# Patient Record
Sex: Male | Born: 1935 | Race: White | Hispanic: No | Smoking: Former smoker
Health system: Southern US, Community
[De-identification: ages and names within clinical notes are randomized; demographics above are authoritative.]

## PROBLEM LIST (undated history)

## (undated) DIAGNOSIS — Z972 Presence of dental prosthetic device (complete) (partial): Secondary | ICD-10-CM

## (undated) DIAGNOSIS — R0600 Dyspnea, unspecified: Secondary | ICD-10-CM

## (undated) DIAGNOSIS — I4891 Unspecified atrial fibrillation: Secondary | ICD-10-CM

## (undated) DIAGNOSIS — E119 Type 2 diabetes mellitus without complications: Secondary | ICD-10-CM

## (undated) DIAGNOSIS — J449 Chronic obstructive pulmonary disease, unspecified: Secondary | ICD-10-CM

## (undated) DIAGNOSIS — F419 Anxiety disorder, unspecified: Secondary | ICD-10-CM

## (undated) DIAGNOSIS — N189 Chronic kidney disease, unspecified: Secondary | ICD-10-CM

## (undated) DIAGNOSIS — N200 Calculus of kidney: Secondary | ICD-10-CM

## (undated) DIAGNOSIS — I471 Supraventricular tachycardia, unspecified: Secondary | ICD-10-CM

## (undated) DIAGNOSIS — Z923 Personal history of irradiation: Secondary | ICD-10-CM

## (undated) DIAGNOSIS — Z87442 Personal history of urinary calculi: Secondary | ICD-10-CM

## (undated) DIAGNOSIS — R350 Frequency of micturition: Secondary | ICD-10-CM

## (undated) DIAGNOSIS — E785 Hyperlipidemia, unspecified: Secondary | ICD-10-CM

## (undated) DIAGNOSIS — I10 Essential (primary) hypertension: Secondary | ICD-10-CM

## (undated) DIAGNOSIS — D500 Iron deficiency anemia secondary to blood loss (chronic): Principal | ICD-10-CM

## (undated) DIAGNOSIS — R7989 Other specified abnormal findings of blood chemistry: Secondary | ICD-10-CM

## (undated) DIAGNOSIS — G4733 Obstructive sleep apnea (adult) (pediatric): Secondary | ICD-10-CM

## (undated) DIAGNOSIS — R519 Headache, unspecified: Secondary | ICD-10-CM

## (undated) DIAGNOSIS — I2510 Atherosclerotic heart disease of native coronary artery without angina pectoris: Secondary | ICD-10-CM

## (undated) DIAGNOSIS — Z8709 Personal history of other diseases of the respiratory system: Secondary | ICD-10-CM

## (undated) DIAGNOSIS — D631 Anemia in chronic kidney disease: Secondary | ICD-10-CM

## (undated) DIAGNOSIS — E1142 Type 2 diabetes mellitus with diabetic polyneuropathy: Secondary | ICD-10-CM

## (undated) DIAGNOSIS — K219 Gastro-esophageal reflux disease without esophagitis: Secondary | ICD-10-CM

## (undated) DIAGNOSIS — G2581 Restless legs syndrome: Secondary | ICD-10-CM

## (undated) DIAGNOSIS — C8589 Other specified types of non-Hodgkin lymphoma, extranodal and solid organ sites: Secondary | ICD-10-CM

## (undated) DIAGNOSIS — M1990 Unspecified osteoarthritis, unspecified site: Secondary | ICD-10-CM

## (undated) DIAGNOSIS — R51 Headache: Secondary | ICD-10-CM

## (undated) DIAGNOSIS — I499 Cardiac arrhythmia, unspecified: Secondary | ICD-10-CM

## (undated) HISTORY — DX: Anemia in chronic kidney disease: D63.1

## (undated) HISTORY — DX: Dyspnea, unspecified: R06.00

## (undated) HISTORY — PX: ROTATOR CUFF REPAIR: SHX139

## (undated) HISTORY — DX: Unspecified osteoarthritis, unspecified site: M19.90

## (undated) HISTORY — PX: OTHER SURGICAL HISTORY: SHX169

## (undated) HISTORY — DX: Restless legs syndrome: G25.81

## (undated) HISTORY — DX: Calculus of kidney: N20.0

## (undated) HISTORY — PX: APPENDECTOMY: SHX54

## (undated) HISTORY — DX: Supraventricular tachycardia, unspecified: I47.10

## (undated) HISTORY — DX: Essential (primary) hypertension: I10

## (undated) HISTORY — DX: Type 2 diabetes mellitus without complications: E11.9

## (undated) HISTORY — DX: Other specified abnormal findings of blood chemistry: R79.89

## (undated) HISTORY — PX: CHOLECYSTECTOMY: SHX55

## (undated) HISTORY — PX: BACK SURGERY: SHX140

## (undated) HISTORY — DX: Gastro-esophageal reflux disease without esophagitis: K21.9

## (undated) HISTORY — DX: Supraventricular tachycardia: I47.1

## (undated) HISTORY — PX: MULTIPLE TOOTH EXTRACTIONS: SHX2053

## (undated) HISTORY — DX: Chronic kidney disease, unspecified: N18.9

## (undated) HISTORY — PX: CATARACT EXTRACTION, BILATERAL: SHX1313

## (undated) HISTORY — DX: Other specified types of non-hodgkin lymphoma, extranodal and solid organ sites: C85.89

## (undated) HISTORY — DX: Unspecified atrial fibrillation: I48.91

## (undated) HISTORY — DX: Hyperlipidemia, unspecified: E78.5

## (undated) HISTORY — DX: Obstructive sleep apnea (adult) (pediatric): G47.33

## (undated) HISTORY — DX: Atherosclerotic heart disease of native coronary artery without angina pectoris: I25.10

## (undated) HISTORY — PX: COLONOSCOPY: SHX174

## (undated) HISTORY — DX: Iron deficiency anemia secondary to blood loss (chronic): D50.0

## (undated) HISTORY — DX: Chronic obstructive pulmonary disease, unspecified: J44.9

## (undated) HISTORY — PX: EYE SURGERY: SHX253

---

## 1997-12-12 ENCOUNTER — Encounter: Admission: RE | Admit: 1997-12-12 | Discharge: 1998-03-12 | Payer: Self-pay | Admitting: Internal Medicine

## 1999-07-05 ENCOUNTER — Encounter: Payer: Self-pay | Admitting: Emergency Medicine

## 1999-07-06 ENCOUNTER — Encounter (INDEPENDENT_AMBULATORY_CARE_PROVIDER_SITE_OTHER): Payer: Self-pay | Admitting: Specialist

## 1999-07-06 ENCOUNTER — Encounter: Payer: Self-pay | Admitting: Emergency Medicine

## 1999-07-06 ENCOUNTER — Inpatient Hospital Stay (HOSPITAL_COMMUNITY): Admission: EM | Admit: 1999-07-06 | Discharge: 1999-07-08 | Payer: Self-pay | Admitting: Infectious Diseases

## 2000-12-14 ENCOUNTER — Encounter: Payer: Self-pay | Admitting: Emergency Medicine

## 2000-12-14 ENCOUNTER — Emergency Department (HOSPITAL_COMMUNITY): Admission: EM | Admit: 2000-12-14 | Discharge: 2000-12-14 | Payer: Self-pay | Admitting: Emergency Medicine

## 2001-05-27 ENCOUNTER — Encounter: Payer: Self-pay | Admitting: Sports Medicine

## 2001-05-27 ENCOUNTER — Ambulatory Visit (HOSPITAL_COMMUNITY): Admission: RE | Admit: 2001-05-27 | Discharge: 2001-05-27 | Payer: Self-pay | Admitting: Sports Medicine

## 2001-07-15 ENCOUNTER — Ambulatory Visit (HOSPITAL_BASED_OUTPATIENT_CLINIC_OR_DEPARTMENT_OTHER): Admission: RE | Admit: 2001-07-15 | Discharge: 2001-07-16 | Payer: Self-pay | Admitting: Orthopedic Surgery

## 2002-01-07 HISTORY — PX: CORONARY ANGIOPLASTY: SHX604

## 2002-03-24 ENCOUNTER — Inpatient Hospital Stay (HOSPITAL_COMMUNITY): Admission: EM | Admit: 2002-03-24 | Discharge: 2002-03-27 | Payer: Self-pay | Admitting: Emergency Medicine

## 2002-03-24 ENCOUNTER — Encounter: Payer: Self-pay | Admitting: Emergency Medicine

## 2002-04-19 ENCOUNTER — Encounter (HOSPITAL_COMMUNITY): Admission: RE | Admit: 2002-04-19 | Discharge: 2002-07-18 | Payer: Self-pay | Admitting: Internal Medicine

## 2002-07-20 ENCOUNTER — Encounter (HOSPITAL_COMMUNITY): Admission: RE | Admit: 2002-07-20 | Discharge: 2002-10-18 | Payer: Self-pay | Admitting: Cardiology

## 2002-11-08 ENCOUNTER — Encounter (HOSPITAL_COMMUNITY): Admission: RE | Admit: 2002-11-08 | Discharge: 2002-12-08 | Payer: Self-pay | Admitting: Cardiology

## 2003-02-24 ENCOUNTER — Ambulatory Visit (HOSPITAL_BASED_OUTPATIENT_CLINIC_OR_DEPARTMENT_OTHER): Admission: RE | Admit: 2003-02-24 | Discharge: 2003-02-24 | Payer: Self-pay | Admitting: Orthopedic Surgery

## 2003-02-24 ENCOUNTER — Ambulatory Visit (HOSPITAL_COMMUNITY): Admission: RE | Admit: 2003-02-24 | Discharge: 2003-02-24 | Payer: Self-pay | Admitting: Orthopedic Surgery

## 2003-03-06 ENCOUNTER — Emergency Department (HOSPITAL_COMMUNITY): Admission: AD | Admit: 2003-03-06 | Discharge: 2003-03-06 | Payer: Self-pay | Admitting: Family Medicine

## 2003-04-27 ENCOUNTER — Ambulatory Visit (HOSPITAL_BASED_OUTPATIENT_CLINIC_OR_DEPARTMENT_OTHER): Admission: RE | Admit: 2003-04-27 | Discharge: 2003-04-27 | Payer: Self-pay | Admitting: Orthopedic Surgery

## 2003-04-27 ENCOUNTER — Ambulatory Visit (HOSPITAL_COMMUNITY): Admission: RE | Admit: 2003-04-27 | Discharge: 2003-04-27 | Payer: Self-pay | Admitting: Orthopedic Surgery

## 2003-11-14 ENCOUNTER — Ambulatory Visit: Payer: Self-pay | Admitting: Internal Medicine

## 2003-12-19 ENCOUNTER — Ambulatory Visit: Payer: Self-pay | Admitting: Cardiology

## 2003-12-27 ENCOUNTER — Ambulatory Visit: Payer: Self-pay | Admitting: Internal Medicine

## 2004-01-05 ENCOUNTER — Ambulatory Visit: Payer: Self-pay

## 2004-01-05 ENCOUNTER — Ambulatory Visit: Payer: Self-pay | Admitting: Cardiology

## 2004-01-11 ENCOUNTER — Ambulatory Visit (HOSPITAL_BASED_OUTPATIENT_CLINIC_OR_DEPARTMENT_OTHER): Admission: RE | Admit: 2004-01-11 | Discharge: 2004-01-11 | Payer: Self-pay | Admitting: Cardiology

## 2004-01-11 ENCOUNTER — Ambulatory Visit: Payer: Self-pay | Admitting: Pulmonary Disease

## 2004-08-06 ENCOUNTER — Ambulatory Visit: Payer: Self-pay | Admitting: Internal Medicine

## 2004-09-20 ENCOUNTER — Ambulatory Visit: Payer: Self-pay | Admitting: Internal Medicine

## 2004-10-04 ENCOUNTER — Ambulatory Visit: Payer: Self-pay | Admitting: Internal Medicine

## 2004-10-05 ENCOUNTER — Ambulatory Visit: Payer: Self-pay

## 2004-10-12 ENCOUNTER — Ambulatory Visit: Payer: Self-pay | Admitting: Internal Medicine

## 2004-10-22 ENCOUNTER — Ambulatory Visit: Payer: Self-pay | Admitting: Internal Medicine

## 2004-10-30 ENCOUNTER — Emergency Department (HOSPITAL_COMMUNITY): Admission: EM | Admit: 2004-10-30 | Discharge: 2004-10-31 | Payer: Self-pay | Admitting: Emergency Medicine

## 2004-11-01 ENCOUNTER — Encounter: Admission: RE | Admit: 2004-11-01 | Discharge: 2004-11-01 | Payer: Self-pay | Admitting: Urology

## 2004-11-02 ENCOUNTER — Ambulatory Visit (HOSPITAL_BASED_OUTPATIENT_CLINIC_OR_DEPARTMENT_OTHER): Admission: RE | Admit: 2004-11-02 | Discharge: 2004-11-02 | Payer: Self-pay | Admitting: Urology

## 2004-11-08 ENCOUNTER — Ambulatory Visit (HOSPITAL_COMMUNITY): Admission: RE | Admit: 2004-11-08 | Discharge: 2004-11-08 | Payer: Self-pay | Admitting: Urology

## 2004-11-19 ENCOUNTER — Ambulatory Visit: Payer: Self-pay | Admitting: Internal Medicine

## 2004-11-28 ENCOUNTER — Ambulatory Visit: Payer: Self-pay | Admitting: Internal Medicine

## 2004-12-24 ENCOUNTER — Ambulatory Visit: Payer: Self-pay

## 2004-12-24 ENCOUNTER — Ambulatory Visit: Payer: Self-pay | Admitting: Cardiology

## 2005-01-15 ENCOUNTER — Ambulatory Visit: Payer: Self-pay | Admitting: Internal Medicine

## 2005-04-09 ENCOUNTER — Ambulatory Visit: Payer: Self-pay | Admitting: Internal Medicine

## 2005-05-27 ENCOUNTER — Ambulatory Visit: Payer: Self-pay | Admitting: Internal Medicine

## 2005-07-05 ENCOUNTER — Ambulatory Visit: Payer: Self-pay | Admitting: Cardiology

## 2005-07-22 ENCOUNTER — Ambulatory Visit: Payer: Self-pay | Admitting: Internal Medicine

## 2005-07-29 ENCOUNTER — Ambulatory Visit: Payer: Self-pay | Admitting: Internal Medicine

## 2005-08-28 ENCOUNTER — Ambulatory Visit: Payer: Self-pay | Admitting: Internal Medicine

## 2005-08-29 ENCOUNTER — Encounter: Admission: RE | Admit: 2005-08-29 | Discharge: 2005-08-29 | Payer: Self-pay | Admitting: Internal Medicine

## 2005-08-30 ENCOUNTER — Ambulatory Visit: Payer: Self-pay | Admitting: Internal Medicine

## 2005-09-03 ENCOUNTER — Ambulatory Visit: Payer: Self-pay | Admitting: Cardiology

## 2005-09-04 ENCOUNTER — Ambulatory Visit: Payer: Self-pay | Admitting: Cardiology

## 2005-09-12 ENCOUNTER — Ambulatory Visit: Payer: Self-pay | Admitting: Internal Medicine

## 2005-09-30 ENCOUNTER — Ambulatory Visit: Payer: Self-pay | Admitting: Internal Medicine

## 2005-11-25 ENCOUNTER — Ambulatory Visit: Payer: Self-pay | Admitting: Internal Medicine

## 2005-12-24 ENCOUNTER — Ambulatory Visit: Payer: Self-pay

## 2005-12-25 ENCOUNTER — Ambulatory Visit: Payer: Self-pay | Admitting: Cardiology

## 2005-12-27 ENCOUNTER — Ambulatory Visit: Payer: Self-pay | Admitting: Internal Medicine

## 2006-01-09 ENCOUNTER — Ambulatory Visit: Payer: Self-pay

## 2006-01-24 ENCOUNTER — Ambulatory Visit: Payer: Self-pay | Admitting: Cardiology

## 2006-02-05 ENCOUNTER — Ambulatory Visit: Payer: Self-pay | Admitting: Internal Medicine

## 2006-02-26 ENCOUNTER — Ambulatory Visit: Payer: Self-pay | Admitting: Internal Medicine

## 2006-03-06 ENCOUNTER — Ambulatory Visit: Payer: Self-pay | Admitting: Critical Care Medicine

## 2006-03-17 ENCOUNTER — Ambulatory Visit: Payer: Self-pay | Admitting: Critical Care Medicine

## 2006-03-25 ENCOUNTER — Ambulatory Visit: Payer: Self-pay | Admitting: Critical Care Medicine

## 2006-04-08 ENCOUNTER — Ambulatory Visit: Payer: Self-pay | Admitting: Critical Care Medicine

## 2006-05-01 ENCOUNTER — Ambulatory Visit: Payer: Self-pay | Admitting: Internal Medicine

## 2006-05-01 LAB — CONVERTED CEMR LAB: Hgb A1c MFr Bld: 7.9 % — ABNORMAL HIGH (ref 4.6–6.0)

## 2006-05-13 ENCOUNTER — Ambulatory Visit: Payer: Self-pay | Admitting: Cardiology

## 2006-05-13 ENCOUNTER — Inpatient Hospital Stay (HOSPITAL_COMMUNITY): Admission: EM | Admit: 2006-05-13 | Discharge: 2006-05-15 | Payer: Self-pay | Admitting: Family Medicine

## 2006-05-20 ENCOUNTER — Ambulatory Visit: Payer: Self-pay | Admitting: Cardiology

## 2006-05-29 ENCOUNTER — Ambulatory Visit: Payer: Self-pay | Admitting: Critical Care Medicine

## 2006-06-09 ENCOUNTER — Ambulatory Visit: Payer: Self-pay | Admitting: Cardiology

## 2006-06-09 LAB — CONVERTED CEMR LAB
BUN: 10 mg/dL (ref 6–23)
CO2: 24 meq/L (ref 19–32)
Calcium: 9.5 mg/dL (ref 8.4–10.5)
Chloride: 106 meq/L (ref 96–112)
Creatinine, Ser: 1 mg/dL (ref 0.4–1.5)
GFR calc Af Amer: 95 mL/min
GFR calc non Af Amer: 78 mL/min
Glucose, Bld: 267 mg/dL — ABNORMAL HIGH (ref 70–99)
Potassium: 4.6 meq/L (ref 3.5–5.1)
Sodium: 136 meq/L (ref 135–145)

## 2006-06-16 ENCOUNTER — Ambulatory Visit: Payer: Self-pay | Admitting: Internal Medicine

## 2006-06-20 ENCOUNTER — Ambulatory Visit: Payer: Self-pay | Admitting: Internal Medicine

## 2006-06-20 LAB — CONVERTED CEMR LAB
BUN: 11 mg/dL (ref 6–23)
CO2: 25 meq/L (ref 19–32)
Calcium: 9.5 mg/dL (ref 8.4–10.5)
Chloride: 107 meq/L (ref 96–112)
Creatinine, Ser: 1.1 mg/dL (ref 0.4–1.5)
GFR calc Af Amer: 85 mL/min
GFR calc non Af Amer: 70 mL/min
Glucose, Bld: 84 mg/dL (ref 70–99)
Potassium: 4.1 meq/L (ref 3.5–5.1)
Sodium: 137 meq/L (ref 135–145)

## 2006-07-01 DIAGNOSIS — E782 Mixed hyperlipidemia: Secondary | ICD-10-CM | POA: Insufficient documentation

## 2006-07-01 DIAGNOSIS — I10 Essential (primary) hypertension: Secondary | ICD-10-CM | POA: Insufficient documentation

## 2006-07-01 DIAGNOSIS — I2510 Atherosclerotic heart disease of native coronary artery without angina pectoris: Secondary | ICD-10-CM | POA: Insufficient documentation

## 2006-07-01 DIAGNOSIS — J449 Chronic obstructive pulmonary disease, unspecified: Secondary | ICD-10-CM

## 2006-07-01 DIAGNOSIS — M1990 Unspecified osteoarthritis, unspecified site: Secondary | ICD-10-CM | POA: Insufficient documentation

## 2006-07-08 ENCOUNTER — Ambulatory Visit: Payer: Self-pay | Admitting: Critical Care Medicine

## 2006-07-10 ENCOUNTER — Ambulatory Visit: Payer: Self-pay | Admitting: Internal Medicine

## 2006-07-16 ENCOUNTER — Ambulatory Visit: Payer: Self-pay | Admitting: Cardiology

## 2006-08-18 ENCOUNTER — Encounter: Payer: Self-pay | Admitting: Internal Medicine

## 2006-09-15 ENCOUNTER — Ambulatory Visit: Payer: Self-pay | Admitting: Internal Medicine

## 2006-10-01 ENCOUNTER — Ambulatory Visit: Payer: Self-pay | Admitting: Critical Care Medicine

## 2006-11-01 ENCOUNTER — Encounter: Admission: RE | Admit: 2006-11-01 | Discharge: 2006-11-01 | Payer: Self-pay | Admitting: Sports Medicine

## 2006-12-29 ENCOUNTER — Ambulatory Visit: Payer: Self-pay | Admitting: Internal Medicine

## 2006-12-29 LAB — CONVERTED CEMR LAB
ALT: 31 units/L (ref 0–53)
AST: 52 units/L — ABNORMAL HIGH (ref 0–37)
Albumin: 3.6 g/dL (ref 3.5–5.2)
Alkaline Phosphatase: 66 units/L (ref 39–117)
BUN: 11 mg/dL (ref 6–23)
Basophils Absolute: 0.2 10*3/uL — ABNORMAL HIGH (ref 0.0–0.1)
Basophils Relative: 2.5 % — ABNORMAL HIGH (ref 0.0–1.0)
Bilirubin Urine: NEGATIVE
Bilirubin, Direct: 0.2 mg/dL (ref 0.0–0.3)
CO2: 26 meq/L (ref 19–32)
Calcium: 9.4 mg/dL (ref 8.4–10.5)
Chloride: 104 meq/L (ref 96–112)
Cholesterol: 214 mg/dL (ref 0–200)
Creatinine, Ser: 1 mg/dL (ref 0.4–1.5)
Creatinine,U: 189.3 mg/dL
Direct LDL: 109.5 mg/dL
Eosinophils Absolute: 0.2 10*3/uL (ref 0.0–0.6)
Eosinophils Relative: 3.8 % (ref 0.0–5.0)
GFR calc Af Amer: 95 mL/min
GFR calc non Af Amer: 78 mL/min
Glucose, Bld: 188 mg/dL — ABNORMAL HIGH (ref 70–99)
Glucose, Urine, Semiquant: NEGATIVE
HCT: 40.6 % (ref 39.0–52.0)
HDL: 31.7 mg/dL — ABNORMAL LOW (ref >39.0)
Hemoglobin: 14.1 g/dL (ref 13.0–17.0)
Hgb A1c MFr Bld: 8.4 % — ABNORMAL HIGH (ref 4.6–6.0)
Ketones, urine, test strip: NEGATIVE
Lymphocytes Relative: 38.2 % (ref 12.0–46.0)
MCHC: 34.8 g/dL (ref 30.0–36.0)
MCV: 93.9 fL (ref 78.0–100.0)
Microalb Creat Ratio: 54.9 mg/g — ABNORMAL HIGH (ref 0.0–30.0)
Microalb, Ur: 10.4 mg/dL — ABNORMAL HIGH (ref 0.0–1.9)
Monocytes Absolute: 0.6 10*3/uL (ref 0.2–0.7)
Monocytes Relative: 9.9 % (ref 3.0–11.0)
Neutro Abs: 2.9 10*3/uL (ref 1.4–7.7)
Neutrophils Relative %: 45.6 % (ref 43.0–77.0)
Nitrite: NEGATIVE
Platelets: 210 10*3/uL (ref 150–400)
Potassium: 4.2 meq/L (ref 3.5–5.1)
RBC: 4.32 M/uL (ref 4.22–5.81)
RDW: 13.4 % (ref 11.5–14.6)
Sodium: 139 meq/L (ref 135–145)
Specific Gravity, Urine: >=1.03
TSH: 3.28 microintl units/mL (ref 0.35–5.50)
Total Bilirubin: 0.6 mg/dL (ref 0.3–1.2)
Total CHOL/HDL Ratio: 6.8
Total Protein: 6.5 g/dL (ref 6.0–8.3)
Triglycerides: 459 mg/dL (ref 0–149)
Urobilinogen, UA: 0.2
VLDL: 92 mg/dL — ABNORMAL HIGH (ref 0–40)
WBC Urine, dipstick: NEGATIVE
WBC: 6.3 10*3/uL (ref 4.5–10.5)
pH: 5.5

## 2007-01-05 ENCOUNTER — Ambulatory Visit: Payer: Self-pay

## 2007-01-05 ENCOUNTER — Ambulatory Visit: Payer: Self-pay | Admitting: Internal Medicine

## 2007-01-05 LAB — CONVERTED CEMR LAB
Cholesterol, target level: 200 mg/dL
HDL goal, serum: 40 mg/dL
LDL Goal: 100 mg/dL

## 2007-01-12 ENCOUNTER — Ambulatory Visit: Payer: Self-pay | Admitting: Critical Care Medicine

## 2007-01-12 DIAGNOSIS — K219 Gastro-esophageal reflux disease without esophagitis: Secondary | ICD-10-CM | POA: Insufficient documentation

## 2007-01-14 ENCOUNTER — Ambulatory Visit: Payer: Self-pay | Admitting: Cardiology

## 2007-01-20 ENCOUNTER — Encounter (HOSPITAL_COMMUNITY): Admission: RE | Admit: 2007-01-20 | Discharge: 2007-04-02 | Payer: Self-pay | Admitting: Critical Care Medicine

## 2007-01-28 ENCOUNTER — Telehealth: Payer: Self-pay | Admitting: Internal Medicine

## 2007-03-02 ENCOUNTER — Ambulatory Visit: Payer: Self-pay | Admitting: Internal Medicine

## 2007-03-02 LAB — CONVERTED CEMR LAB
ALT: 17 units/L (ref 0–53)
AST: 26 units/L (ref 0–37)
Albumin: 3.6 g/dL (ref 3.5–5.2)
Alkaline Phosphatase: 48 units/L (ref 39–117)
Bilirubin, Direct: 0.2 mg/dL (ref 0.0–0.3)
Cholesterol: 186 mg/dL (ref 0–200)
Direct LDL: 97.1 mg/dL
HDL: 32.3 mg/dL — ABNORMAL LOW (ref >39.0)
Total Bilirubin: 0.8 mg/dL (ref 0.3–1.2)
Total CHOL/HDL Ratio: 5.8
Total Protein: 6.7 g/dL (ref 6.0–8.3)
Triglycerides: 313 mg/dL (ref 0–149)
VLDL: 63 mg/dL — ABNORMAL HIGH (ref 0–40)

## 2007-03-12 ENCOUNTER — Telehealth: Payer: Self-pay | Admitting: Internal Medicine

## 2007-04-08 ENCOUNTER — Encounter (HOSPITAL_COMMUNITY): Admission: RE | Admit: 2007-04-08 | Discharge: 2007-07-07 | Payer: Self-pay | Admitting: Critical Care Medicine

## 2007-05-08 ENCOUNTER — Encounter: Payer: Self-pay | Admitting: Internal Medicine

## 2007-05-08 ENCOUNTER — Telehealth: Payer: Self-pay | Admitting: Internal Medicine

## 2007-05-11 ENCOUNTER — Telehealth: Payer: Self-pay | Admitting: Internal Medicine

## 2007-06-16 ENCOUNTER — Ambulatory Visit: Payer: Self-pay | Admitting: Internal Medicine

## 2007-06-16 LAB — CONVERTED CEMR LAB: Hgb A1c MFr Bld: 6.7 % — ABNORMAL HIGH (ref 4.6–6.0)

## 2007-06-24 ENCOUNTER — Ambulatory Visit: Payer: Self-pay | Admitting: Critical Care Medicine

## 2007-07-06 ENCOUNTER — Ambulatory Visit: Payer: Self-pay | Admitting: Cardiology

## 2007-07-08 ENCOUNTER — Encounter (HOSPITAL_COMMUNITY): Admission: RE | Admit: 2007-07-08 | Discharge: 2007-10-06 | Payer: Self-pay | Admitting: Critical Care Medicine

## 2007-07-15 ENCOUNTER — Encounter: Payer: Self-pay | Admitting: Internal Medicine

## 2007-07-15 ENCOUNTER — Ambulatory Visit: Payer: Self-pay

## 2007-07-15 ENCOUNTER — Encounter: Payer: Self-pay | Admitting: Cardiology

## 2007-07-16 ENCOUNTER — Ambulatory Visit: Payer: Self-pay

## 2007-07-17 ENCOUNTER — Telehealth: Payer: Self-pay | Admitting: Internal Medicine

## 2007-07-21 ENCOUNTER — Telehealth (INDEPENDENT_AMBULATORY_CARE_PROVIDER_SITE_OTHER): Payer: Self-pay | Admitting: *Deleted

## 2007-07-31 ENCOUNTER — Ambulatory Visit: Payer: Self-pay | Admitting: Cardiovascular Disease

## 2007-07-31 LAB — CONVERTED CEMR LAB
BUN: 18 mg/dL (ref 6–23)
Basophils Absolute: 0 10*3/uL (ref 0.0–0.1)
Basophils Relative: 0.4 % (ref 0.0–3.0)
CO2: 30 meq/L (ref 19–32)
CRP, High Sensitivity: 1 (ref 0.00–5.00)
Calcium: 10 mg/dL (ref 8.4–10.5)
Chloride: 98 meq/L (ref 96–112)
Creatinine, Ser: 1.2 mg/dL (ref 0.4–1.5)
Eosinophils Absolute: 0.2 10*3/uL (ref 0.0–0.7)
Eosinophils Relative: 2.7 % (ref 0.0–5.0)
GFR calc Af Amer: 77 mL/min
GFR calc non Af Amer: 63 mL/min
Glucose, Bld: 109 mg/dL — ABNORMAL HIGH (ref 70–99)
HCT: 40.5 % (ref 39.0–52.0)
Hemoglobin: 14.1 g/dL (ref 13.0–17.0)
Lymphocytes Relative: 37.7 % (ref 12.0–46.0)
MCHC: 34.8 g/dL (ref 30.0–36.0)
MCV: 92.8 fL (ref 78.0–100.0)
Monocytes Absolute: 0.6 10*3/uL (ref 0.1–1.0)
Monocytes Relative: 7.1 % (ref 3.0–12.0)
Neutro Abs: 4.1 10*3/uL (ref 1.4–7.7)
Neutrophils Relative %: 52.1 % (ref 43.0–77.0)
Platelets: 223 10*3/uL (ref 150–400)
Potassium: 4.1 meq/L (ref 3.5–5.1)
RBC: 4.36 M/uL (ref 4.22–5.81)
RDW: 12.9 % (ref 11.5–14.6)
Sodium: 136 meq/L (ref 135–145)
TSH: 2.74 microintl units/mL (ref 0.35–5.50)
Total CK: 109 units/L (ref 7–195)
WBC: 7.8 10*3/uL (ref 4.5–10.5)

## 2007-08-07 ENCOUNTER — Encounter: Payer: Self-pay | Admitting: Critical Care Medicine

## 2007-08-13 ENCOUNTER — Ambulatory Visit: Payer: Self-pay | Admitting: Cardiovascular Disease

## 2007-08-17 ENCOUNTER — Ambulatory Visit: Payer: Self-pay | Admitting: Internal Medicine

## 2007-08-28 ENCOUNTER — Ambulatory Visit: Payer: Self-pay | Admitting: Cardiovascular Disease

## 2007-08-28 LAB — CONVERTED CEMR LAB
BUN: 28 mg/dL — ABNORMAL HIGH (ref 6–23)
CO2: 29 meq/L (ref 19–32)
Calcium: 9.4 mg/dL (ref 8.4–10.5)
Chloride: 96 meq/L (ref 96–112)
Creatinine, Ser: 1.4 mg/dL (ref 0.4–1.5)
GFR calc Af Amer: 64 mL/min
GFR calc non Af Amer: 53 mL/min
Glucose, Bld: 195 mg/dL — ABNORMAL HIGH (ref 70–99)
Potassium: 4.3 meq/L (ref 3.5–5.1)
Sodium: 134 meq/L — ABNORMAL LOW (ref 135–145)

## 2007-09-01 ENCOUNTER — Encounter: Payer: Self-pay | Admitting: Critical Care Medicine

## 2007-09-02 ENCOUNTER — Ambulatory Visit: Payer: Self-pay | Admitting: Critical Care Medicine

## 2007-09-22 ENCOUNTER — Ambulatory Visit: Payer: Self-pay | Admitting: Cardiovascular Disease

## 2007-10-01 ENCOUNTER — Telehealth: Payer: Self-pay | Admitting: Internal Medicine

## 2007-10-08 ENCOUNTER — Encounter (HOSPITAL_COMMUNITY): Admission: RE | Admit: 2007-10-08 | Discharge: 2008-01-05 | Payer: Self-pay | Admitting: Critical Care Medicine

## 2007-10-23 ENCOUNTER — Ambulatory Visit: Payer: Self-pay | Admitting: Critical Care Medicine

## 2007-11-09 ENCOUNTER — Encounter: Payer: Self-pay | Admitting: Internal Medicine

## 2007-12-07 ENCOUNTER — Telehealth (INDEPENDENT_AMBULATORY_CARE_PROVIDER_SITE_OTHER): Payer: Self-pay | Admitting: *Deleted

## 2007-12-14 ENCOUNTER — Telehealth: Payer: Self-pay | Admitting: Internal Medicine

## 2007-12-22 ENCOUNTER — Telehealth: Payer: Self-pay | Admitting: Internal Medicine

## 2007-12-24 ENCOUNTER — Ambulatory Visit: Payer: Self-pay | Admitting: Internal Medicine

## 2008-01-08 ENCOUNTER — Encounter (HOSPITAL_COMMUNITY): Admission: RE | Admit: 2008-01-08 | Discharge: 2008-04-07 | Payer: Self-pay | Admitting: Critical Care Medicine

## 2008-01-15 ENCOUNTER — Telehealth: Payer: Self-pay | Admitting: Internal Medicine

## 2008-01-15 ENCOUNTER — Encounter: Payer: Self-pay | Admitting: Internal Medicine

## 2008-01-29 ENCOUNTER — Encounter: Payer: Self-pay | Admitting: Critical Care Medicine

## 2008-02-15 ENCOUNTER — Ambulatory Visit: Payer: Self-pay | Admitting: Critical Care Medicine

## 2008-03-28 ENCOUNTER — Encounter: Payer: Self-pay | Admitting: Cardiovascular Disease

## 2008-03-28 DIAGNOSIS — I119 Hypertensive heart disease without heart failure: Secondary | ICD-10-CM | POA: Insufficient documentation

## 2008-03-28 DIAGNOSIS — I2510 Atherosclerotic heart disease of native coronary artery without angina pectoris: Secondary | ICD-10-CM | POA: Insufficient documentation

## 2008-03-29 ENCOUNTER — Ambulatory Visit: Payer: Self-pay | Admitting: Cardiovascular Disease

## 2008-04-08 ENCOUNTER — Encounter (HOSPITAL_COMMUNITY): Admission: RE | Admit: 2008-04-08 | Discharge: 2008-07-07 | Payer: Self-pay | Admitting: Critical Care Medicine

## 2008-04-13 ENCOUNTER — Encounter (INDEPENDENT_AMBULATORY_CARE_PROVIDER_SITE_OTHER): Payer: Self-pay | Admitting: *Deleted

## 2008-05-16 ENCOUNTER — Ambulatory Visit: Payer: Self-pay | Admitting: Internal Medicine

## 2008-07-21 ENCOUNTER — Ambulatory Visit: Payer: Self-pay | Admitting: Family Medicine

## 2008-07-21 DIAGNOSIS — J36 Peritonsillar abscess: Secondary | ICD-10-CM | POA: Insufficient documentation

## 2008-07-21 DIAGNOSIS — J029 Acute pharyngitis, unspecified: Secondary | ICD-10-CM | POA: Insufficient documentation

## 2008-07-22 ENCOUNTER — Telehealth: Payer: Self-pay | Admitting: Family Medicine

## 2008-08-15 ENCOUNTER — Ambulatory Visit: Payer: Self-pay | Admitting: Internal Medicine

## 2008-08-19 LAB — CONVERTED CEMR LAB
BUN: 17 mg/dL (ref 6–23)
Basophils Absolute: 0 10*3/uL (ref 0.0–0.1)
Basophils Relative: 0.1 % (ref 0.0–3.0)
CO2: 28 meq/L (ref 19–32)
Calcium: 9.5 mg/dL (ref 8.4–10.5)
Chloride: 108 meq/L (ref 96–112)
Creatinine, Ser: 1 mg/dL (ref 0.4–1.5)
Eosinophils Absolute: 0.2 10*3/uL (ref 0.0–0.7)
Eosinophils Relative: 3.9 % (ref 0.0–5.0)
GFR calc non Af Amer: 77.73 mL/min (ref >60)
Glucose, Bld: 223 mg/dL — ABNORMAL HIGH (ref 70–99)
HCT: 37.7 % — ABNORMAL LOW (ref 39.0–52.0)
Hemoglobin: 13.1 g/dL (ref 13.0–17.0)
Hgb A1c MFr Bld: 11.6 % — ABNORMAL HIGH (ref 4.6–6.5)
Lymphocytes Relative: 36.4 % (ref 12.0–46.0)
Lymphs Abs: 2 10*3/uL (ref 0.7–4.0)
MCHC: 34.7 g/dL (ref 30.0–36.0)
MCV: 95.2 fL (ref 78.0–100.0)
Monocytes Absolute: 0.6 10*3/uL (ref 0.1–1.0)
Monocytes Relative: 9.9 % (ref 3.0–12.0)
Neutro Abs: 2.8 10*3/uL (ref 1.4–7.7)
Neutrophils Relative %: 49.7 % (ref 43.0–77.0)
Platelets: 185 10*3/uL (ref 150.0–400.0)
Potassium: 4.2 meq/L (ref 3.5–5.1)
RBC: 3.96 M/uL — ABNORMAL LOW (ref 4.22–5.81)
RDW: 13 % (ref 11.5–14.6)
Sodium: 141 meq/L (ref 135–145)
WBC: 5.6 10*3/uL (ref 4.5–10.5)

## 2008-09-15 ENCOUNTER — Ambulatory Visit: Payer: Self-pay | Admitting: Cardiovascular Disease

## 2008-10-19 ENCOUNTER — Ambulatory Visit: Payer: Self-pay | Admitting: Internal Medicine

## 2008-10-19 DIAGNOSIS — J0100 Acute maxillary sinusitis, unspecified: Secondary | ICD-10-CM | POA: Insufficient documentation

## 2008-11-07 ENCOUNTER — Ambulatory Visit: Payer: Self-pay | Admitting: Internal Medicine

## 2008-11-09 LAB — CONVERTED CEMR LAB
Cholesterol: 247 mg/dL — ABNORMAL HIGH (ref 0–200)
Direct LDL: 138.7 mg/dL
HDL: 28 mg/dL — ABNORMAL LOW (ref >39.00)
Total CHOL/HDL Ratio: 9
Triglycerides: 561 mg/dL — ABNORMAL HIGH (ref 0.0–149.0)
VLDL: 112.2 mg/dL — ABNORMAL HIGH (ref 0.0–40.0)

## 2008-12-07 ENCOUNTER — Ambulatory Visit: Payer: Self-pay | Admitting: Internal Medicine

## 2008-12-14 ENCOUNTER — Encounter (INDEPENDENT_AMBULATORY_CARE_PROVIDER_SITE_OTHER): Payer: Self-pay

## 2008-12-16 ENCOUNTER — Encounter (INDEPENDENT_AMBULATORY_CARE_PROVIDER_SITE_OTHER): Payer: Self-pay

## 2008-12-16 ENCOUNTER — Ambulatory Visit: Payer: Self-pay | Admitting: Gastroenterology

## 2008-12-28 ENCOUNTER — Ambulatory Visit: Payer: Self-pay | Admitting: Gastroenterology

## 2008-12-28 ENCOUNTER — Telehealth: Payer: Self-pay | Admitting: Internal Medicine

## 2009-01-03 ENCOUNTER — Encounter: Payer: Self-pay | Admitting: Gastroenterology

## 2009-01-04 ENCOUNTER — Encounter: Payer: Self-pay | Admitting: Cardiology

## 2009-01-04 DIAGNOSIS — I6529 Occlusion and stenosis of unspecified carotid artery: Secondary | ICD-10-CM | POA: Insufficient documentation

## 2009-01-05 ENCOUNTER — Ambulatory Visit: Payer: Self-pay

## 2009-01-05 ENCOUNTER — Encounter: Payer: Self-pay | Admitting: Internal Medicine

## 2009-01-23 ENCOUNTER — Telehealth: Payer: Self-pay | Admitting: Internal Medicine

## 2009-01-26 ENCOUNTER — Telehealth: Payer: Self-pay | Admitting: *Deleted

## 2009-02-16 ENCOUNTER — Telehealth: Payer: Self-pay | Admitting: Internal Medicine

## 2009-02-21 ENCOUNTER — Ambulatory Visit: Payer: Self-pay | Admitting: Internal Medicine

## 2009-02-21 LAB — CONVERTED CEMR LAB: Hgb A1c MFr Bld: 11.7 % — ABNORMAL HIGH (ref 4.6–6.5)

## 2009-02-27 ENCOUNTER — Ambulatory Visit: Payer: Self-pay | Admitting: Internal Medicine

## 2009-02-28 ENCOUNTER — Telehealth: Payer: Self-pay | Admitting: Internal Medicine

## 2009-03-24 ENCOUNTER — Telehealth: Payer: Self-pay | Admitting: Internal Medicine

## 2009-03-30 ENCOUNTER — Ambulatory Visit: Payer: Self-pay | Admitting: Internal Medicine

## 2009-04-18 ENCOUNTER — Ambulatory Visit: Payer: Self-pay | Admitting: Cardiovascular Disease

## 2009-05-08 ENCOUNTER — Ambulatory Visit: Payer: Self-pay | Admitting: Internal Medicine

## 2009-05-08 LAB — CONVERTED CEMR LAB: Hgb A1c MFr Bld: 8.6 % — ABNORMAL HIGH (ref 4.6–6.5)

## 2009-05-16 ENCOUNTER — Encounter: Payer: Self-pay | Admitting: Internal Medicine

## 2009-05-24 ENCOUNTER — Ambulatory Visit: Payer: Self-pay | Admitting: Internal Medicine

## 2009-05-29 ENCOUNTER — Telehealth: Payer: Self-pay | Admitting: Internal Medicine

## 2009-06-20 ENCOUNTER — Telehealth: Payer: Self-pay | Admitting: Internal Medicine

## 2009-07-08 ENCOUNTER — Encounter: Admission: RE | Admit: 2009-07-08 | Discharge: 2009-07-08 | Payer: Self-pay | Admitting: Sports Medicine

## 2009-07-14 ENCOUNTER — Telehealth: Payer: Self-pay | Admitting: *Deleted

## 2009-07-17 ENCOUNTER — Encounter: Admission: RE | Admit: 2009-07-17 | Discharge: 2009-07-17 | Payer: Self-pay | Admitting: Sports Medicine

## 2009-07-19 ENCOUNTER — Telehealth: Payer: Self-pay | Admitting: Internal Medicine

## 2009-07-31 ENCOUNTER — Encounter: Admission: RE | Admit: 2009-07-31 | Discharge: 2009-07-31 | Payer: Self-pay | Admitting: Sports Medicine

## 2009-08-01 ENCOUNTER — Telehealth: Payer: Self-pay | Admitting: *Deleted

## 2009-08-02 ENCOUNTER — Ambulatory Visit: Payer: Self-pay | Admitting: Family Medicine

## 2009-08-02 DIAGNOSIS — E669 Obesity, unspecified: Secondary | ICD-10-CM | POA: Insufficient documentation

## 2009-08-07 ENCOUNTER — Ambulatory Visit: Payer: Self-pay | Admitting: Internal Medicine

## 2009-08-07 LAB — CONVERTED CEMR LAB: Hgb A1c MFr Bld: 9.8 % — ABNORMAL HIGH (ref 4.6–6.5)

## 2009-08-14 ENCOUNTER — Ambulatory Visit: Payer: Self-pay | Admitting: Family Medicine

## 2009-08-14 DIAGNOSIS — G4700 Insomnia, unspecified: Secondary | ICD-10-CM | POA: Insufficient documentation

## 2009-08-14 DIAGNOSIS — R5383 Other fatigue: Secondary | ICD-10-CM

## 2009-08-14 DIAGNOSIS — R358 Other polyuria: Secondary | ICD-10-CM | POA: Insufficient documentation

## 2009-08-14 DIAGNOSIS — C44309 Unspecified malignant neoplasm of skin of other parts of face: Secondary | ICD-10-CM | POA: Insufficient documentation

## 2009-08-14 DIAGNOSIS — R252 Cramp and spasm: Secondary | ICD-10-CM | POA: Insufficient documentation

## 2009-08-14 DIAGNOSIS — C44300 Unspecified malignant neoplasm of skin of unspecified part of face: Secondary | ICD-10-CM | POA: Insufficient documentation

## 2009-08-14 DIAGNOSIS — R3589 Other polyuria: Secondary | ICD-10-CM | POA: Insufficient documentation

## 2009-08-14 DIAGNOSIS — R5381 Other malaise: Secondary | ICD-10-CM | POA: Insufficient documentation

## 2009-08-14 LAB — CONVERTED CEMR LAB
Creatinine,U: 162.6 mg/dL
Magnesium: 2.1 mg/dL (ref 1.5–2.5)
Microalb Creat Ratio: 4.2 mg/g (ref 0.0–30.0)
Microalb, Ur: 6.9 mg/dL — ABNORMAL HIGH (ref 0.0–1.9)

## 2009-08-15 LAB — CONVERTED CEMR LAB
ALT: 19 units/L (ref 0–53)
AST: 32 units/L (ref 0–37)
Albumin: 3.8 g/dL (ref 3.5–5.2)
Alkaline Phosphatase: 47 units/L (ref 39–117)
BUN: 18 mg/dL (ref 6–23)
Basophils Absolute: 0 10*3/uL (ref 0.0–0.1)
Basophils Relative: 0.6 % (ref 0.0–3.0)
Bilirubin, Direct: 0.1 mg/dL (ref 0.0–0.3)
CO2: 29 meq/L (ref 19–32)
Calcium: 9.7 mg/dL (ref 8.4–10.5)
Chloride: 103 meq/L (ref 96–112)
Cholesterol: 252 mg/dL — ABNORMAL HIGH (ref 0–200)
Creatinine, Ser: 1.3 mg/dL (ref 0.4–1.5)
Direct LDL: 167.1 mg/dL
Eosinophils Absolute: 0.1 10*3/uL (ref 0.0–0.7)
Eosinophils Relative: 1.7 % (ref 0.0–5.0)
GFR calc non Af Amer: 57.27 mL/min (ref >60)
Glucose, Bld: 132 mg/dL — ABNORMAL HIGH (ref 70–99)
HCT: 42.1 % (ref 39.0–52.0)
HDL: 38.2 mg/dL — ABNORMAL LOW (ref >39.00)
Hemoglobin: 14.1 g/dL (ref 13.0–17.0)
Lymphocytes Relative: 30.6 % (ref 12.0–46.0)
Lymphs Abs: 2.6 10*3/uL (ref 0.7–4.0)
MCHC: 33.6 g/dL (ref 30.0–36.0)
MCV: 95.9 fL (ref 78.0–100.0)
Monocytes Absolute: 0.7 10*3/uL (ref 0.1–1.0)
Monocytes Relative: 8.8 % (ref 3.0–12.0)
Neutro Abs: 4.9 10*3/uL (ref 1.4–7.7)
Neutrophils Relative %: 58.3 % (ref 43.0–77.0)
PSA: 0.87 ng/mL (ref 0.10–4.00)
Phosphorus: 2.7 mg/dL (ref 2.3–4.6)
Platelets: 233 10*3/uL (ref 150.0–400.0)
Potassium: 4.4 meq/L (ref 3.5–5.1)
RBC: 4.39 M/uL (ref 4.22–5.81)
RDW: 13.9 % (ref 11.5–14.6)
Sodium: 141 meq/L (ref 135–145)
TSH: 2.6 microintl units/mL (ref 0.35–5.50)
Total Bilirubin: 0.4 mg/dL (ref 0.3–1.2)
Total CHOL/HDL Ratio: 7
Total Protein: 6.7 g/dL (ref 6.0–8.3)
Triglycerides: 328 mg/dL — ABNORMAL HIGH (ref 0.0–149.0)
VLDL: 65.6 mg/dL — ABNORMAL HIGH (ref 0.0–40.0)
WBC: 8.4 10*3/uL (ref 4.5–10.5)

## 2009-09-12 ENCOUNTER — Encounter: Admission: RE | Admit: 2009-09-12 | Discharge: 2009-09-12 | Payer: Self-pay | Admitting: Sports Medicine

## 2009-09-19 ENCOUNTER — Ambulatory Visit: Payer: Self-pay | Admitting: Family Medicine

## 2009-09-19 DIAGNOSIS — M549 Dorsalgia, unspecified: Secondary | ICD-10-CM | POA: Insufficient documentation

## 2009-09-29 ENCOUNTER — Encounter: Payer: Self-pay | Admitting: Family Medicine

## 2009-10-10 ENCOUNTER — Telehealth: Payer: Self-pay | Admitting: Internal Medicine

## 2009-10-11 ENCOUNTER — Ambulatory Visit: Payer: Self-pay | Admitting: Family Medicine

## 2009-10-11 DIAGNOSIS — J209 Acute bronchitis, unspecified: Secondary | ICD-10-CM | POA: Insufficient documentation

## 2009-10-18 ENCOUNTER — Telehealth: Payer: Self-pay | Admitting: Internal Medicine

## 2009-11-16 ENCOUNTER — Ambulatory Visit: Payer: Self-pay | Admitting: Internal Medicine

## 2009-11-16 DIAGNOSIS — J019 Acute sinusitis, unspecified: Secondary | ICD-10-CM | POA: Insufficient documentation

## 2009-12-25 ENCOUNTER — Telehealth: Payer: Self-pay | Admitting: Internal Medicine

## 2010-01-17 ENCOUNTER — Other Ambulatory Visit: Payer: Self-pay | Admitting: Internal Medicine

## 2010-01-17 ENCOUNTER — Ambulatory Visit
Admission: RE | Admit: 2010-01-17 | Discharge: 2010-01-17 | Payer: Self-pay | Source: Home / Self Care | Attending: Internal Medicine | Admitting: Internal Medicine

## 2010-01-17 LAB — BASIC METABOLIC PANEL
BUN: 38 mg/dL — ABNORMAL HIGH (ref 6–23)
CO2: 23 mEq/L (ref 19–32)
Calcium: 10.3 mg/dL (ref 8.4–10.5)
Chloride: 99 mEq/L (ref 96–112)
Creatinine, Ser: 2.2 mg/dL — ABNORMAL HIGH (ref 0.4–1.5)
GFR: 31.17 mL/min — ABNORMAL LOW (ref >60.00)
Glucose, Bld: 309 mg/dL — ABNORMAL HIGH (ref 70–99)
Potassium: 5.1 mEq/L (ref 3.5–5.1)
Sodium: 134 mEq/L — ABNORMAL LOW (ref 135–145)

## 2010-01-17 LAB — HEMOGLOBIN A1C: Hgb A1c MFr Bld: 9.5 % — ABNORMAL HIGH (ref 4.6–6.5)

## 2010-01-18 ENCOUNTER — Encounter: Payer: Self-pay | Admitting: Internal Medicine

## 2010-01-28 ENCOUNTER — Encounter: Payer: Self-pay | Admitting: Sports Medicine

## 2010-02-04 LAB — CONVERTED CEMR LAB
ALT: 23 units/L (ref 0–53)
AST: 47 units/L — ABNORMAL HIGH (ref 0–37)
Albumin: 3.7 g/dL (ref 3.5–5.2)
Alkaline Phosphatase: 55 units/L (ref 39–117)
BUN: 13 mg/dL (ref 6–23)
Basophils Absolute: 0.1 10*3/uL (ref 0.0–0.1)
Basophils Relative: 1.1 % (ref 0.0–3.0)
Bilirubin, Direct: 0.1 mg/dL (ref 0.0–0.3)
CO2: 27 meq/L (ref 19–32)
Calcium: 9.7 mg/dL (ref 8.4–10.5)
Chloride: 106 meq/L (ref 96–112)
Cholesterol: 316 mg/dL — ABNORMAL HIGH (ref 0–200)
Creatinine, Ser: 1.1 mg/dL (ref 0.4–1.5)
Direct LDL: 194.6 mg/dL
Eosinophils Absolute: 0.2 10*3/uL (ref 0.0–0.7)
Eosinophils Relative: 3.4 % (ref 0.0–5.0)
GFR calc non Af Amer: 69.68 mL/min (ref >60)
Glucose, Bld: 199 mg/dL — ABNORMAL HIGH (ref 70–99)
HCT: 40.2 % (ref 39.0–52.0)
HDL: 27.4 mg/dL — ABNORMAL LOW (ref >39.00)
Hemoglobin: 14.1 g/dL (ref 13.0–17.0)
Lymphocytes Relative: 31.7 % (ref 12.0–46.0)
Lymphs Abs: 1.7 10*3/uL (ref 0.7–4.0)
MCHC: 35.2 g/dL (ref 30.0–36.0)
MCV: 93.8 fL (ref 78.0–100.0)
Monocytes Absolute: 0.4 10*3/uL (ref 0.1–1.0)
Monocytes Relative: 8.1 % (ref 3.0–12.0)
Neutro Abs: 3.1 10*3/uL (ref 1.4–7.7)
Neutrophils Relative %: 55.7 % (ref 43.0–77.0)
PSA: 1.05 ng/mL (ref 0.10–4.00)
Platelets: 198 10*3/uL (ref 150.0–400.0)
Potassium: 4.6 meq/L (ref 3.5–5.1)
RBC: 4.28 M/uL (ref 4.22–5.81)
RDW: 12.5 % (ref 11.5–14.6)
Sodium: 138 meq/L (ref 135–145)
TSH: 1.05 microintl units/mL (ref 0.35–5.50)
Total Bilirubin: 0.8 mg/dL (ref 0.3–1.2)
Total CHOL/HDL Ratio: 12
Total Protein: 7.4 g/dL (ref 6.0–8.3)
Triglycerides: 536 mg/dL — ABNORMAL HIGH (ref 0.0–149.0)
VLDL: 107.2 mg/dL — ABNORMAL HIGH (ref 0.0–40.0)
WBC: 5.5 10*3/uL (ref 4.5–10.5)

## 2010-02-06 NOTE — Progress Notes (Signed)
Summary: REQ FOR RX  Phone Note Call from Patient   Caller: Patient [phone] Reason for Call: Talk to Nurse, Talk to Doctor Summary of Call: Pt called to say that the card that he is using (for accu-ck aviva kit)  to obtain test strips indicates that the pt will need a RX for the test strips in order to obtain same at a discounted price "per pharmacy"..... Pt req that RX for test strips [facility].  Initial call taken by: Duanne Moron,  February 28, 2009 9:46 AM    New/Updated Medications: ACCU-CHEK AVIVA  STRP (GLUCOSE BLOOD) 1 once daily Prescriptions: ACCU-CHEK AVIVA  STRP (GLUCOSE BLOOD) 1 once daily  #100 x 6   Entered by:   Allyne Gee, LPN   Authorized by:   Ricard Dillon MD   Signed by:   Allyne Gee, LPN on 624THL   Method used:   [facility]        [facility] [phone][facility] (retail)       [address]       [place], [place]  [address]       Ph: [phone]       Fax: [phone]   RxID:   [id number]

## 2010-02-06 NOTE — Assessment & Plan Note (Signed)
Summary: 2 month rov/njr/pt rsc/cjr [facility] TO [facility]/NJR   Vital Signs:  Patient profile:   77 year old male Height:      70 inches (177.80 cm) Weight:      264.31 pounds (120.14 kg) O2 Sat:      96 % on Room air Temp:     98.6 degrees F (37.00 degrees C) oral Pulse rate:   88 / minute BP sitting:   118 / 78  (left arm) Cuff size:   large  Vitals Entered By: Gardenia Phlegm RMA (August 14, 2009 8:06 AM)  O2 Flow:  Room air CC: 1 week follow up/ CF Is Patient Diabetic? Yes   History of Present Illness: Patient in today for evaluation of multiple medical problems to establish care. He brings in his sugar log and his numbers are better than they had been but still the lowest number is 151 this am. Highest was in the 250s. He continues to c/o nocturia, no trouble during the day. Denies any dysuria, hematuria, abdominal pain. He has ongoing low back pain although it is better since he had 2 steroid epidurals recently. His pain still occurs but he has to be overexerting or sitting in an uncomfortable position for an extended period of time. No CP/palp/SOB/g/c/GI c/o. Has had a colonoscopy in past 5 years and denies any constipation or bloody stool. He does note some recent trouble with some increased cramping in his calves at night. He reports drinking adequate water during the day. C/O persistent trouble sleeping waking every 2 hours to urinate. He typically goes to bed at 11pm and is up at 3:30am to read the paper then has trouble sleeping again. He reports naping frequently during the day. He snores but not excessively, no witnessed apnea. He had a sleep study before but never slept so it was not helpful.  His wife's biggest concern is his recent irritability. He has been quick tempered and more tired recently. Difficulty concentrating at times. Patient had a BCC removed from left side of nose 2 months ago and has healed well, will see dermatology again in  months  Current Problems  (verified): 1)  Fatigue  (ICD-780.79) 2)  Insomnia Unspecified  (ICD-780.52) 3)  Basal Cell Carcinoma, Nose  (ICD-173.3) 4)  Leg Cramps, Idiopathic  (ICD-729.82) 5)  Polyuria  (ICD-788.42) 6)  Obesity  (ICD-278.00) 7)  Edema  (ICD-782.3) 8)  Carotid Artery Disease  (ICD-433.10) 9)  Acute Maxillary Sinusitis  (ICD-461.0) 10)  Peritonsillar Abscess  (ICD-475) 11)  Acute Pharyngitis  (ICD-462) 12)  Cad, Native Vessel  (ICD-414.01) 13)  Hypertension, Heart Controlled w/o Assoc CHF  (ICD-402.10) 14)  G E R D  (ICD-530.81) 15)  Diabetes Mellitus, Type II  (ICD-250.00) 16)  Preventive Health Care  (ICD-V70.0) 17)  Morbid Obesity  (ICD-278.01) 50)  Family History of Cad Male 1st Degree Relative <60  (ICD-V16.49) 19)  Hyperlipidemia  (ICD-272.4) 20)  COPD  (ICD-496) 21)  Family History of Cad Male 1st Degree Relative <50  (ICD-V17.3) 22)  Osteoarthritis  (ICD-715.90) 23)  Hypertension  (ICD-401.9) 24)  Coronary Artery Disease  (ICD-414.00)  Current Medications (verified): 1)  Dexilant 30 Mg Cpdr (Dexlansoprazole) .... One By Mouth Daily 2)  Gabapentin 300 Mg  Caps (Gabapentin) .... Two Times A Day 3)  Bayer Low Strength 81 Mg  Tbec (Aspirin) .... Once Daily 4)  Diphenhydramine Hcl 25 Mg  Caps (Diphenhydramine Hcl) .... As Needed 5)  Spironolactone 25 Mg  Tabs (Spironolactone) .Marland KitchenMarland KitchenMarland Kitchen  One By Mouth Daily 6)  Multivitamins   Caps (Multiple Vitamin) .Marland Kitchen.. 1 Once Daily 7)  Calcium 600/vitamin D 600-400 Mg-Unit  Tabs (Calcium Carbonate-Vitamin D) .Marland Kitchen.. 1 Once Daily 8)  Furosemide 40 Mg Tabs (Furosemide) .Marland Kitchen.. 1 By Mouth Daily As Needed 9)  Glimepiride 4 Mg Tabs (Glimepiride) .Marland Kitchen.. 1 Tab Once Daily 10)  Bystolic 5 Mg Tabs (Nebivolol Hcl) .Marland Kitchen.. 1 Once Daily 11)  Crestor 20 Mg Tabs (Rosuvastatin Calcium) .... One Every Monday and Friday 12)  Kombiglyze Xr 2.05-998 Mg Xr24h-Tab (Saxagliptin-Metformin) .Marland Kitchen.. 1 Twice A Day  ( Replaced The 1000/5) 13)  Accu-Chek Aviva  Strp (Glucose Blood) .Marland Kitchen.. 1 Three  Times A Day 14)  Lantus Solostar 100 Unit/ml Soln (Insulin Glargine) .... 25 U  in [place] Daily, May Increase By 2 Units Every 3 Days If Bs Remains Above 200 15)  Nitrostat 0.4 Mg Subl (Nitroglycerin) .Marland Kitchen.. 1 Tablet Under Tongue At Onset of Chest Pain; You May Repeat Every 5 Minutes For Up To 3 Doses. 16)  Accu-Chek Multiclix Lancets  Misc (Lancets) .Marland Kitchen.. 1 Once Daily 17)  Zolpidem Tartrate 5 Mg Tabs (Zolpidem Tartrate) .Marland Kitchen.. 1 Tab By Mouth At Bedtime As Needed Insomnia  Allergies (verified): 1)  ! Pcn 2)  ! Codeine  Past History:  Past medical, surgical, family and social histories (including risk factors) reviewed, and no changes noted (except as noted below).  Past Medical History: Reviewed history from 04/18/2009 and no changes required. Coronary artery disease, overlapping stents RCA. Last cath May 2008 with moderate disease LAD Hypertension Osteoarthritis-shoulder Restless leg Elevated lipids DM COPD Hyperlipidemia Nephrolithiasis SVT Dyspnea Diabetes mellitus, type II G E R D  Current Meds:  METFORMIN HCL 500 MG  TABS (METFORMIN HCL) two times a day PRAVACHOL 40 MG  TABS (PRAVASTATIN SODIUM) 1/2 tab by mouth once daily DUETACT 30-4 MG  TABS (PIOGLITAZONE HCL-GLIMEPIRIDE) once daily PROTONIX 40 MG  TBEC (PANTOPRAZOLE SODIUM) 1 once daily [BMN] LYRICA 75 MG  CAPS (PREGABALIN) two times a day BAYER LOW STRENGTH 81 MG  TBEC (ASPIRIN) once daily DIPHENHYDRAMINE HCL 25 MG  CAPS (DIPHENHYDRAMINE HCL) as needed SPIRONOLACTONE 25 MG  TABS (SPIRONOLACTONE) one by mouth daily Coreg 12.5mg  by mouth two times a day  ALLI 60 MG  CAPS (ORLISTAT) one before meals DARVOCET-N 100 100-650 MG  TABS (PROPOXYPHENE N-APAP) one by mouth q 6 hour prn  Allergies: PCN, CODEINE.    Past Surgical History: Reviewed history from 07/01/2006 and no changes required. Cholecystectomy  Family History: Reviewed history from 09/15/2006 and no changes required. Family History of CAD Male 1st  degree relative <50 Family History of CAD Male 1st degree relative <60 Father: deceased@94 , heart disease, migraines, PUD Mother: deceased@88 , Alzheimer's Dementia, heart disease Siblings: Sister: deceased@75 , heart disease, morbid obesity, arthritis Sister: 5,  arthritis, insomnia Brother: 24, smoker, arthritis, heart disease MGM: deceased in 15s, arthritis MGF: 76s, cerebral hemorrhage PGM: deceased young PGF: died in 74s, Alcohol abuse, migraines Children: Son: 83, sleep apnea, obese Daughter: 18, migraines, overweight Daughter: 51: thyroid disease,   Social History: Reviewed history from 04/18/2009 and no changes required. Retired Married Former Smoker,  quit 2 ppd 30 years ago, started after college No alcohol No illicit drugs  Review of Systems  The patient denies anorexia, fever, weight loss, weight gain, vision loss, decreased hearing, hoarseness, chest pain, syncope, dyspnea on exertion, peripheral edema, prolonged cough, headaches, hemoptysis, abdominal pain, melena, hematochezia, severe indigestion/heartburn, hematuria, incontinence, genital sores, muscle weakness, suspicious skin lesions, transient  blindness, difficulty walking, depression, unusual weight change, abnormal bleeding, and enlarged lymph nodes.    Physical Exam  General:  Well-developed,well-nourished,in no acute distress; alert,appropriate and cooperative throughout examination. Obese Head:  Normocephalic and atraumatic without obvious abnormalities. No apparent alopecia or balding. Eyes:  No corneal or conjunctival inflammation noted. EOMI.  Ears:  External ear exam shows no significant lesions or deformities.  Otoscopic examination reveals clear canals, tympanic membranes are intact bilaterally without bulging, retraction, inflammation or discharge. Hearing is grossly normal bilaterally. Nose:  External nasal examination shows no deformity or inflammation. Nasal mucosa are pink and moist without  lesions or exudates. Mouth:  Oral mucosa and oropharynx without lesions or exudates.  Teeth in good repair. Neck:  No deformities, masses, or tenderness noted. Lungs:  Normal respiratory effort, chest expands symmetrically. Lungs are clear to auscultation, no crackles or wheezes. Heart:  Normal rate and regular rhythm. S1 and S2 normal without gallop, murmur, click, rub or other extra sounds. Abdomen:  Bowel sounds positive,abdomen soft and non-tender without masses, organomegaly or hernias noted. Msk:  No deformity or scoliosis noted of thoracic or lumbar spine.   Pulses:  R and L carotid and posterior tibial pulses are full and equal bilaterally Extremities:  No clubbing, cyanosis, edema, or deformity noted with normal full range of motion of all joints.   Neurologic:  No cranial nerve deficits noted. Station and gait are normal. Plantar reflexes are down-going bilaterally. DTRs are symmetrical throughout. Sensory, motor and coordinative functions appear intact. Cervical Nodes:  No lymphadenopathy noted Psych:  Cognition and judgment appear intact. Alert and cooperative with normal attention span and concentration. No apparent delusions, illusions, hallucinations   Impression & Recommendations:  Problem # 1:  FATIGUE (ICD-780.79) Multifactorial, needs to consolidate sleep. Avoid getting up so much as night and napping during the day. He is asked to use Ambien only as needed due to feeling woozey on it but not really sleeping better. Consider a repeat sleep study which he declines today. Consider Citalopram for depression/irritability  Problem # 2:  OBESITY (ICD-278.00) Decrease by mouth intake, has nutrition appt next month, stay active  Problem # 3:  LEG CRAMPS, IDIOPATHIC (ICD-729.82)  Orders: TLB-Magnesium (Mg) (83735-MG) Venipuncture [id number]) Specimen Handling (99000) consider a Ca/Mag/Zn supplement  Problem # 4:  HYPERTENSION, HEART CONTROLLED W/O ASSOC CHF (ICD-402.10)  The  following medications were removed from the medication list:    Bystolic 5 Mg Tabs (Nebivolol hcl) .Marland Kitchen... 1 once daily His updated medication list for this problem includes:    Spironolactone 25 Mg Tabs (Spironolactone) ..... One by mouth daily    Furosemide 40 Mg Tabs (Furosemide) .Marland Kitchen... 1 by mouth daily as needed    Metoprolol Tartrate 50 Mg Tabs (Metoprolol tartrate) .Marland Kitchen... 1 tab by mouth once daily  Orders: TLB-Lipid Panel (80061-LIPID) TLB-Renal Function Panel (80069-RENAL) TLB-CBC Platelet - w/Differential (85025-CBCD) TLB-Hepatic/Liver Function Pnl (80076-HEPATIC) TLB-TSH (Thyroid Stimulating Hormone) (84443-TSH) Venipuncture [id number]) Specimen Handling (99000) Well controlled on meds  Problem # 5:  G E R D (ICD-530.81)  The following medications were removed from the medication list:    Dexilant 30 Mg Cpdr (Dexlansoprazole) ..... One by mouth daily His updated medication list for this problem includes:    Prilosec 20 Mg Cpdr (Omeprazole) .Marland Kitchen... 1 tab by mouth once daily Switch to Omeprazole for financial concerns  Orders: Prescription Created Electronically [phone])  Problem # 6:  DIABETES MELLITUS, TYPE II (ICD-250.00)  The following medications were removed from the medication list:  Kombiglyze Xr 2.05-998 Mg Xr24h-tab (Saxagliptin-metformin) .Marland Kitchen... 1 twice a day  ( replaced the 1000/5) His updated medication list for this problem includes:    Bayer Low Strength 81 Mg Tbec (Aspirin) ..... Once daily    Glimepiride 4 Mg Tabs (Glimepiride) .Marland Kitchen... 1 tab once daily    Lantus Solostar 100 Unit/ml Soln (Insulin glargine) .Marland Kitchen... 25 u  in abdomal  area daily, may increase by 2 units every 3 days if bs remains above 200    Kombiglyze Xr 05-998 Mg Xr24h-tab (Saxagliptin-metformin) .Marland Kitchen... 1 tab by mouth once daily  Orders: Venipuncture [id number]) Specimen Handling (99000) TLB-Microalbumin/Creat Ratio, Urine (82043-MALB) Prescription Created Electronically [phone])  Given samples of  Kombiglyze 05/998 by mouth once daily which he was already on #14 tabs given  Complete Medication List: 1)  Gabapentin 300 Mg Caps (Gabapentin) .... Two times a day 2)  Bayer Low Strength 81 Mg Tbec (Aspirin) .... Once daily 3)  Diphenhydramine Hcl 25 Mg Caps (Diphenhydramine hcl) .... As needed 4)  Spironolactone 25 Mg Tabs (Spironolactone) .... One by mouth daily 5)  Multivitamins Caps (Multiple vitamin) .Marland Kitchen.. 1 once daily 6)  Calcium 600/vitamin D 600-400 Mg-unit Tabs (Calcium carbonate-vitamin d) .Marland Kitchen.. 1 once daily 7)  Furosemide 40 Mg Tabs (Furosemide) .Marland Kitchen.. 1 by mouth daily as needed 8)  Glimepiride 4 Mg Tabs (Glimepiride) .Marland Kitchen.. 1 tab once daily 9)  Crestor 20 Mg Tabs (Rosuvastatin calcium) .... One every monday and friday 10)  Accu-chek Aviva Strp (Glucose blood) .Marland Kitchen.. 1 three times a day 11)  Lantus Solostar 100 Unit/ml Soln (Insulin glargine) .... 25 u  in abdomal  area daily, may increase by 2 units every 3 days if bs remains above 200 12)  Nitrostat 0.4 Mg Subl (Nitroglycerin) .Marland Kitchen.. 1 tablet under tongue at onset of chest pain; you may repeat every 5 minutes for up to 3 doses. 13)  Accu-chek Multiclix Lancets Misc (Lancets) .Marland Kitchen.. 1 once daily 14)  Zolpidem Tartrate 5 Mg Tabs (Zolpidem tartrate) .Marland Kitchen.. 1 tab by mouth at bedtime as needed insomnia 15)  Mobic 7.5 Mg Tabs (Meloxicam) .Marland Kitchen.. 1 tab by mouth once daily as needed pain with food 16)  Metoprolol Tartrate 50 Mg Tabs (Metoprolol tartrate) .Marland Kitchen.. 1 tab by mouth once daily 17)  Kombiglyze Xr 05-998 Mg Xr24h-tab (Saxagliptin-metformin) .Marland Kitchen.. 1 tab by mouth once daily 18)  Prilosec 20 Mg Cpdr (Omeprazole) .Marland Kitchen.. 1 tab by mouth once daily  Other Orders: TLB-PSA (Prostate Specific Antigen) (84153-PSA)  Patient Instructions: 1)  Please schedule a follow-up appointment in 1 month.  2)  Check your blood sugars regularly. If your readings are usually above:  or below 70 you should contact our office.  3)  Report any concerning symptoms or blood  sugars prior to next visit Prescriptions: PRILOSEC 20 MG CPDR (OMEPRAZOLE) 1 tab by mouth once daily  #30 x 5   Entered and Authorized by:   Penni Homans MD   Signed by:   Penni Homans MD on 08/14/2009   Method used:   Samples Given   RxID:   [id number] KOMBIGLYZE XR 05-998 MG XR24H-TAB (SAXAGLIPTIN-METFORMIN) 1 tab by mouth once daily  #14 x 0   Entered and Authorized by:   Penni Homans MD   Signed by:   Penni Homans MD on 08/14/2009   Method used:   Samples Given   RxID:   [id number] METOPROLOL TARTRATE 50 MG TABS (METOPROLOL TARTRATE) 1 tab by mouth once daily  #30 x 5   Entered and Authorized  by:   Penni Homans MD   Signed by:   Penni Homans MD on 08/14/2009   Method used:   [facility] to        [facility] [phone][facility]ail[facility]       [address]       [place], [place]  [address]       Ph: [phone]       Fax: [phone]   RxID:   [id number] MOBIC 7.5 MG TABS (MELOXICAM) 1 tab by mouth once daily as needed pain with food  #30 x 3   Entered and Authorized by:   Penni Homans MD   Signed by:   Penni Homans MD on 08/14/2009   Method used:   [facility]        [phone][facility] (retail[facility]       [address]       [place], [place]  [address]       Ph: [phone]       Fax: [phone]   RxID:   [id number]

## 2010-02-06 NOTE — Progress Notes (Signed)
Summary: samples  Phone Note Call from Patient   Summary of Call: Needs samples of kombiglyze.  [id number] Initial call taken by: Deanna Artis CMA,  July 19, 2009 8:29 AM  Follow-up for Phone Call        2 boxes given- all we had Follow-up by: Allyne Gee, LPN,  July 13, 624THL [id number] AM

## 2010-02-06 NOTE — Progress Notes (Signed)
  Phone Note Call from Patient   Reason for Call: Acute Illness Summary of Call: Pt would like MD to review medication? Pt states he feels run down still. Pt states MD went through medication before and eliminated some medications? Please advise? Initial call taken by: Gardenia Phlegm RMA,  October 18, 2009 10:09 AM  Follow-up for Phone Call        ov given to discuss Follow-up by: Allyne Gee, LPN,  October 12, 624THL 5:22 PM

## 2010-02-06 NOTE — Assessment & Plan Note (Signed)
Summary: cough and congestioni/bmw   Vital Signs:  Patient profile:   77 year old male Height:      70 inches (177.80 cm) Weight:      268 pounds (121.82 kg) O2 Sat:      97 % on Room air Temp:     97.8 degrees F (36.56 degrees C) oral Pulse rate:   121 / minute BP sitting:   150 / 78  (left arm) Cuff size:   large  Vitals Entered By: Gardenia Phlegm RMA (October 11, 2009 8:37 AM)  O2 Flow:  Room air CC: coughw/phlegm (greenish-gray) and congestion X8 days/ CF Is Patient Diabetic? Yes   History of Present Illness: Patient in today with day 8 of URI. Started with sore throat, runny eyes, cough productive of grey phlegm, PND, nasal congestion, malaise, myalgias, DOE worse. No f/c/CP/palp/diarrhea/n/v. Some anorexia/itchy eyes. Cough keeping him up at night. Does not want any med with codeine, oxycodone or hydrocodone in them they all make him feel funny headed. The sugars were improving nicely until he started to feel poorly. His sugars have been up over 200 again in the am since he got sick again.   Current Medications (verified): 1)  Gabapentin 300 Mg  Caps (Gabapentin) .... Two Times A Day 2)  Bayer Low Strength 81 Mg  Tbec (Aspirin) .... Once Daily 3)  Diphenhydramine Hcl 25 Mg  Caps (Diphenhydramine Hcl) .... As Needed 4)  Spironolactone 25 Mg  Tabs (Spironolactone) .... One By Mouth Daily 5)  Multivitamins   Caps (Multiple Vitamin) .Marland Kitchen.. 1 Once Daily 6)  Calcium 600/vitamin D 600-400 Mg-Unit  Tabs (Calcium Carbonate-Vitamin D) .Marland Kitchen.. 1 Once Daily 7)  Furosemide 40 Mg Tabs (Furosemide) .Marland Kitchen.. 1 By Mouth Daily As Needed 8)  Glimepiride 4 Mg Tabs (Glimepiride) .Marland Kitchen.. 1 Tab Once Daily 9)  Accu-Chek Aviva  Strp (Glucose Blood) .Marland Kitchen.. 1 Three Times A Day 10)  Lantus Solostar 100 Unit/ml Soln (Insulin Glargine) .... 25 U  in [place] 2 X Daily, May Increase By 2 Units Every 3 Days If Bs Remains Above 200 11)  Nitrostat 0.4 Mg Subl (Nitroglycerin) .Marland Kitchen.. 1 Tablet Under Tongue At Onset of  Chest Pain; You May Repeat Every 5 Minutes For Up To 3 Doses. 12)  Accu-Chek Multiclix Lancets  Misc (Lancets) .Marland Kitchen.. 1 Once Daily 13)  Zolpidem Tartrate 5 Mg Tabs (Zolpidem Tartrate) .Marland Kitchen.. 1 Tab By Mouth At Bedtime As Needed Insomnia 14)  Mobic 7.5 Mg Tabs (Meloxicam) .Marland Kitchen.. 1 Tab By Mouth Once Daily As Needed Pain With Food 15)  Metoprolol Tartrate 50 Mg Tabs (Metoprolol Tartrate) .Marland Kitchen.. 1 Tab By Mouth Once Daily 16)  Prilosec 20 Mg Cpdr (Omeprazole) .Marland Kitchen.. 1 Tab By Mouth Once Daily 17)  Metformin Hcl 500 Mg Xr24h-Tab (Metformin Hcl) .... 2 Tabs By Mouth Qhs 18)  Pravachol 20 Mg Tabs (Pravastatin Sodium) .Marland Kitchen.. 1 Tab By Mouth Qhs 19)  Lisinopril 5 Mg Tabs (Lisinopril) .Marland Kitchen.. 1 Tab By Mouth Daily 20)  Percocet 5-325 Mg Tabs (Oxycodone-Acetaminophen) .Marland Kitchen.. 1 Tab By Mouth Daily As Needed Pain 21)  Robitussin For Diabetics .... Q4 Hours  Allergies (verified): 1)  ! Pcn 2)  ! Codeine  Past History:  Past medical history reviewed for relevance to current acute and chronic problems. Social history (including risk factors) reviewed for relevance to current acute and chronic problems.  Past Medical History: Reviewed history from 04/18/2009 and no changes required. Coronary artery disease, overlapping stents RCA. Last cath May 2008 with  moderate disease LAD Hypertension Osteoarthritis-shoulder Restless leg Elevated lipids DM COPD Hyperlipidemia Nephrolithiasis SVT Dyspnea Diabetes mellitus, type II G E R D  Current Meds:  METFORMIN HCL 500 MG  TABS (METFORMIN HCL) two times a day PRAVACHOL 40 MG  TABS (PRAVASTATIN SODIUM) 1/2 tab by mouth once daily DUETACT 30-4 MG  TABS (PIOGLITAZONE HCL-GLIMEPIRIDE) once daily PROTONIX 40 MG  TBEC (PANTOPRAZOLE SODIUM) 1 once daily [BMN] LYRICA 75 MG  CAPS (PREGABALIN) two times a day BAYER LOW STRENGTH 81 MG  TBEC (ASPIRIN) once daily DIPHENHYDRAMINE HCL 25 MG  CAPS (DIPHENHYDRAMINE HCL) as needed SPIRONOLACTONE 25 MG  TABS (SPIRONOLACTONE) one by mouth  daily Coreg 12.5mg  by mouth two times a day  ALLI 60 MG  CAPS (ORLISTAT) one before meals DARVOCET-N 100 100-650 MG  TABS (PROPOXYPHENE N-APAP) one by mouth q 6 hour prn  Allergies: PCN, CODEINE.    Social History: Reviewed history from 08/14/2009 and no changes required. Retired Married Former Smoker,  quit 2 ppd 30 years ago, started after college No alcohol No illicit drugs  Review of Systems      See HPI  Physical Exam  General:  Well-developed,well-nourished,in no acute distress; alert,appropriate and cooperative throughout examination Head:  Normocephalic and atraumatic without obvious abnormalities. No apparent alopecia or balding. Ears:  Slight clear fluid behind b/l TMs Nose:  mucosal erythema and mucosal edema.   Mouth:  Oral mucosa and oropharynx without lesions or exudates.  Neck:  No deformities, masses, or tenderness noted. Lungs:  Normal respiratory effort, chest expands symmetrically. Lungs are clear to auscultation, no crackles or wheezes. Heart:  Normal rate and regular rhythm. S1 and S2 normal without gallop, murmur, click, rub or other extra sounds. Abdomen:  Bowel sounds positive,abdomen soft and non-tender without masses, organomegaly or hernias noted. Obese Extremities:  No clubbing, cyanosis, edema, or deformity noted with normal full range of motion of all joints.   Cervical Nodes:  No lymphadenopathy noted Psych:  Cognition and judgment appear intact. Alert and cooperative with normal attention span and concentration. No apparent delusions, illusions, hallucinations   Impression & Recommendations:  Problem # 1:  ACUTE BRONCHITIS (ICD-466.0)  His updated medication list for this problem includes:    Cipro 500 Mg Tabs (Ciprofloxacin hcl) .Marland Kitchen... 1 tab by mouth two times a day x 10 days    Mucinex 600 Mg Xr12h-tab (Guaifenesin) .Marland Kitchen... 1 tab by mouth two times a day x 10 days    Tessalon Perles 100 Mg Caps (Benzonatate) .Marland Kitchen... 1 cap by mouth three times a  day as needed cough Push fluids, Tylenol as needed for any discomfort and call if symptoms worsen  Problem # 2:  HYPERTENSION, HEART CONTROLLED W/O ASSOC CHF (ICD-402.10)  His updated medication list for this problem includes:    Spironolactone 25 Mg Tabs (Spironolactone) ..... One by mouth daily    Furosemide 40 Mg Tabs (Furosemide) .Marland Kitchen... 1 by mouth daily as needed    Metoprolol Tartrate 50 Mg Tabs (Metoprolol tartrate) .Marland Kitchen... 1 tab by mouth once daily    Lisinopril 5 Mg Tabs (Lisinopril) .Marland Kitchen... 1 tab by mouth daily Well controlled on current meds  Problem # 3:  DIABETES MELLITUS, TYPE II (ICD-250.00)  His updated medication list for this problem includes:    Bayer Low Strength 81 Mg Tbec (Aspirin) ..... Once daily    Glimepiride 4 Mg Tabs (Glimepiride) .Marland Kitchen... 1 tab once daily    Lantus Solostar 100 Unit/ml Soln (Insulin glargine) .Marland KitchenMarland KitchenMarland KitchenMarland Kitchen 30 u  in abdomal  area 2 x daily, may increase by 2 units every 3 days if bs remains above 200    Metformin Hcl 500 Mg Xr24h-tab (Metformin hcl) .Marland Kitchen... 1 tab by mouth qam and 2 qpm, increased until sugars improve    Lisinopril 5 Mg Tabs (Lisinopril) .Marland Kitchen... 1 tab by mouth daily  Complete Medication List: 1)  Gabapentin 300 Mg Caps (Gabapentin) .... Two times a day 2)  Bayer Low Strength 81 Mg Tbec (Aspirin) .... Once daily 3)  Diphenhydramine Hcl 25 Mg Caps (Diphenhydramine hcl) .... As needed 4)  Spironolactone 25 Mg Tabs (Spironolactone) .... One by mouth daily 5)  Multivitamins Caps (Multiple vitamin) .Marland Kitchen.. 1 once daily 6)  Calcium 600/vitamin D 600-400 Mg-unit Tabs (Calcium carbonate-vitamin d) .Marland Kitchen.. 1 once daily 7)  Furosemide 40 Mg Tabs (Furosemide) .Marland Kitchen.. 1 by mouth daily as needed 8)  Glimepiride 4 Mg Tabs (Glimepiride) .Marland Kitchen.. 1 tab once daily 9)  Accu-chek Aviva Strp (Glucose blood) .Marland Kitchen.. 1 three times a day 10)  Lantus Solostar 100 Unit/ml Soln (Insulin glargine) .... 30 u  in abdomal  area 2 x daily, may increase by 2 units every 3 days if bs remains  above 200 11)  Nitrostat 0.4 Mg Subl (Nitroglycerin) .Marland Kitchen.. 1 tablet under tongue at onset of chest pain; you may repeat every 5 minutes for up to 3 doses. 12)  Accu-chek Multiclix Lancets Misc (Lancets) .Marland Kitchen.. 1 once daily 13)  Zolpidem Tartrate 5 Mg Tabs (Zolpidem tartrate) .Marland Kitchen.. 1 tab by mouth at bedtime as needed insomnia 14)  Mobic 7.5 Mg Tabs (Meloxicam) .Marland Kitchen.. 1 tab by mouth once daily as needed pain with food 15)  Metoprolol Tartrate 50 Mg Tabs (Metoprolol tartrate) .Marland Kitchen.. 1 tab by mouth once daily 16)  Prilosec 20 Mg Cpdr (Omeprazole) .Marland Kitchen.. 1 tab by mouth once daily 17)  Metformin Hcl 500 Mg Xr24h-tab (Metformin hcl) .Marland Kitchen.. 1 tab by mouth qam and 2 qpm 18)  Pravachol 20 Mg Tabs (Pravastatin sodium) .Marland Kitchen.. 1 tab by mouth qhs 19)  Lisinopril 5 Mg Tabs (Lisinopril) .Marland Kitchen.. 1 tab by mouth daily 20)  Percocet 5-325 Mg Tabs (Oxycodone-acetaminophen) .Marland Kitchen.. 1 tab by mouth daily as needed pain 21)  Robitussin For Diabetics  .... Q4 hours 22)  Cipro 500 Mg Tabs (Ciprofloxacin hcl) .Marland Kitchen.. 1 tab by mouth two times a day x 10 days 23)  Mucinex 600 Mg Xr12h-tab (Guaifenesin) .Marland Kitchen.. 1 tab by mouth two times a day x 10 days 24)  Tessalon Perles 100 Mg Caps (Benzonatate) .Marland Kitchen.. 1 cap by mouth three times a day as needed cough  Patient Instructions: 1)  Take your antibiotic as prescribed until ALL of it is gone, but stop if you develop a rash or swelling and contact our office as soon as possible.  2)  Acute Bronchitis symptoms for less then 10 days are not  helped by antibiotics. Take over the counter cough medications. Call if no improvement in 5-7 days, sooner if increasing cough, fever, or new symptoms ( shortness of breath, chest pain) .  3)  Please schedule a follow-up appointment as needed if symptoms worsen or do not improve Prescriptions: TESSALON PERLES 100 MG CAPS (BENZONATATE) 1 cap by mouth three times a day as needed cough  #30 x 0   Entered and Authorized by:   Penni Homans MD   Signed by:   Penni Homans MD on  10/11/2009   Method used:   Electronically to        [facility]  [id number]* (retail)       [address]       [place], [place]  [address]       Ph: [phone]       Fax: [phone]   RxID:   [id number] METFORMIN HCL 500 MG XR24H-TAB (METFORMIN HCL) 2 tabs by mouth 1 tab by mouth qam and 2 qpm  #90 x 1   Entered and Authorized by:   Penni Homans MD   Signed by:   Penni Homans MD on 10/11/2009   Method used:   [facility]        [facility] [phone][facility] (retail)       [address]       [place], [place]  [address]       Ph: [phone]       Fax: [phone]   RxID:   [id number] CIPRO 500 MG TABS (CIPROFLOXACIN HCL) 1 tab by mouth two times a day x 10 days  #20 x 0   Entered and Authorized by:   Penni Homans MD   Signed by:   Penni Homans MD on 10/11/2009   Method used:   [facility]        [facility] [phone]* (retail)       [address]       [place], [place]  [address]       Ph: [phone]       Fax: [phone]   RxID:   [id number]

## 2010-02-06 NOTE — Assessment & Plan Note (Signed)
Summary: elevated blood sugar/bmw   Vital Signs:  Patient profile:   77 year old male Height:      70 inches (177.80 cm) Weight:      262.31 pounds (119.23 kg) O2 Sat:      95 % on Room air Temp:     98.6 degrees F (37.00 degrees C) oral Pulse rate:   73 / minute BP sitting:   132 / 74  (left arm) Cuff size:   large  Vitals Entered By: Gardenia Phlegm RMA (August 02, 2009 10:44 AM)  O2 Flow:  Room air CC: Elevated Blood Sugar/ pt states that since he started the Lantus the BG has been rising/ CF Is Patient Diabetic? Yes   History of Present Illness: Patient in today to have his blood sugars evaluated. He brings in a log which shows blood sugars between 440 and 550 over the past several weeks. He was placed on Lantus recently and intially he blood sugars improved greatly to the mid to hi 100 level but then worsened again. He acknowledges oor diet compliance eating large portions and too many carbs. Is struggling with polyuria and polydipsia. Denies CP/palp/SOB/f/c/fatigue/recent illlness. Is having trouble sleeping due to getting up every 2 hours to urinate  Current Medications (verified): 1)  Dexilant 30 Mg Cpdr (Dexlansoprazole) .... One By Mouth Daily 2)  Gabapentin 300 Mg  Caps (Gabapentin) .... Two Times A Day 3)  Bayer Low Strength 81 Mg  Tbec (Aspirin) .... Once Daily 4)  Diphenhydramine Hcl 25 Mg  Caps (Diphenhydramine Hcl) .... As Needed 5)  Spironolactone 25 Mg  Tabs (Spironolactone) .... One By Mouth Daily 6)  Multivitamins   Caps (Multiple Vitamin) .Marland Kitchen.. 1 Once Daily 7)  Calcium 600/vitamin D 600-400 Mg-Unit  Tabs (Calcium Carbonate-Vitamin D) .Marland Kitchen.. 1 Once Daily 8)  Furosemide 40 Mg Tabs (Furosemide) .Marland Kitchen.. 1 By Mouth Daily As Needed 9)  Glimepiride 4 Mg Tabs (Glimepiride) .Marland Kitchen.. 1 Tab Once Daily 10)  Bystolic 5 Mg Tabs (Nebivolol Hcl) .Marland Kitchen.. 1 Once Daily 11)  Crestor 20 Mg Tabs (Rosuvastatin Calcium) .... One Every Monday and Friday 12)  Kombiglyze Xr 2.05-998 Mg Xr24h-Tab  (Saxagliptin-Metformin) .Marland Kitchen.. 1 Twice A Day  ( Replaced The 1000/5) 13)  Accu-Chek Aviva  Strp (Glucose Blood) .Marland Kitchen.. 1 Three Times A Day 14)  Lantus Solostar 100 Unit/ml Soln (Insulin Glargine) .... 20 U  in Abdomal  Area Daily 15)  Nitrostat 0.4 Mg Subl (Nitroglycerin) .Marland Kitchen.. 1 Tablet Under Tongue At Onset of Chest Pain; You May Repeat Every 5 Minutes For Up To 3 Doses. 16)  Accu-Chek Multiclix Lancets  Misc (Lancets) .Marland Kitchen.. 1 Once Daily  Allergies (verified): 1)  ! Pcn 2)  ! Codeine  Past History:  Past medical history reviewed for relevance to current acute and chronic problems. Social history (including risk factors) reviewed for relevance to current acute and chronic problems.  Past Medical History: Reviewed history from 04/18/2009 and no changes required. Coronary artery disease, overlapping stents RCA. Last cath May 2008 with moderate disease LAD Hypertension Osteoarthritis-shoulder Restless leg Elevated lipids DM COPD Hyperlipidemia Nephrolithiasis SVT Dyspnea Diabetes mellitus, type II G E R D  Current Meds:  METFORMIN HCL 500 MG  TABS (METFORMIN HCL) two times a day PRAVACHOL 40 MG  TABS (PRAVASTATIN SODIUM) 1/2 tab by mouth once daily DUETACT 30-4 MG  TABS (PIOGLITAZONE HCL-GLIMEPIRIDE) once daily PROTONIX 40 MG  TBEC (PANTOPRAZOLE SODIUM) 1 once daily [BMN] LYRICA 75 MG  CAPS (PREGABALIN) two times a day BAYER  LOW STRENGTH 81 MG  TBEC (ASPIRIN) once daily DIPHENHYDRAMINE HCL 25 MG  CAPS (DIPHENHYDRAMINE HCL) as needed SPIRONOLACTONE 25 MG  TABS (SPIRONOLACTONE) one by mouth daily Coreg 12.5mg  by mouth two times a day  ALLI 60 MG  CAPS (ORLISTAT) one before meals DARVOCET-N 100 100-650 MG  TABS (PROPOXYPHENE N-APAP) one by mouth q 6 hour prn  Allergies: PCN, CODEINE.    Social History: Reviewed history from 04/18/2009 and no changes required. Retired Married Former Smoker No alcohol No illicit drugs  Review of Systems      See HPI  Physical  Exam  General:  Well-developed,well-nourished,in no acute distress; alert,appropriate and cooperative throughout examination. Obese Head:  Normocephalic and atraumatic without obvious abnormalities. No apparent alopecia or balding. Neck:  No deformities, masses, or tenderness noted. Lungs:  Normal respiratory effort, chest expands symmetrically. Lungs are clear to auscultation, no crackles or wheezes. Heart:  Normal rate and regular rhythm. S1 and S2 normal without gallop, click, rub or other extra sounds.grade 1 /6 systolic murmur.   Abdomen:  Bowel sounds positive,abdomen soft and non-tender without masses, organomegaly or hernias noted. Extremities:  No clubbing, cyanosis, edema, or deformity noted  Neurologic:  alert & oriented X3 and gait normal.   Psych:  Cognition and judgment appear intact. Alert and cooperative with normal attention span and concentration. No apparent delusions, illusions, hallucinations   Impression & Recommendations:  Problem # 1:  DIABETES MELLITUS, TYPE II (ICD-250.00)  His updated medication list for this problem includes:    Bayer Low Strength 81 Mg Tbec (Aspirin) ..... Once daily    Glimepiride 4 Mg Tabs (Glimepiride) .Marland Kitchen... 1 tab once daily    Kombiglyze Xr 2.05-998 Mg Xr24h-tab (Saxagliptin-metformin) .Marland Kitchen... 1 twice a day  ( replaced the 1000/5)    Lantus Solostar 100 Unit/ml Soln (Insulin glargine) .Marland Kitchen... 25 u  in abdomal  area daily, may increase by 2 units every 3 days if bs remains above 200  Orders: Nutrition Referral (Nutrition) Poorly controlled at present, increase Lantus as directed, avoid simple carbs eat small frequent meals with lean proteins and brown carbs, one per meal  Problem # 2:  HYPERTENSION, HEART CONTROLLED W/O ASSOC CHF (ICD-402.10)  His updated medication list for this problem includes:    Spironolactone 25 Mg Tabs (Spironolactone) ..... One by mouth daily    Furosemide 40 Mg Tabs (Furosemide) .Marland Kitchen... 1 by mouth daily as needed     Bystolic 5 Mg Tabs (Nebivolol hcl) .Marland Kitchen... 1 once daily  Orders: Nutrition Referral (Nutrition)  Complete Medication List: 1)  Dexilant 30 Mg Cpdr (Dexlansoprazole) .... One by mouth daily 2)  Gabapentin 300 Mg Caps (Gabapentin) .... Two times a day 3)  Bayer Low Strength 81 Mg Tbec (Aspirin) .... Once daily 4)  Diphenhydramine Hcl 25 Mg Caps (Diphenhydramine hcl) .... As needed 5)  Spironolactone 25 Mg Tabs (Spironolactone) .... One by mouth daily 6)  Multivitamins Caps (Multiple vitamin) .Marland Kitchen.. 1 once daily 7)  Calcium 600/vitamin D 600-400 Mg-unit Tabs (Calcium carbonate-vitamin d) .Marland Kitchen.. 1 once daily 8)  Furosemide 40 Mg Tabs (Furosemide) .Marland Kitchen.. 1 by mouth daily as needed 9)  Glimepiride 4 Mg Tabs (Glimepiride) .Marland Kitchen.. 1 tab once daily 10)  Bystolic 5 Mg Tabs (Nebivolol hcl) .Marland Kitchen.. 1 once daily 11)  Crestor 20 Mg Tabs (Rosuvastatin calcium) .... One every monday and friday 12)  Kombiglyze Xr 2.05-998 Mg Xr24h-tab (Saxagliptin-metformin) .Marland Kitchen.. 1 twice a day  ( replaced the 1000/5) 13)  Accu-chek Aviva Strp (Glucose blood) .Marland KitchenMarland KitchenMarland Kitchen  1 three times a day 14)  Lantus Solostar 100 Unit/ml Soln (Insulin glargine) .... 25 u  in abdomal  area daily, may increase by 2 units every 3 days if bs remains above 200 15)  Nitrostat 0.4 Mg Subl (Nitroglycerin) .Marland Kitchen.. 1 tablet under tongue at onset of chest pain; you may repeat every 5 minutes for up to 3 doses. 16)  Accu-chek Multiclix Lancets Misc (Lancets) .Marland Kitchen.. 1 once daily 17)  Zolpidem Tartrate 5 Mg Tabs (Zolpidem tartrate) .Marland Kitchen.. 1 tab by mouth at bedtime as needed insomnia  Patient Instructions: 1)  Please schedule an appointment with your primary doctor in :  .  2)  You need to lose weight. Consider a lower calorie diet and regular exercise.  3)  Check your blood sugars regularly. If your readings are usually above:  or below 70 you should contact our office.  4)  Avoid simple carbs, small frequent meals with increased vegetable,  brown carbs, lean proteins and see  Nutritionist for further dietary advice. 5)  Monitor blood sugars in am prior to eating and an hour after meals. 6)  Increase Lantus to 25 units tonight and then increase by 2 units every 3 days until blood sugars are below 200 or any concerns 7)  Drop off a copy of blood sugar readings next week or bring to visit for further instructions Prescriptions: ZOLPIDEM TARTRATE 5 MG TABS (ZOLPIDEM TARTRATE) 1 tab by mouth at bedtime as needed insomnia  #30 x 1   Entered and Authorized by:   Penni Homans MD   Signed by:   Penni Homans MD on 08/02/2009   Method used:   Print then Give to Patient   RxID:   [id number] LANTUS SOLOSTAR 100 UNIT/ML SOLN (INSULIN GLARGINE) 25 u  in abdomal  area daily, may increase by 2 units every 3 days if BS remains above 200  #5 x 1   Entered and Authorized by:   Penni Homans MD   Signed by:   Penni Homans MD on 08/02/2009   Method used:   [facility]        [id number]       [address]       [place], [place]  [address]       Ph: [phone]       Fax: [phone]   RxID:   [id number]

## 2010-02-06 NOTE — Progress Notes (Signed)
Summary: High Blood Sugar  Phone Note Call from Patient Call back at Home Phone [phone]   Caller: Patient Call For: Gary Dillon MD Reason for Call: Acute Illness Summary of Call: pt. is concerned about blood sugar levels states they have gone up to 580 and he's not sure what to do about it. Wants to come in when wife has appt. and see if Dr. Arnoldo Morale can talk to him a little during her appt. Initial call taken by: Betsy Pries,  August 01, 2009 8:54 AM  Follow-up for Phone Call        ov with dr blyth tomorrow at same time of wife  Follow-up by: Allyne Gee, LPN,  July 26, [id number] 075-GRM PM

## 2010-02-06 NOTE — Progress Notes (Signed)
Summary: REQ FOR RX  Phone Note Call from Patient   Caller: Patient Summary of Call: Pt called to adv that he needs a Rx for the round click device / multi-click lancet device that goes with the Accu-ck Aviva glucometer..... [facility] [place] [employer] [place].... Pt adv that he can be reached at [phone].  Initial call taken by: Duanne Moron,  May 29, 2009 10:08 AM    New/Updated Medications: ACCU-CHEK MULTICLIX LANCETS  MISC (LANCETS) 1 once daily Prescriptions: ACCU-CHEK MULTICLIX LANCETS  MISC (LANCETS) 1 once daily  #100 x 3   Entered by:   Allyne Gee, LPN   Authorized by:   Ricard Dillon MD   Signed by:   Allyne Gee, LPN on 075-GRM   Method used:   [facility] to        [facility] [phone]* (retail)       [address]       [place], [place]  [address]       Ph: [phone]       Fax: [phone]   RxID:   [id number]

## 2010-02-06 NOTE — Medication Information (Signed)
Summary: Rx for Diabetic Shoes  Rx for Diabetic Shoes   Imported By: Laural Benes 05/22/2009 15:37:52  _____________________________________________________________________  External Attachment:    Type:   Image     Comment:   External Document

## 2010-02-06 NOTE — Assessment & Plan Note (Signed)
Summary: 3 month roa/bmw   Vital Signs:  Patient profile:   77 year old male Height:      70 inches Weight:      271 pounds BMI:     39.03 Temp:     98.2 degrees F oral Pulse rate:   76 / minute Resp:     14 per minute BP sitting:   120 / 70  (left arm) Cuff size:   large  Vitals Entered By: Allyne Gee, LPN (May 18, 624THL 624THL PM) CC: roa labs- questioning how to take kombiglyze-has been taking twice a day, Hypertension Management, Type 2 diabetes mellitus follow-up   Primary Care Provider:  Ricard Dillon MD  CC:  roa labs- questioning how to take kombiglyze-has been taking twice a day, Hypertension Management, and Type 2 diabetes mellitus follow-up.  History of Present Illness: the pt's wife has been ill and he has not "eaten correctly" monitering A1c and the elevation is persistant weight is going up  Type 2 Diabetes Mellitus Follow-Up      This is a 77 year old man who presents for Type 2 diabetes mellitus follow-up.  The patient denies polyuria, polydipsia, blurred vision, self managed hypoglycemia, hypoglycemia requiring help, weight loss, weight gain, and numbness of extremities.  Since the last visit the patient reports poor dietary compliance.  The patient has been measuring capillary blood glucose before breakfast.  Since the last visit, the patient reports having had eye care by an ophthalmologist.    Hypertension History:      He denies headache, chest pain, palpitations, dyspnea with exertion, orthopnea, PND, peripheral edema, visual symptoms, neurologic problems, syncope, and side effects from treatment.        Positive major cardiovascular risk factors include male age 75 years old or older, diabetes, hyperlipidemia, and hypertension.  Negative major cardiovascular risk factors include non-tobacco-user status.        Positive history for target organ damage include ASHD (either angina/prior MI/prior CABG) and peripheral vascular disease.  Further assessment for  target organ damage reveals no history of stroke/TIA.     Preventive Screening-Counseling & Management  Alcohol-Tobacco     Smoking Status: quit  Problems Prior to Update: 1)  Edema  (ICD-782.3) 2)  Carotid Artery Disease  (ICD-433.10) 3)  Acute Maxillary Sinusitis  (ICD-461.0) 4)  Peritonsillar Abscess  (ICD-475) 5)  Acute Pharyngitis  (ICD-462) 6)  Cad, Native Vessel  (ICD-414.01) 7)  Hypertension, Heart Controlled w/o Assoc CHF  (ICD-402.10) 8)  G E R D  (ICD-530.81) 9)  Diabetes Mellitus, Type II  (ICD-250.00) 10)  Preventive Health Care  (ICD-V70.0) 11)  Morbid Obesity  (ICD-278.01) 12)  Family History of Cad Male 1st Degree Relative <60  (ICD-V16.49) 13)  Hyperlipidemia  (ICD-272.4) 14)  COPD  (ICD-496) 15)  Family History of Cad Male 1st Degree Relative <50  (ICD-V17.3) 16)  Osteoarthritis  (ICD-715.90) 17)  Hypertension  (ICD-401.9) 18)  Coronary Artery Disease  (ICD-414.00)  Current Problems (verified): 1)  Edema  (ICD-782.3) 2)  Carotid Artery Disease  (ICD-433.10) 3)  Acute Maxillary Sinusitis  (ICD-461.0) 4)  Peritonsillar Abscess  (ICD-475) 5)  Acute Pharyngitis  (ICD-462) 6)  Cad, Native Vessel  (ICD-414.01) 7)  Hypertension, Heart Controlled w/o Assoc CHF  (ICD-402.10) 8)  G E R D  (ICD-530.81) 9)  Diabetes Mellitus, Type II  (ICD-250.00) 10)  Preventive Health Care  (ICD-V70.0) 11)  Morbid Obesity  (ICD-278.01) 12)  Family History of Cad Male 1st  Degree Relative <60  (ICD-V16.49) 13)  Hyperlipidemia  (ICD-272.4) 14)  COPD  (ICD-496) 15)  Family History of Cad Male 1st Degree Relative <50  (ICD-V17.3) 16)  Osteoarthritis  (ICD-715.90) 17)  Hypertension  (ICD-401.9) 18)  Coronary Artery Disease  (ICD-414.00)  Medications Prior to Update: 1)  Dexilant 30 Mg Cpdr (Dexlansoprazole) .... One By Mouth Daily 2)  Gabapentin 300 Mg  Caps (Gabapentin) .... Two Times A Day 3)  Bayer Low Strength 81 Mg  Tbec (Aspirin) .... Once Daily 4)  Diphenhydramine Hcl  25 Mg  Caps (Diphenhydramine Hcl) .... As Needed 5)  Spironolactone 25 Mg  Tabs (Spironolactone) .... One By Mouth Daily 6)  Multivitamins   Caps (Multiple Vitamin) .Marland Kitchen.. 1 Once Daily 7)  Calcium 600/vitamin D 600-400 Mg-Unit  Tabs (Calcium Carbonate-Vitamin D) .Marland Kitchen.. 1 Once Daily 8)  Furosemide 40 Mg Tabs (Furosemide) .Marland Kitchen.. 1 By Mouth Daily As Needed 9)  Glimepiride 4 Mg Tabs (Glimepiride) .Marland Kitchen.. 1 Tab Once Daily 10)  Bystolic 5 Mg Tabs (Nebivolol Hcl) .Marland Kitchen.. 1 Once Daily 11)  Crestor 20 Mg Tabs (Rosuvastatin Calcium) .... One Every Monday and Friday 12)  Kombiglyze Xr 2.05-998 Mg Xr24h-Tab (Saxagliptin-Metformin) .... One By Mouth Daily ( Replaced The 1000/5) 13)  Accu-Chek Aviva  Strp (Glucose Blood) .Marland Kitchen.. 1 Three Times A Day 14)  Lantus Solostar 100 Unit/ml Soln (Insulin Glargine) .... 20 U  in Abdomal  Area Daily 15)  Nitrostat 0.4 Mg Subl (Nitroglycerin) .Marland Kitchen.. 1 Tablet Under Tongue At Onset of Chest Pain; You May Repeat Every 5 Minutes For Up To 3 Doses.  Current Medications (verified): 1)  Dexilant 30 Mg Cpdr (Dexlansoprazole) .... One By Mouth Daily 2)  Gabapentin 300 Mg  Caps (Gabapentin) .... Two Times A Day 3)  Bayer Low Strength 81 Mg  Tbec (Aspirin) .... Once Daily 4)  Diphenhydramine Hcl 25 Mg  Caps (Diphenhydramine Hcl) .... As Needed 5)  Spironolactone 25 Mg  Tabs (Spironolactone) .... One By Mouth Daily 6)  Multivitamins   Caps (Multiple Vitamin) .Marland Kitchen.. 1 Once Daily 7)  Calcium 600/vitamin D 600-400 Mg-Unit  Tabs (Calcium Carbonate-Vitamin D) .Marland Kitchen.. 1 Once Daily 8)  Furosemide 40 Mg Tabs (Furosemide) .Marland Kitchen.. 1 By Mouth Daily As Needed 9)  Glimepiride 4 Mg Tabs (Glimepiride) .Marland Kitchen.. 1 Tab Once Daily 10)  Bystolic 5 Mg Tabs (Nebivolol Hcl) .Marland Kitchen.. 1 Once Daily 11)  Crestor 20 Mg Tabs (Rosuvastatin Calcium) .... One Every Monday and Friday 12)  Kombiglyze Xr 2.05-998 Mg Xr24h-Tab (Saxagliptin-Metformin) .Marland Kitchen.. 1 Twice A Day  ( Replaced The 1000/5) 13)  Accu-Chek Aviva  Strp (Glucose Blood) .Marland Kitchen.. 1  Three Times A Day 14)  Lantus Solostar 100 Unit/ml Soln (Insulin Glargine) .... 20 U  in Abdomal  Area Daily 15)  Nitrostat 0.4 Mg Subl (Nitroglycerin) .Marland Kitchen.. 1 Tablet Under Tongue At Onset of Chest Pain; You May Repeat Every 5 Minutes For Up To 3 Doses.  Allergies (verified): 1)  ! Pcn 2)  ! Codeine  Past History:  Family History: Last updated: 09/15/2006 Family History of CAD Male 1st degree relative <50 Family History of CAD Male 1st degree relative <60  Social History: Last updated: 04/18/2009 Retired Married Former Smoker No alcohol No illicit drugs  Risk Factors: Smoking Status: quit (05/24/2009)  Past medical, surgical, family and social histories (including risk factors) reviewed, and no changes noted (except as noted below).  Past Medical History: Reviewed history from 04/18/2009 and no changes required. Coronary artery disease, overlapping stents RCA. Last cath May  2008 with moderate disease LAD Hypertension Osteoarthritis-shoulder Restless leg Elevated lipids DM COPD Hyperlipidemia Nephrolithiasis SVT Dyspnea Diabetes mellitus, type II G E R D  Current Meds:  METFORMIN HCL 500 MG  TABS (METFORMIN HCL) two times a day PRAVACHOL 40 MG  TABS (PRAVASTATIN SODIUM) 1/2 tab by mouth once daily DUETACT 30-4 MG  TABS (PIOGLITAZONE HCL-GLIMEPIRIDE) once daily PROTONIX 40 MG  TBEC (PANTOPRAZOLE SODIUM) 1 once daily [BMN] LYRICA 75 MG  CAPS (PREGABALIN) two times a day BAYER LOW STRENGTH 81 MG  TBEC (ASPIRIN) once daily DIPHENHYDRAMINE HCL 25 MG  CAPS (DIPHENHYDRAMINE HCL) as needed SPIRONOLACTONE 25 MG  TABS (SPIRONOLACTONE) one by mouth daily Coreg 12.5mg  by mouth two times a day  ALLI 60 MG  CAPS (ORLISTAT) one before meals DARVOCET-N 100 100-650 MG  TABS (PROPOXYPHENE N-APAP) one by mouth q 6 hour prn  Allergies: PCN, CODEINE.    Past Surgical History: Reviewed history from 07/01/2006 and no changes required. Cholecystectomy  Family History: Reviewed  history from 09/15/2006 and no changes required. Family History of CAD Male 1st degree relative <50 Family History of CAD Male 1st degree relative <60  Social History: Reviewed history from 04/18/2009 and no changes required. Retired Married Former Smoker No alcohol No illicit drugs  Review of Systems  The patient denies anorexia, fever, weight loss, weight gain, vision loss, decreased hearing, hoarseness, chest pain, syncope, dyspnea on exertion, peripheral edema, prolonged cough, headaches, hemoptysis, abdominal pain, melena, hematochezia, severe indigestion/heartburn, hematuria, incontinence, genital sores, muscle weakness, suspicious skin lesions, transient blindness, difficulty walking, depression, unusual weight change, abnormal bleeding, enlarged lymph nodes, angioedema, and breast masses.    Physical Exam  General:  patient is alert nontoxic in appearance Head:  normocephalic.   Eyes:  pupils equal and pupils round.   Ears:  R ear normal and L ear normal.   Nose:  no external deformity and no nasal discharge.   Mouth:  pharynx pink and moist.   Neck:  supple with tender anterior cervical adenopathy right greater than left. Lungs:  normal respiratory effort and no wheezes.   Heart:  normal rate and regular rhythm.   Abdomen:  soft and non-tender.   Msk:  normal ROM and no joint warmth.   Pulses:  R and L carotid,radial,femoral,dorsalis pedis and posterior tibial pulses are full and equal bilaterally Extremities:  No clubbing, cyanosis, edema, or deformity noted with normal full range of motion of all joints.     Impression & Recommendations:  Problem # 1:  DIABETES MELLITUS, TYPE II (ICD-250.00) moived the a1c 3 points with the additijon of the kombiglaze discussion of carbohydrate limiting diet His updated medication list for this problem includes:    Bayer Low Strength 81 Mg Tbec (Aspirin) ..... Once daily    Glimepiride 4 Mg Tabs (Glimepiride) .Marland Kitchen... 1 tab once  daily    Kombiglyze Xr 2.05-998 Mg Xr24h-tab (Saxagliptin-metformin) .Marland Kitchen... 1 twice a day  ( replaced the 1000/5)    Lantus Solostar 100 Unit/ml Soln (Insulin glargine) .Marland Kitchen... 20 u  in abdomal  area daily  Labs Reviewed: Creat: 1.0 (08/15/2008)    Reviewed HgBA1c results: 8.6 (05/08/2009)  11.7 (02/21/2009)  Problem # 2:  MORBID OBESITY (ICD-278.01) if the weight loss would occur then we could reach goal  Problem # 3:  COPD (ICD-496) stable  Problem # 4:  G E R D (ICD-530.81)  His updated medication list for this problem includes:    Dexilant 30 Mg Cpdr (Dexlansoprazole) ..... One by mouth  daily  Labs Reviewed: Hgb: 13.1 (08/15/2008)   Hct: 37.7 (08/15/2008)  Complete Medication List: 1)  Dexilant 30 Mg Cpdr (Dexlansoprazole) .... One by mouth daily 2)  Gabapentin 300 Mg Caps (Gabapentin) .... Two times a day 3)  Bayer Low Strength 81 Mg Tbec (Aspirin) .... Once daily 4)  Diphenhydramine Hcl 25 Mg Caps (Diphenhydramine hcl) .... As needed 5)  Spironolactone 25 Mg Tabs (Spironolactone) .... One by mouth daily 6)  Multivitamins Caps (Multiple vitamin) .Marland Kitchen.. 1 once daily 7)  Calcium 600/vitamin D 600-400 Mg-unit Tabs (Calcium carbonate-vitamin d) .Marland Kitchen.. 1 once daily 8)  Furosemide 40 Mg Tabs (Furosemide) .Marland Kitchen.. 1 by mouth daily as needed 9)  Glimepiride 4 Mg Tabs (Glimepiride) .Marland Kitchen.. 1 tab once daily 10)  Bystolic 5 Mg Tabs (Nebivolol hcl) .Marland Kitchen.. 1 once daily 11)  Crestor 20 Mg Tabs (Rosuvastatin calcium) .... One every monday and friday 12)  Kombiglyze Xr 2.05-998 Mg Xr24h-tab (Saxagliptin-metformin) .Marland Kitchen.. 1 twice a day  ( replaced the 1000/5) 13)  Accu-chek Aviva Strp (Glucose blood) .Marland Kitchen.. 1 three times a day 14)  Lantus Solostar 100 Unit/ml Soln (Insulin glargine) .... 20 u  in abdomal  area daily 15)  Nitrostat 0.4 Mg Subl (Nitroglycerin) .Marland Kitchen.. 1 tablet under tongue at onset of chest pain; you may repeat every 5 minutes for up to 3 doses.  Hypertension Assessment/Plan:      The patient's  hypertensive risk group is category C: Target organ damage and/or diabetes.  Today's blood pressure is 120/70.  His blood pressure goal is < 130/80.  Patient Instructions: 1)  "dr oz"   HCG diet low in carbohydrates 2)  carbs are generally the breads, rice, pototaoes and pasta 3)  ( white stuff) 4)  Please schedule a follow-up appointment in 2 months. 5)  HbgA1C prior to visit, ICD-9:250.00

## 2010-02-06 NOTE — Progress Notes (Signed)
Summary: REQ FOR RX  Phone Note Call from Patient   Caller: Spouse  [phone] Summary of Call: Pts wife called to request that a med be sent into pharmacy to help her husbands sxs:  Pt c/o  cough, congestion, greenish/yellow mucus, ST.... Pt has tried OTC meds (Tussin for diabetics) w/ no relief.... Pt has labwork appt in the morning and a f/u appt on 10/17 .... Offered OV but pts wife adv they would like to wait and see if Dr Charlett Blake will send in a Rx to help pts sxs since pt already has appt scheduled.... [place].  Initial call taken by: Duanne Moron,  October 10, 2009 8:50 AM  Follow-up for Phone Call        Probably needs to come in and be seen by PMD if possible or [place] if unable to be seen by PMD Follow-up by: Penni Homans MD,  October 10, 2009 4:51 PM  Additional Follow-up for Phone Call Additional follow up Details #1::        ov givenm with dr Charlett Blake for in the am Additional Follow-up by: Allyne Gee, LPN,  October  4, 624THL 5:35 PM

## 2010-02-06 NOTE — Progress Notes (Signed)
Summary: head cold  Phone Note Call from Patient Call back at Home Phone [phone]   Caller: vm Call For: Gary Yates Reason for Call: Acute Illness, Referral Summary of Call: Tremendous head cold for a week.  Taking Mucinex DM.  Blowing white mucus head & coughing up yellowish mucus.  What to do? Initial call taken by: Shelbie Hutching, RN,  January 23, 2009 9:35 AM  Follow-up for Phone Call        salt water spray- this last about a week to 2 weeks per dr Vicente Serene dr Arnoldo Morale Follow-up by: Allyne Gee, LPN,  January 17, 624THL 9:55 AM

## 2010-02-06 NOTE — Assessment & Plan Note (Signed)
Summary: f10m    Visit Type:  Follow-up Primary Provider:  Ricard Dillon MD  CC:  no compliants.  History of Present Illness: 77 yo WM with history of HTN, hyperlipidemia, morbid obesity, DM, CAD and obstructive lung disease here for routine cardiac follow-up. He tells me that he has been doing well from a CV standpoint. He has only been taking Lasix occasionally and notes some mild lower extremity swelling. He has had no chest pain or change in baseline exertional dyspnea. He also describes left sided chest pain when he moves to his side. No exertional chest pain. He has been taking his Crestor two times per week.   Lipids reviewed: 11/10:   Total chol:  247 HDL:  28   LDL:139  TG:  >500  Current Medications (verified): 1)  Dexilant 30 Mg Cpdr (Dexlansoprazole) .... One By Mouth Daily 2)  Gabapentin 300 Mg  Caps (Gabapentin) .... Two Times A Day 3)  Bayer Low Strength 81 Mg  Tbec (Aspirin) .... Once Daily 4)  Diphenhydramine Hcl 25 Mg  Caps (Diphenhydramine Hcl) .... As Needed 5)  Spironolactone 25 Mg  Tabs (Spironolactone) .... One By Mouth Daily 6)  Multivitamins   Caps (Multiple Vitamin) .Marland Kitchen.. 1 Once Daily 7)  Calcium 600/vitamin D 600-400 Mg-Unit  Tabs (Calcium Carbonate-Vitamin D) .Marland Kitchen.. 1 Once Daily 8)  Furosemide 40 Mg Tabs (Furosemide) .Marland Kitchen.. 1 By Mouth Daily As Needed 9)  Glimepiride 4 Mg Tabs (Glimepiride) .Marland Kitchen.. 1 Tab Once Daily 10)  Bystolic 5 Mg Tabs (Nebivolol Hcl) .Marland Kitchen.. 1 Once Daily 11)  Crestor 20 Mg Tabs (Rosuvastatin Calcium) .... One Every Monday and Friday 12)  Kombiglyze Xr 2.05-998 Mg Xr24h-Tab (Saxagliptin-Metformin) .... One By Mouth Daily ( Replaced The 1000/5) 13)  Accu-Chek Aviva  Strp (Glucose Blood) .Marland Kitchen.. 1 Three Times A Day 14)  Lantus Solostar 100 Unit/ml Soln (Insulin Glargine) .... 20 U  in Abdomal  Area Daily  Allergies: 1)  ! Pcn 2)  ! Codeine  Past History:  Past Medical History: Coronary artery disease, overlapping stents RCA. Last cath May 2008 with  moderate disease LAD Hypertension Osteoarthritis-shoulder Restless leg Elevated lipids DM COPD Hyperlipidemia Nephrolithiasis SVT Dyspnea Diabetes mellitus, type II G E R D  Current Meds:  METFORMIN HCL 500 MG  TABS (METFORMIN HCL) two times a day PRAVACHOL 40 MG  TABS (PRAVASTATIN SODIUM) 1/2 tab by mouth once daily DUETACT 30-4 MG  TABS (PIOGLITAZONE HCL-GLIMEPIRIDE) once daily PROTONIX 40 MG  TBEC (PANTOPRAZOLE SODIUM) 1 once daily [BMN] LYRICA 75 MG  CAPS (PREGABALIN) two times a day BAYER LOW STRENGTH 81 MG  TBEC (ASPIRIN) once daily DIPHENHYDRAMINE HCL 25 MG  CAPS (DIPHENHYDRAMINE HCL) as needed SPIRONOLACTONE 25 MG  TABS (SPIRONOLACTONE) one by mouth daily Coreg 12.5mg  by mouth two times a day  ALLI 60 MG  CAPS (ORLISTAT) one before meals DARVOCET-N 100 100-650 MG  TABS (PROPOXYPHENE N-APAP) one by mouth q 6 hour prn  Allergies: PCN, CODEINE.    Social History: Reviewed history from 09/15/2006 and no changes required. Retired Married Former Smoker No alcohol No illicit drugs  Review of Systems       The patient complains of leg swelling.  The patient denies fatigue, malaise, fever, weight gain/loss, vision loss, decreased hearing, hoarseness, chest pain, palpitations, shortness of breath, prolonged cough, wheezing, sleep apnea, coughing up blood, abdominal pain, blood in stool, nausea, vomiting, diarrhea, heartburn, incontinence, blood in urine, muscle weakness, joint pain, rash, skin lesions, headache, fainting, dizziness, depression, anxiety,  enlarged lymph nodes, easy bruising or bleeding, and environmental allergies.    Vital Signs:  Patient profile:   77 year old male Height:      70 inches Weight:      267 pounds BMI:     38.45 Pulse rate:   73 / minute Resp:     14 per minute BP sitting:   120 / 70  (left arm)  Vitals Entered By: Burnett Kanaris (April 18, 2009 1:11 PM)  Physical Exam  General:  General: Well developed, well nourished, NAD HEENT:  OP clear, mucus membranes moist Psychiatric: Mood and affect normal Neck: No JVD, no carotid bruits, no thyromegaly, no lymphadenopathy. Lungs:Clear bilaterally, no wheezes, rhonci, crackles CV: RRR with soft systolic  murmur. No gallops rubs Abdomen: soft, NT, ND, BS present Extremities: Trace bilateral lower ext edema, pulses 2+ bilateral DP/PT.    Cardiac Cath  Procedure date:  05/14/2006  Findings:      RESULTS:  LEFT MAIN CORONARY:  The left main coronary was free of  significant disease.   LEFT ANTERIOR DESCENDING ARTERY:  The left anterior descending artery  gave rise to two large diagonal branches and a septal perforator.  The  LAD actually trifurcated into the two diagonal branches and the LAD  proper.  None of these reached the apex, and the apex was supplied by  the right coronary artery.  There was 50% narrowing in the proximal LAD  before the first two diagonal branches.  There was 50-70% stenosis in  the proximal to mid-LAD after the two diagonal branches.   THE CIRCUMFLEX ARTERY:  The circumflex artery gave rise to two small  marginal branches and atrial branch and a posterolateral branch.  These  vessels were free of significant disease.   THE RIGHT CORONARY ARTERY:  The right coronary artery was a moderate-  sized vessel that gave rise to a right ventricle branch, posterior  descending branch and two posterolateral branches.  This was a large  vessel that was free of major obstruction with irregularities.  There  was less than 10% narrowing at the tandem overlapping stents in the mid-  right coronary.  EKG  Procedure date:  04/18/2009  Findings:      NSR, rate 73 bpm. RBBB. LAFB.   Impression & Recommendations:  Problem # 1:  CAD, NATIVE VESSEL (ICD-414.01) Stable. No chest pain. Continue ASA, beta blocker, statin. I would recommend that he begin taking his statin on a daily basis.  Will have him review this with Dr. Arnoldo Morale.   His updated medication  list for this problem includes:    Bayer Low Strength 81 Mg Tbec (Aspirin) ..... Once daily    Bystolic 5 Mg Tabs (Nebivolol hcl) .Marland Kitchen... 1 once daily  Problem # 2:  HYPERTENSION, HEART CONTROLLED W/O ASSOC CHF (ICD-402.10) Well controlled. No change in therapy.   His updated medication list for this problem includes:    Bayer Low Strength 81 Mg Tbec (Aspirin) ..... Once daily    Spironolactone 25 Mg Tabs (Spironolactone) ..... One by mouth daily    Furosemide 40 Mg Tabs (Furosemide) .Marland Kitchen... 1 by mouth daily as needed    Bystolic 5 Mg Tabs (Nebivolol hcl) .Marland Kitchen... 1 once daily  Orders: EKG w/ Interpretation (93000)  Problem # 3:  CAROTID ARTERY DISEASE (ICD-433.10) Mild disease by dopplers 12/10. Repeat in 2 years.   His updated medication list for this problem includes:    Bayer Low Strength 81 Mg Tbec (Aspirin) .Marland KitchenMarland KitchenMarland KitchenMarland Kitchen  Once daily  Problem # 4:  HYPERLIPIDEMIA (ICD-272.4) Followed by Dr. Arnoldo Morale. Would recommend daily statin if he can tolerate.   His updated medication list for this problem includes:    Crestor 20 Mg Tabs (Rosuvastatin calcium) ..... One every monday and friday  Problem # 5:  EDEMA (ICD-782.3) Likely dependent. I have advised him to cut back on his salt intake and elevate his legs when possible. He will also begin taking 20 mg of Lasix daily and see if this helps his mild edema.   Patient Instructions: 1)  Your physician recommends that you schedule a follow-up appointment in: 6 months 2)  Your physician recommends that you continue on your current medications as directed. Please refer to the Current Medication list given to you today.

## 2010-02-06 NOTE — Letter (Signed)
Summary: [facility]   Imported By: Laural Benes 10/11/2009 15:51:33  _____________________________________________________________________  External Attachment:    Type:   Image     Comment:   External Document

## 2010-02-06 NOTE — Assessment & Plan Note (Signed)
Summary: discuss changeing meds due to fatigue/bmw wife rsc/njr/pt res...   Vital Signs:  Patient profile:   77 year old male Height:      70 inches Weight:      268 pounds BMI:     38.59 Temp:     98.4 degrees F oral Pulse rate:   80 / minute Resp:     14 per minute BP sitting:   140 / 76  (left arm) Cuff size:   large  Vitals Entered By: Allyne Gee, LPN (November 10, 624THL 12:22 PM) CC: c/o cough, cold and congestion-- wants to discuss meds and possibly discontinue so me-, Hypertension Management Is Patient Diabetic? Yes Did you bring your meter with you today? No   Primary Care Provider:  Ricard Dillon MD  CC:  c/o cough, cold and congestion-- wants to discuss meds and possibly discontinue so me-, and Hypertension Management.  History of Present Illness: URI  and sinusitis treated in October and still feeling sick cough, sneezing with productioive cough and sligh wheezing no current fever  concerned about medications and  wants a [place] review started on lisinopril and developed cough... hx of prior reaction  Hypertension History:      Positive major cardiovascular risk factors include male age 90 years old or older, diabetes, hyperlipidemia, and hypertension.  Negative major cardiovascular risk factors include non-tobacco-user status.        Positive history for target organ damage include ASHD (either angina/prior MI/prior CABG) and peripheral vascular disease.  Further assessment for target organ damage reveals no history of stroke/TIA.    Preventive Screening-Counseling & Management  Alcohol-Tobacco     Smoking Status: quit  Problems Prior to Update: 1)  Acute Bronchitis  (ICD-466.0) 2)  Back Pain  (ICD-724.5) 3)  Fatigue  (ICD-780.79) 4)  Insomnia Unspecified  (ICD-780.52) 5)  Basal Cell Carcinoma, Nose  (ICD-173.3) 6)  Leg Cramps, Idiopathic  (ICD-729.82) 7)  Polyuria  (ICD-788.42) 8)  Obesity  (ICD-278.00) 9)  Edema  (ICD-782.3) 10)  Carotid  Artery Disease  (ICD-433.10) 11)  Acute Maxillary Sinusitis  (ICD-461.0) 12)  Peritonsillar Abscess  (ICD-475) 13)  Acute Pharyngitis  (ICD-462) 14)  Cad, Native Vessel  (ICD-414.01) 15)  Hypertension, Heart Controlled w/o Assoc CHF  (ICD-402.10) 16)  G E R D  (ICD-530.81) 17)  Diabetes Mellitus, Type II  (ICD-250.00) 18)  Preventive Health Care  (ICD-V70.0) 19)  Morbid Obesity  (ICD-278.01) 31)  Family History of Cad Male 1st Degree Relative <60  (ICD-V16.49) 21)  Hyperlipidemia  (ICD-272.4) 22)  COPD  (ICD-496) 23)  Family History of Cad Male 1st Degree Relative <50  (ICD-V17.3) 24)  Osteoarthritis  (ICD-715.90) 25)  Hypertension  (ICD-401.9) 26)  Coronary Artery Disease  (ICD-414.00)  Current Problems (verified): 1)  Acute Bronchitis  (ICD-466.0) 2)  Back Pain  (ICD-724.5) 3)  Fatigue  (ICD-780.79) 4)  Insomnia Unspecified  (ICD-780.52) 5)  Basal Cell Carcinoma, Nose  (ICD-173.3) 6)  Leg Cramps, Idiopathic  (ICD-729.82) 7)  Polyuria  (ICD-788.42) 8)  Obesity  (ICD-278.00) 9)  Edema  (ICD-782.3) 10)  Carotid Artery Disease  (ICD-433.10) 11)  Acute Maxillary Sinusitis  (ICD-461.0) 12)  Peritonsillar Abscess  (ICD-475) 13)  Acute Pharyngitis  (ICD-462) 14)  Cad, Native Vessel  (ICD-414.01) 15)  Hypertension, Heart Controlled w/o Assoc CHF  (ICD-402.10) 16)  G E R D  (ICD-530.81) 17)  Diabetes Mellitus, Type II  (ICD-250.00) 18)  Preventive Health Care  (ICD-V70.0) 19)  Morbid Obesity  (  ICD-278.01) 46)  Family History of Cad Male 1st Degree Relative <60  (ICD-V16.49) 21)  Hyperlipidemia  (ICD-272.4) 22)  COPD  (ICD-496) 23)  Family History of Cad Male 1st Degree Relative <50  (ICD-V17.3) 24)  Osteoarthritis  (ICD-715.90) 25)  Hypertension  (ICD-401.9) 26)  Coronary Artery Disease  (ICD-414.00)  Medications Prior to Update: 1)  Gabapentin 300 Mg  Caps (Gabapentin) .... Two Times A Day 2)  Bayer Low Strength 81 Mg  Tbec (Aspirin) .... Once Daily 3)  Diphenhydramine  Hcl 25 Mg  Caps (Diphenhydramine Hcl) .... As Needed 4)  Spironolactone 25 Mg  Tabs (Spironolactone) .... One By Mouth Daily 5)  Multivitamins   Caps (Multiple Vitamin) .Marland Kitchen.. 1 Once Daily 6)  Calcium 600/vitamin D 600-400 Mg-Unit  Tabs (Calcium Carbonate-Vitamin D) .Marland Kitchen.. 1 Once Daily 7)  Furosemide 40 Mg Tabs (Furosemide) .Marland Kitchen.. 1 By Mouth Daily As Needed 8)  Glimepiride 4 Mg Tabs (Glimepiride) .Marland Kitchen.. 1 Tab Once Daily 9)  Accu-Chek Aviva  Strp (Glucose Blood) .Marland Kitchen.. 1 Three Times A Day 10)  Lantus Solostar 100 Unit/ml Soln (Insulin Glargine) .... 30 U  in [place] 2 X Daily, May Increase By 2 Units Every 3 Days If Bs Remains Above 200 11)  Nitrostat 0.4 Mg Subl (Nitroglycerin) .Marland Kitchen.. 1 Tablet Under Tongue At Onset of Chest Pain; You May Repeat Every 5 Minutes For Up To 3 Doses. 12)  Accu-Chek Multiclix Lancets  Misc (Lancets) .Marland Kitchen.. 1 Once Daily 13)  Zolpidem Tartrate 5 Mg Tabs (Zolpidem Tartrate) .Marland Kitchen.. 1 Tab By Mouth At Bedtime As Needed Insomnia 14)  Mobic 7.5 Mg Tabs (Meloxicam) .Marland Kitchen.. 1 Tab By Mouth Once Daily As Needed Pain With Food 15)  Metoprolol Tartrate 50 Mg Tabs (Metoprolol Tartrate) .Marland Kitchen.. 1 Tab By Mouth Once Daily 16)  Prilosec 20 Mg Cpdr (Omeprazole) .Marland Kitchen.. 1 Tab By Mouth Once Daily 17)  Metformin Hcl 500 Mg Xr24h-Tab (Metformin Hcl) .Marland Kitchen.. 1 Tab By Mouth Qam and 2 Qpm 18)  Pravachol 20 Mg Tabs (Pravastatin Sodium) .Marland Kitchen.. 1 Tab By Mouth Qhs 19)  Lisinopril 5 Mg Tabs (Lisinopril) .Marland Kitchen.. 1 Tab By Mouth Daily 20)  Percocet 5-325 Mg Tabs (Oxycodone-Acetaminophen) .Marland Kitchen.. 1 Tab By Mouth Daily As Needed Pain 21)  Robitussin For Diabetics .... Q4 Hours 22)  Cipro 500 Mg Tabs (Ciprofloxacin Hcl) .Marland Kitchen.. 1 Tab By Mouth Two Times A Day X 10 Days 23)  Mucinex 600 Mg Xr12h-Tab (Guaifenesin) .Marland Kitchen.. 1 Tab By Mouth Two Times A Day X 10 Days 24)  Tessalon Perles 100 Mg Caps (Benzonatate) .Marland Kitchen.. 1 Cap By Mouth Three Times A Day As Needed Cough  Current Medications (verified): 1)  Gabapentin 300 Mg  Caps (Gabapentin) ....  Two Times A Day 2)  Bayer Low Strength 81 Mg  Tbec (Aspirin) .... Once Daily 3)  Diphenhydramine Hcl 25 Mg  Caps (Diphenhydramine Hcl) .... As Needed 4)  Spironolactone 25 Mg  Tabs (Spironolactone) .... One By Mouth Daily 5)  Multivitamins   Caps (Multiple Vitamin) .Marland Kitchen.. 1 Once Daily 6)  Calcium 600/vitamin D 600-400 Mg-Unit  Tabs (Calcium Carbonate-Vitamin D) .Marland Kitchen.. 1 Once Daily 7)  Furosemide 40 Mg Tabs (Furosemide) .Marland Kitchen.. 1 By Mouth Daily As Needed 8)  Glimepiride 4 Mg Tabs (Glimepiride) .Marland Kitchen.. 1 Tab Once Daily 9)  Accu-Chek Aviva  Strp (Glucose Blood) .Marland Kitchen.. 1 Three Times A Day 10)  Lantus Solostar 100 Unit/ml Soln (Insulin Glargine) .... 32 U  in [place] 2 X Daily, May Increase By 2 Units Every  3 Days If Bs Remains Above 200 11)  Nitrostat 0.4 Mg Subl (Nitroglycerin) .Marland Kitchen.. 1 Tablet Under Tongue At Onset of Chest Pain; You May Repeat Every 5 Minutes For Up To 3 Doses. 12)  Accu-Chek Multiclix Lancets  Misc (Lancets) .Marland Kitchen.. 1 Once Daily 13)  Zolpidem Tartrate 5 Mg Tabs (Zolpidem Tartrate) .Marland Kitchen.. 1 Tab By Mouth At Bedtime As Needed Insomnia 14)  Mobic 7.5 Mg Tabs (Meloxicam) .Marland Kitchen.. 1 Tab By Mouth Once Daily As Needed Pain With Food 15)  Metoprolol Tartrate 50 Mg Tabs (Metoprolol Tartrate) .Marland Kitchen.. 1 Tab By Mouth Once Daily 16)  Prilosec 20 Mg Cpdr (Omeprazole) .Marland Kitchen.. 1 Tab By Mouth Once Daily 17)  Kombiglyze Xr 2.05-998 Mg Xr24h-Tab (Saxagliptin-Metformin) .... One By Mouth Dailt 18)  Pravachol 20 Mg Tabs (Pravastatin Sodium) .Marland Kitchen.. 1 Tab By Mouth Qhs 19)  Mucinex 600 Mg Xr12h-Tab (Guaifenesin) .Marland Kitchen.. 1 Tab By Mouth Two Times A Day X 10 Days 20)  Clarithromycin 500 Mg Xr24h-Tab (Clarithromycin) .... One By Mouth Two Times A Day  For 10 Days 21)  Methylprednisolone 4 Mg Tabs (Methylprednisolone) .... Three For 3 Days, 2 For 3days The Then 1 For 3 Days Then Stop  Allergies (verified): 1)  ! Pcn 2)  ! Codeine 3)  Ace Inhibitors  Past History:  Family History: Last updated: 08/14/2009 Family History of CAD  Male 1st degree relative <50 Family History of CAD Male 1st degree relative <60 Father: deceased@94 , heart disease, migraines, PUD Mother: deceased@88 , Alzheimer's Dementia, heart disease Siblings: Sister: deceased@75 , heart disease, morbid obesity, arthritis Sister: 31,  arthritis, insomnia Brother: 27, smoker, arthritis, heart disease MGM: deceased in 11s, arthritis MGF: 77s, cerebral hemorrhage PGM: deceased young PGF: died in 77s, Alcohol abuse, migraines Children: Son: 3, sleep apnea, obese Daughter: 47, migraines, overweight Daughter: 69: thyroid disease,   Social History: Last updated: 08/14/2009 Retired Married Former Smoker,  quit 2 ppd 30 years ago, started after college No alcohol No illicit drugs  Risk Factors: Smoking Status: quit (11/16/2009)  Past medical, surgical, family and social histories (including risk factors) reviewed, and no changes noted (except as noted below).  Past Medical History: Reviewed history from 04/18/2009 and no changes required. Coronary artery disease, overlapping stents RCA. Last cath May 2008 with moderate disease LAD Hypertension Osteoarthritis-shoulder Restless leg Elevated lipids DM COPD Hyperlipidemia Nephrolithiasis SVT Dyspnea Diabetes mellitus, type II G E R D  Current Meds:  METFORMIN HCL 500 MG  TABS (METFORMIN HCL) two times a day PRAVACHOL 40 MG  TABS (PRAVASTATIN SODIUM) 1/2 tab by mouth once daily DUETACT 30-4 MG  TABS (PIOGLITAZONE HCL-GLIMEPIRIDE) once daily PROTONIX 40 MG  TBEC (PANTOPRAZOLE SODIUM) 1 once daily [BMN] LYRICA 75 MG  CAPS (PREGABALIN) two times a day BAYER LOW STRENGTH 81 MG  TBEC (ASPIRIN) once daily DIPHENHYDRAMINE HCL 25 MG  CAPS (DIPHENHYDRAMINE HCL) as needed SPIRONOLACTONE 25 MG  TABS (SPIRONOLACTONE) one by mouth daily Coreg 12.5mg  by mouth two times a day  ALLI 60 MG  CAPS (ORLISTAT) one before meals DARVOCET-N 100 100-650 MG  TABS (PROPOXYPHENE N-APAP) one by mouth q 6 hour  prn  Allergies: PCN, CODEINE.    Past Surgical History: Reviewed history from 07/01/2006 and no changes required. Cholecystectomy  Family History: Reviewed history from 08/14/2009 and no changes required. Family History of CAD Male 1st degree relative <50 Family History of CAD Male 1st degree relative <60 Father: deceased@94 , heart disease, migraines, PUD Mother: deceased@88 , Alzheimer's Dementia, heart disease Siblings: Sister: deceased@75 , heart disease, morbid obesity, arthritis  Sister: 36,  arthritis, insomnia Brother: 72, smoker, arthritis, heart disease MGM: deceased in 8s, arthritis MGF: 52s, cerebral hemorrhage PGM: deceased young PGF: died in 35s, Alcohol abuse, migraines Children: Son: 61, sleep apnea, obese Daughter: 45, migraines, overweight Daughter: 74: thyroid disease,   Social History: Reviewed history from 08/14/2009 and no changes required. Retired Married Former Smoker,  quit 2 ppd 30 years ago, started after college No alcohol No illicit drugs  Review of Systems  The patient denies anorexia, fever, weight loss, weight gain, vision loss, decreased hearing, hoarseness, chest pain, syncope, dyspnea on exertion, peripheral edema, prolonged cough, headaches, hemoptysis, abdominal pain, melena, hematochezia, severe indigestion/heartburn, hematuria, incontinence, genital sores, muscle weakness, suspicious skin lesions, transient blindness, difficulty walking, depression, unusual weight change, abnormal bleeding, enlarged lymph nodes, angioedema, and breast masses.    Physical Exam  General:  well-nourished and overweight-appearing.   Head:  normocephalic and no abnormalities palpated.   Eyes:  pupils equal and pupils round.   Ears:  R ear normal and L ear normal.   Nose:  mucosal erythema and mucosal edema.   Mouth:  posterior lymphoid hypertrophy and postnasal drip.   Lungs:  Normal respiratory effort, chest expands symmetrically. Lungs are clear to  auscultation, no crackles or wheezes. Heart:  Normal rate and regular rhythm. S1 and S2 normal without gallop, murmur, click, rub or other extra sounds. Abdomen:  Bowel sounds positive,abdomen soft and non-tender without masses, organomegaly or hernias noted. Obese   Impression & Recommendations:  Problem # 1:  DIABETES MELLITUS, TYPE II (ICD-250.00) prior reaction to lisenpril+cough The following medications were removed from the medication list:    Lisinopril 5 Mg Tabs (Lisinopril) .Marland Kitchen... 1 tab by mouth daily His updated medication list for this problem includes:    Bayer Low Strength 81 Mg Tbec (Aspirin) ..... Once daily    Glimepiride 4 Mg Tabs (Glimepiride) .Marland Kitchen... 1 tab once daily    Lantus Solostar 100 Unit/ml Soln (Insulin glargine) .Marland Kitchen... 32 u  in abdomal  area 2 x daily, may increase by 2 units every 3 days if bs remains above 200    Kombiglyze Xr 2.05-998 Mg Xr24h-tab (Saxagliptin-metformin) ..... One by mouth dailt  Labs Reviewed: Creat: 1.3 (08/14/2009)    Reviewed HgBA1c results: 9.8 (08/07/2009)  8.6 (05/08/2009)  Problem # 2:  SINUSITIS, ACUTE (ICD-461.9)  keep on the robitussin add antibiotics and medrol slght wheezing noted and allergic component The following medications were removed from the medication list:    Cipro 500 Mg Tabs (Ciprofloxacin hcl) .Marland Kitchen... 1 tab by mouth two times a day x 10 days    Tessalon Perles 100 Mg Caps (Benzonatate) .Marland Kitchen... 1 cap by mouth three times a day as needed cough His updated medication list for this problem includes:    Mucinex 600 Mg Xr12h-tab (Guaifenesin) .Marland Kitchen... 1 tab by mouth two times a day x 10 days    Clarithromycin 500 Mg Xr24h-tab (Clarithromycin) ..... One by mouth two times a day  for 10 days  Instructed on treatment. Call if symptoms persist or worsen.   Complete Medication List: 1)  Gabapentin 300 Mg Caps (Gabapentin) .... Two times a day 2)  Bayer Low Strength 81 Mg Tbec (Aspirin) .... Once daily 3)  Diphenhydramine Hcl  25 Mg Caps (Diphenhydramine hcl) .... As needed 4)  Spironolactone 25 Mg Tabs (Spironolactone) .... One by mouth daily 5)  Multivitamins Caps (Multiple vitamin) .Marland Kitchen.. 1 once daily 6)  Calcium 600/vitamin D 600-400 Mg-unit Tabs (Calcium carbonate-vitamin d) .Marland KitchenMarland KitchenMarland Kitchen  1 once daily 7)  Furosemide 40 Mg Tabs (Furosemide) .Marland Kitchen.. 1 by mouth daily as needed 8)  Glimepiride 4 Mg Tabs (Glimepiride) .Marland Kitchen.. 1 tab once daily 9)  Accu-chek Aviva Strp (Glucose blood) .Marland Kitchen.. 1 three times a day 10)  Lantus Solostar 100 Unit/ml Soln (Insulin glargine) .... 32 u  in abdomal  area 2 x daily, may increase by 2 units every 3 days if bs remains above 200 11)  Nitrostat 0.4 Mg Subl (Nitroglycerin) .Marland Kitchen.. 1 tablet under tongue at onset of chest pain; you may repeat every 5 minutes for up to 3 doses. 12)  Accu-chek Multiclix Lancets Misc (Lancets) .Marland Kitchen.. 1 once daily 13)  Zolpidem Tartrate 5 Mg Tabs (Zolpidem tartrate) .Marland Kitchen.. 1 tab by mouth at bedtime as needed insomnia 14)  Mobic 7.5 Mg Tabs (Meloxicam) .Marland Kitchen.. 1 tab by mouth once daily as needed pain with food 15)  Metoprolol Tartrate 50 Mg Tabs (Metoprolol tartrate) .Marland Kitchen.. 1 tab by mouth once daily 16)  Prilosec 20 Mg Cpdr (Omeprazole) .Marland Kitchen.. 1 tab by mouth once daily 17)  Kombiglyze Xr 2.05-998 Mg Xr24h-tab (Saxagliptin-metformin) .... One by mouth dailt 18)  Pravachol 20 Mg Tabs (Pravastatin sodium) .Marland Kitchen.. 1 tab by mouth qhs 19)  Mucinex 600 Mg Xr12h-tab (Guaifenesin) .Marland Kitchen.. 1 tab by mouth two times a day x 10 days 20)  Clarithromycin 500 Mg Xr24h-tab (Clarithromycin) .... One by mouth two times a day  for 10 days 21)  Methylprednisolone 4 Mg Tabs (Methylprednisolone) .... Three for 3 days, 2 for 3days the then 1 for 3 days then stop  Hypertension Assessment/Plan:      The patient's hypertensive risk group is category C: Target organ damage and/or diabetes.  Today's blood pressure is 140/76.  His blood pressure goal is < 130/80.   Patient Instructions: 1)  Please schedule a follow-up  appointment in 2 months. Prescriptions: KOMBIGLYZE XR 2.05-998 MG XR24H-TAB (SAXAGLIPTIN-METFORMIN) one by mouth dailt  #30 x 11   Entered and Authorized by:   Ricard Dillon MD   Signed by:   Ricard Dillon MD on 11/16/2009   Method used:   Electronically to        [facility] [phone]* (retail)       [address]       [place], [place]  [address]       Ph: [phone]       Fax: [phone]   RxID:   [id number] METHYLPREDNISOLONE 4 MG TABS (METHYLPREDNISOLONE) three for 3 days, 2 for 3days the then 1 for 3 days then stop  #18 x 0   Entered and Authorized by:   Ricard Dillon MD   Signed by:   Ricard Dillon MD on 11/16/2009   Method used:   Electronically to        [facility] [phone]* (retail)       [address]       [place], [place]  [address]       Ph: [phone]       Fax: [phone]   RxID:   [id number] CLARITHROMYCIN 500 MG XR24H-TAB (CLARITHROMYCIN) one by mouth two times a day  for 10 days  #20 x 0   Entered and Authorized by:   Ricard Dillon MD   Signed by:   Ricard Dillon MD on 11/16/2009   Method used:  Electronically to        [facility] [id number]* (retail)       [address]       [place],   [address]       Ph: [phone]       Fax: [phone]   RxID:   [id number]    Orders Added: 1)  Est. Patient Level IV [id number]

## 2010-02-06 NOTE — Progress Notes (Signed)
Summary: very sick  Phone Note Call from Patient Call back at Home Phone [phone] Call back at [phone]   Reason for Call: Talk to Nurse Summary of Call: Sick since Wed vomiting constantly & diarrhea constantly.   Weak.  T99.9.  No congestion or pain.  Unable to keep down any meds or has not taken since Thurs.  Tues BS after breakfast 225. BS now 246 fasting.  Very little clear liquids taken or held down.  Not sure what it is, but he's very sick.  [facility].  Allergic to pcn & codeine.   Initial call taken by: Shelbie Hutching, RN,  March 24, 2009 8:25 AM  Follow-up for Phone Call        lomotil one by mouth post each loose stool ginger ale may also call in zofran 4 mg number 5 one by mouth q 6 Follow-up by: Ricard Dillon MD,  March 24, 2009 8:37 AM  Additional Follow-up for Phone Call Additional follow up Details #1::        Phone Call Completed Additional Follow-up by: Shelbie Hutching, RN,  March 24, 2009 8:57 AM    New/Updated Medications: LOMOTIL 2.5-0.025 MG TABS (DIPHENOXYLATE-ATROPINE) One post each diarrhea stool ZOFRAN 4 MG TABS (ONDANSETRON HCL) One every 6 hours Prescriptions: ZOFRAN 4 MG TABS (ONDANSETRON HCL) One every 6 hours  #5 x 0   Entered by:   Shelbie Hutching, RN   Authorized by:   Ricard Dillon MD   Signed by:   Shelbie Hutching, RN on 03/24/2009   Method used:   Telephoned to ...       [id number] ([facility]ail)       [address]       [place], [place]  [address]       Ph: [phone]       Fax: [phone]   RxID:   [id number] [place] 2.5-0.025 MG TABS (DIPHENOXYLATE-ATROPINE) One post each diarrhea stool  #10 x 0   Entered by:   Shelbie Hutching, RN   Authorized by:   Ricard Dillon MD   Signed by:   Shelbie Hutching, RN on 03/24/2009   Method used:   Telephoned to ...       [id number] (retail)       [address]       [place], [place]  [address]       Ph: [phone]       Fax: [phone]   RxID:   [id number]

## 2010-02-06 NOTE — Assessment & Plan Note (Signed)
Summary: 1 mnth rov//slm   Vital Signs:  Patient profile:   77 year old male Height:      70 inches (177.80 cm) Weight:      264 pounds (120.00 kg) O2 Sat:      97 % on Room air Temp:     98.8 degrees F (37.11 degrees C) oral Pulse rate:   95 / minute BP sitting:   128 / 78  (left arm) Cuff size:   large  Vitals Entered By: Gardenia Phlegm RMA (September 19, 2009 8:05 AM)  O2 Flow:  Room air CC: 1 month follow up/ pt states he quit taking his Crestor/ CF Is Patient Diabetic? Yes   History of Present Illness: Patient in today for followup on multiple medical problems. His wife underwent cervical surgery in mid August. As a result his neighbors have been kind enough to bring food and he has been eating very badly. Previously he was taking Lantus 25 units b.i.d. and noting blood sugars in the 150-200 range. More recently he has only been using Lantus once daily at 27 and he is noting blood sugars up in the 300 range again. Persistent fatigue and intermittent lightheadedness are noted as a result. He is complaining of persistent polyuria worse at night denies dysuria.he is presently not walking as he previously did due to his wife's surgery on his back pain. A week ago he had his third epidural injection in his neck that was inadequate response. His previous 2 shots worked quickly to alleviate his pain. This time he is mid back pain which radiates slightly to the right is persistent. Last Meloxicam 7.5 mg and Tylenol have provided only mild and temporary leave.he describes the pain as dull and. No radicular symptoms. No chest pain, palpitations, shortness of breath, fevers, chills, GI complaints are noted.  He has not been taking medications as prescribed. His wife is with him today and the report confusion over the medications.HEENT stopped Crestor when he ran out several months ago. Review of his chart reveals he is taking Pravachol and the past and does not recall having difficulty with that.  She has had samples of desonide in the past for reflux which have been helpful but he's run out of this as well and cannot afford the co-pay. He is taking Prilosec in the past with good response. We did prescribe Bactrim last month as an alternative but he never picked it up as a result he is having reflux on a regular basis.  Current Medications (verified): 1)  Gabapentin 300 Mg  Caps (Gabapentin) .... Two Times A Day 2)  Bayer Low Strength 81 Mg  Tbec (Aspirin) .... Once Daily 3)  Diphenhydramine Hcl 25 Mg  Caps (Diphenhydramine Hcl) .... As Needed 4)  Spironolactone 25 Mg  Tabs (Spironolactone) .... One By Mouth Daily 5)  Multivitamins   Caps (Multiple Vitamin) .Marland Kitchen.. 1 Once Daily 6)  Calcium 600/vitamin D 600-400 Mg-Unit  Tabs (Calcium Carbonate-Vitamin D) .Marland Kitchen.. 1 Once Daily 7)  Furosemide 40 Mg Tabs (Furosemide) .Marland Kitchen.. 1 By Mouth Daily As Needed 8)  Glimepiride 4 Mg Tabs (Glimepiride) .Marland Kitchen.. 1 Tab Once Daily 9)  Crestor 20 Mg Tabs (Rosuvastatin Calcium) .... One Every Monday and Friday 10)  Accu-Chek Aviva  Strp (Glucose Blood) .Marland Kitchen.. 1 Three Times A Day 11)  Lantus Solostar 100 Unit/ml Soln (Insulin Glargine) .... 25 U  in [place] Daily, May Increase By 2 Units Every 3 Days If Bs Remains Above 200 12)  Nitrostat 0.4 Mg Subl (Nitroglycerin) .Marland Kitchen.. 1 Tablet Under Tongue At Onset of Chest Pain; You May Repeat Every 5 Minutes For Up To 3 Doses. 13)  Accu-Chek Multiclix Lancets  Misc (Lancets) .Marland Kitchen.. 1 Once Daily 14)  Zolpidem Tartrate 5 Mg Tabs (Zolpidem Tartrate) .Marland Kitchen.. 1 Tab By Mouth At Bedtime As Needed Insomnia 15)  Mobic 7.5 Mg Tabs (Meloxicam) .Marland Kitchen.. 1 Tab By Mouth Once Daily As Needed Pain With Food 16)  Metoprolol Tartrate 50 Mg Tabs (Metoprolol Tartrate) .Marland Kitchen.. 1 Tab By Mouth Once Daily 17)  Kombiglyze Xr 05-998 Mg Xr24h-Tab (Saxagliptin-Metformin) .Marland Kitchen.. 1 Tab By Mouth Once Daily 18)  Prilosec 20 Mg Cpdr (Omeprazole) .Marland Kitchen.. 1 Tab By Mouth Once Daily  Allergies (verified): 1)  ! Pcn 2)  !  Codeine  Past History:  Past medical history reviewed for relevance to current acute and chronic problems. Social history (including risk factors) reviewed for relevance to current acute and chronic problems.  Past Medical History: Reviewed history from 04/18/2009 and no changes required. Coronary artery disease, overlapping stents RCA. Last cath May 2008 with moderate disease LAD Hypertension Osteoarthritis-shoulder Restless leg Elevated lipids DM COPD Hyperlipidemia Nephrolithiasis SVT Dyspnea Diabetes mellitus, type II G E R D  Current Meds:  METFORMIN HCL 500 MG  TABS (METFORMIN HCL) two times a day PRAVACHOL 40 MG  TABS (PRAVASTATIN SODIUM) 1/2 tab by mouth once daily DUETACT 30-4 MG  TABS (PIOGLITAZONE HCL-GLIMEPIRIDE) once daily PROTONIX 40 MG  TBEC (PANTOPRAZOLE SODIUM) 1 once daily [BMN] LYRICA 75 MG  CAPS (PREGABALIN) two times a day BAYER LOW STRENGTH 81 MG  TBEC (ASPIRIN) once daily DIPHENHYDRAMINE HCL 25 MG  CAPS (DIPHENHYDRAMINE HCL) as needed SPIRONOLACTONE 25 MG  TABS (SPIRONOLACTONE) one by mouth daily Coreg 12.5mg  by mouth two times a day  ALLI 60 MG  CAPS (ORLISTAT) one before meals DARVOCET-N 100 100-650 MG  TABS (PROPOXYPHENE N-APAP) one by mouth q 6 hour prn  Allergies: PCN, CODEINE.    Social History: Reviewed history from 08/14/2009 and no changes required. Retired Married Former Smoker,  quit 2 ppd 30 years ago, started after college No alcohol No illicit drugs  Review of Systems      See HPI       Flu Vaccine Consent Questions     Do you have a history of severe allergic reactions to this vaccine? no    Any prior history of allergic reactions to egg and/or gelatin? no    Do you have a sensitivity to the preservative Thimersol? no    Do you have a past history of Guillan-Barre Syndrome? no    Do you currently have an acute febrile illness? no    Have you ever had a severe reaction to latex? no    Vaccine information given and explained  to patient? yes    Are you currently pregnant? no    Lot Number:[id number]   Exp Date:07/07/2010   Site Given  Left Deltoid IM Gardenia Phlegm RMA  September 19, 2009 8:16 AM   Physical Exam  General:  Well-developed,well-nourished,in no acute distress; alert,appropriate and cooperative throughout examination Head:  Normocephalic and atraumatic without obvious abnormalities. No apparent alopecia or balding. Mouth:  Oral mucosa and oropharynx without lesions or exudates.  Teeth in good repair. Neck:  No deformities, masses, or tenderness noted. Lungs:  Normal respiratory effort, chest expands symmetrically. Lungs are clear to auscultation, no crackles or wheezes. Heart:  Normal rate and regular rhythm. S1 and S2 normal without gallop, click, rub  or other extra sounds.grade 1 /6 systolic murmur.   Abdomen:  Bowel sounds positive,abdomen soft and non-tender without masses, organomegaly or hernias noted. Extremities:  No clubbing, cyanosis, edema, or deformity noted .   Cervical Nodes:  No lymphadenopathy noted Psych:  Cognition and judgment appear intact. Alert and cooperative with normal attention span and concentration. No apparent delusions, illusions, hallucinations   Impression & Recommendations:  Problem # 1:  HYPERTENSION, HEART CONTROLLED W/O ASSOC CHF (ICD-402.10)  His updated medication list for this problem includes:    Spironolactone 25 Mg Tabs (Spironolactone) ..... One by mouth daily    Furosemide 40 Mg Tabs (Furosemide) .Marland Kitchen... 1 by mouth daily as needed    Metoprolol Tartrate 50 Mg Tabs (Metoprolol tartrate) .Marland Kitchen... 1 tab by mouth once daily    Lisinopril 5 Mg Tabs (Lisinopril) .Marland Kitchen... 1 tab by mouth daily Well controlled but was without ACE inhibition, restarted Lisinopril  Problem # 2:  DIABETES MELLITUS, TYPE II (ICD-250.00)  The following medications were removed from the medication list:    Kombiglyze Xr 05-998 Mg Xr24h-tab (Saxagliptin-metformin) .Marland Kitchen... 1 tab by mouth  once daily His updated medication list for this problem includes:    Bayer Low Strength 81 Mg Tbec (Aspirin) ..... Once daily    Glimepiride 4 Mg Tabs (Glimepiride) .Marland Kitchen... 1 tab once daily    Lantus Solostar 100 Unit/ml Soln (Insulin glargine) .Marland Kitchen... 25 u  in abdomal  area 2 x daily, may increase by 2 units every 3 days if bs remains above 200    Metformin Hcl 500 Mg Xr24h-tab (Metformin hcl) .Marland Kitchen... 2 tabs by mouth qhs    Lisinopril 5 Mg Tabs (Lisinopril) .Marland Kitchen... 1 tab by mouth daily Patient will check sugars at least 2 x daily and up to 4 x daily as indicated and call with any concerns, will try to simplify regimen today   Problem # 3:  POLYURIA (ICD-788.42) Worse at bedtime, likely related to BPH but withnormal PSA, due to multiple medication changes today will defer to next visit for possible initiation of new medication, restrict fluid intake in evening after dinner in meantime.  Problem # 4:  G E R D (ICD-530.81)  His updated medication list for this problem includes:    Prilosec 20 Mg Cpdr (Omeprazole) .Marland Kitchen... 1 tab by mouth once daily Patient never picked up the Prilosec after last visit, rx is resent to pharmacy and patient will go initiate therapy.  Problem # 5:  HYPERLIPIDEMIA (B2193296.4)  The following medications were removed from the medication list:    Crestor 20 Mg Tabs (Rosuvastatin calcium) ..... One every monday and friday His updated medication list for this problem includes:    Pravachol 20 Mg Tabs (Pravastatin sodium) .Marland Kitchen... 1 tab by mouth qhs Patient has not been taking Crestor since he ran out of samples several months ago. Has taken Pravachol in past and did not have any notable trouble so will restart this and check LFT in 1 month  Problem # 6:  INSOMNIA UNSPECIFIED (ICD-780.52)  His updated medication list for this problem includes:    Zolpidem Tartrate 5 Mg Tabs (Zolpidem tartrate) .Marland Kitchen... 1 tab by mouth at bedtime as needed insomnia %mg of Zolpidem ineffective at  keeping him asleep, may try taking 2 at bedtime, notify us if any SE or ineffective and willconsider change in medication  Problem # 7:  BACK PAIN (ICD-724.5)  His updated medication list for this problem includes:    Bayer Low Strength 81 Mg Tbec (  Aspirin) ..... Once daily    Mobic 7.5 Mg Tabs (Meloxicam) .Marland Kitchen... 1 tab by mouth once daily as needed pain with food    Percocet 5-325 Mg Tabs (Oxycodone-acetaminophen) .Marland Kitchen... 1 tab by mouth daily as needed pain Just had his 3rd epidural injection in his back last week and is frustrated that he has not gotten as much relief as he did with previous shots. May try 2 Meloxicams once daily and 2 Tylenol two times a day if inadequate pain relief, given just a few Percocet to use as needed for severe pain and instructed not to drive on this medication. He expresses understanding  Orders: Prescription Created Electronically [phone])  Complete Medication List: 1)  Gabapentin 300 Mg Caps (Gabapentin) .... Two times a day 2)  Bayer Low Strength 81 Mg Tbec (Aspirin) .... Once daily 3)  Diphenhydramine Hcl 25 Mg Caps (Diphenhydramine hcl) .... As needed 4)  Spironolactone 25 Mg Tabs (Spironolactone) .... One by mouth daily 5)  Multivitamins Caps (Multiple vitamin) .Marland Kitchen.. 1 once daily 6)  Calcium 600/vitamin D 600-400 Mg-unit Tabs (Calcium carbonate-vitamin d) .Marland Kitchen.. 1 once daily 7)  Furosemide 40 Mg Tabs (Furosemide) .Marland Kitchen.. 1 by mouth daily as needed 8)  Glimepiride 4 Mg Tabs (Glimepiride) .Marland Kitchen.. 1 tab once daily 9)  Accu-chek Aviva Strp (Glucose blood) .Marland Kitchen.. 1 three times a day 10)  Lantus Solostar 100 Unit/ml Soln (Insulin glargine) .... 25 u  in abdomal  area 2 x daily, may increase by 2 units every 3 days if bs remains above 200 11)  Nitrostat 0.4 Mg Subl (Nitroglycerin) .Marland Kitchen.. 1 tablet under tongue at onset of chest pain; you may repeat every 5 minutes for up to 3 doses. 12)  Accu-chek Multiclix Lancets Misc (Lancets) .Marland Kitchen.. 1 once daily 13)  Zolpidem Tartrate 5 Mg  Tabs (Zolpidem tartrate) .Marland Kitchen.. 1 tab by mouth at bedtime as needed insomnia 14)  Mobic 7.5 Mg Tabs (Meloxicam) .Marland Kitchen.. 1 tab by mouth once daily as needed pain with food 15)  Metoprolol Tartrate 50 Mg Tabs (Metoprolol tartrate) .Marland Kitchen.. 1 tab by mouth once daily 16)  Prilosec 20 Mg Cpdr (Omeprazole) .Marland Kitchen.. 1 tab by mouth once daily 17)  Metformin Hcl 500 Mg Xr24h-tab (Metformin hcl) .... 2 tabs by mouth qhs 18)  Pravachol 20 Mg Tabs (Pravastatin sodium) .Marland Kitchen.. 1 tab by mouth qhs 19)  Lisinopril 5 Mg Tabs (Lisinopril) .Marland Kitchen.. 1 tab by mouth daily 20)  Percocet 5-325 Mg Tabs (Oxycodone-acetaminophen) .Marland Kitchen.. 1 tab by mouth daily as needed pain  Other Orders: Flu Vaccine 36yrs + MEDICARE PATIENTS [id number]) Administration Flu vaccine - MCR [id number])  Patient Instructions: 1)  Please schedule a follow-up appointment in 1 month.  2)  Limit your Sodium(salt) .  3)  Check your blood sugars regularly. If your readings are usually above:  or below 70 you should contact our office. Call if concerns  regarding hi or low blood sugars occur. 4)  Try taking Meloxicam 7.5mg  2 tabs by mouth daily as needed pain with food. 5)  May try to increase Zolpidem 5 mg tab to 2 tabs by mouth at bedtime as needed for insomnia 6)  Hepatic Panel prior to visit ICD-9: 401.1 Prescriptions: PERCOCET 5-325 MG TABS (OXYCODONE-ACETAMINOPHEN) 1 tab by mouth daily as needed pain  #20 x 0   Entered and Authorized by:   Penni Homans MD   Signed by:   Penni Homans MD on 09/19/2009   Method used:   Print then Give to Patient   RxID:  [id number] LISINOPRIL 5 MG TABS (LISINOPRIL) 1 tab by mouth daily  #30 x 5   Entered and Authorized by:   Penni Homans MD   Signed by:   Penni Homans MD on 09/19/2009   Method used:   Electronically to        [facility] [phone]* (retail)       [address]       [place], [place]  [address]       Ph: [phone]       Fax: [phone]   RxID:   [id number] PRAVACHOL 20 MG  TABS (PRAVASTATIN SODIUM) 1 tab by mouth qhs  #30 x 3   Entered and Authorized by:   Penni Homans MD   Signed by:   Penni Homans MD on 09/19/2009   Method used:   Electronically to        [facility] [phone]* (retail)       [address]       [place], [place]  [address]       Ph: [phone]       Fax: [phone]   RxID:   [id number] METFORMIN HCL 500 MG XR24H-TAB (METFORMIN HCL) 2 tabs by mouth qhs  #60 x 3   Entered and Authorized by:   Penni Homans MD   Signed by:   Penni Homans MD on 09/19/2009   Method used:   Electronically to        [facility] [phone]* (retail)       [address]       [place], [place]  [address]       Ph: [phone]       Fax: [phone]   RxID:   [id number] LANTUS SOLOSTAR 100 UNIT/ML SOLN (INSULIN GLARGINE) 25 u  in abdomal  area 2 x daily, may increase by 2 units every 3 days if BS remains above 200  #5 pens x 5   Entered and Authorized by:   Penni Homans MD   Signed by:   Penni Homans MD on 09/19/2009   Method used:   Electronically to        [facility] [phone]* (retail)       [address]       [place], [place]  [address]       Ph: [phone]       Fax: [phone]   RxID:   [id number] [place] 20 MG CPDR (OMEPRAZOLE) 1 tab by mouth once daily  #30 x 5   Entered and Authorized by:   Penni Homans MD   Signed by:   Penni Homans MD on 09/19/2009   Method used:   Electronically to        [facility] [phone]* (retail)       [address]       [place], [place]  [address]       Ph: [phone]       Fax: [phone]   [place]:   [phone]

## 2010-02-06 NOTE — Progress Notes (Signed)
Summary: [place] re: Status of Diabetic Testing Supplies  Phone Note From Other Clinic Call back at [phone] ext [place]: [place] Summary of Call: [facility] called re: a fax that they sent for pts Diabetic Testing Supplies. Would like to know status of request.  Initial call taken by: Braulio Bosch,  January 26, 2009 10:23 AM  Follow-up for Phone Call        talked with pt and they dod not want to deal with them-will stay with local company.they were informed Follow-up by: Allyne Gee, LPN,  January 20, 624THL 10:27 AM     Appended Document: Orders Update     Clinical Lists Changes        Diabetes Management Exam:    Eye Exam:       Eye Exam done elsewhere          Date: 02/13/2009          Results: normal-cataracts-ou          Done by: dr Gershon Crane  Diabetes Management Assessment/Plan:      The following lipid goals have been established for the patient: Total cholesterol goal of 200; LDL cholesterol goal of 100; HDL cholesterol goal of 40; Triglyceride goal of 150.  His blood pressure goal is < 130/80.

## 2010-02-06 NOTE — Assessment & Plan Note (Signed)
Summary: 1 MONTH ROV/NJR   Vital Signs:  Patient profile:   77 year old male Height:      70 inches Weight:      261 pounds BMI:     37.58 Temp:     98.3 degrees F oral Pulse rate:   76 / minute Resp:     14 per minute BP sitting:   130 / 66  (left arm) Cuff size:   large  Vitals Entered By: Allyne Gee, LPN (March 24, 624THL 624THL AM)  Nutrition Counseling: Patient's BMI is greater than 25 and therefore counseled on weight management options. CC: roa-changed ti dexilant and kombiglyce 05-998--blood sugars are ranging from 188 to200-fasting this am was 168, Type 2 diabetes mellitus follow-up   Primary Care Provider:  Ricard Dillon MD  CC:  roa-changed ti dexilant and kombiglyce 05-998--blood sugars are ranging from 188 to200-fasting this am was 168 and Type 2 diabetes mellitus follow-up.  History of Present Illness:  Type 2 Diabetes Mellitus Follow-Up      This is a 77 year old man who presents for Type 2 diabetes mellitus follow-up.  The patient reports weight gain, but denies polyuria, polydipsia, blurred vision, self managed hypoglycemia, hypoglycemia requiring help, weight loss, and numbness of extremities.  The patient denies the following symptoms: neuropathic pain, chest pain, vomiting, orthostatic symptoms, poor wound healing, intermittent claudication, vision loss, and foot ulcer.  Since the last visit the patient reports poor dietary compliance.  The patient has been measuring capillary blood glucose before breakfast.  Since the last visit, the patient reports having had eye care by an ophthalmologist.    Preventive Screening-Counseling & Management  Alcohol-Tobacco     Smoking Status: quit  Current Problems (verified): 1)  Carotid Artery Disease  (ICD-433.10) 2)  Acute Maxillary Sinusitis  (ICD-461.0) 3)  Peritonsillar Abscess  (ICD-475) 4)  Acute Pharyngitis  (ICD-462) 5)  Cad, Native Vessel  (ICD-414.01) 6)  Hypertension, Heart Controlled w/o Assoc CHF   (ICD-402.10) 7)  G E R D  (ICD-530.81) 8)  Diabetes Mellitus, Type II  (ICD-250.00) 9)  Preventive Health Care  (ICD-V70.0) 10)  Morbid Obesity  (ICD-278.01) 72)  Family History of Cad Male 1st Degree Relative <60  (ICD-V16.49) 12)  Hyperlipidemia  (ICD-272.4) 13)  COPD  (ICD-496) 14)  Family History of Cad Male 1st Degree Relative <50  (ICD-V17.3) 15)  Osteoarthritis  (ICD-715.90) 16)  Hypertension  (ICD-401.9) 17)  Coronary Artery Disease  (ICD-414.00)  Current Medications (verified): 1)  Dexilant 30 Mg Cpdr (Dexlansoprazole) .Marland Kitchen.. 1 Two Times A Day 2)  Gabapentin 300 Mg  Caps (Gabapentin) .... Two Times A Day 3)  Bayer Low Strength 81 Mg  Tbec (Aspirin) .... Once Daily 4)  Diphenhydramine Hcl 25 Mg  Caps (Diphenhydramine Hcl) .... As Needed 5)  Spironolactone 25 Mg  Tabs (Spironolactone) .... One By Mouth Daily 6)  Tramadol Hcl 50 Mg Tabs (Tramadol Hcl) .Marland Kitchen.. 1 With Tylenol 325 Every 6-8 Hours As Needed Pain 7)  Multivitamins   Caps (Multiple Vitamin) .Marland Kitchen.. 1 Once Daily 8)  Calcium 600/vitamin D 600-400 Mg-Unit  Tabs (Calcium Carbonate-Vitamin D) .Marland Kitchen.. 1 Once Daily 9)  Furosemide 40 Mg Tabs (Furosemide) .Marland Kitchen.. 1 By Mouth Daily As Needed 10)  Glimepiride 4 Mg Tabs (Glimepiride) .Marland Kitchen.. 1 Tab Once Daily 11)  Bystolic 5 Mg Tabs (Nebivolol Hcl) .Marland Kitchen.. 1 Once Daily 12)  Crestor 20 Mg Tabs (Rosuvastatin Calcium) .... One Every Monday and Friday 13)  Kombiglyze Xr 05-998  Mg Xr24h-Tab (Saxagliptin-Metformin) .... One By Mouth Daily 14)  Accu-Chek Aviva  Strp (Glucose Blood) .Marland Kitchen.. 1 Three Times A Day  Allergies (verified): 1)  ! Pcn 2)  ! Codeine  Past History:  Family History: Last updated: 09/15/2006 Family History of CAD Male 1st degree relative <50 Family History of CAD Male 1st degree relative <60  Social History: Last updated: 09/15/2006 Retired Married Former Smoker  Risk Factors: Smoking Status: quit (03/30/2009)  Past medical, surgical, family and social histories  (including risk factors) reviewed, and no changes noted (except as noted below).  Past Medical History: Reviewed history from 09/02/2007 and no changes required. Coronary artery disease Hypertension Osteoarthritis-shoulder Restless leg Elevated lipids DM COPD Hyperlipidemia Nephrolithiasis SVT Dyspnea Diabetes mellitus, type II G E R D  Current Meds:  METFORMIN HCL 500 MG  TABS (METFORMIN HCL) two times a day PRAVACHOL 40 MG  TABS (PRAVASTATIN SODIUM) 1/2 tab by mouth once daily DUETACT 30-4 MG  TABS (PIOGLITAZONE HCL-GLIMEPIRIDE) once daily PROTONIX 40 MG  TBEC (PANTOPRAZOLE SODIUM) 1 once daily [BMN] LYRICA 75 MG  CAPS (PREGABALIN) two times a day BAYER LOW STRENGTH 81 MG  TBEC (ASPIRIN) once daily DIPHENHYDRAMINE HCL 25 MG  CAPS (DIPHENHYDRAMINE HCL) as needed SPIRONOLACTONE 25 MG  TABS (SPIRONOLACTONE) one by mouth daily Coreg 12.5mg  by mouth two times a day  ALLI 60 MG  CAPS (ORLISTAT) one before meals DARVOCET-N 100 100-650 MG  TABS (PROPOXYPHENE N-APAP) one by mouth q 6 hour prn  Allergies: PCN, CODEINE.    Past Surgical History: Reviewed history from 07/01/2006 and no changes required. Cholecystectomy  Family History: Reviewed history from 09/15/2006 and no changes required. Family History of CAD Male 1st degree relative <50 Family History of CAD Male 1st degree relative <60  Social History: Reviewed history from 09/15/2006 and no changes required. Retired Married Former Smoker  Physical Exam  General:  patient is alert nontoxic in appearance Head:  normocephalic.   Eyes:  pupils equal and pupils round.   Ears:  R ear normal and L ear normal.   Nose:  no external deformity and no nasal discharge.   Mouth:  pharynx pink and moist.   Neck:  supple with tender anterior cervical adenopathy right greater than left. Lungs:  normal respiratory effort and no wheezes.   Heart:  normal rate and regular rhythm.   Abdomen:  soft and non-tender.      Impression & Recommendations:  Problem # 1:  MORBID OBESITY (ICD-278.01)  Ht: 70 (03/30/2009)   Wt: 261 (03/30/2009)   BMI: 37.58 (03/30/2009)  Problem # 2:  DIABETES MELLITUS, TYPE II (ICD-250.00) taking the kombiglyze twice a day adjust meds teach lantius inkjections I have spent greater that 45 min face to face evaluating this patient and over 1/2 of this time was in councilling  His updated medication list for this problem includes:    Bayer Low Strength 81 Mg Tbec (Aspirin) ..... Once daily    Glimepiride 4 Mg Tabs (Glimepiride) .Marland Kitchen... 1 tab once daily    Kombiglyze Xr 2.05-998 Mg Xr24h-tab (Saxagliptin-metformin) ..... One by mouth daily ( replaced the 1000/5)    Lantus Solostar 100 Unit/ml Soln (Insulin glargine) .Marland Kitchen... 20 u  in abdomal  area daily  Labs Reviewed: Creat: 1.0 (08/15/2008)    Reviewed HgBA1c results: 11.7 (02/21/2009)  11.6 (08/15/2008)  Problem # 3:  G E R D (ICD-530.81) stable His updated medication list for this problem includes:    Dexilant 30 Mg Cpdr (Dexlansoprazole) .Marland KitchenMarland KitchenMarland KitchenMarland Kitchen  One by mouth daily  Labs Reviewed: Hgb: 13.1 (08/15/2008)   Hct: 37.7 (08/15/2008)  Complete Medication List: 1)  Dexilant 30 Mg Cpdr (Dexlansoprazole) .... One by mouth daily 2)  Gabapentin 300 Mg Caps (Gabapentin) .... Two times a day 3)  Bayer Low Strength 81 Mg Tbec (Aspirin) .... Once daily 4)  Diphenhydramine Hcl 25 Mg Caps (Diphenhydramine hcl) .... As needed 5)  Spironolactone 25 Mg Tabs (Spironolactone) .... One by mouth daily 6)  Tramadol Hcl 50 Mg Tabs (Tramadol hcl) .Marland KitchenMarland Kitchen. 1 with tylenol 325 every 6-8 hours as needed pain 7)  Multivitamins Caps (Multiple vitamin) .Marland Kitchen.. 1 once daily 8)  Calcium 600/vitamin D 600-400 Mg-unit Tabs (Calcium carbonate-vitamin d) .Marland Kitchen.. 1 once daily 9)  Furosemide 40 Mg Tabs (Furosemide) .Marland Kitchen.. 1 by mouth daily as needed 10)  Glimepiride 4 Mg Tabs (Glimepiride) .Marland Kitchen.. 1 tab once daily 11)  Bystolic 5 Mg Tabs (Nebivolol hcl) .Marland Kitchen.. 1 once  daily 12)  Crestor 20 Mg Tabs (Rosuvastatin calcium) .... One every monday and friday 13)  Kombiglyze Xr 2.05-998 Mg Xr24h-tab (Saxagliptin-metformin) .... One by mouth daily ( replaced the 1000/5) 14)  Accu-chek Aviva Strp (Glucose blood) .Marland Kitchen.. 1 three times a day 15)  Lantus Solostar 100 Unit/ml Soln (Insulin glargine) .... 20 u  in abdomal  area daily  Patient Instructions: 1)  20 u of lantus daily  2)  kombiglyze 1000/2.5 one daily 3)  and the dexilant one daily 4)  Please schedule a follow-up appointment in 6 weeks. 5)  HbgA1C prior to visit, ICD-9:

## 2010-02-06 NOTE — Progress Notes (Signed)
Summary: samples  Phone Note Call from Patient   Caller: Patient Call For: Ricard Dillon MD Summary of Call: kombiglyze XR 2.05/998 .......Marland Kitchenneeds more samples. [id number] Initial call taken by: Deanna Artis CMA,  June 20, 2009 8:55 AM  Follow-up for Phone Call        pt is aware samples up front will pick up 06-21-2009 Follow-up by: Glo Herring,  June 20, 2009 4:32 PM

## 2010-02-06 NOTE — Progress Notes (Signed)
Summary: Pt called and is needing more samples of kombiglyze XR 2.05/998  Phone Note Call from Patient Call back at Home Phone [phone]   Caller: Patient Summary of Call: Pt called and is needing more samples of kombiglyze XR 2.05/998 to last until his ov on 08/14/09 with Dr Arnoldo Morale. Pt is completely out of samples. Pt estimates he will need 11 boxes to get to the ov date.    Initial call taken by: Braulio Bosch,  July 14, 2009 8:18 AM  Follow-up for Phone Call        only 3 boxes given Follow-up by: Westley Hummer CMA Deborra Medina),  July 14, 2009 8:54 AM

## 2010-02-06 NOTE — Progress Notes (Signed)
  Phone Note Call from Patient   Caller: Patient Call For: Ricard Dillon MD Reason for Call: Acute Illness Summary of Call: was given Janumet for diabetes and is out; BS's running 300 and over. Should he get Rx or other samples? Has appt 2/21.  Initial call taken by: Chipper Oman, RN,  February 16, 2009 10:48 AM  Follow-up for Phone Call        change to janumet 50-1000 two times a day per dr Arnoldo Morale and give sam    New/Updated Medications: JANUMET 50-1000 MG TABS (SITAGLIPTIN-METFORMIN HCL) 1 two times a day  Appended Document:  pt informed

## 2010-02-06 NOTE — Assessment & Plan Note (Signed)
Summary: 2 MNTH ROV//SLM/PT RSC/CJR   Vital Signs:  Patient profile:   77 year old male Height:      70 inches Weight:      262 pounds BMI:     37.73 Temp:     98.2 degrees F oral Pulse rate:   80 / minute Resp:     14 per minute BP sitting:   146 / 84  (left arm)  Vitals Entered By: Allyne Gee, LPN (February 21, 624THL 10:56 AM) CC: roa labs   Primary Care Provider:  Ricard Dillon MD  CC:  roa labs.  History of Present Illness: DM follow up with elevation of glucose the pt thinks that his glucose have been coming down weight has increased 2 pounds wieght loss id the key to long term control he has not been exercize  Diabetes Management History:      The patient is a 77 years old male who comes in for evaluation of DM Type 2.  He is (or has been) enrolled in the "Diabetic Education Program".  He states understanding of dietary principles.        Hypoglycemic symptoms are not occurring.  Hyperglycemic symptoms include polyuria, polydipsia, and blurred vision.  Frequency of hyperglycemic symptoms are reported to be very frequently.        There are no symptoms to suggest diabetic complications.  Other questions/concerns include: need diet instructons.  The following treatment plan problems are reported: diet problems.  No changes have been made to his treatment plan since last visit.    Preventive Screening-Counseling & Management  Alcohol-Tobacco     Smoking Status: quit  Caffeine-Diet-Exercise     Type of exercise: walking     Exercise (avg: min/session): 15 min.  Problems Prior to Update: 1)  Carotid Artery Disease  (ICD-433.10) 2)  Acute Maxillary Sinusitis  (ICD-461.0) 3)  Peritonsillar Abscess  (ICD-475) 4)  Acute Pharyngitis  (ICD-462) 5)  Cad, Native Vessel  (ICD-414.01) 6)  Hypertension, Heart Controlled w/o Assoc CHF  (ICD-402.10) 7)  G E R D  (ICD-530.81) 8)  Diabetes Mellitus, Type II  (ICD-250.00) 9)  Preventive Health Care  (ICD-V70.0) 10)   Morbid Obesity  (ICD-278.01) 19)  Family History of Cad Male 1st Degree Relative <60  (ICD-V16.49) 12)  Hyperlipidemia  (ICD-272.4) 13)  COPD  (ICD-496) 14)  Family History of Cad Male 1st Degree Relative <50  (ICD-V17.3) 15)  Osteoarthritis  (ICD-715.90) 16)  Hypertension  (ICD-401.9) 17)  Coronary Artery Disease  (ICD-414.00)  Current Problems (verified): 1)  Carotid Artery Disease  (ICD-433.10) 2)  Acute Maxillary Sinusitis  (ICD-461.0) 3)  Peritonsillar Abscess  (ICD-475) 4)  Acute Pharyngitis  (ICD-462) 5)  Cad, Native Vessel  (ICD-414.01) 6)  Hypertension, Heart Controlled w/o Assoc CHF  (ICD-402.10) 7)  G E R D  (ICD-530.81) 8)  Diabetes Mellitus, Type II  (ICD-250.00) 9)  Preventive Health Care  (ICD-V70.0) 10)  Morbid Obesity  (ICD-278.01) 11)  Family History of Cad Male 1st Degree Relative <60  (ICD-V16.49) 12)  Hyperlipidemia  (ICD-272.4) 13)  COPD  (ICD-496) 14)  Family History of Cad Male 1st Degree Relative <50  (ICD-V17.3) 15)  Osteoarthritis  (ICD-715.90) 16)  Hypertension  (ICD-401.9) 17)  Coronary Artery Disease  (ICD-414.00)  Medications Prior to Update: 1)  Prevacid 30 Mg Cpdr (Lansoprazole) .... Two Times A Day 2)  Gabapentin 300 Mg  Caps (Gabapentin) .... Two Times A Day 3)  Bayer Low Strength 81 Mg  Tbec (Aspirin) .... Once Daily 4)  Diphenhydramine Hcl 25 Mg  Caps (Diphenhydramine Hcl) .... As Needed 5)  Spironolactone 25 Mg  Tabs (Spironolactone) .... One By Mouth Daily 6)  Tramadol Hcl 50 Mg Tabs (Tramadol Hcl) .Marland Kitchen.. 1 With Tylenol 325 Every 6-8 Hours As Needed Pain 7)  Multivitamins   Caps (Multiple Vitamin) .Marland Kitchen.. 1 Once Daily 8)  Calcium 600/vitamin D 600-400 Mg-Unit  Tabs (Calcium Carbonate-Vitamin D) .Marland Kitchen.. 1 Once Daily 9)  Furosemide 40 Mg Tabs (Furosemide) .Marland Kitchen.. 1 By Mouth Daily As Needed 10)  Glimepiride 4 Mg Tabs (Glimepiride) .Marland Kitchen.. 1 Tab Once Daily 11)  Bystolic 5 Mg Tabs (Nebivolol Hcl) .Marland Kitchen.. 1 Once Daily 12)  Crestor 20 Mg Tabs (Rosuvastatin  Calcium) .... One Every Monday and Friday 13)  Janumet 50-1000 Mg Tabs (Sitagliptin-Metformin Hcl) .Marland Kitchen.. 1 Two Times A Day 14)  Fenofibrate Micronized 134 Mg Caps (Fenofibrate Micronized) .Marland Kitchen.. 1 Once Daily  Current Medications (verified): 1)  Prevacid 30 Mg Cpdr (Lansoprazole) .... Two Times A Day 2)  Gabapentin 300 Mg  Caps (Gabapentin) .... Two Times A Day 3)  Bayer Low Strength 81 Mg  Tbec (Aspirin) .... Once Daily 4)  Diphenhydramine Hcl 25 Mg  Caps (Diphenhydramine Hcl) .... As Needed 5)  Spironolactone 25 Mg  Tabs (Spironolactone) .... One By Mouth Daily 6)  Tramadol Hcl 50 Mg Tabs (Tramadol Hcl) .Marland Kitchen.. 1 With Tylenol 325 Every 6-8 Hours As Needed Pain 7)  Multivitamins   Caps (Multiple Vitamin) .Marland Kitchen.. 1 Once Daily 8)  Calcium 600/vitamin D 600-400 Mg-Unit  Tabs (Calcium Carbonate-Vitamin D) .Marland Kitchen.. 1 Once Daily 9)  Furosemide 40 Mg Tabs (Furosemide) .Marland Kitchen.. 1 By Mouth Daily As Needed 10)  Glimepiride 4 Mg Tabs (Glimepiride) .Marland Kitchen.. 1 Tab Once Daily 11)  Bystolic 5 Mg Tabs (Nebivolol Hcl) .Marland Kitchen.. 1 Once Daily 12)  Crestor 20 Mg Tabs (Rosuvastatin Calcium) .... One Every Monday and Friday 13)  Janumet 50-1000 Mg Tabs (Sitagliptin-Metformin Hcl) .Marland Kitchen.. 1 Two Times A Day 14)  Fenofibrate Micronized 134 Mg Caps (Fenofibrate Micronized) .Marland Kitchen.. 1 Once Daily  Allergies (verified): 1)  ! Pcn 2)  ! Codeine  Past History:  Family History: Last updated: 09/15/2006 Family History of CAD Male 1st degree relative <50 Family History of CAD Male 1st degree relative <60  Social History: Last updated: 09/15/2006 Retired Married Former Smoker  Risk Factors: Smoking Status: quit (02/27/2009)  Past medical, surgical, family and social histories (including risk factors) reviewed, and no changes noted (except as noted below).  Past Medical History: Reviewed history from 09/02/2007 and no changes required. Coronary artery disease Hypertension Osteoarthritis-shoulder Restless leg Elevated  lipids DM COPD Hyperlipidemia Nephrolithiasis SVT Dyspnea Diabetes mellitus, type II G E R D  Current Meds:  METFORMIN HCL 500 MG  TABS (METFORMIN HCL) two times a day PRAVACHOL 40 MG  TABS (PRAVASTATIN SODIUM) 1/2 tab by mouth once daily DUETACT 30-4 MG  TABS (PIOGLITAZONE HCL-GLIMEPIRIDE) once daily PROTONIX 40 MG  TBEC (PANTOPRAZOLE SODIUM) 1 once daily [BMN] LYRICA 75 MG  CAPS (PREGABALIN) two times a day BAYER LOW STRENGTH 81 MG  TBEC (ASPIRIN) once daily DIPHENHYDRAMINE HCL 25 MG  CAPS (DIPHENHYDRAMINE HCL) as needed SPIRONOLACTONE 25 MG  TABS (SPIRONOLACTONE) one by mouth daily Coreg 12.5mg  by mouth two times a day  ALLI 60 MG  CAPS (ORLISTAT) one before meals DARVOCET-N 100 100-650 MG  TABS (PROPOXYPHENE N-APAP) one by mouth q 6 hour prn  Allergies: PCN, CODEINE.    Past Surgical History: Reviewed history from  07/01/2006 and no changes required. Cholecystectomy  Family History: Reviewed history from 09/15/2006 and no changes required. Family History of CAD Male 1st degree relative <50 Family History of CAD Male 1st degree relative <60  Social History: Reviewed history from 09/15/2006 and no changes required. Retired Married Former Smoker  Review of Systems  The patient denies anorexia, fever, weight loss, weight gain, vision loss, decreased hearing, hoarseness, chest pain, syncope, dyspnea on exertion, peripheral edema, prolonged cough, headaches, hemoptysis, abdominal pain, melena, hematochezia, severe indigestion/heartburn, hematuria, incontinence, genital sores, muscle weakness, suspicious skin lesions, transient blindness, difficulty walking, depression, unusual weight change, abnormal bleeding, enlarged lymph nodes, angioedema, and breast masses.    Physical Exam  General:  patient is alert nontoxic in appearance Head:  normocephalic.   Eyes:  pupils equal and pupils round.   Ears:  R ear normal and L ear normal.   Nose:  no external deformity and no  nasal discharge.   Neck:  supple with tender anterior cervical adenopathy right greater than left. Lungs:  normal respiratory effort and no wheezes.   Heart:  normal rate and regular rhythm.   Abdomen:  soft and non-tender.     Impression & Recommendations:  Problem # 1:  DIABETES MELLITUS, TYPE II (ICD-250.00)  His updated medication list for this problem includes:    Bayer Low Strength 81 Mg Tbec (Aspirin) ..... Once daily    Glimepiride 4 Mg Tabs (Glimepiride) .Marland Kitchen... 1 tab once daily    Kombiglyze Xr 05-998 Mg Xr24h-tab (Saxagliptin-metformin) ..... One by mouth daily  Labs Reviewed: Creat: 1.0 (08/15/2008)    Reviewed HgBA1c results: 11.7 (02/21/2009)  11.6 (08/15/2008)  Complete Medication List: 1)  Prevacid 30 Mg Cpdr (Lansoprazole) .... Two times a day 2)  Gabapentin 300 Mg Caps (Gabapentin) .... Two times a day 3)  Bayer Low Strength 81 Mg Tbec (Aspirin) .... Once daily 4)  Diphenhydramine Hcl 25 Mg Caps (Diphenhydramine hcl) .... As needed 5)  Spironolactone 25 Mg Tabs (Spironolactone) .... One by mouth daily 6)  Tramadol Hcl 50 Mg Tabs (Tramadol hcl) .Marland KitchenMarland Kitchen. 1 with tylenol 325 every 6-8 hours as needed pain 7)  Multivitamins Caps (Multiple vitamin) .Marland Kitchen.. 1 once daily 8)  Calcium 600/vitamin D 600-400 Mg-unit Tabs (Calcium carbonate-vitamin d) .Marland Kitchen.. 1 once daily 9)  Furosemide 40 Mg Tabs (Furosemide) .Marland Kitchen.. 1 by mouth daily as needed 10)  Glimepiride 4 Mg Tabs (Glimepiride) .Marland Kitchen.. 1 tab once daily 11)  Bystolic 5 Mg Tabs (Nebivolol hcl) .Marland Kitchen.. 1 once daily 12)  Crestor 20 Mg Tabs (Rosuvastatin calcium) .... One every monday and friday 13)  Kombiglyze Xr 05-998 Mg Xr24h-tab (Saxagliptin-metformin) .... One by mouth daily  Diabetes Management Assessment/Plan:      The following lipid goals have been established for the patient: Total cholesterol goal of 200; LDL cholesterol goal of 100; HDL cholesterol goal of 40; Triglyceride goal of 150.  His blood pressure goal is < 130/80.     Patient Instructions: 1)  Please schedule a follow-up appointment in 1 month. 2)  dexilant  one a dayis the same as prevacid 30 two times a day

## 2010-02-08 NOTE — Progress Notes (Signed)
  Phone Note Call from Patient   Caller: Patient Call For: Ricard Dillon MD Reason for Call: Acute Illness Summary of Call: [facility] [phone] Sinus congestion, cough, and sore throat.  Needs RX. Initial call taken by: [facility] CMA AAMA,  December 25, 2009 8:05 AM  Follow-up for Phone Call        may have z pack per dr Arnoldo Morale Follow-up by: Allyne Gee, LPN,  December 19, 624THL 8:55 AM    New/Updated Medications: ZITHROMAX Z-PAK 250 MG TABS (AZITHROMYCIN) as directed Prescriptions: ZITHROMAX Z-PAK 250 MG TABS (AZITHROMYCIN) as directed  #6 x 0   Entered by:   Deanna Artis CMA AAMA   Authorized by:   Ricard Dillon MD   Signed by:   Deanna Artis CMA AAMA on 12/25/2009   Method used:   Electronically to        [id number] (retail)       [address]       [place], [place]  [address]       Ph: [phone]       Fax: [phone]   [place]:   [phone]  Notified pt.

## 2010-02-08 NOTE — Assessment & Plan Note (Signed)
Summary: 2 month rov/njr   Vital Signs:  Patient profile:   77 year old male Height:      70 inches Weight:      268 pounds BMI:     38.59 Temp:     98.2 degrees F oral Pulse rate:   88 / minute Resp:     16 per minute BP sitting:   134 / 66  (left arm) Cuff size:   large  Vitals Entered By: Allyne Gee, LPN (January 11, [id number] 11:36 AM) CC: roa- discuss meds, Hypertension Management, Lipid Management Is Patient Diabetic? Yes Did you bring your meter with you today? No  Does patient need assistance? Functional Status Cook/clean   Primary Care Provider:  Ricard Dillon MD  CC:  roa- discuss meds, Hypertension Management, and Lipid Management.  History of Present Illness: Had the URI as his wife did with cold symptoms and cough The zpack did not help Congestion is improving. The pt saw Dr Consuello Masse ( neurosurgery) Back pain and neck pain. The pain 1s intermitnat.. he is now on diclofenac two times a day The pt is on gabepentum and wanted him to increased to TID  Hypertension History:      He denies headache, chest pain, palpitations, dyspnea with exertion, orthopnea, PND, peripheral edema, visual symptoms, neurologic problems, syncope, and side effects from treatment.        Positive major cardiovascular risk factors include male age 58 years old or older, diabetes, hyperlipidemia, and hypertension.  Negative major cardiovascular risk factors include non-tobacco-user status.        Positive history for target organ damage include ASHD (either angina/prior MI/prior CABG) and peripheral vascular disease.  Further assessment for target organ damage reveals no history of stroke/TIA.    Lipid Management History:      Positive NCEP/ATP III risk factors include male age 38 years old or older, diabetes, HDL cholesterol less than 40, hypertension, ASHD (either angina/prior MI/prior CABG), and peripheral vascular disease.  Negative NCEP/ATP III risk factors include non-tobacco-user  status, no prior stroke/TIA, and no history of aortic aneurysm.      Preventive Screening-Counseling & Management  Alcohol-Tobacco     Smoking Status: quit  Problems Prior to Update: 1)  Sinusitis, Acute  (ICD-461.9) 2)  Acute Bronchitis  (ICD-466.0) 3)  Back Pain  (ICD-724.5) 4)  Fatigue  (ICD-780.79) 5)  Insomnia Unspecified  (ICD-780.52) 6)  Basal Cell Carcinoma, Nose  (ICD-173.3) 7)  Leg Cramps, Idiopathic  (ICD-729.82) 8)  Polyuria  (ICD-788.42) 9)  Obesity  (ICD-278.00) 10)  Edema  (ICD-782.3) 11)  Carotid Artery Disease  (ICD-433.10) 12)  Acute Maxillary Sinusitis  (ICD-461.0) 13)  Peritonsillar Abscess  (ICD-475) 14)  Acute Pharyngitis  (ICD-462) 15)  Cad, Native Vessel  (ICD-414.01) 16)  Hypertension, Heart Controlled w/o Assoc CHF  (ICD-402.10) 17)  G E R D  (ICD-530.81) 18)  Diabetes Mellitus, Type II  (ICD-250.00) 19)  Preventive Health Care  (ICD-V70.0) 20)  Morbid Obesity  (ICD-278.01) 21)  Family History of Cad Male 1st Degree Relative <60  (ICD-V16.49) 22)  Hyperlipidemia  (ICD-272.4) 23)  COPD  (ICD-496) 24)  Family History of Cad Male 1st Degree Relative <50  (ICD-V17.3) 25)  Osteoarthritis  (ICD-715.90) 26)  Hypertension  (ICD-401.9) 27)  Coronary Artery Disease  (ICD-414.00)  Current Problems (verified): 1)  Sinusitis, Acute  (ICD-461.9) 2)  Acute Bronchitis  (ICD-466.0) 3)  Back Pain  (ICD-724.5) 4)  Fatigue  (ICD-780.79) 5)  Insomnia Unspecified  (  ICD-780.52) 6)  Basal Cell Carcinoma, Nose  (ICD-173.3) 7)  Leg Cramps, Idiopathic  (ICD-729.82) 8)  Polyuria  (ICD-788.42) 9)  Obesity  (ICD-278.00) 10)  Edema  (ICD-782.3) 11)  Carotid Artery Disease  (ICD-433.10) 12)  Acute Maxillary Sinusitis  (ICD-461.0) 13)  Peritonsillar Abscess  (ICD-475) 14)  Acute Pharyngitis  (ICD-462) 15)  Cad, Native Vessel  (ICD-414.01) 16)  Hypertension, Heart Controlled w/o Assoc CHF  (ICD-402.10) 17)  G E R D  (ICD-530.81) 18)  Diabetes Mellitus, Type II   (ICD-250.00) 19)  Preventive Health Care  (ICD-V70.0) 20)  Morbid Obesity  (ICD-278.01) 21)  Family History of Cad Male 1st Degree Relative <60  (ICD-V16.49) 22)  Hyperlipidemia  (ICD-272.4) 23)  COPD  (ICD-496) 24)  Family History of Cad Male 1st Degree Relative <50  (ICD-V17.3) 25)  Osteoarthritis  (ICD-715.90) 26)  Hypertension  (ICD-401.9) 27)  Coronary Artery Disease  (ICD-414.00)  Medications Prior to Update: 1)  Gabapentin 300 Mg  Caps (Gabapentin) .... Two Times A Day 2)  Bayer Low Strength 81 Mg  Tbec (Aspirin) .... Once Daily 3)  Diphenhydramine Hcl 25 Mg  Caps (Diphenhydramine Hcl) .... As Needed 4)  Spironolactone 25 Mg  Tabs (Spironolactone) .... One By Mouth Daily 5)  Multivitamins   Caps (Multiple Vitamin) .Marland Kitchen.. 1 Once Daily 6)  Calcium 600/vitamin D 600-400 Mg-Unit  Tabs (Calcium Carbonate-Vitamin D) .Marland Kitchen.. 1 Once Daily 7)  Furosemide 40 Mg Tabs (Furosemide) .Marland Kitchen.. 1 By Mouth Daily As Needed 8)  Glimepiride 4 Mg Tabs (Glimepiride) .Marland Kitchen.. 1 Tab Once Daily 9)  Accu-Chek Aviva  Strp (Glucose Blood) .Marland Kitchen.. 1 Three Times A Day 10)  Lantus Solostar 100 Unit/ml Soln (Insulin Glargine) .... 32 U  in [place] 2 X Daily, May Increase By 2 Units Every 3 Days If Bs Remains Above 200 11)  Nitrostat 0.4 Mg Subl (Nitroglycerin) .Marland Kitchen.. 1 Tablet Under Tongue At Onset of Chest Pain; You May Repeat Every 5 Minutes For Up To 3 Doses. 12)  Accu-Chek Multiclix Lancets  Misc (Lancets) .Marland Kitchen.. 1 Once Daily 13)  Zolpidem Tartrate 5 Mg Tabs (Zolpidem Tartrate) .Marland Kitchen.. 1 Tab By Mouth At Bedtime As Needed Insomnia 14)  Mobic 7.5 Mg Tabs (Meloxicam) .Marland Kitchen.. 1 Tab By Mouth Once Daily As Needed Pain With Food 15)  Metoprolol Tartrate 50 Mg Tabs (Metoprolol Tartrate) .Marland Kitchen.. 1 Tab By Mouth Once Daily 16)  Prilosec 20 Mg Cpdr (Omeprazole) .Marland Kitchen.. 1 Tab By Mouth Once Daily 17)  Kombiglyze Xr 2.05-998 Mg Xr24h-Tab (Saxagliptin-Metformin) .... One By Mouth Dailt 18)  Pravachol 20 Mg Tabs (Pravastatin Sodium) .Marland Kitchen.. 1 Tab By  Mouth Qhs 19)  Mucinex 600 Mg Xr12h-Tab (Guaifenesin) .Marland Kitchen.. 1 Tab By Mouth Two Times A Day X 10 Days 20)  Methylprednisolone 4 Mg Tabs (Methylprednisolone) .... Three For 3 Days, 2 For 3days The Then 1 For 3 Days Then Stop 21)  Zithromax Z-Pak 250 Mg Tabs (Azithromycin) .... As Directed  Current Medications (verified): 1)  Gabapentin 300 Mg  Caps (Gabapentin) .... One By Mouth Three Times A Day 2)  Bayer Low Strength 81 Mg  Tbec (Aspirin) .... Once Daily 3)  Spironolactone 25 Mg  Tabs (Spironolactone) .... One By Mouth Daily 4)  Multivitamins   Caps (Multiple Vitamin) .Marland Kitchen.. 1 Once Daily 5)  Calcium 600/vitamin D 600-400 Mg-Unit  Tabs (Calcium Carbonate-Vitamin D) .Marland Kitchen.. 1 Once Daily 6)  Furosemide 40 Mg Tabs (Furosemide) .Marland Kitchen.. 1 By Mouth Daily As Needed 7)  Glimepiride 4 Mg Tabs (Glimepiride) .Marland KitchenMarland KitchenMarland Kitchen  1 Tab Once Daily 8)  Accu-Chek Aviva  Strp (Glucose Blood) .Marland Kitchen.. 1 Three Times A Day 9)  Lantus Solostar 100 Unit/ml Soln (Insulin Glargine) .... 32 U  in [place] 2 X Daily, May Increase By 2 Units Every 3 Days If Bs Remains Above 200 10)  Nitrostat 0.4 Mg Subl (Nitroglycerin) .Marland Kitchen.. 1 Tablet Under Tongue At Onset of Chest Pain; You May Repeat Every 5 Minutes For Up To 3 Doses. 11)  Accu-Chek Multiclix Lancets  Misc (Lancets) .Marland Kitchen.. 1 Once Daily 12)  Zolpidem Tartrate 5 Mg Tabs (Zolpidem Tartrate) .Marland Kitchen.. 1 Tab By Mouth At Bedtime As Needed Insomnia 13)  Metoprolol Tartrate 50 Mg Tabs (Metoprolol Tartrate) .Marland Kitchen.. 1 Tab By Mouth Once Daily 14)  Prilosec 20 Mg Cpdr (Omeprazole) .Marland Kitchen.. 1 Tab By Mouth Once Daily 15)  Kombiglyze Xr 2.05-998 Mg Xr24h-Tab (Saxagliptin-Metformin) .... One By Mouth Dailt 16)  Pravachol 20 Mg Tabs (Pravastatin Sodium) .Marland Kitchen.. 1 Tab By Mouth Qhs 17)  Lisinopril 5 Mg Tabs (Lisinopril) .Marland Kitchen.. 1 Once Daily 18)  Diclofenac Sodium 75 Mg Tbec (Diclofenac Sodium) .Marland Kitchen.. 1 Two Times A Day 19)  Metoprolol Tartrate 50 Mg Tabs (Metoprolol Tartrate) .Marland Kitchen.. 1 Once Daily  Allergies (verified): 1)  ! Pcn 2)   ! Codeine 3)  Ace Inhibitors  Past History:  Family History: Last updated: 08/14/2009 Family History of CAD Male 1st degree relative <50 Family History of CAD Male 1st degree relative <60 Father: deceased@94 , heart disease, migraines, PUD Mother: deceased@88 , Alzheimer's Dementia, heart disease Siblings: Sister: deceased@75 , heart disease, morbid obesity, arthritis Sister: 64,  arthritis, insomnia Brother: 31, smoker, arthritis, heart disease MGM: deceased in 55s, arthritis MGF: 66s, cerebral hemorrhage PGM: deceased young PGF: died in 66s, Alcohol abuse, migraines Children: Son: 31, sleep apnea, obese Daughter: 58, migraines, overweight Daughter: 67: thyroid disease,   Social History: Last updated: 08/14/2009 Retired Married Former Smoker,  quit 2 ppd 30 years ago, started after college No alcohol No illicit drugs  Risk Factors: Smoking Status: quit (01/17/2010)  Past medical, surgical, family and social histories (including risk factors) reviewed, and no changes noted (except as noted below).  Past Medical History: Reviewed history from 04/18/2009 and no changes required. Coronary artery disease, overlapping stents RCA. Last cath May 2008 with moderate disease LAD Hypertension Osteoarthritis-shoulder Restless leg Elevated lipids DM COPD Hyperlipidemia Nephrolithiasis SVT Dyspnea Diabetes mellitus, type II G E R D  Current Meds:  METFORMIN HCL 500 MG  TABS (METFORMIN HCL) two times a day PRAVACHOL 40 MG  TABS (PRAVASTATIN SODIUM) 1/2 tab by mouth once daily DUETACT 30-4 MG  TABS (PIOGLITAZONE HCL-GLIMEPIRIDE) once daily PROTONIX 40 MG  TBEC (PANTOPRAZOLE SODIUM) 1 once daily [BMN] LYRICA 75 MG  CAPS (PREGABALIN) two times a day BAYER LOW STRENGTH 81 MG  TBEC (ASPIRIN) once daily DIPHENHYDRAMINE HCL 25 MG  CAPS (DIPHENHYDRAMINE HCL) as needed SPIRONOLACTONE 25 MG  TABS (SPIRONOLACTONE) one by mouth daily Coreg 12.5mg  by mouth two times a day  ALLI 60  MG  CAPS (ORLISTAT) one before meals DARVOCET-N 100 100-650 MG  TABS (PROPOXYPHENE N-APAP) one by mouth q 6 hour prn  Allergies: PCN, CODEINE.    Past Surgical History: Reviewed history from 07/01/2006 and no changes required. Cholecystectomy  Family History: Reviewed history from 08/14/2009 and no changes required. Family History of CAD Male 1st degree relative <50 Family History of CAD Male 1st degree relative <60 Father: deceased@94 , heart disease, migraines, PUD Mother: deceased@88 , Alzheimer's Dementia, heart disease Siblings: Sister: deceased@75 , heart disease,  morbid obesity, arthritis Sister: 19,  arthritis, insomnia Brother: 30, smoker, arthritis, heart disease MGM: deceased in 21s, arthritis MGF: 78s, cerebral hemorrhage PGM: deceased young PGF: died in 65s, Alcohol abuse, migraines Children: Son: 52, sleep apnea, obese Daughter: 37, migraines, overweight Daughter: 44: thyroid disease,   Social History: Reviewed history from 08/14/2009 and no changes required. Retired Married Former Smoker,  quit 2 ppd 30 years ago, started after college No alcohol No illicit drugs  Review of Systems  The patient denies anorexia, fever, weight loss, weight gain, vision loss, decreased hearing, hoarseness, chest pain, syncope, dyspnea on exertion, peripheral edema, prolonged cough, headaches, hemoptysis, abdominal pain, melena, hematochezia, severe indigestion/heartburn, hematuria, incontinence, genital sores, muscle weakness, suspicious skin lesions, transient blindness, difficulty walking, depression, unusual weight change, abnormal bleeding, enlarged lymph nodes, angioedema, and breast masses.    Physical Exam  General:  well-nourished and overweight-appearing.   Head:  normocephalic and no abnormalities palpated.   Eyes:  pupils equal and pupils round.   Ears:  R ear normal and L ear normal.   Nose:  mucosal erythema and mucosal edema.   Mouth:  posterior lymphoid  hypertrophy and postnasal drip.   Neck:  No deformities, masses, or tenderness noted. Lungs:  Normal respiratory effort, chest expands symmetrically. Lungs are clear to auscultation, no crackles or wheezes. Heart:  Normal rate and regular rhythm. S1 and S2 normal without gallop, murmur, click, rub or other extra sounds. Abdomen:  Bowel sounds positive,abdomen soft and non-tender without masses, organomegaly or hernias noted. Obese   Impression & Recommendations:  Problem # 1:  DIABETES MELLITUS, TYPE II (ICD-250.00) Assessment Deteriorated pt has quit the lantus check a1c and if increased would need to resume the lantus His updated medication list for this problem includes:    Bayer Low Strength 81 Mg Tbec (Aspirin) ..... Once daily    Glimepiride 4 Mg Tabs (Glimepiride) .Marland Kitchen... 1 tab once daily    Lantus Solostar 100 Unit/ml Soln (Insulin glargine) .Marland Kitchen... 32 u  in abdomal  area 2 x daily, may increase by 2 units every 3 days if bs remains above 200    Kombiglyze Xr 2.05-998 Mg Xr24h-tab (Saxagliptin-metformin) ..... One by mouth dailt    Lisinopril 5 Mg Tabs (Lisinopril) .Marland Kitchen... 1 once daily  Orders: Venipuncture [id number]) Specimen Handling (99000) TLB-BMP (Basic Metabolic Panel-BMET) (99991111) TLB-A1C / Hgb A1C (Glycohemoglobin) (83036-A1C)  Labs Reviewed: Creat: 1.3 (08/14/2009)    Reviewed HgBA1c results: 9.8 (08/07/2009)  8.6 (05/08/2009)  Problem # 2:  FATIGUE (ICD-780.79) Assessment: Deteriorated  Problem # 3:  HYPERTENSION, HEART CONTROLLED W/O ASSOC CHF (ICD-402.10) Assessment: Unchanged  His updated medication list for this problem includes:    Spironolactone 25 Mg Tabs (Spironolactone) ..... One by mouth daily    Furosemide 40 Mg Tabs (Furosemide) .Marland Kitchen... 1 by mouth daily as needed    Metoprolol Tartrate 50 Mg Tabs (Metoprolol tartrate) .Marland Kitchen... 1 tab by mouth once daily    Lisinopril 5 Mg Tabs (Lisinopril) .Marland Kitchen... 1 once daily    Metoprolol Tartrate 50 Mg Tabs  (Metoprolol tartrate) .Marland Kitchen... 1 once daily  BP today: 134/66 Prior BP: 140/76 (11/16/2009)  Prior 10 Yr Risk Heart Disease: N/A (01/05/2007)  Labs Reviewed: K+: 4.4 (08/14/2009) Creat: : 1.3 (08/14/2009)   Chol: 252 (08/14/2009)   HDL: 38.20 (08/14/2009)   LDL: DEL (03/02/2007)   TG: 328.0 (08/14/2009)  Problem # 4:  ACUTE BRONCHITIS (ICD-466.0) Assessment: Deteriorated  The following medications were removed from the medication list:    Mucinex  600 Mg Xr12h-tab (Guaifenesin) .Marland Kitchen... 1 tab by mouth two times a day x 10 days    Zithromax Z-pak 250 Mg Tabs (Azithromycin) .Marland Kitchen... As directed  Instructed on treatment. Call if symptoms persist or worsen.   Take antibiotics and other medications as directed. Encouraged to push clear liquids, get enough rest, and take acetaminophen as needed. To be seen in 5-7 days if no improvement, sooner if worse.  Complete Medication List: 1)  Gabapentin 300 Mg Caps (Gabapentin) .... One by mouth three times a day 2)  Bayer Low Strength 81 Mg Tbec (Aspirin) .... Once daily 3)  Spironolactone 25 Mg Tabs (Spironolactone) .... One by mouth daily 4)  Multivitamins Caps (Multiple vitamin) .Marland Kitchen.. 1 once daily 5)  Calcium 600/vitamin D 600-400 Mg-unit Tabs (Calcium carbonate-vitamin d) .Marland Kitchen.. 1 once daily 6)  Furosemide 40 Mg Tabs (Furosemide) .Marland Kitchen.. 1 by mouth daily as needed 7)  Glimepiride 4 Mg Tabs (Glimepiride) .Marland Kitchen.. 1 tab once daily 8)  Accu-chek Aviva Strp (Glucose blood) .Marland Kitchen.. 1 three times a day 9)  Lantus Solostar 100 Unit/ml Soln (Insulin glargine) .... 32 u  in abdomal  area 2 x daily, may increase by 2 units every 3 days if bs remains above 200 10)  Nitrostat 0.4 Mg Subl (Nitroglycerin) .Marland Kitchen.. 1 tablet under tongue at onset of chest pain; you may repeat every 5 minutes for up to 3 doses. 11)  Accu-chek Multiclix Lancets Misc (Lancets) .Marland Kitchen.. 1 once daily 12)  Zolpidem Tartrate 5 Mg Tabs (Zolpidem tartrate) .Marland Kitchen.. 1 tab by mouth at bedtime as needed insomnia 13)   Metoprolol Tartrate 50 Mg Tabs (Metoprolol tartrate) .Marland Kitchen.. 1 tab by mouth once daily 14)  Prilosec 20 Mg Cpdr (Omeprazole) .Marland Kitchen.. 1 tab by mouth once daily 15)  Kombiglyze Xr 2.05-998 Mg Xr24h-tab (Saxagliptin-metformin) .... One by mouth dailt 16)  Pravachol 20 Mg Tabs (Pravastatin sodium) .Marland Kitchen.. 1 tab by mouth qhs 17)  Lisinopril 5 Mg Tabs (Lisinopril) .Marland Kitchen.. 1 once daily 18)  Diclofenac Sodium 75 Mg Tbec (Diclofenac sodium) .Marland Kitchen.. 1 two times a day 19)  Metoprolol Tartrate 50 Mg Tabs (Metoprolol tartrate) .Marland Kitchen.. 1 once daily  Hypertension Assessment/Plan:      The patient's hypertensive risk group is category C: Target organ damage and/or diabetes.  Today's blood pressure is 134/66.  His blood pressure goal is < 130/80.  Lipid Assessment/Plan:      Based on NCEP/ATP III, the patient's risk factor category is "history of coronary disease, peripheral vascular disease, cerebrovascular disease, or aortic aneurysm along with either diabetes, current smoker, or LDL > 130 plus HDL < 40 plus triglycerides > 200".  The patient's lipid goals are as follows: Total cholesterol goal is 200; LDL cholesterol goal is 100; HDL cholesterol goal is 40; Triglyceride goal is 150.    Patient Instructions: 1)  Please schedule a follow-up appointment in 4 months. Prescriptions: GABAPENTIN 300 MG  CAPS (GABAPENTIN) one by mouth three times a day  #270 x 3   Entered and Authorized by:   Ricard Dillon MD   Signed by:   Ricard Dillon MD on 01/17/2010   Method used:   Faxed to ...       [facility]mail-order)             , [place]         Ph:        Fax: [phone]   RxID:   [id number]    Orders Added: 1)  Venipuncture [id number] 2)  Specimen Handling [99000] 3)  TLB-BMP (Basic Metabolic Panel-BMET) [id number] 4)  TLB-A1C / Hgb A1C (Glycohemoglobin) [83036-A1C] 5)  Est. Patient Level IV [id number]

## 2010-02-14 NOTE — Letter (Signed)
Summary: [facility]  [facility]   Imported By: Laural Benes 02/06/2010 10:02:56  _____________________________________________________________________  External Attachment:    Type:   Image     Comment:   External Document

## 2010-03-25 ENCOUNTER — Other Ambulatory Visit: Payer: Self-pay | Admitting: Internal Medicine

## 2010-04-05 ENCOUNTER — Ambulatory Visit (INDEPENDENT_AMBULATORY_CARE_PROVIDER_SITE_OTHER): Payer: MEDICARE | Admitting: Family Medicine

## 2010-04-05 ENCOUNTER — Encounter: Payer: Self-pay | Admitting: Family Medicine

## 2010-04-05 VITALS — BP 112/60 | Temp 98.7°F | Ht 69.5 in | Wt 275.0 lb

## 2010-04-05 DIAGNOSIS — F32A Depression, unspecified: Secondary | ICD-10-CM

## 2010-04-05 DIAGNOSIS — R5381 Other malaise: Secondary | ICD-10-CM

## 2010-04-05 DIAGNOSIS — R5383 Other fatigue: Secondary | ICD-10-CM

## 2010-04-05 DIAGNOSIS — M791 Myalgia, unspecified site: Secondary | ICD-10-CM

## 2010-04-05 DIAGNOSIS — N289 Disorder of kidney and ureter, unspecified: Secondary | ICD-10-CM

## 2010-04-05 DIAGNOSIS — G4700 Insomnia, unspecified: Secondary | ICD-10-CM

## 2010-04-05 DIAGNOSIS — IMO0001 Reserved for inherently not codable concepts without codable children: Secondary | ICD-10-CM

## 2010-04-05 DIAGNOSIS — F329 Major depressive disorder, single episode, unspecified: Secondary | ICD-10-CM

## 2010-04-05 NOTE — Patient Instructions (Signed)
Leave off benadryl for daytime use Consider Allegra one daily. Reduce daytime napping.

## 2010-04-05 NOTE — Progress Notes (Signed)
Subjective:    Patient ID: Gary Yates, male    DOB: 1933/03/29, 77 y.o.   MRN: [id number]  HPI Patient is seen with multiple complaints today including increased fatigue, increased depressive symptoms, increased insomnia, and feeling somewhat off balance with walking over the past several weeks. He has multiple chronic problems including type 2 diabetes, obesity, hyperlipidemia, hypertension, CAD, chronic insomnia. He takes multiple medications which are reviewed. Also takes some over-the-counter medications including Benadryl both day and night.  Increased fatigue progressive over quite some time possibly several months. TSH normal in August. Very poor sleep quality. Frequent daytime napping. Rarely sleeps more than 2-3 hours continuous. Remote history of sleep study many years ago. No observed apnea.  Recent lab work reviewed. Increased BUN and creatinine with last profile compared to previous. No anemia. A1c 9.5%. Diabetes poorly controlled on oral medications.  History of CAD. Followed closely by cardiology. Has some chronic dyspnea with activity it possibly worse recently. No chest pains. No orthopnea. No increased pedal edema  Denies specific stressors. Increased irritability recently. Occasional crying spells. Decreased motivation. No suicidal ideation. No definite history of prior major depression.   Review of Systems  Constitutional: Positive for fatigue. Negative for fever, chills, diaphoresis and appetite change.  HENT: Negative for trouble swallowing and neck pain.   Respiratory: Positive for shortness of breath. Negative for cough, wheezing and stridor.   Cardiovascular: Negative for chest pain, palpitations and leg swelling.  Gastrointestinal: Negative for abdominal pain, blood in stool and abdominal distention.  Genitourinary: Negative for dysuria and hematuria.  Musculoskeletal: Positive for myalgias.  Skin: Negative for rash.  Neurological: Negative for syncope and  headaches.  Hematological: Negative for adenopathy.  Psychiatric/Behavioral: Positive for sleep disturbance, dysphoric mood and decreased concentration. Negative for suicidal ideas, hallucinations and confusion. The patient is nervous/anxious.        Objective:   Physical Exam  Constitutional: He is oriented to person, place, and time. He appears well-developed and well-nourished. No distress.  HENT:  Head: Normocephalic and atraumatic.  Right Ear: External ear normal.  Left Ear: External ear normal.  Mouth/Throat: No oropharyngeal exudate.  Eyes: Pupils are equal, round, and reactive to light.  Neck: Neck supple. No thyromegaly present.  Cardiovascular: Normal rate, regular rhythm and normal heart sounds.   No murmur heard. Pulmonary/Chest: Effort normal and breath sounds normal. He has no wheezes. He has no rales.  Abdominal: Soft. Bowel sounds are normal. He exhibits no distension and no mass. There is no tenderness. There is no rebound and no guarding.  Musculoskeletal: He exhibits no edema.  Lymphadenopathy:    He has no cervical adenopathy.  Neurological: He is alert and oriented to person, place, and time. No cranial nerve deficit. Coordination normal.  Skin: No rash noted.  Psychiatric: Judgment and thought content normal.       Somewhat depressed mood          Assessment & Plan:  #1 increased fatigue. Likely multifactorial. Poor sleep quality. Daytime use of diphenhydramine. Increase risk of obstructive sleep apnea. Has some definite depressive symptoms. Polypharmacy but no recent medication changes. #2 type 2 diabetes poorly controlled #3 increased depressive symptoms #4 chronic kidney disease with recent creatinine 2.2 which is increased from previous #5 chronic insomnia #6 hypertension adequately controlled  Check labs with basic metabolic panel, early AM testosterone level, and vitamin D level. Suggested leaving off daytime diphenhydramine and avoidance of  daytime naps. Will probably benefit from antidepressant and will discuss with primary.

## 2010-04-06 ENCOUNTER — Other Ambulatory Visit (INDEPENDENT_AMBULATORY_CARE_PROVIDER_SITE_OTHER): Payer: MEDICARE | Admitting: Internal Medicine

## 2010-04-06 DIAGNOSIS — R5383 Other fatigue: Secondary | ICD-10-CM

## 2010-04-06 DIAGNOSIS — E349 Endocrine disorder, unspecified: Secondary | ICD-10-CM

## 2010-04-06 DIAGNOSIS — R5381 Other malaise: Secondary | ICD-10-CM

## 2010-04-06 DIAGNOSIS — E119 Type 2 diabetes mellitus without complications: Secondary | ICD-10-CM

## 2010-04-06 DIAGNOSIS — N289 Disorder of kidney and ureter, unspecified: Secondary | ICD-10-CM

## 2010-04-06 DIAGNOSIS — M791 Myalgia, unspecified site: Secondary | ICD-10-CM

## 2010-04-06 LAB — BASIC METABOLIC PANEL
BUN: 21 mg/dL (ref 6–23)
CO2: 25 mEq/L (ref 19–32)
Calcium: 10.1 mg/dL (ref 8.4–10.5)
Chloride: 105 mEq/L (ref 96–112)
Creatinine, Ser: 1.9 mg/dL — ABNORMAL HIGH (ref 0.4–1.5)
GFR: 37.35 mL/min — ABNORMAL LOW (ref >60.00)
Glucose, Bld: 245 mg/dL — ABNORMAL HIGH (ref 70–99)
Potassium: 4.6 mEq/L (ref 3.5–5.1)
Sodium: 137 mEq/L (ref 135–145)

## 2010-04-06 NOTE — Progress Notes (Signed)
Quick Note:  Spoke with pt - informed of labs and the need for f/u with dr. Arnoldo Morale . Pt will call on Monday to do rov to discuss labs with dr. Arnoldo Morale ______

## 2010-04-07 LAB — VITAMIN D 25 HYDROXY (VIT D DEFICIENCY, FRACTURES): Vit D, 25-Hydroxy: 28 ng/mL — ABNORMAL LOW (ref 30–89)

## 2010-04-09 ENCOUNTER — Telehealth: Payer: Self-pay

## 2010-04-09 LAB — GLUCOSE, CAPILLARY
Glucose-Capillary: 231 mg/dL — ABNORMAL HIGH (ref 70–99)
Glucose-Capillary: 267 mg/dL — ABNORMAL HIGH (ref 70–99)

## 2010-04-09 NOTE — Telephone Encounter (Signed)
Per dr Arnoldo Morale- n eeds ov to come in and discuss-- pt was informed this is a chronic condition and has probably had for a while- low testosterone and low vitamin d-dr Arnoldo Morale out of the office for 2 weeks and pt doesn't want to wait- infomred he can make appointment with dr Elease Hashimoto and discuss if he likes

## 2010-04-09 NOTE — Telephone Encounter (Signed)
Pt saw Dr. Elease Hashimoto last week on Thursday and was advised to make a f/u appt with Dr. Arnoldo Morale due to some abnormal labs.

## 2010-04-10 NOTE — Progress Notes (Signed)
Quick Note:  Pt informed, has OV tomorrow to discuss ______

## 2010-04-11 ENCOUNTER — Ambulatory Visit (INDEPENDENT_AMBULATORY_CARE_PROVIDER_SITE_OTHER): Payer: MEDICARE | Admitting: Family Medicine

## 2010-04-11 ENCOUNTER — Encounter: Payer: Self-pay | Admitting: Family Medicine

## 2010-04-11 VITALS — BP 120/72 | Temp 98.5°F | Ht 69.5 in | Wt 274.0 lb

## 2010-04-11 DIAGNOSIS — F32A Depression, unspecified: Secondary | ICD-10-CM

## 2010-04-11 DIAGNOSIS — F329 Major depressive disorder, single episode, unspecified: Secondary | ICD-10-CM

## 2010-04-11 DIAGNOSIS — E291 Testicular hypofunction: Secondary | ICD-10-CM

## 2010-04-11 DIAGNOSIS — E119 Type 2 diabetes mellitus without complications: Secondary | ICD-10-CM

## 2010-04-11 MED ORDER — TESTOSTERONE CYPIONATE 200 MG/ML IM SOLN: 200.0000 mg | INTRAMUSCULAR | Status: DC

## 2010-04-11 MED ORDER — TESTOSTERONE CYPIONATE 200 MG/ML IM SOLN
200.0000 mg | INTRAMUSCULAR | Status: DC
  Administered 2010-04-11: 200 mg via INTRAMUSCULAR

## 2010-04-11 NOTE — Progress Notes (Signed)
  Subjective:    Patient ID: Gary Yates, male    DOB: 31-Oct-1933, 77 y.o.   MRN: [id number]  HPI Patient is scheduled to followup with me in primary physician's absence. Refer to recent visit. Increased depressive symptoms and fatigue. Low testosterone of 215. Minimally low vitamin D level. Patient has decreased energy, decreased motivation, depressed mood and low libido. He had some recent anxiety symptoms and took his wife's Xanax 0.5 mg was helps somewhat.  No suicidal ideation.  Patient had normal PSA back in August and normal TSH then as well  Type 2 diabetes poorly controlled. Takes combination with metformin and onglyza.  Recent creatinine 1.9. Takes Lantus insulin 40 units twice daily.   Review of Systems  Constitutional: Positive for fatigue. Negative for fever, activity change and appetite change.  Respiratory: Negative for cough and shortness of breath.   Cardiovascular: Negative for chest pain, palpitations and leg swelling.  Gastrointestinal: Negative for abdominal pain.  Skin: Negative for rash.  Neurological: Positive for weakness. Negative for seizures, syncope and headaches.  Hematological: Negative for adenopathy.  Psychiatric/Behavioral: Positive for dysphoric mood. Negative for confusion and agitation. The patient is nervous/anxious.        Objective:   Physical Exam  Constitutional: He is oriented to person, place, and time. He appears well-developed and well-nourished. No distress.  Eyes: Pupils are equal, round, and reactive to light.  Neck: Normal range of motion. Neck supple.  Cardiovascular: Normal rate and regular rhythm.  Exam reveals no gallop.   Pulmonary/Chest: Effort normal and breath sounds normal. He has no wheezes. He has no rales.  Musculoskeletal: He exhibits no edema.  Lymphadenopathy:    He has no cervical adenopathy.  Neurological: He is alert and oriented to person, place, and time. No cranial nerve deficit.  Skin: No rash noted.           Assessment & Plan:  #1 low testosterone. We explained this may have impact on physical symptoms and mood. Discussed replacement. Recent PSA normal. Patient prefers to start with injection. Testosterone cypionate 200 mg per mL one mL IM and patient encouraged to schedule followup with primary within one month #2 depressive symptoms possibly exacerbated by #1. Consider antidepressant if not improving with replacement of testosterone #3 type 2 diabetes poorly controlled. Patient given titration regimen of Lantus 2 units every 3 days until fasting blood sugars around 130

## 2010-05-04 ENCOUNTER — Telehealth: Payer: Self-pay | Admitting: Internal Medicine

## 2010-05-04 NOTE — Telephone Encounter (Signed)
We do not c omplete forms for non state diabetic supply forms..unless pt calls and tells [place] they have talked with them and wants to use them.. These com panies call people on a list to solicit.. They have called non diabetic pts

## 2010-05-04 NOTE — Telephone Encounter (Signed)
[employer] called to check status of req that was faxed over for pts diabetes testing supplies. Pls complete and fax back form asap to fax # [phone]

## 2010-05-07 ENCOUNTER — Ambulatory Visit (INDEPENDENT_AMBULATORY_CARE_PROVIDER_SITE_OTHER): Payer: MEDICARE | Admitting: Family Medicine

## 2010-05-07 ENCOUNTER — Encounter: Payer: Self-pay | Admitting: Family Medicine

## 2010-05-07 DIAGNOSIS — E349 Endocrine disorder, unspecified: Secondary | ICD-10-CM

## 2010-05-07 DIAGNOSIS — E119 Type 2 diabetes mellitus without complications: Secondary | ICD-10-CM

## 2010-05-07 DIAGNOSIS — E291 Testicular hypofunction: Secondary | ICD-10-CM

## 2010-05-07 MED ORDER — TESTOSTERONE CYPIONATE 200 MG/ML IM SOLN
200.0000 mg | INTRAMUSCULAR | Status: DC
  Administered 2010-05-07: 200 mg via INTRAMUSCULAR

## 2010-05-07 NOTE — Progress Notes (Signed)
  Subjective:    Patient ID: Gary Yates, male    DOB: 26-Sep-1933, 77 y.o.   MRN: [id number]  HPI Patient seen for followup. Had had problems with increased fatigue and depression.  Low motivation and low libido with decrease in stamina and energy. Low testosterone at 215. On testosterone replacement and has seen great improvement in mood and energy. More initiative to complete projects. Overall sense of well being is greatly improved.  Type 2 diabetes with history of poor control. Recent fasting blood sugars vary considerably. Ranging between 110 and 215. Medications reviewed. No hypoglycemic symptoms. Requesting samples of Kombiglyze.  No past medical history on file. No past surgical history on file.  reports that he quit smoking about 27 years ago. His smoking use included Cigarettes. He has a 30 pack-year smoking history. He does not have any smokeless tobacco history on file. His alcohol and drug histories not on file. family history is not on file. Allergies  Allergen Reactions  . Ace Inhibitors     REACTION: cough  . Codeine     REACTION: Itch  . Penicillins       Review of Systems  Constitutional: Negative for fever, chills, activity change, appetite change and unexpected weight change.  Respiratory: Negative for cough and shortness of breath.   Cardiovascular: Negative for chest pain, palpitations and leg swelling.  Neurological: Negative for dizziness, syncope and headaches.       Objective:   Physical Exam  Constitutional: He is oriented to person, place, and time. He appears well-developed and well-nourished.  Neck: No thyromegaly present.  Cardiovascular: Normal rate, regular rhythm and normal heart sounds.   Pulmonary/Chest: Effort normal and breath sounds normal. No respiratory distress. He has no wheezes. He has no rales.  Musculoskeletal: He exhibits no edema.  Lymphadenopathy:    He has no cervical adenopathy.  Neurological: He is alert and oriented to  person, place, and time.          Assessment & Plan:  #1 Hypogonadism. Symptomatically improved with replacement. Recess total testosterone level in one month #2 type 2 diabetes with history of poor control. Reassess A1c at follow up in one month

## 2010-05-21 ENCOUNTER — Ambulatory Visit: Payer: Self-pay | Admitting: Internal Medicine

## 2010-05-22 NOTE — H&P (Signed)
Gary Yates, Gary Yates                 ACCOUNT NO.:  [id number]   MEDICAL RECORD NO.:  [id number]          PATIENT TYPE:  INP   LOCATION:  4729                         FACILITY:  [facility]   PHYSICIAN:  Denice Bors. Stanford Breed, MD, FACCDATE OF BIRTH:  1933/11/07   DATE OF ADMISSION:  05/13/2006  DATE OF DISCHARGE:                              HISTORY & PHYSICAL   CHIEF COMPLAINT:  Chest pain.   HISTORY OF PRESENT ILLNESS:  This is a very pleasant 77 year old male  patient followed by Dr. Domenic Polite with a history of coronary disease  status post drug-eluting stent placement to the RCA in 2004 with recent  low-risk Myoview study who presents in referral from Urgent Care tonight  with chest discomfort.  It started around 5 o'clock this evening.  He  had just come in from working outside.  He had been planting flowers.  He has had significant dyspnea on exertion over the last several months  and has been seeing pulmonology.  He noted shortness of breath while he  was working.  After he came into the house he had some diarrhea and  thereafter developed some chest discomfort.  His chest discomfort is two  fold.  He does have some substernal chest heaviness.  This has been  fairly constant.  He had some left-sided sharp chest pain that comes and  goes.  He denies any radiation.  He he does think his shortness of  breath is a little bit worse today.  He notes associated nausea and  lightheadedness but no diaphoresis or syncope.  He eventually went to  the Urgent Care and was referred here for further evaluation and  treatment.  Initial cardiac markers and EKG is negative.   PAST MEDICAL HISTORY:  1. Coronary artery disease.      a.     Status post Taxus drug-eluting stent placement to the       proximal mid RCA March 2004.      b.     Residual coronary disease by catheterization March 2004       first diagonal superior branch 50% ostial, second diagonal ostial       40%, OM 3 25% proximal.      c.      Myoview study December 2007 revealing inferior scar with       mild peri-infarct ischemia with an EF of 63 - low risk study -       medical therapy.  2. Diabetes mellitus.      a.     Diabetic neuropathy.  3. History of SVT.  4. Hypertension.  5. Hyperlipidemia.  6. Cerebrovascular disease with 0 to 39% bilateral ICA stenosis by      carotid Doppler's December 2007.  7. COPD with asthmatic bronchitis.      a.     Recent evaluation by pulmonology - followed by Dr. Joya Gaskins.  8. History of pneumonia December 2007.  9. Nephrolithiasis.  10.History of multiple surgeries including inguinal herniorrhaphy,      appendectomy, cholecystectomy, two back surgeries, two knees      surgeries,  two shoulder surgeries and carpal tunnel surgery.   MEDICATIONS:  Aspirin 81 mg daily, Benicar 20 mg a day, Cartia XT 240 mg  a day, Duet-act 30/4 mg daily, Lyrica 75 mg b.i.d., metformin 5 mg  b.i.d., multivitamin daily, pravastatin 20 mg daily, Protonix 40 mg  daily.   ALLERGIES:  CODEINE and PENICILLIN.   SOCIAL HISTORY:  He lives in [place].  He is a retired Customer service manager.  He is  an ex-smoker.  He denies alcohol abuse.  He is married with three  children.   FAMILY HISTORY:  Significant for Alzheimer's disease in his mother who  is deceased.  Father died in his 62s with congestive heart failure.  Sister has coronary disease and is status post CABG.   REVIEW OF SYSTEMS:  Please see HPI.  He did develop a headache today.  This is frontal.  There is no associated changes in his vision.  He  usually has headaches and this is the usual area.  There is no  difference in his headaches today.  Denies fevers, chills.  He does have  cough.  This is nonproductive.  There is no hemoptysis.  He denies  orthopnea or paroxysmal nocturnal dyspnea.  He denies bright red blood  per rectum or melena.  Denies any dysuria or hematuria.  The rest of the  review of systems are negative.   PHYSICAL EXAMINATION:  GENERAL:  A  well-nourished, well-developed.  VITAL SIGNS:  Blood pressure 145/80, pulse 86, respirations 20,  temperature 97.6.  HEAD:  Normocephalic and atraumatic.  Eyes: PERRLA.  Sclerae clear.  NECK:  Without JVD.  LYMPH:  Without lymphadenopathy.  ENDOCRINE:  Without thyromegaly.  Carotids with faint bilateral bruits.  CARDIAC:  Normal S1 and S2.  Regular rate and rhythm without murmurs,  rubs or gallops.  LUNGS:  Clear to auscultation bilaterally with wheezing, rhonchi or  rales.  ABDOMEN:  Soft, nontender, with normoactive bowel sounds.  No  organomegaly.  EXTREMITIES:  Without clubbing, cyanosis or edema.  MUSCULOSKELETAL:  Without joint deformity.  NEUROLOGIC:  He is alert and oriented x3.  Cranial nerves II-XII grossly  intact.  VASCULAR:.  Dorsalis pedis and posterior tibialis pulses are 2+  bilaterally.   Chest x-ray is pending.  Electrocardiogram reveals sinus rhythm with a  heart rate of 97, left axis deviation, right bundle branch block.  No  acute changes.  PVCs noted.   LABORATORY DATA:  White count 8600, hemoglobin 14.3, hematocrit 42.1,  platelet count 231,000.  Sodium 139, potassium 3.3, BUN 7, creatinine  0.97, glucose 115. CK-MB 2.8, troponin I 0.02, calcium 9.8, INR 1.0.   IMPRESSION:  1. Chest pain.  2. Coronary artery disease status post Taxus drug-eluting stent      placement to RCA 2004.      a.     Recent low-risk Myoview scan December 2007.      b.     Preserved LV function with an EF of 630% by recent nuclear       study.  3. COPD/asthmatic bronchitis.      a.     Recent history of significant dyspnea on exertion followed       by pulmonology (Dr. Joya Gaskins).  4. Diabetes mellitus type 2.      a.     Diabetic neuropathy.  5. Hyperlipidemia.  6. Hypertension.  7. Status post multiple surgeries.  8. Cerebrovascular accident with 0 to 39 percent bilateral ICA      stenosis by  carotid Doppler's, December 2007. 9. Hypokalemia.   PLAN:  The patient presents with  symptoms of chest pain with typical and  atypical features.  He had a recent low-risk Myoview scan.  His dyspnea  on exertion has been quite significant but has been followed by  pulmonology and it is felt he has COPD with asthmatic bronchitis.  Will  plan to admit the patient to telemetry bed.  Will check serial cardiac  markers.  Will continue him on his home medications including aspirin  and statin.  Will hold off on adding  beta-blocker at this time given his history of asthma.  Continue with  his Diltiazem.  Will add nitroglycerin drip as well as heparin.  Will  consider cardiac catheterization to further evaluate his symptoms.  Will  have to reassess the patient in the morning after the above testing is  completed and make a final decision regarding further evaluation.      Richardson Dopp, PA-C      Denice Bors. Stanford Breed, MD, [facility]  Electronically Signed    SW/MEDQ  D:  05/13/2006  T:  05/14/2006  Job:  [id number]

## 2010-05-22 NOTE — Assessment & Plan Note (Signed)
[facility] OFFICE NOTE   TAJUAN, BREITBACH                          MRN:          [id number]  DATE:09/22/2007                            DOB:          1933/03/30    PRIMARY CARE PHYSICIAN:  Ricard Dillon, MD   REASON FOR VISIT:  Cardiac followup.   HISTORY OF PRESENT ILLNESS:  Mr. Michonski is a pleasant 77 year old Caucasian  male with a past medical history significant for hypertension,  hyperlipidemia, morbid obesity, diabetes mellitus, coronary artery  disease, and obstructive lung disease who returned to our office today  for routine cardiac followup.  The patient was most recently seen in my  office on August 13, 2007, and at that time had complained of worsening  dyspnea on exertion.  He had been seen by Dr. Rozann Lesches on July 06, 2007, with the similar complaints and had a myocardial perfusion  stress study performed with adenosine that showed normal contractility  of the left ventricle and no evidence of myocardial ischemia.  An  echocardiogram at that time also showed normal left ventricular systolic  function with an ejection fraction of 60%.  His Doppler parameters were  consistent with abnormal left ventricular relaxation.  His aortic valve  thickness was mildly increased and the aortic valve was mildly  calcified.  Otherwise, there was just mild mitral valvular regurgitation  and mild dilation of the left atrium and right atrium.   During his last visit on August 13, 2007, in my office I felt that his  dyspnea was most likely multifactorial in etiology.  The patient is  morbidly obese and severely deconditioned, has at least moderate COPD  and also has significant diastolic dysfunction.  At that time, I  recommended that he begin taking Lasix 20 mg every other day.  I also  recommended that he follow up with Dr. Joya Gaskins of Pulmonology in regards  to his COPD and also become enrolled again in  cardiopulmonary  rehabilitation.  The patient comes in today and states that he feels  much better.  He still has a baseline mild shortness of breath, but has  much improved.  He saw Dr. Joya Gaskins and was started on Spiriva.  He has  been going regularly to cardiopulmonary rehabilitation and has been very  happy with that program.  He has also been taking his Lasix every other  day and feels like this has helped him as well.  The patient points out  today that he has lost several pounds since his last visit in this  office.  He is accompanied by his wife today.  He states that he has  been trying to do better in regards to exercise and also in his diet.  He has no complaints today of chest pain, palpitations, dizziness, near  syncope, syncope, orthopnea, PND, or lower extremity edema.  As noted  above, he does still have some shortness of breath with exertion.  The  patient was able to do yard work this week with minimal limitation.   ALLERGIES:  PENICILLIN  and CODEINE.   CURRENT MEDICATIONS:  1. Aspirin 81 mg once daily.  2. Multivitamin once daily.  3. Bystolic 5 mg at night.  4. Spironolactone 25 mg once daily.  5. Lasix 20 mg every other day.  6. Calcium once daily.  7. Protonix 40 mg once daily.  8. Avandia 4 mg once daily.  9. Glimepiride 4 mg once daily.  10.Gabapentin 300 mg 2 tablets twice daily.  11.Spiriva inhaler as directed.   REVIEW OF SYSTEMS:  As stated in the history of present illness and is  otherwise negative.   PHYSICAL EXAMINATION:  GENERAL:  He is a pleasant elderly morbidly obese  Caucasian male, in no acute distress.  VITAL SIGNS:  Weight 291 pounds today, blood pressure 125/78, pulse 75  and regular, respirations 16 and nonlabored.  NECK:  Unable to visualize any JVD.  No carotid bruits noted.  SKIN:  Warm and dry.  OROPHARYNX:  Mucous membranes are moist.  LUNGS:  Clear to auscultation bilaterally without wheezes, rhonchi, or  crackles noted.   CARDIOVASCULAR:  Regular rate and rhythm without murmurs, gallops, or  rubs noted.  ABDOMEN:  Obese.  Bowel sounds present.  EXTREMITIES:  No evidence of edema.   DIAGNOSTIC STUDIES:  There are no diagnostic studies for review today.   ASSESSMENT AND PLAN:  This is a pleasant 77 year old morbidly obese  Caucasian male with past medical history significant for hypertension,  hyperlipidemia, diabetes mellitus, known coronary artery disease status  post stent placement in 2004 with subsequent patency of stent  demonstrated by catheterization in May 2008; who also has a history of  diastolic dysfunction, moderate obstructive pulmonary disease, and  severe physical deconditioning and presents today for routine cardiac  followup.  The patient seems to be doing much better since he was  started on an inhaler by Dr. Joya Gaskins.  I think that he has made major  strides recently with his enrollment back into cardiopulmonary  rehabilitation and his efforts to lose some weight.  He seems to be  doing well with the Lasix dosed on every other day basis.  His blood  pressure is well controlled today.  I am also satisfied with his current  heart rate.  He is to follow up with Dr. Joya Gaskins in approximately 4 weeks  for his chronic obstructive pulmonary disease.  From a cardiovascular  standpoint, I will make no changes in his medications.  The patient will  return to see me in this office in approximately 6 months.     Lauree Chandler, MD  Electronically Signed    CM/MedQ  DD: 09/22/2007  DT: 09/23/2007  Job #: [id number]   cc:   Ricard Dillon, MD  Burnett Harry Joya Gaskins, MD, FCCP

## 2010-05-22 NOTE — Discharge Summary (Signed)
Gary Yates, Gary Yates                 ACCOUNT NO.:  [id number]   MEDICAL RECORD NO.:  [id number]          PATIENT TYPE:  INP   LOCATION:  [id number]                         FACILITY:  [facility]   PHYSICIAN:  Satira Sark, MD DATE OF BIRTH:  [birth year]   DATE OF ADMISSION:  05/13/2006  DATE OF DISCHARGE:  05/15/2006                         DISCHARGE SUMMARY - REFERRING   DISCHARGE DIAGNOSES:  1. Dyspnea on exertion and chest discomfort in the setting of      diastolic dysfunction and known asthmatic bronchits/COPD.  2. Coronary artery disease.  3. Hypertension.  4. Hyperlipidemia.  5. Chronic obstructive pulmonary disease.   PROCEDURES PERFORMED:  Cardiac catheterization May 14, 2006 Dr. Eustace Quail.   SUMMARY OF HISTORY:  Mr. Ivey is a 77 year old male who was referred from  urgent care secondary to chest discomfort that had begun around 5  o'clock on the evening of admission after he had come in from working  outside gardening.  He has been seeing Pulmonary for significant dyspnea  on exertion over the preceding month.  After working in the garden he  noticed increased shortness of breath and diarrhea followed by chest  discomfort which he described as a heaviness, constant as well as some  left-sided sharp chest pain that was intermittent.  Initial EKG and  markers were unremarkable.   PAST MEDICAL HISTORY:  1. Notable for coronary artery disease with drug-eluting stent to the      proximal mid RCA in March 2004.  Residual disease by      catheterization.  Negative Myoview in 2007 which showed inferior      scar, mild peri-infarct ischemia felt to be a low risk study.  EF      63%.  2. Diabetes with neuropathy.  3. SVT.  4. Hypertension.  5. Hyperlipidemia.  6. Cerebrovascular disease with bilateral 0-39% ICA by Dopplers in      December2007.  7. COPD with asthmatic bronchitis.  8. Pneumonia December 2007.  9. Nephrolithiasis.   ALLERGIES:  1. CODEINE.  2. PENICILLIN.   LABORATORY:  Chest x-ray on May 14, 2006 showed bibasilar atelectasis.  Admission weight was 286 pounds.  H and H was 14.3 and 42.1, normal  indices, platelets 231, WBCs 8.6.  Prior to discharge H and H was 12.7  and 37.4, normal indices, platelet 230, WBCs 8.6.  ABG on May 14, 2006  showed a pH of 7.35, pCO2 39.3, pO2 of 80, bicarb of 21.5, 70%  saturation.  Admission PTT was 29, PT 12.9.  Sodium 139, potassium 3.3,  BUN 7, creatinine 0.97, glucose 115.  LFTs were within normal limits  except for his AST was elevated at 46.  Prior to discharge sodium was  137, potassium 3.5, BUN 9, creatinine 1.16, glucose 153.  Hemoglobin A1c  on the 24th was 7.9.  CK MBs and troponins were within normal limits.  BNP was less than 30.  Total cholesterol was 165, triglycerides 188, LDL  37, LDL 90.  EKG showed normal sinus rhythm, PACs, PVCs, left axis  deviation, right bundle branch block, left anterior fascicular  block.   HOSPITAL COURSE:  Mr. Vollbrecht was admitted to [facility].  He was  placed on his home medications.  Cardiac catheterization was performed  on May 7,2008.  This revealed a 50% proximal LAD, 50-70% proximal LAD  less than 10% and the RCA stent.  EF was 55%.  Catheterization was  performed by Dr. Olevia Perches.  He did have elevated pulmonary pressures  compatible with diastolic heart failure and also likely his chronic  pulmonary disease.  Diuresis was recommended.  Post catheterization  sheath removal and bedrest.  The patient's site was intact.  By May 15, 2006 he was ambulating without difficulty.  He had diuresed well.  His  potassium had been supplemented.  Dr. Domenic Polite felt that the patient  could be discharged home with follow-up.   DISPOSITION:  The patient is discharged home.  Activities and wound care  are dictated per supplemental discharge sheet.  He is asked to maintain  low-sodium, heart-healthy ADA diet.  He received new prescriptions for  Lasix 40 mg daily, nitroglycerin  0.4 as needed and potassium 20 mEq  daily.  He was asked to continue aspirin 81 mg daily, Benicar 20 mg  daily, Cardia XT 240 mg daily, Duetact 30/4 daily, Lyrica 75 b.i.d.  He  was asked to resume metformin 500 mg b.i.d. on the morning of May 16, 2006, multivitamin daily, pravastatin 20 mg daily, Protonix 40 mg daily.  He was asked to bring all medications to all appointments.  At the time  of follow-up with Dr. Domenic Polite on June 09, 2006 at 3:15 he was asked to  come to the office early to have some blood work done in regards to a B-  MET to follow up on potassium, BUN and creatinine.  He was initially  supposed to have an appointment with Dr. Joya Gaskins yesterday, but as he was  in the hospital he did not make this appointment.  Thus this has been  rescheduled for May 29, 2006 at 3:50 p.m.  He will follow up with Dr.  Arnoldo Morale as needed.      Sharyl Nimrod, PA-C      Satira Sark, MD  Electronically Signed    EW/MEDQ  D:  05/15/2006  T:  05/15/2006  Job:  [id number]   cc:   Burnett Harry. Joya Gaskins, MD, [facility]  Satira Sark, MD  Dr. Arnoldo Morale

## 2010-05-22 NOTE — Assessment & Plan Note (Signed)
[place]                                 ON-CALL NOTE   NAME:LEELateef, Talley                        MRN:          [id number]  DATE:05/17/2006                            DOB:          11/28/1933    TELEPHONE NUMBER:  [phone]   He called regarding his medications. Gary Yates states that he was  hospitalized this week via some chest pain. He underwent a cardiac  catheterization which revealed no obstructive disease. He did have fluid  in his lungs as he describes it which sounds like heart failure and he  was given Lasix.  He lost 3 liters of fluid while he was hospitalized  and went home and continued on Lasix. He has taken Lasix at home on  Thursday, Friday, and also today. He reports that with the Lasix he has  been taking, he has developed itchiness all over from the top of his  head across his body. He denies any shortness of breath, throat  swelling, or tongue swelling related to this medication. Lasix and  potassium are the only new medications added to his regimen. He has  taken Benadryl this evening, but called regarding continuation or  discontinuation of the drug. I explained to Mr. Verhoeven that this sounds  like he is having an allergic reaction to the medication and to  discontinue its use. I have instructed him to take Benadryl which he can  take up to 4 times a day as needed. I have also instructed him to not  drive or operate any machinery while he is on Benadryl as this does  cause drowsiness. He will call the office on Monday morning for an  appointment to have his medication changed. He has been instructed not  to take any further Lasix at this point. If his symptoms do not improve  with discontinuation of the drug, then he is also supposed to call us  back to contact us and let us know this. I've also instructed him on a  low salt diet since he ahs had heart failure.     Dyanne Carrel, MD  Electronically Signed    CGF/MedQ   DD: 05/17/2006  DT: 05/18/2006  Job #: [id number]

## 2010-05-22 NOTE — Assessment & Plan Note (Signed)
[facility]                            CARDIOLOGY OFFICE NOTE   NICANDRO, OUTCALT                        MRN:          [id number]  DATE:07/16/2006                            DOB:          April 24, 1933    PRIMARY CARE PHYSICIAN:  Ricard Dillon, M.D.   REASON FOR VISIT:  Cardiac followup.   HISTORY OF PRESENT ILLNESS:  Mr. Fare states he has actually been doing  quite well since his last visit.  There has been some question as to  whether he had allergic reactions to either Lasix or Aldactone with  description of intermittent itching, although he seems now, in  retrospect, to feel like this may be related to seasonal allergies that  happened to be ongoing at the time of his medication use.  In any event,  with medication adjustments, he is feeling well and, at this point, is  not taking any diuretic.  He does have intermittent lower extremity  edema.  He has not had any problems with exertional chest pain or  limiting dyspnea at this point.   ALLERGIES:  PENICILLIN, CODEINE.   PRESENT MEDICATIONS:  1. Metformin 500 mg p.o. b.i.d.  2. Aspirin 81 mg p.o. daily.  3. Multivitamin daily.  4. Pravastatin 20 mg p.o. daily.  5. Lyrica 75 mg p.o. b.i.d.  6. Duetact 30/4 mg p.o. daily.  7. Protonix 40 mg p.o. daily.  8. Bystolic 5 mg p.o. q.h.s.  9. Foradil b.i.d.  10.QVAR 80 micrograms p.o. b.i.d.   REVIEW OF SYSTEMS:  As described in History of Present Illness.   PHYSICAL EXAMINATION:  Blood pressure 139/83, heart rate 65, weight 279  pounds.  Patient is comfortable, in no acute distress.  NECK:  Examination of the neck reveals no elevated jugular venous  pressure, no bruits, no thyromegaly noted.  LUNGS:  Clear, no labored breathing or rales noted.  CARDIAC EXAM:  Reveals a regular rate and rhythm, no loud murmur or  gallop.  EXTREMITIES:  Show trace edema below the knees bilaterally.   IMPRESSION/RECOMMENDATIONS:  1. Coronary artery disease  and diastolic dysfunction, as outlined      previously.  Patient is relatively stable symptomatically and we      will continue his present medications.  I did mention that he could      re-attempt p.r.n. use of Lasix with potassium supplementation, if      he develops increasing lower extremity edema or dyspnea and keep an      eye on whether this tends to exacerbate any of his prior pruritus.      If not, we may be able to continue use of Lasix on a long-term      basis.  Otherwise, I will plan to see him back over the next six      months for routine followup.  2. Otherwise, continue to see Dr. Arnoldo Morale.     Satira Sark, MD  Electronically Signed    SGM/MedQ  DD: 07/16/2006  DT: 07/16/2006  Job #: [id number]   cc:   Ricard Dillon,  MD

## 2010-05-22 NOTE — Assessment & Plan Note (Signed)
[place] OFFICE NOTE   MOHAB, MEDNICK                          MRN:          [id number]  DATE:08/13/2007                            DOB:          [birth year]    PRIMARY CARE PHYSICIAN:  Ricard Dillon, MD   REASON FOR VISIT:  Cardiac followup.   HISTORY OF PRESENT ILLNESS:  Mr. Webb is a pleasant 77 year old Caucasian  male with a past medical history significant for hypertension,  hyperlipidemia, morbid obesity, diabetes mellitus, coronary artery  disease, and obstructive lung disease who returns to our office today  for further evaluation of dyspnea on exertion.  The patient was most  recently seen in our office on July 06, 2007, by Dr. Rozann Lesches.  At that time, he had complained of worsening dyspnea on exertion as well  as some occasional chest discomfort with exertion.  Following that  visit, an adenosine myocardial perfusion study was performed on July 15, 2007, which showed normal contractility and thickening in all areas of  the myocardium.  The overall left ventricular function was normal.  There was no significant ST-segment change with adenosine infusion and  there were no changes suggestive of ischemia on his perfusion images.  An echocardiogram was also performed which showed overall normal left  ventricular systolic function with an ejection fraction of 60%.  There  were no regional wall motion abnormalities noted.  Doppler parameters  were consistent with abnormal left ventricular relaxation.  His aortic  valve thickness has mildly increased and the aortic valve was mildly  calcified.  There was mild mitral annular calcification with mild mitral  valvular regurgitation.  There was mild dilation of the left atrium and  right atrium.   The patient returns today for review of these studies.  He states that  he had been doing well earlier this year completing pulmonary rehab and  increasing  his exercise tolerance as well as losing 15 pounds.  Around  that time, his Bystolic was changed to Coreg and he seemed to  decompensate then.  He was restarted back on his Bystolic and the Coreg  was discontinued.  He, during that time, regained the weight he had  lost.  He seemed to become much more deconditioned during that time and  has since reported much worsened dyspnea with exertion.  Of note, he has  got pulmonary function testing within the last year which showed  moderate obstructive disease.  He was treated for sometime with an  inhaler which seemed to improve his symptoms, however, he has not been  using the inhaler lately.  He denies any chest pain with exercise.  He  underwent heart catheterization in May 2008, which showed a patent stent  in the right coronary artery with nonobstructive coronary  atherosclerosis in the other distributions and a normal ejection  fraction at that time.  He has no other complaints at the current time.   ALLERGIES:  PENICILLIN and CODEINE.   CURRENT MEDICATIONS:  1. Aspirin 81 mg once daily.  2. Multivitamin once daily.  3. Bystolic 5 mg at night.  4. Spironolactone 25 mg once daily.  5. Calcium once daily.  6. Protonix 40 mg once daily.  7. Avandia 8 mg with 1/2 tablet taken once daily.  8. Benadryl p.r.n.  9. Glimepiride 4 mg once daily.  10.Gabapentin 300 mg 2 tablets twice daily.   REVIEW OF SYSTEMS:  As per the history of present illness.  He denies  chest pain, palpitations, dizziness, near syncope, syncope, or lower  extremity edema.   PHYSICAL EXAMINATION:  GENERAL:  He is a pleasant, obese Caucasian male  in no acute distress.  VITAL SIGNS:  Blood pressure 116/74, pulse 67 and regular.  Respirations  12 and unlabored.  Weight 294 pounds.  NECK:  No obvious JVD.  No carotid bruits.  SKIN:  Warm and dry.  LUNGS:  Clear to auscultation bilaterally without wheezing, rhonchi, or  crackles noted.  CARDIOVASCULAR:  Regular rate  and rhythm without murmurs, gallops, or  rubs noted.  ABDOMEN:  Obese.  Bowel sounds present.  EXTREMITIES:  No evidence of edema.   DIAGNOSTIC STUDIES:  1. Echocardiogram as outlined above.  2. Myocardial perfusion stress study as outlined above.  3. A 12-lead electrocardiogram which shows normal sinus rhythm with a      ventricular rate of 67 beats per minute, right bundle branch block      and left anterior fascicular block.   ASSESSMENT AND PLAN:  This is a pleasant 77 year old morbidly obese  Caucasian male with a past medical history significant for hypertension,  hyperlipidemia, diabetes mellitus, known coronary artery disease status  post stent placement in 2004 with subsequent patency of stent  demonstrated by catheter in May 2008, who also has a history of  diastolic dysfunction, moderate obstructive pulmonary disease, and  severe physical deconditioning.  His chief complaint today is that of  dyspnea with exertion which I feel is multifactorial.  He is clearly  limited by obesity and sever deconditioning, however, he does have  significant diastolic dysfunction.  At this time, his blood pressure is  well controlled.  He continues to take diuretics with no significant  volume overload.  I do believe that a subsequent assessment of his lungs  should be made given the pulmonary function test that showed moderate  obstructive disease.  This patient may benefit from an inhaler in the  future.  I am going to make a recommendation that he follow up with his  pulmonary specialist Dr. Joya Gaskins.  I will also add Lasix 20 mg to be  taken every other day.  The patient should continue in cardiopulmonary  rehabilitation.  We will  check the basic metabolic profile in 2 weeks, since we are starting him  on a small dose of Lasix.  We will see him on return to our office in 6  weeks and see how he is doing at that time.     Lauree Chandler, MD  Electronically Signed    CM/MedQ   DD: 08/13/2007  DT: 08/14/2007  Job #: [id number]   cc:   Fay Records, MD, [facility]  Ricard Dillon, MD

## 2010-05-22 NOTE — Assessment & Plan Note (Signed)
[place]                             PULMONARY OFFICE NOTE   NAME:Gary Yates, Gary Yates                        MRN:          [id number]  DATE:05/30/2006                            DOB:          Dec 22, 1933    Mr. Domin returns today in followup.  Still complaining of some degree of  dyspnea, but mainly no energy.  He did have a sleep study done  previously, which did not show significant sleep apnea, but did show  nocturnal desaturation.   The patient maintains:  1. Cartia 240 mg daily.  2. Benicar 20 mg b.i.d.  3. Metformin 500 mg b.i.d.  4. Pravastatin daily.  5. Aspirin 81 mg daily.  6. Metanx b.i.d.  7. Spiriva daily.  8. Qvar 2 sprays b.i.d. 80 mcg strength.  9. Protonix 40 mg daily.  10.Aldactone 25 mg daily.   EXAM:  Temperature 97.8, blood pressure 130/66, pulse 99, saturation 93%  on room air.  CHEST:  Showed distant breath sounds with prolonged expiratory phase.  No wheeze or rhonchi noted.  CARDIAC:  Regular rate and rhythm without S3.  Normal S1, S2.  ABDOMEN:  Soft, nontender.  EXTREMITIES:  No edema or clubbing.  SKIN:  Clear.   LABORATORY DATA:  Spirometry shows FEV1 of 79% of predicted, FEVC of 94%  of predicted.  Total lung capacity 104% of predicted, diffusion capacity  74% of predicted.   IMPRESSION:  Persistent moderate obstructive lung disease, asthmatic  bronchitic components.  Nocturnal desaturation without significant sleep  apnea.   PLAN:  Maintain inhaled medicines as currently dosed, but obtain for  this patient a sleep oxymetry to see if nocturnal oxygen therapy would  be warranted.  Return in followup in 6 week.     Burnett Harry Joya Gaskins, MD, [facility]  Electronically Signed    PEW/MedQ  DD: 05/30/2006  DT: 05/30/2006  Job #: [id number]

## 2010-05-22 NOTE — Assessment & Plan Note (Signed)
Gary Yates                             PULMONARY OFFICE NOTE   NAME:LEEHavard, Kamel                        MRN:          [id number]  DATE:10/01/2006                            DOB:          08-31-1933    Mr. Barrone returns in follow-up, is a pleasant 77 year old white male,  history of chronic obstructive lung disease, reflux disease, morbid  obesity, diastolic congestive heart failure.  The patient states he  still fatigues very easily.  There has been no real difference since  being on the Foradil/Qvar for the last 5 months.  He is on now Aldactone  25 mg daily and off systemic diuretic of Lasix.  He is on the Bystolic  now 5 mg h.s.  This patient has no real cough at this time.  He states  the Carollee Herter has been of no benefit.  He did have overnight sleep  oximetry report done showing no evidence of nocturnal desaturation.  He  had previously had a sleep study, which showed no evidence of  obstructive sleep apnea.   On exam, temperature 97, blood pressure 150/88, pulse 72, saturation 95%  in room air.  CHEST:  Distant breath sounds without evidence of wheeze or rhonchi.  CARDIAC:  A regular rate and rhythm without S3, normal S1-S2.  ABDOMEN:  Soft, nontender.  EXTREMITIES:  No edema or clubbing.  SKIN:  Clear.   IMPRESSION:  Diastolic congestive heart failure, stable at this time.   Plan for this patient is to maintain off Foradil and Qvar at this time.  He was given instructions as to a plan of simple weight loss.  He will  maintain spironolactone daily.  No oxygen therapy is indicated.  We will  see this patient back in 5 months' time.     Burnett Harry Joya Gaskins, MD, [facility]  Electronically Signed    PEW/MedQ  DD: 10/01/2006  DT: 10/02/2006  Job #: [id number]   cc:   Ricard Dillon, MD

## 2010-05-22 NOTE — Assessment & Plan Note (Signed)
[place] OFFICE NOTE   KESTER, GALKA                          MRN:          [id number]  DATE:07/06/2007                            DOB:          June 11, 1933    PRIMARY CARE PHYSICIAN:  Ricard Dillon, MD   REASON FOR VISIT:  Cardiac followup.   HISTORY OF PRESENT ILLNESS:  I saw Gary Yates back in January.  He is  referred back to the office with a several-week history of progressive  dyspnea on exertion.  He has had a sensation of irregular heartbeats and  documentation of premature ventricular complexes based on strips  provided through the cardiopulmonary rehab.  This is confirmed by a  follow up electrocardiogram today.  He has been more fatigued and  actually admits to left-sided chest pain that occurred intermittently  with activity over the last few weeks as well.  I reviewed his cardiac  history including a cardiac catheterization from May of last year  demonstrating a patent right coronary artery stent, otherwise,  nonobstructive coronary atherosclerosis and ejection fraction of 55%.  He has known diastolic dysfunction.  Has otherwise done well on medical  therapy.  I do note that he was on Bystolic which was changed  temporarily to carvedilol for a few weeks.  He seems to indicate  worsening in his symptoms related to this.  He has only recently started  back on the Bystolic.   ALLERGIES:  PENICILLIN and CODEINE.   OTHER MEDICATIONS:  1. Metformin 500 mg p.o. b.i.d.  2. Aspirin 81 mg p.o. daily.  3. Multivitamin 1 p.o. daily.  4. Pravastatin 40 mg p.o. daily.  5. Lyrica 75 mg p.o. b.i.d.  6. Protonix 40 p.o. daily.  7. Bystolic 5 mg p.o. nightly.  8. Spironolactone 25 mg p.o. daily.  9. Duetact 30/4 mg p.o. daily.  10.Calcium with Vitamin D.   REVIEW OF SYSTEMS:  As per history of present illness.  He has had no  dizziness or syncope.  Otherwise, negative.   PHYSICAL EXAMINATION:  VITAL  SIGNS:  Blood pressure is 123/70, heart  rate is 66, and weight is 293 pounds which is stable.  GENERAL:  This is a morbidly obese man in no acute distress.  HEENT:  Conjunctiva is normal.  Oropharynx is clear.  NECK:  Supple.  No elevated jugular venous pressure.  No audible bruits.  No thyromegaly is noted.  LUNGS:  Generally clear.  Diminished breath sounds, but nonlabored  breathing.  No wheezing.  CARDIAC:  Regular rate and rhythm.  Occasional ectopic beat.  No S3  gallop or pericardial rub.  ABDOMEN:  Obese, nontender.  EXTREMITIES:  Exhibit no frank pitting edema.  Distal pulses are 2+.  SKIN:  Warm and dry.  MUSCULOSKELETAL:  No kyphosis noted.  NEUROPSYCHIATRY:  The patient is alert and oriented x3.  Affect is  appropriate.   IMPRESSION AND RECOMMENDATIONS:  Progressive dyspnea on exertion with  intermittent chest pain in the setting of diastolic heart failure with  preserved systolic function and known coronary artery  disease status  post previous stent placement of the right coronary artery, found to be  patent at catheterization in May of last year.  I recommended that he  continue on the Bystolic and we will arrange a followup 2-D  echocardiogram as well as an adenosine Myoview with subsequent followup  over the next few weeks.  Further plan is to follow.     Satira Sark, MD  Electronically Signed    SGM/MedQ  DD: 07/06/2007  DT: 07/07/2007  Job #: [id number]   cc:   Ricard Dillon, MD

## 2010-05-22 NOTE — Assessment & Plan Note (Signed)
[place]                             PULMONARY OFFICE NOTE   NAME:LEEDharmender, Mcphee                        MRN:          [id number]  DATE:07/08/2006                            DOB:          Mar 07, 1933    Mr. Drobnick is a 77 year old white male seen today in return followup of  underlying chronic obstructive lung disease, asthmatic bronchitis,  obesity, reflux disease, diastolic dysfunction with heart failure,  hyperlipidemia, hypertension.  His cough is better.  His overall level  of fatigue is better.  He did stop Qvar, Spiriva, and Foradil for the  past 5 days on his own.  He does not see much difference either on or  off these medications.   His other medication profile has changed.  He is now on metformin 500 mg  twice daily.  1. Aspirin 81 mg daily.  2. Pravastatin 20 mg daily.  3. Lyrica 75 mg twice daily.  4. Duetact 30/4 daily.  5. Protonix 40 mg daily.  6. He is off spironolactone but now on Bystolic 5 mg daily.  7. He is supposed to be on Qvar 2 sprays b.i.d. 80 mcg strength,      Spiriva daily, and Foradil 1 spray b.i.d., but he is not on these      at this time.   PHYSICAL EXAMINATION:  VITAL SIGNS: Temperature 97, blood pressure  136/78, pulse 74, saturation 96% on room air.  CHEST: Clear today without evidence of wheeze, rale, or rhonchi.  CARDIAC:  Exam showed a regular rate and rhythm without S3, normal S1  and S2.  ABDOMEN: Soft, nontender.  EXTREMITIES:  No edema, clubbing, or venous disease.  SKIN:  Clear.   IMPRESSION:  Chronic obstructive lung disease and asthmatic bronchitis  with stable airway function.   PLAN:  For patient to resume Qvar 2 sprays daily, discontinue Spiriva,  and maintain Foradil 1 spray b.i.d. and will see the patient back in  return followup in 2 months.     Burnett Harry Joya Gaskins, MD, [facility]  Electronically Signed    PEW/MedQ  DD: 07/08/2006  DT: 07/09/2006  Job #: [id number]   cc:   Ricard Dillon, MD  Satira Sark, MD

## 2010-05-22 NOTE — Assessment & Plan Note (Signed)
[place] OFFICE NOTE   NAME:Gary Yates, Gary Yates                          MRN:          [id number]  DATE:01/14/2007                            DOB:          05/10/1933    PRIMARY CARE PHYSICIAN:  Gary Yates.   REASON FOR VISIT:  Cardiac followup.   HISTORY OF PRESENT ILLNESS:  Gary Yates comes in for a 6 month visit.  He  recently saw Gary Yates and plans to begin cardiopulmonary  rehabilitation soon.  He reports stable dyspnea on exertion, largely  NYHA class II with no frank exertional chest pain.  He has occasional  fleeting sharp pain in the left side of his chest that he thinks may  be musculoskeletal.  He had followup carotid Dopplers demonstrating only  mild bilateral plaque without any significant change from 2005.  His  electrocardiogram is also stable showing left anterior fascicular block  and right bundle branch block pattern with occasional premature  ventricular complexes.  His medicines are discussed below.  He is now  back up to 40 mg a day of his pravastatin following lab work by Dr.  Arnoldo Yates.  He continues on spironolactone and seems to be tolerating this  well.  Today we talked about diet, weight loss, and exercise, as I think  this is and the principle issue here.  He seems more motivated to at  this time.   ALLERGIES:  PENICILLIN and CODEINE.   MEDICATIONS:  1. Metformin 500 mg p.o. b.i.d.  2. Aspirin 81 mg p.o. daily.  3. Multivitamin daily.  4. Pravastatin 40 mg p.o. daily.  5. Lyrica 75 mg p.o. b.i.d.  6. Protonix 40 mg p.o. daily.  7. Bystolic 5 mg p.o. nightly.  8. Spirolactone 25 mg p.o. daily.  9. Duetact 40/4 mg p.o. daily.   REVIEW OF SYSTEMS:  As described in the history of present illness.   EXAMINATION:  Blood pressure 138/88 left arm with a large cuff, heart  rate is 73, weight 294 pounds.  A morbidly obese male acute distress.  Examination of neck reveals no elevated  was pressure no loud bruits.  Lungs are clear with diminished breath sounds.  Cardiac exam reveals a regular rate and rhythm without murmur or gallop.  ABDOMEN:  Morbidly obese.  EXTREMITIES:  Exhibit some chronic venous stasis, trace edema.   IMPRESSION/RECOMMENDATIONS:  1. Chronic diastolic dysfunction, stable.  He will continue his      present dose of spironolactone and Bystolic.  I have encouraged him      with plans to continue on to cardiopulmonary rehabilitation.  We      talked about diet, exercise and weight loss today.  He is not      describing any angina and does have known coronary artery disease.      Last cardiac catheterization in May 2008 demonstrated essentially      nonobstructive disease with patent stent site within the mid right      coronary artery, and otherwise nonobstructive left system disease.  Ejection fraction was 55% at that time.  I will plan to see him      back in 6 months.  2. Hyperlipidemia, on pravastatin.  This has been followed by Dr.      Arnoldo Yates recently.     Gary Yates  Electronically Signed    SGM/MedQ  DD: 01/14/2007  DT: 01/14/2007  Job #: [id number]   cc:   Gary Yates  Gary Yates, FCCP

## 2010-05-22 NOTE — Cardiovascular Report (Signed)
NAMERANIEL, DIGGES                 ACCOUNT NO.:  [id number]   MEDICAL RECORD NO.:  [id number]          PATIENT TYPE:  INP   LOCATION:  4729                         FACILITY:  [facility]   PHYSICIAN:  Bruce R. Olevia Perches, MD, FACCDATE OF BIRTH:  1933/07/09   DATE OF PROCEDURE:  05/14/2006  DATE OF DISCHARGE:                            CARDIAC CATHETERIZATION   HISTORY:  Mr. Pitta is 77 year old.  In 2004, he had tandem overlapping  stents placed in the mid-right coronary.  One of these was a drug-  eluting stent and one of these was a heparin controlled stent.  Over the  last several months, he has been bothered by symptoms of shortness of  breath with exertion.  These have been progressive, and he was admitted  with a diagnosis of possible ischemic equivalent.  He was set up for  evaluation and angiography today.  He also has diabetes.   PROCEDURE:  The procedure was performed via right femoral artery and  arterial sheath and 6-French preformed coronary catheters.  A front wall  arterial puncture was performed, Omnipaque contrast was used.  Right  heart catheterization performed percutaneously via right via the right  femoral vein using a mini sheath and Swan-Ganz dilution catheter.  The  patient tolerated procedure well.  The right femoral artery was closed  with Angio-Seal at the end of the procedure.  He left the laboratory in  satisfactory condition.   RESULTS:  LEFT MAIN CORONARY:  The left main coronary was free of  significant disease.   LEFT ANTERIOR DESCENDING ARTERY:  The left anterior descending artery  gave rise to two large diagonal branches and a septal perforator.  The  LAD actually trifurcated into the two diagonal branches and the LAD  proper.  None of these reached the apex, and the apex was supplied by  the right coronary artery.  There was 50% narrowing in the proximal LAD  before the first two diagonal branches.  There was 50-70% stenosis in  the proximal to mid-LAD  after the two diagonal branches.   THE CIRCUMFLEX ARTERY:  The circumflex artery gave rise to two small  marginal branches and atrial branch and a posterolateral branch.  These  vessels were free of significant disease.   THE RIGHT CORONARY ARTERY:  The right coronary artery was a moderate-  sized vessel that gave rise to a right ventricle branch, posterior  descending branch and two posterolateral branches.  This was a large  vessel that was free of major obstruction with irregularities.  There  was less than 10% narrowing at the tandem overlapping stents in the mid-  right coronary.   THE LEFT VENTRICULOGRAM:  The left ventriculogram presented in the RAO  projection showed good wall motion with no areas of hypokinesis.  The  estimated fraction was 55%.   HEMODYNAMIC DATA:  The right atrial pressure was 18 mean.  The pulmonary  artery pressure was 50/28 with a mean of 38.  Pulmonary wedge pressure  was 28 mean.  Left ventricular pressure was 162/28.  Aortic pressure was  162/93 with a mean  of 123.  Cardiac output/cardiac index was 7.2/3.0  liters/minutes/meter squared by Fick.   CONCLUSION:  1. Nonobstructive coronary disease status post prior stenting of the      right coronary with 50% narrowing of the proximal LAD, 50-70%      narrowing in the proximal to mid LAD, no significant obstruction of      circumflex artery, less than 10% narrowing at the stent site in the      mid-right coronary artery and normal LV systolic function with an      estimated fraction of 55%.  2. Markedly elevated filling pressures with pulmonary wedge pressure      of 28, a pulmonary artery pressure of 50/28 with a mean of 38 and a      right atrial pressure of 18 mean.   RECOMMENDATIONS:  The patient has nonobstructive coronary disease.  He  has markedly elevated filling pressures consistent with diastolic  dysfunction and diastolic heart failure.  Will plan treatment with IV  diuretics.  I discussed  this situation Dr. Domenic Polite and he will decide  about further evaluation in the morning.      Bruce Alfonso Patten Olevia Perches, MD, [facility]  Electronically Signed     BRB/MEDQ  D:  05/14/2006  T:  05/15/2006  Job:  [id number]   cc:   Ricard Dillon, MD  Satira Sark, MD  Cardiopulmonary

## 2010-05-22 NOTE — Assessment & Plan Note (Signed)
[facility] OFFICE NOTE   NAME:LEELani, Beavers                        MRN:          [id number]  DATE:05/20/2006                            DOB:          10-Nov-1933    PRIMARY CARE PHYSICIAN:  Benay Pillow, MD   REASON FOR VISIT:  Post hospitalization followup.   HISTORY OF PRESENT ILLNESS:  Mr. Clinkenbeard was recently admitted to the  hospital with dyspnea on exertion.  He ruled out for myocardial  infarction, and underwent a cardiac catheterization, which revealed a  50% proximal left anterior descending stenosis, 50% to 70% mid left  anterior descending stenosis, and less than 10% stenosis within the  right coronary artery stent with overall ejection fraction of 55%.  He  did have an elevated left ventricular and diastolic pressure, and a low-  dose diuretic regimen was added.  He was diuresed in the hospital, and  had improved symptoms.   Mr. Null was placed on Lasix with a potassium supplement (both new), and  tolerated this initially, although subsequently developed itching  without frank rash, and stopped both drugs.  The only other new exposure  was using his wife's soap, although this was a generally well tolerated  soap (Dove).  These symptoms have completely resolved.  He also mentions  that he has had some left leg pain beginning from his hip, and running  down the lateral aspect to his ankle.  No frank calf tenderness, and no  asymmetrical swelling.  He does have a history of previous back surgery.   His electrocardiogram today shows sinus rhythm with a right bundle  branch block pattern, which is old.   ALLERGIES:  1. PENICILLIN.  2. CODEINE.  3. POSSIBLY ALSO LASIX.   MEDICATIONS:  1. Cardia XT 240 mg p.o. daily.  2. Benicar 10 mg p.o. daily.  3. Metformin 500 mg p.o. b.i.d.  4. Aspirin 81 mg p.o. daily.  5. Multivitamin p.o. daily.  6. Pravastatin 20 mg p.o. daily.  7. Lyrica 75 mg p.o.  b.i.d.  8. Duetact 30/4 mg p.o. daily.  9. Spiriva.  10.Protonix 40 mg p.o. b.i.d.   REVIEW OF SYSTEMS:  As described in history of present illness.   EXAMINATION:  Blood pressure is 148/80.  Heart rate is 95.  Weight is  208 pounds.  Patient is comfortable, in no acute distress.  Examination of the neck reveals no elevated jugular venous pressure  without bruits.  No thyromegaly is noted.  LUNGS:  Clear without labored breathing.  CARDIAC EXAM:  Reveals a regular rate and rhythm.  No S3 or gallop.  EXTREMITIES:  Show no significant pitting edema.  No asymmetry.  No calf  tenderness.  Examination of the skin shows no rash.  NEUROPSYCHIATRIC:  Patient alert and oriented x3.   IMPRESSION AND RECOMMENDATIONS:  1. Dyspnea on exertion, likely related to an element of diastolic      dysfunction in the setting of known underlying ischemic heart      disease.  Recent cardiac catheterization demonstrated patent site  with otherwise moderate residual atherosclerosis.  He ruled out for      myocardial infarction at that time.  He was placed on Lasix with      potassium supplement, although may have had a reaction to this,      although it is not entirely clear.  We have elected to discontinue      both Lasix and potassium, and in its place, start aldactone 25 mg      p.o. daily.  He will follow up for blood work as already arranged,      and I will plan to see him back over the next month.  2. I've asked huim to follow-up with Dr. Arnoldo Morale regarding his left      leg pain.  3. Further plans to follow.     Satira Sark, MD  Electronically Signed    SGM/MedQ  DD: 05/20/2006  DT: 05/20/2006  Job #: [id number]   cc:   Ricard Dillon, MD

## 2010-05-25 NOTE — Op Note (Signed)
NAMEDERREL, LITTEKEN                 ACCOUNT NO.:  [id number]   MEDICAL RECORD NO.:  [id number]          PATIENT TYPE:  AMB   LOCATION:  [facility]                         FACILITY:  [facility]:  [place] M. Kimbrough, M.D.DATE OF BIRTH:  [birth year]   DATE OF PROCEDURE:  11/02/2004  DATE OF DISCHARGE:                                 OPERATIVE REPORT   PREOPERATIVE DIAGNOSIS:  Left mid ureteral.   POSTOPERATIVE DIAGNOSIS:  Left mid ureteral.   OPERATION:  Cystoscopy, left retrograde pyelogram, stone manipulation,  insertion of left ureteral stent.   ANESTHESIA:  General.   SURGEON:  Corky Downs, M.D.   BRIEF HISTORY:  This 77 year old patient diabetic with some mild heart  disease comes in for stone manipulation, possible ureteroscopy and removal  of a stone that started actually 3 or 4 days ago. It was a 7-8 mm proximal  left ureteral stone at that time but he has had a lot of pain and nausea and  on CT scan it looked like it was right at the pelvic brim yesterday. The  stone was kind of hidden by the ischium and comes in now for attempted stone  manipulation, possible ureteroscopy and holmium laser treatment. He had had  three previous stones in all but they have all passed spontaneously, the  last was about 15 years ago.   The patient was placed on the operating table in the dorsal lithotomy  position and after satisfactory preoperative evaluation and LMA placement,  he was given some Cipro I.V. A preoperative KUB did not show the stone  particularly well because of his large size and the fact that the stones  seem to be over the ischium. The anterior urethra was normal, did have some  mild trilobar BPH but the scope passed in the bladder. The bladder was  carefully inspected and found to be free of any mucosal lesions, 2+  trabeculated. The left orifice was normal in appearance. A six open-ended  ureteral catheter was inserted.   The left retrograde pyelogram  demonstrated the stone was more proximal than  it seemed before and on the KUB yesterday it was above the transverse  process of L4 between L3 and L4. There was moderate hydronephrosis proximal  to this. Since the stone was so high and above the pelvic brim, I decided to  go ahead and just try to manipulate the stone up a little higher which I did  with the guidewire and then passed a 6-French 26 cm length double-J ureteral  stent under fluoroscopy up to level of the kidney. The guidewire was  removed, the stent was in good position. The stone was about L3 transverse  process on the left. This was now in the  proximal ureter with the stent in  good position. The bladder was drained. He was given 15 mg of Toradol IV and  a B&O suppository and was  sent to the recovery room in good condition to be later discharged as an  outpatient. Will go ahead and plan lithotripsy on the stone next week now  that it  has been manipulated into the upper ureter where I can see it on the  KUB.      Corky Downs, M.D.  Electronically Signed     HMK/MEDQ  D:  11/02/2004  T:  11/02/2004  Job:  [id number]

## 2010-05-25 NOTE — Procedures (Signed)
NAMEKIRKLAND, GOTTESMAN                 ACCOUNT NO.:  [id number]   MEDICAL RECORD NO.:  [id number]          PATIENT TYPE:  OUT   LOCATION:  SLEEP CENTER                 FACILITY:  [facility]   PHYSICIAN:  Danton Sewer, M.D. [facility] DATE OF BIRTH:  07/19/1933   DATE OF STUDY:  01/11/2004                              NOCTURNAL POLYSOMNOGRAM   REFERRING PHYSICIAN :  Ernestine Mcmurray, MD   INDICATION FOR STUDY:  Hypersomnia with Sleep apnea.   SCORE:  The Epworth score is 13.   SLEEP ARCHITECTURE:  The patient had a total sleep time of 266 minutes with  decreased REM and never achieved slow wave sleep.  The sleep onset latency  was mildly prolonged as was REM onset.   IMPRESSION:  1.  Small numbers of obstructive events, which did not meet the respiratory      disturbance index criteria for the obstructive sleep apnea syndrome.      The patient had transient O2 desaturations with these events, as low as      88%; however, were not clinically significant.  Certainly the degree of      sleep apnea may be under-estimated by the lack of slow wave sleep and      REM; however, not significantly so.  2.  Moderate snoring noted throughout the study.  3.  Occasional premature ventricular contractions.  4.  Large numbers of leg jerks, with what appears to be very little sleep      disruption; however, the patient did complain of leg cramps during the      study and the following morning.  Clinical correlation is suggested,      since this may be the primary etiology for sleep disturbance.      KC/MEDQ  D:  01/17/2004 12:13:07  T:  01/17/2004 13:28:44  Job:  [id number]

## 2010-06-05 ENCOUNTER — Ambulatory Visit (INDEPENDENT_AMBULATORY_CARE_PROVIDER_SITE_OTHER): Payer: Medicare Other | Admitting: Internal Medicine

## 2010-06-05 ENCOUNTER — Telehealth: Payer: Self-pay | Admitting: *Deleted

## 2010-06-05 DIAGNOSIS — D519 Vitamin B12 deficiency anemia, unspecified: Secondary | ICD-10-CM

## 2010-06-05 DIAGNOSIS — E291 Testicular hypofunction: Secondary | ICD-10-CM

## 2010-06-05 DIAGNOSIS — D518 Other vitamin B12 deficiency anemias: Secondary | ICD-10-CM

## 2010-06-05 MED ORDER — TESTOSTERONE CYPIONATE 200 MG/ML IM SOLN
200.0000 mg | INTRAMUSCULAR | Status: DC
  Administered 2010-06-05: 200 mg via INTRAMUSCULAR

## 2010-06-05 NOTE — Telephone Encounter (Signed)
Pt request changing pcp to dr Elease Hashimoto- this is ok with Dr Arnoldo Morale if ok with dr Elease Hashimoto.Pt here today today for testosterone injection and thought this had been taken care of.

## 2010-06-05 NOTE — Telephone Encounter (Signed)
Pt requesting changing PCP from Dr Arnoldo Morale to Dr Elease Hashimoto.

## 2010-06-05 NOTE — Telephone Encounter (Signed)
I am okay with a change request.  He has seen Gary Yates more than myself recently

## 2010-06-08 ENCOUNTER — Telehealth: Payer: Self-pay | Admitting: *Deleted

## 2010-06-08 NOTE — Telephone Encounter (Signed)
Dr Elease Hashimoto pt

## 2010-06-08 NOTE — Telephone Encounter (Signed)
Pt can no longer afford the Kombiglyze XR, and would like to go back to Metformin.

## 2010-06-10 NOTE — Telephone Encounter (Signed)
Noted.  I suggest the following: D/C Kombiglyze and start back metformin 500 mg bid for now.  His A1Cs indicate poor diabetes control and suspect he will need insulin.  Also, his kidney function has deteriorated and he should probably not remain on metformin for long .  I recommend he follow up within one to two months to repeat A1C and BMP and discuss possible insulin at that time.

## 2010-06-11 ENCOUNTER — Telehealth: Payer: Self-pay | Admitting: *Deleted

## 2010-06-11 MED ORDER — METFORMIN HCL 500 MG PO TABS: 500.0000 mg | ORAL_TABLET | Freq: Two times a day (BID) | ORAL | Status: DC

## 2010-06-11 NOTE — Telephone Encounter (Signed)
Pt informed on home VM to call back if he has any questions about Dr Erick Blinks instrructions, will send Metformin to his pharmacy

## 2010-06-11 NOTE — Telephone Encounter (Signed)
Rx sent to local pharmacy.  

## 2010-06-12 ENCOUNTER — Other Ambulatory Visit: Payer: Self-pay | Admitting: Internal Medicine

## 2010-06-14 ENCOUNTER — Encounter: Payer: Self-pay | Admitting: Cardiovascular Disease

## 2010-06-28 ENCOUNTER — Other Ambulatory Visit: Payer: Self-pay | Admitting: Family Medicine

## 2010-07-04 ENCOUNTER — Telehealth: Payer: Self-pay | Admitting: Family Medicine

## 2010-07-04 NOTE — Telephone Encounter (Signed)
Pt informed, will route to scheduler Arbie Cookey

## 2010-07-04 NOTE — Telephone Encounter (Signed)
Yes.  Would check total testosterone level and A1C.  These need to be early morning labs.

## 2010-07-04 NOTE — Telephone Encounter (Signed)
Please advise 

## 2010-07-04 NOTE — Telephone Encounter (Signed)
Pt was prescribed Metformin and also Testosterone inj. Pt is wondering if he needs to get lab work done prior to coming in for ov? Pls advise.

## 2010-07-18 ENCOUNTER — Other Ambulatory Visit (INDEPENDENT_AMBULATORY_CARE_PROVIDER_SITE_OTHER): Payer: Medicare Other

## 2010-07-18 DIAGNOSIS — E291 Testicular hypofunction: Secondary | ICD-10-CM

## 2010-07-18 DIAGNOSIS — E119 Type 2 diabetes mellitus without complications: Secondary | ICD-10-CM

## 2010-07-18 DIAGNOSIS — E349 Endocrine disorder, unspecified: Secondary | ICD-10-CM

## 2010-07-18 LAB — TESTOSTERONE: Testosterone: 204.94 ng/dL — ABNORMAL LOW (ref 350.00–890.00)

## 2010-07-19 ENCOUNTER — Encounter: Payer: Self-pay | Admitting: Cardiovascular Disease

## 2010-07-19 NOTE — Progress Notes (Signed)
Quick Note:  Pt informed ______ 

## 2010-07-19 NOTE — Progress Notes (Signed)
Quick Note:  Pt informed, he has had 3 testosterone inj done. He is already taking 40 units twice daily. Is he to increase by 20 units to 60?  Pt has OV scheduled for next week. ______

## 2010-07-23 ENCOUNTER — Other Ambulatory Visit: Payer: Self-pay | Admitting: Family Medicine

## 2010-07-24 ENCOUNTER — Ambulatory Visit (INDEPENDENT_AMBULATORY_CARE_PROVIDER_SITE_OTHER): Payer: Medicare Other | Admitting: Cardiovascular Disease

## 2010-07-24 ENCOUNTER — Encounter: Payer: Self-pay | Admitting: Cardiovascular Disease

## 2010-07-24 VITALS — BP 113/67 | HR 74 | Resp 18 | Ht 70.0 in | Wt 274.0 lb

## 2010-07-24 DIAGNOSIS — E785 Hyperlipidemia, unspecified: Secondary | ICD-10-CM

## 2010-07-24 DIAGNOSIS — I6529 Occlusion and stenosis of unspecified carotid artery: Secondary | ICD-10-CM

## 2010-07-24 MED ORDER — ROSUVASTATIN CALCIUM 10 MG PO TABS: ORAL_TABLET | ORAL | Status: DC

## 2010-07-24 NOTE — Assessment & Plan Note (Signed)
Stable. Continue beta blocker, ASA. Will add Crestor 10 mg po on Monday, Wednesday, Friday. He will need follow up lipids and LFTs in 3 months.

## 2010-07-24 NOTE — Progress Notes (Signed)
History of Present Illness:77 yo WM with history of HTN, hyperlipidemia, morbid obesity, DM, CAD and obstructive lung disease here for routine cardiac follow-up. He tells me that he has been doing well from a CV standpoint.  He has overlapping stents in the RCA. He has moderate disease in the LAD by cath 2008. He had been on Crestor several times per week but has stopped taking that. He is not sure why he stopped it.   No chest pain. No SOB. No problems. He has been active.   His cholesterol is followed by Dr. Elease Hashimoto. He does not know why he stopped his Crestor.    Past Medical History  Diagnosis Date  . Coronary artery disease     Overlapping stents RCA. Last cath May 2008 with moderate disease LAD  . Hypertension   . Osteoarthritis     shoulder  . Restless leg   . Elevated lipids   . COPD (chronic obstructive pulmonary disease)   . Hyperlipidemia   . Nephrolithiasis   . SVT (supraventricular tachycardia)   . Dyspnea   . GERD (gastroesophageal reflux disease)   . DM (diabetes mellitus)     Type 2    Past Surgical History  Procedure Date  . Cholecystectomy     Current Outpatient Prescriptions  Medication Sig Dispense Refill  . ACCU-CHEK AVIVA test strip 1 THREE TIMES A DAY  100 each  3  . aspirin 81 MG tablet Take 81 mg by mouth daily.        . Calcium Carbonate-Vitamin D (CALCIUM + D PO) Take by mouth daily.        . Cholecalciferol (VITAMIN D-3 PO) Take by mouth daily.        . diclofenac (VOLTAREN) 75 MG EC tablet Take 75 mg by mouth 2 (two) times daily.        . fenofibrate micronized (LOFIBRA) 134 MG capsule Take 134 mg by mouth daily before breakfast.        . furosemide (LASIX) 40 MG tablet Take 40 mg by mouth daily.        Marland Kitchen gabapentin (NEURONTIN) 300 MG capsule Take 300 mg by mouth. 3 po bid      . glimepiride (AMARYL) 4 MG tablet Take 4 mg by mouth daily before breakfast.        . Lancets (ACCU-CHEK MULTICLIX) lancets TEST ONCE DAILY AS DIRECTED  100 each  2  .  LANTUS SOLOSTAR 100 UNIT/ML injection INJECT 25 UNITS INTO ABDOMINAL AREA TWICE A DAY MAY INCREASE BY 2 UNITS EVERY 3 DAYS IF ABOVE 200  15 Syringe  3  . lisinopril (PRINIVIL,ZESTRIL) 5 MG tablet Take 5 mg by mouth daily.        . metFORMIN (GLUCOPHAGE) 500 MG tablet Take 1 tablet (500 mg total) by mouth 2 (two) times daily with a meal.  60 tablet  1  . metoprolol (TOPROL-XL) 50 MG 24 hr tablet Take 50 mg by mouth daily.        . multivitamin (THERAGRAN) per tablet Take 1 tablet by mouth daily.        . nitroGLYCERIN (NITROSTAT) 0.4 MG SL tablet Place 0.4 mg under the tongue every 5 (five) minutes as needed.        Marland Kitchen omeprazole (PRILOSEC) 20 MG capsule Take 20 mg by mouth daily.        Marland Kitchen spironolactone (ALDACTONE) 25 MG tablet Take 25 mg by mouth daily.  Current Facility-Administered Medications  Medication Dose Route Frequency Provider Last Rate Last Dose  . testosterone cypionate (DEPOTESTOTERONE CYPIONATE) injection 200 mg  200 mg Intramuscular Q28 days Bruce W [name]   200 mg at 04/11/10 1406  . testosterone cypionate (DEPOTESTOTERONE CYPIONATE) injection 200 mg  200 mg Intramuscular Q28 days Bruce W [name]   200 mg at 05/07/10 1145  . testosterone cypionate (DEPOTESTOTERONE CYPIONATE) injection 200 mg  200 mg Intramuscular Q28 days Georgetta Haber   200 mg at 06/05/10 1524    Allergies  Allergen Reactions  . Ace Inhibitors     REACTION: cough  . Codeine     REACTION: Itch  . Penicillins     History   Social History  . Marital Status: Married    Spouse Name: N/A    Number of Children: 3  . Years of Education: N/A   Occupational History  . Retired    Social History Main Topics  . Smoking status: Former Smoker -- 1.5 packs/day for 20 years    Types: Cigarettes    Quit date: 04/05/1983  . Smokeless tobacco: Not on file  . Alcohol Use: No  . Drug Use: No  . Sexually Active: Not on file   Other Topics Concern  . Not on file   Social History Narrative    . No narrative on file    Family History  Problem Relation Age of Onset  . Coronary artery disease      Male 1st degree relative <50  . Coronary artery disease      male 1st degree relative <60  . Heart disease Father   . Migraines Father   . Ulcers Father   . Alzheimer's disease Mother   . Heart disease Mother   . Heart disease Sister   . Obesity Sister     Morbid  . Arthritis Sister   . Heart disease Brother   . Arthritis Brother   . Sleep apnea Son   . Obesity Son   . Migraines Daughter   . Thyroid disease Daughter     Review of Systems:  As stated in the HPI and otherwise negative.   BP 113/67  Pulse 74  Resp 18  Ht 5\' 10"  (1.778 m)  Wt 274 lb (124.286 kg)  BMI 39.32 kg/m2  Physical Examination: General: Well developed, well nourished, NAD HEENT: OP clear, mucus membranes moist SKIN: warm, dry. No rashes. Neuro: No focal deficits Musculoskeletal: Muscle strength 5/5 all ext Psychiatric: Mood and affect normal Neck: No JVD, no carotid bruits, no thyromegaly, no lymphadenopathy. Lungs:Clear bilaterally, no wheezes, rhonci, crackles Cardiovascular: Regular rate and rhythm. No murmurs, gallops or rubs. Abdomen:Soft. Bowel sounds present. Non-tender.  Extremities: No lower extremity edema. Pulses are 2 + in the bilateral DP/PT.  EKG: NSR, rate 74 bpm. RBBB. LAFB.

## 2010-07-24 NOTE — Patient Instructions (Signed)
Your physician recommends that you schedule a follow-up appointment in: 6 months  Your physician has recommended you make the following change in your medication: STOP LISINOPRIL.

## 2010-07-25 ENCOUNTER — Encounter: Payer: Self-pay | Admitting: Family Medicine

## 2010-07-25 ENCOUNTER — Ambulatory Visit (INDEPENDENT_AMBULATORY_CARE_PROVIDER_SITE_OTHER): Payer: Medicare Other | Admitting: Family Medicine

## 2010-07-25 DIAGNOSIS — I10 Essential (primary) hypertension: Secondary | ICD-10-CM

## 2010-07-25 DIAGNOSIS — E119 Type 2 diabetes mellitus without complications: Secondary | ICD-10-CM

## 2010-07-25 DIAGNOSIS — E349 Endocrine disorder, unspecified: Secondary | ICD-10-CM

## 2010-07-25 DIAGNOSIS — E291 Testicular hypofunction: Secondary | ICD-10-CM

## 2010-07-25 MED ORDER — TESTOSTERONE CYPIONATE 200 MG/ML IM SOLN
200.0000 mg | INTRAMUSCULAR | Status: DC
  Administered 2010-07-25: 200 mg via INTRAMUSCULAR

## 2010-07-25 MED ORDER — TESTOSTERONE CYPIONATE 200 MG/ML IM SOLN: 200.0000 mg | INTRAMUSCULAR | Status: DC

## 2010-07-25 NOTE — Progress Notes (Signed)
  Subjective:    Patient ID: Gary Yates, male    DOB: 1933-06-02, 77 y.o.   MRN: [id number]  HPI Followup medical problems. Type 2 diabetes, hyperlipidemia, morbid obesity, hypertension, history of CAD, COPD, GERD, osteoarthritis. Recent fatigue issues. Low testosterone. With testosterone replacement 200 mg IM monthly still has low level just over 200. Overall subjectively feels better but still very low stamina. No chest pains.  Diabetes with poor control. Recent A1c 9.5%. Remains on metformin and Lantus insulin 40 units twice daily. Only one subjective hypoglycemic symptoms recently and blood sugar was actually 111. Mostly blood sugars ranging 140-220. No symptoms of hyperglycemia. Trying to exercise more  Past Medical History  Diagnosis Date  . Coronary artery disease     Overlapping stents RCA. Last cath May 2008 with moderate disease LAD  . Hypertension   . Osteoarthritis     shoulder  . Restless leg   . Elevated lipids   . COPD (chronic obstructive pulmonary disease)   . Hyperlipidemia   . Nephrolithiasis   . SVT (supraventricular tachycardia)   . Dyspnea   . GERD (gastroesophageal reflux disease)   . DM (diabetes mellitus)     Type 2   Past Surgical History  Procedure Date  . Cholecystectomy     reports that he quit smoking about 27 years ago. His smoking use included Cigarettes. He has a 30 pack-year smoking history. He does not have any smokeless tobacco history on file. He reports that he does not drink alcohol or use illicit drugs. family history includes Alzheimer's disease in his mother; Arthritis in his brother and sister; Coronary artery disease in some unspecified family members; Heart disease in his brother, father, mother, and sister; Migraines in his daughter and father; Obesity in his sister and son; Sleep apnea in his son; Thyroid disease in his daughter; and Ulcers in his father. Allergies  Allergen Reactions  . Ace Inhibitors     REACTION: cough  . Codeine       REACTION: Itch  . Penicillins       Review of Systems  Constitutional: Negative for fatigue.  Eyes: Negative for visual disturbance.  Respiratory: Negative for cough, chest tightness and shortness of breath.   Cardiovascular: Negative for chest pain, palpitations and leg swelling.  Neurological: Negative for dizziness, syncope, weakness, light-headedness and headaches.       Objective:   Physical Exam  Constitutional: He is oriented to person, place, and time. He appears well-developed and well-nourished.  HENT:  Head: Normocephalic.  Right Ear: External ear normal.  Left Ear: External ear normal.  Mouth/Throat: Oropharynx is clear and moist.  Cardiovascular: Normal rate, regular rhythm and normal heart sounds.   Pulmonary/Chest: Effort normal and breath sounds normal. No respiratory distress. He has no wheezes. He has no rales.  Musculoskeletal: He exhibits no edema.       Feet reveal no skin lesions. Good distal foot pulses. Good capillary refill. No calluses. Impaired sensation with monofilament testing great toe bilaterally   Neurological: He is alert and oriented to person, place, and time.  Psychiatric: He has a normal mood and affect.          Assessment & Plan:  #1 type 2 diabetes poor control.  Titrate Lantus up 2 units every 3 days until fasting blood sugars around 130 or less and work on weight loss reassess A1c 3 months #2 low testosterone. Increase 200 mg every other week. Patient will check on coverage for topical

## 2010-07-25 NOTE — Patient Instructions (Signed)
Titrate insulin up by 2 units every 3 days until fasting glucose consistently around 130 or less.

## 2010-08-03 ENCOUNTER — Other Ambulatory Visit: Payer: Self-pay | Admitting: Internal Medicine

## 2010-08-03 DIAGNOSIS — I6529 Occlusion and stenosis of unspecified carotid artery: Secondary | ICD-10-CM

## 2010-08-06 ENCOUNTER — Ambulatory Visit: Payer: Self-pay | Admitting: Family Medicine

## 2010-08-07 ENCOUNTER — Encounter (INDEPENDENT_AMBULATORY_CARE_PROVIDER_SITE_OTHER): Payer: Medicare Other | Admitting: *Deleted

## 2010-08-07 DIAGNOSIS — I6529 Occlusion and stenosis of unspecified carotid artery: Secondary | ICD-10-CM

## 2010-08-08 ENCOUNTER — Ambulatory Visit: Payer: Medicare Other | Admitting: Family Medicine

## 2010-08-08 DIAGNOSIS — E291 Testicular hypofunction: Secondary | ICD-10-CM

## 2010-08-08 DIAGNOSIS — Z8639 Personal history of other endocrine, nutritional and metabolic disease: Secondary | ICD-10-CM

## 2010-08-08 MED ORDER — TESTOSTERONE CYPIONATE 200 MG/ML IM SOLN
200.0000 mg | INTRAMUSCULAR | Status: DC
  Administered 2010-08-08: 200 mg via INTRAMUSCULAR

## 2010-08-10 ENCOUNTER — Encounter: Payer: Self-pay | Admitting: Cardiovascular Disease

## 2010-08-21 ENCOUNTER — Other Ambulatory Visit: Payer: Self-pay | Admitting: Family Medicine

## 2010-08-21 ENCOUNTER — Ambulatory Visit: Payer: Medicare Other | Admitting: Family Medicine

## 2010-08-21 DIAGNOSIS — E291 Testicular hypofunction: Secondary | ICD-10-CM

## 2010-08-21 DIAGNOSIS — Z8639 Personal history of other endocrine, nutritional and metabolic disease: Secondary | ICD-10-CM

## 2010-08-21 MED ORDER — TESTOSTERONE CYPIONATE 200 MG/ML IM SOLN
200.0000 mg | INTRAMUSCULAR | Status: DC
  Administered 2010-08-21: 200 mg via INTRAMUSCULAR

## 2010-08-22 ENCOUNTER — Ambulatory Visit: Payer: Medicare Other | Admitting: Family Medicine

## 2010-08-22 ENCOUNTER — Other Ambulatory Visit: Payer: Self-pay | Admitting: *Deleted

## 2010-08-22 MED ORDER — GLUCOSE BLOOD VI STRP: ORAL_STRIP | Status: DC

## 2010-09-03 ENCOUNTER — Ambulatory Visit (INDEPENDENT_AMBULATORY_CARE_PROVIDER_SITE_OTHER): Payer: Medicare Other | Admitting: Family Medicine

## 2010-09-03 DIAGNOSIS — E349 Endocrine disorder, unspecified: Secondary | ICD-10-CM

## 2010-09-03 DIAGNOSIS — E291 Testicular hypofunction: Secondary | ICD-10-CM

## 2010-09-03 MED ORDER — TESTOSTERONE CYPIONATE 200 MG/ML IM SOLN
200.0000 mg | Freq: Once | INTRAMUSCULAR | Status: AC
  Administered 2010-09-03: 200 mg via INTRAMUSCULAR

## 2010-09-04 ENCOUNTER — Ambulatory Visit: Payer: Medicare Other | Admitting: Family Medicine

## 2010-09-17 ENCOUNTER — Ambulatory Visit: Payer: Medicare Other | Admitting: Family Medicine

## 2010-09-19 ENCOUNTER — Ambulatory Visit: Payer: Medicare Other | Admitting: Family Medicine

## 2010-09-19 ENCOUNTER — Telehealth: Payer: Self-pay | Admitting: *Deleted

## 2010-09-19 DIAGNOSIS — E291 Testicular hypofunction: Secondary | ICD-10-CM

## 2010-09-19 DIAGNOSIS — Z8639 Personal history of other endocrine, nutritional and metabolic disease: Secondary | ICD-10-CM

## 2010-09-19 MED ORDER — TESTOSTERONE CYPIONATE 200 MG/ML IM SOLN
200.0000 mg | INTRAMUSCULAR | Status: DC
  Administered 2010-09-19: 200 mg via INTRAMUSCULAR

## 2010-09-19 NOTE — Telephone Encounter (Signed)
Pt here for bi weekly testosterone inj.  He has F/U appt scheduled for 10/18.  He is anxious to find out if his testosterone is improving, requesting labs prior to OV, he would also need A1-C?

## 2010-09-19 NOTE — Telephone Encounter (Signed)
Yes.  Go ahead with A1C and total testosterone.

## 2010-09-19 NOTE — Telephone Encounter (Signed)
Pt informed of future order

## 2010-10-02 ENCOUNTER — Telehealth: Payer: Self-pay | Admitting: Critical Care Medicine

## 2010-10-02 NOTE — Telephone Encounter (Signed)
Pt can also be reached at [phone].Gary Yates

## 2010-10-02 NOTE — Telephone Encounter (Signed)
I spoke with pt and he states he is not sure what medication he needed to be on for his copd. Last time pt was seen was 02/2008. Pt is scheduled to come in and see PW 10/30 at 9:15 for follow up

## 2010-10-03 ENCOUNTER — Ambulatory Visit (INDEPENDENT_AMBULATORY_CARE_PROVIDER_SITE_OTHER): Payer: Medicare Other | Admitting: Family Medicine

## 2010-10-03 DIAGNOSIS — Z23 Encounter for immunization: Secondary | ICD-10-CM

## 2010-10-03 DIAGNOSIS — E119 Type 2 diabetes mellitus without complications: Secondary | ICD-10-CM

## 2010-10-03 DIAGNOSIS — Z8639 Personal history of other endocrine, nutritional and metabolic disease: Secondary | ICD-10-CM

## 2010-10-03 MED ORDER — TESTOSTERONE CYPIONATE 200 MG/ML IM SOLN
200.0000 mg | INTRAMUSCULAR | Status: DC
  Administered 2010-10-03: 200 mg via INTRAMUSCULAR

## 2010-10-04 LAB — TESTOSTERONE, FREE, TOTAL, SHBG: Sex Hormone Binding: 20 nmol/L (ref 13–71)

## 2010-10-04 NOTE — Progress Notes (Signed)
Quick Note:  Please review ______

## 2010-10-04 NOTE — Progress Notes (Signed)
Pt aware of results 

## 2010-10-08 ENCOUNTER — Ambulatory Visit (INDEPENDENT_AMBULATORY_CARE_PROVIDER_SITE_OTHER): Payer: Medicare Other | Admitting: Family Medicine

## 2010-10-08 ENCOUNTER — Encounter: Payer: Self-pay | Admitting: Family Medicine

## 2010-10-08 VITALS — BP 110/64 | Temp 98.7°F | Wt 270.0 lb

## 2010-10-08 DIAGNOSIS — J209 Acute bronchitis, unspecified: Secondary | ICD-10-CM

## 2010-10-08 MED ORDER — AZITHROMYCIN 250 MG PO TABS: ORAL_TABLET | ORAL | Status: AC

## 2010-10-08 NOTE — Progress Notes (Signed)
  Subjective:    Patient ID: Gary Yates, male    DOB: 08-04-1933, 77 y.o.   MRN: [id number]  HPI Patient seen with acute respiratory illness. Onset a few days ago. Sore throat, headache, body aches and cough productive of yellow sputum. He has multiple other problems including history of CAD type 2 diabetes, obesity, COPD, and GERD. He takes Benadryl which is chronically for his congestive symptoms. Has recently had some slow urinary stream actually has appointment in a few days to discuss this.  He had significant aches legs and feet and stopped Crestor couple weeks ago with some improvement.   Review of Systems  Constitutional: Positive for fatigue. Negative for fever, chills, appetite change and unexpected weight change.  HENT: Positive for sore throat. Negative for hearing loss.   Respiratory: Positive for cough. Negative for shortness of breath and wheezing.   Cardiovascular: Negative for chest pain.  Genitourinary: Negative for dysuria.       Objective:   Physical Exam  Constitutional: He appears well-developed and well-nourished.  HENT:  Right Ear: External ear normal.  Left Ear: External ear normal.  Mouth/Throat: Oropharynx is clear and moist.  Neck: Neck supple.  Cardiovascular: Normal rate and regular rhythm.   Pulmonary/Chest: Effort normal and breath sounds normal. No respiratory distress. He has no wheezes. He has no rales.  Musculoskeletal: He exhibits no edema.  Lymphadenopathy:    He has no cervical adenopathy.          Assessment & Plan:  Acute bronchitis. Given comorbidities and productive cough start Zithromax. Avoid Benadryl to avoid exacerbation of possible BPH symptoms.

## 2010-10-11 ENCOUNTER — Ambulatory Visit (INDEPENDENT_AMBULATORY_CARE_PROVIDER_SITE_OTHER): Payer: Medicare Other | Admitting: Family Medicine

## 2010-10-11 ENCOUNTER — Encounter: Payer: Self-pay | Admitting: Family Medicine

## 2010-10-11 DIAGNOSIS — E291 Testicular hypofunction: Secondary | ICD-10-CM

## 2010-10-11 DIAGNOSIS — E785 Hyperlipidemia, unspecified: Secondary | ICD-10-CM

## 2010-10-11 DIAGNOSIS — E349 Endocrine disorder, unspecified: Secondary | ICD-10-CM

## 2010-10-11 DIAGNOSIS — I10 Essential (primary) hypertension: Secondary | ICD-10-CM

## 2010-10-11 DIAGNOSIS — E119 Type 2 diabetes mellitus without complications: Secondary | ICD-10-CM

## 2010-10-11 NOTE — Progress Notes (Signed)
  Subjective:    Patient ID: Gary Yates, male    DOB: 1933/01/13, 77 y.o.   MRN: [id number]  HPI Patient seen for medical followup. Type diabetes. On multiple medications. Recent A1c 7.8% which is improving. Feels much better since starting testosterone replacement. More active. Still no formal exercise. No hypoglycemia. Not monitoring blood sugars regularly. Currently takes Lantus 40 units daily.  Hypertension stable. Medications reviewed. No orthostasis. No recent chest pains.  History of chronic insomnia. Sleeping somewhat better since titrating Neurontin and currently taking 600 mg at night. Less diabetic neuropathy pain.  Recent acute bronchitis symptoms improved. Finishing Zithromax. No fever or chills.  Hyperlipidemia history. Previous myalgias with statins. Has started back very low dose Crestor 5 mg every other day and so far tolerating well.   Review of Systems  Constitutional: Negative for fever, appetite change, fatigue and unexpected weight change.  Eyes: Negative for visual disturbance.  Respiratory: Positive for cough. Negative for shortness of breath and wheezing.   Cardiovascular: Negative for chest pain, palpitations and leg swelling.  Gastrointestinal: Negative for abdominal pain.  Genitourinary: Negative for dysuria.  Neurological: Negative for dizziness and headaches.  Psychiatric/Behavioral: Negative for dysphoric mood.       Objective:   Physical Exam  Constitutional: He is oriented to person, place, and time. He appears well-developed and well-nourished.  HENT:  Right Ear: External ear normal.  Left Ear: External ear normal.  Mouth/Throat: Oropharynx is clear and moist.  Neck: Neck supple. No thyromegaly present.  Cardiovascular: Normal rate and regular rhythm.   Pulmonary/Chest: Effort normal and breath sounds normal. No respiratory distress. He has no wheezes. He has no rales.  Musculoskeletal: He exhibits no edema.       Feet reveal no skin lesions.  Good distal foot pulses. Good capillary refill. No calluses. Normal sensation with monofilament testing   Lymphadenopathy:    He has no cervical adenopathy.  Neurological: He is alert and oriented to person, place, and time.          Assessment & Plan:  #1 type 2 diabetes. Improving. Recheck A1c in 3 months with future order placed #2 history of hyperlipidemia. Continue low-dose Crestor. Check lipid followup #3 acute bronchitis improving #4 hypertension stable #5 chronic insomnia. Sleep hygiene discussed. #6 hypogonadism. Mood improved with replacement. Overall improved energy levels.

## 2010-10-16 ENCOUNTER — Telehealth: Payer: Self-pay | Admitting: *Deleted

## 2010-10-16 MED ORDER — AMITRIPTYLINE HCL 10 MG PO TABS: 10.0000 mg | ORAL_TABLET | Freq: Every day | ORAL | Status: DC

## 2010-10-16 NOTE — Telephone Encounter (Signed)
Notified pt. 

## 2010-10-16 NOTE — Telephone Encounter (Signed)
Pt is having severe pain with his neuropathy.  Is asking to increase Neurontin or to be prescribed something else that might help.  Cannot get shoes on, and is miserable.

## 2010-10-16 NOTE — Telephone Encounter (Signed)
Lets add low dose elavil 10 mg po qhs

## 2010-10-18 ENCOUNTER — Ambulatory Visit: Payer: Medicare Other | Admitting: Family Medicine

## 2010-10-18 DIAGNOSIS — E291 Testicular hypofunction: Secondary | ICD-10-CM

## 2010-10-18 DIAGNOSIS — Z8639 Personal history of other endocrine, nutritional and metabolic disease: Secondary | ICD-10-CM

## 2010-10-18 MED ORDER — TESTOSTERONE CYPIONATE 200 MG/ML IM SOLN: 200.0000 mg | INTRAMUSCULAR | Status: DC

## 2010-10-18 MED ORDER — TESTOSTERONE CYPIONATE 200 MG/ML IM SOLN
200.0000 mg | INTRAMUSCULAR | Status: DC
  Administered 2010-10-18: 200 mg via INTRAMUSCULAR

## 2010-10-25 ENCOUNTER — Ambulatory Visit: Payer: Medicare Other | Admitting: Family Medicine

## 2010-10-29 ENCOUNTER — Other Ambulatory Visit: Payer: Self-pay | Admitting: *Deleted

## 2010-10-29 MED ORDER — GLIMEPIRIDE 4 MG PO TABS: 4.0000 mg | ORAL_TABLET | Freq: Every day | ORAL | Status: DC

## 2010-10-29 MED ORDER — METOPROLOL SUCCINATE ER 50 MG PO TB24: 50.0000 mg | ORAL_TABLET | Freq: Every day | ORAL | Status: DC

## 2010-10-29 MED ORDER — LISINOPRIL 5 MG PO TABS: 5.0000 mg | ORAL_TABLET | Freq: Every day | ORAL | Status: DC

## 2010-11-01 ENCOUNTER — Ambulatory Visit: Payer: Medicare Other | Admitting: Family Medicine

## 2010-11-01 DIAGNOSIS — Z8639 Personal history of other endocrine, nutritional and metabolic disease: Secondary | ICD-10-CM

## 2010-11-01 DIAGNOSIS — E291 Testicular hypofunction: Secondary | ICD-10-CM

## 2010-11-01 MED ORDER — TESTOSTERONE CYPIONATE 200 MG/ML IM SOLN
200.0000 mg | INTRAMUSCULAR | Status: DC
  Administered 2010-11-01 – 2010-11-14 (×2): 200 mg via INTRAMUSCULAR

## 2010-11-06 ENCOUNTER — Ambulatory Visit: Payer: Medicare Other | Admitting: Critical Care Medicine

## 2010-11-11 ENCOUNTER — Other Ambulatory Visit: Payer: Self-pay | Admitting: Family Medicine

## 2010-11-12 ENCOUNTER — Telehealth: Payer: Self-pay | Admitting: Family Medicine

## 2010-11-12 MED ORDER — INSULIN GLARGINE 100 UNIT/ML ~~LOC~~ SOLN: 40.0000 [IU] | Freq: Two times a day (BID) | SUBCUTANEOUS | Status: DC

## 2010-11-12 NOTE — Telephone Encounter (Signed)
Pharmacy called and said that pt is taking 40 units bid on LANTUS SOLOSTAR 100 UNIT/ML injection. Script needs to be re-written and resent asap for insurance purposes.

## 2010-11-12 NOTE — Telephone Encounter (Signed)
This was taken care of earlier today, e-scribe

## 2010-11-12 NOTE — Telephone Encounter (Signed)
I spoke with pharmacy and ordered 2 boxes (5 pens in each box) a 1 month supply, with 11 refills

## 2010-11-14 ENCOUNTER — Ambulatory Visit: Payer: Medicare Other | Admitting: Family Medicine

## 2010-11-14 DIAGNOSIS — E291 Testicular hypofunction: Secondary | ICD-10-CM

## 2010-11-14 DIAGNOSIS — Z8639 Personal history of other endocrine, nutritional and metabolic disease: Secondary | ICD-10-CM

## 2010-11-14 MED ORDER — TESTOSTERONE CYPIONATE 200 MG/ML IM SOLN: 200.0000 mg | INTRAMUSCULAR | Status: DC

## 2010-11-15 ENCOUNTER — Ambulatory Visit: Payer: Medicare Other | Admitting: Family Medicine

## 2010-11-26 ENCOUNTER — Telehealth: Payer: Self-pay | Admitting: Family Medicine

## 2010-11-26 MED ORDER — OMEPRAZOLE 20 MG PO CPDR: 20.0000 mg | DELAYED_RELEASE_CAPSULE | Freq: Every day | ORAL | Status: DC

## 2010-11-26 MED ORDER — FEXOFENADINE HCL 180 MG PO TABS: 180.0000 mg | ORAL_TABLET | Freq: Every day | ORAL | Status: DC

## 2010-11-26 MED ORDER — SPIRONOLACTONE 25 MG PO TABS: 25.0000 mg | ORAL_TABLET | Freq: Every day | ORAL | Status: DC

## 2010-11-26 MED ORDER — GLIMEPIRIDE 4 MG PO TABS: 4.0000 mg | ORAL_TABLET | Freq: Every day | ORAL | Status: DC

## 2010-11-26 MED ORDER — GABAPENTIN 300 MG PO CAPS: 300.0000 mg | ORAL_CAPSULE | Freq: Three times a day (TID) | ORAL | Status: DC

## 2010-11-26 NOTE — Telephone Encounter (Signed)
Refill Gabapentin tid, Omeprazole 1qd, Spironolactone and Glimepride and Fenofibrate to [facility] mail order pharmacy asap. Thanks.

## 2010-11-27 ENCOUNTER — Other Ambulatory Visit: Payer: Self-pay | Admitting: *Deleted

## 2010-11-27 ENCOUNTER — Ambulatory Visit: Payer: Medicare Other | Admitting: Family Medicine

## 2010-11-27 MED ORDER — GABAPENTIN 300 MG PO CAPS: ORAL_CAPSULE | ORAL | Status: DC

## 2010-11-27 NOTE — Telephone Encounter (Signed)
Correction needed for sig for Gabapentin.  I spoke with pt and corrected and refilled

## 2010-11-28 ENCOUNTER — Ambulatory Visit (INDEPENDENT_AMBULATORY_CARE_PROVIDER_SITE_OTHER): Payer: Medicare Other | Admitting: Family Medicine

## 2010-11-28 ENCOUNTER — Ambulatory Visit: Payer: Medicare Other | Admitting: Critical Care Medicine

## 2010-11-28 DIAGNOSIS — E349 Endocrine disorder, unspecified: Secondary | ICD-10-CM

## 2010-11-28 DIAGNOSIS — E291 Testicular hypofunction: Secondary | ICD-10-CM

## 2010-11-28 MED ORDER — TESTOSTERONE CYPIONATE 200 MG/ML IM SOLN: 200.0000 mg | INTRAMUSCULAR | Status: DC

## 2010-11-28 MED ORDER — TESTOSTERONE CYPIONATE 200 MG/ML IM SOLN
200.0000 mg | INTRAMUSCULAR | Status: DC
  Administered 2010-11-28: 200 mg via INTRAMUSCULAR

## 2010-11-28 MED ORDER — FENOFIBRATE MICRONIZED 134 MG PO CAPS: 134.0000 mg | ORAL_CAPSULE | Freq: Every day | ORAL | Status: DC

## 2010-11-28 NOTE — Progress Notes (Signed)
Quick Note:  Spoke with wife - labs improving - continue current meds , diet , exercise and keep appt 01/23/11 ______

## 2010-12-11 ENCOUNTER — Telehealth: Payer: Self-pay

## 2010-12-11 NOTE — Telephone Encounter (Signed)
Would try OTC MUcinex and if fever or worsening need to be seen.

## 2010-12-11 NOTE — Telephone Encounter (Signed)
Pt aware.  If fever or worsening pt will call to make an appt.

## 2010-12-11 NOTE — Telephone Encounter (Signed)
Left a message for pt to return call 

## 2010-12-11 NOTE — Telephone Encounter (Signed)
Pt called and stated he has been coughing up mucus.  Pt states he does not feel bad he just wants an rx called in to [facility] [address]  Pls advise.

## 2010-12-12 ENCOUNTER — Ambulatory Visit: Payer: Medicare Other | Admitting: Family Medicine

## 2010-12-17 ENCOUNTER — Ambulatory Visit (INDEPENDENT_AMBULATORY_CARE_PROVIDER_SITE_OTHER): Payer: Medicare Other | Admitting: Family Medicine

## 2010-12-17 ENCOUNTER — Encounter: Payer: Self-pay | Admitting: Family Medicine

## 2010-12-17 VITALS — BP 130/78 | Temp 98.7°F | Wt 272.0 lb

## 2010-12-17 DIAGNOSIS — G609 Hereditary and idiopathic neuropathy, unspecified: Secondary | ICD-10-CM

## 2010-12-17 DIAGNOSIS — E1142 Type 2 diabetes mellitus with diabetic polyneuropathy: Secondary | ICD-10-CM | POA: Insufficient documentation

## 2010-12-17 DIAGNOSIS — R05 Cough: Secondary | ICD-10-CM

## 2010-12-17 DIAGNOSIS — R059 Cough, unspecified: Secondary | ICD-10-CM

## 2010-12-17 DIAGNOSIS — E1149 Type 2 diabetes mellitus with other diabetic neurological complication: Secondary | ICD-10-CM

## 2010-12-17 MED ORDER — BENZONATATE 200 MG PO CAPS: 200.0000 mg | ORAL_CAPSULE | Freq: Three times a day (TID) | ORAL | Status: DC | PRN

## 2010-12-17 NOTE — Progress Notes (Signed)
  Subjective:    Patient ID: Gary Yates, male    DOB: [birth year], 77 y.o.   MRN: [id number]  HPI  Patient seen with persistent cough and congestion over about 8 days. Cough especially bothersome at night. Mostly nonproductive. Using Mucinex without much relief.  Denies any fever. He is an ex-smoker. No hemoptysis. No pleuritic pain.  Continues to have significant leg and foot pain. Remains on gabapentin with recent addition of amitriptyline 10 mg.   Has not helped much. He has recently had some pains radiating left lower lumbar to the foot. No weakness. No incontinence symptoms.   Review of Systems  Constitutional: Negative for fever, chills, appetite change and unexpected weight change.  HENT: Positive for congestion.   Respiratory: Positive for cough.   Cardiovascular: Negative for chest pain, palpitations and leg swelling.  Gastrointestinal: Negative for abdominal pain.  Genitourinary: Negative for dysuria.  Neurological: Negative for dizziness and weakness.       Objective:   Physical Exam  Constitutional: He appears well-developed and well-nourished. No distress.  Neck: Neck supple.  Cardiovascular: Normal rate and regular rhythm.   Pulmonary/Chest: Effort normal and breath sounds normal. No respiratory distress. He has no wheezes. He has no rales.  Musculoskeletal: He exhibits no edema.  Lymphadenopathy:    He has no cervical adenopathy.          Assessment & Plan:  #1 cough. Suspect acute viral bronchitis. Tessalon Perles 200 mg every 8 hours as needed for cough. Patient is intolerant to hydrocodone. Followup promptly for fever #2 chronic neuropathic pain. Titrate amitriptyline 20 mg at night. He has followup appointment in January and reassess then

## 2010-12-17 NOTE — Patient Instructions (Signed)
Follow up immediately for any fever or worsening symptoms. Increase amitriptyline to 20 mg at night.

## 2010-12-24 ENCOUNTER — Telehealth: Payer: Self-pay

## 2010-12-24 MED ORDER — BENZONATATE 200 MG PO CAPS: 200.0000 mg | ORAL_CAPSULE | Freq: Three times a day (TID) | ORAL | Status: DC | PRN

## 2010-12-24 NOTE — Telephone Encounter (Signed)
Pt was seen last week and given Benzonatate pearls.  Pt states he has gotten better and his mucus is not as dark as before but he is still having coughing spells.  Pt would like to know if another rx can be sent to the pharmacy for cough.  Pt is almost out of the Benzonatate pearls.  Pls advise.

## 2010-12-24 NOTE — Telephone Encounter (Signed)
Rx sent to pharmacy. Pt is aware.  

## 2010-12-24 NOTE — Telephone Encounter (Signed)
Refill benzonatate 200 mg one every 8 hours as needed for cough #30 with no refill

## 2011-01-08 ENCOUNTER — Other Ambulatory Visit: Payer: Self-pay | Admitting: Family Medicine

## 2011-01-16 ENCOUNTER — Other Ambulatory Visit (INDEPENDENT_AMBULATORY_CARE_PROVIDER_SITE_OTHER): Payer: Medicare Other

## 2011-01-16 ENCOUNTER — Other Ambulatory Visit: Payer: Self-pay | Admitting: Family Medicine

## 2011-01-16 DIAGNOSIS — E119 Type 2 diabetes mellitus without complications: Secondary | ICD-10-CM

## 2011-01-16 DIAGNOSIS — E785 Hyperlipidemia, unspecified: Secondary | ICD-10-CM

## 2011-01-16 DIAGNOSIS — I10 Essential (primary) hypertension: Secondary | ICD-10-CM

## 2011-01-16 LAB — HEMOGLOBIN A1C: Hgb A1c MFr Bld: 9.1 % — ABNORMAL HIGH (ref 4.6–6.5)

## 2011-01-16 LAB — LIPID PANEL
Cholesterol: 283 mg/dL — ABNORMAL HIGH (ref 0–200)
Total CHOL/HDL Ratio: 8

## 2011-01-16 LAB — HEPATIC FUNCTION PANEL
ALT: 19 U/L (ref 0–53)
AST: 40 U/L — ABNORMAL HIGH (ref 0–37)
Alkaline Phosphatase: 44 U/L (ref 39–117)
Total Bilirubin: 0.6 mg/dL (ref 0.3–1.2)

## 2011-01-16 LAB — BASIC METABOLIC PANEL
Chloride: 104 mEq/L (ref 96–112)
GFR: 22.87 mL/min — ABNORMAL LOW (ref >60.00)
Glucose, Bld: 145 mg/dL — ABNORMAL HIGH (ref 70–99)
Potassium: 4.9 mEq/L (ref 3.5–5.1)
Sodium: 138 mEq/L (ref 135–145)

## 2011-01-23 ENCOUNTER — Ambulatory Visit (INDEPENDENT_AMBULATORY_CARE_PROVIDER_SITE_OTHER): Payer: Medicare Other | Admitting: Family Medicine

## 2011-01-23 ENCOUNTER — Encounter: Payer: Self-pay | Admitting: Family Medicine

## 2011-01-23 DIAGNOSIS — G609 Hereditary and idiopathic neuropathy, unspecified: Secondary | ICD-10-CM

## 2011-01-23 DIAGNOSIS — N189 Chronic kidney disease, unspecified: Secondary | ICD-10-CM

## 2011-01-23 DIAGNOSIS — E1149 Type 2 diabetes mellitus with other diabetic neurological complication: Secondary | ICD-10-CM

## 2011-01-23 DIAGNOSIS — E785 Hyperlipidemia, unspecified: Secondary | ICD-10-CM

## 2011-01-23 DIAGNOSIS — I10 Essential (primary) hypertension: Secondary | ICD-10-CM

## 2011-01-23 DIAGNOSIS — K219 Gastro-esophageal reflux disease without esophagitis: Secondary | ICD-10-CM

## 2011-01-23 DIAGNOSIS — E1142 Type 2 diabetes mellitus with diabetic polyneuropathy: Secondary | ICD-10-CM

## 2011-01-23 DIAGNOSIS — E119 Type 2 diabetes mellitus without complications: Secondary | ICD-10-CM

## 2011-01-23 MED ORDER — ATORVASTATIN CALCIUM 10 MG PO TABS: 10.0000 mg | ORAL_TABLET | Freq: Every day | ORAL | Status: DC

## 2011-01-23 MED ORDER — TESTOSTERONE CYPIONATE 200 MG/ML IM SOLN: 200.0000 mg | INTRAMUSCULAR | Status: DC

## 2011-01-23 NOTE — Progress Notes (Signed)
Subjective:    Patient ID: Gary Yates, male    DOB: [birth year], 78 y.o.   MRN: [id number]  HPI  Multiple chronic medical problems- obesity, type 2 diabetes, dyslipidemia, hypertension, CAD, peripheral vascular disease, GERD, osteoarthritis,  and diabetic peripheral neuropathy. Also probably has some chronic kidney disease. Recent labs significant for creatinine of 2.9 with elevated BUN. Previous creatinine 1.9 back in March. Patient takes spirinolactone and occasional Lasix though no recent edema problems. Denies any recent nonsteroidal use.  Dyslipidemia. Had been on Crestor but stopped because of muscle fatigue. Muscle fatigue did improve off Crestor. He has high triglycerides and elevated cholesterol. Does not recall prior use of other statins.  Type 2 diabetes. Last A1c 7.9% until recent 9.1%. Takes Lantus 40 units twice daily. Also remains on metformin and glimepiride. No recent hypoglycemia. Requesting nutrition referral. Poor compliance with diet and exercise.  History of low testosterone. Recently off injections because of cost issues. Overall more fatigued.  Past Medical History  Diagnosis Date  . Coronary artery disease     Overlapping stents RCA. Last cath May 2008 with moderate disease LAD  . Hypertension   . Osteoarthritis     shoulder  . Restless leg   . Elevated lipids   . COPD (chronic obstructive pulmonary disease)   . Hyperlipidemia   . Nephrolithiasis   . SVT (supraventricular tachycardia)   . Dyspnea   . GERD (gastroesophageal reflux disease)   . DM (diabetes mellitus)     Type 2   Past Surgical History  Procedure Date  . Cholecystectomy     reports that he quit smoking about 27 years ago. His smoking use included Cigarettes. He has a 30 pack-year smoking history. He does not have any smokeless tobacco history on file. He reports that he does not drink alcohol or use illicit drugs. family history includes Alzheimer's disease in his mother; Arthritis in his  brother and sister; Coronary artery disease in some unspecified family members; Heart disease in his brother, father, mother, and sister; Migraines in his daughter and father; Obesity in his sister and son; Sleep apnea in his son; Thyroid disease in his daughter; and Ulcers in his father. Allergies  Allergen Reactions  . Ace Inhibitors     REACTION: cough  . Codeine     REACTION: Itch  . Penicillins      Review of Systems  Constitutional: Negative for fatigue and unexpected weight change.  HENT: Positive for congestion.   Eyes: Negative for visual disturbance.  Respiratory: Negative for cough, chest tightness and shortness of breath.   Cardiovascular: Negative for chest pain, palpitations and leg swelling.  Musculoskeletal: Negative for myalgias.  Neurological: Negative for dizziness, syncope, weakness, light-headedness and headaches.       Objective:   Physical Exam  Constitutional: He is oriented to person, place, and time. He appears well-developed and well-nourished.  HENT:  Right Ear: External ear normal.  Left Ear: External ear normal.  Mouth/Throat: Oropharynx is clear and moist.  Neck: Neck supple.  Cardiovascular: Normal rate and regular rhythm.   Pulmonary/Chest: Effort normal and breath sounds normal. No respiratory distress. He has no wheezes. He has no rales.  Musculoskeletal: He exhibits no edema.  Lymphadenopathy:    He has no cervical adenopathy.  Neurological: He is alert and oriented to person, place, and time.          Assessment & Plan:  #1 type 2 diabetes. Poor compliance. Poor control. Nutrition referral. Discussed titration of insulin  or Mealtime insulin but already has cost issues. Work on weight loss first #2 dyslipidemia. Lipids extremely elevated. Lipitor and initiate only 10 mg daily since he is sensitive to medications. Recheck lipids in 4-6 weeks and titrate as tolerated #3 chronic kidney disease with recent elevation of BUN/creatinine. Hold  spiral lactone and furosemide for now. Recheck basic metabolic panel in 4-6 weeks. Avoid nonsteroidals #4 hypogonadism. Start back on testosterone replacement

## 2011-01-23 NOTE — Patient Instructions (Signed)
Hold spirinolactone for now and avoid Furosemide unless severe edema.

## 2011-01-28 ENCOUNTER — Ambulatory Visit (INDEPENDENT_AMBULATORY_CARE_PROVIDER_SITE_OTHER): Payer: Medicare Other | Admitting: Family Medicine

## 2011-01-28 DIAGNOSIS — Z862 Personal history of diseases of the blood and blood-forming organs and certain disorders involving the immune mechanism: Secondary | ICD-10-CM

## 2011-01-28 DIAGNOSIS — Z8639 Personal history of other endocrine, nutritional and metabolic disease: Secondary | ICD-10-CM

## 2011-01-28 MED ORDER — TESTOSTERONE CYPIONATE 200 MG/ML IM SOLN
200.0000 mg | INTRAMUSCULAR | Status: DC
  Administered 2011-01-28: 200 mg via INTRAMUSCULAR

## 2011-02-13 ENCOUNTER — Telehealth: Payer: Self-pay | Admitting: *Deleted

## 2011-02-13 NOTE — Telephone Encounter (Signed)
Pt calling concerned about COPD on his problem list.  In 2009 he reports he had a stubborn bronchitis and needed an inhaler for a week or so. Pt not able to get affordable life insurance due to this diagnosis and would like it removed from his medical record.  I asked if he discussed this at Jan OV, he said no, we had only limited time together and discussed labs.  Pt does have return OV 3/20.

## 2011-02-13 NOTE — Telephone Encounter (Signed)
This was apparently placed on problem list prior to my seeing him. I'm not sure how this was determined.  One way we can sort out is to get spirometry to see if he meets criteria for COPD.  Will discuss at next visit.

## 2011-02-14 ENCOUNTER — Telehealth: Payer: Self-pay | Admitting: Family Medicine

## 2011-02-14 ENCOUNTER — Other Ambulatory Visit: Payer: Self-pay | Admitting: *Deleted

## 2011-02-14 MED ORDER — AMITRIPTYLINE HCL 10 MG PO TABS: 10.0000 mg | ORAL_TABLET | Freq: Every day | ORAL | Status: DC

## 2011-02-14 NOTE — Telephone Encounter (Signed)
Our records show one tab at [facility] Please advise

## 2011-02-14 NOTE — Telephone Encounter (Signed)
Our records do show one daily but if he has been taking two daily OK to change in meds and refill for this amount.  I would advise against any further increase in this medication at his age.

## 2011-02-14 NOTE — Telephone Encounter (Signed)
Pt informed and voiced his understanding

## 2011-02-14 NOTE — Telephone Encounter (Signed)
Patient states his amotripoline was called in incorrectly. It was called in for 1qd and it needs to be 2qd. Please assist and inform patient. Patients script will run out today.

## 2011-02-15 ENCOUNTER — Telehealth: Payer: Self-pay | Admitting: Family Medicine

## 2011-02-15 MED ORDER — AMITRIPTYLINE HCL 10 MG PO TABS: ORAL_TABLET | ORAL | Status: DC

## 2011-02-15 NOTE — Telephone Encounter (Signed)
Addended by: Jill Side on: 02/15/2011 08:53 AM   Modules accepted: Orders

## 2011-02-15 NOTE — Telephone Encounter (Signed)
Pt called this morning and said that he is at the pharmacy and apparently the Amitriptylene was called in incorrectly again. Pt said that this needs to be 2 pill per night for #60, to last for 30 days. Pt is at the pharmacy and is waiting for this to be corrected asap. [facility] [address] [facility] [address].

## 2011-02-15 NOTE — Telephone Encounter (Signed)
Rx called in #60 with 11 refills

## 2011-02-15 NOTE — Telephone Encounter (Signed)
Rx called in as pt is at the pharmacy, #60 with 11 refills

## 2011-02-25 ENCOUNTER — Ambulatory Visit: Payer: Medicare Other | Admitting: Family Medicine

## 2011-02-27 ENCOUNTER — Ambulatory Visit: Payer: Medicare Other | Admitting: Family Medicine

## 2011-02-28 ENCOUNTER — Ambulatory Visit: Payer: Medicare Other | Admitting: Family Medicine

## 2011-03-01 ENCOUNTER — Ambulatory Visit: Payer: Medicare Other | Admitting: Family Medicine

## 2011-03-15 ENCOUNTER — Encounter: Payer: Self-pay | Admitting: Dietician

## 2011-03-15 ENCOUNTER — Encounter: Payer: Medicare Other | Attending: Family Medicine | Admitting: Dietician

## 2011-03-15 DIAGNOSIS — E119 Type 2 diabetes mellitus without complications: Secondary | ICD-10-CM

## 2011-03-15 DIAGNOSIS — Z713 Dietary counseling and surveillance: Secondary | ICD-10-CM | POA: Insufficient documentation

## 2011-03-15 NOTE — Patient Instructions (Addendum)
   Talk to your MD regarding the use of Metformin in the face of increasing renal failure.  Ask "do I need to stay on the metformin, given that my kidney/renal clearance is at 22?  Take the Glemipride 4 mg 15-30 minutes before breakfast.  With the high cost of the Lantus Solo Star, consider going to using the vial and syringe, especially when not traveling.  Check at your pharmacy the cost difference.  Make sure the pharmacy is charging the strips and lancets under medical devices not pharmacy charges.  Ask if there is a food/drug interaction with the grapefruit and any of your medications.  If shaky, sweaty, and in the yard, check your blood glucose level.  If you 100-110-120:  Have a snack of PB cracker and water or crystal light. If the test is at 80-90 take the glucose tablets or 4 oz of the regular soda or regular juice.  Try to keep the carb servings to 4 servings at meals and 1-2 servings at meal times.  Keep up the use of the splenda and crystal light.  Try to avoid the fried food.  Try to read labels and to keep the sodium to 2300 mg/ day.

## 2011-03-15 NOTE — Progress Notes (Signed)
Medical Nutrition Therapy:  Appt start time: 0930 end time:  1130.  Assessment:  Primary concerns today: Feels that his diabetes is not under control.  He is continuing to gain weight as the glucose levels continue to stay higher.  Has had a change in hid diuretic medication and feels this Brayant Dorr reflect some on his inability to loose weight.  Gives a history of having type 2 diabetes approximately 16 years.  His diabetes education includes a session with a nutritionist regarding his diet and care for his diabetes a number of years ago and the teaching his physicians have done over the years.  Currently on Lantus 40 units BID, which he does not consistently take on a daily basis.  Will take one dose in the morning, Loyal Holzheimer vary the times, generally around 8:30 AM and Kaitlynn Tramontana take a second dose after lunch or if his glucose is lower he Mauricio Dahlen skip the second dose that day.  I attempted to start some insulin teaching regarding the action and use of the basal insulin Lantus.  I feel he needs more education regarding his insulin/medication use.  Verbalizes a willingness to work toward some weight loss and glucose control.  A1C on 01/16/11 was 9.1.  His Creatinine was 2.9 mg, BUN 37 with a GFR of 22.87 ml/min.   Blood Glucose:  Generally checking one time per day, fasting.  Ranges 134-164.  Sometimes will check at other times during the day if he feel that it is going low.  He notes that he will get the shaky, sweaty, nervous symptoms when his blood glucose reaches 120-110.  With these symptoms at this level, he will treat the blood glucose "low" using glucose tablets.  We discussed how the glucose can reflect these symptoms and the use of protein and starch to treat the glucose at these higher levels. Rather than the glucose tablets.     MEDICATIONS: Diabetes Medications include:Metformin 500 mg 2 per day; Glimepride 4 mg 1 tab per day; Lantus 40 units twice a day.  At this point, I have a question regarding the use of the  Metformin with the GFR at the 22.87 ml/min.  I have ask Mr. Trabert to discuss this with Dr. Elease Hashimoto.  He Giannina Bartolome need to take his Lantus on a regular basis at 12 hour intervals and to have an increase in hid Glimepride dose to cover meal glucose excursions or consider the use of a rapid acting insulin for at least the largest or two largest meals of the day with a one dose per day Lantus.  DIETARY INTAKE:  24-hr recall:  B (9:30-10:00 AM): Bowl of cereal 1.5 cups  (cherrioes, raisin bran, wheaties) milk Whole milk or 2% 1/2 cup splenda  OR grapefruit Whole decaf coffee with splenda.  Snk (mid  AM) :none  L (12:30-1:00 PM): Sandwich Pimento cheese or chicken salad or PB and jelly (grape, regular) white wheat or whole wheat. Soua Caltagirone have an orange or a few potato chips.  Snk (mid PM): Carliss Quast have an orange D (5:30-6:30 PM): Out to dinner 5-6 times per week.  Hamburger steak 6 oz, mash potatoes 1/2 cupand couple of tablespoons gravy and pinto beans 1/2 cup with onions at a local grill and mixed canned fruit. Home:  Hot dogs 3, with 3 buns , mustard, chili, onions. Potato chips, a handful and diet coke.  OR meat or poultry, green beans or butter beans or carrots.   Snk (evening PM): orange or york peppermint patti (rare)  fruit or Oreo Cookies Beverages: decaf tea, coffee, splenda, water with crystal light  Meal intake for amount and calories and carbohydrate varies greatly.  Breakfast can be 500 calories to a meal of 120 calories with a similar change in carbohydrate intake.  Lunch is 640 calories to skipping this meal, having an orange and a few chips that would be approximately 280 calories or 60 gm of carbohydrate.  He is consuming the majority of his calories/food in the evening hours and then going to bed. His evening meal can range from 630 calories to 1100 calories to points in between. His evening snack can range from 120 calories to 280-200 calories.    Recent physical activity: Very limited.  With the  colder weather, has not done any walking and the work in the yard is limited to an occasional picking up sticks/twigs.  Estimated energy needs:HT: 70 in  WT: 275 lb  Adj. WT: 207 (94 kg)   1800-1900 calories 200-205 g carbohydrates 135-140 g protein 50-52 g fat  Progress Towards Goal(s):  In progress.   Nutritional Diagnosis:  Bath-2.1 Inpaired nutrition utilization As related to glucose.  As evidenced by Diagnosis of type 2 diabetes with A1C of 9.1 % and fasting blood glucose levels of 134-164 or greater..    Intervention:  Nutrition Review of the impact of carbohydrate foods on blood glucose levels and the need to moderate carbohydrate serving sizes and to space these through out the day.  On this day, I stressed the need to read food labels and to begin to look at moderation of portion sizes.  I have ask that he check his blood glucose levels more often, especially in the evening following his largest meal.  I ask that he work to continue to use lean protein sources, to increase his use of non-starchy vegetables, limit his carbohydrate servings to 3-4 per meal and to limit added fats at each meal to 1-2 servings.    Handouts given during visit include:  Living Well With Diabetes  Counting Carbohydrates Guide  Blood Glucose Control Work Sheet  Monitoring/Evaluation:  Dietary intake, exercise, blood glucose levels, and body weight in 6-8 weeks.

## 2011-03-19 ENCOUNTER — Encounter: Payer: Self-pay | Admitting: Dietician

## 2011-03-20 ENCOUNTER — Other Ambulatory Visit (INDEPENDENT_AMBULATORY_CARE_PROVIDER_SITE_OTHER): Payer: 59

## 2011-03-20 DIAGNOSIS — E785 Hyperlipidemia, unspecified: Secondary | ICD-10-CM

## 2011-03-20 DIAGNOSIS — E119 Type 2 diabetes mellitus without complications: Secondary | ICD-10-CM

## 2011-03-20 DIAGNOSIS — I10 Essential (primary) hypertension: Secondary | ICD-10-CM

## 2011-03-20 LAB — LIPID PANEL
Total CHOL/HDL Ratio: 6
Triglycerides: 539 mg/dL — ABNORMAL HIGH (ref 0.0–149.0)

## 2011-03-20 LAB — BASIC METABOLIC PANEL
BUN: 17 mg/dL (ref 6–23)
CO2: 27 mEq/L (ref 19–32)
Chloride: 102 mEq/L (ref 96–112)
Creatinine, Ser: 1.6 mg/dL — ABNORMAL HIGH (ref 0.4–1.5)

## 2011-03-20 LAB — HEPATIC FUNCTION PANEL
AST: 37 U/L (ref 0–37)
Total Bilirubin: 0.5 mg/dL (ref 0.3–1.2)

## 2011-03-20 LAB — LDL CHOLESTEROL, DIRECT: Direct LDL: 83.4 mg/dL

## 2011-03-27 ENCOUNTER — Ambulatory Visit (INDEPENDENT_AMBULATORY_CARE_PROVIDER_SITE_OTHER): Payer: 59 | Admitting: Family Medicine

## 2011-03-27 ENCOUNTER — Encounter: Payer: Self-pay | Admitting: Family Medicine

## 2011-03-27 VITALS — BP 122/68 | Temp 98.5°F | Wt 272.0 lb

## 2011-03-27 DIAGNOSIS — I10 Essential (primary) hypertension: Secondary | ICD-10-CM

## 2011-03-27 DIAGNOSIS — E119 Type 2 diabetes mellitus without complications: Secondary | ICD-10-CM

## 2011-03-27 DIAGNOSIS — Z862 Personal history of diseases of the blood and blood-forming organs and certain disorders involving the immune mechanism: Secondary | ICD-10-CM

## 2011-03-27 DIAGNOSIS — J449 Chronic obstructive pulmonary disease, unspecified: Secondary | ICD-10-CM

## 2011-03-27 DIAGNOSIS — Z8639 Personal history of other endocrine, nutritional and metabolic disease: Secondary | ICD-10-CM

## 2011-03-27 DIAGNOSIS — E785 Hyperlipidemia, unspecified: Secondary | ICD-10-CM | POA: Insufficient documentation

## 2011-03-27 DIAGNOSIS — R0609 Other forms of dyspnea: Secondary | ICD-10-CM

## 2011-03-27 DIAGNOSIS — R0989 Other specified symptoms and signs involving the circulatory and respiratory systems: Secondary | ICD-10-CM

## 2011-03-27 DIAGNOSIS — E291 Testicular hypofunction: Secondary | ICD-10-CM

## 2011-03-27 DIAGNOSIS — E349 Endocrine disorder, unspecified: Secondary | ICD-10-CM

## 2011-03-27 DIAGNOSIS — R0600 Dyspnea, unspecified: Secondary | ICD-10-CM

## 2011-03-27 LAB — GLUCOSE, POCT (MANUAL RESULT ENTRY): POC Glucose: 183

## 2011-03-27 MED ORDER — TESTOSTERONE CYPIONATE 200 MG/ML IM SOLN
200.0000 mg | INTRAMUSCULAR | Status: DC
  Administered 2011-03-27: 200 mg via INTRAMUSCULAR

## 2011-03-27 NOTE — Patient Instructions (Addendum)
Titrate night insulin up 2 units every 3 days until fasting sugars are consistently around 130 or less. STOP metformin. Continue weight loss

## 2011-03-27 NOTE — Progress Notes (Signed)
Subjective:    Patient ID: Gary Yates, male    DOB: 1933-12-01, 78 y.o.   MRN: [id number]  HPI  Patient seen for medical followup. He has several issues discussed. First he has diagnosis of COPD.  This was back from 2009. Chronic cough and referred to pulmonary. He's not sure how diagnosis was established. He has applied for additional insurance and had issues because of the diagnosis of COPD. He is an ex-smoker. Quit about 30 years ago. Less than 20-pack-year history.  Type 2 diabetes. Poor control. A1c 9.1% last check. Last creatinine 1.6 and remains on metformin. Fasting blood sugars usually around 150-160.  History of low testosterone. He felt much better with twice monthly injections. More energy. Requests going back up  Hyperlipidemia. Recently initiated Lipitor 10 mg daily. Tolerating well. Lipids reviewed with patient and improved with regard to total cholesterol and LDL  Past Medical History  Diagnosis Date  . Coronary artery disease     Overlapping stents RCA. Last cath May 2008 with moderate disease LAD  . Hypertension   . Osteoarthritis     shoulder  . Restless leg   . Elevated lipids   . COPD (chronic obstructive pulmonary disease)   . Hyperlipidemia   . Nephrolithiasis   . SVT (supraventricular tachycardia)   . Dyspnea   . GERD (gastroesophageal reflux disease)   . DM (diabetes mellitus)     Type 2   Past Surgical History  Procedure Date  . Cholecystectomy     reports that he quit smoking about 27 years ago. His smoking use included Cigarettes. He has a 30 pack-year smoking history. He has never used smokeless tobacco. He reports that he does not drink alcohol or use illicit drugs. family history includes Alzheimer's disease in his mother; Arthritis in his brother and sister; Coronary artery disease in some unspecified family members; Heart disease in his brother, father, mother, and sister; Migraines in his daughter and father; Obesity in his sister and son;  Sleep apnea in his son; Thyroid disease in his daughter; and Ulcers in his father. Allergies  Allergen Reactions  . Ace Inhibitors     REACTION: cough  . Codeine     REACTION: Itch  . Penicillins       Review of Systems  Constitutional: Positive for fatigue.  Eyes: Negative for visual disturbance.  Respiratory: Negative for cough, chest tightness and shortness of breath.   Cardiovascular: Positive for leg swelling. Negative for chest pain and palpitations.  Neurological: Negative for dizziness, syncope, weakness, light-headedness and headaches.       Objective:   Physical Exam  Constitutional: He is oriented to person, place, and time. He appears well-developed and well-nourished.  Neck: Neck supple. No thyromegaly present.  Cardiovascular: Normal rate and regular rhythm.   Pulmonary/Chest: Effort normal and breath sounds normal. No respiratory distress. He has no wheezes. He has no rales.  Musculoskeletal:       No pitting edema  Neurological: He is alert and oriented to person, place, and time. No cranial nerve deficit.  Psychiatric: He has a normal mood and affect. His behavior is normal.          Assessment & Plan:  #1 dyspnea occasional with activity. Question of COPD. Repeat spirometry to confirm #2 type 2 diabetes poorly controlled. Recent creatinine 1.6. Discontinue metformin. Start Lantus 10 units daily and titrate slowly and reviewed with patient. #3 low testosterone. We'll go back to twice monthly injections #4 hyperlipidemia. Improved on Lipitor  Spirometry results ?valid.  Pt coughed with expiration on multiple attempts.  Will try to locate results from 2009.

## 2011-03-29 ENCOUNTER — Ambulatory Visit: Payer: Medicare Other | Admitting: Family Medicine

## 2011-04-10 ENCOUNTER — Ambulatory Visit (INDEPENDENT_AMBULATORY_CARE_PROVIDER_SITE_OTHER): Payer: Medicare Other | Admitting: Family Medicine

## 2011-04-10 DIAGNOSIS — E349 Endocrine disorder, unspecified: Secondary | ICD-10-CM

## 2011-04-10 DIAGNOSIS — E291 Testicular hypofunction: Secondary | ICD-10-CM

## 2011-04-10 MED ORDER — TESTOSTERONE CYPIONATE 200 MG/ML IM SOLN
200.0000 mg | INTRAMUSCULAR | Status: DC
  Administered 2011-04-10: 200 mg via INTRAMUSCULAR

## 2011-04-24 ENCOUNTER — Ambulatory Visit: Payer: 59 | Admitting: Family Medicine

## 2011-04-26 ENCOUNTER — Ambulatory Visit: Payer: 59 | Admitting: Family Medicine

## 2011-04-30 ENCOUNTER — Ambulatory Visit (INDEPENDENT_AMBULATORY_CARE_PROVIDER_SITE_OTHER): Payer: 59 | Admitting: *Deleted

## 2011-04-30 DIAGNOSIS — E291 Testicular hypofunction: Secondary | ICD-10-CM

## 2011-04-30 DIAGNOSIS — E349 Endocrine disorder, unspecified: Secondary | ICD-10-CM

## 2011-04-30 MED ORDER — TESTOSTERONE CYPIONATE 200 MG/ML IM SOLN: 200.0000 mg | Freq: Once | INTRAMUSCULAR | Status: DC

## 2011-05-09 ENCOUNTER — Encounter: Payer: Self-pay | Admitting: Family Medicine

## 2011-05-15 ENCOUNTER — Other Ambulatory Visit: Payer: Self-pay | Admitting: Family Medicine

## 2011-05-21 ENCOUNTER — Other Ambulatory Visit (INDEPENDENT_AMBULATORY_CARE_PROVIDER_SITE_OTHER): Payer: Medicare Other

## 2011-05-21 DIAGNOSIS — E349 Endocrine disorder, unspecified: Secondary | ICD-10-CM

## 2011-05-21 DIAGNOSIS — IMO0001 Reserved for inherently not codable concepts without codable children: Secondary | ICD-10-CM

## 2011-05-21 DIAGNOSIS — E291 Testicular hypofunction: Secondary | ICD-10-CM

## 2011-05-21 LAB — HEMOGLOBIN A1C: Hgb A1c MFr Bld: 8.5 % — ABNORMAL HIGH (ref 4.6–6.5)

## 2011-05-21 MED ORDER — TESTOSTERONE CYPIONATE 200 MG/ML IM SOLN
200.0000 mg | Freq: Once | INTRAMUSCULAR | Status: AC
  Administered 2011-05-21: 200 mg via INTRAMUSCULAR

## 2011-05-22 LAB — TESTOSTERONE: Testosterone: 151.91 ng/dL — ABNORMAL LOW (ref 350.00–890.00)

## 2011-05-23 NOTE — Progress Notes (Signed)
Quick Note:  Pt informed. He has been getting injections every 2 weeks since 4/3. Prior to that is was monthly. ______

## 2011-05-24 ENCOUNTER — Other Ambulatory Visit: Payer: Self-pay | Admitting: *Deleted

## 2011-05-24 DIAGNOSIS — E349 Endocrine disorder, unspecified: Secondary | ICD-10-CM

## 2011-05-24 NOTE — Progress Notes (Signed)
Quick Note:  Will mail labs to pt home with instructions highlighted ______

## 2011-05-28 ENCOUNTER — Ambulatory Visit: Payer: 59 | Admitting: Family Medicine

## 2011-06-04 ENCOUNTER — Ambulatory Visit (INDEPENDENT_AMBULATORY_CARE_PROVIDER_SITE_OTHER): Payer: Medicare Other | Admitting: Family Medicine

## 2011-06-04 ENCOUNTER — Encounter: Payer: Self-pay | Admitting: Family Medicine

## 2011-06-04 VITALS — BP 124/80 | HR 88 | Temp 98.2°F | Resp 20 | Wt 277.0 lb

## 2011-06-04 DIAGNOSIS — E119 Type 2 diabetes mellitus without complications: Secondary | ICD-10-CM

## 2011-06-04 DIAGNOSIS — J449 Chronic obstructive pulmonary disease, unspecified: Secondary | ICD-10-CM

## 2011-06-04 DIAGNOSIS — N189 Chronic kidney disease, unspecified: Secondary | ICD-10-CM

## 2011-06-04 DIAGNOSIS — R0609 Other forms of dyspnea: Secondary | ICD-10-CM

## 2011-06-04 DIAGNOSIS — E349 Endocrine disorder, unspecified: Secondary | ICD-10-CM

## 2011-06-04 DIAGNOSIS — E785 Hyperlipidemia, unspecified: Secondary | ICD-10-CM

## 2011-06-04 DIAGNOSIS — L989 Disorder of the skin and subcutaneous tissue, unspecified: Secondary | ICD-10-CM

## 2011-06-04 DIAGNOSIS — R0600 Dyspnea, unspecified: Secondary | ICD-10-CM

## 2011-06-04 DIAGNOSIS — R0989 Other specified symptoms and signs involving the circulatory and respiratory systems: Secondary | ICD-10-CM

## 2011-06-04 DIAGNOSIS — E669 Obesity, unspecified: Secondary | ICD-10-CM

## 2011-06-04 DIAGNOSIS — E291 Testicular hypofunction: Secondary | ICD-10-CM

## 2011-06-04 LAB — BASIC METABOLIC PANEL
Chloride: 101 mEq/L (ref 96–112)
Creatinine, Ser: 1.7 mg/dL — ABNORMAL HIGH (ref 0.4–1.5)
Potassium: 4.3 mEq/L (ref 3.5–5.1)

## 2011-06-04 MED ORDER — TESTOSTERONE CYPIONATE 200 MG/ML IM SOLN
200.0000 mg | Freq: Once | INTRAMUSCULAR | Status: AC
  Administered 2011-06-04: 200 mg via INTRAMUSCULAR

## 2011-06-04 MED ORDER — TIOTROPIUM BROMIDE MONOHYDRATE 18 MCG IN CAPS: 18.0000 ug | ORAL_CAPSULE | Freq: Every day | RESPIRATORY_TRACT | Status: DC

## 2011-06-04 NOTE — Progress Notes (Signed)
Subjective:    Patient ID: Gary Yates, male    DOB: 16-Aug-1933, 78 y.o.   MRN: [id number]  HPI  Patient has multiple problems including history obesity, type 2 diabetes, CAD, peripheral vascular disease, hypertension, dyslipidemia, chronic kidney disease, GERD, hypoaldosteronism, and chronic osteoarthritis. Seen today for medical followup. He had some recent problems of decreased stamina and increased dyspnea with activity but no chest pain. Known history of CAD. He relates most recent stress testing about 2 years ago. He has question of COPD and by recent spirometry confirmed obstructive airway disease. Previously had been on Spiriva and this seemed to help but for some reason he stopped taking this.  Nonsmoker at this point in time.  He has type 2 diabetes. He has history of peripheral neuropathy with frequent dysesthesias feet and legs especially at night. He currently takes gabapentin 300 mg 2 in the morning 2 at night as well as amitriptyline with breakthrough symptoms. Blood sugars have been better controlled on metformin but we had to discontinue because of creatinine over 1.5. Currently treated with Amaryl and Lantus insulin. No hypoglycemia.  Poor sleep quality. Frequent nocturia. No obstructive symptoms during day. Occasional slow stream at night. No caffeine use. No alcohol use.  Right cheek ulcerative lesion for at least 6-8 weeks nonhealing.  Past hx of basal cell cancer.f  Hypogonadism on q 2 week testosterone injections 200mg . Still complains of fatigue and recent blood level of testosterone still low.  Past Medical History  Diagnosis Date  . Coronary artery disease     Overlapping stents RCA. Last cath May 2008 with moderate disease LAD  . Hypertension   . Osteoarthritis     shoulder  . Restless leg   . Elevated lipids   . COPD (chronic obstructive pulmonary disease)   . Hyperlipidemia   . Nephrolithiasis   . SVT (supraventricular tachycardia)   . Dyspnea   . GERD  (gastroesophageal reflux disease)   . DM (diabetes mellitus)     Type 2   Past Surgical History  Procedure Date  . Cholecystectomy     reports that he quit smoking about 28 years ago. His smoking use included Cigarettes. He has a 30 pack-year smoking history. He has never used smokeless tobacco. He reports that he does not drink alcohol or use illicit drugs. family history includes Alzheimer's disease in his mother; Arthritis in his brother and sister; Coronary artery disease in some unspecified family members; Heart disease in his brother, father, mother, and sister; Migraines in his daughter and father; Obesity in his sister and son; Sleep apnea in his son; Thyroid disease in his daughter; and Ulcers in his father. Allergies  Allergen Reactions  . Ace Inhibitors     REACTION: cough  . Codeine     REACTION: Itch  . Penicillins       Review of Systems  Constitutional: Positive for fatigue. Negative for activity change, appetite change and unexpected weight change.  HENT: Negative for trouble swallowing.   Eyes: Negative for visual disturbance.  Respiratory: Positive for shortness of breath. Negative for cough.   Cardiovascular: Negative for chest pain, palpitations and leg swelling.  Gastrointestinal: Negative for nausea, vomiting and abdominal pain.  Genitourinary: Negative for dysuria.  Skin: Positive for wound.  Neurological: Negative for dizziness, syncope and numbness.  Hematological: Negative for adenopathy. Does not bruise/bleed easily.  Psychiatric/Behavioral: Negative for dysphoric mood.       Objective:   Physical Exam  Constitutional: He is oriented to person,  place, and time. He appears well-developed and well-nourished.  HENT:  Right Ear: External ear normal.  Left Ear: External ear normal.  Mouth/Throat: Oropharynx is clear and moist.  Neck: Neck supple. No thyromegaly present.  Cardiovascular: Normal rate and regular rhythm.   Pulmonary/Chest: Effort normal  and breath sounds normal. No respiratory distress. He has no wheezes. He has no rales.  Musculoskeletal: He exhibits no edema.  Lymphadenopathy:    He has no cervical adenopathy.  Neurological: He is alert and oriented to person, place, and time. He has normal reflexes. No cranial nerve deficit.  Skin:       R check small approx 6-7 mm superficial ulcerative lesion.  No abnormal pigment change.  Psychiatric: He has a normal mood and affect. His behavior is normal.          Assessment & Plan:  #1 dyspnea. Probably related to chronic lung disease. Difficult to sort out with his hx of CAD. Reviewed results of recent spirometry with patient. Get back on Spiriva one inhalation daily with samples given and script. #2 history of CAD. Recent increased dyspnea with activity and decreased stamina-but no chest pain. Question related to #1. Get back in to see cardiologist soon. #3 history of diabetic peripheral neuropathy. Increase gabapentin 300 mg 2 in the morning 2 at night and add a third dose midday of 300 mg. #4 type 2 diabetes. History of marginal control. Recheck basic metabolic panel. If creatinine 1.5 or less consider starting back metformin. Otherwise titrate insulin. #5 nonhealing ulcerative lesion right cheek over the past couple months. Needs to be biopsied. We'll get back in to see his dermatologist.  He has hx of basal cell ca and DDx is squamous cell vs basal cell cancer. #6 chronic insomnia. Possible BPH symptoms. Discussed alternatives such as Flomax at this point and he wishes to observe #7 hypogonadism.  Pt received testosterone injection today.

## 2011-06-04 NOTE — Patient Instructions (Signed)
Increase gabapentin by adding third dose at mid day. Spiriva is one puff once daily.

## 2011-06-10 ENCOUNTER — Telehealth: Payer: Self-pay

## 2011-06-10 NOTE — Telephone Encounter (Signed)
Pt calling because he needs a new rx for his gabapentin sent in to [facility].  His dose was recently increased so the higher quantity needs to be sent to the pharmacy.  He said he is currently taking a total of 5 pills a day, before was only taking 3/day

## 2011-06-11 MED ORDER — GABAPENTIN 300 MG PO CAPS: ORAL_CAPSULE | ORAL | Status: DC

## 2011-06-18 ENCOUNTER — Encounter (HOSPITAL_COMMUNITY): Payer: Self-pay | Admitting: Cardiology

## 2011-06-18 ENCOUNTER — Inpatient Hospital Stay (HOSPITAL_COMMUNITY)
Admission: EM | Admit: 2011-06-18 | Discharge: 2011-06-21 | DRG: 287 | Disposition: A | Discharge status: Home / Self Care | Payer: Medicare Other | Attending: Cardiology | Admitting: Cardiology

## 2011-06-18 ENCOUNTER — Encounter (HOSPITAL_COMMUNITY)
Admission: EM | Disposition: A | Discharge status: Home / Self Care | Payer: Self-pay | Source: Home / Self Care | Attending: Cardiology

## 2011-06-18 ENCOUNTER — Ambulatory Visit: Payer: Medicare Other | Admitting: Family Medicine

## 2011-06-18 DIAGNOSIS — E1142 Type 2 diabetes mellitus with diabetic polyneuropathy: Secondary | ICD-10-CM | POA: Diagnosis present

## 2011-06-18 DIAGNOSIS — Z794 Long term (current) use of insulin: Secondary | ICD-10-CM

## 2011-06-18 DIAGNOSIS — N189 Chronic kidney disease, unspecified: Secondary | ICD-10-CM

## 2011-06-18 DIAGNOSIS — I129 Hypertensive chronic kidney disease with stage 1 through stage 4 chronic kidney disease, or unspecified chronic kidney disease: Secondary | ICD-10-CM | POA: Diagnosis present

## 2011-06-18 DIAGNOSIS — Z8249 Family history of ischemic heart disease and other diseases of the circulatory system: Secondary | ICD-10-CM

## 2011-06-18 DIAGNOSIS — M19019 Primary osteoarthritis, unspecified shoulder: Secondary | ICD-10-CM | POA: Diagnosis present

## 2011-06-18 DIAGNOSIS — R072 Precordial pain: Principal | ICD-10-CM | POA: Diagnosis present

## 2011-06-18 DIAGNOSIS — Z79899 Other long term (current) drug therapy: Secondary | ICD-10-CM

## 2011-06-18 DIAGNOSIS — I10 Essential (primary) hypertension: Secondary | ICD-10-CM

## 2011-06-18 DIAGNOSIS — E669 Obesity, unspecified: Secondary | ICD-10-CM | POA: Diagnosis present

## 2011-06-18 DIAGNOSIS — J4489 Other specified chronic obstructive pulmonary disease: Secondary | ICD-10-CM | POA: Diagnosis present

## 2011-06-18 DIAGNOSIS — I2510 Atherosclerotic heart disease of native coronary artery without angina pectoris: Secondary | ICD-10-CM

## 2011-06-18 DIAGNOSIS — R29898 Other symptoms and signs involving the musculoskeletal system: Secondary | ICD-10-CM

## 2011-06-18 DIAGNOSIS — E782 Mixed hyperlipidemia: Secondary | ICD-10-CM | POA: Diagnosis present

## 2011-06-18 DIAGNOSIS — K219 Gastro-esophageal reflux disease without esophagitis: Secondary | ICD-10-CM | POA: Diagnosis present

## 2011-06-18 DIAGNOSIS — E785 Hyperlipidemia, unspecified: Secondary | ICD-10-CM | POA: Diagnosis present

## 2011-06-18 DIAGNOSIS — J449 Chronic obstructive pulmonary disease, unspecified: Secondary | ICD-10-CM | POA: Diagnosis present

## 2011-06-18 DIAGNOSIS — G2581 Restless legs syndrome: Secondary | ICD-10-CM | POA: Diagnosis present

## 2011-06-18 DIAGNOSIS — I4510 Unspecified right bundle-branch block: Secondary | ICD-10-CM | POA: Diagnosis present

## 2011-06-18 DIAGNOSIS — G629 Polyneuropathy, unspecified: Secondary | ICD-10-CM

## 2011-06-18 DIAGNOSIS — I471 Supraventricular tachycardia: Secondary | ICD-10-CM

## 2011-06-18 DIAGNOSIS — E119 Type 2 diabetes mellitus without complications: Secondary | ICD-10-CM

## 2011-06-18 DIAGNOSIS — R0789 Other chest pain: Secondary | ICD-10-CM

## 2011-06-18 DIAGNOSIS — I498 Other specified cardiac arrhythmias: Secondary | ICD-10-CM | POA: Diagnosis present

## 2011-06-18 DIAGNOSIS — E1149 Type 2 diabetes mellitus with other diabetic neurological complication: Secondary | ICD-10-CM | POA: Diagnosis present

## 2011-06-18 DIAGNOSIS — N182 Chronic kidney disease, stage 2 (mild): Secondary | ICD-10-CM | POA: Diagnosis present

## 2011-06-18 DIAGNOSIS — I480 Paroxysmal atrial fibrillation: Secondary | ICD-10-CM

## 2011-06-18 DIAGNOSIS — Z9861 Coronary angioplasty status: Secondary | ICD-10-CM

## 2011-06-18 DIAGNOSIS — Z7982 Long term (current) use of aspirin: Secondary | ICD-10-CM

## 2011-06-18 DIAGNOSIS — I4891 Unspecified atrial fibrillation: Secondary | ICD-10-CM | POA: Diagnosis not present

## 2011-06-18 HISTORY — PX: LEFT HEART CATHETERIZATION WITH CORONARY ANGIOGRAM: SHX5451

## 2011-06-18 HISTORY — DX: Type 2 diabetes mellitus with diabetic polyneuropathy: E11.42

## 2011-06-18 LAB — CBC
Platelets: 186 10*3/uL (ref 150–400)
RBC: 5.01 MIL/uL (ref 4.22–5.81)
WBC: 7.5 10*3/uL (ref 4.0–10.5)

## 2011-06-18 LAB — D-DIMER, QUANTITATIVE: D-Dimer, Quant: 0.47 ug/mL-FEU (ref 0.00–0.48)

## 2011-06-18 LAB — POCT I-STAT, CHEM 8
BUN: 20 mg/dL (ref 6–23)
Creatinine, Ser: 1.6 mg/dL — ABNORMAL HIGH (ref 0.50–1.35)
Sodium: 139 mEq/L (ref 135–145)
TCO2: 22 mmol/L (ref 0–100)

## 2011-06-18 LAB — COMPREHENSIVE METABOLIC PANEL
ALT: 13 U/L (ref 0–53)
AST: 34 U/L (ref 0–37)
CO2: 21 mEq/L (ref 19–32)
Chloride: 102 mEq/L (ref 96–112)
GFR calc non Af Amer: 41 mL/min — ABNORMAL LOW (ref >90)
Sodium: 136 mEq/L (ref 135–145)
Total Bilirubin: 0.3 mg/dL (ref 0.3–1.2)

## 2011-06-18 LAB — CARDIAC PANEL(CRET KIN+CKTOT+MB+TROPI)
Relative Index: 2.2 (ref 0.0–2.5)
Total CK: 209 U/L (ref 7–232)

## 2011-06-18 LAB — POCT I-STAT TROPONIN I: Troponin i, poc: 0 ng/mL (ref 0.00–0.08)

## 2011-06-18 LAB — GLUCOSE, CAPILLARY

## 2011-06-18 SURGERY — LEFT HEART CATHETERIZATION WITH CORONARY ANGIOGRAM
Anesthesia: LOCAL

## 2011-06-18 MED ORDER — ACETAMINOPHEN 325 MG PO TABS
650.0000 mg | ORAL_TABLET | ORAL | Status: DC | PRN
  Administered 2011-06-19: 650 mg via ORAL
  Filled 2011-06-18: qty 2

## 2011-06-18 MED ORDER — GLIMEPIRIDE 4 MG PO TABS
4.0000 mg | ORAL_TABLET | Freq: Every day | ORAL | Status: DC
Start: 2011-06-19 — End: 2011-06-21
  Administered 2011-06-19 – 2011-06-21 (×3): 4 mg via ORAL
  Filled 2011-06-18 (×4): qty 1

## 2011-06-18 MED ORDER — LISINOPRIL 5 MG PO TABS
5.0000 mg | ORAL_TABLET | Freq: Every day | ORAL | Status: DC
  Administered 2011-06-19 – 2011-06-21 (×3): 5 mg via ORAL
  Filled 2011-06-18 (×3): qty 1

## 2011-06-18 MED ORDER — ONDANSETRON HCL 4 MG/2ML IJ SOLN: 4.0000 mg | Freq: Four times a day (QID) | INTRAMUSCULAR | Status: DC | PRN

## 2011-06-18 MED ORDER — LIDOCAINE HCL (PF) 1 % IJ SOLN
INTRAMUSCULAR | Status: AC
  Filled 2011-06-18: qty 30

## 2011-06-18 MED ORDER — HEPARIN SODIUM (PORCINE) 1000 UNIT/ML IJ SOLN
5000.0000 [IU] | Freq: Once | INTRAMUSCULAR | Status: AC
  Administered 2011-06-18: 5000 [IU] via INTRAVENOUS

## 2011-06-18 MED ORDER — TIOTROPIUM BROMIDE MONOHYDRATE 18 MCG IN CAPS
18.0000 ug | ORAL_CAPSULE | Freq: Every day | RESPIRATORY_TRACT | Status: DC
  Administered 2011-06-19 – 2011-06-21 (×3): 18 ug via RESPIRATORY_TRACT
  Filled 2011-06-18: qty 5

## 2011-06-18 MED ORDER — ASPIRIN 81 MG PO CHEW: 324.0000 mg | CHEWABLE_TABLET | Freq: Once | ORAL | Status: DC

## 2011-06-18 MED ORDER — HEPARIN SODIUM (PORCINE) 1000 UNIT/ML IJ SOLN
INTRAMUSCULAR | Status: AC
  Filled 2011-06-18: qty 1

## 2011-06-18 MED ORDER — HEPARIN (PORCINE) IN NACL 2-0.9 UNIT/ML-% IJ SOLN
INTRAMUSCULAR | Status: AC
  Filled 2011-06-18: qty 1000

## 2011-06-18 MED ORDER — ASPIRIN EC 81 MG PO TBEC
81.0000 mg | DELAYED_RELEASE_TABLET | Freq: Every day | ORAL | Status: DC
  Administered 2011-06-19 – 2011-06-20 (×2): 81 mg via ORAL
  Filled 2011-06-18 (×2): qty 1

## 2011-06-18 MED ORDER — SODIUM CHLORIDE 0.9 % IV SOLN
INTRAVENOUS | Status: DC
  Administered 2011-06-18: 20 mL via INTRAVENOUS

## 2011-06-18 MED ORDER — VERAPAMIL HCL 2.5 MG/ML IV SOLN
INTRAVENOUS | Status: AC
  Filled 2011-06-18: qty 2

## 2011-06-18 MED ORDER — METOPROLOL TARTRATE 1 MG/ML IV SOLN
2.5000 mg | Freq: Once | INTRAVENOUS | Status: AC
  Administered 2011-06-18: 2.5 mg via INTRAVENOUS

## 2011-06-18 MED ORDER — NITROGLYCERIN 0.2 MG/ML ON CALL CATH LAB
INTRAVENOUS | Status: AC
  Filled 2011-06-18: qty 1

## 2011-06-18 MED ORDER — INSULIN GLARGINE 100 UNIT/ML ~~LOC~~ SOLN
44.0000 [IU] | Freq: Two times a day (BID) | SUBCUTANEOUS | Status: DC
  Administered 2011-06-18 – 2011-06-19 (×3): 44 [IU] via SUBCUTANEOUS
  Administered 2011-06-20: 22:00:00 via SUBCUTANEOUS
  Administered 2011-06-20 – 2011-06-21 (×2): 44 [IU] via SUBCUTANEOUS

## 2011-06-18 MED ORDER — PANTOPRAZOLE SODIUM 40 MG PO TBEC
40.0000 mg | DELAYED_RELEASE_TABLET | Freq: Every day | ORAL | Status: DC
  Administered 2011-06-18 – 2011-06-21 (×4): 40 mg via ORAL
  Filled 2011-06-18 (×4): qty 1

## 2011-06-18 MED ORDER — AMITRIPTYLINE HCL 10 MG PO TABS
20.0000 mg | ORAL_TABLET | Freq: Every day | ORAL | Status: DC
  Administered 2011-06-18 – 2011-06-20 (×3): 20 mg via ORAL
  Filled 2011-06-18 (×4): qty 2

## 2011-06-18 MED ORDER — FENOFIBRATE 54 MG PO TABS
54.0000 mg | ORAL_TABLET | Freq: Every day | ORAL | Status: DC
  Administered 2011-06-19 – 2011-06-21 (×3): 54 mg via ORAL
  Filled 2011-06-18 (×3): qty 1

## 2011-06-18 MED ORDER — GABAPENTIN 300 MG PO CAPS
600.0000 mg | ORAL_CAPSULE | Freq: Two times a day (BID) | ORAL | Status: DC
  Administered 2011-06-18 – 2011-06-21 (×6): 600 mg via ORAL
  Filled 2011-06-18 (×7): qty 2

## 2011-06-18 MED ORDER — HEPARIN (PORCINE) IN NACL 100-0.45 UNIT/ML-% IJ SOLN
1300.0000 [IU]/h | INTRAMUSCULAR | Status: DC
  Administered 2011-06-19: 1300 [IU]/h via INTRAVENOUS
  Filled 2011-06-18 (×2): qty 250

## 2011-06-18 MED ORDER — NITROGLYCERIN 0.4 MG SL SUBL: 0.4000 mg | SUBLINGUAL_TABLET | SUBLINGUAL | Status: DC | PRN

## 2011-06-18 MED ORDER — ACETAMINOPHEN 325 MG PO TABS: 650.0000 mg | ORAL_TABLET | ORAL | Status: DC | PRN

## 2011-06-18 MED ORDER — GABAPENTIN 300 MG PO CAPS
300.0000 mg | ORAL_CAPSULE | ORAL | Status: DC
  Administered 2011-06-19 – 2011-06-21 (×3): 300 mg via ORAL
  Filled 2011-06-18 (×3): qty 1

## 2011-06-18 MED ORDER — METOPROLOL SUCCINATE ER 50 MG PO TB24
50.0000 mg | ORAL_TABLET | Freq: Every day | ORAL | Status: DC
  Administered 2011-06-19 – 2011-06-21 (×3): 50 mg via ORAL
  Filled 2011-06-18 (×3): qty 1

## 2011-06-18 MED ORDER — ASPIRIN 81 MG PO TABS: 81.0000 mg | ORAL_TABLET | Freq: Every day | ORAL | Status: DC

## 2011-06-18 MED ORDER — SODIUM CHLORIDE 0.9 % IV SOLN
1.0000 mL/kg/h | INTRAVENOUS | Status: AC
  Administered 2011-06-19: 1 mL/kg/h via INTRAVENOUS

## 2011-06-18 MED ORDER — METOPROLOL TARTRATE 1 MG/ML IV SOLN
INTRAVENOUS | Status: AC
  Administered 2011-06-18: 2.5 mg via INTRAVENOUS
  Filled 2011-06-18: qty 5

## 2011-06-18 MED ORDER — ATORVASTATIN CALCIUM 10 MG PO TABS
10.0000 mg | ORAL_TABLET | Freq: Every day | ORAL | Status: DC
Start: 2011-06-19 — End: 2011-06-21
  Administered 2011-06-19 – 2011-06-21 (×3): 10 mg via ORAL
  Filled 2011-06-18 (×3): qty 1

## 2011-06-18 MED ORDER — HEPARIN SODIUM (PORCINE) 5000 UNIT/ML IJ SOLN: 60.0000 [IU]/kg | INTRAMUSCULAR | Status: DC

## 2011-06-18 MED ORDER — FENTANYL CITRATE 0.05 MG/ML IJ SOLN
INTRAMUSCULAR | Status: AC
  Filled 2011-06-18: qty 2

## 2011-06-18 MED ORDER — ADENOSINE 6 MG/2ML IV SOLN
INTRAVENOUS | Status: AC
  Administered 2011-06-18: 20:00:00
  Filled 2011-06-18: qty 4

## 2011-06-18 MED ORDER — MIDAZOLAM HCL 2 MG/2ML IJ SOLN
INTRAMUSCULAR | Status: AC
  Filled 2011-06-18: qty 2

## 2011-06-18 NOTE — ED Notes (Signed)
Per EMS- pt c/o SOB along with palpatations and chest tightness. Pt HR is 190s, a. Fib with RVR. EMS started IV in Right AC, gave 2 nitro, 4 ASA and 12 of adenosine . HR decreased after adenosine was administered.CBG 270.

## 2011-06-18 NOTE — CV Procedure (Signed)
   Cardiac Catheterization Procedure Note  Name: Gary Yates MRN: [id number] DOB: 02-Nov-1933  Procedure: Left Heart Cath, Selective Coronary Angiography, LV angiography  Indication: 78 year old white male with history of coronary disease status post stenting of the right coronary in 2004. He presents with symptoms of refractory chest pain. His ECG shows a wide a right bundle branch block. He was brought emergently to the cardiac catheterization laboratory.   Procedural details: We initially gained access via the right radial artery using standard Seldinger technique and a 6 French sheath was placed. However in an attempt to pass the catheter into the descending aorta the patient was found to have arterial lusoria. Using 2 different catheters we were unable to get the wire to track into the ascending aorta. Rather than spending a great deal of time trying to gain access we proceeded with femoral access. The right groin was prepped, draped, and anesthetized with 1% lidocaine. Using modified Seldinger technique, a 6 French sheath was introduced into the right femoral artery. Standard Judkins catheters were used for coronary angiography and left ventriculography. Catheter exchanges were performed over a guidewire. There were no immediate procedural complications. The patient was transferred to the post catheterization recovery area for further monitoring.  Procedural Findings: Hemodynamics:  AO 124/71 with a mean of 95 mmHg LV 126/16 mmHg   Coronary angiography: Coronary dominance: right  Left mainstem: The left main coronary was normal.  Left anterior descending (LAD): The left anterior descending artery starts as one main branch. It then trifurcates into a large septal branch, a large diagonal branch, and then the continuation of the LAD to the apex. There is then a second diagonal branch. At the trifurcation there appears to be 50% narrowing. In the mid LAD following the second diagonal there is  a 70% stenosis. The diagonal branches have nonobstructive disease less than 30%.  Left circumflex (LCx): The left circumflex gives rise to a single marginal branch. There is diffuse 30% disease in the proximal to mid vessel.  Right coronary artery (RCA): The right coronary is a dominant vessel. It is stented in the proximal to mid vessel. The stent is widely patent with diffuse 20% in-stent stenosis.  Left ventriculography: Left ventricular systolic function is normal, LVEF is estimated at 55-65%, there is no significant mitral regurgitation   Final Conclusions:   1. Single vessel obstructive coronary disease with a modest stenosis in the mid LAD. The stents in the right coronary widely patent. I do not see any significant lesions that would explain his ongoing resting chest pain. 2. Normal left ventricular function.  Recommendations: We will continue aggressive medical therapy at this point and cycle cardiac enzymes and ECG. We'll also check a d-dimer. Consider alternative causes for his acute chest pain.  Collier Salina [facility] 06/18/2011, 9:44 PM

## 2011-06-18 NOTE — H&P (Signed)
Admit date: 06/18/2011 Referring Physician  Dr. Audie Pinto Primary Cardiologist  Dr. Julianne Handler Chief complaint/reason for admission:  Chest pain  HPI: This is a 77yo WM with a history of CAD with overlapping stents in the RCA in 2004 and repeat cath in 2008 with patent RCA stents and moderate LAD disease.  He was in his USOH until around 6pm when he was at his grandson's baseball game and developed chest pressure and severe diaphoresis.  He went to the car to get some air but continued to have pain.  He called EMS and was given SL NTG x2 en route with only mild improvement.  He was noted to be in SVT with RBBB and was given Adenosine 6mg  x2 with no improvement.  He currently complains of SOB, diaphoresis and 6/10 chest pain.    PMH:    Past Medical History  Diagnosis Date  . Coronary artery disease     Overlapping stents RCA 2004. Last cath May 2008 with moderate disease LAD and patent RCA stents   . Hypertension   . Osteoarthritis     shoulder  . Restless leg   . Elevated lipids   . COPD (chronic obstructive pulmonary disease)   . Hyperlipidemia   . Nephrolithiasis   . SVT (supraventricular tachycardia)   . Dyspnea   . GERD (gastroesophageal reflux disease)   . DM (diabetes mellitus)     Type 2, peripheral neuropathy.    PSH:    Past Surgical History  Procedure Date  . Cholecystectomy     ALLERGIES:   Ace inhibitors; Codeine; and Penicillins  Prior to Admit Meds:   (Not in a hospital admission) Family HX:    Family History  Problem Relation Age of Onset  . Coronary artery disease      Male 1st degree relative <50  . Coronary artery disease      male 1st degree relative <60  . Heart disease Father   . Migraines Father   . Ulcers Father   . Alzheimer's disease Mother   . Heart disease Mother   . Heart disease Sister   . Obesity Sister     Morbid  . Arthritis Sister   . Heart disease Brother   . Arthritis Brother   . Sleep apnea Son   . Obesity Son   . Migraines  Daughter   . Thyroid disease Daughter    Social HX:    History   Social History  . Marital Status: Married    Spouse Name: N/A    Number of Children: 3  . Years of Education: N/A   Occupational History  . Retired    Social History Main Topics  . Smoking status: Former Smoker -- 1.5 packs/day for 20 years    Types: Cigarettes    Quit date: 04/05/1983  . Smokeless tobacco: Never Used  . Alcohol Use: No  . Drug Use: No  . Sexually Active: Not on file   Other Topics Concern  . Not on file   Social History Narrative  . No narrative on file     ROS:  All 11 ROS were addressed and are negative except what is stated in the HPI  PHYSICAL EXAM Filed Vitals:   06/18/11 2036  BP: 98/68  Pulse:   Temp:   Resp:    General: Well developed, well nourished, in no acute distress Head: Eyes PERRLA, No xanthomas.   Normal cephalic and atramatic  Lungs:   Clear bilaterally to auscultation and  percussion. Heart:   HRRR, tachycardic S1 S2 Pulses are 2+ & equal.            No carotid bruit. No JVD.  No abdominal bruits. No femoral bruits. Abdomen: Bowel sounds are positive, abdomen soft and non-tender without masses or                  Hernia's noted. Msk:  Back normal, normal gait. Normal strength and tone for age. Extremities:   No clubbing, cyanosis or edema.  DP +1 Neuro: Alert and oriented X 3. Psych:  Good affect, responds appropriately   Labs:   Lab Results  Component Value Date   WBC 7.5 06/18/2011   HGB 16.0 06/18/2011   HGB 15.1 06/18/2011   HCT 47.0 06/18/2011   HCT 44.6 06/18/2011   MCV 89.0 06/18/2011   PLT 186 06/18/2011    Lab 06/18/11 2025  NA 139  K 4.2  CL 105  CO2 --  BUN 20  CREATININE 1.60*  CALCIUM --  PROT --  BILITOT --  ALKPHOS --  ALT --  AST --  GLUCOSE 267*   Lab Results  Component Value Date   CKTOTAL 109 07/31/2007   No results found for this basename: PTT   Lab Results  Component Value Date   INR 1.02 06/18/2011     Lab Results   Component Value Date   CHOL 205* 03/20/2011   CHOL 283* 01/16/2011   CHOL 252* 08/14/2009   Lab Results  Component Value Date   HDL 37.20* 03/20/2011   HDL 36.70* 01/16/2011   HDL 38.20* 08/14/2009   No results found for this basename: LDLCALC   Lab Results  Component Value Date   TRIG 539.0 Triglyceride is over 400; calculations on Lipids are invalid.* 03/20/2011   TRIG 574.0 Triglyceride is over 400; calculations on Lipids are invalid.* 01/16/2011   TRIG 328.0* 08/14/2009   Lab Results  Component Value Date   CHOLHDL 6 03/20/2011   CHOLHDL 8 01/16/2011   CHOLHDL 7 08/14/2009   Lab Results  Component Value Date   LDLDIRECT 83.4 03/20/2011   LDLDIRECT 141.1 01/16/2011   LDLDIRECT 167.1 08/14/2009      Radiology:  Pending  EKG:  Sinus tachycardia with RBBB  ASSESSMENT:  1.  Acute coronary syndrome with ongoing chest pain and new RBBB. 2.  CAD with history of RCA stent x 2 and moderate disease of LAD by cath in 2008 3.  New RBBB 4.  Hyperlipidemia with triglycerides of 500 in March 2013 5.  Obesity 6.  DM 7.  HTN 8.  COPD 9.  Mild renal insufficiency   PLAN:   1.  Due to ongoing chest pain despite NTG and new RBBB will take emergently to the cath lab for cardiac cath. 2.  Continue current meds 3.  Cycle cardiac enzymes 4.  Further orders per Dr. Martinique pending results of cath  Sueanne Margarita, MD  06/18/2011  8:51 PM

## 2011-06-18 NOTE — Progress Notes (Signed)
ANTICOAGULATION CONSULT NOTE - Initial Consult  Pharmacy Consult for: R/o ACS Indication: chest pain/ACS  Allergies  Allergen Reactions  . Ace Inhibitors     REACTION: cough  . Codeine     REACTION: Itch  . Penicillins     Patient Measurements: Height: 5\' 10"  (177.8 cm) Weight: 277 lb (125.646 kg) IBW/kg (Calculated) : 73  Heparin Dosing Weight: 101 kg  Vital Signs: Temp: 98.3 F (36.8 C) (06/11 2002) Temp src: Oral (06/11 2002) BP: 98/68 mmHg (06/11 2036) Pulse Rate: 117  (06/11 2058)  Labs:  Basename 06/18/11 2025  HGB 16.015.1  HCT 47.044.6  PLT 186  APTT 31  LABPROT 13.6  INR 1.02  HEPARINUNFRC --  CREATININE 1.60*1.56*  CKTOTAL --  CKMB --  TROPONINI --    Estimated Creatinine Clearance: 52.2 ml/min (by C-G formula based on Cr of 1.6).   Medical History: Past Medical History  Diagnosis Date  . Coronary artery disease     Overlapping stents RCA. Last cath May 2008 with moderate disease LAD  . Hypertension   . Osteoarthritis     shoulder  . Restless leg   . Elevated lipids   . COPD (chronic obstructive pulmonary disease)   . Hyperlipidemia   . Nephrolithiasis   . SVT (supraventricular tachycardia)   . Dyspnea   . GERD (gastroesophageal reflux disease)   . DM (diabetes mellitus)     Type 2, peripheral neuropathy.    Medications:  Scheduled:    . adenosine      . amitriptyline  20 mg Oral QHS  . aspirin EC  81 mg Oral Daily  . atorvastatin  10 mg Oral q1800  . fenofibrate  54 mg Oral Daily  . fentaNYL      . gabapentin  300 mg Oral Q24H  . gabapentin  600 mg Oral BID  . glimepiride  4 mg Oral QAC breakfast  . heparin      . heparin  5,000 Units Intravenous Once  . insulin glargine  44 Units Subcutaneous BID  . lidocaine      . lisinopril  5 mg Oral Daily  . metoprolol  2.5 mg Intravenous Once  . metoprolol succinate  50 mg Oral Daily  . midazolam      . nitroGLYCERIN      . pantoprazole  40 mg Oral Q1200  . tiotropium  18 mcg  Inhalation Daily  . verapamil      . DISCONTD: aspirin  324 mg Oral Once  . DISCONTD: aspirin  81 mg Oral Daily  . DISCONTD: heparin  60 Units/kg Intravenous STAT    Assessment: 78 yo male admitted with chest pain brought emergently to cardiac cath lab. Pharmacy consulted to manage IV heparin and start 6 hours post-sheath removal (sheath removed at ~21:30).   Goal of Therapy:  Heparin level 0.3-0.7 units/ml Monitor platelets by anticoagulation protocol: Yes   Plan:  1. Start IV heparin at 1300 units/hr at 03:30 AM on 06/19/11.  2. Heparin level 8 hours after start.  3. Daily CBC, heparin level  Otila Back 06/18/2011,10:47 PM

## 2011-06-18 NOTE — ED Notes (Signed)
Dr.Turner (cards) in to assess

## 2011-06-19 ENCOUNTER — Inpatient Hospital Stay (HOSPITAL_COMMUNITY): Payer: Medicare Other

## 2011-06-19 ENCOUNTER — Encounter (HOSPITAL_COMMUNITY): Payer: Self-pay | Admitting: Nurse Practitioner

## 2011-06-19 ENCOUNTER — Ambulatory Visit: Payer: Medicare Other | Admitting: Family Medicine

## 2011-06-19 DIAGNOSIS — I369 Nonrheumatic tricuspid valve disorder, unspecified: Secondary | ICD-10-CM

## 2011-06-19 DIAGNOSIS — I471 Supraventricular tachycardia: Secondary | ICD-10-CM

## 2011-06-19 DIAGNOSIS — I10 Essential (primary) hypertension: Secondary | ICD-10-CM | POA: Diagnosis present

## 2011-06-19 DIAGNOSIS — E785 Hyperlipidemia, unspecified: Secondary | ICD-10-CM | POA: Insufficient documentation

## 2011-06-19 DIAGNOSIS — R0789 Other chest pain: Secondary | ICD-10-CM

## 2011-06-19 DIAGNOSIS — G629 Polyneuropathy, unspecified: Secondary | ICD-10-CM | POA: Insufficient documentation

## 2011-06-19 DIAGNOSIS — I2510 Atherosclerotic heart disease of native coronary artery without angina pectoris: Secondary | ICD-10-CM | POA: Diagnosis present

## 2011-06-19 DIAGNOSIS — R079 Chest pain, unspecified: Secondary | ICD-10-CM

## 2011-06-19 DIAGNOSIS — J449 Chronic obstructive pulmonary disease, unspecified: Secondary | ICD-10-CM | POA: Diagnosis present

## 2011-06-19 DIAGNOSIS — K219 Gastro-esophageal reflux disease without esophagitis: Secondary | ICD-10-CM | POA: Insufficient documentation

## 2011-06-19 LAB — HEMOGLOBIN A1C: Mean Plasma Glucose: 217 mg/dL — ABNORMAL HIGH (ref <117)

## 2011-06-19 LAB — POCT ACTIVATED CLOTTING TIME: Activated Clotting Time: 155 seconds

## 2011-06-19 LAB — CARDIAC PANEL(CRET KIN+CKTOT+MB+TROPI)
CK, MB: 4.3 ng/mL — ABNORMAL HIGH (ref 0.3–4.0)
Relative Index: 2.3 (ref 0.0–2.5)
Relative Index: 2.4 (ref 0.0–2.5)
Total CK: 183 U/L (ref 7–232)
Total CK: 176 U/L (ref 7–232)
Troponin I: <0.3 ng/mL (ref <0.30)

## 2011-06-19 LAB — COMPREHENSIVE METABOLIC PANEL
ALT: 12 U/L (ref 0–53)
AST: 25 U/L (ref 0–37)
Alkaline Phosphatase: 56 U/L (ref 39–117)
CO2: 22 mEq/L (ref 19–32)
Calcium: 9 mg/dL (ref 8.4–10.5)
Chloride: 102 mEq/L (ref 96–112)
GFR calc Af Amer: 47 mL/min — ABNORMAL LOW (ref >90)
GFR calc non Af Amer: 41 mL/min — ABNORMAL LOW (ref >90)
Glucose, Bld: 206 mg/dL — ABNORMAL HIGH (ref 70–99)
Potassium: 4.3 mEq/L (ref 3.5–5.1)
Sodium: 136 mEq/L (ref 135–145)
Total Bilirubin: 0.3 mg/dL (ref 0.3–1.2)

## 2011-06-19 LAB — TSH: TSH: 3.132 u[IU]/mL (ref 0.350–4.500)

## 2011-06-19 LAB — DIFFERENTIAL
Eosinophils Absolute: 0.2 10*3/uL (ref 0.0–0.7)
Eosinophils Relative: 3 % (ref 0–5)
Lymphocytes Relative: 32 % (ref 12–46)
Lymphs Abs: 2.4 10*3/uL (ref 0.7–4.0)
Monocytes Relative: 12 % (ref 3–12)

## 2011-06-19 LAB — GLUCOSE, CAPILLARY: Glucose-Capillary: 122 mg/dL — ABNORMAL HIGH (ref 70–99)

## 2011-06-19 MED ORDER — MORPHINE SULFATE 2 MG/ML IJ SOLN
2.0000 mg | INTRAMUSCULAR | Status: DC | PRN
  Administered 2011-06-19 – 2011-06-20 (×4): 2 mg via INTRAVENOUS
  Filled 2011-06-19 (×3): qty 1

## 2011-06-19 MED ORDER — INSULIN ASPART 100 UNIT/ML ~~LOC~~ SOLN
0.0000 [IU] | Freq: Three times a day (TID) | SUBCUTANEOUS | Status: DC
  Administered 2011-06-19 – 2011-06-20 (×2): 2 [IU] via SUBCUTANEOUS
  Administered 2011-06-20: 5 [IU] via SUBCUTANEOUS
  Administered 2011-06-21: 2 [IU] via SUBCUTANEOUS

## 2011-06-19 MED ORDER — MORPHINE SULFATE 2 MG/ML IJ SOLN
INTRAMUSCULAR | Status: AC
  Administered 2011-06-19: 2 mg via INTRAVENOUS
  Filled 2011-06-19: qty 1

## 2011-06-19 MED ORDER — MORPHINE SULFATE 2 MG/ML IJ SOLN
2.0000 mg | Freq: Once | INTRAMUSCULAR | Status: AC
  Administered 2011-06-19: 2 mg via INTRAVENOUS
  Filled 2011-06-19: qty 1

## 2011-06-19 MED ORDER — TRAMADOL HCL 50 MG PO TABS
50.0000 mg | ORAL_TABLET | Freq: Four times a day (QID) | ORAL | Status: DC | PRN
Start: 2011-06-19 — End: 2011-06-21
  Administered 2011-06-19 – 2011-06-20 (×3): 50 mg via ORAL
  Filled 2011-06-19 (×3): qty 1

## 2011-06-19 MED ORDER — DIPHENHYDRAMINE HCL 25 MG PO CAPS
25.0000 mg | ORAL_CAPSULE | Freq: Four times a day (QID) | ORAL | Status: DC | PRN
  Administered 2011-06-19: 25 mg via ORAL
  Filled 2011-06-19 (×2): qty 1

## 2011-06-19 NOTE — Progress Notes (Signed)
*  PRELIMINARY RESULTS* Echocardiogram 2D Echocardiogram has been performed.  Gary Yates 06/19/2011, 11:27 AM

## 2011-06-19 NOTE — Progress Notes (Signed)
Report from Night RN. Chart reviewed together. Handoff complete.

## 2011-06-19 NOTE — Care Management Note (Signed)
    Page 1 of 1   06/19/2011     8:59:13 AM   CARE MANAGEMENT NOTE 06/19/2011  Patient:  Gary Yates, Gary Yates   Account Number:  [id number]  Date Initiated:  06/19/2011  Documentation initiated by:  Elissa Hefty  Subjective/Objective Assessment:   adm w ch pain, svt     Action/Plan:   lives w wife, pcp dr Darnell Level [place]chette   Anticipated DC Date:     Anticipated DC Plan:        [place]  CM consult      Choice offered to / List presented to:             Status of service:   Medicare Important Message given?   (If response is "NO", the following Medicare IM given date fields will be blank) Date Medicare IM given:   Date Additional Medicare IM given:    Discharge Disposition:    Per UR Regulation:  Reviewed for med. necessity/level of care/duration of stay  If discussed at [place] of Stay Meetings, dates discussed:    Comments:  6/12 9:00a debbie Denny Mccree rn,bsn [id number]

## 2011-06-19 NOTE — Progress Notes (Addendum)
Patient Name: Gary Yates Date of Encounter: 06/19/2011   Principal Problem:  *Midsternal chest pain Active Problems:  Coronary artery disease  DIABETES MELLITUS, TYPE II  HYPERLIPIDEMIA  Diabetic peripheral neuropathy  Chronic kidney disease  Hypertension  COPD (chronic obstructive pulmonary disease)  SVT (supraventricular tachycardia)  G E R D   SUBJECTIVE  No further c/p, sob, SVT.  Pt and wife report that sometimes he has slurring of his speech @ home and his speech was slurred @ onset of Ss yesterday.  He felt weak but did not have unilateral wkns.  CURRENT MEDS    . adenosine      . amitriptyline  20 mg Oral QHS  . aspirin EC  81 mg Oral Daily  . atorvastatin  10 mg Oral q1800  . fenofibrate  54 mg Oral Daily  . fentaNYL      . gabapentin  300 mg Oral Q24H  . gabapentin  600 mg Oral BID  . glimepiride  4 mg Oral QAC breakfast  . heparin      . heparin  5,000 Units Intravenous Once  . insulin glargine  44 Units Subcutaneous BID  . lidocaine      . lisinopril  5 mg Oral Daily  . metoprolol  2.5 mg Intravenous Once  . metoprolol succinate  50 mg Oral Daily  . midazolam      .  morphine injection  2 mg Intravenous Once  . morphine      . nitroGLYCERIN      . pantoprazole  40 mg Oral Q1200  . tiotropium  18 mcg Inhalation Daily  . verapamil      . DISCONTD: aspirin  324 mg Oral Once  . DISCONTD: aspirin  81 mg Oral Daily  . DISCONTD: heparin  60 Units/kg Intravenous STAT   OBJECTIVE  Filed Vitals:   06/19/11 0200 06/19/11 0300 06/19/11 0337 06/19/11 0500  BP: 111/48 104/52  123/68  Pulse: 88 81  85  Temp:   97.4 F (36.3 C)   TempSrc:   Oral   Resp: 12 11  14   Height:      Weight:      SpO2: 97% 98%  94%    Intake/Output Summary (Last 24 hours) at 06/19/11 0838 Last data filed at 06/19/11 0600  Gross per 24 hour  Intake 794.35 ml  Output    500 ml  Net 294.35 ml   Filed Weights   06/18/11 2033 06/18/11 2230  Weight: 277 lb (125.646 kg) 275  lb 5.7 oz (124.9 kg)    PHYSICAL EXAM  General: Pleasant, NAD. Neuro: Alert and oriented X 3. Moves all extremities spontaneously. Psych: Normal affect. HEENT:  Normal  Neck: Supple without bruits.  Difficult to assess JVP 2/2 obesity. Lungs:  Resp regular and unlabored, CTA. Heart: RRR no s3, s4, or murmurs. Abdomen: Soft, non-tender, non-distended, BS + x 4.  Extremities: No clubbing, cyanosis or edema. DP/PT/Radials 2+ and equal bilaterally.  R radial and R femoral cath sites are w/o bleeding/bruit/hematoma.  Accessory Clinical Findings  CBC  Basename 06/18/11 2025  WBC 7.5  NEUTROABS 4.0  HGB 16.015.1  HCT 47.044.6  MCV 89.0  PLT 99991111   Basic Metabolic Panel  Basename [id number] 2300 06/18/11 2025  NA 136 139136  K 4.3 4.24.4  CL 102 105102  CO2 22 21  GLUCOSE 206* 267*264*  BUN 17 2018  CREATININE 1.59* 1.60*1.56*  CALCIUM 9.0 9.1  MG -- --  PHOS -- --  Liver Function Tests  Basename 06/18/11 2300 06/18/11 2025  AST 25 34  ALT 12 13  ALKPHOS 56 51  BILITOT 0.3 0.3  PROT 6.3 6.6  ALBUMIN 3.2* 3.3*   Cardiac Enzymes  Basename 06/19/11 0434 06/18/11 2231  CKTOTAL 183 209  CKMB 4.3* 4.6*  CKMBINDEX -- --  TROPONINI <0.30 <0.30   D-Dimer  Basename 06/18/11 2130  DDIMER 0.47   TELE  RSR  ECG  RSR, 77, RBBB, LAFB  ASSESSMENT AND PLAN  1.  Midsternal Chest Pain/CAD:  S/p cath last night revealing moderate nonobs dzs in LAD and patent RCA stents.  NL EF.  D Dimer and CE are normal.  Suspect Ss may have been secondary to SVT.  Cont asa, statin, bb.  Ambulate today.  Tx to tele.  2.  SVT:  Rates recorded up to 190 on EMS ECG.  Suspect this was driving Ss. Will have EP review ECG - ? Rapid atrial flutter.  Did not respond to adenosine x 2 in field.  Not clear when rhythm broke but ECG in ER shows rate of 132.  Check echo and tsh.  3.  Slurred Speech:  Pt reports occas episodes of slurring of his speech and his wife noted slurring yesterday  during c/p.  Will check head CT.  Neuro exam is nonfocal this AM.  4.  Periph Neuropathy:  Pt says he freq has leg pain @ night and last night was quite bad for him.  He is currently stable.  Resume home dose of neurontin w/ prn mso4 and ultram.  5.  DM:  Cont home dose of lantus and amaryl.  Add ssi.  Check a1c.  6.  HTN:  Stable.  7.  HL:  Cont statin.  Check lipids.  lft's ok.  8.  Stage II CKD:  F/u creatinine in AM post-contrast.  Signed, Gary Hodgkins NP   I have personally seen and examined this patient with Gary Bayley, NP. I agree with the assessment and plan as outlined above. It appears that his chest pain and SOB were driven by SVT. Cardiac cath without flow limiting lesions last night.  He also describes episodes of palpitations at home which lead to dyspnea and slurred speech. Will ask EP to see today. Will order head CT given his c/o slurred speech. Echo today. Transfer to telemetry.   Gary Yates 10:24 AM 06/19/2011

## 2011-06-19 NOTE — Consult Note (Signed)
ELECTROPHYSIOLOGY CONSULT NOTE  Patient ID: Gary Yates, MRN: [id number], DOB/AGE: [birth year] 78 y.o. Admit date: 06/18/2011 Date of Consult: 06/19/2011  Primary Physician: [name], MD Primary Cardiologist: cm  Chief Complaint: SVT   HPI Gary Yates is a 78 y.o. male  : Is seen because of tachycardia identified yesterday evening.  He had been sitting watching his son playing baseball. He began to develop palpitations. This was followed within minutes of chest discomfort and diaphoresis. He was accompanied by shortness of breath. Because of the progressive symptoms 911 was called. On arrival he was found to have a heart rate of 190 or so. He received adenosine but we do not have the report as to what IT did but we do note that did not terminate the rhythm. The patient did not have flushing with ejection.  On arrival to the emergency room electrocardiogram that 190, 180, and 130 were all obtained. Each of these shows progressive degrees irregularity in the setting of the right bundle branch block which is his baseline.  Because of the aforementioned symptoms he underwent catheterization last night. He has known coronary artery disease with stents placed in 2004. Last night catheterization demonstrated modest LAD stenosis normal left ventricular function and patent RCA where the stents had been placed.  The patient denies prior episodes of the above apart from the chest discomfort. He denies specifically any tachypalpitations. He has no diagnosis of atrial fibrillation.  He does however describe "arthritis". His referred for specifically to weakness in his right hand. This has been noted over the last couple of months. There've been some degree of weakness in his left hand. These have both been unassociated with pain.  He was also noted to have slurred speech. Asking me to clarify this was not all that enlightening what sounded like he was trying to sleep but was coming out not  clearly and he felt like his palm was heavy.  He also has diabetes with significant lower extremity neuropathy.  Thromboembolic risk factors are notable for age-52, hypertension-1 diabetes-1 vascular disease-1 and possible prior TIA-2 for a CHADS-VASc score of 5-7      Past Medical History  Diagnosis Date  . Coronary artery disease     a. h/o Overlapping stents RCA;  b. 06/2011 Cath: patent stents, nonobs dzs, NL EF.  Marland Kitchen Hypertension   . Osteoarthritis     shoulder  . Restless leg   . Hyperlipidemia   . COPD (chronic obstructive pulmonary disease)   . Nephrolithiasis   . SVT (supraventricular tachycardia)   . Dyspnea   . GERD (gastroesophageal reflux disease)   . DM (diabetes mellitus)     Type 2, peripheral neuropathy.  . Diabetic peripheral neuropathy       Surgical History:  Past Surgical History  Procedure Date  . Cholecystectomy      Home Meds: Prior to Admission medications   Medication Sig Start Date End Date Taking? Authorizing Provider  amitriptyline (ELAVIL) 10 MG tablet 2 tabs at bedtime 02/15/11  Yes [name], MD  aspirin 81 MG tablet Take 81 mg by mouth daily.     Yes Historical Provider, MD  atorvastatin (LIPITOR) 10 MG tablet TAKE 1 TABLET EVERY DAY 05/15/11  Yes [name], MD  Calcium Carbonate-Vitamin D (CALCIUM + D PO) Take by mouth daily.     Yes Historical Provider, MD  Cholecalciferol (VITAMIN D-3 PO) Take by mouth daily.     Yes Historical Provider, MD  fenofibrate micronized (  LOFIBRA) 134 MG capsule Take 1 capsule (134 mg total) by mouth daily before breakfast. 11/28/10  Yes [name], MD  gabapentin (NEURONTIN) 300 MG capsule 2 tabs in the AM, 2 tab in the PM and 1 tab midday 06/11/11  Yes [name], MD  glimepiride (AMARYL) 4 MG tablet Take 1 tablet (4 mg total) by mouth daily before breakfast. 11/26/10  Yes [name], MD  glucose blood (ACCU-CHEK AVIVA PLUS) test strip Test BS 3 times daily 08/22/10  Yes [name], MD  insulin glargine (LANTUS) 100 UNIT/ML injection Inject 44 Units into the skin 2 (two) times daily. 11/12/10  Yes [name], MD  Lancets (ACCU-CHEK MULTICLIX) lancets TEST ONCE DAILY AS DIRECTED 06/12/10  Yes Ricard Dillon, MD  lisinopril (PRINIVIL,ZESTRIL) 5 MG tablet Take 1 tablet (5 mg total) by mouth daily. 10/29/10  Yes [name], MD  metoprolol succinate (TOPROL-XL) 50 MG 24 hr tablet Take 50 mg by mouth daily.  01/14/11  Yes Historical Provider, MD  multivitamin [facility]) per tablet Take 1 tablet by mouth daily.     Yes Historical Provider, MD  nitroGLYCERIN (NITROSTAT) 0.4 MG SL tablet Place 0.4 mg under the tongue every 5 (five) minutes as needed.     Yes Historical Provider, MD  NON FORMULARY lantus solostar pen needle    Yes Historical Provider, MD  omeprazole (PRILOSEC) 20 MG capsule Take 1 capsule (20 mg total) by mouth daily. 11/26/10  Yes [name], MD  testosterone cypionate (DEPOTESTOTERONE CYPIONATE) 200 MG/ML injection Inject 200 mg into the muscle every 14 (fourteen) days. 01/23/11 07/03/12 Yes [name], MD  tiotropium (SPIRIVA HANDIHALER) 18 MCG inhalation capsule Place 1 capsule (18 mcg total) into inhaler and inhale daily. 06/04/11 06/03/12 Yes [name], MD    Inpatient Medications:    . adenosine      . amitriptyline  20 mg Oral QHS  . aspirin EC  81 mg Oral Daily  . atorvastatin  10 mg Oral q1800  . fenofibrate  54 mg Oral Daily  . fentaNYL      . gabapentin  300 mg Oral Q24H  . gabapentin  600 mg Oral BID  . glimepiride  4 mg Oral QAC breakfast  . heparin      . heparin  5,000 Units Intravenous Once  . insulin aspart  0-15 Units Subcutaneous TID WC  . insulin glargine  44 Units Subcutaneous BID  . lidocaine      . lisinopril  5 mg Oral Daily  . metoprolol  2.5 mg Intravenous Once  . metoprolol succinate  50 mg Oral Daily  . midazolam      .  morphine injection  2 mg Intravenous Once  . nitroGLYCERIN      .  pantoprazole  40 mg Oral Q1200  . tiotropium  18 mcg Inhalation Daily  . verapamil      . DISCONTD: aspirin  324 mg Oral Once  . DISCONTD: aspirin  81 mg Oral Daily  . DISCONTD: heparin  60 Units/kg Intravenous STAT    Allergies:  Allergies  Allergen Reactions  . Ace Inhibitors     REACTION: cough  . Codeine     REACTION: Itch  . Penicillins     History   Social History  . Marital Status: Married    Spouse Name: N/A    Number of Children: 3  . Years of Education: N/A   Occupational History  .  Retired    Social History Main Topics  . Smoking status: Former Smoker -- 1.5 packs/day for 20 years    Types: Cigarettes    Quit date: 04/05/1983  . Smokeless tobacco: Never Used  . Alcohol Use: No  . Drug Use: No  . Sexually Active: Not on file   Other Topics Concern  . Not on file   Social History Narrative  . No narrative on file     Family History  Problem Relation Age of Onset  . Coronary artery disease      Male 1st degree relative <50  . Coronary artery disease      male 1st degree relative <60  . Heart disease Father   . Migraines Father   . Ulcers Father   . Alzheimer's disease Mother   . Heart disease Mother   . Heart disease Sister   . Obesity Sister     Morbid  . Arthritis Sister   . Heart disease Brother   . Arthritis Brother   . Sleep apnea Son   . Obesity Son   . Migraines Daughter   . Thyroid disease Daughter      ROS:  Please see the history of present illness.     All other systems reviewed and negative.    Physical Exam: Blood pressure 149/94, pulse 95, temperature 97.2 F (36.2 C), temperature source Oral, resp. rate 18, height 5\' 10"  (1.778 m), weight 275 lb 5.7 oz (124.9 kg), SpO2 96.00%. General: Well developed, obese age appearing 14 male in no acute distress. Head: Normocephalic, atraumatic, sclera non-icteric, no xanthomas, nares are without discharge. Lymph Nodes:  none Neck: Negative for carotid bruits. JVD not  elevated. Lungs: Clear bilaterally to auscultation without wheezes, rales, or rhonchi. Breathing is unlabored. Heart: RRR with S1 S2. No murmurs, rubs, or gallops appreciated. Abdomen: Soft, non-tender, non-distended with normoactive bowel sounds. No hepatomegaly. No rebound/guarding. No obvious abdominal masses. Msk:  Strength and tone appear normal for age. Extremities: No clubbing or cyanosis. No edema.  Distal pedal pulses are 2+ and equal bilaterally. Skin: Warm and Dry Neuro: Alert and oriented X 3. CN III-XII intact  There is weakness in his right hand with opposition finger extension there also seems to be some right forearm weakness Psych:  Responds to questions appropriately with a normal affect.      Labs: Cardiac Enzymes  Basename 06/19/11 1027 06/19/11 0434 06/18/11 2231  CKTOTAL 176 183 209  CKMB 4.3* 4.3* 4.6*  TROPONINI <0.30 <0.30 <0.30   CBC Lab Results  Component Value Date   WBC 7.5 06/18/2011   HGB 16.0 06/18/2011   HGB 15.1 06/18/2011   HCT 47.0 06/18/2011   HCT 44.6 06/18/2011   MCV 89.0 06/18/2011   PLT 186 06/18/2011   PROTIME:  Basename 06/18/11 2300 06/18/11 2025  LABPROT 14.0 13.6  INR 1.06 1.02   Chemistry  Lab 06/18/11 2300  NA 136  K 4.3  CL 102  CO2 22  BUN 17  CREATININE 1.59*  CALCIUM 9.0  PROT 6.3  BILITOT 0.3  ALKPHOS 56  ALT 12  AST 25  GLUCOSE 206*   Lipids Lab Results  Component Value Date   CHOL 205* 03/20/2011   HDL 37.20* 03/20/2011   TRIG 539.0 Triglyceride is over 400; calculations on Lipids are invalid.* 03/20/2011   BNP No results found for this basename: probnp   Miscellaneous Lab Results  Component Value Date   DDIMER 0.47 06/18/2011    Radiology/Studies:  Ct Head Wo Contrast  06/19/2011  *RADIOLOGY REPORT*  Clinical Data: Chest pain and short of breath.  Slurred speech and dizziness.  CT HEAD WITHOUT CONTRAST  Technique:  Contiguous axial images were obtained from the base of the skull through the vertex  without contrast.  Comparison: None.  Findings: Generalized atrophy.  No acute infarct.  Negative for hemorrhage or mass lesion.  Calvarium is intact.  Visualized sinuses are clear.  IMPRESSION: No acute abnormality.  Original Report Authenticated By: Truett Perna, M.D.    EKG: Sinus rhythm with right bundle branch block left axis deviation   Assessment and Plan:  Patient presented with tachycardia. He is somewhat irregular 194 more irregular at  180 and still more irregular 130. It is most consistent with atrial fibrillation. In this regard, he is right upper extremity weakness which is quite noticeable is concerning for possible neurological thromboembolic event as is the slurred speech which accompanied the event yesterday. While his CT scan was negative, we'll undertake an MRI. In the event that this is abnormal he would clearly need anticoagulation which she would need in any case. However, if the MRI is not her normal neurological consultation will be clearly indicated to explain his right upper extremity weakness and it may be a cord lesion for which we would not want to initiate anticoagulation until we have more clarity.  Based on the above we will #1 order an MRI #2 anticipate neurological consultation the morning for weakness and lower extremity neuropathy #3 anticipate anticoagulation once we have the understanding of #2 #4 given the severity of symptoms and a rapid rate, we will need to consider antiarrhythmic/rate control and therapy for his tachycardia Thank you for the consultation Deboraha Sprang M.D.    Virl Axe

## 2011-06-19 NOTE — Progress Notes (Signed)
CARDIAC REHAB PHASE I   PRE:  Rate/Rhythm: 88 SR    BP: sitting 150/71    SaO2: 96 RA  MODE:  Ambulation: 350 ft   POST:  Rate/Rhythm: 104 ST    BP: sitting 136/72     SaO2: 94 RA  Pt sts that morphine helped his legs. Did not hurt walking. Tolerated fairly well. HR stable. Sts his ex tol has decreased lately. Has a hard time walking to mailbox and back, which is 190 ft. Gave pt ex gl to start. Also gave diet sheets. Will f/u. [id number]  Darrick Meigs CES, ACSM

## 2011-06-20 ENCOUNTER — Inpatient Hospital Stay (HOSPITAL_COMMUNITY): Payer: Medicare Other

## 2011-06-20 DIAGNOSIS — E1149 Type 2 diabetes mellitus with other diabetic neurological complication: Secondary | ICD-10-CM

## 2011-06-20 DIAGNOSIS — N189 Chronic kidney disease, unspecified: Secondary | ICD-10-CM

## 2011-06-20 DIAGNOSIS — N058 Unspecified nephritic syndrome with other morphologic changes: Secondary | ICD-10-CM

## 2011-06-20 DIAGNOSIS — R269 Unspecified abnormalities of gait and mobility: Secondary | ICD-10-CM

## 2011-06-20 LAB — CBC
Hemoglobin: 14 g/dL (ref 13.0–17.0)
MCHC: 33.2 g/dL (ref 30.0–36.0)
RDW: 13.2 % (ref 11.5–15.5)
WBC: 8.6 10*3/uL (ref 4.0–10.5)

## 2011-06-20 LAB — BASIC METABOLIC PANEL
Chloride: 99 mEq/L (ref 96–112)
GFR calc Af Amer: 47 mL/min — ABNORMAL LOW (ref >90)
GFR calc non Af Amer: 41 mL/min — ABNORMAL LOW (ref >90)
Potassium: 4 mEq/L (ref 3.5–5.1)
Sodium: 135 mEq/L (ref 135–145)

## 2011-06-20 LAB — GLUCOSE, CAPILLARY
Glucose-Capillary: 241 mg/dL — ABNORMAL HIGH (ref 70–99)
Glucose-Capillary: 111 mg/dL — ABNORMAL HIGH (ref 70–99)
Glucose-Capillary: 188 mg/dL — ABNORMAL HIGH (ref 70–99)

## 2011-06-20 LAB — LIPID PANEL
Cholesterol: 171 mg/dL (ref 0–200)
HDL: 25 mg/dL — ABNORMAL LOW (ref >39)
Triglycerides: 327 mg/dL — ABNORMAL HIGH (ref <150)

## 2011-06-20 MED ORDER — ASPIRIN EC 81 MG PO TBEC
81.0000 mg | DELAYED_RELEASE_TABLET | Freq: Every day | ORAL | Status: DC
  Administered 2011-06-21: 81 mg via ORAL
  Filled 2011-06-20 (×2): qty 1

## 2011-06-20 MED ORDER — CLOPIDOGREL BISULFATE 75 MG PO TABS
75.0000 mg | ORAL_TABLET | Freq: Every day | ORAL | Status: DC
  Filled 2011-06-20: qty 1

## 2011-06-20 MED ORDER — CHRONIC LUNG DISEASE PATIENT EDUCATION BOOK
Freq: Once | Status: AC
  Administered 2011-06-20: 11:00:00
  Filled 2011-06-20: qty 1

## 2011-06-20 NOTE — Progress Notes (Signed)
CARDIAC REHAB PHASE I   PRE:  Rate/Rhythm: 77 SR  BP:  Supine:   Sitting: 112/60  Standing:    SaO2: 94 RAS  MODE:  Ambulation: 890 ft   POST:  Rate/Rhythem: 91  BP:  Supine:   Sitting: 120/72  Standing:    SaO2: 92 RA 1500-1525 Tolerated ambulation well without  c/o of of cp or SOB. VS stable. , gait steady. Discussed risk factor for CAD and Outpt. CRP. Pt agrees to referral to Outpt. CRP in [place].  Deon Pilling

## 2011-06-20 NOTE — Progress Notes (Signed)
SUBJECTIVE: No chest pain or SOB. Still feels that his arms are weak and still feels dizzy.   Telemetry: sinus with PVCs  BP 139/81  Pulse 85  Temp 97 F (36.1 C) (Oral)  Resp 19  Ht 5\' 10"  (1.778 m)  Wt 275 lb 5.7 oz (124.9 kg)  BMI 39.51 kg/m2  SpO2 93%  Intake/Output Summary (Last 24 hours) at 06/20/11 0839 Last data filed at 06/19/11 1820  Gross per 24 hour  Intake    326 ml  Output      0 ml  Net    326 ml    PHYSICAL EXAM General: Well developed, well nourished, in no acute distress. Alert and oriented x 3.  Psych:  Good affect, responds appropriately Neck: No JVD. No masses noted.  Lungs: Clear bilaterally with no wheezes or rhonci noted.  Heart: RRR with no murmurs noted. Abdomen: Bowel sounds are present. Soft, non-tender.  Extremities: No lower extremity edema. Bilateral DP/PT pulses are 2+  LABS: Basic Metabolic Panel:  Basename 06/20/11 0552 06/18/11 2300  NA 135 136  K 4.0 4.3  CL 99 102  CO2 25 22  GLUCOSE 127* 206*  BUN 15 17  CREATININE 1.58* 1.59*  CALCIUM 9.5 9.0  MG -- --  PHOS -- --   CBC:  Basename 06/20/11 0552 06/18/11 2025  WBC 8.6 7.5  NEUTROABS -- 4.0  HGB 14.0 16.015.1  HCT 42.2 47.044.6  MCV 89.0 89.0  PLT 190 186   Cardiac Enzymes:  Basename 06/19/11 1027 06/19/11 0434 06/18/11 2231  CKTOTAL 176 183 209  CKMB 4.3* 4.3* 4.6*  CKMBINDEX -- -- --  TROPONINI <0.30 <0.30 <0.30   Fasting Lipid Panel:  Basename 06/20/11 0552  CHOL 171  HDL 25*  LDLCALC 81  TRIG 327*  CHOLHDL 6.8  LDLDIRECT --    Current Meds:    . amitriptyline  20 mg Oral QHS  . aspirin EC  81 mg Oral Daily  . atorvastatin  10 mg Oral q1800  . fenofibrate  54 mg Oral Daily  . gabapentin  300 mg Oral Q24H  . gabapentin  600 mg Oral BID  . glimepiride  4 mg Oral QAC breakfast  . insulin aspart  0-15 Units Subcutaneous TID WC  . insulin glargine  44 Units Subcutaneous BID  . lisinopril  5 mg Oral Daily  . metoprolol succinate  50 mg  Oral Daily  . pantoprazole  40 mg Oral Q1200  . tiotropium  18 mcg Inhalation Daily     ASSESSMENT AND PLAN:  1. CAD: Pt  Admitted with tachycardia and midsternal CP. Cardiac cath 06/18/11 with moderate CAD but no flow limiting lesions. Normal LVEF.  D Dimer and CE are normal. Likely that his chest pain was secondary to moderate CAD with tachycardia causing demand ischemia. Continue medical management.    2. Tachycardia: Pt seen by Dr. Caryl Comes last night. Suspicion for atrial fibrillation. PVCs on telemetry without recurrence of atrial arrythmia. He will likely need titration of beta blocker or anti-arrythmic therapy. He will also need long term anti-coagulation with coumadin but will hold off on anti-coagulation until neuro workup complete.    3. Slurred Speech: Head CT yesterday without evidence of CVA. Head MRI this am as ordered by Dr. Caryl Comes. Pt reports occasional  episodes of slurring of his speech and his wife noted slurring during his episode of chest pain. He also has some upper extremity weakness. Carotid artery dopplers 08/07/10 with mild (0-39%)  bilateral ICA stenosis. I do not see a need to repeat this. Will ask Neurology for evaluation of arm weakness, dizziness, slurred speech as an inpatient.   4. Peripheral Neuropathy: Continue Neurontin with prn narcotics for breakthrough pain   5. DM: Cont home dose of lantus and amaryl. Continue SSI.  6. HTN: Stable.   7. Hyperlipidemia:  Cont statin.  8. Stage II CKD: renal function stable post cath.      Roshaunda Starkey  6/13/20138:39 AM

## 2011-06-20 NOTE — Consult Note (Signed)
[facility] CONSULT [facility]     Reason for Consult: bilateral upper extremity weakness, slurred speech and dizziness    HPI:    Kedwin Whitecotton is an 78 y.o. male   has a past medical history of Coronary artery disease; Hypertension; Osteoarthritis; Restless leg; Hyperlipidemia; COPD (chronic obstructive pulmonary disease); Nephrolithiasis; SVT (supraventricular tachycardia); Dyspnea; GERD (gastroesophageal reflux disease); DM (diabetes mellitus); and Diabetic peripheral neuropathy who was initially brought to the hospital for chest pressure and sever diaphoresis. While in the hospital he underwent cardiac catheterization which showed no significant lesions and normal left ventricular function. Cardiology is recommending continuation of medical therapy.  While in the hospital his wife was concerned about his intermittent slurred speech over last month, right upper extremity weakness and imbalance. Patient has known diabetic neuropathy and is already on neurontin and dose has recently been increased.   Imbalance:  Has been occuring for at least one year.  He notes when he is standing in church he often will sway and at times needs to lean up against a chair. Due to his diabetes he cannot feel his feet very well and often times will stumble at night when lights are off. He often notes when he stands up, he can take a few steps and then becomes light headed. This will fade after about 2 seconds and he will be back to baseline.  Right arm weakness: Patient himself has not noted any weakness.  On exam I did not note any weakness.   Slurred speech:  His wife has noted over past few months he will slur one or two words intermittently but no continuous speech difficulty.  He noted slurred speech yesterday while in the ambulance and actively in SVT but now cleared.   Past Medical History  Diagnosis Date  . Coronary artery disease     a. h/o Overlapping stents RCA;  b. 06/2011  Cath: patent stents, nonobs dzs, NL EF.  Marland Kitchen Hypertension   . Osteoarthritis     shoulder  . Restless leg   . Hyperlipidemia   . COPD (chronic obstructive pulmonary disease)   . Nephrolithiasis   . SVT (supraventricular tachycardia)   . Dyspnea   . GERD (gastroesophageal reflux disease)   . DM (diabetes mellitus)     Type 2, peripheral neuropathy.  . Diabetic peripheral neuropathy     Past Surgical History  Procedure Date  . Cholecystectomy     Family History  Problem Relation Age of Onset  . Coronary artery disease      Male 1st degree relative <50  . Coronary artery disease      male 1st degree relative <60  . Heart disease Father   . Migraines Father   . Ulcers Father   . Alzheimer's disease Mother   . Heart disease Mother   . Heart disease Sister   . Obesity Sister     Morbid  . Arthritis Sister   . Heart disease Brother   . Arthritis Brother   . Sleep apnea Son   . Obesity Son   . Migraines Daughter   . Thyroid disease Daughter     Social History:  reports that he quit smoking about 28 years ago. His smoking use included Cigarettes. He has a 30 pack-year smoking history. He has never used smokeless tobacco. He reports that he does not drink alcohol or use  illicit drugs.  Allergies  Allergen Reactions  . Ace Inhibitors     REACTION: cough  . Codeine Nausea Only    REACTION: rash  . Penicillins Rash    Medications:    Prior to Admission:  Prescriptions prior to admission  Medication Sig Dispense Refill  . amitriptyline (ELAVIL) 10 MG tablet Take 10 mg by mouth at bedtime.      Marland Kitchen aspirin 81 MG tablet Take 81 mg by mouth daily.        Marland Kitchen atorvastatin (LIPITOR) 10 MG tablet Take 10 mg by mouth daily.      . Calcium Carbonate-Vitamin D (CALCIUM + D PO) Take by mouth daily.        . Cholecalciferol (VITAMIN D-3 PO) Take by mouth daily.        . diphenhydrAMINE (BENADRYL) 25 MG tablet Take 25 mg by mouth daily.      . fenofibrate micronized (LOFIBRA) 134  MG capsule Take 1 capsule (134 mg total) by mouth daily before breakfast.  90 capsule  3  . gabapentin (NEURONTIN) 300 MG capsule 2 tabs in the AM, 2 tab in the PM and 1 tab midday  450 capsule  3  . glimepiride (AMARYL) 4 MG tablet Take 1 tablet (4 mg total) by mouth daily before breakfast.  90 tablet  4  . glucose blood (ACCU-CHEK AVIVA PLUS) test strip Test BS 3 times daily  100 each  6  . insulin glargine (LANTUS) 100 UNIT/ML injection Inject 44 Units into the skin 2 (two) times daily.      . Lancets (ACCU-CHEK MULTICLIX) lancets TEST ONCE DAILY AS DIRECTED  100 each  2  . lisinopril (PRINIVIL,ZESTRIL) 5 MG tablet Take 1 tablet (5 mg total) by mouth daily.  90 tablet  4  . metoprolol succinate (TOPROL-XL) 50 MG 24 hr tablet Take 50 mg by mouth daily.       . multivitamin (THERAGRAN) per tablet Take 1 tablet by mouth daily.        . nitroGLYCERIN (NITROSTAT) 0.4 MG SL tablet Place 0.4 mg under the tongue every 5 (five) minutes as needed. Chest pain      . omeprazole (PRILOSEC) 20 MG capsule Take 1 capsule (20 mg total) by mouth daily.  90 capsule  3  . testosterone cypionate (DEPOTESTOTERONE CYPIONATE) 200 MG/ML injection Inject 200 mg into the muscle every 14 (fourteen) days.      Marland Kitchen tiotropium (SPIRIVA HANDIHALER) 18 MCG inhalation capsule Place 1 capsule (18 mcg total) into inhaler and inhale daily.  30 capsule  12   Scheduled:   . amitriptyline  20 mg Oral QHS  . aspirin EC  81 mg Oral Daily  . atorvastatin  10 mg Oral q1800  . fenofibrate  54 mg Oral Daily  . gabapentin  300 mg Oral Q24H  . gabapentin  600 mg Oral BID  . glimepiride  4 mg Oral QAC breakfast  . insulin aspart  0-15 Units Subcutaneous TID WC  . insulin glargine  44 Units Subcutaneous BID  . lisinopril  5 mg Oral Daily  . metoprolol succinate  50 mg Oral Daily  . pantoprazole  40 mg Oral Q1200  . tiotropium  18 mcg Inhalation Daily  . to air is human book   Does not apply Once    Review of Systems - General ROS:  negative for - chills, fatigue, fever or hot flashes Hematological and Lymphatic ROS: negative for - bruising, fatigue, jaundice or pallor  Endocrine ROS: negative for - hair pattern changes, hot flashes, mood swings or skin changes Respiratory ROS: negative for - cough, hemoptysis, orthopnea or wheezing Cardiovascular ROS: positive for - dyspnea on exertion, orthopnea, palpitations or shortness of breath Gastrointestinal ROS: negative for - abdominal pain, appetite loss, blood in stools, diarrhea or hematemesis Musculoskeletal ROS: negative for - joint pain, joint stiffness, joint swelling or muscle pain Neurological ROS: positive for - weakness, dizziness and slurred speech Dermatological ROS: negative for dry skin, pruritus and rash   Blood pressure 131/71, pulse 80, temperature 97 F (36.1 C), temperature source Oral, resp. rate 19, height 5\' 10"  (1.778 m), weight 124.9 kg (275 lb 5.7 oz), SpO2 95.00%.   Neurologic Examination:   Mental Status: Alert, oriented, thought content appropriate.  Speech fluent without evidence of aphasia. Able to follow 3 step commands without difficulty. Cranial Nerves: II-Visual fields grossly intact. III/IV/VI-Extraocular movements intact.  Pupils reactive bilaterally. V/VII-Smile symmetric VIII-grossly intact IX/X-normal gag XI-bilateral shoulder shrug XII-midline tongue extension Motor: 5/5 bilaterally with normal tone and bulk Sensory: Decreased sensation from ankle to toes, he has decreased proprioception of large toe bilaterally. Unable to sense vibration at toes and ankle.  Otherwise intact. Deep Tendon Reflexes: 1+ and symmetric throughout Plantars downgoing bilaterally Cerebellar: Normal finger-to-nose, normal rapid alternating movements and normal heel-to-shin test.   Gait: Normal gait and station--no wide base gait noted.   Lab Results  Component Value Date/Time   CHOL 171 06/20/2011  5:52 AM    Results for orders placed during the  hospital encounter of 06/18/11 (from the past 48 hour(s))  POCT I-STAT TROPONIN I     Status: Normal   Collection Time   06/18/11  8:23 PM      Component Value Range Comment   Troponin i, poc 0.00  0.00 - 0.08 ng/mL    Comment 3            POCT I-STAT, CHEM 8     Status: Abnormal   Collection Time   06/18/11  8:25 PM      Component Value Range Comment   Sodium 139  135 - 145 mEq/L    Potassium 4.2  3.5 - 5.1 mEq/L    Chloride 105  96 - 112 mEq/L    BUN 20  6 - 23 mg/dL    Creatinine, Ser 1.60 (*) 0.50 - 1.35 mg/dL    Glucose, Bld 267 (*) 70 - 99 mg/dL    Calcium, Ion 1.16  1.12 - 1.32 mmol/L    TCO2 22  0 - 100 mmol/L    Hemoglobin 16.0  13.0 - 17.0 g/dL    HCT 47.0  39.0 - 52.0 %   APTT     Status: Normal   Collection Time   06/18/11  8:25 PM      Component Value Range Comment   aPTT 31  24 - 37 seconds   CBC     Status: Normal   Collection Time   06/18/11  8:25 PM      Component Value Range Comment   WBC 7.5  4.0 - 10.5 K/uL    RBC 5.01  4.22 - 5.81 MIL/uL    Hemoglobin 15.1  13.0 - 17.0 g/dL    HCT 44.6  39.0 - 52.0 %    MCV 89.0  78.0 - 100.0 fL    MCH 30.1  26.0 - 34.0 pg    MCHC 33.9  30.0 - 36.0 g/dL    RDW 13.2  11.5 - 15.5 %    Platelets 186  150 - 400 K/uL   COMPREHENSIVE METABOLIC PANEL     Status: Abnormal   Collection Time   06/18/11  8:25 PM      Component Value Range Comment   Sodium 136  135 - 145 mEq/L    Potassium 4.4  3.5 - 5.1 mEq/L HEMOLYSIS AT THIS LEVEL MAY AFFECT RESULT   Chloride 102  96 - 112 mEq/L    CO2 21  19 - 32 mEq/L    Glucose, Bld 264 (*) 70 - 99 mg/dL    BUN 18  6 - 23 mg/dL    Creatinine, Ser 1.56 (*) 0.50 - 1.35 mg/dL    Calcium 9.1  8.4 - 10.5 mg/dL    Total Protein 6.6  6.0 - 8.3 g/dL    Albumin 3.3 (*) 3.5 - 5.2 g/dL    AST 34  0 - 37 U/L HEMOLYSIS AT THIS LEVEL MAY AFFECT RESULT   ALT 13  0 - 53 U/L    Alkaline Phosphatase 51  39 - 117 U/L    Total Bilirubin 0.3  0.3 - 1.2 mg/dL    GFR calc non Af Amer 41 (*) >90 mL/min     GFR calc Af Amer 48 (*) >90 mL/min   PROTIME-INR     Status: Normal   Collection Time   06/18/11  8:25 PM      Component Value Range Comment   Prothrombin Time 13.6  11.6 - 15.2 seconds    INR 1.02  0.00 - 1.49   DIFFERENTIAL     Status: Normal   Collection Time   06/18/11  8:25 PM      Component Value Range Comment   Neutrophils Relative 53  43 - 77 %    Neutro Abs 4.0  1.7 - 7.7 K/uL    Lymphocytes Relative 32  12 - 46 %    Lymphs Abs 2.4  0.7 - 4.0 K/uL    Monocytes Relative 12  3 - 12 %    Monocytes Absolute 0.9  0.1 - 1.0 K/uL    Eosinophils Relative 3  0 - 5 %    Eosinophils Absolute 0.2  0.0 - 0.7 K/uL    Basophils Relative 1  0 - 1 %    Basophils Absolute 0.1  0.0 - 0.1 K/uL   D-DIMER, QUANTITATIVE     Status: Normal   Collection Time   06/18/11  9:30 PM      Component Value Range Comment   D-Dimer, Quant 0.47  0.00 - 0.48 ug/mL-FEU   POCT ACTIVATED CLOTTING TIME     Status: Normal   Collection Time   06/18/11  9:35 PM      Component Value Range Comment   Activated Clotting Time 155     GLUCOSE, CAPILLARY     Status: Abnormal   Collection Time   06/18/11 10:28 PM      Component Value Range Comment   Glucose-Capillary 200 (*) 70 - 99 mg/dL    Comment 1 Notify RN     CARDIAC PANEL(CRET KIN+CKTOT+MB+TROPI)     Status: Abnormal   Collection Time   06/18/11 10:31 PM      Component Value Range Comment   Total CK 209  7 - 232 U/L    CK, MB 4.6 (*) 0.3 - 4.0 ng/mL    Troponin I <0.30  <0.30 ng/mL    Relative Index 2.2  0.0 -  2.5   MRSA PCR SCREENING     Status: Normal   Collection Time   06/18/11 10:39 PM      Component Value Range Comment   MRSA by PCR NEGATIVE  NEGATIVE   PROTIME-INR     Status: Normal   Collection Time   06/18/11 11:00 PM      Component Value Range Comment   Prothrombin Time 14.0  11.6 - 15.2 seconds    INR 1.06  0.00 - 1.49   TSH     Status: Normal   Collection Time   06/18/11 11:00 PM      Component Value Range Comment   TSH 3.132  0.350 -  4.500 uIU/mL   COMPREHENSIVE METABOLIC PANEL     Status: Abnormal   Collection Time   06/18/11 11:00 PM      Component Value Range Comment   Sodium 136  135 - 145 mEq/L    Potassium 4.3  3.5 - 5.1 mEq/L    Chloride 102  96 - 112 mEq/L    CO2 22  19 - 32 mEq/L    Glucose, Bld 206 (*) 70 - 99 mg/dL    BUN 17  6 - 23 mg/dL    Creatinine, Ser 1.59 (*) 0.50 - 1.35 mg/dL    Calcium 9.0  8.4 - 10.5 mg/dL    Total Protein 6.3  6.0 - 8.3 g/dL    Albumin 3.2 (*) 3.5 - 5.2 g/dL    AST 25  0 - 37 U/L    ALT 12  0 - 53 U/L    Alkaline Phosphatase 56  39 - 117 U/L    Total Bilirubin 0.3  0.3 - 1.2 mg/dL    GFR calc non Af Amer 41 (*) >90 mL/min    GFR calc Af Amer 47 (*) >90 mL/min   CARDIAC PANEL(CRET KIN+CKTOT+MB+TROPI)     Status: Abnormal   Collection Time   06/19/11  4:34 AM      Component Value Range Comment   Total CK 183  7 - 232 U/L    CK, MB 4.3 (*) 0.3 - 4.0 ng/mL    Troponin I <0.30  <0.30 ng/mL    Relative Index 2.3  0.0 - 2.5   CARDIAC PANEL(CRET KIN+CKTOT+MB+TROPI)     Status: Abnormal   Collection Time   06/19/11 10:27 AM      Component Value Range Comment   Total CK 176  7 - 232 U/L    CK, MB 4.3 (*) 0.3 - 4.0 ng/mL    Troponin I <0.30  <0.30 ng/mL    Relative Index 2.4  0.0 - 2.5   HEMOGLOBIN A1C     Status: Abnormal   Collection Time   06/19/11 10:27 AM      Component Value Range Comment   Hemoglobin A1C 9.2 (*) <5.7 %    Mean Plasma Glucose 217 (*) <117 mg/dL   GLUCOSE, CAPILLARY     Status: Abnormal   Collection Time   06/19/11 11:43 AM      Component Value Range Comment   Glucose-Capillary 122 (*) 70 - 99 mg/dL   GLUCOSE, CAPILLARY     Status: Abnormal   Collection Time   06/19/11  4:20 PM      Component Value Range Comment   Glucose-Capillary 118 (*) 70 - 99 mg/dL    Comment 1 Documented in Chart      Comment 2 Notify RN     GLUCOSE, CAPILLARY  Status: Abnormal   Collection Time   06/19/11 10:02 PM      Component Value Range Comment   Glucose-Capillary  127 (*) 70 - 99 mg/dL   CBC     Status: Normal   Collection Time   06/20/11  5:52 AM      Component Value Range Comment   WBC 8.6  4.0 - 10.5 K/uL    RBC 4.74  4.22 - 5.81 MIL/uL    Hemoglobin 14.0  13.0 - 17.0 g/dL    HCT 42.2  39.0 - 52.0 %    MCV 89.0  78.0 - 100.0 fL    MCH 29.5  26.0 - 34.0 pg    MCHC 33.2  30.0 - 36.0 g/dL    RDW 13.2  11.5 - 15.5 %    Platelets 190  150 - 400 K/uL   BASIC METABOLIC PANEL     Status: Abnormal   Collection Time   06/20/11  5:52 AM      Component Value Range Comment   Sodium 135  135 - 145 mEq/L    Potassium 4.0  3.5 - 5.1 mEq/L    Chloride 99  96 - 112 mEq/L    CO2 25  19 - 32 mEq/L    Glucose, Bld 127 (*) 70 - 99 mg/dL    BUN 15  6 - 23 mg/dL    Creatinine, Ser 1.58 (*) 0.50 - 1.35 mg/dL    Calcium 9.5  8.4 - 10.5 mg/dL    GFR calc non Af Amer 41 (*) >90 mL/min    GFR calc Af Amer 47 (*) >90 mL/min   LIPID PANEL     Status: Abnormal   Collection Time   06/20/11  5:52 AM      Component Value Range Comment   Cholesterol 171  0 - 200 mg/dL    Triglycerides 327 (*) <150 mg/dL    HDL 25 (*) >39 mg/dL    Total CHOL/HDL Ratio 6.8      VLDL 65 (*) 0 - 40 mg/dL    LDL Cholesterol 81  0 - 99 mg/dL   GLUCOSE, CAPILLARY     Status: Abnormal   Collection Time   06/20/11  6:10 AM      Component Value Range Comment   Glucose-Capillary 144 (*) 70 - 99 mg/dL    Comment 1 Notify RN     GLUCOSE, CAPILLARY     Status: Abnormal   Collection Time   06/20/11 11:20 AM      Component Value Range Comment   Glucose-Capillary 241 (*) 70 - 99 mg/dL     Ct Head Wo Contrast  06/19/2011  *RADIOLOGY REPORT*  Clinical Data: Chest pain and short of breath.  Slurred speech and dizziness.  CT HEAD WITHOUT CONTRAST  Technique:  Contiguous axial images were obtained from the base of the skull through the vertex without contrast.  Comparison: None.  Findings: Generalized atrophy.  No acute infarct.  Negative for hemorrhage or mass lesion.  Calvarium is intact.   Visualized sinuses are clear.  IMPRESSION: No acute abnormality.  Original Report Authenticated By: Truett Perna, M.D.   2012 Carotid doppler showed bilateral 39% stenosis  MRI Head: IMPRESSION:  Questionable tiny acute infarct posterior left frontal lobe (series  3 image 25) versus artifact.  Mild small vessel disease type changes.  Global atrophy without hydrocephalus.   Assessment/Plan:   78 YO male with complaints of off balance and slurred speech that  have been occuring for 4 weeks off and on.  Patient has known DM with diabetic neuropathy (HbA1C 9.2).  On exam patient exhibited full 5/5 strength throughout, bilateral LE decreased vibratory sensation and proprioception. Speech showed no dysarthria or aphasia. No clinical indications of stroke. Findings on MRI study are likely artifactual.  Patient's gait abnormality is most likely secondary to diabetic peripheral neuropathy.    Recommend: 1) continue Aspirin 81 mg daily 2) Continue BG control and diabetic counseling 3) Neurontin was just increased by PCP--would not change dose at this time. 4) Follow up outpatient with PCP   I have spent >60 minutes with patient with examination, gathering information/chart review and family discussing diabetes, neuropathy, causes of neuropathy and need for BG control.     Etta Quill PA-C [facility] [phone]  06/20/2011, 12:03 PM

## 2011-06-21 ENCOUNTER — Encounter (HOSPITAL_COMMUNITY): Payer: Self-pay | Admitting: Physician Assistant

## 2011-06-21 DIAGNOSIS — R29898 Other symptoms and signs involving the musculoskeletal system: Secondary | ICD-10-CM

## 2011-06-21 DIAGNOSIS — I480 Paroxysmal atrial fibrillation: Secondary | ICD-10-CM

## 2011-06-21 LAB — GLUCOSE, CAPILLARY: Glucose-Capillary: 98 mg/dL (ref 70–99)

## 2011-06-21 MED ORDER — METOPROLOL SUCCINATE ER 100 MG PO TB24: 100.0000 mg | ORAL_TABLET | Freq: Every day | ORAL | Status: DC

## 2011-06-21 MED ORDER — RIVAROXABAN 10 MG PO TABS
20.0000 mg | ORAL_TABLET | Freq: Every day | ORAL | Status: DC
  Filled 2011-06-21: qty 2

## 2011-06-21 MED ORDER — RIVAROXABAN 20 MG PO TABS: 20.0000 mg | ORAL_TABLET | Freq: Every day | ORAL | Status: DC

## 2011-06-21 MED ORDER — TRAMADOL HCL 50 MG PO TABS: 50.0000 mg | ORAL_TABLET | Freq: Four times a day (QID) | ORAL | Status: AC | PRN

## 2011-06-21 NOTE — Progress Notes (Signed)
Pt d/c'd to home via w/c with wife.  Escorted to door with staff after d/c paperwork given and explained.  Stated understanding.

## 2011-06-21 NOTE — Discharge Instructions (Signed)
PLEASE REMEMBER TO BRING ALL OF YOUR MEDICATIONS TO EACH OF YOUR FOLLOW-UP OFFICE VISITS.  PLEASE TAKE ALL NEW MEDICATIONS/MEDICATION CHANGES AS PRESCRIBED.   PLEASE FOLLOW-UP AS SCHEDULED.   Activity: Increase activity slowly as tolerated. You may shower, but no soaking baths for 4 days. You may drive (if applicable). No lifting over 5 lbs for 4 days. No sexual activity for 4 days.   You May Return to Work: in 4 days (if applicable)  Wound Care: You may wash cath site gently with soap and water. Keep cath site clean and dry. If you notice pain, swelling, bleeding or pus at your cath site, please call [phone].  Arterial Hypertension Arterial hypertension (high blood pressure) is a condition of elevated pressure in your blood vessels. Hypertension over a long period of time is a risk factor for strokes, heart attacks, and heart failure. It is also the leading cause of kidney (renal) failure.     CAUSES   In Adults -- Over 90% of all hypertension has no known cause. This is called essential or primary hypertension. In the other 10% of people with hypertension, the increase in blood pressure is caused by another disorder. This is called secondary hypertension. Important causes of secondary hypertension are:   Heavy alcohol use.   Obstructive sleep apnea.   Hyperaldosterosim (Conn's syndrome).   Steroid use.   Chronic kidney failure.   Hyperparathyroidism.   Medications.   Renal artery stenosis.   Pheochromocytoma.   Cushing's disease.   Coarctation of the aorta.   Scleroderma renal crisis.   Licorice (in excessive amounts).   Drugs (cocaine, methamphetamine).  Your caregiver can explain any items above that apply to you.  In Children -- Secondary hypertension is more common and should always be considered.   Pregnancy -- Few women of childbearing age have high blood pressure. However, up to 10% of them develop hypertension of pregnancy. Generally, this will not harm  the woman. It may be a sign of 3 complications of pregnancy: preeclampsia, HELLP syndrome, and eclampsia. Follow up and control with medication is necessary.  SYMPTOMS   This condition normally does not produce any noticeable symptoms. It is usually found during a routine exam.   Malignant hypertension is a late problem of high blood pressure. It may have the following symptoms:   Headaches.   Blurred vision.   End-organ damage (this means your kidneys, heart, lungs, and other organs are being damaged).   Stressful situations can increase the blood pressure. If a person with normal blood pressure has their blood pressure go up while being seen by their caregiver, this is often termed "white coat hypertension." Its importance is not known. It may be related with eventually developing hypertension or complications of hypertension.   Hypertension is often confused with mental tension, stress, and anxiety.  DIAGNOSIS  The diagnosis is made by 3 separate blood pressure measurements. They are taken at least 1 week apart from each other. If there is organ damage from hypertension, the diagnosis may be made without repeat measurements. Hypertension is usually identified by having blood pressure readings:  Above 140/90 mmHg measured in both arms, at 3 separate times, over a couple weeks.   Over 130/80 mmHg should be considered a risk factor and may require treatment in patients with diabetes.  Blood pressure readings over 120/80 mmHg are called "pre-hypertension" even in non-diabetic patients. To get a true blood pressure measurement, use the following guidelines. Be aware of the factors that can alter  blood pressure readings.  Take measurements at least 1 hour after caffeine.   Take measurements 30 minutes after smoking and without any stress. This is another reason to quit smoking - it raises your blood pressure.   Use a proper cuff size. Ask your caregiver if you are not sure about your cuff  size.   Most home blood pressure cuffs are automatic. They will measure systolic and diastolic pressures. The systolic pressure is the pressure reading at the start of sounds. Diastolic pressure is the pressure at which the sounds disappear. If you are elderly, measure pressures in multiple postures. Try sitting, lying or standing.   Sit at rest for a minimum of 5 minutes before taking measurements.   You should not be on any medications like decongestants. These are found in many cold medications.   Record your blood pressure readings and review them with your caregiver.  If you have hypertension:  Your caregiver may do tests to be sure you do not have secondary hypertension (see "causes" above).   Your caregiver may also look for signs of metabolic syndrome. This is also called Syndrome X or Insulin Resistance Syndrome. You may have this syndrome if you have type 2 diabetes, abdominal obesity, and abnormal blood lipids in addition to hypertension.   Your caregiver will take your medical and family history and perform a physical exam.   Diagnostic tests may include blood tests (for glucose, cholesterol, potassium, and kidney function), a urinalysis, or an EKG. Other tests may also be necessary depending on your condition.  PREVENTION  There are important lifestyle issues that you can adopt to reduce your chance of developing hypertension:  Maintain a normal weight.   Limit the amount of salt (sodium) in your diet.   Exercise often.   Limit alcohol intake.   Get enough potassium in your diet. Discuss specific advice with your caregiver.   Follow a DASH diet (dietary approaches to stop hypertension). This diet is rich in fruits, vegetables, and low-fat dairy products, and avoids certain fats.  PROGNOSIS  Essential hypertension cannot be cured. Lifestyle changes and medical treatment can lower blood pressure and reduce complications. The prognosis of secondary hypertension depends on  the underlying cause. Many people whose hypertension is controlled with medicine or lifestyle changes can live a normal, healthy life.  RISKS AND COMPLICATIONS  While high blood pressure alone is not an illness, it often requires treatment due to its short- and long-term effects on many organs. Hypertension increases your risk for:  CVAs or strokes (cerebrovascular accident).   Heart failure due to chronically high blood pressure (hypertensive cardiomyopathy).   Heart attack (myocardial infarction).   Damage to the retina (hypertensive retinopathy).   Kidney failure (hypertensive nephropathy).  Your caregiver can explain list items above that apply to you. Treatment of hypertension can significantly reduce the risk of complications. TREATMENT   For overweight patients, weight loss and regular exercise are recommended. Physical fitness lowers blood pressure.   Mild hypertension is usually treated with diet and exercise. A diet rich in fruits and vegetables, fat-free dairy products, and foods low in fat and salt (sodium) can help lower blood pressure. Decreasing salt intake decreases blood pressure in a 1/3 of people.   Stop smoking if you are a smoker.  The steps above are highly effective in reducing blood pressure. While these actions are easy to suggest, they are difficult to achieve. Most patients with moderate or severe hypertension end up requiring medications to bring their  blood pressure down to a normal level. There are several classes of medications for treatment. Blood pressure pills (antihypertensives) will lower blood pressure by their different actions. Lowering the blood pressure by 10 mmHg may decrease the risk of complications by as much as 25%. The goal of treatment is effective blood pressure control. This will reduce your risk for complications. Your caregiver will help you determine the best treatment for you according to your lifestyle. What is excellent treatment for one  person, may not be for you. HOME CARE INSTRUCTIONS   Do not smoke.   Follow the lifestyle changes outlined in the "Prevention" section.   If you are on medications, follow the directions carefully. Blood pressure medications must be taken as prescribed. Skipping doses reduces their benefit. It also puts you at risk for problems.   Follow up with your caregiver, as directed.   If you are asked to monitor your blood pressure at home, follow the guidelines in the "Diagnosis" section above.  SEEK MEDICAL CARE IF:   You think you are having medication side effects.   You have recurrent headaches or lightheadedness.   You have swelling in your ankles.   You have trouble with your vision.  SEEK IMMEDIATE MEDICAL CARE IF:   You have sudden onset of chest pain or pressure, difficulty breathing, or other symptoms of a heart attack.   You have a severe headache.   You have symptoms of a stroke (such as sudden weakness, difficulty speaking, difficulty walking).  MAKE SURE YOU:   Understand these instructions.   Will watch your condition.   Will get help right away if you are not doing well or get worse.  Document Released: 12/24/2004 Document Revised: 12/13/2010 Document Reviewed: 07/24/2006 [facility] Patient Information 2012 [place].

## 2011-06-21 NOTE — Discharge Summary (Signed)
Discharge Summary   Patient ID: Gary Yates,  MRN: [id number], DOB/AGE: 09-29-1933 78 y.o.  Admit date: 06/18/2011 Discharge date: 06/21/2011  Discharge Diagnoses Principal Problem:   *Midsternal chest pain  - Ongoing chest pain despite NTG with new RBBB on EKG  - Emergent cath -->nonobstrtuctive LAD disease, RCA stents widely patent; LVEF 55-65%  - Medical management recommended   - D-dimer WNL; cardiac biomarker trend:   06/18/2011 20:23 06/18/2011 22:31 06/19/2011 04:34 06/19/2011 10:27  CK, MB  4.6 (H) 4.3 (H) 4.3 (H)  CK Total  209 183 176  Troponin I  <0.30 <0.30 <0.30  Troponin i, poc 0.00        - 2D echo: LVEF 45-50%, mild LVH, distal septal apical and inferobasal hypokinesis  - Suspected in setting of PAF prior to admission    Active Problems:   Paroxysmal atrial fibrillation  - Per EP consult  - Torpol-XL up-titrated to 100mg  daily for rate-control  - Xarelto 20mg  daily (CrCl 70) for anticoagulation   DIABETES MELLITUS, TYPE II  - Hgb 9.1 this admission  - On Lantus BID dosing and glimepiride outpatient, per PCP management   HYPERLIPIDEMIA  - History of hyperTGemia  - Lipid panel    06/20/2011 05:52  Cholesterol 171  Triglycerides 327 (H)  HDL 25 (L)  LDL (calc) 81  VLDL 65 (H)  Total CHOL/HDL Ratio 6.8     - Continued on statin, fibrate   G E R D  - stable on PPI   Diabetic peripheral neuropathy  - Likely contributing to the patient's complaint of incoordination/imbalance   - Neurontin increased by PCP recently, continued   Chronic kidney disease  - stage III, likely as a sequelae of type 2 DM   Coronary artery disease  - Cath as above  - Continued on ASA/ACEi/BB/statin/NTG SL PRN    Hypertension  - Well-controlled this admission   COPD (chronic obstructive pulmonary disease)  - Stable on Spiriva   Upper extremity weakness  - CT and MRI as below   - Findings on MRI likely artifact per neuro assessment  - Neuro consult: continue ASA  81mg  daily, continue DM management, continue neurontin, follow-up PCP  - Resolved near discharge  Allergies Allergies  Allergen Reactions  . Ace Inhibitors     REACTION: cough  . Codeine Nausea Only    REACTION: rash  . Penicillins Rash    Diagnostic Studies/Procedures  Cardiac catheterization- 06/18/11  Procedure: Left Heart Cath, Selective Coronary Angiography, LV angiography  Indication: 78 year old white male with history of coronary disease status post stenting of the right coronary in 2004. He presents with symptoms of refractory chest pain. His ECG shows a wide a right bundle branch block. He was brought emergently to the cardiac catheterization laboratory.  Procedural details: We initially gained access via the right radial artery using standard Seldinger technique and a 6 French sheath was placed. However in an attempt to pass the catheter into the descending aorta the patient was found to have arterial lusoria. Using 2 different catheters we were unable to get the wire to track into the ascending aorta. Rather than spending a great deal of time trying to gain access we proceeded with femoral access. The right groin was prepped, draped, and anesthetized with 1% lidocaine. Using modified Seldinger technique, a 6 French sheath was introduced into the right femoral artery. Standard Judkins catheters were used for coronary angiography and left ventriculography. Catheter exchanges were performed over a guidewire. There were no  immediate procedural complications. The patient was transferred to the post catheterization recovery area for further monitoring.  Procedural Findings:  Hemodynamics:  AO 124/71 with a mean of 95 mmHg  LV 126/16 mmHg  Coronary angiography:  Coronary dominance: right  Left mainstem: The left main coronary was normal.  Left anterior descending (LAD): The left anterior descending artery starts as one main branch. It then trifurcates into a large septal branch, a  large diagonal branch, and then the continuation of the LAD to the apex. There is then a second diagonal branch. At the trifurcation there appears to be 50% narrowing. In the mid LAD following the second diagonal there is a 70% stenosis. The diagonal branches have nonobstructive disease less than 30%.  Left circumflex (LCx): The left circumflex gives rise to a single marginal branch. There is diffuse 30% disease in the proximal to mid vessel.  Right coronary artery (RCA): The right coronary is a dominant vessel. It is stented in the proximal to mid vessel. The stent is widely patent with diffuse 20% in-stent stenosis.  Left ventriculography: Left ventricular systolic function is normal, LVEF is estimated at 55-65%, there is no significant mitral regurgitation  Final Conclusions:  1. Single vessel obstructive coronary disease with a modest stenosis in the mid LAD. The stents in the right coronary widely patent. I do not see any significant lesions that would explain his ongoing resting chest pain.  2. Normal left ventricular function.   2D echocardiogram with contrast- 06/19/11  Study Conclusions  - Left ventricle: Distal septal apical and inferobasal hypokinesis The cavity size was normal. Wall thickness was increased in a pattern of mild LVH. Systolic function was mildly reduced. The estimated ejection fraction was in the range of 45% to 50%. - Aortic valve: Moderately calcified noncoronary cusp - Atrial septum: No defect or patent foramen ovale was Identified.  CT HEAD WITHOUT CONTRAST- 06/19/11  CT HEAD WITHOUT CONTRAST  Technique: Contiguous axial images were obtained from the base of  the skull through the vertex without contrast.  Comparison: None.  Findings: Generalized atrophy. No acute infarct. Negative for  hemorrhage or mass lesion. Calvarium is intact. Visualized  sinuses are clear.  IMPRESSION:  No acute abnormality.  MRI HEAD WITHOUT CONTRAST- 06/20/11 Technique:  Multiplanar, multiecho pulse sequences of the brain and  surrounding structures were obtained according to standard protocol  without intravenous contrast.  Comparison: 06/19/2011 CT. No comparison MR.  Findings: Questionable tiny acute infarct posterior left frontal  lobe (series 3 image 25) versus artifact.  No intracranial hemorrhage.  Mild small vessel disease type changes.  Global atrophy without hydrocephalus.  No intracranial mass lesion detected on this unenhanced exam.  Transverse ligament hypertrophy. No cord compression.  Major intracranial vascular structures are patent. Ectatic  vertebral arteries and basilar artery.  Small air-fluid level in the right maxillary sinus. Mild mucosal  thickening ethmoid sinus air cells. Minimal right frontal sinus  mucosal thickening.  IMPRESSION:  Questionable tiny acute infarct posterior left frontal lobe (series  3 image 25) versus artifact.  Mild small vessel disease type changes.  Global atrophy without hydrocephalus.  History of Present Illness  Mr. Baine is a 78yo male with PMHx significant for the above problem list who was admitted to [facility] on 06/18/11 with c/o of chest pain.   The patient had reportedly went to his grandson's baseball game and had been in his [place], when he suddenly experienced chest pressure and severe diaphoresis. He returned to his car  to get some air, but the continued to experience discomfort. EMS was called, he was given NTG SL x 2 en route. On the monitor, he was noted to be tachycardic (190s) believed to represent SVT, however this non-responsive to Adenosine 6mg . RBBB was also noted.   Upon ED arrival, HR remained elevated. An additional 12mg  of Adenosine was given with HR decrease to 130s. He continued to have chest pain. POC troponin-I was WNL. On EKG review, he was noted to be in sinus tachycardia with new RBBB. The decision was made for emergent cardiac catheterization.   Hospital Course    He was informed, consent and prepped for the procedure which is outlined above. This was notable for nonobstructive LAD disease, patent RCA stenting and preserved EF. He tolerated the procedure well without immediate complications. The recommendation was made to continue aggressive medical therapy.   The following day, the patient and wife reported occasional slurred speech, imbalance and UE weakness. A noncontrast head CT was ordered revealing no acute abnormality.  A 2D echocardiogram was ordered revealing LVEF 45-50%, mild LVH and distal septal apical and inferobasal hypokinesis. TSH returned WNL. D-dimer WNL.   He ambulated well with cardiac rehab. An EP consult was requested. A formal diagnosis of atrial fibrillation was made with recommendations to order MRI given concern of neurologic deficits and need for anticoagulation. This revealed a questionable small acute posterior L frontal lobe infarct. A neuro consult was made. The recommendation was made to continue ASA, neurontin and DM management. MRI finding was suspected to be artifact. UE weakness had resolved and imbalace was attributed to peripheral neuropathy.   Today, the patient was assessed by Dr. Caryl Comes and found to be stable for discharge pending neurology review. Dr. Nicole Kindred with neurology evaluated the patient and also found him to be suitable for discharge today. A recommendation was made for PT follow-up outpatient. UE weakness had resolved. Toprol-XL will be up-titrated to 100mg  daily for rate-control. The patient will be started on Xarelto for anticoagulation (CrCl 70). He will be discharged today. Follow-up with [name], NP in 2-3 weeks, Dr. Angelena Form in 6 weeks as below. 30-day event monitor will be ordered as well. Ambulatory referrals to cardiac rehab and physical therapy have been made. This information has been clearly outlined in the discharge AVS.   Discharge Vitals:  Blood pressure 114/66, pulse 79, temperature 98.2 F  (36.8 C), temperature source Oral, resp. rate 18, height 5\' 10"  (1.778 m), weight 124.603 kg (274 lb 11.2 oz), SpO2 94.00%.   Labs: Recent Labs  Basename 06/20/11 0552 06/18/11 2025   WBC 8.6 7.5   HGB 14.0 16.015.1   HCT 42.2 47.044.6   MCV 89.0 89.0   PLT 190 186   Recent Labs  Basename 06/18/11 2130   DDIMER 0.47    Lab 06/20/11 0552 06/18/11 2300 06/18/11 2025  NA 135 136 139136  K 4.0 4.3 4.24.4  CL 99 102 105102  CO2 25 22 21   BUN 15 17 2018  CREATININE 1.58* 1.59* 1.60*1.56*  CALCIUM 9.5 9.0 9.1  PROT -- 6.3 --  BILITOT -- 0.3 --  ALKPHOS -- 56 --  ALT -- 12 --  AST -- 25 --  AMYLASE -- -- --  LIPASE -- -- --  GLUCOSE 127* 206* [id number]*   Recent Labs  Basename 06/19/11 1027   HGBA1C 9.2*   Recent Labs  Basename 06/19/11 1027 06/19/11 0434 06/18/11 2231   CKTOTAL 176 183 209   CKMB 4.3* 4.3*  4.6*   CKMBINDEX -- -- --   TROPONINI <0.30 <0.30 <0.30   Recent Labs  Basename 06/20/11 0552   CHOL 171   HDL 25*   LDLCALC 81   TRIG 327*   CHOLHDL 6.8   LDLDIRECT --    Basename 06/18/11 2300  TSH 3.132  T4TOTAL --  T3FREE --  THYROIDAB --   Disposition:  Discharge Orders    Future Appointments: Provider: Department: Dept Phone: Center:   07/10/2011 9:15 AM Burnell Blanks, MD [place] [phone] [id number]   09/03/2011 8:45 AM [name], MD [facility] [phone] [facility]     Future Orders Please Complete By Expires   Amb Referral to Cardiac Rehabilitation        Discharge Medications:  Medication List  As of 06/21/2011  2:51 PM   ASK your doctor about these medications         accu-chek multiclix lancets   TEST ONCE DAILY AS DIRECTED      amitriptyline 10 MG tablet   Commonly known as: ELAVIL      aspirin 81 MG tablet      atorvastatin 10 MG tablet   Commonly known as: LIPITOR      CALCIUM + D PO      diphenhydrAMINE 25 MG tablet   Commonly known as: BENADRYL      fenofibrate micronized 134  MG capsule   Commonly known as: LOFIBRA   Take 1 capsule (134 mg total) by mouth daily before breakfast.      gabapentin 300 MG capsule   Commonly known as: NEURONTIN   2 tabs in the AM, 2 tab in the PM and 1 tab midday      glimepiride 4 MG tablet   Commonly known as: AMARYL   Take 1 tablet (4 mg total) by mouth daily before breakfast.      glucose blood test strip   Test BS 3 times daily      insulin glargine 100 UNIT/ML injection   Commonly known as: LANTUS      lisinopril 5 MG tablet   Commonly known as: PRINIVIL,ZESTRIL   Take 1 tablet (5 mg total) by mouth daily.      metoprolol succinate 50 MG 24 hr tablet   Commonly known as: TOPROL-XL      multivitamin per tablet      nitroGLYCERIN 0.4 MG SL tablet   Commonly known as: NITROSTAT      omeprazole 20 MG capsule   Commonly known as: PRILOSEC   Take 1 capsule (20 mg total) by mouth daily.      testosterone cypionate 200 MG/ML injection   Commonly known as: DEPOTESTOTERONE CYPIONATE      tiotropium 18 MCG inhalation capsule   Commonly known as: SPIRIVA   Place 1 capsule (18 mcg total) into inhaler and inhale daily.      VITAMIN D-3 PO           Outstanding Labs/Studies: 30-day event monitor post-discharge  Duration of Discharge Encounter: Greater than 30 minutes including physician time.  Signed, R. Valeria Batman, PA-C 06/21/2011, 2:51 PM

## 2011-06-21 NOTE — Progress Notes (Signed)
Patient Name: Gary Yates      SUBJECTIVE: reviewed neurology note and have spoken with Dr Nicole Kindred who will review UE exam.  After this would begin anticoagulation   Past Medical History  Diagnosis Date  . Coronary artery disease     a. h/o Overlapping stents RCA;  b. 06/2011 Cath: patent stents, nonobs dzs, NL EF.  Marland Kitchen Hypertension   . Osteoarthritis     shoulder  . Restless leg   . Hyperlipidemia   . COPD (chronic obstructive pulmonary disease)   . Nephrolithiasis   . SVT (supraventricular tachycardia)   . Dyspnea   . GERD (gastroesophageal reflux disease)   . DM (diabetes mellitus)     Type 2, peripheral neuropathy.  . Diabetic peripheral neuropathy     PHYSICAL EXAM Filed Vitals:   06/20/11 1328 06/20/11 2058 06/21/11 0430 06/21/11 0752  BP: 130/61 112/65 99/56   Pulse: 78 78 71   Temp: 97.6 F (36.4 C) 97.1 F (36.2 C) 97.2 F (36.2 C)   TempSrc:  Oral Oral   Resp: 18 16 16    Height:      Weight:   274 lb 11.2 oz (124.603 kg)   SpO2: 95% 95% 95% 96%    Well developed and nourished in no acute distress HENT normal Neck supple with JVP-flat Clear Regular rate and rhythm, no murmurs or gallops Abd-soft with active BS No Clubbing cyanosis edema Skin-warm and dry A & Oriented  Grossly normal sensory and motor function Weakness evident on exam 2 days ago is not today to either me or patient   TELEMETRY: Reviewed telemetry pt in nsr:    Intake/Output Summary (Last 24 hours) at 06/21/11 1054 Last data filed at 06/20/11 1700  Gross per 24 hour  Intake    480 ml  Output    500 ml  Net    -20 ml    LABS: Basic Metabolic Panel:  Lab [id number] 06/18/11 2300 06/18/11 2025  NA 135 136 139136  K 4.0 4.3 4.24.4  CL 99 102 105102  CO2 25 22 21   GLUCOSE 127* 206* 267*264*  BUN 15 17 2018  CREATININE 1.58* 1.59* 1.60*1.56*  CALCIUM 9.5 9.0 --  MG -- -- --  PHOS -- -- --   Cardiac Enzymes:  Basename 06/19/11 1027 06/19/11 0434 06/18/11 2231    CKTOTAL 176 183 209  CKMB 4.3* 4.3* 4.6*  CKMBINDEX -- -- --  TROPONINI <0.30 <0.30 <0.30   CBC:  Lab 06/20/11 0552 06/18/11 2025  WBC 8.6 7.5  NEUTROABS -- 4.0  HGB 14.0 16.015.1  HCT 42.2 47.044.6  MCV 89.0 89.0  PLT 190 186   PROTIME:  Basename 06/18/11 2300 06/18/11 2025  LABPROT 14.0 13.6  INR 1.06 1.02   Liver Function Tests:  Basename 06/18/11 2300 06/18/11 2025  AST 25 34  ALT 12 13  ALKPHOS 56 51  BILITOT 0.3 0.3  PROT 6.3 6.6  ALBUMIN 3.2* 3.3*   No results found for this basename: LIPASE:2,AMYLASE:2 in the last 72 hours BNP: No components found with this basename: POCBNP:3 D-Dimer:  Basename 06/18/11 2130  DDIMER 0.47   Hemoglobin A1C:  Basename 06/19/11 1027  HGBA1C 9.2*   Fasting Lipid Panel:  Basename 06/20/11 0552  CHOL 171  HDL 25*  LDLCALC 81  TRIG 327*  CHOLHDL 6.8  LDLDIRECT --   Thyroid Function Tests:  Basename 06/18/11 2300  TSH 3.132  T4TOTAL --  T3FREE --  THYROIDAB --  ASSESSMENT AND PLAN:  Patient Active Hospital Problem List:    Atrial fibrillation--begin Rivaroxaban Cr Cl about 70 cc Increase metoprolol to 100 mg daily  Transient right upper extremity weakness=-per neuro  I am impressed taht it seems to have resolved completely  CAD continue  GERD  DM     Anticipate discharge later today with f/u SW 2-3 wks CM 6 wks 30 day event recorder  To assess burden of afib    Signed, Virl Axe MD  06/21/2011

## 2011-06-21 NOTE — Progress Notes (Signed)
Subjective: No complaints. No recurrence of left upper extremity weakness including no hand weakness.  Objective: Current vital signs: BP 114/66  Pulse 79  Temp 98.2 F (36.8 C) (Oral)  Resp 18  Ht 5\' 10"  (1.778 m)  Wt 124.603 kg (274 lb 11.2 oz)  BMI 39.42 kg/m2  SpO2 94%  Neurologic Exam: Alert and in no acute distress. Oriented x3. Speech is normal. Strength of his upper extremities was normal proximally and distally including intrinsic hand muscles bilaterally. I was unable to pull a credit card from his right hand pincer grip. Strength of his first dorsal interosseous, abductor digiti quinti and opponens pollicis muscles was normal bilaterally. There was no intrinsic hand muscle atrophy on either side. Strength of his lower extremities was also normal except for 5-/5 strength of his tibialis anterior muscle on the left, and 4/5 on the right.  Medications:  Scheduled:   . amitriptyline  20 mg Oral QHS  . aspirin EC  81 mg Oral Daily  . atorvastatin  10 mg Oral q1800  . fenofibrate  54 mg Oral Daily  . gabapentin  300 mg Oral Q24H  . gabapentin  600 mg Oral BID  . glimepiride  4 mg Oral QAC breakfast  . insulin aspart  0-15 Units Subcutaneous TID WC  . insulin glargine  44 Units Subcutaneous BID  . lisinopril  5 mg Oral Daily  . metoprolol succinate  100 mg Oral Daily  . pantoprazole  40 mg Oral Q1200  . tiotropium  18 mcg Inhalation Daily  . DISCONTD: metoprolol succinate  50 mg Oral Daily   [id number], diphenhydrAMINE, morphine injection, nitroGLYCERIN, ondansetron (ZOFRAN) IV, traMADol  Assessment/Plan: 1. Diabetic peripheral neuropathy with mild distal lower extremity weakness, right greater than left, in addition to known sensory neuropathy involving lower extremities and affecting his gait. 2. No signs of peripheral neuropathy affecting upper extremities including no weakness proximally nor distally involving right upper extremity. Patient and his wife did  indicate that there are times when when his arthritis flares up and he has difficulty gripping or holding onto objects. 3. No clinical signs of acute stroke nor MRI indications of acute stroke.  Recommendations: 1. Physical therapy evaluation of lower extremity weakness as well as gait evaluation and recommendations regarding ambulatory safety. May be done on an outpatient basis. 2. Optimal control of diabetes mellitus by primary care physician. 3. No further neurological intervention is indicated at this point.  C.R. Nicole Kindred, MD [facility] [phone]  06/21/2011  4:19 PM

## 2011-06-25 ENCOUNTER — Encounter (INDEPENDENT_AMBULATORY_CARE_PROVIDER_SITE_OTHER): Payer: Medicare Other

## 2011-06-25 ENCOUNTER — Telehealth: Payer: Self-pay | Admitting: *Deleted

## 2011-06-25 ENCOUNTER — Ambulatory Visit: Payer: Medicare Other | Admitting: Family Medicine

## 2011-06-25 DIAGNOSIS — I4891 Unspecified atrial fibrillation: Secondary | ICD-10-CM

## 2011-06-25 NOTE — Telephone Encounter (Signed)
Pat, I did not see him day of discharge last week. i was not aware that he had a cough. Can we stop his Lisinopril? If cough does not improve or he develops fevers or chills or SOB, should contact primary care for workup of cough. I do not see a cardiac reason why he would be coughing. Thanks, chris

## 2011-06-25 NOTE — ED Provider Notes (Signed)
History     CSN: [id number]  Arrival date & time 06/18/11  [id number]   First MD Initiated Contact with Patient 06/18/11 1959      Chief Complaint  Patient presents with  . Code STEMI     HPI EMS- pt c/o SOB along with palpatations and chest tightness. Pt HR is 190s, a. Fib with RVR. EMS started IV in Right AC, gave 2 nitro, 4 ASA and 12 of adenosine . HR decreased after adenosine was administered  Past Medical History  Diagnosis Date  . Coronary artery disease     a. h/o Overlapping stents RCA;  b. 06/2011 Cath: patent stents, nonobs dzs, NL EF.  Marland Kitchen Hypertension   . Osteoarthritis     shoulder  . Restless leg   . Hyperlipidemia   . COPD (chronic obstructive pulmonary disease)   . Nephrolithiasis   . SVT (supraventricular tachycardia)   . Dyspnea   . GERD (gastroesophageal reflux disease)   . DM (diabetes mellitus)     Type 2, peripheral neuropathy.  . Diabetic peripheral neuropathy     Past Surgical History  Procedure Date  . Cholecystectomy     Family History  Problem Relation Age of Onset  . Coronary artery disease      Male 1st degree relative <50  . Coronary artery disease      male 1st degree relative <60  . Heart disease Father   . Migraines Father   . Ulcers Father   . Alzheimer's disease Mother   . Heart disease Mother   . Heart disease Sister   . Obesity Sister     Morbid  . Arthritis Sister   . Heart disease Brother   . Arthritis Brother   . Sleep apnea Son   . Obesity Son   . Migraines Daughter   . Thyroid disease Daughter     History  Substance Use Topics  . Smoking status: Former Smoker -- 1.5 packs/day for 20 years    Types: Cigarettes    Quit date: 04/05/1983  . Smokeless tobacco: Never Used  . Alcohol Use: No      Review of Systems  Unable to perform ROS   Allergies  Ace inhibitors; Codeine; and Penicillins  Home Medications   Current Outpatient Rx  Name Route Sig Dispense Refill  . AMITRIPTYLINE HCL 10 MG PO TABS Oral  Take 10 mg by mouth at bedtime.    . ASPIRIN 81 MG PO TABS Oral Take 81 mg by mouth daily.      . ATORVASTATIN CALCIUM 10 MG PO TABS Oral Take 10 mg by mouth daily.    Marland Kitchen CALCIUM + D PO Oral Take by mouth daily.      Marland Kitchen VITAMIN D-3 PO Oral Take by mouth daily.      Marland Kitchen DIPHENHYDRAMINE HCL 25 MG PO TABS Oral Take 25 mg by mouth daily.    . FENOFIBRATE MICRONIZED 134 MG PO CAPS Oral Take 1 capsule (134 mg total) by mouth daily before breakfast. 90 capsule 3  . GABAPENTIN 300 MG PO CAPS  2 tabs in the AM, 2 tab in the PM and 1 tab midday 450 capsule 3  . GLIMEPIRIDE 4 MG PO TABS Oral Take 1 tablet (4 mg total) by mouth daily before breakfast. 90 tablet 4  . GLUCOSE BLOOD VI STRP  Test BS 3 times daily 100 each 6  . INSULIN GLARGINE 100 UNIT/ML [place] SOLN Subcutaneous Inject 44 Units into the skin 2 (two)  times daily.    Marland Kitchen ACCU-CHEK MULTICLIX LANCETS MISC  TEST ONCE DAILY AS DIRECTED 100 each 2  . LISINOPRIL 5 MG PO TABS Oral Take 1 tablet (5 mg total) by mouth daily. 90 tablet 4  . MULTIVITAMINS PO TABS Oral Take 1 tablet by mouth daily.      Marland Kitchen NITROGLYCERIN 0.4 MG SL SUBL Sublingual Place 0.4 mg under the tongue every 5 (five) minutes as needed. Chest pain    . OMEPRAZOLE 20 MG PO CPDR Oral Take 1 capsule (20 mg total) by mouth daily. 90 capsule 3  . TESTOSTERONE CYPIONATE 200 MG/ML IM OIL Intramuscular Inject 200 mg into the muscle every 14 (fourteen) days.    Marland Kitchen TIOTROPIUM BROMIDE MONOHYDRATE 18 MCG IN CAPS Inhalation Place 1 capsule (18 mcg total) into inhaler and inhale daily. 30 capsule 12  . METOPROLOL SUCCINATE ER 100 MG PO TB24 Oral Take 1 tablet (100 mg total) by mouth daily. Take with or immediately following a meal. 30 tablet 3  . RIVAROXABAN 20 MG PO TABS Oral Take 20 mg by mouth daily with supper. 30 tablet 3  . TRAMADOL HCL 50 MG PO TABS Oral Take 1 tablet (50 mg total) by mouth every 6 (six) hours as needed. 28 tablet 0    BP 114/66  Pulse 79  Temp 98.2 F (36.8 C) (Oral)  Resp 18   Ht 5\' 10"  (1.778 m)  Wt 274 lb 11.2 oz (124.603 kg)  BMI 39.42 kg/m2  SpO2 94%  Physical Exam  Nursing note and vitals reviewed. Constitutional: He is oriented to person, place, and time. He appears well-developed and well-nourished. No distress.  HENT:  Head: Normocephalic and atraumatic.  Eyes: Pupils are equal, round, and reactive to light.  Neck: Normal range of motion.  Cardiovascular: Regular rhythm and intact distal pulses.  Tachycardia present.        Gross concern for an ST elevation MI therefore code STEMI was called  Pulmonary/Chest: No respiratory distress.  Abdominal: Normal appearance. He exhibits no distension.  Musculoskeletal: Normal range of motion.  Neurological: He is alert and oriented to person, place, and time. No cranial nerve deficit.  Skin: Skin is warm and dry. No rash noted.  Psychiatric: He has a normal mood and affect. His behavior is normal.    ED Course  Procedures (including critical care time) CRITICAL CARE Performed by: Leonard Schwartz L   Total critical care time: 30 min   Critical care time was exclusive of separately billable procedures and treating other patients.  Critical care was necessary to treat or prevent imminent or life-threatening deterioration.  Critical care was time spent personally by me on the following activities: development of treatment plan with patient and/or surrogate as well as nursing, discussions with consultants, evaluation of patient's response to treatment, examination of patient, obtaining history from patient or surrogate, ordering and performing treatments and interventions, ordering and review of laboratory studies, ordering and review of radiographic studies, pulse oximetry and re-evaluation of patient's condition.  Labs Reviewed  POCT I-STAT, CHEM 8 - Abnormal; Notable for the following:    Creatinine, Ser 1.60 (*)     Glucose, Bld 267 (*)     All other components within normal limits  COMPREHENSIVE  METABOLIC PANEL - Abnormal; Notable for the following:    Glucose, Bld 264 (*)     Creatinine, Ser 1.56 (*)     Albumin 3.3 (*)     GFR calc non Af Amer 41 (*)  GFR calc Af Amer 48 (*)     All other components within normal limits  CARDIAC PANEL(CRET KIN+CKTOT+MB+TROPI) - Abnormal; Notable for the following:    CK, MB 4.6 (*)     All other components within normal limits  CARDIAC PANEL(CRET KIN+CKTOT+MB+TROPI) - Abnormal; Notable for the following:    CK, MB 4.3 (*)     All other components within normal limits  CARDIAC PANEL(CRET KIN+CKTOT+MB+TROPI) - Abnormal; Notable for the following:    CK, MB 4.3 (*)     All other components within normal limits  COMPREHENSIVE METABOLIC PANEL - Abnormal; Notable for the following:    Glucose, Bld 206 (*)     Creatinine, Ser 1.59 (*)     Albumin 3.2 (*)     GFR calc non Af Amer 41 (*)     GFR calc Af Amer 47 (*)     All other components within normal limits  GLUCOSE, CAPILLARY - Abnormal; Notable for the following:    Glucose-Capillary 200 (*)     All other components within normal limits  HEMOGLOBIN A1C - Abnormal; Notable for the following:    Hemoglobin A1C 9.2 (*)     Mean Plasma Glucose 217 (*)     All other components within normal limits  GLUCOSE, CAPILLARY - Abnormal; Notable for the following:    Glucose-Capillary 122 (*)     All other components within normal limits  GLUCOSE, CAPILLARY - Abnormal; Notable for the following:    Glucose-Capillary 118 (*)     All other components within normal limits  BASIC METABOLIC PANEL - Abnormal; Notable for the following:    Glucose, Bld 127 (*)     Creatinine, Ser 1.58 (*)     GFR calc non Af Amer 41 (*)     GFR calc Af Amer 47 (*)     All other components within normal limits  LIPID PANEL - Abnormal; Notable for the following:    Triglycerides 327 (*)     HDL 25 (*)     VLDL 65 (*)     All other components within normal limits  GLUCOSE, CAPILLARY - Abnormal; Notable for the  following:    Glucose-Capillary 127 (*)     All other components within normal limits  GLUCOSE, CAPILLARY - Abnormal; Notable for the following:    Glucose-Capillary 144 (*)     All other components within normal limits  GLUCOSE, CAPILLARY - Abnormal; Notable for the following:    Glucose-Capillary 241 (*)     All other components within normal limits  GLUCOSE, CAPILLARY - Abnormal; Notable for the following:    Glucose-Capillary 111 (*)     All other components within normal limits  GLUCOSE, CAPILLARY - Abnormal; Notable for the following:    Glucose-Capillary 188 (*)     All other components within normal limits  GLUCOSE, CAPILLARY - Abnormal; Notable for the following:    Glucose-Capillary 137 (*)     All other components within normal limits  APTT  CBC  PROTIME-INR  POCT I-STAT TROPONIN I  D-DIMER, QUANTITATIVE  PROTIME-INR  TSH  MRSA PCR SCREENING  DIFFERENTIAL  CBC  POCT ACTIVATED CLOTTING TIME  GLUCOSE, CAPILLARY  LAB REPORT - SCANNED   No results found.   1. Coronary artery disease   2. Peripheral neuropathy   3. SVT (supraventricular tachycardia)   4. Chronic kidney disease   5. Diabetic peripheral neuropathy   6. DM (diabetes mellitus)   7. Dyslipidemia  8. Hypertension   9. CAD, NATIVE VESSEL       MDM        Dot Lanes, MD 06/25/11 [phone]

## 2011-06-25 NOTE — Telephone Encounter (Signed)
Left message on home number to call back. Cell number does not have voicemail set up

## 2011-06-25 NOTE — Telephone Encounter (Signed)
Spoke with Gary Yates and gave him instructions from Dr. Angelena Form. He is feeling well except for cough and is asking if he can go to the beach for a family wedding this weekend. No shortness of breath, dizziness or chest pain.  I told him this should be OK

## 2011-06-25 NOTE — Telephone Encounter (Signed)
Pt here to have monitor put on. He reported to Rod Holler that he still has a terrible cough. Pt states Dr. Angelena Form was aware of cough and had told him to contact office if it persisted.  Will review with Dr. Angelena Form and call pt with his recommendations

## 2011-07-01 ENCOUNTER — Telehealth: Payer: Self-pay | Admitting: Cardiovascular Disease

## 2011-07-01 ENCOUNTER — Ambulatory Visit: Payer: Medicare Other | Admitting: Cardiovascular Disease

## 2011-07-01 NOTE — Telephone Encounter (Signed)
Spoke with pt and told him he should keep appt on July 10, 2011 for post hospital follow up.

## 2011-07-01 NOTE — Telephone Encounter (Signed)
I spoke with pharmacist at [facility] and number to call for prior auth is [phone]. I called this number and was given approval.  I called [facility] and gave them this information

## 2011-07-01 NOTE — Telephone Encounter (Signed)
New msg [facility] on [address] wants to know about auth for xeralto Please call

## 2011-07-01 NOTE — Telephone Encounter (Signed)
New msg Pt wants to know should he keep appt on [id number]. Please call

## 2011-07-09 ENCOUNTER — Other Ambulatory Visit: Payer: Self-pay | Admitting: Internal Medicine

## 2011-07-09 ENCOUNTER — Ambulatory Visit: Payer: Medicare Other | Admitting: Cardiovascular Disease

## 2011-07-09 ENCOUNTER — Ambulatory Visit (INDEPENDENT_AMBULATORY_CARE_PROVIDER_SITE_OTHER): Payer: Medicare Other | Admitting: Family Medicine

## 2011-07-09 ENCOUNTER — Encounter: Payer: Self-pay | Admitting: Family Medicine

## 2011-07-09 VITALS — BP 130/62 | Temp 97.8°F | Wt 277.0 lb

## 2011-07-09 DIAGNOSIS — E291 Testicular hypofunction: Secondary | ICD-10-CM

## 2011-07-09 DIAGNOSIS — E119 Type 2 diabetes mellitus without complications: Secondary | ICD-10-CM

## 2011-07-09 DIAGNOSIS — I10 Essential (primary) hypertension: Secondary | ICD-10-CM

## 2011-07-09 DIAGNOSIS — I2510 Atherosclerotic heart disease of native coronary artery without angina pectoris: Secondary | ICD-10-CM

## 2011-07-09 DIAGNOSIS — E349 Endocrine disorder, unspecified: Secondary | ICD-10-CM

## 2011-07-09 MED ORDER — TESTOSTERONE CYPIONATE 200 MG/ML IM SOLN: 200.0000 mg | INTRAMUSCULAR | Status: DC

## 2011-07-09 MED ORDER — ACCU-CHEK MULTICLIX LANCETS MISC: Status: DC

## 2011-07-09 NOTE — Progress Notes (Signed)
Subjective:    Patient ID: Gary Yates, male    DOB: 07/29/1933, 78 y.o.   MRN: [id number]  HPI  Patient seen for hospital followup. He has multiple chronic problems including type 2 diabetes, coronary artery disease, peripheral vascular disease, hypertension, dyslipidemia, COPD, and obesity. He was at his grandson's baseball game and developed some dyspnea and chest pain. Taken by EMS apparently had heart rate 190s with right bundle branch block. He was given adenosine and eventually heart rate reduced. Toprol increased to 100 mg. Patient was felt to be possibly in transient atrial fibrillation started on Xarelto but never started secondary to cost. Ruled out for MI. An emergent catheterization revealed widely patent right coronary artery and ejection fraction 55-65%. He had no dyspnea or chest pain since then. He is currently wearing a 30 day event monitor and followup with cardiologist this week.  Also enrolled in cardiac rehabilitation.  Patient had some question of right upper extremity weakness on admission. CT head no acute abnormality. MRI question of acute infarct posterior left frontal lobe versus artifact. Neurology consult felt this was artifact. No weakness since then. A1c 9.2%. Working on weight loss and dietary improvement since then. Fasting blood sugars have improved greatly since discharge. Currently takes Lantus 44 units twice daily. No hypoglycemia.  Past Medical History  Diagnosis Date  . Coronary artery disease     a. h/o Overlapping stents RCA;  b. 06/2011 Cath: patent stents, nonobs dzs, NL EF.  Marland Kitchen Hypertension   . Osteoarthritis     shoulder  . Restless leg   . Hyperlipidemia   . COPD (chronic obstructive pulmonary disease)   . Nephrolithiasis   . SVT (supraventricular tachycardia)   . Dyspnea   . GERD (gastroesophageal reflux disease)   . DM (diabetes mellitus)     Type 2, peripheral neuropathy.  . Diabetic peripheral neuropathy    Past Surgical History  Procedure  Date  . Cholecystectomy     reports that he quit smoking about 28 years ago. His smoking use included Cigarettes. He has a 30 pack-year smoking history. He has never used smokeless tobacco. He reports that he does not drink alcohol or use illicit drugs. family history includes Alzheimer's disease in his mother; Arthritis in his brother and sister; Coronary artery disease in some unspecified family members; Heart disease in his brother, father, mother, and sister; Migraines in his daughter and father; Obesity in his sister and son; Sleep apnea in his son; Thyroid disease in his daughter; and Ulcers in his father. Allergies  Allergen Reactions  . Ace Inhibitors     REACTION: cough  . Codeine Nausea Only    REACTION: rash  . Penicillins Rash      Review of Systems  Constitutional: Negative for appetite change.  Respiratory: Negative for wheezing.   Cardiovascular: Negative for chest pain and palpitations.  Neurological: Negative for dizziness, syncope and weakness.  Psychiatric/Behavioral: Negative for dysphoric mood.       Objective:   Physical Exam  Constitutional: He appears well-developed and well-nourished.  Neck: Neck supple. No thyromegaly present.  Cardiovascular: Normal rate and regular rhythm.  Exam reveals no gallop.   Pulmonary/Chest: Effort normal and breath sounds normal. No respiratory distress. He has no wheezes. He has no rales.  Musculoskeletal: He exhibits no edema.  Lymphadenopathy:    He has no cervical adenopathy.          Assessment & Plan:  #1 type 2 diabetes. Poor control but improving by home  readings over the past couple of weeks. History of poor compliance. Discussed short-acting mealtime insulin but at this point he wishes to work on weight loss and reassess A1c in August.  Future order created. #2 recent chest pain. Ruled out for MI. Catheterization no obstructive CAD. #3 history of recent tachyarrhythmia. Question transient nature fibrillation.  Wearing an event monitor and followup with cardiology later this week.  No recurrent symptoms. #4 Hypogonadism.  Received injection today and new script given for refills.

## 2011-07-09 NOTE — Patient Instructions (Addendum)
Continue weight loss efforts.

## 2011-07-10 ENCOUNTER — Ambulatory Visit (INDEPENDENT_AMBULATORY_CARE_PROVIDER_SITE_OTHER): Payer: Medicare Other | Admitting: Cardiovascular Disease

## 2011-07-10 ENCOUNTER — Encounter: Payer: Self-pay | Admitting: Cardiovascular Disease

## 2011-07-10 VITALS — BP 131/79 | HR 86 | Ht 70.0 in | Wt 273.0 lb

## 2011-07-10 DIAGNOSIS — I480 Paroxysmal atrial fibrillation: Secondary | ICD-10-CM

## 2011-07-10 DIAGNOSIS — I2510 Atherosclerotic heart disease of native coronary artery without angina pectoris: Secondary | ICD-10-CM

## 2011-07-10 DIAGNOSIS — I4891 Unspecified atrial fibrillation: Secondary | ICD-10-CM

## 2011-07-10 MED ORDER — WARFARIN SODIUM 5 MG PO TABS: 5.0000 mg | ORAL_TABLET | Freq: Every day | ORAL | Status: DC

## 2011-07-10 NOTE — Assessment & Plan Note (Signed)
Stable with recent cath last month. No chest pain. Will continue medical therapy.

## 2011-07-10 NOTE — Progress Notes (Signed)
History of Present Illness: 78 yo WM with history of HTN, hyperlipidemia, morbid obesity, DM, CAD and obstructive lung disease here for hospital follow up.   He was admitted to [name] 06/18/11 with chest pain. Cardiac cath on 06/18/11 with moderate LAD disease with patent RCA stents. No flow limiting lesions. Normal LVEF. D Dimer and CE were normal. He was also noted to have tachycardia which was felt to be atrial fibirllation. Dr. Caryl Comes saw him in the hospital and started Xarelto for anticoagulation and increased metoprolol for rate control. Slurred speech and seen by Neuro. Head CT without evidence of CVA. Head MRI with questionable tiny acute infarct posterior left frontal lobe, mild small vessel disease type changes, global atrophy without hydrocephalus. Carotid artery dopplers 08/07/10 with mild (0-39%) bilateral ICA stenosis.  He is doing well. He denies any chest pains or SOB. He has not noticed any palpitations. He does note fatigue. He did not start Xarelto because of cost. First 6 days of cardiac monitor shows sinus rhythm. Upper extremity weakness resolved. No headaches or visual changes.    Primary Care Physician: Carolann Littler  Last Lipid Profile:  Lipid Panel     Component Value Date/Time   CHOL 171 06/20/2011 0552   TRIG 327* 06/20/2011 0552   HDL 25* 06/20/2011 0552   CHOLHDL 6.8 06/20/2011 0552   VLDL 65* 06/20/2011 0552   LDLCALC 81 06/20/2011 0552     Past Medical History  Diagnosis Date  . Coronary artery disease     a. h/o Overlapping stents RCA;  b. 06/2011 Cath: patent stents, nonobs dzs, NL EF.  Marland Kitchen Hypertension   . Osteoarthritis     shoulder  . Restless leg   . Hyperlipidemia   . COPD (chronic obstructive pulmonary disease)   . Nephrolithiasis   . SVT (supraventricular tachycardia)   . Dyspnea   . GERD (gastroesophageal reflux disease)   . DM (diabetes mellitus)     Type 2, peripheral neuropathy.  . Diabetic peripheral neuropathy   . Atrial  fibrillation     Past Surgical History  Procedure Date  . Cholecystectomy     Current Outpatient Prescriptions  Medication Sig Dispense Refill  . amitriptyline (ELAVIL) 10 MG tablet Take 10 mg by mouth at bedtime.      Marland Kitchen aspirin 81 MG tablet Take 81 mg by mouth daily.        Marland Kitchen atorvastatin (LIPITOR) 10 MG tablet Take 10 mg by mouth daily.      . Calcium Carbonate-Vitamin D (CALCIUM + D PO) Take by mouth daily.        . diphenhydrAMINE (BENADRYL) 25 MG tablet Take 25 mg by mouth daily.      . fenofibrate micronized (LOFIBRA) 134 MG capsule Take 1 capsule (134 mg total) by mouth daily before breakfast.  90 capsule  3  . gabapentin (NEURONTIN) 300 MG capsule 2 tabs in the AM, 2 tab in the PM and 1 tab midday  450 capsule  3  . glimepiride (AMARYL) 4 MG tablet Take 1 tablet (4 mg total) by mouth daily before breakfast.  90 tablet  4  . glucose blood (ACCU-CHEK AVIVA PLUS) test strip Test BS 3 times daily  100 each  6  . insulin glargine (LANTUS) 100 UNIT/ML injection Inject 44 Units into the skin 2 (two) times daily.      . Lancets (ACCU-CHEK MULTICLIX) lancets Use twice daily as instructed  204 each  3  . lisinopril (PRINIVIL,ZESTRIL)  5 MG tablet       . metoprolol succinate (TOPROL-XL) 100 MG 24 hr tablet Take 1 tablet (100 mg total) by mouth daily. Take with or immediately following a meal.  30 tablet  3  . multivitamin (THERAGRAN) per tablet Take 1 tablet by mouth daily.        . nitroGLYCERIN (NITROSTAT) 0.4 MG SL tablet Place 0.4 mg under the tongue every 5 (five) minutes as needed. Chest pain      . omeprazole (PRILOSEC) 20 MG capsule Take 1 capsule (20 mg total) by mouth daily.  90 capsule  3  . rivaroxaban 20 MG TABS Take 20 mg by mouth daily with supper.  30 tablet  3  . testosterone cypionate (DEPOTESTOTERONE CYPIONATE) 200 MG/ML injection Inject 1 mL (200 mg total) into the muscle every 14 (fourteen) days.  10 mL  0  . tiotropium (SPIRIVA HANDIHALER) 18 MCG inhalation capsule  Place 1 capsule (18 mcg total) into inhaler and inhale daily.  30 capsule  12  . traMADol (ULTRAM) 50 MG tablet       . DISCONTD: metFORMIN (GLUCOPHAGE) 500 MG tablet Take 1 tablet (500 mg total) by mouth 2 (two) times daily with a meal.  60 tablet  1  . DISCONTD: metoprolol (LOPRESSOR) 50 MG tablet       . DISCONTD: spironolactone (ALDACTONE) 25 MG tablet       . DISCONTD: testosterone cypionate (DEPO-TESTOSTERONE) 200 MG/ML injection Inject 1 mL (200 mg total) into the muscle every 28 (twenty-eight) days.  1 mL  5    Allergies  Allergen Reactions  . Ace Inhibitors     REACTION: cough  . Codeine Nausea Only    REACTION: rash  . Penicillins Rash    History   Social History  . Marital Status: Married    Spouse Name: N/A    Number of Children: 3  . Years of Education: N/A   Occupational History  . Retired    Social History Main Topics  . Smoking status: Former Smoker -- 1.5 packs/day for 20 years    Types: Cigarettes    Quit date: 04/05/1983  . Smokeless tobacco: Never Used  . Alcohol Use: No  . Drug Use: No  . Sexually Active: Not on file   Other Topics Concern  . Not on file   Social History Narrative  . No narrative on file    Family History  Problem Relation Age of Onset  . Coronary artery disease      Male 1st degree relative <50  . Coronary artery disease      male 1st degree relative <60  . Heart disease Father   . Migraines Father   . Ulcers Father   . Alzheimer's disease Mother   . Heart disease Mother   . Heart disease Sister   . Obesity Sister     Morbid  . Arthritis Sister   . Heart disease Brother   . Arthritis Brother   . Sleep apnea Son   . Obesity Son   . Migraines Daughter   . Thyroid disease Daughter     Review of Systems:  As stated in the HPI and otherwise negative.   BP 131/79  Pulse 86  Ht 5\' 10"  (1.778 m)  Wt 273 lb (123.832 kg)  BMI 39.17 kg/m2  Physical Examination: General: Well developed, well nourished,  NAD HEENT: OP clear, mucus membranes moist SKIN: warm, dry. No rashes. Neuro: No focal deficits Musculoskeletal: Muscle  strength 5/5 all ext Psychiatric: Mood and affect normal Neck: No JVD, no carotid bruits, no thyromegaly, no lymphadenopathy. Lungs:Clear bilaterally, no wheezes, rhonci, crackles Cardiovascular: Regular rate and rhythm. No murmurs, gallops or rubs. Abdomen:Soft. Bowel sounds present. Non-tender.  Extremities: No lower extremity edema. Pulses are 1 + in the bilateral DP/PT.

## 2011-07-10 NOTE — Patient Instructions (Signed)
Your physician recommends that you schedule a follow-up appointment in: 3 months   You have been referred to the coumadin clinic in our office.  Please schedule first appt for July 8 or 9  Your physician has recommended you make the following change in your medication: Do not take Xarelto.  Start Coumadin 5 mg by  Mouth daily

## 2011-07-10 NOTE — Assessment & Plan Note (Addendum)
He is maintaining NSR. Monitor shows sinus for last 6 days. He did not want to start Xarelto due to cost. Will start coumadin and have him establish in the coumadin clinic next week. Will continue Toprol at current dose. Will call with results of event monitor when completed.

## 2011-07-15 ENCOUNTER — Ambulatory Visit (INDEPENDENT_AMBULATORY_CARE_PROVIDER_SITE_OTHER): Payer: Medicare Other | Admitting: *Deleted

## 2011-07-15 DIAGNOSIS — I4891 Unspecified atrial fibrillation: Secondary | ICD-10-CM

## 2011-07-15 DIAGNOSIS — Z7901 Long term (current) use of anticoagulants: Secondary | ICD-10-CM | POA: Insufficient documentation

## 2011-07-15 DIAGNOSIS — I480 Paroxysmal atrial fibrillation: Secondary | ICD-10-CM

## 2011-07-19 ENCOUNTER — Ambulatory Visit (INDEPENDENT_AMBULATORY_CARE_PROVIDER_SITE_OTHER): Payer: Medicare Other | Admitting: Pharmacist

## 2011-07-19 DIAGNOSIS — I480 Paroxysmal atrial fibrillation: Secondary | ICD-10-CM

## 2011-07-19 DIAGNOSIS — I4891 Unspecified atrial fibrillation: Secondary | ICD-10-CM

## 2011-07-19 DIAGNOSIS — Z7901 Long term (current) use of anticoagulants: Secondary | ICD-10-CM

## 2011-07-23 ENCOUNTER — Ambulatory Visit: Payer: Medicare Other | Admitting: Family Medicine

## 2011-07-23 ENCOUNTER — Telehealth: Payer: Self-pay | Admitting: *Deleted

## 2011-07-23 NOTE — Telephone Encounter (Signed)
Pt rn call to pat, pls call [phone]

## 2011-07-23 NOTE — Telephone Encounter (Signed)
I called pt to review monitor results. Left message to call back

## 2011-07-23 NOTE — Telephone Encounter (Signed)
Spoke with pt and reviewed monitor results with him

## 2011-07-24 ENCOUNTER — Ambulatory Visit (INDEPENDENT_AMBULATORY_CARE_PROVIDER_SITE_OTHER): Payer: Medicare Other | Admitting: Family Medicine

## 2011-07-24 DIAGNOSIS — Z8639 Personal history of other endocrine, nutritional and metabolic disease: Secondary | ICD-10-CM

## 2011-07-24 DIAGNOSIS — Z862 Personal history of diseases of the blood and blood-forming organs and certain disorders involving the immune mechanism: Secondary | ICD-10-CM

## 2011-07-24 MED ORDER — TESTOSTERONE CYPIONATE 200 MG/ML IM SOLN
200.0000 mg | INTRAMUSCULAR | Status: DC
  Administered 2011-07-24: 200 mg via INTRAMUSCULAR

## 2011-07-25 ENCOUNTER — Ambulatory Visit (INDEPENDENT_AMBULATORY_CARE_PROVIDER_SITE_OTHER): Payer: Medicare Other | Admitting: *Deleted

## 2011-07-25 DIAGNOSIS — I4891 Unspecified atrial fibrillation: Secondary | ICD-10-CM

## 2011-07-25 DIAGNOSIS — I480 Paroxysmal atrial fibrillation: Secondary | ICD-10-CM

## 2011-07-25 DIAGNOSIS — Z7901 Long term (current) use of anticoagulants: Secondary | ICD-10-CM

## 2011-07-25 LAB — POCT INR: INR: 2.8

## 2011-07-29 ENCOUNTER — Other Ambulatory Visit: Payer: Self-pay | Admitting: General Practice

## 2011-07-29 ENCOUNTER — Other Ambulatory Visit: Payer: Self-pay | Admitting: Cardiovascular Disease

## 2011-07-29 DIAGNOSIS — I480 Paroxysmal atrial fibrillation: Secondary | ICD-10-CM

## 2011-07-29 MED ORDER — WARFARIN SODIUM 5 MG PO TABS: 5.0000 mg | ORAL_TABLET | ORAL | Status: DC

## 2011-07-31 ENCOUNTER — Ambulatory Visit: Payer: Medicare Other | Admitting: Cardiovascular Disease

## 2011-08-01 ENCOUNTER — Ambulatory Visit (INDEPENDENT_AMBULATORY_CARE_PROVIDER_SITE_OTHER): Payer: Medicare Other | Admitting: *Deleted

## 2011-08-01 DIAGNOSIS — Z7901 Long term (current) use of anticoagulants: Secondary | ICD-10-CM

## 2011-08-01 DIAGNOSIS — I4891 Unspecified atrial fibrillation: Secondary | ICD-10-CM

## 2011-08-01 DIAGNOSIS — I480 Paroxysmal atrial fibrillation: Secondary | ICD-10-CM

## 2011-08-01 LAB — POCT INR: INR: 4.9

## 2011-08-07 ENCOUNTER — Ambulatory Visit: Payer: Medicare Other | Admitting: Family Medicine

## 2011-08-07 DIAGNOSIS — Z8639 Personal history of other endocrine, nutritional and metabolic disease: Secondary | ICD-10-CM

## 2011-08-07 DIAGNOSIS — Z862 Personal history of diseases of the blood and blood-forming organs and certain disorders involving the immune mechanism: Secondary | ICD-10-CM

## 2011-08-07 MED ORDER — TESTOSTERONE CYPIONATE 200 MG/ML IM SOLN
200.0000 mg | INTRAMUSCULAR | Status: DC
  Administered 2011-08-07: 200 mg via INTRAMUSCULAR

## 2011-08-08 ENCOUNTER — Ambulatory Visit (INDEPENDENT_AMBULATORY_CARE_PROVIDER_SITE_OTHER): Payer: Medicare Other | Admitting: Family Medicine

## 2011-08-08 ENCOUNTER — Telehealth: Payer: Self-pay | Admitting: Family Medicine

## 2011-08-08 ENCOUNTER — Encounter: Payer: Self-pay | Admitting: Family Medicine

## 2011-08-08 VITALS — BP 150/78 | Temp 98.8°F | Wt 272.0 lb

## 2011-08-08 DIAGNOSIS — G609 Hereditary and idiopathic neuropathy, unspecified: Secondary | ICD-10-CM

## 2011-08-08 DIAGNOSIS — E1142 Type 2 diabetes mellitus with diabetic polyneuropathy: Secondary | ICD-10-CM

## 2011-08-08 DIAGNOSIS — E1149 Type 2 diabetes mellitus with other diabetic neurological complication: Secondary | ICD-10-CM

## 2011-08-08 MED ORDER — HYDROCODONE-ACETAMINOPHEN 5-325 MG PO TABS: ORAL_TABLET | ORAL | Status: DC

## 2011-08-08 NOTE — Progress Notes (Signed)
  Subjective:    Patient ID: Gary Yates, male    DOB: [birth year], 78 y.o.   MRN: [id number]  HPI  Patient seen with progressive right leg pain. Symptoms are worse at rest actually better with activity. Long history of peripheral neuropathy related to diabetes. Pain is interfering with sleep. He already takes gabapentin 600 mg a morning 300 mg at lunch and 600 mg at night. He describes a throbbing pain. Involves most leg. Never has symptoms above the knee. No low back pain. No urine or stool incontinence. Diabetes control is improving by home readings  Patient has atrial fibrillation on Coumadin with recent INR 4.9. No bleeding complications. Recent chest pains.   Review of Systems  Constitutional: Negative for fever and chills.  Respiratory: Negative for shortness of breath.   Cardiovascular: Negative for chest pain.       Objective:   Physical Exam  Constitutional: He appears well-developed and well-nourished.  Cardiovascular: Normal rate.   Pulmonary/Chest: Effort normal and breath sounds normal. No respiratory distress. He has no wheezes. He has no rales.  Musculoskeletal:       Feet- excellent capillary refill. 2 plus dorsalis pedis and posterior tibial pulse. Right foot is warm to touch. No localized point tenderness involving the foot or leg. Straight leg raise negative.  Neurological:       Full-strength lower extremities.  DT reflexes slightly diminished left knee and left ankle compared to right          Assessment & Plan:  Right leg pain. Suspect neuropathic related diabetes. Titrate gabapentin 600 mg 3 times a day. Limited Vicodin 5/325 mg one to 2 every 6 hours when necessary for severe pain. Patient previously been on amitriptyline but taken off because of arrhythmia risk.

## 2011-08-08 NOTE — Patient Instructions (Signed)
Titrate Gabapentin to 300 mg 2 three times daily Supplement with hydrocodone at night as needed.

## 2011-08-08 NOTE — Telephone Encounter (Signed)
Caller: Eban/Patient; PCP: Carolann Littler; CB#: [id number]; Call regarding Leg Pain;  both legs, but not both at the same time, onset 07/31/11, worse at night, not sleeping at night,  blood sugar 100, no injury noted, has had gradual intermittent tingling to toes All emergent sx for Numbness/Tingling Protocol R/o.  08/08/11 0945 appt scheduled.

## 2011-08-09 ENCOUNTER — Ambulatory Visit (INDEPENDENT_AMBULATORY_CARE_PROVIDER_SITE_OTHER): Payer: Medicare Other | Admitting: Pharmacist

## 2011-08-09 DIAGNOSIS — I4891 Unspecified atrial fibrillation: Secondary | ICD-10-CM

## 2011-08-09 DIAGNOSIS — I480 Paroxysmal atrial fibrillation: Secondary | ICD-10-CM

## 2011-08-09 DIAGNOSIS — Z7901 Long term (current) use of anticoagulants: Secondary | ICD-10-CM

## 2011-08-09 LAB — POCT INR: INR: 2.7

## 2011-08-14 ENCOUNTER — Telehealth: Payer: Self-pay | Admitting: Family Medicine

## 2011-08-14 ENCOUNTER — Encounter: Payer: Self-pay | Admitting: Family Medicine

## 2011-08-14 ENCOUNTER — Ambulatory Visit (INDEPENDENT_AMBULATORY_CARE_PROVIDER_SITE_OTHER): Payer: Medicare Other | Admitting: Family Medicine

## 2011-08-14 VITALS — BP 120/60 | Temp 98.7°F | Wt 275.0 lb

## 2011-08-14 DIAGNOSIS — E119 Type 2 diabetes mellitus without complications: Secondary | ICD-10-CM

## 2011-08-14 DIAGNOSIS — E1142 Type 2 diabetes mellitus with diabetic polyneuropathy: Secondary | ICD-10-CM

## 2011-08-14 DIAGNOSIS — G609 Hereditary and idiopathic neuropathy, unspecified: Secondary | ICD-10-CM

## 2011-08-14 DIAGNOSIS — E1149 Type 2 diabetes mellitus with other diabetic neurological complication: Secondary | ICD-10-CM

## 2011-08-14 MED ORDER — OXYCODONE-ACETAMINOPHEN 7.5-325 MG PO TABS: 1.0000 | ORAL_TABLET | ORAL | Status: AC | PRN

## 2011-08-14 MED ORDER — AMITRIPTYLINE HCL 25 MG PO TABS: 25.0000 mg | ORAL_TABLET | Freq: Every day | ORAL | Status: DC

## 2011-08-14 NOTE — Telephone Encounter (Signed)
Caller: Stylianos/Patient; PCP: Carolann Littler; CB#: [id number]; Call regarding Leg Pains, possible Pinched Nerve; Reports constant leg pain, alternating legs.  Onset: 1000 08/13/11.  Afebrile.  Random blood sugar 2 hrs after breakfast 212 at 0600. Unable to sleep d/t leg pain.  No relief from  Hydrocodone 1-2 q 4 hrs; nothing helped during night of 08/13/11.   Feels leg pain  worse since started Coumadin in July 2013. Pain less apparent when moving around. Seen 08/08/11; MD suspects neuropathy. Reports staggering when walks for several months.  Advised to see MD within 24 hours for new onset of moderate pain that has not improved with home care per Leg Non-Injury Guideline.  Appt scheduled for 1600 08/14/11 with Dr Elease Hashimoto.

## 2011-08-14 NOTE — Progress Notes (Signed)
  Subjective:    Patient ID: Barbara Calcano, male    DOB: 1933/09/25, 78 y.o.   MRN: [id number]  HPI  Patient is seen for followup regarding severe leg pain. Long-standing history of diabetes. Known neuropathy. Recently titrated gabapentin 600 mg 3 times a day. Pain not improved. Currently amitriptyline 20 mg at night. He refuses to try Cymbalta as wife had side effects. Previously tried Lyrica but had problems with cost and possible intolerance. Current pain is 10 out of 10 severity at night. No pain with ambulation. Symptoms are worse at night with rest. Has sharp to burning pain. No ulcerations. Chronic and mild edema which is unchanged.  He has recently supplement with hydrocodone which is not helping his pain. Takes Coumadin so cannot take nonsteroidals. Has previously tried tramadol without Aleve.  Diabetes stable.  Good fasting CBGs-usually around 80-120 but not checking postprandials very often. A1C have show poor control.  Very little exercise.  Past Medical History  Diagnosis Date  . Coronary artery disease     a. h/o Overlapping stents RCA;  b. 06/2011 Cath: patent stents, nonobs dzs, NL EF.  Marland Kitchen Hypertension   . Osteoarthritis     shoulder  . Restless leg   . Hyperlipidemia   . COPD (chronic obstructive pulmonary disease)   . Nephrolithiasis   . SVT (supraventricular tachycardia)   . Dyspnea   . GERD (gastroesophageal reflux disease)   . DM (diabetes mellitus)     Type 2, peripheral neuropathy.  . Diabetic peripheral neuropathy   . Atrial fibrillation    Past Surgical History  Procedure Date  . Cholecystectomy     reports that he quit smoking about 28 years ago. His smoking use included Cigarettes. He has a 30 pack-year smoking history. He has never used smokeless tobacco. He reports that he does not drink alcohol or use illicit drugs. family history includes Alzheimer's disease in his mother; Arthritis in his brother and sister; Coronary artery disease in some unspecified  family members; Heart disease in his brother, father, mother, and sister; Migraines in his daughter and father; Obesity in his sister and son; Sleep apnea in his son; Thyroid disease in his daughter; and Ulcers in his father. Allergies  Allergen Reactions  . Ace Inhibitors     REACTION: cough  . Codeine Nausea Only    REACTION: rash  . Penicillins Rash      Review of Systems  Constitutional: Negative for fever and chills.  Respiratory: Negative for shortness of breath.   Cardiovascular: Negative for chest pain.  Musculoskeletal: Negative for gait problem.       Objective:   Physical Exam  Constitutional: He appears well-developed and well-nourished.  Cardiovascular: Normal rate and regular rhythm.   Pulmonary/Chest: Effort normal and breath sounds normal. No respiratory distress. He has no wheezes. He has no rales.  Musculoskeletal:        Patient has some chronic nonpitting edema lower legs. Good capillary refill. Good distal pulses.          Assessment & Plan:  Bilateral leg pain secondary to peripheral neuropathy. Discussed alternatives such as Cymbalta but he is reluctant to try. Titrate amitriptyline 25 mg at night. Continue gabapentin. Discontinue hydrocodone. Percocet 7.5 mg every 6 hours when necessary pain. He is not a candidate for NSAIDS with age, coumadin,CKD, etc. We also need to tighten diabetes control. He is follow up in a few weeks and recheck A1c then. Consider use of mealtime insulin if A1c not improving

## 2011-08-19 ENCOUNTER — Ambulatory Visit (INDEPENDENT_AMBULATORY_CARE_PROVIDER_SITE_OTHER): Payer: Medicare Other | Admitting: Pharmacist

## 2011-08-19 DIAGNOSIS — I4891 Unspecified atrial fibrillation: Secondary | ICD-10-CM

## 2011-08-19 DIAGNOSIS — I480 Paroxysmal atrial fibrillation: Secondary | ICD-10-CM

## 2011-08-19 DIAGNOSIS — Z7901 Long term (current) use of anticoagulants: Secondary | ICD-10-CM

## 2011-08-20 ENCOUNTER — Other Ambulatory Visit (INDEPENDENT_AMBULATORY_CARE_PROVIDER_SITE_OTHER): Payer: Medicare Other

## 2011-08-20 DIAGNOSIS — E119 Type 2 diabetes mellitus without complications: Secondary | ICD-10-CM

## 2011-08-20 DIAGNOSIS — E349 Endocrine disorder, unspecified: Secondary | ICD-10-CM

## 2011-08-20 DIAGNOSIS — E291 Testicular hypofunction: Secondary | ICD-10-CM

## 2011-08-20 LAB — HEMOGLOBIN A1C: Hgb A1c MFr Bld: 7.5 % — ABNORMAL HIGH (ref 4.6–6.5)

## 2011-08-20 MED ORDER — TESTOSTERONE CYPIONATE 200 MG/ML IM SOLN
200.0000 mg | INTRAMUSCULAR | Status: DC
  Administered 2011-08-20: 200 mg via INTRAMUSCULAR

## 2011-08-20 NOTE — Addendum Note (Signed)
Addended by: Jill Side on: 08/20/2011 09:27 AM   Modules accepted: Orders

## 2011-08-21 NOTE — Progress Notes (Signed)
Quick Note:  Pt informed ______ 

## 2011-08-27 ENCOUNTER — Other Ambulatory Visit: Payer: Medicare Other

## 2011-08-28 ENCOUNTER — Telehealth: Payer: Self-pay | Admitting: Family Medicine

## 2011-08-28 MED ORDER — OXYCODONE-ACETAMINOPHEN 7.5-325 MG PO TABS: 1.0000 | ORAL_TABLET | Freq: Four times a day (QID) | ORAL | Status: DC | PRN

## 2011-08-28 NOTE — Telephone Encounter (Signed)
Patient called stating that he need a refill of his oxycodone. Please assist.

## 2011-08-28 NOTE — Telephone Encounter (Signed)
He should try to avoid regular use. May refill #30.  Make sure he has f/u scheduled.

## 2011-08-29 NOTE — Telephone Encounter (Signed)
Rx ready for pick up. 

## 2011-09-03 ENCOUNTER — Encounter: Payer: Self-pay | Admitting: Family Medicine

## 2011-09-03 ENCOUNTER — Ambulatory Visit (INDEPENDENT_AMBULATORY_CARE_PROVIDER_SITE_OTHER): Payer: Medicare Other | Admitting: Family Medicine

## 2011-09-03 VITALS — BP 150/82 | Temp 98.8°F | Wt 271.0 lb

## 2011-09-03 DIAGNOSIS — E119 Type 2 diabetes mellitus without complications: Secondary | ICD-10-CM

## 2011-09-03 DIAGNOSIS — G629 Polyneuropathy, unspecified: Secondary | ICD-10-CM

## 2011-09-03 DIAGNOSIS — E349 Endocrine disorder, unspecified: Secondary | ICD-10-CM

## 2011-09-03 DIAGNOSIS — I10 Essential (primary) hypertension: Secondary | ICD-10-CM

## 2011-09-03 DIAGNOSIS — G609 Hereditary and idiopathic neuropathy, unspecified: Secondary | ICD-10-CM

## 2011-09-03 DIAGNOSIS — E291 Testicular hypofunction: Secondary | ICD-10-CM

## 2011-09-03 DIAGNOSIS — G2581 Restless legs syndrome: Secondary | ICD-10-CM

## 2011-09-03 MED ORDER — PRAMIPEXOLE DIHYDROCHLORIDE 0.125 MG PO TABS: ORAL_TABLET | ORAL | Status: DC

## 2011-09-03 MED ORDER — TESTOSTERONE CYPIONATE 200 MG/ML IM SOLN
200.0000 mg | Freq: Once | INTRAMUSCULAR | Status: AC
  Administered 2011-09-03: 200 mg via INTRAMUSCULAR

## 2011-09-03 NOTE — Patient Instructions (Addendum)
Restless Legs Syndrome Restless legs syndrome is a movement disorder. It may also be called a sensori-motor disorder.  CAUSES  No one knows what specifically causes restless legs syndrome, but it tends to run in families. It is also more common in people with low iron, in pregnancy, in people who need dialysis, and those with nerve damage (neuropathy).Some medications may make restless legs syndrome worse.Those medications include drugs to treat high blood pressure, some heart conditions, nausea, colds, allergies, and depression. SYMPTOMS Symptoms include uncomfortable sensations in the legs. These leg sensations are worse during periods of inactivity or rest. They are also worse while sitting or lying down. Individuals that have the disorder describe sensations in the legs that feel like:  Pulling.   Drawing.   Crawling.   Worming.   Boring.   Tingling.   Pins and needles.   Prickling.   Pain.  The sensations are usually accompanied by an overwhelming urge to move the legs. Sudden muscle jerks may also occur. Movement provides temporary relief from the discomfort. In rare cases, the arms may also be affected. Symptoms may interfere with going to sleep (sleep onset insomnia). Restless legs syndrome may also be related to periodic limb movement disorder (PLMD). PLMD is another more common motor disorder. It also causes interrupted sleep. The symptoms from PLMD usually occur most often when you are awake. TREATMENT  Treatment for restless legs syndrome is symptomatic. This means that the symptoms are treated.   Massage and cold compresses may provide temporary relief.   Walk, stretch, or take a cold or hot bath.   Get regular exercise and a good night's sleep.   Avoid caffeine, alcohol, nicotine, and medications that can make it worse.   Do activities that provide mental stimulation like discussions, needlework, and video games. These may be helpful if you are not able to walk or  stretch.  Some medications are effective in relieving the symptoms. However, many of these medications have side effects. Ask your caregiver about medications that may help your symptoms. Correcting iron deficiency may improve symptoms for some patients. Document Released: 12/14/2001 Document Revised: 12/13/2010 Document Reviewed: 03/22/2010 [facility] Patient Information 2012 [place].  Try to gradually reduce Percocet use

## 2011-09-03 NOTE — Progress Notes (Signed)
  Subjective:    Patient ID: Gary Yates, male    DOB: Sep 29, 1933, 78 y.o.   MRN: [id number]  HPI  Patient seen for follow up diabetic neuropathy. Symptoms occur mostly at night and at rest. No claudication symptoms. We recently titrated amitriptyline to 25 mg at night. He already takes gabapentin 600 mg 3 times a day.  Has recently taken some Percocet and his pain has been quite severe at times. He is now describing a sensation of uncontrollable movement of legs frequently at night and at rest. Sometimes legs jerked uncontrollably. His pain symptoms are worse at night  Diabetes is improving. Recent fasting blood sugars ranging mostly around 100-120. No hypoglycemia. Recent A1c 7.5%. History of low testosterone. Testosterone levels are now up to normal range over 400. He has lost some additional weight and overall feels better.  Past Medical History  Diagnosis Date  . Coronary artery disease     a. h/o Overlapping stents RCA;  b. 06/2011 Cath: patent stents, nonobs dzs, NL EF.  Marland Kitchen Hypertension   . Osteoarthritis     shoulder  . Restless leg   . Hyperlipidemia   . COPD (chronic obstructive pulmonary disease)   . Nephrolithiasis   . SVT (supraventricular tachycardia)   . Dyspnea   . GERD (gastroesophageal reflux disease)   . DM (diabetes mellitus)     Type 2, peripheral neuropathy.  . Diabetic peripheral neuropathy   . Atrial fibrillation    Past Surgical History  Procedure Date  . Cholecystectomy     reports that he quit smoking about 28 years ago. His smoking use included Cigarettes. He has a 30 pack-year smoking history. He has never used smokeless tobacco. He reports that he does not drink alcohol or use illicit drugs. family history includes Alzheimer's disease in his mother; Arthritis in his brother and sister; Coronary artery disease in some unspecified family members; Heart disease in his brother, father, mother, and sister; Migraines in his daughter and father; Obesity in his  sister and son; Sleep apnea in his son; Thyroid disease in his daughter; and Ulcers in his father. Allergies  Allergen Reactions  . Ace Inhibitors     REACTION: cough  . Codeine Nausea Only    REACTION: rash  . Penicillins Rash      Review of Systems  Constitutional: Negative for fever, chills, appetite change and unexpected weight change.  Respiratory: Negative for cough and shortness of breath.   Cardiovascular: Negative for chest pain.  Musculoskeletal: Negative for arthralgias.       Objective:   Physical Exam  Constitutional: He is oriented to person, place, and time. He appears well-developed and well-nourished.  Cardiovascular: Normal rate and regular rhythm.   Pulmonary/Chest: Effort normal and breath sounds normal. No respiratory distress. He has no wheezes. He has no rales.  Musculoskeletal: He exhibits no edema.  Neurological: He is alert and oriented to person, place, and time.           Assessment & Plan:  Bilateral leg pain. Suspect mostly diabetic polyneuropathy. Question restless leg component by recent symptoms above. Try low-dose Mirapex and titrate.  Type 2 diabetes. Improving control    hypogonadism. Recent levels improved.  Overall improved mood, initiative, and energy levels.

## 2011-09-04 ENCOUNTER — Ambulatory Visit (INDEPENDENT_AMBULATORY_CARE_PROVIDER_SITE_OTHER): Payer: Medicare Other | Admitting: *Deleted

## 2011-09-04 DIAGNOSIS — Z7901 Long term (current) use of anticoagulants: Secondary | ICD-10-CM

## 2011-09-04 DIAGNOSIS — I4891 Unspecified atrial fibrillation: Secondary | ICD-10-CM

## 2011-09-04 DIAGNOSIS — I480 Paroxysmal atrial fibrillation: Secondary | ICD-10-CM

## 2011-09-04 LAB — POCT INR: INR: 3.3

## 2011-09-10 ENCOUNTER — Other Ambulatory Visit: Payer: Self-pay | Admitting: *Deleted

## 2011-09-10 MED ORDER — GABAPENTIN 300 MG PO CAPS: ORAL_CAPSULE | ORAL | Status: DC

## 2011-09-17 ENCOUNTER — Ambulatory Visit (INDEPENDENT_AMBULATORY_CARE_PROVIDER_SITE_OTHER): Payer: Medicare Other | Admitting: Family Medicine

## 2011-09-17 ENCOUNTER — Telehealth: Payer: Self-pay | Admitting: Family Medicine

## 2011-09-17 DIAGNOSIS — Z8639 Personal history of other endocrine, nutritional and metabolic disease: Secondary | ICD-10-CM

## 2011-09-17 DIAGNOSIS — Z862 Personal history of diseases of the blood and blood-forming organs and certain disorders involving the immune mechanism: Secondary | ICD-10-CM

## 2011-09-17 MED ORDER — TESTOSTERONE CYPIONATE 200 MG/ML IM SOLN
200.0000 mg | Freq: Once | INTRAMUSCULAR | Status: AC
  Administered 2011-09-17: 200 mg via INTRAMUSCULAR

## 2011-09-17 NOTE — Telephone Encounter (Signed)
Caller: Gary Yates/Patient; Phone: [phone]; Reason for Call: Patient would like to know if Dr Elease Hashimoto received a form from a diabetic shoe company regarding his diabetic shoes.  Patient needs Dr Elease Hashimoto to complete the form so that he can receive his pair of shoes for the year.

## 2011-09-18 ENCOUNTER — Ambulatory Visit (INDEPENDENT_AMBULATORY_CARE_PROVIDER_SITE_OTHER): Payer: Medicare Other | Admitting: *Deleted

## 2011-09-18 ENCOUNTER — Other Ambulatory Visit: Payer: Self-pay | Admitting: *Deleted

## 2011-09-18 DIAGNOSIS — Z7901 Long term (current) use of anticoagulants: Secondary | ICD-10-CM

## 2011-09-18 DIAGNOSIS — I4891 Unspecified atrial fibrillation: Secondary | ICD-10-CM

## 2011-09-18 DIAGNOSIS — I480 Paroxysmal atrial fibrillation: Secondary | ICD-10-CM

## 2011-09-18 LAB — POCT INR: INR: 3.2

## 2011-09-18 MED ORDER — LISINOPRIL 5 MG PO TABS: 5.0000 mg | ORAL_TABLET | Freq: Every day | ORAL | Status: DC

## 2011-09-18 MED ORDER — OMEPRAZOLE 20 MG PO CPDR: 20.0000 mg | DELAYED_RELEASE_CAPSULE | Freq: Every day | ORAL | Status: DC

## 2011-09-18 MED ORDER — GABAPENTIN 300 MG PO CAPS: ORAL_CAPSULE | ORAL | Status: DC

## 2011-09-18 MED ORDER — FENOFIBRATE MICRONIZED 134 MG PO CAPS: 134.0000 mg | ORAL_CAPSULE | Freq: Every day | ORAL | Status: DC

## 2011-10-01 ENCOUNTER — Other Ambulatory Visit: Payer: Self-pay | Admitting: *Deleted

## 2011-10-01 ENCOUNTER — Ambulatory Visit: Payer: Medicare Other | Admitting: Family Medicine

## 2011-10-01 ENCOUNTER — Other Ambulatory Visit: Payer: Self-pay | Admitting: Family Medicine

## 2011-10-01 MED ORDER — METOPROLOL SUCCINATE ER 100 MG PO TB24: 100.0000 mg | ORAL_TABLET | Freq: Every day | ORAL | Status: DC

## 2011-10-01 NOTE — Telephone Encounter (Signed)
Patient called stating that he need a refill of his metoprolol 100mg  sent [facility]. Please assist.

## 2011-10-08 ENCOUNTER — Other Ambulatory Visit: Payer: Self-pay | Admitting: Family Medicine

## 2011-10-10 ENCOUNTER — Ambulatory Visit (INDEPENDENT_AMBULATORY_CARE_PROVIDER_SITE_OTHER): Payer: Medicare Other | Admitting: Family Medicine

## 2011-10-10 DIAGNOSIS — Z23 Encounter for immunization: Secondary | ICD-10-CM

## 2011-10-10 DIAGNOSIS — Z862 Personal history of diseases of the blood and blood-forming organs and certain disorders involving the immune mechanism: Secondary | ICD-10-CM

## 2011-10-10 DIAGNOSIS — Z8639 Personal history of other endocrine, nutritional and metabolic disease: Secondary | ICD-10-CM

## 2011-10-10 MED ORDER — TESTOSTERONE CYPIONATE 200 MG/ML IM SOLN
200.0000 mg | INTRAMUSCULAR | Status: DC
  Administered 2011-10-10: 200 mg via INTRAMUSCULAR

## 2011-10-17 ENCOUNTER — Ambulatory Visit (INDEPENDENT_AMBULATORY_CARE_PROVIDER_SITE_OTHER): Payer: Medicare Other | Admitting: Cardiovascular Disease

## 2011-10-17 ENCOUNTER — Encounter: Payer: Self-pay | Admitting: Cardiovascular Disease

## 2011-10-17 ENCOUNTER — Ambulatory Visit (INDEPENDENT_AMBULATORY_CARE_PROVIDER_SITE_OTHER): Payer: Medicare Other | Admitting: Pharmacist

## 2011-10-17 VITALS — BP 140/84 | HR 83 | Ht 70.0 in | Wt 272.0 lb

## 2011-10-17 DIAGNOSIS — I4891 Unspecified atrial fibrillation: Secondary | ICD-10-CM

## 2011-10-17 DIAGNOSIS — I2510 Atherosclerotic heart disease of native coronary artery without angina pectoris: Secondary | ICD-10-CM

## 2011-10-17 DIAGNOSIS — I480 Paroxysmal atrial fibrillation: Secondary | ICD-10-CM

## 2011-10-17 DIAGNOSIS — Z7901 Long term (current) use of anticoagulants: Secondary | ICD-10-CM

## 2011-10-17 NOTE — Progress Notes (Signed)
History of Present Illness: 78 yo WM with history of HTN, hyperlipidemia, morbid obesity, DM, CAD, atrial fibrillation and obstructive lung disease here for hospital follow up. He was admitted to [facility] 06/18/11 with chest pain. Cardiac cath on 06/18/11 with moderate LAD disease with patent RCA stents. No flow limiting lesions. Normal LVEF. D Dimer and CE were normal. He was also noted to have tachycardia which was felt to be atrial fibrillation. Dr. Caryl Comes saw him in the hospital and started Xarelto for anticoagulation and increased metoprolol for rate control. Slurred speech and seen by Neuro. Head CT without evidence of CVA. Head MRI with questionable tiny acute infarct posterior left frontal lobe, mild small vessel disease type changes, global atrophy without hydrocephalus. Carotid artery dopplers 08/07/10 with mild (0-39%) bilateral ICA stenosis. He was last seen in our office in July 2013. He decided not to start Xarelto due to cost. He was started on coumadin and has been followed in our coumadin clinic over the last 3 months.   He is doing well. He denies any chest pains or SOB. He has not noticed any palpitations. He has had no bleeding with coumadin.   Primary Care Physician: Carolann Littler  Last Lipid Profile:Lipid Panel     Component Value Date/Time   CHOL 171 06/20/2011 0552   TRIG 327* 06/20/2011 0552   HDL 25* 06/20/2011 0552   CHOLHDL 6.8 06/20/2011 0552   VLDL 65* 06/20/2011 0552   LDLCALC 81 06/20/2011 0552     Past Medical History  Diagnosis Date  . Coronary artery disease     a. h/o Overlapping stents RCA;  b. 06/2011 Cath: patent stents, nonobs dzs, NL EF.  Marland Kitchen Hypertension   . Osteoarthritis     shoulder  . Restless leg   . Hyperlipidemia   . COPD (chronic obstructive pulmonary disease)   . Nephrolithiasis   . SVT (supraventricular tachycardia)   . Dyspnea   . GERD (gastroesophageal reflux disease)   . DM (diabetes mellitus)     Type 2, peripheral  neuropathy.  . Diabetic peripheral neuropathy   . Atrial fibrillation   . Low testosterone     Past Surgical History  Procedure Date  . Cholecystectomy     Current Outpatient Prescriptions  Medication Sig Dispense Refill  . ACCU-CHEK AVIVA PLUS test strip TEST 3 TIMES A DAY  100 strip  11  . amitriptyline (ELAVIL) 25 MG tablet Take 1 tablet (25 mg total) by mouth at bedtime.  30 tablet  6  . aspirin 81 MG tablet Take 81 mg by mouth daily.        Marland Kitchen atorvastatin (LIPITOR) 10 MG tablet Take 10 mg by mouth daily.      . Calcium Carbonate-Vitamin D (CALCIUM + D PO) Take by mouth daily.        . diphenhydrAMINE (BENADRYL) 25 MG tablet Take 25 mg by mouth daily.      . fenofibrate micronized (LOFIBRA) 134 MG capsule Take 1 capsule (134 mg total) by mouth daily before breakfast.  90 capsule  3  . gabapentin (NEURONTIN) 300 MG capsule 2 tabs by mouth 3 times daily  540 capsule  3  . glimepiride (AMARYL) 4 MG tablet Take 1 tablet (4 mg total) by mouth daily before breakfast.  90 tablet  4  . glucose blood (ACCU-CHEK AVIVA PLUS) test strip Test BS 3 times daily  100 each  6  . HYDROcodone-acetaminophen (NORCO/VICODIN) 5-325 MG per tablet       .  insulin glargine (LANTUS) 100 UNIT/ML injection Inject 44 Units into the skin 2 (two) times daily.      . Lancets (ACCU-CHEK MULTICLIX) lancets Use twice daily as instructed  204 each  3  . lisinopril (PRINIVIL,ZESTRIL) 5 MG tablet Take 1 tablet (5 mg total) by mouth daily.  90 tablet  3  . metoprolol succinate (TOPROL-XL) 100 MG 24 hr tablet Take 1 tablet (100 mg total) by mouth daily. Take with or immediately following a meal.  90 tablet  3  . multivitamin (THERAGRAN) per tablet Take 1 tablet by mouth daily.        . nitroGLYCERIN (NITROSTAT) 0.4 MG SL tablet Place 0.4 mg under the tongue every 5 (five) minutes as needed. Chest pain      . omeprazole (PRILOSEC) 20 MG capsule Take 1 capsule (20 mg total) by mouth daily.  90 capsule  3  .  oxyCODONE-acetaminophen (PERCOCET) 7.5-325 MG per tablet Take 1 tablet by mouth every 6 (six) hours as needed for pain.  30 tablet  0  . pramipexole (MIRAPEX) 0.125 MG tablet 1 tablet nightly for one week then may titrate up to 2 tablets nightly  60 tablet  3  . testosterone cypionate (DEPOTESTOTERONE CYPIONATE) 200 MG/ML injection Inject 1 mL (200 mg total) into the muscle every 14 (fourteen) days.  10 mL  0  . tiotropium (SPIRIVA HANDIHALER) 18 MCG inhalation capsule Place 1 capsule (18 mcg total) into inhaler and inhale daily.  30 capsule  12  . traMADol (ULTRAM) 50 MG tablet Take 50 mg by mouth every 8 (eight) hours as needed.       . warfarin (COUMADIN) 5 MG tablet Take 1 tablet (5 mg total) by mouth as directed. Patient takes up to 1 1/2 daily.  50 tablet  3  . DISCONTD: metFORMIN (GLUCOPHAGE) 500 MG tablet Take 1 tablet (500 mg total) by mouth 2 (two) times daily with a meal.  60 tablet  1  . DISCONTD: metoprolol (LOPRESSOR) 50 MG tablet       . DISCONTD: spironolactone (ALDACTONE) 25 MG tablet       . DISCONTD: testosterone cypionate (DEPO-TESTOSTERONE) 200 MG/ML injection Inject 1 mL (200 mg total) into the muscle every 28 (twenty-eight) days.  1 mL  5    Allergies  Allergen Reactions  . Ace Inhibitors     REACTION: cough  . Codeine Nausea Only    REACTION: rash  . Penicillins Rash    History   Social History  . Marital Status: Married    Spouse Name: N/A    Number of Children: 3  . Years of Education: N/A   Occupational History  . Retired    Social History Main Topics  . Smoking status: Former Smoker -- 1.5 packs/day for 20 years    Types: Cigarettes    Quit date: 04/05/1983  . Smokeless tobacco: Never Used  . Alcohol Use: No  . Drug Use: No  . Sexually Active: Not on file   Other Topics Concern  . Not on file   Social History Narrative  . No narrative on file    Family History  Problem Relation Age of Onset  . Coronary artery disease      Male 1st degree  relative <50  . Coronary artery disease      male 1st degree relative <60  . Heart disease Father   . Migraines Father   . Ulcers Father   . Alzheimer's disease Mother   .  Heart disease Mother   . Heart disease Sister   . Obesity Sister     Morbid  . Arthritis Sister   . Heart disease Brother   . Arthritis Brother   . Sleep apnea Son   . Obesity Son   . Migraines Daughter   . Thyroid disease Daughter     Review of Systems:  As stated in the HPI and otherwise negative.   BP 140/84  Pulse 83  Ht 5\' 10"  (1.778 m)  Wt 272 lb (123.378 kg)  BMI 39.03 kg/m2  Physical Examination: General: Well developed, well nourished, NAD HEENT: OP clear, mucus membranes moist SKIN: warm, dry. No rashes. Neuro: No focal deficits Musculoskeletal: Muscle strength 5/5 all ext Psychiatric: Mood and affect normal Neck: No JVD, no carotid bruits, no thyromegaly, no lymphadenopathy. Lungs:Clear bilaterally, no wheezes, rhonci, crackles Cardiovascular: Regular rate and rhythm. No murmurs, gallops or rubs. Abdomen:Soft. Bowel sounds present. Non-tender.  Extremities: No lower extremity edema. Pulses are 2 + in the bilateral DP/PT.  EKG: NSR, RBBB, rate 83 bpm. PVCs.  Assessment and Plan:   1. CORONARY ARTERY DISEASE: Stable with recent cath June 2013. No chest pain. Will continue medical therapy.   2. Paroxysmal atrial fibrillation: He is maintaining NSR. 21 day monitor in July showed sinus rhythm with no atrial fibrillation.  He did not want to start Xarelto due to cost. He has been maintained on coumadin therapy. Will continue Toprol at current dose.

## 2011-10-17 NOTE — Patient Instructions (Addendum)
Your physician wants you to follow-up in:  6 months. You will receive a reminder letter in the mail two months in advance. If you don't receive a letter, please call our office to schedule the follow-up appointment.   

## 2011-10-31 ENCOUNTER — Ambulatory Visit: Payer: Medicare Other | Admitting: Family Medicine

## 2011-11-07 ENCOUNTER — Ambulatory Visit (INDEPENDENT_AMBULATORY_CARE_PROVIDER_SITE_OTHER): Payer: Medicare Other | Admitting: Family Medicine

## 2011-11-07 DIAGNOSIS — E291 Testicular hypofunction: Secondary | ICD-10-CM

## 2011-11-07 DIAGNOSIS — E349 Endocrine disorder, unspecified: Secondary | ICD-10-CM

## 2011-11-07 MED ORDER — TESTOSTERONE CYPIONATE 200 MG/ML IM SOLN
200.0000 mg | Freq: Once | INTRAMUSCULAR | Status: AC
  Administered 2011-11-07: 200 mg via INTRAMUSCULAR

## 2011-11-14 ENCOUNTER — Ambulatory Visit (INDEPENDENT_AMBULATORY_CARE_PROVIDER_SITE_OTHER): Payer: Medicare Other | Admitting: *Deleted

## 2011-11-14 DIAGNOSIS — I480 Paroxysmal atrial fibrillation: Secondary | ICD-10-CM

## 2011-11-14 DIAGNOSIS — Z7901 Long term (current) use of anticoagulants: Secondary | ICD-10-CM

## 2011-11-14 DIAGNOSIS — I4891 Unspecified atrial fibrillation: Secondary | ICD-10-CM

## 2011-11-14 LAB — POCT INR: INR: 1.7

## 2011-12-04 ENCOUNTER — Ambulatory Visit (INDEPENDENT_AMBULATORY_CARE_PROVIDER_SITE_OTHER): Payer: Medicare Other | Admitting: Family

## 2011-12-04 ENCOUNTER — Encounter: Payer: Self-pay | Admitting: Family Medicine

## 2011-12-04 ENCOUNTER — Ambulatory Visit (INDEPENDENT_AMBULATORY_CARE_PROVIDER_SITE_OTHER): Payer: Medicare Other | Admitting: Family Medicine

## 2011-12-04 VITALS — BP 120/70 | Temp 98.0°F

## 2011-12-04 DIAGNOSIS — G2581 Restless legs syndrome: Secondary | ICD-10-CM | POA: Insufficient documentation

## 2011-12-04 DIAGNOSIS — Z7901 Long term (current) use of anticoagulants: Secondary | ICD-10-CM

## 2011-12-04 DIAGNOSIS — I10 Essential (primary) hypertension: Secondary | ICD-10-CM

## 2011-12-04 DIAGNOSIS — Z862 Personal history of diseases of the blood and blood-forming organs and certain disorders involving the immune mechanism: Secondary | ICD-10-CM

## 2011-12-04 DIAGNOSIS — E119 Type 2 diabetes mellitus without complications: Secondary | ICD-10-CM

## 2011-12-04 DIAGNOSIS — I480 Paroxysmal atrial fibrillation: Secondary | ICD-10-CM

## 2011-12-04 DIAGNOSIS — Z8639 Personal history of other endocrine, nutritional and metabolic disease: Secondary | ICD-10-CM

## 2011-12-04 DIAGNOSIS — E785 Hyperlipidemia, unspecified: Secondary | ICD-10-CM

## 2011-12-04 LAB — POCT INR: INR: 3.3

## 2011-12-04 MED ORDER — TESTOSTERONE CYPIONATE 200 MG/ML IM SOLN
200.0000 mg | INTRAMUSCULAR | Status: DC
  Administered 2011-12-04: 200 mg via INTRAMUSCULAR

## 2011-12-04 NOTE — Patient Instructions (Addendum)
Taper off gabapentin by reducing one dose/day every one week.

## 2011-12-04 NOTE — Progress Notes (Signed)
Quick Note:  Pt informed ______ 

## 2011-12-04 NOTE — Progress Notes (Signed)
Subjective:    Patient ID: Gary Yates, male    DOB: [birth year], 78 y.o.   MRN: [id number]  HPI  Medical followup. Patient has multiple chronic problems including history of type 2 diabetes, diabetic peripheral neuropathy, CAD, peripheral vascular disease, hypertension, atrial fibrillation, dyslipidemia, COPD, and obesity. He also has low testosterone. Overall, he feels well at this time. Last visit we started Mirapex for probable restless leg syndrome and this has helped tremendously with his leg pain at night. He is sleeping much better. He is already starting to leave off occasional doses of gabapentin and has not noted any breakthrough pain.  Struggling to get Lantus filled because of cost. Frequently skips doses. Fasting blood sugar today 175. Last A1c 7.5%. Low testosterone -on replacement. Overall this has helped tremendously with his sense of well-being and energy levels.  Dyslipidemia with lipids checked in June. High Triglycerides and low HDL. Cholesterol fairly well controlled.  Past Medical History  Diagnosis Date  . Coronary artery disease     a. h/o Overlapping stents RCA;  b. 06/2011 Cath: patent stents, nonobs dzs, NL EF.  Marland Kitchen Hypertension   . Osteoarthritis     shoulder  . Restless leg   . Hyperlipidemia   . COPD (chronic obstructive pulmonary disease)   . Nephrolithiasis   . SVT (supraventricular tachycardia)   . Dyspnea   . GERD (gastroesophageal reflux disease)   . DM (diabetes mellitus)     Type 2, peripheral neuropathy.  . Diabetic peripheral neuropathy   . Atrial fibrillation   . Low testosterone    Past Surgical History  Procedure Date  . Cholecystectomy     reports that he quit smoking about 28 years ago. His smoking use included Cigarettes. He has a 30 pack-year smoking history. He has never used smokeless tobacco. He reports that he does not drink alcohol or use illicit drugs. family history includes Alzheimer's disease in his mother; Arthritis in his  brother and sister; Coronary artery disease in some unspecified family members; Heart disease in his brother, father, mother, and sister; Migraines in his daughter and father; Obesity in his sister and son; Sleep apnea in his son; Thyroid disease in his daughter; and Ulcers in his father. Allergies  Allergen Reactions  . Ace Inhibitors     REACTION: cough  . Codeine Nausea Only    REACTION: rash  . Penicillins Rash      Review of Systems  Constitutional: Negative for fatigue.  Eyes: Negative for visual disturbance.  Respiratory: Negative for cough, chest tightness and shortness of breath.   Cardiovascular: Negative for chest pain, palpitations and leg swelling.  Neurological: Negative for dizziness, syncope, weakness, light-headedness and headaches.       Objective:   Physical Exam  Constitutional: He is oriented to person, place, and time. He appears well-developed and well-nourished.  Neck: Neck supple. No thyromegaly present.  Cardiovascular: Normal rate and regular rhythm.   Pulmonary/Chest: Effort normal and breath sounds normal. No respiratory distress. He has no wheezes. He has no rales.  Musculoskeletal: He exhibits no edema.       Feet reveal no skin lesions. Good distal foot pulses. Good capillary refill. No calluses. Normal sensation with monofilament testing   Neurological: He is alert and oriented to person, place, and time.          Assessment & Plan:  #1 type 2 diabetes. History of marginal control. Samples of Lantus given. Encouraged to get back on this regularly. Recheck A1c. Continue  work on weight loss #2 restless leg syndrome. Improved on Mirapex. Continue low-dose Mirapex.  #3 history of chronic leg pain. Possibly combination #2 and diabetic peripheral neuropathy. He requests trial of tapering off gabapentin and we have given him instructions #4 dyslipidemia. Recent lipids checked in June and we'll check him on yearly basis #5 low testosterone. Recent  levels normal on replacement.

## 2011-12-04 NOTE — Patient Instructions (Signed)
Today only, take only 1 tablet.  Eat extra serving of greens. Then continue dose of 1 pill everyday except 1.5 pill on Mondays, Wednesdays and Fridays. Recheck in 3 weeks.     Latest dosing instructions   Total Sun Mon Tue Wed Thu Fri Sat   42.5 5 mg 7.5 mg 5 mg 7.5 mg 5 mg 7.5 mg 5 mg    (5 mg1) (5 mg1.5) (5 mg1) (5 mg1.5) (5 mg1) (5 mg1.5) (5 mg1)

## 2011-12-26 ENCOUNTER — Ambulatory Visit (INDEPENDENT_AMBULATORY_CARE_PROVIDER_SITE_OTHER): Payer: Medicare Other | Admitting: Family

## 2011-12-26 DIAGNOSIS — I480 Paroxysmal atrial fibrillation: Secondary | ICD-10-CM

## 2011-12-26 DIAGNOSIS — E349 Endocrine disorder, unspecified: Secondary | ICD-10-CM

## 2011-12-26 DIAGNOSIS — Z7901 Long term (current) use of anticoagulants: Secondary | ICD-10-CM

## 2011-12-26 LAB — POCT INR: INR: 3.2

## 2011-12-26 MED ORDER — TESTOSTERONE CYPIONATE 200 MG/ML IM SOLN
200.0000 mg | INTRAMUSCULAR | Status: DC
  Administered 2011-12-26: 200 mg via INTRAMUSCULAR

## 2011-12-26 NOTE — Patient Instructions (Addendum)
Eat extra serving of greens. Then continue dose of 1 pill everyday except 1.5 pill on Mondays and Fridays. Recheck in 4 weeks.     Latest dosing instructions   Total Sun Mon Tue Wed Thu Fri Sat   40 5 mg 7.5 mg 5 mg 5 mg 5 mg 7.5 mg 5 mg    (5 mg1) (5 mg1.5) (5 mg1) (5 mg1) (5 mg1) (5 mg1.5) (5 mg1)

## 2011-12-26 NOTE — Addendum Note (Signed)
Addended by: Jill Side on: 12/26/2011 08:51 AM   Modules accepted: Orders

## 2012-01-16 ENCOUNTER — Other Ambulatory Visit: Payer: Self-pay | Admitting: Cardiovascular Disease

## 2012-01-23 ENCOUNTER — Other Ambulatory Visit: Payer: Self-pay | Admitting: *Deleted

## 2012-01-23 ENCOUNTER — Ambulatory Visit (INDEPENDENT_AMBULATORY_CARE_PROVIDER_SITE_OTHER): Payer: PRIVATE HEALTH INSURANCE | Admitting: Family

## 2012-01-23 DIAGNOSIS — I480 Paroxysmal atrial fibrillation: Secondary | ICD-10-CM

## 2012-01-23 DIAGNOSIS — I4891 Unspecified atrial fibrillation: Secondary | ICD-10-CM

## 2012-01-23 DIAGNOSIS — E349 Endocrine disorder, unspecified: Secondary | ICD-10-CM

## 2012-01-23 DIAGNOSIS — E291 Testicular hypofunction: Secondary | ICD-10-CM

## 2012-01-23 DIAGNOSIS — Z7901 Long term (current) use of anticoagulants: Secondary | ICD-10-CM

## 2012-01-23 MED ORDER — TESTOSTERONE CYPIONATE 200 MG/ML IM SOLN: 200.0000 mg | INTRAMUSCULAR | Status: DC

## 2012-01-23 MED ORDER — TESTOSTERONE CYPIONATE 200 MG/ML IM SOLN
200.0000 mg | INTRAMUSCULAR | Status: DC
  Administered 2012-01-23: 200 mg via INTRAMUSCULAR

## 2012-01-23 NOTE — Telephone Encounter (Signed)
Testosterone refill request, last filled in July, 10 ml. Testosterone last checked in August, it was 413.

## 2012-01-23 NOTE — Addendum Note (Signed)
Addended by: Jill Side on: 01/23/2012 09:12 AM   Modules accepted: Orders

## 2012-01-23 NOTE — Telephone Encounter (Signed)
Refill OK

## 2012-01-23 NOTE — Patient Instructions (Addendum)
Eat extra serving of greens. Then continue dose of 1 pill everyday except 1.5 pill on Mondays and Fridays. Recheck in 4 weeks.     Latest dosing instructions   Total Sun Mon Tue Wed Thu Fri Sat   40 5 mg 7.5 mg 5 mg 5 mg 5 mg 7.5 mg 5 mg    (5 mg1) (5 mg1.5) (5 mg1) (5 mg1) (5 mg1) (5 mg1.5) (5 mg1)

## 2012-02-03 ENCOUNTER — Other Ambulatory Visit: Payer: Self-pay | Admitting: Family Medicine

## 2012-02-07 ENCOUNTER — Other Ambulatory Visit: Payer: Self-pay | Admitting: Family Medicine

## 2012-02-20 ENCOUNTER — Encounter: Payer: PRIVATE HEALTH INSURANCE | Admitting: Family

## 2012-02-25 ENCOUNTER — Ambulatory Visit (INDEPENDENT_AMBULATORY_CARE_PROVIDER_SITE_OTHER): Payer: PRIVATE HEALTH INSURANCE | Admitting: Family

## 2012-02-25 DIAGNOSIS — I4891 Unspecified atrial fibrillation: Secondary | ICD-10-CM

## 2012-02-25 DIAGNOSIS — E291 Testicular hypofunction: Secondary | ICD-10-CM

## 2012-02-25 DIAGNOSIS — I480 Paroxysmal atrial fibrillation: Secondary | ICD-10-CM

## 2012-02-25 DIAGNOSIS — E349 Endocrine disorder, unspecified: Secondary | ICD-10-CM

## 2012-02-25 DIAGNOSIS — Z7901 Long term (current) use of anticoagulants: Secondary | ICD-10-CM

## 2012-02-25 LAB — POCT INR: INR: 2

## 2012-02-25 MED ORDER — TESTOSTERONE CYPIONATE 200 MG/ML IM SOLN
200.0000 mg | INTRAMUSCULAR | Status: DC
  Administered 2012-02-25: 200 mg via INTRAMUSCULAR

## 2012-02-25 NOTE — Addendum Note (Signed)
Addended by: Jill Side on: 02/25/2012 08:49 AM   Modules accepted: Orders

## 2012-02-25 NOTE — Patient Instructions (Addendum)
Continue dose of 1 pill everyday except 1.5 pill on Mondays and Fridays. Recheck in 4 weeks.   Anticoagulation Dose Instructions as of 02/25/2012     Dorene Grebe Tue Wed Thu Fri Sat   New Dose 5 mg 7.5 mg 5 mg 5 mg 5 mg 7.5 mg 5 mg    Description       Continue dose of 1 pill everyday except 1.5 pill on Mondays and Fridays. Recheck in 4 weeks.

## 2012-03-20 ENCOUNTER — Other Ambulatory Visit: Payer: Self-pay | Admitting: Family Medicine

## 2012-03-22 NOTE — Telephone Encounter (Signed)
Refill for 6 months. 

## 2012-03-26 ENCOUNTER — Telehealth: Payer: Self-pay | Admitting: Family Medicine

## 2012-03-26 NOTE — Telephone Encounter (Signed)
Please advise 

## 2012-03-26 NOTE — Telephone Encounter (Signed)
Is patient currently taking any Percocet to augment pain control? If not we could refill for #30 tablets for prn use at night.

## 2012-03-26 NOTE — Telephone Encounter (Signed)
Pt reports he has Percocet and took 2 last night for the first time and did not get a wink of sleep.  I encouraged him to take 1 at night, give it a try for several nights and let us know if no relief.  He voiced his understanding.  FYI

## 2012-03-26 NOTE — Telephone Encounter (Signed)
Patient Information:  Caller Name: Inez Catalina  Phone: [phone]  Patient: Gary Yates, Gary Yates  Gender: Male  DOB: 10-05-1933  Age: 79 Years  PCP: Carolann Littler [facility])  Office Follow Up:  Does the office need to follow up with this patient?: Yes  Instructions For The Office: Please follow up with pt and advise on treatment for leg pain.  Pt has appt for OV on 3/27  RN Note:  appt with PCP on 3/27 3 month follow up.  Pt would like PCP to review note and follow up with pt on leg pain at night and any treatment he recommends.  Pt is concerned that he has been 2 nights without sleep  Symptoms  Reason For Call & Symptoms: Spouse calling about Legs hurting and keeping him up at night.  Onset 3/17, has dx of Restless leg.  Pt states that he was placed on Amitriptyline 25mg  about 5-6 months ago for same issue and it was resolved until the last 2 nights.  Pain starts at knee and goes down.  Pain starts at night and is resolved in daytime.  BS-150-200.  Reviewed Health History In EMR: Yes  Reviewed Medications In EMR: Yes  Reviewed Allergies In EMR: Yes  Reviewed Surgeries / Procedures: Yes  Date of Onset of Symptoms: 03/23/2012  Guideline(s) Used:  Leg Pain  Disposition Per Guideline:   See Within 2 Weeks in Office  Reason For Disposition Reached:   Leg pain or muscle cramp is a chronic symptom (recurrent or ongoing AND lasting > 4 weeks)  Advice Given:  Call Back If:  Signs of infection occur (e.g., spreading redness, warmth, fever)  You become worse.  Patient Will Follow Care Advice:  YES

## 2012-03-30 ENCOUNTER — Telehealth: Payer: Self-pay | Admitting: Family Medicine

## 2012-03-30 MED ORDER — OXYCODONE-ACETAMINOPHEN 7.5-325 MG PO TABS: 1.0000 | ORAL_TABLET | Freq: Four times a day (QID) | ORAL | Status: DC | PRN

## 2012-03-30 NOTE — Telephone Encounter (Signed)
Oxycodone last filled 08-14-11, #40 with 0 refills

## 2012-03-30 NOTE — Telephone Encounter (Signed)
Pt needs new rx oxycodone. Pt has 2 pills left

## 2012-03-30 NOTE — Telephone Encounter (Signed)
Refill once OK. 

## 2012-03-30 NOTE — Telephone Encounter (Signed)
Pt informed Rx is ready to pick-up

## 2012-04-02 ENCOUNTER — Ambulatory Visit (INDEPENDENT_AMBULATORY_CARE_PROVIDER_SITE_OTHER): Payer: PRIVATE HEALTH INSURANCE | Admitting: Family Medicine

## 2012-04-02 VITALS — BP 132/72 | Temp 98.5°F | Wt 272.0 lb

## 2012-04-02 DIAGNOSIS — Z8639 Personal history of other endocrine, nutritional and metabolic disease: Secondary | ICD-10-CM

## 2012-04-02 DIAGNOSIS — Z7901 Long term (current) use of anticoagulants: Secondary | ICD-10-CM

## 2012-04-02 DIAGNOSIS — G2581 Restless legs syndrome: Secondary | ICD-10-CM

## 2012-04-02 DIAGNOSIS — I480 Paroxysmal atrial fibrillation: Secondary | ICD-10-CM

## 2012-04-02 DIAGNOSIS — I4891 Unspecified atrial fibrillation: Secondary | ICD-10-CM

## 2012-04-02 DIAGNOSIS — Z862 Personal history of diseases of the blood and blood-forming organs and certain disorders involving the immune mechanism: Secondary | ICD-10-CM

## 2012-04-02 DIAGNOSIS — E119 Type 2 diabetes mellitus without complications: Secondary | ICD-10-CM

## 2012-04-02 LAB — POCT INR: INR: 2.3

## 2012-04-02 LAB — HEMOGLOBIN A1C: Hgb A1c MFr Bld: 10.5 % — ABNORMAL HIGH (ref 4.6–6.5)

## 2012-04-02 MED ORDER — TESTOSTERONE CYPIONATE 200 MG/ML IM SOLN: 200.0000 mg | INTRAMUSCULAR | Status: DC

## 2012-04-02 NOTE — Progress Notes (Signed)
Subjective:    Patient ID: Gary Yates., male    DOB: 05/15/1933, 79 y.o.   MRN: [id number]  HPI Patient seen for medical follow up  Type 2 diabetes which has been suboptimally controlled. He is now back on Lantus regularly. No hypoglycemia. Fasting blood sugars generally ranging around 150. Needs followup A1c. No reported hyperglycemic symptoms  Long history of peripheral neuropathy pains. Generally well-controlled until earlier this week. Takes chronic amitriptyline and gabapentin We refilled his oxycodone but this did not seem to help much. He has taken this rarely in the past Also has restless leg syndrome and last fall he started Mirapex which helped greatly. He's only been taking 0.125 mg one tablet at night and last night and that dosage of 2 tablets had improvement in leg symptoms.  Atrial fibrillation history. Remains on chronic Coumadin. No bleeding complications.  Hypertension which is been stable. Remains on lisinopril and Toprol.  Past Medical History  Diagnosis Date  . Coronary artery disease     a. h/o Overlapping stents RCA;  b. 06/2011 Cath: patent stents, nonobs dzs, NL EF.  Marland Kitchen Hypertension   . Osteoarthritis     shoulder  . Restless leg   . Hyperlipidemia   . COPD (chronic obstructive pulmonary disease)   . Nephrolithiasis   . SVT (supraventricular tachycardia)   . Dyspnea   . GERD (gastroesophageal reflux disease)   . DM (diabetes mellitus)     Type 2, peripheral neuropathy.  . Diabetic peripheral neuropathy   . Atrial fibrillation   . Low testosterone    Past Surgical History  Procedure Laterality Date  . Cholecystectomy      reports that he quit smoking about 29 years ago. His smoking use included Cigarettes. He has a 30 pack-year smoking history. He has never used smokeless tobacco. He reports that he does not drink alcohol or use illicit drugs. family history includes Alzheimer's disease in his mother; Arthritis in his brother and sister; Coronary  artery disease in some unspecified family members; Heart disease in his brother, father, mother, and sister; Migraines in his daughter and father; Obesity in his sister and son; Sleep apnea in his son; Thyroid disease in his daughter; and Ulcers in his father. Allergies  Allergen Reactions  . Ace Inhibitors     REACTION: cough  . Codeine Nausea Only    REACTION: rash  . Penicillins Rash      Review of Systems  Constitutional: Negative for fatigue and unexpected weight change.  Eyes: Negative for visual disturbance.  Respiratory: Negative for cough, chest tightness and shortness of breath.   Cardiovascular: Negative for chest pain, palpitations and leg swelling.  Neurological: Negative for dizziness, syncope, weakness, light-headedness and headaches.       Objective:   Physical Exam  Constitutional: He appears well-developed and well-nourished.  Cardiovascular: Normal rate and regular rhythm.   Pulmonary/Chest: Effort normal and breath sounds normal. No respiratory distress. He has no wheezes. He has no rales.  Musculoskeletal: He exhibits no edema.  Skin:  Feet reveal no skin lesions. Good distal foot pulses. Good capillary refill. No calluses. Normal sensation with monofilament testing Skin is generally dry but no skin lesions noted. 2 plus dorsalis pedis pulses bilaterally           Assessment & Plan:  #1 type 2 diabetes. Recheck A1c. Further titrate insulin as indicated #2 chronic atrial fibrillation. Currently sinus rhythm by exam today. Recheck INR. Discussed possibility of conversion to oral medication that  would not require monitoring at this point he is uncertain because of cost issues  #3 chronic peripheral neuropathy. Treated with combination of amitriptyline and gabapentin and generally well-controlled #4 restless leg syndrome. Titrate Mirapex to 0.25 mg nightly

## 2012-04-02 NOTE — Patient Instructions (Addendum)
Anticoagulation Dose Instructions as of 04/02/2012     Gary Yates Tue Wed Thu Fri Sat   New Dose 5 mg 7.5 mg 5 mg 5 mg 5 mg 7.5 mg 5 mg    Description       Continue dose of 1 pill everyday except 1.5 pill on Mondays and Fridays. Recheck in 4 weeks.

## 2012-04-29 ENCOUNTER — Ambulatory Visit: Payer: PRIVATE HEALTH INSURANCE | Admitting: Cardiovascular Disease

## 2012-05-05 ENCOUNTER — Encounter: Payer: PRIVATE HEALTH INSURANCE | Admitting: Family

## 2012-05-07 ENCOUNTER — Ambulatory Visit (INDEPENDENT_AMBULATORY_CARE_PROVIDER_SITE_OTHER): Payer: PRIVATE HEALTH INSURANCE | Admitting: Family Medicine

## 2012-05-07 ENCOUNTER — Ambulatory Visit (INDEPENDENT_AMBULATORY_CARE_PROVIDER_SITE_OTHER): Payer: PRIVATE HEALTH INSURANCE | Admitting: Family

## 2012-05-07 DIAGNOSIS — E291 Testicular hypofunction: Secondary | ICD-10-CM

## 2012-05-07 DIAGNOSIS — E349 Endocrine disorder, unspecified: Secondary | ICD-10-CM

## 2012-05-07 DIAGNOSIS — Z7901 Long term (current) use of anticoagulants: Secondary | ICD-10-CM

## 2012-05-07 DIAGNOSIS — I480 Paroxysmal atrial fibrillation: Secondary | ICD-10-CM

## 2012-05-07 DIAGNOSIS — I4891 Unspecified atrial fibrillation: Secondary | ICD-10-CM

## 2012-05-07 LAB — POCT INR: INR: 2.9

## 2012-05-07 MED ORDER — TESTOSTERONE CYPIONATE 200 MG/ML IM SOLN
200.0000 mg | INTRAMUSCULAR | Status: DC
  Administered 2012-05-07: 200 mg via INTRAMUSCULAR

## 2012-05-07 NOTE — Patient Instructions (Addendum)
Continue dose of 1 pill everyday except 1.5 pill on Mondays and Fridays. Recheck in 4 weeks.   Anticoagulation Dose Instructions as of 05/07/2012     Dorene Grebe Tue Wed Thu Fri Sat   New Dose 5 mg 7.5 mg 5 mg 5 mg 5 mg 7.5 mg 5 mg    Description       Continue dose of 1 pill everyday except 1.5 pill on Mondays and Fridays. Recheck in 4 weeks.

## 2012-05-14 ENCOUNTER — Other Ambulatory Visit: Payer: Self-pay | Admitting: Family Medicine

## 2012-05-20 ENCOUNTER — Telehealth: Payer: Self-pay | Admitting: Family Medicine

## 2012-05-20 ENCOUNTER — Other Ambulatory Visit: Payer: Self-pay | Admitting: Family Medicine

## 2012-05-20 NOTE — Telephone Encounter (Signed)
Patient called stating that he need a refill of his oxycodone 7.5-325 mg 1po q 6hrs prn for pain. Please assist.

## 2012-05-20 NOTE — Telephone Encounter (Signed)
Oxycodone last filled 03-30-12, #40 with 0 refills

## 2012-05-26 NOTE — Telephone Encounter (Signed)
Pt following up on oxyCODONE-acetaminophen (PERCOCET) 7.5-325 MG per tablet refill request done on 05/20/12. When ready, pls call pt home or pt's wife cell:  [phone]

## 2012-05-27 MED ORDER — OXYCODONE-ACETAMINOPHEN 7.5-325 MG PO TABS: 1.0000 | ORAL_TABLET | Freq: Four times a day (QID) | ORAL | Status: DC | PRN

## 2012-05-27 NOTE — Telephone Encounter (Signed)
Refill once 

## 2012-05-27 NOTE — Telephone Encounter (Signed)
Pt informed ready to pick up

## 2012-06-02 ENCOUNTER — Other Ambulatory Visit: Payer: Self-pay | Admitting: Physical Medicine and Rehabilitation

## 2012-06-02 DIAGNOSIS — R52 Pain, unspecified: Secondary | ICD-10-CM

## 2012-06-03 ENCOUNTER — Ambulatory Visit (INDEPENDENT_AMBULATORY_CARE_PROVIDER_SITE_OTHER): Payer: PRIVATE HEALTH INSURANCE | Admitting: Family

## 2012-06-03 DIAGNOSIS — I4891 Unspecified atrial fibrillation: Secondary | ICD-10-CM

## 2012-06-03 DIAGNOSIS — Z7901 Long term (current) use of anticoagulants: Secondary | ICD-10-CM

## 2012-06-03 DIAGNOSIS — E349 Endocrine disorder, unspecified: Secondary | ICD-10-CM

## 2012-06-03 DIAGNOSIS — I480 Paroxysmal atrial fibrillation: Secondary | ICD-10-CM

## 2012-06-03 DIAGNOSIS — E291 Testicular hypofunction: Secondary | ICD-10-CM

## 2012-06-03 LAB — POCT INR: INR: 2.1

## 2012-06-03 MED ORDER — TESTOSTERONE CYPIONATE 200 MG/ML IM SOLN
200.0000 mg | INTRAMUSCULAR | Status: DC
  Administered 2012-06-03: 200 mg via INTRAMUSCULAR

## 2012-06-03 NOTE — Addendum Note (Signed)
Addended by: Jill Side on: 06/03/2012 02:38 PM   Modules accepted: Orders

## 2012-06-03 NOTE — Patient Instructions (Addendum)
Continue dose of 1 pill everyday except 1.5 pill on Mondays and Fridays. Recheck in 6 weeks Anticoagulation Dose Instructions as of 06/03/2012     Dorene Grebe Tue Wed Thu Fri Sat   New Dose 5 mg 7.5 mg 5 mg 5 mg 5 mg 7.5 mg 5 mg    Description       Continue dose of 1 pill everyday except 1.5 pill on Mondays and Fridays. Recheck in 6 weeks.

## 2012-06-04 ENCOUNTER — Encounter: Payer: PRIVATE HEALTH INSURANCE | Admitting: Family

## 2012-06-06 ENCOUNTER — Ambulatory Visit
Admission: RE | Admit: 2012-06-06 | Discharge: 2012-06-06 | Disposition: A | Discharge status: Home / Self Care | Payer: PRIVATE HEALTH INSURANCE | Source: Ambulatory Visit | Attending: Physical Medicine and Rehabilitation | Admitting: Physical Medicine and Rehabilitation

## 2012-06-06 DIAGNOSIS — R52 Pain, unspecified: Secondary | ICD-10-CM

## 2012-06-07 ENCOUNTER — Other Ambulatory Visit: Payer: PRIVATE HEALTH INSURANCE

## 2012-06-08 ENCOUNTER — Other Ambulatory Visit: Payer: Self-pay | Admitting: Physical Medicine and Rehabilitation

## 2012-06-08 DIAGNOSIS — M5124 Other intervertebral disc displacement, thoracic region: Secondary | ICD-10-CM

## 2012-06-14 ENCOUNTER — Other Ambulatory Visit: Payer: Self-pay | Admitting: Family Medicine

## 2012-06-15 ENCOUNTER — Ambulatory Visit (INDEPENDENT_AMBULATORY_CARE_PROVIDER_SITE_OTHER): Payer: PRIVATE HEALTH INSURANCE | Admitting: Family

## 2012-06-15 ENCOUNTER — Other Ambulatory Visit: Payer: Self-pay | Admitting: *Deleted

## 2012-06-15 ENCOUNTER — Ambulatory Visit
Admission: RE | Admit: 2012-06-15 | Discharge: 2012-06-15 | Disposition: A | Discharge status: Home / Self Care | Payer: PRIVATE HEALTH INSURANCE | Source: Ambulatory Visit | Attending: Physical Medicine and Rehabilitation | Admitting: Physical Medicine and Rehabilitation

## 2012-06-15 DIAGNOSIS — Z7901 Long term (current) use of anticoagulants: Secondary | ICD-10-CM

## 2012-06-15 DIAGNOSIS — I480 Paroxysmal atrial fibrillation: Secondary | ICD-10-CM

## 2012-06-15 DIAGNOSIS — I4891 Unspecified atrial fibrillation: Secondary | ICD-10-CM

## 2012-06-15 DIAGNOSIS — M5124 Other intervertebral disc displacement, thoracic region: Secondary | ICD-10-CM

## 2012-06-15 MED ORDER — IOHEXOL 300 MG/ML  SOLN
1.0000 mL | Freq: Once | INTRAMUSCULAR | Status: AC | PRN
  Administered 2012-06-15: 1 mL via EPIDURAL

## 2012-06-15 MED ORDER — TRIAMCINOLONE ACETONIDE 40 MG/ML IJ SUSP (RADIOLOGY)
60.0000 mg | Freq: Once | INTRAMUSCULAR | Status: AC
  Administered 2012-06-15: 60 mg via EPIDURAL

## 2012-06-15 MED ORDER — PRAMIPEXOLE DIHYDROCHLORIDE 0.125 MG PO TABS: ORAL_TABLET | ORAL | Status: DC

## 2012-06-15 NOTE — Patient Instructions (Signed)
2 tabs today and tomorrow. Continue dose of 1 pill everyday except 1.5 pill on Mondays and Fridays. Recheck in 2 weeks.   Anticoagulation Dose Instructions as of 06/15/2012     Dorene Grebe Tue Wed Thu Fri Sat   New Dose 5 mg 7.5 mg 5 mg 5 mg 5 mg 7.5 mg 5 mg    Description       2 tabs today and tomorrow. Continue dose of 1 pill everyday except 1.5 pill on Mondays and Fridays. Recheck in 2 weeks.

## 2012-06-17 ENCOUNTER — Encounter: Payer: Self-pay | Admitting: Cardiovascular Disease

## 2012-06-17 ENCOUNTER — Ambulatory Visit (INDEPENDENT_AMBULATORY_CARE_PROVIDER_SITE_OTHER): Payer: PRIVATE HEALTH INSURANCE | Admitting: Cardiovascular Disease

## 2012-06-17 VITALS — BP 129/71 | HR 90 | Ht 70.0 in | Wt 273.4 lb

## 2012-06-17 DIAGNOSIS — I4891 Unspecified atrial fibrillation: Secondary | ICD-10-CM

## 2012-06-17 DIAGNOSIS — I2510 Atherosclerotic heart disease of native coronary artery without angina pectoris: Secondary | ICD-10-CM

## 2012-06-17 MED ORDER — SILDENAFIL CITRATE 50 MG PO TABS: 50.0000 mg | ORAL_TABLET | Freq: Every day | ORAL | Status: DC | PRN

## 2012-06-17 NOTE — Progress Notes (Signed)
History of Present Illness: 79 yo WM with history of HTN, hyperlipidemia, morbid obesity, DM, CAD, atrial fibrillation and obstructive lung disease here for hospital follow up. He was admitted to [facility] 06/18/11 with chest pain. Cardiac cath on 06/18/11 with moderate LAD disease with patent RCA stents. No flow limiting lesions. Normal LVEF. D Dimer and CE were normal. He was also noted to have tachycardia which was felt to be atrial fibrillation. Dr. Caryl Comes saw him in the hospital and started Xarelto for anticoagulation and increased metoprolol for rate control. Slurred speech and seen by Neuro. Head CT without evidence of CVA. Head MRI with questionable tiny acute infarct posterior left frontal lobe, mild small vessel disease type changes, global atrophy without hydrocephalus. Carotid artery dopplers 08/07/10 with mild (0-39%) bilateral ICA stenosis. He decided not to start Xarelto due to cost. He was started on coumadin and has been followed in our coumadin clinic over the last 3 months.   He is doing well. He denies any chest pains or SOB. He has not noticed any palpitations. He has had no bleeding with coumadin.   Primary Care Physician: Carolann Littler  Last Lipid Profile:Lipid Panel     Component Value Date/Time   CHOL 171 06/20/2011 0552   TRIG 327* 06/20/2011 0552   HDL 25* 06/20/2011 0552   CHOLHDL 6.8 06/20/2011 0552   VLDL 65* 06/20/2011 0552   LDLCALC 81 06/20/2011 0552     Past Medical History  Diagnosis Date  . Coronary artery disease     a. h/o Overlapping stents RCA;  b. 06/2011 Cath: patent stents, nonobs dzs, NL EF.  Marland Kitchen Hypertension   . Osteoarthritis     shoulder  . Restless leg   . Hyperlipidemia   . COPD (chronic obstructive pulmonary disease)   . Nephrolithiasis   . SVT (supraventricular tachycardia)   . Dyspnea   . GERD (gastroesophageal reflux disease)   . DM (diabetes mellitus)     Type 2, peripheral neuropathy.  . Diabetic peripheral neuropathy   .  Atrial fibrillation   . Low testosterone     Past Surgical History  Procedure Laterality Date  . Cholecystectomy      Current Outpatient Prescriptions  Medication Sig Dispense Refill  . ACCU-CHEK AVIVA PLUS test strip TEST 3 TIMES A DAY  100 strip  11  . amitriptyline (ELAVIL) 25 MG tablet TAKE 1 TABLET BY MOUTH AT BEDTIME  30 tablet  6  . aspirin 81 MG tablet Take 81 mg by mouth daily.        Marland Kitchen atorvastatin (LIPITOR) 10 MG tablet TAKE 1 TABLET EVERY DAY  90 tablet  3  . Calcium Carbonate-Vitamin D (CALCIUM + D PO) Take by mouth daily.        . diphenhydrAMINE (BENADRYL) 25 MG tablet Take 25 mg by mouth every 6 (six) hours as needed.       . fenofibrate micronized (LOFIBRA) 134 MG capsule Take 1 capsule by mouth  daily before breakfast  90 capsule  3  . gabapentin (NEURONTIN) 300 MG capsule Take 2 capsules by mouth 3  times a day  540 capsule  3  . glimepiride (AMARYL) 4 MG tablet Take 1 tablet by mouth  daily before breakfast  90 tablet  3  . glucose blood (ACCU-CHEK AVIVA PLUS) test strip Test BS 3 times daily  100 each  6  . HYDROmorphone (DILAUDID) 2 MG tablet prn      . insulin glargine (LANTUS)  100 UNIT/ML injection Inject 44 Units into the skin 2 (two) times daily.      . Lancets (ACCU-CHEK MULTICLIX) lancets Use twice daily as instructed  204 each  3  . LANTUS SOLOSTAR 100 UNIT/ML injection INJECT 40 UNITS INTO THE SKIN 2 (TWO) TIMES DAILY.  30 Syringe  10  . lisinopril (PRINIVIL,ZESTRIL) 5 MG tablet Take 1 tablet by mouth  daily  90 tablet  3  . metoprolol succinate (TOPROL-XL) 100 MG 24 hr tablet Take 1 tablet by mouth  every day immediately  following a meal  90 tablet  3  . nitroGLYCERIN (NITROSTAT) 0.4 MG SL tablet Place 0.4 mg under the tongue every 5 (five) minutes as needed. Chest pain      . omeprazole (PRILOSEC) 20 MG capsule Take 1 capsule by mouth  daily  90 capsule  3  . pramipexole (MIRAPEX) 0.125 MG tablet 1 TABLET NIGHTLY FOR ONE WEEK THEN MAY TITRATE UP TO 2  TABLETS NIGHTLY  60 tablet  3  . testosterone cypionate (DEPO-TESTOSTERONE) 200 MG/ML injection Inject 1 mL (200 mg total) into the muscle every 28 (twenty-eight) days.  10 mL  0  . tiotropium (SPIRIVA) 18 MCG inhalation capsule Place 18 mcg into inhaler and inhale daily.      Marland Kitchen warfarin (COUMADIN) 5 MG tablet TAKE 1 TABLET (5 MG TOTAL) BY MOUTH AS DIRECTED. PATIENT TAKES UP TO 1&1/2 DAILY.  50 tablet  3  . [DISCONTINUED] metFORMIN (GLUCOPHAGE) 500 MG tablet Take 1 tablet (500 mg total) by mouth 2 (two) times daily with a meal.  60 tablet  1  . [DISCONTINUED] metoprolol (LOPRESSOR) 50 MG tablet       . [DISCONTINUED] spironolactone (ALDACTONE) 25 MG tablet       . [DISCONTINUED] testosterone cypionate (DEPO-TESTOSTERONE) 200 MG/ML injection Inject 1 mL (200 mg total) into the muscle every 28 (twenty-eight) days.  1 mL  5   No current facility-administered medications for this visit.   Facility-Administered Medications Ordered in Other Visits  Medication Dose Route Frequency Provider Last Rate Last Dose  . testosterone cypionate (DEPOTESTOTERONE CYPIONATE) injection 200 mg  200 mg Intramuscular Q28 days [name], MD   200 mg at 12/26/11 0850  . testosterone cypionate (DEPOTESTOTERONE CYPIONATE) injection 200 mg  200 mg Intramuscular Q28 days [name], MD   200 mg at 01/23/12 0908  . testosterone cypionate (DEPOTESTOTERONE CYPIONATE) injection 200 mg  200 mg Intramuscular Q28 days [name], MD   200 mg at 02/25/12 0848  . testosterone cypionate (DEPOTESTOTERONE CYPIONATE) injection 200 mg  200 mg Intramuscular Q28 days [name], MD   200 mg at 06/03/12 1437    Allergies  Allergen Reactions  . Ace Inhibitors Other (See Comments)    cough  . Codeine Nausea Only and Rash       . Penicillins Rash    History   Social History  . Marital Status: Married    Spouse Name: N/A    Number of Children: 3  . Years of Education: N/A   Occupational History  .  Retired    Social History Main Topics  . Smoking status: Former Smoker -- 1.50 packs/day for 20 years    Types: Cigarettes    Quit date: 04/05/1983  . Smokeless tobacco: Never Used  . Alcohol Use: No  . Drug Use: No  . Sexually Active: Not on file   Other Topics Concern  . Not on file   Social  History Narrative  . No narrative on file    Family History  Problem Relation Age of Onset  . Coronary artery disease      Male 1st degree relative <50  . Coronary artery disease      male 1st degree relative <60  . Heart disease Father   . Migraines Father   . Ulcers Father   . Alzheimer's disease Mother   . Heart disease Mother   . Heart disease Sister   . Obesity Sister     Morbid  . Arthritis Sister   . Heart disease Brother   . Arthritis Brother   . Sleep apnea Son   . Obesity Son   . Migraines Daughter   . Thyroid disease Daughter     Review of Systems:  As stated in the HPI and otherwise negative.   BP 129/71  Pulse 90  Ht 5\' 10"  (1.778 m)  Wt 273 lb 6.4 oz (124.013 kg)  BMI 39.23 kg/m2  Physical Examination: General: Well developed, well nourished, NAD HEENT: OP clear, mucus membranes moist SKIN: warm, dry. No rashes. Neuro: No focal deficits Musculoskeletal: Muscle strength 5/5 all ext Psychiatric: Mood and affect normal Neck: No JVD, no carotid bruits, no thyromegaly, no lymphadenopathy. Lungs:Clear bilaterally, no wheezes, rhonci, crackles Cardiovascular: Regular rate and rhythm. No murmurs, gallops or rubs. Abdomen:Soft. Bowel sounds present. Non-tender.  Extremities: No lower extremity edema. Pulses are 2 + in the bilateral DP/PT.  Assessment and Plan:   1. CORONARY ARTERY DISEASE: Stable. Last cath June 2013. No chest pain. Will continue medical therapy.   2. Paroxysmal atrial fibrillation: He is maintaining NSR. 21 day monitor in July 2013 showed sinus rhythm with no atrial fibrillation. He did not want to start Xarelto due to cost. He has been  maintained on coumadin therapy. Will continue Toprol at current dose. He will consider switching to Xarelto after checking cost with his insurance company. If he calls back and wants to be on Xarelto, will need to check CBC and BMET. Will dose based on Creatinine clearance.

## 2012-06-17 NOTE — Patient Instructions (Addendum)
Your physician wants you to follow-up in:  6 months. You will receive a reminder letter in the mail two months in advance. If you don't receive a letter, please call our office to schedule the follow-up appointment.  Your physician has recommended you make the following change in your medication:  Use Viagra as needed as directed.  Check with your pharmacy on the cost of Xarelto. Call us if you want to change to this medication

## 2012-07-03 ENCOUNTER — Ambulatory Visit (INDEPENDENT_AMBULATORY_CARE_PROVIDER_SITE_OTHER): Payer: PRIVATE HEALTH INSURANCE | Admitting: Family Medicine

## 2012-07-03 ENCOUNTER — Encounter: Payer: Self-pay | Admitting: Family Medicine

## 2012-07-03 ENCOUNTER — Ambulatory Visit (INDEPENDENT_AMBULATORY_CARE_PROVIDER_SITE_OTHER): Payer: PRIVATE HEALTH INSURANCE | Admitting: Family

## 2012-07-03 VITALS — BP 130/80 | HR 89 | Temp 97.8°F | Resp 20 | Wt 278.0 lb

## 2012-07-03 DIAGNOSIS — N183 Chronic kidney disease, stage 3 unspecified: Secondary | ICD-10-CM

## 2012-07-03 DIAGNOSIS — E349 Endocrine disorder, unspecified: Secondary | ICD-10-CM

## 2012-07-03 DIAGNOSIS — E291 Testicular hypofunction: Secondary | ICD-10-CM

## 2012-07-03 DIAGNOSIS — E1140 Type 2 diabetes mellitus with diabetic neuropathy, unspecified: Secondary | ICD-10-CM | POA: Insufficient documentation

## 2012-07-03 DIAGNOSIS — E785 Hyperlipidemia, unspecified: Secondary | ICD-10-CM

## 2012-07-03 DIAGNOSIS — N184 Chronic kidney disease, stage 4 (severe): Secondary | ICD-10-CM

## 2012-07-03 DIAGNOSIS — E1149 Type 2 diabetes mellitus with other diabetic neurological complication: Secondary | ICD-10-CM

## 2012-07-03 DIAGNOSIS — E1165 Type 2 diabetes mellitus with hyperglycemia: Secondary | ICD-10-CM

## 2012-07-03 DIAGNOSIS — I480 Paroxysmal atrial fibrillation: Secondary | ICD-10-CM

## 2012-07-03 DIAGNOSIS — I4891 Unspecified atrial fibrillation: Secondary | ICD-10-CM

## 2012-07-03 DIAGNOSIS — I10 Essential (primary) hypertension: Secondary | ICD-10-CM

## 2012-07-03 DIAGNOSIS — Z7901 Long term (current) use of anticoagulants: Secondary | ICD-10-CM

## 2012-07-03 DIAGNOSIS — I2510 Atherosclerotic heart disease of native coronary artery without angina pectoris: Secondary | ICD-10-CM

## 2012-07-03 LAB — BASIC METABOLIC PANEL
CO2: 30 mEq/L (ref 19–32)
Chloride: 92 mEq/L — ABNORMAL LOW (ref 96–112)
Potassium: 4.5 mEq/L (ref 3.5–5.1)
Sodium: 131 mEq/L — ABNORMAL LOW (ref 135–145)

## 2012-07-03 LAB — LIPID PANEL
HDL: 38.7 mg/dL — ABNORMAL LOW (ref >39.00)
Total CHOL/HDL Ratio: 6
Triglycerides: 383 mg/dL — ABNORMAL HIGH (ref 0.0–149.0)

## 2012-07-03 LAB — HM DIABETES FOOT EXAM

## 2012-07-03 LAB — HEPATIC FUNCTION PANEL
Albumin: 4 g/dL (ref 3.5–5.2)
Total Bilirubin: 0.5 mg/dL (ref 0.3–1.2)

## 2012-07-03 LAB — HEMOGLOBIN A1C: Hgb A1c MFr Bld: 9.7 % — ABNORMAL HIGH (ref 4.6–6.5)

## 2012-07-03 MED ORDER — TESTOSTERONE CYPIONATE 100 MG/ML IM SOLN
200.0000 mg | INTRAMUSCULAR | Status: DC
  Administered 2012-07-03: 200 mg via INTRAMUSCULAR

## 2012-07-03 NOTE — Patient Instructions (Signed)
Hold Coumadin today only.  Continue dose of 1 pill everyday except 1.5 pill on Mondays and Fridays. Recheck in 2 weeks. Anticoagulation Dose Instructions as of 07/03/2012     Dorene Grebe Tue Wed Thu Fri Sat   New Dose 5 mg 7.5 mg 5 mg 5 mg 5 mg 7.5 mg 5 mg    Description       Hold Coumadin today only.  Continue dose of 1 pill everyday except 1.5 pill on Mondays and Fridays. Recheck in 2 weeks.

## 2012-07-03 NOTE — Patient Instructions (Addendum)
Stopped diclofenac. This increases risk of gastrointestinal bleed on Coumadin and also increased risk of kidney damage Check with your insurance company to see if there are alternatives for Lantus that are less expensive Consider regular use of cane or other support to improve balance

## 2012-07-03 NOTE — Addendum Note (Signed)
Addended by: Marian Sorrow on: 07/03/2012 10:36 AM   Modules accepted: Orders

## 2012-07-03 NOTE — Progress Notes (Signed)
Subjective:    Patient ID: Gary Yates., male    DOB: 1933-11-20, 79 y.o.   MRN: [id number]  HPI Medical followup. Patient history of obesity, poor compliance, type 2 diabetes with complications of neuropathy, CAD history, peripheral vascular disease history, hypertension, history of A. fib, dyslipidemia, stage III chronic kidney disease.  Very poor poor compliance. Recently ran out of Lantus 2 weeks ago because of cost issues. Fasting blood sugars run 240 since then. He is currently in a doughnut hole. He has not checked with his insurance to see other alternatives that may be less expensive. He does remain on Amaryl 4 mg daily  Low testosterone and is not sure at times this is making him feel better. He has been taking regularly.  Recently place per orthopedist on diclofenac and we explained risk with his chronic disease and also risk of bleeding on Coumadin and is highly advised him to stop this.  Feels off balance with walking and especially when standing with eyes closed. He has neuropathy from diabetes which we suspect is contributing  Past Medical History  Diagnosis Date  . Coronary artery disease     a. h/o Overlapping stents RCA;  b. 06/2011 Cath: patent stents, nonobs dzs, NL EF.  Marland Kitchen Hypertension   . Osteoarthritis     shoulder  . Restless leg   . Hyperlipidemia   . COPD (chronic obstructive pulmonary disease)   . Nephrolithiasis   . SVT (supraventricular tachycardia)   . Dyspnea   . GERD (gastroesophageal reflux disease)   . DM (diabetes mellitus)     Type 2, peripheral neuropathy.  . Diabetic peripheral neuropathy   . Atrial fibrillation   . Low testosterone    Past Surgical History  Procedure Laterality Date  . Cholecystectomy      reports that he quit smoking about 29 years ago. His smoking use included Cigarettes. He has a 30 pack-year smoking history. He has never used smokeless tobacco. He reports that he does not drink alcohol or use illicit drugs. family  history includes Alzheimer's disease in his mother; Arthritis in his brother and sister; Coronary artery disease in some unspecified family members; Heart disease in his brother, father, mother, and sister; Migraines in his daughter and father; Obesity in his sister and son; Sleep apnea in his son; Thyroid disease in his daughter; and Ulcers in his father. Allergies  Allergen Reactions  . Ace Inhibitors Other (See Comments)    cough  . Codeine Nausea Only and Rash       . Penicillins Rash      Review of Systems  Constitutional: Positive for fatigue. Negative for unexpected weight change.  Eyes: Negative for visual disturbance.  Respiratory: Negative for cough, chest tightness and shortness of breath.   Cardiovascular: Negative for chest pain, palpitations and leg swelling.  Musculoskeletal: Positive for back pain.  Neurological: Positive for dizziness. Negative for syncope, weakness, light-headedness and headaches.       Objective:   Physical Exam  Constitutional: He is oriented to person, place, and time. He appears well-developed and well-nourished.  Neck: Neck supple. No thyromegaly present.  Cardiovascular: Normal rate and regular rhythm.   Pulmonary/Chest: Effort normal and breath sounds normal. No respiratory distress. He has no wheezes. He has no rales.  Musculoskeletal: He exhibits no edema.  Neurological: He is alert and oriented to person, place, and time.  Abnormal Romberg test  Skin:  Patient has impaired sensory function to monofilament testing. No lesions. Good dorsalis  pedis pulses          Assessment & Plan:  #1 type 2 diabetes. History of poor compliance and poor control. Currently not taking any insulin. He is strongly advised to check with insurance to see less expensive alternative. We'll go ahead and check A1c today that we strongly suspect this will be elevated. We explained to him that not taking any insulin is not a reasonable alternative #2  dyslipidemia. History of CAD. Check lipid and hepatic panel #3 hypertension fairly well controlled #4 gait disturbance. Positive Romberg test. Suspect largely related to his peripheral neuropathy. He is advised to use alternative support such as a cane

## 2012-07-08 ENCOUNTER — Other Ambulatory Visit: Payer: Self-pay

## 2012-07-08 MED ORDER — ATORVASTATIN CALCIUM 20 MG PO TABS: 20.0000 mg | ORAL_TABLET | Freq: Every day | ORAL | Status: DC

## 2012-07-13 ENCOUNTER — Other Ambulatory Visit: Payer: Self-pay | Admitting: *Deleted

## 2012-07-13 MED ORDER — INSULIN GLARGINE 100 UNIT/ML SOLOSTAR PEN: PEN_INJECTOR | SUBCUTANEOUS | Status: DC

## 2012-07-13 NOTE — Telephone Encounter (Signed)
Rx request to mail order pharmacy/[facility]  

## 2012-07-14 ENCOUNTER — Telehealth: Payer: Self-pay | Admitting: General Practice

## 2012-07-14 NOTE — Telephone Encounter (Signed)
Pt has cancelled his protime for 07-16-12. Pt stated he will callback to reschedule

## 2012-07-16 ENCOUNTER — Ambulatory Visit: Payer: PRIVATE HEALTH INSURANCE

## 2012-07-20 ENCOUNTER — Telehealth: Payer: Self-pay | Admitting: Family Medicine

## 2012-07-20 NOTE — Telephone Encounter (Signed)
Please advise 

## 2012-07-20 NOTE — Telephone Encounter (Signed)
Pt states he was told to get off of the Diclofenac 75mg  d/t coumadin use and renal insufficiency. He spoke with his Ortho doc, and they recommended the Flector patch. They want to know if that would be ok, (prefers a generic of that patch)? If so, please call a rx for this in to: [facility].

## 2012-07-21 NOTE — Telephone Encounter (Signed)
Informed patient per Dr. Elease Hashimoto. Pt stated he would call his Ortho doctor

## 2012-07-21 NOTE — Telephone Encounter (Signed)
Even Flector patch (topical medication) has warnings and cautions with severe renal disease.  Ortho can prescribe but he has to aware of increased risks with renal impairment.

## 2012-07-23 ENCOUNTER — Other Ambulatory Visit: Payer: Self-pay | Admitting: Physical Medicine and Rehabilitation

## 2012-07-23 DIAGNOSIS — M549 Dorsalgia, unspecified: Secondary | ICD-10-CM

## 2012-07-23 NOTE — Telephone Encounter (Signed)
Pt has rsc appt to 07-27-12 830am

## 2012-07-27 ENCOUNTER — Ambulatory Visit (INDEPENDENT_AMBULATORY_CARE_PROVIDER_SITE_OTHER): Payer: Medicare Other | Admitting: General Practice

## 2012-07-27 DIAGNOSIS — I4891 Unspecified atrial fibrillation: Secondary | ICD-10-CM

## 2012-07-27 DIAGNOSIS — I480 Paroxysmal atrial fibrillation: Secondary | ICD-10-CM

## 2012-07-27 DIAGNOSIS — Z7901 Long term (current) use of anticoagulants: Secondary | ICD-10-CM

## 2012-07-28 ENCOUNTER — Ambulatory Visit
Admission: RE | Admit: 2012-07-28 | Discharge: 2012-07-28 | Disposition: A | Discharge status: Home / Self Care | Payer: Medicare Other | Source: Ambulatory Visit | Attending: Physical Medicine and Rehabilitation | Admitting: Physical Medicine and Rehabilitation

## 2012-07-28 DIAGNOSIS — M549 Dorsalgia, unspecified: Secondary | ICD-10-CM

## 2012-07-28 MED ORDER — IOHEXOL 300 MG/ML  SOLN
1.0000 mL | Freq: Once | INTRAMUSCULAR | Status: AC | PRN
  Administered 2012-07-28: 1 mL via EPIDURAL

## 2012-07-28 MED ORDER — TRIAMCINOLONE ACETONIDE 40 MG/ML IJ SUSP (RADIOLOGY)
60.0000 mg | Freq: Once | INTRAMUSCULAR | Status: AC
  Administered 2012-07-28: 60 mg via EPIDURAL

## 2012-07-28 NOTE — Progress Notes (Signed)
Pt instructed to resume coumadin today.

## 2012-07-28 NOTE — Discharge Instructions (Signed)
Post Procedure Spinal Discharge Instruction Sheet  1. You may resume a regular diet and any medications that you routinely take (including pain medications).  2. No driving day of procedure.  3. Light activity throughout the rest of the day.  Do not do any strenuous work, exercise, bending or lifting.  The day following the procedure, you can resume normal physical activity but you should refrain from exercising or physical therapy for at least three days thereafter.   Common Side Effects:   Headaches- take your usual medications as directed by your physician.  Increase your fluid intake.  Caffeinated beverages may be helpful.  Lie flat in bed until your headache resolves.   Restlessness or inability to sleep- you may have trouble sleeping for the next few days.  Ask your referring physician if you need any medication for sleep.   Facial flushing or redness- should subside within a few days.   Increased pain- a temporary increase in pain a day or two following your procedure is not unusual.  Take your pain medication as prescribed by your referring physician.   Leg cramps  Please contact our office at [phone] for the following symptoms:  Fever greater than 100 degrees.  Headaches unresolved with medication after 2-3 days.  Increased swelling, pain, or redness at injection site.  Thank you for visiting our office.   May resume coumadin today. 

## 2012-07-29 ENCOUNTER — Telehealth: Payer: Self-pay | Admitting: Family Medicine

## 2012-07-29 NOTE — Telephone Encounter (Signed)
He has been given Dialudid in past and this would be safer than Diclofenac.  Obviously, recommend NO pain meds unless having severe pain.

## 2012-07-29 NOTE — Telephone Encounter (Signed)
[place]ary Phone/([phone].   Had 2nd epidural for back pain 07/28/12 at [place] per referral from Dr Velia Meyer at [facility]. Called regarding a "medication controversy" regarding pain medication to use prn.   Currently has no back pain.   Previously advised by Dr Elease Hashimoto not to use Diclofenac due to renal effects and use of Coumadin.  The MD who administered the epidural and Dr Velia Meyer were unable to determine the best prn pain medication to use so advised Gary Yates to call Dr. Elease Hashimoto.  Please call back on cell [phone].

## 2012-07-30 NOTE — Telephone Encounter (Signed)
Just a update.Marland KitchenMarland KitchenPt stated that Dr. Velia Meyer was saying that if his pain becomes more progressive to try some new medication. Pt stated that if he is in pain he take tylenol to help ease the pain. Pt comes in September for a office visit.

## 2012-08-03 ENCOUNTER — Other Ambulatory Visit: Payer: Self-pay | Admitting: General Practice

## 2012-08-03 ENCOUNTER — Ambulatory Visit (INDEPENDENT_AMBULATORY_CARE_PROVIDER_SITE_OTHER): Payer: Medicare Other | Admitting: General Practice

## 2012-08-03 DIAGNOSIS — I480 Paroxysmal atrial fibrillation: Secondary | ICD-10-CM

## 2012-08-03 DIAGNOSIS — Z7901 Long term (current) use of anticoagulants: Secondary | ICD-10-CM

## 2012-08-03 DIAGNOSIS — I4891 Unspecified atrial fibrillation: Secondary | ICD-10-CM

## 2012-08-03 MED ORDER — WARFARIN SODIUM 5 MG PO TABS: ORAL_TABLET | ORAL | Status: DC

## 2012-08-11 ENCOUNTER — Telehealth: Payer: Self-pay | Admitting: General Practice

## 2012-08-11 NOTE — Telephone Encounter (Signed)
Pt cancelled protime appt and will callback to resch

## 2012-08-13 ENCOUNTER — Ambulatory Visit: Payer: Medicare Other

## 2012-08-31 ENCOUNTER — Ambulatory Visit (INDEPENDENT_AMBULATORY_CARE_PROVIDER_SITE_OTHER): Payer: Medicare Other | Admitting: General Practice

## 2012-08-31 ENCOUNTER — Ambulatory Visit (INDEPENDENT_AMBULATORY_CARE_PROVIDER_SITE_OTHER): Payer: Medicare Other | Admitting: Family Medicine

## 2012-08-31 DIAGNOSIS — Z7901 Long term (current) use of anticoagulants: Secondary | ICD-10-CM

## 2012-08-31 DIAGNOSIS — R7989 Other specified abnormal findings of blood chemistry: Secondary | ICD-10-CM

## 2012-08-31 DIAGNOSIS — I480 Paroxysmal atrial fibrillation: Secondary | ICD-10-CM

## 2012-08-31 DIAGNOSIS — I4891 Unspecified atrial fibrillation: Secondary | ICD-10-CM

## 2012-08-31 DIAGNOSIS — E291 Testicular hypofunction: Secondary | ICD-10-CM

## 2012-08-31 LAB — POCT INR: INR: 1.3

## 2012-08-31 MED ORDER — TESTOSTERONE CYPIONATE 200 MG/ML IM SOLN
200.0000 mg | Freq: Once | INTRAMUSCULAR | Status: AC
  Administered 2012-08-31: 200 mg via INTRAMUSCULAR

## 2012-09-10 ENCOUNTER — Ambulatory Visit: Payer: Medicare Other

## 2012-09-14 ENCOUNTER — Ambulatory Visit: Payer: Medicare Other

## 2012-09-17 ENCOUNTER — Other Ambulatory Visit: Payer: Self-pay

## 2012-09-17 MED ORDER — PRAMIPEXOLE DIHYDROCHLORIDE 0.125 MG PO TABS: ORAL_TABLET | ORAL | Status: DC

## 2012-09-17 MED ORDER — AMITRIPTYLINE HCL 25 MG PO TABS: ORAL_TABLET | ORAL | Status: DC

## 2012-09-17 MED ORDER — ATORVASTATIN CALCIUM 20 MG PO TABS: 20.0000 mg | ORAL_TABLET | Freq: Every day | ORAL | Status: DC

## 2012-09-17 MED ORDER — WARFARIN SODIUM 5 MG PO TABS: ORAL_TABLET | ORAL | Status: DC

## 2012-09-21 ENCOUNTER — Ambulatory Visit (INDEPENDENT_AMBULATORY_CARE_PROVIDER_SITE_OTHER): Payer: Medicare Other | Admitting: Family Medicine

## 2012-09-21 ENCOUNTER — Encounter: Payer: Self-pay | Admitting: Family Medicine

## 2012-09-21 ENCOUNTER — Ambulatory Visit (INDEPENDENT_AMBULATORY_CARE_PROVIDER_SITE_OTHER): Payer: Medicare Other | Admitting: General Practice

## 2012-09-21 VITALS — BP 132/82 | HR 93 | Temp 98.0°F | Wt 278.0 lb

## 2012-09-21 DIAGNOSIS — E1140 Type 2 diabetes mellitus with diabetic neuropathy, unspecified: Secondary | ICD-10-CM

## 2012-09-21 DIAGNOSIS — E1149 Type 2 diabetes mellitus with other diabetic neurological complication: Secondary | ICD-10-CM

## 2012-09-21 DIAGNOSIS — Z7901 Long term (current) use of anticoagulants: Secondary | ICD-10-CM

## 2012-09-21 DIAGNOSIS — I480 Paroxysmal atrial fibrillation: Secondary | ICD-10-CM

## 2012-09-21 DIAGNOSIS — I4891 Unspecified atrial fibrillation: Secondary | ICD-10-CM

## 2012-09-21 DIAGNOSIS — N183 Chronic kidney disease, stage 3 unspecified: Secondary | ICD-10-CM

## 2012-09-21 DIAGNOSIS — E669 Obesity, unspecified: Secondary | ICD-10-CM | POA: Insufficient documentation

## 2012-09-21 DIAGNOSIS — Z9181 History of falling: Secondary | ICD-10-CM

## 2012-09-21 DIAGNOSIS — E785 Hyperlipidemia, unspecified: Secondary | ICD-10-CM

## 2012-09-21 DIAGNOSIS — G4700 Insomnia, unspecified: Secondary | ICD-10-CM

## 2012-09-21 NOTE — Progress Notes (Signed)
Subjective:    Patient ID: Gary Yates., male    DOB: Nov 28, 1933, 79 y.o.   MRN: [id number]  HPI Patient seen for medical followup  He's had some recent balance issues. He's had a couple recent falls. He has a long-standing history of type 2 diabetes and has some significant peripheral neuropathy. He is currently seeing physical therapist for some back pain issues and is requesting physical therapy for balance issues as well. He's never had any syncope. No vertigo.  We reviewed all his chronic medications. He has for several years been on amitriptyline and have discussed previously trying to get him off this. He is not currently describing any orthostatic type symptoms. He apparently had right mild foot drop and physical therapist is working on strengthening in that area.  Type 2 diabetes which has been poorly controlled. Not checking blood sugars consistently. Occasional poor compliance of medications-insulin has been difficult secondary to cost.  He has some chronic insomnia issues. However, he is napping significantly during the day.  Past Medical History  Diagnosis Date  . Coronary artery disease     a. h/o Overlapping stents RCA;  b. 06/2011 Cath: patent stents, nonobs dzs, NL EF.  Marland Kitchen Hypertension   . Osteoarthritis     shoulder  . Restless leg   . Hyperlipidemia   . COPD (chronic obstructive pulmonary disease)   . Nephrolithiasis   . SVT (supraventricular tachycardia)   . Dyspnea   . GERD (gastroesophageal reflux disease)   . DM (diabetes mellitus)     Type 2, peripheral neuropathy.  . Diabetic peripheral neuropathy   . Atrial fibrillation   . Low testosterone    Past Surgical History  Procedure Laterality Date  . Cholecystectomy      reports that he quit smoking about 29 years ago. His smoking use included Cigarettes. He has a 30 pack-year smoking history. He has never used smokeless tobacco. He reports that he does not drink alcohol or use illicit drugs. family  history includes Alzheimer's disease in his mother; Arthritis in his brother and sister; Coronary artery disease in some other family members; Heart disease in his brother, father, mother, and sister; Migraines in his daughter and father; Obesity in his sister and son; Sleep apnea in his son; Thyroid disease in his daughter; Ulcers in his father. Allergies  Allergen Reactions  . Ace Inhibitors Other (See Comments)    cough  . Codeine Nausea Only and Rash       . Penicillins Rash      Review of Systems  Constitutional: Positive for fatigue. Negative for appetite change and unexpected weight change.  Eyes: Negative for visual disturbance.  Respiratory: Negative for cough, chest tightness and shortness of breath.   Cardiovascular: Negative for chest pain and palpitations.  Gastrointestinal: Negative for abdominal pain.  Endocrine: Negative for polydipsia and polyuria.  Genitourinary: Negative for dysuria.  Neurological: Negative for dizziness, syncope, weakness, light-headedness and headaches.  Psychiatric/Behavioral: Positive for sleep disturbance. Negative for confusion.       Objective:   Physical Exam  Constitutional: He is oriented to person, place, and time. He appears well-developed and well-nourished.  Cardiovascular: Normal rate and regular rhythm.   Pulmonary/Chest: Effort normal and breath sounds normal. No respiratory distress. He has no wheezes. He has no rales.  Neurological: He is alert and oriented to person, place, and time.  Patient has impaired sensory function to touch lower extremities. He has mild to moderate weakness with dorsi flexion on the  right compared to the left.  Psychiatric: He has a normal mood and affect. His behavior is normal.          Assessment & Plan:  #1 increased risk of falls. He has balance issues which are combination of neuropathy from his diabetes (with positive Romberg test on exam today) and also has mild right foot drop which is  contributing. Continue physical therapy for balance training. May need AFO brace #2 insomnia. Sleep hygiene discussed. Avoid daytime naps. Handout given #3 type 2 diabetes. History of poor control. Ordered future labs with repeat hemoglobin A1c #4 hyperlipidemia. History of poor control. Recent increase Lipitor to 20 mg. Repeat lipids with future labs in one month

## 2012-09-21 NOTE — Patient Instructions (Addendum)
Decrease Amitriptyline to one half tablet nightly for 1-2 weeks and then try to discontinue.  Insomnia Insomnia is frequent trouble falling and/or staying asleep. Insomnia can be a long term problem or a short term problem. Both are common. Insomnia can be a short term problem when the wakefulness is related to a certain stress or worry. Long term insomnia is often related to ongoing stress during waking hours and/or poor sleeping habits. Overtime, sleep deprivation itself can make the problem worse. Every little thing feels more severe because you are overtired and your ability to cope is decreased. CAUSES   Stress, anxiety, and depression.  Poor sleeping habits.  Distractions such as TV in the bedroom.  Naps close to bedtime.  Engaging in emotionally charged conversations before bed.  Technical reading before sleep.  Alcohol and other sedatives. They may make the problem worse. They can hurt normal sleep patterns and normal dream activity.  Stimulants such as caffeine for several hours prior to bedtime.  Pain syndromes and shortness of breath can cause insomnia.  Exercise late at night.  Changing time zones may cause sleeping problems (jet lag). It is sometimes helpful to have someone observe your sleeping patterns. They should look for periods of not breathing during the night (sleep apnea). They should also look to see how long those periods last. If you live alone or observers are uncertain, you can also be observed at a sleep clinic where your sleep patterns will be professionally monitored. Sleep apnea requires a checkup and treatment. Give your caregivers your medical history. Give your caregivers observations your family has made about your sleep.  SYMPTOMS   Not feeling rested in the morning.  Anxiety and restlessness at bedtime.  Difficulty falling and staying asleep. TREATMENT   Your caregiver may prescribe treatment for an underlying medical disorders. Your caregiver  can give advice or help if you are using alcohol or other drugs for self-medication. Treatment of underlying problems will usually eliminate insomnia problems.  Medications can be prescribed for short time use. They are generally not recommended for lengthy use.  Over-the-counter sleep medicines are not recommended for lengthy use. They can be habit forming.  You can promote easier sleeping by making lifestyle changes such as:  Using relaxation techniques that help with breathing and reduce muscle tension.  Exercising earlier in the day.  Changing your diet and the time of your last meal. No night time snacks.  Establish a regular time to go to bed.  Counseling can help with stressful problems and worry.  Soothing music and white noise may be helpful if there are background noises you cannot remove.  Stop tedious detailed work at least one hour before bedtime. HOME CARE INSTRUCTIONS   Keep a diary. Inform your caregiver about your progress. This includes any medication side effects. See your caregiver regularly. Take note of:  Times when you are asleep.  Times when you are awake during the night.  The quality of your sleep.  How you feel the next day. This information will help your caregiver care for you.  Get out of bed if you are still awake after 15 minutes. Read or do some quiet activity. Keep the lights down. Wait until you feel sleepy and go back to bed.  Keep regular sleeping and waking hours. Avoid naps.  Exercise regularly.  Avoid distractions at bedtime. Distractions include watching television or engaging in any intense or detailed activity like attempting to balance the household checkbook.  Develop a bedtime  ritual. Keep a familiar routine of bathing, brushing your teeth, climbing into bed at the same time each night, listening to soothing music. Routines increase the success of falling to sleep faster.  Use relaxation techniques. This can be using breathing  and muscle tension release routines. It can also include visualizing peaceful scenes. You can also help control troubling or intruding thoughts by keeping your mind occupied with boring or repetitive thoughts like the old concept of counting sheep. You can make it more creative like imagining planting one beautiful flower after another in your backyard garden.  During your day, work to eliminate stress. When this is not possible use some of the previous suggestions to help reduce the anxiety that accompanies stressful situations. MAKE SURE YOU:   Understand these instructions.  Will watch your condition.  Will get help right away if you are not doing well or get worse. Document Released: 12/22/1999 Document Revised: 03/18/2011 Document Reviewed: 01/21/2007 [facility] Patient Information 2014 [place].

## 2012-09-22 ENCOUNTER — Other Ambulatory Visit: Payer: Self-pay

## 2012-09-22 MED ORDER — INSULIN GLARGINE 100 UNIT/ML ~~LOC~~ SOLN: 44.0000 [IU] | Freq: Two times a day (BID) | SUBCUTANEOUS | Status: DC

## 2012-10-01 ENCOUNTER — Encounter: Payer: PRIVATE HEALTH INSURANCE | Admitting: Family

## 2012-10-01 ENCOUNTER — Ambulatory Visit: Payer: PRIVATE HEALTH INSURANCE | Admitting: Family Medicine

## 2012-10-01 ENCOUNTER — Other Ambulatory Visit: Payer: Self-pay | Admitting: Family Medicine

## 2012-10-19 ENCOUNTER — Other Ambulatory Visit: Payer: Medicare Other

## 2012-10-19 ENCOUNTER — Ambulatory Visit (INDEPENDENT_AMBULATORY_CARE_PROVIDER_SITE_OTHER): Payer: Medicare Other | Admitting: General Practice

## 2012-10-19 DIAGNOSIS — Z7901 Long term (current) use of anticoagulants: Secondary | ICD-10-CM

## 2012-10-19 DIAGNOSIS — E1140 Type 2 diabetes mellitus with diabetic neuropathy, unspecified: Secondary | ICD-10-CM

## 2012-10-19 DIAGNOSIS — I480 Paroxysmal atrial fibrillation: Secondary | ICD-10-CM

## 2012-10-19 DIAGNOSIS — E1149 Type 2 diabetes mellitus with other diabetic neurological complication: Secondary | ICD-10-CM

## 2012-10-19 DIAGNOSIS — N183 Chronic kidney disease, stage 3 unspecified: Secondary | ICD-10-CM

## 2012-10-19 DIAGNOSIS — I4891 Unspecified atrial fibrillation: Secondary | ICD-10-CM

## 2012-10-19 DIAGNOSIS — E785 Hyperlipidemia, unspecified: Secondary | ICD-10-CM

## 2012-10-19 LAB — BASIC METABOLIC PANEL
Calcium: 9 mg/dL (ref 8.4–10.5)
GFR: 58.33 mL/min — ABNORMAL LOW (ref >60.00)
Potassium: 3.7 mEq/L (ref 3.5–5.1)
Sodium: 137 mEq/L (ref 135–145)

## 2012-10-19 LAB — LIPID PANEL
Cholesterol: 258 mg/dL — ABNORMAL HIGH (ref 0–200)
HDL: 38.5 mg/dL — ABNORMAL LOW (ref >39.00)
Triglycerides: 353 mg/dL — ABNORMAL HIGH (ref 0.0–149.0)
VLDL: 70.6 mg/dL — ABNORMAL HIGH (ref 0.0–40.0)

## 2012-10-19 LAB — POCT INR: INR: 3.7

## 2012-10-19 LAB — HEPATIC FUNCTION PANEL
ALT: 19 U/L (ref 0–53)
Albumin: 3.6 g/dL (ref 3.5–5.2)
Total Protein: 6.9 g/dL (ref 6.0–8.3)

## 2012-10-19 LAB — LDL CHOLESTEROL, DIRECT: Direct LDL: 161.2 mg/dL

## 2012-10-20 ENCOUNTER — Telehealth: Payer: Self-pay | Admitting: Family Medicine

## 2012-10-20 ENCOUNTER — Emergency Department (HOSPITAL_COMMUNITY)
Admission: EM | Admit: 2012-10-20 | Discharge: 2012-10-20 | Disposition: A | Discharge status: Home / Self Care | Payer: Medicare Other | Source: Home / Self Care | Attending: Family Medicine | Admitting: Family Medicine

## 2012-10-20 ENCOUNTER — Encounter (HOSPITAL_COMMUNITY): Payer: Self-pay | Admitting: Emergency Medicine

## 2012-10-20 DIAGNOSIS — R0982 Postnasal drip: Secondary | ICD-10-CM

## 2012-10-20 MED ORDER — IPRATROPIUM BROMIDE 0.03 % NA SOLN: 2.0000 | Freq: Two times a day (BID) | NASAL | Status: DC

## 2012-10-20 NOTE — ED Notes (Signed)
Provider in before nurse.  Provider triaged and assessed pt.

## 2012-10-20 NOTE — Telephone Encounter (Signed)
Pt states he has hoarseness and sore throat with cough since yesterday.  He wants to know what he can take for relief. Pt states he has not traveled outside of the country.  He was advised PCP is out of office this afternoon and request will not be addressed until tomorrow when physician returns.  Pt declined to hold for triage.

## 2012-10-20 NOTE — ED Provider Notes (Signed)
Gary Yates. is a 79 y.o. male who presents to Urgent Care today for sore throat cough congestion and runny nose present over the last day or 2. No fevers or chills trouble breathing nausea vomiting or diarrhea. No medications tried. Feels well otherwise. No chest pains or palpitations.   Past Medical History  Diagnosis Date  . Coronary artery disease     a. h/o Overlapping stents RCA;  b. 06/2011 Cath: patent stents, nonobs dzs, NL EF.  Marland Kitchen Hypertension   . Osteoarthritis     shoulder  . Restless leg   . Hyperlipidemia   . COPD (chronic obstructive pulmonary disease)   . Nephrolithiasis   . SVT (supraventricular tachycardia)   . Dyspnea   . GERD (gastroesophageal reflux disease)   . DM (diabetes mellitus)     Type 2, peripheral neuropathy.  . Diabetic peripheral neuropathy   . Atrial fibrillation   . Low testosterone    History  Substance Use Topics  . Smoking status: Former Smoker -- 1.50 packs/day for 20 years    Types: Cigarettes    Quit date: 04/05/1983  . Smokeless tobacco: Never Used  . Alcohol Use: No   ROS as above Medications reviewed. No current facility-administered medications for this encounter.   Current Outpatient Prescriptions  Medication Sig Dispense Refill  . amitriptyline (ELAVIL) 25 MG tablet TAKE 1 TABLET BY MOUTH AT BEDTIME  90 tablet  3  . aspirin 81 MG tablet Take 81 mg by mouth daily.        Marland Kitchen atorvastatin (LIPITOR) 20 MG tablet Take 1 tablet (20 mg total) by mouth daily.  90 tablet  3  . Calcium Carbonate-Vitamin D (CALCIUM + D PO) Take by mouth daily.        . fenofibrate micronized (LOFIBRA) 134 MG capsule Take 1 capsule by mouth  daily before breakfast  90 capsule  3  . gabapentin (NEURONTIN) 300 MG capsule       . glimepiride (AMARYL) 4 MG tablet Take 1 tablet by mouth  daily before breakfast  90 tablet  3  . Insulin Glargine (LANTUS SOLOSTAR) 100 UNIT/ML SOPN INJECT 40 UNITS INTO THE SKIN 2 (TWO) TIMES DAILY.  30 pen  1  . metoprolol  succinate (TOPROL-XL) 100 MG 24 hr tablet Take 1 tablet by mouth  every day immediately  following a meal  90 tablet  3  . omeprazole (PRILOSEC) 20 MG capsule Take 1 capsule by mouth  daily  90 capsule  3  . pramipexole (MIRAPEX) 0.125 MG tablet 1 TABLET NIGHTLY FOR ONE WEEK THEN MAY TITRATE UP TO 2 TABLETS NIGHTLY  60 tablet  3  . warfarin (COUMADIN) 5 MG tablet Take as directed by coumadin clinic  45 tablet  3  . ACCU-CHEK AVIVA PLUS test strip TEST 3 TIMES A DAY  100 strip  11  . glucose blood (ACCU-CHEK AVIVA PLUS) test strip Test BS 3 times daily  100 each  6  . HYDROmorphone (DILAUDID) 2 MG tablet prn      . insulin glargine (LANTUS) 100 UNIT/ML injection Inject 0.44 mLs (44 Units total) into the skin 2 (two) times daily.  15 pen  3  . ipratropium (ATROVENT) 0.03 % nasal spray Place 2 sprays into the nose every 12 (twelve) hours.  30 mL  1  . Lancets (ACCU-CHEK MULTICLIX) lancets Use twice daily as instructed  204 each  3  . lisinopril (PRINIVIL,ZESTRIL) 5 MG tablet Take 1 tablet by mouth  daily  90 tablet  3  . nitroGLYCERIN (NITROSTAT) 0.4 MG SL tablet Place 0.4 mg under the tongue every 5 (five) minutes as needed. Chest pain      . pramipexole (MIRAPEX) 0.125 MG tablet 1 TABLET NIGHTLY FOR ONE WEEK THEN MAY TITRATE UP TO 2 TABLETS NIGHTLY  90 tablet  3  . sildenafil (VIAGRA) 50 MG tablet Take 1 tablet (50 mg total) by mouth daily as needed for erectile dysfunction.  6 tablet  3  . testosterone cypionate (DEPO-TESTOSTERONE) 200 MG/ML injection Inject 1 mL (200 mg total) into the muscle every 28 (twenty-eight) days.  10 mL  0  . tiotropium (SPIRIVA) 18 MCG inhalation capsule Place 18 mcg into inhaler and inhale daily.      . [DISCONTINUED] metFORMIN (GLUCOPHAGE) 500 MG tablet Take 1 tablet (500 mg total) by mouth 2 (two) times daily with a meal.  60 tablet  1  . [DISCONTINUED] metoprolol (LOPRESSOR) 50 MG tablet       . [DISCONTINUED] spironolactone (ALDACTONE) 25 MG tablet         Facility-Administered Medications Ordered in Other Encounters  Medication Dose Route Frequency Provider Last Rate Last Dose  . testosterone cypionate (DEPOTESTOTERONE CYPIONATE) injection 200 mg  200 mg Intramuscular Q28 days [name], MD   200 mg at 12/26/11 0850  . testosterone cypionate (DEPOTESTOTERONE CYPIONATE) injection 200 mg  200 mg Intramuscular Q28 days [name], MD   200 mg at 01/23/12 0908  . testosterone cypionate (DEPOTESTOTERONE CYPIONATE) injection 200 mg  200 mg Intramuscular Q28 days [name], MD   200 mg at 02/25/12 0848  . testosterone cypionate (DEPOTESTOTERONE CYPIONATE) injection 200 mg  200 mg Intramuscular Q28 days [name], MD   200 mg at 06/03/12 1437    Exam:  BP 158/80  Pulse 93  Temp(Src) 98.4 F (36.9 C) (Oral)  Resp 21  SpO2 95% Gen: Well NAD HEENT: EOMI,  MMM, clear nasal discharge. Posterior pharynx with cobblestoning. Tympanic membranes are normal appearing bilaterally Lungs: CTABL Nl WOB Heart: RRR no MRG Abd: NABS, NT, ND Exts: Non edematous BL  LE, warm and well perfused.   No results found for this or any previous visit (from the past 24 hour(s)). No results found.  Assessment and Plan: 79 y.o. male with posterior nasal drip. Plan to treat with Atrovent nasal spray. Additionally we'll treat cough with 0.25 mg total body as he already has these pills at home and is tolerance of them.  We'll followup with his primary care provider as needed.  Discussed warning signs or symptoms. Please see discharge instructions. Patient expresses understanding.      Gregor Hams, MD 10/20/12 2105

## 2012-10-20 NOTE — Telephone Encounter (Signed)
Patient Information:  Caller Name: [name]  Phone: [phone]  Patient: Gary Yates, Gary Yates  Gender: Male  DOB: Mar 28, 1933  Age: 79 Years  PCP: Carolann Littler (Family Practice)  Office Follow Up:  Does the office need to follow up with this patient?: Yes  Instructions For The Office: Asking if he needs pneumonia vaccine- thinks last pneumonia vaccine was in 2008   Symptoms  Reason For Call & Symptoms: L[place] states he started feeling bad - body aches , insomnia, stuffy nose, cough-onset 10/19/12- slept in recliner on 10/19/12. Onset of sore throat on 10/20/12. Slight body aches onset 10/20/12. Afebrile. Short of brreath at times -especially when he lays down. Has had flu vaccine in September. Asking if he needs Pneumonia vaccine?- last had vaccine in 2008.Per influenza -seasonal protocol has go to ED now or to office with PCP approval. Time is currently 16:13. Advised to be seen in [place]. States he will go to [facility] now.  Please call Peyson regarding need for Pneumomia vaccine at [phone].  Reviewed Health History In EMR: Yes  Reviewed Medications In EMR: Yes  Reviewed Allergies In EMR: Yes  Reviewed Surgeries / Procedures: Yes  Date of Onset of Symptoms: 10/19/2012  Treatments Tried: Chloroseptic throat spray.  Treatments Tried Worked: No  Guideline(s) Used:  Influenza - Seasonal  Disposition Per Guideline:   Go to ED Now (or to Office with PCP Approval)  Reason For Disposition Reached:   Difficulty breathing that is not severe and not relieved by cleaning out the nose  Advice Given:  N/A  Patient Will Follow Care Advice:  YES

## 2012-10-21 NOTE — Telephone Encounter (Signed)
Patient went to ED

## 2012-10-21 NOTE — Telephone Encounter (Signed)
Pt went to ED on 10/20/12 and this morning stated that he is feeling better and that he does have appointment to see you on next week.

## 2012-10-26 ENCOUNTER — Encounter: Payer: Self-pay | Admitting: Family Medicine

## 2012-10-26 ENCOUNTER — Ambulatory Visit (INDEPENDENT_AMBULATORY_CARE_PROVIDER_SITE_OTHER): Payer: Medicare Other | Admitting: Family Medicine

## 2012-10-26 VITALS — BP 132/74 | HR 92 | Temp 97.8°F | Wt 282.0 lb

## 2012-10-26 DIAGNOSIS — E785 Hyperlipidemia, unspecified: Secondary | ICD-10-CM

## 2012-10-26 DIAGNOSIS — R7989 Other specified abnormal findings of blood chemistry: Secondary | ICD-10-CM

## 2012-10-26 DIAGNOSIS — E1140 Type 2 diabetes mellitus with diabetic neuropathy, unspecified: Secondary | ICD-10-CM

## 2012-10-26 DIAGNOSIS — E1149 Type 2 diabetes mellitus with other diabetic neurological complication: Secondary | ICD-10-CM

## 2012-10-26 DIAGNOSIS — N183 Chronic kidney disease, stage 3 unspecified: Secondary | ICD-10-CM

## 2012-10-26 DIAGNOSIS — E291 Testicular hypofunction: Secondary | ICD-10-CM

## 2012-10-26 LAB — HM DIABETES EYE EXAM

## 2012-10-26 MED ORDER — TESTOSTERONE CYPIONATE 200 MG/ML IM SOLN
200.0000 mg | Freq: Once | INTRAMUSCULAR | Status: AC
  Administered 2012-10-26: 200 mg via INTRAMUSCULAR

## 2012-10-26 MED ORDER — ATORVASTATIN CALCIUM 40 MG PO TABS: 40.0000 mg | ORAL_TABLET | Freq: Every day | ORAL | Status: DC

## 2012-10-26 NOTE — Progress Notes (Signed)
Subjective:    Patient ID: Gary Yates., male    DOB: 1933-07-15, 79 y.o.   MRN: [id number]  HPI  Followup multiple medical problems. Patient has obesity, diabetes with neuropathy, chronic kidney disease, history of CAD, hypertension, history of atrial fibrillation. History of poor compliance. He does not check sugars regularly and these vary considerably when he does check. Last A1c poorly controlled 10.4%. He's had previous diabetic education but poor compliance at times.  Currently takes regimen of Lantus 50 units twice daily and also Amaryl 4 mg once daily. No recent hypoglycemia. No symptoms of polyuria or polydipsia. He does have some dry mouth takes chronic amitriptyline at night. He continues to fill balance at times. Had recent physical therapy. He has diabetic neuropathy which is likely contributing  Hyperlipidemia treated with Lipitor 20 mg daily. He states he is compliant with therapy. Recent lipids are reviewed and very poorly controlled. LDL 161.  Past Medical History  Diagnosis Date  . Coronary artery disease     a. h/o Overlapping stents RCA;  b. 06/2011 Cath: patent stents, nonobs dzs, NL EF.  Marland Kitchen Hypertension   . Osteoarthritis     shoulder  . Restless leg   . Hyperlipidemia   . COPD (chronic obstructive pulmonary disease)   . Nephrolithiasis   . SVT (supraventricular tachycardia)   . Dyspnea   . GERD (gastroesophageal reflux disease)   . DM (diabetes mellitus)     Type 2, peripheral neuropathy.  . Diabetic peripheral neuropathy   . Atrial fibrillation   . Low testosterone    Past Surgical History  Procedure Laterality Date  . Cholecystectomy      reports that he quit smoking about 29 years ago. His smoking use included Cigarettes. He has a 30 pack-year smoking history. He has never used smokeless tobacco. He reports that he does not drink alcohol or use illicit drugs. family history includes Alzheimer's disease in his mother; Arthritis in his brother and  sister; Coronary artery disease in some other family members; Heart disease in his brother, father, mother, and sister; Migraines in his daughter and father; Obesity in his sister and son; Sleep apnea in his son; Thyroid disease in his daughter; Ulcers in his father. Allergies  Allergen Reactions  . Ace Inhibitors Other (See Comments)    cough  . Codeine Nausea Only and Rash       . Penicillins Rash     Review of Systems  Constitutional: Negative for fatigue and unexpected weight change.  Eyes: Negative for visual disturbance.  Respiratory: Negative for cough, chest tightness and shortness of breath.   Cardiovascular: Negative for chest pain, palpitations and leg swelling.  Gastrointestinal: Negative for nausea and vomiting.  Endocrine: Negative for polydipsia and polyuria.  Neurological: Positive for dizziness. Negative for syncope, weakness, light-headedness and headaches.       Objective:   Physical Exam  Constitutional: He is oriented to person, place, and time. He appears well-developed and well-nourished.  HENT:  Mouth/Throat: Oropharynx is clear and moist.  Neck: Neck supple. No thyromegaly present.  Cardiovascular: Normal rate and regular rhythm.   Pulmonary/Chest: Effort normal and breath sounds normal. No respiratory distress. He has no wheezes. He has no rales.  Musculoskeletal: He exhibits no edema.  Neurological: He is alert and oriented to person, place, and time. No cranial nerve deficit.          Assessment & Plan:  #1 type 2 diabetes poorly controlled. We discussed options. Add NovoLog flex  pen 5 units twice daily with meals. Continue Lantus. Reassess A1c in 3 months. Discussed diabetes educator at this point he wishes to wait with his wife's health issues #2 hyperlipidemia poorly controlled. Increase Lipitor to 40 mg daily. Recheck lipids at followup #3 hypogonadism. Testosterone injection given

## 2012-10-26 NOTE — Patient Instructions (Signed)
Increase Lipitor to 40 mg once daily Start Novolog 5 units twice daily with meals.

## 2012-10-26 NOTE — Addendum Note (Signed)
Addended by: Marcina Millard on: 10/26/2012 09:21 AM   Modules accepted: Orders

## 2012-10-29 ENCOUNTER — Telehealth: Payer: Self-pay | Admitting: Family Medicine

## 2012-10-29 NOTE — Telephone Encounter (Signed)
Noted  

## 2012-10-29 NOTE — Telephone Encounter (Signed)
Patient Information:  Caller Name: Dreyson  Phone: [phone]  Patient: Gary Yates  Gender: Male  DOB: 1933-08-31  Age: 79 Years  PCP: Carolann Littler [facility])  Office Follow Up:  Does the office need to follow up with this patient?: No  Instructions For The Office: N/A  RN Note:  Pt. agrees to try Mucinex for his cough. Will try to find sugar free cough drops. Will call back for wheezing for any fever. Care Advice per the Guidelines.  Symptoms  Reason For Call & Symptoms: Went into see Dr. Elease Hashimoto 10/26/12 and told him about his cough. Went to UC on 10/22/12. Had postnasal drainage. Gave him a spray. It has helped. Pt. feels like he is not getting any better. Afebrile. Does report he feels fine in the daytime, but sitting up in recliner at HS due to cough. Phlegm is yellow/green. States does not want to go to see the Dr. Was just there.  Reviewed Health History In EMR: Yes  Reviewed Medications In EMR: Yes  Reviewed Allergies In EMR: Yes  Reviewed Surgeries / Procedures: Yes  Date of Onset of Symptoms: 10/22/2012  Treatments Tried: Nasal Spray  Treatments Tried Worked: Yes  Guideline(s) Used:  Cough  Disposition Per Guideline:   Home Care  Reason For Disposition Reached:   Cough with cold symptoms (e.g., runny nose, postnasal drip, throat clearing)  Advice Given:  Reassurance  Coughing is the way that our lungs remove irritants and mucus. It helps protect our lungs from getting pneumonia.  You can get a dry hacking cough after a chest cold. Sometimes this type of cough can last 1-3 weeks, and be worse at night.  Here is some care advice that should help.  Cough Medicines:  OTC Cough Syrups: The most common cough suppressant in OTC cough medications is dextromethorphan. Often the letters "DM" appear in the name.  OTC Cough Drops: Cough drops can help a lot, especially for mild coughs. They reduce coughing by soothing your irritated throat and removing  that tickle sensation in the back of the throat. Cough drops also have the advantage of portability - you can carry them with you.  OTC Cough Syrup - Dextromethorphan:  Cough syrups containing the cough suppressant dextromethorphan (DM) may help decrease your cough. Cough syrups work best for coughs that keep you awake at night. They can also sometimes help in the late stages of a respiratory infection when the cough is dry and hacking. They can be used along with cough drops.  Read the package instructions for dosage, contraindications, and other important information.  Coughing Spasms:  Drink warm fluids. Inhale warm mist (Reason: both relax the airway and loosen up the phlegm).  Prevent Dehydration:  Drink adequate liquids.  This will help soothe an irritated or dry throat and loosen up the phlegm.  Avoid Tobacco Smoke:  Smoking or being exposed to smoke makes coughs much worse.  Expected Course:   The expected course depends on what is causing the cough.  Viral bronchitis (chest cold) causes a cough that lasts 1 to 3 weeks. Sometimes you may cough up lots of phlegm (sputum, mucus). The mucus can normally be white, gray, yellow, or green.  Call Back If:  Difficulty breathing  Cough lasts more than 3 weeks  Fever lasts > 3 days  You become worse.  Patient Will Follow Care Advice:  YES

## 2012-10-31 ENCOUNTER — Encounter (HOSPITAL_COMMUNITY): Payer: Self-pay | Admitting: Emergency Medicine

## 2012-10-31 ENCOUNTER — Emergency Department (HOSPITAL_COMMUNITY)
Admission: EM | Admit: 2012-10-31 | Discharge: 2012-10-31 | Disposition: A | Discharge status: Home / Self Care | Payer: Medicare Other | Source: Home / Self Care | Attending: Emergency Medicine | Admitting: Emergency Medicine

## 2012-10-31 DIAGNOSIS — J209 Acute bronchitis, unspecified: Secondary | ICD-10-CM

## 2012-10-31 DIAGNOSIS — J019 Acute sinusitis, unspecified: Secondary | ICD-10-CM

## 2012-10-31 MED ORDER — HYDROCOD POLST-CHLORPHEN POLST 10-8 MG/5ML PO LQCR: 5.0000 mL | Freq: Two times a day (BID) | ORAL | Status: DC | PRN

## 2012-10-31 MED ORDER — CEFDINIR 300 MG PO CAPS: 300.0000 mg | ORAL_CAPSULE | Freq: Two times a day (BID) | ORAL | Status: DC

## 2012-10-31 MED ORDER — ALBUTEROL SULFATE HFA 108 (90 BASE) MCG/ACT IN AERS: 2.0000 | INHALATION_SPRAY | Freq: Four times a day (QID) | RESPIRATORY_TRACT | Status: DC

## 2012-10-31 NOTE — ED Notes (Addendum)
Call from pharmacy, cough medication prescribed  is not covered under his insurance. Per pharmacy, phenergan DM, is much less expensive (11.99)  Rx change authorized by Dr Jake Michaelis, 1 tsp q 4-6 H , dispense 4 oz

## 2012-10-31 NOTE — ED Notes (Signed)
Pt c/o continued cough, congestion; reports yellow green secretions, rib soreness; chest clear to ascultation

## 2012-10-31 NOTE — ED Provider Notes (Signed)
Chief Complaint:   Chief Complaint  Patient presents with  . Cough    History of Present Illness:   Gary Yates. is a 79 year old male who has had a history since October 6 of cough productive yellow-green sputum, nasal congestion with yellow-green drainage, headache, sinus pressure, and sore throat. He was here on October 14 and was prescribed Atrovent nasal spray. This helped his nasal congestion but didn't really help the cough. Right now his biggest problem is an ongoing cough. He denies any wheezing, chest tightness, shortness of breath, or chest pain. He's had no fever, chills, sweats, aches, weight loss. He denies any GI symptoms.  Review of Systems:  Other than noted above, the patient denies any of the following symptoms: Systemic:  No fevers, chills, sweats, weight loss or gain, or fatigue. ENT:  No nasal congestion, sneezing, itching, postnasal drip, sinus pressure, headache, sore throat, or hoarseness. Lungs:  No wheezing, shortness of breath, chest tightness or congestion. Heart:  No chest pain, tightness, pressure, PND, orthopnea, or ankle edema. GI:  No indigestion, heartburn, waterbrash, burping, abdominal pain, nausea, or vomiting.  [place]:  Past medical history, family history, social history, meds, and allergies were reviewed.  Specifically, there is no history of asthma, allergies, reflux esophagitis or cigarette smoking. He has a history of high blood pressure, diabetes, hypercholesterolemia, and coronary artery disease. He has 2 stents. He takes Elavil, aspirin, Lipitor, fenofibrate, Neurontin, Amaryl, Dilaudid, NovoLog, Lantus insulin, Atrovent nasal spray, lisinopril, metoprolol, nitroglycerin, Prilosec, Mirapex, Viagra, Depakote testosterone, Spiriva, and Coumadin.  Physical Exam:   Vital signs:  BP 154/92  Pulse 98  Temp(Src) 98.8 F (37.1 C) (Oral)  Resp 18  SpO2 96% General:  Alert and oriented.  In no distress.  Skin warm and dry. ENT: TMs and ear canals  normal.  Nasal mucosa normal, without drainage.  Pharynx clear without exudate or drainage.  No intraoral lesions. Neck:  No adenopathy, tenderness or mass.  No JVD. Lungs:  No respiratory distress.  Breath sounds clear and equal bilaterally.  No wheezes, rales or rhonchi. Heart:  Regular rhythm, no gallops or murmers.  No pedal edema. Abdomon:  Soft and nontender.  No organomegaly or mass.  Assessment:  The primary encounter diagnosis was Acute bronchitis. A diagnosis of Acute sinusitis was also pertinent to this visit.  Plan:   1.  Meds:  The following meds were prescribed:   Discharge Medication List as of 10/31/2012  1:56 PM    START taking these medications   Details  albuterol (PROVENTIL HFA;VENTOLIN HFA) 108 (90 BASE) MCG/ACT inhaler Inhale 2 puffs into the lungs 4 (four) times daily., Starting 10/31/2012, Until Discontinued, Normal    cefdinir (OMNICEF) 300 MG capsule Take 1 capsule (300 mg total) by mouth 2 (two) times daily., Starting 10/31/2012, Until Discontinued, Normal    chlorpheniramine-HYDROcodone (TUSSIONEX) 10-8 MG/5ML LQCR Take 5 mLs by mouth every 12 (twelve) hours as needed., Starting 10/31/2012, Until Discontinued, Normal        2.  Patient Education/Counseling:  The patient was given appropriate handouts, self care instructions, and instructed in symptomatic relief.   3.  Follow up:  The patient was told to follow up if no better in 3 to 4 days, if becoming worse in any way, and given some red flag symptoms such as fever shortness of breath which would prompt immediate return.  Follow up with Dr. Elease Hashimoto in one week.       Harden Mo, MD 10/31/12 2228

## 2012-11-09 ENCOUNTER — Ambulatory Visit (INDEPENDENT_AMBULATORY_CARE_PROVIDER_SITE_OTHER): Payer: Medicare Other | Admitting: General Practice

## 2012-11-09 DIAGNOSIS — I4891 Unspecified atrial fibrillation: Secondary | ICD-10-CM

## 2012-11-09 DIAGNOSIS — Z7901 Long term (current) use of anticoagulants: Secondary | ICD-10-CM

## 2012-11-09 DIAGNOSIS — I480 Paroxysmal atrial fibrillation: Secondary | ICD-10-CM

## 2012-11-09 LAB — POCT INR: INR: 2.9

## 2012-11-11 ENCOUNTER — Ambulatory Visit (HOSPITAL_COMMUNITY): Payer: Medicare Other | Attending: Cardiology

## 2012-11-11 DIAGNOSIS — E119 Type 2 diabetes mellitus without complications: Secondary | ICD-10-CM | POA: Insufficient documentation

## 2012-11-11 DIAGNOSIS — I6529 Occlusion and stenosis of unspecified carotid artery: Secondary | ICD-10-CM | POA: Insufficient documentation

## 2012-11-11 DIAGNOSIS — J4489 Other specified chronic obstructive pulmonary disease: Secondary | ICD-10-CM | POA: Insufficient documentation

## 2012-11-11 DIAGNOSIS — E785 Hyperlipidemia, unspecified: Secondary | ICD-10-CM | POA: Insufficient documentation

## 2012-11-11 DIAGNOSIS — I10 Essential (primary) hypertension: Secondary | ICD-10-CM | POA: Insufficient documentation

## 2012-11-11 DIAGNOSIS — I658 Occlusion and stenosis of other precerebral arteries: Secondary | ICD-10-CM | POA: Insufficient documentation

## 2012-11-11 DIAGNOSIS — J449 Chronic obstructive pulmonary disease, unspecified: Secondary | ICD-10-CM | POA: Insufficient documentation

## 2012-11-11 DIAGNOSIS — Z87891 Personal history of nicotine dependence: Secondary | ICD-10-CM | POA: Insufficient documentation

## 2012-11-13 ENCOUNTER — Other Ambulatory Visit (HOSPITAL_COMMUNITY): Payer: Self-pay | Admitting: Family Medicine

## 2012-11-13 NOTE — Telephone Encounter (Signed)
Patient got this medication on 10/31/12. Is it okay to refill

## 2012-11-15 NOTE — Telephone Encounter (Signed)
Needs to be seen if still having infectious symptoms.

## 2012-12-07 ENCOUNTER — Ambulatory Visit: Payer: Medicare Other

## 2012-12-07 ENCOUNTER — Telehealth: Payer: Self-pay | Admitting: Family Medicine

## 2012-12-07 NOTE — Telephone Encounter (Signed)
That's fine to use

## 2012-12-07 NOTE — Telephone Encounter (Signed)
done

## 2012-12-07 NOTE — Telephone Encounter (Signed)
Pt requesting earlier appt than the first available next mon. Pt states the neuropathy in legs is worse. The med he rx for restless leg is only working to  Certain point. Pt is having to take more (up to 3/nite) just to get to sleep.  (parmipexole) There is a same day on wed. pls advise

## 2012-12-09 ENCOUNTER — Ambulatory Visit (INDEPENDENT_AMBULATORY_CARE_PROVIDER_SITE_OTHER): Payer: Medicare Other | Admitting: Family Medicine

## 2012-12-09 ENCOUNTER — Encounter: Payer: Self-pay | Admitting: Family Medicine

## 2012-12-09 ENCOUNTER — Ambulatory Visit (INDEPENDENT_AMBULATORY_CARE_PROVIDER_SITE_OTHER): Payer: Medicare Other | Admitting: Family

## 2012-12-09 VITALS — BP 140/74 | HR 98 | Temp 98.1°F | Wt 280.0 lb

## 2012-12-09 DIAGNOSIS — I480 Paroxysmal atrial fibrillation: Secondary | ICD-10-CM

## 2012-12-09 DIAGNOSIS — Z7901 Long term (current) use of anticoagulants: Secondary | ICD-10-CM

## 2012-12-09 DIAGNOSIS — E291 Testicular hypofunction: Secondary | ICD-10-CM

## 2012-12-09 DIAGNOSIS — E349 Endocrine disorder, unspecified: Secondary | ICD-10-CM

## 2012-12-09 DIAGNOSIS — I4891 Unspecified atrial fibrillation: Secondary | ICD-10-CM

## 2012-12-09 DIAGNOSIS — G2581 Restless legs syndrome: Secondary | ICD-10-CM

## 2012-12-09 LAB — POCT INR: INR: 2.2

## 2012-12-09 MED ORDER — TESTOSTERONE CYPIONATE 200 MG/ML IM SOLN
200.0000 mg | Freq: Once | INTRAMUSCULAR | Status: AC
  Administered 2012-12-09: 200 mg via INTRAMUSCULAR

## 2012-12-09 MED ORDER — PRAMIPEXOLE DIHYDROCHLORIDE 0.125 MG PO TABS: ORAL_TABLET | ORAL | Status: DC

## 2012-12-09 NOTE — Progress Notes (Signed)
   Subjective:    Patient ID: Gary Yates., male    DOB: 1933-03-22, 79 y.o.   MRN: [id number]  HPI Here to discuss restless leg syndrome. He has history of peripheral neuropathy but also restless leg syndrome. He takes gabapentin 300 mg 2 in the morning 2 at lunch and 3 at night. We recently added Mirapex currently taking 0.25 mg once at night. This is helping but she's had some restless leg symptoms even with this. Last night on his own he increased to 3 tablets and rested much better. He naturally had some increased sedation but no other side effects.  He is not describing any burning or significant pain is much as restlessness of his legs. He does not use caffeine. No alcohol use.  Type 2 diabetes with history of poor control and poor compliance. He is using short acting insulin and has not seen much improvement in blood sugars thus far. We have scheduled followup in January to reassess  Past Medical History  Diagnosis Date  . Coronary artery disease     a. h/o Overlapping stents RCA;  b. 06/2011 Cath: patent stents, nonobs dzs, NL EF.  Marland Kitchen Hypertension   . Osteoarthritis     shoulder  . Restless leg   . Hyperlipidemia   . COPD (chronic obstructive pulmonary disease)   . Nephrolithiasis   . SVT (supraventricular tachycardia)   . Dyspnea   . GERD (gastroesophageal reflux disease)   . DM (diabetes mellitus)     Type 2, peripheral neuropathy.  . Diabetic peripheral neuropathy   . Atrial fibrillation   . Low testosterone    Past Surgical History  Procedure Laterality Date  . Cholecystectomy      reports that he quit smoking about 29 years ago. His smoking use included Cigarettes. He has a 30 pack-year smoking history. He has never used smokeless tobacco. He reports that he does not drink alcohol or use illicit drugs. family history includes Alzheimer's disease in his mother; Arthritis in his brother and sister; Coronary artery disease in some other family members; Heart disease in  his brother, father, mother, and sister; Migraines in his daughter and father; Obesity in his sister and son; Sleep apnea in his son; Thyroid disease in his daughter; Ulcers in his father. Allergies  Allergen Reactions  . Ace Inhibitors Other (See Comments)    cough  . Codeine Nausea Only and Rash       . Penicillins Rash      Review of Systems  Constitutional: Positive for fatigue.  Eyes: Negative for visual disturbance.  Respiratory: Negative for cough, chest tightness and shortness of breath.   Cardiovascular: Negative for chest pain, palpitations and leg swelling.  Neurological: Negative for dizziness, syncope, weakness, light-headedness and headaches.       Objective:   Physical Exam  Constitutional: He appears well-developed and well-nourished.  Cardiovascular: Normal rate and regular rhythm.   Pulmonary/Chest: Effort normal and breath sounds normal. No respiratory distress. He has no wheezes. He has no rales.  Musculoskeletal: He exhibits no edema.  Neurological: He is alert.          Assessment & Plan:  Restless leg syndrome. Improved but still has significant symptoms with Mirapex 0.25 mg daily. We recommend titrating this to 3 tablets at night. Continue gabapentin. He has scheduled followup in January to reassess.  Continue to avoid caffeine and alcohol

## 2012-12-09 NOTE — Patient Instructions (Signed)
Restless Legs Syndrome Restless legs syndrome is a movement disorder. It may also be called a sensori-motor disorder.  CAUSES  No one knows what specifically causes restless legs syndrome, but it tends to run in families. It is also more common in people with low iron, in pregnancy, in people who need dialysis, and those with nerve damage (neuropathy).Some medications may make restless legs syndrome worse.Those medications include drugs to treat high blood pressure, some heart conditions, nausea, colds, allergies, and depression. SYMPTOMS Symptoms include uncomfortable sensations in the legs. These leg sensations are worse during periods of inactivity or rest. They are also worse while sitting or lying down. Individuals that have the disorder describe sensations in the legs that feel like:  Pulling.  Drawing.  Crawling.  Worming.  Boring.  Tingling.  Pins and needles.  Prickling.  Pain. The sensations are usually accompanied by an overwhelming urge to move the legs. Sudden muscle jerks may also occur. Movement provides temporary relief from the discomfort. In rare cases, the arms may also be affected. Symptoms may interfere with going to sleep (sleep onset insomnia). Restless legs syndrome may also be related to periodic limb movement disorder (PLMD). PLMD is another more common motor disorder. It also causes interrupted sleep. The symptoms from PLMD usually occur most often when you are awake. TREATMENT  Treatment for restless legs syndrome is symptomatic. This means that the symptoms are treated.   Massage and cold compresses may provide temporary relief.  Walk, stretch, or take a cold or hot bath.  Get regular exercise and a good night's sleep.  Avoid caffeine, alcohol, nicotine, and medications that can make it worse.  Do activities that provide mental stimulation like discussions, needlework, and video games. These may be helpful if you are not able to walk or  stretch. Some medications are effective in relieving the symptoms. However, many of these medications have side effects. Ask your caregiver about medications that may help your symptoms. Correcting iron deficiency may improve symptoms for some patients. Document Released: 12/14/2001 Document Revised: 03/18/2011 Document Reviewed: 03/22/2010 Exit[employer] Patient Information 2014 [employer].  

## 2012-12-09 NOTE — Progress Notes (Signed)
Pre visit review using our clinic review tool, if applicable. No additional management support is needed unless otherwise documented below in the visit note. 

## 2012-12-09 NOTE — Patient Instructions (Signed)
Continue to take 1 tablet daily except 1 1/2 tablets on Wednesday. Re-check in 4 weeks.  Anticoagulation Dose Instructions as of 12/09/2012     Dorene Grebe Tue Wed Thu Fri Sat   New Dose 5 mg 5 mg 5 mg 7.5 mg 5 mg 5 mg 5 mg    Description       Continue to take 1 tablet daily except 1 1/2 tablets on Wednesday. Re-check in 4 weeks.

## 2012-12-14 ENCOUNTER — Ambulatory Visit: Payer: Medicare Other | Admitting: Family Medicine

## 2012-12-15 ENCOUNTER — Other Ambulatory Visit: Payer: Self-pay | Admitting: General Practice

## 2012-12-15 ENCOUNTER — Other Ambulatory Visit: Payer: Self-pay | Admitting: Family Medicine

## 2012-12-23 ENCOUNTER — Ambulatory Visit (INDEPENDENT_AMBULATORY_CARE_PROVIDER_SITE_OTHER): Payer: Medicare Other | Admitting: Cardiovascular Disease

## 2012-12-23 ENCOUNTER — Encounter: Payer: Self-pay | Admitting: Cardiovascular Disease

## 2012-12-23 VITALS — BP 146/68 | HR 82 | Ht 70.75 in | Wt 281.0 lb

## 2012-12-23 DIAGNOSIS — I2510 Atherosclerotic heart disease of native coronary artery without angina pectoris: Secondary | ICD-10-CM

## 2012-12-23 DIAGNOSIS — I4891 Unspecified atrial fibrillation: Secondary | ICD-10-CM

## 2012-12-23 DIAGNOSIS — G4730 Sleep apnea, unspecified: Secondary | ICD-10-CM

## 2012-12-23 DIAGNOSIS — Z7901 Long term (current) use of anticoagulants: Secondary | ICD-10-CM

## 2012-12-23 NOTE — Patient Instructions (Signed)
Your physician recommends that you schedule a follow-up appointment in:  6 weeks.  Your physician has requested that you have a lexiscan myoview. For further information please visit [email or web address]. Please follow instruction sheet, as given.   You have been referred to Dr. Gwenette Greet in [place] pulmonary

## 2012-12-23 NOTE — Progress Notes (Signed)
History of Present Illness: 79 yo WM with history of HTN, hyperlipidemia, morbid obesity, DM, CAD, atrial fibrillation and obstructive lung disease here for hospital follow up. He was admitted to [facility] 06/18/11 with chest pain. Cardiac cath on 06/18/11 with moderate LAD disease with patent RCA stents. No flow limiting lesions. Normal LVEF. D Dimer and CE were normal. He was also noted to have tachycardia which was felt to be atrial fibrillation. Dr. Caryl Comes saw him in the hospital and started Xarelto for anticoagulation and increased metoprolol for rate control. Slurred speech and seen by Neuro. Head CT without evidence of CVA. Head MRI with questionable tiny acute infarct posterior left frontal lobe, mild small vessel disease type changes, global atrophy without hydrocephalus. Carotid artery dopplers 11/11/12 with mild (0-39%) bilateral ICA stenosis. He decided not to start Xarelto due to cost. He was started on coumadin and has been followed in the coumadin clinic.   He is here today for follow up. Earlier this week, he was blowing leaves and had pressure in his chest. This was severe. He sat down and rested and used one NTG. The next day he was very tired. He has been very fatigued. He has not noticed any palpitations. He has had no bleeding with coumadin.   Primary Care Physician: Carolann Littler  Last Lipid Profile:Lipid Panel     Component Value Date/Time   CHOL 258* 10/19/2012 0818   TRIG 353.0* 10/19/2012 0818   HDL 38.50* 10/19/2012 0818   CHOLHDL 7 10/19/2012 0818   VLDL 70.6* 10/19/2012 0818   LDLCALC 81 06/20/2011 0552     Past Medical History  Diagnosis Date  . Coronary artery disease     a. h/o Overlapping stents RCA;  b. 06/2011 Cath: patent stents, nonobs dzs, NL EF.  Marland Kitchen Hypertension   . Osteoarthritis     shoulder  . Restless leg   . Hyperlipidemia   . COPD (chronic obstructive pulmonary disease)   . Nephrolithiasis   . SVT (supraventricular tachycardia)     . Dyspnea   . GERD (gastroesophageal reflux disease)   . DM (diabetes mellitus)     Type 2, peripheral neuropathy.  . Diabetic peripheral neuropathy   . Atrial fibrillation   . Low testosterone     Past Surgical History  Procedure Laterality Date  . Cholecystectomy      Current Outpatient Prescriptions  Medication Sig Dispense Refill  . ACCU-CHEK AVIVA PLUS test strip TEST 3 TIMES A DAY  100 strip  11  . albuterol (PROVENTIL HFA;VENTOLIN HFA) 108 (90 BASE) MCG/ACT inhaler Inhale 2 puffs into the lungs 4 (four) times daily.  1 Inhaler  0  . aspirin 81 MG tablet Take 81 mg by mouth daily.        Marland Kitchen atorvastatin (LIPITOR) 40 MG tablet Take 1 tablet (40 mg total) by mouth daily.  90 tablet  3  . Calcium Carbonate-Vitamin D (CALCIUM + D PO) Take by mouth daily.        . fenofibrate micronized (LOFIBRA) 134 MG capsule Take 1 capsule by mouth  daily before breakfast  90 capsule  3  . FLUZONE HIGH-DOSE injection       . gabapentin (NEURONTIN) 300 MG capsule       . glimepiride (AMARYL) 4 MG tablet Take 1 tablet by mouth  daily before breakfast  90 tablet  3  . glucose blood (ACCU-CHEK AVIVA PLUS) test strip Test BS 3 times daily  100 each  6  . insulin aspart (NOVOLOG FLEXPEN) 100 UNIT/ML SOPN FlexPen Inject 5 Units into the skin 2 (two) times daily with a meal.      . Insulin Glargine (LANTUS SOLOSTAR) 100 UNIT/ML SOPN INJECT 40 UNITS INTO THE SKIN 2 (TWO) TIMES DAILY.  30 pen  1  . insulin glargine (LANTUS) 100 UNIT/ML injection Inject 0.44 mLs (44 Units total) into the skin 2 (two) times daily.  15 pen  3  . ipratropium (ATROVENT) 0.03 % nasal spray Place 2 sprays into the nose every 12 (twelve) hours.  30 mL  1  . Lancets (ACCU-CHEK MULTICLIX) lancets Use twice daily as instructed  204 each  3  . metoprolol succinate (TOPROL-XL) 100 MG 24 hr tablet Take 1 tablet by mouth  every day immediately  following a meal  90 tablet  3  . nitroGLYCERIN (NITROSTAT) 0.4 MG SL tablet Place 0.4 mg  under the tongue every 5 (five) minutes as needed. Chest pain      . omeprazole (PRILOSEC) 20 MG capsule Take 1 capsule by mouth  daily  90 capsule  3  . pramipexole (MIRAPEX) 0.125 MG tablet Take 3 tablets at night.  90 tablet  6  . sildenafil (VIAGRA) 50 MG tablet Take 1 tablet (50 mg total) by mouth daily as needed for erectile dysfunction.  6 tablet  3  . testosterone cypionate (DEPO-TESTOSTERONE) 200 MG/ML injection Inject 1 mL (200 mg total) into the muscle every 28 (twenty-eight) days.  10 mL  0  . tiotropium (SPIRIVA) 18 MCG inhalation capsule Place 18 mcg into inhaler and inhale daily.      Marland Kitchen warfarin (COUMADIN) 5 MG tablet Take as directed by anticoagulation clinic  35 tablet  2  . [DISCONTINUED] metFORMIN (GLUCOPHAGE) 500 MG tablet Take 1 tablet (500 mg total) by mouth 2 (two) times daily with a meal.  60 tablet  1  . [DISCONTINUED] metoprolol (LOPRESSOR) 50 MG tablet       . [DISCONTINUED] spironolactone (ALDACTONE) 25 MG tablet        No current facility-administered medications for this visit.   Facility-Administered Medications Ordered in Other Visits  Medication Dose Route Frequency Provider Last Rate Last Dose  . testosterone cypionate (DEPOTESTOTERONE CYPIONATE) injection 200 mg  200 mg Intramuscular Q28 days [name], MD   200 mg at 12/26/11 0850  . testosterone cypionate (DEPOTESTOTERONE CYPIONATE) injection 200 mg  200 mg Intramuscular Q28 days [name], MD   200 mg at 01/23/12 0908  . testosterone cypionate (DEPOTESTOTERONE CYPIONATE) injection 200 mg  200 mg Intramuscular Q28 days [name], MD   200 mg at 02/25/12 0848  . testosterone cypionate (DEPOTESTOTERONE CYPIONATE) injection 200 mg  200 mg Intramuscular Q28 days [name], MD   200 mg at 06/03/12 1437    Allergies  Allergen Reactions  . Ace Inhibitors Other (See Comments)    cough  . Codeine Nausea Only and Rash       . Penicillins Rash    History   Social History  .  Marital Status: Married    Spouse Name: N/A    Number of Children: 3  . Years of Education: N/A   Occupational History  . Retired    Social History Main Topics  . Smoking status: Former Smoker -- 1.50 packs/day for 20 years    Types: Cigarettes    Quit date: 04/05/1983  . Smokeless tobacco: Never Used  . Alcohol Use: No  .  Drug Use: No  . Sexual Activity: Not Currently   Other Topics Concern  . Not on file   Social History Narrative  . No narrative on file    Family History  Problem Relation Age of Onset  . Coronary artery disease      Male 1st degree relative <50  . Coronary artery disease      male 1st degree relative <60  . Heart disease Father   . Migraines Father   . Ulcers Father   . Alzheimer's disease Mother   . Heart disease Mother   . Heart disease Sister   . Obesity Sister     Morbid  . Arthritis Sister   . Heart disease Brother   . Arthritis Brother   . Sleep apnea Son   . Obesity Son   . Migraines Daughter   . Thyroid disease Daughter     Review of Systems:  As stated in the HPI and otherwise negative.   BP 146/68  Pulse 82  Ht 5' 10.75" (1.797 m)  Wt 281 lb (127.461 kg)  BMI 39.47 kg/m2  Physical Examination: General: Well developed, well nourished, NAD HEENT: OP clear, mucus membranes moist SKIN: warm, dry. No rashes. Neuro: No focal deficits Musculoskeletal: Muscle strength 5/5 all ext Psychiatric: Mood and affect normal Neck: No JVD, no carotid bruits, no thyromegaly, no lymphadenopathy. Lungs:Clear bilaterally, no wheezes, rhonci, crackles Cardiovascular: Regular rate and rhythm. No murmurs, gallops or rubs. Abdomen:Soft. Bowel sounds present. Non-tender.  Extremities: No lower extremity edema. Pulses are 2 + in the bilateral DP/PT.  Assessment and Plan:   1. CORONARY ARTERY DISEASE: Recent chest pains and fatigue. He is known to have moderate LAD stenosis and patent stents RCA by cath June 2013. Will continue medical therapy.  Will arrange Lexiscan stress myoview to exclude ischemia.   2. Paroxysmal atrial fibrillation: He is maintaining NSR. He did not want to start Xarelto due to cost. He has been maintained on coumadin therapy. Will continue Toprol at current dose.   3. Sleep apnea: He snores loudly at night, has excessive daytime fatigue with somnolence but trouble sleeping at night. Will refer to see Dr. Gwenette Greet to discuss potential sleep study. He had one 20 years ago but did not like the protocol and remembers it as a bad experience.

## 2013-01-07 HISTORY — PX: CARDIAC CATHETERIZATION: SHX172

## 2013-01-11 ENCOUNTER — Ambulatory Visit: Payer: Medicare Other

## 2013-01-11 ENCOUNTER — Telehealth: Payer: Self-pay | Admitting: Family Medicine

## 2013-01-11 NOTE — Telephone Encounter (Signed)
Patient Information:  Caller Name: Gary Yates  Phone: [phone]  Patient: Gary Yates, Gary Yates  Gender: Male  DOB: 06-16-1933  Age: 80 Years  PCP: Carolann Littler [facility])  Office Follow Up:  Does the office need to follow up with this patient?: Yes  Instructions For The Office: Pt uses [facility]   Symptoms  Reason For Call & Symptoms: Pt has been taking Tramipexole 0.125mg  (3 tablets) at night at Gabapentin for his restless leg syndrome and neuropathy from his visit 2 weeks ago.Marland Kitchen He tries to go to sleep but his legs are very painful. They don't hurt in the daytime - if he naps during the day they don't hurt they just feel heavy. Only when he goes to bed and tries to relax. He has had this  x the past several months. He is very frustrated . He takes the pills at 9:30. Goes to bed at 11:30 and then he wakes up in 1 hour and is up most of the night walking and rubbing his legs. He is only sleeping 4:30 - 7:30. He says the pain is unbearable.  By  the daytime they are fine. Pt wants to ask if MD if there is anything else he can prescribe.  Reviewed Health History In EMR: Yes  Reviewed Medications In EMR: Yes  Reviewed Allergies In EMR: Yes  Reviewed Surgeries / Procedures: Yes  Date of Onset of Symptoms: 12/09/2012  Guideline(s) Used:  Leg Pain  Disposition Per Guideline:   See Within 3 Days in Office  Reason For Disposition Reached:   Moderate pain (e.g., interferes with normal activities, limping) and present > 3 days  Advice Given:  N/A  Patient Refused Recommendation:  Patient Requests Prescription  Is asking if MD will call in something different

## 2013-01-11 NOTE — Telephone Encounter (Signed)
Make sure he is taking his gabapentin regularly, as prescribed. Add back low dose Elavil 10 mg po qhs.  Give [place] some feedback in one week.

## 2013-01-12 ENCOUNTER — Telehealth: Payer: Self-pay | Admitting: Family Medicine

## 2013-01-12 ENCOUNTER — Encounter: Payer: Self-pay | Admitting: Cardiology

## 2013-01-12 MED ORDER — AMITRIPTYLINE HCL 10 MG PO TABS: 10.0000 mg | ORAL_TABLET | Freq: Every day | ORAL | Status: DC

## 2013-01-12 NOTE — Telephone Encounter (Signed)
Patient Information:  Caller Name: [name]  Phone: [phone]  Patient: Gary Yates, Gary Yates  Gender: Male  DOB: 13-Jul-1933  Age: 80 Years  PCP: Carolann Littler [facility])  Office Follow Up:  Does the office need to follow up with this patient?: Yes  Instructions For The Office: He has appointment on 01/14/12 @ 09:30 for  Nuclear Treadmill scan.  Please follow up with patient regarding scheduling office appointment.  He states he is not able to wait to see PCP until next scheduled appointment on 01/26/13.  He received call from office at the conclusion of the call and was instructed that Elavil will be added and to continue Gabapentin.   Symptoms  Reason For Call & Symptoms: Patient reports he has symptoms at night - difficulty sleeping due to pain in lower legs and feet.  Not relieved with Rx medications. Reports he sometimes walks the floor until 03:30 or 04:00.  He states he has considered going to Diabetes specialist. He also reports FSBG in 150-180 range consistently.   Pain rated at 10 on 0-10 scale.  He  reports some gait changes estimated onset November 2014.  He relates he takes cat naps during the day and the pain causes him not to be able to walk as much as usual.  Emergent symptoms ruled out.  See Within 3 Days in Office per Leg Pain guideline due to Moderate pain and present > 3 days.  Reviewed Health History In EMR: Yes  Reviewed Medications In EMR: Yes  Reviewed Allergies In EMR: Yes  Reviewed Surgeries / Procedures: Yes  Date of Onset of Symptoms: Unknown  Treatments Tried: Rx meds for pain, restless legs  Treatments Tried Worked: No  Guideline(s) Used:  Leg Pain  Disposition Per Guideline:   See Today or Tomorrow in Office  Reason For Disposition Reached:   Patient wants to be seen  Advice Given:  Call Back If:  Signs of infection occur (e.g., spreading redness, warmth, fever)  You become worse.  Patient Will Follow Care Advice:  YES

## 2013-01-12 NOTE — Telephone Encounter (Signed)
Can you please call the patient to schedule him for a sooner appt with dr. Elease Hashimoto. Thank you

## 2013-01-12 NOTE — Telephone Encounter (Signed)
RX sent to pharmacy pt aware. Pt stated that he is taking the gabapentin regularly.

## 2013-01-12 NOTE — Telephone Encounter (Signed)
Pt is going to try med first and come in next week.

## 2013-01-13 ENCOUNTER — Ambulatory Visit: Payer: Medicare Other | Admitting: Family

## 2013-01-13 ENCOUNTER — Ambulatory Visit (HOSPITAL_COMMUNITY): Payer: Medicare Other | Attending: Cardiovascular Disease | Admitting: Radiology

## 2013-01-13 ENCOUNTER — Encounter: Payer: Self-pay | Admitting: Cardiovascular Disease

## 2013-01-13 VITALS — BP 161/67 | HR 79 | Ht 71.0 in | Wt 278.0 lb

## 2013-01-13 DIAGNOSIS — I779 Disorder of arteries and arterioles, unspecified: Secondary | ICD-10-CM | POA: Insufficient documentation

## 2013-01-13 DIAGNOSIS — R079 Chest pain, unspecified: Secondary | ICD-10-CM

## 2013-01-13 DIAGNOSIS — I10 Essential (primary) hypertension: Secondary | ICD-10-CM | POA: Insufficient documentation

## 2013-01-13 DIAGNOSIS — R0989 Other specified symptoms and signs involving the circulatory and respiratory systems: Secondary | ICD-10-CM | POA: Insufficient documentation

## 2013-01-13 DIAGNOSIS — I2510 Atherosclerotic heart disease of native coronary artery without angina pectoris: Secondary | ICD-10-CM | POA: Insufficient documentation

## 2013-01-13 DIAGNOSIS — R5381 Other malaise: Secondary | ICD-10-CM | POA: Insufficient documentation

## 2013-01-13 DIAGNOSIS — Z9861 Coronary angioplasty status: Secondary | ICD-10-CM | POA: Insufficient documentation

## 2013-01-13 DIAGNOSIS — R42 Dizziness and giddiness: Secondary | ICD-10-CM | POA: Insufficient documentation

## 2013-01-13 DIAGNOSIS — R5383 Other fatigue: Secondary | ICD-10-CM

## 2013-01-13 DIAGNOSIS — R0609 Other forms of dyspnea: Secondary | ICD-10-CM | POA: Insufficient documentation

## 2013-01-13 DIAGNOSIS — E119 Type 2 diabetes mellitus without complications: Secondary | ICD-10-CM | POA: Insufficient documentation

## 2013-01-13 MED ORDER — TECHNETIUM TC 99M SESTAMIBI GENERIC - CARDIOLITE
33.0000 | Freq: Once | INTRAVENOUS | Status: AC | PRN
  Administered 2013-01-13: 33 via INTRAVENOUS

## 2013-01-13 MED ORDER — REGADENOSON 0.4 MG/5ML IV SOLN
0.4000 mg | Freq: Once | INTRAVENOUS | Status: AC
  Administered 2013-01-13: 0.4 mg via INTRAVENOUS

## 2013-01-13 MED ORDER — TECHNETIUM TC 99M SESTAMIBI GENERIC - CARDIOLITE
10.8000 | Freq: Once | INTRAVENOUS | Status: AC | PRN
  Administered 2013-01-13: 11 via INTRAVENOUS

## 2013-01-13 NOTE — Progress Notes (Signed)
[facility] [address] [place], [place] [address] [phone]    Cardiology Nuclear Med Study  Gary Yates. is a 80 y.o. male     MRN : [id number]     DOB: 07-07-1933  Procedure Date: 01/13/2013  Nuclear Med Background Indication for Stress Test:  Evaluation for Ischemia History:  CAD, Cath 2013 n/o dz., Stent RCA, PAF, Echo 2013 45-50%, COPD Cardiac Risk Factors: Carotid Disease, Family History - CAD, History of Smoking, Hypertension, IDDM Type 2 and Lipids  Symptoms:  Chest Pain with Exertion (last date of chest discomfort was one month ago), Dizziness, DOE and Fatigue   Nuclear Pre-Procedure Caffeine/Decaff Intake:  None NPO After: 9:00pm   Lungs:  clear O2 Sat: 94% on room air. IV 0.9% NS with Angio Cath:  20g  IV Site: R Antecubital  IV Started by:  Perrin Maltese, EMT-P  Chest Size (in):  50 Cup Size: n/a  Height: 5\' 11"  (1.803 m)  Weight:  278 lb (126.1 kg)  BMI:  Body mass index is 38.79 kg/(m^2). Tech Comments:  N/A    Nuclear Med Study 1 or 2 day study: 1 day  Stress Test Type:  Lexiscan  Reading MD: N/A  Order Authorizing Provider:  Lauree Chandler, MD  Resting Radionuclide: Technetium 12m Sestamibi  Resting Radionuclide Dose: 11.0 mCi   Stress Radionuclide:  Technetium 64m Sestamibi  Stress Radionuclide Dose: 33.0 mCi           Stress Protocol Rest HR: 79 Stress HR: 90  Rest BP: 161/67 Stress BP: 166/82  Exercise Time (min): n/a METS: n/a           Dose of Adenosine (mg):  n/a Dose of Lexiscan: 0.4 mg  Dose of Atropine (mg): n/a Dose of Dobutamine: n/a mcg/kg/min (at max HR)  Stress Test Technologist: Glade Lloyd, BS-ES  Nuclear Technologist:  Vedia Pereyra, CNMT     Rest Procedure:  Myocardial perfusion imaging was performed at rest 45 minutes following the intravenous administration of Technetium 25m Sestamibi. Rest ECG: NSR RBBB LAFB  Stress Procedure:  The patient received IV Lexiscan 0.4 mg over  15-seconds.  Technetium 40m Sestamibi injected at 30-seconds.  Quantitative spect images were obtained after a 45 minute delay.  During the infusion of Lexiscan, the patient complained of a "strange" feeling and a headache.  These symptoms resolved in recovery.  Stress ECG: No significant change from baseline ECG  QPS Raw Data Images:  Normal; no motion artifact; normal heart/lung ratio. Stress Images:  There is decreased uptake in the inferior wall. Rest Images:  There is decreased uptake in the inferior wall. Subtraction (SDS):  mixed infarct and ischemia Transient Ischemic Dilatation (Normal <1.22):  0.98 Lung/Heart Ratio (Normal <0.45):  0.40  Quantitative Gated Spect Images QGS EDV:  126 ml QGS ESV:  54 ml  Impression Exercise Capacity:  Lexiscan with no exercise. BP Response:  Normal blood pressure response. Clinical Symptoms:  Headache ECG Impression:  No significant ST segment change suggestive of ischemia. Comparison with Prior Nuclear Study: No images to compare  Overall Impression:  Low risk stress nuclear study Small inferior wall infarct from apex to base with mild peri infarct ischemia.  LV Ejection Fraction: 57%.  LV Wall Motion:  NL LV Function; NL Wall Motion   Gary Yates

## 2013-01-20 ENCOUNTER — Ambulatory Visit: Payer: Medicare Other | Admitting: Family Medicine

## 2013-01-20 ENCOUNTER — Ambulatory Visit: Payer: Medicare Other | Admitting: Family

## 2013-01-21 ENCOUNTER — Ambulatory Visit (INDEPENDENT_AMBULATORY_CARE_PROVIDER_SITE_OTHER): Payer: Medicare Other | Admitting: Family Medicine

## 2013-01-21 ENCOUNTER — Other Ambulatory Visit: Payer: Self-pay

## 2013-01-21 ENCOUNTER — Ambulatory Visit (INDEPENDENT_AMBULATORY_CARE_PROVIDER_SITE_OTHER): Payer: Medicare Other | Admitting: General Practice

## 2013-01-21 ENCOUNTER — Ambulatory Visit (INDEPENDENT_AMBULATORY_CARE_PROVIDER_SITE_OTHER): Payer: Medicare Other | Admitting: Cardiovascular Disease

## 2013-01-21 ENCOUNTER — Encounter: Payer: Self-pay | Admitting: *Deleted

## 2013-01-21 ENCOUNTER — Encounter: Payer: Self-pay | Admitting: Cardiovascular Disease

## 2013-01-21 ENCOUNTER — Encounter: Payer: Self-pay | Admitting: Family Medicine

## 2013-01-21 VITALS — BP 140/80 | HR 91 | Temp 97.6°F | Wt 286.0 lb

## 2013-01-21 VITALS — BP 142/98 | HR 88 | Ht 71.0 in | Wt 281.0 lb

## 2013-01-21 DIAGNOSIS — E1149 Type 2 diabetes mellitus with other diabetic neurological complication: Secondary | ICD-10-CM

## 2013-01-21 DIAGNOSIS — Z7901 Long term (current) use of anticoagulants: Secondary | ICD-10-CM

## 2013-01-21 DIAGNOSIS — E291 Testicular hypofunction: Secondary | ICD-10-CM

## 2013-01-21 DIAGNOSIS — R079 Chest pain, unspecified: Secondary | ICD-10-CM

## 2013-01-21 DIAGNOSIS — E1142 Type 2 diabetes mellitus with diabetic polyneuropathy: Secondary | ICD-10-CM

## 2013-01-21 DIAGNOSIS — R0600 Dyspnea, unspecified: Secondary | ICD-10-CM

## 2013-01-21 DIAGNOSIS — E349 Endocrine disorder, unspecified: Secondary | ICD-10-CM

## 2013-01-21 DIAGNOSIS — E1140 Type 2 diabetes mellitus with diabetic neuropathy, unspecified: Secondary | ICD-10-CM

## 2013-01-21 DIAGNOSIS — I2510 Atherosclerotic heart disease of native coronary artery without angina pectoris: Secondary | ICD-10-CM

## 2013-01-21 DIAGNOSIS — R0989 Other specified symptoms and signs involving the circulatory and respiratory systems: Secondary | ICD-10-CM

## 2013-01-21 DIAGNOSIS — I480 Paroxysmal atrial fibrillation: Secondary | ICD-10-CM

## 2013-01-21 DIAGNOSIS — I4891 Unspecified atrial fibrillation: Secondary | ICD-10-CM

## 2013-01-21 DIAGNOSIS — G2581 Restless legs syndrome: Secondary | ICD-10-CM

## 2013-01-21 DIAGNOSIS — G609 Hereditary and idiopathic neuropathy, unspecified: Secondary | ICD-10-CM

## 2013-01-21 DIAGNOSIS — G629 Polyneuropathy, unspecified: Secondary | ICD-10-CM

## 2013-01-21 DIAGNOSIS — R9439 Abnormal result of other cardiovascular function study: Secondary | ICD-10-CM

## 2013-01-21 DIAGNOSIS — E1165 Type 2 diabetes mellitus with hyperglycemia: Secondary | ICD-10-CM

## 2013-01-21 DIAGNOSIS — R0609 Other forms of dyspnea: Secondary | ICD-10-CM

## 2013-01-21 LAB — POCT INR: INR: 2

## 2013-01-21 LAB — HEMOGLOBIN A1C: Hgb A1c MFr Bld: 11.5 % — ABNORMAL HIGH (ref 4.6–6.5)

## 2013-01-21 MED ORDER — TESTOSTERONE CYPIONATE 200 MG/ML IM SOLN
200.0000 mg | Freq: Once | INTRAMUSCULAR | Status: AC
  Administered 2013-01-21: 200 mg via INTRAMUSCULAR

## 2013-01-21 MED ORDER — TESTOSTERONE CYPIONATE 200 MG/ML IM SOLN
200.0000 mg | INTRAMUSCULAR | Status: DC
Start: 2013-01-21 — End: 2013-05-21

## 2013-01-21 NOTE — Progress Notes (Signed)
Pre-visit discussion using our clinic review tool. No additional management support is needed unless otherwise documented below in the visit note.  

## 2013-01-21 NOTE — Progress Notes (Signed)
History of Present Illness: 80 yo WM with history of HTN, hyperlipidemia, morbid obesity, DM, CAD, atrial fibrillation and obstructive lung disease here for follow up. He was admitted to [facility] 06/18/11 with chest pain. Cardiac cath on 06/18/11 with moderate LAD disease with patent RCA stents. No flow limiting lesions. Normal LVEF. D Dimer and CE were normal. He was also noted to have tachycardia which was felt to be atrial fibrillation. Dr. Caryl Comes saw him in the hospital and started Xarelto for anticoagulation and increased metoprolol for rate control. Slurred speech and seen by Neuro. Head CT without evidence of CVA. Head MRI with questionable tiny acute infarct posterior left frontal lobe, mild small vessel disease type changes, global atrophy without hydrocephalus. Carotid artery dopplers 11/11/12 with mild (0-39%) bilateral ICA stenosis. He decided not to start Xarelto due to cost. He was started on coumadin and has been followed in the coumadin clinic. He was seen 12/23/12 and had c/o chest pressure when working in the yard. Stress myoview 1/715 with small inferior wall defect from base to apex with mild peri-infarct ischemia. LVEF=53%. Low risk study.   He is here today for follow up.  Still having chest pains, dyspnea and fatigue. He has been inactive for last two weeks due to symptoms.   Primary Care Physician: Carolann Littler  Last Lipid Profile:Lipid Panel     Component Value Date/Time   CHOL 258* 10/19/2012 0818   TRIG 353.0* 10/19/2012 0818   HDL 38.50* 10/19/2012 0818   CHOLHDL 7 10/19/2012 0818   VLDL 70.6* 10/19/2012 0818   LDLCALC 81 06/20/2011 0552     Past Medical History  Diagnosis Date  . Coronary artery disease     a. h/o Overlapping stents RCA;  b. 06/2011 Cath: patent stents, nonobs dzs, NL EF.  Marland Kitchen Hypertension   . Osteoarthritis     shoulder  . Restless leg   . Hyperlipidemia   . COPD (chronic obstructive pulmonary disease)   . Nephrolithiasis   .  SVT (supraventricular tachycardia)   . Dyspnea   . GERD (gastroesophageal reflux disease)   . DM (diabetes mellitus)     Type 2, peripheral neuropathy.  . Diabetic peripheral neuropathy   . Atrial fibrillation   . Low testosterone     Past Surgical History  Procedure Laterality Date  . Cholecystectomy      Current Outpatient Prescriptions  Medication Sig Dispense Refill  . ACCU-CHEK AVIVA PLUS test strip TEST 3 TIMES A DAY  100 strip  11  . amitriptyline (ELAVIL) 10 MG tablet Take 1 tablet (10 mg total) by mouth at bedtime.  30 tablet  2  . aspirin 81 MG tablet Take 81 mg by mouth daily.        Marland Kitchen atorvastatin (LIPITOR) 40 MG tablet Take 1 tablet (40 mg total) by mouth daily.  90 tablet  3  . Calcium Carbonate-Vitamin D (CALCIUM + D PO) Take by mouth daily.        . fenofibrate micronized (LOFIBRA) 134 MG capsule Take 1 capsule by mouth  daily before breakfast  90 capsule  3  . gabapentin (NEURONTIN) 300 MG capsule       . glimepiride (AMARYL) 4 MG tablet Take 1 tablet by mouth  daily before breakfast  90 tablet  3  . glucose blood (ACCU-CHEK AVIVA PLUS) test strip Test BS 3 times daily  100 each  6  . insulin aspart (NOVOLOG FLEXPEN) 100 UNIT/ML SOPN FlexPen Inject  5 Units into the skin 2 (two) times daily with a meal.      . Insulin Glargine (LANTUS) 100 UNIT/ML Solostar Pen INJECT 56 UNITS INTO THE SKIN 2 (TWO) TIMES DAILY.      Marland Kitchen ipratropium (ATROVENT) 0.03 % nasal spray Place 2 sprays into the nose every 12 (twelve) hours.  30 mL  1  . Lancets (ACCU-CHEK MULTICLIX) lancets Use twice daily as instructed  204 each  3  . metoprolol succinate (TOPROL-XL) 100 MG 24 hr tablet Take 1 tablet by mouth  every day immediately  following a meal  90 tablet  3  . nitroGLYCERIN (NITROSTAT) 0.4 MG SL tablet Place 0.4 mg under the tongue every 5 (five) minutes as needed. Chest pain      . omeprazole (PRILOSEC) 20 MG capsule Take 1 capsule by mouth  daily  90 capsule  3  . pramipexole (MIRAPEX)  0.125 MG tablet Take 3 tablets at night.  90 tablet  6  . testosterone cypionate (DEPO-TESTOSTERONE) 200 MG/ML injection Inject 1 mL (200 mg total) into the muscle every 28 (twenty-eight) days.  10 mL  0  . tiotropium (SPIRIVA) 18 MCG inhalation capsule Place 18 mcg into inhaler and inhale daily.      Marland Kitchen warfarin (COUMADIN) 5 MG tablet Take as directed by anticoagulation clinic  35 tablet  2  . [DISCONTINUED] metFORMIN (GLUCOPHAGE) 500 MG tablet Take 1 tablet (500 mg total) by mouth 2 (two) times daily with a meal.  60 tablet  1  . [DISCONTINUED] metoprolol (LOPRESSOR) 50 MG tablet       . [DISCONTINUED] spironolactone (ALDACTONE) 25 MG tablet        No current facility-administered medications for this visit.   Facility-Administered Medications Ordered in Other Visits  Medication Dose Route Frequency Provider Last Rate Last Dose  . testosterone cypionate (DEPOTESTOTERONE CYPIONATE) injection 200 mg  200 mg Intramuscular Q28 days [name], MD   200 mg at 12/26/11 0850  . testosterone cypionate (DEPOTESTOTERONE CYPIONATE) injection 200 mg  200 mg Intramuscular Q28 days [name], MD   200 mg at 01/23/12 0908  . testosterone cypionate (DEPOTESTOTERONE CYPIONATE) injection 200 mg  200 mg Intramuscular Q28 days [name], MD   200 mg at 02/25/12 0848  . testosterone cypionate (DEPOTESTOTERONE CYPIONATE) injection 200 mg  200 mg Intramuscular Q28 days [name], MD   200 mg at 06/03/12 1437    Allergies  Allergen Reactions  . Ace Inhibitors Other (See Comments)    cough  . Codeine Nausea Only and Rash       . Penicillins Rash    History   Social History  . Marital Status: Married    Spouse Name: N/A    Number of Children: 3  . Years of Education: N/A   Occupational History  . Retired    Social History Main Topics  . Smoking status: Former Smoker -- 1.50 packs/day for 20 years    Types: Cigarettes    Quit date: 04/05/1983  . Smokeless tobacco:  Never Used  . Alcohol Use: No  . Drug Use: No  . Sexual Activity: Not Currently   Other Topics Concern  . Not on file   Social History Narrative  . No narrative on file    Family History  Problem Relation Age of Onset  . Coronary artery disease      Male 1st degree relative <50  . Coronary artery disease  male 1st degree relative <60  . Heart disease Father   . Migraines Father   . Ulcers Father   . Alzheimer's disease Mother   . Heart disease Mother   . Heart disease Sister   . Obesity Sister     Morbid  . Arthritis Sister   . Heart disease Brother   . Arthritis Brother   . Sleep apnea Son   . Obesity Son   . Migraines Daughter   . Thyroid disease Daughter     Review of Systems:  As stated in the HPI and otherwise negative.   BP 142/98  Pulse 88  Ht 5\' 11"  (1.803 m)  Wt 281 lb (127.461 kg)  BMI 39.21 kg/m2  Physical Examination: General: Well developed, well nourished, NAD HEENT: OP clear, mucus membranes moist SKIN: warm, dry. No rashes. Neuro: No focal deficits Musculoskeletal: Muscle strength 5/5 all ext Psychiatric: Mood and affect normal Neck: No JVD, no carotid bruits, no thyromegaly, no lymphadenopathy. Lungs:Clear bilaterally, no wheezes, rhonci, crackles Cardiovascular: Regular rate and rhythm. No murmurs, gallops or rubs. Abdomen:Soft. Bowel sounds present. Non-tender.  Extremities: No lower extremity edema. Pulses are 2 + in the bilateral DP/PT.  Stress myoview 01/13/13: Stress Procedure: The patient received IV Lexiscan 0.4 mg over 15-seconds. Technetium 75m Sestamibi injected at 30-seconds. Quantitative spect images were obtained after a 45 minute delay. During the infusion of Lexiscan, the patient complained of a "strange" feeling and a headache. These symptoms resolved in recovery.  Stress ECG: No significant change from baseline ECG  QPS  Raw Data Images: Normal; no motion artifact; normal heart/lung ratio.  Stress Images: There is  decreased uptake in the inferior wall.  Rest Images: There is decreased uptake in the inferior wall.  Subtraction (SDS): mixed infarct and ischemia  Transient Ischemic Dilatation (Normal <1.22): 0.98  Lung/Heart Ratio (Normal <0.45): 0.40  Quantitative Gated Spect Images  QGS EDV: 126 ml  QGS ESV: 54 ml  Impression  Exercise Capacity: Lexiscan with no exercise.  BP Response: Normal blood pressure response.  Clinical Symptoms: Headache  ECG Impression: No significant ST segment change suggestive of ischemia.  Comparison with Prior Nuclear Study: No images to compare  Overall Impression: Low risk stress nuclear study Small inferior wall infarct from apex to base with mild peri infarct ischemia.  LV Ejection Fraction: 57%. LV Wall Motion: NL LV Function; NL Wall Motion   Assessment and Plan:   1. CORONARY ARTERY DISEASE: Recent chest pains and fatigue. He is known to have moderate LAD stenosis and patent stents RCA by cath June 2013. Stress myoview with inferior infarct involving entire inferior wall with peri-infarct ischemia. Will arrange cardiac cath 01/27/13 at [facility]. Risks and benefits reviewed with pt. He agrees to proceed. Pre-cath labs next week. Hold coumadin today then labs Tuesday.   2. Paroxysmal atrial fibrillation: He is maintaining NSR. He did not want to start Xarelto due to cost. He has been maintained on coumadin therapy. Will continue Toprol at current dose. Hold coumadin for cath.   3. Sleep apnea: He snores loudly at night, has excessive daytime fatigue with somnolence but trouble sleeping at night. Referral to Pulmonary pending.

## 2013-01-21 NOTE — Progress Notes (Signed)
Subjective:    Patient ID: Gary Partin., male    DOB: [birth year], 80 y.o.   MRN: [id number]  HPI Patient is seen for medical followup. He has multiple chronic problems including history of obesity, type 2 diabetes, peripheral neuropathy, CAD, peripheral artery disease, hypertension, history of A. fib, hyperlipidemia, COPD, chronic kidney disease. History of poor compliance.  His diabetes has been very poorly controlled- last A1c 10.4%. Takes Lantus 56 units twice a day and NovoLog 16 units 3 times a day with meals though he states he frequently skips this. Blood sugars vary considerably fasting from 149-250 range. He has occasional thirst and polyuria.  He is struggling with losing weight.  He's had some chronic dyspnea issues. Echocardiogram June 2013 ejection fraction of 40-50%. Recent nuclear stress test no ischemia. Sleep study pending. Has been referred to pulmonary.  He's had history of both peripheral neuropathy from diabetes and restless legs which has caused considerable sleep difficulties. He takes high-dose gabapentin and is also on Mirapex and we recently added back low-dose Elavil which is working somewhat. He still has difficulty sleeping even with this at times.  Past Medical History  Diagnosis Date  . Coronary artery disease     a. h/o Overlapping stents RCA;  b. 06/2011 Cath: patent stents, nonobs dzs, NL EF.  Marland Kitchen Hypertension   . Osteoarthritis     shoulder  . Restless leg   . Hyperlipidemia   . COPD (chronic obstructive pulmonary disease)   . Nephrolithiasis   . SVT (supraventricular tachycardia)   . Dyspnea   . GERD (gastroesophageal reflux disease)   . DM (diabetes mellitus)     Type 2, peripheral neuropathy.  . Diabetic peripheral neuropathy   . Atrial fibrillation   . Low testosterone    Past Surgical History  Procedure Laterality Date  . Cholecystectomy      reports that he quit smoking about 29 years ago. His smoking use included Cigarettes. He has a 30  pack-year smoking history. He has never used smokeless tobacco. He reports that he does not drink alcohol or use illicit drugs. family history includes Alzheimer's disease in his mother; Arthritis in his brother and sister; Coronary artery disease in some other family members; Heart disease in his brother, father, mother, and sister; Migraines in his daughter and father; Obesity in his sister and son; Sleep apnea in his son; Thyroid disease in his daughter; Ulcers in his father. Allergies  Allergen Reactions  . Ace Inhibitors Other (See Comments)    cough  . Codeine Nausea Only and Rash       . Penicillins Rash      Review of Systems  Constitutional: Positive for fatigue.  Respiratory: Positive for shortness of breath. Negative for wheezing.   Cardiovascular: Negative for chest pain and palpitations.  Neurological: Negative for dizziness and syncope.       Objective:   Physical Exam  Constitutional: He appears well-developed and well-nourished.  Neck: Neck supple. No thyromegaly present.  Cardiovascular: Normal rate.   Pulmonary/Chest: Effort normal and breath sounds normal. No respiratory distress. He has no wheezes. He has no rales.  Musculoskeletal: He exhibits no edema.  Skin:  Feet reveal no skin lesions. Good distal foot pulses. Good capillary refill. No calluses.            Assessment & Plan:  #1 type 2 diabetes. Poorly controlled. Hx of poor compliance.  Set up endocrine referral. We suspect some of this is poor compliance both  with diet and lack of exercise as well as not consistent with NovoLog use. Not a great candidate for SGLT2 med with GFR 58-although still could use [place]. #2 restless leg syndrome. Fairly well controlled with Mirapex #3 diabetic peripheral neuropathy. Recently added back low-dose Elavil to his gabapentin and symptoms are improving.  We prefer to avoid tricyclics (esp at his age) but poorly controlled on gabapentin.   #4 hypogonadism. Refill  testosterone

## 2013-01-21 NOTE — Patient Instructions (Signed)
Your physician recommends that you schedule a follow-up appointment in:  5 weeks.   Your physician recommends that you return for lab work the morning of January 26, 2013    Your physician has requested that you have a cardiac catheterization. Cardiac catheterization is used to diagnose and/or treat various heart conditions. Doctors may recommend this procedure for a number of different reasons. The most common reason is to evaluate chest pain. Chest pain can be a symptom of coronary artery disease (CAD), and cardiac catheterization can show whether plaque is narrowing or blocking your heart's arteries. This procedure is also used to evaluate the valves, as well as measure the blood flow and oxygen levels in different parts of your heart. For further information please visit [email or web address]. Please follow instruction sheet, as given. Scheduled for January 27, 2013

## 2013-01-21 NOTE — Progress Notes (Signed)
Pre visit review using our clinic review tool, if applicable. No additional management support is needed unless otherwise documented below in the visit note. 

## 2013-01-21 NOTE — Patient Instructions (Signed)
Consider app such as My[employer]itnessPal to help track energy expenditure and calorie intake. I will call you with endocrinology referral.

## 2013-01-22 ENCOUNTER — Encounter (HOSPITAL_COMMUNITY): Payer: Self-pay | Admitting: Pharmacy Technician

## 2013-01-26 ENCOUNTER — Other Ambulatory Visit (INDEPENDENT_AMBULATORY_CARE_PROVIDER_SITE_OTHER): Payer: Medicare Other

## 2013-01-26 ENCOUNTER — Ambulatory Visit: Payer: Medicare Other | Admitting: Family Medicine

## 2013-01-26 DIAGNOSIS — I4891 Unspecified atrial fibrillation: Secondary | ICD-10-CM

## 2013-01-26 DIAGNOSIS — E1149 Type 2 diabetes mellitus with other diabetic neurological complication: Secondary | ICD-10-CM

## 2013-01-26 DIAGNOSIS — R079 Chest pain, unspecified: Secondary | ICD-10-CM

## 2013-01-26 DIAGNOSIS — E1165 Type 2 diabetes mellitus with hyperglycemia: Secondary | ICD-10-CM

## 2013-01-26 DIAGNOSIS — N183 Chronic kidney disease, stage 3 unspecified: Secondary | ICD-10-CM

## 2013-01-26 DIAGNOSIS — I2510 Atherosclerotic heart disease of native coronary artery without angina pectoris: Secondary | ICD-10-CM

## 2013-01-26 DIAGNOSIS — E1142 Type 2 diabetes mellitus with diabetic polyneuropathy: Secondary | ICD-10-CM

## 2013-01-26 DIAGNOSIS — E1140 Type 2 diabetes mellitus with diabetic neuropathy, unspecified: Secondary | ICD-10-CM

## 2013-01-26 DIAGNOSIS — E785 Hyperlipidemia, unspecified: Secondary | ICD-10-CM

## 2013-01-26 LAB — BASIC METABOLIC PANEL
BUN: 14 mg/dL (ref 6–23)
BUN: 14 mg/dL (ref 6–23)
CO2: 25 mEq/L (ref 19–32)
CO2: 25 mEq/L (ref 19–32)
Calcium: 9.3 mg/dL (ref 8.4–10.5)
Calcium: 9.3 mg/dL (ref 8.4–10.5)
Chloride: 103 mEq/L (ref 96–112)
Chloride: 102 mEq/L (ref 96–112)
Creatinine, Ser: 1.3 mg/dL (ref 0.4–1.5)
Creatinine, Ser: 1.4 mg/dL (ref 0.4–1.5)
GFR: 55.27 mL/min — AB (ref >60.00)
GFR: 53.86 mL/min — ABNORMAL LOW (ref >60.00)
GLUCOSE: 226 mg/dL — AB (ref 70–99)
GLUCOSE: 228 mg/dL — AB (ref 70–99)
POTASSIUM: 3.8 meq/L (ref 3.5–5.1)
POTASSIUM: 3.8 meq/L (ref 3.5–5.1)
Sodium: 136 mEq/L (ref 135–145)
Sodium: 137 mEq/L (ref 135–145)

## 2013-01-26 LAB — HEMOGLOBIN A1C: HEMOGLOBIN A1C: 11.3 % — AB (ref 4.6–6.5)

## 2013-01-26 LAB — CBC WITH DIFFERENTIAL/PLATELET
Basophils Absolute: 0 10*3/uL (ref 0.0–0.1)
Basophils Relative: 0.8 % (ref 0.0–3.0)
EOS ABS: 0.2 10*3/uL (ref 0.0–0.7)
EOS PCT: 3.5 % (ref 0.0–5.0)
HCT: 42.6 % (ref 39.0–52.0)
Hemoglobin: 14.3 g/dL (ref 13.0–17.0)
Lymphocytes Relative: 33.2 % (ref 12.0–46.0)
Lymphs Abs: 2 10*3/uL (ref 0.7–4.0)
MCHC: 33.5 g/dL (ref 30.0–36.0)
MCV: 92.8 fl (ref 78.0–100.0)
MONO ABS: 0.5 10*3/uL (ref 0.1–1.0)
Monocytes Relative: 8.4 % (ref 3.0–12.0)
NEUTROS PCT: 54.1 % (ref 43.0–77.0)
Neutro Abs: 3.3 10*3/uL (ref 1.4–7.7)
Platelets: 195 10*3/uL (ref 150.0–400.0)
RBC: 4.59 Mil/uL (ref 4.22–5.81)
RDW: 14.2 % (ref 11.5–14.6)
WBC: 6.1 10*3/uL (ref 4.5–10.5)

## 2013-01-26 LAB — LIPID PANEL
Cholesterol: 181 mg/dL (ref 0–200)
HDL: 32.9 mg/dL — ABNORMAL LOW (ref >39.00)
Total CHOL/HDL Ratio: 6
Triglycerides: 511 mg/dL — ABNORMAL HIGH (ref 0.0–149.0)
VLDL: 102.2 mg/dL — AB (ref 0.0–40.0)

## 2013-01-26 LAB — PROTIME-INR
INR: 1 ratio (ref 0.8–1.0)
PROTHROMBIN TIME: 11 s (ref 10.2–12.4)

## 2013-01-26 LAB — LDL CHOLESTEROL, DIRECT: Direct LDL: 87.9 mg/dL

## 2013-01-27 ENCOUNTER — Other Ambulatory Visit: Payer: Self-pay | Admitting: Family Medicine

## 2013-01-27 ENCOUNTER — Ambulatory Visit (HOSPITAL_COMMUNITY)
Admission: RE | Admit: 2013-01-27 | Discharge: 2013-01-27 | Disposition: A | Discharge status: Home / Self Care | Payer: Medicare Other | Source: Ambulatory Visit | Attending: Cardiovascular Disease | Admitting: Cardiovascular Disease

## 2013-01-27 ENCOUNTER — Encounter (HOSPITAL_COMMUNITY)
Admission: RE | Disposition: A | Discharge status: Home / Self Care | Payer: Self-pay | Source: Ambulatory Visit | Attending: Cardiovascular Disease

## 2013-01-27 DIAGNOSIS — E1149 Type 2 diabetes mellitus with other diabetic neurological complication: Secondary | ICD-10-CM | POA: Insufficient documentation

## 2013-01-27 DIAGNOSIS — E1142 Type 2 diabetes mellitus with diabetic polyneuropathy: Secondary | ICD-10-CM | POA: Insufficient documentation

## 2013-01-27 DIAGNOSIS — I498 Other specified cardiac arrhythmias: Secondary | ICD-10-CM | POA: Insufficient documentation

## 2013-01-27 DIAGNOSIS — Z87891 Personal history of nicotine dependence: Secondary | ICD-10-CM | POA: Insufficient documentation

## 2013-01-27 DIAGNOSIS — Z9861 Coronary angioplasty status: Secondary | ICD-10-CM | POA: Insufficient documentation

## 2013-01-27 DIAGNOSIS — E785 Hyperlipidemia, unspecified: Secondary | ICD-10-CM | POA: Insufficient documentation

## 2013-01-27 DIAGNOSIS — R0989 Other specified symptoms and signs involving the circulatory and respiratory systems: Secondary | ICD-10-CM | POA: Insufficient documentation

## 2013-01-27 DIAGNOSIS — R079 Chest pain, unspecified: Secondary | ICD-10-CM | POA: Insufficient documentation

## 2013-01-27 DIAGNOSIS — R5383 Other fatigue: Secondary | ICD-10-CM

## 2013-01-27 DIAGNOSIS — I2510 Atherosclerotic heart disease of native coronary artery without angina pectoris: Secondary | ICD-10-CM

## 2013-01-27 DIAGNOSIS — R0609 Other forms of dyspnea: Secondary | ICD-10-CM | POA: Insufficient documentation

## 2013-01-27 DIAGNOSIS — E1165 Type 2 diabetes mellitus with hyperglycemia: Principal | ICD-10-CM

## 2013-01-27 DIAGNOSIS — Z7982 Long term (current) use of aspirin: Secondary | ICD-10-CM | POA: Insufficient documentation

## 2013-01-27 DIAGNOSIS — E1140 Type 2 diabetes mellitus with diabetic neuropathy, unspecified: Secondary | ICD-10-CM

## 2013-01-27 DIAGNOSIS — Z8249 Family history of ischemic heart disease and other diseases of the circulatory system: Secondary | ICD-10-CM | POA: Insufficient documentation

## 2013-01-27 DIAGNOSIS — R5381 Other malaise: Secondary | ICD-10-CM | POA: Insufficient documentation

## 2013-01-27 DIAGNOSIS — M19019 Primary osteoarthritis, unspecified shoulder: Secondary | ICD-10-CM | POA: Insufficient documentation

## 2013-01-27 DIAGNOSIS — Z7901 Long term (current) use of anticoagulants: Secondary | ICD-10-CM | POA: Insufficient documentation

## 2013-01-27 DIAGNOSIS — R9439 Abnormal result of other cardiovascular function study: Secondary | ICD-10-CM

## 2013-01-27 DIAGNOSIS — K219 Gastro-esophageal reflux disease without esophagitis: Secondary | ICD-10-CM | POA: Insufficient documentation

## 2013-01-27 DIAGNOSIS — I6529 Occlusion and stenosis of unspecified carotid artery: Secondary | ICD-10-CM | POA: Insufficient documentation

## 2013-01-27 DIAGNOSIS — G4730 Sleep apnea, unspecified: Secondary | ICD-10-CM | POA: Insufficient documentation

## 2013-01-27 DIAGNOSIS — I4891 Unspecified atrial fibrillation: Secondary | ICD-10-CM | POA: Insufficient documentation

## 2013-01-27 DIAGNOSIS — Z794 Long term (current) use of insulin: Secondary | ICD-10-CM | POA: Insufficient documentation

## 2013-01-27 DIAGNOSIS — I10 Essential (primary) hypertension: Secondary | ICD-10-CM | POA: Insufficient documentation

## 2013-01-27 DIAGNOSIS — J4489 Other specified chronic obstructive pulmonary disease: Secondary | ICD-10-CM | POA: Insufficient documentation

## 2013-01-27 DIAGNOSIS — J449 Chronic obstructive pulmonary disease, unspecified: Secondary | ICD-10-CM | POA: Insufficient documentation

## 2013-01-27 HISTORY — PX: LEFT HEART CATHETERIZATION WITH CORONARY ANGIOGRAM: SHX5451

## 2013-01-27 LAB — GLUCOSE, CAPILLARY: Glucose-Capillary: 176 mg/dL — ABNORMAL HIGH (ref 70–99)

## 2013-01-27 SURGERY — LEFT HEART CATHETERIZATION WITH CORONARY ANGIOGRAM
Anesthesia: LOCAL

## 2013-01-27 MED ORDER — LIDOCAINE HCL (PF) 1 % IJ SOLN
INTRAMUSCULAR | Status: AC
  Filled 2013-01-27: qty 30

## 2013-01-27 MED ORDER — DIAZEPAM 5 MG PO TABS
5.0000 mg | ORAL_TABLET | ORAL | Status: AC
  Administered 2013-01-27: 5 mg via ORAL

## 2013-01-27 MED ORDER — SODIUM CHLORIDE 0.9 % IV SOLN: INTRAVENOUS | Status: AC

## 2013-01-27 MED ORDER — ONDANSETRON HCL 4 MG/2ML IJ SOLN: 4.0000 mg | Freq: Four times a day (QID) | INTRAMUSCULAR | Status: DC | PRN

## 2013-01-27 MED ORDER — ASPIRIN 81 MG PO CHEW
CHEWABLE_TABLET | ORAL | Status: AC
  Administered 2013-01-27: 81 mg via ORAL
  Filled 2013-01-27: qty 1

## 2013-01-27 MED ORDER — NITROGLYCERIN 0.2 MG/ML ON CALL CATH LAB
INTRAVENOUS | Status: AC
  Filled 2013-01-27: qty 1

## 2013-01-27 MED ORDER — DIAZEPAM 5 MG PO TABS
ORAL_TABLET | ORAL | Status: AC
  Administered 2013-01-27: 5 mg via ORAL
  Filled 2013-01-27: qty 1

## 2013-01-27 MED ORDER — SODIUM CHLORIDE 0.9 % IV SOLN: 250.0000 mL | INTRAVENOUS | Status: DC | PRN

## 2013-01-27 MED ORDER — ACETAMINOPHEN 325 MG PO TABS: 650.0000 mg | ORAL_TABLET | ORAL | Status: DC | PRN

## 2013-01-27 MED ORDER — SODIUM CHLORIDE 0.9 % IJ SOLN: 3.0000 mL | INTRAMUSCULAR | Status: DC | PRN

## 2013-01-27 MED ORDER — SODIUM CHLORIDE 0.9 % IV SOLN
INTRAVENOUS | Status: DC
  Administered 2013-01-27: 09:00:00 via INTRAVENOUS

## 2013-01-27 MED ORDER — MIDAZOLAM HCL 2 MG/2ML IJ SOLN
INTRAMUSCULAR | Status: AC
  Filled 2013-01-27: qty 2

## 2013-01-27 MED ORDER — SODIUM CHLORIDE 0.9 % IJ SOLN: 3.0000 mL | Freq: Two times a day (BID) | INTRAMUSCULAR | Status: DC

## 2013-01-27 MED ORDER — FENTANYL CITRATE 0.05 MG/ML IJ SOLN
INTRAMUSCULAR | Status: AC
  Filled 2013-01-27: qty 2

## 2013-01-27 MED ORDER — HEPARIN (PORCINE) IN NACL 2-0.9 UNIT/ML-% IJ SOLN
INTRAMUSCULAR | Status: AC
  Filled 2013-01-27: qty 1000

## 2013-01-27 MED ORDER — ASPIRIN 81 MG PO CHEW
81.0000 mg | CHEWABLE_TABLET | ORAL | Status: AC
  Administered 2013-01-27: 81 mg via ORAL

## 2013-01-27 NOTE — Progress Notes (Signed)
Received pt from cath procedure alert and able to tolerate food and fluids.  Pt denies any discomfort at this time.

## 2013-01-27 NOTE — CV Procedure (Signed)
Cardiac Catheterization Operative Report  Gary Yates [id number] 1/21/201510:21 AM Gary Post, MD  Procedure Performed:  1. Left Heart Catheterization 2. Selective Coronary Angiography 3. Mynx femoral closure device right femoral artery  Operator: Gary Chandler, MD  Indication:  80 yo WM with history of HTN, hyperlipidemia, morbid obesity, DM, CAD, atrial fibrillation and obstructive lung disease here for cardiac cath. He was admitted to [facility] 06/18/11 with chest pain. Cardiac cath on 06/18/11 with moderate LAD disease with patent RCA stents. No flow limiting lesions. Normal LVEF. D Dimer and CE were normal. He was also noted to have tachycardia which was felt to be atrial fibrillation. Dr. Caryl Yates saw him in the hospital and started Xarelto for anticoagulation and increased metoprolol for rate control. Slurred speech and seen by Neuro. Head CT without evidence of CVA. Head MRI with questionable tiny acute infarct posterior left frontal lobe, mild small vessel disease type changes, global atrophy without hydrocephalus. Carotid artery dopplers 11/11/12 with mild (0-39%) bilateral ICA stenosis. He decided not to start Xarelto due to cost. He was started on coumadin and has been followed in the coumadin clinic. He was seen 12/23/12 and had c/o chest pressure when working in the yard. Stress myoview 1/715 with small inferior wall defect from base to apex with mild peri-infarct ischemia. LVEF=53%. Low risk study.  Still having chest pains, dyspnea and fatigue so cardiac cath arranged today to exclude progression of CAD.                                    Procedure Details: The risks, benefits, complications, treatment options, and expected outcomes were discussed with the patient. The patient and/or family concurred with the proposed plan, giving informed consent. The patient was brought to the cath lab after IV hydration was begun and oral premedication was given. The  patient was further sedated with Versed and Fentanyl. The right groin was prepped and draped in the usual manner. Using the modified Seldinger access technique, a 5 French sheath was placed in the right femoral artery. Standard diagnostic catheters were used to perform selective coronary angiography. A pigtail catheter was used to measure LV pressures.   There were no immediate complications. The patient was taken to the recovery area in stable condition.   Hemodynamic Findings: Central aortic pressure: 144/82 Left ventricular pressure: 146/10/13  Angiographic Findings:  Left main: No obstructive disease.   Left Anterior Descending Artery:  Large caliber vessel in the proximal segment with trifurcation in the mid segment into a large septal perforating branch, moderate caliber bifurcating diagonal branch and small to moderate caliber continuation of the LAD. There appears to be a 40-50% stenosis at the trifurcation in the mid LAD. The diagonal branch bifurcates. The superior branch of the diagonal after the bifurcation is small in caliber with 70% stenosis.   Circumflex Artery: Large caliber vessel with large caliber obtuse marginal branch. The proximal and mid vessel has diffuse 30% stenosis, unchanged. The obtuse marginal branch has diffuse 30% stenosis in the proximal segment of the vessel.   Right Coronary Artery: Large caliber dominant vessel with patent proximal and mid stented segment with 20% stent restenosis. Mild plaque in the large caliber PDA.   Left Ventricular Angiogram: Deferred.   Impression: 1. Double vessel CAD with patent stents RCA, moderate disease in the LAD which is unchanged from last cath in 2013.  2. Dyspnea  and fatigue likely multifactorial 3. No focal targets for PCI  Recommendations: Continue medical management of CAD. Continue to attempt weight loss.        Complications:  None. The patient tolerated the procedure well.

## 2013-01-27 NOTE — Interval H&P Note (Signed)
History and Physical Interval Note:  01/27/2013 9:38 AM  Gary Slim.  has presented today for cardiac cath with the diagnosis of Chest pain/CAD.  The various methods of treatment have been discussed with the patient and family. After consideration of risks, benefits and other options for treatment, the patient has consented to  Procedure(s): LEFT HEART CATHETERIZATION WITH CORONARY ANGIOGRAM (N/A) as a surgical intervention .  The patient's history has been reviewed, patient examined, no change in status, stable for surgery.  I have reviewed the patient's chart and labs.  Questions were answered to the patient's satisfaction.    Cath Lab Visit (complete for each Cath Lab visit)  Clinical Evaluation Leading to the Procedure:   ACS: no  Non-ACS:    Anginal Classification: CCS III  Anti-ischemic medical therapy: Minimal Therapy (1 class of medications)  Non-Invasive Test Results: Low-risk stress test findings: cardiac mortality <1%/year  Prior CABG: No previous CABG         Neely Kammerer

## 2013-01-27 NOTE — Discharge Instructions (Signed)
Angiography, Care After Refer to this sheet in the next few weeks. These instructions provide you with information on caring for yourself after your procedure. Your health care provider may also give you more specific instructions. Your treatment has been planned according to current medical practices, but problems sometimes occur. Call your health care provider if you have any problems or questions after your procedure.  WHAT TO EXPECT AFTER THE PROCEDURE After your procedure, it is typical to have the following sensations:  Minor discomfort or tenderness and a small bump at the catheter insertion site. The bump should usually decrease in size and tenderness within 1 to 2 weeks.  Any bruising will usually fade within 2 to 4 weeks. HOME CARE INSTRUCTIONS   You may need to keep taking blood thinners if they were prescribed for you. Only take over-the-counter or prescription medicines for pain, fever, or discomfort as directed by your health care provider.  Do not apply powder or lotion to the site.  Do not sit in a bathtub, swimming pool, or whirlpool for 5 to 7 days.  You may shower 24 hours after the procedure. Remove the bandage (dressing) and gently wash the site with plain soap and water. Gently pat the site dry.  Inspect the site at least twice daily.  Limit your activity for the first 48 hours. Do not bend, squat, or lift anything over 20 lb (9 kg) or as directed by your health care provider.  Do not drive home if you are discharged the day of the procedure. Have someone else drive you. Follow instructions about when you can drive or return to work. SEEK MEDICAL CARE IF:  You get lightheaded when standing up.  You have drainage (other than a small amount of blood on the dressing).  You have chills.  You have a fever.  You have redness, warmth, swelling, or pain at the insertion site. SEEK IMMEDIATE MEDICAL CARE IF:   You develop chest pain or shortness of breath, feel faint,  or pass out.  You have bleeding, swelling larger than a walnut, or drainage from the catheter insertion site.  You develop pain, discoloration, coldness, or severe bruising in the leg or arm that held the catheter.  You develop bleeding from any other place, such as the bowels. You may see bright red blood in your urine or stools, or your stools may appear black and tarry.  You have heavy bleeding from the site. If this happens, hold pressure on the site. MAKE SURE YOU:  Understand these instructions.  Will watch your condition.  Will get help right away if you are not doing well or get worse. Call 911 is bleeding is not under control Document Released: 07/12/2004 Document Revised: 08/26/2012 Document Reviewed: 05/18/2012 [facility] Patient Information 2014 [place].

## 2013-01-27 NOTE — H&P (View-Only) (Signed)
History of Present Illness: 80 yo WM with history of HTN, hyperlipidemia, morbid obesity, DM, CAD, atrial fibrillation and obstructive lung disease here for follow up. He was admitted to [facility] 06/18/11 with chest pain. Cardiac cath on 06/18/11 with moderate LAD disease with patent RCA stents. No flow limiting lesions. Normal LVEF. D Dimer and CE were normal. He was also noted to have tachycardia which was felt to be atrial fibrillation. Dr. Caryl Comes saw him in the hospital and started Xarelto for anticoagulation and increased metoprolol for rate control. Slurred speech and seen by Neuro. Head CT without evidence of CVA. Head MRI with questionable tiny acute infarct posterior left frontal lobe, mild small vessel disease type changes, global atrophy without hydrocephalus. Carotid artery dopplers 11/11/12 with mild (0-39%) bilateral ICA stenosis. He decided not to start Xarelto due to cost. He was started on coumadin and has been followed in the coumadin clinic. He was seen 12/23/12 and had c/o chest pressure when working in the yard. Stress myoview 1/715 with small inferior wall defect from base to apex with mild peri-infarct ischemia. LVEF=53%. Low risk study.   He is here today for follow up.  Still having chest pains, dyspnea and fatigue. He has been inactive for last two weeks due to symptoms.   Primary Care Physician: Carolann Littler  Last Lipid Profile:Lipid Panel     Component Value Date/Time   CHOL 258* 10/19/2012 0818   TRIG 353.0* 10/19/2012 0818   HDL 38.50* 10/19/2012 0818   CHOLHDL 7 10/19/2012 0818   VLDL 70.6* 10/19/2012 0818   LDLCALC 81 06/20/2011 0552     Past Medical History  Diagnosis Date  . Coronary artery disease     a. h/o Overlapping stents RCA;  b. 06/2011 Cath: patent stents, nonobs dzs, NL EF.  Marland Kitchen Hypertension   . Osteoarthritis     shoulder  . Restless leg   . Hyperlipidemia   . COPD (chronic obstructive pulmonary disease)   . Nephrolithiasis   .  SVT (supraventricular tachycardia)   . Dyspnea   . GERD (gastroesophageal reflux disease)   . DM (diabetes mellitus)     Type 2, peripheral neuropathy.  . Diabetic peripheral neuropathy   . Atrial fibrillation   . Low testosterone     Past Surgical History  Procedure Laterality Date  . Cholecystectomy      Current Outpatient Prescriptions  Medication Sig Dispense Refill  . ACCU-CHEK AVIVA PLUS test strip TEST 3 TIMES A DAY  100 strip  11  . amitriptyline (ELAVIL) 10 MG tablet Take 1 tablet (10 mg total) by mouth at bedtime.  30 tablet  2  . aspirin 81 MG tablet Take 81 mg by mouth daily.        Marland Kitchen atorvastatin (LIPITOR) 40 MG tablet Take 1 tablet (40 mg total) by mouth daily.  90 tablet  3  . Calcium Carbonate-Vitamin D (CALCIUM + D PO) Take by mouth daily.        . fenofibrate micronized (LOFIBRA) 134 MG capsule Take 1 capsule by mouth  daily before breakfast  90 capsule  3  . gabapentin (NEURONTIN) 300 MG capsule       . glimepiride (AMARYL) 4 MG tablet Take 1 tablet by mouth  daily before breakfast  90 tablet  3  . glucose blood (ACCU-CHEK AVIVA PLUS) test strip Test BS 3 times daily  100 each  6  . insulin aspart (NOVOLOG FLEXPEN) 100 UNIT/ML SOPN FlexPen Inject  5 Units into the skin 2 (two) times daily with a meal.      . Insulin Glargine (LANTUS) 100 UNIT/ML Solostar Pen INJECT 56 UNITS INTO THE SKIN 2 (TWO) TIMES DAILY.      Marland Kitchen ipratropium (ATROVENT) 0.03 % nasal spray Place 2 sprays into the nose every 12 (twelve) hours.  30 mL  1  . Lancets (ACCU-CHEK MULTICLIX) lancets Use twice daily as instructed  204 each  3  . metoprolol succinate (TOPROL-XL) 100 MG 24 hr tablet Take 1 tablet by mouth  every day immediately  following a meal  90 tablet  3  . nitroGLYCERIN (NITROSTAT) 0.4 MG SL tablet Place 0.4 mg under the tongue every 5 (five) minutes as needed. Chest pain      . omeprazole (PRILOSEC) 20 MG capsule Take 1 capsule by mouth  daily  90 capsule  3  . pramipexole (MIRAPEX)  0.125 MG tablet Take 3 tablets at night.  90 tablet  6  . testosterone cypionate (DEPO-TESTOSTERONE) 200 MG/ML injection Inject 1 mL (200 mg total) into the muscle every 28 (twenty-eight) days.  10 mL  0  . tiotropium (SPIRIVA) 18 MCG inhalation capsule Place 18 mcg into inhaler and inhale daily.      Marland Kitchen warfarin (COUMADIN) 5 MG tablet Take as directed by anticoagulation clinic  35 tablet  2  . [DISCONTINUED] metFORMIN (GLUCOPHAGE) 500 MG tablet Take 1 tablet (500 mg total) by mouth 2 (two) times daily with a meal.  60 tablet  1  . [DISCONTINUED] metoprolol (LOPRESSOR) 50 MG tablet       . [DISCONTINUED] spironolactone (ALDACTONE) 25 MG tablet        No current facility-administered medications for this visit.   Facility-Administered Medications Ordered in Other Visits  Medication Dose Route Frequency Provider Last Rate Last Dose  . testosterone cypionate (DEPOTESTOTERONE CYPIONATE) injection 200 mg  200 mg Intramuscular Q28 days [name], MD   200 mg at 12/26/11 0850  . testosterone cypionate (DEPOTESTOTERONE CYPIONATE) injection 200 mg  200 mg Intramuscular Q28 days [name], MD   200 mg at 01/23/12 0908  . testosterone cypionate (DEPOTESTOTERONE CYPIONATE) injection 200 mg  200 mg Intramuscular Q28 days [name], MD   200 mg at 02/25/12 0848  . testosterone cypionate (DEPOTESTOTERONE CYPIONATE) injection 200 mg  200 mg Intramuscular Q28 days [name], MD   200 mg at 06/03/12 1437    Allergies  Allergen Reactions  . Ace Inhibitors Other (See Comments)    cough  . Codeine Nausea Only and Rash       . Penicillins Rash    History   Social History  . Marital Status: Married    Spouse Name: N/A    Number of Children: 3  . Years of Education: N/A   Occupational History  . Retired    Social History Main Topics  . Smoking status: Former Smoker -- 1.50 packs/day for 20 years    Types: Cigarettes    Quit date: 04/05/1983  . Smokeless tobacco:  Never Used  . Alcohol Use: No  . Drug Use: No  . Sexual Activity: Not Currently   Other Topics Concern  . Not on file   Social History Narrative  . No narrative on file    Family History  Problem Relation Age of Onset  . Coronary artery disease      Male 1st degree relative <50  . Coronary artery disease  male 1st degree relative <60  . Heart disease Father   . Migraines Father   . Ulcers Father   . Alzheimer's disease Mother   . Heart disease Mother   . Heart disease Sister   . Obesity Sister     Morbid  . Arthritis Sister   . Heart disease Brother   . Arthritis Brother   . Sleep apnea Son   . Obesity Son   . Migraines Daughter   . Thyroid disease Daughter     Review of Systems:  As stated in the HPI and otherwise negative.   BP 142/98  Pulse 88  Ht 5\' 11"  (1.803 m)  Wt 281 lb (127.461 kg)  BMI 39.21 kg/m2  Physical Examination: General: Well developed, well nourished, NAD HEENT: OP clear, mucus membranes moist SKIN: warm, dry. No rashes. Neuro: No focal deficits Musculoskeletal: Muscle strength 5/5 all ext Psychiatric: Mood and affect normal Neck: No JVD, no carotid bruits, no thyromegaly, no lymphadenopathy. Lungs:Clear bilaterally, no wheezes, rhonci, crackles Cardiovascular: Regular rate and rhythm. No murmurs, gallops or rubs. Abdomen:Soft. Bowel sounds present. Non-tender.  Extremities: No lower extremity edema. Pulses are 2 + in the bilateral DP/PT.  Stress myoview 01/13/13: Stress Procedure: The patient received IV Lexiscan 0.4 mg over 15-seconds. Technetium 4m Sestamibi injected at 30-seconds. Quantitative spect images were obtained after a 45 minute delay. During the infusion of Lexiscan, the patient complained of a "strange" feeling and a headache. These symptoms resolved in recovery.  Stress ECG: No significant change from baseline ECG  QPS  Raw Data Images: Normal; no motion artifact; normal heart/lung ratio.  Stress Images: There is  decreased uptake in the inferior wall.  Rest Images: There is decreased uptake in the inferior wall.  Subtraction (SDS): mixed infarct and ischemia  Transient Ischemic Dilatation (Normal <1.22): 0.98  Lung/Heart Ratio (Normal <0.45): 0.40  Quantitative Gated Spect Images  QGS EDV: 126 ml  QGS ESV: 54 ml  Impression  Exercise Capacity: Lexiscan with no exercise.  BP Response: Normal blood pressure response.  Clinical Symptoms: Headache  ECG Impression: No significant ST segment change suggestive of ischemia.  Comparison with Prior Nuclear Study: No images to compare  Overall Impression: Low risk stress nuclear study Small inferior wall infarct from apex to base with mild peri infarct ischemia.  LV Ejection Fraction: 57%. LV Wall Motion: NL LV Function; NL Wall Motion   Assessment and Plan:   1. CORONARY ARTERY DISEASE: Recent chest pains and fatigue. He is known to have moderate LAD stenosis and patent stents RCA by cath June 2013. Stress myoview with inferior infarct involving entire inferior wall with peri-infarct ischemia. Will arrange cardiac cath 01/27/13 at [facility]. Risks and benefits reviewed with pt. He agrees to proceed. Pre-cath labs next week. Hold coumadin today then labs Tuesday.   2. Paroxysmal atrial fibrillation: He is maintaining NSR. He did not want to start Xarelto due to cost. He has been maintained on coumadin therapy. Will continue Toprol at current dose. Hold coumadin for cath.   3. Sleep apnea: He snores loudly at night, has excessive daytime fatigue with somnolence but trouble sleeping at night. Referral to Pulmonary pending.

## 2013-01-27 NOTE — Progress Notes (Signed)
Discharge instruction given per MD order.  Pt and CG able to verbalize understanding.  Pt to car via wheelchair. 

## 2013-02-01 ENCOUNTER — Institutional Professional Consult (permissible substitution): Payer: Medicare Other | Admitting: Pulmonary Disease

## 2013-02-02 ENCOUNTER — Ambulatory Visit (INDEPENDENT_AMBULATORY_CARE_PROVIDER_SITE_OTHER): Payer: Medicare Other | Admitting: Internal Medicine

## 2013-02-02 ENCOUNTER — Encounter: Payer: Self-pay | Admitting: Internal Medicine

## 2013-02-02 VITALS — BP 132/74 | HR 91 | Temp 98.4°F | Resp 12 | Ht 69.0 in | Wt 283.0 lb

## 2013-02-02 DIAGNOSIS — E1142 Type 2 diabetes mellitus with diabetic polyneuropathy: Secondary | ICD-10-CM

## 2013-02-02 DIAGNOSIS — E1149 Type 2 diabetes mellitus with other diabetic neurological complication: Secondary | ICD-10-CM

## 2013-02-02 DIAGNOSIS — E1165 Type 2 diabetes mellitus with hyperglycemia: Principal | ICD-10-CM

## 2013-02-02 DIAGNOSIS — E1140 Type 2 diabetes mellitus with diabetic neuropathy, unspecified: Secondary | ICD-10-CM

## 2013-02-02 MED ORDER — GABAPENTIN 300 MG PO CAPS: ORAL_CAPSULE | ORAL | Status: DC

## 2013-02-02 MED ORDER — INSULIN PEN NEEDLE 31G X 5 MM MISC: Status: DC

## 2013-02-02 MED ORDER — INSULIN GLARGINE 100 UNIT/ML SOLOSTAR PEN: 50.0000 [IU] | PEN_INJECTOR | Freq: Two times a day (BID) | SUBCUTANEOUS | Status: DC

## 2013-02-02 MED ORDER — INSULIN ASPART 100 UNIT/ML FLEXPEN: PEN_INJECTOR | SUBCUTANEOUS | Status: DC

## 2013-02-02 NOTE — Patient Instructions (Addendum)
Please decrease Lantus to 50 units 2x a day.  Increase NovoLog as follows: - breakfast: Cereals + milk: 22 units Eggs + ham: 16 units - lunch: 20 units - dinner: 20 units  Please do not skip doses of insulin. Inject insulin 15 min before a meal.  Please stop Amaryl.  Start a B-complex vitamin if you are not taking it now. Increase Neurontin to 1200 mg at night. Use MagniLife diabetic cream and use on feet.  PATIENT INSTRUCTIONS FOR TYPE 2 DIABETES:  **Please join MyChart!** - see attached instructions about how to join   DIET AND EXERCISE Diet and exercise is an important part of diabetic treatment.  We recommended aerobic exercise in the form of brisk walking (working between 40-60% of maximal aerobic capacity, similar to brisk walking) for 150 minutes per week (such as 30 minutes five days per week) along with 3 times per week performing 'resistance' training (using various gauge rubber tubes with handles) 5-10 exercises involving the major muscle groups (upper body, lower body and core) performing 10-15 repetitions (or near fatigue) each exercise. Start at half the above goal but build slowly to reach the above goals. If limited by weight, joint pain, or disability, we recommend daily walking in a swimming pool with water up to waist to reduce pressure from joints while allow for adequate exercise.    BLOOD GLUCOSES Monitoring your blood glucoses is important for continued management of your diabetes. Please check your blood glucoses 2-4 times a day: fasting, before meals and at bedtime (you can rotate these measurements - e.g. one day check before the 3 meals, the next day check before 2 of the meals and before bedtime, etc.   HYPOGLYCEMIA (low blood sugar) Hypoglycemia is usually a reaction to not eating, exercising, or taking too much insulin/ other diabetes drugs.  Symptoms include tremors, sweating, hunger, confusion, headache, etc. Treat IMMEDIATELY with 15 grams of Carbs:    4 glucose tablets    cup regular juice/soda   2 tablespoons raisins   4 teaspoons sugar   1 tablespoon honey Recheck blood glucose in 15 mins and repeat above if still symptomatic/blood glucose <100. Please contact our office at [phone] if you have questions about how to next handle your insulin.  RECOMMENDATIONS TO REDUCE YOUR RISK OF DIABETIC COMPLICATIONS: * Take your prescribed MEDICATION(S). * Follow a DIABETIC diet: Complex carbs, fiber rich foods, heart healthy fish twice weekly, (monounsaturated and polyunsaturated) fats * AVOID saturated/trans fats, high fat foods, >2,300 mg salt per day. * EXERCISE at least 5 times a week for 30 minutes or preferably daily.  * DO NOT SMOKE OR DRINK more than 1 drink a day. * Check your FEET every day. Do not wear tightfitting shoes. Contact us if you develop an ulcer * See your EYE doctor once a year or more if needed * Get a FLU shot once a year * Get a PNEUMONIA vaccine once before and once after age 61 years  GOALS:  * Your Hemoglobin A1c of <7%  * fasting sugars need to be <130 * after meals sugars need to be <180 (2h after you start eating) * Your Systolic BP should be [id number] or lower  * Your Diastolic BP should be 80 or lower  * Your HDL (Good Cholesterol) should be 40 or higher  * Your LDL (Bad Cholesterol) should be 100 or lower  * Your Triglycerides should be 150 or lower  * Your Urine microalbumin (kidney function) should be <30 *  Your Body Mass Index should be 25 or lower   We will be glad to help you achieve these goals. Our telephone number is: [phone].

## 2013-02-02 NOTE — Progress Notes (Signed)
Patient ID: Gary Mckinzie., male   DOB: 15-Feb-1933, 80 y.o.   MRN: [id number]  HPI: Gary Sirbaugh. is a 80 y.o.-year-old male, referred by his PCP, Dr. Elease Hashimoto, for management of DM2, insulin-dependent, uncontrolled, with complications (CAD, PN, CKD stage 3).  Patient has been diagnosed with diabetes in 1996; he started insulin in 2011.  Last hemoglobin A1c was: Lab Results  Component Value Date   HGBA1C 11.3* 01/26/2013   HGBA1C 11.5* 01/21/2013   HGBA1C 10.4* 10/19/2012   Pt is on a regimen of: - Lantus 56 units 2x a day - pens - NovoLog 14-16 units before every meal - pens - takes 2/3 a day - Glimepiride 4 mg daily in am  In May of every year goes into doughnut hole.  Pt checks his sugars 2x a day and they are: - am: 230-280 (this am: 163) - 2h after b'fast: n/c - before lunch: n/c - 1h-2h after lunch: 230-280 - before dinner: 200s - 2h after dinner: n/c - bedtime: 200s - nighttime: n/c Meter: AccuChek. No lows. Lowest sugar was 70, 6 mo ago; he has hypoglycemia awareness at 75.  Highest sugar was 400.  Pt's meals are: - Breakfast: cereal - frosted, miniwheats, cheerios + 2% milk + splenda; bacon and eggs; grits - Lunch: sandwich - Dinner: meat + veggies + starch, ice tea + jello  - Snacks: 2x vanilla wafers  - had CKD stage 3, last BUN/creatinine:  Lab Results  Component Value Date   BUN 14 01/26/2013   CREATININE 1.3 01/26/2013   - last set of lipids: Lab Results  Component Value Date   CHOL 181 01/26/2013   HDL 32.90* 01/26/2013   LDLCALC 81 06/20/2011   LDLDIRECT 87.9 01/26/2013   TRIG 511.0 Triglyceride is over 400; calculations on Lipids are invalid.* 01/26/2013   CHOLHDL 6 01/26/2013  He is on a statin. - last eye exam was 2013 - Dr. Gershon Crane. + cataract. ? DR.  - no numbness and tingling in his feet >> takes Neurontin 600-600-900. He started Pramlintide >> pain much improved. He takes B12 po vitamin.   Pt has FH of DM in brother.  ROS: Constitutional: +  weight gain, + fatigue, no subjective hyperthermia/hypothermia, + nocturia; + poor sleep Eyes:+  blurry vision, no xerophthalmia ENT: no sore throat, no nodules palpated in throat, no dysphagia/odynophagia, + hoarseness, + tinnitus, + decreased hearing Cardiovascular: had CP/SOB/palpitations/leg swelling Respiratory: no cough/+ SOB Gastrointestinal: no N/V/D/C Musculoskeletal: no muscle/joint aches Skin: no rashes, + easy bruising Neurological: no tremors/numbness/tingling/dizziness, + HA Psychiatric: no depression/anxiety  Past Medical History  Diagnosis Date  . Coronary artery disease     a. h/o Overlapping stents RCA;  b. 06/2011 Cath: patent stents, nonobs dzs, NL EF.  Marland Kitchen Hypertension   . Osteoarthritis     shoulder  . Restless leg   . Hyperlipidemia   . COPD (chronic obstructive pulmonary disease)   . Nephrolithiasis   . SVT (supraventricular tachycardia)   . Dyspnea   . GERD (gastroesophageal reflux disease)   . DM (diabetes mellitus)     Type 2, peripheral neuropathy.  . Diabetic peripheral neuropathy   . Atrial fibrillation   . Low testosterone    Past Surgical History  Procedure Laterality Date  . Cholecystectomy    . Cardiac catheterization  01/2013   History   Social History  . Marital Status: Married    Spouse Name: N/A    Number of Children: 3  . Years of  Education: N/A   Occupational History  . Retired    Social History Main Topics  . Smoking status: Former Smoker -- 1.50 packs/day for 20 years    Types: Cigarettes    Quit date: 04/05/1983  . Smokeless tobacco: Never Used  . Alcohol Use: No  . Drug Use: No  . Sexual Activity: Not Currently   Other Topics Concern  . Not on file   Social History Narrative  . No narrative on file   Current Outpatient Prescriptions on File Prior to Visit  Medication Sig Dispense Refill  . ACCU-CHEK AVIVA PLUS test strip TEST 3 TIMES A DAY  100 strip  11  . amitriptyline (ELAVIL) 10 MG tablet Take 1 tablet (10 mg  total) by mouth at bedtime.  30 tablet  2  . aspirin 81 MG tablet Take 81 mg by mouth daily.        Marland Kitchen atorvastatin (LIPITOR) 40 MG tablet Take 1 tablet (40 mg total) by mouth daily.  90 tablet  3  . Calcium Carbonate-Vitamin D (CALCIUM + D PO) Take 1 tablet by mouth daily.       . fenofibrate micronized (LOFIBRA) 134 MG capsule Take 1 capsule by mouth  daily before breakfast  90 capsule  3  . glucose blood (ACCU-CHEK AVIVA PLUS) test strip Test BS 3 times daily  100 each  6  . ipratropium (ATROVENT) 0.03 % nasal spray Place 2 sprays into the nose every 12 (twelve) hours.  30 mL  1  . Lancets (ACCU-CHEK MULTICLIX) lancets Use twice daily as instructed  204 each  3  . metoprolol succinate (TOPROL-XL) 100 MG 24 hr tablet Take 1 tablet by mouth  every day immediately  following a meal  90 tablet  3  . nitroGLYCERIN (NITROSTAT) 0.4 MG SL tablet Place 0.4 mg under the tongue every 5 (five) minutes as needed. Chest pain      . omeprazole (PRILOSEC) 20 MG capsule Take 1 capsule by mouth  daily  90 capsule  3  . pramipexole (MIRAPEX) 0.125 MG tablet Take 0.375 mg by mouth at bedtime. Take 3 tablets at night.      . testosterone cypionate (DEPO-TESTOSTERONE) 200 MG/ML injection Inject 1 mL (200 mg total) into the muscle every 28 (twenty-eight) days.  10 mL  0  . tiotropium (SPIRIVA) 18 MCG inhalation capsule Place 18 mcg into inhaler and inhale daily.      Marland Kitchen warfarin (COUMADIN) 5 MG tablet Take 5 mg by mouth one time only at 6 PM. Take as directed by anticoagulation clinic      . [DISCONTINUED] metFORMIN (GLUCOPHAGE) 500 MG tablet Take 1 tablet (500 mg total) by mouth 2 (two) times daily with a meal.  60 tablet  1  . [DISCONTINUED] metoprolol (LOPRESSOR) 50 MG tablet       . [DISCONTINUED] spironolactone (ALDACTONE) 25 MG tablet        Current Facility-Administered Medications on File Prior to Visit  Medication Dose Route Frequency Provider Last Rate Last Dose  . testosterone cypionate (DEPOTESTOTERONE  CYPIONATE) injection 200 mg  200 mg Intramuscular Q28 days [name], MD   200 mg at 12/26/11 0850  . testosterone cypionate (DEPOTESTOTERONE CYPIONATE) injection 200 mg  200 mg Intramuscular Q28 days [name], MD   200 mg at 01/23/12 0908  . testosterone cypionate (DEPOTESTOTERONE CYPIONATE) injection 200 mg  200 mg Intramuscular Q28 days [name], MD   200 mg at 02/25/12 0848  .  testosterone cypionate (DEPOTESTOTERONE CYPIONATE) injection 200 mg  200 mg Intramuscular Q28 days [name], MD   200 mg at 06/03/12 1437   Allergies  Allergen Reactions  . Ace Inhibitors Other (See Comments)    cough  . Codeine Nausea Only and Rash       . Penicillins Rash   Family History  Problem Relation Age of Onset  . Coronary artery disease      Male 1st degree relative <50  . Coronary artery disease      male 1st degree relative <60  . Heart disease Father   . Migraines Father   . Ulcers Father   . Alzheimer's disease Mother   . Heart disease Mother   . Heart disease Sister   . Obesity Sister     Morbid  . Arthritis Sister   . Heart disease Brother   . Arthritis Brother   . Sleep apnea Son   . Obesity Son   . Migraines Daughter   . Thyroid disease Daughter     PE: BP 132/74  Pulse 91  Temp(Src) 98.4 F (36.9 C) (Oral)  Resp 12  Ht 5\' 9"  (1.753 m)  Wt 283 lb (128.368 kg)  BMI 41.77 kg/m2  SpO2 98% Wt Readings from Last 3 Encounters:  02/02/13 283 lb (128.368 kg)  01/27/13 281 lb (127.461 kg)  01/27/13 281 lb (127.461 kg)   Constitutional: obese, in NAD Eyes: PERRLA, EOMI, no exophthalmos ENT: moist mucous membranes, no thyromegaly, no cervical lymphadenopathy Cardiovascular: RRR, No MRG Respiratory: CTA B Gastrointestinal: abdomen soft, NT, ND, BS+ Musculoskeletal: no deformities, strength intact in all 4 Skin: moist, warm, no rashes Neurological: no tremor with outstretched hands, DTR normal in all 4  ASSESSMENT: 1. DM2,  insulin-dependent, uncontrolled, with complications -  CAD - s/p 2 stents in 2004, s/p heart catheterization 01/2013 - PN - CKD stage 3.  PLAN:  1. Patient with long-standing, uncontrolled, diabetes, on oral antidiabetic regimen, which became insufficient - We discussed about options for treatment, and I suggested to:  Patient Instructions  Please decrease Lantus to 50 units 2x a day.  Increase NovoLog as follows: - breakfast: Cereals + milk: 22 units Eggs + ham: 16 units - lunch: 20 units - dinner: 20 units  Please do not skip doses of insulin. Inject insulin 15 min before a meal.  Please stop Amaryl.  Start a B-complex vitamin if you are not taking it now. Increase Neurontin to 1200 mg at night. Use MagniLife diabetic cream and use on feet.  - Strongly advised him to start checking sugars at different times of the day - check 3 times a day, rotating checks - discussed diet >> advised to reduce carbs, esp. In am - given sugar log and advised how to fill it and to bring it at next appt  - given foot care handout and explained the principles  - given instructions for hypoglycemia management "15-15 rule"  - advised for yearly eye exams - refilled NovoLog for now and pen needles. We may need to change his insulins when approaches the doughnut hole. - Return to clinic in 1 mo with sugar log

## 2013-02-03 ENCOUNTER — Telehealth: Payer: Self-pay | Admitting: Family Medicine

## 2013-02-03 ENCOUNTER — Telehealth: Payer: Self-pay | Admitting: *Deleted

## 2013-02-03 ENCOUNTER — Inpatient Hospital Stay (HOSPITAL_COMMUNITY)
Admission: EM | Admit: 2013-02-03 | Discharge: 2013-02-05 | DRG: 313 | Disposition: A | Discharge status: Home / Self Care | Payer: Medicare Other | Attending: Internal Medicine | Admitting: Internal Medicine

## 2013-02-03 ENCOUNTER — Emergency Department (HOSPITAL_COMMUNITY): Payer: Medicare Other

## 2013-02-03 ENCOUNTER — Encounter (HOSPITAL_COMMUNITY): Payer: Self-pay | Admitting: Emergency Medicine

## 2013-02-03 DIAGNOSIS — E1140 Type 2 diabetes mellitus with diabetic neuropathy, unspecified: Secondary | ICD-10-CM

## 2013-02-03 DIAGNOSIS — R0789 Other chest pain: Secondary | ICD-10-CM

## 2013-02-03 DIAGNOSIS — M1990 Unspecified osteoarthritis, unspecified site: Secondary | ICD-10-CM

## 2013-02-03 DIAGNOSIS — Z7901 Long term (current) use of anticoagulants: Secondary | ICD-10-CM

## 2013-02-03 DIAGNOSIS — Z8249 Family history of ischemic heart disease and other diseases of the circulatory system: Secondary | ICD-10-CM

## 2013-02-03 DIAGNOSIS — Z6841 Body Mass Index (BMI) 40.0 and over, adult: Secondary | ICD-10-CM

## 2013-02-03 DIAGNOSIS — R29898 Other symptoms and signs involving the musculoskeletal system: Secondary | ICD-10-CM

## 2013-02-03 DIAGNOSIS — C44300 Unspecified malignant neoplasm of skin of unspecified part of face: Secondary | ICD-10-CM

## 2013-02-03 DIAGNOSIS — J36 Peritonsillar abscess: Secondary | ICD-10-CM

## 2013-02-03 DIAGNOSIS — Z7982 Long term (current) use of aspirin: Secondary | ICD-10-CM

## 2013-02-03 DIAGNOSIS — E1149 Type 2 diabetes mellitus with other diabetic neurological complication: Secondary | ICD-10-CM | POA: Diagnosis present

## 2013-02-03 DIAGNOSIS — E1165 Type 2 diabetes mellitus with hyperglycemia: Secondary | ICD-10-CM

## 2013-02-03 DIAGNOSIS — N183 Chronic kidney disease, stage 3 unspecified: Secondary | ICD-10-CM

## 2013-02-03 DIAGNOSIS — E785 Hyperlipidemia, unspecified: Secondary | ICD-10-CM

## 2013-02-03 DIAGNOSIS — I6529 Occlusion and stenosis of unspecified carotid artery: Secondary | ICD-10-CM

## 2013-02-03 DIAGNOSIS — Z87891 Personal history of nicotine dependence: Secondary | ICD-10-CM

## 2013-02-03 DIAGNOSIS — E669 Obesity, unspecified: Secondary | ICD-10-CM

## 2013-02-03 DIAGNOSIS — I2510 Atherosclerotic heart disease of native coronary artery without angina pectoris: Secondary | ICD-10-CM

## 2013-02-03 DIAGNOSIS — R609 Edema, unspecified: Secondary | ICD-10-CM

## 2013-02-03 DIAGNOSIS — Z888 Allergy status to other drugs, medicaments and biological substances status: Secondary | ICD-10-CM

## 2013-02-03 DIAGNOSIS — J449 Chronic obstructive pulmonary disease, unspecified: Secondary | ICD-10-CM

## 2013-02-03 DIAGNOSIS — I129 Hypertensive chronic kidney disease with stage 1 through stage 4 chronic kidney disease, or unspecified chronic kidney disease: Secondary | ICD-10-CM | POA: Diagnosis present

## 2013-02-03 DIAGNOSIS — Z880 Allergy status to penicillin: Secondary | ICD-10-CM

## 2013-02-03 DIAGNOSIS — J4489 Other specified chronic obstructive pulmonary disease: Secondary | ICD-10-CM

## 2013-02-03 DIAGNOSIS — G629 Polyneuropathy, unspecified: Secondary | ICD-10-CM

## 2013-02-03 DIAGNOSIS — E349 Endocrine disorder, unspecified: Secondary | ICD-10-CM

## 2013-02-03 DIAGNOSIS — G2581 Restless legs syndrome: Secondary | ICD-10-CM

## 2013-02-03 DIAGNOSIS — R079 Chest pain, unspecified: Principal | ICD-10-CM

## 2013-02-03 DIAGNOSIS — R252 Cramp and spasm: Secondary | ICD-10-CM

## 2013-02-03 DIAGNOSIS — Z9861 Coronary angioplasty status: Secondary | ICD-10-CM

## 2013-02-03 DIAGNOSIS — Z820 Family history of epilepsy and other diseases of the nervous system: Secondary | ICD-10-CM

## 2013-02-03 DIAGNOSIS — I452 Bifascicular block: Secondary | ICD-10-CM | POA: Diagnosis present

## 2013-02-03 DIAGNOSIS — I10 Essential (primary) hypertension: Secondary | ICD-10-CM

## 2013-02-03 DIAGNOSIS — I4891 Unspecified atrial fibrillation: Secondary | ICD-10-CM | POA: Diagnosis present

## 2013-02-03 DIAGNOSIS — I480 Paroxysmal atrial fibrillation: Secondary | ICD-10-CM

## 2013-02-03 DIAGNOSIS — G4700 Insomnia, unspecified: Secondary | ICD-10-CM

## 2013-02-03 DIAGNOSIS — Z885 Allergy status to narcotic agent status: Secondary | ICD-10-CM

## 2013-02-03 DIAGNOSIS — E1142 Type 2 diabetes mellitus with diabetic polyneuropathy: Secondary | ICD-10-CM

## 2013-02-03 DIAGNOSIS — R072 Precordial pain: Secondary | ICD-10-CM

## 2013-02-03 DIAGNOSIS — K219 Gastro-esophageal reflux disease without esophagitis: Secondary | ICD-10-CM

## 2013-02-03 DIAGNOSIS — I119 Hypertensive heart disease without heart failure: Secondary | ICD-10-CM

## 2013-02-03 LAB — CBC WITH DIFFERENTIAL/PLATELET
Basophils Absolute: 0 10*3/uL (ref 0.0–0.1)
Basophils Relative: 0 % (ref 0–1)
Eosinophils Absolute: 0.2 10*3/uL (ref 0.0–0.7)
Eosinophils Relative: 2 % (ref 0–5)
HEMATOCRIT: 44.5 % (ref 39.0–52.0)
HEMOGLOBIN: 15.3 g/dL (ref 13.0–17.0)
LYMPHS PCT: 19 % (ref 12–46)
Lymphs Abs: 1.8 10*3/uL (ref 0.7–4.0)
MCH: 31.8 pg (ref 26.0–34.0)
MCHC: 34.4 g/dL (ref 30.0–36.0)
MCV: 92.5 fL (ref 78.0–100.0)
MONO ABS: 0.8 10*3/uL (ref 0.1–1.0)
Monocytes Relative: 8 % (ref 3–12)
NEUTROS PCT: 71 % (ref 43–77)
Neutro Abs: 7.1 10*3/uL (ref 1.7–7.7)
Platelets: 241 10*3/uL (ref 150–400)
RBC: 4.81 MIL/uL (ref 4.22–5.81)
RDW: 14.1 % (ref 11.5–15.5)
WBC: 10 10*3/uL (ref 4.0–10.5)

## 2013-02-03 LAB — COMPREHENSIVE METABOLIC PANEL
ALBUMIN: 3.3 g/dL — AB (ref 3.5–5.2)
ALK PHOS: 57 U/L (ref 39–117)
ALT: 21 U/L (ref 0–53)
AST: 54 U/L — ABNORMAL HIGH (ref 0–37)
BILIRUBIN TOTAL: 0.4 mg/dL (ref 0.3–1.2)
BUN: 19 mg/dL (ref 6–23)
CHLORIDE: 93 meq/L — AB (ref 96–112)
CO2: 22 meq/L (ref 19–32)
CREATININE: 1.47 mg/dL — AB (ref 0.50–1.35)
Calcium: 10 mg/dL (ref 8.4–10.5)
GFR calc Af Amer: 51 mL/min — ABNORMAL LOW (ref >90)
GFR, EST NON AFRICAN AMERICAN: 44 mL/min — AB (ref >90)
Glucose, Bld: 269 mg/dL — ABNORMAL HIGH (ref 70–99)
POTASSIUM: 3.6 meq/L — AB (ref 3.7–5.3)
Sodium: 132 mEq/L — ABNORMAL LOW (ref 137–147)
Total Protein: 7 g/dL (ref 6.0–8.3)

## 2013-02-03 LAB — D-DIMER, QUANTITATIVE: D-Dimer, Quant: 0.32 ug/mL-FEU (ref 0.00–0.48)

## 2013-02-03 LAB — TROPONIN I: Troponin I: <0.3 ng/mL (ref <0.30)

## 2013-02-03 LAB — PROTIME-INR
INR: 1.32 (ref 0.00–1.49)
Prothrombin Time: 16.1 seconds — ABNORMAL HIGH (ref 11.6–15.2)

## 2013-02-03 LAB — GLUCOSE, CAPILLARY: GLUCOSE-CAPILLARY: 137 mg/dL — AB (ref 70–99)

## 2013-02-03 LAB — PRO B NATRIURETIC PEPTIDE: Pro B Natriuretic peptide (BNP): 351.7 pg/mL (ref 0–450)

## 2013-02-03 MED ORDER — TESTOSTERONE CYPIONATE 200 MG/ML IM SOLN: 200.0000 mg | INTRAMUSCULAR | Status: DC

## 2013-02-03 MED ORDER — NITROGLYCERIN 0.4 MG SL SUBL
0.4000 mg | SUBLINGUAL_TABLET | SUBLINGUAL | Status: DC | PRN
  Filled 2013-02-03: qty 25

## 2013-02-03 MED ORDER — PRAMIPEXOLE DIHYDROCHLORIDE 0.25 MG PO TABS
0.3750 mg | ORAL_TABLET | Freq: Every day | ORAL | Status: DC
  Administered 2013-02-03 – 2013-02-04 (×2): 0.375 mg via ORAL
  Filled 2013-02-03 (×3): qty 1

## 2013-02-03 MED ORDER — METOPROLOL SUCCINATE ER 100 MG PO TB24
100.0000 mg | ORAL_TABLET | Freq: Every day | ORAL | Status: DC
  Administered 2013-02-04 – 2013-02-05 (×2): 100 mg via ORAL
  Filled 2013-02-03 (×2): qty 1

## 2013-02-03 MED ORDER — SODIUM CHLORIDE 0.9 % IJ SOLN: 3.0000 mL | INTRAMUSCULAR | Status: DC | PRN

## 2013-02-03 MED ORDER — SODIUM CHLORIDE 0.9 % IV SOLN: 250.0000 mL | INTRAVENOUS | Status: DC | PRN

## 2013-02-03 MED ORDER — ATORVASTATIN CALCIUM 40 MG PO TABS
40.0000 mg | ORAL_TABLET | Freq: Every day | ORAL | Status: DC
  Administered 2013-02-04: 40 mg via ORAL
  Filled 2013-02-03 (×2): qty 1

## 2013-02-03 MED ORDER — WARFARIN SODIUM 5 MG PO TABS: 5.0000 mg | ORAL_TABLET | Freq: Once | ORAL | Status: DC

## 2013-02-03 MED ORDER — GLIMEPIRIDE 4 MG PO TABS
4.0000 mg | ORAL_TABLET | Freq: Every day | ORAL | Status: DC
  Administered 2013-02-04 – 2013-02-05 (×2): 4 mg via ORAL
  Filled 2013-02-03 (×2): qty 1

## 2013-02-03 MED ORDER — PANTOPRAZOLE SODIUM 40 MG PO TBEC
40.0000 mg | DELAYED_RELEASE_TABLET | Freq: Every day | ORAL | Status: DC
  Administered 2013-02-03 – 2013-02-05 (×3): 40 mg via ORAL
  Filled 2013-02-03 (×3): qty 1

## 2013-02-03 MED ORDER — SODIUM CHLORIDE 0.9 % IJ SOLN
3.0000 mL | Freq: Two times a day (BID) | INTRAMUSCULAR | Status: DC
  Administered 2013-02-03 – 2013-02-05 (×2): 3 mL via INTRAVENOUS

## 2013-02-03 MED ORDER — SODIUM CHLORIDE 0.9 % IJ SOLN: 3.0000 mL | Freq: Two times a day (BID) | INTRAMUSCULAR | Status: DC

## 2013-02-03 MED ORDER — ASPIRIN 81 MG PO CHEW
324.0000 mg | CHEWABLE_TABLET | Freq: Once | ORAL | Status: AC
  Administered 2013-02-03: 324 mg via ORAL
  Filled 2013-02-03: qty 4

## 2013-02-03 MED ORDER — ALUM & MAG HYDROXIDE-SIMETH 200-200-20 MG/5ML PO SUSP: 30.0000 mL | Freq: Four times a day (QID) | ORAL | Status: DC | PRN

## 2013-02-03 MED ORDER — GABAPENTIN 600 MG PO TABS
1200.0000 mg | ORAL_TABLET | Freq: Once | ORAL | Status: AC
  Administered 2013-02-03: 1200 mg via ORAL
  Filled 2013-02-03: qty 2

## 2013-02-03 MED ORDER — AMITRIPTYLINE HCL 10 MG PO TABS
10.0000 mg | ORAL_TABLET | Freq: Every day | ORAL | Status: DC
  Administered 2013-02-03 – 2013-02-04 (×2): 10 mg via ORAL
  Filled 2013-02-03 (×4): qty 1

## 2013-02-03 MED ORDER — ASPIRIN EC 81 MG PO TBEC
81.0000 mg | DELAYED_RELEASE_TABLET | Freq: Every day | ORAL | Status: DC
  Filled 2013-02-03 (×2): qty 1

## 2013-02-03 MED ORDER — INSULIN GLARGINE 100 UNIT/ML ~~LOC~~ SOLN
50.0000 [IU] | Freq: Two times a day (BID) | SUBCUTANEOUS | Status: DC
  Administered 2013-02-03 – 2013-02-05 (×4): 50 [IU] via SUBCUTANEOUS
  Filled 2013-02-03 (×7): qty 0.5

## 2013-02-03 MED ORDER — WARFARIN - PHARMACIST DOSING INPATIENT: Freq: Every day | Status: DC

## 2013-02-03 MED ORDER — WARFARIN SODIUM 10 MG PO TABS
10.0000 mg | ORAL_TABLET | ORAL | Status: AC
  Administered 2013-02-03: 10 mg via ORAL
  Filled 2013-02-03: qty 1

## 2013-02-03 NOTE — Telephone Encounter (Signed)
Pt's wife called stating that she is concerned about pt. He is pale and has been sick all morning. Pt's bg was 161 this am, ate breakfast late due to someone coming over. 2 hours after bg was 285, next time was 264. She states pt has been in the bathroom, sick; can't hardly lift his arm and pain all across his chest. Advised wife to contact PCP, pt needs to go to the hospital. She quickly said she would and hung up.

## 2013-02-03 NOTE — Telephone Encounter (Signed)
Patient Information:  Caller Name: Inez Catalina  Phone: [phone]  Patient: Gary Yates, Gary Yates  Gender: Male  DOB: 12/01/1933  Age: 80 Years  PCP: Carolann Littler [facility])  Office Follow Up:  Does the office need to follow up with this patient?: No  Instructions For The Office: N/A  RN Note:  Body feels like shaking all over. Mild shortness of breath. Agreed to call 911 now.  Symptoms  Reason For Call & Symptoms: Weakness, mild diarrhea (2 stools) and chest pain; chest pain worsens with deep breaths.  FBS 161, bloods sugar 285 2 hrs pc breakfast.  Blood sugar 264 before lunch. Cardiac cath 01/27/13 with stent placement.  Skin is clammy. nauseated but no vomiting.  Reviewed Health History In EMR: Yes  Reviewed Medications In EMR: Yes  Reviewed Allergies In EMR: Yes  Reviewed Surgeries / Procedures: Yes  Date of Onset of Symptoms: 02/03/2013  Guideline(s) Used:  Chest Pain  Disposition Per Guideline:   Call EMS 911 Now  Reason For Disposition Reached:   Chest pain lasting longer than 5 minutes and ANY of the following:  Over 46 years old Over 86 years old and at least one cardiac risk factor (i.e., high blood pressure, diabetes, high cholesterol, obesity, smoker or strong family history of heart disease) Pain is crushing, pressure-like, or heavy  Took nitroglycerin and chest pain was not relieved History of heart disease (i.e., angina, heart attack, bypass surgery, angioplasty, CHF)  Advice Given:  N/A  Patient Will Follow Care Advice:  YES

## 2013-02-03 NOTE — H&P (Signed)
PCP:   [name], MD   Chief Complaint:  cp  HPI: 80 yo male with h/o dm, htn, cad, copd s/p cath last week showing nonobs cad with patent stents and moderate disease in LAD unchanged from prev cath comes in with sscp that has lasted all day long since after eating his breakfast around 11 am.  The pain associated with nausea no vomiting.  Pt just received some asA alone in ED and relieved his pain about 30 min ago.  No le edema or swelling.  Has gerd but usually doesn't feel like this.  No fevers.  No cough.  Feels back to normal now.  Review of Systems:  Positive and negative as per HPI otherwise all other systems are negative  Past Medical History: Past Medical History  Diagnosis Date  . Coronary artery disease     a. h/o Overlapping stents RCA;  b. 06/2011 Cath: patent stents, nonobs dzs, NL EF.  Marland Kitchen Hypertension   . Osteoarthritis     shoulder  . Restless leg   . Hyperlipidemia   . COPD (chronic obstructive pulmonary disease)   . Nephrolithiasis   . SVT (supraventricular tachycardia)   . Dyspnea   . GERD (gastroesophageal reflux disease)   . DM (diabetes mellitus)     Type 2, peripheral neuropathy.  . Diabetic peripheral neuropathy   . Atrial fibrillation   . Low testosterone    Past Surgical History  Procedure Laterality Date  . Cholecystectomy    . Cardiac catheterization  01/2013    Medications: Prior to Admission medications   Medication Sig Start Date End Date Taking? Authorizing Provider  amitriptyline (ELAVIL) 10 MG tablet Take 1 tablet (10 mg total) by mouth at bedtime. 01/12/13  Yes [name], MD  aspirin 81 MG tablet Take 81 mg by mouth daily.     Yes Historical Provider, MD  atorvastatin (LIPITOR) 40 MG tablet Take 1 tablet (40 mg total) by mouth daily. 10/26/12  Yes [name], MD  Calcium Carbonate-Vitamin D (CALCIUM + D PO) Take 1 tablet by mouth daily.    Yes Historical Provider, MD  fenofibrate micronized (LOFIBRA) 134 MG capsule  Take 1 capsule by mouth  daily before breakfast 05/14/12  Yes [name], MD  gabapentin (NEURONTIN) 300 MG capsule 600 mg at breakfast and lunch and 1200 mg at night 02/02/13  Yes Philemon Kingdom, MD  glimepiride (AMARYL) 4 MG tablet Take 4 mg by mouth daily. 01/08/13  Yes Historical Provider, MD  insulin aspart (NOVOLOG FLEXPEN) 100 UNIT/ML FlexPen Inject under skin up to 62 units daily 02/02/13  Yes Philemon Kingdom, MD  insulin glargine (LANTUS) 100 UNIT/ML injection Inject 50 Units into the skin 2 (two) times daily.   Yes Historical Provider, MD  metoprolol succinate (TOPROL-XL) 100 MG 24 hr tablet Take 1 tablet by mouth  every day immediately  following a meal 05/14/12  Yes [name], MD  omeprazole (PRILOSEC) 20 MG capsule Take 1 capsule by mouth  daily 05/14/12  Yes [name], MD  pramipexole (MIRAPEX) 0.125 MG tablet Take 0.375 mg by mouth at bedtime. Take 3 tablets at night. 12/09/12  Yes [name], MD  testosterone cypionate (DEPO-TESTOSTERONE) 200 MG/ML injection Inject 1 mL (200 mg total) into the muscle every 28 (twenty-eight) days. 01/21/13  Yes [name], MD  warfarin (COUMADIN) 5 MG tablet Take 5 mg by mouth one time only at 6 PM. Pt takes 7.5 mg on wednesdays  and 5 mg on all other days 12/15/12  Yes [name], MD  nitroGLYCERIN (NITROSTAT) 0.4 MG SL tablet Place 0.4 mg under the tongue every 5 (five) minutes as needed. Chest pain    Historical Provider, MD    Allergies:   Allergies  Allergen Reactions  . Ace Inhibitors Other (See Comments)    cough  . Codeine Nausea Only and Rash       . Penicillins Rash    Social History:  reports that he quit smoking about 29 years ago. His smoking use included Cigarettes. He has a 30 pack-year smoking history. He has never used smokeless tobacco. He reports that he does not drink alcohol or use illicit drugs.  Family History: Family History  Problem Relation Age of Onset  . Coronary artery disease       Male 1st degree relative <50  . Coronary artery disease      male 1st degree relative <60  . Heart disease Father   . Migraines Father   . Ulcers Father   . Alzheimer's disease Mother   . Heart disease Mother   . Heart disease Sister   . Obesity Sister     Morbid  . Arthritis Sister   . Heart disease Brother   . Arthritis Brother   . Sleep apnea Son   . Obesity Son   . Migraines Daughter   . Thyroid disease Daughter     Physical Exam: Filed Vitals:   02/03/13 1715 02/03/13 1830  BP: 115/60 110/68  Pulse: 104 94  Temp: 98.9 F (37.2 C)   TempSrc: Oral   Resp: 16 18  SpO2: 97% 92%   General appearance: alert, cooperative and no distress Head: Normocephalic, without obvious abnormality, atraumatic Eyes: negative Nose: Nares normal. Septum midline. Mucosa normal. No drainage or sinus tenderness. Neck: no JVD and supple, symmetrical, trachea midline Lungs: clear to auscultation bilaterally Heart: regular rate and rhythm, S1, S2 normal, no murmur, click, rub or gallop Abdomen: soft, non-tender; bowel sounds normal; no masses,  no organomegaly Extremities: extremities normal, atraumatic, no cyanosis or edema Pulses: 2+ and symmetric Skin: Skin color, texture, turgor normal. No rashes or lesions Neurologic: Grossly normal  Labs on Admission:   Recent Labs  02/03/13 1724  NA 132*  K 3.6*  CL 93*  CO2 22  GLUCOSE 269*  BUN 19  CREATININE 1.47*  CALCIUM 10.0    Recent Labs  02/03/13 1724  AST 54*  ALT 21  ALKPHOS 57  BILITOT 0.4  PROT 7.0  ALBUMIN 3.3*    Recent Labs  02/03/13 1724  WBC 10.0  NEUTROABS 7.1  HGB 15.3  HCT 44.5  MCV 92.5  PLT 241    Recent Labs  02/03/13 1724  TROPONINI <0.30    Radiological Exams on Admission: Dg Chest 2 View  02/03/2013   CLINICAL DATA:  Chest pain and shortness of breath.  EXAM: CHEST  2 VIEW  COMPARISON:  05/14/2006  FINDINGS: Lungs are adequately inflated with mild symmetric hazy density over  the bases not seen on the lateral film likely due to overlying soft tissues and lordotic technique. There is stable cardiomegaly. Remainder the exam is unchanged.  IMPRESSION: No active cardiopulmonary disease.  Cardiomegaly.   Electronically Signed   By: Marin Olp M.D.   On: 02/03/2013 18:24    Assessment/Plan  80 yo male with atypical cp with recent LHC showing patent stents and moderate disease in LAD  Principal Problem:  Midsternal chest pain-  Cardiology service recommends to obs for serial enzymes, do not think this is cardiac in nature.  ekg no acute changes thus far.    Active Problems:   Morbid obesity   HYPERTENSION   Paroxysmal atrial fibrillation   Poorly controlled type 2 diabetes mellitus with neuropathy   Chronic kidney disease, stage III (moderate)    Kellan Raffield A 02/03/2013, 8:15 PM

## 2013-02-03 NOTE — ED Provider Notes (Signed)
CSN: [id number]     Arrival date & time 02/03/13  1708 History   First MD Initiated Contact with Patient 02/03/13 1712     No chief complaint on file.  (Consider location/radiation/quality/duration/timing/severity/associated sxs/prior Treatment) HPI Comments: Patient presents to the for evaluation of chest pain and shortness of breath. Patient reports that he has been experiencing shortness of breath for some time. He underwent cardiac catheterization last week which showed nonobstructive disease and it was felt that the shortness of breath was not secondary to his heart. Patient reports that his blood sugar has been poorly controlled gabapentin endocrinologist yesterday who added NovoLog insulin to his Lantus regimen. He took a small dose last night, then took a dose this morning. He reports that the chest pain started shortly after taking the NovoLog insulin and is concerned that this caused pain. He reports a continuous pain across his chest which worsens when he takes a deep breath. He cannot quantify or qualify it.   Past Medical History  Diagnosis Date  . Coronary artery disease     a. h/o Overlapping stents RCA;  b. 06/2011 Cath: patent stents, nonobs dzs, NL EF.  Marland Kitchen Hypertension   . Osteoarthritis     shoulder  . Restless leg   . Hyperlipidemia   . COPD (chronic obstructive pulmonary disease)   . Nephrolithiasis   . SVT (supraventricular tachycardia)   . Dyspnea   . GERD (gastroesophageal reflux disease)   . DM (diabetes mellitus)     Type 2, peripheral neuropathy.  . Diabetic peripheral neuropathy   . Atrial fibrillation   . Low testosterone    Past Surgical History  Procedure Laterality Date  . Cholecystectomy    . Cardiac catheterization  01/2013   Family History  Problem Relation Age of Onset  . Coronary artery disease      Male 1st degree relative <50  . Coronary artery disease      male 1st degree relative <60  . Heart disease Father   . Migraines Father   .  Ulcers Father   . Alzheimer's disease Mother   . Heart disease Mother   . Heart disease Sister   . Obesity Sister     Morbid  . Arthritis Sister   . Heart disease Brother   . Arthritis Brother   . Sleep apnea Son   . Obesity Son   . Migraines Daughter   . Thyroid disease Daughter    History  Substance Use Topics  . Smoking status: Former Smoker -- 1.50 packs/day for 20 years    Types: Cigarettes    Quit date: 04/05/1983  . Smokeless tobacco: Never Used  . Alcohol Use: No    Review of Systems  Respiratory: Positive for shortness of breath.   Cardiovascular: Positive for chest pain.  All other systems reviewed and are negative.    Allergies  Ace inhibitors; Codeine; and Penicillins  Home Medications   Current Outpatient Rx  Name  Route  Sig  Dispense  Refill  . ACCU-CHEK AVIVA PLUS test strip      TEST 3 TIMES A DAY   100 strip   11   . amitriptyline (ELAVIL) 10 MG tablet   Oral   Take 1 tablet (10 mg total) by mouth at bedtime.   30 tablet   2   . aspirin 81 MG tablet   Oral   Take 81 mg by mouth daily.           Marland Kitchen  atorvastatin (LIPITOR) 40 MG tablet   Oral   Take 1 tablet (40 mg total) by mouth daily.   90 tablet   3   . Calcium Carbonate-Vitamin D (CALCIUM + D PO)   Oral   Take 1 tablet by mouth daily.          . fenofibrate micronized (LOFIBRA) 134 MG capsule      Take 1 capsule by mouth  daily before breakfast   90 capsule   3   . gabapentin (NEURONTIN) 300 MG capsule      600 mg at breakfast and lunch and 1200 mg at night   300 capsule   11   . glucose blood (ACCU-CHEK AVIVA PLUS) test strip      Test BS 3 times daily   100 each   6   . insulin aspart (NOVOLOG FLEXPEN) 100 UNIT/ML FlexPen      Inject under skin up to 62 units daily   15 mL   11   . Insulin Glargine (LANTUS) 100 UNIT/ML Solostar Pen   Subcutaneous   Inject 50 Units into the skin 2 (two) times daily. INJECT 56 UNITS INTO THE SKIN 2 (TWO) TIMES DAILY.   15  mL   11   . Insulin Pen Needle (FIFTY50 PEN NEEDLES) 31G X 5 MM MISC      Use 5x a day   300 each   prn   . ipratropium (ATROVENT) 0.03 % nasal spray   Nasal   Place 2 sprays into the nose every 12 (twelve) hours.   30 mL   1   . Lancets (ACCU-CHEK MULTICLIX) lancets      Use twice daily as instructed   204 each   3   . metoprolol succinate (TOPROL-XL) 100 MG 24 hr tablet      Take 1 tablet by mouth  every day immediately  following a meal   90 tablet   3   . nitroGLYCERIN (NITROSTAT) 0.4 MG SL tablet   Sublingual   Place 0.4 mg under the tongue every 5 (five) minutes as needed. Chest pain         . omeprazole (PRILOSEC) 20 MG capsule      Take 1 capsule by mouth  daily   90 capsule   3   . pramipexole (MIRAPEX) 0.125 MG tablet   Oral   Take 0.375 mg by mouth at bedtime. Take 3 tablets at night.         . testosterone cypionate (DEPO-TESTOSTERONE) 200 MG/ML injection   Intramuscular   Inject 1 mL (200 mg total) into the muscle every 28 (twenty-eight) days.   10 mL   0   . tiotropium (SPIRIVA) 18 MCG inhalation capsule   Inhalation   Place 18 mcg into inhaler and inhale daily.         Marland Kitchen warfarin (COUMADIN) 5 MG tablet   Oral   Take 5 mg by mouth one time only at 6 PM. Take as directed by anticoagulation clinic          BP 115/60  Pulse 104  Temp(Src) 98.9 F (37.2 C) (Oral)  Resp 16  SpO2 97% Physical Exam  Constitutional: He is oriented to person, place, and time. He appears well-developed and well-nourished. No distress.  HENT:  Head: Normocephalic and atraumatic.  Right Ear: Hearing normal.  Left Ear: Hearing normal.  Nose: Nose normal.  Mouth/Throat: Oropharynx is clear and moist and mucous membranes are normal.  Eyes: Conjunctivae and EOM are normal. Pupils are equal, round, and reactive to light.  Neck: Normal range of motion. Neck supple.  Cardiovascular: Regular rhythm, S1 normal and S2 normal.  Exam reveals no gallop and no  friction rub.   No murmur heard. Pulmonary/Chest: Effort normal and breath sounds normal. No respiratory distress. He exhibits no tenderness.  Abdominal: Soft. Normal appearance and bowel sounds are normal. There is no hepatosplenomegaly. There is no tenderness. There is no rebound, no guarding, no tenderness at McBurney's point and negative Murphy's sign. No hernia.  Musculoskeletal: Normal range of motion. He exhibits edema (trace pitting).  Neurological: He is alert and oriented to person, place, and time. He has normal strength. No cranial nerve deficit or sensory deficit. Coordination normal. GCS eye subscore is 4. GCS verbal subscore is 5. GCS motor subscore is 6.  Skin: Skin is warm, dry and intact. No rash noted. No cyanosis.  Psychiatric: He has a normal mood and affect. His speech is normal and behavior is normal. Thought content normal.    ED Course  Procedures (including critical care time) Labs Review Labs Reviewed  CBC WITH DIFFERENTIAL  COMPREHENSIVE METABOLIC PANEL  PRO B NATRIURETIC PEPTIDE  TROPONIN I  D-DIMER, QUANTITATIVE   Imaging Review No results found.  EKG Interpretation    Date/Time:  Wednesday February 03 2013 17:12:52 EST Ventricular Rate:  104 PR Interval:  183 QRS Duration: 160 QT Interval:  391 QTC Calculation: 514 R Axis:   -92 Text Interpretation:  Age not entered, assumed to be  80 years old for purpose of ECG interpretation Sinus tachycardia RBBB and LAFB No significant change since last tracing Confirmed by POLLINA  MD, [place] ([id number]) on 02/03/2013 5:23:37 PM            MDM  Diagnosis: 1. Chest pain 2. Uncontrolled Diabetes  Presents to the ER for evaluation of chest pain. Patient has a history of coronary artery disease and uncontrolled diabetes. His records however, because he just had a cardiac catheterization that was felt to be non-obstructive. Patient's blood sugars mildly elevated, specifically because he did not take NovoLog  he was recently placed on. He didn't take his medicine because he thinks it is causing his chest pain and him to feel poorly.  Assessment was unchanged. First troponin was negative.  Case was discussed with Dr. Marlou Porch, for cardiology. Dr. Ramiro Harvest that since the patient just had a cardiac catheterization, he would not require any further cardiac interventions. He recommended admitting the patient to medicine, cycling enzymes. Troponins are negative, no further cardiac imaging would be required.    Orpah Greek, MD 02/03/13 [phone]

## 2013-02-03 NOTE — Progress Notes (Signed)
ANTICOAGULATION CONSULT NOTE - Initial Consult  Pharmacy Consult for Coumadin Indication: atrial fibrillation  Allergies  Allergen Reactions  . Ace Inhibitors Other (See Comments)    cough  . Codeine Nausea Only and Rash       . Penicillins Rash    Vital Signs: Temp: 98.9 F (37.2 C) (01/28 1715) Temp src: Oral (01/28 1715) BP: 124/68 mmHg (01/28 2100) Pulse Rate: 87 (01/28 2100)  Labs:  Recent Labs  02/03/13 1724  HGB 15.3  HCT 44.5  PLT 241  LABPROT 16.1*  INR 1.32  CREATININE 1.47*  TROPONINI <0.30    The CrCl is unknown because both a height and weight (above a minimum accepted value) are required for this calculation.   Medical History: Past Medical History  Diagnosis Date  . Coronary artery disease     a. h/o Overlapping stents RCA;  b. 06/2011 Cath: patent stents, nonobs dzs, NL EF.  Marland Kitchen Hypertension   . Osteoarthritis     shoulder  . Restless leg   . Hyperlipidemia   . COPD (chronic obstructive pulmonary disease)   . Nephrolithiasis   . SVT (supraventricular tachycardia)   . Dyspnea   . GERD (gastroesophageal reflux disease)   . DM (diabetes mellitus)     Type 2, peripheral neuropathy.  . Diabetic peripheral neuropathy   . Atrial fibrillation   . Low testosterone     Assessment: 80 year old male on Coumadin PTA for Afib Admit INR = 1.32 Dose PTA = 5 mg daily, except 7.5 mg on Wednesdays ?Taking Coumadin PTA  Goal of Therapy:  INR 2-3 Monitor platelets by anticoagulation protocol: Yes   Plan:  1) Coumadin 10 mg po x 1 2) Daily INR  Thank you. Anette Guarneri, PharmD [phone]  02/03/2013,9:38 PM

## 2013-02-03 NOTE — ED Notes (Signed)
PT arrived via EMS with complaint of intermittent chest pain and "not feeling well" after taking new insulin. EMS reports that PT had diarrhea earlier this week and family at home has stomach bug. PT had runs of SVT and VTAC en route per EMS, but NS currently. 20G IV L AC. PT denies pain and SOB. Hx of stents, CBG en route 282. PT allergies to PCX, codeine, ACEi.

## 2013-02-04 ENCOUNTER — Inpatient Hospital Stay (HOSPITAL_COMMUNITY): Payer: Medicare Other

## 2013-02-04 ENCOUNTER — Encounter (HOSPITAL_COMMUNITY): Payer: Self-pay | Admitting: General Practice

## 2013-02-04 DIAGNOSIS — E785 Hyperlipidemia, unspecified: Secondary | ICD-10-CM

## 2013-02-04 DIAGNOSIS — E669 Obesity, unspecified: Secondary | ICD-10-CM

## 2013-02-04 DIAGNOSIS — J449 Chronic obstructive pulmonary disease, unspecified: Secondary | ICD-10-CM

## 2013-02-04 LAB — PROTIME-INR
INR: 1.32 (ref 0.00–1.49)
PROTHROMBIN TIME: 16.1 s — AB (ref 11.6–15.2)

## 2013-02-04 LAB — HEPARIN LEVEL (UNFRACTIONATED)

## 2013-02-04 LAB — GLUCOSE, CAPILLARY
GLUCOSE-CAPILLARY: 231 mg/dL — AB (ref 70–99)
GLUCOSE-CAPILLARY: 174 mg/dL — AB (ref 70–99)
Glucose-Capillary: 183 mg/dL — ABNORMAL HIGH (ref 70–99)
Glucose-Capillary: 222 mg/dL — ABNORMAL HIGH (ref 70–99)

## 2013-02-04 LAB — TROPONIN I
TROPONIN I: 0.48 ng/mL — AB (ref <0.30)
TROPONIN I: 0.46 ng/mL — AB (ref <0.30)
Troponin I: 0.44 ng/mL (ref <0.30)
Troponin I: 0.64 ng/mL (ref <0.30)
Troponin I: 0.34 ng/mL (ref <0.30)

## 2013-02-04 MED ORDER — GABAPENTIN 600 MG PO TABS
1200.0000 mg | ORAL_TABLET | Freq: Every evening | ORAL | Status: DC
  Filled 2013-02-04: qty 2

## 2013-02-04 MED ORDER — IOHEXOL 350 MG/ML SOLN
100.0000 mL | Freq: Once | INTRAVENOUS | Status: AC | PRN
  Administered 2013-02-04: 100 mL via INTRAVENOUS

## 2013-02-04 MED ORDER — GABAPENTIN 600 MG PO TABS
600.0000 mg | ORAL_TABLET | Freq: Every day | ORAL | Status: DC
  Filled 2013-02-04: qty 1

## 2013-02-04 MED ORDER — GABAPENTIN 600 MG PO TABS
600.0000 mg | ORAL_TABLET | Freq: Every day | ORAL | Status: DC
  Administered 2013-02-04: 600 mg via ORAL
  Filled 2013-02-04 (×2): qty 1

## 2013-02-04 MED ORDER — CLOPIDOGREL BISULFATE 75 MG PO TABS
75.0000 mg | ORAL_TABLET | Freq: Every day | ORAL | Status: DC
  Administered 2013-02-04 – 2013-02-05 (×2): 75 mg via ORAL
  Filled 2013-02-04 (×2): qty 1

## 2013-02-04 MED ORDER — INSULIN ASPART 100 UNIT/ML ~~LOC~~ SOLN
6.0000 [IU] | Freq: Three times a day (TID) | SUBCUTANEOUS | Status: DC
  Administered 2013-02-04 – 2013-02-05 (×2): 6 [IU] via SUBCUTANEOUS

## 2013-02-04 MED ORDER — GABAPENTIN 600 MG PO TABS
1200.0000 mg | ORAL_TABLET | Freq: Every day | ORAL | Status: DC
  Administered 2013-02-04: 1200 mg via ORAL
  Filled 2013-02-04 (×2): qty 2

## 2013-02-04 MED ORDER — HEPARIN BOLUS VIA INFUSION
2000.0000 [IU] | Freq: Once | INTRAVENOUS | Status: AC
  Administered 2013-02-04: 2000 [IU] via INTRAVENOUS
  Filled 2013-02-04: qty 2000

## 2013-02-04 MED ORDER — INSULIN ASPART 100 UNIT/ML ~~LOC~~ SOLN
0.0000 [IU] | Freq: Three times a day (TID) | SUBCUTANEOUS | Status: DC
  Administered 2013-02-04: 7 [IU] via SUBCUTANEOUS
  Administered 2013-02-05: 4 [IU] via SUBCUTANEOUS

## 2013-02-04 MED ORDER — WARFARIN SODIUM 7.5 MG PO TABS
7.5000 mg | ORAL_TABLET | Freq: Once | ORAL | Status: AC
  Administered 2013-02-04: 7.5 mg via ORAL
  Filled 2013-02-04: qty 1

## 2013-02-04 MED ORDER — ACETAMINOPHEN 325 MG PO TABS
650.0000 mg | ORAL_TABLET | ORAL | Status: DC | PRN
Start: 2013-02-04 — End: 2013-02-05
  Administered 2013-02-04: 650 mg via ORAL
  Filled 2013-02-04: qty 2

## 2013-02-04 MED ORDER — ISOSORBIDE MONONITRATE ER 30 MG PO TB24
30.0000 mg | ORAL_TABLET | Freq: Every day | ORAL | Status: DC
  Administered 2013-02-04 – 2013-02-05 (×2): 30 mg via ORAL
  Filled 2013-02-04 (×3): qty 1

## 2013-02-04 MED ORDER — GABAPENTIN 600 MG PO TABS
600.0000 mg | ORAL_TABLET | Freq: Every day | ORAL | Status: DC
Start: 2013-02-05 — End: 2013-02-05
  Administered 2013-02-05: 600 mg via ORAL

## 2013-02-04 MED ORDER — GABAPENTIN 300 MG PO CAPS
600.0000 mg | ORAL_CAPSULE | Freq: Every day | ORAL | Status: DC
  Filled 2013-02-04: qty 2

## 2013-02-04 MED ORDER — SALINE SPRAY 0.65 % NA SOLN
1.0000 | NASAL | Status: DC | PRN
  Administered 2013-02-04: 1 via NASAL
  Filled 2013-02-04 (×2): qty 44

## 2013-02-04 MED ORDER — CLOPIDOGREL BISULFATE 75 MG PO TABS
75.0000 mg | ORAL_TABLET | Freq: Every day | ORAL | Status: DC
Start: 2013-02-05 — End: 2013-02-04

## 2013-02-04 MED ORDER — HEPARIN (PORCINE) IN NACL 100-0.45 UNIT/ML-% IJ SOLN
1450.0000 [IU]/h | INTRAMUSCULAR | Status: DC
  Administered 2013-02-04: 1450 [IU]/h via INTRAVENOUS
  Filled 2013-02-04: qty 250

## 2013-02-04 NOTE — Telephone Encounter (Signed)
Agree with plan 

## 2013-02-04 NOTE — Progress Notes (Signed)
Long discussion with pt regarding his chest pain, slight elevation of troponin. May represent small vessel disease. Will start Imdur 30 mg po Qdaily.   [name],CHRISTOPHER 4:14 PM 02/04/2013

## 2013-02-04 NOTE — Consult Note (Signed)
CONSULT NOTE  Date: 02/04/2013               Patient Name:  Gary Yates. MRN: [id number]  DOB: [birth year] Age / Sex: 80 y.o., male        PCP: [name] Primary Cardiologist: Angelena Form            Referring Physician: Isaac Bliss, MD              Reason for Consult:  continued CP, hx of CAD, DM           History of Present Illness: Patient is a 80 y.o. male with a PMHx of coronary artery disease status post stenting in the past, poorly controlled diabetes mellitus, obesity, COPD, hyperlipidemia, hypertension, paroxysmal atrial fibrillation, who was admitted to [facility] on 02/03/2013 for evaluation of recurrent chest pain.  Gary Yates is a 80 year old gentleman with history of coronary artery disease and very poorly controlled diabetes mellitus. His last hemoglobin A1c is around 11. Has a history of coronary stenting of the right coronary artery. He had a cardiac catheterization one week ago which revealed patent stents and revealed moderate coronary artery disease elsewhere.   He presented to the hospital yesterday after developing chest pain while eating cereal. The chest pain was described as pleuritic. He definitely describe it as a sharp, knifelike chest pain that worsened with a deep breath. He does not get any regular exercise although he looks forward to joining  the senior center soon and doing some water aerobics soon.  Overnight his troponin levels have become mildly elevated. His EKG shows normal sinus rhythm. He has a right bundle branch block and left anterior fascicular block. There are no acute ST or T wave changes. ` Medications: Outpatient medications: Prescriptions prior to admission  Medication Sig Dispense Refill  . amitriptyline (ELAVIL) 10 MG tablet Take 1 tablet (10 mg total) by mouth at bedtime.  30 tablet  2  . aspirin 81 MG tablet Take 81 mg by mouth daily.        Marland Kitchen atorvastatin (LIPITOR) 40 MG tablet Take 1 tablet (40 mg total) by mouth daily.   90 tablet  3  . Calcium Carbonate-Vitamin D (CALCIUM + D PO) Take 1 tablet by mouth daily.       . fenofibrate micronized (LOFIBRA) 134 MG capsule Take 1 capsule by mouth  daily before breakfast  90 capsule  3  . gabapentin (NEURONTIN) 300 MG capsule 600 mg at breakfast and lunch and 1200 mg at night  300 capsule  11  . glimepiride (AMARYL) 4 MG tablet Take 4 mg by mouth daily.      . insulin aspart (NOVOLOG FLEXPEN) 100 UNIT/ML FlexPen Inject under skin up to 62 units daily  15 mL  11  . insulin glargine (LANTUS) 100 UNIT/ML injection Inject 50 Units into the skin 2 (two) times daily.      . metoprolol succinate (TOPROL-XL) 100 MG 24 hr tablet Take 1 tablet by mouth  every day immediately  following a meal  90 tablet  3  . omeprazole (PRILOSEC) 20 MG capsule Take 1 capsule by mouth  daily  90 capsule  3  . pramipexole (MIRAPEX) 0.125 MG tablet Take 0.375 mg by mouth at bedtime. Take 3 tablets at night.      . testosterone cypionate (DEPO-TESTOSTERONE) 200 MG/ML injection Inject 1 mL (200 mg total) into the muscle every 28 (twenty-eight) days.  10 mL  0  .  warfarin (COUMADIN) 5 MG tablet Take 5 mg by mouth one time only at 6 PM. Pt takes 7.5 mg on wednesdays and 5 mg on all other days      . nitroGLYCERIN (NITROSTAT) 0.4 MG SL tablet Place 0.4 mg under the tongue every 5 (five) minutes as needed. Chest pain        Current medications: Current Facility-Administered Medications  Medication Dose Route Frequency Provider Last Rate Last Dose  . 0.9 %  sodium chloride infusion  250 mL Intravenous PRN Phillips Grout, MD      . alum & mag hydroxide-simeth (MAALOX/MYLANTA) 200-200-20 MG/5ML suspension 30 mL  30 mL Oral Q6H PRN Phillips Grout, MD      . amitriptyline (ELAVIL) tablet 10 mg  10 mg Oral QHS Phillips Grout, MD   10 mg at 02/03/13 2324  . aspirin EC tablet 81 mg  81 mg Oral Daily Phillips Grout, MD      . atorvastatin (LIPITOR) tablet 40 mg  40 mg Oral q1800 Phillips Grout, MD      .  glimepiride (AMARYL) tablet 4 mg  4 mg Oral Daily Phillips Grout, MD      . heparin ADULT infusion 100 units/mL (25000 units/250 mL)  1,450 Units/hr Intravenous Continuous Erline Hau, MD 14.5 mL/hr at 02/04/13 0725 1,450 Units/hr at 02/04/13 0725  . insulin glargine (LANTUS) injection 50 Units  50 Units Subcutaneous BID Phillips Grout, MD   50 Units at 02/03/13 2325  . metoprolol succinate (TOPROL-XL) 24 hr tablet 100 mg  100 mg Oral Daily Rachal A Shanon Brow, MD      . nitroGLYCERIN (NITROSTAT) SL tablet 0.4 mg  0.4 mg Sublingual Q5 min PRN Orpah Greek, MD      . pantoprazole (PROTONIX) EC tablet 40 mg  40 mg Oral Daily Phillips Grout, MD   40 mg at 02/03/13 2324  . pramipexole (MIRAPEX) tablet 0.375 mg  0.375 mg Oral QHS Phillips Grout, MD   0.375 mg at 02/03/13 2324  . sodium chloride (OCEAN) 0.65 % nasal spray 1 spray  1 spray Each Nare PRN Rhetta Mura Schorr, NP   1 spray at 02/04/13 0200  . sodium chloride 0.9 % injection 3 mL  3 mL Intravenous Q12H Phillips Grout, MD      . sodium chloride 0.9 % injection 3 mL  3 mL Intravenous Q12H Phillips Grout, MD   3 mL at 02/03/13 2328  . sodium chloride 0.9 % injection 3 mL  3 mL Intravenous PRN Phillips Grout, MD      . Warfarin - Pharmacist Dosing Inpatient   Does not apply [id number] Phillips Grout, MD       Facility-Administered Medications Ordered in Other Encounters  Medication Dose Route Frequency Provider Last Rate Last Dose  . testosterone cypionate (DEPOTESTOTERONE CYPIONATE) injection 200 mg  200 mg Intramuscular Q28 days [name], MD   200 mg at 12/26/11 0850  . testosterone cypionate (DEPOTESTOTERONE CYPIONATE) injection 200 mg  200 mg Intramuscular Q28 days [name], MD   200 mg at 01/23/12 0908  . testosterone cypionate (DEPOTESTOTERONE CYPIONATE) injection 200 mg  200 mg Intramuscular Q28 days [name], MD   200 mg at 02/25/12 0848  . testosterone cypionate (DEPOTESTOTERONE CYPIONATE)  injection 200 mg  200 mg Intramuscular Q28 days [name], MD   200 mg at 06/03/12 1437  Allergies  Allergen Reactions  . Ace Inhibitors Other (See Comments)    cough  . Codeine Nausea Only and Rash       . Penicillins Rash     Past Medical History  Diagnosis Date  . Coronary artery disease     a. h/o Overlapping stents RCA;  b. 06/2011 Cath: patent stents, nonobs dzs, NL EF.  Marland Kitchen Hypertension   . Osteoarthritis     shoulder  . Restless leg   . Hyperlipidemia   . COPD (chronic obstructive pulmonary disease)   . Nephrolithiasis   . SVT (supraventricular tachycardia)   . Dyspnea   . GERD (gastroesophageal reflux disease)   . DM (diabetes mellitus)     Type 2, peripheral neuropathy.  . Diabetic peripheral neuropathy   . Atrial fibrillation   . Low testosterone     Past Surgical History  Procedure Laterality Date  . Cholecystectomy    . Cardiac catheterization  01/2013    Family History  Problem Relation Age of Onset  . Coronary artery disease      Male 1st degree relative <50  . Coronary artery disease      male 1st degree relative <60  . Heart disease Father   . Migraines Father   . Ulcers Father   . Alzheimer's disease Mother   . Heart disease Mother   . Heart disease Sister   . Obesity Sister     Morbid  . Arthritis Sister   . Heart disease Brother   . Arthritis Brother   . Sleep apnea Son   . Obesity Son   . Migraines Daughter   . Thyroid disease Daughter     Social History:  reports that he quit smoking about 29 years ago. His smoking use included Cigarettes. He has a 30 pack-year smoking history. He has never used smokeless tobacco. He reports that he does not drink alcohol or use illicit drugs.   Review of Systems: Constitutional:  denies fever, chills, diaphoresis, appetite change and fatigue.  HEENT: denies photophobia, eye pain, redness, hearing loss, ear pain, congestion, sore throat, rhinorrhea, sneezing, neck pain, neck stiffness  and tinnitus.  Respiratory: admits to SOB, DOE, cough, chest tightness, and wheezing.  Cardiovascular: admits to chest pain, palpitations and leg swelling.  Gastrointestinal: denies nausea, vomiting, abdominal pain, diarrhea, constipation, blood in stool.  Genitourinary: denies dysuria, urgency, frequency, hematuria, flank pain and difficulty urinating.  Musculoskeletal: denies  myalgias, back pain, joint swelling, arthralgias and gait problem.   Skin: denies pallor, rash and wound.  Neurological: denies dizziness, seizures, syncope, weakness, light-headedness, numbness and headaches.   Hematological: denies adenopathy, easy bruising, personal or family bleeding history.  Psychiatric/ Behavioral: denies suicidal ideation, mood changes, confusion, nervousness, sleep disturbance and agitation.    Physical Exam: BP 122/62  Pulse 80  Temp(Src) 97.4 F (36.3 C) (Oral)  Resp 18  Ht 5\' 9"  (1.753 m)  Wt 269 lb 12.8 oz (122.38 kg)  BMI 39.82 kg/m2  SpO2 96%  General: Vital signs reviewed and noted. Well-developed, well-nourished, in no acute distress; alert, he is moderately obese  Head: Normocephalic, atraumatic, sclera anicteric,   Neck: Supple. Negative for carotid bruits. No JVD   Lungs:  Clear bilaterally, no  wheezes, rales, or rhonchi. Breathing is normal   Heart: RRR with S1 S2. No murmurs, rubs, or gallops   Abdomen:  Soft, non-tender, non-distended with normoactive bowel sounds. No hepatomegaly. No rebound/guarding. No obvious abdominal masses   MSK: Strength and the  appear normal for age.   Extremities: No clubbing or cyanosis. Mild  edema.  Distal pedal pulses are 2+ and equal bilaterally.  Neurologic: Alert and oriented X 3. Moves all extremities spontaneously.  Psych: Responds to questions appropriately with a normal affect.     Lab results: Basic Metabolic Panel:  Recent Labs Lab 02/03/13 1724  NA 132*  K 3.6*  CL 93*  CO2 22  GLUCOSE 269*  BUN 19  CREATININE  1.47*  CALCIUM 10.0    Liver Function Tests:  Recent Labs Lab 02/03/13 1724  AST 54*  ALT 21  ALKPHOS 57  BILITOT 0.4  PROT 7.0  ALBUMIN 3.3*   No results found for this basename: LIPASE, AMYLASE,  in the last 168 hours No results found for this basename: AMMONIA,  in the last 168 hours  CBC:  Recent Labs Lab 02/03/13 1724  WBC 10.0  NEUTROABS 7.1  HGB 15.3  HCT 44.5  MCV 92.5  PLT 241    Cardiac Enzymes:  Recent Labs Lab 02/03/13 1724 02/03/13 2321 02/04/13 0431  TROPONINI <0.30 0.44* 0.64*    BNP: No components found with this basename: POCBNP,   CBG:  Recent Labs Lab 02/03/13 2058 02/04/13 0755  GLUCAP 137* 183*    Coagulation Studies:  Recent Labs  02/03/13 1724 02/04/13 0431  LABPROT 16.1* 16.1*  INR 1.32 1.32     Other results: EKG : 02/03/13 :  NSR , RBBB with LAHB,  No acute changed Repeat ECG ordered for today   Imaging: Dg Chest 2 View  02/03/2013   CLINICAL DATA:  Chest pain and shortness of breath.  EXAM: CHEST  2 VIEW  COMPARISON:  05/14/2006  FINDINGS: Lungs are adequately inflated with mild symmetric hazy density over the bases not seen on the lateral film likely due to overlying soft tissues and lordotic technique. There is stable cardiomegaly. Remainder the exam is unchanged.  IMPRESSION: No active cardiopulmonary disease.  Cardiomegaly.   Electronically Signed   By: Marin Olp M.D.   On: 02/03/2013 18:24      Last Echo    Last Cath  Jan. 21, 2015: Left main: No obstructive disease.  Left Anterior Descending Artery: Large caliber vessel in the proximal segment with trifurcation in the mid segment into a large septal perforating branch, moderate caliber bifurcating diagonal branch and small to moderate caliber continuation of the LAD. There appears to be a 40-50% stenosis at the trifurcation in the mid LAD. The diagonal branch bifurcates. The superior branch of the diagonal after the bifurcation is small in caliber  with 70% stenosis.  Circumflex Artery: Large caliber vessel with large caliber obtuse marginal branch. The proximal and mid vessel has diffuse 30% stenosis, unchanged. The obtuse marginal branch has diffuse 30% stenosis in the proximal segment of the vessel.  Right Coronary Artery: Large caliber dominant vessel with patent proximal and mid stented segment with 20% stent restenosis. Mild plaque in the large caliber PDA.  Left Ventricular Angiogram: Deferred.  Impression:  1. Double vessel CAD with patent stents RCA, moderate disease in the LAD which is unchanged from last cath in 2013.  2. Dyspnea and fatigue likely multifactorial  3. No focal targets for PCI     Assessment & Plan: 1. Chest discomfort: The patient presents with recurrent chest pain. He thinks that the pains are somewhat similar to his previous episodes of angina but by history, these pains are clearly pleuritic. He had a cardiac catheterization last week which  revealed patent stents in the right coronary artery. He has moderate diffuse coronary artery disease but no focal lesions that would explain chest pain at rest.    He has no acute EKG changes. I suspect that he has a pulmonary and was. He has several risk factors for DVT/PE. He is obese and very inactive. While he has been started on Coumadin he has not been therapeutic at any time since last week. He has chronic leg edema.  I discussed the case with Dr. Jerilee Hoh. We'll get a CT angiogram of the lungs .  We can consider repeat catheterization but I think that that would not be useful at this time. Continue current therapy with heparin.  He has very poorly controlled diabetes mellitus and it is quite likely that he has small vessel disease. He may be having angina/ischemia from  microvascular coronary artery disease. We discussed the fact that he will need better glucose control, better lipid control, weight loss to improve his microvascular disease.  2. Diabetes mellitus:  His diabetes is very poorly controlled. His last hemoglobin A1c is 11.3.   3. Atrial fibrillation: The patient is currently in normal sinus rhythm. He's currently on heparin. We'll restart his Coumadin as soon as we noted he's not going to have to have any further interventional procedures.  4. Obesity: I have encouraged him to work on a good diet, exercise, and weight loss program.     Thayer Headings, Brooke Bonito., MD, [facility] 02/04/2013, 8:15 AM

## 2013-02-04 NOTE — Telephone Encounter (Signed)
Patient admitted to the hospital.  Please review and close encounter when done.

## 2013-02-04 NOTE — Progress Notes (Signed)
[place] for Heparin Indication: atrial fibrillation/elevated cardiac markers  Allergies  Allergen Reactions  . Ace Inhibitors Other (See Comments)    cough  . Codeine Nausea Only and Rash       . Penicillins Rash    Vital Signs: Temp: 97.4 F (36.3 C) (01/28 2151) Temp src: Oral (01/28 2151) BP: 118/72 mmHg (01/28 2151) Pulse Rate: 87 (01/28 2151)  Labs:  Recent Labs  02/03/13 1724 02/03/13 2321 02/04/13 0431  HGB 15.3  --   --   HCT 44.5  --   --   PLT 241  --   --   LABPROT 16.1*  --  16.1*  INR 1.32  --  1.32  CREATININE 1.47*  --   --   TROPONINI <0.30 0.44* 0.64*    Estimated Creatinine Clearance: 53.5 ml/min (by C-G formula based on Cr of 1.47).  Assessment: 80 yo male with h/o Afib, subtherapeutic INR, now with elevated troponins, for heparin  Goal of Therapy:  Heparin level 0.3-0.7 Monitor platelets by anticoagulation protocol: Yes   Plan:  Heparin 2000 units IV bolus, then heparin 1450 units/hr Check heparin level in 6 hours.  Phillis Knack, PharmD, BCPS 02/04/2013,6:38 AM

## 2013-02-04 NOTE — Progress Notes (Signed)
CRITICAL VALUE ALERT  Critical value received:  0.44 troponin  Date of notification:  02/04/13  Time of notification:  0034  Critical value read back:yes  Nurse who received alert:  Lyanne Co  MD notified (1st page):  Raliegh Ip Schoor  Time of first page:  0036  MD notified (2nd page):  Time of second page:  Responding MD:  Jennet Maduro  Time MD responded:  [phone]

## 2013-02-04 NOTE — Progress Notes (Addendum)
Notified of troponin elevation of 0.44. Paged NP on call. Patient is resting with no complaints of pain or pressure. Asked to notify when third troponin results. No new orders at this time. Will continue to monitor patient.

## 2013-02-04 NOTE — Progress Notes (Signed)
[facility] PROGRESS NOTE  Gary Yates. [name] DOB: [birth year] DOA: 02/03/2013 PCP: [name], MD  Assessment/Plan: Chest pain with Elevated Troponin -CT angio chest negative for PE. -Plan (as discussed with Dr. Acie Fredrickson and Dr. Glennie Hawk): DC heparin, DC ASA, start plavix, continue coumadin for a fib, continue to trend troponins. In the abscence of continued chest pain and rising troponins, will elect to treat conservatively; otherwise, may need to reconsider repeat cardiac cath (last performed 1/21). -This plan has been discussed with the patient and his family.  CKD Stage III -Cr at baseline of 1.4-1.5. -Follow with contrast received today for CT Angio.  DM II -Start SSI. -Continue lantus.  HTN -Well controlled.  Morbid Obesity -Discussed weight loss.  Atrial Fibrillation -Currently in NSR. -On coumadin. -Subtherapeutic; pharmacy is dosing.   Code Status: Full Code Family Communication: Discussed with wife and daughter at bedside.  Disposition Plan: Home when stable, hopefully 24-48 hours.   Consultants:  Cardiology, Dr. Acie Fredrickson   Antibiotics:  None   Subjective: No complaints. Discouraged about possibility of repeat cath.  Objective: Filed Vitals:   02/03/13 2045 02/03/13 2100 02/03/13 2151 02/04/13 0639  BP: 124/64 124/68 118/72 122/62  Pulse: 89 87 87 80  Temp:   97.4 F (36.3 C) 97.4 F (36.3 C)  TempSrc:   Oral Oral  Resp:   18 18  Height:   5\' 9"  (1.753 m)   Weight:   122.38 kg (269 lb 12.8 oz)   SpO2: 94% 96% 95% 96%    Intake/Output Summary (Last 24 hours) at 02/04/13 1355 Last data filed at 02/03/13 2328  Gross per 24 hour  Intake      3 ml  Output      0 ml  Net      3 ml   Filed Weights   02/03/13 2151  Weight: 122.38 kg (269 lb 12.8 oz)    Exam:   General:  AA Ox3, NAD  Cardiovascular: RRR  Respiratory: CTA B  Abdomen: obese, ND/+BS  Extremities: 1+edema bilaterally.   Neurologic:  Intact,  non-focal.  Data Reviewed: Basic Metabolic Panel:  Recent Labs Lab 02/03/13 1724  NA 132*  K 3.6*  CL 93*  CO2 22  GLUCOSE 269*  BUN 19  CREATININE 1.47*  CALCIUM 10.0   Liver Function Tests:  Recent Labs Lab 02/03/13 1724  AST 54*  ALT 21  ALKPHOS 57  BILITOT 0.4  PROT 7.0  ALBUMIN 3.3*   No results found for this basename: LIPASE, AMYLASE,  in the last 168 hours No results found for this basename: AMMONIA,  in the last 168 hours CBC:  Recent Labs Lab 02/03/13 1724  WBC 10.0  NEUTROABS 7.1  HGB 15.3  HCT 44.5  MCV 92.5  PLT 241   Cardiac Enzymes:  Recent Labs Lab 02/03/13 1724 02/03/13 2321 02/04/13 0431 02/04/13 0910  TROPONINI <0.30 0.44* 0.64* 0.48*   BNP (last 3 results)  Recent Labs  02/03/13 1724  PROBNP 351.7   CBG:  Recent Labs Lab 02/03/13 2058 02/04/13 0755 02/04/13 1142  GLUCAP 137* 183* 222*    No results found for this or any previous visit (from the past 240 hour(s)).   Studies: Dg Chest 2 View  02/03/2013   CLINICAL DATA:  Chest pain and shortness of breath.  EXAM: CHEST  2 VIEW  COMPARISON:  05/14/2006  FINDINGS: Lungs are adequately inflated with mild symmetric hazy density over the bases not seen on the lateral film likely  due to overlying soft tissues and lordotic technique. There is stable cardiomegaly. Remainder the exam is unchanged.  IMPRESSION: No active cardiopulmonary disease.  Cardiomegaly.   Electronically Signed   By: Marin Olp M.D.   On: 02/03/2013 18:24   Ct Angio Chest Pe W/cm &/or Wo Cm  02/04/2013   CLINICAL DATA:  Chest pain.  EXAM: CT ANGIOGRAPHY CHEST WITH CONTRAST  TECHNIQUE: Multidetector CT imaging of the chest was performed using the standard protocol during bolus administration of intravenous contrast. Multiplanar CT image reconstructions including MIPs were obtained to evaluate the vascular anatomy.  CONTRAST:  158mL OMNIPAQUE IOHEXOL 350 MG/ML SOLN  COMPARISON:  DG CHEST 2 VIEW dated  02/03/2013  FINDINGS: Thoracic aorta unremarkable. Pulmonary arteries are unremarkable. Coronary artery disease. Cardiomegaly.  Shotty nonspecific mediastinal lymph nodes. The largest measures 13 mm in the AP window, image number 36/series. The thoracic esophagus is unremarkable. Visualized adrenals normal. Visualized upper abdominal viscera unremarkable.  Large airways patent. Mild bibasilar atelectasis and pleural parenchymal scarring. No pleural effusion or pneumothorax.  Chest wall is intact.  Degenerative changes thoracic spine .  Review of the MIP images confirms the above findings.  IMPRESSION: No evidence of pulmonary embolus. Mild basilar atelectasis and pleural parenchymal scarring.   Electronically Signed   By: [place]   On: 02/04/2013 10:18    Scheduled Meds: . amitriptyline  10 mg Oral QHS  . atorvastatin  40 mg Oral q1800  . [START ON 02/05/2013] clopidogrel  75 mg Oral Q breakfast  . gabapentin  1,200 mg Oral QPM  . [START ON 02/05/2013] gabapentin  600 mg Oral Daily  . glimepiride  4 mg Oral Daily  . insulin aspart  0-20 Units Subcutaneous TID WC  . insulin aspart  6 Units Subcutaneous TID WC  . insulin glargine  50 Units Subcutaneous BID  . metoprolol succinate  100 mg Oral Daily  . pantoprazole  40 mg Oral Daily  . pramipexole  0.375 mg Oral QHS  . sodium chloride  3 mL Intravenous Q12H  . sodium chloride  3 mL Intravenous Q12H  . Warfarin - Pharmacist Dosing Inpatient   Does not apply q1800   Continuous Infusions:   Principal Problem:   Chest pain Active Problems:   Morbid obesity   HYPERTENSION   Midsternal chest pain   Paroxysmal atrial fibrillation   Poorly controlled type 2 diabetes mellitus with neuropathy   Chronic kidney disease, stage III (moderate)    Time spent: 45 minutes. Greater than 50% of this time was spent in direct contact with the patient coordinating care.    Lelon Frohlich  [facility] Pager [phone]  If 7PM-7AM,  please contact night-coverage at [email or web address], password [facility] 02/04/2013, 1:55 PM  LOS: 1 day

## 2013-02-04 NOTE — Progress Notes (Signed)
Inpatient Diabetes Program Recommendations  AACE/ADA: New Consensus Statement on Inpatient Glycemic Control (2013)  Target Ranges:  Prepandial:   less than 140 mg/dL      Peak postprandial:   less than 180 mg/dL (1-2 hours)      Critically ill patients:  140 - 180 mg/dL    Results for CHIN, CERMENO (MRN [id number]) as of 02/04/2013 12:18  Ref. Range 02/04/2013 07:55 02/04/2013 11:42  Glucose-Capillary Latest Range: 70-99 mg/dL 183 (H) 222 (H)    Results for PRINCETON, CARNAHAN (MRN [id number]) as of 02/04/2013 12:18  Ref. Range 01/26/2013 08:05  Hemoglobin A1C Latest Range: 4.6-6.5 % 11.3 (H)    Diabetes history: Type 2 DM- Sees Dr. Philemon Kingdom with [facility].  Saw Dr. Cruzita Lederer an an outpatient on 02/02/13 and DM medication adjustments were made.  Outpatient Diabetes medications: Lantus 50 units bid + Novolog ~20 units tid with meals (Amaryl was d/c'd from home meds by Dr. Cruzita Lederer on 01/27)  Current orders for Inpatient glycemic control: Lantus 50 units bid + Amaryl 4 mg daily    **MD- Please consider the following recommendations:  1. Stop Amaryl 4 mg daily 2. Start Novolog Moderate SSI tid ac + HS 3. Consider adding at least 1/2 patient's home meal coverage dose if patient is eating well- Novolog 10 units tid with meals    Will follow. Wyn Quaker RN, MSN, CDE Diabetes Coordinator Inpatient Diabetes Program Team Pager: [phone] (8a-10p)

## 2013-02-04 NOTE — Progress Notes (Signed)
ANTICOAGULATION CONSULT NOTE - Follow Up Consult  Pharmacy Consult for Heparin and Coumadin Indication: atrial fibrillation  Allergies  Allergen Reactions  . Ace Inhibitors Other (See Comments)    cough  . Codeine Nausea Only and Rash       . Penicillins Rash    Patient Measurements: Height: 5\' 9"  (175.3 cm) Weight: 269 lb 12.8 oz (122.38 kg) IBW/kg (Calculated) : 70.7 Heparin Dosing Weight:   Vital Signs: Temp: 98.1 F (36.7 C) (01/29 1400) Temp src: Oral (01/29 1400) BP: 130/57 mmHg (01/29 1400) Pulse Rate: 87 (01/29 1400)  Labs:  Recent Labs  02/03/13 1724 02/03/13 2321 02/04/13 0431 02/04/13 0910 02/04/13 1256  HGB 15.3  --   --   --   --   HCT 44.5  --   --   --   --   PLT 241  --   --   --   --   LABPROT 16.1*  --  16.1*  --   --   INR 1.32  --  1.32  --   --   HEPARINUNFRC  --   --   --   --  <0.10*  CREATININE 1.47*  --   --   --   --   TROPONINI <0.30 0.44* 0.64* 0.48*  --     Estimated Creatinine Clearance: 53.5 ml/min (by C-G formula based on Cr of 1.47).   Assessment: 80yom on heparin and Coumadin for Afib. CTA was negative for PE - confirmed with Dr. Jerilee Hoh to stop heparin drip and continue Coumadin. INR (1.32) is subhtherapeutic. Patient received 10mg  last PM - will reduce dose to 7.5mg  as patient's PTA regimen is 5mg  daily except 7.5mg  on Wednesdays. - H/H and Plts wnl - No significant bleeding reported  Goal of Therapy:  INR 2-3 Monitor platelets by anticoagulation protocol: Yes   Plan:  1. Discontinue heparin levels and protocol 2. Coumadin 7.5mg  PO x 1 today 3. Daily INR 4. Follow-up cath plans  Earleen Newport [id number] 02/04/2013,4:20 PM

## 2013-02-05 DIAGNOSIS — I2510 Atherosclerotic heart disease of native coronary artery without angina pectoris: Secondary | ICD-10-CM

## 2013-02-05 LAB — BASIC METABOLIC PANEL
BUN: 20 mg/dL (ref 6–23)
CALCIUM: 9.5 mg/dL (ref 8.4–10.5)
CO2: 28 mEq/L (ref 19–32)
CREATININE: 1.58 mg/dL — AB (ref 0.50–1.35)
Chloride: 97 mEq/L (ref 96–112)
GFR calc Af Amer: 47 mL/min — ABNORMAL LOW (ref >90)
GFR calc non Af Amer: 40 mL/min — ABNORMAL LOW (ref >90)
GLUCOSE: 202 mg/dL — AB (ref 70–99)
Potassium: 4.3 mEq/L (ref 3.7–5.3)
Sodium: 139 mEq/L (ref 137–147)

## 2013-02-05 LAB — GLUCOSE, CAPILLARY: Glucose-Capillary: 156 mg/dL — ABNORMAL HIGH (ref 70–99)

## 2013-02-05 LAB — CBC
HCT: 40.6 % (ref 39.0–52.0)
Hemoglobin: 13.6 g/dL (ref 13.0–17.0)
MCH: 31.5 pg (ref 26.0–34.0)
MCHC: 33.5 g/dL (ref 30.0–36.0)
MCV: 94 fL (ref 78.0–100.0)
PLATELETS: 217 10*3/uL (ref 150–400)
RBC: 4.32 MIL/uL (ref 4.22–5.81)
RDW: 14.4 % (ref 11.5–15.5)
WBC: 7 10*3/uL (ref 4.0–10.5)

## 2013-02-05 LAB — PROTIME-INR
INR: 1.6 — ABNORMAL HIGH (ref 0.00–1.49)
Prothrombin Time: 18.6 seconds — ABNORMAL HIGH (ref 11.6–15.2)

## 2013-02-05 LAB — TROPONIN I: TROPONIN I: 0.38 ng/mL — AB (ref <0.30)

## 2013-02-05 MED ORDER — ISOSORBIDE MONONITRATE ER 30 MG PO TB24: 30.0000 mg | ORAL_TABLET | Freq: Every day | ORAL | Status: DC

## 2013-02-05 MED ORDER — CLOPIDOGREL BISULFATE 75 MG PO TABS: 75.0000 mg | ORAL_TABLET | Freq: Every day | ORAL | Status: DC

## 2013-02-05 MED ORDER — WARFARIN SODIUM 5 MG PO TABS
5.0000 mg | ORAL_TABLET | Freq: Once | ORAL | Status: DC
  Filled 2013-02-05: qty 1

## 2013-02-05 NOTE — Progress Notes (Signed)
ANTICOAGULATION CONSULT NOTE - Follow Up Consult  Pharmacy Consult for Heparin and Coumadin Indication: atrial fibrillation  Allergies  Allergen Reactions  . Ace Inhibitors Other (See Comments)    cough  . Codeine Nausea Only and Rash       . Penicillins Rash    Patient Measurements: Height: 5\' 9"  (175.3 cm) Weight: 269 lb 12.8 oz (122.38 kg) IBW/kg (Calculated) : 70.7 Heparin Dosing Weight:   Vital Signs: Temp: 97.8 F (36.6 C) (01/30 0513) Temp src: Oral (01/30 0513) BP: 107/56 mmHg (01/30 0513) Pulse Rate: 83 (01/30 0513)  Labs:  Recent Labs  02/03/13 1724  02/04/13 0431 02/04/13 0910 02/04/13 1256 02/04/13 1505 02/04/13 1944 02/05/13 0150 02/05/13 0418  HGB 15.3  --   --   --   --   --   --   --  13.6  HCT 44.5  --   --   --   --   --   --   --  40.6  PLT 241  --   --   --   --   --   --   --  217  LABPROT 16.1*  --  16.1*  --   --   --   --   --  18.6*  INR 1.32  --  1.32  --   --   --   --   --  1.60*  HEPARINUNFRC  --   --   --   --  <0.10*  --   --   --   --   CREATININE 1.47*  --   --   --   --   --   --   --  1.58*  TROPONINI <0.30  < > 0.64* 0.48*  --  0.34* 0.46* 0.38*  --   < > = values in this interval not displayed.  Estimated Creatinine Clearance: 49.8 ml/min (by C-G formula based on Cr of 1.58).   Assessment: 80 yo on Coumadin for Afib. CTA was negative for PE, heparin has been discontinued. INR is is subtherapeutic, 1.6 but is increasing appropriately. Pt was on coumadin pta with a dose of 5 mg daily, 7.5 mg on Wednesdays. No bleeding is noted, CBC is stable.  Goal of Therapy:  INR 2-3 Monitor platelets by anticoagulation protocol: Yes   Plan:   Coumadin 5 mg po x1  Daily INR  Hughes Better, PharmD, BCPS Clinical Pharmacist 02/05/2013. 8:37 AM

## 2013-02-05 NOTE — Discharge Summary (Signed)
Physician Discharge Summary  Gary Yates. [name] DOB: 1933/11/05 DOA: 02/03/2013  PCP: [name], MD  Admit date: 02/03/2013 Discharge date: 02/05/2013  Time spent: 45 minutes  Recommendations for Outpatient Follow-up:  -Will be discharged home today. -Advised to follow up with PCP in 2 weeks and with Dr. Angelena Form in 2 weeks as well. -Has been advised to secure a follow up appointment for Monday for the [facility] to check his INR.  Discharge Diagnoses:  Principal Problem:   Chest pain Active Problems:   Morbid obesity   HYPERTENSION   Midsternal chest pain   Paroxysmal atrial fibrillation   Poorly controlled type 2 diabetes mellitus with neuropathy   Chronic kidney disease, stage III (moderate)   Discharge Condition: Stable and improved  Filed Weights   02/03/13 2151  Weight: 122.38 kg (269 lb 12.8 oz)    History of present illness:  80 yo male with h/o dm, htn, cad, copd s/p cath last week showing nonobs cad with patent stents and moderate disease in LAD unchanged from prev cath comes in with sscp that has lasted all day long since after eating his breakfast around 11 am. The pain associated with nausea no vomiting. Pt just received some asA alone in ED and relieved his pain about 30 min ago. No le edema or swelling. Has gerd but usually doesn't feel like this. No fevers. No cough. Feels back to normal now. Hospitalist admission was requested.   Hospital Course:   Chest pain with Elevated Troponin  -CT angio chest negative for PE.  -Plan (as discussed with Dr. Acie Fredrickson and Dr. Alba Cory ASA, start plavix, continue coumadin for a fib. -Has not had further CP and plan to DC home today with follow up with his cardiologist. -This plan has been discussed with the patient and his family.   CKD Stage III  -Cr at baseline of 1.4-1.5.  -Follow with contrast received today for CT Angio.   DM II  -Well controlled. -Continue home regimen. -Follows with  endocrine, Dr. Cruzita Lederer.  HTN  -Well controlled.   Morbid Obesity  -Discussed weight loss.   Atrial Fibrillation  -Currently in NSR.  -On coumadin.  -Subtherapeutic; will Dc on home dose of coumadin and will need to follow in short order with the coumadin clinic for adequate dosing adjustments.    Procedures:  None   Consultations:  Cardiology  Discharge Instructions  Discharge Orders   Future Appointments Provider Department Dept Phone   02/19/2013 2:30 PM Kathee Delton, MD [facility] [phone]   02/26/2013 8:45 AM Burnell Blanks, MD [place] [facility] [phone]   03/04/2013 10:00 AM [facility] [place]   03/05/2013 10:15 AM Philemon Kingdom, MD [facility] [phone]   05/21/2013 8:30 AM [name], MD [facility] [place]   Future Orders Complete By Expires   Diet - low sodium heart healthy  As directed    Discontinue IV  As directed    Increase activity slowly  As directed        Medication List    STOP taking these medications       aspirin 81 MG tablet      TAKE these medications       amitriptyline 10 MG tablet  Commonly known as:  ELAVIL  Take 1 tablet (10 mg total) by mouth at bedtime.     atorvastatin 40 MG tablet  Commonly known as:  LIPITOR  Take 1 tablet (40 mg total) by mouth daily.     CALCIUM + D PO  Take 1 tablet by mouth daily.     clopidogrel 75 MG tablet  Commonly known as:  PLAVIX  Take 1 tablet (75 mg total) by mouth daily with breakfast.     fenofibrate micronized 134 MG capsule  Commonly known as:  LOFIBRA  Take 1 capsule by mouth  daily before breakfast     gabapentin 300 MG capsule  Commonly known as:  NEURONTIN  600 mg at breakfast and lunch and 1200 mg at night     glimepiride 4 MG tablet  Commonly known as:  AMARYL  Take 4 mg by mouth daily.     insulin aspart 100 UNIT/ML FlexPen   Commonly known as:  NOVOLOG FLEXPEN  Inject under skin up to 62 units daily     insulin glargine 100 UNIT/ML injection  Commonly known as:  LANTUS  Inject 50 Units into the skin 2 (two) times daily.     isosorbide mononitrate 30 MG 24 hr tablet  Commonly known as:  IMDUR  Take 1 tablet (30 mg total) by mouth daily.     metoprolol succinate 100 MG 24 hr tablet  Commonly known as:  TOPROL-XL  Take 1 tablet by mouth  every day immediately  following a meal     nitroGLYCERIN 0.4 MG SL tablet  Commonly known as:  NITROSTAT  Place 0.4 mg under the tongue every 5 (five) minutes as needed. Chest pain     omeprazole 20 MG capsule  Commonly known as:  PRILOSEC  Take 1 capsule by mouth  daily     pramipexole 0.125 MG tablet  Commonly known as:  MIRAPEX  Take 0.375 mg by mouth at bedtime. Take 3 tablets at night.     testosterone cypionate 200 MG/ML injection  Commonly known as:  DEPO-TESTOSTERONE  Inject 1 mL (200 mg total) into the muscle every 28 (twenty-eight) days.     warfarin 5 MG tablet  Commonly known as:  COUMADIN  Take 5 mg by mouth one time only at 6 PM. Pt takes 7.5 mg on wednesdays and 5 mg on all other days       Allergies  Allergen Reactions  . Ace Inhibitors Other (See Comments)    cough  . Codeine Nausea Only and Rash       . Penicillins Rash       Follow-up Information   Follow up with [name], MD. Schedule an appointment as soon as possible for a visit in 2 weeks.   Specialty:  Family Medicine   Contact information:   [place] [address] [phone]       Follow up with Lauree Chandler, MD. Schedule an appointment as soon as possible for a visit in 2 weeks.   Specialty:  Cardiology   Contact information:   [address] [place] [address] [phone]        The results of significant diagnostics from this hospitalization (including imaging, microbiology, ancillary and laboratory)  are listed below for reference.    Significant Diagnostic Studies: Dg Chest 2 View  02/03/2013   CLINICAL DATA:  Chest pain and shortness of breath.  EXAM: CHEST  2 VIEW  COMPARISON:  05/14/2006  FINDINGS: Lungs are adequately inflated with mild symmetric hazy density over the bases not seen on the lateral film likely due to overlying soft tissues and lordotic technique.  There is stable cardiomegaly. Remainder the exam is unchanged.  IMPRESSION: No active cardiopulmonary disease.  Cardiomegaly.   Electronically Signed   By: Marin Olp M.D.   On: 02/03/2013 18:24   Ct Angio Chest Pe W/cm &/or Wo Cm  02/04/2013   CLINICAL DATA:  Chest pain.  EXAM: CT ANGIOGRAPHY CHEST WITH CONTRAST  TECHNIQUE: Multidetector CT imaging of the chest was performed using the standard protocol during bolus administration of intravenous contrast. Multiplanar CT image reconstructions including MIPs were obtained to evaluate the vascular anatomy.  CONTRAST:  171mL OMNIPAQUE IOHEXOL 350 MG/ML SOLN  COMPARISON:  DG CHEST 2 VIEW dated 02/03/2013  FINDINGS: Thoracic aorta unremarkable. Pulmonary arteries are unremarkable. Coronary artery disease. Cardiomegaly.  Shotty nonspecific mediastinal lymph nodes. The largest measures 13 mm in the AP window, image number 36/series. The thoracic esophagus is unremarkable. Visualized adrenals normal. Visualized upper abdominal viscera unremarkable.  Large airways patent. Mild bibasilar atelectasis and pleural parenchymal scarring. No pleural effusion or pneumothorax.  Chest wall is intact.  Degenerative changes thoracic spine .  Review of the MIP images confirms the above findings.  IMPRESSION: No evidence of pulmonary embolus. Mild basilar atelectasis and pleural parenchymal scarring.   Electronically Signed   By: Marcello Moores  Register   On: 02/04/2013 10:18    Microbiology: No results found for this or any previous visit (from the past 240 hour(s)).   Labs: Basic Metabolic Panel:  Recent  Labs Lab 02/03/13 1724 02/05/13 0418  NA 132* 139  K 3.6* 4.3  CL 93* 97  CO2 22 28  GLUCOSE 269* 202*  BUN 19 20  CREATININE 1.47* 1.58*  CALCIUM 10.0 9.5   Liver Function Tests:  Recent Labs Lab 02/03/13 1724  AST 54*  ALT 21  ALKPHOS 57  BILITOT 0.4  PROT 7.0  ALBUMIN 3.3*   No results found for this basename: LIPASE, AMYLASE,  in the last 168 hours No results found for this basename: AMMONIA,  in the last 168 hours CBC:  Recent Labs Lab 02/03/13 1724 02/05/13 0418  WBC 10.0 7.0  NEUTROABS 7.1  --   HGB 15.3 13.6  HCT 44.5 40.6  MCV 92.5 94.0  PLT 241 217   Cardiac Enzymes:  Recent Labs Lab 02/04/13 0431 02/04/13 0910 02/04/13 1505 02/04/13 1944 02/05/13 0150  TROPONINI 0.64* 0.48* 0.34* 0.46* 0.38*   BNP: BNP (last 3 results)  Recent Labs  02/03/13 1724  PROBNP 351.7   CBG:  Recent Labs Lab 02/04/13 0755 02/04/13 1142 02/04/13 1642 02/04/13 2026 02/05/13 0736  GLUCAP 183* 222* 231* 174* 156*       Signed:  HERNANDEZ ACOSTA,ESTELA  [facility] Pager: [phone] 02/05/2013, 11:07 AM

## 2013-02-05 NOTE — Progress Notes (Signed)
Inpatient Diabetes Program Recommendations  AACE/ADA: New Consensus Statement on Inpatient Glycemic Control (2013)  Target Ranges:  Prepandial:   less than 140 mg/dL      Peak postprandial:   less than 180 mg/dL (1-2 hours)      Critically ill patients:  140 - 180 mg/dL     Results for Gary Yates, Gary Yates (MRN [id number]) as of 02/05/2013 08:27  Ref. Range 02/04/2013 07:55 02/04/2013 11:42 02/04/2013 16:42 02/04/2013 20:26  Glucose-Capillary Latest Range: 70-99 mg/dL 183 (H) 222 (H) 231 (H) 174 (H)    Results for Gary Yates, Gary Yates (MRN [id number]) as of 02/05/2013 08:27  Ref. Range 02/05/2013 07:36  Glucose-Capillary Latest Range: 70-99 mg/dL 156 (H)    Diabetes history: Type 2 DM- Sees Dr. Philemon Kingdom with [facility]. Saw Dr. Cruzita Lederer an an outpatient on 02/02/13 and DM medication adjustments were made.   Outpatient Diabetes medications: Lantus 50 units bid + Novolog ~20 units tid with meals (Amaryl was d/c'd from home meds by Dr. Cruzita Lederer on 01/27)   Current orders for Inpatient glycemic control: Lantus 50 units bid + Amaryl 4 mg daily + Novolog Resistant SSI + Novolog 6 units tid with meals    **Note that Novolog Resistant SSI + Novolog 6 units tid with meals started last evening at suppertime.  AM CBG slightly improved today (156 mg/dl).   Will follow. Wyn Quaker RN, MSN, CDE Diabetes Coordinator Inpatient Diabetes Program Team Pager: [phone] (8a-10p)

## 2013-02-05 NOTE — Discharge Instructions (Addendum)
Diets for Diabetes, Food Labeling Look at food labels to help you decide how much of a product you can eat. You will want to check the amount of total carbohydrate in a serving to see how the food fits into your meal plan. In the list of ingredients, the ingredient present in the largest amount by weight must be listed first, followed by the other ingredients in descending order. STANDARD OF IDENTITY Most products have a list of ingredients. However, foods that the Food and Drug Administration (FDA) has given a standard of identity do not need a list of ingredients. A standard of identity means that a food must contain certain ingredients if it is called a particular name. Examples are mayonnaise, peanut butter, ketchup, jelly, and cheese. LABELING TERMS There are many terms found on food labels. Some of these terms have specific definitions. Some terms are regulated by the FDA, and the FDA has clearly specified how they can be used. Others are not regulated or well-defined and can be misleading and confusing. SPECIFICALLY DEFINED TERMS Nutritive Sweetener.  A sweetener that contains calories,such as table sugar or honey. Nonnutritive Sweetener.  A sweetener with few or no calories,such as saccharin, aspartame, sucralose, and cyclamate. LABELING TERMS REGULATED BY THE FDA Free.  The product contains only a tiny or small amount of fat, cholesterol, sodium, sugar, or calories. For example, a "fat-free" product will contain less than 0.5 g of fat per serving. Low.  A food described as "low" in fat, saturated fat, cholesterol, sodium, or calories could be eaten fairly often without exceeding dietary guidelines. For example, "low in fat" means no more than 3 g of fat per serving. Lean.  "Lean" and "extra lean" are U.S. Department of Agriculture Scientist, research (physical sciences)) terms for use on meat and poultry products. "Lean" means the product contains less than 10 g of fat, 4 g of saturated fat, and 95 mg of cholesterol  per serving. "Lean" is not as low in fat as a product labeled "low." Extra Lean.  "Extra lean" means the product contains less than 5 g of fat, 2 g of saturated fat, and 95 mg of cholesterol per serving. While "extra lean" has less fat than "lean," it is still higher in fat than a product labeled "low." Reduced, Less, Fewer.  A diet product that contains 25% less of a nutrient or calories than the regular version. For example, hot dogs might be labeled "25% less fat than our regular hot dogs." Light/Lite.  A diet product that contains  fewer calories or  the fat of the original. For example, "light in sodium" means a product with  the usual sodium. More.  One serving contains at least 10% more of the daily value of a vitamin, mineral, or fiber than usual. Good Source Of.  One serving contains 10% to 19% of the daily value for a particular vitamin, mineral, or fiber. Excellent Source Of.  One serving contains 20% or more of the daily value for a particular nutrient. Other terms used might be "high in" or "rich in." Enriched or Fortified.  The product contains added vitamins, minerals, or protein. Nutrition labeling must be used on enriched or fortified foods. Imitation.  The product has been altered so that it is lower in protein, vitamins, or minerals than the usual food,such as imitation peanut butter. Total Fat.  The number listed is the total of all fat found in a serving of the product. Under total fat, food labels must list saturated fat and  trans fat, which are associated with raising bad cholesterol and an increased risk of heart blood vessel disease. Saturated Fat.  Mainly fats from animal-based sources. Some examples are red meat, cheese, cream, whole milk, and coconut oil. Trans Fat.  Found in some fried snack foods, packaged foods, and fried restaurant foods. It is recommended you eat as close to 0 g of trans fat as possible, since it raises bad cholesterol and lowers  good cholesterol. Polyunsaturated and Monounsaturated Fats.  More healthful fats. These fats are from plant sources. Total Carbohydrate.  The number of carbohydrate grams in a serving of the product. Under total carbohydrate are listed the other carbohydrate sources, such as dietary fiber and sugars. Dietary Fiber.  A carbohydrate from plant sources. Sugars.  Sugars listed on the label contain all naturally occurring sugars as well as added sugars. LABELING TERMS NOT REGULATED BY THE FDA Sugarless.  Table sugar (sucrose) has not been added. However, the manufacturer may use another form of sugar in place of sucrose to sweeten the product. For example, sugar alcohols are used to sweeten foods. Sugar alcohols are a form of sugar but are not table sugar. If a product contains sugar alcohols in place of sucrose, it can still be labeled "sugarless." Low Salt, Salt-Free, Unsalted, No Salt, No Salt Added, Without Added Salt.  Food that is usually processed with salt has been made without salt. However, the food may contain sodium-containing additives, such as preservatives, leavening agents, or flavorings. Natural.  This term has no legal meaning. Organic.  Foods that are certified as organic have been inspected and approved by the USDA to ensure they are produced without pesticides, fertilizers containing synthetic ingredients, bioengineering, or ionizing radiation. Document Released: 12/27/2002 Document Revised: 03/18/2011 Document Reviewed: 07/14/2008 [facility] Patient Information 2014 [place], [place].  Diabetes Meal Planning Guide The diabetes meal planning guide is a tool to help you plan your meals and snacks. It is important for people with diabetes to manage their blood glucose (sugar) levels. Choosing the right foods and the right amounts throughout your day will help control your blood glucose. Eating right can even help you improve your blood pressure and reach or maintain a healthy  weight. CARBOHYDRATE COUNTING MADE EASY When you eat carbohydrates, they turn to sugar. This raises your blood glucose level. Counting carbohydrates can help you control this level so you feel better. When you plan your meals by counting carbohydrates, you can have more flexibility in what you eat and balance your medicine with your food intake. Carbohydrate counting simply means adding up the total amount of carbohydrate grams in your meals and snacks. Try to eat about the same amount at each meal. Foods with carbohydrates are listed below. Each portion below is 1 carbohydrate serving or 15 grams of carbohydrates. Ask your dietician how many grams of carbohydrates you should eat at each meal or snack. Grains and Starches  1 slice bread.   English muffin or hotdog/hamburger bun.   cup cold cereal (unsweetened).   cup cooked pasta or rice.   cup starchy vegetables (corn, potatoes, peas, beans, winter squash).  1 tortilla (6 inches).   bagel.  1 waffle or pancake (size of a CD).   cup cooked cereal.  4 to 6 small crackers. *Whole grain is recommended. Fruit  1 cup fresh unsweetened berries, melon, papaya, pineapple.  1 small fresh fruit.   banana or mango.   cup fruit juice (4 oz unsweetened).   cup canned fruit in natural  juice or water.  2 tbs dried fruit.  12 to 15 grapes or cherries. Milk and Yogurt  1 cup fat-free or 1% milk.  1 cup soy milk.  6 oz light yogurt with sugar-free sweetener.  6 oz low-fat soy yogurt.  6 oz plain yogurt. Vegetables  1 cup raw or  cup cooked is counted as 0 carbohydrates or a "free" food.  If you eat 3 or more servings at 1 meal, count them as 1 carbohydrate serving. Other Carbohydrates   oz chips or pretzels.   cup ice cream or frozen yogurt.   cup sherbet or sorbet.  2 inch square cake, no frosting.  1 tbs honey, sugar, jam, jelly, or syrup.  2 small cookies.  3 squares of graham crackers.  3 cups  popcorn.  6 crackers.  1 cup broth-based soup.  Count 1 cup casserole or other mixed foods as 2 carbohydrate servings.  Foods with less than 20 calories in a serving may be counted as 0 carbohydrates or a "free" food. You may want to purchase a book or computer software that lists the carbohydrate gram counts of different foods. In addition, the nutrition facts panel on the labels of the foods you eat are a good source of this information. The label will tell you how big the serving size is and the total number of carbohydrate grams you will be eating per serving. Divide this number by 15 to obtain the number of carbohydrate servings in a portion. Remember, 1 carbohydrate serving equals 15 grams of carbohydrate. SERVING SIZES Measuring foods and serving sizes helps you make sure you are getting the right amount of food. The list below tells how big or small some common serving sizes are.  1 oz.........4 stacked dice.  3 oz........Marland KitchenDeck of cards.  1 tsp.......Marland KitchenTip of little finger.  1 tbs......Marland KitchenMarland KitchenThumb.  2 tbs.......Marland KitchenGolf ball.   cup......Marland KitchenHalf of a fist.  1 cup.......Marland KitchenA fist. SAMPLE DIABETES MEAL PLAN Below is a sample meal plan that includes foods from the grain and starches, dairy, vegetable, fruit, and meat groups. A dietician can individualize a meal plan to fit your calorie needs and tell you the number of servings needed from each food group. However, controlling the total amount of carbohydrates in your meal or snack is more important than making sure you include all of the food groups at every meal. You may interchange carbohydrate containing foods (dairy, starches, and fruits). The meal plan below is an example of a 2000 calorie diet using carbohydrate counting. This meal plan has 17 carbohydrate servings. Breakfast  1 cup oatmeal (2 carb servings).   cup light yogurt (1 carb serving).  1 cup blueberries (1 carb serving).   cup almonds. Snack  1 large apple (2 carb  servings).  1 low-fat string cheese stick. Lunch  Chicken breast salad.  1 cup spinach.   cup chopped tomatoes.  2 oz chicken breast, sliced.  2 tbs low-fat [place] dressing.  12 whole-wheat crackers (2 carb servings).  12 to 15 grapes (1 carb serving).  1 cup low-fat milk (1 carb serving). Snack  1 cup carrots.   cup hummus (1 carb serving). Dinner  3 oz broiled salmon.  1 cup brown rice (3 carb servings). Snack  1  cups steamed broccoli (1 carb serving) drizzled with 1 tsp olive oil and lemon juice.  1 cup light pudding (2 carb servings). DIABETES MEAL PLANNING WORKSHEET Your dietician can use this worksheet to help you decide how many servings  of foods and what types of foods are right for you.  BREAKFAST Food Group and Servings / Carb Servings Grain/Starches __________________________________ Dairy __________________________________________ Vegetable ______________________________________ Fruit ___________________________________________ Meat __________________________________________ Fat ____________________________________________ LUNCH Food Group and Servings / Carb Servings Grain/Starches ___________________________________ Dairy ___________________________________________ Fruit ____________________________________________ Meat ___________________________________________ Fat _____________________________________________ [employer] Food Group and Servings / Carb Servings Grain/Starches ___________________________________ Dairy ___________________________________________ Fruit ____________________________________________ Meat ___________________________________________ Fat _____________________________________________ SNACKS Food Group and Servings / Carb Servings Grain/Starches ___________________________________ Dairy ___________________________________________ Vegetable _______________________________________ Fruit  ____________________________________________ Meat ___________________________________________ Fat _____________________________________________ DAILY TOTALS Starches _________________________ Vegetable ________________________ Fruit ____________________________ Dairy ____________________________ Meat ____________________________ Fat ______________________________ Document Released: 09/20/2004 Document Revised: 03/18/2011 Document Reviewed: 08/01/2008 [employer] Patient Information 2014 [place].

## 2013-02-05 NOTE — Progress Notes (Signed)
SUBJECTIVE: No chest pain this am. Feels back to baseline. No SOB  BP 107/56  Pulse 83  Temp(Src) 97.8 F (36.6 C) (Oral)  Resp 16  Ht 5\' 9"  (1.753 m)  Wt 269 lb 12.8 oz (122.38 kg)  BMI 39.82 kg/m2  SpO2 95% No intake or output data in the 24 hours ending 02/05/13 0804  PHYSICAL EXAM General: Well developed, well nourished, in no acute distress. Alert and oriented x 3.  Psych:  Good affect, responds appropriately Neck: No JVD. No masses noted.  Lungs: Clear bilaterally with no wheezes or rhonci noted.  Heart: RRR with no murmurs noted. Abdomen: Bowel sounds are present. Soft, non-tender.  Extremities: 1+ bilateral lower extremity edema.   LABS: Basic Metabolic Panel:  Recent Labs  02/03/13 1724 02/05/13 0418  NA 132* 139  K 3.6* 4.3  CL 93* 97  CO2 22 28  GLUCOSE 269* 202*  BUN 19 20  CREATININE 1.47* 1.58*  CALCIUM 10.0 9.5   CBC:  Recent Labs  02/03/13 1724 02/05/13 0418  WBC 10.0 7.0  NEUTROABS 7.1  --   HGB 15.3 13.6  HCT 44.5 40.6  MCV 92.5 94.0  PLT 241 217   Cardiac Enzymes:  Recent Labs  02/04/13 1505 02/04/13 1944 02/05/13 0150  TROPONINI 0.34* 0.46* 0.38*    Current Meds: . amitriptyline  10 mg Oral QHS  . atorvastatin  40 mg Oral q1800  . clopidogrel  75 mg Oral Q breakfast  . gabapentin  1,200 mg Oral QHS  . gabapentin  600 mg Oral Daily  . gabapentin  600 mg Oral Q1200  . glimepiride  4 mg Oral Daily  . insulin aspart  0-20 Units Subcutaneous TID WC  . insulin aspart  6 Units Subcutaneous TID WC  . insulin glargine  50 Units Subcutaneous BID  . isosorbide mononitrate  30 mg Oral Daily  . metoprolol succinate  100 mg Oral Daily  . pantoprazole  40 mg Oral Daily  . pramipexole  0.375 mg Oral QHS  . sodium chloride  3 mL Intravenous Q12H  . sodium chloride  3 mL Intravenous Q12H  . Warfarin - Pharmacist Dosing Inpatient   Does not apply q1800   Cardiac cath 01/27/13:  Left main: No obstructive disease.  Left Anterior  Descending Artery: Large caliber vessel in the proximal segment with trifurcation in the mid segment into a large septal perforating branch, moderate caliber bifurcating diagonal branch and small to moderate caliber continuation of the LAD. There appears to be a 40-50% stenosis at the trifurcation in the mid LAD. The diagonal branch bifurcates. The superior branch of the diagonal after the bifurcation is small in caliber with 70% stenosis.  Circumflex Artery: Large caliber vessel with large caliber obtuse marginal branch. The proximal and mid vessel has diffuse 30% stenosis, unchanged. The obtuse marginal branch has diffuse 30% stenosis in the proximal segment of the vessel.  Right Coronary Artery: Large caliber dominant vessel with patent proximal and mid stented segment with 20% stent restenosis. Mild plaque in the large caliber PDA.  Left Ventricular Angiogram: Deferred.   ASSESSMENT AND PLAN:  1. CAD: Pt admitted with chest pain. Slight elevation of troponin. Peak of 0.46, trended down. Cath last week with  non-obstructive disease. CTA chest with no evidence of PE. Not clear what has caused his chest pain and elevated troponin. Chest pain is better. Imdur added yesterday. Would continue current medical regimen. ASA has been stopped. He has been  started on Plavix. He is back on coumadin for chronic atrial fibrillation. OK for discharge from cardiac standpoint. He has an appt to see me on 02/26/13.   2. Atrial fibrillation: Sinus this am. Continue metoprolol for rate control. Coumadin has been restarted. He does not need a therapeutic INR prior to discharge home.   3. Diabetes mellitus: Needs better control at home.   Fayrene Towner  1/30/20158:04 AM

## 2013-02-08 ENCOUNTER — Other Ambulatory Visit: Payer: Self-pay | Admitting: Family Medicine

## 2013-02-11 ENCOUNTER — Ambulatory Visit: Payer: Medicare Other | Admitting: Cardiovascular Disease

## 2013-02-19 ENCOUNTER — Institutional Professional Consult (permissible substitution): Payer: Medicare Other | Admitting: Pulmonary Disease

## 2013-02-24 ENCOUNTER — Institutional Professional Consult (permissible substitution): Payer: Medicare Other | Admitting: Pulmonary Disease

## 2013-02-26 ENCOUNTER — Encounter (INDEPENDENT_AMBULATORY_CARE_PROVIDER_SITE_OTHER): Payer: Self-pay

## 2013-02-26 ENCOUNTER — Ambulatory Visit (INDEPENDENT_AMBULATORY_CARE_PROVIDER_SITE_OTHER): Payer: Medicare Other | Admitting: Cardiovascular Disease

## 2013-02-26 ENCOUNTER — Encounter: Payer: Self-pay | Admitting: Cardiovascular Disease

## 2013-02-26 VITALS — BP 150/75 | HR 85 | Resp 12 | Ht 70.0 in | Wt 280.0 lb

## 2013-02-26 DIAGNOSIS — I480 Paroxysmal atrial fibrillation: Secondary | ICD-10-CM

## 2013-02-26 DIAGNOSIS — G4730 Sleep apnea, unspecified: Secondary | ICD-10-CM

## 2013-02-26 DIAGNOSIS — I2510 Atherosclerotic heart disease of native coronary artery without angina pectoris: Secondary | ICD-10-CM

## 2013-02-26 DIAGNOSIS — I4891 Unspecified atrial fibrillation: Secondary | ICD-10-CM

## 2013-02-26 NOTE — Patient Instructions (Signed)
Your physician wants you to follow-up in:  6 months. You will receive a reminder letter in the mail two months in advance. If you don't receive a letter, please call our office to schedule the follow-up appointment.   

## 2013-02-26 NOTE — Progress Notes (Signed)
History of Present Illness: 80 yo WM with history of HTN, hyperlipidemia, morbid obesity, DM, CAD, atrial fibrillation and obstructive lung disease here for follow up. He was admitted to [facility] 06/18/11 with chest pain. Cardiac cath on 06/18/11 with moderate LAD disease with patent RCA stents. No flow limiting lesions. Normal LVEF. D Dimer and CE were normal. He was also noted to have tachycardia which was felt to be atrial fibrillation. Dr. Caryl Comes saw him in the hospital and started Xarelto for anticoagulation and increased metoprolol for rate control. Slurred speech and seen by Neuro. Head CT without evidence of CVA. Head MRI with questionable tiny acute infarct posterior left frontal lobe, mild small vessel disease type changes, global atrophy without hydrocephalus. Carotid artery dopplers 11/11/12 with mild (0-39%) bilateral ICA stenosis. He decided not to start Xarelto due to cost. He was started on coumadin and has been followed in the coumadin clinic. He was seen 12/23/12 and had c/o chest pressure when working in the yard. Stress myoview 1/715 with small inferior wall defect from base to apex with mild peri-infarct ischemia. LVEF=53%. Low risk study. Cath 01/27/13 with stable disease. Admitted to [facility] 02/04/12 with chest pain. Mild elevation troponin. Started on Imdur and Plavix.   He is here today for follow up.   He is feeling better. Still with daytime fatigue and somnolence. He cancelled his appointment to discuss his sleep apnea and sleep study. No chest pain or SOB. He has been walking one mile 3-4 days per week without problems.   Primary Care Physician: Carolann Littler  Last Lipid Profile:Lipid Panel     Component Value Date/Time   CHOL 181 01/26/2013 0805   TRIG 511.0 Triglyceride is over 400; calculations on Lipids are invalid.* 01/26/2013 0805   HDL 32.90* 01/26/2013 0805   CHOLHDL 6 01/26/2013 0805   VLDL 102.2* 01/26/2013 0805   LDLCALC 81 06/20/2011 0552     Past  Medical History  Diagnosis Date  . Coronary artery disease     a. h/o Overlapping stents RCA;  b. 06/2011 Cath: patent stents, nonobs dzs, NL EF.  Marland Kitchen Hypertension   . Osteoarthritis     shoulder  . Restless leg   . Hyperlipidemia   . COPD (chronic obstructive pulmonary disease)   . Nephrolithiasis   . Dyspnea   . GERD (gastroesophageal reflux disease)   . Low testosterone   . SVT (supraventricular tachycardia)   . Atrial fibrillation   . DM (diabetes mellitus)     Type 2, peripheral neuropathy.  . Diabetic peripheral neuropathy     Past Surgical History  Procedure Laterality Date  . Cholecystectomy    . Cardiac catheterization  01/2013  . Coronary angioplasty  2004  . Back surgery  [place]  . Rotator cuff repair      Current Outpatient Prescriptions  Medication Sig Dispense Refill  . ACCU-CHEK AVIVA PLUS test strip TEST 3 TIMES A DAY  100 each  3  . amitriptyline (ELAVIL) 10 MG tablet Take 1 tablet (10 mg total) by mouth at bedtime.  30 tablet  2  . atorvastatin (LIPITOR) 40 MG tablet Take 1 tablet (40 mg total) by mouth daily.  90 tablet  3  . Calcium Carbonate-Vitamin D (CALCIUM + D PO) Take 1 tablet by mouth daily.       . clopidogrel (PLAVIX) 75 MG tablet Take 1 tablet (75 mg total) by mouth daily with breakfast.  30 tablet  2  .  fenofibrate micronized (LOFIBRA) 134 MG capsule Take 1 capsule by mouth  daily before breakfast  90 capsule  3  . gabapentin (NEURONTIN) 300 MG capsule 600 mg at breakfast and lunch and 1200 mg at night  300 capsule  11  . insulin aspart (NOVOLOG FLEXPEN) 100 UNIT/ML FlexPen Inject under skin up to 62 units daily  15 mL  11  . insulin glargine (LANTUS) 100 UNIT/ML injection Inject 50 Units into the skin 2 (two) times daily.      . isosorbide mononitrate (IMDUR) 30 MG 24 hr tablet Take 1 tablet (30 mg total) by mouth daily.  30 tablet  2  . metoprolol succinate (TOPROL-XL) 100 MG 24 hr tablet Take 1 tablet by mouth  every day immediately   following a meal  90 tablet  3  . nitroGLYCERIN (NITROSTAT) 0.4 MG SL tablet Place 0.4 mg under the tongue every 5 (five) minutes as needed. Chest pain      . omeprazole (PRILOSEC) 20 MG capsule Take 1 capsule by mouth  daily  90 capsule  3  . pramipexole (MIRAPEX) 0.125 MG tablet Take 0.375 mg by mouth at bedtime. Take 3 tablets at night.      . testosterone cypionate (DEPO-TESTOSTERONE) 200 MG/ML injection Inject 1 mL (200 mg total) into the muscle every 28 (twenty-eight) days.  10 mL  0  . warfarin (COUMADIN) 5 MG tablet Take 5 mg by mouth one time only at 6 PM. Pt takes 7.5 mg on wednesdays and 5 mg on all other days      . [DISCONTINUED] metFORMIN (GLUCOPHAGE) 500 MG tablet Take 1 tablet (500 mg total) by mouth 2 (two) times daily with a meal.  60 tablet  1  . [DISCONTINUED] metoprolol (LOPRESSOR) 50 MG tablet       . [DISCONTINUED] spironolactone (ALDACTONE) 25 MG tablet        No current facility-administered medications for this visit.   Facility-Administered Medications Ordered in Other Visits  Medication Dose Route Frequency Provider Last Rate Last Dose  . testosterone cypionate (DEPOTESTOTERONE CYPIONATE) injection 200 mg  200 mg Intramuscular Q28 days [name], MD   200 mg at 12/26/11 0850  . testosterone cypionate (DEPOTESTOTERONE CYPIONATE) injection 200 mg  200 mg Intramuscular Q28 days [name], MD   200 mg at 01/23/12 0908  . testosterone cypionate (DEPOTESTOTERONE CYPIONATE) injection 200 mg  200 mg Intramuscular Q28 days [name], MD   200 mg at 02/25/12 0848  . testosterone cypionate (DEPOTESTOTERONE CYPIONATE) injection 200 mg  200 mg Intramuscular Q28 days [name], MD   200 mg at 06/03/12 1437    Allergies  Allergen Reactions  . Ace Inhibitors Other (See Comments)    cough  . Codeine Nausea Only and Rash       . Penicillins Rash    History   Social History  . Marital Status: Married    Spouse Name: N/A    Number of  Children: 3  . Years of Education: N/A   Occupational History  . Retired    Social History Main Topics  . Smoking status: Former Smoker -- 1.50 packs/day for 20 years    Types: Cigarettes    Quit date: 04/05/1983  . Smokeless tobacco: Never Used  . Alcohol Use: No  . Drug Use: No  . Sexual Activity: Not Currently   Other Topics Concern  . Not on file   Social History Narrative  . No narrative on  file    Family History  Problem Relation Age of Onset  . Coronary artery disease      Male 1st degree relative <50  . Coronary artery disease      male 1st degree relative <60  . Heart disease Father   . Migraines Father   . Ulcers Father   . Alzheimer's disease Mother   . Heart disease Mother   . Heart disease Sister   . Obesity Sister     Morbid  . Arthritis Sister   . Heart disease Brother   . Arthritis Brother   . Sleep apnea Son   . Obesity Son   . Migraines Daughter   . Thyroid disease Daughter     Review of Systems:  As stated in the HPI and otherwise negative.   BP 150/75  Ht 5\' 10"  (1.778 m)  Wt 280 lb (127.007 kg)  BMI 40.18 kg/m2  Physical Examination: General: Well developed, well nourished, NAD HEENT: OP clear, mucus membranes moist SKIN: warm, dry. No rashes. Neuro: No focal deficits Musculoskeletal: Muscle strength 5/5 all ext Psychiatric: Mood and affect normal Neck: No JVD, no carotid bruits, no thyromegaly, no lymphadenopathy. Lungs:Clear bilaterally, no wheezes, rhonci, crackles Cardiovascular: Regular rate and rhythm. No murmurs, gallops or rubs. Abdomen:Soft. Bowel sounds present. Non-tender.  Extremities: No lower extremity edema. Pulses are 2 + in the bilateral DP/PT.  Stress myoview 01/13/13: Stress Procedure: The patient received IV Lexiscan 0.4 mg over 15-seconds. Technetium 94m Sestamibi injected at 30-seconds. Quantitative spect images were obtained after a 45 minute delay. During the infusion of Lexiscan, the patient complained of  a "strange" feeling and a headache. These symptoms resolved in recovery.  Stress ECG: No significant change from baseline ECG  QPS  Raw Data Images: Normal; no motion artifact; normal heart/lung ratio.  Stress Images: There is decreased uptake in the inferior wall.  Rest Images: There is decreased uptake in the inferior wall.  Subtraction (SDS): mixed infarct and ischemia  Transient Ischemic Dilatation (Normal <1.22): 0.98  Lung/Heart Ratio (Normal <0.45): 0.40  Quantitative Gated Spect Images  QGS EDV: 126 ml  QGS ESV: 54 ml  Impression  Exercise Capacity: Lexiscan with no exercise.  BP Response: Normal blood pressure response.  Clinical Symptoms: Headache  ECG Impression: No significant ST segment change suggestive of ischemia.  Comparison with Prior Nuclear Study: No images to compare  Overall Impression: Low risk stress nuclear study Small inferior wall infarct from apex to base with mild peri infarct ischemia.  LV Ejection Fraction: 57%. LV Wall Motion: NL LV Function; NL Wall Motion  Cardiac cath 01/27/13: Left main: No obstructive disease.  Left Anterior Descending Artery: Large caliber vessel in the proximal segment with trifurcation in the mid segment into a large septal perforating branch, moderate caliber bifurcating diagonal branch and small to moderate caliber continuation of the LAD. There appears to be a 40-50% stenosis at the trifurcation in the mid LAD. The diagonal branch bifurcates. The superior branch of the diagonal after the bifurcation is small in caliber with 70% stenosis.  Circumflex Artery: Large caliber vessel with large caliber obtuse marginal branch. The proximal and mid vessel has diffuse 30% stenosis, unchanged. The obtuse marginal branch has diffuse 30% stenosis in the proximal segment of the vessel.  Right Coronary Artery: Large caliber dominant vessel with patent proximal and mid stented segment with 20% stent restenosis. Mild plaque in the large caliber PDA.    EKG: NSR, rate 85 bpm. RBBB  Assessment  and Plan:   1. CORONARY ARTERY DISEASE: Stable. He is known to have moderate LAD stenosis and patent stents RCA by cath January 2015. Stress myoview with inferior infarct involving entire inferior wall with peri-infarct ischemia. Cath January 2015 with stable disease. Imdur has been added and he has been doing better. No changes today. Continue Plavix, beta blocker, statin, Imdur. No ASA since he is also on coumadin.   2. Paroxysmal atrial fibrillation: He is maintaining NSR. He did not want to start Xarelto due to cost. He has been maintained on coumadin therapy. Will continue Toprol at current dose.   3. Sleep apnea: He snores loudly at night, has excessive daytime fatigue with somnolence but trouble sleeping at night. Referral to Pulmonary pending.

## 2013-03-04 ENCOUNTER — Ambulatory Visit: Payer: Medicare Other

## 2013-03-05 ENCOUNTER — Ambulatory Visit: Payer: Medicare Other | Admitting: Internal Medicine

## 2013-03-15 ENCOUNTER — Ambulatory Visit (INDEPENDENT_AMBULATORY_CARE_PROVIDER_SITE_OTHER): Payer: Medicare Other | Admitting: General Practice

## 2013-03-15 ENCOUNTER — Encounter: Payer: Self-pay | Admitting: Family Medicine

## 2013-03-15 ENCOUNTER — Ambulatory Visit (INDEPENDENT_AMBULATORY_CARE_PROVIDER_SITE_OTHER): Payer: Medicare Other | Admitting: Family Medicine

## 2013-03-15 ENCOUNTER — Ambulatory Visit: Payer: Medicare Other

## 2013-03-15 VITALS — BP 136/72 | HR 90 | Temp 97.5°F | Wt 284.0 lb

## 2013-03-15 DIAGNOSIS — Z5181 Encounter for therapeutic drug level monitoring: Secondary | ICD-10-CM | POA: Insufficient documentation

## 2013-03-15 DIAGNOSIS — Z7901 Long term (current) use of anticoagulants: Secondary | ICD-10-CM

## 2013-03-15 DIAGNOSIS — G2581 Restless legs syndrome: Secondary | ICD-10-CM

## 2013-03-15 DIAGNOSIS — I4891 Unspecified atrial fibrillation: Secondary | ICD-10-CM

## 2013-03-15 DIAGNOSIS — I480 Paroxysmal atrial fibrillation: Secondary | ICD-10-CM

## 2013-03-15 LAB — POCT INR: INR: 1.9

## 2013-03-15 MED ORDER — CLONAZEPAM 0.5 MG PO TABS: 0.5000 mg | ORAL_TABLET | Freq: Every day | ORAL | Status: DC

## 2013-03-15 NOTE — Progress Notes (Signed)
   Subjective:    Patient ID: Gary Yates., male    DOB: December 05, 1933, 80 y.o.   MRN: [id number]  Leg Pain    Long-standing history of restless leg syndrome. Already takes Mirapex 0.375 mg nightly and gabapentin. He initially saw a great improvement with Mirapex but has recently had tremendous difficulties with restless leg syndrome at night. He does not have any daytime symptoms. No significant caffeine use. No other alleviating factors. He also has history of peripheral neuropathy from his diabetes already takes amitriptyline and gabapentin as above.  His only real relief of symptoms is ambulating Past Medical History  Diagnosis Date  . Coronary artery disease     a. h/o Overlapping stents RCA;  b. 06/2011 Cath: patent stents, nonobs dzs, NL EF.  Marland Kitchen Hypertension   . Osteoarthritis     shoulder  . Restless leg   . Hyperlipidemia   . COPD (chronic obstructive pulmonary disease)   . Nephrolithiasis   . Dyspnea   . GERD (gastroesophageal reflux disease)   . Low testosterone   . SVT (supraventricular tachycardia)   . Atrial fibrillation   . DM (diabetes mellitus)     Type 2, peripheral neuropathy.  . Diabetic peripheral neuropathy    Past Surgical History  Procedure Laterality Date  . Cholecystectomy    . Cardiac catheterization  01/2013  . Coronary angioplasty  2004  . Back surgery  [place]  . Rotator cuff repair      reports that he quit smoking about 29 years ago. His smoking use included Cigarettes. He has a 30 pack-year smoking history. He has never used smokeless tobacco. He reports that he does not drink alcohol or use illicit drugs. family history includes Alzheimer's disease in his mother; Arthritis in his brother and sister; Coronary artery disease in some other family members; Heart disease in his brother, father, mother, and sister; Migraines in his daughter and father; Obesity in his sister and son; Sleep apnea in his son; Thyroid disease in his daughter; Ulcers in  his father. Allergies  Allergen Reactions  . Ace Inhibitors Other (See Comments)    cough  . Codeine Nausea Only and Rash       . Penicillins Rash      Review of Systems  Constitutional: Positive for fatigue.  Respiratory: Negative for cough and shortness of breath.   Neurological: Negative for dizziness and weakness.       Objective:   Physical Exam  Constitutional: He is oriented to person, place, and time. He appears well-developed and well-nourished.  Cardiovascular: Normal rate.   Pulmonary/Chest: Effort normal and breath sounds normal. No respiratory distress. He has no wheezes. He has no rales.  Musculoskeletal: He exhibits no edema.  Neurological: He is alert and oriented to person, place, and time. No cranial nerve deficit.          Assessment & Plan:  Restless leg syndrome. Continue Mirapex. Avoid caffeine. Add low-dose clonazepam 0.5 mg each bedtime.

## 2013-03-15 NOTE — Progress Notes (Signed)
Pre visit review using our clinic review tool, if applicable. No additional management support is needed unless otherwise documented below in the visit note. 

## 2013-03-15 NOTE — Patient Instructions (Signed)
Restless Legs Syndrome Restless legs syndrome is a movement disorder. It may also be called a sensori-motor disorder.  CAUSES  No one knows what specifically causes restless legs syndrome, but it tends to run in families. It is also more common in people with low iron, in pregnancy, in people who need dialysis, and those with nerve damage (neuropathy).Some medications may make restless legs syndrome worse.Those medications include drugs to treat high blood pressure, some heart conditions, nausea, colds, allergies, and depression. SYMPTOMS Symptoms include uncomfortable sensations in the legs. These leg sensations are worse during periods of inactivity or rest. They are also worse while sitting or lying down. Individuals that have the disorder describe sensations in the legs that feel like:  Pulling.  Drawing.  Crawling.  Worming.  Boring.  Tingling.  Pins and needles.  Prickling.  Pain. The sensations are usually accompanied by an overwhelming urge to move the legs. Sudden muscle jerks may also occur. Movement provides temporary relief from the discomfort. In rare cases, the arms may also be affected. Symptoms may interfere with going to sleep (sleep onset insomnia). Restless legs syndrome may also be related to periodic limb movement disorder (PLMD). PLMD is another more common motor disorder. It also causes interrupted sleep. The symptoms from PLMD usually occur most often when you are awake. TREATMENT  Treatment for restless legs syndrome is symptomatic. This means that the symptoms are treated.   Massage and cold compresses may provide temporary relief.  Walk, stretch, or take a cold or hot bath.  Get regular exercise and a good night's sleep.  Avoid caffeine, alcohol, nicotine, and medications that can make it worse.  Do activities that provide mental stimulation like discussions, needlework, and video games. These may be helpful if you are not able to walk or  stretch. Some medications are effective in relieving the symptoms. However, many of these medications have side effects. Ask your caregiver about medications that may help your symptoms. Correcting iron deficiency may improve symptoms for some patients. Document Released: 12/14/2001 Document Revised: 03/18/2011 Document Reviewed: 03/22/2010 Exit[employer] Patient Information 2014 [employer].  

## 2013-03-25 ENCOUNTER — Ambulatory Visit: Payer: Medicare Other | Admitting: Internal Medicine

## 2013-03-26 ENCOUNTER — Telehealth: Payer: Self-pay | Admitting: Family Medicine

## 2013-03-26 ENCOUNTER — Telehealth: Payer: Self-pay

## 2013-03-26 NOTE — Telephone Encounter (Signed)
Change Omeprazole to Protonix 40 mg once daily

## 2013-03-26 NOTE — Telephone Encounter (Signed)
Changed RX to Protonix

## 2013-03-26 NOTE — Telephone Encounter (Signed)
Patient Information:  Caller Name: Trammell  Phone: [phone]  Patient: Unnamed, Dazey  Gender: Male  DOB: 01/22/1953  Age: 80 Years  PCP: Carolann Littler [facility])  Office Follow Up:  Does the office need to follow up with this patient?: Yes  Instructions For The Office: Medication change/adjustment?  krs/can  RN Note:  Seen in office 03/15/13 for restless leg symptoms.  Given new Rx for clonazepam, and this is when his sleepiness started.  States it is very effective for his restless leg symptoms, but it is making him unable to function well.  States he is staggering, not able to control his movements well, and states he "will fall asleep at the drop of a hat."  States this is a marked change for him.  Wife is very concerned about fall risk.  Patient states Dr. Elease Hashimoto told him that if the clonazepam helped the restless leg symptoms, they might have to reduce his amytriptyline.  Per fatigue protocol, emergent symptoms denied; disposition See in office today" due to "taking a medication that could cause weakness."  No appts available in Epic; info to office for provider review/callback.  May reach patient at [phone].  krs/can  Symptoms  Reason For Call & Symptoms: fatigue, sleepiness  Reviewed Health History In EMR: Yes  Reviewed Medications In EMR: Yes  Reviewed Allergies In EMR: Yes  Reviewed Surgeries / Procedures: Yes  Date of Onset of Symptoms: 03/15/2013  Guideline(s) Used:  Weakness (Generalized) and Fatigue  Disposition Per Guideline:   See Today in Office  Reason For Disposition Reached:   Taking a medicine that could cause weakness (e.g., blood pressure medications, diuretics)  Advice Given:  N/A  Patient Will Follow Care Advice:  YES

## 2013-03-26 NOTE — Telephone Encounter (Signed)
[facility] are asking if can change Omeprazole to either Pantoprazole, Lansoprazole, Aciphex, or Dexilant. The Omeproazole may reduce the antiplatelet activity of Clopidogrel recently prescribed by Dr. Isaac Bliss.

## 2013-03-26 NOTE — Telephone Encounter (Signed)
Pt informed

## 2013-03-26 NOTE — Telephone Encounter (Signed)
Make sure he is not taking this medication during the day. Try reducing nighttime dosage to one half tablet

## 2013-03-31 ENCOUNTER — Other Ambulatory Visit: Payer: Self-pay | Admitting: General Practice

## 2013-03-31 ENCOUNTER — Other Ambulatory Visit: Payer: Self-pay | Admitting: Family Medicine

## 2013-03-31 ENCOUNTER — Institutional Professional Consult (permissible substitution): Payer: Medicare Other | Admitting: Pulmonary Disease

## 2013-03-31 ENCOUNTER — Telehealth: Payer: Self-pay | Admitting: Family Medicine

## 2013-03-31 MED ORDER — WARFARIN SODIUM 5 MG PO TABS: ORAL_TABLET | ORAL | Status: DC

## 2013-03-31 NOTE — Telephone Encounter (Signed)
Pt received a call form his pharmacy that his new rx for Pantoprazole 40mg  is ready. He already takes OTC Omeprazole. He questions if he needs to stop the Omeprazole or take both meds for GERD.

## 2013-03-31 NOTE — Telephone Encounter (Signed)
Would not take both.  Stop the omeprazole.

## 2013-04-01 ENCOUNTER — Other Ambulatory Visit: Payer: Self-pay | Admitting: Family Medicine

## 2013-04-01 NOTE — Telephone Encounter (Signed)
Pt informed

## 2013-04-23 ENCOUNTER — Encounter: Payer: Self-pay | Admitting: *Deleted

## 2013-04-26 ENCOUNTER — Ambulatory Visit: Payer: Medicare Other

## 2013-05-10 ENCOUNTER — Telehealth: Payer: Self-pay | Admitting: Family Medicine

## 2013-05-10 NOTE — Telephone Encounter (Signed)
Caller: Sammie/Patient; Phone: [phone]; Reason for Call: Patient states he is in the "donut hole" with insurance and [facility] has suggested to him to inquire about changing insulin to Relion; They have told him it is a substitute for his Lantus; It would be significantly cheaper for him.  Advised information will be sent for pcp review and advice.

## 2013-05-10 NOTE — Telephone Encounter (Signed)
Pt has appt on 5/15

## 2013-05-10 NOTE — Telephone Encounter (Signed)
Relion is a brand name.  The only equivalent for Lantus is Levemir.  [employer] does make an NPH insulin but this would not be the same as Lantus.  Bring him back in and we can review option of switching.

## 2013-05-21 ENCOUNTER — Ambulatory Visit (INDEPENDENT_AMBULATORY_CARE_PROVIDER_SITE_OTHER): Payer: Medicare Other | Admitting: Family Medicine

## 2013-05-21 ENCOUNTER — Encounter: Payer: Self-pay | Admitting: Family Medicine

## 2013-05-21 VITALS — BP 136/76 | HR 90 | Wt 271.0 lb

## 2013-05-21 DIAGNOSIS — I10 Essential (primary) hypertension: Secondary | ICD-10-CM

## 2013-05-21 DIAGNOSIS — E669 Obesity, unspecified: Secondary | ICD-10-CM

## 2013-05-21 DIAGNOSIS — G2581 Restless legs syndrome: Secondary | ICD-10-CM

## 2013-05-21 DIAGNOSIS — E1142 Type 2 diabetes mellitus with diabetic polyneuropathy: Secondary | ICD-10-CM

## 2013-05-21 DIAGNOSIS — E1140 Type 2 diabetes mellitus with diabetic neuropathy, unspecified: Secondary | ICD-10-CM

## 2013-05-21 DIAGNOSIS — E1165 Type 2 diabetes mellitus with hyperglycemia: Secondary | ICD-10-CM

## 2013-05-21 DIAGNOSIS — E1149 Type 2 diabetes mellitus with other diabetic neurological complication: Secondary | ICD-10-CM

## 2013-05-21 MED ORDER — PRAMIPEXOLE DIHYDROCHLORIDE 0.5 MG PO TABS: ORAL_TABLET | ORAL | Status: DC

## 2013-05-21 MED ORDER — PRAMIPEXOLE DIHYDROCHLORIDE 0.5 MG PO TABS: 0.5000 mg | ORAL_TABLET | Freq: Three times a day (TID) | ORAL | Status: DC

## 2013-05-21 NOTE — Progress Notes (Signed)
Pre visit review using our clinic review tool, if applicable. No additional management support is needed unless otherwise documented below in the visit note. 

## 2013-05-21 NOTE — Patient Instructions (Signed)
Schedule Medicare Wellness Visit May use Toujeo in place of Lantus- 50 units twice daily Schedule follow up with Dr Cruzita Lederer.

## 2013-05-21 NOTE — Progress Notes (Signed)
Subjective:    Patient ID: Gary Tester., male    DOB: [birth year], 80 y.o.   MRN: [id number]  HPI Multiple medical problems. History of CAD, type 2 diabetes with comp case of neuropathy, hypertension, peripheral vascular disease, atrial fibrillation, hyperlipidemia, chronic kidney disease, GERD, restless leg syndrome  Type 2 diabetes and has had one visit with endocrinologist. Needs followup with endocrionolgy. Having cost issues with Lantus. His insurance company has suggested Relion insulin and we explained this was not quite equivalent to Lantus. His blood sugars remain poorly controlled. He takes Lantus 50 units twice daily he is taking 20 units of NovoLog with meals. No recent hypoglycemia  Hypertension is been well controlled. No headaches. No dizziness. No recent chest pains.  Restless leg syndrome. We added Klonopin 0.5 mg each bedtime and he has seen some improvement with that. He also remains on gabapentin and Mirapex 0.5 mg nightly  Past Medical History  Diagnosis Date  . Coronary artery disease     a. h/o Overlapping stents RCA;  b. 06/2011 Cath: patent stents, nonobs dzs, NL EF.  Marland Kitchen Hypertension   . Osteoarthritis     shoulder  . Restless leg   . Hyperlipidemia   . COPD (chronic obstructive pulmonary disease)   . Nephrolithiasis   . Dyspnea   . GERD (gastroesophageal reflux disease)   . Low testosterone   . SVT (supraventricular tachycardia)   . Atrial fibrillation   . DM (diabetes mellitus)     Type 2, peripheral neuropathy.  . Diabetic peripheral neuropathy    Past Surgical History  Procedure Laterality Date  . Cholecystectomy    . Cardiac catheterization  01/2013  . Coronary angioplasty  2004  . Back surgery  [place]  . Rotator cuff repair      reports that he quit smoking about 30 years ago. His smoking use included Cigarettes. He has a 30 pack-year smoking history. He has never used smokeless tobacco. He reports that he does not drink alcohol or use  illicit drugs. family history includes Alzheimer's disease in his mother; Arthritis in his brother and sister; Coronary artery disease in some other family members; Heart disease in his brother, father, mother, and sister; Migraines in his daughter and father; Obesity in his sister and son; Sleep apnea in his son; Thyroid disease in his daughter; Ulcers in his father. Allergies  Allergen Reactions  . Ace Inhibitors Other (See Comments)    cough  . Codeine Nausea Only and Rash       . Penicillins Rash      Review of Systems  Constitutional: Positive for fatigue. Negative for appetite change and unexpected weight change.  Eyes: Negative for visual disturbance.  Respiratory: Negative for cough, chest tightness and shortness of breath.   Cardiovascular: Negative for chest pain and palpitations.  Genitourinary: Negative for dysuria.  Neurological: Negative for dizziness, syncope, weakness, light-headedness and headaches.       Objective:   Physical Exam  Constitutional: He appears well-developed and well-nourished.  Neck: Neck supple. No thyromegaly present.  Cardiovascular: Normal rate.   Pulmonary/Chest: Effort normal and breath sounds normal. No respiratory distress. He has no wheezes. He has no rales.  Musculoskeletal: He exhibits no edema.  Psychiatric: He has a normal mood and affect. His behavior is normal.          Assessment & Plan:  #1 type 2 diabetes. He is strongly encouraged to schedule followup with endocrinology. He is basically running out  of Lantus and we provided samples of Toujeo to use in place until he can get Lantus or make possible changes to Relion.  We'll defer to endocrinology #2 hypertension which is well-controlled #3 restless leg syndrome. Mirapex 0.5 mg each bedtime. Continue low-dose Klonopin as needed. Avoidance of caffeine late in the day

## 2013-05-24 ENCOUNTER — Telehealth: Payer: Self-pay | Admitting: Family Medicine

## 2013-05-24 NOTE — Telephone Encounter (Signed)
Caller: Irish/Patient; Phone: [phone]; Reason for Call: [employer] has a full page spread in [place] about a new breakthrough machine for Neuropathy.  Pt has called to get more information but would like Dr Erick Blinks advice if he feels this is worth a shot or a gimmick.  Pt was told this is a machine that he would put his feet in and it stimulates the blood flow and they are finding pt's are getting relief from Neuropathy with these treatments.  The costs are $4500 and he would pay 20% copay with insurance for 6 month rental of the machine at home.  He would be able to try the machine first in the office at a consultation visit.  Please follow up with pt with Dr Erick Blinks thoughts on this.

## 2013-05-24 NOTE — Telephone Encounter (Signed)
[employer] wanted to inform you that patient is taking warfarin (COUMADIN) 5 MG tablet  and clopidogrel (PLAVIX) 75 MG tablet

## 2013-05-26 NOTE — Telephone Encounter (Signed)
Pt informed

## 2013-05-26 NOTE — Telephone Encounter (Signed)
Left message for patient to return call.

## 2013-05-26 NOTE — Telephone Encounter (Signed)
i will have to research further.  I am not aware of any good evidence that these treatments work but will have to research

## 2013-05-28 ENCOUNTER — Other Ambulatory Visit: Payer: Self-pay | Admitting: Family Medicine

## 2013-06-07 ENCOUNTER — Other Ambulatory Visit: Payer: Self-pay | Admitting: Family Medicine

## 2013-06-16 ENCOUNTER — Encounter: Payer: Self-pay | Admitting: Pulmonary Disease

## 2013-06-16 ENCOUNTER — Encounter: Payer: Self-pay | Admitting: Gastroenterology

## 2013-06-25 ENCOUNTER — Encounter: Payer: Medicare Other | Admitting: Family Medicine

## 2013-07-13 ENCOUNTER — Telehealth: Payer: Self-pay | Admitting: Family Medicine

## 2013-07-13 MED ORDER — INSULIN ASPART 100 UNIT/ML FLEXPEN: PEN_INJECTOR | SUBCUTANEOUS | Status: DC

## 2013-07-13 NOTE — Telephone Encounter (Signed)
Pt is re Gary Yates to speak directly with you regarding his insulin.

## 2013-07-13 NOTE — Telephone Encounter (Signed)
Spoke with patient. Patient needed Samples of Toujeo and Novolog.

## 2013-08-04 ENCOUNTER — Encounter: Payer: Self-pay | Admitting: Family Medicine

## 2013-08-04 ENCOUNTER — Ambulatory Visit (INDEPENDENT_AMBULATORY_CARE_PROVIDER_SITE_OTHER): Payer: Medicare Other | Admitting: Family Medicine

## 2013-08-04 ENCOUNTER — Ambulatory Visit (INDEPENDENT_AMBULATORY_CARE_PROVIDER_SITE_OTHER): Payer: Medicare Other | Admitting: Family

## 2013-08-04 VITALS — BP 136/70 | HR 78 | Temp 98.0°F | Ht 69.0 in | Wt 266.0 lb

## 2013-08-04 DIAGNOSIS — E1165 Type 2 diabetes mellitus with hyperglycemia: Secondary | ICD-10-CM

## 2013-08-04 DIAGNOSIS — I4891 Unspecified atrial fibrillation: Secondary | ICD-10-CM

## 2013-08-04 DIAGNOSIS — E1142 Type 2 diabetes mellitus with diabetic polyneuropathy: Secondary | ICD-10-CM

## 2013-08-04 DIAGNOSIS — I10 Essential (primary) hypertension: Secondary | ICD-10-CM

## 2013-08-04 DIAGNOSIS — I480 Paroxysmal atrial fibrillation: Secondary | ICD-10-CM

## 2013-08-04 DIAGNOSIS — Z5181 Encounter for therapeutic drug level monitoring: Secondary | ICD-10-CM

## 2013-08-04 DIAGNOSIS — E1149 Type 2 diabetes mellitus with other diabetic neurological complication: Secondary | ICD-10-CM

## 2013-08-04 DIAGNOSIS — Z23 Encounter for immunization: Secondary | ICD-10-CM

## 2013-08-04 DIAGNOSIS — E785 Hyperlipidemia, unspecified: Secondary | ICD-10-CM

## 2013-08-04 DIAGNOSIS — N183 Chronic kidney disease, stage 3 unspecified: Secondary | ICD-10-CM

## 2013-08-04 DIAGNOSIS — E1140 Type 2 diabetes mellitus with diabetic neuropathy, unspecified: Secondary | ICD-10-CM

## 2013-08-04 DIAGNOSIS — Z0000 Encounter for general adult medical examination without abnormal findings: Secondary | ICD-10-CM

## 2013-08-04 LAB — BASIC METABOLIC PANEL
BUN: 23 mg/dL (ref 6–23)
CHLORIDE: 100 meq/L (ref 96–112)
CO2: 27 meq/L (ref 19–32)
Calcium: 9.6 mg/dL (ref 8.4–10.5)
Creatinine, Ser: 1.3 mg/dL (ref 0.4–1.5)
GFR: 54.72 mL/min — ABNORMAL LOW (ref >60.00)
Glucose, Bld: 234 mg/dL — ABNORMAL HIGH (ref 70–99)
Potassium: 4.1 mEq/L (ref 3.5–5.1)
SODIUM: 134 meq/L — AB (ref 135–145)

## 2013-08-04 LAB — HEMOGLOBIN A1C: Hgb A1c MFr Bld: 11.9 % — ABNORMAL HIGH (ref 4.6–6.5)

## 2013-08-04 LAB — POCT INR: INR: 1.5

## 2013-08-04 NOTE — Progress Notes (Signed)
Subjective:    Patient ID: Gary Higgs., male    DOB: 1933-06-24, 80 y.o.   MRN: [id number]  HPI Patient seen for Medicare wellness exam and medical followup. He has multiple chronic problems including obesity, type 2 diabetes, CAD, hypertension, atrial fibrillation, dyslipidemia, COPD, chronic kidney disease stage III, chronic peripheral neuropathy, chronic insomnia.  Continues to have insomnia issues with his restless leg syndrome as well as chronic neuropathy pain which has been poorly controlled. Poorly controlled diabetes. Poor history of compliance. He takes high-dose gabapentin which helps somewhat. Neuropathy severity correlates with poor control diabetes. No recent chest pains. He has also taken Elavil in past for his neuropathy in the past. He does not recall if he has been on Cymbalta previously  No history of shingles vaccine. No contraindications. No history of Prevnar 13. He had tubular adenoma on colonoscopy December 2010. Tetanus up-to-date  Past Medical History  Diagnosis Date  . Coronary artery disease     a. h/o Overlapping stents RCA;  b. 06/2011 Cath: patent stents, nonobs dzs, NL EF.  Marland Kitchen Hypertension   . Osteoarthritis     shoulder  . Restless leg   . Hyperlipidemia   . COPD (chronic obstructive pulmonary disease)   . Nephrolithiasis   . Dyspnea   . GERD (gastroesophageal reflux disease)   . Low testosterone   . SVT (supraventricular tachycardia)   . Atrial fibrillation   . DM (diabetes mellitus)     Type 2, peripheral neuropathy.  . Diabetic peripheral neuropathy    Past Surgical History  Procedure Laterality Date  . Cholecystectomy    . Cardiac catheterization  01/2013  . Coronary angioplasty  2004  . Back surgery  [place]  . Rotator cuff repair      reports that he quit smoking about 30 years ago. His smoking use included Cigarettes. He has a 30 pack-year smoking history. He has never used smokeless tobacco. He reports that he does not drink  alcohol or use illicit drugs. family history includes Alzheimer's disease in his mother; Arthritis in his brother and sister; Coronary artery disease in some other family members; Heart disease in his brother, father, mother, and sister; Migraines in his daughter and father; Obesity in his sister and son; Sleep apnea in his son; Thyroid disease in his daughter; Ulcers in his father. Allergies  Allergen Reactions  . Ace Inhibitors Other (See Comments)    cough  . Codeine Nausea Only and Rash       . Penicillins Rash   1.  Risk factors based on Past Medical , Social, and Family history reviewed and as indicated above with no changes 2.  Limitations in physical activities None.  No recent falls. 3.  Depression/mood No active depression or anxiety issues 4.  Hearing No defiits 5.  ADLs independent in all. 6.  Cognitive function (orientation to time and place, language, writing, speech,memory) no short or long term memory issues.  Language and judgement intact. 7.  Home Safety no issues 8.  Height, weight, and visual acuity.all stable. 9.  Counseling discussed need for weight loss and increased compliance with diet and more frequent monitoring of blood sugars. 10. Recommendation of preventive services. Prevnar 13 and Shingles vaccines. 11. Labs based on risk factors A1C 12. Care Plan immunizations discussed and reminder for yearly flu vaccine.   13. Other Providers Dr Angelena Form (Cardiology) 14. Written schedule of screening/prevention services given to patient.    Review of Systems  Constitutional:  Positive for fatigue. Negative for fever, activity change and appetite change.  HENT: Negative for congestion, ear pain and trouble swallowing.   Eyes: Negative for pain and visual disturbance.  Respiratory: Negative for cough, shortness of breath and wheezing.   Cardiovascular: Negative for chest pain and palpitations.  Gastrointestinal: Negative for nausea, vomiting, abdominal pain, diarrhea,  constipation, blood in stool, abdominal distention and rectal pain.  Endocrine: Negative for polydipsia and polyuria.  Genitourinary: Negative for dysuria, hematuria and testicular pain.  Musculoskeletal: Positive for arthralgias. Negative for joint swelling.  Skin: Negative for rash.  Neurological: Negative for dizziness, syncope and headaches.  Hematological: Negative for adenopathy.  Psychiatric/Behavioral: Negative for confusion and dysphoric mood.       Objective:   Physical Exam  Constitutional: He is oriented to person, place, and time. He appears well-developed and well-nourished. No distress.  HENT:  Head: Normocephalic and atraumatic.  Right Ear: External ear normal.  Left Ear: External ear normal.  Mouth/Throat: Oropharynx is clear and moist.  Eyes: Conjunctivae and EOM are normal. Pupils are equal, round, and reactive to light.  Neck: Normal range of motion. Neck supple. No thyromegaly present.  Cardiovascular: Normal rate, regular rhythm and normal heart sounds.   No murmur heard. Pulmonary/Chest: No respiratory distress. He has no wheezes. He has no rales.  Abdominal: Soft. Bowel sounds are normal. He exhibits no distension and no mass. There is no tenderness. There is no rebound and no guarding.  Musculoskeletal: He exhibits no edema.  Lymphadenopathy:    He has no cervical adenopathy.  Neurological: He is alert and oriented to person, place, and time. He displays normal reflexes. No cranial nerve deficit.  Skin: No rash noted.  Psychiatric: He has a normal mood and affect.          Assessment & Plan:  #1 complete physical. Patient struggled with obesity for many years. Had long discussion regarding importance of weight loss. Prevnar 13 vaccine given along with shingles vaccine after discussion of risks and benefits. Continue yearly flu vaccine. Tetanus booster in 2 years. We'll need repeat colonoscopy later this year #2 type 2 diabetes. Poorly controlled with  long hx of poor complaince. Previously has seen endocrinology but has refused to go back. Not checking blood sugars regularly which makes modification of his current insulin regimen very difficult. Recheck A1c #3 hypertension stable #4 history of dyslipidemia. Recent lipids from January reviewed

## 2013-08-04 NOTE — Progress Notes (Signed)
Pre visit review using our clinic review tool, if applicable. No additional management support is needed unless otherwise documented below in the visit note. 

## 2013-08-04 NOTE — Patient Instructions (Signed)
Take 2 tablets today and 1.5 tablets tomorrow then continue to take 1 tablet daily except 1 1/2 tablets on Wednesday. Re-check in 6 weeks.   Anticoagulation Dose Instructions as of 08/04/2013     Gary Yates Tue Wed Thu Fri Sat   New Dose 5 mg 5 mg 5 mg 7.5 mg 5 mg 5 mg 5 mg    Description       Take 2 tablets today and 1.5 tablets tomorrow then continue to take 1 tablet daily except 1 1/2 tablets on Wednesday. Re-check in 6 weeks.

## 2013-08-04 NOTE — Patient Instructions (Signed)
Continue with yearly flu vaccine Try to lose some weight. Check blood sugars more frequently and bring back log of blood sugar readings next visit for review Vary times of day that you check blood sugar and include fastings and also some pre-and post meals blood sugars, as discussed You'll need repeat tetanus vaccine in 2 years

## 2013-08-05 LAB — HM DIABETES FOOT EXAM

## 2013-08-07 ENCOUNTER — Other Ambulatory Visit: Payer: Self-pay | Admitting: Family Medicine

## 2013-08-13 ENCOUNTER — Telehealth: Payer: Self-pay | Admitting: Family Medicine

## 2013-08-13 NOTE — Telephone Encounter (Signed)
Pt would like samples of novolog flex pen

## 2013-08-13 NOTE — Telephone Encounter (Signed)
Pt aware that 2 samples are ready for pick up

## 2013-08-21 ENCOUNTER — Other Ambulatory Visit: Payer: Self-pay | Admitting: Family Medicine

## 2013-08-31 ENCOUNTER — Ambulatory Visit: Payer: Medicare Other | Admitting: Cardiovascular Disease

## 2013-09-15 ENCOUNTER — Ambulatory Visit: Payer: Medicare Other | Admitting: Family

## 2013-09-20 ENCOUNTER — Ambulatory Visit: Payer: Medicare Other | Admitting: Family

## 2013-09-27 ENCOUNTER — Telehealth: Payer: Self-pay | Admitting: Family Medicine

## 2013-09-27 MED ORDER — PRAMIPEXOLE DIHYDROCHLORIDE 1.5 MG PO TABS: 1.5000 mg | ORAL_TABLET | Freq: Every day | ORAL | Status: DC

## 2013-09-27 NOTE — Telephone Encounter (Signed)
Pt states his restless leg syndrome is really been acting up lately.  Pt has had to take more pramipexole (MIRAPEX) 0.5 MG tablet.  Pt has been having trouble sleeping too and he has had to take more  (1.5 tabs/nite) Pt now needs new rx and would like to know if dr will increase this rx to 1.5 /nite. Or is there something that will work better. [facility] [address]

## 2013-09-27 NOTE — Telephone Encounter (Signed)
OK to refill and increase to 1.5 tablets qhs.

## 2013-09-27 NOTE — Telephone Encounter (Signed)
Rx sent to pharmacy   

## 2013-10-05 ENCOUNTER — Ambulatory Visit: Payer: Medicare Other | Admitting: Family

## 2013-10-06 ENCOUNTER — Encounter: Payer: Self-pay | Admitting: Gastroenterology

## 2013-10-06 ENCOUNTER — Telehealth: Payer: Self-pay | Admitting: Family Medicine

## 2013-10-06 NOTE — Telephone Encounter (Signed)
Pt is aware that Samples are ready for pick up

## 2013-10-06 NOTE — Telephone Encounter (Signed)
Pt would like samples of novolog and toujeo

## 2013-10-14 ENCOUNTER — Telehealth: Payer: Self-pay | Admitting: Family

## 2013-10-14 NOTE — Telephone Encounter (Signed)
Please call and schedule appt for PT/INR check. Overdue

## 2013-10-15 NOTE — Telephone Encounter (Signed)
S/w pt said he is scheduled for 11/04/13 and he is not coming before then .Marland Kitchen

## 2013-11-04 ENCOUNTER — Ambulatory Visit (INDEPENDENT_AMBULATORY_CARE_PROVIDER_SITE_OTHER): Payer: Medicare Other | Admitting: Family Medicine

## 2013-11-04 ENCOUNTER — Ambulatory Visit (INDEPENDENT_AMBULATORY_CARE_PROVIDER_SITE_OTHER): Payer: Medicare Other | Admitting: Family

## 2013-11-04 ENCOUNTER — Encounter: Payer: Self-pay | Admitting: Family Medicine

## 2013-11-04 ENCOUNTER — Ambulatory Visit: Payer: Medicare Other

## 2013-11-04 VITALS — BP 130/78 | HR 84 | Temp 98.0°F | Wt 258.0 lb

## 2013-11-04 DIAGNOSIS — G629 Polyneuropathy, unspecified: Secondary | ICD-10-CM

## 2013-11-04 DIAGNOSIS — E1165 Type 2 diabetes mellitus with hyperglycemia: Principal | ICD-10-CM

## 2013-11-04 DIAGNOSIS — I10 Essential (primary) hypertension: Secondary | ICD-10-CM

## 2013-11-04 DIAGNOSIS — Z23 Encounter for immunization: Secondary | ICD-10-CM

## 2013-11-04 DIAGNOSIS — I480 Paroxysmal atrial fibrillation: Secondary | ICD-10-CM

## 2013-11-04 DIAGNOSIS — G2581 Restless legs syndrome: Secondary | ICD-10-CM

## 2013-11-04 DIAGNOSIS — E1140 Type 2 diabetes mellitus with diabetic neuropathy, unspecified: Secondary | ICD-10-CM

## 2013-11-04 DIAGNOSIS — E785 Hyperlipidemia, unspecified: Secondary | ICD-10-CM

## 2013-11-04 DIAGNOSIS — G4700 Insomnia, unspecified: Secondary | ICD-10-CM

## 2013-11-04 DIAGNOSIS — Z5181 Encounter for therapeutic drug level monitoring: Secondary | ICD-10-CM

## 2013-11-04 LAB — BASIC METABOLIC PANEL
BUN: 19 mg/dL (ref 6–23)
CHLORIDE: 97 meq/L (ref 96–112)
CO2: 24 meq/L (ref 19–32)
Calcium: 9.7 mg/dL (ref 8.4–10.5)
Creatinine, Ser: 1.4 mg/dL (ref 0.4–1.5)
GFR: 51.56 mL/min — ABNORMAL LOW (ref >60.00)
Glucose, Bld: 362 mg/dL — ABNORMAL HIGH (ref 70–99)
POTASSIUM: 4.1 meq/L (ref 3.5–5.1)
Sodium: 133 mEq/L — ABNORMAL LOW (ref 135–145)

## 2013-11-04 LAB — POCT INR: INR: 2.5

## 2013-11-04 LAB — LIPID PANEL
CHOL/HDL RATIO: 7
Cholesterol: 211 mg/dL — ABNORMAL HIGH (ref 0–200)
HDL: 28.8 mg/dL — ABNORMAL LOW (ref >39.00)
NONHDL: 182.2
TRIGLYCERIDES: 445 mg/dL — AB (ref 0.0–149.0)
VLDL: 89 mg/dL — AB (ref 0.0–40.0)

## 2013-11-04 LAB — HEMOGLOBIN A1C: HEMOGLOBIN A1C: 13.5 % — AB (ref 4.6–6.5)

## 2013-11-04 LAB — HEPATIC FUNCTION PANEL
ALBUMIN: 3.5 g/dL (ref 3.5–5.2)
ALT: 14 U/L (ref 0–53)
AST: 28 U/L (ref 0–37)
Alkaline Phosphatase: 91 U/L (ref 39–117)
Bilirubin, Direct: 0.1 mg/dL (ref 0.0–0.3)
Total Bilirubin: 0.7 mg/dL (ref 0.2–1.2)
Total Protein: 7.5 g/dL (ref 6.0–8.3)

## 2013-11-04 LAB — LDL CHOLESTEROL, DIRECT: LDL DIRECT: 99.9 mg/dL

## 2013-11-04 MED ORDER — CLONAZEPAM 1 MG PO TABS: 1.0000 mg | ORAL_TABLET | Freq: Every day | ORAL | Status: DC

## 2013-11-04 MED ORDER — CANAGLIFLOZIN 100 MG PO TABS: 100.0000 mg | ORAL_TABLET | Freq: Every day | ORAL | Status: DC

## 2013-11-04 NOTE — Progress Notes (Signed)
Pre visit review using our clinic review tool, if applicable. No additional management support is needed unless otherwise documented below in the visit note. 

## 2013-11-04 NOTE — Patient Instructions (Signed)
Insomnia Insomnia is frequent trouble falling and/or staying asleep. Insomnia can be a long term problem or a short term problem. Both are common. Insomnia can be a short term problem when the wakefulness is related to a certain stress or worry. Long term insomnia is often related to ongoing stress during waking hours and/or poor sleeping habits. Overtime, sleep deprivation itself can make the problem worse. Every little thing feels more severe because you are overtired and your ability to cope is decreased. CAUSES   Stress, anxiety, and depression.  Poor sleeping habits.  Distractions such as TV in the bedroom.  Naps close to bedtime.  Engaging in emotionally charged conversations before bed.  Technical reading before sleep.  Alcohol and other sedatives. They may make the problem worse. They can hurt normal sleep patterns and normal dream activity.  Stimulants such as caffeine for several hours prior to bedtime.  Pain syndromes and shortness of breath can cause insomnia.  Exercise late at night.  Changing time zones may cause sleeping problems (jet lag). It is sometimes helpful to have someone observe your sleeping patterns. They should look for periods of not breathing during the night (sleep apnea). They should also look to see how long those periods last. If you live alone or observers are uncertain, you can also be observed at a sleep clinic where your sleep patterns will be professionally monitored. Sleep apnea requires a checkup and treatment. Give your caregivers your medical history. Give your caregivers observations your family has made about your sleep.  SYMPTOMS   Not feeling rested in the morning.  Anxiety and restlessness at bedtime.  Difficulty falling and staying asleep. TREATMENT   Your caregiver may prescribe treatment for an underlying medical disorders. Your caregiver can give advice or help if you are using alcohol or other drugs for self-medication. Treatment  of underlying problems will usually eliminate insomnia problems.  Medications can be prescribed for short time use. They are generally not recommended for lengthy use.  Over-the-counter sleep medicines are not recommended for lengthy use. They can be habit forming.  You can promote easier sleeping by making lifestyle changes such as:  Using relaxation techniques that help with breathing and reduce muscle tension.  Exercising earlier in the day.  Changing your diet and the time of your last meal. No night time snacks.  Establish a regular time to go to bed.  Counseling can help with stressful problems and worry.  Soothing music and white noise may be helpful if there are background noises you cannot remove.  Stop tedious detailed work at least one hour before bedtime. HOME CARE INSTRUCTIONS   Keep a diary. Inform your caregiver about your progress. This includes any medication side effects. See your caregiver regularly. Take note of:  Times when you are asleep.  Times when you are awake during the night.  The quality of your sleep.  How you feel the next day. This information will help your caregiver care for you.  Get out of bed if you are still awake after 15 minutes. Read or do some quiet activity. Keep the lights down. Wait until you feel sleepy and go back to bed.  Keep regular sleeping and waking hours. Avoid naps.  Exercise regularly.  Avoid distractions at bedtime. Distractions include watching television or engaging in any intense or detailed activity like attempting to balance the household checkbook.  Develop a bedtime ritual. Keep a familiar routine of bathing, brushing your teeth, climbing into bed at the same   time each night, listening to soothing music. Routines increase the success of falling to sleep faster.  Use relaxation techniques. This can be using breathing and muscle tension release routines. It can also include visualizing peaceful scenes. You can  also help control troubling or intruding thoughts by keeping your mind occupied with boring or repetitive thoughts like the old concept of counting sheep. You can make it more creative like imagining planting one beautiful flower after another in your backyard garden.  During your day, work to eliminate stress. When this is not possible use some of the previous suggestions to help reduce the anxiety that accompanies stressful situations. MAKE SURE YOU:   Understand these instructions.  Will watch your condition.  Will get help right away if you are not doing well or get worse. Document Released: 12/22/1999 Document Revised: 03/18/2011 Document Reviewed: 01/21/2007 Exit[employer] Patient Information 2015 [employer]. This information is not intended to replace advice given to you by your health care provider. Make sure you discuss any questions you have with your health care provider.  

## 2013-11-04 NOTE — Progress Notes (Signed)
Subjective:    Patient ID: Gary Yates., male    DOB: 03-Sep-1933, 80 y.o.   MRN: [id number]  HPI Patient seen for medical follow-up. He has multiple chronic problems including obesity, poorly controlled type 2 diabetes with peripheral neuropathy, restless leg syndrome, CAD, hypertension, atrial fibrillation, dyslipidemia, COPD, GERD, chronic kidney disease.  He's lost some weight since last visit due to his efforts. Type 2 diabetes with poor control. He does not bring any sugars for review at this time but apparently has consistent readings over 150. He takes high-dose insulin. No reported hypoglycemia. He has impaired renal function so not a candidate for metformin.  He has history of restless leg syndrome. This has improved with increasing Mirapex. He still has severe insomnia and some nights only sleeps 2 or 3 hours. Takes low-dose Klonopin 0.5 mg. He has refused Cymbalta for neuropathy treatment. He still has significant neuropathy pains which sometimes impairs sleep.  Frequency feels off balance and high risk for falls. Largely due to his peripheral neuropathy issues.  Past Medical History  Diagnosis Date  . Coronary artery disease     a. h/o Overlapping stents RCA;  b. 06/2011 Cath: patent stents, nonobs dzs, NL EF.  Marland Kitchen Hypertension   . Osteoarthritis     shoulder  . Restless leg   . Hyperlipidemia   . COPD (chronic obstructive pulmonary disease)   . Nephrolithiasis   . Dyspnea   . GERD (gastroesophageal reflux disease)   . Low testosterone   . SVT (supraventricular tachycardia)   . Atrial fibrillation   . DM (diabetes mellitus)     Type 2, peripheral neuropathy.  . Diabetic peripheral neuropathy    Past Surgical History  Procedure Laterality Date  . Cholecystectomy    . Cardiac catheterization  01/2013  . Coronary angioplasty  2004  . Back surgery  [place]  . Rotator cuff repair      reports that he quit smoking about 30 years ago. His smoking use included  Cigarettes. He has a 30 pack-year smoking history. He has never used smokeless tobacco. He reports that he does not drink alcohol or use illicit drugs. family history includes Alzheimer's disease in his mother; Arthritis in his brother and sister; Coronary artery disease in some other family members; Heart disease in his brother, father, mother, and sister; Migraines in his daughter and father; Obesity in his sister and son; Sleep apnea in his son; Thyroid disease in his daughter; Ulcers in his father. Allergies  Allergen Reactions  . Ace Inhibitors Other (See Comments)    cough  . Codeine Nausea Only and Rash       . Penicillins Rash      Review of Systems  Constitutional: Negative for fatigue.  Eyes: Negative for visual disturbance.  Respiratory: Negative for cough, chest tightness and shortness of breath.   Cardiovascular: Negative for chest pain, palpitations and leg swelling.  Genitourinary:       Patient has occasional slow stream and difficulty starting his urination. Some nocturia.  Neurological: Negative for dizziness, syncope, weakness, light-headedness and headaches.  Psychiatric/Behavioral: Positive for sleep disturbance.       Objective:   Physical Exam  Constitutional: He appears well-developed and well-nourished.  Neck: Neck supple.  Cardiovascular: Normal rate and regular rhythm.   Pulmonary/Chest: Effort normal and breath sounds normal. No respiratory distress. He has no wheezes. He has no rales.  Musculoskeletal: He exhibits no edema.  Neurological: He is alert.  Skin:  Patient has impairment monofilament testing throughout. No callus formation. No active ulcers.  Psychiatric: He has a normal mood and affect. His behavior is normal.          Assessment & Plan:  #1 poorly controlled type 2 diabetes with peripheral neuropathy and chronic kidney disease. We have again discussed possible referral to endocrinologist but he is somewhat reluctant. We've  recommended addition of Invokana 100 mg once daily. He has adequate renal function from previous labs to start this. Recheck A1c today. Routine follow-up 3 months #2 chronic insomnia. Sleep hygiene discussed. Avoid TV other bright lights at night. Increase Klonopin to 1 mg daily at bedtime. Handout given #3 history of peripheral neuropathy with high risk for falls. He is encouraged to exercise more regularly. He is also encouraged to use a cane or other supportive advice at night when he gets up #4 history of CAD and dyslipidemia. Check lipid and hepatic panel #5 chronic kidney disease. Recheck basic metabolic panel #6 probable BPH. We forgot to check his prostate exam. His symptoms are suggestive of BPH-related obstructive uropathy. Consider trial of Flomax

## 2013-11-04 NOTE — Patient Instructions (Addendum)
   Continue to take 1 tablet daily except 1 1/2 tablets on Wednesday. Re-check in 6 weeks.  Anticoagulation Dose Instructions as of 11/04/2013     Gary Yates Tue Wed Thu Fri Sat   New Dose 5 mg 5 mg 5 mg 7.5 mg 5 mg 5 mg 5 mg    Description       Continue to take 1 tablet daily except 1 1/2 tablets on Wednesday. Re-check in 6 weeks.

## 2013-11-05 ENCOUNTER — Telehealth: Payer: Self-pay | Admitting: Family Medicine

## 2013-11-05 NOTE — Telephone Encounter (Signed)
Pt would like samples of novolog

## 2013-11-05 NOTE — Telephone Encounter (Signed)
emmi mailed  °

## 2013-11-05 NOTE — Telephone Encounter (Signed)
Pt aware that samples are ready for pick up  

## 2013-11-10 ENCOUNTER — Other Ambulatory Visit: Payer: Self-pay | Admitting: Family Medicine

## 2013-11-12 ENCOUNTER — Ambulatory Visit: Payer: Medicare Other | Admitting: Cardiovascular Disease

## 2013-11-29 ENCOUNTER — Other Ambulatory Visit: Payer: Self-pay | Admitting: Family Medicine

## 2013-12-16 ENCOUNTER — Ambulatory Visit: Payer: Medicare Other | Admitting: Family

## 2013-12-16 ENCOUNTER — Encounter (HOSPITAL_COMMUNITY): Payer: Self-pay | Admitting: Cardiology

## 2013-12-30 ENCOUNTER — Telehealth: Payer: Self-pay | Admitting: Family Medicine

## 2013-12-30 NOTE — Telephone Encounter (Signed)
Pt needs samples of toujeo and novolog 2 each per pt.

## 2013-12-30 NOTE — Telephone Encounter (Signed)
Pt aware that samples are ready for pick up  

## 2014-01-05 ENCOUNTER — Other Ambulatory Visit: Payer: Self-pay | Admitting: Family Medicine

## 2014-01-17 ENCOUNTER — Ambulatory Visit: Payer: Medicare Other | Admitting: Cardiovascular Disease

## 2014-01-19 ENCOUNTER — Ambulatory Visit (INDEPENDENT_AMBULATORY_CARE_PROVIDER_SITE_OTHER): Payer: Medicare Other | Admitting: Cardiovascular Disease

## 2014-01-19 ENCOUNTER — Telehealth: Payer: Self-pay

## 2014-01-19 ENCOUNTER — Other Ambulatory Visit: Payer: Self-pay

## 2014-01-19 ENCOUNTER — Encounter: Payer: Self-pay | Admitting: Cardiovascular Disease

## 2014-01-19 VITALS — BP 122/62 | HR 79 | Ht 70.0 in | Wt 254.8 lb

## 2014-01-19 DIAGNOSIS — R5382 Chronic fatigue, unspecified: Secondary | ICD-10-CM

## 2014-01-19 DIAGNOSIS — I480 Paroxysmal atrial fibrillation: Secondary | ICD-10-CM

## 2014-01-19 DIAGNOSIS — R0600 Dyspnea, unspecified: Secondary | ICD-10-CM

## 2014-01-19 DIAGNOSIS — I2510 Atherosclerotic heart disease of native coronary artery without angina pectoris: Secondary | ICD-10-CM

## 2014-01-19 LAB — CBC WITH DIFFERENTIAL/PLATELET
BASOS ABS: 0 10*3/uL (ref 0.0–0.1)
Basophils Relative: 0.5 % (ref 0.0–3.0)
EOS ABS: 0.2 10*3/uL (ref 0.0–0.7)
Eosinophils Relative: 2.6 % (ref 0.0–5.0)
HEMATOCRIT: 42.2 % (ref 39.0–52.0)
Hemoglobin: 14 g/dL (ref 13.0–17.0)
Lymphocytes Relative: 28.9 % (ref 12.0–46.0)
Lymphs Abs: 2.1 10*3/uL (ref 0.7–4.0)
MCHC: 33.1 g/dL (ref 30.0–36.0)
MCV: 90.3 fl (ref 78.0–100.0)
MONO ABS: 0.5 10*3/uL (ref 0.1–1.0)
Monocytes Relative: 6.6 % (ref 3.0–12.0)
NEUTROS PCT: 61.4 % (ref 43.0–77.0)
Neutro Abs: 4.5 10*3/uL (ref 1.4–7.7)
Platelets: 220 10*3/uL (ref 150.0–400.0)
RBC: 4.67 Mil/uL (ref 4.22–5.81)
RDW: 13.3 % (ref 11.5–15.5)
WBC: 7.4 10*3/uL (ref 4.0–10.5)

## 2014-01-19 LAB — TSH: TSH: 3.97 u[IU]/mL (ref 0.35–4.50)

## 2014-01-19 LAB — BASIC METABOLIC PANEL
BUN: 24 mg/dL — ABNORMAL HIGH (ref 6–23)
CHLORIDE: 97 meq/L (ref 96–112)
CO2: 28 mEq/L (ref 19–32)
Calcium: 9.8 mg/dL (ref 8.4–10.5)
Creatinine, Ser: 1.28 mg/dL (ref 0.40–1.50)
GFR: 57.62 mL/min — ABNORMAL LOW (ref >60.00)
GLUCOSE: 286 mg/dL — AB (ref 70–99)
Potassium: 4.4 mEq/L (ref 3.5–5.1)
Sodium: 132 mEq/L — ABNORMAL LOW (ref 135–145)

## 2014-01-19 MED ORDER — GLUCOSE BLOOD VI STRP: ORAL_STRIP | Status: DC

## 2014-01-19 MED ORDER — PRAMIPEXOLE DIHYDROCHLORIDE 1.5 MG PO TABS: 1.5000 mg | ORAL_TABLET | Freq: Every day | ORAL | Status: DC

## 2014-01-19 MED ORDER — WARFARIN SODIUM 5 MG PO TABS: ORAL_TABLET | ORAL | Status: DC

## 2014-01-19 NOTE — Progress Notes (Signed)
=    History of Present Illness: 81 yo WM with history of HTN, hyperlipidemia, morbid obesity, DM, CAD, atrial fibrillation and obstructive lung disease here for follow up. He was admitted to [facility] 06/18/11 with chest pain. Cardiac cath on 06/18/11 with moderate LAD disease with patent RCA stents. No flow limiting lesions. Normal LVEF. D Dimer and CE were normal. He was also noted to have tachycardia which was felt to be atrial fibrillation. Dr. Caryl Comes saw him in the hospital and started Xarelto for anticoagulation and increased metoprolol for rate control. Slurred speech and seen by Neuro. Head CT without evidence of CVA. Head MRI with questionable tiny acute infarct posterior left frontal lobe, mild small vessel disease type changes, global atrophy without hydrocephalus. Carotid artery dopplers 11/11/12 with mild (0-39%) bilateral ICA stenosis. He decided not to start Xarelto due to cost. He was started on coumadin and has been followed in the coumadin clinic. He was seen 12/23/12 and had c/o chest pressure when working in the yard. Stress myoview 01/13/13 with small inferior wall defect from base to apex with mild peri-infarct ischemia. LVEF=53%. Low risk study. Cath 01/27/13 with stable disease. Admitted to [facility] 02/04/12 with chest pain. Mild elevation troponin. Started on Imdur and Plavix.   He is here today for follow up. Still with daytime fatigue and somnolence. Did not keep appt with pulmonary for sleep study. He also has dyspnea with minimal exertion, dizziness and gait instability. No exertional chest pain. No LE edema. His blood sugars have been 350. Not using insulin as recommended. Not following correct diet. No syncope. Isolated episode of sharp chest pain for five seconds last week.   Primary Care Physician: Carolann Littler  Last Lipid Profile:Lipid Panel     Component Value Date/Time   CHOL 211* 11/04/2013 0905   TRIG 445.0* 11/04/2013 0905   HDL 28.80* 11/04/2013 0905   CHOLHDL 7 11/04/2013 0905   VLDL 89.0* 11/04/2013 0905   LDLCALC 81 06/20/2011 0552     Past Medical History  Diagnosis Date  . Coronary artery disease     a. h/o Overlapping stents RCA;  b. 06/2011 Cath: patent stents, nonobs dzs, NL EF.  Marland Kitchen Hypertension   . Osteoarthritis     shoulder  . Restless leg   . Hyperlipidemia   . COPD (chronic obstructive pulmonary disease)   . Nephrolithiasis   . Dyspnea   . GERD (gastroesophageal reflux disease)   . Low testosterone   . SVT (supraventricular tachycardia)   . Atrial fibrillation   . DM (diabetes mellitus)     Type 2, peripheral neuropathy.  . Diabetic peripheral neuropathy     Past Surgical History  Procedure Laterality Date  . Cholecystectomy    . Cardiac catheterization  01/2013  . Coronary angioplasty  2004  . Back surgery  [place]  . Rotator cuff repair    . Left heart catheterization with coronary angiogram N/A 06/18/2011    Procedure: LEFT HEART CATHETERIZATION WITH CORONARY ANGIOGRAM;  Surgeon: Peter M Martinique, MD;  Location: [facility] CATH LAB;  Service: Cardiovascular;  Laterality: N/A;  . Left heart catheterization with coronary angiogram N/A 01/27/2013    Procedure: LEFT HEART CATHETERIZATION WITH CORONARY ANGIOGRAM;  Surgeon: Burnell Blanks, MD;  Location: [facility] CATH LAB;  Service: Cardiovascular;  Laterality: N/A;    Current Outpatient Prescriptions  Medication Sig Dispense Refill  . ACCU-CHEK AVIVA PLUS test strip TEST 3 TIMES A DAY 100 each 3  . ACCU-CHEK AVIVA  PLUS test strip TEST 3 TIMES A DAY 100 each 3  . atorvastatin (LIPITOR) 40 MG tablet TAKE 1 TABLET BY MOUTH DAILY 90 tablet 3  . Calcium Carbonate-Vitamin D (CALCIUM + D PO) Take 1 tablet by mouth daily.     . canagliflozin (INVOKANA) 100 MG TABS tablet Take 1 tablet (100 mg total) by mouth daily. 30 tablet 11  . clonazePAM (KLONOPIN) 1 MG tablet Take 1 tablet (1 mg total) by mouth at bedtime. 30 tablet 5  . clopidogrel (PLAVIX) 75 MG tablet TAKE 1 TABLET  DAILY WITH BREAKFAST 30 tablet 5  . clotrimazole (LOTRIMIN) 1 % cream Apply 1 application topically 2 (two) times daily.     . fenofibrate micronized (LOFIBRA) 134 MG capsule Take 1 capsule by mouth   daily before breakfast 90 capsule 3  . gabapentin (NEURONTIN) 300 MG capsule Take 2 capsules by mouth 3  times a day 540 capsule 3  . insulin aspart (NOVOLOG) 100 UNIT/ML FlexPen 18 to 20 units with each meal    . Insulin Glargine (TOUJEO SOLOSTAR) 300 UNIT/ML SOPN Inject 50 Units into the skin 2 (two) times daily.    . metoprolol succinate (TOPROL-XL) 100 MG 24 hr tablet Take 1 tablet by mouth  every day immediately  following a meal 90 tablet 3  . MYRBETRIQ 50 MG TB24 tablet Take 50 mg by mouth daily. By mouth daily    . nitroGLYCERIN (NITROSTAT) 0.4 MG SL tablet Place 0.4 mg under the tongue every 5 (five) minutes as needed. Chest pain    . pantoprazole (PROTONIX) 40 MG tablet Take 40 mg by mouth daily.    . pramipexole (MIRAPEX) 1.5 MG tablet Take 1 tablet (1.5 mg total) by mouth at bedtime. 30 tablet 5  . tamsulosin (FLOMAX) 0.4 MG CAPS capsule Take 0.4 mg by mouth. 0.4 mg daily by mouth    . warfarin (COUMADIN) 5 MG tablet TAKE AS DIRECTED BY ANTICOAGULATION CLINIC 35 tablet 3  . [DISCONTINUED] metFORMIN (GLUCOPHAGE) 500 MG tablet Take 1 tablet (500 mg total) by mouth 2 (two) times daily with a meal. 60 tablet 1  . [DISCONTINUED] metoprolol (LOPRESSOR) 50 MG tablet     . [DISCONTINUED] spironolactone (ALDACTONE) 25 MG tablet      No current facility-administered medications for this visit.   Facility-Administered Medications Ordered in Other Visits  Medication Dose Route Frequency Provider Last Rate Last Dose  . testosterone cypionate (DEPOTESTOTERONE CYPIONATE) injection 200 mg  200 mg Intramuscular Q28 days [name], MD   200 mg at 12/26/11 0850  . testosterone cypionate (DEPOTESTOTERONE CYPIONATE) injection 200 mg  200 mg Intramuscular Q28 days [name], MD   200 mg at  01/23/12 0908  . testosterone cypionate (DEPOTESTOTERONE CYPIONATE) injection 200 mg  200 mg Intramuscular Q28 days [name], MD   200 mg at 02/25/12 0848  . testosterone cypionate (DEPOTESTOTERONE CYPIONATE) injection 200 mg  200 mg Intramuscular Q28 days [name], MD   200 mg at 06/03/12 1437    Allergies  Allergen Reactions  . Ace Inhibitors Other (See Comments)    cough  . Codeine Nausea Only and Rash       . Penicillins Rash    History   Social History  . Marital Status: Married    Spouse Name: N/A    Number of Children: 3  . Years of Education: N/A   Occupational History  . Retired    Social History Main Topics  .  Smoking status: Former Smoker -- 1.50 packs/day for 20 years    Types: Cigarettes    Quit date: 04/05/1983  . Smokeless tobacco: Never Used  . Alcohol Use: No  . Drug Use: No  . Sexual Activity: Not Currently   Other Topics Concern  . Not on file   Social History Narrative    Family History  Problem Relation Age of Onset  . Coronary artery disease      Male 1st degree relative <50  . Coronary artery disease      male 1st degree relative <60  . Heart disease Father   . Migraines Father   . Ulcers Father   . Alzheimer's disease Mother   . Heart disease Mother   . Heart disease Sister   . Obesity Sister     Morbid  . Arthritis Sister   . Heart disease Brother   . Arthritis Brother   . Sleep apnea Son   . Obesity Son   . Migraines Daughter   . Thyroid disease Daughter     Review of Systems:  As stated in the HPI and otherwise negative.   BP 122/62 mmHg  Pulse 79  Ht 5\' 10"  (1.778 m)  Wt 254 lb 12.8 oz (115.577 kg)  BMI 36.56 kg/m2  SpO2 94%  Physical Examination: General: Well developed, well nourished, NAD HEENT: OP clear, mucus membranes moist SKIN: warm, dry. No rashes. Neuro: No focal deficits Musculoskeletal: Muscle strength 5/5 all ext Psychiatric: Mood and affect normal Neck: No JVD, no carotid  bruits, no thyromegaly, no lymphadenopathy. Lungs:Clear bilaterally, no wheezes, rhonci, crackles Cardiovascular: Regular rate and rhythm. No murmurs, gallops or rubs. Abdomen:Soft. Bowel sounds present. Non-tender.  Extremities: No lower extremity edema. Pulses are 2 + in the bilateral DP/PT.  Stress myoview 01/13/13: Stress Procedure: The patient received IV Lexiscan 0.4 mg over 15-seconds. Technetium 54m Sestamibi injected at 30-seconds. Quantitative spect images were obtained after a 45 minute delay. During the infusion of Lexiscan, the patient complained of a "strange" feeling and a headache. These symptoms resolved in recovery.  Stress ECG: No significant change from baseline ECG  QPS  Raw Data Images: Normal; no motion artifact; normal heart/lung ratio.  Stress Images: There is decreased uptake in the inferior wall.  Rest Images: There is decreased uptake in the inferior wall.  Subtraction (SDS): mixed infarct and ischemia  Transient Ischemic Dilatation (Normal <1.22): 0.98  Lung/Heart Ratio (Normal <0.45): 0.40  Quantitative Gated Spect Images  QGS EDV: 126 ml  QGS ESV: 54 ml  Impression  Exercise Capacity: Lexiscan with no exercise.  BP Response: Normal blood pressure response.  Clinical Symptoms: Headache  ECG Impression: No significant ST segment change suggestive of ischemia.  Comparison with Prior Nuclear Study: No images to compare  Overall Impression: Low risk stress nuclear study Small inferior wall infarct from apex to base with mild peri infarct ischemia.  LV Ejection Fraction: 57%. LV Wall Motion: NL LV Function; NL Wall Motion  Cardiac cath 01/27/13: Left main: No obstructive disease.  Left Anterior Descending Artery: Large caliber vessel in the proximal segment with trifurcation in the mid segment into a large septal perforating branch, moderate caliber bifurcating diagonal branch and small to moderate caliber continuation of the LAD. There appears to be a 40-50%  stenosis at the trifurcation in the mid LAD. The diagonal branch bifurcates. The superior branch of the diagonal after the bifurcation is small in caliber with 70% stenosis.  Circumflex Artery: Large caliber vessel with  large caliber obtuse marginal branch. The proximal and mid vessel has diffuse 30% stenosis, unchanged. The obtuse marginal branch has diffuse 30% stenosis in the proximal segment of the vessel.  Right Coronary Artery: Large caliber dominant vessel with patent proximal and mid stented segment with 20% stent restenosis. Mild plaque in the large caliber PDA.   EKG: NSR, rate 79 bpm. RBBB  Assessment and Plan:   1. CORONARY ARTERY DISEASE: Stable. I do not think his dyspnea is related to his mild to moderate CAD. He is known to have moderate LAD stenosis and patent stents RCA by cath January 2015. Stress myoview with inferior infarct involving entire inferior wall with peri-infarct ischemia. Cath January 2015 with stable disease. Continue Plavix, beta blocker, statin.  No ASA since he is also on coumadin.   2. Paroxysmal atrial fibrillation: He is maintaining NSR. He did not want to start Xarelto due to cost. He has been maintained on coumadin therapy. Will continue Toprol at current dose.   3. Sleep apnea: He snores loudly at night, has excessive daytime fatigue with somnolence but trouble sleeping at night. Referral to Pulmonary but pt did not keep this appt.   4. Fatigue: I do not think this is cardiac related. Will check BMET, CBC and TSH. I think it is likely related to his poorly controlled diabetes. His blood sugars at home have been 350. HgbA1C recently 13.5.   5. Dyspnea: Will arrange echo to reassess LVEF. No loud murmurs on exam. Could be related to his weight and deconditioning.   6. Diabetes mellitus: He is asked to see primary care to discuss.

## 2014-01-19 NOTE — Telephone Encounter (Signed)
Done for 1 month. Pt needs coumadin appointment

## 2014-01-19 NOTE — Telephone Encounter (Signed)
Refill  Warfarin #90 day supply  [facility] [address] [facility]

## 2014-01-19 NOTE — Patient Instructions (Addendum)
Your physician recommends that you schedule a follow-up appointment in:  6-8 weeks.   Your physician has requested that you have an echocardiogram. Echocardiography is a painless test that uses sound waves to create images of your heart. It provides your doctor with information about the size and shape of your heart and how well your heart's chambers and valves are working. This procedure takes approximately one hour. There are no restrictions for this procedure.   Lab work to be done today--CBC,BMP,TSH

## 2014-01-20 ENCOUNTER — Telehealth: Payer: Self-pay

## 2014-01-20 ENCOUNTER — Other Ambulatory Visit: Payer: Self-pay

## 2014-01-20 ENCOUNTER — Other Ambulatory Visit: Payer: Self-pay | Admitting: Family Medicine

## 2014-01-20 DIAGNOSIS — E1140 Type 2 diabetes mellitus with diabetic neuropathy, unspecified: Secondary | ICD-10-CM

## 2014-01-20 DIAGNOSIS — E1165 Type 2 diabetes mellitus with hyperglycemia: Principal | ICD-10-CM

## 2014-01-20 MED ORDER — NITROGLYCERIN 0.4 MG SL SUBL: 0.4000 mg | SUBLINGUAL_TABLET | SUBLINGUAL | Status: DC | PRN

## 2014-01-20 NOTE — Telephone Encounter (Signed)
Appts has been scheduled.

## 2014-01-20 NOTE — Telephone Encounter (Signed)
Can you please set up a 30 min visit for patient. He also needs a lab appt a couple of days before he comes in for a 30 min appt. Thank you. Can you please tell him to keep a log of his sugars. Thank you

## 2014-01-26 ENCOUNTER — Telehealth: Payer: Self-pay | Admitting: Family

## 2014-01-26 ENCOUNTER — Ambulatory Visit (HOSPITAL_COMMUNITY): Payer: Medicare Other | Attending: Family Medicine | Admitting: Radiology

## 2014-01-26 ENCOUNTER — Ambulatory Visit (INDEPENDENT_AMBULATORY_CARE_PROVIDER_SITE_OTHER): Payer: Medicare Other | Admitting: Family

## 2014-01-26 ENCOUNTER — Other Ambulatory Visit (INDEPENDENT_AMBULATORY_CARE_PROVIDER_SITE_OTHER): Payer: Medicare Other

## 2014-01-26 DIAGNOSIS — I2510 Atherosclerotic heart disease of native coronary artery without angina pectoris: Secondary | ICD-10-CM | POA: Diagnosis present

## 2014-01-26 DIAGNOSIS — Z5181 Encounter for therapeutic drug level monitoring: Secondary | ICD-10-CM

## 2014-01-26 DIAGNOSIS — E1140 Type 2 diabetes mellitus with diabetic neuropathy, unspecified: Secondary | ICD-10-CM

## 2014-01-26 DIAGNOSIS — R0600 Dyspnea, unspecified: Secondary | ICD-10-CM

## 2014-01-26 DIAGNOSIS — I480 Paroxysmal atrial fibrillation: Secondary | ICD-10-CM

## 2014-01-26 DIAGNOSIS — E1165 Type 2 diabetes mellitus with hyperglycemia: Secondary | ICD-10-CM

## 2014-01-26 LAB — HEMOGLOBIN A1C: Hgb A1c MFr Bld: 16.3 % — ABNORMAL HIGH (ref 4.6–6.5)

## 2014-01-26 LAB — POCT INR: INR: 1.4

## 2014-01-26 NOTE — Telephone Encounter (Signed)
Needs PT/INR appt asap.

## 2014-01-26 NOTE — Telephone Encounter (Signed)
done

## 2014-01-26 NOTE — Patient Instructions (Signed)
Anticoagulation Dose Instructions as of 01/26/2014      Gary Yates Tue Wed Thu Fri Sat   New Dose 5 mg 5 mg 5 mg 7.5 mg 5 mg 5 mg 5 mg    Description        Take an extra 1/2 tablet today and tomorrow. Then continue 5 mg daily except 7.5 mg on Wednesdays. Recheck in 3 weeks

## 2014-01-26 NOTE — Progress Notes (Signed)
Echocardiogram performed.  

## 2014-01-27 ENCOUNTER — Other Ambulatory Visit: Payer: Medicare Other

## 2014-02-03 ENCOUNTER — Ambulatory Visit: Payer: Medicare Other | Admitting: Family Medicine

## 2014-02-03 ENCOUNTER — Ambulatory Visit: Payer: Medicare Other | Admitting: Family

## 2014-02-04 ENCOUNTER — Ambulatory Visit: Payer: Medicare Other | Admitting: Family Medicine

## 2014-02-06 ENCOUNTER — Other Ambulatory Visit: Payer: Self-pay | Admitting: Family Medicine

## 2014-02-07 ENCOUNTER — Telehealth: Payer: Self-pay | Admitting: Family Medicine

## 2014-02-07 ENCOUNTER — Ambulatory Visit (INDEPENDENT_AMBULATORY_CARE_PROVIDER_SITE_OTHER): Payer: Medicare Other | Admitting: General Practice

## 2014-02-07 ENCOUNTER — Encounter: Payer: Self-pay | Admitting: Family Medicine

## 2014-02-07 ENCOUNTER — Ambulatory Visit (INDEPENDENT_AMBULATORY_CARE_PROVIDER_SITE_OTHER): Payer: Medicare Other | Admitting: Family Medicine

## 2014-02-07 VITALS — BP 130/72 | HR 83 | Temp 98.0°F | Wt 257.0 lb

## 2014-02-07 DIAGNOSIS — I480 Paroxysmal atrial fibrillation: Secondary | ICD-10-CM

## 2014-02-07 DIAGNOSIS — Z7901 Long term (current) use of anticoagulants: Secondary | ICD-10-CM

## 2014-02-07 DIAGNOSIS — Z5181 Encounter for therapeutic drug level monitoring: Secondary | ICD-10-CM

## 2014-02-07 DIAGNOSIS — E1165 Type 2 diabetes mellitus with hyperglycemia: Principal | ICD-10-CM

## 2014-02-07 DIAGNOSIS — G2581 Restless legs syndrome: Secondary | ICD-10-CM

## 2014-02-07 DIAGNOSIS — E1140 Type 2 diabetes mellitus with diabetic neuropathy, unspecified: Secondary | ICD-10-CM

## 2014-02-07 LAB — POCT INR: INR: 1.9

## 2014-02-07 MED ORDER — INSULIN ASPART 100 UNIT/ML FLEXPEN: PEN_INJECTOR | SUBCUTANEOUS | Status: DC

## 2014-02-07 MED ORDER — PRAMIPEXOLE DIHYDROCHLORIDE 1.5 MG PO TABS: ORAL_TABLET | ORAL | Status: DC

## 2014-02-07 NOTE — Telephone Encounter (Signed)
PA for Novolog was denied.  Patient must try and fail Humalog Kwikpen.

## 2014-02-07 NOTE — Patient Instructions (Signed)
Increase the Toujeo (long acting insulin) to 60 units twice daily and take dose about 12 hours apart and DO NOT SKIP DOSES Continue with the Novolog 20 units with meals and take about 10- 15 minutes prior to meals Check some 2 hour postprandial (after meals) sugars and record and share with [place] in 2 weeks

## 2014-02-07 NOTE — Progress Notes (Signed)
Subjective:    Patient ID: Gary Ravelo., male    DOB: Oct 17, 1933, 81 y.o.   MRN: [id number]  HPI Patient has multiple medical problems and is here today to discuss follow-up regarding poorly controlled diabetes. Recent A1c 16%. Patient has history of poor compliance both with diet as well as medications. His current diabetes regimen is supposed to include Toujeo 50 units twice daily, NovoLog 20 units 3 times daily with meals, and Invokana 100 mg daily.  He states that he frequently skips his second dose of long-acting insulin and frequently does not take his NovoLog either. He's had some recent mild weight loss and has polyuria and polydipsia. No recent fever. No recent prednisone use. Very poor dietary compliance.  We have struggled for some time getting him to take his medications regularly. He did bring in some blood sugars today and most of his fastings have been over 300. He is not checking consistent postprandial blood sugars.  Restless leg syndrome. He takes Mirapex 1.5 mg daily at bedtime and recently has been taking 1-1/2 tablets and requiring this for control. He previously took gabapentin but did not feel this helped much.  Past Medical History  Diagnosis Date  . Coronary artery disease     a. h/o Overlapping stents RCA;  b. 06/2011 Cath: patent stents, nonobs dzs, NL EF.  Marland Kitchen Hypertension   . Osteoarthritis     shoulder  . Restless leg   . Hyperlipidemia   . COPD (chronic obstructive pulmonary disease)   . Nephrolithiasis   . Dyspnea   . GERD (gastroesophageal reflux disease)   . Low testosterone   . SVT (supraventricular tachycardia)   . Atrial fibrillation   . DM (diabetes mellitus)     Type 2, peripheral neuropathy.  . Diabetic peripheral neuropathy    Past Surgical History  Procedure Laterality Date  . Cholecystectomy    . Cardiac catheterization  01/2013  . Coronary angioplasty  2004  . Back surgery  [place]  . Rotator cuff repair    . Left heart  catheterization with coronary angiogram N/A 06/18/2011    Procedure: LEFT HEART CATHETERIZATION WITH CORONARY ANGIOGRAM;  Surgeon: Peter M Martinique, MD;  Location: [facility] CATH LAB;  Service: Cardiovascular;  Laterality: N/A;  . Left heart catheterization with coronary angiogram N/A 01/27/2013    Procedure: LEFT HEART CATHETERIZATION WITH CORONARY ANGIOGRAM;  Surgeon: Burnell Blanks, MD;  Location: [facility] CATH LAB;  Service: Cardiovascular;  Laterality: N/A;    reports that he quit smoking about 30 years ago. His smoking use included Cigarettes. He has a 30 pack-year smoking history. He has never used smokeless tobacco. He reports that he does not drink alcohol or use illicit drugs. family history includes Alzheimer's disease in his mother; Arthritis in his brother and sister; Coronary artery disease in some other family members; Heart disease in his brother, father, mother, and sister; Migraines in his daughter and father; Obesity in his sister and son; Sleep apnea in his son; Thyroid disease in his daughter; Ulcers in his father. Allergies  Allergen Reactions  . Ace Inhibitors Other (See Comments)    cough  . Codeine Nausea Only and Rash       . Penicillins Rash      Review of Systems  Constitutional: Positive for fatigue.  Eyes: Negative for visual disturbance.  Respiratory: Negative for cough, chest tightness and shortness of breath.   Cardiovascular: Negative for chest pain, palpitations and leg swelling.  Gastrointestinal: Negative  for nausea, vomiting and abdominal pain.  Endocrine: Negative for polydipsia and polyuria.  Neurological: Negative for dizziness, syncope, weakness, light-headedness and headaches.       Objective:   Physical Exam  Constitutional: He is oriented to person, place, and time. He appears well-developed and well-nourished.  Neck: No JVD present.  Cardiovascular: Normal rate and regular rhythm.   Pulmonary/Chest: Effort normal and breath sounds normal. No  respiratory distress. He has no wheezes. He has no rales.  Musculoskeletal: He exhibits no edema.  Neurological: He is alert and oriented to person, place, and time.  Psychiatric: He has a normal mood and affect.          Assessment & Plan:  Type 2 diabetes. History of very poor control. Very poor compliance. We have strongly suggested that he not skip any of his insulin doses. Increase Toujeo to 60 units twice daily. Continue NovoLog 20 units 3 times a day with meals. Record several blood sugars both fasting and postprandial over the next couple weeks and get back some readings to [place]. Will titrate NovoLog based on that.  Restless leg syndrome. Requiring increased doses of Mirapex for control.  Refills given.

## 2014-02-07 NOTE — Progress Notes (Signed)
Pre visit review using our clinic review tool, if applicable. No additional management support is needed unless otherwise documented below in the visit note. 

## 2014-02-07 NOTE — Telephone Encounter (Signed)
OK to use Humalog- he would take same units as the Novolog-20 units with each meal.

## 2014-02-08 ENCOUNTER — Telehealth: Payer: Self-pay | Admitting: Family Medicine

## 2014-02-08 MED ORDER — INSULIN GLARGINE 300 UNIT/ML ~~LOC~~ SOPN: 50.0000 [IU] | PEN_INJECTOR | Freq: Two times a day (BID) | SUBCUTANEOUS | Status: DC

## 2014-02-08 NOTE — Telephone Encounter (Signed)
Insulin changed. Pt is aware.

## 2014-02-08 NOTE — Telephone Encounter (Signed)
Rx sent to pharmacy   

## 2014-02-08 NOTE — Progress Notes (Signed)
Pre visit review using our clinic review tool, if applicable. No additional management support is needed unless otherwise documented below in the visit note. 

## 2014-02-08 NOTE — Telephone Encounter (Signed)
Pt following up on request. pls would like a cb

## 2014-02-08 NOTE — Telephone Encounter (Signed)
Pt forgot to ask for refills on hisToujeo yesterday. He is on his last pen and [facility] [address]

## 2014-02-14 ENCOUNTER — Telehealth: Payer: Self-pay | Admitting: Family Medicine

## 2014-02-14 MED ORDER — INSULIN LISPRO 100 UNIT/ML (KWIKPEN): PEN_INJECTOR | SUBCUTANEOUS | Status: DC

## 2014-02-14 NOTE — Telephone Encounter (Signed)
[facility] [address] [facility] since Novolog was denied.

## 2014-02-14 NOTE — Telephone Encounter (Signed)
RX sent to pharmacy  

## 2014-02-14 NOTE — Telephone Encounter (Signed)
Pt request refill of the following: insulin lispro (HUMALOG) 100 UNIT/ML KiwkPen     [facility] [address]

## 2014-02-14 NOTE — Telephone Encounter (Signed)
Changed to humalog at last visit.

## 2014-02-17 ENCOUNTER — Ambulatory Visit: Payer: Medicare Other

## 2014-03-08 ENCOUNTER — Encounter (HOSPITAL_COMMUNITY): Payer: Self-pay | Admitting: Emergency Medicine

## 2014-03-08 ENCOUNTER — Emergency Department (HOSPITAL_COMMUNITY): Payer: Medicare Other

## 2014-03-08 ENCOUNTER — Emergency Department (HOSPITAL_COMMUNITY)
Admission: EM | Admit: 2014-03-08 | Discharge: 2014-03-09 | Disposition: A | Discharge status: Home / Self Care | Payer: Medicare Other | Attending: Emergency Medicine | Admitting: Emergency Medicine

## 2014-03-08 DIAGNOSIS — I10 Essential (primary) hypertension: Secondary | ICD-10-CM | POA: Diagnosis not present

## 2014-03-08 DIAGNOSIS — Z794 Long term (current) use of insulin: Secondary | ICD-10-CM | POA: Insufficient documentation

## 2014-03-08 DIAGNOSIS — E1342 Other specified diabetes mellitus with diabetic polyneuropathy: Secondary | ICD-10-CM | POA: Insufficient documentation

## 2014-03-08 DIAGNOSIS — Z87442 Personal history of urinary calculi: Secondary | ICD-10-CM | POA: Diagnosis not present

## 2014-03-08 DIAGNOSIS — G2581 Restless legs syndrome: Secondary | ICD-10-CM | POA: Insufficient documentation

## 2014-03-08 DIAGNOSIS — R2689 Other abnormalities of gait and mobility: Secondary | ICD-10-CM | POA: Insufficient documentation

## 2014-03-08 DIAGNOSIS — Z7901 Long term (current) use of anticoagulants: Secondary | ICD-10-CM | POA: Insufficient documentation

## 2014-03-08 DIAGNOSIS — I2510 Atherosclerotic heart disease of native coronary artery without angina pectoris: Secondary | ICD-10-CM | POA: Diagnosis not present

## 2014-03-08 DIAGNOSIS — Z9861 Coronary angioplasty status: Secondary | ICD-10-CM | POA: Diagnosis not present

## 2014-03-08 DIAGNOSIS — Z79899 Other long term (current) drug therapy: Secondary | ICD-10-CM | POA: Insufficient documentation

## 2014-03-08 DIAGNOSIS — G629 Polyneuropathy, unspecified: Secondary | ICD-10-CM | POA: Diagnosis not present

## 2014-03-08 DIAGNOSIS — Z87891 Personal history of nicotine dependence: Secondary | ICD-10-CM | POA: Insufficient documentation

## 2014-03-08 DIAGNOSIS — R079 Chest pain, unspecified: Secondary | ICD-10-CM | POA: Diagnosis present

## 2014-03-08 DIAGNOSIS — R2681 Unsteadiness on feet: Secondary | ICD-10-CM

## 2014-03-08 DIAGNOSIS — Z9889 Other specified postprocedural states: Secondary | ICD-10-CM | POA: Insufficient documentation

## 2014-03-08 DIAGNOSIS — K219 Gastro-esophageal reflux disease without esophagitis: Secondary | ICD-10-CM | POA: Insufficient documentation

## 2014-03-08 DIAGNOSIS — M79672 Pain in left foot: Secondary | ICD-10-CM | POA: Diagnosis not present

## 2014-03-08 DIAGNOSIS — J441 Chronic obstructive pulmonary disease with (acute) exacerbation: Secondary | ICD-10-CM | POA: Diagnosis not present

## 2014-03-08 DIAGNOSIS — Z880 Allergy status to penicillin: Secondary | ICD-10-CM | POA: Diagnosis not present

## 2014-03-08 DIAGNOSIS — E785 Hyperlipidemia, unspecified: Secondary | ICD-10-CM | POA: Insufficient documentation

## 2014-03-08 LAB — CBC
HCT: 39.7 % (ref 39.0–52.0)
HEMOGLOBIN: 13.1 g/dL (ref 13.0–17.0)
MCH: 30.2 pg (ref 26.0–34.0)
MCHC: 33 g/dL (ref 30.0–36.0)
MCV: 91.5 fL (ref 78.0–100.0)
Platelets: 232 10*3/uL (ref 150–400)
RBC: 4.34 MIL/uL (ref 4.22–5.81)
RDW: 13.4 % (ref 11.5–15.5)
WBC: 6 10*3/uL (ref 4.0–10.5)

## 2014-03-08 LAB — BASIC METABOLIC PANEL
Anion gap: 9 (ref 5–15)
BUN: 18 mg/dL (ref 6–23)
CALCIUM: 9.5 mg/dL (ref 8.4–10.5)
CHLORIDE: 100 mmol/L (ref 96–112)
CO2: 29 mmol/L (ref 19–32)
CREATININE: 1.41 mg/dL — AB (ref 0.50–1.35)
GFR calc Af Amer: 53 mL/min — ABNORMAL LOW (ref >90)
GFR calc non Af Amer: 46 mL/min — ABNORMAL LOW (ref >90)
GLUCOSE: 179 mg/dL — AB (ref 70–99)
Potassium: 4.2 mmol/L (ref 3.5–5.1)
Sodium: 138 mmol/L (ref 135–145)

## 2014-03-08 LAB — BRAIN NATRIURETIC PEPTIDE: B NATRIURETIC PEPTIDE 5: 18.8 pg/mL (ref 0.0–100.0)

## 2014-03-08 LAB — PROTIME-INR
INR: 1.71 — ABNORMAL HIGH (ref 0.00–1.49)
Prothrombin Time: 20.3 seconds — ABNORMAL HIGH (ref 11.6–15.2)

## 2014-03-08 LAB — I-STAT TROPONIN, ED: Troponin i, poc: 0 ng/mL (ref 0.00–0.08)

## 2014-03-08 NOTE — ED Notes (Signed)
Patient with sharp chest pains for the last few days, increasing tonight.  Patient states that it is in his left chest, radiates to his left shoulder and down left side.  Patient has shortness of breath, lightheaded.  Patient states that he has shortness of breath usually, but it is more pronounced at this time.  Patient does have some nausea, no vomiting.

## 2014-03-09 ENCOUNTER — Encounter: Payer: Self-pay | Admitting: Cardiovascular Disease

## 2014-03-09 ENCOUNTER — Ambulatory Visit (INDEPENDENT_AMBULATORY_CARE_PROVIDER_SITE_OTHER): Payer: Medicare Other | Admitting: Cardiovascular Disease

## 2014-03-09 ENCOUNTER — Ambulatory Visit: Payer: Medicare Other | Admitting: Family Medicine

## 2014-03-09 VITALS — BP 128/72 | HR 76 | Ht 70.0 in | Wt 273.0 lb

## 2014-03-09 DIAGNOSIS — I2510 Atherosclerotic heart disease of native coronary artery without angina pectoris: Secondary | ICD-10-CM

## 2014-03-09 DIAGNOSIS — I480 Paroxysmal atrial fibrillation: Secondary | ICD-10-CM

## 2014-03-09 DIAGNOSIS — G4730 Sleep apnea, unspecified: Secondary | ICD-10-CM

## 2014-03-09 NOTE — Patient Instructions (Signed)
Your physician wants you to follow-up in:  6 months. You will receive a reminder letter in the mail two months in advance. If you don't receive a letter, please call our office to schedule the follow-up appointment.   

## 2014-03-09 NOTE — ED Provider Notes (Signed)
CSN: [id number]     Arrival date & time 03/08/14  1905 History   First MD Initiated Contact with Patient 03/08/14 1939     Chief Complaint  Patient presents with  . Chest Pain  . 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  He states he had sudden pain in his left foot.  He asked his wife are ibuprofen and now he is without any foot pain.  He does admit to some occasional sharp chest pain that last for a few seconds and then resolves.  He denies chest pain at this time.  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  He denies productive cough.  The sharp pains somewhat radiate towards his left shoulder as well.  He states this usually happens when he is sitting in his recliner at home.  It does not seem to come on when he walks.  He denies orthopnea.  Denies unilateral leg swelling.  No history of congestive heart failure.  He denies active pain at this time.  He also reports that he feels like his been more off balance lately.  His primary care physicians believe this is secondary to his neuropathy.  He walks with a cane.  He states he is frustrated because of his balance issues which limit his ability to do things that he enjoys doing.  He states this is been occurring for several months.  He feels as though it's worsened in the past several weeks ago.   Past Medical History  Diagnosis Date  . Coronary artery disease     a. h/o Overlapping stents RCA;  b. 06/2011 Cath: patent stents, nonobs dzs, NL EF.  Marland Kitchen Hypertension   . Osteoarthritis     shoulder  . Restless leg   . Hyperlipidemia   . COPD (chronic obstructive pulmonary disease)   . Nephrolithiasis   . Dyspnea   . GERD (gastroesophageal reflux disease)   . Low testosterone   . SVT (supraventricular tachycardia)   . Atrial fibrillation   . DM (diabetes mellitus)     Type 2, peripheral  neuropathy.  . Diabetic peripheral neuropathy    Past Surgical History  Procedure Laterality Date  . Cholecystectomy    . Cardiac catheterization  01/2013  . Coronary angioplasty  2004  . Back surgery  [place]  . Rotator cuff repair    . Left heart catheterization with coronary angiogram N/A 06/18/2011    Procedure: LEFT HEART CATHETERIZATION WITH CORONARY ANGIOGRAM;  Surgeon: Peter M Martinique, MD;  Location: [facility] CATH LAB;  Service: Cardiovascular;  Laterality: N/A;  . Left heart catheterization with coronary angiogram N/A 01/27/2013    Procedure: LEFT HEART CATHETERIZATION WITH CORONARY ANGIOGRAM;  Surgeon: Burnell Blanks, MD;  Location: [facility] CATH LAB;  Service: Cardiovascular;  Laterality: N/A;   Family History  Problem Relation Age of Onset  . Coronary artery disease      Male 1st degree relative <50  . Coronary artery disease      male 1st degree relative <60  . Heart disease Father   . Migraines Father   . Ulcers Father   . Alzheimer's disease Mother   . Heart disease Mother   . Heart disease Sister   . Obesity Sister     Morbid  . Arthritis Sister   . Heart  disease Brother   . Arthritis Brother   . Sleep apnea Son   . Obesity Son   . Migraines Daughter   . Thyroid disease Daughter    History  Substance Use Topics  . Smoking status: Former Smoker -- 1.50 packs/day for 20 years    Types: Cigarettes    Quit date: 04/05/1983  . Smokeless tobacco: Never Used  . Alcohol Use: No    Review of Systems  All other systems reviewed and are negative.     Allergies  Ace inhibitors; Codeine; and Penicillins  Home Medications   Prior to Admission medications   Medication Sig Start Date End Date Taking? Authorizing Provider  atorvastatin (LIPITOR) 40 MG tablet TAKE 1 TABLET BY MOUTH DAILY 11/29/13  Yes [name], MD  Calcium Carbonate-Vitamin D (CALCIUM + D PO) Take 1 tablet by mouth daily.    Yes Historical Provider, MD  canagliflozin (INVOKANA) 100 MG  TABS tablet Take 1 tablet (100 mg total) by mouth daily. 11/04/13  Yes [name], MD  clonazePAM (KLONOPIN) 1 MG tablet Take 1 tablet (1 mg total) by mouth at bedtime. 11/04/13  Yes [name], MD  clopidogrel (PLAVIX) 75 MG tablet TAKE 1 TABLET DAILY WITH BREAKFAST 02/07/14  Yes [name], MD  clotrimazole (LOTRIMIN) 1 % cream Apply 1 application topically daily as needed. For rash 11/25/13  Yes Historical Provider, MD  fenofibrate micronized (LOFIBRA) 134 MG capsule Take 1 capsule by mouth   daily before breakfast 05/28/13  Yes [name], MD  gabapentin (NEURONTIN) 300 MG capsule Take 2 capsules by mouth 3  times a day 05/28/13  Yes [name], MD  Insulin Glargine (TOUJEO SOLOSTAR) 300 UNIT/ML SOPN Inject 50 Units into the skin 2 (two) times daily. Patient taking differently: Inject 60 Units into the skin 2 (two) times daily.  02/08/14  Yes [name], MD  insulin lispro (HUMALOG) 100 UNIT/ML KiwkPen Inject 18-20 units with each meal. 02/14/14  Yes [name], MD  metoprolol succinate (TOPROL-XL) 100 MG 24 hr tablet Take 1 tablet by mouth  every day immediately  following a meal 05/28/13  Yes [name], MD  MYRBETRIQ 50 MG TB24 tablet Take 50 mg by mouth daily. By mouth daily 01/18/14  Yes Historical Provider, MD  nitroGLYCERIN (NITROSTAT) 0.4 MG SL tablet Place 1 tablet (0.4 mg total) under the tongue every 5 (five) minutes as needed. Chest pain 01/20/14  Yes Burnell Blanks, MD  oxymetazoline (AFRIN) 0.05 % nasal spray Place 1 spray into both nostrils 2 (two) times daily.   Yes Historical Provider, MD  pantoprazole (PROTONIX) 40 MG tablet Take 40 mg by mouth daily.   Yes Historical Provider, MD  pramipexole (MIRAPEX) 1.5 MG tablet May take 1 to 1 and half tablets by mouth at bedtime. Patient taking differently: Take 1.5 mg by mouth daily.  02/07/14  Yes [name], MD  tamsulosin (FLOMAX) 0.4 MG CAPS capsule Take 0.4 mg by mouth daily  as needed. For fluid 01/04/14  Yes Historical Provider, MD  warfarin (COUMADIN) 5 MG tablet Take 2.5-5 mg by mouth daily. Take 5mg  every day except on Wednesday take 7.5mg    Yes Historical Provider, MD  ACCU-CHEK AVIVA PLUS test strip TEST 3 TIMES A DAY    [name], MD  glucose blood (ACCU-CHEK AVIVA PLUS) test strip TEST 3 TIMES A DAY 01/19/14   [name], MD  warfarin (COUMADIN) 5 MG  tablet TAKE AS DIRECTED BY ANTICOAGULATION CLINIC Patient not taking: Reported on 03/08/2014 01/19/14   Timoteo Gaul, FNP   BP 137/70 mmHg  Pulse 75  Temp(Src) 98.1 F (36.7 C) (Oral)  Resp 16  Ht 5\' 10"  (1.778 m)  Wt 254 lb (115.214 kg)  BMI 36.45 kg/m2  SpO2 97% Physical Exam  Constitutional: He is oriented to person, place, and time. He appears well-developed and well-nourished.  HENT:  Head: Normocephalic and atraumatic.  Eyes: EOM are normal.  Neck: Normal range of motion.  Cardiovascular: Normal rate, regular rhythm, normal heart sounds and intact distal pulses.   Pulmonary/Chest: Effort normal and breath sounds normal. No respiratory distress.  Abdominal: Soft. He exhibits no distension. There is no tenderness.  Musculoskeletal: Normal range of motion.  Normal PT and DP pulses bilaterally.  Full range of motion of left ankle.  No tenderness or swelling to his left foot or left ankle.  Full range of motion of left knee.  No skin changes to his left lower extremity are noted.  Neurological: He is alert and oriented to person, place, and time.  Skin: Skin is warm and dry.  Psychiatric: He has a normal mood and affect. Judgment normal.  Nursing note and vitals reviewed.   ED Course  Procedures (including critical care time) Labs Review Labs Reviewed  BASIC METABOLIC PANEL - Abnormal; Notable for the following:    Glucose, Bld 179 (*)    Creatinine, Ser 1.41 (*)    GFR calc non Af Amer 46 (*)    GFR calc Af Amer 53 (*)    All other components within normal limits   PROTIME-INR - Abnormal; Notable for the following:    Prothrombin Time 20.3 (*)    INR 1.71 (*)    All other components within normal limits  CBC  BRAIN NATRIURETIC PEPTIDE  I-STAT TROPOININ, ED    Imaging Review Dg Chest 2 View  03/08/2014   CLINICAL DATA:  LEFT-sided chest pain radiating into the LEFT side of the neck intermittently for 1 month. Shortness of breath for 3 weeks.  EXAM: CHEST  2 VIEW  COMPARISON:  02/04/2013 CT.  FINDINGS: Cardiopericardial silhouette is enlarged. There is pulmonary vascular congestion. Emphysema is present with flattening of the hemidiaphragms. Basilar atelectasis. Mild thoracic spondylosis. No focal consolidation. No pleural effusion is identified.  IMPRESSION: Cardiomegaly with pulmonary vascular congestion. Underlying emphysema.   Electronically Signed   By: Dereck Ligas M.D.   On: 03/08/2014 20:57     EKG Interpretation   Date/Time:  Tuesday March 08 2014 19:08:27 EST Ventricular Rate:  78 PR Interval:  184 QRS Duration: 158 QT Interval:  440 QTC Calculation: 501 R Axis:   -90 Text Interpretation:  Normal sinus rhythm Right bundle branch block Left  anterior fascicular block  Bifascicular block  Abnormal ECG No  significant change was found Confirmed by Lambros Cerro  MD, Lennette Bihari ([id number]) on  03/08/2014 8:02:16 PM      MDM   Final diagnoses:  Left foot pain  Chest pain, unspecified chest pain type  Neuropathy  Gait instability    Patient presents with a multitude of symptoms.  Regarding his chest pain his EKG and troponin are normal.  I do not believe this is ACS.  Regarding his left foot pain which is now completely resolved I suspect he may have had a cramp in his left foot.  He has normal pulses in his left foot and there is no signs of infection  at this time.  No signs of ischemia to the left foot.  I've asked that he follow-up with his primary care physician regarding his neuropathy.  Recommended physical therapy and occupational therapy as  well as possible vestibular therapy.    Hoy Morn, MD 03/09/14 [id number]

## 2014-03-09 NOTE — Progress Notes (Signed)
=    History of Present Illness: 81 yo WM with history of HTN, hyperlipidemia, morbid obesity, DM, CAD, atrial fibrillation and obstructive lung disease here for follow up. He was admitted to [facility] 06/18/11 with chest pain. Cardiac cath on 06/18/11 with moderate LAD disease with patent RCA stents. No flow limiting lesions. Normal LVEF. D Dimer and CE were normal. He was also noted to have tachycardia which was felt to be atrial fibrillation. Dr. Caryl Comes saw him in the hospital and started Xarelto for anticoagulation and increased metoprolol for rate control. Slurred speech and seen by Neuro. Head CT without evidence of CVA. Head MRI with questionable tiny acute infarct posterior left frontal lobe, mild small vessel disease type changes, global atrophy without hydrocephalus. Carotid artery dopplers 11/11/12 with mild (0-39%) bilateral ICA stenosis. He decided not to start Xarelto due to cost. He was started on coumadin and has been followed in the coumadin clinic. He was seen 12/23/12 and had c/o chest pressure when working in the yard. Stress myoview 01/13/13 with small inferior wall defect from base to apex with mild peri-infarct ischemia. LVEF=53%. Low risk study. Cath 01/27/13 with stable disease. Admitted to [facility] 02/03/13 with chest pain. Mild elevation troponin. Started on Imdur and Plavix.   He is here today for follow up. Still with daytime fatigue and somnolence but this is improving since he has had his blood sugar under better control. He has rare chest pains that last for several seconds. He also has dyspnea with minimal exertion, dizziness and gait instability. No exertional chest pain. No LE edema. His blood sugars have been 100-150. Not using insulin as recommended. Not following correct diet. No syncope. Isolated episode of sharp chest pain for five seconds last week.   Primary Care Physician: Carolann Littler  Last Lipid Profile:Lipid Panel     Component Value Date/Time   CHOL 211*  11/04/2013 0905   TRIG 445.0* 11/04/2013 0905   HDL 28.80* 11/04/2013 0905   CHOLHDL 7 11/04/2013 0905   VLDL 89.0* 11/04/2013 0905   LDLCALC 81 06/20/2011 0552     Past Medical History  Diagnosis Date  . Coronary artery disease     a. h/o Overlapping stents RCA;  b. 06/2011 Cath: patent stents, nonobs dzs, NL EF.  Marland Kitchen Hypertension   . Osteoarthritis     shoulder  . Restless leg   . Hyperlipidemia   . COPD (chronic obstructive pulmonary disease)   . Nephrolithiasis   . Dyspnea   . GERD (gastroesophageal reflux disease)   . Low testosterone   . SVT (supraventricular tachycardia)   . Atrial fibrillation   . DM (diabetes mellitus)     Type 2, peripheral neuropathy.  . Diabetic peripheral neuropathy     Past Surgical History  Procedure Laterality Date  . Cholecystectomy    . Cardiac catheterization  01/2013  . Coronary angioplasty  2004  . Back surgery  [place]  . Rotator cuff repair    . Left heart catheterization with coronary angiogram N/A 06/18/2011    Procedure: LEFT HEART CATHETERIZATION WITH CORONARY ANGIOGRAM;  Surgeon: Peter M Martinique, MD;  Location: [facility] CATH LAB;  Service: Cardiovascular;  Laterality: N/A;  . Left heart catheterization with coronary angiogram N/A 01/27/2013    Procedure: LEFT HEART CATHETERIZATION WITH CORONARY ANGIOGRAM;  Surgeon: Burnell Blanks, MD;  Location: [facility] CATH LAB;  Service: Cardiovascular;  Laterality: N/A;    Current Outpatient Prescriptions  Medication Sig Dispense Refill  . atorvastatin (  LIPITOR) 40 MG tablet TAKE 1 TABLET BY MOUTH DAILY 90 tablet 3  . Calcium Carbonate-Vitamin D (CALCIUM + D PO) Take 1 tablet by mouth daily.     . clonazePAM (KLONOPIN) 1 MG tablet Take 1 tablet (1 mg total) by mouth at bedtime. 30 tablet 5  . clopidogrel (PLAVIX) 75 MG tablet TAKE 1 TABLET DAILY WITH BREAKFAST 30 tablet 5  . clotrimazole (LOTRIMIN) 1 % cream Apply 1 application topically daily as needed. For rash    . fenofibrate micronized  (LOFIBRA) 134 MG capsule Take 1 capsule by mouth   daily before breakfast 90 capsule 3  . gabapentin (NEURONTIN) 300 MG capsule Take 2 capsules by mouth 3  times a day 540 capsule 3  . Insulin Glargine (TOUJEO SOLOSTAR) 300 UNIT/ML SOPN Inject 50 Units into the skin 2 (two) times daily. (Patient taking differently: Inject 60 Units into the skin 2 (two) times daily. ) 1.5 mL 5  . insulin lispro (HUMALOG) 100 UNIT/ML KiwkPen Inject 18-20 units with each meal. 15 mL 5  . metoprolol succinate (TOPROL-XL) 100 MG 24 hr tablet Take 1 tablet by mouth  every day immediately  following a meal 90 tablet 3  . MYRBETRIQ 50 MG TB24 tablet Take 50 mg by mouth daily. By mouth daily    . nitroGLYCERIN (NITROSTAT) 0.4 MG SL tablet Place 1 tablet (0.4 mg total) under the tongue every 5 (five) minutes as needed. Chest pain 25 tablet 6  . oxymetazoline (AFRIN) 0.05 % nasal spray Place 1 spray into both nostrils 2 (two) times daily.    . pantoprazole (PROTONIX) 40 MG tablet Take 40 mg by mouth daily.    . pramipexole (MIRAPEX) 1.5 MG tablet May take 1 to 1 and half tablets by mouth at bedtime. (Patient taking differently: Take 1.5 mg by mouth daily. ) 60 tablet 5  . tamsulosin (FLOMAX) 0.4 MG CAPS capsule Take 0.4 mg by mouth daily as needed. For fluid    . warfarin (COUMADIN) 5 MG tablet TAKE AS DIRECTED BY ANTICOAGULATION CLINIC 35 tablet 0  . ACCU-CHEK AVIVA PLUS test strip TEST 3 TIMES A DAY 100 each 3  . canagliflozin (INVOKANA) 100 MG TABS tablet Take 1 tablet (100 mg total) by mouth daily. 30 tablet 11  . glucose blood (ACCU-CHEK AVIVA PLUS) test strip TEST 3 TIMES A DAY 300 each 3  . [DISCONTINUED] metFORMIN (GLUCOPHAGE) 500 MG tablet Take 1 tablet (500 mg total) by mouth 2 (two) times daily with a meal. 60 tablet 1  . [DISCONTINUED] metoprolol (LOPRESSOR) 50 MG tablet     . [DISCONTINUED] spironolactone (ALDACTONE) 25 MG tablet      No current facility-administered medications for this visit.    Facility-Administered Medications Ordered in Other Visits  Medication Dose Route Frequency Provider Last Rate Last Dose  . testosterone cypionate (DEPOTESTOTERONE CYPIONATE) injection 200 mg  200 mg Intramuscular Q28 days [name], MD   200 mg at 12/26/11 0850  . testosterone cypionate (DEPOTESTOTERONE CYPIONATE) injection 200 mg  200 mg Intramuscular Q28 days [name], MD   200 mg at 01/23/12 0908  . testosterone cypionate (DEPOTESTOTERONE CYPIONATE) injection 200 mg  200 mg Intramuscular Q28 days [name], MD   200 mg at 02/25/12 0848  . testosterone cypionate (DEPOTESTOTERONE CYPIONATE) injection 200 mg  200 mg Intramuscular Q28 days [name], MD   200 mg at 06/03/12 1437    Allergies  Allergen Reactions  . Ace Inhibitors Other (See  Comments)    cough  . Codeine Nausea Only and Rash       . Penicillins Rash    History   Social History  . Marital Status: Married    Spouse Name: N/A  . Number of Children: 3  . Years of Education: N/A   Occupational History  . Retired    Social History Main Topics  . Smoking status: Former Smoker -- 1.50 packs/day for 20 years    Types: Cigarettes    Quit date: 04/05/1983  . Smokeless tobacco: Never Used  . Alcohol Use: No  . Drug Use: No  . Sexual Activity: Not Currently   Other Topics Concern  . Not on file   Social History Narrative    Family History  Problem Relation Age of Onset  . Coronary artery disease      Male 1st degree relative <50  . Coronary artery disease      male 1st degree relative <60  . Heart disease Father   . Migraines Father   . Ulcers Father   . Alzheimer's disease Mother   . Heart disease Mother   . Heart disease Sister   . Obesity Sister     Morbid  . Arthritis Sister   . Heart disease Brother   . Arthritis Brother   . Sleep apnea Son   . Obesity Son   . Migraines Daughter   . Thyroid disease Daughter     Review of Systems:  As stated in the HPI and  otherwise negative.   BP 128/72 mmHg  Pulse 76  Ht 5\' 10"  (1.778 m)  Wt 273 lb (123.832 kg)  BMI 39.17 kg/m2  Physical Examination: General: Well developed, well nourished, NAD HEENT: OP clear, mucus membranes moist SKIN: warm, dry. No rashes. Neuro: No focal deficits Musculoskeletal: Muscle strength 5/5 all ext Psychiatric: Mood and affect normal Neck: No JVD, no carotid bruits, no thyromegaly, no lymphadenopathy. Lungs:Clear bilaterally, no wheezes, rhonci, crackles Cardiovascular: Regular rate and rhythm. No murmurs, gallops or rubs. Abdomen:Soft. Bowel sounds present. Non-tender.  Extremities: No lower extremity edema. Pulses are 2 + in the bilateral DP/PT.  Stress myoview 01/13/13: Stress Procedure: The patient received IV Lexiscan 0.4 mg over 15-seconds. Technetium 11m Sestamibi injected at 30-seconds. Quantitative spect images were obtained after a 45 minute delay. During the infusion of Lexiscan, the patient complained of a "strange" feeling and a headache. These symptoms resolved in recovery.  Stress ECG: No significant change from baseline ECG  QPS  Raw Data Images: Normal; no motion artifact; normal heart/lung ratio.  Stress Images: There is decreased uptake in the inferior wall.  Rest Images: There is decreased uptake in the inferior wall.  Subtraction (SDS): mixed infarct and ischemia  Transient Ischemic Dilatation (Normal <1.22): 0.98  Lung/Heart Ratio (Normal <0.45): 0.40  Quantitative Gated Spect Images  QGS EDV: 126 ml  QGS ESV: 54 ml  Impression  Exercise Capacity: Lexiscan with no exercise.  BP Response: Normal blood pressure response.  Clinical Symptoms: Headache  ECG Impression: No significant ST segment change suggestive of ischemia.  Comparison with Prior Nuclear Study: No images to compare  Overall Impression: Low risk stress nuclear study Small inferior wall infarct from apex to base with mild peri infarct ischemia.  LV Ejection Fraction: 57%. LV Wall  Motion: NL LV Function; NL Wall Motion  Cardiac cath 01/27/13: Left main: No obstructive disease.  Left Anterior Descending Artery: Large caliber vessel in the proximal segment with trifurcation in the  mid segment into a large septal perforating branch, moderate caliber bifurcating diagonal branch and small to moderate caliber continuation of the LAD. There appears to be a 40-50% stenosis at the trifurcation in the mid LAD. The diagonal branch bifurcates. The superior branch of the diagonal after the bifurcation is small in caliber with 70% stenosis.  Circumflex Artery: Large caliber vessel with large caliber obtuse marginal branch. The proximal and mid vessel has diffuse 30% stenosis, unchanged. The obtuse marginal branch has diffuse 30% stenosis in the proximal segment of the vessel.  Right Coronary Artery: Large caliber dominant vessel with patent proximal and mid stented segment with 20% stent restenosis. Mild plaque in the large caliber PDA.   Echo 01/26/14: Left ventricle: The cavity size was normal. Systolic function was normal. The estimated ejection fraction was in the range of 55% to 60%. Wall motion was normal; there were no regional wall motion abnormalities. Doppler parameters are consistent with abnormal left ventricular relaxation (grade 1 diastolic dysfunction). - Left atrium: The atrium was mildly dilated. - Atrial septum: No defect or patent foramen ovale was identified.  EKG: NSR, RBBB. LAFB.   Assessment and Plan:   1. CORONARY ARTERY DISEASE: Stable. I do not think his dyspnea is related to his mild to moderate CAD. He is known to have moderate LAD stenosis and patent stents RCA by cath January 2015. Echo 01/26/14 with normal LV function. Continue Plavix, beta blocker, statin.  No ASA since he is also on coumadin.   2. Paroxysmal atrial fibrillation: He is maintaining NSR. He did not want to start Xarelto due to cost. He has been maintained on coumadin therapy.  Will continue Toprol at current dose.   3. Sleep apnea: He snores loudly at night, has excessive daytime fatigue with somnolence but trouble sleeping at night. Referral to Pulmonary but pt did not keep this appt.   4. Dyspnea: Non-cardiac. LV function normal by echo January 2016. NO valve disease. Likely related to his weight and deconditioning.   5.  Diabetes mellitus: Followed in primary care

## 2014-03-10 ENCOUNTER — Ambulatory Visit: Payer: Medicare Other | Admitting: Family Medicine

## 2014-03-10 ENCOUNTER — Ambulatory Visit: Payer: Medicare Other

## 2014-03-14 ENCOUNTER — Ambulatory Visit: Payer: Medicare Other | Admitting: Family Medicine

## 2014-03-17 ENCOUNTER — Ambulatory Visit (INDEPENDENT_AMBULATORY_CARE_PROVIDER_SITE_OTHER): Payer: Medicare Other | Admitting: Family Medicine

## 2014-03-17 ENCOUNTER — Encounter: Payer: Self-pay | Admitting: Family Medicine

## 2014-03-17 ENCOUNTER — Ambulatory Visit (INDEPENDENT_AMBULATORY_CARE_PROVIDER_SITE_OTHER): Payer: Medicare Other | Admitting: Family

## 2014-03-17 VITALS — BP 126/66 | HR 80 | Temp 97.3°F | Wt 277.0 lb

## 2014-03-17 DIAGNOSIS — I480 Paroxysmal atrial fibrillation: Secondary | ICD-10-CM

## 2014-03-17 DIAGNOSIS — E1140 Type 2 diabetes mellitus with diabetic neuropathy, unspecified: Secondary | ICD-10-CM

## 2014-03-17 DIAGNOSIS — E1165 Type 2 diabetes mellitus with hyperglycemia: Principal | ICD-10-CM

## 2014-03-17 DIAGNOSIS — Z5181 Encounter for therapeutic drug level monitoring: Secondary | ICD-10-CM | POA: Diagnosis not present

## 2014-03-17 DIAGNOSIS — R296 Repeated falls: Secondary | ICD-10-CM

## 2014-03-17 DIAGNOSIS — Z9181 History of falling: Secondary | ICD-10-CM

## 2014-03-17 DIAGNOSIS — G2581 Restless legs syndrome: Secondary | ICD-10-CM

## 2014-03-17 LAB — POCT INR: INR: 1.8

## 2014-03-17 NOTE — Progress Notes (Signed)
Pre visit review using our clinic review tool, if applicable. No additional management support is needed unless otherwise documented below in the visit note. 

## 2014-03-17 NOTE — Patient Instructions (Signed)
Take 7.5 mg on Wednesdays and Fridays. 5mg  all other days. Recheck in 4 weeks.   Anticoagulation Dose Instructions as of 03/17/2014      Gary Yates Tue Wed Thu Fri Sat   New Dose 5 mg 5 mg 5 mg 7.5 mg 5 mg 7.5 mg 5 mg    Description        Take 7.5 mg on Wednesdays and Fridays. 5mg  all other days. Recheck in 4 weeks.

## 2014-03-17 NOTE — Progress Notes (Signed)
Subjective:    Patient ID: Gary Marchi., male    DOB: 1933/06/11, 81 y.o.   MRN: [id number]  HPI Patient seen for medical follow-up. Poorly controlled type 2 diabetes. History of poor compliance though he has stepped up compliance with medications during the past couple of months. He is now taking his insulin consistently. Recent fasting blood sugars ranging 112-167. He did not bring in log today for review. He's had a couple of low sugars around 80 and generally feels poorly when he gets this low.  He complains of dizziness. He has scaled back his gabapentin gradually. He has severe neuropathy which is impairing his balance significantly. He normally ambulates with cane. Denies any recent actual falls. Not describing any orthostatic symptoms. He has severe restless leg syndrome requiring Klonopin and Mirapex.  Past Medical History  Diagnosis Date  . Coronary artery disease     a. h/o Overlapping stents RCA;  b. 06/2011 Cath: patent stents, nonobs dzs, NL EF.  Marland Kitchen Hypertension   . Osteoarthritis     shoulder  . Restless leg   . Hyperlipidemia   . COPD (chronic obstructive pulmonary disease)   . Nephrolithiasis   . Dyspnea   . GERD (gastroesophageal reflux disease)   . Low testosterone   . SVT (supraventricular tachycardia)   . Atrial fibrillation   . DM (diabetes mellitus)     Type 2, peripheral neuropathy.  . Diabetic peripheral neuropathy    Past Surgical History  Procedure Laterality Date  . Cholecystectomy    . Cardiac catheterization  01/2013  . Coronary angioplasty  2004  . Back surgery  [place]  . Rotator cuff repair    . Left heart catheterization with coronary angiogram N/A 06/18/2011    Procedure: LEFT HEART CATHETERIZATION WITH CORONARY ANGIOGRAM;  Surgeon: Peter M Martinique, MD;  Location: [facility] CATH LAB;  Service: Cardiovascular;  Laterality: N/A;  . Left heart catheterization with coronary angiogram N/A 01/27/2013    Procedure: LEFT HEART CATHETERIZATION WITH  CORONARY ANGIOGRAM;  Surgeon: Burnell Blanks, MD;  Location: [facility] CATH LAB;  Service: Cardiovascular;  Laterality: N/A;    reports that he quit smoking about 30 years ago. His smoking use included Cigarettes. He has a 30 pack-year smoking history. He has never used smokeless tobacco. He reports that he does not drink alcohol or use illicit drugs. family history includes Alzheimer's disease in his mother; Arthritis in his brother and sister; Coronary artery disease in some other family members; Heart disease in his brother, father, mother, and sister; Migraines in his daughter and father; Obesity in his sister and son; Sleep apnea in his son; Thyroid disease in his daughter; Ulcers in his father. Allergies  Allergen Reactions  . Ace Inhibitors Other (See Comments)    cough  . Codeine Nausea Only and Rash       . Penicillins Rash      Review of Systems  Constitutional: Negative for fatigue.  Eyes: Negative for visual disturbance.  Respiratory: Negative for cough, chest tightness and shortness of breath.   Cardiovascular: Negative for chest pain, palpitations and leg swelling.  Neurological: Positive for dizziness. Negative for syncope, weakness, light-headedness and headaches.       Objective:   Physical Exam  Constitutional: He is oriented to person, place, and time. He appears well-developed and well-nourished.  Cardiovascular: Normal rate and regular rhythm.   Pulmonary/Chest: Effort normal and breath sounds normal. No respiratory distress. He has no wheezes. He has no  rales.  Musculoskeletal: He exhibits no edema.  Neurological: He is alert and oriented to person, place, and time. No cranial nerve deficit.  Psychiatric: He has a normal mood and affect. His behavior is normal.          Assessment & Plan:  #1 type 2 diabetes. History of very poor control and very poor compliance. He has improved compliance with medications recently and home blood sugars are improving.  Recheck A1c in one month  # 2 high risk for falls. Largely related to his peripheral neuropathy.  He is encouraged to ambulate with cane at all times #3 Restless leg syndrome. Improved with Mirapex and Klonopin. He is aware that these types of medications can increase risk for falls but he's had very severe restless leg symptoms that have not been controlled without medication.

## 2014-04-04 ENCOUNTER — Other Ambulatory Visit: Payer: Self-pay | Admitting: Family Medicine

## 2014-04-14 ENCOUNTER — Other Ambulatory Visit (INDEPENDENT_AMBULATORY_CARE_PROVIDER_SITE_OTHER): Payer: Medicare Other

## 2014-04-14 ENCOUNTER — Ambulatory Visit (INDEPENDENT_AMBULATORY_CARE_PROVIDER_SITE_OTHER): Payer: Medicare Other | Admitting: General Practice

## 2014-04-14 DIAGNOSIS — Z5181 Encounter for therapeutic drug level monitoring: Secondary | ICD-10-CM | POA: Diagnosis not present

## 2014-04-14 DIAGNOSIS — E1165 Type 2 diabetes mellitus with hyperglycemia: Principal | ICD-10-CM

## 2014-04-14 DIAGNOSIS — E1140 Type 2 diabetes mellitus with diabetic neuropathy, unspecified: Secondary | ICD-10-CM

## 2014-04-14 DIAGNOSIS — I480 Paroxysmal atrial fibrillation: Secondary | ICD-10-CM | POA: Diagnosis not present

## 2014-04-14 LAB — HEMOGLOBIN A1C: HEMOGLOBIN A1C: 9.8 % — AB (ref 4.6–6.5)

## 2014-04-14 LAB — POCT INR: INR: 1.7

## 2014-04-14 NOTE — Progress Notes (Signed)
Pre visit review using our clinic review tool, if applicable. No additional management support is needed unless otherwise documented below in the visit note. 

## 2014-04-19 ENCOUNTER — Other Ambulatory Visit: Payer: Self-pay | Admitting: General Practice

## 2014-04-19 ENCOUNTER — Other Ambulatory Visit: Payer: Self-pay | Admitting: Family Medicine

## 2014-04-20 LAB — HM DIABETES EYE EXAM

## 2014-04-21 ENCOUNTER — Ambulatory Visit: Payer: Medicare Other | Admitting: Family Medicine

## 2014-04-29 ENCOUNTER — Ambulatory Visit: Payer: Medicare Other | Admitting: Family Medicine

## 2014-05-05 ENCOUNTER — Encounter: Payer: Self-pay | Admitting: Family Medicine

## 2014-05-05 ENCOUNTER — Ambulatory Visit (INDEPENDENT_AMBULATORY_CARE_PROVIDER_SITE_OTHER): Payer: Medicare Other | Admitting: Family Medicine

## 2014-05-05 VITALS — BP 130/66 | HR 96 | Temp 97.3°F | Wt 279.0 lb

## 2014-05-05 DIAGNOSIS — Z9181 History of falling: Secondary | ICD-10-CM | POA: Insufficient documentation

## 2014-05-05 DIAGNOSIS — R296 Repeated falls: Secondary | ICD-10-CM

## 2014-05-05 DIAGNOSIS — E1140 Type 2 diabetes mellitus with diabetic neuropathy, unspecified: Secondary | ICD-10-CM

## 2014-05-05 DIAGNOSIS — E1165 Type 2 diabetes mellitus with hyperglycemia: Principal | ICD-10-CM

## 2014-05-05 MED ORDER — CANAGLIFLOZIN 300 MG PO TABS: 300.0000 mg | ORAL_TABLET | Freq: Every day | ORAL | Status: DC

## 2014-05-05 NOTE — Progress Notes (Signed)
Subjective:    Patient ID: Gary Lenhard., male    DOB: 16-Apr-1933, 81 y.o.   MRN: [id number]  HPI Medical follow-up  Poorly controlled diabetes. Poor compliance history. He has stepped up compliance some recently-both with diet and medications, though still occasionally skips his insulin. A1c went from 16.3 to 9.8%. He is taking Invokana along with insulin.  Still has poor compliance with diet at times. His fasting blood sugars been fairly consistently elevated. He remains on Toujeo insulin 60 units twice daily and mealtime Humalog. No recent hypoglycemia.  High risk for falls. He has some visual changes and is getting ready to have some cataract surgery. He has peripheral neuropathy which is fairly advanced which I think is a major controlling factor to his risk for falls. Ambulating with cane. He is on medications which could contribute to risk such as gabapentin and Klonopin but he has severe neuropathy pain and has been unable to further taper gabapentin. He also has severe restless leg symptoms and has been unable to taper back Klonopin or Mirapex.  Past Medical History  Diagnosis Date  . Coronary artery disease     a. h/o Overlapping stents RCA;  b. 06/2011 Cath: patent stents, nonobs dzs, NL EF.  Marland Kitchen Hypertension   . Osteoarthritis     shoulder  . Restless leg   . Hyperlipidemia   . COPD (chronic obstructive pulmonary disease)   . Nephrolithiasis   . Dyspnea   . GERD (gastroesophageal reflux disease)   . Low testosterone   . SVT (supraventricular tachycardia)   . Atrial fibrillation   . DM (diabetes mellitus)     Type 2, peripheral neuropathy.  . Diabetic peripheral neuropathy    Past Surgical History  Procedure Laterality Date  . Cholecystectomy    . Cardiac catheterization  01/2013  . Coronary angioplasty  2004  . Back surgery  [place]  . Rotator cuff repair    . Left heart catheterization with coronary angiogram N/A 06/18/2011    Procedure: LEFT HEART  CATHETERIZATION WITH CORONARY ANGIOGRAM;  Surgeon: Peter M Martinique, MD;  Location: [facility] CATH LAB;  Service: Cardiovascular;  Laterality: N/A;  . Left heart catheterization with coronary angiogram N/A 01/27/2013    Procedure: LEFT HEART CATHETERIZATION WITH CORONARY ANGIOGRAM;  Surgeon: Burnell Blanks, MD;  Location: [facility] CATH LAB;  Service: Cardiovascular;  Laterality: N/A;    reports that he quit smoking about 31 years ago. His smoking use included Cigarettes. He has a 30 pack-year smoking history. He has never used smokeless tobacco. He reports that he does not drink alcohol or use illicit drugs. family history includes Alzheimer's disease in his mother; Arthritis in his brother and sister; Coronary artery disease in some other family members; Heart disease in his brother, father, mother, and sister; Migraines in his daughter and father; Obesity in his sister and son; Sleep apnea in his son; Thyroid disease in his daughter; Ulcers in his father. Allergies  Allergen Reactions  . Ace Inhibitors Other (See Comments)    cough  . Codeine Nausea Only and Rash       . Penicillins Rash      Review of Systems  Constitutional: Negative for fatigue.  Eyes: Negative for visual disturbance.  Respiratory: Negative for cough, chest tightness and shortness of breath.   Cardiovascular: Negative for chest pain, palpitations and leg swelling.  Neurological: Negative for dizziness, syncope, weakness, light-headedness and headaches.       Objective:   Physical  Exam  Constitutional: He is oriented to person, place, and time. He appears well-developed and well-nourished.  HENT:  Right Ear: External ear normal.  Left Ear: External ear normal.  Mouth/Throat: Oropharynx is clear and moist.  Eyes: Pupils are equal, round, and reactive to light.  Neck: Neck supple. No thyromegaly present.  Cardiovascular: Normal rate and regular rhythm.   Pulmonary/Chest: Effort normal and breath sounds normal. No  respiratory distress. He has no wheezes. He has no rales.  Musculoskeletal: He exhibits edema.  Trace edema legs bilaterally  Neurological: He is alert and oriented to person, place, and time.          Assessment & Plan:  #1 type 2 diabetes. History of poor control and poor compliance but improving. Titrate Invokana 300 mg once daily. Recheck A1c in 3 months.  He continues to snack on high carbohydrate foods and is encouraged to improve compliance in that area #2 high risk for falls. Set up physical therapy for further evaluation and treatment. Likely multifactorial with his arthritis, peripheral neuropathy, visual difficulties. Agree with cane use at all times.

## 2014-05-05 NOTE — Patient Instructions (Signed)
We will call you regarding physical therapy for fall prevention

## 2014-05-05 NOTE — Progress Notes (Signed)
Pre visit review using our clinic review tool, if applicable. No additional management support is needed unless otherwise documented below in the visit note. 

## 2014-05-06 ENCOUNTER — Other Ambulatory Visit: Payer: Self-pay | Admitting: Family Medicine

## 2014-05-06 ENCOUNTER — Telehealth: Payer: Self-pay | Admitting: Family Medicine

## 2014-05-06 NOTE — Telephone Encounter (Signed)
Left message for patient to return call.

## 2014-05-06 NOTE — Telephone Encounter (Signed)
Not in that class.  Does he still have any 100 mg Invokana left?

## 2014-05-06 NOTE — Telephone Encounter (Signed)
Pt called to say that he is not taking the following med   canagliflozin (INVOKANA) 300 MG TABS tablet. Said the medicine was to expensive and is asking if there is something else he can take.   phamacy  ; [facility] [place]

## 2014-05-09 NOTE — Telephone Encounter (Signed)
Pt states that he does not have any 100mg  Invokana

## 2014-05-09 NOTE — Telephone Encounter (Signed)
We do not have any generic alternatives that he can take.

## 2014-05-10 NOTE — Telephone Encounter (Signed)
Pt informed

## 2014-05-12 ENCOUNTER — Ambulatory Visit (INDEPENDENT_AMBULATORY_CARE_PROVIDER_SITE_OTHER): Payer: Medicare Other | Admitting: General Practice

## 2014-05-12 ENCOUNTER — Encounter: Payer: Self-pay | Admitting: Family Medicine

## 2014-05-12 ENCOUNTER — Ambulatory Visit (INDEPENDENT_AMBULATORY_CARE_PROVIDER_SITE_OTHER): Payer: Medicare Other | Admitting: Family Medicine

## 2014-05-12 VITALS — BP 124/80 | Temp 98.2°F | Wt 280.0 lb

## 2014-05-12 DIAGNOSIS — Z5181 Encounter for therapeutic drug level monitoring: Secondary | ICD-10-CM | POA: Diagnosis not present

## 2014-05-12 DIAGNOSIS — E1140 Type 2 diabetes mellitus with diabetic neuropathy, unspecified: Secondary | ICD-10-CM

## 2014-05-12 DIAGNOSIS — I480 Paroxysmal atrial fibrillation: Secondary | ICD-10-CM

## 2014-05-12 DIAGNOSIS — E1165 Type 2 diabetes mellitus with hyperglycemia: Principal | ICD-10-CM

## 2014-05-12 LAB — POCT INR: INR: 1.5

## 2014-05-12 NOTE — Progress Notes (Signed)
Pre visit review using our clinic review tool, if applicable. No additional management support is needed unless otherwise documented below in the visit note. 

## 2014-05-12 NOTE — Progress Notes (Signed)
   Subjective:    Patient ID: Gary Yates., male    DOB: 1933-08-23, 81 y.o.   MRN: [id number]  HPI Patient seen to discuss diabetes. Recent A1c greatly improved from over 16-9.8. He's done this largely with his efforts. We recommended increasing Invokana 300 mg and apparently is not taking this at all. He is struggling because of cost issues whether he will get this filled. He has made some dietary changes. Very limited exercise.  Past Medical History  Diagnosis Date  . Coronary artery disease     a. h/o Overlapping stents RCA;  b. 06/2011 Cath: patent stents, nonobs dzs, NL EF.  Marland Kitchen Hypertension   . Osteoarthritis     shoulder  . Restless leg   . Hyperlipidemia   . COPD (chronic obstructive pulmonary disease)   . Nephrolithiasis   . Dyspnea   . GERD (gastroesophageal reflux disease)   . Low testosterone   . SVT (supraventricular tachycardia)   . Atrial fibrillation   . DM (diabetes mellitus)     Type 2, peripheral neuropathy.  . Diabetic peripheral neuropathy    Past Surgical History  Procedure Laterality Date  . Cholecystectomy    . Cardiac catheterization  01/2013  . Coronary angioplasty  2004  . Back surgery  [place]  . Rotator cuff repair    . Left heart catheterization with coronary angiogram N/A 06/18/2011    Procedure: LEFT HEART CATHETERIZATION WITH CORONARY ANGIOGRAM;  Surgeon: Peter M Martinique, MD;  Location: [facility] CATH LAB;  Service: Cardiovascular;  Laterality: N/A;  . Left heart catheterization with coronary angiogram N/A 01/27/2013    Procedure: LEFT HEART CATHETERIZATION WITH CORONARY ANGIOGRAM;  Surgeon: Burnell Blanks, MD;  Location: [facility] CATH LAB;  Service: Cardiovascular;  Laterality: N/A;    reports that he quit smoking about 31 years ago. His smoking use included Cigarettes. He has a 30 pack-year smoking history. He has never used smokeless tobacco. He reports that he does not drink alcohol or use illicit drugs. family history includes Alzheimer's  disease in his mother; Arthritis in his brother and sister; Coronary artery disease in some other family members; Heart disease in his brother, father, mother, and sister; Migraines in his daughter and father; Obesity in his sister and son; Sleep apnea in his son; Thyroid disease in his daughter; Ulcers in his father. Allergies  Allergen Reactions  . Ace Inhibitors Other (See Comments)    cough  . Codeine Nausea Only and Rash       . Penicillins Rash      Review of Systems  Respiratory: Negative for shortness of breath.   Cardiovascular: Negative for chest pain.  Endocrine: Negative for polydipsia and polyuria.       Objective:   Physical Exam  Constitutional: He appears well-developed and well-nourished.  Cardiovascular: Normal rate and regular rhythm.   Pulmonary/Chest: Effort normal and breath sounds normal. No respiratory distress. He has no wheezes. He has no rales.  Musculoskeletal: He exhibits no edema.          Assessment & Plan:  Type 2 diabetes poorly controlled with peripheral neuropathy. Patient will start Invokana 300 mg once daily. He has follow-up in July and recheck A1c then. He has physical therapy pending to evaluate for fall risk

## 2014-05-12 NOTE — Progress Notes (Signed)
Pre visit review using our clinic review tool, if applicable. No additional management support is needed unless otherwise documented below in the visit note. Lab Results  Component Value Date   HGBA1C 9.8* 04/14/2014   HGBA1C 16.3* 01/26/2014   HGBA1C 13.5* 11/04/2013   Lab Results  Component Value Date   MICROALBUR 6.9* 08/14/2009   LDLCALC 81 06/20/2011   CREATININE 1.41* 03/08/2014

## 2014-05-15 ENCOUNTER — Other Ambulatory Visit: Payer: Self-pay | Admitting: Family Medicine

## 2014-05-16 ENCOUNTER — Other Ambulatory Visit: Payer: Self-pay | Admitting: General Practice

## 2014-05-16 MED ORDER — WARFARIN SODIUM 5 MG PO TABS: ORAL_TABLET | ORAL | Status: DC

## 2014-05-19 ENCOUNTER — Encounter: Payer: Self-pay | Admitting: Gastroenterology

## 2014-05-20 ENCOUNTER — Other Ambulatory Visit: Payer: Self-pay | Admitting: Family Medicine

## 2014-05-20 NOTE — Telephone Encounter (Signed)
Last visit 05/12/14 Last refill 11/04/13 #30 5 refill

## 2014-05-20 NOTE — Telephone Encounter (Signed)
Refill for 6 months. 

## 2014-05-26 ENCOUNTER — Ambulatory Visit: Payer: Medicare Other

## 2014-06-20 ENCOUNTER — Telehealth: Payer: Self-pay | Admitting: Family Medicine

## 2014-06-20 NOTE — Telephone Encounter (Signed)
Pt req samples of the following meds  HUMALOG and TOUJEO

## 2014-06-21 NOTE — Telephone Encounter (Signed)
Pt following up on sample request. Almost out. pls call on cell phone as he will be out and about. [phone]

## 2014-06-21 NOTE — Telephone Encounter (Signed)
Pt aware that samples are ready for pick up  

## 2014-06-28 ENCOUNTER — Telehealth: Payer: Self-pay | Admitting: Family Medicine

## 2014-06-28 NOTE — Telephone Encounter (Signed)
Pt called to say that he can not afford the following medicine canagliflozin (INVOKANA) 300 MG TABS tablet. He is asking if there is something else he can try.   Pharmacy [facility] [place]

## 2014-06-28 NOTE — Telephone Encounter (Signed)
Going to give patient the $0 co-pay to see if that helps him.

## 2014-06-28 NOTE — Telephone Encounter (Signed)
There are no generics in that class.  Unless he has better insurance coverage for another in that class we have no alternatives.

## 2014-06-29 ENCOUNTER — Ambulatory Visit: Payer: Medicare Other | Admitting: Physical Therapy

## 2014-07-13 ENCOUNTER — Telehealth: Payer: Self-pay | Admitting: Family Medicine

## 2014-07-13 NOTE — Telephone Encounter (Addendum)
Pt needs more samples of humalog qwikpen

## 2014-07-13 NOTE — Telephone Encounter (Signed)
Pt aware that samples are ready for pick up  

## 2014-07-14 ENCOUNTER — Ambulatory Visit: Payer: Medicare Other | Admitting: Physical Therapy

## 2014-07-24 ENCOUNTER — Other Ambulatory Visit: Payer: Self-pay | Admitting: Family Medicine

## 2014-08-02 ENCOUNTER — Ambulatory Visit: Payer: Medicare Other | Admitting: Physical Therapy

## 2014-08-04 ENCOUNTER — Other Ambulatory Visit (INDEPENDENT_AMBULATORY_CARE_PROVIDER_SITE_OTHER): Payer: Medicare Other

## 2014-08-04 ENCOUNTER — Ambulatory Visit (INDEPENDENT_AMBULATORY_CARE_PROVIDER_SITE_OTHER): Payer: Medicare Other | Admitting: General Practice

## 2014-08-04 DIAGNOSIS — Z5181 Encounter for therapeutic drug level monitoring: Secondary | ICD-10-CM | POA: Diagnosis not present

## 2014-08-04 DIAGNOSIS — I480 Paroxysmal atrial fibrillation: Secondary | ICD-10-CM

## 2014-08-04 DIAGNOSIS — E1165 Type 2 diabetes mellitus with hyperglycemia: Principal | ICD-10-CM

## 2014-08-04 DIAGNOSIS — E1140 Type 2 diabetes mellitus with diabetic neuropathy, unspecified: Secondary | ICD-10-CM | POA: Diagnosis not present

## 2014-08-04 LAB — POCT INR: INR: 1.6

## 2014-08-04 LAB — HEMOGLOBIN A1C: HEMOGLOBIN A1C: 9.3 % — AB (ref 4.6–6.5)

## 2014-08-04 NOTE — Progress Notes (Signed)
Agree with plan 

## 2014-08-04 NOTE — Progress Notes (Signed)
Pre visit review using our clinic review tool, if applicable. No additional management support is needed unless otherwise documented below in the visit note. 

## 2014-08-15 ENCOUNTER — Telehealth: Payer: Self-pay | Admitting: Family Medicine

## 2014-08-15 NOTE — Telephone Encounter (Signed)
Patient called to see if any samples of Myrbetriq 50 mg and Invokana 300 mg. Pt said that he is in [place] and can not get his meds. Please call patient to let him know if samples are available.

## 2014-08-15 NOTE — Telephone Encounter (Signed)
No samples of either medication.

## 2014-08-15 NOTE — Telephone Encounter (Signed)
Okay if available

## 2014-08-15 NOTE — Telephone Encounter (Signed)
Left message on VM that there are no samples but we do have vouchers.

## 2014-08-24 ENCOUNTER — Encounter: Payer: Medicare Other | Admitting: Physical Therapy

## 2014-09-01 ENCOUNTER — Ambulatory Visit: Payer: Medicare Other

## 2014-09-02 ENCOUNTER — Other Ambulatory Visit: Payer: Self-pay | Admitting: Family Medicine

## 2014-09-02 ENCOUNTER — Ambulatory Visit: Payer: Medicare Other | Admitting: Family Medicine

## 2014-09-29 ENCOUNTER — Other Ambulatory Visit: Payer: Self-pay | Admitting: Family Medicine

## 2014-10-03 ENCOUNTER — Ambulatory Visit: Payer: Medicare Other | Attending: Family Medicine | Admitting: Physical Therapy

## 2014-10-03 ENCOUNTER — Encounter: Payer: Self-pay | Admitting: Physical Therapy

## 2014-10-03 DIAGNOSIS — M259 Joint disorder, unspecified: Secondary | ICD-10-CM | POA: Diagnosis present

## 2014-10-03 DIAGNOSIS — R2681 Unsteadiness on feet: Secondary | ICD-10-CM | POA: Diagnosis present

## 2014-10-03 DIAGNOSIS — R29818 Other symptoms and signs involving the nervous system: Secondary | ICD-10-CM | POA: Insufficient documentation

## 2014-10-03 DIAGNOSIS — M623 Immobility syndrome (paraplegic): Secondary | ICD-10-CM | POA: Insufficient documentation

## 2014-10-03 DIAGNOSIS — R269 Unspecified abnormalities of gait and mobility: Secondary | ICD-10-CM

## 2014-10-03 DIAGNOSIS — M2560 Stiffness of unspecified joint, not elsewhere classified: Secondary | ICD-10-CM

## 2014-10-03 DIAGNOSIS — R2689 Other abnormalities of gait and mobility: Secondary | ICD-10-CM

## 2014-10-03 DIAGNOSIS — R6889 Other general symptoms and signs: Secondary | ICD-10-CM | POA: Diagnosis present

## 2014-10-03 DIAGNOSIS — R29898 Other symptoms and signs involving the musculoskeletal system: Secondary | ICD-10-CM

## 2014-10-03 DIAGNOSIS — Z7409 Other reduced mobility: Secondary | ICD-10-CM

## 2014-10-03 NOTE — Therapy (Signed)
[facility] [address] [place], [place], [address] Phone: [phone]   Fax:  [phone]  Physical Therapy Evaluation  Patient Details  Name: Gary Yates. MRN: [id number] Date of Birth: 05-01-1933 Referring Provider:  [name], MD  Encounter Date: 10/03/2014      PT End of Session - 10/03/14 1025    Visit Number 1   Number of Visits 18   Date for PT Re-Evaluation 12/02/14   Authorization Type G-Code & progress report every 10th visit   PT Start Time 0915   PT Stop Time 1000   PT Time Calculation (min) 45 min   Equipment Utilized During Treatment Gait belt   Activity Tolerance Patient tolerated treatment well   Behavior During Therapy [facility] for tasks assessed/performed      Past Medical History  Diagnosis Date  . Coronary artery disease     a. h/o Overlapping stents RCA;  b. 06/2011 Cath: patent stents, nonobs dzs, NL EF.  Marland Kitchen Hypertension   . Osteoarthritis     shoulder  . Restless leg   . Hyperlipidemia   . COPD (chronic obstructive pulmonary disease)   . Nephrolithiasis   . Dyspnea   . GERD (gastroesophageal reflux disease)   . Low testosterone   . SVT (supraventricular tachycardia)   . Atrial fibrillation   . DM (diabetes mellitus)     Type 2, peripheral neuropathy.  . Diabetic peripheral neuropathy     Past Surgical History  Procedure Laterality Date  . Cholecystectomy    . Cardiac catheterization  01/2013  . Coronary angioplasty  2004  . Back surgery  [place]  . Rotator cuff repair    . Left heart catheterization with coronary angiogram N/A 06/18/2011    Procedure: LEFT HEART CATHETERIZATION WITH CORONARY ANGIOGRAM;  Surgeon: Peter M Martinique, MD;  Location: [facility] CATH LAB;  Service: Cardiovascular;  Laterality: N/A;  . Left heart catheterization with coronary angiogram N/A 01/27/2013    Procedure: LEFT HEART CATHETERIZATION WITH CORONARY ANGIOGRAM;  Surgeon: Burnell Blanks,  MD;  Location: [facility] CATH LAB;  Service: Cardiovascular;  Laterality: N/A;    There were no vitals filed for this visit.  Visit Diagnosis:  Abnormality of gait  Unsteadiness  Ankle weakness  Balance problems  Decreased functional activity tolerance  Stiffness due to immobility      Subjective Assessment - 10/03/14 0919    Subjective This 81yo male reports increased incidents of balance losses. He presents today for PT evaluation.    Patient Stated Goals He wants to move around and walk without fear of stubbling or falling, losing balance.   Currently in Pain? No/denies            [facility] PT Assessment - 10/03/14 0915    Assessment   Medical Diagnosis Gait Abnormality, falls   Precautions   Precautions Fall   Restrictions   Weight Bearing Restrictions No   Balance Screen   Has the patient fallen in the past 6 months Yes   How many times? 1  reaching to ground   Has the patient had a decrease in activity level because of a fear of falling?  No   Is the patient reluctant to leave their home because of a fear of falling?  No   Home Environment   Living Environment Private residence   Living Arrangements Spouse/significant other   Type of [place] to enter   Entrance Stairs-Number  of Steps 2   Entrance Stairs-Rails Right  post on left   Home Layout Two level;1/2 bath on main level  guest bedroom downstairs   Alternate Level Stairs-Number of Steps 10  spiral   Alternate Level Stairs-Rails Right   Home Equipment Cane - single point;Walker - 2 wheels;Walker - standard;Bedside commode;Shower seat;Grab bars - tub/shower;Hand held shower head   Prior Function   Level of Independence Independent;Independent with household mobility without device;Independent with community mobility without device;Independent with gait;Independent with transfers   Vocation Retired   Leisure yard work   Observation/Other Assessments   Activities of Balance Confidence  Scale (ABC Scale)  37.5   Functional Activities Questionnaire (FAQ)  40.3   Fear Avoidance Belief Questionnaire (FABQ)  48   ROM / Strength   AROM / PROM / Strength Strength   Strength   Overall Strength Deficits   Overall Strength Comments BUEs & bil. hips & knees 5/5   Strength Assessment Site Ankle   Right/Left Ankle Right;Left   Right Ankle Dorsiflexion 3/5   Right Ankle Plantar Flexion 2+/5   Left Ankle Dorsiflexion 3/5   Left Ankle Plantar Flexion 3-/5   Flexibility   Soft Tissue Assessment /Muscle Length yes   Hamstrings bil. tightness noted   Transfers   Transfers Sit to Stand;Stand to Sit;Stand Pivot Transfers   Sit to Stand 6: Modified independent (Device/Increase time);With upper extremity assist;With armrests;From chair/3-in-1   Stand to Sit 6: Modified independent (Device/Increase time);With upper extremity assist;With armrests;To chair/3-in-1   Stand Pivot Transfers 5: Supervision;With armrests   Ambulation/Gait   Ambulation/Gait Yes   Ambulation/Gait Assistance 4: Min assist;5: Supervision  MinA for all scanning, negotiating obstacles, etc.   Ambulation/Gait Assistance Details balance losses with all visual components of assessment   Ambulation Distance (Feet) 200 Feet   Assistive device None   Gait Pattern Step-through pattern;Decreased arm swing - right;Decreased arm swing - left;Decreased stride length;Decreased dorsiflexion - right;Decreased dorsiflexion - left;Decreased trunk rotation;Trunk flexed;Wide base of support;Poor foot clearance - left;Poor foot clearance - right   Ambulation Surface Indoor;Level   Gait velocity 2.41 ft/sec    Gait velocity - backwards slowed with decreased step length & clearance   Stairs Yes   Stairs Assistance 5: Supervision   Stair Management Technique One rail Right;Step to pattern;Forwards   Number of Stairs 4   Ramp 4: Min assist  no device   Curb 4: Min assist  no device   Standardized Balance Assessment   Standardized  Balance Assessment Berg Balance Test;Timed Up and Go Test   Berg Balance Test   Sit to Stand Able to stand  independently using hands   Standing Unsupported Able to stand safely 2 minutes   Sitting with Back Unsupported but Feet Supported on Floor or Stool Able to sit safely and securely 2 minutes   Stand to Sit Controls descent by using hands   Transfers Able to transfer safely, definite need of hands   Standing Unsupported with Eyes Closed Needs help to keep from falling   Standing Ubsupported with Feet Together Able to place feet together independently and stand for 1 minute with supervision   From Standing, Reach Forward with Outstretched Arm Can reach forward >5 cm safely (2")   From Standing Position, Pick up Object from Floor Able to pick up shoe, needs supervision   From Standing Position, Turn to Look Behind Over each Shoulder Turn sideways only but maintains balance   Turn 360 Degrees Needs close supervision or  verbal cueing   Standing Unsupported, Alternately Place Feet on Step/Stool Able to complete >2 steps/needs minimal assist   Standing Unsupported, One Foot in [employer] balance while stepping or standing   Standing on One Leg Tries to lift leg/unable to hold 3 seconds but remains standing independently   Total Score 30   Timed Up and Go Test   Normal TUG (seconds) 17.89  >13.5sec indicates fall risk   Cognitive TUG (seconds) 26.21  46.5% increase; >10% indicates fall risk   Functional Gait  Assessment   Gait assessed  Yes   Gait Level Surface Walks 20 ft, slow speed, abnormal gait pattern, evidence for imbalance or deviates 10-15 in outside of the 12 in walkway width. Requires more than 7 sec to ambulate 20 ft.   Change in Gait Speed Cannot change speeds, deviates greater than 15 in outside 12 in walkway width, or loses balance and has to reach for wall or be caught.   Gait with Horizontal Head Turns Performs head turns with moderate changes in gait velocity, slows down,  deviates 10-15 in outside 12 in walkway width but recovers, can continue to walk.   Gait with Vertical Head Turns Performs task with moderate change in gait velocity, slows down, deviates 10-15 in outside 12 in walkway width but recovers, can continue to walk.   Gait and Pivot Turn Turns slowly, requires verbal cueing, or requires several small steps to catch balance following turn and stop   Step Over Obstacle Is able to step over one shoe box (4.5 in total height) but must slow down and adjust steps to clear box safely. May require verbal cueing.   Gait with Narrow Base of Support Ambulates less than 4 steps heel to toe or cannot perform without assistance.   Gait with Eyes Closed Cannot walk 20 ft without assistance, severe gait deviations or imbalance, deviates greater than 15 in outside 12 in walkway width or will not attempt task.   Ambulating Backwards Walks 20 ft, slow speed, abnormal gait pattern, evidence for imbalance, deviates 10-15 in outside 12 in walkway width.   Steps Two feet to a stair, must use rail.   Total Score 7                             PT Short Term Goals - 10/03/14 1031    PT SHORT TERM GOAL #1   Title Patient verbalizes understanding of fall prevention strategies including assistive device recommendations. (Target Date: 11/02/2014)   Time 1   Period Months   Status New   PT SHORT TERM GOAL #2   Title Patient demonstrates understanding of initial HEP.  (Target Date: 11/02/2014)   Time 1   Period Months   Status New   PT SHORT TERM GOAL #3   Title Patient ambulates 200' scanning environment with supervision / cues on technique for balance.  (Target Date: 11/02/2014)   Time 1   Period Months   Status New           PT Long Term Goals - 10/03/14 1034    PT LONG TERM GOAL #1   Title Patient demonstrates / verbalizes understanding of ongoing HEP / fitness program.  (Target Date: 12/02/2014)   Time 2   Period Months   Status New   PT  LONG TERM GOAL #2   Title Berg Balance >/= 38/56  (Target Date: 11/02/2014)   Time 2   Period  Months   Status New   PT LONG TERM GOAL #3   Title TImed Up & Go <13.5 seconds  (Target Date: 11/02/2014)   Time 2   Period Months   Status New   PT LONG TERM GOAL #4   Title Functional Gait Assessment >12/30  (Target Date: 11/02/2014)   Time 2   Status New   PT LONG TERM GOAL #5   Title Patient ambulates 300' with LRAD safely modified independent.  (Target Date: 11/02/2014)   Time 2   Period Months   Status New               Plan - October 24, 2014 1026    Clinical Impression Statement This 81yo male presented to PT with complaint of increased unsteadiness. Test performed during PT all indicate high fall risk: Timed Up & Go 17.89sec with cognitive component increasing 46.5%, Berg Balance 30/56, Functional Gait Assessment 7/30. Patient would benefit from skilled PT to address deficits noted.   Pt will benefit from skilled therapeutic intervention in order to improve on the following deficits Abnormal gait;Decreased activity tolerance;Decreased balance;Decreased knowledge of use of DME;Decreased mobility;Decreased strength;Impaired flexibility;Postural dysfunction   Rehab Potential Good   PT Frequency 2x / week   PT Duration Other (comment)  9 weeks (60 days)   PT Treatment/Interventions ADLs/Self Care Home Management;DME Instruction;Gait training;Stair training;Functional mobility training;Therapeutic activities;Therapeutic exercise;Balance training;Neuromuscular re-education;Patient/family education   PT Next Visit Plan assess visual input with balance (pursuits & saccades) in standing; Fall prevention strategies   PT Home Exercise Plan Balance in corner with head turns   Recommended Other Services Initiate Silver Sneakers   Consulted and Agree with Plan of Care Patient          G-Codes - 10/24/2014 1039    Functional Assessment Tool Used TImed Up & Go 17.89sec; cognitive TUG 26.21sec  (46.5% increase)   Functional Limitation Mobility: Walking and moving around   Mobility: Walking and Moving Around Current Status [id number]) At least 40 percent but less than 60 percent impaired, limited or restricted   Mobility: Walking and Moving Around Goal Status [phone]) At least 20 percent but less than 40 percent impaired, limited or restricted       Problem List Patient Active Problem List   Diagnosis Date Noted  . At high risk for falls 05/05/2014  . Encounter for therapeutic drug monitoring 03/15/2013  . Chest pain 02/03/2013  . Obesity (BMI 30-39.9) 09/21/2012  . Poorly controlled type 2 diabetes mellitus with neuropathy 07/03/2012  . Chronic kidney disease, stage III (moderate) 07/03/2012  . Restless leg syndrome 12/04/2011  . Long term current use of anticoagulant therapy 07/15/2011  . Paroxysmal atrial fibrillation 06/21/2011  . Upper extremity weakness 06/21/2011  . Midsternal chest pain 06/19/2011  . Coronary artery disease   . Hypertension   . Hyperlipidemia   . COPD (chronic obstructive pulmonary disease)   . GERD (gastroesophageal reflux disease)   . Peripheral neuropathy   . Dyslipidemia 03/27/2011  . Diabetic peripheral neuropathy 12/17/2010  . Hypotestosteronism 05/07/2010  . LEG CRAMPS, IDIOPATHIC 08/14/2009  . Insomnia 08/14/2009  . BASAL CELL CARCINOMA, NOSE 08/14/2009  . OBESITY 08/02/2009  . EDEMA 04/18/2009  . CAROTID ARTERY DISEASE 01/04/2009  . PERITONSILLAR ABSCESS 07/21/2008  . HYPERTENSION, HEART CONTROLLED W/O ASSOC CHF 03/28/2008  . CAD, NATIVE VESSEL 03/28/2008  . G E R D 01/12/2007  . Morbid obesity 09/15/2006  . HYPERLIPIDEMIA 07/01/2006  . HYPERTENSION 07/01/2006  . CORONARY ARTERY DISEASE 07/01/2006  .  COPD 07/01/2006  . OSTEOARTHRITIS 07/01/2006    WALDRON,ROBIN PT, DPT 10/03/2014, 10:42 AM  [facility] [address] [place], [place], [address] Phone: [phone]    Fax:  [phone]

## 2014-10-06 ENCOUNTER — Ambulatory Visit: Payer: Medicare Other | Admitting: Family Medicine

## 2014-10-06 ENCOUNTER — Ambulatory Visit: Payer: Medicare Other

## 2014-10-10 ENCOUNTER — Encounter: Payer: Self-pay | Admitting: Physical Therapy

## 2014-10-10 ENCOUNTER — Telehealth: Payer: Self-pay | Admitting: Family Medicine

## 2014-10-10 ENCOUNTER — Ambulatory Visit: Payer: Medicare Other | Attending: Family Medicine | Admitting: Physical Therapy

## 2014-10-10 DIAGNOSIS — M623 Immobility syndrome (paraplegic): Secondary | ICD-10-CM | POA: Diagnosis present

## 2014-10-10 DIAGNOSIS — R269 Unspecified abnormalities of gait and mobility: Secondary | ICD-10-CM | POA: Diagnosis present

## 2014-10-10 DIAGNOSIS — R2689 Other abnormalities of gait and mobility: Secondary | ICD-10-CM

## 2014-10-10 DIAGNOSIS — R6889 Other general symptoms and signs: Secondary | ICD-10-CM

## 2014-10-10 DIAGNOSIS — M259 Joint disorder, unspecified: Secondary | ICD-10-CM | POA: Diagnosis present

## 2014-10-10 DIAGNOSIS — Z7409 Other reduced mobility: Secondary | ICD-10-CM

## 2014-10-10 DIAGNOSIS — R29818 Other symptoms and signs involving the nervous system: Secondary | ICD-10-CM | POA: Insufficient documentation

## 2014-10-10 DIAGNOSIS — R2681 Unsteadiness on feet: Secondary | ICD-10-CM | POA: Insufficient documentation

## 2014-10-10 DIAGNOSIS — R29898 Other symptoms and signs involving the musculoskeletal system: Secondary | ICD-10-CM

## 2014-10-10 DIAGNOSIS — M2560 Stiffness of unspecified joint, not elsewhere classified: Secondary | ICD-10-CM

## 2014-10-10 NOTE — Therapy (Signed)
[facility] [address] [place], [place], [address] Phone: [phone]   Fax:  [phone]  Physical Therapy Treatment  Patient Details  Name: Gary Yates. MRN: [id number] Date of Birth: 07/20/1933 Referring Provider:  [name], MD  Encounter Date: 10/10/2014      PT End of Session - 10/10/14 0930    Visit Number 2   Number of Visits 18   Date for PT Re-Evaluation 12/02/14   Authorization Type G-Code & progress report every 10th visit   PT Start Time 0930   PT Stop Time 1015   PT Time Calculation (min) 45 min   Equipment Utilized During Treatment Gait belt   Activity Tolerance Patient tolerated treatment well   Behavior During Therapy [facility] for tasks assessed/performed      Past Medical History  Diagnosis Date  . Coronary artery disease     a. h/o Overlapping stents RCA;  b. 06/2011 Cath: patent stents, nonobs dzs, NL EF.  Marland Kitchen Hypertension   . Osteoarthritis     shoulder  . Restless leg   . Hyperlipidemia   . COPD (chronic obstructive pulmonary disease) ([place])   . Nephrolithiasis   . Dyspnea   . GERD (gastroesophageal reflux disease)   . Low testosterone   . SVT (supraventricular tachycardia) ([place])   . Atrial fibrillation ([place])   . DM (diabetes mellitus) ([place])     Type 2, peripheral neuropathy.  . Diabetic peripheral neuropathy [facility])     Past Surgical History  Procedure Laterality Date  . Cholecystectomy    . Cardiac catheterization  01/2013  . Coronary angioplasty  2004  . Back surgery  [place]  . Rotator cuff repair    . Left heart catheterization with coronary angiogram N/A 06/18/2011    Procedure: LEFT HEART CATHETERIZATION WITH CORONARY ANGIOGRAM;  Surgeon: Peter M Martinique, MD;  Location: [facility] CATH LAB;  Service: Cardiovascular;  Laterality: N/A;  . Left heart catheterization with coronary angiogram N/A 01/27/2013    Procedure: LEFT HEART CATHETERIZATION WITH CORONARY ANGIOGRAM;   Surgeon: Burnell Blanks, MD;  Location[facility] CATH LAB;  Service: Cardiovascular;  Laterality: N/A;    There were no vitals filed for this visit.  Visit Diagnosis:  Abnormality of gait  Unsteadiness  Ankle weakness  Balance problems  Decreased functional activity tolerance  Stiffness due to immobility      Subjective Assessment - 10/10/14 0929    Subjective (p) No falls. He reports gets off balance when leans forward to pick up objects.   Currently in Pain? (p) No/denies                Vestibular Assessment - 10/10/14 0930    Occulomotor Exam   Occulomotor Alignment Normal   Spontaneous Absent   Gaze-induced Absent   Head shaking Horizontal Absent   Head Shaking Vertical Absent   Smooth Pursuits Intact   Saccades Intact   Positional Testing   Sidelying Test Sidelying Right;Sidelying Left   Sidelying Right   Sidelying Right Duration No dizziness   Sidelying Right Symptoms No nystagmus   Sidelying Left   Sidelying Left Duration no dizziness   Sidelying Left Symptoms No nystagmus      PT demo, instructed in proper sit to /from stand technique. Patient performed 5 reps from chair with armrests with tactile & verbal cues.                  PT Education - 10/10/14  0930    Education provided Yes   Education Details hamstring & heelcord stretches, diabetic shoes & nail care issues, sit to / from stand technique   Person(s) Educated Patient   Methods Explanation;Demonstration;Verbal cues   Comprehension Verbalized understanding;Returned demonstration;Verbal cues required;Need further instruction          PT Short Term Goals - 10/03/14 1031    PT SHORT TERM GOAL #1   Title Patient verbalizes understanding of fall prevention strategies including assistive device recommendations. (Target Date: 11/02/2014)   Time 1   Period Months   Status New   PT SHORT TERM GOAL #2   Title Patient demonstrates understanding of initial HEP.  (Target Date:  11/02/2014)   Time 1   Period Months   Status New   PT SHORT TERM GOAL #3   Title Patient ambulates 200' scanning environment with supervision / cues on technique for balance.  (Target Date: 11/02/2014)   Time 1   Period Months   Status New           PT Long Term Goals - 10/03/14 1034    PT LONG TERM GOAL #1   Title Patient demonstrates / verbalizes understanding of ongoing HEP / fitness program.  (Target Date: 12/02/2014)   Time 2   Period Months   Status New   PT LONG TERM GOAL #2   Title Berg Balance >/= 38/56  (Target Date: 11/02/2014)   Time 2   Period Months   Status New   PT LONG TERM GOAL #3   Title TImed Up & Go <13.5 seconds  (Target Date: 11/02/2014)   Time 2   Period Months   Status New   PT LONG TERM GOAL #4   Title Functional Gait Assessment >12/30  (Target Date: 11/02/2014)   Time 2   Status New   PT LONG TERM GOAL #5   Title Patient ambulates 300' with LRAD safely modified independent.  (Target Date: 11/02/2014)   Time 2   Period Months   Status New               Plan - 10/10/14 0930    Clinical Impression Statement Patient appears to understand hamstring & heelcord stretches. No unusual eye movement noted with testing with no nystagmus or dizziness.    Pt will benefit from skilled therapeutic intervention in order to improve on the following deficits Abnormal gait;Decreased activity tolerance;Decreased balance;Decreased knowledge of use of DME;Decreased mobility;Decreased strength;Impaired flexibility;Postural dysfunction   Rehab Potential Good   PT Frequency 2x / week   PT Duration Other (comment)  9 weeks (60 days)   PT Treatment/Interventions ADLs/Self Care Home Management;DME Instruction;Gait training;Stair training;Functional mobility training;Therapeutic activities;Therapeutic exercise;Balance training;Neuromuscular re-education;Patient/family education   PT Next Visit Plan HEP noted below, Fall prevention strategies,    PT Home  Exercise Plan Balance in corner with head turns & wall bumps   Consulted and Agree with Plan of Care Patient        Problem List Patient Active Problem List   Diagnosis Date Noted  . At high risk for falls 05/05/2014  . Encounter for therapeutic drug monitoring 03/15/2013  . Chest pain 02/03/2013  . Obesity (BMI 30-39.9) 09/21/2012  . Poorly controlled type 2 diabetes mellitus with neuropathy ([place]) 07/03/2012  . Chronic kidney disease, stage III (moderate) 07/03/2012  . Restless leg syndrome 12/04/2011  . Long term current use of anticoagulant therapy 07/15/2011  . Paroxysmal atrial fibrillation ([place]) 06/21/2011  . Upper extremity weakness 06/21/2011  .  Midsternal chest pain 06/19/2011  . Coronary artery disease   . Hypertension   . Hyperlipidemia   . COPD (chronic obstructive pulmonary disease) ([place])   . GERD (gastroesophageal reflux disease)   . Peripheral neuropathy ([place])   . Dyslipidemia 03/27/2011  . Diabetic peripheral neuropathy ([place]) 12/17/2010  . Hypotestosteronism 05/07/2010  . LEG CRAMPS, IDIOPATHIC 08/14/2009  . Insomnia 08/14/2009  . BASAL CELL CARCINOMA, NOSE 08/14/2009  . OBESITY 08/02/2009  . EDEMA 04/18/2009  . CAROTID ARTERY DISEASE 01/04/2009  . PERITONSILLAR ABSCESS 07/21/2008  . HYPERTENSION, HEART CONTROLLED W/O ASSOC CHF 03/28/2008  . CAD, NATIVE VESSEL 03/28/2008  . G E R D 01/12/2007  . Morbid obesity ([place]) 09/15/2006  . HYPERLIPIDEMIA 07/01/2006  . HYPERTENSION 07/01/2006  . CORONARY ARTERY DISEASE 07/01/2006  . COPD 07/01/2006  . OSTEOARTHRITIS 07/01/2006    Rosaria Kubin PT, DPT 10/10/2014, 11:40 AM  [facility] [address] [place], [place], [address] Phone: [phone]   Fax:  [phone]

## 2014-10-10 NOTE — Telephone Encounter (Signed)
Error

## 2014-10-10 NOTE — Patient Instructions (Signed)
Hamstring Stretch (Sitting)    Sitting, extend one leg and place hands on same thigh for support. Keeping torso straight, lean forward, sliding hands down leg, until a stretch is felt in back of thigh. Hold __20__ seconds. Repeat with other leg. Repeat 2 times on each leg  Copyright  VHI. All rights reserved.  Gastroc, Sitting (Passive)    Sit with strap or towel around ball of foot. Gently pull toward body. Hold __20_ seconds.  Repeat _2__ times per leg. Do _3-5__ sessions per day.   Combine above 2 exercises by pulling foot up towards you until you feel a stretch. Then lean trunk forward. Hold 20 seconds. Repeat 2 times on each leg.     Copyright  VHI. All rights reserved.  Sit to Stand / Stand to Sit / Transfers    Sit on edge of a solid chair with arms, feet flat on floor. Lean forward over feet and stand up with hands on knees. Shift weight forward over feet first then straighten back.  Sit down slowly. Lean forward to lower weight or pelvis first then sit back.  Repeat __10__ times per session. Do _2-4__ sessions per day.  Copyright  VHI. All rights reserved.

## 2014-10-12 ENCOUNTER — Encounter: Payer: Self-pay | Admitting: Physical Therapy

## 2014-10-12 ENCOUNTER — Ambulatory Visit: Payer: Medicare Other | Admitting: Physical Therapy

## 2014-10-12 DIAGNOSIS — M2560 Stiffness of unspecified joint, not elsewhere classified: Secondary | ICD-10-CM

## 2014-10-12 DIAGNOSIS — Z7409 Other reduced mobility: Secondary | ICD-10-CM

## 2014-10-12 DIAGNOSIS — R29898 Other symptoms and signs involving the musculoskeletal system: Secondary | ICD-10-CM

## 2014-10-12 DIAGNOSIS — R269 Unspecified abnormalities of gait and mobility: Secondary | ICD-10-CM | POA: Diagnosis not present

## 2014-10-12 DIAGNOSIS — R2689 Other abnormalities of gait and mobility: Secondary | ICD-10-CM

## 2014-10-12 DIAGNOSIS — R6889 Other general symptoms and signs: Secondary | ICD-10-CM

## 2014-10-12 DIAGNOSIS — R2681 Unsteadiness on feet: Secondary | ICD-10-CM

## 2014-10-12 NOTE — Therapy (Signed)
[facility] [address] [place], [place], [address] Phone: [phone]   Fax:  [phone]  Physical Therapy Treatment  Patient Details  Name: Gary Yates. MRN: [id number] Date of Birth: April 17, 1933 Referring Provider:  [name], MD  Encounter Date: 10/12/2014      PT End of Session - 10/12/14 1023    Visit Number 3   Number of Visits 18   Date for PT Re-Evaluation 12/02/14   Authorization Type G-Code & progress report every 10th visit   PT Start Time 1017   PT Stop Time 1100   PT Time Calculation (min) 43 min   Equipment Utilized During Treatment Gait belt   Activity Tolerance Patient tolerated treatment well   Behavior During Therapy [facility] for tasks assessed/performed      Past Medical History  Diagnosis Date  . Coronary artery disease     a. h/o Overlapping stents RCA;  b. 06/2011 Cath: patent stents, nonobs dzs, NL EF.  Marland Kitchen Hypertension   . Osteoarthritis     shoulder  . Restless leg   . Hyperlipidemia   . COPD (chronic obstructive pulmonary disease) ([place])   . Nephrolithiasis   . Dyspnea   . GERD (gastroesophageal reflux disease)   . Low testosterone   . SVT (supraventricular tachycardia) ([place])   . Atrial fibrillation ([place])   . DM (diabetes mellitus) ([place])     Type 2, peripheral neuropathy.  . Diabetic peripheral neuropathy [facility])     Past Surgical History  Procedure Laterality Date  . Cholecystectomy    . Cardiac catheterization  01/2013  . Coronary angioplasty  2004  . Back surgery  [place]  . Rotator cuff repair    . Left heart catheterization with coronary angiogram N/A 06/18/2011    Procedure: LEFT HEART CATHETERIZATION WITH CORONARY ANGIOGRAM;  Surgeon: Peter M Martinique, MD;  Location: [facility] CATH LAB;  Service: Cardiovascular;  Laterality: N/A;  . Left heart catheterization with coronary angiogram N/A 01/27/2013    Procedure: LEFT HEART CATHETERIZATION WITH CORONARY ANGIOGRAM;   Surgeon: Burnell Blanks, MD;  Location: [facility] CATH LAB;  Service: Cardiovascular;  Laterality: N/A;    There were no vitals filed for this visit.  Visit Diagnosis:  Abnormality of gait  Unsteadiness  Ankle weakness  Balance problems  Decreased functional activity tolerance  Stiffness due to immobility      Subjective Assessment - 10/12/14 1022    Subjective Pt with complaints of dizziness that is worse in the mornings and with bending down to pick up items.    Currently in Pain? No/denies           [facility] Adult PT Treatment/Exercise - 10/12/14 1148    Static Standing Balance   Static Standing - Balance Support No upper extremity supported;During functional activity   Static Standing - Level of Assistance 4: Min assist;5: Stand by assistance   Static Standing - Comment/# of Minutes in corner on floor: with feet together- eyes closed no head movements, eyes open with head nods/shakes. with feet apart- eyes closed no head movements, eyes closed head nods/shakes                                                   Vestibular Treatment/Exercise - 10/12/14 1145    Vestibular Treatment/Exercise   Habituation Exercises  [place]   Number of Reps  4   Symptom Description  positive dizziness/blurred vision with lying toward right side, none toward the left. Also with dizziness/blurred vision with sitting up from both sides. Symptoms lasting for ~1 minute before subsidding. No nystagmus noted.                                       PT Education - 10/12/14 1056    Education provided Yes   Education Details HEP:  Nestor Lewandowsky; corner balance exercises   Person(s) Educated Patient;Spouse   Methods Explanation;Demonstration;Handout;Verbal cues   Comprehension Verbalized understanding;Returned demonstration          PT Short Term Goals - 10/03/14 1031    PT SHORT TERM GOAL #1   Title Patient verbalizes understanding of fall prevention strategies  including assistive device recommendations. (Target Date: 11/02/2014)   Time 1   Period Months   Status New   PT SHORT TERM GOAL #2   Title Patient demonstrates understanding of initial HEP.  (Target Date: 11/02/2014)   Time 1   Period Months   Status New   PT SHORT TERM GOAL #3   Title Patient ambulates 200' scanning environment with supervision / cues on technique for balance.  (Target Date: 11/02/2014)   Time 1   Period Months   Status New           PT Long Term Goals - 10/03/14 1034    PT LONG TERM GOAL #1   Title Patient demonstrates / verbalizes understanding of ongoing HEP / fitness program.  (Target Date: 12/02/2014)   Time 2   Period Months   Status New   PT LONG TERM GOAL #2   Title [place] Balance >/= 38/56  (Target Date: 11/02/2014)   Time 2   Period Months   Status New   PT LONG TERM GOAL #3   Title TImed Up & Go <13.5 seconds  (Target Date: 11/02/2014)   Time 2   Period Months   Status New   PT LONG TERM GOAL #4   Title Functional Gait Assessment >12/30  (Target Date: 11/02/2014)   Time 2   Status New   PT LONG TERM GOAL #5   Title Patient ambulates 300' with LRAD safely modified independent.  (Target Date: 11/02/2014)   Time 2   Period Months   Status New           Plan - 10/12/14 1023    Clinical Impression Statement Pt with reports of dizziness with lying toward right side, not left, with brandtt daroff today. No nystagmus noted. Also added corner balance exercies for home with mild dizziness reported. Pt making steady progress toward goals   Pt will benefit from skilled therapeutic intervention in order to improve on the following deficits Abnormal gait;Decreased activity tolerance;Decreased balance;Decreased knowledge of use of DME;Decreased mobility;Decreased strength;Impaired flexibility;Postural dysfunction   Rehab Potential Good   PT Frequency 2x / week   PT Duration Other (comment)  9 weeks (60 days)   PT Treatment/Interventions ADLs/Self  Care Home Management;DME Instruction;Gait training;Stair training;Functional mobility training;Therapeutic activities;Therapeutic exercise;Balance training;Neuromuscular re-education;Patient/family education   PT Next Visit Plan fall prevention strategies, continue to work on balance and habituation for dizziness as needed   PT Home Exercise Plan on 10/5: brandtt daroff and corner balance with feet on floor both together and apart.  Consulted and Agree with Plan of Care Patient        Problem List Patient Active Problem List   Diagnosis Date Noted  . At high risk for falls 05/05/2014  . Encounter for therapeutic drug monitoring 03/15/2013  . Chest pain 02/03/2013  . Obesity (BMI 30-39.9) 09/21/2012  . Poorly controlled type 2 diabetes mellitus with neuropathy ([place]) 07/03/2012  . Chronic kidney disease, stage III (moderate) 07/03/2012  . Restless leg syndrome 12/04/2011  . Long term current use of anticoagulant therapy 07/15/2011  . Paroxysmal atrial fibrillation ([place]) 06/21/2011  . Upper extremity weakness 06/21/2011  . Midsternal chest pain 06/19/2011  . Coronary artery disease   . Hypertension   . Hyperlipidemia   . COPD (chronic obstructive pulmonary disease) ([place])   . GERD (gastroesophageal reflux disease)   . Peripheral neuropathy ([place])   . Dyslipidemia 03/27/2011  . Diabetic peripheral neuropathy ([place]) 12/17/2010  . Hypotestosteronism 05/07/2010  . LEG CRAMPS, IDIOPATHIC 08/14/2009  . Insomnia 08/14/2009  . BASAL CELL CARCINOMA, NOSE 08/14/2009  . OBESITY 08/02/2009  . EDEMA 04/18/2009  . CAROTID ARTERY DISEASE 01/04/2009  . PERITONSILLAR ABSCESS 07/21/2008  . HYPERTENSION, HEART CONTROLLED W/O ASSOC CHF 03/28/2008  . CAD, NATIVE VESSEL 03/28/2008  . G E R D 01/12/2007  . Morbid obesity ([place]) 09/15/2006  . HYPERLIPIDEMIA 07/01/2006  . HYPERTENSION 07/01/2006  . CORONARY ARTERY DISEASE 07/01/2006  . COPD 07/01/2006  . OSTEOARTHRITIS 07/01/2006    Willow Ora 10/12/2014, 11:50 AM  Willow Ora, PTA, [facility] [address], [place], [place] [address] [phone] 10/12/2014, 11:50 AM

## 2014-10-12 NOTE — Patient Instructions (Signed)
   Nestor Lewandowsky Exercise  Sit on the side of a bed. Turn your head 45 degrees to the LEFT. Hold this rotated neck position and then lie down on your RIGHT side. Stay on your side for until symptoms go away, +30 seconds. Then return to the seated position again while maintaining your neck rotated in the same position.   Once seated again, turn your head 45 degrees to the RIGHT. Hold this rotated neck position and then lie down on your LEFT side. Stay on your side until symptoms go away, + 30 seconds.  Then return to the seated position again.   In corner with chair in front of you:  with your feet together - close eyes. hold 8-10 seconds x 3 reps. - eyes open, move your head side<>side x 10 each way up<>down x 10 each way  with feet apart: - eyes closed. hold 10 seconds x 3 reps. - eyes closed, move your head left<>right x 10 reps up<>down x 10 reps

## 2014-10-13 ENCOUNTER — Ambulatory Visit: Payer: Medicare Other

## 2014-10-13 ENCOUNTER — Ambulatory Visit: Payer: Medicare Other | Admitting: Family Medicine

## 2014-10-20 ENCOUNTER — Ambulatory Visit: Payer: Medicare Other

## 2014-10-21 ENCOUNTER — Ambulatory Visit: Payer: Medicare Other | Admitting: Physical Therapy

## 2014-10-24 ENCOUNTER — Other Ambulatory Visit: Payer: Self-pay | Admitting: Family Medicine

## 2014-10-24 ENCOUNTER — Ambulatory Visit: Payer: Medicare Other | Admitting: Family Medicine

## 2014-10-24 ENCOUNTER — Ambulatory Visit: Payer: Medicare Other

## 2014-10-24 ENCOUNTER — Ambulatory Visit: Payer: Medicare Other | Admitting: Physical Therapy

## 2014-10-27 ENCOUNTER — Encounter: Payer: Medicare Other | Admitting: Physical Therapy

## 2014-10-28 ENCOUNTER — Ambulatory Visit: Payer: Medicare Other | Admitting: Cardiovascular Disease

## 2014-10-31 ENCOUNTER — Encounter: Payer: Medicare Other | Admitting: Physical Therapy

## 2014-11-02 ENCOUNTER — Encounter: Payer: Medicare Other | Admitting: Physical Therapy

## 2014-11-07 ENCOUNTER — Encounter: Payer: Medicare Other | Admitting: Physical Therapy

## 2014-11-09 ENCOUNTER — Ambulatory Visit: Payer: Medicare Other | Admitting: Physical Therapy

## 2014-11-14 ENCOUNTER — Encounter: Payer: Medicare Other | Admitting: Physical Therapy

## 2014-11-15 ENCOUNTER — Encounter (HOSPITAL_COMMUNITY): Payer: Medicare Other

## 2014-11-16 ENCOUNTER — Encounter: Payer: Medicare Other | Admitting: Physical Therapy

## 2014-11-21 ENCOUNTER — Ambulatory Visit: Payer: Medicare Other | Admitting: Physical Therapy

## 2014-11-21 ENCOUNTER — Other Ambulatory Visit: Payer: Self-pay | Admitting: Family Medicine

## 2014-11-21 NOTE — Telephone Encounter (Signed)
Okay to refill Clonazepam? Pt last seen 10/2014.

## 2014-11-21 NOTE — Telephone Encounter (Signed)
Refill for 6 months. 

## 2014-11-22 ENCOUNTER — Other Ambulatory Visit: Payer: Self-pay | Admitting: Cardiovascular Disease

## 2014-11-22 ENCOUNTER — Other Ambulatory Visit: Payer: Self-pay | Admitting: Family Medicine

## 2014-11-22 DIAGNOSIS — I6523 Occlusion and stenosis of bilateral carotid arteries: Secondary | ICD-10-CM

## 2014-11-23 ENCOUNTER — Encounter: Payer: Medicare Other | Admitting: Physical Therapy

## 2014-11-24 ENCOUNTER — Ambulatory Visit: Payer: Medicare Other | Admitting: Family Medicine

## 2014-11-24 ENCOUNTER — Ambulatory Visit: Payer: Medicare Other

## 2014-11-28 ENCOUNTER — Ambulatory Visit: Payer: Medicare Other | Attending: Family Medicine | Admitting: Physical Therapy

## 2014-11-29 ENCOUNTER — Ambulatory Visit: Payer: Medicare Other | Admitting: Physical Therapy

## 2014-11-30 ENCOUNTER — Ambulatory Visit (HOSPITAL_COMMUNITY)
Admission: RE | Admit: 2014-11-30 | Discharge: 2014-11-30 | Disposition: A | Discharge status: Home / Self Care | Payer: Medicare Other | Source: Ambulatory Visit | Attending: Cardiovascular Disease | Admitting: Cardiovascular Disease

## 2014-11-30 DIAGNOSIS — I6523 Occlusion and stenosis of bilateral carotid arteries: Secondary | ICD-10-CM | POA: Diagnosis present

## 2014-11-30 DIAGNOSIS — I10 Essential (primary) hypertension: Secondary | ICD-10-CM | POA: Insufficient documentation

## 2014-11-30 DIAGNOSIS — E1140 Type 2 diabetes mellitus with diabetic neuropathy, unspecified: Secondary | ICD-10-CM | POA: Diagnosis not present

## 2014-11-30 DIAGNOSIS — E785 Hyperlipidemia, unspecified: Secondary | ICD-10-CM | POA: Diagnosis not present

## 2014-12-01 ENCOUNTER — Other Ambulatory Visit: Payer: Self-pay | Admitting: Family Medicine

## 2014-12-05 ENCOUNTER — Ambulatory Visit (INDEPENDENT_AMBULATORY_CARE_PROVIDER_SITE_OTHER): Payer: Medicare Other | Admitting: Family Medicine

## 2014-12-05 ENCOUNTER — Ambulatory Visit (INDEPENDENT_AMBULATORY_CARE_PROVIDER_SITE_OTHER): Payer: Medicare Other | Admitting: General Practice

## 2014-12-05 VITALS — BP 120/78 | HR 80 | Temp 98.6°F | Resp 14 | Ht 70.0 in | Wt 257.0 lb

## 2014-12-05 DIAGNOSIS — Z5181 Encounter for therapeutic drug level monitoring: Secondary | ICD-10-CM

## 2014-12-05 DIAGNOSIS — E1140 Type 2 diabetes mellitus with diabetic neuropathy, unspecified: Secondary | ICD-10-CM | POA: Diagnosis not present

## 2014-12-05 DIAGNOSIS — N183 Chronic kidney disease, stage 3 unspecified: Secondary | ICD-10-CM

## 2014-12-05 DIAGNOSIS — E1165 Type 2 diabetes mellitus with hyperglycemia: Secondary | ICD-10-CM | POA: Diagnosis not present

## 2014-12-05 DIAGNOSIS — I10 Essential (primary) hypertension: Secondary | ICD-10-CM | POA: Diagnosis not present

## 2014-12-05 DIAGNOSIS — G2581 Restless legs syndrome: Secondary | ICD-10-CM | POA: Diagnosis not present

## 2014-12-05 DIAGNOSIS — I480 Paroxysmal atrial fibrillation: Secondary | ICD-10-CM | POA: Diagnosis not present

## 2014-12-05 LAB — BASIC METABOLIC PANEL
BUN: 17 mg/dL (ref 6–23)
CALCIUM: 9.8 mg/dL (ref 8.4–10.5)
CHLORIDE: 100 meq/L (ref 96–112)
CO2: 24 meq/L (ref 19–32)
Creatinine, Ser: 1.21 mg/dL (ref 0.40–1.50)
GFR: 61.35 mL/min (ref >60.00)
Glucose, Bld: 310 mg/dL — ABNORMAL HIGH (ref 70–99)
Potassium: 4.3 mEq/L (ref 3.5–5.1)
SODIUM: 136 meq/L (ref 135–145)

## 2014-12-05 LAB — LIPID PANEL
Cholesterol: 180 mg/dL (ref 0–200)
HDL: 27.5 mg/dL — AB (ref >39.00)
Total CHOL/HDL Ratio: 7

## 2014-12-05 LAB — HEPATIC FUNCTION PANEL
ALT: 13 U/L (ref 0–53)
AST: 26 U/L (ref 0–37)
Albumin: 4 g/dL (ref 3.5–5.2)
Alkaline Phosphatase: 79 U/L (ref 39–117)
BILIRUBIN DIRECT: 0.1 mg/dL (ref 0.0–0.3)
BILIRUBIN TOTAL: 0.6 mg/dL (ref 0.2–1.2)
TOTAL PROTEIN: 6.8 g/dL (ref 6.0–8.3)

## 2014-12-05 LAB — POCT INR: INR: 3.8

## 2014-12-05 LAB — LDL CHOLESTEROL, DIRECT: LDL DIRECT: 85 mg/dL

## 2014-12-05 LAB — HEMOGLOBIN A1C: HEMOGLOBIN A1C: 11.8 % — AB (ref 4.6–6.5)

## 2014-12-05 NOTE — Progress Notes (Signed)
Subjective:    Patient ID: Gary Yates., male    DOB: [birth year], 81 y.o.   MRN: [id number]  HPI Follow-up multiple issues:  Type 2 diabetes. History of poor control. He has lost substantial weight past few months which he attributes to taking care of his wife who has significant health problems. Does not check blood sugars regularly. Last A1c 9.3%. Not taking Invokana because of cost issues  Restless leg syndrome. Takes Mirapex requiring 1-1/2 mg at night for control but that is helping. Avoids caffeine.  Hyperlipidemia. Remains on Lipitor and fenofibrate. No myalgias. Due for labs.  Past Medical History  Diagnosis Date  . Coronary artery disease     a. h/o Overlapping stents RCA;  b. 06/2011 Cath: patent stents, nonobs dzs, NL EF.  Marland Kitchen Hypertension   . Osteoarthritis     shoulder  . Restless leg   . Hyperlipidemia   . COPD (chronic obstructive pulmonary disease) ([place])   . Nephrolithiasis   . Dyspnea   . GERD (gastroesophageal reflux disease)   . Low testosterone   . SVT (supraventricular tachycardia) ([place])   . Atrial fibrillation ([place])   . DM (diabetes mellitus) ([place])     Type 2, peripheral neuropathy.  . Diabetic peripheral neuropathy [facility])    Past Surgical History  Procedure Laterality Date  . Cholecystectomy    . Cardiac catheterization  01/2013  . Coronary angioplasty  2004  . Back surgery  [place]  . Rotator cuff repair    . Left heart catheterization with coronary angiogram N/A 06/18/2011    Procedure: LEFT HEART CATHETERIZATION WITH CORONARY ANGIOGRAM;  Surgeon: Peter M Martinique, MD;  Location: [facility] CATH LAB;  Service: Cardiovascular;  Laterality: N/A;  . Left heart catheterization with coronary angiogram N/A 01/27/2013    Procedure: LEFT HEART CATHETERIZATION WITH CORONARY ANGIOGRAM;  Surgeon: Burnell Blanks, MD;  Location[facility] C[facility] LAB;  Service: Cardiovascular;  Laterality: N/A;    reports that he quit smoking about 31 years ago. His smoking use included  Cigarettes. He has a 30 pack-year smoking history. He has never used smokeless tobacco. He reports that he does not drink alcohol or use illicit drugs. family history includes Alzheimer's disease in his mother; Arthritis in his brother and sister; Coronary artery disease in some other family members; Heart disease in his brother, father, mother, and sister; Migraines in his daughter and father; Obesity in his sister and son; Sleep apnea in his son; Thyroid disease in his daughter; Ulcers in his father. Allergies  Allergen Reactions  . Ace Inhibitors Other (See Comments)    cough  . Codeine Nausea Only and Rash       . Penicillins Rash      Review of Systems  Constitutional: Negative for fatigue.  Eyes: Negative for visual disturbance.  Respiratory: Negative for cough, chest tightness and shortness of breath.   Cardiovascular: Negative for chest pain, palpitations and leg swelling.  Endocrine: Negative for polydipsia and polyuria.  Neurological: Negative for dizziness, syncope, weakness, light-headedness and headaches.       Objective:   Physical Exam  Constitutional: He appears well-developed and well-nourished. No distress.  Neck: Neck supple. No JVD present. No thyromegaly present.  Cardiovascular: Normal rate and regular rhythm.   Pulmonary/Chest: Effort normal and breath sounds normal. No respiratory distress. He has no wheezes. He has no rales.  Musculoskeletal:  Trace edema legs bilaterally  Skin:  Impaired monofilament great toe bilaterally. No skin lesions  Assessment & Plan:  #1 type 2 diabetes. History of very poor control poor compliance. Recent weight loss as above which hopefully will help-unless his weight loss is related to poorly controlled diabetes.. Recheck A1c. Continue regular eye exams. Discussed good diabetic foot care #2 hypertension stable and at goal  #3 dyslipidemia. Recheck lipid and hepatic panel #4 restless leg syndrome. Controlled with  Mirapex.

## 2014-12-05 NOTE — Progress Notes (Signed)
Pre visit review using our clinic review tool, if applicable. No additional management support is needed unless otherwise documented below in the visit note. 

## 2014-12-05 NOTE — Patient Instructions (Signed)
Diabetes and Foot Care Diabetes may cause you to have problems because of poor blood supply (circulation) to your feet and legs. This may cause the skin on your feet to become thinner, break easier, and heal more slowly. Your skin may become dry, and the skin may peel and crack. You may also have nerve damage in your legs and feet causing decreased feeling in them. You may not notice minor injuries to your feet that could lead to infections or more serious problems. Taking care of your feet is one of the most important things you can do for yourself.  HOME CARE INSTRUCTIONS  Wear shoes at all times, even in the house. Do not go barefoot. Bare feet are easily injured.  Check your feet daily for blisters, cuts, and redness. If you cannot see the bottom of your feet, use a mirror or ask someone for help.  Wash your feet with warm water (do not use hot water) and mild soap. Then pat your feet and the areas between your toes until they are completely dry. Do not soak your feet as this can dry your skin.  Apply a moisturizing lotion or petroleum jelly (that does not contain alcohol and is unscented) to the skin on your feet and to dry, brittle toenails. Do not apply lotion between your toes.  Trim your toenails straight across. Do not dig under them or around the cuticle. File the edges of your nails with an emery board or nail file.  Do not cut corns or calluses or try to remove them with medicine.  Wear clean socks or stockings every day. Make sure they are not too tight. Do not wear knee-high stockings since they may decrease blood flow to your legs.  Wear shoes that fit properly and have enough cushioning. To break in new shoes, wear them for just a few hours a day. This prevents you from injuring your feet. Always look in your shoes before you put them on to be sure there are no objects inside.  Do not cross your legs. This may decrease the blood flow to your feet.  If you find a minor scrape,  cut, or break in the skin on your feet, keep it and the skin around it clean and dry. These areas may be cleansed with mild soap and water. Do not cleanse the area with peroxide, alcohol, or iodine.  When you remove an adhesive bandage, be sure not to damage the skin around it.  If you have a wound, look at it several times a day to make sure it is healing.  Do not use heating pads or hot water bottles. They may burn your skin. If you have lost feeling in your feet or legs, you may not know it is happening until it is too late.  Make sure your health care provider performs a complete foot exam at least annually or more often if you have foot problems. Report any cuts, sores, or bruises to your health care provider immediately. SEEK MEDICAL CARE IF:   You have an injury that is not healing.  You have cuts or breaks in the skin.  You have an ingrown nail.  You notice redness on your legs or feet.  You feel burning or tingling in your legs or feet.  You have pain or cramps in your legs and feet.  Your legs or feet are numb.  Your feet always feel cold. SEEK IMMEDIATE MEDICAL CARE IF:   There is increasing redness,   swelling, or pain in or around a wound.  There is a red line that goes up your leg.  Pus is coming from a wound.  You develop a fever or as directed by your health care provider.  You notice a bad smell coming from an ulcer or wound.   This information is not intended to replace advice given to you by your health care provider. Make sure you discuss any questions you have with your health care provider.   Document Released: 12/22/1999 Document Revised: 08/26/2012 Document Reviewed: 06/02/2012 Elsevier Interactive Patient Education 2016 [employer]  

## 2014-12-05 NOTE — Progress Notes (Signed)
Agree with Coumadin management 

## 2014-12-07 MED ORDER — METFORMIN HCL 500 MG PO TABS: 500.0000 mg | ORAL_TABLET | Freq: Two times a day (BID) | ORAL | Status: DC

## 2014-12-07 NOTE — Addendum Note (Signed)
Addended by: Elio Forget on: 12/07/2014 12:03 PM   Modules accepted: Orders

## 2014-12-23 ENCOUNTER — Other Ambulatory Visit: Payer: Self-pay | Admitting: Family Medicine

## 2014-12-28 ENCOUNTER — Other Ambulatory Visit: Payer: Self-pay | Admitting: Sports Medicine

## 2014-12-28 DIAGNOSIS — M545 Low back pain: Secondary | ICD-10-CM

## 2014-12-28 DIAGNOSIS — R29898 Other symptoms and signs involving the musculoskeletal system: Secondary | ICD-10-CM

## 2014-12-28 DIAGNOSIS — M5136 Other intervertebral disc degeneration, lumbar region: Secondary | ICD-10-CM

## 2014-12-31 ENCOUNTER — Other Ambulatory Visit: Payer: Self-pay | Admitting: Internal Medicine

## 2015-01-03 ENCOUNTER — Other Ambulatory Visit: Payer: Self-pay | Admitting: General Practice

## 2015-01-03 MED ORDER — WARFARIN SODIUM 5 MG PO TABS
ORAL_TABLET | ORAL | Status: DC
Start: 2015-01-03 — End: 2015-01-12

## 2015-01-05 ENCOUNTER — Ambulatory Visit: Payer: Medicare Other

## 2015-01-07 ENCOUNTER — Ambulatory Visit
Admission: RE | Admit: 2015-01-07 | Discharge: 2015-01-07 | Disposition: A | Discharge status: Home / Self Care | Payer: Medicare Other | Source: Ambulatory Visit | Attending: Sports Medicine | Admitting: Sports Medicine

## 2015-01-07 DIAGNOSIS — M5136 Other intervertebral disc degeneration, lumbar region: Secondary | ICD-10-CM

## 2015-01-07 DIAGNOSIS — R29898 Other symptoms and signs involving the musculoskeletal system: Secondary | ICD-10-CM

## 2015-01-07 DIAGNOSIS — M545 Low back pain: Secondary | ICD-10-CM

## 2015-01-11 NOTE — Progress Notes (Signed)
=   Chief Complaint  Patient presents with  . Follow-up     History of Present Illness: 82 yo WM with history of HTN, hyperlipidemia, morbid obesity, DM, CAD, atrial fibrillation, carotid artery disease and obstructive lung disease here for follow up. He was admitted to [facility] 06/18/11 with chest pain. Cardiac cath on 06/18/11 with moderate LAD disease with patent RCA stents. No flow limiting lesions. Normal LVEF. D Dimer and CE were normal. He was also noted to have tachycardia which was felt to be atrial fibrillation. Dr. Caryl Comes saw him in the hospital and started Xarelto for anticoagulation and increased metoprolol for rate control. Slurred speech and seen by Neuro. Head CT without evidence of CVA. Head MRI with questionable tiny acute infarct posterior left frontal lobe, mild small vessel disease type changes, global atrophy without hydrocephalus. Carotid artery dopplers 11/11/12 with mild (0-39%) bilateral ICA stenosis. He decided not to start Xarelto due to cost. He was started on coumadin and has been followed in the coumadin clinic. He was seen 12/23/12 and had c/o chest pressure when working in the yard. Stress myoview 01/13/13 with small inferior wall defect from base to apex with mild peri-infarct ischemia. LVEF=53%. Low risk study. Cath 01/27/13 with stable disease. Admitted to [facility] 02/03/13 with chest pain. Mild elevation troponin. Started on Imdur and Plavix.   He is here today for follow up. He has rare chest pains that last for several seconds. He also has dyspnea with minimal exertion, dizziness and gait instability. This is unchanged. No exertional chest pain. No LE edema. No syncope. Blood sugars are still erratic per pt. He wishes to change to Xarelto.   Primary Care Physician: Carolann Littler  Past Medical History  Diagnosis Date  . Coronary artery disease     a. h/o Overlapping stents RCA;  b. 06/2011 Cath: patent stents, nonobs dzs, NL EF.  Marland Kitchen Hypertension   .  Osteoarthritis     shoulder  . Restless leg   . Hyperlipidemia   . COPD (chronic obstructive pulmonary disease) ([place])   . Nephrolithiasis   . Dyspnea   . GERD (gastroesophageal reflux disease)   . Low testosterone   . SVT (supraventricular tachycardia) ([place])   . Atrial fibrillation ([place])   . DM (diabetes mellitus) ([place])     Type 2, peripheral neuropathy.  . Diabetic peripheral neuropathy [facility])     Past Surgical History  Procedure Laterality Date  . Cholecystectomy    . Cardiac catheterization  01/2013  . Coronary angioplasty  2004  . Back surgery  [place]  . Rotator cuff repair    . Left heart catheterization with coronary angiogram N/A 06/18/2011    Procedure: LEFT HEART CATHETERIZATION WITH CORONARY ANGIOGRAM;  Surgeon: Peter M Martinique, MD;  Location: [facility] CATH LAB;  Service: Cardiovascular;  Laterality: N/A;  . Left heart catheterization with coronary angiogram N/A 01/27/2013    Procedure: LEFT HEART CATHETERIZATION WITH CORONARY ANGIOGRAM;  Surgeon: Burnell Blanks, MD;  Location: [facility] CATH LAB;  Service: Cardiovascular;  Laterality: N/A;    Current Outpatient Prescriptions  Medication Sig Dispense Refill  . ACCU-CHEK AVIVA PLUS test strip TEST 3 TIMES A DAY 100 each 3  . atorvastatin (LIPITOR) 40 MG tablet TAKE 1 TABLET BY MOUTH DAILY 90 tablet 3  . Calcium Carbonate-Vitamin D (CALCIUM + D PO) Take 1 tablet by mouth daily.     . clonazePAM (KLONOPIN) 1 MG tablet TAKE 1 TABLET AT BEDTIME 30 tablet 5  .  clotrimazole (LOTRIMIN) 1 % cream Apply 1 application topically daily as needed. For rash    . fenofibrate micronized (LOFIBRA) 134 MG capsule Take 1 capsule by mouth  daily before breakfast 90 capsule 2  . gabapentin (NEURONTIN) 300 MG capsule Take 2 capsules by mouth 3  times a day 540 capsule 2  . glucose blood (ACCU-CHEK AVIVA PLUS) test strip TEST 3 TIMES A DAY 300 each 3  . Insulin Glargine (TOUJEO SOLOSTAR) 300 UNIT/ML SOPN Inject 50 Units into the skin 2 (two)  times daily. (Patient taking differently: Inject 60 Units into the skin 2 (two) times daily. ) 1.5 mL 5  . insulin lispro (HUMALOG) 100 UNIT/ML KiwkPen Inject 18-20 units with each meal. 15 mL 5  . metFORMIN (GLUCOPHAGE) 500 MG tablet Take 1 tablet (500 mg total) by mouth 2 (two) times daily with a meal. 60 tablet 5  . metoprolol succinate (TOPROL-XL) 100 MG 24 hr tablet TAKE 1 TABLET EVERY DAY IMMEDIATELY FOLLOWING A MEAL 90 tablet 1  . Multiple Vitamin (MULTIVITAMIN) tablet Take 1 tablet by mouth daily.    Marland Kitchen MYRBETRIQ 50 MG TB24 tablet Take 50 mg by mouth daily. By mouth daily    . nitroGLYCERIN (NITROSTAT) 0.4 MG SL tablet Place 1 tablet (0.4 mg total) under the tongue every 5 (five) minutes as needed. Chest pain 25 tablet 6  . oxybutynin (DITROPAN XL) 15 MG 24 hr tablet     . pantoprazole (PROTONIX) 40 MG tablet TAKE 1 TABLET EVERY DAY 90 tablet 2  . pramipexole (MIRAPEX) 1.5 MG tablet MAY TAKE 1 TO 1 AND HALF TABLETS BY MOUTH AT BEDTIME. 60 tablet 5  . tamsulosin (FLOMAX) 0.4 MG CAPS capsule Take 0.4 mg by mouth daily as needed. For fluid    . clopidogrel (PLAVIX) 75 MG tablet Take 1 tablet (75 mg total) by mouth daily. 30 tablet 6  . oxymetazoline (AFRIN) 0.05 % nasal spray Place 1 spray into both nostrils 2 (two) times daily.    . rivaroxaban (XARELTO) 20 MG TABS tablet Take 1 tablet (20 mg total) by mouth daily with supper. 30 tablet 6  . [DISCONTINUED] metoprolol (LOPRESSOR) 50 MG tablet     . [DISCONTINUED] spironolactone (ALDACTONE) 25 MG tablet      No current facility-administered medications for this visit.   Facility-Administered Medications Ordered in Other Visits  Medication Dose Route Frequency Provider Last Rate Last Dose  . testosterone cypionate (DEPOTESTOTERONE CYPIONATE) injection 200 mg  200 mg Intramuscular Q28 days [name], MD   200 mg at 12/26/11 0850  . testosterone cypionate (DEPOTESTOTERONE CYPIONATE) injection 200 mg  200 mg Intramuscular Q28 days [name], MD   200 mg at 01/23/12 0908  . testosterone cypionate (DEPOTESTOTERONE CYPIONATE) injection 200 mg  200 mg Intramuscular Q28 days [name], MD   200 mg at 02/25/12 0848  . testosterone cypionate (DEPOTESTOTERONE CYPIONATE) injection 200 mg  200 mg Intramuscular Q28 days [name], MD   200 mg at 06/03/12 1437    Allergies  Allergen Reactions  . Ace Inhibitors Other (See Comments)    cough  . Codeine Nausea Only and Rash       . Penicillins Rash    Social History   Social History  . Marital Status: Married    Spouse Name: N/A  . Number of Children: 3  . Years of Education: N/A   Occupational History  . Retired    Social History Main Topics  .  Smoking status: Former Smoker -- 1.50 packs/day for 20 years    Types: Cigarettes    Quit date: 04/05/1983  . Smokeless tobacco: Never Used  . Alcohol Use: No  . Drug Use: No  . Sexual Activity: Not Currently   Other Topics Concern  . Not on file   Social History Narrative    Family History  Problem Relation Age of Onset  . Coronary artery disease      Male 1st degree relative <50  . Coronary artery disease      male 1st degree relative <60  . Heart disease Father   . Migraines Father   . Ulcers Father   . Alzheimer's disease Mother   . Heart disease Mother   . Heart disease Sister   . Obesity Sister     Morbid  . Arthritis Sister   . Heart disease Brother   . Arthritis Brother   . Sleep apnea Son   . Obesity Son   . Migraines Daughter   . Thyroid disease Daughter     Review of Systems:  As stated in the HPI and otherwise negative.   BP 126/62 mmHg  Pulse 60  Resp 12  Ht 5\' 10"  (1.778 m)  Wt 251 lb (113.853 kg)  BMI 36.01 kg/m2  Physical Examination: General: Well developed, well nourished, NAD HEENT: OP clear, mucus membranes moist SKIN: warm, dry. No rashes. Neuro: No focal deficits Musculoskeletal: Muscle strength 5/5 all ext Psychiatric: Mood and affect  normal Neck: No JVD, no carotid bruits, no thyromegaly, no lymphadenopathy. Lungs:Clear bilaterally, no wheezes, rhonci, crackles Cardiovascular: Regular rate and rhythm. No murmurs, gallops or rubs. Abdomen:Soft. Bowel sounds present. Non-tender.  Extremities: No lower extremity edema. Pulses are 2 + in the bilateral DP/PT.  Stress myoview 01/13/13: Stress Procedure: The patient received IV Lexiscan 0.4 mg over 15-seconds. Technetium 11m Sestamibi injected at 30-seconds. Quantitative spect images were obtained after a 45 minute delay. During the infusion of Lexiscan, the patient complained of a "strange" feeling and a headache. These symptoms resolved in recovery.  Stress ECG: No significant change from baseline ECG  QPS  Raw Data Images: Normal; no motion artifact; normal heart/lung ratio.  Stress Images: There is decreased uptake in the inferior wall.  Rest Images: There is decreased uptake in the inferior wall.  Subtraction (SDS): mixed infarct and ischemia  Transient Ischemic Dilatation (Normal <1.22): 0.98  Lung/Heart Ratio (Normal <0.45): 0.40  Quantitative Gated Spect Images  QGS EDV: 126 ml  QGS ESV: 54 ml  Impression  Exercise Capacity: Lexiscan with no exercise.  BP Response: Normal blood pressure response.  Clinical Symptoms: Headache  ECG Impression: No significant ST segment change suggestive of ischemia.  Comparison with Prior Nuclear Study: No images to compare  Overall Impression: Low risk stress nuclear study Small inferior wall infarct from apex to base with mild peri infarct ischemia.  LV Ejection Fraction: 57%. LV Wall Motion: NL LV Function; NL Wall Motion  Cardiac cath 01/27/13: Left main: No obstructive disease.  Left Anterior Descending Artery: Large caliber vessel in the proximal segment with trifurcation in the mid segment into a large septal perforating branch, moderate caliber bifurcating diagonal branch and small to moderate caliber continuation of the LAD.  There appears to be a 40-50% stenosis at the trifurcation in the mid LAD. The diagonal branch bifurcates. The superior branch of the diagonal after the bifurcation is small in caliber with 70% stenosis.  Circumflex Artery: Large caliber vessel with large caliber  obtuse marginal branch. The proximal and mid vessel has diffuse 30% stenosis, unchanged. The obtuse marginal branch has diffuse 30% stenosis in the proximal segment of the vessel.  Right Coronary Artery: Large caliber dominant vessel with patent proximal and mid stented segment with 20% stent restenosis. Mild plaque in the large caliber PDA.   Echo 01/26/14: Left ventricle: The cavity size was normal. Systolic function was normal. The estimated ejection fraction was in the range of 55% to 60%. Wall motion was normal; there were no regional wall motion abnormalities. Doppler parameters are consistent with abnormal left ventricular relaxation (grade 1 diastolic dysfunction). - Left atrium: The atrium was mildly dilated. - Atrial septum: No defect or patent foramen ovale was identified.  EKG:  EKG is not ordered today. The ekg ordered today demonstrates   Recent Labs: 01/19/2014: TSH 3.97 03/08/2014: B Natriuretic Peptide 18.8; Hemoglobin 13.1; Platelets 232 12/05/2014: ALT 13; BUN 17; Creatinine, Ser 1.21; Potassium 4.3; Sodium 136   Lipid Panel    Component Value Date/Time   CHOL 180 12/05/2014 1126   TRIG * 12/05/2014 1126    470.0 Triglyceride is over 400; calculations on Lipids are invalid.   HDL 27.50* 12/05/2014 1126   CHOLHDL 7 12/05/2014 1126   VLDL 89.0* 11/04/2013 0905   LDLCALC 81 06/20/2011 0552   LDLDIRECT 85.0 12/05/2014 1126     Wt Readings from Last 3 Encounters:  01/12/15 251 lb (113.853 kg)  12/05/14 257 lb (116.574 kg)  05/12/14 280 lb (127.007 kg)     Other studies Reviewed: Additional studies/ records that were reviewed today include: . Review of the above records demonstrates:     Assessment and Plan:   1. CORONARY ARTERY DISEASE: Stable. I do not think his dyspnea is related to his mild to moderate CAD. He is known to have moderate LAD stenosis and patent stents RCA by cath January 2015. Echo 01/26/14 with normal LV function. Continue Plavix, beta blocker, statin.  No ASA since he is also on coumadin.   2. Paroxysmal atrial fibrillation: He is maintaining NSR. He did not want to start Xarelto due to cost. He has been maintained on coumadin therapy. He is now willing to change to Xarelto 20 mg daily and stop coumadin. Will continue Toprol at current dose.   3. Sleep apnea: He snores loudly at night, has excessive daytime fatigue with somnolence but trouble sleeping at night. Referral to Pulmonary but pt did not keep this appt.   4. Dyspnea: Non-cardiac. LV function normal by echo January 2016. No valve disease. Likely related to his weight and deconditioning.   5.  Diabetes mellitus: Followed in primary care   Current medicines are reviewed at length with the patient today.  The patient does not have concerns regarding medicines.  The following changes have been made:  Started Xarelto, stopped coumadin.   Labs/ tests ordered today include:   Orders Placed This Encounter  Procedures  . CBC w/Diff  . Basic Metabolic Panel (BMET)  . POCT INR    Disposition:   FU with me in 6 months  Signed, Lauree Chandler, MD 01/12/2015 9:46 AM    [facility] [address], [place], [place]  [address] Phone: [phone]; Fax: [phone]

## 2015-01-12 ENCOUNTER — Encounter: Payer: Self-pay | Admitting: Physical Therapy

## 2015-01-12 ENCOUNTER — Telehealth: Payer: Self-pay

## 2015-01-12 ENCOUNTER — Ambulatory Visit (INDEPENDENT_AMBULATORY_CARE_PROVIDER_SITE_OTHER): Payer: Medicare Other | Admitting: Cardiovascular Disease

## 2015-01-12 VITALS — BP 126/62 | HR 60 | Resp 12 | Ht 70.0 in | Wt 251.0 lb

## 2015-01-12 DIAGNOSIS — I480 Paroxysmal atrial fibrillation: Secondary | ICD-10-CM | POA: Diagnosis not present

## 2015-01-12 DIAGNOSIS — G4730 Sleep apnea, unspecified: Secondary | ICD-10-CM | POA: Diagnosis not present

## 2015-01-12 DIAGNOSIS — I6523 Occlusion and stenosis of bilateral carotid arteries: Secondary | ICD-10-CM

## 2015-01-12 DIAGNOSIS — I2510 Atherosclerotic heart disease of native coronary artery without angina pectoris: Secondary | ICD-10-CM

## 2015-01-12 DIAGNOSIS — R0600 Dyspnea, unspecified: Secondary | ICD-10-CM

## 2015-01-12 LAB — POCT INR: INR: 2.4

## 2015-01-12 MED ORDER — CLOPIDOGREL BISULFATE 75 MG PO TABS: 75.0000 mg | ORAL_TABLET | Freq: Every day | ORAL | Status: DC

## 2015-01-12 MED ORDER — RIVAROXABAN 20 MG PO TABS: 20.0000 mg | ORAL_TABLET | Freq: Every day | ORAL | Status: DC

## 2015-01-12 NOTE — Patient Instructions (Addendum)
Medication Instructions:  Your physician has recommended you make the following change in your medication:  Stop warfarin today Start Xarelto 20 mg by mouth daily with supper. Start tonight Resume Clopidogrel 75 mg by mouth daily.    Labwork: Your physician recommends that you return for lab work in one month--Scheduled for February 6,2017.  The lab opens at 7:30 AM   Testing/Procedures: none  Follow-Up: Your physician wants you to follow-up in: 6 months.  You will receive a reminder letter in the mail two months in advance. If you don't receive a letter, please call our office to schedule the follow-up appointment.   Any Other Special Instructions Will Be Listed Below (If Applicable).     If you need a refill on your cardiac medications before your next appointment, please call your pharmacy.

## 2015-01-12 NOTE — Telephone Encounter (Signed)
Prior auth for Xarelto 20mg sent to [facility]. 

## 2015-01-12 NOTE — Therapy (Signed)
[facility] [address] [place], [place], [address] Phone: [phone]   Fax:  [phone]  Patient Details  Name: Gary Yates. MRN: [id number] Date of Birth: August 25, 1933 Referring Provider:  No ref. provider found  Encounter Date: 01/12/2015   PHYSICAL THERAPY DISCHARGE SUMMARY  Visits from Start of Care: 3  Current functional level related to goals / functional outcomes: Did not return so unknown   Remaining deficits: Did not return so unknown   Education / Equipment: HEP initiated Plan: Patient agrees to discharge.  Patient goals were not met. Patient is being discharged due to not returning since the last visit.  ?????       Lakisa Lotz PT, DPT 01/12/2015, 1:14 PM  [facility] [address] [place], [place], [address] Phone: [phone]   Fax:  [phone]

## 2015-01-13 ENCOUNTER — Telehealth: Payer: Self-pay

## 2015-01-13 NOTE — Telephone Encounter (Signed)
Xarelto approved through 01/07/2016. PA # [id number].

## 2015-01-20 ENCOUNTER — Other Ambulatory Visit: Payer: Self-pay | Admitting: Neurological Surgery

## 2015-01-28 ENCOUNTER — Other Ambulatory Visit: Payer: Self-pay | Admitting: Family Medicine

## 2015-02-01 ENCOUNTER — Other Ambulatory Visit: Payer: Self-pay | Admitting: Family Medicine

## 2015-02-01 ENCOUNTER — Encounter (HOSPITAL_COMMUNITY): Payer: Self-pay

## 2015-02-01 ENCOUNTER — Encounter (HOSPITAL_COMMUNITY)
Admission: RE | Admit: 2015-02-01 | Discharge: 2015-02-01 | Disposition: A | Discharge status: Home / Self Care | Payer: Medicare Other | Source: Ambulatory Visit | Attending: Neurological Surgery | Admitting: Neurological Surgery

## 2015-02-01 DIAGNOSIS — I10 Essential (primary) hypertension: Secondary | ICD-10-CM | POA: Diagnosis not present

## 2015-02-01 DIAGNOSIS — I2510 Atherosclerotic heart disease of native coronary artery without angina pectoris: Secondary | ICD-10-CM | POA: Diagnosis not present

## 2015-02-01 DIAGNOSIS — Z01812 Encounter for preprocedural laboratory examination: Secondary | ICD-10-CM | POA: Insufficient documentation

## 2015-02-01 DIAGNOSIS — Z79899 Other long term (current) drug therapy: Secondary | ICD-10-CM | POA: Insufficient documentation

## 2015-02-01 DIAGNOSIS — G2581 Restless legs syndrome: Secondary | ICD-10-CM | POA: Insufficient documentation

## 2015-02-01 DIAGNOSIS — M47816 Spondylosis without myelopathy or radiculopathy, lumbar region: Secondary | ICD-10-CM | POA: Diagnosis not present

## 2015-02-01 DIAGNOSIS — Z01818 Encounter for other preprocedural examination: Secondary | ICD-10-CM | POA: Diagnosis present

## 2015-02-01 DIAGNOSIS — E1142 Type 2 diabetes mellitus with diabetic polyneuropathy: Secondary | ICD-10-CM | POA: Diagnosis not present

## 2015-02-01 DIAGNOSIS — F419 Anxiety disorder, unspecified: Secondary | ICD-10-CM | POA: Insufficient documentation

## 2015-02-01 DIAGNOSIS — M4806 Spinal stenosis, lumbar region: Secondary | ICD-10-CM | POA: Diagnosis not present

## 2015-02-01 DIAGNOSIS — Z794 Long term (current) use of insulin: Secondary | ICD-10-CM | POA: Insufficient documentation

## 2015-02-01 DIAGNOSIS — K219 Gastro-esophageal reflux disease without esophagitis: Secondary | ICD-10-CM | POA: Diagnosis not present

## 2015-02-01 DIAGNOSIS — Z7902 Long term (current) use of antithrombotics/antiplatelets: Secondary | ICD-10-CM | POA: Diagnosis not present

## 2015-02-01 DIAGNOSIS — Z955 Presence of coronary angioplasty implant and graft: Secondary | ICD-10-CM | POA: Diagnosis not present

## 2015-02-01 DIAGNOSIS — E785 Hyperlipidemia, unspecified: Secondary | ICD-10-CM | POA: Diagnosis not present

## 2015-02-01 HISTORY — DX: Cardiac arrhythmia, unspecified: I49.9

## 2015-02-01 HISTORY — DX: Frequency of micturition: R35.0

## 2015-02-01 HISTORY — DX: Personal history of urinary calculi: Z87.442

## 2015-02-01 HISTORY — DX: Personal history of other diseases of the respiratory system: Z87.09

## 2015-02-01 HISTORY — DX: Anxiety disorder, unspecified: F41.9

## 2015-02-01 LAB — GLUCOSE, CAPILLARY: GLUCOSE-CAPILLARY: 290 mg/dL — AB (ref 65–99)

## 2015-02-01 LAB — CBC
HEMATOCRIT: 38.2 % — AB (ref 39.0–52.0)
HEMOGLOBIN: 12.6 g/dL — AB (ref 13.0–17.0)
MCH: 29.9 pg (ref 26.0–34.0)
MCHC: 33 g/dL (ref 30.0–36.0)
MCV: 90.5 fL (ref 78.0–100.0)
Platelets: 213 10*3/uL (ref 150–400)
RBC: 4.22 MIL/uL (ref 4.22–5.81)
RDW: 13.5 % (ref 11.5–15.5)
WBC: 5.6 10*3/uL (ref 4.0–10.5)

## 2015-02-01 LAB — BASIC METABOLIC PANEL
ANION GAP: 7 (ref 5–15)
BUN: 13 mg/dL (ref 6–20)
CHLORIDE: 104 mmol/L (ref 101–111)
CO2: 24 mmol/L (ref 22–32)
Calcium: 9.7 mg/dL (ref 8.9–10.3)
Creatinine, Ser: 1.2 mg/dL (ref 0.61–1.24)
GFR calc non Af Amer: 55 mL/min — ABNORMAL LOW (ref >60)
Glucose, Bld: 284 mg/dL — ABNORMAL HIGH (ref 65–99)
Potassium: 4.2 mmol/L (ref 3.5–5.1)
Sodium: 135 mmol/L (ref 135–145)

## 2015-02-01 LAB — SURGICAL PCR SCREEN
MRSA, PCR: NEGATIVE
Staphylococcus aureus: POSITIVE — AB

## 2015-02-01 NOTE — Progress Notes (Signed)
   02/01/15 1142  OBSTRUCTIVE SLEEP APNEA  Have you ever been diagnosed with sleep apnea through a sleep study? No  Do you snore loudly (loud enough to be heard through closed doors)?  1  Do you often feel tired, fatigued, or sleepy during the daytime (such as falling asleep during driving or talking to someone)? 1  Has anyone observed you stop breathing during your sleep? 0  Do you have, or are you being treated for high blood pressure? 1  BMI more than 35 kg/m2? 1  Age > 50 (1-yes) 1  Neck circumference greater than:Male 16 inches or larger, Male 17inches or larger? 1  Male Gender (Yes=1) 1  Obstructive Sleep Apnea Score 7

## 2015-02-01 NOTE — Pre-Procedure Instructions (Addendum)
Cleone Slim.  02/01/2015      [id number] [name], [place] [facility] [address] [place] [address] [place] [address] Phone: [phone] Fax: [place], [facility] [place] [address] [place] [address] [place] [address] Phone: [phone] Fax: [phone]    Your procedure is scheduled on Tuesday, January 31st, 2017.  Report to [facility] Admitting at 9:45 A.M.  Call this number if you have problems the morning of surgery:  [phone]   Remember:  Do not eat food or drink liquids after midnight.   Take these medicines the morning of surgery with A SIP OF WATER: Gabapentin (Neurontin), Hydromorphone (Dilaudid) if needed, Metoprolol Succinate (Toprol-XL), Myrbetriq, Oxybutynin (Ditropan), Oxycodone-acetaminophen (Percocet) if needed, Afrin nasal spray if needed, Pantoprazole (Protonix).   What do I do about my diabetes medications?   Do not take oral diabetes medicines (pills) the morning of surgery.                                                             THE NIGHT BEFORE SURGERY, take 45 units of Toujeo Insulin.   THE MORNING OF SURGERY, take 25 units of Toujeo Insulin.     THE MORNING OF SURGERY, take 0 units of Humalog Insulin.    If your CBG is greater than 220 mg/dL, you may take 1/2 of your sliding scale (correction) dose of insulin.  If blood sugar is greater than 220, take 9 units of Humalog.     Per MD order, stop taking Xarelto 3 days prior to surgery.    Stop taking: Aspirin, NSAIDS, Aleve, Naproxen, Ibuprofen, Advil, Motrin, BC's, Goody's, Fish oil, all herbal medications, and all vitamins.     Do not wear jewelry.  Do not wear lotions, powders, or cologones.  You may NOT wear deodorant.   Men may shave face and neck.  Do not bring valuables to the hospital.  [facility] is not responsible for any belongings or valuables.  Contacts,  dentures or bridgework may not be worn into surgery.  Leave your suitcase in the car.  After surgery it may be brought to your room.  For patients admitted to the hospital, discharge time will be determined by your treatment team.  Patients discharged the day of surgery will not be allowed to drive home.   Special instructions:  See attached.   Please read over the following fact sheets that you were given. Pain Booklet, Coughing and Deep Breathing, MRSA Information and Surgical Site Infection Prevention    How to Manage Your Diabetes Before Surgery   Why is it important to control my blood sugar before and after surgery?   Improving blood sugar levels before and after surgery helps healing and can limit problems.  A way of improving blood sugar control is eating a healthy diet by:  - Eating less sugar and carbohydrates  - Increasing activity/exercise  - Talk with your doctor about reaching your blood sugar goals  High blood sugars (greater than 180 mg/dL) can raise your risk of infections and slow down your recovery so you will need to focus on controlling your diabetes during the weeks before surgery.  Make sure that the  doctor who takes care of your diabetes knows about your planned surgery including the date and location.  How do I manage my blood sugars before surgery?   Check your blood sugar at least 4 times a day, 2 days before surgery to make sure that they are not too high or low.   Check your blood sugar the morning of your surgery when you wake up and every 2 hours until you get to the Short-Stay unit.  If your blood sugar is less than 70 mg/dL, you will need to treat for low blood sugar by:  Treat a low blood sugar (less than 70 mg/dL) with 1/2 cup of clear juice (cranberry or apple), 4 glucose tablets, OR glucose gel.  Recheck blood sugar in 15 minutes after treatment (to make sure it is greater than 70 mg/dL).  If blood sugar is not greater than 70 mg/dL on  re-check, call [phone] for further instructions.   Report your blood sugar to the Short-Stay nurse when you get to Short-Stay.  References:  [facility], 2007 "How to Manage your Diabetes Before and After Surgery".

## 2015-02-01 NOTE — Progress Notes (Signed)
PCP - Dr. Carolann Littler Cardiologist - Dr. Angelena Form - clearance in chart  EKG - 03/2014 CXR - 03/2014 Echo - 01/2014 Cardiac Cath - 01/2013  Patient denies chest pain and shortness of breath at PAT appointment.  Patient is to stop taking Xarelto 3 days prior to surgery.   PT-INR will be obtained day of surgery.  Patient stopped Plavix on 01/30/15.  Patient states that he is not consistent at checking his blood sugar at homes and that he sometimes forgets to take his blood sugar medicine.  Patient became tearful when discussing and says things have just been difficult with wife's recent health issues.  Nurse educated to patient importance of checking blood sugar 4 times a day, taking medication as prescribed, and signs and symptoms of low blood sugar.  Patient verbalized understanding.  Patient's son was present for preadmission appointment and stated that he would help get medication scheduled.

## 2015-02-01 NOTE — Progress Notes (Signed)
Patient notified of positive PCR and verbalized understanding.  [facility] [address]

## 2015-02-02 NOTE — Progress Notes (Addendum)
Anesthesia chart review: Patient is an 82 year old male scheduled for L3-S1 decompression on 02/07/2015 by Dr. Cyndy Freeze.  History includes CAD S/P overlapping RCA stents '04, SVT, PAF, hypertension, hyperlipidemia, GERD, RLS, diabetes mellitus type 2 with peripheral neuropathy, anxiety, exertional dyspnea, nephrolithiasis. BMI is consistent with obesity. PCP is Dr. Carolann Littler.   Cardiologist is Dr. Angelena Form who cleared patient for for surgery with permission holds a Xarelto for 3 days and Plavix for 7 days prior to his procedure.  Meds include Lipitor, Klonopin, Plavix, Lofibra, Neurontin, Dilantin, glargine, Humalog, metformin, Toprol, Myrbetriq, Nitro, Ditropan XL, Percocet, Protonix, Mirapex, Xarelto, Flomax.  PAT Vitals: BP 152/65, HR 72, RR 18, T 37.2C, O2 sat 95%. CBG 290 (had eaten lunch).  03/08/14 EKG: NSR, right BBB, LAFB, bifascicular block.  01/26/14 Echo: Study Conclusions - Left ventricle: The cavity size was normal. Systolic function was normal. The estimated ejection fraction was in the range of 55% to 60%. Wall motion was normal; there were no regional wall motion abnormalities. Doppler parameters are consistent with abnormal left ventricular relaxation (grade 1 diastolic dysfunction). - Left atrium: The atrium was mildly dilated. - Atrial septum: No defect or patent foramen ovale was identified.  01/27/13 Cardiac cath: Hemodynamic Findings: Central aortic pressure: 144/82 Left ventricular pressure: 146/10/13 Angiographic Findings: Left main: No obstructive disease.  Left Anterior Descending Artery: Large caliber vessel in the proximal segment with trifurcation in the mid segment into a large septal perforating branch, moderate caliber bifurcating diagonal branch and small to moderate caliber continuation of the LAD. There appears to be a 40-50% stenosis at the trifurcation in the mid LAD. The diagonal branch bifurcates. The superior branch of the diagonal after  the bifurcation is small in caliber with 70% stenosis.  Circumflex Artery: Large caliber vessel with large caliber obtuse marginal branch. The proximal and mid vessel has diffuse 30% stenosis, unchanged. The obtuse marginal branch has diffuse 30% stenosis in the proximal segment of the vessel.  Right Coronary Artery: Large caliber dominant vessel with patent proximal and mid stented segment with 20% stent restenosis. Mild plaque in the large caliber PDA.  Left Ventricular Angiogram: Deferred.  Impression: 1. Double vessel CAD with patent stents RCA, moderate disease in the LAD which is unchanged from last cath in 2013.  2. Dyspnea and fatigue likely multifactorial 3. No focal targets for PCI Recommendations: Continue medical management of CAD. Continue to attempt weight loss.   01/13/13 Nuclear stress test: Overall Impression: Low risk stress nuclear study Small inferior wall infarct from apex to base with mild peri infarct ischemia. LV Ejection Fraction: 57%. LV Wall Motion: NL LV Function; NL Wall Motion.  06/25/11 - 07/15/11 Event Monitor: NSR, PVCs, IVCD.  11/30/14 Carotid U/S: Smooth plaque, bilaterally. 1-39% bilateral ICA stenosis. Patent vertebral arteries with antegrade flow. Elevated right subclavian velocities (50% R SCA stenosis by 11/2012 U/S).   03/08/14 CXR: IMPRESSION: Cardiomegaly with pulmonary vascular congestion. Underlying emphysema.  Preoperative labs noted. A1c is still not resulted--[facility] referred me to [facility] which is where the A1c is done. [facility] said they never received a specimen, so [facility] lab staff are attempting to send specimen today. (A1c 11/25/14 was 11.8 which correlates with an average glucose in the 290's. He was started on metformin at that time by his PCP). He is for PT/INR on the day of surgery.   Per PAT RN notes, patient has not been very consistent with checking home CBGs. She reinforced need for home glucose monitoring. I also called and  spoke with  patient today. His fasting CBG this morning was 153. He is taking his DM meds. He understands to continue home glucose monitoring and reporting fasting glucose levels > 200 to his PCP as a fasting CBG much over 200 on the day of surgery could cancel or delay his surgery. I have also updated Jessica at Dr. Hewitt Shorts office. (Update: 02/01/15 A1c 10.7, down from 11.8.)  George Hugh [facility] Short Stay Center/Anesthesiology Phone [phone] 02/02/2015 12:26 PM

## 2015-02-03 LAB — HEMOGLOBIN A1C
Hgb A1c MFr Bld: 10.7 % — ABNORMAL HIGH (ref 4.8–5.6)
MEAN PLASMA GLUCOSE: 260 mg/dL

## 2015-02-06 MED ORDER — VANCOMYCIN HCL 10 G IV SOLR
1500.0000 mg | INTRAVENOUS | Status: DC
  Filled 2015-02-06: qty 1500

## 2015-02-07 ENCOUNTER — Encounter (HOSPITAL_COMMUNITY): Payer: Self-pay | Admitting: *Deleted

## 2015-02-07 ENCOUNTER — Ambulatory Visit (HOSPITAL_COMMUNITY): Payer: Medicare Other

## 2015-02-07 ENCOUNTER — Ambulatory Visit (HOSPITAL_COMMUNITY): Payer: Medicare Other | Admitting: Vascular Surgery

## 2015-02-07 ENCOUNTER — Inpatient Hospital Stay (HOSPITAL_COMMUNITY)
Admission: AD | Admit: 2015-02-07 | Discharge: 2015-02-13 | DRG: 516 | Disposition: A | Discharge status: Skilled Nursing Facility | Payer: Medicare Other | Source: Ambulatory Visit | Attending: Neurological Surgery | Admitting: Neurological Surgery

## 2015-02-07 ENCOUNTER — Encounter (HOSPITAL_COMMUNITY)
Admission: AD | Disposition: A | Discharge status: Skilled Nursing Facility | Payer: Self-pay | Source: Ambulatory Visit | Attending: Neurological Surgery

## 2015-02-07 ENCOUNTER — Ambulatory Visit (HOSPITAL_COMMUNITY): Payer: Medicare Other | Admitting: Anesthesiology

## 2015-02-07 DIAGNOSIS — K219 Gastro-esophageal reflux disease without esophagitis: Secondary | ICD-10-CM | POA: Diagnosis present

## 2015-02-07 DIAGNOSIS — Z7902 Long term (current) use of antithrombotics/antiplatelets: Secondary | ICD-10-CM | POA: Diagnosis not present

## 2015-02-07 DIAGNOSIS — Z87891 Personal history of nicotine dependence: Secondary | ICD-10-CM | POA: Diagnosis not present

## 2015-02-07 DIAGNOSIS — J449 Chronic obstructive pulmonary disease, unspecified: Secondary | ICD-10-CM | POA: Diagnosis present

## 2015-02-07 DIAGNOSIS — Z955 Presence of coronary angioplasty implant and graft: Secondary | ICD-10-CM

## 2015-02-07 DIAGNOSIS — Z7984 Long term (current) use of oral hypoglycemic drugs: Secondary | ICD-10-CM | POA: Diagnosis not present

## 2015-02-07 DIAGNOSIS — E669 Obesity, unspecified: Secondary | ICD-10-CM | POA: Diagnosis present

## 2015-02-07 DIAGNOSIS — E11649 Type 2 diabetes mellitus with hypoglycemia without coma: Secondary | ICD-10-CM | POA: Diagnosis not present

## 2015-02-07 DIAGNOSIS — I472 Ventricular tachycardia: Secondary | ICD-10-CM | POA: Diagnosis present

## 2015-02-07 DIAGNOSIS — I10 Essential (primary) hypertension: Secondary | ICD-10-CM | POA: Diagnosis present

## 2015-02-07 DIAGNOSIS — E1151 Type 2 diabetes mellitus with diabetic peripheral angiopathy without gangrene: Secondary | ICD-10-CM | POA: Diagnosis present

## 2015-02-07 DIAGNOSIS — Z419 Encounter for procedure for purposes other than remedying health state, unspecified: Secondary | ICD-10-CM

## 2015-02-07 DIAGNOSIS — Z794 Long term (current) use of insulin: Secondary | ICD-10-CM | POA: Diagnosis not present

## 2015-02-07 DIAGNOSIS — M4806 Spinal stenosis, lumbar region: Secondary | ICD-10-CM | POA: Diagnosis present

## 2015-02-07 DIAGNOSIS — M4606 Spinal enthesopathy, lumbar region: Secondary | ICD-10-CM | POA: Diagnosis present

## 2015-02-07 DIAGNOSIS — Z6836 Body mass index (BMI) 36.0-36.9, adult: Secondary | ICD-10-CM | POA: Diagnosis not present

## 2015-02-07 DIAGNOSIS — M4726 Other spondylosis with radiculopathy, lumbar region: Secondary | ICD-10-CM | POA: Diagnosis present

## 2015-02-07 DIAGNOSIS — M4727 Other spondylosis with radiculopathy, lumbosacral region: Secondary | ICD-10-CM | POA: Diagnosis present

## 2015-02-07 DIAGNOSIS — E1142 Type 2 diabetes mellitus with diabetic polyneuropathy: Secondary | ICD-10-CM | POA: Diagnosis present

## 2015-02-07 DIAGNOSIS — I2510 Atherosclerotic heart disease of native coronary artery without angina pectoris: Secondary | ICD-10-CM | POA: Diagnosis present

## 2015-02-07 DIAGNOSIS — R338 Other retention of urine: Secondary | ICD-10-CM | POA: Diagnosis not present

## 2015-02-07 DIAGNOSIS — N9989 Other postprocedural complications and disorders of genitourinary system: Secondary | ICD-10-CM | POA: Diagnosis not present

## 2015-02-07 HISTORY — PX: LUMBAR LAMINECTOMY/DECOMPRESSION MICRODISCECTOMY: SHX5026

## 2015-02-07 LAB — GLUCOSE, CAPILLARY
GLUCOSE-CAPILLARY: 117 mg/dL — AB (ref 65–99)
Glucose-Capillary: 102 mg/dL — ABNORMAL HIGH (ref 65–99)
Glucose-Capillary: 87 mg/dL (ref 65–99)

## 2015-02-07 LAB — PROTIME-INR
INR: 1.08 (ref 0.00–1.49)
PROTHROMBIN TIME: 14.2 s (ref 11.6–15.2)

## 2015-02-07 SURGERY — LUMBAR LAMINECTOMY/DECOMPRESSION MICRODISCECTOMY 3 LEVELS
Anesthesia: General | Site: Back

## 2015-02-07 MED ORDER — INSULIN GLARGINE 100 UNIT/ML ~~LOC~~ SOLN
60.0000 [IU] | Freq: Two times a day (BID) | SUBCUTANEOUS | Status: DC
  Administered 2015-02-09 – 2015-02-12 (×7): 60 [IU] via SUBCUTANEOUS
  Filled 2015-02-07 (×11): qty 0.6

## 2015-02-07 MED ORDER — NEOSTIGMINE METHYLSULFATE 10 MG/10ML IV SOLN
INTRAVENOUS | Status: DC | PRN
  Administered 2015-02-07: 4 mg via INTRAVENOUS

## 2015-02-07 MED ORDER — OXYMETAZOLINE HCL 0.05 % NA SOLN
1.0000 | Freq: Two times a day (BID) | NASAL | Status: DC | PRN
Start: 2015-02-07 — End: 2015-02-13
  Filled 2015-02-07: qty 15

## 2015-02-07 MED ORDER — METHOCARBAMOL 1000 MG/10ML IJ SOLN
500.0000 mg | Freq: Four times a day (QID) | INTRAVENOUS | Status: DC | PRN
  Filled 2015-02-07: qty 5

## 2015-02-07 MED ORDER — DOCUSATE SODIUM 100 MG PO CAPS
100.0000 mg | ORAL_CAPSULE | Freq: Two times a day (BID) | ORAL | Status: DC
  Administered 2015-02-07 – 2015-02-13 (×12): 100 mg via ORAL
  Filled 2015-02-07 (×12): qty 1

## 2015-02-07 MED ORDER — THROMBIN 20000 UNITS EX SOLR
CUTANEOUS | Status: DC | PRN
  Administered 2015-02-07: 14:00:00 via TOPICAL

## 2015-02-07 MED ORDER — ATORVASTATIN CALCIUM 40 MG PO TABS
40.0000 mg | ORAL_TABLET | Freq: Every day | ORAL | Status: DC
  Administered 2015-02-07 – 2015-02-13 (×7): 40 mg via ORAL
  Filled 2015-02-07 (×7): qty 1

## 2015-02-07 MED ORDER — GLYCOPYRROLATE 0.2 MG/ML IJ SOLN
INTRAMUSCULAR | Status: AC
  Filled 2015-02-07: qty 3

## 2015-02-07 MED ORDER — FLEET ENEMA 7-19 GM/118ML RE ENEM: 1.0000 | ENEMA | Freq: Once | RECTAL | Status: DC | PRN

## 2015-02-07 MED ORDER — SODIUM CHLORIDE 0.9 % IV SOLN: 250.0000 mL | INTRAVENOUS | Status: DC

## 2015-02-07 MED ORDER — BUPIVACAINE HCL (PF) 0.25 % IJ SOLN
INTRAMUSCULAR | Status: DC | PRN
  Administered 2015-02-07: 15 mL

## 2015-02-07 MED ORDER — HYDROCODONE-ACETAMINOPHEN 5-325 MG PO TABS
1.0000 | ORAL_TABLET | ORAL | Status: DC | PRN
  Administered 2015-02-07: 2 via ORAL
  Administered 2015-02-09: 1 via ORAL
  Administered 2015-02-10: 2 via ORAL
  Administered 2015-02-12: 1 via ORAL
  Administered 2015-02-12: 2 via ORAL
  Administered 2015-02-13: 1 via ORAL
  Filled 2015-02-07 (×2): qty 2
  Filled 2015-02-07 (×2): qty 1
  Filled 2015-02-07 (×2): qty 2

## 2015-02-07 MED ORDER — PANTOPRAZOLE SODIUM 40 MG IV SOLR: 40.0000 mg | Freq: Every day | INTRAVENOUS | Status: DC

## 2015-02-07 MED ORDER — INSULIN ASPART 100 UNIT/ML ~~LOC~~ SOLN
18.0000 [IU] | Freq: Three times a day (TID) | SUBCUTANEOUS | Status: DC
  Administered 2015-02-09 – 2015-02-10 (×3): 18 [IU] via SUBCUTANEOUS

## 2015-02-07 MED ORDER — SODIUM CHLORIDE 0.9 % IV SOLN
INTRAVENOUS | Status: DC
  Administered 2015-02-07 – 2015-02-08 (×2): via INTRAVENOUS

## 2015-02-07 MED ORDER — SODIUM CHLORIDE 0.9% FLUSH
3.0000 mL | Freq: Two times a day (BID) | INTRAVENOUS | Status: DC
  Administered 2015-02-08 – 2015-02-13 (×10): 3 mL via INTRAVENOUS

## 2015-02-07 MED ORDER — FENTANYL CITRATE (PF) 100 MCG/2ML IJ SOLN
INTRAMUSCULAR | Status: DC | PRN
  Administered 2015-02-07 (×5): 50 ug via INTRAVENOUS

## 2015-02-07 MED ORDER — ONDANSETRON HCL 4 MG/2ML IJ SOLN
INTRAMUSCULAR | Status: AC
  Filled 2015-02-07: qty 4

## 2015-02-07 MED ORDER — FENTANYL CITRATE (PF) 250 MCG/5ML IJ SOLN
INTRAMUSCULAR | Status: AC
  Filled 2015-02-07: qty 5

## 2015-02-07 MED ORDER — INSULIN LISPRO 100 UNIT/ML (KWIKPEN)
18.0000 [IU] | PEN_INJECTOR | Freq: Three times a day (TID) | SUBCUTANEOUS | Status: DC
  Filled 2015-02-07: qty 3

## 2015-02-07 MED ORDER — MIRABEGRON ER 25 MG PO TB24
50.0000 mg | ORAL_TABLET | Freq: Every day | ORAL | Status: DC
  Administered 2015-02-07 – 2015-02-09 (×3): 50 mg via ORAL
  Filled 2015-02-07 (×3): qty 2

## 2015-02-07 MED ORDER — INSULIN GLARGINE 300 UNIT/ML ~~LOC~~ SOPN: 60.0000 [IU] | PEN_INJECTOR | Freq: Two times a day (BID) | SUBCUTANEOUS | Status: DC

## 2015-02-07 MED ORDER — THROMBIN 5000 UNITS EX SOLR
OROMUCOSAL | Status: DC | PRN
  Administered 2015-02-07 (×2)

## 2015-02-07 MED ORDER — HYDROMORPHONE HCL 1 MG/ML IJ SOLN
0.2500 mg | INTRAMUSCULAR | Status: DC | PRN
  Administered 2015-02-07 (×2): 0.5 mg via INTRAVENOUS

## 2015-02-07 MED ORDER — PANTOPRAZOLE SODIUM 40 MG PO TBEC
40.0000 mg | DELAYED_RELEASE_TABLET | Freq: Every day | ORAL | Status: DC
  Administered 2015-02-07 – 2015-02-13 (×7): 40 mg via ORAL
  Filled 2015-02-07 (×7): qty 1

## 2015-02-07 MED ORDER — SODIUM CHLORIDE 0.9% FLUSH: 3.0000 mL | INTRAVENOUS | Status: DC | PRN

## 2015-02-07 MED ORDER — ARTIFICIAL TEARS OP OINT
TOPICAL_OINTMENT | OPHTHALMIC | Status: DC | PRN
  Administered 2015-02-07: 1 via OPHTHALMIC

## 2015-02-07 MED ORDER — VANCOMYCIN HCL 1000 MG IV SOLR
INTRAVENOUS | Status: DC | PRN
  Administered 2015-02-07 (×2): 1000 mg via TOPICAL

## 2015-02-07 MED ORDER — ATORVASTATIN CALCIUM 40 MG PO TABS: 40.0000 mg | ORAL_TABLET | Freq: Every day | ORAL | Status: DC

## 2015-02-07 MED ORDER — PROPOFOL 10 MG/ML IV BOLUS
INTRAVENOUS | Status: AC
  Filled 2015-02-07: qty 20

## 2015-02-07 MED ORDER — METOPROLOL SUCCINATE ER 100 MG PO TB24
100.0000 mg | ORAL_TABLET | Freq: Every day | ORAL | Status: DC
  Administered 2015-02-09 – 2015-02-13 (×5): 100 mg via ORAL
  Filled 2015-02-07 (×9): qty 1

## 2015-02-07 MED ORDER — VANCOMYCIN HCL 1000 MG IV SOLR
INTRAVENOUS | Status: AC
Start: 2015-02-07 — End: 2015-02-08
  Filled 2015-02-07: qty 1000

## 2015-02-07 MED ORDER — ARTIFICIAL TEARS OP OINT
TOPICAL_OINTMENT | OPHTHALMIC | Status: AC
  Filled 2015-02-07: qty 3.5

## 2015-02-07 MED ORDER — ACETAMINOPHEN 650 MG RE SUPP: 650.0000 mg | RECTAL | Status: DC | PRN

## 2015-02-07 MED ORDER — METHOCARBAMOL 500 MG PO TABS
500.0000 mg | ORAL_TABLET | Freq: Four times a day (QID) | ORAL | Status: DC | PRN
  Administered 2015-02-07 – 2015-02-12 (×8): 500 mg via ORAL
  Filled 2015-02-07 (×8): qty 1

## 2015-02-07 MED ORDER — NITROGLYCERIN 0.4 MG SL SUBL: 0.4000 mg | SUBLINGUAL_TABLET | SUBLINGUAL | Status: DC | PRN

## 2015-02-07 MED ORDER — LIDOCAINE HCL (CARDIAC) 20 MG/ML IV SOLN
INTRAVENOUS | Status: AC
  Filled 2015-02-07: qty 5

## 2015-02-07 MED ORDER — LACTATED RINGERS IV SOLN: INTRAVENOUS | Status: DC | PRN

## 2015-02-07 MED ORDER — BUPIVACAINE LIPOSOME 1.3 % IJ SUSP
20.0000 mL | INTRAMUSCULAR | Status: DC
  Filled 2015-02-07: qty 20

## 2015-02-07 MED ORDER — HYDROMORPHONE HCL 1 MG/ML IJ SOLN
INTRAMUSCULAR | Status: AC
  Filled 2015-02-07: qty 1

## 2015-02-07 MED ORDER — VANCOMYCIN HCL IN DEXTROSE 1-5 GM/200ML-% IV SOLN
INTRAVENOUS | Status: AC
  Administered 2015-02-07: 1000 mg via INTRAVENOUS
  Filled 2015-02-07: qty 200

## 2015-02-07 MED ORDER — LACTATED RINGERS IV SOLN
INTRAVENOUS | Status: DC
  Administered 2015-02-07 (×3): via INTRAVENOUS

## 2015-02-07 MED ORDER — ROCURONIUM BROMIDE 100 MG/10ML IV SOLN
INTRAVENOUS | Status: DC | PRN
  Administered 2015-02-07: 50 mg via INTRAVENOUS
  Administered 2015-02-07: 10 mg via INTRAVENOUS

## 2015-02-07 MED ORDER — TAMSULOSIN HCL 0.4 MG PO CAPS
0.4000 mg | ORAL_CAPSULE | Freq: Every day | ORAL | Status: DC | PRN
Start: 2015-02-07 — End: 2015-02-13
  Administered 2015-02-07 – 2015-02-13 (×3): 0.4 mg via ORAL
  Filled 2015-02-07 (×3): qty 1

## 2015-02-07 MED ORDER — EPHEDRINE SULFATE 50 MG/ML IJ SOLN
INTRAMUSCULAR | Status: AC
  Filled 2015-02-07: qty 1

## 2015-02-07 MED ORDER — VANCOMYCIN HCL 1000 MG IV SOLR
INTRAVENOUS | Status: AC
  Filled 2015-02-07: qty 1000

## 2015-02-07 MED ORDER — METFORMIN HCL 500 MG PO TABS
500.0000 mg | ORAL_TABLET | Freq: Two times a day (BID) | ORAL | Status: DC
  Administered 2015-02-07 – 2015-02-13 (×12): 500 mg via ORAL
  Filled 2015-02-07 (×12): qty 1

## 2015-02-07 MED ORDER — ONDANSETRON HCL 4 MG/2ML IJ SOLN: 4.0000 mg | INTRAMUSCULAR | Status: DC | PRN

## 2015-02-07 MED ORDER — PRAMIPEXOLE DIHYDROCHLORIDE 1.5 MG PO TABS
1.5000 mg | ORAL_TABLET | Freq: Every day | ORAL | Status: DC
  Administered 2015-02-07 – 2015-02-12 (×6): 1.5 mg via ORAL
  Filled 2015-02-07 (×9): qty 1

## 2015-02-07 MED ORDER — GLYCOPYRROLATE 0.2 MG/ML IJ SOLN
INTRAMUSCULAR | Status: DC | PRN
  Administered 2015-02-07: 0.6 mg via INTRAVENOUS

## 2015-02-07 MED ORDER — LIDOCAINE HCL (CARDIAC) 20 MG/ML IV SOLN
INTRAVENOUS | Status: DC | PRN
  Administered 2015-02-07: 60 mg via INTRAVENOUS

## 2015-02-07 MED ORDER — ZOLPIDEM TARTRATE 5 MG PO TABS
5.0000 mg | ORAL_TABLET | Freq: Every evening | ORAL | Status: DC | PRN
  Administered 2015-02-08 – 2015-02-10 (×3): 5 mg via ORAL
  Filled 2015-02-07 (×3): qty 1

## 2015-02-07 MED ORDER — ADULT MULTIVITAMIN W/MINERALS CH
1.0000 | ORAL_TABLET | Freq: Every day | ORAL | Status: DC
  Administered 2015-02-08 – 2015-02-13 (×6): 1 via ORAL
  Filled 2015-02-07 (×7): qty 1

## 2015-02-07 MED ORDER — EPHEDRINE SULFATE 50 MG/ML IJ SOLN
INTRAMUSCULAR | Status: DC | PRN
  Administered 2015-02-07 (×3): 10 mg via INTRAVENOUS

## 2015-02-07 MED ORDER — PHENYLEPHRINE HCL 10 MG/ML IJ SOLN
10.0000 mg | INTRAMUSCULAR | Status: DC | PRN
  Administered 2015-02-07: 15 ug/min via INTRAVENOUS

## 2015-02-07 MED ORDER — CEFAZOLIN SODIUM 1-5 GM-% IV SOLN
1.0000 g | Freq: Three times a day (TID) | INTRAVENOUS | Status: AC
Start: 2015-02-07 — End: 2015-02-08
  Administered 2015-02-07 – 2015-02-08 (×2): 1 g via INTRAVENOUS
  Filled 2015-02-07 (×3): qty 50

## 2015-02-07 MED ORDER — SODIUM CHLORIDE 0.9 % IR SOLN
Status: DC | PRN
  Administered 2015-02-07: 500 mL

## 2015-02-07 MED ORDER — OXYCODONE-ACETAMINOPHEN 5-325 MG PO TABS
1.0000 | ORAL_TABLET | ORAL | Status: DC | PRN
  Administered 2015-02-07 – 2015-02-09 (×5): 2 via ORAL
  Administered 2015-02-09: 1 via ORAL
  Administered 2015-02-10 – 2015-02-13 (×11): 2 via ORAL
  Filled 2015-02-07 (×17): qty 2

## 2015-02-07 MED ORDER — MIDAZOLAM HCL 2 MG/2ML IJ SOLN
INTRAMUSCULAR | Status: AC
  Filled 2015-02-07: qty 2

## 2015-02-07 MED ORDER — PROPOFOL 10 MG/ML IV BOLUS
INTRAVENOUS | Status: DC | PRN
  Administered 2015-02-07: 120 mg via INTRAVENOUS

## 2015-02-07 MED ORDER — OXYBUTYNIN CHLORIDE ER 15 MG PO TB24
15.0000 mg | ORAL_TABLET | Freq: Every day | ORAL | Status: DC
  Administered 2015-02-08: 15 mg via ORAL
  Filled 2015-02-07 (×2): qty 1

## 2015-02-07 MED ORDER — ROCURONIUM BROMIDE 50 MG/5ML IV SOLN
INTRAVENOUS | Status: AC
Start: 2015-02-07 — End: 2015-02-07
  Filled 2015-02-07: qty 3

## 2015-02-07 MED ORDER — LIDOCAINE-EPINEPHRINE 1 %-1:100000 IJ SOLN
INTRAMUSCULAR | Status: DC | PRN
  Administered 2015-02-07: 15 mL

## 2015-02-07 MED ORDER — SENNA 8.6 MG PO TABS
1.0000 | ORAL_TABLET | Freq: Two times a day (BID) | ORAL | Status: DC
  Administered 2015-02-07 – 2015-02-13 (×12): 8.6 mg via ORAL
  Filled 2015-02-07 (×11): qty 1

## 2015-02-07 MED ORDER — NEOSTIGMINE METHYLSULFATE 10 MG/10ML IV SOLN
INTRAVENOUS | Status: AC
  Filled 2015-02-07: qty 1

## 2015-02-07 MED ORDER — BUPIVACAINE LIPOSOME 1.3 % IJ SUSP
INTRAMUSCULAR | Status: DC | PRN
  Administered 2015-02-07: 20 mL

## 2015-02-07 MED ORDER — OXYCODONE-ACETAMINOPHEN 10-325 MG PO TABS: 1.0000 | ORAL_TABLET | ORAL | Status: DC | PRN

## 2015-02-07 MED ORDER — GABAPENTIN 300 MG PO CAPS
600.0000 mg | ORAL_CAPSULE | Freq: Three times a day (TID) | ORAL | Status: DC
  Administered 2015-02-07 – 2015-02-13 (×18): 600 mg via ORAL
  Filled 2015-02-07 (×18): qty 2

## 2015-02-07 MED ORDER — ACETAMINOPHEN 325 MG PO TABS
650.0000 mg | ORAL_TABLET | ORAL | Status: DC | PRN
  Administered 2015-02-10: 650 mg via ORAL
  Filled 2015-02-07: qty 2

## 2015-02-07 MED ORDER — FENOFIBRATE 160 MG PO TABS
160.0000 mg | ORAL_TABLET | Freq: Every day | ORAL | Status: DC
Start: 2015-02-07 — End: 2015-02-13
  Administered 2015-02-07 – 2015-02-13 (×7): 160 mg via ORAL
  Filled 2015-02-07 (×7): qty 1

## 2015-02-07 MED ORDER — PHENOL 1.4 % MT LIQD: 1.0000 | OROMUCOSAL | Status: DC | PRN

## 2015-02-07 MED ORDER — BISACODYL 5 MG PO TBEC: 5.0000 mg | DELAYED_RELEASE_TABLET | Freq: Every day | ORAL | Status: DC | PRN

## 2015-02-07 MED ORDER — ESMOLOL HCL 100 MG/10ML IV SOLN
INTRAVENOUS | Status: AC
  Filled 2015-02-07: qty 10

## 2015-02-07 MED ORDER — LACTATED RINGERS IV SOLN: INTRAVENOUS | Status: DC

## 2015-02-07 MED ORDER — MENTHOL 3 MG MT LOZG: 1.0000 | LOZENGE | OROMUCOSAL | Status: DC | PRN

## 2015-02-07 MED ORDER — 0.9 % SODIUM CHLORIDE (POUR BTL) OPTIME
TOPICAL | Status: DC | PRN
  Administered 2015-02-07: 1000 mL

## 2015-02-07 MED ORDER — PHENYLEPHRINE HCL 10 MG/ML IJ SOLN
INTRAMUSCULAR | Status: AC
Start: 2015-02-07 — End: 2015-02-07
  Filled 2015-02-07: qty 1

## 2015-02-07 MED ORDER — HYDROMORPHONE HCL 1 MG/ML IJ SOLN
0.5000 mg | INTRAMUSCULAR | Status: DC | PRN
  Administered 2015-02-07 – 2015-02-11 (×10): 1 mg via INTRAVENOUS
  Filled 2015-02-07 (×10): qty 1

## 2015-02-07 MED ORDER — CLONAZEPAM 1 MG PO TABS
1.0000 mg | ORAL_TABLET | Freq: Every day | ORAL | Status: DC
  Administered 2015-02-07 – 2015-02-12 (×6): 1 mg via ORAL
  Filled 2015-02-07 (×6): qty 1

## 2015-02-07 SURGICAL SUPPLY — 63 items
BENZOIN TINCTURE PRP APPL 2/3 (GAUZE/BANDAGES/DRESSINGS) IMPLANT
BIT DRILL NEURO 2X3.1 SFT TUCH (MISCELLANEOUS) ×1 IMPLANT
BLADE CLIPPER SURG (BLADE) ×2 IMPLANT
BLADE SURG 11 STRL SS (BLADE) ×2 IMPLANT
BUR ROUND FLUTED 5 RND (BURR) ×2 IMPLANT
CANISTER SUCT 3000ML PPV (MISCELLANEOUS) ×2 IMPLANT
CHLORAPREP W/TINT 26ML (MISCELLANEOUS) ×2 IMPLANT
DECANTER SPIKE VIAL GLASS SM (MISCELLANEOUS) ×2 IMPLANT
DERMABOND ADVANCED (GAUZE/BANDAGES/DRESSINGS) ×1
DERMABOND ADVANCED .7 DNX12 (GAUZE/BANDAGES/DRESSINGS) ×1 IMPLANT
DRAPE MICROSCOPE LEICA (MISCELLANEOUS) ×2 IMPLANT
DRAPE POUCH INSTRU U-SHP 10X18 (DRAPES) ×2 IMPLANT
DRAPE SURG 17X23 STRL (DRAPES) ×2 IMPLANT
DRILL NEURO 2X3.1 SOFT TOUCH (MISCELLANEOUS) ×2
DRSG OPSITE 4X5.5 SM (GAUZE/BANDAGES/DRESSINGS) ×2 IMPLANT
DRSG OPSITE POSTOP 4X6 (GAUZE/BANDAGES/DRESSINGS) IMPLANT
DRSG OPSITE POSTOP 4X8 (GAUZE/BANDAGES/DRESSINGS) ×2 IMPLANT
ELECT REM PT RETURN 9FT ADLT (ELECTROSURGICAL) ×2
ELECTRODE REM PT RTRN 9FT ADLT (ELECTROSURGICAL) ×1 IMPLANT
EVACUATOR 1/8 PVC DRAIN (DRAIN) ×2 IMPLANT
GAUZE SPONGE 4X4 12PLY STRL (GAUZE/BANDAGES/DRESSINGS) IMPLANT
GAUZE SPONGE 4X4 16PLY XRAY LF (GAUZE/BANDAGES/DRESSINGS) ×2 IMPLANT
GLOVE BIOGEL PI IND STRL 7.0 (GLOVE) ×4 IMPLANT
GLOVE BIOGEL PI IND STRL 7.5 (GLOVE) ×2 IMPLANT
GLOVE BIOGEL PI IND STRL 8.5 (GLOVE) ×1 IMPLANT
GLOVE BIOGEL PI INDICATOR 7.0 (GLOVE) ×4
GLOVE BIOGEL PI INDICATOR 7.5 (GLOVE) ×2
GLOVE BIOGEL PI INDICATOR 8.5 (GLOVE) ×1
GLOVE ECLIPSE 8.5 STRL (GLOVE) ×2 IMPLANT
GLOVE EXAM NITRILE LRG STRL (GLOVE) IMPLANT
GLOVE EXAM NITRILE MD LF STRL (GLOVE) IMPLANT
GLOVE EXAM NITRILE XL STR (GLOVE) IMPLANT
GLOVE EXAM NITRILE XS STR PU (GLOVE) IMPLANT
GLOVE SS BIOGEL STRL SZ 7 (GLOVE) ×4 IMPLANT
GLOVE SUPERSENSE BIOGEL SZ 7 (GLOVE) ×4
GOWN SPEC L3 XXLG W/TWL (GOWN DISPOSABLE) ×2 IMPLANT
GOWN STRL REUS W/ TWL LRG LVL3 (GOWN DISPOSABLE) ×3 IMPLANT
GOWN STRL REUS W/ TWL XL LVL3 (GOWN DISPOSABLE) ×1 IMPLANT
GOWN STRL REUS W/TWL LRG LVL3 (GOWN DISPOSABLE) ×3
GOWN STRL REUS W/TWL XL LVL3 (GOWN DISPOSABLE) ×1
HEMOSTAT POWDER KIT SURGIFOAM (HEMOSTASIS) ×4 IMPLANT
KIT BASIN OR (CUSTOM PROCEDURE TRAY) ×2 IMPLANT
KIT ROOM TURNOVER OR (KITS) ×2 IMPLANT
NEEDLE HYPO 21X1.5 SAFETY (NEEDLE) ×4 IMPLANT
NEEDLE HYPO 25X1 1.5 SAFETY (NEEDLE) IMPLANT
NS IRRIG 1000ML POUR BTL (IV SOLUTION) ×2 IMPLANT
PACK LAMINECTOMY NEURO (CUSTOM PROCEDURE TRAY) ×2 IMPLANT
PACK UNIVERSAL I (CUSTOM PROCEDURE TRAY) ×2 IMPLANT
PAD ARMBOARD 7.5X6 YLW CONV (MISCELLANEOUS) ×6 IMPLANT
PATTIES SURGICAL .5X1.5 (GAUZE/BANDAGES/DRESSINGS) ×2 IMPLANT
RUBBERBAND STERILE (MISCELLANEOUS) ×4 IMPLANT
SPONGE SURGIFOAM ABS GEL 100 (HEMOSTASIS) ×4 IMPLANT
SPONGE SURGIFOAM ABS GEL SZ50 (HEMOSTASIS) IMPLANT
STRIP CLOSURE SKIN 1/2X4 (GAUZE/BANDAGES/DRESSINGS) IMPLANT
SUT VIC AB 0 CT1 18XCR BRD8 (SUTURE) ×2 IMPLANT
SUT VIC AB 0 CT1 8-18 (SUTURE) ×2
SUT VIC AB 2-0 CT1 18 (SUTURE) ×4 IMPLANT
SUT VIC AB 3-0 SH 8-18 (SUTURE) ×4 IMPLANT
SUT VIC AB 4-0 PS2 27 (SUTURE) ×2 IMPLANT
TOWEL OR 17X24 6PK STRL BLUE (TOWEL DISPOSABLE) ×2 IMPLANT
TOWEL OR 17X26 10 PK STRL BLUE (TOWEL DISPOSABLE) ×2 IMPLANT
TUBE CONNECTING 12X1/4 (SUCTIONS) IMPLANT
WATER STERILE IRR 1000ML POUR (IV SOLUTION) ×2 IMPLANT

## 2015-02-07 NOTE — Anesthesia Procedure Notes (Signed)
Procedure Name: Intubation Date/Time: 02/07/2015 12:37 PM Performed by: Layla Maw Pre-anesthesia Checklist: Patient identified, Patient being monitored, Timeout performed, Emergency Drugs available and Suction available Patient Re-evaluated:Patient Re-evaluated prior to inductionOxygen Delivery Method: Circle System Utilized Preoxygenation: Pre-oxygenation with 100% oxygen Intubation Type: IV induction Ventilation: Mask ventilation without difficulty Laryngoscope Size: Miller and 3 Grade View: Grade I Tube type: Oral Tube size: 7.5 mm Number of attempts: 1 Airway Equipment and Method: Stylet Placement Confirmation: ETT inserted through vocal cords under direct vision,  positive ETCO2 and breath sounds checked- equal and bilateral Secured at: 21 cm Tube secured with: Tape Dental Injury: Teeth and Oropharynx as per pre-operative assessment

## 2015-02-07 NOTE — Anesthesia Postprocedure Evaluation (Signed)
Anesthesia Post Note  Patient: Gary Yates.  Procedure(s) Performed: Procedure(s) (LRB): Lumbar three-Sacral one Decompression (N/A)  Patient location during evaluation: PACU Anesthesia Type: General Level of consciousness: awake and alert Pain management: pain level controlled Vital Signs Assessment: post-procedure vital signs reviewed and stable Respiratory status: spontaneous breathing, nonlabored ventilation, respiratory function stable and patient connected to nasal cannula oxygen Cardiovascular status: blood pressure returned to baseline and stable Postop Assessment: no signs of nausea or vomiting Anesthetic complications: no    Last Vitals:  Filed Vitals:   02/07/15 1615 02/07/15 1622  BP: 109/72   Pulse: 75 77  Temp:  36.6 C  Resp: 19 16    Last Pain:  Filed Vitals:   02/07/15 1622  PainSc: Asleep                 Aaron Boeh L

## 2015-02-07 NOTE — H&P (Signed)
History of Present Illness: 1.  back pain  I saw Gary Yates in clinic today.  He is a 82 year old man with 3 months of low back and leg pain.  In the past he has undergone lumbar decompression surgery (1986-87).  The pain he complains of radiates from his low back down the back of his legs.  His left leg hurts more than the right.  The pain is worst when standing up straight.  He has to walk with a walker so that he can lean forward and be more comfortable.  He has taken percocet.  Physical therapy has not been helpful for him.  He denies weakness or numbness.  He is on multiple blood thinners.       PAST MEDICAL HISTORY, SURGICAL HISTORY, FAMILY HISTORY, SOCIAL HISTORY AND REVIEW OF SYSTEMS I have reviewed the patient's past medical, surgical, family and social history as well as the comprehensive review of systems as included on the [facility] history form dated 01/20/2015, which I have signed.  FAMILY HISTORY Patient reports there is no relevant family history.    MEDICATIONS(added, continued or stopped this visit): Started Medication Directions Instruction Stopped   Accu-Chek Aviva Plus test strips      Calcium-D3  BUCCAL      clonazepam 1 mg tablet take 1 tablet by oral route  every day     clopidogrel 75 mg tablet take 1 tablet by oral route  every day     clotrimazole 1 % topical cream apply by topical route 2 times every day to the affected and surrounding areas of skin in the morning and evening     fenofibrate micronized 134 mg capsule take 1 capsule by oral route  every day with food     gabapentin 300 mg capsule take 1 capsule by oral route 3 times every day     Humalog KwikPen 100 unit/mL subcutaneous inject by subcutaneous route per prescriber's instructions. Insulin dosing requires individualization.     metformin 500 mg tablet take 1 tablet by oral route 2 times every day with morning and evening meals     metoprolol succinate ER 100 mg  tablet,extended release 24 hr take 1 tablet by oral route  every day     multivitamin tablet      Myrbetriq 50 mg tablet,extended release take 1 tablet by oral route  every day swallowing whole with water. Do not crush, chew and/or divide.     nitroglycerin 0.4 mg sublingual tablet place 1 tablet by sublingual route at 1st sign of attack; may repeat every 5 minutes up to 3 tabs; if norelief seek medical help     Ortho-Tabs 500 mg calcium-400 unit-15 mcg tablet      oxybutynin chloride ER 15 mg tablet,extended release 24 hr take 1 tablet by oral route  every day     oxymetazoline 0.05 % nasal spray spray 2 spray by intranasal route 2 times every day in each nostril in the morning and evening     pantoprazole 40 mg tablet,delayed release take 1 tablet by oral route  every day     pramipexole 1.5 mg tablet take 1 tablet by oral route 3 times every day     tamsulosin 0.4 mg capsule take 1 capsule by oral route  every day 1/2 hour following the same meal each day     Toujeo SoloStar 300 unit/mL (1.5 mL) subcutaneous insulin pen inject by subcutaneous route as per insulin  protocol     Xarelto 20 mg tablet take 1 tablet by oral route  every day with the evening meal       ALLERGIES: Ingredient Reaction Medication Name Comment  PENICILLIN     ACE INHIBITORS     CODEINE      Reviewed, updated.   REVIEW OF SYSTEMS System Neg/Pos Details  Constitutional Negative Chills, fatigue, fever, malaise, night sweats, weight gain and weight loss.  ENMT Positive Ringing in ears.  ENMT Negative Ear drainage, hearing loss, nasal drainage, otalgia, sinus pressure and sore throat.  Eyes Negative Eye discharge, eye pain and vision changes.  Respiratory Positive Dyspnea.  Respiratory Negative Chronic cough, cough, known TB exposure and wheezing.  Cardio Negative Chest pain, claudication, edema and irregular heartbeat/palpitations.  GI Negative Abdominal pain, blood in stool, change in stool pattern,  constipation, decreased appetite, diarrhea, heartburn, nausea and vomiting.  GU Negative Dribbling, dysuria, erectile dysfunction, hematuria, polyuria, slow stream, urinary frequency, urinary incontinence and urinary retention.  Endocrine Negative Cold intolerance, heat intolerance, polydipsia and polyphagia.  Neuro Positive Gait disturbance.  Neuro Negative Dizziness, extremity weakness, headache, memory impairment, numbness in extremity, seizures and tremors.  Psych Negative Anxiety, depression and insomnia.  Integumentary Negative Brittle hair, brittle nails, change in shape/size of mole(s), hair loss, hirsutism, hives, pruritus, rash and skin lesion.  MS Negative Back pain, joint pain, joint swelling, muscle weakness and neck pain.  Hema/Lymph Negative Easy bleeding, easy bruising and lymphadenopathy.  Allergic/Immuno Negative Contact allergy, environmental allergies, food allergies and seasonal allergies.  Reproductive Negative Penile discharge and sexual dysfunction.     Vitals Date Temp F BP Pulse Ht In Wt Lb BMI BSA Pain Score  01/20/2015  126/72 70 70 254 36.44  8/10     PHYSICAL EXAM General Level of Distress: no acute distress Overall Appearance: normal    Musculoskeletal Gait and Station: normal  Right Left Lower Extremity Muscle Strength: normal normal Lower Extremity Muscle Tone: normal normal  Motor Strength Lower extremity motor strength was tested in the clinically pertinent muscles.     Deep Tendon Reflexes  Right Left Patellar: normal normal Achilles: normal normal  Sensory Sensation was tested at L1 to S1.   Motor and other Tests    Right Left Clonus: normal normal     DIAGNOSTIC RESULTS I have independently reviewed a noncontrasted lumbar MR study from 01/07/15.  He has normal alignment and lordosis.  He has a laminotomy defect at L4-5 on the right.  He has multilevel spondylotic changes.  He has lateral recess and central stenosis from  L3-S1.  At L2-3 he has a minimal amount of lateral recess stenosis.    IMPRESSION Gary Yates is a 82 year old man with neurogenic claudication from lumbar stenosis.  He is neurologically intact.  He has profound stenosis from L3-S1.  I have recommended laminectomy for decompression fro L3-S1.  We had a long discussion about this risks and benefits and alternatives to surgery and he wishes to proceed. He will have to be cleared to stop his anti-platelet and anti-coagulant agents for one week prior to surgery.  Completed Orders (this encounter) Order Details Reason Side Interpretation Result Initial Treatment Date Region  Dietary management education, guidance, and counseling Encouraged to eat a well balanced diet and follow up with primary care physician.         Assessment/Plan # Detail Type Description   1. Assessment Neurogenic claudication (M48.06).       2. Assessment Body mass  index (BMI) 36.0-36.9, adult (Z68.36).   Plan Orders Today's instructions / counseling include(s) Dietary management education, guidance, and counseling.         Pain Assessment/Treatment Pain Scale: 8/10. Method: Numeric Pain Intensity Scale. Location: back. Duration: varies. Quality: discomforting. Pain Assessment/Treatment follow-up plan of care: Patient is taking over the counter pain relievers for pain..  Fall Risk Plan The patient has not fallen in the last year.  L3-S1 laminectomy for decompression

## 2015-02-07 NOTE — Progress Notes (Signed)
Pt transported per Dora,NT

## 2015-02-07 NOTE — Op Note (Signed)
02/07/2015  3:40 PM  PATIENT:  Gary Yates.  82 y.o. male  PRE-OPERATIVE DIAGNOSIS:  Spondylosis with radiculopathy, lumbar stenosis L3-4, L4-5, L5-S1, prior hemilaminotomy right L4-5.  POST-OPERATIVE DIAGNOSIS:  Same  PROCEDURE:  Reoperative laminectomy right L4-5 for decompression of L4 and L5 nerve roots, bilateral laminectomy L3-4 and L5-S1 and hemilaminectomy left L4-5, for decompression of bilateral L3, L4, L5, and S1 nerve roots  SURGEON:  Aldean Ast, MD  ASSISTANTS: Kristeen Miss, MD  ANESTHESIA:   General  DRAINS: Medium Hemovac  SPECIMEN:  None  INDICATION FOR PROCEDURE: 82 year old man with medically refractory back and leg pain. He has an MRI which shows profound stenosis L3-4, L4-5, L5-S1. He has a prior hemilaminotomy at L4-5 on the right. Patient understood the risks, benefits, and alternatives and potential outcomes and wished to proceed.  PROCEDURE DETAILS: After smooth induction of general endotracheal anesthesia the patient was turned prone on a Wilson frame. The skin was clipped of hair and wiped out with alcohol. The skin was then injected with a mixture of lidocaine and Marcaine with epinephrine. The surgical site was prepped and draped in the usual sterile fashion.  His existing scar was excised and the incision was extended superiorly and inferior in the midline. Monopolar cautery was used to dissect the subcutaneous tissues to the tips of the spinous processes from the bottom of L2 to the top of the sacrum. Subperiosteal dissection was carried out over the spinous processes, to the lateral aspects of the lamina, with care taken not to devascularize the facet joints.  Using Leksell and Kerrison rongeurs the spinous processes and lamina were removed from above the L3 pedicle to the top of the sacrum. Using Kerrison rongeurs. Hypertrophied ligament and facets were removed to decompress the bilateral L3 nerve roots, the left L4 and L5 and bilateral S1 nerve  roots. There was significant tethering of the dura to the cut bone edge at L4-5 on the right. This was released with curettes and sharp dissection. I was then able to further decompress the right L4 and L5 nerve roots.  I obtained hemostasis. I passed a medium Hemovac drain below the fascia. I placed approximately 750 mg of vancomycin powder below the fascia. I closed the wound using interrupted Vicryl sutures and approximating the usual anatomic structures. I injected Exparel after closing the fascia and placed remainder of the 1 g of vancomycin and the subcutaneous tissues. The skin was closed with a running Vicryl suture and sealed with Dermabond.  PATIENT DISPOSITION: PACU then floor   Delay start of Pharmacological VTE agent (>24hrs) due to surgical blood loss or risk of bleeding: Yes

## 2015-02-07 NOTE — Progress Notes (Signed)
Awake, alert Moving lower extremities well Stable Transfer to floor

## 2015-02-07 NOTE — Transfer of Care (Signed)
Immediate Anesthesia Transfer of Care Note  Patient: Gary Yates.  Procedure(s) Performed: Procedure(s) with comments: Lumbar three-Sacral one Decompression (N/A) - L3 to S1 Decompression  Patient Location: PACU  Anesthesia Type:General  Level of Consciousness: awake, alert , oriented and patient cooperative  Airway & Oxygen Therapy: Patient Spontanous Breathing and Patient connected to nasal cannula oxygen  Post-op Assessment: Report given to RN and Post -op Vital signs reviewed and stable  Post vital signs: Reviewed and stable  Last Vitals:  Filed Vitals:   02/07/15 1011 02/07/15 1012  BP:  119/52  Pulse: 68   Temp: 36.6 C   Resp: 18     Complications: No apparent anesthesia complications

## 2015-02-07 NOTE — Progress Notes (Signed)
Off plavix for a week, xarelto for 4 days Unchanged symptoms Normal strength Ready for OR

## 2015-02-07 NOTE — Progress Notes (Signed)
Patient arrived to 5M17. Patient is alert, oriented x3, appropriate cognition, equal grips in bilateral upper extremities, weak dorsi and plantar flexion in bilateral lower extremities, denies numbness or tingling. States pain is "5/10" in lower back at incision site, "it feels like I am laying on a softball." No medical equipment on patient's back.  Honey comb dressing on back in clean, dry, intact, no drainage. Hemovac dressing site is intact. Patient oriented to room, unit, staff, plan of care. Safety measures in place including bed alarm. Will continue to monitor closely.

## 2015-02-07 NOTE — Anesthesia Preprocedure Evaluation (Addendum)
Anesthesia Evaluation  Patient identified by MRN, date of birth, ID band Patient awake    Reviewed: Allergy & Precautions, H&P , NPO status , Patient's Chart, lab work & pertinent test results, reviewed documented beta blocker date and time   Airway Mallampati: II  TM Distance: >3 FB Neck ROM: full    Dental  (+) Dental Advisory Given, Edentulous Upper, Edentulous Lower   Pulmonary shortness of breath and with exertion, COPD, former smoker,    Pulmonary exam normal breath sounds clear to auscultation       Cardiovascular hypertension, Pt. on home beta blockers + CAD, + Cardiac Stents and + Peripheral Vascular Disease  Normal cardiovascular exam+ dysrhythmias Supra Ventricular Tachycardia  Rhythm:regular Rate:Normal     Neuro/Psych Peripheral neuropathy  Neuromuscular disease negative neurological ROS  negative psych ROS   GI/Hepatic negative GI ROS, Neg liver ROS, GERD  Medicated and Controlled,  Endo/Other  diabetes, Well Controlled, Type 2, Insulin Dependent, Oral Hypoglycemic Agents  Renal/GU Renal diseaseStage 3 chronic kidney disease  negative genitourinary   Musculoskeletal   Abdominal   Peds  Hematology negative hematology ROS (+)   Anesthesia Other Findings   Reproductive/Obstetrics negative OB ROS                            Anesthesia Physical Anesthesia Plan  ASA: III  Anesthesia Plan: General   Post-op Pain Management:    Induction: Intravenous  Airway Management Planned: Oral ETT  Additional Equipment:   Intra-op Plan:   Post-operative Plan: Extubation in OR  Informed Consent: I have reviewed the patients History and Physical, chart, labs and discussed the procedure including the risks, benefits and alternatives for the proposed anesthesia with the patient or authorized representative who has indicated his/her understanding and acceptance.   Dental Advisory  Given  Plan Discussed with: CRNA and Surgeon  Anesthesia Plan Comments:         Anesthesia Quick Evaluation

## 2015-02-08 ENCOUNTER — Encounter (HOSPITAL_COMMUNITY): Payer: Self-pay | Admitting: Neurological Surgery

## 2015-02-08 LAB — GLUCOSE, CAPILLARY
Glucose-Capillary: 78 mg/dL (ref 65–99)
Glucose-Capillary: 122 mg/dL — ABNORMAL HIGH (ref 65–99)
Glucose-Capillary: 118 mg/dL — ABNORMAL HIGH (ref 65–99)

## 2015-02-08 NOTE — Evaluation (Signed)
Occupational Therapy Evaluation Patient Details Name: Gary Yates. MRN: [id number] DOB: 1933-06-10 Today's Date: 02/08/2015    History of Present Illness Pt is a 82 y.o. male s/p Reoperative laminectomy right L4-5 for decompression of L4 and L5 nerve roots, bilateral laminectomy L3-4 and L5-S1 and hemilaminectomy left L4-5, for decompression of bilateral L3, L4, L5, and S1 nerve roots.    Clinical Impression   Pt awake and alert at start of session with increased lethargy/fatigue during end of session; difficulty staying awake during education at end of session. Pt reports he was independent with ADLs PTA. Currently pt is overall min assist for functional mobility and ADLs. Began safety, back, and ADL education with pt and son. [name] with 24/7 supervision from his wife who is able to provide limited physical assist secondary to recent back sx x2. Pt would benefit from continued skilled OT to address established goals.      Follow Up Recommendations  No OT follow up;Supervision/Assistance - 24 hour    Equipment Recommendations  Other (comment) (AE)    Recommendations for Other Services PT consult     Precautions / Restrictions Precautions Precautions: Back;Fall Precaution Booklet Issued: Yes (comment) Precaution Comments: Educated pt and son on back precautions. Pt able to recall 1/3 at end of session. Restrictions Weight Bearing Restrictions: No      Mobility Bed Mobility               General bed mobility comments: Pt sitting EOB upon arrival.  Transfers Overall transfer level: Needs assistance Equipment used: Rolling walker (2 wheeled) Transfers: Sit to/from Stand Sit to Stand: Min assist;From elevated surface         General transfer comment: Attempted sit to stand from EOB in lowered position, unsuccessful. With bed elevated pt required min assist to boost up from EOB. VCs for hand placement and technique.    Balance Overall balance  assessment: Needs assistance Sitting-balance support: Feet supported;Single extremity supported Sitting balance-Leahy Scale: Fair     Standing balance support: Bilateral upper extremity supported Standing balance-Leahy Scale: Poor Standing balance comment: RW for support                            ADL Overall ADL's : Needs assistance/impaired Eating/Feeding: Set up;Sitting   Grooming: Min guard;Standing   Upper Body Bathing: Min guard;Sitting   Lower Body Bathing: Minimal assistance;Sit to/from stand;Cueing for back precautions   Upper Body Dressing : Min guard;Sitting   Lower Body Dressing: Minimal assistance;Sit to/from stand;Cueing for back precautions Lower Body Dressing Details (indicate cue type and reason): Pt unable to cross one foot over opposte knee at this time. Pt reports this is how he was managing sock/shoes before hand but would pull on pant leg to assist with bringing foot up. Pt reports wife can assist as needed upon return home. Educated on compensatory strategies.  Toilet Transfer: Minimal assistance;Ambulation;BSC;RW (BSC over toilet) Toilet Transfer Details (indicate cue type and reason): Simulated by transfer from EOB to chair. Educated on use of 3 in 1 over toilet. Toileting- Clothing Manipulation and Hygiene: Minimal assistance;Sit to/from stand       Functional mobility during ADLs: Min guard;Rolling walker General ADL Comments: Pts son present for OT eval. Educated on cup for oral care, placing items at counter top height, maintaining back precautions during functional activities; pt verbalized understanding. Pt awake and alert at start of session with increased lethargy toward end  of session, pt falling asleep during education at end of session.      Vision     Perception     Praxis      Pertinent Vitals/Pain Pain Assessment: 0-10 Pain Score: 5  Pain Location: back, incision Pain Descriptors / Indicators: Sore Pain Intervention(s):  Limited activity within patient's tolerance;Monitored during session;Repositioned     Hand Dominance     Extremity/Trunk Assessment Upper Extremity Assessment Upper Extremity Assessment: Overall WFL for tasks assessed   Lower Extremity Assessment Lower Extremity Assessment: Defer to PT evaluation   Cervical / Trunk Assessment Cervical / Trunk Assessment: Other exceptions Cervical / Trunk Exceptions: pt is s/p lumbar sx   Communication Communication Communication: Expressive difficulties   Cognition Arousal/Alertness: Lethargic Behavior During Therapy: WFL for tasks assessed/performed Overall Cognitive Status: Impaired/Different from baseline Area of Impairment: Following commands     Memory: Decreased recall of precautions Following Commands: Follows one step commands inconsistently;Follows one step commands with increased time       General Comments: Pt alert at start of session, increased lethargy as session went on and began to fall asleep with education at end of session.   General Comments       Exercises       Shoulder Instructions      Home Living Family/patient expects to be discharged to:: Private residence Living Arrangements: Spouse/significant other Available Help at Discharge: Family;Available 24 hours/day (wife s/p back sx x2 in the past few months) Type of Home: House Home Access: Stairs to enter [employer] of Steps: 1   Home Layout: Two level;Bed/bath upstairs Alternate Level Stairs-Number of Steps: spiral staircase   Bathroom Shower/Tub: Tub/shower unit Shower/tub characteristics: Architectural technologist: Standard Bathroom Accessibility: Yes How Accessible: Accessible via walker Home Equipment: [place] - 4 wheels;Cane - single point;Bedside commode;Shower seat;Hand held shower head          Prior Functioning/Environment Level of Independence: Independent with assistive device(s)        Comments: Pt was using cane for  mobillity, rollator over the past ~10 weeks secondary to increased pain,    OT Diagnosis: Generalized weakness;Acute pain   OT Problem List: Decreased strength;Decreased activity tolerance;Impaired balance (sitting and/or standing);Decreased safety awareness;Decreased knowledge of use of DME or AE;Decreased knowledge of precautions;Pain   OT Treatment/Interventions: Self-care/ADL training;Energy conservation;DME and/or AE instruction;Therapeutic activities;Patient/family education;Balance training    OT Goals(Current goals can be found in the care plan section) Acute Rehab OT Goals Patient Stated Goal: return home OT Goal Formulation: With patient Time For Goal Achievement: 02/22/15 Potential to Achieve Goals: Good ADL Goals Pt Will Perform Grooming: with supervision;standing Pt Will Perform Lower Body Bathing: with supervision;sit to/from stand (with or without AE) Pt Will Perform Lower Body Dressing: with supervision;sit to/from stand (with or without AE) Pt Will Transfer to Toilet: with supervision;ambulating;bedside commode (over toilet) Pt Will Perform Toileting - Clothing Manipulation and hygiene: with supervision;sit to/from stand (with or without AE) Additional ADL Goal #1: Pt verbally recall 3/3 back precautions and independently maintain throughout ADL activity.  OT Frequency: Min 2X/week   Barriers to D/C: Inaccessible home environment;Decreased caregiver support  Bed and bath upstairs. Wife available to supervise but s/p back sx x2 recently.       Co-evaluation              End of Session Equipment Utilized During Treatment: Gait belt;Rolling walker Nurse Communication: Mobility status (RN present for transfer)  Activity Tolerance: Patient tolerated treatment well Patient left: in  chair;with call bell/phone within reach;with family/visitor present   Time: [id number] OT Time Calculation (min): 23 min Charges:  OT General Charges $OT Visit: 1 Procedure OT  Evaluation $OT Eval Moderate Complexity: 1 Procedure OT Treatments $Self Care/Home Management : 8-22 mins G-Codes:     Binnie Kand M.S., OTR/L Pager: [phone]  02/08/2015, 8:36 AM

## 2015-02-08 NOTE — Progress Notes (Signed)
Patient walked 100 ft with rolling walker this morning and tolerated it very well. Gary Yates, [place] 02/08/2015 5:54 AM

## 2015-02-08 NOTE — Evaluation (Signed)
Physical Therapy Evaluation Patient Details Name: Gary Yates. MRN: [id number] DOB: 1933-04-30 Today's Date: 02/08/2015   History of Present Illness  Pt is a 82 y.o. male s/p Reoperative laminectomy right L4-5 for decompression of L4 and L5 nerve roots, bilateral laminectomy L3-4 and L5-S1 and hemilaminectomy left L4-5, for decompression of bilateral L3, L4, L5, and S1 nerve roots. PMHx- CAD, DM with neuropathy, restless legs  Clinical Impression  Patient is s/p above surgery resulting in the deficits listed below (see PT Problem List). Patient very drowsy and with poor recall of education provided earlier today by OT. Patient will benefit from skilled PT to increase their independence and safety with mobility (while adhering to their precautions) to allow discharge to the venue listed below.  Patient's wife can only provide supervision due to history of 2 back surgeries. If slow progress( or cognition continues to be significantly impaired), patient may need post-acute inpatient therapies prior to home.     Follow Up Recommendations Home health PT (however if slow progress, may need post-acute inpt therapies); Supervision for mobility/OOB    Equipment Recommendations  None recommended by PT    Recommendations for Other Services       Precautions / Restrictions Precautions Precautions: Back;Fall Precaution Comments: Patient able to recall 2/3 (not twisting); wife educated and shown handout; required vc to adhere with mobility Restrictions Weight Bearing Restrictions: No      Mobility  Bed Mobility Overal bed mobility: Needs Assistance Bed Mobility: Rolling;Sidelying to Sit Rolling: Min assist Sidelying to sit: Mod assist;HOB elevated       General bed mobility comments: with rail and multiple cues for technique  Transfers Overall transfer level: Needs assistance Equipment used: Rolling walker (2 wheeled) Transfers: Sit to/from Stand Sit to Stand: Mod assist          General transfer comment: x2 (including standard height toilet); pt required repeated attempts to achieve full standing  Ambulation/Gait Ambulation/Gait assistance: Min assist Ambulation Distance (Feet): 80 Feet Assistive device: Rolling walker (2 wheeled) Gait Pattern/deviations: Step-through pattern;Decreased stride length;Trunk flexed   Gait velocity interpretation: Below normal speed for age/gender General Gait Details: educated on use of RW and repeated cues for upright posture  Stairs            Wheelchair Mobility    Modified Rankin (Stroke Patients Only)       Balance     Sitting balance-Leahy Scale: Fair       Standing balance-Leahy Scale: Poor                               Pertinent Vitals/Pain Pain Assessment: 0-10 Pain Score: 6  Pain Location: back Pain Descriptors / Indicators: Aching;Operative site guarding Pain Intervention(s): Limited activity within patient's tolerance;Monitored during session;Repositioned;Patient requesting pain meds-RN notified;RN gave pain meds during session    [place] expects to be discharged to:: Private residence Living Arrangements: Spouse/significant other Available Help at Discharge: Family;Available 24 hours/day (wife s/p back sx x2 in the past few months) Type of Home: House Home Access: Stairs to enter Entrance Stairs-Rails: None Entrance Stairs-Number of Steps: 1 Home Layout: Two level;Bed/bath upstairs;Laundry or work area in basement (come in on main level with bedroom/bathroom) Home Equipment: Environmental consultant - 4 wheels;Cane - single point;Bedside commode;Shower seat;Hand held Tourist information centre manager - 2 wheels      Prior Function Level of Independence: Independent with assistive device(s)  Comments: Pt was using cane for mobillity, rollator over the past ~10 weeks secondary to increased pain,     Hand Dominance        Extremity/Trunk Assessment   Upper Extremity  Assessment: Defer to OT evaluation           Lower Extremity Assessment: RLE deficits/detail;LLE deficits/detail;Generalized weakness      Cervical / Trunk Assessment: Other exceptions  Communication   Communication: Expressive difficulties (some non-sensical statements; very groggy)  Cognition Arousal/Alertness: Lethargic;Suspect due to medications Behavior During Therapy: [facility] for tasks assessed/performed Overall Cognitive Status: Impaired/Different from baseline Area of Impairment: Following commands;Attention;Memory;Awareness   Current Attention Level: Selective Memory: Decreased recall of precautions;Decreased short-term memory Following Commands: Follows one step commands with increased time   Awareness: Intellectual   General Comments: lethargic in supine, improved while up on his feet, again drowsy when up in chair preparing for lunch    General Comments General comments (skin integrity, edema, etc.): Wife present. Very concerned that she cannot manage pt at home in current condition (due to her back surgeries)    Exercises        Assessment/Plan    PT Assessment Patient needs continued PT services  PT Diagnosis Difficulty walking;Acute pain   PT Problem List Decreased balance;Decreased mobility;Decreased cognition;Decreased knowledge of use of DME;Decreased safety awareness;Decreased knowledge of precautions;Impaired sensation;Obesity;Pain  PT Treatment Interventions DME instruction;Gait training;Stair training;Functional mobility training;Therapeutic activities;Patient/family education;Cognitive remediation   PT Goals (Current goals can be found in the Care Plan section) Acute Rehab PT Goals Patient Stated Goal: return home PT Goal Formulation: With patient/family Time For Goal Achievement: 02/15/15 Potential to Achieve Goals: Good    Frequency Min 5X/week   Barriers to discharge Decreased caregiver support wife s/p 2 back surgeries    Co-evaluation                End of Session Equipment Utilized During Treatment: Gait belt Activity Tolerance: Patient tolerated treatment well Patient left: in chair;with call bell/phone within reach;with family/visitor present Nurse Communication: Patient requests pain meds         Time: 1129-1211 PT Time Calculation (min) (ACUTE ONLY): 42 min   Charges:   PT Evaluation $PT Eval Moderate Complexity: 1 Procedure PT Treatments $Gait Training: 8-22 mins $Therapeutic Activity: 8-22 mins   PT G Codes:        Zoeie Ritter 02-23-2015, 12:26 PM Pager [phone]

## 2015-02-08 NOTE — Care Management Note (Signed)
Case Management Note  Patient Details  Name: Gary Yates. MRN: [id number] Date of Birth: 1933-03-03  Subjective/Objective:                    Action/Plan: Patient was admitted for a Lumbar three-Sacral one Decompression.  Lives at home with spouse. Will follow for discharge needs pending PT/OT evals and physician orders.   Expected Discharge Date:                  Expected Discharge Plan:     In-House Referral:     Discharge planning Services     Post Acute Care Choice:    Choice offered to:     DME Arranged:    DME Agency:     HH Arranged:    HH Agency:     Status of Service:  In process, will continue to follow  Medicare Important Message Given:    Date Medicare IM Given:    Medicare IM give by:    Date Additional Medicare IM Given:    Additional Medicare Important Message give by:     If discussed at [place] of Stay Meetings, dates discussed:    Additional Comments:  Rolm Baptise, RN 02/08/2015, 11:41 AM [phone]

## 2015-02-08 NOTE — Progress Notes (Signed)
No acute events AVSS Awake and alert Moving extremities with full strength Incision c/d/i Drain in place Continue drain Therapy Hopefully home tomorrow

## 2015-02-09 LAB — GLUCOSE, CAPILLARY
GLUCOSE-CAPILLARY: 154 mg/dL — AB (ref 65–99)
GLUCOSE-CAPILLARY: 60 mg/dL — AB (ref 65–99)
GLUCOSE-CAPILLARY: 152 mg/dL — AB (ref 65–99)
Glucose-Capillary: 49 mg/dL — ABNORMAL LOW (ref 65–99)
Glucose-Capillary: 103 mg/dL — ABNORMAL HIGH (ref 65–99)

## 2015-02-09 MED ORDER — GLUCOSE 40 % PO GEL
ORAL | Status: AC
  Filled 2015-02-09: qty 1

## 2015-02-09 MED ORDER — GLUCOSE 40 % PO GEL
1.0000 | Freq: Once | ORAL | Status: AC
  Administered 2015-02-09: 37.5 g via ORAL

## 2015-02-09 MED ORDER — GLUCAGON HCL RDNA (DIAGNOSTIC) 1 MG IJ SOLR
INTRAMUSCULAR | Status: AC
  Administered 2015-02-09: 1 mg
  Filled 2015-02-09: qty 1

## 2015-02-09 NOTE — Progress Notes (Signed)
Urinary retention Complains of back pain, no leg pain AVSS Awake, somnolent Moves legs with full strength Sensation intact BLE Place foley, d/c ditropan and myrbetriq Anticipate he will need CIR or SNF

## 2015-02-09 NOTE — Progress Notes (Signed)
OT Cancellation Note  Patient Details Name: Adrik Panno. MRN: [id number] DOB: 1933-10-30   Cancelled Treatment:    Reason Eval/Treat Not Completed: Fatigue/lethargy limiting ability to participate;Other (comment) (Pts wife states pt finally resting). Wife requesting for OT to check back later. Wife concerned about taking pt directly home; wants to consider post acute rehab options. Will follow up for OT treatment as time allows.  Binnie Kand M.S., OTR/L Pager: [phone]  02/09/2015, 11:39 AM

## 2015-02-09 NOTE — Progress Notes (Signed)
Hypoglycemic Event  CBG: 60  Treatment: 1 tube instant glucose  Symptoms: Pale  Follow-up CBG: Time:1716/1804 CBG Result:49/103  Possible Reasons for Event: Inadequate meal intake  Comments/MD notified: Initiate Hypoglycemic protocol    Tom-Johnson, Renea Ee

## 2015-02-09 NOTE — Progress Notes (Signed)
OT Cancellation Note  Patient Details Name: Gary Yates. MRN: [id number] DOB: 01/29/1933   Cancelled Treatment:    Reason Eval/Treat Not Completed: Fatigue/lethargy limiting ability to participate (Per PT, pt continues to be very lethargic). Will follow up for OT session tomorrow.   Binnie Kand M.S., OTR/L Pager: [phone]  02/09/2015, 4:34 PM

## 2015-02-09 NOTE — Progress Notes (Signed)
Physical Therapy Treatment Patient Details Name: Gary Yates. MRN: [id number] DOB: 1933/01/29 Today's Date: 02/09/2015    History of Present Illness Pt is a 82 y.o. male s/p Reoperative laminectomy right L4-5 for decompression of L4 and L5 nerve roots, bilateral laminectomy L3-4 and L5-S1 and hemilaminectomy left L4-5, for decompression of bilateral L3, L4, L5, and S1 nerve roots. 2/2 pt very drowsy.  PMHx- CAD, DM with neuropathy, restless legs    PT Comments    Patient even more lethargic (last had pain medicine 4 hours prior to session). Required constant verbal and tactile cues to stay awake (even when sitting EOB trying to use urinal). Poor attempt at standing (stood with mod assist <5 seconds). If lethargy improves, anticipate he can make progress with PT.     Follow Up Recommendations  SNF;Supervision/Assistance - 24 hour (if lethargy improves, may be able to tolerate CIR)     Equipment Recommendations  None recommended by PT    Recommendations for Other Services       Precautions / Restrictions Precautions Precautions: Back;Fall Precaution Comments: pt drowsy and could not recall any (even with max cues); wife present and also could not recall  Restrictions Weight Bearing Restrictions: No    Mobility  Bed Mobility Overal bed mobility: Needs Assistance Bed Mobility: Rolling;Sidelying to Sit Rolling: Supervision Sidelying to sit: Mod assist;HOB elevated       General bed mobility comments: with rail and multiple cues for technique  Transfers Overall transfer level: Needs assistance Equipment used: Rolling walker (2 wheeled) Transfers: Sit to/from Stand Sit to Stand: Mod assist         General transfer comment: very slow come to stand with incr assist (leans posterior and to Rt; poor extension at knees--blocked Rt knee); returned to sit EOB with uncontrolled descent  Ambulation/Gait             General Gait Details: unable due to  lethargy   Stairs            Wheelchair Mobility    Modified Rankin (Stroke Patients Only)       Balance     Sitting balance-Leahy Scale: Poor       Standing balance-Leahy Scale: Zero                      Cognition Arousal/Alertness: Lethargic;Suspect due to medications Behavior During Therapy: Flat affect Overall Cognitive Status: Impaired/Different from baseline Area of Impairment: Following commands;Attention;Memory;Awareness;Safety/judgement   Current Attention Level: Sustained (easily drifting off to sleep (even sitting EOB) Memory: Decreased recall of precautions;Decreased short-term memory Following Commands: Follows one step commands inconsistently Safety/Judgement: Decreased awareness of safety;Decreased awareness of deficits Awareness: Intellectual   General Comments: wife reports he has been more drowsy today; sitting EOB trying to use urinal he was falling asleep; poor ability to follow commands for sit to stand    Exercises      General Comments General comments (skin integrity, edema, etc.): Wife present. Did not recall PT from yesterday or anything we discussed or did.      Pertinent Vitals/Pain Pain Assessment: 0-10 Pain Score: 6  Pain Location: back Pain Descriptors / Indicators: Operative site guarding Pain Intervention(s): Limited activity within patient's tolerance;Monitored during session;Repositioned    Home Living                      Prior Function            PT Goals (current goals  can now be found in the care plan section) Acute Rehab PT Goals Patient Stated Goal: return home Time For Goal Achievement: 02/15/15 Progress towards PT goals: Not progressing toward goals - comment (incr lethargy)    Frequency  Min 5X/week    PT Plan Discharge plan needs to be updated    Co-evaluation             End of Session Equipment Utilized During Treatment: Gait belt Activity Tolerance: Patient limited by  lethargy Patient left: in bed;with nursing/sitter in room (RN in to remove drain and pt sitting EOB for her )     Time: 1422-1450 PT Time Calculation (min) (ACUTE ONLY): 28 min  Charges:  $Therapeutic Activity: 23-37 mins                    G Codes:      Gary Yates Feb 27, 2015, 3:26 PM Pager [phone]

## 2015-02-10 LAB — GLUCOSE, CAPILLARY
GLUCOSE-CAPILLARY: 144 mg/dL — AB (ref 65–99)
Glucose-Capillary: 110 mg/dL — ABNORMAL HIGH (ref 65–99)
Glucose-Capillary: 103 mg/dL — ABNORMAL HIGH (ref 65–99)
Glucose-Capillary: 147 mg/dL — ABNORMAL HIGH (ref 65–99)
Glucose-Capillary: 91 mg/dL (ref 65–99)

## 2015-02-10 NOTE — Care Management Important Message (Signed)
Important Message  Patient Details  Name: Gary Yates. MRN: [id number] Date of Birth: 09/26/1933   Medicare Important Message Given:  Yes    Barb Merino Jarrah Seher 02/10/2015, 4:15 PM

## 2015-02-10 NOTE — Clinical Social Work Note (Signed)
Clinical Social Work Assessment  Patient Details  Name: Gary Yates. MRN: [id number] Date of Birth: 06-16-1933  Date of referral:  02/10/15               Reason for consult:  Facility Placement, Discharge Planning                Permission sought to share information with:  Facility Sport and exercise psychologist, Family Supports Permission granted to share information::  Yes, Verbal Permission Granted  Name::     [place]::  SNFs  Relationship::     Contact Information:     Housing/Transportation Living arrangements for the past 2 months:  Single Family Home Source of Information:  Patient, Adult Children Patient Interpreter Needed:  None Criminal Activity/Legal Involvement Pertinent to Current Situation/Hospitalization:  No - Comment as needed Significant Relationships:  Adult Children, Spouse Lives with:  Spouse Do you feel safe going back to the place where you live?  Yes Need for family participation in patient care:  Yes (Comment)  Care giving concerns:  Patient and family agree with recommendation for SNF placement at discharge.   Social Worker assessment / plan:  CSW met with patient and patient's family at bedside to complete assessment. Patient was admitted from home where he lives with his wife. The wife states that she is not able to assist the patient at home as she recently had a back surgery. Patient's son Gary Yates and daughter Gary Yates also present. Both children agree that SNF is the safest disposition for the patient. CSW explained SNF search/placement process and answered the family's questions. Family and patient would prefer [facility]. [facility] states that they will be able to admit the patient on 2/4. CSW will leave report for weekend CSW.  Employment status:  Retired Nurse, adult PT Recommendations:  [place] / Referral to community resources:  [place]  Patient/Family's  Response to care:  Patient and patient's family appear happy with the care the patient has received. Family is appreciative of CSW assistance.  Patient/Family's Understanding of and Emotional Response to Diagnosis, Current Treatment, and Prognosis:  Patient and patient's family appear to have a good understanding of reason for admission and post DC needs.   Emotional Assessment Appearance:  Appears stated age Attitude/Demeanor/Rapport:  Other (Patient is appropriate and welcoming of CSW.) Affect (typically observed):  Accepting, Calm, Pleasant, Appropriate Orientation:  Oriented to Self, Oriented to Place, Oriented to  Time, Oriented to Situation Alcohol / Substance use:  Never Used Psych involvement (Current and /or in the community):  No (Comment)  Discharge Needs  Concerns to be addressed:    Readmission within the last 30 days:  No Current discharge risk:  Chronically ill, Physical Impairment Barriers to Discharge:  Continued Medical Work up   [employer] MSW, [place], [place], [phone]

## 2015-02-10 NOTE — Clinical Social Work Note (Signed)
Arrangements have been made with [facility] for patient to admit once medically stable for DC. MD please complete DC summary, sign any narcotics the patient will DC on, and cosign the FL2 to assist with DC. Handoff report left for weekend CSW.   Liz Beach MSW, [place], [place], [id number]

## 2015-02-10 NOTE — Progress Notes (Signed)
No acute events Back pain improved but now complaining of leg pain Tmax 101.5 x1 VSS Moves legs with full strength Sensation intact Continue PT/OT Dispo planning

## 2015-02-10 NOTE — NC FL2 (Signed)
[place] LEVEL OF CARE SCREENING TOOL     IDENTIFICATION  Patient Name: Gary Yates. Birthdate: March 07, 1933 Sex: male Admission Date (Current Location): 02/07/2015  [facility] and [place] Number:  Herbalist and Address:  [facility], [place] [address], [place], [place] [address]      Provider Number: [id number]  Attending Physician Name and Address:  Kevan Ny Ditty, MD  Relative Name and Phone Number:  Sharyne Richters and Lattie Haw [phone]    Current Level of Care: Hospital Recommended Level of Care: [place] Prior Approval Number:    Date Approved/Denied:   PASRR Number: [id number]  Discharge Plan: SNF    Current Diagnoses: Patient Active Problem List   Diagnosis Date Noted  . Lumbosacral spondylosis with radiculopathy 02/07/2015  . At high risk for falls 05/05/2014  . Encounter for therapeutic drug monitoring 03/15/2013  . Chest pain 02/03/2013  . Obesity (BMI 30-39.9) 09/21/2012  . Poorly controlled type 2 diabetes mellitus with neuropathy ([place]) 07/03/2012  . Chronic kidney disease, stage III (moderate) 07/03/2012  . Restless leg syndrome 12/04/2011  . Long term current use of anticoagulant therapy 07/15/2011  . Paroxysmal atrial fibrillation ([place]) 06/21/2011  . Upper extremity weakness 06/21/2011  . Midsternal chest pain 06/19/2011  . Coronary artery disease   . Hypertension   . Hyperlipidemia   . COPD (chronic obstructive pulmonary disease) ([place])   . GERD (gastroesophageal reflux disease)   . Peripheral neuropathy ([place])   . Dyslipidemia 03/27/2011  . Diabetic peripheral neuropathy ([place]) 12/17/2010  . Hypotestosteronism 05/07/2010  . LEG CRAMPS, IDIOPATHIC 08/14/2009  . Insomnia 08/14/2009  . BASAL CELL CARCINOMA, NOSE 08/14/2009  . OBESITY 08/02/2009  . EDEMA 04/18/2009  . CAROTID ARTERY DISEASE 01/04/2009  . PERITONSILLAR ABSCESS 07/21/2008  . HYPERTENSION, HEART CONTROLLED W/O  ASSOC CHF 03/28/2008  . CAD, NATIVE VESSEL 03/28/2008  . G E R D 01/12/2007  . Morbid obesity ([place]) 09/15/2006  . HYPERLIPIDEMIA 07/01/2006  . Essential hypertension 07/01/2006  . CORONARY ARTERY DISEASE 07/01/2006  . COPD 07/01/2006  . OSTEOARTHRITIS 07/01/2006    Orientation RESPIRATION BLADDER Height & Weight     Self, Time, Situation, Place  Normal Continent Weight: 116 kg (255 lb 11.7 oz) Height:  5\' 10"  (177.8 cm)  BEHAVIORAL SYMPTOMS/MOOD NEUROLOGICAL BOWEL NUTRITION STATUS   (NONE)  (NONE) Continent Diet (Carb Modified)  AMBULATORY STATUS COMMUNICATION OF NEEDS Skin   Extensive Assist Verbally Surgical wounds (Incision of back)                       Personal Care Assistance Level of Assistance  Bathing, Feeding, Dressing Bathing Assistance: Limited assistance Feeding assistance: Limited assistance Dressing Assistance: Limited assistance     Functional Limitations Info  Sight, Hearing, Speech Sight Info: Adequate Hearing Info: Adequate Speech Info: Adequate    SPECIAL CARE FACTORS FREQUENCY  PT (By licensed PT), OT (By licensed OT), Speech therapy     PT Frequency: 5/week OT Frequency: 5/week     Speech Therapy Frequency: 5/week      Contractures Contractures Info: Not present    Additional Factors Info  Allergies, Code Status, Psychotropic, Insulin Sliding Scale Code Status Info: Full Allergies Info: Ace Inhibitors, Codeine, and Penicillins Psychotropic Info: Klonopin Insulin Sliding Scale Info: 3/day       Current Medications (02/10/2015):  This is the current hospital active medication list Current Facility-Administered Medications  Medication Dose Route Frequency Provider Last Rate Last  Dose  . 0.9 %  sodium chloride infusion   Intravenous Continuous Kevan Ny Ditty, MD 100 mL/hr at 02/08/15 0522    . 0.9 %  sodium chloride infusion  250 mL Intravenous Continuous Kevan Ny Ditty, MD      . acetaminophen (TYLENOL) tablet 650 mg  650  mg Oral Q4H PRN Kevan Ny Ditty, MD   650 mg at 02/10/15 [id number]   Or  . acetaminophen (TYLENOL) suppository 650 mg  650 mg Rectal Q4H PRN Kevan Ny Ditty, MD      . atorvastatin (LIPITOR) tablet 40 mg  40 mg Oral Daily Kevan Ny Ditty, MD   40 mg at 02/10/15 0920  . bisacodyl (DULCOLAX) EC tablet 5 mg  5 mg Oral Daily PRN Kevan Ny Ditty, MD      . clonazePAM [facility] [place]) tablet 1 mg  1 mg Oral QHS Kevan Ny Ditty, MD   1 mg at 02/09/15 2054  . docusate sodium (COLACE) capsule 100 mg  100 mg Oral BID Kevan Ny Ditty, MD   100 mg at 02/10/15 [id number]  . fenofibrate tablet 160 mg  160 mg Oral Daily Kevan Ny Ditty, MD   160 mg at 02/10/15 0920  . gabapentin (NEURONTIN) capsule 600 mg  600 mg Oral TID Kevan Ny Ditty, MD   600 mg at 02/10/15 0919  . HYDROcodone-acetaminophen (NORCO/VICODIN) 5-325 MG per tablet 1-2 tablet  1-2 tablet Oral Q4H PRN Tamala Fothergill, MD   1 tablet at 02/09/15 2106  . HYDROmorphone (DILAUDID) injection 0.5-1 mg  0.5-1 mg Intravenous Q2H PRN Kevan Ny Ditty, MD   1 mg at 02/10/15 [id number]  . insulin aspart (novoLOG) injection 18 Units  18 Units Subcutaneous TID WC Roma Schanz, RPH   18 Units at 02/10/15 1156  . insulin glargine (LANTUS) injection 60 Units  60 Units Subcutaneous BID Roma Schanz, RPH   60 Units at 02/10/15 [id number]  . menthol-cetylpyridinium (CEPACOL) lozenge 3 mg  1 lozenge Oral PRN Kevan Ny Ditty, MD       Or  . phenol (CHLORASEPTIC) mouth spray 1 spray  1 spray Mouth/Throat PRN Kevan Ny Ditty, MD      . metFORMIN (GLUCOPHAGE) tablet 500 mg  500 mg Oral BID WC Kevan Ny Ditty, MD   500 mg at 02/10/15 0740  . methocarbamol (ROBAXIN) tablet 500 mg  500 mg Oral Q6H PRN Kevan Ny Ditty, MD   500 mg at 02/09/15 1851   Or  . methocarbamol (ROBAXIN) 500 mg in dextrose 5 % 50 mL IVPB  500 mg Intravenous Q6H PRN Kevan Ny Ditty, MD      . metoprolol succinate (TOPROL-XL) 24 hr tablet 100 mg  100  mg Oral QPC breakfast Kevan Ny Ditty, MD   100 mg at 02/10/15 0919  . multivitamin with minerals tablet 1 tablet  1 tablet Oral Daily Kevan Ny Ditty, MD   1 tablet at 02/10/15 0920  . nitroGLYCERIN (NITROSTAT) SL tablet 0.4 mg  0.4 mg Sublingual Q5 min PRN Kevan Ny Ditty, MD      . ondansetron [facility]) injection 4 mg  4 mg Intravenous Q4H PRN Kevan Ny Ditty, MD      . oxyCODONE-acetaminophen (PERCOCET/ROXICET) 5-325 MG per tablet 1-2 tablet  1-2 tablet Oral Q4H PRN Tamala Fothergill, MD   2 tablet at 02/10/15 0745  . oxymetazoline (AFRIN) 0.05 % nasal spray 1 spray  1 spray Each Nare BID PRN Tamala Fothergill, MD      .  pantoprazole (PROTONIX) EC tablet 40 mg  40 mg Oral Daily Kevan Ny Ditty, MD   40 mg at 02/10/15 0920  . pramipexole (MIRAPEX) tablet 1.5 mg  1.5 mg Oral QHS Kevan Ny Ditty, MD   1.5 mg at 02/09/15 2117  . senna (SENOKOT) tablet 8.6 mg  1 tablet Oral BID Kevan Ny Ditty, MD   8.6 mg at 02/10/15 [id number]  . sodium chloride flush (NS) 0.9 % injection 3 mL  3 mL Intravenous Q12H Kevan Ny Ditty, MD   3 mL at 02/10/15 [id number]  . sodium chloride flush (NS) 0.9 % injection 3 mL  3 mL Intravenous PRN Kevan Ny Ditty, MD      . sodium phosphate (FLEET) 7-19 GM/118ML enema 1 enema  1 enema Rectal Once PRN Kevan Ny Ditty, MD      . tamsulosin [facility]) capsule 0.4 mg  0.4 mg Oral Daily PRN Kevan Ny Ditty, MD   0.4 mg at 02/09/15 2058  . zolpidem (AMBIEN) tablet 5 mg  5 mg Oral QHS PRN Kevan Ny Ditty, MD   5 mg at 02/09/15 0116   Facility-Administered Medications Ordered in Other Encounters  Medication Dose Route Frequency Provider Last Rate Last Dose  . testosterone cypionate (DEPOTESTOTERONE CYPIONATE) injection 200 mg  200 mg Intramuscular Q28 days [name], MD   200 mg at 12/26/11 0850  . testosterone cypionate (DEPOTESTOTERONE CYPIONATE) injection 200 mg  200 mg Intramuscular Q28 days [name], MD   200  mg at 01/23/12 0908  . testosterone cypionate (DEPOTESTOTERONE CYPIONATE) injection 200 mg  200 mg Intramuscular Q28 days [name], MD   200 mg at 02/25/12 0848  . testosterone cypionate (DEPOTESTOTERONE CYPIONATE) injection 200 mg  200 mg Intramuscular Q28 days [name], MD   200 mg at 06/03/12 1437     Discharge Medications: Please see discharge summary for a list of discharge medications.  Relevant Imaging Results:  Relevant Lab Results:   Additional Information SSN: [id number]  Rigoberto Noel [address] MSW, [place], [place], JI:[id number]

## 2015-02-10 NOTE — Progress Notes (Signed)
Physical Therapy Treatment Patient Details Name: Gary Yates. MRN: [id number] DOB: 06-14-1933 Today's Date: 02/10/2015    History of Present Illness Pt is a 82 y.o. male s/p Reoperative laminectomy right L4-5 for decompression of L4 and L5 nerve roots, bilateral laminectomy L3-4 and L5-S1 and hemilaminectomy left L4-5, for decompression of bilateral L3, L4, L5, and S1 nerve roots. 2/2 pt very drowsy.  PMHx- CAD, DM with neuropathy, restless legs    PT Comments    Patient more alert (although remains drowsy, frequent eye closing). Patient required 2 person assist to safely ambulate 55 ft. Son present and asking very appropriate questions re: d/c plan and recommendation for post-acute therapies. He is eager to get process started and hopes to talk to Education officer, museum today. Message left for [place], SW.   Follow Up Recommendations  SNF;Supervision/Assistance - 24 hour     Equipment Recommendations  None recommended by PT    Recommendations for Other Services       Precautions / Restrictions Precautions Precautions: Back;Fall Restrictions Weight Bearing Restrictions: No    Mobility  Bed Mobility Overal bed mobility: Needs Assistance Bed Mobility: Rolling;Sidelying to Sit Rolling: Min assist Sidelying to sit: Mod assist;HOB elevated       General bed mobility comments: with rail and multiple cues for technique  Transfers Overall transfer level: Needs assistance Equipment used: Rolling walker (2 wheeled) Transfers: Sit to/from Stand Sit to Stand: Mod assist;From elevated surface;+2 safety/equipment         General transfer comment: bed height to simulate at home; pt able to push from bed, then moved his hands to his thighs with decr balance and again LOB when moving hands from thighs to RW  Ambulation/Gait Ambulation/Gait assistance: Min assist;+2 safety/equipment Ambulation Distance (Feet): 55 Feet Assistive device: Rolling walker (2 wheeled) Gait Pattern/deviations:  Step-through pattern;Decreased stride length;Trunk flexed   Gait velocity interpretation: Below normal speed for age/gender General Gait Details: max cues (verbal and tactile) for upright posture with limited periods of success; chair to follow   Stairs            Wheelchair Mobility    Modified Rankin (Stroke Patients Only)       Balance     Sitting balance-Leahy Scale: Fair       Standing balance-Leahy Scale: Poor                      Cognition Arousal/Alertness: Lethargic;Suspect due to medications (better than 2/2) Behavior During Therapy: Flat affect Overall Cognitive Status: Impaired/Different from baseline Area of Impairment: Following commands;Attention;Memory;Awareness;Safety/judgement;Problem solving   Current Attention Level: Selective Memory: Decreased recall of precautions;Decreased short-term memory Following Commands: Follows one step commands with increased time Safety/Judgement: Decreased awareness of safety;Decreased awareness of deficits Awareness: Intellectual Problem Solving: Slow processing;Requires verbal cues;Requires tactile cues General Comments: difficulty figuring out how to prepare his hamburger for lunch, or remove utensils from napkin    Exercises      General Comments General comments (skin integrity, edema, etc.): Son present. Asking appropriate questions re: d/c plan and process. Left message for SW to try to see him today      Pertinent Vitals/Pain Pain Assessment: 0-10 Pain Score: 6  (9.5 when supine; 6 after ambulation) Pain Location: lower legs Pain Descriptors / Indicators: Aching Pain Intervention(s): Limited activity within patient's tolerance;Monitored during session;Premedicated before session;Repositioned    Home Living  Prior Function            PT Goals (current goals can now be found in the care plan section) Acute Rehab PT Goals Patient Stated Goal: return home Time  For Goal Achievement: 02/15/15 Progress towards PT goals: Progressing toward goals    Frequency  Min 5X/week    PT Plan Current plan remains appropriate    Co-evaluation             End of Session Equipment Utilized During Treatment: Gait belt Activity Tolerance: Patient limited by fatigue Patient left: in chair;with chair alarm set;with family/visitor present     Time: [id number] PT Time Calculation (min) (ACUTE ONLY): 29 min  Charges:  $Gait Training: 23-37 mins                    G Codes:      Gary Yates 02-11-15, 12:33 PM Pager [phone]

## 2015-02-10 NOTE — Clinical Social Work Placement (Signed)
   CLINICAL SOCIAL WORK PLACEMENT  NOTE  Date:  02/10/2015  Patient Details  Name: Gary Yates. MRN: [id number] Date of Birth: 1933-09-30  Clinical Social Work is seeking post-discharge placement for this patient at the [place] level of care (*CSW will initial, date and re-position this form in  chart as items are completed):  Yes   Patient/family provided with [place] Work Department's list of facilities offering this level of care within the geographic area requested by the patient (or if unable, by the patient's family).  Yes   Patient/family informed of their freedom to choose among providers that offer the needed level of care, that participate in Medicare, Medicaid or managed care program needed by the patient, have an available bed and are willing to accept the patient.  Yes   Patient/family informed of [facility]'s ownership interest in [facility] and [facility], as well as of the fact that they are under no obligation to receive care at these facilities.  PASRR submitted to EDS on 02/10/15     PASRR number received on 02/10/15     Existing PASRR number confirmed on       FL2 transmitted to all facilities in geographic area requested by pt/family on 02/10/15     FL2 transmitted to all facilities within larger geographic area on       Patient informed that his/her managed care company has contracts with or will negotiate with certain facilities, including the following:        Yes   Patient/family informed of bed offers received.  Patient chooses bed at [facility]     Physician recommends and patient chooses bed at      Patient to be transferred to [facility] on 02/11/15.  Patient to be transferred to facility by Personal vehicled     Patient family notified on   of transfer.  Name of family member notified:        PHYSICIAN Please prepare priority discharge summary, including  medications, Please prepare prescriptions, Please sign FL2     Additional Comment:    _______________________________________________ Liz Beach MSW, [place], [place], JI:[id number]

## 2015-02-10 NOTE — Progress Notes (Signed)
Inpatient Diabetes Program Recommendations  AACE/ADA: New Consensus Statement on Inpatient Glycemic Control (2015)  Target Ranges:  Prepandial:   less than 140 mg/dL      Peak postprandial:   less than 180 mg/dL (1-2 hours)      Critically ill patients:  140 - 180 mg/dL   Review of Glycemic Control  Inpatient Diabetes Program Recommendations:    While here, please reduce meal coverage to 10 units tidwc. Patient with hypoglycemia yesterday following lunch.  Thank you Rosita Kea, RN, MSN, CDE  Diabetes Inpatient Program Office: [phone] Pager: [phone] 8:00 am to 5:00 pm

## 2015-02-11 LAB — GLUCOSE, CAPILLARY
GLUCOSE-CAPILLARY: 74 mg/dL (ref 65–99)
Glucose-Capillary: 116 mg/dL — ABNORMAL HIGH (ref 65–99)
Glucose-Capillary: 124 mg/dL — ABNORMAL HIGH (ref 65–99)
Glucose-Capillary: 110 mg/dL — ABNORMAL HIGH (ref 65–99)

## 2015-02-11 NOTE — Progress Notes (Signed)
Subjective: Patient reports slow to mobilize  Objective: Vital signs in last 24 hours: Temp:  [97.7 F (36.5 C)-100.6 F (38.1 C)] 99.5 F (37.5 C) (02/04 0630) Pulse Rate:  [84-87] 87 (02/04 0630) Resp:  [18-20] 18 (02/04 0630) BP: (111-143)/(52-66) 143/64 mmHg (02/04 0630) SpO2:  [96 %-98 %] 98 % (02/04 0630)  Intake/Output from previous day: 02/03 0701 - 02/04 0700 In: -  Out: 2000 [Urine:2000] Intake/Output this shift: Total I/O In: -  Out: 200 [Urine:200]  Physical Exam: Walking with assistance.  Dressing CDI  Lab Results: No results for input(s): WBC, HGB, HCT, PLT in the last 72 hours. BMET No results for input(s): NA, K, CL, CO2, GLUCOSE, BUN, CREATININE, CALCIUM in the last 72 hours.  Studies/Results: No results found.  Assessment/Plan: Will work with PT this weekend and pain management.  Plan SNF Monday.    LOS: 4 days    Peggyann Shoals, MD 02/11/2015, 7:55 AM

## 2015-02-12 LAB — GLUCOSE, CAPILLARY
GLUCOSE-CAPILLARY: 63 mg/dL — AB (ref 65–99)
GLUCOSE-CAPILLARY: 85 mg/dL (ref 65–99)
GLUCOSE-CAPILLARY: 110 mg/dL — AB (ref 65–99)
Glucose-Capillary: 72 mg/dL (ref 65–99)

## 2015-02-12 MED ORDER — INSULIN ASPART 100 UNIT/ML ~~LOC~~ SOLN: 0.0000 [IU] | Freq: Three times a day (TID) | SUBCUTANEOUS | Status: DC

## 2015-02-12 MED ORDER — INSULIN GLARGINE 100 UNIT/ML ~~LOC~~ SOLN
30.0000 [IU] | Freq: Two times a day (BID) | SUBCUTANEOUS | Status: DC
  Administered 2015-02-12: 30 [IU] via SUBCUTANEOUS
  Filled 2015-02-12 (×3): qty 0.3

## 2015-02-12 NOTE — Progress Notes (Signed)
Physical Therapy Treatment Patient Details Name: Gary Yates. MRN: [id number] DOB: [birth year] Today's Date: 02/12/2015    History of Present Illness Pt is a 82 y.o. male s/p Reoperative laminectomy right L4-5 for decompression of L4 and L5 nerve roots, bilateral laminectomy L3-4 and L5-S1 and hemilaminectomy left L4-5, for decompression of bilateral L3, L4, L5, and S1 nerve roots. 2/2 pt very drowsy.  PMHx- CAD, DM with neuropathy, restless legs    PT Comments    Better progress today, more alert. Ambulating up to 80 feet with min assist for balance utilizing a RW for support, with some posterior lean initially, especially with transfers. Patient will continue to benefit from skilled physical therapy services to further improve independence with functional mobility.   Follow Up Recommendations  SNF;Supervision/Assistance - 24 hour     Equipment Recommendations  None recommended by PT    Recommendations for Other Services       Precautions / Restrictions Precautions Precautions: Back;Fall Restrictions Weight Bearing Restrictions: No    Mobility  Bed Mobility Overal bed mobility: Needs Assistance Bed Mobility: Rolling;Sidelying to Sit;Sit to Sidelying Rolling: Min assist Sidelying to sit: Min assist     Sit to sidelying: Min assist General bed mobility comments: Min assist for LE support with each transition in/out of bed. VC for technique. Slight assist for truncal support to rise to EOB. Recalls log roll technique with minimal cues.  Transfers Overall transfer level: Needs assistance Equipment used: Rolling walker (2 wheeled) Transfers: Sit to/from Stand Sit to Stand: Min assist         General transfer comment: Min assist for boost to stand. VC for upright posture. Min assist for balance once upright due to posterior loss of balance. Practiced x2 from lowest bed setting  Ambulation/Gait Ambulation/Gait assistance: Min assist Ambulation Distance (Feet): 80  Feet Assistive device: Rolling walker (2 wheeled) Gait Pattern/deviations: Step-through pattern;Decreased stride length;Shuffle;Trunk flexed Gait velocity: decreased Gait velocity interpretation: Below normal speed for age/gender General Gait Details: Intermittent cues for upright posture and to pick feet up tdue to shulffle which progressed well with distance. Slight instability of LLE but without overt buckling. Cues for safety. Slow and guarded   Stairs            Wheelchair Mobility    Modified Rankin (Stroke Patients Only)       Balance                                    Cognition Arousal/Alertness: Awake/alert Behavior During Therapy: Flat affect Overall Cognitive Status: Impaired/Different from baseline Area of Impairment: Following commands;Problem solving       Following Commands: Follows multi-step commands inconsistently     Problem Solving: Slow processing;Requires verbal cues      Exercises General Exercises - Lower Extremity Long Arc Quad: Strengthening;AAROM;Left;10 reps;Seated    General Comments General comments (skin integrity, edema, etc.): Son present and very supportive      Pertinent Vitals/Pain Pain Assessment: 0-10 Pain Score:  (Reports pain is worse at rest, No value given) Pain Location: back, LLE Pain Descriptors / Indicators: Aching Pain Intervention(s): Monitored during session;Repositioned;Limited activity within patient's tolerance    Home Living                      Prior Function            PT Goals (current goals can now be  found in the care plan section) Acute Rehab PT Goals Patient Stated Goal: return home PT Goal Formulation: With patient/family Time For Goal Achievement: 02/15/15 Potential to Achieve Goals: Good Progress towards PT goals: Progressing toward goals    Frequency  Min 5X/week    PT Plan Current plan remains appropriate    Co-evaluation             End of Session  Equipment Utilized During Treatment: Gait belt Activity Tolerance: Patient tolerated treatment well Patient left: with family/visitor present;in bed;with call bell/phone within reach;with SCD's reapplied     Time: [id number] PT Time Calculation (min) (ACUTE ONLY): 26 min  Charges:  $Gait Training: 8-22 mins $Therapeutic Activity: 8-22 mins                    G Codes:      Ellouise Newer 02/25/15, 9:51 AM Elayne Snare, [place]

## 2015-02-12 NOTE — Progress Notes (Signed)
Hypoglycemic Event  CBG: 63  Treatment: 4oz juice; 2 graham crackers  Symptoms: none Follow-up CBG: Time:6:55 CBG Result:72  Possible Reasons for Event: unknown  Comments/MD notified:72    Ranard Harte L

## 2015-02-12 NOTE — Progress Notes (Signed)
No issues overnight. Pt does have back and left hip pain. Able to ambulate.  EXAM:  BP 112/57 mmHg  Pulse 77  Temp(Src) 98.2 F (36.8 C) (Oral)  Resp 18  Ht 5\' 10"  (1.778 m)  Wt 116 kg (255 lb 11.7 oz)  BMI 36.69 kg/m2  SpO2 97%  Awake, alert, oriented  Speech fluent, appropriate  CN grossly intact  5/5 BUE/BLE  Wound intact  IMPRESSION:  82 y.o. male POD#5 s/p lumbar laminectomy, progressing slowly Hypoglycemic  PLAN: - cont to mobilize - Plan on SNF likely tomorrow - Will decrease lantus dose, stop meal coverage and change to obsese sliding scale

## 2015-02-13 ENCOUNTER — Other Ambulatory Visit: Payer: Medicare Other

## 2015-02-13 LAB — GLUCOSE, CAPILLARY
Glucose-Capillary: 66 mg/dL (ref 65–99)
Glucose-Capillary: 91 mg/dL (ref 65–99)
Glucose-Capillary: 117 mg/dL — ABNORMAL HIGH (ref 65–99)

## 2015-02-13 MED ORDER — METHOCARBAMOL 750 MG PO TABS: 750.0000 mg | ORAL_TABLET | Freq: Four times a day (QID) | ORAL | Status: DC

## 2015-02-13 MED ORDER — HYDROMORPHONE HCL 2 MG PO TABS: 2.0000 mg | ORAL_TABLET | ORAL | Status: DC | PRN

## 2015-02-13 MED ORDER — DOCUSATE SODIUM 100 MG PO CAPS: 100.0000 mg | ORAL_CAPSULE | Freq: Two times a day (BID) | ORAL | Status: DC

## 2015-02-13 NOTE — Clinical Social Work Placement (Signed)
   CLINICAL SOCIAL WORK PLACEMENT  NOTE  Date:  02/13/2015  Patient Details  Name: Gary Yates. MRN: [id number] Date of Birth: 1933-02-20  Clinical Social Work is seeking post-discharge placement for this patient at the [place] level of care (*CSW will initial, date and re-position this form in  chart as items are completed):  Yes   Patient/family provided with [place] Work Department's list of facilities offering this level of care within the geographic area requested by the patient (or if unable, by the patient's family).  Yes   Patient/family informed of their freedom to choose among providers that offer the needed level of care, that participate in Medicare, Medicaid or managed care program needed by the patient, have an available bed and are willing to accept the patient.  Yes   Patient/family informed of [facility]'s ownership interest in [facility] and [facility], as well as of the fact that they are under no obligation to receive care at these facilities.  PASRR submitted to EDS on 02/10/15     PASRR number received on 02/10/15     Existing PASRR number confirmed on       FL2 transmitted to all facilities in geographic area requested by pt/family on 02/10/15     FL2 transmitted to all facilities within larger geographic area on       Patient informed that his/her managed care company has contracts with or will negotiate with certain facilities, including the following:        Yes   Patient/family informed of bed offers received.  Patient chooses bed at [facility]     Physician recommends and patient chooses bed at      Patient to be transferred to [facility] on 02/11/15.  Patient to be transferred to facility by  Corey Harold )     Patient family notified on 02/13/15 of transfer.  Name of family member notified:   (Pt's spouse, Gary Yates )     PHYSICIAN Please prepare priority discharge  summary, including medications, Please prepare prescriptions, Please sign FL2     Additional Comment:    _______________________________________________ Rozell Searing, LCSW 02/13/2015, 11:20 AM

## 2015-02-13 NOTE — Care Management Important Message (Signed)
Important Message  Patient Details  Name: [place] Steffy. MRN: [id number] Date of Birth: 1933/03/12   Medicare Important Message Given:  Yes    Louanne Belton 02/13/2015, 11:42 AMImportant Message  Patient Details  Name: Dhylan Wenning. MRN: [id number] Date of Birth: Mar 18, 1933   Medicare Important Message Given:  Yes    Siddhi Dornbush G 02/13/2015, 11:41 AM

## 2015-02-13 NOTE — Progress Notes (Addendum)
1 hydrocodone wasted and witnessed by 2nd RN.  I took out 2 from pyxis, but only administered 1, and was unable to return before pt was discharged.    Witnessed waste by Jesse Fall, RN

## 2015-02-13 NOTE — Progress Notes (Signed)
Hypoglycemic event:  CBG: 66 Treatment: 4 Oz orange juice   Recheck CBG: 91

## 2015-02-13 NOTE — Discharge Summary (Addendum)
Date of Admission: 02/07/15  Date of Discharge: 02/13/15  Admission Diagnosis: Lumbar stenosis L3-S1, lumbar radiculopathy  Discharge Diagnosis: Same  Procedure Performed: L3-S1 laminectomy for decompression  Attending: [facility] Course:  He was admitted for the above operation.  He tolerated it well.  His post-operative course was complicated by urinary retention and problems with pain control.  He had a foley catheter placed.  He was slow to ambulate.  Therapy recommended a SNF and he is discharged at this time in stable medical and neurological condition.  Discharged Medications: Resume prior meds (see list below)  Follow up: With me in 2 weeks  No current facility-administered medications for this encounter.  Current outpatient prescriptions:  .  atorvastatin (LIPITOR) 40 MG tablet, TAKE 1 TABLET BY MOUTH DAILY, Disp: 90 tablet, Rfl: 0 .  clonazePAM (KLONOPIN) 1 MG tablet, TAKE 1 TABLET AT BEDTIME, Disp: 30 tablet, Rfl: 5 .  clopidogrel (PLAVIX) 75 MG tablet, Take 1 tablet (75 mg total) by mouth daily., Disp: 30 tablet, Rfl: 6 .  fenofibrate micronized (LOFIBRA) 134 MG capsule, Take 1 capsule by mouth  daily before breakfast, Disp: 90 capsule, Rfl: 2 .  gabapentin (NEURONTIN) 300 MG capsule, Take 2 capsules by mouth 3  times a day, Disp: 540 capsule, Rfl: 0 .  Insulin Glargine (TOUJEO SOLOSTAR) 300 UNIT/ML SOPN, Inject 50 Units into the skin 2 (two) times daily. (Patient taking differently: Inject 60 Units into the skin 2 (two) times daily. ), Disp: 1.5 mL, Rfl: 5 .  insulin lispro (HUMALOG) 100 UNIT/ML KiwkPen, Inject 18-20 units with each meal., Disp: 15 mL, Rfl: 5 .  metFORMIN (GLUCOPHAGE) 500 MG tablet, Take 1 tablet (500 mg total) by mouth 2 (two) times daily with a meal., Disp: 60 tablet, Rfl: 5 .  metoprolol succinate (TOPROL-XL) 100 MG 24 hr tablet, TAKE 1 TABLET EVERY DAY IMMEDIATELY FOLLOWING A MEAL, Disp: 90 tablet, Rfl: 1 .  Multiple Vitamin (MULTIVITAMIN) tablet,  Take 1 tablet by mouth daily., Disp: , Rfl:  .  MYRBETRIQ 50 MG TB24 tablet, Take 50 mg by mouth daily. By mouth daily, Disp: , Rfl:  .  nitroGLYCERIN (NITROSTAT) 0.4 MG SL tablet, Place 1 tablet (0.4 mg total) under the tongue every 5 (five) minutes as needed. Chest pain, Disp: 25 tablet, Rfl: 6 .  oxybutynin (DITROPAN XL) 15 MG 24 hr tablet, , Disp: , Rfl:  .  oxyCODONE-acetaminophen (PERCOCET) 10-325 MG tablet, Take 1 tablet by mouth every 6 (six) hours as needed. pain, Disp: , Rfl:  .  oxymetazoline (AFRIN) 0.05 % nasal spray, Place 1 spray into both nostrils 2 (two) times daily as needed for congestion. , Disp: , Rfl:  .  pantoprazole (PROTONIX) 40 MG tablet, TAKE 1 TABLET EVERY DAY, Disp: 90 tablet, Rfl: 2 .  pramipexole (MIRAPEX) 1.5 MG tablet, MAY TAKE 1 TO 1 AND HALF TABLETS BY MOUTH AT BEDTIME., Disp: 60 tablet, Rfl: 5 .  rivaroxaban (XARELTO) 20 MG TABS tablet, Take 1 tablet (20 mg total) by mouth daily with supper., Disp: 30 tablet, Rfl: 6 .  tamsulosin (FLOMAX) 0.4 MG CAPS capsule, Take 0.4 mg by mouth daily as needed. For fluid, Disp: , Rfl:  .  ACCU-CHEK AVIVA PLUS test strip, TEST 3 TIMES A DAY, Disp: 100 each, Rfl: 3 .  atorvastatin (LIPITOR) 40 MG tablet, TAKE 1 TABLET BY MOUTH DAILY, Disp: 90 tablet, Rfl: 1 .  clopidogrel (PLAVIX) 75 MG tablet, TAKE 1 TABLET DAILY WITH BREAKFAST (Patient not taking:  Reported on 01/30/2015), Disp: 30 tablet, Rfl: 5 .  clotrimazole (LOTRIMIN) 1 % cream, Apply 1 application topically daily as needed. For rash, Disp: , Rfl:  .  docusate sodium (COLACE) 100 MG capsule, Take 1 capsule (100 mg total) by mouth 2 (two) times daily., Disp: 10 capsule, Rfl: 0 .  glucose blood (ACCU-CHEK AVIVA PLUS) test strip, TEST 3 TIMES A DAY, Disp: 300 each, Rfl: 3 .  HYDROmorphone (DILAUDID) 2 MG tablet, Take 1 tablet (2 mg total) by mouth every 4 (four) hours as needed for severe pain., Disp: 60 tablet, Rfl: 0 .  methocarbamol (ROBAXIN) 750 MG tablet, Take 1 tablet (750  mg total) by mouth 4 (four) times daily., Disp: 120 tablet, Rfl: 2 .  [DISCONTINUED] metoprolol (LOPRESSOR) 50 MG tablet, , Disp: , Rfl:  .  [DISCONTINUED] spironolactone (ALDACTONE) 25 MG tablet, , Disp: , Rfl:   Facility-Administered Medications Ordered in Other Encounters:  .  testosterone cypionate (DEPOTESTOTERONE CYPIONATE) injection 200 mg, 200 mg, Intramuscular, Q28 days, [name], MD, 200 mg at 12/26/11 0850 .  testosterone cypionate (DEPOTESTOTERONE CYPIONATE) injection 200 mg, 200 mg, Intramuscular, Q28 days, [name], MD, 200 mg at 01/23/12 0908 .  testosterone cypionate (DEPOTESTOTERONE CYPIONATE) injection 200 mg, 200 mg, Intramuscular, Q28 days, [name], MD, 200 mg at 02/25/12 0848 .  testosterone cypionate (DEPOTESTOTERONE CYPIONATE) injection 200 mg, 200 mg, Intramuscular, Q28 days, [name], MD, 200 mg at 06/03/12 1437

## 2015-02-13 NOTE — Progress Notes (Signed)
Physical Therapy Treatment Patient Details Name: Gary Yates. MRN: [id number] DOB: October 31, 1933 Today's Date: 02/13/2015    History of Present Illness Pt is a 82 y.o. male s/p Reoperative laminectomy right L4-5 for decompression of L4 and L5 nerve roots, bilateral laminectomy L3-4 and L5-S1 and hemilaminectomy left L4-5, for decompression of bilateral L3, L4, L5, and S1 nerve roots. 2/2 pt very drowsy.  PMHx- CAD, DM with neuropathy, restless legs    PT Comments    Patient progressing well towards PT goals. Continues to be confused; reports confusing dreams with reality. Requires less assist today during transfers and improved ambulation distance however still requires Min A for balance/safety. Decreased processing time. Plans to discharge to [facility] today. Will follow if still in hospital.   Follow Up Recommendations  SNF;Supervision/Assistance - 24 hour     Equipment Recommendations  None recommended by PT    Recommendations for Other Services       Precautions / Restrictions Precautions Precautions: Back;Fall Precaution Booklet Issued: No Precaution Comments: Not able to recall any back precautions even with cues. "I guess I can't do this" as pt demonstrating twisting. Restrictions Weight Bearing Restrictions: No    Mobility  Bed Mobility               General bed mobility comments: Sitting in chair upon PT arrival.   Transfers Overall transfer level: Needs assistance Equipment used: Rolling walker (2 wheeled) Transfers: Sit to/from Stand Sit to Stand: Min guard         General transfer comment: Cues for hand placement/technique. "Don't help me"  Ambulation/Gait Ambulation/Gait assistance: Min assist Ambulation Distance (Feet): 100 Feet Assistive device: Rolling walker (2 wheeled) Gait Pattern/deviations: Step-through pattern;Decreased stride length;Shuffle;Trunk flexed Gait velocity: decreased   General Gait Details: Cues to pick up feet due to shuffling  gait and for upright posture. MIld instability LLE but no buckling. Slow, guarded gait.   Stairs            Wheelchair Mobility    Modified Rankin (Stroke Patients Only)       Balance Overall balance assessment: Needs assistance Sitting-balance support: Feet supported;No upper extremity supported Sitting balance-Leahy Scale: Fair     Standing balance support: During functional activity Standing balance-Leahy Scale: Poor Standing balance comment: Reliant on RW for support.                     Cognition Arousal/Alertness: Awake/alert Behavior During Therapy: Flat affect Overall Cognitive Status: Impaired/Different from baseline Area of Impairment: Memory;Safety/judgement     Memory: Decreased recall of precautions;Decreased short-term memory   Safety/Judgement: Decreased awareness of safety;Decreased awareness of deficits   Problem Solving: Slow processing;Requires verbal cues General Comments: Reports getting into a bender last night at [facility]- "I have trouble telling what is real from my dream."    Exercises      General Comments General comments (skin integrity, edema, etc.): Wife present during session. Wife and pt report pt is very confused today and during this admission.       Pertinent Vitals/Pain Pain Assessment: Faces Faces Pain Scale: Hurts little more Pain Location: back Pain Descriptors / Indicators: Sore Pain Intervention(s): Monitored during session;Repositioned    Home Living                      Prior Function            PT Goals (current goals can now be found in the care plan  section) Progress towards PT goals: Progressing toward goals    Frequency  Min 5X/week    PT Plan Current plan remains appropriate    Co-evaluation             End of Session Equipment Utilized During Treatment: Gait belt Activity Tolerance: Patient tolerated treatment well Patient left: in chair;with call bell/phone within  reach;with family/visitor present     Time: [id number] PT Time Calculation (min) (ACUTE ONLY): 21 min  Charges:  $Gait Training: 8-22 mins                    G Codes:      Devota Viruet A Clydia Nieves 02/13/2015, 10:35 AM Wray Kearns, [place], DPT [phone]

## 2015-02-13 NOTE — Progress Notes (Signed)
Pt discharge instructions complete with wife at bedside.  Both deny further questions, comments or concerns. Emotional support given.  Pt transferred by [facility].

## 2015-02-13 NOTE — Clinical Social Work Note (Signed)
Clinical Social Worker facilitated patient discharge including contacting patient family and facility to confirm patient discharge plans.  Clinical information faxed to facility and family agreeable with plan.  CSW arranged ambulance transport via [facility] to [facility].  RN to call report prior to discharge.  Clinical Social Worker will sign off for now as social work intervention is no longer needed. Please consult [place] again if new need arises.  Glendon Axe, MSW, LCSWA [phone] 02/13/2015 11:19 AM

## 2015-02-13 NOTE — Progress Notes (Signed)
PT'S FOLEY CATHETER REMOVED AT 0900; WILL MONITOR FOR URINE OUTPUT.

## 2015-02-13 NOTE — Progress Notes (Signed)
Foley catheter removed at 0900. Pt has not voided yet. Bladder scan showed <37ml.  ot to be discharged to [facility] soon.  Pt states he has no urge to void at this time.  Will continue to monitor closely and update as needed.

## 2015-02-13 NOTE — Progress Notes (Signed)
No acute events AVSS Awake and alert Full strength in legs Incision c/d/i Stable D/c foley, may have to be replaced Hopefully to SNF today

## 2015-02-23 ENCOUNTER — Telehealth: Payer: Self-pay | Admitting: Family Medicine

## 2015-02-23 NOTE — Telephone Encounter (Signed)
Pt will be discharged today  from [facility] would like to know if md will signed home health orders for nursing and physical therapy

## 2015-02-23 NOTE — Telephone Encounter (Signed)
Autumn see below, this looks like your patient.

## 2015-02-23 NOTE — Telephone Encounter (Signed)
See below

## 2015-02-23 NOTE — Telephone Encounter (Signed)
Aware that Dr. Elease Hashimoto will sign orders.

## 2015-02-27 ENCOUNTER — Other Ambulatory Visit: Payer: Self-pay | Admitting: Family Medicine

## 2015-03-01 ENCOUNTER — Ambulatory Visit (INDEPENDENT_AMBULATORY_CARE_PROVIDER_SITE_OTHER): Payer: Medicare Other | Admitting: Family Medicine

## 2015-03-01 VITALS — BP 110/68 | HR 123 | Temp 97.8°F | Ht 70.0 in | Wt 238.7 lb

## 2015-03-01 DIAGNOSIS — R531 Weakness: Secondary | ICD-10-CM | POA: Diagnosis not present

## 2015-03-01 DIAGNOSIS — E1165 Type 2 diabetes mellitus with hyperglycemia: Secondary | ICD-10-CM

## 2015-03-01 DIAGNOSIS — R112 Nausea with vomiting, unspecified: Secondary | ICD-10-CM

## 2015-03-01 DIAGNOSIS — K5900 Constipation, unspecified: Secondary | ICD-10-CM | POA: Diagnosis not present

## 2015-03-01 DIAGNOSIS — Z79899 Other long term (current) drug therapy: Secondary | ICD-10-CM

## 2015-03-01 DIAGNOSIS — E1140 Type 2 diabetes mellitus with diabetic neuropathy, unspecified: Secondary | ICD-10-CM

## 2015-03-01 LAB — CBC WITH DIFFERENTIAL/PLATELET
BASOS PCT: 1 % (ref 0.0–3.0)
Basophils Absolute: 0.1 10*3/uL (ref 0.0–0.1)
EOS PCT: 4.9 % (ref 0.0–5.0)
Eosinophils Absolute: 0.3 10*3/uL (ref 0.0–0.7)
HEMATOCRIT: 33.9 % — AB (ref 39.0–52.0)
Hemoglobin: 11.4 g/dL — ABNORMAL LOW (ref 13.0–17.0)
LYMPHS PCT: 26.7 % (ref 12.0–46.0)
Lymphs Abs: 1.5 10*3/uL (ref 0.7–4.0)
MCHC: 33.7 g/dL (ref 30.0–36.0)
MCV: 89.5 fl (ref 78.0–100.0)
Monocytes Absolute: 0.5 10*3/uL (ref 0.1–1.0)
Monocytes Relative: 8.3 % (ref 3.0–12.0)
Neutro Abs: 3.4 10*3/uL (ref 1.4–7.7)
Neutrophils Relative %: 59.1 % (ref 43.0–77.0)
PLATELETS: 248 10*3/uL (ref 150.0–400.0)
RBC: 3.78 Mil/uL — AB (ref 4.22–5.81)
RDW: 13.8 % (ref 11.5–15.5)
WBC: 5.8 10*3/uL (ref 4.0–10.5)

## 2015-03-01 LAB — COMPREHENSIVE METABOLIC PANEL
ALT: 9 U/L (ref 0–53)
AST: 23 U/L (ref 0–37)
Albumin: 3.7 g/dL (ref 3.5–5.2)
Alkaline Phosphatase: 54 U/L (ref 39–117)
BILIRUBIN TOTAL: 0.4 mg/dL (ref 0.2–1.2)
BUN: 14 mg/dL (ref 6–23)
CALCIUM: 9.2 mg/dL (ref 8.4–10.5)
CO2: 25 meq/L (ref 19–32)
CREATININE: 1.04 mg/dL (ref 0.40–1.50)
Chloride: 103 mEq/L (ref 96–112)
GFR: 73.02 mL/min (ref >60.00)
Glucose, Bld: 169 mg/dL — ABNORMAL HIGH (ref 70–99)
Potassium: 3.8 mEq/L (ref 3.5–5.1)
SODIUM: 137 meq/L (ref 135–145)
Total Protein: 6.3 g/dL (ref 6.0–8.3)

## 2015-03-01 NOTE — Progress Notes (Signed)
Pre visit review using our clinic review tool, if applicable. No additional management support is needed unless otherwise documented below in the visit note. 

## 2015-03-01 NOTE — Patient Instructions (Signed)
STOP the Robaxin (methocarbimol) and the Myrbetriq. Try to reduce the pain medications as much as possible.

## 2015-03-01 NOTE — Progress Notes (Signed)
Subjective:    Patient ID: Gary Uhlig., male    DOB: 04-Feb-1933, 82 y.o.   MRN: [id number]  HPI Patient seen for follow-up regarding recent back surgery followed by rehabilitation stay. He had recent L3-S1 laminectomy for decompression secondary to lumbar stenosis. He was admitted 02/07/2015 and discharge from hospital 02/13/2015 Post hospital course complicated by urinary retention and poor pain control related to his neuropathy. He was discharged to [facility] where he stayed for over one week He was at one point on 3 different medications for pain including hydrocodone, Dilaudid, and oxycodone.   He was just discharged from rehabilitation last Thursday. He is accompanied by son today. He's under increased stress because of his wife having some recent health issues herself. He's been somewhat depressed and anxious regarding that. He has home health nurse coming out weekly. Monday he developed some nausea and decreased appetite but no vomiting. Nausea improved at this time.  History of diabetic peripheral neuropathy with recent poorly controlled pain. He's had increased lethargy and some constipation from polypharmacy with pain medications. He states that he has scaled back and almost completely off pain medications past 3 days. Prior to yesterday he had not had bowel movement for about 4 days but he had a good bowel movement yesterday and feels somewhat better today  He apparently was discharged on Myrbetriq and Flomax from rehabilitation Denies any urine urgency or obstructed urinary flow at this time. No burning with urination. No fevers or chills  Type 2 diabetes. Long history of very poor control and poor compliance. He has lost about 12 pounds since his surgery. Prior to surgery he was taking 60 units of long-acting insulin twice daily and is currently on just 20 units once daily along with sliding scale insulin of NovoLog at meals. Blood sugars  been much improved with recent fasting blood sugars are around 130  Past Medical History  Diagnosis Date  . Coronary artery disease     a. h/o Overlapping stents RCA;  b. 06/2011 Cath: patent stents, nonobs dzs, NL EF.  Marland Kitchen Hypertension   . Osteoarthritis     shoulder  . Restless leg   . Hyperlipidemia   . Nephrolithiasis   . GERD (gastroesophageal reflux disease)   . Low testosterone   . SVT (supraventricular tachycardia) ([place])   . Atrial fibrillation ([place])   . DM (diabetes mellitus) ([place])     Type 2, peripheral neuropathy.  . Diabetic peripheral neuropathy ([place])   . Dysrhythmia   . COPD (chronic obstructive pulmonary disease) ([place])     pt. denies  . Dyspnea     with exertion  . Anxiety   . History of kidney stones   . Urinary frequency   . History of bronchitis    Past Surgical History  Procedure Laterality Date  . Cholecystectomy    . Cardiac catheterization  01/2013  . Coronary angioplasty  2004  . Back surgery  [place]  . Rotator cuff repair Left   . Left heart catheterization with coronary angiogram N/A 06/18/2011    Procedure: LEFT HEART CATHETERIZATION WITH CORONARY ANGIOGRAM;  Surgeon: Peter M Martinique, MD;  Location: [facility] CATH LAB;  Service: Cardiovascular;  Laterality: N/A;  . Left heart catheterization with coronary angiogram N/A 01/27/2013    Procedure: LEFT HEART CATHETERIZATION WITH CORONARY ANGIOGRAM;  Surgeon: Burnell Blanks, MD;  Location: [facility] CATH LAB;  Service: Cardiovascular;  Laterality: N/A;  . Eye surgery Bilateral  cataracts  . Colonoscopy    . Lumbar laminectomy/decompression microdiscectomy N/A 02/07/2015    Procedure: Lumbar three-Sacral one Decompression;  Surgeon: Kevan Ny Ditty, MD;  Location: [place] [facility] NEURO ORS;  Service: Neurosurgery;  Laterality: N/A;  L3 to S1 Decompression    reports that he quit smoking about 31 years ago. His smoking use included Cigarettes. He has a 30 pack-year smoking history. He has never used smokeless  tobacco. He reports that he does not drink alcohol or use illicit drugs. family history includes Alzheimer's disease in his mother; Arthritis in his brother and sister; Heart disease in his brother, father, mother, and sister; Migraines in his daughter and father; Obesity in his sister and son; Sleep apnea in his son; Thyroid disease in his daughter; Ulcers in his father. Allergies  Allergen Reactions  . Ace Inhibitors Other (See Comments)    cough  . Codeine Nausea Only and Rash       . Penicillins Rash    Childhood allergy        Review of Systems  Constitutional: Positive for fatigue. Negative for fever, chills, appetite change and unexpected weight change.  HENT: Negative for congestion, trouble swallowing and voice change.   Respiratory: Negative for cough and shortness of breath.   Cardiovascular: Negative for chest pain.  Gastrointestinal: Negative for abdominal pain.  Endocrine: Negative for polydipsia and polyuria.  Genitourinary: Negative for dysuria.  Neurological: Positive for weakness. Negative for dizziness.  Psychiatric/Behavioral: Negative for confusion.       Objective:   Physical Exam  Constitutional: He is oriented to person, place, and time. He appears well-developed and well-nourished. No distress.  HENT:  Mouth/Throat: Oropharynx is clear and moist.  Neck: Neck supple. No thyromegaly present.  Cardiovascular: Normal rate and regular rhythm.   Pulmonary/Chest: Effort normal and breath sounds normal. No respiratory distress. He has no wheezes. He has no rales.  Musculoskeletal: He exhibits no edema.  Lymphadenopathy:    He has no cervical adenopathy.  Neurological: He is alert and oriented to person, place, and time.          Assessment & Plan:  #1 recent low back surgery. Continue follow-up with neurosurgeon. He is encouraged to continue to scale back pain medications as much as possible  #2 type 2 diabetes. Recent improved control. Continue  long-acting insulin 20 units daily and sliding scale NovoLog with meals. We'll plan repeat A1c at follow-up in one month  #3 recent constipation likely related to pain medications. Increase hydration. Continue stool softener. Taper off opioids  #4 polypharmacy. We went to his list in detail. Try to reduce unnecessary medications. Reduce and stop altogether Robaxin if possible. Stop Myrbetriq.  Consider discontinue Flomax in one month if urinating well that time  #5 recent nausea without vomiting. Check labs including CBC and comprehensive metabolic panel

## 2015-03-07 ENCOUNTER — Ambulatory Visit: Payer: Medicare Other | Admitting: Family Medicine

## 2015-03-13 ENCOUNTER — Other Ambulatory Visit: Payer: Self-pay | Admitting: Family Medicine

## 2015-03-14 ENCOUNTER — Ambulatory Visit: Payer: Self-pay | Admitting: General Practice

## 2015-03-22 ENCOUNTER — Ambulatory Visit: Payer: Medicare Other | Admitting: Family Medicine

## 2015-03-24 ENCOUNTER — Telehealth: Payer: Self-pay | Admitting: Family Medicine

## 2015-03-24 NOTE — Telephone Encounter (Signed)
Is this an FYI?

## 2015-03-24 NOTE — Telephone Encounter (Signed)
Pt did not met visit frequency this week. Pt had a fall last Wednesday no injury and refused to go to er. Pt was walking to mailbox with his cane and knees buckled. Pt was advise to use rolling walking and to have someone with him.

## 2015-04-06 ENCOUNTER — Telehealth: Payer: Self-pay | Admitting: Family Medicine

## 2015-04-06 NOTE — Telephone Encounter (Signed)
Appt scheduled to see [facility] on 04/07/15

## 2015-04-06 NOTE — Telephone Encounter (Signed)
[facility] Call Center  Patient Name: Gary Yates  DOB: Sep 11, 1933    Initial Comment Caller states he had back surgery on Jan 31st. States he's off balance, and still needing a walker. Some dizziness.   Nurse Assessment  Nurse: Harlow Mares, RN, Suanne Marker Date/Time Eilene Ghazi Time): 04/06/2015 12:26:03 PM  Confirm and document reason for call. If symptomatic, describe symptoms. You must click the next button to save text entered. ---Caller states he had back surgery on Jan 31st. States he's off balance, and still needing a walker. Some dizziness. Reports that he is using a walker, but when he doesn't [place] the walker he is "staggery". His medications cause dizziness. He is wanting to know if he can change some of his meds. He reports that he can't come drive into the office due to not being able to drive. Dizziness is not getting worse, but all of his meds reports dizziness as a side effect. He would like the MD to review his meds. He currently doesn't have a follow up appt with MD.  Has the patient traveled out of the country within the last 30 days? ---No  Does the patient have any new or worsening symptoms? ---Yes  Will a triage be completed? ---Yes  Related visit to physician within the last 2 weeks? ---No  Does the PT have any chronic conditions? (i.e. diabetes, asthma, etc.) ---Yes  List chronic conditions. ---hx broken hip, diabetic (takes insulin), HTN,  Is this a behavioral health or substance abuse call? ---No     Guidelines    Guideline Title Affirmed Question Affirmed Notes  Dizziness - Lightheadedness Taking a medicine that could cause dizziness (e.g., blood pressure medications, diuretics)    Final Disposition User   See Physician within [place], RN, Suanne Marker    Comments  Caller scheduled to see MD 04/06/15 @ 2:15 at the [place] location with Shanon Ace.   Referrals  REFERRED TO PCP OFFICE   Disagree/Comply: Comply

## 2015-04-07 ENCOUNTER — Ambulatory Visit (INDEPENDENT_AMBULATORY_CARE_PROVIDER_SITE_OTHER): Payer: Medicare Other | Admitting: Internal Medicine

## 2015-04-07 ENCOUNTER — Encounter: Payer: Self-pay | Admitting: Internal Medicine

## 2015-04-07 VITALS — BP 112/64 | HR 75 | Temp 98.0°F | Resp 14 | Wt 242.0 lb

## 2015-04-07 DIAGNOSIS — G63 Polyneuropathy in diseases classified elsewhere: Secondary | ICD-10-CM

## 2015-04-07 DIAGNOSIS — Z9889 Other specified postprocedural states: Secondary | ICD-10-CM

## 2015-04-07 DIAGNOSIS — R2681 Unsteadiness on feet: Secondary | ICD-10-CM | POA: Diagnosis not present

## 2015-04-07 DIAGNOSIS — I480 Paroxysmal atrial fibrillation: Secondary | ICD-10-CM | POA: Diagnosis not present

## 2015-04-07 DIAGNOSIS — Z79899 Other long term (current) drug therapy: Secondary | ICD-10-CM | POA: Diagnosis not present

## 2015-04-07 NOTE — Patient Instructions (Signed)
Try to decrease the neurontin.   . Slowly since your pain is better .  I agree  With continued PT.   Dec neurontin  2 -300 am  1- 300 mid day and 2 -300 at night . For  1-2 weeks    Then try 1-1-2  Capsules  .   There may be other  Med adjustment .  That may help.   Continue    Rehabilitation   You may have had some deconditioning    leg weakness  Left over from  the inactivity .  and surgical .

## 2015-04-07 NOTE — Progress Notes (Signed)
Chief Complaint  Patient presents with  . Dizziness    unsteadiness following back surgery asks for med review    HPI: Gary Yates. 82 y.o.  Patient Gary Yates.  comes in today for SDA for  problem evaluation. Here with son  Send in by team health because of ongoing "dizziness" and he wants  Med review to help decide if a med side effect  PCP Dr B NA Had back surgery recenetly    Jan and doing very well  Post op  On anticoagulation  For AF actually describing  Le weak feeling  Does well with walker but  Not as strong as  Before the back issue  But had such back and leg pain that he wasn't active at that time  Fall a while back ? Not recently?  Hx Pafib  Last cv check 1 17  Nl lv func 2 16 echo  In NSR Jan  Has  seen cards and stable  Saw surgeon  yesterday  And  To continue PT  .  H H had said PCP to do PT extension so confusion about that . Med evaluation.   Nothing new   Off pain pills  arder to walk without walker  And doctor said   Neuropathy meds could cause instabilty  .   outside pain. and garage without assistance, feeling that  He may  fall .   Back pain and leg pain is gone   .    ROS: See pertinent positives and negatives per HPI. Was on every 3 hours   Pain med  And  [place]    And no pain since .  Says dm is in control no lows of extreme highs now  Past Medical History  Diagnosis Date  . Coronary artery disease     a. h/o Overlapping stents RCA;  b. 06/2011 Cath: patent stents, nonobs dzs, NL EF.  Marland Kitchen Hypertension   . Osteoarthritis     shoulder  . Restless leg   . Hyperlipidemia   . Nephrolithiasis   . GERD (gastroesophageal reflux disease)   . Low testosterone   . SVT (supraventricular tachycardia) ([place])   . Atrial fibrillation ([place])   . DM (diabetes mellitus) ([place])     Type 2, peripheral neuropathy.  . Diabetic peripheral neuropathy ([employer] [facility])   . Dysrhythmia   . COPD (chronic obstructive pulmonary disease) ([place])     pt. denies  . Dyspnea     with exertion   . Anxiety   . History of kidney stones   . Urinary frequency   . History of bronchitis     Family History  Problem Relation Age of Onset  . Coronary artery disease      Male 1st degree relative <50  . Coronary artery disease      male 1st degree relative <60  . Heart disease Father   . Migraines Father   . Ulcers Father   . Alzheimer's disease Mother   . Heart disease Mother   . Heart disease Sister   . Obesity Sister     Morbid  . Arthritis Sister   . Heart disease Brother   . Arthritis Brother   . Sleep apnea Son   . Obesity Son   . Migraines Daughter   . Thyroid disease Daughter     Social History   Social History  . Marital Status: Married    Spouse Name: N/A  . Number of Children: 3  .  Years of Education: N/A   Occupational History  . Retired    Social History Main Topics  . Smoking status: Former Smoker -- 1.50 packs/day for 20 years    Types: Cigarettes    Quit date: 04/05/1983  . Smokeless tobacco: Never Used  . Alcohol Use: No  . Drug Use: No  . Sexual Activity: Not Currently   Other Topics Concern  . None   Social History Narrative    Outpatient Prescriptions Prior to Visit  Medication Sig Dispense Refill  . ACCU-CHEK AVIVA PLUS test strip TEST 3 TIMES A DAY 100 each 3  . atorvastatin (LIPITOR) 40 MG tablet TAKE 1 TABLET BY MOUTH DAILY 90 tablet 1  . clonazePAM (KLONOPIN) 1 MG tablet TAKE 1 TABLET AT BEDTIME 30 tablet 5  . clopidogrel (PLAVIX) 75 MG tablet TAKE 1 TABLET DAILY WITH BREAKFAST 30 tablet 5  . docusate sodium (COLACE) 100 MG capsule Take 1 capsule (100 mg total) by mouth 2 (two) times daily. 10 capsule 0  . fenofibrate micronized (LOFIBRA) 134 MG capsule Take 1 capsule by mouth  daily before breakfast 90 capsule 2  . gabapentin (NEURONTIN) 300 MG capsule Take 2 capsules by mouth 3  times a day 540 capsule 0  . glucose blood (ACCU-CHEK AVIVA PLUS) test strip TEST 3 TIMES A DAY 300 each 3  . Insulin Glargine (TOUJEO SOLOSTAR)  300 UNIT/ML SOPN Inject 50 Units into the skin 2 (two) times daily. (Patient taking differently: Inject 20 Units into the skin daily. ) 1.5 mL 5  . insulin lispro (HUMALOG) 100 UNIT/ML KiwkPen Inject 18-20 units with each meal. 15 mL 5  . metFORMIN (GLUCOPHAGE) 500 MG tablet Take 1 tablet (500 mg total) by mouth 2 (two) times daily with a meal. 60 tablet 5  . metoprolol succinate (TOPROL-XL) 100 MG 24 hr tablet TAKE 1 TABLET EVERY DAY IMMEDIATELY FOLLOWING A MEAL 90 tablet 1  . Multiple Vitamin (MULTIVITAMIN) tablet Take 1 tablet by mouth daily.    . nitroGLYCERIN (NITROSTAT) 0.4 MG SL tablet Place 1 tablet (0.4 mg total) under the tongue every 5 (five) minutes as needed. Chest pain 25 tablet 6  . pantoprazole (PROTONIX) 40 MG tablet TAKE 1 TABLET EVERY DAY 90 tablet 2  . pramipexole (MIRAPEX) 1.5 MG tablet MAY TAKE 1 TO 1 AND HALF TABLETS BY MOUTH AT BEDTIME. 60 tablet 5  . rivaroxaban (XARELTO) 20 MG TABS tablet Take 1 tablet (20 mg total) by mouth daily with supper. 30 tablet 6  . tamsulosin (FLOMAX) 0.4 MG CAPS capsule Take 0.4 mg by mouth daily as needed. For fluid     Facility-Administered Medications Prior to Visit  Medication Dose Route Frequency Provider Last Rate Last Dose  . testosterone cypionate (DEPOTESTOTERONE CYPIONATE) injection 200 mg  200 mg Intramuscular Q28 days [name], MD   200 mg at 12/26/11 0850  . testosterone cypionate (DEPOTESTOTERONE CYPIONATE) injection 200 mg  200 mg Intramuscular Q28 days [name], MD   200 mg at 01/23/12 0908  . testosterone cypionate (DEPOTESTOTERONE CYPIONATE) injection 200 mg  200 mg Intramuscular Q28 days [name], MD   200 mg at 02/25/12 0848  . testosterone cypionate (DEPOTESTOTERONE CYPIONATE) injection 200 mg  200 mg Intramuscular Q28 days [name], MD   200 mg at 06/03/12 1437     EXAM:  BP 112/64 mmHg  Pulse 75  Temp(Src) 98 F (36.7 C) (Oral)  Resp 14  Wt 242 lb (109.77 kg)  SpO2 95%  Body  mass index is 34.72 kg/(m^2).  GENERAL: vitals reviewed and listed above, alert, oriented, appears well hydrated and in no acute distress walks extermly well with walker   Some prob getting from chair but independent no foot drop  HEENT: atraumatic, conjunctiva  clear, no obvious abnormalities on inspection of external nose and ears ema or exudate  NECK: no obvious masses on inspection palpation  LUNGS: clear to auscultation bilaterally, no wheezes, rales or rhonchi, CV: HRRR, no clubbing cyanosisr  1+  peripheral edema nl cap refill  MS: moves all extremities without noticeable focal  abnormality PSYCH: pleasant and cooperative, no obvious depression or anxiety nl speech  Lab Results  Component Value Date   WBC 5.8 03/01/2015   HGB 11.4* 03/01/2015   HCT 33.9* 03/01/2015   PLT 248.0 03/01/2015   GLUCOSE 169* 03/01/2015   CHOL 180 12/05/2014   TRIG * 12/05/2014    470.0 Triglyceride is over 400; calculations on Lipids are invalid.   HDL 27.50* 12/05/2014   LDLDIRECT 85.0 12/05/2014   LDLCALC 81 06/20/2011   ALT 9 03/01/2015   AST 23 03/01/2015   NA 137 03/01/2015   K 3.8 03/01/2015   CL 103 03/01/2015   CREATININE 1.04 03/01/2015   BUN 14 03/01/2015   CO2 25 03/01/2015   TSH 3.97 01/19/2014   PSA 0.87 08/14/2009   INR 1.08 02/07/2015   HGBA1C 10.7* 02/01/2015   MICROALBUR 6.9* 08/14/2009    ASSESSMENT AND PLAN:  Discussed the following assessment and plan:  Unsteadiness on feet  Polyneuropathy associated with underlying disease ([place])  Medication management neurontin clonipan  flomax   Pain meds can cause sx  Plan dec gabapentin slowly because  Pain is better  I suspect deconditioning from  immobiilty and pain pre surgery etc  Keep appt dr Seward Speck  April 12   For  Further evaluation fu  -Patient advised to return or notify health care team  if symptoms worsen ,persist or new concerns arise. Total visit 26mins > 50% spent counseling and coordinating care as  indicated in above note and in instructions to patient .     Patient Instructions  Try to decrease the neurontin.   . Slowly since your pain is better .  I agree  With continued PT.   Dec neurontin  2 -300 am  1- 300 mid day and 2 -300 at night . For  1-2 weeks    Then try 1-1-2  Capsules  .   There may be other  Med adjustment .  That may help.   Continue    Rehabilitation   You may have had some deconditioning    leg weakness  Left over from  the inactivity .  and surgical .         Standley Brooking. Panosh M.D.

## 2015-04-19 ENCOUNTER — Ambulatory Visit: Payer: Medicare Other | Admitting: Family Medicine

## 2015-05-10 ENCOUNTER — Ambulatory Visit (INDEPENDENT_AMBULATORY_CARE_PROVIDER_SITE_OTHER): Payer: Medicare Other | Admitting: Family Medicine

## 2015-05-10 ENCOUNTER — Encounter: Payer: Self-pay | Admitting: Family Medicine

## 2015-05-10 VITALS — BP 100/58 | HR 73 | Temp 98.8°F | Ht 70.0 in | Wt 246.2 lb

## 2015-05-10 DIAGNOSIS — E785 Hyperlipidemia, unspecified: Secondary | ICD-10-CM

## 2015-05-10 DIAGNOSIS — Z23 Encounter for immunization: Secondary | ICD-10-CM | POA: Diagnosis not present

## 2015-05-10 DIAGNOSIS — N183 Chronic kidney disease, stage 3 unspecified: Secondary | ICD-10-CM

## 2015-05-10 DIAGNOSIS — I10 Essential (primary) hypertension: Secondary | ICD-10-CM

## 2015-05-10 DIAGNOSIS — E1140 Type 2 diabetes mellitus with diabetic neuropathy, unspecified: Secondary | ICD-10-CM | POA: Diagnosis not present

## 2015-05-10 DIAGNOSIS — E1165 Type 2 diabetes mellitus with hyperglycemia: Secondary | ICD-10-CM

## 2015-05-10 LAB — LIPID PANEL
CHOL/HDL RATIO: 4
Cholesterol: 139 mg/dL (ref 0–200)
HDL: 32.4 mg/dL — ABNORMAL LOW (ref >39.00)
LDL Cholesterol: 76 mg/dL (ref 0–99)
NONHDL: 106.19
Triglycerides: 151 mg/dL — ABNORMAL HIGH (ref 0.0–149.0)
VLDL: 30.2 mg/dL (ref 0.0–40.0)

## 2015-05-10 LAB — HEPATIC FUNCTION PANEL
ALBUMIN: 4.1 g/dL (ref 3.5–5.2)
ALK PHOS: 49 U/L (ref 39–117)
ALT: 11 U/L (ref 0–53)
AST: 23 U/L (ref 0–37)
BILIRUBIN TOTAL: 0.5 mg/dL (ref 0.2–1.2)
Bilirubin, Direct: 0.1 mg/dL (ref 0.0–0.3)
TOTAL PROTEIN: 6.6 g/dL (ref 6.0–8.3)

## 2015-05-10 LAB — BASIC METABOLIC PANEL
BUN: 18 mg/dL (ref 6–23)
CALCIUM: 9.7 mg/dL (ref 8.4–10.5)
CO2: 25 meq/L (ref 19–32)
CREATININE: 1.2 mg/dL (ref 0.40–1.50)
Chloride: 104 mEq/L (ref 96–112)
GFR: 61.87 mL/min (ref >60.00)
GLUCOSE: 123 mg/dL — AB (ref 70–99)
Potassium: 4.2 mEq/L (ref 3.5–5.1)
Sodium: 137 mEq/L (ref 135–145)

## 2015-05-10 LAB — HEMOGLOBIN A1C: Hgb A1c MFr Bld: 7.4 % — ABNORMAL HIGH (ref 4.6–6.5)

## 2015-05-10 NOTE — Patient Instructions (Signed)
Taper back Gabapentin by one tablet every week by the following regimen as discussed:  AM            Noon                PM  2                1                     2 1                1                     2 1                1                      1

## 2015-05-10 NOTE — Progress Notes (Signed)
Subjective:    Patient ID: Gary Yates., male    DOB: 1933-06-01, 82 y.o.   MRN: [id number]  HPI Patient seen for Medicare wellness visit and medical follow-up  Needs tetanus booster. Other immunizations up-to-date. Colonoscopy up-to-date.  Had recent back surgery several months ago and still recovering. He had extensive physical therapy. Ambulating with a cane. Denies recent fall. Still complains of excessive fatigue and some gradual increased leg edema since January.  He has a long history of diabetic peripheral neuropathy and poorly controlled diabetes. His gabapentin was increased to 600 mg 3 times a day when his in hospital back in January. He had some increased edema since then. Denies any neuropathy pain currently. No orthopnea.  Type 2 diabetes. History of poor control. History of poor compliance. Currently takes Toujeo 20 units once daily and Humalog but only taking this one time per day. Not checking blood sugars frequently.  Hyperlipidemia on chronic statin and fenofibrate use. No myalgias.  Restless leg syndrome which is chronic and fairly well controlled on Mirapex  1.  Risk factors based on Past Medical , Social, and Family history reviewed and as indicated above with no changes 2.  Limitations in physical activities None.  No recent falls. 3.  Depression/mood No active depression or anxiety issues 4.  Hearing No defiits 5.  ADLs independent in all. 6.  Cognitive function (orientation to time and place, language, writing, speech,memory) no short or long term memory issues.  Language and judgement intact. 7.  Home Safety no issues 8.  Height, weight, and visual acuity.all stable. 9.  Counseling discussed  10. Recommendation of preventive services. 11. Labs based on risk factors 12. Care Plan 13. Other Providers 14. Written schedule of screening/prevention services given to patient.   Past Medical History  Diagnosis Date  . Coronary artery disease     a. h/o  Overlapping stents RCA;  b. 06/2011 Cath: patent stents, nonobs dzs, NL EF.  Marland Kitchen Hypertension   . Osteoarthritis     shoulder  . Restless leg   . Hyperlipidemia   . Nephrolithiasis   . GERD (gastroesophageal reflux disease)   . Low testosterone   . SVT (supraventricular tachycardia) ([place])   . Atrial fibrillation ([employer])   . DM (diabetes mellitus) ([place])     Type 2, peripheral neuropathy.  . Diabetic peripheral neuropathy ([place])   . Dysrhythmia   . COPD (chronic obstructive pulmonary disease) ([place])     pt. denies  . Dyspnea     with exertion  . Anxiety   . History of kidney stones   . Urinary frequency   . History of bronchitis    Past Surgical History  Procedure Laterality Date  . Cholecystectomy    . Cardiac catheterization  01/2013  . Coronary angioplasty  2004  . Back surgery  [place]  . Rotator cuff repair Left   . Left heart catheterization with coronary angiogram N/A 06/18/2011    Procedure: LEFT HEART CATHETERIZATION WITH CORONARY ANGIOGRAM;  Surgeon: Peter M Martinique, MD;  Location: [facility] CATH LAB;  Service: Cardiovascular;  Laterality: N/A;  . Left heart catheterization with coronary angiogram N/A 01/27/2013    Procedure: LEFT HEART CATHETERIZATION WITH CORONARY ANGIOGRAM;  Surgeon: Burnell Blanks, MD;  Location: [facility] CATH LAB;  Service: Cardiovascular;  Laterality: N/A;  . Eye surgery Bilateral     cataracts  . Colonoscopy    . Lumbar laminectomy/decompression microdiscectomy N/A 02/07/2015    Procedure: Lumbar three-Sacral one  Decompression;  Surgeon: Kevan Ny Ditty, MD;  Location: [place] NEURO ORS;  Service: Neurosurgery;  Laterality: N/A;  L3 to S1 Decompression    reports that he quit smoking about 32 years ago. His smoking use included Cigarettes. He has a 30 pack-year smoking history. He has never used smokeless tobacco. He reports that he does not drink alcohol or use illicit drugs. family history includes Alzheimer's disease in his mother; Arthritis in his  brother and sister; Heart disease in his brother, father, mother, and sister; Migraines in his daughter and father; Obesity in his sister and son; Sleep apnea in his son; Thyroid disease in his daughter; Ulcers in his father. Allergies  Allergen Reactions  . Ace Inhibitors Other (See Comments)    cough  . Codeine Nausea Only and Rash       . Penicillins Rash    Childhood allergy          Review of Systems  Constitutional: Positive for fatigue. Negative for fever and chills.  Eyes: Negative for visual disturbance.  Respiratory: Negative for cough, chest tightness and wheezing.   Cardiovascular: Positive for leg swelling. Negative for chest pain and palpitations.  Gastrointestinal: Negative for nausea, vomiting, abdominal pain, diarrhea and constipation.  Endocrine: Negative for polydipsia and polyuria.  Genitourinary: Negative for dysuria.       Occasional slow stream  Musculoskeletal: Negative for back pain.  Skin: Negative for rash.  Neurological: Negative for dizziness, syncope, weakness, light-headedness and headaches.  Psychiatric/Behavioral: Negative for confusion.       Objective:   Physical Exam  Constitutional: He is oriented to person, place, and time. He appears well-developed and well-nourished.  HENT:  Right Ear: External ear normal.  Left Ear: External ear normal.  Mouth/Throat: Oropharynx is clear and moist.  Neck: Neck supple. No thyromegaly present.  Cardiovascular: Normal rate and regular rhythm.   Pulmonary/Chest: Effort normal and breath sounds normal. No respiratory distress. He has no wheezes. He has no rales.  Musculoskeletal: He exhibits edema.  Trace nonpitting edema legs bilaterally  Neurological: He is alert and oriented to person, place, and time. No cranial nerve deficit.  Psychiatric: He has a normal mood and affect. His behavior is normal.          Assessment & Plan:  #1 health maintenance. Tetanus booster given. Other immunizations  up-to-date. Reminder for yearly flu vaccine  #2 bilateral leg edema. Probably multifactorial. On high-dose gabapentin which certainly could be contributing. We'll try tapering back  #3 fatigue. With fatigue and sedation issues in addition to edema we recommended trying to gradually reduce his gabapentin by reducing 1 tablet per week down to 300 mg 3 times a day from current dosage of 600 mg 3 times a day. Reassess one month  #4 type 2 diabetes. Improved by home readings. Recheck A1c. If not controlled increase Humalog 3 times a day with meals  #5 dyslipidemia. Recheck lipid and hepatic panel

## 2015-05-10 NOTE — Progress Notes (Signed)
Pre visit review using our clinic review tool, if applicable. No additional management support is needed unless otherwise documented below in the visit note. 

## 2015-05-19 ENCOUNTER — Telehealth: Payer: Self-pay | Admitting: Family Medicine

## 2015-05-19 NOTE — Telephone Encounter (Signed)
Wife called and ask if you would call her. She said she has questions about his pre visit

## 2015-05-22 NOTE — Telephone Encounter (Signed)
Pt is unsure what they were gonna ask about the visit. They will call back with other question.

## 2015-05-24 ENCOUNTER — Telehealth: Payer: Self-pay | Admitting: *Deleted

## 2015-05-24 NOTE — Telephone Encounter (Signed)
Spoke with pt. He has just gone into doughnut hole and cost of Xarelto will go to $173.00 per month. He cannot afford this. He states he has been declined for assistance in the past due to his income.  He is asking if there is a cheaper alternative. I told him as he is in the doughnut hole other new anticoagulants would probably cost the same. He has taken warfarin in the past and is willing to go back on this.  He gets INR checked in the [place] office.  He is aware he would need to resume INR monitoring if changed back to Xarelto.  He currently has enough Xarelto to last until Saturday.  I called Xarelto assistance program and if patient's income is less than $48,720 he may still qualify for assistance. He would need to send in cost of out of pocket prescriptions.  I called pt back and his income is greater than this. Pt states he called [facility] and his cost for warfarin would be about $40 for 3 months.  Will forward to Dr. Angelena Form to see if pt can stop Xarelto and change back to warfarin.

## 2015-05-24 NOTE — Telephone Encounter (Signed)
Patient called and stated that he is in the coverage gap on the xarelto and he cannot afford it now. We do not have any samples today so I mentioned the patient assistance program. He stated that he does not qualify as his income is over the limit. He is inquiring about switching to something more affordable. He has about three days of xarelto left. He can be reached at [phone]. Thanks, MI

## 2015-05-25 ENCOUNTER — Telehealth: Payer: Self-pay | Admitting: Family Medicine

## 2015-05-25 MED ORDER — WARFARIN SODIUM 5 MG PO TABS: 5.0000 mg | ORAL_TABLET | Freq: Every day | ORAL | Status: DC

## 2015-05-25 NOTE — Telephone Encounter (Signed)
I am ok with him changing back to warfarin. Gary Yates

## 2015-05-25 NOTE — Telephone Encounter (Signed)
Pt states he is switching back to warfarin from Xarelto.  Pt is to take both today through Sat. Then start warfarin only on Sunday. Pt is scheduled to have inr check wed, 5/24. Pt states he dose is 7.5 mg on Mon, Wed and Friday. Other days 5 mg. Pt states call him if you have any questions

## 2015-05-25 NOTE — Telephone Encounter (Signed)
Spoke with pt and gave him instructions from Fuller Canada, PharmD.  Pt will start warfarin today.  Will [facility] to [facility] on [address]. Pt aware refills for warfarin will come from [facility]   He will contact [place] office today to schedule appt for INR check on 5/25.

## 2015-05-25 NOTE — Telephone Encounter (Signed)
Pt used to take Coumadin managed by [facility]. He was nonadherent with his visits and did not have a single therapeutic INR in 2016 and only 2 therapeutic readings in 2015. Please advise him that if he is switched back to Coumadin, he will need to take his Coumadin daily and show up for his INR checks.  For switching from Xarelto back to Coumadin, would have him overlap for 3 days taking both. Most recent Coumadin dosing was for 5mg  tablets with pt taking 5mg  daily except 7.5mg  on Mondays, Wednesdays, and Fridays. Would have him restart this dose and f/u with [facility] 1 week after starting Coumadin.

## 2015-05-31 ENCOUNTER — Ambulatory Visit: Payer: Medicare Other

## 2015-06-01 ENCOUNTER — Ambulatory Visit (INDEPENDENT_AMBULATORY_CARE_PROVIDER_SITE_OTHER): Payer: Medicare Other | Admitting: General Practice

## 2015-06-01 DIAGNOSIS — I480 Paroxysmal atrial fibrillation: Secondary | ICD-10-CM

## 2015-06-01 LAB — POCT INR: INR: 2.3

## 2015-06-01 NOTE — Progress Notes (Signed)
Pre visit review using our clinic review tool, if applicable. No additional management support is needed unless otherwise documented below in the visit note. Prior coumadin patient is switching back to coumadin from Xarelto.  Patient began taking prior dosage of coumadin on Saturday 5/20th.  I will re-check patient on 6/2 when he comes in to see Dr. Elease Hashimoto. Reiterated the importance of taking medication every day around the same time and the importance of having regular INR checks.  A full discussion of the nature of anticoagulants has been carried out.  A benefit risk analysis has been presented to the patient, so that they understand the justification for choosing anticoagulation at this time. The need for frequent and regular monitoring, precise dosage adjustment and compliance is stressed.  Side effects of potential bleeding are discussed.  The patient should avoid any OTC items containing aspirin or ibuprofen, and should avoid great swings in general diet.  Avoid alcohol consumption.  Call if any signs of abnormal bleeding.

## 2015-06-09 ENCOUNTER — Ambulatory Visit (INDEPENDENT_AMBULATORY_CARE_PROVIDER_SITE_OTHER): Payer: Medicare Other | Admitting: Family Medicine

## 2015-06-09 ENCOUNTER — Ambulatory Visit (INDEPENDENT_AMBULATORY_CARE_PROVIDER_SITE_OTHER): Payer: Medicare Other | Admitting: General Practice

## 2015-06-09 ENCOUNTER — Encounter: Payer: Self-pay | Admitting: Family Medicine

## 2015-06-09 VITALS — BP 130/68 | HR 73 | Temp 98.0°F | Ht 70.0 in | Wt 246.1 lb

## 2015-06-09 DIAGNOSIS — Z5181 Encounter for therapeutic drug level monitoring: Secondary | ICD-10-CM

## 2015-06-09 DIAGNOSIS — E1140 Type 2 diabetes mellitus with diabetic neuropathy, unspecified: Secondary | ICD-10-CM

## 2015-06-09 DIAGNOSIS — E1165 Type 2 diabetes mellitus with hyperglycemia: Secondary | ICD-10-CM

## 2015-06-09 DIAGNOSIS — I480 Paroxysmal atrial fibrillation: Secondary | ICD-10-CM

## 2015-06-09 DIAGNOSIS — G6289 Other specified polyneuropathies: Secondary | ICD-10-CM

## 2015-06-09 DIAGNOSIS — Z9181 History of falling: Secondary | ICD-10-CM | POA: Diagnosis not present

## 2015-06-09 LAB — VITAMIN D 25 HYDROXY (VIT D DEFICIENCY, FRACTURES): VITD: 36.41 ng/mL (ref 30.00–100.00)

## 2015-06-09 LAB — POCT INR: INR: 3.4

## 2015-06-09 LAB — VITAMIN B12: Vitamin B-12: 230 pg/mL (ref 211–911)

## 2015-06-09 NOTE — Progress Notes (Signed)
Pre visit review using our clinic review tool, if applicable. No additional management support is needed unless otherwise documented below in the visit note. 

## 2015-06-09 NOTE — Patient Instructions (Signed)
We will set up physical therapy referral. Use a cane at all times.

## 2015-06-09 NOTE — Progress Notes (Signed)
I have reviewed and agree with the plan. 

## 2015-06-09 NOTE — Progress Notes (Signed)
Subjective:    Patient ID: Gary Yates., male    DOB: [birth year], 82 y.o.   MRN: [id number]  HPI Patient here for several items  Type 2 diabetes. Improving control. Most recent A1c 7.4%. Blood sugar stable by home readings. No hypoglycemia.  Recent increased sedation. We reduced his gabapentin to 300 mg 3 times a day. No increase in neuropathy symptoms. Still has increased fatigue.  Complains of feeling off balance and frequently ""staggering". She has history of chronic peripheral neuropathy. Currently on gabapentin as above. He has sensation loss in both feet. Denies any recent vertigo. No syncope. He also feels he has had some increased weakness since his back surgery several months ago he thinks that may be contributing. Ambulates with a cane. He had physical therapy following his surgery.  Past Medical History  Diagnosis Date  . Coronary artery disease     a. h/o Overlapping stents RCA;  b. 06/2011 Cath: patent stents, nonobs dzs, NL EF.  Marland Kitchen Hypertension   . Osteoarthritis     shoulder  . Restless leg   . Hyperlipidemia   . Nephrolithiasis   . GERD (gastroesophageal reflux disease)   . Low testosterone   . SVT (supraventricular tachycardia) ([place])   . Atrial fibrillation ([place])   . DM (diabetes mellitus) ([employer])     Type 2, peripheral neuropathy.  . Diabetic peripheral neuropathy ([place])   . Dysrhythmia   . COPD (chronic obstructive pulmonary disease) ([place])     pt. denies  . Dyspnea     with exertion  . Anxiety   . History of kidney stones   . Urinary frequency   . History of bronchitis    Past Surgical History  Procedure Laterality Date  . Cholecystectomy    . Cardiac catheterization  01/2013  . Coronary angioplasty  2004  . Back surgery  [place]  . Rotator cuff repair Left   . Left heart catheterization with coronary angiogram N/A 06/18/2011    Procedure: LEFT HEART CATHETERIZATION WITH CORONARY ANGIOGRAM;  Surgeon: Peter M Martinique, MD;  Location: [facility] C[facility] LAB;   Service: Cardiovascular;  Laterality: N/A;  . Left heart catheterization with coronary angiogram N/A 01/27/2013    Procedure: LEFT HEART CATHETERIZATION WITH CORONARY ANGIOGRAM;  Surgeon: Burnell Blanks, MD;  Location[facility] CATH LAB;  Service: Cardiovascular;  Laterality: N/A;  . Eye surgery Bilateral     cataracts  . Colonoscopy    . Lumbar laminectomy/decompression microdiscectomy N/A 02/07/2015    Procedure: Lumbar three-Sacral one Decompression;  Surgeon: Kevan Ny Ditty, MD;  Location: [place] NEURO ORS;  Service: Neurosurgery;  Laterality: N/A;  L3 to S1 Decompression    reports that he quit smoking about 32 years ago. His smoking use included Cigarettes. He has a 30 pack-year smoking history. He has never used smokeless tobacco. He reports that he does not drink alcohol or use illicit drugs. family history includes Alzheimer's disease in his mother; Arthritis in his brother and sister; Heart disease in his brother, father, mother, and sister; Migraines in his daughter and father; Obesity in his sister and son; Sleep apnea in his son; Thyroid disease in his daughter; Ulcers in his father. Allergies  Allergen Reactions  . Ace Inhibitors Other (See Comments)    cough  . Codeine Nausea Only and Rash       . Penicillins Rash    Childhood allergy      Review of Systems  Constitutional: Positive for fatigue.  Eyes: Negative  for visual disturbance.  Respiratory: Negative for cough, chest tightness and shortness of breath.   Cardiovascular: Positive for leg swelling. Negative for chest pain and palpitations.  Endocrine: Negative for polydipsia and polyuria.  Genitourinary: Negative for dysuria.  Musculoskeletal: Positive for back pain and gait problem.  Neurological: Negative for dizziness, syncope, weakness, light-headedness and headaches.       Objective:   Physical Exam  Constitutional: He appears well-developed and well-nourished.  Neck: Neck supple. No thyromegaly  present.  Cardiovascular: Normal rate and regular rhythm.   Pulmonary/Chest: Effort normal and breath sounds normal. No respiratory distress. He has no wheezes. He has no rales.  Musculoskeletal: He exhibits edema.  Trace pitting edema in both lower legs  Neurological:  Impaired monofilament testing in both feet. Positive Romberg test          Assessment & Plan:  #1 type 2 diabetes. Improving control. We'll plan A1c at follow-up visit  #2 chronic peripheral neuropathy. Suspect largely related to his diabetes. Will check B12 since is on chronic metformin and has not had this reassessed recently.  #3 increased risk of falls. Set up physical therapy for balance training. Suspect largely on the basis of his neuropathy and also weakness from recent back surgery. Also check vitamin D level  [name] MD [facility] at [facility]

## 2015-06-12 MED ORDER — VITAMIN B-12 1000 MCG PO TABS: 1000.0000 ug | ORAL_TABLET | Freq: Every day | ORAL | Status: DC

## 2015-06-12 NOTE — Addendum Note (Signed)
Addended by: Elio Forget on: 06/12/2015 09:34 AM   Modules accepted: Orders

## 2015-06-22 ENCOUNTER — Other Ambulatory Visit: Payer: Self-pay | Admitting: Family Medicine

## 2015-06-23 NOTE — Telephone Encounter (Signed)
Refill for 6 months. 

## 2015-06-26 ENCOUNTER — Ambulatory Visit (INDEPENDENT_AMBULATORY_CARE_PROVIDER_SITE_OTHER): Payer: Medicare Other | Admitting: General Practice

## 2015-06-26 ENCOUNTER — Telehealth: Payer: Self-pay | Admitting: Family Medicine

## 2015-06-26 DIAGNOSIS — R600 Localized edema: Secondary | ICD-10-CM

## 2015-06-26 DIAGNOSIS — G629 Polyneuropathy, unspecified: Secondary | ICD-10-CM

## 2015-06-26 DIAGNOSIS — I480 Paroxysmal atrial fibrillation: Secondary | ICD-10-CM | POA: Diagnosis not present

## 2015-06-26 DIAGNOSIS — G2581 Restless legs syndrome: Secondary | ICD-10-CM

## 2015-06-26 LAB — POCT INR
INR: 2.5
INR: 3.7

## 2015-06-26 NOTE — Telephone Encounter (Signed)
Pt would like a referral to see  Vascular Vein Specialist:   Deitra Mayo MD   Address: [address], [place], [place] [address]  Phone: [phone]  For swelling in legs, neuropathy, poss RLS.

## 2015-06-26 NOTE — Progress Notes (Signed)
Pre visit review using our clinic review tool, if applicable. No additional management support is needed unless otherwise documented below in the visit note. 

## 2015-06-26 NOTE — Telephone Encounter (Signed)
For neuropathy and RLS, neurology referral would be more appropriate- if he is wishing to get another opinion regarding treatment of his neuropathy and RLS.

## 2015-06-26 NOTE — Telephone Encounter (Signed)
Last seen on 06/09/2015. Please advise if referral is appropriate.

## 2015-06-27 NOTE — Telephone Encounter (Signed)
Pt declines an appt with Neurology. He would like to see a Vascular Vein Specialist bc we wants to know if the blood flow in his legs are causing these sx. Please advise.

## 2015-06-27 NOTE — Telephone Encounter (Signed)
Left message for patient to call back  

## 2015-06-27 NOTE — Telephone Encounter (Signed)
Pt is returning autumn call  °

## 2015-06-27 NOTE — Telephone Encounter (Signed)
We can set up to see VVS, but I'm not sure they will even see him for RLS and neuropathy issues.  Unless he is having claudication issues with walking I don't know that they will see him.

## 2015-06-28 NOTE — Telephone Encounter (Signed)
Pt is aware of annotations---order entered for referral.

## 2015-06-30 ENCOUNTER — Other Ambulatory Visit: Payer: Self-pay | Admitting: *Deleted

## 2015-06-30 DIAGNOSIS — R600 Localized edema: Secondary | ICD-10-CM

## 2015-07-12 ENCOUNTER — Other Ambulatory Visit: Payer: Self-pay | Admitting: Family Medicine

## 2015-07-13 ENCOUNTER — Encounter: Payer: Medicare Other | Admitting: Vascular Surgery

## 2015-07-13 ENCOUNTER — Encounter (HOSPITAL_COMMUNITY): Payer: Medicare Other

## 2015-07-20 ENCOUNTER — Other Ambulatory Visit: Payer: Self-pay | Admitting: General Practice

## 2015-07-20 ENCOUNTER — Other Ambulatory Visit: Payer: Self-pay | Admitting: Family Medicine

## 2015-07-20 MED ORDER — WARFARIN SODIUM 5 MG PO TABS: 5.0000 mg | ORAL_TABLET | Freq: Every day | ORAL | Status: DC

## 2015-07-24 ENCOUNTER — Ambulatory Visit (INDEPENDENT_AMBULATORY_CARE_PROVIDER_SITE_OTHER): Payer: Medicare Other | Admitting: General Practice

## 2015-07-24 DIAGNOSIS — I480 Paroxysmal atrial fibrillation: Secondary | ICD-10-CM

## 2015-07-24 LAB — POCT INR: INR: 2.9

## 2015-07-24 NOTE — Progress Notes (Signed)
Pre visit review using our clinic review tool, if applicable. No additional management support is needed unless otherwise documented below in the visit note. 

## 2015-08-09 ENCOUNTER — Telehealth: Payer: Self-pay | Admitting: Cardiovascular Disease

## 2015-08-09 NOTE — Telephone Encounter (Signed)
Informed Dr. Wandra Mannan office that [facility] does not monitor patient's coumadin, his PCP does. Provided PCP phone number and fax number.  Confirmed with representative that cardiac clearance is not necessary.  She was grateful for assistance.   Forwarded to Meriam Sprague, RN.

## 2015-08-09 NOTE — Telephone Encounter (Signed)
Hi Sunday Spillers,  Please fax the form to [phone].  Thanks!  Meriam Sprague, RN

## 2015-08-09 NOTE — Telephone Encounter (Signed)
Request for surgical clearance:  1. What type of surgery is being performed? Hydrocelectomy   2. When is this surgery scheduled?Not scheduled   3. Are there any medications that need to be held prior to surgery and how long?Can pt stop Coumadin 5 days prior to surgery?   4. Name of physician performing surgery?Dr Donia Ast    5. What is your office phone and fax number? [phone] and fax number is [phone]

## 2015-08-10 ENCOUNTER — Encounter: Payer: Self-pay | Admitting: Vascular Surgery

## 2015-08-10 ENCOUNTER — Telehealth: Payer: Self-pay

## 2015-08-10 NOTE — Telephone Encounter (Signed)
Dr. Elease Hashimoto,  Patient is going to have a hydrocelectomy.  OK to stop coumadin for 5 days?  Lovenox bridge?  Thanks, [employer]

## 2015-08-10 NOTE — Telephone Encounter (Signed)
Can you reach out to pt and schedule him a Surgical Clearance for Right Hydrocelectomy. Then forward back to me. Thank you.

## 2015-08-10 NOTE — Telephone Encounter (Signed)
Yes

## 2015-08-10 NOTE — Telephone Encounter (Addendum)
Pt has been sch for 08-22-15. Pt is on cancellation wait list.

## 2015-08-16 ENCOUNTER — Encounter: Payer: Medicare Other | Admitting: Vascular Surgery

## 2015-08-16 ENCOUNTER — Ambulatory Visit (HOSPITAL_COMMUNITY): Payer: Medicare Other

## 2015-08-17 ENCOUNTER — Other Ambulatory Visit: Payer: Self-pay | Admitting: Family Medicine

## 2015-08-18 ENCOUNTER — Telehealth: Payer: Self-pay | Admitting: *Deleted

## 2015-08-18 ENCOUNTER — Other Ambulatory Visit: Payer: Self-pay | Admitting: Family Medicine

## 2015-08-18 NOTE — Telephone Encounter (Signed)
[facility] in [place], [place] [phone]) faxed a refill request for Lidocaine ointment and this medication is not on the current medication list.  I called the pt to see if he is using this and to see the provider that prescribed this for him and he stated he does not take this medication and does not know anything about this pharmacy.  Request was faxed back to [facility] at [phone] stating the refill is not appropriate as the pt does not take this.

## 2015-08-22 ENCOUNTER — Encounter: Payer: Self-pay | Admitting: Family Medicine

## 2015-08-22 ENCOUNTER — Ambulatory Visit (INDEPENDENT_AMBULATORY_CARE_PROVIDER_SITE_OTHER): Payer: Medicare Other | Admitting: Family Medicine

## 2015-08-22 VITALS — BP 98/50 | HR 86 | Temp 98.3°F | Ht 70.0 in | Wt 241.0 lb

## 2015-08-22 DIAGNOSIS — E1165 Type 2 diabetes mellitus with hyperglycemia: Secondary | ICD-10-CM

## 2015-08-22 DIAGNOSIS — E1140 Type 2 diabetes mellitus with diabetic neuropathy, unspecified: Secondary | ICD-10-CM | POA: Diagnosis not present

## 2015-08-22 DIAGNOSIS — Z01818 Encounter for other preprocedural examination: Secondary | ICD-10-CM

## 2015-08-22 DIAGNOSIS — I480 Paroxysmal atrial fibrillation: Secondary | ICD-10-CM

## 2015-08-22 NOTE — Progress Notes (Signed)
Pre visit review using our clinic review tool, if applicable. No additional management support is needed unless otherwise documented below in the visit note. 

## 2015-08-22 NOTE — Patient Instructions (Signed)
Use Humalog with sliding scale and use at biggest meal of day (eg dinner) Would hold Toujeo insulin day of surgery and then start back the following day.

## 2015-08-22 NOTE — Progress Notes (Signed)
Subjective:     Patient ID: Gary Mooers., male   DOB: January 30, 1933, 82 y.o.   MRN: [id number]  HPI Patient seen for presurgical visit. He has planned hydrocele surgery with date be established. Chronic problems include history of CAD, peripheral disease, hypertension, dyslipidemia, atrial fibrillation, COPD, type 2 diabetes, peripheral neuropathy, chronic kidney disease stage III He had fairly extensive back surgery last year and has been recovering well from that. He has improved compliance tremendously with diet and has lost a total of 50 pounds. His blood sugars are improving. Most recent A1c 7.4%. Remains on Coumadin. He has follow up with cardiology next week.  Most recent echocardiogram normal EF. Nuclear stress test January 2015 no major ischemia noted. No recent chest pains. No dyspnea but he is very sedentary. No dizziness. He has some chronic bilateral leg edema which is unchanged. No orthopnea.  Past Medical History:  Diagnosis Date  . Anxiety   . Atrial fibrillation ([place])   . COPD (chronic obstructive pulmonary disease) ([place])    pt. denies  . Coronary artery disease    a. h/o Overlapping stents RCA;  b. 06/2011 Cath: patent stents, nonobs dzs, NL EF.  . Diabetic peripheral neuropathy ([place])   . DM (diabetes mellitus) ([place])    Type 2, peripheral neuropathy.  . Dyspnea    with exertion  . Dysrhythmia   . GERD (gastroesophageal reflux disease)   . History of bronchitis   . History of kidney stones   . Hyperlipidemia   . Hypertension   . Low testosterone   . Nephrolithiasis   . Osteoarthritis    shoulder  . Restless leg   . SVT (supraventricular tachycardia) ([place])   . Urinary frequency    Past Surgical History:  Procedure Laterality Date  . [place]  . CARDIAC CATHETERIZATION  01/2013  . CHOLECYSTECTOMY    . COLONOSCOPY    . CORONARY ANGIOPLASTY  2004  . EYE SURGERY Bilateral    cataracts  . LEFT HEART CATHETERIZATION WITH CORONARY ANGIOGRAM N/A  06/18/2011   Procedure: LEFT HEART CATHETERIZATION WITH CORONARY ANGIOGRAM;  Surgeon: Peter M Martinique, MD;  Location: [facility] CATH LAB;  Service: Cardiovascular;  Laterality: N/A;  . LEFT HEART CATHETERIZATION WITH CORONARY ANGIOGRAM N/A 01/27/2013   Procedure: LEFT HEART CATHETERIZATION WITH CORONARY ANGIOGRAM;  Surgeon: Burnell Blanks, MD;  Location: [facility] CATH LAB;  Service: Cardiovascular;  Laterality: N/A;  . LUMBAR LAMINECTOMY/DECOMPRESSION MICRODISCECTOMY N/A 02/07/2015   Procedure: Lumbar three-Sacral one Decompression;  Surgeon: Kevan Ny Ditty, MD;  Location: [place] NEURO ORS;  Service: Neurosurgery;  Laterality: N/A;  L3 to S1 Decompression  . ROTATOR CUFF REPAIR Left     reports that he quit smoking about 32 years ago. His smoking use included Cigarettes. He has a 30.00 pack-year smoking history. He has never used smokeless tobacco. He reports that he does not drink alcohol or use drugs. family history includes Alzheimer's disease in his mother; Arthritis in his brother and sister; Heart disease in his brother, father, mother, and sister; Migraines in his daughter and father; Obesity in his sister and son; Sleep apnea in his son; Thyroid disease in his daughter; Ulcers in his father. Allergies  Allergen Reactions  . Ace Inhibitors Other (See Comments)    cough  . Codeine Nausea Only and Rash       . Penicillins Rash    Childhood allergy      Review of Systems  Constitutional: Negative for fatigue.  Eyes: Negative for visual disturbance.  Respiratory: Negative for cough, chest tightness and shortness of breath.   Cardiovascular: Positive for leg swelling. Negative for chest pain and palpitations.  Neurological: Negative for dizziness, syncope, weakness, light-headedness and headaches.       Objective:   Physical Exam  Constitutional: He is oriented to person, place, and time. He appears well-developed and well-nourished.  Neck: Neck supple.  Cardiovascular: Normal rate and  regular rhythm.   Pulmonary/Chest: Effort normal and breath sounds normal. No respiratory distress. He has no wheezes. He has no rales.  Musculoskeletal: He exhibits edema.  Trace nonpitting edema legs bilaterally  Neurological: He is alert and oriented to person, place, and time. No cranial nerve deficit.       Assessment:     #1 history of CAD. Denies any recent chest symptoms. He has follow-up with cardiology next week  #2 history of atrial fibrillation. Clinically, appears to be in sinus rhythm today. We deferred on checking EKG as he already has scheduled follow-up with cardiology as above  #3 type 2 diabetes. Improving with recent weight loss efforts. We reviewed proper insulin use today. Currently takes long-acting insulin (Toujeo) 20 units once daily and using Humalog only one meal per day. He has recently been taking this with his smaller meal at breakfast and we have encouraged taking this with dinner with sliding scale    Plan:     - Follow-up in September as scheduled and check A1c then  -Encouraged follow-up with cardiology next week as part of his surgical clearance  -Continue weight loss efforts  -We have advised that he not take Toujeo morning of his surgery.  Will need close monitoring of sugars post-surgery.  [name] MD [facility] at [facility]

## 2015-08-24 ENCOUNTER — Other Ambulatory Visit: Payer: Self-pay | Admitting: Urology

## 2015-08-24 ENCOUNTER — Other Ambulatory Visit: Payer: Self-pay | Admitting: Family Medicine

## 2015-08-28 ENCOUNTER — Ambulatory Visit (INDEPENDENT_AMBULATORY_CARE_PROVIDER_SITE_OTHER): Payer: Medicare Other | Admitting: Cardiovascular Disease

## 2015-08-28 ENCOUNTER — Encounter: Payer: Self-pay | Admitting: Cardiovascular Disease

## 2015-08-28 VITALS — BP 102/60 | HR 69 | Ht 70.0 in | Wt 242.8 lb

## 2015-08-28 DIAGNOSIS — I2510 Atherosclerotic heart disease of native coronary artery without angina pectoris: Secondary | ICD-10-CM

## 2015-08-28 DIAGNOSIS — I480 Paroxysmal atrial fibrillation: Secondary | ICD-10-CM | POA: Diagnosis not present

## 2015-08-28 DIAGNOSIS — Z01810 Encounter for preprocedural cardiovascular examination: Secondary | ICD-10-CM

## 2015-08-28 NOTE — Patient Instructions (Addendum)
Gary Yates.  08/28/2015   Your procedure is scheduled on: Friday 09/01/2015  Report to [facility] Main  Entrance take [facility]  elevators to 3rd floor to  [place] at  [place]  AM.  Call this number if you have problems the morning of surgery [phone]   Remember: ONLY 1 PERSON MAY GO WITH YOU TO SHORT STAY TO GET  READY MORNING OF [place].   Do not eat food or drink liquids :After Midnight.     Take these medicines the morning of surgery with A SIP OF WATER: GABAPENTIN, METOPROLOL, PROTONIX               DO NOT TAKE ANY DIABETIC MEDICATIONS DAY OF YOUR SURGERY!                               You may not have any metal on your body including hair pins and              piercings  Do not wear jewelry, make-up, lotions, powders or perfumes, deodorant             Do not wear nail polish.  Do not shave  48 hours prior to surgery.              Men may shave face and neck.   Do not bring valuables to the hospital. [place].  Contacts, dentures or bridgework may not be worn into surgery.  Leave suitcase in the car. After surgery it may be brought to your room.     Patients discharged the day of surgery will not be allowed to drive home.  Name and phone number of your driver:spouse- Inez Catalina and son- Keoki Lanoue  Special Instructions: N/A              Please read over the following fact sheets you were given: _____________________________________________________________________             [facility] - Preparing for Surgery Before surgery, you can play an important role.  Because skin is not sterile, your skin needs to be as free of germs as possible.  You can reduce the number of germs on your skin by washing with CHG (chlorahexidine gluconate) soap before surgery.  CHG is an antiseptic cleaner which kills germs and bonds with the skin to continue killing germs even after washing. Please DO NOT use if  you have an allergy to CHG or antibacterial soaps.  If your skin becomes reddened/irritated stop using the CHG and inform your nurse when you arrive at Short Stay. Do not shave (including legs and underarms) for at least 48 hours prior to the first CHG shower.  You may shave your face/neck. Please follow these instructions carefully:  1.  Shower with CHG Soap the night before surgery and the  morning of Surgery.  2.  If you choose to wash your hair, wash your hair first as usual with your  normal  shampoo.  3.  After you shampoo, rinse your hair and body thoroughly to remove the  shampoo.  4.  Use CHG as you would any other liquid soap.  You can apply chg directly  to the skin and wash                       Gently with a scrungie or clean washcloth.  5.  Apply the CHG Soap to your body ONLY FROM THE NECK DOWN.   Do not use on face/ open                           Wound or open sores. Avoid contact with eyes, ears mouth and genitals (private parts).                       Wash face,  Genitals (private parts) with your normal soap.             6.  Wash thoroughly, paying special attention to the area where your surgery  will be performed.  7.  Thoroughly rinse your body with warm water from the neck down.  8.  DO NOT shower/wash with your normal soap after using and rinsing off  the CHG Soap.                9.  Pat yourself dry with a clean towel.            10.  Wear clean pajamas.            11.  Place clean sheets on your bed the night of your first shower and do not  sleep with pets. Day of Surgery : Do not apply any lotions/deodorants the morning of surgery.  Please wear clean clothes to the hospital/surgery center.  FAILURE TO FOLLOW THESE INSTRUCTIONS MAY RESULT IN THE CANCELLATION OF YOUR SURGERY PATIENT SIGNATURE_________________________________  NURSE  SIGNATURE__________________________________  ________________________________________________________________________

## 2015-08-28 NOTE — Patient Instructions (Signed)

## 2015-08-28 NOTE — Progress Notes (Signed)
=   Chief Complaint  Patient presents with  . Dizziness     History of Present Illness: 82 yo WM with history of HTN, hyperlipidemia, morbid obesity, DM, CAD, atrial fibrillation, carotid artery disease and COPD who is here for follow up. He was admitted to [facility] 06/18/11 with chest pain. Cardiac cath on 06/18/11 with moderate LAD disease with patent RCA stents. No flow limiting lesions. Normal LVEF. He was found to be in atrial fibrillation. He was started on Xarelto but he has since changed to coumadin due to cost. Slurred speech and seen by Neuro. Head CT without evidence of CVA. Head MRI with questionable tiny acute infarct posterior left frontal lobe, mild small vessel disease type changes, global atrophy without hydrocephalus. He is known to have very mild bilateral carotid artery disease (last dopplers November 2016). He was seen 12/23/12 and had c/o chest pressure when working in the yard. Stress myoview 01/13/13 with small inferior wall defect from base to apex with mild peri-infarct ischemia. LVEF=53%. Cath 01/27/13 with stable disease. Admitted to [facility] 02/03/13 with chest pain. Mild elevation troponin. Started on Imdur and Plavix. Carotid dopplers with mild bilateral disease.   He is here today for follow up. He has a planned urological procedure this week. He has no recent chest pains or dyspnea. He has lost 40 lbs. Chronic lower ect edema. No syncope. He has chronic balance issues.   Primary Care Physician: [name], MD   Past Medical History:  Diagnosis Date  . Anxiety   . Atrial fibrillation ([place])   . COPD (chronic obstructive pulmonary disease) ([place])    pt. denies  . Coronary artery disease    a. h/o Overlapping stents RCA;  b. 06/2011 Cath: patent stents, nonobs dzs, NL EF.  . Diabetic peripheral neuropathy ([place])   . DM (diabetes mellitus) ([place])    Type 2, peripheral neuropathy.  . Dyspnea    with exertion  . Dysrhythmia   . GERD (gastroesophageal reflux  disease)   . History of bronchitis   . History of kidney stones   . Hyperlipidemia   . Hypertension   . Low testosterone   . Nephrolithiasis   . Osteoarthritis    shoulder  . Restless leg   . SVT (supraventricular tachycardia) ([place])   . Urinary frequency     Past Surgical History:  Procedure Laterality Date  . [place]  . CARDIAC CATHETERIZATION  01/2013  . CHOLECYSTECTOMY    . COLONOSCOPY    . CORONARY ANGIOPLASTY  2004  . EYE SURGERY Bilateral    cataracts  . LEFT HEART CATHETERIZATION WITH CORONARY ANGIOGRAM N/A 06/18/2011   Procedure: LEFT HEART CATHETERIZATION WITH CORONARY ANGIOGRAM;  Surgeon: Peter M Martinique, MD;  Location: [facility] CATH LAB;  Service: Cardiovascular;  Laterality: N/A;  . LEFT HEART CATHETERIZATION WITH CORONARY ANGIOGRAM N/A 01/27/2013   Procedure: LEFT HEART CATHETERIZATION WITH CORONARY ANGIOGRAM;  Surgeon: Burnell Blanks, MD;  Location: [facility] CATH LAB;  Service: Cardiovascular;  Laterality: N/A;  . LUMBAR LAMINECTOMY/DECOMPRESSION MICRODISCECTOMY N/A 02/07/2015   Procedure: Lumbar three-Sacral one Decompression;  Surgeon: Kevan Ny Ditty, MD;  Location: [facility]URO ORS;  Service: Neurosurgery;  Laterality: N/A;  L3 to S1 Decompression  . ROTATOR CUFF REPAIR Left     Current Outpatient Prescriptions  Medication Sig Dispense Refill  . ACCU-CHEK AVIVA PLUS test strip TEST 3 TIMES A DAY 100 each 3  . atorvastatin (LIPITOR) 40 MG tablet TAKE 1 TABLET BY  MOUTH DAILY 90 tablet 1  . clonazePAM (KLONOPIN) 1 MG tablet TAKE 1 TABLET EVERY DAY AT BEDTIME AS NEEDED 30 tablet 5  . clopidogrel (PLAVIX) 75 MG tablet TAKE 1 TABLET DAILY WITH BREAKFAST 30 tablet 5  . fenofibrate micronized (LOFIBRA) 134 MG capsule Take 1 capsule by mouth  daily before breakfast 90 capsule 1  . gabapentin (NEURONTIN) 300 MG capsule Take 2 capsules by mouth 3  times a day (Patient taking differently: one capsule twice daily) 540 capsule 1  . glucose blood (ACCU-CHEK AVIVA  PLUS) test strip TEST 3 TIMES A DAY 300 each 3  . Insulin Glargine (TOUJEO SOLOSTAR) 300 UNIT/ML SOPN Inject 20 Units into the skin daily.    . insulin lispro (HUMALOG) 100 UNIT/ML injection Take 1 times daily with meals as per sliding scale.    . metFORMIN (GLUCOPHAGE) 500 MG tablet TAKE 1 TABLET (500 MG TOTAL) BY MOUTH 2 (TWO) TIMES DAILY WITH A MEAL. 60 tablet 5  . metoprolol succinate (TOPROL-XL) 100 MG 24 hr tablet TAKE 1 TABLET EVERY DAY IMMEDIATELY FOLLOWING A MEAL 90 tablet 1  . nitroGLYCERIN (NITROSTAT) 0.4 MG SL tablet Place 1 tablet (0.4 mg total) under the tongue every 5 (five) minutes as needed. Chest pain 25 tablet 6  . oxybutynin (DITROPAN XL) 15 MG 24 hr tablet     . pantoprazole (PROTONIX) 40 MG tablet TAKE 1 TABLET EVERY DAY 90 tablet 2  . pramipexole (MIRAPEX) 1.5 MG tablet MAY TAKE 1 TO 1 &1/2 TABLETS BY MOUTH AT BEDTIME 60 tablet 5  . tamsulosin (FLOMAX) 0.4 MG CAPS capsule Take 0.4 mg by mouth daily as needed. For fluid    . Trospium Chloride 60 MG CP24     . vitamin B-12 (CYANOCOBALAMIN) 1000 MCG tablet Take 1 tablet (1,000 mcg total) by mouth daily. 30 tablet 0  . warfarin (COUMADIN) 5 MG tablet Take 1 tablet (5 mg total) by mouth daily. Except on Mon, Wed, Fri take 7.5 mg 40 tablet 1   No current facility-administered medications for this visit.    Facility-Administered Medications Ordered in Other Visits  Medication Dose Route Frequency Provider Last Rate Last Dose  . testosterone cypionate (DEPOTESTOTERONE CYPIONATE) injection 200 mg  200 mg Intramuscular Q28 days [name], MD   200 mg at 12/26/11 0850  . testosterone cypionate (DEPOTESTOTERONE CYPIONATE) injection 200 mg  200 mg Intramuscular Q28 days [name], MD   200 mg at 01/23/12 0908  . testosterone cypionate (DEPOTESTOTERONE CYPIONATE) injection 200 mg  200 mg Intramuscular Q28 days [name], MD   200 mg at 02/25/12 0848  . testosterone cypionate (DEPOTESTOTERONE CYPIONATE) injection  200 mg  200 mg Intramuscular Q28 days [name], MD   200 mg at 06/03/12 1437    Allergies  Allergen Reactions  . Ace Inhibitors Other (See Comments)    cough  . Codeine Nausea Only and Rash       . Penicillins Rash    Childhood allergy Has patient had a PCN reaction causing immediate rash, facial/tongue/throat swelling, SOB or lightheadedness with hypotension: Yes Has patient had a PCN reaction causing severe rash involving mucus membranes or skin necrosis: Yes Has patient had a PCN reaction that required hospitalization No Has patient had a PCN reaction occurring within the last 10 years: No If all of the above answers are "NO", then may proceed with Cephalosporin use.     Social History   Social History  . Marital status:  Married    Spouse name: N/A  . Number of children: 3  . Years of education: N/A   Occupational History  . Retired Retired   Social History Main Topics  . Smoking status: Former Smoker    Packs/day: 1.50    Years: 20.00    Types: Cigarettes    Quit date: 04/05/1983  . Smokeless tobacco: Never Used  . Alcohol use No  . Drug use: No  . Sexual activity: Not Currently   Other Topics Concern  . Not on file   Social History Narrative  . No narrative on file    Family History  Problem Relation Age of Onset  . Alzheimer's disease Mother   . Heart disease Mother   . Heart disease Father   . Migraines Father   . Ulcers Father   . Coronary artery disease      Male 1st degree relative <50  . Coronary artery disease      male 1st degree relative <60  . Heart disease Sister   . Obesity Sister     Morbid  . Arthritis Sister   . Heart disease Brother   . Arthritis Brother   . Sleep apnea Son   . Obesity Son   . Migraines Daughter   . Thyroid disease Daughter     Review of Systems:  As stated in the HPI and otherwise negative.   BP 102/60 (BP Location: Right Arm, Patient Position: Sitting, Cuff Size: Large)   Pulse 69   Ht 5\' 10"   (1.778 m)   Wt 242 lb 12.8 oz (110.1 kg)   BMI 34.84 kg/m   Physical Examination: General: Well developed, well nourished, NAD  HEENT: OP clear, mucus membranes moist  SKIN: warm, dry. No rashes. Neuro: No focal deficits  Musculoskeletal: Muscle strength 5/5 all ext  Psychiatric: Mood and affect normal  Neck: No JVD, no carotid bruits, no thyromegaly, no lymphadenopathy.  Lungs:Clear bilaterally, no wheezes, rhonci, crackles Cardiovascular: Regular rate and rhythm. No murmurs, gallops or rubs. Abdomen:Soft. Bowel sounds present. Non-tender.  Extremities: No lower extremity edema. Pulses are 2 + in the bilateral DP/PT.  Stress myoview 01/13/13: Stress Procedure: The patient received IV Lexiscan 0.4 mg over 15-seconds. Technetium 58m Sestamibi injected at 30-seconds. Quantitative spect images were obtained after a 45 minute delay. During the infusion of Lexiscan, the patient complained of a "strange" feeling and a headache. These symptoms resolved in recovery.  Stress ECG: No significant change from baseline ECG  QPS  Raw Data Images: Normal; no motion artifact; normal heart/lung ratio.  Stress Images: There is decreased uptake in the inferior wall.  Rest Images: There is decreased uptake in the inferior wall.  Subtraction (SDS): mixed infarct and ischemia  Transient Ischemic Dilatation (Normal <1.22): 0.98  Lung/Heart Ratio (Normal <0.45): 0.40  Quantitative Gated Spect Images  QGS EDV: 126 ml  QGS ESV: 54 ml  Impression  Exercise Capacity: Lexiscan with no exercise.  BP Response: Normal blood pressure response.  Clinical Symptoms: Headache  ECG Impression: No significant ST segment change suggestive of ischemia.  Comparison with Prior Nuclear Study: No images to compare  Overall Impression: Low risk stress nuclear study Small inferior wall infarct from apex to base with mild peri infarct ischemia.  LV Ejection Fraction: 57%. LV Wall Motion: NL LV Function; NL Wall  Motion  Cardiac cath 01/27/13: Left main: No obstructive disease.  Left Anterior Descending Artery: Large caliber vessel in the proximal segment with trifurcation in the mid  segment into a large septal perforating branch, moderate caliber bifurcating diagonal branch and small to moderate caliber continuation of the LAD. There appears to be a 40-50% stenosis at the trifurcation in the mid LAD. The diagonal branch bifurcates. The superior branch of the diagonal after the bifurcation is small in caliber with 70% stenosis.  Circumflex Artery: Large caliber vessel with large caliber obtuse marginal branch. The proximal and mid vessel has diffuse 30% stenosis, unchanged. The obtuse marginal branch has diffuse 30% stenosis in the proximal segment of the vessel.  Right Coronary Artery: Large caliber dominant vessel with patent proximal and mid stented segment with 20% stent restenosis. Mild plaque in the large caliber PDA.   Echo 01/26/14: Left ventricle: The cavity size was normal. Systolic function was normal. The estimated ejection fraction was in the range of 55% to 60%. Wall motion was normal; there were no regional wall motion abnormalities. Doppler parameters are consistent with abnormal left ventricular relaxation (grade 1 diastolic dysfunction). - Left atrium: The atrium was mildly dilated. - Atrial septum: No defect or patent foramen ovale was identified.  EKG:  EKG is ordered today. The ekg ordered today demonstrates NSR, rate 69 bpm. RBBB. LAFB.   Recent Labs: 03/01/2015: Hemoglobin 11.4; Platelets 248.0 05/10/2015: ALT 11; BUN 18; Creatinine, Ser 1.20; Potassium 4.2; Sodium 137   Lipid Panel    Component Value Date/Time   CHOL 139 05/10/2015 1212   TRIG 151.0 (H) 05/10/2015 1212   HDL 32.40 (L) 05/10/2015 1212   CHOLHDL 4 05/10/2015 1212   VLDL 30.2 05/10/2015 1212   LDLCALC 76 05/10/2015 1212   LDLDIRECT 85.0 12/05/2014 1126     Wt Readings from Last 3 Encounters:   08/28/15 242 lb 12.8 oz (110.1 kg)  08/22/15 241 lb (109.3 kg)  06/09/15 246 lb 1.6 oz (111.6 kg)     Other studies Reviewed: Additional studies/ records that were reviewed today include: . Review of the above records demonstrates:    Assessment and Plan:   1. CORONARY ARTERY DISEASE: He has no recent chest pain concerning for unstable angina. He is known to have moderate CAD wth last cardiac cath in 2015 with stable disease in the LAD and patent RCA stents. Echo 01/26/14 with normal LV function. Continue Plavix, beta blocker, statin.  No ASA since he is also on coumadin.   2. Paroxysmal atrial fibrillation: He is maintaining NSR. He did not want to start Xarelto due to cost. He has been maintained on coumadin therapy. Will continue Toprol at current dose.   3. Sleep apnea: He snores loudly at night, has excessive daytime fatigue with somnolence but trouble sleeping at night. Referral to Pulmonary but pt did not keep this appt.   4.  Pre-operative cardiovascular examination: He has no recent chest pains or dyspnea to suggest angina. He is not active due to balance issues but he is doing well overall. EKG is unchanged. Based on current guidelines, he can proceed with his planned surgical procedure. He will hold coumadin and Plavix starting today for his procedure.   Current medicines are reviewed at length with the patient today.  The patient does not have concerns regarding medicines.  The following changes have been made:    Labs/ tests ordered today include:   Orders Placed This Encounter  Procedures  . EKG 12-Lead    Disposition:   FU with me in 6 months  Signed, Lauree Chandler, MD 08/28/2015 9:21 AM    [id number]  [address], [place], [place]  [address] Phone: [phone]; Fax: [phone]

## 2015-08-29 ENCOUNTER — Encounter (HOSPITAL_COMMUNITY): Payer: Self-pay

## 2015-08-29 ENCOUNTER — Encounter (HOSPITAL_COMMUNITY)
Admission: RE | Admit: 2015-08-29 | Discharge: 2015-08-29 | Disposition: A | Discharge status: Home / Self Care | Payer: Medicare Other | Source: Ambulatory Visit | Attending: Urology | Admitting: Urology

## 2015-08-29 DIAGNOSIS — G4730 Sleep apnea, unspecified: Secondary | ICD-10-CM | POA: Diagnosis not present

## 2015-08-29 DIAGNOSIS — J449 Chronic obstructive pulmonary disease, unspecified: Secondary | ICD-10-CM | POA: Diagnosis not present

## 2015-08-29 DIAGNOSIS — I4891 Unspecified atrial fibrillation: Secondary | ICD-10-CM | POA: Diagnosis not present

## 2015-08-29 DIAGNOSIS — I10 Essential (primary) hypertension: Secondary | ICD-10-CM | POA: Diagnosis not present

## 2015-08-29 DIAGNOSIS — Z87891 Personal history of nicotine dependence: Secondary | ICD-10-CM | POA: Diagnosis not present

## 2015-08-29 DIAGNOSIS — I071 Rheumatic tricuspid insufficiency: Secondary | ICD-10-CM | POA: Diagnosis not present

## 2015-08-29 DIAGNOSIS — M1990 Unspecified osteoarthritis, unspecified site: Secondary | ICD-10-CM | POA: Diagnosis not present

## 2015-08-29 DIAGNOSIS — Z6836 Body mass index (BMI) 36.0-36.9, adult: Secondary | ICD-10-CM | POA: Diagnosis not present

## 2015-08-29 DIAGNOSIS — Z794 Long term (current) use of insulin: Secondary | ICD-10-CM | POA: Diagnosis not present

## 2015-08-29 DIAGNOSIS — Z7901 Long term (current) use of anticoagulants: Secondary | ICD-10-CM | POA: Diagnosis not present

## 2015-08-29 DIAGNOSIS — I2510 Atherosclerotic heart disease of native coronary artery without angina pectoris: Secondary | ICD-10-CM | POA: Diagnosis not present

## 2015-08-29 DIAGNOSIS — E1151 Type 2 diabetes mellitus with diabetic peripheral angiopathy without gangrene: Secondary | ICD-10-CM | POA: Diagnosis not present

## 2015-08-29 DIAGNOSIS — N433 Hydrocele, unspecified: Secondary | ICD-10-CM | POA: Diagnosis present

## 2015-08-29 DIAGNOSIS — Z79899 Other long term (current) drug therapy: Secondary | ICD-10-CM | POA: Diagnosis not present

## 2015-08-29 LAB — CBC
HCT: 34.4 % — ABNORMAL LOW (ref 39.0–52.0)
Hemoglobin: 11.3 g/dL — ABNORMAL LOW (ref 13.0–17.0)
MCH: 29.6 pg (ref 26.0–34.0)
MCHC: 32.8 g/dL (ref 30.0–36.0)
MCV: 90.1 fL (ref 78.0–100.0)
PLATELETS: 234 10*3/uL (ref 150–400)
RBC: 3.82 MIL/uL — AB (ref 4.22–5.81)
RDW: 14.6 % (ref 11.5–15.5)
WBC: 5.1 10*3/uL (ref 4.0–10.5)

## 2015-08-29 LAB — PROTIME-INR
INR: 2.77
PROTHROMBIN TIME: 29.9 s — AB (ref 11.4–15.2)

## 2015-08-29 LAB — BASIC METABOLIC PANEL
ANION GAP: 7 (ref 5–15)
BUN: 20 mg/dL (ref 6–20)
CHLORIDE: 103 mmol/L (ref 101–111)
CO2: 25 mmol/L (ref 22–32)
Calcium: 9.5 mg/dL (ref 8.9–10.3)
Creatinine, Ser: 1.07 mg/dL (ref 0.61–1.24)
Glucose, Bld: 178 mg/dL — ABNORMAL HIGH (ref 65–99)
POTASSIUM: 4 mmol/L (ref 3.5–5.1)
SODIUM: 135 mmol/L (ref 135–145)

## 2015-08-29 NOTE — Progress Notes (Signed)
   08/29/15 1028  OBSTRUCTIVE SLEEP APNEA  Have you ever been diagnosed with sleep apnea through a sleep study? No  Do you snore loudly (loud enough to be heard through closed doors)?  0  Do you often feel tired, fatigued, or sleepy during the daytime (such as falling asleep during driving or talking to someone)? 1  Has anyone observed you stop breathing during your sleep? 0  Do you have, or are you being treated for high blood pressure? 1  BMI more than 35 kg/m2? 0  Age > 50 (1-yes) 1  Neck circumference greater than:Male 16 inches or larger, Male 17inches or larger? 1  Male Gender (Yes=1) 1  Obstructive Sleep Apnea Score 5  Score 5 or greater  Results sent to PCP

## 2015-08-29 NOTE — Progress Notes (Signed)
Consulted Dr. Montez Hageman, Anesthesia about patient's EKG from 01/19/2014 , 03/08/2014, 08/28/2015. Per Dr. Marcell Barlow, patient OK for surgery!

## 2015-08-30 LAB — HEMOGLOBIN A1C
HEMOGLOBIN A1C: 7.4 % — AB (ref 4.8–5.6)
Mean Plasma Glucose: 166 mg/dL

## 2015-08-31 ENCOUNTER — Encounter (HOSPITAL_COMMUNITY): Payer: Self-pay | Admitting: *Deleted

## 2015-08-31 NOTE — Anesthesia Preprocedure Evaluation (Addendum)
Anesthesia Evaluation  Patient identified by MRN, date of birth, ID band Patient awake    Reviewed: Allergy & Precautions, NPO status , Patient's Chart, lab work & pertinent test results  History of Anesthesia Complications Negative for: history of anesthetic complications  Airway Mallampati: II  TM Distance: <3 FB Neck ROM: Full  Mouth opening: Limited Mouth Opening  Dental  (+) Edentulous Upper, Edentulous Lower   Pulmonary shortness of breath, COPD, former smoker,    breath sounds clear to auscultation       Cardiovascular hypertension, + CAD, + Cardiac Stents and + Peripheral Vascular Disease  + dysrhythmias  Rhythm:Regular Rate:Normal  Gary Yates.  2D Echocardiogram without contrast  Order# [id number]  Ordering physician: Burnell Blanks, MD Study date: 01/26/14 Result Notes   Notes Recorded by Thompson Grayer, RN on 01/26/2014 at 4:44 PM Pt notified ------  Notes Recorded by Burnell Blanks, MD on 01/26/2014 at 3:59 PM Echo is overall normal. cdm   Study Result   Result status: Final result             Gary Y[facility]            [address], [place] [address]              [phone]  ------------------------------------------------------------------- Transthoracic Echocardiography  Patient:  Gary Yates, Gary Yates MR #:    [id number] Study Date: 01/26/2014 Gender:   M Age:    82 Height:   177.8 cm Weight:   115.2 kg BSA:    2.43 m^2 Pt. Status: Room:  SONOGRAPHER Victorio Palm, [facility] ATTENDING  Default, Provider [phone] Willia Craze, Christopher REFERRING  [name], [place], Outpatient  cc:  ------------------------------------------------------------------- LV EF: 55% -   60%  ------------------------------------------------------------------- Indications:   CAD (I25.10). Dyspnea (R06.00).  ------------------------------------------------------------------- History:  PMH: Acquired from the patient and from the patient&'s chart. Dyspnea. Atrial fibrillation. Supraventricular tachycardia. Coronary artery disease. Risk factors: Former tobacco use. Hypertension. Diabetes mellitus. Morbidly obese. Dyslipidemia.  ------------------------------------------------------------------- Study Conclusions  - Left ventricle: The cavity size was normal. Systolic function was normal. The estimated ejection fraction was in the range of 55% to 60%. Wall motion was normal; there were no regional wall motion abnormalities. Doppler parameters are consistent with abnormal left ventricular relaxation (grade 1 diastolic dysfunction). - Left atrium: The atrium was mildly dilated. - Atrial septum: No defect or patent foramen ovale was identified.  ------------------------------------------------------------------- Labs, prior tests, procedures, and surgery: Echocardiography (June 2013).   EF was 50%.  Catheterization.  The study demonstrated coronary artery disease. There was a stenosis which was treated with a stent. Transthoracic echocardiography. M-mode, complete 2D, spectral Doppler, and color Doppler. Birthdate: Patient birthdate: 1933-09-21. Age: Patient is 82 yr old. Sex: Gender: male. BMI: 36.4 kg/m^2. Blood pressure:   120/75 Patient status: Outpatient. Study date: Study date: 01/26/2014. Study time: 02:11 PM. Location: [place] [facility]  -------------------------------------------------------------------  ------------------------------------------------------------------- Left ventricle: The cavity size was normal. Systolic function was normal. The estimated ejection fraction was in the range of 55% to 60%. Wall  motion was normal; there were no regional wall motion abnormalities. Doppler parameters are consistent with abnormal left ventricular relaxation (grade 1 diastolic dysfunction).  ------------------------------------------------------------------- Aortic valve:  Trileaflet; normal thickness, mildly calcified leaflets. Mobility was not restricted. Doppler: Transvalvular velocity was within the normal range. There was no stenosis. There was no regurgitation.  -------------------------------------------------------------------  Aorta: Aortic root: The aortic root was normal in size.  ------------------------------------------------------------------- Mitral valve:  Mildly thickened leaflets . Mobility was not restricted. Doppler: Transvalvular velocity was within the normal range. There was no evidence for stenosis. There was trivial regurgitation.  ------------------------------------------------------------------- Left atrium: The atrium was mildly dilated.  ------------------------------------------------------------------- Atrial septum: No defect or patent foramen ovale was identified.  ------------------------------------------------------------------- Right ventricle: The cavity size was normal. Wall thickness was normal. Systolic function was normal.  ------------------------------------------------------------------- Pulmonic valve:  Doppler: Transvalvular velocity was within the normal range. There was no evidence for stenosis. There was trivial regurgitation.  ------------------------------------------------------------------- Tricuspid valve:  Structurally normal valve.  Doppler: Transvalvular velocity was within the normal range. There was trivial regurgitation.  ------------------------------------------------------------------- Pulmonary artery:  The main pulmonary artery was normal-sized. Systolic pressure was within the normal  range.  ------------------------------------------------------------------- Right atrium: The atrium was normal in size.  ------------------------------------------------------------------- Pericardium: The pericardium was normal in appearance. There was no pericardial effusion.  ------------------------------------------------------------------- Systemic veins: Inferior vena cava: The vessel was normal in size.  ------------------------------------------------------------------- Measurements  Left ventricle              Value    Reference LV ID, ED, PLAX chordal         49.9 mm   43 - 52 LV ID, ES, PLAX chordal         35.1 mm   23 - 38 LV fx shortening, PLAX chordal      30  %   >=29 LV PW thickness, ED           12.6 mm   --------- IVS/LV PW ratio, ED           0.77     <=1.3 Stroke volume, 2D            90  ml   --------- Stroke volume/bsa, 2D          37  ml/m^2 --------- LV e&', lateral              5.59 cm/s  --------- LV E/e&', lateral             12.45    --------- LV e&', medial              6.03 cm/s  --------- LV E/e&', medial             11.54    --------- LV e&', average              5.81 cm/s  --------- LV E/e&', average             11.98    ---------  Ventricular septum            Value    Reference IVS thickness, ED            9.68 mm   ---------  LVOT                   Value    Reference LVOT ID, S                23  mm   --------- LVOT area                4.15 cm^2  --------- LVOT ID                 23  mm   --------- LVOT peak velocity, S          99.3  cm/s  --------- LVOT mean velocity, S          71   cm/s  --------- LVOT VTI, S               21.6 cm   --------- Stroke volume (SV), LVOT DP       89.7 ml   --------- Stroke index (SV/bsa), LVOT DP      36.9 ml/m^2 ---------  Aorta                  Value    Reference Aortic root ID, ED            33  mm   ---------  Left atrium               Value    Reference LA ID, A-P, ES              42  mm   --------- LA ID/bsa, A-P              1.73 cm/m^2 <=2.2 LA volume, S               52  ml   --------- LA volume/bsa, S             21.4 ml/m^2 --------- LA volume, ES, 1-p A4C          55  ml   --------- LA volume/bsa, ES, 1-p A4C        22.6 ml/m^2 --------- LA volume, ES, 1-p A2C          46  ml   --------- LA volume/bsa, ES, 1-p A2C        18.9 ml/m^2 ---------  Mitral valve               Value    Reference Mitral E-wave peak velocity       69.6 cm/s  --------- Mitral A-wave peak velocity       111  cm/s  --------- Mitral deceleration time    (H)    264  ms   150 - 230 Mitral E/A ratio, peak          0.6     ---------  Right ventricle             Value    Reference RV s&', lateral, S            12.7 cm/s  ---------  Legend: (L) and (H) mark values outside specified reference range.  ------------------------------------------------------------------- Prepared and Electronically Authenticated by  Jenkins Rouge, M.D. 2016-01-20T15:19:54 Patient Information   Patient Name Gary Yates. Sex Male DOB 11-02-1933 SSN [id number] Reason For Exam  Priority: Routine  Dx: Atherosclerosis of native coronary artery of native heart without angina pectoris (I25.10 (ICD-10-CM)); Dyspnea (R06.00 (ICD-10-CM)) Surgical History    Surgical History    Procedure Laterality Date Comment Source CARDIAC CATHETERIZATION  01/2013  Provider CORONARY ANGIOPLASTY  2004  Provider  Other Surgical History    Procedure Laterality Date Comment Source BACK SURGERY  1986 & 1987  Provider CHOLECYSTECTOMY    Provider COLONOSCOPY    Provider EYE SURGERY Bilateral  cataracts Provider LEFT HEART CATHETERIZATION WITH CORONARY ANGIOGRAM N/A 06/18/2011 Procedure: LEFT HEART CATHETERIZATION WITH CORONARY ANGIOGRAM; Surgeon: Peter M Martinique, MD; Location: [facility] CATH LAB; Service: Cardiovascular; Laterality: N/A; Provider LEFT HEART CATHETERIZATION WITH CORONARY ANGIOGRAM N/A 01/27/2013 Procedure: LEFT HEART CATHETERIZATION WITH CORONARY ANGIOGRAM; Surgeon: Burnell Blanks, MD; Location: [facility] CATH LAB; Service: Cardiovascular;  Laterality: N/A; Provider LUMBAR LAMINECTOMY/DECOMPRESSION MICRODISCECTOMY N/A 02/07/2015 Procedure: Lumbar three-Sacral one Decompression; Surgeon: Kevan Ny Ditty, MD; Location: [place] NEURO ORS; Service: Neurosurgery; Laterality: N/A; L3 to S1 Decompression Provider ROTATOR CUFF REPAIR Left   Provider  Performing Technologist/Nurse   Performing Technologist/Nurse:  Implants     No active implants to display in this view. Order-Level Documents:   There are no order-level documents.  Encounter-Level Documents - 01/26/2014:   Scan on 02/04/2014 1:57 PM by Provider Default, MD  Electronic signature on 01/26/2014 1:25 PM  Electronic signature on 01/26/2014 1:24 PM    Printable Result Report   Result Report 2D Echocardiogram without contrast (Order [id number])  Echocardiography  Date: 01/26/2014 Department: [place] Released By: Alveda Reasons Authorizing: Burnell Blanks, MD Result Notes   Notes Recorded by Thompson Grayer, RN on 01/26/2014 at 4:44 PM Pt notified ------  Notes Recorded by Burnell Blanks, MD on 01/26/2014 at 3:59 PM Echo is  overall normal. cdm   Order Information   Order Date/Time Release Date/Time Start Date/Time End Date/Time 01/19/14 12:33 PM 01/26/14 01:26 PM 01/26/14 01:26 PM None Order Details   Frequency Duration Priority Order Class None None Routine Ancillary Performed Acc# [id number] Order Questions   Question Answer Comment Type of Echo Complete  Where should this test be performed [facility])  Reason for exam-Echo Dyspnea 786.09 / R06.00    Associated Diagnoses    ICD-9-CM ICD-10-CM Atherosclerosis of native coronary artery of native heart without angina pectoris   414.01 I25.10 Dyspnea   786.09 R06.00 Authorizing Provider [address]   Date/Time Authorizing Provider Changed by 01/26/2014 1:26 PM Burnell Blanks, MD Thompson Grayer, RN Order Requisition   2D ECHOCARDIOGRAM WITHOUT CONTRAST (Order [id number]) on 01/26/14 Collection Information   Collected: 01/26/2014 2:11 PM   Resulting Agency: [place]   2D ECHOCARDIOGRAM WITHOUT CONTRAST (Order [id number]) on 01/26/14      Neuro/Psych  Neuromuscular disease    GI/Hepatic Neg liver ROS, GERD  ,  Endo/Other  diabetes, Type 1  Renal/GU      Musculoskeletal  (+) Arthritis ,   Abdominal (+) + obese,   Peds  Hematology   Anesthesia Other Findings   Reproductive/Obstetrics                            Anesthesia Physical Anesthesia Plan  ASA: III  Anesthesia Plan: General   Post-op Pain Management:    Induction: Intravenous  Airway Management Planned: LMA  Additional Equipment:   Intra-op Plan:   Post-operative Plan: Extubation in OR  Informed Consent: I have reviewed the patients History and Physical, chart, labs and discussed the procedure including the risks, benefits and alternatives for the proposed anesthesia with the patient or authorized representative who has indicated his/her understanding and acceptance.    Dental advisory given  Plan Discussed with:   Anesthesia Plan Comments:         Anesthesia Quick Evaluation

## 2015-09-01 ENCOUNTER — Encounter (HOSPITAL_COMMUNITY): Payer: Self-pay | Admitting: *Deleted

## 2015-09-01 ENCOUNTER — Ambulatory Visit (HOSPITAL_COMMUNITY): Payer: Medicare Other | Admitting: Anesthesiology

## 2015-09-01 ENCOUNTER — Encounter (HOSPITAL_COMMUNITY)
Admission: RE | Disposition: A | Discharge status: Home / Self Care | Payer: Self-pay | Source: Ambulatory Visit | Attending: Urology

## 2015-09-01 ENCOUNTER — Ambulatory Visit (HOSPITAL_COMMUNITY)
Admission: RE | Admit: 2015-09-01 | Discharge: 2015-09-01 | Disposition: A | Discharge status: Home / Self Care | Payer: Medicare Other | Source: Ambulatory Visit | Attending: Urology | Admitting: Urology

## 2015-09-01 DIAGNOSIS — I10 Essential (primary) hypertension: Secondary | ICD-10-CM | POA: Insufficient documentation

## 2015-09-01 DIAGNOSIS — I071 Rheumatic tricuspid insufficiency: Secondary | ICD-10-CM | POA: Insufficient documentation

## 2015-09-01 DIAGNOSIS — I4891 Unspecified atrial fibrillation: Secondary | ICD-10-CM | POA: Insufficient documentation

## 2015-09-01 DIAGNOSIS — Z79899 Other long term (current) drug therapy: Secondary | ICD-10-CM | POA: Insufficient documentation

## 2015-09-01 DIAGNOSIS — I2510 Atherosclerotic heart disease of native coronary artery without angina pectoris: Secondary | ICD-10-CM | POA: Insufficient documentation

## 2015-09-01 DIAGNOSIS — M1990 Unspecified osteoarthritis, unspecified site: Secondary | ICD-10-CM | POA: Insufficient documentation

## 2015-09-01 DIAGNOSIS — N509 Disorder of male genital organs, unspecified: Secondary | ICD-10-CM

## 2015-09-01 DIAGNOSIS — Z87891 Personal history of nicotine dependence: Secondary | ICD-10-CM | POA: Insufficient documentation

## 2015-09-01 DIAGNOSIS — Z6836 Body mass index (BMI) 36.0-36.9, adult: Secondary | ICD-10-CM | POA: Insufficient documentation

## 2015-09-01 DIAGNOSIS — Z7901 Long term (current) use of anticoagulants: Secondary | ICD-10-CM | POA: Diagnosis not present

## 2015-09-01 DIAGNOSIS — N433 Hydrocele, unspecified: Secondary | ICD-10-CM | POA: Diagnosis not present

## 2015-09-01 DIAGNOSIS — Z794 Long term (current) use of insulin: Secondary | ICD-10-CM | POA: Insufficient documentation

## 2015-09-01 DIAGNOSIS — E1151 Type 2 diabetes mellitus with diabetic peripheral angiopathy without gangrene: Secondary | ICD-10-CM | POA: Insufficient documentation

## 2015-09-01 DIAGNOSIS — G4730 Sleep apnea, unspecified: Secondary | ICD-10-CM | POA: Insufficient documentation

## 2015-09-01 DIAGNOSIS — J449 Chronic obstructive pulmonary disease, unspecified: Secondary | ICD-10-CM | POA: Insufficient documentation

## 2015-09-01 HISTORY — PX: ORCHIECTOMY: SHX2116

## 2015-09-01 LAB — GLUCOSE, CAPILLARY
Glucose-Capillary: 162 mg/dL — ABNORMAL HIGH (ref 65–99)
Glucose-Capillary: 170 mg/dL — ABNORMAL HIGH (ref 65–99)

## 2015-09-01 LAB — PROTIME-INR
INR: 1.43
PROTHROMBIN TIME: 17.6 s — AB (ref 11.4–15.2)

## 2015-09-01 SURGERY — ORCHIECTOMY
Anesthesia: General | Site: Scrotum | Laterality: Right

## 2015-09-01 MED ORDER — LIDOCAINE HCL (CARDIAC) 20 MG/ML IV SOLN
INTRAVENOUS | Status: DC | PRN
  Administered 2015-09-01: 100 mg via INTRAVENOUS

## 2015-09-01 MED ORDER — CEFAZOLIN SODIUM-DEXTROSE 2-4 GM/100ML-% IV SOLN
2.0000 g | INTRAVENOUS | Status: AC
  Administered 2015-09-01: 2 g via INTRAVENOUS

## 2015-09-01 MED ORDER — PROPOFOL 10 MG/ML IV BOLUS
INTRAVENOUS | Status: DC | PRN
  Administered 2015-09-01: 160 mg via INTRAVENOUS

## 2015-09-01 MED ORDER — ONDANSETRON HCL 4 MG/2ML IJ SOLN
INTRAMUSCULAR | Status: AC
  Filled 2015-09-01: qty 2

## 2015-09-01 MED ORDER — HYDROCODONE-ACETAMINOPHEN 10-325 MG PO TABS: 1.0000 | ORAL_TABLET | ORAL | 0 refills | Status: DC | PRN

## 2015-09-01 MED ORDER — 0.9 % SODIUM CHLORIDE (POUR BTL) OPTIME
TOPICAL | Status: DC | PRN
  Administered 2015-09-01: 1000 mL

## 2015-09-01 MED ORDER — HYDROCODONE-ACETAMINOPHEN 10-325 MG PO TABS: 1.0000 | ORAL_TABLET | Freq: Once | ORAL | Status: DC

## 2015-09-01 MED ORDER — LIDOCAINE HCL (CARDIAC) 20 MG/ML IV SOLN
INTRAVENOUS | Status: AC
  Filled 2015-09-01: qty 5

## 2015-09-01 MED ORDER — CEFAZOLIN SODIUM-DEXTROSE 2-4 GM/100ML-% IV SOLN
INTRAVENOUS | Status: AC
  Filled 2015-09-01: qty 100

## 2015-09-01 MED ORDER — PROPOFOL 10 MG/ML IV BOLUS
INTRAVENOUS | Status: AC
  Filled 2015-09-01: qty 20

## 2015-09-01 MED ORDER — ONDANSETRON HCL 4 MG/2ML IJ SOLN
INTRAMUSCULAR | Status: DC | PRN
  Administered 2015-09-01: 4 mg via INTRAVENOUS

## 2015-09-01 MED ORDER — LACTATED RINGERS IV SOLN: INTRAVENOUS | Status: DC

## 2015-09-01 MED ORDER — FENTANYL CITRATE (PF) 100 MCG/2ML IJ SOLN
INTRAMUSCULAR | Status: DC | PRN
  Administered 2015-09-01: 50 ug via INTRAVENOUS
  Administered 2015-09-01 (×2): 25 ug via INTRAVENOUS

## 2015-09-01 MED ORDER — LACTATED RINGERS IV SOLN
INTRAVENOUS | Status: DC | PRN
  Administered 2015-09-01: 07:00:00 via INTRAVENOUS

## 2015-09-01 MED ORDER — BACITRACIN-NEOMYCIN-POLYMYXIN 400-5-5000 EX OINT
TOPICAL_OINTMENT | CUTANEOUS | Status: AC
  Filled 2015-09-01: qty 1

## 2015-09-01 MED ORDER — FENTANYL CITRATE (PF) 100 MCG/2ML IJ SOLN: 25.0000 ug | INTRAMUSCULAR | Status: DC | PRN

## 2015-09-01 MED ORDER — FENTANYL CITRATE (PF) 100 MCG/2ML IJ SOLN
INTRAMUSCULAR | Status: AC
  Filled 2015-09-01: qty 2

## 2015-09-01 MED ORDER — BUPIVACAINE-EPINEPHRINE 0.25% -1:200000 IJ SOLN
INTRAMUSCULAR | Status: DC | PRN
  Administered 2015-09-01: 10 mL

## 2015-09-01 MED ORDER — BUPIVACAINE-EPINEPHRINE (PF) 0.25% -1:200000 IJ SOLN
INTRAMUSCULAR | Status: AC
  Filled 2015-09-01: qty 30

## 2015-09-01 SURGICAL SUPPLY — 30 items
BENZOIN TINCTURE PRP APPL 2/3 (GAUZE/BANDAGES/DRESSINGS) IMPLANT
BNDG GAUZE ELAST 4 BULKY (GAUZE/BANDAGES/DRESSINGS) IMPLANT
COVER SURGICAL LIGHT HANDLE (MISCELLANEOUS) ×3 IMPLANT
DRAIN PENROSE 18X1/4 LTX STRL (WOUND CARE) ×3 IMPLANT
DRAPE LAPAROTOMY T 98X78 PEDS (DRAPES) ×3 IMPLANT
ELECT REM PT RETURN 9FT ADLT (ELECTROSURGICAL) ×3
ELECTRODE REM PT RTRN 9FT ADLT (ELECTROSURGICAL) ×2 IMPLANT
GAUZE SPONGE 4X4 12PLY STRL (GAUZE/BANDAGES/DRESSINGS) IMPLANT
GLOVE BIOGEL M 8.0 STRL (GLOVE) ×3 IMPLANT
GOWN STRL REUS W/TWL XL LVL3 (GOWN DISPOSABLE) ×3 IMPLANT
KIT BASIN OR (CUSTOM PROCEDURE TRAY) ×3 IMPLANT
LIQUID BAND (GAUZE/BANDAGES/DRESSINGS) ×3 IMPLANT
NEEDLE HYPO 22GX1.5 SAFETY (NEEDLE) ×3 IMPLANT
NS IRRIG 1000ML POUR BTL (IV SOLUTION) ×3 IMPLANT
PACK GENERAL/GYN (CUSTOM PROCEDURE TRAY) ×3 IMPLANT
STRIP CLOSURE SKIN 1/2X4 (GAUZE/BANDAGES/DRESSINGS) IMPLANT
SUPPORT SCROTAL LG STRP (MISCELLANEOUS) ×3 IMPLANT
SUT CHROMIC 3 0 SH 27 (SUTURE) ×3 IMPLANT
SUT MNCRL AB 4-0 PS2 18 (SUTURE) ×3 IMPLANT
SUT SILK 0 (SUTURE) ×1
SUT SILK 0 30XBRD TIE 6 (SUTURE) ×2 IMPLANT
SUT SILK 2 0 (SUTURE) ×2
SUT SILK 2-0 18XBRD TIE 12 (SUTURE) ×4 IMPLANT
SUT SILK 3 0 SH 30 (SUTURE) ×3 IMPLANT
SUT VIC AB 2-0 SH 27 (SUTURE) ×1
SUT VIC AB 2-0 SH 27X BRD (SUTURE) ×2 IMPLANT
SUT VIC AB 3-0 SH 27 (SUTURE)
SUT VIC AB 3-0 SH 27XBRD (SUTURE) IMPLANT
SYR CONTROL 10ML LL (SYRINGE) ×3 IMPLANT
TOWEL OR 17X26 10 PK STRL BLUE (TOWEL DISPOSABLE) ×6 IMPLANT

## 2015-09-01 NOTE — Anesthesia Procedure Notes (Signed)
Procedure Name: LMA Insertion Date/Time: 09/01/2015 7:36 AM Performed by: Noralyn Pick D Pre-anesthesia Checklist: Patient identified, Emergency Drugs available, Suction available and Patient being monitored Patient Re-evaluated:Patient Re-evaluated prior to inductionOxygen Delivery Method: Circle system utilized Preoxygenation: Pre-oxygenation with 100% oxygen Intubation Type: IV induction Ventilation: Mask ventilation without difficulty LMA Size: 4.0 Tube type: Oral Number of attempts: 1 Placement Confirmation: ETT inserted through vocal cords under direct vision,  positive ETCO2 and breath sounds checked- equal and bilateral Tube secured with: Tape Dental Injury: Teeth and Oropharynx as per pre-operative assessment

## 2015-09-01 NOTE — H&P (Signed)
Gary Yates is a 82 year-old male established patient with a right hydrocele.  He first noticed his hydrocele 2 months ago. His hydrocele is on the right side. The patient has not had a scrotal ultrasound. He does have pain on the side of his hydrocele. His hydrocele does cause restriction of normal activities.   He has had the following surgeries: Hernia Repair. The patient denies having the following scrotal surgeries: Vasectomy, Hydrocele Repair, Undescended Testis Surgery, and Varicocele Repair. He has not had a testicular infection. His hydrocele does bother him enough to consider surgical repair.   He said it is continuing to enlarge, it is getting in the way, and he said it is uncomfortable when he crosses his legs, sits or walks.     ALLERGIES: ACE Inhibitors Codeine Derivatives Penicillins    MEDICATIONS: ClonazePAM 1 MG Oral Tablet Oral  Fenofibrate Micronized 134 MG Oral Capsule Oral  Gabapentin 300 MG Oral Capsule Oral  Isosorbide Mononitrate ER 30 MG Oral Tablet Extended Release 24 Hour Oral  Metoprolol Succinate ER 100 MG Oral Tablet Extended Release 24 Hour Oral  Nitroglycerin 0.4 MG Sublingual Tablet Sublingual Sublingual  NovoLOG 100 UNIT/ML Subcutaneous Solution Subcutaneous  Oxybutynin Chloride ER 15 MG Oral Tablet Extended Release 24 Hour 0 Oral Daily  Pramipexole Dihydrochloride 1.5 MG Oral Tablet Oral  Protonix 40 MG Oral Tablet Delayed Release Oral  Tamsulosin HCl - 0.4 MG Oral Capsule 0 Oral  Toujeo SoloStar 300 UNIT/ML Subcutaneous Solution Pen-injector Subcutaneous  Trospium Chloride ER 60 MG Oral Capsule Extended Release 24 Hour 0 Oral  Warfarin Sodium 5 MG Oral Tablet Oral     GU PSH: Hernia Repair - 11/25/2013 Renal ESWL - 2008      PSH Notes: Cath Stent Placement, Appendectomy, Back Surgery, Hernia Repair, Cholecystectomy, Lithotripsy, Knee Surgery, Shoulder Surgery   NON-GU PSH: Appendectomy - 11/25/2013 Cholecystectomy - 11/25/2013    GU PMH:  Overactive bladder, Overactive bladder - 01/04/2014 Urge incontinence, Urge incontinence of urine - 01/04/2014 Urinary Retention, Unspec, Incomplete bladder emptying - 01/04/2014 Balanitis, Balanitis - 11/25/2013 ED, arterial insufficiency, Erectile dysfunction due to arterial insufficiency - 11/25/2013      PMH Notes: Nephrolithiasis: He was diagnosed with a left ureteral stone in 10/06 and at that time indicated. Past 3 stones spontaneously previously. He underwent stent placement and then lithotripsy of his stone.  Stone analysis: Calcium oxalate  24 hour urine: The only abnormality was a low 24 hour volume of 850 cc.   Bilateral renal cyst: These have been noted on CT scan previously.   Prostate nodularity: On examination he was noted by Dr. Serita Butcher to have "mild prostate nodularity" his PSAs remained normal.   Incontinence: He reported that for several months he has had difficulty with progressive urgency with associated urge incontinence. This occurs 4-5 times per day and is associated with nocturia 3-4 times as well as nocturnal enuresis.  Urodynamics 12/09/13: Although he had 500 cc in his bladder initially he had no urge to urinate and then was found to have a diminished functional capacity with instability. The pressure flow study however revealed an interrupted pattern with poorly sustained contractions and an elevated PVR.      NON-GU PMH: Encounter for general adult medical examination without abnormal findings, Encounter for preventive health examination - 12/09/2013 Personal history of other diseases of the circulatory system, History of atrial fibrillation - 11/25/2013, History of cardiac arrhythmia, - 11/25/2013, History of cardiac disorder, - 11/25/2013, History of hypertension, - 2014 Personal history  of other diseases of the nervous system and sense organs, History of sleep apnea - 2014 Personal history of other endocrine, nutritional and metabolic disease, History of  diabetes mellitus - 2014, History of hypercholesterolemia, - 2014    FAMILY HISTORY: cardiac disorder - Runs In Family Family Health Status Number - Runs In Family   SOCIAL HISTORY: Marital Status: Married Current Smoking Status: Patient does not smoke anymore.  Has never drank.  Drinks 2 caffeinated drinks per day.     Notes: Alcohol use, Mother deceased, Retired, Caffeine use, Former smoker, Father deceased, Number of children, Tobacco Use, Marital History - Currently Married   REVIEW OF SYSTEMS:    GU Review Male:   Patient reports hard to postpone urination, get up at night to urinate, leakage of urine, and erection problems. Patient denies frequent urination, burning/ pain with urination, stream starts and stops, trouble starting your stream, have to strain to urinate , and penile pain.  Gastrointestinal (Upper):   Patient denies nausea, vomiting, and indigestion/ heartburn.  Gastrointestinal (Lower):   Patient denies diarrhea and constipation.  Constitutional:   Patient reports fatigue. Patient denies fever, night sweats, and weight loss.  Skin:   Patient denies skin rash/ lesion and itching.  Eyes:   Patient denies double vision and blurred vision.  Ears/ Nose/ Throat:   Patient denies sore throat and sinus problems.  Hematologic/Lymphatic:   Patient reports swollen glands. Patient denies easy bruising.  Cardiovascular:   Patient reports leg swelling. Patient denies chest pains.  Respiratory:   Patient reports cough and shortness of breath.   Endocrine:   Patient reports excessive thirst.   Musculoskeletal:   Patient denies back pain and joint pain.  Neurological:   Patient denies headaches and dizziness.  Psychologic:   Patient denies depression and anxiety.   VITAL SIGNS:    Weight 246 lb / 111.58 kg  Height 70 in / 177.8 cm  BP 122/56 mmHg  Pulse 83 /min  BMI 35.3 kg/m   PAST DATA REVIEWED:  Source Of History:  Patient, Outside Source  Lab Test Review:   PSA,  BUN/Creatinine  Records Review:   Previous Patient Records, POC Tool   02/14/06 08/16/05  PSA  Total PSA 0.94  1.28     02/14/06  Hormones  Testosterone, Total 1.95    Notes:                     I note his PSA in 8/11 was 0.87. More recently his creatinine in 5/17 was 1.20.   PROCEDURES:          Urinalysis - 81003 Dipstick Dipstick Cont'd  Specimen: Voided Bilirubin: Neg  Color: Yellow Ketones: Neg  Appearance: Clear Blood: Neg  Specific Gravity: 1.025 Protein: Trace  pH: 5.5 Urobilinogen: 0.2  Glucose: Neg Nitrites: Neg    Leukocyte Esterase: Neg    ASSESSMENT:    This right make every attempt to the different disfigurement right Notes right mammographic He has a moderate-sized right hydrocele. We discussed the treatment options and I first told him that this would cause him no long-term harm in and of itself. We discussed aspiration but I did not recommend this form of therapy and he would like this treated so we discussed hydrocelectomy. I discussed the incision used, the risks and complications, the alternatives, the outpatient nature of the procedure as well as the anticipated postoperative course. He understands and is elected to proceed.  Notes:   He is currently on Coumadin. He stopped it most recently 7 months ago for back surgery. We discussed the fact that he will need to come off of it again for 5 days prior to his upcoming surgery. I told him he could then restart the Coumadin 24-48 hours after surgery but I would let him know in his postoperative instructions.    PLAN:    Right hydrocelectomy

## 2015-09-01 NOTE — Discharge Instructions (Signed)
Scrotal surgery postoperative instructions  Wound:  In most cases your incision will have absorbable sutures that will dissolve within the first 10-20 days. Some will fall out even earlier. Expect some redness as the sutures dissolved but this should occur only around the sutures. If there is generalized redness, especially with increasing pain or swelling, let us know. The scrotum will very likely get "black and blue" as the blood in the tissues spread. Sometimes the whole scrotum will turn colors. The black and blue is followed by a yellow and brown color. In time, all the discoloration will go away. In some cases some firm swelling in the area of the testicle may persist for up to 4-6 weeks after the surgery and is considered normal in most cases.  Diet:  You may return to your normal diet within 24 hours following your surgery. You may note some mild nausea and possibly vomiting the first 6-8 hours following surgery. This is usually due to the side effects of anesthesia, and will disappear quite soon. I would suggest clear liquids and a very light meal the first evening following your surgery.  Activity:  Your physical activity should be restricted the first 48 hours. During that time you should remain relatively inactive, moving about only when necessary. During the first 7-10 days following surgery he should avoid lifting any heavy objects (anything greater than 15 pounds), and avoid strenuous exercise. If you work, ask [place] specifically about your restrictions, both for work and home. We will write a note to your employer if needed.  You should plan to wear a tight pair of jockey shorts or an athletic supporter for the first 4-5 days, even to sleep. This will keep the scrotum immobilized to some degree and keep the swelling down.  Ice packs should be placed on and off over the scrotum for the first 48 hours. Frozen peas or corn in a ZipLock bag can be frozen, used and re-frozen. Fifteen minutes  on and 15 minutes off is a reasonable schedule. The ice is a good pain reliever and keeps the swelling down.  Hygiene:  You may shower 48 hours after your surgery. Tub bathing should be restricted until the seventh day.          Medication:  You will be sent home with some type of pain medication. In many cases you will be sent home with a narcotic pain pill (hydrococone or oxycodone). If the pain is not too bad, you may take either Tylenol (acetaminophen) or Advil (ibuprofen) which contain no narcotic agents, and might be tolerated a little better, with fewer side effects. If the pain medication you are sent home with does not control the pain, you will have to let us know. Some narcotic pain medications cannot be given or refilled by a phone call to a pharmacy.  Problems you should report to [place]:   Fever of 101.0 degrees Fahrenheit or greater.  Moderate or severe swelling under the skin incision or involving the scrotum.  Drug reaction such as hives, a rash, nausea or vomiting.   General Anesthesia, Adult, Care After Refer to this sheet in the next few weeks. These instructions provide you with information on caring for yourself after your procedure. Your health care provider may also give you more specific instructions. Your treatment has been planned according to current medical practices, but problems sometimes occur. Call your health care provider if you have any problems or questions after your procedure. WHAT TO EXPECT AFTER THE  PROCEDURE After the procedure, it is typical to experience:  Sleepiness.  Nausea and vomiting. HOME CARE INSTRUCTIONS  For the first 24 hours after general anesthesia:  Have a responsible person with you.  Do not drive a car. If you are alone, do not take public transportation.  Do not drink alcohol.  Do not take medicine that has not been prescribed by your health care provider.  Do not sign important papers or make important  decisions.  You may resume a normal diet and activities as directed by your health care provider.                       If you have questions or problems that seem related to general anesthesia, call the hospital and ask for the anesthetist or anesthesiologist on call. SEEK MEDICAL CARE IF:  You have nausea and vomiting that continue the day after anesthesia.  You develop a rash. SEEK IMMEDIATE MEDICAL CARE IF:   You have difficulty breathing.  You have chest pain.  You have any allergic problems.   This information is not intended to replace advice given to you by your health care provider. Make sure you discuss any questions you have with your health care provider.   Document Released: 04/01/2000 Document Revised: 01/14/2014 Document Reviewed: 04/24/2011 Elsevier Interactive Patient Education [employer].

## 2015-09-01 NOTE — Anesthesia Postprocedure Evaluation (Signed)
Anesthesia Post Note  Patient: Gary Yates.  Procedure(s) Performed: Procedure(s) (LRB): RIGHT ORCHIECTOMY (Right)  Patient location during evaluation: PACU Anesthesia Type: General Level of consciousness: awake and alert Pain management: pain level controlled Vital Signs Assessment: post-procedure vital signs reviewed and stable Respiratory status: spontaneous breathing, nonlabored ventilation, respiratory function stable and patient connected to nasal cannula oxygen Cardiovascular status: blood pressure returned to baseline and stable Postop Assessment: no signs of nausea or vomiting Anesthetic complications: no    Last Vitals:  Vitals:   09/01/15 0900 09/01/15 0911  BP: (!) 144/80 135/65  Pulse: 76 74  Resp: 15 14  Temp: 36.3 C 36.4 C    Last Pain:  Vitals:   09/01/15 0900  TempSrc:   PainSc: 0-No pain                 Skylarr Liz,JAMES TERRILL

## 2015-09-01 NOTE — Transfer of Care (Signed)
Immediate Anesthesia Transfer of Care Note  Patient: Gary Yates.  Procedure(s) Performed: Procedure(s): RIGHT ORCHIECTOMY (Right)  Patient Location: PACU  Anesthesia Type:General  Level of Consciousness: awake, alert  and oriented  Airway & Oxygen Therapy: Patient Spontanous Breathing and Patient connected to face mask oxygen  Post-op Assessment: Report given to RN and Post -op Vital signs reviewed and stable  Post vital signs: Reviewed and stable  Last Vitals:  Vitals:   09/01/15 0540  BP: 124/71  Pulse: 76  Resp: 18  Temp: 37.4 C    Last Pain:  Vitals:   09/01/15 0540  TempSrc: Oral      Patients Stated Pain Goal: 4 ([id number])  Complications: No apparent anesthesia complications

## 2015-09-01 NOTE — Op Note (Signed)
PATIENT:  Gary Yates.  PRE-OPERATIVE DIAGNOSIS: Suspected right hydrocele  POST-OPERATIVE DIAGNOSIS: 1. Small right hydrocele 2. Abnormal right testicle  PROCEDURE: 1. Scrotal exploration 2. Drainage of right hydrocele 3. Right orchiectomy  SURGEON:  Claybon Jabs  INDICATION: Gary Yates. is a 82 year old male who had a fairly acute right hemiscrotal swelling. It was causing some discomfort and was getting in the way. He was examined and felt to have a right hydrocele. We discussed surgical correction and he is brought to the operating room for right hydrocelectomy.  ANESTHESIA:  General  EBL:  Minimal  DRAINS: None  LOCAL MEDICATIONS USED:  10 mL of quarter percent Marcaine with epinephrine  SPECIMEN:  Right testicle and epididymis with portion of spermatic cord to pathology  Description of procedure: After informed consent the patient was taken to the operating room and placed on the table in a supine position. General anesthesia was then administered. Once fully anesthetized the genitalia were sterilely prepped and draped in standard fashion. An official timeout was then performed.  I made a median raphae midline scrotal incision and carried this down over the right any scrotal swelling. It is seemed to be more hyperemic then usual. I then incised the hydrocele sac and noted it was somewhat thickened with a dark amber/bloody fluid. This was evacuated and the testicle was examined and noted to be enlarged, firm and markedly distorted. There appeared to be some extension of this firm process extending cephalad along the cord but it ended abruptly and normal cord was identified.  Because of this grossly abnormal testicle and the pain the patient was experiencing I broke scrub and went out and spoke to the patient's wife, daughter and son. I discussed with them my intraoperative findings and the fact that there was a small hydrocele but he has a grossly abnormal testicle and this  raises the concern for possible malignancy. The other possibilities we discussed work inflammatory and infectious causes. Regardless I felt that an orchiectomy was indicated and I discussed that with them in detail. His wife, son and daughter gave full consent for me to proceed with a right simple orchiectomy.  I scrubbed back in and dissected up the cord to where it appeared entirely normal. Kelly clamps were then used to isolate the cord into 2 equal portions and the clamps were placed across the cord and clamped. I divided the cord distal to the clamps and the specimen was freed and sent to pathology.  I then doubly ligated the cord segments first with a 2-0 silk tie followed by a 2-0 Vicryl suture ligature. I then copiously irrigated the scrotum and inspected for any obvious bleeding points. It seemed fairly hyperemic but I was able to obtain adequate hemostasis. I then closed the deep scrotal tissue with a running, locking 3-0 chromic suture. Local anesthetic was then injected in the subcutaneous tissue and the skin was then closed with a running 3-0 chromic..  I applied Neosporin, a sterile 4 x 4, fluff Kerlix and then a scrotal support and the patient was awakened and taken to the recovery room in stable and satisfactory condition. He tolerated procedure well no intraoperative complications. Needle, sponge and instrument counts were correct 2 at the end of the operation.  PLAN OF CARE: Discharge to home after PACU  PATIENT DISPOSITION:  PACU - hemodynamically stable.

## 2015-09-12 ENCOUNTER — Ambulatory Visit: Payer: Medicare Other | Admitting: Family Medicine

## 2015-09-12 ENCOUNTER — Other Ambulatory Visit: Payer: Self-pay | Admitting: Hematology & Oncology

## 2015-09-12 DIAGNOSIS — C8589 Other specified types of non-Hodgkin lymphoma, extranodal and solid organ sites: Secondary | ICD-10-CM

## 2015-09-20 ENCOUNTER — Ambulatory Visit (HOSPITAL_COMMUNITY)
Admission: RE | Admit: 2015-09-20 | Discharge: 2015-09-20 | Disposition: A | Discharge status: Home / Self Care | Payer: Medicare Other | Source: Ambulatory Visit | Attending: Hematology & Oncology | Admitting: Hematology & Oncology

## 2015-09-20 DIAGNOSIS — Z0189 Encounter for other specified special examinations: Secondary | ICD-10-CM

## 2015-09-20 DIAGNOSIS — C8589 Other specified types of non-Hodgkin lymphoma, extranodal and solid organ sites: Secondary | ICD-10-CM

## 2015-09-20 NOTE — Progress Notes (Signed)
  Echocardiogram 2D Echocardiogram has been performed.  Gary Yates 09/20/2015, 11:48 AM

## 2015-09-21 ENCOUNTER — Encounter: Payer: Self-pay | Admitting: Vascular Surgery

## 2015-09-22 ENCOUNTER — Ambulatory Visit (HOSPITAL_COMMUNITY)
Admission: RE | Admit: 2015-09-22 | Discharge: 2015-09-22 | Disposition: A | Discharge status: Home / Self Care | Payer: Medicare Other | Source: Ambulatory Visit | Attending: Hematology & Oncology | Admitting: Hematology & Oncology

## 2015-09-22 DIAGNOSIS — N400 Enlarged prostate without lower urinary tract symptoms: Secondary | ICD-10-CM | POA: Insufficient documentation

## 2015-09-22 DIAGNOSIS — R911 Solitary pulmonary nodule: Secondary | ICD-10-CM | POA: Diagnosis not present

## 2015-09-22 DIAGNOSIS — N281 Cyst of kidney, acquired: Secondary | ICD-10-CM | POA: Insufficient documentation

## 2015-09-22 DIAGNOSIS — E278 Other specified disorders of adrenal gland: Secondary | ICD-10-CM | POA: Diagnosis not present

## 2015-09-22 DIAGNOSIS — C8589 Other specified types of non-Hodgkin lymphoma, extranodal and solid organ sites: Secondary | ICD-10-CM

## 2015-09-22 LAB — GLUCOSE, CAPILLARY: Glucose-Capillary: 155 mg/dL — ABNORMAL HIGH (ref 65–99)

## 2015-09-22 MED ORDER — FLUDEOXYGLUCOSE F - 18 (FDG) INJECTION: 11.9000 | Freq: Once | INTRAVENOUS | Status: DC | PRN

## 2015-09-27 ENCOUNTER — Ambulatory Visit (HOSPITAL_COMMUNITY)
Admission: RE | Admit: 2015-09-27 | Discharge: 2015-09-27 | Disposition: A | Discharge status: Home / Self Care | Payer: Medicare Other | Source: Ambulatory Visit | Attending: Vascular Surgery | Admitting: Vascular Surgery

## 2015-09-27 ENCOUNTER — Ambulatory Visit (INDEPENDENT_AMBULATORY_CARE_PROVIDER_SITE_OTHER): Payer: Medicare Other | Admitting: Vascular Surgery

## 2015-09-27 ENCOUNTER — Other Ambulatory Visit: Payer: Self-pay | Admitting: Hematology & Oncology

## 2015-09-27 ENCOUNTER — Encounter: Payer: Self-pay | Admitting: Vascular Surgery

## 2015-09-27 VITALS — BP 111/62 | HR 72 | Ht 70.0 in | Wt 243.0 lb

## 2015-09-27 DIAGNOSIS — R600 Localized edema: Secondary | ICD-10-CM

## 2015-09-27 DIAGNOSIS — M25561 Pain in right knee: Secondary | ICD-10-CM

## 2015-09-27 DIAGNOSIS — R609 Edema, unspecified: Secondary | ICD-10-CM | POA: Insufficient documentation

## 2015-09-27 NOTE — Progress Notes (Signed)
Referring Physician: Dr. Carolann Littler  Patient name: Gary Yates. MRN: [id number] DOB: 06/30/1933 Sex: male  REASON FOR CONSULT: Right LE lateral knee pain r/o PAD  HPI: Gary Yates. is a 82 y.o. male,  Who reports right lateral leg pain at rest with and unstable gait for the past 6 months.  The right leg pain starts at his lateral knee and moves up and down his leg.  If he rubs Biofreeze on his knee the pain completely goes away for 1 hour.  He states he had back surgery in January 2017 by Dr. Cyndy Freeze for  lateral recess and central stenosis from L3-S1.  At L2-3 he has a minimal amount of lateral recess stenosis.  At the his pre-op appointment before his lumbar decompression he had left leg pain more than right.   Other medical problems include peripheral neuropathy, DM, HTN, hyperlipidemia and CAD with A fib.  He is medically managed on Plavix, coumadin, Lipitor  And Metoprolol.    Past Medical History:  Diagnosis Date  . Anxiety   . Atrial fibrillation ([place])   . COPD (chronic obstructive pulmonary disease) ([place])    pt. denies  . Coronary artery disease    a. h/o Overlapping stents RCA;  b. 06/2011 Cath: patent stents, nonobs dzs, NL EF.  . Diabetic peripheral neuropathy ([place])   . DM (diabetes mellitus) ([place])    Type 2, peripheral neuropathy.  . Dyspnea    with exertion  . Dysrhythmia   . GERD (gastroesophageal reflux disease)   . History of bronchitis   . History of kidney stones   . Hyperlipidemia   . Hypertension   . Low testosterone   . Nephrolithiasis   . Osteoarthritis    shoulder  . Restless leg   . SVT (supraventricular tachycardia) ([place])   . Urinary frequency    Past Surgical History:  Procedure Laterality Date  . [place]  . CARDIAC CATHETERIZATION  01/2013  . CHOLECYSTECTOMY    . COLONOSCOPY    . CORONARY ANGIOPLASTY  2004  . EYE SURGERY Bilateral    cataracts  . LEFT HEART CATHETERIZATION WITH CORONARY ANGIOGRAM N/A 06/18/2011   Procedure: LEFT HEART CATHETERIZATION WITH CORONARY ANGIOGRAM;  Surgeon: Peter M Martinique, MD;  Location: [facility] CATH LAB;  Service: Cardiovascular;  Laterality: N/A;  . LEFT HEART CATHETERIZATION WITH CORONARY ANGIOGRAM N/A 01/27/2013   Procedure: LEFT HEART CATHETERIZATION WITH CORONARY ANGIOGRAM;  Surgeon: Burnell Blanks, MD;  Location: [facility] CATH LAB;  Service: Cardiovascular;  Laterality: N/A;  . LUMBAR LAMINECTOMY/DECOMPRESSION MICRODISCECTOMY N/A 02/07/2015   Procedure: Lumbar three-Sacral one Decompression;  Surgeon: Kevan Ny Ditty, MD;  Location: [facility] NEURO ORS;  Service: Neurosurgery;  Laterality: N/A;  L3 to S1 Decompression  . ORCHIECTOMY Right 09/01/2015   Procedure: RIGHT ORCHIECTOMY;  Surgeon: Kathie Rhodes, MD;  Location: [facility] ORS;  Service: Urology;  Laterality: Right;  . ROTATOR CUFF REPAIR Left     Family History  Problem Relation Age of Onset  . Alzheimer's disease Mother   . Heart disease Mother   . Heart disease Father   . Migraines Father   . Ulcers Father   . Coronary artery disease      Male 1st degree relative <50  . Coronary artery disease      male 1st degree relative <60  . Heart disease Sister   . Obesity Sister     Morbid  . Arthritis Sister   . Heart disease  Brother   . Arthritis Brother   . Sleep apnea Son   . Obesity Son   . Migraines Daughter   . Thyroid disease Daughter     SOCIAL HISTORY: Social History   Social History  . Marital status: Married    Spouse name: N/A  . Number of children: 3  . Years of education: N/A   Occupational History  . Retired Retired   Social History Main Topics  . Smoking status: Former Smoker    Packs/day: 1.50    Years: 20.00    Types: Cigarettes    Quit date: 04/05/1983  . Smokeless tobacco: Never Used  . Alcohol use No  . Drug use: No  . Sexual activity: Not Currently   Other Topics Concern  . Not on file   Social History Narrative  . No narrative on file    Allergies  Allergen Reactions    . Ace Inhibitors Other (See Comments)    cough  . Codeine Nausea Only and Rash       . Penicillins Rash    Childhood allergy Has patient had a PCN reaction causing immediate rash, facial/tongue/throat swelling, SOB or lightheadedness with hypotension: Yes Has patient had a PCN reaction causing severe rash involving mucus membranes or skin necrosis: Yes Has patient had a PCN reaction that required hospitalization No Has patient had a PCN reaction occurring within the last 10 years: No If all of the above answers are "NO", then may proceed with Cephalosporin use.     Current Outpatient Prescriptions  Medication Sig Dispense Refill  . ACCU-CHEK AVIVA PLUS test strip TEST 3 TIMES A DAY 100 each 3  . atorvastatin (LIPITOR) 40 MG tablet TAKE 1 TABLET BY MOUTH DAILY 90 tablet 1  . clonazePAM (KLONOPIN) 1 MG tablet TAKE 1 TABLET EVERY DAY AT BEDTIME AS NEEDED 30 tablet 5  . clopidogrel (PLAVIX) 75 MG tablet TAKE 1 TABLET DAILY WITH BREAKFAST 30 tablet 5  . fenofibrate micronized (LOFIBRA) 134 MG capsule Take 1 capsule by mouth  daily before breakfast 90 capsule 1  . gabapentin (NEURONTIN) 300 MG capsule Take 2 capsules by mouth 3  times a day (Patient taking differently: one capsule twice daily) 540 capsule 1  . glucose blood (ACCU-CHEK AVIVA PLUS) test strip TEST 3 TIMES A DAY 300 each 3  . HYDROcodone-acetaminophen (NORCO) 10-325 MG tablet Take 1-2 tablets by mouth every 4 (four) hours as needed for moderate pain. Maximum dose per 24 hours - 8 pills 30 tablet 0  . Insulin Glargine (TOUJEO SOLOSTAR) 300 UNIT/ML SOPN Inject 20 Units into the skin daily after breakfast.     . insulin lispro (HUMALOG) 100 UNIT/ML injection Take 1 times daily with meals as per sliding scale.    . metFORMIN (GLUCOPHAGE) 500 MG tablet TAKE 1 TABLET (500 MG TOTAL) BY MOUTH 2 (TWO) TIMES DAILY WITH A MEAL. 60 tablet 5  . metoprolol succinate (TOPROL-XL) 100 MG 24 hr tablet TAKE 1 TABLET EVERY DAY IMMEDIATELY FOLLOWING  A MEAL 90 tablet 1  . nitroGLYCERIN (NITROSTAT) 0.4 MG SL tablet Place 1 tablet (0.4 mg total) under the tongue every 5 (five) minutes as needed. Chest pain 25 tablet 6  . oxybutynin (DITROPAN XL) 15 MG 24 hr tablet     . pantoprazole (PROTONIX) 40 MG tablet TAKE 1 TABLET EVERY DAY 90 tablet 2  . pramipexole (MIRAPEX) 1.5 MG tablet MAY TAKE 1 TO 1 &1/2 TABLETS BY MOUTH AT BEDTIME 60 tablet 5  .  tamsulosin (FLOMAX) 0.4 MG CAPS capsule Take 0.4 mg by mouth daily as needed. For fluid    . Trospium Chloride 60 MG CP24     . vitamin B-12 (CYANOCOBALAMIN) 1000 MCG tablet Take 1 tablet (1,000 mcg total) by mouth daily. 30 tablet 0  . warfarin (COUMADIN) 5 MG tablet Take 1 tablet (5 mg total) by mouth daily. Except on Mon, Wed, Fri take 7.5 mg 40 tablet 1   No current facility-administered medications for this visit.    Facility-Administered Medications Ordered in Other Visits  Medication Dose Route Frequency Provider Last Rate Last Dose  . fludeoxyglucose F - 18 (FDG) injection 38.9 millicurie  37.3 millicurie Intravenous Once PRN Orlean Patten, MD      . testosterone cypionate (DEPOTESTOTERONE CYPIONATE) injection 200 mg  200 mg Intramuscular Q28 days [name], MD   200 mg at 12/26/11 0850  . testosterone cypionate (DEPOTESTOTERONE CYPIONATE) injection 200 mg  200 mg Intramuscular Q28 days [name], MD   200 mg at 01/23/12 0908  . testosterone cypionate (DEPOTESTOTERONE CYPIONATE) injection 200 mg  200 mg Intramuscular Q28 days [name], MD   200 mg at 02/25/12 0848  . testosterone cypionate (DEPOTESTOTERONE CYPIONATE) injection 200 mg  200 mg Intramuscular Q28 days [name], MD   200 mg at 06/03/12 1437    ROS:   General:  No weight loss, Fever, chills  HEENT: No recent headaches, no nasal bleeding, no visual changes, no sore throat  Neurologic: No dizziness, blackouts, seizures. No recent symptoms of stroke or mini- stroke. No recent episodes of slurred  speech, or temporary blindness.  Cardiac: No recent episodes of chest pain/pressure, no shortness of breath at rest.  No shortness of breath with exertion.  Denies history of atrial fibrillation or irregular heartbeat  Vascular: positive history of rest pain in right LE.  No history of claudication.  No history of non-healing ulcer, No history of DVT   Pulmonary: No home oxygen, no productive cough, no hemoptysis,  No asthma or wheezing  Musculoskeletal:  [X]  Arthritis, [X]  Low back pain,  [X]  Joint pain  Hematologic:No history of hypercoagulable state.  No history of easy bleeding.  No history of anemia  Gastrointestinal: No hematochezia or melena,  No gastroesophageal reflux, no trouble swallowing  Urinary: [X]  chronic Kidney disease, [ ]  on HD - [ ]  MWF or [ ]  TTHS, [ ]  Burning with urination, [ ]  Frequent urination, [ ]  Difficulty urinating;   Skin: No rashes  Psychological: No history of anxiety,  No history of depression   Physical Examination  Vitals:   09/27/15 1434  BP: 111/62  Pulse: 72  SpO2: 94%  Weight: 243 lb (110.2 kg)  Height: 5\' 10"  (1.778 m)    Body mass index is 34.87 kg/m.  General:  Alert and oriented, no acute distress HEENT: Normal Neck: No bruit or JVD Pulmonary: Clear to auscultation bilaterally Cardiac: Regular Rate and Rhythm without murmur Abdomen: Soft, non-tender, non-distended, no mass, no scars Skin: No rash Extremity Pulses:  2+ radial, brachial, femoral, dorsalis pedis, posterior tibial pulses bilaterally Musculoskeletal: No deformity or edema  Neurologic: Upper and lower extremity motor 5/5 and symmetric  DATA:  ABI's performed today 09/27/2015  Right 1.20 Left 1.22 Triphasic waveforms bilaterally  ASSESSMENT:   Normal arterial blood flow Right knee pain  Peripheral neuropathy    PLAN:   He has a history of peripheral neuropathy and lumbar spine decompression surgery.  His balance problems  with ambulation on uneven surfaces  is likely due to this history.  The ABI's and palpable pulses show he has normal blood flow.  His knee pain is dependent somewhat on position of supine with full knee extension.  He does have mild skin discoloration which could be caused by venous insufficiency.  There could be a small portion of venous entrapment at the right knee with full extension.  We demonstrated proper LE elevation with feet and ankles higher than the knees, knees slightly bent and elevated above the level of the heart in the supine position.  He will start with this.  We also think he should see an Orthopedic physician for the right knee.  He has seen Dr. Percell Miller in the past for his left knee.    He will f/u PRN.   Theda Sers, Smaran Gaus MAUREEN PA-C [facility]  He was seen today in conjunction with Dr. Scot Dock

## 2015-09-28 ENCOUNTER — Encounter: Payer: Self-pay | Admitting: Hematology & Oncology

## 2015-09-28 ENCOUNTER — Ambulatory Visit (HOSPITAL_BASED_OUTPATIENT_CLINIC_OR_DEPARTMENT_OTHER): Payer: Medicare Other | Admitting: Hematology & Oncology

## 2015-09-28 ENCOUNTER — Other Ambulatory Visit (HOSPITAL_BASED_OUTPATIENT_CLINIC_OR_DEPARTMENT_OTHER): Payer: Medicare Other

## 2015-09-28 ENCOUNTER — Other Ambulatory Visit: Payer: Self-pay | Admitting: Hematology & Oncology

## 2015-09-28 ENCOUNTER — Ambulatory Visit: Payer: Medicare Other

## 2015-09-28 VITALS — BP 141/68 | HR 73 | Temp 98.4°F | Resp 16 | Ht 70.0 in | Wt 254.4 lb

## 2015-09-28 DIAGNOSIS — I482 Chronic atrial fibrillation: Secondary | ICD-10-CM | POA: Diagnosis not present

## 2015-09-28 DIAGNOSIS — E119 Type 2 diabetes mellitus without complications: Secondary | ICD-10-CM

## 2015-09-28 DIAGNOSIS — C8339 Diffuse large B-cell lymphoma, extranodal and solid organ sites: Secondary | ICD-10-CM

## 2015-09-28 DIAGNOSIS — B3789 Other sites of candidiasis: Secondary | ICD-10-CM | POA: Diagnosis not present

## 2015-09-28 DIAGNOSIS — C8589 Other specified types of non-Hodgkin lymphoma, extranodal and solid organ sites: Secondary | ICD-10-CM | POA: Insufficient documentation

## 2015-09-28 DIAGNOSIS — C8580 Other specified types of non-Hodgkin lymphoma, unspecified site: Secondary | ICD-10-CM

## 2015-09-28 HISTORY — DX: Other specified types of non-hodgkin lymphoma, extranodal and solid organ sites: C85.89

## 2015-09-28 LAB — COMPREHENSIVE METABOLIC PANEL (CC13)
A/G RATIO: 1.3 (ref 1.2–2.2)
ALBUMIN: 3.9 g/dL (ref 3.5–4.7)
ALK PHOS: 83 IU/L (ref 39–117)
ALT: 8 IU/L (ref 0–44)
AST (SGOT): 19 IU/L (ref 0–40)
BILIRUBIN TOTAL: 0.2 mg/dL (ref 0.0–1.2)
BUN / CREAT RATIO: 16 (ref 10–24)
BUN: 17 mg/dL (ref 8–27)
CHLORIDE: 104 mmol/L (ref 96–106)
Calcium, Ser: 9.4 mg/dL (ref 8.6–10.2)
Carbon Dioxide, Total: 25 mmol/L (ref 18–29)
Creatinine, Ser: 1.07 mg/dL (ref 0.76–1.27)
GFR calc non Af Amer: 65 mL/min/{1.73_m2} (ref >59)
GFR, EST AFRICAN AMERICAN: 75 mL/min/{1.73_m2} (ref >59)
GLOBULIN, TOTAL: 3.1 g/dL (ref 1.5–4.5)
Glucose: 182 mg/dL — ABNORMAL HIGH (ref 65–99)
Potassium, Ser: 4.1 mmol/L (ref 3.5–5.2)
SODIUM: 135 mmol/L (ref 134–144)
Total Protein: 7 g/dL (ref 6.0–8.5)

## 2015-09-28 LAB — CBC WITH DIFFERENTIAL (CANCER CENTER ONLY)
BASO#: 0.1 10*3/uL (ref 0.0–0.2)
BASO%: 0.9 % (ref 0.0–2.0)
EOS%: 3.3 % (ref 0.0–7.0)
Eosinophils Absolute: 0.2 10*3/uL (ref 0.0–0.5)
HCT: 34.8 % — ABNORMAL LOW (ref 38.7–49.9)
HEMOGLOBIN: 11.6 g/dL — AB (ref 13.0–17.1)
LYMPH#: 1.5 10*3/uL (ref 0.9–3.3)
LYMPH%: 22.8 % (ref 14.0–48.0)
MCH: 30.3 pg (ref 28.0–33.4)
MCHC: 33.3 g/dL (ref 32.0–35.9)
MCV: 91 fL (ref 82–98)
MONO#: 0.6 10*3/uL (ref 0.1–0.9)
MONO%: 8.5 % (ref 0.0–13.0)
NEUT%: 64.5 % (ref 40.0–80.0)
NEUTROS ABS: 4.2 10*3/uL (ref 1.5–6.5)
Platelets: 205 10*3/uL (ref 145–400)
RBC: 3.83 10*6/uL — AB (ref 4.20–5.70)
RDW: 14.3 % (ref 11.1–15.7)
WBC: 6.5 10*3/uL (ref 4.0–10.0)

## 2015-09-28 NOTE — Progress Notes (Signed)
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  Initiate first dose at a rate of 50 mg/hr.  In the absence of infusion toxicity, increase infusion rate by 50 mg/hr increments every 30 minutes, to a maximum of 400 mg/hr.  For  follicular and DLBC lymphoma patients see "Rapid infusion protocol" link for more information about accelerating the infusion time of rituximab. Dose Mod: None     Cyclophosphamide (Cytoxan(R)) 750 mg/m2 in 250 mL NS IV over 30 minutes day 1 only Dose Mod: 400mg /m2     Doxorubicin (Adriamycin(R)) 50 mg/m2 IV push day 1 only Dose Mod: 35mg /m2     Vincristine (Oncovin(R)) 1.4 mg/m2; MAX 2 mg in 50 mL normal saline IV over 15 minutes (MAX DOSE 2 mg) Dose Mod: 1 mg     Prednisone 100 mg orally daily days 1,2,3,4,5; RX x 5 days start D1 Dose Mod: 60 Additional Orders: Hepatitis B&C testing recommended prior to rituximab use on all patients. Final concentration of rituximab must be between 1 and 4 mg/mL.  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

## 2015-09-28 NOTE — Progress Notes (Signed)
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 Other    New Patient  : I had lymphoma in my testicle.  HPI: Mr. Gaglio is a very nice 82 year old white male. He actually is the father of one my wife's best friends.  He has chronic atrial fibrillation. He is on Coumadin. He has diabetes. He is on insulin.  He noted that his right testicle was getting larger a month or so ago. It was a little painful. He had no bleeding. He had no weight loss. He had no rashes. He had no pruritus. There was no issues with bowels or bladder.  He saw Dr. Karsten Ro of urology. Dr. Karsten Ro went ahead and took him to surgery. 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 The pathology report ([id number]) showed a diffuse large cell non-Hodgkin's lymphoma. This was a B-cell neoplasm. It was negative for CD10.  We did go ahead and get a PET scan on him. The PET scan showed some focal activity in the right scrotum. This could be postsurgical. However, residual tumor is suspected. There was a hypermetabolic left adrenal nodule. There is no obvious adenopathy.  He had an echocardiogram done on September 13. This showed an ejection fraction of 55-60%.  He recovered from surgery quite nicely. He had his Coumadin stopped 5 days before the procedure. He was not placed on any low molecular weight heparin. He's had no problems with the Coumadin being stopped.  He has not noted any cough. He's had no abdominal pain. He's had no headache. He's had no visual issues.  He used to smoke. He stopped 40 years ago. He has no alcohol use. Has no obvious occupational exposures. He used to work for a [employer], retail.  There is no obvious family history.  He's had no history of trauma to the scrotal area.  Overall, I said that his performance status is ECOG 1-2.  He does get around with a rolling walker. This is because  of some weakness in his right leg from prior back surgery.     Past Medical History:  Diagnosis Date  . Anxiety   . Atrial fibrillation ([place])   . COPD (chronic obstructive pulmonary disease) ([place])    pt. denies  . Coronary artery disease    a. h/o Overlapping stents RCA;  b. 06/2011 Cath: patent stents, nonobs dzs, NL EF.  . Diabetic peripheral neuropathy ([place])   . Diffuse non-Hodgkin's lymphoma of testis ([place]) 09/28/2015  . DM (diabetes mellitus) ([place])    Type 2, peripheral neuropathy.  . Dyspnea    with exertion  . Dysrhythmia   . GERD (gastroesophageal reflux disease)   . History of bronchitis   . History of kidney stones   . Hyperlipidemia   . Hypertension   . Low testosterone   . Nephrolithiasis   . Osteoarthritis    shoulder  . Restless leg   . SVT (supraventricular tachycardia) ([place])   . Urinary frequency   :  Past Surgical History:  Procedure Laterality Date  . [place]  . CARDIAC CATHETERIZATION  01/2013  . CHOLECYSTECTOMY    . COLONOSCOPY    . CORONARY ANGIOPLASTY  2004  . EYE SURGERY Bilateral    cataracts  . LEFT HEART CATHETERIZATION WITH CORONARY ANGIOGRAM N/A 06/18/2011   Procedure: LEFT HEART CATHETERIZATION WITH CORONARY ANGIOGRAM;  Surgeon:  Keriann Rankin M [place], MD;  Location[facility] CATH LAB;  Service: Cardiovascular;  Laterality: N/A;  . LEFT HEART CATHETERIZATION WITH CORONARY ANGIOGRAM N/A 01/27/2013   Procedure: LEFT HEART CATHETERIZATION WITH CORONARY ANGIOGRAM;  Surgeon: Burnell Blanks, MD;  Location: [facility] CATH LAB;  Service: Cardiovascular;  Laterality: N/A;  . LUMBAR LAMINECTOMY/DECOMPRESSION MICRODISCECTOMY N/A 02/07/2015   Procedure: Lumbar three-Sacral one Decompression;  Surgeon: Kevan Ny Ditty, MD;  Location: [facility] OR[facility];  Service: Neurosurgery;  Laterality: N/A;  L3 to S1 Decompression  . ORCHIECTOMY Right 09/01/2015   Procedure: RIGHT ORCHIECTOMY;  Surgeon: Kathie Rhodes, MD;  Location: [facility] ORS;  Service: Urology;   Laterality: Right;  . ROTATOR CUFF REPAIR Left   :   Current Outpatient Prescriptions:  .  ACCU-CHEK AVIVA PLUS test strip, TEST 3 TIMES A DAY, Disp: 100 each, Rfl: 3 .  atorvastatin (LIPITOR) 40 MG tablet, TAKE 1 TABLET BY MOUTH DAILY, Disp: 90 tablet, Rfl: 1 .  clonazePAM (KLONOPIN) 1 MG tablet, TAKE 1 TABLET EVERY DAY AT BEDTIME AS NEEDED, Disp: 30 tablet, Rfl: 5 .  clopidogrel (PLAVIX) 75 MG tablet, TAKE 1 TABLET DAILY WITH BREAKFAST, Disp: 30 tablet, Rfl: 5 .  fenofibrate micronized (LOFIBRA) 134 MG capsule, Take 1 capsule by mouth  daily before breakfast, Disp: 90 capsule, Rfl: 1 .  gabapentin (NEURONTIN) 300 MG capsule, Take 2 capsules by mouth 3  times a day (Patient taking differently: one capsule twice daily), Disp: 540 capsule, Rfl: 1 .  glucose blood (ACCU-CHEK AVIVA PLUS) test strip, TEST 3 TIMES A DAY, Disp: 300 each, Rfl: 3 .  HYDROcodone-acetaminophen (NORCO) 10-325 MG tablet, Take 1-2 tablets by mouth every 4 (four) hours as needed for moderate pain. Maximum dose per 24 hours - 8 pills, Disp: 30 tablet, Rfl: 0 .  Insulin Glargine (TOUJEO SOLOSTAR) 300 UNIT/ML SOPN, Inject 20 Units into the skin daily after breakfast. , Disp: , Rfl:  .  insulin lispro (HUMALOG) 100 UNIT/ML injection, Take 1 times daily with meals as per sliding scale., Disp: , Rfl:  .  metFORMIN (GLUCOPHAGE) 500 MG tablet, TAKE 1 TABLET (500 MG TOTAL) BY MOUTH 2 (TWO) TIMES DAILY WITH A MEAL., Disp: 60 tablet, Rfl: 5 .  metoprolol succinate (TOPROL-XL) 100 MG 24 hr tablet, TAKE 1 TABLET EVERY DAY IMMEDIATELY FOLLOWING A MEAL, Disp: 90 tablet, Rfl: 1 .  nitroGLYCERIN (NITROSTAT) 0.4 MG SL tablet, Place 1 tablet (0.4 mg total) under the tongue every 5 (five) minutes as needed. Chest pain, Disp: 25 tablet, Rfl: 6 .  oxybutynin (DITROPAN XL) 15 MG 24 hr tablet, , Disp: , Rfl:  .  pantoprazole (PROTONIX) 40 MG tablet, TAKE 1 TABLET EVERY DAY, Disp: 90 tablet, Rfl: 2 .  pramipexole (MIRAPEX) 1.5 MG tablet, MAY TAKE 1  TO 1 &1/2 TABLETS BY MOUTH AT BEDTIME, Disp: 60 tablet, Rfl: 5 .  tamsulosin (FLOMAX) 0.4 MG CAPS capsule, Take 0.4 mg by mouth daily as needed. For fluid, Disp: , Rfl:  .  Trospium Chloride 60 MG CP24, , Disp: , Rfl:  .  vitamin B-12 (CYANOCOBALAMIN) 1000 MCG tablet, Take 1 tablet (1,000 mcg total) by mouth daily., Disp: 30 tablet, Rfl: 0 .  warfarin (COUMADIN) 5 MG tablet, Take 1 tablet (5 mg total) by mouth daily. Except on Mon, Wed, Fri take 7.5 mg, Disp: 40 tablet, Rfl: 1 No current facility-administered medications for this visit.   Facility-Administered Medications Ordered in Other Visits:  .  testosterone cypionate (DEPOTESTOTERONE CYPIONATE) injection 200 mg, 200 mg,  Intramuscular, Q28 days, [name], MD, 200 mg at 12/26/11 0850 .  testosterone cypionate (DEPOTESTOTERONE CYPIONATE) injection 200 mg, 200 mg, Intramuscular, Q28 days, [name], MD, 200 mg at 01/23/12 0908 .  testosterone cypionate (DEPOTESTOTERONE CYPIONATE) injection 200 mg, 200 mg, Intramuscular, Q28 days, [name], MD, 200 mg at 02/25/12 0848 .  testosterone cypionate (DEPOTESTOTERONE CYPIONATE) injection 200 mg, 200 mg, Intramuscular, Q28 days, [name], MD, 200 mg at 06/03/12 1437:  :  Allergies  Allergen Reactions  . Ace Inhibitors Other (See Comments)    cough  . Codeine Nausea Only and Rash       . Penicillins Rash    Childhood allergy Has patient had a PCN reaction causing immediate rash, facial/tongue/throat swelling, SOB or lightheadedness with hypotension: Yes Has patient had a PCN reaction causing severe rash involving mucus membranes or skin necrosis: Yes Has patient had a PCN reaction that required hospitalization No Has patient had a PCN reaction occurring within the last 10 years: No If all of the above answers are "NO", then may proceed with Cephalosporin use.   :  Family History  Problem Relation Age of Onset  . Alzheimer's disease Mother   . Heart  disease Mother   . Heart disease Father   . Migraines Father   . Ulcers Father   . Coronary artery disease      Male 1st degree relative <50  . Coronary artery disease      male 1st degree relative <60  . Heart disease Sister   . Obesity Sister     Morbid  . Arthritis Sister   . Heart disease Brother   . Arthritis Brother   . Sleep apnea Son   . Obesity Son   . Migraines Daughter   . Thyroid disease Daughter   :  Social History   Social History  . Marital status: Married    Spouse name: N/A  . Number of children: 3  . Years of education: N/A   Occupational History  . Retired Retired   Social History Main Topics  . Smoking status: Former Smoker    Packs/day: 1.50    Years: 20.00    Types: Cigarettes    Quit date: 04/05/1983  . Smokeless tobacco: Never Used  . Alcohol use No  . Drug use: No  . Sexual activity: Not Currently   Other Topics Concern  . Not on file   Social History Narrative  . No narrative on file  :  Pertinent items are noted in HPI.  Exam: @IPVITALS @  Well-developed and well-nourished white male in no obvious distress. Vital signs show territory of 98.4. Pulse 73. Blood pressure 141/68. Weight is 245 pounds. Head and neck exam shows no ocular or oral lesions. He has no palpable cervical or supraclavicular lymph nodes. Lungs are clear bilaterally. Cardiac exam is irregular irregular rate and rhythm. This is consistent with atrial fibrillation. Has no murmurs. Abdomen is soft. He has good bowel sounds. There is no fluid wave. There is no palpable liver or spleen tip. Back exam shows no tenderness over the spine, ribs or hips. Extremities shows no clubbing, cyanosis or edema. There may be some chronic mild edema in his lower legs. Skin exam shows no rashes, ecchymoses or petechia. Neurological exam shows no focal neurological deficits.     Recent Labs  09/28/15 1459  WBC 6.5  HGB 11.6*  HCT 34.8*  PLT 205   No results for input(s): NA,  K,  CL, CO2, GLUCOSE, BUN, CREATININE, CALCIUM in the last 72 hours.  Blood smear review:  None  Pathology: See above     Assessment and Plan:  Mr. Ruane is an 82 year old white male. He has testicular lymphoma. This is a diffuse large cell lymphoma. It is not surprising that this was found in a guy his age. I think testicular lymphoma was of the most, malignancy in men over 63 years old.  He certainly does not have bulky disease by any means. Systemically, it looks like he has only one area of abnormality.  He clearly will need systemic chemotherapy. Testicular lymphoma tends to be more aggressive then regular large cell lymphoma. It does have a higher risk of recurrence.  Despite his age, and comorbid medical conditions, he doesn't be in pretty good shape. I think that he could tolerate R-CHOP. We will have to use a mildly reduced dose for him I think.  His IPI score is not that high. I do not have back the LDH but I would not think that this would be elevated.  I do know that there is a risk of relapse into the CNS. We may have to think about some type of CNS prophylaxis in the future. However, being on Coumadin and make this much more difficult.  He definitely will need scrotal radiation therapy. By the PET scan, it looks like there still might be some activity in the right scrotal sac. As such, I think scrotal radiation would definitely be reasonable.  I spent about an hour and half with he and his family. They are very nice. I explained the situation. I explained why I thought chemotherapy would be helpful. Again I think he could tolerate chemotherapy but we would have to do some dose reduction.  I gave them information sheets about R- CHOP. He will have the chemotherapy class.  He will need to have a Port-A-Cath placed. I will set this up for next week.  I went over the side effects of treatment. I biggest concern would be the risk of neutropenia. He will need Neulasta.  I gave him a  prayer blanket. He was very thankful for this.  Again, we will see about getting the treatment started next week.  I will plan for a follow-up PET scan after the second cycle of treatment.  I think that probably 6 cycles is all we will need.  I will plan to get him back to see me for his second cycle of treatment. We will have him come back for some lab work halfway between the first and second treatments.

## 2015-09-29 ENCOUNTER — Telehealth: Payer: Self-pay | Admitting: Cardiovascular Disease

## 2015-09-29 ENCOUNTER — Other Ambulatory Visit: Payer: Self-pay | Admitting: Pharmacist

## 2015-09-29 LAB — URIC ACID: URIC ACID, SERUM: 3.7 mg/dL (ref 2.6–7.4)

## 2015-09-29 LAB — BETA 2 MICROGLOBULIN, SERUM: BETA 2: 2.8 mg/L — AB (ref 0.6–2.4)

## 2015-09-29 LAB — LACTATE DEHYDROGENASE: LDH: 192 U/L (ref 125–245)

## 2015-09-29 NOTE — Telephone Encounter (Signed)
ew Message:    Pt would like for somebody to call him asap.He needs a Air cabin crew put in and he needs to know about stopping his medicine. I did not send this message to Pat,because she was seeing pts. Pt was insisting on speaking to somebody right away.

## 2015-09-29 NOTE — Telephone Encounter (Signed)
Spoke with pt and he states that his Oncologist had already given instructions for him to have his Plavix and Warfarin in preparation for his Port A Cath placement.  Pt wanted to know if Clopidogrel was the same thing as Plavix.  Advised pt Clopidogrel was the generic name for Plavix.  Pt also stated that Oncology told him he needed to get his flu shot done before having his Port A Cath placed.  Pt asked if ok to have it done at [facility].  Advised pt that would be fine.  Pt very appreciative for all assistance.

## 2015-10-03 ENCOUNTER — Encounter: Payer: Self-pay | Admitting: *Deleted

## 2015-10-03 ENCOUNTER — Other Ambulatory Visit: Payer: Self-pay | Admitting: *Deleted

## 2015-10-03 ENCOUNTER — Other Ambulatory Visit: Payer: Medicare Other

## 2015-10-03 ENCOUNTER — Other Ambulatory Visit: Payer: Self-pay | Admitting: Radiology

## 2015-10-03 DIAGNOSIS — C8589 Other specified types of non-Hodgkin lymphoma, extranodal and solid organ sites: Secondary | ICD-10-CM

## 2015-10-03 MED ORDER — PROCHLORPERAZINE MALEATE 10 MG PO TABS: 10.0000 mg | ORAL_TABLET | Freq: Four times a day (QID) | ORAL | 6 refills | Status: DC | PRN

## 2015-10-03 MED ORDER — LIDOCAINE-PRILOCAINE 2.5-2.5 % EX CREA: TOPICAL_CREAM | CUTANEOUS | 3 refills | Status: DC

## 2015-10-03 MED ORDER — LORAZEPAM 0.5 MG PO TABS: 0.5000 mg | ORAL_TABLET | Freq: Four times a day (QID) | ORAL | 0 refills | Status: DC | PRN

## 2015-10-03 MED ORDER — ONDANSETRON HCL 8 MG PO TABS: 8.0000 mg | ORAL_TABLET | Freq: Two times a day (BID) | ORAL | 1 refills | Status: DC | PRN

## 2015-10-03 MED ORDER — PREDNISONE 20 MG PO TABS: 60.0000 mg | ORAL_TABLET | Freq: Every day | ORAL | 6 refills | Status: DC

## 2015-10-04 ENCOUNTER — Other Ambulatory Visit: Payer: Medicare Other

## 2015-10-04 ENCOUNTER — Ambulatory Visit: Payer: Medicare Other

## 2015-10-04 ENCOUNTER — Ambulatory Visit: Payer: Medicare Other | Admitting: Hematology & Oncology

## 2015-10-04 ENCOUNTER — Ambulatory Visit: Payer: Medicare Other | Admitting: Family Medicine

## 2015-10-05 ENCOUNTER — Ambulatory Visit (HOSPITAL_COMMUNITY)
Admission: RE | Admit: 2015-10-05 | Discharge: 2015-10-05 | Disposition: A | Discharge status: Home / Self Care | Payer: Medicare Other | Source: Ambulatory Visit | Attending: Hematology & Oncology | Admitting: Hematology & Oncology

## 2015-10-05 ENCOUNTER — Ambulatory Visit: Payer: Medicare Other

## 2015-10-05 ENCOUNTER — Encounter (HOSPITAL_COMMUNITY): Payer: Self-pay

## 2015-10-05 ENCOUNTER — Other Ambulatory Visit: Payer: Self-pay | Admitting: Hematology & Oncology

## 2015-10-05 DIAGNOSIS — I471 Supraventricular tachycardia: Secondary | ICD-10-CM | POA: Insufficient documentation

## 2015-10-05 DIAGNOSIS — G2581 Restless legs syndrome: Secondary | ICD-10-CM | POA: Insufficient documentation

## 2015-10-05 DIAGNOSIS — Z794 Long term (current) use of insulin: Secondary | ICD-10-CM | POA: Insufficient documentation

## 2015-10-05 DIAGNOSIS — I2510 Atherosclerotic heart disease of native coronary artery without angina pectoris: Secondary | ICD-10-CM | POA: Insufficient documentation

## 2015-10-05 DIAGNOSIS — Z880 Allergy status to penicillin: Secondary | ICD-10-CM | POA: Insufficient documentation

## 2015-10-05 DIAGNOSIS — I4891 Unspecified atrial fibrillation: Secondary | ICD-10-CM | POA: Insufficient documentation

## 2015-10-05 DIAGNOSIS — Z7902 Long term (current) use of antithrombotics/antiplatelets: Secondary | ICD-10-CM | POA: Insufficient documentation

## 2015-10-05 DIAGNOSIS — F419 Anxiety disorder, unspecified: Secondary | ICD-10-CM | POA: Insufficient documentation

## 2015-10-05 DIAGNOSIS — C8595 Non-Hodgkin lymphoma, unspecified, lymph nodes of inguinal region and lower limb: Secondary | ICD-10-CM | POA: Insufficient documentation

## 2015-10-05 DIAGNOSIS — E1142 Type 2 diabetes mellitus with diabetic polyneuropathy: Secondary | ICD-10-CM | POA: Insufficient documentation

## 2015-10-05 DIAGNOSIS — Z7984 Long term (current) use of oral hypoglycemic drugs: Secondary | ICD-10-CM | POA: Diagnosis not present

## 2015-10-05 DIAGNOSIS — E785 Hyperlipidemia, unspecified: Secondary | ICD-10-CM | POA: Insufficient documentation

## 2015-10-05 DIAGNOSIS — Z8249 Family history of ischemic heart disease and other diseases of the circulatory system: Secondary | ICD-10-CM | POA: Insufficient documentation

## 2015-10-05 DIAGNOSIS — Z87891 Personal history of nicotine dependence: Secondary | ICD-10-CM | POA: Diagnosis not present

## 2015-10-05 DIAGNOSIS — J449 Chronic obstructive pulmonary disease, unspecified: Secondary | ICD-10-CM | POA: Insufficient documentation

## 2015-10-05 DIAGNOSIS — I10 Essential (primary) hypertension: Secondary | ICD-10-CM | POA: Insufficient documentation

## 2015-10-05 DIAGNOSIS — B3789 Other sites of candidiasis: Secondary | ICD-10-CM

## 2015-10-05 DIAGNOSIS — M1990 Unspecified osteoarthritis, unspecified site: Secondary | ICD-10-CM | POA: Insufficient documentation

## 2015-10-05 DIAGNOSIS — Z7952 Long term (current) use of systemic steroids: Secondary | ICD-10-CM | POA: Diagnosis not present

## 2015-10-05 DIAGNOSIS — Z9221 Personal history of antineoplastic chemotherapy: Secondary | ICD-10-CM | POA: Insufficient documentation

## 2015-10-05 DIAGNOSIS — Z87442 Personal history of urinary calculi: Secondary | ICD-10-CM | POA: Diagnosis not present

## 2015-10-05 DIAGNOSIS — C8589 Other specified types of non-Hodgkin lymphoma, extranodal and solid organ sites: Secondary | ICD-10-CM

## 2015-10-05 DIAGNOSIS — Z7901 Long term (current) use of anticoagulants: Secondary | ICD-10-CM | POA: Diagnosis not present

## 2015-10-05 DIAGNOSIS — K219 Gastro-esophageal reflux disease without esophagitis: Secondary | ICD-10-CM | POA: Insufficient documentation

## 2015-10-05 HISTORY — PX: IR GENERIC HISTORICAL: IMG1180011

## 2015-10-05 LAB — CBC WITH DIFFERENTIAL/PLATELET
Basophils Absolute: 0.1 10*3/uL (ref 0.0–0.1)
Basophils Relative: 1 %
EOS ABS: 0.2 10*3/uL (ref 0.0–0.7)
EOS PCT: 4 %
HCT: 34.5 % — ABNORMAL LOW (ref 39.0–52.0)
HEMOGLOBIN: 11.3 g/dL — AB (ref 13.0–17.0)
LYMPHS ABS: 1.5 10*3/uL (ref 0.7–4.0)
Lymphocytes Relative: 27 %
MCH: 29.7 pg (ref 26.0–34.0)
MCHC: 32.8 g/dL (ref 30.0–36.0)
MCV: 90.6 fL (ref 78.0–100.0)
MONO ABS: 0.5 10*3/uL (ref 0.1–1.0)
MONOS PCT: 9 %
NEUTROS PCT: 59 %
Neutro Abs: 3.2 10*3/uL (ref 1.7–7.7)
Platelets: 235 10*3/uL (ref 150–400)
RBC: 3.81 MIL/uL — ABNORMAL LOW (ref 4.22–5.81)
RDW: 14.3 % (ref 11.5–15.5)
WBC: 5.4 10*3/uL (ref 4.0–10.5)

## 2015-10-05 LAB — BASIC METABOLIC PANEL
Anion gap: 7 (ref 5–15)
BUN: 21 mg/dL — AB (ref 6–20)
CHLORIDE: 102 mmol/L (ref 101–111)
CO2: 25 mmol/L (ref 22–32)
CREATININE: 1.33 mg/dL — AB (ref 0.61–1.24)
Calcium: 9.3 mg/dL (ref 8.9–10.3)
GFR calc Af Amer: 57 mL/min — ABNORMAL LOW (ref >60)
GFR calc non Af Amer: 49 mL/min — ABNORMAL LOW (ref >60)
GLUCOSE: 151 mg/dL — AB (ref 65–99)
Potassium: 3.9 mmol/L (ref 3.5–5.1)
Sodium: 134 mmol/L — ABNORMAL LOW (ref 135–145)

## 2015-10-05 LAB — PROTIME-INR
INR: 0.98
PROTHROMBIN TIME: 12.9 s (ref 11.4–15.2)

## 2015-10-05 LAB — GLUCOSE, CAPILLARY: Glucose-Capillary: 146 mg/dL — ABNORMAL HIGH (ref 65–99)

## 2015-10-05 LAB — APTT: aPTT: 32 seconds (ref 24–36)

## 2015-10-05 MED ORDER — VANCOMYCIN HCL 10 G IV SOLR
1500.0000 mg | INTRAVENOUS | Status: AC
  Administered 2015-10-05: 1500 mg via INTRAVENOUS
  Filled 2015-10-05: qty 1500

## 2015-10-05 MED ORDER — FENTANYL CITRATE (PF) 100 MCG/2ML IJ SOLN
INTRAMUSCULAR | Status: AC | PRN
  Administered 2015-10-05 (×2): 50 ug via INTRAVENOUS

## 2015-10-05 MED ORDER — SODIUM CHLORIDE 0.9 % IV SOLN
INTRAVENOUS | Status: DC
  Administered 2015-10-05: 08:00:00 via INTRAVENOUS

## 2015-10-05 MED ORDER — MIDAZOLAM HCL 2 MG/2ML IJ SOLN
INTRAMUSCULAR | Status: AC | PRN
  Administered 2015-10-05 (×3): 1 mg via INTRAVENOUS

## 2015-10-05 MED ORDER — FENTANYL CITRATE (PF) 100 MCG/2ML IJ SOLN
INTRAMUSCULAR | Status: AC
  Filled 2015-10-05: qty 2

## 2015-10-05 MED ORDER — LIDOCAINE HCL 1 % IJ SOLN
INTRAMUSCULAR | Status: AC
  Filled 2015-10-05: qty 20

## 2015-10-05 MED ORDER — MIDAZOLAM HCL 2 MG/2ML IJ SOLN
INTRAMUSCULAR | Status: AC
  Filled 2015-10-05: qty 4

## 2015-10-05 MED ORDER — NALOXONE HCL 0.4 MG/ML IJ SOLN
INTRAMUSCULAR | Status: AC
  Filled 2015-10-05: qty 1

## 2015-10-05 MED ORDER — HEPARIN SOD (PORK) LOCK FLUSH 100 UNIT/ML IV SOLN
INTRAVENOUS | Status: AC
  Filled 2015-10-05: qty 5

## 2015-10-05 MED ORDER — FLUMAZENIL 0.5 MG/5ML IV SOLN
INTRAVENOUS | Status: AC
  Filled 2015-10-05: qty 5

## 2015-10-05 MED ORDER — LIDOCAINE HCL 1 % IJ SOLN
INTRAMUSCULAR | Status: AC | PRN
  Administered 2015-10-05: 15 mL

## 2015-10-05 MED ORDER — HEPARIN SODIUM (PORCINE) 1000 UNIT/ML IJ SOLN
INTRAMUSCULAR | Status: AC | PRN
  Administered 2015-10-05: 5000 [IU]

## 2015-10-05 NOTE — Discharge Instructions (Signed)
Implanted Port Home Guide °An implanted port is a type of central line that is placed under the skin. Central lines are used to provide IV access when treatment or nutrition needs to be given through a person's veins. Implanted ports are used for long-term IV access. An implanted port may be placed because:  °· You need IV medicine that would be irritating to the small veins in your hands or arms.   °· You need long-term IV medicines, such as antibiotics.   °· You need IV nutrition for a long period.   °· You need frequent blood draws for lab tests.   °· You need dialysis.   °Implanted ports are usually placed in the chest area, but they can also be placed in the upper arm, the abdomen, or the leg. An implanted port has two main parts:  °· Reservoir. The reservoir is round and will appear as a small, raised area under your skin. The reservoir is the part where a needle is inserted to give medicines or draw blood.   °· Catheter. The catheter is a thin, flexible tube that extends from the reservoir. The catheter is placed into a large vein. Medicine that is inserted into the reservoir goes into the catheter and then into the vein.   °HOW WILL I CARE FOR MY INCISION SITE? °Do not get the incision site wet. Bathe or shower as directed by your health care provider.  °HOW IS MY PORT ACCESSED? °Special steps must be taken to access the port:  °· Before the port is accessed, a numbing cream can be placed on the skin. This helps numb the skin over the port site.   °· Your health care provider uses a sterile technique to access the port. °· Your health care provider must put on a mask and sterile gloves. °· The skin over your port is cleaned carefully with an antiseptic and allowed to dry. °· The port is gently pinched between sterile gloves, and a needle is inserted into the port. °· Only "non-coring" port needles should be used to access the port. Once the port is accessed, a blood return should be checked. This helps  ensure that the port is in the vein and is not clogged.   °· If your port needs to remain accessed for a constant infusion, a clear (transparent) bandage will be placed over the needle site. The bandage and needle will need to be changed every week, or as directed by your health care provider.   °· Keep the bandage covering the needle clean and dry. Do not get it wet. Follow your health care provider's instructions on how to take a shower or bath while the port is accessed.   °· If your port does not need to stay accessed, no bandage is needed over the port.   °WHAT IS FLUSHING? °Flushing helps keep the port from getting clogged. Follow your health care provider's instructions on how and when to flush the port. Ports are usually flushed with saline solution or a medicine called heparin. The need for flushing will depend on how the port is used.  °· If the port is used for intermittent medicines or blood draws, the port will need to be flushed:   °· After medicines have been given.   °· After blood has been drawn.   °· As part of routine maintenance.   °· If a constant infusion is running, the port may not need to be flushed.   °HOW LONG WILL MY PORT STAY IMPLANTED? °The port can stay in for as long as your health care   provider thinks it is needed. When it is time for the port to come out, surgery will be done to remove it. The procedure is similar to the one performed when the port was put in.  °WHEN SHOULD I SEEK IMMEDIATE MEDICAL CARE? °When you have an implanted port, you should seek immediate medical care if:  °· You notice a bad smell coming from the incision site.   °· You have swelling, redness, or drainage at the incision site.   °· You have more swelling or pain at the port site or the surrounding area.   °· You have a fever that is not controlled with medicine. °  °This information is not intended to replace advice given to you by your health care provider. Make sure you discuss any questions you have with  your health care provider. °  °Document Released: 12/24/2004 Document Revised: 10/14/2012 Document Reviewed: 08/31/2012 °Elsevier Interactive Patient Education ©2016 [employer] °Implanted Port Insertion, Care After °Refer to this sheet in the next few weeks. These instructions provide you with information on caring for yourself after your procedure. Your health care provider may also give you more specific instructions. Your treatment has been planned according to current medical practices, but problems sometimes occur. Call your health care provider if you have any problems or questions after your procedure. °WHAT TO EXPECT AFTER THE PROCEDURE °After your procedure, it is typical to have the following:  °· Discomfort at the port insertion site. Ice packs to the area will help. °· Bruising on the skin over the port. This will subside in 3-4 days. °HOME CARE INSTRUCTIONS °· After your port is placed, you will get a manufacturer's information card. The card has information about your port. Keep this card with you at all times.   °· Know what kind of port you have. There are many types of ports available.   °· Wear a medical alert bracelet in case of an emergency. This can help alert health care workers that you have a port.   °· The port can stay in for as long as your health care provider believes it is necessary.   °· A home health care nurse may give medicines and take care of the port.   °· You or a family member can get special training and directions for giving medicine and taking care of the port at home.   °SEEK MEDICAL CARE IF:  °· Your port does not flush or you are unable to get a blood return.   °· You have a fever or chills. °SEEK IMMEDIATE MEDICAL CARE IF: °· You have new fluid or pus coming from your incision.   °· You notice a bad smell coming from your incision site.   °· You have swelling, pain, or more redness at the incision or port site.   °· You have chest pain or shortness of breath. °  °This  information is not intended to replace advice given to you by your health care provider. Make sure you discuss any questions you have with your health care provider. °  °Document Released: 10/14/2012 Document Revised: 12/29/2012 Document Reviewed: 10/14/2012 °Elsevier Interactive Patient Education ©2016 [employer] °Moderate Conscious Sedation, Adult, Care After °Refer to this sheet in the next few weeks. These instructions provide you with information on caring for yourself after your procedure. Your health care provider may also give you more specific instructions. Your treatment has been planned according to current medical practices, but problems sometimes occur. Call your health care provider if you have any problems or questions after your   procedure. °WHAT TO EXPECT AFTER THE PROCEDURE  °After your procedure: °· You may feel sleepy, clumsy, and have poor balance for several hours. °· Vomiting may occur if you eat too soon after the procedure. °HOME CARE INSTRUCTIONS °· Do not participate in any activities where you could become injured for at least 24 hours. Do not: °¨ Drive. °¨ Swim. °¨ Ride a bicycle. °¨ Operate heavy machinery. °¨ Cook. °¨ Use power tools. °¨ Climb ladders. °¨ Work from a high place. °· Do not make important decisions or sign legal documents until you are improved. °· If you vomit, drink water, juice, or soup when you can drink without vomiting. Make sure you have little or no nausea before eating solid foods. °· Only take over-the-counter or prescription medicines for pain, discomfort, or fever as directed by your health care provider. °· Make sure you and your family fully understand everything about the medicines given to you, including what side effects may occur. °· You should not drink alcohol, take sleeping pills, or take medicines that cause drowsiness for at least 24 hours. °· If you smoke, do not smoke without supervision. °· If you are feeling better, you may resume normal  activities 24 hours after you were sedated. °· Keep all appointments with your health care provider. °SEEK MEDICAL CARE IF: °· Your skin is pale or bluish in color. °· You continue to feel nauseous or vomit. °· Your pain is getting worse and is not helped by medicine. °· You have bleeding or swelling. °· You are still sleepy or feeling clumsy after 24 hours. °SEEK IMMEDIATE MEDICAL CARE IF: °· You develop a rash. °· You have difficulty breathing. °· You develop any type of allergic problem. °· You have a fever. °MAKE SURE YOU: °· Understand these instructions. °· Will watch your condition. °· Will get help right away if you are not doing well or get worse. °  °This information is not intended to replace advice given to you by your health care provider. Make sure you discuss any questions you have with your health care provider. °  °Document Released: 10/14/2012 Document Revised: 01/14/2014 Document Reviewed: 10/14/2012 °Elsevier Interactive Patient Education ©2016 [employer]. ° °

## 2015-10-05 NOTE — Consult Note (Signed)
Chief Complaint: Patient was seen in consultation today for port a cath placement Referring Physician(s): Ennever,Peter R  Supervising Physician: Marybelle Killings  Patient Status: Outpatient  History of Present Illness: Gary Yates. is a 82 y.o. 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  Past Medical History:  Diagnosis Date  . Anxiety   . Atrial fibrillation ([place])   . COPD (chronic obstructive pulmonary disease) ([place])    pt. denies  . Coronary artery disease    a. h/o Overlapping stents RCA;  b. 06/2011 Cath: patent stents, nonobs dzs, NL EF.  . Diabetic peripheral neuropathy ([place])   . Diffuse non-Hodgkin's lymphoma of testis ([place]) 09/28/2015  . DM (diabetes mellitus) ([place])    Type 2, peripheral neuropathy.  . Dyspnea    with exertion  . Dysrhythmia   . GERD (gastroesophageal reflux disease)   . History of bronchitis   . History of kidney stones   . Hyperlipidemia   . Hypertension   . Low testosterone   . Nephrolithiasis   . Osteoarthritis    shoulder  . Restless leg   . SVT (supraventricular tachycardia) ([place])   . Urinary frequency     Past Surgical History:  Procedure Laterality Date  . [place]  . CARDIAC CATHETERIZATION  01/2013  . CHOLECYSTECTOMY    . COLONOSCOPY    . CORONARY ANGIOPLASTY  2004  . EYE SURGERY Bilateral    cataracts  . LEFT HEART CATHETERIZATION WITH CORONARY ANGIOGRAM N/A 06/18/2011   Procedure: LEFT HEART CATHETERIZATION WITH CORONARY ANGIOGRAM;  Surgeon: Peter M Martinique, MD;  Location: [facility] CATH LAB;  Service: Cardiovascular;  Laterality: N/A;  . LEFT HEART CATHETERIZATION WITH CORONARY ANGIOGRAM N/A 01/27/2013   Procedure: LEFT HEART CATHETERIZATION WITH CORONARY ANGIOGRAM;  Surgeon: Burnell Blanks, MD;  Location[facility] CATH LAB;  Service: Cardiovascular;  Laterality: N/A;  . LUMBAR LAMINECTOMY/DECOMPRESSION  MICRODISCECTOMY N/A 02/07/2015   Procedure: Lumbar three-Sacral one Decompression;  Surgeon: Kevan Ny Ditty, MD;  Location: [place] NEURO ORS;  Service: Neurosurgery;  Laterality: N/A;  L3 to S1 Decompression  . ORCHIECTOMY Right 09/01/2015   Procedure: RIGHT ORCHIECTOMY;  Surgeon: Kathie Rhodes, MD;  Location: [facility] ORS;  Service: Urology;  Laterality: Right;  . ROTATOR CUFF REPAIR Left     Allergies: Ace inhibitors; Codeine; and Penicillins  Medications: Prior to Admission medications   Medication Sig Start Date End Date Taking? Authorizing Provider  acetaminophen (TYLENOL) 500 MG tablet Take 500 mg by mouth every 6 (six) hours as needed for mild pain.   Yes Historical Provider, MD  atorvastatin (LIPITOR) 40 MG tablet TAKE 1 TABLET BY MOUTH DAILY 02/02/15  Yes [name], MD  clonazePAM (KLONOPIN) 1 MG tablet TAKE 1 TABLET EVERY DAY AT BEDTIME AS NEEDED 06/23/15  Yes [name], MD  gabapentin (NEURONTIN) 300 MG capsule Take 2 capsules by mouth 3  times a day Patient taking differently: one capsule twice daily 07/13/15  Yes [name], MD  HYDROcodone-acetaminophen (NORCO) 10-325 MG tablet Take 1-2 tablets by mouth every 4 (four) hours as needed for moderate pain. Maximum dose per 24 hours - 8 pills 09/01/15  Yes Kathie Rhodes, MD  Insulin Glargine (TOUJEO SOLOSTAR) 300 UNIT/ML SOPN Inject 20 Units into the skin daily after breakfast.    Yes Historical Provider, MD  insulin lispro (HUMALOG) 100 UNIT/ML injection Take 1 times daily with meals as per  sliding scale.   Yes Historical Provider, MD  oxybutynin (DITROPAN XL) 15 MG 24 hr tablet  03/27/15  Yes Historical Provider, MD  pantoprazole (PROTONIX) 40 MG tablet TAKE 1 TABLET EVERY DAY 03/13/15  Yes [name], MD  pramipexole (MIRAPEX) 1.5 MG tablet MAY TAKE 1 TO 1 &1/2 TABLETS BY MOUTH AT BEDTIME 08/18/15  Yes [name], MD  tamsulosin (FLOMAX) 0.4 MG CAPS capsule Take 0.4 mg by mouth daily as needed. For fluid  01/04/14  Yes Historical Provider, MD  ACCU-CHEK AVIVA PLUS test strip TEST 3 TIMES A DAY    [name], MD  clopidogrel (PLAVIX) 75 MG tablet TAKE 1 TABLET DAILY WITH BREAKFAST 01/28/15   [name], MD  fenofibrate micronized (LOFIBRA) 134 MG capsule Take 1 capsule by mouth  daily before breakfast 08/24/15   [name], MD  glucose blood (ACCU-CHEK AVIVA PLUS) test strip TEST 3 TIMES A DAY 01/19/14   [name], MD  lidocaine-prilocaine (EMLA) cream Apply to affected area once 10/03/15   Volanda Napoleon, MD  LORazepam (ATIVAN) 0.5 MG tablet Take 1 tablet (0.5 mg total) by mouth every 6 (six) hours as needed (Nausea or vomiting). 10/03/15   Volanda Napoleon, MD  metFORMIN (GLUCOPHAGE) 500 MG tablet TAKE 1 TABLET (500 MG TOTAL) BY MOUTH 2 (TWO) TIMES DAILY WITH A MEAL. 08/18/15   [name], MD  metoprolol succinate (TOPROL-XL) 100 MG 24 hr tablet TAKE 1 TABLET EVERY DAY IMMEDIATELY FOLLOWING A MEAL 02/27/15   [name], MD  nitroGLYCERIN (NITROSTAT) 0.4 MG SL tablet Place 1 tablet (0.4 mg total) under the tongue every 5 (five) minutes as needed. Chest pain 01/20/14   Burnell Blanks, MD  ondansetron (ZOFRAN) 8 MG tablet Take 1 tablet (8 mg total) by mouth 2 (two) times daily as needed for refractory nausea / vomiting. Start on day 3 after cyclophosphamide chemotherapy. 10/03/15   Volanda Napoleon, MD  predniSONE (DELTASONE) 20 MG tablet Take 3 tablets (60 mg total) by mouth daily. Take on days 1-5 of chemotherapy. 10/03/15   Volanda Napoleon, MD  prochlorperazine (COMPAZINE) 10 MG tablet Take 1 tablet (10 mg total) by mouth every 6 (six) hours as needed (Nausea or vomiting). 10/03/15   Volanda Napoleon, MD  Trospium Chloride 60 MG CP24  08/14/15   Historical Provider, MD  vitamin B-12 (CYANOCOBALAMIN) 1000 MCG tablet Take 1 tablet (1,000 mcg total) by mouth daily. 06/12/15   [name], MD  warfarin (COUMADIN) 5 MG tablet Take 1 tablet (5 mg total) by mouth  daily. Except on Mon, Wed, Fri take 7.5 mg 07/20/15   [name], MD     Family History  Problem Relation Age of Onset  . Alzheimer's disease Mother   . Heart disease Mother   . Heart disease Father   . Migraines Father   . Ulcers Father   . Coronary artery disease      Male 1st degree relative <50  . Coronary artery disease      male 1st degree relative <60  . Heart disease Sister   . Obesity Sister     Morbid  . Arthritis Sister   . Heart disease Brother   . Arthritis Brother   . Sleep apnea Son   . Obesity Son   . Migraines Daughter   . Thyroid disease Daughter     Social History   Social History  . Marital status:  Married    Spouse name: N/A  . Number of children: 3  . Years of education: N/A   Occupational History  . Retired Retired   Social History Main Topics  . Smoking status: Former Smoker    Packs/day: 1.50    Years: 20.00    Types: Cigarettes    Quit date: 04/05/1983  . Smokeless tobacco: Never Used  . Alcohol use No  . Drug use: No  . Sexual activity: Not Currently   Other Topics Concern  . None   Social History Narrative  . None     Review of Systems currently denies fever, headache, chest pain, abdominal/back pain, nausea, vomiting or abnormal bleeding. He does have some dyspnea with exertion and cough as well as rt knee discomfort.  Vital Signs: BP (!) 132/94 (BP Location: Left Arm)   Pulse 70   Temp 98.1 F (36.7 C) (Oral)   Resp 18   SpO2 96%   Physical Exam patient awake, alert. Chest - CTA bilat; heart with irreg irregular rhythm; abd obese ,soft,+BS,NT; LE- 1+ edema bilat  Mallampati Score:     Imaging: Nm Pet Image Initial (pi) Skull Base To Thigh  Result Date: 09/22/2015 CLINICAL DATA:  Initial treatment strategy for testicular non-Hodgkin's lymphoma. Right orchidectomy 09/01/2015. EXAM: NUCLEAR MEDICINE PET SKULL BASE TO THIGH TECHNIQUE: 11.9 mCi F-18 FDG was injected intravenously. Full-ring PET imaging was  performed from the skull base to thigh after the radiotracer. CT data was obtained and used for attenuation correction and anatomic localization. FASTING BLOOD GLUCOSE:  Value: 155 mg/dl COMPARISON:  Chest CT 02/04/2013 FINDINGS: NECK No hypermetabolic cervical lymph nodes are identified.There are no lesions of the pharyngeal mucosal space. CHEST There are no hypermetabolic mediastinal, hilar or axillary lymph nodes. No suspicious pulmonary activity. There are no suspicious pulmonary nodules. There is chronic scarring or atelectasis in the lingula. Atherosclerosis of the aorta and coronary arteries noted. ABDOMEN/PELVIS There is no hypermetabolic activity within the liver, spleen or pancreas. There is a hypermetabolic left adrenal nodule measuring 2.7 x 2.2 cm on image 113. This measures 36 HU and demonstrates an SUV max of 19.2. The left adrenal gland was incompletely visualized on previous chest CT, although this nodule is probably new. It does appear new compared with an older chest CT from 2007. The right adrenal gland appears normal. There is no hypermetabolic adenopathy within the abdomen or pelvis. There is focal hypermetabolic activity superiorly within the right scrotum (SUV max 25.9). This corresponds with ill-defined increased density along the right spermatic cord, measuring up to 2.3 cm on image 216. Low-density renal cysts are present bilaterally. There is atherosclerosis of the aorta and its side branches. The prostate gland is moderately enlarged. SKELETON There is no hypermetabolic activity to suggest osseous metastatic disease. Low-level activity in the lower thoracic spine localizes to the disc space and appears degenerative. IMPRESSION: 1. Focal hypermetabolic activity superiorly in the right scrotum. This could be postsurgical in etiology, although demonstrates higher than expected metabolic activity and is suspicious for residual tumor. 2. Hypermetabolic left adrenal nodule. This could reflect  lymphomatous involvement or a metastasis. This nodule is probably new compared with prior studies, although was incompletely imaged on most recent prior chest CT. 3. No other suspicious findings. 4. Incidental findings including atherosclerosis, renal cysts and prostatomegaly. Electronically Signed   By: Richardean Sale M.D.   On: 09/22/2015 14:25    Labs:  CBC:  Recent Labs  03/01/15 1218 08/29/15 1100 09/28/15 1459 10/05/15  0755  WBC 5.8 5.1 6.5 5.4  HGB 11.4* 11.3* 11.6* 11.3*  HCT 33.9* 34.4* 34.8* 34.5*  PLT 248.0 234 205 235    COAGS:  Recent Labs  07/24/15 08/29/15 1100 09/01/15 0545 10/05/15 0755  INR 2.9 2.77 1.43 0.98  APTT  --   --   --  32    BMP:  Recent Labs  02/01/15 1212  05/10/15 1212 08/29/15 1100 09/28/15 1459 10/05/15 0755  NA 135  < > 137 135 135 134*  K 4.2  < > 4.2 4.0 4.1 3.9  CL 104  < > 104 103 104 102  CO2 24  < > 25 25 25 25   GLUCOSE 284*  < > 123* 178* 182* 151*  BUN 13  < > 18 20 17  21*  CALCIUM 9.7  < > 9.7 9.5 9.4 9.3  CREATININE 1.20  < > 1.20 1.07 1.07 1.33*  GFRNONAA 55*  --   --  >60 65 49*  GFRAA >60  --   --  >60 75 57*  < > = values in this interval not displayed.  LIVER FUNCTION TESTS:  Recent Labs  12/05/14 1126 03/01/15 1218 05/10/15 1212 09/28/15 1459  BILITOT 0.6 0.4 0.5 0.2  AST 26 23 23 19   ALT 13 9 11 8   ALKPHOS 79 54 49 83  PROT 6.8 6.3 6.6 7.0  ALBUMIN 4.0 3.7 4.1 3.9    TUMOR MARKERS: No results for input(s): AFPTM, CEA, CA199, CHROMGRNA in the last 8760 hours.  Assessment and Plan: 82 y.o. 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      Thank you for this interesting consult.  I greatly enjoyed meeting Gary Yates. and look forward to participating in their care.  A copy of this report was sent to the requesting provider on this date.  Electronically Signed: D. Rowe Robert 10/05/2015, 8:40 AM   I spent a total of 25 minutes in face to face in clinical consultation, greater than 50% of which was counseling/coordinating care for port a cath placement

## 2015-10-05 NOTE — Procedures (Signed)
RIJV PAC SVC RA No comp/EBL 

## 2015-10-06 ENCOUNTER — Ambulatory Visit (HOSPITAL_BASED_OUTPATIENT_CLINIC_OR_DEPARTMENT_OTHER): Payer: Medicare Other

## 2015-10-06 VITALS — BP 148/76 | HR 86 | Temp 97.8°F | Resp 18

## 2015-10-06 DIAGNOSIS — Z5189 Encounter for other specified aftercare: Secondary | ICD-10-CM

## 2015-10-06 DIAGNOSIS — J202 Acute bronchitis due to streptococcus: Secondary | ICD-10-CM

## 2015-10-06 DIAGNOSIS — Z5112 Encounter for antineoplastic immunotherapy: Secondary | ICD-10-CM | POA: Diagnosis not present

## 2015-10-06 DIAGNOSIS — Z5111 Encounter for antineoplastic chemotherapy: Secondary | ICD-10-CM

## 2015-10-06 DIAGNOSIS — C8339 Diffuse large B-cell lymphoma, extranodal and solid organ sites: Secondary | ICD-10-CM | POA: Diagnosis not present

## 2015-10-06 DIAGNOSIS — C8589 Other specified types of non-Hodgkin lymphoma, extranodal and solid organ sites: Secondary | ICD-10-CM

## 2015-10-06 MED ORDER — ACETAMINOPHEN 325 MG PO TABS
650.0000 mg | ORAL_TABLET | Freq: Once | ORAL | Status: AC
  Administered 2015-10-06: 650 mg via ORAL

## 2015-10-06 MED ORDER — DIPHENHYDRAMINE HCL 25 MG PO CAPS
50.0000 mg | ORAL_CAPSULE | Freq: Once | ORAL | Status: AC
  Administered 2015-10-06: 50 mg via ORAL

## 2015-10-06 MED ORDER — PEGFILGRASTIM 6 MG/0.6ML ~~LOC~~ PSKT
PREFILLED_SYRINGE | SUBCUTANEOUS | Status: AC
  Filled 2015-10-06: qty 0.6

## 2015-10-06 MED ORDER — SODIUM CHLORIDE 0.9% FLUSH
10.0000 mL | INTRAVENOUS | Status: DC | PRN
  Administered 2015-10-06: 10 mL
  Filled 2015-10-06: qty 10

## 2015-10-06 MED ORDER — DIPHENHYDRAMINE HCL 25 MG PO CAPS
ORAL_CAPSULE | ORAL | Status: AC
  Filled 2015-10-06: qty 2

## 2015-10-06 MED ORDER — SODIUM CHLORIDE 0.9 % IV SOLN
Freq: Once | INTRAVENOUS | Status: AC
  Administered 2015-10-06: 09:00:00 via INTRAVENOUS

## 2015-10-06 MED ORDER — PEGFILGRASTIM 6 MG/0.6ML ~~LOC~~ PSKT
6.0000 mg | PREFILLED_SYRINGE | Freq: Once | SUBCUTANEOUS | Status: AC
  Administered 2015-10-06: 6 mg via SUBCUTANEOUS

## 2015-10-06 MED ORDER — ACETAMINOPHEN 325 MG PO TABS
ORAL_TABLET | ORAL | Status: AC
  Filled 2015-10-06: qty 2

## 2015-10-06 MED ORDER — VINCRISTINE SULFATE CHEMO INJECTION 1 MG/ML
1.0000 mg | Freq: Once | INTRAVENOUS | Status: AC
  Administered 2015-10-06: 1 mg via INTRAVENOUS
  Filled 2015-10-06: qty 1

## 2015-10-06 MED ORDER — HEPARIN SOD (PORK) LOCK FLUSH 100 UNIT/ML IV SOLN
500.0000 [IU] | Freq: Once | INTRAVENOUS | Status: AC | PRN
  Administered 2015-10-06: 500 [IU]
  Filled 2015-10-06: qty 5

## 2015-10-06 MED ORDER — PALONOSETRON HCL INJECTION 0.25 MG/5ML
INTRAVENOUS | Status: AC
  Filled 2015-10-06: qty 5

## 2015-10-06 MED ORDER — CYCLOPHOSPHAMIDE CHEMO INJECTION 1 GM
562.5000 mg/m2 | Freq: Once | INTRAMUSCULAR | Status: AC
  Administered 2015-10-06: 1340 mg via INTRAVENOUS
  Filled 2015-10-06: qty 67

## 2015-10-06 MED ORDER — PALONOSETRON HCL INJECTION 0.25 MG/5ML
0.2500 mg | Freq: Once | INTRAVENOUS | Status: AC
  Administered 2015-10-06: 0.25 mg via INTRAVENOUS

## 2015-10-06 MED ORDER — SODIUM CHLORIDE 0.9 % IV SOLN
375.0000 mg/m2 | Freq: Once | INTRAVENOUS | Status: AC
  Administered 2015-10-06: 900 mg via INTRAVENOUS
  Filled 2015-10-06: qty 50

## 2015-10-06 MED ORDER — DOXORUBICIN HCL CHEMO IV INJECTION 2 MG/ML
37.5000 mg/m2 | Freq: Once | INTRAVENOUS | Status: AC
  Administered 2015-10-06: 90 mg via INTRAVENOUS
  Filled 2015-10-06: qty 45

## 2015-10-06 MED ORDER — SODIUM CHLORIDE 0.9 % IV SOLN
10.0000 mg | Freq: Once | INTRAVENOUS | Status: AC
  Administered 2015-10-06: 10 mg via INTRAVENOUS
  Filled 2015-10-06: qty 1

## 2015-10-06 MED ORDER — CEFDINIR 300 MG PO CAPS: 300.0000 mg | ORAL_CAPSULE | Freq: Two times a day (BID) | ORAL | 5 refills | Status: DC

## 2015-10-06 NOTE — Patient Instructions (Addendum)
[place] Discharge Instructions for Patients Receiving Chemotherapy  Today you received the following chemotherapy agents Rituxan , Cytoxan , Doxorubicin and vincristine.  Take home medications for Nausea:  1) Ondansetron (ZOFRAN) 8 MG tablet      Take 1 tablet (8 mg total) by mouth 2 (two) times daily. Start on day 3 after chemo (Start on Monday). Then take as needed for nausea or vomiting.        2) PredniSONE (DELTASONE) 20 MG tablet     Take 3 tablets (60 mg total) by mouth daily. Take on days 1-5 of chemotherapy. (take until tuesday).        3) Prochlorperazine (COMPAZINE) 10 MG tablet      Take 1 tablet (10 mg total) by mouth every 6 (six) hours as needed (Nausea or vomiting).    4) Lorazepam (ATIVAN) 0.5 MG tablet         Take 1 tablet (0.5 mg total) by mouth every 6 (six) hours as needed (Nausea or vomiting).           To help prevent nausea and vomiting after your treatment, we encourage you to take your nausea medication   If you develop nausea and vomiting that is not controlled by your nausea medication, call the clinic. If it is after clinic hours your family physician or the after hours number for the clinic or go to the Emergency Department.   BELOW ARE SYMPTOMS THAT SHOULD BE REPORTED IMMEDIATELY:  *FEVER GREATER THAN 100.5 F  *CHILLS WITH OR WITHOUT FEVER  NAUSEA AND VOMITING THAT IS NOT CONTROLLED WITH YOUR NAUSEA MEDICATION  *UNUSUAL SHORTNESS OF BREATH  *UNUSUAL BRUISING OR BLEEDING  TENDERNESS IN MOUTH AND THROAT WITH OR WITHOUT PRESENCE OF ULCERS  *URINARY PROBLEMS  *BOWEL PROBLEMS  UNUSUAL RASH Items with * indicate a potential emergency and should be followed up as soon as possible.  One of the nurses will contact you 24 hours after your treatment. Please let the nurse know about any problems that you may have experienced. Feel free to call the clinic you have any questions or concerns. The clinic phone number is [phone]   I have been informed and understand all the instructions given to me. I know to contact the clinic, my physician, or go to the Emergency Department if any problems should occur. I do not have any questions at this time, but understand that I may call the clinic during office hours   should I have any questions or need assistance in obtaining follow up care.    __________________________________________  _____________  __________ Signature of Patient or Authorized Representative            Date                   Time    __________________________________________ Nurse's Signature   Rituximab injection What is this medicine? RITUXIMAB (ri TUX i mab) is a monoclonal antibody. This medicine changes the way the body's immune system works. It is used commonly to treat non-Hodgkin's lymphoma and other conditions. In cancer cells, this drug targets a specific protein within cancer cells and stops the cancer cells from growing. It is also used to treat rhuematoid arthritis (RA). In RA, this medicine slow the inflammatory process and help reduce joint pain and swelling. This medicine is often used with other cancer or arthritis medications. This medicine may be used for other purposes; ask your health care provider or pharmacist if you  have questions. What should I tell my health care provider before I take this medicine? They need to know if you have any of these conditions: -blood disorders -heart disease -history of hepatitis B -infection (especially a virus infection such as chickenpox, cold sores, or herpes) -irregular heartbeat -kidney disease -lung or breathing disease, like asthma -lupus -an unusual or allergic reaction to rituximab, mouse proteins, other medicines, foods, dyes, or preservatives -pregnant or trying to get pregnant -breast-feeding How should I use this medicine? This medicine is for infusion into a vein. It is administered in a hospital or clinic by a  specially trained health care professional. A special MedGuide will be given to you by the pharmacist with each prescription and refill. Be sure to read this information carefully each time. Talk to your pediatrician regarding the use of this medicine in children. This medicine is not approved for use in children. Overdosage: If you think you have taken too much of this medicine contact a poison control center or emergency room at once. NOTE: This medicine is only for you. Do not share this medicine with others. What if I miss a dose? It is important not to miss a dose. Call your doctor or health care professional if you are unable to keep an appointment. What may interact with this medicine? -cisplatin -medicines for blood pressure -some other medicines for arthritis -vaccines This list may not describe all possible interactions. Give your health care provider a list of all the medicines, herbs, non-prescription drugs, or dietary supplements you use. Also tell them if you smoke, drink alcohol, or use illegal drugs. Some items may interact with your medicine. What should I watch for while using this medicine? Report any side effects that you notice during your treatment right away, such as changes in your breathing, fever, chills, dizziness or lightheadedness. These effects are more common with the first dose. Visit your prescriber or health care professional for checks on your progress. You will need to have regular blood work. Report any other side effects. The side effects of this medicine can continue after you finish your treatment. Continue your course of treatment even though you feel ill unless your doctor tells you to stop. Call your doctor or health care professional for advice if you get a fever, chills or sore throat, or other symptoms of a cold or flu. Do not treat yourself. This drug decreases your body's ability to fight infections. Try to avoid being around people who are sick. This  medicine may increase your risk to bruise or bleed. Call your doctor or health care professional if you notice any unusual bleeding. Be careful brushing and flossing your teeth or using a toothpick because you may get an infection or bleed more easily. If you have any dental work done, tell your dentist you are receiving this medicine. Avoid taking products that contain aspirin, acetaminophen, ibuprofen, naproxen, or ketoprofen unless instructed by your doctor. These medicines may hide a fever. Do not become pregnant while taking this medicine. Women should inform their doctor if they wish to become pregnant or think they might be pregnant. There is a potential for serious side effects to an unborn child. Talk to your health care professional or pharmacist for more information. Do not breast-feed an infant while taking this medicine. What side effects may I notice from receiving this medicine? Side effects that you should report to your doctor or health care professional as soon as possible: -allergic reactions like skin rash, itching or  hives, swelling of the face, lips, or tongue -low blood counts - this medicine may decrease the number of white blood cells, red blood cells and platelets. You may be at increased risk for infections and bleeding. -signs of infection - fever or chills, cough, sore throat, pain or difficulty passing urine -signs of decreased platelets or bleeding - bruising, pinpoint red spots on the skin, black, tarry stools, blood in the urine -signs of decreased red blood cells - unusually weak or tired, fainting spells, lightheadedness -breathing problems -confused, not responsive -chest pain -fast, irregular heartbeat -feeling faint or lightheaded, falls -mouth sores -redness, blistering, peeling or loosening of the skin, including inside the mouth -stomach pain -swelling of the ankles, feet, or hands -trouble passing urine or change in the amount of urine Side effects that  usually do not require medical attention (report to your doctor or other health care professional if they continue or are bothersome): -anxiety -headache -loss of appetite -muscle aches -nausea -night sweats This list may not describe all possible side effects. Call your doctor for medical advice about side effects. You may report side effects to FDA at 1-800-FDA-1088. Where should I keep my medicine? This drug is given in a hospital or clinic and will not be stored at home.

## 2015-10-09 ENCOUNTER — Telehealth: Payer: Self-pay | Admitting: *Deleted

## 2015-10-09 NOTE — Telephone Encounter (Signed)
Patient is doing very well. He has had no symptoms r/t his chemo on Friday. He's denied nausea/vomitting and states he is at his baseline. He had picked up all his prn meds prior to chemo on Friday.  His only question was whether or not he could space out his prednisone. Instead of taking 3 pills at once, he wants to take one pill TID. Dr Marin Olp is fine with this alteration. Patient will continue his prednisone TID.   No further questions or concerns. He will call if he had further questions.

## 2015-10-12 ENCOUNTER — Telehealth: Payer: Self-pay | Admitting: *Deleted

## 2015-10-12 MED ORDER — BENZONATATE 200 MG PO CAPS: 200.0000 mg | ORAL_CAPSULE | Freq: Three times a day (TID) | ORAL | 0 refills | Status: DC | PRN

## 2015-10-12 NOTE — Telephone Encounter (Signed)
Patient c/o anxiety. He wants to know if Dr Marin Olp called in something for his anxiety. Asked if he tried the ativan for his anxiety and he appeared to not remember our conversation on Tuesday regarding this same topic. Educated patient again on the use of the lorazepam to help him relax and help his anxiety. Using teachback he acknowledged the teaching. He will try the Ativan and let us know how it works.  Of note, he states he doesn't want to take prednisone on future treatment cycles. Told patient this was something to discuss with Dr Marin Olp. Dr Marin Olp notified of patient's concerns.

## 2015-10-12 NOTE — Telephone Encounter (Signed)
Patient c/o anxiety while on prednisone. He wants to know if he an get a prescription for anxiety medication. Reviewed his medications and the use of his ativan to help control his anxiety. Explained that while it was prescribed for nausea, it also has a calming effect and he should try it. He agreed to try medication.

## 2015-10-12 NOTE — Telephone Encounter (Signed)
Patient c/o cough which hasn't gotten better with the [facility] that Dr Marin Olp prescribed. He would like to try something else.   Dr Marin Olp would like the patient to try tessalon pearls for cough. A prescription will be sent. Patient aware.

## 2015-10-13 ENCOUNTER — Encounter: Payer: Self-pay | Admitting: Hematology & Oncology

## 2015-10-16 ENCOUNTER — Ambulatory Visit (HOSPITAL_BASED_OUTPATIENT_CLINIC_OR_DEPARTMENT_OTHER): Payer: Medicare Other

## 2015-10-16 ENCOUNTER — Telehealth: Payer: Self-pay | Admitting: *Deleted

## 2015-10-16 ENCOUNTER — Encounter: Payer: Self-pay | Admitting: Family

## 2015-10-16 ENCOUNTER — Ambulatory Visit (HOSPITAL_BASED_OUTPATIENT_CLINIC_OR_DEPARTMENT_OTHER): Payer: Medicare Other | Admitting: Family

## 2015-10-16 VITALS — BP 122/50 | HR 86 | Temp 98.0°F | Resp 16 | Ht 70.0 in | Wt 236.8 lb

## 2015-10-16 DIAGNOSIS — B3781 Candidal esophagitis: Secondary | ICD-10-CM

## 2015-10-16 DIAGNOSIS — Z7901 Long term (current) use of anticoagulants: Secondary | ICD-10-CM

## 2015-10-16 DIAGNOSIS — E860 Dehydration: Secondary | ICD-10-CM

## 2015-10-16 DIAGNOSIS — C8339 Diffuse large B-cell lymphoma, extranodal and solid organ sites: Secondary | ICD-10-CM

## 2015-10-16 DIAGNOSIS — B370 Candidal stomatitis: Secondary | ICD-10-CM | POA: Diagnosis not present

## 2015-10-16 DIAGNOSIS — C8589 Other specified types of non-Hodgkin lymphoma, extranodal and solid organ sites: Secondary | ICD-10-CM

## 2015-10-16 LAB — CBC WITH DIFFERENTIAL (CANCER CENTER ONLY)
BASO#: 0 10*3/uL (ref 0.0–0.2)
BASO%: 0.5 % (ref 0.0–2.0)
EOS ABS: 0 10*3/uL (ref 0.0–0.5)
EOS%: 0.5 % (ref 0.0–7.0)
HEMATOCRIT: 35.6 % — AB (ref 38.7–49.9)
HEMOGLOBIN: 12 g/dL — AB (ref 13.0–17.1)
LYMPH#: 1.2 10*3/uL (ref 0.9–3.3)
LYMPH%: 16.5 % (ref 14.0–48.0)
MCH: 30.3 pg (ref 28.0–33.4)
MCHC: 33.7 g/dL (ref 32.0–35.9)
MCV: 90 fL (ref 82–98)
MONO#: 0.4 10*3/uL (ref 0.1–0.9)
MONO%: 5.9 % (ref 0.0–13.0)
NEUT%: 76.6 % (ref 40.0–80.0)
NEUTROS ABS: 5.6 10*3/uL (ref 1.5–6.5)
Platelets: 194 10*3/uL (ref 145–400)
RBC: 3.96 10*6/uL — AB (ref 4.20–5.70)
RDW: 13.8 % (ref 11.1–15.7)
WBC: 7.3 10*3/uL (ref 4.0–10.0)

## 2015-10-16 LAB — COMPREHENSIVE METABOLIC PANEL (CC13)
A/G RATIO: 1.3 (ref 1.2–2.2)
ALBUMIN: 3.7 g/dL (ref 3.5–4.7)
ALT: 7 IU/L (ref 0–44)
AST (SGOT): 14 IU/L (ref 0–40)
Alkaline Phosphatase, S: 75 IU/L (ref 39–117)
BILIRUBIN TOTAL: 0.2 mg/dL (ref 0.0–1.2)
BUN / CREAT RATIO: 14 (ref 10–24)
BUN: 15 mg/dL (ref 8–27)
CALCIUM: 9.4 mg/dL (ref 8.6–10.2)
CHLORIDE: 101 mmol/L (ref 96–106)
Carbon Dioxide, Total: 25 mmol/L (ref 18–29)
Creatinine, Ser: 1.06 mg/dL (ref 0.76–1.27)
GFR, EST AFRICAN AMERICAN: 76 mL/min/{1.73_m2} (ref >59)
GFR, EST NON AFRICAN AMERICAN: 66 mL/min/{1.73_m2} (ref >59)
GLOBULIN, TOTAL: 2.8 g/dL (ref 1.5–4.5)
Glucose: 151 mg/dL — ABNORMAL HIGH (ref 65–99)
POTASSIUM: 4.6 mmol/L (ref 3.5–5.2)
Sodium: 133 mmol/L — ABNORMAL LOW (ref 134–144)
TOTAL PROTEIN: 6.5 g/dL (ref 6.0–8.5)

## 2015-10-16 LAB — PROTIME-INR (CHCC SATELLITE)
INR: 3 (ref 2.0–3.5)
PROTIME: 36 s — AB (ref 10.6–13.4)

## 2015-10-16 MED ORDER — FLUCONAZOLE 100 MG PO TABS: 100.0000 mg | ORAL_TABLET | Freq: Every day | ORAL | 2 refills | Status: DC

## 2015-10-16 MED ORDER — HEPARIN SOD (PORK) LOCK FLUSH 100 UNIT/ML IV SOLN
500.0000 [IU] | Freq: Once | INTRAVENOUS | Status: AC
  Administered 2015-10-16: 500 [IU] via INTRAVENOUS
  Filled 2015-10-16: qty 5

## 2015-10-16 MED ORDER — SODIUM CHLORIDE 0.9% FLUSH
10.0000 mL | INTRAVENOUS | Status: DC | PRN
  Administered 2015-10-16: 10 mL via INTRAVENOUS
  Filled 2015-10-16: qty 10

## 2015-10-16 MED ORDER — SODIUM CHLORIDE 0.9 % IV SOLN
Freq: Once | INTRAVENOUS | Status: AC
  Administered 2015-10-16: 16:00:00 via INTRAVENOUS

## 2015-10-16 NOTE — Patient Instructions (Signed)

## 2015-10-16 NOTE — Telephone Encounter (Signed)
Received call from [name], patient daughter, stating that patient has had a cough, very sore throat and mouth and hasnt been eating or drinking much.  PAtient not feeling well in general.  Spoke with Dr. Marin Olp.  Patient to see Laverna Peace NP today.

## 2015-10-16 NOTE — Telephone Encounter (Signed)
Received call from [facility] pharmacy stating that Diflucan interacts with other medications on patient list.  Reviewed interaction with Plavix and Warfarin. Laverna Peace NP and Dr. Marin Olp notified.  Will monitor INR and note routed to primary care physician.

## 2015-10-16 NOTE — Progress Notes (Signed)
Hematology and Oncology Follow Up Visit  Gary Yates [id number] 25-Jun-1933 82 y.o. 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 is here today with c/o pain in mouth, productive cough with thick white phlegm, white patches in mouth and difficulty swallowing due to the secretions and pain. He has not been able to eat due to this. On exam, he has impressive oral and esophageal candidiasis. This developed after cycle 1 of treatment. His blood sugars have been running high, 244 this morning.  He is not sleeping well which is not a new issue for him. He takes Mirapex at night but has not gotten any relief. He has Klonopin on his home med list for sleep PRN but does not think he has tried taking it. He will check when he gets home.  No fever, chills, n/v, rash, dizziness, chest pain, palpitations, abdominal pain or changes in bowel or bladder habits. No constipation or diarrhea.  So far, he has not had to use his antiemetics (zofran or compazine).  He has had no episodes of bleeding, bruising or petechiae. No lymphadenopathy found on exam.  He has had right knee pain that radiates up to his hip. This has been going on for a while and is also a source of his not being able to sleep. He has an appointment later this week with ortho.  No numbness or tingling in his extremities.   Medications:    Medication List       Accurate as of 10/16/15  2:54 PM. Always use your most recent med list.          ACCU-CHEK AVIVA PLUS test strip Generic drug:  glucose blood TEST 3 TIMES A DAY   glucose blood test strip Commonly known as:  ACCU-CHEK AVIVA PLUS TEST 3 TIMES A DAY   acetaminophen 500 MG tablet Commonly known as:  TYLENOL Take 500 mg by mouth every 6 (six) hours as needed for mild pain.   atorvastatin 40 MG tablet Commonly known as:   LIPITOR TAKE 1 TABLET BY MOUTH DAILY   benzonatate 200 MG capsule Commonly known as:  TESSALON Take 1 capsule (200 mg total) by mouth 3 (three) times daily as needed for cough.   cefdinir 300 MG capsule Commonly known as:  OMNICEF Take 1 capsule (300 mg total) by mouth 2 (two) times daily.   clonazePAM 1 MG tablet Commonly known as:  KLONOPIN TAKE 1 TABLET EVERY DAY AT BEDTIME AS NEEDED   clopidogrel 75 MG tablet Commonly known as:  PLAVIX TAKE 1 TABLET DAILY WITH BREAKFAST   fenofibrate micronized 134 MG capsule Commonly known as:  LOFIBRA Take 1 capsule by mouth  daily before breakfast   gabapentin 300 MG capsule Commonly known as:  NEURONTIN Take 2 capsules by mouth 3  times a day   HYDROcodone-acetaminophen 10-325 MG tablet Commonly known as:  NORCO Take 1-2 tablets by mouth every 4 (four) hours as needed for moderate pain. Maximum dose per 24 hours - 8 pills   insulin lispro 100 UNIT/ML injection Commonly known as:  HUMALOG Take 1 times daily with meals as per sliding scale.   lidocaine-prilocaine cream Commonly known as:  EMLA Apply to affected area once   LORazepam 0.5 MG tablet Commonly known as:  ATIVAN Take 1 tablet (0.5 mg total) by mouth  every 6 (six) hours as needed (Nausea or vomiting).   metFORMIN 500 MG tablet Commonly known as:  GLUCOPHAGE TAKE 1 TABLET (500 MG TOTAL) BY MOUTH 2 (TWO) TIMES DAILY WITH A MEAL.   metoprolol succinate 100 MG 24 hr tablet Commonly known as:  TOPROL-XL TAKE 1 TABLET EVERY DAY IMMEDIATELY FOLLOWING A MEAL   nitroGLYCERIN 0.4 MG SL tablet Commonly known as:  NITROSTAT Place 1 tablet (0.4 mg total) under the tongue every 5 (five) minutes as needed. Chest pain   ondansetron 8 MG tablet Commonly known as:  ZOFRAN Take 1 tablet (8 mg total) by mouth 2 (two) times daily as needed for refractory nausea / vomiting. Start on day 3 after cyclophosphamide chemotherapy.   oxybutynin 15 MG 24 hr tablet Commonly known as:   DITROPAN XL   pantoprazole 40 MG tablet Commonly known as:  PROTONIX TAKE 1 TABLET EVERY DAY   pramipexole 1.5 MG tablet Commonly known as:  MIRAPEX MAY TAKE 1 TO 1 &1/2 TABLETS BY MOUTH AT BEDTIME   predniSONE 20 MG tablet Commonly known as:  DELTASONE Take 3 tablets (60 mg total) by mouth daily. Take on days 1-5 of chemotherapy.   prochlorperazine 10 MG tablet Commonly known as:  COMPAZINE Take 1 tablet (10 mg total) by mouth every 6 (six) hours as needed (Nausea or vomiting).   tamsulosin 0.4 MG Caps capsule Commonly known as:  FLOMAX Take 0.4 mg by mouth daily as needed. For fluid   TOUJEO SOLOSTAR 300 UNIT/ML Sopn Generic drug:  Insulin Glargine Inject 20 Units into the skin daily after breakfast.   Trospium Chloride 60 MG Cp24   vitamin B-12 1000 MCG tablet Commonly known as:  CYANOCOBALAMIN Take 1 tablet (1,000 mcg total) by mouth daily.   warfarin 5 MG tablet Commonly known as:  COUMADIN Take 1 tablet (5 mg total) by mouth daily. Except on Mon, Wed, Fri take 7.5 mg       Allergies:  Allergies  Allergen Reactions  . Ace Inhibitors Other (See Comments)    cough  . Codeine Nausea Only and Rash       . Penicillins Rash    Childhood allergy Has patient had a PCN reaction causing immediate rash, facial/tongue/throat swelling, SOB or lightheadedness with hypotension: Yes Has patient had a PCN reaction causing severe rash involving mucus membranes or skin necrosis: Yes Has patient had a PCN reaction that required hospitalization No Has patient had a PCN reaction occurring within the last 10 years: No If all of the above answers are "NO", then may proceed with Cephalosporin use.     Past Medical History, Surgical history, Social history, and Family History were reviewed and updated.  Review of Systems: All other 10 point review of systems is negative.   Physical Exam:  vitals were not taken for this visit.  Wt Readings from Last 3 Encounters:  09/28/15  254 lb 6.4 oz (115.4 kg)  09/27/15 243 lb (110.2 kg)  09/01/15 244 lb (110.7 kg)    Ocular: Sclerae unicteric, pupils equal, round and reactive to light Ear-nose-throat: Oropharynx clear, dentition fair Lymphatic: No cervical or supraclavicular adenopathy Lungs no rales or rhonchi, good excursion bilaterally Heart regular rate and rhythm, no murmur appreciated Abd soft, nontender, positive bowel sounds MSK no focal spinal tenderness, no joint edema Neuro: non-focal, well-oriented, appropriate affect Breasts:   Lab Results  Component Value Date   WBC 5.4 10/05/2015   HGB 11.3 (L) 10/05/2015   HCT 34.5 (L) 10/05/2015  MCV 90.6 10/05/2015   PLT 235 10/05/2015   No results found for: FERRITIN, IRON, TIBC, UIBC, IRONPCTSAT Lab Results  Component Value Date   RBC 3.81 (L) 10/05/2015   No results found for: KPAFRELGTCHN, LAMBDASER, KAPLAMBRATIO No results found for: IGGSERUM, IGA, IGMSERUM No results found for: Kathrynn Ducking, MSPIKE, SPEI   Chemistry      Component Value Date/Time   NA 134 (L) 10/05/2015 0755   NA 135 09/28/2015 1459   K 3.9 10/05/2015 0755   K 4.1 09/28/2015 1459   CL 102 10/05/2015 0755   CL 104 09/28/2015 1459   CO2 25 10/05/2015 0755   CO2 25 09/28/2015 1459   BUN 21 (H) 10/05/2015 0755   BUN 17 09/28/2015 1459   CREATININE 1.33 (H) 10/05/2015 0755   CREATININE 1.07 09/28/2015 1459      Component Value Date/Time   CALCIUM 9.3 10/05/2015 0755   CALCIUM 9.4 09/28/2015 1459   ALKPHOS 83 09/28/2015 1459   AST 19 09/28/2015 1459   ALT 8 09/28/2015 1459   BILITOT 0.2 09/28/2015 1459      Impression and Plan: [name] is an 82 yo white male diffuse non-Hodgkin's lymphoma of testis. He had a right orchiectomy on 8/25 and has now started chemotherapy. He completed cycle 1 of R-CHOP last week. He presented today symptomatic with oral and esophageal candidiasis. WBC count is 7.3, Hgb stable at 12.0.  His blood  sugars have been running high this week with this morning's being 244. His blood sugar at this time is 151.  I spoke with Dr. Marin Olp and we will try the patient on an antifungal.  He is currently on Coumadin for chronic atrial fib and his INR today is 3. We will monitor his INR weekly for now since he is starting daily Diflucan 100 mg PO daily to prevent any further candidiasis break out while on chemo.  He is also on Plavix and low dose Atorvastatin as well. Both he and his family know to notify our office if the patient experiences any excessive bruising and/or bleeding as well as monitor for signs and symptoms of rhabdomyolysis.  I will route this note to his PCP and cardiologist. If they are not in agreement with him being on Diflucan, we can certainly change his therapy.  He did receive fluids today since he has not been able to stay hydrated with his sore throat.  We will have him cut back on his Prednisone to 20 mg PO TID day 1-3 of chemo.  We will plan to see him back next week for his scheduled follow-up with Dr. Marin Olp, labs and treatment.  Both he and his family know to contact our office with any questions or concerns. We can certainly see him sooner if need be.   Eliezer Bottom, NP 10/9/20172:54 PM

## 2015-10-18 ENCOUNTER — Other Ambulatory Visit: Payer: Medicare Other

## 2015-10-19 ENCOUNTER — Telehealth: Payer: Self-pay | Admitting: *Deleted

## 2015-10-19 NOTE — Telephone Encounter (Signed)
Called patient to check on his status after his sick visit earlier this week. He states he finally feels better and is almost back to normal. He was even able to mow his lawn yesterday. He understands to call the office if he has any further questions.

## 2015-10-23 ENCOUNTER — Telehealth: Payer: Self-pay | Admitting: Family Medicine

## 2015-10-23 ENCOUNTER — Other Ambulatory Visit: Payer: Self-pay | Admitting: Family

## 2015-10-23 ENCOUNTER — Other Ambulatory Visit: Payer: Self-pay | Admitting: Family Medicine

## 2015-10-23 NOTE — Telephone Encounter (Signed)
Pt is aware that sample is in the frig for pick up.

## 2015-10-23 NOTE — Telephone Encounter (Signed)
Pt wants to know if we have samples of Insulin Glargine (TOUJEO SOLOSTAR) 300 UNIT/ML SOPN  [place] and wants to know asap

## 2015-10-24 ENCOUNTER — Telehealth: Payer: Self-pay | Admitting: *Deleted

## 2015-10-24 ENCOUNTER — Other Ambulatory Visit: Payer: Self-pay | Admitting: General Practice

## 2015-10-24 MED ORDER — WARFARIN SODIUM 5 MG PO TABS: 5.0000 mg | ORAL_TABLET | Freq: Every day | ORAL | 1 refills | Status: DC

## 2015-10-24 NOTE — Telephone Encounter (Signed)
Patient calling the office to see what he should do the morning of his next chemo. Reviewed his medication. He will hold his metoprolol since he is receiving rituxan, and he will start his prednisone. He understands these instructions, confirmed with teach back.

## 2015-10-25 ENCOUNTER — Encounter: Payer: Self-pay | Admitting: Pharmacist

## 2015-10-26 ENCOUNTER — Other Ambulatory Visit: Payer: Medicare Other

## 2015-10-26 ENCOUNTER — Ambulatory Visit (HOSPITAL_BASED_OUTPATIENT_CLINIC_OR_DEPARTMENT_OTHER): Payer: Medicare Other | Admitting: Hematology & Oncology

## 2015-10-26 ENCOUNTER — Encounter: Payer: Self-pay | Admitting: Hematology & Oncology

## 2015-10-26 ENCOUNTER — Ambulatory Visit: Payer: Medicare Other

## 2015-10-26 ENCOUNTER — Ambulatory Visit (HOSPITAL_BASED_OUTPATIENT_CLINIC_OR_DEPARTMENT_OTHER): Payer: Medicare Other

## 2015-10-26 VITALS — BP 131/63 | HR 94 | Temp 97.9°F | Resp 16 | Ht 70.0 in | Wt 237.0 lb

## 2015-10-26 VITALS — BP 136/63 | HR 87 | Temp 98.0°F | Resp 20

## 2015-10-26 DIAGNOSIS — B3789 Other sites of candidiasis: Secondary | ICD-10-CM

## 2015-10-26 DIAGNOSIS — I2510 Atherosclerotic heart disease of native coronary artery without angina pectoris: Secondary | ICD-10-CM

## 2015-10-26 DIAGNOSIS — C8589 Other specified types of non-Hodgkin lymphoma, extranodal and solid organ sites: Secondary | ICD-10-CM

## 2015-10-26 DIAGNOSIS — R058 Other specified cough: Secondary | ICD-10-CM

## 2015-10-26 DIAGNOSIS — Z5111 Encounter for antineoplastic chemotherapy: Secondary | ICD-10-CM

## 2015-10-26 DIAGNOSIS — Z5112 Encounter for antineoplastic immunotherapy: Secondary | ICD-10-CM | POA: Diagnosis not present

## 2015-10-26 DIAGNOSIS — C8339 Diffuse large B-cell lymphoma, extranodal and solid organ sites: Secondary | ICD-10-CM

## 2015-10-26 DIAGNOSIS — Z5189 Encounter for other specified aftercare: Secondary | ICD-10-CM | POA: Diagnosis not present

## 2015-10-26 DIAGNOSIS — R05 Cough: Secondary | ICD-10-CM

## 2015-10-26 LAB — CBC WITH DIFFERENTIAL (CANCER CENTER ONLY)
BASO#: 0.1 10*3/uL (ref 0.0–0.2)
BASO%: 0.5 % (ref 0.0–2.0)
EOS ABS: 0 10*3/uL (ref 0.0–0.5)
EOS%: 0.1 % (ref 0.0–7.0)
HCT: 36.3 % — ABNORMAL LOW (ref 38.7–49.9)
HGB: 12.4 g/dL — ABNORMAL LOW (ref 13.0–17.1)
LYMPH#: 1.1 10*3/uL (ref 0.9–3.3)
LYMPH%: 8.2 % — AB (ref 14.0–48.0)
MCH: 30.8 pg (ref 28.0–33.4)
MCHC: 34.2 g/dL (ref 32.0–35.9)
MCV: 90 fL (ref 82–98)
MONO#: 0.6 10*3/uL (ref 0.1–0.9)
MONO%: 4.8 % (ref 0.0–13.0)
NEUT#: 11.4 10*3/uL — ABNORMAL HIGH (ref 1.5–6.5)
NEUT%: 86.4 % — AB (ref 40.0–80.0)
PLATELETS: 289 10*3/uL (ref 145–400)
RBC: 4.02 10*6/uL — ABNORMAL LOW (ref 4.20–5.70)
RDW: 14.5 % (ref 11.1–15.7)
WBC: 13.2 10*3/uL — ABNORMAL HIGH (ref 4.0–10.0)

## 2015-10-26 LAB — CMP (CANCER CENTER ONLY)
ALT(SGPT): 19 U/L (ref 10–47)
AST: 23 U/L (ref 11–38)
Albumin: 3.8 g/dL (ref 3.3–5.5)
Alkaline Phosphatase: 61 U/L (ref 26–84)
BUN: 18 mg/dL (ref 7–22)
CHLORIDE: 97 meq/L — AB (ref 98–108)
CO2: 25 mEq/L (ref 18–33)
Calcium: 9.4 mg/dL (ref 8.0–10.3)
Creat: 1.1 mg/dl (ref 0.6–1.2)
GLUCOSE: 146 mg/dL — AB (ref 73–118)
POTASSIUM: 3.9 meq/L (ref 3.3–4.7)
Sodium: 134 mEq/L (ref 128–145)
TOTAL PROTEIN: 7 g/dL (ref 6.4–8.1)
Total Bilirubin: 0.5 mg/dl (ref 0.20–1.60)

## 2015-10-26 LAB — LACTATE DEHYDROGENASE: LDH: 204 U/L (ref 125–245)

## 2015-10-26 LAB — PROTIME-INR (CHCC SATELLITE)

## 2015-10-26 LAB — PROTHROMBIN TIME (PT)
INR: 6.9 — AB (ref 0.8–1.2)
Prothrombin Time: 67.5 s — ABNORMAL HIGH (ref 9.1–12.0)

## 2015-10-26 MED ORDER — SODIUM CHLORIDE 0.9 % IV SOLN
Freq: Once | INTRAVENOUS | Status: AC
  Administered 2015-10-26: 09:00:00 via INTRAVENOUS

## 2015-10-26 MED ORDER — DIPHENHYDRAMINE HCL 25 MG PO CAPS
ORAL_CAPSULE | ORAL | Status: AC
  Filled 2015-10-26: qty 2

## 2015-10-26 MED ORDER — ACETAMINOPHEN 325 MG PO TABS
ORAL_TABLET | ORAL | Status: AC
  Filled 2015-10-26: qty 2

## 2015-10-26 MED ORDER — HEPARIN SOD (PORK) LOCK FLUSH 100 UNIT/ML IV SOLN
500.0000 [IU] | Freq: Once | INTRAVENOUS | Status: DC | PRN
  Filled 2015-10-26: qty 5

## 2015-10-26 MED ORDER — PALONOSETRON HCL INJECTION 0.25 MG/5ML
INTRAVENOUS | Status: AC
  Filled 2015-10-26: qty 5

## 2015-10-26 MED ORDER — IPRATROPIUM BROMIDE HFA 17 MCG/ACT IN AERS: 2.0000 | INHALATION_SPRAY | RESPIRATORY_TRACT | 12 refills | Status: DC | PRN

## 2015-10-26 MED ORDER — SODIUM CHLORIDE 0.9 % IV SOLN
375.0000 mg/m2 | Freq: Once | INTRAVENOUS | Status: AC
  Administered 2015-10-26: 900 mg via INTRAVENOUS
  Filled 2015-10-26: qty 50

## 2015-10-26 MED ORDER — PALONOSETRON HCL INJECTION 0.25 MG/5ML
0.2500 mg | Freq: Once | INTRAVENOUS | Status: AC
  Administered 2015-10-26: 0.25 mg via INTRAVENOUS

## 2015-10-26 MED ORDER — PEGFILGRASTIM 6 MG/0.6ML ~~LOC~~ PSKT
6.0000 mg | PREFILLED_SYRINGE | Freq: Once | SUBCUTANEOUS | Status: AC
  Administered 2015-10-26: 6 mg via SUBCUTANEOUS

## 2015-10-26 MED ORDER — DOXORUBICIN HCL CHEMO IV INJECTION 2 MG/ML
37.5000 mg/m2 | Freq: Once | INTRAVENOUS | Status: AC
  Administered 2015-10-26: 90 mg via INTRAVENOUS
  Filled 2015-10-26: qty 45

## 2015-10-26 MED ORDER — CYCLOPHOSPHAMIDE CHEMO INJECTION 1 GM
562.5000 mg/m2 | Freq: Once | INTRAMUSCULAR | Status: AC
  Administered 2015-10-26: 1340 mg via INTRAVENOUS
  Filled 2015-10-26: qty 67

## 2015-10-26 MED ORDER — ACETAMINOPHEN 325 MG PO TABS
650.0000 mg | ORAL_TABLET | Freq: Once | ORAL | Status: AC
  Administered 2015-10-26: 650 mg via ORAL

## 2015-10-26 MED ORDER — DEXAMETHASONE SODIUM PHOSPHATE 10 MG/ML IJ SOLN
10.0000 mg | Freq: Once | INTRAMUSCULAR | Status: AC
  Administered 2015-10-26: 10 mg via INTRAVENOUS

## 2015-10-26 MED ORDER — PEGFILGRASTIM 6 MG/0.6ML ~~LOC~~ PSKT
PREFILLED_SYRINGE | SUBCUTANEOUS | Status: AC
  Filled 2015-10-26: qty 0.6

## 2015-10-26 MED ORDER — DEXAMETHASONE SODIUM PHOSPHATE 10 MG/ML IJ SOLN
INTRAMUSCULAR | Status: AC
  Filled 2015-10-26: qty 1

## 2015-10-26 MED ORDER — SODIUM CHLORIDE 0.9% FLUSH
10.0000 mL | INTRAVENOUS | Status: DC | PRN
  Administered 2015-10-26: 10 mL
  Filled 2015-10-26: qty 10

## 2015-10-26 MED ORDER — DIPHENHYDRAMINE HCL 25 MG PO CAPS
50.0000 mg | ORAL_CAPSULE | Freq: Once | ORAL | Status: AC
  Administered 2015-10-26: 50 mg via ORAL

## 2015-10-26 MED ORDER — SODIUM CHLORIDE 0.9 % IV SOLN
1.0000 mg | Freq: Once | INTRAVENOUS | Status: AC
  Administered 2015-10-26: 1 mg via INTRAVENOUS
  Filled 2015-10-26: qty 1

## 2015-10-26 NOTE — Patient Instructions (Addendum)
[facility] Discharge Instructions for Patients Receiving Chemotherapy  Today you received the following chemotherapy agents Rituxan , Cytoxan , Doxorubicin and vincristine.  Take home medications for Nausea:  1) Ondansetron (ZOFRAN) 8 MG tablet      Take 1 tablet (8 mg total) by mouth 2 (two) times daily. Start on day 3 after chemo (Start on Sunday). Then take as needed for nausea or vomiting.        2) PredniSONE (DELTASONE) 20 MG tablet     Take 3 tablets (60 mg total) by mouth daily. Take on days 1-5 of chemotherapy. (take until monday).        3) Prochlorperazine (COMPAZINE) 10 MG tablet      Take 1 tablet (10 mg total) by mouth every 6 (six) hours as needed (Nausea or vomiting).    4) Lorazepam (ATIVAN) 0.5 MG tablet         Take 1 tablet (0.5 mg total) by mouth every 6 (six) hours as needed (Nausea or vomiting).           To help prevent nausea and vomiting after your treatment, we encourage you to take your nausea medication   If you develop nausea and vomiting that is not controlled by your nausea medication, call the clinic. If it is after clinic hours your family physician or the after hours number for the clinic or go to the Emergency Department.   BELOW ARE SYMPTOMS THAT SHOULD BE REPORTED IMMEDIATELY:  *FEVER GREATER THAN 100.5 F  *CHILLS WITH OR WITHOUT FEVER  NAUSEA AND VOMITING THAT IS NOT CONTROLLED WITH YOUR NAUSEA MEDICATION  *UNUSUAL SHORTNESS OF BREATH  *UNUSUAL BRUISING OR BLEEDING  TENDERNESS IN MOUTH AND THROAT WITH OR WITHOUT PRESENCE OF ULCERS  *URINARY PROBLEMS  *BOWEL PROBLEMS  UNUSUAL RASH Items with * indicate a potential emergency and should be followed up as soon as possible.  One of the nurses will contact you 24 hours after your treatment. Please let the nurse know about any problems that you may have experienced. Feel free to call the clinic you have any questions or concerns. The clinic phone number is [phone]   I have been informed and understand all the instructions given to me. I know to contact the clinic, my physician, or go to the Emergency Department if any problems should occur. I do not have any questions at this time, but understand that I may call the clinic during office hours   should I have any questions or need assistance in obtaining follow up care.    __________________________________________  _____________  __________ Signature of Patient or Authorized Representative            Date                   Time    __________________________________________ Nurse's Signature   Rituximab injection What is this medicine? RITUXIMAB (ri TUX i mab) is a monoclonal antibody. This medicine changes the way the body's immune system works. It is used commonly to treat non-Hodgkin's lymphoma and other conditions. In cancer cells, this drug targets a specific protein within cancer cells and stops the cancer cells from growing. It is also used to treat rhuematoid arthritis (RA). In RA, this medicine slow the inflammatory process and help reduce joint pain and swelling. This medicine is often used with other cancer or arthritis medications. This medicine may be used for other purposes; ask your health care provider or pharmacist if you  have questions. What should I tell my health care provider before I take this medicine? They need to know if you have any of these conditions: -blood disorders -heart disease -history of hepatitis B -infection (especially a virus infection such as chickenpox, cold sores, or herpes) -irregular heartbeat -kidney disease -lung or breathing disease, like asthma -lupus -an unusual or allergic reaction to rituximab, mouse proteins, other medicines, foods, dyes, or preservatives -pregnant or trying to get pregnant -breast-feeding How should I use this medicine? This medicine is for infusion into a vein. It is administered in a hospital or clinic by a  specially trained health care professional. A special MedGuide will be given to you by the pharmacist with each prescription and refill. Be sure to read this information carefully each time. Talk to your pediatrician regarding the use of this medicine in children. This medicine is not approved for use in children. Overdosage: If you think you have taken too much of this medicine contact a poison control center or emergency room at once. NOTE: This medicine is only for you. Do not share this medicine with others. What if I miss a dose? It is important not to miss a dose. Call your doctor or health care professional if you are unable to keep an appointment. What may interact with this medicine? -cisplatin -medicines for blood pressure -some other medicines for arthritis -vaccines This list may not describe all possible interactions. Give your health care provider a list of all the medicines, herbs, non-prescription drugs, or dietary supplements you use. Also tell them if you smoke, drink alcohol, or use illegal drugs. Some items may interact with your medicine. What should I watch for while using this medicine? Report any side effects that you notice during your treatment right away, such as changes in your breathing, fever, chills, dizziness or lightheadedness. These effects are more common with the first dose. Visit your prescriber or health care professional for checks on your progress. You will need to have regular blood work. Report any other side effects. The side effects of this medicine can continue after you finish your treatment. Continue your course of treatment even though you feel ill unless your doctor tells you to stop. Call your doctor or health care professional for advice if you get a fever, chills or sore throat, or other symptoms of a cold or flu. Do not treat yourself. This drug decreases your body's ability to fight infections. Try to avoid being around people who are sick. This  medicine may increase your risk to bruise or bleed. Call your doctor or health care professional if you notice any unusual bleeding. Be careful brushing and flossing your teeth or using a toothpick because you may get an infection or bleed more easily. If you have any dental work done, tell your dentist you are receiving this medicine. Avoid taking products that contain aspirin, acetaminophen, ibuprofen, naproxen, or ketoprofen unless instructed by your doctor. These medicines may hide a fever. Do not become pregnant while taking this medicine. Women should inform their doctor if they wish to become pregnant or think they might be pregnant. There is a potential for serious side effects to an unborn child. Talk to your health care professional or pharmacist for more information. Do not breast-feed an infant while taking this medicine. What side effects may I notice from receiving this medicine? Side effects that you should report to your doctor or health care professional as soon as possible: -allergic reactions like skin rash, itching or  hives, swelling of the face, lips, or tongue -low blood counts - this medicine may decrease the number of white blood cells, red blood cells and platelets. You may be at increased risk for infections and bleeding. -signs of infection - fever or chills, cough, sore throat, pain or difficulty passing urine -signs of decreased platelets or bleeding - bruising, pinpoint red spots on the skin, black, tarry stools, blood in the urine -signs of decreased red blood cells - unusually weak or tired, fainting spells, lightheadedness -breathing problems -confused, not responsive -chest pain -fast, irregular heartbeat -feeling faint or lightheaded, falls -mouth sores -redness, blistering, peeling or loosening of the skin, including inside the mouth -stomach pain -swelling of the ankles, feet, or hands -trouble passing urine or change in the amount of urine Side effects that  usually do not require medical attention (report to your doctor or other health care professional if they continue or are bothersome): -anxiety -headache -loss of appetite -muscle aches -nausea -night sweats This list may not describe all possible side effects. Call your doctor for medical advice about side effects. You may report side effects to FDA at 1-800-FDA-1088. Where should I keep my medicine? This drug is given in a hospital or clinic and will not be stored at home.

## 2015-10-26 NOTE — Progress Notes (Signed)
Hematology and Oncology Follow Up Visit  Gary Yates [id number] 1933/03/20 82 y.o. 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 Durfee is back for follow-up. He tolerated first cycle fairly well. He did have some issues with mouth sores. Because of the excessive number of medications that he is on, we cannot put him on Diflucan. We gave him some mouth rinse. I did tell him to gargle with water and baking soda 6 times a day.  He is having a tough time sleeping. I really like to avoid having to give him any more medications since he is on a lot of medicines.  His blood sugars have been on the high side. I did this is from the prednisone.  I think we can probably have him take his own twice a day for 5 days after chemotherapy. I'm a sure that he can really handle 60 mg total dose. I think 40 mg total dose is acceptable.  He is on Coumadin. Tried to manage this has been difficult. Again, he is on quite a few medications., Sure if he can be switched over to one of the NOAC's.  He has had no bleeding. There's been no bruising.  He has had no change in bowel or bladder habits.  His appetite is down a little bit. He is lost a little bit of weight.  Overall, I said that his performance status is ECOG 1-2.  Medications:  Current Outpatient Prescriptions:  .  ACCU-CHEK AVIVA PLUS test strip, TEST 3 TIMES A DAY, Disp: 100 each, Rfl: 3 .  acetaminophen (TYLENOL) 500 MG tablet, Take 500 mg by mouth every 6 (six) hours as needed for mild pain., Disp: , Rfl:  .  atorvastatin (LIPITOR) 40 MG tablet, TAKE 1 TABLET BY MOUTH DAILY, Disp: 90 tablet, Rfl: 1 .  benzonatate (TESSALON) 200 MG capsule, Take 1 capsule (200 mg total) by mouth 3 (three) times daily as needed for cough., Disp: 90 capsule, Rfl: 0 .  cefdinir (OMNICEF) 300 MG capsule, Take 1 capsule (300 mg  total) by mouth 2 (two) times daily., Disp: 30 capsule, Rfl: 5 .  clonazePAM (KLONOPIN) 1 MG tablet, TAKE 1 TABLET EVERY DAY AT BEDTIME AS NEEDED, Disp: 30 tablet, Rfl: 5 .  clopidogrel (PLAVIX) 75 MG tablet, TAKE 1 TABLET DAILY WITH BREAKFAST, Disp: 30 tablet, Rfl: 5 .  fenofibrate micronized (LOFIBRA) 134 MG capsule, Take 1 capsule by mouth  daily before breakfast, Disp: 90 capsule, Rfl: 1 .  fluconazole (DIFLUCAN) 100 MG tablet, Take 1 tablet (100 mg total) by mouth daily., Disp: 30 tablet, Rfl: 2 .  gabapentin (NEURONTIN) 300 MG capsule, Take 2 capsules by mouth 3  times a day (Patient taking differently: one capsule twice daily), Disp: 540 capsule, Rfl: 1 .  glucose blood (ACCU-CHEK AVIVA PLUS) test strip, TEST 3 TIMES A DAY, Disp: 300 each, Rfl: 3 .  HYDROcodone-acetaminophen (NORCO) 10-325 MG tablet, Take 1-2 tablets by mouth every 4 (four) hours as needed for moderate pain. Maximum dose per 24 hours - 8 pills, Disp: 30 tablet, Rfl: 0 .  Insulin Glargine (TOUJEO SOLOSTAR) 300 UNIT/ML SOPN, Inject 20 Units into the skin daily after breakfast. , Disp: , Rfl:  .  insulin lispro (HUMALOG) 100 UNIT/ML injection, Take 1 times daily with meals as per sliding scale., Disp: , Rfl:  .  lidocaine-prilocaine (EMLA) cream, Apply to affected area once, Disp: 30 g, Rfl: 3 .  LORazepam (ATIVAN) 0.5 MG tablet, Take 1 tablet (0.5 mg total) by mouth every 6 (six) hours as needed (Nausea or vomiting)., Disp: 30 tablet, Rfl: 0 .  metFORMIN (GLUCOPHAGE) 500 MG tablet, TAKE 1 TABLET (500 MG TOTAL) BY MOUTH 2 (TWO) TIMES DAILY WITH A MEAL., Disp: 60 tablet, Rfl: 5 .  metoprolol succinate (TOPROL-XL) 100 MG 24 hr tablet, TAKE 1 TABLET EVERY DAY IMMEDIATELY FOLLOWING A MEAL, Disp: 90 tablet, Rfl: 1 .  nitroGLYCERIN (NITROSTAT) 0.4 MG SL tablet, Place 1 tablet (0.4 mg total) under the tongue every 5 (five) minutes as needed. Chest pain, Disp: 25 tablet, Rfl: 6 .  ondansetron (ZOFRAN) 8 MG tablet, Take 1 tablet (8 mg  total) by mouth 2 (two) times daily as needed for refractory nausea / vomiting. Start on day 3 after cyclophosphamide chemotherapy., Disp: 30 tablet, Rfl: 1 .  oxybutynin (DITROPAN XL) 15 MG 24 hr tablet, , Disp: , Rfl:  .  pantoprazole (PROTONIX) 40 MG tablet, TAKE 1 TABLET EVERY DAY, Disp: 90 tablet, Rfl: 2 .  pramipexole (MIRAPEX) 1.5 MG tablet, MAY TAKE 1 TO 1 &1/2 TABLETS BY MOUTH AT BEDTIME, Disp: 60 tablet, Rfl: 5 .  predniSONE (DELTASONE) 20 MG tablet, Take 3 tablets (60 mg total) by mouth daily. Take on days 1-5 of chemotherapy., Disp: 15 tablet, Rfl: 6 .  prochlorperazine (COMPAZINE) 10 MG tablet, Take 1 tablet (10 mg total) by mouth every 6 (six) hours as needed (Nausea or vomiting)., Disp: 30 tablet, Rfl: 6 .  tamsulosin (FLOMAX) 0.4 MG CAPS capsule, Take 0.4 mg by mouth daily as needed. For fluid, Disp: , Rfl:  .  Trospium Chloride 60 MG CP24, , Disp: , Rfl:  .  vitamin B-12 (CYANOCOBALAMIN) 1000 MCG tablet, Take 1 tablet (1,000 mcg total) by mouth daily., Disp: 30 tablet, Rfl: 0 .  warfarin (COUMADIN) 5 MG tablet, Take 1 tablet (5 mg total) by mouth daily. Except on Mon, Wed, Fri take 7.5 mg, Disp: 40 tablet, Rfl: 1 No current facility-administered medications for this visit.   Facility-Administered Medications Ordered in Other Visits:  .  cyclophosphamide (CYTOXAN) 1,340 mg in sodium chloride 0.9 % 250 mL chemo infusion, 562.5 mg/m2 (Treatment Plan Recorded), Intravenous, Once, Volanda Napoleon, MD, Last Rate: 634 mL/hr at 10/26/15 1124, 1,340 mg at 10/26/15 1124 .  heparin lock flush 100 unit/mL, 500 Units, Intracatheter, Once PRN, Volanda Napoleon, MD .  pegfilgrastim (NEULASTA ONPRO KIT) injection 6 mg, 6 mg, Subcutaneous, Once, Volanda Napoleon, MD .  riTUXimab (RITUXAN) 900 mg in sodium chloride 0.9 % 250 mL (2.6471 mg/mL) chemo infusion, 375 mg/m2 (Treatment Plan Recorded), Intravenous, Once, Volanda Napoleon, MD .  sodium chloride flush (NS) 0.9 % injection 10 mL, 10 mL,  Intracatheter, PRN, Volanda Napoleon, MD .  testosterone cypionate (DEPOTESTOTERONE CYPIONATE) injection 200 mg, 200 mg, Intramuscular, Q28 days, [name], MD, 200 mg at 12/26/11 0850 .  testosterone cypionate (DEPOTESTOTERONE CYPIONATE) injection 200 mg, 200 mg, Intramuscular, Q28 days, [name], MD, 200 mg at 01/23/12 0908 .  testosterone cypionate (DEPOTESTOTERONE CYPIONATE) injection 200 mg, 200 mg, Intramuscular, Q28 days, [name], MD, 200 mg at 02/25/12 0848 .  testosterone cypionate (DEPOTESTOTERONE CYPIONATE) injection 200 mg, 200 mg, Intramuscular, Q28 days, [name], MD, 200 mg at 06/03/12 1437  Allergies:  Allergies  Allergen Reactions  . Ace Inhibitors Other (See Comments)  cough  . Codeine Nausea Only and Rash       . Penicillins Rash    Childhood allergy Has patient had a PCN reaction causing immediate rash, facial/tongue/throat swelling, SOB or lightheadedness with hypotension: Yes Has patient had a PCN reaction causing severe rash involving mucus membranes or skin necrosis: Yes Has patient had a PCN reaction that required hospitalization No Has patient had a PCN reaction occurring within the last 10 years: No If all of the above answers are "NO", then may proceed with Cephalosporin use.     Past Medical History, Surgical history, Social history, and Family History were reviewed and updated.  Review of Systems:  As above  Physical Exam:  height is _0  (1.778 m) and weight is 237 lb (107.5 kg). His oral temperature is 97.9 F (36.6 C). His blood pressure is 131/63 and his pulse is 94. His respiration is 16.   Wt Readings from Last 3 Encounters:  10/26/15 237 lb (107.5 kg)  10/16/15 236 lb 12.8 oz (107.4 kg)  09/28/15 254 lb 6.4 oz (115.4 kg)      Elderly white male. He started to lose his hair. Head and neck exam shows no ocular or oral lesions. There are no palpable cervical or supraclavicular lymph nodes. He has no oral  thrush. There is no oral mucositis. There is no palpable adenopathy in the neck. Lungs are clear to percussion and auscultation bilaterally. Cardiac exam regular rate and rhythm consistent with atrial fibrillation. He has no murmurs, rubs or bruits. Abdomen is soft. He has decent bowel sounds. There is no fluid wave. There is no guarding or rebound tenderness. He has no palpable hepatosplenomegaly. He has a healed right inguinal orchiectomy scar. Extremities shows chronic 2+ edema in his legs. Neurological exam shows no focal neurological deficits. Skin exam shows some brawny edema in his lower legs.  Lab Results  Component Value Date   WBC 13.2 (H) 10/26/2015   HGB 12.4 (L) 10/26/2015   HCT 36.3 (L) 10/26/2015   MCV 90 10/26/2015   PLT 289 10/26/2015     Chemistry      Component Value Date/Time   NA 134 10/26/2015 0805   K 3.9 10/26/2015 0805   CL 97 (L) 10/26/2015 0805   CO2 25 10/26/2015 0805   BUN 18 10/26/2015 0805   CREATININE 1.1 10/26/2015 0805      Component Value Date/Time   CALCIUM 9.4 10/26/2015 0805   ALKPHOS 61 10/26/2015 0805   AST 23 10/26/2015 0805   ALT 19 10/26/2015 0805   BILITOT 0.50 10/26/2015 0805         Impression and Plan: Mr. Lenoir is A 82 year old white male. He has testicular lymphoma. He is on systemic chemotherapy. He has a marginal performance status. As such, we can really cannot be all that aggressive with him area and I don't think he would be a great candidate for intrathecal chemotherapy. He would be a candidate for radiation therapy to the scrotal region.  We will go ahead with his treatment. His INR is 6.9. I told him not to take Coumadin today or tomorrow. He will then start on Saturday with a dose of 5 mg a day. We'll then recheck his INR on October 24.  I have a feeling this will be a "work in progress" and that his Coumadin levels will adjust as we go along.  Also, he will not have problems with mouth sores. Maybe the water baking soda  makes will  help.  We will get a PET scan on him in about 2 weeks. I will like to see what kind of results we have had.  I will plan to get him back to see [place] in 3 weeks.  He knows that he come back sooner if there are problems.     Volanda Napoleon, MD 10/19/201711:38 AM

## 2015-10-26 NOTE — Patient Instructions (Signed)

## 2015-10-26 NOTE — Addendum Note (Signed)
Addended by: Burney Gauze R on: 10/26/2015 04:09 PM   Modules accepted: Orders

## 2015-10-27 ENCOUNTER — Ambulatory Visit (INDEPENDENT_AMBULATORY_CARE_PROVIDER_SITE_OTHER): Payer: Self-pay | Admitting: General Practice

## 2015-10-27 ENCOUNTER — Telehealth: Payer: Self-pay | Admitting: General Practice

## 2015-10-27 ENCOUNTER — Other Ambulatory Visit: Payer: Self-pay | Admitting: Family Medicine

## 2015-10-27 DIAGNOSIS — I480 Paroxysmal atrial fibrillation: Secondary | ICD-10-CM

## 2015-10-27 NOTE — Telephone Encounter (Signed)
Attempted to call patient about INR.  No answer.  LMOVM for patient to call Villa Herb, RN @ [phone]

## 2015-10-27 NOTE — Telephone Encounter (Signed)
-----   Message from Elio Forget, [place] sent at 10/27/2015  8:54 AM EDT ----- We need to see if Meriam Sprague is going to be in on 10/20.  If so, forward to her .  If not, let me know so we can address his coumadin dose. ----- Message ----- From: Johny Drilling, RN Sent: 10/26/2015   1:02 PM To: [name], MD  Please contact patient with Coumadin instructions. Educated by Dr Marin Olp to hold today's dose and await instructions from your office. Thank you!

## 2015-10-27 NOTE — Telephone Encounter (Signed)
Attempted to reach patient concerning his INR.  LMOVM to call Villa Herb, RN @ [phone].  Will attempt to call again on Monday , 10-23.

## 2015-10-28 ENCOUNTER — Other Ambulatory Visit: Payer: Self-pay | Admitting: Hematology & Oncology

## 2015-10-28 DIAGNOSIS — C8589 Other specified types of non-Hodgkin lymphoma, extranodal and solid organ sites: Secondary | ICD-10-CM

## 2015-10-30 ENCOUNTER — Other Ambulatory Visit: Payer: Self-pay | Admitting: *Deleted

## 2015-10-31 ENCOUNTER — Other Ambulatory Visit (HOSPITAL_BASED_OUTPATIENT_CLINIC_OR_DEPARTMENT_OTHER): Payer: Medicare Other

## 2015-10-31 ENCOUNTER — Other Ambulatory Visit: Payer: Medicare Other

## 2015-10-31 ENCOUNTER — Ambulatory Visit: Payer: Self-pay | Admitting: General Practice

## 2015-10-31 DIAGNOSIS — I2510 Atherosclerotic heart disease of native coronary artery without angina pectoris: Secondary | ICD-10-CM

## 2015-10-31 DIAGNOSIS — C8589 Other specified types of non-Hodgkin lymphoma, extranodal and solid organ sites: Secondary | ICD-10-CM

## 2015-10-31 DIAGNOSIS — R05 Cough: Secondary | ICD-10-CM

## 2015-10-31 DIAGNOSIS — Z7901 Long term (current) use of anticoagulants: Secondary | ICD-10-CM

## 2015-10-31 DIAGNOSIS — R058 Other specified cough: Secondary | ICD-10-CM

## 2015-10-31 DIAGNOSIS — I480 Paroxysmal atrial fibrillation: Secondary | ICD-10-CM

## 2015-10-31 LAB — PROTIME-INR (CHCC SATELLITE)
INR: 3.2 (ref 2.0–3.5)
Protime: 38.4 Seconds — ABNORMAL HIGH (ref 10.6–13.4)

## 2015-10-31 NOTE — Progress Notes (Signed)
I have reviewed and agree with the plan. 

## 2015-11-03 ENCOUNTER — Encounter (HOSPITAL_COMMUNITY)
Admission: RE | Admit: 2015-11-03 | Discharge: 2015-11-03 | Disposition: A | Discharge status: Home / Self Care | Payer: Medicare Other | Source: Ambulatory Visit | Attending: Hematology & Oncology | Admitting: Hematology & Oncology

## 2015-11-03 DIAGNOSIS — R05 Cough: Secondary | ICD-10-CM | POA: Insufficient documentation

## 2015-11-03 DIAGNOSIS — C8589 Other specified types of non-Hodgkin lymphoma, extranodal and solid organ sites: Secondary | ICD-10-CM | POA: Diagnosis present

## 2015-11-03 DIAGNOSIS — I2510 Atherosclerotic heart disease of native coronary artery without angina pectoris: Secondary | ICD-10-CM | POA: Diagnosis present

## 2015-11-03 DIAGNOSIS — R058 Other specified cough: Secondary | ICD-10-CM

## 2015-11-03 LAB — GLUCOSE, CAPILLARY: GLUCOSE-CAPILLARY: 177 mg/dL — AB (ref 65–99)

## 2015-11-03 MED ORDER — FLUDEOXYGLUCOSE F - 18 (FDG) INJECTION
11.8100 | Freq: Once | INTRAVENOUS | Status: AC | PRN
  Administered 2015-11-03: 11.81 via INTRAVENOUS

## 2015-11-09 ENCOUNTER — Ambulatory Visit (HOSPITAL_BASED_OUTPATIENT_CLINIC_OR_DEPARTMENT_OTHER): Payer: Medicare Other

## 2015-11-09 ENCOUNTER — Other Ambulatory Visit (HOSPITAL_BASED_OUTPATIENT_CLINIC_OR_DEPARTMENT_OTHER): Payer: Medicare Other

## 2015-11-09 ENCOUNTER — Telehealth: Payer: Self-pay | Admitting: *Deleted

## 2015-11-09 VITALS — BP 131/69 | HR 89 | Temp 98.1°F | Resp 18

## 2015-11-09 DIAGNOSIS — B3781 Candidal esophagitis: Secondary | ICD-10-CM

## 2015-11-09 DIAGNOSIS — B370 Candidal stomatitis: Secondary | ICD-10-CM

## 2015-11-09 DIAGNOSIS — C8589 Other specified types of non-Hodgkin lymphoma, extranodal and solid organ sites: Secondary | ICD-10-CM | POA: Diagnosis not present

## 2015-11-09 DIAGNOSIS — Z7901 Long term (current) use of anticoagulants: Secondary | ICD-10-CM

## 2015-11-09 DIAGNOSIS — E860 Dehydration: Secondary | ICD-10-CM

## 2015-11-09 LAB — CMP (CANCER CENTER ONLY)
ALK PHOS: 72 U/L (ref 26–84)
ALT: 18 U/L (ref 10–47)
AST: 21 U/L (ref 11–38)
Albumin: 3.2 g/dL — ABNORMAL LOW (ref 3.3–5.5)
BILIRUBIN TOTAL: 0.4 mg/dL (ref 0.20–1.60)
BUN: 11 mg/dL (ref 7–22)
CO2: 28 meq/L (ref 18–33)
CREATININE: 1 mg/dL (ref 0.6–1.2)
Calcium: 8.9 mg/dL (ref 8.0–10.3)
Chloride: 102 mEq/L (ref 98–108)
GLUCOSE: 182 mg/dL — AB (ref 73–118)
Potassium: 3.6 mEq/L (ref 3.3–4.7)
SODIUM: 137 meq/L (ref 128–145)
Total Protein: 6.2 g/dL — ABNORMAL LOW (ref 6.4–8.1)

## 2015-11-09 LAB — CBC WITH DIFFERENTIAL (CANCER CENTER ONLY)
BASO#: 0.1 10*3/uL (ref 0.0–0.2)
BASO%: 0.6 % (ref 0.0–2.0)
EOS%: 0.4 % (ref 0.0–7.0)
Eosinophils Absolute: 0 10*3/uL (ref 0.0–0.5)
HEMATOCRIT: 31.2 % — AB (ref 38.7–49.9)
HGB: 10.5 g/dL — ABNORMAL LOW (ref 13.0–17.1)
LYMPH#: 0.8 10*3/uL — AB (ref 0.9–3.3)
LYMPH%: 9.7 % — ABNORMAL LOW (ref 14.0–48.0)
MCH: 30.4 pg (ref 28.0–33.4)
MCHC: 33.7 g/dL (ref 32.0–35.9)
MCV: 90 fL (ref 82–98)
MONO#: 0.5 10*3/uL (ref 0.1–0.9)
MONO%: 6.4 % (ref 0.0–13.0)
NEUT#: 6.4 10*3/uL (ref 1.5–6.5)
NEUT%: 82.9 % — AB (ref 40.0–80.0)
PLATELETS: 176 10*3/uL (ref 145–400)
RBC: 3.45 10*6/uL — ABNORMAL LOW (ref 4.20–5.70)
RDW: 15.2 % (ref 11.1–15.7)
WBC: 7.8 10*3/uL (ref 4.0–10.0)

## 2015-11-09 LAB — PROTIME-INR (CHCC SATELLITE)
INR: 2.5 (ref 2.0–3.5)
Protime: 30 Seconds — ABNORMAL HIGH (ref 10.6–13.4)

## 2015-11-09 MED ORDER — HEPARIN SOD (PORK) LOCK FLUSH 100 UNIT/ML IV SOLN
500.0000 [IU] | Freq: Once | INTRAVENOUS | Status: AC
  Administered 2015-11-09: 500 [IU] via INTRAVENOUS
  Filled 2015-11-09: qty 5

## 2015-11-09 MED ORDER — SODIUM CHLORIDE 0.9% FLUSH
10.0000 mL | INTRAVENOUS | Status: DC | PRN
  Administered 2015-11-09: 10 mL via INTRAVENOUS
  Filled 2015-11-09: qty 10

## 2015-11-09 NOTE — Patient Instructions (Signed)

## 2015-11-09 NOTE — Progress Notes (Signed)
Gary Yates. presented for Portacath access and flush. Proper placement of portacath confirmed by CXR. Portacath located in the right chest wall accessed with  H 20 needle. Clean, Dry and Intact Good blood return present. Portacath flushed with 70ml NS and 500U/48ml Heparin per protocol and needle removed intact. Procedure without incident. Patient tolerated procedure well.

## 2015-11-09 NOTE — Telephone Encounter (Addendum)
Patient is aware of results  ----- Message from Volanda Napoleon, MD sent at 11/09/2015  2:09 PM EDT ----- Call - labs and INR are great!!  NO change in coumadin dose!!  Laurey Arrow

## 2015-11-10 ENCOUNTER — Other Ambulatory Visit: Payer: Self-pay | Admitting: Cardiovascular Disease

## 2015-11-16 ENCOUNTER — Ambulatory Visit (HOSPITAL_BASED_OUTPATIENT_CLINIC_OR_DEPARTMENT_OTHER): Payer: Medicare Other

## 2015-11-16 ENCOUNTER — Other Ambulatory Visit (HOSPITAL_BASED_OUTPATIENT_CLINIC_OR_DEPARTMENT_OTHER): Payer: Self-pay

## 2015-11-16 ENCOUNTER — Other Ambulatory Visit (HOSPITAL_BASED_OUTPATIENT_CLINIC_OR_DEPARTMENT_OTHER): Payer: Medicare Other

## 2015-11-16 ENCOUNTER — Ambulatory Visit (HOSPITAL_BASED_OUTPATIENT_CLINIC_OR_DEPARTMENT_OTHER): Payer: Medicare Other | Admitting: Hematology & Oncology

## 2015-11-16 ENCOUNTER — Other Ambulatory Visit: Payer: Self-pay

## 2015-11-16 ENCOUNTER — Ambulatory Visit (HOSPITAL_BASED_OUTPATIENT_CLINIC_OR_DEPARTMENT_OTHER)
Admission: RE | Admit: 2015-11-16 | Discharge: 2015-11-16 | Disposition: A | Discharge status: Home / Self Care | Payer: Medicare Other | Source: Ambulatory Visit | Attending: Hematology & Oncology | Admitting: Hematology & Oncology

## 2015-11-16 VITALS — BP 142/61 | HR 97 | Temp 97.7°F | Resp 16

## 2015-11-16 VITALS — BP 138/75 | HR 92 | Temp 97.9°F | Resp 18 | Wt 236.8 lb

## 2015-11-16 DIAGNOSIS — C8590 Non-Hodgkin lymphoma, unspecified, unspecified site: Secondary | ICD-10-CM | POA: Diagnosis not present

## 2015-11-16 DIAGNOSIS — J9811 Atelectasis: Secondary | ICD-10-CM | POA: Insufficient documentation

## 2015-11-16 DIAGNOSIS — R05 Cough: Secondary | ICD-10-CM | POA: Diagnosis not present

## 2015-11-16 DIAGNOSIS — J42 Unspecified chronic bronchitis: Secondary | ICD-10-CM

## 2015-11-16 DIAGNOSIS — C8339 Diffuse large B-cell lymphoma, extranodal and solid organ sites: Secondary | ICD-10-CM | POA: Diagnosis not present

## 2015-11-16 DIAGNOSIS — Z5112 Encounter for antineoplastic immunotherapy: Secondary | ICD-10-CM

## 2015-11-16 DIAGNOSIS — I2510 Atherosclerotic heart disease of native coronary artery without angina pectoris: Secondary | ICD-10-CM

## 2015-11-16 DIAGNOSIS — I4510 Unspecified right bundle-branch block: Secondary | ICD-10-CM | POA: Diagnosis not present

## 2015-11-16 DIAGNOSIS — C8589 Other specified types of non-Hodgkin lymphoma, extranodal and solid organ sites: Secondary | ICD-10-CM

## 2015-11-16 DIAGNOSIS — Z7901 Long term (current) use of anticoagulants: Secondary | ICD-10-CM

## 2015-11-16 DIAGNOSIS — Z95828 Presence of other vascular implants and grafts: Secondary | ICD-10-CM | POA: Insufficient documentation

## 2015-11-16 DIAGNOSIS — R918 Other nonspecific abnormal finding of lung field: Secondary | ICD-10-CM

## 2015-11-16 DIAGNOSIS — R058 Other specified cough: Secondary | ICD-10-CM

## 2015-11-16 LAB — CMP (CANCER CENTER ONLY)
ALBUMIN: 3.3 g/dL (ref 3.3–5.5)
ALK PHOS: 71 U/L (ref 26–84)
ALT: 17 U/L (ref 10–47)
AST: 22 U/L (ref 11–38)
BILIRUBIN TOTAL: 0.6 mg/dL (ref 0.20–1.60)
BUN, Bld: 13 mg/dL (ref 7–22)
CALCIUM: 8.7 mg/dL (ref 8.0–10.3)
CO2: 27 meq/L (ref 18–33)
CREATININE: 1 mg/dL (ref 0.6–1.2)
Chloride: 97 mEq/L — ABNORMAL LOW (ref 98–108)
GLUCOSE: 222 mg/dL — AB (ref 73–118)
Potassium: 3.7 mEq/L (ref 3.3–4.7)
SODIUM: 133 meq/L (ref 128–145)
Total Protein: 6.3 g/dL — ABNORMAL LOW (ref 6.4–8.1)

## 2015-11-16 LAB — CBC WITH DIFFERENTIAL (CANCER CENTER ONLY)
BASO#: 0.1 10*3/uL (ref 0.0–0.2)
BASO%: 1 % (ref 0.0–2.0)
EOS%: 0.6 % (ref 0.0–7.0)
Eosinophils Absolute: 0 10*3/uL (ref 0.0–0.5)
HEMATOCRIT: 30.3 % — AB (ref 38.7–49.9)
HEMOGLOBIN: 10.4 g/dL — AB (ref 13.0–17.1)
LYMPH#: 0.8 10*3/uL — AB (ref 0.9–3.3)
LYMPH%: 14.4 % (ref 14.0–48.0)
MCH: 31.2 pg (ref 28.0–33.4)
MCHC: 34.3 g/dL (ref 32.0–35.9)
MCV: 91 fL (ref 82–98)
MONO#: 0.8 10*3/uL (ref 0.1–0.9)
MONO%: 14.6 % — AB (ref 0.0–13.0)
NEUT%: 69.4 % (ref 40.0–80.0)
NEUTROS ABS: 3.7 10*3/uL (ref 1.5–6.5)
Platelets: 238 10*3/uL (ref 145–400)
RBC: 3.33 10*6/uL — ABNORMAL LOW (ref 4.20–5.70)
RDW: 15.7 % (ref 11.1–15.7)
WBC: 5.3 10*3/uL (ref 4.0–10.0)

## 2015-11-16 LAB — PROTIME-INR (CHCC SATELLITE)
INR: 2.3 (ref 2.0–3.5)
Protime: 27.6 Seconds — ABNORMAL HIGH (ref 10.6–13.4)

## 2015-11-16 LAB — LACTATE DEHYDROGENASE: LDH: 203 U/L (ref 125–245)

## 2015-11-16 MED ORDER — ACETAMINOPHEN 325 MG PO TABS
ORAL_TABLET | ORAL | Status: AC
  Filled 2015-11-16: qty 2

## 2015-11-16 MED ORDER — HEPARIN SOD (PORK) LOCK FLUSH 100 UNIT/ML IV SOLN
500.0000 [IU] | Freq: Once | INTRAVENOUS | Status: AC | PRN
  Administered 2015-11-16: 500 [IU]
  Filled 2015-11-16: qty 5

## 2015-11-16 MED ORDER — PALONOSETRON HCL INJECTION 0.25 MG/5ML
0.2500 mg | Freq: Once | INTRAVENOUS | Status: AC
  Administered 2015-11-16: 0.25 mg via INTRAVENOUS

## 2015-11-16 MED ORDER — VINCRISTINE SULFATE CHEMO INJECTION 1 MG/ML
1.0000 mg | Freq: Once | INTRAVENOUS | Status: AC
  Administered 2015-11-16: 1 mg via INTRAVENOUS
  Filled 2015-11-16: qty 1

## 2015-11-16 MED ORDER — SODIUM CHLORIDE 0.9% FLUSH
10.0000 mL | INTRAVENOUS | Status: DC | PRN
  Administered 2015-11-16: 10 mL
  Filled 2015-11-16: qty 10

## 2015-11-16 MED ORDER — DOXORUBICIN HCL CHEMO IV INJECTION 2 MG/ML
37.5000 mg/m2 | Freq: Once | INTRAVENOUS | Status: AC
  Administered 2015-11-16: 90 mg via INTRAVENOUS
  Filled 2015-11-16: qty 45

## 2015-11-16 MED ORDER — PEGFILGRASTIM 6 MG/0.6ML ~~LOC~~ PSKT
PREFILLED_SYRINGE | SUBCUTANEOUS | Status: AC
  Filled 2015-11-16: qty 0.6

## 2015-11-16 MED ORDER — DEXAMETHASONE SODIUM PHOSPHATE 10 MG/ML IJ SOLN
10.0000 mg | Freq: Once | INTRAMUSCULAR | Status: AC
  Administered 2015-11-16: 10 mg via INTRAVENOUS

## 2015-11-16 MED ORDER — DIPHENHYDRAMINE HCL 25 MG PO CAPS
50.0000 mg | ORAL_CAPSULE | Freq: Once | ORAL | Status: AC
  Administered 2015-11-16: 50 mg via ORAL

## 2015-11-16 MED ORDER — DIPHENHYDRAMINE HCL 25 MG PO CAPS
ORAL_CAPSULE | ORAL | Status: AC
  Filled 2015-11-16: qty 2

## 2015-11-16 MED ORDER — HYDROCODONE-HOMATROPINE 5-1.5 MG/5ML PO SYRP
5.0000 mL | ORAL_SOLUTION | Freq: Four times a day (QID) | ORAL | 0 refills | Status: DC | PRN
Start: 2015-11-16 — End: 2015-12-19

## 2015-11-16 MED ORDER — SODIUM CHLORIDE 0.9 % IV SOLN
562.5000 mg/m2 | Freq: Once | INTRAVENOUS | Status: AC
  Administered 2015-11-16: 1340 mg via INTRAVENOUS
  Filled 2015-11-16: qty 67

## 2015-11-16 MED ORDER — SODIUM CHLORIDE 0.9 % IV SOLN
Freq: Once | INTRAVENOUS | Status: AC
  Administered 2015-11-16: 09:00:00 via INTRAVENOUS

## 2015-11-16 MED ORDER — DEXAMETHASONE SODIUM PHOSPHATE 10 MG/ML IJ SOLN
INTRAMUSCULAR | Status: AC
  Filled 2015-11-16: qty 1

## 2015-11-16 MED ORDER — PALONOSETRON HCL INJECTION 0.25 MG/5ML
INTRAVENOUS | Status: AC
  Filled 2015-11-16: qty 5

## 2015-11-16 MED ORDER — PEGFILGRASTIM 6 MG/0.6ML ~~LOC~~ PSKT
6.0000 mg | PREFILLED_SYRINGE | Freq: Once | SUBCUTANEOUS | Status: AC
  Administered 2015-11-16: 6 mg via SUBCUTANEOUS

## 2015-11-16 MED ORDER — RITUXIMAB CHEMO INJECTION 500 MG/50ML
375.0000 mg/m2 | Freq: Once | INTRAVENOUS | Status: AC
Start: 2015-11-16 — End: 2015-11-16
  Administered 2015-11-16: 900 mg via INTRAVENOUS
  Filled 2015-11-16: qty 90

## 2015-11-16 MED ORDER — ACETAMINOPHEN 325 MG PO TABS
650.0000 mg | ORAL_TABLET | Freq: Once | ORAL | Status: AC
  Administered 2015-11-16: 650 mg via ORAL

## 2015-11-16 NOTE — Patient Instructions (Signed)
[facility] Discharge Instructions for Patients Receiving Chemotherapy  Today you received the following chemotherapy agents Rituxan , Cytoxan , Doxorubicin and vincristine.         To help prevent nausea and vomiting after your treatment, we encourage you to take your nausea medication   If you develop nausea and vomiting that is not controlled by your nausea medication, call the clinic. If it is after clinic hours your family physician or the after hours number for the clinic or go to the Emergency Department.   BELOW ARE SYMPTOMS THAT SHOULD BE REPORTED IMMEDIATELY:  *FEVER GREATER THAN 100.5 F  *CHILLS WITH OR WITHOUT FEVER  NAUSEA AND VOMITING THAT IS NOT CONTROLLED WITH YOUR NAUSEA MEDICATION  *UNUSUAL SHORTNESS OF BREATH  *UNUSUAL BRUISING OR BLEEDING  TENDERNESS IN MOUTH AND THROAT WITH OR WITHOUT PRESENCE OF ULCERS  *URINARY PROBLEMS  *BOWEL PROBLEMS  UNUSUAL RASH Items with * indicate a potential emergency and should be followed up as soon as possible.  One of the nurses will contact you 24 hours after your treatment. Please let the nurse know about any problems that you may have experienced. Feel free to call the clinic you have any questions or concerns. The clinic phone number is [phone]   I have been informed and understand all the instructions given to me. I know to contact the clinic, my physician, or go to the Emergency Department if any problems should occur. I do not have any questions at this time, but understand that I may call the clinic during office hours   should I have any questions or need assistance in obtaining follow up care.    __________________________________________  _____________  __________ Signature of Patient or Authorized Representative            Date                   Time    __________________________________________ Nurse's Signature   Rituximab injection What is this medicine? RITUXIMAB (ri TUX i mab)  is a monoclonal antibody. This medicine changes the way the body's immune system works. It is used commonly to treat non-Hodgkin's lymphoma and other conditions. In cancer cells, this drug targets a specific protein within cancer cells and stops the cancer cells from growing. It is also used to treat rhuematoid arthritis (RA). In RA, this medicine slow the inflammatory process and help reduce joint pain and swelling. This medicine is often used with other cancer or arthritis medications. This medicine may be used for other purposes; ask your health care provider or pharmacist if you have questions. What should I tell my health care provider before I take this medicine? They need to know if you have any of these conditions: -blood disorders -heart disease -history of hepatitis B -infection (especially a virus infection such as chickenpox, cold sores, or herpes) -irregular heartbeat -kidney disease -lung or breathing disease, like asthma -lupus -an unusual or allergic reaction to rituximab, mouse proteins, other medicines, foods, dyes, or preservatives -pregnant or trying to get pregnant -breast-feeding How should I use this medicine? This medicine is for infusion into a vein. It is administered in a hospital or clinic by a specially trained health care professional. A special MedGuide will be given to you by the pharmacist with each prescription and refill. Be sure to read this information carefully each time. Talk to your pediatrician regarding the use of this medicine in children. This medicine is not approved for use in  children. Overdosage: If you think you have taken too much of this medicine contact a poison control center or emergency room at once. NOTE: This medicine is only for you. Do not share this medicine with others. What if I miss a dose? It is important not to miss a dose. Call your doctor or health care professional if you are unable to keep an appointment. What may interact  with this medicine? -cisplatin -medicines for blood pressure -some other medicines for arthritis -vaccines This list may not describe all possible interactions. Give your health care provider a list of all the medicines, herbs, non-prescription drugs, or dietary supplements you use. Also tell them if you smoke, drink alcohol, or use illegal drugs. Some items may interact with your medicine. What should I watch for while using this medicine? Report any side effects that you notice during your treatment right away, such as changes in your breathing, fever, chills, dizziness or lightheadedness. These effects are more common with the first dose. Visit your prescriber or health care professional for checks on your progress. You will need to have regular blood work. Report any other side effects. The side effects of this medicine can continue after you finish your treatment. Continue your course of treatment even though you feel ill unless your doctor tells you to stop. Call your doctor or health care professional for advice if you get a fever, chills or sore throat, or other symptoms of a cold or flu. Do not treat yourself. This drug decreases your body's ability to fight infections. Try to avoid being around people who are sick. This medicine may increase your risk to bruise or bleed. Call your doctor or health care professional if you notice any unusual bleeding. Be careful brushing and flossing your teeth or using a toothpick because you may get an infection or bleed more easily. If you have any dental work done, tell your dentist you are receiving this medicine. Avoid taking products that contain aspirin, acetaminophen, ibuprofen, naproxen, or ketoprofen unless instructed by your doctor. These medicines may hide a fever. Do not become pregnant while taking this medicine. Women should inform their doctor if they wish to become pregnant or think they might be pregnant. There is a potential for serious side  effects to an unborn child. Talk to your health care professional or pharmacist for more information. Do not breast-feed an infant while taking this medicine. What side effects may I notice from receiving this medicine? Side effects that you should report to your doctor or health care professional as soon as possible: -allergic reactions like skin rash, itching or hives, swelling of the face, lips, or tongue -low blood counts - this medicine may decrease the number of white blood cells, red blood cells and platelets. You may be at increased risk for infections and bleeding. -signs of infection - fever or chills, cough, sore throat, pain or difficulty passing urine -signs of decreased platelets or bleeding - bruising, pinpoint red spots on the skin, black, tarry stools, blood in the urine -signs of decreased red blood cells - unusually weak or tired, fainting spells, lightheadedness -breathing problems -confused, not responsive -chest pain -fast, irregular heartbeat -feeling faint or lightheaded, falls -mouth sores -redness, blistering, peeling or loosening of the skin, including inside the mouth -stomach pain -swelling of the ankles, feet, or hands -trouble passing urine or change in the amount of urine Side effects that usually do not require medical attention (report to your doctor or other health care professional  if they continue or are bothersome): -anxiety -headache -loss of appetite -muscle aches -nausea -night sweats This list may not describe all possible side effects. Call your doctor for medical advice about side effects. You may report side effects to FDA at 1-800-FDA-1088. Where should I keep my medicine? This drug is given in a hospital or clinic and will not be stored at home.

## 2015-11-16 NOTE — Progress Notes (Signed)
Hematology and Oncology Follow Up Visit  Gary Yates [id number] 03/07/1933 82 y.o. 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 His second cycle of treatment was much better tolerated than the first cycle. He had no problems with mouth sores or mucositis. He did not have any Candida in his mouth.  We did go ahead and get a PET scan on him. The PET scan showed marked decrease in the activity in the left adrenal nodule. It now measures 1.3 x 1.5 cm. He has a SUV of 3. No other disease is noted elsewhere. He has some activity in his spine which is felt to be from his Neupogen.   His main problem has been this cough. I'm not sure was causing this. I wonder if he has little bit of reflux. I told him to take Pepcid 40 mg twice a day. His son,Gary Yates, understands this.  I did give him a chest x-ray. I also did an EKG. EKG showed right bundle branch block. I don't think this was anything new. He had normal sinus rhythm.  I did a chest x-ray which showed a possible cavitary lesion in the left lung. Of course, the radiologist said that we had a do a CT scan to see if this was the case. I find it interesting that on his PET scan analysis done 2 weeks ago, at there was no evidence or no mention of any lesion in his lung . I did give him a prescription for Hycodan to take 1 teaspoon at bedtime.  His blood sugars have been doing okay. His Coumadin level has been better monitored. His INR today was 2.3.  His appetite seems to be doing fairly well. He's had no nausea or vomiting.  Overall, I said that his performance status is ECOG 1-2.  Medications:  Current Outpatient Prescriptions:  .  ACCU-CHEK AVIVA PLUS test strip, TEST 3 TIMES A DAY, Disp: 100 each, Rfl: 3 .  ACCU-CHEK AVIVA PLUS test strip, TEST 3 TIMES A DAY, Disp: 300 each, Rfl:  5 .  acetaminophen (TYLENOL) 500 MG tablet, Take 500 mg by mouth every 6 (six) hours as needed for mild pain., Disp: , Rfl:  .  atorvastatin (LIPITOR) 40 MG tablet, TAKE 1 TABLET BY MOUTH DAILY, Disp: 90 tablet, Rfl: 1 .  cefdinir (OMNICEF) 300 MG capsule, Take 1 capsule (300 mg total) by mouth 2 (two) times daily., Disp: 30 capsule, Rfl: 5 .  clonazePAM (KLONOPIN) 1 MG tablet, TAKE 1 TABLET EVERY DAY AT BEDTIME AS NEEDED, Disp: 30 tablet, Rfl: 5 .  clopidogrel (PLAVIX) 75 MG tablet, TAKE 1 TABLET DAILY WITH BREAKFAST, Disp: 30 tablet, Rfl: 5 .  clopidogrel (PLAVIX) 75 MG tablet, TAKE 1 TABLET BY MOUTH DAILY, Disp: 30 tablet, Rfl: 8 .  fenofibrate micronized (LOFIBRA) 134 MG capsule, Take 1 capsule by mouth  daily before breakfast, Disp: 90 capsule, Rfl: 1 .  fluconazole (DIFLUCAN) 100 MG tablet, Take 1 tablet (100 mg total) by mouth daily., Disp: 30 tablet, Rfl: 2 .  gabapentin (NEURONTIN) 300 MG capsule, Take 2 capsules by mouth 3  times a day (Patient taking differently: one capsule twice daily), Disp: 540 capsule, Rfl: 1 .  HYDROcodone-acetaminophen (NORCO) 10-325 MG tablet, Take 1-2 tablets by mouth every 4 (four) hours as needed for moderate pain.  Maximum dose per 24 hours - 8 pills, Disp: 30 tablet, Rfl: 0 .  HYDROcodone-homatropine (HYCODAN) 5-1.5 MG/5ML syrup, Take 5 mLs by mouth every 6 (six) hours as needed for cough., Disp: 120 mL, Rfl: 0 .  Insulin Glargine (TOUJEO SOLOSTAR) 300 UNIT/ML SOPN, Inject 20 Units into the skin daily after breakfast. , Disp: , Rfl:  .  insulin lispro (HUMALOG) 100 UNIT/ML injection, Take 1 times daily with meals as per sliding scale., Disp: , Rfl:  .  ipratropium (ATROVENT HFA) 17 MCG/ACT inhaler, Inhale 2 puffs into the lungs every 4 (four) hours as needed for wheezing., Disp: 1 Inhaler, Rfl: 12 .  lidocaine-prilocaine (EMLA) cream, Apply to affected area once, Disp: 30 g, Rfl: 3 .  LORazepam (ATIVAN) 0.5 MG tablet, TAKE 1 TABLET EVERY 6 HOURS AS NEEDED  NASUSEA OR VOMITING, Disp: 30 tablet, Rfl: 0 .  metFORMIN (GLUCOPHAGE) 500 MG tablet, TAKE 1 TABLET (500 MG TOTAL) BY MOUTH 2 (TWO) TIMES DAILY WITH A MEAL., Disp: 60 tablet, Rfl: 5 .  metoprolol succinate (TOPROL-XL) 100 MG 24 hr tablet, TAKE 1 TABLET EVERY DAY IMMEDIATELY FOLLOWING A MEAL, Disp: 90 tablet, Rfl: 1 .  nitroGLYCERIN (NITROSTAT) 0.4 MG SL tablet, Place 1 tablet (0.4 mg total) under the tongue every 5 (five) minutes as needed. Chest pain, Disp: 25 tablet, Rfl: 6 .  ondansetron (ZOFRAN) 8 MG tablet, Take 1 tablet (8 mg total) by mouth 2 (two) times daily as needed for refractory nausea / vomiting. Start on day 3 after cyclophosphamide chemotherapy., Disp: 30 tablet, Rfl: 1 .  oxybutynin (DITROPAN XL) 15 MG 24 hr tablet, , Disp: , Rfl:  .  pantoprazole (PROTONIX) 40 MG tablet, TAKE 1 TABLET EVERY DAY, Disp: 90 tablet, Rfl: 2 .  pramipexole (MIRAPEX) 1.5 MG tablet, MAY TAKE 1 TO 1 &1/2 TABLETS BY MOUTH AT BEDTIME, Disp: 60 tablet, Rfl: 5 .  predniSONE (DELTASONE) 20 MG tablet, Take 3 tablets (60 mg total) by mouth daily. Take on days 1-5 of chemotherapy., Disp: 15 tablet, Rfl: 6 .  prochlorperazine (COMPAZINE) 10 MG tablet, Take 1 tablet (10 mg total) by mouth every 6 (six) hours as needed (Nausea or vomiting)., Disp: 30 tablet, Rfl: 6 .  tamsulosin (FLOMAX) 0.4 MG CAPS capsule, Take 0.4 mg by mouth daily as needed. For fluid, Disp: , Rfl:  .  Trospium Chloride 60 MG CP24, , Disp: , Rfl:  .  vitamin B-12 (CYANOCOBALAMIN) 1000 MCG tablet, Take 1 tablet (1,000 mcg total) by mouth daily., Disp: 30 tablet, Rfl: 0 .  warfarin (COUMADIN) 5 MG tablet, Take 1 tablet (5 mg total) by mouth daily. Except on Mon, Wed, Fri take 7.5 mg, Disp: 40 tablet, Rfl: 1 No current facility-administered medications for this visit.   Facility-Administered Medications Ordered in Other Visits:  .  sodium chloride flush (NS) 0.9 % injection 10 mL, 10 mL, Intracatheter, PRN, Volanda Napoleon, MD, 10 mL at 11/16/15  1626 .  testosterone cypionate (DEPOTESTOTERONE CYPIONATE) injection 200 mg, 200 mg, Intramuscular, Q28 days, [name], MD, 200 mg at 12/26/11 0850 .  testosterone cypionate (DEPOTESTOTERONE CYPIONATE) injection 200 mg, 200 mg, Intramuscular, Q28 days, [name], MD, 200 mg at 01/23/12 0908 .  testosterone cypionate (DEPOTESTOTERONE CYPIONATE) injection 200 mg, 200 mg, Intramuscular, Q28 days, [name], MD, 200 mg at 02/25/12 0848 .  testosterone cypionate (DEPOTESTOTERONE CYPIONATE) injection 200 mg, 200 mg, Intramuscular, Q28 days, [name], MD, 200 mg at 06/03/12 1437  Allergies:  Allergies  Allergen Reactions  . Ace Inhibitors Other (See Comments)    cough  . Codeine Nausea Only and Rash       . Penicillins Rash    Childhood allergy Has patient had a PCN reaction causing immediate rash, facial/tongue/throat swelling, SOB or lightheadedness with hypotension: Yes Has patient had a PCN reaction causing severe rash involving mucus membranes or skin necrosis: Yes Has patient had a PCN reaction that required hospitalization No Has patient had a PCN reaction occurring within the last 10 years: No If all of the above answers are "NO", then may proceed with Cephalosporin use.     Past Medical History, Surgical history, Social history, and Family History were reviewed and updated.  Review of Systems:  As above  Physical Exam:  weight is 236 lb 12.8 oz (107.4 kg). His oral temperature is 97.9 F (36.6 C). His blood pressure is 138/75 and his pulse is 92. His respiration is 18.   Wt Readings from Last 3 Encounters:  11/16/15 236 lb 12.8 oz (107.4 kg)  10/26/15 237 lb (107.5 kg)  10/16/15 236 lb 12.8 oz (107.4 kg)      Elderly white male. He started to lose his hair. Head and neck exam shows no ocular or oral lesions. There are no palpable cervical or supraclavicular lymph nodes. He has no oral thrush. There is no oral mucositis. There is no palpable  adenopathy in the neck. Lungs are clear to percussion and auscultation bilaterally. Cardiac exam regular rate and rhythm consistent with atrial fibrillation. He has no murmurs, rubs or bruits. Abdomen is soft. He has decent bowel sounds. There is no fluid wave. There is no guarding or rebound tenderness. He has no palpable hepatosplenomegaly. He has a healed right inguinal orchiectomy scar. Extremities shows chronic 2+ edema in his legs. Neurological exam shows no focal neurological deficits. Skin exam shows some brawny edema in his lower legs.  Lab Results  Component Value Date   WBC 5.3 11/16/2015   HGB 10.4 (L) 11/16/2015   HCT 30.3 (L) 11/16/2015   MCV 91 11/16/2015   PLT 238 11/16/2015     Chemistry      Component Value Date/Time   NA 133 11/16/2015 0741   K 3.7 11/16/2015 0741   CL 97 (L) 11/16/2015 0741   CO2 27 11/16/2015 0741   BUN 13 11/16/2015 0741   CREATININE 1.0 11/16/2015 0741      Component Value Date/Time   CALCIUM 8.7 11/16/2015 0741   ALKPHOS 71 11/16/2015 0741   AST 22 11/16/2015 0741   ALT 17 11/16/2015 0741   BILITOT 0.60 11/16/2015 0741         Impression and Plan: Mr. Blanchfield is A 82 year old white male. He has testicular lymphoma. He is on systemic chemotherapy. He has a marginal performance status. As such, we can really cannot be all that aggressive with him.  I don't think he would be a great candidate for intrathecal chemotherapy. He would be a candidate for radiation therapy to the scrotal region.  We will go ahead with his treatment.   I think we might be able to get away with 4 cycles of treatment followed by scrotal radiation.  We will see about doing a CT scan. I can do it without contrast.  Hopefully, the cough will be better for him. I will plan to get him back to see [place] in 3 weeks.  I spent about 40 minutes with him today.  Volanda Napoleon, MD  11/9/20174:56 PM

## 2015-11-17 ENCOUNTER — Ambulatory Visit: Payer: Medicare Other

## 2015-11-20 ENCOUNTER — Encounter (HOSPITAL_COMMUNITY): Payer: Self-pay

## 2015-11-20 ENCOUNTER — Ambulatory Visit (HOSPITAL_COMMUNITY)
Admission: RE | Admit: 2015-11-20 | Discharge: 2015-11-20 | Disposition: A | Discharge status: Home / Self Care | Payer: Medicare Other | Source: Ambulatory Visit | Attending: Hematology & Oncology | Admitting: Hematology & Oncology

## 2015-11-20 DIAGNOSIS — R918 Other nonspecific abnormal finding of lung field: Secondary | ICD-10-CM | POA: Insufficient documentation

## 2015-11-20 DIAGNOSIS — C8589 Other specified types of non-Hodgkin lymphoma, extranodal and solid organ sites: Secondary | ICD-10-CM

## 2015-11-20 DIAGNOSIS — C7972 Secondary malignant neoplasm of left adrenal gland: Secondary | ICD-10-CM | POA: Insufficient documentation

## 2015-11-20 DIAGNOSIS — K7460 Unspecified cirrhosis of liver: Secondary | ICD-10-CM | POA: Diagnosis not present

## 2015-11-20 DIAGNOSIS — I700 Atherosclerosis of aorta: Secondary | ICD-10-CM | POA: Diagnosis not present

## 2015-11-20 DIAGNOSIS — Z9049 Acquired absence of other specified parts of digestive tract: Secondary | ICD-10-CM | POA: Insufficient documentation

## 2015-11-20 DIAGNOSIS — J42 Unspecified chronic bronchitis: Secondary | ICD-10-CM

## 2015-11-20 DIAGNOSIS — I2510 Atherosclerotic heart disease of native coronary artery without angina pectoris: Secondary | ICD-10-CM | POA: Insufficient documentation

## 2015-11-23 ENCOUNTER — Ambulatory Visit (HOSPITAL_BASED_OUTPATIENT_CLINIC_OR_DEPARTMENT_OTHER): Payer: Medicare Other

## 2015-11-23 ENCOUNTER — Telehealth: Payer: Self-pay | Admitting: *Deleted

## 2015-11-23 ENCOUNTER — Other Ambulatory Visit: Payer: Self-pay | Admitting: Family

## 2015-11-23 ENCOUNTER — Other Ambulatory Visit: Payer: Self-pay | Admitting: Hematology & Oncology

## 2015-11-23 VITALS — BP 136/76 | HR 94 | Temp 97.7°F | Resp 18

## 2015-11-23 DIAGNOSIS — C8339 Diffuse large B-cell lymphoma, extranodal and solid organ sites: Secondary | ICD-10-CM | POA: Diagnosis not present

## 2015-11-23 DIAGNOSIS — Z7901 Long term (current) use of anticoagulants: Secondary | ICD-10-CM

## 2015-11-23 DIAGNOSIS — J189 Pneumonia, unspecified organism: Secondary | ICD-10-CM

## 2015-11-23 DIAGNOSIS — B370 Candidal stomatitis: Secondary | ICD-10-CM

## 2015-11-23 DIAGNOSIS — E860 Dehydration: Secondary | ICD-10-CM

## 2015-11-23 DIAGNOSIS — J181 Lobar pneumonia, unspecified organism: Principal | ICD-10-CM

## 2015-11-23 DIAGNOSIS — D500 Iron deficiency anemia secondary to blood loss (chronic): Secondary | ICD-10-CM

## 2015-11-23 DIAGNOSIS — B3781 Candidal esophagitis: Secondary | ICD-10-CM

## 2015-11-23 DIAGNOSIS — R600 Localized edema: Secondary | ICD-10-CM

## 2015-11-23 DIAGNOSIS — J42 Unspecified chronic bronchitis: Secondary | ICD-10-CM | POA: Diagnosis not present

## 2015-11-23 DIAGNOSIS — C8589 Other specified types of non-Hodgkin lymphoma, extranodal and solid organ sites: Secondary | ICD-10-CM

## 2015-11-23 LAB — COMPREHENSIVE METABOLIC PANEL
ALBUMIN: 2.9 g/dL — AB (ref 3.5–5.0)
ALK PHOS: 106 U/L (ref 40–150)
ALT: 10 U/L (ref 0–55)
AST: 18 U/L (ref 5–34)
Anion Gap: 10 mEq/L (ref 3–11)
BUN: 18.3 mg/dL (ref 7.0–26.0)
CO2: 23 mEq/L (ref 22–29)
Calcium: 9.1 mg/dL (ref 8.4–10.4)
Chloride: 105 mEq/L (ref 98–109)
Creatinine: 0.9 mg/dL (ref 0.7–1.3)
EGFR: 81 mL/min/{1.73_m2} — AB (ref >90)
GLUCOSE: 241 mg/dL — AB (ref 70–140)
POTASSIUM: 4 meq/L (ref 3.5–5.1)
SODIUM: 138 meq/L (ref 136–145)
Total Bilirubin: 0.5 mg/dL (ref 0.20–1.20)
Total Protein: 5.8 g/dL — ABNORMAL LOW (ref 6.4–8.3)

## 2015-11-23 LAB — PROTIME-INR (CHCC SATELLITE)
INR: 1.9 — ABNORMAL LOW (ref 2.0–3.5)
PROTIME: 22.8 s — AB (ref 10.6–13.4)

## 2015-11-23 LAB — CBC WITH DIFFERENTIAL (CANCER CENTER ONLY)
BASO#: 0 10*3/uL (ref 0.0–0.2)
BASO%: 0.5 % (ref 0.0–2.0)
EOS ABS: 0 10*3/uL (ref 0.0–0.5)
EOS%: 0.6 % (ref 0.0–7.0)
HCT: 29.1 % — ABNORMAL LOW (ref 38.7–49.9)
HGB: 9.7 g/dL — ABNORMAL LOW (ref 13.0–17.1)
LYMPH#: 0.7 10*3/uL — ABNORMAL LOW (ref 0.9–3.3)
LYMPH%: 10.7 % — AB (ref 14.0–48.0)
MCH: 30.6 pg (ref 28.0–33.4)
MCHC: 33.3 g/dL (ref 32.0–35.9)
MCV: 92 fL (ref 82–98)
MONO#: 0.2 10*3/uL (ref 0.1–0.9)
MONO%: 3.8 % (ref 0.0–13.0)
NEUT#: 5.3 10*3/uL (ref 1.5–6.5)
NEUT%: 84.4 % — ABNORMAL HIGH (ref 40.0–80.0)
PLATELETS: 197 10*3/uL (ref 145–400)
RBC: 3.17 10*6/uL — AB (ref 4.20–5.70)
RDW: 15.1 % (ref 11.1–15.7)
WBC: 6.3 10*3/uL (ref 4.0–10.0)

## 2015-11-23 MED ORDER — LEVOFLOXACIN IN D5W 750 MG/150ML IV SOLN
750.0000 mg | Freq: Once | INTRAVENOUS | Status: AC
  Administered 2015-11-23: 750 mg via INTRAVENOUS
  Filled 2015-11-23: qty 150

## 2015-11-23 MED ORDER — SODIUM CHLORIDE 0.9% FLUSH
10.0000 mL | INTRAVENOUS | Status: DC | PRN
  Filled 2015-11-23: qty 10

## 2015-11-23 MED ORDER — FUROSEMIDE 40 MG PO TABS: 40.0000 mg | ORAL_TABLET | Freq: Every day | ORAL | 1 refills | Status: DC

## 2015-11-23 MED ORDER — HEPARIN SOD (PORK) LOCK FLUSH 100 UNIT/ML IV SOLN
500.0000 [IU] | Freq: Once | INTRAVENOUS | Status: AC
  Administered 2015-11-23: 500 [IU] via INTRAVENOUS
  Filled 2015-11-23: qty 5

## 2015-11-23 NOTE — Telephone Encounter (Signed)
Patient called and wants to cancel his appointment today for labs because he feels poorly. When probed he states he doesn't feel good and aches all over. He is encouraged to come in for labs and nurse assessment to make sure that his symptoms are nothing serious. Patient refuses. Spoke to Dr Marin Olp and he requested that I call the patient's daughter.  Spoke with Lattie Haw and she will ensure that patient comes in for today's appointment.

## 2015-11-23 NOTE — Patient Instructions (Signed)
Levofloxacin injection What is this medicine? LEVOFLOXACIN (Liberman voe FLOX a sin) is a quinolone antibiotic. It is used to treat certain kinds of bacterial infections. It will not work for colds, flu, or other viral infections. This medicine may be used for other purposes; ask your health care provider or pharmacist if you have questions. COMMON BRAND NAME(S): Levaquin What should I tell my health care provider before I take this medicine? They need to know if you have any of these conditions: -bone problems -diabetes -history of low levels of potassium in the blood -irregular heartbeat -joint problems -kidney disease -liver disease -myasthenia gravis -seizures -tendon problems -tingling of the fingers or toes, or other nerve disorder -an unusual or allergic reaction to levofloxacin, other quinolone antibiotics, foods, dyes, or preservatives -pregnant or trying to get pregnant -breast-feeding How should I use this medicine? This medicine is for infusion into a vein. It is usually given by a health care professional in a hospital or clinic setting. If you get this medicine at home, you will be taught how to prepare and give this medicine. Use exactly as directed. Take your medicine at regular intervals. Do not take your medicine more often than directed. It is important that you put your used needles and syringes in a special sharps container. Do not put them in a trash can. If you do not have a sharps container, call your pharmacist or healthcare provider to get one. A special MedGuide will be given to you by the pharmacist with each prescription and refill. Be sure to read this information carefully each time. Talk to your pediatrician regarding the use of this medicine in children. While this drug may be prescribed for children as young as 6 months for selected conditions, precautions do apply. Overdosage: If you think you have taken too much of this medicine contact a poison control  center or emergency room at once. NOTE: This medicine is only for you. Do not share this medicine with others. What if I miss a dose? If you miss a dose, use it as soon as you can. If it is almost time for your next dose, use only that dose. Do not use double or extra doses. What may interact with this medicine? Do not take this medicine with any of the following medications: -bepridil -certain medicines for depression, anxiety, or psychotic disturbances like pimozide, thioridazine, and ziprasidone -certain medicines for irregular heart beat like dofetilide and dronedarone -cisapride -halofantrine This medicine may also interact with the following medications: -birth control pills -certain medicines for diabetes, like glipizide, glyburide, or insulin -NSAIDS, medicines for pain and inflammation, like ibuprofen or naproxen -steroid medicines like prednisone or cortisone -theophylline -warfarin This list may not describe all possible interactions. Give your health care provider a list of all the medicines, herbs, non-prescription drugs, or dietary supplements you use. Also tell them if you smoke, drink alcohol, or use illegal drugs. Some items may interact with your medicine. What should I watch for while using this medicine? Tell your doctor or healthcare professional if your symptoms do not start to get better or if they get worse. Do not treat diarrhea with over the counter products. Contact your doctor if you have diarrhea that lasts more than 2 days or if it is severe and watery. Check with your doctor or health care professional if you get an attack of severe diarrhea, nausea and vomiting, or if you sweat a lot. The loss of too much body fluid can make it dangerous  for you to take this medicine. You may get drowsy or dizzy. Do not drive, use machinery, or do anything that needs mental alertness until you know how this medicine affects you. Do not sit or stand up quickly, especially if you  are an older patient. This reduces the risk of dizzy or fainting spells. This medicine can make you more sensitive to the sun. Keep out of the sun. If you cannot avoid being in the sun, wear protective clothing and use a sunscreen. Do not use sun lamps or tanning beds/booths. Contact your doctor if you get a sunburn. If you are a diabetic monitor your blood glucose carefully. If you get an unusual reading stop taking this medicine and call your doctor right away. What side effects may I notice from receiving this medicine? Side effects that you should report to your doctor or health care professional as soon as possible: -allergic reactions like skin rash or hives, swelling of the face, lips, or tongue -anxious -breathing problems -confusion -depressed mood -diarrhea -dizziness -fast, irregular heartbeat -hallucination, loss of contact with reality -joint, muscle, or tendon pain or swelling -muscle weakness -pain, tingling, numbness in the hands or feet -seizures -signs and symptoms of high blood sugar such as dizziness; dry mouth; dry skin; fruity breath; nausea; stomach pain; increased hunger or thirst; increased urination -signs and symptoms of liver injury like dark yellow or brown urine; general ill feeling or flu-like symptoms; light-colored stools; loss of appetite; nausea; right upper belly pain; unusually weak or tired; yellowing of the eyes or skin -signs and symptoms of low blood sugar such as feeling anxious; confusion; dizziness; increased hunger; unusually weak or tired; sweating; shakiness; cold; irritable; headache; blurred vision; fast heartbeat; loss of consciousness -suicidal thoughts or other mood changes -sunburn -unusually weak or tired Side effects that usually do not require medical attention (report to your doctor or health care professional if they continue or are bothersome): -constipation -dry mouth -headache -nausea, vomiting -pain, irritation at the site of  injection -trouble sleeping This list may not describe all possible side effects. Call your doctor for medical advice about side effects. You may report side effects to FDA at 1-800-FDA-1088. Where should I keep my medicine? Keep out of the reach of children. If you are using this medicine at home, you will be instructed on how to store this medicine. Throw away any unused medicine after the expiration date on the label. NOTE: This sheet is a summary. It may not cover all possible information. If you have questions about this medicine, talk to your doctor, pharmacist, or health care provider.  2017 Elsevier/Gold Standard (2015-07-04 12:36:58)

## 2015-11-24 ENCOUNTER — Other Ambulatory Visit (HOSPITAL_BASED_OUTPATIENT_CLINIC_OR_DEPARTMENT_OTHER): Payer: Medicare Other

## 2015-11-24 ENCOUNTER — Other Ambulatory Visit: Payer: Self-pay | Admitting: Family Medicine

## 2015-11-24 ENCOUNTER — Telehealth: Payer: Self-pay | Admitting: Pharmacist

## 2015-11-24 ENCOUNTER — Ambulatory Visit (HOSPITAL_BASED_OUTPATIENT_CLINIC_OR_DEPARTMENT_OTHER): Payer: Medicare Other

## 2015-11-24 DIAGNOSIS — B370 Candidal stomatitis: Secondary | ICD-10-CM

## 2015-11-24 DIAGNOSIS — C8339 Diffuse large B-cell lymphoma, extranodal and solid organ sites: Secondary | ICD-10-CM

## 2015-11-24 DIAGNOSIS — J42 Unspecified chronic bronchitis: Secondary | ICD-10-CM

## 2015-11-24 DIAGNOSIS — C8589 Other specified types of non-Hodgkin lymphoma, extranodal and solid organ sites: Secondary | ICD-10-CM

## 2015-11-24 DIAGNOSIS — J189 Pneumonia, unspecified organism: Secondary | ICD-10-CM

## 2015-11-24 DIAGNOSIS — B3781 Candidal esophagitis: Secondary | ICD-10-CM

## 2015-11-24 DIAGNOSIS — D500 Iron deficiency anemia secondary to blood loss (chronic): Secondary | ICD-10-CM

## 2015-11-24 DIAGNOSIS — Z7901 Long term (current) use of anticoagulants: Secondary | ICD-10-CM

## 2015-11-24 DIAGNOSIS — E860 Dehydration: Secondary | ICD-10-CM

## 2015-11-24 LAB — CBC WITH DIFFERENTIAL (CANCER CENTER ONLY)
BASO#: 0 10e3/uL (ref 0.0–0.2)
BASO%: 0.3 % (ref 0.0–2.0)
EOS%: 0.8 % (ref 0.0–7.0)
Eosinophils Absolute: 0.1 10e3/uL (ref 0.0–0.5)
HCT: 29.9 % — ABNORMAL LOW (ref 38.7–49.9)
HGB: 10 g/dL — ABNORMAL LOW (ref 13.0–17.1)
LYMPH#: 0.7 10e3/uL — ABNORMAL LOW (ref 0.9–3.3)
LYMPH%: 8.7 % — ABNORMAL LOW (ref 14.0–48.0)
MCH: 30.8 pg (ref 28.0–33.4)
MCHC: 33.4 g/dL (ref 32.0–35.9)
MCV: 92 fL (ref 82–98)
MONO#: 0.4 10e3/uL (ref 0.1–0.9)
MONO%: 4.8 % (ref 0.0–13.0)
NEUT#: 6.4 10e3/uL (ref 1.5–6.5)
NEUT%: 85.4 % — ABNORMAL HIGH (ref 40.0–80.0)
Platelets: 192 10e3/uL (ref 145–400)
RBC: 3.25 10e6/uL — ABNORMAL LOW (ref 4.20–5.70)
RDW: 15 % (ref 11.1–15.7)
WBC: 7.5 10e3/uL (ref 4.0–10.0)

## 2015-11-24 LAB — CMP (CANCER CENTER ONLY)
ALT(SGPT): 16 U/L (ref 10–47)
AST: 21 U/L (ref 11–38)
Albumin: 3.2 g/dL — ABNORMAL LOW (ref 3.3–5.5)
Alkaline Phosphatase: 91 U/L — ABNORMAL HIGH (ref 26–84)
BUN, Bld: 12 mg/dL (ref 7–22)
CO2: 27 meq/L (ref 18–33)
Calcium: 9.6 mg/dL (ref 8.0–10.3)
Chloride: 99 meq/L (ref 98–108)
Creat: 1 mg/dL (ref 0.6–1.2)
Glucose, Bld: 247 mg/dL — ABNORMAL HIGH (ref 73–118)
Potassium: 3.8 meq/L (ref 3.3–4.7)
Sodium: 135 meq/L (ref 128–145)
Total Bilirubin: 0.5 mg/dL (ref 0.20–1.60)
Total Protein: 6.1 g/dL — ABNORMAL LOW (ref 6.4–8.1)

## 2015-11-24 LAB — IRON AND TIBC
%SAT: 26 % (ref 20–55)
IRON: 64 ug/dL (ref 42–163)
TIBC: 248 ug/dL (ref 202–409)
UIBC: 184 ug/dL (ref 117–376)

## 2015-11-24 LAB — FERRITIN: Ferritin: 390 ng/mL — ABNORMAL HIGH (ref 22–316)

## 2015-11-24 MED ORDER — LEVOFLOXACIN IN D5W 750 MG/150ML IV SOLN
750.0000 mg | Freq: Once | INTRAVENOUS | Status: AC
  Administered 2015-11-24: 750 mg via INTRAVENOUS
  Filled 2015-11-24: qty 150

## 2015-11-24 MED ORDER — SODIUM CHLORIDE 0.9% FLUSH
10.0000 mL | INTRAVENOUS | Status: DC | PRN
  Administered 2015-11-24: 10 mL via INTRAVENOUS
  Filled 2015-11-24: qty 10

## 2015-11-24 MED ORDER — HEPARIN SOD (PORK) LOCK FLUSH 100 UNIT/ML IV SOLN
500.0000 [IU] | Freq: Once | INTRAVENOUS | Status: AC
  Administered 2015-11-24: 500 [IU] via INTRAVENOUS
  Filled 2015-11-24: qty 5

## 2015-11-24 NOTE — Patient Instructions (Signed)
Dehydration, Adult Dehydration is when there is not enough fluid or water in your body. This happens when you lose more fluids than you take in. Dehydration can range from mild to very bad. It should be treated right away to keep it from getting very bad. Symptoms of mild dehydration may include:   Thirst.  Dry lips.  Slightly dry mouth.  Dry, warm skin.  Dizziness. Symptoms of moderate dehydration may include:   Very dry mouth.  Muscle cramps.  Dark pee (urine). Pee may be the color of tea.  Your body making less pee.  Your eyes making fewer tears.  Heartbeat that is uneven or faster than normal (palpitations).  Headache.  Light-headedness, especially when you stand up from sitting.  Fainting (syncope). Symptoms of very bad dehydration may include:   Changes in skin, such as:  Cold and clammy skin.  Blotchy (mottled) or pale skin.  Skin that does not quickly return to normal after being lightly pinched and let go (poor skin turgor).  Changes in body fluids, such as:  Feeling very thirsty.  Your eyes making fewer tears.  Not sweating when body temperature is high, such as in hot weather.  Your body making very little pee.  Changes in vital signs, such as:  Weak pulse.  Pulse that is more than 100 beats a minute when you are sitting still.  Fast breathing.  Low blood pressure.  Other changes, such as:  Sunken eyes.  Cold hands and feet.  Confusion.  Lack of energy (lethargy).  Trouble waking up from sleep.  Short-term weight loss.  Unconsciousness. Follow these instructions at home:  If told by your doctor, drink an ORS:  Make an ORS by using instructions on the package.  Start by drinking small amounts, about  cup (120 mL) every 5-10 minutes.  Slowly drink more until you have had the amount that your doctor said to have.  Drink enough clear fluid to keep your pee clear or pale yellow. If you were told to drink an ORS, finish the  ORS first, then start slowly drinking clear fluids. Drink fluids such as:  Water. Do not drink only water by itself. Doing that can make the salt (sodium) level in your body get too low (hyponatremia).  Ice chips.  Fruit juice that you have added water to (diluted).  Low-calorie sports drinks.  Avoid:  Alcohol.  Drinks that have a lot of sugar. These include high-calorie sports drinks, fruit juice that does not have water added, and soda.  Caffeine.  Foods that are greasy or have a lot of fat or sugar.  Take over-the-counter and prescription medicines only as told by your doctor.  Do not take salt tablets. Doing that can make the salt level in your body get too high (hypernatremia).  Eat foods that have minerals (electrolytes). Examples include bananas, oranges, potatoes, tomatoes, and spinach.  Keep all follow-up visits as told by your doctor. This is important. Contact a doctor if:  You have belly (abdominal) pain that:  Gets worse.  Stays in one area (localizes).  You have a rash.  You have a stiff neck.  You get angry or annoyed more easily than normal (irritability).  You are more sleepy than normal.  You have a harder time waking up than normal.  You feel:  Weak.  Dizzy.  Very thirsty.  You have peed (urinated) only a small amount of very dark pee during 6-8 hours. Get help right away if:  You   have symptoms of very bad dehydration.  You cannot drink fluids without throwing up (vomiting).  Your symptoms get worse with treatment.  You have a fever.  You have a very bad headache.  You are throwing up or having watery poop (diarrhea) and it:  Gets worse.  Does not go away.  You have blood or something green (bile) in your throw-up.  You have blood in your poop (stool). This may cause poop to look black and tarry.  You have not peed in 6-8 hours.  You pass out (faint).  Your heart rate when you are sitting still is more than 100 beats a  minute.  You have trouble breathing. This information is not intended to replace advice given to you by your health care provider. Make sure you discuss any questions you have with your health care provider. Document Released: 10/20/2008 Document Revised: 07/14/2015 Document Reviewed: 02/17/2015 Elsevier Interactive Patient Education  2017 [employer]  

## 2015-11-24 NOTE — Telephone Encounter (Signed)
Gary Yates is receiving a second dose of levofloxacin 750 mg IV today.  He takes warfarin at home.  Levofloxacin and warfarin have a moderate drug interaction that can increase the INR.   On 11/16 Mr. Coven INR was 1.9; he was just below the therapeutic range.   I spoke with Mr. And Mrs. Corprew in the infusion area.  They are aware that levofloxacin may increase his INR.  The will watch closely for any signs of bleeding and will call the clinic or on call physician if he should experience any problems. Dr. Marin Olp is in agreement with this plan.

## 2015-11-29 ENCOUNTER — Other Ambulatory Visit: Payer: Medicare Other

## 2015-11-29 ENCOUNTER — Other Ambulatory Visit (HOSPITAL_BASED_OUTPATIENT_CLINIC_OR_DEPARTMENT_OTHER): Payer: Medicare Other

## 2015-11-29 DIAGNOSIS — I4510 Unspecified right bundle-branch block: Secondary | ICD-10-CM | POA: Diagnosis not present

## 2015-11-29 DIAGNOSIS — C8589 Other specified types of non-Hodgkin lymphoma, extranodal and solid organ sites: Secondary | ICD-10-CM

## 2015-11-29 DIAGNOSIS — C8339 Diffuse large B-cell lymphoma, extranodal and solid organ sites: Secondary | ICD-10-CM | POA: Diagnosis not present

## 2015-11-29 DIAGNOSIS — J42 Unspecified chronic bronchitis: Secondary | ICD-10-CM

## 2015-11-29 DIAGNOSIS — Z7901 Long term (current) use of anticoagulants: Secondary | ICD-10-CM

## 2015-11-29 DIAGNOSIS — I2510 Atherosclerotic heart disease of native coronary artery without angina pectoris: Secondary | ICD-10-CM

## 2015-11-29 LAB — PROTIME-INR (CHCC SATELLITE)
INR: 1.5 — ABNORMAL LOW (ref 2.0–3.5)
PROTIME: 18 s — AB (ref 10.6–13.4)

## 2015-11-29 LAB — CBC WITH DIFFERENTIAL (CANCER CENTER ONLY)
BASO#: 0.1 10*3/uL (ref 0.0–0.2)
BASO%: 1.5 % (ref 0.0–2.0)
EOS%: 0.4 % (ref 0.0–7.0)
Eosinophils Absolute: 0 10*3/uL (ref 0.0–0.5)
HCT: 33.2 % — ABNORMAL LOW (ref 38.7–49.9)
HGB: 11 g/dL — ABNORMAL LOW (ref 13.0–17.1)
LYMPH#: 0.9 10*3/uL (ref 0.9–3.3)
LYMPH%: 15.7 % (ref 14.0–48.0)
MCH: 30.5 pg (ref 28.0–33.4)
MCHC: 33.1 g/dL (ref 32.0–35.9)
MCV: 92 fL (ref 82–98)
MONO#: 0.6 10*3/uL (ref 0.1–0.9)
MONO%: 10.4 % (ref 0.0–13.0)
NEUT%: 72 % (ref 40.0–80.0)
NEUTROS ABS: 3.9 10*3/uL (ref 1.5–6.5)
Platelets: 181 10*3/uL (ref 145–400)
RBC: 3.61 10*6/uL — ABNORMAL LOW (ref 4.20–5.70)
RDW: 16.2 % — ABNORMAL HIGH (ref 11.1–15.7)
WBC: 5.4 10*3/uL (ref 4.0–10.0)

## 2015-11-29 LAB — COMPREHENSIVE METABOLIC PANEL
ALBUMIN: 3.3 g/dL — AB (ref 3.5–5.0)
ALK PHOS: 84 U/L (ref 40–150)
ALT: 10 U/L (ref 0–55)
ANION GAP: 10 meq/L (ref 3–11)
AST: 21 U/L (ref 5–34)
BUN: 17.3 mg/dL (ref 7.0–26.0)
CALCIUM: 9.5 mg/dL (ref 8.4–10.4)
CO2: 25 mEq/L (ref 22–29)
Chloride: 102 mEq/L (ref 98–109)
Creatinine: 1.2 mg/dL (ref 0.7–1.3)
EGFR: 59 mL/min/{1.73_m2} — AB (ref >90)
Glucose: 191 mg/dl — ABNORMAL HIGH (ref 70–140)
POTASSIUM: 3.6 meq/L (ref 3.5–5.1)
Sodium: 137 mEq/L (ref 136–145)
Total Bilirubin: 0.41 mg/dL (ref 0.20–1.20)
Total Protein: 6.6 g/dL (ref 6.4–8.3)

## 2015-11-29 LAB — LACTATE DEHYDROGENASE: LDH: 199 U/L (ref 125–245)

## 2015-12-07 ENCOUNTER — Ambulatory Visit (HOSPITAL_BASED_OUTPATIENT_CLINIC_OR_DEPARTMENT_OTHER): Payer: Medicare Other

## 2015-12-07 ENCOUNTER — Ambulatory Visit (HOSPITAL_BASED_OUTPATIENT_CLINIC_OR_DEPARTMENT_OTHER): Payer: Medicare Other | Admitting: Hematology & Oncology

## 2015-12-07 ENCOUNTER — Other Ambulatory Visit (HOSPITAL_BASED_OUTPATIENT_CLINIC_OR_DEPARTMENT_OTHER): Payer: Medicare Other

## 2015-12-07 VITALS — BP 113/60 | HR 93 | Temp 97.9°F | Resp 18 | Wt 235.0 lb

## 2015-12-07 DIAGNOSIS — B3781 Candidal esophagitis: Secondary | ICD-10-CM

## 2015-12-07 DIAGNOSIS — Z5112 Encounter for antineoplastic immunotherapy: Secondary | ICD-10-CM

## 2015-12-07 DIAGNOSIS — C8589 Other specified types of non-Hodgkin lymphoma, extranodal and solid organ sites: Secondary | ICD-10-CM

## 2015-12-07 DIAGNOSIS — R600 Localized edema: Secondary | ICD-10-CM | POA: Diagnosis not present

## 2015-12-07 DIAGNOSIS — C8339 Diffuse large B-cell lymphoma, extranodal and solid organ sites: Secondary | ICD-10-CM

## 2015-12-07 DIAGNOSIS — E860 Dehydration: Secondary | ICD-10-CM

## 2015-12-07 DIAGNOSIS — B370 Candidal stomatitis: Secondary | ICD-10-CM

## 2015-12-07 DIAGNOSIS — Z5111 Encounter for antineoplastic chemotherapy: Secondary | ICD-10-CM | POA: Diagnosis not present

## 2015-12-07 DIAGNOSIS — Z7901 Long term (current) use of anticoagulants: Secondary | ICD-10-CM | POA: Diagnosis not present

## 2015-12-07 LAB — PROTIME-INR (CHCC SATELLITE)
INR: 1.9 — ABNORMAL LOW (ref 2.0–3.5)
PROTIME: 22.8 s — AB (ref 10.6–13.4)

## 2015-12-07 LAB — CMP (CANCER CENTER ONLY)
ALBUMIN: 3.4 g/dL (ref 3.3–5.5)
ALK PHOS: 64 U/L (ref 26–84)
ALT(SGPT): 14 U/L (ref 10–47)
AST: 32 U/L (ref 11–38)
BILIRUBIN TOTAL: 0.6 mg/dL (ref 0.20–1.60)
BUN, Bld: 15 mg/dL (ref 7–22)
CALCIUM: 9.6 mg/dL (ref 8.0–10.3)
CO2: 26 meq/L (ref 18–33)
Chloride: 103 mEq/L (ref 98–108)
Creat: 1.2 mg/dl (ref 0.6–1.2)
Glucose, Bld: 195 mg/dL — ABNORMAL HIGH (ref 73–118)
POTASSIUM: 3.8 meq/L (ref 3.3–4.7)
Sodium: 140 mEq/L (ref 128–145)
Total Protein: 6.5 g/dL (ref 6.4–8.1)

## 2015-12-07 LAB — CBC WITH DIFFERENTIAL (CANCER CENTER ONLY)
BASO#: 0.1 10*3/uL (ref 0.0–0.2)
BASO%: 1.3 % (ref 0.0–2.0)
EOS ABS: 0 10*3/uL (ref 0.0–0.5)
EOS%: 0.2 % (ref 0.0–7.0)
HCT: 30.5 % — ABNORMAL LOW (ref 38.7–49.9)
HEMOGLOBIN: 10.4 g/dL — AB (ref 13.0–17.1)
LYMPH#: 0.7 10*3/uL — ABNORMAL LOW (ref 0.9–3.3)
LYMPH%: 15.7 % (ref 14.0–48.0)
MCH: 31.7 pg (ref 28.0–33.4)
MCHC: 34.1 g/dL (ref 32.0–35.9)
MCV: 93 fL (ref 82–98)
MONO#: 0.6 10*3/uL (ref 0.1–0.9)
MONO%: 13.7 % — AB (ref 0.0–13.0)
NEUT#: 3.2 10*3/uL (ref 1.5–6.5)
NEUT%: 69.1 % (ref 40.0–80.0)
Platelets: 218 10*3/uL (ref 145–400)
RBC: 3.28 10*6/uL — ABNORMAL LOW (ref 4.20–5.70)
RDW: 16.5 % — AB (ref 11.1–15.7)
WBC: 4.7 10*3/uL (ref 4.0–10.0)

## 2015-12-07 MED ORDER — VINCRISTINE SULFATE CHEMO INJECTION 1 MG/ML
1.0000 mg | Freq: Once | INTRAVENOUS | Status: AC
  Administered 2015-12-07: 1 mg via INTRAVENOUS
  Filled 2015-12-07: qty 1

## 2015-12-07 MED ORDER — ACETAMINOPHEN 325 MG PO TABS
ORAL_TABLET | ORAL | Status: AC
  Filled 2015-12-07: qty 2

## 2015-12-07 MED ORDER — HEPARIN SOD (PORK) LOCK FLUSH 100 UNIT/ML IV SOLN
500.0000 [IU] | Freq: Once | INTRAVENOUS | Status: AC | PRN
  Administered 2015-12-07: 500 [IU]
  Filled 2015-12-07: qty 5

## 2015-12-07 MED ORDER — PALONOSETRON HCL INJECTION 0.25 MG/5ML
0.2500 mg | Freq: Once | INTRAVENOUS | Status: AC
  Administered 2015-12-07: 0.25 mg via INTRAVENOUS

## 2015-12-07 MED ORDER — PEGFILGRASTIM 6 MG/0.6ML ~~LOC~~ PSKT
6.0000 mg | PREFILLED_SYRINGE | Freq: Once | SUBCUTANEOUS | Status: AC
  Administered 2015-12-07: 6 mg via SUBCUTANEOUS

## 2015-12-07 MED ORDER — PALONOSETRON HCL INJECTION 0.25 MG/5ML
INTRAVENOUS | Status: AC
  Filled 2015-12-07: qty 5

## 2015-12-07 MED ORDER — DIPHENHYDRAMINE HCL 25 MG PO CAPS
50.0000 mg | ORAL_CAPSULE | Freq: Once | ORAL | Status: AC
  Administered 2015-12-07: 50 mg via ORAL

## 2015-12-07 MED ORDER — DIPHENHYDRAMINE HCL 25 MG PO CAPS
ORAL_CAPSULE | ORAL | Status: AC
  Filled 2015-12-07: qty 2

## 2015-12-07 MED ORDER — DOXORUBICIN HCL CHEMO IV INJECTION 2 MG/ML
37.5000 mg/m2 | Freq: Once | INTRAVENOUS | Status: AC
  Administered 2015-12-07: 90 mg via INTRAVENOUS
  Filled 2015-12-07: qty 45

## 2015-12-07 MED ORDER — SODIUM CHLORIDE 0.9 % IV SOLN
375.0000 mg/m2 | Freq: Once | INTRAVENOUS | Status: AC
  Administered 2015-12-07: 900 mg via INTRAVENOUS
  Filled 2015-12-07: qty 50

## 2015-12-07 MED ORDER — SODIUM CHLORIDE 0.9% FLUSH
10.0000 mL | INTRAVENOUS | Status: DC | PRN
  Administered 2015-12-07: 10 mL
  Filled 2015-12-07: qty 10

## 2015-12-07 MED ORDER — ACETAMINOPHEN 325 MG PO TABS
650.0000 mg | ORAL_TABLET | Freq: Once | ORAL | Status: AC
  Administered 2015-12-07: 650 mg via ORAL

## 2015-12-07 MED ORDER — SODIUM CHLORIDE 0.9 % IV SOLN
562.5000 mg/m2 | Freq: Once | INTRAVENOUS | Status: AC
  Administered 2015-12-07: 1340 mg via INTRAVENOUS
  Filled 2015-12-07: qty 67

## 2015-12-07 MED ORDER — DEXAMETHASONE SODIUM PHOSPHATE 10 MG/ML IJ SOLN
INTRAMUSCULAR | Status: AC
  Filled 2015-12-07: qty 1

## 2015-12-07 MED ORDER — DEXAMETHASONE SODIUM PHOSPHATE 10 MG/ML IJ SOLN
10.0000 mg | Freq: Once | INTRAMUSCULAR | Status: AC
  Administered 2015-12-07: 10 mg via INTRAVENOUS

## 2015-12-07 MED ORDER — SODIUM CHLORIDE 0.9 % IV SOLN
Freq: Once | INTRAVENOUS | Status: AC
  Administered 2015-12-07: 09:00:00 via INTRAVENOUS

## 2015-12-07 NOTE — Progress Notes (Signed)
Hematology and Oncology Follow Up Visit  Teofilo Lupinacci [id number] 11-19-1933 82 y.o. 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 Proffit is back for follow-up. He actually looks quite good. He is not coughing at night any longer. I'm not sure as to why this stopped.  His color looks pretty good.  He did have a nice Thanksgiving.  He's had no bleeding. He is on Coumadin. Thereafter his blood sugars have been on the high side.  He's had no headache. He's had no visual issues.  He saw a lot of swelling in his legs. I did this is more so related to cardiac issues. It might be related somehow to the chronic anemia that he has with chemotherapy. aspoon at bedtime.  His blood sugars have been doing okay. His Coumadin level has been better monitored. His INR today was 1.9.  His appetite seems to be doing fairly well. He's had no nausea or vomiting.  Overall, I said that his performance status is ECOG 1-2.  Medications:  Current Outpatient Prescriptions:  .  ACCU-CHEK AVIVA PLUS test strip, TEST 3 TIMES A DAY, Disp: 100 each, Rfl: 3 .  ACCU-CHEK AVIVA PLUS test strip, TEST 3 TIMES A DAY, Disp: 300 each, Rfl: 5 .  acetaminophen (TYLENOL) 500 MG tablet, Take 500 mg by mouth every 6 (six) hours as needed for mild pain., Disp: , Rfl:  .  atorvastatin (LIPITOR) 40 MG tablet, TAKE 1 TABLET BY MOUTH DAILY, Disp: 90 tablet, Rfl: 2 .  clonazePAM (KLONOPIN) 1 MG tablet, TAKE 1 TABLET EVERY DAY AT BEDTIME AS NEEDED, Disp: 30 tablet, Rfl: 5 .  clopidogrel (PLAVIX) 75 MG tablet, TAKE 1 TABLET DAILY WITH BREAKFAST, Disp: 30 tablet, Rfl: 5 .  clopidogrel (PLAVIX) 75 MG tablet, TAKE 1 TABLET BY MOUTH DAILY, Disp: 30 tablet, Rfl: 8 .  fenofibrate micronized (LOFIBRA) 134 MG capsule, Take 1 capsule by mouth  daily before breakfast, Disp: 90 capsule, Rfl: 1 .   fluconazole (DIFLUCAN) 100 MG tablet, Take 1 tablet (100 mg total) by mouth daily., Disp: 30 tablet, Rfl: 2 .  furosemide (LASIX) 40 MG tablet, Take 1 tablet (40 mg total) by mouth daily., Disp: 30 tablet, Rfl: 1 .  gabapentin (NEURONTIN) 300 MG capsule, Take 2 capsules by mouth 3  times a day (Patient taking differently: one capsule twice daily), Disp: 540 capsule, Rfl: 1 .  HYDROcodone-homatropine (HYCODAN) 5-1.5 MG/5ML syrup, Take 5 mLs by mouth every 6 (six) hours as needed for cough., Disp: 120 mL, Rfl: 0 .  Insulin Glargine (TOUJEO SOLOSTAR) 300 UNIT/ML SOPN, Inject 20 Units into the skin daily after breakfast. , Disp: , Rfl:  .  insulin lispro (HUMALOG) 100 UNIT/ML injection, Take 1 times daily with meals as per sliding scale., Disp: , Rfl:  .  ipratropium (ATROVENT HFA) 17 MCG/ACT inhaler, Inhale 2 puffs into the lungs every 4 (four) hours as needed for wheezing., Disp: 1 Inhaler, Rfl: 12 .  lidocaine-prilocaine (EMLA) cream, Apply to affected area once, Disp: 30 g, Rfl: 3 .  LORazepam (ATIVAN) 0.5 MG tablet, TAKE 1 TABLET EVERY 6 HOURS AS NEEDED NASUSEA OR VOMITING, Disp: 30 tablet, Rfl: 0 .  metFORMIN (GLUCOPHAGE) 500 MG tablet, TAKE 1 TABLET (500 MG TOTAL) BY MOUTH 2 (TWO) TIMES DAILY WITH A MEAL., Disp: 60 tablet, Rfl: 5 .  metoprolol succinate (TOPROL-XL) 100 MG 24 hr tablet, TAKE 1 TABLET EVERY DAY IMMEDIATELY FOLLOWING A MEAL, Disp: 90 tablet, Rfl: 1 .  nitroGLYCERIN (NITROSTAT) 0.4 MG SL tablet, Place 1 tablet (0.4 mg total) under the tongue every 5 (five) minutes as needed. Chest pain, Disp: 25 tablet, Rfl: 6 .  ondansetron (ZOFRAN) 8 MG tablet, Take 1 tablet (8 mg total) by mouth 2 (two) times daily as needed for refractory nausea / vomiting. Start on day 3 after cyclophosphamide chemotherapy., Disp: 30 tablet, Rfl: 1 .  oxybutynin (DITROPAN XL) 15 MG 24 hr tablet, , Disp: , Rfl:  .  pantoprazole (PROTONIX) 40 MG tablet, TAKE 1 TABLET EVERY DAY, Disp: 90 tablet, Rfl: 2 .  pramipexole  (MIRAPEX) 1.5 MG tablet, MAY TAKE 1 TO 1 &1/2 TABLETS BY MOUTH AT BEDTIME, Disp: 60 tablet, Rfl: 5 .  predniSONE (DELTASONE) 20 MG tablet, Take 3 tablets (60 mg total) by mouth daily. Take on days 1-5 of chemotherapy., Disp: 15 tablet, Rfl: 6 .  prochlorperazine (COMPAZINE) 10 MG tablet, Take 1 tablet (10 mg total) by mouth every 6 (six) hours as needed (Nausea or vomiting)., Disp: 30 tablet, Rfl: 6 .  tamsulosin (FLOMAX) 0.4 MG CAPS capsule, Take 0.4 mg by mouth daily as needed. For fluid, Disp: , Rfl:  .  Trospium Chloride 60 MG CP24, , Disp: , Rfl:  .  vitamin B-12 (CYANOCOBALAMIN) 1000 MCG tablet, Take 1 tablet (1,000 mcg total) by mouth daily., Disp: 30 tablet, Rfl: 0 .  warfarin (COUMADIN) 5 MG tablet, Take 1 tablet (5 mg total) by mouth daily. Except on Mon, Wed, Fri take 7.5 mg, Disp: 40 tablet, Rfl: 1 No current facility-administered medications for this visit.   Facility-Administered Medications Ordered in Other Visits:  .  testosterone cypionate (DEPOTESTOTERONE CYPIONATE) injection 200 mg, 200 mg, Intramuscular, Q28 days, [name], MD, 200 mg at 12/26/11 0850 .  testosterone cypionate (DEPOTESTOTERONE CYPIONATE) injection 200 mg, 200 mg, Intramuscular, Q28 days, [name], MD, 200 mg at 01/23/12 0908 .  testosterone cypionate (DEPOTESTOTERONE CYPIONATE) injection 200 mg, 200 mg, Intramuscular, Q28 days, [name], MD, 200 mg at 02/25/12 0848 .  testosterone cypionate (DEPOTESTOTERONE CYPIONATE) injection 200 mg, 200 mg, Intramuscular, Q28 days, [name], MD, 200 mg at 06/03/12 1437  Allergies:  Allergies  Allergen Reactions  . Ace Inhibitors Other (See Comments)    cough  . Codeine Nausea Only and Rash       . Penicillins Rash    Childhood allergy Has patient had a PCN reaction causing immediate rash, facial/tongue/throat swelling, SOB or lightheadedness with hypotension: Yes Has patient had a PCN reaction causing severe rash involving mucus  membranes or skin necrosis: Yes Has patient had a PCN reaction that required hospitalization No Has patient had a PCN reaction occurring within the last 10 years: No If all of the above answers are "NO", then may proceed with Cephalosporin use.     Past Medical History, Surgical history, Social history, and Family History were reviewed and updated.  Review of Systems:  As above  Physical Exam:  vitals were not taken for this visit.  Wt Readings from Last 3 Encounters:  12/07/15 235 lb (106.6 kg)  11/16/15 236 lb 12.8 oz (107.4 kg)  10/26/15 237 lb (107.5 kg)      Elderly white male. He started to lose his hair. Head and neck exam shows no ocular or oral lesions. There are no palpable cervical or supraclavicular lymph  nodes. He has no oral thrush. There is no oral mucositis. There is no palpable adenopathy in the neck. Lungs are clear to percussion and auscultation bilaterally. Cardiac exam regular rate and rhythm consistent with atrial fibrillation. He has no murmurs, rubs or bruits. Abdomen is soft. He has decent bowel sounds. There is no fluid wave. There is no guarding or rebound tenderness. He has no palpable hepatosplenomegaly. He has a healed right inguinal orchiectomy scar. Extremities shows chronic 2+ edema in his legs. Neurological exam shows no focal neurological deficits. Skin exam shows some brawny edema in his lower legs.  Lab Results  Component Value Date   WBC 4.7 12/07/2015   HGB 10.4 (L) 12/07/2015   HCT 30.5 (L) 12/07/2015   MCV 93 12/07/2015   PLT 218 12/07/2015     Chemistry      Component Value Date/Time   NA 140 12/07/2015 0740   NA 137 11/29/2015 1033   K 3.8 12/07/2015 0740   K 3.6 11/29/2015 1033   CL 103 12/07/2015 0740   CO2 26 12/07/2015 0740   CO2 25 11/29/2015 1033   BUN 15 12/07/2015 0740   BUN 17.3 11/29/2015 1033   CREATININE 1.2 12/07/2015 0740   CREATININE 1.2 11/29/2015 1033      Component Value Date/Time   CALCIUM 9.6 12/07/2015  0740   CALCIUM 9.5 11/29/2015 1033   ALKPHOS 64 12/07/2015 0740   ALKPHOS 84 11/29/2015 1033   AST 32 12/07/2015 0740   AST 21 11/29/2015 1033   ALT 14 12/07/2015 0740   ALT 10 11/29/2015 1033   BILITOT 0.60 12/07/2015 0740   BILITOT 0.41 11/29/2015 1033         Impression and Plan: Mr. Serpa is A 82 year old white male. He has testicular lymphoma. He is on systemic chemotherapy. He has a marginal performance status. As such, we can really cannot be all that aggressive with him.  I don't think he would be a great candidate for intrathecal chemotherapy. He would be a candidate for radiation therapy to the scrotal region.  We will go ahead with his treatment. I did this will be his last treatment. He has done very well with treatment.  We will get a PET scan after this.  I think the PET scan will tell us if we have to do any additional cycles of treatment.   He will definitely need scrotal radiation therapy.   With him being on blood thinner, I don't think that he would be a good candidate for prophylactic intrathecal therapy.   We will get the PET scan in about 3 weeks. I will then see him afterwards.  Volanda Napoleon, MD 11/30/20176:13 PM

## 2015-12-08 ENCOUNTER — Ambulatory Visit: Payer: Medicare Other

## 2015-12-09 ENCOUNTER — Other Ambulatory Visit: Payer: Self-pay | Admitting: Family Medicine

## 2015-12-12 ENCOUNTER — Telehealth: Payer: Self-pay | Admitting: Family Medicine

## 2015-12-12 NOTE — Telephone Encounter (Signed)
° ° ° °  Pt said the pharmacy told him the following me interacts with warfarim  He would like a call back concerning this     fenofibrate micronized (LOFIBRA) 134 MG capsule

## 2015-12-12 NOTE — Telephone Encounter (Signed)
He does have hx of increased triglycerides, but they have improved with his weight loss.  I would d/c fenofibrate and make sure he has follow up within 2 months.  We will repeat lipids and A1C then.

## 2015-12-12 NOTE — Telephone Encounter (Signed)
Called and spoke with pt informing him of Dr. Anastasio Auerbach recommendation's. Pt verbalized understanding and states he has an appointment Tuesday with Dr. Elease Hashimoto and will schedule a 2 month follow up at that time. Nothing further needed at this time.

## 2015-12-12 NOTE — Telephone Encounter (Signed)
Dr. Elease Hashimoto -- please confirm risk vs. benefits for patient and continuing medication regimen? Will advise patient once Dr. Elease Hashimoto provides instructions.

## 2015-12-14 ENCOUNTER — Ambulatory Visit (INDEPENDENT_AMBULATORY_CARE_PROVIDER_SITE_OTHER): Payer: Medicare Other | Admitting: General Practice

## 2015-12-14 ENCOUNTER — Other Ambulatory Visit (HOSPITAL_BASED_OUTPATIENT_CLINIC_OR_DEPARTMENT_OTHER): Payer: Medicare Other

## 2015-12-14 DIAGNOSIS — Z7901 Long term (current) use of anticoagulants: Secondary | ICD-10-CM | POA: Diagnosis not present

## 2015-12-14 DIAGNOSIS — C8589 Other specified types of non-Hodgkin lymphoma, extranodal and solid organ sites: Secondary | ICD-10-CM

## 2015-12-14 DIAGNOSIS — B3781 Candidal esophagitis: Secondary | ICD-10-CM

## 2015-12-14 DIAGNOSIS — C8339 Diffuse large B-cell lymphoma, extranodal and solid organ sites: Secondary | ICD-10-CM | POA: Diagnosis not present

## 2015-12-14 DIAGNOSIS — I480 Paroxysmal atrial fibrillation: Secondary | ICD-10-CM

## 2015-12-14 DIAGNOSIS — E860 Dehydration: Secondary | ICD-10-CM

## 2015-12-14 DIAGNOSIS — B370 Candidal stomatitis: Secondary | ICD-10-CM

## 2015-12-14 LAB — COMPREHENSIVE METABOLIC PANEL
ALBUMIN: 3.4 g/dL — AB (ref 3.5–5.0)
ALK PHOS: 102 U/L (ref 40–150)
ALT: 10 U/L (ref 0–55)
ANION GAP: 10 meq/L (ref 3–11)
AST: 22 U/L (ref 5–34)
BUN: 18.4 mg/dL (ref 7.0–26.0)
CALCIUM: 9.5 mg/dL (ref 8.4–10.4)
CHLORIDE: 104 meq/L (ref 98–109)
CO2: 24 mEq/L (ref 22–29)
CREATININE: 0.9 mg/dL (ref 0.7–1.3)
EGFR: 76 mL/min/{1.73_m2} — ABNORMAL LOW (ref >90)
Glucose: 204 mg/dl — ABNORMAL HIGH (ref 70–140)
POTASSIUM: 3.7 meq/L (ref 3.5–5.1)
Sodium: 137 mEq/L (ref 136–145)
Total Bilirubin: 0.58 mg/dL (ref 0.20–1.20)
Total Protein: 6.6 g/dL (ref 6.4–8.3)

## 2015-12-14 LAB — CBC WITH DIFFERENTIAL (CANCER CENTER ONLY)
BASO#: 0 10*3/uL (ref 0.0–0.2)
BASO%: 0.7 % (ref 0.0–2.0)
EOS ABS: 0.1 10*3/uL (ref 0.0–0.5)
EOS%: 1.1 % (ref 0.0–7.0)
HCT: 30.6 % — ABNORMAL LOW (ref 38.7–49.9)
HEMOGLOBIN: 10.3 g/dL — AB (ref 13.0–17.1)
LYMPH#: 0.6 10*3/uL — AB (ref 0.9–3.3)
LYMPH%: 12.8 % — ABNORMAL LOW (ref 14.0–48.0)
MCH: 31.2 pg (ref 28.0–33.4)
MCHC: 33.7 g/dL (ref 32.0–35.9)
MCV: 93 fL (ref 82–98)
MONO#: 0.3 10*3/uL (ref 0.1–0.9)
MONO%: 6.6 % (ref 0.0–13.0)
NEUT#: 3.6 10*3/uL (ref 1.5–6.5)
NEUT%: 78.8 % (ref 40.0–80.0)
Platelets: 150 10*3/uL (ref 145–400)
RBC: 3.3 10*6/uL — AB (ref 4.20–5.70)
RDW: 15 % (ref 11.1–15.7)
WBC: 4.5 10*3/uL (ref 4.0–10.0)

## 2015-12-14 LAB — PROTIME-INR (CHCC SATELLITE)
INR: 1.3 — AB (ref 2.0–3.5)
PROTIME: 15.6 s — AB (ref 10.6–13.4)

## 2015-12-14 NOTE — Patient Instructions (Signed)
Pre visit review using our clinic review tool, if applicable. No additional management support is needed unless otherwise documented below in the visit note. Patient had labs today at Dr. Dicie Beam office.  INR was 1.3.  Patient called me to find out what to take.  It appears that patient has not been following the prescribed dosage and has also missed several doses.  I asked patient to take 7.5 mg of coumadin today and tomorrow and then to take 5 mg daily until re-checked next Tuesday 12/12.  I explained to patient the risks of a sub-therapeutic INR.  Patient verbalized understanding.

## 2015-12-14 NOTE — Progress Notes (Signed)
I have reviewed and agree with the plan. 

## 2015-12-19 ENCOUNTER — Encounter: Payer: Self-pay | Admitting: Family Medicine

## 2015-12-19 ENCOUNTER — Telehealth: Payer: Self-pay | Admitting: General Practice

## 2015-12-19 ENCOUNTER — Ambulatory Visit (INDEPENDENT_AMBULATORY_CARE_PROVIDER_SITE_OTHER): Payer: Medicare Other | Admitting: General Practice

## 2015-12-19 ENCOUNTER — Ambulatory Visit (INDEPENDENT_AMBULATORY_CARE_PROVIDER_SITE_OTHER): Payer: Medicare Other | Admitting: Family Medicine

## 2015-12-19 VITALS — BP 110/70 | HR 105 | Temp 98.1°F | Ht 70.0 in | Wt 230.0 lb

## 2015-12-19 DIAGNOSIS — I10 Essential (primary) hypertension: Secondary | ICD-10-CM

## 2015-12-19 DIAGNOSIS — E1140 Type 2 diabetes mellitus with diabetic neuropathy, unspecified: Secondary | ICD-10-CM | POA: Diagnosis not present

## 2015-12-19 DIAGNOSIS — C8589 Other specified types of non-Hodgkin lymphoma, extranodal and solid organ sites: Secondary | ICD-10-CM

## 2015-12-19 DIAGNOSIS — E785 Hyperlipidemia, unspecified: Secondary | ICD-10-CM | POA: Diagnosis not present

## 2015-12-19 DIAGNOSIS — E1165 Type 2 diabetes mellitus with hyperglycemia: Secondary | ICD-10-CM

## 2015-12-19 DIAGNOSIS — I480 Paroxysmal atrial fibrillation: Secondary | ICD-10-CM

## 2015-12-19 DIAGNOSIS — Z7901 Long term (current) use of anticoagulants: Secondary | ICD-10-CM | POA: Diagnosis not present

## 2015-12-19 DIAGNOSIS — R600 Localized edema: Secondary | ICD-10-CM

## 2015-12-19 LAB — POCT INR: INR: 1.1

## 2015-12-19 LAB — POCT GLYCOSYLATED HEMOGLOBIN (HGB A1C): Hemoglobin A1C: 7.8

## 2015-12-19 NOTE — Progress Notes (Signed)
Pre visit review using our clinic review tool, if applicable. No additional management support is needed unless otherwise documented below in the visit note. 

## 2015-12-19 NOTE — Patient Instructions (Addendum)
Take the Oxycodone one half to one tablet at night as needed. Increased furosemide 40 mg to 1-1/2 tablets for the next 3 days and then drop back to 40 mg once daily Elevate legs frequently

## 2015-12-19 NOTE — Telephone Encounter (Signed)
LMOVM to call Villa Herb, RN @ [phone].

## 2015-12-19 NOTE — Progress Notes (Signed)
Medication Samples have been provided to the patient.  Drug name: Tyler Aas     Strength:        Qty: 2 LOT: Har[place].Crick  Exp.Date: 01/2017  Verbal order received for patient to receive sample medication. Patient teaching completed by provider.   Medication (including dosage): Humalog  Lot #: [id number] Expiration: 00/2019  Qty: 1  The patient has been instructed regarding the correct time, dose, and frequency of taking this medication, including desired effects and most common side effects.   Autumn Jerilynn Mages McNeil 11:08 AM 12/19/2015

## 2015-12-19 NOTE — Patient Instructions (Signed)
Pre visit review using our clinic review tool, if applicable. No additional management support is needed unless otherwise documented below in the visit note. 

## 2015-12-19 NOTE — Progress Notes (Signed)
Subjective:     Patient ID: Gary Mesick., male   DOB: [birth year], 82 y.o.   MRN: [id number]  HPI Patient seen for medical follow-up. Several months ago he had rapid increase in right testicle mass and went to urologist and had biopsy which revealed non-Hodgkin's lymphoma. He had extensive chemotherapy and follow-up with oncology since then and is doing well overall. His weight is down 16 pounds from we saw him last in June. Diabetes fairly well controlled. On Lantus 20 mg daily and takes Humalog 4 units once daily.  Recent fasting blood sugars mostly around low 100s. He still has poor appetite. Recent chemistries reviewed. Albumin 3.4.  He has had several brief courses of steroid pre-chemotherapy treatment.    He complains of bilateral leg edema. Takes Lasix 40 mg once daily. Recent albumin 3.4. Edema usually worse late in the day. No orthopnea. No dyspnea.  Low back pain. Had back surgery about a year ago and has recently had some increasing lower lumbar back pain. He has scheduled follow-up with neurosurgeon but has not yet seen them. Pain is frequent 7 out of 10 sometimes radiates down the right lower extremity. He has severe pain that frequently interfering with sleep at night. Has not taken any pain medications recently.  Past Medical History:  Diagnosis Date  . Anxiety   . Atrial fibrillation ([place])   . COPD (chronic obstructive pulmonary disease) ([place])    pt. denies  . Coronary artery disease    a. h/o Overlapping stents RCA;  b. 06/2011 Cath: patent stents, nonobs dzs, NL EF.  . Diabetic peripheral neuropathy ([place])   . Diffuse non-Hodgkin's lymphoma of testis ([place]) 09/28/2015  . DM (diabetes mellitus) ([place])    Type 2, peripheral neuropathy.  . Dyspnea    with exertion  . Dysrhythmia   . GERD (gastroesophageal reflux disease)   . History of bronchitis   . History of kidney stones   . Hyperlipidemia   . Hypertension   . Low testosterone   . Nephrolithiasis   . Osteoarthritis     shoulder  . Restless leg   . SVT (supraventricular tachycardia) ([place])   . Urinary frequency    Past Surgical History:  Procedure Laterality Date  . [facility]  . CARDIAC CATHETERIZATION  01/2013  . CHOLECYSTECTOMY    . COLONOSCOPY    . CORONARY ANGIOPLASTY  2004  . EYE SURGERY Bilateral    cataracts  . IR GENERIC HISTORICAL  10/05/2015   IR US GUIDE VASC ACCESS RIGHT 10/05/2015 Marybelle Killings, MD [facility]  . IR GENERIC HISTORICAL  10/05/2015   IR FLUORO GUIDE PORT INSERTION RIGHT 10/05/2015 Marybelle Killings, MD [facility]  . LEFT HEART CATHETERIZATION WITH CORONARY ANGIOGRAM N/A 06/18/2011   Procedure: LEFT HEART CATHETERIZATION WITH CORONARY ANGIOGRAM;  Surgeon: Peter M Martinique, MD;  [facility];  Service: Cardiovascular;  Laterality: N/A;  . LEFT HEART CATHETERIZATION WITH CORONARY ANGIOGRAM N/A 01/27/2013   Procedure: LEFT HEART CATHETERIZATION WITH CORONARY ANGIOGRAM;  Surgeon: Burnell Blanks, MD;  Location: [facility] CATH LAB;  Service: Cardiovascular;  Laterality: N/A;  . LUMBAR LAMINECTOMY/DECOMPRESSION MICRODISCECTOMY N/A 02/07/2015   Procedure: Lumbar three-Sacral one Decompression;  Surgeon: Kevan Ny Ditty, MD;  Location: [facility]URO ORS;  Service: Neurosurgery;  Laterality: N/A;  L3 to S1 Decompression  . ORCHIECTOMY Right 09/01/2015   Procedure: RIGHT ORCHIECTOMY;  Surgeon: Kathie Rhodes, MD;  Location: [facility] ORS;  Service: Urology;  Laterality: Right;  . ROTATOR  CUFF REPAIR Left     reports that he quit smoking about 32 years ago. His smoking use included Cigarettes. He has a 30.00 pack-year smoking history. He has never used smokeless tobacco. He reports that he does not drink alcohol or use drugs. family history includes Alzheimer's disease in his mother; Arthritis in his brother and sister; Heart disease in his brother, father, mother, and sister; Migraines in his daughter and father; Obesity in his sister and son; Sleep apnea in his son; Thyroid disease in  his daughter; Ulcers in his father.    Review of Systems  Constitutional: Positive for appetite change and fatigue. Negative for chills and fever.  Eyes: Negative for visual disturbance.  Respiratory: Negative for cough, chest tightness and shortness of breath.   Cardiovascular: Positive for leg swelling. Negative for chest pain and palpitations.  Genitourinary: Negative for dysuria.  Musculoskeletal: Positive for back pain.  Neurological: Negative for dizziness, syncope, weakness, light-headedness and headaches.       Objective:   Physical Exam  Constitutional: He is oriented to person, place, and time. He appears well-developed and well-nourished.  HENT:  Mouth/Throat: Oropharynx is clear and moist.  Neck: Neck supple.  Cardiovascular: Normal rate and regular rhythm.   Pulmonary/Chest: Effort normal and breath sounds normal. No respiratory distress. He has no wheezes. He has no rales.  Musculoskeletal: He exhibits edema.  Neurological: He is alert and oriented to person, place, and time.       Assessment:     #1 non-Hodgkin's lymphoma followed by oncology. He has completed chemotherapy  #2 bilateral leg edema. Likely multifactorial.  #3 history of chronic atrial fibrillation on Coumadin  #4 chronic low back pain  #5 type 2 diabetes with history of poor control. Most recent A1c 7.4%    Plan:     -recheck hemoglobin A1c (7.8%).   -consider adding another dose of Humalog 4 units to dinner meal. -increase Lasix to one and one half tablets daily for the next 3 days and then drop back to 40 mg daily. -Patient has some oxycodone 5 mg at home and we recommended one half to one tablet at night as needed for severe pain until he can see his neurosurgeon -Continue close follow-up with oncology -routine follow up in 3 months.  [name] MD [facility] at [facility]

## 2015-12-20 NOTE — Progress Notes (Signed)
Hi Dr. Elease Hashimoto,  I spoke with Mr. Lanzer.  He said that he has been taking his coumadin.  He said that Dr. Marin Olp adjusted it at some point.  Anyway  I re-adjusted it and will check his INR next Wednesday.  Just wanted to let you know!  Cindy B.

## 2015-12-21 ENCOUNTER — Other Ambulatory Visit (HOSPITAL_BASED_OUTPATIENT_CLINIC_OR_DEPARTMENT_OTHER): Payer: Medicare Other

## 2015-12-21 ENCOUNTER — Ambulatory Visit (HOSPITAL_BASED_OUTPATIENT_CLINIC_OR_DEPARTMENT_OTHER): Payer: Medicare Other | Admitting: Hematology & Oncology

## 2015-12-21 DIAGNOSIS — C8589 Other specified types of non-Hodgkin lymphoma, extranodal and solid organ sites: Secondary | ICD-10-CM

## 2015-12-21 DIAGNOSIS — B370 Candidal stomatitis: Secondary | ICD-10-CM

## 2015-12-21 DIAGNOSIS — Z7901 Long term (current) use of anticoagulants: Secondary | ICD-10-CM

## 2015-12-21 DIAGNOSIS — B3781 Candidal esophagitis: Secondary | ICD-10-CM

## 2015-12-21 DIAGNOSIS — E860 Dehydration: Secondary | ICD-10-CM

## 2015-12-21 LAB — CMP (CANCER CENTER ONLY)
ALBUMIN: 3.3 g/dL (ref 3.3–5.5)
ALK PHOS: 90 U/L — AB (ref 26–84)
ALT: 14 U/L (ref 10–47)
AST: 31 U/L (ref 11–38)
BUN: 11 mg/dL (ref 7–22)
CO2: 27 mEq/L (ref 18–33)
CREATININE: 0.9 mg/dL (ref 0.6–1.2)
Calcium: 9.7 mg/dL (ref 8.0–10.3)
Chloride: 100 mEq/L (ref 98–108)
Glucose, Bld: 195 mg/dL — ABNORMAL HIGH (ref 73–118)
POTASSIUM: 4 meq/L (ref 3.3–4.7)
SODIUM: 136 meq/L (ref 128–145)
TOTAL PROTEIN: 6.6 g/dL (ref 6.4–8.1)
Total Bilirubin: 0.6 mg/dl (ref 0.20–1.60)

## 2015-12-21 LAB — CBC WITH DIFFERENTIAL (CANCER CENTER ONLY)
BASO#: 0.1 10*3/uL (ref 0.0–0.2)
BASO%: 1.5 % (ref 0.0–2.0)
EOS ABS: 0 10*3/uL (ref 0.0–0.5)
EOS%: 0.3 % (ref 0.0–7.0)
HEMATOCRIT: 31.8 % — AB (ref 38.7–49.9)
HGB: 10.8 g/dL — ABNORMAL LOW (ref 13.0–17.1)
LYMPH#: 0.8 10*3/uL — AB (ref 0.9–3.3)
LYMPH%: 11.4 % — AB (ref 14.0–48.0)
MCH: 31.5 pg (ref 28.0–33.4)
MCHC: 34 g/dL (ref 32.0–35.9)
MCV: 93 fL (ref 82–98)
MONO#: 0.7 10*3/uL (ref 0.1–0.9)
MONO%: 11.1 % (ref 0.0–13.0)
NEUT#: 5.1 10*3/uL (ref 1.5–6.5)
NEUT%: 75.7 % (ref 40.0–80.0)
Platelets: 201 10*3/uL (ref 145–400)
RBC: 3.43 10*6/uL — ABNORMAL LOW (ref 4.20–5.70)
RDW: 16 % — AB (ref 11.1–15.7)
WBC: 6.7 10*3/uL (ref 4.0–10.0)

## 2015-12-21 LAB — PROTIME-INR (CHCC SATELLITE)
INR: 1.2 — ABNORMAL LOW (ref 2.0–3.5)
Protime: 14.4 Seconds — ABNORMAL HIGH (ref 10.6–13.4)

## 2015-12-21 NOTE — Progress Notes (Signed)
Hematology and Oncology Follow Up Visit  Gary Yates [id number] 1933-10-29 82 y.o. 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 He was just getting lab work done. He wanted to talk to me. He says that he just doesn't feel well. He feels tired. He does not have a lot of energy.  I told him that it probably will take a good 4 months before he finally starts feeling better. I told him that for each cycle of treatment that he takes, it takes about a month to improve. Given his age, and his other health issues, I think it might take longer.   He has chronic problems that have nothing to do with his lymphoma. He's not sleeping. He has leg swelling.  He is on Coumadin. His INR today is 1.2. I told him to take 7.5 mg a day until he has his Coumadin level checked on Monday. He and his wife understand this.   He is due for a PET scan after Christmas.   He has no fever. He's had no bleeding.    Overall, I would say that his performance status is ECOG 1-2.  Medications:  Current Outpatient Prescriptions:  .  ACCU-CHEK AVIVA PLUS test strip, TEST 3 TIMES A DAY, Disp: 300 each, Rfl: 5 .  acetaminophen (TYLENOL) 500 MG tablet, Take 500 mg by mouth every 6 (six) hours as needed for mild pain., Disp: , Rfl:  .  atorvastatin (LIPITOR) 40 MG tablet, TAKE 1 TABLET BY MOUTH DAILY, Disp: 90 tablet, Rfl: 2 .  clonazePAM (KLONOPIN) 1 MG tablet, TAKE 1 TABLET EVERY DAY AT BEDTIME AS NEEDED, Disp: 30 tablet, Rfl: 5 .  clopidogrel (PLAVIX) 75 MG tablet, TAKE 1 TABLET BY MOUTH DAILY, Disp: 30 tablet, Rfl: 8 .  furosemide (LASIX) 40 MG tablet, Take 1 tablet (40 mg total) by mouth daily., Disp: 30 tablet, Rfl: 1 .  gabapentin (NEURONTIN) 300 MG capsule, Take 2 capsules by mouth 3  times a day (Patient taking differently: one capsule  twice daily), Disp: 540 capsule, Rfl: 1 .  Insulin Glargine (TOUJEO SOLOSTAR) 300 UNIT/ML SOPN, Inject 20 Units into the skin daily after breakfast. , Disp: , Rfl:  .  insulin lispro (HUMALOG) 100 UNIT/ML injection, Take 1 times daily with meals as per sliding scale., Disp: , Rfl:  .  metFORMIN (GLUCOPHAGE) 500 MG tablet, TAKE 1 TABLET (500 MG TOTAL) BY MOUTH 2 (TWO) TIMES DAILY WITH A MEAL., Disp: 60 tablet, Rfl: 5 .  metoprolol succinate (TOPROL-XL) 100 MG 24 hr tablet, TAKE 1 TABLET EVERY DAY IMMEDIATELY FOLLOWING A MEAL, Disp: 90 tablet, Rfl: 1 .  nitroGLYCERIN (NITROSTAT) 0.4 MG SL tablet, Place 1 tablet (0.4 mg total) under the tongue every 5 (five) minutes as needed. Chest pain, Disp: 25 tablet, Rfl: 6 .  oxybutynin (DITROPAN XL) 15 MG 24 hr tablet, , Disp: , Rfl:  .  pantoprazole (PROTONIX) 40 MG tablet, TAKE 1 TABLET BY MOUTH EVERY DAY, Disp: 90 tablet, Rfl: 3 .  vitamin B-12 (CYANOCOBALAMIN) 1000 MCG tablet, Take 1 tablet (1,000 mcg total) by mouth daily., Disp: 30 tablet, Rfl: 0 .  warfarin (COUMADIN) 5 MG tablet, Take 1 tablet (5 mg total) by mouth daily. Except on Mon, Wed, Fri take 7.5 mg, Disp: 40 tablet, Rfl: 1 No  current facility-administered medications for this visit.   Facility-Administered Medications Ordered in Other Visits:  .  testosterone cypionate (DEPOTESTOTERONE CYPIONATE) injection 200 mg, 200 mg, Intramuscular, Q28 days, [name], MD, 200 mg at 12/26/11 0850 .  testosterone cypionate (DEPOTESTOTERONE CYPIONATE) injection 200 mg, 200 mg, Intramuscular, Q28 days, [name], MD, 200 mg at 01/23/12 0908 .  testosterone cypionate (DEPOTESTOTERONE CYPIONATE) injection 200 mg, 200 mg, Intramuscular, Q28 days, [name], MD, 200 mg at 02/25/12 0848 .  testosterone cypionate (DEPOTESTOTERONE CYPIONATE) injection 200 mg, 200 mg, Intramuscular, Q28 days, [name], MD, 200 mg at 06/03/12 1437  Allergies:  Allergies  Allergen Reactions  . Ace  Inhibitors Other (See Comments)    cough  . Codeine Nausea Only and Rash       . Penicillins Rash    Childhood allergy Has patient had a PCN reaction causing immediate rash, facial/tongue/throat swelling, SOB or lightheadedness with hypotension: Yes Has patient had a PCN reaction causing severe rash involving mucus membranes or skin necrosis: Yes Has patient had a PCN reaction that required hospitalization No Has patient had a PCN reaction occurring within the last 10 years: No If all of the above answers are "NO", then may proceed with Cephalosporin use.     Past Medical History, Surgical history, Social history, and Family History were reviewed and updated.  Review of Systems:  As above  Physical Exam:  vitals were not taken for this visit.  Wt Readings from Last 3 Encounters:  12/19/15 230 lb (104.3 kg)  12/07/15 235 lb (106.6 kg)  11/16/15 236 lb 12.8 oz (107.4 kg)      Elderly white male. He started to lose his hair. Head and neck exam shows no ocular or oral lesions. There are no palpable cervical or supraclavicular lymph nodes. He has no oral thrush. There is no oral mucositis. There is no palpable adenopathy in the neck. Lungs are clear to percussion and auscultation bilaterally. Cardiac exam regular rate and rhythm consistent with atrial fibrillation. He has no murmurs, rubs or bruits. Abdomen is soft. He has decent bowel sounds. There is no fluid wave. There is no guarding or rebound tenderness. He has no palpable hepatosplenomegaly. He has a healed right inguinal orchiectomy scar. Extremities shows chronic 2+ edema in his legs. Neurological exam shows no focal neurological deficits. Skin exam shows some brawny edema in his lower legs.  Lab Results  Component Value Date   WBC 6.7 12/21/2015   HGB 10.8 (L) 12/21/2015   HCT 31.8 (L) 12/21/2015   MCV 93 12/21/2015   PLT 201 12/21/2015     Chemistry      Component Value Date/Time   NA 136 12/21/2015 1051   NA 137  12/14/2015 1100   K 4.0 12/21/2015 1051   K 3.7 12/14/2015 1100   CL 100 12/21/2015 1051   CO2 27 12/21/2015 1051   CO2 24 12/14/2015 1100   BUN 11 12/21/2015 1051   BUN 18.4 12/14/2015 1100   CREATININE 0.9 12/21/2015 1051   CREATININE 0.9 12/14/2015 1100      Component Value Date/Time   CALCIUM 9.7 12/21/2015 1051   CALCIUM 9.5 12/14/2015 1100   ALKPHOS 90 (H) 12/21/2015 1051   ALKPHOS 102 12/14/2015 1100   AST 31 12/21/2015 1051   AST 22 12/14/2015 1100   ALT 14 12/21/2015 1051   ALT 10 12/14/2015 1100   BILITOT 0.60 12/21/2015 1051   BILITOT 0.58 12/14/2015 1100  Impression and Plan: Gary Yates is A 82 year old white male. He has testicular lymphoma. He is on systemic chemotherapy. He has a marginal performance status. As such, we can really cannot be all that aggressive with him.  I don't think he would be a great candidate for intrathecal chemotherapy. He would be a candidate for radiation therapy to the scrotal region.  I tried to reassure he and his wife. Again, given his chronic health problems, there is not much that we can do for him with these issues.  We will see what the PET scan shows. I would have to think that he is in remission.  I spent about 35 minutes with the and his wife. I just do not expect that he would have these kind of issues that I needed to talk to him about. I also told him that he really needs to see his family doctor about a lot of these issues. Hopefully he will do this.   They do have a very strong faith. I know that they will have a good Christmas.  Volanda Napoleon, MD 12/14/20176:09 PM

## 2015-12-25 ENCOUNTER — Telehealth: Payer: Self-pay | Admitting: Family Medicine

## 2015-12-25 NOTE — Telephone Encounter (Signed)
FYI

## 2015-12-25 NOTE — Telephone Encounter (Signed)
Pt is aware and agreeable

## 2015-12-25 NOTE — Telephone Encounter (Signed)
Unless BP readings  Consistently > 150/90 would observe BP for now.

## 2015-12-25 NOTE — Telephone Encounter (Signed)
Pt states he has had vertigo today and called his nurse hotline through his insurance company and they advised pt to call his dr and report bp readings. Today his bp reading were  Sitting     172/57     p  100                 110/53  laying down   128/83   p 64                         124/83 Standing  133/72    (no pulse reading)                  124/74  Pt would like to know if he needs to see the dr this week?

## 2015-12-27 ENCOUNTER — Ambulatory Visit: Payer: Medicare Other

## 2015-12-27 ENCOUNTER — Ambulatory Visit (HOSPITAL_COMMUNITY)
Admission: RE | Admit: 2015-12-27 | Discharge: 2015-12-27 | Disposition: A | Discharge status: Home / Self Care | Payer: Medicare Other | Source: Ambulatory Visit | Attending: Hematology & Oncology | Admitting: Hematology & Oncology

## 2015-12-27 ENCOUNTER — Telehealth: Payer: Self-pay | Admitting: General Practice

## 2015-12-27 DIAGNOSIS — C7972 Secondary malignant neoplasm of left adrenal gland: Secondary | ICD-10-CM | POA: Diagnosis not present

## 2015-12-27 DIAGNOSIS — C8589 Other specified types of non-Hodgkin lymphoma, extranodal and solid organ sites: Secondary | ICD-10-CM | POA: Diagnosis present

## 2015-12-27 DIAGNOSIS — N400 Enlarged prostate without lower urinary tract symptoms: Secondary | ICD-10-CM | POA: Diagnosis not present

## 2015-12-27 DIAGNOSIS — I517 Cardiomegaly: Secondary | ICD-10-CM | POA: Diagnosis not present

## 2015-12-27 DIAGNOSIS — N281 Cyst of kidney, acquired: Secondary | ICD-10-CM | POA: Insufficient documentation

## 2015-12-27 DIAGNOSIS — I2510 Atherosclerotic heart disease of native coronary artery without angina pectoris: Secondary | ICD-10-CM | POA: Insufficient documentation

## 2015-12-27 DIAGNOSIS — I700 Atherosclerosis of aorta: Secondary | ICD-10-CM | POA: Diagnosis not present

## 2015-12-27 LAB — GLUCOSE, CAPILLARY: Glucose-Capillary: 148 mg/dL — ABNORMAL HIGH (ref 65–99)

## 2015-12-27 MED ORDER — FLUDEOXYGLUCOSE F - 18 (FDG) INJECTION: 10.9000 | Freq: Once | INTRAVENOUS | Status: DC | PRN

## 2015-12-27 NOTE — Telephone Encounter (Signed)
Spoke with patient today.  Patient unable to keep coumadin appointment today due to having a PET scan today at 2.  Patient says that he is taking his coumadin as directed and that he can come in to be checked sometime next week but would not commit to a time.  I reiterated the need to be checked and explained the risks of a sub and supra therapeutic INR.  Patient voiced understanding but says that he has transportation issues.  Patient said that he will call and schedule appointment after Christmas.

## 2015-12-28 ENCOUNTER — Ambulatory Visit: Payer: Medicare Other

## 2015-12-29 ENCOUNTER — Ambulatory Visit: Payer: Medicare Other

## 2016-01-03 ENCOUNTER — Other Ambulatory Visit: Payer: Medicare Other

## 2016-01-03 ENCOUNTER — Other Ambulatory Visit: Payer: Self-pay | Admitting: Family Medicine

## 2016-01-04 ENCOUNTER — Other Ambulatory Visit: Payer: Medicare Other

## 2016-01-04 ENCOUNTER — Other Ambulatory Visit (INDEPENDENT_AMBULATORY_CARE_PROVIDER_SITE_OTHER): Payer: Medicare Other

## 2016-01-04 ENCOUNTER — Ambulatory Visit: Payer: Self-pay | Admitting: General Practice

## 2016-01-04 ENCOUNTER — Other Ambulatory Visit: Payer: Self-pay | Admitting: General Practice

## 2016-01-04 DIAGNOSIS — Z7901 Long term (current) use of anticoagulants: Secondary | ICD-10-CM | POA: Diagnosis not present

## 2016-01-04 DIAGNOSIS — I480 Paroxysmal atrial fibrillation: Secondary | ICD-10-CM

## 2016-01-04 LAB — POCT INR: INR: 1.3

## 2016-01-04 MED ORDER — WARFARIN SODIUM 5 MG PO TABS: ORAL_TABLET | ORAL | 1 refills | Status: DC

## 2016-01-04 NOTE — Telephone Encounter (Signed)
Okay to refill? 

## 2016-01-04 NOTE — Patient Instructions (Signed)
Pre visit review using our clinic review tool, if applicable. No additional management support is needed unless otherwise documented below in the visit note. 

## 2016-01-12 ENCOUNTER — Other Ambulatory Visit: Payer: Self-pay | Admitting: *Deleted

## 2016-01-12 MED ORDER — GABAPENTIN 300 MG PO CAPS: 600.0000 mg | ORAL_CAPSULE | Freq: Three times a day (TID) | ORAL | 2 refills | Status: DC

## 2016-01-16 ENCOUNTER — Other Ambulatory Visit: Payer: Self-pay | Admitting: Neurological Surgery

## 2016-01-16 DIAGNOSIS — M4727 Other spondylosis with radiculopathy, lumbosacral region: Secondary | ICD-10-CM

## 2016-01-18 ENCOUNTER — Other Ambulatory Visit (HOSPITAL_BASED_OUTPATIENT_CLINIC_OR_DEPARTMENT_OTHER): Payer: Medicare Other

## 2016-01-18 ENCOUNTER — Ambulatory Visit: Payer: Medicare Other

## 2016-01-18 ENCOUNTER — Telehealth: Payer: Self-pay | Admitting: Family Medicine

## 2016-01-18 ENCOUNTER — Ambulatory Visit (HOSPITAL_BASED_OUTPATIENT_CLINIC_OR_DEPARTMENT_OTHER): Payer: Medicare Other | Admitting: Hematology & Oncology

## 2016-01-18 ENCOUNTER — Encounter: Payer: Self-pay | Admitting: Radiation Oncology

## 2016-01-18 VITALS — BP 127/64 | HR 120 | Temp 98.2°F | Wt 228.5 lb

## 2016-01-18 DIAGNOSIS — C8589 Other specified types of non-Hodgkin lymphoma, extranodal and solid organ sites: Secondary | ICD-10-CM | POA: Diagnosis not present

## 2016-01-18 DIAGNOSIS — Z7901 Long term (current) use of anticoagulants: Secondary | ICD-10-CM

## 2016-01-18 DIAGNOSIS — Z95828 Presence of other vascular implants and grafts: Secondary | ICD-10-CM

## 2016-01-18 LAB — PROTIME-INR (CHCC SATELLITE)
INR: 1.2 — ABNORMAL LOW (ref 2.0–3.5)
Protime: 14.4 Seconds — ABNORMAL HIGH (ref 10.6–13.4)

## 2016-01-18 LAB — CMP (CANCER CENTER ONLY)
ALK PHOS: 111 U/L — AB (ref 26–84)
ALT: 19 U/L (ref 10–47)
AST: 31 U/L (ref 11–38)
Albumin: 3.4 g/dL (ref 3.3–5.5)
BUN, Bld: 15 mg/dL (ref 7–22)
CO2: 26 mEq/L (ref 18–33)
Calcium: 9.6 mg/dL (ref 8.0–10.3)
Chloride: 100 mEq/L (ref 98–108)
Creat: 0.9 mg/dl (ref 0.6–1.2)
GLUCOSE: 186 mg/dL — AB (ref 73–118)
POTASSIUM: 4.3 meq/L (ref 3.3–4.7)
Sodium: 136 mEq/L (ref 128–145)
Total Bilirubin: 0.7 mg/dl (ref 0.20–1.60)
Total Protein: 6.6 g/dL (ref 6.4–8.1)

## 2016-01-18 LAB — CBC WITH DIFFERENTIAL (CANCER CENTER ONLY)
BASO#: 0.1 10*3/uL (ref 0.0–0.2)
BASO%: 1.5 % (ref 0.0–2.0)
EOS%: 5.9 % (ref 0.0–7.0)
Eosinophils Absolute: 0.3 10*3/uL (ref 0.0–0.5)
HCT: 33.4 % — ABNORMAL LOW (ref 38.7–49.9)
HGB: 11.1 g/dL — ABNORMAL LOW (ref 13.0–17.1)
LYMPH#: 1.4 10*3/uL (ref 0.9–3.3)
LYMPH%: 30.6 % (ref 14.0–48.0)
MCH: 31.2 pg (ref 28.0–33.4)
MCHC: 33.2 g/dL (ref 32.0–35.9)
MCV: 94 fL (ref 82–98)
MONO#: 0.7 10*3/uL (ref 0.1–0.9)
MONO%: 14.8 % — AB (ref 0.0–13.0)
NEUT#: 2.1 10*3/uL (ref 1.5–6.5)
NEUT%: 47.2 % (ref 40.0–80.0)
PLATELETS: 170 10*3/uL (ref 145–400)
RBC: 3.56 10*6/uL — ABNORMAL LOW (ref 4.20–5.70)
RDW: 14.1 % (ref 11.1–15.7)
WBC: 4.5 10*3/uL (ref 4.0–10.0)

## 2016-01-18 LAB — LACTATE DEHYDROGENASE: LDH: 209 U/L (ref 125–245)

## 2016-01-18 MED ORDER — SODIUM CHLORIDE 0.9% FLUSH
10.0000 mL | INTRAVENOUS | Status: DC | PRN
  Administered 2016-01-18: 10 mL via INTRAVENOUS
  Filled 2016-01-18: qty 10

## 2016-01-18 MED ORDER — HEPARIN SOD (PORK) LOCK FLUSH 100 UNIT/ML IV SOLN
500.0000 [IU] | Freq: Once | INTRAVENOUS | Status: AC
  Administered 2016-01-18: 500 [IU] via INTRAVENOUS
  Filled 2016-01-18: qty 5

## 2016-01-18 NOTE — Telephone Encounter (Signed)
Dr. Marin Olp - THANK YOU VERY MUCH! You are awesome! I sincerely appreciate the assistance.

## 2016-01-18 NOTE — Telephone Encounter (Signed)
Villa Herb and Dr Elease Hashimoto are both out of the office today.   Dr. Marin Olp - would you be able to help with pt's coumadin dosing, please? Thanks!

## 2016-01-18 NOTE — Telephone Encounter (Signed)
Pt saw his cancer dr today and his coum was 1.2  Pt needs to know what to do, increase or decrease his warfarin.  Jenny Reichmann went home sick today, pt would like someone to call him with instructions.  thanks

## 2016-01-18 NOTE — Progress Notes (Signed)
Hematology and Oncology Follow Up Visit  Gary Yates [id number] May 17, 1933 83 y.o. 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 As all his, he does have a lot of health issues.  We did go ahead and do a PET scan on him. This was done right after Christmas. The PET scan did not show any active lymphoma. He basically got into remission after 2 cycles.  He says he is not eating much. He says he has a good appetite but just doesn't eat all that much. I'm not sure as to why he would have this issue. Again I don't think this be anything from his chemotherapy area did I did recommend him trying a PPI. I wrote down what to buy over-the-counter-Zegerid-to take once a day.  He says his blood sugars have been on the high side. This probably is from the holidays.  He says his back really hurts. He is going to have a MRI of the back next week or so. He may need to have another back surgery.  He seems to be going to the bathroom okay. He's had no nausea or vomiting.  He's had some leg swelling. He is on diuretics. The leg swelling seems to be a little bit better.  He says he goes to see his regular doctor next week.   Overall, I would say that his performance status is ECOG 1-2.  Medications:  Current Outpatient Prescriptions:  .  ACCU-CHEK AVIVA PLUS test strip, TEST 3 TIMES A DAY, Disp: 300 each, Rfl: 5 .  acetaminophen (TYLENOL) 500 MG tablet, Take 500 mg by mouth every 6 (six) hours as needed for mild pain., Disp: , Rfl:  .  atorvastatin (LIPITOR) 40 MG tablet, TAKE 1 TABLET BY MOUTH DAILY, Disp: 90 tablet, Rfl: 2 .  clonazePAM (KLONOPIN) 1 MG tablet, TAKE 1 TABLET EVERY DAY AT BEDTIME AS NEEDED, Disp: 30 tablet, Rfl: 5 .  clopidogrel (PLAVIX) 75 MG tablet, TAKE 1 TABLET BY MOUTH DAILY, Disp: 30 tablet, Rfl: 8 .  furosemide  (LASIX) 40 MG tablet, Take 1 tablet (40 mg total) by mouth daily., Disp: 30 tablet, Rfl: 1 .  gabapentin (NEURONTIN) 300 MG capsule, Take 2 capsules (600 mg total) by mouth 3 (three) times daily., Disp: 180 capsule, Rfl: 2 .  Insulin Glargine (TOUJEO SOLOSTAR) 300 UNIT/ML SOPN, Inject 20 Units into the skin daily after breakfast. , Disp: , Rfl:  .  insulin lispro (HUMALOG) 100 UNIT/ML injection, Take 1 times daily with meals as per sliding scale., Disp: , Rfl:  .  metFORMIN (GLUCOPHAGE) 500 MG tablet, TAKE 1 TABLET (500 MG TOTAL) BY MOUTH 2 (TWO) TIMES DAILY WITH A MEAL., Disp: 60 tablet, Rfl: 5 .  metoprolol succinate (TOPROL-XL) 100 MG 24 hr tablet, TAKE 1 TABLET EVERY DAY IMMEDIATELY FOLLOWING A MEAL, Disp: 90 tablet, Rfl: 1 .  nitroGLYCERIN (NITROSTAT) 0.4 MG SL tablet, Place 1 tablet (0.4 mg total) under the tongue every 5 (five) minutes as needed. Chest pain, Disp: 25 tablet, Rfl: 6 .  oxybutynin (DITROPAN XL) 15 MG 24 hr tablet, , Disp: , Rfl:  .  pantoprazole (PROTONIX) 40 MG tablet, TAKE 1 TABLET BY MOUTH EVERY DAY, Disp: 90 tablet, Rfl: 3 .  vitamin B-12 (CYANOCOBALAMIN) 1000 MCG tablet, Take 1 tablet (1,000 mcg total) by mouth  daily., Disp: 30 tablet, Rfl: 0 .  warfarin (COUMADIN) 5 MG tablet, Take as directed by anticoagulation clinic., Disp: 40 tablet, Rfl: 1 No current facility-administered medications for this visit.   Facility-Administered Medications Ordered in Other Visits:  .  testosterone cypionate (DEPOTESTOTERONE CYPIONATE) injection 200 mg, 200 mg, Intramuscular, Q28 days, [name], MD, 200 mg at 12/26/11 0850 .  testosterone cypionate (DEPOTESTOTERONE CYPIONATE) injection 200 mg, 200 mg, Intramuscular, Q28 days, [name], MD, 200 mg at 01/23/12 0908 .  testosterone cypionate (DEPOTESTOTERONE CYPIONATE) injection 200 mg, 200 mg, Intramuscular, Q28 days, [name], MD, 200 mg at 02/25/12 0848 .  testosterone cypionate (DEPOTESTOTERONE CYPIONATE)  injection 200 mg, 200 mg, Intramuscular, Q28 days, [name], MD, 200 mg at 06/03/12 1437  Allergies:  Allergies  Allergen Reactions  . Ace Inhibitors Other (See Comments)    cough  . Codeine Nausea Only and Rash       . Penicillins Rash    Childhood allergy Has patient had a PCN reaction causing immediate rash, facial/tongue/throat swelling, SOB or lightheadedness with hypotension: Yes Has patient had a PCN reaction causing severe rash involving mucus membranes or skin necrosis: Yes Has patient had a PCN reaction that required hospitalization No Has patient had a PCN reaction occurring within the last 10 years: No If all of the above answers are "NO", then may proceed with Cephalosporin use.     Past Medical History, Surgical history, Social history, and Family History were reviewed and updated.  Review of Systems:  As above  Physical Exam:  weight is 228 lb 8 oz (103.6 kg). His oral temperature is 98.2 F (36.8 C). His blood pressure is 127/64 and his pulse is 120 (abnormal).   Wt Readings from Last 3 Encounters:  01/18/16 228 lb 8 oz (103.6 kg)  12/19/15 230 lb (104.3 kg)  12/07/15 235 lb (106.6 kg)      Elderly white male. He started to lose his hair. Head and neck exam shows no ocular or oral lesions. There are no palpable cervical or supraclavicular lymph nodes. He has no oral thrush. There is no oral mucositis. There is no palpable adenopathy in the neck. Lungs are clear to percussion and auscultation bilaterally. Cardiac exam regular rate and rhythm consistent with atrial fibrillation. He has no murmurs, rubs or bruits. Abdomen is soft. He has decent bowel sounds. There is no fluid wave. There is no guarding or rebound tenderness. He has no palpable hepatosplenomegaly. He has a healed right inguinal orchiectomy scar. Extremities shows chronic 1+ edema in his legs. He has some stasis dermatitis changes in his legs. Neurological exam shows no focal neurological  deficits. Skin exam shows some brawny edema in his lower legs.  Lab Results  Component Value Date   WBC 4.5 01/18/2016   HGB 11.1 (L) 01/18/2016   HCT 33.4 (L) 01/18/2016   MCV 94 01/18/2016   PLT 170 01/18/2016     Chemistry      Component Value Date/Time   NA 136 01/18/2016 0743   NA 137 12/14/2015 1100   K 4.3 01/18/2016 0743   K 3.7 12/14/2015 1100   CL 100 01/18/2016 0743   CO2 26 01/18/2016 0743   CO2 24 12/14/2015 1100   BUN 15 01/18/2016 0743   BUN 18.4 12/14/2015 1100   CREATININE 0.9 01/18/2016 0743   CREATININE 0.9 12/14/2015 1100      Component Value Date/Time   CALCIUM 9.6 01/18/2016 0743   CALCIUM  9.5 12/14/2015 1100   ALKPHOS 111 (H) 01/18/2016 0743   ALKPHOS 102 12/14/2015 1100   AST 31 01/18/2016 0743   AST 22 12/14/2015 1100   ALT 19 01/18/2016 0743   ALT 10 12/14/2015 1100   BILITOT 0.70 01/18/2016 0743   BILITOT 0.58 12/14/2015 1100         Impression and Plan: Gary Yates is A 83 year old white male. He has testicular lymphoma. He is on systemic chemotherapy. He has a marginal performance status. As such, we can really cannot be all that aggressive with him.  I don't think he would be a great candidate for intrathecal chemotherapy. He would be a candidate for radiation therapy to the scrotal region.  I think that we can now move him over to radiation therapy to the scrotal region. I spoke with Dr. Sondra Come of radiation oncology. He will see Gary Yates.  I just do not think that Gary Yates is a candidate for intrathecal chemotherapy. He is on Coumadin. He has other health issues. I just think that trying to do intrathecal therapy would lead to additional problems for him.   His INR on his Coumadin was only 1.2. I told him to take 7.5 mg daily until Monday. He says he sees his doctor on Monday to have the Coumadin checked.   We can get him back to see [place] now in about 6 weeks. I think this would be reasonable. We will get his Port-A-Cath flushed we see him  back.    Volanda Napoleon, MD 1/11/20182:04 PM

## 2016-01-19 ENCOUNTER — Ambulatory Visit: Payer: Medicare Other

## 2016-01-19 ENCOUNTER — Ambulatory Visit (INDEPENDENT_AMBULATORY_CARE_PROVIDER_SITE_OTHER): Payer: Medicare Other | Admitting: General Practice

## 2016-01-19 ENCOUNTER — Telehealth: Payer: Self-pay | Admitting: General Practice

## 2016-01-19 NOTE — Progress Notes (Signed)
I have reviewed and agree with the plan. 

## 2016-01-19 NOTE — Patient Instructions (Signed)
Pre visit review using our clinic review tool, if applicable. No additional management support is needed unless otherwise documented below in the visit note. INR is low.  Dr. Marin Olp instructed patient to take 7.5 mg of coumadin Thursday through Sunday and check INR on Monday.  I spoke with patient today and he did not take 7.5 on Thursday, he only took 5 mg.  I asked him to please, please follow Dr. Antonieta Pert instructions.  Appointment made for Monday to check INR.  Patient is getting glucose and INR confused.  He is resistant to taking recommended dosage of Warfarin.

## 2016-01-19 NOTE — Telephone Encounter (Signed)
I spoke with patient this morning.  Patient states he took 5 mg of coumadin yesterday (1/11).  I communicated that Dr. Antonieta Pert note that instructed him to take 7.5 mg of warfarin Thursday through Sunday and to have INR checked on Monday.  Patient said he was "afraid" to take that much.  I reiterated the risks of clotting due to a sub-therapeutic INR.  Patient hesitated but said he would follow instructions.  Appointment made for Monday to have INR checked.  Patient is confused about checking his glucose versus checking his INR.  Although I have been increasing warfarin dosage patient's INR is continually sub-therapeutic.  I believe it is due to confusion.

## 2016-01-21 ENCOUNTER — Emergency Department (HOSPITAL_COMMUNITY): Payer: Medicare Other

## 2016-01-21 ENCOUNTER — Emergency Department (HOSPITAL_COMMUNITY)
Admission: EM | Admit: 2016-01-21 | Discharge: 2016-01-21 | Disposition: A | Discharge status: Home / Self Care | Payer: Medicare Other | Attending: Emergency Medicine | Admitting: Emergency Medicine

## 2016-01-21 ENCOUNTER — Other Ambulatory Visit: Payer: Self-pay

## 2016-01-21 ENCOUNTER — Encounter (HOSPITAL_COMMUNITY): Payer: Self-pay

## 2016-01-21 DIAGNOSIS — R791 Abnormal coagulation profile: Secondary | ICD-10-CM | POA: Diagnosis not present

## 2016-01-21 DIAGNOSIS — Z794 Long term (current) use of insulin: Secondary | ICD-10-CM | POA: Diagnosis not present

## 2016-01-21 DIAGNOSIS — E1122 Type 2 diabetes mellitus with diabetic chronic kidney disease: Secondary | ICD-10-CM | POA: Insufficient documentation

## 2016-01-21 DIAGNOSIS — Z8572 Personal history of non-Hodgkin lymphomas: Secondary | ICD-10-CM | POA: Diagnosis not present

## 2016-01-21 DIAGNOSIS — J449 Chronic obstructive pulmonary disease, unspecified: Secondary | ICD-10-CM | POA: Diagnosis not present

## 2016-01-21 DIAGNOSIS — E1140 Type 2 diabetes mellitus with diabetic neuropathy, unspecified: Secondary | ICD-10-CM | POA: Insufficient documentation

## 2016-01-21 DIAGNOSIS — I2510 Atherosclerotic heart disease of native coronary artery without angina pectoris: Secondary | ICD-10-CM | POA: Diagnosis not present

## 2016-01-21 DIAGNOSIS — Z7901 Long term (current) use of anticoagulants: Secondary | ICD-10-CM | POA: Insufficient documentation

## 2016-01-21 DIAGNOSIS — G629 Polyneuropathy, unspecified: Secondary | ICD-10-CM

## 2016-01-21 DIAGNOSIS — M25551 Pain in right hip: Secondary | ICD-10-CM | POA: Diagnosis not present

## 2016-01-21 DIAGNOSIS — R630 Anorexia: Secondary | ICD-10-CM | POA: Diagnosis not present

## 2016-01-21 DIAGNOSIS — I129 Hypertensive chronic kidney disease with stage 1 through stage 4 chronic kidney disease, or unspecified chronic kidney disease: Secondary | ICD-10-CM | POA: Diagnosis not present

## 2016-01-21 DIAGNOSIS — Z7902 Long term (current) use of antithrombotics/antiplatelets: Secondary | ICD-10-CM | POA: Insufficient documentation

## 2016-01-21 DIAGNOSIS — M79604 Pain in right leg: Secondary | ICD-10-CM | POA: Diagnosis not present

## 2016-01-21 DIAGNOSIS — N183 Chronic kidney disease, stage 3 (moderate): Secondary | ICD-10-CM | POA: Insufficient documentation

## 2016-01-21 DIAGNOSIS — Z87891 Personal history of nicotine dependence: Secondary | ICD-10-CM | POA: Insufficient documentation

## 2016-01-21 DIAGNOSIS — Z79899 Other long term (current) drug therapy: Secondary | ICD-10-CM | POA: Insufficient documentation

## 2016-01-21 DIAGNOSIS — Z955 Presence of coronary angioplasty implant and graft: Secondary | ICD-10-CM | POA: Diagnosis not present

## 2016-01-21 DIAGNOSIS — R531 Weakness: Secondary | ICD-10-CM | POA: Diagnosis not present

## 2016-01-21 LAB — CBC WITH DIFFERENTIAL/PLATELET
BASOS ABS: 0.1 10*3/uL (ref 0.0–0.1)
BASOS PCT: 1 %
EOS ABS: 0.2 10*3/uL (ref 0.0–0.7)
EOS PCT: 5 %
HCT: 33.1 % — ABNORMAL LOW (ref 39.0–52.0)
HEMOGLOBIN: 11.1 g/dL — AB (ref 13.0–17.0)
LYMPHS ABS: 1.4 10*3/uL (ref 0.7–4.0)
Lymphocytes Relative: 33 %
MCH: 30.7 pg (ref 26.0–34.0)
MCHC: 33.5 g/dL (ref 30.0–36.0)
MCV: 91.4 fL (ref 78.0–100.0)
Monocytes Absolute: 0.5 10*3/uL (ref 0.1–1.0)
Monocytes Relative: 12 %
NEUTROS PCT: 49 %
Neutro Abs: 2.1 10*3/uL (ref 1.7–7.7)
Platelets: 163 10*3/uL (ref 150–400)
RBC: 3.62 MIL/uL — AB (ref 4.22–5.81)
RDW: 13.7 % (ref 11.5–15.5)
WBC: 4.3 10*3/uL (ref 4.0–10.5)

## 2016-01-21 LAB — URINALYSIS, ROUTINE W REFLEX MICROSCOPIC
BACTERIA UA: NONE SEEN
BILIRUBIN URINE: NEGATIVE
Glucose, UA: 50 mg/dL — AB
Hgb urine dipstick: NEGATIVE
Ketones, ur: NEGATIVE mg/dL
NITRITE: NEGATIVE
PH: 7 (ref 5.0–8.0)
Protein, ur: NEGATIVE mg/dL
Specific Gravity, Urine: 1.011 (ref 1.005–1.030)

## 2016-01-21 LAB — COMPREHENSIVE METABOLIC PANEL
ALT: 12 U/L — AB (ref 17–63)
AST: 27 U/L (ref 15–41)
Albumin: 3.8 g/dL (ref 3.5–5.0)
Alkaline Phosphatase: 86 U/L (ref 38–126)
Anion gap: 8 (ref 5–15)
BILIRUBIN TOTAL: 0.6 mg/dL (ref 0.3–1.2)
BUN: 15 mg/dL (ref 6–20)
CALCIUM: 9.1 mg/dL (ref 8.9–10.3)
CO2: 24 mmol/L (ref 22–32)
CREATININE: 0.84 mg/dL (ref 0.61–1.24)
Chloride: 106 mmol/L (ref 101–111)
Glucose, Bld: 101 mg/dL — ABNORMAL HIGH (ref 65–99)
Potassium: 3.9 mmol/L (ref 3.5–5.1)
Sodium: 138 mmol/L (ref 135–145)
TOTAL PROTEIN: 6.9 g/dL (ref 6.5–8.1)

## 2016-01-21 LAB — PROTIME-INR
INR: 1.37
PROTHROMBIN TIME: 16.9 s — AB (ref 11.4–15.2)

## 2016-01-21 LAB — I-STAT TROPONIN, ED: Troponin i, poc: 0.03 ng/mL (ref 0.00–0.08)

## 2016-01-21 MED ORDER — THIAMINE HCL 100 MG/ML IJ SOLN
100.0000 mg | Freq: Once | INTRAMUSCULAR | Status: AC
  Administered 2016-01-21: 100 mg via INTRAVENOUS
  Filled 2016-01-21: qty 2

## 2016-01-21 MED ORDER — CYANOCOBALAMIN 1000 MCG/ML IJ SOLN
1000.0000 ug | Freq: Once | INTRAMUSCULAR | Status: AC
  Administered 2016-01-21: 1000 ug via INTRAMUSCULAR
  Filled 2016-01-21: qty 1

## 2016-01-21 MED ORDER — HYDROCODONE-ACETAMINOPHEN 5-325 MG PO TABS: 0.5000 | ORAL_TABLET | Freq: Four times a day (QID) | ORAL | 0 refills | Status: DC | PRN

## 2016-01-21 MED ORDER — DOCUSATE SODIUM 250 MG PO CAPS: 250.0000 mg | ORAL_CAPSULE | Freq: Every day | ORAL | 0 refills | Status: DC

## 2016-01-21 MED ORDER — ONDANSETRON 4 MG PO TBDP
4.0000 mg | ORAL_TABLET | Freq: Once | ORAL | Status: AC
  Administered 2016-01-21: 4 mg via ORAL
  Filled 2016-01-21: qty 1

## 2016-01-21 MED ORDER — ONDANSETRON HCL 4 MG PO TABS: 4.0000 mg | ORAL_TABLET | Freq: Three times a day (TID) | ORAL | 0 refills | Status: DC | PRN

## 2016-01-21 MED ORDER — SODIUM CHLORIDE 0.9 % IV BOLUS (SEPSIS)
1000.0000 mL | Freq: Once | INTRAVENOUS | Status: AC
  Administered 2016-01-21: 1000 mL via INTRAVENOUS

## 2016-01-21 MED ORDER — HYDROCODONE-ACETAMINOPHEN 5-325 MG PO TABS
2.0000 | ORAL_TABLET | Freq: Once | ORAL | Status: AC
  Administered 2016-01-21: 2 via ORAL
  Filled 2016-01-21: qty 2

## 2016-01-21 NOTE — Discharge Instructions (Signed)
SEEK IMMEDIATE MEDICAL CARE IF:  You cannot perform your normal daily activities, such as getting dressed and feeding yourself. You cannot walk up and down stairs, or you feel exhausted when you do so. You have shortness of breath or chest pain. You have difficulty moving parts of your body. You have weakness in only one area of the body or on only one side of the body. You have a fever. You have trouble speaking or swallowing. You cannot control your bladder or bowel movements. You have black or bloody vomit or stools. MAKE SURE YOU: Understand these instructions. Will watch your condition. Will get help right away if you are not doing well or get worse.

## 2016-01-21 NOTE — ED Notes (Signed)
D/c information reviewed with patient, wife, and son. All verbalized understanding of home instructions and prescriptions given.

## 2016-01-21 NOTE — ED Notes (Signed)
Patient transported to X-ray 

## 2016-01-21 NOTE — ED Notes (Signed)
Attempted orthostatics before assisting with urinal however pt needed bathroom assistance urgently Ginger-ale and graham crackers at bedside. Pt requesting a few minutes before orthostatics.

## 2016-01-21 NOTE — ED Provider Notes (Signed)
  Face-to-face evaluation   History: He presents for malaise, decreased appetite, increasing pain in his lower legs and right hip, with low back pain. Recently completed a course of chemotherapy for lymphoma.  Physical exam: Obese, elderly man. Mouth is very dry oral mucous membranes. Heart regular rate and rhythm, no murmur. Lungs clear. Patient had an soft, nontender. 3+ peripheral edema lower legs. He is able to elevate both legs off the stretcher  Medical screening examination/treatment/procedure(s) were conducted as a shared visit with non-physician practitioner(s) and myself.  I personally evaluated the patient during the encounter   Daleen Bo, MD 01/23/16 1003

## 2016-01-21 NOTE — ED Triage Notes (Signed)
Per [facility]. Pt reside at home. Full Code. Pt states he was Caner Free as last week. Pt states weakness from knees down "tingling sensation". Good CMS to lower ext. Pt able to stand and pivot to stretcher. NEG FOR STROKE. Pt denies CP/SOB. N/V/D and fever.

## 2016-01-21 NOTE — ED Provider Notes (Signed)
[facility] DEPT Provider Note   CSN: [id number] Arrival date & time: 01/21/16  1514     History   Chief Complaint Chief Complaint  Patient presents with  . Weakness    BIL  . Cancer    HPI Gary Murcia. is a very pleasant  83 y.o. male. With a pmh of diabetes , peripheral neuropathy, History of lumbar surgery, and lymphoma in remission as of Nov. 2017. Patient presents with weakness and worsening sxs of peripheral neuropathy. He has had about a 50 pound weight loss since beginning treatment for his cancer, but states that he has steadily declining since and getting his therapy. Patient complains of poor appetite with early satiety. He has extreme fatigue and his ambulatory status has progressively worsened. He developed some pain in the right knee and saw someone at [facility] He had an x-ray of the knee that was negative and was sent back to Dr. Sharmaine Base has neurosurgeon for reevaluation of his spine. He is scheduled for an MRI of the lumbar spine. This coming Friday. The patient denies any new neurologic symptoms. He does admit to feeling some depression over his current situation and feeling worried about his inability to perform his normal ADLs. He denies any urinary symptoms, fever, chills, chest pain, shortness of breath.  HPI  Past Medical History:  Diagnosis Date  . Anxiety   . Atrial fibrillation ([place])   . COPD (chronic obstructive pulmonary disease) ([place])    pt. denies  . Coronary artery disease    a. h/o Overlapping stents RCA;  b. 06/2011 Cath: patent stents, nonobs dzs, NL EF.  . Diabetic peripheral neuropathy ([place])   . Diffuse non-Hodgkin's lymphoma of testis ([place]) 09/28/2015  . DM (diabetes mellitus) ([place])    Type 2, peripheral neuropathy.  . Dyspnea    with exertion  . Dysrhythmia   . GERD (gastroesophageal reflux disease)   . History of bronchitis   . History of kidney stones   . Hyperlipidemia   . Hypertension   . Low testosterone   . Nephrolithiasis    . Osteoarthritis    shoulder  . Restless leg   . SVT (supraventricular tachycardia) ([place])   . Urinary frequency     Patient Active Problem List   Diagnosis Date Noted  . Diffuse non-Hodgkin's lymphoma of testis ([place]) 09/28/2015  . Lumbosacral spondylosis with radiculopathy 02/07/2015  . At high risk for falls 05/05/2014  . Encounter for therapeutic drug monitoring 03/15/2013  . Chest pain 02/03/2013  . Obesity (BMI 30-39.9) 09/21/2012  . Poorly controlled type 2 diabetes mellitus with neuropathy ([place]) 07/03/2012  . Chronic kidney disease, stage III (moderate) 07/03/2012  . Restless leg syndrome 12/04/2011  . Long term current use of anticoagulant therapy 07/15/2011  . Paroxysmal atrial fibrillation ([place]) 06/21/2011  . Upper extremity weakness 06/21/2011  . Midsternal chest pain 06/19/2011  . Coronary artery disease   . Hypertension   . Hyperlipidemia   . COPD (chronic obstructive pulmonary disease) ([place])   . GERD (gastroesophageal reflux disease)   . Peripheral neuropathy ([place])   . Dyslipidemia 03/27/2011  . Diabetic peripheral neuropathy ([place]) 12/17/2010  . Hypotestosteronism 05/07/2010  . LEG CRAMPS, IDIOPATHIC 08/14/2009  . Insomnia 08/14/2009  . BASAL CELL CARCINOMA, NOSE 08/14/2009  . OBESITY 08/02/2009  . EDEMA 04/18/2009  . CAROTID ARTERY DISEASE 01/04/2009  . HYPERTENSION, HEART CONTROLLED W/O ASSOC CHF 03/28/2008  . CAD, NATIVE VESSEL 03/28/2008  . G E R D 01/12/2007  . Morbid obesity (  [place]) 09/15/2006  . HYPERLIPIDEMIA 07/01/2006  . Essential hypertension 07/01/2006  . Coronary atherosclerosis 07/01/2006  . COPD 07/01/2006  . OSTEOARTHRITIS 07/01/2006    Past Surgical History:  Procedure Laterality Date  . [place]  . CARDIAC CATHETERIZATION  01/2013  . CHOLECYSTECTOMY    . COLONOSCOPY    . CORONARY ANGIOPLASTY  2004  . EYE SURGERY Bilateral    cataracts  . IR GENERIC HISTORICAL  10/05/2015   IR US GUIDE VASC ACCESS RIGHT 10/05/2015  Marybelle Killings, MD [facility]  . IR GENERIC HISTORICAL  10/05/2015   IR FLUORO GUIDE PORT INSERTION RIGHT 10/05/2015 Marybelle Killings, MD [facility]  . LEFT HEART CATHETERIZATION WITH CORONARY ANGIOGRAM N/A 06/18/2011   Procedure: LEFT HEART CATHETERIZATION WITH CORONARY ANGIOGRAM;  Surgeon: Peter M Martinique, MD;  Location: [facility] CATH LAB;  Service: Cardiovascular;  Laterality: N/A;  . LEFT HEART CATHETERIZATION WITH CORONARY ANGIOGRAM N/A 01/27/2013   Procedure: LEFT HEART CATHETERIZATION WITH CORONARY ANGIOGRAM;  Surgeon: Burnell Blanks, MD;  Location: [facility] CATH LAB;  Service: Cardiovascular;  Laterality: N/A;  . LUMBAR LAMINECTOMY/DECOMPRESSION MICRODISCECTOMY N/A 02/07/2015   Procedure: Lumbar three-Sacral one Decompression;  Surgeon: Kevan Ny Ditty, MD;  Location: [facility] NEURO ORS;  Service: Neurosurgery;  Laterality: N/A;  L3 to S1 Decompression  . ORCHIECTOMY Right 09/01/2015   Procedure: RIGHT ORCHIECTOMY;  Surgeon: Kathie Rhodes, MD;  Location: [facility] ORS;  Service: Urology;  Laterality: Right;  . ROTATOR CUFF REPAIR Left        Home Medications    Prior to Admission medications   Medication Sig Start Date End Date Taking? Authorizing Provider  ACCU-CHEK AVIVA PLUS test strip TEST 3 TIMES A DAY 10/27/15   [name], MD  acetaminophen (TYLENOL) 500 MG tablet Take 500 mg by mouth every 6 (six) hours as needed for mild pain.    Historical Provider, MD  atorvastatin (LIPITOR) 40 MG tablet TAKE 1 TABLET BY MOUTH DAILY 11/24/15   [name], MD  cefdinir (OMNICEF) 300 MG capsule Take 300 mg by mouth 2 (two) times daily. Started 1/7 x 15 days 01/14/16   Historical Provider, MD  clonazePAM (KLONOPIN) 1 MG tablet TAKE 1 TABLET EVERY DAY AT BEDTIME AS NEEDED 06/23/15   [name], MD  clopidogrel (PLAVIX) 75 MG tablet TAKE 1 TABLET BY MOUTH DAILY 11/10/15   Burnell Blanks, MD  diclofenac (VOLTAREN) 75 MG EC tablet Take 75 mg by mouth 2 (two) times daily. 01/10/16   Historical  Provider, MD  docusate sodium (COLACE) 250 MG capsule Take 1 capsule (250 mg total) by mouth daily. 01/21/16   Margarita Mail, PA-C  furosemide (LASIX) 40 MG tablet Take 1 tablet (40 mg total) by mouth daily. 11/23/15   Eliezer Bottom, NP  gabapentin (NEURONTIN) 300 MG capsule Take 2 capsules (600 mg total) by mouth 3 (three) times daily. 01/12/16   [name], MD  HYDROcodone-acetaminophen (NORCO) 5-325 MG tablet Take 0.5-2 tablets by mouth every 6 (six) hours as needed for moderate pain. 01/21/16   Margarita Mail, PA-C  Insulin Glargine (TOUJEO SOLOSTAR) 300 UNIT/ML SOPN Inject 20 Units into the skin daily after breakfast.     Historical Provider, MD  insulin lispro (HUMALOG) 100 UNIT/ML injection Take 1 times daily with meals as per sliding scale.    Historical Provider, MD  metFORMIN (GLUCOPHAGE) 500 MG tablet TAKE 1 TABLET (500 MG TOTAL) BY MOUTH 2 (TWO) TIMES DAILY WITH A MEAL. 08/18/15  [name], MD  metoprolol succinate (TOPROL-XL) 100 MG 24 hr tablet TAKE 1 TABLET EVERY DAY IMMEDIATELY FOLLOWING A MEAL 02/27/15   [name], MD  nitroGLYCERIN (NITROSTAT) 0.4 MG SL tablet Place 1 tablet (0.4 mg total) under the tongue every 5 (five) minutes as needed. Chest pain 01/20/14   Burnell Blanks, MD  ondansetron (ZOFRAN) 4 MG tablet Take 1 tablet (4 mg total) by mouth every 8 (eight) hours as needed for nausea or vomiting. 01/21/16   Margarita Mail, PA-C  oxybutynin (DITROPAN XL) 15 MG 24 hr tablet  03/27/15   Historical Provider, MD  pantoprazole (PROTONIX) 40 MG tablet TAKE 1 TABLET BY MOUTH EVERY DAY 12/11/15   [name], MD  tamsulosin (FLOMAX) 0.4 MG CAPS capsule Take 0.4 mg by mouth daily. 01/17/16   Historical Provider, MD  Trospium Chloride 60 MG CP24 Take 60 mg by mouth daily. 01/04/16   Historical Provider, MD  vitamin B-12 (CYANOCOBALAMIN) 1000 MCG tablet Take 1 tablet (1,000 mcg total) by mouth daily. 06/12/15   [name], MD  warfarin (COUMADIN) 5  MG tablet Take as directed by anticoagulation clinic. 01/04/16   [name], MD    Family History Family History  Problem Relation Age of Onset  . Alzheimer's disease Mother   . Heart disease Mother   . Heart disease Father   . Migraines Father   . Ulcers Father   . Coronary artery disease      Male 1st degree relative <50  . Coronary artery disease      male 1st degree relative <60  . Heart disease Sister   . Obesity Sister     Morbid  . Arthritis Sister   . Heart disease Brother   . Arthritis Brother   . Sleep apnea Son   . Obesity Son   . Migraines Daughter   . Thyroid disease Daughter     Social History Social History  Substance Use Topics  . Smoking status: Former Smoker    Packs/day: 1.50    Years: 20.00    Types: Cigarettes    Quit date: 04/05/1983  . Smokeless tobacco: Never Used  . Alcohol use No     Allergies   Ace inhibitors; Codeine; and Penicillins   Review of Systems Review of Systems Ten systems reviewed and are negative for acute change, except as noted in the HPI.    Physical Exam Updated Vital Signs BP 127/82 (BP Location: Left Arm)   Pulse 99   Temp 98.1 F (36.7 C) (Oral)   Resp 15   SpO2 97%   Physical Exam  Constitutional: He appears well-developed and well-nourished. No distress.  Tearful  HENT:  Head: Normocephalic and atraumatic.  Dry oral mucosa  Eyes: Conjunctivae are normal. No scleral icterus.  Neck: Normal range of motion. Neck supple.  Cardiovascular: Normal rate, regular rhythm and normal heart sounds.   Peripheral pulses intact. 2+ pitting edema in the bilateral lower extremities. Legs are warm and well perfused.  Pulmonary/Chest: Effort normal and breath sounds normal. No respiratory distress.  Abdominal: Soft. Bowel sounds are normal. He exhibits no distension. There is no tenderness.  Musculoskeletal: He exhibits no edema.  Minimal pain in the hip with internal and external rotation  Neurological: He  is alert.  1+ reflexes in patellar tendons 5 out of 5 strength of the lower and upper extremities  Skin: Skin is warm and dry. He is not diaphoretic.  Psychiatric: His behavior  is normal.  Nursing note and vitals reviewed.    ED Treatments / Results  Labs (all labs ordered are listed, but only abnormal results are displayed) Labs Reviewed  COMPREHENSIVE METABOLIC PANEL - Abnormal; Notable for the following:       Result Value   Glucose, Bld 101 (*)    ALT 12 (*)    All other components within normal limits  CBC WITH DIFFERENTIAL/PLATELET - Abnormal; Notable for the following:    RBC 3.62 (*)    Hemoglobin 11.1 (*)    HCT 33.1 (*)    All other components within normal limits  PROTIME-INR - Abnormal; Notable for the following:    Prothrombin Time 16.9 (*)    All other components within normal limits  URINALYSIS, ROUTINE W REFLEX MICROSCOPIC - Abnormal; Notable for the following:    Glucose, UA 50 (*)    Leukocytes, UA SMALL (*)    Squamous Epithelial / LPF 0-5 (*)    All other components within normal limits  VITAMIN B12  I-STAT TROPOININ, ED    EKG  EKG Interpretation None       Radiology Dg Hip Unilat W Or Wo Pelvis 2-3 Views Right  Result Date: 01/21/2016 CLINICAL DATA:  Pt c/o right hip pain x 3 wks s/p multiple falls with walker. Pt reports hx lspine surgery x 1 yr ago and difficulty walking since then. Pt denies any more recent injuries. EXAM: DG HIP (WITH OR WITHOUT PELVIS) 2-3V RIGHT COMPARISON:  None. FINDINGS: No fracture.  No bone lesion. Hip joints and are normally spaced and aligned. SI joints and symphysis pubis are normally spaced and aligned. There has been prior laminectomies at L3, L4-L5. Skeletal structures are demineralized. Soft tissues are unremarkable. IMPRESSION: 1. No fracture, dislocation or acute finding. Electronically Signed   By: Lajean Manes M.D.   On: 01/21/2016 18:20    Procedures Procedures (including critical care time)  Medications  Ordered in ED Medications  sodium chloride 0.9 % bolus 1,000 mL (0 mLs Intravenous Stopped 01/21/16 1935)  thiamine (B-1) injection 100 mg (100 mg Intravenous Given 01/21/16 1743)  cyanocobalamin ((VITAMIN B-12)) injection 1,000 mcg (1,000 mcg Intramuscular Given 01/21/16 1958)  HYDROcodone-acetaminophen (NORCO/VICODIN) 5-325 MG per tablet 2 tablet (2 tablets Oral Given 01/21/16 1957)  ondansetron (ZOFRAN-ODT) disintegrating tablet 4 mg (4 mg Oral Given 01/21/16 1957)     Initial Impression / Assessment and Plan / ED Course  I have reviewed the triage vital signs and the nursing notes.  Pertinent labs & imaging results that were available during my care of the patient were reviewed by me and considered in my medical decision making (see chart for details).  Clinical Course as of Jan 22 111  Sun Jan 21, 2016  1933 Reviewed [place] last narcotic on 11/16/2015 for tussionex 6 day supply  [AH]    Clinical Course User Index [AH] Margarita Mail, PA-C    Patient left without significant abnormality. I have discussed the case with the on-call oncologist, Dr. Irene Limbo. We have discussed some medication management changes, specifically potentially changing the patient from Neurontin. 2. Cymbalta. I believe the patient primarily is having significant pain. This is making him feel debilitated along with his lack of oral intake. Patient given a shot of B-12, levels are pending. Have given the patient thiamine and fluids. The patient will be given narcotic pain medicine. I have advised the patient follow up with his PCP this week. No evidence of significant weakness or myelopathy in the  lower extremities. Patient appears safe for discharge at this time  Final Clinical Impressions(s) / ED Diagnoses   Final diagnoses:  Pain of right lower extremity  Neuropathy (HCC)  Decreased appetite  Generalized weakness  Subtherapeutic international normalized ratio (INR)    New Prescriptions Discharge Medication List  as of 01/21/2016  7:35 PM    START taking these medications   Details  docusate sodium (COLACE) 250 MG capsule Take 1 capsule (250 mg total) by mouth daily., Starting Sun 01/21/2016, Print    HYDROcodone-acetaminophen (NORCO) 5-325 MG tablet Take 0.5-2 tablets by mouth every 6 (six) hours as needed for moderate pain., Starting Sun 01/21/2016, Print    ondansetron (ZOFRAN) 4 MG tablet Take 1 tablet (4 mg total) by mouth every 8 (eight) hours as needed for nausea or vomiting., Starting Sun 01/21/2016, Print         Margarita Mail, PA-C 01/22/16 0113    Daleen Bo, MD 01/23/16 1002

## 2016-01-22 ENCOUNTER — Ambulatory Visit (INDEPENDENT_AMBULATORY_CARE_PROVIDER_SITE_OTHER): Payer: Medicare Other | Admitting: General Practice

## 2016-01-22 ENCOUNTER — Ambulatory Visit: Payer: Medicare Other

## 2016-01-22 LAB — VITAMIN B12: VITAMIN B 12: 1038 pg/mL — AB (ref 180–914)

## 2016-01-22 NOTE — Patient Instructions (Signed)
Pre visit review using our clinic review tool, if applicable. No additional management support is needed unless otherwise documented below in the visit note. 

## 2016-01-24 NOTE — Progress Notes (Signed)
Lymphoma Location(s) / Histology: testicular lymphoma   Gary Yates. presented with swelling and pain in his right testicle in August 2017.  It was removed by Dr. Karsten Ro in 8/17.  Biopsies revealed:   09/01/15 Diagnosis Testis, tumor, right - DIFFUSE LARGE B-CELL LYMPHOMA. - SEE ONCOLOGY TABLE.  Past/Anticipated interventions by medical oncology, if any: Status post cycle #4 of R-CHOP   Weight changes, if any, over the past 6 months: has lost 60 lbs since 2016  Recurrent fevers, or drenching night sweats, if any: no  SAFETY ISSUES:  Prior radiation? no  Pacemaker/ICD? no  Possible current pregnancy? no  Is the patient on methotrexate? no  Current Complaints / other details:  Patient is here with his wife and daughter.  BP (!) 141/71 (BP Location: Left Arm, Patient Position: Sitting)   Pulse (!) 101   Temp 97.8 F (36.6 C) (Oral)   Ht 5\' 10"  (1.778 m)   Wt 225 lb 3.2 oz (102.2 kg)   SpO2 96%   BMI 32.31 kg/m    Wt Readings from Last 3 Encounters:  01/31/16 225 lb 3.2 oz (102.2 kg)  01/29/16 224 lb (101.6 kg)  01/18/16 228 lb 8 oz (103.6 kg)

## 2016-01-26 ENCOUNTER — Other Ambulatory Visit: Payer: Medicare Other

## 2016-01-29 ENCOUNTER — Ambulatory Visit (INDEPENDENT_AMBULATORY_CARE_PROVIDER_SITE_OTHER): Payer: Medicare Other | Admitting: General Practice

## 2016-01-29 ENCOUNTER — Ambulatory Visit (INDEPENDENT_AMBULATORY_CARE_PROVIDER_SITE_OTHER): Payer: Medicare Other | Admitting: Family Medicine

## 2016-01-29 ENCOUNTER — Encounter: Payer: Self-pay | Admitting: Family Medicine

## 2016-01-29 VITALS — BP 130/72 | HR 88 | Ht 70.0 in | Wt 224.0 lb

## 2016-01-29 DIAGNOSIS — Z79899 Other long term (current) drug therapy: Secondary | ICD-10-CM

## 2016-01-29 DIAGNOSIS — G2581 Restless legs syndrome: Secondary | ICD-10-CM

## 2016-01-29 DIAGNOSIS — E1165 Type 2 diabetes mellitus with hyperglycemia: Secondary | ICD-10-CM

## 2016-01-29 DIAGNOSIS — R531 Weakness: Secondary | ICD-10-CM | POA: Diagnosis not present

## 2016-01-29 DIAGNOSIS — I480 Paroxysmal atrial fibrillation: Secondary | ICD-10-CM | POA: Diagnosis not present

## 2016-01-29 DIAGNOSIS — E1140 Type 2 diabetes mellitus with diabetic neuropathy, unspecified: Secondary | ICD-10-CM | POA: Diagnosis not present

## 2016-01-29 LAB — POCT INR: INR: 1.5

## 2016-01-29 NOTE — Progress Notes (Signed)
Subjective:     Patient ID: Gary Appleyard., male   DOB: 01/19/1933, 83 y.o.   MRN: [id number]  HPI  Patient seen for follow-up regarding recent ER visit on 01/21/2016. Multiple chronic problems including history of obesity, atrial fibrillation, hypertension, CAD, COPD, GERD, type 2 diabetes, diabetic peripheral neuropathy, non-Hodgkin's lymphoma, chronic kidney disease, restless leg syndrome, dyslipidemia. He presented to ED with complaints of increased malaise, decreased appetite and increasing pain in his legs and low back. Recently completed course of chemotherapy for lymphoma. He also complained of very dry oral mucous membranes. Diabetes has been relatively stable. He had several labs done which were basically unremarkable. B12 levels were over thousand. Hemoglobin stable 11.1 with normal white count. He had only small leukocytes on urine dipstick. Diagnostic hip revealed no acute findings. Patient was given IV fluids and B12 injection and felt somewhat better after fluids. He was given limited hydrocodone pain medication. He was also prescribed diclofenac and Omnicef.  He continues have dry mouth. We reviewed his medications and he is uncertain of several medications but he has both [place] and oxybutynin listed on his meds. He is not sure but thinks he is taking both of these.  He has very long history of neuropathic pain which has been very challenging to control. He currently takes high-dose gabapentin. He is taken Cymbalta in the past apparently of no benefit. He states he has difficulty differentiating restless leg symptoms from neuropathy. Last A1c 7.8%.  Recent PET scan revealed no evidence for persistent hypermetabolic activity from his lymphoma.  Past Medical History:  Diagnosis Date  . Anxiety   . Atrial fibrillation ([place])   . COPD (chronic obstructive pulmonary disease) ([place])    pt. denies  . Coronary artery disease    a. h/o Overlapping stents RCA;  b. 06/2011 Cath: patent  stents, nonobs dzs, NL EF.  . Diabetic peripheral neuropathy ([place])   . Diffuse non-Hodgkin's lymphoma of testis ([place]) 09/28/2015  . DM (diabetes mellitus) ([place])    Type 2, peripheral neuropathy.  . Dyspnea    with exertion  . Dysrhythmia   . GERD (gastroesophageal reflux disease)   . History of bronchitis   . History of kidney stones   . Hyperlipidemia   . Hypertension   . Low testosterone   . Nephrolithiasis   . Osteoarthritis    shoulder  . Restless leg   . SVT (supraventricular tachycardia) ([place])   . Urinary frequency    Past Surgical History:  Procedure Laterality Date  . [place]  . CARDIAC CATHETERIZATION  01/2013  . CHOLECYSTECTOMY    . COLONOSCOPY    . CORONARY ANGIOPLASTY  2004  . EYE SURGERY Bilateral    cataracts  . IR GENERIC HISTORICAL  10/05/2015   IR US GUIDE VASC ACCESS RIGHT 10/05/2015 Marybelle Killings, MD [facility]  . IR GENERIC HISTORICAL  10/05/2015   IR FLUORO GUIDE PORT INSERTION RIGHT 10/05/2015 Marybelle Killings, MD [facility]  . LEFT HEART CATHETERIZATION WITH CORONARY ANGIOGRAM N/A 06/18/2011   Procedure: LEFT HEART CATHETERIZATION WITH CORONARY ANGIOGRAM;  Surgeon: Peter M Martinique, MD;  Location: [facility] CATH LAB;  Service: Cardiovascular;  Laterality: N/A;  . LEFT HEART CATHETERIZATION WITH CORONARY ANGIOGRAM N/A 01/27/2013   Procedure: LEFT HEART CATHETERIZATION WITH CORONARY ANGIOGRAM;  Surgeon: Burnell Blanks, MD;  Location: [facility] CATH LAB;  Service: Cardiovascular;  Laterality: N/A;  . LUMBAR LAMINECTOMY/DECOMPRESSION MICRODISCECTOMY N/A 02/07/2015   Procedure: Lumbar three-Sacral one Decompression;  Surgeon:  Kevan Ny Ditty, MD;  Location: [facility] NEURO ORS;  Service: Neurosurgery;  Laterality: N/A;  L3 to S1 Decompression  . ORCHIECTOMY Right 09/01/2015   Procedure: RIGHT ORCHIECTOMY;  Surgeon: Kathie Rhodes, MD;  Location: [facility] ORS;  Service: Urology;  Laterality: Right;  . ROTATOR CUFF REPAIR Left     reports that he quit smoking about 32  years ago. His smoking use included Cigarettes. He has a 30.00 pack-year smoking history. He has never used smokeless tobacco. He reports that he does not drink alcohol or use drugs. family history includes Alzheimer's disease in his mother; Arthritis in his brother and sister; Heart disease in his brother, father, mother, and sister; Migraines in his daughter and father; Obesity in his sister and son; Sleep apnea in his son; Thyroid disease in his daughter; Ulcers in his father. Allergies  Allergen Reactions  . Ace Inhibitors Other (See Comments)    cough  . Codeine Nausea Only and Rash       . Penicillins Rash    Childhood allergy Has patient had a PCN reaction causing immediate rash, facial/tongue/throat swelling, SOB or lightheadedness with hypotension: Yes Has patient had a PCN reaction causing severe rash involving mucus membranes or skin necrosis: Yes Has patient had a PCN reaction that required hospitalization No Has patient had a PCN reaction occurring within the last 10 years: No If all of the above answers are "NO", then may proceed with Cephalosporin use.     Review of Systems  Constitutional: Positive for fatigue. Negative for chills and fever.  Respiratory: Negative for cough and shortness of breath.   Cardiovascular: Negative for chest pain and palpitations.  Gastrointestinal: Negative for abdominal pain.  Genitourinary: Negative for dysuria.  Musculoskeletal: Positive for back pain.  Neurological: Positive for weakness. Negative for dizziness.  Psychiatric/Behavioral: Positive for sleep disturbance. Negative for confusion.       Objective:   Physical Exam  Constitutional: He is oriented to person, place, and time. He appears well-developed and well-nourished.  Neck: Neck supple.  Cardiovascular: Normal rate.   Pulmonary/Chest: Effort normal and breath sounds normal. No respiratory distress. He has no wheezes. He has no rales.  Musculoskeletal:  Trace nonpitting  edema legs bilaterally  Neurological: He is alert and oriented to person, place, and time.       Assessment:     #1 recent generalized weakness. Etiology unclear. Certainly could be related to polypharmacy.  He apparently also had some dehydration.  #2 polypharmacy. Patient is not sure of several of his medications. He has duplicity in terms of both Sanctura and oxybutynin on med list  #3 type 2 diabetes. Recent improving control  #4 chronic peripheral neuropathy pain  #5 restless leg syndrome  #6 history of atrial fibrillation on chronic Coumadin  #7 history of non-Hodgkin's lymphoma      Plan:     -talked at length about simplifying medications. We recommended he stop [place]. We'll try to hold oxybutynin but if he has severe urine urgency start back.  Anti-cholinergic effects likely contributing to dry mouth and ?weakness. -Discontinue diclofenac. Would have concerns with using this with his chronic kidney disease and history of diabetes as well as tendency toward fluid retention -We discussed possible reduction in gabapentin. We do not have a clear record but think he has tried Cymbalta in the past. Would be a consideration of trying Cymbalta again with concomitant reduction gabapentin gradually over time -Recheck A1c at follow-up -bring all current medications at follow up. -over  40 minutes spent with this visit of which > 50% in direct assessment and counseling of patient regarding medications, diabetes management, diet,   [name] MD [facility]

## 2016-01-29 NOTE — Progress Notes (Signed)
Pre visit review using our clinic review tool, if applicable. No additional management support is needed unless otherwise documented below in the visit note. 

## 2016-01-29 NOTE — Patient Instructions (Signed)
Stop Diclofenac Hold Oxybutynin and Trospium for now.

## 2016-01-29 NOTE — Patient Instructions (Addendum)
Pre visit review using our clinic review tool, if applicable. No additional management support is needed unless otherwise documented below in the visit note. INR is low today.  I boosted patient today and then increased dosage.  Will re-check in 10 days to 2 weeks.  Patient denies missing doses or any medication changes.

## 2016-01-31 ENCOUNTER — Encounter: Payer: Self-pay | Admitting: Radiation Oncology

## 2016-01-31 ENCOUNTER — Ambulatory Visit
Admission: RE | Admit: 2016-01-31 | Discharge: 2016-01-31 | Disposition: A | Discharge status: Home / Self Care | Payer: Medicare Other | Source: Ambulatory Visit | Attending: Radiation Oncology | Admitting: Radiation Oncology

## 2016-01-31 VITALS — BP 141/71 | HR 101 | Temp 97.8°F | Ht 70.0 in | Wt 225.2 lb

## 2016-01-31 DIAGNOSIS — C8589 Other specified types of non-Hodgkin lymphoma, extranodal and solid organ sites: Secondary | ICD-10-CM

## 2016-01-31 DIAGNOSIS — Z794 Long term (current) use of insulin: Secondary | ICD-10-CM | POA: Insufficient documentation

## 2016-01-31 DIAGNOSIS — Z7901 Long term (current) use of anticoagulants: Secondary | ICD-10-CM | POA: Diagnosis not present

## 2016-01-31 DIAGNOSIS — F419 Anxiety disorder, unspecified: Secondary | ICD-10-CM | POA: Diagnosis not present

## 2016-01-31 DIAGNOSIS — J449 Chronic obstructive pulmonary disease, unspecified: Secondary | ICD-10-CM | POA: Insufficient documentation

## 2016-01-31 DIAGNOSIS — G2581 Restless legs syndrome: Secondary | ICD-10-CM | POA: Insufficient documentation

## 2016-01-31 DIAGNOSIS — K219 Gastro-esophageal reflux disease without esophagitis: Secondary | ICD-10-CM | POA: Diagnosis not present

## 2016-01-31 DIAGNOSIS — Z87891 Personal history of nicotine dependence: Secondary | ICD-10-CM | POA: Insufficient documentation

## 2016-01-31 DIAGNOSIS — I10 Essential (primary) hypertension: Secondary | ICD-10-CM | POA: Diagnosis not present

## 2016-01-31 DIAGNOSIS — C8585 Other specified types of non-Hodgkin lymphoma, lymph nodes of inguinal region and lower limb: Secondary | ICD-10-CM | POA: Insufficient documentation

## 2016-01-31 DIAGNOSIS — I471 Supraventricular tachycardia: Secondary | ICD-10-CM | POA: Diagnosis not present

## 2016-01-31 DIAGNOSIS — E785 Hyperlipidemia, unspecified: Secondary | ICD-10-CM | POA: Insufficient documentation

## 2016-01-31 DIAGNOSIS — I2510 Atherosclerotic heart disease of native coronary artery without angina pectoris: Secondary | ICD-10-CM | POA: Insufficient documentation

## 2016-01-31 DIAGNOSIS — E1142 Type 2 diabetes mellitus with diabetic polyneuropathy: Secondary | ICD-10-CM | POA: Insufficient documentation

## 2016-01-31 DIAGNOSIS — I4891 Unspecified atrial fibrillation: Secondary | ICD-10-CM | POA: Insufficient documentation

## 2016-01-31 DIAGNOSIS — R350 Frequency of micturition: Secondary | ICD-10-CM | POA: Insufficient documentation

## 2016-01-31 DIAGNOSIS — Z87442 Personal history of urinary calculi: Secondary | ICD-10-CM | POA: Diagnosis not present

## 2016-01-31 DIAGNOSIS — Z86718 Personal history of other venous thrombosis and embolism: Secondary | ICD-10-CM | POA: Diagnosis not present

## 2016-01-31 DIAGNOSIS — M1990 Unspecified osteoarthritis, unspecified site: Secondary | ICD-10-CM | POA: Insufficient documentation

## 2016-01-31 NOTE — Progress Notes (Signed)
Please see the Nurse Progress Note in the MD Initial Consult Encounter for this patient. 

## 2016-01-31 NOTE — Progress Notes (Signed)
Radiation Oncology         [phone]) [phone] ________________________________  Initial Outpatient Consultation  Name: Gary Yates. MRN: [id number]  Date: 01/31/2016  DOB: 1933/03/20  VZ:CHYIFOYDX,AJOIN W, MD  Volanda Napoleon, MD   REFERRING PHYSICIAN: Volanda Napoleon, MD  DIAGNOSIS: The encounter diagnosis was Diffuse non-Hodgkin's lymphoma of testis [facility]).  HISTORY OF PRESENT ILLNESS::Gary Yates. is a 83 y.o. male who presented with swelling and pain in his right testicle in August 2017. He was referred to Dr. Karsten Ro of urology for a suspected hydrocele. Dr. Karsten Ro proceeded to perform a right orchiectomy on 09/01/15. 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 PET scan on 11/03/15 showed focal activity in the right scrotum as well as a 1.5 cm hypermetabolic left adrenal nodule. The patient has since been seen by Dr. Marin Olp of oncology. The patient is status post cycle #4 of R-CHOP as of 12/07/15.  A restaging PET scan was performed on 12/27/15. This showed no significant abnormal hypermetabolic activity suggestive of active lymphoma. There was a focus of high activity along a contracted portion of the splenic flexure of the colon.  There was mild left adrenal fullness in the vicinity of the previously described metastatic lesion, measuring up to 9 mm in thickness without hypermetabolic activity.  Patient notes a 60 pound weight loss since 2016. He reports a poor appetite as a result of the chemotherapy. He denies fevers or night sweats. He reports brief, occasional double vision while watching television but no other time. He will meet with his ophthalmologist later this month. He denies headaches. Patient reports difficulty sleeping.  PREVIOUS RADIATION THERAPY: No  PAST MEDICAL HISTORY:  has a past medical history of Anxiety; Atrial fibrillation ([place]); COPD (chronic obstructive pulmonary disease) ([place]); Coronary artery disease;  Diabetic peripheral neuropathy ([place]); Diffuse non-Hodgkin's lymphoma of testis ([place]) (09/28/2015); DM (diabetes mellitus) ([place]); Dyspnea; Dysrhythmia; GERD (gastroesophageal reflux disease); History of bronchitis; History of kidney stones; Hyperlipidemia; Hypertension; Low testosterone; Nephrolithiasis; Osteoarthritis; Restless leg; SVT (supraventricular tachycardia) ([place]); and Urinary frequency.    PAST SURGICAL HISTORY: Past Surgical History:  Procedure Laterality Date  . [place]  . CARDIAC CATHETERIZATION  01/2013  . CHOLECYSTECTOMY    . COLONOSCOPY    . CORONARY ANGIOPLASTY  2004  . EYE SURGERY Bilateral    cataracts  . IR GENERIC HISTORICAL  10/05/2015   IR US GUIDE VASC ACCESS RIGHT 10/05/2015 Marybelle Killings, MD [facility]INTER[facility] RAD  . IR GENERIC HISTORICAL  10/05/2015   IR FLUORO GUIDE PORT INSERTION RIGHT 10/05/2015 Marybelle Killings, MD [facility]  . LEFT HEART CATHETERIZATION WITH CORONARY ANGIOGRAM N/A 06/18/2011   Procedure: LEFT HEART CATHETERIZATION WITH CORONARY ANGIOGRAM;  Surgeon: Peter M Martinique, MD;  Location: [facility] CATH LAB;  Service: Cardiovascular;  Laterality: N/A;  . LEFT HEART CATHETERIZATION WITH CORONARY ANGIOGRAM N/A 01/27/2013   Procedure: LEFT HEART CATHETERIZATION WITH CORONARY ANGIOGRAM;  Surgeon: Burnell Blanks, MD;  Location: [facility] CATH LAB;  Service: Cardiovascular;  Laterality: N/A;  . LUMBAR LAMINECTOMY/DECOMPRESSION MICRODISCECTOMY N/A 02/07/2015   Procedure: Lumbar three-Sacral one Decompression;  Surgeon: Kevan Ny Ditty, MD;  Location: [place] NEURO ORS;  Service: Neurosurgery;  Laterality: N/A;  L3 to S1 Decompression  . ORCHIECTOMY Right 09/01/2015   Procedure: RIGHT ORCHIECTOMY;  Surgeon: Kathie Rhodes, MD;  Location: [facility] ORS;  Service: Urology;  Laterality: Right;  . ROTATOR CUFF REPAIR Left     FAMILY HISTORY: family history includes Alzheimer's disease  in his mother; Arthritis in his brother and sister; Heart disease in his brother, father, mother,  and sister; Migraines in his daughter and father; Obesity in his sister and son; Prostate cancer in his brother; Sleep apnea in his son; Thyroid disease in his daughter; Ulcers in his father.  SOCIAL HISTORY:  reports that he quit smoking about 37 years ago. His smoking use included Cigarettes. He has a 30.00 pack-year smoking history. He has never used smokeless tobacco. He reports that he does not drink alcohol or use drugs.  ALLERGIES: Ace inhibitors; Codeine; and Penicillins  MEDICATIONS:  Current Outpatient Prescriptions  Medication Sig Dispense Refill  . ACCU-CHEK AVIVA PLUS test strip TEST 3 TIMES A DAY 300 each 5  . acetaminophen (TYLENOL) 500 MG tablet Take 500 mg by mouth every 6 (six) hours as needed for mild pain.    Marland Kitchen atorvastatin (LIPITOR) 40 MG tablet TAKE 1 TABLET BY MOUTH DAILY 90 tablet 2  . cefdinir (OMNICEF) 300 MG capsule Take 300 mg by mouth 2 (two) times daily. Started 1/7 x 15 days    . clonazePAM (KLONOPIN) 1 MG tablet TAKE 1 TABLET EVERY DAY AT BEDTIME AS NEEDED 30 tablet 5  . clopidogrel (PLAVIX) 75 MG tablet TAKE 1 TABLET BY MOUTH DAILY 30 tablet 8  . docusate sodium (COLACE) 250 MG capsule Take 1 capsule (250 mg total) by mouth daily. 10 capsule 0  . furosemide (LASIX) 40 MG tablet Take 1 tablet (40 mg total) by mouth daily. 30 tablet 1  . gabapentin (NEURONTIN) 300 MG capsule Take 2 capsules (600 mg total) by mouth 3 (three) times daily. 180 capsule 2  . HYDROcodone-acetaminophen (NORCO) 5-325 MG tablet Take 0.5-2 tablets by mouth every 6 (six) hours as needed for moderate pain. 20 tablet 0  . Insulin Glargine (TOUJEO SOLOSTAR) 300 UNIT/ML SOPN Inject 20 Units into the skin daily after breakfast.     . insulin lispro (HUMALOG) 100 UNIT/ML injection Take 1 times daily with meals as per sliding scale.    . metFORMIN (GLUCOPHAGE) 500 MG tablet TAKE 1 TABLET (500 MG TOTAL) BY MOUTH 2 (TWO) TIMES DAILY WITH A MEAL. 60 tablet 5  . metoprolol succinate (TOPROL-XL) 100 MG  24 hr tablet TAKE 1 TABLET EVERY DAY IMMEDIATELY FOLLOWING A MEAL 90 tablet 1  . nitroGLYCERIN (NITROSTAT) 0.4 MG SL tablet Place 1 tablet (0.4 mg total) under the tongue every 5 (five) minutes as needed. Chest pain 25 tablet 6  . tamsulosin (FLOMAX) 0.4 MG CAPS capsule Take 0.4 mg by mouth daily.    . vitamin B-12 (CYANOCOBALAMIN) 1000 MCG tablet Take 1 tablet (1,000 mcg total) by mouth daily. 30 tablet 0  . warfarin (COUMADIN) 5 MG tablet Take as directed by anticoagulation clinic. 40 tablet 1  . ondansetron (ZOFRAN) 4 MG tablet Take 1 tablet (4 mg total) by mouth every 8 (eight) hours as needed for nausea or vomiting. (Patient not taking: Reported on 01/31/2016) 10 tablet 0  . pantoprazole (PROTONIX) 40 MG tablet TAKE 1 TABLET BY MOUTH EVERY DAY (Patient not taking: Reported on 01/31/2016) 90 tablet 3   No current facility-administered medications for this encounter.    Facility-Administered Medications Ordered in Other Encounters  Medication Dose Route Frequency Provider Last Rate Last Dose  . testosterone cypionate (DEPOTESTOTERONE CYPIONATE) injection 200 mg  200 mg Intramuscular Q28 days [name], MD   200 mg at 12/26/11 0850  . testosterone cypionate (DEPOTESTOTERONE CYPIONATE) injection 200 mg  200 mg Intramuscular  Q28 days [name], MD   200 mg at 01/23/12 0908  . testosterone cypionate (DEPOTESTOTERONE CYPIONATE) injection 200 mg  200 mg Intramuscular Q28 days [name], MD   200 mg at 02/25/12 0848  . testosterone cypionate (DEPOTESTOTERONE CYPIONATE) injection 200 mg  200 mg Intramuscular Q28 days [name], MD   200 mg at 06/03/12 1437    REVIEW OF SYSTEMS:  A 12 point review of systems is documented in the electronic medical record. This was obtained by the nursing staff. However, I reviewed this with the patient to discuss relevant findings and make appropriate changes.  Pertinent findings as noted in the history of present illness   PHYSICAL EXAM:   height is 5\' 10"  (1.778 m) and weight is 225 lb 3.2 oz (102.2 kg). His oral temperature is 97.8 F (36.6 C). His blood pressure is 141/71 (abnormal) and his pulse is 101 (abnormal). His oxygen saturation is 96%.   The patient is ambulatory with a walker. General: Alert and oriented, in no acute distress HEENT: Head is normocephalic. Extraocular movements are intact. Oropharynx is clear. Neck: Neck is supple, no palpable cervical or supraclavicular lymphadenopathy. Heart: Regular in rate and rhythm with no murmurs, rubs, or gallops. Chest: Clear to auscultation bilaterally, with no rhonchi, wheezes, or rales. Abdomen: Soft, nontender, nondistended, with no rigidity or guarding. Extremities: No cyanosis. Edema noted in bilateral ankles. Lymphatics: see Neck Exam, some edema noted in the bilateral lower extremities Skin: No concerning lesions. Musculoskeletal: symmetric strength and muscle tone throughout.  Neurologic: Cranial nerves II through XII are grossly intact. No obvious focalities. Speech is fluent. Coordination is intact. Psychiatric: Judgment and insight are intact. Affect is appropriate. Scrotum: right testicle is surgically absent, left testicle is soft without masses. Patient has a faint scar in right inguinal area from past hernia surgery.   ECOG = 1  LABORATORY DATA:  Lab Results  Component Value Date   WBC 4.3 01/21/2016   HGB 11.1 (L) 01/21/2016   HCT 33.1 (L) 01/21/2016   MCV 91.4 01/21/2016   PLT 163 01/21/2016   NEUTROABS 2.1 01/21/2016   Lab Results  Component Value Date   NA 138 01/21/2016   K 3.9 01/21/2016   CL 106 01/21/2016   CO2 24 01/21/2016   GLUCOSE 101 (H) 01/21/2016   CREATININE 0.84 01/21/2016   CALCIUM 9.1 01/21/2016      RADIOGRAPHY: Dg Hip Unilat W Or Wo Pelvis 2-3 Views Right  Result Date: 01/21/2016 CLINICAL DATA:  Pt c/o right hip pain x 3 wks s/p multiple falls with walker. Pt reports hx lspine surgery x 1 yr ago and difficulty walking  since then. Pt denies any more recent injuries. EXAM: DG HIP (WITH OR WITHOUT PELVIS) 2-3V RIGHT COMPARISON:  None. FINDINGS: No fracture.  No bone lesion. Hip joints and are normally spaced and aligned. SI joints and symphysis pubis are normally spaced and aligned. There has been prior laminectomies at L3, L4-L5. Skeletal structures are demineralized. Soft tissues are unremarkable. IMPRESSION: 1. No fracture, dislocation or acute finding. Electronically Signed   By: Lajean Manes M.D.   On: 01/21/2016 18:20      IMPRESSION: High grade non-hodgkin's lymphoma of the testis. Patient has completed his adjuvant chemotherapy. Given his diagnosis, he is at risk for relapse within the left testicle and I would recommend radiation therapy to the scrotal area. Anticipate 4 weeks of radiation therapy. I discussed the course of treatment, side effects, and potential toxicities  with the patient, his wife, and his daughter. He appears to understand and wishes to proceed with the treatment. A consent form was signed and a copy was placed in the patient's chart.  PLAN: CT scanning and treatment planning will be scheduled for 02/06/16 at 9 am. Anticipate treatment to begin approximately 1 week later.   I spent 60 minutes minutes face to face with the patient and more than 50% of that time was spent in counseling and/or coordination of care.   ------------------------------------------------  Blair Promise, PhD, MD  This document serves as a record of services personally performed by [name], MD. It was created on his behalf by Bethann Humble, a trained medical scribe. The creation of this record is based on the scribe's personal observations and the provider's statements to them. This document has been checked and approved by the attending provider.

## 2016-02-05 ENCOUNTER — Ambulatory Visit
Admission: RE | Admit: 2016-02-05 | Discharge: 2016-02-05 | Disposition: A | Discharge status: Home / Self Care | Payer: Medicare Other | Source: Ambulatory Visit | Attending: Neurological Surgery | Admitting: Neurological Surgery

## 2016-02-05 DIAGNOSIS — M4727 Other spondylosis with radiculopathy, lumbosacral region: Secondary | ICD-10-CM

## 2016-02-05 MED ORDER — GADOBENATE DIMEGLUMINE 529 MG/ML IV SOLN
20.0000 mL | Freq: Once | INTRAVENOUS | Status: AC | PRN
  Administered 2016-02-05: 20 mL via INTRAVENOUS

## 2016-02-06 ENCOUNTER — Ambulatory Visit
Admission: RE | Admit: 2016-02-06 | Discharge: 2016-02-06 | Disposition: A | Discharge status: Home / Self Care | Payer: Medicare Other | Source: Ambulatory Visit | Attending: Radiation Oncology | Admitting: Radiation Oncology

## 2016-02-06 DIAGNOSIS — C8585 Other specified types of non-Hodgkin lymphoma, lymph nodes of inguinal region and lower limb: Secondary | ICD-10-CM | POA: Diagnosis not present

## 2016-02-06 DIAGNOSIS — C8589 Other specified types of non-Hodgkin lymphoma, extranodal and solid organ sites: Secondary | ICD-10-CM

## 2016-02-08 ENCOUNTER — Ambulatory Visit: Payer: Medicare Other

## 2016-02-08 ENCOUNTER — Other Ambulatory Visit: Payer: Medicare Other

## 2016-02-08 ENCOUNTER — Ambulatory Visit: Payer: Medicare Other | Admitting: Hematology & Oncology

## 2016-02-11 NOTE — Progress Notes (Signed)
  Radiation Oncology         [phone]) [phone] ________________________________  Name: Gary Yates. MRN: [id number]  Date: 02/06/2016  DOB: May 04, 1933  SIMULATION AND TREATMENT PLANNING NOTE    ICD-9-CM ICD-10-CM   1. Diffuse non-Hodgkin's lymphoma of testis (HCC) 202.80 C85.89     DIAGNOSIS:  Diffuse large B-cell lymphoma of the right testis  NARRATIVE:  The patient was brought to the [place].  Identity was confirmed.  All relevant records and images related to the planned course of therapy were reviewed.  The patient freely provided informed written consent to proceed with treatment after reviewing the details related to the planned course of therapy. The consent form was witnessed and verified by the simulation staff.  Then, the patient was set-up in a stable reproducible  supine position for radiation therapy.  CT images were obtained.  Surface markings were placed.  The CT images were loaded into the planning software.  Then the target and avoidance structures were contoured.  Treatment planning then occurred.  The radiation prescription was entered and confirmed.  Then, I designed and supervised the construction of a total of 5 medically necessary complex treatment devices.  I have requested : 3D Simulation  I have requested a DVH of the following structures: CTV, PTV, fem head/neck, bladder and rectum.  I have ordered:dose calc.  PLAN:  The patient will receive 32.4 Gy in 18 fractions.  -----------------------------------  Blair Promise, PhD, MD

## 2016-02-12 ENCOUNTER — Ambulatory Visit (INDEPENDENT_AMBULATORY_CARE_PROVIDER_SITE_OTHER): Payer: Medicare Other | Admitting: General Practice

## 2016-02-12 DIAGNOSIS — I480 Paroxysmal atrial fibrillation: Secondary | ICD-10-CM | POA: Diagnosis not present

## 2016-02-12 LAB — POCT INR: INR: 2

## 2016-02-12 NOTE — Patient Instructions (Signed)
Pre visit review using our clinic review tool, if applicable. No additional management support is needed unless otherwise documented below in the visit note. 

## 2016-02-13 DIAGNOSIS — C8585 Other specified types of non-Hodgkin lymphoma, lymph nodes of inguinal region and lower limb: Secondary | ICD-10-CM | POA: Diagnosis not present

## 2016-02-14 ENCOUNTER — Ambulatory Visit: Payer: Medicare Other | Admitting: Radiation Oncology

## 2016-02-15 ENCOUNTER — Ambulatory Visit: Payer: Medicare Other

## 2016-02-16 ENCOUNTER — Ambulatory Visit: Payer: Medicare Other

## 2016-02-19 ENCOUNTER — Ambulatory Visit
Admission: RE | Admit: 2016-02-19 | Discharge: 2016-02-19 | Disposition: A | Discharge status: Home / Self Care | Payer: Medicare Other | Source: Ambulatory Visit | Attending: Radiation Oncology | Admitting: Radiation Oncology

## 2016-02-19 ENCOUNTER — Ambulatory Visit: Payer: Medicare Other

## 2016-02-19 DIAGNOSIS — C8585 Other specified types of non-Hodgkin lymphoma, lymph nodes of inguinal region and lower limb: Secondary | ICD-10-CM | POA: Diagnosis not present

## 2016-02-19 DIAGNOSIS — C8589 Other specified types of non-Hodgkin lymphoma, extranodal and solid organ sites: Secondary | ICD-10-CM

## 2016-02-19 NOTE — Progress Notes (Signed)
  Radiation Oncology         [phone]) [phone] ________________________________  Name: Gary Yates. MRN: [id number]  Date: 02/19/2016  DOB: 09-15-1933  Simulation Verification Note    ICD-9-CM ICD-10-CM   1. Diffuse non-Hodgkin's lymphoma of testis ([place]) 202.80 C85.89     Status: outpatient  NARRATIVE: The patient was brought to the treatment unit and placed in the planned treatment position. The clinical setup was verified. Then port films were obtained and uploaded to the radiation oncology medical record software.  The treatment beams were carefully compared against the planned radiation fields. The position location and shape of the radiation fields was reviewed. They targeted volume of tissue appears to be appropriately covered by the radiation beams. Organs at risk appear to be excluded as planned.  Based on my personal review, I approved the simulation verification. The patient's treatment will proceed as planned.  -----------------------------------  Blair Promise, PhD, MD

## 2016-02-20 ENCOUNTER — Encounter: Payer: Self-pay | Admitting: Radiation Oncology

## 2016-02-20 ENCOUNTER — Ambulatory Visit
Admission: RE | Admit: 2016-02-20 | Discharge: 2016-02-20 | Disposition: A | Discharge status: Home / Self Care | Payer: Medicare Other | Source: Ambulatory Visit | Attending: Radiation Oncology | Admitting: Radiation Oncology

## 2016-02-20 VITALS — BP 133/74 | HR 100 | Temp 98.3°F | Ht 70.0 in | Wt 234.2 lb

## 2016-02-20 DIAGNOSIS — C8585 Other specified types of non-Hodgkin lymphoma, lymph nodes of inguinal region and lower limb: Secondary | ICD-10-CM | POA: Diagnosis not present

## 2016-02-20 DIAGNOSIS — C8589 Other specified types of non-Hodgkin lymphoma, extranodal and solid organ sites: Secondary | ICD-10-CM

## 2016-02-20 NOTE — Progress Notes (Signed)
  Radiation Oncology         [phone]) [phone] ________________________________  Name: Gary Yates. MRN: [id number]  Date: 02/20/2016  DOB: 06/23/1933  Weekly Radiation Therapy Management    ICD-9-CM ICD-10-CM   1. Diffuse non-Hodgkin's lymphoma of testis (HCC) 202.80 C85.89      Current Dose: 3.6 Gy     Planned Dose:  32.4 Gy  Narrative . . . . . . . . The patient presents for routine under treatment assessment.                                   He denies pain or any bowel/bladder issues. He reports edema in both legs that he said started with chemotherapy. He reports being easily fatigued.                                  Set-up films were reviewed.                                 The chart was checked. Physical Findings. . .  height is 5\' 10"  (1.778 m) and weight is 234 lb 3.2 oz (106.2 kg). His oral temperature is 98.3 F (36.8 C). His blood pressure is 133/74 and his pulse is 100. His oxygen saturation is 99%. . Weight essentially stable.  Lungs are clear to auscultation bilaterally. Heart has regular rate and rhythm. Impression . . . . . . . The patient is tolerating radiation. Plan . . . . . . . . . . . . Continue treatment as planned.  ________________________________   Blair Promise, PhD, MD  This document serves as a record of services personally performed by [name], MD. It was created on his behalf by Darcus Austin, a trained medical scribe. The creation of this record is based on the scribe's personal observations and the provider's statements to them. This document has been checked and approved by the attending provider.

## 2016-02-20 NOTE — Progress Notes (Signed)
Pt here for patient teaching.  Pt given Radiation and You booklet. Reviewed areas of pertinence such as diarrhea, fatigue, nausea and vomiting, skin changes and urinary and bladder changes . Pt able to give teach back of to pat skin, use unscented/gentle soap, have Imodium on hand and drink plenty of water,avoid applying anything to skin within 4 hours of treatment. Pt demonstrated understanding and verbalizes understanding of information given and will contact nursing with any questions or concerns.          

## 2016-02-20 NOTE — Progress Notes (Signed)
Gary Yates has completed 2 fractions to his testis/scrotum.  He denies having pain.  He does have edema in both legs that he said started with chemotherapy.  He denies having any bowel/bladder issues.  He reports getting tired easily.  BP 133/74 (BP Location: Right Arm, Patient Position: Sitting)   Pulse 100   Temp 98.3 F (36.8 C) (Oral)   Ht 5\' 10"  (1.778 m)   Wt 234 lb 3.2 oz (106.2 kg)   SpO2 99%   BMI 33.60 kg/m    Wt Readings from Last 3 Encounters:  02/20/16 234 lb 3.2 oz (106.2 kg)  01/31/16 225 lb 3.2 oz (102.2 kg)  01/29/16 224 lb (101.6 kg)

## 2016-02-21 ENCOUNTER — Ambulatory Visit
Admission: RE | Admit: 2016-02-21 | Discharge: 2016-02-21 | Disposition: A | Discharge status: Home / Self Care | Payer: Medicare Other | Source: Ambulatory Visit | Attending: Radiation Oncology | Admitting: Radiation Oncology

## 2016-02-21 ENCOUNTER — Telehealth: Payer: Self-pay | Admitting: *Deleted

## 2016-02-21 DIAGNOSIS — C8585 Other specified types of non-Hodgkin lymphoma, lymph nodes of inguinal region and lower limb: Secondary | ICD-10-CM | POA: Diagnosis not present

## 2016-02-21 NOTE — Telephone Encounter (Signed)
Dr. Marin Olp sent a surgical clearance letter from Dr. Suezanne Jacquet Ditty regarding holding patients Plavix and coumadin.  Dr. Marin Olp defers this to patients cardiologist since he is the one prescribing this.  VM left for Dr. Desiree Lucy nurse as well as fax.

## 2016-02-22 ENCOUNTER — Ambulatory Visit
Admission: RE | Admit: 2016-02-22 | Discharge: 2016-02-22 | Disposition: A | Discharge status: Home / Self Care | Payer: Medicare Other | Source: Ambulatory Visit | Attending: Radiation Oncology | Admitting: Radiation Oncology

## 2016-02-22 DIAGNOSIS — C8585 Other specified types of non-Hodgkin lymphoma, lymph nodes of inguinal region and lower limb: Secondary | ICD-10-CM | POA: Diagnosis not present

## 2016-02-23 ENCOUNTER — Telehealth: Payer: Self-pay | Admitting: *Deleted

## 2016-02-23 ENCOUNTER — Ambulatory Visit
Admission: RE | Admit: 2016-02-23 | Discharge: 2016-02-23 | Disposition: A | Discharge status: Home / Self Care | Payer: Medicare Other | Source: Ambulatory Visit | Attending: Radiation Oncology | Admitting: Radiation Oncology

## 2016-02-23 DIAGNOSIS — C8585 Other specified types of non-Hodgkin lymphoma, lymph nodes of inguinal region and lower limb: Secondary | ICD-10-CM | POA: Diagnosis not present

## 2016-02-23 NOTE — Telephone Encounter (Signed)
Received surgical clearance request from Dr. Hewitt Shorts office. Gary Yates is scheduled to see Dr. Angelena Form on 3/16.  I placed call to Gary Yates to see when surgery is planned.  I spoke with his wife and she will call me back.

## 2016-02-23 NOTE — Telephone Encounter (Signed)
I spoke with pt. He will keep scheduled appt on 3/16 with Dr. Angelena Form and call me if he needs sooner clearance appt.

## 2016-02-26 ENCOUNTER — Telehealth: Payer: Self-pay | Admitting: Oncology

## 2016-02-26 ENCOUNTER — Ambulatory Visit
Admission: RE | Admit: 2016-02-26 | Discharge: 2016-02-26 | Disposition: A | Discharge status: Home / Self Care | Payer: Medicare Other | Source: Ambulatory Visit | Attending: Radiation Oncology | Admitting: Radiation Oncology

## 2016-02-26 ENCOUNTER — Ambulatory Visit: Payer: Medicare Other

## 2016-02-26 DIAGNOSIS — C8585 Other specified types of non-Hodgkin lymphoma, lymph nodes of inguinal region and lower limb: Secondary | ICD-10-CM | POA: Diagnosis not present

## 2016-02-26 NOTE — Telephone Encounter (Signed)
Left a message with Janett Billow, Surgery Scheduler with Dr. Cyndy Freeze regarding the medical release form for Mr. Abdalla upcoming surgery.  Advised her that the release will need to be signed by Mr. Roots primary care MD.

## 2016-02-27 ENCOUNTER — Ambulatory Visit
Admission: RE | Admit: 2016-02-27 | Discharge: 2016-02-27 | Disposition: A | Discharge status: Home / Self Care | Payer: Medicare Other | Source: Ambulatory Visit | Attending: Radiation Oncology | Admitting: Radiation Oncology

## 2016-02-27 ENCOUNTER — Other Ambulatory Visit: Payer: Self-pay | Admitting: Family Medicine

## 2016-02-27 ENCOUNTER — Encounter: Payer: Self-pay | Admitting: Radiation Oncology

## 2016-02-27 ENCOUNTER — Other Ambulatory Visit: Payer: Self-pay | Admitting: Family

## 2016-02-27 VITALS — BP 133/69 | HR 95 | Temp 98.7°F | Resp 18 | Wt 236.8 lb

## 2016-02-27 DIAGNOSIS — R600 Localized edema: Secondary | ICD-10-CM

## 2016-02-27 DIAGNOSIS — C8585 Other specified types of non-Hodgkin lymphoma, lymph nodes of inguinal region and lower limb: Secondary | ICD-10-CM | POA: Diagnosis not present

## 2016-02-27 DIAGNOSIS — C8589 Other specified types of non-Hodgkin lymphoma, extranodal and solid organ sites: Secondary | ICD-10-CM

## 2016-02-27 NOTE — Progress Notes (Signed)
  Radiation Oncology         [phone]) [phone] ________________________________  Name: Gary Yates. MRN: [id number]  Date: 02/27/2016  DOB: 24-Dec-1933  Weekly Radiation Therapy Management    ICD-9-CM ICD-10-CM   1. Diffuse non-Hodgkin's lymphoma of testis (HCC) 202.80 C85.89      Current Dose: 12.6 Gy     Planned Dose:  32.4 Gy  Narrative . . . . . . . . The patient presents for routine under treatment assessment.                                   Gary Yates completed 7th fraction to testis/scrotum today.  Denies pain or redness/swelling. He reports some hematuria. Complains of occasional constipation associated with narcotic use and the nurse instructed use of Miralax or laxative stool softeners. Complains of fatigue. He reports having a skin biopsy of his right face last week with the results pending.                                  Set-up films were reviewed.                                 The chart was checked. Physical Findings. . .  weight is 236 lb 12.8 oz (107.4 kg). His oral temperature is 98.7 F (37.1 C). His blood pressure is 133/69 and his pulse is 95. His respiration is 18 and oxygen saturation is 98%. . Weight essentially stable.  Lungs are clear to auscultation bilaterally. Heart has regular rate and rhythm. Ambulatory with a walker. Impression . . . . . . . The patient is tolerating radiation. Plan . . . . . . . . . . . . Continue treatment as planned.  ________________________________   Blair Promise, PhD, MD  This document serves as a record of services personally performed by [name], MD. It was created on his behalf by Darcus Austin, a trained medical scribe. The creation of this record is based on the scribe's personal observations and the provider's statements to them. This document has been checked and approved by the attending provider.

## 2016-02-27 NOTE — Progress Notes (Signed)
Gary Yates completed 7th fraction to testis/scrotum today.  Denies any pain.  Denies any redness/swelling.  Complains of occassionally constipation associated with narcotic use and instructed to use Miralax or laxative stool softeners.  Complains of fatigue that appears to be associated with radiation.  Denies any issues with urination frequency and stream.  Reports having skin biopsy's to face removed last week, pending results.     Vitals:   02/27/16 1154  BP: 133/69  Pulse: 95  Resp: 18  Temp: 98.7 F (37.1 C)  TempSrc: Oral  SpO2: 98%  Weight: 236 lb 12.8 oz (107.4 kg)   Wt Readings from Last 3 Encounters:  02/27/16 236 lb 12.8 oz (107.4 kg)  02/20/16 234 lb 3.2 oz (106.2 kg)  01/31/16 225 lb 3.2 oz (102.2 kg)

## 2016-02-28 ENCOUNTER — Ambulatory Visit
Admission: RE | Admit: 2016-02-28 | Discharge: 2016-02-28 | Disposition: A | Discharge status: Home / Self Care | Payer: Medicare Other | Source: Ambulatory Visit | Attending: Radiation Oncology | Admitting: Radiation Oncology

## 2016-02-28 ENCOUNTER — Ambulatory Visit: Payer: Medicare Other | Admitting: Cardiovascular Disease

## 2016-02-28 DIAGNOSIS — C8585 Other specified types of non-Hodgkin lymphoma, lymph nodes of inguinal region and lower limb: Secondary | ICD-10-CM | POA: Diagnosis not present

## 2016-02-29 ENCOUNTER — Ambulatory Visit (HOSPITAL_BASED_OUTPATIENT_CLINIC_OR_DEPARTMENT_OTHER): Payer: Medicare Other | Admitting: Hematology & Oncology

## 2016-02-29 ENCOUNTER — Ambulatory Visit
Admission: RE | Admit: 2016-02-29 | Discharge: 2016-02-29 | Disposition: A | Discharge status: Home / Self Care | Payer: Medicare Other | Source: Ambulatory Visit | Attending: Radiation Oncology | Admitting: Radiation Oncology

## 2016-02-29 ENCOUNTER — Ambulatory Visit (HOSPITAL_BASED_OUTPATIENT_CLINIC_OR_DEPARTMENT_OTHER): Payer: Medicare Other

## 2016-02-29 ENCOUNTER — Other Ambulatory Visit (HOSPITAL_BASED_OUTPATIENT_CLINIC_OR_DEPARTMENT_OTHER): Payer: Medicare Other

## 2016-02-29 VITALS — BP 128/65 | HR 86 | Temp 98.5°F | Wt 240.0 lb

## 2016-02-29 DIAGNOSIS — Z95828 Presence of other vascular implants and grafts: Secondary | ICD-10-CM

## 2016-02-29 DIAGNOSIS — Z7901 Long term (current) use of anticoagulants: Secondary | ICD-10-CM

## 2016-02-29 DIAGNOSIS — C8339 Diffuse large B-cell lymphoma, extranodal and solid organ sites: Secondary | ICD-10-CM

## 2016-02-29 DIAGNOSIS — C8585 Other specified types of non-Hodgkin lymphoma, lymph nodes of inguinal region and lower limb: Secondary | ICD-10-CM | POA: Diagnosis not present

## 2016-02-29 DIAGNOSIS — C8589 Other specified types of non-Hodgkin lymphoma, extranodal and solid organ sites: Secondary | ICD-10-CM

## 2016-02-29 LAB — CMP (CANCER CENTER ONLY)
ALT(SGPT): 14 U/L (ref 10–47)
AST: 22 U/L (ref 11–38)
Albumin: 3.3 g/dL (ref 3.3–5.5)
Alkaline Phosphatase: 85 U/L — ABNORMAL HIGH (ref 26–84)
BILIRUBIN TOTAL: 0.5 mg/dL (ref 0.20–1.60)
BUN, Bld: 12 mg/dL (ref 7–22)
CALCIUM: 9.5 mg/dL (ref 8.0–10.3)
CO2: 28 meq/L (ref 18–33)
Chloride: 100 mEq/L (ref 98–108)
Creat: 1 mg/dl (ref 0.6–1.2)
GLUCOSE: 146 mg/dL — AB (ref 73–118)
Potassium: 3.5 mEq/L (ref 3.3–4.7)
Sodium: 138 mEq/L (ref 128–145)
Total Protein: 6.1 g/dL — ABNORMAL LOW (ref 6.4–8.1)

## 2016-02-29 LAB — CBC WITH DIFFERENTIAL (CANCER CENTER ONLY)
BASO#: 0 10*3/uL (ref 0.0–0.2)
BASO%: 0.5 % (ref 0.0–2.0)
EOS%: 2.8 % (ref 0.0–7.0)
Eosinophils Absolute: 0.2 10*3/uL (ref 0.0–0.5)
HEMATOCRIT: 33.8 % — AB (ref 38.7–49.9)
HGB: 11.3 g/dL — ABNORMAL LOW (ref 13.0–17.1)
LYMPH#: 1.5 10*3/uL (ref 0.9–3.3)
LYMPH%: 25.6 % (ref 14.0–48.0)
MCH: 30.9 pg (ref 28.0–33.4)
MCHC: 33.4 g/dL (ref 32.0–35.9)
MCV: 92 fL (ref 82–98)
MONO#: 0.7 10*3/uL (ref 0.1–0.9)
MONO%: 11.7 % (ref 0.0–13.0)
NEUT#: 3.4 10*3/uL (ref 1.5–6.5)
NEUT%: 59.4 % (ref 40.0–80.0)
Platelets: 184 10*3/uL (ref 145–400)
RBC: 3.66 10*6/uL — ABNORMAL LOW (ref 4.20–5.70)
RDW: 12.9 % (ref 11.1–15.7)
WBC: 5.7 10*3/uL (ref 4.0–10.0)

## 2016-02-29 LAB — PROTIME-INR (CHCC SATELLITE)
INR: 2 (ref 2.0–3.5)
Protime: 24 Seconds — ABNORMAL HIGH (ref 10.6–13.4)

## 2016-02-29 LAB — LACTATE DEHYDROGENASE: LDH: 195 U/L (ref 125–245)

## 2016-02-29 MED ORDER — SODIUM CHLORIDE 0.9% FLUSH
10.0000 mL | INTRAVENOUS | Status: DC | PRN
  Administered 2016-02-29: 10 mL via INTRAVENOUS
  Filled 2016-02-29: qty 10

## 2016-02-29 MED ORDER — HEPARIN SOD (PORK) LOCK FLUSH 100 UNIT/ML IV SOLN
500.0000 [IU] | Freq: Once | INTRAVENOUS | Status: AC
  Administered 2016-02-29: 500 [IU] via INTRAVENOUS
  Filled 2016-02-29: qty 5

## 2016-02-29 NOTE — Progress Notes (Signed)
Hematology and Oncology Follow Up Visit  Gary Yates [id number] 21-Mar-1933 83 y.o. 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 This is probably the best that I have seen him look in a while. He is doing radiation. He is halfway through his radiation. He does get a little bit tired when he has radiation.  Unfortunately, the news is that he is going need back surgery. He has a lot of back issues. I don't see any problems from an oncologic point of view with him having back surgery. I'm sure cardiology will stay on top of this.  His appetite is marginal. He's not lost weight. In fact, he's gained 12 pounds since we saw him. He says he just does not eat much. I just have a large time believing this.  He is not coughing. I think he is having some reflux in the past. He has been taking some antacid.  He's had no fever. He's had no problems with bowels or bladder. He's had no rashes. He does have chronic leg swelling. .   Overall, I would say that his performance status is ECOG 1-2.  Medications:  Current Outpatient Prescriptions:  .  ACCU-CHEK AVIVA PLUS test strip, TEST 3 TIMES A DAY, Disp: 300 each, Rfl: 5 .  acetaminophen (TYLENOL) 500 MG tablet, Take 500 mg by mouth every 6 (six) hours as needed for mild pain., Disp: , Rfl:  .  atorvastatin (LIPITOR) 40 MG tablet, TAKE 1 TABLET BY MOUTH DAILY, Disp: 90 tablet, Rfl: 2 .  clonazePAM (KLONOPIN) 1 MG tablet, TAKE 1 TABLET EVERY DAY AT BEDTIME AS NEEDED, Disp: 30 tablet, Rfl: 5 .  clopidogrel (PLAVIX) 75 MG tablet, TAKE 1 TABLET BY MOUTH DAILY, Disp: 30 tablet, Rfl: 8 .  docusate sodium (COLACE) 250 MG capsule, Take 1 capsule (250 mg total) by mouth daily., Disp: 10 capsule, Rfl: 0 .  furosemide (LASIX) 40 MG tablet, TAKE 1 TABLET BY  MOUTH EVERY DAY, Disp: 30 tablet, Rfl: 1 .  gabapentin (NEURONTIN) 300 MG capsule, Take 2 capsules (600 mg total) by mouth 3 (three) times daily., Disp: 180 capsule, Rfl: 2 .  Insulin Glargine (TOUJEO SOLOSTAR) 300 UNIT/ML SOPN, Inject 20 Units into the skin daily after breakfast. , Disp: , Rfl:  .  insulin lispro (HUMALOG) 100 UNIT/ML injection, Take 1 times daily with meals as per sliding scale., Disp: , Rfl:  .  metFORMIN (GLUCOPHAGE) 500 MG tablet, TAKE 1 TABLET (500 MG TOTAL) BY MOUTH 2 (TWO) TIMES DAILY WITH A MEAL., Disp: 60 tablet, Rfl: 5 .  metoprolol succinate (TOPROL-XL) 100 MG 24 hr tablet, TAKE 1 TABLET EVERY DAY IMMEDIATELY FOLLOWING A MEAL, Disp: 90 tablet, Rfl: 1 .  nitroGLYCERIN (NITROSTAT) 0.4 MG SL tablet, Place 1 tablet (0.4 mg total) under the tongue every 5 (five) minutes as needed. Chest pain, Disp: 25 tablet, Rfl: 6 .  ondansetron (ZOFRAN) 4 MG tablet, Take 1 tablet (4 mg total) by mouth every 8 (eight) hours as needed for nausea or vomiting., Disp: 10 tablet, Rfl: 0 .  oxybutynin (DITROPAN XL) 15 MG 24 hr tablet, Take 15 mg by mouth every morning., Disp: , Rfl:  .  pantoprazole (PROTONIX) 40 MG tablet, TAKE 1 TABLET BY MOUTH EVERY DAY, Disp: 90 tablet,  Rfl: 3 .  tamsulosin (FLOMAX) 0.4 MG CAPS capsule, Take 0.4 mg by mouth daily., Disp: , Rfl:  .  vitamin B-12 (CYANOCOBALAMIN) 1000 MCG tablet, Take 1 tablet (1,000 mcg total) by mouth daily., Disp: 30 tablet, Rfl: 0 .  warfarin (COUMADIN) 5 MG tablet, TAKE AS DIRECTED, Disp: 40 tablet, Rfl: 1 No current facility-administered medications for this visit.   Facility-Administered Medications Ordered in Other Visits:  .  testosterone cypionate (DEPOTESTOTERONE CYPIONATE) injection 200 mg, 200 mg, Intramuscular, Q28 days, [name], MD, 200 mg at 12/26/11 0850 .  testosterone cypionate (DEPOTESTOTERONE CYPIONATE) injection 200 mg, 200 mg, Intramuscular, Q28 days, [name], MD, 200 mg at 01/23/12 0908 .   testosterone cypionate (DEPOTESTOTERONE CYPIONATE) injection 200 mg, 200 mg, Intramuscular, Q28 days, [name], MD, 200 mg at 02/25/12 0848 .  testosterone cypionate (DEPOTESTOTERONE CYPIONATE) injection 200 mg, 200 mg, Intramuscular, Q28 days, [name], MD, 200 mg at 06/03/12 1437  Allergies:  Allergies  Allergen Reactions  . Ace Inhibitors Other (See Comments)    cough  . Codeine Nausea Only and Rash       . Penicillins Rash    Childhood allergy Has patient had a PCN reaction causing immediate rash, facial/tongue/throat swelling, SOB or lightheadedness with hypotension: Yes Has patient had a PCN reaction causing severe rash involving mucus membranes or skin necrosis: Yes Has patient had a PCN reaction that required hospitalization No Has patient had a PCN reaction occurring within the last 10 years: No If all of the above answers are "NO", then may proceed with Cephalosporin use.     Past Medical History, Surgical history, Social history, and Family History were reviewed and updated.  Review of Systems:  As above  Physical Exam:  weight is 240 lb (108.9 kg). His oral temperature is 98.5 F (36.9 C). His blood pressure is 128/65 and his pulse is 86.   Wt Readings from Last 3 Encounters:  02/29/16 240 lb (108.9 kg)  02/27/16 236 lb 12.8 oz (107.4 kg)  02/20/16 234 lb 3.2 oz (106.2 kg)      Elderly white male. He started to lose his hair. Head and neck exam shows no ocular or oral lesions. There are no palpable cervical or supraclavicular lymph nodes. He has no oral thrush. There is no oral mucositis. There is no palpable adenopathy in the neck. Lungs are clear to percussion and auscultation bilaterally. Cardiac exam regular rate and rhythm consistent with atrial fibrillation. He has no murmurs, rubs or bruits. Abdomen is soft. He has decent bowel sounds. There is no fluid wave. There is no guarding or rebound tenderness. He has no palpable hepatosplenomegaly. He  has a healed right inguinal orchiectomy scar. Extremities shows chronic 1+ edema in his legs. He has some stasis dermatitis changes in his legs. Neurological exam shows no focal neurological deficits. Skin exam shows some brawny edema in his lower legs.  Lab Results  Component Value Date   WBC 5.7 02/29/2016   HGB 11.3 (L) 02/29/2016   HCT 33.8 (L) 02/29/2016   MCV 92 02/29/2016   PLT 184 02/29/2016     Chemistry      Component Value Date/Time   NA 138 02/29/2016 0931   NA 137 12/14/2015 1100   K 3.5 02/29/2016 0931   K 3.7 12/14/2015 1100   CL 100 02/29/2016 0931   CO2 28 02/29/2016 0931   CO2 24 12/14/2015 1100   BUN 12 02/29/2016 0931   BUN  18.4 12/14/2015 1100   CREATININE 1.0 02/29/2016 0931   CREATININE 0.9 12/14/2015 1100      Component Value Date/Time   CALCIUM 9.5 02/29/2016 0931   CALCIUM 9.5 12/14/2015 1100   ALKPHOS 85 (H) 02/29/2016 0931   ALKPHOS 102 12/14/2015 1100   AST 22 02/29/2016 0931   AST 22 12/14/2015 1100   ALT 14 02/29/2016 0931   ALT 10 12/14/2015 1100   BILITOT 0.50 02/29/2016 0931   BILITOT 0.58 12/14/2015 1100         Impression and Plan: Gary Yates is A 83 year old white male. He has testicular lymphoma. He is on systemic chemotherapy. He has a marginal performance status. As such, we can really cannot be all that aggressive with him.  I don't think he would be a great candidate for intrathecal chemotherapy.   I'm glad that is happy through his radiation therapy. He is doing well with this.   It sounds like he needs back surgery. I'm not sure when this will be done. From my point of view, I don't think there should be any problems with him having back surgery. I think any issues that he has live anymore cardiac or possibly coagulation issues from his Coumadin. I think the cardiologist are following this.   I will like to repeat a PET scan on him in 2 months. He will finish his radiation in 2 weeks. I want to make sure that he still is in  remission. I would think that he would be.   I am just glad that he looked so good. He really has come around nicely from chemotherapy.  I will see him back a week after his PET scan is done. We will make sure that he has his Port-A-Cath flushed    Volanda Napoleon, MD 2/22/20183:16 PM

## 2016-03-01 ENCOUNTER — Ambulatory Visit
Admission: RE | Admit: 2016-03-01 | Discharge: 2016-03-01 | Disposition: A | Discharge status: Home / Self Care | Payer: Medicare Other | Source: Ambulatory Visit | Attending: Radiation Oncology | Admitting: Radiation Oncology

## 2016-03-01 ENCOUNTER — Encounter: Payer: Self-pay | Admitting: Family Medicine

## 2016-03-01 ENCOUNTER — Ambulatory Visit (INDEPENDENT_AMBULATORY_CARE_PROVIDER_SITE_OTHER): Payer: Medicare Other | Admitting: Family Medicine

## 2016-03-01 VITALS — BP 122/60 | HR 89 | Ht 70.0 in | Wt 238.0 lb

## 2016-03-01 DIAGNOSIS — R600 Localized edema: Secondary | ICD-10-CM | POA: Diagnosis not present

## 2016-03-01 DIAGNOSIS — I10 Essential (primary) hypertension: Secondary | ICD-10-CM

## 2016-03-01 DIAGNOSIS — E1140 Type 2 diabetes mellitus with diabetic neuropathy, unspecified: Secondary | ICD-10-CM

## 2016-03-01 DIAGNOSIS — E1165 Type 2 diabetes mellitus with hyperglycemia: Secondary | ICD-10-CM

## 2016-03-01 DIAGNOSIS — C8585 Other specified types of non-Hodgkin lymphoma, lymph nodes of inguinal region and lower limb: Secondary | ICD-10-CM | POA: Diagnosis not present

## 2016-03-01 LAB — POCT GLYCOSYLATED HEMOGLOBIN (HGB A1C): HEMOGLOBIN A1C: 6.3

## 2016-03-01 NOTE — Progress Notes (Signed)
Subjective:     Patient ID: Gary Marcy., male   DOB: 12-10-1933, 83 y.o.   MRN: [id number]  HPI Patient seen for medical follow-up. He continues complain of increased fatigue.   He is currently undergoing radiation therapy for his non-Hodgkin's lymphoma. He has history of polypharmacy and we tried to simplify his medications last visit. He's had weight gain of 14 pounds since last visit and he states he is not taking his Lasix most days because of his frequent doctor visits. He denies any orthopnea. He has had some increased peripheral edema. Not clear if he is still taking diclofenac. We have advised try to discontinue that.  He had recent CBC and comprehensive metabolic panel yesterday per oncology and these were stable. He has history of poorly controlled type 2 diabetes. History of poor compliance.  Past Medical History:  Diagnosis Date  . Anxiety   . Atrial fibrillation ([place])   . COPD (chronic obstructive pulmonary disease) ([place])    pt. denies  . Coronary artery disease    a. h/o Overlapping stents RCA;  b. 06/2011 Cath: patent stents, nonobs dzs, NL EF.  . Diabetic peripheral neuropathy ([place])   . Diffuse non-Hodgkin's lymphoma of testis ([place]) 09/28/2015  . DM (diabetes mellitus) ([place])    Type 2, peripheral neuropathy.  . Dyspnea    with exertion  . Dysrhythmia   . GERD (gastroesophageal reflux disease)   . History of bronchitis   . History of kidney stones   . Hyperlipidemia   . Hypertension   . Low testosterone   . Nephrolithiasis   . Osteoarthritis    shoulder  . Restless leg   . SVT (supraventricular tachycardia) ([place])   . Urinary frequency    Past Surgical History:  Procedure Laterality Date  . [place]  . CARDIAC CATHETERIZATION  01/2013  . CHOLECYSTECTOMY    . COLONOSCOPY    . CORONARY ANGIOPLASTY  2004  . EYE SURGERY Bilateral    cataracts  . IR GENERIC HISTORICAL  10/05/2015   IR US GUIDE VASC ACCESS RIGHT 10/05/2015 Marybelle Killings, MD [facility]-INTER[facility]  RAD  . IR GENERIC HISTORICAL  10/05/2015   IR FLUORO GUIDE PORT INSERTION RIGHT 10/05/2015 Marybelle Killings, MD [facility] RAD  . LEFT HEART CATHETERIZATION WITH CORONARY ANGIOGRAM N/A 06/18/2011   Procedure: LEFT HEART CATHETERIZATION WITH CORONARY ANGIOGRAM;  Surgeon: Peter M Martinique, MD;  Location: [facility] CATH LAB;  Service: Cardiovascular;  Laterality: N/A;  . LEFT HEART CATHETERIZATION WITH CORONARY ANGIOGRAM N/A 01/27/2013   Procedure: LEFT HEART CATHETERIZATION WITH CORONARY ANGIOGRAM;  Surgeon: Burnell Blanks, MD;  Location: [facility] CATH LAB;  Service: Cardiovascular;  Laterality: N/A;  . LUMBAR LAMINECTOMY/DECOMPRESSION MICRODISCECTOMY N/A 02/07/2015   Procedure: Lumbar three-Sacral one Decompression;  Surgeon: Kevan Ny Ditty, MD;  Location: [facility] NEURO ORS;  Service: Neurosurgery;  Laterality: N/A;  L3 to S1 Decompression  . ORCHIECTOMY Right 09/01/2015   Procedure: RIGHT ORCHIECTOMY;  Surgeon: Kathie Rhodes, MD;  Location: [facility] ORS;  Service: Urology;  Laterality: Right;  . ROTATOR CUFF REPAIR Left     reports that he quit smoking about 37 years ago. His smoking use included Cigarettes. He has a 30.00 pack-year smoking history. He has never used smokeless tobacco. He reports that he does not drink alcohol or use drugs. family history includes Alzheimer's disease in his mother; Arthritis in his brother and sister; Heart disease in his brother, father, mother, and sister; Migraines in his daughter and father; Obesity in  his sister and son; Prostate cancer in his brother; Sleep apnea in his son; Thyroid disease in his daughter; Ulcers in his father. Allergies  Allergen Reactions  . Ace Inhibitors Other (See Comments)    cough  . Codeine Nausea Only and Rash       . Penicillins Rash    Childhood allergy Has patient had a PCN reaction causing immediate rash, facial/tongue/throat swelling, SOB or lightheadedness with hypotension: Yes Has patient had a PCN reaction causing severe rash involving mucus  membranes or skin necrosis: Yes Has patient had a PCN reaction that required hospitalization No Has patient had a PCN reaction occurring within the last 10 years: No If all of the above answers are "NO", then may proceed with Cephalosporin use.      Review of Systems  Constitutional: Positive for fatigue.  Eyes: Negative for visual disturbance.  Respiratory: Negative for cough, chest tightness and shortness of breath.   Cardiovascular: Positive for leg swelling. Negative for chest pain and palpitations.  Genitourinary: Negative for dysuria.  Neurological: Negative for dizziness, syncope, weakness, light-headedness and headaches.       Objective:   Physical Exam  Constitutional: He is oriented to person, place, and time. He appears well-developed and well-nourished.  Cardiovascular: Normal rate and regular rhythm.   Pulmonary/Chest: Effort normal and breath sounds normal. No respiratory distress. He has no wheezes. He has no rales.  Musculoskeletal: He exhibits edema.  Trace pitting edema lower legs bilaterally  Neurological: He is alert and oriented to person, place, and time.       Assessment:     #1 type 2 diabetes with history of poor control  #2 recent weight gain with some peripheral edema. Likely related to patient not taking his Lasix  #3 hypertension stable and at goal    Plan:     -Recheck hemoglobin A1c=6.3% -Hold diclofenac as much as possible -Get back on Lasix most days of the week  [name] MD [facility]

## 2016-03-01 NOTE — Progress Notes (Signed)
Pre visit review using our clinic review tool, if applicable. No additional management support is needed unless otherwise documented below in the visit note. 

## 2016-03-01 NOTE — Patient Instructions (Signed)
Get back on Furosemide 40 mg daily.

## 2016-03-04 ENCOUNTER — Ambulatory Visit
Admission: RE | Admit: 2016-03-04 | Discharge: 2016-03-04 | Disposition: A | Discharge status: Home / Self Care | Payer: Medicare Other | Source: Ambulatory Visit | Attending: Radiation Oncology | Admitting: Radiation Oncology

## 2016-03-04 DIAGNOSIS — C8585 Other specified types of non-Hodgkin lymphoma, lymph nodes of inguinal region and lower limb: Secondary | ICD-10-CM | POA: Diagnosis not present

## 2016-03-05 ENCOUNTER — Ambulatory Visit
Admission: RE | Admit: 2016-03-05 | Discharge: 2016-03-05 | Disposition: A | Discharge status: Home / Self Care | Payer: Medicare Other | Source: Ambulatory Visit | Attending: Radiation Oncology | Admitting: Radiation Oncology

## 2016-03-05 ENCOUNTER — Encounter: Payer: Self-pay | Admitting: Radiation Oncology

## 2016-03-05 VITALS — BP 129/65 | HR 87 | Temp 99.0°F | Resp 20 | Wt 233.8 lb

## 2016-03-05 DIAGNOSIS — C8585 Other specified types of non-Hodgkin lymphoma, lymph nodes of inguinal region and lower limb: Secondary | ICD-10-CM | POA: Diagnosis not present

## 2016-03-05 DIAGNOSIS — C8589 Other specified types of non-Hodgkin lymphoma, extranodal and solid organ sites: Secondary | ICD-10-CM

## 2016-03-05 NOTE — Progress Notes (Signed)
Weekly rad tx testis/scrotum 12/18 completed, no skin breakdown or pain, nio dysuria, last Bowel movement 2 days ago,constipated stated, lack of appetite and energy,  To remove a skin cancer 03/18/16 by Dr. Donah Driver low grade fever, no chills, ambulates with a walker 1:35 PM BP 129/65 (BP Location: Left Arm, Patient Position: Sitting, Cuff Size: Normal)   Pulse 87   Temp 99 F (37.2 C) (Oral)   Resp 20   Wt 233 lb 12.8 oz (106.1 kg)   BMI 33.55 kg/m   Wt Readings from Last 3 Encounters:  03/05/16 233 lb 12.8 oz (106.1 kg)  03/01/16 238 lb (108 kg)  02/29/16 240 lb (108.9 kg)

## 2016-03-05 NOTE — Progress Notes (Signed)
  Radiation Oncology         [phone]) [phone] ________________________________  Name: Gary Yates. MRN: [id number]  Date: 03/05/2016  DOB: 07/25/1933  Weekly Radiation Therapy Management    ICD-9-CM ICD-10-CM   1. Diffuse non-Hodgkin's lymphoma of testis (HCC) 202.80 C85.89      Current Dose: 21.6 Gy     Planned Dose:  32.4 Gy  Narrative . . . . . . . . The patient presents for routine under treatment assessment.                                   Weekly rad tx testis/scrotum 12/18 completed. The patient denies skin breakdown, pain, or dysuria. His last bowel movement was 2 days ago and he has constipation. He has a lack of appetite and energy. The patient has a low grade fever, denies chills. Ambulates with a walker. Biopsy of the right side of his face was positive for skin cancer, he will have surgery to remove this in March.                                  Set-up films were reviewed.                                 The chart was checked. Physical Findings. . .  weight is 233 lb 12.8 oz (106.1 kg). His oral temperature is 99 F (37.2 C). His blood pressure is 129/65 and his pulse is 87. His respiration is 20. . Weight essentially stable.  Lungs are clear to auscultation bilaterally. Heart has regular rate and rhythm. Ambulatory with a walker. Impression . . . . . . . The patient is tolerating radiation. Plan . . . . . . . . . . . . Continue treatment as planned. PET scan scheduled on 03/15/16. He is to use stool softeners and miralax for his constipation. ________________________________   Blair Promise, PhD, MD  This document serves as a record of services personally performed by [name], MD. It was created on his behalf by Darcus Austin, a trained medical scribe. The creation of this record is based on the scribe's personal observations and the provider's statements to them. This document has been checked and approved by the attending provider.

## 2016-03-06 ENCOUNTER — Ambulatory Visit
Admission: RE | Admit: 2016-03-06 | Discharge: 2016-03-06 | Disposition: A | Discharge status: Home / Self Care | Payer: Medicare Other | Source: Ambulatory Visit | Attending: Radiation Oncology | Admitting: Radiation Oncology

## 2016-03-06 ENCOUNTER — Other Ambulatory Visit: Payer: Self-pay | Admitting: Neurological Surgery

## 2016-03-06 DIAGNOSIS — C8585 Other specified types of non-Hodgkin lymphoma, lymph nodes of inguinal region and lower limb: Secondary | ICD-10-CM | POA: Diagnosis not present

## 2016-03-07 ENCOUNTER — Ambulatory Visit
Admission: RE | Admit: 2016-03-07 | Discharge: 2016-03-07 | Disposition: A | Discharge status: Home / Self Care | Payer: Medicare Other | Source: Ambulatory Visit | Attending: Radiation Oncology | Admitting: Radiation Oncology

## 2016-03-07 DIAGNOSIS — C8585 Other specified types of non-Hodgkin lymphoma, lymph nodes of inguinal region and lower limb: Secondary | ICD-10-CM | POA: Diagnosis not present

## 2016-03-08 ENCOUNTER — Ambulatory Visit
Admission: RE | Admit: 2016-03-08 | Discharge: 2016-03-08 | Disposition: A | Discharge status: Home / Self Care | Payer: Medicare Other | Source: Ambulatory Visit | Attending: Radiation Oncology | Admitting: Radiation Oncology

## 2016-03-08 ENCOUNTER — Ambulatory Visit: Payer: Medicare Other

## 2016-03-08 DIAGNOSIS — C8585 Other specified types of non-Hodgkin lymphoma, lymph nodes of inguinal region and lower limb: Secondary | ICD-10-CM | POA: Diagnosis not present

## 2016-03-11 ENCOUNTER — Ambulatory Visit
Admission: RE | Admit: 2016-03-11 | Discharge: 2016-03-11 | Disposition: A | Discharge status: Home / Self Care | Payer: Medicare Other | Source: Ambulatory Visit | Attending: Radiation Oncology | Admitting: Radiation Oncology

## 2016-03-11 ENCOUNTER — Ambulatory Visit: Payer: Medicare Other

## 2016-03-11 ENCOUNTER — Telehealth: Payer: Self-pay | Admitting: Family Medicine

## 2016-03-11 DIAGNOSIS — C8585 Other specified types of non-Hodgkin lymphoma, lymph nodes of inguinal region and lower limb: Secondary | ICD-10-CM | POA: Diagnosis not present

## 2016-03-11 NOTE — Telephone Encounter (Signed)
Patient Name: Gary Yates DOB: 11-23-1933 Initial Comment Caller states having trouble having bm, he is taking radiation tx, finished wih chemo, he has lost a lot of weight Nurse Assessment Nurse: Sherrell Puller, RN, Amy Date/Time (Eastern Time): 03/11/2016 9:31:44 AM Confirm and document reason for call. If symptomatic, describe symptoms. ---Caller states he hasn't had a BM in several days. Not eating much and has lost a lot of weight. Currently on radiation on treatments, finished with chemo. Drinking plenty of fluids. Has taken Milk of Magnesia twice, not helping. No fever or any other symptoms. Does the patient have any new or worsening symptoms? ---Yes Will a triage be completed? ---Yes Related visit to physician within the last 2 weeks? ---No Does the PT have any chronic conditions? (i.e. diabetes, asthma, etc.) ---Yes List chronic conditions. ---Non-hodgkin's lymphoma, Diabetes, ruptured L3 disk. Is this a behavioral health or substance abuse call? ---No Guidelines Guideline Title Affirmed Question Affirmed Notes Constipation Last bowel movement (BM) > 4 days ago Final Disposition User See Physician within 24 Hours [place], RN, Amy Comments PT [place]. REFUSED APPT. TOLD A NOTE WOULD BE MADE IN CHART WITH INFORMATION AND REQUEST FOR CALL BACK Referrals GO TO FACILITY REFUSED Disagree/Comply: Disagree Disagree/Comply Reason: Disagree with instructions

## 2016-03-11 NOTE — Telephone Encounter (Signed)
Would try OTC Miralax.

## 2016-03-11 NOTE — Telephone Encounter (Signed)
Spoke to pt and he reports constipation for about 3 days. He has taken MOM x2 with no results. He has not tried any other OTC remedies. He would like to know what you would recommend. He also reports he has lost weight after chemo/rad tx and also has associated loss of appetite.   Dr. Elease Hashimoto - Please advise. Thanks!

## 2016-03-11 NOTE — Telephone Encounter (Signed)
LMTCB

## 2016-03-11 NOTE — Telephone Encounter (Signed)
Patient called.  He has had a bowel movement and he is feeling better

## 2016-03-12 ENCOUNTER — Encounter: Payer: Self-pay | Admitting: Radiation Oncology

## 2016-03-12 ENCOUNTER — Ambulatory Visit
Admission: RE | Admit: 2016-03-12 | Discharge: 2016-03-12 | Disposition: A | Discharge status: Home / Self Care | Payer: Medicare Other | Source: Ambulatory Visit | Attending: Radiation Oncology | Admitting: Radiation Oncology

## 2016-03-12 VITALS — BP 106/72 | HR 108 | Temp 98.0°F | Ht 70.0 in | Wt 232.8 lb

## 2016-03-12 DIAGNOSIS — C8589 Other specified types of non-Hodgkin lymphoma, extranodal and solid organ sites: Secondary | ICD-10-CM

## 2016-03-12 DIAGNOSIS — C8585 Other specified types of non-Hodgkin lymphoma, lymph nodes of inguinal region and lower limb: Secondary | ICD-10-CM | POA: Diagnosis not present

## 2016-03-12 MED ORDER — BIAFINE EX EMUL
CUTANEOUS | Status: DC | PRN
  Administered 2016-03-12: 13:00:00 via TOPICAL

## 2016-03-12 NOTE — Progress Notes (Signed)
  Radiation Oncology         [phone]) [phone] ________________________________  Name: Gary Yates. MRN: [id number]  Date: 03/12/2016  DOB: 03/28/1933  Weekly Radiation Therapy Management    ICD-9-CM ICD-10-CM   1. Diffuse non-Hodgkin's lymphoma of testis (HCC) 202.80 C85.89 topical emolient (BIAFINE) emulsion     Current Dose: 30.6 Gy     Planned Dose:  32.4 Gy  Narrative . . . . . . . . The patient presents for routine under treatment assessment.                                   Gary Yates has completed 17 fractions to his testis/scrotum. He reports not feeling well since yesterday. He denies having pain.  He reports having fatigue and did not sleep last night. He denies having any bladder/bowel issues. He reports that he has some skin redness on both sides of his groin.                                  Set-up films were reviewed.                                 The chart was checked. Physical Findings. . .  height is 5\' 10"  (1.778 m) and weight is 232 lb 12.8 oz (105.6 kg). His oral temperature is 98 F (36.7 C). His blood pressure is 106/72 and his pulse is 108 (abnormal). His oxygen saturation is 99%. . Weight essentially stable.  Lungs are clear to auscultation bilaterally. Heart has regular rate and rhythm. Ambulatory with a walker. the scrotal area shows diffuse erythema without any skin breakdown or moist desquamation. Impression . . . . . . . The patient is tolerating radiation. Plan . . . . . . . . . . . . Continue treatment as planned. The patient was given a 1 month follow up appointment card. ________________________________   Blair Promise, PhD, MD  This document serves as a record of services personally performed by [name], MD. It was created on his behalf by Darcus Austin, a trained medical scribe. The creation of this record is based on the scribe's personal observations and the provider's statements to them. This document has been checked and approved by the  attending provider.

## 2016-03-12 NOTE — Progress Notes (Signed)
Gary Yates has completed 17 fractions to his testis/scrotum.  He reports not feeling good since yesterday.  He denies having pain.  He reports having fatigue and did not sleep last night.  He denies having any bladder/bowel issues.  He reports that he has some skin redness on both sides of his groin.  He has been given a one month follow up.  BP 106/72 (BP Location: Left Arm, Patient Position: Sitting)   Pulse (!) 108   Temp 98 F (36.7 C) (Oral)   Ht 5\' 10"  (1.778 m)   Wt 232 lb 12.8 oz (105.6 kg)   SpO2 99%   BMI 33.40 kg/m    Wt Readings from Last 3 Encounters:  03/12/16 232 lb 12.8 oz (105.6 kg)  03/05/16 233 lb 12.8 oz (106.1 kg)  03/01/16 238 lb (108 kg)

## 2016-03-13 ENCOUNTER — Encounter: Payer: Self-pay | Admitting: Radiation Oncology

## 2016-03-13 ENCOUNTER — Other Ambulatory Visit: Payer: Self-pay | Admitting: Family Medicine

## 2016-03-13 ENCOUNTER — Ambulatory Visit
Admission: RE | Admit: 2016-03-13 | Discharge: 2016-03-13 | Disposition: A | Discharge status: Home / Self Care | Payer: Medicare Other | Source: Ambulatory Visit | Attending: Radiation Oncology | Admitting: Radiation Oncology

## 2016-03-13 DIAGNOSIS — C8585 Other specified types of non-Hodgkin lymphoma, lymph nodes of inguinal region and lower limb: Secondary | ICD-10-CM | POA: Diagnosis not present

## 2016-03-15 ENCOUNTER — Ambulatory Visit (HOSPITAL_COMMUNITY): Payer: Medicare Other

## 2016-03-15 ENCOUNTER — Ambulatory Visit: Payer: Medicare Other

## 2016-03-15 ENCOUNTER — Encounter: Payer: Self-pay | Admitting: Cardiovascular Disease

## 2016-03-18 ENCOUNTER — Ambulatory Visit: Payer: Medicare Other | Admitting: Family Medicine

## 2016-03-18 ENCOUNTER — Ambulatory Visit: Payer: Medicare Other

## 2016-03-18 ENCOUNTER — Telehealth: Payer: Self-pay | Admitting: Oncology

## 2016-03-18 NOTE — Telephone Encounter (Signed)
Patient called and said he is going to run out of biafine cream and is wondering how to get another tube.  He finished radiation on 03/13/16.  Advised him to stop by and we will get him a refill.

## 2016-03-18 NOTE — Progress Notes (Signed)
°  Radiation Oncology         [phone] ________________________________  Name: Gary Yates. 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 Towards the end of treatment, the patient had diffuse erythema without any skin breakdown or moist desquamation in the scrotal area. The patient denied pain in the treatment area or dysuria. The patient had constipation attributed to narcotic use and he was instructed to use stool softeners and miralax. The patient was fatigued.  Plan: The patient has completed radiation treatment. The patient will return to radiation oncology clinic for routine followup in one month. I advised them to call or return sooner if they have any questions or concerns related to their recovery or treatment.  -----------------------------------  Blair Promise, PhD, MD  This document serves as a record of services personally performed by [name], MD. It was created on his behalf by Darcus Austin, a trained medical scribe. The creation of this record is based on the scribe's personal observations and the provider's statements to them. This document has been checked and approved by the attending provider.

## 2016-03-19 ENCOUNTER — Other Ambulatory Visit: Payer: Self-pay | Admitting: Oncology

## 2016-03-19 DIAGNOSIS — C8589 Other specified types of non-Hodgkin lymphoma, extranodal and solid organ sites: Secondary | ICD-10-CM

## 2016-03-19 DIAGNOSIS — C8585 Other specified types of non-Hodgkin lymphoma, lymph nodes of inguinal region and lower limb: Secondary | ICD-10-CM | POA: Diagnosis not present

## 2016-03-19 MED ORDER — BIAFINE EX EMUL
Freq: Once | CUTANEOUS | Status: AC
  Administered 2016-03-19: 14:00:00 via TOPICAL

## 2016-03-22 ENCOUNTER — Encounter: Payer: Self-pay | Admitting: Cardiovascular Disease

## 2016-03-22 ENCOUNTER — Ambulatory Visit (INDEPENDENT_AMBULATORY_CARE_PROVIDER_SITE_OTHER): Payer: Medicare Other | Admitting: Cardiovascular Disease

## 2016-03-22 VITALS — BP 128/62 | HR 90 | Ht 70.0 in | Wt 235.0 lb

## 2016-03-22 DIAGNOSIS — I480 Paroxysmal atrial fibrillation: Secondary | ICD-10-CM

## 2016-03-22 DIAGNOSIS — Z01810 Encounter for preprocedural cardiovascular examination: Secondary | ICD-10-CM | POA: Diagnosis not present

## 2016-03-22 DIAGNOSIS — I2510 Atherosclerotic heart disease of native coronary artery without angina pectoris: Secondary | ICD-10-CM | POA: Diagnosis not present

## 2016-03-22 NOTE — Patient Instructions (Signed)

## 2016-03-22 NOTE — Progress Notes (Signed)
=   Chief Complaint  Patient presents with  . Follow-up    some swelling in legs. SHOB with and without activity. no CP. always tired ( chemo and radiation treatments, pendign back surgery)      History of Present Illness: 82 yo WM with history of HTN, hyperlipidemia, morbid obesity, DM, CAD, atrial fibrillation, carotid artery disease and COPD who is here for follow up. He has CAD with remote stenting of the RCA. He was admitted to [facility] 06/18/11 with chest pain. Cardiac cath on 06/18/11 with moderate LAD disease with patent RCA stents. No flow limiting lesions. Normal LVEF. He was found to be in atrial fibrillation. He was started on Xarelto but he has since changed to coumadin due to cost. Slurred speech and seen by Neuro. Head CT without evidence of CVA. Head MRI with questionable tiny acute infarct posterior left frontal lobe, mild small vessel disease type changes, global atrophy without hydrocephalus. He is known to have very mild bilateral carotid artery disease (last dopplers November 2016). He was seen 12/23/12 and had c/o chest pressure when working in the yard. Stress myoview 01/13/13 with small inferior wall defect from base to apex with mild peri-infarct ischemia. LVEF=53%. Cardiac cath 01/27/13 with stable disease. Admitted to [facility] 02/03/13 with chest pain. Mild elevation troponin. Started on Imdur and Plavix. Carotid dopplers with mild bilateral disease. He has been diagnosed with non-Hodgkins lymphoma. He has had testicular surgery followed by radiation. He had a skin cancer removed last month.   He is here today for follow up. He has no recent chest pains or dyspnea. Chronic lower ext edema, slightly worse on days he does not take Lasix. No syncope. He has chronic balance issues. He has planned back surgery in April 2018.   Primary Care Physician: [name], MD   Past Medical History:  Diagnosis Date  . Anxiety   . Atrial fibrillation ([place])   . COPD (chronic  obstructive pulmonary disease) ([place])    pt. denies  . Coronary artery disease    a. h/o Overlapping stents RCA;  b. 06/2011 Cath: patent stents, nonobs dzs, NL EF.  . Diabetic peripheral neuropathy ([place])   . Diffuse non-Hodgkin's lymphoma of testis ([place]) 09/28/2015  . DM (diabetes mellitus) ([place])    Type 2, peripheral neuropathy.  . Dyspnea    with exertion  . Dysrhythmia   . GERD (gastroesophageal reflux disease)   . History of bronchitis   . History of kidney stones   . Hyperlipidemia   . Hypertension   . Low testosterone   . Nephrolithiasis   . Osteoarthritis    shoulder  . Restless leg   . SVT (supraventricular tachycardia) ([place])   . Urinary frequency     Past Surgical History:  Procedure Laterality Date  . [place]  . CARDIAC CATHETERIZATION  01/2013  . CHOLECYSTECTOMY    . COLONOSCOPY    . CORONARY ANGIOPLASTY  2004  . EYE SURGERY Bilateral    cataracts  . IR GENERIC HISTORICAL  10/05/2015   IR US GUIDE VASC ACCESS RIGHT 10/05/2015 Marybelle Killings, MD [facility]-INTER[facility] RAD  . IR GENERIC HISTORICAL  10/05/2015   IR FLUORO GUIDE PORT INSERTION RIGHT 10/05/2015 Marybelle Killings, MD [facility]  . LEFT HEART CATHETERIZATION WITH CORONARY ANGIOGRAM N/A 06/18/2011   Procedure: LEFT HEART CATHETERIZATION WITH CORONARY ANGIOGRAM;  Surgeon: Peter M Martinique, MD;  Location: [facility] CATH LAB;  Service: Cardiovascular;  Laterality: N/A;  . LEFT  HEART CATHETERIZATION WITH CORONARY ANGIOGRAM N/A 01/27/2013   Procedure: LEFT HEART CATHETERIZATION WITH CORONARY ANGIOGRAM;  Surgeon: Burnell Blanks, MD;  Location: [facility] CATH LAB;  Service: Cardiovascular;  Laterality: N/A;  . LUMBAR LAMINECTOMY/DECOMPRESSION MICRODISCECTOMY N/A 02/07/2015   Procedure: Lumbar three-Sacral one Decompression;  Surgeon: Kevan Ny Ditty, MD;  Location: [place] NEURO ORS;  Service: Neurosurgery;  Laterality: N/A;  L3 to S1 Decompression  . ORCHIECTOMY Right 09/01/2015   Procedure: RIGHT ORCHIECTOMY;  Surgeon: Kathie Rhodes, MD;  Location: [facility] ORS;  Service: Urology;  Laterality: Right;  . ROTATOR CUFF REPAIR Left     Current Outpatient Prescriptions  Medication Sig Dispense Refill  . ACCU-CHEK AVIVA PLUS test strip TEST 3 TIMES A DAY 300 each 5  . acetaminophen (TYLENOL) 500 MG tablet Take 500 mg by mouth every 6 (six) hours as needed for mild pain.    Marland Kitchen atorvastatin (LIPITOR) 40 MG tablet TAKE 1 TABLET BY MOUTH DAILY 90 tablet 2  . clonazePAM (KLONOPIN) 1 MG tablet TAKE 1 TABLET EVERY DAY AT BEDTIME AS NEEDED 30 tablet 5  . clopidogrel (PLAVIX) 75 MG tablet TAKE 1 TABLET BY MOUTH DAILY 30 tablet 8  . emollient (BIAFINE) cream Apply topically 2 (two) times daily.    . furosemide (LASIX) 40 MG tablet TAKE 1 TABLET BY MOUTH EVERY DAY 30 tablet 1  . gabapentin (NEURONTIN) 300 MG capsule Take 2 capsules (600 mg total) by mouth 3 (three) times daily. 180 capsule 2  . Insulin Glargine (TOUJEO SOLOSTAR) 300 UNIT/ML SOPN Inject 20 Units into the skin daily after breakfast.     . insulin lispro (HUMALOG) 100 UNIT/ML injection Take 1 times daily with meals as per sliding scale.    . metFORMIN (GLUCOPHAGE) 500 MG tablet TAKE 1 TABLET BY MOUTH TWICE A DAY WITH A MEAL 60 tablet 5  . metoprolol succinate (TOPROL-XL) 100 MG 24 hr tablet TAKE 1 TABLET EVERY DAY IMMEDIATELY FOLLOWING A MEAL 90 tablet 1  . nitroGLYCERIN (NITROSTAT) 0.4 MG SL tablet Place 1 tablet (0.4 mg total) under the tongue every 5 (five) minutes as needed. Chest pain 25 tablet 6  . oxybutynin (DITROPAN XL) 15 MG 24 hr tablet Take 15 mg by mouth every morning.    . pantoprazole (PROTONIX) 40 MG tablet TAKE 1 TABLET BY MOUTH EVERY DAY 90 tablet 3  . tamsulosin (FLOMAX) 0.4 MG CAPS capsule Take 0.4 mg by mouth daily.    Marland Kitchen warfarin (COUMADIN) 5 MG tablet TAKE AS DIRECTED 40 tablet 1   No current facility-administered medications for this visit.    Facility-Administered Medications Ordered in Other Visits  Medication Dose Route Frequency Provider Last  Rate Last Dose  . testosterone cypionate (DEPOTESTOTERONE CYPIONATE) injection 200 mg  200 mg Intramuscular Q28 days [name], MD   200 mg at 12/26/11 0850  . testosterone cypionate (DEPOTESTOTERONE CYPIONATE) injection 200 mg  200 mg Intramuscular Q28 days [name], MD   200 mg at 01/23/12 0908  . testosterone cypionate (DEPOTESTOTERONE CYPIONATE) injection 200 mg  200 mg Intramuscular Q28 days [name], MD   200 mg at 02/25/12 0848  . testosterone cypionate (DEPOTESTOTERONE CYPIONATE) injection 200 mg  200 mg Intramuscular Q28 days [name], MD   200 mg at 06/03/12 1437    Allergies  Allergen Reactions  . Ace Inhibitors Other (See Comments)    cough  . Codeine Nausea Only and Rash       . Penicillins Rash  Childhood allergy Has patient had a PCN reaction causing immediate rash, facial/tongue/throat swelling, SOB or lightheadedness with hypotension: Yes Has patient had a PCN reaction causing severe rash involving mucus membranes or skin necrosis: Yes Has patient had a PCN reaction that required hospitalization No Has patient had a PCN reaction occurring within the last 10 years: No If all of the above answers are "NO", then may proceed with Cephalosporin use.     Social History   Social History  . Marital status: Married    Spouse name: N/A  . Number of children: 3  . Years of education: N/A   Occupational History  . Retired Retired   Social History Main Topics  . Smoking status: Former Smoker    Packs/day: 1.50    Years: 20.00    Types: Cigarettes    Quit date: 04/05/1978  . Smokeless tobacco: Never Used  . Alcohol use No  . Drug use: No  . Sexual activity: Not Currently   Other Topics Concern  . Not on file   Social History Narrative  . No narrative on file    Family History  Problem Relation Age of Onset  . Alzheimer's disease Mother   . Heart disease Mother   . Heart disease Father   . Migraines Father   . Ulcers  Father   . Prostate cancer Brother   . Coronary artery disease      Male 1st degree relative <50  . Coronary artery disease      male 1st degree relative <60  . Heart disease Sister   . Obesity Sister     Morbid  . Arthritis Sister   . Heart disease Brother   . Arthritis Brother   . Sleep apnea Son   . Obesity Son   . Migraines Daughter   . Thyroid disease Daughter     Review of Systems:  As stated in the HPI and otherwise negative.   BP 128/62   Pulse 90   Ht 5\' 10"  (1.778 m)   Wt 235 lb (106.6 kg)   SpO2 96%   BMI 33.72 kg/m   Physical Examination: General: Well developed, well nourished, NAD  HEENT: OP clear, mucus membranes moist  SKIN: warm, dry. No rashes. Neuro: No focal deficits  Musculoskeletal: Muscle strength 5/5 all ext  Psychiatric: Mood and affect normal  Neck: No JVD, no carotid bruits, no thyromegaly, no lymphadenopathy.  Lungs:Clear bilaterally, no wheezes, rhonci, crackles Cardiovascular: Regular rate and rhythm. No murmurs, gallops or rubs. Abdomen:Soft. Bowel sounds present. Non-tender.  Extremities: No lower extremity edema. Pulses are 2 + in the bilateral DP/PT.  Stress myoview 01/13/13: Stress Procedure: The patient received IV Lexiscan 0.4 mg over 15-seconds. Technetium 54m Sestamibi injected at 30-seconds. Quantitative spect images were obtained after a 45 minute delay. During the infusion of Lexiscan, the patient complained of a "strange" feeling and a headache. These symptoms resolved in recovery.  Stress ECG: No significant change from baseline ECG  QPS  Raw Data Images: Normal; no motion artifact; normal heart/lung ratio.  Stress Images: There is decreased uptake in the inferior wall.  Rest Images: There is decreased uptake in the inferior wall.  Subtraction (SDS): mixed infarct and ischemia  Transient Ischemic Dilatation (Normal <1.22): 0.98  Lung/Heart Ratio (Normal <0.45): 0.40  Quantitative Gated Spect Images  QGS EDV: 126 ml  QGS  ESV: 54 ml  Impression  Exercise Capacity: Lexiscan with no exercise.  BP Response: Normal blood pressure response.  Clinical  Symptoms: Headache  ECG Impression: No significant ST segment change suggestive of ischemia.  Comparison with Prior Nuclear Study: No images to compare  Overall Impression: Low risk stress nuclear study Small inferior wall infarct from apex to base with mild peri infarct ischemia.  LV Ejection Fraction: 57%. LV Wall Motion: NL LV Function; NL Wall Motion  Cardiac cath 01/27/13: Left main: No obstructive disease.  Left Anterior Descending Artery: Large caliber vessel in the proximal segment with trifurcation in the mid segment into a large septal perforating branch, moderate caliber bifurcating diagonal branch and small to moderate caliber continuation of the LAD. There appears to be a 40-50% stenosis at the trifurcation in the mid LAD. The diagonal branch bifurcates. The superior branch of the diagonal after the bifurcation is small in caliber with 70% stenosis.  Circumflex Artery: Large caliber vessel with large caliber obtuse marginal branch. The proximal and mid vessel has diffuse 30% stenosis, unchanged. The obtuse marginal branch has diffuse 30% stenosis in the proximal segment of the vessel.  Right Coronary Artery: Large caliber dominant vessel with patent proximal and mid stented segment with 20% stent restenosis. Mild plaque in the large caliber PDA.   Echo 09/20/15: - Left ventricle: The cavity size was normal. Wall thickness was   increased in a pattern of mild LVH. Systolic function was normal.   The estimated ejection fraction was in the range of 55% to 60%.   Wall motion was normal; there were no regional wall motion   abnormalities. Doppler parameters are consistent with abnormal   left ventricular relaxation (grade 1 diastolic dysfunction). - Mitral valve: MIldly calcified leaflet tips. There was mild   regurgitation. - Atrial septum: No defect or patent  foramen ovale was identified. - Impressions: Low normal GLS -16.2.  EKG:  EKG is not ordered today. The ekg ordered today demonstrates  Recent Labs: 02/29/2016: ALT(SGPT) 14; BUN, Bld 12; Creat 1.0; HGB 11.3; Platelets 184; Potassium 3.5; Sodium 138   Lipid Panel    Component Value Date/Time   CHOL 139 05/10/2015 1212   TRIG 151.0 (H) 05/10/2015 1212   HDL 32.40 (L) 05/10/2015 1212   CHOLHDL 4 05/10/2015 1212   VLDL 30.2 05/10/2015 1212   LDLCALC 76 05/10/2015 1212   LDLDIRECT 85.0 12/05/2014 1126     Wt Readings from Last 3 Encounters:  03/22/16 235 lb (106.6 kg)  03/12/16 232 lb 12.8 oz (105.6 kg)  03/05/16 233 lb 12.8 oz (106.1 kg)     Other studies Reviewed: Additional studies/ records that were reviewed today include: . Review of the above records demonstrates:    Assessment and Plan:   1. CAD without angina: He has no recent chest pain concerning for unstable angina. He is known to have moderate CAD wth last cardiac cath in 2015 with stable disease in the LAD and patent RCA stents. Echo September 2017 6 with normal LV function. Continue Plavix, beta blocker, statin.  No ASA since he is also on coumadin.   2. Paroxysmal atrial fibrillation: He is maintaining NSR. He did not want to start Xarelto due to cost. He has been maintained on coumadin therapy. Will continue Toprol at current dose.   3. Sleep apnea: He snores loudly at night, has excessive daytime fatigue with somnolence but trouble sleeping at night. Referral to Pulmonary but pt did not keep this appt.   4.  Pre-operative cardiovascular examination: He has no recent chest pains or dyspnea to suggest angina. He is not active due to  balance issues but he is doing well overall. Recent normal LV function by echo. Based on current guidelines, he can proceed with his planned surgical procedure. He will hold coumadin and Plavix 5 days before his procedure.   Current medicines are reviewed at length with the patient  today.  The patient does not have concerns regarding medicines.  The following changes have been made:    Labs/ tests ordered today include:   No orders of the defined types were placed in this encounter.   Disposition:   FU with me in 12 months  Signed, Lauree Chandler, MD 03/22/2016 [facility] [place], [place], [place]  [address] Phone: [phone]; Fax: [phone]

## 2016-03-25 ENCOUNTER — Telehealth: Payer: Self-pay | Admitting: General Practice

## 2016-03-25 NOTE — Telephone Encounter (Signed)
Please take last dose of coumadin and Plavix on 3/29 in preparation for surgery on 4/3.  See Dr. Camillia Herter OV note on 3/16.

## 2016-04-02 ENCOUNTER — Other Ambulatory Visit: Payer: Self-pay | Admitting: Family Medicine

## 2016-04-02 ENCOUNTER — Encounter (HOSPITAL_COMMUNITY)
Admission: RE | Admit: 2016-04-02 | Discharge: 2016-04-02 | Disposition: A | Discharge status: Home / Self Care | Payer: Medicare Other | Source: Ambulatory Visit | Attending: Neurological Surgery | Admitting: Neurological Surgery

## 2016-04-02 ENCOUNTER — Telehealth: Payer: Self-pay | Admitting: Family Medicine

## 2016-04-02 ENCOUNTER — Encounter (HOSPITAL_COMMUNITY): Payer: Self-pay

## 2016-04-02 DIAGNOSIS — Z01812 Encounter for preprocedural laboratory examination: Secondary | ICD-10-CM | POA: Diagnosis not present

## 2016-04-02 HISTORY — DX: Headache: R51

## 2016-04-02 HISTORY — DX: Headache, unspecified: R51.9

## 2016-04-02 HISTORY — DX: Presence of dental prosthetic device (complete) (partial): Z97.2

## 2016-04-02 LAB — BASIC METABOLIC PANEL
Anion gap: 9 (ref 5–15)
BUN: 17 mg/dL (ref 6–20)
CALCIUM: 9.5 mg/dL (ref 8.9–10.3)
CO2: 25 mmol/L (ref 22–32)
CREATININE: 0.87 mg/dL (ref 0.61–1.24)
Chloride: 102 mmol/L (ref 101–111)
GFR calc Af Amer: >60 mL/min (ref >60)
GLUCOSE: 133 mg/dL — AB (ref 65–99)
POTASSIUM: 4.2 mmol/L (ref 3.5–5.1)
SODIUM: 136 mmol/L (ref 135–145)

## 2016-04-02 LAB — GLUCOSE, CAPILLARY: Glucose-Capillary: 153 mg/dL — ABNORMAL HIGH (ref 65–99)

## 2016-04-02 LAB — CBC
HEMATOCRIT: 35.3 % — AB (ref 39.0–52.0)
Hemoglobin: 11.7 g/dL — ABNORMAL LOW (ref 13.0–17.0)
MCH: 29.4 pg (ref 26.0–34.0)
MCHC: 33.1 g/dL (ref 30.0–36.0)
MCV: 88.7 fL (ref 78.0–100.0)
PLATELETS: 162 10*3/uL (ref 150–400)
RBC: 3.98 MIL/uL — ABNORMAL LOW (ref 4.22–5.81)
RDW: 14 % (ref 11.5–15.5)
WBC: 6 10*3/uL (ref 4.0–10.5)

## 2016-04-02 LAB — SURGICAL PCR SCREEN
MRSA, PCR: NEGATIVE
Staphylococcus aureus: NEGATIVE

## 2016-04-02 NOTE — Telephone Encounter (Signed)
He should be taking the Metoprolol.  What we instructed him to stop was Diclofenac (because of his Warfarin).

## 2016-04-02 NOTE — Pre-Procedure Instructions (Signed)
Gary Yates.  04/02/2016      [id number] [name], [place] [phone] [facility] [address] [place] [address] Phone: [phone] Fax: [phone]  [place], [place] [facility] [address] [place] [address] [place] [address] Phone: [phone] Fax: [phone]    Your procedure is scheduled on Tuesday, April 09, 2016  Report to [facility] Admitting at 5:30 A.M.  Call this number if you have problems the morning of surgery:  [phone]   Remember: Last dose of Plavix and Coumadin Wednesday, April 03, 2016 ( Per MD)  Do not eat food or drink liquids after midnight Monday, April 08, 2016  Take these medicines the morning of surgery with A SIP OF WATER : gabapentin (NEURONTIN),   pantoprazole (PROTONIX), if needed: Tylenol for pain, nitroGLYCERIN for chest pain Stop taking Aspirin, vitamins, fish oil and herbal medications. Do not take any NSAIDs ie: Ibuprofen, Advil, Naproxen, BC and Goody Powder or any medication containing Aspirin; stop Wednesday, April 03, 2016.    How to Manage Your Diabetes Before and After Surgery  Why is it important to control my blood sugar before and after surgery? . Improving blood sugar levels before and after surgery helps healing and can limit problems. . A way of improving blood sugar control is eating a healthy diet by: o  Eating less sugar and carbohydrates o  Increasing activity/exercise o  Talking with your doctor about reaching your blood sugar goals . High blood sugars (greater than 180 mg/dL) can raise your risk of infections and slow your recovery, so you will need to focus on controlling your diabetes during the weeks before surgery. . Make sure that the doctor who takes care of your diabetes knows about your planned surgery including the date and location.  How do I manage my blood sugar before surgery? . Check your blood sugar at  least 4 times a day, starting 2 days before surgery, to make sure that the level is not too high or low. o Check your blood sugar the morning of your surgery when you wake up and every 2 hours until you get to the Short Stay unit. . If your blood sugar is less than 70 mg/dL, you will need to treat for low blood sugar: o Do not take insulin. o Treat a low blood sugar (less than 70 mg/dL) with  cup of clear juice (cranberry or apple), 4 glucose tablets, OR glucose gel. o Recheck blood sugar in 15 minutes after treatment (to make sure it is greater than 70 mg/dL). If your blood sugar is not greater than 70 mg/dL on recheck, call [phone] for further instructions. . Report your blood sugar to the short stay nurse when you get to Short Stay.  . If you are admitted to the hospital after surgery: o Your blood sugar will be checked by the staff and you will probably be given insulin after surgery (instead of oral diabetes medicines) to make sure you have good blood sugar levels. o The goal for blood sugar control after surgery is 80-180 mg/dL.  WHAT DO I DO ABOUT MY DIABETES MEDICATION?   Marland Kitchen Do not take oral diabetes medicines (pills) the morning of surgery such as metFORMIN (GLUCOPHAGE)    . HE MORNING OF SURGERY, take  10 units  Insulin Glargine (TOUJEO SOLOSTAR)   Patient Signature:  Date:   Nurse Signature:  Date:   Reviewed and Endorsed by [facility] Patient Education Committee, August 2015  Do not wear jewelry, make-up or nail polish.  Do not wear lotions, powders, or perfumes, or deoderant.  Do not shave 48 hours prior to surgery.  Men may shave face and neck.  Do not bring valuables to the hospital.  [facility] is not responsible for any belongings or valuables.  Contacts, dentures or bridgework may not be worn into surgery.  Leave your suitcase in the car.  After surgery it may be brought to your room.  For patients admitted to the hospital, discharge time will be determined by  your treatment team.  Patients discharged the day of surgery will not be allowed to drive home.   Special instructions:Shower the night before surgery and the morning of surgery with CHG.  Please read over the following fact sheets that you were given. Pain Booklet, Coughing and Deep Breathing, MRSA Information and Surgical Site Infection Prevention

## 2016-04-02 NOTE — Telephone Encounter (Signed)
° °  They need to know today because he is having surgery in a week. Pt told his daughter that Dr Elease Hashimoto tolld him off the med and she is just trying to verify     Pt daughter Lattie Haw call to ask if pt should still be taking the below med. Would like a call back   metoprolol succinate (TOPROL-XL) 100 MG 24 hr tablet

## 2016-04-02 NOTE — Telephone Encounter (Signed)
Spoke with daughter.  She will send a list of the medications Gary Yates is taking and we will clarify and send a new list.

## 2016-04-02 NOTE — Progress Notes (Signed)
Pt denies SOB and chest pain but is under the care of Dr. Julianne Handler, Cardiology. Clarified with Sherrie, Surgical Coordinator, that pt should take last dose of Plavix and Coumadin tomorrow, Wednesday, 04/03/16. Pt chart forwarded to anesthesia for review. Pt daughter calling MD to clarify if pt Metoprolol has been discontinued as pt stated.

## 2016-04-03 ENCOUNTER — Ambulatory Visit: Payer: Medicare Other

## 2016-04-03 ENCOUNTER — Encounter (HOSPITAL_COMMUNITY): Payer: Self-pay | Admitting: Emergency Medicine

## 2016-04-03 ENCOUNTER — Other Ambulatory Visit: Payer: Self-pay | Admitting: Family Medicine

## 2016-04-04 ENCOUNTER — Telehealth: Payer: Self-pay | Admitting: Family Medicine

## 2016-04-04 MED ORDER — INSULIN GLARGINE 300 UNIT/ML ~~LOC~~ SOPN: 20.0000 [IU] | PEN_INJECTOR | Freq: Every day | SUBCUTANEOUS | 0 refills | Status: DC

## 2016-04-04 NOTE — Telephone Encounter (Signed)
Pt need new Rx for Toujeo  [facility] [address] [facility] [place]

## 2016-04-04 NOTE — Telephone Encounter (Signed)
Please let him know PCP out of office and see if is urgent. If so, see what he is taking and is ok to send 1 month refill. O/w would defer to PCP. Thanks.

## 2016-04-04 NOTE — Progress Notes (Signed)
Call from family member of pt., Ms. Elmore, she confirms that the pt. Takes Metoprolol & will take it  the a.m. Of surgery.

## 2016-04-04 NOTE — Telephone Encounter (Signed)
I called the pt and he stated he is out of the medication.  He is aware a refill was sent to his pharmacy.

## 2016-04-04 NOTE — Progress Notes (Signed)
Anesthesia chart review: Patient is an 83 year old male scheduled for right L3-4 discectomy on 04/10/15 by Dr. Cyndy Freeze.   History includes CAD S/P overlapping RCA stents '04, SVT, PAF, hypertension, hyperlipidemia, GERD, RLS, diabetes mellitus type 2 with peripheral neuropathy, anxiety, exertional dyspnea, nephrolithiasis, L3-S1 decompression 02/07/15, right orchiectomy 09/01/15 (pathology: diffuse large B-cell lymphoma) s/p chemoradiation, appendectomy. BMI is consistent with obesity.   - PCP is Dr. Carolann Littler.  - Cardiologist is Dr. Angelena Form, last visit 03/22/16. He wrote, "Pre-operative cardiovascular examination: He has no recent chest pains or dyspnea to suggest angina. He is not active due to balance issues but he is doing well overall. Recent normal LV function by echo. Based on current guidelines, he can proceed with his planned surgical procedure. He will hold coumadin and Plavix 5 days before his procedure."  - HEM-ONC is Dr. Marin Olp. RAD-ONC is Dr. Sondra Come. On 02/29/16, Dr. Marin Olp wrote, "He has a lot of back issues. I don't see any problems from an oncologic point of view with him having back surgery."  Meds include Lipitor, Klonopin, Plavix, Lasix, Toujeo, Humalog, metformin, Nitro, Ditropan XL, Protonix, Mirapex, Flomax, trospium chloride,  warfarin. Patient reported holding warfarin 5 days prior to surgery, last Plavix 04/02/16.  Wt 237 lb 8 oz (107.7 kg)   BMI 34.08 kg/m  Vitals were not recorded at PAT.  EKG 01/21/16: ST at 107 bpm, right BBB, LAFB, bifascicular block.  Echo 09/20/15: Study Conclusions - Left ventricle: The cavity size was normal. Wall thickness was   increased in a pattern of mild LVH. Systolic function was normal.   The estimated ejection fraction was in the range of 55% to 60%.   Wall motion was normal; there were no regional wall motion   abnormalities. Doppler parameters are consistent with abnormal   left ventricular relaxation (grade 1 diastolic  dysfunction). - Mitral valve: MIldly calcified leaflet tips. There was mild   regurgitation. - Atrial septum: No defect or patent foramen ovale was identified. - Impressions: Low normal GLS -16.2.  Cardiac cath 01/27/13: Hemodynamic Findings: Central aortic pressure: 144/82 Left ventricular pressure: 146/10/13 Angiographic Findings: Left main: No obstructive disease.  Left Anterior Descending Artery: Large caliber vessel in the proximal segment with trifurcation in the mid segment into a large septal perforating branch, moderate caliber bifurcating diagonal branch and small to moderate caliber continuation of the LAD. There appears to be a 40-50% stenosis at the trifurcation in the mid LAD. The diagonal branch bifurcates. The superior branch of the diagonal after the bifurcation is small in caliber with 70% stenosis.  Circumflex Artery: Large caliber vessel with large caliber obtuse marginal branch. The proximal and mid vessel has diffuse 30% stenosis, unchanged. The obtuse marginal branch has diffuse 30% stenosis in the proximal segment of the vessel.  Right Coronary Artery: Large caliber dominant vessel with patent proximal and mid stented segment with 20% stent restenosis. Mild plaque in the large caliber PDA.  Left Ventricular Angiogram: Deferred.  Impression: 1. Double vessel CAD with patent stents RCA, moderate disease in the LAD which is unchanged from last cath in 2013.  2. Dyspnea and fatigue likely multifactorial 3. No focal targets for PCI Recommendations: Continue medical management of CAD. Continue to attempt weight loss.   Nuclear stress test 01/13/13: Overall Impression: Low risk stress nuclear study Small inferior wall infarct from apex to base with mild peri infarct ischemia. LV Ejection Fraction: 57%. LV Wall Motion: NL LV Function; NL Wall Motion.  Event Monitor 06/25/11-07/15/11: NSR, PVCs, IVCD.  Carotid U/S 11/30/14: Smooth plaque, bilaterally. 1-39% bilateral  ICA stenosis. Patent vertebral arteries with antegrade flow. Elevated right subclavian velocities (50% R SCA stenosis by 11/2012 U/S).   Chest CT 11/20/15: IMPRESSION: 1. Peribronchovascular nodular airspace consolidation in the right upper lobe and lingula is new from 11/03/2015 and most indicative of an infectious or inflammatory process in this patient with a cough for 2-3 weeks. 2. Left adrenal metastasis is stable in the short interval from 11/03/2015. 3. Scattered tiny pulmonary nodules, unchanged. 4. Aortic atherosclerosis (ICD10-170.0). Coronary artery calcification. 5.  Cirrhosis (PET62-O46.95).  Preoperative labs noted. Cr 0.87, H/H 11.7/35.3.  A1c 6.3 on 03/01/16. He is for PT/INR on the day of surgery.  If no acute changes then I anticipate that he can proceed as planned.  George Hugh [facility]/Anesthesiology Phone [phone] 04/04/2016 2:11 PM

## 2016-04-08 ENCOUNTER — Encounter: Payer: Self-pay | Admitting: Family Medicine

## 2016-04-08 ENCOUNTER — Ambulatory Visit (INDEPENDENT_AMBULATORY_CARE_PROVIDER_SITE_OTHER): Payer: Medicare Other | Admitting: Family Medicine

## 2016-04-08 VITALS — BP 100/60 | HR 72 | Temp 97.6°F | Wt 234.4 lb

## 2016-04-08 DIAGNOSIS — I10 Essential (primary) hypertension: Secondary | ICD-10-CM

## 2016-04-08 DIAGNOSIS — M7989 Other specified soft tissue disorders: Secondary | ICD-10-CM | POA: Diagnosis not present

## 2016-04-08 DIAGNOSIS — L03115 Cellulitis of right lower limb: Secondary | ICD-10-CM

## 2016-04-08 MED ORDER — CLINDAMYCIN HCL 300 MG PO CAPS: 300.0000 mg | ORAL_CAPSULE | Freq: Three times a day (TID) | ORAL | 0 refills | Status: DC

## 2016-04-08 MED ORDER — VANCOMYCIN HCL 10 G IV SOLR
1500.0000 mg | INTRAVENOUS | Status: DC
  Filled 2016-04-08: qty 1500

## 2016-04-08 NOTE — Patient Instructions (Addendum)
It was a pleasure to see you today. Please follow up with Dr. Elease Hashimoto in 2 days as we discussed or sooner if needed in this office.   Cellulitis, Adult Cellulitis is a skin infection. The infected area is usually red and sore. This condition occurs most often in the arms and lower legs. It is very important to get treated for this condition. Follow these instructions at home:  Take over-the-counter and prescription medicines only as told by your doctor.  If you were prescribed an antibiotic medicine, take it as told by your doctor. Do not stop taking the antibiotic even if you start to feel better.  Drink enough fluid to keep your pee (urine) clear or pale yellow.  Do not touch or rub the infected area.  Raise (elevate) the infected area above the level of your heart while you are sitting or lying down.  Place warm or cold wet cloths (warm or cold compresses) on the infected area. Do this as told by your doctor.  Keep all follow-up visits as told by your doctor. This is important. These visits let your doctor make sure your infection is not getting worse. Contact a doctor if:  You have a fever.  Your symptoms do not get better after 1-2 days of treatment.  Your bone or joint under the infected area starts to hurt after the skin has healed.  Your infection comes back. This can happen in the same area or another area.  You have a swollen bump in the infected area.  You have new symptoms.  You feel ill and also have muscle aches and pains. Get help right away if:  Your symptoms get worse.  You feel very sleepy.  You throw up (vomit) or have watery poop (diarrhea) for a long time.  There are red streaks coming from the infected area.  Your red area gets larger.  Your red area turns darker. This information is not intended to replace advice given to you by your health care provider. Make sure you discuss any questions you have with your health care provider. Document  Released: 06/12/2007 Document Revised: 06/01/2015 Document Reviewed: 11/02/2014 Elsevier Interactive Patient Education  2017 [employer] NOW OFFER   [facility] FAST TRACK!!!  SAME DAY Appointments for ACUTE CARE  Such as: Sprains, Injuries, cuts, abrasions, rashes, muscle pain, joint pain, back pain Colds, flu, sore throats, headache, allergies, cough, fever  Ear pain, sinus and eye infections Abdominal pain, nausea, vomiting, diarrhea, upset stomach Animal/insect bites  3 Easy Ways to Schedule: Walk-In Scheduling Call in scheduling Mychart Sign-up: [email or web address]

## 2016-04-08 NOTE — Progress Notes (Signed)
Subjective:    Patient ID: Gary Yates., male    DOB: Apr 29, 1933, 83 y.o.   MRN: [id number]  HPI  Gary Yates is an 83 year old male who presents today with bilateral swelling in his feet. He has swelling in his right foot with change in color of his toes that started 2 days ago.  Associated pain in right foot.  Pain is rated as a 6 to 7 and aching.  Aggravated with walking for extended periods of time but walking around his home does not increase pain. Alleviated with elevation.  Associated warmth and minor erythema present. Symptom started 2 days. He is currently on warfarin and plavix but he stopped these on 04/03/16 due to upcoming surgery back surgery. He denies pain, fever, chills, or sweats. He reports swelling of feet bilaterally that has been present for an extended period of time.  He reports seeing his cardiologist last week who suggested flomax however patient did not do this as he states that he cannot go anywhere when taking this medication due to urination. He has a follow up in one year with cardiologist. PMH of T2DM, chronic peripheral neuropathy, HTN, CAD, PAF, COPD, non-Hodgkin's lymphoma of testis, CKD stage III, morbid obesity  He reports restarting metoprolol on 04/03/16 and BP has been well controlled.       Review of Systems  Constitutional: Negative for chills, fatigue and fever.  Respiratory: Negative for cough, shortness of breath and wheezing.   Cardiovascular: Positive for leg swelling. Negative for chest pain and palpitations.  Gastrointestinal: Negative for abdominal pain, constipation, diarrhea, nausea and vomiting.  Musculoskeletal: Negative for myalgias.  Skin: Positive for color change.       Change in color of right toes  Neurological: Negative for dizziness, light-headedness and headaches.   Past Medical History:  Diagnosis Date  . Anxiety   . Atrial fibrillation ([place])   . COPD (chronic obstructive pulmonary disease) ([place])    pt. denies  .  Coronary artery disease    a. h/o Overlapping stents RCA;  b. 06/2011 Cath: patent stents, nonobs dzs, NL EF.  . Diabetic peripheral neuropathy ([place])   . Diffuse non-Hodgkin's lymphoma of testis ([place]) 09/28/2015  . DM (diabetes mellitus) ([place])    Type 2, peripheral neuropathy.  . Dyspnea    with exertion  . Dysrhythmia   . GERD (gastroesophageal reflux disease)   . Headache   . History of bronchitis   . History of kidney stones   . Hyperlipidemia   . Hypertension   . Low testosterone   . Nephrolithiasis   . Osteoarthritis    shoulder  . Restless leg   . SVT (supraventricular tachycardia) ([place])   . Urinary frequency   . Wears partial dentures    upper and lower     Social History   Social History  . Marital status: Married    Spouse name: N/A  . Number of children: 3  . Years of education: N/A   Occupational History  . Retired Retired   Social History Main Topics  . Smoking status: Former Smoker    Packs/day: 1.50    Years: 20.00    Types: Cigarettes    Quit date: 04/05/1978  . Smokeless tobacco: Never Used  . Alcohol use No  . Drug use: No  . Sexual activity: Not Currently   Other Topics Concern  . Not on file   Social History Narrative  . No narrative on file  Past Surgical History:  Procedure Laterality Date  . APPENDECTOMY    . [place]  . CARDIAC CATHETERIZATION  01/2013  . CATARACT EXTRACTION, BILATERAL    . CHOLECYSTECTOMY    . COLONOSCOPY    . CORONARY ANGIOPLASTY  2004  . EYE SURGERY Bilateral    cataracts  . IR GENERIC HISTORICAL  10/05/2015   IR US GUIDE VASC ACCESS RIGHT 10/05/2015 Marybelle Killings, MD [facility]INTER[facility] RAD  . IR GENERIC HISTORICAL  10/05/2015   IR FLUORO GUIDE PORT INSERTION RIGHT 10/05/2015 Marybelle Killings, MD [facility]  . LEFT HEART CATHETERIZATION WITH CORONARY ANGIOGRAM N/A 06/18/2011   Procedure: LEFT HEART CATHETERIZATION WITH CORONARY ANGIOGRAM;  Surgeon: Peter M Martinique, MD;  Location: [facility] CATH LAB;  Service:  Cardiovascular;  Laterality: N/A;  . LEFT HEART CATHETERIZATION WITH CORONARY ANGIOGRAM N/A 01/27/2013   Procedure: LEFT HEART CATHETERIZATION WITH CORONARY ANGIOGRAM;  Surgeon: Burnell Blanks, MD;  Location: [facility] CATH LAB;  Service: Cardiovascular;  Laterality: N/A;  . LUMBAR LAMINECTOMY/DECOMPRESSION MICRODISCECTOMY N/A 02/07/2015   Procedure: Lumbar three-Sacral one Decompression;  Surgeon: Kevan Ny Ditty, MD;  Location: [place] NEURO ORS;  Service: Neurosurgery;  Laterality: N/A;  L3 to S1 Decompression  . MULTIPLE TOOTH EXTRACTIONS    . ORCHIECTOMY Right 09/01/2015   Procedure: RIGHT ORCHIECTOMY;  Surgeon: Kathie Rhodes, MD;  Location: [facility] ORS;  Service: Urology;  Laterality: Right;  . ROTATOR CUFF REPAIR Left     Family History  Problem Relation Age of Onset  . Alzheimer's disease Mother   . Heart disease Mother   . Heart disease Father   . Migraines Father   . Ulcers Father   . Prostate cancer Brother   . Coronary artery disease      Male 1st degree relative <50  . Coronary artery disease      male 1st degree relative <60  . Heart disease Sister   . Obesity Sister     Morbid  . Arthritis Sister   . Heart disease Brother   . Arthritis Brother   . Sleep apnea Son   . Obesity Son   . Migraines Daughter   . Thyroid disease Daughter     Allergies  Allergen Reactions  . Ace Inhibitors Other (See Comments)    cough  . Codeine Nausea Only and Rash       . Penicillins Rash    Childhood allergy Has patient had a PCN reaction causing immediate rash, facial/tongue/throat swelling, SOB or lightheadedness with hypotension: Yes Has patient had a PCN reaction causing severe rash involving mucus membranes or skin necrosis: Yes Has patient had a PCN reaction that required hospitalization No Has patient had a PCN reaction occurring within the last 10 years: No If all of the above answers are "NO", then may proceed with Cephalosporin use.     Current Outpatient  Prescriptions on File Prior to Visit  Medication Sig Dispense Refill  . ACCU-CHEK AVIVA PLUS test strip TEST 3 TIMES A DAY (Patient taking differently: TEST 2 TIMES A DAY) 300 each 5  . acetaminophen (TYLENOL) 500 MG tablet Take 1,000 mg by mouth every 6 (six) hours as needed (for pain/fever/headaches.).     Marland Kitchen atorvastatin (LIPITOR) 40 MG tablet TAKE 1 TABLET BY MOUTH DAILY 90 tablet 2  . clonazePAM (KLONOPIN) 1 MG tablet TAKE 1 TABLET EVERY DAY AT BEDTIME AS NEEDED 30 tablet 5  . clopidogrel (PLAVIX) 75 MG tablet TAKE 1 TABLET BY MOUTH DAILY 30 tablet 8  .  docusate sodium (COLACE) 50 MG capsule Take 50 mg by mouth 2 (two) times daily as needed (for constipation).    . furosemide (LASIX) 40 MG tablet TAKE 1 TABLET BY MOUTH EVERY DAY (Patient taking differently: Take 40 mg by mouth once a day as needed for edema) 30 tablet 1  . gabapentin (NEURONTIN) 300 MG capsule TAKE 2 CAPSULES BY MOUTH 3 TIMES A DAY 180 capsule 2  . Insulin Glargine (TOUJEO SOLOSTAR) 300 UNIT/ML SOPN Inject 20 Units into the skin daily after breakfast. 1 pen 0  . insulin lispro (HUMALOG) 100 UNIT/ML injection Inject 4 Units into the skin daily with breakfast.     . metFORMIN (GLUCOPHAGE) 500 MG tablet TAKE 1 TABLET BY MOUTH TWICE A DAY WITH A MEAL 60 tablet 5  . metoprolol succinate (TOPROL-XL) 100 MG 24 hr tablet TAKE 1 TABLET EVERY DAY IMMEDIATELY FOLLOWING A MEAL 90 tablet 1  . Multiple Vitamins-Minerals (CENTRUM SILVER 50+MEN) TABS Take 1 tablet by mouth daily.    . nitroGLYCERIN (NITROSTAT) 0.4 MG SL tablet Place 1 tablet (0.4 mg total) under the tongue every 5 (five) minutes as needed. Chest pain 25 tablet 6  . oxybutynin (DITROPAN XL) 15 MG 24 hr tablet Take 15 mg by mouth every morning.    . pantoprazole (PROTONIX) 40 MG tablet TAKE 1 TABLET BY MOUTH EVERY DAY 90 tablet 3  . pramipexole (MIRAPEX) 1.5 MG tablet Take 1.5-2.25 mg by mouth at bedtime.    . tamsulosin (FLOMAX) 0.4 MG CAPS capsule Take 0.4 mg by mouth at  bedtime.    . Trospium Chloride 60 MG CP24 Take 60 mg by mouth daily.    . vitamin B-12 (CYANOCOBALAMIN) 1000 MCG tablet Take 1,000 mcg by mouth daily.    Marland Kitchen warfarin (COUMADIN) 5 MG tablet TAKE AS DIRECTED (Patient taking differently: Take 7.5 mg by mouth at bedtime on Sun/Tues/Wed/Thurs/Sat and 10 mg on Mon/Fri) 40 tablet 1  . [DISCONTINUED] metoprolol (LOPRESSOR) 50 MG tablet     . [DISCONTINUED] spironolactone (ALDACTONE) 25 MG tablet      Current Facility-Administered Medications on File Prior to Visit  Medication Dose Route Frequency Provider Last Rate Last Dose  . testosterone cypionate (DEPOTESTOTERONE CYPIONATE) injection 200 mg  200 mg Intramuscular Q28 days [name], MD   200 mg at 12/26/11 0850  . testosterone cypionate (DEPOTESTOTERONE CYPIONATE) injection 200 mg  200 mg Intramuscular Q28 days [name], MD   200 mg at 01/23/12 0908  . testosterone cypionate (DEPOTESTOTERONE CYPIONATE) injection 200 mg  200 mg Intramuscular Q28 days [name], MD   200 mg at 02/25/12 0848  . testosterone cypionate (DEPOTESTOTERONE CYPIONATE) injection 200 mg  200 mg Intramuscular Q28 days [name], MD   200 mg at 06/03/12 1437    BP 100/60 (BP Location: Left Arm, Patient Position: Sitting, Cuff Size: Large)   Pulse 72   Temp 97.6 F (36.4 C) (Oral)   Wt 234 lb 6.4 oz (106.3 kg)   SpO2 96%   BMI 33.63 kg/m        Objective:   Physical Exam  Constitutional: He is oriented to person, place, and time.  Obese male who ambulates with a walker  Neck: Neck supple.  Cardiovascular: Normal rate and regular rhythm.   Capillary refill present and <2 seconds bilaterally  Pulmonary/Chest: Effort normal and breath sounds normal. He has no wheezes. He has no rales.  Abdominal: Soft. Bowel sounds are normal.  Musculoskeletal:  2+ edema  noted in feet bilaterally  Lymphadenopathy:    He has no cervical adenopathy.  Neurological: He is alert and oriented to person,  place, and time.  Skin: Skin is warm and dry. No rash noted.  Right foot exhibits mild erythema, warmth, and 2+ nonpitting edema. Left foot also exhibits 2+ nonpitting edema. Capillary refill good; Purple discoloration noted on right foot across toes. History of anticoagulant use  Psychiatric: He has a normal mood and affect. His behavior is normal. Judgment and thought content normal.               Assessment & Plan:  1. Cellulitis of right lower extremity Lower extremity edema, warmth, and erythema present; will treat cellulitis and advised close follow in 2 days for further evaluation and treatment.  Reviewed options for treatment including Augmentin and Keflex however patient has an allergy to PCN with hives and cannot state any further information and has no history of use of Keflex. Will provide clindamycin and advised close follow up in 2 days with PCP or sooner if needed. We reviewed warning signs that require medical attention including symptoms worsening, red streaks that extend from foot or area of erythema increases or turns darker, or he develop N/V/ or loose stools including watery diarrhea. Reviewed this plan with Dr. Elease Hashimoto. Patient has agreed to follow up with Dr. Elease Hashimoto in 2 days - clindamycin (CLEOCIN) 300 MG capsule; Take 1 capsule (300 mg total) by mouth 3 (three) times daily.  Dispense: 15 capsule; Refill: 0   2. Bilateral swelling of feet Good capillary refill; chronic lower extremity edema, feet are not cool to touch but are warm, difficulty with palpating distal pulses due to edema present  3. Essential hypertension Controlled with current regimen and addition of metoprolol by PCP  Provided written instructions for home care of cellulitis; follow up in 2 days with PCP.  Gary Metz, FNP-C

## 2016-04-09 ENCOUNTER — Other Ambulatory Visit: Payer: Self-pay | Admitting: Family Medicine

## 2016-04-09 ENCOUNTER — Encounter (HOSPITAL_COMMUNITY): Admission: RE | Payer: Self-pay | Source: Ambulatory Visit

## 2016-04-09 ENCOUNTER — Ambulatory Visit (HOSPITAL_COMMUNITY): Admission: RE | Admit: 2016-04-09 | Payer: Medicare Other | Source: Ambulatory Visit | Admitting: Neurological Surgery

## 2016-04-09 SURGERY — LUMBAR LAMINECTOMY/DECOMPRESSION MICRODISCECTOMY 1 LEVEL
Anesthesia: General | Laterality: Right

## 2016-04-10 ENCOUNTER — Ambulatory Visit (INDEPENDENT_AMBULATORY_CARE_PROVIDER_SITE_OTHER): Payer: Medicare Other | Admitting: Family Medicine

## 2016-04-10 ENCOUNTER — Encounter: Payer: Self-pay | Admitting: Family Medicine

## 2016-04-10 VITALS — BP 120/60 | HR 72 | Temp 98.0°F | Wt 236.2 lb

## 2016-04-10 DIAGNOSIS — L03115 Cellulitis of right lower limb: Secondary | ICD-10-CM | POA: Diagnosis not present

## 2016-04-10 NOTE — Progress Notes (Signed)
Subjective:     Patient ID: Gary Yates., male   DOB: [birth year], 83 y.o.   MRN: [id number]  HPI Patient here for follow-up regarding cellulitis right foot and bilateral leg edema. Note reviewed from 2 days ago. He had noticed some increased swelling over the weekend and by Sunday had some redness of the right foot. No recent injury. He had some bruising over the toes distally but again no recollection of any injury. He takes Coumadin and Plavix. He was being scheduled for elective back surgery and that was postponed because of the cellulitis. He has never had any fevers or chills. He was started on clindamycin 300 milligrams 3 times a day. History of penicillin allergy. They think his redness is slightly improved. Certainly no worse No diarrhea or other side effects from Clindamycin.   Past Medical History:  Diagnosis Date  . Anxiety   . Atrial fibrillation ([place])   . COPD (chronic obstructive pulmonary disease) ([place])    pt. denies  . Coronary artery disease    a. h/o Overlapping stents RCA;  b. 06/2011 Cath: patent stents, nonobs dzs, NL EF.  . Diabetic peripheral neuropathy ([place])   . Diffuse non-Hodgkin's lymphoma of testis ([place]) 09/28/2015  . DM (diabetes mellitus) ([place])    Type 2, peripheral neuropathy.  . Dyspnea    with exertion  . Dysrhythmia   . GERD (gastroesophageal reflux disease)   . Headache   . History of bronchitis   . History of kidney stones   . Hyperlipidemia   . Hypertension   . Low testosterone   . Nephrolithiasis   . Osteoarthritis    shoulder  . Restless leg   . SVT (supraventricular tachycardia) ([place])   . Urinary frequency   . Wears partial dentures    upper and lower   Past Surgical History:  Procedure Laterality Date  . APPENDECTOMY    . [place]  . CARDIAC CATHETERIZATION  01/2013  . CATARACT EXTRACTION, BILATERAL    . CHOLECYSTECTOMY    . COLONOSCOPY    . CORONARY ANGIOPLASTY  2004  . EYE SURGERY Bilateral    cataracts  .  IR GENERIC HISTORICAL  10/05/2015   IR US GUIDE VASC ACCESS RIGHT 10/05/2015 Marybelle Killings, MD [facility]  . IR GENERIC HISTORICAL  10/05/2015   IR FLUORO GUIDE PORT INSERTION RIGHT 10/05/2015 Marybelle Killings, MD [facility]  . LEFT HEART CATHETERIZATION WITH CORONARY ANGIOGRAM N/A 06/18/2011   Procedure: LEFT HEART CATHETERIZATION WITH CORONARY ANGIOGRAM;  Surgeon: Peter M Martinique, MD;  Location: [facility] CATH LAB;  Service: Cardiovascular;  Laterality: N/A;  . LEFT HEART CATHETERIZATION WITH CORONARY ANGIOGRAM N/A 01/27/2013   Procedure: LEFT HEART CATHETERIZATION WITH CORONARY ANGIOGRAM;  Surgeon: Burnell Blanks, MD;  Location: [facility] CATH LAB;  Service: Cardiovascular;  Laterality: N/A;  . LUMBAR LAMINECTOMY/DECOMPRESSION MICRODISCECTOMY N/A 02/07/2015   Procedure: Lumbar three-Sacral one Decompression;  Surgeon: Kevan Ny Ditty, MD;  Location: [place] NEURO ORS;  Service: Neurosurgery;  Laterality: N/A;  L3 to S1 Decompression  . MULTIPLE TOOTH EXTRACTIONS    . ORCHIECTOMY Right 09/01/2015   Procedure: RIGHT ORCHIECTOMY;  Surgeon: Kathie Rhodes, MD;  Location: [facility] ORS;  Service: Urology;  Laterality: Right;  . ROTATOR CUFF REPAIR Left     reports that he quit smoking about 38 years ago. His smoking use included Cigarettes. He has a 30.00 pack-year smoking history. He has never used smokeless tobacco. He reports that he does not drink alcohol  or use drugs. family history includes Alzheimer's disease in his mother; Arthritis in his brother and sister; Heart disease in his brother, father, mother, and sister; Migraines in his daughter and father; Obesity in his sister and son; Prostate cancer in his brother; Sleep apnea in his son; Thyroid disease in his daughter; Ulcers in his father.    Review of Systems  Constitutional: Negative for chills and fever.  Respiratory: Negative for shortness of breath.   Cardiovascular: Positive for leg swelling.       Objective:   Physical Exam  Constitutional: He  appears well-developed and well-nourished.  Cardiovascular: Normal rate and regular rhythm.   Pulmonary/Chest: Effort normal and breath sounds normal. No respiratory distress. He has no wheezes. He has no rales.  Musculoskeletal:  Only trace pitting edema of lower legs.  Skin:  Patient has some erythema and slight warmth and slight tenderness over the dorsal lateral aspect of the right foot. No extension to the ankle or leg. He has some diffuse ecchymosis involving second through fifth toes on the right foot but no break in skin noted. No necrosis. Excellent cap refill. Foot is warm to touch.       Assessment:     Cellulitis changes right foot slightly improved on antibiotics    Plan:     -Continue clindamycin 300 mg 3 times a day -Continue frequent foot elevation -Avoid any icing -Reassess in 5 days and consider repeat pro time then-especially while on antibiotics -follow up sooner for any fever or progressive redness or edema.  [name] MD [facility] at [facility]

## 2016-04-10 NOTE — Patient Instructions (Signed)

## 2016-04-10 NOTE — Progress Notes (Signed)
Pre visit review using our clinic review tool, if applicable. No additional management support is needed unless otherwise documented below in the visit note.  Samples of this drug were given to the patient, quantity 1 pen, Lot Number [id number] exp 30/11/19.  toujeo

## 2016-04-15 ENCOUNTER — Ambulatory Visit (INDEPENDENT_AMBULATORY_CARE_PROVIDER_SITE_OTHER): Payer: Medicare Other | Admitting: Family Medicine

## 2016-04-15 ENCOUNTER — Ambulatory Visit (INDEPENDENT_AMBULATORY_CARE_PROVIDER_SITE_OTHER): Payer: Medicare Other | Admitting: General Practice

## 2016-04-15 ENCOUNTER — Encounter: Payer: Self-pay | Admitting: Family Medicine

## 2016-04-15 VITALS — BP 130/68 | HR 60 | Temp 97.7°F

## 2016-04-15 DIAGNOSIS — I480 Paroxysmal atrial fibrillation: Secondary | ICD-10-CM | POA: Diagnosis not present

## 2016-04-15 DIAGNOSIS — L03115 Cellulitis of right lower limb: Secondary | ICD-10-CM

## 2016-04-15 LAB — POCT INR: INR: 1.5

## 2016-04-15 MED ORDER — CEPHALEXIN 500 MG PO CAPS: 500.0000 mg | ORAL_CAPSULE | Freq: Four times a day (QID) | ORAL | 0 refills | Status: DC

## 2016-04-15 NOTE — Progress Notes (Signed)
Subjective:     Patient ID: Gary Yates., male   DOB: July 15, 1933, 83 y.o.   MRN: [id number]  HPI Follow-up cellulitis right foot. Patient was placed on clindamycin last week. His foot is doing some better but he started having severe heartburn yesterday which he attributed to the clindamycin. He refuses to take further doses at this time. No fevers or chills. He's had some improvement in edema and erythema has declined somewhat. Somewhat less painful.  Past Medical History:  Diagnosis Date  . Anxiety   . Atrial fibrillation ([place])   . COPD (chronic obstructive pulmonary disease) ([place])    pt. denies  . Coronary artery disease    a. h/o Overlapping stents RCA;  b. 06/2011 Cath: patent stents, nonobs dzs, NL EF.  . Diabetic peripheral neuropathy ([place])   . Diffuse non-Hodgkin's lymphoma of testis ([place]) 09/28/2015  . DM (diabetes mellitus) ([place])    Type 2, peripheral neuropathy.  . Dyspnea    with exertion  . Dysrhythmia   . GERD (gastroesophageal reflux disease)   . Headache   . History of bronchitis   . History of kidney stones   . Hyperlipidemia   . Hypertension   . Low testosterone   . Nephrolithiasis   . Osteoarthritis    shoulder  . Restless leg   . SVT (supraventricular tachycardia) ([place])   . Urinary frequency   . Wears partial dentures    upper and lower   Past Surgical History:  Procedure Laterality Date  . APPENDECTOMY    . [place]  . CARDIAC CATHETERIZATION  01/2013  . CATARACT EXTRACTION, BILATERAL    . CHOLECYSTECTOMY    . COLONOSCOPY    . CORONARY ANGIOPLASTY  2004  . EYE SURGERY Bilateral    cataracts  . IR GENERIC HISTORICAL  10/05/2015   IR US GUIDE VASC ACCESS RIGHT 10/05/2015 Marybelle Killings, MD [facility]  . IR GENERIC HISTORICAL  10/05/2015   IR FLUORO GUIDE PORT INSERTION RIGHT 10/05/2015 Marybelle Killings, MD [facility]  . LEFT HEART CATHETERIZATION WITH CORONARY ANGIOGRAM N/A 06/18/2011   Procedure: LEFT HEART CATHETERIZATION WITH  CORONARY ANGIOGRAM;  Surgeon: Peter M Martinique, MD;  Location: [facility] CATH LAB;  Service: Cardiovascular;  Laterality: N/A;  . LEFT HEART CATHETERIZATION WITH CORONARY ANGIOGRAM N/A 01/27/2013   Procedure: LEFT HEART CATHETERIZATION WITH CORONARY ANGIOGRAM;  Surgeon: Burnell Blanks, MD;  Location: [facility] CATH LAB;  Service: Cardiovascular;  Laterality: N/A;  . LUMBAR LAMINECTOMY/DECOMPRESSION MICRODISCECTOMY N/A 02/07/2015   Procedure: Lumbar three-Sacral one Decompression;  Surgeon: Kevan Ny Ditty, MD;  Location: [place] NEURO ORS;  Service: Neurosurgery;  Laterality: N/A;  L3 to S1 Decompression  . MULTIPLE TOOTH EXTRACTIONS    . ORCHIECTOMY Right 09/01/2015   Procedure: RIGHT ORCHIECTOMY;  Surgeon: Kathie Rhodes, MD;  Location: [facility] ORS;  Service: Urology;  Laterality: Right;  . ROTATOR CUFF REPAIR Left     reports that he quit smoking about 38 years ago. His smoking use included Cigarettes. He has a 30.00 pack-year smoking history. He has never used smokeless tobacco. He reports that he does not drink alcohol or use drugs. family history includes Alzheimer's disease in his mother; Arthritis in his brother and sister; Heart disease in his brother, father, mother, and sister; Migraines in his daughter and father; Obesity in his sister and son; Prostate cancer in his brother; Sleep apnea in his son; Thyroid disease in his daughter; Ulcers in his father. Allergies  Allergen Reactions  .  Ace Inhibitors Other (See Comments)    cough  . Codeine Nausea Only and Rash       . Penicillins Rash    Childhood allergy Has patient had a PCN reaction causing immediate rash, facial/tongue/throat swelling, SOB or lightheadedness with hypotension: Yes Has patient had a PCN reaction causing severe rash involving mucus membranes or skin necrosis: Yes Has patient had a PCN reaction that required hospitalization No Has patient had a PCN reaction occurring within the last 10 years: No If all of the above answers are  "NO", then may proceed with Cephalosporin use.      Review of Systems  Constitutional: Negative for chills and fever.       Objective:   Physical Exam  Constitutional: He appears well-developed and well-nourished.  Cardiovascular: Normal rate.   Pulmonary/Chest: Effort normal and breath sounds normal. No respiratory distress. He has no wheezes. He has no rales.  Skin:  Right foot ecchymosis is starting to fade. He still has some mild erythema along the dorsal lateral aspect of the foot but this is improved compared to last week. Minimally warm to touch. Less tender.       Assessment:     Cellulitis right foot improving. Possible side effect from clindamycin    Plan:     -Discontinue clindamycin and start Keflex 500 mg 4 times a day for 7 days. He apparently has taken Omnicef in the past without difficulty -Continue frequent foot elevation -Reassess in one week and hopefully can approved for surgery at that point if continues to improve  [name] MD [facility]

## 2016-04-15 NOTE — Patient Instructions (Signed)
Stop the Clindamycin Start Keflex one four times daily Continue to elevate leg frequently.

## 2016-04-15 NOTE — Patient Instructions (Signed)
Pre visit review using our clinic review tool, if applicable. No additional management support is needed unless otherwise documented below in the visit note. 

## 2016-04-15 NOTE — Progress Notes (Signed)
Pre visit review using our clinic review tool, if applicable. No additional management support is needed unless otherwise documented below in the visit note. 

## 2016-04-22 ENCOUNTER — Ambulatory Visit (HOSPITAL_COMMUNITY)
Admission: RE | Admit: 2016-04-22 | Discharge: 2016-04-22 | Disposition: A | Discharge status: Home / Self Care | Payer: Medicare Other | Source: Ambulatory Visit | Attending: Hematology & Oncology | Admitting: Hematology & Oncology

## 2016-04-22 ENCOUNTER — Other Ambulatory Visit: Payer: Self-pay | Admitting: *Deleted

## 2016-04-22 DIAGNOSIS — I2510 Atherosclerotic heart disease of native coronary artery without angina pectoris: Secondary | ICD-10-CM | POA: Insufficient documentation

## 2016-04-22 DIAGNOSIS — C8589 Other specified types of non-Hodgkin lymphoma, extranodal and solid organ sites: Secondary | ICD-10-CM | POA: Insufficient documentation

## 2016-04-22 DIAGNOSIS — E279 Disorder of adrenal gland, unspecified: Secondary | ICD-10-CM | POA: Insufficient documentation

## 2016-04-22 DIAGNOSIS — N281 Cyst of kidney, acquired: Secondary | ICD-10-CM | POA: Insufficient documentation

## 2016-04-22 DIAGNOSIS — Z7901 Long term (current) use of anticoagulants: Secondary | ICD-10-CM

## 2016-04-22 DIAGNOSIS — I700 Atherosclerosis of aorta: Secondary | ICD-10-CM | POA: Insufficient documentation

## 2016-04-22 LAB — GLUCOSE, CAPILLARY: Glucose-Capillary: 135 mg/dL — ABNORMAL HIGH (ref 65–99)

## 2016-04-22 MED ORDER — DOCUSATE SODIUM 50 MG PO CAPS: 50.0000 mg | ORAL_CAPSULE | Freq: Two times a day (BID) | ORAL | 0 refills | Status: DC | PRN

## 2016-04-22 MED ORDER — FLUDEOXYGLUCOSE F - 18 (FDG) INJECTION: 11.1000 | Freq: Once | INTRAVENOUS | Status: DC | PRN

## 2016-04-23 ENCOUNTER — Other Ambulatory Visit: Payer: Self-pay | Admitting: Family

## 2016-04-23 DIAGNOSIS — R600 Localized edema: Secondary | ICD-10-CM

## 2016-04-24 ENCOUNTER — Ambulatory Visit: Payer: Medicare Other

## 2016-04-24 ENCOUNTER — Ambulatory Visit: Payer: Medicare Other | Admitting: Family Medicine

## 2016-04-25 ENCOUNTER — Telehealth: Payer: Self-pay | Admitting: Cardiovascular Disease

## 2016-04-25 ENCOUNTER — Other Ambulatory Visit: Payer: Self-pay | Admitting: Hematology & Oncology

## 2016-04-25 DIAGNOSIS — C8333 Diffuse large B-cell lymphoma, intra-abdominal lymph nodes: Secondary | ICD-10-CM

## 2016-04-26 ENCOUNTER — Telehealth: Payer: Self-pay | Admitting: General Practice

## 2016-04-26 ENCOUNTER — Other Ambulatory Visit: Payer: Self-pay | Admitting: Family

## 2016-04-26 NOTE — Telephone Encounter (Signed)
Hi Dr. Elease Hashimoto,  I wanted to let you know that Gary Yates called this morning to let me know that his PET scan showed a suspicious spot on his adrenal gland.  Dr. Marin Olp wants to do a biopsy possibily next Wednesday.  I cancelled his appointment with you next week.  He said he will reschedule.  Dr. Marin Olp has stopped his coumadin for a few days.  Villa Herb, RN

## 2016-04-26 NOTE — Telephone Encounter (Signed)
Thanks for the update

## 2016-04-29 ENCOUNTER — Other Ambulatory Visit: Payer: Self-pay | Admitting: Student

## 2016-04-29 ENCOUNTER — Other Ambulatory Visit (HOSPITAL_BASED_OUTPATIENT_CLINIC_OR_DEPARTMENT_OTHER): Payer: Medicare Other

## 2016-04-29 ENCOUNTER — Ambulatory Visit (HOSPITAL_BASED_OUTPATIENT_CLINIC_OR_DEPARTMENT_OTHER): Payer: Medicare Other

## 2016-04-29 ENCOUNTER — Ambulatory Visit (HOSPITAL_BASED_OUTPATIENT_CLINIC_OR_DEPARTMENT_OTHER): Payer: Medicare Other | Admitting: Hematology & Oncology

## 2016-04-29 VITALS — BP 141/57 | HR 72 | Temp 98.0°F | Resp 18 | Wt 239.0 lb

## 2016-04-29 DIAGNOSIS — C8589 Other specified types of non-Hodgkin lymphoma, extranodal and solid organ sites: Secondary | ICD-10-CM

## 2016-04-29 DIAGNOSIS — Z95828 Presence of other vascular implants and grafts: Secondary | ICD-10-CM

## 2016-04-29 DIAGNOSIS — C8339 Diffuse large B-cell lymphoma, extranodal and solid organ sites: Secondary | ICD-10-CM | POA: Diagnosis not present

## 2016-04-29 DIAGNOSIS — Z7901 Long term (current) use of anticoagulants: Secondary | ICD-10-CM

## 2016-04-29 DIAGNOSIS — D3500 Benign neoplasm of unspecified adrenal gland: Secondary | ICD-10-CM

## 2016-04-29 DIAGNOSIS — B3749 Other urogenital candidiasis: Secondary | ICD-10-CM

## 2016-04-29 LAB — CMP (CANCER CENTER ONLY)
ALBUMIN: 3.4 g/dL (ref 3.3–5.5)
ALT(SGPT): 13 U/L (ref 10–47)
AST: 23 U/L (ref 11–38)
Alkaline Phosphatase: 102 U/L — ABNORMAL HIGH (ref 26–84)
BUN, Bld: 19 mg/dL (ref 7–22)
CALCIUM: 9.4 mg/dL (ref 8.0–10.3)
CHLORIDE: 97 meq/L — AB (ref 98–108)
CO2: 30 meq/L (ref 18–33)
CREATININE: 1.1 mg/dL (ref 0.6–1.2)
Glucose, Bld: 119 mg/dL — ABNORMAL HIGH (ref 73–118)
Potassium: 3.7 mEq/L (ref 3.3–4.7)
Sodium: 137 mEq/L (ref 128–145)
Total Bilirubin: 0.6 mg/dl (ref 0.20–1.60)
Total Protein: 6.4 g/dL (ref 6.4–8.1)

## 2016-04-29 LAB — CBC WITH DIFFERENTIAL (CANCER CENTER ONLY)
BASO#: 0 10*3/uL (ref 0.0–0.2)
BASO%: 0.3 % (ref 0.0–2.0)
EOS ABS: 0.2 10*3/uL (ref 0.0–0.5)
EOS%: 2.4 % (ref 0.0–7.0)
HEMATOCRIT: 33.7 % — AB (ref 38.7–49.9)
HEMOGLOBIN: 11.3 g/dL — AB (ref 13.0–17.1)
LYMPH#: 1.2 10*3/uL (ref 0.9–3.3)
LYMPH%: 17.4 % (ref 14.0–48.0)
MCH: 29.7 pg (ref 28.0–33.4)
MCHC: 33.5 g/dL (ref 32.0–35.9)
MCV: 89 fL (ref 82–98)
MONO#: 0.6 10*3/uL (ref 0.1–0.9)
MONO%: 8.2 % (ref 0.0–13.0)
NEUT#: 4.8 10*3/uL (ref 1.5–6.5)
NEUT%: 71.7 % (ref 40.0–80.0)
PLATELETS: 145 10*3/uL (ref 145–400)
RBC: 3.8 10*6/uL — AB (ref 4.20–5.70)
RDW: 14.1 % (ref 11.1–15.7)
WBC: 6.7 10*3/uL (ref 4.0–10.0)

## 2016-04-29 LAB — PROTIME-INR (CHCC SATELLITE)
INR: 1.2 — AB (ref 2.0–3.5)
PROTIME: 14.4 s — AB (ref 10.6–13.4)

## 2016-04-29 LAB — LACTATE DEHYDROGENASE: LDH: 205 U/L (ref 125–245)

## 2016-04-29 MED ORDER — SODIUM CHLORIDE 0.9% FLUSH
10.0000 mL | INTRAVENOUS | Status: DC | PRN
  Administered 2016-04-29: 10 mL via INTRAVENOUS
  Filled 2016-04-29: qty 10

## 2016-04-29 MED ORDER — HEPARIN SOD (PORK) LOCK FLUSH 100 UNIT/ML IV SOLN
500.0000 [IU] | Freq: Once | INTRAVENOUS | Status: AC
  Administered 2016-04-29: 500 [IU] via INTRAVENOUS
  Filled 2016-04-29: qty 5

## 2016-04-29 MED ORDER — FLUCONAZOLE 100 MG PO TABS: 100.0000 mg | ORAL_TABLET | Freq: Every day | ORAL | 0 refills | Status: DC

## 2016-04-29 NOTE — Progress Notes (Signed)
Hematology and Oncology Follow Up Visit  Ahad Colarusso [id number] 19-Feb-1933 83 y.o. 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 Hollibaugh is back for follow-up. Unfortunately, it looks like he has relapsed disease. He had a PET scan done for routine follow-up. The PET scan showed marked activity and enlargement of a left adrenal mass. It now measures 4.5 x 3 cm. It is quite active with a SUV of 24. Of note, this is the one area outside of his right testicle that had activity. This resolved with treatment.  He finishes radiation treatments. He had a tough time with the radiation. This surprises me somewhat.  He has an incredibly bad lower back. He was supposed to have back surgery but had cellulitis develop in his feet. I'm not sure exactly why he would have developed cellulitis. This seems to be doing better from my point of view.  He's had no abdominal pain. He does have diabetes. Apparently still is not sleeping all that well. He cannot lie down flat because of his back.  His appetite is doing okay. He did have a nice [place] with his family.  Overall, I would say that his performance status is ECOG 1-2.  Medications:  Current Outpatient Prescriptions:  .  ACCU-CHEK AVIVA PLUS test strip, TEST 3 TIMES A DAY (Patient taking differently: TEST 2 TIMES A DAY), Disp: 300 each, Rfl: 5 .  acetaminophen (TYLENOL) 500 MG tablet, Take 1,000 mg by mouth every 6 (six) hours as needed (for pain/fever/headaches.). , Disp: , Rfl:  .  atorvastatin (LIPITOR) 40 MG tablet, TAKE 1 TABLET BY MOUTH DAILY, Disp: 90 tablet, Rfl: 2 .  cephALEXin (KEFLEX) 500 MG capsule, Take 1 capsule (500 mg total) by mouth 4 (four) times daily., Disp: 28 capsule, Rfl: 0 .  clonazePAM (KLONOPIN) 1 MG tablet, TAKE 1 TABLET EVERY DAY AT BEDTIME AS  NEEDED, Disp: 30 tablet, Rfl: 5 .  clopidogrel (PLAVIX) 75 MG tablet, TAKE 1 TABLET BY MOUTH DAILY, Disp: 30 tablet, Rfl: 8 .  docusate sodium (COLACE) 50 MG capsule, Take 1 capsule (50 mg total) by mouth 2 (two) times daily as needed (for constipation)., Disp: 90 capsule, Rfl: 0 .  fluconazole (DIFLUCAN) 100 MG tablet, Take 1 tablet (100 mg total) by mouth daily., Disp: 7 tablet, Rfl: 0 .  furosemide (LASIX) 40 MG tablet, TAKE 1 TABLET BY MOUTH EVERY DAY, Disp: 30 tablet, Rfl: 1 .  gabapentin (NEURONTIN) 300 MG capsule, TAKE 2 CAPSULES BY MOUTH 3 TIMES A DAY, Disp: 180 capsule, Rfl: 2 .  Insulin Glargine (TOUJEO SOLOSTAR) 300 UNIT/ML SOPN, Inject 20 Units into the skin daily after breakfast., Disp: 1 pen, Rfl: 0 .  insulin lispro (HUMALOG) 100 UNIT/ML injection, Inject 4 Units into the skin daily with breakfast. , Disp: , Rfl:  .  metFORMIN (GLUCOPHAGE) 500 MG tablet, TAKE 1 TABLET BY MOUTH TWICE A DAY WITH A MEAL, Disp: 60 tablet, Rfl: 5 .  metoprolol succinate (TOPROL-XL) 100 MG 24 hr tablet, TAKE 1 TABLET EVERY DAY IMMEDIATELY FOLLOWING A MEAL, Disp: 90 tablet, Rfl: 1 .  Multiple Vitamins-Minerals (CENTRUM SILVER 50+MEN) TABS, Take 1 tablet by mouth daily., Disp: , Rfl:  .  nitroGLYCERIN (NITROSTAT) 0.4 MG SL tablet, Place 1 tablet (0.4 mg total) under the tongue every 5 (  five) minutes as needed. Chest pain, Disp: 25 tablet, Rfl: 6 .  oxybutynin (DITROPAN XL) 15 MG 24 hr tablet, Take 15 mg by mouth every morning., Disp: , Rfl:  .  pantoprazole (PROTONIX) 40 MG tablet, TAKE 1 TABLET BY MOUTH EVERY DAY, Disp: 90 tablet, Rfl: 3 .  pramipexole (MIRAPEX) 1.5 MG tablet, Take 1.5-2.25 mg by mouth at bedtime., Disp: , Rfl:  .  pramipexole (MIRAPEX) 1.5 MG tablet, MAY TAKE 1 TO 1 AND 1/2 TABLETS BY MOUTH AT BEDTIME, Disp: 135 tablet, Rfl: 2 .  tamsulosin (FLOMAX) 0.4 MG CAPS capsule, Take 0.4 mg by mouth at bedtime., Disp: , Rfl:  .  Trospium Chloride 60 MG CP24, Take 60 mg by mouth daily., Disp: , Rfl:    .  vitamin B-12 (CYANOCOBALAMIN) 1000 MCG tablet, Take 1,000 mcg by mouth daily., Disp: , Rfl:  .  warfarin (COUMADIN) 5 MG tablet, Take as directed by anticoagulation clinic, Disp: 60 tablet, Rfl: 1 No current facility-administered medications for this visit.   Facility-Administered Medications Ordered in Other Visits:  .  testosterone cypionate (DEPOTESTOTERONE CYPIONATE) injection 200 mg, 200 mg, Intramuscular, Q28 days, [name], MD, 200 mg at 12/26/11 0850 .  testosterone cypionate (DEPOTESTOTERONE CYPIONATE) injection 200 mg, 200 mg, Intramuscular, Q28 days, [name], MD, 200 mg at 01/23/12 0908 .  testosterone cypionate (DEPOTESTOTERONE CYPIONATE) injection 200 mg, 200 mg, Intramuscular, Q28 days, [name], MD, 200 mg at 02/25/12 0848 .  testosterone cypionate (DEPOTESTOTERONE CYPIONATE) injection 200 mg, 200 mg, Intramuscular, Q28 days, [name], MD, 200 mg at 06/03/12 1437  Allergies:  Allergies  Allergen Reactions  . Ace Inhibitors Other (See Comments)    cough  . Codeine Nausea Only and Rash       . Penicillins Rash    Childhood allergy Has patient had a PCN reaction causing immediate rash, facial/tongue/throat swelling, SOB or lightheadedness with hypotension: Yes Has patient had a PCN reaction causing severe rash involving mucus membranes or skin necrosis: Yes Has patient had a PCN reaction that required hospitalization No Has patient had a PCN reaction occurring within the last 10 years: No If all of the above answers are "NO", then may proceed with Cephalosporin use.     Past Medical History, Surgical history, Social history, and Family History were reviewed and updated.  Review of Systems:  As above  Physical Exam:  weight is 239 lb (108.4 kg). His oral temperature is 98 F (36.7 C). His blood pressure is 141/57 (abnormal) and his pulse is 72. His respiration is 18 and oxygen saturation is 99%.   Wt Readings from Last 3  Encounters:  04/29/16 239 lb (108.4 kg)  04/10/16 236 lb 3.2 oz (107.1 kg)  04/08/16 234 lb 6.4 oz (106.3 kg)      Elderly white male. He started to lose his hair. Head and neck exam shows no ocular or oral lesions. There are no palpable cervical or supraclavicular lymph nodes. He has no oral thrush. There is no oral mucositis. There is no palpable adenopathy in the neck. Lungs are clear to percussion and auscultation bilaterally. Cardiac exam regular rate and rhythm consistent with atrial fibrillation. He has no murmurs, rubs or bruits. Abdomen is soft. He has decent bowel sounds. There is no fluid wave. There is no guarding or rebound tenderness. He has no palpable hepatosplenomegaly. He has a healed right inguinal orchiectomy scar. Extremities shows chronic 1+ edema in his legs. He has some stasis dermatitis  changes in his legs. Neurological exam shows no focal neurological deficits. Skin exam shows some brawny edema in his lower legs.  Lab Results  Component Value Date   WBC 6.7 04/29/2016   HGB 11.3 (L) 04/29/2016   HCT 33.7 (L) 04/29/2016   MCV 89 04/29/2016   PLT 145 04/29/2016     Chemistry      Component Value Date/Time   NA 137 04/29/2016 1110   NA 137 12/14/2015 1100   K 3.7 04/29/2016 1110   K 3.7 12/14/2015 1100   CL 97 (L) 04/29/2016 1110   CO2 30 04/29/2016 1110   CO2 24 12/14/2015 1100   BUN 19 04/29/2016 1110   BUN 18.4 12/14/2015 1100   CREATININE 1.1 04/29/2016 1110   CREATININE 0.9 12/14/2015 1100      Component Value Date/Time   CALCIUM 9.4 04/29/2016 1110   CALCIUM 9.5 12/14/2015 1100   ALKPHOS 102 (H) 04/29/2016 1110   ALKPHOS 102 12/14/2015 1100   AST 23 04/29/2016 1110   AST 22 12/14/2015 1100   ALT 13 04/29/2016 1110   ALT 10 12/14/2015 1100   BILITOT 0.60 04/29/2016 1110   BILITOT 0.58 12/14/2015 1100         Impression and Plan: Mr. Matusek is an 83 year old white male. He has testicular lymphoma. He is on systemic chemotherapy. He has a  marginal performance status. As such, we can really cannot be all that aggressive with him.    Unfortunately, it looks like we probably have recurrent disease. The left adrenal tumor I suspect is lymphoma. This was present when we first started treating him.  We will see what the biopsy shows. This will definitely show [place] if there is recurrence.  I cannot imagine that this is anything other then lymphoma.  If this is truly lymphoma, then I would consider treating him with Rituxan/bendamustine. I don't believe he is a candidate for anything aggressive like R-ICE.   We will get him back once we had the results from the biopsy.   I spent about 30 minutes with he and his family.  Again, he has never had a great performance status that we could be aggressive with treating him.  Volanda Napoleon, MD 4/23/20181:18 PM

## 2016-04-30 ENCOUNTER — Ambulatory Visit (HOSPITAL_COMMUNITY)
Admission: RE | Admit: 2016-04-30 | Discharge: 2016-04-30 | Disposition: A | Discharge status: Home / Self Care | Payer: Medicare Other | Source: Ambulatory Visit | Attending: Hematology & Oncology | Admitting: Hematology & Oncology

## 2016-04-30 ENCOUNTER — Encounter (HOSPITAL_COMMUNITY): Payer: Self-pay

## 2016-04-30 DIAGNOSIS — Z9221 Personal history of antineoplastic chemotherapy: Secondary | ICD-10-CM | POA: Diagnosis not present

## 2016-04-30 DIAGNOSIS — Z7901 Long term (current) use of anticoagulants: Secondary | ICD-10-CM | POA: Diagnosis not present

## 2016-04-30 DIAGNOSIS — M1990 Unspecified osteoarthritis, unspecified site: Secondary | ICD-10-CM | POA: Insufficient documentation

## 2016-04-30 DIAGNOSIS — I4891 Unspecified atrial fibrillation: Secondary | ICD-10-CM | POA: Diagnosis not present

## 2016-04-30 DIAGNOSIS — Z794 Long term (current) use of insulin: Secondary | ICD-10-CM | POA: Insufficient documentation

## 2016-04-30 DIAGNOSIS — E1142 Type 2 diabetes mellitus with diabetic polyneuropathy: Secondary | ICD-10-CM | POA: Insufficient documentation

## 2016-04-30 DIAGNOSIS — Z8572 Personal history of non-Hodgkin lymphomas: Secondary | ICD-10-CM | POA: Diagnosis not present

## 2016-04-30 DIAGNOSIS — I471 Supraventricular tachycardia: Secondary | ICD-10-CM | POA: Insufficient documentation

## 2016-04-30 DIAGNOSIS — G2581 Restless legs syndrome: Secondary | ICD-10-CM | POA: Diagnosis not present

## 2016-04-30 DIAGNOSIS — I2510 Atherosclerotic heart disease of native coronary artery without angina pectoris: Secondary | ICD-10-CM | POA: Diagnosis not present

## 2016-04-30 DIAGNOSIS — E785 Hyperlipidemia, unspecified: Secondary | ICD-10-CM | POA: Diagnosis not present

## 2016-04-30 DIAGNOSIS — I10 Essential (primary) hypertension: Secondary | ICD-10-CM | POA: Diagnosis not present

## 2016-04-30 DIAGNOSIS — J449 Chronic obstructive pulmonary disease, unspecified: Secondary | ICD-10-CM | POA: Diagnosis not present

## 2016-04-30 DIAGNOSIS — K219 Gastro-esophageal reflux disease without esophagitis: Secondary | ICD-10-CM | POA: Diagnosis not present

## 2016-04-30 DIAGNOSIS — F419 Anxiety disorder, unspecified: Secondary | ICD-10-CM | POA: Diagnosis not present

## 2016-04-30 DIAGNOSIS — C8333 Diffuse large B-cell lymphoma, intra-abdominal lymph nodes: Secondary | ICD-10-CM

## 2016-04-30 LAB — CBC
HCT: 31.6 % — ABNORMAL LOW (ref 39.0–52.0)
Hemoglobin: 11 g/dL — ABNORMAL LOW (ref 13.0–17.0)
MCH: 29.7 pg (ref 26.0–34.0)
MCHC: 34.8 g/dL (ref 30.0–36.0)
MCV: 85.4 fL (ref 78.0–100.0)
PLATELETS: 139 10*3/uL — AB (ref 150–400)
RBC: 3.7 MIL/uL — ABNORMAL LOW (ref 4.22–5.81)
RDW: 14.4 % (ref 11.5–15.5)
WBC: 5.6 10*3/uL (ref 4.0–10.5)

## 2016-04-30 LAB — APTT: aPTT: 33 seconds (ref 24–36)

## 2016-04-30 LAB — GLUCOSE, CAPILLARY: Glucose-Capillary: 125 mg/dL — ABNORMAL HIGH (ref 65–99)

## 2016-04-30 LAB — PROTIME-INR
INR: 1.16
Prothrombin Time: 14.9 seconds (ref 11.4–15.2)

## 2016-04-30 MED ORDER — HEPARIN SOD (PORK) LOCK FLUSH 100 UNIT/ML IV SOLN
500.0000 [IU] | INTRAVENOUS | Status: AC | PRN
  Administered 2016-04-30: 500 [IU]
  Filled 2016-04-30: qty 5

## 2016-04-30 MED ORDER — SODIUM CHLORIDE 0.9 % IV SOLN
INTRAVENOUS | Status: DC
  Administered 2016-04-30: 08:00:00 via INTRAVENOUS

## 2016-04-30 MED ORDER — FENTANYL CITRATE (PF) 100 MCG/2ML IJ SOLN
INTRAMUSCULAR | Status: AC | PRN
  Administered 2016-04-30: 50 ug via INTRAVENOUS

## 2016-04-30 MED ORDER — FLUMAZENIL 0.5 MG/5ML IV SOLN
INTRAVENOUS | Status: AC
  Filled 2016-04-30: qty 5

## 2016-04-30 MED ORDER — MIDAZOLAM HCL 2 MG/2ML IJ SOLN
INTRAMUSCULAR | Status: AC
  Filled 2016-04-30: qty 6

## 2016-04-30 MED ORDER — FENTANYL CITRATE (PF) 100 MCG/2ML IJ SOLN
INTRAMUSCULAR | Status: AC
  Filled 2016-04-30: qty 6

## 2016-04-30 MED ORDER — NALOXONE HCL 0.4 MG/ML IJ SOLN
INTRAMUSCULAR | Status: AC
  Filled 2016-04-30: qty 1

## 2016-04-30 MED ORDER — MIDAZOLAM HCL 2 MG/2ML IJ SOLN
INTRAMUSCULAR | Status: AC | PRN
  Administered 2016-04-30 (×2): 1 mg via INTRAVENOUS

## 2016-04-30 NOTE — H&P (Signed)
Referring Physician(s): Ennever,Peter R  Supervising Physician: Sandi Mariscal  Patient Status:  [facility] OP  Chief Complaint:  "I'm having a biopsy"  Subjective: Pt familiar to IR service from prior port a cath placement in 2017. He has a history of non-Hodgkin's lymphoma and is status post chemotherapy. Recent PET scan reveals significant interval enlargement/ hypermetabolic activity of the left adrenal gland lesion since prior PET from 2017. He presents today for CT-guided left adrenal nodule biopsy for further evaluation. He currently denies fever, headache, chest pain, dyspnea, cough, abdominal pain, nausea, vomiting or abnormal bleeding. He has had weight loss and back pain and is recovering from lower extremity cellulitis. Past Medical History:  Diagnosis Date  . Anxiety   . Atrial fibrillation ([place])   . COPD (chronic obstructive pulmonary disease) ([place])    pt. denies  . Coronary artery disease    a. h/o Overlapping stents RCA;  b. 06/2011 Cath: patent stents, nonobs dzs, NL EF.  . Diabetic peripheral neuropathy ([place])   . Diffuse non-Hodgkin's lymphoma of testis ([place]) 09/28/2015  . DM (diabetes mellitus) ([place])    Type 2, peripheral neuropathy.  . Dyspnea    with exertion  . Dysrhythmia   . GERD (gastroesophageal reflux disease)   . Headache   . History of bronchitis   . History of kidney stones   . Hyperlipidemia   . Hypertension   . Low testosterone   . Nephrolithiasis   . Osteoarthritis    shoulder  . Restless leg   . SVT (supraventricular tachycardia) ([place])   . Urinary frequency   . Wears partial dentures    upper and lower   Past Surgical History:  Procedure Laterality Date  . APPENDECTOMY    . [place]  . CARDIAC CATHETERIZATION  01/2013  . CATARACT EXTRACTION, BILATERAL    . CHOLECYSTECTOMY    . COLONOSCOPY    . CORONARY ANGIOPLASTY  2004  . EYE SURGERY Bilateral    cataracts  . IR GENERIC HISTORICAL  10/05/2015   IR US GUIDE VASC ACCESS  RIGHT 10/05/2015 Marybelle Killings, MD [facility]  . IR GENERIC HISTORICAL  10/05/2015   IR FLUORO GUIDE PORT INSERTION RIGHT 10/05/2015 Marybelle Killings, MD [facility]  . LEFT HEART CATHETERIZATION WITH CORONARY ANGIOGRAM N/A 06/18/2011   Procedure: LEFT HEART CATHETERIZATION WITH CORONARY ANGIOGRAM;  Surgeon: Peter M Martinique, MD;  Location: [facility] C[facility] LAB;  Service: Cardiovascular;  Laterality: N/A;  . LEFT HEART CATHETERIZATION WITH CORONARY ANGIOGRAM N/A 01/27/2013   Procedure: LEFT HEART CATHETERIZATION WITH CORONARY ANGIOGRAM;  Surgeon: Burnell Blanks, MD;  Location: [facility] CATH LAB;  Service: Cardiovascular;  Laterality: N/A;  . LUMBAR LAMINECTOMY/DECOMPRESSION MICRODISCECTOMY N/A 02/07/2015   Procedure: Lumbar three-Sacral one Decompression;  Surgeon: Kevan Ny Ditty, MD;  Location: [place] NEURO ORS;  Service: Neurosurgery;  Laterality: N/A;  L3 to S1 Decompression  . MULTIPLE TOOTH EXTRACTIONS    . ORCHIECTOMY Right 09/01/2015   Procedure: RIGHT ORCHIECTOMY;  Surgeon: Kathie Rhodes, MD;  Location: [facility];  Service: Urology;  Laterality: Right;  . ROTATOR CUFF REPAIR Left       Allergies: Ace inhibitors; Codeine; and Penicillins  Medications: Prior to Admission medications   Medication Sig Start Date End Date Taking? Authorizing Provider  acetaminophen (TYLENOL) 500 MG tablet Take 1,000 mg by mouth every 6 (six) hours as needed (for pain/fever/headaches.).    Yes Historical Provider, MD  atorvastatin (LIPITOR) 40 MG tablet TAKE 1 TABLET BY MOUTH DAILY  11/24/15  Yes [name], MD  cephALEXin (KEFLEX) 500 MG capsule Take 1 capsule (500 mg total) by mouth 4 (four) times daily. 04/15/16  Yes [name], MD  clonazePAM (KLONOPIN) 1 MG tablet TAKE 1 TABLET EVERY DAY AT BEDTIME AS NEEDED 06/23/15  Yes [name], MD  docusate sodium (COLACE) 50 MG capsule Take 1 capsule (50 mg total) by mouth 2 (two) times daily as needed (for constipation). 04/22/16  Yes [name], MD    fluconazole (DIFLUCAN) 100 MG tablet Take 1 tablet (100 mg total) by mouth daily. 04/29/16  Yes Volanda Napoleon, MD  furosemide (LASIX) 40 MG tablet TAKE 1 TABLET BY MOUTH EVERY DAY 04/23/16  Yes Eliezer Bottom, NP  gabapentin (NEURONTIN) 300 MG capsule TAKE 2 CAPSULES BY MOUTH 3 TIMES A DAY 04/03/16  Yes [name], MD  Insulin Glargine (TOUJEO SOLOSTAR) 300 UNIT/ML SOPN Inject 20 Units into the skin daily after breakfast. 04/04/16  Yes Lucretia Kern, DO  insulin lispro (HUMALOG) 100 UNIT/ML injection Inject 4 Units into the skin daily with breakfast.    Yes Historical Provider, MD  metFORMIN (GLUCOPHAGE) 500 MG tablet TAKE 1 TABLET BY MOUTH TWICE A DAY WITH A MEAL 03/13/16  Yes [name], MD  metoprolol succinate (TOPROL-XL) 100 MG 24 hr tablet TAKE 1 TABLET EVERY DAY IMMEDIATELY FOLLOWING A MEAL 04/03/16  Yes [name], MD  Multiple Vitamins-Minerals (CENTRUM SILVER 50+MEN) TABS Take 1 tablet by mouth daily.   Yes Historical Provider, MD  pantoprazole (PROTONIX) 40 MG tablet TAKE 1 TABLET BY MOUTH EVERY DAY 12/11/15  Yes [name], MD  pramipexole (MIRAPEX) 1.5 MG tablet MAY TAKE 1 TO 1 AND 1/2 TABLETS BY MOUTH AT BEDTIME 04/09/16  Yes [name], MD  tamsulosin (FLOMAX) 0.4 MG CAPS capsule Take 0.4 mg by mouth at bedtime.   Yes Historical Provider, MD  Trospium Chloride 60 MG CP24 Take 60 mg by mouth daily.   Yes Historical Provider, MD  vitamin B-12 (CYANOCOBALAMIN) 1000 MCG tablet Take 1,000 mcg by mouth daily.   Yes Historical Provider, MD  ACCU-CHEK AVIVA PLUS test strip TEST 3 TIMES A DAY Patient taking differently: TEST 2 TIMES A DAY 10/27/15   [name], MD  clopidogrel (PLAVIX) 75 MG tablet TAKE 1 TABLET BY MOUTH DAILY 11/10/15   Burnell Blanks, MD  nitroGLYCERIN (NITROSTAT) 0.4 MG SL tablet Place 1 tablet (0.4 mg total) under the tongue every 5 (five) minutes as needed. Chest pain 01/20/14   Burnell Blanks, MD  oxybutynin (DITROPAN  XL) 15 MG 24 hr tablet Take 15 mg by mouth every morning. 02/14/16   Historical Provider, MD  pramipexole (MIRAPEX) 1.5 MG tablet Take 1.5-2.25 mg by mouth at bedtime.    Historical Provider, MD  warfarin (COUMADIN) 5 MG tablet Take as directed by anticoagulation clinic 04/26/16   [name], MD     Vital Signs:Blood pressure 139/72, heart rate 75, respirations 16, O2 sat 98% room air, temperature 98.2    Physical Exam awake, alert. Chest clear to auscultation bilaterally. Clean, intact right chest wall Port-A-Cath ;Heart with regular rate and rhythm. Abdomen soft, obese, positive bowel sounds, nontender. Lower extremities with 1-2+ edema bilaterally.  Imaging: No results found.  Labs:  CBC:  Recent Labs  02/29/16 0931 04/02/16 1337 04/29/16 1110 04/30/16 0740  WBC 5.7 6.0 6.7 5.6  HGB 11.3* 11.7* 11.3* 11.0*  HCT 33.8* 35.3* 33.7* 31.6*  PLT 184 162 145 139*    COAGS:  Recent Labs  10/05/15 0755  02/29/16 0913 04/15/16 04/29/16 1110 04/30/16 0740  INR 0.98  < > 2.0 1.5 1.2* 1.16  APTT 32  --   --   --   --  33  < > = values in this interval not displayed.  BMP:  Recent Labs  10/05/15 0755 10/16/15 1444  01/21/16 1733 02/29/16 0931 04/02/16 1337 04/29/16 1110  NA 134* 133*  < > 138 138 136 137  K 3.9 4.6  < > 3.9 3.5 4.2 3.7  CL 102 101  < > 106 100 102 97*  CO2 25 25  < > 24 28 25 30   GLUCOSE 151* 151*  < > 101* 146* 133* 119*  BUN 21* 15  < > 15 12 17 19   CALCIUM 9.3 9.4  < > 9.1 9.5 9.5 9.4  CREATININE 1.33* 1.06  < > 0.84 1.0 0.87 1.1  GFRNONAA 49* 66  --  >60  --  >60  --   GFRAA 57* 76  --  >60  --  >60  --   < > = values in this interval not displayed.  LIVER FUNCTION TESTS:  Recent Labs  01/18/16 0743 01/21/16 1733 02/29/16 0931 04/29/16 1110  BILITOT 0.70 0.6 0.50 0.60  AST 31 27 22 23   ALT 19 12* 14 13  ALKPHOS 111* 86 85* 102*  PROT 6.6 6.9 6.1* 6.4  ALBUMIN 3.4 3.8 3.3 3.4    Assessment and Plan:  Pt with history of  non-Hodgkin's lymphoma ;status post chemotherapy. Recent PET scan reveals significant interval enlargement/ hypermetabolic activity of the left adrenal gland lesion since prior PET from 2017. He presents today for CT-guided left adrenal nodule biopsy for further evaluation.Risks and benefits discussed with the patient/family including, but not limited to bleeding, infection, damage to adjacent structures or low yield requiring additional tests.All of the patient's questions were answered, patient is agreeable to proceed.Consent signed and in chart.     Electronically Signed: D. Rowe Robert 04/30/2016, 8:30 AM   I spent a total of 20 minutes at the the patient's bedside AND on the patient's hospital floor or unit, greater than 50% of which was counseling/coordinating care for CT-guided left adrenal nodule biopsy

## 2016-04-30 NOTE — Procedures (Signed)
Pre procedural Dx: Indeterminate left adrenal gland nodule / mass Post procedural Dx: Same  Technically successful CT guided biopsy of indeterminate left adrenal gland nodule / mass.   EBL: None.   Complications: None immediate.   Ronny Bacon, MD Pager #: [phone]

## 2016-04-30 NOTE — Discharge Instructions (Signed)
Needle Biopsy, Care After °These instructions give you information about caring for yourself after your procedure. Your doctor may also give you more specific instructions. Call your doctor if you have any problems or questions after your procedure. °Follow these instructions at home: °· Rest as told by your doctor. °· Take medicines only as told by your doctor. °· There are many different ways to close and cover the biopsy site, including stitches (sutures), skin glue, and adhesive strips. Follow instructions from your doctor about: °¨ How to take care of your biopsy site. °¨ When and how you should change your bandage (dressing). °¨ When you should remove your dressing. °¨ Removing whatever was used to close your biopsy site. °· Check your biopsy site every day for signs of infection. Watch for: °¨ Redness, swelling, or pain. °¨ Fluid, blood, or pus. °Contact a doctor if: °· You have a fever. °· You have redness, swelling, or pain at the biopsy site, and it lasts longer than a few days. °· You have fluid, blood, or pus coming from the biopsy site. °· You feel sick to your stomach (nauseous). °· You throw up (vomit). °Get help right away if: °· You are short of breath. °· You have trouble breathing. °· Your chest hurts. °· You feel dizzy or you pass out (faint). °· You have bleeding that does not stop with pressure or a bandage. °· You cough up blood. °· Your belly (abdomen) hurts. °This information is not intended to replace advice given to you by your health care provider. Make sure you discuss any questions you have with your health care provider. °Document Released: 12/07/2007 Document Revised: 06/01/2015 Document Reviewed: 12/20/2013 °Elsevier Interactive Patient Education © 2017 [employer] ° ° °Moderate Conscious Sedation, Adult, Care After °These instructions provide you with information about caring for yourself after your procedure. Your health care provider may also give you more specific instructions.  Your treatment has been planned according to current medical practices, but problems sometimes occur. Call your health care provider if you have any problems or questions after your procedure. °What can I expect after the procedure? °After your procedure, it is common: °· To feel sleepy for several hours. °· To feel clumsy and have poor balance for several hours. °· To have poor judgment for several hours. °· To vomit if you eat too soon. °Follow these instructions at home: °For at least 24 hours after the procedure:  ° °· Do not: °¨ Participate in activities where you could fall or become injured. °¨ Drive. °¨ Use heavy machinery. °¨ Drink alcohol. °¨ Take sleeping pills or medicines that cause drowsiness. °¨ Make important decisions or sign legal documents. °¨ Take care of children on your own. °· Rest. °Eating and drinking  °· Follow the diet recommended by your health care provider. °· If you vomit: °¨ Drink water, juice, or soup when you can drink without vomiting. °¨ Make sure you have little or no nausea before eating solid foods. °General instructions  °· Have a responsible adult stay with you until you are awake and alert. °· Take over-the-counter and prescription medicines only as told by your health care provider. °· If you smoke, do not smoke without supervision. °· Keep all follow-up visits as told by your health care provider. This is important. °Contact a health care provider if: °· You keep feeling nauseous or you keep vomiting. °· You feel light-headed. °· You develop a rash. °· You have a fever. °Get help right   away if: °· You have trouble breathing. °This information is not intended to replace advice given to you by your health care provider. Make sure you discuss any questions you have with your health care provider. °Document Released: 10/14/2012 Document Revised: 05/29/2015 Document Reviewed: 04/15/2015 °Elsevier Interactive Patient Education © 2017 [employer] ° °

## 2016-05-01 ENCOUNTER — Ambulatory Visit: Payer: Medicare Other | Admitting: Family Medicine

## 2016-05-02 ENCOUNTER — Ambulatory Visit (HOSPITAL_BASED_OUTPATIENT_CLINIC_OR_DEPARTMENT_OTHER): Payer: Medicare Other | Admitting: Hematology & Oncology

## 2016-05-02 ENCOUNTER — Ambulatory Visit: Payer: Medicare Other | Admitting: Radiation Oncology

## 2016-05-02 ENCOUNTER — Telehealth: Payer: Self-pay

## 2016-05-02 ENCOUNTER — Other Ambulatory Visit: Payer: Self-pay | Admitting: Family Medicine

## 2016-05-02 VITALS — BP 123/60 | HR 77 | Temp 98.4°F | Resp 16 | Wt 239.0 lb

## 2016-05-02 DIAGNOSIS — C8339 Diffuse large B-cell lymphoma, extranodal and solid organ sites: Secondary | ICD-10-CM

## 2016-05-02 DIAGNOSIS — C8589 Other specified types of non-Hodgkin lymphoma, extranodal and solid organ sites: Secondary | ICD-10-CM

## 2016-05-02 NOTE — Telephone Encounter (Signed)
Pt called to state that he has been off of Coumadin/Plavix since last Friday in anticipation of a surgical procedure. He has now been found to have an additional new cancer mass on his adrenal gland. Dr. Marin Olp has mentioned possible surgical removal. Pt needs to know how long he can be off of his Coumadin or if he needs to restart. Pt is hesitant to restart in the event that Dr. Marin Olp wants to do surgery.   Pt spoke with Villa Herb, RN and was advised to restart Coumadin. See TE.

## 2016-05-02 NOTE — Progress Notes (Signed)
Hematology and Oncology Follow Up Visit  Gary Yates [id number] November 28, 1933 83 y.o. 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 Unfortunately, it looks like we do have recurrent disease. He did have a biopsy. This was done on April 24. The pathology report ([id number]) shows diffuse large cell lymphoma. It is CD20 positive.  He looks good. Considering all that he has going on with him, he has a fairly decent performance status.  His a promises back. This is what has bothered him mostly.  He has had no problems with bowels or bladder.  He is on Coumadin and Plavix.  He's had no nausea or vomiting. He's had no rashes. He has had no fever.   Overall, I would say that his performance status is ECOG 1-2.  Medications:  Current Outpatient Prescriptions:  .  ACCU-CHEK AVIVA PLUS test strip, TEST 3 TIMES A DAY (Patient taking differently: TEST 2 TIMES A DAY), Disp: 300 each, Rfl: 5 .  acetaminophen (TYLENOL) 500 MG tablet, Take 1,000 mg by mouth every 6 (six) hours as needed (for pain/fever/headaches.). , Disp: , Rfl:  .  atorvastatin (LIPITOR) 40 MG tablet, TAKE 1 TABLET BY MOUTH DAILY, Disp: 90 tablet, Rfl: 2 .  cephALEXin (KEFLEX) 500 MG capsule, Take 1 capsule (500 mg total) by mouth 4 (four) times daily., Disp: 28 capsule, Rfl: 0 .  clonazePAM (KLONOPIN) 1 MG tablet, TAKE 1 TABLET EVERY DAY AT BEDTIME AS NEEDED, Disp: 30 tablet, Rfl: 5 .  clopidogrel (PLAVIX) 75 MG tablet, TAKE 1 TABLET BY MOUTH DAILY, Disp: 30 tablet, Rfl: 8 .  docusate sodium (COLACE) 50 MG capsule, Take 1 capsule (50 mg total) by mouth 2 (two) times daily as needed (for constipation)., Disp: 90 capsule, Rfl: 0 .  fluconazole (DIFLUCAN) 100 MG tablet,  Take 1 tablet (100 mg total) by mouth daily., Disp: 7 tablet, Rfl: 0 .  furosemide (LASIX) 40 MG tablet, TAKE 1 TABLET BY MOUTH EVERY DAY, Disp: 30 tablet, Rfl: 1 .  gabapentin (NEURONTIN) 300 MG capsule, TAKE 2 CAPSULES BY MOUTH 3 TIMES A DAY, Disp: 180 capsule, Rfl: 2 .  Insulin Glargine (TOUJEO SOLOSTAR) 300 UNIT/ML SOPN, Inject 20 Units into the skin daily after breakfast., Disp: 1 pen, Rfl: 0 .  insulin lispro (HUMALOG) 100 UNIT/ML injection, Inject 4 Units into the skin daily with breakfast. , Disp: , Rfl:  .  metFORMIN (GLUCOPHAGE) 500 MG tablet, TAKE 1 TABLET BY MOUTH TWICE A DAY WITH A MEAL, Disp: 60 tablet, Rfl: 5 .  metoprolol succinate (TOPROL-XL) 100 MG 24 hr tablet, TAKE 1 TABLET EVERY DAY IMMEDIATELY FOLLOWING A MEAL, Disp: 90 tablet, Rfl: 1 .  Multiple Vitamins-Minerals (CENTRUM SILVER 50+MEN) TABS, Take 1 tablet by mouth daily., Disp: , Rfl:  .  nitroGLYCERIN (NITROSTAT) 0.4 MG SL tablet, Place 1 tablet (0.4 mg total) under the tongue every 5 (five) minutes as needed. Chest pain, Disp: 25 tablet, Rfl: 6 .  oxybutynin (DITROPAN XL) 15 MG 24 hr tablet, Take 15 mg by mouth every morning., Disp: , Rfl:  .  pantoprazole (PROTONIX) 40 MG tablet, TAKE 1 TABLET BY MOUTH EVERY DAY, Disp: 90 tablet, Rfl: 3 .  pramipexole (MIRAPEX) 1.5 MG tablet, MAY TAKE 1 TO  1 AND 1/2 TABLETS BY MOUTH AT BEDTIME, Disp: 135 tablet, Rfl: 2 .  tamsulosin (FLOMAX) 0.4 MG CAPS capsule, Take 0.4 mg by mouth at bedtime., Disp: , Rfl:  .  Trospium Chloride 60 MG CP24, Take 60 mg by mouth daily., Disp: , Rfl:  .  vitamin B-12 (CYANOCOBALAMIN) 1000 MCG tablet, Take 1,000 mcg by mouth daily., Disp: , Rfl:  .  warfarin (COUMADIN) 5 MG tablet, Take as directed by anticoagulation clinic, Disp: 60 tablet, Rfl: 1 No current facility-administered medications for this visit.   Facility-Administered Medications Ordered in Other Visits:  .  testosterone cypionate (DEPOTESTOTERONE CYPIONATE) injection 200 mg, 200 mg,  Intramuscular, Q28 days, [name], MD, 200 mg at 12/26/11 0850 .  testosterone cypionate (DEPOTESTOTERONE CYPIONATE) injection 200 mg, 200 mg, Intramuscular, Q28 days, [name], MD, 200 mg at 01/23/12 0908 .  testosterone cypionate (DEPOTESTOTERONE CYPIONATE) injection 200 mg, 200 mg, Intramuscular, Q28 days, [name], MD, 200 mg at 02/25/12 0848 .  testosterone cypionate (DEPOTESTOTERONE CYPIONATE) injection 200 mg, 200 mg, Intramuscular, Q28 days, [name], MD, 200 mg at 06/03/12 1437  Allergies:  Allergies  Allergen Reactions  . Ace Inhibitors Other (See Comments)    cough  . Codeine Nausea Only and Rash       . Penicillins Rash    Childhood allergy Has patient had a PCN reaction causing immediate rash, facial/tongue/throat swelling, SOB or lightheadedness with hypotension: Yes Has patient had a PCN reaction causing severe rash involving mucus membranes or skin necrosis: Yes Has patient had a PCN reaction that required hospitalization No Has patient had a PCN reaction occurring within the last 10 years: No If all of the above answers are "NO", then may proceed with Cephalosporin use.     Past Medical History, Surgical history, Social history, and Family History were reviewed and updated.  Review of Systems:  As above  Physical Exam:  weight is 239 lb (108.4 kg). His oral temperature is 98.4 F (36.9 C). His blood pressure is 123/60 and his pulse is 77. His respiration is 16 and oxygen saturation is 97%.   Wt Readings from Last 3 Encounters:  05/02/16 239 lb (108.4 kg)  04/30/16 237 lb 6 oz (107.7 kg)  04/29/16 239 lb (108.4 kg)      Elderly white male. He started to lose his hair. Head and neck exam shows no ocular or oral lesions. There are no palpable cervical or supraclavicular lymph nodes. He has no oral thrush. There is no oral mucositis. There is no palpable adenopathy in the neck. Lungs are clear to percussion and auscultation  bilaterally. Cardiac exam regular rate and rhythm consistent with atrial fibrillation. He has no murmurs, rubs or bruits. Abdomen is soft. He has decent bowel sounds. There is no fluid wave. There is no guarding or rebound tenderness. He has no palpable hepatosplenomegaly. He has a healed right inguinal orchiectomy scar. Extremities shows chronic 1+ edema in his legs. He has some stasis dermatitis changes in his legs. Neurological exam shows no focal neurological deficits. Skin exam shows some brawny edema in his lower legs.  Lab Results  Component Value Date   WBC 5.6 04/30/2016   HGB 11.0 (L) 04/30/2016   HCT 31.6 (L) 04/30/2016   MCV 85.4 04/30/2016   PLT 139 (L) 04/30/2016     Chemistry      Component Value Date/Time   NA 137 04/29/2016 1110   NA 137 12/14/2015 1100   K  3.7 04/29/2016 1110   K 3.7 12/14/2015 1100   CL 97 (L) 04/29/2016 1110   CO2 30 04/29/2016 1110   CO2 24 12/14/2015 1100   BUN 19 04/29/2016 1110   BUN 18.4 12/14/2015 1100   CREATININE 1.1 04/29/2016 1110   CREATININE 0.9 12/14/2015 1100      Component Value Date/Time   CALCIUM 9.4 04/29/2016 1110   CALCIUM 9.5 12/14/2015 1100   ALKPHOS 102 (H) 04/29/2016 1110   ALKPHOS 102 12/14/2015 1100   AST 23 04/29/2016 1110   AST 22 12/14/2015 1100   ALT 13 04/29/2016 1110   ALT 10 12/14/2015 1100   BILITOT 0.60 04/29/2016 1110   BILITOT 0.58 12/14/2015 1100         Impression and Plan: Mr. Lutze is an 83 year old white male. He has testicular lymphoma. He is on systemic chemotherapy. He has a marginal performance status. As such, we can really cannot be all that aggressive with him.    At this point, I think we have to reinstitute systemic chemotherapy. I think that he would be able to tolerate a combination of Rituxan/bendamustine/Velcade. I think this would be a a good 3 drug combination for him.  I talked to he and his family this afternoon. I explained to them the side effects. Given his age, his comorbid  conditions, I think that this is a very reasonable combination that he would be able to tolerate and I think would be effective.  I don't think that his blood counts be all that affected. I don't think that he will lose his hair. It should not really cause issues with his anticoagulation.  I would give him 2 cycles of treatment and then repeat his PET scan and see how that looks.  I probably spent close to 45 mins with he and his family. I just feel bad that he relapsed so quickly. He was supposed to have back surgery but he developed cellulitis in his feet and this had to be postponed. I told him that I think it would be at least 4 months before we will consider him for surgery.  We will probably see him back weekly for right now.  Volanda Napoleon, MD 4/26/20185:02 PM

## 2016-05-02 NOTE — Telephone Encounter (Signed)
Denied.  Message sent to the pharmacy to have the pt contact provider if he thinks further refills are needed.

## 2016-05-03 ENCOUNTER — Other Ambulatory Visit: Payer: Self-pay | Admitting: Pharmacist

## 2016-05-09 ENCOUNTER — Ambulatory Visit: Payer: Medicare Other

## 2016-05-09 ENCOUNTER — Other Ambulatory Visit: Payer: Medicare Other

## 2016-05-09 ENCOUNTER — Ambulatory Visit (HOSPITAL_BASED_OUTPATIENT_CLINIC_OR_DEPARTMENT_OTHER): Payer: Medicare Other

## 2016-05-09 ENCOUNTER — Encounter: Payer: Self-pay | Admitting: Hematology & Oncology

## 2016-05-09 ENCOUNTER — Ambulatory Visit (HOSPITAL_BASED_OUTPATIENT_CLINIC_OR_DEPARTMENT_OTHER): Payer: Medicare Other | Admitting: Hematology & Oncology

## 2016-05-09 VITALS — BP 133/59 | HR 74 | Temp 97.5°F | Resp 18 | Wt 236.4 lb

## 2016-05-09 VITALS — BP 131/67 | HR 74 | Temp 98.1°F | Resp 16

## 2016-05-09 DIAGNOSIS — C8589 Other specified types of non-Hodgkin lymphoma, extranodal and solid organ sites: Secondary | ICD-10-CM

## 2016-05-09 DIAGNOSIS — C8339 Diffuse large B-cell lymphoma, extranodal and solid organ sites: Secondary | ICD-10-CM | POA: Diagnosis not present

## 2016-05-09 DIAGNOSIS — Z5112 Encounter for antineoplastic immunotherapy: Secondary | ICD-10-CM

## 2016-05-09 DIAGNOSIS — I480 Paroxysmal atrial fibrillation: Secondary | ICD-10-CM | POA: Diagnosis not present

## 2016-05-09 DIAGNOSIS — Z5111 Encounter for antineoplastic chemotherapy: Secondary | ICD-10-CM

## 2016-05-09 LAB — CMP (CANCER CENTER ONLY)
ALBUMIN: 3.6 g/dL (ref 3.3–5.5)
ALT(SGPT): 17 U/L (ref 10–47)
AST: 23 U/L (ref 11–38)
Alkaline Phosphatase: 78 U/L (ref 26–84)
BILIRUBIN TOTAL: 0.7 mg/dL (ref 0.20–1.60)
BUN, Bld: 16 mg/dL (ref 7–22)
CO2: 27 mEq/L (ref 18–33)
CREATININE: 1 mg/dL (ref 0.6–1.2)
Calcium: 9.3 mg/dL (ref 8.0–10.3)
Chloride: 100 mEq/L (ref 98–108)
Glucose, Bld: 121 mg/dL — ABNORMAL HIGH (ref 73–118)
Potassium: 3.7 mEq/L (ref 3.3–4.7)
SODIUM: 140 meq/L (ref 128–145)
TOTAL PROTEIN: 6.6 g/dL (ref 6.4–8.1)

## 2016-05-09 LAB — CBC WITH DIFFERENTIAL (CANCER CENTER ONLY)
BASO#: 0 10*3/uL (ref 0.0–0.2)
BASO%: 0.5 % (ref 0.0–2.0)
EOS%: 4 % (ref 0.0–7.0)
Eosinophils Absolute: 0.2 10*3/uL (ref 0.0–0.5)
HCT: 31.7 % — ABNORMAL LOW (ref 38.7–49.9)
HEMOGLOBIN: 10.6 g/dL — AB (ref 13.0–17.1)
LYMPH#: 1.2 10*3/uL (ref 0.9–3.3)
LYMPH%: 21.3 % (ref 14.0–48.0)
MCH: 29.8 pg (ref 28.0–33.4)
MCHC: 33.4 g/dL (ref 32.0–35.9)
MCV: 89 fL (ref 82–98)
MONO#: 0.6 10*3/uL (ref 0.1–0.9)
MONO%: 11.2 % (ref 0.0–13.0)
NEUT#: 3.5 10*3/uL (ref 1.5–6.5)
NEUT%: 63 % (ref 40.0–80.0)
Platelets: 149 10*3/uL (ref 145–400)
RBC: 3.56 10*6/uL — ABNORMAL LOW (ref 4.20–5.70)
RDW: 14.5 % (ref 11.1–15.7)
WBC: 5.5 10*3/uL (ref 4.0–10.0)

## 2016-05-09 LAB — PROTIME-INR (CHCC SATELLITE)
INR: 2.4 (ref 2.0–3.5)
PROTIME: 28.8 s — AB (ref 10.6–13.4)

## 2016-05-09 MED ORDER — PALONOSETRON HCL INJECTION 0.25 MG/5ML
INTRAVENOUS | Status: AC
  Filled 2016-05-09: qty 5

## 2016-05-09 MED ORDER — ACETAMINOPHEN 325 MG PO TABS
ORAL_TABLET | ORAL | Status: AC
  Filled 2016-05-09: qty 2

## 2016-05-09 MED ORDER — DIPHENHYDRAMINE HCL 25 MG PO CAPS
ORAL_CAPSULE | ORAL | Status: AC
  Filled 2016-05-09: qty 2

## 2016-05-09 MED ORDER — DEXAMETHASONE SODIUM PHOSPHATE 10 MG/ML IJ SOLN
10.0000 mg | Freq: Once | INTRAMUSCULAR | Status: AC
  Administered 2016-05-09: 10 mg via INTRAVENOUS

## 2016-05-09 MED ORDER — SODIUM CHLORIDE 0.9 % IV SOLN
Freq: Once | INTRAVENOUS | Status: AC
  Administered 2016-05-09: 10:00:00 via INTRAVENOUS

## 2016-05-09 MED ORDER — PALONOSETRON HCL INJECTION 0.25 MG/5ML
0.2500 mg | Freq: Once | INTRAVENOUS | Status: AC
  Administered 2016-05-09: 0.25 mg via INTRAVENOUS

## 2016-05-09 MED ORDER — ACETAMINOPHEN 325 MG PO TABS
650.0000 mg | ORAL_TABLET | Freq: Once | ORAL | Status: AC
  Administered 2016-05-09: 650 mg via ORAL

## 2016-05-09 MED ORDER — SODIUM CHLORIDE 0.9% FLUSH
10.0000 mL | INTRAVENOUS | Status: DC | PRN
  Filled 2016-05-09: qty 10

## 2016-05-09 MED ORDER — HEPARIN SOD (PORK) LOCK FLUSH 100 UNIT/ML IV SOLN
500.0000 [IU] | Freq: Once | INTRAVENOUS | Status: DC | PRN
  Filled 2016-05-09: qty 5

## 2016-05-09 MED ORDER — BORTEZOMIB CHEMO SQ INJECTION 3.5 MG (2.5MG/ML)
1.3000 mg/m2 | INTRAMUSCULAR | Status: DC
  Administered 2016-05-09: 3 mg via SUBCUTANEOUS
  Filled 2016-05-09: qty 3

## 2016-05-09 MED ORDER — SODIUM CHLORIDE 0.9 % IV SOLN
375.0000 mg/m2 | Freq: Once | INTRAVENOUS | Status: AC
Start: 2016-05-09 — End: 2016-05-09
  Administered 2016-05-09: 900 mg via INTRAVENOUS
  Filled 2016-05-09: qty 50

## 2016-05-09 MED ORDER — DEXAMETHASONE SODIUM PHOSPHATE 10 MG/ML IJ SOLN
INTRAMUSCULAR | Status: AC
  Filled 2016-05-09: qty 1

## 2016-05-09 MED ORDER — SODIUM CHLORIDE 0.9 % IV SOLN
81.0000 mg/m2 | Freq: Once | INTRAVENOUS | Status: AC
  Administered 2016-05-09: 175 mg via INTRAVENOUS
  Filled 2016-05-09: qty 7

## 2016-05-09 MED ORDER — DIPHENHYDRAMINE HCL 25 MG PO CAPS
50.0000 mg | ORAL_CAPSULE | Freq: Once | ORAL | Status: AC
  Administered 2016-05-09: 50 mg via ORAL

## 2016-05-09 NOTE — Progress Notes (Signed)
Hematology and Oncology Follow Up Visit  Gary Yates [id number] [birth year] 83 y.o. 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 Unfortunately, it looks like we do have recurrent disease. He did have a biopsy. This was done on April 24. The pathology report ([id number]) shows diffuse large cell lymphoma. It is CD20 positive.  He looks good. Considering all that he has going on with him, he has a fairly decent performance status.  His a promises back. This is what has bothered him mostly.  He has had no problems with bowels or bladder.  He is on Coumadin and Plavix.  He's had no nausea or vomiting. He's had no rashes. He has had no fever.   Overall, I would say that his performance status is ECOG 1-2.  Medications:  Current Outpatient Prescriptions:  .  ACCU-CHEK AVIVA PLUS test strip, TEST 3 TIMES A DAY (Patient taking differently: TEST 2 TIMES A DAY), Disp: 300 each, Rfl: 5 .  acetaminophen (TYLENOL) 500 MG tablet, Take 1,000 mg by mouth every 6 (six) hours as needed (for pain/fever/headaches.). , Disp: , Rfl:  .  atorvastatin (LIPITOR) 40 MG tablet, TAKE 1 TABLET BY MOUTH DAILY, Disp: 90 tablet, Rfl: 2 .  clonazePAM (KLONOPIN) 1 MG tablet, TAKE 1 TABLET EVERY DAY AT BEDTIME AS NEEDED, Disp: 30 tablet, Rfl: 5 .  clopidogrel (PLAVIX) 75 MG tablet, TAKE 1 TABLET BY MOUTH DAILY, Disp: 30 tablet, Rfl: 8 .  docusate sodium (COLACE) 50 MG capsule, Take 1 capsule (50 mg total) by mouth 2 (two) times daily as needed (for constipation)., Disp: 90 capsule, Rfl: 0 .  fluconazole (DIFLUCAN) 100 MG tablet, Take 1 tablet (100 mg total) by mouth daily., Disp: 7 tablet, Rfl: 0 .  furosemide (LASIX) 40 MG tablet, TAKE 1 TABLET BY  MOUTH EVERY DAY, Disp: 30 tablet, Rfl: 1 .  gabapentin (NEURONTIN) 300 MG capsule, TAKE 2 CAPSULES BY MOUTH 3 TIMES A DAY, Disp: 180 capsule, Rfl: 2 .  Insulin Glargine (TOUJEO SOLOSTAR) 300 UNIT/ML SOPN, Inject 20 Units into the skin daily after breakfast., Disp: 1 pen, Rfl: 0 .  insulin lispro (HUMALOG) 100 UNIT/ML injection, Inject 4 Units into the skin daily with breakfast. , Disp: , Rfl:  .  metFORMIN (GLUCOPHAGE) 500 MG tablet, TAKE 1 TABLET BY MOUTH TWICE A DAY WITH A MEAL, Disp: 60 tablet, Rfl: 5 .  metoprolol succinate (TOPROL-XL) 100 MG 24 hr tablet, TAKE 1 TABLET EVERY DAY IMMEDIATELY FOLLOWING A MEAL, Disp: 90 tablet, Rfl: 1 .  Multiple Vitamins-Minerals (CENTRUM SILVER 50+MEN) TABS, Take 1 tablet by mouth daily., Disp: , Rfl:  .  nitroGLYCERIN (NITROSTAT) 0.4 MG SL tablet, Place 1 tablet (0.4 mg total) under the tongue every 5 (five) minutes as needed. Chest pain, Disp: 25 tablet, Rfl: 6 .  oxybutynin (DITROPAN XL) 15 MG 24 hr tablet, Take 15 mg by mouth every morning., Disp: , Rfl:  .  pantoprazole (PROTONIX) 40 MG tablet, TAKE 1 TABLET BY MOUTH EVERY DAY, Disp: 90 tablet, Rfl: 3 .  pramipexole (MIRAPEX) 1.5 MG tablet, MAY TAKE 1 TO 1 AND 1/2 TABLETS BY MOUTH AT BEDTIME, Disp: 135 tablet, Rfl: 2 .  tamsulosin (FLOMAX) 0.4 MG CAPS capsule, Take 0.4 mg  by mouth at bedtime., Disp: , Rfl:  .  Trospium Chloride 60 MG CP24, Take 60 mg by mouth daily., Disp: , Rfl:  .  vitamin B-12 (CYANOCOBALAMIN) 1000 MCG tablet, Take 1,000 mcg by mouth daily., Disp: , Rfl:  .  warfarin (COUMADIN) 5 MG tablet, Take as directed by anticoagulation clinic, Disp: 60 tablet, Rfl: 1 No current facility-administered medications for this visit.   Facility-Administered Medications Ordered in Other Visits:  .  bortezomib SQ (VELCADE) chemo injection 3 mg, 1.3 mg/m2 (Treatment Plan Recorded), Subcutaneous, Weekly, Gary Napoleon, Gary Yates, 3 mg at 05/09/16 1459 .  heparin lock flush 100 unit/mL, 500 Units, Intracatheter,  Once PRN, Gary Napoleon, Gary Yates .  sodium chloride flush (NS) 0.9 % injection 10 mL, 10 mL, Intracatheter, PRN, Gary Napoleon, Gary Yates .  testosterone cypionate (DEPOTESTOTERONE CYPIONATE) injection 200 mg, 200 mg, Intramuscular, Q28 days, Gary Post, Gary Yates, 200 mg at 12/26/11 0850 .  testosterone cypionate (DEPOTESTOTERONE CYPIONATE) injection 200 mg, 200 mg, Intramuscular, Q28 days, Gary Post, Gary Yates, 200 mg at 01/23/12 0908 .  testosterone cypionate (DEPOTESTOTERONE CYPIONATE) injection 200 mg, 200 mg, Intramuscular, Q28 days, Gary Post, Gary Yates, 200 mg at 02/25/12 0848 .  testosterone cypionate (DEPOTESTOTERONE CYPIONATE) injection 200 mg, 200 mg, Intramuscular, Q28 days, Gary Post, Gary Yates, 200 mg at 06/03/12 1437  Allergies:  Allergies  Allergen Reactions  . Ace Inhibitors Other (See Comments)    cough  . Codeine Nausea Only and Rash       . Penicillins Rash    Childhood allergy Has patient had a PCN reaction causing immediate rash, facial/tongue/throat swelling, SOB or lightheadedness with hypotension: Yes Has patient had a PCN reaction causing severe rash involving mucus membranes or skin necrosis: Yes Has patient had a PCN reaction that required hospitalization No Has patient had a PCN reaction occurring within the last 10 years: No If all of the above answers are "NO", then may proceed with Cephalosporin use.     Past Medical History, Surgical history, Social history, and Family History were reviewed and updated.  Review of Systems:  As above  Physical Exam:  weight is 236 lb 6.4 oz (107.2 kg). His oral temperature is 97.5 F (36.4 C). His blood pressure is 133/59 (abnormal) and his pulse is 74. His respiration is 18 and oxygen saturation is 96%.   Wt Readings from Last 3 Encounters:  05/09/16 236 lb 6.4 oz (107.2 kg)  05/02/16 239 lb (108.4 kg)  04/30/16 237 lb 6 oz (107.7 kg)      Elderly white male. He started to lose his hair. Head and neck exam shows  no ocular or oral lesions. There are no palpable cervical or supraclavicular lymph nodes. He has no oral thrush. There is no oral mucositis. There is no palpable adenopathy in the neck. Lungs are clear to percussion and auscultation bilaterally. Cardiac exam regular rate and rhythm consistent with atrial fibrillation. He has no murmurs, rubs or bruits. Abdomen is soft. He has decent bowel sounds. There is no fluid wave. There is no guarding or rebound tenderness. He has no palpable hepatosplenomegaly. He has a healed right inguinal orchiectomy scar. Extremities shows chronic 1+ edema in his legs. He has some stasis dermatitis changes in his legs. Neurological exam shows no focal neurological deficits. Skin exam shows some brawny edema in his lower legs.  Lab Results  Component Value Date   WBC 5.5 05/09/2016   HGB 10.6 (L) 05/09/2016   HCT 31.7 (  L) 05/09/2016   MCV 89 05/09/2016   PLT 149 05/09/2016     Chemistry      Component Value Date/Time   NA 140 05/09/2016 0819   NA 137 12/14/2015 1100   K 3.7 05/09/2016 0819   K 3.7 12/14/2015 1100   CL 100 05/09/2016 0819   CO2 27 05/09/2016 0819   CO2 24 12/14/2015 1100   BUN 16 05/09/2016 0819   BUN 18.4 12/14/2015 1100   CREATININE 1.0 05/09/2016 0819   CREATININE 0.9 12/14/2015 1100      Component Value Date/Time   CALCIUM 9.3 05/09/2016 0819   CALCIUM 9.5 12/14/2015 1100   ALKPHOS 78 05/09/2016 0819   ALKPHOS 102 12/14/2015 1100   AST 23 05/09/2016 0819   AST 22 12/14/2015 1100   ALT 17 05/09/2016 0819   ALT 10 12/14/2015 1100   BILITOT 0.70 05/09/2016 0819   BILITOT 0.58 12/14/2015 1100         Impression and Plan: Mr. Mantell is an 83 year old white male. He has testicular lymphoma. He is on systemic chemotherapy. He has a marginal performance status. As such, we can really cannot be all that aggressive with him.    At this point, I think we have to reinstitute systemic chemotherapy. I think that he would be able to tolerate a  combination of Rituxan/bendamustine/Velcade. I think this would be a a good 3 drug combination for him.  I talked to he and his family this afternoon. I explained to them the side effects. Given his age, his comorbid conditions, I think that this is a very reasonable combination that he would be able to tolerate and I think would be effective.  I don't think that his blood counts be all that affected. I don't think that he will lose his hair. It should not really cause issues with his anticoagulation.  I would give him 2 cycles of treatment and then repeat his PET scan and see how that looks.  I probably spent close to 45 mins with he and his family. I just feel bad that he relapsed so quickly. He was supposed to have back surgery but he developed cellulitis in his feet and this had to be postponed. I told him that I think it would be at least 4 months before we will consider him for surgery.  We will probably see him back weekly for right now.  Gary Napoleon, Gary Yates 5/3/20185:56 PM

## 2016-05-09 NOTE — Patient Instructions (Signed)
Bortezomib injection What is this medicine? BORTEZOMIB (bor TEZ oh mib) is a medicine that targets proteins in cancer cells and stops the cancer cells from growing. It is used to treat multiple myeloma and mantle-cell lymphoma. This medicine may be used for other purposes; ask your health care provider or pharmacist if you have questions. COMMON BRAND NAME(S): Velcade What should I tell my health care provider before I take this medicine? They need to know if you have any of these conditions: -diabetes -heart disease -irregular heartbeat -liver disease -on hemodialysis -low blood counts, like low white blood cells, platelets, or hemoglobin -peripheral neuropathy -taking medicine for blood pressure -an unusual or allergic reaction to bortezomib, mannitol, boron, other medicines, foods, dyes, or preservatives -pregnant or trying to get pregnant -breast-feeding How should I use this medicine? This medicine is for injection into a vein or for injection under the skin. It is given by a health care professional in a hospital or clinic setting. Talk to your pediatrician regarding the use of this medicine in children. Special care may be needed. Overdosage: If you think you have taken too much of this medicine contact a poison control center or emergency room at once. NOTE: This medicine is only for you. Do not share this medicine with others. What if I miss a dose? It is important not to miss your dose. Call your doctor or health care professional if you are unable to keep an appointment. What may interact with this medicine? This medicine may interact with the following medications: -ketoconazole -rifampin -ritonavir -St. John's Wort This list may not describe all possible interactions. Give your health care provider a list of all the medicines, herbs, non-prescription drugs, or dietary supplements you use. Also tell them if you smoke, drink alcohol, or use illegal drugs. Some items may  interact with your medicine. What should I watch for while using this medicine? You may get drowsy or dizzy. Do not drive, use machinery, or do anything that needs mental alertness until you know how this medicine affects you. Do not stand or sit up quickly, especially if you are an older patient. This reduces the risk of dizzy or fainting spells. In some cases, you may be given additional medicines to help with side effects. Follow all directions for their use. Call your doctor or health care professional for advice if you get a fever, chills or sore throat, or other symptoms of a cold or flu. Do not treat yourself. This drug decreases your body's ability to fight infections. Try to avoid being around people who are sick. This medicine may increase your risk to bruise or bleed. Call your doctor or health care professional if you notice any unusual bleeding. You may need blood work done while you are taking this medicine. In some patients, this medicine may cause a serious brain infection that may cause death. If you have any problems seeing, thinking, speaking, walking, or standing, tell your doctor right away. If you cannot reach your doctor, urgently seek other source of medical care. Check with your doctor or health care professional if you get an attack of severe diarrhea, nausea and vomiting, or if you sweat a lot. The loss of too much body fluid can make it dangerous for you to take this medicine. Do not become pregnant while taking this medicine or for at least 2 months after stopping it. Women should inform their doctor if they wish to become pregnant or think they might be pregnant. Men should not  father a child while taking this medicine and for at least 2 months after stopping it. There is a potential for serious side effects to an unborn child. Talk to your health care professional or pharmacist for more information. Do not breast-feed an infant while taking this medicine or for 2 months after  stopping it. This medicine may interfere with the ability to have a child. You should talk with your doctor or health care professional if you are concerned about your fertility. What side effects may I notice from receiving this medicine? Side effects that you should report to your doctor or health care professional as soon as possible: -allergic reactions like skin rash, itching or hives, swelling of the face, lips, or tongue -breathing problems -changes in hearing -changes in vision -fast, irregular heartbeat -feeling faint or lightheaded, falls -pain, tingling, numbness in the hands or feet -right upper belly pain -seizures -swelling of the ankles, feet, hands -unusual bleeding or bruising -unusually weak or tired -vomiting -yellowing of the eyes or skin Side effects that usually do not require medical attention (report to your doctor or health care professional if they continue or are bothersome): -changes in emotions or moods -constipation -diarrhea -loss of appetite -headache -irritation at site where injected -nausea This list may not describe all possible side effects. Call your doctor for medical advice about side effects. You may report side effects to FDA at 1-800-FDA-1088. Where should I keep my medicine? This drug is given in a hospital or clinic and will not be stored at home. NOTE: This sheet is a summary. It may not cover all possible information. If you have questions about this medicine, talk to your doctor, pharmacist, or health care provider.  2018 Elsevier/Gold Standard (2015-11-23 15:53:51) Bendamustine Injection What is this medicine? BENDAMUSTINE (BEN da MUS teen) is a chemotherapy drug. It is used to treat chronic lymphocytic leukemia and non-Hodgkin lymphoma. This medicine may be used for other purposes; ask your health care provider or pharmacist if you have questions. COMMON BRAND NAME(S): BENDEKA, Treanda What should I tell my health care provider  before I take this medicine? They need to know if you have any of these conditions: -infection (especially a virus infection such as chickenpox, cold sores, or herpes) -kidney disease -liver disease -an unusual or allergic reaction to bendamustine, mannitol, other medicines, foods, dyes, or preservatives -pregnant or trying to get pregnant -breast-feeding How should I use this medicine? This medicine is for infusion into a vein. It is given by a health care professional in a hospital or clinic setting. Talk to your pediatrician regarding the use of this medicine in children. Special care may be needed. Overdosage: If you think you have taken too much of this medicine contact a poison control center or emergency room at once. NOTE: This medicine is only for you. Do not share this medicine with others. What if I miss a dose? It is important not to miss your dose. Call your doctor or health care professional if you are unable to keep an appointment. What may interact with this medicine? Do not take this medicine with any of the following medications: -clozapine This medicine may also interact with the following medications: -atazanavir -cimetidine -ciprofloxacin -enoxacin -fluvoxamine -medicines for seizures like carbamazepine and phenobarbital -mexiletine -rifampin -tacrine -thiabendazole -zileuton This list may not describe all possible interactions. Give your health care provider a list of all the medicines, herbs, non-prescription drugs, or dietary supplements you use. Also tell them if you smoke, drink  alcohol, or use illegal drugs. Some items may interact with your medicine. What should I watch for while using this medicine? This drug may make you feel generally unwell. This is not uncommon, as chemotherapy can affect healthy cells as well as cancer cells. Report any side effects. Continue your course of treatment even though you feel ill unless your doctor tells you to stop. You  may need blood work done while you are taking this medicine. Call your doctor or health care professional for advice if you get a fever, chills or sore throat, or other symptoms of a cold or flu. Do not treat yourself. This drug decreases your body's ability to fight infections. Try to avoid being around people who are sick. This medicine may increase your risk to bruise or bleed. Call your doctor or health care professional if you notice any unusual bleeding. Talk to your doctor about your risk of cancer. You may be more at risk for certain types of cancers if you take this medicine. Do not become pregnant while taking this medicine or for 3 months after stopping it. Women should inform their doctor if they wish to become pregnant or think they might be pregnant. Men should not father a child while taking this medicine and for 3 months after stopping it.There is a potential for serious side effects to an unborn child. Talk to your health care professional or pharmacist for more information. Do not breast-feed an infant while taking this medicine. This medicine may interfere with the ability to have a child. You should talk with your doctor or health care professional if you are concerned about your fertility. What side effects may I notice from receiving this medicine? Side effects that you should report to your doctor or health care professional as soon as possible: -allergic reactions like skin rash, itching or hives, swelling of the face, lips, or tongue -low blood counts - this medicine may decrease the number of white blood cells, red blood cells and platelets. You may be at increased risk for infections and bleeding. -redness, blistering, peeling or loosening of the skin, including inside the mouth -signs of infection - fever or chills, cough, sore throat, pain or difficulty passing urine -signs of decreased platelets or bleeding - bruising, pinpoint red spots on the skin, black, tarry stools,  blood in the urine -signs of decreased red blood cells - unusually weak or tired, fainting spells, lightheadedness -signs and symptoms of kidney injury like trouble passing urine or change in the amount of urine -signs and symptoms of liver injury like dark yellow or brown urine; general ill feeling or flu-like symptoms; light-colored stools; loss of appetite; nausea; right upper belly pain; unusually weak or tired; yellowing of the eyes or skin Side effects that usually do not require medical attention (report to your doctor or health care professional if they continue or are bothersome): -constipation -decreased appetite -diarrhea -headache -mouth sores -nausea/vomiting -tiredness This list may not describe all possible side effects. Call your doctor for medical advice about side effects. You may report side effects to FDA at 1-800-FDA-1088. Where should I keep my medicine? This drug is given in a hospital or clinic and will not be stored at home. NOTE: This sheet is a summary. It may not cover all possible information. If you have questions about this medicine, talk to your doctor, pharmacist, or health care provider.  2018 Elsevier/Gold Standard (2014-10-27 08:45:41) Rituximab injection What is this medicine? RITUXIMAB (ri TUX i mab) is a  monoclonal antibody. It is used to treat certain types of cancer like non-Hodgkin lymphoma and chronic lymphocytic leukemia. It is also used to treat rheumatoid arthritis, granulomatosis with polyangiitis (or Wegener's granulomatosis), and microscopic polyangiitis. This medicine may be used for other purposes; ask your health care provider or pharmacist if you have questions. COMMON BRAND NAME(S): Rituxan What should I tell my health care provider before I take this medicine? They need to know if you have any of these conditions: -heart disease -infection (especially a virus infection such as hepatitis B, chickenpox, cold sores, or herpes) -immune  system problems -irregular heartbeat -kidney disease -lung or breathing disease, like asthma -recently received or scheduled to receive a vaccine -an unusual or allergic reaction to rituximab, mouse proteins, other medicines, foods, dyes, or preservatives -pregnant or trying to get pregnant -breast-feeding How should I use this medicine? This medicine is for infusion into a vein. It is administered in a hospital or clinic by a specially trained health care professional. A special MedGuide will be given to you by the pharmacist with each prescription and refill. Be sure to read this information carefully each time. Talk to your pediatrician regarding the use of this medicine in children. This medicine is not approved for use in children. Overdosage: If you think you have taken too much of this medicine contact a poison control center or emergency room at once. NOTE: This medicine is only for you. Do not share this medicine with others. What if I miss a dose? It is important not to miss a dose. Call your doctor or health care professional if you are unable to keep an appointment. What may interact with this medicine? -cisplatin -other medicines for arthritis like disease modifying antirheumatic drugs or tumor necrosis factor inhibitors -live virus vaccines This list may not describe all possible interactions. Give your health care provider a list of all the medicines, herbs, non-prescription drugs, or dietary supplements you use. Also tell them if you smoke, drink alcohol, or use illegal drugs. Some items may interact with your medicine. What should I watch for while using this medicine? Your condition will be monitored carefully while you are receiving this medicine. You may need blood work done while you are taking this medicine. This medicine can cause serious allergic reactions. To reduce your risk you may need to take medicine before treatment with this medicine. Take your medicine as  directed. In some patients, this medicine may cause a serious brain infection that may cause death. If you have any problems seeing, thinking, speaking, walking, or standing, tell your doctor right away. If you cannot reach your doctor, urgently seek other source of medical care. Call your doctor or health care professional for advice if you get a fever, chills or sore throat, or other symptoms of a cold or flu. Do not treat yourself. This drug decreases your body's ability to fight infections. Try to avoid being around people who are sick. Do not become pregnant while taking this medicine or for 12 months after stopping it. Women should inform their doctor if they wish to become pregnant or think they might be pregnant. There is a potential for serious side effects to an unborn child. Talk to your health care professional or pharmacist for more information. What side effects may I notice from receiving this medicine? Side effects that you should report to your doctor or health care professional as soon as possible: -breathing problems -chest pain -dizziness or feeling faint -fast, irregular heartbeat -low blood  counts - this medicine may decrease the number of white blood cells, red blood cells and platelets. You may be at increased risk for infections and bleeding. -mouth sores -redness, blistering, peeling or loosening of the skin, including inside the mouth (this can be added for any serious or exfoliative rash that could lead to hospitalization) -signs of infection - fever or chills, cough, sore throat, pain or difficulty passing urine -signs and symptoms of kidney injury like trouble passing urine or change in the amount of urine -signs and symptoms of liver injury like dark yellow or brown urine; general ill feeling or flu-like symptoms; light-colored stools; loss of appetite; nausea; right upper belly pain; unusually weak or tired; yellowing of the eyes or skin -stomach pain -vomiting Side  effects that usually do not require medical attention (report to your doctor or health care professional if they continue or are bothersome): -headache -joint pain -muscle cramps or muscle pain This list may not describe all possible side effects. Call your doctor for medical advice about side effects. You may report side effects to FDA at 1-800-FDA-1088. Where should I keep my medicine? This drug is given in a hospital or clinic and will not be stored at home. NOTE: This sheet is a summary. It may not cover all possible information. If you have questions about this medicine, talk to your doctor, pharmacist, or health care provider.  2018 Elsevier/Gold Standard (2015-08-02 15:28:09)

## 2016-05-09 NOTE — Patient Instructions (Signed)
Implanted Port Home Guide An implanted port is a type of central line that is placed under the skin. Central lines are used to provide IV access when treatment or nutrition needs to be given through a person's veins. Implanted ports are used for long-term IV access. An implanted port may be placed because:  You need IV medicine that would be irritating to the small veins in your hands or arms.  You need long-term IV medicines, such as antibiotics.  You need IV nutrition for a long period.  You need frequent blood draws for lab tests.  You need dialysis.  Implanted ports are usually placed in the chest area, but they can also be placed in the upper arm, the abdomen, or the leg. An implanted port has two main parts:  Reservoir. The reservoir is round and will appear as a small, raised area under your skin. The reservoir is the part where a needle is inserted to give medicines or draw blood.  Catheter. The catheter is a thin, flexible tube that extends from the reservoir. The catheter is placed into a large vein. Medicine that is inserted into the reservoir goes into the catheter and then into the vein.  How will I care for my incision site? Do not get the incision site wet. Bathe or shower as directed by your health care provider. How is my port accessed? Special steps must be taken to access the port:  Before the port is accessed, a numbing cream can be placed on the skin. This helps numb the skin over the port site.  Your health care provider uses a sterile technique to access the port. ? Your health care provider must put on a mask and sterile gloves. ? The skin over your port is cleaned carefully with an antiseptic and allowed to dry. ? The port is gently pinched between sterile gloves, and a needle is inserted into the port.  Only "non-coring" port needles should be used to access the port. Once the port is accessed, a blood return should be checked. This helps ensure that the port  is in the vein and is not clogged.  If your port needs to remain accessed for a constant infusion, a clear (transparent) bandage will be placed over the needle site. The bandage and needle will need to be changed every week, or as directed by your health care provider.  Keep the bandage covering the needle clean and dry. Do not get it wet. Follow your health care provider's instructions on how to take a shower or bath while the port is accessed.  If your port does not need to stay accessed, no bandage is needed over the port.  What is flushing? Flushing helps keep the port from getting clogged. Follow your health care provider's instructions on how and when to flush the port. Ports are usually flushed with saline solution or a medicine called heparin. The need for flushing will depend on how the port is used.  If the port is used for intermittent medicines or blood draws, the port will need to be flushed: ? After medicines have been given. ? After blood has been drawn. ? As part of routine maintenance.  If a constant infusion is running, the port may not need to be flushed.  How long will my port stay implanted? The port can stay in for as long as your health care provider thinks it is needed. When it is time for the port to come out, surgery will be   done to remove it. The procedure is similar to the one performed when the port was put in. When should I seek immediate medical care? When you have an implanted port, you should seek immediate medical care if:  You notice a bad smell coming from the incision site.  You have swelling, redness, or drainage at the incision site.  You have more swelling or pain at the port site or the surrounding area.  You have a fever that is not controlled with medicine.  This information is not intended to replace advice given to you by your health care provider. Make sure you discuss any questions you have with your health care provider. Document  Released: 12/24/2004 Document Revised: 06/01/2015 Document Reviewed: 08/31/2012 [employer] Interactive Patient Education  2017 [employer]  

## 2016-05-10 ENCOUNTER — Other Ambulatory Visit: Payer: Self-pay | Admitting: *Deleted

## 2016-05-10 ENCOUNTER — Ambulatory Visit (HOSPITAL_BASED_OUTPATIENT_CLINIC_OR_DEPARTMENT_OTHER): Payer: Medicare Other

## 2016-05-10 DIAGNOSIS — Z5111 Encounter for antineoplastic chemotherapy: Secondary | ICD-10-CM | POA: Diagnosis not present

## 2016-05-10 DIAGNOSIS — C8589 Other specified types of non-Hodgkin lymphoma, extranodal and solid organ sites: Secondary | ICD-10-CM

## 2016-05-10 DIAGNOSIS — C8339 Diffuse large B-cell lymphoma, extranodal and solid organ sites: Secondary | ICD-10-CM

## 2016-05-10 MED ORDER — ONDANSETRON HCL 4 MG PO TABS: 4.0000 mg | ORAL_TABLET | Freq: Three times a day (TID) | ORAL | 0 refills | Status: DC | PRN

## 2016-05-10 MED ORDER — SODIUM CHLORIDE 0.9% FLUSH
10.0000 mL | INTRAVENOUS | Status: DC | PRN
  Administered 2016-05-10: 10 mL
  Filled 2016-05-10: qty 10

## 2016-05-10 MED ORDER — DEXAMETHASONE SODIUM PHOSPHATE 10 MG/ML IJ SOLN
10.0000 mg | Freq: Once | INTRAMUSCULAR | Status: AC
  Administered 2016-05-10: 10 mg via INTRAVENOUS

## 2016-05-10 MED ORDER — SODIUM CHLORIDE 0.9 % IV SOLN
Freq: Once | INTRAVENOUS | Status: AC
  Administered 2016-05-10: 15:00:00 via INTRAVENOUS

## 2016-05-10 MED ORDER — HEPARIN SOD (PORK) LOCK FLUSH 100 UNIT/ML IV SOLN
500.0000 [IU] | Freq: Once | INTRAVENOUS | Status: AC | PRN
  Administered 2016-05-10: 500 [IU]
  Filled 2016-05-10: qty 5

## 2016-05-10 MED ORDER — DEXAMETHASONE 4 MG PO TABS: ORAL_TABLET | ORAL | 0 refills | Status: DC

## 2016-05-10 MED ORDER — BENDAMUSTINE HCL CHEMO INJECTION 100 MG/4ML
81.0000 mg/m2 | Freq: Once | INTRAVENOUS | Status: AC
  Administered 2016-05-10: 175 mg via INTRAVENOUS
  Filled 2016-05-10: qty 7

## 2016-05-10 MED ORDER — DEXAMETHASONE SODIUM PHOSPHATE 10 MG/ML IJ SOLN
INTRAMUSCULAR | Status: AC
  Filled 2016-05-10: qty 1

## 2016-05-10 MED FILL — ONDANSETRON HCL 4 MG TABLET: 4 | 10 days supply | Qty: 30 | Fill #0

## 2016-05-10 MED FILL — DEXAMETHASONE 4 MG TABLET: 4 | 15 days supply | Qty: 30 | Fill #0

## 2016-05-10 NOTE — Patient Instructions (Signed)
Bendamustine Injection What is this medicine? BENDAMUSTINE (BEN da MUS teen) is a chemotherapy drug. It is used to treat chronic lymphocytic leukemia and non-Hodgkin lymphoma. This medicine may be used for other purposes; ask your health care provider or pharmacist if you have questions. COMMON BRAND NAME(S): BENDEKA, Treanda What should I tell my health care provider before I take this medicine? They need to know if you have any of these conditions: -infection (especially a virus infection such as chickenpox, cold sores, or herpes) -kidney disease -liver disease -an unusual or allergic reaction to bendamustine, mannitol, other medicines, foods, dyes, or preservatives -pregnant or trying to get pregnant -breast-feeding How should I use this medicine? This medicine is for infusion into a vein. It is given by a health care professional in a hospital or clinic setting. Talk to your pediatrician regarding the use of this medicine in children. Special care may be needed. Overdosage: If you think you have taken too much of this medicine contact a poison control center or emergency room at once. NOTE: This medicine is only for you. Do not share this medicine with others. What if I miss a dose? It is important not to miss your dose. Call your doctor or health care professional if you are unable to keep an appointment. What may interact with this medicine? Do not take this medicine with any of the following medications: -clozapine This medicine may also interact with the following medications: -atazanavir -cimetidine -ciprofloxacin -enoxacin -fluvoxamine -medicines for seizures like carbamazepine and phenobarbital -mexiletine -rifampin -tacrine -thiabendazole -zileuton This list may not describe all possible interactions. Give your health care provider a list of all the medicines, herbs, non-prescription drugs, or dietary supplements you use. Also tell them if you smoke, drink alcohol, or  use illegal drugs. Some items may interact with your medicine. What should I watch for while using this medicine? This drug may make you feel generally unwell. This is not uncommon, as chemotherapy can affect healthy cells as well as cancer cells. Report any side effects. Continue your course of treatment even though you feel ill unless your doctor tells you to stop. You may need blood work done while you are taking this medicine. Call your doctor or health care professional for advice if you get a fever, chills or sore throat, or other symptoms of a cold or flu. Do not treat yourself. This drug decreases your body's ability to fight infections. Try to avoid being around people who are sick. This medicine may increase your risk to bruise or bleed. Call your doctor or health care professional if you notice any unusual bleeding. Talk to your doctor about your risk of cancer. You may be more at risk for certain types of cancers if you take this medicine. Do not become pregnant while taking this medicine or for 3 months after stopping it. Women should inform their doctor if they wish to become pregnant or think they might be pregnant. Men should not father a child while taking this medicine and for 3 months after stopping it.There is a potential for serious side effects to an unborn child. Talk to your health care professional or pharmacist for more information. Do not breast-feed an infant while taking this medicine. This medicine may interfere with the ability to have a child. You should talk with your doctor or health care professional if you are concerned about your fertility. What side effects may I notice from receiving this medicine? Side effects that you should report to your doctor   or health care professional as soon as possible: -allergic reactions like skin rash, itching or hives, swelling of the face, lips, or tongue -low blood counts - this medicine may decrease the number of white blood cells,  red blood cells and platelets. You may be at increased risk for infections and bleeding. -redness, blistering, peeling or loosening of the skin, including inside the mouth -signs of infection - fever or chills, cough, sore throat, pain or difficulty passing urine -signs of decreased platelets or bleeding - bruising, pinpoint red spots on the skin, black, tarry stools, blood in the urine -signs of decreased red blood cells - unusually weak or tired, fainting spells, lightheadedness -signs and symptoms of kidney injury like trouble passing urine or change in the amount of urine -signs and symptoms of liver injury like dark yellow or brown urine; general ill feeling or flu-like symptoms; light-colored stools; loss of appetite; nausea; right upper belly pain; unusually weak or tired; yellowing of the eyes or skin Side effects that usually do not require medical attention (report to your doctor or health care professional if they continue or are bothersome): -constipation -decreased appetite -diarrhea -headache -mouth sores -nausea/vomiting -tiredness This list may not describe all possible side effects. Call your doctor for medical advice about side effects. You may report side effects to FDA at 1-800-FDA-1088. Where should I keep my medicine? This drug is given in a hospital or clinic and will not be stored at home. NOTE: This sheet is a summary. It may not cover all possible information. If you have questions about this medicine, talk to your doctor, pharmacist, or health care provider.  2018 Elsevier/Gold Standard (2014-10-27 08:45:41)  

## 2016-05-13 ENCOUNTER — Ambulatory Visit (INDEPENDENT_AMBULATORY_CARE_PROVIDER_SITE_OTHER): Payer: Medicare Other | Admitting: General Practice

## 2016-05-13 ENCOUNTER — Telehealth: Payer: Self-pay | Admitting: *Deleted

## 2016-05-13 DIAGNOSIS — I480 Paroxysmal atrial fibrillation: Secondary | ICD-10-CM

## 2016-05-13 NOTE — Telephone Encounter (Signed)
PAtient called stating that he has had back pain that he has been treated for in the past. Patient is still experiencing pain in the back.  Wanted to ask Dr. Marin Olp if he could assist with this.  Dr. Marin Olp states it would be best if patient continues care for back pain with his original doctor that treated him for his back pain.  Patient understanding of this.

## 2016-05-13 NOTE — Patient Instructions (Signed)
Pre visit review using our clinic review tool, if applicable. No additional management support is needed unless otherwise documented below in the visit note. 

## 2016-05-17 ENCOUNTER — Ambulatory Visit: Payer: Medicare Other

## 2016-05-17 ENCOUNTER — Other Ambulatory Visit (HOSPITAL_BASED_OUTPATIENT_CLINIC_OR_DEPARTMENT_OTHER): Payer: Medicare Other

## 2016-05-17 ENCOUNTER — Ambulatory Visit (HOSPITAL_BASED_OUTPATIENT_CLINIC_OR_DEPARTMENT_OTHER): Payer: Medicare Other

## 2016-05-17 VITALS — BP 118/59 | HR 74 | Temp 97.7°F | Resp 16

## 2016-05-17 DIAGNOSIS — C8589 Other specified types of non-Hodgkin lymphoma, extranodal and solid organ sites: Secondary | ICD-10-CM

## 2016-05-17 DIAGNOSIS — Z5112 Encounter for antineoplastic immunotherapy: Secondary | ICD-10-CM

## 2016-05-17 DIAGNOSIS — C8339 Diffuse large B-cell lymphoma, extranodal and solid organ sites: Secondary | ICD-10-CM

## 2016-05-17 DIAGNOSIS — B370 Candidal stomatitis: Secondary | ICD-10-CM

## 2016-05-17 DIAGNOSIS — Z7901 Long term (current) use of anticoagulants: Secondary | ICD-10-CM

## 2016-05-17 DIAGNOSIS — B3781 Candidal esophagitis: Secondary | ICD-10-CM

## 2016-05-17 DIAGNOSIS — E860 Dehydration: Secondary | ICD-10-CM

## 2016-05-17 LAB — CBC WITH DIFFERENTIAL (CANCER CENTER ONLY)
BASO#: 0 10*3/uL (ref 0.0–0.2)
BASO%: 0.4 % (ref 0.0–2.0)
EOS%: 3.2 % (ref 0.0–7.0)
Eosinophils Absolute: 0.2 10*3/uL (ref 0.0–0.5)
HCT: 31.4 % — ABNORMAL LOW (ref 38.7–49.9)
HGB: 10.3 g/dL — ABNORMAL LOW (ref 13.0–17.1)
LYMPH#: 0.2 10*3/uL — ABNORMAL LOW (ref 0.9–3.3)
LYMPH%: 4.5 % — AB (ref 14.0–48.0)
MCH: 29.8 pg (ref 28.0–33.4)
MCHC: 32.8 g/dL (ref 32.0–35.9)
MCV: 91 fL (ref 82–98)
MONO#: 0.5 10*3/uL (ref 0.1–0.9)
MONO%: 10.5 % (ref 0.0–13.0)
NEUT#: 3.8 10*3/uL (ref 1.5–6.5)
NEUT%: 81.4 % — AB (ref 40.0–80.0)
PLATELETS: 141 10*3/uL — AB (ref 145–400)
RBC: 3.46 10*6/uL — ABNORMAL LOW (ref 4.20–5.70)
RDW: 14.8 % (ref 11.1–15.7)
WBC: 4.7 10*3/uL (ref 4.0–10.0)

## 2016-05-17 LAB — CMP (CANCER CENTER ONLY)
ALK PHOS: 96 U/L — AB (ref 26–84)
ALT: 17 U/L (ref 10–47)
AST: 22 U/L (ref 11–38)
Albumin: 3.1 g/dL — ABNORMAL LOW (ref 3.3–5.5)
BUN, Bld: 11 mg/dL (ref 7–22)
CALCIUM: 8.9 mg/dL (ref 8.0–10.3)
CHLORIDE: 105 meq/L (ref 98–108)
CO2: 29 meq/L (ref 18–33)
Creat: 0.9 mg/dl (ref 0.6–1.2)
Glucose, Bld: 153 mg/dL — ABNORMAL HIGH (ref 73–118)
POTASSIUM: 3.7 meq/L (ref 3.3–4.7)
Sodium: 140 mEq/L (ref 128–145)
Total Bilirubin: 0.6 mg/dl (ref 0.20–1.60)
Total Protein: 6.1 g/dL — ABNORMAL LOW (ref 6.4–8.1)

## 2016-05-17 LAB — PROTIME-INR (CHCC SATELLITE)
INR: 3.8 — AB (ref 2.0–3.5)
PROTIME: 45.6 s — AB (ref 10.6–13.4)

## 2016-05-17 MED ORDER — BORTEZOMIB CHEMO SQ INJECTION 3.5 MG (2.5MG/ML)
1.3000 mg/m2 | Freq: Once | INTRAMUSCULAR | Status: AC
  Administered 2016-05-17: 3 mg via SUBCUTANEOUS
  Filled 2016-05-17: qty 3

## 2016-05-17 MED ORDER — PROCHLORPERAZINE MALEATE 10 MG PO TABS
10.0000 mg | ORAL_TABLET | Freq: Four times a day (QID) | ORAL | Status: DC | PRN
  Administered 2016-05-17: 10 mg via ORAL

## 2016-05-17 MED ORDER — PROCHLORPERAZINE MALEATE 10 MG PO TABS
ORAL_TABLET | ORAL | Status: AC
  Filled 2016-05-17: qty 1

## 2016-05-17 NOTE — Patient Instructions (Signed)

## 2016-05-17 NOTE — Patient Instructions (Signed)
Implanted Port Home Guide An implanted port is a type of central line that is placed under the skin. Central lines are used to provide IV access when treatment or nutrition needs to be given through a person's veins. Implanted ports are used for long-term IV access. An implanted port may be placed because:  You need IV medicine that would be irritating to the small veins in your hands or arms.  You need long-term IV medicines, such as antibiotics.  You need IV nutrition for a long period.  You need frequent blood draws for lab tests.  You need dialysis.  Implanted ports are usually placed in the chest area, but they can also be placed in the upper arm, the abdomen, or the leg. An implanted port has two main parts:  Reservoir. The reservoir is round and will appear as a small, raised area under your skin. The reservoir is the part where a needle is inserted to give medicines or draw blood.  Catheter. The catheter is a thin, flexible tube that extends from the reservoir. The catheter is placed into a large vein. Medicine that is inserted into the reservoir goes into the catheter and then into the vein.  How will I care for my incision site? Do not get the incision site wet. Bathe or shower as directed by your health care provider. How is my port accessed? Special steps must be taken to access the port:  Before the port is accessed, a numbing cream can be placed on the skin. This helps numb the skin over the port site.  Your health care provider uses a sterile technique to access the port. ? Your health care provider must put on a mask and sterile gloves. ? The skin over your port is cleaned carefully with an antiseptic and allowed to dry. ? The port is gently pinched between sterile gloves, and a needle is inserted into the port.  Only "non-coring" port needles should be used to access the port. Once the port is accessed, a blood return should be checked. This helps ensure that the port  is in the vein and is not clogged.  If your port needs to remain accessed for a constant infusion, a clear (transparent) bandage will be placed over the needle site. The bandage and needle will need to be changed every week, or as directed by your health care provider.  Keep the bandage covering the needle clean and dry. Do not get it wet. Follow your health care provider's instructions on how to take a shower or bath while the port is accessed.  If your port does not need to stay accessed, no bandage is needed over the port.  What is flushing? Flushing helps keep the port from getting clogged. Follow your health care provider's instructions on how and when to flush the port. Ports are usually flushed with saline solution or a medicine called heparin. The need for flushing will depend on how the port is used.  If the port is used for intermittent medicines or blood draws, the port will need to be flushed: ? After medicines have been given. ? After blood has been drawn. ? As part of routine maintenance.  If a constant infusion is running, the port may not need to be flushed.  How long will my port stay implanted? The port can stay in for as long as your health care provider thinks it is needed. When it is time for the port to come out, surgery will be   done to remove it. The procedure is similar to the one performed when the port was put in. When should I seek immediate medical care? When you have an implanted port, you should seek immediate medical care if:  You notice a bad smell coming from the incision site.  You have swelling, redness, or drainage at the incision site.  You have more swelling or pain at the port site or the surrounding area.  You have a fever that is not controlled with medicine.  This information is not intended to replace advice given to you by your health care provider. Make sure you discuss any questions you have with your health care provider. Document  Released: 12/24/2004 Document Revised: 06/01/2015 Document Reviewed: 08/31/2012 [employer] Interactive Patient Education  2017 [employer]  

## 2016-05-24 ENCOUNTER — Ambulatory Visit: Payer: Medicare Other

## 2016-05-24 ENCOUNTER — Other Ambulatory Visit (HOSPITAL_BASED_OUTPATIENT_CLINIC_OR_DEPARTMENT_OTHER): Payer: Medicare Other

## 2016-05-24 ENCOUNTER — Ambulatory Visit (HOSPITAL_BASED_OUTPATIENT_CLINIC_OR_DEPARTMENT_OTHER): Payer: Medicare Other

## 2016-05-24 DIAGNOSIS — C8589 Other specified types of non-Hodgkin lymphoma, extranodal and solid organ sites: Secondary | ICD-10-CM

## 2016-05-24 DIAGNOSIS — Z5112 Encounter for antineoplastic immunotherapy: Secondary | ICD-10-CM

## 2016-05-24 DIAGNOSIS — Z7901 Long term (current) use of anticoagulants: Secondary | ICD-10-CM | POA: Diagnosis not present

## 2016-05-24 DIAGNOSIS — E860 Dehydration: Secondary | ICD-10-CM

## 2016-05-24 DIAGNOSIS — C8339 Diffuse large B-cell lymphoma, extranodal and solid organ sites: Secondary | ICD-10-CM

## 2016-05-24 DIAGNOSIS — B370 Candidal stomatitis: Secondary | ICD-10-CM

## 2016-05-24 DIAGNOSIS — B3781 Candidal esophagitis: Secondary | ICD-10-CM

## 2016-05-24 LAB — CMP (CANCER CENTER ONLY)
ALBUMIN: 3.1 g/dL — AB (ref 3.3–5.5)
ALK PHOS: 84 U/L (ref 26–84)
ALT: 19 U/L (ref 10–47)
AST: 22 U/L (ref 11–38)
BUN, Bld: 10 mg/dL (ref 7–22)
CALCIUM: 8.8 mg/dL (ref 8.0–10.3)
CO2: 28 mEq/L (ref 18–33)
Chloride: 97 mEq/L — ABNORMAL LOW (ref 98–108)
Creat: 1.1 mg/dl (ref 0.6–1.2)
Glucose, Bld: 126 mg/dL — ABNORMAL HIGH (ref 73–118)
POTASSIUM: 3.5 meq/L (ref 3.3–4.7)
Sodium: 137 mEq/L (ref 128–145)
Total Bilirubin: 0.7 mg/dl (ref 0.20–1.60)
Total Protein: 6.2 g/dL — ABNORMAL LOW (ref 6.4–8.1)

## 2016-05-24 LAB — CBC WITH DIFFERENTIAL (CANCER CENTER ONLY)
BASO#: 0 10*3/uL (ref 0.0–0.2)
BASO%: 0.9 % (ref 0.0–2.0)
EOS ABS: 0.1 10*3/uL (ref 0.0–0.5)
EOS%: 2.9 % (ref 0.0–7.0)
HEMATOCRIT: 31.3 % — AB (ref 38.7–49.9)
HGB: 10.5 g/dL — ABNORMAL LOW (ref 13.0–17.1)
LYMPH#: 0.4 10*3/uL — ABNORMAL LOW (ref 0.9–3.3)
LYMPH%: 8.9 % — ABNORMAL LOW (ref 14.0–48.0)
MCH: 30.5 pg (ref 28.0–33.4)
MCHC: 33.5 g/dL (ref 32.0–35.9)
MCV: 91 fL (ref 82–98)
MONO#: 0.7 10*3/uL (ref 0.1–0.9)
MONO%: 14.4 % — ABNORMAL HIGH (ref 0.0–13.0)
NEUT%: 72.9 % (ref 40.0–80.0)
NEUTROS ABS: 3.3 10*3/uL (ref 1.5–6.5)
Platelets: 111 10*3/uL — ABNORMAL LOW (ref 145–400)
RBC: 3.44 10*6/uL — ABNORMAL LOW (ref 4.20–5.70)
RDW: 15.4 % (ref 11.1–15.7)
WBC: 4.5 10*3/uL (ref 4.0–10.0)

## 2016-05-24 LAB — PROTIME-INR (CHCC SATELLITE)
INR: 3.6 — AB (ref 2.0–3.5)
Protime: 43.2 Seconds — ABNORMAL HIGH (ref 10.6–13.4)

## 2016-05-24 MED ORDER — PROCHLORPERAZINE MALEATE 10 MG PO TABS
ORAL_TABLET | ORAL | Status: AC
  Filled 2016-05-24: qty 1

## 2016-05-24 MED ORDER — BORTEZOMIB CHEMO SQ INJECTION 3.5 MG (2.5MG/ML)
1.3000 mg/m2 | Freq: Once | INTRAMUSCULAR | Status: AC
  Administered 2016-05-24: 3 mg via SUBCUTANEOUS
  Filled 2016-05-24: qty 3

## 2016-05-24 MED ORDER — PROCHLORPERAZINE MALEATE 10 MG PO TABS
10.0000 mg | ORAL_TABLET | Freq: Four times a day (QID) | ORAL | Status: DC | PRN
  Administered 2016-05-24: 10 mg via ORAL

## 2016-05-24 NOTE — Patient Instructions (Signed)

## 2016-05-24 NOTE — Patient Instructions (Signed)
Implanted Port Home Guide An implanted port is a type of central line that is placed under the skin. Central lines are used to provide IV access when treatment or nutrition needs to be given through a person's veins. Implanted ports are used for long-term IV access. An implanted port may be placed because:  You need IV medicine that would be irritating to the small veins in your hands or arms.  You need long-term IV medicines, such as antibiotics.  You need IV nutrition for a long period.  You need frequent blood draws for lab tests.  You need dialysis.  Implanted ports are usually placed in the chest area, but they can also be placed in the upper arm, the abdomen, or the leg. An implanted port has two main parts:  Reservoir. The reservoir is round and will appear as a small, raised area under your skin. The reservoir is the part where a needle is inserted to give medicines or draw blood.  Catheter. The catheter is a thin, flexible tube that extends from the reservoir. The catheter is placed into a large vein. Medicine that is inserted into the reservoir goes into the catheter and then into the vein.  How will I care for my incision site? Do not get the incision site wet. Bathe or shower as directed by your health care provider. How is my port accessed? Special steps must be taken to access the port:  Before the port is accessed, a numbing cream can be placed on the skin. This helps numb the skin over the port site.  Your health care provider uses a sterile technique to access the port. ? Your health care provider must put on a mask and sterile gloves. ? The skin over your port is cleaned carefully with an antiseptic and allowed to dry. ? The port is gently pinched between sterile gloves, and a needle is inserted into the port.  Only "non-coring" port needles should be used to access the port. Once the port is accessed, a blood return should be checked. This helps ensure that the port  is in the vein and is not clogged.  If your port needs to remain accessed for a constant infusion, a clear (transparent) bandage will be placed over the needle site. The bandage and needle will need to be changed every week, or as directed by your health care provider.  Keep the bandage covering the needle clean and dry. Do not get it wet. Follow your health care provider's instructions on how to take a shower or bath while the port is accessed.  If your port does not need to stay accessed, no bandage is needed over the port.  What is flushing? Flushing helps keep the port from getting clogged. Follow your health care provider's instructions on how and when to flush the port. Ports are usually flushed with saline solution or a medicine called heparin. The need for flushing will depend on how the port is used.  If the port is used for intermittent medicines or blood draws, the port will need to be flushed: ? After medicines have been given. ? After blood has been drawn. ? As part of routine maintenance.  If a constant infusion is running, the port may not need to be flushed.  How long will my port stay implanted? The port can stay in for as long as your health care provider thinks it is needed. When it is time for the port to come out, surgery will be   done to remove it. The procedure is similar to the one performed when the port was put in. When should I seek immediate medical care? When you have an implanted port, you should seek immediate medical care if:  You notice a bad smell coming from the incision site.  You have swelling, redness, or drainage at the incision site.  You have more swelling or pain at the port site or the surrounding area.  You have a fever that is not controlled with medicine.  This information is not intended to replace advice given to you by your health care provider. Make sure you discuss any questions you have with your health care provider. Document  Released: 12/24/2004 Document Revised: 06/01/2015 Document Reviewed: 08/31/2012 [employer] Interactive Patient Education  2017 [employer]  

## 2016-05-27 ENCOUNTER — Telehealth: Payer: Self-pay | Admitting: *Deleted

## 2016-05-27 NOTE — Telephone Encounter (Signed)
90 day refill request  gabapentin (NEURONTIN) 300 MG capsule take 2 tabs 3 times a day. Okay to fill 540 tablets?

## 2016-05-27 NOTE — Telephone Encounter (Signed)
Refill OK

## 2016-05-28 MED ORDER — GABAPENTIN 300 MG PO CAPS: 600.0000 mg | ORAL_CAPSULE | Freq: Three times a day (TID) | ORAL | 2 refills | Status: DC

## 2016-06-06 ENCOUNTER — Ambulatory Visit: Payer: Medicare Other

## 2016-06-06 ENCOUNTER — Other Ambulatory Visit: Payer: Self-pay | Admitting: Family Medicine

## 2016-06-06 ENCOUNTER — Ambulatory Visit (HOSPITAL_BASED_OUTPATIENT_CLINIC_OR_DEPARTMENT_OTHER): Payer: Medicare Other

## 2016-06-06 ENCOUNTER — Ambulatory Visit (HOSPITAL_BASED_OUTPATIENT_CLINIC_OR_DEPARTMENT_OTHER): Payer: Medicare Other | Admitting: Hematology & Oncology

## 2016-06-06 ENCOUNTER — Other Ambulatory Visit (HOSPITAL_BASED_OUTPATIENT_CLINIC_OR_DEPARTMENT_OTHER): Payer: Medicare Other

## 2016-06-06 VITALS — BP 131/60 | HR 82 | Temp 98.5°F | Resp 18 | Wt 243.8 lb

## 2016-06-06 VITALS — BP 131/63 | HR 79 | Temp 97.7°F | Resp 18

## 2016-06-06 DIAGNOSIS — C8339 Diffuse large B-cell lymphoma, extranodal and solid organ sites: Secondary | ICD-10-CM | POA: Diagnosis not present

## 2016-06-06 DIAGNOSIS — C8589 Other specified types of non-Hodgkin lymphoma, extranodal and solid organ sites: Secondary | ICD-10-CM

## 2016-06-06 DIAGNOSIS — Z5111 Encounter for antineoplastic chemotherapy: Secondary | ICD-10-CM

## 2016-06-06 DIAGNOSIS — Z5112 Encounter for antineoplastic immunotherapy: Secondary | ICD-10-CM

## 2016-06-06 LAB — CMP (CANCER CENTER ONLY)
ALBUMIN: 3.1 g/dL — AB (ref 3.3–5.5)
ALK PHOS: 87 U/L — AB (ref 26–84)
ALT: 11 U/L (ref 10–47)
AST: 25 U/L (ref 11–38)
BILIRUBIN TOTAL: 0.6 mg/dL (ref 0.20–1.60)
BUN, Bld: 7 mg/dL (ref 7–22)
CO2: 27 mEq/L (ref 18–33)
Calcium: 8.9 mg/dL (ref 8.0–10.3)
Chloride: 102 mEq/L (ref 98–108)
Creat: 0.9 mg/dl (ref 0.6–1.2)
Glucose, Bld: 138 mg/dL — ABNORMAL HIGH (ref 73–118)
Potassium: 3.7 mEq/L (ref 3.3–4.7)
Sodium: 137 mEq/L (ref 128–145)
TOTAL PROTEIN: 6.1 g/dL — AB (ref 6.4–8.1)

## 2016-06-06 LAB — CBC WITH DIFFERENTIAL (CANCER CENTER ONLY)
BASO#: 0 10*3/uL (ref 0.0–0.2)
BASO%: 0.8 % (ref 0.0–2.0)
EOS%: 5.4 % (ref 0.0–7.0)
Eosinophils Absolute: 0.2 10*3/uL (ref 0.0–0.5)
HEMATOCRIT: 30.9 % — AB (ref 38.7–49.9)
HEMOGLOBIN: 10.1 g/dL — AB (ref 13.0–17.1)
LYMPH#: 0.5 10*3/uL — AB (ref 0.9–3.3)
LYMPH%: 14.1 % (ref 14.0–48.0)
MCH: 29.8 pg (ref 28.0–33.4)
MCHC: 32.7 g/dL (ref 32.0–35.9)
MCV: 91 fL (ref 82–98)
MONO#: 0.5 10*3/uL (ref 0.1–0.9)
MONO%: 13.8 % — ABNORMAL HIGH (ref 0.0–13.0)
NEUT%: 65.9 % (ref 40.0–80.0)
NEUTROS ABS: 2.3 10*3/uL (ref 1.5–6.5)
Platelets: 103 10*3/uL — ABNORMAL LOW (ref 145–400)
RBC: 3.39 10*6/uL — AB (ref 4.20–5.70)
RDW: 15.1 % (ref 11.1–15.7)
WBC: 3.6 10*3/uL — AB (ref 4.0–10.0)

## 2016-06-06 LAB — PROTIME-INR (CHCC SATELLITE)
INR: 2.4 (ref 2.0–3.5)
PROTIME: 28.8 s — AB (ref 10.6–13.4)

## 2016-06-06 LAB — LACTATE DEHYDROGENASE: LDH: 218 U/L (ref 125–245)

## 2016-06-06 MED ORDER — DEXAMETHASONE SODIUM PHOSPHATE 10 MG/ML IJ SOLN
INTRAMUSCULAR | Status: AC
  Filled 2016-06-06: qty 1

## 2016-06-06 MED ORDER — PALONOSETRON HCL INJECTION 0.25 MG/5ML
0.2500 mg | Freq: Once | INTRAVENOUS | Status: AC
  Administered 2016-06-06: 0.25 mg via INTRAVENOUS

## 2016-06-06 MED ORDER — DIPHENHYDRAMINE HCL 25 MG PO CAPS
ORAL_CAPSULE | ORAL | Status: AC
  Filled 2016-06-06: qty 2

## 2016-06-06 MED ORDER — BORTEZOMIB CHEMO SQ INJECTION 3.5 MG (2.5MG/ML)
1.3000 mg/m2 | Freq: Once | INTRAMUSCULAR | Status: AC
  Administered 2016-06-06: 3 mg via SUBCUTANEOUS
  Filled 2016-06-06: qty 3

## 2016-06-06 MED ORDER — SODIUM CHLORIDE 0.9 % IV SOLN
375.0000 mg/m2 | Freq: Once | INTRAVENOUS | Status: AC
  Administered 2016-06-06: 100 mg via INTRAVENOUS
  Filled 2016-06-06: qty 50

## 2016-06-06 MED ORDER — SODIUM CHLORIDE 0.9 % IV SOLN
81.0000 mg/m2 | Freq: Once | INTRAVENOUS | Status: AC
  Administered 2016-06-06: 175 mg via INTRAVENOUS
  Filled 2016-06-06: qty 7

## 2016-06-06 MED ORDER — ACETAMINOPHEN 325 MG PO TABS
650.0000 mg | ORAL_TABLET | Freq: Once | ORAL | Status: AC
  Administered 2016-06-06: 650 mg via ORAL

## 2016-06-06 MED ORDER — SODIUM CHLORIDE 0.9 % IV SOLN
Freq: Once | INTRAVENOUS | Status: AC
  Administered 2016-06-06: 10:00:00 via INTRAVENOUS

## 2016-06-06 MED ORDER — DEXAMETHASONE SODIUM PHOSPHATE 10 MG/ML IJ SOLN
10.0000 mg | Freq: Once | INTRAMUSCULAR | Status: AC
  Administered 2016-06-06: 10 mg via INTRAVENOUS

## 2016-06-06 MED ORDER — PALONOSETRON HCL INJECTION 0.25 MG/5ML
INTRAVENOUS | Status: AC
  Filled 2016-06-06: qty 5

## 2016-06-06 MED ORDER — ACETAMINOPHEN 325 MG PO TABS
ORAL_TABLET | ORAL | Status: AC
  Filled 2016-06-06: qty 2

## 2016-06-06 MED ORDER — HEPARIN SOD (PORK) LOCK FLUSH 100 UNIT/ML IV SOLN
500.0000 [IU] | Freq: Once | INTRAVENOUS | Status: AC | PRN
  Administered 2016-06-06: 500 [IU]
  Filled 2016-06-06: qty 5

## 2016-06-06 MED ORDER — DIPHENHYDRAMINE HCL 25 MG PO CAPS
50.0000 mg | ORAL_CAPSULE | Freq: Once | ORAL | Status: AC
  Administered 2016-06-06: 50 mg via ORAL

## 2016-06-06 MED ORDER — SODIUM CHLORIDE 0.9% FLUSH
10.0000 mL | INTRAVENOUS | Status: DC | PRN
  Administered 2016-06-06: 10 mL
  Filled 2016-06-06: qty 10

## 2016-06-06 NOTE — Progress Notes (Signed)
Hematology and Oncology Follow Up Visit  Gary Yates [id number] 08/03/1933 83 y.o. 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 He did tolerate his first cycle of treatment well. He really had no problems with it.  He does have a lot of other health problems. He has a lot of edema in his legs. His weight is up about 5 pounds since we last saw him. He did not take his diuretic this morning.  He does have diabetes. He is trying to watch his blood sugars.  He's had no abdominal pain. He's had no cough or shortness of breath. He does state occasional dizziness.  He's had no fever. He's had no rashes.   Overall, I would say that his performance status is ECOG 2.  Medications:  Current Outpatient Prescriptions:  .  ACCU-CHEK AVIVA PLUS test strip, TEST 3 TIMES A DAY (Patient taking differently: TEST 2 TIMES A DAY), Disp: 300 each, Rfl: 5 .  acetaminophen (TYLENOL) 500 MG tablet, Take 1,000 mg by mouth every 6 (six) hours as needed (for pain/fever/headaches.). , Disp: , Rfl:  .  atorvastatin (LIPITOR) 40 MG tablet, TAKE 1 TABLET BY MOUTH DAILY, Disp: 90 tablet, Rfl: 2 .  clonazePAM (KLONOPIN) 1 MG tablet, TAKE 1 TABLET EVERY DAY AT BEDTIME AS NEEDED, Disp: 30 tablet, Rfl: 5 .  clopidogrel (PLAVIX) 75 MG tablet, TAKE 1 TABLET BY MOUTH DAILY, Disp: 30 tablet, Rfl: 8 .  dexamethasone (DECADRON) 4 MG tablet, Take 2 tablets with food. Start the day after Bendamustine for 2 days., Disp: 30 tablet, Rfl: 0 .  docusate sodium (COLACE) 50 MG capsule, Take 1 capsule (50 mg total) by mouth 2 (two) times daily as needed (for constipation)., Disp: 90 capsule, Rfl: 0 .  fluconazole (DIFLUCAN) 100 MG tablet, Take 1 tablet (100 mg total) by  mouth daily., Disp: 7 tablet, Rfl: 0 .  furosemide (LASIX) 40 MG tablet, TAKE 1 TABLET BY MOUTH EVERY DAY, Disp: 30 tablet, Rfl: 1 .  gabapentin (NEURONTIN) 300 MG capsule, Take 2 capsules (600 mg total) by mouth 3 (three) times daily., Disp: 540 capsule, Rfl: 2 .  Insulin Glargine (TOUJEO SOLOSTAR) 300 UNIT/ML SOPN, Inject 20 Units into the skin daily after breakfast., Disp: 1 pen, Rfl: 0 .  insulin lispro (HUMALOG) 100 UNIT/ML injection, Inject 4 Units into the skin daily with breakfast. , Disp: , Rfl:  .  metFORMIN (GLUCOPHAGE) 500 MG tablet, TAKE 1 TABLET BY MOUTH TWICE A DAY WITH A MEAL, Disp: 60 tablet, Rfl: 5 .  metoprolol succinate (TOPROL-XL) 100 MG 24 hr tablet, TAKE 1 TABLET EVERY DAY IMMEDIATELY FOLLOWING A MEAL, Disp: 90 tablet, Rfl: 1 .  Multiple Vitamins-Minerals (CENTRUM SILVER 50+MEN) TABS, Take 1 tablet by mouth daily., Disp: , Rfl:  .  nitroGLYCERIN (NITROSTAT) 0.4 MG SL tablet, Place 1 tablet (0.4 mg total) under the tongue every 5 (five) minutes as needed. Chest pain, Disp: 25 tablet, Rfl: 6 .  ondansetron (ZOFRAN) 4 MG tablet, Take 1 tablet (4 mg total) by mouth every 8 (eight) hours as needed for nausea or vomiting., Disp: 30 tablet, Rfl: 0 .  oxybutynin (DITROPAN XL) 15 MG 24 hr tablet, Take 15 mg by mouth every morning., Disp: ,  Rfl:  .  pantoprazole (PROTONIX) 40 MG tablet, TAKE 1 TABLET BY MOUTH EVERY DAY, Disp: 90 tablet, Rfl: 3 .  pramipexole (MIRAPEX) 1.5 MG tablet, MAY TAKE 1 TO 1 AND 1/2 TABLETS BY MOUTH AT BEDTIME, Disp: 135 tablet, Rfl: 2 .  tamsulosin (FLOMAX) 0.4 MG CAPS capsule, Take 0.4 mg by mouth at bedtime., Disp: , Rfl:  .  Trospium Chloride 60 MG CP24, Take 60 mg by mouth daily., Disp: , Rfl:  .  vitamin B-12 (CYANOCOBALAMIN) 1000 MCG tablet, Take 1,000 mcg by mouth daily., Disp: , Rfl:  .  warfarin (COUMADIN) 5 MG tablet, Take as directed by anticoagulation clinic, Disp: 60 tablet, Rfl: 1 No current facility-administered medications for this visit.    Facility-Administered Medications Ordered in Other Visits:  .  testosterone cypionate (DEPOTESTOTERONE CYPIONATE) injection 200 mg, 200 mg, Intramuscular, Q28 days, [name], Alinda Sierras, MD, 200 mg at 12/26/11 0850 .  testosterone cypionate (DEPOTESTOTERONE CYPIONATE) injection 200 mg, 200 mg, Intramuscular, Q28 days, [name], Alinda Sierras, MD, 200 mg at 01/23/12 0908 .  testosterone cypionate (DEPOTESTOTERONE CYPIONATE) injection 200 mg, 200 mg, Intramuscular, Q28 days, [name], Alinda Sierras, MD, 200 mg at 02/25/12 0848 .  testosterone cypionate (DEPOTESTOTERONE CYPIONATE) injection 200 mg, 200 mg, Intramuscular, Q28 days, [name], MD, 200 mg at 06/03/12 1437  Allergies:  Allergies  Allergen Reactions  . Ace Inhibitors Other (See Comments)    cough  . Codeine Nausea Only and Rash       . Penicillins Rash    Childhood allergy Has patient had a PCN reaction causing immediate rash, facial/tongue/throat swelling, SOB or lightheadedness with hypotension: Yes Has patient had a PCN reaction causing severe rash involving mucus membranes or skin necrosis: Yes Has patient had a PCN reaction that required hospitalization No Has patient had a PCN reaction occurring within the last 10 years: No If all of the above answers are "NO", then may proceed with Cephalosporin use.     Past Medical History, Surgical history, Social history, and Family History were reviewed and updated.  Review of Systems:  As above  Physical Exam:  weight is 243 lb 12 oz (110.6 kg). His oral temperature is 98.5 F (36.9 C). His blood pressure is 131/60 and his pulse is 82. His respiration is 18.   Wt Readings from Last 3 Encounters:  06/06/16 243 lb 12 oz (110.6 kg)  06/06/16 243 lb 12 oz (110.6 kg)  05/09/16 236 lb 6.4 oz (107.2 kg)      Elderly white male. He started to lose his hair. Head and neck exam shows no ocular or oral lesions. There are no palpable cervical or supraclavicular lymph nodes. He has  no oral thrush. There is no oral mucositis. There is no palpable adenopathy in the neck. Lungs are clear to percussion and auscultation bilaterally. Cardiac exam regular rate and rhythm consistent with atrial fibrillation. He has no murmurs, rubs or bruits. Abdomen is soft. He has decent bowel sounds. There is no fluid wave. There is no guarding or rebound tenderness. He has no palpable hepatosplenomegaly. He has a healed right inguinal orchiectomy scar. Extremities shows chronic 2+ edema in his legs. He has some stasis dermatitis changes in his legs. Neurological exam shows no focal neurological deficits. Skin exam shows some brawny edema in his lower legs.  Lab Results  Component Value Date   WBC 4.5 05/24/2016   HGB 10.5 (L) 05/24/2016   HCT 31.3 (L) 05/24/2016   MCV 91 05/24/2016  PLT 111 (L) 05/24/2016     Chemistry      Component Value Date/Time   NA 137 05/24/2016 1038   NA 137 12/14/2015 1100   K 3.5 05/24/2016 1038   K 3.7 12/14/2015 1100   CL 97 (L) 05/24/2016 1038   CO2 28 05/24/2016 1038   CO2 24 12/14/2015 1100   BUN 10 05/24/2016 1038   BUN 18.4 12/14/2015 1100   CREATININE 1.1 05/24/2016 1038   CREATININE 0.9 12/14/2015 1100      Component Value Date/Time   CALCIUM 8.8 05/24/2016 1038   CALCIUM 9.5 12/14/2015 1100   ALKPHOS 84 05/24/2016 1038   ALKPHOS 102 12/14/2015 1100   AST 22 05/24/2016 1038   AST 22 12/14/2015 1100   ALT 19 05/24/2016 1038   ALT 10 12/14/2015 1100   BILITOT 0.70 05/24/2016 1038   BILITOT 0.58 12/14/2015 1100         Impression and Plan: Mr. Rajewski is an 83 year old white male. He has relapsed large cell non-Hodgkin lymphoma of the testicle. He is on salvage chemotherapy with Rituxan/bendamustine/Velcade. Again, he is not a candidate for aggressive intervention. He definitely is not a candidate for stem cell transplantation.  After this cycle of treatment, we will do a PET scan and see how things look. Hopefully, we will see that he has  responded.  He will take his diuretics when he gets home.  He is doing the best that he can do. Again, he does have quite a few health issues. He is on quite a few medications.  I'll plan to see him back in one month.   Volanda Napoleon, MD 5/31/20189:09 AM

## 2016-06-06 NOTE — Addendum Note (Signed)
Addended by: Burney Gauze R on: 06/06/2016 09:35 AM   Modules accepted: Orders

## 2016-06-07 ENCOUNTER — Other Ambulatory Visit: Payer: Medicare Other

## 2016-06-07 ENCOUNTER — Inpatient Hospital Stay: Payer: Medicare Other

## 2016-06-07 ENCOUNTER — Ambulatory Visit (HOSPITAL_BASED_OUTPATIENT_CLINIC_OR_DEPARTMENT_OTHER): Payer: Medicare Other

## 2016-06-07 VITALS — BP 136/75 | HR 94 | Temp 97.7°F

## 2016-06-07 DIAGNOSIS — C8589 Other specified types of non-Hodgkin lymphoma, extranodal and solid organ sites: Secondary | ICD-10-CM

## 2016-06-07 DIAGNOSIS — C8339 Diffuse large B-cell lymphoma, extranodal and solid organ sites: Secondary | ICD-10-CM | POA: Diagnosis not present

## 2016-06-07 DIAGNOSIS — Z5111 Encounter for antineoplastic chemotherapy: Secondary | ICD-10-CM | POA: Diagnosis not present

## 2016-06-07 MED ORDER — DEXAMETHASONE SODIUM PHOSPHATE 10 MG/ML IJ SOLN
INTRAMUSCULAR | Status: AC
  Filled 2016-06-07: qty 1

## 2016-06-07 MED ORDER — HEPARIN SOD (PORK) LOCK FLUSH 100 UNIT/ML IV SOLN
500.0000 [IU] | Freq: Once | INTRAVENOUS | Status: AC | PRN
  Administered 2016-06-07: 500 [IU]
  Filled 2016-06-07: qty 5

## 2016-06-07 MED ORDER — SODIUM CHLORIDE 0.9 % IV SOLN
Freq: Once | INTRAVENOUS | Status: AC
  Administered 2016-06-07: 12:00:00 via INTRAVENOUS

## 2016-06-07 MED ORDER — DEXAMETHASONE SODIUM PHOSPHATE 10 MG/ML IJ SOLN
10.0000 mg | Freq: Once | INTRAMUSCULAR | Status: AC
  Administered 2016-06-07: 10 mg via INTRAVENOUS

## 2016-06-07 MED ORDER — SODIUM CHLORIDE 0.9 % IV SOLN
81.0000 mg/m2 | Freq: Once | INTRAVENOUS | Status: AC
  Administered 2016-06-07: 175 mg via INTRAVENOUS
  Filled 2016-06-07: qty 7

## 2016-06-07 MED ORDER — SODIUM CHLORIDE 0.9% FLUSH
10.0000 mL | INTRAVENOUS | Status: DC | PRN
Start: 2016-06-07 — End: 2016-06-07
  Administered 2016-06-07: 10 mL
  Filled 2016-06-07: qty 10

## 2016-06-07 NOTE — Patient Instructions (Signed)
Bendamustine Injection What is this medicine? BENDAMUSTINE (BEN da MUS teen) is a chemotherapy drug. It is used to treat chronic lymphocytic leukemia and non-Hodgkin lymphoma. This medicine may be used for other purposes; ask your health care provider or pharmacist if you have questions. COMMON BRAND NAME(S): BENDEKA, Treanda What should I tell my health care provider before I take this medicine? They need to know if you have any of these conditions: -infection (especially a virus infection such as chickenpox, cold sores, or herpes) -kidney disease -liver disease -an unusual or allergic reaction to bendamustine, mannitol, other medicines, foods, dyes, or preservatives -pregnant or trying to get pregnant -breast-feeding How should I use this medicine? This medicine is for infusion into a vein. It is given by a health care professional in a hospital or clinic setting. Talk to your pediatrician regarding the use of this medicine in children. Special care may be needed. Overdosage: If you think you have taken too much of this medicine contact a poison control center or emergency room at once. NOTE: This medicine is only for you. Do not share this medicine with others. What if I miss a dose? It is important not to miss your dose. Call your doctor or health care professional if you are unable to keep an appointment. What may interact with this medicine? Do not take this medicine with any of the following medications: -clozapine This medicine may also interact with the following medications: -atazanavir -cimetidine -ciprofloxacin -enoxacin -fluvoxamine -medicines for seizures like carbamazepine and phenobarbital -mexiletine -rifampin -tacrine -thiabendazole -zileuton This list may not describe all possible interactions. Give your health care provider a list of all the medicines, herbs, non-prescription drugs, or dietary supplements you use. Also tell them if you smoke, drink alcohol, or  use illegal drugs. Some items may interact with your medicine. What should I watch for while using this medicine? This drug may make you feel generally unwell. This is not uncommon, as chemotherapy can affect healthy cells as well as cancer cells. Report any side effects. Continue your course of treatment even though you feel ill unless your doctor tells you to stop. You may need blood work done while you are taking this medicine. Call your doctor or health care professional for advice if you get a fever, chills or sore throat, or other symptoms of a cold or flu. Do not treat yourself. This drug decreases your body's ability to fight infections. Try to avoid being around people who are sick. This medicine may increase your risk to bruise or bleed. Call your doctor or health care professional if you notice any unusual bleeding. Talk to your doctor about your risk of cancer. You may be more at risk for certain types of cancers if you take this medicine. Do not become pregnant while taking this medicine or for 3 months after stopping it. Women should inform their doctor if they wish to become pregnant or think they might be pregnant. Men should not father a child while taking this medicine and for 3 months after stopping it.There is a potential for serious side effects to an unborn child. Talk to your health care professional or pharmacist for more information. Do not breast-feed an infant while taking this medicine. This medicine may interfere with the ability to have a child. You should talk with your doctor or health care professional if you are concerned about your fertility. What side effects may I notice from receiving this medicine? Side effects that you should report to your doctor   or health care professional as soon as possible: -allergic reactions like skin rash, itching or hives, swelling of the face, lips, or tongue -low blood counts - this medicine may decrease the number of white blood cells,  red blood cells and platelets. You may be at increased risk for infections and bleeding. -redness, blistering, peeling or loosening of the skin, including inside the mouth -signs of infection - fever or chills, cough, sore throat, pain or difficulty passing urine -signs of decreased platelets or bleeding - bruising, pinpoint red spots on the skin, black, tarry stools, blood in the urine -signs of decreased red blood cells - unusually weak or tired, fainting spells, lightheadedness -signs and symptoms of kidney injury like trouble passing urine or change in the amount of urine -signs and symptoms of liver injury like dark yellow or brown urine; general ill feeling or flu-like symptoms; light-colored stools; loss of appetite; nausea; right upper belly pain; unusually weak or tired; yellowing of the eyes or skin Side effects that usually do not require medical attention (report to your doctor or health care professional if they continue or are bothersome): -constipation -decreased appetite -diarrhea -headache -mouth sores -nausea/vomiting -tiredness This list may not describe all possible side effects. Call your doctor for medical advice about side effects. You may report side effects to FDA at 1-800-FDA-1088. Where should I keep my medicine? This drug is given in a hospital or clinic and will not be stored at home. NOTE: This sheet is a summary. It may not cover all possible information. If you have questions about this medicine, talk to your doctor, pharmacist, or health care provider.  2018 Elsevier/Gold Standard (2014-10-27 08:45:41)  

## 2016-06-11 ENCOUNTER — Ambulatory Visit (INDEPENDENT_AMBULATORY_CARE_PROVIDER_SITE_OTHER): Payer: Medicare Other | Admitting: Family Medicine

## 2016-06-11 ENCOUNTER — Encounter: Payer: Self-pay | Admitting: Family Medicine

## 2016-06-11 VITALS — BP 122/66 | HR 86 | Temp 98.7°F | Ht 69.25 in | Wt 243.0 lb

## 2016-06-11 DIAGNOSIS — E1165 Type 2 diabetes mellitus with hyperglycemia: Secondary | ICD-10-CM

## 2016-06-11 DIAGNOSIS — I480 Paroxysmal atrial fibrillation: Secondary | ICD-10-CM

## 2016-06-11 DIAGNOSIS — N183 Chronic kidney disease, stage 3 unspecified: Secondary | ICD-10-CM

## 2016-06-11 DIAGNOSIS — R600 Localized edema: Secondary | ICD-10-CM | POA: Diagnosis not present

## 2016-06-11 DIAGNOSIS — J439 Emphysema, unspecified: Secondary | ICD-10-CM | POA: Diagnosis not present

## 2016-06-11 DIAGNOSIS — I10 Essential (primary) hypertension: Secondary | ICD-10-CM

## 2016-06-11 DIAGNOSIS — E1140 Type 2 diabetes mellitus with diabetic neuropathy, unspecified: Secondary | ICD-10-CM

## 2016-06-11 DIAGNOSIS — G4700 Insomnia, unspecified: Secondary | ICD-10-CM

## 2016-06-11 DIAGNOSIS — E669 Obesity, unspecified: Secondary | ICD-10-CM

## 2016-06-11 MED ORDER — TEMAZEPAM 30 MG PO CAPS: 30.0000 mg | ORAL_CAPSULE | Freq: Every evening | ORAL | 0 refills | Status: DC | PRN

## 2016-06-11 NOTE — Progress Notes (Signed)
   Subjective:    Patient ID: Gary Ahr., male    DOB: [birth year], 83 y.o.   MRN: [id number]  HPI Here for several issues. First he has had 3 months of increased swelling in both lower legs. No SOB. He has some mild discomfort in the legs. He takes Lasix 40 mg once a day. In reviewing his records, I see his diabetes is well controlled with an A1c of 6.3 in February. He has normal renal function, as his creatinine a few weeks ago was 0.9. He is mildly anemic with a Hgb of 10.1. His ECHO from last September showed normal systolic function and an EF of 55-60%. He is currently being treated for lymphoma. The other issue is chronic insomnia. He has dealt with this for years but it has been worse lately. He has trouble both falling and staying asleep.    Review of Systems  Constitutional: Negative.   Respiratory: Negative.   Cardiovascular: Positive for leg swelling. Negative for chest pain and palpitations.  Gastrointestinal: Negative.   Neurological: Negative.        Objective:   Physical Exam  Constitutional: He is oriented to person, place, and time.  Morbidly obese, walks with a walker  Neck: No thyromegaly present.  Cardiovascular: Normal rate, regular rhythm, normal heart sounds and intact distal pulses.   No murmur heard. Pulmonary/Chest: Effort normal and breath sounds normal. No respiratory distress. He has no wheezes. He has no rales.  Musculoskeletal:  4+ edema in both legs to just above the knees   Lymphadenopathy:    He has no cervical adenopathy.  Neurological: He is alert and oriented to person, place, and time.  Psychiatric: He has a normal mood and affect. His behavior is normal. Thought content normal.          Assessment & Plan:  His leg edema is most likely due to venous insufficiency. I advised him to wear knee high compression stockings. Elevate the legs when possible. Increase the Lasix to 40 mg bid. He should follow up with Dr. Elease Hashimoto in 2 weeks. For  sleep he will try Temazepam.  Alysia Penna, MD

## 2016-06-11 NOTE — Patient Instructions (Signed)
WE NOW OFFER   [facility] FAST TRACK!!!  SAME DAY Appointments for ACUTE CARE  Such as: Sprains, Injuries, cuts, abrasions, rashes, muscle pain, joint pain, back pain Colds, flu, sore throats, headache, allergies, cough, fever  Ear pain, sinus and eye infections Abdominal pain, nausea, vomiting, diarrhea, upset stomach Animal/insect bites  3 Easy Ways to Schedule: Walk-In Scheduling Call in scheduling Mychart Sign-up: [email or web address]         

## 2016-06-13 ENCOUNTER — Ambulatory Visit (HOSPITAL_BASED_OUTPATIENT_CLINIC_OR_DEPARTMENT_OTHER): Payer: Medicare Other

## 2016-06-13 ENCOUNTER — Other Ambulatory Visit (HOSPITAL_BASED_OUTPATIENT_CLINIC_OR_DEPARTMENT_OTHER): Payer: Medicare Other

## 2016-06-13 VITALS — BP 116/59 | HR 74 | Temp 97.8°F | Resp 17

## 2016-06-13 DIAGNOSIS — C8339 Diffuse large B-cell lymphoma, extranodal and solid organ sites: Secondary | ICD-10-CM

## 2016-06-13 DIAGNOSIS — C8589 Other specified types of non-Hodgkin lymphoma, extranodal and solid organ sites: Secondary | ICD-10-CM

## 2016-06-13 DIAGNOSIS — Z95828 Presence of other vascular implants and grafts: Secondary | ICD-10-CM

## 2016-06-13 DIAGNOSIS — Z5112 Encounter for antineoplastic immunotherapy: Secondary | ICD-10-CM | POA: Diagnosis not present

## 2016-06-13 LAB — CBC WITH DIFFERENTIAL (CANCER CENTER ONLY)
BASO#: 0 10*3/uL (ref 0.0–0.2)
BASO%: 0.6 % (ref 0.0–2.0)
EOS%: 3.2 % (ref 0.0–7.0)
Eosinophils Absolute: 0.2 10*3/uL (ref 0.0–0.5)
HCT: 31 % — ABNORMAL LOW (ref 38.7–49.9)
HGB: 10.3 g/dL — ABNORMAL LOW (ref 13.0–17.1)
LYMPH#: 1.9 10*3/uL (ref 0.9–3.3)
LYMPH%: 37.8 % (ref 14.0–48.0)
MCH: 30.4 pg (ref 28.0–33.4)
MCHC: 33.2 g/dL (ref 32.0–35.9)
MCV: 91 fL (ref 82–98)
MONO#: 0.4 10*3/uL (ref 0.1–0.9)
MONO%: 8 % (ref 0.0–13.0)
NEUT#: 2.5 10*3/uL (ref 1.5–6.5)
NEUT%: 50.4 % (ref 40.0–80.0)
PLATELETS: 97 10*3/uL — AB (ref 145–400)
RBC: 3.39 10*6/uL — AB (ref 4.20–5.70)
RDW: 15.3 % (ref 11.1–15.7)
WBC: 5 10*3/uL (ref 4.0–10.0)

## 2016-06-13 MED ORDER — PROCHLORPERAZINE MALEATE 10 MG PO TABS
ORAL_TABLET | ORAL | Status: AC
  Filled 2016-06-13: qty 1

## 2016-06-13 MED ORDER — BORTEZOMIB CHEMO SQ INJECTION 3.5 MG (2.5MG/ML)
1.3000 mg/m2 | Freq: Once | INTRAMUSCULAR | Status: AC
  Administered 2016-06-13: 3 mg via SUBCUTANEOUS
  Filled 2016-06-13: qty 3

## 2016-06-13 MED ORDER — SODIUM CHLORIDE 0.9% FLUSH
10.0000 mL | INTRAVENOUS | Status: DC | PRN
  Administered 2016-06-13: 10 mL via INTRAVENOUS
  Filled 2016-06-13: qty 10

## 2016-06-13 MED ORDER — PROCHLORPERAZINE MALEATE 10 MG PO TABS
10.0000 mg | ORAL_TABLET | Freq: Four times a day (QID) | ORAL | Status: DC | PRN
  Administered 2016-06-13: 10 mg via ORAL

## 2016-06-13 MED ORDER — HEPARIN SOD (PORK) LOCK FLUSH 100 UNIT/ML IV SOLN
500.0000 [IU] | Freq: Once | INTRAVENOUS | Status: AC
  Administered 2016-06-13: 500 [IU] via INTRAVENOUS
  Filled 2016-06-13: qty 5

## 2016-06-13 NOTE — Patient Instructions (Signed)
[facility] Discharge Instructions for Patients Receiving Chemotherapy  Today you received the following chemotherapy agents VELCADE.  To help prevent nausea and vomiting after your treatment, we encourage you to take your nausea medication as directed.   If you develop nausea and vomiting that is not controlled by your nausea medication, call the clinic.   BELOW ARE SYMPTOMS THAT SHOULD BE REPORTED IMMEDIATELY:  *FEVER GREATER THAN 100.5 F  *CHILLS WITH OR WITHOUT FEVER  NAUSEA AND VOMITING THAT IS NOT CONTROLLED WITH YOUR NAUSEA MEDICATION  *UNUSUAL SHORTNESS OF BREATH  *UNUSUAL BRUISING OR BLEEDING  TENDERNESS IN MOUTH AND THROAT WITH OR WITHOUT PRESENCE OF ULCERS  *URINARY PROBLEMS  *BOWEL PROBLEMS  UNUSUAL RASH Items with * indicate a potential emergency and should be followed up as soon as possible.  Feel free to call the clinic you have any questions or concerns. The clinic phone number is [phone].  Please show the [facility] at check-in to the Emergency Department and triage nurse.

## 2016-06-13 NOTE — Progress Notes (Signed)
OK to treat with PLT of 97 today per MD Ennever and OK to use CMP from 5/31 also

## 2016-06-20 ENCOUNTER — Other Ambulatory Visit (HOSPITAL_BASED_OUTPATIENT_CLINIC_OR_DEPARTMENT_OTHER): Payer: Medicare Other

## 2016-06-20 ENCOUNTER — Ambulatory Visit (HOSPITAL_BASED_OUTPATIENT_CLINIC_OR_DEPARTMENT_OTHER): Payer: Medicare Other

## 2016-06-20 ENCOUNTER — Ambulatory Visit: Payer: Medicare Other

## 2016-06-20 ENCOUNTER — Telehealth: Payer: Self-pay | Admitting: Cardiovascular Disease

## 2016-06-20 VITALS — BP 112/49 | HR 87

## 2016-06-20 DIAGNOSIS — C8339 Diffuse large B-cell lymphoma, extranodal and solid organ sites: Secondary | ICD-10-CM | POA: Diagnosis not present

## 2016-06-20 DIAGNOSIS — E860 Dehydration: Secondary | ICD-10-CM

## 2016-06-20 DIAGNOSIS — B370 Candidal stomatitis: Secondary | ICD-10-CM

## 2016-06-20 DIAGNOSIS — Z7901 Long term (current) use of anticoagulants: Secondary | ICD-10-CM

## 2016-06-20 DIAGNOSIS — C8589 Other specified types of non-Hodgkin lymphoma, extranodal and solid organ sites: Secondary | ICD-10-CM

## 2016-06-20 DIAGNOSIS — Z5112 Encounter for antineoplastic immunotherapy: Secondary | ICD-10-CM | POA: Diagnosis not present

## 2016-06-20 DIAGNOSIS — B3781 Candidal esophagitis: Secondary | ICD-10-CM

## 2016-06-20 LAB — PROTIME-INR (CHCC SATELLITE)
INR: 2.5 (ref 2.0–3.5)
PROTIME: 30 s — AB (ref 10.6–13.4)

## 2016-06-20 LAB — CMP (CANCER CENTER ONLY)
ALT(SGPT): 19 U/L (ref 10–47)
AST: 25 U/L (ref 11–38)
Albumin: 3 g/dL — ABNORMAL LOW (ref 3.3–5.5)
Alkaline Phosphatase: 83 U/L (ref 26–84)
BUN, Bld: 11 mg/dL (ref 7–22)
CHLORIDE: 102 meq/L (ref 98–108)
CO2: 27 meq/L (ref 18–33)
CREATININE: 1 mg/dL (ref 0.6–1.2)
Calcium: 8.5 mg/dL (ref 8.0–10.3)
Glucose, Bld: 154 mg/dL — ABNORMAL HIGH (ref 73–118)
POTASSIUM: 3.5 meq/L (ref 3.3–4.7)
SODIUM: 136 meq/L (ref 128–145)
Total Bilirubin: 0.7 mg/dl (ref 0.20–1.60)
Total Protein: 6 g/dL — ABNORMAL LOW (ref 6.4–8.1)

## 2016-06-20 LAB — CBC WITH DIFFERENTIAL (CANCER CENTER ONLY)
BASO#: 0 10*3/uL (ref 0.0–0.2)
BASO%: 0.5 % (ref 0.0–2.0)
EOS%: 4.2 % (ref 0.0–7.0)
Eosinophils Absolute: 0.2 10*3/uL (ref 0.0–0.5)
HCT: 30.4 % — ABNORMAL LOW (ref 38.7–49.9)
HGB: 10.1 g/dL — ABNORMAL LOW (ref 13.0–17.1)
LYMPH#: 1.3 10*3/uL (ref 0.9–3.3)
LYMPH%: 32.6 % (ref 14.0–48.0)
MCH: 30.3 pg (ref 28.0–33.4)
MCHC: 33.2 g/dL (ref 32.0–35.9)
MCV: 91 fL (ref 82–98)
MONO#: 0.6 10*3/uL (ref 0.1–0.9)
MONO%: 14.6 % — AB (ref 0.0–13.0)
NEUT#: 2 10*3/uL (ref 1.5–6.5)
NEUT%: 48.1 % (ref 40.0–80.0)
PLATELETS: 94 10*3/uL — AB (ref 145–400)
RBC: 3.33 10*6/uL — AB (ref 4.20–5.70)
RDW: 15.6 % (ref 11.1–15.7)
WBC: 4.1 10*3/uL (ref 4.0–10.0)

## 2016-06-20 MED ORDER — BORTEZOMIB CHEMO SQ INJECTION 3.5 MG (2.5MG/ML)
1.3000 mg/m2 | Freq: Once | INTRAMUSCULAR | Status: AC
  Administered 2016-06-20: 3 mg via SUBCUTANEOUS
  Filled 2016-06-20: qty 3

## 2016-06-20 MED ORDER — PROCHLORPERAZINE MALEATE 10 MG PO TABS
10.0000 mg | ORAL_TABLET | Freq: Four times a day (QID) | ORAL | Status: DC | PRN
  Administered 2016-06-20: 10 mg via ORAL

## 2016-06-20 MED ORDER — PROCHLORPERAZINE MALEATE 10 MG PO TABS
ORAL_TABLET | ORAL | Status: AC
  Filled 2016-06-20: qty 1

## 2016-06-20 NOTE — Progress Notes (Signed)
OK to treat with platelets of 94 per Dr Marin Olp. dph

## 2016-06-20 NOTE — Telephone Encounter (Signed)
SPOKE  WITH   PT   FEELS  OKAY  [facility]  CENTER  WANTED  PT  TO CALL the patient   WILL CONTINUE  TO  MONITOR  AND  WILL CALL Monday WITH   MORE READINGS .WILL  FORWARD TO  DR Angelena Form FOR  REVIEW .Adonis Housekeeper

## 2016-06-20 NOTE — Patient Instructions (Signed)
Implanted Port Home Guide An implanted port is a type of central line that is placed under the skin. Central lines are used to provide IV access when treatment or nutrition needs to be given through a person's veins. Implanted ports are used for long-term IV access. An implanted port may be placed because:  You need IV medicine that would be irritating to the small veins in your hands or arms.  You need long-term IV medicines, such as antibiotics.  You need IV nutrition for a long period.  You need frequent blood draws for lab tests.  You need dialysis.  Implanted ports are usually placed in the chest area, but they can also be placed in the upper arm, the abdomen, or the leg. An implanted port has two main parts:  Reservoir. The reservoir is round and will appear as a small, raised area under your skin. The reservoir is the part where a needle is inserted to give medicines or draw blood.  Catheter. The catheter is a thin, flexible tube that extends from the reservoir. The catheter is placed into a large vein. Medicine that is inserted into the reservoir goes into the catheter and then into the vein.  How will I care for my incision site? Do not get the incision site wet. Bathe or shower as directed by your health care provider. How is my port accessed? Special steps must be taken to access the port:  Before the port is accessed, a numbing cream can be placed on the skin. This helps numb the skin over the port site.  Your health care provider uses a sterile technique to access the port. ? Your health care provider must put on a mask and sterile gloves. ? The skin over your port is cleaned carefully with an antiseptic and allowed to dry. ? The port is gently pinched between sterile gloves, and a needle is inserted into the port.  Only "non-coring" port needles should be used to access the port. Once the port is accessed, a blood return should be checked. This helps ensure that the port  is in the vein and is not clogged.  If your port needs to remain accessed for a constant infusion, a clear (transparent) bandage will be placed over the needle site. The bandage and needle will need to be changed every week, or as directed by your health care provider.  Keep the bandage covering the needle clean and dry. Do not get it wet. Follow your health care provider's instructions on how to take a shower or bath while the port is accessed.  If your port does not need to stay accessed, no bandage is needed over the port.  What is flushing? Flushing helps keep the port from getting clogged. Follow your health care provider's instructions on how and when to flush the port. Ports are usually flushed with saline solution or a medicine called heparin. The need for flushing will depend on how the port is used.  If the port is used for intermittent medicines or blood draws, the port will need to be flushed: ? After medicines have been given. ? After blood has been drawn. ? As part of routine maintenance.  If a constant infusion is running, the port may not need to be flushed.  How long will my port stay implanted? The port can stay in for as long as your health care provider thinks it is needed. When it is time for the port to come out, surgery will be   done to remove it. The procedure is similar to the one performed when the port was put in. When should I seek immediate medical care? When you have an implanted port, you should seek immediate medical care if:  You notice a bad smell coming from the incision site.  You have swelling, redness, or drainage at the incision site.  You have more swelling or pain at the port site or the surrounding area.  You have a fever that is not controlled with medicine.  This information is not intended to replace advice given to you by your health care provider. Make sure you discuss any questions you have with your health care provider. Document  Released: 12/24/2004 Document Revised: 06/01/2015 Document Reviewed: 08/31/2012 [employer] Interactive Patient Education  2017 [employer]  

## 2016-06-20 NOTE — Telephone Encounter (Signed)
New message    Cancer center told pt to contact you regarding the bp   Pt c/o BP issue: STAT if pt c/o blurred vision, one-sided weakness or slurred speech  1. What are your last 5 BP readings? 101/44  pulse 85 112/49 pulse 87 134/61 117/55 pulse 91  2. Are you having any other symptoms (ex. Dizziness, headache, blurred vision, passed out)? Unstable walk but no other symptoms    3. What is your BP issue? Cancer center is concerned because its low

## 2016-06-21 ENCOUNTER — Other Ambulatory Visit: Payer: Self-pay | Admitting: Family Medicine

## 2016-06-21 MED ORDER — METFORMIN HCL 500 MG PO TABS: ORAL_TABLET | ORAL | 5 refills | Status: DC

## 2016-06-23 ENCOUNTER — Other Ambulatory Visit: Payer: Self-pay | Admitting: Family Medicine

## 2016-06-23 NOTE — Telephone Encounter (Signed)
These readings seem ok to me. Can we follow up with him to see how he is feeling and what his BP is running? Thanks, chris

## 2016-06-24 ENCOUNTER — Other Ambulatory Visit: Payer: Self-pay | Admitting: General Practice

## 2016-06-24 MED ORDER — WARFARIN SODIUM 5 MG PO TABS: ORAL_TABLET | ORAL | 1 refills | Status: DC

## 2016-06-25 NOTE — Telephone Encounter (Signed)
I spoke with pt and told him Dr. Angelena Form felt blood pressure readings listed below look OK.  Pt reports BP today was 132/52 and I told him this was OK.  He is not having any chest pain or shortness of breath.  He reports he has been sleeping poorly and has no energy.  He is being treated for cancer and feels this is contributing to his lack of energy.  He is seeing primary care later this week.

## 2016-06-27 NOTE — Telephone Encounter (Signed)
Noted  

## 2016-06-28 ENCOUNTER — Ambulatory Visit (INDEPENDENT_AMBULATORY_CARE_PROVIDER_SITE_OTHER): Payer: Medicare Other | Admitting: Family Medicine

## 2016-06-28 ENCOUNTER — Encounter: Payer: Self-pay | Admitting: Family Medicine

## 2016-06-28 VITALS — BP 98/58 | HR 88 | Temp 98.1°F | Wt 238.7 lb

## 2016-06-28 DIAGNOSIS — E1140 Type 2 diabetes mellitus with diabetic neuropathy, unspecified: Secondary | ICD-10-CM | POA: Diagnosis not present

## 2016-06-28 DIAGNOSIS — R600 Localized edema: Secondary | ICD-10-CM | POA: Diagnosis not present

## 2016-06-28 DIAGNOSIS — I10 Essential (primary) hypertension: Secondary | ICD-10-CM

## 2016-06-28 DIAGNOSIS — E1165 Type 2 diabetes mellitus with hyperglycemia: Secondary | ICD-10-CM

## 2016-06-28 LAB — POCT GLYCOSYLATED HEMOGLOBIN (HGB A1C): HEMOGLOBIN A1C: 6.5

## 2016-06-28 NOTE — Progress Notes (Signed)
Subjective:     Patient ID: Gary Burgueno., male   DOB: 01-Oct-1933, 83 y.o.   MRN: [id number]  HPI Patient seen for medical follow-up. He has multiple chronic problems including history of morbid obesity, atrial fibrillation, hypertension, CAD, COPD, GERD, type 2 diabetes, peripheral neuropathy, non-Hodgkin's lymphoma, restless leg syndrome, low testosterone, hyperlipidemia, chronic insomnia.  History of with poor appetite with his chemotherapy and has been losing some weight. His blood sugars have improved with that. Last A1c 6.3%. Not monitoring regularly.  Recent issue of some bilateral leg edema. Most recent echo ejection fraction 55-60%. He was advised when seen here June 5 get compression stockings. He went to [facility] and got some nonprescription hose. Edema worse late in the day. He was advised to increase his Lasix to 40 mg twice daily via still taking once daily. Recent albumin 3.0. No orthopnea.  General fatigue. Mild dyspnea with exertion. No chest pains.  Past Medical History:  Diagnosis Date  . Anxiety   . Atrial fibrillation ([place])   . COPD (chronic obstructive pulmonary disease) ([place])    pt. denies  . Coronary artery disease    a. h/o Overlapping stents RCA;  b. 06/2011 Cath: patent stents, nonobs dzs, NL EF.  . Diabetic peripheral neuropathy ([place])   . Diffuse non-Hodgkin's lymphoma of testis ([place]) 09/28/2015  . DM (diabetes mellitus) ([place])    Type 2, peripheral neuropathy.  . Dyspnea    with exertion  . Dysrhythmia   . GERD (gastroesophageal reflux disease)   . Headache   . History of bronchitis   . History of kidney stones   . Hyperlipidemia   . Hypertension   . Low testosterone   . Nephrolithiasis   . Osteoarthritis    shoulder  . Restless leg   . SVT (supraventricular tachycardia) ([place])   . Urinary frequency   . Wears partial dentures    upper and lower   Past Surgical History:  Procedure Laterality Date  . APPENDECTOMY    . [place]   . CARDIAC CATHETERIZATION  01/2013  . CATARACT EXTRACTION, BILATERAL    . CHOLECYSTECTOMY    . COLONOSCOPY    . CORONARY ANGIOPLASTY  2004  . EYE SURGERY Bilateral    cataracts  . IR GENERIC HISTORICAL  10/05/2015   IR US GUIDE VASC ACCESS RIGHT 10/05/2015 Marybelle Killings, MD [facility]-INTERV RAD  . IR GENERIC HISTORICAL  10/05/2015   IR FLUORO GUIDE PORT INSERTION RIGHT 10/05/2015 Marybelle Killings, MD [facility]  . LEFT HEART CATHETERIZATION WITH CORONARY ANGIOGRAM N/A 06/18/2011   Procedure: LEFT HEART CATHETERIZATION WITH CORONARY ANGIOGRAM;  Surgeon: Peter M Martinique, MD;  Location: [facility] CATH LAB;  Service: Cardiovascular;  Laterality: N/A;  . LEFT HEART CATHETERIZATION WITH CORONARY ANGIOGRAM N/A 01/27/2013   Procedure: LEFT HEART CATHETERIZATION WITH CORONARY ANGIOGRAM;  Surgeon: Burnell Blanks, MD;  Location: [facility] CATH LAB;  Service: Cardiovascular;  Laterality: N/A;  . LUMBAR LAMINECTOMY/DECOMPRESSION MICRODISCECTOMY N/A 02/07/2015   Procedure: Lumbar three-Sacral one Decompression;  Surgeon: Kevan Ny Ditty, MD;  Location: [place] NEURO ORS;  Service: Neurosurgery;  Laterality: N/A;  L3 to S1 Decompression  . MULTIPLE TOOTH EXTRACTIONS    . ORCHIECTOMY Right 09/01/2015   Procedure: RIGHT ORCHIECTOMY;  Surgeon: Kathie Rhodes, MD;  Location: [facility] ORS;  Service: Urology;  Laterality: Right;  . ROTATOR CUFF REPAIR Left     reports that he quit smoking about 38 years ago. His smoking use included Cigarettes. He has a  30.00 pack-year smoking history. He has never used smokeless tobacco. He reports that he does not drink alcohol or use drugs. family history includes Alzheimer's disease in his mother; Arthritis in his brother and sister; Heart disease in his brother, father, mother, and sister; Migraines in his daughter and father; Obesity in his sister and son; Prostate cancer in his brother; Sleep apnea in his son; Thyroid disease in his daughter; Ulcers in his father. Allergies  Allergen Reactions  . Ace  Inhibitors Other (See Comments)    cough  . Codeine Nausea Only and Rash       . Penicillins Rash    Childhood allergy Has patient had a PCN reaction causing immediate rash, facial/tongue/throat swelling, SOB or lightheadedness with hypotension: Yes Has patient had a PCN reaction causing severe rash involving mucus membranes or skin necrosis: Yes Has patient had a PCN reaction that required hospitalization No Has patient had a PCN reaction occurring within the last 10 years: No If all of the above answers are "NO", then may proceed with Cephalosporin use.      Review of Systems  Constitutional: Positive for appetite change and fatigue. Negative for fever.  Eyes: Negative for visual disturbance.  Respiratory: Negative for cough, chest tightness and shortness of breath.   Cardiovascular: Positive for leg swelling. Negative for chest pain and palpitations.  Gastrointestinal: Negative for abdominal pain.  Endocrine: Negative for polydipsia and polyuria.  Neurological: Negative for dizziness, syncope, weakness, light-headedness and headaches.       Objective:   Physical Exam  Constitutional: He is oriented to person, place, and time. He appears well-developed and well-nourished.  HENT:  Right Ear: External ear normal.  Left Ear: External ear normal.  Mouth/Throat: Oropharynx is clear and moist.  Eyes: Pupils are equal, round, and reactive to light.  Neck: Neck supple. No thyromegaly present.  Cardiovascular: Normal rate and regular rhythm.   Pulmonary/Chest: Effort normal and breath sounds normal. No respiratory distress. He has no wheezes. He has no rales.  Musculoskeletal:  He has some mild nonpitting edema legs bilaterally  Neurological: He is alert and oriented to person, place, and time.       Assessment:     #1 type 2 diabetes well controlled with A1c 6.5%  #2 hypertension stable and at goal  #3 bilateral leg edema. Suspect multifactorial related to venous stasis  and also recent low albumin of 3.0.  May also be related to gabapentin    Plan:     -We provided prescription for knee-high compression stockings 20-30 mm pressure  -Elevate legs frequently -Suggested nutritional supplement such as glucerna.  Avoid Ensure with his diabetes history -Routine follow up in 3 months. He is strong encouraged to bring all his prescriptions with him at that time for review  [name] MD [facility] at [facility]

## 2016-06-28 NOTE — Patient Instructions (Signed)
Start compression hose, as discussed Consider nutrition supplement such as Glucerna.

## 2016-06-30 ENCOUNTER — Telehealth: Payer: Self-pay | Admitting: Family Medicine

## 2016-06-30 ENCOUNTER — Encounter (HOSPITAL_COMMUNITY): Payer: Self-pay | Admitting: Emergency Medicine

## 2016-06-30 ENCOUNTER — Inpatient Hospital Stay (HOSPITAL_COMMUNITY)
Admission: EM | Admit: 2016-06-30 | Discharge: 2016-07-03 | DRG: 291 | Disposition: A | Discharge status: Home / Self Care | Payer: Medicare Other | Attending: Family Medicine | Admitting: Family Medicine

## 2016-06-30 DIAGNOSIS — C8589 Other specified types of non-Hodgkin lymphoma, extranodal and solid organ sites: Secondary | ICD-10-CM | POA: Diagnosis present

## 2016-06-30 DIAGNOSIS — Z880 Allergy status to penicillin: Secondary | ICD-10-CM

## 2016-06-30 DIAGNOSIS — G629 Polyneuropathy, unspecified: Secondary | ICD-10-CM

## 2016-06-30 DIAGNOSIS — K219 Gastro-esophageal reflux disease without esophagitis: Secondary | ICD-10-CM | POA: Diagnosis not present

## 2016-06-30 DIAGNOSIS — Z7901 Long term (current) use of anticoagulants: Secondary | ICD-10-CM

## 2016-06-30 DIAGNOSIS — I5031 Acute diastolic (congestive) heart failure: Secondary | ICD-10-CM | POA: Diagnosis not present

## 2016-06-30 DIAGNOSIS — E876 Hypokalemia: Secondary | ICD-10-CM | POA: Diagnosis not present

## 2016-06-30 DIAGNOSIS — I110 Hypertensive heart disease with heart failure: Principal | ICD-10-CM | POA: Diagnosis present

## 2016-06-30 DIAGNOSIS — M7989 Other specified soft tissue disorders: Secondary | ICD-10-CM

## 2016-06-30 DIAGNOSIS — Z79899 Other long term (current) drug therapy: Secondary | ICD-10-CM | POA: Diagnosis not present

## 2016-06-30 DIAGNOSIS — E43 Unspecified severe protein-calorie malnutrition: Secondary | ICD-10-CM | POA: Diagnosis not present

## 2016-06-30 DIAGNOSIS — C8590 Non-Hodgkin lymphoma, unspecified, unspecified site: Secondary | ICD-10-CM | POA: Diagnosis present

## 2016-06-30 DIAGNOSIS — Z6833 Body mass index (BMI) 33.0-33.9, adult: Secondary | ICD-10-CM

## 2016-06-30 DIAGNOSIS — I472 Ventricular tachycardia, unspecified: Secondary | ICD-10-CM

## 2016-06-30 DIAGNOSIS — E1142 Type 2 diabetes mellitus with diabetic polyneuropathy: Secondary | ICD-10-CM | POA: Diagnosis not present

## 2016-06-30 DIAGNOSIS — E785 Hyperlipidemia, unspecified: Secondary | ICD-10-CM | POA: Diagnosis present

## 2016-06-30 DIAGNOSIS — G2581 Restless legs syndrome: Secondary | ICD-10-CM | POA: Diagnosis present

## 2016-06-30 DIAGNOSIS — I2510 Atherosclerotic heart disease of native coronary artery without angina pectoris: Secondary | ICD-10-CM | POA: Diagnosis present

## 2016-06-30 DIAGNOSIS — I480 Paroxysmal atrial fibrillation: Secondary | ICD-10-CM | POA: Diagnosis present

## 2016-06-30 DIAGNOSIS — J449 Chronic obstructive pulmonary disease, unspecified: Secondary | ICD-10-CM | POA: Diagnosis not present

## 2016-06-30 DIAGNOSIS — Z794 Long term (current) use of insulin: Secondary | ICD-10-CM

## 2016-06-30 DIAGNOSIS — Z87891 Personal history of nicotine dependence: Secondary | ICD-10-CM | POA: Diagnosis not present

## 2016-06-30 DIAGNOSIS — R600 Localized edema: Secondary | ICD-10-CM | POA: Diagnosis present

## 2016-06-30 DIAGNOSIS — R109 Unspecified abdominal pain: Secondary | ICD-10-CM

## 2016-06-30 DIAGNOSIS — R531 Weakness: Secondary | ICD-10-CM | POA: Diagnosis present

## 2016-06-30 DIAGNOSIS — F419 Anxiety disorder, unspecified: Secondary | ICD-10-CM | POA: Diagnosis not present

## 2016-06-30 DIAGNOSIS — M79605 Pain in left leg: Secondary | ICD-10-CM

## 2016-06-30 NOTE — ED Triage Notes (Signed)
Pt presents to ED for assessment of lower limb swelling, pain, intermittent dizziness, and three mechanical falls.  Denies LOC, denies head injury, denies specific fall related pain.  Pt states he is wearing [employer] for the swelling, which make it difficult for him to walk.  Pt denies CP, denies SOB, denies AMS

## 2016-06-30 NOTE — ED Notes (Signed)
Lobby announcement made, wait time explained, and updates offered.

## 2016-06-30 NOTE — Telephone Encounter (Signed)
Received call from team health. They state the patient has had increased swelling over the last several days and then has had tingling in his legs that is new from prior. Reports he is not able to walk very well and he was having difficulty moving his leg yesterday. They called just to inform me that they have sent the patient to the emergency room. FYI to PCP.

## 2016-07-01 ENCOUNTER — Other Ambulatory Visit: Payer: Self-pay | Admitting: Hematology & Oncology

## 2016-07-01 ENCOUNTER — Observation Stay (HOSPITAL_COMMUNITY): Payer: Medicare Other

## 2016-07-01 ENCOUNTER — Observation Stay (HOSPITAL_BASED_OUTPATIENT_CLINIC_OR_DEPARTMENT_OTHER): Payer: Medicare Other

## 2016-07-01 DIAGNOSIS — R109 Unspecified abdominal pain: Secondary | ICD-10-CM

## 2016-07-01 DIAGNOSIS — M7989 Other specified soft tissue disorders: Secondary | ICD-10-CM | POA: Diagnosis not present

## 2016-07-01 DIAGNOSIS — W19XXXA Unspecified fall, initial encounter: Secondary | ICD-10-CM

## 2016-07-01 DIAGNOSIS — E785 Hyperlipidemia, unspecified: Secondary | ICD-10-CM | POA: Diagnosis not present

## 2016-07-01 DIAGNOSIS — I2510 Atherosclerotic heart disease of native coronary artery without angina pectoris: Secondary | ICD-10-CM | POA: Diagnosis not present

## 2016-07-01 DIAGNOSIS — I480 Paroxysmal atrial fibrillation: Secondary | ICD-10-CM | POA: Diagnosis not present

## 2016-07-01 DIAGNOSIS — K219 Gastro-esophageal reflux disease without esophagitis: Secondary | ICD-10-CM | POA: Diagnosis not present

## 2016-07-01 DIAGNOSIS — M79605 Pain in left leg: Secondary | ICD-10-CM

## 2016-07-01 DIAGNOSIS — C8589 Other specified types of non-Hodgkin lymphoma, extranodal and solid organ sites: Secondary | ICD-10-CM

## 2016-07-01 DIAGNOSIS — Z7901 Long term (current) use of anticoagulants: Secondary | ICD-10-CM

## 2016-07-01 DIAGNOSIS — R600 Localized edema: Secondary | ICD-10-CM | POA: Diagnosis present

## 2016-07-01 LAB — BASIC METABOLIC PANEL
Anion gap: 11 (ref 5–15)
BUN: 11 mg/dL (ref 6–20)
CALCIUM: 8.1 mg/dL — AB (ref 8.9–10.3)
CO2: 24 mmol/L (ref 22–32)
CREATININE: 1.16 mg/dL (ref 0.61–1.24)
Chloride: 102 mmol/L (ref 101–111)
GFR calc non Af Amer: 57 mL/min — ABNORMAL LOW (ref >60)
Glucose, Bld: 111 mg/dL — ABNORMAL HIGH (ref 65–99)
Potassium: 3.5 mmol/L (ref 3.5–5.1)
SODIUM: 137 mmol/L (ref 135–145)

## 2016-07-01 LAB — CBC WITH DIFFERENTIAL/PLATELET
BASOS PCT: 1 %
Basophils Absolute: 0 10*3/uL (ref 0.0–0.1)
EOS PCT: 2 %
Eosinophils Absolute: 0.1 10*3/uL (ref 0.0–0.7)
HCT: 30.6 % — ABNORMAL LOW (ref 39.0–52.0)
Hemoglobin: 9.9 g/dL — ABNORMAL LOW (ref 13.0–17.0)
Lymphocytes Relative: 46 %
Lymphs Abs: 2.2 10*3/uL (ref 0.7–4.0)
MCH: 28.7 pg (ref 26.0–34.0)
MCHC: 32.4 g/dL (ref 30.0–36.0)
MCV: 88.7 fL (ref 78.0–100.0)
MONO ABS: 0.8 10*3/uL (ref 0.1–1.0)
MONOS PCT: 16 %
Neutro Abs: 1.7 10*3/uL (ref 1.7–7.7)
Neutrophils Relative %: 35 %
PLATELETS: 127 10*3/uL — AB (ref 150–400)
RBC: 3.45 MIL/uL — ABNORMAL LOW (ref 4.22–5.81)
RDW: 15.7 % — AB (ref 11.5–15.5)
WBC: 4.9 10*3/uL (ref 4.0–10.5)

## 2016-07-01 LAB — PROTIME-INR
INR: 2.83
Prothrombin Time: 30.3 seconds — ABNORMAL HIGH (ref 11.4–15.2)

## 2016-07-01 LAB — GLUCOSE, CAPILLARY
GLUCOSE-CAPILLARY: 131 mg/dL — AB (ref 65–99)
GLUCOSE-CAPILLARY: 106 mg/dL — AB (ref 65–99)
GLUCOSE-CAPILLARY: 135 mg/dL — AB (ref 65–99)
Glucose-Capillary: 127 mg/dL — ABNORMAL HIGH (ref 65–99)

## 2016-07-01 MED ORDER — PRAMIPEXOLE DIHYDROCHLORIDE 1 MG PO TABS
1.0000 mg | ORAL_TABLET | Freq: Every day | ORAL | Status: DC
Start: 2016-07-01 — End: 2016-07-01
  Filled 2016-07-01: qty 2

## 2016-07-01 MED ORDER — CLOPIDOGREL BISULFATE 75 MG PO TABS
75.0000 mg | ORAL_TABLET | Freq: Every day | ORAL | Status: DC
  Administered 2016-07-01 – 2016-07-03 (×3): 75 mg via ORAL
  Filled 2016-07-01 (×3): qty 1

## 2016-07-01 MED ORDER — TEMAZEPAM 15 MG PO CAPS: 30.0000 mg | ORAL_CAPSULE | Freq: Every evening | ORAL | Status: DC | PRN

## 2016-07-01 MED ORDER — SODIUM CHLORIDE 0.9% FLUSH
10.0000 mL | INTRAVENOUS | Status: DC | PRN
  Administered 2016-07-02: 20 mL
  Filled 2016-07-01: qty 40

## 2016-07-01 MED ORDER — ONDANSETRON HCL 4 MG/2ML IJ SOLN: 4.0000 mg | Freq: Four times a day (QID) | INTRAMUSCULAR | Status: DC | PRN

## 2016-07-01 MED ORDER — ONDANSETRON HCL 4 MG PO TABS: 4.0000 mg | ORAL_TABLET | Freq: Four times a day (QID) | ORAL | Status: DC | PRN

## 2016-07-01 MED ORDER — GABAPENTIN 300 MG PO CAPS
600.0000 mg | ORAL_CAPSULE | Freq: Three times a day (TID) | ORAL | Status: DC
  Administered 2016-07-01 – 2016-07-03 (×7): 600 mg via ORAL
  Filled 2016-07-01 (×7): qty 2

## 2016-07-01 MED ORDER — ATORVASTATIN CALCIUM 40 MG PO TABS
40.0000 mg | ORAL_TABLET | Freq: Every day | ORAL | Status: DC
  Administered 2016-07-01 – 2016-07-03 (×3): 40 mg via ORAL
  Filled 2016-07-01 (×3): qty 1

## 2016-07-01 MED ORDER — TROSPIUM CHLORIDE ER 60 MG PO CP24
60.0000 mg | ORAL_CAPSULE | Freq: Every day | ORAL | Status: DC
  Administered 2016-07-02 – 2016-07-03 (×2): 60 mg via ORAL
  Filled 2016-07-01 (×3): qty 1

## 2016-07-01 MED ORDER — METOPROLOL SUCCINATE ER 100 MG PO TB24
100.0000 mg | ORAL_TABLET | Freq: Every day | ORAL | Status: DC
  Administered 2016-07-01 – 2016-07-03 (×3): 100 mg via ORAL
  Filled 2016-07-01 (×3): qty 1

## 2016-07-01 MED ORDER — IOPAMIDOL (ISOVUE-300) INJECTION 61%
INTRAVENOUS | Status: AC
Start: 2016-07-01 — End: 2016-07-01
  Administered 2016-07-01: 100 mL via INTRAVENOUS
  Filled 2016-07-01: qty 100

## 2016-07-01 MED ORDER — ENOXAPARIN SODIUM 120 MG/0.8ML ~~LOC~~ SOLN: 1.0000 mg/kg | Freq: Once | SUBCUTANEOUS | Status: DC

## 2016-07-01 MED ORDER — ACETAMINOPHEN 650 MG RE SUPP
650.0000 mg | Freq: Four times a day (QID) | RECTAL | Status: DC | PRN
Start: 2016-07-01 — End: 2016-07-03

## 2016-07-01 MED ORDER — ENSURE ENLIVE PO LIQD
237.0000 mL | Freq: Two times a day (BID) | ORAL | Status: DC
  Administered 2016-07-02: 237 mL via ORAL

## 2016-07-01 MED ORDER — PRAMIPEXOLE DIHYDROCHLORIDE 1 MG PO TABS
1.0000 mg | ORAL_TABLET | Freq: Every day | ORAL | Status: DC
  Administered 2016-07-02 – 2016-07-03 (×2): 1 mg via ORAL
  Filled 2016-07-01 (×6): qty 1

## 2016-07-01 MED ORDER — INSULIN GLARGINE 100 UNIT/ML ~~LOC~~ SOLN
20.0000 [IU] | Freq: Every day | SUBCUTANEOUS | Status: DC
  Administered 2016-07-01 – 2016-07-03 (×3): 20 [IU] via SUBCUTANEOUS
  Filled 2016-07-01 (×3): qty 0.2

## 2016-07-01 MED ORDER — ACETAMINOPHEN 325 MG PO TABS
650.0000 mg | ORAL_TABLET | Freq: Four times a day (QID) | ORAL | Status: DC | PRN
  Administered 2016-07-01: 650 mg via ORAL
  Filled 2016-07-01: qty 2

## 2016-07-01 MED ORDER — PANTOPRAZOLE SODIUM 40 MG PO TBEC
40.0000 mg | DELAYED_RELEASE_TABLET | Freq: Every day | ORAL | Status: DC
  Administered 2016-07-01 – 2016-07-03 (×3): 40 mg via ORAL
  Filled 2016-07-01 (×3): qty 1

## 2016-07-01 MED ORDER — WARFARIN SODIUM 7.5 MG PO TABS
7.5000 mg | ORAL_TABLET | ORAL | Status: DC
  Administered 2016-07-02: 7.5 mg via ORAL
  Filled 2016-07-01: qty 1

## 2016-07-01 MED ORDER — OXYMETAZOLINE HCL 0.05 % NA SOLN
1.0000 | Freq: Two times a day (BID) | NASAL | Status: DC | PRN
  Filled 2016-07-01: qty 15

## 2016-07-01 MED ORDER — HYDROCODONE-ACETAMINOPHEN 7.5-325 MG PO TABS: 1.0000 | ORAL_TABLET | ORAL | Status: DC | PRN

## 2016-07-01 MED ORDER — WARFARIN - PHARMACIST DOSING INPATIENT: Freq: Every day | Status: DC

## 2016-07-01 MED ORDER — SODIUM CHLORIDE 0.9% FLUSH
3.0000 mL | Freq: Two times a day (BID) | INTRAVENOUS | Status: DC
  Administered 2016-07-01 – 2016-07-03 (×3): 3 mL via INTRAVENOUS

## 2016-07-01 MED ORDER — INSULIN ASPART 100 UNIT/ML ~~LOC~~ SOLN
0.0000 [IU] | Freq: Three times a day (TID) | SUBCUTANEOUS | Status: DC
  Administered 2016-07-01 – 2016-07-03 (×5): 1 [IU] via SUBCUTANEOUS
  Administered 2016-07-03: 2 [IU] via SUBCUTANEOUS

## 2016-07-01 MED ORDER — FUROSEMIDE 10 MG/ML IJ SOLN
80.0000 mg | Freq: Once | INTRAMUSCULAR | Status: AC
  Administered 2016-07-01: 80 mg via INTRAVENOUS
  Filled 2016-07-01: qty 8

## 2016-07-01 MED ORDER — DOCUSATE SODIUM 100 MG PO CAPS: 100.0000 mg | ORAL_CAPSULE | Freq: Two times a day (BID) | ORAL | Status: DC | PRN

## 2016-07-01 MED ORDER — NITROGLYCERIN 0.4 MG SL SUBL: 0.4000 mg | SUBLINGUAL_TABLET | SUBLINGUAL | Status: DC | PRN

## 2016-07-01 MED ORDER — TAMSULOSIN HCL 0.4 MG PO CAPS
0.4000 mg | ORAL_CAPSULE | Freq: Every day | ORAL | Status: DC
  Administered 2016-07-01 – 2016-07-03 (×2): 0.4 mg via ORAL
  Filled 2016-07-01 (×2): qty 1

## 2016-07-01 MED ORDER — WARFARIN SODIUM 10 MG PO TABS
10.0000 mg | ORAL_TABLET | ORAL | Status: DC
  Administered 2016-07-01: 10 mg via ORAL
  Filled 2016-07-01: qty 1

## 2016-07-01 MED ORDER — ALBUTEROL SULFATE (2.5 MG/3ML) 0.083% IN NEBU: 2.5000 mg | INHALATION_SOLUTION | Freq: Four times a day (QID) | RESPIRATORY_TRACT | Status: DC | PRN

## 2016-07-01 MED ORDER — IOPAMIDOL (ISOVUE-300) INJECTION 61%
INTRAVENOUS | Status: AC
  Filled 2016-07-01: qty 30

## 2016-07-01 MED ORDER — FUROSEMIDE 10 MG/ML IJ SOLN
40.0000 mg | Freq: Two times a day (BID) | INTRAMUSCULAR | Status: DC
  Administered 2016-07-01 – 2016-07-02 (×2): 40 mg via INTRAVENOUS
  Filled 2016-07-01 (×2): qty 4

## 2016-07-01 MED ORDER — OXYBUTYNIN CHLORIDE ER 15 MG PO TB24
15.0000 mg | ORAL_TABLET | Freq: Every day | ORAL | Status: DC
  Administered 2016-07-01 – 2016-07-03 (×2): 15 mg via ORAL
  Filled 2016-07-01 (×3): qty 1

## 2016-07-01 NOTE — Progress Notes (Signed)
MD returned page and spoke to family via phone. Informed them that pt PET scan will be postponed and pt oncologist will be by to assess.   Gary Yates

## 2016-07-01 NOTE — Progress Notes (Signed)
ANTICOAGULATION CONSULT NOTE - Initial Consult  Pharmacy Consult for Coumadin Indication: atrial fibrillation  Allergies  Allergen Reactions  . Ace Inhibitors Other (See Comments)    cough  . Codeine Nausea Only and Rash       . Penicillins Rash    Childhood allergy Has patient had a PCN reaction causing immediate rash, facial/tongue/throat swelling, SOB or lightheadedness with hypotension: Yes Has patient had a PCN reaction causing severe rash involving mucus membranes or skin necrosis: Yes Has patient had a PCN reaction that required hospitalization No Has patient had a PCN reaction occurring within the last 10 years: No If all of the above answers are "NO", then may proceed with Cephalosporin use.     Patient Measurements:    Vital Signs: Temp: 99.5 F (37.5 C) (06/24 2105) Temp Source: Oral (06/24 2105) BP: 133/67 (06/25 0530) Pulse Rate: 84 (06/25 0530)  Labs:  Recent Labs  07/01/16 0030 07/01/16 0038  HGB 9.9*  --   HCT 30.6*  --   PLT 127*  --   LABPROT  --  30.3*  INR  --  2.83  CREATININE 1.16  --     Estimated Creatinine Clearance: 60.8 mL/min (by C-G formula based on SCr of 1.16 mg/dL).   Medical History: Past Medical History:  Diagnosis Date  . Anxiety   . Atrial fibrillation ([place])   . COPD (chronic obstructive pulmonary disease) ([place])    pt. denies  . Coronary artery disease    a. h/o Overlapping stents RCA;  b. 06/2011 Cath: patent stents, nonobs dzs, NL EF.  . Diabetic peripheral neuropathy ([place])   . Diffuse non-Hodgkin's lymphoma of testis ([place]) 09/28/2015  . DM (diabetes mellitus) ([place])    Type 2, peripheral neuropathy.  . Dyspnea    with exertion  . Dysrhythmia   . GERD (gastroesophageal reflux disease)   . Headache   . History of bronchitis   . History of kidney stones   . Hyperlipidemia   . Hypertension   . Low testosterone   . Nephrolithiasis   . Osteoarthritis    shoulder  . Restless leg   . SVT (supraventricular  tachycardia) ([place])   . Urinary frequency   . Wears partial dentures    upper and lower    Medications:  Current Facility-Administered Medications on File Prior to Encounter  Medication Dose Route Frequency Provider Last Rate Last Dose  . testosterone cypionate (DEPOTESTOTERONE CYPIONATE) injection 200 mg  200 mg Intramuscular Q28 days [name], MD   200 mg at 12/26/11 0850  . testosterone cypionate (DEPOTESTOTERONE CYPIONATE) injection 200 mg  200 mg Intramuscular Q28 days [name], MD   200 mg at 01/23/12 0908  . testosterone cypionate (DEPOTESTOTERONE CYPIONATE) injection 200 mg  200 mg Intramuscular Q28 days [name], MD   200 mg at 02/25/12 0848  . testosterone cypionate (DEPOTESTOTERONE CYPIONATE) injection 200 mg  200 mg Intramuscular Q28 days [name], MD   200 mg at 06/03/12 1437   Current Outpatient Prescriptions on File Prior to Encounter  Medication Sig Dispense Refill  . ACCU-CHEK AVIVA PLUS test strip TEST 3 TIMES A DAY (Patient taking differently: TEST 2 TIMES A DAY) 300 each 5  . acetaminophen (TYLENOL) 500 MG tablet Take 1,000 mg by mouth every 6 (six) hours as needed (for pain/fever/headaches.).     Marland Kitchen atorvastatin (LIPITOR) 40 MG tablet TAKE 1 TABLET BY MOUTH DAILY 90 tablet 2  . clopidogrel (PLAVIX) 75 MG  tablet TAKE 1 TABLET BY MOUTH DAILY 30 tablet 8  . docusate sodium (COLACE) 50 MG capsule Take 1 capsule (50 mg total) by mouth 2 (two) times daily as needed (for constipation). 90 capsule 0  . furosemide (LASIX) 40 MG tablet TAKE 1 TABLET BY MOUTH EVERY DAY 30 tablet 1  . gabapentin (NEURONTIN) 300 MG capsule Take 2 capsules (600 mg total) by mouth 3 (three) times daily. 540 capsule 2  . insulin lispro (HUMALOG) 100 UNIT/ML injection Inject 4 Units into the skin daily with breakfast.     . metFORMIN (GLUCOPHAGE) 500 MG tablet TAKE 1 TABLET BY MOUTH TWICE A DAY WITH A MEAL 60 tablet 5  . metoprolol succinate (TOPROL-XL) 100 MG 24 hr  tablet TAKE 1 TABLET EVERY DAY IMMEDIATELY FOLLOWING A MEAL 90 tablet 1  . Multiple Vitamins-Minerals (CENTRUM SILVER 50+MEN) TABS Take 1 tablet by mouth daily.    . nitroGLYCERIN (NITROSTAT) 0.4 MG SL tablet Place 1 tablet (0.4 mg total) under the tongue every 5 (five) minutes as needed. Chest pain 25 tablet 6  . ondansetron (ZOFRAN) 4 MG tablet Take 1 tablet (4 mg total) by mouth every 8 (eight) hours as needed for nausea or vomiting. 30 tablet 0  . oxybutynin (DITROPAN XL) 15 MG 24 hr tablet Take 15 mg by mouth at bedtime.     . pantoprazole (PROTONIX) 40 MG tablet TAKE 1 TABLET BY MOUTH EVERY DAY 90 tablet 3  . pramipexole (MIRAPEX) 1.5 MG tablet MAY TAKE 1 TO 1 AND 1/2 TABLETS BY MOUTH AT BEDTIME 135 tablet 2  . tamsulosin (FLOMAX) 0.4 MG CAPS capsule Take 0.4 mg by mouth at bedtime.    . temazepam (RESTORIL) 30 MG capsule Take 1 capsule (30 mg total) by mouth at bedtime as needed for sleep. 30 capsule 0  . TOUJEO SOLOSTAR 300 UNIT/ML SOPN INJECT 20 UNITS INTO THE SKIN DAILY AFTER BREAKFAST. 15 mL 0  . Trospium Chloride 60 MG CP24 Take 60 mg by mouth daily.    . vitamin B-12 (CYANOCOBALAMIN) 1000 MCG tablet Take 1,000 mcg by mouth daily.    Marland Kitchen warfarin (COUMADIN) 5 MG tablet Take as directed by anticoagulation clinic (Patient taking differently: Take 7.5-10 mg by mouth every evening. Monday and Friday take 10 mg. All other days take 7.5 mg) 60 tablet 1  . [DISCONTINUED] metoprolol (LOPRESSOR) 50 MG tablet     . [DISCONTINUED] spironolactone (ALDACTONE) 25 MG tablet       Assessment: 83 y.o. male admitted with LE swelling, h/o Afib, to continue Coumadin  Goal of Therapy:  INR 2-3 Heparin level 0.3-0.7 units/ml Monitor platelets by anticoagulation protocol: Yes   Plan:  Continue home Coumadin regimen Daily INR  Gary Yates, Bronson Curb 07/01/2016,5:52 AM

## 2016-07-01 NOTE — Progress Notes (Signed)
Called CT to inform pt completed contrast drink  CT stated they got behind and will send for transport shortly  Pt expressed concern for PET scan schedule at 0930 at [facility]. Pt states he informed MD and MD informed him that he'll be discharged early in the morning.   Gary Yates

## 2016-07-01 NOTE — Progress Notes (Signed)
**  Preliminary report by tech**  Bilateral lower extremity venous duplex completed. There is no evidence of deep or superficial vein thrombosis involving the right and left lower extremities. All visualized vessels appear patent and compressible. There is no evidence of Baker's cysts bilaterally.  07/01/16 9:16 AM Carlos Levering RVT

## 2016-07-01 NOTE — Progress Notes (Signed)
Pt states he has no allergies to contrast or shellfish  Pt agreeable to drink contrast

## 2016-07-01 NOTE — Progress Notes (Addendum)
[phone] [name], pt son would like update on discharge status once MD informs pt. Paged MD, awaiting call back  Cherree Conerly Leory Plowman

## 2016-07-01 NOTE — ED Notes (Signed)
Writer attempted to walk the patient with a two person stand by assist, patient was unsteady upon standing and was unable to walk.

## 2016-07-01 NOTE — Progress Notes (Signed)
[facility]   Patient admitted after midnight see H&P for full details.   Jarmon Javid is a 83 year old male with medical history of diffuse LYMPHOMA with metastasis to adrenal gland on chemotherapy, diastolic dysfunction, diabetes type 2, COPD and PAF on chronic anticoagulation report that he has progressive worsening lower extremity edema and recent falls. Patient was admitted due to severe lower extremity edema and was started on diuresis.  Patient seen and examined doing much better, but legs still swollen.  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 We'll place TED hose. CT of the abdomen pending Dr. Marin Olp notified - patient scheduled for PET scan tomorrow in the morning, oncology team will rearrange for rescheduling. Continue SSI and monitor CBGs Continue warfarin  Rest as per history of present illness.  Chipper Oman, MD

## 2016-07-01 NOTE — Progress Notes (Signed)
Ordered TED hose, awaiting delivery

## 2016-07-01 NOTE — ED Provider Notes (Signed)
[place] [facility] DEPT Provider Note   CSN: [id number] Arrival date & time: 06/30/16  2046  By signing my name below, I, Marcello Moores, attest that this documentation has been prepared under the direction and in the presence of Mizuki Hoel, Barbette Hair, MD. Electronically Signed: Marcello Moores, ED Scribe. 07/01/16. 3:11 AM.  History   Chief Complaint Chief Complaint  Patient presents with  . Leg Swelling  . Fall   The history is provided by the patient. No language interpreter was used.   HPI Comments: Gary Yates. is a 83 y.o. male, with a pt of CA, presents to the Emergency Department complaining of moderate bilateral leg and feet swelling with associated tingling. The pt reports that this makes it difficult to ambulate (baseline is with a walker) and causes his 3 mechanical falls that occurred yesterday. The pt denies head injury. Patient reports that he was fitted for TED hose this week. He feels that they're making the problem worse and making the swelling go into his knees. He denies any chest pain or shortness of breath. Denies any history of heart failure. He is currently on Lasix twice daily. Was just increased to twice daily on Friday. Pt also notes a decrease in appetite since the beginning of his CA treatment.  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 The pt has a h/x of cellulitis, DM, neuropathy and has had placement of a cardiac stent in 2004.   Past Medical History:  Diagnosis Date  . Anxiety   . Atrial fibrillation ([place])   . COPD (chronic obstructive pulmonary disease) ([place])    pt. denies  . Coronary artery disease    a. h/o Overlapping stents RCA;  b. 06/2011 Cath: patent stents, nonobs dzs, NL EF.  . Diabetic peripheral neuropathy ([place])   . 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 DM (diabetes mellitus) ([place])    Type 2, peripheral neuropathy.  . Dyspnea    with  exertion  . Dysrhythmia   . GERD (gastroesophageal reflux disease)   . Headache   . History of bronchitis   . History of kidney stones   . Hyperlipidemia   . Hypertension   . Low testosterone   . Nephrolithiasis   . Osteoarthritis    shoulder  . Restless leg   . SVT (supraventricular tachycardia) ([place])   . Urinary frequency   . Wears partial dentures    upper and lower    Patient Active Problem List   Diagnosis Date Noted  . Bilateral leg edema 06/11/2016  . 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 Lumbosacral spondylosis with radiculopathy 02/07/2015  . At high risk for falls 05/05/2014  . Encounter for therapeutic drug monitoring 03/15/2013  . Chest pain 02/03/2013  . Obesity (BMI 30-39.9) 09/21/2012  . Poorly controlled type 2 diabetes mellitus with neuropathy ([place]) 07/03/2012  . Chronic kidney disease, stage III (moderate) 07/03/2012  . Restless leg syndrome 12/04/2011  . Long term current use of anticoagulant therapy 07/15/2011  . Paroxysmal atrial fibrillation ([place]) 06/21/2011  . Upper extremity weakness 06/21/2011  . Midsternal chest pain 06/19/2011  . Coronary artery disease   . Hypertension   . Hyperlipidemia   . COPD (chronic obstructive pulmonary disease) ([place])   . GERD (gastroesophageal reflux disease)   . Peripheral neuropathy ([place])   . Dyslipidemia 03/27/2011  . Diabetic peripheral neuropathy ([place]) 12/17/2010  . Hypotestosteronism 05/07/2010  . LEG CRAMPS, IDIOPATHIC  08/14/2009  . Insomnia 08/14/2009  . BASAL CELL CARCINOMA, NOSE 08/14/2009  . OBESITY 08/02/2009  . HYPERTENSION, HEART CONTROLLED W/O ASSOC CHF 03/28/2008  . CAD, NATIVE VESSEL 03/28/2008  . G E R D 01/12/2007  . Morbid obesity ([place]) 09/15/2006  . HYPERLIPIDEMIA 07/01/2006  . Essential hypertension 07/01/2006  . Coronary atherosclerosis 07/01/2006  . COPD 07/01/2006  . OSTEOARTHRITIS 07/01/2006    Past Surgical History:  Procedure Laterality Date  .  APPENDECTOMY    . [place]  . CARDIAC CATHETERIZATION  01/2013  . CATARACT EXTRACTION, BILATERAL    . CHOLECYSTECTOMY    . COLONOSCOPY    . CORONARY ANGIOPLASTY  2004  . EYE SURGERY Bilateral    cataracts  . IR GENERIC HISTORICAL  10/05/2015   IR US GUIDE VASC ACCESS RIGHT 10/05/2015 Marybelle Killings, MD [facility]INTER[facility] RAD  . IR GENERIC HISTORICAL  10/05/2015   IR FLUORO GUIDE PORT INSERTION RIGHT 10/05/2015 Marybelle Killings, MD [facility]  . LEFT HEART CATHETERIZATION WITH CORONARY ANGIOGRAM N/A 06/18/2011   Procedure: LEFT HEART CATHETERIZATION WITH CORONARY ANGIOGRAM;  Surgeon: Peter M Martinique, MD;  Location: [facility] CATH LAB;  Service: Cardiovascular;  Laterality: N/A;  . LEFT HEART CATHETERIZATION WITH CORONARY ANGIOGRAM N/A 01/27/2013   Procedure: LEFT HEART CATHETERIZATION WITH CORONARY ANGIOGRAM;  Surgeon: Burnell Blanks, MD;  Location: [facility] CATH LAB;  Service: Cardiovascular;  Laterality: N/A;  . LUMBAR LAMINECTOMY/DECOMPRESSION MICRODISCECTOMY N/A 02/07/2015   Procedure: Lumbar three-Sacral one Decompression;  Surgeon: Kevan Ny Ditty, MD;  Location: [place] NEURO ORS;  Service: Neurosurgery;  Laterality: N/A;  L3 to S1 Decompression  . MULTIPLE TOOTH EXTRACTIONS    . ORCHIECTOMY Right 09/01/2015   Procedure: RIGHT ORCHIECTOMY;  Surgeon: Kathie Rhodes, MD;  Location: [facility] ORS;  Service: Urology;  Laterality: Right;  . ROTATOR CUFF REPAIR Left        Home Medications    Prior to Admission medications   Medication Sig Start Date End Date Taking? Authorizing Provider  ACCU-CHEK AVIVA PLUS test strip TEST 3 TIMES A DAY Patient taking differently: TEST 2 TIMES A DAY 10/27/15   [name], Alinda Sierras, MD  acetaminophen (TYLENOL) 500 MG tablet Take 1,000 mg by mouth every 6 (six) hours as needed (for pain/fever/headaches.).     [provider]  atorvastatin (LIPITOR) 40 MG tablet TAKE 1 TABLET BY MOUTH DAILY 11/24/15   [name], Alinda Sierras, MD  clonazePAM (KLONOPIN) 1 MG tablet  TAKE 1 TABLET EVERY DAY AT BEDTIME AS NEEDED 06/23/15   [name], Alinda Sierras, MD  clopidogrel (PLAVIX) 75 MG tablet TAKE 1 TABLET BY MOUTH DAILY 11/10/15   Burnell Blanks, MD  dexamethasone (DECADRON) 4 MG tablet Take 2 tablets with food. Start the day after Bendamustine for 2 days. 05/10/16   Volanda Napoleon, MD  docusate sodium (COLACE) 50 MG capsule Take 1 capsule (50 mg total) by mouth 2 (two) times daily as needed (for constipation). 04/22/16   [name], Alinda Sierras, MD  fluconazole (DIFLUCAN) 100 MG tablet Take 1 tablet (100 mg total) by mouth daily. 04/29/16   Volanda Napoleon, MD  furosemide (LASIX) 40 MG tablet TAKE 1 TABLET BY MOUTH EVERY DAY 04/23/16   [place], Holli Humbles, NP  gabapentin (NEURONTIN) 300 MG capsule Take 2 capsules (600 mg total) by mouth 3 (three) times daily. 05/28/16   [name], Alinda Sierras, MD  insulin lispro (HUMALOG) 100 UNIT/ML injection Inject 4 Units into the skin daily with breakfast.     [provider]  metFORMIN (GLUCOPHAGE) 500 MG tablet TAKE 1 TABLET BY MOUTH TWICE A DAY WITH A MEAL 06/21/16   [name], Alinda Sierras, MD  metoprolol succinate (TOPROL-XL) 100 MG 24 hr tablet TAKE 1 TABLET EVERY DAY IMMEDIATELY FOLLOWING A MEAL 04/03/16   [name], Alinda Sierras, MD  Multiple Vitamins-Minerals (CENTRUM SILVER 50+MEN) TABS Take 1 tablet by mouth daily.    [provider]  nitroGLYCERIN (NITROSTAT) 0.4 MG SL tablet Place 1 tablet (0.4 mg total) under the tongue every 5 (five) minutes as needed. Chest pain 01/20/14   Burnell Blanks, MD  ondansetron (ZOFRAN) 4 MG tablet Take 1 tablet (4 mg total) by mouth every 8 (eight) hours as needed for nausea or vomiting. 05/10/16   Volanda Napoleon, MD  oxybutynin (DITROPAN XL) 15 MG 24 hr tablet Take 15 mg by mouth every morning. 02/14/16   [provider]  pantoprazole (PROTONIX) 40 MG tablet TAKE 1 TABLET BY MOUTH EVERY DAY 12/11/15   [name], Alinda Sierras, MD  pramipexole (MIRAPEX) 1.5 MG tablet MAY TAKE 1 TO 1 AND  1/2 TABLETS BY MOUTH AT BEDTIME 04/09/16   [name], Alinda Sierras, MD  tamsulosin (FLOMAX) 0.4 MG CAPS capsule Take 0.4 mg by mouth at bedtime.    [provider]  temazepam (RESTORIL) 30 MG capsule Take 1 capsule (30 mg total) by mouth at bedtime as needed for sleep. 06/11/16   Laurey Morale, MD  TOUJEO SOLOSTAR 300 UNIT/ML SOPN INJECT 20 UNITS INTO THE SKIN DAILY AFTER BREAKFAST. 06/06/16   [name], Alinda Sierras, MD  Trospium Chloride 60 MG CP24 Take 60 mg by mouth daily.    [provider]  vitamin B-12 (CYANOCOBALAMIN) 1000 MCG tablet Take 1,000 mcg by mouth daily.    [provider]  warfarin (COUMADIN) 5 MG tablet Take as directed by anticoagulation clinic 06/24/16   [name], MD    Family History Family History  Problem Relation Age of Onset  . Alzheimer's disease Mother   . Heart disease Mother   . Heart disease Father   . Migraines Father   . Ulcers Father   . Prostate cancer Brother   . Coronary artery disease Unknown        Male 1st degree relative <50  . Coronary artery disease Unknown        male 1st degree relative <60  . Heart disease Sister   . Obesity Sister        Morbid  . Arthritis Sister   . Heart disease Brother   . Arthritis Brother   . Sleep apnea Son   . Obesity Son   . Migraines Daughter   . Thyroid disease Daughter     Social History Social History  Substance Use Topics  . Smoking status: Former Smoker    Packs/day: 1.50    Years: 20.00    Types: Cigarettes    Quit date: 04/05/1978  . Smokeless tobacco: Never Used  . Alcohol use No     Allergies   Ace inhibitors; Codeine; and Penicillins   Review of Systems Review of Systems  Constitutional: Positive for appetite change (decreased appetite).  Respiratory: Negative for shortness of breath.   Cardiovascular: Positive for leg swelling. Negative for chest pain.  Neurological: Positive for numbness (tingling). Negative for syncope and light-headedness.  All other  systems reviewed and are negative.    Physical Exam Updated Vital Signs BP 122/60   Pulse 94   Temp 99.5 F (37.5 C) (  Oral)   Resp 18   SpO2 95%   Physical Exam  Constitutional: He is oriented to person, place, and time. No distress.  Elderly, no acute distress  HENT:  Head: Normocephalic and atraumatic.  Cardiovascular: Normal rate, regular rhythm and normal heart sounds.   No murmur heard. Pulmonary/Chest: Effort normal and breath sounds normal. No respiratory distress. He has no wheezes.  Abdominal: Soft. There is no tenderness.  Musculoskeletal: He exhibits edema.  2+ bilateral lower extremity edema, symmetric, no overlying skin changes  Neurological: He is alert and oriented to person, place, and time.  Skin: Skin is warm and dry.  Psychiatric: He has a normal mood and affect.  Nursing note and vitals reviewed.    ED Treatments / Results   DIAGNOSTIC STUDIES: Oxygen Saturation is 98% on RA, normal by my interpretation.   COORDINATION OF CARE: 12:14 AM-Discussed next steps with pt. Pt verbalized understanding and is agreeable with the plan.   Labs (all labs ordered are listed, but only abnormal results are displayed) Labs Reviewed  CBC WITH DIFFERENTIAL/PLATELET - Abnormal; Notable for the following:       Result Value   RBC 3.45 (*)    Hemoglobin 9.9 (*)    HCT 30.6 (*)    RDW 15.7 (*)    Platelets 127 (*)    All other components within normal limits  BASIC METABOLIC PANEL - Abnormal; Notable for the following:    Glucose, Bld 111 (*)    Calcium 8.1 (*)    GFR calc non Af Amer 57 (*)    All other components within normal limits  PROTIME-INR - Abnormal; Notable for the following:    Prothrombin Time 30.3 (*)    All other components within normal limits    EKG  EKG Interpretation None       Radiology No results found.  Procedures Procedures (including critical care time)  Medications Ordered in ED Medications  furosemide (LASIX)  injection 80 mg (not administered)     Initial Impression / Assessment and Plan / ED Course  I have reviewed the triage vital signs and the nursing notes.  Pertinent labs & imaging results that were available during my care of the patient were reviewed by me and considered in my medical decision making (see chart for details).     Patient presents with lower extremity swelling bilaterally and increasing weakness. He is actively being treated for cancer. He is nontoxic. Denies chest pain or shortness of breath. No history of heart failure. Physical exam is notable for pitting edema bilaterally. Recently increased Lasix. He is otherwise nontoxic-appearing. Lab work is largely reassuring. He was given a total of 80 mg of IV Lasix. PT/INR is therapeutic. Would doubt DVT given clinical exam; however, it is reasonable to rule this out. Unfortunately, patient is unable to bear weight safely when tried to ambulate. Feel he is at increased fall risk and will need to be admitted for physical therapy, occupational therapy, and diuresis.   Final Clinical Impressions(s) / ED Diagnoses   Final diagnoses:  Pain and swelling of left lower extremity  Weakness    New Prescriptions New Prescriptions   No medications on file   I personally performed the services described in this documentation, which was scribed in my presence. The recorded information has been reviewed and is accurate.     Merryl Hacker, MD 07/01/16 [phone]

## 2016-07-01 NOTE — ED Notes (Signed)
R chest port accessed using 20g port needle.  Pt tolerated well.

## 2016-07-01 NOTE — ED Notes (Signed)
Pt and family provided food and beverages.

## 2016-07-01 NOTE — H&P (Addendum)
History and Physical    Gary Yates. SEG:[id number] DOB: 1933/01/09 DOA: 06/30/2016  Referring MD/NP/PA: Dr. Dina Rich PCP: [name], MD  Patient coming from: Home  Chief Complaint: Falls with leg swelling  HPI: Gary Yates. is a 83 y.o. 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 Patient notes that the severity of swelling intermittently waxed and waned in severity until the last week where it's been at its worst. Patient was evaluated by his PCP who had given him a prescription for TED hose and Lasix 40 mg to be taken up to twice daily. Patient reports doing as advised with some mild improvement temporarily. However, started developing numbness tingling sensation in his feet and had fallen at least 3 times yesterday. He did not have any trauma to his head and does not complain of any pain from the falls. Patient denies having any fever, chills, rash, wounds, chest pain, nausea, vomiting, weight gain,  shortness of breath, or history of CHF. Patient states that his weight has actually been declining and that he's had no appetite since starting on chemotherapy. He is followed by Dr. Marin Olp and had a PET scan scheduled for this upcoming Tuesday. His last chemotherapy treatment was approximately 1 week ago from last Thursday.  ED Course: On admission to the emergency department patient was seen to be afebrile with vital signs relatively within normal limits. Labs revealed WBC 4.9, hemoglobin 9.9, platelets 127, INR 2.83. Due to the worsening swelling of his lower extremity patient was given 80 mg of Lasix IV while in the emergency department. Patient was being  set up to be discharged but was unable to ambulate without 2 people max assistance. [facility] called to admit.   Review of Systems: As per HPI otherwise 10 point review of systems negative.   Past Medical History:  Diagnosis Date  . Anxiety   . Atrial fibrillation ([place])   . COPD (chronic obstructive pulmonary disease) ([place])    pt. denies  . Coronary artery disease    a. h/o Overlapping stents RCA;  b. 06/2011 Cath: patent stents, nonobs dzs, NL EF.  . Diabetic peripheral neuropathy ([place])   . Diffuse non-Hodgkin's lymphoma of testis ([place]) 09/28/2015  . DM (diabetes mellitus) ([place])    Type 2, peripheral neuropathy.  . Dyspnea    with exertion  . Dysrhythmia   . GERD (gastroesophageal reflux disease)   . Headache   . History of bronchitis   . History of kidney stones   . Hyperlipidemia   . Hypertension   . Low testosterone   . Nephrolithiasis   . Osteoarthritis    shoulder  . Restless leg   . SVT (supraventricular tachycardia) ([place])   . Urinary frequency   . Wears partial dentures    upper and lower    Past Surgical History:  Procedure Laterality Date  . APPENDECTOMY    . [place]  . CARDIAC CATHETERIZATION  01/2013  . CATARACT EXTRACTION, BILATERAL    . CHOLECYSTECTOMY    . COLONOSCOPY    . CORONARY ANGIOPLASTY  2004  . EYE SURGERY Bilateral    cataracts  . IR GENERIC HISTORICAL  10/05/2015   IR US  GUIDE VASC ACCESS RIGHT 10/05/2015 Marybelle Killings, MD [facility]-INTER[facility]  . IR GENERIC HISTORICAL  10/05/2015   IR FLUORO GUIDE PORT INSERTION RIGHT 10/05/2015 Marybelle Killings, MD [facility]  . LEFT HEART CATHETERIZATION WITH CORONARY ANGIOGRAM N/A 06/18/2011   Procedure: LEFT HEART CATHETERIZATION WITH CORONARY ANGIOGRAM;  Surgeon: Peter M Martinique, MD;  Location: [facility] CATH LAB;  Service: Cardiovascular;  Laterality: N/A;  . LEFT HEART CATHETERIZATION WITH CORONARY ANGIOGRAM N/A 01/27/2013   Procedure: LEFT HEART CATHETERIZATION WITH CORONARY ANGIOGRAM;  Surgeon: Burnell Blanks, MD;  Location: [facility] CATH LAB;  Service: Cardiovascular;  Laterality: N/A;  . LUMBAR LAMINECTOMY/DECOMPRESSION MICRODISCECTOMY N/A 02/07/2015   Procedure: Lumbar three-Sacral one Decompression;  Surgeon: Kevan Ny Ditty, MD;  Location: [place] NEURO ORS;  Service: Neurosurgery;  Laterality: N/A;  L3 to S1 Decompression  . MULTIPLE TOOTH EXTRACTIONS    . ORCHIECTOMY Right 09/01/2015   Procedure: RIGHT ORCHIECTOMY;  Surgeon: Kathie Rhodes, MD;  Location: [facility] ORS;  Service: Urology;  Laterality: Right;  . ROTATOR CUFF REPAIR Left      reports that he quit smoking about 38 years ago. His smoking use included Cigarettes. He has a 30.00 pack-year smoking history. He has never used smokeless tobacco. He reports that he does not drink alcohol or use drugs.  Allergies  Allergen Reactions  . Ace Inhibitors Other (See Comments)    cough  . Codeine Nausea Only and Rash       . Penicillins Rash    Childhood allergy Has patient had a PCN reaction causing immediate rash, facial/tongue/throat swelling, SOB or lightheadedness with hypotension: Yes Has patient had a PCN reaction causing severe rash involving mucus membranes or skin necrosis: Yes Has patient had a PCN reaction that required hospitalization No Has patient had a PCN reaction occurring within the last 10 years: No If all of the above answers are "NO", then may proceed with Cephalosporin use.     Family History  Problem Relation Age of Onset  . Alzheimer's disease Mother   . Heart disease Mother   . Heart disease Father   . Migraines Father   . Ulcers Father   . Prostate cancer Brother   . Coronary artery disease Unknown        Male 1st degree relative <50  . Coronary artery disease Unknown        male 1st degree relative <60  . Heart disease Sister   . Obesity Sister        Morbid  . Arthritis Sister   . Heart disease Brother   . Arthritis Brother   . Sleep apnea Son   . Obesity Son   . Migraines Daughter   . Thyroid  disease Daughter     Prior to Admission medications   Medication Sig Start Date End Date Taking? Authorizing Provider  ACCU-CHEK AVIVA PLUS test strip TEST 3 TIMES A DAY Patient taking differently: TEST 2 TIMES A DAY 10/27/15  Yes [name], Alinda Sierras, MD  acetaminophen (TYLENOL) 500 MG tablet Take 1,000 mg by mouth every 6 (six) hours as needed (for pain/fever/headaches.).    Yes [provider]  atorvastatin (LIPITOR) 40 MG tablet TAKE 1 TABLET BY MOUTH DAILY 11/24/15  Yes [name], Alinda Sierras, MD  clopidogrel (PLAVIX) 75 MG tablet TAKE 1 TABLET BY MOUTH DAILY 11/10/15  Yes Burnell Blanks, MD  docusate sodium (COLACE) 50 MG capsule Take 1 capsule (50 mg total) by mouth 2 (two) times daily as needed (for constipation). 04/22/16  Yes [name], Alinda Sierras, MD  furosemide (LASIX) 40 MG tablet TAKE 1 TABLET BY MOUTH EVERY DAY 04/23/16  Yes [place], Holli Humbles, NP  gabapentin (NEURONTIN) 300 MG capsule Take 2 capsules (600 mg total) by mouth 3 (three) times daily. 05/28/16  Yes [name], Alinda Sierras, MD  HYDROcodone-acetaminophen (NORCO) 7.5-325 MG tablet Take 1 tablet by mouth every 4 (four) hours as needed for moderate pain.   Yes [provider]  insulin lispro (HUMALOG) 100 UNIT/ML injection Inject 4 Units into the skin daily with breakfast.    Yes [provider]  metFORMIN (GLUCOPHAGE) 500 MG tablet TAKE 1 TABLET BY MOUTH TWICE A DAY WITH A MEAL 06/21/16  Yes [name], Alinda Sierras, MD  metoprolol succinate (TOPROL-XL) 100 MG 24 hr tablet TAKE 1 TABLET EVERY DAY IMMEDIATELY FOLLOWING A MEAL 04/03/16  Yes [name], Alinda Sierras, MD  Multiple Vitamins-Minerals (CENTRUM SILVER 50+MEN) TABS Take 1 tablet by mouth daily.   Yes [provider]  nitroGLYCERIN (NITROSTAT) 0.4 MG SL tablet Place 1 tablet (0.4 mg total) under the tongue every 5 (five) minutes as needed. Chest pain 01/20/14  Yes Burnell Blanks, MD  ondansetron (ZOFRAN) 4 MG tablet Take 1 tablet (4 mg total) by  mouth every 8 (eight) hours as needed for nausea or vomiting. 05/10/16  Yes Volanda Napoleon, MD  oxybutynin (DITROPAN XL) 15 MG 24 hr tablet Take 15 mg by mouth at bedtime.  02/14/16  Yes [provider]  oxymetazoline (AFRIN) 0.05 % nasal spray Place 1-2 sprays into both nostrils 2 (two) times daily as needed for congestion.   Yes [provider]  pantoprazole (PROTONIX) 40 MG tablet TAKE 1 TABLET BY MOUTH EVERY DAY 12/11/15  Yes [name], Alinda Sierras, MD  pramipexole (MIRAPEX) 1.5 MG tablet MAY TAKE 1 TO 1 AND 1/2 TABLETS BY MOUTH AT BEDTIME 04/09/16  Yes [name], Alinda Sierras, MD  tamsulosin (FLOMAX) 0.4 MG CAPS capsule Take 0.4 mg by mouth at bedtime.   Yes [provider]  temazepam (RESTORIL) 30 MG capsule Take 1 capsule (30 mg total) by mouth at bedtime as needed for sleep. 06/11/16  Yes Laurey Morale, MD  TOUJEO SOLOSTAR 300 UNIT/ML SOPN INJECT 20 UNITS INTO THE SKIN DAILY AFTER BREAKFAST. 06/06/16  Yes [name], Alinda Sierras, MD  Trospium Chloride 60 MG CP24 Take 60 mg by mouth daily.   Yes [provider]  vitamin B-12 (CYANOCOBALAMIN) 1000 MCG tablet Take 1,000 mcg by mouth daily.   Yes [provider]  warfarin (COUMADIN) 5 MG tablet Take as directed by anticoagulation clinic Patient taking differently: Take 7.5-10 mg by mouth every evening. Monday and Friday take 10 mg. All other days take 7.5 mg 06/24/16  Yes [name], Alinda Sierras, MD    Physical Exam:   Constitutional: Obese elderly male in NAD, calm, comfortable Vitals:   06/30/16 2105 07/01/16 0120 07/01/16 0145 07/01/16 0215  BP: 101/61 114/63 110/62 122/60  Pulse: 91 93 93 94  Resp: 18     Temp: 99.5 F (37.5 C)     TempSrc: Oral     SpO2: 98% 96% 98% 95%   Eyes: PERRL, lids and conjunctivae normal ENMT: Mucous membranes are moist. Posterior pharynx clear of any exudate or lesions. Neck: normal, supple, no masses, no thyromegaly Respiratory: clear to auscultation bilaterally, no wheezing, no  crackles. Normal respiratory effort. No accessory muscle use.  Cardiovascular: Regular rate and rhythm, no murmurs / rubs / gallops. 2+ bilateral lower extremity edema that  appears symmetric. 2+ pedal pulses. No carotid bruits.  Abdomen: Moderate tenderness to palpation of the right abdominal quadrants. Bowel sounds positive.  Musculoskeletal: no clubbing / cyanosis. No joint deformity upper and lower extremities. Good ROM, no contractures. Normal muscle tone.  Skin: no rashes, lesions, ulcers. No induration Neurologic: CN 2-12 grossly intact. Sensation intact, DTR normal. Strength 5/5 in all 4.  Psychiatric: Normal judgment and insight. Alert and oriented x 3. Normal mood.     Labs on Admission: I have personally reviewed following labs and imaging studies  CBC:  Recent Labs Lab 07/01/16 0030  WBC 4.9  NEUTROABS 1.7  HGB 9.9*  HCT 30.6*  MCV 88.7  PLT 211*   Basic Metabolic Panel:  Recent Labs Lab 07/01/16 0030  NA 137  K 3.5  CL 102  CO2 24  GLUCOSE 111*  BUN 11  CREATININE 1.16  CALCIUM 8.1*   GFR: Estimated Creatinine Clearance: 60.8 mL/min (by C-G formula based on SCr of 1.16 mg/dL). Liver Function Tests: No results for input(s): AST, ALT, ALKPHOS, BILITOT, PROT, ALBUMIN in the last 168 hours. No results for input(s): LIPASE, AMYLASE in the last 168 hours. No results for input(s): AMMONIA in the last 168 hours. Coagulation Profile:  Recent Labs Lab 07/01/16 0038  INR 2.83   Cardiac Enzymes: No results for input(s): CKTOTAL, CKMB, CKMBINDEX, TROPONINI in the last 168 hours. BNP (last 3 results) No results for input(s): PROBNP in the last 8760 hours. HbA1C:  Recent Labs  06/28/16 0932  HGBA1C 6.5   CBG: No results for input(s): GLUCAP in the last 168 hours. Lipid Profile: No results for input(s): CHOL, HDL, LDLCALC, TRIG, CHOLHDL, LDLDIRECT in the last 72 hours. Thyroid Function Tests: No results for input(s): TSH, T4TOTAL, FREET4, T3FREE,  THYROIDAB in the last 72 hours. Anemia Panel: No results for input(s): VITAMINB12, FOLATE, FERRITIN, TIBC, IRON, RETICCTPCT in the last 72 hours. Urine analysis:    Component Value Date/Time   COLORURINE YELLOW 01/21/2016 [place] 01/21/2016 1613   LABSPEC 1.011 01/21/2016 1613   PHURINE 7.0 01/21/2016 1613   GLUCOSEU 50 (A) 01/21/2016 1613   HGBUR NEGATIVE 01/21/2016 1613   HGBUR trace-intact 12/29/2006 0827   BILIRUBINUR NEGATIVE 01/21/2016 1613   KETONESUR NEGATIVE 01/21/2016 1613   PROTEINUR NEGATIVE 01/21/2016 1613   UROBILINOGEN 0.2 12/29/2006 0827   NITRITE NEGATIVE 01/21/2016 1613   LEUKOCYTESUR SMALL (A) 01/21/2016 1613   Sepsis Labs: No results found for this or any previous visit (from the past 240 hour(s)).   Radiological Exams on Admission: No results found.    Assessment/Plan Bilateral lower extremity swelling with falls: Patient presents with one month of progressively worsening lower extremity swelling. INR appears to be therapeutic. Symptoms persistent despite TED hose and oral Lasix. Patient given 80 mg of Lasix in the ED.  - Admit to a Telemetry bed - Strict ins and outs - Doppler duplex ultrasound of lower extremities - Physical therapy to eval and treat  Abdominal pain: Acute. Patient noted to have significant tenderness to palpation on abdominal exam. Question progression of underlying malignancy/obstruction of blood flow as possible cause of ower extremity swelling. - Check CT scan of the abd/ pelvis with contrast  Diffuse non hodgkin's lymphoma with reported metastases to the adrenal glands. - will need to notify Dr. Marin Olp that the patient is admitted into the hospital.   Diabetes mellitus type II with complications of peripheral neuropathy: Last hemoglobin A1c 6.5 on 06/28/2016. - Hypoglycemic protocol - Hold  metformin - Continue home glargine regimen - CBGs every before mealswith sensitive SSI - Continue gabapentin for  neuropathy  Paroxysmal atrial fibrillation on chronic anticoagulation: INR therapeutic at 2.84 - Coumadin per pharmacy  Essential hypertension - Continue metoprolol in a.m  CAD  s/p stent  - Continue Plavix   Hyperlipidemia - Continue atorvastatin  GERD - Continue Protonix   BPH - Continue Flomax  DVT prophylaxis: Warfarin per pharmacy  Code Status: Full Family Communication: Discussed plan of care with patient and family present at bedside Disposition Plan:  likely discharge home once medically stable  Consults called: None Admission status: Observation  Norval Morton MD [facility] Pager [phone]  If 7PM-7AM, please contact night-coverage [email or web address] Password [facility]  07/01/2016, 3:58 AM

## 2016-07-01 NOTE — Progress Notes (Signed)
CT called and asked for verification of IV access. Informed that pt has a port, CT stated that was acceptable   Gary Yates

## 2016-07-02 ENCOUNTER — Ambulatory Visit (HOSPITAL_COMMUNITY): Payer: Medicare Other

## 2016-07-02 DIAGNOSIS — K219 Gastro-esophageal reflux disease without esophagitis: Secondary | ICD-10-CM | POA: Diagnosis not present

## 2016-07-02 DIAGNOSIS — J449 Chronic obstructive pulmonary disease, unspecified: Secondary | ICD-10-CM | POA: Diagnosis not present

## 2016-07-02 DIAGNOSIS — E785 Hyperlipidemia, unspecified: Secondary | ICD-10-CM | POA: Diagnosis not present

## 2016-07-02 DIAGNOSIS — G2581 Restless legs syndrome: Secondary | ICD-10-CM | POA: Diagnosis not present

## 2016-07-02 DIAGNOSIS — E43 Unspecified severe protein-calorie malnutrition: Secondary | ICD-10-CM | POA: Insufficient documentation

## 2016-07-02 DIAGNOSIS — I480 Paroxysmal atrial fibrillation: Secondary | ICD-10-CM | POA: Diagnosis not present

## 2016-07-02 DIAGNOSIS — Z6833 Body mass index (BMI) 33.0-33.9, adult: Secondary | ICD-10-CM | POA: Diagnosis not present

## 2016-07-02 DIAGNOSIS — C8339 Diffuse large B-cell lymphoma, extranodal and solid organ sites: Secondary | ICD-10-CM

## 2016-07-02 DIAGNOSIS — C8590 Non-Hodgkin lymphoma, unspecified, unspecified site: Secondary | ICD-10-CM | POA: Diagnosis not present

## 2016-07-02 DIAGNOSIS — F419 Anxiety disorder, unspecified: Secondary | ICD-10-CM | POA: Diagnosis not present

## 2016-07-02 DIAGNOSIS — I110 Hypertensive heart disease with heart failure: Secondary | ICD-10-CM | POA: Diagnosis not present

## 2016-07-02 DIAGNOSIS — I472 Ventricular tachycardia, unspecified: Secondary | ICD-10-CM

## 2016-07-02 DIAGNOSIS — E876 Hypokalemia: Secondary | ICD-10-CM | POA: Diagnosis not present

## 2016-07-02 DIAGNOSIS — Z7901 Long term (current) use of anticoagulants: Secondary | ICD-10-CM | POA: Diagnosis not present

## 2016-07-02 DIAGNOSIS — Z79899 Other long term (current) drug therapy: Secondary | ICD-10-CM | POA: Diagnosis not present

## 2016-07-02 DIAGNOSIS — E1142 Type 2 diabetes mellitus with diabetic polyneuropathy: Secondary | ICD-10-CM | POA: Diagnosis not present

## 2016-07-02 DIAGNOSIS — I4891 Unspecified atrial fibrillation: Secondary | ICD-10-CM | POA: Diagnosis not present

## 2016-07-02 DIAGNOSIS — I5031 Acute diastolic (congestive) heart failure: Secondary | ICD-10-CM | POA: Diagnosis not present

## 2016-07-02 DIAGNOSIS — I2510 Atherosclerotic heart disease of native coronary artery without angina pectoris: Secondary | ICD-10-CM | POA: Diagnosis not present

## 2016-07-02 DIAGNOSIS — R600 Localized edema: Secondary | ICD-10-CM | POA: Diagnosis not present

## 2016-07-02 DIAGNOSIS — R531 Weakness: Secondary | ICD-10-CM | POA: Diagnosis not present

## 2016-07-02 DIAGNOSIS — Z87891 Personal history of nicotine dependence: Secondary | ICD-10-CM | POA: Diagnosis not present

## 2016-07-02 DIAGNOSIS — Z794 Long term (current) use of insulin: Secondary | ICD-10-CM | POA: Diagnosis not present

## 2016-07-02 DIAGNOSIS — Z880 Allergy status to penicillin: Secondary | ICD-10-CM | POA: Diagnosis not present

## 2016-07-02 LAB — CBC
HCT: 31 % — ABNORMAL LOW (ref 39.0–52.0)
Hemoglobin: 10.1 g/dL — ABNORMAL LOW (ref 13.0–17.0)
MCH: 28.8 pg (ref 26.0–34.0)
MCHC: 32.6 g/dL (ref 30.0–36.0)
MCV: 88.3 fL (ref 78.0–100.0)
PLATELETS: 128 10*3/uL — AB (ref 150–400)
RBC: 3.51 MIL/uL — ABNORMAL LOW (ref 4.22–5.81)
RDW: 15.8 % — AB (ref 11.5–15.5)
WBC: 5 10*3/uL (ref 4.0–10.5)

## 2016-07-02 LAB — BASIC METABOLIC PANEL
Anion gap: 13 (ref 5–15)
BUN: 8 mg/dL (ref 6–20)
CALCIUM: 7.7 mg/dL — AB (ref 8.9–10.3)
CO2: 23 mmol/L (ref 22–32)
CREATININE: 1.02 mg/dL (ref 0.61–1.24)
Chloride: 104 mmol/L (ref 101–111)
GFR calc non Af Amer: >60 mL/min (ref >60)
Glucose, Bld: 124 mg/dL — ABNORMAL HIGH (ref 65–99)
Potassium: 2.8 mmol/L — ABNORMAL LOW (ref 3.5–5.1)
Sodium: 140 mmol/L (ref 135–145)

## 2016-07-02 LAB — GLUCOSE, CAPILLARY
GLUCOSE-CAPILLARY: 117 mg/dL — AB (ref 65–99)
Glucose-Capillary: 121 mg/dL — ABNORMAL HIGH (ref 65–99)
Glucose-Capillary: 141 mg/dL — ABNORMAL HIGH (ref 65–99)
Glucose-Capillary: 127 mg/dL — ABNORMAL HIGH (ref 65–99)

## 2016-07-02 LAB — PROTIME-INR
INR: 2.29
PROTHROMBIN TIME: 25.6 s — AB (ref 11.4–15.2)

## 2016-07-02 LAB — MAGNESIUM: Magnesium: 1.1 mg/dL — ABNORMAL LOW (ref 1.7–2.4)

## 2016-07-02 MED ORDER — POTASSIUM CHLORIDE CRYS ER 20 MEQ PO TBCR
40.0000 meq | EXTENDED_RELEASE_TABLET | Freq: Every day | ORAL | Status: DC
Start: 2016-07-02 — End: 2016-07-03
  Administered 2016-07-02 – 2016-07-03 (×2): 40 meq via ORAL
  Filled 2016-07-02 (×2): qty 2

## 2016-07-02 MED ORDER — FUROSEMIDE 10 MG/ML IJ SOLN: 30.0000 mg | Freq: Every day | INTRAMUSCULAR | Status: DC

## 2016-07-02 MED ORDER — POTASSIUM CHLORIDE CRYS ER 20 MEQ PO TBCR
60.0000 meq | EXTENDED_RELEASE_TABLET | Freq: Once | ORAL | Status: AC
  Administered 2016-07-02: 60 meq via ORAL
  Filled 2016-07-02: qty 3

## 2016-07-02 MED ORDER — FUROSEMIDE 10 MG/ML IJ SOLN
40.0000 mg | Freq: Every day | INTRAMUSCULAR | Status: DC
  Administered 2016-07-03: 40 mg via INTRAVENOUS
  Filled 2016-07-02: qty 4

## 2016-07-02 MED ORDER — SODIUM CHLORIDE 0.9 % IV SOLN
1.0000 g | Freq: Once | INTRAVENOUS | Status: AC
  Administered 2016-07-02: 1 g via INTRAVENOUS
  Filled 2016-07-02: qty 10

## 2016-07-02 MED ORDER — GLUCERNA SHAKE PO LIQD
237.0000 mL | Freq: Three times a day (TID) | ORAL | Status: DC
  Administered 2016-07-02 – 2016-07-03 (×3): 237 mL via ORAL

## 2016-07-02 MED ORDER — MAGNESIUM SULFATE 2 GM/50ML IV SOLN
2.0000 g | Freq: Once | INTRAVENOUS | Status: AC
  Administered 2016-07-02: 2 g via INTRAVENOUS
  Filled 2016-07-02: qty 50

## 2016-07-02 NOTE — Progress Notes (Signed)
Paged MD regarding pt syncopal episode, awaiting call back MD Dungal at [place]

## 2016-07-02 NOTE — Progress Notes (Addendum)
Pt states he is non symptomatic, pt EKG handed to MD Wynetta Emery, MD at bedside now, MD aware of low potassium levels  Gary Yates Leory Plowman

## 2016-07-02 NOTE — Progress Notes (Signed)
30 plus beat of v tach per CCMD, second page to MD EKG at bedside Vital signs stable   Gary Yates

## 2016-07-02 NOTE — Progress Notes (Signed)
ANTICOAGULATION CONSULT NOTE - Initial Consult  Pharmacy Consult for Coumadin Indication: atrial fibrillation  Allergies  Allergen Reactions  . Ace Inhibitors Other (See Comments)    cough  . Codeine Nausea Only and Rash       . Penicillins Rash    Childhood allergy Has patient had a PCN reaction causing immediate rash, facial/tongue/throat swelling, SOB or lightheadedness with hypotension: Yes Has patient had a PCN reaction causing severe rash involving mucus membranes or skin necrosis: Yes Has patient had a PCN reaction that required hospitalization No Has patient had a PCN reaction occurring within the last 10 years: No If all of the above answers are "NO", then may proceed with Cephalosporin use.     Patient Measurements: Height: 5\' 9"  (175.3 cm) Weight: 225 lb (102.1 kg) (bed) IBW/kg (Calculated) : 70.7  Vital Signs: Temp: 97.8 F (36.6 C) (06/26 0827) Temp Source: Oral (06/26 0827) BP: 119/65 (06/26 0923) Pulse Rate: 99 (06/26 0923)  Labs:  Recent Labs  07/01/16 0030 07/01/16 0038 07/02/16 0508  HGB 9.9*  --  10.1*  HCT 30.6*  --  31.0*  PLT 127*  --  128*  LABPROT  --  30.3* 25.6*  INR  --  2.83 2.29  CREATININE 1.16  --  1.02    Estimated Creatinine Clearance: 66.9 mL/min (by C-G formula based on SCr of 1.02 mg/dL).    Assessment: 83 y.o. male admitted with LE swelling. He is on chronic coumadin for afib, INR 2.29 on home dose. CBC stable, No bleeding noted per chart.  LE doppler neg for DVT   PTA Coumadin dose: 7.5 mg daily except 10 mg MonFri Goal of Therapy:  INR 2-3 Heparin level 0.3-0.7 units/ml Monitor platelets by anticoagulation protocol: Yes   Plan:  Continue home Coumadin regimen 7.5 mg today Daily INR  Maryanna Shape, PharmD, BCPS  Clinical Pharmacist  Pager: [phone]   07/02/2016,10:26 AM

## 2016-07-02 NOTE — Progress Notes (Signed)
PROGRESS NOTE    Gary Yates.  EXB:[id number]  DOB: Feb 20, 1933  DOA: 06/30/2016 PCP: [name], MD   Brief Admission Hx: Gary Yates. is a 83 y.o. 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  MDM/Assessment & Plan:   Bilateral lower extremity swelling with falls: Patient presents with one month of progressively worsening lower extremity swelling. INR appears to be therapeutic. Symptoms persistent despite TED hose and oral Lasix. Patient given 80 mg of Lasix in the ED.  - Admit to a Telemetry bed - Strict ins and outs - Doppler duplex ultrasound of lower extremities - PT eval pending.   - Lasix diuresis, replace potassium   Acute diastolic CHF - IV lasix ordered, monitor I/Os, weights, electrolytes, Had echo 9/17, EF 55% grade1 DD.   Abdominal pain: Acute. Patient noted to have significant tenderness to palpation on abdominal exam. CT confirms progression of underlying malignancy - Dr. Marin Olp has ordered a PET Scan.  If we do see worsening disease, he might consider radiation therapy to the left adrenal mass. There is no indication for surgery, particular since he is a very poor surgical candidate.  Diffuse non hodgkin's lymphoma with reported metastases to the adrenal glands. - Dr. Marin Olp has seen the patient. See his consult notes.     Diabetes mellitus type 2 with complications of peripheral neuropathy: Last hemoglobin A1c 6.5 on 06/28/2016. - Hypoglycemic protocol - Hold metformin - Continue home glargine regimen - CBGs every before mealswith sensitive SSI - Continue gabapentin for neuropathy  CBG (last 3)   Recent Labs  07/01/16 1732 07/01/16 2149 07/02/16 0732  GLUCAP 106* 135* 121*   Paroxysmal atrial fibrillation on chronic anticoagulation: INR therapeutic at 2.84 - Coumadin per pharmacy  V tach - Pt had 30 beat run of Vtach this morning, asymptomatic, likely as he had not had his metoprolol and he has some electrolyte abnormalities with low K and low Calcium, will replete and continue to monitor.   Essential hypertension - Resume metoprolol  CAD  s/p stent  - Continue Plavix   Hyperlipidemia - Continue atorvastatin  GERD - Continue Protonix   BPH - Continue Flomax  DVT prophylaxis: Warfarin per pharmacy  Code Status: Full Family Communication: Discussed plan of care with patient and family present at bedside Disposition Plan:  likely discharge home once medically stable  Consults called: None  Subjective: Pt had episode of v tach 30 beats this morning, asymptomatic  Objective: Vitals:   07/01/16 2154 07/02/16 0623 07/02/16 0827 07/02/16 0923  BP: 111/66 91/66 (!) 100/50 119/65  Pulse: 84 92 97 99  Resp: 16 17 18 19   Temp: 98.8 F (37.1 C) 97.7 F (36.5 C) 97.8 F (36.6 C)   TempSrc: Oral Oral Oral   SpO2: 95% 94% 96% 97%  Weight:  102.1 kg (225 lb)    Height:        Intake/Output Summary (Last 24 hours) at 07/02/16 0952 Last data filed at 07/02/16 0900  Gross per 24 hour  Intake              360 ml  Output              900 ml  Net             -540 ml   Filed Weights   07/01/16 0654 07/02/16 0623  Weight: 104.3 kg (230 lb) 102.1 kg (225 lb)     REVIEW OF SYSTEMS  As per history otherwise all reviewed and reported negative  Exam:  General exam: awake, alert, NAD. Cooperative.   Respiratory system:  No increased work of breathing. Cardiovascular system: S1 & S2 heard, tachycardic.  Gastrointestinal system: Abdomen is nondistended, soft and nontender. Normal bowel sounds heard. Central nervous system: Alert and oriented. No focal neurological  deficits. Extremities: 2+ edema bilateral LEs.  Data Reviewed: Basic Metabolic Panel:  Recent Labs Lab 07/01/16 0030 07/02/16 0508  NA 137 140  K 3.5 2.8*  CL 102 104  CO2 24 23  GLUCOSE 111* 124*  BUN 11 8  CREATININE 1.16 1.02  CALCIUM 8.1* 7.7*   Liver Function Tests: No results for input(s): AST, ALT, ALKPHOS, BILITOT, PROT, ALBUMIN in the last 168 hours. No results for input(s): LIPASE, AMYLASE in the last 168 hours. No results for input(s): AMMONIA in the last 168 hours. CBC:  Recent Labs Lab 07/01/16 0030 07/02/16 0508  WBC 4.9 5.0  NEUTROABS 1.7  --   HGB 9.9* 10.1*  HCT 30.6* 31.0*  MCV 88.7 88.3  PLT 127* 128*   Cardiac Enzymes: No results for input(s): CKTOTAL, CKMB, CKMBINDEX, TROPONINI in the last 168 hours. CBG (last 3)   Recent Labs  07/01/16 1732 07/01/16 2149 07/02/16 0732  GLUCAP 106* 135* 121*   No results found for this or any previous visit (from the past 240 hour(s)).   Studies: Ct Abdomen Pelvis W Contrast  Result Date: 07/01/2016 CLINICAL DATA:  Progressive bilateral lower extremity swelling. Non-Hodgkin's lymphoma with adrenal involvement. Previous appendectomy and cholecystectomy. EXAM: CT ABDOMEN AND PELVIS WITH CONTRAST TECHNIQUE: Multidetector CT imaging of the abdomen and pelvis was performed using the standard protocol following bolus administration of intravenous contrast. CONTRAST:  125mL ISOVUE-300 IOPAMIDOL (ISOVUE-300) INJECTION 61% COMPARISON:  PET-CT dated 04/22/2016. FINDINGS: Lower chest: Small amount of linear atelectasis and scarring at the lung bases. Hepatobiliary: Cholecystectomy clips.  Unremarkable liver. Pancreas: Unremarkable. No pancreatic ductal dilatation or surrounding inflammatory changes. Spleen: Normal in size without focal abnormality. Adrenals/Urinary Tract: The previously demonstrated 4.5 x 3.0 cm left adrenal mass currently measures 5.4 x 4.4 cm on image number 26 of series 3. Stable bilateral renal cysts.  The current images demonstrate a 4 mm nodular density in the posterior aspect of the largest cyst on the right. This is seen on image 41 of series 3. Distended urinary bladder extending into the upper pelvis. Interval minimal dilatation of both renal collecting systems and ureters. No urinary tract calculi. Stomach/Bowel: Normal appearing stomach, small bowel and colon. Surgically absent appendix. Vascular/Lymphatic: Atheromatous arterial calcifications without aneurysm. These include the coronary arteries. No enlarged lymph nodes. Reproductive: Moderate to markedly enlarged prostate gland protruding into the base of the urinary bladder. Other: Small left inguinal hernia containing fat. Small umbilical and paraumbilical hernias containing fat. Musculoskeletal: Lumbar and lower thoracic spine degenerative changes. Mild levoconvex thoracolumbar scoliosis. Lumbar spine laminectomy defects. IMPRESSION: 1. Increased size of the left adrenal lymphomatous mass, currently measuring 5.4 x 4.4 cm. 2. Interval 4 mm nodular opacity in the posterior aspect of the largest right renal cyst. This most likely represents a small area of tumefactive debris. A small mass is less likely. This can be re-evaluated on follow-up imaging. 3. Small left inguinal, umbilical  and paraumbilical hernias containing fat. 4. Calcified coronary artery atherosclerosis. 5. Moderately to markedly enlarged prostate gland with interval distention of the urinary bladder and associated minimal bilateral hydronephrosis. Electronically Signed   By: Claudie Revering M.D.   On: 07/01/2016 20:14   Scheduled Meds: . atorvastatin  40 mg Oral Daily  . clopidogrel  75 mg Oral Daily  . feeding supplement (ENSURE ENLIVE)  237 mL Oral BID BM  . [START ON 07/03/2016] furosemide  30 mg Intravenous Daily  . gabapentin  600 mg Oral TID  . insulin aspart  0-9 Units Subcutaneous TID WC  . insulin glargine  20 Units Subcutaneous Daily  . metoprolol succinate  100 mg Oral  Daily  . oxybutynin  15 mg Oral QHS  . pantoprazole  40 mg Oral Daily  . potassium chloride  60 mEq Oral Once  . pramipexole  1 mg Oral QHS  . sodium chloride flush  3 mL Intravenous Q12H  . tamsulosin  0.4 mg Oral QHS  . Trospium Chloride  60 mg Oral Daily  . warfarin  7.5 mg Oral Once per day on Sun Tue Wed Thu Sat   And  . warfarin  10 mg Oral Once per day on Mon Fri  . Warfarin - Pharmacist Dosing Inpatient   Does not apply q1800   Continuous Infusions: . calcium gluconate      Principal Problem:   Bilateral lower extremity edema Active Problems:   CAD, NATIVE VESSEL   Hyperlipidemia   GERD (gastroesophageal reflux disease)   Peripheral neuropathy   Paroxysmal atrial fibrillation (HCC)   Long term current use of anticoagulant therapy   Diffuse non-Hodgkin's lymphoma of testis [facility])  Time spent:   Irwin Brakeman, MD, FAAFP [facility] Pager [phone]  If 7PM-7AM, please contact night-coverage [email or web address] Password [email or web address] 07/02/2016, 9:52 AM    LOS: 0 days

## 2016-07-02 NOTE — Consult Note (Signed)
Referral MD  Reason for Referral: Congestive heart failure; recurrent diffuse large cell non-Hodgkin's lymphoma   Chief Complaint  Patient presents with  . Leg Swelling  . Fall  : I had progressive leg swelling.  HPI: Gary Yates is well-known to me. He is an 83 year old white male. 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 He was diagnosed last year. He underwent a right orchiectomy. He had systemic chemotherapy with R-CHOP. He completed 4 cycles in November 2017. He then had some radiation therapy to the inguinal area.  He unfortunately relapsed in April. This was biopsy proven. He now has had chemotherapy with Rituxan/bendamustine/Velcade. He's had 2 cycles. He finished his second cycle on 06/20/2016.  He was admitted over the weekend with worsening of his heart failure. He has atrial fibrillation. He has diabetes. He has high blood pressure. He has a history of congestive heart failure. He's been diuresed. He's lost 10 pounds.  He did have a CT of the abdomen when he came in. This, unfortunately, showed that his left adrenal mass increased in size to 5.4 x 4.4 cm. There is no other sites of disease.  He is due for a PET scan today.  His labs showed potassium to be 2.8. His white cell count is 5. Hemoglobin 10.1. Platelet count 128,000.  He's had no abdominal pain. He's had no nausea or vomiting.  His appetite is doing okay.  His blood sugars have been holding pretty steady.  Overall, his performance status is ECOG 2    Past Medical History:  Diagnosis Date  . Anxiety   . Atrial fibrillation ([place])   . COPD (chronic obstructive pulmonary disease) ([place])    pt. denies  . Coronary artery disease    a. h/o Overlapping stents RCA;  b. 06/2011 Cath: patent stents, nonobs dzs, NL EF.  . Diabetic peripheral neuropathy ([place])   . Diffuse non-Hodgkin's lymphoma of testis ([place]) 09/28/2015  . DM (diabetes mellitus) ([place])    Type 2, peripheral  neuropathy.  . Dyspnea    with exertion  . Dysrhythmia   . GERD (gastroesophageal reflux disease)   . Headache   . History of bronchitis   . History of kidney stones   . Hyperlipidemia   . Hypertension   . Low testosterone   . Nephrolithiasis   . Osteoarthritis    shoulder  . Restless leg   . SVT (supraventricular tachycardia) ([place])   . Urinary frequency   . Wears partial dentures    upper and lower  :  Past Surgical History:  Procedure Laterality Date  . APPENDECTOMY    . [place]  . CARDIAC CATHETERIZATION  01/2013  . CATARACT EXTRACTION, BILATERAL    . CHOLECYSTECTOMY    . COLONOSCOPY    . CORONARY ANGIOPLASTY  2004  . EYE SURGERY Bilateral    cataracts  . IR GENERIC HISTORICAL  10/05/2015   IR US GUIDE VASC ACCESS RIGHT 10/05/2015 Marybelle Killings, MD [facility]INTER[facility] RAD  . IR GENERIC HISTORICAL  10/05/2015   IR FLUORO GUIDE PORT INSERTION RIGHT 10/05/2015 Marybelle Killings, MD [facility]INTER[facility]  . LEFT HEART CATHETERIZATION WITH CORONARY ANGIOGRAM N/A 06/18/2011   Procedure: LEFT HEART CATHETERIZATION WITH CORONARY ANGIOGRAM;  Surgeon: Peter M Martinique, MD;  Location: [facility] CATH LAB;  Service: Cardiovascular;  Laterality: N/A;  . LEFT HEART CATHETERIZATION WITH CORONARY ANGIOGRAM N/A 01/27/2013   Procedure: LEFT HEART CATHETERIZATION WITH CORONARY ANGIOGRAM;  Surgeon: Annita Brod  Angelena Form, MD;  Location: [place] CATH LAB;  Service: Cardiovascular;  Laterality: N/A;  . LUMBAR LAMINECTOMY/DECOMPRESSION MICRODISCECTOMY N/A 02/07/2015   Procedure: Lumbar three-Sacral one Decompression;  Surgeon: Kevan Ny Ditty, MD;  Location: [facility] NEURO ORS;  Service: Neurosurgery;  Laterality: N/A;  L3 to S1 Decompression  . MULTIPLE TOOTH EXTRACTIONS    . ORCHIECTOMY Right 09/01/2015   Procedure: RIGHT ORCHIECTOMY;  Surgeon: Kathie Rhodes, MD;  Location: [facility] ORS;  Service: Urology;  Laterality: Right;  . ROTATOR CUFF REPAIR Left   :   Current Facility-Administered Medications:  .  acetaminophen  (TYLENOL) tablet 650 mg, 650 mg, Oral, Q6H PRN, 650 mg at 07/01/16 1743 **OR** acetaminophen (TYLENOL) suppository 650 mg, 650 mg, Rectal, Q6H PRN, Smith, Rondell A, MD .  albuterol (PROVENTIL) (2.5 MG/3ML) 0.083% nebulizer solution 2.5 mg, 2.5 mg, Nebulization, Q6H PRN, Smith, Rondell A, MD .  atorvastatin (LIPITOR) tablet 40 mg, 40 mg, Oral, Daily, Smith, Rondell A, MD, 40 mg at 07/01/16 1104 .  clopidogrel (PLAVIX) tablet 75 mg, 75 mg, Oral, Daily, Smith, Rondell A, MD, 75 mg at 07/01/16 1104 .  docusate sodium (COLACE) capsule 100 mg, 100 mg, Oral, BID PRN, Smith, Rondell A, MD .  feeding supplement (ENSURE ENLIVE) (ENSURE ENLIVE) liquid 237 mL, 237 mL, Oral, BID BM, Patrecia Pour, Edwin, MD .  furosemide (LASIX) injection 40 mg, 40 mg, Intravenous, BID, Patrecia Pour, Christean Grief, MD, 40 mg at 07/01/16 2206 .  gabapentin (NEURONTIN) capsule 600 mg, 600 mg, Oral, TID, Tamala Julian, Rondell A, MD, 600 mg at 07/01/16 2135 .  HYDROcodone-acetaminophen (NORCO) 7.5-325 MG per tablet 1 tablet, 1 tablet, Oral, Q4H PRN, Smith, Rondell A, MD .  insulin aspart (novoLOG) injection 0-9 Units, 0-9 Units, Subcutaneous, TID WC, Smith, Rondell A, MD, 1 Units at 07/01/16 1213 .  insulin glargine (LANTUS) injection 20 Units, 20 Units, Subcutaneous, Daily, Fuller Plan A, MD, 20 Units at 07/01/16 1104 .  metoprolol succinate (TOPROL-XL) 24 hr tablet 100 mg, 100 mg, Oral, Daily, Smith, Rondell A, MD, 100 mg at 07/01/16 1104 .  nitroGLYCERIN (NITROSTAT) SL tablet 0.4 mg, 0.4 mg, Sublingual, Q5 min PRN, Smith, Rondell A, MD .  ondansetron (ZOFRAN) tablet 4 mg, 4 mg, Oral, Q6H PRN **OR** ondansetron (ZOFRAN) injection 4 mg, 4 mg, Intravenous, Q6H PRN, Smith, Rondell A, MD .  oxybutynin (DITROPAN XL) 24 hr tablet 15 mg, 15 mg, Oral, QHS, Smith, Rondell A, MD, 15 mg at 07/01/16 2134 .  oxymetazoline (AFRIN) 0.05 % nasal spray 1-2 spray, 1-2 spray, Each Nare, BID PRN, Smith, Rondell A, MD .  pantoprazole (PROTONIX) EC tablet 40 mg, 40  mg, Oral, Daily, Smith, Rondell A, MD, 40 mg at 07/01/16 1104 .  pramipexole (MIRAPEX) tablet 1 mg, 1 mg, Oral, QHS, Patrecia Pour, Edwin, MD, 1 mg at 07/02/16 0101 .  sodium chloride flush (NS) 0.9 % injection 10-40 mL, 10-40 mL, Intracatheter, PRN, Patrecia Pour, Edwin, MD, 20 mL at 07/02/16 0526 .  sodium chloride flush (NS) 0.9 % injection 3 mL, 3 mL, Intravenous, Q12H, Smith, Rondell A, MD, 3 mL at 07/01/16 1000 .  tamsulosin (FLOMAX) capsule 0.4 mg, 0.4 mg, Oral, QHS, Smith, Rondell A, MD, 0.4 mg at 07/01/16 2134 .  temazepam (RESTORIL) capsule 30 mg, 30 mg, Oral, QHS PRN, Tamala Julian, Rondell A, MD .  Trospium Chloride CP24 60 mg, 60 mg, Oral, Daily, Smith, Rondell A, MD .  warfarin (COUMADIN) tablet 7.5 mg, 7.5 mg, Oral, Once per day on Sun Tue Wed Thu Sat **AND**  warfarin (COUMADIN) tablet 10 mg, 10 mg, Oral, Once per day on Mon Fri, Smith, Rondell A, MD, 10 mg at 07/01/16 1734 .  Warfarin - Pharmacist Dosing Inpatient, , Does not apply, q1800, Norval Morton, MD  Facility-Administered Medications Ordered in Other Encounters:  .  testosterone cypionate (DEPOTESTOTERONE CYPIONATE) injection 200 mg, 200 mg, Intramuscular, Q28 days, [name], Alinda Sierras, MD, 200 mg at 12/26/11 0850 .  testosterone cypionate (DEPOTESTOTERONE CYPIONATE) injection 200 mg, 200 mg, Intramuscular, Q28 days, [name], Alinda Sierras, MD, 200 mg at 01/23/12 0908 .  testosterone cypionate (DEPOTESTOTERONE CYPIONATE) injection 200 mg, 200 mg, Intramuscular, Q28 days, [name], Alinda Sierras, MD, 200 mg at 02/25/12 0848 .  testosterone cypionate (DEPOTESTOTERONE CYPIONATE) injection 200 mg, 200 mg, Intramuscular, Q28 days, [name], Alinda Sierras, MD, 200 mg at 06/03/12 1437:  . atorvastatin  40 mg Oral Daily  . clopidogrel  75 mg Oral Daily  . feeding supplement (ENSURE ENLIVE)  237 mL Oral BID BM  . furosemide  40 mg Intravenous BID  . gabapentin  600 mg Oral TID  . insulin aspart  0-9 Units Subcutaneous TID WC  . insulin glargine  20  Units Subcutaneous Daily  . metoprolol succinate  100 mg Oral Daily  . oxybutynin  15 mg Oral QHS  . pantoprazole  40 mg Oral Daily  . pramipexole  1 mg Oral QHS  . sodium chloride flush  3 mL Intravenous Q12H  . tamsulosin  0.4 mg Oral QHS  . Trospium Chloride  60 mg Oral Daily  . warfarin  7.5 mg Oral Once per day on Sun Tue Wed Thu Sat   And  . warfarin  10 mg Oral Once per day on Mon Fri  . Warfarin - Pharmacist Dosing Inpatient   Does not apply q1800  :  Allergies  Allergen Reactions  . Ace Inhibitors Other (See Comments)    cough  . Codeine Nausea Only and Rash       . Penicillins Rash    Childhood allergy Has patient had a PCN reaction causing immediate rash, facial/tongue/throat swelling, SOB or lightheadedness with hypotension: Yes Has patient had a PCN reaction causing severe rash involving mucus membranes or skin necrosis: Yes Has patient had a PCN reaction that required hospitalization No Has patient had a PCN reaction occurring within the last 10 years: No If all of the above answers are "NO", then may proceed with Cephalosporin use.   :  Family History  Problem Relation Age of Onset  . Alzheimer's disease Mother   . Heart disease Mother   . Heart disease Father   . Migraines Father   . Ulcers Father   . Prostate cancer Brother   . Coronary artery disease Unknown        Male 1st degree relative <50  . Coronary artery disease Unknown        male 1st degree relative <60  . Heart disease Sister   . Obesity Sister        Morbid  . Arthritis Sister   . Heart disease Brother   . Arthritis Brother   . Sleep apnea Son   . Obesity Son   . Migraines Daughter   . Thyroid disease Daughter   :  Social History   Social History  . Marital status: Married    Spouse name: N/A  . Number of children: 3  . Years of education: N/A   Occupational History  . Retired Retired   Science writer History Main  Topics  . Smoking status: Former Smoker    Packs/day: 1.50     Years: 20.00    Types: Cigarettes    Quit date: 04/05/1978  . Smokeless tobacco: Never Used  . Alcohol use No  . Drug use: No  . Sexual activity: Not Currently   Other Topics Concern  . Not on file   Social History Narrative  . No narrative on file  :  Pertinent items are noted in HPI.  Exam: Patient Vitals for the past 24 hrs:  BP Temp Temp src Pulse Resp SpO2 Height Weight  07/02/16 0623 91/66 97.7 F (36.5 C) Oral 92 17 94 % - 225 lb (102.1 kg)  07/01/16 2154 111/66 98.8 F (37.1 C) Oral 84 16 95 % - -  07/01/16 1146 139/69 98.2 F (36.8 C) Oral 89 18 96 % - -  07/01/16 1100 127/66 - - 91 18 - - -  07/01/16 0654 - - - - - - 5\' 9"  (1.753 m) 230 lb (104.3 kg)  07/01/16 0648 123/60 98.7 F (37.1 C) - 91 - 95 % - -    As above    Recent Labs  07/01/16 0030 07/02/16 0508  WBC 4.9 5.0  HGB 9.9* 10.1*  HCT 30.6* 31.0*  PLT 127* 128*    Recent Labs  07/01/16 0030 07/02/16 0508  NA 137 140  K 3.5 2.8*  CL 102 104  CO2 24 23  GLUCOSE 111* 124*  BUN 11 8  CREATININE 1.16 1.02  CALCIUM 8.1* 7.7*    Blood smear review:  None  Pathology: None     Assessment and Plan:  Gary Yates is an 83 year old white male with recurrent diffuse large cell non-Hodgkin's lymphoma.  He is admitted for congestive heart failure. I don't think this is related to his chemotherapy. He'll he got 4 cycles of Adriamycin. This will not cause any kind of heart issues. Even if it did, it typically takes several years before one would see cardiac issues.  It is quite troublesome that the CT scan does show this left adrenal mass to be larger. As such, I have to believe that the chemotherapy that he's had has not been effective at all.  The PET scan will really help [place] out.  If we do see worsening disease, then I might consider radiation therapy to the left adrenal mass. There is no indication for surgery, particular since he is a very poor surgical candidate.  Hopefully, he will be able  to go home in a day or 2. His potassium is very low today.  There is not much that we need to do otherwise. I appreciate being able to see him in the hospital. I have known their family for 20 years.  Lattie Haw, MD  Darlyn Chamber 16:21

## 2016-07-02 NOTE — Progress Notes (Signed)
Initial Nutrition Assessment  DOCUMENTATION CODES:   Severe malnutrition in context of chronic illness  INTERVENTION:   -Glucerna Shake po TID, each supplement provides 220 kcal and 10 grams of protein -If po intake inadequate on follow-up, recommend liberalizing diet to Regular   NUTRITION DIAGNOSIS:   Malnutrition (Severe) related to chronic illness, cancer and cancer related treatments (CHF, COPD) as evidenced by percent weight loss, energy intake < or equal to 75% for > or equal to 1 month, mild depletion of body fat, mild depletion of muscle mass, moderate to severe fluid accumulation.  GOAL:   Patient will meet greater than or equal to 90% of their needs  MONITOR:   PO intake, Supplement acceptance, Labs, Weight trends  REASON FOR ASSESSMENT:   Malnutrition Screening Tool    ASSESSMENT:   83 yo male admitted with CHF.  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 He underwent a right orchiectomy and  systemic chemotherapy with R-CHOP (Nov 2017) with some radiation therapy to the inguinal area.Relapsed in April ad finished 2nd cycle of chemo on 06/20/2016. Pt now with mets to adrenal gland. Pt with additional hx of COPD, CAD, DM, GERD, HTN, HL   Recorded po intake 67% of meals on average. Ensure Enlive ordered BID. Pt had been drinking Ensure BID at home but began giving pt diarrhea after drinking. Pt requesting Glucerna shake instead.  Pt reports appetite has been very poor. Pt has no taste for anything. When he does have an appetite for something, as soon as he begins to eat, he loses his taste for it. Pt also reports "intolerances" to some foods (i.e. Bread is difficult to swallow, just gets larger and larger in his mouth as he chews). Pt has mostly been eating bites/sips for months.   Pt complains of diarrhea but no N/V.   Pt with significant wt loss over the past year. Pt weighed 287 pounds in October 2017 (21.6%  wt loss in <1 year). Pt with moderate edema and may be masking some of the weight loss as well.  Nutrition-Focused physical exam completed. Findings are mild fat depletion, mild muscle depletion, and moderate edema.   Labs: potassium 2.8 (L), magnesium 1.1 (L) Meds: lasix, ss novolog, lantus, KCl, magnesium sulfate  Diet Order:  Diet Carb Modified Fluid consistency: Thin; Room service appropriate? Yes  Skin:  Reviewed, no issues  Last BM:  07/01/16  Height:   Ht Readings from Last 1 Encounters:  07/01/16 5\' 9"  (1.753 m)    Weight:   Wt Readings from Last 1 Encounters:  07/02/16 225 lb (102.1 kg)    Ideal Body Weight:     BMI:  Body mass index is 33.23 kg/m.  Estimated Nutritional Needs:   Kcal:  2040-2400 kcals  Protein:  102-120 g  Fluid:  >/= 2 L  EDUCATION NEEDS:   No education needs identified at this time  [place], [place], LDN [phone] Pager  [phone] Weekend/On-Call Pager

## 2016-07-03 DIAGNOSIS — R531 Weakness: Secondary | ICD-10-CM | POA: Diagnosis not present

## 2016-07-03 DIAGNOSIS — I110 Hypertensive heart disease with heart failure: Secondary | ICD-10-CM | POA: Diagnosis not present

## 2016-07-03 DIAGNOSIS — R109 Unspecified abdominal pain: Secondary | ICD-10-CM | POA: Diagnosis not present

## 2016-07-03 DIAGNOSIS — I10 Essential (primary) hypertension: Secondary | ICD-10-CM

## 2016-07-03 DIAGNOSIS — C8589 Other specified types of non-Hodgkin lymphoma, extranodal and solid organ sites: Secondary | ICD-10-CM | POA: Diagnosis not present

## 2016-07-03 DIAGNOSIS — I480 Paroxysmal atrial fibrillation: Secondary | ICD-10-CM | POA: Diagnosis not present

## 2016-07-03 DIAGNOSIS — R600 Localized edema: Secondary | ICD-10-CM | POA: Diagnosis not present

## 2016-07-03 LAB — BASIC METABOLIC PANEL
Anion gap: 8 (ref 5–15)
BUN: 11 mg/dL (ref 6–20)
CALCIUM: 8.6 mg/dL — AB (ref 8.9–10.3)
CO2: 28 mmol/L (ref 22–32)
Chloride: 104 mmol/L (ref 101–111)
Creatinine, Ser: 1.01 mg/dL (ref 0.61–1.24)
GLUCOSE: 153 mg/dL — AB (ref 65–99)
POTASSIUM: 3.7 mmol/L (ref 3.5–5.1)
Sodium: 140 mmol/L (ref 135–145)

## 2016-07-03 LAB — PROTIME-INR
INR: 2.49
Prothrombin Time: 27.4 seconds — ABNORMAL HIGH (ref 11.4–15.2)

## 2016-07-03 LAB — CBC
HEMATOCRIT: 32.6 % — AB (ref 39.0–52.0)
HEMOGLOBIN: 10.5 g/dL — AB (ref 13.0–17.0)
MCH: 28.8 pg (ref 26.0–34.0)
MCHC: 32.2 g/dL (ref 30.0–36.0)
MCV: 89.6 fL (ref 78.0–100.0)
Platelets: 135 10*3/uL — ABNORMAL LOW (ref 150–400)
RBC: 3.64 MIL/uL — AB (ref 4.22–5.81)
RDW: 16.1 % — ABNORMAL HIGH (ref 11.5–15.5)
WBC: 5.1 10*3/uL (ref 4.0–10.5)

## 2016-07-03 LAB — MAGNESIUM: MAGNESIUM: 1.8 mg/dL (ref 1.7–2.4)

## 2016-07-03 LAB — GLUCOSE, CAPILLARY
Glucose-Capillary: 132 mg/dL — ABNORMAL HIGH (ref 65–99)
Glucose-Capillary: 172 mg/dL — ABNORMAL HIGH (ref 65–99)

## 2016-07-03 LAB — ALBUMIN: Albumin: 3 g/dL — ABNORMAL LOW (ref 3.5–5.0)

## 2016-07-03 MED ORDER — POTASSIUM CHLORIDE CRYS ER 20 MEQ PO TBCR: 20.0000 meq | EXTENDED_RELEASE_TABLET | Freq: Every day | ORAL | 0 refills | Status: DC

## 2016-07-03 MED ORDER — HEPARIN SOD (PORK) LOCK FLUSH 100 UNIT/ML IV SOLN
500.0000 [IU] | INTRAVENOUS | Status: AC | PRN
  Administered 2016-07-03: 500 [IU]

## 2016-07-03 MED ORDER — FUROSEMIDE 40 MG PO TABS: 40.0000 mg | ORAL_TABLET | Freq: Two times a day (BID) | ORAL | 0 refills | Status: DC

## 2016-07-03 NOTE — Care Management Note (Signed)
Case Management Note  Patient Details  Name: Gary Yates. MRN: [id number] Date of Birth: 01-19-1933  Subjective/Objective:    Admitted with Bilateral LE Edema               Action/Plan: Patient lives at home with spouse; PCP: [name], MD; has private insurance with [facility] with prescription drug coverage; [place] choice offered, pt chose [facility]; Nelson with [facility] called for arrangements.  Expected Discharge Date:  07/03/16               Expected Discharge Plan:  [place]  Discharge planning Services  CM Consult   Choice offered to:  Patient, Spouse, Adult Children  HH Arranged:  PT, OT [place] Agency:  [facility] (now [facility])  Status of Service:  In process, will continue to follow  Sherrilyn Rist [phone] 07/03/2016, 3:39 PM

## 2016-07-03 NOTE — Progress Notes (Signed)
ANTICOAGULATION CONSULT NOTE - Initial Consult  Pharmacy Consult for Coumadin Indication: atrial fibrillation  Allergies  Allergen Reactions  . Ace Inhibitors Other (See Comments)    cough  . Codeine Nausea Only and Rash       . Penicillins Rash    Childhood allergy Has patient had a PCN reaction causing immediate rash, facial/tongue/throat swelling, SOB or lightheadedness with hypotension: Yes Has patient had a PCN reaction causing severe rash involving mucus membranes or skin necrosis: Yes Has patient had a PCN reaction that required hospitalization No Has patient had a PCN reaction occurring within the last 10 years: No If all of the above answers are "NO", then may proceed with Cephalosporin use.     Patient Measurements: Height: 5\' 9"  (175.3 cm) Weight: 215 lb (97.5 kg) (bed) IBW/kg (Calculated) : 70.7  Vital Signs: Temp: 98.4 F (36.9 C) (06/27 0611) Temp Source: Oral (06/27 0611) BP: 98/51 (06/27 0611) Pulse Rate: 94 (06/27 0611)  Labs:  Recent Labs  07/01/16 0030 07/01/16 0038 07/02/16 0508 07/03/16 0413  HGB 9.9*  --  10.1* 10.5*  HCT 30.6*  --  31.0* 32.6*  PLT 127*  --  128* 135*  LABPROT  --  30.3* 25.6* 27.4*  INR  --  2.83 2.29 2.49  CREATININE 1.16  --  1.02 1.01    Estimated Creatinine Clearance: 66 mL/min (by C-G formula based on SCr of 1.01 mg/dL).    Assessment: 83 y.o. male admitted with LE swelling. He is on chronic coumadin for afib, INR 2.49 on home dose. CBC stable, No bleeding noted per chart.  LE doppler neg for DVT   PTA Coumadin dose: 7.5 mg daily except 10 mg MonFri Goal of Therapy:  INR 2-3 Heparin level 0.3-0.7 units/ml Monitor platelets by anticoagulation protocol: Yes   Plan:  Continue home Coumadin regimen 7.5 mg today Daily INR  Maryanna Shape, PharmD, BCPS  Clinical Pharmacist  Pager: [phone]   07/03/2016,10:38 AM

## 2016-07-03 NOTE — Evaluation (Signed)
Physical Therapy Evaluation Patient Details Name: Gary Yates. MRN: [id number] DOB: [birth year] Today's Date: 07/03/2016   History of Present Illness  Gary Yates. is a 83 y.o. 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  Clinical Impression  Pt admitted with above. Pt functioning at min guard for mobility and minA for ADLs.Spouse with severe dementia and significant confusion during session. Spoke at length with daughter Gary Yates on phone regarding the decreased safety with both her parents home alone. REcommend 24/7 assist from someone other than the spouse. Daughter agreed and reports one of the 3 of the children will provide 24/7 assist upon d/c.     Follow Up Recommendations Home health PT;Supervision/Assistance - 24 hour    Equipment Recommendations  None recommended by PT    Recommendations for Other Services       Precautions / Restrictions Precautions Precautions: Fall Precaution Comments: spouse with severe dementia Restrictions Weight Bearing Restrictions: No      Mobility  Bed Mobility               General bed mobility comments: pt up in chair upon PT arrival  Transfers Overall transfer level: Needs assistance Equipment used: Rolling walker (2 wheeled) Transfers: Sit to/from Stand Sit to Stand: Min guard         General transfer comment: v/c's for hand placement  Ambulation/Gait Ambulation/Gait assistance: Min guard Ambulation Distance (Feet): 120 Feet Assistive device: Rolling walker (2 wheeled) Gait Pattern/deviations: Step-through pattern;Decreased stride length (decreased step height) Gait velocity: slow Gait  velocity interpretation: Below normal speed for age/gender General Gait Details: pt with decreased step height but no overt episode of LOB, 1 standing rest break  Stairs Stairs: Yes Stairs assistance: Min guard Stair Management: One rail Right Number of Stairs: 2 General stair comments: v/'cs for sideways technique  Wheelchair Mobility    Modified Rankin (Stroke Patients Only)       Balance Overall balance assessment: Needs assistance Sitting-balance support: Feet supported;No upper extremity supported Sitting balance-Leahy Scale: Fair     Standing balance support: Bilateral upper extremity supported Standing balance-Leahy Scale: Fair Standing balance comment: requires RW for safe standing                             Pertinent Vitals/Pain Pain Assessment: No/denies pain    Home Living Family/patient expects to be discharged to:: Private residence Living Arrangements: Spouse/significant other Available Help at Discharge: Family;Available 24 hours/day Type of Home: House Home Access: Stairs to enter Entrance Stairs-Rails: Right Entrance Stairs-Number of Steps: 2 Home Layout: Able to live on main level with bedroom/bathroom;Two level Home Equipment: Walker - 4 wheels;Shower seat - built in;Grab bars - toilet;Grab bars - tub/shower;Hand held shower head;Bedside commode      Prior Function Level of Independence: Needs assistance   Gait / Transfers Assistance Needed: used rollator  ADL's / Homemaking Assistance Needed: dtrs and wife assisted with bathing/dressing, didn't cook, at out        [employer]        Extremity/Trunk Assessment   Upper Extremity Assessment Upper Extremity Assessment: Generalized weakness    Lower Extremity Assessment Lower Extremity Assessment: Generalized weakness  Cervical / Trunk Assessment Cervical / Trunk Assessment: Normal  Communication   Communication: HOH  Cognition Arousal/Alertness:  Awake/alert Behavior During Therapy: WFL for tasks assessed/performed Overall Cognitive Status: Within Functional Limits for tasks assessed                                 General Comments: spouses cognition intact however spouse with severe dementia and very confused      General Comments General comments (skin integrity, edema, etc.): bilat LE edema    Exercises     Assessment/Plan    PT Assessment Patient needs continued PT services  PT Problem List Decreased strength;Decreased activity tolerance;Decreased range of motion;Decreased balance;Decreased mobility;Decreased coordination;Decreased cognition;Decreased knowledge of use of DME       PT Treatment Interventions DME instruction;Gait training;Stair training;Functional mobility training;Therapeutic activities;Therapeutic exercise;Balance training    PT Goals (Current goals can be found in the Care Plan section)  Acute Rehab PT Goals Patient Stated Goal: home today PT Goal Formulation: With patient Time For Goal Achievement: 07/10/16 Potential to Achieve Goals: Good    Frequency Min 3X/week   Barriers to discharge Decreased caregiver support wife with severe dementia, spoke with dtr Gary Yates who reports one of the 3 children will stay with them 24/7    Co-evaluation               AM-PAC PT "6 Clicks" Daily Activity  Outcome Measure Difficulty turning over in bed (including adjusting bedclothes, sheets and blankets)?: A Little Difficulty moving from lying on back to sitting on the side of the bed? : A Little Difficulty sitting down on and standing up from a chair with arms (e.g., wheelchair, bedside commode, etc,.)?: A Little Help needed moving to and from a bed to chair (including a wheelchair)?: A Little Help needed walking in hospital room?: A Little Help needed climbing 3-5 steps with a railing? : A Little 6 Click Score: 18    End of Session Equipment Utilized During Treatment: Gait  belt Activity Tolerance: Patient tolerated treatment well Patient left: in chair Nurse Communication: Mobility status PT Visit Diagnosis: Muscle weakness (generalized) (M62.81);Difficulty in walking, not elsewhere classified (R26.2)    Time: 1314-3888 PT Time Calculation (min) (ACUTE ONLY): 43 min   Charges:   PT Evaluation $PT Eval Moderate Complexity: 1 Procedure PT Treatments $Gait Training: 8-22 mins $Therapeutic Activity: 8-22 mins   PT G Codes:        Kittie Plater, PT, DPT Pager #: [phone] Office #: [phone]   [place] 07/03/2016, 3:46 PM

## 2016-07-03 NOTE — Discharge Summary (Signed)
Physician Discharge Summary  Gary Yates.  DTO:[id number]  DOB: 02-19-1933  DOA: 06/30/2016 PCP: Gary Post, MD  Admit date: 06/30/2016 Discharge date: 07/03/2016  Admitted From: Home  Disposition:  Home   Recommendations for Outpatient Follow-up:  1. Follow up with PCP in 1-2 weeks 2. Please obtain BMP/CBC in one week 3. Please follow up on the following pending results:  Home Health: PT/OT   Discharge Condition: Stable  CODE STATUS: FULL  Diet recommendation: Heart Healthy   Brief/Interim Summary: Gary Yates is a 83 year old male with medical history of diffuse lymphoma with metastasis to adrenal gland on chemotherapy, diastolic dysfunction, diabetes type 2, COPD and PAF on chronic anticoagulation report that he has progressive worsening lower extremity edema and recent falls. Patient was admitted due to severe lower extremity edema and was started on diuretics. Patient had good diuresis, loosing 15 lb during admission. Patient edema significantly improve, and tolerated well TED hose. Patient was evaluated by physical therapy and recommended home health physical therapy. Patient will continue oral Lasix therapy and will follow up with PCP.   Subjective: Patient seen and examined today at discharge. Leg edema significantly improve, skin slightly tender to touch. Patient denies chest pain, shortness of breath, palpitation, dizziness and cough. No acute events overnight. Patient is having good urinary output, remains afebrile and tolerating diet.  Discharge Diagnoses/Hospital Course:  Bilateral lower extremity edema - multifactorial from diastolic dysfunction and lymphedema Treated with IV lasix and TED hose with great improvement  Continue Lasix 40 mg BID  Low salt intake, water intake up to 1.5-1.8 L/day  Daily weights  Follow up with PCP    Abdominal pain - 2/2 progression of underlying malignancy  No indication for surgery, poor surgical candidate  Follow up with  oncology as outpatient   Diffuse non-Hodgkin lymphoma with metastases to adrenal gland Follow up with oncology as outpatient   Type 2 diabetes mellitus CBG's were stable during hospital stay.  No changes in medication were made Follow up with PCP   PAF HR well controlled, had an episode of Vtach in view of electrolyte imbalance (hypokalemia)  Continue metoprolol  Follow up with Cardiology  Check electrolytes in 1 week  Hypertension BP stable during hospital stay  No changes in medication were made   All other chronic medical condition were stable during the hospitalization.  Patient was seen by physical therapy, recommended home health physical therapy On the day of the discharge the patient's vitals were stable, and no other acute medical condition were reported by patient. Patient was felt safe to be discharge to home.   Discharge Instructions  You were cared for by a hospitalist during your hospital stay. If you have any questions about your discharge medications or the care you received while you were in the hospital after you are discharged, you can call the unit and asked to speak with the hospitalist on call if the hospitalist that took care of you is not available. Once you are discharged, your primary care physician will handle any further medical issues. Please note that NO REFILLS for any discharge medications will be authorized once you are discharged, as it is imperative that you return to your primary care physician (or establish a relationship with a primary care physician if you do not have one) for your aftercare needs so that they can reassess your need for medications and monitor your lab values.   Allergies as of 07/03/2016      Reactions  Ace Inhibitors Other (See Comments)   cough   Codeine Nausea Only, Rash      Penicillins Rash   Childhood allergy Has patient had a PCN reaction causing immediate rash, facial/tongue/throat swelling, SOB or lightheadedness  with hypotension: Yes Has patient had a PCN reaction causing severe rash involving mucus membranes or skin necrosis: Yes Has patient had a PCN reaction that required hospitalization No Has patient had a PCN reaction occurring within the last 10 years: No If all of the above answers are "NO", then may proceed with Cephalosporin use.      Medication List    TAKE these medications   ACCU-CHEK AVIVA PLUS test strip Generic drug:  glucose blood TEST 3 TIMES A DAY What changed:  See the new instructions.   acetaminophen 500 MG tablet Commonly known as:  TYLENOL Take 1,000 mg by mouth every 6 (six) hours as needed (for pain/fever/headaches.).   atorvastatin 40 MG tablet Commonly known as:  LIPITOR TAKE 1 TABLET BY MOUTH DAILY   CENTRUM SILVER 50+MEN Tabs Take 1 tablet by mouth daily.   clopidogrel 75 MG tablet Commonly known as:  PLAVIX TAKE 1 TABLET BY MOUTH DAILY   docusate sodium 50 MG capsule Commonly known as:  COLACE Take 1 capsule (50 mg total) by mouth 2 (two) times daily as needed (for constipation).   furosemide 40 MG tablet Commonly known as:  LASIX Take 1 tablet (40 mg total) by mouth 2 (two) times daily. What changed:  See the new instructions.   gabapentin 300 MG capsule Commonly known as:  NEURONTIN Take 2 capsules (600 mg total) by mouth 3 (three) times daily.   HYDROcodone-acetaminophen 7.5-325 MG tablet Commonly known as:  NORCO Take 1 tablet by mouth every 4 (four) hours as needed for moderate pain.   insulin lispro 100 UNIT/ML injection Commonly known as:  HUMALOG Inject 4 Units into the skin daily with breakfast.   metFORMIN 500 MG tablet Commonly known as:  GLUCOPHAGE TAKE 1 TABLET BY MOUTH TWICE A DAY WITH A MEAL   metoprolol succinate 100 MG 24 hr tablet Commonly known as:  TOPROL-XL TAKE 1 TABLET EVERY DAY IMMEDIATELY FOLLOWING A MEAL   nitroGLYCERIN 0.4 MG SL tablet Commonly known as:  NITROSTAT Place 1 tablet (0.4 mg total) under the  tongue every 5 (five) minutes as needed. Chest pain   ondansetron 4 MG tablet Commonly known as:  ZOFRAN Take 1 tablet (4 mg total) by mouth every 8 (eight) hours as needed for nausea or vomiting.   oxybutynin 15 MG 24 hr tablet Commonly known as:  DITROPAN XL Take 15 mg by mouth at bedtime.   oxymetazoline 0.05 % nasal spray Commonly known as:  AFRIN Place 1-2 sprays into both nostrils 2 (two) times daily as needed for congestion.   pantoprazole 40 MG tablet Commonly known as:  PROTONIX TAKE 1 TABLET BY MOUTH EVERY DAY   potassium chloride SA 20 MEQ tablet Commonly known as:  K-DUR,KLOR-CON Take 1 tablet (20 mEq total) by mouth daily. Start taking on:  07/04/2016   pramipexole 1.5 MG tablet Commonly known as:  MIRAPEX MAY TAKE 1 TO 1 AND 1/2 TABLETS BY MOUTH AT BEDTIME   tamsulosin 0.4 MG Caps capsule Commonly known as:  FLOMAX Take 0.4 mg by mouth at bedtime.   temazepam 30 MG capsule Commonly known as:  RESTORIL Take 1 capsule (30 mg total) by mouth at bedtime as needed for sleep.   TOUJEO SOLOSTAR 300 UNIT/ML Sopn  Generic drug:  Insulin Glargine INJECT 20 UNITS INTO THE SKIN DAILY AFTER BREAKFAST.   Trospium Chloride 60 MG Cp24 Take 60 mg by mouth daily.   vitamin B-12 1000 MCG tablet Commonly known as:  CYANOCOBALAMIN Take 1,000 mcg by mouth daily.   warfarin 5 MG tablet Commonly known as:  COUMADIN Take as directed by anticoagulation clinic What changed:  how much to take  how to take this  when to take this  additional instructions       Allergies  Allergen Reactions  . Ace Inhibitors Other (See Comments)    cough  . Codeine Nausea Only and Rash       . Penicillins Rash    Childhood allergy Has patient had a PCN reaction causing immediate rash, facial/tongue/throat swelling, SOB or lightheadedness with hypotension: Yes Has patient had a PCN reaction causing severe rash involving mucus membranes or skin necrosis: Yes Has patient had a PCN  reaction that required hospitalization No Has patient had a PCN reaction occurring within the last 10 years: No If all of the above answers are "NO", then may proceed with Cephalosporin use.     Consultations:  Oncology. Dr Marin Olp    Procedures/Studies: Ct Abdomen Pelvis W Contrast  Result Date: 07/01/2016 CLINICAL DATA:  Progressive bilateral lower extremity swelling. Non-Hodgkin's lymphoma with adrenal involvement. Previous appendectomy and cholecystectomy. EXAM: CT ABDOMEN AND PELVIS WITH CONTRAST TECHNIQUE: Multidetector CT imaging of the abdomen and pelvis was performed using the standard protocol following bolus administration of intravenous contrast. CONTRAST:  178mL ISOVUE-300 IOPAMIDOL (ISOVUE-300) INJECTION 61% COMPARISON:  PET-CT dated 04/22/2016. FINDINGS: Lower chest: Small amount of linear atelectasis and scarring at the lung bases. Hepatobiliary: Cholecystectomy clips.  Unremarkable liver. Pancreas: Unremarkable. No pancreatic ductal dilatation or surrounding inflammatory changes. Spleen: Normal in size without focal abnormality. Adrenals/Urinary Tract: The previously demonstrated 4.5 x 3.0 cm left adrenal mass currently measures 5.4 x 4.4 cm on image number 26 of series 3. Stable bilateral renal cysts. The current images demonstrate a 4 mm nodular density in the posterior aspect of the largest cyst on the right. This is seen on image 41 of series 3. Distended urinary bladder extending into the upper pelvis. Interval minimal dilatation of both renal collecting systems and ureters. No urinary tract calculi. Stomach/Bowel: Normal appearing stomach, small bowel and colon. Surgically absent appendix. Vascular/Lymphatic: Atheromatous arterial calcifications without aneurysm. These include the coronary arteries. No enlarged lymph nodes. Reproductive: Moderate to markedly enlarged prostate gland protruding into the base of the urinary bladder. Other: Small left inguinal hernia containing fat.  Small umbilical and paraumbilical hernias containing fat. Musculoskeletal: Lumbar and lower thoracic spine degenerative changes. Mild levoconvex thoracolumbar scoliosis. Lumbar spine laminectomy defects. IMPRESSION: 1. Increased size of the left adrenal lymphomatous mass, currently measuring 5.4 x 4.4 cm. 2. Interval 4 mm nodular opacity in the posterior aspect of the largest right renal cyst. This most likely represents a small area of tumefactive debris. A small mass is less likely. This can be re-evaluated on follow-up imaging. 3. Small left inguinal, umbilical and paraumbilical hernias containing fat. 4. Calcified coronary artery atherosclerosis. 5. Moderately to markedly enlarged prostate gland with interval distention of the urinary bladder and associated minimal bilateral hydronephrosis. Electronically Signed   By: Claudie Revering M.D.   On: 07/01/2016 20:14    Discharge Exam: Vitals:   07/03/16 0611 07/03/16 1200  BP: (!) 98/51 (!) 93/55  Pulse: 94 (!) 107  Resp: 18 18  Temp: 98.4 F (36.9 C)  97.5 F (36.4 C)   Vitals:   07/02/16 1340 07/02/16 1955 07/03/16 0611 07/03/16 1200  BP: 110/61 (!) 101/54 (!) 98/51 (!) 93/55  Pulse: 94 88 94 (!) 107  Resp: 18 19 18 18   Temp: 98 F (36.7 C) 97.8 F (36.6 C) 98.4 F (36.9 C) 97.5 F (36.4 C)  TempSrc: Oral Oral Oral Oral  SpO2: 95% 96% 92% 96%  Weight:   97.5 kg (215 lb)   Height:        General: Pt is alert, awake, not in acute distress Cardiovascular: RRR, S1/S2 +, no rubs, no gallops Respiratory: CTA bilaterally, no wheezing, no rhonchi Abdominal: Soft, NT, ND, bowel sounds + Extremities: LE trace edema in Left, 1 + in right, no cyanosis   The results of significant diagnostics from this hospitalization (including imaging, microbiology, ancillary and laboratory) are listed below for reference.     Microbiology: No results found for this or any previous visit (from the past 240 hour(s)).   Labs: BNP (last 3 results) No  results for input(s): BNP in the last 8760 hours. Basic Metabolic Panel:  Recent Labs Lab 07/01/16 0030 07/02/16 0508 07/03/16 0413  NA 137 140 140  K 3.5 2.8* 3.7  CL 102 104 104  CO2 24 23 28   GLUCOSE 111* 124* 153*  BUN 11 8 11   CREATININE 1.16 1.02 1.01  CALCIUM 8.1* 7.7* 8.6*  MG  --  1.1* 1.8   Liver Function Tests:  Recent Labs Lab 07/03/16 0402  ALBUMIN 3.0*   No results for input(s): LIPASE, AMYLASE in the last 168 hours. No results for input(s): AMMONIA in the last 168 hours. CBC:  Recent Labs Lab 07/01/16 0030 07/02/16 0508 07/03/16 0413  WBC 4.9 5.0 5.1  NEUTROABS 1.7  --   --   HGB 9.9* 10.1* 10.5*  HCT 30.6* 31.0* 32.6*  MCV 88.7 88.3 89.6  PLT 127* 128* 135*   Cardiac Enzymes: No results for input(s): CKTOTAL, CKMB, CKMBINDEX, TROPONINI in the last 168 hours. BNP: Invalid input(s): POCBNP CBG:  Recent Labs Lab 07/02/16 1137 07/02/16 1653 07/02/16 2114 07/03/16 0740 07/03/16 1126  GLUCAP 141* 117* 127* 132* 172*    Anemia work up No results for input(s): VITAMINB12, FOLATE, FERRITIN, TIBC, IRON, RETICCTPCT in the last 72 hours. Urinalysis    Component Value Date/Time   COLORURINE YELLOW 01/21/2016 [place] 01/21/2016 1613   LABSPEC 1.011 01/21/2016 1613   PHURINE 7.0 01/21/2016 1613   GLUCOSEU 50 (A) 01/21/2016 1613   HGBUR NEGATIVE 01/21/2016 1613   HGBUR trace-intact 12/29/2006 0827   BILIRUBINUR NEGATIVE 01/21/2016 1613   KETONESUR NEGATIVE 01/21/2016 1613   PROTEINUR NEGATIVE 01/21/2016 1613   UROBILINOGEN 0.2 12/29/2006 0827   NITRITE NEGATIVE 01/21/2016 1613   LEUKOCYTESUR SMALL (A) 01/21/2016 1613   Sepsis Labs Invalid input(s): PROCALCITONIN,  WBC,  LACTICIDVEN Microbiology No results found for this or any previous visit (from the past 240 hour(s)).   Time coordinating discharge: 35 minutes  SIGNED:  Chipper Oman, MD  [facility] 07/03/2016, 1:44 PM  Pager please text page via   [email or web address] Password [email or web address]

## 2016-07-04 ENCOUNTER — Ambulatory Visit: Payer: Medicare Other

## 2016-07-04 ENCOUNTER — Other Ambulatory Visit: Payer: Medicare Other

## 2016-07-04 ENCOUNTER — Ambulatory Visit: Payer: Medicare Other | Admitting: Hematology & Oncology

## 2016-07-05 ENCOUNTER — Telehealth: Payer: Self-pay | Admitting: *Deleted

## 2016-07-05 ENCOUNTER — Ambulatory Visit: Payer: Medicare Other

## 2016-07-05 ENCOUNTER — Telehealth: Payer: Self-pay | Admitting: Family Medicine

## 2016-07-05 NOTE — Telephone Encounter (Signed)
Verbal orders given  

## 2016-07-05 NOTE — Telephone Encounter (Signed)
Melissa with [facility] is calling to see if it is okay to see pt on Saturday due to him being at the hospital with his wife and she just got out of the hospital today.

## 2016-07-05 NOTE — Telephone Encounter (Signed)
D/C 07/03/16  DX: BLE edema  Transition Care Management Follow-up Telephone Call How have you been since you were released from the hospital? "I've been doing good. I feel much better and my legs are much better"    Do you understand why you were in the hospital? "Yes, my legs were swelling really bad"    Do you understand the discharge instrcutions? "Yes, they want me to rest, keep my feel elevated, and wear compression stockings. I'm also taking potassium now"  Items Reviewed:  Medications reviewed: Yes, patient started on potassium; educated patient on purpose, actions, and side effects; instructed on medication schedule; patient voiced understanding   Allergies reviewed: Yes, no changes  Dietary changes reviewed: Yes, reviewed heart health diet choices Referrals reviewed: n/a  Functional Questionnaire:   Activities of Daily Living (ADLs):   He states they are independent in the following: feeding, continence, toileting and dressing States they require assistance with the following: ambulation, bathing and hygiene and grooming   Any transportation issues/concerns?: No, patient has spouse and family that is able to assist with transportation needs.    Any patient concerns? Patient is concerned regarding quantity of different medications that he is taking; wants to discuss with MD about cutting some out    Confirmed importance and date/time of follow-up visits scheduled: educated on importance of follow up appointments, understanding voiced. Pt to see PCP 07/09/16 at 79  Confirmed with patient if condition begins to worsen call PCP or go to the ER.  Patient was given the Call-a-Nurse line [phone]: Educated on s/s to report to ems vs pcp triage; understanding voiced.

## 2016-07-05 NOTE — Telephone Encounter (Signed)
OK 

## 2016-07-09 ENCOUNTER — Encounter: Payer: Self-pay | Admitting: Family Medicine

## 2016-07-09 ENCOUNTER — Ambulatory Visit (INDEPENDENT_AMBULATORY_CARE_PROVIDER_SITE_OTHER): Payer: Medicare Other | Admitting: Family Medicine

## 2016-07-09 VITALS — BP 98/52 | HR 102 | Temp 98.4°F | Wt 224.1 lb

## 2016-07-09 DIAGNOSIS — E1140 Type 2 diabetes mellitus with diabetic neuropathy, unspecified: Secondary | ICD-10-CM

## 2016-07-09 DIAGNOSIS — E119 Type 2 diabetes mellitus without complications: Secondary | ICD-10-CM | POA: Diagnosis not present

## 2016-07-09 DIAGNOSIS — Z9181 History of falling: Secondary | ICD-10-CM

## 2016-07-09 DIAGNOSIS — E1165 Type 2 diabetes mellitus with hyperglycemia: Secondary | ICD-10-CM

## 2016-07-09 DIAGNOSIS — R600 Localized edema: Secondary | ICD-10-CM

## 2016-07-09 DIAGNOSIS — I10 Essential (primary) hypertension: Secondary | ICD-10-CM

## 2016-07-09 LAB — CBC WITH DIFFERENTIAL/PLATELET
BASOS ABS: 0 10*3/uL (ref 0.0–0.1)
BASOS PCT: 0.6 % (ref 0.0–3.0)
EOS ABS: 0.1 10*3/uL (ref 0.0–0.7)
Eosinophils Relative: 0.9 % (ref 0.0–5.0)
HCT: 33.4 % — ABNORMAL LOW (ref 39.0–52.0)
HEMOGLOBIN: 11.6 g/dL — AB (ref 13.0–17.0)
Lymphocytes Relative: 32.7 % (ref 12.0–46.0)
Lymphs Abs: 2.3 10*3/uL (ref 0.7–4.0)
MCHC: 34.6 g/dL (ref 30.0–36.0)
MCV: 86.9 fl (ref 78.0–100.0)
MONO ABS: 0.9 10*3/uL (ref 0.1–1.0)
Monocytes Relative: 12.1 % — ABNORMAL HIGH (ref 3.0–12.0)
NEUTROS ABS: 3.8 10*3/uL (ref 1.4–7.7)
Neutrophils Relative %: 53.7 % (ref 43.0–77.0)
PLATELETS: 171 10*3/uL (ref 150.0–400.0)
RBC: 3.84 Mil/uL — ABNORMAL LOW (ref 4.22–5.81)
RDW: 16.2 % — AB (ref 11.5–15.5)
WBC: 7.2 10*3/uL (ref 4.0–10.5)

## 2016-07-09 LAB — BASIC METABOLIC PANEL
BUN: 21 mg/dL (ref 6–23)
CALCIUM: 8.7 mg/dL (ref 8.4–10.5)
CHLORIDE: 99 meq/L (ref 96–112)
CO2: 29 mEq/L (ref 19–32)
CREATININE: 1.15 mg/dL (ref 0.40–1.50)
GFR: 64.8 mL/min (ref >60.00)
Glucose, Bld: 143 mg/dL — ABNORMAL HIGH (ref 70–99)
Potassium: 3.5 mEq/L (ref 3.5–5.1)
Sodium: 138 mEq/L (ref 135–145)

## 2016-07-09 NOTE — Patient Instructions (Signed)
Try to weight each morning Be in touch if weight increases more than 3 pounds in one day or 5 pounds in one week. Continue with compression hose.

## 2016-07-09 NOTE — Progress Notes (Signed)
Subjective:     Patient ID: Gary Yates., male   DOB: Dec 14, 1933, 83 y.o.   MRN: [id number]  HPI Patient seen for hospital follow-up.  He has multiple chronic problems including history of obesity, atrial fibrillation, hypertension, CAD, COPD, GERD, type 2 diabetes, peripheral neuropathy, non-Hodgkin's lymphoma, chronic kidney disease, restless leg syndrome, chronic insomnia, dyslipidemia. He was seen here late June with some bilateral leg edema. He had been taking less Lasix and prescribed for concerns that he was having to get up and urinate more often. We had advised prescription hose but he ended up going to the hospital couple days later.  He diuresed with IV Lasix and was discharged on his prehospital regimen of Lasix 40 mg twice daily. He had slightly low potassium during hospitalization. Patient has gotten TED hose in the meantime and is wearing these consistently. Denies any orthopnea or dyspnea at rest.  Blood sugars apparently were stable during hospital stay. Most recent A1c 6.5%. No recent hypoglycemia. Patient has been losing some weight probably related to his lymphoma. He states he would like to come off all diabetes medications eventually if he gets below his target weight of less than 200.  Denies any recent chest pains.  Past Medical History:  Diagnosis Date  . Anxiety   . Atrial fibrillation ([place])   . COPD (chronic obstructive pulmonary disease) ([place])    pt. denies  . Coronary artery disease    a. h/o Overlapping stents RCA;  b. 06/2011 Cath: patent stents, nonobs dzs, NL EF.  . Diabetic peripheral neuropathy ([place])   . Diffuse non-Hodgkin's lymphoma of testis ([place]) 09/28/2015  . DM (diabetes mellitus) ([place])    Type 2, peripheral neuropathy.  . Dyspnea    with exertion  . Dysrhythmia   . GERD (gastroesophageal reflux disease)   . Headache   . History of bronchitis   . History of kidney stones   . Hyperlipidemia   . Hypertension   . Low testosterone   .  Nephrolithiasis   . Osteoarthritis    shoulder  . Restless leg   . SVT (supraventricular tachycardia) ([place])   . Urinary frequency   . Wears partial dentures    upper and lower   Past Surgical History:  Procedure Laterality Date  . APPENDECTOMY    . [place]  . CARDIAC CATHETERIZATION  01/2013  . CATARACT EXTRACTION, BILATERAL    . CHOLECYSTECTOMY    . COLONOSCOPY    . CORONARY ANGIOPLASTY  2004  . EYE SURGERY Bilateral    cataracts  . IR GENERIC HISTORICAL  10/05/2015   IR US GUIDE VASC ACCESS RIGHT 10/05/2015 Marybelle Killings, MD [facility]INTER[facility]  . IR GENERIC HISTORICAL  10/05/2015   IR FLUORO GUIDE PORT INSERTION RIGHT 10/05/2015 Marybelle Killings, MD [facility]  . LEFT HEART CATHETERIZATION WITH CORONARY ANGIOGRAM N/A 06/18/2011   Procedure: LEFT HEART CATHETERIZATION WITH CORONARY ANGIOGRAM;  Surgeon: Peter M Martinique, MD;  Location: [facility] CATH LAB;  Service: Cardiovascular;  Laterality: N/A;  . LEFT HEART CATHETERIZATION WITH CORONARY ANGIOGRAM N/A 01/27/2013   Procedure: LEFT HEART CATHETERIZATION WITH CORONARY ANGIOGRAM;  Surgeon: Burnell Blanks, MD;  Location: [facility] CATH LAB;  Service: Cardiovascular;  Laterality: N/A;  . LUMBAR LAMINECTOMY/DECOMPRESSION MICRODISCECTOMY N/A 02/07/2015   Procedure: Lumbar three-Sacral one Decompression;  Surgeon: Kevan Ny Ditty, MD;  Location: [facility] NEURO ORS;  Service: Neurosurgery;  Laterality: N/A;  L3 to S1 Decompression  . MULTIPLE TOOTH EXTRACTIONS    .  ORCHIECTOMY Right 09/01/2015   Procedure: RIGHT ORCHIECTOMY;  Surgeon: Kathie Rhodes, MD;  Location: [facility] ORS;  Service: Urology;  Laterality: Right;  . ROTATOR CUFF REPAIR Left     reports that he quit smoking about 38 years ago. His smoking use included Cigarettes. He has a 30.00 pack-year smoking history. He has never used smokeless tobacco. He reports that he does not drink alcohol or use drugs. family history includes Alzheimer's disease in his mother; Arthritis in his brother and  sister; Heart disease in his brother, father, mother, and sister; Migraines in his daughter and father; Obesity in his sister and son; Prostate cancer in his brother; Sleep apnea in his son; Thyroid disease in his daughter; Ulcers in his father. Allergies  Allergen Reactions  . Ace Inhibitors Other (See Comments)    cough  . Codeine Nausea Only and Rash       . Penicillins Rash    Childhood allergy Has patient had a PCN reaction causing immediate rash, facial/tongue/throat swelling, SOB or lightheadedness with hypotension: Yes Has patient had a PCN reaction causing severe rash involving mucus membranes or skin necrosis: Yes Has patient had a PCN reaction that required hospitalization No Has patient had a PCN reaction occurring within the last 10 years: No If all of the above answers are "NO", then may proceed with Cephalosporin use.      Review of Systems  Constitutional: Negative for fatigue and unexpected weight change.  HENT: Negative for trouble swallowing.   Eyes: Negative for visual disturbance.  Respiratory: Negative for cough, chest tightness and shortness of breath.   Cardiovascular: Negative for chest pain, palpitations and leg swelling.  Gastrointestinal: Negative for abdominal pain, nausea and vomiting.  Endocrine: Negative for polydipsia and polyuria.  Genitourinary: Negative for dysuria.  Neurological: Negative for dizziness, syncope, weakness, light-headedness and headaches.       Objective:   Physical Exam  Constitutional: He is oriented to person, place, and time. He appears well-developed and well-nourished.  Cardiovascular: Normal rate.   Pulmonary/Chest: Effort normal and breath sounds normal. No respiratory distress. He has no wheezes. He has no rales.  Musculoskeletal:  Has compression hose on today and considerably less peripheral edema. His hose were not removed  Neurological: He is alert and oriented to person, place, and time.  Psychiatric: He has a  normal mood and affect. His behavior is normal.       Assessment:     #1 bilateral leg edema with recent exacerbation prompting hospitalization. Poor compliance with Lasix in the past  #2 type 2 diabetes with improving control  #3 hypertension stable and at goal    Plan:     -Continue with support hose daily -Recommend daily weights and be in touch if weight goes up more than 3 pounds in 1 day or 5 pounds in one week -Try to keep sodium intake less than 2000 mg daily -Continue current diabetes regimen -Recheck in 2 months. Repeat A1c then. Try tapering back medications then if diabetes control improving further at that time  [name] MD [facility]

## 2016-07-11 ENCOUNTER — Ambulatory Visit: Payer: Medicare Other

## 2016-07-11 ENCOUNTER — Other Ambulatory Visit: Payer: Medicare Other

## 2016-07-15 ENCOUNTER — Telehealth: Payer: Self-pay | Admitting: Family Medicine

## 2016-07-15 NOTE — Telephone Encounter (Signed)
Gary Yates with [facility] called to notify that in home Physical Therapy will start 07/17/16.

## 2016-07-17 ENCOUNTER — Telehealth: Payer: Self-pay | Admitting: Family Medicine

## 2016-07-17 NOTE — Telephone Encounter (Signed)
Patient is aware 

## 2016-07-17 NOTE — Telephone Encounter (Signed)
Pt saw a foot massager type machine on tv that has a remote that intensifies the sensations in the feet. Supposed to increase blood flow to the muscles and nerves. Pt would like to know if Dr Elease Hashimoto thinks this would be a good idea for pt to purchase. Pt states his legs continue to swell, but they are better.  Pt keeps his feet elevated several hours a day.  Pt states this machine is on sale for $189 and is this a good investment.

## 2016-07-17 NOTE — Telephone Encounter (Signed)
I have no way to assess whether this would be beneficial or not-not aware of any studies

## 2016-07-18 ENCOUNTER — Ambulatory Visit: Payer: Medicare Other

## 2016-07-18 ENCOUNTER — Other Ambulatory Visit: Payer: Medicare Other

## 2016-07-19 ENCOUNTER — Encounter (HOSPITAL_COMMUNITY)
Admission: RE | Admit: 2016-07-19 | Discharge: 2016-07-19 | Disposition: A | Discharge status: Home / Self Care | Payer: Medicare Other | Source: Ambulatory Visit | Attending: Hematology & Oncology | Admitting: Hematology & Oncology

## 2016-07-19 DIAGNOSIS — C8589 Other specified types of non-Hodgkin lymphoma, extranodal and solid organ sites: Secondary | ICD-10-CM

## 2016-07-19 LAB — GLUCOSE, CAPILLARY: GLUCOSE-CAPILLARY: 93 mg/dL (ref 65–99)

## 2016-07-19 MED ORDER — FLUDEOXYGLUCOSE F - 18 (FDG) INJECTION
10.5400 | Freq: Once | INTRAVENOUS | Status: AC | PRN
  Administered 2016-07-19: 10.54 via INTRAVENOUS

## 2016-07-22 ENCOUNTER — Encounter: Payer: Self-pay | Admitting: Radiation Oncology

## 2016-07-24 ENCOUNTER — Ambulatory Visit: Payer: Medicare Other | Admitting: Family Medicine

## 2016-07-26 ENCOUNTER — Ambulatory Visit (HOSPITAL_BASED_OUTPATIENT_CLINIC_OR_DEPARTMENT_OTHER): Payer: Medicare Other | Admitting: Hematology & Oncology

## 2016-07-26 ENCOUNTER — Ambulatory Visit (HOSPITAL_BASED_OUTPATIENT_CLINIC_OR_DEPARTMENT_OTHER): Payer: Medicare Other

## 2016-07-26 ENCOUNTER — Encounter: Payer: Self-pay | Admitting: Radiation Oncology

## 2016-07-26 ENCOUNTER — Other Ambulatory Visit (HOSPITAL_BASED_OUTPATIENT_CLINIC_OR_DEPARTMENT_OTHER): Payer: Medicare Other

## 2016-07-26 VITALS — BP 121/62 | HR 77 | Temp 98.1°F | Resp 17 | Wt 229.0 lb

## 2016-07-26 DIAGNOSIS — C8339 Diffuse large B-cell lymphoma, extranodal and solid organ sites: Secondary | ICD-10-CM

## 2016-07-26 DIAGNOSIS — I4891 Unspecified atrial fibrillation: Secondary | ICD-10-CM | POA: Diagnosis not present

## 2016-07-26 DIAGNOSIS — C8589 Other specified types of non-Hodgkin lymphoma, extranodal and solid organ sites: Secondary | ICD-10-CM

## 2016-07-26 DIAGNOSIS — B370 Candidal stomatitis: Secondary | ICD-10-CM

## 2016-07-26 DIAGNOSIS — E860 Dehydration: Secondary | ICD-10-CM

## 2016-07-26 DIAGNOSIS — B3781 Candidal esophagitis: Secondary | ICD-10-CM

## 2016-07-26 DIAGNOSIS — Z95828 Presence of other vascular implants and grafts: Secondary | ICD-10-CM

## 2016-07-26 DIAGNOSIS — Z7901 Long term (current) use of anticoagulants: Secondary | ICD-10-CM

## 2016-07-26 DIAGNOSIS — D508 Other iron deficiency anemias: Secondary | ICD-10-CM

## 2016-07-26 LAB — CBC WITH DIFFERENTIAL (CANCER CENTER ONLY)
BASO#: 0 10*3/uL (ref 0.0–0.2)
BASO%: 0.4 % (ref 0.0–2.0)
EOS%: 3 % (ref 0.0–7.0)
Eosinophils Absolute: 0.2 10*3/uL (ref 0.0–0.5)
HCT: 30.4 % — ABNORMAL LOW (ref 38.7–49.9)
HEMOGLOBIN: 10.1 g/dL — AB (ref 13.0–17.1)
LYMPH#: 1.5 10*3/uL (ref 0.9–3.3)
LYMPH%: 29.4 % (ref 14.0–48.0)
MCH: 30.3 pg (ref 28.0–33.4)
MCHC: 33.2 g/dL (ref 32.0–35.9)
MCV: 91 fL (ref 82–98)
MONO#: 0.6 10*3/uL (ref 0.1–0.9)
MONO%: 11.3 % (ref 0.0–13.0)
NEUT%: 55.9 % (ref 40.0–80.0)
NEUTROS ABS: 2.8 10*3/uL (ref 1.5–6.5)
PLATELETS: 140 10*3/uL — AB (ref 145–400)
RBC: 3.33 10*6/uL — AB (ref 4.20–5.70)
RDW: 15.9 % — ABNORMAL HIGH (ref 11.1–15.7)
WBC: 5 10*3/uL (ref 4.0–10.0)

## 2016-07-26 LAB — CMP (CANCER CENTER ONLY)
ALBUMIN: 3.2 g/dL — AB (ref 3.3–5.5)
ALT(SGPT): 10 U/L (ref 10–47)
AST: 26 U/L (ref 11–38)
Alkaline Phosphatase: 90 U/L — ABNORMAL HIGH (ref 26–84)
BILIRUBIN TOTAL: 0.6 mg/dL (ref 0.20–1.60)
BUN: 13 mg/dL (ref 7–22)
CHLORIDE: 105 meq/L (ref 98–108)
CO2: 28 mEq/L (ref 18–33)
CREATININE: 1 mg/dL (ref 0.6–1.2)
Calcium: 9.3 mg/dL (ref 8.0–10.3)
Glucose, Bld: 108 mg/dL (ref 73–118)
Potassium: 3.6 mEq/L (ref 3.3–4.7)
SODIUM: 138 meq/L (ref 128–145)
TOTAL PROTEIN: 6.4 g/dL (ref 6.4–8.1)

## 2016-07-26 LAB — PROTIME-INR (CHCC SATELLITE)
INR: 4.3 — ABNORMAL HIGH (ref 2.0–3.5)
Protime: 51.6 Seconds — ABNORMAL HIGH (ref 10.6–13.4)

## 2016-07-26 LAB — LACTATE DEHYDROGENASE: LDH: 276 U/L — AB (ref 125–245)

## 2016-07-26 MED ORDER — SODIUM CHLORIDE 0.9% FLUSH
10.0000 mL | INTRAVENOUS | Status: DC | PRN
  Administered 2016-07-26: 10 mL via INTRAVENOUS
  Filled 2016-07-26: qty 10

## 2016-07-26 MED ORDER — HEPARIN SOD (PORK) LOCK FLUSH 100 UNIT/ML IV SOLN
500.0000 [IU] | Freq: Once | INTRAVENOUS | Status: AC
  Administered 2016-07-26: 500 [IU] via INTRAVENOUS
  Filled 2016-07-26: qty 5

## 2016-07-26 NOTE — Progress Notes (Signed)
Hematology and Oncology Follow Up Visit  Gary Yates [id number] 1933-03-18 83 y.o. 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 After 2 cycles of treatment, he really did not have a response. We did do a PET scan on him. The PET scan showed a large left adrenal lesion. This measured 6 x 4 cm. It was quite hypermetabolic.  There is no disease noted elsewhere.  He looks quite good. He has his complaints which are not related to his lymphoma.  I've already spoken to Dr. Sondra Come of radiation oncology. He will see Gary Yates next week for radiation therapy.  Gary Yates has severe back issues. He really needs to have his back operated on. Hopefully this will be done after his radiation therapy.  He's had no bleeding. He is on Coumadin.  There is no issues with his blood sugars.  He has chronic leg swelling. He wears compression stockings.  He's had no fever. He's had no cough.  He's not been hospitalized because of heart issues.   Overall, I would say that his performance status is ECOG 2.  Medications:  Current Outpatient Prescriptions:  .  ACCU-CHEK AVIVA PLUS test strip, TEST 3 TIMES A DAY (Patient taking differently: TEST 2 TIMES A DAY), Disp: 300 each, Rfl: 5 .  acetaminophen (TYLENOL) 500 MG tablet, Take 1,000 mg by mouth every 6 (six) hours as needed (for pain/fever/headaches.). , Disp: , Rfl:  .  atorvastatin (LIPITOR) 40 MG tablet, TAKE 1 TABLET BY MOUTH DAILY, Disp: 90 tablet, Rfl: 2 .  clopidogrel (PLAVIX) 75 MG tablet, TAKE 1 TABLET BY MOUTH DAILY, Disp: 30 tablet, Rfl: 8 .  docusate sodium (COLACE) 50 MG capsule, Take 1 capsule (50 mg total) by mouth 2 (two) times daily as needed (for constipation)., Disp: 90  capsule, Rfl: 0 .  furosemide (LASIX) 40 MG tablet, Take 1 tablet (40 mg total) by mouth 2 (two) times daily., Disp: 60 tablet, Rfl: 0 .  gabapentin (NEURONTIN) 300 MG capsule, Take 2 capsules (600 mg total) by mouth 3 (three) times daily., Disp: 540 capsule, Rfl: 2 .  HYDROcodone-acetaminophen (NORCO) 7.5-325 MG tablet, Take 1 tablet by mouth every 4 (four) hours as needed for moderate pain., Disp: , Rfl:  .  insulin lispro (HUMALOG) 100 UNIT/ML injection, Inject 4 Units into the skin daily with breakfast. , Disp: , Rfl:  .  metFORMIN (GLUCOPHAGE) 500 MG tablet, TAKE 1 TABLET BY MOUTH TWICE A DAY WITH A MEAL, Disp: 60 tablet, Rfl: 5 .  metoprolol succinate (TOPROL-XL) 100 MG 24 hr tablet, TAKE 1 TABLET EVERY DAY IMMEDIATELY FOLLOWING A MEAL, Disp: 90 tablet, Rfl: 1 .  Multiple Vitamins-Minerals (CENTRUM SILVER 50+MEN) TABS, Take 1 tablet by mouth daily., Disp: , Rfl:  .  nitroGLYCERIN (NITROSTAT) 0.4 MG SL tablet, Place 1 tablet (0.4 mg total) under the tongue every 5 (five) minutes as needed. Chest pain, Disp: 25 tablet, Rfl: 6 .  ondansetron (ZOFRAN) 4 MG tablet, Take 1 tablet (4 mg total) by mouth every 8 (eight) hours as needed for nausea or vomiting., Disp: 30 tablet, Rfl: 0 .  oxybutynin (DITROPAN XL) 15 MG 24 hr tablet, Take 15 mg by mouth at bedtime. , Disp: , Rfl:  .  oxymetazoline (AFRIN) 0.05 % nasal spray, Place 1-2 sprays into both nostrils 2 (two) times daily as needed for congestion., Disp: , Rfl:  .  pantoprazole (PROTONIX) 40 MG tablet, TAKE 1 TABLET BY MOUTH EVERY DAY, Disp: 90 tablet, Rfl: 3 .  potassium chloride SA (K-DUR,KLOR-CON) 20 MEQ tablet, Take 1 tablet (20 mEq total) by mouth daily., Disp: 30 tablet, Rfl: 0 .  pramipexole (MIRAPEX) 1.5 MG tablet, MAY TAKE 1 TO 1 AND 1/2 TABLETS BY MOUTH AT BEDTIME, Disp: 135 tablet, Rfl: 2 .  tamsulosin (FLOMAX) 0.4 MG CAPS capsule, Take 0.4 mg by mouth at bedtime., Disp: , Rfl:  .  temazepam (RESTORIL) 30 MG capsule, Take 1 capsule (30  mg total) by mouth at bedtime as needed for sleep., Disp: 30 capsule, Rfl: 0 .  TOUJEO SOLOSTAR 300 UNIT/ML SOPN, INJECT 20 UNITS INTO THE SKIN DAILY AFTER BREAKFAST., Disp: 15 mL, Rfl: 0 .  Trospium Chloride 60 MG CP24, Take 60 mg by mouth daily., Disp: , Rfl:  .  vitamin B-12 (CYANOCOBALAMIN) 1000 MCG tablet, Take 1,000 mcg by mouth daily., Disp: , Rfl:  .  warfarin (COUMADIN) 5 MG tablet, Take as directed by anticoagulation clinic (Patient taking differently: Take 7.5-10 mg by mouth every evening. Monday and Friday take 10 mg. All other days take 7.5 mg), Disp: 60 tablet, Rfl: 1 No current facility-administered medications for this visit.   Facility-Administered Medications Ordered in Other Visits:  .  sodium chloride flush (NS) 0.9 % injection 10 mL, 10 mL, Intravenous, PRN, Volanda Napoleon, MD, 10 mL at 07/26/16 0930 .  testosterone cypionate (DEPOTESTOTERONE CYPIONATE) injection 200 mg, 200 mg, Intramuscular, Q28 days, [name], Alinda Sierras, MD, 200 mg at 12/26/11 0850 .  testosterone cypionate (DEPOTESTOTERONE CYPIONATE) injection 200 mg, 200 mg, Intramuscular, Q28 days, [name], Alinda Sierras, MD, 200 mg at 01/23/12 0908 .  testosterone cypionate (DEPOTESTOTERONE CYPIONATE) injection 200 mg, 200 mg, Intramuscular, Q28 days, [name], Alinda Sierras, MD, 200 mg at 02/25/12 0848 .  testosterone cypionate (DEPOTESTOTERONE CYPIONATE) injection 200 mg, 200 mg, Intramuscular, Q28 days, [name], MD, 200 mg at 06/03/12 1437  Allergies:  Allergies  Allergen Reactions  . Ace Inhibitors Other (See Comments)    cough  . Codeine Nausea Only and Rash       . Penicillins Rash    Childhood allergy Has patient had a PCN reaction causing immediate rash, facial/tongue/throat swelling, SOB or lightheadedness with hypotension: Yes Has patient had a PCN reaction causing severe rash involving mucus membranes or skin necrosis: Yes Has patient had a PCN reaction that required hospitalization No Has patient  had a PCN reaction occurring within the last 10 years: No If all of the above answers are "NO", then may proceed with Cephalosporin use.     Past Medical History, Surgical history, Social history, and Family History were reviewed and updated.  Review of Systems:  As above  Physical Exam:  weight is 229 lb (103.9 kg). His oral temperature is 98.1 F (36.7 C). His blood pressure is 121/62 and his pulse is 77. His respiration is 17 and oxygen saturation is 100%.   Wt Readings from Last 3 Encounters:  07/26/16 229 lb (103.9 kg)  07/09/16 224 lb 1.6 oz (101.7 kg)  07/03/16 215 lb (97.5 kg)      Elderly white male. He started to lose his hair. Head and neck exam shows no ocular or oral lesions. There are no palpable cervical or supraclavicular lymph nodes. He has no  oral thrush. There is no oral mucositis. There is no palpable adenopathy in the neck. Lungs are clear to percussion and auscultation bilaterally. Cardiac exam regular rate and rhythm consistent with atrial fibrillation. He has no murmurs, rubs or bruits. Abdomen is soft. He has decent bowel sounds. There is no fluid wave. There is no guarding or rebound tenderness. He has no palpable hepatosplenomegaly. He has a healed right inguinal orchiectomy scar. Extremities shows chronic 2+ edema in his legs. He has some stasis dermatitis changes in his legs. Neurological exam shows no focal neurological deficits. Skin exam shows some brawny edema in his lower legs.  Lab Results  Component Value Date   WBC 5.0 07/26/2016   HGB 10.1 (L) 07/26/2016   HCT 30.4 (L) 07/26/2016   MCV 91 07/26/2016   PLT 140 (L) 07/26/2016     Chemistry      Component Value Date/Time   NA 138 07/26/2016 0814   NA 137 12/14/2015 1100   K 3.6 07/26/2016 0814   K 3.7 12/14/2015 1100   CL 105 07/26/2016 0814   CO2 28 07/26/2016 0814   CO2 24 12/14/2015 1100   BUN 13 07/26/2016 0814   BUN 18.4 12/14/2015 1100   CREATININE 1.0 07/26/2016 0814   CREATININE  0.9 12/14/2015 1100      Component Value Date/Time   CALCIUM 9.3 07/26/2016 0814   CALCIUM 9.5 12/14/2015 1100   ALKPHOS 90 (H) 07/26/2016 0814   ALKPHOS 102 12/14/2015 1100   AST 26 07/26/2016 0814   AST 22 12/14/2015 1100   ALT 10 07/26/2016 0814   ALT 10 12/14/2015 1100   BILITOT 0.60 07/26/2016 0814   BILITOT 0.58 12/14/2015 1100         Impression and Plan: Gary Yates is an 83 year old white male. He has relapsed large cell non-Hodgkin lymphoma of the testicle. He is on salvage chemotherapy with Rituxan/bendamustine/Velcade. Again, he is not a candidate for aggressive intervention. He definitely is not a candidate for stem cell transplantation.  I am very disappointed that he really did not respond to treatment.  We will get him over to radiation oncology. Hopefully, they will be able to help out. I think that given he only has one area of active disease, we should be able to get him into remission.  He clearly these have is back taking care of. Hopefully, after he finishes radiation, he will be able to have back surgery.  A lot of his issues are not oncologic in nature. He has had these for quite a while. Again they are related to his back.   I will see him back in 4 weeks. By then, he should be close to finishing his radiation. I probably would do a PET scan about 6 weeks after finishing his radiation.    Volanda Napoleon, MD 7/20/201811:10 AM

## 2016-07-26 NOTE — Progress Notes (Signed)
Lymphoma Location(s) / Histology: Enlarging left adrenal mass  Gary Yates. presented with symptoms of: left adrenal mass seen on PET scan from 07/19/16  Past/Anticipated interventions by medical oncology, if any: systemic chemotherapy with R-CHOP. He completed 4 cycles in November 2017.  Weight changes, if any, over the past 6 months: has lost 60 lbs over the last year.  Recurrent fevers, or drenching night sweats, if any: no  SAFETY ISSUES:  Prior radiation? 02/19/16 - 03/13/16 - Testis/Scrotum: 32.4 Gy in 18 fractions  Pacemaker/ICD? no  Possible current pregnancy? no  Is the patient on methotrexate? no  Current Complaints / other details:  Patient is here with his wife and son.  He reports having pain in his lower back today at a 3/10.  BP (!) 123/59 (BP Location: Right Arm, Patient Position: Sitting)   Pulse 82   Temp 98.6 F (37 C) (Oral)   Ht 5\' 9"  (1.753 m)   Wt 227 lb 12.8 oz (103.3 kg)   SpO2 99%   BMI 33.64 kg/m    Wt Readings from Last 3 Encounters:  07/31/16 227 lb 12.8 oz (103.3 kg)  07/26/16 229 lb (103.9 kg)  07/09/16 224 lb 1.6 oz (101.7 kg)

## 2016-07-30 NOTE — Progress Notes (Signed)
Physical Therapy Note  These are the G-code associated with eval completed on 07/07/16.    07-Jul-2016 1305  PT G-Codes **NOT FOR INPATIENT CLASS**  Functional Assessment Tool Used Clinical judgement  Functional Limitation Mobility: Walking and moving around  Mobility: Walking and Moving Around Current Status (T9471) CJ  Mobility: Walking and Moving Around Goal Status (G5271) CI    Kittie Plater, PT, DPT Pager #: [phone] Office #: [phone]

## 2016-07-31 ENCOUNTER — Encounter: Payer: Self-pay | Admitting: Radiation Oncology

## 2016-07-31 ENCOUNTER — Ambulatory Visit
Admission: RE | Admit: 2016-07-31 | Discharge: 2016-07-31 | Disposition: A | Discharge status: Home / Self Care | Payer: Medicare Other | Source: Ambulatory Visit | Attending: Radiation Oncology | Admitting: Radiation Oncology

## 2016-07-31 VITALS — BP 123/59 | HR 82 | Temp 98.6°F | Ht 69.0 in | Wt 227.8 lb

## 2016-07-31 DIAGNOSIS — C8589 Other specified types of non-Hodgkin lymphoma, extranodal and solid organ sites: Secondary | ICD-10-CM

## 2016-07-31 DIAGNOSIS — Z510 Encounter for antineoplastic radiation therapy: Secondary | ICD-10-CM | POA: Insufficient documentation

## 2016-07-31 DIAGNOSIS — Z7902 Long term (current) use of antithrombotics/antiplatelets: Secondary | ICD-10-CM | POA: Insufficient documentation

## 2016-07-31 DIAGNOSIS — Z79899 Other long term (current) drug therapy: Secondary | ICD-10-CM | POA: Insufficient documentation

## 2016-07-31 DIAGNOSIS — Z885 Allergy status to narcotic agent status: Secondary | ICD-10-CM | POA: Diagnosis not present

## 2016-07-31 DIAGNOSIS — Z7901 Long term (current) use of anticoagulants: Secondary | ICD-10-CM | POA: Diagnosis not present

## 2016-07-31 DIAGNOSIS — Z888 Allergy status to other drugs, medicaments and biological substances status: Secondary | ICD-10-CM | POA: Diagnosis not present

## 2016-07-31 DIAGNOSIS — Z880 Allergy status to penicillin: Secondary | ICD-10-CM | POA: Diagnosis not present

## 2016-07-31 DIAGNOSIS — M549 Dorsalgia, unspecified: Secondary | ICD-10-CM | POA: Diagnosis not present

## 2016-07-31 DIAGNOSIS — Z923 Personal history of irradiation: Secondary | ICD-10-CM | POA: Insufficient documentation

## 2016-07-31 DIAGNOSIS — Z794 Long term (current) use of insulin: Secondary | ICD-10-CM | POA: Diagnosis not present

## 2016-07-31 HISTORY — DX: Personal history of irradiation: Z92.3

## 2016-07-31 NOTE — Addendum Note (Signed)
Encounter addended by: Jacqulyn Liner, RN on: 07/31/2016  1:52 PM<BR>    Actions taken: Charge Capture section accepted

## 2016-07-31 NOTE — Progress Notes (Signed)
Radiation Oncology         [phone] ________________________________  Name: Gary Yates. MRN: [id number]  Date: 07/31/2016  DOB: 1933-10-23  Re-Consultation Visit Note  CC: Gary Post, MD  Gary Napoleon, MD    ICD-10-CM   1. 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 He is accompanied by his wife and son today.                  The patient underwent PET scan on 07/19/16 which showed an enlarging left adrenal mass, maximum SUV 34.1, compatible with biopsy-proven lymphoma. No other definite regions of lymphoma are identified. There is extensive physiologic activity in bowel, which can reduce sensitivity in detecting bowel lesions. No CT correlate. Also noted are bilateral renal cysts.  The patient has previously undergone 4 cycles of chemotherapy with Dr. Marin Yates with little to no clinical response. He has kindly been referred back to RadOnc for consideration of addition RT.  On review of systems, the patient reports back pain related to a ruptured disk, which is ongoing, and 3/10 in severity. He denies any other pain or discomfort. He has lost over 60 lbs in the last year. He denies recurrent fever or drenching night sweats. Overall the patient is without complaint.  ALLERGIES:  is allergic to ace inhibitors; codeine; and penicillins.  Meds: Current Outpatient Prescriptions  Medication Sig Dispense Refill  . ACCU-CHEK AVIVA PLUS test strip TEST 3 TIMES A DAY (Patient taking differently: TEST 2 TIMES A DAY) 300 each 5  . acetaminophen (TYLENOL) 500 MG tablet Take 1,000 mg by mouth every 6 (six) hours as needed (for pain/fever/headaches.).     Marland Kitchen atorvastatin (LIPITOR) 40 MG tablet TAKE 1 TABLET BY MOUTH DAILY 90 tablet 2  . clopidogrel  (PLAVIX) 75 MG tablet TAKE 1 TABLET BY MOUTH DAILY 30 tablet 8  . docusate sodium (COLACE) 50 MG capsule Take 1 capsule (50 mg total) by mouth 2 (two) times daily as needed (for constipation). 90 capsule 0  . furosemide (LASIX) 40 MG tablet Take 1 tablet (40 mg total) by mouth 2 (two) times daily. 60 tablet 0  . gabapentin (NEURONTIN) 300 MG capsule Take 2 capsules (600 mg total) by mouth 3 (three) times daily. 540 capsule 2  . insulin lispro (HUMALOG) 100 UNIT/ML injection Inject 4 Units into the skin daily with breakfast.     . metFORMIN (GLUCOPHAGE) 500 MG tablet TAKE 1 TABLET BY MOUTH TWICE A DAY WITH A MEAL 60 tablet 5  . metoprolol succinate (TOPROL-XL) 100 MG 24 hr tablet TAKE 1 TABLET EVERY DAY IMMEDIATELY FOLLOWING A MEAL 90 tablet 1  . Multiple Vitamins-Minerals (CENTRUM SILVER 50+MEN) TABS Take 1 tablet by mouth daily.    . nitroGLYCERIN (NITROSTAT) 0.4 MG SL tablet Place 1 tablet (0.4 mg total) under the tongue every 5 (five) minutes as needed. Chest pain 25 tablet 6  . oxybutynin (DITROPAN XL) 15 MG 24 hr tablet Take 15 mg by mouth at bedtime.     Marland Kitchen oxymetazoline (AFRIN) 0.05 % nasal spray Place 1-2 sprays into both nostrils 2 (two) times daily as needed for congestion.    . potassium chloride SA (K-DUR,KLOR-CON) 20 MEQ tablet Take 1 tablet (20 mEq total) by mouth  daily. 30 tablet 0  . pramipexole (MIRAPEX) 1.5 MG tablet MAY TAKE 1 TO 1 AND 1/2 TABLETS BY MOUTH AT BEDTIME 135 tablet 2  . tamsulosin (FLOMAX) 0.4 MG CAPS capsule Take 0.4 mg by mouth at bedtime.    . temazepam (RESTORIL) 30 MG capsule Take 1 capsule (30 mg total) by mouth at bedtime as needed for sleep. 30 capsule 0  . TOUJEO SOLOSTAR 300 UNIT/ML SOPN INJECT 20 UNITS INTO THE SKIN DAILY AFTER BREAKFAST. 15 mL 0  . Trospium Chloride 60 MG CP24 Take 60 mg by mouth daily.    . vitamin B-12 (CYANOCOBALAMIN) 1000 MCG tablet Take 1,000 mcg by mouth daily.    Marland Kitchen warfarin (COUMADIN) 5 MG tablet Take as directed by anticoagulation  clinic (Patient taking differently: Take 7.5-10 mg by mouth every evening. Monday and Friday take 10 mg. All other days take 7.5 mg) 60 tablet 1  . HYDROcodone-acetaminophen (NORCO) 7.5-325 MG tablet Take 1 tablet by mouth every 4 (four) hours as needed for moderate pain.    Marland Kitchen ondansetron (ZOFRAN) 4 MG tablet Take 1 tablet (4 mg total) by mouth every 8 (eight) hours as needed for nausea or vomiting. (Patient not taking: Reported on 07/31/2016) 30 tablet 0  . pantoprazole (PROTONIX) 40 MG tablet TAKE 1 TABLET BY MOUTH EVERY DAY (Patient not taking: Reported on 07/31/2016) 90 tablet 3   No current facility-administered medications for this encounter.    Facility-Administered Medications Ordered in Other Encounters  Medication Dose Route Frequency Provider Last Rate Last Dose  . testosterone cypionate (DEPOTESTOTERONE CYPIONATE) injection 200 mg  200 mg Intramuscular Q28 days Gary Post, MD   200 mg at 12/26/11 0850  . testosterone cypionate (DEPOTESTOTERONE CYPIONATE) injection 200 mg  200 mg Intramuscular Q28 days Gary Post, MD   200 mg at 01/23/12 0908  . testosterone cypionate (DEPOTESTOTERONE CYPIONATE) injection 200 mg  200 mg Intramuscular Q28 days Gary Post, MD   200 mg at 02/25/12 0848  . testosterone cypionate (DEPOTESTOTERONE CYPIONATE) injection 200 mg  200 mg Intramuscular Q28 days Gary Post, MD   200 mg at 06/03/12 1437   REVIEW OF SYSTEMS: A 10+ POINT REVIEW OF SYSTEMS WAS OBTAINED including neurology, dermatology, psychiatry, cardiac, respiratory, lymph, extremities, GI, GU, musculoskeletal, constitutional, reproductive, HEENT. All pertinent positives are noted in the HPI. All others are negative.  Physical Findings: The patient is in no acute distress. Patient is alert and oriented.  height is 5\' 9"  (1.753 m) and weight is 227 lb 12.8 oz (103.3 kg). His oral temperature is 98.6 F (37 C). His blood pressure is 123/59 (abnormal) and his pulse is 82.  His oxygen saturation is 99%.  No significant changes. The patient presents in a wheelchair today. General: Alert and oriented, in no acute distress. HEENT: Head is normocephalic. Neck: Neck is supple, no palpable cervical or supraclavicular lymphadenopathy. Heart: Regular in rate and rhythm with no murmurs. Chest: Clear to auscultation bilaterally. Abdomen: Soft, non tender, non distended.  Lab Findings: Lab Results  Component Value Date   WBC 5.0 07/26/2016   HGB 10.1 (L) 07/26/2016   HCT 30.4 (L) 07/26/2016   MCV 91 07/26/2016   PLT 140 (L) 07/26/2016    Radiographic Findings: Ct Abdomen Pelvis W Contrast  Result Date: 07/01/2016 CLINICAL DATA:  Progressive bilateral lower extremity swelling. Non-Hodgkin's lymphoma with adrenal involvement. Previous appendectomy and cholecystectomy. EXAM: CT ABDOMEN AND PELVIS WITH CONTRAST TECHNIQUE: Multidetector CT imaging of the abdomen and pelvis  was performed using the standard protocol following bolus administration of intravenous contrast. CONTRAST:  17mL ISOVUE-300 IOPAMIDOL (ISOVUE-300) INJECTION 61% COMPARISON:  PET-CT dated 04/22/2016. FINDINGS: Lower chest: Small amount of linear atelectasis and scarring at the lung bases. Hepatobiliary: Cholecystectomy clips.  Unremarkable liver. Pancreas: Unremarkable. No pancreatic ductal dilatation or surrounding inflammatory changes. Spleen: Normal in size without focal abnormality. Adrenals/Urinary Tract: The previously demonstrated 4.5 x 3.0 cm left adrenal mass currently measures 5.4 x 4.4 cm on image number 26 of series 3. Stable bilateral renal cysts. The current images demonstrate a 4 mm nodular density in the posterior aspect of the largest cyst on the right. This is seen on image 41 of series 3. Distended urinary bladder extending into the upper pelvis. Interval minimal dilatation of both renal collecting systems and ureters. No urinary tract calculi. Stomach/Bowel: Normal appearing stomach, small  bowel and colon. Surgically absent appendix. Vascular/Lymphatic: Atheromatous arterial calcifications without aneurysm. These include the coronary arteries. No enlarged lymph nodes. Reproductive: Moderate to markedly enlarged prostate gland protruding into the base of the urinary bladder. Other: Small left inguinal hernia containing fat. Small umbilical and paraumbilical hernias containing fat. Musculoskeletal: Lumbar and lower thoracic spine degenerative changes. Mild levoconvex thoracolumbar scoliosis. Lumbar spine laminectomy defects. IMPRESSION: 1. Increased size of the left adrenal lymphomatous mass, currently measuring 5.4 x 4.4 cm. 2. Interval 4 mm nodular opacity in the posterior aspect of the largest right renal cyst. This most likely represents a small area of tumefactive debris. A small mass is less likely. This can be re-evaluated on follow-up imaging. 3. Small left inguinal, umbilical and paraumbilical hernias containing fat. 4. Calcified coronary artery atherosclerosis. 5. Moderately to markedly enlarged prostate gland with interval distention of the urinary bladder and associated minimal bilateral hydronephrosis. Electronically Signed   By: Claudie Revering M.D.   On: 07/01/2016 20:14   Nm Pet Image Restag (ps) Skull Base To Thigh  Result Date: 07/19/2016 CLINICAL DATA:  Subsequent treatment strategy for diffuse non-Hodgkin's (large B-cell) lymphoma. EXAM: NUCLEAR MEDICINE PET SKULL BASE TO THIGH TECHNIQUE: 10.5 mCi F-18 FDG was injected intravenously. Full-ring PET imaging was performed from the skull base to thigh after the radiotracer. CT data was obtained and used for attenuation correction and anatomic localization. FASTING BLOOD GLUCOSE:  Value: 93 mg/dl COMPARISON:  Multiple exams, including 04/22/2016 FINDINGS: NECK No hypermetabolic lymph nodes in the neck. Carotid atherosclerotic calcification bilaterally. CHEST No hypermetabolic mediastinal or hilar nodes. No suspicious pulmonary nodules on  the CT data. Coronary, aortic arch, and branch vessel atherosclerotic vascular disease. Cardiomegaly noted. Right Port-A-Cath tip: Cavoatrial junction. Chronic volume loss posteriorly in the lingula. ABDOMEN/PELVIS The left adrenal mass measures 6.8 by 5.2 cm (formerly 4.5 by 3.0 cm) and has a maximum standard uptake value of 34.1 (formerly 24.0). Photopenic renal cysts. Considerable scattered physiologic activity in bowel. No splenomegaly or splenic lesion identified. Cholecystectomy.  Prostatomegaly. SKELETON No focal hypermetabolic activity to suggest skeletal metastasis. 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 IMPRESSION: 1. Enlarging left adrenal mass, maximum SUV 34.1, compatible with biopsy-proven lymphoma. No other definite regions of lymphoma are identified. 2. Extensive physiologic activity in bowel, which can reduce sensitivity in detecting bowel lesions. No CT correlate. 3. Aortic Atherosclerosis (ICD10-I70.0). Coronary atherosclerosis and cardiomegaly. 4. Bilateral renal cysts. 5. Prostatomegaly. 6. 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 Electronically Signed   By: Van Clines M.D.   On: 07/19/2016 10:31    Impression:  This is a pleasant  patient with relapsed large cell lymphoma of the testis,  with little to no response to systemic therapy ( Rituxan/Bendamustine/Velcade - s/p cycle #2)  This patient is a good candidate for 10 fractions of RT given over 2 weeks.   Today, I talked to the patient and family about the findings and work-up thus far.  We discussed the natural history of relapsed lymphoma and general treatment, highlighting the role of radiotherapy in the management.  We discussed the available radiation techniques, and focused on the details of logistics and delivery.  We reviewed the anticipated acute and late sequelae associated with radiation in this setting.  The patient was encouraged to ask questions that I  answered to the best of my ability. The patient would like to proceed with radiation and will be scheduled for CT simulation. A consent form was discussed and signed today.  Plan:  The patient is scheduled for CT simulation and treatment planning tomorrow, 7/26 at 8 am. He will begin radiation therapy Wednesday, 8/1.  -----------------------------------  Blair Promise, PhD, MD  This document serves as a record of services personally performed by [name], MD. It was created on his behalf by Maryla Morrow, a trained medical scribe. The creation of this record is based on the scribe's personal observations and the provider's statements to them. This document has been checked and approved by the attending provider.

## 2016-07-31 NOTE — Progress Notes (Signed)
Please see the Nurse Progress Note in the MD Initial Consult Encounter for this patient. 

## 2016-08-01 ENCOUNTER — Ambulatory Visit
Admission: RE | Admit: 2016-08-01 | Discharge: 2016-08-01 | Disposition: A | Discharge status: Home / Self Care | Payer: Medicare Other | Source: Ambulatory Visit | Attending: Radiation Oncology | Admitting: Radiation Oncology

## 2016-08-01 ENCOUNTER — Ambulatory Visit: Payer: Medicare Other | Admitting: Hematology & Oncology

## 2016-08-01 ENCOUNTER — Ambulatory Visit: Payer: Medicare Other

## 2016-08-01 ENCOUNTER — Other Ambulatory Visit: Payer: Medicare Other

## 2016-08-01 DIAGNOSIS — Z510 Encounter for antineoplastic radiation therapy: Secondary | ICD-10-CM | POA: Diagnosis not present

## 2016-08-01 DIAGNOSIS — C8589 Other specified types of non-Hodgkin lymphoma, extranodal and solid organ sites: Secondary | ICD-10-CM

## 2016-08-01 NOTE — Progress Notes (Signed)
  Radiation Oncology         [phone]) [phone] ________________________________  Name: Gary Yates. MRN: [id number]  Date: 08/01/2016  DOB: Jul 03, 1933  SIMULATION AND TREATMENT PLANNING NOTE    ICD-10-CM   1. 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  Identity was confirmed.  All relevant records and images related to the planned course of therapy were reviewed.  The patient freely provided informed written consent to proceed with treatment after reviewing the details related to the planned course of therapy. The consent form was witnessed and verified by the simulation staff.  Then, the patient was set-up in a stable reproducible  supine position for radiation therapy.  CT images were obtained.  Surface markings were placed.  The CT images were loaded into the planning software.  Then the target and avoidance structures were contoured.  Treatment planning then occurred.  The radiation prescription was entered and confirmed.  Then, I designed and supervised the construction of a total of 5 medically necessary complex treatment devices.  I have requested : 3D Simulation  I have requested a DVH of the following structures: GTV, PTV, kidneys, liver , bowel.  I have ordered:dose calc.  PLAN:  The patient will receive 30 Gy in 10 fractions.  Will consider IMRT if left kidney dose becomes an issue with planning.  -----------------------------------  Blair Promise, PhD, MD  This document serves as a record of services personally performed by [name], MD. It was created on his behalf by Arlyce Harman, a trained medical scribe. The creation of this record is based on the scribe's personal observations and the provider's statements to them. This document has been checked and approved by the attending provider.

## 2016-08-02 ENCOUNTER — Inpatient Hospital Stay: Payer: Medicare Other

## 2016-08-03 ENCOUNTER — Other Ambulatory Visit: Payer: Self-pay | Admitting: Family Medicine

## 2016-08-05 DIAGNOSIS — Z510 Encounter for antineoplastic radiation therapy: Secondary | ICD-10-CM | POA: Diagnosis not present

## 2016-08-07 ENCOUNTER — Ambulatory Visit
Admission: RE | Admit: 2016-08-07 | Discharge: 2016-08-07 | Disposition: A | Discharge status: Home / Self Care | Payer: Medicare Other | Source: Ambulatory Visit | Attending: Radiation Oncology | Admitting: Radiation Oncology

## 2016-08-07 DIAGNOSIS — Z510 Encounter for antineoplastic radiation therapy: Secondary | ICD-10-CM | POA: Diagnosis not present

## 2016-08-07 DIAGNOSIS — C8589 Other specified types of non-Hodgkin lymphoma, extranodal and solid organ sites: Secondary | ICD-10-CM

## 2016-08-07 NOTE — Progress Notes (Signed)
  Radiation Oncology         [phone]) [phone] ________________________________  Name: Gary Yates. MRN: [id number]  Date: 08/07/2016  DOB: 09/07/1933  Simulation Verification Note    ICD-10-CM   1. Diffuse non-Hodgkin's lymphoma of testis ([facility]) C85.89     Status: outpatient  NARRATIVE: The patient was brought to the treatment unit and placed in the planned treatment position. The clinical setup was verified. Then port films were obtained and uploaded to the radiation oncology medical record software.  The treatment beams were carefully compared against the planned radiation fields. The position location and shape of the radiation fields was reviewed. They targeted volume of tissue appears to be appropriately covered by the radiation beams. Organs at risk appear to be excluded as planned.  Based on my personal review, I approved the simulation verification. The patient's treatment will proceed as planned.  -----------------------------------  Blair Promise, PhD, MD

## 2016-08-08 ENCOUNTER — Other Ambulatory Visit: Payer: Self-pay | Admitting: Family Medicine

## 2016-08-08 ENCOUNTER — Other Ambulatory Visit: Payer: Medicare Other

## 2016-08-08 ENCOUNTER — Ambulatory Visit
Admission: RE | Admit: 2016-08-08 | Discharge: 2016-08-08 | Disposition: A | Discharge status: Home / Self Care | Payer: Medicare Other | Source: Ambulatory Visit | Attending: Radiation Oncology | Admitting: Radiation Oncology

## 2016-08-08 ENCOUNTER — Ambulatory Visit: Payer: Medicare Other

## 2016-08-08 DIAGNOSIS — Z510 Encounter for antineoplastic radiation therapy: Secondary | ICD-10-CM | POA: Diagnosis not present

## 2016-08-09 ENCOUNTER — Ambulatory Visit
Admission: RE | Admit: 2016-08-09 | Discharge: 2016-08-09 | Disposition: A | Discharge status: Home / Self Care | Payer: Medicare Other | Source: Ambulatory Visit | Attending: Radiation Oncology | Admitting: Radiation Oncology

## 2016-08-09 DIAGNOSIS — Z510 Encounter for antineoplastic radiation therapy: Secondary | ICD-10-CM | POA: Diagnosis not present

## 2016-08-12 ENCOUNTER — Ambulatory Visit
Admission: RE | Admit: 2016-08-12 | Discharge: 2016-08-12 | Disposition: A | Discharge status: Home / Self Care | Payer: Medicare Other | Source: Ambulatory Visit | Attending: Radiation Oncology | Admitting: Radiation Oncology

## 2016-08-12 DIAGNOSIS — Z510 Encounter for antineoplastic radiation therapy: Secondary | ICD-10-CM | POA: Diagnosis not present

## 2016-08-13 ENCOUNTER — Ambulatory Visit
Admission: RE | Admit: 2016-08-13 | Discharge: 2016-08-13 | Disposition: A | Discharge status: Home / Self Care | Payer: Medicare Other | Source: Ambulatory Visit | Attending: Radiation Oncology | Admitting: Radiation Oncology

## 2016-08-13 DIAGNOSIS — C8589 Other specified types of non-Hodgkin lymphoma, extranodal and solid organ sites: Secondary | ICD-10-CM

## 2016-08-13 DIAGNOSIS — Z510 Encounter for antineoplastic radiation therapy: Secondary | ICD-10-CM | POA: Diagnosis not present

## 2016-08-13 NOTE — Progress Notes (Signed)
Pt here for patient teaching.  Pt given Radiation and You booklet.  Reviewed areas of pertinence such as diarrhea, fatigue and nausea and vomiting . Pt able to give teach back of to pat skin, use unscented/gentle soap and drink plenty of water,avoid applying anything to skin within 4 hours of treatment. Pt demonstrated understanding and verbalizes understanding of information given and will contact nursing with any questions or concerns.

## 2016-08-14 ENCOUNTER — Ambulatory Visit
Admission: RE | Admit: 2016-08-14 | Discharge: 2016-08-14 | Disposition: A | Discharge status: Home / Self Care | Payer: Medicare Other | Source: Ambulatory Visit | Attending: Radiation Oncology | Admitting: Radiation Oncology

## 2016-08-14 DIAGNOSIS — Z510 Encounter for antineoplastic radiation therapy: Secondary | ICD-10-CM | POA: Diagnosis not present

## 2016-08-15 ENCOUNTER — Ambulatory Visit
Admission: RE | Admit: 2016-08-15 | Discharge: 2016-08-15 | Disposition: A | Discharge status: Home / Self Care | Payer: Medicare Other | Source: Ambulatory Visit | Attending: Radiation Oncology | Admitting: Radiation Oncology

## 2016-08-15 DIAGNOSIS — Z510 Encounter for antineoplastic radiation therapy: Secondary | ICD-10-CM | POA: Diagnosis not present

## 2016-08-16 ENCOUNTER — Ambulatory Visit
Admission: RE | Admit: 2016-08-16 | Discharge: 2016-08-16 | Disposition: A | Discharge status: Home / Self Care | Payer: Medicare Other | Source: Ambulatory Visit | Attending: Radiation Oncology | Admitting: Radiation Oncology

## 2016-08-16 DIAGNOSIS — Z510 Encounter for antineoplastic radiation therapy: Secondary | ICD-10-CM | POA: Diagnosis not present

## 2016-08-19 ENCOUNTER — Ambulatory Visit
Admission: RE | Admit: 2016-08-19 | Discharge: 2016-08-19 | Disposition: A | Discharge status: Home / Self Care | Payer: Medicare Other | Source: Ambulatory Visit | Attending: Radiation Oncology | Admitting: Radiation Oncology

## 2016-08-19 DIAGNOSIS — Z510 Encounter for antineoplastic radiation therapy: Secondary | ICD-10-CM | POA: Diagnosis not present

## 2016-08-20 ENCOUNTER — Ambulatory Visit
Admission: RE | Admit: 2016-08-20 | Discharge: 2016-08-20 | Disposition: A | Discharge status: Home / Self Care | Payer: Medicare Other | Source: Ambulatory Visit | Attending: Radiation Oncology | Admitting: Radiation Oncology

## 2016-08-20 ENCOUNTER — Encounter: Payer: Self-pay | Admitting: Radiation Oncology

## 2016-08-20 DIAGNOSIS — Z510 Encounter for antineoplastic radiation therapy: Secondary | ICD-10-CM | POA: Diagnosis not present

## 2016-08-21 NOTE — Progress Notes (Signed)
  Radiation Oncology         [phone]) [phone] ________________________________  Name: Gary Yates. MRN: [id number]  Date: 08/20/2016  DOB: [birth year]  End of Treatment Note  Diagnosis:   83 year-old gentleman with relapsed large cell lymphoma of the testis.      Indication for treatment:  Isolated relapse in the left adrenal gland       Radiation treatment dates:   08/07/2016 to 08/20/2016  Site/dose:   The left adrenal gland mass was treated to 30 Gy in 10 fractions at 3 Gy per fraction.  Beams/energy:   3-D // 10X, 15X  Narrative: The patient tolerated radiation treatment relatively well.   Denies pain. Denies any bowel or bladder disturbances. Denies nausea or vomiting. Denies skin issues. He did report mild fatigue.  Plan: The patient has completed radiation treatment. The patient will return to radiation oncology clinic for routine followup in one month. I advised them to call or return sooner if they have any questions or concerns related to their recovery or treatment.  -----------------------------------  Blair Promise, PhD, MD  This document serves as a record of services personally performed by [name], MD. It was created on his behalf by Arlyce Harman, a trained medical scribe. The creation of this record is based on the scribe's personal observations and the provider's statements to them. This document has been checked and approved by the attending provider.

## 2016-08-23 ENCOUNTER — Ambulatory Visit (HOSPITAL_BASED_OUTPATIENT_CLINIC_OR_DEPARTMENT_OTHER): Payer: Medicare Other | Admitting: Hematology & Oncology

## 2016-08-23 ENCOUNTER — Other Ambulatory Visit (HOSPITAL_BASED_OUTPATIENT_CLINIC_OR_DEPARTMENT_OTHER): Payer: Medicare Other

## 2016-08-23 ENCOUNTER — Ambulatory Visit (HOSPITAL_BASED_OUTPATIENT_CLINIC_OR_DEPARTMENT_OTHER): Payer: Medicare Other

## 2016-08-23 VITALS — BP 110/58 | HR 80 | Temp 98.4°F | Resp 17 | Wt 228.4 lb

## 2016-08-23 DIAGNOSIS — C8589 Other specified types of non-Hodgkin lymphoma, extranodal and solid organ sites: Secondary | ICD-10-CM

## 2016-08-23 DIAGNOSIS — I2510 Atherosclerotic heart disease of native coronary artery without angina pectoris: Secondary | ICD-10-CM

## 2016-08-23 DIAGNOSIS — C8339 Diffuse large B-cell lymphoma, extranodal and solid organ sites: Secondary | ICD-10-CM

## 2016-08-23 DIAGNOSIS — Z95828 Presence of other vascular implants and grafts: Secondary | ICD-10-CM

## 2016-08-23 DIAGNOSIS — D508 Other iron deficiency anemias: Secondary | ICD-10-CM

## 2016-08-23 LAB — CMP (CANCER CENTER ONLY)
ALBUMIN: 3.5 g/dL (ref 3.3–5.5)
ALT(SGPT): 11 U/L (ref 10–47)
AST: 25 U/L (ref 11–38)
Alkaline Phosphatase: 65 U/L (ref 26–84)
BUN, Bld: 12 mg/dL (ref 7–22)
CALCIUM: 9.4 mg/dL (ref 8.0–10.3)
CHLORIDE: 104 meq/L (ref 98–108)
CO2: 27 meq/L (ref 18–33)
Creat: 0.9 mg/dl (ref 0.6–1.2)
Glucose, Bld: 118 mg/dL (ref 73–118)
Potassium: 4 mEq/L (ref 3.3–4.7)
Sodium: 141 mEq/L (ref 128–145)
Total Bilirubin: 0.7 mg/dl (ref 0.20–1.60)
Total Protein: 6.5 g/dL (ref 6.4–8.1)

## 2016-08-23 LAB — PROTIME-INR (CHCC SATELLITE)
INR: 2.7 (ref 2.0–3.5)
Protime: 32.4 Seconds — ABNORMAL HIGH (ref 10.6–13.4)

## 2016-08-23 LAB — CBC WITH DIFFERENTIAL (CANCER CENTER ONLY)
BASO#: 0.1 10*3/uL (ref 0.0–0.2)
BASO%: 4.6 % — ABNORMAL HIGH (ref 0.0–2.0)
EOS ABS: 0.1 10*3/uL (ref 0.0–0.5)
EOS%: 5 % (ref 0.0–7.0)
HEMATOCRIT: 31 % — AB (ref 38.7–49.9)
HGB: 10.4 g/dL — ABNORMAL LOW (ref 13.0–17.1)
LYMPH#: 0.2 10*3/uL — AB (ref 0.9–3.3)
LYMPH%: 6.4 % — ABNORMAL LOW (ref 14.0–48.0)
MCH: 30.9 pg (ref 28.0–33.4)
MCHC: 33.5 g/dL (ref 32.0–35.9)
MCV: 92 fL (ref 82–98)
MONO#: 0.5 10*3/uL (ref 0.1–0.9)
MONO%: 16.1 % — AB (ref 0.0–13.0)
NEUT%: 67.9 % (ref 40.0–80.0)
NEUTROS ABS: 1.9 10*3/uL (ref 1.5–6.5)
Platelets: 111 10*3/uL — ABNORMAL LOW (ref 145–400)
RBC: 3.37 10*6/uL — ABNORMAL LOW (ref 4.20–5.70)
RDW: 15.7 % (ref 11.1–15.7)
WBC: 2.8 10*3/uL — ABNORMAL LOW (ref 4.0–10.0)

## 2016-08-23 MED ORDER — HEPARIN SOD (PORK) LOCK FLUSH 100 UNIT/ML IV SOLN
500.0000 [IU] | Freq: Once | INTRAVENOUS | Status: AC
  Administered 2016-08-23: 500 [IU] via INTRAVENOUS
  Filled 2016-08-23: qty 5

## 2016-08-23 MED ORDER — SODIUM CHLORIDE 0.9% FLUSH
10.0000 mL | INTRAVENOUS | Status: DC | PRN
  Administered 2016-08-23: 10 mL via INTRAVENOUS
  Filled 2016-08-23: qty 10

## 2016-08-23 NOTE — Progress Notes (Signed)
Hematology and Oncology Follow Up Visit  Gary Yates [id number] 1933-12-21 83 y.o. 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 He received 30 Gy to the left adrenal gland. He completed this on August 14. He did very well with this.  He's had no abdominal pain. There is no nausea or vomiting. He's had no diarrhea. He's had no dysuria. He's had no rashes.  He still has his multitude of other health issues. He is not had any bleeding. He is on Coumadin. He is on Plavix.   He does have the severe back issues. He is going to need surgery. Currently, he will wear a brace to try to help his back.   He has leg swelling. This is chronic.  His appetite has been quite good.  He's had no headache.   Overall, I would say that his performance status is ECOG 2.  Medications:  Current Outpatient Prescriptions:  .  ACCU-CHEK AVIVA PLUS test strip, TEST 3 TIMES A DAY (Patient taking differently: TEST 2 TIMES A DAY), Disp: 300 each, Rfl: 5 .  acetaminophen (TYLENOL) 500 MG tablet, Take 1,000 mg by mouth every 6 (six) hours as needed (for pain/fever/headaches.). , Disp: , Rfl:  .  atorvastatin (LIPITOR) 40 MG tablet, TAKE 1 TABLET BY MOUTH DAILY, Disp: 90 tablet, Rfl: 2 .  clopidogrel (PLAVIX) 75 MG tablet, TAKE 1 TABLET BY MOUTH DAILY, Disp: 30 tablet, Rfl: 8 .  docusate sodium (COLACE) 50 MG capsule, Take 1 capsule (50 mg total) by mouth 2 (two) times daily as needed (for constipation)., Disp: 90 capsule, Rfl: 0 .  furosemide (LASIX) 40 MG tablet, Take 1 tablet (40 mg total) by mouth 2 (two) times daily., Disp: 60 tablet, Rfl: 0 .  gabapentin  (NEURONTIN) 300 MG capsule, Take 2 capsules (600 mg total) by mouth 3 (three) times daily., Disp: 540 capsule, Rfl: 2 .  HYDROcodone-acetaminophen (NORCO) 7.5-325 MG tablet, Take 1 tablet by mouth every 4 (four) hours as needed for moderate pain., Disp: , Rfl:  .  insulin lispro (HUMALOG) 100 UNIT/ML injection, Inject 4 Units into the skin daily with breakfast. , Disp: , Rfl:  .  KLOR-CON M20 20 MEQ tablet, TAKE 1 TABLET BY MOUTH EVERY DAY, Disp: 90 tablet, Rfl: 0 .  metFORMIN (GLUCOPHAGE) 500 MG tablet, TAKE 1 TABLET BY MOUTH TWICE A DAY WITH A MEAL, Disp: 60 tablet, Rfl: 5 .  metFORMIN (GLUCOPHAGE) 500 MG tablet, TAKE 1 TABLET BY MOUTH TWICE A DAY WITH A MEAL, Disp: 60 tablet, Rfl: 5 .  metoprolol succinate (TOPROL-XL) 100 MG 24 hr tablet, TAKE 1 TABLET EVERY DAY IMMEDIATELY FOLLOWING A MEAL, Disp: 90 tablet, Rfl: 1 .  Multiple Vitamins-Minerals (CENTRUM SILVER 50+MEN) TABS, Take 1 tablet by mouth daily., Disp: , Rfl:  .  nitroGLYCERIN (NITROSTAT) 0.4 MG SL tablet, Place 1 tablet (0.4 mg total) under the tongue every 5 (five) minutes as needed. Chest pain, Disp: 25 tablet, Rfl: 6 .  ondansetron (ZOFRAN) 4 MG tablet, Take 1 tablet (4 mg total) by mouth every 8 (eight) hours as needed for nausea or vomiting. (Patient not taking: Reported on 07/31/2016), Disp: 30  tablet, Rfl: 0 .  oxybutynin (DITROPAN XL) 15 MG 24 hr tablet, Take 15 mg by mouth at bedtime. , Disp: , Rfl:  .  oxymetazoline (AFRIN) 0.05 % nasal spray, Place 1-2 sprays into both nostrils 2 (two) times daily as needed for congestion., Disp: , Rfl:  .  pantoprazole (PROTONIX) 40 MG tablet, TAKE 1 TABLET BY MOUTH EVERY DAY (Patient not taking: Reported on 07/31/2016), Disp: 90 tablet, Rfl: 3 .  pramipexole (MIRAPEX) 1.5 MG tablet, MAY TAKE 1 TO 1 AND 1/2 TABLETS BY MOUTH AT BEDTIME, Disp: 135 tablet, Rfl: 2 .  tamsulosin (FLOMAX) 0.4 MG CAPS capsule, Take 0.4 mg by mouth at bedtime., Disp: , Rfl:  .  temazepam (RESTORIL) 30 MG capsule, Take 1  capsule (30 mg total) by mouth at bedtime as needed for sleep., Disp: 30 capsule, Rfl: 0 .  TOUJEO SOLOSTAR 300 UNIT/ML SOPN, INJECT 20 UNITS INTO THE SKIN DAILY AFTER BREAKFAST., Disp: 15 mL, Rfl: 0 .  Trospium Chloride 60 MG CP24, Take 60 mg by mouth daily., Disp: , Rfl:  .  vitamin B-12 (CYANOCOBALAMIN) 1000 MCG tablet, Take 1,000 mcg by mouth daily., Disp: , Rfl:  .  warfarin (COUMADIN) 5 MG tablet, Take as directed by anticoagulation clinic (Patient taking differently: Take 7.5-10 mg by mouth every evening. Monday and Friday take 10 mg. All other days take 7.5 mg), Disp: 60 tablet, Rfl: 1 No current facility-administered medications for this visit.   Facility-Administered Medications Ordered in Other Visits:  .  testosterone cypionate (DEPOTESTOTERONE CYPIONATE) injection 200 mg, 200 mg, Intramuscular, Q28 days, [name], Gary Sierras, MD, 200 mg at 12/26/11 0850 .  testosterone cypionate (DEPOTESTOTERONE CYPIONATE) injection 200 mg, 200 mg, Intramuscular, Q28 days, [name], Gary Sierras, MD, 200 mg at 01/23/12 0908 .  testosterone cypionate (DEPOTESTOTERONE CYPIONATE) injection 200 mg, 200 mg, Intramuscular, Q28 days, [name], Gary Sierras, MD, 200 mg at 02/25/12 0848 .  testosterone cypionate (DEPOTESTOTERONE CYPIONATE) injection 200 mg, 200 mg, Intramuscular, Q28 days, Gary Post, MD, 200 mg at 06/03/12 1437  Allergies:  Allergies  Allergen Reactions  . Ace Inhibitors Other (See Comments)    cough  . Codeine Nausea Only and Rash       . Penicillins Rash    Childhood allergy Has patient had a PCN reaction causing immediate rash, facial/tongue/throat swelling, SOB or lightheadedness with hypotension: Yes Has patient had a PCN reaction causing severe rash involving mucus membranes or skin necrosis: Yes Has patient had a PCN reaction that required hospitalization No Has patient had a PCN reaction occurring within the last 10 years: No If all of the above answers are "NO", then may  proceed with Cephalosporin use.     Past Medical History, Surgical history, Social history, and Family History were reviewed and updated.  Review of Systems:  As above  Physical Exam:  weight is 228 lb 6.4 oz (103.6 kg). His oral temperature is 98.4 F (36.9 C). His blood pressure is 110/58 (abnormal) and his pulse is 80. His respiration is 17 and oxygen saturation is 98%.   Wt Readings from Last 3 Encounters:  08/23/16 228 lb 6.4 oz (103.6 kg)  07/31/16 227 lb 12.8 oz (103.3 kg)  07/26/16 229 lb (103.9 kg)      Elderly white male. He started to lose his hair. Head and neck exam shows no ocular or oral lesions. There are no palpable cervical or supraclavicular lymph nodes. He has no oral thrush. There is no oral mucositis. There is  no palpable adenopathy in the neck. Lungs are clear to percussion and auscultation bilaterally. Cardiac exam regular rate and rhythm consistent with atrial fibrillation. He has no murmurs, rubs or bruits. Abdomen is soft. He has decent bowel sounds. There is no fluid wave. There is no guarding or rebound tenderness. He has no palpable hepatosplenomegaly. He has a healed right inguinal orchiectomy scar. Extremities shows chronic 2+ edema in his legs. He has some stasis dermatitis changes in his legs. Neurological exam shows no focal neurological deficits. Skin exam shows some brawny edema in his lower legs.  Lab Results  Component Value Date   WBC 2.8 (L) 08/23/2016   HGB 10.4 (L) 08/23/2016   HCT 31.0 (L) 08/23/2016   MCV 92 08/23/2016   PLT 111 (L) 08/23/2016     Chemistry      Component Value Date/Time   NA 141 08/23/2016 1406   NA 137 12/14/2015 1100   K 4.0 08/23/2016 1406   K 3.7 12/14/2015 1100   CL 104 08/23/2016 1406   CO2 27 08/23/2016 1406   CO2 24 12/14/2015 1100   BUN 12 08/23/2016 1406   BUN 18.4 12/14/2015 1100   CREATININE 0.9 08/23/2016 1406   CREATININE 0.9 12/14/2015 1100      Component Value Date/Time   CALCIUM 9.4  08/23/2016 1406   CALCIUM 9.5 12/14/2015 1100   ALKPHOS 65 08/23/2016 1406   ALKPHOS 102 12/14/2015 1100   AST 25 08/23/2016 1406   AST 22 12/14/2015 1100   ALT 11 08/23/2016 1406   ALT 10 12/14/2015 1100   BILITOT 0.70 08/23/2016 1406   BILITOT 0.58 12/14/2015 1100         Impression and Plan: Mr. Panas is an 83 year old white male. He has relapsed large cell non-Hodgkin lymphoma of the testicle. He is on salvage chemotherapy with Rituxan/bendamustine/Velcade. Again, he is not a candidate for aggressive intervention. He definitely is not a candidate for stem cell transplantation.  I am surprised that he is finished radiation already. He had 10 treatments. Hopefully, we will see a very good response.  We do not have to do another PET scan on for about one month. I will get the PET scan set up in late September. I want to get this before he and his family go on vacation.  If we find that he still has residual disease, I will have to think as to what we might be able to do.  At least, his quality of life is doing quite well right now.   I will plan to see him back after he gets back from his vacation, in early October.    Volanda Napoleon, MD 8/17/20183:49 PM

## 2016-08-26 ENCOUNTER — Other Ambulatory Visit: Payer: Self-pay | Admitting: Family Medicine

## 2016-08-26 LAB — IRON AND TIBC
%SAT: 22 % (ref 20–55)
Iron: 61 ug/dL (ref 42–163)
TIBC: 273 ug/dL (ref 202–409)
UIBC: 212 ug/dL (ref 117–376)

## 2016-08-26 LAB — FERRITIN: Ferritin: 77 ng/ml (ref 22–316)

## 2016-08-26 LAB — LACTATE DEHYDROGENASE: LDH: 230 U/L (ref 125–245)

## 2016-09-09 ENCOUNTER — Other Ambulatory Visit: Payer: Self-pay | Admitting: Family Medicine

## 2016-09-10 ENCOUNTER — Encounter: Payer: Self-pay | Admitting: Family Medicine

## 2016-09-10 ENCOUNTER — Ambulatory Visit (INDEPENDENT_AMBULATORY_CARE_PROVIDER_SITE_OTHER): Payer: Medicare Other | Admitting: Family Medicine

## 2016-09-10 VITALS — BP 98/52 | HR 81 | Wt 227.0 lb

## 2016-09-10 DIAGNOSIS — G2581 Restless legs syndrome: Secondary | ICD-10-CM

## 2016-09-10 DIAGNOSIS — I10 Essential (primary) hypertension: Secondary | ICD-10-CM

## 2016-09-10 DIAGNOSIS — Z23 Encounter for immunization: Secondary | ICD-10-CM

## 2016-09-10 DIAGNOSIS — E1165 Type 2 diabetes mellitus with hyperglycemia: Secondary | ICD-10-CM | POA: Diagnosis not present

## 2016-09-10 DIAGNOSIS — G63 Polyneuropathy in diseases classified elsewhere: Secondary | ICD-10-CM

## 2016-09-10 DIAGNOSIS — E1140 Type 2 diabetes mellitus with diabetic neuropathy, unspecified: Secondary | ICD-10-CM | POA: Diagnosis not present

## 2016-09-10 LAB — POCT GLYCOSYLATED HEMOGLOBIN (HGB A1C): HEMOGLOBIN A1C: 5.9

## 2016-09-10 MED ORDER — PRAMIPEXOLE DIHYDROCHLORIDE 1.5 MG PO TABS: ORAL_TABLET | ORAL | 3 refills | Status: DC

## 2016-09-10 NOTE — Progress Notes (Signed)
Subjective:     Patient ID: Gary Manfred., male   DOB: 1933/09/25, 83 y.o.   MRN: [id number]  HPI   Patient seen for routine medical follow-up. He has history of CAD, restless leg syndrome, hypertension, hyperlipidemia, GERD, diabetic peripheral neuropathy, lymphoma.  Just finished up radiation therapy and has pending PET scan follow-up. Generally feels well overall. He had some fatigue issues following radiation therapy but seems to be improving. Has lost about 20 pounds this year and A1c's have improved. Last A1c 6.5%.  He increased his Mirapex to 3 mg daily at bedtime and that seems to working well- he had breakthrough and taking one and one half tablets  Past Medical History:  Diagnosis Date  . Anxiety   . Atrial fibrillation ([place])   . COPD (chronic obstructive pulmonary disease) ([place])    pt. denies  . Coronary artery disease    a. h/o Overlapping stents RCA;  b. 06/2011 Cath: patent stents, nonobs dzs, NL EF.  . Diabetic peripheral neuropathy ([place])   . Diffuse non-Hodgkin's lymphoma of testis ([place]) 09/28/2015  . DM (diabetes mellitus) ([place])    Type 2, peripheral neuropathy.  . Dyspnea    with exertion  . Dysrhythmia   . GERD (gastroesophageal reflux disease)   . Headache   . History of bronchitis   . History of kidney stones   . History of radiation therapy 02/19/16 - 03/13/16   Testis/Scrotum: 32.4 Gy in 18 fractions  . Hyperlipidemia   . Hypertension   . Low testosterone   . Nephrolithiasis   . Osteoarthritis    shoulder  . Restless leg   . SVT (supraventricular tachycardia) ([place])   . Urinary frequency   . Wears partial dentures    upper and lower   Past Surgical History:  Procedure Laterality Date  . APPENDECTOMY    . [place]  . CARDIAC CATHETERIZATION  01/2013  . CATARACT EXTRACTION, BILATERAL    . CHOLECYSTECTOMY    . COLONOSCOPY    . CORONARY ANGIOPLASTY  2004  . EYE SURGERY Bilateral    cataracts  . IR GENERIC HISTORICAL  10/05/2015   IR US GUIDE VASC ACCESS RIGHT 10/05/2015 Marybelle Killings, MD [facility]-INTERV RAD  . IR GENERIC HISTORICAL  10/05/2015   IR FLUORO GUIDE PORT INSERTION RIGHT 10/05/2015 Marybelle Killings, MD [facility]  . LEFT HEART CATHETERIZATION WITH CORONARY ANGIOGRAM N/A 06/18/2011   Procedure: LEFT HEART CATHETERIZATION WITH CORONARY ANGIOGRAM;  Surgeon: Peter M Martinique, MD;  Location: [facility] CATH LAB;  Service: Cardiovascular;  Laterality: N/A;  . LEFT HEART CATHETERIZATION WITH CORONARY ANGIOGRAM N/A 01/27/2013   Procedure: LEFT HEART CATHETERIZATION WITH CORONARY ANGIOGRAM;  Surgeon: Burnell Blanks, MD;  Location[facility] CATH LAB;  Service: Cardiovascular;  Laterality: N/A;  . LUMBAR LAMINECTOMY/DECOMPRESSION MICRODISCECTOMY N/A 02/07/2015   Procedure: Lumbar three-Sacral one Decompression;  Surgeon: Kevan Ny Ditty, MD;  Location: [facility] NEURO ORS;  Service: Neurosurgery;  Laterality: N/A;  L3 to S1 Decompression  . MULTIPLE TOOTH EXTRACTIONS    . ORCHIECTOMY Right 09/01/2015   Procedure: RIGHT ORCHIECTOMY;  Surgeon: Kathie Rhodes, MD;  Location: [facility] ORS;  Service: Urology;  Laterality: Right;  . ROTATOR CUFF REPAIR Left     reports that he quit smoking about 38 years ago. His smoking use included Cigarettes. He has a 30.00 pack-year smoking history. He has never used smokeless tobacco. He reports that he does not drink alcohol or use drugs. family history includes Alzheimer's disease in  his mother; Arthritis in his brother and sister; Coronary artery disease in his unknown relative and unknown relative; Heart disease in his brother, father, mother, and sister; Migraines in his daughter and father; Obesity in his sister and son; Prostate cancer in his brother; Sleep apnea in his son; Thyroid disease in his daughter; Ulcers in his father. Allergies  Allergen Reactions  . Ace Inhibitors Other (See Comments)    cough  . Codeine Nausea Only and Rash       . Penicillins Rash    Childhood allergy Has patient had a PCN reaction  causing immediate rash, facial/tongue/throat swelling, SOB or lightheadedness with hypotension: Yes Has patient had a PCN reaction causing severe rash involving mucus membranes or skin necrosis: Yes Has patient had a PCN reaction that required hospitalization No Has patient had a PCN reaction occurring within the last 10 years: No If all of the above answers are "NO", then may proceed with Cephalosporin use.      Review of Systems  Constitutional: Positive for fatigue. Negative for chills and fever.  Eyes: Negative for visual disturbance.  Respiratory: Negative for cough, chest tightness and shortness of breath.   Cardiovascular: Positive for leg swelling. Negative for chest pain and palpitations.  Genitourinary: Negative for dysuria.  Neurological: Negative for dizziness, syncope, weakness, light-headedness and headaches.       Objective:   Physical Exam  Constitutional: He is oriented to person, place, and time. He appears well-developed and well-nourished.  HENT:  Right Ear: External ear normal.  Left Ear: External ear normal.  Mouth/Throat: Oropharynx is clear and moist.  Eyes: Pupils are equal, round, and reactive to light.  Neck: Neck supple. No thyromegaly present.  Cardiovascular: Normal rate and regular rhythm.   Pulmonary/Chest: Effort normal and breath sounds normal. No respiratory distress. He has no wheezes. He has no rales.  Musculoskeletal:  Only trace edema legs bilaterally  Neurological: He is alert and oriented to person, place, and time.       Assessment:     #1 restless leg syndrome  #2 hypertension stable and at goal  #3 type 2 diabetes with improving control weight loss.    #4 diabetic peripheral neuropathy    Plan:     -Recheck hemoglobin A1c=5.9% -Flu vaccine given -Refill Mirapex 1.5 mg 2 tablets each night -discontinue Humalog to reduce risk of hypoglycemia. -routine follow up in 3 months and sooner prn.  [name] MD [place]  Primary Care at [facility]

## 2016-09-10 NOTE — Patient Instructions (Signed)
Let's drop off the Humalog insulin. Your A1c today was 5.9%.

## 2016-09-11 NOTE — Telephone Encounter (Signed)
Last refill 06/11/16 and last office visit 09/10/16.  Okay to fill?

## 2016-09-11 NOTE — Telephone Encounter (Signed)
I would not recommend taking this in addition to his Mirapex and Gabapentin.

## 2016-09-16 ENCOUNTER — Other Ambulatory Visit: Payer: Self-pay | Admitting: Family Medicine

## 2016-09-24 ENCOUNTER — Other Ambulatory Visit: Payer: Self-pay | Admitting: Family Medicine

## 2016-09-25 ENCOUNTER — Other Ambulatory Visit: Payer: Self-pay | Admitting: Cardiovascular Disease

## 2016-09-26 ENCOUNTER — Encounter: Payer: Self-pay | Admitting: Family Medicine

## 2016-09-26 ENCOUNTER — Ambulatory Visit (HOSPITAL_COMMUNITY): Payer: Medicare Other

## 2016-09-26 ENCOUNTER — Ambulatory Visit: Payer: Medicare Other | Admitting: Radiation Oncology

## 2016-09-29 ENCOUNTER — Other Ambulatory Visit: Payer: Self-pay | Admitting: Family Medicine

## 2016-09-30 ENCOUNTER — Telehealth: Payer: Self-pay | Admitting: Family Medicine

## 2016-09-30 MED ORDER — TEMAZEPAM 30 MG PO CAPS: 30.0000 mg | ORAL_CAPSULE | Freq: Every evening | ORAL | 0 refills | Status: DC | PRN

## 2016-09-30 NOTE — Telephone Encounter (Signed)
Rx called in and patient is aware that he will need to schedule an office visit for more refills.

## 2016-09-30 NOTE — Telephone Encounter (Signed)
I am concerned about risks of interaction with his many medications.  May send in a few Temazepam 15 mg one po qhs for severe insomnia.  #15 with no refills.  May need to set up follow up to discuss.

## 2016-09-30 NOTE — Telephone Encounter (Signed)
Pt would like to have something to make him sleep at night and state that Dr. Sarajane Jews gave him temazepam 30 MG and it worked well for him.  State that he really need something to help him to sleep.  Pharm:  [facility] [place] [facility] [place].   Pt would like to have a call to let him know if this will be done.

## 2016-10-04 ENCOUNTER — Other Ambulatory Visit: Payer: Self-pay | Admitting: General Practice

## 2016-10-04 ENCOUNTER — Other Ambulatory Visit: Payer: Self-pay | Admitting: Family Medicine

## 2016-10-04 MED ORDER — WARFARIN SODIUM 5 MG PO TABS: ORAL_TABLET | ORAL | 1 refills | Status: DC

## 2016-10-07 ENCOUNTER — Ambulatory Visit (HOSPITAL_COMMUNITY)
Admission: RE | Admit: 2016-10-07 | Discharge: 2016-10-07 | Disposition: A | Discharge status: Home / Self Care | Payer: Medicare Other | Source: Ambulatory Visit | Attending: Hematology & Oncology | Admitting: Hematology & Oncology

## 2016-10-07 DIAGNOSIS — S2241XA Multiple fractures of ribs, right side, initial encounter for closed fracture: Secondary | ICD-10-CM | POA: Insufficient documentation

## 2016-10-07 DIAGNOSIS — I2510 Atherosclerotic heart disease of native coronary artery without angina pectoris: Secondary | ICD-10-CM | POA: Insufficient documentation

## 2016-10-07 DIAGNOSIS — C8589 Other specified types of non-Hodgkin lymphoma, extranodal and solid organ sites: Secondary | ICD-10-CM | POA: Diagnosis not present

## 2016-10-07 DIAGNOSIS — E279 Disorder of adrenal gland, unspecified: Secondary | ICD-10-CM | POA: Insufficient documentation

## 2016-10-07 DIAGNOSIS — N281 Cyst of kidney, acquired: Secondary | ICD-10-CM | POA: Insufficient documentation

## 2016-10-07 DIAGNOSIS — I700 Atherosclerosis of aorta: Secondary | ICD-10-CM | POA: Insufficient documentation

## 2016-10-07 DIAGNOSIS — I517 Cardiomegaly: Secondary | ICD-10-CM | POA: Diagnosis not present

## 2016-10-07 DIAGNOSIS — N400 Enlarged prostate without lower urinary tract symptoms: Secondary | ICD-10-CM | POA: Diagnosis not present

## 2016-10-07 DIAGNOSIS — X58XXXA Exposure to other specified factors, initial encounter: Secondary | ICD-10-CM | POA: Diagnosis not present

## 2016-10-07 LAB — GLUCOSE, CAPILLARY: GLUCOSE-CAPILLARY: 121 mg/dL — AB (ref 65–99)

## 2016-10-07 MED ORDER — FLUDEOXYGLUCOSE F - 18 (FDG) INJECTION
11.2700 | Freq: Once | INTRAVENOUS | Status: AC | PRN
  Administered 2016-10-07: 11.27 via INTRAVENOUS

## 2016-10-10 ENCOUNTER — Ambulatory Visit (HOSPITAL_BASED_OUTPATIENT_CLINIC_OR_DEPARTMENT_OTHER): Payer: Medicare Other

## 2016-10-10 ENCOUNTER — Other Ambulatory Visit (HOSPITAL_BASED_OUTPATIENT_CLINIC_OR_DEPARTMENT_OTHER): Payer: Medicare Other

## 2016-10-10 ENCOUNTER — Ambulatory Visit (HOSPITAL_BASED_OUTPATIENT_CLINIC_OR_DEPARTMENT_OTHER): Payer: Medicare Other | Admitting: Hematology & Oncology

## 2016-10-10 VITALS — BP 129/56 | HR 81 | Temp 98.5°F | Resp 18 | Wt 230.0 lb

## 2016-10-10 DIAGNOSIS — E559 Vitamin D deficiency, unspecified: Secondary | ICD-10-CM

## 2016-10-10 DIAGNOSIS — I2510 Atherosclerotic heart disease of native coronary artery without angina pectoris: Secondary | ICD-10-CM | POA: Diagnosis not present

## 2016-10-10 DIAGNOSIS — C8589 Other specified types of non-Hodgkin lymphoma, extranodal and solid organ sites: Secondary | ICD-10-CM

## 2016-10-10 DIAGNOSIS — C8339 Diffuse large B-cell lymphoma, extranodal and solid organ sites: Secondary | ICD-10-CM

## 2016-10-10 DIAGNOSIS — Z95828 Presence of other vascular implants and grafts: Secondary | ICD-10-CM

## 2016-10-10 LAB — CBC WITH DIFFERENTIAL (CANCER CENTER ONLY)
BASO#: 0 10*3/uL (ref 0.0–0.2)
BASO%: 0.5 % (ref 0.0–2.0)
EOS ABS: 0.1 10*3/uL (ref 0.0–0.5)
EOS%: 2.5 % (ref 0.0–7.0)
HEMATOCRIT: 29.6 % — AB (ref 38.7–49.9)
HEMOGLOBIN: 10 g/dL — AB (ref 13.0–17.1)
LYMPH#: 0.8 10*3/uL — ABNORMAL LOW (ref 0.9–3.3)
LYMPH%: 19.3 % (ref 14.0–48.0)
MCH: 31.3 pg (ref 28.0–33.4)
MCHC: 33.8 g/dL (ref 32.0–35.9)
MCV: 93 fL (ref 82–98)
MONO#: 0.5 10*3/uL (ref 0.1–0.9)
MONO%: 11.4 % (ref 0.0–13.0)
NEUT%: 66.3 % (ref 40.0–80.0)
NEUTROS ABS: 2.7 10*3/uL (ref 1.5–6.5)
Platelets: 104 10*3/uL — ABNORMAL LOW (ref 145–400)
RBC: 3.2 10*6/uL — AB (ref 4.20–5.70)
RDW: 15.5 % (ref 11.1–15.7)
WBC: 4 10*3/uL (ref 4.0–10.0)

## 2016-10-10 LAB — CMP (CANCER CENTER ONLY)
ALBUMIN: 3.1 g/dL — AB (ref 3.3–5.5)
ALT(SGPT): 20 U/L (ref 10–47)
AST: 23 U/L (ref 11–38)
Alkaline Phosphatase: 84 U/L (ref 26–84)
BUN, Bld: 15 mg/dL (ref 7–22)
CALCIUM: 9.2 mg/dL (ref 8.0–10.3)
CHLORIDE: 107 meq/L (ref 98–108)
CO2: 26 mEq/L (ref 18–33)
Creat: 0.9 mg/dl (ref 0.6–1.2)
Glucose, Bld: 116 mg/dL (ref 73–118)
POTASSIUM: 4.1 meq/L (ref 3.3–4.7)
Sodium: 141 mEq/L (ref 128–145)
TOTAL PROTEIN: 6.2 g/dL — AB (ref 6.4–8.1)
Total Bilirubin: 0.7 mg/dl (ref 0.20–1.60)

## 2016-10-10 LAB — PROTIME-INR (CHCC SATELLITE)
INR: 3.6 — AB (ref 2.0–3.5)
PROTIME: 43.2 s — AB (ref 10.6–13.4)

## 2016-10-10 MED ORDER — HEPARIN SOD (PORK) LOCK FLUSH 100 UNIT/ML IV SOLN
500.0000 [IU] | Freq: Once | INTRAVENOUS | Status: AC
  Administered 2016-10-10: 500 [IU] via INTRAVENOUS
  Filled 2016-10-10: qty 5

## 2016-10-10 MED ORDER — SODIUM CHLORIDE 0.9 % IJ SOLN
10.0000 mL | Freq: Once | INTRAMUSCULAR | Status: AC
Start: 2016-10-10 — End: 2016-10-10
  Administered 2016-10-10: 10 mL
  Filled 2016-10-10: qty 10

## 2016-10-10 NOTE — Progress Notes (Signed)
Hematology and Oncology Follow Up Visit  Gary Yates [id number] 1933-12-01 83 y.o. 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 He looks quite good. He has not lost any weight. In fact, he is gained a couple pounds.  We did do a PET scan on him. Thankfully, the PET scan showed a very nice response to the left adrenal lymphoma deposits. The SVU went from 34 down to 3. The tumor itself measures 2.9 x 3.7 cm. A previously had measured 6.8 x 5.2 cm.  I told him that if he needed back surgery, that he can have this. I don't see any reason why he couldn't from a hematologic point of view.  He still cannot lie down flat. Again this is from his back.  He is on Coumadin. His INR today was 3.6. This is managed by his primary care doctor.  His appetite still is not that great. However, he is not losing weight.  He's had no fever. Has been no cough. He's had no shortness of breath.  He has chronic leg edema.  Overall, his performance status is ECOG 2.   Medications:  Current Outpatient Prescriptions:  .  ACCU-CHEK AVIVA PLUS test strip, TEST 3 TIMES A DAY (Patient taking differently: TEST 2 TIMES A DAY), Disp: 300 each, Rfl: 5 .  acetaminophen (TYLENOL) 500 MG tablet, Take 1,000 mg by mouth every 6 (six) hours as needed (for pain/fever/headaches.). , Disp: , Rfl:  .  atorvastatin (LIPITOR) 40 MG tablet, TAKE 1 TABLET BY MOUTH DAILY, Disp: 90 tablet, Rfl: 2 .  clopidogrel (PLAVIX) 75 MG tablet, TAKE 1 TABLET BY MOUTH DAILY, Disp: 30 tablet, Rfl: 5 .  docusate sodium (COLACE) 50 MG capsule, Take 1 capsule (50 mg total) by mouth 2 (two) times daily as needed (for constipation).,  Disp: 90 capsule, Rfl: 0 .  furosemide (LASIX) 40 MG tablet, Take 1 tablet (40 mg total) by mouth 2 (two) times daily., Disp: 60 tablet, Rfl: 0 .  gabapentin (NEURONTIN) 300 MG capsule, Take 2 capsules (600 mg total) by mouth 3 (three) times daily., Disp: 540 capsule, Rfl: 2 .  HYDROcodone-acetaminophen (NORCO) 7.5-325 MG tablet, Take 1 tablet by mouth every 4 (four) hours as needed for moderate pain., Disp: , Rfl:  .  KLOR-CON M20 20 MEQ tablet, TAKE 1 TABLET BY MOUTH EVERY DAY, Disp: 90 tablet, Rfl: 0 .  metFORMIN (GLUCOPHAGE) 500 MG tablet, TAKE 1 TABLET BY MOUTH TWICE A DAY WITH A MEAL, Disp: 180 tablet, Rfl: 2 .  metoprolol succinate (TOPROL-XL) 100 MG 24 hr tablet, TAKE 1 TABLET BY MOUTH EVERY DAY IMMEDIATELY FOLLOWING A MEAL, Disp: 90 tablet, Rfl: 1 .  Multiple Vitamins-Minerals (CENTRUM SILVER 50+MEN) TABS, Take 1 tablet by mouth daily., Disp: , Rfl:  .  nitroGLYCERIN (NITROSTAT) 0.4 MG SL tablet, Place 1 tablet (0.4 mg total) under the tongue every 5 (five) minutes as needed. Chest pain, Disp: 25 tablet, Rfl: 6 .  ondansetron (ZOFRAN) 4 MG tablet, Take 1 tablet (4 mg total) by mouth every 8 (eight) hours as needed for nausea or vomiting., Disp: 30 tablet, Rfl: 0 .  oxybutynin (DITROPAN XL) 15 MG 24 hr tablet,  Take 15 mg by mouth at bedtime. , Disp: , Rfl:  .  oxymetazoline (AFRIN) 0.05 % nasal spray, Place 1-2 sprays into both nostrils 2 (two) times daily as needed for congestion., Disp: , Rfl:  .  pantoprazole (PROTONIX) 40 MG tablet, TAKE 1 TABLET BY MOUTH EVERY DAY, Disp: 90 tablet, Rfl: 3 .  pramipexole (MIRAPEX) 1.5 MG tablet, Take two tablets every night., Disp: 180 tablet, Rfl: 3 .  tamsulosin (FLOMAX) 0.4 MG CAPS capsule, Take 0.4 mg by mouth at bedtime., Disp: , Rfl:  .  temazepam (RESTORIL) 30 MG capsule, Take 1 capsule (30 mg total) by mouth at bedtime as needed for sleep., Disp: 15 capsule, Rfl: 0 .  TOUJEO SOLOSTAR 300 UNIT/ML SOPN, INJECT 20 UNITS INTO THE SKIN DAILY AFTER  BREAKFAST., Disp: 15 mL, Rfl: 1 .  Trospium Chloride 60 MG CP24, Take 60 mg by mouth daily., Disp: , Rfl:  .  vitamin B-12 (CYANOCOBALAMIN) 1000 MCG tablet, Take 1,000 mcg by mouth daily., Disp: , Rfl:  .  warfarin (COUMADIN) 5 MG tablet, Take as directed by anticoagulation clinic, Disp: 60 tablet, Rfl: 1 No current facility-administered medications for this visit.   Facility-Administered Medications Ordered in Other Visits:  .  testosterone cypionate (DEPOTESTOTERONE CYPIONATE) injection 200 mg, 200 mg, Intramuscular, Q28 days, [name], Alinda Sierras, MD, 200 mg at 12/26/11 0850 .  testosterone cypionate (DEPOTESTOTERONE CYPIONATE) injection 200 mg, 200 mg, Intramuscular, Q28 days, [name], Alinda Sierras, MD, 200 mg at 01/23/12 0908 .  testosterone cypionate (DEPOTESTOTERONE CYPIONATE) injection 200 mg, 200 mg, Intramuscular, Q28 days, [name], Alinda Sierras, MD, 200 mg at 02/25/12 0848 .  testosterone cypionate (DEPOTESTOTERONE CYPIONATE) injection 200 mg, 200 mg, Intramuscular, Q28 days, [name], MD, 200 mg at 06/03/12 1437  Allergies:  Allergies  Allergen Reactions  . Ace Inhibitors Other (See Comments)    cough  . Codeine Nausea Only and Rash       . Penicillins Rash    Childhood allergy Has patient had a PCN reaction causing immediate rash, facial/tongue/throat swelling, SOB or lightheadedness with hypotension: Yes Has patient had a PCN reaction causing severe rash involving mucus membranes or skin necrosis: Yes Has patient had a PCN reaction that required hospitalization No Has patient had a PCN reaction occurring within the last 10 years: No If all of the above answers are "NO", then may proceed with Cephalosporin use.     Past Medical History, Surgical history, Social history, and Family History were reviewed and updated.  Review of Systems: As stated in the interim history  Physical Exam:  weight is 230 lb (104.3 kg). His oral temperature is 98.5 F (36.9 C). His blood  pressure is 129/56 (abnormal) and his pulse is 81. His respiration is 18 and oxygen saturation is 95%.   Wt Readings from Last 3 Encounters:  10/10/16 230 lb (104.3 kg)  09/10/16 227 lb (103 kg)  08/23/16 228 lb 6.4 oz (103.6 kg)      Elderly white male who is moderately obese. Head and neck exam shows no ocular or oral lesions. There are no palpable cervical or supraclavicular lymph nodes. Lungs are clear bilaterally. Cardiac exam regular rate and rhythm. Has a 1/6 systolic ejection murmur. Abdomen is soft. Abdomen is somewhat obese. There is no fluid wave. There is no guarding or rebound tenderness. He has no palpable abdominal mass. There is no palpable liver or spleen tip. I cannot palpate any inguinal adenopathy bilaterally. Back exam shows no tenderness over the  spine, ribs or hips. He has a well-healed lumbar laminectomy scar. Extremity shows 2-3+ edema in his lower legs that is chronic. Skin exam shows no rashes, ecchymoses or petechia.  Lab Results  Component Value Date   WBC 4.0 10/10/2016   HGB 10.0 (L) 10/10/2016   HCT 29.6 (L) 10/10/2016   MCV 93 10/10/2016   PLT 104 (L) 10/10/2016     Chemistry      Component Value Date/Time   NA 141 10/10/2016 1423   NA 137 12/14/2015 1100   K 4.1 10/10/2016 1423   K 3.7 12/14/2015 1100   CL 107 10/10/2016 1423   CO2 26 10/10/2016 1423   CO2 24 12/14/2015 1100   BUN 15 10/10/2016 1423   BUN 18.4 12/14/2015 1100   CREATININE 0.9 10/10/2016 1423   CREATININE 0.9 12/14/2015 1100      Component Value Date/Time   CALCIUM 9.2 10/10/2016 1423   CALCIUM 9.5 12/14/2015 1100   ALKPHOS 84 10/10/2016 1423   ALKPHOS 102 12/14/2015 1100   AST 23 10/10/2016 1423   AST 22 12/14/2015 1100   ALT 20 10/10/2016 1423   ALT 10 12/14/2015 1100   BILITOT 0.70 10/10/2016 1423   BILITOT 0.58 12/14/2015 1100         Impression and Plan: Gary Yates is an 83 year old white male. He has relapsed large cell non-Hodgkin lymphoma of the testicle. He  was on salvage chemotherapy with Rituxan/bendamustine/Velcade. Again, he is not a candidate for aggressive intervention. He definitely is not a candidate for stem cell transplantation.  I'm very happy that he has had a very nice response.  He clearly is at a higher risk for relapse. As such, I think we have to follow up with a PET scan in about 3 months. I think this would be very helpful.  He has family all agree with this.  I spent about 35 minutes with them. I reviewed the PET scan and his labs. I went over my recommendations. I answered all their questions to their satisfaction. They have a very good understanding of the current situation.  We will plan to get him back in early January 2019.Marland Kitchen    Volanda Napoleon, MD 10/4/20185:19 PM

## 2016-10-10 NOTE — Patient Instructions (Signed)
Implanted Port Home Guide An implanted port is a type of central line that is placed under the skin. Central lines are used to provide IV access when treatment or nutrition needs to be given through a person's veins. Implanted ports are used for long-term IV access. An implanted port may be placed because:  You need IV medicine that would be irritating to the small veins in your hands or arms.  You need long-term IV medicines, such as antibiotics.  You need IV nutrition for a long period.  You need frequent blood draws for lab tests.  You need dialysis.  Implanted ports are usually placed in the chest area, but they can also be placed in the upper arm, the abdomen, or the leg. An implanted port has two main parts:  Reservoir. The reservoir is round and will appear as a small, raised area under your skin. The reservoir is the part where a needle is inserted to give medicines or draw blood.  Catheter. The catheter is a thin, flexible tube that extends from the reservoir. The catheter is placed into a large vein. Medicine that is inserted into the reservoir goes into the catheter and then into the vein.  How will I care for my incision site? Do not get the incision site wet. Bathe or shower as directed by your health care provider. How is my port accessed? Special steps must be taken to access the port:  Before the port is accessed, a numbing cream can be placed on the skin. This helps numb the skin over the port site.  Your health care provider uses a sterile technique to access the port. ? Your health care provider must put on a mask and sterile gloves. ? The skin over your port is cleaned carefully with an antiseptic and allowed to dry. ? The port is gently pinched between sterile gloves, and a needle is inserted into the port.  Only "non-coring" port needles should be used to access the port. Once the port is accessed, a blood return should be checked. This helps ensure that the port  is in the vein and is not clogged.  If your port needs to remain accessed for a constant infusion, a clear (transparent) bandage will be placed over the needle site. The bandage and needle will need to be changed every week, or as directed by your health care provider.  Keep the bandage covering the needle clean and dry. Do not get it wet. Follow your health care provider's instructions on how to take a shower or bath while the port is accessed.  If your port does not need to stay accessed, no bandage is needed over the port.  What is flushing? Flushing helps keep the port from getting clogged. Follow your health care provider's instructions on how and when to flush the port. Ports are usually flushed with saline solution or a medicine called heparin. The need for flushing will depend on how the port is used.  If the port is used for intermittent medicines or blood draws, the port will need to be flushed: ? After medicines have been given. ? After blood has been drawn. ? As part of routine maintenance.  If a constant infusion is running, the port may not need to be flushed.  How long will my port stay implanted? The port can stay in for as long as your health care provider thinks it is needed. When it is time for the port to come out, surgery will be   done to remove it. The procedure is similar to the one performed when the port was put in. When should I seek immediate medical care? When you have an implanted port, you should seek immediate medical care if:  You notice a bad smell coming from the incision site.  You have swelling, redness, or drainage at the incision site.  You have more swelling or pain at the port site or the surrounding area.  You have a fever that is not controlled with medicine.  This information is not intended to replace advice given to you by your health care provider. Make sure you discuss any questions you have with your health care provider. Document  Released: 12/24/2004 Document Revised: 06/01/2015 Document Reviewed: 08/31/2012 [employer] Interactive Patient Education  2017 [employer]  

## 2016-10-11 LAB — LACTATE DEHYDROGENASE: LDH: 201 U/L (ref 125–245)

## 2016-10-16 ENCOUNTER — Encounter: Payer: Self-pay | Admitting: Oncology

## 2016-10-17 ENCOUNTER — Encounter: Payer: Self-pay | Admitting: Radiation Oncology

## 2016-10-17 ENCOUNTER — Ambulatory Visit
Admission: RE | Admit: 2016-10-17 | Discharge: 2016-10-17 | Disposition: A | Discharge status: Home / Self Care | Payer: Medicare Other | Source: Ambulatory Visit | Attending: Radiation Oncology | Admitting: Radiation Oncology

## 2016-10-17 VITALS — BP 100/65 | HR 75 | Temp 98.0°F | Ht 69.0 in | Wt 224.2 lb

## 2016-10-17 DIAGNOSIS — E279 Disorder of adrenal gland, unspecified: Secondary | ICD-10-CM | POA: Insufficient documentation

## 2016-10-17 DIAGNOSIS — C8589 Other specified types of non-Hodgkin lymphoma, extranodal and solid organ sites: Secondary | ICD-10-CM | POA: Diagnosis not present

## 2016-10-17 NOTE — Progress Notes (Signed)
Radiation Oncology         [phone]) [phone] ________________________________  Name: Gary Yates. MRN: [id number]  Date: 10/17/2016  DOB: 25-May-1933  Follow-Up Visit Note  CC: [name], MD  Volanda Napoleon, MD    ICD-10-CM   1. Diffuse non-Hodgkin's lymphoma of testis (HCC) C85.89     Diagnosis:   Relapsed large cell lymphoma of the testis  Interval Since Last Radiation:  2 months  Narrative:  The patient returns today for routine follow-up.  Of note since the patient last visit, he had a PET scan completed on 10/07/2016 with results revealing marked reduction in the size and activity of the left adrenal mass. No new or additional hypermetabolic lesions are identified. Pt presents to the office today accompanied by his wife. He reports that they are doing well overall. He reports continued back pain but has followed up with his doctor and has discussed arthroscopic surgery. He notes that he continues to have issues with lying flat at night secondary to his back pain and this has caused some issues with adequate amounts of sleep. He also reports some ongoing appetite issues. Today, we discussed the results of the PET scan and patient was pleased with the progress.  On review of systems, he reports decreased appetite and difficulty sleeping due to the back pain. Pt denies flank pain.  ALLERGIES:  is allergic to ace inhibitors; codeine; and penicillins.  Meds: Current Outpatient Prescriptions  Medication Sig Dispense Refill  . ACCU-CHEK AVIVA PLUS test strip TEST 3 TIMES A DAY (Patient taking differently: TEST 2 TIMES A DAY) 300 each 5  . acetaminophen (TYLENOL) 500 MG tablet Take 1,000 mg by mouth every 6 (six) hours as needed (for pain/fever/headaches.).     Marland Kitchen atorvastatin (LIPITOR) 40 MG tablet TAKE 1 TABLET BY MOUTH DAILY 90 tablet 2  . clopidogrel (PLAVIX) 75 MG tablet TAKE 1 TABLET BY MOUTH DAILY 30 tablet 5  . docusate sodium (COLACE) 50 MG capsule Take 1 capsule (50  mg total) by mouth 2 (two) times daily as needed (for constipation). 90 capsule 0  . furosemide (LASIX) 40 MG tablet Take 1 tablet (40 mg total) by mouth 2 (two) times daily. 60 tablet 0  . gabapentin (NEURONTIN) 300 MG capsule Take 2 capsules (600 mg total) by mouth 3 (three) times daily. 540 capsule 2  . HYDROcodone-acetaminophen (NORCO) 7.5-325 MG tablet Take 1 tablet by mouth every 4 (four) hours as needed for moderate pain.    Marland Kitchen KLOR-CON M20 20 MEQ tablet TAKE 1 TABLET BY MOUTH EVERY DAY 90 tablet 0  . metFORMIN (GLUCOPHAGE) 500 MG tablet TAKE 1 TABLET BY MOUTH TWICE A DAY WITH A MEAL 180 tablet 2  . metoprolol succinate (TOPROL-XL) 100 MG 24 hr tablet TAKE 1 TABLET BY MOUTH EVERY DAY IMMEDIATELY FOLLOWING A MEAL 90 tablet 1  . Multiple Vitamins-Minerals (CENTRUM SILVER 50+MEN) TABS Take 1 tablet by mouth daily.    . nitroGLYCERIN (NITROSTAT) 0.4 MG SL tablet Place 1 tablet (0.4 mg total) under the tongue every 5 (five) minutes as needed. Chest pain 25 tablet 6  . ondansetron (ZOFRAN) 4 MG tablet Take 1 tablet (4 mg total) by mouth every 8 (eight) hours as needed for nausea or vomiting. 30 tablet 0  . oxybutynin (DITROPAN XL) 15 MG 24 hr tablet Take 15 mg by mouth at bedtime.     Marland Kitchen oxymetazoline (AFRIN) 0.05 % nasal spray Place 1-2 sprays into both nostrils 2 (two) times  daily as needed for congestion.    . pantoprazole (PROTONIX) 40 MG tablet TAKE 1 TABLET BY MOUTH EVERY DAY 90 tablet 3  . pramipexole (MIRAPEX) 1.5 MG tablet Take two tablets every night. 180 tablet 3  . tamsulosin (FLOMAX) 0.4 MG CAPS capsule Take 0.4 mg by mouth at bedtime.    . temazepam (RESTORIL) 30 MG capsule Take 1 capsule (30 mg total) by mouth at bedtime as needed for sleep. 15 capsule 0  . TOUJEO SOLOSTAR 300 UNIT/ML SOPN INJECT 20 UNITS INTO THE SKIN DAILY AFTER BREAKFAST. 15 mL 1  . Trospium Chloride 60 MG CP24 Take 60 mg by mouth daily.    . vitamin B-12 (CYANOCOBALAMIN) 1000 MCG tablet Take 1,000 mcg by mouth  daily.    Marland Kitchen warfarin (COUMADIN) 5 MG tablet Take as directed by anticoagulation clinic 60 tablet 1   No current facility-administered medications for this encounter.    Facility-Administered Medications Ordered in Other Encounters  Medication Dose Route Frequency Provider Last Rate Last Dose  . testosterone cypionate (DEPOTESTOTERONE CYPIONATE) injection 200 mg  200 mg Intramuscular Q28 days [name], MD   200 mg at 12/26/11 0850  . testosterone cypionate (DEPOTESTOTERONE CYPIONATE) injection 200 mg  200 mg Intramuscular Q28 days [name], MD   200 mg at 01/23/12 0908  . testosterone cypionate (DEPOTESTOTERONE CYPIONATE) injection 200 mg  200 mg Intramuscular Q28 days [name], MD   200 mg at 02/25/12 0848  . testosterone cypionate (DEPOTESTOTERONE CYPIONATE) injection 200 mg  200 mg Intramuscular Q28 days [name], MD   200 mg at 06/03/12 1437    Physical Findings: The patient is in no acute distress. Patient is alert and oriented.  height is 5\' 9"  (1.753 m) and weight is 224 lb 3.2 oz (101.7 kg). His oral temperature is 98 F (36.7 C). His blood pressure is 100/65 and his pulse is 75. His oxygen saturation is 98%. .    Lungs are clear to auscultation bilaterally. Heart has regular rate and rhythm. No palpable cervical, supraclavicular, or axillary adenopathy. Abdomen soft, non-tender, normal bowel sounds.   Lab Findings: Lab Results  Component Value Date   WBC 4.0 10/10/2016   HGB 10.0 (L) 10/10/2016   HCT 29.6 (L) 10/10/2016   MCV 93 10/10/2016   PLT 104 (L) 10/10/2016    Radiographic Findings: Nm Pet Image Restag (ps) Skull Base To Thigh  Result Date: 10/07/2016 CLINICAL DATA:  Subsequent treatment strategy for non-Hodgkin's lymphoma. EXAM: NUCLEAR MEDICINE PET SKULL BASE TO THIGH TECHNIQUE: 11.3 mCi F-18 FDG was injected intravenously. Full-ring PET imaging was performed from the skull base to thigh after the radiotracer. CT data was  obtained and used for attenuation correction and anatomic localization. FASTING BLOOD GLUCOSE:  Value: 121 mg/dl COMPARISON:  Multiple exams, including 07/19/2016 FINDINGS: NECK No hypermetabolic lymph nodes in the neck. Bilateral carotid atherosclerotic vascular disease. CHEST No hypermetabolic mediastinal or hilar nodes. No suspicious pulmonary nodules on the CT scan. Scarring posteriorly in the lingula. 2-3 mm right apical pulmonary nodule, not hypermetabolic. Right Port-A-Cath tip:  Right atrium. Coronary, aortic arch, and branch vessel atherosclerotic vascular disease. Mild cardiomegaly. ABDOMEN/PELVIS The indistinctly marginated left adrenal mass measures 2.9 by 3.7 cm on image 111/4 (formerly 6.8 by 5.2 cm) and has a maximum standard uptake value of 3.1 (formerly 34.1). Cholecystectomy. Bilateral photopenic renal cysts. Aortoiliac atherosclerotic vascular disease. Prostatomegaly without significant abnormal hypermetabolic prostate activity. Small umbilical hernia contains adipose tissue. SKELETON No focal  hypermetabolic activity to suggest skeletal metastasis. Nonunited displaced fractures the right tenth and eleventh ribs. IMPRESSION: 1. Marked reduction in size and activity of the left adrenal mass, current maximum SUV is 3.1 (formerly 34.1). No new or additional hypermetabolic lesions are identified. 2. Other imaging findings of potential clinical significance: Aortic Atherosclerosis (ICD10-I70.0). Coronary atherosclerosis with mild cardiomegaly. Bilateral photopenic renal cysts. Prostatomegaly. Nonunited displaced fractures of the right tenth and eleventh ribs. Electronically Signed   By: Van Clines M.D.   On: 10/07/2016 12:19    Impression:  The patient is recovering from the effects of radiation.  Pt is doing well clinically. PET scan show significant shrinkage of the adrenal mass. The SUV has decreased significantly. There are no new problems.   Plan:  Patient is to follow up with  radiation oncology as needed. He will continue to follow up with medication oncology closely.   ____________________________________ Marlowe Kays   -----------------------------------  Blair Promise, PhD, MD   This document serves as a record of services personally performed by [name], MD. It was created on her behalf by Marlowe Kays, a trained medical scribe. The creation of this record is based on the scribe's personal observations and the provider's statements to them. This document has been checked and approved by the attending provider.

## 2016-10-17 NOTE — Progress Notes (Signed)
Gary Yates is here for follow up after treatment to a left adrenal gland mass.  He denies having pain or urinary/bowel issues.  He reports having occasional nausea and takes Zofran PRN.  He reports having some fatigue and said his appetite comes and goes.  BP 100/65 (BP Location: Right Arm, Patient Position: Sitting)   Pulse 75   Temp 98 F (36.7 C) (Oral)   Ht 5\' 9"  (1.753 m)   Wt 224 lb 3.2 oz (101.7 kg)   SpO2 98%   BMI 33.11 kg/m    Wt Readings from Last 3 Encounters:  10/17/16 224 lb 3.2 oz (101.7 kg)  10/10/16 230 lb (104.3 kg)  09/10/16 227 lb (103 kg)

## 2016-10-30 LAB — HM DIABETES EYE EXAM

## 2016-11-01 ENCOUNTER — Other Ambulatory Visit: Payer: Self-pay | Admitting: Family Medicine

## 2016-11-03 ENCOUNTER — Other Ambulatory Visit: Payer: Self-pay | Admitting: Family Medicine

## 2016-11-05 ENCOUNTER — Encounter: Payer: Self-pay | Admitting: Family Medicine

## 2016-11-30 ENCOUNTER — Other Ambulatory Visit: Payer: Self-pay | Admitting: Family Medicine

## 2016-12-03 ENCOUNTER — Other Ambulatory Visit: Payer: Self-pay | Admitting: General Practice

## 2016-12-03 MED ORDER — WARFARIN SODIUM 5 MG PO TABS: ORAL_TABLET | ORAL | 1 refills | Status: DC

## 2016-12-03 NOTE — Telephone Encounter (Signed)
Thanks Hoyle Sauer,  Patient's INR is checked regularly by Dr. Marin Olp.

## 2016-12-07 ENCOUNTER — Other Ambulatory Visit: Payer: Self-pay | Admitting: Family Medicine

## 2016-12-10 ENCOUNTER — Ambulatory Visit: Payer: Medicare Other | Admitting: Family Medicine

## 2016-12-10 ENCOUNTER — Encounter: Payer: Self-pay | Admitting: Family Medicine

## 2016-12-10 ENCOUNTER — Ambulatory Visit (INDEPENDENT_AMBULATORY_CARE_PROVIDER_SITE_OTHER): Payer: Medicare Other | Admitting: General Practice

## 2016-12-10 VITALS — BP 110/64 | HR 74 | Temp 97.9°F | Wt 224.5 lb

## 2016-12-10 DIAGNOSIS — G4700 Insomnia, unspecified: Secondary | ICD-10-CM | POA: Diagnosis not present

## 2016-12-10 DIAGNOSIS — E1165 Type 2 diabetes mellitus with hyperglycemia: Secondary | ICD-10-CM | POA: Diagnosis not present

## 2016-12-10 DIAGNOSIS — E1140 Type 2 diabetes mellitus with diabetic neuropathy, unspecified: Secondary | ICD-10-CM | POA: Diagnosis not present

## 2016-12-10 DIAGNOSIS — I480 Paroxysmal atrial fibrillation: Secondary | ICD-10-CM

## 2016-12-10 DIAGNOSIS — Z7901 Long term (current) use of anticoagulants: Secondary | ICD-10-CM

## 2016-12-10 LAB — POCT INR
INR: 4
INR: 4

## 2016-12-10 LAB — POCT GLYCOSYLATED HEMOGLOBIN (HGB A1C): HEMOGLOBIN A1C: 6.1

## 2016-12-10 NOTE — Progress Notes (Signed)
Subjective:     Patient ID: Gary Yates., male   DOB: October 01, 1933, 83 y.o.   MRN: [id number]  HPI Patient seen for medical follow-up. He has history of non-Hodgkin lymphoma and has pending follow-up PET scan. He's lost a couple pounds since last visit. At one point, he was up to 287 pounds down to current weight of 224 pounds.  Type 2 diabetes which is improved with his weight loss. We discontinued Humalog last visit. He remains on long-acting insulin 20 units daily and metformin. Fasting blood sugars usually low 100s. No significant hypoglycemia.  Chronic insomnia. He has restless leg syndrome. He has difficulty falling asleep and partly relates to back pain but also states he cannot "turn his mind off ".  Wife states he takes multiple small naps per day.  No regular ETOH.  Minimal caffeine.    Remains on Coumadin. No recent bleeding complications.  Past Medical History:  Diagnosis Date  . Anxiety   . Atrial fibrillation ([place])   . COPD (chronic obstructive pulmonary disease) ([place])    pt. denies  . Coronary artery disease    a. h/o Overlapping stents RCA;  b. 06/2011 Cath: patent stents, nonobs dzs, NL EF.  . Diabetic peripheral neuropathy ([place])   . Diffuse non-Hodgkin's lymphoma of testis ([place]) 09/28/2015  . DM (diabetes mellitus) ([place])    Type 2, peripheral neuropathy.  . Dyspnea    with exertion  . Dysrhythmia   . GERD (gastroesophageal reflux disease)   . Headache   . History of bronchitis   . History of kidney stones   . History of radiation therapy 02/19/16 - 03/13/16   Testis/Scrotum: 32.4 Gy in 18 fractions  . History of radiation therapy 08/07/16-08/20/16   left adrenal gland mass treated to 30 Gy in 10 fractions  . Hyperlipidemia   . Hypertension   . Low testosterone   . Nephrolithiasis   . Osteoarthritis    shoulder  . Restless leg   . SVT (supraventricular tachycardia) ([place])   . Urinary frequency   . Wears partial dentures    upper and lower   Past Surgical  History:  Procedure Laterality Date  . APPENDECTOMY    . [place]  . CARDIAC CATHETERIZATION  01/2013  . CATARACT EXTRACTION, BILATERAL    . CHOLECYSTECTOMY    . COLONOSCOPY    . CORONARY ANGIOPLASTY  2004  . EYE SURGERY Bilateral    cataracts  . IR GENERIC HISTORICAL  10/05/2015   IR US GUIDE VASC ACCESS RIGHT 10/05/2015 Marybelle Killings, MD [facility]INTER[facility]  . IR GENERIC HISTORICAL  10/05/2015   IR FLUORO GUIDE PORT INSERTION RIGHT 10/05/2015 Marybelle Killings, MD [facility]  . LEFT HEART CATHETERIZATION WITH CORONARY ANGIOGRAM N/A 06/18/2011   Procedure: LEFT HEART CATHETERIZATION WITH CORONARY ANGIOGRAM;  Surgeon: Peter M Martinique, MD;  Location: [facility] CATH LAB;  Service: Cardiovascular;  Laterality: N/A;  . LEFT HEART CATHETERIZATION WITH CORONARY ANGIOGRAM N/A 01/27/2013   Procedure: LEFT HEART CATHETERIZATION WITH CORONARY ANGIOGRAM;  Surgeon: Burnell Blanks, MD;  Location: [facility] CATH LAB;  Service: Cardiovascular;  Laterality: N/A;  . LUMBAR LAMINECTOMY/DECOMPRESSION MICRODISCECTOMY N/A 02/07/2015   Procedure: Lumbar three-Sacral one Decompression;  Surgeon: Kevan Ny Ditty, MD;  Location: [facility]O [facility];  Service: Neurosurgery;  Laterality: N/A;  L3 to S1 Decompression  . MULTIPLE TOOTH EXTRACTIONS    . ORCHIECTOMY Right 09/01/2015   Procedure: RIGHT ORCHIECTOMY;  Surgeon: Kathie Rhodes, MD;  Location: [facility] ORS;  Service: Urology;  Laterality: Right;  . ROTATOR CUFF REPAIR Left     reports that he quit smoking about 38 years ago. His smoking use included cigarettes. He has a 30.00 pack-year smoking history. he has never used smokeless tobacco. He reports that he does not drink alcohol or use drugs. family history includes Alzheimer's disease in his mother; Arthritis in his brother and sister; Coronary artery disease in his unknown relative and unknown relative; Heart disease in his brother, father, mother, and sister; Migraines in his daughter and father; Obesity in his sister and  son; Prostate cancer in his brother; Sleep apnea in his son; Thyroid disease in his daughter; Ulcers in his father. Allergies  Allergen Reactions  . Ace Inhibitors Other (See Comments)    cough  . Codeine Nausea Only and Rash       . Penicillins Rash    Childhood allergy Has patient had a PCN reaction causing immediate rash, facial/tongue/throat swelling, SOB or lightheadedness with hypotension: Yes Has patient had a PCN reaction causing severe rash involving mucus membranes or skin necrosis: Yes Has patient had a PCN reaction that required hospitalization No Has patient had a PCN reaction occurring within the last 10 years: No If all of the above answers are "NO", then may proceed with Cephalosporin use.      Review of Systems  Constitutional: Negative for appetite change, chills, fever and unexpected weight change.  Eyes: Negative for visual disturbance.  Respiratory: Negative for cough, chest tightness and shortness of breath.   Cardiovascular: Negative for chest pain, palpitations and leg swelling.  Neurological: Negative for dizziness, syncope, weakness, light-headedness and headaches.  Psychiatric/Behavioral: Positive for sleep disturbance. Negative for dysphoric mood.       Objective:   Physical Exam  Constitutional: He is oriented to person, place, and time. He appears well-developed and well-nourished.  HENT:  Right Ear: External ear normal.  Left Ear: External ear normal.  Mouth/Throat: Oropharynx is clear and moist.  Eyes: Pupils are equal, round, and reactive to light.  Neck: Neck supple. No thyromegaly present.  Cardiovascular: Normal rate.  Pulmonary/Chest: Effort normal and breath sounds normal. No respiratory distress. He has no wheezes. He has no rales.  Musculoskeletal:  Support hose on both legs.  Neurological: He is alert and oriented to person, place, and time.  Psychiatric: He has a normal mood and affect. His behavior is normal.       Assessment:      #1 type 2 diabetes well controlled with A1c 6.1%  #2 history of chronic insomnia  #3 history of atrial fibrillation on chronic Coumadin    Plan:     -Continue current insulin and diabetes regimen and reassess in 3 months.  May need to slowly titrate back his Toujeo for any further weight loss.   -Patient was given updated instructions on his Coumadin. His INR today was 4.0 -Sleep hygiene discussed at some length. Avoid daytime naps. Consider trial of over-the-counter melatonin  [name] MD [facility]

## 2016-12-10 NOTE — Patient Instructions (Addendum)

## 2016-12-10 NOTE — Patient Instructions (Addendum)
Pre visit review using our clinic review tool, if applicable. No additional management support is needed unless otherwise documented below in the visit note.  Hold coumadin today and then change dosage and start taking 1 1/2 tablets daily.  Re-check in 2 weeks.

## 2016-12-23 ENCOUNTER — Ambulatory Visit (INDEPENDENT_AMBULATORY_CARE_PROVIDER_SITE_OTHER): Payer: Medicare Other | Admitting: General Practice

## 2016-12-23 DIAGNOSIS — I480 Paroxysmal atrial fibrillation: Secondary | ICD-10-CM | POA: Diagnosis not present

## 2016-12-23 DIAGNOSIS — Z7901 Long term (current) use of anticoagulants: Secondary | ICD-10-CM

## 2016-12-23 LAB — POCT INR: INR: 3.6

## 2016-12-23 NOTE — Patient Instructions (Addendum)
Pre visit review using our clinic review tool, if applicable. No additional management support is needed unless otherwise documented below in the visit note.  Hold coumadin today (12/17) and then change dosage and start taking 1 1/2 tablets daily except 1 tablet on Wednesdays. Re-check in 3 weeks.

## 2017-01-08 ENCOUNTER — Ambulatory Visit (HOSPITAL_COMMUNITY)
Admission: RE | Admit: 2017-01-08 | Discharge: 2017-01-08 | Disposition: A | Discharge status: Home / Self Care | Payer: Medicare Other | Source: Ambulatory Visit | Attending: Hematology & Oncology | Admitting: Hematology & Oncology

## 2017-01-08 DIAGNOSIS — C8589 Other specified types of non-Hodgkin lymphoma, extranodal and solid organ sites: Secondary | ICD-10-CM | POA: Insufficient documentation

## 2017-01-08 DIAGNOSIS — R590 Localized enlarged lymph nodes: Secondary | ICD-10-CM | POA: Diagnosis not present

## 2017-01-08 DIAGNOSIS — E559 Vitamin D deficiency, unspecified: Secondary | ICD-10-CM | POA: Insufficient documentation

## 2017-01-08 LAB — GLUCOSE, CAPILLARY: Glucose-Capillary: 107 mg/dL — ABNORMAL HIGH (ref 65–99)

## 2017-01-08 MED ORDER — FLUDEOXYGLUCOSE F - 18 (FDG) INJECTION
11.0100 | Freq: Once | INTRAVENOUS | Status: AC | PRN
  Administered 2017-01-08: 11.01 via INTRAVENOUS

## 2017-01-10 ENCOUNTER — Ambulatory Visit (HOSPITAL_BASED_OUTPATIENT_CLINIC_OR_DEPARTMENT_OTHER): Payer: Medicare Other

## 2017-01-10 ENCOUNTER — Ambulatory Visit (HOSPITAL_BASED_OUTPATIENT_CLINIC_OR_DEPARTMENT_OTHER): Payer: Medicare Other | Admitting: Hematology & Oncology

## 2017-01-10 ENCOUNTER — Encounter: Payer: Self-pay | Admitting: Hematology & Oncology

## 2017-01-10 ENCOUNTER — Other Ambulatory Visit: Payer: Self-pay

## 2017-01-10 ENCOUNTER — Other Ambulatory Visit (HOSPITAL_BASED_OUTPATIENT_CLINIC_OR_DEPARTMENT_OTHER): Payer: Medicare Other

## 2017-01-10 VITALS — BP 123/58 | HR 73 | Temp 97.8°F | Resp 18 | Wt 232.5 lb

## 2017-01-10 DIAGNOSIS — C8589 Other specified types of non-Hodgkin lymphoma, extranodal and solid organ sites: Secondary | ICD-10-CM

## 2017-01-10 DIAGNOSIS — C8339 Diffuse large B-cell lymphoma, extranodal and solid organ sites: Secondary | ICD-10-CM

## 2017-01-10 DIAGNOSIS — Z452 Encounter for adjustment and management of vascular access device: Secondary | ICD-10-CM | POA: Diagnosis not present

## 2017-01-10 DIAGNOSIS — E559 Vitamin D deficiency, unspecified: Secondary | ICD-10-CM

## 2017-01-10 DIAGNOSIS — Z7901 Long term (current) use of anticoagulants: Secondary | ICD-10-CM

## 2017-01-10 DIAGNOSIS — M4727 Other spondylosis with radiculopathy, lumbosacral region: Secondary | ICD-10-CM

## 2017-01-10 LAB — CMP (CANCER CENTER ONLY)
ALT: 18 U/L (ref 10–47)
AST: 23 U/L (ref 11–38)
Albumin: 3.4 g/dL (ref 3.3–5.5)
Alkaline Phosphatase: 112 U/L — ABNORMAL HIGH (ref 26–84)
BUN: 19 mg/dL (ref 7–22)
CHLORIDE: 104 meq/L (ref 98–108)
CO2: 27 mEq/L (ref 18–33)
Calcium: 9.4 mg/dL (ref 8.0–10.3)
Creat: 1.1 mg/dl (ref 0.6–1.2)
GLUCOSE: 132 mg/dL — AB (ref 73–118)
POTASSIUM: 4.4 meq/L (ref 3.3–4.7)
Sodium: 141 mEq/L (ref 128–145)
Total Bilirubin: 0.5 mg/dl (ref 0.20–1.60)
Total Protein: 6.5 g/dL (ref 6.4–8.1)

## 2017-01-10 LAB — CBC WITH DIFFERENTIAL (CANCER CENTER ONLY)
BASO#: 0 10*3/uL (ref 0.0–0.2)
BASO%: 0.5 % (ref 0.0–2.0)
EOS ABS: 0.1 10*3/uL (ref 0.0–0.5)
EOS%: 3 % (ref 0.0–7.0)
HCT: 31.5 % — ABNORMAL LOW (ref 38.7–49.9)
HGB: 10.5 g/dL — ABNORMAL LOW (ref 13.0–17.1)
LYMPH#: 0.9 10*3/uL (ref 0.9–3.3)
LYMPH%: 20.3 % (ref 14.0–48.0)
MCH: 31.5 pg (ref 28.0–33.4)
MCHC: 33.3 g/dL (ref 32.0–35.9)
MCV: 95 fL (ref 82–98)
MONO#: 0.5 10*3/uL (ref 0.1–0.9)
MONO%: 11.9 % (ref 0.0–13.0)
NEUT#: 2.8 10*3/uL (ref 1.5–6.5)
NEUT%: 64.3 % (ref 40.0–80.0)
PLATELETS: 123 10*3/uL — AB (ref 145–400)
RBC: 3.33 10*6/uL — ABNORMAL LOW (ref 4.20–5.70)
RDW: 14.2 % (ref 11.1–15.7)
WBC: 4.4 10*3/uL (ref 4.0–10.0)

## 2017-01-10 LAB — LACTATE DEHYDROGENASE: LDH: 189 U/L (ref 125–245)

## 2017-01-10 MED ORDER — SODIUM CHLORIDE 0.9% FLUSH
10.0000 mL | INTRAVENOUS | Status: DC | PRN
  Administered 2017-01-10: 10 mL via INTRAVENOUS
  Filled 2017-01-10: qty 10

## 2017-01-10 MED ORDER — HEPARIN SOD (PORK) LOCK FLUSH 100 UNIT/ML IV SOLN
500.0000 [IU] | Freq: Once | INTRAVENOUS | Status: AC
  Administered 2017-01-10: 500 [IU] via INTRAVENOUS
  Filled 2017-01-10: qty 5

## 2017-01-10 NOTE — Progress Notes (Signed)
Hematology and Oncology Follow Up Visit  Gary Yates [id number] 10/20/1933 84 y.o. 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 He looks quite good. He has not lost any weight. In fact, he is gained a couple pounds.  Unfortunately, his lymphoma has yet again come back.  We did do a PET scan on him.  This was done 2 days ago.  The PET scan showed new left-sided hypermetabolic retroperitoneal lymphadenopathy.  Where he had the left adrenal lesion, this is no longer hypermetabolic from past radiation.  He certainly does not have a lot of of disease bulk.  His back is clearly his biggest problem.  He really needs surgery for this but there is no way surgeon will touch him given that he has lymphoma.  He has had no fever.  He has had no nausea or vomiting.  He has had no increased cough.  He does have chronic leg swelling.  He has had no fever.  He has had no change in bowel or bladder habits.  There is been no bleeding.  Thankfully, he comes in with his son.  His wife has an element of dementia.  He did enjoy the holidays.  I am glad that he is able to enjoy the holidays.  Overall, his performance status is ECOG 1.   Medications:  Current Outpatient Medications:  .  ACCU-CHEK AVIVA PLUS test strip, TEST 3 TIMES A DAY (Patient taking differently: TEST 2 TIMES A DAY), Disp: 300 each, Rfl: 5 .  acetaminophen (TYLENOL) 500 MG tablet, Take 1,000 mg by mouth every 6 (six) hours as needed (for pain/fever/headaches.). , Disp: , Rfl:  .  atorvastatin (LIPITOR) 40 MG tablet, TAKE 1 TABLET BY MOUTH DAILY, Disp: 90 tablet, Rfl: 2 .   clopidogrel (PLAVIX) 75 MG tablet, TAKE 1 TABLET BY MOUTH DAILY, Disp: 30 tablet, Rfl: 5 .  docusate sodium (COLACE) 50 MG capsule, Take 1 capsule (50 mg total) by mouth 2 (two) times daily as needed (for constipation)., Disp: 90 capsule, Rfl: 0 .  furosemide (LASIX) 40 MG tablet, Take 1 tablet (40 mg total) by mouth 2 (two) times daily., Disp: 60 tablet, Rfl: 0 .  gabapentin (NEURONTIN) 300 MG capsule, Take 2 capsules (600 mg total) by mouth 3 (three) times daily., Disp: 540 capsule, Rfl: 2 .  KLOR-CON M20 20 MEQ tablet, TAKE 1 TABLET EVERY DAY, Disp: 90 tablet, Rfl: 0 .  metFORMIN (GLUCOPHAGE) 500 MG tablet, TAKE 1 TABLET BY MOUTH TWICE A DAY WITH A MEAL, Disp: 180 tablet, Rfl: 2 .  metoprolol succinate (TOPROL-XL) 100 MG 24 hr tablet, TAKE 1 TABLET BY MOUTH EVERY DAY IMMEDIATELY FOLLOWING A MEAL, Disp: 90 tablet, Rfl: 1 .  Multiple Vitamins-Minerals (CENTRUM SILVER 50+MEN) TABS, Take 1 tablet by mouth daily., Disp: , Rfl:  .  nitroGLYCERIN (NITROSTAT) 0.4 MG SL tablet, Place 1 tablet (0.4 mg total) under the tongue every 5 (five) minutes as needed. Chest pain, Disp: 25 tablet, Rfl: 6 .  ondansetron (ZOFRAN) 4 MG tablet, Take 1 tablet (4 mg total) by mouth every 8 (eight) hours as needed for nausea or  vomiting., Disp: 30 tablet, Rfl: 0 .  oxybutynin (DITROPAN XL) 15 MG 24 hr tablet, Take 15 mg by mouth at bedtime. , Disp: , Rfl:  .  oxymetazoline (AFRIN) 0.05 % nasal spray, Place 1-2 sprays into both nostrils 2 (two) times daily as needed for congestion., Disp: , Rfl:  .  pantoprazole (PROTONIX) 40 MG tablet, TAKE 1 TABLET BY MOUTH EVERY DAY, Disp: 90 tablet, Rfl: 3 .  pramipexole (MIRAPEX) 1.5 MG tablet, Take two tablets every night., Disp: 180 tablet, Rfl: 3 .  tamsulosin (FLOMAX) 0.4 MG CAPS capsule, Take 0.4 mg by mouth at bedtime., Disp: , Rfl:  .  temazepam (RESTORIL) 30 MG capsule, TAKE ONE CAPSULE AT BEDTIME AS NEEDED SLEEP, Disp: 15 capsule, Rfl: 0 .  TOUJEO SOLOSTAR 300 UNIT/ML SOPN,  INJECT 20 UNITS INTO THE SKIN DAILY AFTER BREAKFAST., Disp: 15 mL, Rfl: 1 .  Trospium Chloride 60 MG CP24, Take 60 mg by mouth daily., Disp: , Rfl:  .  vitamin B-12 (CYANOCOBALAMIN) 1000 MCG tablet, Take 1,000 mcg by mouth daily., Disp: , Rfl:  .  warfarin (COUMADIN) 5 MG tablet, Take as directed by anticoagulation clinic, Disp: 60 tablet, Rfl: 1 No current facility-administered medications for this visit.   Facility-Administered Medications Ordered in Other Visits:  .  testosterone cypionate (DEPOTESTOTERONE CYPIONATE) injection 200 mg, 200 mg, Intramuscular, Q28 days, [name], Alinda Sierras, MD, 200 mg at 12/26/11 0850 .  testosterone cypionate (DEPOTESTOTERONE CYPIONATE) injection 200 mg, 200 mg, Intramuscular, Q28 days, [name], Alinda Sierras, MD, 200 mg at 01/23/12 0908 .  testosterone cypionate (DEPOTESTOTERONE CYPIONATE) injection 200 mg, 200 mg, Intramuscular, Q28 days, [name], Alinda Sierras, MD, 200 mg at 02/25/12 0848 .  testosterone cypionate (DEPOTESTOTERONE CYPIONATE) injection 200 mg, 200 mg, Intramuscular, Q28 days, [name], MD, 200 mg at 06/03/12 1437  Allergies:  Allergies  Allergen Reactions  . Ace Inhibitors Other (See Comments)    cough  . Codeine Nausea Only and Rash       . Penicillins Rash    Childhood allergy Has patient had a PCN reaction causing immediate rash, facial/tongue/throat swelling, SOB or lightheadedness with hypotension: Yes Has patient had a PCN reaction causing severe rash involving mucus membranes or skin necrosis: Yes Has patient had a PCN reaction that required hospitalization No Has patient had a PCN reaction occurring within the last 10 years: No If all of the above answers are "NO", then may proceed with Cephalosporin use.     Past Medical History, Surgical history, Social history, and Family History were reviewed and updated.  Review of Systems: As stated in the interim history  Physical Exam:  weight is 232 lb 8 oz (105.5 kg). His  oral temperature is 97.8 F (36.6 C). His blood pressure is 123/58 (abnormal) and his pulse is 73. His respiration is 18 and oxygen saturation is 98%.   Wt Readings from Last 3 Encounters:  01/10/17 232 lb 8 oz (105.5 kg)  12/10/16 224 lb 8 oz (101.8 kg)  10/17/16 224 lb 3.2 oz (101.7 kg)      Physical Exam  Constitutional: He is oriented to person, place, and time.  HENT:  Head: Normocephalic and atraumatic.  Mouth/Throat: Oropharynx is clear and moist.  Eyes: EOM are normal. Pupils are equal, round, and reactive to light.  Neck: Normal range of motion.  Cardiovascular: Normal rate, regular rhythm and normal heart sounds.  Pulmonary/Chest: Effort normal and breath sounds normal.  Abdominal: Soft. Bowel sounds are normal.  Musculoskeletal: Normal  range of motion. He exhibits no edema, tenderness or deformity.  Lymphadenopathy:    He has no cervical adenopathy.  Neurological: He is alert and oriented to person, place, and time.  Skin: Skin is warm and dry. No rash noted. No erythema.  Psychiatric: He has a normal mood and affect. His behavior is normal. Judgment and thought content normal.  Vitals reviewed.    Lab Results  Component Value Date   WBC 4.4 01/10/2017   HGB 10.5 (L) 01/10/2017   HCT 31.5 (L) 01/10/2017   MCV 95 01/10/2017   PLT 123 (L) 01/10/2017     Chemistry      Component Value Date/Time   NA 141 01/10/2017 1136   NA 137 12/14/2015 1100   K 4.4 01/10/2017 1136   K 3.7 12/14/2015 1100   CL 104 01/10/2017 1136   CO2 27 01/10/2017 1136   CO2 24 12/14/2015 1100   BUN 19 01/10/2017 1136   BUN 18.4 12/14/2015 1100   CREATININE 1.1 01/10/2017 1136   CREATININE 0.9 12/14/2015 1100      Component Value Date/Time   CALCIUM 9.4 01/10/2017 1136   CALCIUM 9.5 12/14/2015 1100   ALKPHOS 112 (H) 01/10/2017 1136   ALKPHOS 102 12/14/2015 1100   AST 23 01/10/2017 1136   AST 22 12/14/2015 1100   ALT 18 01/10/2017 1136   ALT 10 12/14/2015 1100   BILITOT 0.50  01/10/2017 1136   BILITOT 0.58 12/14/2015 1100         Impression and Plan: Gary Yates is an 84 year old white male. He has relapsed large cell non-Hodgkin lymphoma of the testicle. He was on salvage chemotherapy with Rituxan/bendamustine/Velcade. Again, he is not a candidate for aggressive intervention. He definitely is not a candidate for stem cell transplantation.  I spent about 40 minutes with he, his wife and son.  Again I have known them for many years.  I do believe that we might actually be able to treat him a little more aggressively.  He does have a fairly good quality of life.  I think that giving him dose reduced RICE would be reasonable.  I think these would be active agents for his lymphoma.  Again he does not have a lot of bulky disease.  I went over the treatment protocol.  Again, we will clearly give him dosage reduction.  I explained how chemotherapy worked.  I would definitely repeat a PET scan after his second cycle of treatment.  He does have some cardiac issues.  I do not think this will be a problem.  He does have diabetes.  This might be an issue however.  He is on Coumadin.  We will have to watch him weekly and check his blood counts out.  I think this is really necessary but so that we try to get him through this protocol as safely as possible.  I think the chance of responding should be about 80%.  I would think that if he responds, probably 4 cycles of treatment would be reasonable to give.    I answered all their questions.  I, again, gave him information sheets about each drugs that we will use.  We will try to get started next week.  Again we will have him come back weekly for lab work.   Volanda Napoleon, MD 1/4/20196:07 PM

## 2017-01-10 NOTE — Patient Instructions (Signed)
Implanted Port Home Guide An implanted port is a type of central line that is placed under the skin. Central lines are used to provide IV access when treatment or nutrition needs to be given through a person's veins. Implanted ports are used for long-term IV access. An implanted port may be placed because:  You need IV medicine that would be irritating to the small veins in your hands or arms.  You need long-term IV medicines, such as antibiotics.  You need IV nutrition for a long period.  You need frequent blood draws for lab tests.  You need dialysis.  Implanted ports are usually placed in the chest area, but they can also be placed in the upper arm, the abdomen, or the leg. An implanted port has two main parts:  Reservoir. The reservoir is round and will appear as a small, raised area under your skin. The reservoir is the part where a needle is inserted to give medicines or draw blood.  Catheter. The catheter is a thin, flexible tube that extends from the reservoir. The catheter is placed into a large vein. Medicine that is inserted into the reservoir goes into the catheter and then into the vein.  How will I care for my incision site? Do not get the incision site wet. Bathe or shower as directed by your health care provider. How is my port accessed? Special steps must be taken to access the port:  Before the port is accessed, a numbing cream can be placed on the skin. This helps numb the skin over the port site.  Your health care provider uses a sterile technique to access the port. ? Your health care provider must put on a mask and sterile gloves. ? The skin over your port is cleaned carefully with an antiseptic and allowed to dry. ? The port is gently pinched between sterile gloves, and a needle is inserted into the port.  Only "non-coring" port needles should be used to access the port. Once the port is accessed, a blood return should be checked. This helps ensure that the port  is in the vein and is not clogged.  If your port needs to remain accessed for a constant infusion, a clear (transparent) bandage will be placed over the needle site. The bandage and needle will need to be changed every week, or as directed by your health care provider.  Keep the bandage covering the needle clean and dry. Do not get it wet. Follow your health care provider's instructions on how to take a shower or bath while the port is accessed.  If your port does not need to stay accessed, no bandage is needed over the port.  What is flushing? Flushing helps keep the port from getting clogged. Follow your health care provider's instructions on how and when to flush the port. Ports are usually flushed with saline solution or a medicine called heparin. The need for flushing will depend on how the port is used.  If the port is used for intermittent medicines or blood draws, the port will need to be flushed: ? After medicines have been given. ? After blood has been drawn. ? As part of routine maintenance.  If a constant infusion is running, the port may not need to be flushed.  How long will my port stay implanted? The port can stay in for as long as your health care provider thinks it is needed. When it is time for the port to come out, surgery will be   done to remove it. The procedure is similar to the one performed when the port was put in. When should I seek immediate medical care? When you have an implanted port, you should seek immediate medical care if:  You notice a bad smell coming from the incision site.  You have swelling, redness, or drainage at the incision site.  You have more swelling or pain at the port site or the surrounding area.  You have a fever that is not controlled with medicine.  This information is not intended to replace advice given to you by your health care provider. Make sure you discuss any questions you have with your health care provider. Document  Released: 12/24/2004 Document Revised: 06/01/2015 Document Reviewed: 08/31/2012 [employer] Interactive Patient Education  2017 [employer]  

## 2017-01-11 LAB — VITAMIN D 25 HYDROXY (VIT D DEFICIENCY, FRACTURES): VIT D 25 HYDROXY: 26.2 ng/mL — AB (ref 30.0–100.0)

## 2017-01-14 ENCOUNTER — Other Ambulatory Visit: Payer: Self-pay | Admitting: *Deleted

## 2017-01-14 DIAGNOSIS — C8589 Other specified types of non-Hodgkin lymphoma, extranodal and solid organ sites: Secondary | ICD-10-CM

## 2017-01-15 ENCOUNTER — Inpatient Hospital Stay: Payer: Medicare Other | Attending: Hematology & Oncology

## 2017-01-15 ENCOUNTER — Inpatient Hospital Stay: Payer: Medicare Other

## 2017-01-15 ENCOUNTER — Other Ambulatory Visit: Payer: Self-pay | Admitting: Family

## 2017-01-15 DIAGNOSIS — H538 Other visual disturbances: Secondary | ICD-10-CM | POA: Diagnosis not present

## 2017-01-15 DIAGNOSIS — Z7901 Long term (current) use of anticoagulants: Secondary | ICD-10-CM | POA: Diagnosis not present

## 2017-01-15 DIAGNOSIS — M549 Dorsalgia, unspecified: Secondary | ICD-10-CM | POA: Insufficient documentation

## 2017-01-15 DIAGNOSIS — Z5111 Encounter for antineoplastic chemotherapy: Secondary | ICD-10-CM | POA: Diagnosis not present

## 2017-01-15 DIAGNOSIS — R202 Paresthesia of skin: Secondary | ICD-10-CM | POA: Diagnosis not present

## 2017-01-15 DIAGNOSIS — R531 Weakness: Secondary | ICD-10-CM | POA: Diagnosis not present

## 2017-01-15 DIAGNOSIS — Z7984 Long term (current) use of oral hypoglycemic drugs: Secondary | ICD-10-CM | POA: Insufficient documentation

## 2017-01-15 DIAGNOSIS — Z880 Allergy status to penicillin: Secondary | ICD-10-CM | POA: Insufficient documentation

## 2017-01-15 DIAGNOSIS — Z923 Personal history of irradiation: Secondary | ICD-10-CM | POA: Diagnosis not present

## 2017-01-15 DIAGNOSIS — C8589 Other specified types of non-Hodgkin lymphoma, extranodal and solid organ sites: Secondary | ICD-10-CM

## 2017-01-15 DIAGNOSIS — R600 Localized edema: Secondary | ICD-10-CM | POA: Diagnosis not present

## 2017-01-15 DIAGNOSIS — Z792 Long term (current) use of antibiotics: Secondary | ICD-10-CM | POA: Insufficient documentation

## 2017-01-15 DIAGNOSIS — R51 Headache: Secondary | ICD-10-CM | POA: Diagnosis not present

## 2017-01-15 DIAGNOSIS — Z7689 Persons encountering health services in other specified circumstances: Secondary | ICD-10-CM | POA: Insufficient documentation

## 2017-01-15 DIAGNOSIS — Z79899 Other long term (current) drug therapy: Secondary | ICD-10-CM | POA: Insufficient documentation

## 2017-01-15 DIAGNOSIS — G44209 Tension-type headache, unspecified, not intractable: Secondary | ICD-10-CM

## 2017-01-15 DIAGNOSIS — R200 Anesthesia of skin: Secondary | ICD-10-CM | POA: Diagnosis not present

## 2017-01-15 DIAGNOSIS — J029 Acute pharyngitis, unspecified: Secondary | ICD-10-CM | POA: Insufficient documentation

## 2017-01-15 DIAGNOSIS — D696 Thrombocytopenia, unspecified: Secondary | ICD-10-CM | POA: Insufficient documentation

## 2017-01-15 DIAGNOSIS — R5383 Other fatigue: Secondary | ICD-10-CM | POA: Insufficient documentation

## 2017-01-15 DIAGNOSIS — Z7902 Long term (current) use of antithrombotics/antiplatelets: Secondary | ICD-10-CM | POA: Insufficient documentation

## 2017-01-15 LAB — CMP (CANCER CENTER ONLY)
ALBUMIN: 3.2 g/dL — AB (ref 3.5–5.0)
ALT: 17 U/L (ref 0–55)
AST: 23 U/L (ref 5–34)
Alkaline Phosphatase: 108 U/L (ref 40–150)
Anion gap: 13 (ref 5–15)
BUN: 12 mg/dL (ref 7–26)
CHLORIDE: 104 mmol/L (ref 98–109)
CO2: 27 mmol/L (ref 22–29)
CREATININE: 1 mg/dL (ref 0.70–1.30)
Calcium: 8.9 mg/dL (ref 8.4–10.4)
GFR, Estimated: >60 mL/min (ref >60)
GLUCOSE: 122 mg/dL (ref 70–140)
Potassium: 4.3 mmol/L (ref 3.5–5.1)
SODIUM: 144 mmol/L (ref 136–145)
Total Bilirubin: 0.6 mg/dL (ref 0.2–1.2)
Total Protein: 6.4 g/dL (ref 6.4–8.3)

## 2017-01-15 LAB — CBC WITH DIFFERENTIAL (CANCER CENTER ONLY)
Abs Granulocyte: 2.3 10*3/uL (ref 1.5–6.5)
Basophils Absolute: 0 10*3/uL (ref 0.0–0.1)
Basophils Relative: 1 %
EOS PCT: 4 %
Eosinophils Absolute: 0.1 10*3/uL (ref 0.0–0.5)
HCT: 30.7 % — ABNORMAL LOW (ref 38.7–49.9)
Hemoglobin: 10.4 g/dL — ABNORMAL LOW (ref 13.0–17.1)
LYMPHS ABS: 0.8 10*3/uL — AB (ref 0.9–3.3)
LYMPHS PCT: 20 %
MCH: 32 pg (ref 28.0–33.4)
MCHC: 33.9 g/dL (ref 32.0–35.9)
MCV: 94.5 fL (ref 82.0–98.0)
MONOS PCT: 13 %
Monocytes Absolute: 0.5 10*3/uL (ref 0.1–0.9)
NEUTROS PCT: 62 %
Neutro Abs: 2.3 10*3/uL (ref 1.5–6.5)
PLATELETS: 128 10*3/uL — AB (ref 140–400)
RBC: 3.25 MIL/uL — ABNORMAL LOW (ref 4.20–5.70)
RDW: 14.1 % (ref 11.1–15.7)
WBC Count: 3.7 10*3/uL — ABNORMAL LOW (ref 4.0–10.3)

## 2017-01-15 MED ORDER — DIPHENHYDRAMINE HCL 25 MG PO CAPS
ORAL_CAPSULE | ORAL | Status: AC
  Filled 2017-01-15: qty 2

## 2017-01-15 MED ORDER — PALONOSETRON HCL INJECTION 0.25 MG/5ML
INTRAVENOUS | Status: AC
  Filled 2017-01-15: qty 5

## 2017-01-15 MED ORDER — HEPARIN SOD (PORK) LOCK FLUSH 100 UNIT/ML IV SOLN
500.0000 [IU] | Freq: Once | INTRAVENOUS | Status: AC | PRN
  Administered 2017-01-15: 500 [IU]
  Filled 2017-01-15: qty 5

## 2017-01-15 MED ORDER — DIPHENHYDRAMINE HCL 25 MG PO CAPS
50.0000 mg | ORAL_CAPSULE | Freq: Once | ORAL | Status: AC
  Administered 2017-01-15: 50 mg via ORAL

## 2017-01-15 MED ORDER — MESNA 100 MG/ML IV SOLN
400.0000 mg/m2 | Freq: Once | INTRAVENOUS | Status: AC
  Administered 2017-01-15: 900 mg via INTRAVENOUS
  Filled 2017-01-15: qty 9

## 2017-01-15 MED ORDER — DEXAMETHASONE SODIUM PHOSPHATE 10 MG/ML IJ SOLN
INTRAMUSCULAR | Status: AC
  Filled 2017-01-15: qty 1

## 2017-01-15 MED ORDER — SODIUM CHLORIDE 0.9 % IV SOLN
Freq: Once | INTRAVENOUS | Status: AC
  Administered 2017-01-15: 15:00:00 via INTRAVENOUS
  Filled 2017-01-15: qty 51

## 2017-01-15 MED ORDER — PALONOSETRON HCL INJECTION 0.25 MG/5ML
0.2500 mg | Freq: Once | INTRAVENOUS | Status: AC
  Administered 2017-01-15: 0.25 mg via INTRAVENOUS

## 2017-01-15 MED ORDER — PROCHLORPERAZINE MALEATE 10 MG PO TABS: 10.0000 mg | ORAL_TABLET | Freq: Four times a day (QID) | ORAL | 1 refills | Status: DC | PRN

## 2017-01-15 MED ORDER — SODIUM CHLORIDE 0.9 % IV SOLN
80.0000 mg/m2 | Freq: Once | INTRAVENOUS | Status: AC
  Administered 2017-01-15: 180 mg via INTRAVENOUS
  Filled 2017-01-15: qty 9

## 2017-01-15 MED ORDER — ACETAMINOPHEN 325 MG PO TABS
650.0000 mg | ORAL_TABLET | Freq: Once | ORAL | Status: AC
  Administered 2017-01-15: 650 mg via ORAL

## 2017-01-15 MED ORDER — ONDANSETRON HCL 8 MG PO TABS: 8.0000 mg | ORAL_TABLET | Freq: Two times a day (BID) | ORAL | 1 refills | Status: DC | PRN

## 2017-01-15 MED ORDER — DEXAMETHASONE 4 MG PO TABS: ORAL_TABLET | ORAL | 1 refills | Status: DC

## 2017-01-15 MED ORDER — ACETAMINOPHEN 325 MG PO TABS: 650.0000 mg | ORAL_TABLET | Freq: Once | ORAL | Status: DC

## 2017-01-15 MED ORDER — DEXAMETHASONE SODIUM PHOSPHATE 10 MG/ML IJ SOLN
10.0000 mg | Freq: Once | INTRAMUSCULAR | Status: AC
  Administered 2017-01-15: 10 mg via INTRAVENOUS

## 2017-01-15 MED ORDER — ACETAMINOPHEN 325 MG PO TABS
ORAL_TABLET | ORAL | Status: AC
  Filled 2017-01-15: qty 2

## 2017-01-15 MED ORDER — SODIUM CHLORIDE 0.9 % IV SOLN
375.0000 mg/m2 | Freq: Once | INTRAVENOUS | Status: AC
  Administered 2017-01-15: 900 mg via INTRAVENOUS
  Filled 2017-01-15: qty 50

## 2017-01-15 MED ORDER — SODIUM CHLORIDE 0.9% FLUSH
10.0000 mL | INTRAVENOUS | Status: DC | PRN
  Administered 2017-01-15: 10 mL
  Filled 2017-01-15: qty 10

## 2017-01-15 MED ORDER — SODIUM CHLORIDE 0.9 % IV SOLN
Freq: Once | INTRAVENOUS | Status: AC
  Administered 2017-01-15: 08:00:00 via INTRAVENOUS

## 2017-01-15 NOTE — Patient Instructions (Signed)
Implanted Port Insertion, Care After °This sheet gives you information about how to care for yourself after your procedure. Your health care provider may also give you more specific instructions. If you have problems or questions, contact your health care provider. °What can I expect after the procedure? °After your procedure, it is common to have: °· Discomfort at the port insertion site. °· Bruising on the skin over the port. This should improve over 3-4 days. ° °Follow these instructions at home: °Port care °· After your port is placed, you will get a manufacturer's information card. The card has information about your port. Keep this card with you at all times. °· Take care of the port as told by your health care provider. Ask your health care provider if you or a family member can get training for taking care of the port at home. A home health care nurse may also take care of the port. °· Make sure to remember what type of port you have. °Incision care °· Follow instructions from your health care provider about how to take care of your port insertion site. Make sure you: °? Wash your hands with soap and water before you change your bandage (dressing). If soap and water are not available, use hand sanitizer. °? Change your dressing as told by your health care provider. °? Leave stitches (sutures), skin glue, or adhesive strips in place. These skin closures may need to stay in place for 2 weeks or longer. If adhesive strip edges start to loosen and curl up, you may trim the loose edges. Do not remove adhesive strips completely unless your health care provider tells you to do that. °· Check your port insertion site every day for signs of infection. Check for: °? More redness, swelling, or pain. °? More fluid or blood. °? Warmth. °? Pus or a bad smell. °General instructions °· Do not take baths, swim, or use a hot tub until your health care provider approves. °· Do not lift anything that is heavier than 10 lb (4.5  kg) for a week, or as told by your health care provider. °· Ask your health care provider when it is okay to: °? Return to work or school. °? Resume usual physical activities or sports. °· Do not drive for 24 hours if you were given a medicine to help you relax (sedative). °· Take over-the-counter and prescription medicines only as told by your health care provider. °· Wear a medical alert bracelet in case of an emergency. This will tell any health care providers that you have a port. °· Keep all follow-up visits as told by your health care provider. This is important. °Contact a health care provider if: °· You cannot flush your port with saline as directed, or you cannot draw blood from the port. °· You have a fever or chills. °· You have more redness, swelling, or pain around your port insertion site. °· You have more fluid or blood coming from your port insertion site. °· Your port insertion site feels warm to the touch. °· You have pus or a bad smell coming from the port insertion site. °Get help right away if: °· You have chest pain or shortness of breath. °· You have bleeding from your port that you cannot control. °Summary °· Take care of the port as told by your health care provider. °· Change your dressing as told by your health care provider. °· Keep all follow-up visits as told by your health care provider. °  This information is not intended to replace advice given to you by your health care provider. Make sure you discuss any questions you have with your health care provider. °Document Released: 10/14/2012 Document Revised: 11/15/2015 Document Reviewed: 11/15/2015 °Elsevier Interactive Patient Education © 2017 [employer] ° °

## 2017-01-15 NOTE — Progress Notes (Signed)
Ok to proceed with chemo without labwork today.

## 2017-01-15 NOTE — Patient Instructions (Addendum)
[facility] Discharge Instructions for Patients Receiving Chemotherapy  Today you received the following chemotherapy agents Rituxan, Etoposide, Mesna, Ifosphamide  To help prevent nausea and vomiting after your treatment, we encourage you to take your nausea medication   Beginning Saturday, 01/18/17 take Dexamethasone (Decadron) twice daily in the morning with food.  Beginning Monday morning, take Zofran 8 mg by mouth twice daily (once in the morning, once in the evening)  Tonight if you are nauseated take Compazine 10 mg by mouth as needed every 6 hours.     If you develop nausea and vomiting that is not controlled by your nausea medication, call the clinic.   BELOW ARE SYMPTOMS THAT SHOULD BE REPORTED IMMEDIATELY:  *FEVER GREATER THAN 100.5 F  *CHILLS WITH OR WITHOUT FEVER  NAUSEA AND VOMITING THAT IS NOT CONTROLLED WITH YOUR NAUSEA MEDICATION  *UNUSUAL SHORTNESS OF BREATH  *UNUSUAL BRUISING OR BLEEDING  TENDERNESS IN MOUTH AND THROAT WITH OR WITHOUT PRESENCE OF ULCERS  *URINARY PROBLEMS  *BOWEL PROBLEMS  UNUSUAL RASH Items with * indicate a potential emergency and should be followed up as soon as possible.  Feel free to call the clinic should you have any questions or concerns. The clinic phone number is [phone].  Please show the [facility] at check-in to the Emergency Department and triage nurse.  6

## 2017-01-16 ENCOUNTER — Inpatient Hospital Stay: Payer: Medicare Other

## 2017-01-16 VITALS — BP 125/64 | HR 84 | Temp 97.7°F | Resp 20

## 2017-01-16 DIAGNOSIS — C8589 Other specified types of non-Hodgkin lymphoma, extranodal and solid organ sites: Secondary | ICD-10-CM

## 2017-01-16 MED ORDER — SODIUM CHLORIDE 0.9 % IV SOLN
Freq: Once | INTRAVENOUS | Status: AC
  Administered 2017-01-16: 09:00:00 via INTRAVENOUS

## 2017-01-16 MED ORDER — SODIUM CHLORIDE 0.9 % IV SOLN
Freq: Once | INTRAVENOUS | Status: AC
  Administered 2017-01-16: 13:00:00 via INTRAVENOUS
  Filled 2017-01-16: qty 51

## 2017-01-16 MED ORDER — HEPARIN SOD (PORK) LOCK FLUSH 100 UNIT/ML IV SOLN
500.0000 [IU] | Freq: Once | INTRAVENOUS | Status: AC
  Administered 2017-01-16: 500 [IU] via INTRAVENOUS
  Filled 2017-01-16: qty 5

## 2017-01-16 MED ORDER — SODIUM CHLORIDE 0.9 % IV SOLN
414.4000 mg | Freq: Once | INTRAVENOUS | Status: AC
  Administered 2017-01-16: 410 mg via INTRAVENOUS
  Filled 2017-01-16: qty 41

## 2017-01-16 MED ORDER — SODIUM CHLORIDE 0.9% FLUSH
10.0000 mL | INTRAVENOUS | Status: DC | PRN
  Administered 2017-01-16: 10 mL
  Filled 2017-01-16: qty 10

## 2017-01-16 MED ORDER — PEGFILGRASTIM 6 MG/0.6ML ~~LOC~~ PSKT
PREFILLED_SYRINGE | SUBCUTANEOUS | Status: AC
  Filled 2017-01-16: qty 0.6

## 2017-01-16 MED ORDER — PEGFILGRASTIM 6 MG/0.6ML ~~LOC~~ PSKT
6.0000 mg | PREFILLED_SYRINGE | Freq: Once | SUBCUTANEOUS | Status: AC
  Administered 2017-01-16: 6 mg via SUBCUTANEOUS

## 2017-01-16 MED ORDER — DEXAMETHASONE SODIUM PHOSPHATE 10 MG/ML IJ SOLN
10.0000 mg | Freq: Once | INTRAMUSCULAR | Status: AC
  Administered 2017-01-16: 10 mg via INTRAVENOUS

## 2017-01-16 MED ORDER — SODIUM CHLORIDE 0.9 % IV SOLN
400.0000 mg/m2 | Freq: Once | INTRAVENOUS | Status: AC
  Administered 2017-01-16: 900 mg via INTRAVENOUS
  Filled 2017-01-16: qty 9

## 2017-01-16 MED ORDER — SODIUM CHLORIDE 0.9% FLUSH
10.0000 mL | INTRAVENOUS | Status: DC | PRN
  Filled 2017-01-16: qty 10

## 2017-01-16 MED ORDER — SODIUM CHLORIDE 0.9 % IV SOLN
80.0000 mg/m2 | Freq: Once | INTRAVENOUS | Status: AC
  Administered 2017-01-16: 180 mg via INTRAVENOUS
  Filled 2017-01-16: qty 9

## 2017-01-16 MED ORDER — DEXAMETHASONE SODIUM PHOSPHATE 10 MG/ML IJ SOLN
INTRAMUSCULAR | Status: AC
  Filled 2017-01-16: qty 1

## 2017-01-16 NOTE — Patient Instructions (Signed)
[facility] Discharge Instructions for Patients Receiving Chemotherapy  Today you received the following chemotherapy agents:  Etoposide, Mesna, Carboplatin and Ifosfamide  To help prevent nausea and vomiting after your treatment, we encourage you to take your nausea medication as ordered per MD.    If you develop nausea and vomiting that is not controlled by your nausea medication, call the clinic.   BELOW ARE SYMPTOMS THAT SHOULD BE REPORTED IMMEDIATELY:  *FEVER GREATER THAN 100.5 F  *CHILLS WITH OR WITHOUT FEVER  NAUSEA AND VOMITING THAT IS NOT CONTROLLED WITH YOUR NAUSEA MEDICATION  *UNUSUAL SHORTNESS OF BREATH  *UNUSUAL BRUISING OR BLEEDING  TENDERNESS IN MOUTH AND THROAT WITH OR WITHOUT PRESENCE OF ULCERS  *URINARY PROBLEMS  *BOWEL PROBLEMS  UNUSUAL RASH Items with * indicate a potential emergency and should be followed up as soon as possible.  Feel free to call the clinic should you have any questions or concerns. The clinic phone number is [phone].  Please show the [facility] at check-in to the Emergency Department and triage nurse.

## 2017-01-17 ENCOUNTER — Inpatient Hospital Stay: Payer: Medicare Other

## 2017-01-23 ENCOUNTER — Other Ambulatory Visit: Payer: Self-pay | Admitting: *Deleted

## 2017-01-23 ENCOUNTER — Telehealth: Payer: Self-pay | Admitting: *Deleted

## 2017-01-23 MED ORDER — POLYETHYLENE GLYCOL 3350 17 G PO PACK: 17.0000 g | PACK | Freq: Two times a day (BID) | ORAL | 3 refills | Status: DC

## 2017-01-23 MED ORDER — MAGIC MOUTHWASH W/LIDOCAINE: 5.0000 mL | Freq: Four times a day (QID) | ORAL | 3 refills | Status: DC | PRN

## 2017-01-23 MED ORDER — POLYETHYLENE GLYCOL 3350 17 GM/SCOOP PO POWD: 17.0000 g | Freq: Two times a day (BID) | ORAL | 0 refills | Status: DC | PRN

## 2017-01-23 NOTE — Telephone Encounter (Signed)
Patient is c/o multiple sores in his mouth, mostly on left side. He also has not had a bowel movement in several days.   Reviewed symptoms with Dr Marin Olp. We will send in a prescription for Magic Mouthwash. Patient instructed to take Miralax OTC for constipation.   He is already scheduled tomorrow to see Dr Marin Olp. We will follow up with symptoms tomorrow.   Patient is aware of new prescriptions and pharmacy is confirmed.

## 2017-01-24 ENCOUNTER — Other Ambulatory Visit: Payer: Self-pay

## 2017-01-24 ENCOUNTER — Inpatient Hospital Stay (HOSPITAL_BASED_OUTPATIENT_CLINIC_OR_DEPARTMENT_OTHER): Payer: Medicare Other | Admitting: Hematology & Oncology

## 2017-01-24 ENCOUNTER — Inpatient Hospital Stay: Payer: Medicare Other

## 2017-01-24 ENCOUNTER — Telehealth: Payer: Self-pay | Admitting: *Deleted

## 2017-01-24 VITALS — BP 131/68 | HR 87 | Temp 98.0°F | Resp 19 | Wt 213.1 lb

## 2017-01-24 DIAGNOSIS — Z800 Family history of malignant neoplasm of digestive organs: Secondary | ICD-10-CM

## 2017-01-24 DIAGNOSIS — Z7984 Long term (current) use of oral hypoglycemic drugs: Secondary | ICD-10-CM

## 2017-01-24 DIAGNOSIS — C8589 Other specified types of non-Hodgkin lymphoma, extranodal and solid organ sites: Secondary | ICD-10-CM

## 2017-01-24 DIAGNOSIS — Z7901 Long term (current) use of anticoagulants: Secondary | ICD-10-CM | POA: Diagnosis not present

## 2017-01-24 DIAGNOSIS — Z79899 Other long term (current) drug therapy: Secondary | ICD-10-CM

## 2017-01-24 DIAGNOSIS — R51 Headache: Secondary | ICD-10-CM | POA: Diagnosis not present

## 2017-01-24 DIAGNOSIS — Z7902 Long term (current) use of antithrombotics/antiplatelets: Secondary | ICD-10-CM

## 2017-01-24 DIAGNOSIS — D696 Thrombocytopenia, unspecified: Secondary | ICD-10-CM | POA: Diagnosis not present

## 2017-01-24 DIAGNOSIS — Z923 Personal history of irradiation: Secondary | ICD-10-CM

## 2017-01-24 DIAGNOSIS — H538 Other visual disturbances: Secondary | ICD-10-CM

## 2017-01-24 DIAGNOSIS — M549 Dorsalgia, unspecified: Secondary | ICD-10-CM

## 2017-01-24 DIAGNOSIS — Z792 Long term (current) use of antibiotics: Secondary | ICD-10-CM | POA: Diagnosis not present

## 2017-01-24 DIAGNOSIS — R600 Localized edema: Secondary | ICD-10-CM | POA: Diagnosis not present

## 2017-01-24 DIAGNOSIS — Z95828 Presence of other vascular implants and grafts: Secondary | ICD-10-CM

## 2017-01-24 LAB — CBC WITH DIFFERENTIAL (CANCER CENTER ONLY)
Basophils Absolute: 0 10*3/uL (ref 0.0–0.1)
Basophils Relative: 1 %
EOS ABS: 0 10*3/uL (ref 0.0–0.5)
EOS PCT: 1 %
HCT: 27.4 % — ABNORMAL LOW (ref 38.7–49.9)
Hemoglobin: 9.1 g/dL — ABNORMAL LOW (ref 13.0–17.1)
LYMPHS ABS: 0.3 10*3/uL — AB (ref 0.9–3.3)
LYMPHS PCT: 38 %
MCH: 31.4 pg (ref 28.0–33.4)
MCHC: 33.2 g/dL (ref 32.0–35.9)
MCV: 94.5 fL (ref 82.0–98.0)
MONO ABS: 0.2 10*3/uL (ref 0.1–0.9)
Monocytes Relative: 24 %
Neutro Abs: 0.3 10*3/uL — CL (ref 1.5–6.5)
Neutrophils Relative %: 36 %
PLATELETS: 43 10*3/uL — AB (ref 140–400)
RBC: 2.9 MIL/uL — ABNORMAL LOW (ref 4.20–5.70)
RDW: 13.1 % (ref 11.1–15.7)
WBC Count: 0.8 10*3/uL — CL (ref 4.0–10.3)

## 2017-01-24 LAB — CMP (CANCER CENTER ONLY)
ALBUMIN: 3.3 g/dL — AB (ref 3.5–5.0)
ALK PHOS: 94 U/L — AB (ref 26–84)
ALT: 22 U/L (ref 0–55)
AST: 16 U/L (ref 5–34)
Anion gap: 12 (ref 5–15)
BILIRUBIN TOTAL: 0.8 mg/dL (ref 0.2–1.2)
BUN: 15 mg/dL (ref 7–22)
CALCIUM: 8.7 mg/dL (ref 8.0–10.3)
CO2: 26 mmol/L (ref 18–33)
CREATININE: 0.6 mg/dL — AB (ref 0.70–1.30)
Chloride: 101 mmol/L (ref 98–108)
Glucose, Bld: 130 mg/dL — ABNORMAL HIGH (ref 70–118)
Potassium: 3.9 mmol/L (ref 3.3–4.7)
SODIUM: 139 mmol/L (ref 128–145)
Total Protein: 6.2 g/dL — ABNORMAL LOW (ref 6.4–8.1)

## 2017-01-24 LAB — SAMPLE TO BLOOD BANK

## 2017-01-24 LAB — LACTATE DEHYDROGENASE: LDH: 141 U/L (ref 125–245)

## 2017-01-24 MED ORDER — HEPARIN SOD (PORK) LOCK FLUSH 100 UNIT/ML IV SOLN
500.0000 [IU] | Freq: Once | INTRAVENOUS | Status: AC
  Administered 2017-01-24: 500 [IU] via INTRAVENOUS
  Filled 2017-01-24: qty 5

## 2017-01-24 MED ORDER — SODIUM CHLORIDE 0.9% FLUSH
10.0000 mL | INTRAVENOUS | Status: DC | PRN
  Administered 2017-01-24: 10 mL via INTRAVENOUS
  Filled 2017-01-24: qty 10

## 2017-01-24 MED ORDER — CIPROFLOXACIN HCL 500 MG PO TABS: 500.0000 mg | ORAL_TABLET | Freq: Two times a day (BID) | ORAL | 1 refills | Status: DC

## 2017-01-24 NOTE — Telephone Encounter (Signed)
Critical Value WBC 0.8 ANC 0.3 Dr Marin Olp notified. No orders at this time

## 2017-01-24 NOTE — Addendum Note (Signed)
Addended by: [place] on: 01/24/2017 11:26 AM   Modules accepted: Orders, SmartSet

## 2017-01-27 ENCOUNTER — Other Ambulatory Visit: Payer: Medicare Other

## 2017-01-27 ENCOUNTER — Other Ambulatory Visit: Payer: Self-pay | Admitting: General Practice

## 2017-01-27 ENCOUNTER — Other Ambulatory Visit: Payer: Self-pay | Admitting: Family Medicine

## 2017-01-27 ENCOUNTER — Ambulatory Visit: Payer: Medicare Other | Admitting: Hematology & Oncology

## 2017-01-27 MED ORDER — WARFARIN SODIUM 5 MG PO TABS: ORAL_TABLET | ORAL | 1 refills | Status: DC

## 2017-01-27 NOTE — Progress Notes (Signed)
Hematology and Oncology Follow Up Visit  Gary Yates [id number] April 17, 1933 84 y.o. 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  He has for cycle of chemotherapy about a week ago.  I actually reduce his number of days from 3 down to 2.  I am glad I did this.  His white cell count was down to 0.8.  He was having some mouth sores.  We gave him some mouthwash.  He has had no diarrhea.  He has had no vomiting.  He has had a decent appetite.  He has had chronic leg swelling which is no different.  He has had no headache.  He has not lost his hair yet.  Overall, his performance status is ECOG 1.   Medications:  Current Outpatient Medications:  .  ACCU-CHEK AVIVA PLUS test strip, TEST 3 TIMES A DAY (Patient taking differently: TEST 2 TIMES A DAY), Disp: 300 each, Rfl: 5 .  acetaminophen (TYLENOL) 500 MG tablet, Take 1,000 mg by mouth every 6 (six) hours as needed (for pain/fever/headaches.). , Disp: , Rfl:  .  atorvastatin (LIPITOR) 40 MG tablet, TAKE 1 TABLET BY MOUTH DAILY, Disp: 90 tablet, Rfl: 2 .  ciprofloxacin (CIPRO) 500 MG tablet, Take 1 tablet (500 mg total) by mouth 2 (two) times daily., Disp: 14 tablet, Rfl: 1 .  clopidogrel (PLAVIX) 75 MG tablet, TAKE 1 TABLET BY MOUTH DAILY, Disp: 30 tablet, Rfl: 5 .  dexamethasone (DECADRON) 4 MG tablet, Take 2 tablets one time a day for 2 days starting the day after chemotherapy. Take with food., Disp: 30 tablet, Rfl: 1 .  docusate sodium (COLACE) 50 MG capsule, Take 1 capsule (50 mg total) by mouth 2 (two) times daily as needed (for constipation)., Disp: 90 capsule, Rfl: 0 .  furosemide  (LASIX) 40 MG tablet, Take 1 tablet (40 mg total) by mouth 2 (two) times daily., Disp: 60 tablet, Rfl: 0 .  gabapentin (NEURONTIN) 300 MG capsule, Take 2 capsules (600 mg total) by mouth 3 (three) times daily., Disp: 540 capsule, Rfl: 2 .  KLOR-CON M20 20 MEQ tablet, TAKE 1 TABLET EVERY DAY, Disp: 90 tablet, Rfl: 0 .  lidocaine (XYLOCAINE) 2 % solution, , Disp: , Rfl:  .  magic mouthwash w/lidocaine SOLN, Take 5 mLs by mouth 4 (four) times daily as needed for mouth pain., Disp: 600 mL, Rfl: 3 .  metFORMIN (GLUCOPHAGE) 500 MG tablet, TAKE 1 TABLET BY MOUTH TWICE A DAY WITH A MEAL, Disp: 180 tablet, Rfl: 2 .  metoprolol succinate (TOPROL-XL) 100 MG 24 hr tablet, TAKE 1 TABLET BY MOUTH EVERY DAY IMMEDIATELY FOLLOWING A MEAL, Disp: 90 tablet, Rfl: 1 .  Multiple Vitamins-Minerals (CENTRUM SILVER 50+MEN) TABS, Take 1 tablet by mouth daily., Disp: , Rfl:  .  Niacin (VITAMIN B-3 PO), Take by mouth daily., Disp: , Rfl:  .  nitroGLYCERIN (NITROSTAT) 0.4 MG SL tablet, Place 1 tablet (0.4 mg total) under the tongue every 5 (five) minutes as needed. Chest pain, Disp: 25 tablet, Rfl: 6 .  nystatin (MYCOSTATIN) 100000 UNIT/ML suspension, , Disp: , Rfl:  .  ondansetron (ZOFRAN) 4  MG tablet, Take 1 tablet (4 mg total) by mouth every 8 (eight) hours as needed for nausea or vomiting., Disp: 30 tablet, Rfl: 0 .  ondansetron (ZOFRAN) 8 MG tablet, Take 1 tablet (8 mg total) by mouth 2 (two) times daily as needed. Start on day 6 of chemotherapy., Disp: 30 tablet, Rfl: 1 .  oxybutynin (DITROPAN XL) 15 MG 24 hr tablet, Take 15 mg by mouth at bedtime. , Disp: , Rfl:  .  oxymetazoline (AFRIN) 0.05 % nasal spray, Place 1-2 sprays into both nostrils 2 (two) times daily as needed for congestion., Disp: , Rfl:  .  pantoprazole (PROTONIX) 40 MG tablet, TAKE 1 TABLET BY MOUTH EVERY DAY, Disp: 90 tablet, Rfl: 3 .  polyethylene glycol powder (MIRALAX) powder, Take 17 g by mouth 2 (two) times daily as needed., Disp: 255 g, Rfl: 0 .   pramipexole (MIRAPEX) 1.5 MG tablet, Take two tablets every night., Disp: 180 tablet, Rfl: 3 .  prochlorperazine (COMPAZINE) 10 MG tablet, Take 1 tablet (10 mg total) by mouth every 6 (six) hours as needed (Nausea or vomiting)., Disp: 30 tablet, Rfl: 1 .  tamsulosin (FLOMAX) 0.4 MG CAPS capsule, Take 0.4 mg by mouth at bedtime., Disp: , Rfl:  .  temazepam (RESTORIL) 30 MG capsule, TAKE ONE CAPSULE AT BEDTIME AS NEEDED SLEEP, Disp: 15 capsule, Rfl: 0 .  TOUJEO SOLOSTAR 300 UNIT/ML SOPN, INJECT 20 UNITS INTO THE SKIN DAILY AFTER BREAKFAST., Disp: 15 mL, Rfl: 1 .  Trospium Chloride 60 MG CP24, Take 60 mg by mouth daily., Disp: , Rfl:  .  vitamin B-12 (CYANOCOBALAMIN) 1000 MCG tablet, Take 1,000 mcg by mouth daily., Disp: , Rfl:  .  warfarin (COUMADIN) 5 MG tablet, Take as directed by anticoagulation clinic, Disp: 50 tablet, Rfl: 1 No current facility-administered medications for this visit.   Facility-Administered Medications Ordered in Other Visits:  .  acetaminophen (TYLENOL) tablet 650 mg, 650 mg, Oral, Once, [place], Sarah M, NP .  testosterone cypionate (DEPOTESTOTERONE CYPIONATE) injection 200 mg, 200 mg, Intramuscular, Q28 days, [name], MD, 200 mg at 12/26/11 0850 .  testosterone cypionate (DEPOTESTOTERONE CYPIONATE) injection 200 mg, 200 mg, Intramuscular, Q28 days, [name], Alinda Sierras, MD, 200 mg at 01/23/12 0908 .  testosterone cypionate (DEPOTESTOTERONE CYPIONATE) injection 200 mg, 200 mg, Intramuscular, Q28 days, [name], Alinda Sierras, MD, 200 mg at 02/25/12 0848 .  testosterone cypionate (DEPOTESTOTERONE CYPIONATE) injection 200 mg, 200 mg, Intramuscular, Q28 days, [name], MD, 200 mg at 06/03/12 1437  Allergies:  Allergies  Allergen Reactions  . Ace Inhibitors Other (See Comments)    cough  . Codeine Nausea Only and Rash       . Penicillins Rash    Childhood allergy Has patient had a PCN reaction causing immediate rash, facial/tongue/throat swelling, SOB or  lightheadedness with hypotension: Yes Has patient had a PCN reaction causing severe rash involving mucus membranes or skin necrosis: Yes Has patient had a PCN reaction that required hospitalization No Has patient had a PCN reaction occurring within the last 10 years: No If all of the above answers are "NO", then may proceed with Cephalosporin use.     Past Medical History, Surgical history, Social history, and Family History were reviewed and updated.  Review of Systems: Review of Systems  HENT: Positive for sinus pain and sore throat.   Eyes: Positive for blurred vision.  Respiratory: Positive for wheezing.   Cardiovascular: Positive for leg swelling.  Gastrointestinal: Negative.   Genitourinary: Negative.  Musculoskeletal: Positive for back pain.  Skin: Negative.   Neurological: Positive for weakness.  Endo/Heme/Allergies: Negative.   Psychiatric/Behavioral: Negative.     Physical Exam:  vitals were not taken for this visit.   Wt Readings from Last 3 Encounters:  01/24/17 213 lb 1.9 oz (96.7 kg)  01/10/17 232 lb 8 oz (105.5 kg)  12/10/16 224 lb 8 oz (101.8 kg)      Physical Exam  Constitutional: He is oriented to person, place, and time.  HENT:  Head: Normocephalic and atraumatic.  Mouth/Throat: Oropharynx is clear and moist.  Eyes: EOM are normal. Pupils are equal, round, and reactive to light.  Neck: Normal range of motion.  Cardiovascular: Normal rate, regular rhythm and normal heart sounds.  Pulmonary/Chest: Effort normal and breath sounds normal.  Abdominal: Soft. Bowel sounds are normal.  Musculoskeletal: Normal range of motion. He exhibits no edema, tenderness or deformity.  Lymphadenopathy:    He has no cervical adenopathy.  Neurological: He is alert and oriented to person, place, and time.  Skin: Skin is warm and dry. No rash noted. No erythema.  Psychiatric: He has a normal mood and affect. His behavior is normal. Judgment and thought content normal.    Vitals reviewed.    Lab Results  Component Value Date   WBC 0.8 (LL) 01/24/2017   HGB 10.5 (L) 01/10/2017   HCT 27.4 (L) 01/24/2017   MCV 94.5 01/24/2017   PLT 43 (L) 01/24/2017     Chemistry      Component Value Date/Time   NA 139 01/24/2017 1005   NA 141 01/10/2017 1136   NA 137 12/14/2015 1100   K 3.9 01/24/2017 1005   K 4.4 01/10/2017 1136   K 3.7 12/14/2015 1100   CL 101 01/24/2017 1005   CL 104 01/10/2017 1136   CO2 26 01/24/2017 1005   CO2 27 01/10/2017 1136   CO2 24 12/14/2015 1100   BUN 15 01/24/2017 1005   BUN 19 01/10/2017 1136   BUN 18.4 12/14/2015 1100   CREATININE 1.1 01/10/2017 1136   CREATININE 0.9 12/14/2015 1100      Component Value Date/Time   CALCIUM 8.7 01/24/2017 1005   CALCIUM 9.4 01/10/2017 1136   CALCIUM 9.5 12/14/2015 1100   ALKPHOS 94 (H) 01/24/2017 1005   ALKPHOS 112 (H) 01/10/2017 1136   ALKPHOS 102 12/14/2015 1100   AST 16 01/24/2017 1005   AST 22 12/14/2015 1100   ALT 22 01/24/2017 1005   ALT 18 01/10/2017 1136   ALT 10 12/14/2015 1100   BILITOT 0.8 01/24/2017 1005   BILITOT 0.58 12/14/2015 1100         Impression and Plan: Mr. Kyllonen is an 84 year old white male. He has relapsed large cell non-Hodgkin lymphoma of the testicle. He was on salvage chemotherapy with Rituxan/bendamustine/Velcade. Again, he is not a candidate for aggressive intervention. He definitely is not a candidate for stem cell transplantation.  He is on both Plavix and Coumadin.  Given his thrombocytopenia, we I told him to stop his Coumadin.  His son was with him.  He understands.  I did go ahead and put him on some antibiotic.  I put him on some Cipro as a prophylactic measure.  We will have to watch him closely.  We will need to have him come back in another week so we can follow-up with his blood work.  It is not surprising if we will have to transfuse him.  I spent about 30 minutes with he  and his wife and son.  I have known them for a long time.      Volanda Napoleon, MD 1/21/20196:18 PM

## 2017-01-30 ENCOUNTER — Inpatient Hospital Stay: Payer: Medicare Other

## 2017-01-30 ENCOUNTER — Inpatient Hospital Stay (HOSPITAL_BASED_OUTPATIENT_CLINIC_OR_DEPARTMENT_OTHER): Payer: Medicare Other | Admitting: Family

## 2017-01-30 ENCOUNTER — Other Ambulatory Visit: Payer: Self-pay | Admitting: Family Medicine

## 2017-01-30 ENCOUNTER — Other Ambulatory Visit: Payer: Self-pay

## 2017-01-30 VITALS — BP 108/56 | HR 72 | Temp 98.2°F | Resp 20

## 2017-01-30 VITALS — BP 108/56 | HR 72 | Temp 98.2°F | Resp 20 | Wt 234.0 lb

## 2017-01-30 DIAGNOSIS — Z880 Allergy status to penicillin: Secondary | ICD-10-CM

## 2017-01-30 DIAGNOSIS — C8589 Other specified types of non-Hodgkin lymphoma, extranodal and solid organ sites: Secondary | ICD-10-CM

## 2017-01-30 DIAGNOSIS — Z7984 Long term (current) use of oral hypoglycemic drugs: Secondary | ICD-10-CM

## 2017-01-30 DIAGNOSIS — R200 Anesthesia of skin: Secondary | ICD-10-CM | POA: Diagnosis not present

## 2017-01-30 DIAGNOSIS — R5383 Other fatigue: Secondary | ICD-10-CM

## 2017-01-30 DIAGNOSIS — Z923 Personal history of irradiation: Secondary | ICD-10-CM | POA: Diagnosis not present

## 2017-01-30 DIAGNOSIS — Z7901 Long term (current) use of anticoagulants: Secondary | ICD-10-CM

## 2017-01-30 DIAGNOSIS — Z7902 Long term (current) use of antithrombotics/antiplatelets: Secondary | ICD-10-CM

## 2017-01-30 DIAGNOSIS — R202 Paresthesia of skin: Secondary | ICD-10-CM | POA: Diagnosis not present

## 2017-01-30 DIAGNOSIS — Z79899 Other long term (current) drug therapy: Secondary | ICD-10-CM

## 2017-01-30 DIAGNOSIS — Z95828 Presence of other vascular implants and grafts: Secondary | ICD-10-CM

## 2017-01-30 LAB — CMP (CANCER CENTER ONLY)
ALT: 12 U/L (ref 0–55)
ANION GAP: 7 (ref 5–15)
AST: 22 U/L (ref 5–34)
Albumin: 3.2 g/dL — ABNORMAL LOW (ref 3.5–5.0)
Alkaline Phosphatase: 125 U/L — ABNORMAL HIGH (ref 26–84)
BUN: 20 mg/dL (ref 7–22)
CO2: 27 mmol/L (ref 18–33)
Calcium: 9.5 mg/dL (ref 8.0–10.3)
Chloride: 109 mmol/L — ABNORMAL HIGH (ref 98–108)
Creatinine: 1.2 mg/dL (ref 0.70–1.30)
GLUCOSE: 180 mg/dL — AB (ref 70–118)
Potassium: 3.9 mmol/L (ref 3.3–4.7)
Sodium: 143 mmol/L (ref 128–145)
Total Bilirubin: 0.4 mg/dL (ref 0.2–1.2)
Total Protein: 6.4 g/dL (ref 6.4–8.1)

## 2017-01-30 LAB — CBC WITH DIFFERENTIAL (CANCER CENTER ONLY)
Basophils Absolute: 0 10*3/uL (ref 0.0–0.1)
Basophils Relative: 0 %
EOS ABS: 0 10*3/uL (ref 0.0–0.5)
EOS PCT: 0 %
HCT: 27.1 % — ABNORMAL LOW (ref 38.7–49.9)
Hemoglobin: 8.9 g/dL — ABNORMAL LOW (ref 13.0–17.1)
LYMPHS ABS: 0.5 10*3/uL — AB (ref 0.9–3.3)
Lymphocytes Relative: 15 %
MCH: 31.9 pg (ref 28.0–33.4)
MCHC: 32.8 g/dL (ref 32.0–35.9)
MCV: 97.1 fL (ref 82.0–98.0)
Monocytes Absolute: 0.3 10*3/uL (ref 0.1–0.9)
Monocytes Relative: 10 %
Neutro Abs: 2.5 10*3/uL (ref 1.5–6.5)
Neutrophils Relative %: 75 %
PLATELETS: 49 10*3/uL — AB (ref 140–400)
RBC: 2.79 MIL/uL — AB (ref 4.20–5.70)
RDW: 13.7 % (ref 11.1–15.7)
WBC: 3.3 10*3/uL — AB (ref 4.0–10.3)

## 2017-01-30 LAB — TECHNOLOGIST SMEAR REVIEW

## 2017-01-30 LAB — PROTIME-INR
INR: 0.95
PROTHROMBIN TIME: 12.6 s (ref 11.4–15.2)

## 2017-01-30 LAB — SAMPLE TO BLOOD BANK

## 2017-01-30 LAB — SAVE SMEAR

## 2017-01-30 MED ORDER — SODIUM CHLORIDE 0.9% FLUSH
10.0000 mL | INTRAVENOUS | Status: DC | PRN
  Administered 2017-01-30: 10 mL via INTRAVENOUS
  Filled 2017-01-30: qty 10

## 2017-01-30 MED ORDER — HEPARIN SOD (PORK) LOCK FLUSH 100 UNIT/ML IV SOLN
500.0000 [IU] | Freq: Once | INTRAVENOUS | Status: AC
  Administered 2017-01-30: 500 [IU] via INTRAVENOUS
  Filled 2017-01-30: qty 5

## 2017-01-30 NOTE — Progress Notes (Signed)
Hematology and Oncology Follow Up Visit  Gary Yates [id number] 24-Jun-1933 84 y.o. 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 His counts today are better. WBC count is 3.3, Hgb 8.9 and platelet count 49. He completed his Cipro today.  He is symptomatic with fatigue and napping through the day if needed. Both he and his family have had a stressful weekend. His son-in-law passed away unexpectedly.  The sores in his mouth have resolved with the magic mouth wash.  No fever, chills, n/v, cough, rash, dizziness, chest pain, palpitations, abdominal pain or changes in bowel or bladder habits.  His constipation has resolved with Miralax.  The swelling in his lower extremities comes and goes. He takes his lasix as prescribed which helps reduce. The numbness and tingling in his feet is unchanged. No c/o pain at this time.  He has maintained a good appetite and is staying well hydrated. His weight is stable.   ECOG Performance Status: 1 - Symptomatic but completely ambulatory  Medications:  Allergies as of 01/30/2017      Reactions   Ace Inhibitors Other (See Comments)   cough   Codeine Nausea Only, Rash      Penicillins Rash   Childhood allergy Has patient had a PCN reaction causing immediate rash, facial/tongue/throat swelling, SOB or lightheadedness with hypotension: Yes Has patient had a PCN reaction causing severe rash involving mucus membranes or skin necrosis: Yes Has patient had a PCN reaction that required hospitalization No Has patient had a PCN reaction occurring within the last 10 years: No If all of the above answers  are "NO", then may proceed with Cephalosporin use.      Medication List        Accurate as of 01/30/17 12:02 PM. Always use your most recent med list.          ACCU-CHEK AVIVA PLUS test strip Generic drug:  glucose blood TEST 3 TIMES A DAY   acetaminophen 500 MG tablet Commonly known as:  TYLENOL Take 1,000 mg by mouth every 6 (six) hours as needed (for pain/fever/headaches.).   atorvastatin 40 MG tablet Commonly known as:  LIPITOR TAKE 1 TABLET BY MOUTH DAILY   CENTRUM SILVER 50+MEN Tabs Take 1 tablet by mouth daily.   ciprofloxacin 500 MG tablet Commonly known as:  CIPRO Take 1 tablet (500 mg total) by mouth 2 (two) times daily.   clopidogrel 75 MG tablet Commonly known as:  PLAVIX TAKE 1 TABLET BY MOUTH DAILY   dexamethasone 4 MG tablet Commonly known as:  DECADRON Take 2 tablets one time a day for 2 days starting the day after chemotherapy. Take with food.   docusate sodium 50 MG capsule Commonly known as:  COLACE Take 1 capsule (50 mg total) by mouth 2 (two) times daily as needed (for constipation).   furosemide 40 MG tablet Commonly known as:  LASIX Take 1 tablet (40 mg total) by mouth 2 (two) times daily.   gabapentin 300 MG capsule Commonly known as:  NEURONTIN Take 2 capsules (600 mg total) by mouth 3 (three) times daily.   KLOR-CON M20 20 MEQ tablet Generic drug:  potassium chloride SA TAKE 1 TABLET EVERY DAY   lidocaine 2 % solution Commonly known as:  XYLOCAINE   magic mouthwash w/lidocaine Soln Take 5 mLs by mouth 4 (four) times daily as needed for mouth pain.   metFORMIN 500 MG tablet Commonly known as:  GLUCOPHAGE TAKE 1 TABLET BY MOUTH TWICE A DAY WITH A MEAL   metoprolol succinate 100 MG 24 hr tablet Commonly known as:  TOPROL-XL TAKE 1 TABLET BY MOUTH EVERY DAY IMMEDIATELY FOLLOWING A MEAL   nitroGLYCERIN 0.4 MG SL tablet Commonly known as:  NITROSTAT Place 1 tablet (0.4 mg total) under the tongue every 5 (five) minutes as needed.  Chest pain   nystatin 100000 UNIT/ML suspension Commonly known as:  MYCOSTATIN   ondansetron 4 MG tablet Commonly known as:  ZOFRAN Take 1 tablet (4 mg total) by mouth every 8 (eight) hours as needed for nausea or vomiting.   ondansetron 8 MG tablet Commonly known as:  ZOFRAN Take 1 tablet (8 mg total) by mouth 2 (two) times daily as needed. Start on day 6 of chemotherapy.   oxybutynin 15 MG 24 hr tablet Commonly known as:  DITROPAN XL Take 15 mg by mouth at bedtime.   oxymetazoline 0.05 % nasal spray Commonly known as:  AFRIN Place 1-2 sprays into both nostrils 2 (two) times daily as needed for congestion.   pantoprazole 40 MG tablet Commonly known as:  PROTONIX TAKE 1 TABLET BY MOUTH EVERY DAY   polyethylene glycol powder powder Commonly known as:  MIRALAX Take 17 g by mouth 2 (two) times daily as needed.   pramipexole 1.5 MG tablet Commonly known as:  MIRAPEX Take two tablets every night.   prochlorperazine 10 MG tablet Commonly known as:  COMPAZINE Take 1 tablet (10 mg total) by mouth every 6 (six) hours as needed (Nausea or vomiting).   tamsulosin 0.4 MG Caps capsule Commonly known as:  FLOMAX Take 0.4 mg by mouth at bedtime.   temazepam 30 MG capsule Commonly known as:  RESTORIL TAKE ONE CAPSULE AT BEDTIME AS NEEDED SLEEP   TOUJEO SOLOSTAR 300 UNIT/ML Sopn Generic drug:  Insulin Glargine INJECT 20 UNITS INTO THE SKIN DAILY AFTER BREAKFAST.   Trospium Chloride 60 MG Cp24 Take 60 mg by mouth daily.   vitamin B-12 1000 MCG tablet Commonly known as:  CYANOCOBALAMIN Take 1,000 mcg by mouth daily.   VITAMIN B-3 PO Take by mouth daily.   warfarin 5 MG tablet Commonly known as:  COUMADIN Take as directed by the anticoagulation clinic. If you are unsure how to take this medication, talk to your nurse or doctor. Original instructions:  Take as directed by anticoagulation clinic       Allergies:  Allergies  Allergen Reactions  . Ace Inhibitors Other  (See Comments)    cough  . Codeine Nausea Only and Rash       . Penicillins Rash    Childhood allergy Has patient had a PCN reaction causing immediate rash, facial/tongue/throat swelling, SOB or lightheadedness with hypotension: Yes Has patient had a PCN reaction causing severe rash involving mucus membranes or skin necrosis: Yes Has patient had a PCN reaction that required hospitalization No Has patient had a PCN reaction occurring within the last 10 years: No If all of the above answers are "NO", then may proceed with Cephalosporin use.     Past Medical History, Surgical history, Social history, and Family History were reviewed and updated.  Review of Systems: All other 10 point  review of systems is negative.   Physical Exam:  weight is 234 lb (106.1 kg). His oral temperature is 98.2 F (36.8 C). His blood pressure is 108/56 (abnormal) and his pulse is 72. His respiration is 20 and oxygen saturation is 98%.   Wt Readings from Last 3 Encounters:  01/30/17 234 lb (106.1 kg)  01/24/17 213 lb 1.9 oz (96.7 kg)  01/10/17 232 lb 8 oz (105.5 kg)    Ocular: Sclerae unicteric, pupils equal, round and reactive to light Ear-nose-throat: Oropharynx clear, dentition fair Lymphatic: No cervical, supraclavicular or axillary adenopathy Lungs no rales or rhonchi, good excursion bilaterally Heart regular rate and rhythm, no murmur appreciated Abd soft, nontender, positive bowel sounds, no liver or spleen tip palpated on exam, no fluid wave  MSK no focal spinal tenderness, no joint edema Neuro: non-focal, well-oriented, appropriate affect Breasts: Deferred   Lab Results  Component Value Date   WBC 3.3 (L) 01/30/2017   HGB 10.5 (L) 01/10/2017   HCT 27.1 (L) 01/30/2017   MCV 97.1 01/30/2017   PLT 49 (L) 01/30/2017   Lab Results  Component Value Date   FERRITIN 77 08/23/2016   IRON 61 08/23/2016   TIBC 273 08/23/2016   UIBC 212 08/23/2016   IRONPCTSAT 22 08/23/2016   Lab Results    Component Value Date   RBC 2.79 (L) 01/30/2017   No results found for: KPAFRELGTCHN, LAMBDASER, KAPLAMBRATIO No results found for: IGGSERUM, IGA, IGMSERUM No results found for: Odetta Pink, SPEI   Chemistry      Component Value Date/Time   NA 139 01/24/2017 1005   NA 141 01/10/2017 1136   NA 137 12/14/2015 1100   K 3.9 01/24/2017 1005   K 4.4 01/10/2017 1136   K 3.7 12/14/2015 1100   CL 101 01/24/2017 1005   CL 104 01/10/2017 1136   CO2 26 01/24/2017 1005   CO2 27 01/10/2017 1136   CO2 24 12/14/2015 1100   BUN 15 01/24/2017 1005   BUN 19 01/10/2017 1136   BUN 18.4 12/14/2015 1100   CREATININE 1.1 01/10/2017 1136   CREATININE 0.9 12/14/2015 1100      Component Value Date/Time   CALCIUM 8.7 01/24/2017 1005   CALCIUM 9.4 01/10/2017 1136   CALCIUM 9.5 12/14/2015 1100   ALKPHOS 94 (H) 01/24/2017 1005   ALKPHOS 112 (H) 01/10/2017 1136   ALKPHOS 102 12/14/2015 1100   AST 16 01/24/2017 1005   AST 22 12/14/2015 1100   ALT 22 01/24/2017 1005   ALT 18 01/10/2017 1136   ALT 10 12/14/2015 1100   BILITOT 0.8 01/24/2017 1005   BILITOT 0.58 12/14/2015 1100      Impression and Plan: Mr. Hyun is a very pleasant 84 yo caucasian gentleman with relapsed large cell non-Hodgkin's lymphoma of the testicle. He completed his first cycle of dose reduced R-ICE and his WBC count dropped to 0.8. He is back today feeling fatigued but his lab work is looking better. WBC count is now 3.3, Hgb stable at 8.9 and platelet count 49.  We will plan to see him back in next week for cycle 2 of treatment and plan to repeat a PET scan after that.  He will contact our office with any questions or concerns. We can certainly see him sooner if need be.   Laverna Peace, NP 1/24/201912:02 PM

## 2017-02-04 ENCOUNTER — Other Ambulatory Visit: Payer: Self-pay | Admitting: *Deleted

## 2017-02-04 DIAGNOSIS — C8589 Other specified types of non-Hodgkin lymphoma, extranodal and solid organ sites: Secondary | ICD-10-CM

## 2017-02-05 ENCOUNTER — Inpatient Hospital Stay: Payer: Medicare Other

## 2017-02-05 ENCOUNTER — Inpatient Hospital Stay (HOSPITAL_BASED_OUTPATIENT_CLINIC_OR_DEPARTMENT_OTHER): Payer: Medicare Other | Admitting: Hematology & Oncology

## 2017-02-05 ENCOUNTER — Other Ambulatory Visit: Payer: Self-pay

## 2017-02-05 ENCOUNTER — Encounter: Payer: Self-pay | Admitting: Hematology & Oncology

## 2017-02-05 VITALS — BP 115/59 | HR 73 | Temp 97.3°F | Resp 18

## 2017-02-05 DIAGNOSIS — Z7984 Long term (current) use of oral hypoglycemic drugs: Secondary | ICD-10-CM

## 2017-02-05 DIAGNOSIS — Z880 Allergy status to penicillin: Secondary | ICD-10-CM

## 2017-02-05 DIAGNOSIS — R600 Localized edema: Secondary | ICD-10-CM

## 2017-02-05 DIAGNOSIS — D696 Thrombocytopenia, unspecified: Secondary | ICD-10-CM | POA: Diagnosis not present

## 2017-02-05 DIAGNOSIS — Z7901 Long term (current) use of anticoagulants: Secondary | ICD-10-CM

## 2017-02-05 DIAGNOSIS — R531 Weakness: Secondary | ICD-10-CM

## 2017-02-05 DIAGNOSIS — C8589 Other specified types of non-Hodgkin lymphoma, extranodal and solid organ sites: Secondary | ICD-10-CM

## 2017-02-05 DIAGNOSIS — Z79899 Other long term (current) drug therapy: Secondary | ICD-10-CM | POA: Diagnosis not present

## 2017-02-05 DIAGNOSIS — M549 Dorsalgia, unspecified: Secondary | ICD-10-CM

## 2017-02-05 DIAGNOSIS — Z923 Personal history of irradiation: Secondary | ICD-10-CM | POA: Diagnosis not present

## 2017-02-05 DIAGNOSIS — J029 Acute pharyngitis, unspecified: Secondary | ICD-10-CM

## 2017-02-05 LAB — CMP (CANCER CENTER ONLY)
ALBUMIN: 3.1 g/dL — AB (ref 3.5–5.0)
ALK PHOS: 86 U/L — AB (ref 26–84)
ALT: 11 U/L (ref 0–55)
AST: 18 U/L (ref 5–34)
Anion gap: 9 (ref 5–15)
BUN: 15 mg/dL (ref 7–22)
CHLORIDE: 106 mmol/L (ref 98–108)
CO2: 27 mmol/L (ref 18–33)
CREATININE: 0.9 mg/dL (ref 0.70–1.30)
Calcium: 9.6 mg/dL (ref 8.0–10.3)
GLUCOSE: 129 mg/dL — AB (ref 70–118)
Potassium: 3.9 mmol/L (ref 3.3–4.7)
SODIUM: 142 mmol/L (ref 128–145)
TOTAL PROTEIN: 6.3 g/dL — AB (ref 6.4–8.1)
Total Bilirubin: 0.5 mg/dL (ref 0.2–1.2)

## 2017-02-05 LAB — CBC WITH DIFFERENTIAL (CANCER CENTER ONLY)
BASOS ABS: 0.1 10*3/uL (ref 0.0–0.1)
BASOS PCT: 1 %
EOS PCT: 0 %
Eosinophils Absolute: 0 10*3/uL (ref 0.0–0.5)
HCT: 27 % — ABNORMAL LOW (ref 38.7–49.9)
HEMOGLOBIN: 8.9 g/dL — AB (ref 13.0–17.1)
Lymphocytes Relative: 14 %
Lymphs Abs: 0.5 10*3/uL — ABNORMAL LOW (ref 0.9–3.3)
MCH: 31.4 pg (ref 28.0–33.4)
MCHC: 33 g/dL (ref 32.0–35.9)
MCV: 95.4 fL (ref 82.0–98.0)
MONO ABS: 0.6 10*3/uL (ref 0.1–0.9)
MONOS PCT: 16 %
NEUTROS ABS: 2.5 10*3/uL (ref 1.5–6.5)
Neutrophils Relative %: 69 %
Platelet Count: 132 10*3/uL — ABNORMAL LOW (ref 140–400)
RBC: 2.83 MIL/uL — ABNORMAL LOW (ref 4.20–5.70)
RDW: 14.2 % (ref 11.1–15.7)
WBC Count: 3.6 10*3/uL — ABNORMAL LOW (ref 4.0–10.3)

## 2017-02-05 LAB — PROTIME-INR
INR: 0.95
PROTHROMBIN TIME: 12.6 s (ref 11.4–15.2)

## 2017-02-05 MED ORDER — DIPHENHYDRAMINE HCL 25 MG PO CAPS
50.0000 mg | ORAL_CAPSULE | Freq: Once | ORAL | Status: AC
  Administered 2017-02-05: 50 mg via ORAL

## 2017-02-05 MED ORDER — SODIUM CHLORIDE 0.9% FLUSH
10.0000 mL | INTRAVENOUS | Status: DC | PRN
  Administered 2017-02-05: 10 mL
  Filled 2017-02-05: qty 10

## 2017-02-05 MED ORDER — SODIUM CHLORIDE 0.9 % IV SOLN
Freq: Once | INTRAVENOUS | Status: AC
  Administered 2017-02-05: 16:00:00 via INTRAVENOUS
  Filled 2017-02-05: qty 51

## 2017-02-05 MED ORDER — SODIUM CHLORIDE 0.9 % IV SOLN
80.0000 mg/m2 | Freq: Once | INTRAVENOUS | Status: AC
  Administered 2017-02-05: 180 mg via INTRAVENOUS
  Filled 2017-02-05: qty 9

## 2017-02-05 MED ORDER — ACETAMINOPHEN 325 MG PO TABS
650.0000 mg | ORAL_TABLET | Freq: Once | ORAL | Status: AC
  Administered 2017-02-05: 650 mg via ORAL

## 2017-02-05 MED ORDER — HEPARIN SOD (PORK) LOCK FLUSH 100 UNIT/ML IV SOLN
500.0000 [IU] | Freq: Once | INTRAVENOUS | Status: AC | PRN
  Administered 2017-02-05: 500 [IU]
  Filled 2017-02-05: qty 5

## 2017-02-05 MED ORDER — PALONOSETRON HCL INJECTION 0.25 MG/5ML
0.2500 mg | Freq: Once | INTRAVENOUS | Status: AC
  Administered 2017-02-05: 0.25 mg via INTRAVENOUS

## 2017-02-05 MED ORDER — SODIUM CHLORIDE 0.9 % IV SOLN
Freq: Once | INTRAVENOUS | Status: AC
  Administered 2017-02-05: 10:00:00 via INTRAVENOUS

## 2017-02-05 MED ORDER — SODIUM CHLORIDE 0.9 % IV SOLN
375.0000 mg/m2 | Freq: Once | INTRAVENOUS | Status: AC
  Administered 2017-02-05: 900 mg via INTRAVENOUS
  Filled 2017-02-05: qty 90

## 2017-02-05 MED ORDER — SODIUM CHLORIDE 0.9 % IV SOLN: Freq: Once | INTRAVENOUS | Status: AC

## 2017-02-05 MED ORDER — SODIUM CHLORIDE 0.9 % IV SOLN
400.0000 mg/m2 | Freq: Once | INTRAVENOUS | Status: AC
  Administered 2017-02-05: 900 mg via INTRAVENOUS
  Filled 2017-02-05: qty 9

## 2017-02-05 MED ORDER — DEXAMETHASONE SODIUM PHOSPHATE 10 MG/ML IJ SOLN
10.0000 mg | Freq: Once | INTRAMUSCULAR | Status: AC
  Administered 2017-02-05: 10 mg via INTRAVENOUS

## 2017-02-05 NOTE — Patient Instructions (Signed)
Implanted Port Home Guide An implanted port is a type of central line that is placed under the skin. Central lines are used to provide IV access when treatment or nutrition needs to be given through a person's veins. Implanted ports are used for long-term IV access. An implanted port may be placed because:  You need IV medicine that would be irritating to the small veins in your hands or arms.  You need long-term IV medicines, such as antibiotics.  You need IV nutrition for a long period.  You need frequent blood draws for lab tests.  You need dialysis.  Implanted ports are usually placed in the chest area, but they can also be placed in the upper arm, the abdomen, or the leg. An implanted port has two main parts:  Reservoir. The reservoir is round and will appear as a small, raised area under your skin. The reservoir is the part where a needle is inserted to give medicines or draw blood.  Catheter. The catheter is a thin, flexible tube that extends from the reservoir. The catheter is placed into a large vein. Medicine that is inserted into the reservoir goes into the catheter and then into the vein.  How will I care for my incision site? Do not get the incision site wet. Bathe or shower as directed by your health care provider. How is my port accessed? Special steps must be taken to access the port:  Before the port is accessed, a numbing cream can be placed on the skin. This helps numb the skin over the port site.  Your health care provider uses a sterile technique to access the port. ? Your health care provider must put on a mask and sterile gloves. ? The skin over your port is cleaned carefully with an antiseptic and allowed to dry. ? The port is gently pinched between sterile gloves, and a needle is inserted into the port.  Only "non-coring" port needles should be used to access the port. Once the port is accessed, a blood return should be checked. This helps ensure that the port  is in the vein and is not clogged.  If your port needs to remain accessed for a constant infusion, a clear (transparent) bandage will be placed over the needle site. The bandage and needle will need to be changed every week, or as directed by your health care provider.  Keep the bandage covering the needle clean and dry. Do not get it wet. Follow your health care provider's instructions on how to take a shower or bath while the port is accessed.  If your port does not need to stay accessed, no bandage is needed over the port.  What is flushing? Flushing helps keep the port from getting clogged. Follow your health care provider's instructions on how and when to flush the port. Ports are usually flushed with saline solution or a medicine called heparin. The need for flushing will depend on how the port is used.  If the port is used for intermittent medicines or blood draws, the port will need to be flushed: ? After medicines have been given. ? After blood has been drawn. ? As part of routine maintenance.  If a constant infusion is running, the port may not need to be flushed.  How long will my port stay implanted? The port can stay in for as long as your health care provider thinks it is needed. When it is time for the port to come out, surgery will be   done to remove it. The procedure is similar to the one performed when the port was put in. When should I seek immediate medical care? When you have an implanted port, you should seek immediate medical care if:  You notice a bad smell coming from the incision site.  You have swelling, redness, or drainage at the incision site.  You have more swelling or pain at the port site or the surrounding area.  You have a fever that is not controlled with medicine.  This information is not intended to replace advice given to you by your health care provider. Make sure you discuss any questions you have with your health care provider. Document  Released: 12/24/2004 Document Revised: 06/01/2015 Document Reviewed: 08/31/2012 [employer] Interactive Patient Education  2017 [employer]  

## 2017-02-05 NOTE — Progress Notes (Signed)
Hematology and Oncology Follow Up Visit  Gary Yates [id number] 1933-09-22 84 y.o. 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  He actually tolerated the first cycle of RICE quite well.  He did have some mucositis.  This resolved with some mouth rinse.  We stopped his Plavix and Coumadin.  He was thrombocytopenic.  I do not want him to bleed.  He was at high risk for falling.  I told him to restart the Coumadin.  He will continue to hold the Plavix right now.  He has had no problems with nausea or vomiting.  He has had no diarrhea.  He has had some leg swelling which is chronic.  He has had chronic back problems.  There is been no obvious bleeding.  He has had no blurred vision.  Overall, his performance status is ECOG 2.  Medications:  Current Outpatient Medications:  .  ACCU-CHEK AVIVA PLUS test strip, TEST 3 TIMES A DAY (Patient taking differently: TEST 2 TIMES A DAY), Disp: 300 each, Rfl: 5 .  acetaminophen (TYLENOL) 500 MG tablet, Take 1,000 mg by mouth every 6 (six) hours as needed (for pain/fever/headaches.). , Disp: , Rfl:  .  atorvastatin (LIPITOR) 40 MG tablet, TAKE 1 TABLET BY MOUTH DAILY, Disp: 90 tablet, Rfl: 2 .  ciprofloxacin (CIPRO) 500 MG tablet, Take 1 tablet (500 mg total) by mouth 2 (two) times daily., Disp: 14 tablet, Rfl: 1 .  clopidogrel (PLAVIX) 75 MG tablet, TAKE 1 TABLET BY MOUTH DAILY (Patient not taking: Reported on 01/30/2017), Disp: 30 tablet, Rfl: 5 .  dexamethasone (DECADRON) 4 MG tablet, Take 2 tablets one time a day for 2 days starting the day after chemotherapy. Take with  food., Disp: 30 tablet, Rfl: 1 .  docusate sodium (COLACE) 50 MG capsule, Take 1 capsule (50 mg total) by mouth 2 (two) times daily as needed (for constipation)., Disp: 90 capsule, Rfl: 0 .  furosemide (LASIX) 40 MG tablet, Take 1 tablet (40 mg total) by mouth 2 (two) times daily., Disp: 60 tablet, Rfl: 0 .  gabapentin (NEURONTIN) 300 MG capsule, Take 2 capsules (600 mg total) by mouth 3 (three) times daily., Disp: 540 capsule, Rfl: 2 .  KLOR-CON M20 20 MEQ tablet, TAKE 1 TABLET BY MOUTH EVERY DAY, Disp: 90 tablet, Rfl: 0 .  lidocaine (XYLOCAINE) 2 % solution, , Disp: , Rfl:  .  magic mouthwash w/lidocaine SOLN, Take 5 mLs by mouth 4 (four) times daily as needed for mouth pain. (Patient not taking: Reported on 01/30/2017), Disp: 600 mL, Rfl: 3 .  metFORMIN (GLUCOPHAGE) 500 MG tablet, TAKE 1 TABLET BY MOUTH TWICE A DAY WITH A MEAL, Disp: 180 tablet, Rfl: 2 .  metoprolol succinate (TOPROL-XL) 100 MG 24 hr tablet, TAKE 1 TABLET BY MOUTH EVERY DAY IMMEDIATELY FOLLOWING A MEAL, Disp: 90 tablet, Rfl: 1 .  Multiple Vitamins-Minerals (CENTRUM SILVER 50+MEN) TABS, Take 1 tablet by mouth daily., Disp: , Rfl:  .  Niacin (VITAMIN B-3 PO), Take by mouth daily., Disp: , Rfl:  .  nitroGLYCERIN (NITROSTAT) 0.4 MG SL tablet, Place 1 tablet (  0.4 mg total) under the tongue every 5 (five) minutes as needed. Chest pain (Patient not taking: Reported on 01/30/2017), Disp: 25 tablet, Rfl: 6 .  nystatin (MYCOSTATIN) 100000 UNIT/ML suspension, , Disp: , Rfl:  .  ondansetron (ZOFRAN) 4 MG tablet, Take 1 tablet (4 mg total) by mouth every 8 (eight) hours as needed for nausea or vomiting. (Patient not taking: Reported on 01/30/2017), Disp: 30 tablet, Rfl: 0 .  ondansetron (ZOFRAN) 8 MG tablet, Take 1 tablet (8 mg total) by mouth 2 (two) times daily as needed. Start on day 6 of chemotherapy., Disp: 30 tablet, Rfl: 1 .  oxybutynin (DITROPAN XL) 15 MG 24 hr tablet, Take 15 mg by mouth at bedtime. , Disp: , Rfl:  .  oxymetazoline  (AFRIN) 0.05 % nasal spray, Place 1-2 sprays into both nostrils 2 (two) times daily as needed for congestion., Disp: , Rfl:  .  pantoprazole (PROTONIX) 40 MG tablet, TAKE 1 TABLET BY MOUTH EVERY DAY, Disp: 90 tablet, Rfl: 3 .  polyethylene glycol powder (MIRALAX) powder, Take 17 g by mouth 2 (two) times daily as needed., Disp: 255 g, Rfl: 0 .  pramipexole (MIRAPEX) 1.5 MG tablet, Take two tablets every night., Disp: 180 tablet, Rfl: 3 .  prochlorperazine (COMPAZINE) 10 MG tablet, Take 1 tablet (10 mg total) by mouth every 6 (six) hours as needed (Nausea or vomiting). (Patient not taking: Reported on 01/30/2017), Disp: 30 tablet, Rfl: 1 .  tamsulosin (FLOMAX) 0.4 MG CAPS capsule, Take 0.4 mg by mouth at bedtime., Disp: , Rfl:  .  temazepam (RESTORIL) 30 MG capsule, TAKE ONE CAPSULE AT BEDTIME AS NEEDED SLEEP, Disp: 15 capsule, Rfl: 0 .  TOUJEO SOLOSTAR 300 UNIT/ML SOPN, INJECT 20 UNITS INTO THE SKIN DAILY AFTER BREAKFAST., Disp: 15 mL, Rfl: 1 .  Trospium Chloride 60 MG CP24, Take 60 mg by mouth daily., Disp: , Rfl:  .  vitamin B-12 (CYANOCOBALAMIN) 1000 MCG tablet, Take 1,000 mcg by mouth daily., Disp: , Rfl:  .  warfarin (COUMADIN) 5 MG tablet, Take as directed by anticoagulation clinic (Patient not taking: Reported on 01/30/2017), Disp: 50 tablet, Rfl: 1 No current facility-administered medications for this visit.   Facility-Administered Medications Ordered in Other Visits:  .  acetaminophen (TYLENOL) tablet 650 mg, 650 mg, Oral, Once, [place], Sarah M, NP .  testosterone cypionate (DEPOTESTOTERONE CYPIONATE) injection 200 mg, 200 mg, Intramuscular, Q28 days, [name], MD, 200 mg at 12/26/11 0850 .  testosterone cypionate (DEPOTESTOTERONE CYPIONATE) injection 200 mg, 200 mg, Intramuscular, Q28 days, [name], Alinda Sierras, MD, 200 mg at 01/23/12 0908 .  testosterone cypionate (DEPOTESTOTERONE CYPIONATE) injection 200 mg, 200 mg, Intramuscular, Q28 days, [name], Alinda Sierras, MD, 200 mg at  02/25/12 0848 .  testosterone cypionate (DEPOTESTOTERONE CYPIONATE) injection 200 mg, 200 mg, Intramuscular, Q28 days, [name], MD, 200 mg at 06/03/12 1437  Allergies:  Allergies  Allergen Reactions  . Ace Inhibitors Other (See Comments)    cough  . Codeine Nausea Only and Rash       . Penicillins Rash    Childhood allergy Has patient had a PCN reaction causing immediate rash, facial/tongue/throat swelling, SOB or lightheadedness with hypotension: Yes Has patient had a PCN reaction causing severe rash involving mucus membranes or skin necrosis: Yes Has patient had a PCN reaction that required hospitalization No Has patient had a PCN reaction occurring within the last 10 years: No If all of the above answers are "NO", then may proceed with Cephalosporin use.  Past Medical History, Surgical history, Social history, and Family History were reviewed and updated.  Review of Systems: Review of Systems  HENT: Positive for sinus pain and sore throat.   Eyes: Positive for blurred vision.  Respiratory: Positive for wheezing.   Cardiovascular: Positive for leg swelling.  Gastrointestinal: Negative.   Genitourinary: Negative.   Musculoskeletal: Positive for back pain.  Skin: Negative.   Neurological: Positive for weakness.  Endo/Heme/Allergies: Negative.   Psychiatric/Behavioral: Negative.     Physical Exam:  vitals were not taken for this visit.   Wt Readings from Last 3 Encounters:  02/05/17 237 lb (107.5 kg)  01/30/17 234 lb (106.1 kg)  01/24/17 213 lb 1.9 oz (96.7 kg)      Physical Exam  Constitutional: He is oriented to person, place, and time.  HENT:  Head: Normocephalic and atraumatic.  Mouth/Throat: Oropharynx is clear and moist.  Eyes: EOM are normal. Pupils are equal, round, and reactive to light.  Neck: Normal range of motion.  Cardiovascular: Normal rate, regular rhythm and normal heart sounds.  Pulmonary/Chest: Effort normal and breath sounds  normal.  Abdominal: Soft. Bowel sounds are normal.  Musculoskeletal: Normal range of motion. He exhibits no edema, tenderness or deformity.  Lymphadenopathy:    He has no cervical adenopathy.  Neurological: He is alert and oriented to person, place, and time.  Skin: Skin is warm and dry. No rash noted. No erythema.  Psychiatric: He has a normal mood and affect. His behavior is normal. Judgment and thought content normal.  Vitals reviewed.    Lab Results  Component Value Date   WBC 3.6 (L) 02/05/2017   HGB 10.5 (L) 01/10/2017   HCT 27.0 (L) 02/05/2017   MCV 95.4 02/05/2017   PLT 132 (L) 02/05/2017     Chemistry      Component Value Date/Time   NA 143 01/30/2017 1120   NA 141 01/10/2017 1136   NA 137 12/14/2015 1100   K 3.9 01/30/2017 1120   K 4.4 01/10/2017 1136   K 3.7 12/14/2015 1100   CL 109 (H) 01/30/2017 1120   CL 104 01/10/2017 1136   CO2 27 01/30/2017 1120   CO2 27 01/10/2017 1136   CO2 24 12/14/2015 1100   BUN 20 01/30/2017 1120   BUN 19 01/10/2017 1136   BUN 18.4 12/14/2015 1100   CREATININE 1.1 01/10/2017 1136   CREATININE 0.9 12/14/2015 1100      Component Value Date/Time   CALCIUM 9.5 01/30/2017 1120   CALCIUM 9.4 01/10/2017 1136   CALCIUM 9.5 12/14/2015 1100   ALKPHOS 125 (H) 01/30/2017 1120   ALKPHOS 112 (H) 01/10/2017 1136   ALKPHOS 102 12/14/2015 1100   AST 22 01/30/2017 1120   AST 22 12/14/2015 1100   ALT 12 01/30/2017 1120   ALT 18 01/10/2017 1136   ALT 10 12/14/2015 1100   BILITOT 0.4 01/30/2017 1120   BILITOT 0.58 12/14/2015 1100      Impression and Plan: Mr. Kalb is an 84 year old white male. He has relapsed large cell non-Hodgkin lymphoma of the testicle. He was on salvage chemotherapy with Rituxan/bendamustine/Velcade. Again, he is not a candidate for aggressive intervention. He definitely is not a candidate for stem cell transplantation.  We will proceed with his second cycle of RICE.  We will see what his PET scan shows after this  cycle.  Hopefully, we will see that his disease has responded and that he is no longer PET positive.  Again, we will have him restart  the Coumadin.  I think this would be reasonable to do.  I still want him to hold his Plavix.  Again, we will give just 2 days and not 3 days of the RICE.  We will set his PET scan up for 2 weeks.  I will plan to see him back in about 10 days.  I want to make sure we see him back at close to his nadir so we can see how his blood counts look.  So far, we have not had to transfuse him.  I spent about 30 minutes with he and his wife.  Over 50% of this time was face-to-face.  I answered their questions.  I offered them my recommendations and plan for the future.  They both agree.  Volanda Napoleon, MD 1/30/20198:39 AM

## 2017-02-05 NOTE — Patient Instructions (Signed)
Etoposide, VP-16 injection What is this medicine? ETOPOSIDE, VP-16 (e toe POE side) is a chemotherapy drug. It is used to treat testicular cancer, lung cancer, and other cancers. This medicine may be used for other purposes; ask your health care provider or pharmacist if you have questions. COMMON BRAND NAME(S): Etopophos, Toposar, VePesid What should I tell my health care provider before I take this medicine? They need to know if you have any of these conditions: -infection -kidney disease -liver disease -low blood counts, like low white cell, platelet, or red cell counts -an unusual or allergic reaction to etoposide, other medicines, foods, dyes, or preservatives -pregnant or trying to get pregnant -breast-feeding How should I use this medicine? This medicine is for infusion into a vein. It is administered in a hospital or clinic by a specially trained health care professional. Talk to your pediatrician regarding the use of this medicine in children. Special care may be needed. Overdosage: If you think you have taken too much of this medicine contact a poison control center or emergency room at once. NOTE: This medicine is only for you. Do not share this medicine with others. What if I miss a dose? It is important not to miss your dose. Call your doctor or health care professional if you are unable to keep an appointment. What may interact with this medicine? -aspirin -certain medications for seizures like carbamazepine, phenobarbital, phenytoin, valproic acid -cyclosporine -levamisole -warfarin This list may not describe all possible interactions. Give your health care provider a list of all the medicines, herbs, non-prescription drugs, or dietary supplements you use. Also tell them if you smoke, drink alcohol, or use illegal drugs. Some items may interact with your medicine. What should I watch for while using this medicine? Visit your doctor for checks on your progress. This drug  may make you feel generally unwell. This is not uncommon, as chemotherapy can affect healthy cells as well as cancer cells. Report any side effects. Continue your course of treatment even though you feel ill unless your doctor tells you to stop. In some cases, you may be given additional medicines to help with side effects. Follow all directions for their use. Call your doctor or health care professional for advice if you get a fever, chills or sore throat, or other symptoms of a cold or flu. Do not treat yourself. This drug decreases your body's ability to fight infections. Try to avoid being around people who are sick. This medicine may increase your risk to bruise or bleed. Call your doctor or health care professional if you notice any unusual bleeding. Talk to your doctor about your risk of cancer. You may be more at risk for certain types of cancers if you take this medicine. Do not become pregnant while taking this medicine or for at least 6 months after stopping it. Women should inform their doctor if they wish to become pregnant or think they might be pregnant. Women of child-bearing potential will need to have a negative pregnancy test before starting this medicine. There is a potential for serious side effects to an unborn child. Talk to your health care professional or pharmacist for more information. Do not breast-feed an infant while taking this medicine. Men must use a latex condom during sexual contact with a woman while taking this medicine and for at least 4 months after stopping it. A latex condom is needed even if you have had a vasectomy. Contact your doctor right away if your partner becomes pregnant. Do   not donate sperm while taking this medicine and for at least 4 months after you stop taking this medicine. Men should inform their doctors if they wish to father a child. This medicine may lower sperm counts. What side effects may I notice from receiving this medicine? Side effects that  you should report to your doctor or health care professional as soon as possible: -allergic reactions like skin rash, itching or hives, swelling of the face, lips, or tongue -low blood counts - this medicine may decrease the number of white blood cells, red blood cells and platelets. You may be at increased risk for infections and bleeding. -signs of infection - fever or chills, cough, sore throat, pain or difficulty passing urine -signs of decreased platelets or bleeding - bruising, pinpoint red spots on the skin, black, tarry stools, blood in the urine -signs of decreased red blood cells - unusually weak or tired, fainting spells, lightheadedness -breathing problems -changes in vision -mouth or throat sores or ulcers -pain, redness, swelling or irritation at the injection site -pain, tingling, numbness in the hands or feet -redness, blistering, peeling or loosening of the skin, including inside the mouth -seizures -vomiting Side effects that usually do not require medical attention (report to your doctor or health care professional if they continue or are bothersome): -diarrhea -hair loss -loss of appetite -nausea -stomach pain This list may not describe all possible side effects. Call your doctor for medical advice about side effects. You may report side effects to FDA at 1-800-FDA-1088. Where should I keep my medicine? This drug is given in a hospital or clinic and will not be stored at home. NOTE: This sheet is a summary. It may not cover all possible information. If you have questions about this medicine, talk to your doctor, pharmacist, or health care provider.  2018 Else[employer]/Gold Standard (2014-12-16 11:53:23) Ifosfamide injection What is this medicine? IFOSFAMIDE (eye FOS fa mide) is a chemotherapy drug. It slows the growth of cancer cells. This medicine is used to treat testicular cancer. This medicine may be used for other purposes; ask your health care provider or pharmacist  if you have questions. COMMON BRAND NAME(S): Ifex, Ifex/Mesna What should I tell my health care provider before I take this medicine? They need to know if you have any of these conditions: -bladder problems -blood disorders -dehydration -infection (especially a virus infection such as chickenpox, cold sores, or herpes) -kidney disease -liver disease -recent or ongoing radiation therapy -an unusual or allergic reaction to ifosfamide, other chemotherapy, other medicines, foods, dyes, or preservatives -pregnant or trying to get pregnant -breast-feeding How should I use this medicine? This drug is given as an infusion into a vein. It is administered in a hospital or clinic by a specially trained health care professional. Talk to your pediatrician regarding the use of this medicine in children. Special care may be needed. Overdosage: If you think you have taken too much of this medicine contact a poison control center or emergency room at once. NOTE: This medicine is only for you. Do not share this medicine with others. What if I miss a dose? It is important not to miss your dose. Call your doctor or health care professional if you are unable to keep an appointment. What may interact with this medicine? Do not take this medicine with any of the following medications: -nalidixic acid This medicine may also interact with the following medications: -medicines to increase blood counts like filgrastim, pegfilgrastim, sargramostim -St. John's Wort -vaccines Talk to  your doctor or health care professional before taking any of these medicines: -acetaminophen -aspirin -ibuprofen -ketoprofen -naproxen This list may not describe all possible interactions. Give your health care provider a list of all the medicines, herbs, non-prescription drugs, or dietary supplements you use. Also tell them if you smoke, drink alcohol, or use illegal drugs. Some items may interact with your medicine. What should I  watch for while using this medicine? Visit your doctor for checks on your progress. This drug may make you feel generally unwell. This is not uncommon, as chemotherapy can affect healthy cells as well as cancer cells. Report any side effects. Continue your course of treatment even though you feel ill unless your doctor tells you to stop. Drink water or other fluids as directed. Urinate often, even at night. In some cases, you may be given additional medicines to help with side effects. Follow all directions for their use. Call your doctor or health care professional for advice if you get a fever, chills or sore throat, or other symptoms of a cold or flu. Do not treat yourself. This drug decreases your body's ability to fight infections. Try to avoid being around people who are sick. This medicine may increase your risk to bruise or bleed. Call your doctor or health care professional if you notice any unusual bleeding. Be careful brushing and flossing your teeth or using a toothpick because you may get an infection or bleed more easily. If you have any dental work done, tell your dentist you are receiving this medicine. Avoid taking products that contain aspirin, acetaminophen, ibuprofen, naproxen, or ketoprofen unless instructed by your doctor. These medicines may hide a fever. Do not become pregnant while taking this medicine. Women should inform their doctor if they wish to become pregnant or think they might be pregnant. There is a potential for serious side effects to an unborn child. Talk to your health care professional or pharmacist for more information. Do not breast-feed an infant while taking this medicine. What side effects may I notice from receiving this medicine? Side effects that you should report to your doctor or health care professional as soon as possible: -allergic reactions like skin rash, itching or hives, swelling of the face, lips, or tongue -low blood counts - this medicine may  decrease the number of white blood cells, red blood cells and platelets. You may be at increased risk for infections and bleeding. -signs of infection - fever or chills, cough, sore throat, pain or difficulty passing urine -signs of decreased platelets or bleeding - bruising, pinpoint red spots on the skin, black, tarry stools, blood in the urine -signs of decreased red blood cells - unusually weak or tired, fainting spells, lightheadedness -agitation -breathing problems -confusion -dark urine -hallucinations -mouth sores -pain, swelling, redness at site where injected -seizures -trouble passing urine or change in the amount of urine -yellowing of the eyes or skin Side effects that usually do not require medical attention (report to your doctor or health care professional if they continue or are bothersome): -diarrhea -hair loss -loss of appetite -nausea, vomiting This list may not describe all possible side effects. Call your doctor for medical advice about side effects. You may report side effects to FDA at 1-800-FDA-1088. Where should I keep my medicine? This drug is given in a hospital or clinic and will not be stored at home. NOTE: This sheet is a summary. It may not cover all possible information. If you have questions about this medicine, talk to  your doctor, pharmacist, or health care provider.  2018 Else[employer]/Gold Standard (2007-05-12 16:23:05) Mesna injection What is this medicine? MESNA (MES na) is used to prevent bleeding from the bladder during treatment with ifosfamide. This medicine does not reduce the chance of getting other side effects of cancer chemotherapy. This medicine may be used for other purposes; ask your health care provider or pharmacist if you have questions. COMMON BRAND NAME(S): Mesnex What should I tell my health care provider before I take this medicine? They need to know if you have any of these conditions: -autoimmune disease like lupus, nephritis, or  rheumatoid arthritis -an unusual or allergic reaction to mesna, benzyl alcohol, sulfur medicines, other medicines, foods, dyes, or preservatives -pregnant or trying to get pregnant -breast-feeding How should I use this medicine? This medicine is for infusion into a vein. It is given by a health care professional in a hospital or clinic setting. Talk to your pediatrician regarding the use of this medicine in children. Special care may be needed. Overdosage: If you think you have taken too much of this medicine contact a poison control center or emergency room at once. NOTE: This medicine is only for you. Do not share this medicine with others. What if I miss a dose? This does not apply. What may interact with this medicine? Interactions are not expected. This list may not describe all possible interactions. Give your health care provider a list of all the medicines, herbs, non-prescription drugs, or dietary supplements you use. Also tell them if you smoke, drink alcohol, or use illegal drugs. Some items may interact with your medicine. What should I watch for while using this medicine? Your doctor will follow your condition closely while you are taking this medicine. Tell your doctor right away if you see that your urine has turned a pink or red color. It is important to drink at least a quart (4 cups) of fluids each day that you take this medicine. What side effects may I notice from receiving this medicine? Side effects that you should report to your doctor or health care professional as soon as possible: -allergic reactions like skin rash, itching or hives, swelling of the face, lips, or tongue -breathing problems -blood in your urine or pink to red colored urine -fever, chills, or sore throat -flushing or redness to skin -mouth sores -pain or redness at site where injected -swelling of ankles or feet -vomiting Side effects that usually do not require medical attention (report to your  doctor or health care professional if they continue or are bothersome): -aches and pains -bad taste in mouth -diarrhea -dizziness -hair loss -headache -nausea This list may not describe all possible side effects. Call your doctor for medical advice about side effects. You may report side effects to FDA at 1-800-FDA-1088. Where should I keep my medicine? This drug is given in a hospital or clinic and will not be stored at home. NOTE: This sheet is a summary. It may not cover all possible information. If you have questions about this medicine, talk to your doctor, pharmacist, or health care provider.  2018 Elsevier/Gold Standard (2007-07-13 16:43:56) Rituximab injection What is this medicine? RITUXIMAB (ri TUX i mab) is a monoclonal antibody. It is used to treat certain types of cancer like non-Hodgkin lymphoma and chronic lymphocytic leukemia. It is also used to treat rheumatoid arthritis, granulomatosis with polyangiitis (or Wegener's granulomatosis), and microscopic polyangiitis. This medicine may be used for other purposes; ask your health care provider or pharmacist if  you have questions. COMMON BRAND NAME(S): Rituxan What should I tell my health care provider before I take this medicine? They need to know if you have any of these conditions: -heart disease -infection (especially a virus infection such as hepatitis B, chickenpox, cold sores, or herpes) -immune system problems -irregular heartbeat -kidney disease -lung or breathing disease, like asthma -recently received or scheduled to receive a vaccine -an unusual or allergic reaction to rituximab, mouse proteins, other medicines, foods, dyes, or preservatives -pregnant or trying to get pregnant -breast-feeding How should I use this medicine? This medicine is for infusion into a vein. It is administered in a hospital or clinic by a specially trained health care professional. A special MedGuide will be given to you by the  pharmacist with each prescription and refill. Be sure to read this information carefully each time. Talk to your pediatrician regarding the use of this medicine in children. This medicine is not approved for use in children. Overdosage: If you think you have taken too much of this medicine contact a poison control center or emergency room at once. NOTE: This medicine is only for you. Do not share this medicine with others. What if I miss a dose? It is important not to miss a dose. Call your doctor or health care professional if you are unable to keep an appointment. What may interact with this medicine? -cisplatin -other medicines for arthritis like disease modifying antirheumatic drugs or tumor necrosis factor inhibitors -live virus vaccines This list may not describe all possible interactions. Give your health care provider a list of all the medicines, herbs, non-prescription drugs, or dietary supplements you use. Also tell them if you smoke, drink alcohol, or use illegal drugs. Some items may interact with your medicine. What should I watch for while using this medicine? Your condition will be monitored carefully while you are receiving this medicine. You may need blood work done while you are taking this medicine. This medicine can cause serious allergic reactions. To reduce your risk you may need to take medicine before treatment with this medicine. Take your medicine as directed. In some patients, this medicine may cause a serious brain infection that may cause death. If you have any problems seeing, thinking, speaking, walking, or standing, tell your doctor right away. If you cannot reach your doctor, urgently seek other source of medical care. Call your doctor or health care professional for advice if you get a fever, chills or sore throat, or other symptoms of a cold or flu. Do not treat yourself. This drug decreases your body's ability to fight infections. Try to avoid being around people  who are sick. Do not become pregnant while taking this medicine or for 12 months after stopping it. Women should inform their doctor if they wish to become pregnant or think they might be pregnant. There is a potential for serious side effects to an unborn child. Talk to your health care professional or pharmacist for more information. What side effects may I notice from receiving this medicine? Side effects that you should report to your doctor or health care professional as soon as possible: -breathing problems -chest pain -dizziness or feeling faint -fast, irregular heartbeat -low blood counts - this medicine may decrease the number of white blood cells, red blood cells and platelets. You may be at increased risk for infections and bleeding. -mouth sores -redness, blistering, peeling or loosening of the skin, including inside the mouth (this can be added for any serious or exfoliative rash  that could lead to hospitalization) -signs of infection - fever or chills, cough, sore throat, pain or difficulty passing urine -signs and symptoms of kidney injury like trouble passing urine or change in the amount of urine -signs and symptoms of liver injury like dark yellow or brown urine; general ill feeling or flu-like symptoms; light-colored stools; loss of appetite; nausea; right upper belly pain; unusually weak or tired; yellowing of the eyes or skin -stomach pain -vomiting Side effects that usually do not require medical attention (report to your doctor or health care professional if they continue or are bothersome): -headache -joint pain -muscle cramps or muscle pain This list may not describe all possible side effects. Call your doctor for medical advice about side effects. You may report side effects to FDA at 1-800-FDA-1088. Where should I keep my medicine? This drug is given in a hospital or clinic and will not be stored at home. NOTE: This sheet is a summary. It may not cover all possible  information. If you have questions about this medicine, talk to your doctor, pharmacist, or health care provider.  2018 Elsevier/Gold Standard (2015-08-02 15:28:09)

## 2017-02-06 ENCOUNTER — Inpatient Hospital Stay: Payer: Medicare Other

## 2017-02-06 ENCOUNTER — Other Ambulatory Visit: Payer: Self-pay

## 2017-02-06 VITALS — BP 142/75 | HR 87 | Temp 97.8°F | Resp 20

## 2017-02-06 DIAGNOSIS — C8589 Other specified types of non-Hodgkin lymphoma, extranodal and solid organ sites: Secondary | ICD-10-CM

## 2017-02-06 MED ORDER — SODIUM CHLORIDE 0.9% FLUSH
10.0000 mL | INTRAVENOUS | Status: DC | PRN
  Administered 2017-02-06: 10 mL
  Filled 2017-02-06: qty 10

## 2017-02-06 MED ORDER — DEXAMETHASONE SODIUM PHOSPHATE 10 MG/ML IJ SOLN
10.0000 mg | Freq: Once | INTRAMUSCULAR | Status: AC
  Administered 2017-02-06: 10 mg via INTRAVENOUS

## 2017-02-06 MED ORDER — SODIUM CHLORIDE 0.9 % IV SOLN
Freq: Once | INTRAVENOUS | Status: AC
  Administered 2017-02-06: 12:00:00 via INTRAVENOUS
  Filled 2017-02-06: qty 51

## 2017-02-06 MED ORDER — HEPARIN SOD (PORK) LOCK FLUSH 100 UNIT/ML IV SOLN
500.0000 [IU] | Freq: Once | INTRAVENOUS | Status: AC | PRN
  Administered 2017-02-06: 500 [IU]
  Filled 2017-02-06: qty 5

## 2017-02-06 MED ORDER — PEGFILGRASTIM 6 MG/0.6ML ~~LOC~~ PSKT
6.0000 mg | PREFILLED_SYRINGE | Freq: Once | SUBCUTANEOUS | Status: AC
  Administered 2017-02-06: 6 mg via SUBCUTANEOUS

## 2017-02-06 MED ORDER — ETOPOSIDE CHEMO INJECTION 1 GM/50ML
80.0000 mg/m2 | Freq: Once | INTRAVENOUS | Status: AC
  Administered 2017-02-06: 180 mg via INTRAVENOUS
  Filled 2017-02-06: qty 9

## 2017-02-06 MED ORDER — SODIUM CHLORIDE 0.9 % IV SOLN
Freq: Once | INTRAVENOUS | Status: AC
  Administered 2017-02-06: 09:00:00 via INTRAVENOUS

## 2017-02-06 MED ORDER — SODIUM CHLORIDE 0.9 % IV SOLN
400.0000 mg/m2 | Freq: Once | INTRAVENOUS | Status: AC
  Administered 2017-02-06: 900 mg via INTRAVENOUS
  Filled 2017-02-06: qty 9

## 2017-02-06 MED ORDER — SODIUM CHLORIDE 0.9 % IV SOLN
414.4000 mg | Freq: Once | INTRAVENOUS | Status: AC
  Administered 2017-02-06: 410 mg via INTRAVENOUS
  Filled 2017-02-06: qty 41

## 2017-02-06 NOTE — Progress Notes (Signed)
At end of Etoposide, pt complaining of "feeling hot" in his face.  Patient's face flushed with no other complaints.  VS stable.  Pt instructed to notify RN if any additional symptoms arise.   At start of Mesna, pt states that flushing feels "better" and face is now pink in color.  No other complaints at this time.

## 2017-02-06 NOTE — Patient Instructions (Signed)
[facility] Discharge Instructions for Patients Receiving Chemotherapy  Today you received the following chemotherapy agents: Etoposide, Mesna, Carboplatin and Ifosfamide  To help prevent nausea and vomiting after your treatment, we encourage you to take your nausea medication as ordered per MD.    If you develop nausea and vomiting that is not controlled by your nausea medication, call the clinic.   BELOW ARE SYMPTOMS THAT SHOULD BE REPORTED IMMEDIATELY:  *FEVER GREATER THAN 100.5 F  *CHILLS WITH OR WITHOUT FEVER  NAUSEA AND VOMITING THAT IS NOT CONTROLLED WITH YOUR NAUSEA MEDICATION  *UNUSUAL SHORTNESS OF BREATH  *UNUSUAL BRUISING OR BLEEDING  TENDERNESS IN MOUTH AND THROAT WITH OR WITHOUT PRESENCE OF ULCERS  *URINARY PROBLEMS  *BOWEL PROBLEMS  UNUSUAL RASH Items with * indicate a potential emergency and should be followed up as soon as possible.  Feel free to call the clinic should you have any questions or concerns. The clinic phone number is [phone].  Please show the [facility] at check-in to the Emergency Department and triage nurse.

## 2017-02-07 ENCOUNTER — Ambulatory Visit: Payer: Medicare Other

## 2017-02-10 ENCOUNTER — Ambulatory Visit: Payer: Medicare Other

## 2017-02-13 ENCOUNTER — Other Ambulatory Visit: Payer: Self-pay | Admitting: *Deleted

## 2017-02-13 MED ORDER — GELCLAIR MT GEL: 1.0000 | Freq: Three times a day (TID) | OROMUCOSAL | 2 refills | Status: DC | PRN

## 2017-02-17 ENCOUNTER — Inpatient Hospital Stay: Payer: Medicare Other | Attending: Hematology & Oncology | Admitting: Family

## 2017-02-17 ENCOUNTER — Encounter: Payer: Self-pay | Admitting: Family

## 2017-02-17 ENCOUNTER — Inpatient Hospital Stay: Payer: Medicare Other

## 2017-02-17 ENCOUNTER — Other Ambulatory Visit: Payer: Self-pay

## 2017-02-17 VITALS — BP 120/59 | HR 78 | Temp 98.0°F | Wt 223.1 lb

## 2017-02-17 DIAGNOSIS — D649 Anemia, unspecified: Secondary | ICD-10-CM | POA: Diagnosis not present

## 2017-02-17 DIAGNOSIS — Z9225 Personal history of immunosupression therapy: Secondary | ICD-10-CM | POA: Diagnosis not present

## 2017-02-17 DIAGNOSIS — Z5112 Encounter for antineoplastic immunotherapy: Secondary | ICD-10-CM | POA: Insufficient documentation

## 2017-02-17 DIAGNOSIS — C8589 Other specified types of non-Hodgkin lymphoma, extranodal and solid organ sites: Secondary | ICD-10-CM

## 2017-02-17 DIAGNOSIS — J3489 Other specified disorders of nose and nasal sinuses: Secondary | ICD-10-CM | POA: Insufficient documentation

## 2017-02-17 DIAGNOSIS — Z7984 Long term (current) use of oral hypoglycemic drugs: Secondary | ICD-10-CM | POA: Insufficient documentation

## 2017-02-17 DIAGNOSIS — G629 Polyneuropathy, unspecified: Secondary | ICD-10-CM | POA: Diagnosis not present

## 2017-02-17 DIAGNOSIS — R0609 Other forms of dyspnea: Secondary | ICD-10-CM

## 2017-02-17 DIAGNOSIS — Z79899 Other long term (current) drug therapy: Secondary | ICD-10-CM | POA: Diagnosis not present

## 2017-02-17 DIAGNOSIS — Z880 Allergy status to penicillin: Secondary | ICD-10-CM | POA: Insufficient documentation

## 2017-02-17 DIAGNOSIS — R600 Localized edema: Secondary | ICD-10-CM

## 2017-02-17 DIAGNOSIS — R5383 Other fatigue: Secondary | ICD-10-CM | POA: Diagnosis not present

## 2017-02-17 DIAGNOSIS — R682 Dry mouth, unspecified: Secondary | ICD-10-CM

## 2017-02-17 DIAGNOSIS — Z923 Personal history of irradiation: Secondary | ICD-10-CM | POA: Diagnosis not present

## 2017-02-17 DIAGNOSIS — R634 Abnormal weight loss: Secondary | ICD-10-CM | POA: Diagnosis not present

## 2017-02-17 DIAGNOSIS — Z7901 Long term (current) use of anticoagulants: Secondary | ICD-10-CM | POA: Diagnosis not present

## 2017-02-17 DIAGNOSIS — H538 Other visual disturbances: Secondary | ICD-10-CM | POA: Diagnosis not present

## 2017-02-17 DIAGNOSIS — R531 Weakness: Secondary | ICD-10-CM

## 2017-02-17 DIAGNOSIS — Z9221 Personal history of antineoplastic chemotherapy: Secondary | ICD-10-CM | POA: Diagnosis not present

## 2017-02-17 DIAGNOSIS — Z5111 Encounter for antineoplastic chemotherapy: Secondary | ICD-10-CM | POA: Insufficient documentation

## 2017-02-17 DIAGNOSIS — K5900 Constipation, unspecified: Secondary | ICD-10-CM | POA: Diagnosis not present

## 2017-02-17 DIAGNOSIS — M549 Dorsalgia, unspecified: Secondary | ICD-10-CM | POA: Insufficient documentation

## 2017-02-17 DIAGNOSIS — Z7689 Persons encountering health services in other specified circumstances: Secondary | ICD-10-CM | POA: Diagnosis not present

## 2017-02-17 LAB — CMP (CANCER CENTER ONLY)
ALT: 7 U/L (ref 0–55)
AST: 20 U/L (ref 5–34)
Albumin: 3.2 g/dL — ABNORMAL LOW (ref 3.5–5.0)
Alkaline Phosphatase: 110 U/L — ABNORMAL HIGH (ref 26–84)
Anion gap: 8 (ref 5–15)
BILIRUBIN TOTAL: 0.5 mg/dL (ref 0.2–1.2)
BUN: 11 mg/dL (ref 7–22)
CALCIUM: 8.9 mg/dL (ref 8.0–10.3)
CO2: 25 mmol/L (ref 18–33)
Chloride: 106 mmol/L (ref 98–108)
Creatinine: 0.9 mg/dL (ref 0.70–1.30)
Glucose, Bld: 130 mg/dL — ABNORMAL HIGH (ref 73–118)
Potassium: 4.1 mmol/L (ref 3.3–4.7)
Sodium: 139 mmol/L (ref 128–145)
TOTAL PROTEIN: 6.1 g/dL — AB (ref 6.4–8.1)

## 2017-02-17 LAB — PREPARE RBC (CROSSMATCH)

## 2017-02-17 LAB — CBC WITH DIFFERENTIAL (CANCER CENTER ONLY)
BASOS ABS: 0 10*3/uL (ref 0.0–0.1)
BASOS PCT: 0 %
Eosinophils Absolute: 0 10*3/uL (ref 0.0–0.5)
Eosinophils Relative: 0 %
HEMATOCRIT: 24.9 % — AB (ref 38.7–49.9)
HEMOGLOBIN: 8.3 g/dL — AB (ref 13.0–17.1)
Lymphocytes Relative: 10 %
Lymphs Abs: 0.5 10*3/uL — ABNORMAL LOW (ref 0.9–3.3)
MCH: 31.8 pg (ref 28.0–33.4)
MCHC: 33.3 g/dL (ref 32.0–35.9)
MCV: 95.4 fL (ref 82.0–98.0)
Monocytes Absolute: 0.5 10*3/uL (ref 0.1–0.9)
Monocytes Relative: 11 %
NEUTROS PCT: 79 %
Neutro Abs: 4 10*3/uL (ref 1.5–6.5)
Platelet Count: 64 10*3/uL — ABNORMAL LOW (ref 145–400)
RBC: 2.61 MIL/uL — ABNORMAL LOW (ref 4.20–5.70)
RDW: 14.5 % (ref 11.1–15.7)
WBC Count: 5.1 10*3/uL (ref 4.0–10.0)

## 2017-02-17 LAB — PROTIME-INR
INR: 1.49
Prothrombin Time: 17.9 seconds — ABNORMAL HIGH (ref 11.4–15.2)

## 2017-02-17 LAB — TECHNOLOGIST SMEAR REVIEW

## 2017-02-17 LAB — SAVE SMEAR

## 2017-02-17 LAB — LACTATE DEHYDROGENASE: LDH: 183 U/L (ref 125–245)

## 2017-02-17 MED ORDER — SODIUM CHLORIDE 0.9% FLUSH
10.0000 mL | INTRAVENOUS | Status: DC | PRN
  Administered 2017-02-17: 10 mL via INTRAVENOUS
  Filled 2017-02-17: qty 10

## 2017-02-17 MED ORDER — HEPARIN SOD (PORK) LOCK FLUSH 100 UNIT/ML IV SOLN
500.0000 [IU] | Freq: Once | INTRAVENOUS | Status: AC
  Administered 2017-02-17: 500 [IU] via INTRAVENOUS
  Filled 2017-02-17: qty 5

## 2017-02-17 MED ORDER — LACTULOSE 10 GM/15ML PO SOLN: 10.0000 g | Freq: Four times a day (QID) | ORAL | 0 refills | Status: DC | PRN

## 2017-02-17 NOTE — Patient Instructions (Signed)
Implanted Port Insertion, Care After °This sheet gives you information about how to care for yourself after your procedure. Your health care provider may also give you more specific instructions. If you have problems or questions, contact your health care provider. °What can I expect after the procedure? °After your procedure, it is common to have: °· Discomfort at the port insertion site. °· Bruising on the skin over the port. This should improve over 3-4 days. ° °Follow these instructions at home: °Port care °· After your port is placed, you will get a manufacturer's information card. The card has information about your port. Keep this card with you at all times. °· Take care of the port as told by your health care provider. Ask your health care provider if you or a family member can get training for taking care of the port at home. A home health care nurse may also take care of the port. °· Make sure to remember what type of port you have. °Incision care °· Follow instructions from your health care provider about how to take care of your port insertion site. Make sure you: °? Wash your hands with soap and water before you change your bandage (dressing). If soap and water are not available, use hand sanitizer. °? Change your dressing as told by your health care provider. °? Leave stitches (sutures), skin glue, or adhesive strips in place. These skin closures may need to stay in place for 2 weeks or longer. If adhesive strip edges start to loosen and curl up, you may trim the loose edges. Do not remove adhesive strips completely unless your health care provider tells you to do that. °· Check your port insertion site every day for signs of infection. Check for: °? More redness, swelling, or pain. °? More fluid or blood. °? Warmth. °? Pus or a bad smell. °General instructions °· Do not take baths, swim, or use a hot tub until your health care provider approves. °· Do not lift anything that is heavier than 10 lb (4.5  kg) for a week, or as told by your health care provider. °· Ask your health care provider when it is okay to: °? Return to work or school. °? Resume usual physical activities or sports. °· Do not drive for 24 hours if you were given a medicine to help you relax (sedative). °· Take over-the-counter and prescription medicines only as told by your health care provider. °· Wear a medical alert bracelet in case of an emergency. This will tell any health care providers that you have a port. °· Keep all follow-up visits as told by your health care provider. This is important. °Contact a health care provider if: °· You cannot flush your port with saline as directed, or you cannot draw blood from the port. °· You have a fever or chills. °· You have more redness, swelling, or pain around your port insertion site. °· You have more fluid or blood coming from your port insertion site. °· Your port insertion site feels warm to the touch. °· You have pus or a bad smell coming from the port insertion site. °Get help right away if: °· You have chest pain or shortness of breath. °· You have bleeding from your port that you cannot control. °Summary °· Take care of the port as told by your health care provider. °· Change your dressing as told by your health care provider. °· Keep all follow-up visits as told by your health care provider. °  This information is not intended to replace advice given to you by your health care provider. Make sure you discuss any questions you have with your health care provider. °Document Released: 10/14/2012 Document Revised: 11/15/2015 Document Reviewed: 11/15/2015 °Elsevier Interactive Patient Education © 2017 [employer] ° °

## 2017-02-17 NOTE — Progress Notes (Signed)
Hematology and Oncology Follow Up Visit  Gary Yates [id number] 15-Nov-1933 84 y.o. 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 He is feeling fatigued and weak. His Hgb is down to 8.3 and platelet count 64.  He denies having any episodes of bleeding, no bruising or petechiae. No lymphadenopathy found on exam.  He has SOB with over exertion. He has dry mouth, no sores. This has effected his appetite and he is not eating as well. His weight is down 14 lbs since his last visit. He is trying to hydrate well at home. He is not in any visible distress at this time.  He will be having his PET scan on Friday 2/15.  No fever, chills, n/v, cough, rash, dizziness, chest pain, palpitations, abdominal pain or changes in bladder habits.  He has had constipation not relieved with over the counter stool softeners. We will have him try Lactulose.  He has a ruptured disc in his back and is using a rolling walker when ambulating for support. He denies having had any falls or syncopal episodes.  The swelling in his lower extremities is unchanged. This improves when he takes lasix. Neuropathy in his feet is the same. Neurontin helps.    ECOG Performance Status: 2 - Symptomatic, <50% confined to bed  Medications:  Allergies as of 02/17/2017      Reactions   Ace Inhibitors Other (See Comments)   cough   Codeine Nausea Only, Rash      Penicillins Rash   Childhood allergy Has patient had a PCN reaction causing immediate rash, facial/tongue/throat swelling, SOB or lightheadedness with hypotension: Yes Has patient had a PCN  reaction causing severe rash involving mucus membranes or skin necrosis: Yes Has patient had a PCN reaction that required hospitalization No Has patient had a PCN reaction occurring within the last 10 years: No If all of the above answers are "NO", then may proceed with Cephalosporin use.      Medication List        Accurate as of 02/17/17  2:20 PM. Always use your most recent med list.          ACCU-CHEK AVIVA PLUS test strip Generic drug:  glucose blood TEST 3 TIMES A DAY   acetaminophen 500 MG tablet Commonly known as:  TYLENOL Take 1,000 mg by mouth every 6 (six) hours as needed (for pain/fever/headaches.).   atorvastatin 40 MG tablet Commonly known as:  LIPITOR TAKE 1 TABLET BY MOUTH DAILY   CENTRUM SILVER 50+MEN Tabs Take 1 tablet by mouth daily.   ciprofloxacin 500 MG tablet Commonly known as:  CIPRO Take 1 tablet (500 mg total) by mouth 2 (two) times daily.   clopidogrel 75 MG tablet Commonly known as:  PLAVIX TAKE 1 TABLET BY MOUTH DAILY   dexamethasone 4 MG tablet Commonly known as:  DECADRON Take 2 tablets one time a day for 2 days starting the day after chemotherapy. Take with food.   docusate sodium 50 MG capsule Commonly known as:  COLACE Take 1 capsule (50 mg total) by mouth 2 (two) times daily as needed (for  constipation).   furosemide 40 MG tablet Commonly known as:  LASIX Take 1 tablet (40 mg total) by mouth 2 (two) times daily.   gabapentin 300 MG capsule Commonly known as:  NEURONTIN Take 2 capsules (600 mg total) by mouth 3 (three) times daily.   KLOR-CON M20 20 MEQ tablet Generic drug:  potassium chloride SA TAKE 1 TABLET BY MOUTH EVERY DAY   lidocaine 2 % solution Commonly known as:  XYLOCAINE   magic mouthwash w/lidocaine Soln Take 5 mLs by mouth 4 (four) times daily as needed for mouth pain.   metFORMIN 500 MG tablet Commonly known as:  GLUCOPHAGE TAKE 1 TABLET BY MOUTH TWICE A DAY WITH A MEAL   metoprolol succinate 100 MG 24  hr tablet Commonly known as:  TOPROL-XL TAKE 1 TABLET BY MOUTH EVERY DAY IMMEDIATELY FOLLOWING A MEAL   mucosal barrier oral Gel Take 1 packet by mouth 3 (three) times daily as needed.   nitroGLYCERIN 0.4 MG SL tablet Commonly known as:  NITROSTAT Place 1 tablet (0.4 mg total) under the tongue every 5 (five) minutes as needed. Chest pain   nystatin 100000 UNIT/ML suspension Commonly known as:  MYCOSTATIN   ondansetron 4 MG tablet Commonly known as:  ZOFRAN Take 1 tablet (4 mg total) by mouth every 8 (eight) hours as needed for nausea or vomiting.   ondansetron 8 MG tablet Commonly known as:  ZOFRAN Take 1 tablet (8 mg total) by mouth 2 (two) times daily as needed. Start on day 6 of chemotherapy.   oxybutynin 15 MG 24 hr tablet Commonly known as:  DITROPAN XL Take 15 mg by mouth at bedtime.   oxymetazoline 0.05 % nasal spray Commonly known as:  AFRIN Place 1-2 sprays into both nostrils 2 (two) times daily as needed for congestion.   pantoprazole 40 MG tablet Commonly known as:  PROTONIX TAKE 1 TABLET BY MOUTH EVERY DAY   polyethylene glycol powder powder Commonly known as:  MIRALAX Take 17 g by mouth 2 (two) times daily as needed.   pramipexole 1.5 MG tablet Commonly known as:  MIRAPEX Take two tablets every night.   prochlorperazine 10 MG tablet Commonly known as:  COMPAZINE Take 1 tablet (10 mg total) by mouth every 6 (six) hours as needed (Nausea or vomiting).   tamsulosin 0.4 MG Caps capsule Commonly known as:  FLOMAX Take 0.4 mg by mouth at bedtime.   temazepam 30 MG capsule Commonly known as:  RESTORIL TAKE ONE CAPSULE AT BEDTIME AS NEEDED SLEEP   TOUJEO SOLOSTAR 300 UNIT/ML Sopn Generic drug:  Insulin Glargine INJECT 20 UNITS INTO THE SKIN DAILY AFTER BREAKFAST.   Trospium Chloride 60 MG Cp24 Take 60 mg by mouth daily.   vitamin B-12 1000 MCG tablet Commonly known as:  CYANOCOBALAMIN Take 1,000 mcg by mouth daily.   VITAMIN B-3 PO Take by mouth  daily.   warfarin 5 MG tablet Commonly known as:  COUMADIN Take as directed by the anticoagulation clinic. If you are unsure how to take this medication, talk to your nurse or doctor. Original instructions:  Take as directed by anticoagulation clinic       Allergies:  Allergies  Allergen Reactions  . Ace Inhibitors Other (See Comments)    cough  . Codeine Nausea Only and Rash       . Penicillins Rash    Childhood allergy Has patient had a PCN reaction causing immediate rash, facial/tongue/throat swelling, SOB or lightheadedness with hypotension: Yes Has patient had  a PCN reaction causing severe rash involving mucus membranes or skin necrosis: Yes Has patient had a PCN reaction that required hospitalization No Has patient had a PCN reaction occurring within the last 10 years: No If all of the above answers are "NO", then may proceed with Cephalosporin use.     Past Medical History, Surgical history, Social history, and Family History were reviewed and updated.  Review of Systems: All other 10 point review of systems is negative.   Physical Exam:  weight is 223 lb 1.6 oz (101.2 kg). His oral temperature is 98 F (36.7 C). His blood pressure is 120/59 (abnormal) and his pulse is 78.   Wt Readings from Last 3 Encounters:  02/17/17 223 lb 1.6 oz (101.2 kg)  02/05/17 237 lb (107.5 kg)  01/30/17 234 lb (106.1 kg)    Ocular: Sclerae unicteric, pupils equal, round and reactive to light Ear-nose-throat: Oropharynx clear, dentition fair Lymphatic: No cervical, supraclavicular or axillary adenopathy Lungs no rales or rhonchi, good excursion bilaterally Heart regular rate and rhythm, no murmur appreciated Abd soft, nontender, positive bowel sounds, no liver or spleen tip palpated on exam, no fluid wave  MSK no focal spinal tenderness, no joint edema Neuro: non-focal, well-oriented, appropriate affect Breasts: Deferred   Lab Results  Component Value Date   WBC 5.1 02/17/2017     HGB 10.5 (L) 01/10/2017   HCT 24.9 (L) 02/17/2017   MCV 95.4 02/17/2017   PLT 64 (L) 02/17/2017   Lab Results  Component Value Date   FERRITIN 77 08/23/2016   IRON 61 08/23/2016   TIBC 273 08/23/2016   UIBC 212 08/23/2016   IRONPCTSAT 22 08/23/2016   Lab Results  Component Value Date   RBC 2.61 (L) 02/17/2017   No results found for: KPAFRELGTCHN, LAMBDASER, KAPLAMBRATIO No results found for: IGGSERUM, IGA, IGMSERUM No results found for: Odetta Pink, SPEI   Chemistry      Component Value Date/Time   NA 142 02/05/2017 0825   NA 141 01/10/2017 1136   NA 137 12/14/2015 1100   K 3.9 02/05/2017 0825   K 4.4 01/10/2017 1136   K 3.7 12/14/2015 1100   CL 106 02/05/2017 0825   CL 104 01/10/2017 1136   CO2 27 02/05/2017 0825   CO2 27 01/10/2017 1136   CO2 24 12/14/2015 1100   BUN 15 02/05/2017 0825   BUN 19 01/10/2017 1136   BUN 18.4 12/14/2015 1100   CREATININE 0.90 02/05/2017 0825   CREATININE 1.1 01/10/2017 1136   CREATININE 0.9 12/14/2015 1100      Component Value Date/Time   CALCIUM 9.6 02/05/2017 0825   CALCIUM 9.4 01/10/2017 1136   CALCIUM 9.5 12/14/2015 1100   ALKPHOS 86 (H) 02/05/2017 0825   ALKPHOS 112 (H) 01/10/2017 1136   ALKPHOS 102 12/14/2015 1100   AST 18 02/05/2017 0825   AST 22 12/14/2015 1100   ALT 11 02/05/2017 0825   ALT 18 01/10/2017 1136   ALT 10 12/14/2015 1100   BILITOT 0.5 02/05/2017 0825   BILITOT 0.58 12/14/2015 1100      Impression and Plan: Mr. Werth is a very pleasant 84 yo caucasian gentleman with relapsed large cell non-Hodgkins lymphoma of the testicle. He has completed 2 cycles of R-ICE. He is symptomatic today with fatigue, weakness, SOB and weight loss.  With a Hgb of 8.3 we will give him 2 units of blood tomorrow.  He will have his PET scan later this week  on Friday 2/15.  He will try lactulose for constipation. He will also try Biotene for dry mouth.  He has his current  schedule and we will plan to see him back on 02/16/17.  Both he and his sweet family know to contact our office with any questions or concerns. We can certainly see him sooner if need be.   Laverna Peace, NP 2/11/20192:20 PM

## 2017-02-18 ENCOUNTER — Inpatient Hospital Stay: Payer: Medicare Other

## 2017-02-18 ENCOUNTER — Other Ambulatory Visit: Payer: Self-pay

## 2017-02-18 DIAGNOSIS — D649 Anemia, unspecified: Secondary | ICD-10-CM

## 2017-02-18 DIAGNOSIS — C8589 Other specified types of non-Hodgkin lymphoma, extranodal and solid organ sites: Secondary | ICD-10-CM

## 2017-02-18 LAB — ABO/RH: ABO/RH(D): B POS

## 2017-02-18 MED ORDER — ACETAMINOPHEN 325 MG PO TABS
650.0000 mg | ORAL_TABLET | Freq: Once | ORAL | Status: AC
  Administered 2017-02-18: 650 mg via ORAL

## 2017-02-18 MED ORDER — FUROSEMIDE 10 MG/ML IJ SOLN: 20.0000 mg | Freq: Once | INTRAMUSCULAR | Status: DC

## 2017-02-18 MED ORDER — SODIUM CHLORIDE 0.9 % IV SOLN
250.0000 mL | Freq: Once | INTRAVENOUS | Status: AC
  Administered 2017-02-18: 250 mL via INTRAVENOUS

## 2017-02-18 MED ORDER — HEPARIN SOD (PORK) LOCK FLUSH 100 UNIT/ML IV SOLN
500.0000 [IU] | Freq: Every day | INTRAVENOUS | Status: AC | PRN
  Administered 2017-02-18: 500 [IU]
  Filled 2017-02-18: qty 5

## 2017-02-18 MED ORDER — DIPHENHYDRAMINE HCL 25 MG PO CAPS
ORAL_CAPSULE | ORAL | Status: AC
  Filled 2017-02-18: qty 1

## 2017-02-18 MED ORDER — SODIUM CHLORIDE 0.9% FLUSH
10.0000 mL | INTRAVENOUS | Status: AC | PRN
  Administered 2017-02-18: 10 mL
  Filled 2017-02-18: qty 10

## 2017-02-18 MED ORDER — DIPHENHYDRAMINE HCL 25 MG PO CAPS
25.0000 mg | ORAL_CAPSULE | Freq: Once | ORAL | Status: AC
  Administered 2017-02-18: 25 mg via ORAL

## 2017-02-18 MED ORDER — ACETAMINOPHEN 325 MG PO TABS
ORAL_TABLET | ORAL | Status: AC
  Filled 2017-02-18: qty 2

## 2017-02-19 ENCOUNTER — Encounter: Payer: Self-pay | Admitting: Hematology & Oncology

## 2017-02-19 LAB — TYPE AND SCREEN
ABO/RH(D): B POS
ANTIBODY SCREEN: NEGATIVE
UNIT DIVISION: 0
UNIT DIVISION: 0

## 2017-02-19 LAB — BPAM RBC
BLOOD PRODUCT EXPIRATION DATE: 201902122359
BLOOD PRODUCT EXPIRATION DATE: 201902242359
ISSUE DATE / TIME: 201902120834
ISSUE DATE / TIME: 201902120834
Unit Type and Rh: 1700
Unit Type and Rh: 7300

## 2017-02-21 ENCOUNTER — Encounter (HOSPITAL_COMMUNITY): Payer: Self-pay

## 2017-02-21 ENCOUNTER — Encounter (HOSPITAL_COMMUNITY)
Admission: RE | Admit: 2017-02-21 | Discharge: 2017-02-21 | Disposition: A | Discharge status: Home / Self Care | Payer: Medicare Other | Source: Ambulatory Visit | Attending: Hematology & Oncology | Admitting: Hematology & Oncology

## 2017-02-21 DIAGNOSIS — C8589 Other specified types of non-Hodgkin lymphoma, extranodal and solid organ sites: Secondary | ICD-10-CM

## 2017-02-21 LAB — GLUCOSE, CAPILLARY: Glucose-Capillary: 94 mg/dL (ref 65–99)

## 2017-02-21 MED ORDER — FLUDEOXYGLUCOSE F - 18 (FDG) INJECTION
11.1600 | Freq: Once | INTRAVENOUS | Status: AC
  Administered 2017-02-21: 11.16 via INTRAVENOUS

## 2017-02-26 ENCOUNTER — Inpatient Hospital Stay: Payer: Medicare Other

## 2017-02-26 ENCOUNTER — Encounter: Payer: Self-pay | Admitting: Hematology & Oncology

## 2017-02-26 ENCOUNTER — Other Ambulatory Visit: Payer: Self-pay

## 2017-02-26 ENCOUNTER — Inpatient Hospital Stay (HOSPITAL_BASED_OUTPATIENT_CLINIC_OR_DEPARTMENT_OTHER): Payer: Medicare Other | Admitting: Hematology & Oncology

## 2017-02-26 VITALS — BP 105/53 | HR 65 | Temp 98.3°F | Resp 20

## 2017-02-26 VITALS — BP 107/53 | HR 80 | Temp 97.8°F | Resp 18 | Wt 234.8 lb

## 2017-02-26 DIAGNOSIS — R531 Weakness: Secondary | ICD-10-CM | POA: Diagnosis not present

## 2017-02-26 DIAGNOSIS — Z7984 Long term (current) use of oral hypoglycemic drugs: Secondary | ICD-10-CM

## 2017-02-26 DIAGNOSIS — Z880 Allergy status to penicillin: Secondary | ICD-10-CM

## 2017-02-26 DIAGNOSIS — Z923 Personal history of irradiation: Secondary | ICD-10-CM

## 2017-02-26 DIAGNOSIS — Z79899 Other long term (current) drug therapy: Secondary | ICD-10-CM

## 2017-02-26 DIAGNOSIS — C8589 Other specified types of non-Hodgkin lymphoma, extranodal and solid organ sites: Secondary | ICD-10-CM | POA: Diagnosis not present

## 2017-02-26 DIAGNOSIS — J3489 Other specified disorders of nose and nasal sinuses: Secondary | ICD-10-CM

## 2017-02-26 DIAGNOSIS — R600 Localized edema: Secondary | ICD-10-CM

## 2017-02-26 DIAGNOSIS — R5383 Other fatigue: Secondary | ICD-10-CM | POA: Diagnosis not present

## 2017-02-26 DIAGNOSIS — H538 Other visual disturbances: Secondary | ICD-10-CM | POA: Diagnosis not present

## 2017-02-26 DIAGNOSIS — D649 Anemia, unspecified: Secondary | ICD-10-CM

## 2017-02-26 LAB — CBC WITH DIFFERENTIAL (CANCER CENTER ONLY)
Basophils Absolute: 0 10*3/uL (ref 0.0–0.1)
Basophils Relative: 1 %
Eosinophils Absolute: 0 10*3/uL (ref 0.0–0.5)
Eosinophils Relative: 0 %
HCT: 30.2 % — ABNORMAL LOW (ref 38.7–49.9)
HEMOGLOBIN: 10.2 g/dL — AB (ref 13.0–17.1)
LYMPHS ABS: 0.5 10*3/uL — AB (ref 0.9–3.3)
LYMPHS PCT: 14 %
MCH: 31.9 pg (ref 28.0–33.4)
MCHC: 33.8 g/dL (ref 32.0–35.9)
MCV: 94.4 fL (ref 82.0–98.0)
MONOS PCT: 19 %
Monocytes Absolute: 0.7 10*3/uL (ref 0.1–0.9)
NEUTROS ABS: 2.3 10*3/uL (ref 1.5–6.5)
NEUTROS PCT: 66 %
Platelet Count: 82 10*3/uL — ABNORMAL LOW (ref 145–400)
RBC: 3.2 MIL/uL — ABNORMAL LOW (ref 4.20–5.70)
RDW: 15.3 % (ref 11.1–15.7)
WBC Count: 3.5 10*3/uL — ABNORMAL LOW (ref 4.0–10.0)

## 2017-02-26 LAB — CMP (CANCER CENTER ONLY)
ALT: 12 U/L (ref 10–47)
ANION GAP: 10 (ref 5–15)
AST: 21 U/L (ref 11–38)
Albumin: 3.3 g/dL — ABNORMAL LOW (ref 3.5–5.0)
Alkaline Phosphatase: 114 U/L — ABNORMAL HIGH (ref 26–84)
BILIRUBIN TOTAL: 0.5 mg/dL (ref 0.2–1.6)
BUN: 17 mg/dL (ref 7–22)
CHLORIDE: 106 mmol/L (ref 98–108)
CO2: 27 mmol/L (ref 18–33)
Calcium: 9.4 mg/dL (ref 8.0–10.3)
Creatinine: 1.3 mg/dL — ABNORMAL HIGH (ref 0.60–1.20)
Glucose, Bld: 137 mg/dL — ABNORMAL HIGH (ref 73–118)
Potassium: 4.1 mmol/L (ref 3.3–4.7)
Sodium: 143 mmol/L (ref 128–145)
Total Protein: 6.6 g/dL (ref 6.4–8.1)

## 2017-02-26 LAB — PROTIME-INR
INR: 1.92
PROTHROMBIN TIME: 21.8 s — AB (ref 11.4–15.2)

## 2017-02-26 LAB — LACTATE DEHYDROGENASE: LDH: 181 U/L (ref 125–245)

## 2017-02-26 LAB — SAMPLE TO BLOOD BANK

## 2017-02-26 LAB — SAVE SMEAR

## 2017-02-26 MED ORDER — SODIUM CHLORIDE 0.9 % IV SOLN: Freq: Once | INTRAVENOUS | Status: DC

## 2017-02-26 MED ORDER — SODIUM CHLORIDE 0.9% FLUSH
10.0000 mL | INTRAVENOUS | Status: DC | PRN
  Administered 2017-02-26: 10 mL
  Filled 2017-02-26: qty 10

## 2017-02-26 MED ORDER — ACETAMINOPHEN 325 MG PO TABS
650.0000 mg | ORAL_TABLET | Freq: Once | ORAL | Status: AC
  Administered 2017-02-26: 650 mg via ORAL

## 2017-02-26 MED ORDER — DEXAMETHASONE SODIUM PHOSPHATE 10 MG/ML IJ SOLN
INTRAMUSCULAR | Status: AC
  Filled 2017-02-26: qty 1

## 2017-02-26 MED ORDER — DEXAMETHASONE SODIUM PHOSPHATE 10 MG/ML IJ SOLN
10.0000 mg | Freq: Once | INTRAMUSCULAR | Status: AC
  Administered 2017-02-26: 10 mg via INTRAVENOUS

## 2017-02-26 MED ORDER — SODIUM CHLORIDE 0.9 % IV SOLN
Freq: Once | INTRAVENOUS | Status: AC
  Administered 2017-02-26: 09:00:00 via INTRAVENOUS

## 2017-02-26 MED ORDER — SODIUM CHLORIDE 0.9 % IV SOLN
72.0000 mg/m2 | Freq: Once | INTRAVENOUS | Status: AC
  Administered 2017-02-26: 160 mg via INTRAVENOUS
  Filled 2017-02-26: qty 8

## 2017-02-26 MED ORDER — DIPHENHYDRAMINE HCL 25 MG PO CAPS
ORAL_CAPSULE | ORAL | Status: AC
  Filled 2017-02-26: qty 2

## 2017-02-26 MED ORDER — SODIUM CHLORIDE 0.9 % IJ SOLN
10.0000 mL | Freq: Once | INTRAMUSCULAR | Status: DC
  Filled 2017-02-26: qty 10

## 2017-02-26 MED ORDER — ACETAMINOPHEN 325 MG PO TABS
ORAL_TABLET | ORAL | Status: AC
  Filled 2017-02-26: qty 2

## 2017-02-26 MED ORDER — DIPHENHYDRAMINE HCL 25 MG PO CAPS
50.0000 mg | ORAL_CAPSULE | Freq: Once | ORAL | Status: AC
  Administered 2017-02-26: 50 mg via ORAL

## 2017-02-26 MED ORDER — MESNA 100 MG/ML IV SOLN
Freq: Once | INTRAVENOUS | Status: AC
  Administered 2017-02-26: 14:00:00 via INTRAVENOUS
  Filled 2017-02-26: qty 46

## 2017-02-26 MED ORDER — HEPARIN SOD (PORK) LOCK FLUSH 100 UNIT/ML IV SOLN
500.0000 [IU] | Freq: Once | INTRAVENOUS | Status: AC | PRN
  Administered 2017-02-26: 500 [IU]
  Filled 2017-02-26: qty 5

## 2017-02-26 MED ORDER — SODIUM CHLORIDE 0.9 % IV SOLN
400.0000 mg/m2 | Freq: Once | INTRAVENOUS | Status: AC
  Administered 2017-02-26: 900 mg via INTRAVENOUS
  Filled 2017-02-26: qty 9

## 2017-02-26 MED ORDER — PALONOSETRON HCL INJECTION 0.25 MG/5ML
0.2500 mg | Freq: Once | INTRAVENOUS | Status: AC
  Administered 2017-02-26: 0.25 mg via INTRAVENOUS

## 2017-02-26 MED ORDER — SODIUM CHLORIDE 0.9 % IV SOLN
375.0000 mg/m2 | Freq: Once | INTRAVENOUS | Status: AC
  Administered 2017-02-26: 900 mg via INTRAVENOUS
  Filled 2017-02-26: qty 50

## 2017-02-26 MED ORDER — PALONOSETRON HCL INJECTION 0.25 MG/5ML
INTRAVENOUS | Status: AC
  Filled 2017-02-26: qty 5

## 2017-02-26 NOTE — Progress Notes (Signed)
Hematology and Oncology Follow Up Visit  Gary Yates [id number] July 31, 1933 84 y.o. 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  He looks okay.  He had a little bit more difficulty with the second cycle of chemotherapy.  We are giving him R-ICE, but have omitted the third day of treatment because of his age and performance status.  We did do a follow-up PET scan on him.  This was done last week.  Thankfully, it showed that there was no residual lymphoma.  As such, he is in a remission right now.  This is truly amazing.  He has done so well.  He has had a little bit of a tough time tolerating treatment.  Is back still bother him.  He is not sleeping.  All of these are chronic issues.  He has had no bleeding.  He is on Coumadin.  We have to have him off Coumadin for a little bit when his platelet count drops.  He has had no cough.  He has had no diarrhea.  He has had no rashes.  Overall, his performance status is ECOG 2.  Medications:  Current Outpatient Medications:  .  ACCU-CHEK AVIVA PLUS test strip, TEST 3 TIMES A DAY (Patient taking differently: TEST 2 TIMES A DAY), Disp: 300 each, Rfl: 5 .  acetaminophen (TYLENOL) 500 MG tablet, Take 1,000 mg by mouth every 6 (six) hours as needed (for pain/fever/headaches.). , Disp: , Rfl:  .  atorvastatin (LIPITOR) 40 MG tablet, TAKE 1 TABLET BY MOUTH DAILY, Disp: 90 tablet, Rfl: 2 .  ciprofloxacin (CIPRO) 500 MG tablet, Take 1 tablet (500 mg total) by mouth 2 (two) times daily., Disp: 14 tablet, Rfl: 1 .  clopidogrel (PLAVIX) 75 MG tablet, TAKE 1 TABLET BY MOUTH DAILY, Disp: 30 tablet, Rfl:  5 .  dexamethasone (DECADRON) 4 MG tablet, Take 2 tablets one time a day for 2 days starting the day after chemotherapy. Take with food., Disp: 30 tablet, Rfl: 1 .  docusate sodium (COLACE) 50 MG capsule, Take 1 capsule (50 mg total) by mouth 2 (two) times daily as needed (for constipation)., Disp: 90 capsule, Rfl: 0 .  furosemide (LASIX) 40 MG tablet, Take 1 tablet (40 mg total) by mouth 2 (two) times daily., Disp: 60 tablet, Rfl: 0 .  gabapentin (NEURONTIN) 300 MG capsule, Take 2 capsules (600 mg total) by mouth 3 (three) times daily., Disp: 540 capsule, Rfl: 2 .  KLOR-CON M20 20 MEQ tablet, TAKE 1 TABLET BY MOUTH EVERY DAY, Disp: 90 tablet, Rfl: 0 .  lactulose (CHRONULAC) 10 GM/15ML solution, Take 15 mLs (10 g total) by mouth every 6 (six) hours as needed for mild constipation or moderate constipation., Disp: 240 mL, Rfl: 0 .  lidocaine (XYLOCAINE) 2 % solution, , Disp: , Rfl:  .  magic mouthwash w/lidocaine SOLN, Take 5 mLs by mouth 4 (four) times daily as needed for mouth pain., Disp: 600 mL, Rfl: 3 .  metFORMIN (GLUCOPHAGE) 500 MG tablet, TAKE 1 TABLET BY MOUTH TWICE A DAY WITH A MEAL, Disp: 180 tablet, Rfl: 2 .  metoprolol succinate (TOPROL-XL) 100  MG 24 hr tablet, TAKE 1 TABLET BY MOUTH EVERY DAY IMMEDIATELY FOLLOWING A MEAL, Disp: 90 tablet, Rfl: 1 .  mucosal barrier oral (GELCLAIR) GEL, Take 1 packet by mouth 3 (three) times daily as needed., Disp: 60 packet, Rfl: 2 .  Multiple Vitamins-Minerals (CENTRUM SILVER 50+MEN) TABS, Take 1 tablet by mouth daily., Disp: , Rfl:  .  Niacin (VITAMIN B-3 PO), Take by mouth daily., Disp: , Rfl:  .  nitroGLYCERIN (NITROSTAT) 0.4 MG SL tablet, Place 1 tablet (0.4 mg total) under the tongue every 5 (five) minutes as needed. Chest pain, Disp: 25 tablet, Rfl: 6 .  nystatin (MYCOSTATIN) 100000 UNIT/ML suspension, , Disp: , Rfl:  .  ondansetron (ZOFRAN) 4 MG tablet, Take 1 tablet (4 mg total) by mouth every 8 (eight) hours as needed for nausea or vomiting.,  Disp: 30 tablet, Rfl: 0 .  ondansetron (ZOFRAN) 8 MG tablet, Take 1 tablet (8 mg total) by mouth 2 (two) times daily as needed. Start on day 6 of chemotherapy., Disp: 30 tablet, Rfl: 1 .  oxybutynin (DITROPAN XL) 15 MG 24 hr tablet, Take 15 mg by mouth at bedtime. , Disp: , Rfl:  .  oxymetazoline (AFRIN) 0.05 % nasal spray, Place 1-2 sprays into both nostrils 2 (two) times daily as needed for congestion., Disp: , Rfl:  .  pantoprazole (PROTONIX) 40 MG tablet, TAKE 1 TABLET BY MOUTH EVERY DAY, Disp: 90 tablet, Rfl: 3 .  polyethylene glycol powder (MIRALAX) powder, Take 17 g by mouth 2 (two) times daily as needed., Disp: 255 g, Rfl: 0 .  pramipexole (MIRAPEX) 1.5 MG tablet, Take two tablets every night., Disp: 180 tablet, Rfl: 3 .  prochlorperazine (COMPAZINE) 10 MG tablet, Take 1 tablet (10 mg total) by mouth every 6 (six) hours as needed (Nausea or vomiting)., Disp: 30 tablet, Rfl: 1 .  tamsulosin (FLOMAX) 0.4 MG CAPS capsule, Take 0.4 mg by mouth at bedtime., Disp: , Rfl:  .  temazepam (RESTORIL) 30 MG capsule, TAKE ONE CAPSULE AT BEDTIME AS NEEDED SLEEP, Disp: 15 capsule, Rfl: 0 .  TOUJEO SOLOSTAR 300 UNIT/ML SOPN, INJECT 20 UNITS INTO THE SKIN DAILY AFTER BREAKFAST., Disp: 15 mL, Rfl: 1 .  Trospium Chloride 60 MG CP24, Take 60 mg by mouth daily., Disp: , Rfl:  .  vitamin B-12 (CYANOCOBALAMIN) 1000 MCG tablet, Take 1,000 mcg by mouth daily., Disp: , Rfl:  .  warfarin (COUMADIN) 5 MG tablet, Take as directed by anticoagulation clinic, Disp: 50 tablet, Rfl: 1 No current facility-administered medications for this visit.   Facility-Administered Medications Ordered in Other Visits:  .  acetaminophen (TYLENOL) tablet 650 mg, 650 mg, Oral, Once, [place], Sarah M, NP .  testosterone cypionate (DEPOTESTOTERONE CYPIONATE) injection 200 mg, 200 mg, Intramuscular, Q28 days, [name], MD, 200 mg at 12/26/11 0850 .  testosterone cypionate (DEPOTESTOTERONE CYPIONATE) injection 200 mg, 200 mg,  Intramuscular, Q28 days, [name], Alinda Sierras, MD, 200 mg at 01/23/12 0908 .  testosterone cypionate (DEPOTESTOTERONE CYPIONATE) injection 200 mg, 200 mg, Intramuscular, Q28 days, [name], Alinda Sierras, MD, 200 mg at 02/25/12 0848 .  testosterone cypionate (DEPOTESTOTERONE CYPIONATE) injection 200 mg, 200 mg, Intramuscular, Q28 days, [name], MD, 200 mg at 06/03/12 1437  Allergies:  Allergies  Allergen Reactions  . Ace Inhibitors Other (See Comments)    cough  . Codeine Nausea Only and Rash       . Penicillins Rash    Childhood allergy Has patient had a PCN reaction causing immediate  rash, facial/tongue/throat swelling, SOB or lightheadedness with hypotension: Yes Has patient had a PCN reaction causing severe rash involving mucus membranes or skin necrosis: Yes Has patient had a PCN reaction that required hospitalization No Has patient had a PCN reaction occurring within the last 10 years: No If all of the above answers are "NO", then may proceed with Cephalosporin use.     Past Medical History, Surgical history, Social history, and Family History were reviewed and updated.  Review of Systems: Review of Systems  HENT: Positive for sinus pain and sore throat.   Eyes: Positive for blurred vision.  Respiratory: Positive for wheezing.   Cardiovascular: Positive for leg swelling.  Gastrointestinal: Negative.   Genitourinary: Negative.   Musculoskeletal: Positive for back pain.  Skin: Negative.   Neurological: Positive for weakness.  Endo/Heme/Allergies: Negative.   Psychiatric/Behavioral: Negative.     Physical Exam:  weight is 234 lb 12 oz (106.5 kg). His oral temperature is 97.8 F (36.6 C). His blood pressure is 107/53 (abnormal) and his pulse is 80. His respiration is 18 and oxygen saturation is 98%.   Wt Readings from Last 3 Encounters:  02/26/17 234 lb 12 oz (106.5 kg)  02/17/17 223 lb 1.6 oz (101.2 kg)  02/05/17 237 lb (107.5 kg)      Physical Exam    Constitutional: He is oriented to person, place, and time.  HENT:  Head: Normocephalic and atraumatic.  Mouth/Throat: Oropharynx is clear and moist.  Eyes: EOM are normal. Pupils are equal, round, and reactive to light.  Neck: Normal range of motion.  Cardiovascular: Normal rate, regular rhythm and normal heart sounds.  Pulmonary/Chest: Effort normal and breath sounds normal.  Abdominal: Soft. Bowel sounds are normal.  Musculoskeletal: Normal range of motion. He exhibits no edema, tenderness or deformity.  Lymphadenopathy:    He has no cervical adenopathy.  Neurological: He is alert and oriented to person, place, and time.  Skin: Skin is warm and dry. No rash noted. No erythema.  Psychiatric: He has a normal mood and affect. His behavior is normal. Judgment and thought content normal.  Vitals reviewed.    Lab Results  Component Value Date   WBC 3.5 (L) 02/26/2017   HGB 10.5 (L) 01/10/2017   HCT 30.2 (L) 02/26/2017   MCV 94.4 02/26/2017   PLT 82 (L) 02/26/2017     Chemistry      Component Value Date/Time   NA 139 02/17/2017 1405   NA 141 01/10/2017 1136   NA 137 12/14/2015 1100   K 4.1 02/17/2017 1405   K 4.4 01/10/2017 1136   K 3.7 12/14/2015 1100   CL 106 02/17/2017 1405   CL 104 01/10/2017 1136   CO2 25 02/17/2017 1405   CO2 27 01/10/2017 1136   CO2 24 12/14/2015 1100   BUN 11 02/17/2017 1405   BUN 19 01/10/2017 1136   BUN 18.4 12/14/2015 1100   CREATININE 0.90 02/17/2017 1405   CREATININE 1.1 01/10/2017 1136   CREATININE 0.9 12/14/2015 1100      Component Value Date/Time   CALCIUM 8.9 02/17/2017 1405   CALCIUM 9.4 01/10/2017 1136   CALCIUM 9.5 12/14/2015 1100   ALKPHOS 110 (H) 02/17/2017 1405   ALKPHOS 112 (H) 01/10/2017 1136   ALKPHOS 102 12/14/2015 1100   AST 20 02/17/2017 1405   AST 22 12/14/2015 1100   ALT 7 02/17/2017 1405   ALT 18 01/10/2017 1136   ALT 10 12/14/2015 1100   BILITOT 0.5 02/17/2017 1405  BILITOT 0.58 12/14/2015 1100       Impression and Plan: Mr. Klosinski is an 84 year old white male. He has relapsed large cell non-Hodgkin lymphoma of the testicle. He was on salvage chemotherapy with Rituxan/bendamustine/Velcade. Again, he is not a candidate for aggressive intervention. He definitely is not a candidate for stem cell transplantation.  I really am not surprised that he responded to the R-ICE protocol.  Patients have done well with this.  We will try to pursue 2 additional cycles of treatment.  I think 4 cycles would definitely be all that he could handle.  We always had to have him come back about a week or so after treatment monitor his blood counts.  He does get Neulasta.  I spent about 30 minutes with he and his family.  Over 50% of time was spent face-to-face with them.  I reviewed his PET scan results.  I went over his lab work.  I went over my recommendations for additional therapy and follow-up after completion of therapy.  Answered their questions.  We will plan to get him back in 1 month.  I think one month would be reasonable so that his blood counts are a little bit better.  Volanda Napoleon, MD 2/20/20198:24 AM

## 2017-02-26 NOTE — Progress Notes (Signed)
Ok to treat with platelets 82 per Dr. Marin Olp.

## 2017-02-26 NOTE — Patient Instructions (Signed)
Implanted Port Home Guide An implanted port is a type of central line that is placed under the skin. Central lines are used to provide IV access when treatment or nutrition needs to be given through a person's veins. Implanted ports are used for long-term IV access. An implanted port may be placed because:  You need IV medicine that would be irritating to the small veins in your hands or arms.  You need long-term IV medicines, such as antibiotics.  You need IV nutrition for a long period.  You need frequent blood draws for lab tests.  You need dialysis.  Implanted ports are usually placed in the chest area, but they can also be placed in the upper arm, the abdomen, or the leg. An implanted port has two main parts:  Reservoir. The reservoir is round and will appear as a small, raised area under your skin. The reservoir is the part where a needle is inserted to give medicines or draw blood.  Catheter. The catheter is a thin, flexible tube that extends from the reservoir. The catheter is placed into a large vein. Medicine that is inserted into the reservoir goes into the catheter and then into the vein.  How will I care for my incision site? Do not get the incision site wet. Bathe or shower as directed by your health care provider. How is my port accessed? Special steps must be taken to access the port:  Before the port is accessed, a numbing cream can be placed on the skin. This helps numb the skin over the port site.  Your health care provider uses a sterile technique to access the port. ? Your health care provider must put on a mask and sterile gloves. ? The skin over your port is cleaned carefully with an antiseptic and allowed to dry. ? The port is gently pinched between sterile gloves, and a needle is inserted into the port.  Only "non-coring" port needles should be used to access the port. Once the port is accessed, a blood return should be checked. This helps ensure that the port  is in the vein and is not clogged.  If your port needs to remain accessed for a constant infusion, a clear (transparent) bandage will be placed over the needle site. The bandage and needle will need to be changed every week, or as directed by your health care provider.  Keep the bandage covering the needle clean and dry. Do not get it wet. Follow your health care provider's instructions on how to take a shower or bath while the port is accessed.  If your port does not need to stay accessed, no bandage is needed over the port.  What is flushing? Flushing helps keep the port from getting clogged. Follow your health care provider's instructions on how and when to flush the port. Ports are usually flushed with saline solution or a medicine called heparin. The need for flushing will depend on how the port is used.  If the port is used for intermittent medicines or blood draws, the port will need to be flushed: ? After medicines have been given. ? After blood has been drawn. ? As part of routine maintenance.  If a constant infusion is running, the port may not need to be flushed.  How long will my port stay implanted? The port can stay in for as long as your health care provider thinks it is needed. When it is time for the port to come out, surgery will be   done to remove it. The procedure is similar to the one performed when the port was put in. When should I seek immediate medical care? When you have an implanted port, you should seek immediate medical care if:  You notice a bad smell coming from the incision site.  You have swelling, redness, or drainage at the incision site.  You have more swelling or pain at the port site or the surrounding area.  You have a fever that is not controlled with medicine.  This information is not intended to replace advice given to you by your health care provider. Make sure you discuss any questions you have with your health care provider. Document  Released: 12/24/2004 Document Revised: 06/01/2015 Document Reviewed: 08/31/2012 [employer] Interactive Patient Education  2017 [employer]  

## 2017-02-26 NOTE — Patient Instructions (Signed)
[facility] Discharge Instructions for Patients Receiving Chemotherapy  Today you received the following chemotherapy agents Rituxan/Ifex/[place]/VP 16 (Etoposide) To help prevent nausea and vomiting after your treatment, we encourage you to take your nausea medication as prescribed.   If you develop nausea and vomiting that is not controlled by your nausea medication, call the clinic.   BELOW ARE SYMPTOMS THAT SHOULD BE REPORTED IMMEDIATELY:  *FEVER GREATER THAN 100.5 F  *CHILLS WITH OR WITHOUT FEVER  NAUSEA AND VOMITING THAT IS NOT CONTROLLED WITH YOUR NAUSEA MEDICATION  *UNUSUAL SHORTNESS OF BREATH  *UNUSUAL BRUISING OR BLEEDING  TENDERNESS IN MOUTH AND THROAT WITH OR WITHOUT PRESENCE OF ULCERS  *URINARY PROBLEMS  *BOWEL PROBLEMS  UNUSUAL RASH Items with * indicate a potential emergency and should be followed up as soon as possible.  Feel free to call the clinic should you have any questions or concerns. The clinic phone number is [phone].  Please show the [facility] at check-in to the Emergency Department and triage nurse.

## 2017-02-27 ENCOUNTER — Other Ambulatory Visit: Payer: Self-pay | Admitting: Pharmacist

## 2017-02-27 ENCOUNTER — Inpatient Hospital Stay: Payer: Medicare Other

## 2017-02-27 VITALS — BP 122/60 | HR 74 | Temp 97.5°F | Resp 16

## 2017-02-27 DIAGNOSIS — C44300 Unspecified malignant neoplasm of skin of unspecified part of face: Secondary | ICD-10-CM

## 2017-02-27 DIAGNOSIS — C8589 Other specified types of non-Hodgkin lymphoma, extranodal and solid organ sites: Secondary | ICD-10-CM | POA: Diagnosis not present

## 2017-02-27 MED ORDER — SODIUM CHLORIDE 0.9 % IV SOLN
72.0000 mg/m2 | Freq: Once | INTRAVENOUS | Status: AC
  Administered 2017-02-27: 160 mg via INTRAVENOUS
  Filled 2017-02-27: qty 8

## 2017-02-27 MED ORDER — SODIUM CHLORIDE 0.9 % IV SOLN
Freq: Once | INTRAVENOUS | Status: AC
  Administered 2017-02-27: 13:00:00 via INTRAVENOUS
  Filled 2017-02-27: qty 46

## 2017-02-27 MED ORDER — SODIUM CHLORIDE 0.9 % IV SOLN
Freq: Once | INTRAVENOUS | Status: AC
  Administered 2017-02-27: 09:00:00 via INTRAVENOUS

## 2017-02-27 MED ORDER — SODIUM CHLORIDE 0.9 % IV SOLN
356.0000 mg | Freq: Once | INTRAVENOUS | Status: AC
  Administered 2017-02-27: 360 mg via INTRAVENOUS
  Filled 2017-02-27: qty 36

## 2017-02-27 MED ORDER — PEGFILGRASTIM 6 MG/0.6ML ~~LOC~~ PSKT
PREFILLED_SYRINGE | SUBCUTANEOUS | Status: AC
  Filled 2017-02-27: qty 0.6

## 2017-02-27 MED ORDER — PEGFILGRASTIM 6 MG/0.6ML ~~LOC~~ PSKT
6.0000 mg | PREFILLED_SYRINGE | Freq: Once | SUBCUTANEOUS | Status: DC
  Filled 2017-02-27: qty 0.6

## 2017-02-27 MED ORDER — MESNA 100 MG/ML IV SOLN
400.0000 mg/m2 | Freq: Once | INTRAVENOUS | Status: AC
  Administered 2017-02-27: 900 mg via INTRAVENOUS
  Filled 2017-02-27: qty 9

## 2017-02-27 MED ORDER — PEGFILGRASTIM 6 MG/0.6ML ~~LOC~~ PSKT
6.0000 mg | PREFILLED_SYRINGE | Freq: Once | SUBCUTANEOUS | Status: AC
  Administered 2017-02-27: 6 mg via SUBCUTANEOUS

## 2017-02-27 MED ORDER — DEXAMETHASONE SODIUM PHOSPHATE 10 MG/ML IJ SOLN
10.0000 mg | Freq: Once | INTRAMUSCULAR | Status: AC
  Administered 2017-02-27: 10 mg via INTRAVENOUS

## 2017-02-27 MED ORDER — SODIUM CHLORIDE 0.9% FLUSH
10.0000 mL | INTRAVENOUS | Status: DC | PRN
  Administered 2017-02-27: 10 mL
  Filled 2017-02-27: qty 10

## 2017-02-27 MED ORDER — HEPARIN SOD (PORK) LOCK FLUSH 100 UNIT/ML IV SOLN
500.0000 [IU] | Freq: Once | INTRAVENOUS | Status: AC | PRN
  Administered 2017-02-27: 500 [IU]
  Filled 2017-02-27: qty 5

## 2017-02-27 MED ORDER — DEXAMETHASONE SODIUM PHOSPHATE 10 MG/ML IJ SOLN
INTRAMUSCULAR | Status: AC
  Filled 2017-02-27: qty 1

## 2017-02-27 NOTE — Addendum Note (Signed)
Addended by: Randolm Idol on: 02/27/2017 09:28 AM   Modules accepted: Orders

## 2017-02-27 NOTE — Addendum Note (Signed)
Addended by: Margaret Pyle on: 02/27/2017 03:35 PM   Modules accepted: Orders

## 2017-02-27 NOTE — Patient Instructions (Signed)
Carboplatin injection What is this medicine? CARBOPLATIN (KAR boe pla tin) is a chemotherapy drug. It targets fast dividing cells, like cancer cells, and causes these cells to die. This medicine is used to treat ovarian cancer and many other cancers. This medicine may be used for other purposes; ask your health care provider or pharmacist if you have questions. COMMON BRAND NAME(S): Paraplatin What should I tell my health care provider before I take this medicine? They need to know if you have any of these conditions: -blood disorders -hearing problems -kidney disease -recent or ongoing radiation therapy -an unusual or allergic reaction to carboplatin, cisplatin, other chemotherapy, other medicines, foods, dyes, or preservatives -pregnant or trying to get pregnant -breast-feeding How should I use this medicine? This drug is usually given as an infusion into a vein. It is administered in a hospital or clinic by a specially trained health care professional. Talk to your pediatrician regarding the use of this medicine in children. Special care may be needed. Overdosage: If you think you have taken too much of this medicine contact a poison control center or emergency room at once. NOTE: This medicine is only for you. Do not share this medicine with others. What if I miss a dose? It is important not to miss a dose. Call your doctor or health care professional if you are unable to keep an appointment. What may interact with this medicine? -medicines for seizures -medicines to increase blood counts like filgrastim, pegfilgrastim, sargramostim -some antibiotics like amikacin, gentamicin, neomycin, streptomycin, tobramycin -vaccines Talk to your doctor or health care professional before taking any of these medicines: -acetaminophen -aspirin -ibuprofen -ketoprofen -naproxen This list may not describe all possible interactions. Give your health care provider a list of all the medicines, herbs,  non-prescription drugs, or dietary supplements you use. Also tell them if you smoke, drink alcohol, or use illegal drugs. Some items may interact with your medicine. What should I watch for while using this medicine? Your condition will be monitored carefully while you are receiving this medicine. You will need important blood work done while you are taking this medicine. This drug may make you feel generally unwell. This is not uncommon, as chemotherapy can affect healthy cells as well as cancer cells. Report any side effects. Continue your course of treatment even though you feel ill unless your doctor tells you to stop. In some cases, you may be given additional medicines to help with side effects. Follow all directions for their use. Call your doctor or health care professional for advice if you get a fever, chills or sore throat, or other symptoms of a cold or flu. Do not treat yourself. This drug decreases your body's ability to fight infections. Try to avoid being around people who are sick. This medicine may increase your risk to bruise or bleed. Call your doctor or health care professional if you notice any unusual bleeding. Be careful brushing and flossing your teeth or using a toothpick because you may get an infection or bleed more easily. If you have any dental work done, tell your dentist you are receiving this medicine. Avoid taking products that contain aspirin, acetaminophen, ibuprofen, naproxen, or ketoprofen unless instructed by your doctor. These medicines may hide a fever. Do not become pregnant while taking this medicine. Women should inform their doctor if they wish to become pregnant or think they might be pregnant. There is a potential for serious side effects to an unborn child. Talk to your health care professional or  pharmacist for more information. Do not breast-feed an infant while taking this medicine. What side effects may I notice from receiving this medicine? Side effects  that you should report to your doctor or health care professional as soon as possible: -allergic reactions like skin rash, itching or hives, swelling of the face, lips, or tongue -signs of infection - fever or chills, cough, sore throat, pain or difficulty passing urine -signs of decreased platelets or bleeding - bruising, pinpoint red spots on the skin, black, tarry stools, nosebleeds -signs of decreased red blood cells - unusually weak or tired, fainting spells, lightheadedness -breathing problems -changes in hearing -changes in vision -chest pain -high blood pressure -low blood counts - This drug may decrease the number of white blood cells, red blood cells and platelets. You may be at increased risk for infections and bleeding. -nausea and vomiting -pain, swelling, redness or irritation at the injection site -pain, tingling, numbness in the hands or feet -problems with balance, talking, walking -trouble passing urine or change in the amount of urine Side effects that usually do not require medical attention (report to your doctor or health care professional if they continue or are bothersome): -hair loss -loss of appetite -metallic taste in the mouth or changes in taste This list may not describe all possible side effects. Call your doctor for medical advice about side effects. You may report side effects to FDA at 1-800-FDA-1088. Where should I keep my medicine? This drug is given in a hospital or clinic and will not be stored at home. NOTE: This sheet is a summary. It may not cover all possible information. If you have questions about this medicine, talk to your doctor, pharmacist, or health care provider.  2018 [employer] Standard (2007-03-31 14:38:05) Ifosfamide injection What is this medicine? IFOSFAMIDE (eye FOS fa mide) is a chemotherapy drug. It slows the growth of cancer cells. This medicine is used to treat testicular cancer. This medicine may be used for other purposes;  ask your health care provider or pharmacist if you have questions. COMMON BRAND NAME(S): Ifex, Ifex/Mesna What should I tell my health care provider before I take this medicine? They need to know if you have any of these conditions: -bladder problems -blood disorders -dehydration -infection (especially a virus infection such as chickenpox, cold sores, or herpes) -kidney disease -liver disease -recent or ongoing radiation therapy -an unusual or allergic reaction to ifosfamide, other chemotherapy, other medicines, foods, dyes, or preservatives -pregnant or trying to get pregnant -breast-feeding How should I use this medicine? This drug is given as an infusion into a vein. It is administered in a hospital or clinic by a specially trained health care professional. Talk to your pediatrician regarding the use of this medicine in children. Special care may be needed. Overdosage: If you think you have taken too much of this medicine contact a poison control center or emergency room at once. NOTE: This medicine is only for you. Do not share this medicine with others. What if I miss a dose? It is important not to miss your dose. Call your doctor or health care professional if you are unable to keep an appointment. What may interact with this medicine? Do not take this medicine with any of the following medications: -nalidixic acid This medicine may also interact with the following medications: -medicines to increase blood counts like filgrastim, pegfilgrastim, sargramostim -St. John's Wort -vaccines Talk to your doctor or health care professional before taking any of these medicines: -acetaminophen -aspirin -ibuprofen -ketoprofen -  naproxen This list may not describe all possible interactions. Give your health care provider a list of all the medicines, herbs, non-prescription drugs, or dietary supplements you use. Also tell them if you smoke, drink alcohol, or use illegal drugs. Some items may  interact with your medicine. What should I watch for while using this medicine? Visit your doctor for checks on your progress. This drug may make you feel generally unwell. This is not uncommon, as chemotherapy can affect healthy cells as well as cancer cells. Report any side effects. Continue your course of treatment even though you feel ill unless your doctor tells you to stop. Drink water or other fluids as directed. Urinate often, even at night. In some cases, you may be given additional medicines to help with side effects. Follow all directions for their use. Call your doctor or health care professional for advice if you get a fever, chills or sore throat, or other symptoms of a cold or flu. Do not treat yourself. This drug decreases your body's ability to fight infections. Try to avoid being around people who are sick. This medicine may increase your risk to bruise or bleed. Call your doctor or health care professional if you notice any unusual bleeding. Be careful brushing and flossing your teeth or using a toothpick because you may get an infection or bleed more easily. If you have any dental work done, tell your dentist you are receiving this medicine. Avoid taking products that contain aspirin, acetaminophen, ibuprofen, naproxen, or ketoprofen unless instructed by your doctor. These medicines may hide a fever. Do not become pregnant while taking this medicine. Women should inform their doctor if they wish to become pregnant or think they might be pregnant. There is a potential for serious side effects to an unborn child. Talk to your health care professional or pharmacist for more information. Do not breast-feed an infant while taking this medicine. What side effects may I notice from receiving this medicine? Side effects that you should report to your doctor or health care professional as soon as possible: -allergic reactions like skin rash, itching or hives, swelling of the face, lips, or  tongue -low blood counts - this medicine may decrease the number of white blood cells, red blood cells and platelets. You may be at increased risk for infections and bleeding. -signs of infection - fever or chills, cough, sore throat, pain or difficulty passing urine -signs of decreased platelets or bleeding - bruising, pinpoint red spots on the skin, black, tarry stools, blood in the urine -signs of decreased red blood cells - unusually weak or tired, fainting spells, lightheadedness -agitation -breathing problems -confusion -dark urine -hallucinations -mouth sores -pain, swelling, redness at site where injected -seizures -trouble passing urine or change in the amount of urine -yellowing of the eyes or skin Side effects that usually do not require medical attention (report to your doctor or health care professional if they continue or are bothersome): -diarrhea -hair loss -loss of appetite -nausea, vomiting This list may not describe all possible side effects. Call your doctor for medical advice about side effects. You may report side effects to FDA at 1-800-FDA-1088. Where should I keep my medicine? This drug is given in a hospital or clinic and will not be stored at home. NOTE: This sheet is a summary. It may not cover all possible information. If you have questions about this medicine, talk to your doctor, pharmacist, or health care provider.  2018 [employer] Standard (2007-05-12 16:23:05) Mesna injection What  is this medicine? MESNA (MES na) is used to prevent bleeding from the bladder during treatment with ifosfamide. This medicine does not reduce the chance of getting other side effects of cancer chemotherapy. This medicine may be used for other purposes; ask your health care provider or pharmacist if you have questions. COMMON BRAND NAME(S): Mesnex What should I tell my health care provider before I take this medicine? They need to know if you have any of these  conditions: -autoimmune disease like lupus, nephritis, or rheumatoid arthritis -an unusual or allergic reaction to mesna, benzyl alcohol, sulfur medicines, other medicines, foods, dyes, or preservatives -pregnant or trying to get pregnant -breast-feeding How should I use this medicine? This medicine is for infusion into a vein. It is given by a health care professional in a hospital or clinic setting. Talk to your pediatrician regarding the use of this medicine in children. Special care may be needed. Overdosage: If you think you have taken too much of this medicine contact a poison control center or emergency room at once. NOTE: This medicine is only for you. Do not share this medicine with others. What if I miss a dose? This does not apply. What may interact with this medicine? Interactions are not expected. This list may not describe all possible interactions. Give your health care provider a list of all the medicines, herbs, non-prescription drugs, or dietary supplements you use. Also tell them if you smoke, drink alcohol, or use illegal drugs. Some items may interact with your medicine. What should I watch for while using this medicine? Your doctor will follow your condition closely while you are taking this medicine. Tell your doctor right away if you see that your urine has turned a pink or red color. It is important to drink at least a quart (4 cups) of fluids each day that you take this medicine. What side effects may I notice from receiving this medicine? Side effects that you should report to your doctor or health care professional as soon as possible: -allergic reactions like skin rash, itching or hives, swelling of the face, lips, or tongue -breathing problems -blood in your urine or pink to red colored urine -fever, chills, or sore throat -flushing or redness to skin -mouth sores -pain or redness at site where injected -swelling of ankles or feet -vomiting Side effects that  usually do not require medical attention (report to your doctor or health care professional if they continue or are bothersome): -aches and pains -bad taste in mouth -diarrhea -dizziness -hair loss -headache -nausea This list may not describe all possible side effects. Call your doctor for medical advice about side effects. You may report side effects to FDA at 1-800-FDA-1088. Where should I keep my medicine? This drug is given in a hospital or clinic and will not be stored at home. NOTE: This sheet is a summary. It may not cover all possible information. If you have questions about this medicine, talk to your doctor, pharmacist, or health care provider.  2018 [employer] Standard (2007-07-13 16:43:56) Etoposide, VP-16 injection What is this medicine? ETOPOSIDE, VP-16 (e toe POE side) is a chemotherapy drug. It is used to treat testicular cancer, lung cancer, and other cancers. This medicine may be used for other purposes; ask your health care provider or pharmacist if you have questions. COMMON BRAND NAME(S): Etopophos, Toposar, VePesid What should I tell my health care provider before I take this medicine? They need to know if you have any of these conditions: -infection -kidney  disease -liver disease -low blood counts, like low white cell, platelet, or red cell counts -an unusual or allergic reaction to etoposide, other medicines, foods, dyes, or preservatives -pregnant or trying to get pregnant -breast-feeding How should I use this medicine? This medicine is for infusion into a vein. It is administered in a hospital or clinic by a specially trained health care professional. Talk to your pediatrician regarding the use of this medicine in children. Special care may be needed. Overdosage: If you think you have taken too much of this medicine contact a poison control center or emergency room at once. NOTE: This medicine is only for you. Do not share this medicine with others. What  if I miss a dose? It is important not to miss your dose. Call your doctor or health care professional if you are unable to keep an appointment. What may interact with this medicine? -aspirin -certain medications for seizures like carbamazepine, phenobarbital, phenytoin, valproic acid -cyclosporine -levamisole -warfarin This list may not describe all possible interactions. Give your health care provider a list of all the medicines, herbs, non-prescription drugs, or dietary supplements you use. Also tell them if you smoke, drink alcohol, or use illegal drugs. Some items may interact with your medicine. What should I watch for while using this medicine? Visit your doctor for checks on your progress. This drug may make you feel generally unwell. This is not uncommon, as chemotherapy can affect healthy cells as well as cancer cells. Report any side effects. Continue your course of treatment even though you feel ill unless your doctor tells you to stop. In some cases, you may be given additional medicines to help with side effects. Follow all directions for their use. Call your doctor or health care professional for advice if you get a fever, chills or sore throat, or other symptoms of a cold or flu. Do not treat yourself. This drug decreases your body's ability to fight infections. Try to avoid being around people who are sick. This medicine may increase your risk to bruise or bleed. Call your doctor or health care professional if you notice any unusual bleeding. Talk to your doctor about your risk of cancer. You may be more at risk for certain types of cancers if you take this medicine. Do not become pregnant while taking this medicine or for at least 6 months after stopping it. Women should inform their doctor if they wish to become pregnant or think they might be pregnant. Women of child-bearing potential will need to have a negative pregnancy test before starting this medicine. There is a potential for  serious side effects to an unborn child. Talk to your health care professional or pharmacist for more information. Do not breast-feed an infant while taking this medicine. Men must use a latex condom during sexual contact with a woman while taking this medicine and for at least 4 months after stopping it. A latex condom is needed even if you have had a vasectomy. Contact your doctor right away if your partner becomes pregnant. Do not donate sperm while taking this medicine and for at least 4 months after you stop taking this medicine. Men should inform their doctors if they wish to father a child. This medicine may lower sperm counts. What side effects may I notice from receiving this medicine? Side effects that you should report to your doctor or health care professional as soon as possible: -allergic reactions like skin rash, itching or hives, swelling of the face, lips, or tongue -  low blood counts - this medicine may decrease the number of white blood cells, red blood cells and platelets. You may be at increased risk for infections and bleeding. -signs of infection - fever or chills, cough, sore throat, pain or difficulty passing urine -signs of decreased platelets or bleeding - bruising, pinpoint red spots on the skin, black, tarry stools, blood in the urine -signs of decreased red blood cells - unusually weak or tired, fainting spells, lightheadedness -breathing problems -changes in vision -mouth or throat sores or ulcers -pain, redness, swelling or irritation at the injection site -pain, tingling, numbness in the hands or feet -redness, blistering, peeling or loosening of the skin, including inside the mouth -seizures -vomiting Side effects that usually do not require medical attention (report to your doctor or health care professional if they continue or are bothersome): -diarrhea -hair loss -loss of appetite -nausea -stomach pain This list may not describe all possible side effects.  Call your doctor for medical advice about side effects. You may report side effects to FDA at 1-800-FDA-1088. Where should I keep my medicine? This drug is given in a hospital or clinic and will not be stored at home. NOTE: This sheet is a summary. It may not cover all possible information. If you have questions about this medicine, talk to your doctor, pharmacist, or health care provider.  2018 [employer] Standard (2014-12-16 11:53:23)

## 2017-02-28 ENCOUNTER — Ambulatory Visit: Payer: Medicare Other

## 2017-03-03 ENCOUNTER — Ambulatory Visit: Payer: Medicare Other

## 2017-03-06 ENCOUNTER — Inpatient Hospital Stay: Payer: Medicare Other

## 2017-03-06 ENCOUNTER — Encounter: Payer: Self-pay | Admitting: Hematology & Oncology

## 2017-03-06 ENCOUNTER — Other Ambulatory Visit: Payer: Self-pay

## 2017-03-06 ENCOUNTER — Inpatient Hospital Stay (HOSPITAL_BASED_OUTPATIENT_CLINIC_OR_DEPARTMENT_OTHER): Payer: Medicare Other | Admitting: Hematology & Oncology

## 2017-03-06 VITALS — BP 104/57 | HR 83 | Temp 98.5°F | Resp 19 | Wt 240.0 lb

## 2017-03-06 DIAGNOSIS — R5383 Other fatigue: Secondary | ICD-10-CM | POA: Diagnosis not present

## 2017-03-06 DIAGNOSIS — C8589 Other specified types of non-Hodgkin lymphoma, extranodal and solid organ sites: Secondary | ICD-10-CM | POA: Diagnosis not present

## 2017-03-06 DIAGNOSIS — R5381 Other malaise: Secondary | ICD-10-CM

## 2017-03-06 DIAGNOSIS — H538 Other visual disturbances: Secondary | ICD-10-CM | POA: Diagnosis not present

## 2017-03-06 DIAGNOSIS — R600 Localized edema: Secondary | ICD-10-CM

## 2017-03-06 DIAGNOSIS — Z880 Allergy status to penicillin: Secondary | ICD-10-CM

## 2017-03-06 DIAGNOSIS — J3489 Other specified disorders of nose and nasal sinuses: Secondary | ICD-10-CM

## 2017-03-06 DIAGNOSIS — Z7984 Long term (current) use of oral hypoglycemic drugs: Secondary | ICD-10-CM

## 2017-03-06 DIAGNOSIS — Z79899 Other long term (current) drug therapy: Secondary | ICD-10-CM

## 2017-03-06 DIAGNOSIS — Z7901 Long term (current) use of anticoagulants: Secondary | ICD-10-CM | POA: Diagnosis not present

## 2017-03-06 DIAGNOSIS — G629 Polyneuropathy, unspecified: Secondary | ICD-10-CM | POA: Diagnosis not present

## 2017-03-06 DIAGNOSIS — M549 Dorsalgia, unspecified: Secondary | ICD-10-CM

## 2017-03-06 DIAGNOSIS — Z923 Personal history of irradiation: Secondary | ICD-10-CM

## 2017-03-06 LAB — CBC WITH DIFFERENTIAL (CANCER CENTER ONLY)
Basophils Absolute: 0 10*3/uL (ref 0.0–0.1)
Basophils Relative: 1 %
Eosinophils Absolute: 0 10*3/uL (ref 0.0–0.5)
Eosinophils Relative: 1 %
HCT: 26.7 % — ABNORMAL LOW (ref 38.7–49.9)
HEMOGLOBIN: 8.9 g/dL — AB (ref 13.0–17.1)
LYMPHS ABS: 0.3 10*3/uL — AB (ref 0.9–3.3)
Lymphocytes Relative: 10 %
MCH: 31.7 pg (ref 28.0–33.4)
MCHC: 33.3 g/dL (ref 32.0–35.9)
MCV: 95 fL (ref 82.0–98.0)
MONOS PCT: 22 %
Monocytes Absolute: 0.8 10*3/uL (ref 0.1–0.9)
NEUTROS PCT: 66 %
Neutro Abs: 2.3 10*3/uL (ref 1.5–6.5)
Platelet Count: 62 10*3/uL — ABNORMAL LOW (ref 145–400)
RBC: 2.81 MIL/uL — AB (ref 4.20–5.70)
RDW: 15.4 % (ref 11.1–15.7)
WBC: 3.4 10*3/uL — AB (ref 4.0–10.0)

## 2017-03-06 LAB — CMP (CANCER CENTER ONLY)
ALT: 15 U/L (ref 10–47)
ANION GAP: 7 (ref 5–15)
AST: 19 U/L (ref 11–38)
Albumin: 3.2 g/dL — ABNORMAL LOW (ref 3.5–5.0)
Alkaline Phosphatase: 113 U/L — ABNORMAL HIGH (ref 26–84)
BUN: 17 mg/dL (ref 7–22)
CO2: 26 mmol/L (ref 18–33)
Calcium: 9 mg/dL (ref 8.0–10.3)
Chloride: 105 mmol/L (ref 98–108)
Creatinine: 1.1 mg/dL (ref 0.60–1.20)
GLUCOSE: 171 mg/dL — AB (ref 73–118)
POTASSIUM: 3.9 mmol/L (ref 3.3–4.7)
SODIUM: 138 mmol/L (ref 128–145)
Total Bilirubin: 0.6 mg/dL (ref 0.2–1.6)
Total Protein: 6.1 g/dL — ABNORMAL LOW (ref 6.4–8.1)

## 2017-03-06 LAB — SAMPLE TO BLOOD BANK

## 2017-03-06 LAB — PROTIME-INR
INR: 1.37
Prothrombin Time: 16.8 seconds — ABNORMAL HIGH (ref 11.4–15.2)

## 2017-03-06 MED ORDER — HEPARIN SOD (PORK) LOCK FLUSH 100 UNIT/ML IV SOLN
500.0000 [IU] | Freq: Once | INTRAVENOUS | Status: AC
  Administered 2017-03-06: 500 [IU] via INTRAVENOUS
  Filled 2017-03-06: qty 5

## 2017-03-06 MED ORDER — SODIUM CHLORIDE 0.9% FLUSH
10.0000 mL | INTRAVENOUS | Status: DC | PRN
  Administered 2017-03-06: 10 mL via INTRAVENOUS
  Filled 2017-03-06: qty 10

## 2017-03-06 MED ORDER — CIPROFLOXACIN HCL 500 MG PO TABS: 500.0000 mg | ORAL_TABLET | Freq: Two times a day (BID) | ORAL | 1 refills | Status: DC

## 2017-03-06 NOTE — Progress Notes (Signed)
Hematology and Oncology Follow Up Visit  Gary Yates [id number] 17-May-1933 84 y.o. 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  He had his chemotherapy last week.  Want to make sure that his blood counts and labs do not look too bad.  Thankfully, they do not look all that bad.  He feels okay.  He just feels tired.  I told him that this is likely from the chemotherapy.  Previous one more cycle of chemotherapy left.  His last PET scan showed resolution of his lymphoma.  He does have a lot of other health issues that we have to watch out for.  I think he does need to be on ciprofloxacin.  I reordered this for him.  His appetite is doing okay.  He has had no nausea or vomiting.  He has had no diarrhea.  Overall, his performance status is ECOG 2.  Medications:  Current Outpatient Medications:  .  ACCU-CHEK AVIVA PLUS test strip, TEST 3 TIMES A DAY (Patient taking differently: TEST 2 TIMES A DAY), Disp: 300 each, Rfl: 5 .  acetaminophen (TYLENOL) 500 MG tablet, Take 1,000 mg by mouth every 6 (six) hours as needed (for pain/fever/headaches.). , Disp: , Rfl:  .  atorvastatin (LIPITOR) 40 MG tablet, TAKE 1 TABLET BY MOUTH DAILY, Disp: 90 tablet, Rfl: 2 .  ciprofloxacin (CIPRO) 500 MG tablet, Take 1 tablet (500 mg total) by mouth 2 (two) times daily., Disp: 14 tablet, Rfl: 1 .  clopidogrel (PLAVIX) 75 MG tablet, TAKE 1 TABLET BY MOUTH DAILY, Disp: 30 tablet, Rfl: 5 .  dexamethasone (DECADRON) 4 MG tablet, Take 2 tablets one time a day for 2 days starting the day after chemotherapy. Take with food., Disp: 30 tablet, Rfl: 1 .  docusate sodium  (COLACE) 50 MG capsule, Take 1 capsule (50 mg total) by mouth 2 (two) times daily as needed (for constipation)., Disp: 90 capsule, Rfl: 0 .  furosemide (LASIX) 40 MG tablet, Take 1 tablet (40 mg total) by mouth 2 (two) times daily., Disp: 60 tablet, Rfl: 0 .  gabapentin (NEURONTIN) 300 MG capsule, Take 2 capsules (600 mg total) by mouth 3 (three) times daily., Disp: 540 capsule, Rfl: 2 .  KLOR-CON M20 20 MEQ tablet, TAKE 1 TABLET BY MOUTH EVERY DAY, Disp: 90 tablet, Rfl: 0 .  lactulose (CHRONULAC) 10 GM/15ML solution, Take 15 mLs (10 g total) by mouth every 6 (six) hours as needed for mild constipation or moderate constipation., Disp: 240 mL, Rfl: 0 .  lidocaine (XYLOCAINE) 2 % solution, , Disp: , Rfl:  .  magic mouthwash w/lidocaine SOLN, Take 5 mLs by mouth 4 (four) times daily as needed for mouth pain., Disp: 600 mL, Rfl: 3 .  metFORMIN (GLUCOPHAGE) 500 MG tablet, TAKE 1 TABLET BY MOUTH TWICE A DAY WITH A MEAL, Disp: 180 tablet, Rfl: 2 .  metoprolol succinate (TOPROL-XL) 100 MG 24 hr tablet, TAKE 1 TABLET BY MOUTH EVERY DAY IMMEDIATELY FOLLOWING A MEAL, Disp: 90 tablet, Rfl: 1 .  mucosal barrier oral (GELCLAIR) GEL, Take 1 packet by mouth 3 (three) times daily as needed., Disp: 60  packet, Rfl: 2 .  Multiple Vitamins-Minerals (CENTRUM SILVER 50+MEN) TABS, Take 1 tablet by mouth daily., Disp: , Rfl:  .  Niacin (VITAMIN B-3 PO), Take by mouth daily., Disp: , Rfl:  .  nitroGLYCERIN (NITROSTAT) 0.4 MG SL tablet, Place 1 tablet (0.4 mg total) under the tongue every 5 (five) minutes as needed. Chest pain, Disp: 25 tablet, Rfl: 6 .  nystatin (MYCOSTATIN) 100000 UNIT/ML suspension, , Disp: , Rfl:  .  ondansetron (ZOFRAN) 4 MG tablet, Take 1 tablet (4 mg total) by mouth every 8 (eight) hours as needed for nausea or vomiting., Disp: 30 tablet, Rfl: 0 .  ondansetron (ZOFRAN) 8 MG tablet, Take 1 tablet (8 mg total) by mouth 2 (two) times daily as needed. Start on day 6 of chemotherapy., Disp: 30 tablet, Rfl:  1 .  oxybutynin (DITROPAN XL) 15 MG 24 hr tablet, Take 15 mg by mouth at bedtime. , Disp: , Rfl:  .  oxymetazoline (AFRIN) 0.05 % nasal spray, Place 1-2 sprays into both nostrils 2 (two) times daily as needed for congestion., Disp: , Rfl:  .  pantoprazole (PROTONIX) 40 MG tablet, TAKE 1 TABLET BY MOUTH EVERY DAY, Disp: 90 tablet, Rfl: 3 .  polyethylene glycol powder (MIRALAX) powder, Take 17 g by mouth 2 (two) times daily as needed., Disp: 255 g, Rfl: 0 .  pramipexole (MIRAPEX) 1.5 MG tablet, Take two tablets every night., Disp: 180 tablet, Rfl: 3 .  prochlorperazine (COMPAZINE) 10 MG tablet, Take 1 tablet (10 mg total) by mouth every 6 (six) hours as needed (Nausea or vomiting)., Disp: 30 tablet, Rfl: 1 .  tamsulosin (FLOMAX) 0.4 MG CAPS capsule, Take 0.4 mg by mouth at bedtime., Disp: , Rfl:  .  temazepam (RESTORIL) 30 MG capsule, TAKE ONE CAPSULE AT BEDTIME AS NEEDED SLEEP, Disp: 15 capsule, Rfl: 0 .  TOUJEO SOLOSTAR 300 UNIT/ML SOPN, INJECT 20 UNITS INTO THE SKIN DAILY AFTER BREAKFAST., Disp: 15 mL, Rfl: 1 .  Trospium Chloride 60 MG CP24, Take 60 mg by mouth daily., Disp: , Rfl:  .  vitamin B-12 (CYANOCOBALAMIN) 1000 MCG tablet, Take 1,000 mcg by mouth daily., Disp: , Rfl:  .  warfarin (COUMADIN) 5 MG tablet, Take as directed by anticoagulation clinic, Disp: 50 tablet, Rfl: 1 No current facility-administered medications for this visit.   Facility-Administered Medications Ordered in Other Visits:  .  acetaminophen (TYLENOL) tablet 650 mg, 650 mg, Oral, Once, [place], Sarah M, NP .  pegfilgrastim (NEULASTA ONPRO KIT) injection 6 mg, 6 mg, Subcutaneous, Once, Erbie Arment R, MD .  sodium chloride flush (NS) 0.9 % injection 10 mL, 10 mL, Intracatheter, PRN, Volanda Napoleon, MD, 10 mL at 02/27/17 1503 .  testosterone cypionate (DEPOTESTOTERONE CYPIONATE) injection 200 mg, 200 mg, Intramuscular, Q28 days, [name], Alinda Sierras, MD, 200 mg at 12/26/11 0850 .  testosterone cypionate  (DEPOTESTOTERONE CYPIONATE) injection 200 mg, 200 mg, Intramuscular, Q28 days, [name], Alinda Sierras, MD, 200 mg at 01/23/12 0908 .  testosterone cypionate (DEPOTESTOTERONE CYPIONATE) injection 200 mg, 200 mg, Intramuscular, Q28 days, [name], Alinda Sierras, MD, 200 mg at 02/25/12 0848 .  testosterone cypionate (DEPOTESTOTERONE CYPIONATE) injection 200 mg, 200 mg, Intramuscular, Q28 days, [name], MD, 200 mg at 06/03/12 1437  Allergies:  Allergies  Allergen Reactions  . Ace Inhibitors Other (See Comments)    cough  . Codeine Nausea Only and Rash       . Penicillins Rash    Childhood allergy Has patient had a PCN reaction causing  immediate rash, facial/tongue/throat swelling, SOB or lightheadedness with hypotension: Yes Has patient had a PCN reaction causing severe rash involving mucus membranes or skin necrosis: Yes Has patient had a PCN reaction that required hospitalization No Has patient had a PCN reaction occurring within the last 10 years: No If all of the above answers are "NO", then may proceed with Cephalosporin use.     Past Medical History, Surgical history, Social history, and Family History were reviewed and updated.  Review of Systems: Review of Systems  HENT: Positive for sinus pain and sore throat.   Eyes: Positive for blurred vision.  Respiratory: Positive for wheezing.   Cardiovascular: Positive for leg swelling.  Gastrointestinal: Negative.   Genitourinary: Negative.   Musculoskeletal: Positive for back pain.  Skin: Negative.   Neurological: Positive for weakness.  Endo/Heme/Allergies: Negative.   Psychiatric/Behavioral: Negative.     Physical Exam:  weight is 240 lb (108.9 kg). His oral temperature is 98.5 F (36.9 C). His blood pressure is 104/57 (abnormal) and his pulse is 83. His respiration is 19 and oxygen saturation is 99%.   Wt Readings from Last 3 Encounters:  03/06/17 240 lb (108.9 kg)  02/26/17 234 lb 12 oz (106.5 kg)  02/17/17 223 lb 1.6  oz (101.2 kg)      Physical Exam  Constitutional: He is oriented to person, place, and time.  HENT:  Head: Normocephalic and atraumatic.  Mouth/Throat: Oropharynx is clear and moist.  Eyes: EOM are normal. Pupils are equal, round, and reactive to light.  Neck: Normal range of motion.  Cardiovascular: Normal rate, regular rhythm and normal heart sounds.  Pulmonary/Chest: Effort normal and breath sounds normal.  Abdominal: Soft. Bowel sounds are normal.  Musculoskeletal: Normal range of motion. He exhibits no edema, tenderness or deformity.  Lymphadenopathy:    He has no cervical adenopathy.  Neurological: He is alert and oriented to person, place, and time.  Skin: Skin is warm and dry. No rash noted. No erythema.  Psychiatric: He has a normal mood and affect. His behavior is normal. Judgment and thought content normal.  Vitals reviewed.    Lab Results  Component Value Date   WBC 3.4 (L) 03/06/2017   HGB 10.5 (L) 01/10/2017   HCT 26.7 (L) 03/06/2017   MCV 95.0 03/06/2017   PLT 62 (L) 03/06/2017     Chemistry      Component Value Date/Time   NA 138 03/06/2017 1120   NA 141 01/10/2017 1136   NA 137 12/14/2015 1100   K 3.9 03/06/2017 1120   K 4.4 01/10/2017 1136   K 3.7 12/14/2015 1100   CL 105 03/06/2017 1120   CL 104 01/10/2017 1136   CO2 26 03/06/2017 1120   CO2 27 01/10/2017 1136   CO2 24 12/14/2015 1100   BUN 17 03/06/2017 1120   BUN 19 01/10/2017 1136   BUN 18.4 12/14/2015 1100   CREATININE 1.10 03/06/2017 1120   CREATININE 1.1 01/10/2017 1136   CREATININE 0.9 12/14/2015 1100      Component Value Date/Time   CALCIUM 9.0 03/06/2017 1120   CALCIUM 9.4 01/10/2017 1136   CALCIUM 9.5 12/14/2015 1100   ALKPHOS 113 (H) 03/06/2017 1120   ALKPHOS 112 (H) 01/10/2017 1136   ALKPHOS 102 12/14/2015 1100   AST 19 03/06/2017 1120   AST 22 12/14/2015 1100   ALT 15 03/06/2017 1120   ALT 18 01/10/2017 1136   ALT 10 12/14/2015 1100   BILITOT 0.6 03/06/2017 1120    BILITOT  0.58 12/14/2015 1100      Impression and Plan: Gary Yates is an 84 year old white male. He has relapsed large cell non-Hodgkin lymphoma of the testicle. He was on salvage chemotherapy with Rituxan/bendamustine/Velcade. Again, he is not a candidate for aggressive intervention. He definitely is not a candidate for stem cell transplantation.  I told him to stop the Plavix.  I think his platelet count will continue to drop.  Again, he will be on ciprofloxacin to help with infections.  He is on Coumadin.  I think that Coumadin will be okay for him.  We will plan to get him back in 3 weeks for his next and final dose of RICE.  I spent about 30 minutes with he and his wife and daughter.  Over 50% of the time was face-to-face talking about his lab results and our plans for future treatment and follow-up.   Volanda Napoleon, MD 2/28/201912:05 PM

## 2017-03-06 NOTE — Patient Instructions (Signed)
Implanted Port Home Guide An implanted port is a type of central line that is placed under the skin. Central lines are used to provide IV access when treatment or nutrition needs to be given through a person's veins. Implanted ports are used for long-term IV access. An implanted port may be placed because:  You need IV medicine that would be irritating to the small veins in your hands or arms.  You need long-term IV medicines, such as antibiotics.  You need IV nutrition for a long period.  You need frequent blood draws for lab tests.  You need dialysis.  Implanted ports are usually placed in the chest area, but they can also be placed in the upper arm, the abdomen, or the leg. An implanted port has two main parts:  Reservoir. The reservoir is round and will appear as a small, raised area under your skin. The reservoir is the part where a needle is inserted to give medicines or draw blood.  Catheter. The catheter is a thin, flexible tube that extends from the reservoir. The catheter is placed into a large vein. Medicine that is inserted into the reservoir goes into the catheter and then into the vein.  How will I care for my incision site? Do not get the incision site wet. Bathe or shower as directed by your health care provider. How is my port accessed? Special steps must be taken to access the port:  Before the port is accessed, a numbing cream can be placed on the skin. This helps numb the skin over the port site.  Your health care provider uses a sterile technique to access the port. ? Your health care provider must put on a mask and sterile gloves. ? The skin over your port is cleaned carefully with an antiseptic and allowed to dry. ? The port is gently pinched between sterile gloves, and a needle is inserted into the port.  Only "non-coring" port needles should be used to access the port. Once the port is accessed, a blood return should be checked. This helps ensure that the port  is in the vein and is not clogged.  If your port needs to remain accessed for a constant infusion, a clear (transparent) bandage will be placed over the needle site. The bandage and needle will need to be changed every week, or as directed by your health care provider.  Keep the bandage covering the needle clean and dry. Do not get it wet. Follow your health care provider's instructions on how to take a shower or bath while the port is accessed.  If your port does not need to stay accessed, no bandage is needed over the port.  What is flushing? Flushing helps keep the port from getting clogged. Follow your health care provider's instructions on how and when to flush the port. Ports are usually flushed with saline solution or a medicine called heparin. The need for flushing will depend on how the port is used.  If the port is used for intermittent medicines or blood draws, the port will need to be flushed: ? After medicines have been given. ? After blood has been drawn. ? As part of routine maintenance.  If a constant infusion is running, the port may not need to be flushed.  How long will my port stay implanted? The port can stay in for as long as your health care provider thinks it is needed. When it is time for the port to come out, surgery will be   done to remove it. The procedure is similar to the one performed when the port was put in. When should I seek immediate medical care? When you have an implanted port, you should seek immediate medical care if:  You notice a bad smell coming from the incision site.  You have swelling, redness, or drainage at the incision site.  You have more swelling or pain at the port site or the surrounding area.  You have a fever that is not controlled with medicine.  This information is not intended to replace advice given to you by your health care provider. Make sure you discuss any questions you have with your health care provider. Document  Released: 12/24/2004 Document Revised: 06/01/2015 Document Reviewed: 08/31/2012 [employer] Interactive Patient Education  2017 [employer]  

## 2017-03-10 ENCOUNTER — Encounter: Payer: Self-pay | Admitting: Family Medicine

## 2017-03-10 ENCOUNTER — Ambulatory Visit: Payer: Medicare Other | Admitting: Family Medicine

## 2017-03-10 ENCOUNTER — Ambulatory Visit (INDEPENDENT_AMBULATORY_CARE_PROVIDER_SITE_OTHER): Payer: Medicare Other | Admitting: General Practice

## 2017-03-10 VITALS — BP 98/54 | HR 88 | Temp 98.2°F | Wt 238.8 lb

## 2017-03-10 DIAGNOSIS — E782 Mixed hyperlipidemia: Secondary | ICD-10-CM

## 2017-03-10 DIAGNOSIS — E1142 Type 2 diabetes mellitus with diabetic polyneuropathy: Secondary | ICD-10-CM | POA: Diagnosis not present

## 2017-03-10 DIAGNOSIS — R5383 Other fatigue: Secondary | ICD-10-CM

## 2017-03-10 DIAGNOSIS — I10 Essential (primary) hypertension: Secondary | ICD-10-CM | POA: Diagnosis not present

## 2017-03-10 DIAGNOSIS — E1140 Type 2 diabetes mellitus with diabetic neuropathy, unspecified: Secondary | ICD-10-CM

## 2017-03-10 DIAGNOSIS — Z7901 Long term (current) use of anticoagulants: Secondary | ICD-10-CM

## 2017-03-10 DIAGNOSIS — G4700 Insomnia, unspecified: Secondary | ICD-10-CM | POA: Diagnosis not present

## 2017-03-10 DIAGNOSIS — E1165 Type 2 diabetes mellitus with hyperglycemia: Secondary | ICD-10-CM | POA: Diagnosis not present

## 2017-03-10 DIAGNOSIS — I480 Paroxysmal atrial fibrillation: Secondary | ICD-10-CM

## 2017-03-10 DIAGNOSIS — R400 Somnolence: Secondary | ICD-10-CM

## 2017-03-10 LAB — POCT INR: INR: 1.7

## 2017-03-10 LAB — POCT GLYCOSYLATED HEMOGLOBIN (HGB A1C): Hemoglobin A1C: 6.4

## 2017-03-10 NOTE — Patient Instructions (Addendum)
Pre visit review using our clinic review tool, if applicable. No additional management support is needed unless otherwise documented below in the visit note.  Take 2 tablets today and then take 1 1/2 tablets daily except 1 tablet on Wednesdays. Re-check in 4 weeks.

## 2017-03-10 NOTE — Progress Notes (Signed)
Subjective:     Patient ID: Gary Yates., male   DOB: 1933/08/15, 84 y.o.   MRN: [id number]  HPI Patient seen today for medical follow-up. He has several chronic problems including history of obesity, type 2 diabetes, non-Hodgkin lymphoma, chronic kidney disease, restless leg syndrome, low testosterone, hyperlipidemia, chronic lower extremity edema, atrial fibrillation, hypertension, CAD. Here today to follow-up several issues  Type 2 diabetes. He's had substantial weight loss since diagnosed with lymphoma couple years ago and A1c's have been much improved. A1c today 6.4%. He is on low-dose long-acting insulin (Toujeo) 20 units once daily  He has long-standing history of peripheral neuropathy. He's had both painful neuropathy as well as substantial numbness in the bottom of both feet which is making balance more challenging. He ambulates with a walker almost consistently at this point. Denies any recent fall  Complains of increasing fatigue. Poor sleep quality. Frequent daytime napping. Daytime somnolence. Patient was referred for sleep study years ago but he left before the test was done. He states he cannot sleep on his back. He also had concerns about using the equipment back several years ago. He is willing to reconsider possible sleep study at this time.  He's had some anemia from his chemotherapy treatments and had recent hemoglobin 8.2 which went up to 10.2 following blood transfusion. Most recent hemoglobin 8.9  Past Medical History:  Diagnosis Date  . Anxiety   . Atrial fibrillation ([place])   . COPD (chronic obstructive pulmonary disease) ([place])    pt. denies  . Coronary artery disease    a. h/o Overlapping stents RCA;  b. 06/2011 Cath: patent stents, nonobs dzs, NL EF.  . Diabetic peripheral neuropathy ([place])   . Diffuse non-Hodgkin's lymphoma of testis ([place]) 09/28/2015  . DM (diabetes mellitus) ([place])    Type 2, peripheral neuropathy.  . Dyspnea    with exertion  . Dysrhythmia    . GERD (gastroesophageal reflux disease)   . Headache   . History of bronchitis   . History of kidney stones   . History of radiation therapy 02/19/16 - 03/13/16   Testis/Scrotum: 32.4 Gy in 18 fractions  . History of radiation therapy 08/07/16-08/20/16   left adrenal gland mass treated to 30 Gy in 10 fractions  . Hyperlipidemia   . Hypertension   . Low testosterone   . Nephrolithiasis   . Osteoarthritis    shoulder  . Restless leg   . SVT (supraventricular tachycardia) ([place])   . Urinary frequency   . Wears partial dentures    upper and lower   Past Surgical History:  Procedure Laterality Date  . APPENDECTOMY    . [place]  . CARDIAC CATHETERIZATION  01/2013  . CATARACT EXTRACTION, BILATERAL    . CHOLECYSTECTOMY    . COLONOSCOPY    . CORONARY ANGIOPLASTY  2004  . EYE SURGERY Bilateral    cataracts  . IR GENERIC HISTORICAL  10/05/2015   IR US GUIDE VASC ACCESS RIGHT 10/05/2015 Marybelle Killings, MD [facility]INTER[facility] RAD  . IR GENERIC HISTORICAL  10/05/2015   IR FLUORO GUIDE PORT INSERTION RIGHT 10/05/2015 Marybelle Killings, MD [facility]  . LEFT HEART CATHETERIZATION WITH CORONARY ANGIOGRAM N/A 06/18/2011   Procedure: LEFT HEART CATHETERIZATION WITH CORONARY ANGIOGRAM;  Surgeon: Peter M Martinique, MD;  [facility];  Service: Cardiovascular;  Laterality: N/A;  . LEFT HEART CATHETERIZATION WITH CORONARY ANGIOGRAM N/A 01/27/2013   Procedure: LEFT HEART CATHETERIZATION WITH CORONARY ANGIOGRAM;  Surgeon:  Burnell Blanks, MD;  Location: [facility] CATH LAB;  Service: Cardiovascular;  Laterality: N/A;  . LUMBAR LAMINECTOMY/DECOMPRESSION MICRODISCECTOMY N/A 02/07/2015   Procedure: Lumbar three-Sacral one Decompression;  Surgeon: Kevan Ny Ditty, MD;  Location: [facility]URO ORS;  Service: Neurosurgery;  Laterality: N/A;  L3 to S1 Decompression  . MULTIPLE TOOTH EXTRACTIONS    . ORCHIECTOMY Right 09/01/2015   Procedure: RIGHT ORCHIECTOMY;  Surgeon: Kathie Rhodes, MD;  Location: [facility] ORS;   Service: Urology;  Laterality: Right;  . ROTATOR CUFF REPAIR Left     reports that he quit smoking about 38 years ago. His smoking use included cigarettes. He has a 30.00 pack-year smoking history. he has never used smokeless tobacco. He reports that he does not drink alcohol or use drugs. family history includes Alzheimer's disease in his mother; Arthritis in his brother and sister; Coronary artery disease in his unknown relative and unknown relative; Heart disease in his brother, father, mother, and sister; Migraines in his daughter and father; Obesity in his sister and son; Prostate cancer in his brother; Sleep apnea in his son; Thyroid disease in his daughter; Ulcers in his father. Allergies  Allergen Reactions  . Ace Inhibitors Other (See Comments)    cough  . Codeine Nausea Only and Rash       . Penicillins Rash    Childhood allergy Has patient had a PCN reaction causing immediate rash, facial/tongue/throat swelling, SOB or lightheadedness with hypotension: Yes Has patient had a PCN reaction causing severe rash involving mucus membranes or skin necrosis: Yes Has patient had a PCN reaction that required hospitalization No Has patient had a PCN reaction occurring within the last 10 years: No If all of the above answers are "NO", then may proceed with Cephalosporin use.      Review of Systems  Constitutional: Positive for fatigue. Negative for chills and fever.  Eyes: Negative for visual disturbance.  Respiratory: Negative for cough, chest tightness and shortness of breath.   Cardiovascular: Negative for chest pain, palpitations and leg swelling.  Gastrointestinal: Negative for abdominal pain, nausea and vomiting.  Endocrine: Negative for polydipsia and polyuria.  Genitourinary: Negative for dysuria.  Neurological: Positive for weakness and numbness. Negative for dizziness, syncope, light-headedness and headaches.  Psychiatric/Behavioral: Positive for sleep disturbance. Negative  for agitation.       Objective:   Physical Exam  Constitutional: He is oriented to person, place, and time. He appears well-developed and well-nourished.  Neck: Neck supple.  Cardiovascular: Normal rate and regular rhythm.  Pulmonary/Chest: Effort normal and breath sounds normal. No respiratory distress. He has no wheezes. He has no rales.  Musculoskeletal:  Has support hose on bilaterally  Neurological: He is alert and oriented to person, place, and time.       Assessment:     #1 type 2 diabetes stable and well controlled currently with A1c 6.4%  #2 fatigue. Likely multifactorial and related to his chemotherapy, chronic anemia, question component of obstructive sleep apnea  #3 frequent daytime somnolence. Epworth sleep scale score of 27.Marland Kitchen  Also has chronic sleep disturbance  #4 history of chronic atrial fibrillation on Coumadin  #5 hyperlipidemia    Plan:     -Add labs of lipid panel and TSH to his next lab draw -Set up referral for study to rule out obstructive sleep apnea. Very high daytime sleepiness scale score is above -Continue current dosage of insulin. This is doing well with no recent hypoglycemia -Recommend walker use at all times as he has  very high risk of falls with his neuropathy  [name] MD [facility]

## 2017-03-10 NOTE — Patient Instructions (Signed)
We will set up referral to assess for sleep apnea. We need to check TSH (thyroid) and lipids at next blood draw.

## 2017-03-12 ENCOUNTER — Telehealth: Payer: Self-pay | Admitting: Cardiovascular Disease

## 2017-03-12 NOTE — Telephone Encounter (Signed)
New Message   Pt would like for a nurse to give him a call, would not disclose what its regarding.

## 2017-03-12 NOTE — Telephone Encounter (Signed)
Agree. Holding his Plavix is ok. cdm

## 2017-03-12 NOTE — Telephone Encounter (Signed)
Spoke with patient about his Plavix prescription. He is currently being treated for Non Hodgkins and his oncologist ordered him to hold his plavix as his platelets are 62. I told him since he is being treated by his oncologist at this time, he should follow his instructions unless Dr Angelena Form contacts him otherwise. He verbalized understanding and had no additional questions.

## 2017-03-20 ENCOUNTER — Other Ambulatory Visit: Payer: Medicare Other

## 2017-03-20 ENCOUNTER — Ambulatory Visit: Payer: Medicare Other

## 2017-03-21 ENCOUNTER — Ambulatory Visit: Payer: Medicare Other

## 2017-03-24 ENCOUNTER — Ambulatory Visit: Payer: Self-pay

## 2017-03-24 DIAGNOSIS — G2581 Restless legs syndrome: Secondary | ICD-10-CM

## 2017-03-24 NOTE — Telephone Encounter (Signed)
He is already on higher than usual dose of Mirapex for RLS AND also on Gabapentin.  I think rule out OSA will be very important in his evaluation.  Referral has been made to pulmonary.  If he is willing, I would consider referral to neurology for their input regarding his restless leg symptoms.

## 2017-03-24 NOTE — Telephone Encounter (Signed)
Patient phoned in with complaint of increased restless legs at night. He stated he is taking Mirapex 1.5 MG as prescribed at night but the restless legs seem to be getting worse, lately.He possibly could be experiencing Augmentation with this medication the way he explained his restless leg syndrome worsening. He stated he is napping more than his normal during the day. I did ask if he had heard from an office regarding sleep apnea testing. He said NO.   Please advise.

## 2017-03-25 NOTE — Telephone Encounter (Signed)
Patient is aware and agrees to a referral to neurology.  Referral placed.  Samples of this drug were given to the patient, quantity 2 pen, Lot Number [id number] Exp 30/11/20 Toujeo

## 2017-03-26 ENCOUNTER — Inpatient Hospital Stay: Payer: Medicare Other | Attending: Hematology & Oncology

## 2017-03-26 ENCOUNTER — Inpatient Hospital Stay: Payer: Medicare Other

## 2017-03-26 ENCOUNTER — Other Ambulatory Visit: Payer: Self-pay | Admitting: *Deleted

## 2017-03-26 ENCOUNTER — Other Ambulatory Visit: Payer: Self-pay

## 2017-03-26 ENCOUNTER — Inpatient Hospital Stay (HOSPITAL_BASED_OUTPATIENT_CLINIC_OR_DEPARTMENT_OTHER): Payer: Medicare Other | Admitting: Hematology & Oncology

## 2017-03-26 VITALS — BP 113/62 | HR 78 | Temp 97.9°F | Resp 20 | Wt 239.8 lb

## 2017-03-26 VITALS — BP 117/55 | HR 88 | Temp 97.6°F | Resp 20

## 2017-03-26 DIAGNOSIS — G8929 Other chronic pain: Secondary | ICD-10-CM | POA: Insufficient documentation

## 2017-03-26 DIAGNOSIS — C8589 Other specified types of non-Hodgkin lymphoma, extranodal and solid organ sites: Secondary | ICD-10-CM | POA: Insufficient documentation

## 2017-03-26 DIAGNOSIS — Z79899 Other long term (current) drug therapy: Secondary | ICD-10-CM

## 2017-03-26 DIAGNOSIS — Z5112 Encounter for antineoplastic immunotherapy: Secondary | ICD-10-CM | POA: Diagnosis not present

## 2017-03-26 DIAGNOSIS — J029 Acute pharyngitis, unspecified: Secondary | ICD-10-CM | POA: Diagnosis not present

## 2017-03-26 DIAGNOSIS — H538 Other visual disturbances: Secondary | ICD-10-CM

## 2017-03-26 DIAGNOSIS — R600 Localized edema: Secondary | ICD-10-CM | POA: Diagnosis not present

## 2017-03-26 DIAGNOSIS — Z7902 Long term (current) use of antithrombotics/antiplatelets: Secondary | ICD-10-CM | POA: Diagnosis not present

## 2017-03-26 DIAGNOSIS — M549 Dorsalgia, unspecified: Secondary | ICD-10-CM | POA: Diagnosis not present

## 2017-03-26 DIAGNOSIS — Z923 Personal history of irradiation: Secondary | ICD-10-CM | POA: Diagnosis not present

## 2017-03-26 DIAGNOSIS — Z7901 Long term (current) use of anticoagulants: Secondary | ICD-10-CM | POA: Diagnosis not present

## 2017-03-26 DIAGNOSIS — R531 Weakness: Secondary | ICD-10-CM

## 2017-03-26 DIAGNOSIS — I4891 Unspecified atrial fibrillation: Secondary | ICD-10-CM | POA: Diagnosis not present

## 2017-03-26 DIAGNOSIS — Z5111 Encounter for antineoplastic chemotherapy: Secondary | ICD-10-CM | POA: Diagnosis not present

## 2017-03-26 LAB — CBC WITH DIFFERENTIAL (CANCER CENTER ONLY)
Basophils Absolute: 0 10*3/uL (ref 0.0–0.1)
Basophils Relative: 1 %
EOS ABS: 0 10*3/uL (ref 0.0–0.5)
EOS PCT: 1 %
HCT: 26.5 % — ABNORMAL LOW (ref 38.7–49.9)
Hemoglobin: 8.8 g/dL — ABNORMAL LOW (ref 13.0–17.1)
LYMPHS ABS: 0.7 10*3/uL — AB (ref 0.9–3.3)
Lymphocytes Relative: 15 %
MCH: 32.5 pg (ref 28.0–33.4)
MCHC: 33.2 g/dL (ref 32.0–35.9)
MCV: 97.8 fL (ref 82.0–98.0)
MONOS PCT: 16 %
Monocytes Absolute: 0.7 10*3/uL (ref 0.1–0.9)
Neutro Abs: 3.1 10*3/uL (ref 1.5–6.5)
Neutrophils Relative %: 67 %
PLATELETS: 91 10*3/uL — AB (ref 145–400)
RBC: 2.71 MIL/uL — ABNORMAL LOW (ref 4.20–5.70)
RDW: 18.7 % — ABNORMAL HIGH (ref 11.1–15.7)
WBC: 4.5 10*3/uL (ref 4.0–10.0)

## 2017-03-26 LAB — CMP (CANCER CENTER ONLY)
ALT: 16 U/L (ref 10–47)
AST: 21 U/L (ref 11–38)
Albumin: 3.3 g/dL — ABNORMAL LOW (ref 3.5–5.0)
Alkaline Phosphatase: 98 U/L — ABNORMAL HIGH (ref 26–84)
Anion gap: 7 (ref 5–15)
BUN: 20 mg/dL (ref 7–22)
CHLORIDE: 106 mmol/L (ref 98–108)
CO2: 28 mmol/L (ref 18–33)
Calcium: 9.4 mg/dL (ref 8.0–10.3)
Creatinine: 1.4 mg/dL — ABNORMAL HIGH (ref 0.60–1.20)
GLUCOSE: 107 mg/dL (ref 73–118)
POTASSIUM: 4.2 mmol/L (ref 3.3–4.7)
SODIUM: 141 mmol/L (ref 128–145)
Total Bilirubin: 0.6 mg/dL (ref 0.2–1.6)
Total Protein: 6.4 g/dL (ref 6.4–8.1)

## 2017-03-26 LAB — SAMPLE TO BLOOD BANK

## 2017-03-26 LAB — PROTIME-INR
INR: 2.23
Prothrombin Time: 24.5 seconds — ABNORMAL HIGH (ref 11.4–15.2)

## 2017-03-26 LAB — LACTATE DEHYDROGENASE: LDH: 188 U/L (ref 125–245)

## 2017-03-26 MED ORDER — DIPHENHYDRAMINE HCL 25 MG PO CAPS
ORAL_CAPSULE | ORAL | Status: AC
  Filled 2017-03-26: qty 2

## 2017-03-26 MED ORDER — DEXAMETHASONE SODIUM PHOSPHATE 10 MG/ML IJ SOLN
INTRAMUSCULAR | Status: AC
  Filled 2017-03-26: qty 1

## 2017-03-26 MED ORDER — DEXAMETHASONE SODIUM PHOSPHATE 10 MG/ML IJ SOLN
10.0000 mg | Freq: Once | INTRAMUSCULAR | Status: AC
  Administered 2017-03-26: 10 mg via INTRAVENOUS

## 2017-03-26 MED ORDER — SODIUM CHLORIDE 0.9 % IV SOLN
400.0000 mg/m2 | Freq: Once | INTRAVENOUS | Status: AC
  Administered 2017-03-26: 900 mg via INTRAVENOUS
  Filled 2017-03-26: qty 9

## 2017-03-26 MED ORDER — ACETAMINOPHEN 325 MG PO TABS
ORAL_TABLET | ORAL | Status: AC
  Filled 2017-03-26: qty 2

## 2017-03-26 MED ORDER — SODIUM CHLORIDE 0.9 % IV SOLN
375.0000 mg/m2 | Freq: Once | INTRAVENOUS | Status: AC
  Administered 2017-03-26: 900 mg via INTRAVENOUS
  Filled 2017-03-26: qty 50

## 2017-03-26 MED ORDER — DIPHENHYDRAMINE HCL 25 MG PO CAPS
50.0000 mg | ORAL_CAPSULE | Freq: Once | ORAL | Status: AC
  Administered 2017-03-26: 50 mg via ORAL

## 2017-03-26 MED ORDER — HEPARIN SOD (PORK) LOCK FLUSH 100 UNIT/ML IV SOLN
500.0000 [IU] | Freq: Once | INTRAVENOUS | Status: AC | PRN
  Administered 2017-03-26: 500 [IU]
  Filled 2017-03-26: qty 5

## 2017-03-26 MED ORDER — PALONOSETRON HCL INJECTION 0.25 MG/5ML
0.2500 mg | Freq: Once | INTRAVENOUS | Status: AC
  Administered 2017-03-26: 0.25 mg via INTRAVENOUS

## 2017-03-26 MED ORDER — SODIUM CHLORIDE 0.9 % IV SOLN
Freq: Once | INTRAVENOUS | Status: AC
  Administered 2017-03-26: 09:00:00 via INTRAVENOUS

## 2017-03-26 MED ORDER — SODIUM CHLORIDE 0.9% FLUSH
10.0000 mL | INTRAVENOUS | Status: DC | PRN
  Administered 2017-03-26: 10 mL
  Filled 2017-03-26: qty 10

## 2017-03-26 MED ORDER — PALONOSETRON HCL INJECTION 0.25 MG/5ML
INTRAVENOUS | Status: AC
  Filled 2017-03-26: qty 5

## 2017-03-26 MED ORDER — ACETAMINOPHEN 325 MG PO TABS
650.0000 mg | ORAL_TABLET | Freq: Once | ORAL | Status: AC
  Administered 2017-03-26: 650 mg via ORAL

## 2017-03-26 MED ORDER — SODIUM CHLORIDE 0.9 % IV SOLN
Freq: Once | INTRAVENOUS | Status: AC
  Administered 2017-03-26: 15:00:00 via INTRAVENOUS
  Filled 2017-03-26: qty 46

## 2017-03-26 MED ORDER — SODIUM CHLORIDE 0.9 % IV SOLN
72.0000 mg/m2 | Freq: Once | INTRAVENOUS | Status: AC
  Administered 2017-03-26: 160 mg via INTRAVENOUS
  Filled 2017-03-26: qty 8

## 2017-03-26 MED ORDER — MAGIC MOUTHWASH W/LIDOCAINE: 5.0000 mL | Freq: Four times a day (QID) | ORAL | 3 refills | Status: DC | PRN

## 2017-03-26 NOTE — Progress Notes (Signed)
OK to treat with platelet count of 91 per Dr. Marin Olp.

## 2017-03-26 NOTE — Progress Notes (Signed)
Hematology and Oncology Follow Up Visit  Gary Yates [id number] 03-19-1933 84 y.o. 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  He actually looks quite good.  He tolerated his third cycle of treatment nicely.  We are only giving him 2 out of 3 days of treatment.  This really has worked out well for him.  He is had no bleeding.  He has had no diarrhea.  There has been no rashes.  He has multiple health issues.  He has cardiac issues.  He has atrial fibrillation.  He has chronic leg swelling.  He has chronic back pain.  There is been no bleeding.  He has had no mouth sores.  We did give him some mouthwash to help with mouth sore prevention.  Overall, his performance status is ECOG 2.  Medications:  Current Outpatient Medications:  .  ACCU-CHEK AVIVA PLUS test strip, TEST 3 TIMES A DAY (Patient taking differently: TEST 2 TIMES A DAY), Disp: 300 each, Rfl: 5 .  acetaminophen (TYLENOL) 500 MG tablet, Take 1,000 mg by mouth every 6 (six) hours as needed (for pain/fever/headaches.). , Disp: , Rfl:  .  atorvastatin (LIPITOR) 40 MG tablet, TAKE 1 TABLET BY MOUTH DAILY, Disp: 90 tablet, Rfl: 2 .  ciprofloxacin (CIPRO) 500 MG tablet, Take 1 tablet (500 mg total) by mouth 2 (two) times daily., Disp: 14 tablet, Rfl: 1 .  clopidogrel (PLAVIX) 75 MG tablet, TAKE 1 TABLET BY MOUTH DAILY (Patient not taking: Reported on 03/10/2017), Disp: 30 tablet, Rfl: 5 .  dexamethasone (DECADRON) 4 MG tablet, Take 2 tablets one time a day for 2 days starting the day after chemotherapy. Take with food., Disp: 30 tablet, Rfl: 1 .  docusate sodium (COLACE) 50 MG  capsule, Take 1 capsule (50 mg total) by mouth 2 (two) times daily as needed (for constipation)., Disp: 90 capsule, Rfl: 0 .  furosemide (LASIX) 40 MG tablet, Take 1 tablet (40 mg total) by mouth 2 (two) times daily., Disp: 60 tablet, Rfl: 0 .  gabapentin (NEURONTIN) 300 MG capsule, Take 2 capsules (600 mg total) by mouth 3 (three) times daily., Disp: 540 capsule, Rfl: 2 .  KLOR-CON M20 20 MEQ tablet, TAKE 1 TABLET BY MOUTH EVERY DAY, Disp: 90 tablet, Rfl: 0 .  lactulose (CHRONULAC) 10 GM/15ML solution, Take 15 mLs (10 g total) by mouth every 6 (six) hours as needed for mild constipation or moderate constipation., Disp: 240 mL, Rfl: 0 .  lidocaine (XYLOCAINE) 2 % solution, , Disp: , Rfl:  .  magic mouthwash w/lidocaine SOLN, Take 5 mLs by mouth 4 (four) times daily as needed for mouth pain., Disp: 600 mL, Rfl: 3 .  metFORMIN (GLUCOPHAGE) 500 MG tablet, TAKE 1 TABLET BY MOUTH TWICE A DAY WITH A MEAL, Disp: 180 tablet, Rfl: 2 .  metoprolol succinate (TOPROL-XL) 100 MG 24 hr tablet, TAKE 1 TABLET BY MOUTH EVERY DAY IMMEDIATELY FOLLOWING A MEAL, Disp: 90 tablet, Rfl: 1 .  mucosal barrier oral (GELCLAIR) GEL, Take 1 packet by mouth 3 (three) times daily as needed., Disp: 60 packet, Rfl: 2 .  Multiple Vitamins-Minerals (CENTRUM  SILVER 50+MEN) TABS, Take 1 tablet by mouth daily., Disp: , Rfl:  .  Niacin (VITAMIN B-3 PO), Take by mouth daily., Disp: , Rfl:  .  nitroGLYCERIN (NITROSTAT) 0.4 MG SL tablet, Place 1 tablet (0.4 mg total) under the tongue every 5 (five) minutes as needed. Chest pain, Disp: 25 tablet, Rfl: 6 .  nystatin (MYCOSTATIN) 100000 UNIT/ML suspension, , Disp: , Rfl:  .  ondansetron (ZOFRAN) 4 MG tablet, Take 1 tablet (4 mg total) by mouth every 8 (eight) hours as needed for nausea or vomiting., Disp: 30 tablet, Rfl: 0 .  ondansetron (ZOFRAN) 8 MG tablet, Take 1 tablet (8 mg total) by mouth 2 (two) times daily as needed. Start on day 6 of chemotherapy., Disp: 30 tablet, Rfl: 1 .  oxybutynin  (DITROPAN XL) 15 MG 24 hr tablet, Take 15 mg by mouth at bedtime. , Disp: , Rfl:  .  oxymetazoline (AFRIN) 0.05 % nasal spray, Place 1-2 sprays into both nostrils 2 (two) times daily as needed for congestion., Disp: , Rfl:  .  pantoprazole (PROTONIX) 40 MG tablet, TAKE 1 TABLET BY MOUTH EVERY DAY, Disp: 90 tablet, Rfl: 3 .  polyethylene glycol powder (MIRALAX) powder, Take 17 g by mouth 2 (two) times daily as needed., Disp: 255 g, Rfl: 0 .  pramipexole (MIRAPEX) 1.5 MG tablet, Take two tablets every night., Disp: 180 tablet, Rfl: 3 .  prochlorperazine (COMPAZINE) 10 MG tablet, Take 1 tablet (10 mg total) by mouth every 6 (six) hours as needed (Nausea or vomiting)., Disp: 30 tablet, Rfl: 1 .  tamsulosin (FLOMAX) 0.4 MG CAPS capsule, Take 0.4 mg by mouth at bedtime., Disp: , Rfl:  .  temazepam (RESTORIL) 30 MG capsule, TAKE ONE CAPSULE AT BEDTIME AS NEEDED SLEEP, Disp: 15 capsule, Rfl: 0 .  TOUJEO SOLOSTAR 300 UNIT/ML SOPN, INJECT 20 UNITS INTO THE SKIN DAILY AFTER BREAKFAST., Disp: 15 mL, Rfl: 1 .  Trospium Chloride 60 MG CP24, Take 60 mg by mouth daily., Disp: , Rfl:  .  vitamin B-12 (CYANOCOBALAMIN) 1000 MCG tablet, Take 1,000 mcg by mouth daily., Disp: , Rfl:  .  warfarin (COUMADIN) 5 MG tablet, Take as directed by anticoagulation clinic, Disp: 50 tablet, Rfl: 1 No current facility-administered medications for this visit.   Facility-Administered Medications Ordered in Other Visits:  .  acetaminophen (TYLENOL) tablet 650 mg, 650 mg, Oral, Once, [place], Sarah M, NP .  pegfilgrastim (NEULASTA ONPRO KIT) injection 6 mg, 6 mg, Subcutaneous, Once, Ennever, Peter R, MD .  sodium chloride flush (NS) 0.9 % injection 10 mL, 10 mL, Intracatheter, PRN, Volanda Napoleon, MD, 10 mL at 02/27/17 1503 .  testosterone cypionate (DEPOTESTOTERONE CYPIONATE) injection 200 mg, 200 mg, Intramuscular, Q28 days, [name], Alinda Sierras, MD, 200 mg at 12/26/11 0850 .  testosterone cypionate (DEPOTESTOTERONE CYPIONATE)  injection 200 mg, 200 mg, Intramuscular, Q28 days, [name], Alinda Sierras, MD, 200 mg at 01/23/12 0908 .  testosterone cypionate (DEPOTESTOTERONE CYPIONATE) injection 200 mg, 200 mg, Intramuscular, Q28 days, [name], Alinda Sierras, MD, 200 mg at 02/25/12 0848 .  testosterone cypionate (DEPOTESTOTERONE CYPIONATE) injection 200 mg, 200 mg, Intramuscular, Q28 days, [name], MD, 200 mg at 06/03/12 1437  Allergies:  Allergies  Allergen Reactions  . Ace Inhibitors Other (See Comments)    cough  . Codeine Nausea Only and Rash       . Penicillins Rash    Childhood allergy Has patient had a PCN reaction causing immediate rash, facial/tongue/throat swelling, SOB or lightheadedness with  hypotension: Yes Has patient had a PCN reaction causing severe rash involving mucus membranes or skin necrosis: Yes Has patient had a PCN reaction that required hospitalization No Has patient had a PCN reaction occurring within the last 10 years: No If all of the above answers are "NO", then may proceed with Cephalosporin use.     Past Medical History, Surgical history, Social history, and Family History were reviewed and updated.  Review of Systems: Review of Systems  HENT: Positive for sinus pain and sore throat.   Eyes: Positive for blurred vision.  Respiratory: Positive for wheezing.   Cardiovascular: Positive for leg swelling.  Gastrointestinal: Negative.   Genitourinary: Negative.   Musculoskeletal: Positive for back pain.  Skin: Negative.   Neurological: Positive for weakness.  Endo/Heme/Allergies: Negative.   Psychiatric/Behavioral: Negative.     Physical Exam:  vitals were not taken for this visit.   Wt Readings from Last 3 Encounters:  03/10/17 238 lb 12.8 oz (108.3 kg)  03/06/17 240 lb (108.9 kg)  02/26/17 234 lb 12 oz (106.5 kg)      Physical Exam  Constitutional: He is oriented to person, place, and time.  HENT:  Head: Normocephalic and atraumatic.  Mouth/Throat: Oropharynx is  clear and moist.  Eyes: EOM are normal. Pupils are equal, round, and reactive to light.  Neck: Normal range of motion.  Cardiovascular: Normal rate, regular rhythm and normal heart sounds.  Pulmonary/Chest: Effort normal and breath sounds normal.  Abdominal: Soft. Bowel sounds are normal.  Musculoskeletal: Normal range of motion. He exhibits no edema, tenderness or deformity.  Lymphadenopathy:    He has no cervical adenopathy.  Neurological: He is alert and oriented to person, place, and time.  Skin: Skin is warm and dry. No rash noted. No erythema.  Psychiatric: He has a normal mood and affect. His behavior is normal. Judgment and thought content normal.  Vitals reviewed.    Lab Results  Component Value Date   WBC 4.5 03/26/2017   HGB 10.5 (L) 01/10/2017   HCT 26.5 (L) 03/26/2017   MCV 97.8 03/26/2017   PLT 91 (L) 03/26/2017     Chemistry      Component Value Date/Time   NA 138 03/06/2017 1120   NA 141 01/10/2017 1136   NA 137 12/14/2015 1100   K 3.9 03/06/2017 1120   K 4.4 01/10/2017 1136   K 3.7 12/14/2015 1100   CL 105 03/06/2017 1120   CL 104 01/10/2017 1136   CO2 26 03/06/2017 1120   CO2 27 01/10/2017 1136   CO2 24 12/14/2015 1100   BUN 17 03/06/2017 1120   BUN 19 01/10/2017 1136   BUN 18.4 12/14/2015 1100   CREATININE 1.10 03/06/2017 1120   CREATININE 1.1 01/10/2017 1136   CREATININE 0.9 12/14/2015 1100      Component Value Date/Time   CALCIUM 9.0 03/06/2017 1120   CALCIUM 9.4 01/10/2017 1136   CALCIUM 9.5 12/14/2015 1100   ALKPHOS 113 (H) 03/06/2017 1120   ALKPHOS 112 (H) 01/10/2017 1136   ALKPHOS 102 12/14/2015 1100   AST 19 03/06/2017 1120   AST 22 12/14/2015 1100   ALT 15 03/06/2017 1120   ALT 18 01/10/2017 1136   ALT 10 12/14/2015 1100   BILITOT 0.6 03/06/2017 1120   BILITOT 0.58 12/14/2015 1100      Impression and Plan: Gary Yates is an 84 year old white male. He has relapsed large cell non-Hodgkin lymphoma of the testicle. He was on salvage  chemotherapy with Rituxan/bendamustine/Velcade. Again,  he is not a candidate for aggressive intervention. He definitely is not a candidate for stem cell transplantation.  We will go ahead with his fourth and final cycle of treatment.  Again, he is in remission after 2 cycles of chemotherapy.  We will give him only 2 days of treatment.  He still will get the Neupogen injection.  I will set him up with a PET scan in about 1 month.  We will plan to see him back in 6 weeks.  If he needs to have back surgery, I would not see a problem with him having this as long as his blood counts have normalized.  Volanda Napoleon, MD 3/20/20198:43 AM

## 2017-03-27 ENCOUNTER — Other Ambulatory Visit: Payer: Self-pay

## 2017-03-27 ENCOUNTER — Inpatient Hospital Stay: Payer: Medicare Other

## 2017-03-27 VITALS — BP 116/46 | HR 86 | Temp 97.7°F | Resp 18

## 2017-03-27 DIAGNOSIS — Z5111 Encounter for antineoplastic chemotherapy: Secondary | ICD-10-CM | POA: Diagnosis not present

## 2017-03-27 DIAGNOSIS — C8589 Other specified types of non-Hodgkin lymphoma, extranodal and solid organ sites: Secondary | ICD-10-CM

## 2017-03-27 MED ORDER — ACETAMINOPHEN 325 MG PO TABS
ORAL_TABLET | ORAL | Status: AC
  Filled 2017-03-27: qty 2

## 2017-03-27 MED ORDER — DEXAMETHASONE SODIUM PHOSPHATE 10 MG/ML IJ SOLN
10.0000 mg | Freq: Once | INTRAMUSCULAR | Status: AC
Start: 2017-03-27 — End: 2017-03-27
  Administered 2017-03-27: 10 mg via INTRAVENOUS

## 2017-03-27 MED ORDER — DEXAMETHASONE SODIUM PHOSPHATE 10 MG/ML IJ SOLN
INTRAMUSCULAR | Status: AC
Start: 2017-03-27 — End: ?
  Filled 2017-03-27: qty 1

## 2017-03-27 MED ORDER — HEPARIN SOD (PORK) LOCK FLUSH 100 UNIT/ML IV SOLN
500.0000 [IU] | Freq: Once | INTRAVENOUS | Status: AC | PRN
  Administered 2017-03-27: 500 [IU]
  Filled 2017-03-27: qty 5

## 2017-03-27 MED ORDER — SODIUM CHLORIDE 0.9 % IV SOLN
72.0000 mg/m2 | Freq: Once | INTRAVENOUS | Status: AC
  Administered 2017-03-27: 160 mg via INTRAVENOUS
  Filled 2017-03-27: qty 8

## 2017-03-27 MED ORDER — SODIUM CHLORIDE 0.9% FLUSH
10.0000 mL | INTRAVENOUS | Status: DC | PRN
  Administered 2017-03-27: 10 mL
  Filled 2017-03-27: qty 10

## 2017-03-27 MED ORDER — SODIUM CHLORIDE 0.9 % IV SOLN
Freq: Once | INTRAVENOUS | Status: AC
  Administered 2017-03-27: 12:00:00 via INTRAVENOUS
  Filled 2017-03-27: qty 46

## 2017-03-27 MED ORDER — SODIUM CHLORIDE 0.9 % IV SOLN
400.0000 mg/m2 | Freq: Once | INTRAVENOUS | Status: AC
  Administered 2017-03-27: 900 mg via INTRAVENOUS
  Filled 2017-03-27: qty 9

## 2017-03-27 MED ORDER — PEGFILGRASTIM 6 MG/0.6ML ~~LOC~~ PSKT
6.0000 mg | PREFILLED_SYRINGE | Freq: Once | SUBCUTANEOUS | Status: AC
  Administered 2017-03-27: 6 mg via SUBCUTANEOUS

## 2017-03-27 MED ORDER — ACETAMINOPHEN 325 MG PO TABS
650.0000 mg | ORAL_TABLET | Freq: Once | ORAL | Status: AC
  Administered 2017-03-27: 650 mg via ORAL

## 2017-03-27 MED ORDER — PEGFILGRASTIM 6 MG/0.6ML ~~LOC~~ PSKT
PREFILLED_SYRINGE | SUBCUTANEOUS | Status: AC
  Filled 2017-03-27: qty 0.6

## 2017-03-27 MED ORDER — SODIUM CHLORIDE 0.9 % IV SOLN
356.0000 mg | Freq: Once | INTRAVENOUS | Status: AC
  Administered 2017-03-27: 360 mg via INTRAVENOUS
  Filled 2017-03-27: qty 36

## 2017-03-27 MED ORDER — SODIUM CHLORIDE 0.9 % IV SOLN
Freq: Once | INTRAVENOUS | Status: AC
  Administered 2017-03-27: 08:00:00 via INTRAVENOUS

## 2017-03-27 NOTE — Progress Notes (Signed)
Patient complains of right shoulder pain rating the pain a 7 on the 0 to 10 pain scale. Laverna Peace, NP notified. Order given and carried out for Tylenol 650 mg PO.

## 2017-03-28 ENCOUNTER — Other Ambulatory Visit: Payer: Self-pay | Admitting: *Deleted

## 2017-03-28 ENCOUNTER — Other Ambulatory Visit: Payer: Self-pay | Admitting: Family Medicine

## 2017-03-28 MED ORDER — MAGIC MOUTHWASH W/LIDOCAINE: 5.0000 mL | Freq: Four times a day (QID) | ORAL | 3 refills | Status: DC | PRN

## 2017-03-31 ENCOUNTER — Other Ambulatory Visit: Payer: Self-pay | Admitting: *Deleted

## 2017-03-31 MED ORDER — MAGIC MOUTHWASH W/LIDOCAINE: 5.0000 mL | Freq: Four times a day (QID) | ORAL | 3 refills | Status: DC | PRN

## 2017-04-01 ENCOUNTER — Other Ambulatory Visit: Payer: Self-pay | Admitting: *Deleted

## 2017-04-01 MED ORDER — MAGIC MOUTHWASH W/LIDOCAINE: 5.0000 mL | Freq: Four times a day (QID) | ORAL | 3 refills | Status: DC | PRN

## 2017-04-14 ENCOUNTER — Ambulatory Visit: Payer: Medicare Other

## 2017-04-16 ENCOUNTER — Telehealth: Payer: Self-pay | Admitting: Family Medicine

## 2017-04-16 NOTE — Telephone Encounter (Signed)
Spoke with patient and he was inquiring about the fax sent for a back brace.   I haven't heard anything yet but will check again in a couple of days

## 2017-04-16 NOTE — Telephone Encounter (Signed)
Copied from [place] [phone]. Topic: General - Other >> Apr 16, 2017  2:32 PM Darl Householder, RMA wrote: Reason for CRM: patient is requesting a call back from Dr. Erick Blinks CMA concerning pre authorization, please return pt call

## 2017-04-21 ENCOUNTER — Ambulatory Visit (INDEPENDENT_AMBULATORY_CARE_PROVIDER_SITE_OTHER): Payer: Medicare Other | Admitting: General Practice

## 2017-04-21 DIAGNOSIS — Z7901 Long term (current) use of anticoagulants: Secondary | ICD-10-CM | POA: Diagnosis not present

## 2017-04-21 DIAGNOSIS — I480 Paroxysmal atrial fibrillation: Secondary | ICD-10-CM

## 2017-04-21 LAB — POCT INR: INR: 3.1

## 2017-04-21 NOTE — Patient Instructions (Addendum)
Pre visit review using our clinic review tool, if applicable. No additional management support is needed unless otherwise documented below in the visit note.  Take 1 tablet today (4/15) and then continue to take 1 1/2 tablets daily except 1 tablet on Wednesdays. Re-check in 4 weeks.

## 2017-04-23 NOTE — Telephone Encounter (Signed)
Spoke with patient and he has not heard anything as of today about his back brace.  He will call the company in a couple of days and give [place] a call back.

## 2017-04-25 ENCOUNTER — Other Ambulatory Visit: Payer: Self-pay | Admitting: Family Medicine

## 2017-04-28 ENCOUNTER — Encounter (HOSPITAL_COMMUNITY)
Admission: RE | Admit: 2017-04-28 | Discharge: 2017-04-28 | Disposition: A | Discharge status: Home / Self Care | Payer: Medicare Other | Source: Ambulatory Visit | Attending: Hematology & Oncology | Admitting: Hematology & Oncology

## 2017-04-28 DIAGNOSIS — C8589 Other specified types of non-Hodgkin lymphoma, extranodal and solid organ sites: Secondary | ICD-10-CM | POA: Diagnosis not present

## 2017-04-28 LAB — GLUCOSE, CAPILLARY: Glucose-Capillary: 116 mg/dL — ABNORMAL HIGH (ref 65–99)

## 2017-04-28 MED ORDER — FLUDEOXYGLUCOSE F - 18 (FDG) INJECTION
11.9000 | Freq: Once | INTRAVENOUS | Status: AC | PRN
  Administered 2017-04-28: 11.9 via INTRAVENOUS

## 2017-05-07 ENCOUNTER — Inpatient Hospital Stay: Payer: Medicare Other

## 2017-05-07 ENCOUNTER — Inpatient Hospital Stay: Payer: Medicare Other | Attending: Hematology & Oncology | Admitting: Hematology & Oncology

## 2017-05-07 ENCOUNTER — Encounter: Payer: Self-pay | Admitting: Pulmonary Disease

## 2017-05-07 ENCOUNTER — Ambulatory Visit (INDEPENDENT_AMBULATORY_CARE_PROVIDER_SITE_OTHER): Payer: Medicare Other | Admitting: Pulmonary Disease

## 2017-05-07 ENCOUNTER — Other Ambulatory Visit: Payer: Self-pay | Admitting: *Deleted

## 2017-05-07 ENCOUNTER — Encounter: Payer: Self-pay | Admitting: Hematology & Oncology

## 2017-05-07 ENCOUNTER — Other Ambulatory Visit: Payer: Self-pay

## 2017-05-07 VITALS — BP 100/60 | HR 84 | Ht 70.0 in | Wt 229.3 lb

## 2017-05-07 VITALS — BP 111/48 | HR 73 | Temp 98.0°F | Wt 229.0 lb

## 2017-05-07 DIAGNOSIS — C8589 Other specified types of non-Hodgkin lymphoma, extranodal and solid organ sites: Secondary | ICD-10-CM

## 2017-05-07 DIAGNOSIS — J029 Acute pharyngitis, unspecified: Secondary | ICD-10-CM

## 2017-05-07 DIAGNOSIS — R531 Weakness: Secondary | ICD-10-CM | POA: Diagnosis not present

## 2017-05-07 DIAGNOSIS — Z79899 Other long term (current) drug therapy: Secondary | ICD-10-CM

## 2017-05-07 DIAGNOSIS — R600 Localized edema: Secondary | ICD-10-CM

## 2017-05-07 DIAGNOSIS — Z452 Encounter for adjustment and management of vascular access device: Secondary | ICD-10-CM | POA: Insufficient documentation

## 2017-05-07 DIAGNOSIS — Z9225 Personal history of immunosupression therapy: Secondary | ICD-10-CM | POA: Diagnosis not present

## 2017-05-07 DIAGNOSIS — R0683 Snoring: Secondary | ICD-10-CM | POA: Diagnosis not present

## 2017-05-07 DIAGNOSIS — Z9221 Personal history of antineoplastic chemotherapy: Secondary | ICD-10-CM | POA: Diagnosis not present

## 2017-05-07 DIAGNOSIS — N184 Chronic kidney disease, stage 4 (severe): Secondary | ICD-10-CM

## 2017-05-07 DIAGNOSIS — Z923 Personal history of irradiation: Secondary | ICD-10-CM | POA: Diagnosis not present

## 2017-05-07 DIAGNOSIS — M549 Dorsalgia, unspecified: Secondary | ICD-10-CM

## 2017-05-07 DIAGNOSIS — D631 Anemia in chronic kidney disease: Secondary | ICD-10-CM

## 2017-05-07 DIAGNOSIS — H538 Other visual disturbances: Secondary | ICD-10-CM

## 2017-05-07 DIAGNOSIS — N189 Chronic kidney disease, unspecified: Secondary | ICD-10-CM | POA: Insufficient documentation

## 2017-05-07 DIAGNOSIS — D649 Anemia, unspecified: Secondary | ICD-10-CM

## 2017-05-07 DIAGNOSIS — Z7901 Long term (current) use of anticoagulants: Secondary | ICD-10-CM

## 2017-05-07 HISTORY — DX: Chronic kidney disease, unspecified: N18.9

## 2017-05-07 HISTORY — DX: Anemia in chronic kidney disease: D63.1

## 2017-05-07 LAB — CBC WITH DIFFERENTIAL (CANCER CENTER ONLY)
Basophils Absolute: 0 10*3/uL (ref 0.0–0.1)
Basophils Relative: 1 %
Eosinophils Absolute: 0.1 10*3/uL (ref 0.0–0.5)
Eosinophils Relative: 2 %
HCT: 23.7 % — ABNORMAL LOW (ref 38.7–49.9)
Hemoglobin: 7.6 g/dL — ABNORMAL LOW (ref 13.0–17.1)
Lymphocytes Relative: 19 %
Lymphs Abs: 0.8 10*3/uL — ABNORMAL LOW (ref 0.9–3.3)
MCH: 33.8 pg — ABNORMAL HIGH (ref 28.0–33.4)
MCHC: 32.1 g/dL (ref 32.0–35.9)
MCV: 105.3 fL — ABNORMAL HIGH (ref 82.0–98.0)
Monocytes Absolute: 0.6 10*3/uL (ref 0.1–0.9)
Monocytes Relative: 14 %
Neutro Abs: 2.7 10*3/uL (ref 1.5–6.5)
Neutrophils Relative %: 64 %
Platelet Count: 116 10*3/uL — ABNORMAL LOW (ref 145–400)
RBC: 2.25 MIL/uL — ABNORMAL LOW (ref 4.20–5.70)
RDW: 17.1 % — ABNORMAL HIGH (ref 11.1–15.7)
WBC Count: 4.2 10*3/uL (ref 4.0–10.0)

## 2017-05-07 LAB — CMP (CANCER CENTER ONLY)
ALT: 16 U/L (ref 10–47)
AST: 24 U/L (ref 11–38)
Albumin: 3.2 g/dL — ABNORMAL LOW (ref 3.5–5.0)
Alkaline Phosphatase: 83 U/L (ref 26–84)
Anion gap: 4 — ABNORMAL LOW (ref 5–15)
BUN: 16 mg/dL (ref 7–22)
CHLORIDE: 111 mmol/L — AB (ref 98–108)
CO2: 25 mmol/L (ref 18–33)
Calcium: 8.8 mg/dL (ref 8.0–10.3)
Creatinine: 1.4 mg/dL — ABNORMAL HIGH (ref 0.60–1.20)
GLUCOSE: 109 mg/dL (ref 73–118)
Potassium: 4.5 mmol/L (ref 3.3–4.7)
SODIUM: 140 mmol/L (ref 128–145)
Total Bilirubin: 0.6 mg/dL (ref 0.2–1.6)
Total Protein: 6.2 g/dL — ABNORMAL LOW (ref 6.4–8.1)

## 2017-05-07 LAB — IRON AND TIBC
Iron: 45 ug/dL (ref 42–163)
Saturation Ratios: 20 % — ABNORMAL LOW (ref 42–163)
TIBC: 225 ug/dL (ref 202–409)
UIBC: 180 ug/dL

## 2017-05-07 LAB — PROTIME-INR
INR: 3.06
Prothrombin Time: 31.4 s — ABNORMAL HIGH (ref 11.4–15.2)

## 2017-05-07 LAB — LACTATE DEHYDROGENASE: LDH: 185 U/L (ref 125–245)

## 2017-05-07 LAB — SAMPLE TO BLOOD BANK

## 2017-05-07 LAB — PREPARE RBC (CROSSMATCH)

## 2017-05-07 LAB — FERRITIN: Ferritin: 249 ng/mL (ref 22–316)

## 2017-05-07 MED ORDER — SODIUM CHLORIDE 0.9% FLUSH
10.0000 mL | INTRAVENOUS | Status: DC | PRN
  Administered 2017-05-07: 10 mL via INTRAVENOUS
  Filled 2017-05-07: qty 10

## 2017-05-07 MED ORDER — HEPARIN SOD (PORK) LOCK FLUSH 100 UNIT/ML IV SOLN
500.0000 [IU] | Freq: Once | INTRAVENOUS | Status: AC
  Administered 2017-05-07: 500 [IU] via INTRAVENOUS
  Filled 2017-05-07: qty 5

## 2017-05-07 NOTE — Patient Instructions (Signed)
Will arrange for home sleep study Will call to arrange for follow up after sleep study reviewed  

## 2017-05-07 NOTE — Progress Notes (Signed)
Hematology and Oncology Follow Up Visit  Gary Yates [id number] [birth year] 84 y.o. 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  He is doing fairly well.  He does have quite a few health issues.  Thankfully, these all seem to be doing pretty well.  We did do a PET scan on him.  The PET scan was done on April 22.  Thankfully, the PET scan did not show any evidence of recurrent lymphoma.  I am surprised that he is so anemic today.  I am not sure why he is anemic.  I did do a rectal exam on him.  I did not find any blood in the stool.  I think we will have to transfuse him given his age and his other health issues.  I talked to him about this.  He was with his wife and son.  They all agree.  I am going to check an erythropoietin level on him.  I am going to check a iron level on him.  It is possible that we still might be looking at the chemotherapy.    Overall, his performance status is ECOG 2.  Medications:  Current Outpatient Medications:  .  ACCU-CHEK AVIVA PLUS test strip, TEST 3 TIMES A DAY (Patient taking differently: TEST 2 TIMES A DAY), Disp: 300 each, Rfl: 5 .  acetaminophen (TYLENOL) 500 MG tablet, Take 1,000 mg by mouth every 6 (six) hours as needed (for pain/fever/headaches.). , Disp: , Rfl:  .  atorvastatin (LIPITOR) 40 MG tablet, TAKE 1 TABLET BY MOUTH DAILY, Disp: 90 tablet, Rfl: 2 .  clopidogrel (PLAVIX) 75 MG tablet, TAKE 1 TABLET BY MOUTH DAILY, Disp: 30 tablet, Rfl: 5 .  dexamethasone (DECADRON) 4 MG tablet, Take 2 tablets one time a day for 2 days starting the day after chemotherapy. Take with  food., Disp: 30 tablet, Rfl: 1 .  docusate sodium (COLACE) 50 MG capsule, Take 1 capsule (50 mg total) by mouth 2 (two) times daily as needed (for constipation)., Disp: 90 capsule, Rfl: 0 .  furosemide (LASIX) 40 MG tablet, Take 1 tablet (40 mg total) by mouth 2 (two) times daily., Disp: 60 tablet, Rfl: 0 .  gabapentin (NEURONTIN) 300 MG capsule, TAKE 2 CAPSULES BY MOUTH 3 TIMES A DAY, Disp: 540 capsule, Rfl: 2 .  KLOR-CON M20 20 MEQ tablet, TAKE 1 TABLET BY MOUTH EVERY DAY, Disp: 90 tablet, Rfl: 0 .  lactulose (CHRONULAC) 10 GM/15ML solution, Take 15 mLs (10 g total) by mouth every 6 (six) hours as needed for mild constipation or moderate constipation., Disp: 240 mL, Rfl: 0 .  magic mouthwash w/lidocaine SOLN, Take 5 mLs by mouth 4 (four) times daily as needed for mouth pain. 1:1 ratio. DIPHENHYDRAMINE HCL (ANTIHISTAMINES - ETHANOLAMINES),ALUM & MAG HYDROXIDE-SIMETH,NYSTATIN (ANTI-INFECTIVES - THROAT),LIDOCAINE HCL (ANESTHETICS TOPICAL ORAL), Disp: 600 mL, Rfl: 3 .  metFORMIN (GLUCOPHAGE) 500 MG tablet, TAKE 1 TABLET BY MOUTH TWICE A DAY WITH A MEAL, Disp: 180 tablet, Rfl: 2 .  metoprolol succinate (TOPROL-XL) 100 MG 24 hr tablet, TAKE 1 TABLET BY MOUTH EVERY DAY IMMEDIATELY FOLLOWING A MEAL,  Disp: 90 tablet, Rfl: 1 .  Multiple Vitamins-Minerals (CENTRUM SILVER 50+MEN) TABS, Take 1 tablet by mouth daily., Disp: , Rfl:  .  Niacin (VITAMIN B-3 PO), Take by mouth daily., Disp: , Rfl:  .  nitroGLYCERIN (NITROSTAT) 0.4 MG SL tablet, Place 1 tablet (0.4 mg total) under the tongue every 5 (five) minutes as needed. Chest pain, Disp: 25 tablet, Rfl: 6 .  ondansetron (ZOFRAN) 8 MG tablet, Take 1 tablet (8 mg total) by mouth 2 (two) times daily as needed. Start on day 6 of chemotherapy., Disp: 30 tablet, Rfl: 1 .  oxybutynin (DITROPAN XL) 15 MG 24 hr tablet, Take 15 mg by mouth at bedtime. , Disp: , Rfl:  .  oxymetazoline (AFRIN) 0.05 % nasal spray, Place 1-2 sprays into both nostrils 2 (two) times daily as  needed for congestion., Disp: , Rfl:  .  pantoprazole (PROTONIX) 40 MG tablet, TAKE 1 TABLET BY MOUTH EVERY DAY, Disp: 90 tablet, Rfl: 3 .  polyethylene glycol powder (MIRALAX) powder, Take 17 g by mouth 2 (two) times daily as needed., Disp: 255 g, Rfl: 0 .  pramipexole (MIRAPEX) 1.5 MG tablet, Take two tablets every night., Disp: 180 tablet, Rfl: 3 .  prochlorperazine (COMPAZINE) 10 MG tablet, Take 1 tablet (10 mg total) by mouth every 6 (six) hours as needed (Nausea or vomiting)., Disp: 30 tablet, Rfl: 1 .  tamsulosin (FLOMAX) 0.4 MG CAPS capsule, Take 0.4 mg by mouth at bedtime., Disp: , Rfl:  .  temazepam (RESTORIL) 30 MG capsule, TAKE ONE CAPSULE AT BEDTIME AS NEEDED SLEEP, Disp: 15 capsule, Rfl: 0 .  TOUJEO SOLOSTAR 300 UNIT/ML SOPN, INJECT 20 UNITS INTO THE SKIN DAILY AFTER BREAKFAST., Disp: 15 mL, Rfl: 1 .  Trospium Chloride 60 MG CP24, Take 60 mg by mouth daily., Disp: , Rfl:  .  vitamin B-12 (CYANOCOBALAMIN) 1000 MCG tablet, Take 1,000 mcg by mouth daily., Disp: , Rfl:  .  warfarin (COUMADIN) 5 MG tablet, Take as directed by anticoagulation clinic, Disp: 50 tablet, Rfl: 1 No current facility-administered medications for this visit.   Facility-Administered Medications Ordered in Other Visits:  .  acetaminophen (TYLENOL) tablet 650 mg, 650 mg, Oral, Once, [place], Sarah M, NP .  pegfilgrastim (NEULASTA ONPRO KIT) injection 6 mg, 6 mg, Subcutaneous, Once, Ennever, Peter R, MD .  sodium chloride flush (NS) 0.9 % injection 10 mL, 10 mL, Intracatheter, PRN, Volanda Napoleon, MD, 10 mL at 02/27/17 1503 .  testosterone cypionate (DEPOTESTOTERONE CYPIONATE) injection 200 mg, 200 mg, Intramuscular, Q28 days, [name], Alinda Sierras, MD, 200 mg at 12/26/11 0850 .  testosterone cypionate (DEPOTESTOTERONE CYPIONATE) injection 200 mg, 200 mg, Intramuscular, Q28 days, [name], Alinda Sierras, MD, 200 mg at 01/23/12 0908 .  testosterone cypionate (DEPOTESTOTERONE CYPIONATE) injection 200 mg, 200 mg,  Intramuscular, Q28 days, [name], Alinda Sierras, MD, 200 mg at 02/25/12 0848 .  testosterone cypionate (DEPOTESTOTERONE CYPIONATE) injection 200 mg, 200 mg, Intramuscular, Q28 days, [name], MD, 200 mg at 06/03/12 1437  Allergies:  Allergies  Allergen Reactions  . Ace Inhibitors Other (See Comments)    cough  . Codeine Nausea Only and Rash       . Penicillins Rash    Childhood allergy Has patient had a PCN reaction causing immediate rash, facial/tongue/throat swelling, SOB or lightheadedness with hypotension: Yes Has patient had a PCN reaction causing severe rash involving mucus membranes or skin necrosis: Yes Has patient had a PCN reaction that required hospitalization No Has patient had a  PCN reaction occurring within the last 10 years: No If all of the above answers are "NO", then may proceed with Cephalosporin use.     Past Medical History, Surgical history, Social history, and Family History were reviewed and updated.  Review of Systems: Review of Systems  HENT: Positive for sinus pain and sore throat.   Eyes: Positive for blurred vision.  Respiratory: Positive for wheezing.   Cardiovascular: Positive for leg swelling.  Gastrointestinal: Negative.   Genitourinary: Negative.   Musculoskeletal: Positive for back pain.  Skin: Negative.   Neurological: Positive for weakness.  Endo/Heme/Allergies: Negative.   Psychiatric/Behavioral: Negative.     Physical Exam:  weight is 229 lb (103.9 kg). His oral temperature is 98 F (36.7 C). His blood pressure is 111/48 (abnormal) and his pulse is 73. His oxygen saturation is 99%.   Wt Readings from Last 3 Encounters:  05/07/17 229 lb (103.9 kg)  03/26/17 239 lb 12 oz (108.7 kg)  03/10/17 238 lb 12.8 oz (108.3 kg)      Physical Exam  Constitutional: He is oriented to person, place, and time.  HENT:  Head: Normocephalic and atraumatic.  Mouth/Throat: Oropharynx is clear and moist.  Eyes: Pupils are equal, round, and  reactive to light. EOM are normal.  Neck: Normal range of motion.  Cardiovascular: Normal rate, regular rhythm and normal heart sounds.  Pulmonary/Chest: Effort normal and breath sounds normal.  Abdominal: Soft. Bowel sounds are normal.  Musculoskeletal: Normal range of motion. He exhibits no edema, tenderness or deformity.  Lymphadenopathy:    He has no cervical adenopathy.  Neurological: He is alert and oriented to person, place, and time.  Skin: Skin is warm and dry. No rash noted. No erythema.  Psychiatric: He has a normal mood and affect. His behavior is normal. Judgment and thought content normal.  Vitals reviewed.    Lab Results  Component Value Date   WBC 4.2 05/07/2017   HGB 7.6 (L) 05/07/2017   HCT 23.7 (L) 05/07/2017   MCV 105.3 (H) 05/07/2017   PLT 116 (L) 05/07/2017     Chemistry      Component Value Date/Time   NA 141 03/26/2017 0815   NA 141 01/10/2017 1136   NA 137 12/14/2015 1100   K 4.2 03/26/2017 0815   K 4.4 01/10/2017 1136   K 3.7 12/14/2015 1100   CL 106 03/26/2017 0815   CL 104 01/10/2017 1136   CO2 28 03/26/2017 0815   CO2 27 01/10/2017 1136   CO2 24 12/14/2015 1100   BUN 20 03/26/2017 0815   BUN 19 01/10/2017 1136   BUN 18.4 12/14/2015 1100   CREATININE 1.40 (H) 03/26/2017 0815   CREATININE 1.1 01/10/2017 1136   CREATININE 0.9 12/14/2015 1100      Component Value Date/Time   CALCIUM 9.4 03/26/2017 0815   CALCIUM 9.4 01/10/2017 1136   CALCIUM 9.5 12/14/2015 1100   ALKPHOS 98 (H) 03/26/2017 0815   ALKPHOS 112 (H) 01/10/2017 1136   ALKPHOS 102 12/14/2015 1100   AST 21 03/26/2017 0815   AST 22 12/14/2015 1100   ALT 16 03/26/2017 0815   ALT 18 01/10/2017 1136   ALT 10 12/14/2015 1100   BILITOT 0.6 03/26/2017 0815   BILITOT 0.58 12/14/2015 1100      Impression and Plan: Mr. Loper is an 84 year old white male. He has relapsed large cell non-Hodgkin lymphoma of the testicle. He was on salvage chemotherapy with Rituxan/bendamustine/Velcade.  Again, he is not a candidate for aggressive  intervention. He definitely is not a candidate for stem cell transplantation.  We will go ahead with his fourth and final cycle of treatment.    I am just happy that he is in remission.  I know that his tumor can certainly come back.  We will have to be careful with this.  We will have to work on his anemia.  We will see what his erythropoietin level is.  I would like to get him back to see [place] in another month.  We spent about 40 minutes with him today.  The anemia is certainly a problem.  Over half the time was spent face-to-face.  Volanda Napoleon, MD 5/1/20199:03 AM

## 2017-05-07 NOTE — Patient Instructions (Signed)
Implanted Port Insertion, Care After °This sheet gives you information about how to care for yourself after your procedure. Your health care provider may also give you more specific instructions. If you have problems or questions, contact your health care provider. °What can I expect after the procedure? °After your procedure, it is common to have: °· Discomfort at the port insertion site. °· Bruising on the skin over the port. This should improve over 3-4 days. ° °Follow these instructions at home: °Port care °· After your port is placed, you will get a manufacturer's information card. The card has information about your port. Keep this card with you at all times. °· Take care of the port as told by your health care provider. Ask your health care provider if you or a family member can get training for taking care of the port at home. A home health care nurse may also take care of the port. °· Make sure to remember what type of port you have. °Incision care °· Follow instructions from your health care provider about how to take care of your port insertion site. Make sure you: °? Wash your hands with soap and water before you change your bandage (dressing). If soap and water are not available, use hand sanitizer. °? Change your dressing as told by your health care provider. °? Leave stitches (sutures), skin glue, or adhesive strips in place. These skin closures may need to stay in place for 2 weeks or longer. If adhesive strip edges start to loosen and curl up, you may trim the loose edges. Do not remove adhesive strips completely unless your health care provider tells you to do that. °· Check your port insertion site every day for signs of infection. Check for: °? More redness, swelling, or pain. °? More fluid or blood. °? Warmth. °? Pus or a bad smell. °General instructions °· Do not take baths, swim, or use a hot tub until your health care provider approves. °· Do not lift anything that is heavier than 10 lb (4.5  kg) for a week, or as told by your health care provider. °· Ask your health care provider when it is okay to: °? Return to work or school. °? Resume usual physical activities or sports. °· Do not drive for 24 hours if you were given a medicine to help you relax (sedative). °· Take over-the-counter and prescription medicines only as told by your health care provider. °· Wear a medical alert bracelet in case of an emergency. This will tell any health care providers that you have a port. °· Keep all follow-up visits as told by your health care provider. This is important. °Contact a health care provider if: °· You cannot flush your port with saline as directed, or you cannot draw blood from the port. °· You have a fever or chills. °· You have more redness, swelling, or pain around your port insertion site. °· You have more fluid or blood coming from your port insertion site. °· Your port insertion site feels warm to the touch. °· You have pus or a bad smell coming from the port insertion site. °Get help right away if: °· You have chest pain or shortness of breath. °· You have bleeding from your port that you cannot control. °Summary °· Take care of the port as told by your health care provider. °· Change your dressing as told by your health care provider. °· Keep all follow-up visits as told by your health care provider. °  This information is not intended to replace advice given to you by your health care provider. Make sure you discuss any questions you have with your health care provider. °Document Released: 10/14/2012 Document Revised: 11/15/2015 Document Reviewed: 11/15/2015 °Elsevier Interactive Patient Education © 2017 [employer] ° °

## 2017-05-07 NOTE — Progress Notes (Signed)
   Subjective:    Patient ID: Gary Blaustein., male    DOB: [birth year], 84 y.o.   MRN: [id number]  HPI    Review of Systems  Constitutional: Positive for appetite change, fatigue and unexpected weight change. Negative for fever.  HENT: Positive for sneezing. Negative for congestion, dental problem, ear pain, nosebleeds, postnasal drip, rhinorrhea, sinus pressure, sore throat and trouble swallowing.   Eyes: Negative for redness and itching.  Respiratory: Positive for cough. Negative for chest tightness, shortness of breath and wheezing.   Cardiovascular: Positive for leg swelling. Negative for palpitations.  Gastrointestinal: Positive for constipation. Negative for nausea and vomiting.  Genitourinary: Negative for dysuria.  Musculoskeletal: Positive for joint swelling.  Skin: Negative for rash.  Allergic/Immunologic: Positive for environmental allergies. Negative for food allergies and immunocompromised state.  Neurological: Positive for headaches.  Hematological: Does not bruise/bleed easily.  Psychiatric/Behavioral: Negative for dysphoric mood. The patient is nervous/anxious.        Objective:   Physical Exam        Assessment & Plan:

## 2017-05-07 NOTE — Progress Notes (Signed)
[facility]  Chief Complaint  Patient presents with  . sleep consult    Pt referred by Dr. Elease Hashimoto MD. Cannot sleep laying down flat on back, has hard time staying asleep    Vital signs: BP 100/60 (BP Location: Left Arm, Cuff Size: Normal)   Pulse 84   Ht 5\' 10"  (1.778 m)   Wt 229 lb 4.8 oz (104 kg)   SpO2 97%   BMI 32.90 kg/m   History of Present Illness: Gary Diemer. is a 84 y.o. male for evaluation of sleep problems.  He has trouble sleeping.  He snores, and his wife says he wakes up with a snort.  He also has neck and back pain.  He is a light sleeper.  He dreams a lot.  He had sleep study in 2006 which didn't show sleep apnea.  He goes to sleep at 11 pm.  He falls asleep in minutes.  He wakes up several times to use the bathroom.  He gets out of bed at 6 am.  He feels tired in the morning.  He denies morning headache.  He does not use anything to help him stay awake.  He uses restoril to help him sleep.  He falls asleep easily when sitting quiet.  He denies sleep walking, sleep talking, bruxism, or nightmares.  There is no history of restless legs.  He denies sleep hallucinations, sleep paralysis, or cataplexy.  The Epworth score is 21 out of 24.    Physical Exam:  General - pleasant Eyes - pupils reactive ENT - no sinus tenderness, no oral exudate, no LAN, poor dentition, MP 2, low laying soft palate Cardiac - regular, no murmur Chest - no wheeze, rales Abd - soft, non tender Ext - no edema Skin - no rashes Neuro - normal strength Psych - normal mood  Discussion: He has snoring sleep disruption, apnea, and daytime sleepiness.  He has hx of hypertension, CAD, DM, and arrhythmia.  I am concerned he could have sleep apnea.  We discussed how sleep apnea can affect various health problems, including risks for hypertension, cardiovascular disease, and diabetes.  We also discussed how sleep disruption can increase risks for  accidents, such as while driving.  Weight loss as a means of improving sleep apnea was also reviewed.  Additional treatment options discussed were CPAP therapy, oral appliance, and surgical intervention.  Assessment/Plan:  Snoring. - will arrange for home sleep study   Patient Instructions  Will arrange for home sleep study Will call to arrange for follow up after sleep study reviewed    Gary Mires, MD [facility] [place] 05/07/2017, 2:19 PM  Flow Sheet  Sleep tests:  Cardiac tests: Echo 09/20/15 >> EF 55 to 60%, grade 1 DD, mild MR  Review of Systems: Constitutional: Positive for appetite change, fatigue and unexpected weight change. Negative for fever.  HENT: Positive for sneezing. Negative for congestion, dental problem, ear pain, nosebleeds, postnasal drip, rhinorrhea, sinus pressure, sore throat and trouble swallowing.   Eyes: Negative for redness and itching.  Respiratory: Positive for cough. Negative for chest tightness, shortness of breath and wheezing.   Cardiovascular: Positive for leg swelling. Negative for palpitations.  Gastrointestinal: Positive for constipation. Negative for nausea and vomiting.  Genitourinary: Negative for dysuria.  Musculoskeletal: Positive for joint swelling.  Skin: Negative for rash.  Allergic/Immunologic: Positive for environmental allergies. Negative for food allergies and immunocompromised state.  Neurological: Positive for headaches.  Hematological: Does not bruise/bleed  easily.  Psychiatric/Behavioral: Negative for dysphoric mood. The patient is nervous/anxious.    Past Medical History: He  has a past medical history of Anemia in chronic renal disease (05/07/2017), Anxiety, Atrial fibrillation ([place]), COPD (chronic obstructive pulmonary disease) ([place]), Coronary artery disease, Diabetic peripheral neuropathy ([place]), Diffuse non-Hodgkin's lymphoma of testis ([place]) (09/28/2015), DM (diabetes mellitus) ([place]), Dyspnea, Dysrhythmia, GERD  (gastroesophageal reflux disease), Headache, History of bronchitis, History of kidney stones, History of radiation therapy (02/19/16 - 03/13/16), History of radiation therapy (08/07/16-08/20/16), Hyperlipidemia, Hypertension, Low testosterone, Nephrolithiasis, Osteoarthritis, Restless leg, SVT (supraventricular tachycardia) ([place]), Urinary frequency, and Wears partial dentures.  Past Surgical History: He  has a past surgical history that includes Cholecystectomy; Cardiac catheterization (01/2013); Coronary angioplasty (2004); Back surgery ([place]); Rotator cuff repair (Left); left heart catheterization with coronary angiogram (N/A, 06/18/2011); left heart catheterization with coronary angiogram (N/A, 01/27/2013); Eye surgery (Bilateral); Colonoscopy; Lumbar laminectomy/decompression microdiscectomy (N/A, 02/07/2015); Orchiectomy (Right, 09/01/2015); ir generic historical (10/05/2015); ir generic historical (10/05/2015); Appendectomy; Cataract extraction, bilateral; and Multiple tooth extractions.  Family History: His family history includes Alzheimer's disease in his mother; Arthritis in his brother and sister; Coronary artery disease in his unknown relative and unknown relative; Heart disease in his brother, father, mother, and sister; Migraines in his daughter and father; Obesity in his sister and son; Prostate cancer in his brother; Sleep apnea in his son; Thyroid disease in his daughter; Ulcers in his father.  Social History: He  reports that he quit smoking about 39 years ago. His smoking use included cigarettes. He has a 30.00 pack-year smoking history. He has never used smokeless tobacco. He reports that he does not drink alcohol or use drugs.  Medications: Allergies as of 05/07/2017      Reactions   Ace Inhibitors Other (See Comments)   cough   Codeine Nausea Only, Rash      Penicillins Rash   Childhood allergy Has patient had a PCN reaction causing immediate rash, facial/tongue/throat swelling, SOB  or lightheadedness with hypotension: Yes Has patient had a PCN reaction causing severe rash involving mucus membranes or skin necrosis: Yes Has patient had a PCN reaction that required hospitalization No Has patient had a PCN reaction occurring within the last 10 years: No If all of the above answers are "NO", then may proceed with Cephalosporin use.      Medication List        Accurate as of 05/07/17  2:19 PM. Always use your most recent med list.          ACCU-CHEK AVIVA PLUS test strip Generic drug:  glucose blood TEST 3 TIMES A DAY   acetaminophen 500 MG tablet Commonly known as:  TYLENOL Take 1,000 mg by mouth every 6 (six) hours as needed (for pain/fever/headaches.).   atorvastatin 40 MG tablet Commonly known as:  LIPITOR TAKE 1 TABLET BY MOUTH DAILY   CENTRUM SILVER 50+MEN Tabs Take 1 tablet by mouth daily.   clopidogrel 75 MG tablet Commonly known as:  PLAVIX TAKE 1 TABLET BY MOUTH DAILY   dexamethasone 4 MG tablet Commonly known as:  DECADRON Take 2 tablets one time a day for 2 days starting the day after chemotherapy. Take with food.   docusate sodium 50 MG capsule Commonly known as:  COLACE Take 1 capsule (50 mg total) by mouth 2 (two) times daily as needed (for constipation).   furosemide 40 MG tablet Commonly known as:  LASIX Take 1 tablet (40 mg total) by mouth 2 (two) times daily.  gabapentin 300 MG capsule Commonly known as:  NEURONTIN TAKE 2 CAPSULES BY MOUTH 3 TIMES A DAY   KLOR-CON M20 20 MEQ tablet Generic drug:  potassium chloride SA TAKE 1 TABLET BY MOUTH EVERY DAY   lactulose 10 GM/15ML solution Commonly known as:  CHRONULAC Take 15 mLs (10 g total) by mouth every 6 (six) hours as needed for mild constipation or moderate constipation.   magic mouthwash w/lidocaine Soln Take 5 mLs by mouth 4 (four) times daily as needed for mouth pain. 1:1 ratio. DIPHENHYDRAMINE HCL (ANTIHISTAMINES - ETHANOLAMINES),ALUM & MAG HYDROXIDE-SIMETH,NYSTATIN  (ANTI-INFECTIVES - THROAT),LIDOCAINE HCL (ANESTHETICS TOPICAL ORAL)   metFORMIN 500 MG tablet Commonly known as:  GLUCOPHAGE TAKE 1 TABLET BY MOUTH TWICE A DAY WITH A MEAL   metoprolol succinate 100 MG 24 hr tablet Commonly known as:  TOPROL-XL TAKE 1 TABLET BY MOUTH EVERY DAY IMMEDIATELY FOLLOWING A MEAL   nitroGLYCERIN 0.4 MG SL tablet Commonly known as:  NITROSTAT Place 1 tablet (0.4 mg total) under the tongue every 5 (five) minutes as needed. Chest pain   ondansetron 8 MG tablet Commonly known as:  ZOFRAN Take 1 tablet (8 mg total) by mouth 2 (two) times daily as needed. Start on day 6 of chemotherapy.   oxybutynin 15 MG 24 hr tablet Commonly known as:  DITROPAN XL Take 15 mg by mouth at bedtime.   oxymetazoline 0.05 % nasal spray Commonly known as:  AFRIN Place 1-2 sprays into both nostrils 2 (two) times daily as needed for congestion.   pantoprazole 40 MG tablet Commonly known as:  PROTONIX TAKE 1 TABLET BY MOUTH EVERY DAY   polyethylene glycol powder powder Commonly known as:  MIRALAX Take 17 g by mouth 2 (two) times daily as needed.   pramipexole 1.5 MG tablet Commonly known as:  MIRAPEX Take two tablets every night.   prochlorperazine 10 MG tablet Commonly known as:  COMPAZINE Take 1 tablet (10 mg total) by mouth every 6 (six) hours as needed (Nausea or vomiting).   tamsulosin 0.4 MG Caps capsule Commonly known as:  FLOMAX Take 0.4 mg by mouth at bedtime.   temazepam 30 MG capsule Commonly known as:  RESTORIL TAKE ONE CAPSULE AT BEDTIME AS NEEDED SLEEP   TOUJEO SOLOSTAR 300 UNIT/ML Sopn Generic drug:  Insulin Glargine INJECT 20 UNITS INTO THE SKIN DAILY AFTER BREAKFAST.   Trospium Chloride 60 MG Cp24 Take 60 mg by mouth daily.   vitamin B-12 1000 MCG tablet Commonly known as:  CYANOCOBALAMIN Take 1,000 mcg by mouth daily.   VITAMIN B-3 PO Take by mouth daily.   warfarin 5 MG tablet Commonly known as:  COUMADIN Take as directed by the  anticoagulation clinic. If you are unsure how to take this medication, talk to your nurse or doctor. Original instructions:  Take as directed by anticoagulation clinic

## 2017-05-07 NOTE — Addendum Note (Signed)
Addended by: Rico Ala on: 05/07/2017 09:39 AM   Modules accepted: Orders, SmartSet

## 2017-05-08 ENCOUNTER — Inpatient Hospital Stay: Payer: Medicare Other

## 2017-05-08 DIAGNOSIS — C8589 Other specified types of non-Hodgkin lymphoma, extranodal and solid organ sites: Secondary | ICD-10-CM | POA: Diagnosis not present

## 2017-05-08 LAB — ERYTHROPOIETIN: Erythropoietin: 38.2 m[IU]/mL — ABNORMAL HIGH (ref 2.6–18.5)

## 2017-05-08 MED ORDER — ACETAMINOPHEN 325 MG PO TABS
650.0000 mg | ORAL_TABLET | Freq: Once | ORAL | Status: AC
  Administered 2017-05-08: 650 mg via ORAL

## 2017-05-08 MED ORDER — ACETAMINOPHEN 325 MG PO TABS
ORAL_TABLET | ORAL | Status: AC
  Filled 2017-05-08: qty 2

## 2017-05-08 MED ORDER — DIPHENHYDRAMINE HCL 25 MG PO CAPS
ORAL_CAPSULE | ORAL | Status: AC
  Filled 2017-05-08: qty 1

## 2017-05-08 NOTE — Patient Instructions (Signed)

## 2017-05-09 LAB — BPAM RBC
BLOOD PRODUCT EXPIRATION DATE: 201905232359
Blood Product Expiration Date: 201905232359
ISSUE DATE / TIME: 201905020859
ISSUE DATE / TIME: 201905020859
UNIT TYPE AND RH: 7300
Unit Type and Rh: 7300

## 2017-05-09 LAB — TYPE AND SCREEN
ABO/RH(D): B POS
ANTIBODY SCREEN: NEGATIVE
UNIT DIVISION: 0
Unit division: 0

## 2017-05-17 ENCOUNTER — Other Ambulatory Visit: Payer: Self-pay | Admitting: Family Medicine

## 2017-05-19 ENCOUNTER — Ambulatory Visit (INDEPENDENT_AMBULATORY_CARE_PROVIDER_SITE_OTHER): Payer: Medicare Other | Admitting: General Practice

## 2017-05-19 DIAGNOSIS — Z7901 Long term (current) use of anticoagulants: Secondary | ICD-10-CM

## 2017-05-19 DIAGNOSIS — I480 Paroxysmal atrial fibrillation: Secondary | ICD-10-CM

## 2017-05-19 LAB — POCT INR: INR: 3.8

## 2017-05-19 NOTE — Patient Instructions (Addendum)
Pre visit review using our clinic review tool, if applicable. No additional management support is needed unless otherwise documented below in the visit note.  Skip coumadin today and then continue to take 1 1/2 tablets daily except 1 tablet on Wednesdays. Re-check in 3 to 4 weeks.

## 2017-05-21 ENCOUNTER — Telehealth: Payer: Self-pay | Admitting: Family Medicine

## 2017-05-21 DIAGNOSIS — N3281 Overactive bladder: Secondary | ICD-10-CM

## 2017-05-21 DIAGNOSIS — Z9181 History of falling: Secondary | ICD-10-CM

## 2017-05-21 NOTE — Telephone Encounter (Signed)
Copied from [place]. Topic: Quick Communication - See Telephone Encounter >> May 21, 2017 12:50 PM Synthia Innocent wrote: CRM for notification. See Telephone encounter for: 05/21/17. Patient is requesting that something else different be called in for over active bladder, current meds not working. Please advise. [facility] [address]

## 2017-05-21 NOTE — Telephone Encounter (Signed)
Spoke with patient and he agrees to urology and PT.  Referrals placed.

## 2017-05-21 NOTE — Telephone Encounter (Signed)
If Ditropan not working, I recommend he see urologist for further evaluation.

## 2017-05-22 ENCOUNTER — Telehealth: Payer: Self-pay | Admitting: Family Medicine

## 2017-05-22 DIAGNOSIS — Z9181 History of falling: Secondary | ICD-10-CM

## 2017-05-22 NOTE — Telephone Encounter (Signed)
Copied from [place] [phone]. Topic: Quick Communication - See Telephone Encounter >> May 22, 2017  9:18 AM Hewitt Shorts wrote: Pt is requesting to talk with someone about gertting physical therapy for the home due to him not being about to drive   Best number [phone]

## 2017-05-23 ENCOUNTER — Encounter: Payer: Self-pay | Admitting: Cardiovascular Disease

## 2017-05-23 NOTE — Telephone Encounter (Signed)
Referral placed.

## 2017-05-27 ENCOUNTER — Other Ambulatory Visit: Payer: Self-pay | Admitting: Family Medicine

## 2017-05-28 ENCOUNTER — Other Ambulatory Visit: Payer: Self-pay | Admitting: General Practice

## 2017-05-28 MED ORDER — WARFARIN SODIUM 5 MG PO TABS: ORAL_TABLET | ORAL | 3 refills | Status: DC

## 2017-05-30 ENCOUNTER — Other Ambulatory Visit: Payer: Self-pay | Admitting: Family Medicine

## 2017-06-04 ENCOUNTER — Inpatient Hospital Stay: Payer: Medicare Other | Admitting: Hematology & Oncology

## 2017-06-04 ENCOUNTER — Inpatient Hospital Stay: Payer: Medicare Other

## 2017-06-04 ENCOUNTER — Other Ambulatory Visit: Payer: Medicare Other

## 2017-06-04 ENCOUNTER — Other Ambulatory Visit: Payer: Self-pay | Admitting: *Deleted

## 2017-06-04 MED ORDER — POLYETHYLENE GLYCOL 3350 17 GM/SCOOP PO POWD: ORAL | 3 refills | Status: DC

## 2017-06-05 ENCOUNTER — Inpatient Hospital Stay (HOSPITAL_COMMUNITY)
Admission: EM | Admit: 2017-06-05 | Discharge: 2017-06-12 | DRG: 872 | Disposition: A | Discharge status: Home Health | Payer: Medicare Other | Attending: Internal Medicine | Admitting: Internal Medicine

## 2017-06-05 ENCOUNTER — Other Ambulatory Visit: Payer: Self-pay

## 2017-06-05 ENCOUNTER — Encounter (HOSPITAL_COMMUNITY): Payer: Self-pay | Admitting: Emergency Medicine

## 2017-06-05 ENCOUNTER — Emergency Department (HOSPITAL_COMMUNITY): Payer: Medicare Other

## 2017-06-05 DIAGNOSIS — N401 Enlarged prostate with lower urinary tract symptoms: Secondary | ICD-10-CM | POA: Diagnosis present

## 2017-06-05 DIAGNOSIS — Z794 Long term (current) use of insulin: Secondary | ICD-10-CM

## 2017-06-05 DIAGNOSIS — R6520 Severe sepsis without septic shock: Secondary | ICD-10-CM | POA: Diagnosis present

## 2017-06-05 DIAGNOSIS — Z79899 Other long term (current) drug therapy: Secondary | ICD-10-CM

## 2017-06-05 DIAGNOSIS — N182 Chronic kidney disease, stage 2 (mild): Secondary | ICD-10-CM | POA: Diagnosis present

## 2017-06-05 DIAGNOSIS — E876 Hypokalemia: Secondary | ICD-10-CM | POA: Diagnosis present

## 2017-06-05 DIAGNOSIS — A411 Sepsis due to other specified staphylococcus: Secondary | ICD-10-CM | POA: Diagnosis not present

## 2017-06-05 DIAGNOSIS — I2510 Atherosclerotic heart disease of native coronary artery without angina pectoris: Secondary | ICD-10-CM | POA: Diagnosis present

## 2017-06-05 DIAGNOSIS — I129 Hypertensive chronic kidney disease with stage 1 through stage 4 chronic kidney disease, or unspecified chronic kidney disease: Secondary | ICD-10-CM | POA: Diagnosis present

## 2017-06-05 DIAGNOSIS — A419 Sepsis, unspecified organism: Secondary | ICD-10-CM

## 2017-06-05 DIAGNOSIS — I472 Ventricular tachycardia: Secondary | ICD-10-CM | POA: Diagnosis present

## 2017-06-05 DIAGNOSIS — C8590 Non-Hodgkin lymphoma, unspecified, unspecified site: Secondary | ICD-10-CM | POA: Diagnosis present

## 2017-06-05 DIAGNOSIS — K219 Gastro-esophageal reflux disease without esophagitis: Secondary | ICD-10-CM | POA: Diagnosis present

## 2017-06-05 DIAGNOSIS — N390 Urinary tract infection, site not specified: Secondary | ICD-10-CM | POA: Diagnosis present

## 2017-06-05 DIAGNOSIS — Z9221 Personal history of antineoplastic chemotherapy: Secondary | ICD-10-CM

## 2017-06-05 DIAGNOSIS — E1122 Type 2 diabetes mellitus with diabetic chronic kidney disease: Secondary | ICD-10-CM | POA: Diagnosis present

## 2017-06-05 DIAGNOSIS — N3281 Overactive bladder: Secondary | ICD-10-CM | POA: Diagnosis present

## 2017-06-05 DIAGNOSIS — E782 Mixed hyperlipidemia: Secondary | ICD-10-CM | POA: Diagnosis present

## 2017-06-05 DIAGNOSIS — E1142 Type 2 diabetes mellitus with diabetic polyneuropathy: Secondary | ICD-10-CM | POA: Diagnosis present

## 2017-06-05 DIAGNOSIS — R339 Retention of urine, unspecified: Secondary | ICD-10-CM

## 2017-06-05 DIAGNOSIS — D701 Agranulocytosis secondary to cancer chemotherapy: Secondary | ICD-10-CM

## 2017-06-05 DIAGNOSIS — I10 Essential (primary) hypertension: Secondary | ICD-10-CM | POA: Diagnosis present

## 2017-06-05 DIAGNOSIS — E1165 Type 2 diabetes mellitus with hyperglycemia: Secondary | ICD-10-CM

## 2017-06-05 DIAGNOSIS — D61818 Other pancytopenia: Secondary | ICD-10-CM | POA: Diagnosis present

## 2017-06-05 DIAGNOSIS — R791 Abnormal coagulation profile: Secondary | ICD-10-CM | POA: Diagnosis present

## 2017-06-05 DIAGNOSIS — I890 Lymphedema, not elsewhere classified: Secondary | ICD-10-CM | POA: Diagnosis present

## 2017-06-05 DIAGNOSIS — Z923 Personal history of irradiation: Secondary | ICD-10-CM

## 2017-06-05 DIAGNOSIS — I082 Rheumatic disorders of both aortic and tricuspid valves: Secondary | ICD-10-CM | POA: Diagnosis present

## 2017-06-05 DIAGNOSIS — C8589 Other specified types of non-Hodgkin lymphoma, extranodal and solid organ sites: Secondary | ICD-10-CM

## 2017-06-05 DIAGNOSIS — E1140 Type 2 diabetes mellitus with diabetic neuropathy, unspecified: Secondary | ICD-10-CM | POA: Diagnosis present

## 2017-06-05 DIAGNOSIS — Z9861 Coronary angioplasty status: Secondary | ICD-10-CM

## 2017-06-05 DIAGNOSIS — J449 Chronic obstructive pulmonary disease, unspecified: Secondary | ICD-10-CM | POA: Diagnosis present

## 2017-06-05 DIAGNOSIS — Z87891 Personal history of nicotine dependence: Secondary | ICD-10-CM

## 2017-06-05 DIAGNOSIS — R310 Gross hematuria: Secondary | ICD-10-CM | POA: Diagnosis present

## 2017-06-05 DIAGNOSIS — Z7902 Long term (current) use of antithrombotics/antiplatelets: Secondary | ICD-10-CM

## 2017-06-05 DIAGNOSIS — K5900 Constipation, unspecified: Secondary | ICD-10-CM

## 2017-06-05 DIAGNOSIS — N179 Acute kidney failure, unspecified: Secondary | ICD-10-CM | POA: Diagnosis present

## 2017-06-05 DIAGNOSIS — Z7901 Long term (current) use of anticoagulants: Secondary | ICD-10-CM

## 2017-06-05 DIAGNOSIS — T451X5A Adverse effect of antineoplastic and immunosuppressive drugs, initial encounter: Secondary | ICD-10-CM

## 2017-06-05 DIAGNOSIS — I480 Paroxysmal atrial fibrillation: Secondary | ICD-10-CM | POA: Diagnosis present

## 2017-06-05 MED ORDER — SODIUM CHLORIDE 0.9 % IV BOLUS (SEPSIS)
1000.0000 mL | Freq: Once | INTRAVENOUS | Status: AC
  Administered 2017-06-06: 1000 mL via INTRAVENOUS

## 2017-06-05 MED ORDER — SODIUM CHLORIDE 0.9 % IV BOLUS (SEPSIS)
500.0000 mL | Freq: Once | INTRAVENOUS | Status: AC
  Administered 2017-06-06: 500 mL via INTRAVENOUS

## 2017-06-05 NOTE — ED Provider Notes (Signed)
[facility] DEPT Provider Note: Georgena Spurling, MD, FACEP  CSN: [id number] MRN: [id number] ARRIVAL: 06/05/17 at 2216 ROOM: [place]  Generalized Weakness   HISTORY OF PRESENT ILLNESS  06/05/17 10:52 PM Gary Caldera. is a 84 y.o. male a history of non-Hodgkin's lymphoma in remission per most recent PET scan April 22.  He is here with a general decline over the past 2 weeks this decline is included weight loss, lack of energy, decreased appetite and increased sleep.  He had been constipated and was recently given a laxative which caused him to have diarrhea today.  He was noticed this evening to have a temperature of 101.3 at home and was advised to go to the ED; his rectal temperature here was 100.4.  His blood pressure was noted to be 90/40.  His oxygen saturation drops into the 80s when he is asleep but comes up to 97% when awake and alert.  He was confused earlier but is oriented now.  He denies pain but EMS did note he had some left lower quadrant tenderness on their exam.  He is reportedly on Cipro, or was recently on Cipro, for an unspecified infection.   Past Medical History:  Diagnosis Date  . Anemia in chronic renal disease 05/07/2017  . Anxiety   . Atrial fibrillation ([place])   . COPD (chronic obstructive pulmonary disease) ([place])    pt. denies  . Coronary artery disease    a. h/o Overlapping stents RCA;  b. 06/2011 Cath: patent stents, nonobs dzs, NL EF.  . Diabetic peripheral neuropathy ([place])   . 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 DM (diabetes mellitus) ([place])    Type 2, peripheral neuropathy.  . Dyspnea    with exertion  . Dysrhythmia   . GERD (gastroesophageal reflux disease)   . Headache   . History of bronchitis   . History of kidney stones   . History of radiation therapy 02/19/16 - 03/13/16   Testis/Scrotum: 32.4 Gy in 18 fractions  . History of radiation therapy 08/07/16-08/20/16   left adrenal gland mass treated to 30 Gy  in 10 fractions  . Hyperlipidemia   . Hypertension   . Low testosterone   . Nephrolithiasis   . Osteoarthritis    shoulder  . Restless leg   . SVT (supraventricular tachycardia) ([place])   . Urinary frequency   . Wears partial dentures    upper and lower    Past Surgical History:  Procedure Laterality Date  . APPENDECTOMY    . [place]  . CARDIAC CATHETERIZATION  01/2013  . CATARACT EXTRACTION, BILATERAL    . CHOLECYSTECTOMY    . COLONOSCOPY    . CORONARY ANGIOPLASTY  2004  . EYE SURGERY Bilateral    cataracts  . IR GENERIC HISTORICAL  10/05/2015   IR US GUIDE VASC ACCESS RIGHT 10/05/2015 Marybelle Killings, MD [facility]  . IR GENERIC HISTORICAL  10/05/2015   IR FLUORO GUIDE PORT INSERTION RIGHT 10/05/2015 Marybelle Killings, MD [facility]  . LEFT HEART CATHETERIZATION WITH CORONARY ANGIOGRAM N/A 06/18/2011   Procedure: LEFT HEART CATHETERIZATION WITH CORONARY ANGIOGRAM;  Surgeon: Peter M Martinique, MD;  Location: [facility] CATH LAB;  Service: Cardiovascular;  Laterality: N/A;  . LEFT HEART CATHETERIZATION WITH CORONARY ANGIOGRAM N/A 01/27/2013   Procedure: LEFT HEART CATHETERIZATION WITH CORONARY ANGIOGRAM;  Surgeon: Burnell Blanks, MD;  Location: [facility] CATH LAB;  Service: Cardiovascular;  Laterality: N/A;  .  LUMBAR LAMINECTOMY/DECOMPRESSION MICRODISCECTOMY N/A 02/07/2015   Procedure: Lumbar three-Sacral one Decompression;  Surgeon: Kevan Ny Ditty, MD;  Location: [facility]URO ORS;  Service: Neurosurgery;  Laterality: N/A;  L3 to S1 Decompression  . MULTIPLE TOOTH EXTRACTIONS    . ORCHIECTOMY Right 09/01/2015   Procedure: RIGHT ORCHIECTOMY;  Surgeon: Kathie Rhodes, MD;  Location: [facility] ORS;  Service: Urology;  Laterality: Right;  . ROTATOR CUFF REPAIR Left     Family History  Problem Relation Age of Onset  . Alzheimer's disease Mother   . Heart disease Mother   . Heart disease Father   . Migraines Father   . Ulcers Father   . Prostate cancer Brother   . Coronary artery disease  Unknown        Male 1st degree relative <50  . Coronary artery disease Unknown        male 1st degree relative <60  . Heart disease Sister   . Obesity Sister        Morbid  . Arthritis Sister   . Heart disease Brother   . Arthritis Brother   . Sleep apnea Son   . Obesity Son   . Migraines Daughter   . Thyroid disease Daughter     Social History   Tobacco Use  . Smoking status: Former Smoker    Packs/day: 1.50    Years: 20.00    Pack years: 30.00    Types: Cigarettes    Last attempt to quit: 04/05/1978    Years since quitting: 39.1  . Smokeless tobacco: Never Used  Substance Use Topics  . Alcohol use: No  . Drug use: No    Prior to Admission medications   Medication Sig Start Date End Date Taking? Authorizing Provider  acetaminophen (TYLENOL) 500 MG tablet Take 1,000 mg by mouth every 6 (six) hours as needed (for pain/fever/headaches.).    Yes [provider]  atorvastatin (LIPITOR) 40 MG tablet TAKE 1 TABLET BY MOUTH EVERY DAY 05/30/17  Yes [name], Alinda Sierras, MD  ciprofloxacin (CIPRO) 500 MG tablet Take 500 mg by mouth 2 (two) times daily.   Yes [provider]  dexamethasone (DECADRON) 4 MG tablet Take 2 tablets one time a day for 2 days starting the day after chemotherapy. Take with food. 01/15/17  Yes Ennever, Rudell Cobb, MD  gabapentin (NEURONTIN) 300 MG capsule TAKE 2 CAPSULES BY MOUTH 3 TIMES A DAY 04/28/17  Yes [name], Alinda Sierras, MD  KLOR-CON M20 20 MEQ tablet TAKE 1 TABLET BY MOUTH EVERY DAY 05/19/17  Yes [name], Alinda Sierras, MD  lactulose (CHRONULAC) 10 GM/15ML solution Take 15 mLs (10 g total) by mouth every 6 (six) hours as needed for mild constipation or moderate constipation. 02/17/17  Yes [place], Holli Humbles, NP  magic mouthwash w/lidocaine SOLN Take 5 mLs by mouth 4 (four) times daily as needed for mouth pain. 1:1 ratio. DIPHENHYDRAMINE HCL (ANTIHISTAMINES - ETHANOLAMINES),ALUM & MAG HYDROXIDE-SIMETH,NYSTATIN (ANTI-INFECTIVES - THROAT),LIDOCAINE HCL  (ANESTHETICS TOPICAL ORAL) 04/01/17  Yes [place], Holli Humbles, NP  Melatonin 1 MG TABS Take 1 tablet by mouth at bedtime as needed (sleep).   Yes [provider]  metFORMIN (GLUCOPHAGE) 500 MG tablet TAKE 1 TABLET BY MOUTH TWICE A DAY WITH A MEAL 09/16/16  Yes [name], Alinda Sierras, MD  metoprolol succinate (TOPROL-XL) 100 MG 24 hr tablet TAKE 1 TABLET BY MOUTH EVERY DAY IMMEDIATELY FOLLOWING A MEAL 03/28/17  Yes [name], Alinda Sierras, MD  nitroGLYCERIN (NITROSTAT) 0.4 MG SL tablet Place 1 tablet (0.4  mg total) under the tongue every 5 (five) minutes as needed. Chest pain 01/20/14  Yes Burnell Blanks, MD  omega-3 acid ethyl esters (LOVAZA) 1 g capsule Take 1 g by mouth daily.   Yes [provider]  oxybutynin (DITROPAN XL) 15 MG 24 hr tablet Take 15 mg by mouth at bedtime.  02/14/16  Yes [provider]  pantoprazole (PROTONIX) 40 MG tablet TAKE 1 TABLET BY MOUTH EVERY DAY 12/09/16  Yes [name], Alinda Sierras, MD  polyethylene glycol powder (MIRALAX) powder Take 17g daily, may increase to 2 times daily if constipated. 06/04/17  Yes Volanda Napoleon, MD  pramipexole (MIRAPEX) 1.5 MG tablet Take two tablets every night. 09/10/16  Yes [name], Alinda Sierras, MD  tamsulosin (FLOMAX) 0.4 MG CAPS capsule Take 0.4 mg by mouth at bedtime.   Yes [provider]  TOUJEO SOLOSTAR 300 UNIT/ML SOPN INJECT 20 UNITS INTO THE SKIN DAILY AFTER BREAKFAST. 09/24/16  Yes [name], Alinda Sierras, MD  Trospium Chloride 60 MG CP24 Take 60 mg by mouth daily.   Yes [provider]  vitamin B-12 (CYANOCOBALAMIN) 1000 MCG tablet Take 1,000 mcg by mouth daily.   Yes [provider]  warfarin (COUMADIN) 5 MG tablet Take as directed by anticoagulation clinic Patient taking differently: Take by mouth as directed. 7.5 MG on Sunday, Monday, Tuesday, Thursday, Friday and Saturday 5 MG on Wednesday 05/28/17  Yes [name], Alinda Sierras, MD  ACCU-CHEK AVIVA PLUS test strip TEST 3 TIMES A DAY Patient taking  differently: TEST 2 TIMES A DAY 10/27/15   [name], Alinda Sierras, MD  clopidogrel (PLAVIX) 75 MG tablet TAKE 1 TABLET BY MOUTH DAILY Patient not taking: Reported on 06/05/2017 09/25/16   Burnell Blanks, MD  docusate sodium (COLACE) 50 MG capsule Take 1 capsule (50 mg total) by mouth 2 (two) times daily as needed (for constipation). Patient not taking: Reported on 06/05/2017 04/22/16   [name], MD  furosemide (LASIX) 40 MG tablet Take 1 tablet (40 mg total) by mouth 2 (two) times daily. Patient not taking: Reported on 06/05/2017 07/03/16   Patrecia Pour, Christean Grief, MD  ondansetron (ZOFRAN) 8 MG tablet Take 1 tablet (8 mg total) by mouth 2 (two) times daily as needed. Start on day 6 of chemotherapy. Patient not taking: Reported on 06/05/2017 01/15/17   Volanda Napoleon, MD  prochlorperazine (COMPAZINE) 10 MG tablet Take 1 tablet (10 mg total) by mouth every 6 (six) hours as needed (Nausea or vomiting). Patient not taking: Reported on 06/05/2017 01/15/17   Volanda Napoleon, MD  temazepam (RESTORIL) 30 MG capsule TAKE ONE CAPSULE AT BEDTIME AS NEEDED SLEEP Patient not taking: Reported on 06/05/2017 11/04/16   [name], MD  metoprolol (LOPRESSOR) 50 MG tablet  06/08/10 03/27/11  [provider]  spironolactone (ALDACTONE) 25 MG tablet  01/14/11 03/27/11  [provider]    Allergies Ace inhibitors; Codeine; and Penicillins   REVIEW OF SYSTEMS  Negative except as noted here or in the History of Present Illness.   PHYSICAL EXAMINATION  Initial Vital Signs Blood pressure (!) 93/49, pulse 83, temperature 98.4 F (36.9 C), temperature source Oral, resp. rate 15, SpO2 97 %.  Examination General: Well-developed, well-nourished male in no acute distress; appearance consistent with age of record HENT: normocephalic; atraumatic Eyes: pupils equal, round and reactive to light; extraocular muscles intact; bilateral pseudophakia Neck: supple Heart: regular rate and  rhythm Lungs: clear to auscultation bilaterally Abdomen: soft; nondistended; mild left lower quadrant  tenderness; no masses or hepatosplenomegaly; bowel sounds present Extremities: No deformity; full range of motion; edema of lower legs Neurologic: Awake, alert and oriented x 3; motor function intact in all extremities and symmetric; no facial droop Skin: Warm and dry Psychiatric: Normal mood and affect   RESULTS  Summary of this visit's results, reviewed by myself:   EKG Interpretation  Date/Time:  Friday Jun 06 2017 00:10:00 EDT Ventricular Rate:  81 PR Interval:    QRS Duration: 152 QT Interval:  431 QTC Calculation: 501 R Axis:   -84 Text Interpretation:  Sinus tachycardia Multiform ventricular premature complexes RBBB and LAFB Rate is slower Confirmed by Gor Vestal [phone]) on 06/06/2017 12:16:26 AM      Laboratory Studies: Results for orders placed or performed during the hospital encounter of 06/05/17 (from the past 24 hour(s))  Comprehensive metabolic panel     Status: Abnormal   Collection Time: 06/06/17 12:06 AM  Result Value Ref Range   Sodium 139 135 - 145 mmol/L   Potassium 4.2 3.5 - 5.1 mmol/L   Chloride 111 101 - 111 mmol/L   CO2 21 (L) 22 - 32 mmol/L   Glucose, Bld 138 (H) 65 - 99 mg/dL   BUN 28 (H) 6 - 20 mg/dL   Creatinine, Ser 2.22 (H) 0.61 - 1.24 mg/dL   Calcium 7.8 (L) 8.9 - 10.3 mg/dL   Total Protein 5.8 (L) 6.5 - 8.1 g/dL   Albumin 2.6 (L) 3.5 - 5.0 g/dL   AST 18 15 - 41 U/L   ALT 9 (L) 17 - 63 U/L   Alkaline Phosphatase 60 38 - 126 U/L   Total Bilirubin 0.4 0.3 - 1.2 mg/dL   GFR calc non Af Amer 26 (L) >60 mL/min   GFR calc Af Amer 30 (L) >60 mL/min   Anion gap 7 5 - 15  CBC WITH DIFFERENTIAL     Status: Abnormal   Collection Time: 06/06/17 12:06 AM  Result Value Ref Range   WBC 4.0 4.0 - 10.5 K/uL   RBC 2.72 (L) 4.22 - 5.81 MIL/uL   Hemoglobin 8.5 (L) 13.0 - 17.0 g/dL   HCT 26.1 (L) 39.0 - 52.0 %   MCV 96.0 78.0 - 100.0 fL   MCH 31.3  26.0 - 34.0 pg   MCHC 32.6 30.0 - 36.0 g/dL   RDW 15.8 (H) 11.5 - 15.5 %   Platelets 73 (L) 150 - 400 K/uL   Neutrophils Relative % 69 %   Neutro Abs 2.8 1.7 - 7.7 K/uL   Lymphocytes Relative 19 %   Lymphs Abs 0.8 0.7 - 4.0 K/uL   Monocytes Relative 11 %   Monocytes Absolute 0.4 0.1 - 1.0 K/uL   Eosinophils Relative 1 %   Eosinophils Absolute 0.0 0.0 - 0.7 K/uL   Basophils Relative 0 %   Basophils Absolute 0.0 0.0 - 0.1 K/uL  I-Stat CG4 Lactic Acid, ED  (not at  [facility])     Status: None   Collection Time: 06/06/17 12:18 AM  Result Value Ref Range   Lactic Acid, Venous 1.47 0.5 - 1.9 mmol/L  Urinalysis, Routine w reflex microscopic     Status: Abnormal   Collection Time: 06/06/17  1:50 AM  Result Value Ref Range   Color, Urine YELLOW YELLOW   APPearance TURBID (A) CLEAR   Specific Gravity, Urine 1.016 1.005 - 1.030   pH 5.0 5.0 - 8.0   Glucose, UA NEGATIVE NEGATIVE mg/dL   Hgb urine dipstick SMALL (  A) NEGATIVE   Bilirubin Urine NEGATIVE NEGATIVE   Ketones, ur NEGATIVE NEGATIVE mg/dL   Protein, ur 100 (A) NEGATIVE mg/dL   Nitrite NEGATIVE NEGATIVE   Leukocytes, UA MODERATE (A) NEGATIVE   RBC / HPF 21-50 0 - 5 RBC/hpf   WBC, UA >50 (H) 0 - 5 WBC/hpf   Bacteria, UA FEW (A) NONE SEEN   Squamous Epithelial / LPF 0-5 0 - 5   WBC Clumps PRESENT    Mucus PRESENT    *Note: Due to a large number of results and/or encounters for the requested time period, some results have not been displayed. A complete set of results can be found in Results Review.   Imaging Studies: Dg Chest 2 View  Result Date: 06/05/2017 CLINICAL DATA:  Weakness, hypotension EXAM: CHEST - 2 VIEW COMPARISON:  PET-CT dated 04/28/2017 FINDINGS: Mild left basilar scarring/atelectasis. No focal consolidation. No pleural effusion or pneumothorax. The heart is normal in size. Right chest port terminates in the lower SVC. Degenerative changes of the visualized thoracolumbar spine. IMPRESSION: No evidence of acute  cardiopulmonary disease. Electronically Signed   By: Julian Hy M.D.   On: 06/05/2017 23:40    ED COURSE and MDM  Nursing notes and initial vitals signs, including pulse oximetry, reviewed.  Vitals:   06/06/17 0130 06/06/17 0200 06/06/17 0230 06/06/17 0300  BP: 110/62 109/61 108/64 113/60  Pulse: 88 88 91 91  Resp: 15 18 16 16   Temp:      TempSrc:      SpO2: 100% 100% 100% 100%  Weight:      Height:       11:14 PM Sepsis protocol initiated.  3:34 AM Antibiotics given per pharmacy consult.  The source of his infection appears to be urinary.  He is feeling better after IV fluid bolus and is requesting something to eat.  PROCEDURES   CRITICAL CARE Performed by: Shanon Rosser L Total critical care time: 30 minutes Critical care time was exclusive of separately billable procedures and treating other patients. Critical care was necessary to treat or prevent imminent or life-threatening deterioration. Critical care was time spent personally by me on the following activities: development of treatment plan with patient and/or surrogate as well as nursing, discussions with consultants, evaluation of patient's response to treatment, examination of patient, obtaining history from patient or surrogate, ordering and performing treatments and interventions, ordering and review of laboratory studies, ordering and review of radiographic studies, pulse oximetry and re-evaluation of patient's condition.   ED DIAGNOSES     ICD-10-CM   1. Lower urinary tract infectious disease N39.0   2. Acute kidney injury ([place]) N17.9        Dalexa Gentz, Jenny Reichmann, MD 06/06/17 [phone]

## 2017-06-05 NOTE — ED Triage Notes (Signed)
Pt BIB EMS from home with complaints of generalized weakness, hypotension, RLQ abd pain and abd distention with related nausea but no vomiting x1 week. Patient states fever at home, last has tylenol at [facility]. Patient hx of cancer, but has been in remission for 50 days.

## 2017-06-05 NOTE — ED Notes (Signed)
Bed: LK44 Expected date:  Expected time:  Means of arrival:  Comments: 84 yr old weakness, nausea

## 2017-06-06 ENCOUNTER — Other Ambulatory Visit: Payer: Self-pay

## 2017-06-06 ENCOUNTER — Encounter (HOSPITAL_COMMUNITY): Payer: Self-pay | Admitting: Family Medicine

## 2017-06-06 ENCOUNTER — Inpatient Hospital Stay (HOSPITAL_COMMUNITY): Payer: Medicare Other

## 2017-06-06 DIAGNOSIS — E1165 Type 2 diabetes mellitus with hyperglycemia: Secondary | ICD-10-CM | POA: Diagnosis not present

## 2017-06-06 DIAGNOSIS — Z9861 Coronary angioplasty status: Secondary | ICD-10-CM | POA: Diagnosis not present

## 2017-06-06 DIAGNOSIS — Z9221 Personal history of antineoplastic chemotherapy: Secondary | ICD-10-CM | POA: Diagnosis not present

## 2017-06-06 DIAGNOSIS — Z9225 Personal history of immunosupression therapy: Secondary | ICD-10-CM | POA: Diagnosis not present

## 2017-06-06 DIAGNOSIS — R5381 Other malaise: Secondary | ICD-10-CM | POA: Diagnosis not present

## 2017-06-06 DIAGNOSIS — N179 Acute kidney failure, unspecified: Secondary | ICD-10-CM | POA: Diagnosis present

## 2017-06-06 DIAGNOSIS — K219 Gastro-esophageal reflux disease without esophagitis: Secondary | ICD-10-CM | POA: Diagnosis present

## 2017-06-06 DIAGNOSIS — N3281 Overactive bladder: Secondary | ICD-10-CM | POA: Diagnosis present

## 2017-06-06 DIAGNOSIS — N390 Urinary tract infection, site not specified: Secondary | ICD-10-CM | POA: Diagnosis present

## 2017-06-06 DIAGNOSIS — E1142 Type 2 diabetes mellitus with diabetic polyneuropathy: Secondary | ICD-10-CM

## 2017-06-06 DIAGNOSIS — C8589 Other specified types of non-Hodgkin lymphoma, extranodal and solid organ sites: Secondary | ICD-10-CM

## 2017-06-06 DIAGNOSIS — I480 Paroxysmal atrial fibrillation: Secondary | ICD-10-CM

## 2017-06-06 DIAGNOSIS — M7989 Other specified soft tissue disorders: Secondary | ICD-10-CM | POA: Diagnosis not present

## 2017-06-06 DIAGNOSIS — E1140 Type 2 diabetes mellitus with diabetic neuropathy, unspecified: Secondary | ICD-10-CM | POA: Diagnosis not present

## 2017-06-06 DIAGNOSIS — K5900 Constipation, unspecified: Secondary | ICD-10-CM | POA: Diagnosis present

## 2017-06-06 DIAGNOSIS — I10 Essential (primary) hypertension: Secondary | ICD-10-CM

## 2017-06-06 DIAGNOSIS — I890 Lymphedema, not elsewhere classified: Secondary | ICD-10-CM | POA: Diagnosis not present

## 2017-06-06 DIAGNOSIS — I129 Hypertensive chronic kidney disease with stage 1 through stage 4 chronic kidney disease, or unspecified chronic kidney disease: Secondary | ICD-10-CM | POA: Diagnosis present

## 2017-06-06 DIAGNOSIS — I361 Nonrheumatic tricuspid (valve) insufficiency: Secondary | ICD-10-CM | POA: Diagnosis not present

## 2017-06-06 DIAGNOSIS — E782 Mixed hyperlipidemia: Secondary | ICD-10-CM

## 2017-06-06 DIAGNOSIS — R531 Weakness: Secondary | ICD-10-CM | POA: Diagnosis not present

## 2017-06-06 DIAGNOSIS — B958 Unspecified staphylococcus as the cause of diseases classified elsewhere: Secondary | ICD-10-CM | POA: Diagnosis not present

## 2017-06-06 DIAGNOSIS — Z923 Personal history of irradiation: Secondary | ICD-10-CM | POA: Diagnosis not present

## 2017-06-06 DIAGNOSIS — I472 Ventricular tachycardia: Secondary | ICD-10-CM | POA: Diagnosis present

## 2017-06-06 DIAGNOSIS — D631 Anemia in chronic kidney disease: Secondary | ICD-10-CM | POA: Diagnosis not present

## 2017-06-06 DIAGNOSIS — C8590 Non-Hodgkin lymphoma, unspecified, unspecified site: Secondary | ICD-10-CM | POA: Diagnosis present

## 2017-06-06 DIAGNOSIS — I2510 Atherosclerotic heart disease of native coronary artery without angina pectoris: Secondary | ICD-10-CM | POA: Diagnosis present

## 2017-06-06 DIAGNOSIS — J449 Chronic obstructive pulmonary disease, unspecified: Secondary | ICD-10-CM

## 2017-06-06 DIAGNOSIS — M79609 Pain in unspecified limb: Secondary | ICD-10-CM | POA: Diagnosis not present

## 2017-06-06 DIAGNOSIS — R7989 Other specified abnormal findings of blood chemistry: Secondary | ICD-10-CM | POA: Diagnosis not present

## 2017-06-06 DIAGNOSIS — E1122 Type 2 diabetes mellitus with diabetic chronic kidney disease: Secondary | ICD-10-CM | POA: Diagnosis present

## 2017-06-06 DIAGNOSIS — A408 Other streptococcal sepsis: Secondary | ICD-10-CM | POA: Diagnosis not present

## 2017-06-06 DIAGNOSIS — E876 Hypokalemia: Secondary | ICD-10-CM | POA: Diagnosis present

## 2017-06-06 DIAGNOSIS — N182 Chronic kidney disease, stage 2 (mild): Secondary | ICD-10-CM | POA: Diagnosis present

## 2017-06-06 DIAGNOSIS — A411 Sepsis due to other specified staphylococcus: Secondary | ICD-10-CM | POA: Diagnosis present

## 2017-06-06 DIAGNOSIS — A419 Sepsis, unspecified organism: Secondary | ICD-10-CM | POA: Diagnosis not present

## 2017-06-06 DIAGNOSIS — D61818 Other pancytopenia: Secondary | ICD-10-CM | POA: Diagnosis present

## 2017-06-06 DIAGNOSIS — R791 Abnormal coagulation profile: Secondary | ICD-10-CM | POA: Diagnosis present

## 2017-06-06 DIAGNOSIS — R6520 Severe sepsis without septic shock: Secondary | ICD-10-CM | POA: Diagnosis present

## 2017-06-06 LAB — PROTIME-INR
INR: 6.91 — AB
Prothrombin Time: 59.3 seconds — ABNORMAL HIGH (ref 11.4–15.2)

## 2017-06-06 LAB — COMPREHENSIVE METABOLIC PANEL
ALBUMIN: 2.6 g/dL — AB (ref 3.5–5.0)
ALK PHOS: 60 U/L (ref 38–126)
ALT: 9 U/L — ABNORMAL LOW (ref 17–63)
ANION GAP: 7 (ref 5–15)
AST: 18 U/L (ref 15–41)
BUN: 28 mg/dL — ABNORMAL HIGH (ref 6–20)
CALCIUM: 7.8 mg/dL — AB (ref 8.9–10.3)
CO2: 21 mmol/L — ABNORMAL LOW (ref 22–32)
Chloride: 111 mmol/L (ref 101–111)
Creatinine, Ser: 2.22 mg/dL — ABNORMAL HIGH (ref 0.61–1.24)
GFR calc non Af Amer: 26 mL/min — ABNORMAL LOW (ref >60)
GFR, EST AFRICAN AMERICAN: 30 mL/min — AB (ref >60)
GLUCOSE: 138 mg/dL — AB (ref 65–99)
POTASSIUM: 4.2 mmol/L (ref 3.5–5.1)
SODIUM: 139 mmol/L (ref 135–145)
Total Bilirubin: 0.4 mg/dL (ref 0.3–1.2)
Total Protein: 5.8 g/dL — ABNORMAL LOW (ref 6.5–8.1)

## 2017-06-06 LAB — CBC WITH DIFFERENTIAL/PLATELET
Basophils Absolute: 0 10*3/uL (ref 0.0–0.1)
Basophils Relative: 0 %
Eosinophils Absolute: 0 10*3/uL (ref 0.0–0.7)
Eosinophils Relative: 1 %
HEMATOCRIT: 26.1 % — AB (ref 39.0–52.0)
HEMOGLOBIN: 8.5 g/dL — AB (ref 13.0–17.0)
Lymphocytes Relative: 19 %
Lymphs Abs: 0.8 10*3/uL (ref 0.7–4.0)
MCH: 31.3 pg (ref 26.0–34.0)
MCHC: 32.6 g/dL (ref 30.0–36.0)
MCV: 96 fL (ref 78.0–100.0)
MONO ABS: 0.4 10*3/uL (ref 0.1–1.0)
Monocytes Relative: 11 %
NEUTROS ABS: 2.8 10*3/uL (ref 1.7–7.7)
NEUTROS PCT: 69 %
Platelets: 73 10*3/uL — ABNORMAL LOW (ref 150–400)
RBC: 2.72 MIL/uL — ABNORMAL LOW (ref 4.22–5.81)
RDW: 15.8 % — AB (ref 11.5–15.5)
WBC: 4 10*3/uL (ref 4.0–10.5)

## 2017-06-06 LAB — URINALYSIS, ROUTINE W REFLEX MICROSCOPIC
Bilirubin Urine: NEGATIVE
GLUCOSE, UA: NEGATIVE mg/dL
KETONES UR: NEGATIVE mg/dL
Nitrite: NEGATIVE
PROTEIN: 100 mg/dL — AB
Specific Gravity, Urine: 1.016 (ref 1.005–1.030)
pH: 5 (ref 5.0–8.0)

## 2017-06-06 LAB — CBC
HCT: 25.6 % — ABNORMAL LOW (ref 39.0–52.0)
Hemoglobin: 8.4 g/dL — ABNORMAL LOW (ref 13.0–17.0)
MCH: 31.7 pg (ref 26.0–34.0)
MCHC: 32.8 g/dL (ref 30.0–36.0)
MCV: 96.6 fL (ref 78.0–100.0)
Platelets: 68 10*3/uL — ABNORMAL LOW (ref 150–400)
RBC: 2.65 MIL/uL — ABNORMAL LOW (ref 4.22–5.81)
RDW: 16 % — AB (ref 11.5–15.5)
WBC: 4.1 10*3/uL (ref 4.0–10.5)

## 2017-06-06 LAB — GLUCOSE, CAPILLARY
GLUCOSE-CAPILLARY: 118 mg/dL — AB (ref 65–99)
GLUCOSE-CAPILLARY: 117 mg/dL — AB (ref 65–99)
Glucose-Capillary: 108 mg/dL — ABNORMAL HIGH (ref 65–99)

## 2017-06-06 LAB — I-STAT CG4 LACTIC ACID, ED: Lactic Acid, Venous: 1.47 mmol/L (ref 0.5–1.9)

## 2017-06-06 MED ORDER — POLYETHYLENE GLYCOL 3350 17 G PO PACK
17.0000 g | PACK | Freq: Every day | ORAL | Status: DC | PRN
  Administered 2017-06-07: 17 g via ORAL
  Filled 2017-06-06: qty 1

## 2017-06-06 MED ORDER — SODIUM CHLORIDE 0.9% FLUSH
10.0000 mL | INTRAVENOUS | Status: DC | PRN
  Administered 2017-06-06 – 2017-06-10 (×3): 10 mL
  Filled 2017-06-06 (×3): qty 40

## 2017-06-06 MED ORDER — SODIUM CHLORIDE 0.9 % IV SOLN
2.0000 g | INTRAVENOUS | Status: AC
  Administered 2017-06-06: 2 g via INTRAVENOUS
  Filled 2017-06-06: qty 2

## 2017-06-06 MED ORDER — MAGIC MOUTHWASH
5.0000 mL | Freq: Four times a day (QID) | ORAL | Status: DC | PRN
  Administered 2017-06-06 – 2017-06-10 (×8): 5 mL via ORAL
  Filled 2017-06-06 (×11): qty 5

## 2017-06-06 MED ORDER — LEVOFLOXACIN IN D5W 750 MG/150ML IV SOLN
750.0000 mg | Freq: Once | INTRAVENOUS | Status: AC
  Administered 2017-06-06: 750 mg via INTRAVENOUS
  Filled 2017-06-06: qty 150

## 2017-06-06 MED ORDER — VITAMIN B-12 1000 MCG PO TABS
1000.0000 ug | ORAL_TABLET | Freq: Every day | ORAL | Status: DC
  Administered 2017-06-06 – 2017-06-12 (×7): 1000 ug via ORAL
  Filled 2017-06-06 (×7): qty 1

## 2017-06-06 MED ORDER — OMEGA-3-ACID ETHYL ESTERS 1 G PO CAPS
1.0000 g | ORAL_CAPSULE | Freq: Every day | ORAL | Status: DC
  Administered 2017-06-06 – 2017-06-12 (×7): 1 g via ORAL
  Filled 2017-06-06 (×7): qty 1

## 2017-06-06 MED ORDER — VANCOMYCIN HCL 10 G IV SOLR
2000.0000 mg | INTRAVENOUS | Status: AC
  Administered 2017-06-06: 2000 mg via INTRAVENOUS
  Filled 2017-06-06: qty 2000

## 2017-06-06 MED ORDER — SODIUM CHLORIDE 0.9 % IV SOLN
2.0000 g | Freq: Every day | INTRAVENOUS | Status: DC
  Administered 2017-06-06 – 2017-06-08 (×3): 2 g via INTRAVENOUS
  Filled 2017-06-06 (×3): qty 2

## 2017-06-06 MED ORDER — PRAMIPEXOLE DIHYDROCHLORIDE 1 MG PO TABS
3.0000 mg | ORAL_TABLET | Freq: Every day | ORAL | Status: DC
  Administered 2017-06-06 – 2017-06-11 (×6): 3 mg via ORAL
  Filled 2017-06-06 (×6): qty 3

## 2017-06-06 MED ORDER — ACETAMINOPHEN 650 MG RE SUPP: 650.0000 mg | Freq: Four times a day (QID) | RECTAL | Status: DC | PRN

## 2017-06-06 MED ORDER — ACETAMINOPHEN 325 MG PO TABS
650.0000 mg | ORAL_TABLET | Freq: Four times a day (QID) | ORAL | Status: DC | PRN
  Administered 2017-06-06 – 2017-06-12 (×10): 650 mg via ORAL
  Filled 2017-06-06 (×10): qty 2

## 2017-06-06 MED ORDER — GABAPENTIN 100 MG PO CAPS
200.0000 mg | ORAL_CAPSULE | Freq: Three times a day (TID) | ORAL | Status: DC
  Administered 2017-06-06 – 2017-06-11 (×16): 200 mg via ORAL
  Filled 2017-06-06 (×16): qty 2

## 2017-06-06 MED ORDER — TAMSULOSIN HCL 0.4 MG PO CAPS
0.4000 mg | ORAL_CAPSULE | Freq: Every day | ORAL | Status: DC
  Administered 2017-06-06 – 2017-06-11 (×6): 0.4 mg via ORAL
  Filled 2017-06-06 (×6): qty 1

## 2017-06-06 MED ORDER — INSULIN GLARGINE 100 UNIT/ML ~~LOC~~ SOLN
20.0000 [IU] | Freq: Every day | SUBCUTANEOUS | Status: DC
  Administered 2017-06-06 – 2017-06-07 (×2): 20 [IU] via SUBCUTANEOUS
  Filled 2017-06-06 (×3): qty 0.2

## 2017-06-06 MED ORDER — INSULIN ASPART 100 UNIT/ML ~~LOC~~ SOLN
0.0000 [IU] | Freq: Three times a day (TID) | SUBCUTANEOUS | Status: DC
  Administered 2017-06-07: 1 [IU] via SUBCUTANEOUS
  Administered 2017-06-10 (×2): 2 [IU] via SUBCUTANEOUS
  Administered 2017-06-11: 1 [IU] via SUBCUTANEOUS
  Administered 2017-06-11: 2 [IU] via SUBCUTANEOUS
  Administered 2017-06-12: 1 [IU] via SUBCUTANEOUS

## 2017-06-06 MED ORDER — ATORVASTATIN CALCIUM 40 MG PO TABS
40.0000 mg | ORAL_TABLET | Freq: Every day | ORAL | Status: DC
  Administered 2017-06-06 – 2017-06-12 (×7): 40 mg via ORAL
  Filled 2017-06-06 (×7): qty 1

## 2017-06-06 MED ORDER — PANTOPRAZOLE SODIUM 40 MG PO TBEC
40.0000 mg | DELAYED_RELEASE_TABLET | Freq: Every day | ORAL | Status: DC
  Administered 2017-06-06 – 2017-06-12 (×7): 40 mg via ORAL
  Filled 2017-06-06 (×7): qty 1

## 2017-06-06 MED ORDER — METOPROLOL SUCCINATE ER 100 MG PO TB24
100.0000 mg | ORAL_TABLET | Freq: Every day | ORAL | Status: DC
  Administered 2017-06-06 – 2017-06-12 (×6): 100 mg via ORAL
  Filled 2017-06-06 (×7): qty 1

## 2017-06-06 MED ORDER — INSULIN GLARGINE 300 UNIT/ML ~~LOC~~ SOPN: 20.0000 [IU] | PEN_INJECTOR | Freq: Every day | SUBCUTANEOUS | Status: DC

## 2017-06-06 MED ORDER — MELATONIN 1 MG PO TABS
1.0000 | ORAL_TABLET | Freq: Every evening | ORAL | Status: DC | PRN
  Administered 2017-06-06 – 2017-06-11 (×6): 1 mg via ORAL
  Filled 2017-06-06 (×6): qty 1

## 2017-06-06 NOTE — Consult Note (Signed)
Urology Consult  Referring physician: Dr. Lonny Prude Reason for referral: urinary retention, difficult foley placement   Chief Complaint: suprapubic pain  History of Present Illness: Gary Yates is a 84yo with a history testicular lymphoma and BPH admitted with fevers and constipation. He is followed by Dr. Karsten Ro for his hx of testis cancer and BPH. He is currently on flomax 0.42m daily. 3 days ago he developed low grade fevers but presented to the ER this morning. UA in the ER is concerning for infection. The patient was only able to void small amount since admission and was bladder scanned for over 800cc. Currently he has sever, sharp, constant, nonradiating suprapubic pain. No alleviating/exacerbating events. No other associated symptoms. An attempt was made to perform CIC which was unsuccessful.   Past Medical History:  Diagnosis Date  . Anemia in chronic renal disease 05/07/2017  . Anxiety   . Atrial fibrillation ([facility]   . COPD (chronic obstructive pulmonary disease) ([facility]    pt. denies  . Coronary artery disease    a. h/o Overlapping stents RCA;  b. 06/2011 Cath: patent stents, nonobs dzs, NL EF.  . Diabetic peripheral neuropathy ([facility]   . Diffuse non-Hodgkin's lymphoma of testis ([place] 09/28/2015  . DM (diabetes mellitus) ([place]    Type 2, peripheral neuropathy.  . Dyspnea    with exertion  . Dysrhythmia   . GERD (gastroesophageal reflux disease)   . Headache   . History of bronchitis   . History of kidney stones   . History of radiation therapy 02/19/16 - 03/13/16   Testis/Scrotum: 32.4 Gy in 18 fractions  . History of radiation therapy 08/07/16-08/20/16   left adrenal gland mass treated to 30 Gy in 10 fractions  . Hyperlipidemia   . Hypertension   . Low testosterone   . Nephrolithiasis   . Osteoarthritis    shoulder  . Restless leg   . SVT (supraventricular tachycardia) ([facility] [place]   . Urinary frequency   . Wears partial dentures    upper and lower   Past Surgical History:  Procedure  Laterality Date  . APPENDECTOMY    . BRosalie . CARDIAC CATHETERIZATION  01/2013  . CATARACT EXTRACTION, BILATERAL    . CHOLECYSTECTOMY    . COLONOSCOPY    . CORONARY ANGIOPLASTY  2004  . EYE SURGERY Bilateral    cataracts  . IR GENERIC HISTORICAL  10/05/2015   [place] RIGHT 10/05/2015 AMarybelle Killings MD [facility]  . IR GENERIC HISTORICAL  10/05/2015   IR FLUORO GUIDE PORT INSERTION RIGHT 10/05/2015 AMarybelle Killings MD [facility]  . LEFT HEART CATHETERIZATION WITH CORONARY ANGIOGRAM N/A 06/18/2011   Procedure: LEFT HEART CATHETERIZATION WITH CORONARY ANGIOGRAM;  Surgeon: Peter M JMartinique MD;  [facility];  Service: Cardiovascular;  Laterality: N/A;  . LEFT HEART CATHETERIZATION WITH CORONARY ANGIOGRAM N/A 01/27/2013   Procedure: LEFT HEART CATHETERIZATION WITH CORONARY ANGIOGRAM;  Surgeon: CBurnell Blanks MD;  [facility];  Service: Cardiovascular;  Laterality: N/A;  . LUMBAR LAMINECTOMY/DECOMPRESSION MICRODISCECTOMY N/A 02/07/2015   Procedure: Lumbar three-Sacral one Decompression;  Surgeon: BKevan NyDitty, MD;  [facility];  Service: Neurosurgery;  Laterality: N/A;  L3 to S1 Decompression  . MULTIPLE TOOTH EXTRACTIONS    . ORCHIECTOMY Right 09/01/2015   Procedure: RIGHT ORCHIECTOMY;  Surgeon: MKathie Rhodes MD;  Location: [facility];  Service: Urology;  Laterality: Right;  . ROTATOR CUFF REPAIR Left  Medications: I have reviewed the patient's current medications. Allergies:  Allergies  Allergen Reactions  . Ace Inhibitors Other (See Comments)    cough  . Codeine Nausea Only and Rash       . Penicillins Rash    Childhood allergy Has patient had a PCN reaction causing immediate rash, facial/tongue/throat swelling, SOB or lightheadedness with hypotension: Yes Has patient had a PCN reaction causing severe rash involving mucus membranes or skin necrosis: Yes Has patient had a PCN reaction that required  hospitalization No Has patient had a PCN reaction occurring within the last 10 years: No If all of the above answers are "NO", then may proceed with Cephalosporin use.     Family History  Problem Relation Age of Onset  . Alzheimer's disease Mother   . Heart disease Mother   . Heart disease Father   . Migraines Father   . Ulcers Father   . Prostate cancer Brother   . Coronary artery disease Unknown        Male 1st degree relative <50  . Coronary artery disease Unknown        male 1st degree relative <60  . Heart disease Sister   . Obesity Sister        Morbid  . Arthritis Sister   . Heart disease Brother   . Arthritis Brother   . Sleep apnea Son   . Obesity Son   . Migraines Daughter   . Thyroid disease Daughter    Social History:  reports that he quit smoking about 39 years ago. His smoking use included cigarettes. He has a 30.00 pack-year smoking history. He has never used smokeless tobacco. He reports that he does not drink alcohol or use drugs.  Review of Systems  Genitourinary: Positive for dysuria, frequency, hematuria and urgency.  All other systems reviewed and are negative.   Physical Exam:  Vital signs in last 24 hours: Temp:  [98.4 F (36.9 C)-100.4 F (38 C)] 98.6 F (37 C) (05/31 1651) Pulse Rate:  [78-109] 93 (05/31 1402) Resp:  [14-20] 18 (05/31 1402) BP: (92-129)/(49-80) 128/77 (05/31 1402) SpO2:  [95 %-100 %] 98 % (05/31 1402) Weight:  [102.5 kg (226 lb)-106 kg (233 lb 11 oz)] 106 kg (233 lb 11 oz) (05/31 0300) Physical Exam  Constitutional: He is oriented to person, place, and time. He appears well-developed and well-nourished.  HENT:  Head: Normocephalic and atraumatic.  Eyes: Pupils are equal, round, and reactive to light. EOM are normal.  Neck: Normal range of motion. No thyromegaly present.  Cardiovascular: Normal rate and regular rhythm.  Respiratory: Effort normal and breath sounds normal.  GI: Soft. He exhibits no distension and no  mass. There is tenderness. There is no rebound and no guarding. Hernia confirmed negative in the left inguinal area.  Genitourinary: Left testis shows no mass, no swelling and no tenderness. Left testis is descended. Cremasteric reflex is not absent on the left side. Uncircumcised. No phimosis.  Musculoskeletal: Normal range of motion. He exhibits no edema.  Lymphadenopathy:       Left: No inguinal adenopathy present.  Neurological: He is alert and oriented to person, place, and time.  Skin: Skin is warm and dry.  Psychiatric: He has a normal mood and affect. His behavior is normal. Judgment and thought content normal.    Laboratory Data:  Results for orders placed or performed during the hospital encounter of 06/05/17 (from the past 72 hour(s))  Comprehensive metabolic panel  Status: Abnormal   Collection Time: 06/06/17 12:06 AM  Result Value Ref Range   Sodium 139 135 - 145 mmol/L   Potassium 4.2 3.5 - 5.1 mmol/L   Chloride 111 101 - 111 mmol/L   CO2 21 (L) 22 - 32 mmol/L   Glucose, Bld 138 (H) 65 - 99 mg/dL   BUN 28 (H) 6 - 20 mg/dL   Creatinine, Ser 2.22 (H) 0.61 - 1.24 mg/dL   Calcium 7.8 (L) 8.9 - 10.3 mg/dL   Total Protein 5.8 (L) 6.5 - 8.1 g/dL   Albumin 2.6 (L) 3.5 - 5.0 g/dL   AST 18 15 - 41 U/L   ALT 9 (L) 17 - 63 U/L   Alkaline Phosphatase 60 38 - 126 U/L   Total Bilirubin 0.4 0.3 - 1.2 mg/dL   GFR calc non Af Amer 26 (L) >60 mL/min   GFR calc Af Amer 30 (L) >60 mL/min    Comment: (NOTE) The eGFR has been calculated using the CKD EPI equation. This calculation has not been validated in all clinical situations. eGFR's persistently <60 mL/min signify possible Chronic Kidney Disease.    Anion gap 7 5 - 15    Comment: Performed at [facility], [place] [address] [place], [place] [address]  CBC WITH DIFFERENTIAL     Status: Abnormal   Collection Time: 06/06/17 12:06 AM  Result Value Ref Range   WBC 4.0 4.0 - 10.5 K/uL   RBC 2.72 (L) 4.22 - 5.81  MIL/uL   Hemoglobin 8.5 (L) 13.0 - 17.0 g/dL   HCT 26.1 (L) 39.0 - 52.0 %   MCV 96.0 78.0 - 100.0 fL   MCH 31.3 26.0 - 34.0 pg   MCHC 32.6 30.0 - 36.0 g/dL   RDW 15.8 (H) 11.5 - 15.5 %   Platelets 73 (L) 150 - 400 K/uL    Comment: REPEATED TO VERIFY SPECIMEN CHECKED FOR CLOTS PLATELET COUNT CONFIRMED BY SMEAR    Neutrophils Relative % 69 %   Neutro Abs 2.8 1.7 - 7.7 K/uL   Lymphocytes Relative 19 %   Lymphs Abs 0.8 0.7 - 4.0 K/uL   Monocytes Relative 11 %   Monocytes Absolute 0.4 0.1 - 1.0 K/uL   Eosinophils Relative 1 %   Eosinophils Absolute 0.0 0.0 - 0.7 K/uL   Basophils Relative 0 %   Basophils Absolute 0.0 0.0 - 0.1 K/uL    Comment: Performed at [facility], [place] [address], [place], [place] [address]  I-Stat CG4 Lactic Acid, ED  (not at  [facility])     Status: None   Collection Time: 06/06/17 12:18 AM  Result Value Ref Range   Lactic Acid, Venous 1.47 0.5 - 1.9 mmol/L  Urinalysis, Routine w reflex microscopic     Status: Abnormal   Collection Time: 06/06/17  1:50 AM  Result Value Ref Range   Color, Urine YELLOW YELLOW   APPearance TURBID (A) CLEAR   Specific Gravity, Urine 1.016 1.005 - 1.030   pH 5.0 5.0 - 8.0   Glucose, UA NEGATIVE NEGATIVE mg/dL   Hgb urine dipstick SMALL (A) NEGATIVE   Bilirubin Urine NEGATIVE NEGATIVE   Ketones, ur NEGATIVE NEGATIVE mg/dL   Protein, ur 100 (A) NEGATIVE mg/dL   Nitrite NEGATIVE NEGATIVE   Leukocytes, UA MODERATE (A) NEGATIVE   RBC / HPF 21-50 0 - 5 RBC/hpf   WBC, UA >50 (H) 0 - 5 WBC/hpf   Bacteria, UA FEW (A) NONE SEEN  Squamous Epithelial / LPF 0-5 0 - 5   WBC Clumps PRESENT    Mucus PRESENT     Comment: Performed at [facility], [place] [address], [place], [place] [address]  Glucose, capillary     Status: Abnormal   Collection Time: 06/06/17  9:35 AM  Result Value Ref Range   Glucose-Capillary 118 (H) 65 - 99 mg/dL  Glucose, capillary     Status: Abnormal   Collection Time: 06/06/17  11:47 AM  Result Value Ref Range   Glucose-Capillary 117 (H) 65 - 99 mg/dL  Protime-INR     Status: Abnormal   Collection Time: 06/06/17  2:53 PM  Result Value Ref Range   Prothrombin Time 59.3 (H) 11.4 - 15.2 seconds   INR 6.91 (HH)     Comment: REPEATED TO VERIFY CRITICAL RESULT CALLED TO, READ BACK BY AND VERIFIED WITH: B.HOLT AT 1623 ON 06/06/17 BY N.THOMPSON Performed at [facility], [place] [address], [place], [place] [address]   Glucose, capillary     Status: Abnormal   Collection Time: 06/06/17  4:47 PM  Result Value Ref Range   Glucose-Capillary 108 (H) 65 - 99 mg/dL   *Note: Due to a large number of results and/or encounters for the requested time period, some results have not been displayed. A complete set of results can be found in Results Review.   No results found for this or any previous visit (from the past 240 hour(s)). Creatinine: Recent Labs    06/06/17 0006  CREATININE 2.22*   Baseline Creatinine: 2  Impression/Assessment:  84yo with BPH with Urinary retention, UTI  Plan:  1. 20 French coude catheter placed without incident and 1200cc of clear urine drained. Please continue foley catheter to straight drain. The patient should be discharged home witht he catheter and followup with [facility] in 1 week for a voiding trial.  2. UTI: Please continue broad spectrum antibiotics pending the urine culture. He should be treated with a total of 10 days of culture specific antibiotics  Gary Yates 06/06/2017, 8:43 PM

## 2017-06-06 NOTE — H&P (Addendum)
History and Physical    Gary Yates. WEX:[id number] DOB: 07-16-1933 DOA: 06/05/2017  PCP: Gary Post, MD Patient coming from: Home  Chief Complaint: Constipation  HPI: Gary Yates. is a 84 y.o. male with medical history significant of paroxysmal atrial fibrillation, COPD, CAD, diabetes, non-Hodgkin's lymphoma status Yates chemo and radiation therapy, hyperlipidemia, hypertension, GERD.  Patient presented to the emergency department secondary to worsening constipation.  He has an issue with intermittent constipation for the last few months.  He takes laxatives which have helped him have a bowel movement.  He also reports some worsening dysuria.  Last night he was not feeling well and his  temperature was checked rectally at home temperature of 100 and was significant for an 101 F.  He has been taking Tylenol which is not helped with his symptoms.  He has associated hesitancy and urgency.  He has a history of overactive bladder for which he takes medication.  He reports no history of enlarged prostate but does take tamsulosin.  ED Course: Vitals: Febrile to 100.4 F, slightly tachycardic to 109 bpm, normal respirations, on room air,  hypotensive initially but is now normotensive Labs: Creatinine of 2.22, BUN of 28, glucose of 138, hemoglobin of 8.5, platelets of 73, lactic acid of 1.47, urinalysis significant for few bacteria with significant RBCs and significant pyuria Imaging: Chest x-ray significant for no cardiopulmonary disease Medications/Course: Aztreonam and normal saline boluses  Review of Systems: Review of Systems  Constitutional: Positive for fever and malaise/fatigue.  Gastrointestinal: Positive for abdominal pain and constipation. Negative for blood in stool, diarrhea, melena, nausea and vomiting.  Genitourinary: Positive for dysuria, frequency and urgency. Negative for flank pain and hematuria.  Neurological: Positive for weakness.  All other systems reviewed and  are negative.   Past Medical History:  Diagnosis Date  . Anemia in chronic renal disease 05/07/2017  . Anxiety   . Atrial fibrillation ([place])   . COPD (chronic obstructive pulmonary disease) ([place])    pt. denies  . Coronary artery disease    a. h/o Overlapping stents RCA;  b. 06/2011 Cath: patent stents, nonobs dzs, NL EF.  . Diabetic peripheral neuropathy ([place])   . Diffuse non-Hodgkin's lymphoma of testis ([place]) 09/28/2015  . DM (diabetes mellitus) ([place])    Type 2, peripheral neuropathy.  . Dyspnea    with exertion  . Dysrhythmia   . GERD (gastroesophageal reflux disease)   . Headache   . History of bronchitis   . History of kidney stones   . History of radiation therapy 02/19/16 - 03/13/16   Testis/Scrotum: 32.4 Gy in 18 fractions  . History of radiation therapy 08/07/16-08/20/16   left adrenal gland mass treated to 30 Gy in 10 fractions  . Hyperlipidemia   . Hypertension   . Low testosterone   . Nephrolithiasis   . Osteoarthritis    shoulder  . Restless leg   . SVT (supraventricular tachycardia) ([place])   . Urinary frequency   . Wears partial dentures    upper and lower    Past Surgical History:  Procedure Laterality Date  . APPENDECTOMY    . [place]  . CARDIAC CATHETERIZATION  01/2013  . CATARACT EXTRACTION, BILATERAL    . CHOLECYSTECTOMY    . COLONOSCOPY    . CORONARY ANGIOPLASTY  2004  . EYE SURGERY Bilateral    cataracts  . IR GENERIC HISTORICAL  10/05/2015   IR US GUIDE VASC ACCESS RIGHT 10/05/2015  Marybelle Killings, MD [facility]-INTER[facility] RAD  . IR GENERIC HISTORICAL  10/05/2015   IR FLUORO GUIDE PORT INSERTION RIGHT 10/05/2015 Marybelle Killings, MD [facility]  . LEFT HEART CATHETERIZATION WITH CORONARY ANGIOGRAM N/A 06/18/2011   Procedure: LEFT HEART CATHETERIZATION WITH CORONARY ANGIOGRAM;  Surgeon: Peter M Martinique, MD;  Location[facility] C[facility] LAB;  Service: Cardiovascular;  Laterality: N/A;  . LEFT HEART CATHETERIZATION WITH CORONARY ANGIOGRAM N/A 01/27/2013   Procedure: LEFT  HEART CATHETERIZATION WITH CORONARY ANGIOGRAM;  Surgeon: Burnell Blanks, MD;  [facility] C[facility];  Service: Cardiovascular;  Laterality: N/A;  . LUMBAR LAMINECTOMY/DECOMPRESSION MICRODISCECTOMY N/A 02/07/2015   Procedure: Lumbar three-Sacral one Decompression;  Surgeon: Kevan Ny Ditty, MD;  Location: [place] NEURO ORS;  Service: Neurosurgery;  Laterality: N/A;  L3 to S1 Decompression  . MULTIPLE TOOTH EXTRACTIONS    . ORCHIECTOMY Right 09/01/2015   Procedure: RIGHT ORCHIECTOMY;  Surgeon: Kathie Rhodes, MD;  Location: [facility] ORS;  Service: Urology;  Laterality: Right;  . ROTATOR CUFF REPAIR Left      reports that he quit smoking about 39 years ago. His smoking use included cigarettes. He has a 30.00 pack-year smoking history. He has never used smokeless tobacco. He reports that he does not drink alcohol or use drugs.  Allergies  Allergen Reactions  . Ace Inhibitors Other (See Comments)    cough  . Codeine Nausea Only and Rash       . Penicillins Rash    Childhood allergy Has patient had a PCN reaction causing immediate rash, facial/tongue/throat swelling, SOB or lightheadedness with hypotension: Yes Has patient had a PCN reaction causing severe rash involving mucus membranes or skin necrosis: Yes Has patient had a PCN reaction that required hospitalization No Has patient had a PCN reaction occurring within the last 10 years: No If all of the above answers are "NO", then may proceed with Cephalosporin use.     Family History  Problem Relation Age of Onset  . Alzheimer's disease Mother   . Heart disease Mother   . Heart disease Father   . Migraines Father   . Ulcers Father   . Prostate cancer Brother   . Coronary artery disease Unknown        Male 1st degree relative <50  . Coronary artery disease Unknown        male 1st degree relative <60  . Heart disease Sister   . Obesity Sister        Morbid  . Arthritis Sister   . Heart disease Brother   . Arthritis Brother   .  Sleep apnea Son   . Obesity Son   . Migraines Daughter   . Thyroid disease Daughter     Prior to Admission medications   Medication Sig Start Date End Date Taking? Authorizing Provider  acetaminophen (TYLENOL) 500 MG tablet Take 1,000 mg by mouth every 6 (six) hours as needed (for pain/fever/headaches.).    Yes [provider]  atorvastatin (LIPITOR) 40 MG tablet TAKE 1 TABLET BY MOUTH EVERY DAY 05/30/17  Yes [name], Alinda Sierras, MD  ciprofloxacin (CIPRO) 500 MG tablet Take 500 mg by mouth 2 (two) times daily.   Yes [provider]  dexamethasone (DECADRON) 4 MG tablet Take 2 tablets one time a day for 2 days starting the day after chemotherapy. Take with food. 01/15/17  Yes Volanda Napoleon, MD  gabapentin (NEURONTIN) 300 MG capsule TAKE 2 CAPSULES BY MOUTH 3 TIMES A DAY 04/28/17  Yes [name], Alinda Sierras, MD  KLOR-CON M20 20 MEQ tablet TAKE 1 TABLET BY MOUTH EVERY DAY 05/19/17  Yes [name], Alinda Sierras, MD  lactulose (CHRONULAC) 10 GM/15ML solution Take 15 mLs (10 g total) by mouth every 6 (six) hours as needed for mild constipation or moderate constipation. 02/17/17  Yes [place], Holli Humbles, NP  magic mouthwash w/lidocaine SOLN Take 5 mLs by mouth 4 (four) times daily as needed for mouth pain. 1:1 ratio. DIPHENHYDRAMINE HCL (ANTIHISTAMINES - ETHANOLAMINES),ALUM & MAG HYDROXIDE-SIMETH,NYSTATIN (ANTI-INFECTIVES - THROAT),LIDOCAINE HCL (ANESTHETICS TOPICAL ORAL) 04/01/17  Yes [place], Holli Humbles, NP  Melatonin 1 MG TABS Take 1 tablet by mouth at bedtime as needed (sleep).   Yes [provider]  metFORMIN (GLUCOPHAGE) 500 MG tablet TAKE 1 TABLET BY MOUTH TWICE A DAY WITH A MEAL 09/16/16  Yes [name], Alinda Sierras, MD  metoprolol succinate (TOPROL-XL) 100 MG 24 hr tablet TAKE 1 TABLET BY MOUTH EVERY DAY IMMEDIATELY FOLLOWING A MEAL 03/28/17  Yes [name], Alinda Sierras, MD  nitroGLYCERIN (NITROSTAT) 0.4 MG SL tablet Place 1 tablet (0.4 mg total) under the tongue every 5 (five) minutes as  needed. Chest pain 01/20/14  Yes Burnell Blanks, MD  omega-3 acid ethyl esters (LOVAZA) 1 g capsule Take 1 g by mouth daily.   Yes [provider]  oxybutynin (DITROPAN XL) 15 MG 24 hr tablet Take 15 mg by mouth at bedtime.  02/14/16  Yes [provider]  pantoprazole (PROTONIX) 40 MG tablet TAKE 1 TABLET BY MOUTH EVERY DAY 12/09/16  Yes [name], Alinda Sierras, MD  polyethylene glycol powder (MIRALAX) powder Take 17g daily, may increase to 2 times daily if constipated. 06/04/17  Yes Volanda Napoleon, MD  pramipexole (MIRAPEX) 1.5 MG tablet Take two tablets every night. 09/10/16  Yes [name], Alinda Sierras, MD  tamsulosin (FLOMAX) 0.4 MG CAPS capsule Take 0.4 mg by mouth at bedtime.   Yes [provider]  TOUJEO SOLOSTAR 300 UNIT/ML SOPN INJECT 20 UNITS INTO THE SKIN DAILY AFTER BREAKFAST. 09/24/16  Yes [name], Alinda Sierras, MD  Trospium Chloride 60 MG CP24 Take 60 mg by mouth daily.   Yes [provider]  vitamin B-12 (CYANOCOBALAMIN) 1000 MCG tablet Take 1,000 mcg by mouth daily.   Yes [provider]  warfarin (COUMADIN) 5 MG tablet Take as directed by anticoagulation clinic Patient taking differently: Take by mouth as directed. 7.5 MG on Sunday, Monday, Tuesday, Thursday, Friday and Saturday 5 MG on Wednesday 05/28/17  Yes [name], Alinda Sierras, MD  ACCU-CHEK AVIVA PLUS test strip TEST 3 TIMES A DAY Patient taking differently: TEST 2 TIMES A DAY 10/27/15   [name], Alinda Sierras, MD  clopidogrel (PLAVIX) 75 MG tablet TAKE 1 TABLET BY MOUTH DAILY Patient not taking: Reported on 06/05/2017 09/25/16   Burnell Blanks, MD  docusate sodium (COLACE) 50 MG capsule Take 1 capsule (50 mg total) by mouth 2 (two) times daily as needed (for constipation). Patient not taking: Reported on 06/05/2017 04/22/16   Gary Post, MD  furosemide (LASIX) 40 MG tablet Take 1 tablet (40 mg total) by mouth 2 (two) times daily. Patient not taking: Reported on 06/05/2017 07/03/16    Patrecia Pour, Christean Grief, MD  ondansetron (ZOFRAN) 8 MG tablet Take 1 tablet (8 mg total) by mouth 2 (two) times daily as needed. Start on day 6 of chemotherapy. Patient not taking: Reported on 06/05/2017 01/15/17   Volanda Napoleon, MD  prochlorperazine (COMPAZINE) 10 MG tablet Take 1 tablet (10 mg total) by mouth every 6 (six)  hours as needed (Nausea or vomiting). Patient not taking: Reported on 06/05/2017 01/15/17   Volanda Napoleon, MD  temazepam (RESTORIL) 30 MG capsule TAKE ONE CAPSULE AT BEDTIME AS NEEDED SLEEP Patient not taking: Reported on 06/05/2017 11/04/16   Gary Post, MD  metoprolol (LOPRESSOR) 50 MG tablet  06/08/10 03/27/11  [provider]  spironolactone (ALDACTONE) 25 MG tablet  01/14/11 03/27/11  [provider]    Physical Exam: Vitals:   06/06/17 0430 06/06/17 0500 06/06/17 0530 06/06/17 0635  BP: 116/80 129/65 126/69 123/69  Pulse: (!) 109 (!) 101 100 99  Resp: 20 19 18 20   Temp:    99.6 F (37.6 C)  TempSrc:    Oral  SpO2: 100% 100% 99% 99%  Weight:    106 kg (233 lb 11 oz)  Height:    5\' 10"  (1.778 m)     Constitutional: NAD, calm, comfortable Eyes: PERRL, lids and conjunctivae normal ENMT: Mucous membranes are moist. Posterior pharynx clear of any exudate or lesions. Very poor dentition with broken teeth Neck: normal, supple, no masses, no thyromegaly Respiratory: Mild bibasilar rales to auscultation bilaterally, no wheezing. Normal respiratory effort. No accessory muscle use.  Cardiovascular: Regular rate and rhythm, no murmurs / rubs / gallops. No extremity edema. 2+ pedal pulses. No carotid bruits.  Abdomen: suprapubic tenderness with guarding. Bladder palpated and is enlarged. No hepatosplenomegaly. Bowel sounds positive.  Musculoskeletal: no clubbing / cyanosis. No joint deformity upper and lower extremities. Good ROM, no contractures. Normal muscle tone.  Skin: no rashes, lesions, ulcers. No induration Neurologic: CN 2-12 grossly intact.  Sensation intact, DTR normal. Strength 5/5 in all 4.  Psychiatric: Normal judgment and insight. Alert and oriented x 3. Normal mood.    Labs on Admission: I have personally reviewed following labs and imaging studies  CBC: Recent Labs  Lab 06/06/17 0006  WBC 4.0  NEUTROABS 2.8  HGB 8.5*  HCT 26.1*  MCV 96.0  PLT 73*   Basic Metabolic Panel: Recent Labs  Lab 06/06/17 0006  NA 139  K 4.2  CL 111  CO2 21*  GLUCOSE 138*  BUN 28*  CREATININE 2.22*  CALCIUM 7.8*   GFR: Estimated Creatinine Clearance: 31.3 mL/min (A) (by C-G formula based on SCr of 2.22 mg/dL (H)). Liver Function Tests: Recent Labs  Lab 06/06/17 0006  AST 18  ALT 9*  ALKPHOS 60  BILITOT 0.4  PROT 5.8*  ALBUMIN 2.6*   No results for input(s): LIPASE, AMYLASE in the last 168 hours. No results for input(s): AMMONIA in the last 168 hours. Coagulation Profile: No results for input(s): INR, PROTIME in the last 168 hours. Cardiac Enzymes: No results for input(s): CKTOTAL, CKMB, CKMBINDEX, TROPONINI in the last 168 hours. BNP (last 3 results) No results for input(s): PROBNP in the last 8760 hours. HbA1C: No results for input(s): HGBA1C in the last 72 hours. CBG: No results for input(s): GLUCAP in the last 168 hours. Lipid Profile: No results for input(s): CHOL, HDL, LDLCALC, TRIG, CHOLHDL, LDLDIRECT in the last 72 hours. Thyroid Function Tests: No results for input(s): TSH, T4TOTAL, FREET4, T3FREE, THYROIDAB in the last 72 hours. Anemia Panel: No results for input(s): VITAMINB12, FOLATE, FERRITIN, TIBC, IRON, RETICCTPCT in the last 72 hours. Urine analysis:    Component Value Date/Time   COLORURINE YELLOW 06/06/2017 0150   APPEARANCEUR TURBID (A) 06/06/2017 0150   LABSPEC 1.016 06/06/2017 0150   PHURINE 5.0 06/06/2017 0150   GLUCOSEU NEGATIVE 06/06/2017 0150   HGBUR  SMALL (A) 06/06/2017 0150   HGBUR trace-intact 12/29/2006 0827   BILIRUBINUR NEGATIVE 06/06/2017 0150   KETONESUR NEGATIVE  06/06/2017 0150   PROTEINUR 100 (A) 06/06/2017 0150   UROBILINOGEN 0.2 12/29/2006 0827   NITRITE NEGATIVE 06/06/2017 0150   LEUKOCYTESUR MODERATE (A) 06/06/2017 0150    Radiological Exams on Admission: Dg Chest 2 View  Result Date: 06/05/2017 CLINICAL DATA:  Weakness, hypotension EXAM: CHEST - 2 VIEW COMPARISON:  PET-CT dated 04/28/2017 FINDINGS: Mild left basilar scarring/atelectasis. No focal consolidation. No pleural effusion or pneumothorax. The heart is normal in size. Right chest port terminates in the lower SVC. Degenerative changes of the visualized thoracolumbar spine. IMPRESSION: No evidence of acute cardiopulmonary disease. Electronically Signed   By: Julian Hy M.D.   On: 06/05/2017 23:40    Assessment/Plan Principal Problem:   Sepsis ([place]) Active Problems:   Hyperlipidemia, mixed   Essential hypertension   Diabetic peripheral neuropathy (H[facility])   Coronary artery disease   COPD (chronic obstructive pulmonary disease) ([facility])   Paroxysmal atrial fibrillation ([facility])   Poorly controlled type 2 diabetes mellitus with neuropathy ([facility])   Diffuse non-Hodgkin's lymphoma of testis ([place])   Severe sepsis Patient meets sepsis criteria secondary to fever, tachycardia and likely urinary source.  Initially hypotensive but responded to fluids.  Associated acute kidney injury. -Switch to ceftriaxone (patient has used cephalosporins in the past -Urine and blood cultures pending  Suprapubic tenderness Patient with minimal urine output today.  Significantly tender on palpation.  Suspect urinary retention. -Bladder scan -Renal ultrasound in setting of likely retention and acute kidney injury -in/out cath if retaining urine and follow urine output  Paroxysmal atrial fibrillation Patient was taken off of Coumadin earlier this year secondary to thrombocytopenia, but put back on. On metoprolol for rate control. Currently sounds sinus and is rate controlled. -Continue  metoprolol -Continue Coumadin (please see addendum below)  Thrombocytopenia Chronic. No bleeding. Follows with hematology/oncology. Intermittent improvements but largely stable.  Diabetes mellitus, type 2 Last hemoglobin A1C of 6.4% which is controlled. -SSI while inpatient  Non-hodgkin's lymphoma Currently in remission per patient. S/p salve chemotherapy.  Anemia Chronic. Followed by hematology/oncology. Iron studies appear normal with elevated erythropoietin. Likely related to chemotherapy. No active bleeding. -Repeat CBC in AM  ?BPH No diagnosis per patient, but is on Flomax -Continue Flomax  CAD Hyperlipidemia -Continue Lipitor  Overactive bladder -Holding medications secondary to likely bladder retention  Constipation Likely secondary to medications used for above. -Hold oxybutynin, trospium chloride  Addendum Patient with retention of 800 mL of urine on bladder scan. Foley attempted unsuccessfully by nursing and patient had some gross hematuria with blood clots. Called on-call urology for foley placement. Performed rectal exam to rule out prostatitis; prostate was firm, enlarged and smooth; non-tender. Will hold Coumadin for today. If hematuria resolves, recommend restarting tomorrow. Pharmacy updated.   DVT prophylaxis: Warfarin (SCDs while coumadin held) Code Status: Full code Family Communication: Son at bedside Disposition Plan: Discharge likely in 2-3 days pending sepsis workup Consults called: None Admission status: Inpatient, telemetry   Cordelia Poche, MD [facility] Pager [phone]  If 7PM-7AM, please contact night-coverage [email or web address] Password [email or web address]  06/06/2017, 9:02 AM

## 2017-06-06 NOTE — Progress Notes (Signed)
Patient came to the floor from [facility] ED. The admitting for [facility] was notified.

## 2017-06-06 NOTE — Progress Notes (Signed)
RN notified Winferd Wease (brother) that patient was in the hospital. Left a message on the voicemail. [phone]

## 2017-06-06 NOTE — Care Management Note (Signed)
Case Management Note  Patient Details  Name: Gary Yates. MRN: [id number] Date of Birth: 11-11-1933  Subjective/Objective:84 y/o m admitted w/Sepsis. From home. Active [facility] rep Sharyn Lull aware. PT cons-await recc.                    Action/Plan:d/c home w/HHC.   Expected Discharge Date:                  Expected Discharge Plan:  [place]  In-House Referral:     Discharge planning Services  CM Consult  Post Acute Care Choice:  Home Health(Active w/[facility].) Choice offered to:     DME Arranged:    DME Agency:     HH Arranged:    [place] Agency:     Status of Service:  In process, will continue to follow  If discussed at Long Length of Stay Meetings, dates discussed:    Additional Comments:  Dessa Phi, RN 06/06/2017, 12:29 PM

## 2017-06-06 NOTE — Progress Notes (Signed)
A consult was received from an ED physician for azactam, levaquin and vancomycin per pharmacy dosing.  The patient's profile has been reviewed for ht/wt/allergies/indication/available labs.   A one time order has been placed for Azactam 2 Gm , Levaquin 750 mg and Vancomycin 2 Gm.  Further antibiotics/pharmacy consults should be ordered by admitting physician if indicated.    **Patient has tolerated multiple cephalosporins in the past:  Ancef, Omnicef and Keflex. Please consider a cephalosporin if appropriate.**                 Thank you, Dorrene German 06/06/2017  1:24 AM

## 2017-06-06 NOTE — ED Notes (Signed)
Pt urinated but specimen was not enough. Will continue to remind pt of urine specimen.

## 2017-06-07 LAB — PROTIME-INR
INR: 6.14 — AB
Prothrombin Time: 54.1 seconds — ABNORMAL HIGH (ref 11.4–15.2)

## 2017-06-07 LAB — GLUCOSE, CAPILLARY
GLUCOSE-CAPILLARY: 98 mg/dL (ref 65–99)
Glucose-Capillary: 105 mg/dL — ABNORMAL HIGH (ref 65–99)
Glucose-Capillary: 125 mg/dL — ABNORMAL HIGH (ref 65–99)

## 2017-06-07 LAB — BASIC METABOLIC PANEL
ANION GAP: 8 (ref 5–15)
BUN: 15 mg/dL (ref 6–20)
CO2: 19 mmol/L — AB (ref 22–32)
Calcium: 7.9 mg/dL — ABNORMAL LOW (ref 8.9–10.3)
Chloride: 113 mmol/L — ABNORMAL HIGH (ref 101–111)
Creatinine, Ser: 1.27 mg/dL — ABNORMAL HIGH (ref 0.61–1.24)
GFR calc Af Amer: 59 mL/min — ABNORMAL LOW (ref >60)
GFR, EST NON AFRICAN AMERICAN: 51 mL/min — AB (ref >60)
GLUCOSE: 108 mg/dL — AB (ref 65–99)
POTASSIUM: 3.8 mmol/L (ref 3.5–5.1)
Sodium: 140 mmol/L (ref 135–145)

## 2017-06-07 LAB — CBC
HEMATOCRIT: 24.7 % — AB (ref 39.0–52.0)
HEMOGLOBIN: 8.2 g/dL — AB (ref 13.0–17.0)
MCH: 31.8 pg (ref 26.0–34.0)
MCHC: 33.2 g/dL (ref 30.0–36.0)
MCV: 95.7 fL (ref 78.0–100.0)
Platelets: 63 10*3/uL — ABNORMAL LOW (ref 150–400)
RBC: 2.58 MIL/uL — ABNORMAL LOW (ref 4.22–5.81)
RDW: 16.2 % — ABNORMAL HIGH (ref 11.5–15.5)
WBC: 3.6 10*3/uL — ABNORMAL LOW (ref 4.0–10.5)

## 2017-06-07 MED ORDER — POLYETHYLENE GLYCOL 3350 17 G PO PACK
17.0000 g | PACK | Freq: Every day | ORAL | Status: DC
  Administered 2017-06-08 – 2017-06-09 (×2): 17 g via ORAL
  Filled 2017-06-07 (×6): qty 1

## 2017-06-07 MED ORDER — LIDOCAINE VISCOUS HCL 2 % MT SOLN
15.0000 mL | Freq: Four times a day (QID) | OROMUCOSAL | Status: DC | PRN
  Administered 2017-06-07 – 2017-06-12 (×5): 15 mL via OROMUCOSAL
  Filled 2017-06-07 (×8): qty 15

## 2017-06-07 MED ORDER — VITAMIN K1 10 MG/ML IJ SOLN
2.5000 mg | Freq: Once | INTRAVENOUS | Status: AC
  Administered 2017-06-07: 2.5 mg via INTRAVENOUS
  Filled 2017-06-07: qty 0.25

## 2017-06-07 MED ORDER — LIP MEDEX EX OINT
TOPICAL_OINTMENT | CUTANEOUS | Status: AC
  Administered 2017-06-07: 09:00:00
  Filled 2017-06-07: qty 7

## 2017-06-07 MED ORDER — SODIUM CHLORIDE 0.9 % IV SOLN
INTRAVENOUS | Status: DC
  Administered 2017-06-07 (×2): via INTRAVENOUS

## 2017-06-07 MED ORDER — SENNOSIDES-DOCUSATE SODIUM 8.6-50 MG PO TABS
1.0000 | ORAL_TABLET | Freq: Two times a day (BID) | ORAL | Status: DC
  Administered 2017-06-07 – 2017-06-12 (×10): 1 via ORAL
  Filled 2017-06-07 (×10): qty 1

## 2017-06-07 NOTE — Progress Notes (Signed)
CRITICAL VALUE ALERT  Critical Value:  PT 54.1,  INR 6.14  Date & Time Notied:  06/07/2017   Provider Notified: MD Willodean Rosenthal  Orders Received/Actions taken: none at this time.

## 2017-06-07 NOTE — Progress Notes (Signed)
Pt HR went into the 170's for 30 sec, teley tech made RN aware. V/S [id number]. Pt alertx 4 to c/o pain, pt was sleeping at the time made MD [place] aware no orders given at this time. I will continue to monitor.

## 2017-06-07 NOTE — Progress Notes (Signed)
PROGRESS NOTE  [name] UYQ:[id number] DOB: 11-25-1933 DOA: 06/05/2017 PCP: [name], MD  HPI/Recap of past 24 hours: Arvel, Oquinn. is a 84 year old male with medical history significant for paroxysmal A. fib, COPD, CAD, diabetes, non-Hodgkin's/testicular lymphoma, BPH, hyperlipidemia, hypertension, GERD presented to the ER complaining of worsening constipation, worsening dysuria and fever, ongoing for the past 3 days.  Patient tried taking Tylenol as well as his medication for overactive bladder with no effects.  In the ED patient noted to have a fever of 100.4, hypotensive, UA significant for infection.  Patient was also noted to have lower abdominal pain, subsequently bladder scan showed about 800 cc of urine.  Urology was consulted due to difficult Foley insertion.  Patient admitted for further management.  Today, patient reported feeling better, denies any abdominal pain, chest pain, shortness of breath, nausea/vomiting.  Patient had a fever overnight of 101.9.  Assessment/Plan: Principal Problem:   Sepsis ([place]) Active Problems:   Hyperlipidemia, mixed   Essential hypertension   Diabetic peripheral neuropathy (HCC)   Coronary artery disease   COPD (chronic obstructive pulmonary disease) (HCC)   Paroxysmal atrial fibrillation (HCC)   Poorly controlled type 2 diabetes mellitus with neuropathy (HCC)   Diffuse non-Hodgkin's lymphoma of testis (HCC)  Severe sepsis likely due to UTI Last temp spike 101.9 overnight, no leukocytosis On presentation, patient with fever, tachycardia and hypotension BC x2 pending UA showed moderate leukocytes, few bacteria, WBC clumps, WBCs greater than 50 UC grew greater than 100,000 colonies of Staphylococcus, MIC pending Continue IV Rocephin Continue IV fluids as BP soft  Acute urinary retention/BPH/hematuria Bladder scan with over 800 cc of urine Likely due to BPH Status post Foley catheter insertion by urology, currently  draining henaturia Renal ultrasound showed no mass or hydronephrosis, just bilateral renal cystic changes without acute abnormalities Continue Flomax Urology on board, rec continue foley until follow-up appt in 1 week  Supratherapeutic INR Patient on Coumadin, INR noted to be 6.91 on admission Due to current hematuria reversed with IV vitamin K 2.5 mg Hold Coumadin Daily INR  CKD stage II Creatinine at baseline Daily BMP  Constipation Continue MiraLAX, senna  Anemia of chronic disease Baseline around 9-10, currently around baseline Monitor closely due to hematuria Daily CBC  Paroxysmal A. Fib Rate controlled Continue metoprolol, held Coumadin as above  Diabetes mellitus type 2 Last A1c 6.4 SSI, Accu-Cheks  Non-Hodgkin's/testicular lymphoma In remission Status post chemotherapy  CAD/hyperlipidemia Continue statins  Overactive bladder Hold oxybutynin     Code Status: Full  Family Communication: Son at bedside  Disposition Plan: Home once stable   Consultants:  Urology  Procedures:  None  Antimicrobials:  IV Rocephin  DVT prophylaxis: SCDs   Objective: Vitals:   06/06/17 2137 06/07/17 0420 06/07/17 0657 06/07/17 1254  BP: 118/64 (!) 109/55 116/68 (!) 108/54  Pulse: 97 87  79  Resp: 16 20 20 18   Temp: (!) 101.9 F (38.8 C) 97.9 F (36.6 C) 99.5 F (37.5 C) 98.3 F (36.8 C)  TempSrc: Oral Oral Oral Oral  SpO2: 100% 98% 100% 96%  Weight:      Height:        Intake/Output Summary (Last 24 hours) at 06/07/2017 1427 Last data filed at 06/07/2017 1304 Gross per 24 hour  Intake 740 ml  Output 2475 ml  Net -1735 ml   Filed Weights   06/05/17 2357 06/06/17 0635  Weight: 102.5 kg (226 lb) 106 kg (233 lb 11 oz)  Exam:   General: NAD, foley draining red urine  Cardiovascular: S1, S2 present  Respiratory: Diminished breath sounds bilaterally  Abdomen: Soft, nontender, nondistended, bowel sounds present  Musculoskeletal: Trace  bilateral pedal edema  Skin: Normal  Psychiatry: Normal mood   Data Reviewed: CBC: Recent Labs  Lab 06/06/17 0006 06/06/17 2031 06/07/17 0446  WBC 4.0 4.1 3.6*  NEUTROABS 2.8  --   --   HGB 8.5* 8.4* 8.2*  HCT 26.1* 25.6* 24.7*  MCV 96.0 96.6 95.7  PLT 73* 68* 63*   Basic Metabolic Panel: Recent Labs  Lab 06/06/17 0006 06/07/17 0446  NA 139 140  K 4.2 3.8  CL 111 113*  CO2 21* 19*  GLUCOSE 138* 108*  BUN 28* 15  CREATININE 2.22* 1.27*  CALCIUM 7.8* 7.9*   GFR: Estimated Creatinine Clearance: 54.7 mL/min (A) (by C-G formula based on SCr of 1.27 mg/dL (H)). Liver Function Tests: Recent Labs  Lab 06/06/17 0006  AST 18  ALT 9*  ALKPHOS 60  BILITOT 0.4  PROT 5.8*  ALBUMIN 2.6*   No results for input(s): LIPASE, AMYLASE in the last 168 hours. No results for input(s): AMMONIA in the last 168 hours. Coagulation Profile: Recent Labs  Lab 06/06/17 1453 06/07/17 0446  INR 6.91* 6.14*   Cardiac Enzymes: No results for input(s): CKTOTAL, CKMB, CKMBINDEX, TROPONINI in the last 168 hours. BNP (last 3 results) No results for input(s): PROBNP in the last 8760 hours. HbA1C: No results for input(s): HGBA1C in the last 72 hours. CBG: Recent Labs  Lab 06/06/17 0935 06/06/17 1147 06/06/17 1647 06/07/17 0744 06/07/17 1153  GLUCAP 118* 117* 108* 105* 125*   Lipid Profile: No results for input(s): CHOL, HDL, LDLCALC, TRIG, CHOLHDL, LDLDIRECT in the last 72 hours. Thyroid Function Tests: No results for input(s): TSH, T4TOTAL, FREET4, T3FREE, THYROIDAB in the last 72 hours. Anemia Panel: No results for input(s): VITAMINB12, FOLATE, FERRITIN, TIBC, IRON, RETICCTPCT in the last 72 hours. Urine analysis:    Component Value Date/Time   COLORURINE YELLOW 06/06/2017 0150   APPEARANCEUR TURBID (A) 06/06/2017 0150   LABSPEC 1.016 06/06/2017 0150   PHURINE 5.0 06/06/2017 0150   GLUCOSEU NEGATIVE 06/06/2017 0150   HGBUR SMALL (A) 06/06/2017 0150   HGBUR trace-intact  12/29/2006 0827   BILIRUBINUR NEGATIVE 06/06/2017 0150   KETONESUR NEGATIVE 06/06/2017 0150   PROTEINUR 100 (A) 06/06/2017 0150   UROBILINOGEN 0.2 12/29/2006 0827   NITRITE NEGATIVE 06/06/2017 0150   LEUKOCYTESUR MODERATE (A) 06/06/2017 0150   Sepsis Labs: @LABRCNTIP (procalcitonin:4,lacticidven:4)  ) Recent Results (from the past 240 hour(s))  Urine culture     Status: Abnormal (Preliminary result)   Collection Time: 06/06/17  1:50 AM  Result Value Ref Range Status   Specimen Description   Final    URINE, RANDOM Performed at [facility], [place] [address], [place], [place] [address]    Special Requests   Final    NONE Performed at [facility], [place] [address] [place], [place] [address]    Culture (A)  Final    >=100,000 COLONIES/mL STAPHYLOCOCCUS SPECIES (COAGULASE NEGATIVE) SUSCEPTIBILITIES TO FOLLOW Performed at [facility], [place] [address] [place], [place] [address]    Report Status PENDING  Incomplete      Studies: US Renal  Result Date: 06/06/2017 CLINICAL DATA:  Acute renal injury EXAM: RENAL / URINARY TRACT ULTRASOUND COMPLETE COMPARISON:  04/28/2017 PET-CT FINDINGS: Right Kidney: Length: 12.2 cm. No mass or hydronephrosis is noted. A 5.6 cm  cyst is noted in the midportion of the right kidney similar to that seen on prior PET-CT. Smaller cyst is noted as well also stable from the recent PET-CT. Left Kidney: Length: 12.4 cm. No mass or hydronephrosis is noted. Renal cystic changes noted similar to that seen on prior CT T. The largest of these measures 5.1 cm in greatest dimension. Bladder: Decompressed by Foley catheter. IMPRESSION: Bilateral renal cystic change without acute abnormality. Electronically Signed   By: Inez Catalina M.D.   On: 06/06/2017 15:56    Scheduled Meds: . atorvastatin  40 mg Oral Daily  . gabapentin  200 mg Oral TID  . insulin aspart  0-9 Units Subcutaneous TID WC  . insulin glargine  20 Units  Subcutaneous Daily  . metoprolol succinate  100 mg Oral Daily  . omega-3 acid ethyl esters  1 g Oral Daily  . pantoprazole  40 mg Oral Daily  . pramipexole  3 mg Oral QHS  . tamsulosin  0.4 mg Oral QHS  . vitamin B-12  1,000 mcg Oral Daily    Continuous Infusions: . cefTRIAXone (ROCEPHIN)  IV Stopped (06/07/17 0959)     LOS: 1 day     Alma Friendly, MD [facility]   If 7PM-7AM, please contact night-coverage [email or web address] Password [facility] 06/07/2017, 2:27 PM

## 2017-06-07 NOTE — Evaluation (Signed)
Physical Therapy Evaluation Patient Details Name: Gary Yates. MRN: [id number] DOB: 1933-07-11 Today's Date: 06/07/2017   History of Present Illness  Gary Yates. is a 84 y.o. male with medical history significant of paroxysmal atrial fibrillation, COPD, CAD, diabetes, non-Hodgkin's lymphoma status post chemo and radiation therapy, hyperlipidemia, hypertension, GERD.  Admitted with fever and sepsis and urinary retention with hematuria.  Clinical Impression  Patient presents with decreased independence with mobility due to generalized weakness and history of balance d/o with neuropathy.  Currently minguard A to S and feel he can return home with family support to resume HHPT.  Feel adding [place] aide for bathing indicated as well.  PT to follow acutely.    Follow Up Recommendations Home health PT(HH aide)    Equipment Recommendations  None recommended by PT    Recommendations for Other Services       Precautions / Restrictions Precautions Precautions: Fall      Mobility  Bed Mobility Overal bed mobility: Needs Assistance Bed Mobility: Supine to Sit     Supine to sit: HOB elevated;Supervision     General bed mobility comments: increased time and effort use and rails  Transfers Overall transfer level: Needs assistance Equipment used: Rolling walker (2 wheeled) Transfers: Sit to/from Stand Sit to Stand: From elevated surface;Min guard         General transfer comment: for balance, difficutly from regular height so height of bed elevated  Ambulation/Gait Ambulation/Gait assistance: Supervision;Min guard Ambulation Distance (Feet): 130 Feet Assistive device: 4-wheeled walker Gait Pattern/deviations: Step-through pattern;Decreased stride length;Trunk flexed     General Gait Details: slow but steady self limited due to needing to have bowel movement  Stairs            Wheelchair Mobility    Modified Rankin (Stroke Patients Only)       Balance Overall  balance assessment: Needs assistance Sitting-balance support: Feet supported Sitting balance-Leahy Scale: Good     Standing balance support: Bilateral upper extremity supported Standing balance-Leahy Scale: Fair Standing balance comment: UE support for balance, but able to wash hands at sink                             Pertinent Vitals/Pain Pain Assessment: Faces Faces Pain Scale: Hurts little more Pain Location: at catheter site Pain Descriptors / Indicators: Discomfort;Grimacing Pain Intervention(s): Monitored during session;Repositioned    Home Living Family/patient expects to be discharged to:: Private residence Living Arrangements: Spouse/significant other Available Help at Discharge: Family;Available 24 hours/day Type of Home: House Home Access: Stairs to enter Entrance Stairs-Rails: Right Entrance Stairs-Number of Steps: 2 Home Layout: Able to live on main level with bedroom/bathroom;Two level Home Equipment: Walker - 4 wheels;Shower seat - built in;Grab bars - toilet;Grab bars - tub/shower;Hand held shower head;Bedside commode      Prior Function Level of Independence: Needs assistance   Gait / Transfers Assistance Needed: used rollator  ADL's / Homemaking Assistance Needed: dtrs and wife assisted with bathing/dressing, didn't cook, at out        [employer]        Extremity/Trunk Assessment   Upper Extremity Assessment Upper Extremity Assessment: Generalized weakness    Lower Extremity Assessment Lower Extremity Assessment: Generalized weakness       Communication   Communication: No difficulties  Cognition Arousal/Alertness: Awake/alert Behavior During Therapy: WFL for tasks assessed/performed Overall Cognitive Status: Within Functional Limits for tasks assessed  General Comments General comments (skin integrity, edema, etc.): wife and daughter in the room and attentive, wife  may have some memory issues from observation; patient toileted during session and able to perform hygiene with set up    Exercises     Assessment/Plan    PT Assessment Patient needs continued PT services  PT Problem List Decreased strength;Decreased mobility;Decreased balance;Decreased knowledge of use of DME;Decreased activity tolerance       PT Treatment Interventions Gait training;Therapeutic activities;Stair training;Therapeutic exercise;Functional mobility training;DME instruction;Balance training;Patient/family education    PT Goals (Current goals can be found in the Care Plan section)  Acute Rehab PT Goals Patient Stated Goal: to go home PT Goal Formulation: With patient/family Time For Goal Achievement: 06/14/17 Potential to Achieve Goals: Good    Frequency Min 3X/week   Barriers to discharge        Co-evaluation               AM-PAC PT "6 Clicks" Daily Activity  Outcome Measure Difficulty turning over in bed (including adjusting bedclothes, sheets and blankets)?: A Little Difficulty moving from lying on back to sitting on the side of the bed? : A Lot Difficulty sitting down on and standing up from a chair with arms (e.g., wheelchair, bedside commode, etc,.)?: Unable Help needed moving to and from a bed to chair (including a wheelchair)?: A Little Help needed walking in hospital room?: A Little Help needed climbing 3-5 steps with a railing? : A Lot 6 Click Score: 14    End of Session Equipment Utilized During Treatment: Gait belt Activity Tolerance: Patient tolerated treatment well Patient left: with call bell/phone within reach;in chair;with family/visitor present   PT Visit Diagnosis: Other abnormalities of gait and mobility (R26.89);Muscle weakness (generalized) (M62.81)    Time: 1115-1150 PT Time Calculation (min) (ACUTE ONLY): 35 min   Charges:   PT Evaluation $PT Eval Moderate Complexity: 1 Mod PT Treatments $Gait Training: 8-22 mins   PT G  CodesMagda Kiel, [place] [phone] 06/07/2017   Reginia Naas 06/07/2017, 1:27 PM

## 2017-06-08 LAB — BASIC METABOLIC PANEL
Anion gap: 8 (ref 5–15)
BUN: 12 mg/dL (ref 6–20)
CO2: 21 mmol/L — ABNORMAL LOW (ref 22–32)
Calcium: 7.8 mg/dL — ABNORMAL LOW (ref 8.9–10.3)
Chloride: 109 mmol/L (ref 101–111)
Creatinine, Ser: 1.07 mg/dL (ref 0.61–1.24)
GFR calc Af Amer: >60 mL/min (ref >60)
GFR calc non Af Amer: >60 mL/min (ref >60)
Glucose, Bld: 97 mg/dL (ref 65–99)
Potassium: 3.5 mmol/L (ref 3.5–5.1)
Sodium: 138 mmol/L (ref 135–145)

## 2017-06-08 LAB — CBC WITH DIFFERENTIAL/PLATELET
Basophils Absolute: 0 10*3/uL (ref 0.0–0.1)
Basophils Relative: 0 %
Eosinophils Absolute: 0 10*3/uL (ref 0.0–0.7)
Eosinophils Relative: 1 %
HCT: 25.1 % — ABNORMAL LOW (ref 39.0–52.0)
Hemoglobin: 8.1 g/dL — ABNORMAL LOW (ref 13.0–17.0)
Lymphocytes Relative: 21 %
Lymphs Abs: 0.5 10*3/uL — ABNORMAL LOW (ref 0.7–4.0)
MCH: 30.9 pg (ref 26.0–34.0)
MCHC: 32.3 g/dL (ref 30.0–36.0)
MCV: 95.8 fL (ref 78.0–100.0)
Monocytes Absolute: 0.2 10*3/uL (ref 0.1–1.0)
Monocytes Relative: 9 %
Neutro Abs: 1.5 10*3/uL — ABNORMAL LOW (ref 1.7–7.7)
Neutrophils Relative %: 69 %
Platelets: 55 10*3/uL — ABNORMAL LOW (ref 150–400)
RBC: 2.62 MIL/uL — ABNORMAL LOW (ref 4.22–5.81)
RDW: 15.8 % — ABNORMAL HIGH (ref 11.5–15.5)
WBC: 2.2 10*3/uL — ABNORMAL LOW (ref 4.0–10.5)

## 2017-06-08 LAB — URINE CULTURE: Culture: >=100000 — AB

## 2017-06-08 LAB — PROTIME-INR
INR: 1.54
PROTHROMBIN TIME: 18.4 s — AB (ref 11.4–15.2)

## 2017-06-08 LAB — GLUCOSE, CAPILLARY
Glucose-Capillary: 101 mg/dL — ABNORMAL HIGH (ref 65–99)
Glucose-Capillary: 81 mg/dL (ref 65–99)
Glucose-Capillary: 98 mg/dL (ref 65–99)
Glucose-Capillary: 104 mg/dL — ABNORMAL HIGH (ref 65–99)

## 2017-06-08 MED ORDER — WARFARIN SODIUM 5 MG PO TABS
7.5000 mg | ORAL_TABLET | Freq: Once | ORAL | Status: AC
  Administered 2017-06-08: 7.5 mg via ORAL
  Filled 2017-06-08: qty 1

## 2017-06-08 MED ORDER — DOXYCYCLINE HYCLATE 100 MG PO TABS
100.0000 mg | ORAL_TABLET | Freq: Two times a day (BID) | ORAL | Status: DC
  Administered 2017-06-08: 100 mg via ORAL
  Filled 2017-06-08: qty 1

## 2017-06-08 MED ORDER — TRAMADOL HCL 50 MG PO TABS
50.0000 mg | ORAL_TABLET | Freq: Once | ORAL | Status: AC
  Administered 2017-06-08: 50 mg via ORAL
  Filled 2017-06-08: qty 1

## 2017-06-08 MED ORDER — INSULIN GLARGINE 100 UNIT/ML ~~LOC~~ SOLN
10.0000 [IU] | Freq: Every day | SUBCUTANEOUS | Status: DC
  Administered 2017-06-08 – 2017-06-12 (×5): 10 [IU] via SUBCUTANEOUS
  Filled 2017-06-08 (×5): qty 0.1

## 2017-06-08 MED ORDER — TRAMADOL HCL 50 MG PO TABS
50.0000 mg | ORAL_TABLET | Freq: Two times a day (BID) | ORAL | Status: DC | PRN
  Administered 2017-06-09 – 2017-06-12 (×5): 50 mg via ORAL
  Filled 2017-06-08 (×5): qty 1

## 2017-06-08 MED ORDER — WARFARIN - PHARMACIST DOSING INPATIENT
Freq: Every day | Status: DC
  Administered 2017-06-09: 19:00:00

## 2017-06-08 NOTE — Progress Notes (Signed)
ANTICOAGULATION CONSULT NOTE - Initial Consult  Pharmacy Consult for Warfarin Indication: atrial fibrillation  Allergies  Allergen Reactions  . Ace Inhibitors Other (See Comments)    cough  . Codeine Nausea Only and Rash       . Penicillins Rash    Childhood allergy Has patient had a PCN reaction causing immediate rash, facial/tongue/throat swelling, SOB or lightheadedness with hypotension: Yes Has patient had a PCN reaction causing severe rash involving mucus membranes or skin necrosis: Yes Has patient had a PCN reaction that required hospitalization No Has patient had a PCN reaction occurring within the last 10 years: No If all of the above answers are "NO", then may proceed with Cephalosporin use.     Patient Measurements: Height: 5\' 10"  (177.8 cm) Weight: 233 lb 11 oz (106 kg) IBW/kg (Calculated) : 73  Vital Signs: Temp: 98.1 F (36.7 C) (06/02 1345) Temp Source: Oral (06/02 1345) BP: 120/67 (06/02 1345) Pulse Rate: 81 (06/02 1345)  Labs: Recent Labs    06/06/17 0006 06/06/17 1453 06/06/17 2031 06/07/17 0446 06/08/17 0445  HGB 8.5*  --  8.4* 8.2* 8.1*  HCT 26.1*  --  25.6* 24.7* 25.1*  PLT 73*  --  68* 63* 55*  LABPROT  --  59.3*  --  54.1* 18.4*  INR  --  6.91*  --  6.14* 1.54  CREATININE 2.22*  --   --  1.27* 1.07    Estimated Creatinine Clearance: 64.9 mL/min (by C-G formula based on SCr of 1.07 mg/dL).   Medical History: Past Medical History:  Diagnosis Date  . Anemia in chronic renal disease 05/07/2017  . Anxiety   . Atrial fibrillation ([place])   . COPD (chronic obstructive pulmonary disease) ([place])    pt. denies  . Coronary artery disease    a. h/o Overlapping stents RCA;  b. 06/2011 Cath: patent stents, nonobs dzs, NL EF.  . Diabetic peripheral neuropathy ([place])   . Diffuse non-Hodgkin's lymphoma of testis ([place]) 09/28/2015  . DM (diabetes mellitus) ([place])    Type 2, peripheral neuropathy.  . Dyspnea    with exertion  . Dysrhythmia   . GERD  (gastroesophageal reflux disease)   . Headache   . History of bronchitis   . History of kidney stones   . History of radiation therapy 02/19/16 - 03/13/16   Testis/Scrotum: 32.4 Gy in 18 fractions  . History of radiation therapy 08/07/16-08/20/16   left adrenal gland mass treated to 30 Gy in 10 fractions  . Hyperlipidemia   . Hypertension   . Low testosterone   . Nephrolithiasis   . Osteoarthritis    shoulder  . Restless leg   . SVT (supraventricular tachycardia) ([place])   . Urinary frequency   . Wears partial dentures    upper and lower    Medications:  PTA on warfarin 7.5mg  daily except 5mg  Wed  INR upon admission : 6.91  Vitamin K 2.5mg  IV x 1 given on 6/1    Assessment:  84 yr male admitted on 5/31 with sepsis likely due to UTI  PMH significant for AFib, DM, NHL, CAD, HLD  Known chronic low platelet count (PLTC = 55K today)  No hematuria noted today  Pharmacy consulted to dose warfarin  Goal of Therapy:  INR 2-3   Plan:   Warfarin 7.5mg  po x 1 dose today  Daily PT/INR  Enzo Treu, Toribio Harbour, PharmD 06/08/2017,2:57 PM

## 2017-06-08 NOTE — Plan of Care (Signed)
  Problem: Health Behavior/Discharge Planning: Goal: Ability to manage health-related needs will improve Outcome: Progressing   Problem: Elimination: Goal: Will not experience complications related to urinary retention Outcome: Progressing   Problem: Safety: Goal: Ability to remain free from injury will improve Outcome: Progressing   Problem: Skin Integrity: Goal: Risk for impaired skin integrity will decrease Outcome: Progressing   

## 2017-06-08 NOTE — Progress Notes (Signed)
PROGRESS NOTE  Gary Yates. HYW:[id number] DOB: 1933-04-14 DOA: 06/05/2017 PCP: Gary Post, MD  HPI/Recap of past 24 hours: Gary Yates, Gary Yates. is a 84 year old male with medical history significant for paroxysmal A. fib, COPD, CAD, diabetes, non-Hodgkin's/testicular lymphoma, BPH, hyperlipidemia, hypertension, GERD presented to the ER complaining of worsening constipation, worsening dysuria and fever, ongoing for the past 3 days.  Patient tried taking Tylenol as well as his medication for overactive bladder with no effects.  In the ED patient noted to have a fever of 100.4, hypotensive, UA significant for infection.  Patient was also noted to have lower abdominal pain, subsequently bladder scan showed about 800 cc of urine.  Urology was consulted due to difficult Foley insertion.  Patient admitted for further management.  Today, patient reported feeling better, denies any abdominal pain, chest pain, shortness of breath, nausea/vomiting. No BM yet, c/o BLE neuropathic pain  Assessment/Plan: Principal Problem:   Sepsis ([place]) Active Problems:   Hyperlipidemia, mixed   Essential hypertension   Diabetic peripheral neuropathy (HCC)   Coronary artery disease   COPD (chronic obstructive pulmonary disease) (HCC)   Paroxysmal atrial fibrillation (HCC)   Poorly controlled type 2 diabetes mellitus with neuropathy (HCC)   Diffuse non-Hodgkin's lymphoma of testis (HCC)  Severe sepsis likely due to UTI Last temp spike 101.9 overnight, no leukocytosis On presentation, patient with fever, tachycardia and hypotension BC x2 NGTD UA showed moderate leukocytes, few bacteria, WBC clumps, WBCs greater than 50 UC grew greater than 100,000 colonies of Staphylococcus specie Change to PO doxycycline for a total of 10 days of antibiotics Monitor closely  Acute urinary retention/BPH/hematuria Hematuria resolving Bladder scan with over 800 cc of urine on presentation Likely due to BPH Status Yates  Foley catheter insertion by urology, currently draining henaturia Renal ultrasound showed no mass or hydronephrosis, just bilateral renal cystic changes without acute abnormalities Continue Flomax Urology on board, rec continue foley until follow-up appt in 1 week  Supratherapeutic INR Resolved, now subtherapeutic Patient on Coumadin, INR noted to be 6.91 on admission Due to hematuria reversed with IV vitamin K 2.5 mg Restart Coumadin Daily INR  CKD stage II Creatinine at baseline Daily BMP  Constipation Continue MiraLAX, senna  Anemia of chronic disease Baseline around 9-10, currently around baseline Monitor closely Daily CBC  Paroxysmal A. Fib Rate controlled Continue metoprolol, Coumadin  Diabetes mellitus type 2 Last A1c 6.4 SSI, Accu-Cheks  Non-Hodgkin's/testicular lymphoma In remission Status Yates chemotherapy  CAD/hyperlipidemia Continue statins  Overactive bladder Hold oxybutynin     Code Status: Full  Family Communication: Son/wife/daughter at bedside  Disposition Plan: Home once stable   Consultants:  Urology  Procedures:  None  Antimicrobials:  PO doxycycline  DVT prophylaxis: SCDs, coumadin   Objective: Vitals:   06/07/17 1254 06/07/17 2124 06/08/17 0527 06/08/17 1345  BP: (!) 108/54 127/63 128/64 120/67  Pulse: 79 86 87 81  Resp: 18 18 18 18   Temp: 98.3 F (36.8 C) 99 F (37.2 C) 98.3 F (36.8 C) 98.1 F (36.7 C)  TempSrc: Oral Oral Oral Oral  SpO2: 96% 98% 100% 100%  Weight:      Height:        Intake/Output Summary (Last 24 hours) at 06/08/2017 1710 Last data filed at 06/08/2017 1556 Gross per 24 hour  Intake 1140 ml  Output 2950 ml  Net -1810 ml   Filed Weights   06/05/17 2357 06/06/17 0635  Weight: 102.5 kg (226 lb) 106 kg (233 lb 11  oz)    Exam:   General: NAD  Cardiovascular: S1, S2 present  Respiratory: Diminished breath sounds bilaterally  Abdomen: Soft, nontender, nondistended, bowel sounds  present  Musculoskeletal: Trace bilateral pedal edema  Skin: Normal  Psychiatry: Normal mood   Data Reviewed: CBC: Recent Labs  Lab 06/06/17 0006 06/06/17 2031 06/07/17 0446 06/08/17 0445  WBC 4.0 4.1 3.6* 2.2*  NEUTROABS 2.8  --   --  1.5*  HGB 8.5* 8.4* 8.2* 8.1*  HCT 26.1* 25.6* 24.7* 25.1*  MCV 96.0 96.6 95.7 95.8  PLT 73* 68* 63* 55*   Basic Metabolic Panel: Recent Labs  Lab 06/06/17 0006 06/07/17 0446 06/08/17 0445  NA 139 140 138  K 4.2 3.8 3.5  CL 111 113* 109  CO2 21* 19* 21*  GLUCOSE 138* 108* 97  BUN 28* 15 12  CREATININE 2.22* 1.27* 1.07  CALCIUM 7.8* 7.9* 7.8*   GFR: Estimated Creatinine Clearance: 64.9 mL/min (by C-G formula based on SCr of 1.07 mg/dL). Liver Function Tests: Recent Labs  Lab 06/06/17 0006  AST 18  ALT 9*  ALKPHOS 60  BILITOT 0.4  PROT 5.8*  ALBUMIN 2.6*   No results for input(s): LIPASE, AMYLASE in the last 168 hours. No results for input(s): AMMONIA in the last 168 hours. Coagulation Profile: Recent Labs  Lab 06/06/17 1453 06/07/17 0446 06/08/17 0445  INR 6.91* 6.14* 1.54   Cardiac Enzymes: No results for input(s): CKTOTAL, CKMB, CKMBINDEX, TROPONINI in the last 168 hours. BNP (last 3 results) No results for input(s): PROBNP in the last 8760 hours. HbA1C: No results for input(s): HGBA1C in the last 72 hours. CBG: Recent Labs  Lab 06/07/17 2131 06/08/17 0303 06/08/17 0748 06/08/17 1134 06/08/17 1631  GLUCAP 98 101* 81 98 104*   Lipid Profile: No results for input(s): CHOL, HDL, LDLCALC, TRIG, CHOLHDL, LDLDIRECT in the last 72 hours. Thyroid Function Tests: No results for input(s): TSH, T4TOTAL, FREET4, T3FREE, THYROIDAB in the last 72 hours. Anemia Panel: No results for input(s): VITAMINB12, FOLATE, FERRITIN, TIBC, IRON, RETICCTPCT in the last 72 hours. Urine analysis:    Component Value Date/Time   COLORURINE YELLOW 06/06/2017 0150   APPEARANCEUR TURBID (A) 06/06/2017 0150   LABSPEC 1.016  06/06/2017 0150   PHURINE 5.0 06/06/2017 0150   GLUCOSEU NEGATIVE 06/06/2017 0150   HGBUR SMALL (A) 06/06/2017 0150   HGBUR trace-intact 12/29/2006 0827   BILIRUBINUR NEGATIVE 06/06/2017 0150   KETONESUR NEGATIVE 06/06/2017 0150   PROTEINUR 100 (A) 06/06/2017 0150   UROBILINOGEN 0.2 12/29/2006 0827   NITRITE NEGATIVE 06/06/2017 0150   LEUKOCYTESUR MODERATE (A) 06/06/2017 0150   Sepsis Labs: @LABRCNTIP (procalcitonin:4,lacticidven:4)  ) Recent Results (from the past 240 hour(s))  Blood Culture (routine x 2)     Status: None (Preliminary result)   Collection Time: 06/06/17 12:06 AM  Result Value Ref Range Status   Specimen Description   Final    BLOOD RIGHT ANTECUBITAL Performed at [facility], [place] [address] [place], [place] [address]    Special Requests   Final    BOTTLES DRAWN AEROBIC AND ANAEROBIC Blood Culture adequate volume Performed at [facility] [address], [place], [place] [address]    Culture   Final    NO GROWTH 2 DAYS Performed at [facility] [address], [place], [place] [address]    Report Status PENDING  Incomplete  Blood Culture (routine x 2)     Status: None (Preliminary result)   Collection Time: 06/06/17  12:06 AM  Result Value Ref Range Status   Specimen Description   Final    BLOOD PORTA CATH Performed at [facility] [address] [place], [place] [address]    Special Requests   Final    BOTTLES DRAWN AEROBIC AND ANAEROBIC Blood Culture adequate volume Performed at [facility] [address] [place], [place] [address]    Culture   Final    NO GROWTH 2 DAYS Performed at [facility] [address] [place], [place] [address]    Report Status PENDING  Incomplete  Urine culture     Status: Abnormal   Collection Time: 06/06/17  1:50 AM  Result Value Ref Range Status   Specimen Description   Final    URINE, RANDOM Performed at [facility] [address], [place], [place] [address]    Special Requests   Final    NONE Performed at [facility], [place] [address], [place], [place] [address]    Culture (A)  Final    >=100,000 COLONIES/mL STAPHYLOCOCCUS SPECIES (COAGULASE NEGATIVE)   Report Status 06/08/2017 FINAL  Final   Organism ID, Bacteria STAPHYLOCOCCUS SPECIES (COAGULASE NEGATIVE) (A)  Final      Susceptibility   Staphylococcus species (coagulase negative) - MIC*    CIPROFLOXACIN >=8 RESISTANT Resistant     GENTAMICIN <=0.5 SENSITIVE Sensitive     NITROFURANTOIN <=16 SENSITIVE Sensitive     OXACILLIN >=4 RESISTANT Resistant     TETRACYCLINE <=1 SENSITIVE Sensitive     VANCOMYCIN <=0.5 SENSITIVE Sensitive     CLINDAMYCIN RESISTANT Resistant     RIFAMPIN <=0.5 SENSITIVE Sensitive     Inducible Clindamycin POSITIVE Resistant     * >=100,000 COLONIES/mL STAPHYLOCOCCUS SPECIES (COAGULASE NEGATIVE)      Studies: No results found.  Scheduled Meds: . atorvastatin  40 mg Oral Daily  . gabapentin  200 mg Oral TID  . insulin aspart  0-9 Units Subcutaneous TID WC  . insulin glargine  10 Units Subcutaneous Daily  . metoprolol succinate  100 mg Oral Daily  . omega-3 acid ethyl esters  1 g Oral Daily  . pantoprazole  40 mg Oral Daily  . polyethylene glycol  17 g Oral Daily  . pramipexole  3 mg Oral QHS  . senna-docusate  1 tablet Oral BID  . tamsulosin  0.4 mg Oral QHS  . vitamin B-12  1,000 mcg Oral Daily  . warfarin  7.5 mg Oral ONCE-1800  . Warfarin - Pharmacist Dosing Inpatient   Does not apply q1800    Continuous Infusions: . cefTRIAXone (ROCEPHIN)  IV Stopped (06/08/17 1207)     LOS: 2 days     Alma Friendly, MD [facility]   If 7PM-7AM, please contact night-coverage [email or web address] Password [email or web address] 06/08/2017, 5:10 PM

## 2017-06-08 NOTE — Progress Notes (Signed)
Pt had a run of SVT this am HR 170's, HR now in the 90's. Alert x4 v/s stable no c/o pain, paged X [place] NP no orders given at this time, I will continue to monitor.

## 2017-06-09 DIAGNOSIS — M1990 Unspecified osteoarthritis, unspecified site: Secondary | ICD-10-CM

## 2017-06-09 DIAGNOSIS — D61818 Other pancytopenia: Secondary | ICD-10-CM

## 2017-06-09 DIAGNOSIS — E1122 Type 2 diabetes mellitus with diabetic chronic kidney disease: Secondary | ICD-10-CM

## 2017-06-09 DIAGNOSIS — Z87891 Personal history of nicotine dependence: Secondary | ICD-10-CM

## 2017-06-09 DIAGNOSIS — D631 Anemia in chronic kidney disease: Secondary | ICD-10-CM

## 2017-06-09 DIAGNOSIS — N189 Chronic kidney disease, unspecified: Secondary | ICD-10-CM

## 2017-06-09 DIAGNOSIS — I4891 Unspecified atrial fibrillation: Secondary | ICD-10-CM

## 2017-06-09 DIAGNOSIS — R5381 Other malaise: Secondary | ICD-10-CM

## 2017-06-09 DIAGNOSIS — B958 Unspecified staphylococcus as the cause of diseases classified elsewhere: Secondary | ICD-10-CM

## 2017-06-09 DIAGNOSIS — R531 Weakness: Secondary | ICD-10-CM

## 2017-06-09 DIAGNOSIS — K5900 Constipation, unspecified: Secondary | ICD-10-CM

## 2017-06-09 DIAGNOSIS — Z794 Long term (current) use of insulin: Secondary | ICD-10-CM

## 2017-06-09 DIAGNOSIS — Z9225 Personal history of immunosupression therapy: Secondary | ICD-10-CM

## 2017-06-09 DIAGNOSIS — Z7901 Long term (current) use of anticoagulants: Secondary | ICD-10-CM

## 2017-06-09 DIAGNOSIS — R7989 Other specified abnormal findings of blood chemistry: Secondary | ICD-10-CM

## 2017-06-09 DIAGNOSIS — I2510 Atherosclerotic heart disease of native coronary artery without angina pectoris: Secondary | ICD-10-CM

## 2017-06-09 DIAGNOSIS — I890 Lymphedema, not elsewhere classified: Secondary | ICD-10-CM

## 2017-06-09 DIAGNOSIS — K219 Gastro-esophageal reflux disease without esophagitis: Secondary | ICD-10-CM

## 2017-06-09 DIAGNOSIS — Z79899 Other long term (current) drug therapy: Secondary | ICD-10-CM

## 2017-06-09 DIAGNOSIS — I129 Hypertensive chronic kidney disease with stage 1 through stage 4 chronic kidney disease, or unspecified chronic kidney disease: Secondary | ICD-10-CM

## 2017-06-09 LAB — BASIC METABOLIC PANEL
ANION GAP: 8 (ref 5–15)
BUN: 11 mg/dL (ref 6–20)
CO2: 22 mmol/L (ref 22–32)
Calcium: 7.6 mg/dL — ABNORMAL LOW (ref 8.9–10.3)
Chloride: 109 mmol/L (ref 101–111)
Creatinine, Ser: 0.94 mg/dL (ref 0.61–1.24)
GFR calc Af Amer: >60 mL/min (ref >60)
GFR calc non Af Amer: >60 mL/min (ref >60)
GLUCOSE: 110 mg/dL — AB (ref 65–99)
POTASSIUM: 3.3 mmol/L — AB (ref 3.5–5.1)
Sodium: 139 mmol/L (ref 135–145)

## 2017-06-09 LAB — CBC WITH DIFFERENTIAL/PLATELET
Basophils Absolute: 0 10*3/uL (ref 0.0–0.1)
Basophils Relative: 0 %
EOS PCT: 1 %
Eosinophils Absolute: 0 10*3/uL (ref 0.0–0.7)
HEMATOCRIT: 24.9 % — AB (ref 39.0–52.0)
Hemoglobin: 8.1 g/dL — ABNORMAL LOW (ref 13.0–17.0)
LYMPHS ABS: 0.5 10*3/uL — AB (ref 0.7–4.0)
Lymphocytes Relative: 29 %
MCH: 30.6 pg (ref 26.0–34.0)
MCHC: 32.5 g/dL (ref 30.0–36.0)
MCV: 94 fL (ref 78.0–100.0)
Monocytes Absolute: 0.2 10*3/uL (ref 0.1–1.0)
Monocytes Relative: 10 %
NEUTROS PCT: 60 %
Neutro Abs: 1 10*3/uL — ABNORMAL LOW (ref 1.7–7.7)
Platelets: 53 10*3/uL — ABNORMAL LOW (ref 150–400)
RBC: 2.65 MIL/uL — AB (ref 4.22–5.81)
RDW: 15.6 % — ABNORMAL HIGH (ref 11.5–15.5)
WBC: 1.7 10*3/uL — AB (ref 4.0–10.5)

## 2017-06-09 LAB — GLUCOSE, CAPILLARY
GLUCOSE-CAPILLARY: 130 mg/dL — AB (ref 65–99)
GLUCOSE-CAPILLARY: 95 mg/dL (ref 65–99)
GLUCOSE-CAPILLARY: 99 mg/dL (ref 65–99)
Glucose-Capillary: 106 mg/dL — ABNORMAL HIGH (ref 65–99)
Glucose-Capillary: 102 mg/dL — ABNORMAL HIGH (ref 65–99)
Glucose-Capillary: 113 mg/dL — ABNORMAL HIGH (ref 65–99)
Glucose-Capillary: 103 mg/dL — ABNORMAL HIGH (ref 65–99)

## 2017-06-09 LAB — MAGNESIUM: MAGNESIUM: 1.1 mg/dL — AB (ref 1.7–2.4)

## 2017-06-09 LAB — ABO/RH: ABO/RH(D): B POS

## 2017-06-09 LAB — PREPARE RBC (CROSSMATCH)

## 2017-06-09 LAB — PROTIME-INR
INR: 1.38
Prothrombin Time: 16.9 seconds — ABNORMAL HIGH (ref 11.4–15.2)

## 2017-06-09 MED ORDER — FUROSEMIDE 10 MG/ML IJ SOLN
20.0000 mg | Freq: Once | INTRAMUSCULAR | Status: AC
  Administered 2017-06-09: 20 mg via INTRAVENOUS
  Filled 2017-06-09: qty 2

## 2017-06-09 MED ORDER — WARFARIN SODIUM 2.5 MG PO TABS
7.5000 mg | ORAL_TABLET | Freq: Once | ORAL | Status: AC
  Administered 2017-06-09: 7.5 mg via ORAL
  Filled 2017-06-09: qty 1

## 2017-06-09 MED ORDER — TBO-FILGRASTIM 480 MCG/0.8ML ~~LOC~~ SOSY
480.0000 ug | PREFILLED_SYRINGE | Freq: Once | SUBCUTANEOUS | Status: AC
  Administered 2017-06-09: 480 ug via SUBCUTANEOUS
  Filled 2017-06-09: qty 0.8

## 2017-06-09 MED ORDER — VANCOMYCIN HCL 10 G IV SOLR: 1500.0000 mg | INTRAVENOUS | Status: DC

## 2017-06-09 MED ORDER — SODIUM CHLORIDE 0.9 % IV SOLN
Freq: Once | INTRAVENOUS | Status: AC
  Administered 2017-06-09: 18:00:00 via INTRAVENOUS

## 2017-06-09 MED ORDER — VANCOMYCIN HCL 10 G IV SOLR
1250.0000 mg | INTRAVENOUS | Status: DC
  Administered 2017-06-10 – 2017-06-12 (×3): 1250 mg via INTRAVENOUS
  Filled 2017-06-09 (×3): qty 1250

## 2017-06-09 MED ORDER — MAGNESIUM SULFATE 4 GM/100ML IV SOLN
4.0000 g | Freq: Once | INTRAVENOUS | Status: AC
  Administered 2017-06-09: 4 g via INTRAVENOUS
  Filled 2017-06-09: qty 100

## 2017-06-09 MED ORDER — VANCOMYCIN HCL 10 G IV SOLR
1500.0000 mg | Freq: Once | INTRAVENOUS | Status: AC
  Administered 2017-06-09: 1500 mg via INTRAVENOUS
  Filled 2017-06-09: qty 1500

## 2017-06-09 NOTE — Consult Note (Signed)
Referral MD  Reason for Referral: Coag negative staph sepsis/urinary tract infection; progressive pancytopenia; history of relapsed non-Hodgkin's lymphoma  Chief Complaint  Patient presents with  . Generalized Weakness  : I was not feeling well.  HPI: Gary Yates is well-known to me.  He is a nice 84 year old white male.  He had recurrent non-Hodgkin's lymphoma.  He had treatment with ICE/Rituxan.  He went into remission by PET scan.  His last treatment was probably 2 or 3 months ago.  His PET scan back in April was negative for any active disease.  He was admitted on the 31st.  He had temperatures.  He had difficulty urinating.  He had a Foley catheter placed by urology..  Urine culture has grown out Staph epi.  I think this is a real organism.   He has had progressive pancytopenia.  When he was admitted, his white cell count was 4.1.  Hemoglobin 8.4.  Platelet count 68,000.  Today, his white cell count is 1.7.  Hemoglobin 8.1.  Platelet count 53,000.  He is on Coumadin.  He has been afebrile.  He has had no bleeding.  He has had some constipation.  There is been no nausea or vomiting.  He has been eating pretty well.  He has severe back issues.  There is been no rashes.  He has chronic lymphedema in his legs.   Past Medical History:  Diagnosis Date  . Anemia in chronic renal disease 05/07/2017  . Anxiety   . Atrial fibrillation ([place])   . COPD (chronic obstructive pulmonary disease) ([place])    pt. denies  . Coronary artery disease    a. h/o Overlapping stents RCA;  b. 06/2011 Cath: patent stents, nonobs dzs, NL EF.  . Diabetic peripheral neuropathy ([place])   . Diffuse non-Hodgkin's lymphoma of testis ([place]) 09/28/2015  . DM (diabetes mellitus) ([place])    Type 2, peripheral neuropathy.  . Dyspnea    with exertion  . Dysrhythmia   . GERD (gastroesophageal reflux disease)   . Headache   . History of bronchitis   . History of kidney stones   . History of radiation therapy 02/19/16 -  03/13/16   Testis/Scrotum: 32.4 Gy in 18 fractions  . History of radiation therapy 08/07/16-08/20/16   left adrenal gland mass treated to 30 Gy in 10 fractions  . Hyperlipidemia   . Hypertension   . Low testosterone   . Nephrolithiasis   . Osteoarthritis    shoulder  . Restless leg   . SVT (supraventricular tachycardia) ([place])   . Urinary frequency   . Wears partial dentures    upper and lower  :  Past Surgical History:  Procedure Laterality Date  . APPENDECTOMY    . [place]  . CARDIAC CATHETERIZATION  01/2013  . CATARACT EXTRACTION, BILATERAL    . CHOLECYSTECTOMY    . COLONOSCOPY    . CORONARY ANGIOPLASTY  2004  . EYE SURGERY Bilateral    cataracts  . IR GENERIC HISTORICAL  10/05/2015   IR US GUIDE VASC ACCESS RIGHT 10/05/2015 Marybelle Killings, MD [facility]-INTERV RAD  . IR GENERIC HISTORICAL  10/05/2015   IR FLUORO GUIDE PORT INSERTION RIGHT 10/05/2015 Marybelle Killings, MD [facility]INTER[facility]  . LEFT HEART CATHETERIZATION WITH CORONARY ANGIOGRAM N/A 06/18/2011   Procedure: LEFT HEART CATHETERIZATION WITH CORONARY ANGIOGRAM;  Surgeon: Peter M Martinique, MD;  Location[facility] CATH LAB;  Service: Cardiovascular;  Laterality: N/A;  . LEFT HEART CATHETERIZATION WITH CORONARY ANGIOGRAM N/A  01/27/2013   Procedure: LEFT HEART CATHETERIZATION WITH CORONARY ANGIOGRAM;  Surgeon: Burnell Blanks, MD;  Location: [facility] CATH LAB;  Service: Cardiovascular;  Laterality: N/A;  . LUMBAR LAMINECTOMY/DECOMPRESSION MICRODISCECTOMY N/A 02/07/2015   Procedure: Lumbar three-Sacral one Decompression;  Surgeon: Kevan Ny Ditty, MD;  Location: [facility] NEURO ORS;  Service: Neurosurgery;  Laterality: N/A;  L3 to S1 Decompression  . MULTIPLE TOOTH EXTRACTIONS    . ORCHIECTOMY Right 09/01/2015   Procedure: RIGHT ORCHIECTOMY;  Surgeon: Kathie Rhodes, MD;  Location: [facility] ORS;  Service: Urology;  Laterality: Right;  . ROTATOR CUFF REPAIR Left   :   Current Facility-Administered Medications:  .  acetaminophen (TYLENOL) tablet 650  mg, 650 mg, Oral, Q6H PRN, 650 mg at 06/09/17 0328 **OR** acetaminophen (TYLENOL) suppository 650 mg, 650 mg, Rectal, Q6H PRN, Mariel Aloe, MD .  atorvastatin (LIPITOR) tablet 40 mg, 40 mg, Oral, Daily, Mariel Aloe, MD, 40 mg at 06/08/17 1127 .  gabapentin (NEURONTIN) capsule 200 mg, 200 mg, Oral, TID, Mariel Aloe, MD, 200 mg at 06/08/17 2157 .  insulin aspart (novoLOG) injection 0-9 Units, 0-9 Units, Subcutaneous, TID WC, Mariel Aloe, MD, 1 Units at 06/07/17 1409 .  insulin glargine (LANTUS) injection 10 Units, 10 Units, Subcutaneous, Daily, Alma Friendly, MD, 10 Units at 06/08/17 1145 .  lidocaine (XYLOCAINE) 2 % viscous mouth solution 15 mL, 15 mL, Mouth/Throat, Q6H PRN, Alma Friendly, MD, 15 mL at 06/07/17 1804 .  magic mouthwash, 5 mL, Oral, QID PRN, Mariel Aloe, MD, 5 mL at 06/07/17 0808 .  Melatonin TABS 1 mg, 1 tablet, Oral, QHS PRN, Mariel Aloe, MD, 1 mg at 06/08/17 2230 .  metoprolol succinate (TOPROL-XL) 24 hr tablet 100 mg, 100 mg, Oral, Daily, Mariel Aloe, MD, 100 mg at 06/08/17 1126 .  omega-3 acid ethyl esters (LOVAZA) capsule 1 g, 1 g, Oral, Daily, Mariel Aloe, MD, 1 g at 06/08/17 1127 .  pantoprazole (PROTONIX) EC tablet 40 mg, 40 mg, Oral, Daily, Mariel Aloe, MD, 40 mg at 06/08/17 1127 .  polyethylene glycol (MIRALAX / GLYCOLAX) packet 17 g, 17 g, Oral, Daily, Alma Friendly, MD, 17 g at 06/08/17 1325 .  pramipexole (MIRAPEX) tablet 3 mg, 3 mg, Oral, QHS, Mariel Aloe, MD, 3 mg at 06/08/17 2157 .  senna-docusate (Senokot-S) tablet 1 tablet, 1 tablet, Oral, BID, Alma Friendly, MD, 1 tablet at 06/08/17 2158 .  sodium chloride flush (NS) 0.9 % injection 10-40 mL, 10-40 mL, Intracatheter, PRN, Mariel Aloe, MD, 10 mL at 06/07/17 0447 .  tamsulosin (FLOMAX) capsule 0.4 mg, 0.4 mg, Oral, QHS, Mariel Aloe, MD, 0.4 mg at 06/08/17 2158 .  Tbo-Filgrastim (GRANIX) injection 480 mcg, 480 mcg, Subcutaneous, Once, Ennever,  Rudell Cobb, MD .  traMADol (ULTRAM) tablet 50 mg, 50 mg, Oral, Q12H PRN, Alma Friendly, MD .  vitamin B-12 (CYANOCOBALAMIN) tablet 1,000 mcg, 1,000 mcg, Oral, Daily, Mariel Aloe, MD, 1,000 mcg at 06/08/17 1126 .  Warfarin - Pharmacist Dosing Inpatient, , Does not apply, q1800, [place], Leann T, RPH  Facility-Administered Medications Ordered in Other Encounters:  .  acetaminophen (TYLENOL) tablet 650 mg, 650 mg, Oral, Once, [place], Sarah M, NP .  pegfilgrastim (NEULASTA ONPRO KIT) injection 6 mg, 6 mg, Subcutaneous, Once, Ennever, Peter R, MD .  sodium chloride flush (NS) 0.9 % injection 10 mL, 10 mL, Intracatheter, PRN, Volanda Napoleon, MD, 10 mL at 02/27/17 1503 .  sodium chloride  flush (NS) 0.9 % injection 10 mL, 10 mL, Intravenous, PRN, Volanda Napoleon, MD, 10 mL at 05/07/17 0938:  . atorvastatin  40 mg Oral Daily  . gabapentin  200 mg Oral TID  . insulin aspart  0-9 Units Subcutaneous TID WC  . insulin glargine  10 Units Subcutaneous Daily  . metoprolol succinate  100 mg Oral Daily  . omega-3 acid ethyl esters  1 g Oral Daily  . pantoprazole  40 mg Oral Daily  . polyethylene glycol  17 g Oral Daily  . pramipexole  3 mg Oral QHS  . senna-docusate  1 tablet Oral BID  . tamsulosin  0.4 mg Oral QHS  . Tbo-Filgrastim  480 mcg Subcutaneous Once  . vitamin B-12  1,000 mcg Oral Daily  . Warfarin - Pharmacist Dosing Inpatient   Does not apply q1800  :  Allergies  Allergen Reactions  . Ace Inhibitors Other (See Comments)    cough  . Codeine Nausea Only and Rash       . Penicillins Rash    Childhood allergy Has patient had a PCN reaction causing immediate rash, facial/tongue/throat swelling, SOB or lightheadedness with hypotension: Yes Has patient had a PCN reaction causing severe rash involving mucus membranes or skin necrosis: Yes Has patient had a PCN reaction that required hospitalization No Has patient had a PCN reaction occurring within the last 10 years: No If  all of the above answers are "NO", then may proceed with Cephalosporin use.   :  Family History  Problem Relation Age of Onset  . Alzheimer's disease Mother   . Heart disease Mother   . Heart disease Father   . Migraines Father   . Ulcers Father   . Prostate cancer Brother   . Coronary artery disease Unknown        Male 1st degree relative <50  . Coronary artery disease Unknown        male 1st degree relative <60  . Heart disease Sister   . Obesity Sister        Morbid  . Arthritis Sister   . Heart disease Brother   . Arthritis Brother   . Sleep apnea Son   . Obesity Son   . Migraines Daughter   . Thyroid disease Daughter   :  Social History   Socioeconomic History  . Marital status: Married    Spouse name: Not on file  . Number of children: 3  . Years of education: Not on file  . Highest education level: Not on file  Occupational History  . Occupation: Retired    Fish farm manager: RETIRED  Social Needs  . Financial resource strain: Not on file  . Food insecurity:    Worry: Not on file    Inability: Not on file  . Transportation needs:    Medical: Not on file    Non-medical: Not on file  Tobacco Use  . Smoking status: Former Smoker    Packs/day: 1.50    Years: 20.00    Pack years: 30.00    Types: Cigarettes    Last attempt to quit: 04/05/1978    Years since quitting: 39.2  . Smokeless tobacco: Never Used  Substance and Sexual Activity  . Alcohol use: No  . Drug use: No  . Sexual activity: Not Currently  Lifestyle  . Physical activity:    Days per week: Not on file    Minutes per session: Not on file  . Stress: Not on file  Relationships  . Social connections:    Talks on phone: Not on file    Gets together: Not on file    Attends religious service: Not on file    Active member of club or organization: Not on file    Attends meetings of clubs or organizations: Not on file    Relationship status: Not on file  . Intimate partner violence:    Fear of  current or ex partner: Not on file    Emotionally abused: Not on file    Physically abused: Not on file    Forced sexual activity: Not on file  Other Topics Concern  . Not on file  Social History Narrative  . Not on file  :  Pertinent items are noted in HPI.  Exam: As above Patient Vitals for the past 24 hrs:  BP Temp Temp src Pulse Resp SpO2  06/09/17 0541 (!) 119/58 98 F (36.7 C) Oral 84 18 96 %  06/08/17 2106 119/65 98.1 F (36.7 C) Oral 85 20 97 %  06/08/17 1345 120/67 98.1 F (36.7 C) Oral 81 18 100 %     Recent Labs    06/08/17 0445 06/09/17 0402  WBC 2.2* 1.7*  HGB 8.1* 8.1*  HCT 25.1* 24.9*  PLT 55* 53*   Recent Labs    06/08/17 0445 06/09/17 0402  NA 138 139  K 3.5 3.3*  CL 109 109  CO2 21* 22  GLUCOSE 97 110*  BUN 12 11  CREATININE 1.07 0.94  CALCIUM 7.8* 7.6*    Blood smear review: None  Pathology: None    Assessment and Plan: Gary Yates is an 84 year old white male.  He has multiple health issues.  He had recurrent non-Hodgkin's lymphoma.  He was in remission by his last PET scan.  He has a Staphylococcus infection.  He has staph epi.  Given that he has a Port-A-Cath in place, this has to be treated as a real infection.  I do not think doxycycline is the way to go.  There is multiple resistance to antibiotics.  It is sensitive to vancomycin.  As such, I think IV vancomycin is appropriate.  He probably needs to have an echocardiogram to make sure that there is no obvious valve infection.  I would not think so since there is no bacteremia.  I think he needs Neupogen for right now.  I also think he needs to be transfused.  Given his cardiac issues, hemoglobin 8.1 is low and I would definitely favor a transfusion and possibly Aranesp.  His platelet count is dropping.  I would continue him on the Coumadin for right now.  We will follow along and try to help out.  I know that he is getting fantastic care from all the staff up on [address].  Lattie Haw, MD  Fara Olden 3:16

## 2017-06-09 NOTE — Progress Notes (Signed)
PROGRESS NOTE  Gary Yates. ION:[id number] DOB: February 03, 1933 DOA: 06/05/2017 PCP: [name], MD  HPI/Recap of past 24 hours: Gary, Yates. is a 84 year old male with medical history significant for paroxysmal A. fib, COPD, CAD, diabetes, non-Hodgkin's/testicular lymphoma, BPH, hyperlipidemia, hypertension, GERD presented to the ER complaining of worsening constipation, worsening dysuria and fever, ongoing for the past 3 days.  Patient tried taking Tylenol as well as his medication for overactive bladder with no effects.  In the ED patient noted to have a fever of 100.4, hypotensive, UA significant for infection.  Patient was also noted to have lower abdominal pain, subsequently bladder scan showed about 800 cc of urine.  Urology was consulted due to difficult Foley insertion.  Patient admitted for further management.  Today, patient reported feeling ok, frustrated about his overall health. Denies any abdominal pain, chest pain, shortness of breath, nausea/vomiting. No BM, agreed for tap water enema. Got a call from RN about blood clot in urine, rec paging Urology, awaiting further recs  Assessment/Plan: Principal Problem:   Sepsis ([place]) Active Problems:   Hyperlipidemia, mixed   Essential hypertension   Diabetic peripheral neuropathy ([place])   Coronary artery disease   COPD (chronic obstructive pulmonary disease) ([place])   Paroxysmal atrial fibrillation ([place])   Poorly controlled type 2 diabetes mellitus with neuropathy (HCC)   Diffuse non-Hodgkin's lymphoma of testis (HCC)  Severe sepsis likely due to UTI Afebrile, no leukocytosis On presentation, patient with fever, tachycardia and hypotension BC x2 NGTD UA showed moderate leukocytes, few bacteria, WBC clumps, WBCs greater than 50 UC grew greater than 100,000 colonies of Staphylococcus specie Switched to IV Vancomycin Monitor closely  Acute urinary retention/BPH/hematuria Ongoing hematuria (urology notified on  06/09/17) Bladder scan with over 800 cc of urine on presentation Likely due to BPH Status post Foley catheter insertion by urology, currently draining henaturia Renal ultrasound showed no mass or hydronephrosis, just bilateral renal cystic changes without acute abnormalities Continue Flomax Urology on board, rec continue foley until follow-up appt in 1 week  Supratherapeutic INR Resolved, now subtherapeutic Patient on Coumadin, INR noted to be 6.91 on admission Due to hematuria reversed with IV vitamin K 2.5 mg Restart Coumadin Daily INR  Pancytopenia Oncology on board rec filgastrim and to transfuse 2 U of PRBC Daily CBC  CKD stage II Creatinine at baseline Daily BMP  Constipation Continue MiraLAX, senna  Anemia of chronic disease Baseline around 9-10, currently around baseline Monitor closely Daily CBC  Paroxysmal A. Fib Rate controlled Continue metoprolol, Coumadin  Diabetes mellitus type 2 Last A1c 6.4 SSI, Accu-Cheks  Non-Hodgkin's/testicular lymphoma In remission Status post chemotherapy  CAD/hyperlipidemia Continue statins  Overactive bladder Hold oxybutynin     Code Status: Full  Family Communication: Wife/daughter at bedside  Disposition Plan: Home once stable   Consultants:  Urology  Oncology  Procedures:  None  Antimicrobials:  IV Vancomycin  DVT prophylaxis: SCDs, coumadin   Objective: Vitals:   06/09/17 1436 06/09/17 1658 06/09/17 1757 06/09/17 1802  BP:  126/63  (!) 113/58  Pulse:  86  86  Resp:  16  16  Temp: 98.6 F (37 C) 99.6 F (37.6 C) 99.8 F (37.7 C)   TempSrc: Oral Oral Oral   SpO2:  94%  94%  Weight:      Height:        Intake/Output Summary (Last 24 hours) at 06/09/2017 1836 Last data filed at 06/09/2017 1831 Gross per 24 hour  Intake 360 ml  Output 4775 ml  Net -4415 ml   Filed Weights   06/05/17 2357 06/06/17 0635  Weight: 102.5 kg (226 lb) 106 kg (233 lb 11 oz)    Exam:   General:  NAD  Cardiovascular: S1, S2 present  Respiratory: Diminished breath sounds bilaterally  Abdomen: Soft, nontender, nondistended, bowel sounds present  Musculoskeletal: Trace bilateral pedal edema  Skin: Normal  Psychiatry: Normal mood   Data Reviewed: CBC: Recent Labs  Lab 06/06/17 0006 06/06/17 2031 06/07/17 0446 06/08/17 0445 06/09/17 0402  WBC 4.0 4.1 3.6* 2.2* 1.7*  NEUTROABS 2.8  --   --  1.5* 1.0*  HGB 8.5* 8.4* 8.2* 8.1* 8.1*  HCT 26.1* 25.6* 24.7* 25.1* 24.9*  MCV 96.0 96.6 95.7 95.8 94.0  PLT 73* 68* 63* 55* 53*   Basic Metabolic Panel: Recent Labs  Lab 06/06/17 0006 06/07/17 0446 06/08/17 0445 06/09/17 0402  NA 139 140 138 139  K 4.2 3.8 3.5 3.3*  CL 111 113* 109 109  CO2 21* 19* 21* 22  GLUCOSE 138* 108* 97 110*  BUN 28* 15 12 11   CREATININE 2.22* 1.27* 1.07 0.94  CALCIUM 7.8* 7.9* 7.8* 7.6*  MG  --   --   --  1.1*   GFR: Estimated Creatinine Clearance: 73.9 mL/min (by C-G formula based on SCr of 0.94 mg/dL). Liver Function Tests: Recent Labs  Lab 06/06/17 0006  AST 18  ALT 9*  ALKPHOS 60  BILITOT 0.4  PROT 5.8*  ALBUMIN 2.6*   No results for input(s): LIPASE, AMYLASE in the last 168 hours. No results for input(s): AMMONIA in the last 168 hours. Coagulation Profile: Recent Labs  Lab 06/06/17 1453 06/07/17 0446 06/08/17 0445 06/09/17 0402  INR 6.91* 6.14* 1.54 1.38   Cardiac Enzymes: No results for input(s): CKTOTAL, CKMB, CKMBINDEX, TROPONINI in the last 168 hours. BNP (last 3 results) No results for input(s): PROBNP in the last 8760 hours. HbA1C: No results for input(s): HGBA1C in the last 72 hours. CBG: Recent Labs  Lab 06/08/17 1631 06/08/17 2202 06/09/17 0643 06/09/17 1258 06/09/17 1642  GLUCAP 104* 106* 102* 113* 99   Lipid Profile: No results for input(s): CHOL, HDL, LDLCALC, TRIG, CHOLHDL, LDLDIRECT in the last 72 hours. Thyroid Function Tests: No results for input(s): TSH, T4TOTAL, FREET4, T3FREE, THYROIDAB  in the last 72 hours. Anemia Panel: No results for input(s): VITAMINB12, FOLATE, FERRITIN, TIBC, IRON, RETICCTPCT in the last 72 hours. Urine analysis:    Component Value Date/Time   COLORURINE YELLOW 06/06/2017 0150   APPEARANCEUR TURBID (A) 06/06/2017 0150   LABSPEC 1.016 06/06/2017 0150   PHURINE 5.0 06/06/2017 0150   GLUCOSEU NEGATIVE 06/06/2017 0150   HGBUR SMALL (A) 06/06/2017 0150   HGBUR trace-intact 12/29/2006 0827   BILIRUBINUR NEGATIVE 06/06/2017 0150   KETONESUR NEGATIVE 06/06/2017 0150   PROTEINUR 100 (A) 06/06/2017 0150   UROBILINOGEN 0.2 12/29/2006 0827   NITRITE NEGATIVE 06/06/2017 0150   LEUKOCYTESUR MODERATE (A) 06/06/2017 0150   Sepsis Labs: @LABRCNTIP (procalcitonin:4,lacticidven:4)  ) Recent Results (from the past 240 hour(s))  Blood Culture (routine x 2)     Status: None (Preliminary result)   Collection Time: 06/06/17 12:06 AM  Result Value Ref Range Status   Specimen Description   Final    BLOOD RIGHT ANTECUBITAL Performed at [facility], [place] [address], [place], [place] [address]    Special Requests   Final    BOTTLES DRAWN AEROBIC AND ANAEROBIC Blood Culture adequate volume Performed at [facility],  [place] [address] [place], [place] [address]    Culture   Final    NO GROWTH 3 DAYS Performed at [facility], [place] [address] [place], [place] [address]    Report Status PENDING  Incomplete  Blood Culture (routine x 2)     Status: None (Preliminary result)   Collection Time: 06/06/17 12:06 AM  Result Value Ref Range Status   Specimen Description   Final    BLOOD PORTA CATH Performed at [place] [address] [place], [place] [address]    Special Requests   Final    BOTTLES DRAWN AEROBIC AND ANAEROBIC Blood Culture adequate volume Performed at [place] [address], [place], [place] [address]    Culture   Final    NO GROWTH 3 DAYS Performed at [facility], [place] [address], [place], [place] [address]    Report Status PENDING  Incomplete  Urine culture     Status: Abnormal   Collection Time: 06/06/17  1:50 AM  Result Value Ref Range Status   Specimen Description   Final    URINE, RANDOM Performed at [place] [address], [place], [place] [address]    Special Requests   Final    NONE Performed at [facility], [place] [address] [place], [place] [address]    Culture (A)  Final    >=100,000 COLONIES/mL STAPHYLOCOCCUS SPECIES (COAGULASE NEGATIVE)   Report Status 06/08/2017 FINAL  Final   Organism ID, Bacteria STAPHYLOCOCCUS SPECIES (COAGULASE NEGATIVE) (A)  Final      Susceptibility   Staphylococcus species (coagulase negative) - MIC*    CIPROFLOXACIN >=8 RESISTANT Resistant     GENTAMICIN <=0.5 SENSITIVE Sensitive     NITROFURANTOIN <=16 SENSITIVE Sensitive     OXACILLIN >=4 RESISTANT Resistant     TETRACYCLINE <=1 SENSITIVE Sensitive     VANCOMYCIN <=0.5 SENSITIVE Sensitive     CLINDAMYCIN RESISTANT Resistant     RIFAMPIN <=0.5 SENSITIVE Sensitive     Inducible Clindamycin POSITIVE Resistant     * >=100,000 COLONIES/mL STAPHYLOCOCCUS SPECIES (COAGULASE NEGATIVE)      Studies: No results found.  Scheduled Meds: . atorvastatin  40 mg Oral Daily  . furosemide  20 mg Intravenous Once  . gabapentin  200 mg Oral TID  . insulin aspart  0-9 Units Subcutaneous TID WC  . insulin glargine  10 Units Subcutaneous Daily  . metoprolol succinate  100 mg Oral Daily  . omega-3 acid ethyl esters  1 g Oral Daily  . pantoprazole  40 mg Oral Daily  . polyethylene glycol  17 g Oral Daily  . pramipexole  3 mg Oral QHS  . senna-docusate  1 tablet Oral BID  . tamsulosin  0.4 mg Oral QHS  . vitamin B-12  1,000 mcg Oral Daily  . Warfarin - Pharmacist Dosing Inpatient   Does not apply q1800    Continuous Infusions: . [START ON 06/10/2017] vancomycin       LOS: 3 days     Alma Friendly, MD [facility]   If 7PM-7AM, please contact night-coverage [email or web address] Password [facility] 06/09/2017, 6:36 PM

## 2017-06-09 NOTE — Progress Notes (Signed)
ANTICOAGULATION CONSULT NOTE - Initial Consult  Pharmacy Consult for Warfarin Indication: atrial fibrillation  Allergies  Allergen Reactions  . Ace Inhibitors Other (See Comments)    cough  . Codeine Nausea Only and Rash       . Penicillins Rash    Childhood allergy Has patient had a PCN reaction causing immediate rash, facial/tongue/throat swelling, SOB or lightheadedness with hypotension: Yes Has patient had a PCN reaction causing severe rash involving mucus membranes or skin necrosis: Yes Has patient had a PCN reaction that required hospitalization No Has patient had a PCN reaction occurring within the last 10 years: No If all of the above answers are "NO", then may proceed with Cephalosporin use.     Patient Measurements: Height: 5\' 10"  (177.8 cm) Weight: 233 lb 11 oz (106 kg) IBW/kg (Calculated) : 73  Vital Signs: Temp: 98 F (36.7 C) (06/03 0541) Temp Source: Oral (06/03 0541) BP: 128/73 (06/03 1113) Pulse Rate: 115 (06/03 1113)  Labs: Recent Labs    06/07/17 0446 06/08/17 0445 06/09/17 0402  HGB 8.2* 8.1* 8.1*  HCT 24.7* 25.1* 24.9*  PLT 63* 55* 53*  LABPROT 54.1* 18.4* 16.9*  INR 6.14* 1.54 1.38  CREATININE 1.27* 1.07 0.94    Estimated Creatinine Clearance: 73.9 mL/min (by C-G formula based on SCr of 0.94 mg/dL).   Medical History: Past Medical History:  Diagnosis Date  . Anemia in chronic renal disease 05/07/2017  . Anxiety   . Atrial fibrillation ([place])   . COPD (chronic obstructive pulmonary disease) ([place])    pt. denies  . Coronary artery disease    a. h/o Overlapping stents RCA;  b. 06/2011 Cath: patent stents, nonobs dzs, NL EF.  . Diabetic peripheral neuropathy ([place])   . Diffuse non-Hodgkin's lymphoma of testis ([place]) 09/28/2015  . DM (diabetes mellitus) ([place])    Type 2, peripheral neuropathy.  . Dyspnea    with exertion  . Dysrhythmia   . GERD (gastroesophageal reflux disease)   . Headache   . History of bronchitis   . History of kidney  stones   . History of radiation therapy 02/19/16 - 03/13/16   Testis/Scrotum: 32.4 Gy in 18 fractions  . History of radiation therapy 08/07/16-08/20/16   left adrenal gland mass treated to 30 Gy in 10 fractions  . Hyperlipidemia   . Hypertension   . Low testosterone   . Nephrolithiasis   . Osteoarthritis    shoulder  . Restless leg   . SVT (supraventricular tachycardia) ([place])   . Urinary frequency   . Wears partial dentures    upper and lower    Medications:  PTA on warfarin 7.5mg  daily except 5mg  Wed  INR upon admission : 6.91  Patient was prescribed ciprofloxacin prior to admission (started taking on 5/22). Drug interaction between ciprofloxacin and warfarin could have contributed to elevated INR on admission  Vitamin K 2.5mg  IV x 1 given on 6/1 for hematuria   Assessment:  84 yr male admitted on 5/31 with sepsis likely due to UTI  PMH significant for AFib  Known chronic low platelet count (PLTC = 53K today)  No hematuria noted today. Hgb 8.1 is stable from yesterday.  Pharmacy consulted to dose warfarin. INR = 1.4 is low today which is to be expected after receiving vitamin K and holding warfarin on 5/31 - 6/1.  Goal of Therapy:  INR 2-3   Plan:   Warfarin 7.5mg  PO x 1 dose today  Daily PT/INR  Theodis Shove, PharmD, BCPS  Clinical Pharmacist Pager: [phone] 06/09/17 12:10 PM

## 2017-06-09 NOTE — Progress Notes (Signed)
Pharmacy Antibiotic Note  Gary Yates. is a 84 y.o. male admitted on 06/05/2017 with UTI.  Pharmacy has been consulted for vancomycin dosing. Patient was on doxycycline for CoNS UTI sensitive to tetracyclines. Antibiotics being changed to vancomycin today - pharmacy consulted to dose.  Plan:  Vancomycin 1500 mg loading dose followed by vancomycin 1250 mg IV q24h  Daily SCr ordered - follow renal function closely  Goal AUC 400-500  Height: 5\' 10"  (177.8 cm) Weight: 233 lb 11 oz (106 kg) IBW/kg (Calculated) : 73  Temp (24hrs), Avg:98.1 F (36.7 C), Min:98 F (36.7 C), Max:98.1 F (36.7 C)  Recent Labs  Lab 06/06/17 0006 06/06/17 0018 06/06/17 2031 06/07/17 0446 06/08/17 0445 06/09/17 0402  WBC 4.0  --  4.1 3.6* 2.2* 1.7*  CREATININE 2.22*  --   --  1.27* 1.07 0.94  LATICACIDVEN  --  1.47  --   --   --   --     Estimated Creatinine Clearance: 73.9 mL/min (by C-G formula based on SCr of 0.94 mg/dL).    Allergies  Allergen Reactions  . Ace Inhibitors Other (See Comments)    cough  . Codeine Nausea Only and Rash       . Penicillins Rash    Childhood allergy Has patient had a PCN reaction causing immediate rash, facial/tongue/throat swelling, SOB or lightheadedness with hypotension: Yes Has patient had a PCN reaction causing severe rash involving mucus membranes or skin necrosis: Yes Has patient had a PCN reaction that required hospitalization No Has patient had a PCN reaction occurring within the last 10 years: No If all of the above answers are "NO", then may proceed with Cephalosporin use.    Antimicrobials this admission: ceftriaxone 5/31 >> 6/2 Doxycycline 6/2  >> 6/3 Vancomycin 6/3 >>  Dose adjustments this admission:  Microbiology results: 5/31 BCx: No growth 2 days 5/31 UCx: >100K CoNS. Sensitive to gentamicin, nitrofurantoin, tetracycline, vancomycin   Thank you for allowing pharmacy to be a part of this patient's care.  Theodis Shove, PharmD,  BCPS Clinical Pharmacist Pager: [phone] 06/09/17 12:04 PM

## 2017-06-09 NOTE — Plan of Care (Signed)
  Problem: Health Behavior/Discharge Planning: Goal: Ability to manage health-related needs will improve Outcome: Progressing   Problem: Nutrition: Goal: Adequate nutrition will be maintained Outcome: Progressing   Problem: Elimination: Goal: Will not experience complications related to urinary retention Outcome: Progressing   Problem: Safety: Goal: Ability to remain free from injury will improve Outcome: Progressing   Problem: Skin Integrity: Goal: Risk for impaired skin integrity will decrease Outcome: Progressing

## 2017-06-09 NOTE — Progress Notes (Signed)
PT Cancellation Note  Patient Details Name: Gary Yates. MRN: [id number] DOB: 03/29/1933   Cancelled Treatment:     NT reported pt was dizzy and BP dropped during ADL's this morning.  Will check back another time/day as schedule permits.  Pt has been evaluated with rec for HHPT HH Aide.     Rica Koyanagi  PTA [facility]  Acute  Rehab Pager      [phone]

## 2017-06-10 ENCOUNTER — Ambulatory Visit: Payer: Medicare Other | Admitting: Family Medicine

## 2017-06-10 ENCOUNTER — Inpatient Hospital Stay (HOSPITAL_COMMUNITY): Payer: Medicare Other

## 2017-06-10 DIAGNOSIS — I361 Nonrheumatic tricuspid (valve) insufficiency: Secondary | ICD-10-CM

## 2017-06-10 DIAGNOSIS — N390 Urinary tract infection, site not specified: Secondary | ICD-10-CM

## 2017-06-10 LAB — BPAM RBC
Blood Product Expiration Date: 201906152359
Blood Product Expiration Date: 201906202359
ISSUE DATE / TIME: 201906031810
ISSUE DATE / TIME: 201906032102
Unit Type and Rh: 7300
Unit Type and Rh: 7300

## 2017-06-10 LAB — BASIC METABOLIC PANEL
Anion gap: 12 (ref 5–15)
BUN: 12 mg/dL (ref 6–20)
CALCIUM: 8.1 mg/dL — AB (ref 8.9–10.3)
CHLORIDE: 103 mmol/L (ref 101–111)
CO2: 24 mmol/L (ref 22–32)
CREATININE: 1.03 mg/dL (ref 0.61–1.24)
GFR calc non Af Amer: >60 mL/min (ref >60)
GLUCOSE: 129 mg/dL — AB (ref 65–99)
Potassium: 3 mmol/L — ABNORMAL LOW (ref 3.5–5.1)
Sodium: 139 mmol/L (ref 135–145)

## 2017-06-10 LAB — CBC WITH DIFFERENTIAL/PLATELET
Basophils Absolute: 0 K/uL (ref 0.0–0.1)
Basophils Relative: 0 %
Eosinophils Absolute: 0.1 K/uL (ref 0.0–0.7)
Eosinophils Relative: 1 %
HCT: 32.1 % — ABNORMAL LOW (ref 39.0–52.0)
Hemoglobin: 10.7 g/dL — ABNORMAL LOW (ref 13.0–17.0)
Lymphocytes Relative: 10 %
Lymphs Abs: 0.7 K/uL (ref 0.7–4.0)
MCH: 30.7 pg (ref 26.0–34.0)
MCHC: 33.3 g/dL (ref 30.0–36.0)
MCV: 92.2 fL (ref 78.0–100.0)
Monocytes Absolute: 0.3 K/uL (ref 0.1–1.0)
Monocytes Relative: 5 %
Neutro Abs: 5.6 K/uL (ref 1.7–7.7)
Neutrophils Relative %: 84 %
Platelets: 53 K/uL — ABNORMAL LOW (ref 150–400)
RBC: 3.48 MIL/uL — ABNORMAL LOW (ref 4.22–5.81)
RDW: 15.9 % — ABNORMAL HIGH (ref 11.5–15.5)
WBC: 6.7 K/uL (ref 4.0–10.5)

## 2017-06-10 LAB — PROTIME-INR
INR: 1.47
Prothrombin Time: 17.7 s — ABNORMAL HIGH (ref 11.4–15.2)

## 2017-06-10 LAB — TYPE AND SCREEN
ABO/RH(D): B POS
Antibody Screen: NEGATIVE
Unit division: 0
Unit division: 0

## 2017-06-10 LAB — GLUCOSE, CAPILLARY
GLUCOSE-CAPILLARY: 112 mg/dL — AB (ref 65–99)
Glucose-Capillary: 168 mg/dL — ABNORMAL HIGH (ref 65–99)
Glucose-Capillary: 156 mg/dL — ABNORMAL HIGH (ref 65–99)
Glucose-Capillary: 132 mg/dL — ABNORMAL HIGH (ref 65–99)

## 2017-06-10 LAB — ECHOCARDIOGRAM COMPLETE
HEIGHTINCHES: 70 in
WEIGHTICAEL: 3513.25 [oz_av]

## 2017-06-10 LAB — MAGNESIUM: MAGNESIUM: 1.5 mg/dL — AB (ref 1.7–2.4)

## 2017-06-10 MED ORDER — MAGNESIUM CITRATE PO SOLN
1.0000 | Freq: Once | ORAL | Status: AC
  Administered 2017-06-10: 1 via ORAL
  Filled 2017-06-10: qty 296

## 2017-06-10 MED ORDER — POTASSIUM CHLORIDE CRYS ER 20 MEQ PO TBCR
40.0000 meq | EXTENDED_RELEASE_TABLET | Freq: Once | ORAL | Status: AC
  Administered 2017-06-10: 40 meq via ORAL
  Filled 2017-06-10: qty 2

## 2017-06-10 MED ORDER — ROMIPLOSTIM 250 MCG ~~LOC~~ SOLR
2.0000 ug/kg | Freq: Once | SUBCUTANEOUS | Status: AC
  Administered 2017-06-10: 210 ug via SUBCUTANEOUS
  Filled 2017-06-10: qty 0.42

## 2017-06-10 MED ORDER — MAGNESIUM SULFATE 4 GM/100ML IV SOLN
4.0000 g | Freq: Once | INTRAVENOUS | Status: AC
  Administered 2017-06-10: 4 g via INTRAVENOUS
  Filled 2017-06-10: qty 100

## 2017-06-10 MED ORDER — WARFARIN SODIUM 5 MG PO TABS
7.5000 mg | ORAL_TABLET | Freq: Once | ORAL | Status: AC
  Administered 2017-06-10: 7.5 mg via ORAL
  Filled 2017-06-10: qty 1

## 2017-06-10 NOTE — Progress Notes (Signed)
[name] is feeling better.  He got 2 units of blood last night.  I gave him a dose of Neupogen.  This really has helped his blood counts.  His white cell count is now 6.7.  Hemoglobin 10.7.  Platelet count 53,000.  He still has some blood in the urine.  He has prostate issues.  I suspect he is going to need to have some type of treatment for his enlarged prostate.  Hopefully, urology will see him today.  I will give him a dose of Nplate for his thrombocytopenia.  He is on vancomycin.  I think vancomycin is appropriate for the Staph epi urinary tract infection.  This man has a Port-A-Cath in.  The last thing we need is for this Staph epi to get into his blood and then to see if his Port-A-Cath which we then put him at a significant risk for endocarditis.  I think it would be a good idea to do an echocardiogram so that we can just check his heart valves.  I know that he does not have bacteremia right now.  His appetite is doing a little bit better.  He was out of bed yesterday.  He has had no fever.  Overall, there really is no change in his physical exam.  His "color" does look better.  I would think that his hemoglobin will continue to improve.  He just got the blood late last night.  I do not think it will equilibrate for another day or so.  Again, I do not think that this Staph epi in his blood should be taken "lightly."  He has the Port-A-Cath in which I think is dictating how this UTI needs to be treated.  Lattie Haw, MD  Darlyn Chamber 17:14

## 2017-06-10 NOTE — Care Management Note (Signed)
Case Management Note  Patient Details  Name: Gary Yates. MRN: [id number] Date of Birth: 1933/11/21  Subjective/Objective:  PT recc HHPT,aide-[facility] rep Sharyn Lull already following-awaiting HHPT,face to face order. Primary contact for HHC-Lori Noyes(dtr) c#[phone].                  Action/Plan:d/c home w/HHC.   Expected Discharge Date:                  Expected Discharge Plan:  [place]  In-House Referral:     Discharge planning Services  CM Consult  Post Acute Care Choice:  Home Health(Active w/[facility].) Choice offered to:     DME Arranged:    DME Agency:     HH Arranged:    [place] Agency:     Status of Service:  In process, will continue to follow  If discussed at Long Length of Stay Meetings, dates discussed:    Additional Comments:  Dessa Phi, RN 06/10/2017, 4:01 PM

## 2017-06-10 NOTE — Plan of Care (Signed)
  Problem: Health Behavior/Discharge Planning: Goal: Ability to manage health-related needs will improve Outcome: Progressing   Problem: Clinical Measurements: Goal: Ability to maintain clinical measurements within normal limits will improve Outcome: Progressing Goal: Will remain free from infection Outcome: Progressing Goal: Diagnostic test results will improve Outcome: Progressing Goal: Respiratory complications will improve Outcome: Progressing Goal: Cardiovascular complication will be avoided Outcome: Progressing   Problem: Nutrition: Goal: Adequate nutrition will be maintained Outcome: Progressing   Problem: Safety: Goal: Ability to remain free from injury will improve Outcome: Progressing   Problem: Skin Integrity: Goal: Risk for impaired skin integrity will decrease Outcome: Progressing

## 2017-06-10 NOTE — Progress Notes (Signed)
PROGRESS NOTE  Gary Yates. ACZ:[id number] DOB: December 28, 1933 DOA: 06/05/2017 PCP: [name], MD  HPI/Recap of past 24 hours: Gary, Yates. is a 84 year old male with medical history significant for paroxysmal A. fib, COPD, CAD, diabetes, non-Hodgkin's/testicular lymphoma, BPH, hyperlipidemia, hypertension, GERD presented to the ER complaining of worsening constipation, worsening dysuria and fever, ongoing for the past 3 days.  Patient tried taking Tylenol as well as his medication for overactive bladder with no effects.  In the ED patient noted to have a fever of 100.4, hypotensive, UA significant for infection.  Patient was also noted to have lower abdominal pain, subsequently bladder scan showed about 800 cc of urine.  Urology was consulted due to difficult Foley insertion.  Patient admitted for further management.  Today, patient reported feeling better, still constipated. Denies any abdominal pain, chest pain, shortness of breath, nausea/vomiting.    Assessment/Plan: Principal Problem:   Sepsis ([place]) Active Problems:   Hyperlipidemia, mixed   Essential hypertension   Diabetic peripheral neuropathy (HCC)   Coronary artery disease   COPD (chronic obstructive pulmonary disease) (HCC)   Paroxysmal atrial fibrillation (HCC)   Poorly controlled type 2 diabetes mellitus with neuropathy (HCC)   Diffuse non-Hodgkin's lymphoma of testis (HCC)  Severe sepsis likely due to UTI Afebrile, no leukocytosis On presentation, patient with fever, tachycardia and hypotension BC x2 NGTD UA showed moderate leukocytes, few bacteria, WBC clumps, WBCs greater than 50 UC grew greater than 100,000 colonies of Staphylococcus specie Switched to IV Vancomycin Monitor closely  Acute urinary retention/BPH/hematuria Intermittent hematuria Bladder scan with over 800 cc of urine on presentation Likely due to BPH Status post Foley catheter insertion by urology, currently draining  henaturia Renal ultrasound showed no mass or hydronephrosis, just bilateral renal cystic changes without acute abnormalities Continue Flomax Urology on board, rec continue foley until follow-up appt in 1 week  Supratherapeutic INR Resolved, now subtherapeutic Patient on Coumadin, INR noted to be 6.91 on admission Due to hematuria reversed with IV vitamin K 2.5 mg Restart Coumadin Daily INR  Pancytopenia Oncology on board S/P filgastrim and 2 U of PRBC on 06/09/17 Daily CBC  Hypokalemia/Hypomagnesemia Replace prn  CKD stage II Creatinine at baseline Daily BMP  Constipation Continue MiraLAX, senna, Mg citrate, tap water enema  Anemia of chronic disease Baseline around 9-10, currently around baseline Monitor closely Daily CBC  Paroxysmal A. Fib Rate controlled Continue metoprolol, Coumadin  Diabetes mellitus type 2 Last A1c 6.4 SSI, Accu-Cheks  Non-Hodgkin's/testicular lymphoma In remission Status post chemotherapy  CAD/hyperlipidemia Continue statins  Overactive bladder Hold oxybutynin     Code Status: Full  Family Communication: Son at bedside  Disposition Plan: Home once stable   Consultants:  Urology  Oncology  Procedures:  None  Antimicrobials:  IV Vancomycin  DVT prophylaxis: SCDs, coumadin   Objective: Vitals:   06/09/17 2349 06/10/17 0420 06/10/17 0832 06/10/17 1357  BP: 116/60 (!) 116/58  116/64  Pulse: 79 82  70  Resp: 20 18  18   Temp: 99.9 F (37.7 C) 98.9 F (37.2 C)  98 F (36.7 C)  TempSrc: Oral Oral  Oral  SpO2: 94% 95%  96%  Weight:   99.6 kg (219 lb 9.3 oz)   Height:        Intake/Output Summary (Last 24 hours) at 06/10/2017 1722 Last data filed at 06/10/2017 1356 Gross per 24 hour  Intake 1206 ml  Output 2225 ml  Net -1019 ml   Filed Weights   06/05/17  2357 06/06/17 0635 06/10/17 0832  Weight: 102.5 kg (226 lb) 106 kg (233 lb 11 oz) 99.6 kg (219 lb 9.3 oz)    Exam:   General: NAD  Cardiovascular:  S1, S2 present  Respiratory: Diminished breath sounds bilaterally  Abdomen: Soft, nontender, nondistended, bowel sounds present  Musculoskeletal: Trace bilateral pedal edema  Skin: Normal  Psychiatry: Normal mood   Data Reviewed: CBC: Recent Labs  Lab 06/06/17 0006 06/06/17 2031 06/07/17 0446 06/08/17 0445 06/09/17 0402 06/10/17 0408  WBC 4.0 4.1 3.6* 2.2* 1.7* 6.7  NEUTROABS 2.8  --   --  1.5* 1.0* 5.6  HGB 8.5* 8.4* 8.2* 8.1* 8.1* 10.7*  HCT 26.1* 25.6* 24.7* 25.1* 24.9* 32.1*  MCV 96.0 96.6 95.7 95.8 94.0 92.2  PLT 73* 68* 63* 55* 53* 53*   Basic Metabolic Panel: Recent Labs  Lab 06/06/17 0006 06/07/17 0446 06/08/17 0445 06/09/17 0402 06/10/17 0408  NA 139 140 138 139 139  K 4.2 3.8 3.5 3.3* 3.0*  CL 111 113* 109 109 103  CO2 21* 19* 21* 22 24  GLUCOSE 138* 108* 97 110* 129*  BUN 28* 15 12 11 12   CREATININE 2.22* 1.27* 1.07 0.94 1.03  CALCIUM 7.8* 7.9* 7.8* 7.6* 8.1*  MG  --   --   --  1.1* 1.5*   GFR: Estimated Creatinine Clearance: 65.4 mL/min (by C-G formula based on SCr of 1.03 mg/dL). Liver Function Tests: Recent Labs  Lab 06/06/17 0006  AST 18  ALT 9*  ALKPHOS 60  BILITOT 0.4  PROT 5.8*  ALBUMIN 2.6*   No results for input(s): LIPASE, AMYLASE in the last 168 hours. No results for input(s): AMMONIA in the last 168 hours. Coagulation Profile: Recent Labs  Lab 06/06/17 1453 06/07/17 0446 06/08/17 0445 06/09/17 0402 06/10/17 0408  INR 6.91* 6.14* 1.54 1.38 1.47   Cardiac Enzymes: No results for input(s): CKTOTAL, CKMB, CKMBINDEX, TROPONINI in the last 168 hours. BNP (last 3 results) No results for input(s): PROBNP in the last 8760 hours. HbA1C: No results for input(s): HGBA1C in the last 72 hours. CBG: Recent Labs  Lab 06/09/17 1642 06/09/17 2204 06/10/17 0741 06/10/17 1134 06/10/17 1651  GLUCAP 99 103* 112* 168* 156*   Lipid Profile: No results for input(s): CHOL, HDL, LDLCALC, TRIG, CHOLHDL, LDLDIRECT in the last 72  hours. Thyroid Function Tests: No results for input(s): TSH, T4TOTAL, FREET4, T3FREE, THYROIDAB in the last 72 hours. Anemia Panel: No results for input(s): VITAMINB12, FOLATE, FERRITIN, TIBC, IRON, RETICCTPCT in the last 72 hours. Urine analysis:    Component Value Date/Time   COLORURINE YELLOW 06/06/2017 0150   APPEARANCEUR TURBID (A) 06/06/2017 0150   LABSPEC 1.016 06/06/2017 0150   PHURINE 5.0 06/06/2017 0150   GLUCOSEU NEGATIVE 06/06/2017 0150   HGBUR SMALL (A) 06/06/2017 0150   HGBUR trace-intact 12/29/2006 0827   BILIRUBINUR NEGATIVE 06/06/2017 0150   KETONESUR NEGATIVE 06/06/2017 0150   PROTEINUR 100 (A) 06/06/2017 0150   UROBILINOGEN 0.2 12/29/2006 0827   NITRITE NEGATIVE 06/06/2017 0150   LEUKOCYTESUR MODERATE (A) 06/06/2017 0150   Sepsis Labs: @LABRCNTIP (procalcitonin:4,lacticidven:4)  ) Recent Results (from the past 240 hour(s))  Blood Culture (routine x 2)     Status: None (Preliminary result)   Collection Time: 06/06/17 12:06 AM  Result Value Ref Range Status   Specimen Description   Final    BLOOD RIGHT ANTECUBITAL Performed at [facility], [place] [address] [place], [place] [address]    Special Requests   Final  BOTTLES DRAWN AEROBIC AND ANAEROBIC Blood Culture adequate volume Performed at [facility] [address], [place], [place] [address]    Culture   Final    NO GROWTH 4 DAYS Performed at [facility], [place] [address], [place], [place] [address]    Report Status PENDING  Incomplete  Blood Culture (routine x 2)     Status: None (Preliminary result)   Collection Time: 06/06/17 12:06 AM  Result Value Ref Range Status   Specimen Description   Final    BLOOD PORTA CATH Performed at [facility] [address] [place], [place] [address]    Special Requests   Final    BOTTLES DRAWN AEROBIC AND ANAEROBIC Blood Culture adequate volume Performed at [facility]  [address] [place], [place] [address]    Culture   Final    NO GROWTH 4 DAYS Performed at [facility], [place] [address] [place], [place] [address]    Report Status PENDING  Incomplete  Urine culture     Status: Abnormal   Collection Time: 06/06/17  1:50 AM  Result Value Ref Range Status   Specimen Description   Final    URINE, RANDOM Performed at [facility] [address], [place], [place] [address]    Special Requests   Final    NONE Performed at [facility] [place] [address], [place], [place] [address]    Culture (A)  Final    >=100,000 COLONIES/mL STAPHYLOCOCCUS SPECIES (COAGULASE NEGATIVE)   Report Status 06/08/2017 FINAL  Final   Organism ID, Bacteria STAPHYLOCOCCUS SPECIES (COAGULASE NEGATIVE) (A)  Final      Susceptibility   Staphylococcus species (coagulase negative) - MIC*    CIPROFLOXACIN >=8 RESISTANT Resistant     GENTAMICIN <=0.5 SENSITIVE Sensitive     NITROFURANTOIN <=16 SENSITIVE Sensitive     OXACILLIN >=4 RESISTANT Resistant     TETRACYCLINE <=1 SENSITIVE Sensitive     VANCOMYCIN <=0.5 SENSITIVE Sensitive     CLINDAMYCIN RESISTANT Resistant     RIFAMPIN <=0.5 SENSITIVE Sensitive     Inducible Clindamycin POSITIVE Resistant     * >=100,000 COLONIES/mL STAPHYLOCOCCUS SPECIES (COAGULASE NEGATIVE)      Studies: No results found.  Scheduled Meds: . atorvastatin  40 mg Oral Daily  . gabapentin  200 mg Oral TID  . insulin aspart  0-9 Units Subcutaneous TID WC  . insulin glargine  10 Units Subcutaneous Daily  . metoprolol succinate  100 mg Oral Daily  . omega-3 acid ethyl esters  1 g Oral Daily  . pantoprazole  40 mg Oral Daily  . polyethylene glycol  17 g Oral Daily  . pramipexole  3 mg Oral QHS  . senna-docusate  1 tablet Oral BID  . tamsulosin  0.4 mg Oral QHS  . vitamin B-12  1,000 mcg Oral Daily  . warfarin  7.5 mg Oral ONCE-1800  . Warfarin - Pharmacist Dosing Inpatient   Does not apply q1800     Continuous Infusions: . vancomycin Stopped (06/10/17 1008)     LOS: 4 days     Alma Friendly, MD [facility]   If 7PM-7AM, please contact night-coverage [email or web address] Password [email or web address] 06/10/2017, 5:22 PM

## 2017-06-10 NOTE — Progress Notes (Signed)
  Echocardiogram 2D Echocardiogram has been performed.  Madelaine Etienne 06/10/2017, 3:12 PM

## 2017-06-10 NOTE — Progress Notes (Signed)
PT Cancellation Note  Patient Details Name: Gary Yates. MRN: [id number] DOB: 01-Jan-1934   Cancelled Treatment:     attempted to see x 3 1. Just got back to bed was in recliner several hours and wasnted to rest 2. Was given a Laxative 30 min ago and requested I come back later 3. Tech in room performing ECHO  Pt has been evaluated with rec for HHPT and [place]  PTA [facility]  Acute  Rehab Pager      [phone]

## 2017-06-10 NOTE — Progress Notes (Signed)
ANTICOAGULATION CONSULT NOTE - Initial Consult  Pharmacy Consult for Warfarin Indication: atrial fibrillation  Allergies  Allergen Reactions  . Ace Inhibitors Other (See Comments)    cough  . Codeine Nausea Only and Rash       . Penicillins Rash    Childhood allergy Has patient had a PCN reaction causing immediate rash, facial/tongue/throat swelling, SOB or lightheadedness with hypotension: Yes Has patient had a PCN reaction causing severe rash involving mucus membranes or skin necrosis: Yes Has patient had a PCN reaction that required hospitalization No Has patient had a PCN reaction occurring within the last 10 years: No If all of the above answers are "NO", then may proceed with Cephalosporin use.     Patient Measurements: Height: 5\' 10"  (177.8 cm) Weight: 219 lb 9.3 oz (99.6 kg) IBW/kg (Calculated) : 73  Vital Signs: Temp: 98.9 F (37.2 C) (06/04 0420) Temp Source: Oral (06/04 0420) BP: 116/58 (06/04 0420) Pulse Rate: 82 (06/04 0420)  Labs: Recent Labs    06/08/17 0445 06/09/17 0402 06/10/17 0408  HGB 8.1* 8.1* 10.7*  HCT 25.1* 24.9* 32.1*  PLT 55* 53* 53*  LABPROT 18.4* 16.9* 17.7*  INR 1.54 1.38 1.47  CREATININE 1.07 0.94 1.03    Estimated Creatinine Clearance: 65.4 mL/min (by C-G formula based on SCr of 1.03 mg/dL).   Medical History: Past Medical History:  Diagnosis Date  . Anemia in chronic renal disease 05/07/2017  . Anxiety   . Atrial fibrillation ([place])   . COPD (chronic obstructive pulmonary disease) ([place])    pt. denies  . Coronary artery disease    a. h/o Overlapping stents RCA;  b. 06/2011 Cath: patent stents, nonobs dzs, NL EF.  . Diabetic peripheral neuropathy ([place])   . Diffuse non-Hodgkin's lymphoma of testis ([place]) 09/28/2015  . DM (diabetes mellitus) ([place])    Type 2, peripheral neuropathy.  . Dyspnea    with exertion  . Dysrhythmia   . GERD (gastroesophageal reflux disease)   . Headache   . History of bronchitis   . History of  kidney stones   . History of radiation therapy 02/19/16 - 03/13/16   Testis/Scrotum: 32.4 Gy in 18 fractions  . History of radiation therapy 08/07/16-08/20/16   left adrenal gland mass treated to 30 Gy in 10 fractions  . Hyperlipidemia   . Hypertension   . Low testosterone   . Nephrolithiasis   . Osteoarthritis    shoulder  . Restless leg   . SVT (supraventricular tachycardia) ([place])   . Urinary frequency   . Wears partial dentures    upper and lower    Medications:  PTA on warfarin 7.5mg  daily except 5mg  Wed  INR upon admission : 6.91  Patient was prescribed ciprofloxacin prior to admission (started taking on 5/22). Drug interaction between ciprofloxacin and warfarin could have contributed to elevated INR on admission  Vitamin K 2.5mg  IV x 1 given on 6/1 for hematuria   Assessment: 84 yr male admitted on 5/31 with sepsis likely due to UTI.  PMH significant for AFib for which he takes chronic Coumadin.  Known chronic low platelet count.  INR elevated on admission likely due to Cipro interaction.  Coumadin held 5/31, 6/1.  S/P Vit K 2.5mg  on 6/1.  06/10/2017:  INR 1.47- slowly rising to goal   Hgb improved to 10.7 (s/p PRBC & Granix 6/3)  No bleeding noted  Eating ~25% CHO mod diet  DDI: on Vancomycin  Goal of Therapy:  INR 2-3  Plan:   Warfarin 7.5mg  PO x 1 dose today  Daily PT/INR  Netta Cedars, PharmD, BCPS Pager: [phone] 06/10/17 @ 10:43 AM

## 2017-06-11 ENCOUNTER — Inpatient Hospital Stay (HOSPITAL_COMMUNITY): Payer: Medicare Other

## 2017-06-11 DIAGNOSIS — A408 Other streptococcal sepsis: Secondary | ICD-10-CM

## 2017-06-11 DIAGNOSIS — M79609 Pain in unspecified limb: Secondary | ICD-10-CM

## 2017-06-11 DIAGNOSIS — M7989 Other specified soft tissue disorders: Secondary | ICD-10-CM

## 2017-06-11 LAB — CBC WITH DIFFERENTIAL/PLATELET
Basophils Absolute: 0 10*3/uL (ref 0.0–0.1)
Basophils Relative: 0 %
EOS ABS: 0.1 10*3/uL (ref 0.0–0.7)
EOS PCT: 1 %
HCT: 30.9 % — ABNORMAL LOW (ref 39.0–52.0)
HEMOGLOBIN: 10.4 g/dL — AB (ref 13.0–17.0)
LYMPHS ABS: 0.7 10*3/uL (ref 0.7–4.0)
Lymphocytes Relative: 14 %
MCH: 31 pg (ref 26.0–34.0)
MCHC: 33.7 g/dL (ref 30.0–36.0)
MCV: 92.2 fL (ref 78.0–100.0)
MONOS PCT: 5 %
Monocytes Absolute: 0.3 10*3/uL (ref 0.1–1.0)
Neutro Abs: 3.7 10*3/uL (ref 1.7–7.7)
Neutrophils Relative %: 80 %
Platelets: 54 10*3/uL — ABNORMAL LOW (ref 150–400)
RBC: 3.35 MIL/uL — ABNORMAL LOW (ref 4.22–5.81)
RDW: 16.2 % — ABNORMAL HIGH (ref 11.5–15.5)
WBC: 4.7 10*3/uL (ref 4.0–10.5)

## 2017-06-11 LAB — PROTIME-INR
INR: 1.86
PROTHROMBIN TIME: 21.3 s — AB (ref 11.4–15.2)

## 2017-06-11 LAB — MAGNESIUM: Magnesium: 2 mg/dL (ref 1.7–2.4)

## 2017-06-11 LAB — BASIC METABOLIC PANEL
Anion gap: 7 (ref 5–15)
BUN: 14 mg/dL (ref 6–20)
CHLORIDE: 105 mmol/L (ref 101–111)
CO2: 29 mmol/L (ref 22–32)
Calcium: 8.4 mg/dL — ABNORMAL LOW (ref 8.9–10.3)
Creatinine, Ser: 0.91 mg/dL (ref 0.61–1.24)
GFR calc Af Amer: >60 mL/min (ref >60)
GFR calc non Af Amer: >60 mL/min (ref >60)
GLUCOSE: 116 mg/dL — AB (ref 65–99)
Potassium: 3.7 mmol/L (ref 3.5–5.1)
SODIUM: 141 mmol/L (ref 135–145)

## 2017-06-11 LAB — GLUCOSE, CAPILLARY
GLUCOSE-CAPILLARY: 117 mg/dL — AB (ref 65–99)
Glucose-Capillary: 123 mg/dL — ABNORMAL HIGH (ref 65–99)
Glucose-Capillary: 164 mg/dL — ABNORMAL HIGH (ref 65–99)
Glucose-Capillary: 147 mg/dL — ABNORMAL HIGH (ref 65–99)

## 2017-06-11 LAB — CULTURE, BLOOD (ROUTINE X 2)
Culture: NO GROWTH
Culture: NO GROWTH
SPECIAL REQUESTS: ADEQUATE
SPECIAL REQUESTS: ADEQUATE

## 2017-06-11 MED ORDER — GABAPENTIN 400 MG PO CAPS
400.0000 mg | ORAL_CAPSULE | Freq: Three times a day (TID) | ORAL | Status: DC
  Administered 2017-06-11 – 2017-06-12 (×3): 400 mg via ORAL
  Filled 2017-06-11 (×3): qty 1

## 2017-06-11 MED ORDER — WARFARIN SODIUM 5 MG PO TABS
5.0000 mg | ORAL_TABLET | Freq: Once | ORAL | Status: AC
  Administered 2017-06-11: 5 mg via ORAL
  Filled 2017-06-11: qty 1

## 2017-06-11 NOTE — Progress Notes (Signed)
Mr. Durnin is sitting in a chair this morning.  He is complaining of significant pain in his lower legs.  He is on Coumadin.  I do think that we have to check him for thromboembolic disease.  He has a hematologic malignancy.  He has been fairly immobile for a while.  He still has the Foley catheter in.  I am not sure if this is going to be taken out.  He is on vancomycin for the staph epi UTI.  I probably would repeat the urine to see if the UTI is gone.  His labs today shows white cell count to be 4.7.  Hemoglobin 10.4.  Platelet count 54,000.  His electrolytes all look fine.  His appetite is very poor.  He hates the food in the hospital.  I told his son to bring him in some food.  He did have an echocardiogram done.  He had mild tricuspid regurgitation.  He hadYesterday.  Everything looked fine with his heart valves some aortic stenosis which was very mild.  His ejection fraction was 55-60%.  No obvious vegetations were noted.  There really is no change on his physical exam.  Hopefully, he will be able to go home soon.  Lattie Haw, MD  [place]brew[place] 4:16

## 2017-06-11 NOTE — Progress Notes (Signed)
Physical Therapy Treatment Patient Details Name: Gary Yates. MRN: [id number] DOB: 15-Aug-1933 Today's Date: 06/11/2017    History of Present Illness Gary Yates. is a 84 y.o. male with medical history significant of paroxysmal atrial fibrillation, COPD, CAD, diabetes, non-Hodgkin's lymphoma status post chemo and radiation therapy, hyperlipidemia, hypertension, GERD.  Admitted with fever and sepsis and urinary retention with hematuria.    PT Comments    Assisted OOB to amb an increased distance.  Pt c/o B LE pain/swelling "neuropathy" with noted edema.  Pt hopse to D/C to home tomorrow.   Follow Up Recommendations  Home health PT     Equipment Recommendations  None recommended by PT    Recommendations for Other Services       Precautions / Restrictions Precautions Precautions: Fall Restrictions Weight Bearing Restrictions: No    Mobility  Bed Mobility Overal bed mobility: Needs Assistance Bed Mobility: Supine to Sit     Supine to sit: HOB elevated;Supervision     General bed mobility comments: increased time and effort use and rails  Transfers Overall transfer level: Needs assistance Equipment used: Rolling walker (2 wheeled) Transfers: Sit to/from Stand Sit to Stand: From elevated surface;Min guard         General transfer comment: for balance, difficutly from regular height so height of bed elevated  Ambulation/Gait Ambulation/Gait assistance: Supervision;Min guard Ambulation Distance (Feet): 140 Feet Assistive device: Rolling walker (2 wheeled)(pt uses a 4 WW at home) Gait Pattern/deviations: Step-through pattern;Decreased stride length;Trunk flexed Gait velocity: decreased   General Gait Details: tolerated an increased distance   Marine scientist [place] (Stroke Patients Only)       Balance                                            Cognition Arousal/Alertness:  Awake/alert Behavior During Therapy: WFL for tasks assessed/performed Overall Cognitive Status: Within Functional Limits for tasks assessed                                        Exercises      General Comments        Pertinent Vitals/Pain Pain Assessment: Faces Pain Location: B LE "Neuropathy" Pain Descriptors / Indicators: Discomfort;Grimacing Pain Intervention(s): Monitored during session;Repositioned    Home Living                      Prior Function            PT Goals (current goals can now be found in the care plan section) Progress towards PT goals: Progressing toward goals    Frequency    Min 3X/week      PT Plan Current plan remains appropriate    Co-evaluation              AM-PAC PT "6 Clicks" Daily Activity  Outcome Measure  Difficulty turning over in bed (including adjusting bedclothes, sheets and blankets)?: A Little Difficulty moving from lying on back to sitting on the side of the bed? : A Lot Difficulty sitting down on and standing up from a chair with arms (e.g., wheelchair, bedside commode, etc,.)?: Unable Help needed moving to and  from a bed to chair (including a wheelchair)?: A Little Help needed walking in hospital room?: A Little Help needed climbing 3-5 steps with a railing? : A Lot 6 Click Score: 14    End of Session Equipment Utilized During Treatment: Gait belt Activity Tolerance: Patient tolerated treatment well Patient left: with call bell/phone within reach;in chair;with family/visitor present Nurse Communication: Mobility status PT Visit Diagnosis: Other abnormalities of gait and mobility (R26.89);Muscle weakness (generalized) (M62.81)     Time: 8412-8208 PT Time Calculation (min) (ACUTE ONLY): 25 min  Charges:  $Gait Training: 8-22 mins $Therapeutic Activity: 8-22 mins                    G Codes:       {Imunique Samad  PTA [facility]  Acute  Rehab Pager      [phone]

## 2017-06-11 NOTE — Progress Notes (Signed)
PROGRESS NOTE  Gary Yates. BJY:[id number] DOB: [birth year] DOA: 06/05/2017 PCP: [name], MD  HPI/Recap of past 24 hours: Gary, Yates. is a 84 year old male with medical history significant for paroxysmal A. fib, COPD, CAD, diabetes, non-Hodgkin's/testicular lymphoma, BPH, hyperlipidemia, hypertension, GERD presented to the ER complaining of worsening constipation, worsening dysuria and fever, ongoing for the past 3 days.  Patient tried taking Tylenol as well as his medication for overactive bladder with no effects.  In the ED patient noted to have a fever of 100.4, hypotensive, UA significant for infection.  Patient was also noted to have lower abdominal pain, subsequently bladder scan showed about 800 cc of urine.  Urology was consulted due to difficult Foley insertion.  Patient admitted for further management.  Today, patient reported feeling better, no nausea no vomiting.  Constipation is resolved.    Assessment/Plan: Principal Problem:   Sepsis ([place]) Active Problems:   Hyperlipidemia, mixed   Essential hypertension   Diabetic peripheral neuropathy (HCC)   Coronary artery disease   COPD (chronic obstructive pulmonary disease) (HCC)   Paroxysmal atrial fibrillation (HCC)   Poorly controlled type 2 diabetes mellitus with neuropathy (HCC)   Diffuse non-Hodgkin's lymphoma of testis (HCC)  Severe sepsis likely due to UTI Afebrile, no leukocytosis On presentation, patient with fever, tachycardia and hypotension BC x2 NGTD UA showed moderate leukocytes, few bacteria, WBC clumps, WBCs greater than 50 UC grew greater than 100,000 colonies of Staphylococcus specie Switched to IV Vancomycin, discussed with infectious disease, recommend to transition to Bactrim or doxycycline.  Will discuss with oncology tomorrow. Monitor closely  Acute urinary retention/BPH/hematuria Intermittent hematuria Bladder scan with over 800 cc of urine on presentation Likely due to  BPH Status post Foley catheter insertion by urology, currently draining henaturia Renal ultrasound showed no mass or hydronephrosis, just bilateral renal cystic changes without acute abnormalities Continue Flomax Urology on board, rec continue foley until follow-up appt in 1 week  Supratherapeutic INR Resolved, now subtherapeutic Patient on Coumadin, INR noted to be 6.91 on admission Due to hematuria reversed with IV vitamin K 2.5 mg Restart Coumadin Daily INR  Pancytopenia Oncology on board S/P filgastrim and 2 U of PRBC on 06/09/17 Daily CBC  Hypokalemia/Hypomagnesemia Replace prn  CKD stage II Creatinine at baseline Daily BMP  Constipation Continue MiraLAX, senna, Mg citrate, tap water enema  Anemia of chronic disease Baseline around 9-10, currently around baseline Monitor closely Daily CBC  Paroxysmal A. Fib Rate controlled Continue metoprolol, Coumadin  Diabetes mellitus type 2 Last A1c 6.4 SSI, Accu-Cheks  Non-Hodgkin's/testicular lymphoma In remission Status post chemotherapy  CAD/hyperlipidemia Continue statins  Overactive bladder Hold oxybutynin     Code Status: Full  Family Communication: Son at bedside  Disposition Plan: Home once stable   Consultants:  Urology  Oncology  Procedures:  None  Antimicrobials:  IV Vancomycin  DVT prophylaxis: SCDs, coumadin   Objective: Vitals:   06/10/17 2053 06/11/17 0414 06/11/17 1010 06/11/17 1323  BP: 131/70 110/65 (!) 108/56 113/66  Pulse: 78 72 80 93  Resp: 20 18  16   Temp: 98 F (36.7 C) 98.1 F (36.7 C) 97.7 F (36.5 C) 97.9 F (36.6 C)  TempSrc: Oral Oral Oral Oral  SpO2: 96% 93% 95% 93%  Weight:      Height:        Intake/Output Summary (Last 24 hours) at 06/11/2017 1815 Last data filed at 06/11/2017 1500 Gross per 24 hour  Intake 970 ml  Output 1750 ml  Net -780 ml   Filed Weights   06/05/17 2357 06/06/17 0635 06/10/17 0832  Weight: 102.5 kg (226 lb) 106 kg (233 lb  11 oz) 99.6 kg (219 lb 9.3 oz)    Exam:   General: NAD  Cardiovascular: S1, S2 present  Respiratory: Diminished breath sounds bilaterally  Abdomen: Soft, nontender, nondistended, bowel sounds present  Musculoskeletal: Trace bilateral pedal edema  Skin: Normal  Psychiatry: Normal mood   Data Reviewed: CBC: Recent Labs  Lab 06/06/17 0006  06/07/17 0446 06/08/17 0445 06/09/17 0402 06/10/17 0408 06/11/17 0354  WBC 4.0   < > 3.6* 2.2* 1.7* 6.7 4.7  NEUTROABS 2.8  --   --  1.5* 1.0* 5.6 3.7  HGB 8.5*   < > 8.2* 8.1* 8.1* 10.7* 10.4*  HCT 26.1*   < > 24.7* 25.1* 24.9* 32.1* 30.9*  MCV 96.0   < > 95.7 95.8 94.0 92.2 92.2  PLT 73*   < > 63* 55* 53* 53* 54*   < > = values in this interval not displayed.   Basic Metabolic Panel: Recent Labs  Lab 06/07/17 0446 06/08/17 0445 06/09/17 0402 06/10/17 0408 06/11/17 0354  NA 140 138 139 139 141  K 3.8 3.5 3.3* 3.0* 3.7  CL 113* 109 109 103 105  CO2 19* 21* 22 24 29   GLUCOSE 108* 97 110* 129* 116*  BUN 15 12 11 12 14   CREATININE 1.27* 1.07 0.94 1.03 0.91  CALCIUM 7.9* 7.8* 7.6* 8.1* 8.4*  MG  --   --  1.1* 1.5* 2.0   GFR: Estimated Creatinine Clearance: 74 mL/min (by C-G formula based on SCr of 0.91 mg/dL). Liver Function Tests: Recent Labs  Lab 06/06/17 0006  AST 18  ALT 9*  ALKPHOS 60  BILITOT 0.4  PROT 5.8*  ALBUMIN 2.6*   No results for input(s): LIPASE, AMYLASE in the last 168 hours. No results for input(s): AMMONIA in the last 168 hours. Coagulation Profile: Recent Labs  Lab 06/07/17 0446 06/08/17 0445 06/09/17 0402 06/10/17 0408 06/11/17 0354  INR 6.14* 1.54 1.38 1.47 1.86   Cardiac Enzymes: No results for input(s): CKTOTAL, CKMB, CKMBINDEX, TROPONINI in the last 168 hours. BNP (last 3 results) No results for input(s): PROBNP in the last 8760 hours. HbA1C: No results for input(s): HGBA1C in the last 72 hours. CBG: Recent Labs  Lab 06/10/17 1651 06/10/17 2115 06/11/17 0756 06/11/17 1209  06/11/17 1617  GLUCAP 156* 132* 123* 117* 164*   Lipid Profile: No results for input(s): CHOL, HDL, LDLCALC, TRIG, CHOLHDL, LDLDIRECT in the last 72 hours. Thyroid Function Tests: No results for input(s): TSH, T4TOTAL, FREET4, T3FREE, THYROIDAB in the last 72 hours. Anemia Panel: No results for input(s): VITAMINB12, FOLATE, FERRITIN, TIBC, IRON, RETICCTPCT in the last 72 hours. Urine analysis:    Component Value Date/Time   COLORURINE YELLOW 06/06/2017 0150   APPEARANCEUR TURBID (A) 06/06/2017 0150   LABSPEC 1.016 06/06/2017 0150   PHURINE 5.0 06/06/2017 0150   GLUCOSEU NEGATIVE 06/06/2017 0150   HGBUR SMALL (A) 06/06/2017 0150   HGBUR trace-intact 12/29/2006 0827   BILIRUBINUR NEGATIVE 06/06/2017 0150   KETONESUR NEGATIVE 06/06/2017 0150   PROTEINUR 100 (A) 06/06/2017 0150   UROBILINOGEN 0.2 12/29/2006 0827   NITRITE NEGATIVE 06/06/2017 0150   LEUKOCYTESUR MODERATE (A) 06/06/2017 0150   Sepsis Labs: @LABRCNTIP (procalcitonin:4,lacticidven:4)  ) Recent Results (from the past 240 hour(s))  Blood Culture (routine x 2)     Status: None   Collection Time: 06/06/17 12:06 AM  Result Value  Ref Range Status   Specimen Description   Final    BLOOD RIGHT ANTECUBITAL Performed at [facility] [address] [place], [place] [address]    Special Requests   Final    BOTTLES DRAWN AEROBIC AND ANAEROBIC Blood Culture adequate volume Performed at [facility] [address], [place], [place] [address]    Culture   Final    NO GROWTH 5 DAYS Performed at [facility], [place] [address] [place], [place] [address]    Report Status 06/11/2017 FINAL  Final  Blood Culture (routine x 2)     Status: None   Collection Time: 06/06/17 12:06 AM  Result Value Ref Range Status   Specimen Description   Final    BLOOD PORTA CATH Performed at [facility] [address], [place], [place] [address]    Special Requests   Final     BOTTLES DRAWN AEROBIC AND ANAEROBIC Blood Culture adequate volume Performed at [facility] [address], [place], [place] [address]    Culture   Final    NO GROWTH 5 DAYS Performed at [facility], [place] [address] [place], [place] [address]    Report Status 06/11/2017 FINAL  Final  Urine culture     Status: Abnormal   Collection Time: 06/06/17  1:50 AM  Result Value Ref Range Status   Specimen Description   Final    URINE, RANDOM Performed at [facility] [address] [place], [place] [address]    Special Requests   Final    NONE Performed at [facility], [place] [address] [place], [place] [address]    Culture (A)  Final    >=100,000 COLONIES/mL STAPHYLOCOCCUS SPECIES (COAGULASE NEGATIVE)   Report Status 06/08/2017 FINAL  Final   Organism ID, Bacteria STAPHYLOCOCCUS SPECIES (COAGULASE NEGATIVE) (A)  Final      Susceptibility   Staphylococcus species (coagulase negative) - MIC*    CIPROFLOXACIN >=8 RESISTANT Resistant     GENTAMICIN <=0.5 SENSITIVE Sensitive     NITROFURANTOIN <=16 SENSITIVE Sensitive     OXACILLIN >=4 RESISTANT Resistant     TETRACYCLINE <=1 SENSITIVE Sensitive     VANCOMYCIN <=0.5 SENSITIVE Sensitive     CLINDAMYCIN RESISTANT Resistant     RIFAMPIN <=0.5 SENSITIVE Sensitive     Inducible Clindamycin POSITIVE Resistant     * >=100,000 COLONIES/mL STAPHYLOCOCCUS SPECIES (COAGULASE NEGATIVE)      Studies: No results found.  Scheduled Meds: . atorvastatin  40 mg Oral Daily  . gabapentin  400 mg Oral TID  . insulin aspart  0-9 Units Subcutaneous TID WC  . insulin glargine  10 Units Subcutaneous Daily  . metoprolol succinate  100 mg Oral Daily  . omega-3 acid ethyl esters  1 g Oral Daily  . pantoprazole  40 mg Oral Daily  . polyethylene glycol  17 g Oral Daily  . pramipexole  3 mg Oral QHS  . senna-docusate  1 tablet Oral BID  . tamsulosin  0.4 mg Oral QHS  . vitamin B-12  1,000 mcg  Oral Daily  . Warfarin - Pharmacist Dosing Inpatient   Does not apply q1800    Continuous Infusions: . vancomycin Stopped (06/11/17 0950)     LOS: 5 days     Berle Mull, MD [facility]   If 7PM-7AM, please contact night-coverage [email or web address] Password [facility] 06/11/2017, 6:15 PM

## 2017-06-11 NOTE — Progress Notes (Signed)
[facility] for Warfarin Indication: atrial fibrillation  Allergies  Allergen Reactions  . Ace Inhibitors Other (See Comments)    cough  . Codeine Nausea Only and Rash       . Penicillins Rash    Childhood allergy Has patient had a PCN reaction causing immediate rash, facial/tongue/throat swelling, SOB or lightheadedness with hypotension: Yes Has patient had a PCN reaction causing severe rash involving mucus membranes or skin necrosis: Yes Has patient had a PCN reaction that required hospitalization No Has patient had a PCN reaction occurring within the last 10 years: No If all of the above answers are "NO", then may proceed with Cephalosporin use.     Patient Measurements: Height: 5\' 10"  (177.8 cm) Weight: 219 lb 9.3 oz (99.6 kg) IBW/kg (Calculated) : 73  Vital Signs: Temp: 97.7 F (36.5 C) (06/05 1010) Temp Source: Oral (06/05 1010) BP: 108/56 (06/05 1010) Pulse Rate: 80 (06/05 1010)  Labs: Recent Labs    06/09/17 0402 06/10/17 0408 06/11/17 0354  HGB 8.1* 10.7* 10.4*  HCT 24.9* 32.1* 30.9*  PLT 53* 53* 54*  LABPROT 16.9* 17.7* 21.3*  INR 1.38 1.47 1.86  CREATININE 0.94 1.03 0.91    Estimated Creatinine Clearance: 74 mL/min (by C-G formula based on SCr of 0.91 mg/dL).   Medical History: Past Medical History:  Diagnosis Date  . Anemia in chronic renal disease 05/07/2017  . Anxiety   . Atrial fibrillation ([place])   . COPD (chronic obstructive pulmonary disease) ([place])    pt. denies  . Coronary artery disease    a. h/o Overlapping stents RCA;  b. 06/2011 Cath: patent stents, nonobs dzs, NL EF.  . Diabetic peripheral neuropathy ([place])   . Diffuse non-Hodgkin's lymphoma of testis ([place]) 09/28/2015  . DM (diabetes mellitus) ([place])    Type 2, peripheral neuropathy.  . Dyspnea    with exertion  . Dysrhythmia   . GERD (gastroesophageal reflux disease)   . Headache   . History of bronchitis   . History of kidney stones   .  History of radiation therapy 02/19/16 - 03/13/16   Testis/Scrotum: 32.4 Gy in 18 fractions  . History of radiation therapy 08/07/16-08/20/16   left adrenal gland mass treated to 30 Gy in 10 fractions  . Hyperlipidemia   . Hypertension   . Low testosterone   . Nephrolithiasis   . Osteoarthritis    shoulder  . Restless leg   . SVT (supraventricular tachycardia) ([place])   . Urinary frequency   . Wears partial dentures    upper and lower    Medications:  PTA on warfarin 7.5mg  daily except 5mg  Wed  INR upon admission : 6.91  Patient was prescribed ciprofloxacin prior to admission (started taking on 5/22). Drug interaction between ciprofloxacin and warfarin could have contributed to elevated INR on admission  Vitamin K 2.5mg  IV x 1 given on 6/1 for hematuria   Assessment: 84 yr male admitted on 5/31 with sepsis likely due to UTI.  PMH significant for AFib for which he takes chronic Coumadin.  Known chronic low platelet count.  INR elevated on admission likely due to Cipro interaction.  Coumadin held 5/31, 6/1.  S/P Vit K 2.5mg  on 6/1.  06/11/2017:  INR 1.86- slowly rising to goal   Hgb 10.4 (s/p PRBC & Granix 6/3)  No bleeding noted  Eating ~25% CHO mod diet  DDI: on Vancomycin  Goal of Therapy:  INR 2-3   Plan:   Warfarin  5mg  PO x 1 dose today (home dose)  Daily PT/INR  Recommend resume previous home dose at discharge  Netta Cedars, PharmD, BCPS Pager: [phone] 06/11/17 @ 11:24 AM

## 2017-06-11 NOTE — Progress Notes (Signed)
Preliminary notes--Bilateral lower extremities venous duplex exam completed. Negative for DVT.  Incidental finding:  A 1.29x1.70x1.16cm complex fluid collection with hyperechoic components at the medial portion of popliteal fossa, questionable ruptured cyst.   Hongying Ladamien Rammel (RDMS RVT) 06/11/17 9:40 AM

## 2017-06-12 ENCOUNTER — Ambulatory Visit: Payer: Medicare Other

## 2017-06-12 ENCOUNTER — Other Ambulatory Visit: Payer: Medicare Other

## 2017-06-12 ENCOUNTER — Inpatient Hospital Stay (HOSPITAL_COMMUNITY): Payer: Medicare Other

## 2017-06-12 ENCOUNTER — Ambulatory Visit: Payer: Medicare Other | Admitting: Hematology & Oncology

## 2017-06-12 LAB — BASIC METABOLIC PANEL WITH GFR
Anion gap: 7 (ref 5–15)
BUN: 14 mg/dL (ref 6–20)
CO2: 29 mmol/L (ref 22–32)
Calcium: 8.2 mg/dL — ABNORMAL LOW (ref 8.9–10.3)
Chloride: 106 mmol/L (ref 101–111)
Creatinine, Ser: 0.92 mg/dL (ref 0.61–1.24)
GFR calc Af Amer: >60 mL/min
GFR calc non Af Amer: >60 mL/min
Glucose, Bld: 126 mg/dL — ABNORMAL HIGH (ref 65–99)
Potassium: 3.4 mmol/L — ABNORMAL LOW (ref 3.5–5.1)
Sodium: 142 mmol/L (ref 135–145)

## 2017-06-12 LAB — URINE CULTURE
Culture: NO GROWTH
SPECIAL REQUESTS: NORMAL

## 2017-06-12 LAB — CBC WITH DIFFERENTIAL/PLATELET
Basophils Absolute: 0 K/uL (ref 0.0–0.1)
Basophils Relative: 0 %
Eosinophils Absolute: 0.1 K/uL (ref 0.0–0.7)
Eosinophils Relative: 2 %
HCT: 30.7 % — ABNORMAL LOW (ref 39.0–52.0)
Hemoglobin: 9.9 g/dL — ABNORMAL LOW (ref 13.0–17.0)
Lymphocytes Relative: 21 %
Lymphs Abs: 0.5 K/uL — ABNORMAL LOW (ref 0.7–4.0)
MCH: 30.7 pg (ref 26.0–34.0)
MCHC: 32.2 g/dL (ref 30.0–36.0)
MCV: 95 fL (ref 78.0–100.0)
Monocytes Absolute: 0.3 K/uL (ref 0.1–1.0)
Monocytes Relative: 12 %
Neutro Abs: 1.7 K/uL (ref 1.7–7.7)
Neutrophils Relative %: 65 %
Platelets: 48 K/uL — ABNORMAL LOW (ref 150–400)
RBC: 3.23 MIL/uL — ABNORMAL LOW (ref 4.22–5.81)
RDW: 15.9 % — ABNORMAL HIGH (ref 11.5–15.5)
WBC: 2.6 K/uL — ABNORMAL LOW (ref 4.0–10.5)

## 2017-06-12 LAB — GLUCOSE, CAPILLARY
Glucose-Capillary: 122 mg/dL — ABNORMAL HIGH (ref 65–99)
Glucose-Capillary: 144 mg/dL — ABNORMAL HIGH (ref 65–99)

## 2017-06-12 LAB — PROTIME-INR
INR: 2.46
Prothrombin Time: 26.5 s — ABNORMAL HIGH (ref 11.4–15.2)

## 2017-06-12 LAB — MAGNESIUM: Magnesium: 1.9 mg/dL (ref 1.7–2.4)

## 2017-06-12 MED ORDER — DOXYCYCLINE HYCLATE 100 MG PO TABS: 100.0000 mg | ORAL_TABLET | Freq: Two times a day (BID) | ORAL | 0 refills | Status: DC

## 2017-06-12 MED ORDER — DOXYCYCLINE HYCLATE 100 MG PO TABS
100.0000 mg | ORAL_TABLET | Freq: Two times a day (BID) | ORAL | Status: DC
  Administered 2017-06-12: 100 mg via ORAL
  Filled 2017-06-12: qty 1

## 2017-06-12 MED ORDER — POTASSIUM CHLORIDE CRYS ER 20 MEQ PO TBCR
40.0000 meq | EXTENDED_RELEASE_TABLET | Freq: Once | ORAL | Status: AC
  Administered 2017-06-12: 40 meq via ORAL
  Filled 2017-06-12: qty 2

## 2017-06-12 MED ORDER — POLYETHYLENE GLYCOL 3350 17 GM/SCOOP PO POWD: ORAL | 3 refills | Status: DC

## 2017-06-12 MED ORDER — WARFARIN SODIUM 5 MG PO TABS: 5.0000 mg | ORAL_TABLET | Freq: Once | ORAL | Status: DC

## 2017-06-12 MED ORDER — HEPARIN SOD (PORK) LOCK FLUSH 100 UNIT/ML IV SOLN: 500.0000 [IU] | INTRAVENOUS | Status: DC | PRN

## 2017-06-12 NOTE — Progress Notes (Signed)
[facility] for Warfarin Indication: atrial fibrillation  Allergies  Allergen Reactions  . Ace Inhibitors Other (See Comments)    cough  . Codeine Nausea Only and Rash       . Penicillins Rash    Childhood allergy Has patient had a PCN reaction causing immediate rash, facial/tongue/throat swelling, SOB or lightheadedness with hypotension: Yes Has patient had a PCN reaction causing severe rash involving mucus membranes or skin necrosis: Yes Has patient had a PCN reaction that required hospitalization No Has patient had a PCN reaction occurring within the last 10 years: No If all of the above answers are "NO", then may proceed with Cephalosporin use.     Patient Measurements: Height: 5\' 10"  (177.8 cm) Weight: 219 lb 9.3 oz (99.6 kg) IBW/kg (Calculated) : 73  Vital Signs: Temp: 98.2 F (36.8 C) (06/06 0657) Temp Source: Oral (06/06 0657) BP: 127/70 (06/06 0657) Pulse Rate: 91 (06/06 0657)  Labs: Recent Labs    06/10/17 0408 06/11/17 0354 06/12/17 0433  HGB 10.7* 10.4* 9.9*  HCT 32.1* 30.9* 30.7*  PLT 53* 54* 48*  LABPROT 17.7* 21.3* 26.5*  INR 1.47 1.86 2.46  CREATININE 1.03 0.91 0.92    Estimated Creatinine Clearance: 73.2 mL/min (by C-G formula based on SCr of 0.92 mg/dL).   Medical History: Past Medical History:  Diagnosis Date  . Anemia in chronic renal disease 05/07/2017  . Anxiety   . Atrial fibrillation ([place])   . COPD (chronic obstructive pulmonary disease) ([place])    pt. denies  . Coronary artery disease    a. h/o Overlapping stents RCA;  b. 06/2011 Cath: patent stents, nonobs dzs, NL EF.  . Diabetic peripheral neuropathy ([place])   . Diffuse non-Hodgkin's lymphoma of testis ([place]) 09/28/2015  . DM (diabetes mellitus) ([place])    Type 2, peripheral neuropathy.  . Dyspnea    with exertion  . Dysrhythmia   . GERD (gastroesophageal reflux disease)   . Headache   . History of bronchitis   . History of kidney stones   .  History of radiation therapy 02/19/16 - 03/13/16   Testis/Scrotum: 32.4 Gy in 18 fractions  . History of radiation therapy 08/07/16-08/20/16   left adrenal gland mass treated to 30 Gy in 10 fractions  . Hyperlipidemia   . Hypertension   . Low testosterone   . Nephrolithiasis   . Osteoarthritis    shoulder  . Restless leg   . SVT (supraventricular tachycardia) ([place])   . Urinary frequency   . Wears partial dentures    upper and lower    Medications:  PTA on warfarin 7.5mg  daily except 5mg  Wed  INR upon admission : 6.91  Patient was prescribed ciprofloxacin prior to admission (started taking on 5/22). Drug interaction between ciprofloxacin and warfarin could have contributed to elevated INR on admission  Vitamin K 2.5mg  IV x 1 given on 6/1 for hematuria   Assessment: 84 yr male admitted on 5/31 with sepsis likely due to UTI.  PMH significant for AFib for which he takes chronic Coumadin.  Known chronic low platelet count.  INR elevated on admission likely due to Cipro interaction.  Coumadin held 5/31, 6/1.  S/P Vit K 2.5mg  on 6/1.  06/12/2017:  INR 2.48- at goal today  Hgb 9.9 (s/p PRBC & Granix 6/3)  No bleeding noted  Eating 100% CHO mod diet  DDI: on Doxycycline  Goal of Therapy:  INR 2-3   Plan:   Continue Warfarin 5mg   PO x 1 dose today (slightly lower than home dose to ensure INR stays <3)  Daily PT/INR  Netta Cedars, PharmD, BCPS Pager: [phone] 06/12/17 @ 10:49 AM

## 2017-06-12 NOTE — Progress Notes (Signed)
Overall, everything is pretty stable for Gary Yates.  I did send off a urine culture on him yesterday.  He is still on vancomycin.  Apparently, he had a low-grade temperature yesterday.   I am not sure when he is going to be discharged.  No labs are back yet this morning.  He had an echocardiogram.  This looked okay.  He still has a Foley catheter in.  It sounds like urology is the one who will remove this but as an outpatient.  He says he has some leg pain bilaterally.  We did do a Doppler of his legs yesterday.  These were normal without any thromboembolic events.  It is hard to say how much he is eating.  There is no nausea or vomiting.  His vital signs all look stable.  He is afebrile this morning.  His temperature is 98.2.  His blood pressure is 127/70.  There is really no change in his physical examination.  Again, hopefully he will be discharged in a day or so.  Lattie Haw, MD  Job 19:25

## 2017-06-12 NOTE — Progress Notes (Signed)
Pt discharged home with wife and daughter in stable condition. Discharge instructions given. Leg bag teaching done. Pt and family verbalized understanding. No immediate questions or concerns at this time. Pt discharged from unit via wheelchair.

## 2017-06-13 ENCOUNTER — Ambulatory Visit: Payer: Medicare Other | Admitting: Cardiovascular Disease

## 2017-06-13 NOTE — Care Management Note (Signed)
Case Management Note  Patient Details  Name: Gary Yates. MRN: [id number] Date of Birth: 04/03/1933  Subjective/Objective:TC [facility] rep Michelle-informed of d/c yesterday home w/HHC-ordered.                    Action/Plan:home w/HHC.   Expected Discharge Date:  06/12/17               Expected Discharge Plan:  [place]  In-House Referral:     Discharge planning Services  CM Consult  Post Acute Care Choice:  Home Health(Active w/[facility].) Choice offered to:     DME Arranged:    DME Agency:     HH Arranged:  RN, PT, OT HH Agency:  [facility]  Status of Service:  Completed, signed off  If discussed at [place] of Stay Meetings, dates discussed:    Additional Comments:  Dessa Phi, RN 06/13/2017, 9:46 AM

## 2017-06-15 NOTE — Discharge Summary (Signed)
[facility] Discharge Summary   Patient: Gary Yates. AJO:[id number]   PCP: [name], MD DOB: [birth year]   Date of admission: 06/05/2017   Date of discharge: 06/12/2017    Discharge Diagnoses:  Principal Problem:   Sepsis ([place]) Active Problems:   Hyperlipidemia, mixed   Essential hypertension   Diabetic peripheral neuropathy ([place])   Coronary artery disease   COPD (chronic obstructive pulmonary disease) (HCC)   Paroxysmal atrial fibrillation ([place])   Poorly controlled type 2 diabetes mellitus with neuropathy ([place])   Diffuse non-Hodgkin's lymphoma of testis ([place])   Admitted From: home Disposition:  Home with home health  Recommendations for Outpatient Follow-up:  1. Please follow up with PCP in 1 week   Follow-up Information    [name], MD. Schedule an appointment as soon as possible for a visit in 1 week(s).   Specialty:  Family Medicine Contact information: [place] [address] [phone]        Cleon Gustin, MD. Schedule an appointment as soon as possible for a visit in 1 week(s).   Specialty:  Urology Contact information: [place] [address] [phone]          Diet recommendation: cardiac carb modified diet  Activity: The patient is advised to gradually reintroduce usual activities.  Discharge Condition: good  Code Status: full code  History of present illness: As per the H and P dictated on admission, "Gary Yates. is a 84 y.o. male with medical history significant of paroxysmal atrial fibrillation, COPD, CAD, diabetes, non-Hodgkin's lymphoma status post chemo and radiation therapy, hyperlipidemia, hypertension, GERD.  Patient presented to the emergency department secondary to worsening constipation.  He has an issue with intermittent constipation for the last few months.  He takes laxatives which have helped him have a bowel movement.  He also reports some worsening dysuria.   Last night he was not feeling well and his  temperature was checked rectally at home temperature of 100 and was significant for an 101 F.  He has been taking Tylenol which is not helped with his symptoms.  He has associated hesitancy and urgency.  He has a history of overactive bladder for which he takes medication.  He reports no history of enlarged prostate but does take tamsulosin."  Hospital Course:  Summary of his active problems in the hospital is as following. Severe sepsis likely due to UTI Afebrile, no leukocytosis On presentation, patient with fever, tachycardia and hypotension BC x2 NGTD UA showed moderate leukocytes, few bacteria, WBC clumps, WBCs greater than 50 UC grew greater than 100,000 colonies of Staphylococcus specie Switched to IV Vancomycin, discussed with infectious disease, recommend to transition to Bactrim or doxycycline.   Switch to doxycycline on discharge for 5 more days Follow up with urology as an outpatient   Acute urinary retention/BPH/hematuria Intermittent hematuria Bladder scan with over 800 cc of urine on presentation Likely due to BPH Status post Foley catheter insertion by urology, currently draining henaturia Renal ultrasound showed no mass or hydronephrosis, just bilateral renal cystic changes without acute abnormalities Continue Flomax Urology on board, rec continue foley until follow-up appt in 1 week  Supratherapeutic INR Resolved, now subtherapeutic Patient on Coumadin, INR noted to be 6.91 on admission Due to hematuria reversed with IV vitamin K 2.5 mg Restart Coumadin Daily INR  Pancytopenia Oncology on board S/P filgastrim and 2 U of PRBC on 06/09/17 Outpatient   Hypokalemia/Hypomagnesemia Replace prn  CKD stage II Creatinine at baseline Daily BMP  Constipation Continue MiraLAX, senna, Mg citrate, tap water enema  Anemia of chronic disease Baseline around 9-10, currently around baseline Monitor closely Daily  CBC  Paroxysmal A. Fib NSVT Rate controlled Continue metoprolol, Coumadin Asymptomatic NSVT, potassium replaced, no PVC after that, continue beta blocker.    Diabetes mellitus type 2 Last A1c 6.4 SSI, Accu-Cheks  Non-Hodgkin's/testicular lymphoma In remission Status post chemotherapy  CAD/hyperlipidemia Continue statins  Overactive bladder Hold oxybutynin  All other chronic medical condition were stable during the hospitalization.  Patient was seen by physical therapy, who recommended home health, which was arranged by Education officer, museum and case Freight forwarder. On the day of the discharge the patient's vital , and no other acute medical condition were reported by patient. the patient was felt safe to be discharge at home with home health.  Consultants: oncology  Procedures: PRBC transfusion   DISCHARGE MEDICATION: Allergies as of 06/12/2017      Reactions   Ace Inhibitors Other (See Comments)   cough   Codeine Nausea Only, Rash      Penicillins Rash   Childhood allergy Has patient had a PCN reaction causing immediate rash, facial/tongue/throat swelling, SOB or lightheadedness with hypotension: Yes Has patient had a PCN reaction causing severe rash involving mucus membranes or skin necrosis: Yes Has patient had a PCN reaction that required hospitalization No Has patient had a PCN reaction occurring within the last 10 years: No If all of the above answers are "NO", then may proceed with Cephalosporin use.      Medication List    STOP taking these medications   ciprofloxacin 500 MG tablet Commonly known as:  CIPRO   oxybutynin 15 MG 24 hr tablet Commonly known as:  DITROPAN XL     TAKE these medications   ACCU-CHEK AVIVA PLUS test strip Generic drug:  glucose blood TEST 3 TIMES A DAY What changed:    when to take this  additional instructions   acetaminophen 500 MG tablet Commonly known as:  TYLENOL Take 1,000 mg by mouth every 6 (six) hours as needed (for  pain/fever/headaches.).   atorvastatin 40 MG tablet Commonly known as:  LIPITOR TAKE 1 TABLET BY MOUTH EVERY DAY   clopidogrel 75 MG tablet Commonly known as:  PLAVIX TAKE 1 TABLET BY MOUTH DAILY   dexamethasone 4 MG tablet Commonly known as:  DECADRON Take 2 tablets one time a day for 2 days starting the day after chemotherapy. Take with food.   docusate sodium 50 MG capsule Commonly known as:  COLACE Take 1 capsule (50 mg total) by mouth 2 (two) times daily as needed (for constipation).   doxycycline 100 MG tablet Commonly known as:  VIBRA-TABS Take 1 tablet (100 mg total) by mouth 2 (two) times daily for 5 days.   furosemide 40 MG tablet Commonly known as:  LASIX Take 1 tablet (40 mg total) by mouth 2 (two) times daily.   gabapentin 300 MG capsule Commonly known as:  NEURONTIN TAKE 2 CAPSULES BY MOUTH 3 TIMES A DAY   KLOR-CON M20 20 MEQ tablet Generic drug:  potassium chloride SA TAKE 1 TABLET BY MOUTH EVERY DAY   lactulose 10 GM/15ML solution Commonly known as:  CHRONULAC Take 15 mLs (10 g total) by mouth every 6 (six) hours as needed for mild constipation or moderate constipation.   magic mouthwash w/lidocaine Soln Take 5 mLs by mouth 4 (four) times daily as needed for mouth pain. 1:1 ratio.  DIPHENHYDRAMINE HCL (ANTIHISTAMINES - ETHANOLAMINES),ALUM & MAG HYDROXIDE-SIMETH,NYSTATIN (ANTI-INFECTIVES - THROAT),LIDOCAINE HCL (ANESTHETICS TOPICAL ORAL)   Melatonin 1 MG Tabs Take 1 tablet by mouth at bedtime as needed (sleep).   metFORMIN 500 MG tablet Commonly known as:  GLUCOPHAGE TAKE 1 TABLET BY MOUTH TWICE A DAY WITH A MEAL   metoprolol succinate 100 MG 24 hr tablet Commonly known as:  TOPROL-XL TAKE 1 TABLET BY MOUTH EVERY DAY IMMEDIATELY FOLLOWING A MEAL   nitroGLYCERIN 0.4 MG SL tablet Commonly known as:  NITROSTAT Place 1 tablet (0.4 mg total) under the tongue every 5 (five) minutes as needed. Chest pain   omega-3 acid ethyl esters 1 g capsule Commonly  known as:  LOVAZA Take 1 g by mouth daily.   ondansetron 8 MG tablet Commonly known as:  ZOFRAN Take 1 tablet (8 mg total) by mouth 2 (two) times daily as needed. Start on day 6 of chemotherapy.   pantoprazole 40 MG tablet Commonly known as:  PROTONIX TAKE 1 TABLET BY MOUTH EVERY DAY   polyethylene glycol powder powder Commonly known as:  MIRALAX Take 17g daily, may increase to 2 times daily if constipated.   pramipexole 1.5 MG tablet Commonly known as:  MIRAPEX Take two tablets every night.   prochlorperazine 10 MG tablet Commonly known as:  COMPAZINE Take 1 tablet (10 mg total) by mouth every 6 (six) hours as needed (Nausea or vomiting).   tamsulosin 0.4 MG Caps capsule Commonly known as:  FLOMAX Take 0.4 mg by mouth at bedtime.   temazepam 30 MG capsule Commonly known as:  RESTORIL TAKE ONE CAPSULE AT BEDTIME AS NEEDED SLEEP   TOUJEO SOLOSTAR 300 UNIT/ML Sopn Generic drug:  Insulin Glargine INJECT 20 UNITS INTO THE SKIN DAILY AFTER BREAKFAST.   Trospium Chloride 60 MG Cp24 Take 60 mg by mouth daily.   vitamin B-12 1000 MCG tablet Commonly known as:  CYANOCOBALAMIN Take 1,000 mcg by mouth daily.   warfarin 5 MG tablet Commonly known as:  COUMADIN Take as directed. If you are unsure how to take this medication, talk to your nurse or doctor. Original instructions:  Take as directed by anticoagulation clinic What changed:    how to take this  when to take this  additional instructions      Allergies  Allergen Reactions  . Ace Inhibitors Other (See Comments)    cough  . Codeine Nausea Only and Rash       . Penicillins Rash    Childhood allergy Has patient had a PCN reaction causing immediate rash, facial/tongue/throat swelling, SOB or lightheadedness with hypotension: Yes Has patient had a PCN reaction causing severe rash involving mucus membranes or skin necrosis: Yes Has patient had a PCN reaction that required hospitalization No Has patient had a  PCN reaction occurring within the last 10 years: No If all of the above answers are "NO", then may proceed with Cephalosporin use.    Discharge Instructions    Diet - low sodium heart healthy   Complete by:  As directed    Increase activity slowly   Complete by:  As directed    Urinary leg bag   Complete by:  As directed      Discharge Exam: Filed Weights   06/05/17 2357 06/06/17 0635 06/10/17 0832  Weight: 102.5 kg (226 lb) 106 kg (233 lb 11 oz) 99.6 kg (219 lb 9.3 oz)   Vitals:   06/12/17 0657 06/12/17 1244  BP: 127/70 118/63  Pulse: 91 78  Resp: 18 17  Temp: 98.2 F (36.8 C) 98.7 F (37.1 C)  SpO2: 96% 95%   General: Appear in mild distress, no Rash; Oral Mucosa moist. Cardiovascular: S1 and S2 Present, aortic systolic  Murmur, no JVD Respiratory: Bilateral Air entry present and Clear to Auscultation, no Crackles, no wheezes Abdomen: Bowel Sound present, Soft and no tenderness Extremities: no Pedal edema, no calf tenderness Neurology: Grossly no focal neuro deficit.  The results of significant diagnostics from this hospitalization (including imaging, microbiology, ancillary and laboratory) are listed below for reference.    Significant Diagnostic Studies: Dg Chest 2 View  Result Date: 06/05/2017 CLINICAL DATA:  Weakness, hypotension EXAM: CHEST - 2 VIEW COMPARISON:  PET-CT dated 04/28/2017 FINDINGS: Mild left basilar scarring/atelectasis. No focal consolidation. No pleural effusion or pneumothorax. The heart is normal in size. Right chest port terminates in the lower SVC. Degenerative changes of the visualized thoracolumbar spine. IMPRESSION: No evidence of acute cardiopulmonary disease. Electronically Signed   By: Julian Hy M.D.   On: 06/05/2017 23:40   US Renal  Result Date: 06/06/2017 CLINICAL DATA:  Acute renal injury EXAM: RENAL / URINARY TRACT ULTRASOUND COMPLETE COMPARISON:  04/28/2017 PET-CT FINDINGS: Right Kidney: Length: 12.2 cm. No mass or  hydronephrosis is noted. A 5.6 cm cyst is noted in the midportion of the right kidney similar to that seen on prior PET-CT. Smaller cyst is noted as well also stable from the recent PET-CT. Left Kidney: Length: 12.4 cm. No mass or hydronephrosis is noted. Renal cystic changes noted similar to that seen on prior CT T. The largest of these measures 5.1 cm in greatest dimension. Bladder: Decompressed by Foley catheter. IMPRESSION: Bilateral renal cystic change without acute abnormality. Electronically Signed   By: Inez Catalina M.D.   On: 06/06/2017 15:56   Dg Abd Portable 1v  Result Date: 06/12/2017 CLINICAL DATA:  Constipation. EXAM: PORTABLE ABDOMEN - 1 VIEW COMPARISON:  04/28/2017 PET CT and prior studies FINDINGS: A moderate amount of stool within the RIGHT colon noted. Moderate gas in the transverse colon present. No distended small bowel loops are identified. A catheter overlying the pelvis is present. No acute bony abnormalities are identified. No suspicious calcifications are present. IMPRESSION: Moderate RIGHT colonic stool and moderate transverse colonic distension which may represent a mild ileus. No evidence of small bowel obstruction. Electronically Signed   By: Margarette Canada M.D.   On: 06/12/2017 14:28    Microbiology: Recent Results (from the past 240 hour(s))  Blood Culture (routine x 2)     Status: None   Collection Time: 06/06/17 12:06 AM  Result Value Ref Range Status   Specimen Description   Final    BLOOD RIGHT ANTECUBITAL Performed at [facility] [address] [place], [place] [address]    Special Requests   Final    BOTTLES DRAWN AEROBIC AND ANAEROBIC Blood Culture adequate volume Performed at [facility] [address], [place], [place] [address]    Culture   Final    NO GROWTH 5 DAYS Performed at [facility], [place] [address] [place], [place] [address]    Report Status 06/11/2017 FINAL  Final  Blood Culture (routine x 2)      Status: None   Collection Time: 06/06/17 12:06 AM  Result Value Ref Range Status   Specimen Description   Final    BLOOD PORTA CATH Performed at [facility] [address] [place], [place] [address]  Special Requests   Final    BOTTLES DRAWN AEROBIC AND ANAEROBIC Blood Culture adequate volume Performed at [facility] [address] [place], [place] [address]    Culture   Final    NO GROWTH 5 DAYS Performed at [facility], [place] [address], [place], [place] [address]    Report Status 06/11/2017 FINAL  Final  Urine culture     Status: Abnormal   Collection Time: 06/06/17  1:50 AM  Result Value Ref Range Status   Specimen Description   Final    URINE, RANDOM Performed at [facility] [address], [place], [place] [address]    Special Requests   Final    NONE Performed at [facility], [place] [address] [place], [place] [address]    Culture (A)  Final    >=100,000 COLONIES/mL STAPHYLOCOCCUS SPECIES (COAGULASE NEGATIVE)   Report Status 06/08/2017 FINAL  Final   Organism ID, Bacteria STAPHYLOCOCCUS SPECIES (COAGULASE NEGATIVE) (A)  Final      Susceptibility   Staphylococcus species (coagulase negative) - MIC*    CIPROFLOXACIN >=8 RESISTANT Resistant     GENTAMICIN <=0.5 SENSITIVE Sensitive     NITROFURANTOIN <=16 SENSITIVE Sensitive     OXACILLIN >=4 RESISTANT Resistant     TETRACYCLINE <=1 SENSITIVE Sensitive     VANCOMYCIN <=0.5 SENSITIVE Sensitive     CLINDAMYCIN RESISTANT Resistant     RIFAMPIN <=0.5 SENSITIVE Sensitive     Inducible Clindamycin POSITIVE Resistant     * >=100,000 COLONIES/mL STAPHYLOCOCCUS SPECIES (COAGULASE NEGATIVE)  Urine Culture     Status: None   Collection Time: 06/11/17  1:54 PM  Result Value Ref Range Status   Specimen Description   Final    URINE, CATHETERIZED Performed at [facility] [address] [place], [place] [address]     Special Requests   Final    Normal Performed at [facility], [place] [address] [place], [place] [address]    Culture   Final    NO GROWTH Performed at [facility], [place] [address], [place],  [address]    Report Status 06/12/2017 FINAL  Final     Labs: CBC: Recent Labs  Lab 06/09/17 0402 06/10/17 0408 06/11/17 0354 06/12/17 0433  WBC 1.7* 6.7 4.7 2.6*  NEUTROABS 1.0* 5.6 3.7 1.7  HGB 8.1* 10.7* 10.4* 9.9*  HCT 24.9* 32.1* 30.9* 30.7*  MCV 94.0 92.2 92.2 95.0  PLT 53* 53* 54* 48*   Basic Metabolic Panel: Recent Labs  Lab 06/09/17 0402 06/10/17 0408 06/11/17 0354 06/12/17 0433  NA 139 139 141 142  K 3.3* 3.0* 3.7 3.4*  CL 109 103 105 106  CO2 22 24 29 29   GLUCOSE 110* 129* 116* 126*  BUN 11 12 14 14   CREATININE 0.94 1.03 0.91 0.92  CALCIUM 7.6* 8.1* 8.4* 8.2*  MG 1.1* 1.5* 2.0 1.9   CBG: Recent Labs  Lab 06/11/17 1209 06/11/17 1617 06/11/17 2056 06/12/17 0818 06/12/17 1141  GLUCAP 117* 164* 147* 122* 144*   Time spent: 35 minutes  Signed:  Berle Mull  [facility] 06/12/2017  , 3:16 PM

## 2017-06-16 ENCOUNTER — Ambulatory Visit: Payer: Medicare Other

## 2017-06-16 ENCOUNTER — Inpatient Hospital Stay (HOSPITAL_COMMUNITY)
Admission: EM | Admit: 2017-06-16 | Discharge: 2017-06-20 | DRG: 726 | Disposition: A | Discharge status: Home Health | Payer: Medicare Other | Attending: Family Medicine | Admitting: Family Medicine

## 2017-06-16 ENCOUNTER — Other Ambulatory Visit: Payer: Self-pay | Admitting: *Deleted

## 2017-06-16 ENCOUNTER — Encounter (HOSPITAL_COMMUNITY): Payer: Self-pay | Admitting: Emergency Medicine

## 2017-06-16 ENCOUNTER — Other Ambulatory Visit: Payer: Self-pay

## 2017-06-16 ENCOUNTER — Telehealth: Payer: Self-pay | Admitting: *Deleted

## 2017-06-16 DIAGNOSIS — Z7984 Long term (current) use of oral hypoglycemic drugs: Secondary | ICD-10-CM

## 2017-06-16 DIAGNOSIS — N136 Pyonephrosis: Secondary | ICD-10-CM | POA: Diagnosis present

## 2017-06-16 DIAGNOSIS — Z87442 Personal history of urinary calculi: Secondary | ICD-10-CM

## 2017-06-16 DIAGNOSIS — J449 Chronic obstructive pulmonary disease, unspecified: Secondary | ICD-10-CM | POA: Diagnosis present

## 2017-06-16 DIAGNOSIS — F419 Anxiety disorder, unspecified: Secondary | ICD-10-CM | POA: Diagnosis present

## 2017-06-16 DIAGNOSIS — Z880 Allergy status to penicillin: Secondary | ICD-10-CM

## 2017-06-16 DIAGNOSIS — R339 Retention of urine, unspecified: Secondary | ICD-10-CM | POA: Diagnosis present

## 2017-06-16 DIAGNOSIS — Z9861 Coronary angioplasty status: Secondary | ICD-10-CM

## 2017-06-16 DIAGNOSIS — Z79899 Other long term (current) drug therapy: Secondary | ICD-10-CM

## 2017-06-16 DIAGNOSIS — N401 Enlarged prostate with lower urinary tract symptoms: Principal | ICD-10-CM | POA: Diagnosis present

## 2017-06-16 DIAGNOSIS — R791 Abnormal coagulation profile: Secondary | ICD-10-CM | POA: Diagnosis present

## 2017-06-16 DIAGNOSIS — K219 Gastro-esophageal reflux disease without esophagitis: Secondary | ICD-10-CM | POA: Diagnosis present

## 2017-06-16 DIAGNOSIS — N179 Acute kidney failure, unspecified: Secondary | ICD-10-CM | POA: Diagnosis present

## 2017-06-16 DIAGNOSIS — Z7901 Long term (current) use of anticoagulants: Secondary | ICD-10-CM

## 2017-06-16 DIAGNOSIS — I129 Hypertensive chronic kidney disease with stage 1 through stage 4 chronic kidney disease, or unspecified chronic kidney disease: Secondary | ICD-10-CM | POA: Diagnosis present

## 2017-06-16 DIAGNOSIS — Z8249 Family history of ischemic heart disease and other diseases of the circulatory system: Secondary | ICD-10-CM

## 2017-06-16 DIAGNOSIS — E871 Hypo-osmolality and hyponatremia: Secondary | ICD-10-CM | POA: Diagnosis present

## 2017-06-16 DIAGNOSIS — D696 Thrombocytopenia, unspecified: Secondary | ICD-10-CM | POA: Diagnosis present

## 2017-06-16 DIAGNOSIS — R338 Other retention of urine: Secondary | ICD-10-CM | POA: Diagnosis present

## 2017-06-16 DIAGNOSIS — N183 Chronic kidney disease, stage 3 (moderate): Secondary | ICD-10-CM | POA: Diagnosis present

## 2017-06-16 DIAGNOSIS — I2510 Atherosclerotic heart disease of native coronary artery without angina pectoris: Secondary | ICD-10-CM | POA: Diagnosis present

## 2017-06-16 DIAGNOSIS — R197 Diarrhea, unspecified: Secondary | ICD-10-CM | POA: Diagnosis present

## 2017-06-16 DIAGNOSIS — Z923 Personal history of irradiation: Secondary | ICD-10-CM

## 2017-06-16 DIAGNOSIS — C8595 Non-Hodgkin lymphoma, unspecified, lymph nodes of inguinal region and lower limb: Secondary | ICD-10-CM | POA: Diagnosis present

## 2017-06-16 DIAGNOSIS — C8589 Other specified types of non-Hodgkin lymphoma, extranodal and solid organ sites: Secondary | ICD-10-CM | POA: Diagnosis present

## 2017-06-16 DIAGNOSIS — Z85828 Personal history of other malignant neoplasm of skin: Secondary | ICD-10-CM

## 2017-06-16 DIAGNOSIS — E785 Hyperlipidemia, unspecified: Secondary | ICD-10-CM | POA: Diagnosis present

## 2017-06-16 DIAGNOSIS — E1165 Type 2 diabetes mellitus with hyperglycemia: Secondary | ICD-10-CM | POA: Diagnosis present

## 2017-06-16 DIAGNOSIS — T451X5A Adverse effect of antineoplastic and immunosuppressive drugs, initial encounter: Secondary | ICD-10-CM

## 2017-06-16 DIAGNOSIS — I480 Paroxysmal atrial fibrillation: Secondary | ICD-10-CM | POA: Diagnosis present

## 2017-06-16 DIAGNOSIS — Z888 Allergy status to other drugs, medicaments and biological substances status: Secondary | ICD-10-CM

## 2017-06-16 DIAGNOSIS — Z7902 Long term (current) use of antithrombotics/antiplatelets: Secondary | ICD-10-CM

## 2017-06-16 DIAGNOSIS — D6959 Other secondary thrombocytopenia: Secondary | ICD-10-CM | POA: Diagnosis present

## 2017-06-16 DIAGNOSIS — N390 Urinary tract infection, site not specified: Secondary | ICD-10-CM

## 2017-06-16 DIAGNOSIS — A419 Sepsis, unspecified organism: Secondary | ICD-10-CM | POA: Diagnosis present

## 2017-06-16 DIAGNOSIS — D701 Agranulocytosis secondary to cancer chemotherapy: Secondary | ICD-10-CM

## 2017-06-16 DIAGNOSIS — D631 Anemia in chronic kidney disease: Secondary | ICD-10-CM | POA: Diagnosis present

## 2017-06-16 DIAGNOSIS — E1122 Type 2 diabetes mellitus with diabetic chronic kidney disease: Secondary | ICD-10-CM | POA: Diagnosis present

## 2017-06-16 DIAGNOSIS — E876 Hypokalemia: Secondary | ICD-10-CM | POA: Diagnosis present

## 2017-06-16 DIAGNOSIS — Z885 Allergy status to narcotic agent status: Secondary | ICD-10-CM

## 2017-06-16 DIAGNOSIS — E1140 Type 2 diabetes mellitus with diabetic neuropathy, unspecified: Secondary | ICD-10-CM | POA: Diagnosis present

## 2017-06-16 LAB — COMPREHENSIVE METABOLIC PANEL
ALT: 11 U/L — ABNORMAL LOW (ref 17–63)
AST: 25 U/L (ref 15–41)
Albumin: 3.2 g/dL — ABNORMAL LOW (ref 3.5–5.0)
Alkaline Phosphatase: 94 U/L (ref 38–126)
Anion gap: 10 (ref 5–15)
BUN: 19 mg/dL (ref 6–20)
CO2: 23 mmol/L (ref 22–32)
Calcium: 8.8 mg/dL — ABNORMAL LOW (ref 8.9–10.3)
Chloride: 98 mmol/L — ABNORMAL LOW (ref 101–111)
Creatinine, Ser: 1.38 mg/dL — ABNORMAL HIGH (ref 0.61–1.24)
GFR calc Af Amer: 53 mL/min — ABNORMAL LOW (ref >60)
GFR calc non Af Amer: 46 mL/min — ABNORMAL LOW (ref >60)
Glucose, Bld: 132 mg/dL — ABNORMAL HIGH (ref 65–99)
Potassium: 3.7 mmol/L (ref 3.5–5.1)
Sodium: 131 mmol/L — ABNORMAL LOW (ref 135–145)
Total Bilirubin: 0.5 mg/dL (ref 0.3–1.2)
Total Protein: 7 g/dL (ref 6.5–8.1)

## 2017-06-16 LAB — URINALYSIS, ROUTINE W REFLEX MICROSCOPIC
Bacteria, UA: NONE SEEN
Bilirubin Urine: NEGATIVE
GLUCOSE, UA: 50 mg/dL — AB
KETONES UR: NEGATIVE mg/dL
Nitrite: NEGATIVE
PH: 6 (ref 5.0–8.0)
PROTEIN: 100 mg/dL — AB
RBC / HPF: >50 RBC/hpf — ABNORMAL HIGH (ref 0–5)
Specific Gravity, Urine: 1.009 (ref 1.005–1.030)

## 2017-06-16 LAB — CBC WITH DIFFERENTIAL/PLATELET
Basophils Absolute: 0 10*3/uL (ref 0.0–0.1)
Basophils Relative: 0 %
Eosinophils Absolute: 0 10*3/uL (ref 0.0–0.7)
Eosinophils Relative: 1 %
HCT: 37.6 % — ABNORMAL LOW (ref 39.0–52.0)
Hemoglobin: 12.6 g/dL — ABNORMAL LOW (ref 13.0–17.0)
Lymphocytes Relative: 16 %
Lymphs Abs: 0.7 10*3/uL (ref 0.7–4.0)
MCH: 31.1 pg (ref 26.0–34.0)
MCHC: 33.5 g/dL (ref 30.0–36.0)
MCV: 92.8 fL (ref 78.0–100.0)
Monocytes Absolute: 0.4 10*3/uL (ref 0.1–1.0)
Monocytes Relative: 9 %
Neutro Abs: 3.3 10*3/uL (ref 1.7–7.7)
Neutrophils Relative %: 74 %
Platelets: 92 10*3/uL — ABNORMAL LOW (ref 150–400)
RBC: 4.05 MIL/uL — ABNORMAL LOW (ref 4.22–5.81)
RDW: 15.6 % — ABNORMAL HIGH (ref 11.5–15.5)
WBC: 4.4 10*3/uL (ref 4.0–10.5)

## 2017-06-16 LAB — LIPASE, BLOOD: Lipase: 39 U/L (ref 11–51)

## 2017-06-16 LAB — I-STAT CG4 LACTIC ACID, ED
Lactic Acid, Venous: 2 mmol/L (ref 0.5–1.9)
Lactic Acid, Venous: 2.64 mmol/L (ref 0.5–1.9)

## 2017-06-16 MED ORDER — VANCOMYCIN HCL 10 G IV SOLR
2000.0000 mg | Freq: Once | INTRAVENOUS | Status: AC
  Administered 2017-06-17: 2000 mg via INTRAVENOUS
  Filled 2017-06-16: qty 2000

## 2017-06-16 MED ORDER — SODIUM CHLORIDE 0.9 % IV SOLN
2.0000 g | Freq: Once | INTRAVENOUS | Status: AC
  Administered 2017-06-16: 2 g via INTRAVENOUS
  Filled 2017-06-16: qty 2

## 2017-06-16 MED ORDER — SODIUM CHLORIDE 0.9 % IV BOLUS (SEPSIS)
1000.0000 mL | Freq: Once | INTRAVENOUS | Status: AC
  Administered 2017-06-16: 1000 mL via INTRAVENOUS

## 2017-06-16 MED ORDER — METRONIDAZOLE 500 MG PO TABS: 500.0000 mg | ORAL_TABLET | Freq: Three times a day (TID) | ORAL | 0 refills | Status: DC

## 2017-06-16 MED ORDER — SODIUM CHLORIDE 0.9 % IV SOLN
1000.0000 mL | INTRAVENOUS | Status: DC
  Administered 2017-06-16 – 2017-06-18 (×5): 1000 mL via INTRAVENOUS

## 2017-06-16 MED ORDER — ACETAMINOPHEN 325 MG PO TABS
650.0000 mg | ORAL_TABLET | Freq: Once | ORAL | Status: AC
  Administered 2017-06-16: 650 mg via ORAL
  Filled 2017-06-16: qty 2

## 2017-06-16 MED ORDER — LIDOCAINE HCL URETHRAL/MUCOSAL 2 % EX GEL
1.0000 "application " | Freq: Once | CUTANEOUS | Status: AC | PRN
  Administered 2017-06-16: 1 via URETHRAL
  Filled 2017-06-16: qty 5

## 2017-06-16 NOTE — ED Triage Notes (Signed)
Pt presents from [facility] from home for urinary retention. EMS reports that pt had catheter removed today and has not been able to urinate. Pt reports no urinary output today.

## 2017-06-16 NOTE — ED Notes (Signed)
I gave critical I Stat CG4 result to PA [facility]

## 2017-06-16 NOTE — ED Provider Notes (Cosign Needed Addendum)
[facility] DEPT Provider Note   CSN: [id number] Arrival date & time: 06/16/17  2011     History   Chief Complaint Chief Complaint  Patient presents with  . Urinary Retention    HPI Gary Yates. is a 84 y.o. male with history of anemia, anxiety, A. fib, COPD, CAD, diabetic peripheral neuropathy, type 2 diabetes, GERD, non-Hodgkin's lymphoma of the testes, hyperlipidemia, hypertension, low testosterone presents for evaluation of acute onset of urinary retention.  He was recently admitted for sepsis likely due to UTI with acute urinary retention thought to be secondary to BPH.  He did require a Foley catheter insertion at the time.  He was discharged on 06/12/2017 4 days ago.  The Foley catheter remained in place.  Today he followed up with his urologist who removed the Foley catheter.  He states that since then, he has not been able to urinate at all.  Bladder scan in the ED showed 800 cc of urine in the bladder but upon catheterization, he had an output of 1 L of urine. He states that today he has "just not felt well ".  He endorses urinary pressure which improved after catheterization in the ED.  He also endorses generalized fatigue.  Has had 3 episodes of watery nonbloody diarrhea yesterday.  Denies shortness of breath, chest pain.  He is unsure if he has had any fevers.  HPI  Past Medical History:  Diagnosis Date  . Anemia in chronic renal disease 05/07/2017  . Anxiety   . Atrial fibrillation ([place])   . COPD (chronic obstructive pulmonary disease) ([place])    pt. denies  . Coronary artery disease    a. h/o Overlapping stents RCA;  b. 06/2011 Cath: patent stents, nonobs dzs, NL EF.  . Diabetic peripheral neuropathy ([place])   . Diffuse non-Hodgkin's lymphoma of testis ([place]) 09/28/2015  . DM (diabetes mellitus) ([place])    Type 2, peripheral neuropathy.  . Dyspnea    with exertion  . Dysrhythmia   . GERD (gastroesophageal reflux disease)   . Headache   .  History of bronchitis   . History of kidney stones   . History of radiation therapy 02/19/16 - 03/13/16   Testis/Scrotum: 32.4 Gy in 18 fractions  . History of radiation therapy 08/07/16-08/20/16   left adrenal gland mass treated to 30 Gy in 10 fractions  . Hyperlipidemia   . Hypertension   . Low testosterone   . Nephrolithiasis   . Osteoarthritis    shoulder  . Restless leg   . SVT (supraventricular tachycardia) ([place])   . Urinary frequency   . Wears partial dentures    upper and lower    Patient Active Problem List   Diagnosis Date Noted  . Sepsis ([place]) 06/06/2017  . Anemia in chronic renal disease 05/07/2017  . Long term (current) use of anticoagulants 12/10/2016  . V-tach ([place]) 07/02/2016  . Protein-calorie malnutrition, severe 07/02/2016  . Bilateral lower extremity edema 07/01/2016  . Bilateral leg edema 06/11/2016  . Diffuse non-Hodgkin's lymphoma of testis ([place]) 09/28/2015  . Lumbosacral spondylosis with radiculopathy 02/07/2015  . At high risk for falls 05/05/2014  . Encounter for therapeutic drug monitoring 03/15/2013  . Chest pain 02/03/2013  . Obesity (BMI 30-39.9) 09/21/2012  . Poorly controlled type 2 diabetes mellitus with neuropathy ([place]) 07/03/2012  . Chronic kidney disease, stage III (moderate) ([place]) 07/03/2012  . Restless leg syndrome 12/04/2011  . Long term current use of anticoagulant therapy 07/15/2011  .  Paroxysmal atrial fibrillation ([place]) 06/21/2011  . Upper extremity weakness 06/21/2011  . Midsternal chest pain 06/19/2011  . Coronary artery disease   . Hypertension   . Hyperlipidemia   . COPD (chronic obstructive pulmonary disease) ([place])   . GERD (gastroesophageal reflux disease)   . Peripheral neuropathy   . Dyslipidemia 03/27/2011  . Diabetic peripheral neuropathy ([place]) 12/17/2010  . Hypotestosteronism 05/07/2010  . LEG CRAMPS, IDIOPATHIC 08/14/2009  . Insomnia 08/14/2009  . Malignant neoplasm of skin of face 08/14/2009  . OBESITY 08/02/2009    . HYPERTENSION, HEART CONTROLLED W/O ASSOC CHF 03/28/2008  . CAD, NATIVE VESSEL 03/28/2008  . G E R D 01/12/2007  . Morbid obesity ([place]) 09/15/2006  . Hyperlipidemia, mixed 07/01/2006  . Essential hypertension 07/01/2006  . Coronary atherosclerosis 07/01/2006  . COPD 07/01/2006  . OSTEOARTHRITIS 07/01/2006    Past Surgical History:  Procedure Laterality Date  . APPENDECTOMY    . [place]  . CARDIAC CATHETERIZATION  01/2013  . CATARACT EXTRACTION, BILATERAL    . CHOLECYSTECTOMY    . COLONOSCOPY    . CORONARY ANGIOPLASTY  2004  . EYE SURGERY Bilateral    cataracts  . IR GENERIC HISTORICAL  10/05/2015   IR US GUIDE VASC ACCESS RIGHT 10/05/2015 Marybelle Killings, MD [facility]INTER[facility]  . IR GENERIC HISTORICAL  10/05/2015   IR FLUORO GUIDE PORT INSERTION RIGHT 10/05/2015 Marybelle Killings, MD [facility]  . LEFT HEART CATHETERIZATION WITH CORONARY ANGIOGRAM N/A 06/18/2011   Procedure: LEFT HEART CATHETERIZATION WITH CORONARY ANGIOGRAM;  Surgeon: Peter M Martinique, MD;  Location[facility] LAB;  Service: Cardiovascular;  Laterality: N/A;  . LEFT HEART CATHETERIZATION WITH CORONARY ANGIOGRAM N/A 01/27/2013   Procedure: LEFT HEART CATHETERIZATION WITH CORONARY ANGIOGRAM;  Surgeon: Burnell Blanks, MD;  Location: [facility] CATH LAB;  Service: Cardiovascular;  Laterality: N/A;  . LUMBAR LAMINECTOMY/DECOMPRESSION MICRODISCECTOMY N/A 02/07/2015   Procedure: Lumbar three-Sacral one Decompression;  Surgeon: Kevan Ny Ditty, MD;  Location: [facility] NEURO ORS;  Service: Neurosurgery;  Laterality: N/A;  L3 to S1 Decompression  . MULTIPLE TOOTH EXTRACTIONS    . ORCHIECTOMY Right 09/01/2015   Procedure: RIGHT ORCHIECTOMY;  Surgeon: Kathie Rhodes, MD;  Location: [facility] ORS;  Service: Urology;  Laterality: Right;  . ROTATOR CUFF REPAIR Left         Home Medications    Prior to Admission medications   Medication Sig Start Date End Date Taking? Authorizing Provider  acetaminophen (TYLENOL) 500 MG tablet Take  1,000 mg by mouth every 6 (six) hours as needed (for pain/fever/headaches.).    Yes [provider]  atorvastatin (LIPITOR) 40 MG tablet TAKE 1 TABLET BY MOUTH EVERY DAY 05/30/17  Yes [name], Alinda Sierras, MD  docusate sodium (COLACE) 50 MG capsule Take 1 capsule (50 mg total) by mouth 2 (two) times daily as needed (for constipation). 04/22/16  Yes [name], Alinda Sierras, MD  doxycycline (VIBRA-TABS) 100 MG tablet Take 1 tablet (100 mg total) by mouth 2 (two) times daily for 5 days. 06/12/17 06/17/17 Yes Lavina Hamman, MD  gabapentin (NEURONTIN) 300 MG capsule TAKE 2 CAPSULES BY MOUTH 3 TIMES A DAY 04/28/17  Yes [name], Alinda Sierras, MD  KLOR-CON M20 20 MEQ tablet TAKE 1 TABLET BY MOUTH EVERY DAY 05/19/17  Yes [name], Alinda Sierras, MD  magic mouthwash w/lidocaine SOLN Take 5 mLs by mouth 4 (four) times daily as needed for mouth pain. 1:1 ratio. DIPHENHYDRAMINE HCL (ANTIHISTAMINES - ETHANOLAMINES),ALUM & MAG HYDROXIDE-SIMETH,NYSTATIN (ANTI-INFECTIVES - THROAT),LIDOCAINE HCL (  ANESTHETICS TOPICAL ORAL) 04/01/17  Yes [place], Holli Humbles, NP  Melatonin 1 MG TABS Take 1 tablet by mouth at bedtime.    Yes [provider]  metFORMIN (GLUCOPHAGE) 500 MG tablet TAKE 1 TABLET BY MOUTH TWICE A DAY WITH A MEAL 09/16/16  Yes [name], Alinda Sierras, MD  metoprolol succinate (TOPROL-XL) 100 MG 24 hr tablet TAKE 1 TABLET BY MOUTH EVERY DAY IMMEDIATELY FOLLOWING A MEAL 03/28/17  Yes [name], Alinda Sierras, MD  metroNIDAZOLE (FLAGYL) 500 MG tablet Take 1 tablet (500 mg total) by mouth 3 (three) times daily for 10 days. 06/16/17 06/26/17 Yes Ennever, Rudell Cobb, MD  omega-3 acid ethyl esters (LOVAZA) 1 g capsule Take 1 g by mouth daily.   Yes [provider]  pramipexole (MIRAPEX) 1.5 MG tablet Take two tablets every night. 09/10/16  Yes [name], Alinda Sierras, MD  tamsulosin (FLOMAX) 0.4 MG CAPS capsule Take 0.4 mg by mouth at bedtime.   Yes [provider]  TOUJEO SOLOSTAR 300 UNIT/ML SOPN INJECT 20 UNITS INTO THE SKIN  DAILY AFTER BREAKFAST. 09/24/16  Yes [name], Alinda Sierras, MD  Trospium Chloride 60 MG CP24 Take 60 mg by mouth daily.   Yes [provider]  vitamin B-12 (CYANOCOBALAMIN) 1000 MCG tablet Take 1,000 mcg by mouth daily.   Yes [provider]  warfarin (COUMADIN) 5 MG tablet Take as directed by anticoagulation clinic Patient taking differently: Take 5-7.5 mg by mouth as directed. 7.5 MG on (Sunday, Monday, Tuesday, Thursday, Friday and Saturday) 5 MG on Wednesday 05/28/17  Yes [name], Alinda Sierras, MD  ACCU-CHEK AVIVA PLUS test strip TEST 3 TIMES A DAY Patient taking differently: TEST 2 TIMES A DAY 10/27/15   [name], Alinda Sierras, MD  clopidogrel (PLAVIX) 75 MG tablet TAKE 1 TABLET BY MOUTH DAILY Patient not taking: Reported on 06/05/2017 09/25/16   Burnell Blanks, MD  dexamethasone (DECADRON) 4 MG tablet Take 2 tablets one time a day for 2 days starting the day after chemotherapy. Take with food. Patient not taking: Reported on 06/16/2017 01/15/17   Volanda Napoleon, MD  furosemide (LASIX) 40 MG tablet Take 1 tablet (40 mg total) by mouth 2 (two) times daily. Patient not taking: Reported on 06/05/2017 07/03/16   Patrecia Pour, Christean Grief, MD  lactulose [facility]) 10 GM/15ML solution Take 15 mLs (10 g total) by mouth every 6 (six) hours as needed for mild constipation or moderate constipation. Patient not taking: Reported on 06/16/2017 02/17/17   [place], Holli Humbles, NP  nitroGLYCERIN (NITROSTAT) 0.4 MG SL tablet Place 1 tablet (0.4 mg total) under the tongue every 5 (five) minutes as needed. Chest pain 01/20/14   Burnell Blanks, MD  ondansetron (ZOFRAN) 8 MG tablet Take 1 tablet (8 mg total) by mouth 2 (two) times daily as needed. Start on day 6 of chemotherapy. Patient not taking: Reported on 06/05/2017 01/15/17   Volanda Napoleon, MD  pantoprazole (PROTONIX) 40 MG tablet TAKE 1 TABLET BY MOUTH EVERY DAY Patient not taking: Reported on 06/16/2017 12/09/16   [name], MD    polyethylene glycol powder (MIRALAX) powder Take 17g daily, may increase to 2 times daily if constipated. Patient taking differently: Take 0.5 Containers by mouth daily as needed for moderate constipation.  06/12/17   Lavina Hamman, MD  prochlorperazine (COMPAZINE) 10 MG tablet Take 1 tablet (10 mg total) by mouth every 6 (six) hours as needed (Nausea or vomiting). Patient not taking: Reported on 06/05/2017 01/15/17   Volanda Napoleon,  MD  temazepam (RESTORIL) 30 MG capsule TAKE ONE CAPSULE AT BEDTIME AS NEEDED SLEEP Patient not taking: Reported on 06/05/2017 11/04/16   [name], MD  metoprolol (LOPRESSOR) 50 MG tablet  06/08/10 03/27/11  [provider]  spironolactone (ALDACTONE) 25 MG tablet  01/14/11 03/27/11  [provider]    Family History Family History  Problem Relation Age of Onset  . Alzheimer's disease Mother   . Heart disease Mother   . Heart disease Father   . Migraines Father   . Ulcers Father   . Prostate cancer Brother   . Coronary artery disease Unknown        Male 1st degree relative <50  . Coronary artery disease Unknown        male 1st degree relative <60  . Heart disease Sister   . Obesity Sister        Morbid  . Arthritis Sister   . Heart disease Brother   . Arthritis Brother   . Sleep apnea Son   . Obesity Son   . Migraines Daughter   . Thyroid disease Daughter     Social History Social History   Tobacco Use  . Smoking status: Former Smoker    Packs/day: 1.50    Years: 20.00    Pack years: 30.00    Types: Cigarettes    Last attempt to quit: 04/05/1978    Years since quitting: 39.2  . Smokeless tobacco: Never Used  Substance Use Topics  . Alcohol use: No  . Drug use: No     Allergies   Ace inhibitors; Codeine; and Penicillins   Review of Systems Review of Systems  Constitutional: Positive for fever.  Respiratory: Negative for cough and shortness of breath.   Cardiovascular: Negative for chest pain.   Gastrointestinal: Positive for abdominal pain, diarrhea and nausea. Negative for vomiting.  Genitourinary: Positive for difficulty urinating.  All other systems reviewed and are negative.    Physical Exam Updated Vital Signs BP (!) 114/52 (BP Location: Left Arm)   Pulse 95   Temp (!) 100.9 F (38.3 C) (Rectal)   Resp (!) 23   Ht 5\' 10"  (1.778 m)   Wt 102.5 kg (226 lb)   SpO2 100%   BMI 32.43 kg/m   Physical Exam  Constitutional: He appears well-developed and well-nourished. No distress.  HENT:  Head: Normocephalic and atraumatic.  Eyes: Conjunctivae are normal. Right eye exhibits no discharge. Left eye exhibits no discharge.  Neck: Normal range of motion. Neck supple. No JVD present. No tracheal deviation present.  Cardiovascular: Regular rhythm.  Tachycardic, 2+ radial and DP/PT pulses bl, negative Homan's bl, 2+ nonpitting edema of the bilateral lower extremities.  Pulmonary/Chest: Effort normal.  Globally diminished breath sounds.  Equal rise and fall of chest, no increased work of breathing.  Speaking in full sentences without difficulty.  Abdominal: Soft. He exhibits no distension and no mass. There is tenderness. There is no rebound and no guarding.  Very mild tenderness to palpation in the left lower quadrant and epigastric region.  Murphy's sign absent, Rovsing's absent, no CVA tenderness.  Genitourinary:  Genitourinary Comments: Examination performed in the presence of a chaperone.  There is a candidal rash with satellite lesions noted to the groin bilaterally.  There is no testicular tenderness or scrotal swelling.  Foley catheter is in place, draining bloody-appearing urine.  Musculoskeletal: He exhibits no edema.  Neurological: He is alert.  Skin: Skin is warm and dry. No erythema.  Psychiatric: He has a normal mood and affect. His behavior is normal.  Nursing note and vitals reviewed.    ED Treatments / Results  Labs (all labs ordered are listed, but only  abnormal results are displayed) Labs Reviewed  URINALYSIS, ROUTINE W REFLEX MICROSCOPIC - Abnormal; Notable for the following components:      Result Value   APPearance CLOUDY (*)    Glucose, UA 50 (*)    Hgb urine dipstick LARGE (*)    Protein, ur 100 (*)    Leukocytes, UA LARGE (*)    RBC / HPF >50 (*)    WBC, UA >50 (*)    All other components within normal limits  COMPREHENSIVE METABOLIC PANEL - Abnormal; Notable for the following components:   Sodium 131 (*)    Chloride 98 (*)    Glucose, Bld 132 (*)    Creatinine, Ser 1.38 (*)    Calcium 8.8 (*)    Albumin 3.2 (*)    ALT 11 (*)    GFR calc non Af Amer 46 (*)    GFR calc Af Amer 53 (*)    All other components within normal limits  CBC WITH DIFFERENTIAL/PLATELET - Abnormal; Notable for the following components:   RBC 4.05 (*)    Hemoglobin 12.6 (*)    HCT 37.6 (*)    RDW 15.6 (*)    Platelets 92 (*)    All other components within normal limits  I-STAT CG4 LACTIC ACID, ED - Abnormal; Notable for the following components:   Lactic Acid, Venous 2.64 (*)    All other components within normal limits  I-STAT CG4 LACTIC ACID, ED - Abnormal; Notable for the following components:   Lactic Acid, Venous 2.00 (*)    All other components within normal limits  URINE CULTURE  CULTURE, BLOOD (ROUTINE X 2)  CULTURE, BLOOD (ROUTINE X 2)  C DIFFICILE QUICK SCREEN W PCR REFLEX  GASTROINTESTINAL PANEL BY PCR, STOOL (REPLACES STOOL CULTURE)  LIPASE, BLOOD    EKG None  Radiology No results found.  Procedures .Critical Care Performed by: Renita Papa, PA-C Authorized by: Renita Papa, PA-C   Critical care provider statement:    Critical care time (minutes):  40   Critical care time was exclusive of:  Separately billable procedures and treating other patients and teaching time   Critical care was necessary to treat or prevent imminent or life-threatening deterioration of the following conditions:  Sepsis   Critical care was  time spent personally by me on the following activities:  Discussions with consultants, development of treatment plan with patient or surrogate, examination of patient, evaluation of patient's response to treatment, obtaining history from patient or surrogate, ordering and review of laboratory studies and ordering and review of radiographic studies   (including critical care time)  Medications Ordered in ED Medications  0.9 %  sodium chloride infusion (1,000 mLs Intravenous New Bag/Given 06/16/17 2208)  vancomycin (VANCOCIN) 2,000 mg in sodium chloride 0.9 % 500 mL IVPB (has no administration in time range)  lidocaine (XYLOCAINE) 2 % jelly 1 application (1 application Urethral Given 06/16/17 2050)  sodium chloride 0.9 % bolus 1,000 mL (0 mLs Intravenous Stopped 06/16/17 2322)  acetaminophen (TYLENOL) tablet 650 mg (650 mg Oral Given 06/16/17 2208)  ceFEPIme (MAXIPIME) 2 g in sodium chloride 0.9 % 100 mL IVPB (2 g Intravenous New Bag/Given 06/16/17 2343)     Initial Impression / Assessment and Plan / ED Course  I have reviewed  the triage vital signs and the nursing notes.  Pertinent labs & imaging results that were available during my care of the patient were reviewed by me and considered in my medical decision making (see chart for details).     Patient presents for evaluation of urinary retention.  He was found to be febrile at 100.9 F, intermittently tachycardic while in the ED.  He is not hypotensive.  1 L output of urine after Foley placement.  UA shows gross hematuria and pyuria.  No bacteria.  He was recently admitted for urosepsis although no organisms were grown in the urine culture.  He does have an elevated lactate, meets sepsis criteria.  Code sepsis was called, broad-spectrum antibiotics were initiated and IV fluids were given.  Remainder of lab work reviewed by me shows improving pancytopenia, elevated creatinine of 1.38, bumped from 0.9 to 5 days ago.  He will require admission for  further evaluation.  Patient seen and evaluated by Dr. Thomasene Lot who agrees with assessment and plan at this time.   Final Clinical Impressions(s) / ED Diagnoses   Final diagnoses:  Sepsis, due to unspecified organism [facility])  Urinary retention    ED Discharge Orders    None       Debroah Baller 06/17/17 0024    Macarthur Critchley, MD 06/19/17 2335  9:24 PM Documented Critical Care time.    Renita Papa, PA-C 08/12/17 2124

## 2017-06-16 NOTE — Telephone Encounter (Signed)
Received a call from son [name] about his father.  Gary Yates is recently out of the hospital and has been having problems with diarrhea after eating.  Dr. Marin Olp recommended patient take Kaopectate per bottle instructions. Dr Marin Olp sent RX for Flagyl to patients pharmacy.  Reiterated to patients son that if this diarrhea continues he needs to contact their primary care physician to manage this.  Reminded patient to not let diarrhea continue as patient will get dehydrated and to call asap if diarrhea continues.

## 2017-06-16 NOTE — ED Notes (Signed)
I gave critical IStat CG4 result to PA [facility]

## 2017-06-16 NOTE — Progress Notes (Signed)
A consult was received from an ED physician for cefepime and vancomycin per pharmacy dosing.  The patient's profile has been reviewed for ht/wt/allergies/indication/available labs.   A one time order has been placed for Cefepime 2 Gm and Vancomycin 2 Gm.  Further antibiotics/pharmacy consults should be ordered by admitting physician if indicated.                       Thank you, Dorrene German 06/16/2017  11:20 PM

## 2017-06-17 DIAGNOSIS — D6959 Other secondary thrombocytopenia: Secondary | ICD-10-CM | POA: Diagnosis present

## 2017-06-17 DIAGNOSIS — N179 Acute kidney failure, unspecified: Secondary | ICD-10-CM | POA: Diagnosis present

## 2017-06-17 DIAGNOSIS — N390 Urinary tract infection, site not specified: Secondary | ICD-10-CM

## 2017-06-17 DIAGNOSIS — D631 Anemia in chronic kidney disease: Secondary | ICD-10-CM | POA: Diagnosis present

## 2017-06-17 DIAGNOSIS — N189 Chronic kidney disease, unspecified: Secondary | ICD-10-CM | POA: Diagnosis not present

## 2017-06-17 DIAGNOSIS — B958 Unspecified staphylococcus as the cause of diseases classified elsewhere: Secondary | ICD-10-CM | POA: Diagnosis not present

## 2017-06-17 DIAGNOSIS — I129 Hypertensive chronic kidney disease with stage 1 through stage 4 chronic kidney disease, or unspecified chronic kidney disease: Secondary | ICD-10-CM | POA: Diagnosis present

## 2017-06-17 DIAGNOSIS — E876 Hypokalemia: Secondary | ICD-10-CM | POA: Diagnosis present

## 2017-06-17 DIAGNOSIS — J449 Chronic obstructive pulmonary disease, unspecified: Secondary | ICD-10-CM | POA: Diagnosis present

## 2017-06-17 DIAGNOSIS — C8589 Other specified types of non-Hodgkin lymphoma, extranodal and solid organ sites: Secondary | ICD-10-CM | POA: Diagnosis not present

## 2017-06-17 DIAGNOSIS — D696 Thrombocytopenia, unspecified: Secondary | ICD-10-CM | POA: Diagnosis not present

## 2017-06-17 DIAGNOSIS — A419 Sepsis, unspecified organism: Secondary | ICD-10-CM | POA: Diagnosis not present

## 2017-06-17 DIAGNOSIS — N401 Enlarged prostate with lower urinary tract symptoms: Secondary | ICD-10-CM | POA: Diagnosis present

## 2017-06-17 DIAGNOSIS — E785 Hyperlipidemia, unspecified: Secondary | ICD-10-CM | POA: Diagnosis present

## 2017-06-17 DIAGNOSIS — E1140 Type 2 diabetes mellitus with diabetic neuropathy, unspecified: Secondary | ICD-10-CM | POA: Diagnosis not present

## 2017-06-17 DIAGNOSIS — E1165 Type 2 diabetes mellitus with hyperglycemia: Secondary | ICD-10-CM | POA: Diagnosis present

## 2017-06-17 DIAGNOSIS — I2510 Atherosclerotic heart disease of native coronary artery without angina pectoris: Secondary | ICD-10-CM | POA: Diagnosis present

## 2017-06-17 DIAGNOSIS — R791 Abnormal coagulation profile: Secondary | ICD-10-CM | POA: Diagnosis present

## 2017-06-17 DIAGNOSIS — E1122 Type 2 diabetes mellitus with diabetic chronic kidney disease: Secondary | ICD-10-CM | POA: Diagnosis present

## 2017-06-17 DIAGNOSIS — N183 Chronic kidney disease, stage 3 (moderate): Secondary | ICD-10-CM | POA: Diagnosis present

## 2017-06-17 DIAGNOSIS — I480 Paroxysmal atrial fibrillation: Secondary | ICD-10-CM | POA: Diagnosis present

## 2017-06-17 DIAGNOSIS — E871 Hypo-osmolality and hyponatremia: Secondary | ICD-10-CM | POA: Diagnosis present

## 2017-06-17 DIAGNOSIS — Z87442 Personal history of urinary calculi: Secondary | ICD-10-CM | POA: Diagnosis not present

## 2017-06-17 DIAGNOSIS — Z792 Long term (current) use of antibiotics: Secondary | ICD-10-CM | POA: Diagnosis not present

## 2017-06-17 DIAGNOSIS — Z794 Long term (current) use of insulin: Secondary | ICD-10-CM | POA: Diagnosis not present

## 2017-06-17 DIAGNOSIS — N136 Pyonephrosis: Secondary | ICD-10-CM | POA: Diagnosis present

## 2017-06-17 DIAGNOSIS — K219 Gastro-esophageal reflux disease without esophagitis: Secondary | ICD-10-CM | POA: Diagnosis present

## 2017-06-17 DIAGNOSIS — C8595 Non-Hodgkin lymphoma, unspecified, lymph nodes of inguinal region and lower limb: Secondary | ICD-10-CM | POA: Diagnosis present

## 2017-06-17 DIAGNOSIS — R197 Diarrhea, unspecified: Secondary | ICD-10-CM | POA: Diagnosis present

## 2017-06-17 DIAGNOSIS — R338 Other retention of urine: Secondary | ICD-10-CM | POA: Diagnosis present

## 2017-06-17 DIAGNOSIS — F419 Anxiety disorder, unspecified: Secondary | ICD-10-CM | POA: Diagnosis present

## 2017-06-17 DIAGNOSIS — R339 Retention of urine, unspecified: Secondary | ICD-10-CM | POA: Diagnosis present

## 2017-06-17 LAB — GASTROINTESTINAL PANEL BY PCR, STOOL (REPLACES STOOL CULTURE)

## 2017-06-17 LAB — C DIFFICILE QUICK SCREEN W PCR REFLEX
C Diff antigen: NEGATIVE
C Diff interpretation: NOT DETECTED
C Diff toxin: NEGATIVE

## 2017-06-17 LAB — GLUCOSE, CAPILLARY
Glucose-Capillary: 128 mg/dL — ABNORMAL HIGH (ref 65–99)
Glucose-Capillary: 118 mg/dL — ABNORMAL HIGH (ref 65–99)
Glucose-Capillary: 122 mg/dL — ABNORMAL HIGH (ref 65–99)
Glucose-Capillary: 104 mg/dL — ABNORMAL HIGH (ref 65–99)

## 2017-06-17 LAB — PROTIME-INR
INR: 8.98
Prothrombin Time: 72.8 s — ABNORMAL HIGH (ref 11.4–15.2)

## 2017-06-17 MED ORDER — ACETAMINOPHEN 500 MG PO TABS: 1000.0000 mg | ORAL_TABLET | Freq: Four times a day (QID) | ORAL | Status: DC | PRN

## 2017-06-17 MED ORDER — WARFARIN - PHARMACIST DOSING INPATIENT: Freq: Every day | Status: DC

## 2017-06-17 MED ORDER — DARIFENACIN HYDROBROMIDE ER 7.5 MG PO TB24
7.5000 mg | ORAL_TABLET | Freq: Every day | ORAL | Status: DC
  Administered 2017-06-17 – 2017-06-20 (×4): 7.5 mg via ORAL
  Filled 2017-06-17 (×4): qty 1

## 2017-06-17 MED ORDER — INSULIN ASPART 100 UNIT/ML ~~LOC~~ SOLN
0.0000 [IU] | Freq: Three times a day (TID) | SUBCUTANEOUS | Status: DC
  Administered 2017-06-17 – 2017-06-20 (×8): 1 [IU] via SUBCUTANEOUS

## 2017-06-17 MED ORDER — ATORVASTATIN CALCIUM 40 MG PO TABS
40.0000 mg | ORAL_TABLET | Freq: Every day | ORAL | Status: DC
  Administered 2017-06-17 – 2017-06-19 (×3): 40 mg via ORAL
  Filled 2017-06-17 (×3): qty 1

## 2017-06-17 MED ORDER — TROSPIUM CHLORIDE ER 60 MG PO CP24: 60.0000 mg | ORAL_CAPSULE | Freq: Every day | ORAL | Status: DC

## 2017-06-17 MED ORDER — MAGIC MOUTHWASH W/LIDOCAINE
5.0000 mL | Freq: Four times a day (QID) | ORAL | Status: DC | PRN
  Administered 2017-06-18 – 2017-06-19 (×2): 5 mL via ORAL
  Filled 2017-06-17 (×3): qty 5

## 2017-06-17 MED ORDER — ONDANSETRON HCL 4 MG PO TABS: 4.0000 mg | ORAL_TABLET | Freq: Four times a day (QID) | ORAL | Status: DC | PRN

## 2017-06-17 MED ORDER — PRAMIPEXOLE DIHYDROCHLORIDE 1 MG PO TABS
3.0000 mg | ORAL_TABLET | Freq: Every day | ORAL | Status: DC
  Administered 2017-06-17 – 2017-06-19 (×3): 3 mg via ORAL
  Filled 2017-06-17 (×3): qty 3

## 2017-06-17 MED ORDER — VANCOMYCIN HCL IN DEXTROSE 1-5 GM/200ML-% IV SOLN
1000.0000 mg | INTRAVENOUS | Status: DC
  Administered 2017-06-18: 1000 mg via INTRAVENOUS
  Filled 2017-06-17: qty 200

## 2017-06-17 MED ORDER — GABAPENTIN 300 MG PO CAPS
600.0000 mg | ORAL_CAPSULE | Freq: Three times a day (TID) | ORAL | Status: DC
  Administered 2017-06-17 – 2017-06-20 (×11): 600 mg via ORAL
  Filled 2017-06-17 (×11): qty 2

## 2017-06-17 MED ORDER — ACETAMINOPHEN 500 MG PO TABS
1000.0000 mg | ORAL_TABLET | Freq: Four times a day (QID) | ORAL | Status: DC | PRN
  Administered 2017-06-17 – 2017-06-19 (×6): 1000 mg via ORAL
  Filled 2017-06-17 (×6): qty 2

## 2017-06-17 MED ORDER — SODIUM CHLORIDE 0.9% FLUSH
10.0000 mL | INTRAVENOUS | Status: DC | PRN
  Administered 2017-06-18 – 2017-06-20 (×2): 10 mL
  Filled 2017-06-17 (×2): qty 40

## 2017-06-17 MED ORDER — ACETAMINOPHEN 650 MG RE SUPP: 650.0000 mg | Freq: Four times a day (QID) | RECTAL | Status: DC | PRN

## 2017-06-17 MED ORDER — ONDANSETRON HCL 4 MG/2ML IJ SOLN
4.0000 mg | Freq: Four times a day (QID) | INTRAMUSCULAR | Status: DC | PRN
  Administered 2017-06-19: 4 mg via INTRAVENOUS
  Filled 2017-06-17: qty 2

## 2017-06-17 MED ORDER — SODIUM CHLORIDE 0.9 % IV SOLN
1.0000 g | Freq: Three times a day (TID) | INTRAVENOUS | Status: DC
  Administered 2017-06-17 – 2017-06-19 (×9): 1 g via INTRAVENOUS
  Filled 2017-06-17 (×10): qty 1

## 2017-06-17 MED ORDER — TRAMADOL HCL 50 MG PO TABS
50.0000 mg | ORAL_TABLET | Freq: Four times a day (QID) | ORAL | Status: DC | PRN
  Administered 2017-06-17 – 2017-06-18 (×3): 50 mg via ORAL
  Filled 2017-06-17 (×3): qty 1

## 2017-06-17 MED ORDER — INSULIN GLARGINE 100 UNIT/ML ~~LOC~~ SOLN
10.0000 [IU] | Freq: Every day | SUBCUTANEOUS | Status: DC
  Administered 2017-06-17 – 2017-06-20 (×4): 10 [IU] via SUBCUTANEOUS
  Filled 2017-06-17 (×4): qty 0.1

## 2017-06-17 MED ORDER — GABAPENTIN 300 MG PO CAPS: 600.0000 mg | ORAL_CAPSULE | Freq: Three times a day (TID) | ORAL | Status: DC

## 2017-06-17 MED ORDER — POLYETHYLENE GLYCOL 3350 17 G PO PACK: 17.0000 g | PACK | Freq: Every day | ORAL | Status: DC | PRN

## 2017-06-17 MED ORDER — ACETAMINOPHEN 325 MG PO TABS
650.0000 mg | ORAL_TABLET | Freq: Four times a day (QID) | ORAL | Status: DC | PRN
  Administered 2017-06-17: 650 mg via ORAL
  Filled 2017-06-17: qty 2

## 2017-06-17 MED ORDER — VITAMIN B-12 1000 MCG PO TABS
1000.0000 ug | ORAL_TABLET | Freq: Every day | ORAL | Status: DC
Start: 2017-06-17 — End: 2017-06-20
  Administered 2017-06-17 – 2017-06-20 (×4): 1000 ug via ORAL
  Filled 2017-06-17 (×4): qty 1

## 2017-06-17 MED ORDER — DOCUSATE SODIUM 50 MG PO CAPS
50.0000 mg | ORAL_CAPSULE | Freq: Two times a day (BID) | ORAL | Status: DC | PRN
  Filled 2017-06-17: qty 1

## 2017-06-17 MED ORDER — OMEGA-3-ACID ETHYL ESTERS 1 G PO CAPS
1.0000 g | ORAL_CAPSULE | Freq: Every day | ORAL | Status: DC
  Administered 2017-06-17 – 2017-06-20 (×4): 1 g via ORAL
  Filled 2017-06-17 (×4): qty 1

## 2017-06-17 NOTE — H&P (Signed)
History and Physical    Gary Yates. SKA:[id number] DOB: Mar 12, 1933 DOA: 06/16/2017  PCP: [name], MD  Patient coming from: Home  I have personally briefly reviewed patient's old medical records in [place]  Chief Complaint: Urinary retention  HPI: Gary Yates. is a 84 y.o. male with medical history significant of A.Fib, COPD, CAD, diabetes, non-Hodgkin's lymphoma status post chemo and radiation therapy, hyperlipidemia, hypertension.  Patient presents to the ED with c/o urinary retention.  He was recently admitted for urinary retention and a CNS UTI from 5/31 to 6/6.  Ultimately discharged on doxycycline with foley catheter in place.  Today urology removed catheter in office.  Since that time he has been unable to urinate and developed suprapubic pain.  Presents to ED for this.   ED Course: In the ED bladder scan shows > 800cc urine in bladder.  Foley placed and over 1L UOP.  He is also noted to be febrile Tm 100.9, tachycardic to 113, have borderline BPs in the 203T-597C systolic.  UA is strongly suspicious of UTI with large leuks, > 50 WBC.  Started on cefepime by EDP.   Review of Systems: As per HPI otherwise 10 point review of systems negative.   Past Medical History:  Diagnosis Date  . Anemia in chronic renal disease 05/07/2017  . Anxiety   . Atrial fibrillation ([place])   . COPD (chronic obstructive pulmonary disease) ([place])    pt. denies  . Coronary artery disease    a. h/o Overlapping stents RCA;  b. 06/2011 Cath: patent stents, nonobs dzs, NL EF.  . Diabetic peripheral neuropathy ([place])   . Diffuse non-Hodgkin's lymphoma of testis ([place]) 09/28/2015  . DM (diabetes mellitus) ([place])    Type 2, peripheral neuropathy.  . Dyspnea    with exertion  . Dysrhythmia   . GERD (gastroesophageal reflux disease)   . Headache   . History of bronchitis   . History of kidney stones   . History of radiation therapy 02/19/16 - 03/13/16   Testis/Scrotum: 32.4 Gy in  18 fractions  . History of radiation therapy 08/07/16-08/20/16   left adrenal gland mass treated to 30 Gy in 10 fractions  . Hyperlipidemia   . Hypertension   . Low testosterone   . Nephrolithiasis   . Osteoarthritis    shoulder  . Restless leg   . SVT (supraventricular tachycardia) ([place])   . Urinary frequency   . Wears partial dentures    upper and lower    Past Surgical History:  Procedure Laterality Date  . APPENDECTOMY    . [place]  . CARDIAC CATHETERIZATION  01/2013  . CATARACT EXTRACTION, BILATERAL    . CHOLECYSTECTOMY    . COLONOSCOPY    . CORONARY ANGIOPLASTY  2004  . EYE SURGERY Bilateral    cataracts  . IR GENERIC HISTORICAL  10/05/2015   IR US GUIDE VASC ACCESS RIGHT 10/05/2015 Marybelle Killings, MD [facility]INTER[facility] RAD  . IR GENERIC HISTORICAL  10/05/2015   IR FLUORO GUIDE PORT INSERTION RIGHT 10/05/2015 Marybelle Killings, MD [facility]  . LEFT HEART CATHETERIZATION WITH CORONARY ANGIOGRAM N/A 06/18/2011   Procedure: LEFT HEART CATHETERIZATION WITH CORONARY ANGIOGRAM;  Surgeon: Peter M Martinique, MD;  Location: [facility] CATH LAB;  Service: Cardiovascular;  Laterality: N/A;  . LEFT HEART CATHETERIZATION WITH CORONARY ANGIOGRAM N/A 01/27/2013   Procedure: LEFT HEART CATHETERIZATION WITH CORONARY ANGIOGRAM;  Surgeon: Burnell Blanks, MD;  Location: [facility] CATH LAB;  Service: Cardiovascular;  Laterality: N/A;  . LUMBAR LAMINECTOMY/DECOMPRESSION MICRODISCECTOMY N/A 02/07/2015   Procedure: Lumbar three-Sacral one Decompression;  Surgeon: Kevan Ny Ditty, MD;  Location: [facility] NEURO ORS;  Service: Neurosurgery;  Laterality: N/A;  L3 to S1 Decompression  . MULTIPLE TOOTH EXTRACTIONS    . ORCHIECTOMY Right 09/01/2015   Procedure: RIGHT ORCHIECTOMY;  Surgeon: Kathie Rhodes, MD;  Location: [facility] ORS;  Service: Urology;  Laterality: Right;  . ROTATOR CUFF REPAIR Left      reports that he quit smoking about 39 years ago. His smoking use included cigarettes. He has a 30.00 pack-year smoking  history. He has never used smokeless tobacco. He reports that he does not drink alcohol or use drugs.  Allergies  Allergen Reactions  . Ace Inhibitors Other (See Comments)    cough  . Codeine Nausea Only and Rash       . Penicillins Rash    Childhood allergy Has patient had a PCN reaction causing immediate rash, facial/tongue/throat swelling, SOB or lightheadedness with hypotension: Yes Has patient had a PCN reaction causing severe rash involving mucus membranes or skin necrosis: Yes Has patient had a PCN reaction that required hospitalization No Has patient had a PCN reaction occurring within the last 10 years: No If all of the above answers are "NO", then may proceed with Cephalosporin use.     Family History  Problem Relation Age of Onset  . Alzheimer's disease Mother   . Heart disease Mother   . Heart disease Father   . Migraines Father   . Ulcers Father   . Prostate cancer Brother   . Coronary artery disease Unknown        Male 1st degree relative <50  . Coronary artery disease Unknown        male 1st degree relative <60  . Heart disease Sister   . Obesity Sister        Morbid  . Arthritis Sister   . Heart disease Brother   . Arthritis Brother   . Sleep apnea Son   . Obesity Son   . Migraines Daughter   . Thyroid disease Daughter      Prior to Admission medications   Medication Sig Start Date End Date Taking? Authorizing Provider  acetaminophen (TYLENOL) 500 MG tablet Take 1,000 mg by mouth every 6 (six) hours as needed (for pain/fever/headaches.).    Yes [provider]  atorvastatin (LIPITOR) 40 MG tablet TAKE 1 TABLET BY MOUTH EVERY DAY 05/30/17  Yes [name], Alinda Sierras, MD  docusate sodium (COLACE) 50 MG capsule Take 1 capsule (50 mg total) by mouth 2 (two) times daily as needed (for constipation). 04/22/16  Yes [name], Alinda Sierras, MD  gabapentin (NEURONTIN) 300 MG capsule TAKE 2 CAPSULES BY MOUTH 3 TIMES A DAY 04/28/17  Yes [name], Alinda Sierras, MD    KLOR-CON M20 20 MEQ tablet TAKE 1 TABLET BY MOUTH EVERY DAY 05/19/17  Yes [name], Alinda Sierras, MD  magic mouthwash w/lidocaine SOLN Take 5 mLs by mouth 4 (four) times daily as needed for mouth pain. 1:1 ratio. DIPHENHYDRAMINE HCL (ANTIHISTAMINES - ETHANOLAMINES),ALUM & MAG HYDROXIDE-SIMETH,NYSTATIN (ANTI-INFECTIVES - THROAT),LIDOCAINE HCL (ANESTHETICS TOPICAL ORAL) 04/01/17  Yes [place], Holli Humbles, NP  Melatonin 1 MG TABS Take 1 tablet by mouth at bedtime.    Yes [provider]  metFORMIN (GLUCOPHAGE) 500 MG tablet TAKE 1 TABLET BY MOUTH TWICE A DAY WITH A MEAL 09/16/16  Yes [name], Alinda Sierras, MD  metoprolol succinate (TOPROL-XL) 100 MG 24  hr tablet TAKE 1 TABLET BY MOUTH EVERY DAY IMMEDIATELY FOLLOWING A MEAL 03/28/17  Yes [name], Alinda Sierras, MD  metroNIDAZOLE (FLAGYL) 500 MG tablet Take 1 tablet (500 mg total) by mouth 3 (three) times daily for 10 days. 06/16/17 06/26/17 Yes Ennever, Rudell Cobb, MD  omega-3 acid ethyl esters (LOVAZA) 1 g capsule Take 1 g by mouth daily.   Yes [provider]  pramipexole (MIRAPEX) 1.5 MG tablet Take two tablets every night. 09/10/16  Yes [name], Alinda Sierras, MD  tamsulosin (FLOMAX) 0.4 MG CAPS capsule Take 0.4 mg by mouth at bedtime.   Yes [provider]  TOUJEO SOLOSTAR 300 UNIT/ML SOPN INJECT 20 UNITS INTO THE SKIN DAILY AFTER BREAKFAST. 09/24/16  Yes [name], Alinda Sierras, MD  Trospium Chloride 60 MG CP24 Take 60 mg by mouth daily.   Yes [provider]  vitamin B-12 (CYANOCOBALAMIN) 1000 MCG tablet Take 1,000 mcg by mouth daily.   Yes [provider]  warfarin (COUMADIN) 5 MG tablet Take as directed by anticoagulation clinic Patient taking differently: Take 5-7.5 mg by mouth as directed. 7.5 MG on (Sunday, Monday, Tuesday, Thursday, Friday and Saturday) 5 MG on Wednesday 05/28/17  Yes [name], Alinda Sierras, MD  ACCU-CHEK AVIVA PLUS test strip TEST 3 TIMES A DAY Patient taking differently: TEST 2 TIMES A DAY 10/27/15   [name],  Alinda Sierras, MD  nitroGLYCERIN (NITROSTAT) 0.4 MG SL tablet Place 1 tablet (0.4 mg total) under the tongue every 5 (five) minutes as needed. Chest pain 01/20/14   Burnell Blanks, MD  polyethylene glycol powder [facility]) powder Take 17g daily, may increase to 2 times daily if constipated. Patient taking differently: Take 0.5 Containers by mouth daily as needed for moderate constipation.  06/12/17   Lavina Hamman, MD  metoprolol (LOPRESSOR) 50 MG tablet  06/08/10 03/27/11  [provider]  spironolactone (ALDACTONE) 25 MG tablet  01/14/11 03/27/11  [provider]    Physical Exam: Vitals:   06/16/17 2345 06/17/17 0000 06/17/17 0050 06/17/17 0100  BP: (!) 104/55 (!) 108/53 (!) 95/55 (!) 106/56  Pulse: 94 93 94 94  Resp: 18 18 15 16   Temp:      TempSrc:      SpO2: 98% 98% 98% 98%  Weight:      Height:        Constitutional: NAD, calm, comfortable Eyes: PERRL, lids and conjunctivae normal ENMT: Mucous membranes are moist. Posterior pharynx clear of any exudate or lesions.Normal dentition.  Neck: normal, supple, no masses, no thyromegaly Respiratory: clear to auscultation bilaterally, no wheezing, no crackles. Normal respiratory effort. No accessory muscle use.  Cardiovascular: Regular rate and rhythm, no murmurs / rubs / gallops. No extremity edema. 2+ pedal pulses. No carotid bruits.  Abdomen: no tenderness, no masses palpated. No hepatosplenomegaly. Bowel sounds positive.  Musculoskeletal: no clubbing / cyanosis. No joint deformity upper and lower extremities. Good ROM, no contractures. Normal muscle tone.  Skin: no rashes, lesions, ulcers. No induration Neurologic: CN 2-12 grossly intact. Sensation intact, DTR normal. Strength 5/5 in all 4.  Psychiatric: Normal judgment and insight. Alert and oriented x 3. Normal mood.    Labs on Admission: I have personally reviewed following labs and imaging studies  CBC: Recent Labs  Lab 06/10/17 0408 06/11/17 0354  06/12/17 0433 06/16/17 2201  WBC 6.7 4.7 2.6* 4.4  NEUTROABS 5.6 3.7 1.7 3.3  HGB 10.7* 10.4* 9.9* 12.6*  HCT 32.1* 30.9* 30.7* 37.6*  MCV 92.2 92.2 95.0 92.8  PLT 53* 54* 48* 92*   Basic Metabolic Panel: Recent Labs  Lab 06/10/17 0408 06/11/17 0354 06/12/17 0433 06/16/17 2201  NA 139 141 142 131*  K 3.0* 3.7 3.4* 3.7  CL 103 105 106 98*  CO2 24 29 29 23   GLUCOSE 129* 116* 126* 132*  BUN 12 14 14 19   CREATININE 1.03 0.91 0.92 1.38*  CALCIUM 8.1* 8.4* 8.2* 8.8*  MG 1.5* 2.0 1.9  --    GFR: Estimated Creatinine Clearance: 49.5 mL/min (A) (by C-G formula based on SCr of 1.38 mg/dL (H)). Liver Function Tests: Recent Labs  Lab 06/16/17 2201  AST 25  ALT 11*  ALKPHOS 94  BILITOT 0.5  PROT 7.0  ALBUMIN 3.2*   Recent Labs  Lab 06/16/17 2201  LIPASE 39   No results for input(s): AMMONIA in the last 168 hours. Coagulation Profile: Recent Labs  Lab 06/10/17 0408 06/11/17 0354 06/12/17 0433  INR 1.47 1.86 2.46   Cardiac Enzymes: No results for input(s): CKTOTAL, CKMB, CKMBINDEX, TROPONINI in the last 168 hours. BNP (last 3 results) No results for input(s): PROBNP in the last 8760 hours. HbA1C: No results for input(s): HGBA1C in the last 72 hours. CBG: Recent Labs  Lab 06/11/17 1209 06/11/17 1617 06/11/17 2056 06/12/17 0818 06/12/17 1141  GLUCAP 117* 164* 147* 122* 144*   Lipid Profile: No results for input(s): CHOL, HDL, LDLCALC, TRIG, CHOLHDL, LDLDIRECT in the last 72 hours. Thyroid Function Tests: No results for input(s): TSH, T4TOTAL, FREET4, T3FREE, THYROIDAB in the last 72 hours. Anemia Panel: No results for input(s): VITAMINB12, FOLATE, FERRITIN, TIBC, IRON, RETICCTPCT in the last 72 hours. Urine analysis:    Component Value Date/Time   COLORURINE YELLOW 06/16/2017 2049   APPEARANCEUR CLOUDY (A) 06/16/2017 2049   LABSPEC 1.009 06/16/2017 2049   PHURINE 6.0 06/16/2017 2049   GLUCOSEU 50 (A) 06/16/2017 2049   HGBUR LARGE (A) 06/16/2017 2049    HGBUR trace-intact 12/29/2006 0827   BILIRUBINUR NEGATIVE 06/16/2017 2049   [place] 06/16/2017 2049   PROTEINUR 100 (A) 06/16/2017 2049   UROBILINOGEN 0.2 12/29/2006 0827   NITRITE NEGATIVE 06/16/2017 2049   LEUKOCYTESUR LARGE (A) 06/16/2017 2049    Radiological Exams on Admission: No results found.  EKG: Independently reviewed.  Assessment/Plan Principal Problem:   Sepsis secondary to UTI [facility]) Active Problems:   Paroxysmal atrial fibrillation (HCC)   Poorly controlled type 2 diabetes mellitus with neuropathy (HCC)   Diffuse non-Hodgkin's lymphoma of testis (HCC)   Thrombocytopenia ([place])   Acute urinary retention    1. Sepsis secondary to UTI - 1. Continue cefepime, add Vanc due to h/o CNS UTI and sepsis admission just this past week. 2. BCx and UCx pending 3. IVF: 1L bolus and 125 cc/hr 4. Lactate cleared from 2.6 to 2.0 post 1L 2. DM2 - 1. Half home long acting (so Lantus 10u daily) 2. Sensitive SSI AC 3. Hold metformin 3. PAF - 1. Hold home metoprolol in setting of sepsis 2. Continue coumadin 3. Tele monitor 4. NHL - 1. Currently in remission 2. Anemia and thrombocytopenia numbers look better compared to admission earlier this week too. 5. Urinary retention - foley in place  DVT prophylaxis: Coumadin Code Status: Full Family Communication: Family at bedside Disposition Plan: Home after admit Consults called: None Admission status: Admit to inpatient   [place], [place] [facility] Pager [phone]  If 7AM-7PM, please contact day team taking care of patient [email or web address] Password [email or web address]  06/17/2017, 1:56 AM

## 2017-06-17 NOTE — Progress Notes (Signed)
[facility] PROGRESS NOTE    Progress Note  Gary Yates.  GMW:[id number] DOB: September 28, 1933 DOA: 06/16/2017 PCP: [name], MD     Brief Narrative:   Gary Virani. is an 84 y.o. male past medical history significant for chronic atrial fibrillation on chronic Coumadin, COPD diabetes mellitus non-Hodgkin's lymphoma status post chemo and radiation recently discharged from the hospital for sepsis in the setting of UTI, in the setting of BPH started on Flomax at that time urology was consulted at that time I recommended a Foley and to follow-up on follow-up on the day of admission urology remove the catheter in the office and have patient has been able to urinate since then and complaining of suprapubic pain with them as an out patient who presents with urinary retention   Assessment/Plan:   Sepsis secondary to UTI [facility]) acute urinary retention: Started on on IV vancomycin and cefepime due to recent Foley catheter.  3 of coag negative staph in urine. culture data has been sent. He was fluid resuscitated. Consult physical therapy.  Hyponatremia: In the setting of sepsis likely hypovolemic, will continue IV fluid hydration.  Thrombocytopenia Likely due to sepsis we will continue to monitor closely.  Paroxysmal atrial fibrillation (HCC) Rate controlled, resume Coumadin.  Poorly controlled type 2 diabetes mellitus with neuropathy ([place]) To have dose of long-acting insulin per sliding scale.  Hold metformin.  Diffuse non-Hodgkin's lymphoma of testis (HCC) On remission   DVT prophylaxis: coumadin Family Communication:family at bedside Disposition Plan/Barrier to D/C: home in 2-3 days Code Status:     Code Status Orders  (From admission, onward)        Start     Ordered   06/17/17 0137  Full code  Continuous     06/17/17 0146    Code Status History    Date Active Date Inactive Code Status Order ID Comments User Context   06/06/2017 0902 06/12/2017 1925 Full  Code [id number]  Mariel Aloe, MD Inpatient   07/01/2016 0532 07/03/2016 1946 Full Code [id number]  Norval Morton, MD ED   02/07/2015 1649 02/13/2015 1552 Full Code [id number]  Ditty, Kevan Ny, MD Inpatient   02/03/2013 2123 02/05/2013 1519 Full Code [id number]  Phillips Grout, MD ED    Advance Directive Documentation     Most Recent Value  Type of Advance Directive  Healthcare Power of Attorney  Pre-existing out of facility DNR order (yellow form or pink MOST form)  -  "MOST" Form in Place?  -        IV Access:    Peripheral IV   Procedures and diagnostic studies:   No results found.   Medical Consultants:    None.  Anti-Infectives:   IV vancomycin and cefepime.  Subjective:    Gary Yates. relates feels much better today.  Objective:    Vitals:   06/17/17 0310 06/17/17 0310 06/17/17 0316 06/17/17 0539  BP:  (!) 114/54  105/63  Pulse:  98  96  Resp:  19  18  Temp: 98.9 F (37.2 C)   100.1 F (37.8 C)  TempSrc: Oral   Oral  SpO2:  98%  97%  Weight:   101.4 kg (223 lb 8.7 oz)   Height:   5\' 10"  (1.778 m)     Intake/Output Summary (Last 24 hours) at 06/17/2017 0809 Last data filed at 06/17/2017 0600 Gross per 24 hour  Intake 2956.25 ml  Output 1200 ml  Net 1756.25  ml   Filed Weights   06/16/17 2028 06/17/17 0316  Weight: 102.5 kg (226 lb) 101.4 kg (223 lb 8.7 oz)    Exam: General exam: In no acute distress. Respiratory system: Good air movement and clear to auscultation. Cardiovascular system: S1 & S2 heard, RRR.  Gastrointestinal system: Abdomen is nondistended, soft and nontender.  Central nervous system: Alert and oriented. No focal neurological deficits. Extremities: No pedal edema. Skin: No rashes, lesions or ulcers Psychiatry: Judgement and insight appear normal. Mood & affect appropriate.    Data Reviewed:    Labs: Basic Metabolic Panel: Recent Labs  Lab 06/11/17 0354 06/12/17 0433 06/16/17 2201  NA 141 142 131*  K  3.7 3.4* 3.7  CL 105 106 98*  CO2 29 29 23   GLUCOSE 116* 126* 132*  BUN 14 14 19   CREATININE 0.91 0.92 1.38*  CALCIUM 8.4* 8.2* 8.8*  MG 2.0 1.9  --    GFR Estimated Creatinine Clearance: 49.3 mL/min (A) (by C-G formula based on SCr of 1.38 mg/dL (H)). Liver Function Tests: Recent Labs  Lab 06/16/17 2201  AST 25  ALT 11*  ALKPHOS 94  BILITOT 0.5  PROT 7.0  ALBUMIN 3.2*   Recent Labs  Lab 06/16/17 2201  LIPASE 39   No results for input(s): AMMONIA in the last 168 hours. Coagulation profile Recent Labs  Lab 06/11/17 0354 06/12/17 0433  INR 1.86 2.46    CBC: Recent Labs  Lab 06/11/17 0354 06/12/17 0433 06/16/17 2201  WBC 4.7 2.6* 4.4  NEUTROABS 3.7 1.7 3.3  HGB 10.4* 9.9* 12.6*  HCT 30.9* 30.7* 37.6*  MCV 92.2 95.0 92.8  PLT 54* 48* 92*   Cardiac Enzymes: No results for input(s): CKTOTAL, CKMB, CKMBINDEX, TROPONINI in the last 168 hours. BNP (last 3 results) No results for input(s): PROBNP in the last 8760 hours. CBG: Recent Labs  Lab 06/11/17 1617 06/11/17 2056 06/12/17 0818 06/12/17 1141 06/17/17 0748  GLUCAP 164* 147* 122* 144* 128*   D-Dimer: No results for input(s): DDIMER in the last 72 hours. Hgb A1c: No results for input(s): HGBA1C in the last 72 hours. Lipid Profile: No results for input(s): CHOL, HDL, LDLCALC, TRIG, CHOLHDL, LDLDIRECT in the last 72 hours. Thyroid function studies: No results for input(s): TSH, T4TOTAL, T3FREE, THYROIDAB in the last 72 hours.  Invalid input(s): FREET3 Anemia work up: No results for input(s): VITAMINB12, FOLATE, FERRITIN, TIBC, IRON, RETICCTPCT in the last 72 hours. Sepsis Labs: Recent Labs  Lab 06/11/17 0354 06/12/17 0433 06/16/17 2200 06/16/17 2201 06/16/17 2337  WBC 4.7 2.6*  --  4.4  --   LATICACIDVEN  --   --  2.64*  --  2.00*   Microbiology Recent Results (from the past 240 hour(s))  Urine Culture     Status: None   Collection Time: 06/11/17  1:54 PM  Result Value Ref Range Status     Specimen Description   Final    URINE, CATHETERIZED Performed at [facility] [address], [place], [place] [address]    Special Requests   Final    Normal Performed at [facility], [place] [address] [place], [place] [address]    Culture   Final    NO GROWTH Performed at [facility], [place] [address] [place], [place] [address]    Report Status 06/12/2017 FINAL  Final     Medications:   . atorvastatin  40 mg Oral q1800  . darifenacin  7.5 mg Oral Daily  .  gabapentin  600 mg Oral TID  . insulin aspart  0-9 Units Subcutaneous TID WC  . insulin glargine  10 Units Subcutaneous Daily  . omega-3 acid ethyl esters  1 g Oral Daily  . pramipexole  3 mg Oral QHS  . vitamin B-12  1,000 mcg Oral Daily   Continuous Infusions: . sodium chloride 1,000 mL (06/17/17 0309)  . ceFEPime (MAXIPIME) IV    . vancomycin       LOS: 0 days   [place] [facility] Pager [phone]  *Please refer to [email or web address], password [email or web address] to get updated schedule on who will round on this patient, as hospitalists switch teams weekly. If 7PM-7AM, please contact night-coverage at [email or web address], password [email or web address] for any overnight needs.  06/17/2017, 8:09 AM

## 2017-06-17 NOTE — Plan of Care (Signed)
  Problem: Education: Goal: Knowledge of General Education information will improve Outcome: Progressing   Problem: Health Behavior/Discharge Planning: Goal: Ability to manage health-related needs will improve Outcome: Progressing   Problem: Pain Managment: Goal: General experience of comfort will improve Outcome: Progressing   Problem: Safety: Goal: Ability to remain free from injury will improve Outcome: Progressing   

## 2017-06-17 NOTE — Progress Notes (Signed)
Assumed care of this patient from the previous RN and agree with prior assessment. Will continue to monitor this patient

## 2017-06-17 NOTE — ED Notes (Signed)
ED TO INPATIENT HANDOFF REPORT  Name/Age/Gender Gary Yates. 84 y.o. male  Code Status    Code Status Orders  (From admission, onward)        Start     Ordered   06/17/17 0137  Full code  Continuous     06/17/17 0146    Code Status History    Date Active Date Inactive Code Status Order ID Comments User Context   06/06/2017 0902 06/12/2017 1925 Full Code [id number]  Mariel Aloe, MD Inpatient   07/01/2016 0532 07/03/2016 1946 Full Code [id number]  Norval Morton, MD ED   02/07/2015 1649 02/13/2015 1552 Full Code [id number]  Ditty, Kevan Ny, MD Inpatient   02/03/2013 2123 02/05/2013 1519 Full Code [id number]  Phillips Grout, MD ED      Home/SNF/Other Home  Chief Complaint Urinary Retention  Level of Care/Admitting Diagnosis ED Disposition    ED Disposition Condition [facility] Area: [place] [[id number]]  Level of Care: Telemetry [5]  Admit to tele based on following criteria: Complex arrhythmia (Bradycardia/Tachycardia)  Diagnosis: Sepsis secondary to UTI [facility]) [[id number]]  Admitting Physician: Doreatha Massed  Attending Physician: Etta Quill [phone]  Estimated length of stay: past midnight tomorrow  Certification:: I certify this patient will need inpatient services for at least 2 midnights  PT Class (Do Not Modify): Inpatient [101]  PT Acc Code (Do Not Modify): Private [1]       Medical History Past Medical History:  Diagnosis Date  . Anemia in chronic renal disease 05/07/2017  . Anxiety   . Atrial fibrillation ([place])   . COPD (chronic obstructive pulmonary disease) ([place])    pt. denies  . Coronary artery disease    a. h/o Overlapping stents RCA;  b. 06/2011 Cath: patent stents, nonobs dzs, NL EF.  . Diabetic peripheral neuropathy ([place])   . Diffuse non-Hodgkin's lymphoma of testis ([place]) 09/28/2015  . DM (diabetes mellitus) ([place])    Type 2, peripheral neuropathy.  . Dyspnea    with exertion  . Dysrhythmia   .  GERD (gastroesophageal reflux disease)   . Headache   . History of bronchitis   . History of kidney stones   . History of radiation therapy 02/19/16 - 03/13/16   Testis/Scrotum: 32.4 Gy in 18 fractions  . History of radiation therapy 08/07/16-08/20/16   left adrenal gland mass treated to 30 Gy in 10 fractions  . Hyperlipidemia   . Hypertension   . Low testosterone   . Nephrolithiasis   . Osteoarthritis    shoulder  . Restless leg   . SVT (supraventricular tachycardia) ([place])   . Urinary frequency   . Wears partial dentures    upper and lower    Allergies Allergies  Allergen Reactions  . Ace Inhibitors Other (See Comments)    cough  . Codeine Nausea Only and Rash       . Penicillins Rash    Childhood allergy Has patient had a PCN reaction causing immediate rash, facial/tongue/throat swelling, SOB or lightheadedness with hypotension: Yes Has patient had a PCN reaction causing severe rash involving mucus membranes or skin necrosis: Yes Has patient had a PCN reaction that required hospitalization No Has patient had a PCN reaction occurring within the last 10 years: No If all of the above answers are "NO", then may proceed with Cephalosporin use.     IV Location/Drains/Wounds Patient Lines/Drains/Airways Status   Active Line/Drains/Airways  Name:   Placement date:   Placement time:   Site:   Days:   Implanted Port 10/05/15 Right Chest   10/05/15    -    Chest   621   Peripheral IV 06/16/17 Left Antecubital   06/16/17    2148    Antecubital   1   Urethral Catheter Dr. Alyson Ingles  Coude 20 Fr.   06/06/17    1303    Coude   11   Urethral Catheter Ilene C. RN Straight-tip 20 Fr.   06/16/17    2050    Straight-tip   1   Incision (Closed) 02/07/15 Back   02/07/15    1511     861   Incision (Closed) 09/01/15 Scrotum Other (Comment)   09/01/15    0806     655          Labs/Imaging Results for orders placed or performed during the hospital encounter of 06/16/17 (from the past 48  hour(s))  Urinalysis, Routine w reflex microscopic- may I&O cath if menses     Status: Abnormal   Collection Time: 06/16/17  8:49 PM  Result Value Ref Range   Color, Urine YELLOW YELLOW   APPearance CLOUDY (A) CLEAR   Specific Gravity, Urine 1.009 1.005 - 1.030   pH 6.0 5.0 - 8.0   Glucose, UA 50 (A) NEGATIVE mg/dL   Hgb urine dipstick LARGE (A) NEGATIVE   Bilirubin Urine NEGATIVE NEGATIVE   Ketones, ur NEGATIVE NEGATIVE mg/dL   Protein, ur 100 (A) NEGATIVE mg/dL   Nitrite NEGATIVE NEGATIVE   Leukocytes, UA LARGE (A) NEGATIVE   RBC / HPF >50 (H) 0 - 5 RBC/hpf   WBC, UA >50 (H) 0 - 5 WBC/hpf   Bacteria, UA NONE SEEN NONE SEEN   WBC Clumps PRESENT    Mucus PRESENT     Comment: Performed at [facility], [place] [address], [place], [place] [address]  I-Stat CG4 Lactic Acid, ED  (not at  [facility])     Status: Abnormal   Collection Time: 06/16/17 10:00 PM  Result Value Ref Range   Lactic Acid, Venous 2.64 (HH) 0.5 - 1.9 mmol/L   Comment NOTIFIED PHYSICIAN   Comprehensive metabolic panel     Status: Abnormal   Collection Time: 06/16/17 10:01 PM  Result Value Ref Range   Sodium 131 (L) 135 - 145 mmol/L   Potassium 3.7 3.5 - 5.1 mmol/L   Chloride 98 (L) 101 - 111 mmol/L   CO2 23 22 - 32 mmol/L   Glucose, Bld 132 (H) 65 - 99 mg/dL   BUN 19 6 - 20 mg/dL   Creatinine, Ser 1.38 (H) 0.61 - 1.24 mg/dL   Calcium 8.8 (L) 8.9 - 10.3 mg/dL   Total Protein 7.0 6.5 - 8.1 g/dL   Albumin 3.2 (L) 3.5 - 5.0 g/dL   AST 25 15 - 41 U/L   ALT 11 (L) 17 - 63 U/L   Alkaline Phosphatase 94 38 - 126 U/L   Total Bilirubin 0.5 0.3 - 1.2 mg/dL   GFR calc non Af Amer 46 (L) >60 mL/min   GFR calc Af Amer 53 (L) >60 mL/min    Comment: (NOTE) The eGFR has been calculated using the CKD EPI equation. This calculation has not been validated in all clinical situations. eGFR's persistently <60 mL/min signify possible Chronic Kidney Disease.    Anion gap 10 5 - 15    Comment: Performed at [facility], [place] [address], [place], [place] [address]  CBC WITH DIFFERENTIAL     Status: Abnormal   Collection Time: 06/16/17 10:01 PM  Result Value Ref Range   WBC 4.4 4.0 - 10.5 K/uL   RBC 4.05 (L) 4.22 - 5.81 MIL/uL   Hemoglobin 12.6 (L) 13.0 - 17.0 g/dL   HCT 37.6 (L) 39.0 - 52.0 %   MCV 92.8 78.0 - 100.0 fL   MCH 31.1 26.0 - 34.0 pg   MCHC 33.5 30.0 - 36.0 g/dL   RDW 15.6 (H) 11.5 - 15.5 %   Platelets 92 (L) 150 - 400 K/uL    Comment: REPEATED TO VERIFY SPECIMEN CHECKED FOR CLOTS PLATELET COUNT CONFIRMED BY SMEAR    Neutrophils Relative % 74 %   Neutro Abs 3.3 1.7 - 7.7 K/uL   Lymphocytes Relative 16 %   Lymphs Abs 0.7 0.7 - 4.0 K/uL   Monocytes Relative 9 %   Monocytes Absolute 0.4 0.1 - 1.0 K/uL   Eosinophils Relative 1 %   Eosinophils Absolute 0.0 0.0 - 0.7 K/uL   Basophils Relative 0 %   Basophils Absolute 0.0 0.0 - 0.1 K/uL   WBC Morphology TOXIC GRANULATION     Comment: Performed at [facility], [place] [address] [place], [place] [address]  Lipase, blood     Status: None   Collection Time: 06/16/17 10:01 PM  Result Value Ref Range   Lipase 39 11 - 51 U/L    Comment: Performed at [facility], [place] [address], [place], [place] [address]  I-Stat CG4 Lactic Acid, ED  (not at  [facility])     Status: Abnormal   Collection Time: 06/16/17 11:37 PM  Result Value Ref Range   Lactic Acid, Venous 2.00 (HH) 0.5 - 1.9 mmol/L   Comment NOTIFIED PHYSICIAN    *Note: Due to a large number of results and/or encounters for the requested time period, some results have not been displayed. A complete set of results can be found in Results Review.   No results found.  Pending Labs Unresulted Labs (From admission, onward)   Start     Ordered   06/16/17 2320  C difficile quick scan w PCR reflex  (C Difficile quick screen w PCR reflex panel)  Once, for 24 hours,   R     06/16/17 2319   06/16/17 2320  Gastrointestinal Panel by PCR ,  Stool  (Gastrointestinal Panel by PCR, Stool)  Once,   R     06/16/17 2319   06/16/17 2130  Blood Culture (routine x 2)  BLOOD CULTURE X 2,   STAT     06/16/17 2134   06/16/17 2043  Urine culture  STAT,   STAT     06/16/17 2042      Vitals/Pain Today's Vitals   06/17/17 0050 06/17/17 0100 06/17/17 0130 06/17/17 0200  BP: (!) 95/55 (!) 106/56 (!) 114/56 (!) 117/54  Pulse: 94 94 95 94  Resp: 15 16 16 13   Temp:      TempSrc:      SpO2: 98% 98% 99% 98%  Weight:      Height:      PainSc:        Isolation Precautions No active isolations  Medications Medications  0.9 %  sodium chloride infusion (0 mLs Intravenous Stopped 06/16/17 2322)  acetaminophen (TYLENOL) tablet 650 mg (has no administration in time range)    Or  acetaminophen (TYLENOL) suppository 650 mg (  has no administration in time range)  ondansetron (ZOFRAN) tablet 4 mg (has no administration in time range)    Or  ondansetron (ZOFRAN) injection 4 mg (has no administration in time range)  atorvastatin (LIPITOR) tablet 40 mg (has no administration in time range)  docusate sodium (COLACE) capsule 50 mg (has no administration in time range)  gabapentin (NEURONTIN) capsule 600 mg (has no administration in time range)  magic mouthwash w/lidocaine (has no administration in time range)  omega-3 acid ethyl esters (LOVAZA) capsule 1 g (has no administration in time range)  polyethylene glycol powder (GLYCOLAX/MIRALAX) container 127.5 g (has no administration in time range)  pramipexole (MIRAPEX) tablet 3 mg (has no administration in time range)  Trospium Chloride CP24 60 mg (has no administration in time range)  vitamin B-12 (CYANOCOBALAMIN) tablet 1,000 mcg (has no administration in time range)  insulin aspart (novoLOG) injection 0-9 Units (has no administration in time range)  insulin glargine (LANTUS) injection 10 Units (has no administration in time range)  ceFEPIme (MAXIPIME) 1 g in sodium chloride 0.9 % 100 mL IVPB (has  no administration in time range)  vancomycin (VANCOCIN) IVPB 1000 mg/200 mL premix (has no administration in time range)  lidocaine (XYLOCAINE) 2 % jelly 1 application (1 application Urethral Given 06/16/17 2050)  sodium chloride 0.9 % bolus 1,000 mL (0 mLs Intravenous Stopped 06/16/17 2322)  acetaminophen (TYLENOL) tablet 650 mg (650 mg Oral Given 06/16/17 2208)  vancomycin (VANCOCIN) 2,000 mg in sodium chloride 0.9 % 500 mL IVPB (0 mg Intravenous Stopped 06/17/17 0221)  ceFEPIme (MAXIPIME) 2 g in sodium chloride 0.9 % 100 mL IVPB (0 g Intravenous Stopped 06/17/17 0013)    Mobility Walks

## 2017-06-17 NOTE — Progress Notes (Signed)
Pharmacy Antibiotic Note  Gary Yates. is a 84 y.o. male admitted on 06/16/2017 with sepsis.  Pharmacy has been consulted for cefepime and vancomycin dosing.  Plan: Cefepime 2 Gm x1 then 1 Gm IV q8h Vancomycin 2 Gm x1 then 1 Gm IV q24h for est AUC = 462 Goal AUC = 400-500 F/u scr/cultures/levels  Height: 5\' 10"  (177.8 cm) Weight: 226 lb (102.5 kg) IBW/kg (Calculated) : 73  Temp (24hrs), Avg:100.2 F (37.9 C), Min:99.4 F (37.4 C), Max:100.9 F (38.3 C)  Recent Labs  Lab 06/10/17 0408 06/11/17 0354 06/12/17 0433 06/16/17 2200 06/16/17 2201 06/16/17 2337  WBC 6.7 4.7 2.6*  --  4.4  --   CREATININE 1.03 0.91 0.92  --  1.38*  --   LATICACIDVEN  --   --   --  2.64*  --  2.00*    Estimated Creatinine Clearance: 49.5 mL/min (A) (by C-G formula based on SCr of 1.38 mg/dL (H)).    Allergies  Allergen Reactions  . Ace Inhibitors Other (See Comments)    cough  . Codeine Nausea Only and Rash       . Penicillins Rash    Childhood allergy Has patient had a PCN reaction causing immediate rash, facial/tongue/throat swelling, SOB or lightheadedness with hypotension: Yes Has patient had a PCN reaction causing severe rash involving mucus membranes or skin necrosis: Yes Has patient had a PCN reaction that required hospitalization No Has patient had a PCN reaction occurring within the last 10 years: No If all of the above answers are "NO", then may proceed with Cephalosporin use.     Antimicrobials this admission: 6/10 cefepiime >>  6/10 vancomycin >>   Dose adjustments this admission:   Microbiology results:  BCx:   UCx:    Sputum:    MRSA PCR:   Thank you for allowing pharmacy to be a part of this patient's care.  Dorrene German 06/17/2017 1:58 AM

## 2017-06-17 NOTE — Progress Notes (Signed)
ANTICOAGULATION CONSULT NOTE - Initial Consult  Pharmacy Consult for coumadin Indication: atrial fibrillation  Allergies  Allergen Reactions  . Ace Inhibitors Other (See Comments)    cough  . Codeine Nausea Only and Rash       . Penicillins Rash    Childhood allergy Has patient had a PCN reaction causing immediate rash, facial/tongue/throat swelling, SOB or lightheadedness with hypotension: Yes Has patient had a PCN reaction causing severe rash involving mucus membranes or skin necrosis: Yes Has patient had a PCN reaction that required hospitalization No Has patient had a PCN reaction occurring within the last 10 years: No If all of the above answers are "NO", then may proceed with Cephalosporin use.     Patient Measurements: Height: 5\' 10"  (177.8 cm) Weight: 223 lb 8.7 oz (101.4 kg) IBW/kg (Calculated) : 73   Vital Signs: Temp: 100.4 F (38 C) (06/11 0945) Temp Source: Oral (06/11 0945) BP: 105/63 (06/11 0539) Pulse Rate: 96 (06/11 0539)  Labs: Recent Labs    06/16/17 2201 06/17/17 0909  HGB 12.6*  --   HCT 37.6*  --   PLT 92*  --   LABPROT  --  72.8*  INR  --  8.98*  CREATININE 1.38*  --     Estimated Creatinine Clearance: 49.3 mL/min (A) (by C-G formula based on SCr of 1.38 mg/dL (H)).   Medical History: Past Medical History:  Diagnosis Date  . Anemia in chronic renal disease 05/07/2017  . Anxiety   . Atrial fibrillation ([place])   . COPD (chronic obstructive pulmonary disease) ([name])    pt. denies  . Coronary artery disease    a. h/o Overlapping stents RCA;  b. 06/2011 Cath: patent stents, nonobs dzs, NL EF.  . Diabetic peripheral neuropathy ([place])   . Diffuse non-Hodgkin's lymphoma of testis ([place]) 09/28/2015  . DM (diabetes mellitus) ([place])    Type 2, peripheral neuropathy.  . Dyspnea    with exertion  . Dysrhythmia   . GERD (gastroesophageal reflux disease)   . Headache   . History of bronchitis   . History of kidney stones   . History of radiation  therapy 02/19/16 - 03/13/16   Testis/Scrotum: 32.4 Gy in 18 fractions  . History of radiation therapy 08/07/16-08/20/16   left adrenal gland mass treated to 30 Gy in 10 fractions  . Hyperlipidemia   . Hypertension   . Low testosterone   . Nephrolithiasis   . Osteoarthritis    shoulder  . Restless leg   . SVT (supraventricular tachycardia) ([place])   . Urinary frequency   . Wears partial dentures    upper and lower    Medications:  Medications Prior to Admission  Medication Sig Dispense Refill Last Dose  . acetaminophen (TYLENOL) 500 MG tablet Take 1,000 mg by mouth every 6 (six) hours as needed (for pain/fever/headaches.).    06/15/2017 at Unknown time  . atorvastatin (LIPITOR) 40 MG tablet TAKE 1 TABLET BY MOUTH EVERY DAY 90 tablet 0 Past Week at Unknown time  . docusate sodium (COLACE) 50 MG capsule Take 1 capsule (50 mg total) by mouth 2 (two) times daily as needed (for constipation). 90 capsule 0 Past Month at Unknown time  . gabapentin (NEURONTIN) 300 MG capsule TAKE 2 CAPSULES BY MOUTH 3 TIMES A DAY 540 capsule 2 06/16/2017 at Unknown time  . KLOR-CON M20 20 MEQ tablet TAKE 1 TABLET BY MOUTH EVERY DAY 90 tablet 0 Past Week at Unknown time  . magic mouthwash w/lidocaine  SOLN Take 5 mLs by mouth 4 (four) times daily as needed for mouth pain. 1:1 ratio. DIPHENHYDRAMINE HCL (ANTIHISTAMINES - ETHANOLAMINES),ALUM & MAG HYDROXIDE-SIMETH,NYSTATIN (ANTI-INFECTIVES - THROAT),LIDOCAINE HCL (ANESTHETICS TOPICAL ORAL) 600 mL 3 06/15/2017 at Unknown time  . Melatonin 1 MG TABS Take 1 tablet by mouth at bedtime.    Past Week at Unknown time  . metFORMIN (GLUCOPHAGE) 500 MG tablet TAKE 1 TABLET BY MOUTH TWICE A DAY WITH A MEAL 180 tablet 2 Past Week at Unknown time  . metoprolol succinate (TOPROL-XL) 100 MG 24 hr tablet TAKE 1 TABLET BY MOUTH EVERY DAY IMMEDIATELY FOLLOWING A MEAL 90 tablet 1 06/16/2017 at 1100  . metroNIDAZOLE (FLAGYL) 500 MG tablet Take 1 tablet (500 mg total) by mouth 3 (three) times daily  for 10 days. 30 tablet 0 06/16/2017 at Unknown time  . omega-3 acid ethyl esters (LOVAZA) 1 g capsule Take 1 g by mouth daily.   Past Week at Unknown time  . pramipexole (MIRAPEX) 1.5 MG tablet Take two tablets every night. 180 tablet 3 Past Week at Unknown time  . tamsulosin (FLOMAX) 0.4 MG CAPS capsule Take 0.4 mg by mouth at bedtime.   Past Week at Unknown time  . TOUJEO SOLOSTAR 300 UNIT/ML SOPN INJECT 20 UNITS INTO THE SKIN DAILY AFTER BREAKFAST. 15 mL 1 Past Week at Unknown time  . Trospium Chloride 60 MG CP24 Take 60 mg by mouth daily.   Past Week at Unknown time  . vitamin B-12 (CYANOCOBALAMIN) 1000 MCG tablet Take 1,000 mcg by mouth daily.   Past Week at Unknown time  . warfarin (COUMADIN) 5 MG tablet Take as directed by anticoagulation clinic (Patient taking differently: Take 5-7.5 mg by mouth as directed. 7.5 MG on (Sunday, Monday, Tuesday, Thursday, Friday and Saturday) 5 MG on Wednesday) 45 tablet 3 06/15/2017 at 2030  . ACCU-CHEK AVIVA PLUS test strip TEST 3 TIMES A DAY (Patient taking differently: TEST 2 TIMES A DAY) 300 each 5 Taking  . nitroGLYCERIN (NITROSTAT) 0.4 MG SL tablet Place 1 tablet (0.4 mg total) under the tongue every 5 (five) minutes as needed. Chest pain 25 tablet 6 unknown  . polyethylene glycol powder (MIRALAX) powder Take 17g daily, may increase to 2 times daily if constipated. (Patient taking differently: Take 0.5 Containers by mouth daily as needed for moderate constipation. ) 255 g 3 unknown    Assessment: 84 yo M on coumadin PTA for afib.  Admitted with urosepsis.  Pharmacy to dose coumadin for afib.  Home dose coumadin 7.5 mg daily except 5 mg on Wednesday - last dose 6/9. 06/17/2017 INR elevated at  8.98.  PLTC low at 92 in pt with hx of pancytopenia.  Pt was on flagyl x 1 day 6/10 - flagyl has a major interaction with coumadin causing elevated INR. RN reported blood clot in foley 6/11 am.   Goal of Therapy:  INR 2-3 Monitor platelets by anticoagulation  protocol: Yes   Plan:  No coumadin today Daily INR  Eudelia Bunch, Pharm.D. [phone] 06/17/2017 12:58 PM

## 2017-06-18 ENCOUNTER — Other Ambulatory Visit: Payer: Self-pay | Admitting: Family

## 2017-06-18 ENCOUNTER — Inpatient Hospital Stay (HOSPITAL_COMMUNITY): Payer: Medicare Other

## 2017-06-18 DIAGNOSIS — E1165 Type 2 diabetes mellitus with hyperglycemia: Secondary | ICD-10-CM

## 2017-06-18 DIAGNOSIS — E785 Hyperlipidemia, unspecified: Secondary | ICD-10-CM

## 2017-06-18 DIAGNOSIS — Z792 Long term (current) use of antibiotics: Secondary | ICD-10-CM

## 2017-06-18 DIAGNOSIS — I480 Paroxysmal atrial fibrillation: Secondary | ICD-10-CM

## 2017-06-18 DIAGNOSIS — N189 Chronic kidney disease, unspecified: Secondary | ICD-10-CM

## 2017-06-18 DIAGNOSIS — Z7901 Long term (current) use of anticoagulants: Secondary | ICD-10-CM

## 2017-06-18 DIAGNOSIS — K219 Gastro-esophageal reflux disease without esophagitis: Secondary | ICD-10-CM

## 2017-06-18 DIAGNOSIS — Z79899 Other long term (current) drug therapy: Secondary | ICD-10-CM

## 2017-06-18 DIAGNOSIS — D696 Thrombocytopenia, unspecified: Secondary | ICD-10-CM

## 2017-06-18 DIAGNOSIS — E1140 Type 2 diabetes mellitus with diabetic neuropathy, unspecified: Secondary | ICD-10-CM

## 2017-06-18 DIAGNOSIS — B958 Unspecified staphylococcus as the cause of diseases classified elsewhere: Secondary | ICD-10-CM

## 2017-06-18 DIAGNOSIS — M1990 Unspecified osteoarthritis, unspecified site: Secondary | ICD-10-CM

## 2017-06-18 DIAGNOSIS — E1122 Type 2 diabetes mellitus with diabetic chronic kidney disease: Secondary | ICD-10-CM

## 2017-06-18 DIAGNOSIS — I4891 Unspecified atrial fibrillation: Secondary | ICD-10-CM

## 2017-06-18 DIAGNOSIS — Z794 Long term (current) use of insulin: Secondary | ICD-10-CM

## 2017-06-18 DIAGNOSIS — Z87891 Personal history of nicotine dependence: Secondary | ICD-10-CM

## 2017-06-18 DIAGNOSIS — D631 Anemia in chronic kidney disease: Secondary | ICD-10-CM

## 2017-06-18 DIAGNOSIS — A419 Sepsis, unspecified organism: Secondary | ICD-10-CM

## 2017-06-18 DIAGNOSIS — R197 Diarrhea, unspecified: Secondary | ICD-10-CM

## 2017-06-18 DIAGNOSIS — R338 Other retention of urine: Secondary | ICD-10-CM

## 2017-06-18 DIAGNOSIS — I129 Hypertensive chronic kidney disease with stage 1 through stage 4 chronic kidney disease, or unspecified chronic kidney disease: Secondary | ICD-10-CM

## 2017-06-18 DIAGNOSIS — J449 Chronic obstructive pulmonary disease, unspecified: Secondary | ICD-10-CM

## 2017-06-18 LAB — URINE CULTURE: CULTURE: NO GROWTH

## 2017-06-18 LAB — GLUCOSE, CAPILLARY
GLUCOSE-CAPILLARY: 130 mg/dL — AB (ref 65–99)
Glucose-Capillary: 129 mg/dL — ABNORMAL HIGH (ref 65–99)
Glucose-Capillary: 114 mg/dL — ABNORMAL HIGH (ref 65–99)
Glucose-Capillary: 141 mg/dL — ABNORMAL HIGH (ref 65–99)

## 2017-06-18 LAB — PROTIME-INR
INR: 4.81
Prothrombin Time: 44.7 seconds — ABNORMAL HIGH (ref 11.4–15.2)

## 2017-06-18 LAB — CREATININE, SERUM
Creatinine, Ser: 0.95 mg/dL (ref 0.61–1.24)
GFR calc Af Amer: >60 mL/min (ref >60)

## 2017-06-18 MED ORDER — SODIUM CHLORIDE 0.9 % IV SOLN: 1000.0000 mL | INTRAVENOUS | Status: DC

## 2017-06-18 MED ORDER — VANCOMYCIN HCL 10 G IV SOLR
1250.0000 mg | INTRAVENOUS | Status: DC
  Filled 2017-06-18: qty 1250

## 2017-06-18 MED ORDER — IOPAMIDOL (ISOVUE-300) INJECTION 61%
INTRAVENOUS | Status: AC
  Filled 2017-06-18: qty 100

## 2017-06-18 MED ORDER — ADULT MULTIVITAMIN W/MINERALS CH
1.0000 | ORAL_TABLET | Freq: Every day | ORAL | Status: DC
  Administered 2017-06-18 – 2017-06-20 (×3): 1 via ORAL
  Filled 2017-06-18 (×3): qty 1

## 2017-06-18 MED ORDER — IOPAMIDOL (ISOVUE-300) INJECTION 61%
100.0000 mL | Freq: Once | INTRAVENOUS | Status: AC | PRN
Start: 2017-06-18 — End: 2017-06-18
  Administered 2017-06-18: 100 mL via INTRAVENOUS

## 2017-06-18 MED ORDER — PREMIER PROTEIN SHAKE
11.0000 [oz_av] | Freq: Two times a day (BID) | ORAL | Status: DC
  Administered 2017-06-18 – 2017-06-20 (×4): 11 [oz_av] via ORAL
  Filled 2017-06-18 (×5): qty 325.31

## 2017-06-18 NOTE — Consult Note (Signed)
Referral MD  Reason for Referral: Urinary retention; history of recurrent non-Hodgkin's lymphoma; elevated INR  Chief Complaint  Patient presents with  . Urinary Retention  : I am not going home until I am better.  HPI: Gary Yates is well-known to me.  He is a 84 year old white male.  He has a history of recurrent non-Hodgkin's lymphoma.  This was treated with aggressive chemotherapy that he actually tolerated well and so far has kept him in remission.  He was in the hospital a week or so ago with a Staphylococcus UTI.  This was treated.  He went home.  He has come back now.  He is having some more urinary issues.  I will know if he has urinary obstruction.  Is having some retention.  I think he was initially seen in the emergency room.  He is having some diarrhea.  His C. difficile is negative.  His labs when he came in on 10 June showed a white cell count is 4.4.  Hemoglobin 10.6.  Platelet count 92,000.  His BUN was 19 creatinine 1.38.  I think he needs a CT scan of his abdomen.  I do not know if he has prostate issues.  He was seen by urology.  He had an INR of 8.9.  This may have been a issue with him having some hematoma in the bladder that could have blocked his urethra.  His urine currently looks pretty clear.  He is getting weaker.  I think he needs some physical therapy.  He is had no obvious fever.  His appetite is okay.  He has had no nausea or vomiting.   Past Medical History:  Diagnosis Date  . Anemia in chronic renal disease 05/07/2017  . Anxiety   . Atrial fibrillation ([place])   . COPD (chronic obstructive pulmonary disease) ([place])    pt. denies  . Coronary artery disease    a. h/o Overlapping stents RCA;  b. 06/2011 Cath: patent stents, nonobs dzs, NL EF.  . Diabetic peripheral neuropathy ([place])   . 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 DM (diabetes mellitus) ([place])    Type 2, peripheral neuropathy.  . Dyspnea    with exertion  . Dysrhythmia   .  GERD (gastroesophageal reflux disease)   . Headache   . History of bronchitis   . History of kidney stones   . History of radiation therapy 02/19/16 - 03/13/16   Testis/Scrotum: 32.4 Gy in 18 fractions  . History of radiation therapy 08/07/16-08/20/16   left adrenal gland mass treated to 30 Gy in 10 fractions  . Hyperlipidemia   . Hypertension   . Low testosterone   . Nephrolithiasis   . Osteoarthritis    shoulder  . Restless leg   . SVT (supraventricular tachycardia) ([place])   . Urinary frequency   . Wears partial dentures    upper and lower  :  Past Surgical History:  Procedure Laterality Date  . APPENDECTOMY    . [place]  . CARDIAC CATHETERIZATION  01/2013  . CATARACT EXTRACTION, BILATERAL    . CHOLECYSTECTOMY    . COLONOSCOPY    . CORONARY ANGIOPLASTY  2004  . EYE SURGERY Bilateral    cataracts  . IR GENERIC HISTORICAL  10/05/2015   IR US GUIDE VASC ACCESS RIGHT 10/05/2015 Marybelle Killings, MD [facility]INTER[facility] RAD  . IR GENERIC HISTORICAL  10/05/2015   IR FLUORO GUIDE PORT INSERTION RIGHT 10/05/2015 Marybelle Killings, MD [facility]  . LEFT HEART CATHETERIZATION WITH CORONARY ANGIOGRAM N/A 06/18/2011   Procedure: LEFT HEART CATHETERIZATION WITH CORONARY ANGIOGRAM;  Surgeon: Andrick Rust M [place], MD;  Location: [facility] CATH LAB;  Service: Cardiovascular;  Laterality: N/A;  . LEFT HEART CATHETERIZATION WITH CORONARY ANGIOGRAM N/A 01/27/2013   Procedure: LEFT HEART CATHETERIZATION WITH CORONARY ANGIOGRAM;  Surgeon: Burnell Blanks, MD;  Location: [facility] CATH LAB;  Service: Cardiovascular;  Laterality: N/A;  . LUMBAR LAMINECTOMY/DECOMPRESSION MICRODISCECTOMY N/A 02/07/2015   Procedure: Lumbar three-Sacral one Decompression;  Surgeon: Kevan Ny Ditty, MD;  Location: [place] NEURO ORS;  Service: Neurosurgery;  Laterality: N/A;  L3 to S1 Decompression  . MULTIPLE TOOTH EXTRACTIONS    . ORCHIECTOMY Right 09/01/2015   Procedure: RIGHT ORCHIECTOMY;  Surgeon: Kathie Rhodes, MD;  Location: [facility] ORS;   Service: Urology;  Laterality: Right;  . ROTATOR CUFF REPAIR Left   :   Current Facility-Administered Medications:  .  0.9 %  sodium chloride infusion, 1,000 mL, Intravenous, Continuous, Fawze, Mina A, PA-C, Last Rate: 125 mL/hr at 06/18/17 0556, 1,000 mL at 06/18/17 0556 .  acetaminophen (TYLENOL) tablet 1,000 mg, 1,000 mg, Oral, Q6H PRN, Charlynne Cousins, MD, 1,000 mg at 06/18/17 0600 .  atorvastatin (LIPITOR) tablet 40 mg, 40 mg, Oral, q1800, Etta Quill, DO, 40 mg at 06/17/17 1711 .  ceFEPIme (MAXIPIME) 1 g in sodium chloride 0.9 % 100 mL IVPB, 1 g, Intravenous, Q8H, Dorrene German, RPH, Stopped at 06/18/17 0020 .  darifenacin (ENABLEX) 24 hr tablet 7.5 mg, 7.5 mg, Oral, Daily, Jennette Kettle M, DO, 7.5 mg at 06/17/17 1033 .  docusate sodium (COLACE) capsule 50 mg, 50 mg, Oral, BID PRN, Etta Quill, DO .  gabapentin (NEURONTIN) capsule 600 mg, 600 mg, Oral, TID, Kirby-Graham, Karsten Fells, NP, 600 mg at 06/18/17 0556 .  insulin aspart (novoLOG) injection 0-9 Units, 0-9 Units, Subcutaneous, TID WC, Etta Quill, DO, 1 Units at 06/17/17 1639 .  insulin glargine (LANTUS) injection 10 Units, 10 Units, Subcutaneous, Daily, Etta Quill, DO, 10 Units at 06/17/17 1033 .  magic mouthwash w/lidocaine, 5 mL, Oral, QID PRN, Etta Quill, DO .  omega-3 acid ethyl esters (LOVAZA) capsule 1 g, 1 g, Oral, Daily, Alcario Drought, Jared M, DO, 1 g at 06/17/17 1033 .  ondansetron (ZOFRAN) tablet 4 mg, 4 mg, Oral, Q6H PRN **OR** ondansetron (ZOFRAN) injection 4 mg, 4 mg, Intravenous, Q6H PRN, Alcario Drought, Jared M, DO .  polyethylene glycol (MIRALAX / GLYCOLAX) packet 17 g, 17 g, Oral, Daily PRN, Etta Quill, DO .  pramipexole (MIRAPEX) tablet 3 mg, 3 mg, Oral, QHS, [place], Jared M, DO, 3 mg at 06/17/17 2147 .  sodium chloride flush (NS) 0.9 % injection 10-40 mL, 10-40 mL, Intracatheter, PRN, Charlynne Cousins, MD, 10 mL at 06/18/17 0512 .  traMADol (ULTRAM) tablet 50 mg, 50 mg, Oral, Q6H  PRN, Charlynne Cousins, MD, 50 mg at 06/17/17 1733 .  vancomycin (VANCOCIN) IVPB 1000 mg/200 mL premix, 1,000 mg, Intravenous, Q24H, Dorrene German, RPH, Stopped at 06/18/17 0200 .  vitamin B-12 (CYANOCOBALAMIN) tablet 1,000 mcg, 1,000 mcg, Oral, Daily, Etta Quill, DO, 1,000 mcg at 06/17/17 1033 .  Warfarin - Pharmacist Dosing Inpatient, , Does not apply, q1800, Eudelia Bunch, RPH, Stopped at 06/17/17 1800  Facility-Administered Medications Ordered in Other Encounters:  .  acetaminophen (TYLENOL) tablet 650 mg, 650 mg, Oral, Once, [place], Sarah M, NP .  pegfilgrastim (NEULASTA ONPRO KIT) injection 6 mg, 6 mg, Subcutaneous,  Once, Volanda Napoleon, MD .  sodium chloride flush (NS) 0.9 % injection 10 mL, 10 mL, Intracatheter, PRN, Volanda Napoleon, MD, 10 mL at 02/27/17 1503 .  sodium chloride flush (NS) 0.9 % injection 10 mL, 10 mL, Intravenous, PRN, Volanda Napoleon, MD, 10 mL at 05/07/17 0938:  . atorvastatin  40 mg Oral q1800  . darifenacin  7.5 mg Oral Daily  . gabapentin  600 mg Oral TID  . insulin aspart  0-9 Units Subcutaneous TID WC  . insulin glargine  10 Units Subcutaneous Daily  . omega-3 acid ethyl esters  1 g Oral Daily  . pramipexole  3 mg Oral QHS  . vitamin B-12  1,000 mcg Oral Daily  . Warfarin - Pharmacist Dosing Inpatient   Does not apply q1800  :  Allergies  Allergen Reactions  . Ace Inhibitors Other (See Comments)    cough  . Codeine Nausea Only and Rash       . Penicillins Rash    Childhood allergy Has patient had a PCN reaction causing immediate rash, facial/tongue/throat swelling, SOB or lightheadedness with hypotension: Yes Has patient had a PCN reaction causing severe rash involving mucus membranes or skin necrosis: Yes Has patient had a PCN reaction that required hospitalization No Has patient had a PCN reaction occurring within the last 10 years: No If all of the above answers are "NO", then may proceed with Cephalosporin use.    :  Family History  Problem Relation Age of Onset  . Alzheimer's disease Mother   . Heart disease Mother   . Heart disease Father   . Migraines Father   . Ulcers Father   . Prostate cancer Brother   . Coronary artery disease Unknown        Male 1st degree relative <50  . Coronary artery disease Unknown        male 1st degree relative <60  . Heart disease Sister   . Obesity Sister        Morbid  . Arthritis Sister   . Heart disease Brother   . Arthritis Brother   . Sleep apnea Son   . Obesity Son   . Migraines Daughter   . Thyroid disease Daughter   :  Social History   Socioeconomic History  . Marital status: Married    Spouse name: Not on file  . Number of children: 3  . Years of education: Not on file  . Highest education level: Not on file  Occupational History  . Occupation: Retired    Fish farm manager: RETIRED  Social Needs  . Financial resource strain: Not on file  . Food insecurity:    Worry: Not on file    Inability: Not on file  . Transportation needs:    Medical: Not on file    Non-medical: Not on file  Tobacco Use  . Smoking status: Former Smoker    Packs/day: 1.50    Years: 20.00    Pack years: 30.00    Types: Cigarettes    Last attempt to quit: 04/05/1978    Years since quitting: 39.2  . Smokeless tobacco: Never Used  Substance and Sexual Activity  . Alcohol use: No  . Drug use: No  . Sexual activity: Not Currently  Lifestyle  . Physical activity:    Days per week: Not on file    Minutes per session: Not on file  . Stress: Not on file  Relationships  . Social connections:    Talks  on phone: Not on file    Gets together: Not on file    Attends religious service: Not on file    Active member of club or organization: Not on file    Attends meetings of clubs or organizations: Not on file    Relationship status: Not on file  . Intimate partner violence:    Fear of current or ex partner: Not on file    Emotionally abused: Not on file     Physically abused: Not on file    Forced sexual activity: Not on file  Other Topics Concern  . Not on file  Social History Narrative  . Not on file  :  Pertinent items are noted in HPI.  Exam: Patient Vitals for the past 24 hrs:  BP Temp Temp src Pulse Resp SpO2  06/17/17 2130 116/63 (!) 101.2 F (38.4 C) Oral (!) 105 18 99 %  06/17/17 1537 (!) 100/55 99.6 F (37.6 C) - 99 17 97 %  06/17/17 0945 - (!) 100.4 F (38 C) Oral - - -  06/17/17 0830 - (!) 100.9 F (38.3 C) Oral - - -     Recent Labs    06/16/17 2201  WBC 4.4  HGB 12.6*  HCT 37.6*  PLT 92*   Recent Labs    06/16/17 2201 06/18/17 0511  NA 131*  --   K 3.7  --   CL 98*  --   CO2 23  --   GLUCOSE 132*  --   BUN 19  --   CREATININE 1.38* 0.95  CALCIUM 8.8*  --     Blood smear review: None   Pathology: None    Assessment and Plan: Gary Yates is a 84 year old white male.  He has a past history of recurrent non-Hodgkin's lymphoma.  By his last PET scan, he was in remission.  His blood counts are doing better.  This is encouraging.  He does not look septic.  Again, I will see about a CT of the abdomen and pelvis.  Maybe this can give [place] an idea as to his prostate size and whether his prostate needs to be looked at and possibly have a TURB.  We will follow along.  We will try to help out as much as we can.  I know that he will get fantastic care from all staff up on 4 E.   Lattie Haw, MD  Romans 8:28

## 2017-06-18 NOTE — Care Management Note (Signed)
Case Management Note  Patient Details  Name: Gary Yates. MRN: [id number] Date of Birth: 07-24-1933  Subjective/Objective:  84 y/o m admitted w/UTI. Readmit-UTI. From home w/spouse. Active w/[facility]-rep Michelle aware & following for [place] orders.                  Action/Plan:d/c home w/HHC.   Expected Discharge Date:                  Expected Discharge Plan:  [place]  In-House Referral:     Discharge planning Services  CM Consult  Post Acute Care Choice:  Home Health(Active w/[facility]/PT/OT) Choice offered to:     DME Arranged:    DME Agency:     HH Arranged:    HH Agency:     Status of Service:  In process, will continue to follow  If discussed at Long Length of Stay Meetings, dates discussed:    Additional Comments:  Dessa Phi, RN 06/18/2017, 3:31 PM

## 2017-06-18 NOTE — Progress Notes (Signed)
[place] for coumadin Indication: atrial fibrillation  Allergies  Allergen Reactions  . Ace Inhibitors Other (See Comments)    cough  . Codeine Nausea Only and Rash       . Penicillins Rash    Childhood allergy Has patient had a PCN reaction causing immediate rash, facial/tongue/throat swelling, SOB or lightheadedness with hypotension: Yes Has patient had a PCN reaction causing severe rash involving mucus membranes or skin necrosis: Yes Has patient had a PCN reaction that required hospitalization No Has patient had a PCN reaction occurring within the last 10 years: No If all of the above answers are "NO", then may proceed with Cephalosporin use.     Patient Measurements: Height: 5\' 10"  (177.8 cm) Weight: 223 lb 8.7 oz (101.4 kg) IBW/kg (Calculated) : 73   Vital Signs: Temp: 98.9 F (37.2 C) (06/12 1232) Temp Source: Oral (06/12 0945) BP: 117/58 (06/12 1232) Pulse Rate: 92 (06/12 1232)  Labs: Recent Labs    06/16/17 2201 06/17/17 0909 06/18/17 0511  HGB 12.6*  --   --   HCT 37.6*  --   --   PLT 92*  --   --   LABPROT  --  72.8* 44.7*  INR  --  8.98* 4.81*  CREATININE 1.38*  --  0.95    Estimated Creatinine Clearance: 71.6 mL/min (by C-G formula based on SCr of 0.95 mg/dL).   Assessment: 84 yo M on coumadin PTA for afib.  Admitted with urosepsis.  Pharmacy to dose coumadin for afib.  Home dose coumadin 7.5 mg daily except 5 mg on Wednesday - last dose 6/9. 06/18/2017 INR elevated at 4.81 but down from 8.98. No coumadin 6/10 or 6/11.  PLTC low at 92 in pt with hx of pancytopenia.  Pt was on flagyl x 1 day 6/10 - flagyl has a major interaction with coumadin causing elevated INR. RN reported blood clot in foley 6/11 am. 6/12 urine reported to be pretty clear.    Goal of Therapy:  INR 2-3 Monitor platelets by anticoagulation protocol: Yes   Plan:  No coumadin today Daily INR  Eudelia Bunch, Pharm.D. [phone] 06/18/2017  1:01 PM

## 2017-06-18 NOTE — Progress Notes (Signed)
Patient had frequent episodes of wide complex tachycardia while resting throughout the night. Patient is asymptomatic and denies chest pain and denies shortness of breath. Hospitalists coverage paged to inform of events.

## 2017-06-18 NOTE — Progress Notes (Signed)
Initial Nutrition Assessment  DOCUMENTATION CODES:   Obesity unspecified  INTERVENTION:  - Will order Premier Protein BID, each supplement provides 160 kcal and 30 grams of protein.  - Will order daily multivitamin with minerals.  - Continue to encourage PO intakes.   NUTRITION DIAGNOSIS:   Inadequate oral intake related to acute illness, decreased appetite as evidenced by per patient/family report, meal completion < 50%.  GOAL:   Patient will meet greater than or equal to 90% of their needs  MONITOR:   PO intake, Supplement acceptance, Weight trends, Labs  REASON FOR ASSESSMENT:   Malnutrition Screening Tool  ASSESSMENT:   84 y.o. male past medical history significant for chronic atrial fibrillation, COPD, DM, non-Hodgkin's lymphoma s/p chemo and radiation recently discharged from the hospital for sepsis in the setting of UTI, in the setting of BPH. He had been started on Flomax and Urology recommended a Foley and to follow-up. On the date scheduled for follow-up, patient required hospital admission d/t inability to void with urinary retention and associated suprapubic pain.  Pt has mainly been eating 25-50% of meals since admission. He was recently admitted from the end of May to the beginning of June with similar intakes but intakes improved closer to d/c and while patient was at home. He reports no abdominal pain or N/V, but that appetite is decreased with generally feeling unwell. Appetite is usually good when he is feeling well.   Per chart review, pt has lost 6 lbs (2.6% body weight) in the past 1.5 months; this is not significant for time frame. He has lost 16 lbs (7% body weight) in the past 3 months; also not significant for time frame. Also noted that pt is requiring high rate IVF at this time.   Per Dr. Barbra Sarks note yesterday AM: sepsis 2/2 UTI with acute urinary retention with fluid resuscitation ordered, thrombocytopenia, poorly controlled Type 2 DM with  neuropathy.  Medications reviewed; sliding scale Novolog, 10 units Lantus/day, 1000 mcg oral vitamin B12/day.  Labs reviewed from 6/10; CBG: 114 mg/dL today, Na: 131 mmol/L, Cl: 98 mmol/L, creatinine: 1.38 mg/dL, Ca: 8.8 mg/dL, GFR: 46 mL/min.  IVF: NS @ 125 mL/hr.      NUTRITION - FOCUSED PHYSICAL EXAM:  Completed; no muscle and no fat wasting; mild edema to all extremities.   Diet Order:   Diet Order           Diet heart healthy/carb modified Room service appropriate? Yes; Fluid consistency: Thin  Diet effective now          EDUCATION NEEDS:   No education needs have been identified at this time  Skin:  Skin Assessment: Reviewed RN Assessment  Last BM:  6/11  Height:   Ht Readings from Last 1 Encounters:  06/17/17 5\' 10"  (1.778 m)    Weight:   Wt Readings from Last 1 Encounters:  06/17/17 223 lb 8.7 oz (101.4 kg)    Ideal Body Weight:  75.45 kg  BMI:  Body mass index is 32.08 kg/m.  Estimated Nutritional Needs:   Kcal:  1725-1930 (17-19 kcal/kg)  Protein:  80-100 grams (0.8-1 grams/kg)  Fluid:  >/= 1.8 L/day     Jarome Matin, MS, RD, LDN, [facility] Inpatient Clinical Dietitian Pager # [phone] After hours/weekend pager # [phone]

## 2017-06-18 NOTE — Progress Notes (Signed)
Pharmacy Antibiotic Note  Gary Yates. is a 84 y.o. male admitted on 06/16/2017 with sepsis.  Pharmacy has been consulted for cefepime and vancomycin dosing. 06/18/2017  ABX D#2.  Tmax 101.2, SCR 1.38>0.95. UCx NGF. BCx NGTD.   Plan: Cefepime 1 Gm IV q8h Change vancomycin to 1250 mg IV q24 for est AUC 428 w/ SCr 1 Goal AUC = 400-500 F/u scr/cultures/levels  Height: 5\' 10"  (177.8 cm) Weight: 223 lb 8.7 oz (101.4 kg) IBW/kg (Calculated) : 73  Temp (24hrs), Avg:99.5 F (37.5 C), Min:98.8 F (37.1 C), Max:101.2 F (38.4 C)  Recent Labs  Lab 06/12/17 0433 06/16/17 2200 06/16/17 2201 06/16/17 2337 06/18/17 0511  WBC 2.6*  --  4.4  --   --   CREATININE 0.92  --  1.38*  --  0.95  LATICACIDVEN  --  2.64*  --  2.00*  --     Estimated Creatinine Clearance: 71.6 mL/min (by C-G formula based on SCr of 0.95 mg/dL).    Allergies  Allergen Reactions  . Ace Inhibitors Other (See Comments)    cough  . Codeine Nausea Only and Rash       . Penicillins Rash    Childhood allergy Has patient had a PCN reaction causing immediate rash, facial/tongue/throat swelling, SOB or lightheadedness with hypotension: Yes Has patient had a PCN reaction causing severe rash involving mucus membranes or skin necrosis: Yes Has patient had a PCN reaction that required hospitalization No Has patient had a PCN reaction occurring within the last 10 years: No If all of the above answers are "NO", then may proceed with Cephalosporin use.    Antimicrobials this admission: 6/10 cefepime >>  6/10 vancomycin >>  6/3 - 6/6 vanc prev admit Dose adjustments this admission: 6/12 vanc 1 gm q24>>1250 q24 Microbiology results: 6/10 BCx2>>ngtd 6/10 Ucx>>NGF 6/11 c diff >> neg 6/11 GI panel >>neg 6/5 Ucx> NGF 5/31 Ucxa > 100 K CNS sens gent/nitrofurantion/rifampin/tetracycline, vanc  Thank you for allowing pharmacy to be a part of this patient's care. Eudelia Bunch, Pharm.D. [phone] 06/18/2017 1:33  PM

## 2017-06-18 NOTE — Progress Notes (Signed)
PROGRESS NOTE [facility]   [name]   KVQ:[id number] DOB: 1933/08/11  DOA: 06/16/2017 PCP: [name], MD   Brief Narrative:  Gary Salinger. is an 84 year old male with past medical history significant for chronic A. fib, on Coumadin, COPD, diabetes mellitus, non-Hodgkin's lymphoma who was recently discharged from the hospital due to UTI leading to sepsis in the setting of BPH,  At the time urology was consulted and recommended to keep Foley and follow-up voiding trials in the office.  Patient was seen at the urology office and Foley catheter was removed, however patient began to complain of suprapubic pain and no urine output.  Patient presented to the emergency department with abdominal pain and urinary retention.  Patient was admitted by the working diagnosis of recurrent urinary retention.  Subjective: Patient seen and examined, he feel much better.  Foley catheter in place.  Febrile episode last night.  Urine culture negative.  Tolerating diet well.  No other concerns  Assessment & Plan: Acute urinary retention in setting of BPH. ?  Sepsis unclear source, patient had UTI on prior admission which was treated.  Last urine culture had CONS she was treated with doxycycline.  Urine culture from 6/10 has no growth, will discontinue vancomycin.  Continue cefepime for now.  Will obtain CT abdomen and pelvis.  Patient's INR was elevated need to rule out hematoma.  Case discussed with neurology recommend to keep Foley for now and they will follow as an outpatient.  If CT abdomen pelvis shows acute process, they will see as inpatient.  Continue to monitor.  Paroxysmal atrial fibrillation Heart rate labile, he is on metoprolol, will continue to monitor for now INR supratherapeutic, hold Coumadin.  Continue to monitor  Type 2 diabetes mellitus uncontrolled with hyperglycemia CBGs stable Metformin on hold Continue SSI  Diffuse non-Hodgkin lymphomas of testes On  remission  Acute renal failure In setting of urinary retention Continue gentle hydration Creatinine improved  Thrombocytopenia Unclear etiology Continue to monitor  DVT prophylaxis: SCDs, INR supratherapeutic, Coumadin on hold Code Status: Full code Family Communication: Family at bedside Disposition Plan: Home in 1 to 2 days  Consultants:   Oncology  Procedures:   None  Antimicrobials: Anti-infectives (From admission, onward)   Start     Dose/Rate Route Frequency Ordered Stop   06/18/17 2200  vancomycin (VANCOCIN) 1,250 mg in sodium chloride 0.9 % 250 mL IVPB     1,250 mg 166.7 mL/hr over 90 Minutes Intravenous Every 24 hours 06/18/17 1318     06/17/17 2359  vancomycin (VANCOCIN) IVPB 1000 mg/200 mL premix  Status:  Discontinued     1,000 mg 200 mL/hr over 60 Minutes Intravenous Every 24 hours 06/17/17 0204 06/18/17 1318   06/17/17 0800  ceFEPIme (MAXIPIME) 1 g in sodium chloride 0.9 % 100 mL IVPB     1 g 200 mL/hr over 30 Minutes Intravenous Every 8 hours 06/17/17 0204     06/16/17 2330  vancomycin (VANCOCIN) 2,000 mg in sodium chloride 0.9 % 500 mL IVPB     2,000 mg 250 mL/hr over 120 Minutes Intravenous  Once 06/16/17 2315 06/17/17 0221   06/16/17 2330  ceFEPIme (MAXIPIME) 2 g in sodium chloride 0.9 % 100 mL IVPB     2 g 200 mL/hr over 30 Minutes Intravenous  Once 06/16/17 2318 06/17/17 0013       Objective: Vitals:   06/17/17 2130 06/18/17 0751 06/18/17 0945 06/18/17 1232  BP: 116/63  (!) 111/58 Marland Kitchen)  117/58  Pulse: (!) 105   92  Resp: 18  18 19   Temp: (!) 101.2 F (38.4 C) 98.8 F (37.1 C) 99.2 F (37.3 C) 98.9 F (37.2 C)  TempSrc: Oral Oral Oral   SpO2: 99%  97% 96%  Weight:      Height:        Intake/Output Summary (Last 24 hours) at 06/18/2017 1513 Last data filed at 06/18/2017 1230 Gross per 24 hour  Intake 1071.67 ml  Output 2401 ml  Net -1329.33 ml   Filed Weights   06/16/17 2028 06/17/17 0316  Weight: 102.5 kg (226 lb) 101.4 kg (223 lb  8.7 oz)    Examination:  General exam: Appears calm and comfortable  HEENT: OP moist and clear Respiratory system: Clear to auscultation. No wheezes,crackle or rhonchi Cardiovascular system: S1 & S2 heard, Irr Irr. No JVD, murmurs, rubs or gallops Gastrointestinal system: Abdomen is nondistended, soft and nontender. Genitourinary: Foley catheter in place, urine yellow Central nervous system: Alert and oriented. No focal neurological deficits. Extremities: Trace lower extremity edema Skin: No rashes Psychiatry: Mood & affect appropriate.    Data Reviewed: I have personally reviewed following labs and imaging studies  CBC: Recent Labs  Lab 06/12/17 0433 06/16/17 2201  WBC 2.6* 4.4  NEUTROABS 1.7 3.3  HGB 9.9* 12.6*  HCT 30.7* 37.6*  MCV 95.0 92.8  PLT 48* 92*   Basic Metabolic Panel: Recent Labs  Lab 06/12/17 0433 06/16/17 2201 06/18/17 0511  NA 142 131*  --   K 3.4* 3.7  --   CL 106 98*  --   CO2 29 23  --   GLUCOSE 126* 132*  --   BUN 14 19  --   CREATININE 0.92 1.38* 0.95  CALCIUM 8.2* 8.8*  --   MG 1.9  --   --    GFR: Estimated Creatinine Clearance: 71.6 mL/min (by C-G formula based on SCr of 0.95 mg/dL). Liver Function Tests: Recent Labs  Lab 06/16/17 2201  AST 25  ALT 11*  ALKPHOS 94  BILITOT 0.5  PROT 7.0  ALBUMIN 3.2*   Recent Labs  Lab 06/16/17 2201  LIPASE 39   No results for input(s): AMMONIA in the last 168 hours. Coagulation Profile: Recent Labs  Lab 06/12/17 0433 06/17/17 0909 06/18/17 0511  INR 2.46 8.98* 4.81*   Cardiac Enzymes: No results for input(s): CKTOTAL, CKMB, CKMBINDEX, TROPONINI in the last 168 hours. BNP (last 3 results) No results for input(s): PROBNP in the last 8760 hours. HbA1C: No results for input(s): HGBA1C in the last 72 hours. CBG: Recent Labs  Lab 06/17/17 0748 06/17/17 1202 06/17/17 1626 06/17/17 2151 06/18/17 1204  GLUCAP 128* 118* 122* 104* 114*   Lipid Profile: No results for input(s):  CHOL, HDL, LDLCALC, TRIG, CHOLHDL, LDLDIRECT in the last 72 hours. Thyroid Function Tests: No results for input(s): TSH, T4TOTAL, FREET4, T3FREE, THYROIDAB in the last 72 hours. Anemia Panel: No results for input(s): VITAMINB12, FOLATE, FERRITIN, TIBC, IRON, RETICCTPCT in the last 72 hours. Sepsis Labs: Recent Labs  Lab 06/16/17 2200 06/16/17 2337  LATICACIDVEN 2.64* 2.00*    Recent Results (from the past 240 hour(s))  Urine Culture     Status: None   Collection Time: 06/11/17  1:54 PM  Result Value Ref Range Status   Specimen Description   Final    URINE, CATHETERIZED Performed at [facility] [address], [place], [place] [address]    Special Requests   Final  Normal Performed at [facility], [place] [address] [place], [place] [address]    Culture   Final    NO GROWTH Performed at [facility], [place] [address], [place], [place] [address]    Report Status 06/12/2017 FINAL  Final  Urine culture     Status: None   Collection Time: 06/16/17  8:49 PM  Result Value Ref Range Status   Specimen Description   Final    URINE, CATHETERIZED Performed at [facility], [place] [address], [place], [place] [address]    Special Requests   Final    NONE Performed at [facility], [name] [address], [place], [place] [address]    Culture   Final    NO GROWTH Performed at [facility], [place] [address] [place], [place] [address]    Report Status 06/18/2017 FINAL  Final  Blood Culture (routine x 2)     Status: None (Preliminary result)   Collection Time: 06/16/17 10:01 PM  Result Value Ref Range Status   Specimen Description   Final    BLOOD LEFT ANTECUBITAL Performed at [facility] [address] [place], [place] [address]    Special Requests   Final    BOTTLES DRAWN AEROBIC AND ANAEROBIC Blood Culture results may not be optimal due to an excessive volume of blood received in  culture bottles Performed at [facility] [address], [place], [place] [address]    Culture   Final    NO GROWTH 2 DAYS Performed at [facility] [address] [place], [place] [address]    Report Status PENDING  Incomplete  Blood Culture (routine x 2)     Status: None (Preliminary result)   Collection Time: 06/16/17 10:02 PM  Result Value Ref Range Status   Specimen Description   Final    BLOOD RIGHT CHEST Performed at [facility] [address] [place], [place] [address]    Special Requests   Final    BOTTLES DRAWN AEROBIC AND ANAEROBIC Blood Culture adequate volume Performed at [facility] [address], [place], [place] [address]    Culture   Final    NO GROWTH 2 DAYS Performed at [facility] [address] [place], [place] [address]    Report Status PENDING  Incomplete  C difficile quick scan w PCR reflex     Status: None   Collection Time: 06/17/17 11:30 AM  Result Value Ref Range Status   C Diff antigen NEGATIVE NEGATIVE Final   C Diff toxin NEGATIVE NEGATIVE Final   C Diff interpretation No C. difficile detected.  Final    Comment: Performed at [facility], [place] [address], [place],  [address]  Gastrointestinal Panel by PCR , Stool     Status: None   Collection Time: 06/17/17 11:30 AM  Result Value Ref Range Status   Campylobacter species NOT DETECTED NOT DETECTED Final   Plesimonas shigelloides NOT DETECTED NOT DETECTED Final   Salmonella species NOT DETECTED NOT DETECTED Final   Yersinia enterocolitica NOT DETECTED NOT DETECTED Final   Vibrio species NOT DETECTED NOT DETECTED Final   Vibrio cholerae NOT DETECTED NOT DETECTED Final   Enteroaggregative E coli (EAEC) NOT DETECTED NOT DETECTED Final   Enteropathogenic E coli (EPEC) NOT DETECTED NOT DETECTED Final   Enterotoxigenic E coli (ETEC) NOT DETECTED NOT DETECTED Final   Shiga like  toxin producing E  coli (STEC) NOT DETECTED NOT DETECTED Final   Shigella/Enteroinvasive E coli (EIEC) NOT DETECTED NOT DETECTED Final   Cryptosporidium NOT DETECTED NOT DETECTED Final   Cyclospora cayetanensis NOT DETECTED NOT DETECTED Final   Entamoeba histolytica NOT DETECTED NOT DETECTED Final   Giardia lamblia NOT DETECTED NOT DETECTED Final   Adenovirus F40/41 NOT DETECTED NOT DETECTED Final   Astrovirus NOT DETECTED NOT DETECTED Final   Norovirus GI/GII NOT DETECTED NOT DETECTED Final   Rotavirus A NOT DETECTED NOT DETECTED Final   Sapovirus (I, II, IV, and V) NOT DETECTED NOT DETECTED Final    Comment: Performed at [facility], [address] [place], [place] [address]     Radiology Studies: No results found.   Scheduled Meds: . atorvastatin  40 mg Oral q1800  . darifenacin  7.5 mg Oral Daily  . gabapentin  600 mg Oral TID  . insulin aspart  0-9 Units Subcutaneous TID WC  . insulin glargine  10 Units Subcutaneous Daily  . multivitamin with minerals  1 tablet Oral Daily  . omega-3 acid ethyl esters  1 g Oral Daily  . pramipexole  3 mg Oral QHS  . protein supplement shake  11 oz Oral BID BM  . vitamin B-12  1,000 mcg Oral Daily  . Warfarin - Pharmacist Dosing Inpatient   Does not apply q1800   Continuous Infusions: . sodium chloride 1,000 mL (06/18/17 1413)  . ceFEPime (MAXIPIME) IV Stopped (06/18/17 0910)  . vancomycin       LOS: 1 day   Time spent: Total of 35 minutes spent with pt, greater than 50% of which was spent in discussion of  treatment, counseling and coordination of care  Gary Oman, MD Pager: Text Page via [email or web address]   If 7PM-7AM, please contact night-coverage [email or web address] 06/18/2017, 3:13 PM   Note - This record has been created using [employer]. Chart creation errors have been sought, but may not always have been located. Such creation errors do not reflect on the standard of medical care.

## 2017-06-18 NOTE — Progress Notes (Signed)
CRITICAL VALUE ALERT  Critical Value:  INR 4.81  Date & Time Notied:  06/18/2017 0730   Provider Notified: Dr. Patrecia Pour  Orders Received/Actions taken: no new orders received at this time. INR today 4.81, yesterday was 8.98

## 2017-06-18 NOTE — Progress Notes (Signed)
PT Cancellation Note  Patient Details Name: Gary Yates. MRN: [id number] DOB: March 30, 1933   Cancelled Treatment:    Reason Eval/Treat Not Completed: Attempted PT eval. Pt declined to work with PT at this time. He stated he wanted to have his CT scan first. Explained to pt that we will check back another day.    Weston Anna, MPT Pager: [phone]

## 2017-06-19 DIAGNOSIS — N390 Urinary tract infection, site not specified: Secondary | ICD-10-CM

## 2017-06-19 DIAGNOSIS — C8589 Other specified types of non-Hodgkin lymphoma, extranodal and solid organ sites: Secondary | ICD-10-CM

## 2017-06-19 LAB — PROTIME-INR
INR: 3.76
PROTHROMBIN TIME: 36.9 s — AB (ref 11.4–15.2)

## 2017-06-19 LAB — CBC WITH DIFFERENTIAL/PLATELET
BASOS PCT: 0 %
Basophils Absolute: 0 10*3/uL (ref 0.0–0.1)
Eosinophils Absolute: 0 10*3/uL (ref 0.0–0.7)
Eosinophils Relative: 1 %
HEMATOCRIT: 27.5 % — AB (ref 39.0–52.0)
HEMOGLOBIN: 9.2 g/dL — AB (ref 13.0–17.0)
LYMPHS PCT: 20 %
Lymphs Abs: 0.7 10*3/uL (ref 0.7–4.0)
MCH: 31.1 pg (ref 26.0–34.0)
MCHC: 33.5 g/dL (ref 30.0–36.0)
MCV: 92.9 fL (ref 78.0–100.0)
MONO ABS: 0.3 10*3/uL (ref 0.1–1.0)
MONOS PCT: 9 %
NEUTROS ABS: 2.4 10*3/uL (ref 1.7–7.7)
Neutrophils Relative %: 70 %
Platelets: 70 10*3/uL — ABNORMAL LOW (ref 150–400)
RBC: 2.96 MIL/uL — ABNORMAL LOW (ref 4.22–5.81)
RDW: 15.9 % — AB (ref 11.5–15.5)
WBC: 3.4 10*3/uL — ABNORMAL LOW (ref 4.0–10.5)

## 2017-06-19 LAB — GLUCOSE, CAPILLARY
GLUCOSE-CAPILLARY: 134 mg/dL — AB (ref 65–99)
GLUCOSE-CAPILLARY: 148 mg/dL — AB (ref 65–99)
Glucose-Capillary: 131 mg/dL — ABNORMAL HIGH (ref 65–99)
Glucose-Capillary: 157 mg/dL — ABNORMAL HIGH (ref 65–99)

## 2017-06-19 LAB — COMPREHENSIVE METABOLIC PANEL
ALBUMIN: 2 g/dL — AB (ref 3.5–5.0)
ALT: 8 U/L — ABNORMAL LOW (ref 17–63)
ANION GAP: 5 (ref 5–15)
AST: 15 U/L (ref 15–41)
Alkaline Phosphatase: 64 U/L (ref 38–126)
BUN: 11 mg/dL (ref 6–20)
CHLORIDE: 112 mmol/L — AB (ref 101–111)
CO2: 23 mmol/L (ref 22–32)
Calcium: 7.7 mg/dL — ABNORMAL LOW (ref 8.9–10.3)
Creatinine, Ser: 0.95 mg/dL (ref 0.61–1.24)
GFR calc non Af Amer: >60 mL/min (ref >60)
GLUCOSE: 135 mg/dL — AB (ref 65–99)
POTASSIUM: 2.9 mmol/L — AB (ref 3.5–5.1)
SODIUM: 140 mmol/L (ref 135–145)
Total Bilirubin: 0.4 mg/dL (ref 0.3–1.2)
Total Protein: 4.9 g/dL — ABNORMAL LOW (ref 6.5–8.1)

## 2017-06-19 LAB — MAGNESIUM: Magnesium: 1.3 mg/dL — ABNORMAL LOW (ref 1.7–2.4)

## 2017-06-19 MED ORDER — POTASSIUM CHLORIDE 10 MEQ/50ML IV SOLN
10.0000 meq | INTRAVENOUS | Status: AC
  Administered 2017-06-19 (×4): 10 meq via INTRAVENOUS
  Filled 2017-06-19 (×4): qty 50

## 2017-06-19 MED ORDER — POLYETHYLENE GLYCOL 3350 17 G PO PACK
17.0000 g | PACK | Freq: Every day | ORAL | Status: DC
  Administered 2017-06-19 – 2017-06-20 (×2): 17 g via ORAL
  Filled 2017-06-19 (×2): qty 1

## 2017-06-19 MED ORDER — MAGIC MOUTHWASH W/LIDOCAINE
5.0000 mL | ORAL | Status: DC
  Administered 2017-06-19 – 2017-06-20 (×4): 5 mL via ORAL
  Filled 2017-06-19 (×5): qty 5

## 2017-06-19 MED ORDER — MAGIC MOUTHWASH W/LIDOCAINE
5.0000 mL | Freq: Three times a day (TID) | ORAL | Status: DC
  Filled 2017-06-19 (×3): qty 5

## 2017-06-19 MED ORDER — MAGNESIUM SULFATE 2 GM/50ML IV SOLN
2.0000 g | Freq: Once | INTRAVENOUS | Status: AC
  Administered 2017-06-19: 2 g via INTRAVENOUS
  Filled 2017-06-19: qty 50

## 2017-06-19 NOTE — Evaluation (Signed)
Physical Therapy Evaluation Patient Details Name: Gary Yates. MRN: [id number] DOB: March 26, 1933 Today's Date: 06/19/2017   History of Present Illness  84 yo male admitted with sepsis 2* UTI. Hx of Afib, COPD, CAD, DM, NHL.   Clinical Impression  On eval, pt required Min assist for mobility. He walked ~65 feet with a RW. Pt presents with general weakness, decreased activity tolerance, and impaired gait and balance. He fatigues fairly easily with activity. Discussed d/c plan with pt and wife-plan is for return home with HHPT f/u. Pt hopes to eventually get strong enough to participate in OP PT again at some point. Will follow and progress activity as tolerated.     Follow Up Recommendations Home health PT    Equipment Recommendations  None recommended by PT    Recommendations for Other Services       Precautions / Restrictions Precautions Precautions: Fall Restrictions Weight Bearing Restrictions: No      Mobility  Bed Mobility Overal bed mobility: Needs Assistance Bed Mobility: Supine to Sit     Supine to sit: Min assist     General bed mobility comments: Wife gave pt assist to sit fully upright. Increased time. Pt also relied on bedrail   Transfers Overall transfer level: Needs assistance Equipment used: Rolling walker (2 wheeled) Transfers: Sit to/from Stand Sit to Stand: Min guard;From elevated surface         General transfer comment: close guard for safety. Vcs safety, hand placement.   Ambulation/Gait Ambulation/Gait assistance: Min guard Gait Distance (Feet): 65 Feet Assistive device: Rolling walker (2 wheeled) Gait Pattern/deviations: Step-through pattern;Decreased stride length     General Gait Details: close guard for safety. Pt fatigues fairly easily.   Stairs            Wheelchair Mobility    Modified Rankin (Stroke Patients Only)       Balance Overall balance assessment: Needs assistance           Standing balance-Leahy  Scale: Poor                               Pertinent Vitals/Pain Pain Assessment: No/denies pain    Home Living Family/patient expects to be discharged to:: Private residence Living Arrangements: Spouse/significant other   Type of Home: House Home Access: Stairs to enter Entrance Stairs-Rails: Right Entrance Stairs-Number of Steps: 2 Home Layout: Able to live on main level with bedroom/bathroom;Two level Home Equipment: Walker - 4 wheels;Shower seat;Grab bars - tub/shower      Prior Function Level of Independence: Independent with assistive device(s)   Gait / Transfers Assistance Needed: used rollator  ADL's / Homemaking Assistance Needed: dtrs and wife assisted with bathing/dressing, didn't cook, at out        [employer]        Extremity/Trunk Assessment   Upper Extremity Assessment Upper Extremity Assessment: Generalized weakness    Lower Extremity Assessment Lower Extremity Assessment: Generalized weakness    Cervical / Trunk Assessment Cervical / Trunk Assessment: Normal  Communication   Communication: No difficulties  Cognition Arousal/Alertness: Awake/alert Behavior During Therapy: WFL for tasks assessed/performed Overall Cognitive Status: Within Functional Limits for tasks assessed                                        General Comments  Exercises     Assessment/Plan    PT Assessment Patient needs continued PT services  PT Problem List Decreased strength;Decreased balance;Decreased mobility;Decreased activity tolerance;Decreased knowledge of use of DME       PT Treatment Interventions Gait training;Therapeutic activities;Stair training;Therapeutic exercise;Functional mobility training;DME instruction;Balance training;Patient/family education    PT Goals (Current goals can be found in the Care Plan section)  Acute Rehab PT Goals Patient Stated Goal: to go home. to regain strength and PLOF PT Goal  Formulation: With patient/family Time For Goal Achievement: 07/03/17 Potential to Achieve Goals: Good    Frequency Min 3X/week   Barriers to discharge        Co-evaluation               AM-PAC PT "6 Clicks" Daily Activity  Outcome Measure Difficulty turning over in bed (including adjusting bedclothes, sheets and blankets)?: A Little Difficulty moving from lying on back to sitting on the side of the bed? : A Lot Difficulty sitting down on and standing up from a chair with arms (e.g., wheelchair, bedside commode, etc,.)?: Unable Help needed moving to and from a bed to chair (including a wheelchair)?: A Little Help needed walking in hospital room?: A Little Help needed climbing 3-5 steps with a railing? : A Lot 6 Click Score: 14    End of Session Equipment Utilized During Treatment: Gait belt Activity Tolerance: Patient tolerated treatment well Patient left: in bed;with call bell/phone within reach;with family/visitor present   PT Visit Diagnosis: Muscle weakness (generalized) (M62.81);Difficulty in walking, not elsewhere classified (R26.2)    Time: 0737-1062 PT Time Calculation (min) (ACUTE ONLY): 30 min   Charges:   PT Evaluation $PT Eval Moderate Complexity: 1 Mod PT Treatments $Gait Training: 8-22 mins   PT G Codes:         Weston Anna, MPT Pager: [phone]

## 2017-06-19 NOTE — Progress Notes (Signed)
PROGRESS NOTE [facility]   Gary Yates.   ONG:[id number] DOB: 1933/04/24  DOA: 06/16/2017 PCP: [name], MD   Brief Narrative:  Gary Yates. is an 84 year old male with past medical history significant for chronic A. fib, on Coumadin, COPD, diabetes mellitus, non-Hodgkin's lymphoma who was recently discharged from the hospital due to UTI leading to sepsis in the setting of BPH,  At the time urology was consulted and recommended to keep Foley and follow-up voiding trials in the office.  Patient was seen at the urology office and Foley catheter was removed, however patient began to complain of suprapubic pain and no urine output.  Patient presented to the emergency department with abdominal pain and urinary retention.  Patient was admitted by the working diagnosis of recurrent urinary retention.  Subjective: Patient seen and examined with family at bedside, he continues to feel better.  Nursing staff reporting bloody stools, and pink urine in the Foley catheter.  Patient denies shortness of breath, chest pain and palpitation.  Report being straining in order to have a bowel movement.  Remains afebrile no acute events overnight..   Assessment & Plan: Acute urinary retention in setting of BPH. ?  Sepsis unclear source, patient had UTI on prior admission which was treated.  Last urine culture had CONS she was treated with doxycycline.  Urine culture from 6/10 has no growth, will discontinue vancomycin.  Been treated with cefepime, will switch to Keflex for completion purposes.  CT abdomen/pelvis reveal cystitis, prostatomegaly with mild hydronephrosis. Case discussed with neurology recommend to keep Foley for now and they will follow as an outpatient.  Pink urine in setting of supratherapeutic INR and inflammatory process of the bladder.  Will continue to monitor for now.   Paroxysmal atrial fibrillation Heart rate stable, continue metoprolol, INR remains supratherapeutic,  continue to hold Coumadin  Anemia of chronic disease Hemoglobin 9.9, initial hemoglobin was likely related to dehydration.  Historically hemoglobin around 9.5-10.5. Patient reports some rectal bleed, he does have multiple external and internal hemorrhoids in setting of supratherapeutic INR and patient straining for bowel movement, may have small hemorrhoidal bleed.  Will continue to monitor for now and hold Coumadin  Type 2 diabetes mellitus uncontrolled with hyperglycemia CBGs stable Metformin on hold Continue SSI  Diffuse non-Hodgkin lymphomas of testes On remission Oncology following   Acute renal failure - resolved In setting of urinary retention Hold IVF, monitor renal function    Thrombocytopenia Unclear etiology ? Medication, hospital related vs infectious process  Continue to monitor  DVT prophylaxis: SCDs, INR supratherapeutic, Coumadin on hold Code Status: Full code Family Communication: Family at bedside Disposition Plan: Home in AM if remains stable   Consultants:   Oncology  Procedures:   None  Antimicrobials: Anti-infectives (From admission, onward)   Start     Dose/Rate Route Frequency Ordered Stop   06/18/17 2200  vancomycin (VANCOCIN) 1,250 mg in sodium chloride 0.9 % 250 mL IVPB  Status:  Discontinued     1,250 mg 166.7 mL/hr over 90 Minutes Intravenous Every 24 hours 06/18/17 1318 06/18/17 1526   06/17/17 2359  vancomycin (VANCOCIN) IVPB 1000 mg/200 mL premix  Status:  Discontinued     1,000 mg 200 mL/hr over 60 Minutes Intravenous Every 24 hours 06/17/17 0204 06/18/17 1318   06/17/17 0800  ceFEPIme (MAXIPIME) 1 g in sodium chloride 0.9 % 100 mL IVPB     1 g 200 mL/hr over 30 Minutes Intravenous Every 8 hours 06/17/17  7902     06/16/17 2330  vancomycin (VANCOCIN) 2,000 mg in sodium chloride 0.9 % 500 mL IVPB     2,000 mg 250 mL/hr over 120 Minutes Intravenous  Once 06/16/17 2315 06/17/17 0221   06/16/17 2330  ceFEPIme (MAXIPIME) 2 g in sodium  chloride 0.9 % 100 mL IVPB     2 g 200 mL/hr over 30 Minutes Intravenous  Once 06/16/17 2318 06/17/17 0013      Objective: Vitals:   06/18/17 0945 06/18/17 1232 06/18/17 2041 06/19/17 0618  BP: (!) 111/58 (!) 117/58 105/71 124/62  Pulse:  92 (!) 103 87  Resp: 18 19 18 12   Temp: 99.2 F (37.3 C) 98.9 F (37.2 C) 99.7 F (37.6 C) 98.1 F (36.7 C)  TempSrc: Oral  Oral   SpO2: 97% 96% 98% 96%  Weight:      Height:        Intake/Output Summary (Last 24 hours) at 06/19/2017 1034 Last data filed at 06/18/2017 2048 Gross per 24 hour  Intake 223.75 ml  Output 1900 ml  Net -1676.25 ml   Filed Weights   06/16/17 2028 06/17/17 0316  Weight: 102.5 kg (226 lb) 101.4 kg (223 lb 8.7 oz)    Examination:  General: NAD  Cardiovascular: RRR, S1/S2 +, no rubs, no gallops Respiratory: BS diminished b/l  Abdominal: Soft, NT, ND, bowel sounds +, external multiple hemorrhoids, internal hemorrhoid at 6 and 9, glove with blood  Genitourinary: Foley cath in place, urine pink  Extremities: Trace LE edema, no cyanosis    Data Reviewed: I have personally reviewed following labs and imaging studies  CBC: Recent Labs  Lab 06/16/17 2201 06/19/17 0439  WBC 4.4 3.4*  NEUTROABS 3.3 2.4  HGB 12.6* 9.2*  HCT 37.6* 27.5*  MCV 92.8 92.9  PLT 92* 70*   Basic Metabolic Panel: Recent Labs  Lab 06/16/17 2201 06/18/17 0511 06/19/17 0439  NA 131*  --  140  K 3.7  --  2.9*  CL 98*  --  112*  CO2 23  --  23  GLUCOSE 132*  --  135*  BUN 19  --  11  CREATININE 1.38* 0.95 0.95  CALCIUM 8.8*  --  7.7*  MG  --   --  1.3*   GFR: Estimated Creatinine Clearance: 71.6 mL/min (by C-G formula based on SCr of 0.95 mg/dL). Liver Function Tests: Recent Labs  Lab 06/16/17 2201 06/19/17 0439  AST 25 15  ALT 11* 8*  ALKPHOS 94 64  BILITOT 0.5 0.4  PROT 7.0 4.9*  ALBUMIN 3.2* 2.0*   Recent Labs  Lab 06/16/17 2201  LIPASE 39   No results for input(s): AMMONIA in the last 168  hours. Coagulation Profile: Recent Labs  Lab 06/17/17 0909 06/18/17 0511 06/19/17 0439  INR 8.98* 4.81* 3.76   Cardiac Enzymes: No results for input(s): CKTOTAL, CKMB, CKMBINDEX, TROPONINI in the last 168 hours. BNP (last 3 results) No results for input(s): PROBNP in the last 8760 hours. HbA1C: No results for input(s): HGBA1C in the last 72 hours. CBG: Recent Labs  Lab 06/18/17 0744 06/18/17 1204 06/18/17 1655 06/18/17 2114 06/19/17 0748  GLUCAP 129* 114* 130* 141* 134*   Lipid Profile: No results for input(s): CHOL, HDL, LDLCALC, TRIG, CHOLHDL, LDLDIRECT in the last 72 hours. Thyroid Function Tests: No results for input(s): TSH, T4TOTAL, FREET4, T3FREE, THYROIDAB in the last 72 hours. Anemia Panel: No results for input(s): VITAMINB12, FOLATE, FERRITIN, TIBC, IRON, RETICCTPCT in the last 72 hours.  Sepsis Labs: Recent Labs  Lab 06/16/17 2200 06/16/17 2337  LATICACIDVEN 2.64* 2.00*    Recent Results (from the past 240 hour(s))  Urine Culture     Status: None   Collection Time: 06/11/17  1:54 PM  Result Value Ref Range Status   Specimen Description   Final    URINE, CATHETERIZED Performed at [facility] [address], [place], [place] [address]    Special Requests   Final    Normal Performed at [facility], [place] [address] [place], [place] [address]    Culture   Final    NO GROWTH Performed at [facility], [place] [address], [place], [place] [address]    Report Status 06/12/2017 FINAL  Final  Urine culture     Status: None   Collection Time: 06/16/17  8:49 PM  Result Value Ref Range Status   Specimen Description   Final    URINE, CATHETERIZED Performed at [facility], [place] [address] [place], [place] [address]    Special Requests   Final    NONE Performed at [facility], [place] [address] [place], [place] [address]    Culture   Final    NO GROWTH Performed at [facility], [place] [address] [place], [place] [address]    Report Status 06/18/2017 FINAL  Final  Blood Culture (routine x 2)     Status: None (Preliminary result)   Collection Time: 06/16/17 10:01 PM  Result Value Ref Range Status   Specimen Description   Final    BLOOD LEFT ANTECUBITAL Performed at [facility] [address], [place], [place] [address]    Special Requests   Final    BOTTLES DRAWN AEROBIC AND ANAEROBIC Blood Culture results may not be optimal due to an excessive volume of blood received in culture bottles Performed at [facility] [address] [place], [place] [address]    Culture   Final    NO GROWTH 2 DAYS Performed at [facility] [address], [place], [place] [address]    Report Status PENDING  Incomplete  Blood Culture (routine x 2)     Status: None (Preliminary result)   Collection Time: 06/16/17 10:02 PM  Result Value Ref Range Status   Specimen Description   Final    BLOOD RIGHT CHEST Performed at [facility] [address], [place], [place] [address]    Special Requests   Final    BOTTLES DRAWN AEROBIC AND ANAEROBIC Blood Culture adequate volume Performed at [facility] [address] [place], [place] [address]    Culture   Final    NO GROWTH 2 DAYS Performed at [facility] [address], [place], [place] [address]    Report Status PENDING  Incomplete  C difficile quick scan w PCR reflex     Status: None   Collection Time: 06/17/17 11:30 AM  Result Value Ref Range Status   C Diff antigen NEGATIVE NEGATIVE Final   C Diff toxin NEGATIVE NEGATIVE Final   C Diff interpretation No C. difficile detected.  Final    Comment: Performed at [facility], [place] [address], [place],  [address]  Gastrointestinal Panel by PCR , Stool     Status: None   Collection Time: 06/17/17 11:30 AM  Result Value Ref Range Status    Campylobacter species NOT DETECTED NOT  DETECTED Final   Plesimonas shigelloides NOT DETECTED NOT DETECTED Final   Salmonella species NOT DETECTED NOT DETECTED Final   Yersinia enterocolitica NOT DETECTED NOT DETECTED Final   Vibrio species NOT DETECTED NOT DETECTED Final   Vibrio cholerae NOT DETECTED NOT DETECTED Final   Enteroaggregative E coli (EAEC) NOT DETECTED NOT DETECTED Final   Enteropathogenic E coli (EPEC) NOT DETECTED NOT DETECTED Final   Enterotoxigenic E coli (ETEC) NOT DETECTED NOT DETECTED Final   Shiga like toxin producing E coli (STEC) NOT DETECTED NOT DETECTED Final   Shigella/Enteroinvasive E coli (EIEC) NOT DETECTED NOT DETECTED Final   Cryptosporidium NOT DETECTED NOT DETECTED Final   Cyclospora cayetanensis NOT DETECTED NOT DETECTED Final   Entamoeba histolytica NOT DETECTED NOT DETECTED Final   Giardia lamblia NOT DETECTED NOT DETECTED Final   Adenovirus F40/41 NOT DETECTED NOT DETECTED Final   Astrovirus NOT DETECTED NOT DETECTED Final   Norovirus GI/GII NOT DETECTED NOT DETECTED Final   Rotavirus A NOT DETECTED NOT DETECTED Final   Sapovirus (I, II, IV, and V) NOT DETECTED NOT DETECTED Final    Comment: Performed at [facility], [address] [place], [place] [address]     Radiology Studies: Ct Abdomen Pelvis W Contrast  Result Date: 06/18/2017 CLINICAL DATA:  Urinary retention, enlarged prostate, abdominal pain, diarrhea EXAM: CT ABDOMEN AND PELVIS WITH CONTRAST TECHNIQUE: Multidetector CT imaging of the abdomen and pelvis was performed using the standard protocol following bolus administration of intravenous contrast. CONTRAST:  155mL ISOVUE-300 IOPAMIDOL (ISOVUE-300) INJECTION 61% COMPARISON:  PET-CT dated 04/28/2017 FINDINGS: Lower chest: Mild scarring/atelectasis in the lingula and left lower lobe. Hepatobiliary: Liver is within normal limits. Status post cholecystectomy. No intrahepatic or extrahepatic ductal dilatation. Pancreas: Within normal  limits. Spleen: Normal in size. Adrenals/Urinary Tract: Stable mild thickening of the left adrenal gland (series 2/image 23). Right adrenal gland is within normal limits. Bilateral renal cysts, measuring up to 6.1 cm in the anterior left lower kidney. Mild bilateral hydronephrosis. Mild urothelial enhancement involving the distal ureters (series 2/image 66). Irregular thick-walled bladder. Indwelling Foley catheter with nondependent gas. Perivesical stranding (series 2/image 70). Stomach/Bowel: Stomach is within normal limits. No evidence of bowel obstruction. Appendix is not discretely visualized. Vascular/Lymphatic: No evidence of abdominal aortic aneurysm. Atherosclerotic calcifications of the abdominal aorta and branch vessels. No suspicious abdominopelvic lymphadenopathy. Reproductive: Prostatomegaly, with mild enlargement of the central gland. Other: Small volume pelvic mesenteric stranding/fluid (series 2/image 64) with presacral fluid (series 2/image 71). Musculoskeletal: Mild degenerative changes of the visualized thoracolumbar spine. IMPRESSION: Irregular thick-walled bladder with perivesical stranding, suggesting cystitis. Associated mild urothelial enhancement involving the distal ureters, likely reflecting mild ascending infection. Indwelling Foley catheter. Mild bilateral hydronephrosis. Given associated prostatomegaly, this may reflect chronic bladder outlet obstruction. Additional stable ancillary findings as above. Electronically Signed   By: Julian Hy M.D.   On: 06/18/2017 16:43     Scheduled Meds: . atorvastatin  40 mg Oral q1800  . darifenacin  7.5 mg Oral Daily  . gabapentin  600 mg Oral TID  . insulin aspart  0-9 Units Subcutaneous TID WC  . insulin glargine  10 Units Subcutaneous Daily  . magic mouthwash w/lidocaine  5 mL Oral TID  . multivitamin with minerals  1 tablet Oral Daily  . omega-3 acid ethyl esters  1 g Oral Daily  . pramipexole  3 mg Oral QHS  . protein  supplement shake  11 oz Oral BID BM  . vitamin B-12  1,000 mcg  Oral Daily  . Warfarin - Pharmacist Dosing Inpatient   Does not apply q1800   Continuous Infusions: . sodium chloride 75 mL/hr at 06/18/17 1600  . ceFEPime (MAXIPIME) IV Stopped (06/19/17 1004)  . magnesium sulfate 1 - 4 g bolus IVPB    . potassium chloride Stopped (06/19/17 0914)     LOS: 2 days   Time spent: Total of 25 minutes spent with pt, greater than 50% of which was spent in discussion of  treatment, counseling and coordination of care  Chipper Oman, MD Pager: Text Page via [email or web address]   If 7PM-7AM, please contact night-coverage [email or web address] 06/19/2017, 10:34 AM   Note - This record has been created using [employer]. Chart creation errors have been sought, but may not always have been located. Such creation errors do not reflect on the standard of medical care.

## 2017-06-19 NOTE — Progress Notes (Signed)
[place] for Warfarin Indication: atrial fibrillation  Allergies  Allergen Reactions  . Ace Inhibitors Other (See Comments)    cough  . Codeine Nausea Only and Rash       . Penicillins Rash    Childhood allergy Has patient had a PCN reaction causing immediate rash, facial/tongue/throat swelling, SOB or lightheadedness with hypotension: Yes Has patient had a PCN reaction causing severe rash involving mucus membranes or skin necrosis: Yes Has patient had a PCN reaction that required hospitalization No Has patient had a PCN reaction occurring within the last 10 years: No If all of the above answers are "NO", then may proceed with Cephalosporin use.     Patient Measurements: Height: 5\' 10"  (177.8 cm) Weight: 223 lb 8.7 oz (101.4 kg) IBW/kg (Calculated) : 73   Vital Signs: Temp: 98.7 F (37.1 C) (06/13 1329) BP: 128/66 (06/13 1329) Pulse Rate: 85 (06/13 1329)  Labs: Recent Labs    06/16/17 2201 06/17/17 0909 06/18/17 0511 06/19/17 0439  HGB 12.6*  --   --  9.2*  HCT 37.6*  --   --  27.5*  PLT 92*  --   --  70*  LABPROT  --  72.8* 44.7* 36.9*  INR  --  8.98* 4.81* 3.76  CREATININE 1.38*  --  0.95 0.95    Estimated Creatinine Clearance: 71.6 mL/min (by C-G formula based on SCr of 0.95 mg/dL).   Assessment: 84 yo M on coumadin PTA for afib.  Admitted with urosepsis.  Pharmacy to dose coumadin for afib.  Home dose coumadin 7.5 mg daily except 5 mg on Wednesday - last dose 6/9. INR was elevated on admission, possibly due to drug-drug interaction with doxycycline and metronidazole PTA.  06/19/2017 INR elevated at 3.8 but trending down. No coumadin 6/10 - 6/12.  PLTC low at 70 in pt with hx of pancytopenia (Plt count in 50's during previous admission).   Pt was on flagyl x 1 day 6/10 - flagyl has a major interaction with coumadin causing elevated INR. RN reported blood clot in foley 6/11 am. 6/13 per discussion with RN - pt doing ok, a  tinge of blood in urine.    Goal of Therapy:  INR 2-3 Monitor platelets by anticoagulation protocol: Yes   Plan:  No coumadin today - continue to hold Daily INR  Theodis Shove, PharmD, BCPS Clinical Pharmacist Pager: [phone] 06/19/17 1:18 PM  Addendum: Per RN, urine is pink. Continue to hold warfarin  Lenis Noon, PharmD 06/19/17 1:34 PM

## 2017-06-19 NOTE — Progress Notes (Signed)
[facility] for Warfarin Indication: atrial fibrillation  Allergies  Allergen Reactions  . Ace Inhibitors Other (See Comments)    cough  . Codeine Nausea Only and Rash       . Penicillins Rash    Childhood allergy Has patient had a PCN reaction causing immediate rash, facial/tongue/throat swelling, SOB or lightheadedness with hypotension: Yes Has patient had a PCN reaction causing severe rash involving mucus membranes or skin necrosis: Yes Has patient had a PCN reaction that required hospitalization No Has patient had a PCN reaction occurring within the last 10 years: No If all of the above answers are "NO", then may proceed with Cephalosporin use.     Patient Measurements: Height: 5\' 10"  (177.8 cm) Weight: 223 lb 8.7 oz (101.4 kg) IBW/kg (Calculated) : 73   Vital Signs: Temp: 98.1 F (36.7 C) (06/13 0618) BP: 124/62 (06/13 0618) Pulse Rate: 87 (06/13 0618)  Labs: Recent Labs    06/16/17 2201 06/17/17 0909 06/18/17 0511 06/19/17 0439  HGB 12.6*  --   --  9.2*  HCT 37.6*  --   --  27.5*  PLT 92*  --   --  70*  LABPROT  --  72.8* 44.7* 36.9*  INR  --  8.98* 4.81* 3.76  CREATININE 1.38*  --  0.95 0.95    Estimated Creatinine Clearance: 71.6 mL/min (by C-G formula based on SCr of 0.95 mg/dL).   Assessment: 84 yo M on coumadin PTA for afib.  Admitted with urosepsis.  Pharmacy to dose coumadin for afib.  Home dose coumadin 7.5 mg daily except 5 mg on Wednesday - last dose 6/9. INR was elevated on admission, possibly due to drug-drug interaction with doxycycline and metronidazole PTA.  06/19/2017 INR elevated at 3.8 but trending down. No coumadin 6/10 - 6/12.  PLTC low at 70 in pt with hx of pancytopenia (Plt count in 50's during previous admission).   Pt was on flagyl x 1 day 6/10 - flagyl has a major interaction with coumadin causing elevated INR. RN reported blood clot in foley 6/11 am. 6/13 per discussion with RN - pt doing ok, a  tinge of blood in urine.    Goal of Therapy:  INR 2-3 Monitor platelets by anticoagulation protocol: Yes   Plan:  No coumadin today - continue to hold Daily INR  Theodis Shove, PharmD, BCPS Clinical Pharmacist Pager: [phone] 06/19/17 1:18 PM

## 2017-06-19 NOTE — Progress Notes (Signed)
Gary Yates had a pretty decent evening.  He had his CT scan done yesterday.  There is no evidence of recurrent lymphoma.  He does have an enlarged prostate with bilateral hydronephrosis.  He has some chronic bladder changes which might suggest some chronic obstruction.  His cultures are all negative.  His labs showed potassium to be 2.9.  His creatinine is 0.95.  His white cell count is 3.4 hemoglobin 9.2 platelet count 70,000.  I suspect that his blood counts are probably dropping because of being in the hospital, antibiotics, and may be underlying infection.  Again, there is nothing to suggest lymphoma as a problem.  Findings to have some potassium supplementation today.  I personally believe that the problem lies with the prostate.  I think that we need to see urology can help out with this.  I think he has a Foley catheter in.  He may need to keep the Foley catheter in for right now.  His vital signs are stable.  His temperature is 98.1.  Pulse 87.  Blood pressure 124/62.  Hopefully, physical therapy will be able to work with him today.  Apparently, they came around yesterday when he was going down to CT scan.  I know that the staff on [address]. we will do a great job in caring for him.  Lattie Haw, MD  [name] 1:9

## 2017-06-20 ENCOUNTER — Inpatient Hospital Stay: Payer: Medicare Other | Admitting: Family Medicine

## 2017-06-20 LAB — BASIC METABOLIC PANEL
ANION GAP: 4 — AB (ref 5–15)
BUN: 11 mg/dL (ref 6–20)
CALCIUM: 8 mg/dL — AB (ref 8.9–10.3)
CO2: 24 mmol/L (ref 22–32)
CREATININE: 0.88 mg/dL (ref 0.61–1.24)
Chloride: 109 mmol/L (ref 101–111)
GLUCOSE: 141 mg/dL — AB (ref 65–99)
Potassium: 3 mmol/L — ABNORMAL LOW (ref 3.5–5.1)
Sodium: 137 mmol/L (ref 135–145)

## 2017-06-20 LAB — CBC WITH DIFFERENTIAL/PLATELET
BASOS ABS: 0 10*3/uL (ref 0.0–0.1)
Basophils Relative: 0 %
EOS ABS: 0.1 10*3/uL (ref 0.0–0.7)
Eosinophils Relative: 3 %
HEMATOCRIT: 27.5 % — AB (ref 39.0–52.0)
Hemoglobin: 9.1 g/dL — ABNORMAL LOW (ref 13.0–17.0)
Lymphocytes Relative: 22 %
Lymphs Abs: 0.5 10*3/uL — ABNORMAL LOW (ref 0.7–4.0)
MCH: 30.8 pg (ref 26.0–34.0)
MCHC: 33.1 g/dL (ref 30.0–36.0)
MCV: 93.2 fL (ref 78.0–100.0)
MONO ABS: 0.3 10*3/uL (ref 0.1–1.0)
MONOS PCT: 12 %
NEUTROS ABS: 1.4 10*3/uL — AB (ref 1.7–7.7)
NEUTROS PCT: 63 %
Platelets: 71 10*3/uL — ABNORMAL LOW (ref 150–400)
RBC: 2.95 MIL/uL — ABNORMAL LOW (ref 4.22–5.81)
RDW: 15.6 % — AB (ref 11.5–15.5)
WBC: 2.2 10*3/uL — ABNORMAL LOW (ref 4.0–10.5)

## 2017-06-20 LAB — PROTIME-INR
INR: 2.11
PROTHROMBIN TIME: 23.4 s — AB (ref 11.4–15.2)

## 2017-06-20 LAB — MAGNESIUM: Magnesium: 1.6 mg/dL — ABNORMAL LOW (ref 1.7–2.4)

## 2017-06-20 LAB — GLUCOSE, CAPILLARY
GLUCOSE-CAPILLARY: 119 mg/dL — AB (ref 65–99)
GLUCOSE-CAPILLARY: 145 mg/dL — AB (ref 65–99)

## 2017-06-20 MED ORDER — DARIFENACIN HYDROBROMIDE ER 7.5 MG PO TB24: 7.5000 mg | ORAL_TABLET | Freq: Every day | ORAL | 0 refills | Status: DC

## 2017-06-20 MED ORDER — CEPHALEXIN 500 MG PO CAPS: 500.0000 mg | ORAL_CAPSULE | Freq: Two times a day (BID) | ORAL | 0 refills | Status: AC

## 2017-06-20 MED ORDER — POTASSIUM CHLORIDE 10 MEQ/100ML IV SOLN
10.0000 meq | INTRAVENOUS | Status: AC
  Administered 2017-06-20 (×3): 10 meq via INTRAVENOUS
  Filled 2017-06-20 (×3): qty 100

## 2017-06-20 MED ORDER — CEPHALEXIN 500 MG PO CAPS
500.0000 mg | ORAL_CAPSULE | Freq: Two times a day (BID) | ORAL | Status: DC
  Administered 2017-06-20: 500 mg via ORAL
  Filled 2017-06-20: qty 1

## 2017-06-20 MED ORDER — HEPARIN SOD (PORK) LOCK FLUSH 100 UNIT/ML IV SOLN
500.0000 [IU] | INTRAVENOUS | Status: AC | PRN
  Administered 2017-06-20: 500 [IU]

## 2017-06-20 MED ORDER — POTASSIUM CHLORIDE CRYS ER 20 MEQ PO TBCR: 40.0000 meq | EXTENDED_RELEASE_TABLET | Freq: Two times a day (BID) | ORAL | Status: DC

## 2017-06-20 MED ORDER — MAGNESIUM SULFATE 2 GM/50ML IV SOLN
2.0000 g | Freq: Once | INTRAVENOUS | Status: AC
  Administered 2017-06-20: 2 g via INTRAVENOUS
  Filled 2017-06-20: qty 50

## 2017-06-20 MED ORDER — WARFARIN SODIUM 5 MG PO TABS: 7.5000 mg | ORAL_TABLET | Freq: Once | ORAL | Status: DC

## 2017-06-20 MED ORDER — POLYETHYLENE GLYCOL 3350 17 G PO PACK: 17.0000 g | PACK | Freq: Every day | ORAL | 0 refills | Status: DC

## 2017-06-20 MED ORDER — VITAMIN B-12 1000 MCG PO TABS: 1000.0000 ug | ORAL_TABLET | Freq: Every day | ORAL | 0 refills | Status: DC

## 2017-06-20 MED ORDER — TBO-FILGRASTIM 480 MCG/0.8ML ~~LOC~~ SOSY
480.0000 ug | PREFILLED_SYRINGE | Freq: Once | SUBCUTANEOUS | Status: AC
  Administered 2017-06-20: 480 ug via SUBCUTANEOUS
  Filled 2017-06-20: qty 0.8

## 2017-06-20 NOTE — Care Management Important Message (Signed)
Important Message  Patient Details  Name: Nyheem Binette. MRN: [id number] Date of Birth: 1933-08-04   Medicare Important Message Given:  Yes    Kerin Salen 06/20/2017, 11:38 AMImportant Message  Patient Details  Name: Nabeel Gladson. MRN: [id number] Date of Birth: December 24, 1933   Medicare Important Message Given:  Yes    Kerin Salen 06/20/2017, 11:38 AM

## 2017-06-20 NOTE — Progress Notes (Signed)
Pt with 15 bt of vtach on the monitor. Pt asleep with no complaints of CP, N/V, palpitations, light headedness. Vitals stable as follows  Vitals:   06/19/17 2259 06/20/17 0311  BP: 116/67 119/61  Pulse: 88 86  Resp: 20 18  Temp: 98.4 F (36.9 C) 97.8 F (36.6 C)  SpO2: 97% 95%   NP on call notified and awaiting orders. Will continue to monitor pt.

## 2017-06-20 NOTE — Progress Notes (Signed)
Gary Yates is doing okay.  He is about the same.  It sounds like he will need to have his Foley catheter in for a while.  His lab work shows that his white cell count is dropping slowly.  I do suspect that this is medication related.  I would stop his Maxipime.  All cultures are negative.  I will go ahead and give him a dose of Neupogen.  I think this will help.  His hemoglobin is 9.1.  Platelet count 71,000.  His appetite is doing okay.  He has had no nausea or vomiting.  It sounds like he might be going home today or tomorrow the latest.  There is been no fever.  He did have some physical therapy yesterday.  He did walk about 65 feet.  He has had no bleeding.  He has had no nausea or vomiting.  He has had no diarrhea.  From my perspective, I think the only problem is his white cell count going down.  Again I have to believe that this is iatrogenic.  I would stop his Maxipime.  Again he has no obvious infection by cultures.  Hopefully, he will see a urologist.  I am surprised that he has not been able to see one while in the hospital.  I am not sure what medication he can be put on to help try to shrink his prostate.  Lattie Haw, MD  [place] 12:12

## 2017-06-20 NOTE — Progress Notes (Signed)
[place] [facility] for Warfarin Indication: atrial fibrillation  Allergies  Allergen Reactions  . Ace Inhibitors Other (See Comments)    cough  . Codeine Nausea Only and Rash       . Penicillins Rash    Childhood allergy Has patient had a PCN reaction causing immediate rash, facial/tongue/throat swelling, SOB or lightheadedness with hypotension: Yes Has patient had a PCN reaction causing severe rash involving mucus membranes or skin necrosis: Yes Has patient had a PCN reaction that required hospitalization No Has patient had a PCN reaction occurring within the last 10 years: No If all of the above answers are "NO", then may proceed with Cephalosporin use.     Patient Measurements: Height: 5\' 10"  (177.8 cm) Weight: 223 lb 8.7 oz (101.4 kg) IBW/kg (Calculated) : 73   Vital Signs: Temp: 98.2 F (36.8 C) (06/14 0619) Temp Source: Oral (06/14 0619) BP: 115/63 (06/14 0619) Pulse Rate: 81 (06/14 0619)  Labs: Recent Labs    06/18/17 0511 06/19/17 0439 06/20/17 0517  HGB  --  9.2* 9.1*  HCT  --  27.5* 27.5*  PLT  --  70* 71*  LABPROT 44.7* 36.9* 23.4*  INR 4.81* 3.76 2.11  CREATININE 0.95 0.95 0.88    Estimated Creatinine Clearance: 77.3 mL/min (by C-G formula based on SCr of 0.88 mg/dL).   Assessment: 84 yo M on coumadin PTA for afib.  Admitted with urosepsis.  Pharmacy to dose coumadin for afib.  Home dose coumadin 7.5 mg daily except 5 mg on Wednesday - last dose 6/9. INR was elevated on admission, possibly due to drug-drug interaction with doxycycline and metronidazole PTA.  06/20/2017 INR down to 2.11 - therapeutic after coumadin held 6/10 - 6/13.  PLTC low at 71 in pt with hx of pancytopenia (Plt count in 50's during previous admission).  Hg 9.1 stable.  Pt was on flagyl x 1 day 6/10 - flagyl has a major interaction with coumadin causing elevated INR. RN reported blood clot in foley 6/11 am.  RN reported bloody stools and pink urine in  Foley catheter 6/13 - inflammatory process in bladder; straining for BM per pt.  6/14 RN reports urine is clear today.  Goal of Therapy:  INR 2-3 Monitor platelets by anticoagulation protocol: Yes   Plan:  Coumadin 7.5 mg po x 1 dose today Daily INR  Eudelia Bunch, Pharm.D. [phone] 06/20/2017 10:29 AM

## 2017-06-20 NOTE — Discharge Summary (Signed)
Physician Discharge Summary  Gary Yates.  TTS:[id number]  DOB: 11/27/1933  DOA: 06/16/2017 PCP: [name], MD  Admit date: 06/16/2017 Discharge date: 06/20/2017  Admitted From: Home  Disposition:  Home   Recommendations for Outpatient Follow-up:  1. Follow up with PCP in 1 week  2. Follow-up with urology in 1 week 3. Please obtain BMP/CBC in one week function, electrolytes and hemoglobin 4. Follow-up with oncology 5. Check INR in 3 to 5 days  Home Health: PT/OT/Aide  Equipment/Devices: Foley catheter  Discharge Condition: Stable  CODE STATUS: Full Code  Diet recommendation: Heart Healthy / Carb Modified   Brief/Interim Summary: For full details see H&P/Progress note, but in brief, Gary Yates. is a 84 year old male with past medical history significant for chronic A. fib, on Coumadin, COPD, diabetes mellitus, non-Hodgkin's lymphoma who was recently discharged from the hospital due to UTI leading to sepsis in the setting of BPH,  At the time urology was consulted and recommended to keep Foley and follow-up voiding trials in the office.  Patient was seen at the urology office and Foley catheter was removed, however patient began to complain of suprapubic pain and no urine output.  Patient presented to the emergency department with abdominal pain and urinary retention.  Patient was admitted by the working diagnosis of recurrent urinary retention.  Subjective: Patient seen and examined, he continues to do well.  Urine is clear.  No further bloody stools.  Remains afebrile.  Denies chest pain, shortness of breath and palpitation.  Discharge Diagnoses/Hospital Course:  Acute urinary retention in setting of BPH. Unable to determine if sepsis with unclear source, patient had UTI on prior admission which was treated. Last urine culture had CONS he was treated with doxycycline.  Urine culture from 6/10 has no growth, will discontinue vancomycin.  was treated with cefepime,  switched to Keflex for completion purposes.  CT abdomen/pelvis reveal cystitis, prostatomegaly with mild hydronephrosis. Case discussed with urology recommend to keep Foley in place and they will follow as an outpatient.  Pink urine in setting of supratherapeutic INR and inflammatory process of the bladder, which has resolved. Continue Flomax and Enablex.  Patient has urology appointment scheduled for 6/21  Paroxysmal atrial fibrillation Heart rate stable, continue metoprolol Upon admission INR was found to be supratherapeutic, likely due to infectious process and interaction with Flagyl.  Coumadin was held for 4 days and now INR is at therapeutic levels.  Will resume home dose Coumadin and check INR in 3 to 5 days.  Anemia of chronic disease Hemoglobin 9.9, initial hemoglobin was likely related to dehydration.  Historically hemoglobin around 9.5-10.5. Patient reports some rectal bleed, he does have multiple external and internal hemorrhoids in setting of supratherapeutic INR and patient straining for bowel movement, may have small hemorrhoidal bleed.  Hemoglobin remains stable and at baseline.  Check CBC in 1 week  Type 2 diabetes mellitus uncontrolled with hyperglycemia CBGs stable during hospital stay Resume home medications with no changes  Diffuse non-Hodgkin lymphomas of testes On remission Oncology following   Acute renal failure - resolved In setting of urinary retention Monitor renal function in 1 week  Thrombocytopenia Chronic, last admission platelets of 50 K Follow-up with oncology  Hypokalemia/hypomagnesemia Repleted Continue K supplement 20 mEq daily Check electrolytes in 1 week  All other chronic medical condition were stable during the hospitalization.  Patient was seen by physical therapy, recommending home health PT On the day of the discharge the patient's vitals were  stable, and no other acute medical condition were reported by patient. the patient was felt  safe to be discharge to home.   Discharge Instructions  You were cared for by a hospitalist during your hospital stay. If you have any questions about your discharge medications or the care you received while you were in the hospital after you are discharged, you can call the unit and asked to speak with the hospitalist on call if the hospitalist that took care of you is not available. Once you are discharged, your primary care physician will handle any further medical issues. Please note that NO REFILLS for any discharge medications will be authorized once you are discharged, as it is imperative that you return to your primary care physician (or establish a relationship with a primary care physician if you do not have one) for your aftercare needs so that they can reassess your need for medications and monitor your lab values.  Discharge Instructions    Call MD for:  difficulty breathing, headache or visual disturbances   Complete by:  As directed    Call MD for:  extreme fatigue   Complete by:  As directed    Call MD for:  hives   Complete by:  As directed    Call MD for:  persistant dizziness or light-headedness   Complete by:  As directed    Call MD for:  persistant nausea and vomiting   Complete by:  As directed    Call MD for:  redness, tenderness, or signs of infection (pain, swelling, redness, odor or green/yellow discharge around incision site)   Complete by:  As directed    Call MD for:  severe uncontrolled pain   Complete by:  As directed    Call MD for:  temperature >100.4   Complete by:  As directed    Diet - low sodium heart healthy   Complete by:  As directed    Increase activity slowly   Complete by:  As directed      Allergies as of 06/20/2017      Reactions   Ace Inhibitors Other (See Comments)   cough   Codeine Nausea Only, Rash      Penicillins Rash   Childhood allergy Has patient had a PCN reaction causing immediate rash, facial/tongue/throat swelling, SOB or  lightheadedness with hypotension: Yes Has patient had a PCN reaction causing severe rash involving mucus membranes or skin necrosis: Yes Has patient had a PCN reaction that required hospitalization No Has patient had a PCN reaction occurring within the last 10 years: No If all of the above answers are "NO", then may proceed with Cephalosporin use.      Medication List    STOP taking these medications   metroNIDAZOLE 500 MG tablet Commonly known as:  FLAGYL   polyethylene glycol powder powder Commonly known as:  MIRALAX Replaced by:  polyethylene glycol packet   Trospium Chloride 60 MG Cp24     TAKE these medications   ACCU-CHEK AVIVA PLUS test strip Generic drug:  glucose blood TEST 3 TIMES A DAY What changed:    when to take this  additional instructions   acetaminophen 500 MG tablet Commonly known as:  TYLENOL Take 1,000 mg by mouth every 6 (six) hours as needed (for pain/fever/headaches.).   atorvastatin 40 MG tablet Commonly known as:  LIPITOR TAKE 1 TABLET BY MOUTH EVERY DAY   cephALEXin 500 MG capsule Commonly known as:  KEFLEX Take 1 capsule (500 mg  total) by mouth every 12 (twelve) hours for 2 days.   darifenacin 7.5 MG 24 hr tablet Commonly known as:  ENABLEX Take 1 tablet (7.5 mg total) by mouth daily. Start taking on:  06/21/2017   docusate sodium 50 MG capsule Commonly known as:  COLACE Take 1 capsule (50 mg total) by mouth 2 (two) times daily as needed (for constipation).   gabapentin 300 MG capsule Commonly known as:  NEURONTIN TAKE 2 CAPSULES BY MOUTH 3 TIMES A DAY   KLOR-CON M20 20 MEQ tablet Generic drug:  potassium chloride SA TAKE 1 TABLET BY MOUTH EVERY DAY   magic mouthwash w/lidocaine Soln Take 5 mLs by mouth 4 (four) times daily as needed for mouth pain. 1:1 ratio. DIPHENHYDRAMINE HCL (ANTIHISTAMINES - ETHANOLAMINES),ALUM & MAG HYDROXIDE-SIMETH,NYSTATIN (ANTI-INFECTIVES - THROAT),LIDOCAINE HCL (ANESTHETICS TOPICAL ORAL)   Melatonin 1  MG Tabs Take 1 tablet by mouth at bedtime.   metFORMIN 500 MG tablet Commonly known as:  GLUCOPHAGE TAKE 1 TABLET BY MOUTH TWICE A DAY WITH A MEAL   metoprolol succinate 100 MG 24 hr tablet Commonly known as:  TOPROL-XL TAKE 1 TABLET BY MOUTH EVERY DAY IMMEDIATELY FOLLOWING A MEAL   nitroGLYCERIN 0.4 MG SL tablet Commonly known as:  NITROSTAT Place 1 tablet (0.4 mg total) under the tongue every 5 (five) minutes as needed. Chest pain   omega-3 acid ethyl esters 1 g capsule Commonly known as:  LOVAZA Take 1 g by mouth daily.   polyethylene glycol packet Commonly known as:  MIRALAX / GLYCOLAX Take 17 g by mouth daily. Replaces:  polyethylene glycol powder powder   pramipexole 1.5 MG tablet Commonly known as:  MIRAPEX Take two tablets every night.   tamsulosin 0.4 MG Caps capsule Commonly known as:  FLOMAX Take 0.4 mg by mouth at bedtime.   TOUJEO SOLOSTAR 300 UNIT/ML Sopn Generic drug:  Insulin Glargine INJECT 20 UNITS INTO THE SKIN DAILY AFTER BREAKFAST.   vitamin B-12 1000 MCG tablet Commonly known as:  CYANOCOBALAMIN Take 1 tablet (1,000 mcg total) by mouth daily.   warfarin 5 MG tablet Commonly known as:  COUMADIN Take as directed. If you are unsure how to take this medication, talk to your nurse or doctor. Original instructions:  Take as directed by anticoagulation clinic What changed:    how much to take  how to take this  when to take this  additional instructions      Follow-up Information    [name], Alinda Sierras, MD. Schedule an appointment as soon as possible for a visit in 1 week(s).   Specialty:  Family Medicine Why:  Hospital follow up  Contact information: [address] [place] [address] [phone]          Allergies  Allergen Reactions  . Ace Inhibitors Other (See Comments)    cough  . Codeine Nausea Only and Rash       . Penicillins Rash    Childhood allergy Has patient had a PCN reaction causing immediate rash,  facial/tongue/throat swelling, SOB or lightheadedness with hypotension: Yes Has patient had a PCN reaction causing severe rash involving mucus membranes or skin necrosis: Yes Has patient had a PCN reaction that required hospitalization No Has patient had a PCN reaction occurring within the last 10 years: No If all of the above answers are "NO", then may proceed with Cephalosporin use.     Consultations:  Oncology   Procedures/Studies: Dg Chest 2 View  Result Date: 06/05/2017 CLINICAL DATA:  Weakness,  hypotension EXAM: CHEST - 2 VIEW COMPARISON:  PET-CT dated 04/28/2017 FINDINGS: Mild left basilar scarring/atelectasis. No focal consolidation. No pleural effusion or pneumothorax. The heart is normal in size. Right chest port terminates in the lower SVC. Degenerative changes of the visualized thoracolumbar spine. IMPRESSION: No evidence of acute cardiopulmonary disease. Electronically Signed   By: Julian Hy M.D.   On: 06/05/2017 23:40   Ct Abdomen Pelvis W Contrast  Result Date: 06/18/2017 CLINICAL DATA:  Urinary retention, enlarged prostate, abdominal pain, diarrhea EXAM: CT ABDOMEN AND PELVIS WITH CONTRAST TECHNIQUE: Multidetector CT imaging of the abdomen and pelvis was performed using the standard protocol following bolus administration of intravenous contrast. CONTRAST:  186mL ISOVUE-300 IOPAMIDOL (ISOVUE-300) INJECTION 61% COMPARISON:  PET-CT dated 04/28/2017 FINDINGS: Lower chest: Mild scarring/atelectasis in the lingula and left lower lobe. Hepatobiliary: Liver is within normal limits. Status post cholecystectomy. No intrahepatic or extrahepatic ductal dilatation. Pancreas: Within normal limits. Spleen: Normal in size. Adrenals/Urinary Tract: Stable mild thickening of the left adrenal gland (series 2/image 23). Right adrenal gland is within normal limits. Bilateral renal cysts, measuring up to 6.1 cm in the anterior left lower kidney. Mild bilateral hydronephrosis. Mild urothelial  enhancement involving the distal ureters (series 2/image 66). Irregular thick-walled bladder. Indwelling Foley catheter with nondependent gas. Perivesical stranding (series 2/image 70). Stomach/Bowel: Stomach is within normal limits. No evidence of bowel obstruction. Appendix is not discretely visualized. Vascular/Lymphatic: No evidence of abdominal aortic aneurysm. Atherosclerotic calcifications of the abdominal aorta and branch vessels. No suspicious abdominopelvic lymphadenopathy. Reproductive: Prostatomegaly, with mild enlargement of the central gland. Other: Small volume pelvic mesenteric stranding/fluid (series 2/image 64) with presacral fluid (series 2/image 71). Musculoskeletal: Mild degenerative changes of the visualized thoracolumbar spine. IMPRESSION: Irregular thick-walled bladder with perivesical stranding, suggesting cystitis. Associated mild urothelial enhancement involving the distal ureters, likely reflecting mild ascending infection. Indwelling Foley catheter. Mild bilateral hydronephrosis. Given associated prostatomegaly, this may reflect chronic bladder outlet obstruction. Additional stable ancillary findings as above. Electronically Signed   By: Julian Hy M.D.   On: 06/18/2017 16:43   US Renal  Result Date: 06/06/2017 CLINICAL DATA:  Acute renal injury EXAM: RENAL / URINARY TRACT ULTRASOUND COMPLETE COMPARISON:  04/28/2017 PET-CT FINDINGS: Right Kidney: Length: 12.2 cm. No mass or hydronephrosis is noted. A 5.6 cm cyst is noted in the midportion of the right kidney similar to that seen on prior PET-CT. Smaller cyst is noted as well also stable from the recent PET-CT. Left Kidney: Length: 12.4 cm. No mass or hydronephrosis is noted. Renal cystic changes noted similar to that seen on prior CT T. The largest of these measures 5.1 cm in greatest dimension. Bladder: Decompressed by Foley catheter. IMPRESSION: Bilateral renal cystic change without acute abnormality. Electronically Signed    By: Inez Catalina M.D.   On: 06/06/2017 15:56   Dg Abd Portable 1v  Result Date: 06/12/2017 CLINICAL DATA:  Constipation. EXAM: PORTABLE ABDOMEN - 1 VIEW COMPARISON:  04/28/2017 PET CT and prior studies FINDINGS: A moderate amount of stool within the RIGHT colon noted. Moderate gas in the transverse colon present. No distended small bowel loops are identified. A catheter overlying the pelvis is present. No acute bony abnormalities are identified. No suspicious calcifications are present. IMPRESSION: Moderate RIGHT colonic stool and moderate transverse colonic distension which may represent a mild ileus. No evidence of small bowel obstruction. Electronically Signed   By: Margarette Canada M.D.   On: 06/12/2017 14:28    Discharge Exam: Vitals:   06/20/17 1112 06/20/17  1436  BP: (!) 115/59 111/64  Pulse: 79 85  Resp: 17 14  Temp: 98.6 F (37 C) 98.9 F (37.2 C)  SpO2: 97% 96%   Vitals:   06/20/17 0311 06/20/17 0619 06/20/17 1112 06/20/17 1436  BP: 119/61 115/63 (!) 115/59 111/64  Pulse: 86 81 79 85  Resp: 18 18 17 14   Temp: 97.8 F (36.6 C) 98.2 F (36.8 C) 98.6 F (37 C) 98.9 F (37.2 C)  TempSrc: Oral Oral  Oral  SpO2: 95% 97% 97% 96%  Weight:      Height:        General: Pt is alert, awake, not in acute distress Cardiovascular: RRR, S1/S2 +, no rubs, no gallops, Port-cath in place  Respiratory: CTA bilaterally, no wheezing, no rhonchi Abdominal: Soft, NT, ND, bowel sounds + GU: Foley in place urine clear  Extremities: no edema, no cyanosis  The results of significant diagnostics from this hospitalization (including imaging, microbiology, ancillary and laboratory) are listed below for reference.     Microbiology: Recent Results (from the past 240 hour(s))  Urine Culture     Status: None   Collection Time: 06/11/17  1:54 PM  Result Value Ref Range Status   Specimen Description   Final    URINE, CATHETERIZED Performed at [facility] [address]  [place], [place] [address]    Special Requests   Final    Normal Performed at [facility], [place] [place] [address], [place], [place] [address]    Culture   Final    NO GROWTH Performed at [facility], [place] [address] [place], [place] [address]    Report Status 06/12/2017 FINAL  Final  Urine culture     Status: None   Collection Time: 06/16/17  8:49 PM  Result Value Ref Range Status   Specimen Description   Final    URINE, CATHETERIZED Performed at [facility], [place] [address], [place], [place] [address]    Special Requests   Final    NONE Performed at [facility], [place] [address] [place], [place] [address]    Culture   Final    NO GROWTH Performed at [facility], [place] [address] [place], [place] [address]    Report Status 06/18/2017 FINAL  Final  Blood Culture (routine x 2)     Status: None (Preliminary result)   Collection Time: 06/16/17 10:01 PM  Result Value Ref Range Status   Specimen Description   Final    BLOOD LEFT ANTECUBITAL Performed at [facility] [address], [place], [place] [address]    Special Requests   Final    BOTTLES DRAWN AEROBIC AND ANAEROBIC Blood Culture results may not be optimal due to an excessive volume of blood received in culture bottles Performed at [facility] [address], [place], [place] [address]    Culture   Final    NO GROWTH 4 DAYS Performed at [facility], [place] [address] [place], [place] [address]    Report Status PENDING  Incomplete  Blood Culture (routine x 2)     Status: None (Preliminary result)   Collection Time: 06/16/17 10:02 PM  Result Value Ref Range Status   Specimen Description   Final    BLOOD RIGHT CHEST Performed at [facility] [address], [place],  [address]    Special Requests   Final    BOTTLES DRAWN  AEROBIC AND ANAEROBIC Blood Culture adequate volume Performed at  [facility] [address] [place], [place] [address]    Culture   Final    NO GROWTH 4 DAYS Performed at [facility], [place] [address], [place], [place] [address]    Report Status PENDING  Incomplete  C difficile quick scan w PCR reflex     Status: None   Collection Time: 06/17/17 11:30 AM  Result Value Ref Range Status   C Diff antigen NEGATIVE NEGATIVE Final   C Diff toxin NEGATIVE NEGATIVE Final   C Diff interpretation No C. difficile detected.  Final    Comment: Performed at [facility], [place] [address] [place], [place] [address]  Gastrointestinal Panel by PCR , Stool     Status: None   Collection Time: 06/17/17 11:30 AM  Result Value Ref Range Status   Campylobacter species NOT DETECTED NOT DETECTED Final   Plesimonas shigelloides NOT DETECTED NOT DETECTED Final   Salmonella species NOT DETECTED NOT DETECTED Final   Yersinia enterocolitica NOT DETECTED NOT DETECTED Final   Vibrio species NOT DETECTED NOT DETECTED Final   Vibrio cholerae NOT DETECTED NOT DETECTED Final   Enteroaggregative E coli (EAEC) NOT DETECTED NOT DETECTED Final   Enteropathogenic E coli (EPEC) NOT DETECTED NOT DETECTED Final   Enterotoxigenic E coli (ETEC) NOT DETECTED NOT DETECTED Final   Shiga like toxin producing E coli (STEC) NOT DETECTED NOT DETECTED Final   Shigella/Enteroinvasive E coli (EIEC) NOT DETECTED NOT DETECTED Final   Cryptosporidium NOT DETECTED NOT DETECTED Final   Cyclospora cayetanensis NOT DETECTED NOT DETECTED Final   Entamoeba histolytica NOT DETECTED NOT DETECTED Final   Giardia lamblia NOT DETECTED NOT DETECTED Final   Adenovirus F40/41 NOT DETECTED NOT DETECTED Final   Astrovirus NOT DETECTED NOT DETECTED Final   Norovirus GI/GII NOT DETECTED NOT DETECTED Final   Rotavirus A NOT DETECTED NOT DETECTED Final   Sapovirus (I, II, IV, and V) NOT DETECTED NOT DETECTED Final    Comment: Performed at [facility], [place]., [place], [place] [address]     Labs: BNP (last 3 results) No results for input(s): BNP in the last 8760 hours. Basic Metabolic Panel: Recent Labs  Lab 06/16/17 2201 06/18/17 0511 06/19/17 0439 06/20/17 0517  NA 131*  --  140 137  K 3.7  --  2.9* 3.0*  CL 98*  --  112* 109  CO2 23  --  23 24  GLUCOSE 132*  --  135* 141*  BUN 19  --  11 11  CREATININE 1.38* 0.95 0.95 0.88  CALCIUM 8.8*  --  7.7* 8.0*  MG  --   --  1.3* 1.6*   Liver Function Tests: Recent Labs  Lab 06/16/17 2201 06/19/17 0439  AST 25 15  ALT 11* 8*  ALKPHOS 94 64  BILITOT 0.5 0.4  PROT 7.0 4.9*  ALBUMIN 3.2* 2.0*   Recent Labs  Lab 06/16/17 2201  LIPASE 39   No results for input(s): AMMONIA in the last 168 hours. CBC: Recent Labs  Lab 06/16/17 2201 06/19/17 0439 06/20/17 0517  WBC 4.4 3.4* 2.2*  NEUTROABS 3.3 2.4 1.4*  HGB 12.6* 9.2* 9.1*  HCT 37.6* 27.5* 27.5*  MCV 92.8 92.9 93.2  PLT 92* 70* 71*   Cardiac Enzymes: No results for input(s): CKTOTAL, CKMB, CKMBINDEX, TROPONINI in the last 168 hours. BNP: Invalid input(s): POCBNP CBG: Recent Labs  Lab 06/19/17 1142  06/19/17 1631 06/19/17 2256 06/20/17 0752 06/20/17 1109  GLUCAP 148* 131* 157* 119* 145*   D-Dimer No results for input(s): DDIMER in the last 72 hours. Hgb A1c No results for input(s): HGBA1C in the last 72 hours. Lipid Profile No results for input(s): CHOL, HDL, LDLCALC, TRIG, CHOLHDL, LDLDIRECT in the last 72 hours. Thyroid function studies No results for input(s): TSH, T4TOTAL, T3FREE, THYROIDAB in the last 72 hours.  Invalid input(s): FREET3 Anemia work up No results for input(s): VITAMINB12, FOLATE, FERRITIN, TIBC, IRON, RETICCTPCT in the last 72 hours. Urinalysis    Component Value Date/Time   COLORURINE YELLOW 06/16/2017 2049   APPEARANCEUR CLOUDY (A) 06/16/2017 2049   LABSPEC 1.009 06/16/2017 2049   PHURINE 6.0 06/16/2017 2049   GLUCOSEU 50 (A) 06/16/2017 2049   HGBUR LARGE (A) 06/16/2017 2049    HGBUR trace-intact 12/29/2006 0827   BILIRUBINUR NEGATIVE 06/16/2017 2049   [place] 06/16/2017 2049   PROTEINUR 100 (A) 06/16/2017 2049   UROBILINOGEN 0.2 12/29/2006 0827   NITRITE NEGATIVE 06/16/2017 2049   LEUKOCYTESUR LARGE (A) 06/16/2017 2049   Sepsis Labs Invalid input(s): PROCALCITONIN,  WBC,  LACTICIDVEN Microbiology Recent Results (from the past 240 hour(s))  Urine Culture     Status: None   Collection Time: 06/11/17  1:54 PM  Result Value Ref Range Status   Specimen Description   Final    URINE, CATHETERIZED Performed at [facility], [place] [address] [place], [place] [address]    Special Requests   Final    Normal Performed at [facility], [place] [address] [place], [place] [address]    Culture   Final    NO GROWTH Performed at [facility], [place] [address], [place], [place] [address]    Report Status 06/12/2017 FINAL  Final  Urine culture     Status: None   Collection Time: 06/16/17  8:49 PM  Result Value Ref Range Status   Specimen Description   Final    URINE, CATHETERIZED Performed at [facility], [place] [address], [place], [place] [address]    Special Requests   Final    NONE Performed at [facility], [place] [address], [place], [place] [address]    Culture   Final    NO GROWTH Performed at [facility], [place] [address], [place], [place] [address]    Report Status 06/18/2017 FINAL  Final  Blood Culture (routine x 2)     Status: None (Preliminary result)   Collection Time: 06/16/17 10:01 PM  Result Value Ref Range Status   Specimen Description   Final    BLOOD LEFT ANTECUBITAL Performed at [facility] [address], [place], [place] [address]    Special Requests   Final    BOTTLES DRAWN AEROBIC AND ANAEROBIC Blood Culture results may not be optimal due to an excessive volume of blood received in culture bottles Performed at [facility] [address] [place], [place] [address]    Culture   Final    NO GROWTH 4 DAYS Performed at [facility], [place] [address] [place], [place] [address]    Report Status PENDING  Incomplete  Blood Culture (routine x 2)     Status: None (Preliminary result)   Collection Time: 06/16/17 10:02 PM  Result Value Ref Range Status   Specimen Description   Final    BLOOD RIGHT CHEST Performed at [facility], [place] [address], [place], [place] [address]    Special Requests   Final    BOTTLES DRAWN AEROBIC AND ANAEROBIC Blood Culture adequate volume Performed at [facility] [address], [place], [place] [address]    Culture   Final    NO GROWTH 4 DAYS Performed at [facility], [place] [address] [place], [place] [address]    Report Status PENDING  Incomplete  C difficile quick scan w PCR reflex     Status: None   Collection Time: 06/17/17 11:30 AM  Result Value Ref Range Status   C Diff antigen NEGATIVE NEGATIVE Final   C Diff toxin NEGATIVE NEGATIVE Final   C Diff interpretation No C. difficile detected.  Final    Comment: Performed at [facility], [place] [address] [place], [place] [address]  Gastrointestinal Panel by PCR , Stool     Status: None   Collection Time: 06/17/17 11:30 AM  Result Value Ref Range Status   Campylobacter species NOT DETECTED NOT DETECTED Final   Plesimonas shigelloides NOT DETECTED NOT DETECTED Final   Salmonella species NOT DETECTED NOT DETECTED Final   Yersinia enterocolitica NOT DETECTED NOT DETECTED Final   Vibrio species NOT DETECTED NOT DETECTED Final   Vibrio cholerae NOT DETECTED NOT DETECTED Final   Enteroaggregative E coli (EAEC) NOT DETECTED NOT DETECTED Final   Enteropathogenic E coli (EPEC) NOT DETECTED NOT DETECTED Final   Enterotoxigenic E coli (ETEC) NOT DETECTED NOT DETECTED Final   Shiga like toxin producing E coli (STEC) NOT DETECTED NOT DETECTED Final    Shigella/Enteroinvasive E coli (EIEC) NOT DETECTED NOT DETECTED Final   Cryptosporidium NOT DETECTED NOT DETECTED Final   Cyclospora cayetanensis NOT DETECTED NOT DETECTED Final   Entamoeba histolytica NOT DETECTED NOT DETECTED Final   Giardia lamblia NOT DETECTED NOT DETECTED Final   Adenovirus F40/41 NOT DETECTED NOT DETECTED Final   Astrovirus NOT DETECTED NOT DETECTED Final   Norovirus GI/GII NOT DETECTED NOT DETECTED Final   Rotavirus A NOT DETECTED NOT DETECTED Final   Sapovirus (I, II, IV, and V) NOT DETECTED NOT DETECTED Final    Comment: Performed at [facility], [address] [place],  [address]     Time coordinating discharge: 35 minutes  SIGNED:  Chipper Oman, MD  [facility] 06/20/2017, 3:05 PM  Pager please text page via  [email or web address]  Note - This record has been created using [employer]. Chart creation errors have been sought, but may not always have been located. Such creation errors do not reflect on the standard of medical care.

## 2017-06-20 NOTE — Care Management Note (Signed)
Case Management Note  Patient Details  Name: Gary Yates. MRN: [id number] Date of Birth: 1933/12/27  Subjective/Objective: [facility]-rep Sharyn Lull aware of d/c today HHPT,aide. D/c home w/foley cath.  No further CM needs.                 Action/Plan:d/c home w/HHC.   Expected Discharge Date:                  Expected Discharge Plan:  [place]  In-House Referral:     Discharge planning Services  CM Consult  Post Acute Care Choice:  Home Health(Active w/[facility]/PT/OT) Choice offered to:  Patient  DME Arranged:    DME Agency:     HH Arranged:  PT, Nurse's Aide [place] Agency:     Status of Service:  Completed, signed off  If discussed at [place] of Stay Meetings, dates discussed:    Additional Comments:  Dessa Phi, RN 06/20/2017, 1:24 PM

## 2017-06-21 LAB — CULTURE, BLOOD (ROUTINE X 2)
Culture: NO GROWTH
Culture: NO GROWTH
Special Requests: ADEQUATE

## 2017-06-23 ENCOUNTER — Telehealth: Payer: Self-pay | Admitting: Family Medicine

## 2017-06-23 ENCOUNTER — Telehealth: Payer: Self-pay | Admitting: *Deleted

## 2017-06-23 DIAGNOSIS — I480 Paroxysmal atrial fibrillation: Secondary | ICD-10-CM

## 2017-06-23 DIAGNOSIS — R600 Localized edema: Secondary | ICD-10-CM

## 2017-06-23 DIAGNOSIS — G63 Polyneuropathy in diseases classified elsewhere: Secondary | ICD-10-CM

## 2017-06-23 DIAGNOSIS — R269 Unspecified abnormalities of gait and mobility: Secondary | ICD-10-CM

## 2017-06-23 DIAGNOSIS — C8589 Other specified types of non-Hodgkin lymphoma, extranodal and solid organ sites: Secondary | ICD-10-CM

## 2017-06-23 DIAGNOSIS — R531 Weakness: Secondary | ICD-10-CM

## 2017-06-23 NOTE — Telephone Encounter (Signed)
Unable to reach patient at time of TCM Call. Left message for patient to return call when available.  

## 2017-06-23 NOTE — Telephone Encounter (Signed)
Spoke w/ patient's daughter for TCM. Patient is due for INR check sometime between now and Wednesday per discharge summary. Daughter does not feel that they can get patient to the office in this time frame due to his weakness.   Can we also call orders for [facility] RN to check INR on Tuesday or Wednesday please?

## 2017-06-23 NOTE — Telephone Encounter (Signed)
Copied from [place] [phone]. Topic: Quick Communication - See Telephone Encounter >> Jun 23, 2017  1:39 PM Neva Seat wrote: [facility]  Verbal orders for PT: 2 times a week for 4 weeks  Add:  nursing evaluation           script for a lift chair - having trouble getting up from a chair

## 2017-06-23 NOTE — Telephone Encounter (Signed)
Yes- OK to order home health INR check for Wed

## 2017-06-23 NOTE — Telephone Encounter (Signed)
Transition Care Management Follow-up Telephone Call  Per Discharge Summary:  Admit date: 06/16/2017 Discharge date: 06/20/2017  Admitted From: Home  Disposition:  Home   Recommendations for Outpatient Follow-up:  1. Follow up with PCP in 1 week  2. Follow-up with urology in 1 week 3. Please obtain BMP/CBC in one week function, electrolytes and hemoglobin 4. Follow-up with oncology 5. Check INR in 3 to 5 days  Home Health: PT/OT/Aide  Equipment/Devices: Foley catheter  Discharge Condition: Stable  CODE STATUS: Full Code  Diet recommendation: Heart Healthy / Carb Modified   --  Call completed w/ patient's daughter, [name] (on [place])   How have you been since you were released from the hospital? "He's kind of weak. We just had PT come out and they just called you guys for orders. It's slow. He's still got the catheter. He might have that for another week and then take it out."   Do you understand why you were in the hospital? yes   Do you understand the discharge instructions? yes   Where were you discharged to? Home   Items Reviewed:  Medications reviewed: yes  Allergies reviewed: yes  Dietary changes reviewed: yes  Referrals reviewed: yes   Functional Questionnaire:   Activities of Daily Living (ADLs):   He states they are independent in the following: feeding and grooming States they require assistance with the following: ambulation, bathing and hygiene, continence, toileting and dressing. Patient's wife and daughter and [facility] PT are assisting.   Any transportation issues/concerns?: no   Any patient concerns? no   Confirmed importance and date/time of follow-up visits scheduled no, patient's daughter needs to check with her other siblings to arrange transportation.   Confirmed with patient if condition begins to worsen call PCP or go to the ER.  Patient was given the office number and encouraged to call back with question or concerns.  : yes

## 2017-06-24 NOTE — Telephone Encounter (Signed)
ok 

## 2017-06-24 NOTE — Telephone Encounter (Signed)
Verbal orders given for INR and PT.  Okay to order lift chair?

## 2017-06-25 ENCOUNTER — Ambulatory Visit (INDEPENDENT_AMBULATORY_CARE_PROVIDER_SITE_OTHER): Payer: Medicare Other | Admitting: General Practice

## 2017-06-25 ENCOUNTER — Telehealth: Payer: Self-pay | Admitting: Family Medicine

## 2017-06-25 DIAGNOSIS — Z7901 Long term (current) use of anticoagulants: Secondary | ICD-10-CM

## 2017-06-25 DIAGNOSIS — I480 Paroxysmal atrial fibrillation: Secondary | ICD-10-CM

## 2017-06-25 LAB — POCT INR: INR: 3 (ref 2.0–3.0)

## 2017-06-25 NOTE — Telephone Encounter (Signed)
Copied from [place] [phone]. Topic: General - Other >> Jun 25, 2017  4:53 PM Valla Leaver wrote: Reason for CRM: Will, OT with [facility] requesting verbal order for treatment plan with frequency of2 x a wk for 1wk and 1x a wk for 2wks.

## 2017-06-25 NOTE — Telephone Encounter (Signed)
Copied from [place] [phone]. Topic: Quick Communication - See Telephone Encounter >> Jun 25, 2017 12:29 PM Gardiner Ramus wrote: CRM for notification. See Telephone encounter for: 06/25/17. Marlowe Kays from [facility] is sending verbal orders for skilled nurse for once a week for the next 6 weeks. Marlowe Kays cb# [phone]

## 2017-06-25 NOTE — Telephone Encounter (Signed)
OK 

## 2017-06-25 NOTE — Telephone Encounter (Signed)
Order placed

## 2017-06-25 NOTE — Telephone Encounter (Signed)
Verbal orders given to Connie. 

## 2017-06-25 NOTE — Patient Instructions (Addendum)
Pre visit review using our clinic review tool, if applicable. No additional management support is needed unless otherwise documented below in the visit note.  Continue to take 1 1/2 tablets daily except 1 tablet on Wednesdays. Re-check on Monday.  Dosing instructions given to [place], RN @ [facility] while in patient's home.

## 2017-06-26 NOTE — Telephone Encounter (Signed)
Sent to PCP for the OK for verbal orders  

## 2017-06-27 NOTE — Telephone Encounter (Signed)
ok 

## 2017-06-27 NOTE — Telephone Encounter (Signed)
Left message on machine for Will with verbal orders for OT 

## 2017-06-27 NOTE — Telephone Encounter (Signed)
Sent to [place]

## 2017-06-29 ENCOUNTER — Encounter (HOSPITAL_COMMUNITY): Payer: Self-pay

## 2017-06-29 ENCOUNTER — Emergency Department (HOSPITAL_COMMUNITY)
Admission: EM | Admit: 2017-06-29 | Discharge: 2017-06-29 | Disposition: A | Discharge status: Home / Self Care | Payer: Medicare Other | Attending: Emergency Medicine | Admitting: Emergency Medicine

## 2017-06-29 DIAGNOSIS — E1140 Type 2 diabetes mellitus with diabetic neuropathy, unspecified: Secondary | ICD-10-CM | POA: Diagnosis not present

## 2017-06-29 DIAGNOSIS — J449 Chronic obstructive pulmonary disease, unspecified: Secondary | ICD-10-CM | POA: Insufficient documentation

## 2017-06-29 DIAGNOSIS — Z87891 Personal history of nicotine dependence: Secondary | ICD-10-CM | POA: Insufficient documentation

## 2017-06-29 DIAGNOSIS — I2510 Atherosclerotic heart disease of native coronary artery without angina pectoris: Secondary | ICD-10-CM | POA: Diagnosis not present

## 2017-06-29 DIAGNOSIS — Z923 Personal history of irradiation: Secondary | ICD-10-CM | POA: Insufficient documentation

## 2017-06-29 DIAGNOSIS — R319 Hematuria, unspecified: Secondary | ICD-10-CM | POA: Diagnosis not present

## 2017-06-29 DIAGNOSIS — N183 Chronic kidney disease, stage 3 (moderate): Secondary | ICD-10-CM | POA: Diagnosis not present

## 2017-06-29 DIAGNOSIS — E1122 Type 2 diabetes mellitus with diabetic chronic kidney disease: Secondary | ICD-10-CM | POA: Diagnosis not present

## 2017-06-29 DIAGNOSIS — Z8572 Personal history of non-Hodgkin lymphomas: Secondary | ICD-10-CM | POA: Insufficient documentation

## 2017-06-29 DIAGNOSIS — I129 Hypertensive chronic kidney disease with stage 1 through stage 4 chronic kidney disease, or unspecified chronic kidney disease: Secondary | ICD-10-CM | POA: Diagnosis not present

## 2017-06-29 DIAGNOSIS — Z79899 Other long term (current) drug therapy: Secondary | ICD-10-CM | POA: Diagnosis not present

## 2017-06-29 DIAGNOSIS — Z7984 Long term (current) use of oral hypoglycemic drugs: Secondary | ICD-10-CM | POA: Diagnosis not present

## 2017-06-29 DIAGNOSIS — N3001 Acute cystitis with hematuria: Secondary | ICD-10-CM

## 2017-06-29 DIAGNOSIS — Z7901 Long term (current) use of anticoagulants: Secondary | ICD-10-CM | POA: Diagnosis not present

## 2017-06-29 LAB — CBC WITH DIFFERENTIAL/PLATELET
Basophils Absolute: 0 10*3/uL (ref 0.0–0.1)
Basophils Relative: 0 %
Eosinophils Absolute: 0.1 10*3/uL (ref 0.0–0.7)
Eosinophils Relative: 1 %
HEMATOCRIT: 31.1 % — AB (ref 39.0–52.0)
HEMOGLOBIN: 9.9 g/dL — AB (ref 13.0–17.0)
LYMPHS ABS: 0.8 10*3/uL (ref 0.7–4.0)
LYMPHS PCT: 16 %
MCH: 30.1 pg (ref 26.0–34.0)
MCHC: 31.8 g/dL (ref 30.0–36.0)
MCV: 94.5 fL (ref 78.0–100.0)
MONOS PCT: 11 %
Monocytes Absolute: 0.6 10*3/uL (ref 0.1–1.0)
NEUTROS ABS: 3.9 10*3/uL (ref 1.7–7.7)
NEUTROS PCT: 72 %
Platelets: 137 10*3/uL — ABNORMAL LOW (ref 150–400)
RBC: 3.29 MIL/uL — ABNORMAL LOW (ref 4.22–5.81)
RDW: 16.2 % — ABNORMAL HIGH (ref 11.5–15.5)
WBC: 5.3 10*3/uL (ref 4.0–10.5)

## 2017-06-29 LAB — URINALYSIS, ROUTINE W REFLEX MICROSCOPIC

## 2017-06-29 LAB — PROTIME-INR
INR: 5.54 — AB
Prothrombin Time: 49.9 seconds — ABNORMAL HIGH (ref 11.4–15.2)

## 2017-06-29 LAB — COMPREHENSIVE METABOLIC PANEL
ALT: 7 U/L — ABNORMAL LOW (ref 17–63)
ANION GAP: 6 (ref 5–15)
AST: 16 U/L (ref 15–41)
Albumin: 2.7 g/dL — ABNORMAL LOW (ref 3.5–5.0)
Alkaline Phosphatase: 81 U/L (ref 38–126)
BILIRUBIN TOTAL: 0.4 mg/dL (ref 0.3–1.2)
BUN: 13 mg/dL (ref 6–20)
CALCIUM: 8.6 mg/dL — AB (ref 8.9–10.3)
CO2: 25 mmol/L (ref 22–32)
CREATININE: 0.92 mg/dL (ref 0.61–1.24)
Chloride: 107 mmol/L (ref 101–111)
GFR calc non Af Amer: >60 mL/min (ref >60)
GLUCOSE: 88 mg/dL (ref 65–99)
Potassium: 4.1 mmol/L (ref 3.5–5.1)
Sodium: 138 mmol/L (ref 135–145)
TOTAL PROTEIN: 5.8 g/dL — AB (ref 6.5–8.1)

## 2017-06-29 LAB — URINALYSIS, MICROSCOPIC (REFLEX): RBC / HPF: >50 RBC/hpf (ref 0–5)

## 2017-06-29 MED ORDER — CEPHALEXIN 500 MG PO CAPS: 500.0000 mg | ORAL_CAPSULE | Freq: Four times a day (QID) | ORAL | 0 refills | Status: DC

## 2017-06-29 MED ORDER — CEPHALEXIN 500 MG PO CAPS
500.0000 mg | ORAL_CAPSULE | Freq: Once | ORAL | Status: AC
  Administered 2017-06-29: 500 mg via ORAL
  Filled 2017-06-29: qty 1

## 2017-06-29 MED ORDER — ACETAMINOPHEN 325 MG PO TABS
650.0000 mg | ORAL_TABLET | Freq: Once | ORAL | Status: AC
  Administered 2017-06-29: 650 mg via ORAL
  Filled 2017-06-29: qty 2

## 2017-06-29 MED ORDER — HEPARIN SOD (PORK) LOCK FLUSH 100 UNIT/ML IV SOLN
500.0000 [IU] | Freq: Once | INTRAVENOUS | Status: AC
  Administered 2017-06-29: 500 [IU]
  Filled 2017-06-29: qty 5

## 2017-06-29 NOTE — Discharge Instructions (Addendum)
On your Coumadin tonight only take a half a pill instead of the 1-1/2 that you would take tonight.  Follow-up Tuesday with the urologist as planned.

## 2017-06-29 NOTE — ED Notes (Signed)
I changed the pts urine drainage bag, pts bag port was clotted.

## 2017-06-29 NOTE — ED Notes (Signed)
Bed: WA23 Expected date:  Expected time:  Means of arrival:  Comments: EMS 

## 2017-06-29 NOTE — ED Triage Notes (Signed)
Patient presented to ed with c/o hematuria. Patient had foley catheter place on Friday 621/19. Patient denies any pain at this time. Patient state hematuria state yesterday off and on but this morning got worse.

## 2017-06-29 NOTE — ED Provider Notes (Signed)
[facility] DEPT Provider Note   CSN: [id number] Arrival date & time: 06/29/17  5573     History   Chief Complaint Chief Complaint  Patient presents with  . Hematuria    HPI Gary Yates. is a 84 y.o. male.  Patient was brought in because he saw blood in his Foley but also he needed his Foley bag replaced.  The history is provided by the patient. No language interpreter was used.  Hematuria  This is a new problem. The current episode started 12 to 24 hours ago. The problem occurs constantly. The problem has not changed since onset.Pertinent negatives include no chest pain, no abdominal pain and no headaches. Nothing aggravates the symptoms. Nothing relieves the symptoms. He has tried nothing for the symptoms. The treatment provided no relief.    Past Medical History:  Diagnosis Date  . Anemia in chronic renal disease 05/07/2017  . Anxiety   . Atrial fibrillation ([place])   . COPD (chronic obstructive pulmonary disease) ([place])    pt. denies  . Coronary artery disease    a. h/o Overlapping stents RCA;  b. 06/2011 Cath: patent stents, nonobs dzs, NL EF.  . Diabetic peripheral neuropathy ([place])   . Diffuse non-Hodgkin's lymphoma of testis ([place]) 09/28/2015  . DM (diabetes mellitus) ([place])    Type 2, peripheral neuropathy.  . Dyspnea    with exertion  . Dysrhythmia   . GERD (gastroesophageal reflux disease)   . Headache   . History of bronchitis   . History of kidney stones   . History of radiation therapy 02/19/16 - 03/13/16   Testis/Scrotum: 32.4 Gy in 18 fractions  . History of radiation therapy 08/07/16-08/20/16   left adrenal gland mass treated to 30 Gy in 10 fractions  . Hyperlipidemia   . Hypertension   . Low testosterone   . Nephrolithiasis   . Osteoarthritis    shoulder  . Restless leg   . SVT (supraventricular tachycardia) ([place])   . Urinary frequency   . Wears partial dentures    upper and lower    Patient Active Problem List   Diagnosis Date Noted  . Sepsis secondary to UTI ([place]) 06/17/2017  . Thrombocytopenia ([place]) 06/17/2017  . Acute urinary retention 06/17/2017  . Sepsis ([place]) 06/06/2017  . Anemia in chronic renal disease 05/07/2017  . Long term (current) use of anticoagulants 12/10/2016  . V-tach ([place]) 07/02/2016  . Protein-calorie malnutrition, severe 07/02/2016  . Bilateral lower extremity edema 07/01/2016  . Bilateral leg edema 06/11/2016  . 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 Lumbosacral spondylosis with radiculopathy 02/07/2015  . At high risk for falls 05/05/2014  . Encounter for therapeutic drug monitoring 03/15/2013  . Chest pain 02/03/2013  . Obesity (BMI 30-39.9) 09/21/2012  . Poorly controlled type 2 diabetes mellitus with neuropathy ([place]) 07/03/2012  . Chronic kidney disease, stage III (moderate) ([place]) 07/03/2012  . Restless leg syndrome 12/04/2011  . Long term current use of anticoagulant therapy 07/15/2011  . Paroxysmal atrial fibrillation ([place]) 06/21/2011  . Upper extremity weakness 06/21/2011  . Midsternal chest pain 06/19/2011  . Coronary artery disease   . Hypertension   . Hyperlipidemia   . COPD (chronic obstructive pulmonary disease) ([place])   . GERD (gastroesophageal reflux disease)   . Peripheral neuropathy   . Dyslipidemia 03/27/2011  . Diabetic peripheral neuropathy ([place]) 12/17/2010  . Hypotestosteronism 05/07/2010  . LEG CRAMPS, IDIOPATHIC 08/14/2009  . Insomnia 08/14/2009  . Malignant neoplasm of  skin of face 08/14/2009  . OBESITY 08/02/2009  . HYPERTENSION, HEART CONTROLLED W/O ASSOC CHF 03/28/2008  . CAD, NATIVE VESSEL 03/28/2008  . G E R D 01/12/2007  . Morbid obesity ([place]) 09/15/2006  . Hyperlipidemia, mixed 07/01/2006  . Essential hypertension 07/01/2006  . Coronary atherosclerosis 07/01/2006  . COPD 07/01/2006  . OSTEOARTHRITIS 07/01/2006    Past Surgical History:  Procedure Laterality Date  . APPENDECTOMY    . [place]  . CARDIAC CATHETERIZATION  01/2013  . CATARACT EXTRACTION, BILATERAL    . CHOLECYSTECTOMY    . COLONOSCOPY    . CORONARY ANGIOPLASTY  2004  . EYE SURGERY Bilateral    cataracts  . IR GENERIC HISTORICAL  10/05/2015   IR US GUIDE VASC ACCESS RIGHT 10/05/2015 Marybelle Killings, MD [facility]INTER[facility]  . IR GENERIC HISTORICAL  10/05/2015   IR FLUORO GUIDE PORT INSERTION RIGHT 10/05/2015 Marybelle Killings, MD [facility]  . LEFT HEART CATHETERIZATION WITH CORONARY ANGIOGRAM N/A 06/18/2011   Procedure: LEFT HEART CATHETERIZATION WITH CORONARY ANGIOGRAM;  Surgeon: Peter M Martinique, MD;  Location: [facility] CATH LAB;  Service: Cardiovascular;  Laterality: N/A;  . LEFT HEART CATHETERIZATION WITH CORONARY ANGIOGRAM N/A 01/27/2013   Procedure: LEFT HEART CATHETERIZATION WITH CORONARY ANGIOGRAM;  Surgeon: Burnell Blanks, MD;  Location: [facility] CATH LAB;  Service: Cardiovascular;  Laterality: N/A;  . LUMBAR LAMINECTOMY/DECOMPRESSION MICRODISCECTOMY N/A 02/07/2015   Procedure: Lumbar three-Sacral one Decompression;  Surgeon: Kevan Ny Ditty, MD;  Location: [facility] NEURO ORS;  Service: Neurosurgery;  Laterality: N/A;  L3 to S1 Decompression  . MULTIPLE TOOTH EXTRACTIONS    . ORCHIECTOMY Right 09/01/2015   Procedure: RIGHT ORCHIECTOMY;  Surgeon: Kathie Rhodes, MD;  Location: [facility] ORS;  Service: Urology;  Laterality: Right;  . ROTATOR CUFF REPAIR Left         Home Medications    Prior to Admission medications   Medication Sig Start Date End Date Taking? Authorizing Provider  acetaminophen (TYLENOL) 500 MG tablet Take 1,000 mg by mouth every 6 (six) hours as needed (for pain/fever/headaches.).    Yes [provider]  atorvastatin (LIPITOR) 40 MG tablet TAKE 1 TABLET BY MOUTH EVERY DAY 05/30/17  Yes [name], Alinda Sierras, MD  darifenacin (ENABLEX) 7.5 MG 24 hr tablet Take 1 tablet (7.5 mg total) by mouth daily. 06/21/17  Yes Patrecia Pour, Christean Grief, MD  gabapentin (NEURONTIN) 300 MG capsule TAKE 2 CAPSULES BY MOUTH 3 TIMES A  DAY 04/28/17  Yes [name], Alinda Sierras, MD  KLOR-CON M20 20 MEQ tablet TAKE 1 TABLET BY MOUTH EVERY DAY 05/19/17  Yes [name], Alinda Sierras, MD  Melatonin 1 MG TABS Take 1 tablet by mouth at bedtime.    Yes [provider]  metFORMIN (GLUCOPHAGE) 500 MG tablet TAKE 1 TABLET BY MOUTH TWICE A DAY WITH A MEAL 09/16/16  Yes [name], Alinda Sierras, MD  metoprolol succinate (TOPROL-XL) 100 MG 24 hr tablet TAKE 1 TABLET BY MOUTH EVERY DAY IMMEDIATELY FOLLOWING A MEAL 03/28/17  Yes [name], Alinda Sierras, MD  omega-3 acid ethyl esters (LOVAZA) 1 g capsule Take 1 g by mouth daily.   Yes [provider]  pramipexole (MIRAPEX) 1.5 MG tablet Take two tablets every night. 09/10/16  Yes [name], Alinda Sierras, MD  tamsulosin (FLOMAX) 0.4 MG CAPS capsule Take 0.4 mg by mouth at bedtime.   Yes [provider]  TOUJEO SOLOSTAR 300 UNIT/ML SOPN INJECT 20 UNITS INTO THE SKIN DAILY AFTER BREAKFAST. 09/24/16  Yes [name], Alinda Sierras,  MD  vitamin B-12 (CYANOCOBALAMIN) 1000 MCG tablet Take 1 tablet (1,000 mcg total) by mouth daily. 06/20/17  Yes Patrecia Pour, Christean Grief, MD  warfarin (COUMADIN) 5 MG tablet Take as directed by anticoagulation clinic Patient taking differently: Take 5-7.5 mg by mouth as directed. 7.5 MG on (Sunday, Monday, Tuesday, Thursday, Friday and Saturday) 5 MG on Wednesday 05/28/17  Yes [name], Alinda Sierras, MD  ACCU-CHEK AVIVA PLUS test strip TEST 3 TIMES A DAY Patient taking differently: TEST 2 TIMES A DAY 10/27/15   [name], Alinda Sierras, MD  cephALEXin (KEFLEX) 500 MG capsule Take 1 capsule (500 mg total) by mouth 4 (four) times daily. 06/29/17   Milton Ferguson, MD  docusate sodium (COLACE) 50 MG capsule Take 1 capsule (50 mg total) by mouth 2 (two) times daily as needed (for constipation). 04/22/16   [name], Alinda Sierras, MD  magic mouthwash w/lidocaine SOLN Take 5 mLs by mouth 4 (four) times daily as needed for mouth pain. 1:1 ratio. DIPHENHYDRAMINE HCL (ANTIHISTAMINES - ETHANOLAMINES),ALUM & MAG  HYDROXIDE-SIMETH,NYSTATIN (ANTI-INFECTIVES - THROAT),LIDOCAINE HCL (ANESTHETICS TOPICAL ORAL) 04/01/17   [place], Holli Humbles, NP  nitroGLYCERIN (NITROSTAT) 0.4 MG SL tablet Place 1 tablet (0.4 mg total) under the tongue every 5 (five) minutes as needed. Chest pain 01/20/14   Burnell Blanks, MD  polyethylene glycol [facility] / Floria Raveling) packet Take 17 g by mouth daily. 06/20/17   Doreatha Lew, MD  metoprolol (LOPRESSOR) 50 MG tablet  06/08/10 03/27/11  [provider]  spironolactone (ALDACTONE) 25 MG tablet  01/14/11 03/27/11  [provider]    Family History Family History  Problem Relation Age of Onset  . Alzheimer's disease Mother   . Heart disease Mother   . Heart disease Father   . Migraines Father   . Ulcers Father   . Prostate cancer Brother   . Coronary artery disease Unknown        Male 1st degree relative <50  . Coronary artery disease Unknown        male 1st degree relative <60  . Heart disease Sister   . Obesity Sister        Morbid  . Arthritis Sister   . Heart disease Brother   . Arthritis Brother   . Sleep apnea Son   . Obesity Son   . Migraines Daughter   . Thyroid disease Daughter     Social History Social History   Tobacco Use  . Smoking status: Former Smoker    Packs/day: 1.50    Years: 20.00    Pack years: 30.00    Types: Cigarettes    Last attempt to quit: 04/05/1978    Years since quitting: 39.2  . Smokeless tobacco: Never Used  Substance Use Topics  . Alcohol use: No  . Drug use: No     Allergies   Ace inhibitors; Codeine; and Penicillins   Review of Systems Review of Systems  Constitutional: Negative for appetite change and fatigue.  HENT: Negative for congestion, ear discharge and sinus pressure.   Eyes: Negative for discharge.  Respiratory: Negative for cough.   Cardiovascular: Negative for chest pain.  Gastrointestinal: Negative for abdominal pain and diarrhea.  Genitourinary: Positive for hematuria.  Negative for frequency.  Musculoskeletal: Negative for back pain.  Skin: Negative for rash.  Neurological: Negative for seizures and headaches.  Psychiatric/Behavioral: Negative for hallucinations.     Physical Exam Updated Vital Signs BP 125/68 (BP Location: Left Arm)   Pulse 83   Temp 98.5 F (  36.9 C) (Oral)   Resp 18   Ht 5\' 10"  (1.778 m)   Wt 101.2 kg (223 lb)   SpO2 97%   BMI 32.00 kg/m   Physical Exam  Constitutional: He is oriented to person, place, and time. He appears well-developed.  HENT:  Head: Normocephalic.  Eyes: Conjunctivae and EOM are normal. No scleral icterus.  Neck: Neck supple. No thyromegaly present.  Cardiovascular: Normal rate and regular rhythm. Exam reveals no gallop and no friction rub.  No murmur heard. Pulmonary/Chest: No stridor. He has no wheezes. He has no rales. He exhibits no tenderness.  Abdominal: He exhibits no distension. There is no tenderness. There is no rebound.  Genitourinary:  Genitourinary Comments: Patient is a Foley and there appears to be some blood in his bag  Musculoskeletal: Normal range of motion. He exhibits no edema.  Lymphadenopathy:    He has no cervical adenopathy.  Neurological: He is oriented to person, place, and time. He exhibits normal muscle tone. Coordination normal.  Skin: No rash noted. No erythema.  Psychiatric: He has a normal mood and affect. His behavior is normal.     ED Treatments / Results  Labs (all labs ordered are listed, but only abnormal results are displayed) Labs Reviewed  CBC WITH DIFFERENTIAL/PLATELET - Abnormal; Notable for the following components:      Result Value   RBC 3.29 (*)    Hemoglobin 9.9 (*)    HCT 31.1 (*)    RDW 16.2 (*)    Platelets 137 (*)    All other components within normal limits  COMPREHENSIVE METABOLIC PANEL - Abnormal; Notable for the following components:   Calcium 8.6 (*)    Total Protein 5.8 (*)    Albumin 2.7 (*)    ALT 7 (*)    All other components  within normal limits  PROTIME-INR - Abnormal; Notable for the following components:   Prothrombin Time 49.9 (*)    INR 5.54 (*)    All other components within normal limits  URINALYSIS, ROUTINE W REFLEX MICROSCOPIC - Abnormal; Notable for the following components:   Color, Urine RED (*)    APPearance TURBID (*)    Glucose, UA   (*)    Value: TEST NOT REPORTED DUE TO COLOR INTERFERENCE OF URINE PIGMENT   Hgb urine dipstick   (*)    Value: TEST NOT REPORTED DUE TO COLOR INTERFERENCE OF URINE PIGMENT   Bilirubin Urine   (*)    Value: TEST NOT REPORTED DUE TO COLOR INTERFERENCE OF URINE PIGMENT   Ketones, ur   (*)    Value: TEST NOT REPORTED DUE TO COLOR INTERFERENCE OF URINE PIGMENT   Protein, ur   (*)    Value: TEST NOT REPORTED DUE TO COLOR INTERFERENCE OF URINE PIGMENT   Nitrite   (*)    Value: TEST NOT REPORTED DUE TO COLOR INTERFERENCE OF URINE PIGMENT   Leukocytes, UA   (*)    Value: TEST NOT REPORTED DUE TO COLOR INTERFERENCE OF URINE PIGMENT   All other components within normal limits  URINALYSIS, MICROSCOPIC (REFLEX) - Abnormal; Notable for the following components:   Bacteria, UA MANY (*)    All other components within normal limits  URINE CULTURE    EKG None  Radiology No results found.  Procedures Procedures (including critical care time)  Medications Ordered in ED Medications  acetaminophen (TYLENOL) tablet 650 mg (650 mg Oral Given 06/29/17 0934)  cephALEXin (KEFLEX) capsule 500 mg (500 mg  Oral Given 06/29/17 1121)  heparin lock flush 100 unit/mL (500 Units Intracatheter Given 06/29/17 1122)     Initial Impression / Assessment and Plan / ED Course  I have reviewed the triage vital signs and the nursing notes.  Pertinent labs & imaging results that were available during my care of the patient were reviewed by me and considered in my medical decision making (see chart for details).   Patient with hematuria and possible UTI.  His PT is elevated.  Patient will  be started on Keflex and urine cultures done he is also going to decrease his warfarin tonight from 7.5 mg to 2.5 mg just for tonight and he will follow-up with urologist and his primary care doctor    Final Clinical Impressions(s) / ED Diagnoses   Final diagnoses:  None    ED Discharge Orders        Ordered    cephALEXin (KEFLEX) 500 MG capsule  4 times daily     06/29/17 1123       Milton Ferguson, MD 06/29/17 1133

## 2017-06-30 LAB — URINE CULTURE: CULTURE: NO GROWTH

## 2017-07-01 ENCOUNTER — Ambulatory Visit (INDEPENDENT_AMBULATORY_CARE_PROVIDER_SITE_OTHER): Payer: Medicare Other | Admitting: General Practice

## 2017-07-01 DIAGNOSIS — Z7901 Long term (current) use of anticoagulants: Secondary | ICD-10-CM | POA: Diagnosis not present

## 2017-07-01 DIAGNOSIS — I480 Paroxysmal atrial fibrillation: Secondary | ICD-10-CM

## 2017-07-01 LAB — POCT INR: INR: 4.3 — AB (ref 2.0–3.0)

## 2017-07-01 NOTE — Patient Instructions (Signed)
Pre visit review using our clinic review tool, if applicable. No additional management support is needed unless otherwise documented below in the visit note.  Hold coumadin today and tomorrow and then change dosage and take 1 1/2 tablets daily except 1 tablet on Mon/Wednesdays/Fridays. Re-check on Tuesday.  Dosing instructions given to Olivia Mackie, RN @ [facility] while in patient's home.[phone]

## 2017-07-08 ENCOUNTER — Ambulatory Visit (INDEPENDENT_AMBULATORY_CARE_PROVIDER_SITE_OTHER): Payer: Medicare Other | Admitting: General Practice

## 2017-07-08 DIAGNOSIS — Z7901 Long term (current) use of anticoagulants: Secondary | ICD-10-CM | POA: Diagnosis not present

## 2017-07-08 DIAGNOSIS — I480 Paroxysmal atrial fibrillation: Secondary | ICD-10-CM

## 2017-07-08 LAB — POCT INR: INR: 2.3 (ref 2.0–3.0)

## 2017-07-08 NOTE — Patient Instructions (Addendum)
Pre visit review using our clinic review tool, if applicable. No additional management support is needed unless otherwise documented below in the visit note.  Continue to take 1 1/2 tablets daily except 1 tablet on Mon/Wednesdays/Fridays. Re-check in 1 week.  Dosing instructions given to Olivia Mackie, RN @ [facility] while in patient's home.[phone]

## 2017-07-15 ENCOUNTER — Ambulatory Visit (INDEPENDENT_AMBULATORY_CARE_PROVIDER_SITE_OTHER): Payer: Medicare Other | Admitting: General Practice

## 2017-07-15 DIAGNOSIS — I480 Paroxysmal atrial fibrillation: Secondary | ICD-10-CM

## 2017-07-15 DIAGNOSIS — Z7901 Long term (current) use of anticoagulants: Secondary | ICD-10-CM | POA: Diagnosis not present

## 2017-07-15 LAB — POCT INR: INR: 2.8 (ref 2.0–3.0)

## 2017-07-15 NOTE — Patient Instructions (Signed)
Pre visit review using our clinic review tool, if applicable. No additional management support is needed unless otherwise documented below in the visit note.  Continue to take 1 1/2 tablets daily except 1 tablet on Mon/Wednesdays/Fridays. Re-check in 1 week.  Dosing instructions given to [place], RN @ [facility] while in patient's [phone].

## 2017-07-23 ENCOUNTER — Telehealth: Payer: Self-pay | Admitting: *Deleted

## 2017-07-23 NOTE — Telephone Encounter (Signed)
Will send to dr Elease Hashimoto as Juluis Rainier.  Nothing further needed.   Copied from [place] [phone]. Topic: General - Other >> Jul 23, 2017 10:46 AM Valla Leaver wrote: Reason for CRM: Dorian Pod, RN with [facility] notifying Dr. Elease Hashimoto that they are recertifying the patient today to continue to be seen for the next five weeks

## 2017-07-24 ENCOUNTER — Ambulatory Visit: Payer: Medicare Other | Admitting: Cardiology

## 2017-07-29 ENCOUNTER — Ambulatory Visit (INDEPENDENT_AMBULATORY_CARE_PROVIDER_SITE_OTHER): Payer: Medicare Other | Admitting: General Practice

## 2017-07-29 DIAGNOSIS — Z7901 Long term (current) use of anticoagulants: Secondary | ICD-10-CM

## 2017-07-29 DIAGNOSIS — I480 Paroxysmal atrial fibrillation: Secondary | ICD-10-CM

## 2017-07-29 LAB — POCT INR: INR: 3.5 — AB (ref 2.0–3.0)

## 2017-07-29 NOTE — Patient Instructions (Addendum)
Pre visit review using our clinic review tool, if applicable. No additional management support is needed unless otherwise documented below in the visit note.  Continue to take 1 1/2 tablets daily except 1 tablet on Mon/Wednesdays/Fridays. Re-check in 1 week.  Dosing instructions given to Olivia Mackie, RN @ [facility] while in patient's home.[phone].

## 2017-08-04 ENCOUNTER — Telehealth: Payer: Self-pay | Admitting: Family Medicine

## 2017-08-04 NOTE — Telephone Encounter (Signed)
Copied from [place] [phone]. Topic: Quick Communication - See Telephone Encounter >> Aug 04, 2017  2:48 PM Antonieta Iba C wrote: CRM for notification. See Telephone encounter for: 08/04/17.   Pt called in to be advised. Pt says that he is suppose to have a procedure at [facility], pt says that to his understanding someone is suppose to get back with him to advise on when he is suppose to stop his coumadin. Pt says that he has not heard anything. Please advise.    CB: [phone]

## 2017-08-04 NOTE — Telephone Encounter (Signed)
Just spoke with patient.  Asked him to call Villa Herb, RN @ [phone] when he gets a date for the procedure.  At that time I will give him instructions for the coumadin and the Lovenox.  Patient verbalized understanding.

## 2017-08-05 ENCOUNTER — Ambulatory Visit (INDEPENDENT_AMBULATORY_CARE_PROVIDER_SITE_OTHER): Payer: Medicare Other | Admitting: General Practice

## 2017-08-05 ENCOUNTER — Other Ambulatory Visit: Payer: Self-pay | Admitting: Urology

## 2017-08-05 ENCOUNTER — Encounter (HOSPITAL_COMMUNITY): Payer: Self-pay | Admitting: *Deleted

## 2017-08-05 DIAGNOSIS — I480 Paroxysmal atrial fibrillation: Secondary | ICD-10-CM

## 2017-08-05 DIAGNOSIS — Z7901 Long term (current) use of anticoagulants: Secondary | ICD-10-CM | POA: Diagnosis not present

## 2017-08-05 LAB — POCT INR: INR: 1.6 — AB (ref 2.0–3.0)

## 2017-08-05 NOTE — Patient Instructions (Signed)
Pre visit review using our clinic review tool, if applicable. No additional management support is needed unless otherwise documented below in the visit note.  Take 2 tablets today (7/3) and continue to take 1 1/2 tablets daily except 1 tablet on Mon/Wednesdays/Fridays. Re-check in 1 week.  Dosing instructions given to Olivia Mackie, RN @ [facility] while in patient's home.[phone].

## 2017-08-08 ENCOUNTER — Telehealth: Payer: Self-pay | Admitting: General Practice

## 2017-08-08 ENCOUNTER — Other Ambulatory Visit: Payer: Self-pay

## 2017-08-08 ENCOUNTER — Other Ambulatory Visit: Payer: Self-pay | Admitting: Family

## 2017-08-08 ENCOUNTER — Inpatient Hospital Stay: Payer: Medicare Other

## 2017-08-08 ENCOUNTER — Encounter: Payer: Self-pay | Admitting: Hematology & Oncology

## 2017-08-08 ENCOUNTER — Inpatient Hospital Stay: Payer: Medicare Other | Attending: Hematology & Oncology

## 2017-08-08 ENCOUNTER — Other Ambulatory Visit: Payer: Self-pay | Admitting: General Practice

## 2017-08-08 ENCOUNTER — Inpatient Hospital Stay (HOSPITAL_BASED_OUTPATIENT_CLINIC_OR_DEPARTMENT_OTHER): Payer: Medicare Other | Admitting: Hematology & Oncology

## 2017-08-08 VITALS — BP 130/65 | HR 81 | Temp 98.1°F | Wt 212.0 lb

## 2017-08-08 VITALS — BP 126/54 | HR 86 | Temp 97.9°F | Resp 20

## 2017-08-08 DIAGNOSIS — D500 Iron deficiency anemia secondary to blood loss (chronic): Secondary | ICD-10-CM | POA: Insufficient documentation

## 2017-08-08 DIAGNOSIS — N400 Enlarged prostate without lower urinary tract symptoms: Secondary | ICD-10-CM | POA: Diagnosis not present

## 2017-08-08 DIAGNOSIS — C8589 Other specified types of non-Hodgkin lymphoma, extranodal and solid organ sites: Secondary | ICD-10-CM | POA: Diagnosis not present

## 2017-08-08 DIAGNOSIS — Z79899 Other long term (current) drug therapy: Secondary | ICD-10-CM

## 2017-08-08 DIAGNOSIS — D696 Thrombocytopenia, unspecified: Secondary | ICD-10-CM

## 2017-08-08 DIAGNOSIS — Z923 Personal history of irradiation: Secondary | ICD-10-CM | POA: Diagnosis not present

## 2017-08-08 DIAGNOSIS — N289 Disorder of kidney and ureter, unspecified: Secondary | ICD-10-CM | POA: Insufficient documentation

## 2017-08-08 DIAGNOSIS — D631 Anemia in chronic kidney disease: Secondary | ICD-10-CM

## 2017-08-08 DIAGNOSIS — Z9221 Personal history of antineoplastic chemotherapy: Secondary | ICD-10-CM

## 2017-08-08 DIAGNOSIS — G8929 Other chronic pain: Secondary | ICD-10-CM | POA: Insufficient documentation

## 2017-08-08 DIAGNOSIS — N320 Bladder-neck obstruction: Secondary | ICD-10-CM | POA: Insufficient documentation

## 2017-08-08 DIAGNOSIS — M549 Dorsalgia, unspecified: Secondary | ICD-10-CM | POA: Diagnosis not present

## 2017-08-08 DIAGNOSIS — H538 Other visual disturbances: Secondary | ICD-10-CM

## 2017-08-08 DIAGNOSIS — Z7984 Long term (current) use of oral hypoglycemic drugs: Secondary | ICD-10-CM | POA: Insufficient documentation

## 2017-08-08 DIAGNOSIS — R600 Localized edema: Secondary | ICD-10-CM | POA: Insufficient documentation

## 2017-08-08 DIAGNOSIS — Z7901 Long term (current) use of anticoagulants: Secondary | ICD-10-CM | POA: Diagnosis not present

## 2017-08-08 DIAGNOSIS — N184 Chronic kidney disease, stage 4 (severe): Secondary | ICD-10-CM

## 2017-08-08 HISTORY — DX: Iron deficiency anemia secondary to blood loss (chronic): D50.0

## 2017-08-08 LAB — CBC WITH DIFFERENTIAL (CANCER CENTER ONLY)
Basophils Absolute: 0 10*3/uL (ref 0.0–0.1)
Basophils Relative: 1 %
EOS ABS: 0.1 10*3/uL (ref 0.0–0.5)
EOS PCT: 3 %
HCT: 28.7 % — ABNORMAL LOW (ref 38.7–49.9)
Hemoglobin: 9.1 g/dL — ABNORMAL LOW (ref 13.0–17.1)
Lymphocytes Relative: 27 %
Lymphs Abs: 0.9 10*3/uL (ref 0.9–3.3)
MCH: 31.3 pg (ref 28.0–33.4)
MCHC: 31.7 g/dL — AB (ref 32.0–35.9)
MCV: 98.6 fL — ABNORMAL HIGH (ref 82.0–98.0)
Monocytes Absolute: 0.5 10*3/uL (ref 0.1–0.9)
Monocytes Relative: 17 %
Neutro Abs: 1.7 10*3/uL (ref 1.5–6.5)
Neutrophils Relative %: 52 %
PLATELETS: 90 10*3/uL — AB (ref 145–400)
RBC: 2.91 MIL/uL — ABNORMAL LOW (ref 4.20–5.70)
RDW: 16.9 % — ABNORMAL HIGH (ref 11.1–15.7)
WBC Count: 3.3 10*3/uL — ABNORMAL LOW (ref 4.0–10.0)

## 2017-08-08 LAB — RETICULOCYTES
RBC.: 2.93 MIL/uL — ABNORMAL LOW (ref 4.20–5.82)
Retic Count, Absolute: 55.7 10*3/uL (ref 34.8–93.9)
Retic Ct Pct: 1.9 % — ABNORMAL HIGH (ref 0.8–1.8)

## 2017-08-08 LAB — CMP (CANCER CENTER ONLY)
ALT: 15 U/L (ref 10–47)
AST: 19 U/L (ref 11–38)
Albumin: 3.2 g/dL — ABNORMAL LOW (ref 3.5–5.0)
Alkaline Phosphatase: 70 U/L (ref 26–84)
Anion gap: 8 (ref 5–15)
BUN: 14 mg/dL (ref 7–22)
CHLORIDE: 106 mmol/L (ref 98–108)
CO2: 25 mmol/L (ref 18–33)
Calcium: 9.2 mg/dL (ref 8.0–10.3)
Creatinine: 1 mg/dL (ref 0.60–1.20)
GLUCOSE: 116 mg/dL (ref 73–118)
POTASSIUM: 4.1 mmol/L (ref 3.3–4.7)
SODIUM: 139 mmol/L (ref 128–145)
Total Bilirubin: 0.6 mg/dL (ref 0.2–1.6)
Total Protein: 6.2 g/dL — ABNORMAL LOW (ref 6.4–8.1)

## 2017-08-08 LAB — IRON AND TIBC
IRON: 47 ug/dL (ref 42–163)
SATURATION RATIOS: 22 % — AB (ref 42–163)
TIBC: 210 ug/dL (ref 202–409)
UIBC: 163 ug/dL

## 2017-08-08 LAB — FERRITIN: FERRITIN: 491 ng/mL — AB (ref 24–336)

## 2017-08-08 MED ORDER — SODIUM CHLORIDE 0.9 % IV SOLN
510.0000 mg | Freq: Once | INTRAVENOUS | Status: AC
  Administered 2017-08-08: 510 mg via INTRAVENOUS
  Filled 2017-08-08: qty 17

## 2017-08-08 MED ORDER — SODIUM CHLORIDE 0.9 % IV SOLN
Freq: Once | INTRAVENOUS | Status: AC
  Administered 2017-08-08: 09:00:00 via INTRAVENOUS
  Filled 2017-08-08: qty 250

## 2017-08-08 MED ORDER — HEPARIN SOD (PORK) LOCK FLUSH 100 UNIT/ML IV SOLN
500.0000 [IU] | Freq: Once | INTRAVENOUS | Status: AC | PRN
  Administered 2017-08-08: 500 [IU]
  Filled 2017-08-08: qty 5

## 2017-08-08 MED ORDER — DARBEPOETIN ALFA 300 MCG/0.6ML IJ SOSY: 300.0000 ug | PREFILLED_SYRINGE | Freq: Once | INTRAMUSCULAR | Status: DC

## 2017-08-08 MED ORDER — SODIUM CHLORIDE 0.9% FLUSH
10.0000 mL | Freq: Once | INTRAVENOUS | Status: AC | PRN
  Administered 2017-08-08: 10 mL
  Filled 2017-08-08: qty 10

## 2017-08-08 MED ORDER — DARBEPOETIN ALFA 300 MCG/0.6ML IJ SOSY
PREFILLED_SYRINGE | INTRAMUSCULAR | Status: AC
  Filled 2017-08-08: qty 0.6

## 2017-08-08 MED ORDER — ENOXAPARIN SODIUM 150 MG/ML ~~LOC~~ SOLN: 150.0000 mg | SUBCUTANEOUS | 0 refills | Status: DC

## 2017-08-08 NOTE — Patient Instructions (Signed)
Implanted Port Home Guide An implanted port is a type of central line that is placed under the skin. Central lines are used to provide IV access when treatment or nutrition needs to be given through a person's veins. Implanted ports are used for long-term IV access. An implanted port may be placed because:  You need IV medicine that would be irritating to the small veins in your hands or arms.  You need long-term IV medicines, such as antibiotics.  You need IV nutrition for a long period.  You need frequent blood draws for lab tests.  You need dialysis.  Implanted ports are usually placed in the chest area, but they can also be placed in the upper arm, the abdomen, or the leg. An implanted port has two main parts:  Reservoir. The reservoir is round and will appear as a small, raised area under your skin. The reservoir is the part where a needle is inserted to give medicines or draw blood.  Catheter. The catheter is a thin, flexible tube that extends from the reservoir. The catheter is placed into a large vein. Medicine that is inserted into the reservoir goes into the catheter and then into the vein.  How will I care for my incision site? Do not get the incision site wet. Bathe or shower as directed by your health care provider. How is my port accessed? Special steps must be taken to access the port:  Before the port is accessed, a numbing cream can be placed on the skin. This helps numb the skin over the port site.  Your health care provider uses a sterile technique to access the port. ? Your health care provider must put on a mask and sterile gloves. ? The skin over your port is cleaned carefully with an antiseptic and allowed to dry. ? The port is gently pinched between sterile gloves, and a needle is inserted into the port.  Only "non-coring" port needles should be used to access the port. Once the port is accessed, a blood return should be checked. This helps ensure that the port  is in the vein and is not clogged.  If your port needs to remain accessed for a constant infusion, a clear (transparent) bandage will be placed over the needle site. The bandage and needle will need to be changed every week, or as directed by your health care provider.  Keep the bandage covering the needle clean and dry. Do not get it wet. Follow your health care provider's instructions on how to take a shower or bath while the port is accessed.  If your port does not need to stay accessed, no bandage is needed over the port.  What is flushing? Flushing helps keep the port from getting clogged. Follow your health care provider's instructions on how and when to flush the port. Ports are usually flushed with saline solution or a medicine called heparin. The need for flushing will depend on how the port is used.  If the port is used for intermittent medicines or blood draws, the port will need to be flushed: ? After medicines have been given. ? After blood has been drawn. ? As part of routine maintenance.  If a constant infusion is running, the port may not need to be flushed.  How long will my port stay implanted? The port can stay in for as long as your health care provider thinks it is needed. When it is time for the port to come out, surgery will be   done to remove it. The procedure is similar to the one performed when the port was put in. When should I seek immediate medical care? When you have an implanted port, you should seek immediate medical care if:  You notice a bad smell coming from the incision site.  You have swelling, redness, or drainage at the incision site.  You have more swelling or pain at the port site or the surrounding area.  You have a fever that is not controlled with medicine.  This information is not intended to replace advice given to you by your health care provider. Make sure you discuss any questions you have with your health care provider. Document  Released: 12/24/2004 Document Revised: 06/01/2015 Document Reviewed: 08/31/2012 [employer] Interactive Patient Education  2017 [employer]  

## 2017-08-08 NOTE — Progress Notes (Signed)
Hematology and Oncology Follow Up Visit  Gary Yates [id number] 1933-04-23 84 y.o. 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  He really is bothered by the indwelling catheter that he has for his bladder outlet obstruction.  He does have some prostate enlargement.  However, it appears to be the central portion of the prostate that is enlarged.  He says he is going to have a procedure on August 12 and which what sounds like a suprapubic catheter is going to be placed.  He is on Coumadin.  He has had no obvious bleeding.  He does have iron deficiency.  We saw him last, his ferritin was 249 with an iron saturation of 20%.  His hemoglobin is 9.1 today.  I think that we probably need to give him a dose of IV iron.  His erythropoietin level is 38.  As such, we might have to use Aranesp in the future.  I do not see any obvious issues with respect to the lymphoma recurring right now.  He has chronic back issues.  He has chronic cardiac issues.    Overall, his performance status is ECOG 2.  Medications:  Current Outpatient Medications:  .  ACCU-CHEK AVIVA PLUS test strip, TEST 3 TIMES A DAY (Patient taking differently: TEST 2 TIMES A DAY), Disp: 300 each, Rfl: 5 .  acetaminophen (TYLENOL) 500 MG tablet, Take 1,000 mg by mouth every 6 (six) hours as needed (for pain/fever/headaches.). , Disp: , Rfl:  .  atorvastatin (LIPITOR) 40 MG tablet, TAKE 1 TABLET BY MOUTH EVERY DAY, Disp: 90  tablet, Rfl: 0 .  cephALEXin (KEFLEX) 500 MG capsule, Take 1 capsule (500 mg total) by mouth 4 (four) times daily., Disp: 28 capsule, Rfl: 0 .  darifenacin (ENABLEX) 7.5 MG 24 hr tablet, Take 1 tablet (7.5 mg total) by mouth daily., Disp: 30 tablet, Rfl: 0 .  docusate sodium (COLACE) 50 MG capsule, Take 1 capsule (50 mg total) by mouth 2 (two) times daily as needed (for constipation)., Disp: 90 capsule, Rfl: 0 .  gabapentin (NEURONTIN) 300 MG capsule, TAKE 2 CAPSULES BY MOUTH 3 TIMES A DAY, Disp: 540 capsule, Rfl: 2 .  KLOR-CON M20 20 MEQ tablet, TAKE 1 TABLET BY MOUTH EVERY DAY, Disp: 90 tablet, Rfl: 0 .  magic mouthwash w/lidocaine SOLN, Take 5 mLs by mouth 4 (four) times daily as needed for mouth pain. 1:1 ratio. DIPHENHYDRAMINE HCL (ANTIHISTAMINES - ETHANOLAMINES),ALUM & MAG HYDROXIDE-SIMETH,NYSTATIN (ANTI-INFECTIVES - THROAT),LIDOCAINE HCL (ANESTHETICS TOPICAL ORAL), Disp: 600 mL, Rfl: 3 .  Melatonin 1 MG TABS, Take 1 tablet by mouth at bedtime. , Disp: , Rfl:  .  metFORMIN (GLUCOPHAGE) 500 MG tablet, TAKE 1 TABLET BY MOUTH TWICE A DAY WITH A MEAL, Disp: 180 tablet, Rfl: 2 .  metoprolol succinate (TOPROL-XL) 100 MG 24 hr tablet, TAKE 1 TABLET BY MOUTH EVERY DAY IMMEDIATELY FOLLOWING A  MEAL, Disp: 90 tablet, Rfl: 1 .  nitroGLYCERIN (NITROSTAT) 0.4 MG SL tablet, Place 1 tablet (0.4 mg total) under the tongue every 5 (five) minutes as needed. Chest pain, Disp: 25 tablet, Rfl: 6 .  omega-3 acid ethyl esters (LOVAZA) 1 g capsule, Take 1 g by mouth daily., Disp: , Rfl:  .  polyethylene glycol (MIRALAX / GLYCOLAX) packet, Take 17 g by mouth daily., Disp: 14 each, Rfl: 0 .  pramipexole (MIRAPEX) 1.5 MG tablet, Take two tablets every night., Disp: 180 tablet, Rfl: 3 .  tamsulosin (FLOMAX) 0.4 MG CAPS capsule, Take 0.4 mg by mouth at bedtime., Disp: , Rfl:  .  TOUJEO SOLOSTAR 300 UNIT/ML SOPN, INJECT 20 UNITS INTO THE SKIN DAILY AFTER BREAKFAST., Disp: 15 mL, Rfl: 1 .  vitamin B-12 (CYANOCOBALAMIN) 1000  MCG tablet, Take 1 tablet (1,000 mcg total) by mouth daily., Disp: 30 tablet, Rfl: 0 .  warfarin (COUMADIN) 5 MG tablet, Take as directed by anticoagulation clinic (Patient taking differently: Take 5-7.5 mg by mouth as directed. 7.5 MG on (Sunday, Monday, Tuesday, Thursday, Friday and Saturday) 5 MG on Wednesday), Disp: 45 tablet, Rfl: 3 No current facility-administered medications for this visit.   Facility-Administered Medications Ordered in Other Visits:  .  acetaminophen (TYLENOL) tablet 650 mg, 650 mg, Oral, Once, [place], Sarah M, NP .  pegfilgrastim (NEULASTA ONPRO KIT) injection 6 mg, 6 mg, Subcutaneous, Once, [place], Peter R, MD .  sodium chloride flush (NS) 0.9 % injection 10 mL, 10 mL, Intracatheter, PRN, Volanda Napoleon, MD, 10 mL at 02/27/17 1503 .  sodium chloride flush (NS) 0.9 % injection 10 mL, 10 mL, Intravenous, PRN, Volanda Napoleon, MD, 10 mL at 05/07/17 2202  Allergies:  Allergies  Allergen Reactions  . Ace Inhibitors Other (See Comments)    cough  . Codeine Nausea Only and Rash       . Penicillins Rash    Childhood allergy Has patient had a PCN reaction causing immediate rash, facial/tongue/throat swelling, SOB or lightheadedness with hypotension: Yes Has patient had a PCN reaction causing severe rash involving mucus membranes or skin necrosis: Yes Has patient had a PCN reaction that required hospitalization No Has patient had a PCN reaction occurring within the last 10 years: No If all of the above answers are "NO", then may proceed with Cephalosporin use.     Past Medical History, Surgical history, Social history, and Family History were reviewed and updated.  Review of Systems: Review of Systems  HENT: Positive for sinus pain and sore throat.   Eyes: Positive for blurred vision.  Respiratory: Positive for wheezing.   Cardiovascular: Positive for leg swelling.  Gastrointestinal: Negative.   Genitourinary: Negative.   Musculoskeletal: Positive for  back pain.  Skin: Negative.   Neurological: Positive for weakness.  Endo/Heme/Allergies: Negative.   Psychiatric/Behavioral: Negative.     Physical Exam:  weight is 212 lb (96.2 kg). His oral temperature is 98.1 F (36.7 C). His blood pressure is 130/65 and his pulse is 81. His respiration is 18.   Wt Readings from Last 3 Encounters:  08/08/17 212 lb (96.2 kg)  08/08/17 212 lb (96.2 kg)  06/29/17 223 lb (101.2 kg)      Physical Exam  Constitutional: He is oriented to person, place, and time.  HENT:  Head: Normocephalic and atraumatic.  Mouth/Throat: Oropharynx is clear and moist.  Eyes: Pupils are equal, round, and reactive to light. EOM are normal.  Neck: Normal range of motion.  Cardiovascular: Normal  rate, regular rhythm and normal heart sounds.  Pulmonary/Chest: Effort normal and breath sounds normal.  Abdominal: Soft. Bowel sounds are normal.  Musculoskeletal: Normal range of motion. He exhibits no edema, tenderness or deformity.  Lymphadenopathy:    He has no cervical adenopathy.  Neurological: He is alert and oriented to person, place, and time.  Skin: Skin is warm and dry. No rash noted. No erythema.  Psychiatric: He has a normal mood and affect. His behavior is normal. Judgment and thought content normal.  Vitals reviewed.    Lab Results  Component Value Date   WBC 3.3 (L) 08/08/2017   HGB 9.1 (L) 08/08/2017   HCT 28.7 (L) 08/08/2017   MCV 98.6 (H) 08/08/2017   PLT 90 (L) 08/08/2017     Chemistry      Component Value Date/Time   NA 138 06/29/2017 0832   NA 141 01/10/2017 1136   NA 137 12/14/2015 1100   K 4.1 06/29/2017 0832   K 4.4 01/10/2017 1136   K 3.7 12/14/2015 1100   CL 107 06/29/2017 0832   CL 104 01/10/2017 1136   CO2 25 06/29/2017 0832   CO2 27 01/10/2017 1136   CO2 24 12/14/2015 1100   BUN 13 06/29/2017 0832   BUN 19 01/10/2017 1136   BUN 18.4 12/14/2015 1100   CREATININE 0.92 06/29/2017 0832   CREATININE 1.40 (H) 05/07/2017 0830    CREATININE 1.1 01/10/2017 1136   CREATININE 0.9 12/14/2015 1100      Component Value Date/Time   CALCIUM 8.6 (L) 06/29/2017 0832   CALCIUM 9.4 01/10/2017 1136   CALCIUM 9.5 12/14/2015 1100   ALKPHOS 81 06/29/2017 0832   ALKPHOS 112 (H) 01/10/2017 1136   ALKPHOS 102 12/14/2015 1100   AST 16 06/29/2017 0832   AST 24 05/07/2017 0830   AST 22 12/14/2015 1100   ALT 7 (L) 06/29/2017 0832   ALT 16 05/07/2017 0830   ALT 18 01/10/2017 1136   ALT 10 12/14/2015 1100   BILITOT 0.4 06/29/2017 0832   BILITOT 0.6 05/07/2017 0830   BILITOT 0.58 12/14/2015 1100      Impression and Plan: Mr. Both is an 84 year old white male. He has relapsed large cell non-Hodgkin lymphoma of the testicle. He was on salvage chemotherapy with Rituxan/bendamustine/Velcade.  He did not really respond well to this.  We then treated him with dose reduced R-ICE.  He responded very well to this.  His last PET scan showed that he was in remission.    For right now, we will see how he does with iron.  I know he has some thrombocytopenia.  I do not think this should be a real problem for when he has this suprapubic catheter placed.  I would like to see him back in 6 weeks.  We will see how his blood count is.     Volanda Napoleon, MD 8/2/20198:35 AM

## 2017-08-08 NOTE — Telephone Encounter (Signed)
-----   Message from [name], MD sent at 08/03/2017  8:35 PM EDT ----- Jenny Reichmann,  Mr Dunn is in process of being set up by urology for Cystoscopy and supra-pubic tube placement.  I feel it would be best for him to be bridged with Lovenox.  Would you be able to assist with this?  Thanks.  Dr Jacinto Reap

## 2017-08-08 NOTE — Patient Instructions (Signed)

## 2017-08-08 NOTE — Telephone Encounter (Signed)
Instructions for coumadin and Lovenox pre and post procedure on 8/12.  See instructions below.  8/7 - Last dose of coumadin until after procedure. 8/8 - Nothing (No coumadin and No Lovenox) 8/9 - Lovenox in the AM 8/10 - Lovenox in the AM 8/11 - Lovenox in the AM (Take by 7 am) 8/12 - Procedure (DO NOT TAKE LOVENOX TODAY) 8/13 - Lovenox in the AM and 2 tablets of coumadin 8/14 - Lovenox in the AM and 2 tablets of coumadin 8/15 - Lovenox in the AM and 1 1/2 tablets of coumadin 8/16 - Lovenox in the AM and 1 1/2 tablets of coumadin 8/17 - Stop Lovenox and continue to take coumadin, 1 1/2 tablets daily except 1 tablet on Mon/Wed/Friday.  8/19 - Re-check INR  Home Health RN with [facility] will be seeing patient during these dates.

## 2017-08-11 ENCOUNTER — Ambulatory Visit (INDEPENDENT_AMBULATORY_CARE_PROVIDER_SITE_OTHER): Payer: Medicare Other | Admitting: General Practice

## 2017-08-11 DIAGNOSIS — Z7901 Long term (current) use of anticoagulants: Secondary | ICD-10-CM

## 2017-08-11 DIAGNOSIS — I480 Paroxysmal atrial fibrillation: Secondary | ICD-10-CM

## 2017-08-11 LAB — POCT INR: INR: 2.5 (ref 2.0–3.0)

## 2017-08-11 NOTE — Patient Instructions (Signed)
Pre visit review using our clinic review tool, if applicable. No additional management support is needed unless otherwise documented below in the visit note.    Instructions for coumadin and Lovenox pre and post procedure on 8/12.  See instructions below.  8/7 - Last dose of coumadin until after procedure. 8/8 - Nothing (No coumadin and No Lovenox) 8/9 - Lovenox in the AM 8/10 - Lovenox in the AM 8/11 - Lovenox in the AM (Take by 7 am) 8/12 - Procedure (DO NOT TAKE LOVENOX TODAY) 8/13 - Lovenox in the AM and 2 tablets of coumadin 8/14 - Lovenox in the AM and 2 tablets of coumadin 8/15 - Lovenox in the AM and 1 1/2 tablets of coumadin 8/16 - Lovenox in the AM and 1 1/2 tablets of coumadin 8/17 - Stop Lovenox and continue to take coumadin, 1 1/2 tablets daily except 1 tablet on Mon/Wed/Friday.  8/19 - Re-check INR

## 2017-08-13 ENCOUNTER — Encounter (HOSPITAL_COMMUNITY): Payer: Self-pay

## 2017-08-13 NOTE — Progress Notes (Signed)
INR 08-11-17 Epic  CBCDIFF, CMP 08-08-17 Epic    EKG 06-16-17 Epic   ECHO 06-10-17 Epic   CXR 06-05-17 Epic

## 2017-08-13 NOTE — Patient Instructions (Signed)
Jonathan Corpus.  08/13/2017   Your procedure is scheduled on: 08-18-17   Report to [facility] Main  Entrance    Report to admitting at 7:30AM    Call this number if you have problems the morning of surgery [phone]     Remember: Do not eat food or drink liquids :After Midnight.     Take these medicines the morning of surgery with A SIP OF WATER: METOPROLOL, ATORVASTATIN, ENABLEX, PANTOPRAZOLE, TYLENOL IF NEEDED                                You may not have any metal on your body including hair pins and              piercings  Do not wear jewelry, make-up, lotions, powders or perfumes, deodorant                       Men may shave face and neck.   Do not bring valuables to the hospital. [place].  Contacts, dentures or bridgework may not be worn into surgery.  Leave suitcase in the car. After surgery it may be brought to your room.                  Please read over the following fact sheets you were given: _____________________________________________________________________             How to Manage Your Diabetes Before and After Surgery  Why is it important to control my blood sugar before and after surgery? . Improving blood sugar levels before and after surgery helps healing and can limit problems. . A way of improving blood sugar control is eating a healthy diet by: o  Eating less sugar and carbohydrates o  Increasing activity/exercise o  Talking with your doctor about reaching your blood sugar goals . High blood sugars (greater than 180 mg/dL) can raise your risk of infections and slow your recovery, so you will need to focus on controlling your diabetes during the weeks before surgery. . Make sure that the doctor who takes care of your diabetes knows about your planned surgery including the date and location.  How do I manage my blood sugar before surgery? . Check your blood sugar  at least 4 times a day, starting 2 days before surgery, to make sure that the level is not too high or low. o Check your blood sugar the morning of your surgery when you wake up and every 2 hours until you get to the Short Stay unit. . If your blood sugar is less than 70 mg/dL, you will need to treat for low blood sugar: o Do not take insulin. o Treat a low blood sugar (less than 70 mg/dL) with  cup of clear juice (cranberry or apple), 4 glucose tablets, OR glucose gel. o Recheck blood sugar in 15 minutes after treatment (to make sure it is greater than 70 mg/dL). If your blood sugar is not greater than 70 mg/dL on recheck, call [phone] for further instructions. . Report your blood sugar to the short stay nurse when you get to Short Stay.  . If you are admitted to the hospital after surgery: o Your blood  sugar will be checked by the staff and you will probably be given insulin after surgery (instead of oral diabetes medicines) to make sure you have good blood sugar levels. o The goal for blood sugar control after surgery is 80-180 mg/dL.   WHAT DO I DO ABOUT MY DIABETES MEDICATION?   . THE DAY BEFORE SURGERY . TAKE METFORMIN AND TOUJEO INSULIN NORMALLY      . THE MORNING OF SURGERY o Do not take oral diabetes medicines (pills) ! o ONLY TAKE HALF DOSE OF TOUJEO INSULIN    Patient Signature:  Date:   Nurse Signature:  Date:   Reviewed and Endorsed by [facility] Patient Education Committee, August 2015    [facility] - Preparing for Surgery Before surgery, you can play an important role.  Because skin is not sterile, your skin needs to be as free of germs as possible.  You can reduce the number of germs on your skin by washing with CHG (chlorahexidine gluconate) soap before surgery.  CHG is an antiseptic cleaner which kills germs and bonds with the skin to continue killing germs even after washing. Please DO NOT use if you have an allergy to CHG or antibacterial soaps.  If  your skin becomes reddened/irritated stop using the CHG and inform your nurse when you arrive at Short Stay. Do not shave (including legs and underarms) for at least 48 hours prior to the first CHG shower.  You may shave your face/neck. Please follow these instructions carefully:  1.  Shower with CHG Soap the night before surgery and the  morning of Surgery.  2.  If you choose to wash your hair, wash your hair first as usual with your  normal  shampoo.  3.  After you shampoo, rinse your hair and body thoroughly to remove the  shampoo.                           4.  Use CHG as you would any other liquid soap.  You can apply chg directly  to the skin and wash                       Gently with a scrungie or clean washcloth.  5.  Apply the CHG Soap to your body ONLY FROM THE NECK DOWN.   Do not use on face/ open                           Wound or open sores. Avoid contact with eyes, ears mouth and genitals (private parts).                       Wash face,  Genitals (private parts) with your normal soap.             6.  Wash thoroughly, paying special attention to the area where your surgery  will be performed.  7.  Thoroughly rinse your body with warm water from the neck down.  8.  DO NOT shower/wash with your normal soap after using and rinsing off  the CHG Soap.                9.  Pat yourself dry with a clean towel.            10.  Wear clean pajamas.  11.  Place clean sheets on your bed the night of your first shower and do not  sleep with pets. Day of Surgery : Do not apply any lotions/deodorants the morning of surgery.  Please wear clean clothes to the hospital/surgery center.  FAILURE TO FOLLOW THESE INSTRUCTIONS MAY RESULT IN THE CANCELLATION OF YOUR SURGERY PATIENT SIGNATURE_________________________________  NURSE SIGNATURE__________________________________  ________________________________________________________________________

## 2017-08-14 ENCOUNTER — Other Ambulatory Visit: Payer: Medicare Other

## 2017-08-14 ENCOUNTER — Ambulatory Visit: Payer: Medicare Other | Admitting: Hematology & Oncology

## 2017-08-15 ENCOUNTER — Encounter (HOSPITAL_COMMUNITY): Payer: Self-pay

## 2017-08-15 ENCOUNTER — Other Ambulatory Visit: Payer: Self-pay

## 2017-08-15 ENCOUNTER — Encounter (HOSPITAL_COMMUNITY)
Admission: RE | Admit: 2017-08-15 | Discharge: 2017-08-15 | Disposition: A | Discharge status: Home / Self Care | Payer: Medicare Other | Source: Ambulatory Visit | Attending: Urology | Admitting: Urology

## 2017-08-15 DIAGNOSIS — N312 Flaccid neuropathic bladder, not elsewhere classified: Secondary | ICD-10-CM | POA: Diagnosis not present

## 2017-08-15 DIAGNOSIS — I4891 Unspecified atrial fibrillation: Secondary | ICD-10-CM | POA: Diagnosis not present

## 2017-08-15 DIAGNOSIS — Z87442 Personal history of urinary calculi: Secondary | ICD-10-CM | POA: Diagnosis not present

## 2017-08-15 DIAGNOSIS — Z885 Allergy status to narcotic agent status: Secondary | ICD-10-CM | POA: Diagnosis not present

## 2017-08-15 DIAGNOSIS — Z8249 Family history of ischemic heart disease and other diseases of the circulatory system: Secondary | ICD-10-CM | POA: Diagnosis not present

## 2017-08-15 DIAGNOSIS — G4730 Sleep apnea, unspecified: Secondary | ICD-10-CM | POA: Diagnosis not present

## 2017-08-15 DIAGNOSIS — I10 Essential (primary) hypertension: Secondary | ICD-10-CM | POA: Diagnosis not present

## 2017-08-15 DIAGNOSIS — E119 Type 2 diabetes mellitus without complications: Secondary | ICD-10-CM | POA: Diagnosis not present

## 2017-08-15 DIAGNOSIS — Z955 Presence of coronary angioplasty implant and graft: Secondary | ICD-10-CM | POA: Diagnosis not present

## 2017-08-15 DIAGNOSIS — Z79899 Other long term (current) drug therapy: Secondary | ICD-10-CM | POA: Diagnosis not present

## 2017-08-15 DIAGNOSIS — Z9049 Acquired absence of other specified parts of digestive tract: Secondary | ICD-10-CM | POA: Diagnosis not present

## 2017-08-15 DIAGNOSIS — Z87891 Personal history of nicotine dependence: Secondary | ICD-10-CM | POA: Diagnosis not present

## 2017-08-15 DIAGNOSIS — D649 Anemia, unspecified: Secondary | ICD-10-CM | POA: Diagnosis not present

## 2017-08-15 DIAGNOSIS — R339 Retention of urine, unspecified: Secondary | ICD-10-CM | POA: Diagnosis present

## 2017-08-15 DIAGNOSIS — N320 Bladder-neck obstruction: Secondary | ICD-10-CM | POA: Diagnosis not present

## 2017-08-15 DIAGNOSIS — R338 Other retention of urine: Secondary | ICD-10-CM | POA: Diagnosis not present

## 2017-08-15 DIAGNOSIS — Z7901 Long term (current) use of anticoagulants: Secondary | ICD-10-CM | POA: Diagnosis not present

## 2017-08-15 DIAGNOSIS — Z880 Allergy status to penicillin: Secondary | ICD-10-CM | POA: Diagnosis not present

## 2017-08-15 DIAGNOSIS — E7800 Pure hypercholesterolemia, unspecified: Secondary | ICD-10-CM | POA: Diagnosis not present

## 2017-08-15 DIAGNOSIS — M1990 Unspecified osteoarthritis, unspecified site: Secondary | ICD-10-CM | POA: Diagnosis not present

## 2017-08-15 DIAGNOSIS — Z8572 Personal history of non-Hodgkin lymphomas: Secondary | ICD-10-CM | POA: Diagnosis not present

## 2017-08-15 LAB — CBC
HEMATOCRIT: 32.1 % — AB (ref 39.0–52.0)
HEMOGLOBIN: 10.2 g/dL — AB (ref 13.0–17.0)
MCH: 31.3 pg (ref 26.0–34.0)
MCHC: 31.8 g/dL (ref 30.0–36.0)
MCV: 98.5 fL (ref 78.0–100.0)
Platelets: 126 10*3/uL — ABNORMAL LOW (ref 150–400)
RBC: 3.26 MIL/uL — AB (ref 4.22–5.81)
RDW: 17.7 % — ABNORMAL HIGH (ref 11.5–15.5)
WBC: 3.2 10*3/uL — AB (ref 4.0–10.5)

## 2017-08-15 LAB — HEMOGLOBIN A1C
Hgb A1c MFr Bld: 5.6 % (ref 4.8–5.6)
MEAN PLASMA GLUCOSE: 114.02 mg/dL

## 2017-08-15 LAB — GLUCOSE, CAPILLARY: Glucose-Capillary: 109 mg/dL — ABNORMAL HIGH (ref 70–99)

## 2017-08-15 NOTE — Progress Notes (Signed)
RN called and spoke with anesthesia  Dr Valma Cava. RN consulting if patient needs cardiac clearance as patient has  Hx of Afib. RN made Dr Valma Cava aware that patient is managed on warfarin ,, now bridging with Lovenox for upcoming surgery , and that last ECHO in June 2019 had EF 55-60% , no AV stenosis , and that last EKG in epic showed sinus tach per Dr Valma Cava, patient may proceed as scheduled, no clearance mandated

## 2017-08-16 ENCOUNTER — Other Ambulatory Visit: Payer: Self-pay | Admitting: Family Medicine

## 2017-08-17 NOTE — H&P (Signed)
HPI: Gary Yates is a 84 year-old male with urinary retention.  His problem was diagnosed 06/06/2017. His current symptoms did not begin after he had a surgical procedure. His urinary retention is being treated with foley catheter. Patient denies suprapubic tube, intemittent catheterization, flomax, hytrin, cardura, uroxatrol, rapaflo, avodart, and proscar.   His urine has shut off completely.   He has previously had an indwelling catheter in for more than two weeks at a time.   07/22/17: When I saw him on 07/09/17 we went over the results of his urodynamic study and the fact that his options would be continued urethral catheter versus a suprapubic tube. He did not want to consider CIC.  He came in today with his wife and daughter and has given the options that I have discussed with him further thought and would like to consider placement of a suprapubic tube. They had many questions that were written out and I addressed each of these.     ALLERGIES: ACE Inhibitors - Other Reaction, Unknown Codeine Derivatives - Itching Penicillins - Other Reaction, years ago    MEDICATIONS: Ativan 0.5 mg tablet TAKE 1 TABLET EVERY 6 HOURS AS NEEDED NASUSEA OR VOMITING  Tamsulosin Hcl 0.4 mg capsule TAKE 1 CAPSULE AT BEDTIME.  Fenofibrate 134 mg capsule Oral  Gabapentin 300 mg capsule Oral  Isosorbide Mononitrate Er 30 mg tablet, extended release 24 hr Oral  Metoprolol Succinate 100 mg tablet, extended release 24 hr Oral  Nitroglycerin 0.4 MG Sublingual Tablet Sublingual Sublingual  Pramipexole Dihydrochloride 1.5 mg tablet Oral  Protonix 40 mg tablet, delayed release Oral  Warfarin Sodium 5 mg tablet Oral     GU PSH: Complex cystometrogram, w/ void pressure and urethral pressure profile studies, any technique - 06/30/2017 Complex Uroflow - 06/30/2017 Emg surf Electrd - 06/30/2017 ESWL - 2008 Hydrocele repair, Right - 09/01/2015 Inject For cystogram - 06/30/2017 Intrabd voidng Press - 06/30/2017 Scrotal  Exploration, Right - 09/01/2015 Simple orchiectomy, Right - 09/01/2015    NON-GU PSH: Appendectomy - 2015 Back surgery Cardiac Stent Placement Cholecystectomy (open) - 2015 Hernia Repair - 2015 Knee Arthroscopy Shoulder Surgery (Unspecified)    GU PMH: Urinary Retention (Stable), I am going to leave the urethral Foley indwelling and he was supplied with a catheter plug. He will then have his catheter changed on a monthly basis. If he does decide to proceed with SP tube placement he is on Coumadin which he has stopped in the past. This would need to be stopped again and the procedure could be performed as an outpatient. - 07/09/2017, (Worsening, Chronic), Will maintain foley at this time. Stop Oxybutynin and Trospium. Will proceed with UDS and cysto w/Dr. Karsten Ro. He was given Lidocaine 2% urojet to try placing small amount to meatus for dysuria. He can also use Tylenol for dysuria. , - 06/27/2017 Overactive bladder, Overactive bladder - 2015 Urge incontinence, Urge incontinence of urine - 2015 Balanitis, Balanitis - 2015 ED due to arterial insufficiency, Erectile dysfunction due to arterial insufficiency - 2015      PMH Notes: Nephrolithiasis: He was diagnosed with a left ureteral stone in 10/06 and at that time indicated. Past 3 stones spontaneously previously. He underwent stent placement and then lithotripsy of his stone.  Stone analysis: Calcium oxalate  24 hour urine: The only abnormality was a low 24 hour volume of 850 cc.   Bilateral renal cyst: These have been noted on CT scan previously.   Prostate nodularity: On examination he was noted by Dr. Serita Butcher  to have "mild prostate nodularity" his PSAs remained normal.   Incontinence: He reported that for several months he has had difficulty with progressive urgency with associated urge incontinence. This occurs 4-5 times per day and is associated with nocturia 3-4 times as well as nocturnal enuresis.  Urodynamics 12/09/13: Although he had  500 cc in his bladder initially he had no urge to urinate and then was found to have a diminished functional capacity with instability. The pressure flow study however revealed an interrupted pattern with poorly sustained contractions and an elevated PVR.      NON-GU PMH: Other specified postprocedural states - 09/12/2015 Personal history of other diseases of the circulatory system, History of atrial fibrillation - 2015, History of cardiac arrhythmia, - 2015, History of cardiac disorder, - 2015, History of hypertension, - 2014 Personal history of other diseases of the nervous system and sense organs, History of sleep apnea - 2014 Personal history of other endocrine, nutritional and metabolic disease, History of hypercholesterolemia - 2014, History of diabetes mellitus, - 2014 Unspecified B-cell lymphoma, unspecified site    FAMILY HISTORY: cardiac disorder - Runs In Family Family Health Status Number - Runs In Family   SOCIAL HISTORY: Marital Status: Married Preferred Language: English; Race: White Current Smoking Status: Patient does not smoke anymore.  Has never drank.  Drinks 2 caffeinated drinks per day.     Notes: Alcohol use, Mother deceased, Retired, Caffeine use, Former smoker, Father deceased, Number of children, Tobacco Use, Marital History - Currently Married   REVIEW OF SYSTEMS:    GU Review Male:   Patient denies frequent urination, hard to postpone urination, burning/ pain with urination, get up at night to urinate, leakage of urine, stream starts and stops, trouble starting your stream, have to strain to urinate , erection problems, and penile pain.  Gastrointestinal (Upper):   Patient denies nausea, vomiting, and indigestion/ heartburn.  Gastrointestinal (Lower):   Patient denies diarrhea and constipation.  Constitutional:   Patient denies fever, night sweats, weight loss, and fatigue.  Skin:   Patient denies skin rash/ lesion and itching.  Eyes:   Patient denies blurred  vision and double vision.  Ears/ Nose/ Throat:   Patient denies sore throat and sinus problems.  Hematologic/Lymphatic:   Patient denies swollen glands and easy bruising.  Cardiovascular:   Patient denies leg swelling and chest pains.  Respiratory:   Patient denies cough and shortness of breath.  Endocrine:   Patient denies excessive thirst.  Musculoskeletal:   Patient denies joint pain and back pain.  Neurological:   Patient denies headaches and dizziness.  Psychologic:   Patient denies depression and anxiety.   VITAL SIGNS:    Weight 206 lb / 93.44 kg  Height 70 in / 177.8 cm  BP 105/64 mmHg  Pulse 67 /min  BMI 29.6 kg/m   Physical Exam  Constitutional: Well nourished and well developed . No acute distress.   ENT:. The ears and nose are normal in appearance.   Neck: The appearance of the neck is normal and no neck mass is present.   Pulmonary: No respiratory distress and normal respiratory rhythm and effort.   Cardiovascular: Heart rate and rhythm are normal . No peripheral edema.   Abdomen: The abdomen is soft and nontender. No masses are palpated. No CVA tenderness. No hernias are palpable. No hepatosplenomegaly noted.   Rectal: Rectal exam demonstrates normal sphincter tone, no tenderness and no masses. Estimated prostate size is 1+. The prostate has no nodularity and  is not tender. The left seminal vesicle is nonpalpable. The right seminal vesicle is nonpalpable. The perineum is normal on inspection.   Genitourinary: Examination of the penis demonstrates no discharge, no masses, no lesions and a normal meatus. The scrotum is without lesions. The right epididymis is palpably normal and non-tender. The left epididymis is palpably normal and non-tender. The right testis is non-tender and without masses. The left testis is non-tender and without masses.  Lymphatics: The femoral and inguinal nodes are not enlarged or tender.   Skin: Normal skin turgor, no visible rash and no  visible skin lesions.   Neuro/Psych:. Mood and affect are appropriate.    PAST DATA REVIEWED:  Source Of History:  Patient   02/14/06 08/16/05  PSA  Total PSA 0.94  1.28     02/14/06  Hormones  Testosterone, Total 1.95     ASSESSMENT/PLAN:  Urinary Retention  We discussed suprapubic tube placement as an option for managing his urinary retention with the understanding that he can undergo a voiding trial at any time with this arrangement. In addition he understands that this does not decrease his risk of infection over having a urethral catheter and that it would require changing every 4-6 weeks.  I then discussed the procedure in detail with him including how the procedure would be performed both cystoscopically and via the suprapubic approach allowing placement of the SP tube. We discussed the risks and complications as well as the outpatient nature of the procedure and the anticipated postoperative course.  Because of the presence of an indwelling catheter urine culture will be obtained in preparation for surgery.  He takes Coumadin. He has stop that several times for surgeries in the past. The last time he took it was about 12 months ago. He is going to need to be off his Coumadin 5 days prior to the procedure.  He has not undergone any abdominal surgery in the past.    He will undergo: 1. preoperative urine culture - >3 organisms. 2. stop his Coumadin 5 days prior to surgery - done 3. proceed with suprapubic tube placement.

## 2017-08-18 ENCOUNTER — Ambulatory Visit (HOSPITAL_COMMUNITY): Payer: Medicare Other | Admitting: Anesthesiology

## 2017-08-18 ENCOUNTER — Encounter (HOSPITAL_COMMUNITY): Payer: Self-pay | Admitting: *Deleted

## 2017-08-18 ENCOUNTER — Ambulatory Visit (HOSPITAL_COMMUNITY)
Admission: RE | Admit: 2017-08-18 | Discharge: 2017-08-18 | Disposition: A | Discharge status: Home / Self Care | Payer: Medicare Other | Source: Ambulatory Visit | Attending: Urology | Admitting: Urology

## 2017-08-18 ENCOUNTER — Encounter (HOSPITAL_COMMUNITY)
Admission: RE | Disposition: A | Discharge status: Home / Self Care | Payer: Self-pay | Source: Ambulatory Visit | Attending: Urology

## 2017-08-18 DIAGNOSIS — N320 Bladder-neck obstruction: Secondary | ICD-10-CM | POA: Insufficient documentation

## 2017-08-18 DIAGNOSIS — Z7901 Long term (current) use of anticoagulants: Secondary | ICD-10-CM | POA: Insufficient documentation

## 2017-08-18 DIAGNOSIS — Z885 Allergy status to narcotic agent status: Secondary | ICD-10-CM | POA: Diagnosis not present

## 2017-08-18 DIAGNOSIS — Z87891 Personal history of nicotine dependence: Secondary | ICD-10-CM | POA: Insufficient documentation

## 2017-08-18 DIAGNOSIS — D649 Anemia, unspecified: Secondary | ICD-10-CM | POA: Insufficient documentation

## 2017-08-18 DIAGNOSIS — Z8249 Family history of ischemic heart disease and other diseases of the circulatory system: Secondary | ICD-10-CM | POA: Insufficient documentation

## 2017-08-18 DIAGNOSIS — Z9049 Acquired absence of other specified parts of digestive tract: Secondary | ICD-10-CM | POA: Insufficient documentation

## 2017-08-18 DIAGNOSIS — Z79899 Other long term (current) drug therapy: Secondary | ICD-10-CM | POA: Insufficient documentation

## 2017-08-18 DIAGNOSIS — I10 Essential (primary) hypertension: Secondary | ICD-10-CM | POA: Insufficient documentation

## 2017-08-18 DIAGNOSIS — Z955 Presence of coronary angioplasty implant and graft: Secondary | ICD-10-CM | POA: Insufficient documentation

## 2017-08-18 DIAGNOSIS — M1990 Unspecified osteoarthritis, unspecified site: Secondary | ICD-10-CM | POA: Insufficient documentation

## 2017-08-18 DIAGNOSIS — I4891 Unspecified atrial fibrillation: Secondary | ICD-10-CM | POA: Insufficient documentation

## 2017-08-18 DIAGNOSIS — N312 Flaccid neuropathic bladder, not elsewhere classified: Secondary | ICD-10-CM | POA: Insufficient documentation

## 2017-08-18 DIAGNOSIS — R338 Other retention of urine: Secondary | ICD-10-CM

## 2017-08-18 DIAGNOSIS — E119 Type 2 diabetes mellitus without complications: Secondary | ICD-10-CM | POA: Insufficient documentation

## 2017-08-18 DIAGNOSIS — G4730 Sleep apnea, unspecified: Secondary | ICD-10-CM | POA: Insufficient documentation

## 2017-08-18 DIAGNOSIS — Z880 Allergy status to penicillin: Secondary | ICD-10-CM | POA: Insufficient documentation

## 2017-08-18 DIAGNOSIS — E7800 Pure hypercholesterolemia, unspecified: Secondary | ICD-10-CM | POA: Insufficient documentation

## 2017-08-18 DIAGNOSIS — Z87442 Personal history of urinary calculi: Secondary | ICD-10-CM | POA: Insufficient documentation

## 2017-08-18 DIAGNOSIS — Z8572 Personal history of non-Hodgkin lymphomas: Secondary | ICD-10-CM | POA: Insufficient documentation

## 2017-08-18 HISTORY — PX: CYSTOSCOPY: SHX5120

## 2017-08-18 LAB — GLUCOSE, CAPILLARY
Glucose-Capillary: 83 mg/dL (ref 70–99)
Glucose-Capillary: 119 mg/dL — ABNORMAL HIGH (ref 70–99)

## 2017-08-18 SURGERY — CYSTOSCOPY
Anesthesia: General

## 2017-08-18 MED ORDER — BUPIVACAINE-EPINEPHRINE (PF) 0.5% -1:200000 IJ SOLN
INTRAMUSCULAR | Status: AC
  Filled 2017-08-18: qty 30

## 2017-08-18 MED ORDER — MEPERIDINE HCL 50 MG/ML IJ SOLN: 6.2500 mg | INTRAMUSCULAR | Status: DC | PRN

## 2017-08-18 MED ORDER — ACETAMINOPHEN 10 MG/ML IV SOLN: 1000.0000 mg | Freq: Once | INTRAVENOUS | Status: DC | PRN

## 2017-08-18 MED ORDER — HYDROCODONE-ACETAMINOPHEN 10-325 MG PO TABS: 1.0000 | ORAL_TABLET | ORAL | 0 refills | Status: DC | PRN

## 2017-08-18 MED ORDER — LIDOCAINE 2% (20 MG/ML) 5 ML SYRINGE
INTRAMUSCULAR | Status: DC | PRN
  Administered 2017-08-18: 100 mg via INTRAVENOUS

## 2017-08-18 MED ORDER — ONDANSETRON HCL 4 MG/2ML IJ SOLN
INTRAMUSCULAR | Status: DC | PRN
  Administered 2017-08-18: 4 mg via INTRAVENOUS

## 2017-08-18 MED ORDER — CIPROFLOXACIN HCL 500 MG PO TABS: 500.0000 mg | ORAL_TABLET | Freq: Two times a day (BID) | ORAL | 0 refills | Status: DC

## 2017-08-18 MED ORDER — MIDAZOLAM HCL 2 MG/2ML IJ SOLN
INTRAMUSCULAR | Status: AC
  Filled 2017-08-18: qty 2

## 2017-08-18 MED ORDER — DEXAMETHASONE SODIUM PHOSPHATE 10 MG/ML IJ SOLN
INTRAMUSCULAR | Status: DC | PRN
  Administered 2017-08-18: 10 mg via INTRAVENOUS

## 2017-08-18 MED ORDER — ONDANSETRON HCL 4 MG/2ML IJ SOLN
INTRAMUSCULAR | Status: AC
  Filled 2017-08-18: qty 2

## 2017-08-18 MED ORDER — STERILE WATER FOR IRRIGATION IR SOLN
Status: DC | PRN
  Administered 2017-08-18: 6000 mL

## 2017-08-18 MED ORDER — BUPIVACAINE-EPINEPHRINE 0.5% -1:200000 IJ SOLN
INTRAMUSCULAR | Status: DC | PRN
  Administered 2017-08-18: 20 mL

## 2017-08-18 MED ORDER — PROMETHAZINE HCL 25 MG/ML IJ SOLN
6.2500 mg | INTRAMUSCULAR | Status: DC | PRN
  Administered 2017-08-18: 12.5 mg via INTRAVENOUS

## 2017-08-18 MED ORDER — PROMETHAZINE HCL 25 MG/ML IJ SOLN
INTRAMUSCULAR | Status: AC
  Filled 2017-08-18: qty 1

## 2017-08-18 MED ORDER — CIPROFLOXACIN IN D5W 400 MG/200ML IV SOLN
400.0000 mg | Freq: Two times a day (BID) | INTRAVENOUS | Status: DC
  Administered 2017-08-18: 400 mg via INTRAVENOUS
  Filled 2017-08-18: qty 200

## 2017-08-18 MED ORDER — FENTANYL CITRATE (PF) 100 MCG/2ML IJ SOLN
INTRAMUSCULAR | Status: AC
  Filled 2017-08-18: qty 2

## 2017-08-18 MED ORDER — LIDOCAINE 2% (20 MG/ML) 5 ML SYRINGE
INTRAMUSCULAR | Status: AC
  Filled 2017-08-18: qty 5

## 2017-08-18 MED ORDER — LACTATED RINGERS IV SOLN
INTRAVENOUS | Status: DC
  Administered 2017-08-18: 08:00:00 via INTRAVENOUS

## 2017-08-18 MED ORDER — PROPOFOL 10 MG/ML IV BOLUS
INTRAVENOUS | Status: AC
  Filled 2017-08-18: qty 20

## 2017-08-18 MED ORDER — FENTANYL CITRATE (PF) 100 MCG/2ML IJ SOLN
INTRAMUSCULAR | Status: DC | PRN
  Administered 2017-08-18 (×2): 50 ug via INTRAVENOUS

## 2017-08-18 MED ORDER — PROPOFOL 10 MG/ML IV BOLUS
INTRAVENOUS | Status: DC | PRN
  Administered 2017-08-18: 120 mg via INTRAVENOUS
  Administered 2017-08-18: 20 mg via INTRAVENOUS
  Administered 2017-08-18: 30 mg via INTRAVENOUS

## 2017-08-18 MED ORDER — FENTANYL CITRATE (PF) 100 MCG/2ML IJ SOLN: 25.0000 ug | INTRAMUSCULAR | Status: DC | PRN

## 2017-08-18 SURGICAL SUPPLY — 18 items
BAG URINE DRAINAGE (UROLOGICAL SUPPLIES) ×2 IMPLANT
BAG URO CATCHER STRL LF (MISCELLANEOUS) ×2 IMPLANT
CATH FOLEY 2WAY SLVR  5CC 16FR (CATHETERS) ×1
CATH FOLEY 2WAY SLVR 5CC 16FR (CATHETERS) ×1 IMPLANT
CATH FOLEY INTRO SUPRA 16F (CATHETERS) ×2 IMPLANT
CATH INTERMIT  6FR 70CM (CATHETERS) IMPLANT
CLOTH BEACON ORANGE TIMEOUT ST (SAFETY) IMPLANT
ELECT PENCIL ROCKER SW 15FT (MISCELLANEOUS) ×2 IMPLANT
GLOVE BIOGEL M 8.0 STRL (GLOVE) ×2 IMPLANT
GOWN STRL REUS W/ TWL XL LVL3 (GOWN DISPOSABLE) ×1 IMPLANT
GOWN STRL REUS W/TWL XL LVL3 (GOWN DISPOSABLE) ×1
GUIDEWIRE STR DUAL SENSOR (WIRE) IMPLANT
MANIFOLD NEPTUNE II (INSTRUMENTS) ×2 IMPLANT
NEEDLE HYPO 22GX1.5 SAFETY (NEEDLE) ×2 IMPLANT
NEEDLE SPNL 22GX3.5 QUINCKE BK (NEEDLE) ×2 IMPLANT
PACK CYSTO (CUSTOM PROCEDURE TRAY) ×2 IMPLANT
SYR CONTROL 10ML LL (SYRINGE) ×2 IMPLANT
TUBING CONNECTING 10 (TUBING) ×2 IMPLANT

## 2017-08-18 NOTE — Discharge Instructions (Addendum)
Suprapubic Catheter Home Guide A suprapubic catheter is a rubber tube used to drain urine from the bladder into a collection bag. The catheter is inserted into the bladder through a small opening in the in the lower abdomen, near the center of the body, above the pubic bone (suprapubic area). There is a tiny balloon filled with germ-free (sterile) water on the end of the catheter that is in the bladder. The balloon helps to keep the catheter in place. Your suprapubic catheter may need to be replaced every 4-6 weeks, or as often as recommended by your health care provider. The collection bag must be emptied every day and cleaned every 2-3 days. The collection bag can be put beside your bed at night and attached to your leg during the day. You may have a large collection bag to use at night and a smaller one to use during the day. What are the risks?  Urine flow can become blocked. This can happen if the catheter is not working correctly, or if you have a blood clot in your bladder or in the catheter.  Tissue near the catheter may can become irritated and bleed.  Bacteria may get into your bladder and cause a urinary tract infection. How do I change the catheter? Supplies needed  Two pairs of sterile gloves.  Catheter.  Two syringes.  Sterile water.  Sterile cleaning solution.  Lubricant.  Collection bags. Changing the catheter To replace your catheter, take the following steps: 1. Drink plenty of fluids during the hours before you plan to change the catheter. 2. Wash your hands with soap and water. If soap and water are not available, use hand sanitizer. 3. Lie on your back and put on sterile gloves. 4. Clean the skin around the catheter opening using the sterile cleaning solution. 5. Remove the water from the balloon using a syringe. 6. Slowly remove the catheter. ? Do not pull on the catheter if it seems stuck. ? Call your health care provider immediately if you have difficulty  removing the catheter. 7. Take off the used gloves, and put on a new pair. 8. Put lubricant on the end of the new catheter that will go into your bladder. 9. Gently slide the catheter through the opening in your abdomen and into your bladder. 10. Wait for some urine to start flowing through the catheter. When urine starts to flow through the catheter, use a new syringe to fill the balloon with sterile water. 11. Attach the collection bag to the end of the catheter. Make sure the connection is tight. 12. Remove the gloves and wash your hands with soap and water.  How do I care for my skin around the catheter? Use a clean washcloth and soapy water to clean the skin around your catheter every day. Pat the area dry with a clean towel.  Do not pull on the catheter.  Do not use ointment or lotion on this area unless told by your health care provider.  Check your skin around the catheter every day for signs of infection. Check for: ? Redness, swelling, or pain. ? Fluid or blood. ? Warmth. ? Pus or a bad smell.  How do I clean and empty the collection bag? Clean the collection bag every 2-3 days, or as often as told by your health care provider. To do this, take the following steps:  Wash your hands with soap and water. If soap and water are not available, use hand sanitizer.  Disconnect the bag  from the catheter and immediately attach a new bag to the catheter.  Empty the used bag completely.  Clean the used bag using one of the following methods: ? Rinse the used bag with warm water and soap. ? Fill the bag with water and add 1 tsp of vinegar. Let it sit for about 30 minutes, then empty the bag.  Let the bag dry completely, and put it in a clean plastic bag before storing it.  Empty the large collection bag every 8 hours. Empty the small collection bag when it is about ? full. To empty your large or small collection bag, take the following steps:  Always keep the bag below the level  of the catheter. This keeps urine from flowing backwards into the catheter.  Hold the bag over the toilet or another container. Turn the valve (spigot) at the bottom of the bag to empty the urine. ? Do not touch the opening of the spigot. ? Do not let the opening touch the toilet or container.  Close the spigot tightly when the bag is empty.  What are some general tips?  Always wash your hands before and after caring for your catheter and collection bag. Use a mild, fragrance-free soap. If soap and water are not available, use hand sanitizer.  Clean the catheter with soap and water as often as told by your health care provider.  Always make sure there are no twists or curls (kinks) in the catheter tube.  Always make sure there are no leaks in the catheter or collection bag.  Drink enough fluid to keep your urine clear or pale yellow.  Do not take baths, swim, or use a hot tub. When should I seek medical care? Seek medical care if:  You leak urine.  You have redness, swelling, or pain around your catheter opening.  You have fluid or blood coming from your catheter opening.  Your catheter opening feels warm to the touch.  You have pus or a bad smell coming from your catheter opening.  You have a fever or chills.  Your urine flow slows down.  Your urine becomes cloudy or smelly.  Medications: Restart Coumadin in 24 hours as long as the urine is clear to light pink.  When should I seek immediate medical care? Seek immediate medical care if your catheter comes out, or if you have:  Nausea.  Back pain.  Difficulty changing your catheter.  Blood in your urine.  No urine flow for 1 hour.

## 2017-08-18 NOTE — Anesthesia Preprocedure Evaluation (Signed)
Anesthesia Evaluation  Patient identified by MRN, date of birth, ID band Patient awake    Reviewed: Allergy & Precautions, NPO status , Patient's Chart, lab work & pertinent test results, reviewed documented beta blocker date and time   Airway Mallampati: I       Dental  (+) Teeth Intact, Poor Dentition   Pulmonary former smoker,    Pulmonary exam normal breath sounds clear to auscultation       Cardiovascular hypertension, Pt. on medications and Pt. on home beta blockers Normal cardiovascular exam Rhythm:Regular Rate:Normal     Neuro/Psych    GI/Hepatic GERD  Medicated,  Endo/Other  diabetes, Type 2, Oral Hypoglycemic Agents  Renal/GU  Bladder dysfunction   negative genitourinary   Musculoskeletal  (+) Arthritis , Osteoarthritis,    Abdominal Normal abdominal exam  (+) - obese,   Peds  Hematology  (+) anemia ,   Anesthesia Other Findings   Reproductive/Obstetrics                             Anesthesia Physical Anesthesia Plan  ASA: II  Anesthesia Plan: General   Post-op Pain Management:    Induction: Intravenous  PONV Risk Score and Plan: 3 and Ondansetron and Treatment may vary due to age or medical condition  Airway Management Planned: LMA  Additional Equipment:   Intra-op Plan:   Post-operative Plan: Extubation in OR  Informed Consent: I have reviewed the patients History and Physical, chart, labs and discussed the procedure including the risks, benefits and alternatives for the proposed anesthesia with the patient or authorized representative who has indicated his/her understanding and acceptance.   Dental advisory given  Plan Discussed with: CRNA and Surgeon  Anesthesia Plan Comments:         Anesthesia Quick Evaluation

## 2017-08-18 NOTE — Op Note (Signed)
PATIENT:  Gary Yates.  PRE-OPERATIVE DIAGNOSIS: Urinary retention  POST-OPERATIVE DIAGNOSIS: Same  PROCEDURE: 1.  Cystoscopy. 2.  Suprapubic tube placement.  SURGEON:  Claybon Jabs  INDICATION: Gary Yates. is a 84 year old male who developed urinary retention in 5/19.  He was found by urodynamics, after failing voiding trials, to have a hypotonic bladder with outlet obstruction.  We therefore discussed options for further management and he did not want to consider self-catheterization.  He has elected to proceed with continued bladder drainage with a tube but would prefer suprapubic tube to urethral Foley catheter.  He therefore is brought to the operating room today for SP tube placement.  ANESTHESIA:  General  EBL:  Minimal  DRAINS: 33 French Foley catheter as a suprapubic tube  LOCAL MEDICATIONS USED: 1/2 percent Marcaine with epinephrine  SPECIMEN: None  Description of procedure: After informed consent the patient was taken to the operating room and placed on the table in a supine position. General anesthesia was then administered. Once fully anesthetized the patient was moved to the dorsal lithotomy position and the genitalia were sterilely prepped and draped in standard fashion. An official timeout was then performed.  The 23 French cystoscope was passed under direct vision down the urethra which is noted to be normal.  The prostatic urethra revealed some lateral lobe hypertrophy and an elevated bladder neck/median lobe.  The bladder was entered and noted to have 3-4+ trabeculation.  There was irritation of the bladder wall posteriorly from the indwelling Foley catheter.  There were no worrisome lesions identified within the bladder.  The patient was placed in Trendelenburg position and I made a [name] on his lower abdomen in the midline approximately 3 fingerbreadths above the symphysis pubis.  I then injected local anesthetic in this location and using a spinal needle  directed this toward the dome of the bladder which was previously noted by filling the bladder to capacity using the 70 degree lens and applying digital pressure at the intended location of the tube placement.  This was located at the dome of the bladder.  I passed the spinal needle into the bladder under direct vision.  I then injected local anesthetic as I withdrew the needle in the tract and then made a transverse incision in the skin at the location where the needle had entered.  I used the trocar introducer and passed this through the suprapubic incision and through the dome of the bladder under direct cystoscopic visualization with a 70 degree lens.  I removed the peel-away sheath after passing the 16 French Foley catheter through the sheath and into the bladder and filling the balloon with 10 cc of water.  There was a small area of bleeding on the anterior wall and this was fulgurated with the Bugbee electrode.  I then secured to the catheter to the skin with a figure-of-eight 2-0 silk suture which was then tied to the catheter.  This was connected to closed system drainage and the patient was awakened and taken to the recovery room in stable and satisfactory condition.  He tolerated the procedure well with no intraoperative complications.  He will be maintained on oral antibiotics postoperatively and follow-up in 4 weeks for his suprapubic tube change.  PLAN OF CARE: Discharge to home after PACU  PATIENT DISPOSITION:  PACU - hemodynamically stable.

## 2017-08-18 NOTE — Anesthesia Postprocedure Evaluation (Signed)
Anesthesia Post Note  Patient: Gary Yates.  Procedure(s) Performed: CYSTOSCOPY WITH FULGURATION AND SUPRA PUBIC TUBE PLACEMENT (N/A )     Patient location during evaluation: PACU Anesthesia Type: General Level of consciousness: awake Pain management: pain level controlled Vital Signs Assessment: post-procedure vital signs reviewed and stable Respiratory status: spontaneous breathing Cardiovascular status: stable Postop Assessment: no apparent nausea or vomiting Anesthetic complications: no    Last Vitals:  Vitals:   08/18/17 1045 08/18/17 1100  BP: 123/65 (!) 132/58  Pulse: 79 81  Resp: 16 12  Temp:    SpO2: 100% 96%    Last Pain:  Vitals:   08/18/17 1114  TempSrc:   PainSc: 0-No pain   Pain Goal:                 [name],[name]

## 2017-08-18 NOTE — Anesthesia Procedure Notes (Signed)
Procedure Name: LMA Insertion Date/Time: 08/18/2017 9:40 AM Performed by: Sharlette Dense, CRNA Patient Re-evaluated:Patient Re-evaluated prior to induction Oxygen Delivery Method: Circle system utilized Preoxygenation: Pre-oxygenation with 100% oxygen Induction Type: IV induction Ventilation: Mask ventilation without difficulty LMA: LMA with gastric port inserted LMA Size: 4.0 Number of attempts: 1 Placement Confirmation: positive ETCO2 and breath sounds checked- equal and bilateral Tube secured with: Tape Dental Injury: Teeth and Oropharynx as per pre-operative assessment

## 2017-08-18 NOTE — Transfer of Care (Signed)
Immediate Anesthesia Transfer of Care Note  Patient: Gary Yates.  Procedure(s) Performed: CYSTOSCOPY WITH FULGURATION AND SUPRA PUBIC TUBE PLACEMENT (N/A )  Patient Location: PACU  Anesthesia Type:General  Level of Consciousness: awake and alert   Airway & Oxygen Therapy: Patient Spontanous Breathing and Patient connected to face mask oxygen  Post-op Assessment: Report given to RN and Post -op Vital signs reviewed and stable  Post vital signs: Reviewed and stable  Last Vitals:  Vitals Value Taken Time  BP 124/72 08/18/2017 10:23 AM  Temp    Pulse 82 08/18/2017 10:25 AM  Resp 12 08/18/2017 10:25 AM  SpO2 100 % 08/18/2017 10:25 AM  Vitals shown include unvalidated device data.  Last Pain:  Vitals:   08/18/17 0806  TempSrc:   PainSc: 0-No pain         Complications: No apparent anesthesia complications

## 2017-08-19 ENCOUNTER — Encounter (HOSPITAL_COMMUNITY): Payer: Self-pay | Admitting: Urology

## 2017-08-20 ENCOUNTER — Other Ambulatory Visit: Payer: Self-pay | Admitting: Family Medicine

## 2017-08-22 ENCOUNTER — Emergency Department (HOSPITAL_COMMUNITY)
Admission: EM | Admit: 2017-08-22 | Discharge: 2017-08-22 | Disposition: A | Discharge status: Home / Self Care | Payer: Medicare Other | Attending: Emergency Medicine | Admitting: Emergency Medicine

## 2017-08-22 ENCOUNTER — Encounter (HOSPITAL_COMMUNITY): Payer: Self-pay | Admitting: Emergency Medicine

## 2017-08-22 ENCOUNTER — Other Ambulatory Visit: Payer: Self-pay

## 2017-08-22 DIAGNOSIS — E1140 Type 2 diabetes mellitus with diabetic neuropathy, unspecified: Secondary | ICD-10-CM | POA: Insufficient documentation

## 2017-08-22 DIAGNOSIS — Z87891 Personal history of nicotine dependence: Secondary | ICD-10-CM | POA: Insufficient documentation

## 2017-08-22 DIAGNOSIS — I129 Hypertensive chronic kidney disease with stage 1 through stage 4 chronic kidney disease, or unspecified chronic kidney disease: Secondary | ICD-10-CM | POA: Insufficient documentation

## 2017-08-22 DIAGNOSIS — Z7984 Long term (current) use of oral hypoglycemic drugs: Secondary | ICD-10-CM | POA: Insufficient documentation

## 2017-08-22 DIAGNOSIS — Z7901 Long term (current) use of anticoagulants: Secondary | ICD-10-CM | POA: Insufficient documentation

## 2017-08-22 DIAGNOSIS — R339 Retention of urine, unspecified: Secondary | ICD-10-CM

## 2017-08-22 DIAGNOSIS — Z79899 Other long term (current) drug therapy: Secondary | ICD-10-CM | POA: Insufficient documentation

## 2017-08-22 DIAGNOSIS — I2510 Atherosclerotic heart disease of native coronary artery without angina pectoris: Secondary | ICD-10-CM | POA: Insufficient documentation

## 2017-08-22 DIAGNOSIS — J449 Chronic obstructive pulmonary disease, unspecified: Secondary | ICD-10-CM | POA: Diagnosis not present

## 2017-08-22 DIAGNOSIS — I480 Paroxysmal atrial fibrillation: Secondary | ICD-10-CM | POA: Diagnosis not present

## 2017-08-22 DIAGNOSIS — E785 Hyperlipidemia, unspecified: Secondary | ICD-10-CM | POA: Diagnosis not present

## 2017-08-22 DIAGNOSIS — T83098A Other mechanical complication of other indwelling urethral catheter, initial encounter: Secondary | ICD-10-CM | POA: Diagnosis not present

## 2017-08-22 DIAGNOSIS — Y829 Unspecified medical devices associated with adverse incidents: Secondary | ICD-10-CM | POA: Diagnosis not present

## 2017-08-22 DIAGNOSIS — N183 Chronic kidney disease, stage 3 (moderate): Secondary | ICD-10-CM | POA: Insufficient documentation

## 2017-08-22 NOTE — ED Notes (Signed)
Patient catheter irrigated and urine is flowing without any problems.

## 2017-08-22 NOTE — ED Triage Notes (Signed)
Patient complaining of urine retention. Patient has a suprapubic catheter. Patient was seen in doctors office yesterday for the same thing.

## 2017-08-22 NOTE — ED Notes (Signed)
Bed: [facility] Expected date:  Expected time:  Means of arrival:  Comments: 

## 2017-08-22 NOTE — ED Provider Notes (Signed)
[facility] DEPT Provider Note   CSN: [id number] Arrival date & time: 08/22/17  2054     History   Chief Complaint Chief Complaint  Patient presents with  . Urinary Retention    HPI Gary Yates. is a 84 y.o. male.  84 yo M with a chief complaint of acute urinary retention.  The patient had a suprapubic catheter placed.  He had an issue with it being clogged yesterday and had a flushed in the urology office.  Had occurred again today about 4 PM.  Like he needed to urinate but nothing was coming out of his catheter.  Irrigated by nursing and now he is feeling much better.  Denied any overt pain denies flank pain.  The history is provided by the patient.  Abdominal Pain   This is a new problem. The current episode started less than 1 hour ago. The problem occurs constantly. The problem has not changed since onset.The pain is located in the suprapubic region. The quality of the pain is cramping. The pain is at a severity of 4/10. The pain is mild. Pertinent negatives include fever, diarrhea, vomiting, headaches, arthralgias and myalgias. Nothing aggravates the symptoms. Nothing relieves the symptoms.    Past Medical History:  Diagnosis Date  . Anemia in chronic renal disease 05/07/2017  . Anxiety   . Atrial fibrillation ([place])   . COPD (chronic obstructive pulmonary disease) ([place])    pt. denies  . Coronary artery disease    a. h/o Overlapping stents RCA;  b. 06/2011 Cath: patent stents, nonobs dzs, NL EF.  . Diabetic peripheral neuropathy ([place])   . Diffuse non-Hodgkin's lymphoma of testis ([place]) 09/28/2015  . DM (diabetes mellitus) ([place])    Type 2, peripheral neuropathy.  . Dyspnea    with exertion  . Dysrhythmia   . GERD (gastroesophageal reflux disease)   . Headache   . History of bronchitis   . History of kidney stones   . History of radiation therapy 02/19/16 - 03/13/16   Testis/Scrotum: 32.4 Gy in 18 fractions  . History of radiation therapy  08/07/16-08/20/16   left adrenal gland mass treated to 30 Gy in 10 fractions  . Hyperlipidemia   . Hypertension   . Iron deficiency anemia due to chronic blood loss 08/08/2017  . Low testosterone   . Nephrolithiasis   . Osteoarthritis    shoulder  . Restless leg   . SVT (supraventricular tachycardia) ([place])   . Urinary frequency   . Wears partial dentures    upper and lower    Patient Active Problem List   Diagnosis Date Noted  . Iron deficiency anemia due to chronic blood loss 08/08/2017  . Sepsis secondary to UTI ([place]) 06/17/2017  . Thrombocytopenia ([place]) 06/17/2017  . Acute urinary retention 06/17/2017  . Sepsis ([place]) 06/06/2017  . Anemia in chronic renal disease 05/07/2017  . Long term (current) use of anticoagulants 12/10/2016  . V-tach ([place]) 07/02/2016  . Protein-calorie malnutrition, severe 07/02/2016  . Bilateral lower extremity edema 07/01/2016  . Bilateral leg edema 06/11/2016  . 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 Lumbosacral spondylosis with radiculopathy 02/07/2015  . At high risk for falls 05/05/2014  . Encounter for therapeutic drug monitoring 03/15/2013  . Chest pain 02/03/2013  . Obesity (BMI 30-39.9) 09/21/2012  . Poorly controlled type 2 diabetes mellitus with neuropathy ([place]) 07/03/2012  . Chronic kidney disease, stage III (moderate) ([place]) 07/03/2012  . Restless leg syndrome 12/04/2011  .  Long term current use of anticoagulant therapy 07/15/2011  . Paroxysmal atrial fibrillation ([place]) 06/21/2011  . Upper extremity weakness 06/21/2011  . Midsternal chest pain 06/19/2011  . Coronary artery disease   . Hypertension   . Hyperlipidemia   . COPD (chronic obstructive pulmonary disease) ([place])   . GERD (gastroesophageal reflux disease)   . Peripheral neuropathy   . Dyslipidemia 03/27/2011  . Diabetic peripheral neuropathy ([place]) 12/17/2010  . Hypotestosteronism 05/07/2010  . LEG CRAMPS, IDIOPATHIC 08/14/2009  . Insomnia 08/14/2009  .  Malignant neoplasm of skin of face 08/14/2009  . OBESITY 08/02/2009  . HYPERTENSION, HEART CONTROLLED W/O ASSOC CHF 03/28/2008  . CAD, NATIVE VESSEL 03/28/2008  . G E R D 01/12/2007  . Morbid obesity ([place]) 09/15/2006  . Hyperlipidemia, mixed 07/01/2006  . Essential hypertension 07/01/2006  . Coronary atherosclerosis 07/01/2006  . COPD 07/01/2006  . OSTEOARTHRITIS 07/01/2006    Past Surgical History:  Procedure Laterality Date  . APPENDECTOMY    . [place]  . CARDIAC CATHETERIZATION  01/2013  . CATARACT EXTRACTION, BILATERAL    . CHOLECYSTECTOMY    . COLONOSCOPY    . CORONARY ANGIOPLASTY  2004  . CYSTOSCOPY N/A 08/18/2017   Procedure: CYSTOSCOPY WITH FULGURATION AND SUPRA PUBIC TUBE PLACEMENT;  Surgeon: Kathie Rhodes, MD;  Location: [facility] ORS;  Service: Urology;  Laterality: N/A;  . EYE SURGERY Bilateral    cataracts  . IR GENERIC HISTORICAL  10/05/2015   IR US GUIDE VASC ACCESS RIGHT 10/05/2015 Marybelle Killings, MD [facility]INTER[facility]  . IR GENERIC HISTORICAL  10/05/2015   IR FLUORO GUIDE PORT INSERTION RIGHT 10/05/2015 Marybelle Killings, MD [facility]  . LEFT HEART CATHETERIZATION WITH CORONARY ANGIOGRAM N/A 06/18/2011   Procedure: LEFT HEART CATHETERIZATION WITH CORONARY ANGIOGRAM;  Surgeon: Peter M Martinique, MD;  Location: [facility] CATH LAB;  Service: Cardiovascular;  Laterality: N/A;  . LEFT HEART CATHETERIZATION WITH CORONARY ANGIOGRAM N/A 01/27/2013   Procedure: LEFT HEART CATHETERIZATION WITH CORONARY ANGIOGRAM;  Surgeon: Burnell Blanks, MD;  Location: [facility] CATH LAB;  Service: Cardiovascular;  Laterality: N/A;  . LUMBAR LAMINECTOMY/DECOMPRESSION MICRODISCECTOMY N/A 02/07/2015   Procedure: Lumbar three-Sacral one Decompression;  Surgeon: Kevan Ny Ditty, MD;  Location: [facility] NEURO ORS;  Service: Neurosurgery;  Laterality: N/A;  L3 to S1 Decompression  . MULTIPLE TOOTH EXTRACTIONS    . ORCHIECTOMY Right 09/01/2015   Procedure: RIGHT ORCHIECTOMY;  Surgeon: Kathie Rhodes, MD;  Location:  [facility] ORS;  Service: Urology;  Laterality: Right;  . port a cath in place     . ROTATOR CUFF REPAIR Left         Home Medications    Prior to Admission medications   Medication Sig Start Date End Date Taking? Authorizing Provider  ACCU-CHEK AVIVA PLUS test strip TEST 3 TIMES A DAY Patient taking differently: TEST 2 TIMES A DAY 10/27/15   [name], Alinda Sierras, MD  acetaminophen (TYLENOL) 500 MG tablet Take 1,000 mg by mouth every 6 (six) hours as needed (for pain/fever/headaches.).     [provider]  atorvastatin (LIPITOR) 40 MG tablet TAKE 1 TABLET BY MOUTH EVERY DAY 05/30/17   [name], Alinda Sierras, MD  ciprofloxacin (CIPRO) 500 MG tablet Take 1 tablet (500 mg total) by mouth 2 (two) times daily. 08/18/17   Kathie Rhodes, MD  darifenacin (ENABLEX) 7.5 MG 24 hr tablet Take 1 tablet (7.5 mg total) by mouth daily. 06/21/17   Doreatha Lew, MD  docusate sodium (COLACE) 50 MG capsule Take 1  capsule (50 mg total) by mouth 2 (two) times daily as needed (for constipation). 04/22/16   [name], Alinda Sierras, MD  enoxaparin (LOVENOX) 150 MG/ML injection Inject 1 mL (150 mg total) into the skin daily. 08/08/17   [name], Alinda Sierras, MD  gabapentin (NEURONTIN) 300 MG capsule TAKE 2 CAPSULES BY MOUTH 3 TIMES A DAY Patient taking differently: TAKE 3 CAPSULES BY MOUTH 3 TIMES A DAY 04/28/17   [name], Alinda Sierras, MD  HYDROcodone-acetaminophen (NORCO) 10-325 MG tablet Take 1-2 tablets by mouth every 4 (four) hours as needed for moderate pain. Maximum dose per 24 hours - 8 pills 08/18/17   Kathie Rhodes, MD  KLOR-CON M20 20 MEQ tablet TAKE 1 TABLET BY MOUTH EVERY DAY 08/18/17   [name], Alinda Sierras, MD  magic mouthwash w/lidocaine SOLN Take 5 mLs by mouth 4 (four) times daily as needed for mouth pain. 1:1 ratio. DIPHENHYDRAMINE HCL (ANTIHISTAMINES - ETHANOLAMINES),ALUM & MAG HYDROXIDE-SIMETH,NYSTATIN (ANTI-INFECTIVES - THROAT),LIDOCAINE HCL (ANESTHETICS TOPICAL ORAL) 04/01/17   [place], Holli Humbles, NP  Melatonin 1  MG TABS Take 1 tablet by mouth at bedtime.     [provider]  metFORMIN (GLUCOPHAGE) 500 MG tablet TAKE 1 TABLET BY MOUTH TWICE A DAY WITH A MEAL 09/16/16   [name], Alinda Sierras, MD  metoprolol succinate (TOPROL-XL) 100 MG 24 hr tablet TAKE 1 TABLET BY MOUTH EVERY DAY IMMEDIATELY FOLLOWING A MEAL 08/20/17   [name], Alinda Sierras, MD  Multiple Vitamins-Minerals (MULTIVITAMIN WITH MINERALS) tablet Take 1 tablet by mouth daily.    [provider]  nitroGLYCERIN (NITROSTAT) 0.4 MG SL tablet Place 1 tablet (0.4 mg total) under the tongue every 5 (five) minutes as needed. Chest pain 01/20/14   Burnell Blanks, MD  pantoprazole (PROTONIX) 40 MG tablet Take 40 mg by mouth daily.    [provider]  phenazopyridine (PYRIDIUM) 95 MG tablet Take 95 mg by mouth 3 (three) times daily as needed for pain. Reports he take 2 tablets TID    [provider]  polyethylene glycol (MIRALAX / GLYCOLAX) packet Take 17 g by mouth daily. Patient taking differently: Take 17 g by mouth daily as needed for moderate constipation.  06/20/17   Doreatha Lew, MD  pramipexole (MIRAPEX) 1.5 MG tablet Take two tablets every night. Patient taking differently: Take 3 mg by mouth at bedtime. Take two tablets every night. 09/10/16   [name], Alinda Sierras, MD  TOUJEO SOLOSTAR 300 UNIT/ML SOPN INJECT 20 UNITS INTO THE SKIN DAILY AFTER BREAKFAST. 09/24/16   [name], Alinda Sierras, MD  vitamin B-12 (CYANOCOBALAMIN) 1000 MCG tablet Take 1 tablet (1,000 mcg total) by mouth daily. 06/20/17   Doreatha Lew, MD  warfarin (COUMADIN) 5 MG tablet Take as directed by anticoagulation clinic Patient taking differently: Take 5-7.5 mg by mouth as directed. 5 mg Monday, Wed, and Fri, and 7.5 mg  All other days 05/28/17   [name], MD  metoprolol (LOPRESSOR) 50 MG tablet  06/08/10 03/27/11  [provider]  spironolactone (ALDACTONE) 25 MG tablet  01/14/11 03/27/11  [provider]    Family  History Family History  Problem Relation Age of Onset  . Alzheimer's disease Mother   . Heart disease Mother   . Heart disease Father   . Migraines Father   . Ulcers Father   . Prostate cancer Brother   . Coronary artery disease Unknown        Male 1st degree relative <50  . Coronary artery disease Unknown  male 1st degree relative <60  . Heart disease Sister   . Obesity Sister        Morbid  . Arthritis Sister   . Heart disease Brother   . Arthritis Brother   . Sleep apnea Son   . Obesity Son   . Migraines Daughter   . Thyroid disease Daughter     Social History Social History   Tobacco Use  . Smoking status: Former Smoker    Packs/day: 1.50    Years: 20.00    Pack years: 30.00    Types: Cigarettes    Last attempt to quit: 04/05/1978    Years since quitting: 39.4  . Smokeless tobacco: Never Used  Substance Use Topics  . Alcohol use: No  . Drug use: No     Allergies   Ace inhibitors; Codeine; and Penicillins   Review of Systems Review of Systems  Constitutional: Negative for chills and fever.  HENT: Negative for congestion and facial swelling.   Eyes: Negative for discharge and visual disturbance.  Respiratory: Negative for shortness of breath.   Cardiovascular: Negative for chest pain and palpitations.  Gastrointestinal: Negative for abdominal pain, diarrhea and vomiting.  Genitourinary: Positive for difficulty urinating.  Musculoskeletal: Negative for arthralgias and myalgias.  Skin: Negative for color change and rash.  Neurological: Negative for tremors, syncope and headaches.  Psychiatric/Behavioral: Negative for confusion and dysphoric mood.     Physical Exam Updated Vital Signs BP 112/63 (BP Location: Right Arm)   Pulse 77   Temp 98.2 F (36.8 C) (Oral)   Resp 16   Ht 5\' 10"  (1.778 m)   Wt 97.1 kg   SpO2 99%   BMI 30.71 kg/m   Physical Exam  Constitutional: He is oriented to person, place, and time. He appears well-developed  and well-nourished.  HENT:  Head: Normocephalic and atraumatic.  Eyes: Pupils are equal, round, and reactive to light. EOM are normal.  Neck: Normal range of motion. Neck supple. No JVD present.  Cardiovascular: Normal rate and regular rhythm. Exam reveals no gallop and no friction rub.  No murmur heard. Pulmonary/Chest: No respiratory distress. He has no wheezes.  Abdominal: He exhibits no distension and no mass. There is no tenderness. There is no rebound and no guarding.  Musculoskeletal: Normal range of motion.  Neurological: He is alert and oriented to person, place, and time.  Skin: No rash noted. No pallor.  Psychiatric: He has a normal mood and affect. His behavior is normal.  Nursing note and vitals reviewed.    ED Treatments / Results  Labs (all labs ordered are listed, but only abnormal results are displayed) Labs Reviewed - No data to display  EKG None  Radiology No results found.  Procedures Procedures (including critical care time)  Medications Ordered in ED Medications - No data to display   Initial Impression / Assessment and Plan / ED Course  I have reviewed the triage vital signs and the nursing notes.  Pertinent labs & imaging results that were available during my care of the patient were reviewed by me and considered in my medical decision making (see chart for details).     84 yo M with a cc of urinary retention.  Recent placement of a suprapubic catheter. Cleared with flushing. D/c home.   11:38 PM:  I have discussed the diagnosis/risks/treatment options with the patient and family and believe the pt to be eligible for discharge home to follow-up with Urology. We also discussed returning to  the ED immediately if new or worsening sx occur. We discussed the sx which are most concerning (e.g., sudden worsening pain, fever, inability to tolerate by mouth) that necessitate immediate return. Medications administered to the patient during their visit and any  new prescriptions provided to the patient are listed below.  Medications given during this visit Medications - No data to display    The patient appears reasonably screen and/or stabilized for discharge and I doubt any other medical condition or other [facility] requiring further screening, evaluation, or treatment in the ED at this time prior to discharge.    Final Clinical Impressions(s) / ED Diagnoses   Final diagnoses:  Urinary retention    ED Discharge Orders    None       Deno Etienne, DO 08/22/17 [id number]

## 2017-08-23 ENCOUNTER — Encounter (HOSPITAL_COMMUNITY): Payer: Self-pay

## 2017-08-23 ENCOUNTER — Emergency Department (HOSPITAL_COMMUNITY)
Admission: EM | Admit: 2017-08-23 | Discharge: 2017-08-23 | Disposition: A | Discharge status: Home / Self Care | Payer: Medicare Other | Attending: Emergency Medicine | Admitting: Emergency Medicine

## 2017-08-23 DIAGNOSIS — Z79899 Other long term (current) drug therapy: Secondary | ICD-10-CM | POA: Insufficient documentation

## 2017-08-23 DIAGNOSIS — R338 Other retention of urine: Secondary | ICD-10-CM | POA: Diagnosis not present

## 2017-08-23 DIAGNOSIS — Z87891 Personal history of nicotine dependence: Secondary | ICD-10-CM | POA: Insufficient documentation

## 2017-08-23 DIAGNOSIS — J449 Chronic obstructive pulmonary disease, unspecified: Secondary | ICD-10-CM | POA: Diagnosis not present

## 2017-08-23 DIAGNOSIS — T83090A Other mechanical complication of cystostomy catheter, initial encounter: Secondary | ICD-10-CM | POA: Insufficient documentation

## 2017-08-23 DIAGNOSIS — N183 Chronic kidney disease, stage 3 (moderate): Secondary | ICD-10-CM | POA: Insufficient documentation

## 2017-08-23 DIAGNOSIS — Z7901 Long term (current) use of anticoagulants: Secondary | ICD-10-CM | POA: Diagnosis not present

## 2017-08-23 DIAGNOSIS — Z7984 Long term (current) use of oral hypoglycemic drugs: Secondary | ICD-10-CM | POA: Insufficient documentation

## 2017-08-23 DIAGNOSIS — I129 Hypertensive chronic kidney disease with stage 1 through stage 4 chronic kidney disease, or unspecified chronic kidney disease: Secondary | ICD-10-CM | POA: Diagnosis not present

## 2017-08-23 DIAGNOSIS — Y828 Other medical devices associated with adverse incidents: Secondary | ICD-10-CM | POA: Insufficient documentation

## 2017-08-23 DIAGNOSIS — E1122 Type 2 diabetes mellitus with diabetic chronic kidney disease: Secondary | ICD-10-CM | POA: Diagnosis not present

## 2017-08-23 DIAGNOSIS — I2510 Atherosclerotic heart disease of native coronary artery without angina pectoris: Secondary | ICD-10-CM | POA: Insufficient documentation

## 2017-08-23 LAB — BASIC METABOLIC PANEL
Anion gap: 6 (ref 5–15)
BUN: 15 mg/dL (ref 8–23)
CALCIUM: 8.5 mg/dL — AB (ref 8.9–10.3)
CO2: 28 mmol/L (ref 22–32)
CREATININE: 1 mg/dL (ref 0.61–1.24)
Chloride: 105 mmol/L (ref 98–111)
GFR calc non Af Amer: >60 mL/min (ref >60)
Glucose, Bld: 124 mg/dL — ABNORMAL HIGH (ref 70–99)
Potassium: 4.1 mmol/L (ref 3.5–5.1)
SODIUM: 139 mmol/L (ref 135–145)

## 2017-08-23 LAB — URINALYSIS, ROUTINE W REFLEX MICROSCOPIC
BILIRUBIN URINE: NEGATIVE
Glucose, UA: NEGATIVE mg/dL
Ketones, ur: NEGATIVE mg/dL
NITRITE: NEGATIVE
PH: 6 (ref 5.0–8.0)
Protein, ur: NEGATIVE mg/dL
RBC / HPF: >50 RBC/hpf — ABNORMAL HIGH (ref 0–5)
SPECIFIC GRAVITY, URINE: 1.005 (ref 1.005–1.030)

## 2017-08-23 LAB — CBC
HCT: 27.4 % — ABNORMAL LOW (ref 39.0–52.0)
Hemoglobin: 9 g/dL — ABNORMAL LOW (ref 13.0–17.0)
MCH: 32.3 pg (ref 26.0–34.0)
MCHC: 32.8 g/dL (ref 30.0–36.0)
MCV: 98.2 fL (ref 78.0–100.0)
PLATELETS: 102 10*3/uL — AB (ref 150–400)
RBC: 2.79 MIL/uL — ABNORMAL LOW (ref 4.22–5.81)
RDW: 17.3 % — AB (ref 11.5–15.5)
WBC: 2.8 10*3/uL — ABNORMAL LOW (ref 4.0–10.5)

## 2017-08-23 NOTE — ED Provider Notes (Signed)
[facility] DEPT Provider Note   CSN: [id number] Arrival date & time: 08/23/17  1021     History   Chief Complaint Chief Complaint  Patient presents with  . Catheter issues    HPI Gary Yates. is a 84 y.o. male.  HPI Patient presented to the emergency room for evaluation of applications associated with his suprapubic catheter.  Patient had a suprapubic catheter placed this past Monday.  Initially it seemed to be draining well without any issues.  Patient presented to the emergency room last evening because of decreased urine output.  Patient was noted to have obstruction of his suprapubic catheter.  The catheter was irrigated and then started draining properly.  Patient states this morning he noticed that the catheter again was not draining properly.  The bag was empty and he occasionally is leaking out through his penis.  He denies any fevers or chills.  He does feel full in his bladder.  He called the urologist and was instructed to come to the emergency room. Past Medical History:  Diagnosis Date  . Anemia in chronic renal disease 05/07/2017  . Anxiety   . Atrial fibrillation ([place])   . COPD (chronic obstructive pulmonary disease) ([place])    pt. denies  . Coronary artery disease    a. h/o Overlapping stents RCA;  b. 06/2011 Cath: patent stents, nonobs dzs, NL EF.  . Diabetic peripheral neuropathy ([place])   . Diffuse non-Hodgkin's lymphoma of testis ([place]) 09/28/2015  . DM (diabetes mellitus) ([place])    Type 2, peripheral neuropathy.  . Dyspnea    with exertion  . Dysrhythmia   . GERD (gastroesophageal reflux disease)   . Headache   . History of bronchitis   . History of kidney stones   . History of radiation therapy 02/19/16 - 03/13/16   Testis/Scrotum: 32.4 Gy in 18 fractions  . History of radiation therapy 08/07/16-08/20/16   left adrenal gland mass treated to 30 Gy in 10 fractions  . Hyperlipidemia   . Hypertension   . Iron deficiency anemia due  to chronic blood loss 08/08/2017  . Low testosterone   . Nephrolithiasis   . Osteoarthritis    shoulder  . Restless leg   . SVT (supraventricular tachycardia) ([place])   . Urinary frequency   . Wears partial dentures    upper and lower    Patient Active Problem List   Diagnosis Date Noted  . Iron deficiency anemia due to chronic blood loss 08/08/2017  . Sepsis secondary to UTI ([place]) 06/17/2017  . Thrombocytopenia ([place]) 06/17/2017  . Acute urinary retention 06/17/2017  . Sepsis ([place]) 06/06/2017  . Anemia in chronic renal disease 05/07/2017  . Long term (current) use of anticoagulants 12/10/2016  . V-tach ([place]) 07/02/2016  . Protein-calorie malnutrition, severe 07/02/2016  . Bilateral lower extremity edema 07/01/2016  . Bilateral leg edema 06/11/2016  . 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 Lumbosacral spondylosis with radiculopathy 02/07/2015  . At high risk for falls 05/05/2014  . Encounter for therapeutic drug monitoring 03/15/2013  . Chest pain 02/03/2013  . Obesity (BMI 30-39.9) 09/21/2012  . Poorly controlled type 2 diabetes mellitus with neuropathy ([place]) 07/03/2012  . Chronic kidney disease, stage III (moderate) ([place]) 07/03/2012  . Restless leg syndrome 12/04/2011  . Long term current use of anticoagulant therapy 07/15/2011  . Paroxysmal atrial fibrillation ([place]) 06/21/2011  . Upper extremity weakness 06/21/2011  . Midsternal chest pain 06/19/2011  . Coronary artery disease   .  Hypertension   . Hyperlipidemia   . COPD (chronic obstructive pulmonary disease) ([place])   . GERD (gastroesophageal reflux disease)   . Peripheral neuropathy   . Dyslipidemia 03/27/2011  . Diabetic peripheral neuropathy ([place]) 12/17/2010  . Hypotestosteronism 05/07/2010  . LEG CRAMPS, IDIOPATHIC 08/14/2009  . Insomnia 08/14/2009  . Malignant neoplasm of skin of face 08/14/2009  . OBESITY 08/02/2009  . HYPERTENSION, HEART CONTROLLED W/O ASSOC CHF 03/28/2008  . CAD, NATIVE  VESSEL 03/28/2008  . G E R D 01/12/2007  . Morbid obesity ([place]) 09/15/2006  . Hyperlipidemia, mixed 07/01/2006  . Essential hypertension 07/01/2006  . Coronary atherosclerosis 07/01/2006  . COPD 07/01/2006  . OSTEOARTHRITIS 07/01/2006    Past Surgical History:  Procedure Laterality Date  . APPENDECTOMY    . [place]  . CARDIAC CATHETERIZATION  01/2013  . CATARACT EXTRACTION, BILATERAL    . CHOLECYSTECTOMY    . COLONOSCOPY    . CORONARY ANGIOPLASTY  2004  . CYSTOSCOPY N/A 08/18/2017   Procedure: CYSTOSCOPY WITH FULGURATION AND SUPRA PUBIC TUBE PLACEMENT;  Surgeon: Kathie Rhodes, MD;  Location: [facility] ORS;  Service: Urology;  Laterality: N/A;  . EYE SURGERY Bilateral    cataracts  . IR GENERIC HISTORICAL  10/05/2015   IR US GUIDE VASC ACCESS RIGHT 10/05/2015 Marybelle Killings, MD [facility]-INTER[facility] RAD  . IR GENERIC HISTORICAL  10/05/2015   IR FLUORO GUIDE PORT INSERTION RIGHT 10/05/2015 Marybelle Killings, MD [facility]  . LEFT HEART CATHETERIZATION WITH CORONARY ANGIOGRAM N/A 06/18/2011   Procedure: LEFT HEART CATHETERIZATION WITH CORONARY ANGIOGRAM;  Surgeon: Peter M Martinique, MD;  Location: [facility] CATH LAB;  Service: Cardiovascular;  Laterality: N/A;  . LEFT HEART CATHETERIZATION WITH CORONARY ANGIOGRAM N/A 01/27/2013   Procedure: LEFT HEART CATHETERIZATION WITH CORONARY ANGIOGRAM;  Surgeon: Burnell Blanks, MD;  Location: [facility] CATH LAB;  Service: Cardiovascular;  Laterality: N/A;  . LUMBAR LAMINECTOMY/DECOMPRESSION MICRODISCECTOMY N/A 02/07/2015   Procedure: Lumbar three-Sacral one Decompression;  Surgeon: Kevan Ny Ditty, MD;  Location: [facility] NEURO ORS;  Service: Neurosurgery;  Laterality: N/A;  L3 to S1 Decompression  . MULTIPLE TOOTH EXTRACTIONS    . ORCHIECTOMY Right 09/01/2015   Procedure: RIGHT ORCHIECTOMY;  Surgeon: Kathie Rhodes, MD;  Location: [facility] ORS;  Service: Urology;  Laterality: Right;  . port a cath in place     . ROTATOR CUFF REPAIR Left         Home Medications     Prior to Admission medications   Medication Sig Start Date End Date Taking? Authorizing Provider  ACCU-CHEK AVIVA PLUS test strip TEST 3 TIMES A DAY Patient taking differently: TEST 2 TIMES A DAY 10/27/15  Yes [name], Alinda Sierras, MD  acetaminophen (TYLENOL) 500 MG tablet Take 1,000 mg by mouth every 6 (six) hours as needed (for pain/fever/headaches.).    Yes [provider]  atorvastatin (LIPITOR) 40 MG tablet TAKE 1 TABLET BY MOUTH EVERY DAY 05/30/17  Yes [name], Alinda Sierras, MD  ciprofloxacin (CIPRO) 500 MG tablet Take 1 tablet (500 mg total) by mouth 2 (two) times daily. 08/18/17  Yes Kathie Rhodes, MD  gabapentin (NEURONTIN) 300 MG capsule TAKE 2 CAPSULES BY MOUTH 3 TIMES A DAY Patient taking differently: TAKE 3 CAPSULES BY MOUTH 3 TIMES A DAY 04/28/17  Yes [name], Alinda Sierras, MD  HYDROcodone-acetaminophen (NORCO) 10-325 MG tablet Take 1-2 tablets by mouth every 4 (four) hours as needed for moderate pain. Maximum dose per 24 hours - 8 pills 08/18/17  Yes Kathie Rhodes, MD  magic mouthwash w/lidocaine SOLN Take 5 mLs by mouth 4 (four) times daily as needed for mouth pain. 1:1 ratio. DIPHENHYDRAMINE HCL (ANTIHISTAMINES - ETHANOLAMINES),ALUM & MAG HYDROXIDE-SIMETH,NYSTATIN (ANTI-INFECTIVES - THROAT),LIDOCAINE HCL (ANESTHETICS TOPICAL ORAL) 04/01/17  Yes [place], Holli Humbles, NP  Melatonin 1 MG TABS Take 1 tablet by mouth at bedtime.    Yes [provider]  metFORMIN (GLUCOPHAGE) 500 MG tablet TAKE 1 TABLET BY MOUTH TWICE A DAY WITH A MEAL 09/16/16  Yes [name], Alinda Sierras, MD  metoprolol succinate (TOPROL-XL) 100 MG 24 hr tablet TAKE 1 TABLET BY MOUTH EVERY DAY IMMEDIATELY FOLLOWING A MEAL 08/20/17  Yes [name], Alinda Sierras, MD  Multiple Vitamins-Minerals (MULTIVITAMIN WITH MINERALS) tablet Take 1 tablet by mouth daily.   Yes [provider]  nitroGLYCERIN (NITROSTAT) 0.4 MG SL tablet Place 1 tablet (0.4 mg total) under the tongue every 5 (five) minutes as needed. Chest pain  01/20/14  Yes Burnell Blanks, MD  pantoprazole (PROTONIX) 40 MG tablet Take 40 mg by mouth daily.   Yes [provider]  pramipexole (MIRAPEX) 1.5 MG tablet Take two tablets every night. Patient taking differently: Take 3 mg by mouth at bedtime. Take two tablets every night. 09/10/16  Yes [name], Alinda Sierras, MD  TOUJEO SOLOSTAR 300 UNIT/ML SOPN INJECT 20 UNITS INTO THE SKIN DAILY AFTER BREAKFAST. 09/24/16  Yes [name], Alinda Sierras, MD  vitamin B-12 (CYANOCOBALAMIN) 1000 MCG tablet Take 1 tablet (1,000 mcg total) by mouth daily. 06/20/17  Yes Patrecia Pour, Christean Grief, MD  warfarin (COUMADIN) 5 MG tablet Take as directed by anticoagulation clinic Patient taking differently: Take 5-7.5 mg by mouth as directed. 5 mg Monday, Wed, and Fri, and 7.5 mg  All other days 05/28/17  Yes [name], Alinda Sierras, MD  darifenacin (ENABLEX) 7.5 MG 24 hr tablet Take 1 tablet (7.5 mg total) by mouth daily. Patient not taking: Reported on 08/23/2017 06/21/17   Patrecia Pour, Christean Grief, MD  docusate sodium (COLACE) 50 MG capsule Take 1 capsule (50 mg total) by mouth 2 (two) times daily as needed (for constipation). Patient not taking: Reported on 08/23/2017 04/22/16   [name], MD  enoxaparin (LOVENOX) 150 MG/ML injection Inject 1 mL (150 mg total) into the skin daily. Patient not taking: Reported on 08/23/2017 08/08/17   [name], MD  KLOR-CON M20 20 MEQ tablet TAKE 1 TABLET BY MOUTH EVERY DAY 08/18/17   [name], Alinda Sierras, MD  phenazopyridine (PYRIDIUM) 95 MG tablet Take 95 mg by mouth 3 (three) times daily as needed for pain. Reports he take 2 tablets TID    [provider]  polyethylene glycol (MIRALAX / GLYCOLAX) packet Take 17 g by mouth daily. Patient not taking: Reported on 08/23/2017 06/20/17   Patrecia Pour, Christean Grief, MD  tamsulosin [facility]) 0.4 MG CAPS capsule Take 0.4 mg by mouth daily. 08/20/17   [provider]  metoprolol (LOPRESSOR) 50 MG tablet  06/08/10 03/27/11  [provider]  spironolactone (ALDACTONE) 25 MG tablet  01/14/11 03/27/11  [provider]    Family History Family History  Problem Relation Age of Onset  . Alzheimer's disease Mother   . Heart disease Mother   . Heart disease Father   . Migraines Father   . Ulcers Father   . Prostate cancer Brother   . Coronary artery disease Unknown        Male 1st degree relative <50  . Coronary artery disease Unknown        male 1st degree  relative <60  . Heart disease Sister   . Obesity Sister        Morbid  . Arthritis Sister   . Heart disease Brother   . Arthritis Brother   . Sleep apnea Son   . Obesity Son   . Migraines Daughter   . Thyroid disease Daughter     Social History Social History   Tobacco Use  . Smoking status: Former Smoker    Packs/day: 1.50    Years: 20.00    Pack years: 30.00    Types: Cigarettes    Last attempt to quit: 04/05/1978    Years since quitting: 39.4  . Smokeless tobacco: Never Used  Substance Use Topics  . Alcohol use: No  . Drug use: No     Allergies   Ace inhibitors; Codeine; and Penicillins   Review of Systems Review of Systems  All other systems reviewed and are negative.    Physical Exam Updated Vital Signs BP 128/85   Pulse 85   Temp 98 F (36.7 C)   Resp 19   SpO2 98%   Physical Exam  Constitutional: He appears well-developed and well-nourished. No distress.  HENT:  Head: Normocephalic and atraumatic.  Right Ear: External ear normal.  Left Ear: External ear normal.  Eyes: Conjunctivae are normal. Right eye exhibits no discharge. Left eye exhibits no discharge. No scleral icterus.  Neck: Neck supple. No tracheal deviation present.  Cardiovascular: Normal rate, regular rhythm and normal heart sounds.  Pulmonary/Chest: Effort normal and breath sounds normal. No stridor. No respiratory distress.  Abdominal: He exhibits no distension. There is no tenderness.  Fullness suprapubic region  Musculoskeletal: He exhibits no  edema.  Neurological: He is alert. Cranial nerve deficit: no gross deficits.  Skin: Skin is warm and dry. No rash noted.  Psychiatric: He has a normal mood and affect.  Nursing note and vitals reviewed.    ED Treatments / Results  Labs (all labs ordered are listed, but only abnormal results are displayed) Labs Reviewed  CBC - Abnormal; Notable for the following components:      Result Value   WBC 2.8 (*)    RBC 2.79 (*)    Hemoglobin 9.0 (*)    HCT 27.4 (*)    RDW 17.3 (*)    Platelets 102 (*)    All other components within normal limits  BASIC METABOLIC PANEL - Abnormal; Notable for the following components:   Glucose, Bld 124 (*)    Calcium 8.5 (*)    All other components within normal limits  URINALYSIS, ROUTINE W REFLEX MICROSCOPIC - Abnormal; Notable for the following components:   Color, Urine STRAW (*)    Hgb urine dipstick LARGE (*)    Leukocytes, UA MODERATE (*)    RBC / HPF >50 (*)    Bacteria, UA RARE (*)    All other components within normal limits    EKG None  Radiology No results found.  Procedures Procedures (including critical care time)  Medications Ordered in ED Medications - No data to display   Initial Impression / Assessment and Plan / ED Course  I have reviewed the triage vital signs and the nursing notes.  Pertinent labs & imaging results that were available during my care of the patient were reviewed by me and considered in my medical decision making (see chart for details).  Clinical Course as of Aug 24 1603  Sat Aug 23, 2017  1603 Anemia is stable.  Renal function  is normal.   [JK]    Clinical Course User Index [JK] Dorie Rank, MD    Pt  presented to the emergency room for evaluation of an obstructed urinary catheter.  the is catheter was irrigated in the emergency room.  The obstruction was relieved and the patient has been urinating normally.  Was a small amount of sediment in the catheter bag but his urine is clear now.   Patient is stable for discharge.  Final Clinical Impressions(s) / ED Diagnoses   Final diagnoses:  Obstructed suprapubic catheter, initial encounter [facility])    ED Discharge Orders    None       Dorie Rank, MD 08/23/17 [phone]

## 2017-08-23 NOTE — ED Notes (Signed)
Cath has been irrigated and is producing urine flow.

## 2017-08-23 NOTE — ED Notes (Signed)
ED Provider at bedside. 

## 2017-08-23 NOTE — Discharge Instructions (Addendum)
Follow-up with your urologist, you can try irrigating the catheter to be discussed if you have any recurrent episodes of obstruction, return to the ED if you are unable to relieve the obstruction

## 2017-08-23 NOTE — ED Notes (Signed)
Scanned bladder and 0 ml was in there

## 2017-08-23 NOTE — ED Triage Notes (Signed)
Pt presents with c/o catheter issues. Pt reports he has been having issues with urinary retention, follows a urologist, has had a catheter in place since Monday. Pt reports that is hasn't been draining well since a day or so after the original placement. Pt was here last night and had the catheter irrigated but has stopped draining since he was discharged early this morning.

## 2017-08-23 NOTE — ED Notes (Signed)
I gave patient a sandwich per nurse

## 2017-08-25 ENCOUNTER — Ambulatory Visit (INDEPENDENT_AMBULATORY_CARE_PROVIDER_SITE_OTHER): Payer: Medicare Other | Admitting: General Practice

## 2017-08-25 DIAGNOSIS — Z7901 Long term (current) use of anticoagulants: Secondary | ICD-10-CM

## 2017-08-25 DIAGNOSIS — I480 Paroxysmal atrial fibrillation: Secondary | ICD-10-CM

## 2017-08-25 LAB — POCT INR: INR: 1.3 — AB (ref 2.0–3.0)

## 2017-08-25 NOTE — Patient Instructions (Signed)
Pre visit review using our clinic review tool, if applicable. No additional management support is needed unless otherwise documented below in the visit note.  Take 7.5 mg today, 10 mg tomorrow and 7.5 mg on Wed and Thursday.  Re-check on Friday.  Patient has been off coumadin for procedure.  Dosing instructions given to Dorian Pod, RN @ [facility].

## 2017-08-29 ENCOUNTER — Other Ambulatory Visit: Payer: Self-pay | Admitting: Family Medicine

## 2017-08-29 ENCOUNTER — Ambulatory Visit (INDEPENDENT_AMBULATORY_CARE_PROVIDER_SITE_OTHER): Payer: Medicare Other | Admitting: General Practice

## 2017-08-29 DIAGNOSIS — I480 Paroxysmal atrial fibrillation: Secondary | ICD-10-CM

## 2017-08-29 DIAGNOSIS — Z7901 Long term (current) use of anticoagulants: Secondary | ICD-10-CM | POA: Diagnosis not present

## 2017-08-29 LAB — POCT INR: INR: 1.9 — AB (ref 2.0–3.0)

## 2017-08-29 NOTE — Patient Instructions (Signed)
Pre visit review using our clinic review tool, if applicable. No additional management support is needed unless otherwise documented below in the visit note.  Take 7.5 mg today and then continue to take 7.5 daily except 5 mg on Monday/Wed/Fridays.  Re-check in 1 week. Dosing instructions given to Olivia Mackie, RN @ [facility].

## 2017-09-05 ENCOUNTER — Ambulatory Visit (INDEPENDENT_AMBULATORY_CARE_PROVIDER_SITE_OTHER): Payer: Medicare Other | Admitting: General Practice

## 2017-09-05 DIAGNOSIS — I480 Paroxysmal atrial fibrillation: Secondary | ICD-10-CM

## 2017-09-05 DIAGNOSIS — Z7901 Long term (current) use of anticoagulants: Secondary | ICD-10-CM | POA: Diagnosis not present

## 2017-09-05 LAB — POCT INR: INR: 2.1 (ref 2.0–3.0)

## 2017-09-05 NOTE — Patient Instructions (Signed)
Pre visit review using our clinic review tool, if applicable. No additional management support is needed unless otherwise documented below in the visit note.  Take 7.5 mg today and then continue to take 7.5 daily except 5 mg on Monday/Wed/Fridays.  Re-check in 1 week. Dosing instructions given to Dorian Pod, RN @ [facility].

## 2017-09-09 ENCOUNTER — Other Ambulatory Visit: Payer: Self-pay | Admitting: Family Medicine

## 2017-09-10 NOTE — Progress Notes (Signed)
=   Chief Complaint  Patient presents with  . Follow-up    CAD     History of Present Illness: 84 yo male with history of HTN, hyperlipidemia, morbid obesity, DM, CAD, atrial fibrillation, carotid artery disease and COPD who is here for follow up. He has CAD with remote stenting of the RCA in 2004. He was admitted to [facility] June 2013 with chest pain. Cardiac cath on 06/18/11 with moderate LAD disease with patent RCA stents with no flow limiting lesions. Normal LV function by cath. He was found to be in atrial fibrillation. He was started on Xarelto but changed to coumadin due to cost. Slurred speech in June 2013. Head CT without evidence of CVA. Head MRI with questionable tiny acute infarct posterior left frontal lobe, mild small vessel disease type changes, global atrophy without hydrocephalus. He is known to have very mild bilateral carotid artery disease (last dopplers November 2016). He was seen 12/23/12 and had c/o chest pressure when working in the yard. Stress myoview 01/13/13 with small inferior wall defect from base to apex with mild peri-infarct ischemia. LVEF=53%. Cardiac cath 01/27/13 with stable disease. Admitted to [facility] 02/03/13 with chest pain. Mild elevation troponin. Started on Imdur and Plavix. Carotid dopplers with mild bilateral disease. He has been diagnosed with non-Hodgkins lymphoma treated with chemotherapy. He had involvement of his adrenal gland and he had radiation therapy. He has had testicular surgery followed by radiation. Echo June 2019 with LVEF=55-60%. Mild aortic stenosis (mean gradient 8 mmHg). He has been in remission since February 2019.   He is here today for follow up. The patient denies any chest pain, dyspnea, palpitations, lower extremity edema, orthopnea, PND, dizziness, near syncope or syncope.   Primary Care Physician: [name], MD   Past Medical History:  Diagnosis Date  . Anemia in chronic renal disease 05/07/2017  . Anxiety   . Atrial fibrillation  ([place])   . COPD (chronic obstructive pulmonary disease) ([place])    pt. denies  . Coronary artery disease    a. h/o Overlapping stents RCA;  b. 06/2011 Cath: patent stents, nonobs dzs, NL EF.  . Diabetic peripheral neuropathy ([place])   . Diffuse non-Hodgkin's lymphoma of testis ([place]) 09/28/2015  . DM (diabetes mellitus) ([place])    Type 2, peripheral neuropathy.  . Dyspnea    with exertion  . Dysrhythmia   . GERD (gastroesophageal reflux disease)   . Headache   . History of bronchitis   . History of kidney stones   . History of radiation therapy 02/19/16 - 03/13/16   Testis/Scrotum: 32.4 Gy in 18 fractions  . History of radiation therapy 08/07/16-08/20/16   left adrenal gland mass treated to 30 Gy in 10 fractions  . Hyperlipidemia   . Hypertension   . Iron deficiency anemia due to chronic blood loss 08/08/2017  . Low testosterone   . Nephrolithiasis   . Osteoarthritis    shoulder  . Restless leg   . SVT (supraventricular tachycardia) ([place])   . Urinary frequency   . Wears partial dentures    upper and lower    Past Surgical History:  Procedure Laterality Date  . APPENDECTOMY    . [place]  . CARDIAC CATHETERIZATION  01/2013  . CATARACT EXTRACTION, BILATERAL    . CHOLECYSTECTOMY    . COLONOSCOPY    . CORONARY ANGIOPLASTY  2004  . CYSTOSCOPY N/A 08/18/2017   Procedure: CYSTOSCOPY WITH FULGURATION AND SUPRA PUBIC TUBE PLACEMENT;  Surgeon: Kathie Rhodes, MD;  Location: [facility];  Service: Urology;  Laterality: N/A;  . EYE SURGERY Bilateral    cataracts  . IR GENERIC HISTORICAL  10/05/2015   IR US GUIDE VASC ACCESS RIGHT 10/05/2015 Marybelle Killings, MD [facility]  . IR GENERIC HISTORICAL  10/05/2015   IR FLUORO GUIDE PORT INSERTION RIGHT 10/05/2015 Marybelle Killings, MD [facility]  . LEFT HEART CATHETERIZATION WITH CORONARY ANGIOGRAM N/A 06/18/2011   Procedure: LEFT HEART CATHETERIZATION WITH CORONARY ANGIOGRAM;  Surgeon: Peter M Martinique, MD;  Location: [facility] CATH LAB;  Service:  Cardiovascular;  Laterality: N/A;  . LEFT HEART CATHETERIZATION WITH CORONARY ANGIOGRAM N/A 01/27/2013   Procedure: LEFT HEART CATHETERIZATION WITH CORONARY ANGIOGRAM;  Surgeon: Burnell Blanks, MD;  Location: [facility] CATH LAB;  Service: Cardiovascular;  Laterality: N/A;  . LUMBAR LAMINECTOMY/DECOMPRESSION MICRODISCECTOMY N/A 02/07/2015   Procedure: Lumbar three-Sacral one Decompression;  Surgeon: Kevan Ny Ditty, MD;  Location: [place] NEURO ORS;  Service: Neurosurgery;  Laterality: N/A;  L3 to S1 Decompression  . MULTIPLE TOOTH EXTRACTIONS    . ORCHIECTOMY Right 09/01/2015   Procedure: RIGHT ORCHIECTOMY;  Surgeon: Kathie Rhodes, MD;  [facility];  Service: Urology;  Laterality: Right;  . port a cath in place     . ROTATOR CUFF REPAIR Left     Current Outpatient Medications  Medication Sig Dispense Refill  . ACCU-CHEK AVIVA PLUS test strip TEST 3 TIMES A DAY (Patient taking differently: TEST 2 TIMES A DAY) 300 each 5  . acetaminophen (TYLENOL) 500 MG tablet Take 1,000 mg by mouth every 6 (six) hours as needed (for pain/fever/headaches.).     Marland Kitchen atorvastatin (LIPITOR) 40 MG tablet TAKE 1 TABLET BY MOUTH EVERY DAY 90 tablet 0  . ciprofloxacin (CIPRO) 500 MG tablet Take 1 tablet (500 mg total) by mouth 2 (two) times daily. 14 tablet 0  . docusate sodium (COLACE) 50 MG capsule Take 1 capsule (50 mg total) by mouth 2 (two) times daily as needed (for constipation). 90 capsule 0  . enoxaparin (LOVENOX) 150 MG/ML injection Inject 1 mL (150 mg total) into the skin daily. 7 Syringe 0  . furosemide (LASIX) 40 MG tablet TAKE 1 TABLET BY MOUTH TWICE A DAY 60 tablet 0  . gabapentin (NEURONTIN) 300 MG capsule Take 300 mg by mouth 3 (three) times daily.    Marland Kitchen HYDROcodone-acetaminophen (NORCO) 10-325 MG tablet Take 1-2 tablets by mouth every 4 (four) hours as needed for moderate pain. Maximum dose per 24 hours - 8 pills 20 tablet 0  . KLOR-CON M20 20 MEQ tablet TAKE 1 TABLET BY MOUTH EVERY DAY 90 tablet 0    . magic mouthwash w/lidocaine SOLN Take 5 mLs by mouth 4 (four) times daily as needed for mouth pain. 1:1 ratio. DIPHENHYDRAMINE HCL (ANTIHISTAMINES - ETHANOLAMINES),ALUM & MAG HYDROXIDE-SIMETH,NYSTATIN (ANTI-INFECTIVES - THROAT),LIDOCAINE HCL (ANESTHETICS TOPICAL ORAL) 600 mL 3  . Melatonin 1 MG TABS Take 1 tablet by mouth at bedtime.     . metFORMIN (GLUCOPHAGE) 500 MG tablet TAKE 1 TABLET BY MOUTH TWICE A DAY WITH A MEAL 180 tablet 2  . Multiple Vitamins-Minerals (MULTIVITAMIN WITH MINERALS) tablet Take 1 tablet by mouth daily.    . nitroGLYCERIN (NITROSTAT) 0.4 MG SL tablet Place 1 tablet (0.4 mg total) under the tongue every 5 (five) minutes as needed. Chest pain 25 tablet 6  . pantoprazole (PROTONIX) 40 MG tablet Take 40 mg by mouth daily.    . phenazopyridine (PYRIDIUM) 95 MG tablet Take 95 mg by  mouth 3 (three) times daily as needed for pain. Reports he take 2 tablets TID    . polyethylene glycol (MIRALAX / GLYCOLAX) packet Take 17 g by mouth daily. 14 each 0  . pramipexole (MIRAPEX) 1.5 MG tablet Take 1.5 mg by mouth 2 (two) times daily.    . tamsulosin (FLOMAX) 0.4 MG CAPS capsule Take 0.4 mg by mouth daily.    . TOUJEO SOLOSTAR 300 UNIT/ML SOPN INJECT 20 UNITS INTO THE SKIN DAILY AFTER BREAKFAST. 15 mL 1  . vitamin B-12 (CYANOCOBALAMIN) 1000 MCG tablet Take 1 tablet (1,000 mcg total) by mouth daily. 30 tablet 0  . warfarin (COUMADIN) 5 MG tablet Take as directed by anticoagulation clinic (Patient taking differently: Take 5-7.5 mg by mouth as directed. 5 mg Monday, Wed, and Fri, and 7.5 mg  All other days) 45 tablet 3  . warfarin (COUMADIN) 5 MG tablet Take as directed by anticoagulation clinic - 30 day 45 tablet 3  . metoprolol succinate (TOPROL-XL) 50 MG 24 hr tablet Take 1 tablet (50 mg total) by mouth daily. Take with or immediately following a meal. 30 tablet 11   No current facility-administered medications for this visit.    Facility-Administered Medications Ordered in Other  Visits  Medication Dose Route Frequency Provider Last Rate Last Dose  . acetaminophen (TYLENOL) tablet 650 mg  650 mg Oral Once Raynham, Sarah M, NP      . pegfilgrastim (NEULASTA ONPRO KIT) injection 6 mg  6 mg Subcutaneous Once Volanda Napoleon, MD      . sodium chloride flush (NS) 0.9 % injection 10 mL  10 mL Intracatheter PRN Volanda Napoleon, MD   10 mL at 02/27/17 1503  . sodium chloride flush (NS) 0.9 % injection 10 mL  10 mL Intravenous PRN Volanda Napoleon, MD   10 mL at 05/07/17 8250    Allergies  Allergen Reactions  . Ace Inhibitors Other (See Comments)    cough  . Codeine Nausea Only and Rash       . Penicillins Rash    Childhood allergy Has patient had a PCN reaction causing immediate rash, facial/tongue/throat swelling, SOB or lightheadedness with hypotension: Yes Has patient had a PCN reaction causing severe rash involving mucus membranes or skin necrosis: Yes Has patient had a PCN reaction that required hospitalization No Has patient had a PCN reaction occurring within the last 10 years: No If all of the above answers are "NO", then may proceed with Cephalosporin use.     Social History   Socioeconomic History  . Marital status: Married    Spouse name: Not on file  . Number of children: 3  . Years of education: Not on file  . Highest education level: Not on file  Occupational History  . Occupation: Retired    Fish farm manager: RETIRED  Social Needs  . Financial resource strain: Not on file  . Food insecurity:    Worry: Not on file    Inability: Not on file  . Transportation needs:    Medical: Not on file    Non-medical: Not on file  Tobacco Use  . Smoking status: Former Smoker    Packs/day: 1.50    Years: 20.00    Pack years: 30.00    Types: Cigarettes    Last attempt to quit: 04/05/1978    Years since quitting: 39.4  . Smokeless tobacco: Never Used  Substance and Sexual Activity  . Alcohol use: No  . Drug use: No  .  Sexual activity: Not Currently    Lifestyle  . Physical activity:    Days per week: Not on file    Minutes per session: Not on file  . Stress: Not on file  Relationships  . Social connections:    Talks on phone: Not on file    Gets together: Not on file    Attends religious service: Not on file    Active member of club or organization: Not on file    Attends meetings of clubs or organizations: Not on file    Relationship status: Not on file  . Intimate partner violence:    Fear of current or ex partner: Not on file    Emotionally abused: Not on file    Physically abused: Not on file    Forced sexual activity: Not on file  Other Topics Concern  . Not on file  Social History Narrative  . Not on file    Family History  Problem Relation Age of Onset  . Alzheimer's disease Mother   . Heart disease Mother   . Heart disease Father   . Migraines Father   . Ulcers Father   . Prostate cancer Brother   . Coronary artery disease Unknown        Male 1st degree relative <50  . Coronary artery disease Unknown        male 1st degree relative <60  . Heart disease Sister   . Obesity Sister        Morbid  . Arthritis Sister   . Heart disease Brother   . Arthritis Brother   . Sleep apnea Son   . Obesity Son   . Migraines Daughter   . Thyroid disease Daughter     Review of Systems:  As stated in the HPI and otherwise negative.   BP (!) 104/58   Pulse 74   Ht 5' 10"  (1.778 m)   Wt 212 lb 12.8 oz (96.5 kg)   SpO2 95%   BMI 30.53 kg/m   Physical Examination: General: Well developed, well nourished, NAD  HEENT: OP clear, mucus membranes moist  SKIN: warm, dry. No rashes. Neuro: No focal deficits  Musculoskeletal: Muscle strength 5/5 all ext  Psychiatric: Mood and affect normal  Neck: No JVD, no carotid bruits, no thyromegaly, no lymphadenopathy.  Lungs:Clear bilaterally, no wheezes, rhonci, crackles Cardiovascular: Regular rate and rhythm. Soft systolic murmur noted.  Abdomen:Soft. Bowel sounds present.  Non-tender.  Extremities: 1+ bilateral lower extremity edema. Pulses are 2 + in the bilateral DP/PT.  Echo June 2019: Left ventricle: The cavity size was normal. Wall thickness was   normal. Systolic function was normal. The estimated ejection   fraction was in the range of 55% to 60%. There was an increased   relative contribution of atrial contraction to ventricular   filling. Doppler parameters are consistent with abnormal left   ventricular relaxation (grade 1 diastolic dysfunction). - Aortic valve: Moderately thickened, moderately calcified   leaflets. Valve area (VTI): 2.26 cm^2. Valve area (Vmax): 2.02   cm^2. Valve area (Vmean): 2.02 cm^2. - Mitral valve: Calcified annulus. - Atrial septum: No defect or patent foramen ovale was identified. - Tricuspid valve: There was mild regurgitation  Cardiac cath 01/27/13: Left main: No obstructive disease.  Left Anterior Descending Artery: Large caliber vessel in the proximal segment with trifurcation in the mid segment into a large septal perforating branch, moderate caliber bifurcating diagonal branch and small to moderate caliber continuation of the LAD. There appears  to be a 40-50% stenosis at the trifurcation in the mid LAD. The diagonal branch bifurcates. The superior branch of the diagonal after the bifurcation is small in caliber with 70% stenosis.  Circumflex Artery: Large caliber vessel with large caliber obtuse marginal branch. The proximal and mid vessel has diffuse 30% stenosis, unchanged. The obtuse marginal branch has diffuse 30% stenosis in the proximal segment of the vessel.  Right Coronary Artery: Large caliber dominant vessel with patent proximal and mid stented segment with 20% stent restenosis. Mild plaque in the large caliber PDA.   EKG:  EKG is not ordered today. The ekg ordered today demonstrates  Recent Labs: 06/20/2017: Magnesium 1.6 08/08/2017: ALT 15 08/23/2017: BUN 15; Creatinine, Ser 1.00; Hemoglobin 9.0; Platelets  102; Potassium 4.1; Sodium 139   Lipid Panel    Component Value Date/Time   CHOL 139 05/10/2015 1212   TRIG 151.0 (H) 05/10/2015 1212   HDL 32.40 (L) 05/10/2015 1212   CHOLHDL 4 05/10/2015 1212   VLDL 30.2 05/10/2015 1212   LDLCALC 76 05/10/2015 1212   LDLDIRECT 85.0 12/05/2014 1126     Wt Readings from Last 3 Encounters:  09/11/17 212 lb 12.8 oz (96.5 kg)  08/22/17 214 lb (97.1 kg)  08/18/17 214 lb (97.1 kg)     Other studies Reviewed: Additional studies/ records that were reviewed today include: . Review of the above records demonstrates:    Assessment and Plan:   1. CAD without angina: No chest pain. He is known to have moderate CAD wth last cardiac cath in 2015 with stable disease in the LAD and patent RCA stents. Echo September 2017 with normal LV function. Continue beta blocker and a statin. He is not on ASA or Plavix since he is on coumadin. Will lower Toprol to 50 mg per day due to fatigue and soft BP.   2. Paroxysmal atrial fibrillation: He is in atrial fib today. He remains on coumadin. He did not want to use Xarelto or Eliquis due to cost. Continue Toprol.    3. Sleep apnea: He has chosen not to keep his referral for a sleep study.     4. Hyperlipidemia: Will continue statin. Check lipids now.   5. Chronic diastolic CHF: His weight is down. He has some LE edema. Still taking Lasix daily. He will use twice daily as needed.   Current medicines are reviewed at length with the patient today.  The patient does not have concerns regarding medicines.  The following changes have been made:    Labs/ tests ordered today include:   Orders Placed This Encounter  Procedures  . Lipid Profile    Disposition:   FU with me in 12 months  Signed, Lauree Chandler, MD 09/11/2017 11:35 AM    [facility] [address], [place], [place]  [address] Phone: [phone]; Fax: [phone]

## 2017-09-11 ENCOUNTER — Ambulatory Visit: Payer: Medicare Other | Admitting: Cardiovascular Disease

## 2017-09-11 ENCOUNTER — Encounter: Payer: Self-pay | Admitting: Cardiovascular Disease

## 2017-09-11 VITALS — BP 104/58 | HR 74 | Ht 70.0 in | Wt 212.8 lb

## 2017-09-11 DIAGNOSIS — I480 Paroxysmal atrial fibrillation: Secondary | ICD-10-CM

## 2017-09-11 DIAGNOSIS — I5032 Chronic diastolic (congestive) heart failure: Secondary | ICD-10-CM | POA: Diagnosis not present

## 2017-09-11 DIAGNOSIS — E7800 Pure hypercholesterolemia, unspecified: Secondary | ICD-10-CM

## 2017-09-11 DIAGNOSIS — I2510 Atherosclerotic heart disease of native coronary artery without angina pectoris: Secondary | ICD-10-CM | POA: Diagnosis not present

## 2017-09-11 MED ORDER — METOPROLOL SUCCINATE ER 50 MG PO TB24: 50.0000 mg | ORAL_TABLET | Freq: Every day | ORAL | 11 refills | Status: DC

## 2017-09-11 NOTE — Patient Instructions (Signed)
Medication Instructions:  Your physician has recommended you make the following change in your medication: Decrease Toprol to 50 mg by mouth daily   Labwork: Your physician recommends that you return for lab work on September 9,2019.  This will be fasting (lipid profile).  The lab opens at 7:30 AM   Testing/Procedures: none  Follow-Up: Your physician recommends that you schedule a follow-up appointment in: 12 months. Please call our office in about 8 months to schedule this appointment   Any Other Special Instructions Will Be Listed Below (If Applicable).     If you need a refill on your cardiac medications before your next appointment, please call your pharmacy.

## 2017-09-12 ENCOUNTER — Ambulatory Visit (INDEPENDENT_AMBULATORY_CARE_PROVIDER_SITE_OTHER): Payer: Medicare Other | Admitting: General Practice

## 2017-09-12 DIAGNOSIS — Z7901 Long term (current) use of anticoagulants: Secondary | ICD-10-CM

## 2017-09-12 DIAGNOSIS — I480 Paroxysmal atrial fibrillation: Secondary | ICD-10-CM

## 2017-09-12 LAB — POCT INR
INR: 2.1 (ref 2.0–3.0)
INR: 2.6 (ref 2.0–3.0)

## 2017-09-12 NOTE — Patient Instructions (Signed)
Pre visit review using our clinic review tool, if applicable. No additional management support is needed unless otherwise documented below in the visit note.  INR is 2.1 today.  Continue to take 7.5 daily except 5 mg on Monday/Wed/Fridays.  Re-check in 1 week. Dosing instructions given to Olivia Mackie, RN @ [facility] while in patient's home.

## 2017-09-15 ENCOUNTER — Other Ambulatory Visit: Payer: Medicare Other | Admitting: *Deleted

## 2017-09-15 DIAGNOSIS — I2510 Atherosclerotic heart disease of native coronary artery without angina pectoris: Secondary | ICD-10-CM

## 2017-09-15 DIAGNOSIS — E7800 Pure hypercholesterolemia, unspecified: Secondary | ICD-10-CM

## 2017-09-15 LAB — LIPID PANEL
CHOLESTEROL TOTAL: 135 mg/dL (ref 100–199)
Chol/HDL Ratio: 3.6 ratio (ref 0.0–5.0)
HDL: 37 mg/dL — ABNORMAL LOW (ref >39)
LDL Calculated: 53 mg/dL (ref 0–99)
Triglycerides: 224 mg/dL — ABNORMAL HIGH (ref 0–149)
VLDL CHOLESTEROL CAL: 45 mg/dL — AB (ref 5–40)

## 2017-09-17 ENCOUNTER — Ambulatory Visit: Payer: Medicare Other | Admitting: Family Medicine

## 2017-09-17 ENCOUNTER — Other Ambulatory Visit: Payer: Self-pay | Admitting: Family Medicine

## 2017-09-17 ENCOUNTER — Encounter: Payer: Self-pay | Admitting: *Deleted

## 2017-09-17 ENCOUNTER — Encounter: Payer: Self-pay | Admitting: Family Medicine

## 2017-09-17 VITALS — BP 94/42 | HR 84 | Temp 98.3°F | Wt 215.7 lb

## 2017-09-17 DIAGNOSIS — E1142 Type 2 diabetes mellitus with diabetic polyneuropathy: Secondary | ICD-10-CM

## 2017-09-17 DIAGNOSIS — N184 Chronic kidney disease, stage 4 (severe): Secondary | ICD-10-CM | POA: Diagnosis not present

## 2017-09-17 DIAGNOSIS — D631 Anemia in chronic kidney disease: Secondary | ICD-10-CM | POA: Diagnosis not present

## 2017-09-17 DIAGNOSIS — R42 Dizziness and giddiness: Secondary | ICD-10-CM | POA: Diagnosis not present

## 2017-09-17 NOTE — Progress Notes (Signed)
Subjective:     Patient ID: Gary Radloff., male   DOB: [birth year], 84 y.o.   MRN: [id number]  HPI Patient seen with chief complaint of some lightheadedness and dizziness. He has multiple chronic problems including history of atrial fibrillation, hypertension, CAD, history of ventricular tachycardia, COPD, type 2 diabetes, non-Hodgkin's lymphoma, chronic kidney disease, chronic anemia, dyslipidemia, restless leg syndrome, hyperlipidemia. No recent hypoglycemic symptoms. No syncope. He has lost 75 pounds over the past couple of years with concomitant reduction in BP and glucose.  His blood pressures been on the low side recently with frequent readings 90s to low 517G systolic. Cardiologist recently reduced his metoprolol from 100 mg to 50 mg but has not seen any improvement. His lightheadedness has been related to standing or ambulating but not sitting. Symptoms have been progressive over several weeks. No nausea, vomiting, or diarrhea.  Echocardiogram 06/10/17 ejection fraction 55-60%. Recent A1c 5.6%. He is supposed to be on Lasix 40 mg twice a day but only taking once daily.  Recent labs reviewed. Recent hemoglobin 9.0. Chemistry stable.  Past Medical History:  Diagnosis Date  . Anemia in chronic renal disease 05/07/2017  . Anxiety   . Atrial fibrillation ([place])   . COPD (chronic obstructive pulmonary disease) ([place])    pt. denies  . Coronary artery disease    a. h/o Overlapping stents RCA;  b. 06/2011 Cath: patent stents, nonobs dzs, NL EF.  . Diabetic peripheral neuropathy ([place])   . Diffuse non-Hodgkin's lymphoma of testis ([place]) 09/28/2015  . DM (diabetes mellitus) ([place])    Type 2, peripheral neuropathy.  . Dyspnea    with exertion  . Dysrhythmia   . GERD (gastroesophageal reflux disease)   . Headache   . History of bronchitis   . History of kidney stones   . History of radiation therapy 02/19/16 - 03/13/16   Testis/Scrotum: 32.4 Gy in 18 fractions  . History of radiation therapy  08/07/16-08/20/16   left adrenal gland mass treated to 30 Gy in 10 fractions  . Hyperlipidemia   . Hypertension   . Iron deficiency anemia due to chronic blood loss 08/08/2017  . Low testosterone   . Nephrolithiasis   . Osteoarthritis    shoulder  . Restless leg   . SVT (supraventricular tachycardia) ([place])   . Urinary frequency   . Wears partial dentures    upper and lower   Past Surgical History:  Procedure Laterality Date  . APPENDECTOMY    . [place]  . CARDIAC CATHETERIZATION  01/2013  . CATARACT EXTRACTION, BILATERAL    . CHOLECYSTECTOMY    . COLONOSCOPY    . CORONARY ANGIOPLASTY  2004  . CYSTOSCOPY N/A 08/18/2017   Procedure: CYSTOSCOPY WITH FULGURATION AND SUPRA PUBIC TUBE PLACEMENT;  Surgeon: Kathie Rhodes, MD;  [facility];  Service: Urology;  Laterality: N/A;  . EYE SURGERY Bilateral    cataracts  . IR GENERIC HISTORICAL  10/05/2015   IR US GUIDE VASC ACCESS RIGHT 10/05/2015 Marybelle Killings, MD [facility]  . IR GENERIC HISTORICAL  10/05/2015   IR FLUORO GUIDE PORT INSERTION RIGHT 10/05/2015 Marybelle Killings, MD [facility]  . LEFT HEART CATHETERIZATION WITH CORONARY ANGIOGRAM N/A 06/18/2011   Procedure: LEFT HEART CATHETERIZATION WITH CORONARY ANGIOGRAM;  Surgeon: Peter M Martinique, MD;  Location: [facility] CATH LAB;  Service: Cardiovascular;  Laterality: N/A;  . LEFT HEART CATHETERIZATION WITH CORONARY ANGIOGRAM N/A 01/27/2013   Procedure: LEFT HEART CATHETERIZATION WITH CORONARY ANGIOGRAM;  Surgeon: Burnell Blanks, MD;  Location: [facility] CATH LAB;  Service: Cardiovascular;  Laterality: N/A;  . LUMBAR LAMINECTOMY/DECOMPRESSION MICRODISCECTOMY N/A 02/07/2015   Procedure: Lumbar three-Sacral one Decompression;  Surgeon: Kevan Ny Ditty, MD;  Location: [place] NEURO ORS;  Service: Neurosurgery;  Laterality: N/A;  L3 to S1 Decompression  . MULTIPLE TOOTH EXTRACTIONS    . ORCHIECTOMY Right 09/01/2015   Procedure: RIGHT ORCHIECTOMY;  Surgeon: Kathie Rhodes, MD;  [facility];  Service: Urology;  Laterality: Right;  . port a cath in place     . ROTATOR CUFF REPAIR Left     reports that he quit smoking about 39 years ago. His smoking use included cigarettes. He has a 30.00 pack-year smoking history. He has never used smokeless tobacco. He reports that he does not drink alcohol or use drugs. family history includes Alzheimer's disease in his mother; Arthritis in his brother and sister; Coronary artery disease in his unknown relative and unknown relative; Heart disease in his brother, father, mother, and sister; Migraines in his daughter and father; Obesity in his sister and son; Prostate cancer in his brother; Sleep apnea in his son; Thyroid disease in his daughter; Ulcers in his father. Allergies  Allergen Reactions  . Ace Inhibitors Other (See Comments)    cough  . Codeine Nausea Only and Rash       . Penicillins Rash    Childhood allergy Has patient had a PCN reaction causing immediate rash, facial/tongue/throat swelling, SOB or lightheadedness with hypotension: Yes Has patient had a PCN reaction causing severe rash involving mucus membranes or skin necrosis: Yes Has patient had a PCN reaction that required hospitalization No Has patient had a PCN reaction occurring within the last 10 years: No If all of the above answers are "NO", then may proceed with Cephalosporin use.      Review of Systems  Constitutional: Positive for fatigue. Negative for chills and fever.  Respiratory: Negative for cough and shortness of breath.   Cardiovascular: Negative for chest pain.  Gastrointestinal: Negative for abdominal pain, diarrhea, nausea and vomiting.  Genitourinary: Negative for dysuria.  Neurological: Positive for dizziness and weakness. Negative for seizures, syncope and speech difficulty.       Objective:   Physical Exam  Constitutional: He is oriented to person, place, and time. He appears well-developed and well-nourished.  Neck: Neck supple.   Cardiovascular: Normal rate.  Pulmonary/Chest: Effort normal and breath sounds normal. He has no rales.  Abdominal: Soft. There is no tenderness.  Musculoskeletal:  Has compression stockings on bilaterally. Trace edema bilaterally  Lymphadenopathy:    He has no cervical adenopathy.  Neurological: He is alert and oriented to person, place, and time. No cranial nerve deficit.       Assessment:     Patient has multiple chronic problems as above who presents with some positional related dizziness and lightheadedness. No syncope. Standing blood pressure today 105/60 and no orthostatic drop. Suspect combination of factors including venous stasis, low blood pressure, chronic normocytic anemia. He is not describing any hypoglycemic type symptoms.  Suspect he has some autonomic dysfunction.      Plan:     -recommend change positions very slowly -Walker use at all times -Stay adequately hydrated -?benefit from abdominal binder.  [name] MD [facility]

## 2017-09-17 NOTE — Patient Instructions (Addendum)
Reduce the Toujeo to 15 units once daily.  Try to change positions slowly.

## 2017-09-18 ENCOUNTER — Ambulatory Visit (INDEPENDENT_AMBULATORY_CARE_PROVIDER_SITE_OTHER): Payer: Medicare Other | Admitting: General Practice

## 2017-09-18 DIAGNOSIS — Z7901 Long term (current) use of anticoagulants: Secondary | ICD-10-CM

## 2017-09-18 DIAGNOSIS — I480 Paroxysmal atrial fibrillation: Secondary | ICD-10-CM

## 2017-09-18 LAB — POCT INR: INR: 2.4 (ref 2.0–3.0)

## 2017-09-18 NOTE — Patient Instructions (Signed)
Pre visit review using our clinic review tool, if applicable. No additional management support is needed unless otherwise documented below in the visit note.  Continue to take 7.5 daily except 5 mg on Monday/Wed/Fridays.  Re-check in 1 week. Dosing instructions given to Estill Bamberg, RN @ [facility] while in patient's home.

## 2017-09-19 ENCOUNTER — Inpatient Hospital Stay: Payer: Medicare Other | Attending: Hematology & Oncology

## 2017-09-19 ENCOUNTER — Inpatient Hospital Stay: Payer: Medicare Other

## 2017-09-19 ENCOUNTER — Encounter: Payer: Self-pay | Admitting: Hematology & Oncology

## 2017-09-19 ENCOUNTER — Inpatient Hospital Stay (HOSPITAL_BASED_OUTPATIENT_CLINIC_OR_DEPARTMENT_OTHER): Payer: Medicare Other | Admitting: Hematology & Oncology

## 2017-09-19 ENCOUNTER — Other Ambulatory Visit: Payer: Self-pay

## 2017-09-19 VITALS — BP 110/59 | HR 88 | Temp 98.2°F | Resp 18 | Wt 212.0 lb

## 2017-09-19 DIAGNOSIS — I4891 Unspecified atrial fibrillation: Secondary | ICD-10-CM | POA: Insufficient documentation

## 2017-09-19 DIAGNOSIS — R062 Wheezing: Secondary | ICD-10-CM | POA: Diagnosis not present

## 2017-09-19 DIAGNOSIS — M549 Dorsalgia, unspecified: Secondary | ICD-10-CM | POA: Insufficient documentation

## 2017-09-19 DIAGNOSIS — Z923 Personal history of irradiation: Secondary | ICD-10-CM | POA: Insufficient documentation

## 2017-09-19 DIAGNOSIS — D631 Anemia in chronic kidney disease: Secondary | ICD-10-CM

## 2017-09-19 DIAGNOSIS — C8589 Other specified types of non-Hodgkin lymphoma, extranodal and solid organ sites: Secondary | ICD-10-CM

## 2017-09-19 DIAGNOSIS — Z7901 Long term (current) use of anticoagulants: Secondary | ICD-10-CM | POA: Diagnosis not present

## 2017-09-19 DIAGNOSIS — N189 Chronic kidney disease, unspecified: Secondary | ICD-10-CM

## 2017-09-19 DIAGNOSIS — Z9221 Personal history of antineoplastic chemotherapy: Secondary | ICD-10-CM

## 2017-09-19 DIAGNOSIS — N184 Chronic kidney disease, stage 4 (severe): Secondary | ICD-10-CM

## 2017-09-19 DIAGNOSIS — Z9225 Personal history of immunosupression therapy: Secondary | ICD-10-CM | POA: Diagnosis not present

## 2017-09-19 DIAGNOSIS — Z79899 Other long term (current) drug therapy: Secondary | ICD-10-CM

## 2017-09-19 DIAGNOSIS — Z7984 Long term (current) use of oral hypoglycemic drugs: Secondary | ICD-10-CM | POA: Insufficient documentation

## 2017-09-19 DIAGNOSIS — D500 Iron deficiency anemia secondary to blood loss (chronic): Secondary | ICD-10-CM

## 2017-09-19 DIAGNOSIS — H538 Other visual disturbances: Secondary | ICD-10-CM | POA: Diagnosis not present

## 2017-09-19 DIAGNOSIS — N289 Disorder of kidney and ureter, unspecified: Secondary | ICD-10-CM | POA: Diagnosis not present

## 2017-09-19 DIAGNOSIS — D508 Other iron deficiency anemias: Secondary | ICD-10-CM | POA: Diagnosis not present

## 2017-09-19 DIAGNOSIS — R600 Localized edema: Secondary | ICD-10-CM | POA: Diagnosis not present

## 2017-09-19 DIAGNOSIS — R531 Weakness: Secondary | ICD-10-CM | POA: Insufficient documentation

## 2017-09-19 LAB — CBC WITH DIFFERENTIAL (CANCER CENTER ONLY)
BASOS ABS: 0.2 10*3/uL — AB (ref 0.0–0.1)
BLASTS: 0 %
Band Neutrophils: 0 %
Basophils Relative: 3 %
EOS ABS: 0.1 10*3/uL (ref 0.0–0.5)
Eosinophils Relative: 2 %
HCT: 29.5 % — ABNORMAL LOW (ref 38.7–49.9)
HEMOGLOBIN: 9.7 g/dL — AB (ref 13.0–17.1)
Lymphocytes Relative: 20 %
Lymphs Abs: 1.2 10*3/uL (ref 0.9–3.3)
MCH: 33 pg (ref 28.0–33.4)
MCHC: 32.9 g/dL (ref 32.0–35.9)
MCV: 100.3 fL — AB (ref 82.0–98.0)
METAMYELOCYTES PCT: 0 %
MONOS PCT: 7 %
MYELOCYTES: 0 %
Monocytes Absolute: 0.4 10*3/uL (ref 0.1–0.9)
Neutro Abs: 4 10*3/uL (ref 1.5–6.5)
Neutrophils Relative %: 68 %
Other: 0 %
Platelet Count: 124 10*3/uL — ABNORMAL LOW (ref 145–400)
Promyelocytes Relative: 0 %
RBC: 2.94 MIL/uL — AB (ref 4.20–5.70)
RDW: 14.2 % (ref 11.1–15.7)
WBC: 5.9 10*3/uL (ref 4.0–10.0)
nRBC: 0 /100 WBC

## 2017-09-19 LAB — CMP (CANCER CENTER ONLY)
ALT: 11 U/L (ref 10–47)
ANION GAP: 0 — AB (ref 5–15)
AST: 22 U/L (ref 11–38)
Albumin: 3.3 g/dL — ABNORMAL LOW (ref 3.5–5.0)
Alkaline Phosphatase: 67 U/L (ref 26–84)
BUN: 12 mg/dL (ref 7–22)
CALCIUM: 8.8 mg/dL (ref 8.0–10.3)
CO2: 28 mmol/L (ref 18–33)
Chloride: 108 mmol/L (ref 98–108)
Creatinine: 1.1 mg/dL (ref 0.60–1.20)
Glucose, Bld: 141 mg/dL — ABNORMAL HIGH (ref 73–118)
POTASSIUM: 3.9 mmol/L (ref 3.3–4.7)
Sodium: 135 mmol/L (ref 128–145)
TOTAL PROTEIN: 6.5 g/dL (ref 6.4–8.1)
Total Bilirubin: 0.7 mg/dL (ref 0.2–1.6)

## 2017-09-19 LAB — IRON AND TIBC
Iron: 22 ug/dL — ABNORMAL LOW (ref 42–163)
SATURATION RATIOS: 11 % — AB (ref 42–163)
TIBC: 199 ug/dL — AB (ref 202–409)
UIBC: 177 ug/dL

## 2017-09-19 LAB — FERRITIN: FERRITIN: 1098 ng/mL — AB (ref 24–336)

## 2017-09-19 MED ORDER — DARBEPOETIN ALFA 300 MCG/0.6ML IJ SOSY
PREFILLED_SYRINGE | INTRAMUSCULAR | Status: AC
  Filled 2017-09-19: qty 0.6

## 2017-09-19 MED ORDER — DARBEPOETIN ALFA 300 MCG/0.6ML IJ SOSY
300.0000 ug | PREFILLED_SYRINGE | Freq: Once | INTRAMUSCULAR | Status: AC
  Administered 2017-09-19: 300 ug via SUBCUTANEOUS

## 2017-09-19 MED ORDER — SODIUM CHLORIDE 0.9% FLUSH
10.0000 mL | Freq: Once | INTRAVENOUS | Status: AC | PRN
  Administered 2017-09-19: 10 mL
  Filled 2017-09-19: qty 10

## 2017-09-19 MED ORDER — HEPARIN SOD (PORK) LOCK FLUSH 100 UNIT/ML IV SOLN
500.0000 [IU] | Freq: Once | INTRAVENOUS | Status: AC | PRN
  Administered 2017-09-19: 500 [IU]
  Filled 2017-09-19: qty 5

## 2017-09-19 NOTE — Patient Instructions (Signed)

## 2017-09-19 NOTE — Patient Instructions (Signed)
Implanted Port Home Guide An implanted port is a type of central line that is placed under the skin. Central lines are used to provide IV access when treatment or nutrition needs to be given through a person's veins. Implanted ports are used for long-term IV access. An implanted port may be placed because:  You need IV medicine that would be irritating to the small veins in your hands or arms.  You need long-term IV medicines, such as antibiotics.  You need IV nutrition for a long period.  You need frequent blood draws for lab tests.  You need dialysis.  Implanted ports are usually placed in the chest area, but they can also be placed in the upper arm, the abdomen, or the leg. An implanted port has two main parts:  Reservoir. The reservoir is round and will appear as a small, raised area under your skin. The reservoir is the part where a needle is inserted to give medicines or draw blood.  Catheter. The catheter is a thin, flexible tube that extends from the reservoir. The catheter is placed into a large vein. Medicine that is inserted into the reservoir goes into the catheter and then into the vein.  How will I care for my incision site? Do not get the incision site wet. Bathe or shower as directed by your health care provider. How is my port accessed? Special steps must be taken to access the port:  Before the port is accessed, a numbing cream can be placed on the skin. This helps numb the skin over the port site.  Your health care provider uses a sterile technique to access the port. ? Your health care provider must put on a mask and sterile gloves. ? The skin over your port is cleaned carefully with an antiseptic and allowed to dry. ? The port is gently pinched between sterile gloves, and a needle is inserted into the port.  Only "non-coring" port needles should be used to access the port. Once the port is accessed, a blood return should be checked. This helps ensure that the port  is in the vein and is not clogged.  If your port needs to remain accessed for a constant infusion, a clear (transparent) bandage will be placed over the needle site. The bandage and needle will need to be changed every week, or as directed by your health care provider.  Keep the bandage covering the needle clean and dry. Do not get it wet. Follow your health care provider's instructions on how to take a shower or bath while the port is accessed.  If your port does not need to stay accessed, no bandage is needed over the port.  What is flushing? Flushing helps keep the port from getting clogged. Follow your health care provider's instructions on how and when to flush the port. Ports are usually flushed with saline solution or a medicine called heparin. The need for flushing will depend on how the port is used.  If the port is used for intermittent medicines or blood draws, the port will need to be flushed: ? After medicines have been given. ? After blood has been drawn. ? As part of routine maintenance.  If a constant infusion is running, the port may not need to be flushed.  How long will my port stay implanted? The port can stay in for as long as your health care provider thinks it is needed. When it is time for the port to come out, surgery will be   done to remove it. The procedure is similar to the one performed when the port was put in. When should I seek immediate medical care? When you have an implanted port, you should seek immediate medical care if:  You notice a bad smell coming from the incision site.  You have swelling, redness, or drainage at the incision site.  You have more swelling or pain at the port site or the surrounding area.  You have a fever that is not controlled with medicine.  This information is not intended to replace advice given to you by your health care provider. Make sure you discuss any questions you have with your health care provider. Document  Released: 12/24/2004 Document Revised: 06/01/2015 Document Reviewed: 08/31/2012 [employer] Interactive Patient Education  2017 [employer]  

## 2017-09-19 NOTE — Progress Notes (Signed)
Hematology and Oncology Follow Up Visit  Lenix Kidd [id number] 07/08/1933 84 y.o. 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 Texidor is back for follow-up.  He seems to be doing a little bit better.  The indwelling Foley catheter is now a suprapubic catheter.  It sounds like this is going to be further adjusted.  This is making him happier.  He is doing okay.  He has had no problems with cough or increased shortness of breath.  He is on Coumadin.  He has atrial fibrillation.  He has had no obvious bleeding.  His appetite is marginal.  His weight is holding steady.  There is been no fever.  He has had no swollen lymph nodes.  His chronic leg edema.  Overall, his performance status is ECOG 2.   Medications:  Current Outpatient Medications:  .  ACCU-CHEK AVIVA PLUS test strip, TEST 3 TIMES A DAY (Patient taking differently: TEST 2 TIMES A DAY), Disp: 300 each, Rfl: 5 .  acetaminophen (TYLENOL) 500 MG tablet, Take 1,000 mg by mouth every 6 (six) hours as needed (for pain/fever/headaches.). , Disp: , Rfl:  .  atorvastatin (LIPITOR) 40 MG tablet, TAKE 1 TABLET BY MOUTH EVERY DAY, Disp: 90 tablet, Rfl: 0 .  docusate sodium (COLACE) 50 MG capsule, Take 1 capsule (50 mg total) by mouth 2 (two) times daily as needed (for constipation)., Disp: 90 capsule, Rfl: 0 .  furosemide (LASIX) 40 MG tablet, TAKE 1 TABLET BY MOUTH TWICE A DAY, Disp: 60 tablet, Rfl: 0 .  gabapentin (NEURONTIN) 300 MG capsule, Take 300 mg by  mouth 3 (three) times daily., Disp: , Rfl:  .  HYDROcodone-acetaminophen (NORCO) 10-325 MG tablet, Take 1-2 tablets by mouth every 4 (four) hours as needed for moderate pain. Maximum dose per 24 hours - 8 pills, Disp: 20 tablet, Rfl: 0 .  KLOR-CON M20 20 MEQ tablet, TAKE 1 TABLET BY MOUTH EVERY DAY, Disp: 90 tablet, Rfl: 0 .  magic mouthwash w/lidocaine SOLN, Take 5 mLs by mouth 4 (four) times daily as needed for mouth pain. 1:1 ratio. DIPHENHYDRAMINE HCL (ANTIHISTAMINES - ETHANOLAMINES),ALUM & MAG HYDROXIDE-SIMETH,NYSTATIN (ANTI-INFECTIVES - THROAT),LIDOCAINE HCL (ANESTHETICS TOPICAL ORAL), Disp: 600 mL, Rfl: 3 .  Melatonin 1 MG TABS, Take 1 tablet by mouth at bedtime. , Disp: , Rfl:  .  metFORMIN (GLUCOPHAGE) 500 MG tablet, TAKE 1 TABLET BY MOUTH TWICE A DAY WITH A MEAL, Disp: 180 tablet, Rfl: 2 .  metoprolol succinate (TOPROL-XL) 50 MG 24 hr tablet, Take 1 tablet (50 mg total) by mouth daily. Take with or immediately following a meal. (Patient taking differently: Take 50 mg by mouth daily. Take half tab daily), Disp: 30 tablet, Rfl: 11 .  Multiple Vitamins-Minerals (MULTIVITAMIN WITH MINERALS) tablet, Take 1 tablet by mouth daily., Disp: , Rfl:  .  nitroGLYCERIN (NITROSTAT) 0.4 MG SL tablet, Place 1 tablet (0.4 mg total) under the  tongue every 5 (five) minutes as needed. Chest pain, Disp: 25 tablet, Rfl: 6 .  pantoprazole (PROTONIX) 40 MG tablet, Take 40 mg by mouth daily., Disp: , Rfl:  .  polyethylene glycol (MIRALAX / GLYCOLAX) packet, Take 17 g by mouth daily., Disp: 14 each, Rfl: 0 .  pramipexole (MIRAPEX) 1.5 MG tablet, Take 1.5 mg by mouth 2 (two) times daily., Disp: , Rfl:  .  TOUJEO SOLOSTAR 300 UNIT/ML SOPN, INJECT 20 UNITS INTO THE SKIN DAILY AFTER BREAKFAST. (Patient taking differently: Inject 15 Units into the skin daily after breakfast. ), Disp: 15 mL, Rfl: 1 .  vitamin B-12 (CYANOCOBALAMIN) 1000 MCG tablet, Take 1 tablet (1,000 mcg total) by mouth daily., Disp: 30 tablet, Rfl: 0 .   warfarin (COUMADIN) 5 MG tablet, Take as directed by anticoagulation clinic (Patient taking differently: Take 5-7.5 mg by mouth as directed. 5 mg Monday, Wed, and Fri, and 7.5 mg  All other days), Disp: 45 tablet, Rfl: 3 No current facility-administered medications for this visit.   Facility-Administered Medications Ordered in Other Visits:  .  acetaminophen (TYLENOL) tablet 650 mg, 650 mg, Oral, Once, [place], Sarah M, NP .  pegfilgrastim (NEULASTA ONPRO KIT) injection 6 mg, 6 mg, Subcutaneous, Once, Ennever, Peter R, MD .  sodium chloride flush (NS) 0.9 % injection 10 mL, 10 mL, Intracatheter, PRN, Volanda Napoleon, MD, 10 mL at 02/27/17 1503 .  sodium chloride flush (NS) 0.9 % injection 10 mL, 10 mL, Intravenous, PRN, Volanda Napoleon, MD, 10 mL at 05/07/17 5625  Allergies:  Allergies  Allergen Reactions  . Ace Inhibitors Other (See Comments)    cough  . Codeine Nausea Only and Rash       . Penicillins Rash    Childhood allergy Has patient had a PCN reaction causing immediate rash, facial/tongue/throat swelling, SOB or lightheadedness with hypotension: Yes Has patient had a PCN reaction causing severe rash involving mucus membranes or skin necrosis: Yes Has patient had a PCN reaction that required hospitalization No Has patient had a PCN reaction occurring within the last 10 years: No If all of the above answers are "NO", then may proceed with Cephalosporin use.     Past Medical History, Surgical history, Social history, and Family History were reviewed and updated.  Review of Systems: Review of Systems  HENT: Positive for sinus pain and sore throat.   Eyes: Positive for blurred vision.  Respiratory: Positive for wheezing.   Cardiovascular: Positive for leg swelling.  Gastrointestinal: Negative.   Genitourinary: Negative.   Musculoskeletal: Positive for back pain.  Skin: Negative.   Neurological: Positive for weakness.  Endo/Heme/Allergies: Negative.     Psychiatric/Behavioral: Negative.     Physical Exam:  weight is 212 lb (96.2 kg). His oral temperature is 98.2 F (36.8 C). His blood pressure is 110/59 (abnormal) and his pulse is 88. His respiration is 18 and oxygen saturation is 100%.   Wt Readings from Last 3 Encounters:  09/19/17 212 lb (96.2 kg)  09/17/17 215 lb 11.2 oz (97.8 kg)  09/11/17 212 lb 12.8 oz (96.5 kg)      Physical Exam  Constitutional: He is oriented to person, place, and time.  HENT:  Head: Normocephalic and atraumatic.  Mouth/Throat: Oropharynx is clear and moist.  Eyes: Pupils are equal, round, and reactive to light. EOM are normal.  Neck: Normal range of motion.  Cardiovascular: Normal rate, regular rhythm and normal heart sounds.  Pulmonary/Chest: Effort normal and breath sounds normal.  Abdominal: Soft.  Bowel sounds are normal.  Musculoskeletal: Normal range of motion. He exhibits no edema, tenderness or deformity.  Lymphadenopathy:    He has no cervical adenopathy.  Neurological: He is alert and oriented to person, place, and time.  Skin: Skin is warm and dry. No rash noted. No erythema.  Psychiatric: He has a normal mood and affect. His behavior is normal. Judgment and thought content normal.  Vitals reviewed.    Lab Results  Component Value Date   WBC 5.9 09/19/2017   HGB 9.7 (L) 09/19/2017   HCT 29.5 (L) 09/19/2017   MCV 100.3 (H) 09/19/2017   PLT 124 (L) 09/19/2017     Chemistry      Component Value Date/Time   NA 135 09/19/2017 1130   NA 141 01/10/2017 1136   NA 137 12/14/2015 1100   K 3.9 09/19/2017 1130   K 4.4 01/10/2017 1136   K 3.7 12/14/2015 1100   CL 108 09/19/2017 1130   CL 104 01/10/2017 1136   CO2 28 09/19/2017 1130   CO2 27 01/10/2017 1136   CO2 24 12/14/2015 1100   BUN 12 09/19/2017 1130   BUN 19 01/10/2017 1136   BUN 18.4 12/14/2015 1100   CREATININE 1.10 09/19/2017 1130   CREATININE 1.1 01/10/2017 1136   CREATININE 0.9 12/14/2015 1100      Component Value  Date/Time   CALCIUM 8.8 09/19/2017 1130   CALCIUM 9.4 01/10/2017 1136   CALCIUM 9.5 12/14/2015 1100   ALKPHOS 67 09/19/2017 1130   ALKPHOS 112 (H) 01/10/2017 1136   ALKPHOS 102 12/14/2015 1100   AST 22 09/19/2017 1130   AST 22 12/14/2015 1100   ALT 11 09/19/2017 1130   ALT 18 01/10/2017 1136   ALT 10 12/14/2015 1100   BILITOT 0.7 09/19/2017 1130   BILITOT 0.58 12/14/2015 1100      Impression and Plan: Mr. Cheramie is an 84 year old white male. He has relapsed large cell non-Hodgkin lymphoma of the testicle. He was on salvage chemotherapy with Rituxan/bendamustine/Velcade.  He did not really respond well to this.  We then treated him with dose reduced R-ICE.  He responded very well to this.  His last PET scan showed that he was in remission.    I do another PET scan on him in November.  He is certainly at significant risk for recurrence.  His iron studies were a little bit low the last time he was here so we gave him some IV iron.  He will be interesting to see what his iron level is today.  We will give him a dose of Aranesp today.  His quality of life is clearly dictated by the Foley catheter.  Hopefully this will not be a problem with this being a suprapubic catheter.  I will see him back in 6 weeks.  He still has his Port-A-Cath and so we will flush this.    Volanda Napoleon, MD 9/13/20192:52 PM

## 2017-09-22 ENCOUNTER — Telehealth: Payer: Self-pay | Admitting: *Deleted

## 2017-09-22 ENCOUNTER — Other Ambulatory Visit: Payer: Medicare Other

## 2017-09-22 ENCOUNTER — Ambulatory Visit: Payer: Medicare Other

## 2017-09-22 ENCOUNTER — Ambulatory Visit: Payer: Medicare Other | Admitting: Hematology & Oncology

## 2017-09-22 NOTE — Telephone Encounter (Addendum)
Patient is aware of results. Daughter will call back to schedule appointment. Message sent to scheduler.   ----- Message from Volanda Napoleon, MD sent at 09/20/2017  6:53 AM EDT ----- Call - iron is low!!!  Needs 1 dose of feraheme.  pete

## 2017-09-23 ENCOUNTER — Other Ambulatory Visit: Payer: Self-pay | Admitting: Family Medicine

## 2017-09-26 ENCOUNTER — Inpatient Hospital Stay: Payer: Medicare Other

## 2017-09-26 ENCOUNTER — Other Ambulatory Visit: Payer: Self-pay | Admitting: Family Medicine

## 2017-09-26 VITALS — BP 108/61 | HR 76 | Temp 97.8°F | Resp 16

## 2017-09-26 DIAGNOSIS — N184 Chronic kidney disease, stage 4 (severe): Principal | ICD-10-CM

## 2017-09-26 DIAGNOSIS — D631 Anemia in chronic kidney disease: Secondary | ICD-10-CM

## 2017-09-26 DIAGNOSIS — D500 Iron deficiency anemia secondary to blood loss (chronic): Secondary | ICD-10-CM

## 2017-09-26 DIAGNOSIS — C8589 Other specified types of non-Hodgkin lymphoma, extranodal and solid organ sites: Secondary | ICD-10-CM | POA: Diagnosis not present

## 2017-09-26 MED ORDER — SODIUM CHLORIDE 0.9 % IV SOLN
510.0000 mg | Freq: Once | INTRAVENOUS | Status: AC
  Administered 2017-09-26: 510 mg via INTRAVENOUS
  Filled 2017-09-26: qty 17

## 2017-09-26 MED ORDER — SODIUM CHLORIDE 0.9 % IV SOLN
Freq: Once | INTRAVENOUS | Status: AC
  Administered 2017-09-26: 12:00:00 via INTRAVENOUS
  Filled 2017-09-26: qty 250

## 2017-10-02 ENCOUNTER — Ambulatory Visit (INDEPENDENT_AMBULATORY_CARE_PROVIDER_SITE_OTHER): Payer: Medicare Other | Admitting: General Practice

## 2017-10-02 DIAGNOSIS — Z7901 Long term (current) use of anticoagulants: Secondary | ICD-10-CM

## 2017-10-02 DIAGNOSIS — I480 Paroxysmal atrial fibrillation: Secondary | ICD-10-CM

## 2017-10-02 LAB — POCT INR: INR: 2.6 (ref 2.0–3.0)

## 2017-10-02 NOTE — Patient Instructions (Signed)
Pre visit review using our clinic review tool, if applicable. No additional management support is needed unless otherwise documented below in the visit note.  Continue to take 7.5 daily except 5 mg on Monday/Wed/Fridays.  Re-check in 1 week. Dosing instructions given to Olivia Mackie, RN @ [facility] while in patient's home. [phone]

## 2017-10-04 ENCOUNTER — Other Ambulatory Visit: Payer: Self-pay | Admitting: Family Medicine

## 2017-10-06 ENCOUNTER — Other Ambulatory Visit: Payer: Self-pay

## 2017-10-06 ENCOUNTER — Telehealth: Payer: Self-pay | Admitting: Family Medicine

## 2017-10-06 ENCOUNTER — Other Ambulatory Visit: Payer: Self-pay | Admitting: Family Medicine

## 2017-10-06 MED ORDER — PRAMIPEXOLE DIHYDROCHLORIDE 1.5 MG PO TABS: 1.5000 mg | ORAL_TABLET | Freq: Two times a day (BID) | ORAL | 5 refills | Status: DC

## 2017-10-06 MED ORDER — PANTOPRAZOLE SODIUM 40 MG PO TBEC: 40.0000 mg | DELAYED_RELEASE_TABLET | Freq: Every day | ORAL | 0 refills | Status: DC

## 2017-10-06 NOTE — Telephone Encounter (Signed)
Refill for 6 months. 

## 2017-10-06 NOTE — Telephone Encounter (Signed)
Last OV 09/17/17, No future OV  Med last filled by Historical Provider. Please advise if you fill this or another doctor

## 2017-10-06 NOTE — Telephone Encounter (Signed)
Last OV 09/17/17, No future OV  Last filled by Historical Provider, do not see an updated Rx  Are you filling this medication?

## 2017-10-06 NOTE — Telephone Encounter (Signed)
Copied from [place] [id number]. Topic: General - Other >> Oct 06, 2017 11:04 AM Carolyn Stare wrote:  Pt request refill on pramipexole (MIRAPEX) 1.5 MG tablet   Pharmacy   [place]

## 2017-10-06 NOTE — Telephone Encounter (Signed)
Medication has been sent to the [facility] for the patient.

## 2017-10-06 NOTE — Telephone Encounter (Signed)
Yes.  Refill for 6 months.

## 2017-10-06 NOTE — Telephone Encounter (Signed)
Copied from [place] [id number]. Topic: Quick Communication - Rx Refill/Question >> Oct 06, 2017 12:05 PM Cecelia Byars, NT wrote: Medication:pantoprazole ([place]) 40 MG tablet  Has the patient contacted their pharmacy? yes (Agent: If no, request that the patient contact the pharmacy for the refill. (Agent: If yes, when and what did the pharmacy advise?  Per the pharmacy the patient has misplaced his medication   Preferred Pharmacy (with phone number or street name [id number] [place], [place] - [address] [phone] (Phone) [phone] (Fax)    Agent: Please be advised that RX refills may take up to 3 business days. We ask that you follow-up with your pharmacy.

## 2017-10-09 ENCOUNTER — Ambulatory Visit (INDEPENDENT_AMBULATORY_CARE_PROVIDER_SITE_OTHER): Payer: Medicare Other | Admitting: General Practice

## 2017-10-09 ENCOUNTER — Telehealth: Payer: Self-pay | Admitting: Cardiovascular Disease

## 2017-10-09 DIAGNOSIS — I480 Paroxysmal atrial fibrillation: Secondary | ICD-10-CM

## 2017-10-09 DIAGNOSIS — Z7901 Long term (current) use of anticoagulants: Secondary | ICD-10-CM | POA: Diagnosis not present

## 2017-10-09 LAB — POCT INR: INR: 2.6 (ref 2.0–3.0)

## 2017-10-09 MED ORDER — FUROSEMIDE 40 MG PO TABS: ORAL_TABLET | ORAL | 0 refills | Status: DC

## 2017-10-09 MED ORDER — METOPROLOL SUCCINATE ER 25 MG PO TB24: 25.0000 mg | ORAL_TABLET | Freq: Every day | ORAL | 11 refills | Status: DC

## 2017-10-09 NOTE — Telephone Encounter (Signed)
I spoke with Estill Bamberg. She reports she was at Gary Yates's home in last few weeks and Gary Yates had episode where he became dizzy, weak and had cloudy vision when walking down the hall. BP was 80/60.  He stood there for a few minutes and felt better.  Gary Yates reports to nurse he continues to have similar episodes.  Has not passed out. Has not fallen.  Weight is 208 lbs. No increase in edema. Lungs clear. BP today was 130/70 prior to taking AM medications.  Most BP's have been running low 100's/60-70. Heart rate 70-80.  Estill Bamberg is asking if any changes need to be made

## 2017-10-09 NOTE — Telephone Encounter (Signed)
New Message    Amanda-Home health nurse is calling because the pt is wanting to know if any adjustments need to be made to his med or if it is anything he needs to do less of due to his syncope and weight lost. Please call

## 2017-10-09 NOTE — Telephone Encounter (Signed)
We can lower his Toprol to 25 mg daily. Can we find out how much Lasix he is taking? If he is taking once per day, then lower to every other day for now but if weight goes up or LE edema worsens, go back to daily Lasix. Thanks, chris

## 2017-10-09 NOTE — Telephone Encounter (Signed)
I spoke with Estill Bamberg and gave her information from Dr. Angelena Form.  Will [facility] [address]

## 2017-10-09 NOTE — Patient Instructions (Addendum)
Pre visit review using our clinic review tool, if applicable. No additional management support is needed unless otherwise documented below in the visit note.  Continue to take 7.5 daily except 5 mg on Monday/Wed/Fridays.  Re-check in 1 week. Dosing instructions given to Olivia Mackie, RN @ [facility] while in patient's home. [phone]

## 2017-10-09 NOTE — Telephone Encounter (Signed)
Gary Yates confirms pt it taking Lasix 40 mg daily. He will lower to every other day.

## 2017-10-21 ENCOUNTER — Ambulatory Visit (INDEPENDENT_AMBULATORY_CARE_PROVIDER_SITE_OTHER): Payer: Medicare Other | Admitting: General Practice

## 2017-10-21 DIAGNOSIS — I480 Paroxysmal atrial fibrillation: Secondary | ICD-10-CM

## 2017-10-21 DIAGNOSIS — Z7901 Long term (current) use of anticoagulants: Secondary | ICD-10-CM

## 2017-10-21 LAB — POCT INR: INR: 5.1 — AB (ref 2.0–3.0)

## 2017-10-21 NOTE — Patient Instructions (Signed)
Pre visit review using our clinic review tool, if applicable. No additional management support is needed unless otherwise documented below in the visit note.  Hold coumadin today, tomorrow and Thursday and then take 5 mg through Monday.  On Tuesday continue to take 7.5 daily except 5 mg on Monday/Wed/Fridays. Dosing instructions given to Olivia Mackie, RN @ [facility] while in patient's home. [phone]  Re-check in 2 weeks.  Patient is going out of town.

## 2017-11-05 ENCOUNTER — Ambulatory Visit (INDEPENDENT_AMBULATORY_CARE_PROVIDER_SITE_OTHER): Payer: Medicare Other | Admitting: General Practice

## 2017-11-05 DIAGNOSIS — Z7901 Long term (current) use of anticoagulants: Secondary | ICD-10-CM | POA: Diagnosis not present

## 2017-11-05 DIAGNOSIS — I480 Paroxysmal atrial fibrillation: Secondary | ICD-10-CM

## 2017-11-05 LAB — POCT INR: INR: 3.1 — AB (ref 2.0–3.0)

## 2017-11-05 NOTE — Patient Instructions (Signed)
Pre visit review using our clinic review tool, if applicable. No additional management support is needed unless otherwise documented below in the visit note.  Hold coumadin today and continue to take 7.5 daily except 5 mg on Monday/Wed/Fridays. Dosing instructions given to Olivia Mackie, RN @ [facility] while in patient's home. [phone]  Re-check in 2 weeks.  Patient is going out of town.

## 2017-11-05 NOTE — Progress Notes (Signed)
I have reviewed this visit and I agree on the patient's plan of dosage and recommendations. Atanacio Melnyk, DO   

## 2017-11-08 ENCOUNTER — Other Ambulatory Visit: Payer: Self-pay | Admitting: Family Medicine

## 2017-11-12 ENCOUNTER — Ambulatory Visit (INDEPENDENT_AMBULATORY_CARE_PROVIDER_SITE_OTHER): Payer: Medicare Other | Admitting: General Practice

## 2017-11-12 ENCOUNTER — Telehealth: Payer: Self-pay | Admitting: Family Medicine

## 2017-11-12 DIAGNOSIS — I480 Paroxysmal atrial fibrillation: Secondary | ICD-10-CM

## 2017-11-12 DIAGNOSIS — Z7901 Long term (current) use of anticoagulants: Secondary | ICD-10-CM | POA: Diagnosis not present

## 2017-11-12 LAB — POCT INR: INR: 1.8 — AB (ref 2.0–3.0)

## 2017-11-12 NOTE — Telephone Encounter (Signed)
I am totally confused.  I do not see any consult for the referenced date (12/18) with Dr Brett Fairy.   Am I missing something?  We referred after 3/19 visit for sleep consult.  If he is refusing sleep study I don't think we need to pursue any other consultation for this issue.

## 2017-11-12 NOTE — Telephone Encounter (Signed)
[name] called this morning and cancelled his sleep consult with Dr. Brett Fairy. He is scheduled to do the home sleep study 11/18/17

## 2017-11-12 NOTE — Telephone Encounter (Signed)
°  Please advise - Dr Melchor Amour, Marvis Moeller    Can you tell me why Dr. Elease Hashimoto ordered another Sleep Consult with Dr. Brett Fairy on 12/18. Dr. Halford Chessman just saw the patient on 05/07/2017 as a sleep consult and I have been trying since that day to schedule the home sleep study but the patient would always have something else going on with his health or his life. Mr. Huckins doesn't even remember seeing Dr. Halford Chessman on 05/07/17. Please advise what Dr. Elease Hashimoto wants to do for this patient. Mr. Spalla doesn't want to do an in lab sleep study. Should he do a 2nd sleep consult?   Thanks,  Rodena Piety

## 2017-11-12 NOTE — Patient Instructions (Signed)
Pre visit review using our clinic review tool, if applicable. No additional management support is needed unless otherwise documented below in the visit note.  Take 1 1/2 tablets today and then continue to take 7.5 daily except 5 mg on Monday/Wed/Fridays. Dosing instructions given to Gary Mackie, RN @ [facility] while in patient's home. [phone]  Re-check in 2 weeks.

## 2017-11-14 ENCOUNTER — Inpatient Hospital Stay (HOSPITAL_BASED_OUTPATIENT_CLINIC_OR_DEPARTMENT_OTHER): Payer: Medicare Other | Admitting: Hematology & Oncology

## 2017-11-14 ENCOUNTER — Other Ambulatory Visit: Payer: Self-pay

## 2017-11-14 ENCOUNTER — Inpatient Hospital Stay: Payer: Medicare Other

## 2017-11-14 ENCOUNTER — Encounter: Payer: Self-pay | Admitting: Hematology & Oncology

## 2017-11-14 ENCOUNTER — Inpatient Hospital Stay: Payer: Medicare Other | Attending: Hematology & Oncology

## 2017-11-14 VITALS — BP 132/60 | HR 66 | Temp 98.2°F | Resp 18 | Wt 214.2 lb

## 2017-11-14 DIAGNOSIS — D631 Anemia in chronic kidney disease: Secondary | ICD-10-CM | POA: Diagnosis not present

## 2017-11-14 DIAGNOSIS — Z9225 Personal history of immunosupression therapy: Secondary | ICD-10-CM

## 2017-11-14 DIAGNOSIS — N184 Chronic kidney disease, stage 4 (severe): Secondary | ICD-10-CM | POA: Diagnosis not present

## 2017-11-14 DIAGNOSIS — M549 Dorsalgia, unspecified: Secondary | ICD-10-CM | POA: Insufficient documentation

## 2017-11-14 DIAGNOSIS — R600 Localized edema: Secondary | ICD-10-CM | POA: Diagnosis not present

## 2017-11-14 DIAGNOSIS — Z79899 Other long term (current) drug therapy: Secondary | ICD-10-CM | POA: Insufficient documentation

## 2017-11-14 DIAGNOSIS — Z923 Personal history of irradiation: Secondary | ICD-10-CM

## 2017-11-14 DIAGNOSIS — Z960 Presence of urogenital implants: Secondary | ICD-10-CM | POA: Diagnosis not present

## 2017-11-14 DIAGNOSIS — Z7901 Long term (current) use of anticoagulants: Secondary | ICD-10-CM

## 2017-11-14 DIAGNOSIS — Z9221 Personal history of antineoplastic chemotherapy: Secondary | ICD-10-CM | POA: Insufficient documentation

## 2017-11-14 DIAGNOSIS — C8589 Other specified types of non-Hodgkin lymphoma, extranodal and solid organ sites: Secondary | ICD-10-CM | POA: Insufficient documentation

## 2017-11-14 DIAGNOSIS — H538 Other visual disturbances: Secondary | ICD-10-CM

## 2017-11-14 DIAGNOSIS — D500 Iron deficiency anemia secondary to blood loss (chronic): Secondary | ICD-10-CM

## 2017-11-14 DIAGNOSIS — Z7984 Long term (current) use of oral hypoglycemic drugs: Secondary | ICD-10-CM

## 2017-11-14 DIAGNOSIS — J3489 Other specified disorders of nose and nasal sinuses: Secondary | ICD-10-CM | POA: Insufficient documentation

## 2017-11-14 DIAGNOSIS — R531 Weakness: Secondary | ICD-10-CM

## 2017-11-14 LAB — IRON AND TIBC
Iron: 67 ug/dL (ref 42–163)
SATURATION RATIOS: 31 % (ref 20–55)
TIBC: 216 ug/dL (ref 202–409)
UIBC: 149 ug/dL (ref 117–376)

## 2017-11-14 LAB — CMP (CANCER CENTER ONLY)
ALBUMIN: 3.4 g/dL — AB (ref 3.5–5.0)
ALK PHOS: 79 U/L (ref 26–84)
ALT: 12 U/L (ref 10–47)
AST: 27 U/L (ref 11–38)
Anion gap: 11 (ref 5–15)
BILIRUBIN TOTAL: 0.6 mg/dL (ref 0.2–1.6)
BUN: 15 mg/dL (ref 7–22)
CALCIUM: 9.3 mg/dL (ref 8.0–10.3)
CO2: 25 mmol/L (ref 18–33)
CREATININE: 0.9 mg/dL (ref 0.60–1.20)
Chloride: 106 mmol/L (ref 98–108)
Glucose, Bld: 111 mg/dL (ref 73–118)
Potassium: 4.2 mmol/L (ref 3.3–4.7)
Sodium: 142 mmol/L (ref 128–145)
Total Protein: 6.4 g/dL (ref 6.4–8.1)

## 2017-11-14 LAB — CBC WITH DIFFERENTIAL (CANCER CENTER ONLY)
ABS IMMATURE GRANULOCYTES: 0.02 10*3/uL (ref 0.00–0.07)
BASOS ABS: 0 10*3/uL (ref 0.0–0.1)
Basophils Relative: 1 %
Eosinophils Absolute: 0.2 10*3/uL (ref 0.0–0.5)
Eosinophils Relative: 3 %
HCT: 33.2 % — ABNORMAL LOW (ref 39.0–52.0)
HEMOGLOBIN: 10.7 g/dL — AB (ref 13.0–17.0)
IMMATURE GRANULOCYTES: 0 %
LYMPHS PCT: 20 %
Lymphs Abs: 1.1 10*3/uL (ref 0.7–4.0)
MCH: 31.4 pg (ref 26.0–34.0)
MCHC: 32.2 g/dL (ref 30.0–36.0)
MCV: 97.4 fL (ref 80.0–100.0)
Monocytes Absolute: 0.5 10*3/uL (ref 0.1–1.0)
Monocytes Relative: 9 %
NEUTROS ABS: 3.6 10*3/uL (ref 1.7–7.7)
NEUTROS PCT: 67 %
NRBC: 0 % (ref 0.0–0.2)
Platelet Count: 107 10*3/uL — ABNORMAL LOW (ref 150–400)
RBC: 3.41 MIL/uL — ABNORMAL LOW (ref 4.22–5.81)
RDW: 14.1 % (ref 11.5–15.5)
WBC Count: 5.4 10*3/uL (ref 4.0–10.5)

## 2017-11-14 LAB — FERRITIN: Ferritin: 509 ng/mL — ABNORMAL HIGH (ref 24–336)

## 2017-11-14 LAB — RETICULOCYTES
Immature Retic Fract: 13 % (ref 2.3–15.9)
RBC.: 3.41 MIL/uL — ABNORMAL LOW (ref 4.22–5.81)
RETIC COUNT ABSOLUTE: 43.6 10*3/uL (ref 19.0–186.0)
Retic Ct Pct: 1.3 % (ref 0.4–3.1)

## 2017-11-14 LAB — LACTATE DEHYDROGENASE: LDH: 166 U/L (ref 98–192)

## 2017-11-14 MED ORDER — SODIUM CHLORIDE 0.9% FLUSH
10.0000 mL | Freq: Once | INTRAVENOUS | Status: AC | PRN
  Administered 2017-11-14: 10 mL
  Filled 2017-11-14: qty 10

## 2017-11-14 MED ORDER — HEPARIN SOD (PORK) LOCK FLUSH 100 UNIT/ML IV SOLN
500.0000 [IU] | Freq: Once | INTRAVENOUS | Status: AC | PRN
  Administered 2017-11-14: 500 [IU]
  Filled 2017-11-14: qty 5

## 2017-11-14 NOTE — Patient Instructions (Signed)
Implanted Port Home Guide An implanted port is a type of central line that is placed under the skin. Central lines are used to provide IV access when treatment or nutrition needs to be given through a person's veins. Implanted ports are used for long-term IV access. An implanted port may be placed because:  You need IV medicine that would be irritating to the small veins in your hands or arms.  You need long-term IV medicines, such as antibiotics.  You need IV nutrition for a long period.  You need frequent blood draws for lab tests.  You need dialysis.  Implanted ports are usually placed in the chest area, but they can also be placed in the upper arm, the abdomen, or the leg. An implanted port has two main parts:  Reservoir. The reservoir is round and will appear as a small, raised area under your skin. The reservoir is the part where a needle is inserted to give medicines or draw blood.  Catheter. The catheter is a thin, flexible tube that extends from the reservoir. The catheter is placed into a large vein. Medicine that is inserted into the reservoir goes into the catheter and then into the vein.  How will I care for my incision site? Do not get the incision site wet. Bathe or shower as directed by your health care provider. How is my port accessed? Special steps must be taken to access the port:  Before the port is accessed, a numbing cream can be placed on the skin. This helps numb the skin over the port site.  Your health care provider uses a sterile technique to access the port. ? Your health care provider must put on a mask and sterile gloves. ? The skin over your port is cleaned carefully with an antiseptic and allowed to dry. ? The port is gently pinched between sterile gloves, and a needle is inserted into the port.  Only "non-coring" port needles should be used to access the port. Once the port is accessed, a blood return should be checked. This helps ensure that the port  is in the vein and is not clogged.  If your port needs to remain accessed for a constant infusion, a clear (transparent) bandage will be placed over the needle site. The bandage and needle will need to be changed every week, or as directed by your health care provider.  Keep the bandage covering the needle clean and dry. Do not get it wet. Follow your health care provider's instructions on how to take a shower or bath while the port is accessed.  If your port does not need to stay accessed, no bandage is needed over the port.  What is flushing? Flushing helps keep the port from getting clogged. Follow your health care provider's instructions on how and when to flush the port. Ports are usually flushed with saline solution or a medicine called heparin. The need for flushing will depend on how the port is used.  If the port is used for intermittent medicines or blood draws, the port will need to be flushed: ? After medicines have been given. ? After blood has been drawn. ? As part of routine maintenance.  If a constant infusion is running, the port may not need to be flushed.  How long will my port stay implanted? The port can stay in for as long as your health care provider thinks it is needed. When it is time for the port to come out, surgery will be   done to remove it. The procedure is similar to the one performed when the port was put in. When should I seek immediate medical care? When you have an implanted port, you should seek immediate medical care if:  You notice a bad smell coming from the incision site.  You have swelling, redness, or drainage at the incision site.  You have more swelling or pain at the port site or the surrounding area.  You have a fever that is not controlled with medicine.  This information is not intended to replace advice given to you by your health care provider. Make sure you discuss any questions you have with your health care provider. Document  Released: 12/24/2004 Document Revised: 06/01/2015 Document Reviewed: 08/31/2012 [employer] Interactive Patient Education  2017 [employer]  

## 2017-11-14 NOTE — Progress Notes (Signed)
Hematology and Oncology Follow Up Visit  Gary Yates [id number] 08-11-1933 84 y.o. 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  He actually looks quite good.  He has had a good fall so far.  His house is degraded for Christmas already.  He and his wife might be going to assisted living.  They will keep thinking about this.  He still has the indwelling Foley catheter in.  [facility] comes out [facility] help with this.  He has had no bleeding.  He has had no fever.  He is on Coumadin.  I will get a PET scan on him after Thanksgiving.    His leg edema seems to be a little bit better also.  He has had no rashes.  There is been no cough or shortness of breath.  He has had no nausea or vomiting.  Overall, his performance status is ECOG 2.   Medications:  Current Outpatient Medications:  .  ACCU-CHEK AVIVA PLUS test strip, TEST 3 TIMES A DAY (Patient taking differently: TEST 2 TIMES A DAY), Disp: 300 each, Rfl: 5 .  acetaminophen (TYLENOL) 500 MG tablet, Take 1,000 mg by mouth every 6 (six) hours as needed (for pain/fever/headaches.). , Disp: , Rfl:  .  atorvastatin (LIPITOR) 40 MG tablet, TAKE 1 TABLET BY MOUTH EVERY DAY, Disp: 90 tablet, Rfl: 0 .  docusate sodium (COLACE) 50 MG capsule, Take 1 capsule (50 mg total) by mouth 2 (two) times daily as needed (for constipation)., Disp: 90 capsule, Rfl: 0 .  furosemide (LASIX) 40 MG tablet, Take one tablet by mouth every other  day., Disp: 30 tablet, Rfl: 0 .  gabapentin (NEURONTIN) 300 MG capsule, Take 300 mg by mouth 3 (three) times daily., Disp: , Rfl:  .  HYDROcodone-acetaminophen (NORCO) 10-325 MG tablet, Take 1-2 tablets by mouth every 4 (four) hours as needed for moderate pain. Maximum dose per 24 hours - 8 pills, Disp: 20 tablet, Rfl: 0 .  KLOR-CON M20 20 MEQ tablet, TAKE 1 TABLET BY MOUTH EVERY DAY, Disp: 90 tablet, Rfl: 0 .  magic mouthwash w/lidocaine SOLN, Take 5 mLs by mouth 4 (four) times daily as needed for mouth pain. 1:1 ratio. DIPHENHYDRAMINE HCL (ANTIHISTAMINES - ETHANOLAMINES),ALUM & MAG HYDROXIDE-SIMETH,NYSTATIN (ANTI-INFECTIVES - THROAT),LIDOCAINE HCL (ANESTHETICS TOPICAL ORAL), Disp: 600 mL, Rfl: 3 .  Melatonin 1 MG TABS, Take 1 tablet by mouth at bedtime. , Disp: , Rfl:  .  metFORMIN (GLUCOPHAGE) 500 MG tablet, TAKE 1 TABLET BY MOUTH TWICE A DAY WITH A MEAL, Disp: 180 tablet, Rfl: 2 .  metoprolol succinate (TOPROL XL) 25 MG 24 hr tablet, Take 1 tablet (25 mg total) by mouth daily., Disp: 30 tablet, Rfl: 11 .  Multiple Vitamins-Minerals (MULTIVITAMIN WITH MINERALS) tablet, Take 1 tablet by mouth daily., Disp: , Rfl:  .  nitroGLYCERIN (NITROSTAT) 0.4 MG SL tablet, Place 1 tablet (  0.4 mg total) under the tongue every 5 (five) minutes as needed. Chest pain, Disp: 25 tablet, Rfl: 6 .  pantoprazole (PROTONIX) 40 MG tablet, TAKE 1 TABLET BY MOUTH EVERY DAY, Disp: 90 tablet, Rfl: 3 .  pantoprazole (PROTONIX) 40 MG tablet, Take 1 tablet (40 mg total) by mouth daily., Disp: 30 tablet, Rfl: 0 .  polyethylene glycol (MIRALAX / GLYCOLAX) packet, Take 17 g by mouth daily., Disp: 14 each, Rfl: 0 .  pramipexole (MIRAPEX) 1.5 MG tablet, TAKE 2 TABLETS BY MOUTH EVERY NIGHT, Disp: 180 tablet, Rfl: 1 .  pramipexole (MIRAPEX) 1.5 MG tablet, Take 1 tablet (1.5 mg total) by mouth 2 (two) times daily., Disp: 60 tablet, Rfl: 5 .  TOUJEO SOLOSTAR 300 UNIT/ML SOPN, INJECT 20 UNITS INTO THE SKIN DAILY AFTER BREAKFAST. (Patient  taking differently: Inject 15 Units into the skin daily after breakfast. ), Disp: 15 mL, Rfl: 1 .  vitamin B-12 (CYANOCOBALAMIN) 1000 MCG tablet, Take 1 tablet (1,000 mcg total) by mouth daily., Disp: 30 tablet, Rfl: 0 .  warfarin (COUMADIN) 5 MG tablet, Take as directed by anticoagulation clinic (Patient taking differently: Take 5-7.5 mg by mouth as directed. 5 mg Monday, Wed, and Fri, and 7.5 mg  All other days), Disp: 45 tablet, Rfl: 3 No current facility-administered medications for this visit.   Facility-Administered Medications Ordered in Other Visits:  .  acetaminophen (TYLENOL) tablet 650 mg, 650 mg, Oral, Once, [place], Sarah M, NP .  pegfilgrastim (NEULASTA ONPRO KIT) injection 6 mg, 6 mg, Subcutaneous, Once, Gabbie Marzo R, MD .  sodium chloride flush (NS) 0.9 % injection 10 mL, 10 mL, Intracatheter, PRN, Volanda Napoleon, MD, 10 mL at 02/27/17 1503 .  sodium chloride flush (NS) 0.9 % injection 10 mL, 10 mL, Intravenous, PRN, Volanda Napoleon, MD, 10 mL at 05/07/17 0258  Allergies:  Allergies  Allergen Reactions  . Ace Inhibitors Other (See Comments)    cough  . Codeine Nausea Only and Rash       . Penicillins Rash    Childhood allergy Has patient had a PCN reaction causing immediate rash, facial/tongue/throat swelling, SOB or lightheadedness with hypotension: Yes Has patient had a PCN reaction causing severe rash involving mucus membranes or skin necrosis: Yes Has patient had a PCN reaction that required hospitalization No Has patient had a PCN reaction occurring within the last 10 years: No If all of the above answers are "NO", then may proceed with Cephalosporin use.     Past Medical History, Surgical history, Social history, and Family History were reviewed and updated.  Review of Systems: Review of Systems  HENT: Positive for sinus pain and sore throat.   Eyes: Positive for blurred vision.  Respiratory: Positive for wheezing.   Cardiovascular: Positive for leg  swelling.  Gastrointestinal: Negative.   Genitourinary: Negative.   Musculoskeletal: Positive for back pain.  Skin: Negative.   Neurological: Positive for weakness.  Endo/Heme/Allergies: Negative.   Psychiatric/Behavioral: Negative.     Physical Exam:  weight is 214 lb 4 oz (97.2 kg). His oral temperature is 98.2 F (36.8 C). His blood pressure is 132/60 and his pulse is 66. His respiration is 18 and oxygen saturation is 99%.   Wt Readings from Last 3 Encounters:  11/14/17 214 lb 4 oz (97.2 kg)  09/19/17 212 lb (96.2 kg)  09/17/17 215 lb 11.2 oz (97.8 kg)      Physical Exam  Constitutional: He is oriented to person, place, and time.  HENT:  Head: Normocephalic and atraumatic.  Mouth/Throat: Oropharynx is clear and moist.  Eyes: Pupils are equal, round, and reactive to light. EOM are normal.  Neck: Normal range of motion.  Cardiovascular: Normal rate, regular rhythm and normal heart sounds.  Pulmonary/Chest: Effort normal and breath sounds normal.  Abdominal: Soft. Bowel sounds are normal.  Musculoskeletal: Normal range of motion. He exhibits no edema, tenderness or deformity.  Lymphadenopathy:    He has no cervical adenopathy.  Neurological: He is alert and oriented to person, place, and time.  Skin: Skin is warm and dry. No rash noted. No erythema.  Psychiatric: He has a normal mood and affect. His behavior is normal. Judgment and thought content normal.  Vitals reviewed.    Lab Results  Component Value Date   WBC 5.4 11/14/2017   HGB 10.7 (L) 11/14/2017   HCT 33.2 (L) 11/14/2017   MCV 97.4 11/14/2017   PLT 107 (L) 11/14/2017     Chemistry      Component Value Date/Time   NA 135 09/19/2017 1130   NA 141 01/10/2017 1136   NA 137 12/14/2015 1100   K 3.9 09/19/2017 1130   K 4.4 01/10/2017 1136   K 3.7 12/14/2015 1100   CL 108 09/19/2017 1130   CL 104 01/10/2017 1136   CO2 28 09/19/2017 1130   CO2 27 01/10/2017 1136   CO2 24 12/14/2015 1100   BUN 12  09/19/2017 1130   BUN 19 01/10/2017 1136   BUN 18.4 12/14/2015 1100   CREATININE 1.10 09/19/2017 1130   CREATININE 1.1 01/10/2017 1136   CREATININE 0.9 12/14/2015 1100      Component Value Date/Time   CALCIUM 8.8 09/19/2017 1130   CALCIUM 9.4 01/10/2017 1136   CALCIUM 9.5 12/14/2015 1100   ALKPHOS 67 09/19/2017 1130   ALKPHOS 112 (H) 01/10/2017 1136   ALKPHOS 102 12/14/2015 1100   AST 22 09/19/2017 1130   AST 22 12/14/2015 1100   ALT 11 09/19/2017 1130   ALT 18 01/10/2017 1136   ALT 10 12/14/2015 1100   BILITOT 0.7 09/19/2017 1130   BILITOT 0.58 12/14/2015 1100      Impression and Plan: Mr. Bluett is an 84 year old white male. He has relapsed large cell non-Hodgkin lymphoma of the testicle. He was on salvage chemotherapy with Rituxan/bendamustine/Velcade.  He did not really respond well to this.  We then treated him with dose reduced R-ICE.  He responded very well to this.  His last PET scan showed that he was in remission.    We will plan to get him back to see [place] in another 2 months.  I think this would be reasonable.  Again the PET scan will be done after Thanksgiving.     Volanda Napoleon, MD 11/8/20198:41 AM

## 2017-11-18 DIAGNOSIS — G4733 Obstructive sleep apnea (adult) (pediatric): Secondary | ICD-10-CM | POA: Diagnosis not present

## 2017-11-18 NOTE — Telephone Encounter (Signed)
[name] and son picked up the HST machine today 11/18/2017

## 2017-11-25 ENCOUNTER — Other Ambulatory Visit: Payer: Self-pay | Admitting: *Deleted

## 2017-11-25 DIAGNOSIS — R0683 Snoring: Secondary | ICD-10-CM

## 2017-11-26 ENCOUNTER — Telehealth: Payer: Self-pay | Admitting: Pulmonary Disease

## 2017-11-26 ENCOUNTER — Encounter: Payer: Self-pay | Admitting: Pulmonary Disease

## 2017-11-26 ENCOUNTER — Ambulatory Visit (INDEPENDENT_AMBULATORY_CARE_PROVIDER_SITE_OTHER): Payer: Medicare Other | Admitting: General Practice

## 2017-11-26 DIAGNOSIS — G4733 Obstructive sleep apnea (adult) (pediatric): Secondary | ICD-10-CM | POA: Diagnosis not present

## 2017-11-26 DIAGNOSIS — Z7901 Long term (current) use of anticoagulants: Secondary | ICD-10-CM | POA: Diagnosis not present

## 2017-11-26 DIAGNOSIS — I480 Paroxysmal atrial fibrillation: Secondary | ICD-10-CM

## 2017-11-26 HISTORY — DX: Obstructive sleep apnea (adult) (pediatric): G47.33

## 2017-11-26 LAB — POCT INR: INR: 2.4 (ref 2.0–3.0)

## 2017-11-26 NOTE — Patient Instructions (Signed)
Pre visit review using our clinic review tool, if applicable. No additional management support is needed unless otherwise documented below in the visit note.  Continue to take 7.5 daily except 5 mg on Monday/Wed/Fridays. Dosing instructions given to Olivia Mackie, RN @ [facility] while in patient's home. [phone]  Re-check in 2 weeks.

## 2017-11-26 NOTE — Progress Notes (Signed)
I have reviewed and agree with note, evaluation, plan.   Arlan Birks, MD  

## 2017-11-26 NOTE — Telephone Encounter (Signed)
HST 11/18/17 >> AHI 26.8, SpO2 low 67%   Please let him know that the sleep study showed moderate obstructive sleep apnea.  Please arrange for ROV with a nurse practitioner to review treatment options.

## 2017-11-28 NOTE — Telephone Encounter (Signed)
Patient daughter returned phone call  ?

## 2017-11-28 NOTE — Telephone Encounter (Signed)
Spoke with pt's daughter Lattie Haw (dpr on file), aware of results/recs.  Scheduled OV with Aaron Edelman on 11/27 to discuss options per VS' recs.  Nothing further needed at this time.

## 2017-11-28 NOTE — Telephone Encounter (Signed)
Called and spoke to pt's daughter, Lattie Haw [facility]) to relay results.  Lattie Haw requested that I call back in 10 minutes to do so.  I recommended that [facility] contact our office when available.

## 2017-12-02 ENCOUNTER — Ambulatory Visit: Payer: Medicare Other | Admitting: Pulmonary Disease

## 2017-12-02 ENCOUNTER — Encounter: Payer: Self-pay | Admitting: Pulmonary Disease

## 2017-12-02 VITALS — BP 124/58 | HR 79 | Ht 70.0 in | Wt 218.2 lb

## 2017-12-02 DIAGNOSIS — R600 Localized edema: Secondary | ICD-10-CM

## 2017-12-02 DIAGNOSIS — G4733 Obstructive sleep apnea (adult) (pediatric): Secondary | ICD-10-CM

## 2017-12-02 NOTE — Assessment & Plan Note (Signed)
Will place an order for a new CPAP Mask of choice APAP settings 5-15 Supplies for a year DME: [facility]  We recommend that you continue using your CPAP daily >>>Keep up the hard work using your device >>> Goal should be wearing this for the entire night that you are sleeping, at least 4 to 6 hours  Remember:  . Do not drive or operate heavy machinery if tired or drowsy.  . Please notify the supply company and office if you are unable to use your device regularly due to missing supplies or machine being broken.  . Work on maintaining a healthy weight and following your recommended nutrition plan  . Maintain proper daily exercise and movement  . Maintaining proper use of your device can also help improve management of other chronic illnesses such as: Blood pressure, blood sugars, and weight management.   BiPAP/ CPAP Cleaning:  >>>Clean weekly, with Dawn soap, and bottle brush.  Set up to air dry.   Follow up in 2 months with our office  >>> Ensure you have been using the device for 30 days at least

## 2017-12-02 NOTE — Assessment & Plan Note (Signed)
Continue compression stockings Continue to follow low-sodium diet Follow-up with primary care regarding lower extremity swelling as well as inability to take 40 mg of Lasix daily

## 2017-12-02 NOTE — Progress Notes (Signed)
@Patient  ID: Gary Yates., male    DOB: 1933-08-19, 84 y.o.   MRN: [id number]  Chief Complaint  Patient presents with  . Follow-up    OSA     Referring provider: [name], MD  HPI:  84 year old male patient followed in our office for obstructive sleep apnea  PMH: A. fib, hypertension, COPD, GERD, type 2 diabetes Smoker/ Smoking History: Former smoker.  30-pack-year smoking history. Maintenance: None Pt of: Patient of Dr. Halford Chessman   12/02/2017  - Visit   84 year old male patient complaining follow-up with our office today after completion of home sleep study.  Home sleep study showing moderate obstructive sleep apnea.  Patient is presenting today with his family to discuss those results as well as therapy.  Son is present with patient as well as patient's spouse.  Son currently is managed on CPAP therapy.  Patient continues to report fatigue as well as increased daytime sleepiness.  Patient is not adherent to taking his Lasix regularly.  Patient reports that is too difficult for him to have to urinate so much with all of his different doctors appointments.   Tests:  HST 11/18/17 >> AHI 26.8, SpO2 low 67%  Echo 09/20/15 >> EF 55 to 60%, grade 1 DD, mild MR  Results of the Epworth flowsheet 05/07/2017  Sitting and reading 3  Watching TV 3  Sitting, inactive in a public place (e.g. a theatre or a meeting) 3  As a passenger in a car for an hour without a break 3  Lying down to rest in the afternoon when circumstances permit 3  Sitting and talking to someone 3  Sitting quietly after a lunch without alcohol 3  In a car, while stopped for a few minutes in traffic 0  Total score 21   FENO:  No results found for: NITRICOXIDE  PFT: No flowsheet data found.  Imaging: No results found.  Chart Review:    Specialty Problems      Pulmonary Problems   COPD    Qualifier: Diagnosis of  By: Tiney Rouge CMA, Ellison Hughs        COPD (chronic obstructive pulmonary disease)  (HCC)   OSA (obstructive sleep apnea)      Allergies  Allergen Reactions  . Ace Inhibitors Other (See Comments)    cough  . Codeine Nausea Only and Rash       . Penicillins Rash    Childhood allergy Has patient had a PCN reaction causing immediate rash, facial/tongue/throat swelling, SOB or lightheadedness with hypotension: Yes Has patient had a PCN reaction causing severe rash involving mucus membranes or skin necrosis: Yes Has patient had a PCN reaction that required hospitalization No Has patient had a PCN reaction occurring within the last 10 years: No If all of the above answers are "NO", then may proceed with Cephalosporin use.     Immunization History  Administered Date(s) Administered  . Influenza Split 10/03/2010, 10/10/2011  . Influenza Whole 11/09/2008, 09/19/2009  . Influenza, High Dose Seasonal PF 09/21/2012, 10/26/2014, 09/10/2016, 09/11/2017  . Influenza,inj,Quad PF,6+ Mos 11/04/2013  . Influenza-Unspecified 09/30/2015, 09/11/2017  . Pneumococcal Conjugate-13 08/04/2013  . Pneumococcal Polysaccharide-23 01/08/2003  . Td 05/16/2005  . Tdap 05/10/2015  . Zoster 08/04/2013  . Zoster Recombinat (Shingrix) 03/15/2017, 09/08/2017    Past Medical History:  Diagnosis Date  . Anemia in chronic renal disease 05/07/2017  . Anxiety   . Atrial fibrillation ([place])   . COPD (chronic obstructive pulmonary disease) ([place])  pt. denies  . Coronary artery disease    a. h/o Overlapping stents RCA;  b. 06/2011 Cath: patent stents, nonobs dzs, NL EF.  . Diabetic peripheral neuropathy ([place])   . Diffuse non-Hodgkin's lymphoma of testis ([place]) 09/28/2015  . DM (diabetes mellitus) ([place])    Type 2, peripheral neuropathy.  . Dyspnea    with exertion  . Dysrhythmia   . GERD (gastroesophageal reflux disease)   . Headache   . History of bronchitis   . History of kidney stones   . History of radiation therapy 02/19/16 - 03/13/16   Testis/Scrotum: 32.4 Gy in 18 fractions  . History of  radiation therapy 08/07/16-08/20/16   left adrenal gland mass treated to 30 Gy in 10 fractions  . Hyperlipidemia   . Hypertension   . Iron deficiency anemia due to chronic blood loss 08/08/2017  . Low testosterone   . Nephrolithiasis   . OSA (obstructive sleep apnea) 11/26/2017  . Osteoarthritis    shoulder  . Restless leg   . SVT (supraventricular tachycardia) ([place])   . Urinary frequency   . Wears partial dentures    upper and lower    Tobacco History: Social History   Tobacco Use  Smoking Status Former Smoker  . Packs/day: 1.50  . Years: 20.00  . Pack years: 30.00  . Types: Cigarettes  . Last attempt to quit: 04/05/1978  . Years since quitting: 39.6  Smokeless Tobacco Never Used   Counseling given: Yes Continue to not smoke  Outpatient Encounter Medications as of 12/02/2017  Medication Sig  . ACCU-CHEK AVIVA PLUS test strip TEST 3 TIMES A DAY (Patient taking differently: TEST 2 TIMES A DAY)  . acetaminophen (TYLENOL) 500 MG tablet Take 1,000 mg by mouth every 6 (six) hours as needed (for pain/fever/headaches.).   Marland Kitchen atorvastatin (LIPITOR) 40 MG tablet TAKE 1 TABLET BY MOUTH EVERY DAY  . docusate sodium (COLACE) 50 MG capsule Take 1 capsule (50 mg total) by mouth 2 (two) times daily as needed (for constipation).  . furosemide (LASIX) 40 MG tablet Take one tablet by mouth every other day.  . gabapentin (NEURONTIN) 300 MG capsule Take 900 mg by mouth 2 (two) times daily.   Marland Kitchen KLOR-CON M20 20 MEQ tablet TAKE 1 TABLET BY MOUTH EVERY DAY  . magic mouthwash w/lidocaine SOLN Take 5 mLs by mouth 4 (four) times daily as needed for mouth pain. 1:1 ratio. DIPHENHYDRAMINE HCL (ANTIHISTAMINES - ETHANOLAMINES),ALUM & MAG HYDROXIDE-SIMETH,NYSTATIN (ANTI-INFECTIVES - THROAT),LIDOCAINE HCL (ANESTHETICS TOPICAL ORAL)  . metFORMIN (GLUCOPHAGE) 500 MG tablet TAKE 1 TABLET BY MOUTH TWICE A DAY WITH A MEAL  . metoprolol succinate (TOPROL XL) 25 MG 24 hr tablet Take 1 tablet (25 mg total) by mouth  daily.  . Multiple Vitamins-Minerals (MULTIVITAMIN WITH MINERALS) tablet Take 1 tablet by mouth daily.  . nitroGLYCERIN (NITROSTAT) 0.4 MG SL tablet Place 1 tablet (0.4 mg total) under the tongue every 5 (five) minutes as needed. Chest pain  . polyethylene glycol (MIRALAX / GLYCOLAX) packet Take 17 g by mouth daily.  . pramipexole (MIRAPEX) 1.5 MG tablet Take 1 tablet (1.5 mg total) by mouth 2 (two) times daily.  . TOUJEO SOLOSTAR 300 UNIT/ML SOPN INJECT 20 UNITS INTO THE SKIN DAILY AFTER BREAKFAST. (Patient taking differently: Inject 15 Units into the skin daily after breakfast. )  . vitamin B-12 (CYANOCOBALAMIN) 1000 MCG tablet Take 1 tablet (1,000 mcg total) by mouth daily.  Marland Kitchen warfarin (COUMADIN) 5 MG tablet Take as directed by anticoagulation clinic (  Patient taking differently: Take 5-7.5 mg by mouth as directed. 5 mg Monday, Wed, and Fri, and 7.5 mg  All other days)  . [DISCONTINUED] pramipexole (MIRAPEX) 1.5 MG tablet TAKE 2 TABLETS BY MOUTH EVERY NIGHT  . HYDROcodone-acetaminophen (NORCO) 10-325 MG tablet Take 1-2 tablets by mouth every 4 (four) hours as needed for moderate pain. Maximum dose per 24 hours - 8 pills (Patient not taking: Reported on 12/02/2017)  . Melatonin 1 MG TABS Take 1 tablet by mouth at bedtime.   . pantoprazole (PROTONIX) 40 MG tablet TAKE 1 TABLET BY MOUTH EVERY DAY (Patient not taking: Reported on 12/02/2017)  . [DISCONTINUED] metoprolol (LOPRESSOR) 50 MG tablet   . [DISCONTINUED] pantoprazole (PROTONIX) 40 MG tablet Take 1 tablet (40 mg total) by mouth daily. (Patient not taking: Reported on 12/02/2017)  . [DISCONTINUED] spironolactone (ALDACTONE) 25 MG tablet    Facility-Administered Encounter Medications as of 12/02/2017  Medication  . acetaminophen (TYLENOL) tablet 650 mg  . pegfilgrastim (NEULASTA ONPRO KIT) injection 6 mg  . sodium chloride flush (NS) 0.9 % injection 10 mL  . sodium chloride flush (NS) 0.9 % injection 10 mL     Review of Systems  Review  of Systems  Constitutional: Positive for fatigue. Negative for chills and fever.  HENT: Negative for congestion and postnasal drip.   Respiratory: Negative for cough, chest tightness, shortness of breath and wheezing.   Cardiovascular: Negative for chest pain and palpitations.  Gastrointestinal: Negative for diarrhea, nausea and vomiting.  Genitourinary: Negative for hematuria and urgency.  Skin: Negative for color change and rash.  Neurological: Negative for dizziness, light-headedness and headaches.  Psychiatric/Behavioral: Negative for agitation and dysphoric mood. The patient is not nervous/anxious.   All other systems reviewed and are negative.    Physical Exam  BP (!) 124/58 (BP Location: Left Arm, Cuff Size: Normal)   Pulse 79   Ht 5' 10"  (1.778 m)   Wt 218 lb 3.2 oz (99 kg)   SpO2 96%   BMI 31.31 kg/m   Wt Readings from Last 5 Encounters:  12/02/17 218 lb 3.2 oz (99 kg)  11/14/17 214 lb 4 oz (97.2 kg)  09/19/17 212 lb (96.2 kg)  09/17/17 215 lb 11.2 oz (97.8 kg)  09/11/17 212 lb 12.8 oz (96.5 kg)     Physical Exam  Constitutional: He is oriented to person, place, and time and well-developed, well-nourished, and in no distress. No distress.  HENT:  Head: Normocephalic and atraumatic.  Right Ear: Hearing, tympanic membrane, external ear and ear canal normal.  Left Ear: Hearing, tympanic membrane, external ear and ear canal normal.  Nose: Mucosal edema present. Right sinus exhibits no maxillary sinus tenderness and no frontal sinus tenderness. Left sinus exhibits no maxillary sinus tenderness and no frontal sinus tenderness.  Mouth/Throat: Uvula is midline and oropharynx is clear and moist. No oropharyngeal exudate.  +mallampati 1  Eyes: Pupils are equal, round, and reactive to light.  Neck: Normal range of motion. Neck supple.  Cardiovascular: Normal rate, regular rhythm and normal heart sounds.  Pulmonary/Chest: Effort normal and breath sounds normal. No accessory  muscle usage. No respiratory distress. He has no decreased breath sounds. He has no wheezes. He has no rhonchi.  Musculoskeletal: Normal range of motion. He exhibits edema (1-2+ with LE edema with compression stockings on ).  Lymphadenopathy:    He has no cervical adenopathy.  Neurological: He is alert and oriented to person, place, and time.  Uses walker  Skin: Skin is warm  and dry. He is not diaphoretic. No erythema.  Psychiatric: Mood, memory, affect and judgment normal.  Nursing note and vitals reviewed.     Lab Results:  CBC    Component Value Date/Time   WBC 5.4 11/14/2017 0810   WBC 2.8 (L) 08/23/2017 1455   RBC 3.41 (L) 11/14/2017 0810   RBC 3.41 (L) 11/14/2017 0810   HGB 10.7 (L) 11/14/2017 0810   HGB 10.5 (L) 01/10/2017 1136   HCT 33.2 (L) 11/14/2017 0810   HCT 31.5 (L) 01/10/2017 1136   PLT 107 (L) 11/14/2017 0810   PLT 123 (L) 01/10/2017 1136   MCV 97.4 11/14/2017 0810   MCV 95 01/10/2017 1136   MCH 31.4 11/14/2017 0810   MCHC 32.2 11/14/2017 0810   RDW 14.1 11/14/2017 0810   RDW 14.2 01/10/2017 1136   LYMPHSABS 1.1 11/14/2017 0810   LYMPHSABS 0.9 01/10/2017 1136   MONOABS 0.5 11/14/2017 0810   EOSABS 0.2 11/14/2017 0810   EOSABS 0.1 01/10/2017 1136   BASOSABS 0.0 11/14/2017 0810   BASOSABS 0.0 01/10/2017 1136    BMET    Component Value Date/Time   NA 142 11/14/2017 0810   NA 141 01/10/2017 1136   NA 137 12/14/2015 1100   K 4.2 11/14/2017 0810   K 4.4 01/10/2017 1136   K 3.7 12/14/2015 1100   CL 106 11/14/2017 0810   CL 104 01/10/2017 1136   CO2 25 11/14/2017 0810   CO2 27 01/10/2017 1136   CO2 24 12/14/2015 1100   GLUCOSE 111 11/14/2017 0810   GLUCOSE 132 (H) 01/10/2017 1136   BUN 15 11/14/2017 0810   BUN 19 01/10/2017 1136   BUN 18.4 12/14/2015 1100   CREATININE 0.90 11/14/2017 0810   CREATININE 1.1 01/10/2017 1136   CREATININE 0.9 12/14/2015 1100   CALCIUM 9.3 11/14/2017 0810   CALCIUM 9.4 01/10/2017 1136   CALCIUM 9.5 12/14/2015 1100     GFRNONAA >60 08/23/2017 1455   GFRNONAA >60 01/15/2017 0740   GFRAA >60 08/23/2017 1455   GFRAA >60 01/15/2017 0740    BNP    Component Value Date/Time   BNP 18.8 03/08/2014 1912    ProBNP    Component Value Date/Time   PROBNP 351.7 02/03/2013 1724      Assessment & Plan:   Pleasant 84 year old male patient seen for office visit today will start CPAP therapy.  [facility] would [facility].  Son who is present with patient today also uses [facility].  They would like for [facility] to come out to the house preferably we will request this but I cannot guarantee that service.  They understand.  Patient to follow-up with our office in 2 months  OSA (obstructive sleep apnea) Will place an order for a new CPAP Mask of choice APAP settings 5-15 Supplies for a year DME: [facility]  We recommend that you continue using your CPAP daily >>>Keep up the hard work using your device >>> Goal should be wearing this for the entire night that you are sleeping, at least 4 to 6 hours  Remember:  . Do not drive or operate heavy machinery if tired or drowsy.  . Please notify the supply company and office if you are unable to use your device regularly due to missing supplies or machine being broken.  . Work on maintaining a healthy weight and following your recommended nutrition plan  . Maintain proper daily exercise and movement  . Maintaining proper  use of your device can also help improve management of other chronic illnesses such as: Blood pressure, blood sugars, and weight management.   BiPAP/ CPAP Cleaning:  >>>Clean weekly, with Dawn soap, and bottle brush.  Set up to air dry.   Follow up in 2 months with our office  >>> Ensure you have been using the device for 30 days at least  Bilateral lower extremity edema Continue compression stockings Continue to follow low-sodium diet Follow-up with primary care regarding lower extremity  swelling as well as inability to take 40 mg of Lasix daily   This appointment was 28 minutes along with over 50% that time in direct face-to-face patient care, assessment, plan of care follow-up and discussion  Lauraine Rinne, NP 12/02/2017

## 2017-12-02 NOTE — Patient Instructions (Addendum)
Will place an order for a new CPAP Mask of choice APAP settings 5-15 Supplies for a year DME: [facility]  We recommend that you continue using your CPAP daily >>>Keep up the hard work using your device >>> Goal should be wearing this for the entire night that you are sleeping, at least 4 to 6 hours  Remember:  . Do not drive or operate heavy machinery if tired or drowsy.  . Please notify the supply company and office if you are unable to use your device regularly due to missing supplies or machine being broken.  . Work on maintaining a healthy weight and following your recommended nutrition plan  . Maintain proper daily exercise and movement  . Maintaining proper use of your device can also help improve management of other chronic illnesses such as: Blood pressure, blood sugars, and weight management.   BiPAP/ CPAP Cleaning:  >>>Clean weekly, with Dawn soap, and bottle brush.  Set up to air dry.   Follow up in 2 months with our office  >>> Ensure you have been using the device for 30 days at least  Follow-up with primary care regarding your Lasix dose     It is flu season:   >>>Remember to be washing your hands regularly, using hand sanitizer, be careful to use around herself with has contact with people who are sick will increase her chances of getting sick yourself. >>> Best ways to protect herself from the flu: Receive the yearly flu vaccine, practice good hand hygiene washing with soap and also using hand sanitizer when available, eat a nutritious meals, get adequate rest, hydrate appropriately   Please contact the office if your symptoms worsen or you have concerns that you are not improving.   Thank you for choosing [facility] for your healthcare, and for allowing [place] to partner with you on your healthcare journey. I am thankful to be able to provide care to you today.   Wyn Quaker FNP-C   Sleep Apnea Sleep apnea is a condition that affects  breathing. People with sleep apnea have moments during sleep when their breathing pauses briefly or gets shallow. Sleep apnea can cause these symptoms:  Trouble staying asleep.  Sleepiness or tiredness during the day.  Irritability.  Loud snoring.  Morning headaches.  Trouble concentrating.  Forgetting things.  Less interest in sex.  Being sleepy for no reason.  Mood swings.  Personality changes.  Depression.  Waking up a lot during the night to pee (urinate).  Dry mouth.  Sore throat.  Follow these instructions at home:  Make any changes in your routine that your doctor recommends.  Eat a healthy, well-balanced diet.  Take over-the-counter and prescription medicines only as told by your doctor.  Avoid using alcohol, calming medicines (sedatives), and narcotic medicines.  Take steps to lose weight if you are overweight.  If you were given a machine (device) to use while you sleep, use it only as told by your doctor.  Do not use any tobacco products, such as cigarettes, chewing tobacco, and e-cigarettes. If you need help quitting, ask your doctor.  Keep all follow-up visits as told by your doctor. This is important. Contact a doctor if:  The machine that you were given to use during sleep is uncomfortable or does not seem to be working.  Your symptoms do not get better.  Your symptoms get worse. Get help right away if:  Your chest hurts.  You have trouble breathing in enough  air (shortness of breath).  You have an uncomfortable feeling in your back, arms, or stomach.  You have trouble talking.  One side of your body feels weak.  A part of your face is hanging down (drooping). These symptoms may be an emergency. Do not wait to see if the symptoms will go away. Get medical help right away. Call your local emergency services (911 in the [place] Do not drive yourself to the hospital. This information is not intended to replace advice given to you by  your health care provider. Make sure you discuss any questions you have with your health care provider. Document Released: 10/03/2007 Document Revised: 08/20/2015 Document Reviewed: 10/03/2014 Elsevier Interactive Patient Education  2018 [employer].    CPAP and BiPAP Information CPAP and BiPAP are methods of helping a person breathe with the use of air pressure. CPAP stands for "continuous positive airway pressure." BiPAP stands for "bi-level positive airway pressure." In both methods, air is blown through your nose or mouth and into your air passages to help you breathe well. CPAP and BiPAP use different amounts of pressure to blow air. With CPAP, the amount of pressure stays the same while you breathe in and out. With BiPAP, the amount of pressure is increased when you breathe in (inhale) so that you can take larger breaths. Your health care provider will recommend whether CPAP or BiPAP would be more helpful for you. Why are CPAP and BiPAP treatments used? CPAP or BiPAP can be helpful if you have:  Sleep apnea.  Chronic obstructive pulmonary disease (COPD).  Heart failure.  Medical conditions that weaken the muscles of the chest including muscular dystrophy, or neurological diseases such as amyotrophic lateral sclerosis (ALS).  Other problems that cause breathing to be weak, abnormal, or difficult.  CPAP is most commonly used for obstructive sleep apnea (OSA) to keep the airways from collapsing when the muscles relax during sleep. How is CPAP or BiPAP administered? Both CPAP and BiPAP are provided by a small machine with a flexible plastic tube that attaches to a plastic mask. You wear the mask. Air is blown through the mask into your nose or mouth. The amount of pressure that is used to blow the air can be adjusted on the machine. Your health care provider will determine the pressure setting that should be used based on your individual needs. When should CPAP or BiPAP be used? In most  cases, the mask only needs to be worn during sleep. Generally, the mask needs to be worn throughout the night and during any daytime naps. People with certain medical conditions may also need to wear the mask at other times when they are awake. Follow instructions from your health care provider about when to use the machine. What are some tips for using the mask?  Because the mask needs to be snug, some people feel trapped or closed-in (claustrophobic) when first using the mask. If you feel this way, you may need to get used to the mask. One way to do this is by holding the mask loosely over your nose or mouth and then gradually applying the mask more snugly. You can also gradually increase the amount of time that you use the mask.  Masks are available in various types and sizes. Some fit over your mouth and nose while others fit over just your nose. If your mask does not fit well, talk with your health care provider about getting a different one.  If you are using a  mask that fits over your nose and you tend to breathe through your mouth, a chin strap may be applied to help keep your mouth closed.  The CPAP and BiPAP machines have alarms that may sound if the mask comes off or develops a leak.  If you have trouble with the mask, it is very important that you talk with your health care provider about finding a way to make the mask easier to tolerate. Do not stop using the mask. Stopping the use of the mask could have a negative impact on your health. What are some tips for using the machine?  Place your CPAP or BiPAP machine on a secure table or stand near an electrical outlet.  Know where the on/off switch is located on the machine.  Follow instructions from your health care provider about how to set the pressure on your machine and when you should use it.  Do not eat or drink while the CPAP or BiPAP machine is on. Food or fluids could get pushed into your lungs by the pressure of the CPAP or  BiPAP.  Do not smoke. Tobacco smoke residue can damage the machine.  For home use, CPAP and BiPAP machines can be rented or purchased through home health care companies. Many different brands of machines are available. Renting a machine before purchasing may help you find out which particular machine works well for you.  Keep the CPAP or BiPAP machine and attachments clean. Ask your health care provider for specific instructions. Get help right away if:  You have redness or open areas around your nose or mouth where the mask fits.  You have trouble using the CPAP or BiPAP machine.  You cannot tolerate wearing the CPAP or BiPAP mask.  You have pain, discomfort, and bloating in your abdomen. Summary  CPAP and BiPAP are methods of helping a person breathe with the use of air pressure.  Both CPAP and BiPAP are provided by a small machine with a flexible plastic tube that attaches to a plastic mask.  If you have trouble with the mask, it is very important that you talk with your health care provider about finding a way to make the mask easier to tolerate. This information is not intended to replace advice given to you by your health care provider. Make sure you discuss any questions you have with your health care provider. Document Released: 09/22/2003 Document Revised: 11/13/2015 Document Reviewed: 11/13/2015 Elsevier Interactive Patient Education  2017 [employer].

## 2017-12-03 ENCOUNTER — Encounter (HOSPITAL_COMMUNITY)
Admission: RE | Admit: 2017-12-03 | Discharge: 2017-12-03 | Disposition: A | Discharge status: Home / Self Care | Payer: Medicare Other | Source: Ambulatory Visit | Attending: Hematology & Oncology | Admitting: Hematology & Oncology

## 2017-12-03 DIAGNOSIS — C8589 Other specified types of non-Hodgkin lymphoma, extranodal and solid organ sites: Secondary | ICD-10-CM | POA: Insufficient documentation

## 2017-12-03 LAB — GLUCOSE, CAPILLARY: Glucose-Capillary: 136 mg/dL — ABNORMAL HIGH (ref 70–99)

## 2017-12-03 MED ORDER — FLUDEOXYGLUCOSE F - 18 (FDG) INJECTION
10.7500 | Freq: Once | INTRAVENOUS | Status: AC | PRN
  Administered 2017-12-03: 10.75 via INTRAVENOUS

## 2017-12-07 ENCOUNTER — Encounter

## 2017-12-10 ENCOUNTER — Ambulatory Visit (INDEPENDENT_AMBULATORY_CARE_PROVIDER_SITE_OTHER): Payer: Medicare Other | Admitting: General Practice

## 2017-12-10 DIAGNOSIS — I480 Paroxysmal atrial fibrillation: Secondary | ICD-10-CM

## 2017-12-10 DIAGNOSIS — Z7901 Long term (current) use of anticoagulants: Secondary | ICD-10-CM | POA: Diagnosis not present

## 2017-12-10 LAB — POCT INR: INR: 2.2 (ref 2.0–3.0)

## 2017-12-10 NOTE — Patient Instructions (Signed)
Pre visit review using our clinic review tool, if applicable. No additional management support is needed unless otherwise documented below in the visit note.  Continue to take 7.5 daily except 5 mg on Monday/Wed/Fridays. Dosing instructions given to Olivia Mackie, RN @ [facility] while in patient's home. [phone]  Re-check in 2 weeks.

## 2017-12-10 NOTE — Progress Notes (Signed)
I have reviewed and agree with this plan  

## 2017-12-14 ENCOUNTER — Other Ambulatory Visit: Payer: Self-pay | Admitting: Family Medicine

## 2017-12-16 NOTE — Progress Notes (Signed)
Reviewed and agree with assessment/plan.   Emerita Berkemeier, MD [facility] 01/03/2016, 12:24 PM Pager:  [phone]  

## 2017-12-24 ENCOUNTER — Institutional Professional Consult (permissible substitution): Payer: Self-pay | Admitting: Neurology

## 2017-12-25 ENCOUNTER — Telehealth: Payer: Self-pay | Admitting: *Deleted

## 2017-12-25 ENCOUNTER — Ambulatory Visit (INDEPENDENT_AMBULATORY_CARE_PROVIDER_SITE_OTHER): Payer: Medicare Other | Admitting: General Practice

## 2017-12-25 DIAGNOSIS — Z7901 Long term (current) use of anticoagulants: Secondary | ICD-10-CM

## 2017-12-25 DIAGNOSIS — I480 Paroxysmal atrial fibrillation: Secondary | ICD-10-CM

## 2017-12-25 LAB — POCT INR: INR: 2.2 (ref 2.0–3.0)

## 2017-12-25 MED ORDER — NYSTATIN 100000 UNIT/GM EX POWD: Freq: Four times a day (QID) | CUTANEOUS | 0 refills | Status: DC

## 2017-12-25 NOTE — Telephone Encounter (Signed)
Antony Madura (RN [facility]) is calling because she went to the home of the patient.  He as a yeast infection in his groin.  Antony Madura would like to know if a prescription can be sent to the pharmacy?

## 2017-12-25 NOTE — Telephone Encounter (Signed)
Rx sent 

## 2017-12-25 NOTE — Patient Instructions (Signed)
Pre visit review using our clinic review tool, if applicable. No additional management support is needed unless otherwise documented below in the visit note.  Continue to take 7.5 daily except 5 mg on Monday/Wed/Fridays. Dosing instructions given to Marvin, RN @ [facility] while in patient's home. [phone]  Re-check in 2 weeks.

## 2017-12-26 ENCOUNTER — Other Ambulatory Visit: Payer: Self-pay | Admitting: General Practice

## 2017-12-26 MED ORDER — NYSTATIN-TRIAMCINOLONE 100000-0.1 UNIT/GM-% EX OINT: 1.0000 "application " | TOPICAL_OINTMENT | Freq: Two times a day (BID) | CUTANEOUS | 0 refills | Status: DC

## 2018-01-08 ENCOUNTER — Telehealth: Payer: Self-pay

## 2018-01-08 ENCOUNTER — Ambulatory Visit (INDEPENDENT_AMBULATORY_CARE_PROVIDER_SITE_OTHER): Payer: Medicare Other | Admitting: General Practice

## 2018-01-08 DIAGNOSIS — Z7901 Long term (current) use of anticoagulants: Secondary | ICD-10-CM | POA: Diagnosis not present

## 2018-01-08 DIAGNOSIS — I480 Paroxysmal atrial fibrillation: Secondary | ICD-10-CM

## 2018-01-08 LAB — POCT INR: INR: 3.2 — AB (ref 2.0–3.0)

## 2018-01-08 NOTE — Telephone Encounter (Signed)
Gary Yates with [facility] and gave her the message from Dr. Elease Hashimoto. Gary Yates stated she will let Mr. Lashley know and verbalized an understanding.

## 2018-01-08 NOTE — Telephone Encounter (Signed)
Noted.  Follow up sooner for any fever or other concerns.

## 2018-01-08 NOTE — Telephone Encounter (Signed)
Copied from [place] [phone]. Topic: General - Other >> Jan 08, 2018 10:29 AM Yvette Rack wrote: Reason for CRM: Olivia Mackie with [facility] called in stating she just wanted to report that patient has a cough with some green phelgm. Patient has an appt on 01/12/18 at 11:20 am. Cb# [id number]

## 2018-01-08 NOTE — Patient Instructions (Signed)
Pre visit review using our clinic review tool, if applicable. No additional management support is needed unless otherwise documented below in the visit note.  Skip dosage today and then continue to take 7.5 daily except 5 mg on Monday/Wed/Fridays. Dosing instructions given to Olivia Mackie, RN @ [facility] while in patient's home. [phone]  Re-check in 2 weeks.

## 2018-01-08 NOTE — Telephone Encounter (Signed)
Please see message. °

## 2018-01-12 ENCOUNTER — Ambulatory Visit: Payer: Medicare Other | Admitting: Family Medicine

## 2018-01-14 ENCOUNTER — Ambulatory Visit: Payer: Medicare Other | Admitting: Family Medicine

## 2018-01-16 ENCOUNTER — Encounter: Payer: Self-pay | Admitting: Hematology & Oncology

## 2018-01-16 ENCOUNTER — Telehealth: Payer: Self-pay | Admitting: Hematology & Oncology

## 2018-01-16 ENCOUNTER — Inpatient Hospital Stay: Payer: Medicare Other

## 2018-01-16 ENCOUNTER — Inpatient Hospital Stay: Payer: Medicare Other | Admitting: Hematology & Oncology

## 2018-01-16 ENCOUNTER — Other Ambulatory Visit: Payer: Self-pay

## 2018-01-16 ENCOUNTER — Inpatient Hospital Stay: Payer: Medicare Other | Attending: Hematology & Oncology | Admitting: Hematology & Oncology

## 2018-01-16 VITALS — BP 125/55 | HR 89 | Temp 98.7°F | Resp 18 | Wt 229.0 lb

## 2018-01-16 DIAGNOSIS — D500 Iron deficiency anemia secondary to blood loss (chronic): Secondary | ICD-10-CM | POA: Insufficient documentation

## 2018-01-16 DIAGNOSIS — R5381 Other malaise: Secondary | ICD-10-CM

## 2018-01-16 DIAGNOSIS — Z7984 Long term (current) use of oral hypoglycemic drugs: Secondary | ICD-10-CM | POA: Diagnosis not present

## 2018-01-16 DIAGNOSIS — C8589 Other specified types of non-Hodgkin lymphoma, extranodal and solid organ sites: Secondary | ICD-10-CM | POA: Insufficient documentation

## 2018-01-16 DIAGNOSIS — M549 Dorsalgia, unspecified: Secondary | ICD-10-CM | POA: Diagnosis not present

## 2018-01-16 DIAGNOSIS — N184 Chronic kidney disease, stage 4 (severe): Secondary | ICD-10-CM

## 2018-01-16 DIAGNOSIS — Z79899 Other long term (current) drug therapy: Secondary | ICD-10-CM

## 2018-01-16 DIAGNOSIS — Z923 Personal history of irradiation: Secondary | ICD-10-CM | POA: Insufficient documentation

## 2018-01-16 DIAGNOSIS — Z9225 Personal history of immunosupression therapy: Secondary | ICD-10-CM | POA: Insufficient documentation

## 2018-01-16 DIAGNOSIS — R531 Weakness: Secondary | ICD-10-CM

## 2018-01-16 DIAGNOSIS — Z7901 Long term (current) use of anticoagulants: Secondary | ICD-10-CM

## 2018-01-16 DIAGNOSIS — Z452 Encounter for adjustment and management of vascular access device: Secondary | ICD-10-CM | POA: Diagnosis not present

## 2018-01-16 DIAGNOSIS — R600 Localized edema: Secondary | ICD-10-CM

## 2018-01-16 DIAGNOSIS — D631 Anemia in chronic kidney disease: Secondary | ICD-10-CM

## 2018-01-16 DIAGNOSIS — I480 Paroxysmal atrial fibrillation: Secondary | ICD-10-CM

## 2018-01-16 DIAGNOSIS — R05 Cough: Secondary | ICD-10-CM | POA: Diagnosis not present

## 2018-01-16 DIAGNOSIS — Z95828 Presence of other vascular implants and grafts: Secondary | ICD-10-CM

## 2018-01-16 DIAGNOSIS — Z9221 Personal history of antineoplastic chemotherapy: Secondary | ICD-10-CM | POA: Diagnosis not present

## 2018-01-16 LAB — CMP (CANCER CENTER ONLY)
ALT: 7 U/L (ref 0–44)
AST: 20 U/L (ref 15–41)
Albumin: 3.5 g/dL (ref 3.5–5.0)
Alkaline Phosphatase: 83 U/L (ref 38–126)
Anion gap: 7 (ref 5–15)
BUN: 17 mg/dL (ref 8–23)
CO2: 27 mmol/L (ref 22–32)
Calcium: 8.4 mg/dL — ABNORMAL LOW (ref 8.9–10.3)
Chloride: 102 mmol/L (ref 98–111)
Creatinine: 1.2 mg/dL (ref 0.61–1.24)
GFR, Est AFR Am: >60 mL/min (ref >60)
GFR, Estimated: 56 mL/min — ABNORMAL LOW (ref >60)
Glucose, Bld: 151 mg/dL — ABNORMAL HIGH (ref 70–99)
Potassium: 3.9 mmol/L (ref 3.5–5.1)
Sodium: 136 mmol/L (ref 135–145)
TOTAL PROTEIN: 6.2 g/dL — AB (ref 6.5–8.1)
Total Bilirubin: 0.4 mg/dL (ref 0.3–1.2)

## 2018-01-16 LAB — CBC WITH DIFFERENTIAL (CANCER CENTER ONLY)
Abs Immature Granulocytes: 0.1 10*3/uL — ABNORMAL HIGH (ref 0.00–0.07)
BASOS ABS: 0 10*3/uL (ref 0.0–0.1)
Basophils Relative: 1 %
Eosinophils Absolute: 0.1 10*3/uL (ref 0.0–0.5)
Eosinophils Relative: 2 %
HCT: 28.6 % — ABNORMAL LOW (ref 39.0–52.0)
Hemoglobin: 9.4 g/dL — ABNORMAL LOW (ref 13.0–17.0)
Immature Granulocytes: 1 %
Lymphocytes Relative: 13 %
Lymphs Abs: 1 10*3/uL (ref 0.7–4.0)
MCH: 32.9 pg (ref 26.0–34.0)
MCHC: 32.9 g/dL (ref 30.0–36.0)
MCV: 100 fL (ref 80.0–100.0)
Monocytes Absolute: 0.6 10*3/uL (ref 0.1–1.0)
Monocytes Relative: 8 %
Neutro Abs: 5.8 10*3/uL (ref 1.7–7.7)
Neutrophils Relative %: 75 %
Platelet Count: 125 10*3/uL — ABNORMAL LOW (ref 150–400)
RBC: 2.86 MIL/uL — ABNORMAL LOW (ref 4.22–5.81)
RDW: 14.9 % (ref 11.5–15.5)
WBC Count: 7.6 10*3/uL (ref 4.0–10.5)
nRBC: 0 % (ref 0.0–0.2)

## 2018-01-16 LAB — FERRITIN: Ferritin: 520 ng/mL — ABNORMAL HIGH (ref 24–336)

## 2018-01-16 LAB — IRON AND TIBC
Iron: 29 ug/dL — ABNORMAL LOW (ref 42–163)
Saturation Ratios: 13 % — ABNORMAL LOW (ref 20–55)
TIBC: 219 ug/dL (ref 202–409)
UIBC: 190 ug/dL (ref 117–376)

## 2018-01-16 LAB — LACTATE DEHYDROGENASE: LDH: 260 U/L — ABNORMAL HIGH (ref 98–192)

## 2018-01-16 MED ORDER — DARBEPOETIN ALFA 300 MCG/0.6ML IJ SOSY
300.0000 ug | PREFILLED_SYRINGE | Freq: Once | INTRAMUSCULAR | Status: AC
  Administered 2018-01-16: 300 ug via SUBCUTANEOUS

## 2018-01-16 MED ORDER — HEPARIN SOD (PORK) LOCK FLUSH 100 UNIT/ML IV SOLN
500.0000 [IU] | Freq: Once | INTRAVENOUS | Status: AC
  Administered 2018-01-16: 500 [IU] via INTRAVENOUS
  Filled 2018-01-16: qty 5

## 2018-01-16 MED ORDER — DARBEPOETIN ALFA 300 MCG/0.6ML IJ SOSY
PREFILLED_SYRINGE | INTRAMUSCULAR | Status: AC
  Filled 2018-01-16: qty 0.6

## 2018-01-16 MED ORDER — AZITHROMYCIN 250 MG PO TABS: ORAL_TABLET | ORAL | 0 refills | Status: DC

## 2018-01-16 MED ORDER — SODIUM CHLORIDE 0.9% FLUSH
10.0000 mL | INTRAVENOUS | Status: DC | PRN
  Administered 2018-01-16: 10 mL via INTRAVENOUS
  Filled 2018-01-16: qty 10

## 2018-01-16 NOTE — Patient Instructions (Signed)

## 2018-01-16 NOTE — Patient Instructions (Signed)

## 2018-01-16 NOTE — Progress Notes (Signed)
Hematology and Oncology Follow Up Visit  Gary Yates [id number] 1933-09-23 85 y.o. 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  Unfortunately, he does not feel all that well today.  He has been having a cough for about a week or so.  He has not seen his family doctor.  He said he just did not feel good enough to go see his family doctor.  Yet, he comes to see [place].  He is coughing up greenish mucus.  We will probably have to get him on some antibiotics.  We did do the PET scan on him on 12/03/2017.  Thankfully, the PET scan did not show any problems with recurrent lymphoma.  He has more the way of leg swelling.  He is always had leg swelling.  His hemoglobin is down a little bit today.  He will go ahead and get a dose of Aranesp today.  He has had no rashes.  He has had no vomiting.  He is elevated nausea.  He still has indwelling Foley catheter.  He will not take more than 40 mg Lasix a day.  He says that it dries him out and does not help with his leg swelling.  Overall, his performance status is ECOG 2-3.    Medications:  Current Outpatient Medications:  .  ACCU-CHEK AVIVA PLUS test strip, TEST 3 TIMES A DAY (Patient taking differently: TEST 2 TIMES A DAY), Disp: 300 each, Rfl: 5 .  acetaminophen (TYLENOL) 500 MG tablet, Take 1,000 mg by mouth every 6 (six) hours as needed (for pain/fever/headaches.). , Disp: , Rfl:  .  atorvastatin (LIPITOR) 40 MG tablet, TAKE 1 TABLET BY  MOUTH EVERY DAY, Disp: 90 tablet, Rfl: 0 .  docusate sodium (COLACE) 50 MG capsule, Take 1 capsule (50 mg total) by mouth 2 (two) times daily as needed (for constipation)., Disp: 90 capsule, Rfl: 0 .  furosemide (LASIX) 40 MG tablet, Take one tablet by mouth every other day., Disp: 30 tablet, Rfl: 0 .  gabapentin (NEURONTIN) 300 MG capsule, Take 900 mg by mouth 2 (two) times daily. , Disp: , Rfl:  .  HYDROcodone-acetaminophen (NORCO) 10-325 MG tablet, Take 1-2 tablets by mouth every 4 (four) hours as needed for moderate pain. Maximum dose per 24 hours - 8 pills (Patient not taking: Reported on 12/02/2017), Disp: 20 tablet, Rfl: 0 .  KLOR-CON M20 20 MEQ tablet, TAKE 1 TABLET BY MOUTH EVERY DAY, Disp: 90 tablet, Rfl: 0 .  magic mouthwash w/lidocaine SOLN, Take 5 mLs by mouth 4 (four) times daily as needed for mouth pain. 1:1 ratio. DIPHENHYDRAMINE HCL (ANTIHISTAMINES - ETHANOLAMINES),ALUM & MAG HYDROXIDE-SIMETH,NYSTATIN (ANTI-INFECTIVES - THROAT),LIDOCAINE HCL (ANESTHETICS TOPICAL ORAL), Disp: 600 mL, Rfl: 3 .  Melatonin 1 MG TABS, Take 1 tablet by mouth at bedtime. , Disp: , Rfl:  .  metFORMIN (GLUCOPHAGE) 500 MG tablet, TAKE 1 TABLET  BY MOUTH TWICE A DAY WITH A MEAL, Disp: 180 tablet, Rfl: 2 .  metoprolol succinate (TOPROL XL) 25 MG 24 hr tablet, Take 1 tablet (25 mg total) by mouth daily., Disp: 30 tablet, Rfl: 11 .  Multiple Vitamins-Minerals (MULTIVITAMIN WITH MINERALS) tablet, Take 1 tablet by mouth daily., Disp: , Rfl:  .  nitroGLYCERIN (NITROSTAT) 0.4 MG SL tablet, Place 1 tablet (0.4 mg total) under the tongue every 5 (five) minutes as needed. Chest pain, Disp: 25 tablet, Rfl: 6 .  nystatin (MYCOSTATIN/NYSTOP) powder, Apply topically 4 (four) times daily., Disp: 60 g, Rfl: 0 .  nystatin-triamcinolone ointment (MYCOLOG), Apply 1 application topically 2 (two) times daily., Disp: 15 g, Rfl: 0 .  pantoprazole (PROTONIX) 40 MG tablet, TAKE 1 TABLET BY MOUTH EVERY DAY (Patient not taking: Reported on  12/02/2017), Disp: 90 tablet, Rfl: 3 .  polyethylene glycol (MIRALAX / GLYCOLAX) packet, Take 17 g by mouth daily., Disp: 14 each, Rfl: 0 .  pramipexole (MIRAPEX) 1.5 MG tablet, Take 1 tablet (1.5 mg total) by mouth 2 (two) times daily., Disp: 60 tablet, Rfl: 5 .  TOUJEO SOLOSTAR 300 UNIT/ML SOPN, INJECT 20 UNITS INTO THE SKIN DAILY AFTER BREAKFAST. (Patient taking differently: Inject 15 Units into the skin daily after breakfast. ), Disp: 15 mL, Rfl: 1 .  vitamin B-12 (CYANOCOBALAMIN) 1000 MCG tablet, Take 1 tablet (1,000 mcg total) by mouth daily., Disp: 30 tablet, Rfl: 0 .  warfarin (COUMADIN) 5 MG tablet, Take as directed by anticoagulation clinic (Patient taking differently: Take 5-7.5 mg by mouth as directed. 5 mg Monday, Wed, and Fri, and 7.5 mg  All other days), Disp: 45 tablet, Rfl: 3 No current facility-administered medications for this visit.   Facility-Administered Medications Ordered in Other Visits:  .  acetaminophen (TYLENOL) tablet 650 mg, 650 mg, Oral, Once, [place], Sarah M, NP .  pegfilgrastim (NEULASTA ONPRO KIT) injection 6 mg, 6 mg, Subcutaneous, Once, [place], Peter R, MD .  sodium chloride flush (NS) 0.9 % injection 10 mL, 10 mL, Intracatheter, PRN, Volanda Napoleon, MD, 10 mL at 02/27/17 1503 .  sodium chloride flush (NS) 0.9 % injection 10 mL, 10 mL, Intravenous, PRN, Volanda Napoleon, MD, 10 mL at 05/07/17 3646 .  sodium chloride flush (NS) 0.9 % injection 10 mL, 10 mL, Intravenous, PRN, Volanda Napoleon, MD, 10 mL at 01/16/18 0820  Allergies:  Allergies  Allergen Reactions  . Ace Inhibitors Other (See Comments)    cough  . Codeine Nausea Only and Rash       . Penicillins Rash    Childhood allergy Has patient had a PCN reaction causing immediate rash, facial/tongue/throat swelling, SOB or lightheadedness with hypotension: Yes Has patient had a PCN reaction causing severe rash involving mucus membranes or skin necrosis: Yes Has patient had a PCN reaction that  required hospitalization No Has patient had a PCN reaction occurring within the last 10 years: No If all of the above answers are "NO", then may proceed with Cephalosporin use.     Past Medical History, Surgical history, Social history, and Family History were reviewed and updated.  Review of Systems: Review of Systems  HENT: Positive for sinus pain and sore throat.   Eyes: Positive for blurred vision.  Respiratory: Positive for wheezing.   Cardiovascular: Positive for leg swelling.  Gastrointestinal: Negative.   Genitourinary: Negative.   Musculoskeletal: Positive for back pain.  Skin: Negative.   Neurological: Positive for weakness.  Endo/Heme/Allergies: Negative.   Psychiatric/Behavioral: Negative.  Physical Exam:  weight is 229 lb (103.9 kg). His oral temperature is 98.7 F (37.1 C). His blood pressure is 125/55 (abnormal) and his pulse is 89. His respiration is 18 and oxygen saturation is 98%.   Wt Readings from Last 3 Encounters:  01/16/18 229 lb (103.9 kg)  12/02/17 218 lb 3.2 oz (99 kg)  11/14/17 214 lb 4 oz (97.2 kg)      Physical Exam Vitals signs reviewed.  HENT:     Head: Normocephalic and atraumatic.  Eyes:     Pupils: Pupils are equal, round, and reactive to light.  Neck:     Musculoskeletal: Normal range of motion.  Cardiovascular:     Rate and Rhythm: Normal rate and regular rhythm.     Heart sounds: Normal heart sounds.  Pulmonary:     Effort: Pulmonary effort is normal.     Breath sounds: Normal breath sounds.  Abdominal:     General: Bowel sounds are normal.     Palpations: Abdomen is soft.  Musculoskeletal: Normal range of motion.        General: No tenderness or deformity.  Lymphadenopathy:     Cervical: No cervical adenopathy.  Skin:    General: Skin is warm and dry.     Findings: No erythema or rash.  Neurological:     Mental Status: He is alert and oriented to person, place, and time.  Psychiatric:        Behavior: Behavior  normal.        Thought Content: Thought content normal.        Judgment: Judgment normal.      Lab Results  Component Value Date   WBC 7.6 01/16/2018   HGB 9.4 (L) 01/16/2018   HCT 28.6 (L) 01/16/2018   MCV 100.0 01/16/2018   PLT 125 (L) 01/16/2018     Chemistry      Component Value Date/Time   NA 142 11/14/2017 0810   NA 141 01/10/2017 1136   NA 137 12/14/2015 1100   K 4.2 11/14/2017 0810   K 4.4 01/10/2017 1136   K 3.7 12/14/2015 1100   CL 106 11/14/2017 0810   CL 104 01/10/2017 1136   CO2 25 11/14/2017 0810   CO2 27 01/10/2017 1136   CO2 24 12/14/2015 1100   BUN 15 11/14/2017 0810   BUN 19 01/10/2017 1136   BUN 18.4 12/14/2015 1100   CREATININE 0.90 11/14/2017 0810   CREATININE 1.1 01/10/2017 1136   CREATININE 0.9 12/14/2015 1100      Component Value Date/Time   CALCIUM 9.3 11/14/2017 0810   CALCIUM 9.4 01/10/2017 1136   CALCIUM 9.5 12/14/2015 1100   ALKPHOS 79 11/14/2017 0810   ALKPHOS 112 (H) 01/10/2017 1136   ALKPHOS 102 12/14/2015 1100   AST 27 11/14/2017 0810   AST 22 12/14/2015 1100   ALT 12 11/14/2017 0810   ALT 18 01/10/2017 1136   ALT 10 12/14/2015 1100   BILITOT 0.6 11/14/2017 0810   BILITOT 0.58 12/14/2015 1100      Impression and Plan: Mr. Tutterow is an 85 year old white male. He has relapsed large cell non-Hodgkin lymphoma of the testicle. He was on salvage chemotherapy with Rituxan/bendamustine/Velcade.  He did not really respond well to this.  We then treated him with dose reduced R-ICE.  He responded very well to this.  His last PET scan showed that he was in remission.    I will see about sending a Z-Pak in for him.  Maybe  this will help with a bronchitis that he might have.  I do not think he needs a chest x-ray.  We will see about given a dose of Aranesp today.  Again his hemoglobin is below 10.  He has a low erythropoietin level.  Hopefully, he will see his other doctors who probably can help him more than we can right now.  We will  plan to get him back in 2 more months.  He does have the Port-A-Cath so we had to keep this flushed.   Volanda Napoleon, MD 1/10/20208:31 AM

## 2018-01-16 NOTE — Progress Notes (Signed)
[employer] [place] per Otilio Carpen.  Okay to treat.

## 2018-01-16 NOTE — Telephone Encounter (Signed)
Appts scheduled patient notified per 1/10 staff message

## 2018-01-19 ENCOUNTER — Ambulatory Visit (INDEPENDENT_AMBULATORY_CARE_PROVIDER_SITE_OTHER): Payer: Medicare Other | Admitting: General Practice

## 2018-01-19 DIAGNOSIS — Z7901 Long term (current) use of anticoagulants: Secondary | ICD-10-CM | POA: Diagnosis not present

## 2018-01-19 DIAGNOSIS — I480 Paroxysmal atrial fibrillation: Secondary | ICD-10-CM

## 2018-01-19 LAB — POCT INR: INR: 2.3 (ref 2.0–3.0)

## 2018-01-19 NOTE — Patient Instructions (Addendum)
Pre visit review using our clinic review tool, if applicable. No additional management support is needed unless otherwise documented below in the visit note.  Continue to take 7.5 daily except 5 mg on Monday/Wed/Fridays. Dosing instructions given to Estill Bamberg, RN @ [facility] while in patient's home. [phone]  Re-check in 2 weeks.

## 2018-01-21 ENCOUNTER — Ambulatory Visit: Payer: Self-pay | Admitting: *Deleted

## 2018-01-21 NOTE — Telephone Encounter (Signed)
Contacted pt regarding his symptoms; he states that he has swelling in his legs and feet; the pt says that he has been taking furosemide 40 mg last dose 01/21/2018; the pt says that he 229 lbs at cardiologist; he weighed 210 lbs 01/20/2018 (no clothes);  recommendations made per nurse triage protocol; the pt is unable to accept an appointment with Dr Elease Hashimoto on 01/21/2018; he requests to be seen 01/23/2018; pt offered and accepted appointment with Dr Carolann Littler, [facility], 01/23/2018 at 1100; he verbalized understanding; will route to office for notification of this upcoming appointment.        Reason for Disposition . [1] MODERATE leg swelling (e.g., swelling extends up to knees) AND [2] new onset or worsening  Answer Assessment - Initial Assessment Questions 1. ONSET: "When did the swelling start?" (e.g., minutes, hours, days)     01/14/2018 2. LOCATION: "What part of the leg is swollen?"  "Are both legs swollen or just one leg?"     Both legs knee to feet 3. SEVERITY: "How bad is the swelling?" (e.g., localized; mild, moderate, severe)  - Localized - small area of swelling localized to one leg  - MILD pedal edema - swelling limited to foot and ankle, pitting edema < 1/4 inch (6 mm) deep, rest and elevation eliminate most or all swelling  - MODERATE edema - swelling of lower leg to knee, pitting edema > 1/4 inch (6 mm) deep, rest and elevation only partially reduce swelling  - SEVERE edema - swelling extends above knee, facial or hand swelling present      moderate 4. REDNESS: "Does the swelling look red or infected?"     no 5. PAIN: "Is the swelling painful to touch?" If so, ask: "How painful is it?"   (Scale 1-10; mild, moderate or severe)     no 6. FEVER: "Do you have a fever?" If so, ask: "What is it, how was it measured, and when did it start?"      no 7. CAUSE: "What do you think is causing the leg swelling?"     Medication may need to be changed 8. MEDICAL HISTORY: "Do you have a  history of heart failure, kidney disease, liver failure, or cancer?"     Afib, htn, svt, non-hodgkin's lymphoma  9. RECURRENT SYMPTOM: "Have you had leg swelling before?" If so, ask: "When was the last time?" "What happened that time?"     Yes takes furosemide 10. OTHER SYMPTOMS: "Do you have any other symptoms?" (e.g., chest pain, difficulty breathing)       no 11. PREGNANCY: "Is there any chance you are pregnant?" "When was your last menstrual period?"       n/a  Protocols used: LEG SWELLING AND EDEMA-A-AH

## 2018-01-23 ENCOUNTER — Ambulatory Visit: Payer: Medicare Other | Admitting: Family Medicine

## 2018-01-23 ENCOUNTER — Other Ambulatory Visit: Payer: Self-pay

## 2018-01-23 ENCOUNTER — Encounter: Payer: Self-pay | Admitting: Family Medicine

## 2018-01-23 ENCOUNTER — Inpatient Hospital Stay: Payer: Medicare Other

## 2018-01-23 VITALS — BP 110/48 | HR 80 | Temp 97.8°F | Resp 20

## 2018-01-23 VITALS — BP 108/58 | HR 88 | Temp 97.6°F | Ht 70.0 in | Wt 223.7 lb

## 2018-01-23 DIAGNOSIS — E1142 Type 2 diabetes mellitus with diabetic polyneuropathy: Secondary | ICD-10-CM | POA: Diagnosis not present

## 2018-01-23 DIAGNOSIS — E1140 Type 2 diabetes mellitus with diabetic neuropathy, unspecified: Secondary | ICD-10-CM

## 2018-01-23 DIAGNOSIS — I10 Essential (primary) hypertension: Secondary | ICD-10-CM

## 2018-01-23 DIAGNOSIS — E1165 Type 2 diabetes mellitus with hyperglycemia: Secondary | ICD-10-CM

## 2018-01-23 DIAGNOSIS — R600 Localized edema: Secondary | ICD-10-CM

## 2018-01-23 DIAGNOSIS — D631 Anemia in chronic kidney disease: Secondary | ICD-10-CM

## 2018-01-23 DIAGNOSIS — D500 Iron deficiency anemia secondary to blood loss (chronic): Secondary | ICD-10-CM

## 2018-01-23 DIAGNOSIS — C8589 Other specified types of non-Hodgkin lymphoma, extranodal and solid organ sites: Secondary | ICD-10-CM | POA: Diagnosis not present

## 2018-01-23 DIAGNOSIS — N184 Chronic kidney disease, stage 4 (severe): Principal | ICD-10-CM

## 2018-01-23 LAB — BASIC METABOLIC PANEL
BUN: 18 mg/dL (ref 6–23)
CO2: 23 mEq/L (ref 19–32)
CREATININE: 1.25 mg/dL (ref 0.40–1.50)
Calcium: 9.1 mg/dL (ref 8.4–10.5)
Chloride: 105 mEq/L (ref 96–112)
GFR: 55.16 mL/min — ABNORMAL LOW (ref >60.00)
Glucose, Bld: 124 mg/dL — ABNORMAL HIGH (ref 70–99)
Potassium: 4.2 mEq/L (ref 3.5–5.1)
Sodium: 141 mEq/L (ref 135–145)

## 2018-01-23 LAB — HEMOGLOBIN A1C: Hgb A1c MFr Bld: 6.4 % (ref 4.6–6.5)

## 2018-01-23 LAB — TSH: TSH: 5.3 u[IU]/mL — ABNORMAL HIGH (ref 0.35–4.50)

## 2018-01-23 MED ORDER — SODIUM CHLORIDE 0.9% FLUSH
10.0000 mL | Freq: Once | INTRAVENOUS | Status: AC | PRN
  Administered 2018-01-23: 10 mL
  Filled 2018-01-23: qty 10

## 2018-01-23 MED ORDER — SODIUM CHLORIDE 0.9 % IV SOLN
Freq: Once | INTRAVENOUS | Status: AC
  Administered 2018-01-23: 14:00:00 via INTRAVENOUS
  Filled 2018-01-23: qty 250

## 2018-01-23 MED ORDER — HEPARIN SOD (PORK) LOCK FLUSH 100 UNIT/ML IV SOLN
500.0000 [IU] | Freq: Once | INTRAVENOUS | Status: AC | PRN
  Administered 2018-01-23: 500 [IU]
  Filled 2018-01-23: qty 5

## 2018-01-23 MED ORDER — SODIUM CHLORIDE 0.9 % IV SOLN
510.0000 mg | Freq: Once | INTRAVENOUS | Status: AC
  Administered 2018-01-23: 510 mg via INTRAVENOUS
  Filled 2018-01-23: qty 17

## 2018-01-23 NOTE — Patient Instructions (Signed)

## 2018-01-23 NOTE — Progress Notes (Signed)
Subjective:     Patient ID: Gary Elting., male   DOB: 1933-01-26, 85 y.o.   MRN: [id number]  HPI Patient is seen with chief complaint of increasing bilateral leg edema past couple weeks.  He has history of some chronic intermittent edema in the past.  He does wear support hose fairly consistently and tries to elevate legs frequently during the day.  His weight is up almost 9 pounds from last September here.  He had albumin a week ago 3.5 with stable creatinine 1.2.  Echocardiogram June 2019 ejection fraction 55 to 60% with grade 1 diastolic dysfunction.  Takes Lasix 40 mg daily.  He is on fairly high-dose gabapentin for his peripheral neuropathy symptoms.  No recent orthopnea.  Multiple chronic problems including history of obesity, atrial fibrillation, type 2 diabetes, hypertension, CAD, COPD, obstructive sleep apnea, diabetic peripheral neuropathy, history of diffuse non-Hodgkin's lymphoma of the testis, chronic kidney disease, restless leg syndrome  He has type 2 diabetes.  Last A1c was 5.6% back in August.  He does have history of poorly controlled diabetes but after losing weight with his cancer diagnosis his blood sugars have been much improved  Past Medical History:  Diagnosis Date  . Anemia in chronic renal disease 05/07/2017  . Anxiety   . Atrial fibrillation ([place])   . COPD (chronic obstructive pulmonary disease) ([place])    pt. denies  . Coronary artery disease    a. h/o Overlapping stents RCA;  b. 06/2011 Cath: patent stents, nonobs dzs, NL EF.  . Diabetic peripheral neuropathy ([place])   . Diffuse non-Hodgkin's lymphoma of testis ([place]) 09/28/2015  . DM (diabetes mellitus) ([place])    Type 2, peripheral neuropathy.  . Dyspnea    with exertion  . Dysrhythmia   . GERD (gastroesophageal reflux disease)   . Headache   . History of bronchitis   . History of kidney stones   . History of radiation therapy 02/19/16 - 03/13/16   Testis/Scrotum: 32.4 Gy in 18 fractions  . History of radiation  therapy 08/07/16-08/20/16   left adrenal gland mass treated to 30 Gy in 10 fractions  . Hyperlipidemia   . Hypertension   . Iron deficiency anemia due to chronic blood loss 08/08/2017  . Low testosterone   . Nephrolithiasis   . OSA (obstructive sleep apnea) 11/26/2017  . Osteoarthritis    shoulder  . Restless leg   . SVT (supraventricular tachycardia) ([place])   . Urinary frequency   . Wears partial dentures    upper and lower   Past Surgical History:  Procedure Laterality Date  . APPENDECTOMY    . [place]  . CARDIAC CATHETERIZATION  01/2013  . CATARACT EXTRACTION, BILATERAL    . CHOLECYSTECTOMY    . COLONOSCOPY    . CORONARY ANGIOPLASTY  2004  . CYSTOSCOPY N/A 08/18/2017   Procedure: CYSTOSCOPY WITH FULGURATION AND SUPRA PUBIC TUBE PLACEMENT;  Surgeon: Kathie Rhodes, MD;  Location: [facility];  Service: Urology;  Laterality: N/A;  . EYE SURGERY Bilateral    cataracts  . IR GENERIC HISTORICAL  10/05/2015   IR US GUIDE VASC ACCESS RIGHT 10/05/2015 Marybelle Killings, MD [facility]  . IR GENERIC HISTORICAL  10/05/2015   IR FLUORO GUIDE PORT INSERTION RIGHT 10/05/2015 Marybelle Killings, MD [facility]  . LEFT HEART CATHETERIZATION WITH CORONARY ANGIOGRAM N/A 06/18/2011   Procedure: LEFT HEART CATHETERIZATION WITH CORONARY ANGIOGRAM;  Surgeon: Peter M Martinique, MD;  Location: [facility] CATH LAB;  Service:  Cardiovascular;  Laterality: N/A;  . LEFT HEART CATHETERIZATION WITH CORONARY ANGIOGRAM N/A 01/27/2013   Procedure: LEFT HEART CATHETERIZATION WITH CORONARY ANGIOGRAM;  Surgeon: Burnell Blanks, MD;  Location: [facility] C[facility] LAB;  Service: Cardiovascular;  Laterality: N/A;  . LUMBAR LAMINECTOMY/DECOMPRESSION MICRODISCECTOMY N/A 02/07/2015   Procedure: Lumbar three-Sacral one Decompression;  Surgeon: Kevan Ny Ditty, MD;  Location: [facility]URO ORS;  Service: Neurosurgery;  Laterality: N/A;  L3 to S1 Decompression  . MULTIPLE TOOTH EXTRACTIONS    . ORCHIECTOMY Right 09/01/2015   Procedure: RIGHT  ORCHIECTOMY;  Surgeon: Kathie Rhodes, MD;  Location: [facility];  Service: Urology;  Laterality: Right;  . port a cath in place     . ROTATOR CUFF REPAIR Left     reports that he quit smoking about 39 years ago. His smoking use included cigarettes. He has a 30.00 pack-year smoking history. He has never used smokeless tobacco. He reports that he does not drink alcohol or use drugs. family history includes Alzheimer's disease in his mother; Arthritis in his brother and sister; Coronary artery disease in some other family members; Heart disease in his brother, father, mother, and sister; Migraines in his daughter and father; Obesity in his sister and son; Prostate cancer in his brother; Sleep apnea in his son; Thyroid disease in his daughter; Ulcers in his father. Allergies  Allergen Reactions  . Ace Inhibitors Other (See Comments)    cough  . Codeine Nausea Only and Rash       . Penicillins Rash    Childhood allergy Has patient had a PCN reaction causing immediate rash, facial/tongue/throat swelling, SOB or lightheadedness with hypotension: Yes Has patient had a PCN reaction causing severe rash involving mucus membranes or skin necrosis: Yes Has patient had a PCN reaction that required hospitalization No Has patient had a PCN reaction occurring within the last 10 years: No If all of the above answers are "NO", then may proceed with Cephalosporin use.       Review of Systems  Constitutional: Positive for fatigue. Negative for appetite change, fever and unexpected weight change.  Respiratory: Negative for cough.   Cardiovascular: Positive for leg swelling. Negative for chest pain and palpitations.  Gastrointestinal: Negative for abdominal pain.  Neurological: Positive for light-headedness. Negative for syncope.  Psychiatric/Behavioral: Negative for confusion.       Objective:   Physical Exam Constitutional:      Appearance: Normal appearance.  Cardiovascular:     Rate and Rhythm:  Normal rate.  Pulmonary:     Effort: Pulmonary effort is normal.     Breath sounds: Normal breath sounds.  Musculoskeletal:     Comments: He has support stockings on bilaterally.  He does have trace pitting edema lower legs bilaterally  Neurological:     General: No focal deficit present.     Mental Status: He is alert and oriented to person, place, and time.        Assessment:     #1 bilateral leg edema.  Suspect multifactorial and related to venous stasis, diastolic dysfunction, gabapentin, sedentary lifestyle.  No history of systolic dysfunction by echo last summer  #2 type 2 diabetes with history of recent good control  #3 history of diabetic peripheral neuropathy on chronic gabapentin    Plan:     -Check labs with basic metabolic panel, TSH, E2A -Elevate legs frequently -Continue support stockings daily -Increase Lasix to 40 mg twice daily for 5 days and drop back to once daily -Watch sodium intake -We  discussed that medications such as gabapentin could contribute to his edema but he has had fairly severe neuropathy pains in the past without this.  He has been on other medications such as Cymbalta without very good control.  Would not use tricyclic's because of increased risk at his age  [name] MD [facility]

## 2018-01-23 NOTE — Patient Instructions (Signed)
Increase the Lasix to one twice daily for 5 days and then drop back to once daily  Elevate legs frequently.

## 2018-01-23 NOTE — Telephone Encounter (Signed)
FYI, patient is coming in today at 11am.

## 2018-01-28 ENCOUNTER — Telehealth: Payer: Self-pay | Admitting: Family Medicine

## 2018-01-28 ENCOUNTER — Other Ambulatory Visit: Payer: Self-pay

## 2018-01-28 DIAGNOSIS — R7989 Other specified abnormal findings of blood chemistry: Secondary | ICD-10-CM

## 2018-01-28 NOTE — Telephone Encounter (Signed)
Pt returned phone call and lab message given to him. Pt voiced understanding.  He also scheduled a 3 month follow up as ordered.  He stated that he has been taking the Lasix as prescribed (one  tab twice a day for 5 days and then will drop back to once daily ). He has one more day.  It is kind of hard to keep his legs elevated but he is trying. He is getting off a lot of fluid.

## 2018-01-30 NOTE — Telephone Encounter (Signed)
Please see message. °

## 2018-02-01 NOTE — Telephone Encounter (Signed)
Noted  

## 2018-02-02 ENCOUNTER — Ambulatory Visit: Payer: Medicare Other | Admitting: Pulmonary Disease

## 2018-02-03 ENCOUNTER — Ambulatory Visit: Payer: Self-pay | Admitting: *Deleted

## 2018-02-03 ENCOUNTER — Telehealth: Payer: Self-pay | Admitting: Family Medicine

## 2018-02-03 ENCOUNTER — Ambulatory Visit: Payer: Self-pay

## 2018-02-03 NOTE — Telephone Encounter (Signed)
Pt aware that we do not have any samples at this time.  Pt will check back in at a later date.  Nothing further needed.

## 2018-02-03 NOTE — Telephone Encounter (Signed)
Called patient and gave him the message from Dr. Elease Hashimoto and patient stated that he is taking the Lasix once daily.  Patient verbalized an understanding.

## 2018-02-03 NOTE — Telephone Encounter (Signed)
Please advise 

## 2018-02-03 NOTE — Telephone Encounter (Signed)
Olivia Mackie, nurse with [facility], giving INR results :3.2.

## 2018-02-03 NOTE — Telephone Encounter (Addendum)
Message from Adelene Idler sent at 02/03/2018 2:01 PM EST   Patient called in and stated he was told by his home health nurse that he needed to call his doctor due to his bp runing at 101/58   Pt called because home health nurse stated his b/p was 101/58 when she checked it.  He denies feeling weak. States feels pretty good right now. No dizziness, no vision problems. He had been on lasix twice a day for the last 5 days and finished up last night. Now back to taking the medication once a day.  He is checking his b/p now  102/75 HR 60 and then 112/60 HR 75. Pt advised of when to call the provider for his b/p example 100 or less over 50's. And if he is feeling dizzy or weak. Advised to drink plenty of fluids to increase his b/p.   Pt voiced understanding. Routing to flow at [facility]  Reason for Disposition . Health Information question, no triage required and triager able to answer question  Answer Assessment - Initial Assessment Questions 1. REASON FOR CALL or QUESTION: "What is your reason for calling today?" or "How can I best help you?" or "What question do you have that I can help answer?"     To make the provider aware of his b/p reading this morning.  Protocols used: INFORMATION ONLY CALL-A-AH

## 2018-02-03 NOTE — Telephone Encounter (Signed)
Follow BP for now.  No further interventions at this time.  Go back to once daily Lasix.

## 2018-02-03 NOTE — Telephone Encounter (Signed)
Please see message. °

## 2018-02-03 NOTE — Telephone Encounter (Signed)
Returned call to patient. He is checking to see if there is a Toujeo Pen there for him to pick up. Please give him a call.

## 2018-02-04 ENCOUNTER — Ambulatory Visit (INDEPENDENT_AMBULATORY_CARE_PROVIDER_SITE_OTHER): Payer: Medicare Other | Admitting: General Practice

## 2018-02-04 DIAGNOSIS — I480 Paroxysmal atrial fibrillation: Secondary | ICD-10-CM

## 2018-02-04 DIAGNOSIS — Z7901 Long term (current) use of anticoagulants: Secondary | ICD-10-CM

## 2018-02-04 LAB — POCT INR: INR: 3.2 — AB (ref 2.0–3.0)

## 2018-02-04 NOTE — Patient Instructions (Addendum)
Pre visit review using our clinic review tool, if applicable. No additional management support is needed unless otherwise documented below in the visit note.  Hold dosage today and then continue to take 7.5 daily except 5 mg on Monday/Wed/Fridays. Dosing instructions given to Gary Mackie, RN @ [facility] home [facility] while in patient's home.   Re-check in 2 weeks. [phone]

## 2018-02-04 NOTE — Progress Notes (Signed)
I have reviewed and agree with note, evaluation, plan.   Arnola Crittendon, MD  

## 2018-02-07 ENCOUNTER — Other Ambulatory Visit: Payer: Self-pay | Admitting: Family Medicine

## 2018-02-10 ENCOUNTER — Telehealth: Payer: Self-pay | Admitting: Hematology & Oncology

## 2018-02-10 ENCOUNTER — Telehealth: Payer: Self-pay | Admitting: *Deleted

## 2018-02-10 NOTE — Telephone Encounter (Signed)
Received voice mail message from wife stating,"I need to change the appointments on 04/07/2018. Return number is [phone]."

## 2018-02-10 NOTE — Telephone Encounter (Signed)
LMVM for patients wife regarding his appointments for 3/31 per 2/4 staff message

## 2018-02-11 ENCOUNTER — Other Ambulatory Visit: Payer: Self-pay | Admitting: Family Medicine

## 2018-02-16 ENCOUNTER — Other Ambulatory Visit: Payer: Self-pay | Admitting: Family Medicine

## 2018-02-16 ENCOUNTER — Other Ambulatory Visit: Payer: Self-pay | Admitting: Internal Medicine

## 2018-02-16 NOTE — Telephone Encounter (Signed)
Refill OK

## 2018-02-16 NOTE — Telephone Encounter (Signed)
Yes  Refill for one year.

## 2018-02-16 NOTE — Telephone Encounter (Signed)
Is this dosage correct? Last filled by historical provider

## 2018-02-17 NOTE — Telephone Encounter (Signed)
Rx done. 

## 2018-02-19 ENCOUNTER — Ambulatory Visit (INDEPENDENT_AMBULATORY_CARE_PROVIDER_SITE_OTHER): Payer: Medicare Other | Admitting: General Practice

## 2018-02-19 DIAGNOSIS — Z7901 Long term (current) use of anticoagulants: Secondary | ICD-10-CM | POA: Diagnosis not present

## 2018-02-19 DIAGNOSIS — I480 Paroxysmal atrial fibrillation: Secondary | ICD-10-CM

## 2018-02-19 LAB — POCT INR: INR: 2 (ref 2.0–3.0)

## 2018-02-19 NOTE — Patient Instructions (Signed)
Pre visit review using our clinic review tool, if applicable. No additional management support is needed unless otherwise documented below in the visit note.  Continue to take 7.5 daily except 5 mg on Monday/Wed/Fridays. Dosing instructions given to Olivia Mackie, RN @ [facility] [phone]  home health while in patient's home.   Re-check in 2 weeks.

## 2018-03-04 ENCOUNTER — Other Ambulatory Visit: Payer: Self-pay | Admitting: Family Medicine

## 2018-03-05 ENCOUNTER — Ambulatory Visit (INDEPENDENT_AMBULATORY_CARE_PROVIDER_SITE_OTHER): Payer: Medicare Other | Admitting: General Practice

## 2018-03-05 DIAGNOSIS — I480 Paroxysmal atrial fibrillation: Secondary | ICD-10-CM

## 2018-03-05 DIAGNOSIS — Z7901 Long term (current) use of anticoagulants: Secondary | ICD-10-CM

## 2018-03-05 LAB — POCT INR: INR: 2.2 (ref 2.0–3.0)

## 2018-03-05 NOTE — Patient Instructions (Signed)
Pre visit review using our clinic review tool, if applicable. No additional management support is needed unless otherwise documented below in the visit note.  Continue to take 7.5 daily except 5 mg on Monday/Wed/Fridays. Dosing instructions given to Olivia Mackie, RN @ [facility] [phone]  home health while in patient's home.   Re-check in 2 weeks.

## 2018-03-08 ENCOUNTER — Other Ambulatory Visit: Payer: Self-pay | Admitting: Family Medicine

## 2018-03-10 ENCOUNTER — Other Ambulatory Visit: Payer: Self-pay | Admitting: Family Medicine

## 2018-03-16 ENCOUNTER — Other Ambulatory Visit: Payer: Medicare Other

## 2018-03-16 ENCOUNTER — Ambulatory Visit: Payer: Medicare Other | Admitting: Hematology & Oncology

## 2018-03-16 ENCOUNTER — Other Ambulatory Visit: Payer: Self-pay | Admitting: Family Medicine

## 2018-03-16 ENCOUNTER — Ambulatory Visit: Payer: Medicare Other

## 2018-03-18 ENCOUNTER — Ambulatory Visit (INDEPENDENT_AMBULATORY_CARE_PROVIDER_SITE_OTHER): Payer: Medicare Other | Admitting: General Practice

## 2018-03-18 DIAGNOSIS — Z7901 Long term (current) use of anticoagulants: Secondary | ICD-10-CM

## 2018-03-18 DIAGNOSIS — I480 Paroxysmal atrial fibrillation: Secondary | ICD-10-CM

## 2018-03-18 LAB — POCT INR: INR: 2.4 (ref 2.0–3.0)

## 2018-03-18 NOTE — Patient Instructions (Signed)
Pre visit review using our clinic review tool, if applicable. No additional management support is needed unless otherwise documented below in the visit note.  Continue to take 7.5 daily except 5 mg on Monday/Wed/Fridays. Dosing instructions given to [place], RN @ [facility] [phone]. home health while in patient's home.   Re-check in 2 weeks.

## 2018-03-18 NOTE — Progress Notes (Signed)
I have reviewed and agree with note, evaluation, plan.   Persephanie Laatsch, MD  

## 2018-03-23 ENCOUNTER — Encounter: Payer: Self-pay | Admitting: Family Medicine

## 2018-03-23 ENCOUNTER — Ambulatory Visit (INDEPENDENT_AMBULATORY_CARE_PROVIDER_SITE_OTHER): Payer: Medicare Other | Admitting: Family Medicine

## 2018-03-23 ENCOUNTER — Telehealth: Payer: Self-pay | Admitting: Family Medicine

## 2018-03-23 ENCOUNTER — Other Ambulatory Visit: Payer: Self-pay

## 2018-03-23 VITALS — BP 120/64 | HR 80 | Temp 97.9°F | Wt 228.6 lb

## 2018-03-23 DIAGNOSIS — R059 Cough, unspecified: Secondary | ICD-10-CM

## 2018-03-23 DIAGNOSIS — R05 Cough: Secondary | ICD-10-CM

## 2018-03-23 MED ORDER — HYDROCOD POLST-CPM POLST ER 10-8 MG/5ML PO SUER: 5.0000 mL | Freq: Two times a day (BID) | ORAL | 0 refills | Status: DC | PRN

## 2018-03-23 MED ORDER — DOXYCYCLINE HYCLATE 100 MG PO TABS: 100.0000 mg | ORAL_TABLET | Freq: Two times a day (BID) | ORAL | 0 refills | Status: AC

## 2018-03-23 NOTE — Telephone Encounter (Signed)
LM x 1 for CV Prescreen - speak with Ivette Castronova only

## 2018-03-23 NOTE — Progress Notes (Signed)
Gary Yates. DOB: February 12, 1933 Encounter date: 03/23/2018  This is a 85 y.o. male who presents with Chief Complaint  Patient presents with  . Cough    productive cough, light green mucus, x 3days, no fever    History of present illness: No known sick exposures, no recent travel.  Hasn't taken lasix in 3 days (at least); skips when he will need to be out doing things. He does urinate significantly after taking the lasix. He does not weight himself at home regularly.  Started with productive cough with green mucous 3 days ago. No fevers. No other URI symptoms. Feels a little tired. No chest pain, chest pressure. Denies shortness of breath or wheezing.   Denies knowledge of COPD documented in med hx and denies use of inhalers (looks like there has been some intermittent use of inhalers in past).     Allergies  Allergen Reactions  . Ace Inhibitors Other (See Comments)    cough  . Codeine Nausea Only and Rash       . Penicillins Rash    Childhood allergy Has patient had a PCN reaction causing immediate rash, facial/tongue/throat swelling, SOB or lightheadedness with hypotension: Yes Has patient had a PCN reaction causing severe rash involving mucus membranes or skin necrosis: Yes Has patient had a PCN reaction that required hospitalization No Has patient had a PCN reaction occurring within the last 10 years: No If all of the above answers are "NO", then may proceed with Cephalosporin use.    No outpatient medications have been marked as taking for the 03/23/18 encounter (Office Visit) with Caren Macadam, MD.    Review of Systems  Constitutional: Positive for fatigue. Negative for chills and fever.  Respiratory: Positive for cough. Negative for chest tightness, shortness of breath and wheezing.   Cardiovascular: Positive for leg swelling (slightly worse than normal). Negative for chest pain and palpitations.    Objective:  BP 120/64 (BP Location: Left Arm, Patient  Position: Sitting, Cuff Size: Normal)   Pulse 80   Temp 97.9 F (36.6 C) (Oral)   SpO2 95%       BP Readings from Last 3 Encounters:  03/23/18 120/64  01/23/18 (!) 110/48  01/23/18 (!) 108/58   Wt Readings from Last 3 Encounters:  01/23/18 223 lb 11.2 oz (101.5 kg)  01/16/18 229 lb (103.9 kg)  12/02/17 218 lb 3.2 oz (99 kg)    Physical Exam Constitutional:      General: He is not in acute distress.    Appearance: He is well-developed. He is not ill-appearing or toxic-appearing.  Cardiovascular:     Rate and Rhythm: Normal rate and regular rhythm.     Heart sounds: Murmur present. Systolic murmur present with a grade of 2/6. No friction rub.  Pulmonary:     Effort: Pulmonary effort is normal. No respiratory distress.     Breath sounds: Examination of the left-upper field reveals rales. Rales present. No wheezing or rhonchi.  Musculoskeletal:     Right lower leg: 2+ Pitting Edema present.     Left lower leg: 2+ Pitting Edema present.  Neurological:     Mental Status: He is alert and oriented to person, place, and time.  Psychiatric:        Behavior: Behavior normal.     Assessment/Plan  1. Cough  I am concerned about pneumonia from exam, but I also feel there is some fluid overload. He is clinically stable. I have asked him to restart  his lasix on daily basis to help with any excess fluid, and start doxycycline as directed. Coumadin nurse was made aware of current antibiotic and able to touch base with him on dosing while at office today. Let us know if any worsening of symptoms. We will check in weds with him. Goal is to try and keep him out of office for management to decrease risk of additional exposures.     Return if symptoms worsen or fail to improve.   Micheline Rough, MD

## 2018-03-23 NOTE — Telephone Encounter (Signed)
Questions for Screening COVID-19  Symptom onset:   Cough with grey/Lanissa Cashen mucous, deep cough -- worsening x 2-3 days No SOB, no fever  Travel or Contacts:   No travel No contact with anyone sick or that have traveled.  During this illness, did/does the patient experience any of the following symptoms? Fever >100.87F []   Yes [x]   No []   Unknown Subjective fever (felt feverish) [x]   Yes []   No []   Unknown Chills [x]   Yes []   No []   Unknown Muscle aches (myalgia) []   Yes [x]   No []   Unknown Runny nose (rhinorrhea) [x]   Yes []   No []   Unknown Sore throat []   Yes [x]   No []   Unknown Cough (new onset or worsening of chronic cough) [x]   Yes []   No []   Unknown Shortness of breath (dyspnea) []   Yes [x]   No []   Unknown Nausea or vomiting []   Yes [x]   No []   Unknown Headache []   Yes [x]   No []   Unknown Abdominal pain  []   Yes [x]   No []   Unknown Diarrhea (?3 loose/looser than normal stools/24hr period) []   Yes []   No [x]   Unknown Other, specify:_____________________________________________   Patient risk factors: Smoker? []   Current []   Former [x]   Never If male, currently pregnant? []   Yes [x]   No  Patient Active Problem List   Diagnosis Date Noted  . OSA (obstructive sleep apnea) 11/26/2017  . Iron deficiency anemia due to chronic blood loss 08/08/2017  . Sepsis secondary to UTI ([place]) 06/17/2017  . Thrombocytopenia ([place]) 06/17/2017  . Acute urinary retention 06/17/2017  . Sepsis ([place]) 06/06/2017  . Anemia in chronic renal disease 05/07/2017  . Long term (current) use of anticoagulants 12/10/2016  . V-tach ([place]) 07/02/2016  . Protein-calorie malnutrition, severe 07/02/2016  . Bilateral lower extremity edema 07/01/2016  . Bilateral leg edema 06/11/2016  . Diffuse non-Hodgkin's lymphoma of testis ([place]) 09/28/2015  . Lumbosacral spondylosis with radiculopathy 02/07/2015  . At high risk for falls 05/05/2014  . Encounter for therapeutic drug monitoring 03/15/2013  . Chest pain  02/03/2013  . Obesity (BMI 30-39.9) 09/21/2012  . Poorly controlled type 2 diabetes mellitus with neuropathy ([place]) 07/03/2012  . Chronic kidney disease, stage III (moderate) ([place]) 07/03/2012  . Restless leg syndrome 12/04/2011  . Long term current use of anticoagulant therapy 07/15/2011  . Paroxysmal atrial fibrillation ([place]) 06/21/2011  . Upper extremity weakness 06/21/2011  . Midsternal chest pain 06/19/2011  . Coronary artery disease   . Hypertension   . Hyperlipidemia   . COPD (chronic obstructive pulmonary disease) ([place])   . GERD (gastroesophageal reflux disease)   . Peripheral neuropathy   . Dyslipidemia 03/27/2011  . Diabetic peripheral neuropathy ([place]) 12/17/2010  . Hypotestosteronism 05/07/2010  . LEG CRAMPS, IDIOPATHIC 08/14/2009  . Insomnia 08/14/2009  . Malignant neoplasm of skin of face 08/14/2009  . OBESITY 08/02/2009  . HYPERTENSION, HEART CONTROLLED W/O ASSOC CHF 03/28/2008  . CAD, NATIVE VESSEL 03/28/2008  . G E R D 01/12/2007  . Morbid obesity ([place]) 09/15/2006  . Hyperlipidemia, mixed 07/01/2006  . Essential hypertension 07/01/2006  . Coronary atherosclerosis 07/01/2006  . COPD 07/01/2006  . OSTEOARTHRITIS 07/01/2006    Plan:  []   High risk for COVID-19 with red flags go to ED (with CP, SOB, weak/lightheaded, or fever > 101.5). Call ahead.  [x]   High risk for COVID-19 but stable will have car visit. Inform provider and coordinate time. Will be completed in afternoon. []   No red flags but URI signs or symptoms will go through side door and be seen in dedicated room.  Note: Referral to telemedicine is an appropriate alternative disposition for higher risk but stable. [facility]/e-Visit: [phone].

## 2018-03-23 NOTE — Patient Instructions (Signed)
Please limit exposure to others to protect yourself from further illness.   OK to use mucinex (plain - check with pharmacist) to help with breaking up cough.   Please take your lasix daily to help with getting rid of some fluid.   Take antibiotic as directed.

## 2018-03-27 ENCOUNTER — Telehealth: Payer: Self-pay | Admitting: Family Medicine

## 2018-03-27 NOTE — Telephone Encounter (Signed)
Called patient and gave him the message from Dr. Elease Hashimoto and patient verbalized an understanding. I advised to patient that he can ask his pharmacist if there was a good suggestion for OTC that is safe for diabetics also. Patient verbalized an understanding.

## 2018-03-27 NOTE — Telephone Encounter (Signed)
Unless he has severe cough, he should not remain on this.  High dose of hydrocodone and can lead to dependency.  Recommend try to find alternative therapy.  Has he tried OTC Delsym?

## 2018-03-27 NOTE — Telephone Encounter (Signed)
Copied from [place] [id number]. Topic: Quick Communication - Rx Refill/Question >> Mar 27, 2018  8:43 AM Burchel, Abbi R wrote: Medication: chlorpheniramine-HYDROcodone (TUSSIONEX PENNKINETIC ER) 10-8 MG/5ML SUER  Pt states he is taking this medication twice daily and was concerned about running out over the weekend.  Please advise.   Preferred Pharmacy: [id number] - [place], [place]  [phone] (Phone) [phone] (Fax)    Pt was advised that RX refills may take up to 3 business days. We ask that you follow-up with your pharmacy.

## 2018-03-27 NOTE — Telephone Encounter (Signed)
Please advise if OK to continue this medication.

## 2018-03-31 ENCOUNTER — Ambulatory Visit: Payer: Self-pay

## 2018-03-31 NOTE — Telephone Encounter (Signed)
Did he take the Doxycycline?  Suspect this will just take more time.  With ANY antibiotic cough can last 3-4 weeks.  Would watch mostly for fever or increased shortness of breath.

## 2018-03-31 NOTE — Telephone Encounter (Signed)
Called patient and gave him message from Dr. Elease Hashimoto. Advised to patient that he can continue the Mucinex and Delsym cough syrup and if he has any changes where he does not feel good to please call us and we can do a telephone appointment if we need to. Patient verbalized an understanding.

## 2018-03-31 NOTE — Telephone Encounter (Signed)
Patient called in and says that his cough is not clearing up and he would like something stronger. He says when he coughs, sometimes what comes up is grey, thicker than mucus out the nose and sometimes it's clear. He says he's been using the mucinex and delsym, which is helping, but not getting rid of it. He says he feel fine, no fever, no SOB. I advised the patient that I will send this to Dr. Elease Hashimoto and someone will call with his recommendation.   Reason for Disposition . [1] Caller has NON-URGENT question (includes prescribed medication questions) AND [2] triager unable to answer  Answer Assessment - Initial Assessment Questions 1. MAIN CONCERN OR SYMPTOM:  "What is your main concern right now?" "What question do you have?" "What's the main symptom you're worried about?" (e.g., breathing difficulty, cough, fever. pain)     Cough 2. ONSET: "When did the start?"     N/A 3. BETTER-SAME-WORSE: "Are you getting better, staying the same, or getting worse compared to how you felt at your last visit to the doctor (most recent medical visit)"?     Better, just the cough is not going away 4. VISIT DATE: "When were you seen?" (Date)     03/23/18 5. VISIT DOCTOR: "What is the name of the doctor taking care of you now?"     [place] 6. VISIT DIAGNOSIS:  "What was the main symptom or problem that you were seen for?" "Were you given a diagnosis?"      N/A 7. VISIT MEDICATIONS: "Did the physician order any new medicines for you to use?" If yes: "Have you filled the prescription and started taking the medicine?"      Yes 8. NEXT APPOINTMENT: "Have you scheduled a follow-up appointment with your doctor?"     No 9. PAIN: "Is there any pain?" If so, ask: "How bad is it?"  (Scale 1-10; or mild, moderate, severe)     No 10. FEVER: "Do you have a fever?" If so, ask: "What is it, how was it measured  and when did it start?"       No 11. OTHER SYMPTOMS: "Do you have any other symptoms?"        No  Protocols used: RECENT MEDICAL VISIT FOR ILLNESS FOLLOW-UP CALL-A-AH

## 2018-03-31 NOTE — Telephone Encounter (Signed)
Please see message. °

## 2018-04-07 ENCOUNTER — Other Ambulatory Visit: Payer: Medicare Other

## 2018-04-07 ENCOUNTER — Ambulatory Visit: Payer: Medicare Other | Admitting: Hematology & Oncology

## 2018-04-07 ENCOUNTER — Ambulatory Visit: Payer: Medicare Other

## 2018-04-08 ENCOUNTER — Encounter: Payer: Self-pay | Admitting: Hematology & Oncology

## 2018-04-08 ENCOUNTER — Other Ambulatory Visit: Payer: Self-pay

## 2018-04-08 ENCOUNTER — Inpatient Hospital Stay: Payer: Medicare Other

## 2018-04-08 ENCOUNTER — Inpatient Hospital Stay (HOSPITAL_BASED_OUTPATIENT_CLINIC_OR_DEPARTMENT_OTHER): Payer: Medicare Other | Admitting: Hematology & Oncology

## 2018-04-08 ENCOUNTER — Inpatient Hospital Stay: Payer: Medicare Other | Attending: Hematology & Oncology

## 2018-04-08 ENCOUNTER — Other Ambulatory Visit: Payer: Self-pay | Admitting: Family Medicine

## 2018-04-08 VITALS — BP 118/68 | HR 78 | Temp 97.8°F | Resp 18 | Ht 70.0 in | Wt 225.0 lb

## 2018-04-08 DIAGNOSIS — Z7901 Long term (current) use of anticoagulants: Secondary | ICD-10-CM

## 2018-04-08 DIAGNOSIS — C8589 Other specified types of non-Hodgkin lymphoma, extranodal and solid organ sites: Secondary | ICD-10-CM

## 2018-04-08 DIAGNOSIS — R600 Localized edema: Secondary | ICD-10-CM | POA: Diagnosis not present

## 2018-04-08 DIAGNOSIS — Z9221 Personal history of antineoplastic chemotherapy: Secondary | ICD-10-CM

## 2018-04-08 DIAGNOSIS — Z923 Personal history of irradiation: Secondary | ICD-10-CM | POA: Diagnosis not present

## 2018-04-08 DIAGNOSIS — Z79899 Other long term (current) drug therapy: Secondary | ICD-10-CM | POA: Diagnosis not present

## 2018-04-08 DIAGNOSIS — Z9225 Personal history of immunosupression therapy: Secondary | ICD-10-CM | POA: Diagnosis not present

## 2018-04-08 DIAGNOSIS — D500 Iron deficiency anemia secondary to blood loss (chronic): Secondary | ICD-10-CM

## 2018-04-08 LAB — CMP (CANCER CENTER ONLY)
ALT: 8 U/L (ref 0–44)
AST: 18 U/L (ref 15–41)
Albumin: 3.9 g/dL (ref 3.5–5.0)
Alkaline Phosphatase: 87 U/L (ref 38–126)
Anion gap: 8 (ref 5–15)
BUN: 12 mg/dL (ref 8–23)
CO2: 27 mmol/L (ref 22–32)
Calcium: 9 mg/dL (ref 8.9–10.3)
Chloride: 105 mmol/L (ref 98–111)
Creatinine: 1.14 mg/dL (ref 0.61–1.24)
GFR, Est AFR Am: >60 mL/min (ref >60)
GFR, Estimated: 59 mL/min — ABNORMAL LOW (ref >60)
Glucose, Bld: 153 mg/dL — ABNORMAL HIGH (ref 70–99)
Potassium: 5.1 mmol/L (ref 3.5–5.1)
Sodium: 140 mmol/L (ref 135–145)
Total Bilirubin: 0.3 mg/dL (ref 0.3–1.2)
Total Protein: 6.8 g/dL (ref 6.5–8.1)

## 2018-04-08 LAB — CBC WITH DIFFERENTIAL (CANCER CENTER ONLY)
Abs Immature Granulocytes: 0.02 10*3/uL (ref 0.00–0.07)
Basophils Absolute: 0 10*3/uL (ref 0.0–0.1)
Basophils Relative: 1 %
Eosinophils Absolute: 0.2 10*3/uL (ref 0.0–0.5)
Eosinophils Relative: 4 %
HCT: 34.7 % — ABNORMAL LOW (ref 39.0–52.0)
Hemoglobin: 11.5 g/dL — ABNORMAL LOW (ref 13.0–17.0)
Immature Granulocytes: 0 %
Lymphocytes Relative: 27 %
Lymphs Abs: 1.6 10*3/uL (ref 0.7–4.0)
MCH: 33.7 pg (ref 26.0–34.0)
MCHC: 33.1 g/dL (ref 30.0–36.0)
MCV: 101.8 fL — ABNORMAL HIGH (ref 80.0–100.0)
Monocytes Absolute: 0.5 10*3/uL (ref 0.1–1.0)
Monocytes Relative: 8 %
Neutro Abs: 3.5 10*3/uL (ref 1.7–7.7)
Neutrophils Relative %: 60 %
Platelet Count: 131 10*3/uL — ABNORMAL LOW (ref 150–400)
RBC: 3.41 MIL/uL — ABNORMAL LOW (ref 4.22–5.81)
RDW: 13.2 % (ref 11.5–15.5)
WBC Count: 5.8 10*3/uL (ref 4.0–10.5)
nRBC: 0 % (ref 0.0–0.2)

## 2018-04-08 LAB — RETICULOCYTES
Immature Retic Fract: 18.4 % — ABNORMAL HIGH (ref 2.3–15.9)
RBC.: 3.35 MIL/uL — ABNORMAL LOW (ref 4.22–5.81)
Retic Count, Absolute: 50.2 10*3/uL (ref 19.0–186.0)
Retic Ct Pct: 1.5 % (ref 0.4–3.1)

## 2018-04-08 LAB — LACTATE DEHYDROGENASE: LDH: 206 U/L — ABNORMAL HIGH (ref 98–192)

## 2018-04-08 NOTE — Progress Notes (Signed)
Per Dr. Marin Olp, no injection needed today for a Hemoglobin of 11.4. Patient verbalized understanding.

## 2018-04-08 NOTE — Progress Notes (Signed)
Due to a shortage of port-a-cath kits, labs were drawn from arm.

## 2018-04-08 NOTE — Progress Notes (Signed)
Hematology and Oncology Follow Up Visit  Gary Yates [id number] 05-Nov-1933 85 y.o. 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  He actually is doing quite well.  He says he feels pretty good.  Despite the coronavirus, he really has not had much in the way of difficulties.  He does have chronic back issues.  He says this has not caused any further problems for him.  He has heart issues.  He is on Coumadin.  He has had no bleeding.  He has had no issues with heart failure.  There is been no nausea or vomiting.  He has chronic leg swelling.  He does not sleep all that well.  His last PET scan was done back in November 2019.  This did not show any evidence of recurrent lymphoma.  Overall, his performance status is ECOG 2-3.    Medications:  Current Outpatient Medications:  .  ACCU-CHEK AVIVA PLUS test strip, TEST 3 TIMES A DAY (Patient taking differently: TEST 2 TIMES A DAY), Disp: 300 each, Rfl: 5 .  acetaminophen (TYLENOL) 500 MG tablet, Take 1,000 mg by mouth every 6 (six) hours as needed (for pain/fever/headaches.). , Disp: , Rfl:  .  atorvastatin (LIPITOR) 40 MG tablet, TAKE 1 TABLET BY MOUTH EVERY DAY, Disp: 90 tablet, Rfl: 0 .  azithromycin (ZITHROMAX Z-PAK) 250 MG tablet, Take as directed, Disp: 6 each, Rfl: 0 .  chlorpheniramine-HYDROcodone (TUSSIONEX PENNKINETIC ER) 10-8 MG/5ML SUER, Take 5 mLs by mouth every 12 (twelve) hours as needed for cough., Disp: 120 mL, Rfl: 0 .   docusate sodium (COLACE) 50 MG capsule, Take 1 capsule (50 mg total) by mouth 2 (two) times daily as needed (for constipation)., Disp: 90 capsule, Rfl: 0 .  furosemide (LASIX) 40 MG tablet, Take one tablet by mouth every other day., Disp: 30 tablet, Rfl: 0 .  gabapentin (NEURONTIN) 300 MG capsule, TAKE 2 CAPSULES BY MOUTH 3 TIMES A DAY, Disp: 540 capsule, Rfl: 3 .  HYDROcodone-acetaminophen (NORCO) 10-325 MG tablet, Take 1-2 tablets by mouth every 4 (four) hours as needed for moderate pain. Maximum dose per 24 hours - 8 pills, Disp: 20 tablet, Rfl: 0 .  KLOR-CON M20 20 MEQ tablet, TAKE 1 TABLET BY MOUTH EVERY DAY, Disp: 30 tablet, Rfl: 2 .  magic mouthwash w/lidocaine SOLN, Take 5 mLs by mouth 4 (four) times daily as needed for mouth pain. 1:1 ratio. DIPHENHYDRAMINE HCL (ANTIHISTAMINES - ETHANOLAMINES),ALUM & MAG HYDROXIDE-SIMETH,NYSTATIN (ANTI-INFECTIVES - THROAT),LIDOCAINE HCL (ANESTHETICS TOPICAL ORAL), Disp: 600 mL, Rfl: 3 .  Melatonin 1 MG TABS, Take 1 tablet by mouth at bedtime. , Disp: , Rfl:  .  metFORMIN (GLUCOPHAGE) 500 MG tablet, TAKE 1 TABLET BY MOUTH TWICE A DAY WITH A MEAL, Disp: 180 tablet, Rfl: 2 .  metoprolol succinate (TOPROL XL) 25 MG 24 hr tablet, Take 1 tablet (25 mg total) by mouth  daily., Disp: 30 tablet, Rfl: 11 .  Multiple Vitamins-Minerals (MULTIVITAMIN WITH MINERALS) tablet, Take 1 tablet by mouth daily., Disp: , Rfl:  .  nitroGLYCERIN (NITROSTAT) 0.4 MG SL tablet, Place 1 tablet (0.4 mg total) under the tongue every 5 (five) minutes as needed. Chest pain, Disp: 25 tablet, Rfl: 6 .  nystatin (MYCOSTATIN/NYSTOP) powder, APPLY TO AFFECTED AREA 4 TIMES A DAY, Disp: 60 g, Rfl: 0 .  nystatin-triamcinolone ointment (MYCOLOG), Apply 1 application topically 2 (two) times daily., Disp: 15 g, Rfl: 0 .  pantoprazole (PROTONIX) 40 MG tablet, TAKE 1 TABLET BY MOUTH EVERY DAY, Disp: 90 tablet, Rfl: 3 .  polyethylene glycol (MIRALAX / GLYCOLAX) packet, Take 17 g by mouth daily., Disp: 14  each, Rfl: 0 .  pramipexole (MIRAPEX) 1.5 MG tablet, Take 1 tablet (1.5 mg total) by mouth 2 (two) times daily., Disp: 60 tablet, Rfl: 5 .  TOUJEO SOLOSTAR 300 UNIT/ML SOPN, INJECT 20 UNITS INTO THE SKIN DAILY AFTER BREAKFAST, Disp: 4.5 pen, Rfl: 2 .  vitamin B-12 (CYANOCOBALAMIN) 1000 MCG tablet, Take 1 tablet (1,000 mcg total) by mouth daily., Disp: 30 tablet, Rfl: 0 .  warfarin (COUMADIN) 5 MG tablet, Take 1 1/2 tablets daily except take 1 tablet on Monday Wed Friday., Disp: 45 tablet, Rfl: 3 No current facility-administered medications for this visit.   Facility-Administered Medications Ordered in Other Visits:  .  acetaminophen (TYLENOL) tablet 650 mg, 650 mg, Oral, Once, [place], Sarah M, NP .  pegfilgrastim (NEULASTA ONPRO KIT) injection 6 mg, 6 mg, Subcutaneous, Once, Aureliano Oshields R, MD .  sodium chloride flush (NS) 0.9 % injection 10 mL, 10 mL, Intracatheter, PRN, Volanda Napoleon, MD, 10 mL at 02/27/17 1503 .  sodium chloride flush (NS) 0.9 % injection 10 mL, 10 mL, Intravenous, PRN, Volanda Napoleon, MD, 10 mL at 05/07/17 4287  Allergies:  Allergies  Allergen Reactions  . Ace Inhibitors Other (See Comments)    cough  . Codeine Nausea Only and Rash       . Penicillins Rash    Childhood allergy Has patient had a PCN reaction causing immediate rash, facial/tongue/throat swelling, SOB or lightheadedness with hypotension: Yes Has patient had a PCN reaction causing severe rash involving mucus membranes or skin necrosis: Yes Has patient had a PCN reaction that required hospitalization No Has patient had a PCN reaction occurring within the last 10 years: No If all of the above answers are "NO", then may proceed with Cephalosporin use.     Past Medical History, Surgical history, Social history, and Family History were reviewed and updated.  Review of Systems: Review of Systems  HENT: Positive for sinus pain and sore throat.   Eyes: Positive for blurred vision.   Respiratory: Positive for wheezing.   Cardiovascular: Positive for leg swelling.  Gastrointestinal: Negative.   Genitourinary: Negative.   Musculoskeletal: Positive for back pain.  Skin: Negative.   Neurological: Positive for weakness.  Endo/Heme/Allergies: Negative.   Psychiatric/Behavioral: Negative.     Physical Exam:  height is _0  (1.778 m) and weight is 225 lb (102.1 kg). His oral temperature is 97.8 F (36.6 C). His blood pressure is 118/68 and his pulse is 78. His respiration is 18 and oxygen saturation is 98%.   Wt Readings from Last 3 Encounters:  04/08/18 225 lb (102.1 kg)  03/23/18 228 lb 9.6 oz (103.7 kg)  01/23/18 223 lb 11.2 oz (101.5 kg)      Physical Exam Vitals signs reviewed.  HENT:  Head: Normocephalic and atraumatic.  Eyes:     Pupils: Pupils are equal, round, and reactive to light.  Neck:     Musculoskeletal: Normal range of motion.  Cardiovascular:     Rate and Rhythm: Normal rate and regular rhythm.     Heart sounds: Normal heart sounds.  Pulmonary:     Effort: Pulmonary effort is normal.     Breath sounds: Normal breath sounds.  Abdominal:     General: Bowel sounds are normal.     Palpations: Abdomen is soft.  Musculoskeletal: Normal range of motion.        General: No tenderness or deformity.  Lymphadenopathy:     Cervical: No cervical adenopathy.  Skin:    General: Skin is warm and dry.     Findings: No erythema or rash.  Neurological:     Mental Status: He is alert and oriented to person, place, and time.  Psychiatric:        Behavior: Behavior normal.        Thought Content: Thought content normal.        Judgment: Judgment normal.      Lab Results  Component Value Date   WBC 5.8 04/08/2018   HGB 11.5 (L) 04/08/2018   HCT 34.7 (L) 04/08/2018   MCV 101.8 (H) 04/08/2018   PLT 131 (L) 04/08/2018     Chemistry      Component Value Date/Time   NA 140 04/08/2018 1050   NA 141 01/10/2017 1136   NA 137 12/14/2015 1100    K 5.1 04/08/2018 1050   K 4.4 01/10/2017 1136   K 3.7 12/14/2015 1100   CL 105 04/08/2018 1050   CL 104 01/10/2017 1136   CO2 27 04/08/2018 1050   CO2 27 01/10/2017 1136   CO2 24 12/14/2015 1100   BUN 12 04/08/2018 1050   BUN 19 01/10/2017 1136   BUN 18.4 12/14/2015 1100   CREATININE 1.14 04/08/2018 1050   CREATININE 1.1 01/10/2017 1136   CREATININE 0.9 12/14/2015 1100      Component Value Date/Time   CALCIUM 9.0 04/08/2018 1050   CALCIUM 9.4 01/10/2017 1136   CALCIUM 9.5 12/14/2015 1100   ALKPHOS 87 04/08/2018 1050   ALKPHOS 112 (H) 01/10/2017 1136   ALKPHOS 102 12/14/2015 1100   AST 18 04/08/2018 1050   AST 22 12/14/2015 1100   ALT 8 04/08/2018 1050   ALT 18 01/10/2017 1136   ALT 10 12/14/2015 1100   BILITOT 0.3 04/08/2018 1050   BILITOT 0.58 12/14/2015 1100      Impression and Plan: Mr. Naji is an 85 year old white male. He has relapsed large cell non-Hodgkin lymphoma of the testicle. He was on salvage chemotherapy with Rituxan/bendamustine/Velcade.  He did not really respond well to this.  We then treated him with dose reduced R-ICE.  He responded very well to this.  His last PET scan showed that he was in remission.    Today, we did not have to give him any Aranesp.  We will see what his iron levels are.  I think he did get some IV iron back in January.  This worked very very nicely.  I will see him back in 6 weeks.  I want to make sure we get him back to make sure his blood counts do not drop too much.  He does have a low erythropoietin level.  We will see him back, we will flush his port a patch.     Rudell Cobb  Marin Olp, MD 4/1/202012:06 PM

## 2018-04-09 ENCOUNTER — Telehealth: Payer: Self-pay | Admitting: Hematology & Oncology

## 2018-04-09 ENCOUNTER — Ambulatory Visit (INDEPENDENT_AMBULATORY_CARE_PROVIDER_SITE_OTHER): Payer: Medicare Other | Admitting: General Practice

## 2018-04-09 DIAGNOSIS — I480 Paroxysmal atrial fibrillation: Secondary | ICD-10-CM

## 2018-04-09 DIAGNOSIS — Z7901 Long term (current) use of anticoagulants: Secondary | ICD-10-CM | POA: Diagnosis not present

## 2018-04-09 LAB — IRON AND TIBC
Iron: 73 ug/dL (ref 42–163)
Saturation Ratios: 38 % (ref 20–55)
TIBC: 194 ug/dL — ABNORMAL LOW (ref 202–409)
UIBC: 121 ug/dL (ref 117–376)

## 2018-04-09 LAB — POCT INR: INR: 2.5 (ref 2.0–3.0)

## 2018-04-09 LAB — FERRITIN: Ferritin: 594 ng/mL — ABNORMAL HIGH (ref 24–336)

## 2018-04-09 NOTE — Patient Instructions (Addendum)
Pre visit review using our clinic review tool, if applicable. No additional management support is needed unless otherwise documented below in the visit note.  Continue to take 7.5 daily except 5 mg on Monday/Wed/Fridays. Dosing instructions given to Olivia Mackie, RN @ [facility] [phone] .  Please re-check in 2 weeks.

## 2018-04-09 NOTE — Telephone Encounter (Signed)
lmom to inform pt of 6/week follow up 5/14 at 1 pm per 4/1 LOS

## 2018-04-10 ENCOUNTER — Other Ambulatory Visit: Payer: Self-pay | Admitting: Family Medicine

## 2018-04-16 ENCOUNTER — Telehealth: Payer: Self-pay | Admitting: *Deleted

## 2018-04-16 NOTE — Telephone Encounter (Signed)
Copied from [place] [phone]. Topic: Appointment Scheduling - Scheduling Inquiry for Clinic >> Apr 16, 2018  9:32 AM Margot Ables wrote: Reason for CRM: pt dtr called about appt 04/20/2018 - would it be possible for Dr. Elease Hashimoto to do a telephone call with the pt. Would labs be postponed or need to come in for those? They do not have smartphone/tablet/computer with video. Please call dtr [name] to advise.

## 2018-04-16 NOTE — Telephone Encounter (Signed)
Called daughter [name] and she stated that she pays for minutes and she is not able to use her phone so the best number would be her dads home telephone for visit on Monday 04/20/18.

## 2018-04-20 ENCOUNTER — Ambulatory Visit: Payer: Medicare Other | Admitting: Family Medicine

## 2018-04-20 ENCOUNTER — Ambulatory Visit (INDEPENDENT_AMBULATORY_CARE_PROVIDER_SITE_OTHER): Payer: Medicare Other | Admitting: Family Medicine

## 2018-04-20 ENCOUNTER — Other Ambulatory Visit: Payer: Self-pay

## 2018-04-20 DIAGNOSIS — Z9181 History of falling: Secondary | ICD-10-CM | POA: Diagnosis not present

## 2018-04-20 DIAGNOSIS — E1140 Type 2 diabetes mellitus with diabetic neuropathy, unspecified: Secondary | ICD-10-CM

## 2018-04-20 DIAGNOSIS — G2581 Restless legs syndrome: Secondary | ICD-10-CM | POA: Diagnosis not present

## 2018-04-20 DIAGNOSIS — E1165 Type 2 diabetes mellitus with hyperglycemia: Secondary | ICD-10-CM

## 2018-04-20 DIAGNOSIS — R7989 Other specified abnormal findings of blood chemistry: Secondary | ICD-10-CM | POA: Diagnosis not present

## 2018-04-20 NOTE — Progress Notes (Signed)
Patient ID: Gary Coffelt., male   DOB: 1933/10/23, 85 y.o.   MRN: [id number]  Virtual Visit via Video Note  I connected with Gary Yates on 04/20/18 at  3:00 PM EDT by a video enabled telemedicine application and verified that I am speaking with the correct person using two identifiers.  Location patient: home Location provider:work or home office Persons participating in the virtual visit: patient, provider  I discussed the limitations of evaluation and management by telemedicine and the availability of in person appointments. The patient expressed understanding and agreed to proceed.   HPI: Patient has multiple chronic problems including history of atrial fibrillation, hypertension, CAD, COPD, type 2 diabetes, obstructive sleep apnea, diabetic peripheral neuropathy, restless leg syndrome, non-Hodgkin's lymphoma, chronic kidney disease.  We discussed the following issues today  Elevated TSH by labs back in January.  His TSH was 5.30.  He feels generally stable.  Appetite and weight are stable.  No increased peripheral edema.  We had planned to get follow-up thyroid functions at this time  Type 2 diabetes.  History of good control.  Last A1c 6.4%.  Not monitoring blood sugars regularly.  They have been stable generally between 118 and 132 fasting  Restless leg syndrome.  Patient has frequent jerking of lower extremities especially at night after he goes to bed.  Already takes Mirapex 3 mg and gabapentin 300 mg and takes 3 capsules twice daily.  Avoids late day use of caffeine.  Recent elevated ferritin.  Recent normal iron studies.  He does take a couple of Tylenol and states that is consistently helping after about an hour     ROS: See pertinent positives and negatives per HPI.  Past Medical History:  Diagnosis Date  . Anemia in chronic renal disease 05/07/2017  . Anxiety   . Atrial fibrillation ([place])   . COPD (chronic obstructive pulmonary disease) ([place])    pt. denies  . Coronary  artery disease    a. h/o Overlapping stents RCA;  b. 06/2011 Cath: patent stents, nonobs dzs, NL EF.  . Diabetic peripheral neuropathy ([place])   . Diffuse non-Hodgkin's lymphoma of testis ([place]) 09/28/2015  . DM (diabetes mellitus) ([place])    Type 2, peripheral neuropathy.  . Dyspnea    with exertion  . Dysrhythmia   . GERD (gastroesophageal reflux disease)   . Headache   . History of bronchitis   . History of kidney stones   . History of radiation therapy 02/19/16 - 03/13/16   Testis/Scrotum: 32.4 Gy in 18 fractions  . History of radiation therapy 08/07/16-08/20/16   left adrenal gland mass treated to 30 Gy in 10 fractions  . Hyperlipidemia   . Hypertension   . Iron deficiency anemia due to chronic blood loss 08/08/2017  . Low testosterone   . Nephrolithiasis   . OSA (obstructive sleep apnea) 11/26/2017  . Osteoarthritis    shoulder  . Restless leg   . SVT (supraventricular tachycardia) ([place])   . Urinary frequency   . Wears partial dentures    upper and lower    Past Surgical History:  Procedure Laterality Date  . APPENDECTOMY    . [place]  . CARDIAC CATHETERIZATION  01/2013  . CATARACT EXTRACTION, BILATERAL    . CHOLECYSTECTOMY    . COLONOSCOPY    . CORONARY ANGIOPLASTY  2004  . CYSTOSCOPY N/A 08/18/2017   Procedure: CYSTOSCOPY WITH FULGURATION AND SUPRA PUBIC TUBE PLACEMENT;  Surgeon: Kathie Rhodes, MD;  Location:  [facility];  Service: Urology;  Laterality: N/A;  . EYE SURGERY Bilateral    cataracts  . IR GENERIC HISTORICAL  10/05/2015   IR US GUIDE VASC ACCESS RIGHT 10/05/2015 Marybelle Killings, MD [facility]  . IR GENERIC HISTORICAL  10/05/2015   IR FLUORO GUIDE PORT INSERTION RIGHT 10/05/2015 Marybelle Killings, MD [facility]  . LEFT HEART CATHETERIZATION WITH CORONARY ANGIOGRAM N/A 06/18/2011   Procedure: LEFT HEART CATHETERIZATION WITH CORONARY ANGIOGRAM;  Surgeon: Peter M Martinique, MD;  Location: [facility] C[facility] LAB;  Service: Cardiovascular;  Laterality: N/A;  . LEFT HEART  CATHETERIZATION WITH CORONARY ANGIOGRAM N/A 01/27/2013   Procedure: LEFT HEART CATHETERIZATION WITH CORONARY ANGIOGRAM;  Surgeon: Burnell Blanks, MD;  Location: [facility] CATH LAB;  Service: Cardiovascular;  Laterality: N/A;  . LUMBAR LAMINECTOMY/DECOMPRESSION MICRODISCECTOMY N/A 02/07/2015   Procedure: Lumbar three-Sacral one Decompression;  Surgeon: Kevan Ny Ditty, MD;  Location: [facility]UR[facility] OR[facility];  Service: Neurosurgery;  Laterality: N/A;  L3 to S1 Decompression  . MULTIPLE TOOTH EXTRACTIONS    . ORCHIECTOMY Right 09/01/2015   Procedure: RIGHT ORCHIECTOMY;  Surgeon: Kathie Rhodes, MD;  Location: [facility];  Service: Urology;  Laterality: Right;  . port a cath in place     . ROTATOR CUFF REPAIR Left     Family History  Problem Relation Age of Onset  . Alzheimer's disease Mother   . Heart disease Mother   . Heart disease Father   . Migraines Father   . Ulcers Father   . Prostate cancer Brother   . Coronary artery disease Other        Male 1st degree relative <50  . Coronary artery disease Other        male 1st degree relative <60  . Heart disease Sister   . Obesity Sister        Morbid  . Arthritis Sister   . Heart disease Brother   . Arthritis Brother   . Sleep apnea Son   . Obesity Son   . Migraines Daughter   . Thyroid disease Daughter     SOCIAL HX: Married.  Very supportive family.  Non-smoker.   Current Outpatient Medications:  .  ACCU-CHEK AVIVA PLUS test strip, TEST 3 TIMES A DAY (Patient taking differently: TEST 2 TIMES A DAY), Disp: 300 each, Rfl: 5 .  acetaminophen (TYLENOL) 500 MG tablet, Take 1,000 mg by mouth every 6 (six) hours as needed (for pain/fever/headaches.). , Disp: , Rfl:  .  atorvastatin (LIPITOR) 40 MG tablet, TAKE 1 TABLET BY MOUTH EVERY DAY, Disp: 30 tablet, Rfl: 2 .  azithromycin (ZITHROMAX Z-PAK) 250 MG tablet, Take as directed, Disp: 6 each, Rfl: 0 .  docusate sodium (COLACE) 50 MG capsule, Take 1 capsule (50 mg total) by mouth 2 (two) times  daily as needed (for constipation)., Disp: 90 capsule, Rfl: 0 .  furosemide (LASIX) 40 MG tablet, Take one tablet by mouth every other day., Disp: 30 tablet, Rfl: 0 .  gabapentin (NEURONTIN) 300 MG capsule, TAKE 2 CAPSULES BY MOUTH 3 TIMES A DAY, Disp: 540 capsule, Rfl: 3 .  KLOR-CON M20 20 MEQ tablet, TAKE 1 TABLET BY MOUTH EVERY DAY, Disp: 30 tablet, Rfl: 2 .  magic mouthwash w/lidocaine SOLN, Take 5 mLs by mouth 4 (four) times daily as needed for mouth pain. 1:1 ratio. DIPHENHYDRAMINE HCL (ANTIHISTAMINES - ETHANOLAMINES),ALUM & MAG HYDROXIDE-SIMETH,NYSTATIN (ANTI-INFECTIVES - THROAT),LIDOCAINE HCL (ANESTHETICS TOPICAL ORAL), Disp: 600 mL, Rfl: 3 .  Melatonin 1 MG TABS, Take 1 tablet by mouth  at bedtime. , Disp: , Rfl:  .  metFORMIN (GLUCOPHAGE) 500 MG tablet, TAKE 1 TABLET BY MOUTH TWICE A DAY WITH A MEAL, Disp: 180 tablet, Rfl: 2 .  metoprolol succinate (TOPROL XL) 25 MG 24 hr tablet, Take 1 tablet (25 mg total) by mouth daily., Disp: 30 tablet, Rfl: 11 .  Multiple Vitamins-Minerals (MULTIVITAMIN WITH MINERALS) tablet, Take 1 tablet by mouth daily., Disp: , Rfl:  .  nitroGLYCERIN (NITROSTAT) 0.4 MG SL tablet, Place 1 tablet (0.4 mg total) under the tongue every 5 (five) minutes as needed. Chest pain, Disp: 25 tablet, Rfl: 6 .  nystatin (MYCOSTATIN/NYSTOP) powder, APPLY TO AFFECTED AREA 4 TIMES A DAY, Disp: 60 g, Rfl: 0 .  nystatin-triamcinolone ointment (MYCOLOG), Apply 1 application topically 2 (two) times daily., Disp: 15 g, Rfl: 0 .  pantoprazole (PROTONIX) 40 MG tablet, TAKE 1 TABLET BY MOUTH EVERY DAY, Disp: 90 tablet, Rfl: 3 .  polyethylene glycol (MIRALAX / GLYCOLAX) packet, Take 17 g by mouth daily., Disp: 14 each, Rfl: 0 .  pramipexole (MIRAPEX) 1.5 MG tablet, Take 1 tablet (1.5 mg total) by mouth 2 (two) times daily., Disp: 60 tablet, Rfl: 5 .  TOUJEO SOLOSTAR 300 UNIT/ML SOPN, INJECT 20 UNITS INTO THE SKIN DAILY AFTER BREAKFAST, Disp: 4.5 pen, Rfl: 2 .  vitamin B-12 (CYANOCOBALAMIN)  1000 MCG tablet, Take 1 tablet (1,000 mcg total) by mouth daily., Disp: 30 tablet, Rfl: 0 .  warfarin (COUMADIN) 5 MG tablet, Take 1 1/2 tablets daily except take 1 tablet on Monday Wed Friday., Disp: 45 tablet, Rfl: 3 No current facility-administered medications for this visit.   Facility-Administered Medications Ordered in Other Visits:  .  acetaminophen (TYLENOL) tablet 650 mg, 650 mg, Oral, Once, [place], Sarah M, NP .  pegfilgrastim (NEULASTA ONPRO KIT) injection 6 mg, 6 mg, Subcutaneous, Once, [place], Peter R, MD .  sodium chloride flush (NS) 0.9 % injection 10 mL, 10 mL, Intracatheter, PRN, Volanda Napoleon, MD, 10 mL at 02/27/17 1503 .  sodium chloride flush (NS) 0.9 % injection 10 mL, 10 mL, Intravenous, PRN, Volanda Napoleon, MD, 10 mL at 05/07/17 9678  EXAM:  VITALS per patient if applicable:  GENERAL: alert, oriented, appears well and in no acute distress  HEENT: atraumatic, conjunttiva clear, no obvious abnormalities on inspection of external nose and ears  NECK: normal movements of the head and neck  LUNGS: on inspection no signs of respiratory distress, breathing rate appears normal, no obvious gross SOB, gasping or wheezing  CV: no obvious cyanosis  MS: moves all visible extremities without noticeable abnormality  PSYCH/NEURO: pleasant and cooperative, no obvious depression or anxiety, speech and thought processing grossly intact  ASSESSMENT AND PLAN:  Discussed the following assessment and plan:  #1 elevated TSH by labs back in January -Put in future order for TSH with labs in May  #2 restless leg syndrome.  Patient is having some ongoing symptoms.  This is in spite of Mirapex and high-dose gabapentin therapy -He is getting some relief with Tylenol and is encouraged to continue with that -We discussed other potential therapy such as low-dose clonazepam but would like to try to avoid given his high risk of falls -Continue with current dose of Mirapex  #3  type 2 diabetes.  History of good control recently following weight loss with his lymphoma.  Last A1c 6.4% -Order placed for future lab with A1c in May     I discussed the assessment and treatment plan with the patient. The  patient was provided an opportunity to ask questions and all were answered. The patient agreed with the plan and demonstrated an understanding of the instructions.   The patient was advised to call back or seek an in-person evaluation if the symptoms worsen or if the condition fails to improve as anticipated.   Carolann Littler, MD

## 2018-04-22 ENCOUNTER — Ambulatory Visit (INDEPENDENT_AMBULATORY_CARE_PROVIDER_SITE_OTHER): Payer: Medicare Other | Admitting: General Practice

## 2018-04-22 DIAGNOSIS — Z7901 Long term (current) use of anticoagulants: Secondary | ICD-10-CM

## 2018-04-22 DIAGNOSIS — I480 Paroxysmal atrial fibrillation: Secondary | ICD-10-CM

## 2018-04-22 LAB — POCT INR: INR: 3.5 — AB (ref 2.0–3.0)

## 2018-04-22 NOTE — Patient Instructions (Signed)
Pre visit review using our clinic review tool, if applicable. No additional management support is needed unless otherwise documented below in the visit note.  Hold coumadin today and take 1 tablet tomorrow (4/16) and then continue to take 7.5 daily except 5 mg on Monday/Wed/Fridays. Dosing instructions given to Olivia Mackie, RN @ [facility] [phone] .  Please re-check in 2 weeks.

## 2018-04-24 ENCOUNTER — Ambulatory Visit: Payer: Medicare Other | Admitting: Family Medicine

## 2018-04-30 ENCOUNTER — Telehealth: Payer: Self-pay | Admitting: Family Medicine

## 2018-04-30 NOTE — Telephone Encounter (Signed)
Copied from [place] [id number]. Topic: Quick Communication - See Telephone Encounter >> Apr 30, 2018  4:38 PM Antonieta Iba C wrote: CRM for notification. See Telephone encounter for: 04/30/18.  Estill Bamberg RN w/ [facility] called in because she received a call from the pt stating that he has been feeling a little unsteady on his feet lately. Estill Bamberg would like to know if provider would okay PT going out to the home to complete a eval on pt?   [phone]

## 2018-05-01 NOTE — Telephone Encounter (Signed)
Gary Yates and gave her the verbal OK per Dr. Elease Hashimoto. Estill Bamberg verbalized an understanding.

## 2018-05-01 NOTE — Telephone Encounter (Signed)
OK- if OK with patient.

## 2018-05-01 NOTE — Telephone Encounter (Signed)
Please see message. °

## 2018-05-04 ENCOUNTER — Other Ambulatory Visit: Payer: Self-pay | Admitting: Family Medicine

## 2018-05-07 ENCOUNTER — Ambulatory Visit (INDEPENDENT_AMBULATORY_CARE_PROVIDER_SITE_OTHER): Payer: Medicare Other | Admitting: General Practice

## 2018-05-07 ENCOUNTER — Telehealth: Payer: Self-pay | Admitting: Family Medicine

## 2018-05-07 DIAGNOSIS — Z7901 Long term (current) use of anticoagulants: Secondary | ICD-10-CM | POA: Diagnosis not present

## 2018-05-07 DIAGNOSIS — I480 Paroxysmal atrial fibrillation: Secondary | ICD-10-CM

## 2018-05-07 LAB — POCT INR: INR: 3.7 — AB (ref 2.0–3.0)

## 2018-05-07 NOTE — Telephone Encounter (Signed)
Do you want me to set up in office visit tomorrow?

## 2018-05-07 NOTE — Patient Instructions (Signed)
Pre visit review using our clinic review tool, if applicable. No additional management support is needed unless otherwise documented below in the visit note.  Hold coumadin today(4/30) and then change dosage and take  7.5 daily except 5 mg on Monday/Wed/Fridays/Saturdays.   Dosing instructions given to Olivia Mackie, RN @ [facility] [phone] .  Please re-check in 2 weeks.

## 2018-05-07 NOTE — Telephone Encounter (Signed)
Gary Yates with [facility] and gave her the message from Dr. Elease Hashimoto. I also called patients daughter [name] and she has set up a Doxy meeting tomorrow at 2:30 pm on her brother [name] cell number [phone]. [name] verbalized an understanding.

## 2018-05-07 NOTE — Telephone Encounter (Signed)
Copied from [place] [phone]. Topic: Quick Communication - See Telephone Encounter >> May 07, 2018 10:33 AM Sheran Luz wrote: CRM for notification. See Telephone encounter for: 05/07/18.  Olivia Mackie, RN with [facility], calling to inform Dr. Elease Hashimoto that patient is experiencing redness in his right eye. She states that when he woke up this morning, the eye was "crusty" and seemed to have a film over it. She is requesting a call back from [place] or PCP to advise.

## 2018-05-07 NOTE — Telephone Encounter (Signed)
I think we can set up Doxy.   In the meantime, try warm compresses several times daily.

## 2018-05-08 ENCOUNTER — Other Ambulatory Visit: Payer: Self-pay

## 2018-05-08 ENCOUNTER — Encounter: Payer: Self-pay | Admitting: Family Medicine

## 2018-05-08 ENCOUNTER — Ambulatory Visit (INDEPENDENT_AMBULATORY_CARE_PROVIDER_SITE_OTHER): Payer: Medicare Other | Admitting: Family Medicine

## 2018-05-08 DIAGNOSIS — G2581 Restless legs syndrome: Secondary | ICD-10-CM

## 2018-05-08 DIAGNOSIS — H1031 Unspecified acute conjunctivitis, right eye: Secondary | ICD-10-CM | POA: Diagnosis not present

## 2018-05-08 DIAGNOSIS — E1140 Type 2 diabetes mellitus with diabetic neuropathy, unspecified: Secondary | ICD-10-CM | POA: Diagnosis not present

## 2018-05-08 DIAGNOSIS — E1165 Type 2 diabetes mellitus with hyperglycemia: Secondary | ICD-10-CM | POA: Diagnosis not present

## 2018-05-08 MED ORDER — TOBRAMYCIN 0.3 % OP SOLN: 2.0000 [drp] | OPHTHALMIC | 0 refills | Status: DC

## 2018-05-08 NOTE — Progress Notes (Signed)
This visit type was conducted due to national recommendations for restrictions regarding the COVID-19 pandemic in an effort to limit this patient's exposure and mitigate transmission in our community.   Virtual Visit via Video Note  I connected with Gary Yates on 05/08/18 at  2:30 PM EDT by a video enabled telemedicine application and verified that I am speaking with the correct person using two identifiers.  Location patient: home Location provider:work or home office Persons participating in the virtual visit: patient, provider  I discussed the limitations of evaluation and management by telemedicine and the availability of in person appointments. The patient expressed understanding and agreed to proceed.   HPI: Patient has multiple chronic problems including history of atrial fibrillation, hypertension, CAD, hypertension, COPD, obstructive sleep apnea, GERD, type 2 diabetes, non-Hodgkin's lymphoma, chronic kidney disease, restless leg syndrome.  We discussed the following issues today  Acute new problem of redness and some mild drainage from the right eye.  First occurred couple days ago.  No eye injury.  No blurred vision.  No eye pain.  Using warm compresses several times daily.  No contact use.  Patient is on chronic anticoagulation with Coumadin.  He had recent INR 3.7.  Adjustments have been made in Coumadin dosing.  Ongoing issues with restless leg syndrome.  He is on fairly high-dose Mirapex and also takes gabapentin.  Tries to avoid late the use of caffeine.  He states that sometimes taking 2 Tylenol seems to help.  He does also have history of diabetic peripheral neuropathy but current symptoms sound more compatible with restless leg syndrome  Type 2 diabetes.  Previous poor control though improved recently on insulin.  Fasting blood sugar yesterday 124.  Does not monitor regularly.  No hypoglycemia.   ROS: See pertinent positives and negatives per HPI.  Past Medical History:   Diagnosis Date  . Anemia in chronic renal disease 05/07/2017  . Anxiety   . Atrial fibrillation ([place])   . COPD (chronic obstructive pulmonary disease) ([place])    pt. denies  . Coronary artery disease    a. h/o Overlapping stents RCA;  b. 06/2011 Cath: patent stents, nonobs dzs, NL EF.  . Diabetic peripheral neuropathy ([place])   . Diffuse non-Hodgkin's lymphoma of testis ([place]) 09/28/2015  . DM (diabetes mellitus) ([place])    Type 2, peripheral neuropathy.  . Dyspnea    with exertion  . Dysrhythmia   . GERD (gastroesophageal reflux disease)   . Headache   . History of bronchitis   . History of kidney stones   . History of radiation therapy 02/19/16 - 03/13/16   Testis/Scrotum: 32.4 Gy in 18 fractions  . History of radiation therapy 08/07/16-08/20/16   left adrenal gland mass treated to 30 Gy in 10 fractions  . Hyperlipidemia   . Hypertension   . Iron deficiency anemia due to chronic blood loss 08/08/2017  . Low testosterone   . Nephrolithiasis   . OSA (obstructive sleep apnea) 11/26/2017  . Osteoarthritis    shoulder  . Restless leg   . SVT (supraventricular tachycardia) ([place])   . Urinary frequency   . Wears partial dentures    upper and lower    Past Surgical History:  Procedure Laterality Date  . APPENDECTOMY    . [place]  . CARDIAC CATHETERIZATION  01/2013  . CATARACT EXTRACTION, BILATERAL    . CHOLECYSTECTOMY    . COLONOSCOPY    . CORONARY ANGIOPLASTY  2004  . CYSTOSCOPY N/A  08/18/2017   Procedure: CYSTOSCOPY WITH FULGURATION AND SUPRA PUBIC TUBE PLACEMENT;  Surgeon: Kathie Rhodes, MD;  Location: [facility];  Service: Urology;  Laterality: N/A;  . EYE SURGERY Bilateral    cataracts  . IR GENERIC HISTORICAL  10/05/2015   IR US GUIDE VASC ACCESS RIGHT 10/05/2015 Marybelle Killings, MD [facility]  . IR GENERIC HISTORICAL  10/05/2015   IR FLUORO GUIDE PORT INSERTION RIGHT 10/05/2015 Marybelle Killings, MD [facility]  . LEFT HEART CATHETERIZATION WITH CORONARY ANGIOGRAM N/A  06/18/2011   Procedure: LEFT HEART CATHETERIZATION WITH CORONARY ANGIOGRAM;  Surgeon: Peter M Martinique, MD;  Location: [facility] CATH LAB;  Service: Cardiovascular;  Laterality: N/A;  . LEFT HEART CATHETERIZATION WITH CORONARY ANGIOGRAM N/A 01/27/2013   Procedure: LEFT HEART CATHETERIZATION WITH CORONARY ANGIOGRAM;  Surgeon: Burnell Blanks, MD;  Location: [facility] CATH LAB;  Service: Cardiovascular;  Laterality: N/A;  . LUMBAR LAMINECTOMY/DECOMPRESSION MICRODISCECTOMY N/A 02/07/2015   Procedure: Lumbar three-Sacral one Decompression;  Surgeon: Kevan Ny Ditty, MD;  Location: [place] NEURO ORS;  Service: Neurosurgery;  Laterality: N/A;  L3 to S1 Decompression  . MULTIPLE TOOTH EXTRACTIONS    . ORCHIECTOMY Right 09/01/2015   Procedure: RIGHT ORCHIECTOMY;  Surgeon: Kathie Rhodes, MD;  Location: [facility];  Service: Urology;  Laterality: Right;  . port a cath in place     . ROTATOR CUFF REPAIR Left     Family History  Problem Relation Age of Onset  . Alzheimer's disease Mother   . Heart disease Mother   . Heart disease Father   . Migraines Father   . Ulcers Father   . Prostate cancer Brother   . Coronary artery disease Other        Male 1st degree relative <50  . Coronary artery disease Other        male 1st degree relative <60  . Heart disease Sister   . Obesity Sister        Morbid  . Arthritis Sister   . Heart disease Brother   . Arthritis Brother   . Sleep apnea Son   . Obesity Son   . Migraines Daughter   . Thyroid disease Daughter     SOCIAL HX: Married.  Quit smoking 1980   Current Outpatient Medications:  .  ACCU-CHEK AVIVA PLUS test strip, TEST 3 TIMES A DAY (Patient taking differently: TEST 2 TIMES A DAY), Disp: 300 each, Rfl: 5 .  acetaminophen (TYLENOL) 500 MG tablet, Take 1,000 mg by mouth every 6 (six) hours as needed (for pain/fever/headaches.). , Disp: , Rfl:  .  atorvastatin (LIPITOR) 40 MG tablet, TAKE 1 TABLET BY MOUTH EVERY DAY, Disp: 30 tablet, Rfl: 2 .  azithromycin  (ZITHROMAX Z-PAK) 250 MG tablet, Take as directed, Disp: 6 each, Rfl: 0 .  docusate sodium (COLACE) 50 MG capsule, Take 1 capsule (50 mg total) by mouth 2 (two) times daily as needed (for constipation)., Disp: 90 capsule, Rfl: 0 .  furosemide (LASIX) 40 MG tablet, Take one tablet by mouth every other day., Disp: 30 tablet, Rfl: 0 .  gabapentin (NEURONTIN) 300 MG capsule, TAKE 2 CAPSULES BY MOUTH 3 TIMES A DAY, Disp: 540 capsule, Rfl: 3 .  KLOR-CON M20 20 MEQ tablet, TAKE 1 TABLET BY MOUTH EVERY DAY, Disp: 30 tablet, Rfl: 2 .  magic mouthwash w/lidocaine SOLN, Take 5 mLs by mouth 4 (four) times daily as needed for mouth pain. 1:1 ratio. DIPHENHYDRAMINE HCL (ANTIHISTAMINES - ETHANOLAMINES),ALUM & MAG HYDROXIDE-SIMETH,NYSTATIN (ANTI-INFECTIVES - THROAT),LIDOCAINE HCL (ANESTHETICS TOPICAL  ORAL), Disp: 600 mL, Rfl: 3 .  Melatonin 1 MG TABS, Take 1 tablet by mouth at bedtime. , Disp: , Rfl:  .  metFORMIN (GLUCOPHAGE) 500 MG tablet, TAKE 1 TABLET BY MOUTH TWICE A DAY WITH A MEAL, Disp: 180 tablet, Rfl: 2 .  metoprolol succinate (TOPROL XL) 25 MG 24 hr tablet, Take 1 tablet (25 mg total) by mouth daily., Disp: 30 tablet, Rfl: 11 .  Multiple Vitamins-Minerals (MULTIVITAMIN WITH MINERALS) tablet, Take 1 tablet by mouth daily., Disp: , Rfl:  .  nitroGLYCERIN (NITROSTAT) 0.4 MG SL tablet, Place 1 tablet (0.4 mg total) under the tongue every 5 (five) minutes as needed. Chest pain, Disp: 25 tablet, Rfl: 6 .  nystatin (MYCOSTATIN/NYSTOP) powder, APPLY TO AFFECTED AREA 4 TIMES A DAY, Disp: 60 g, Rfl: 0 .  nystatin-triamcinolone ointment (MYCOLOG), Apply 1 application topically 2 (two) times daily., Disp: 15 g, Rfl: 0 .  pantoprazole (PROTONIX) 40 MG tablet, TAKE 1 TABLET BY MOUTH EVERY DAY, Disp: 90 tablet, Rfl: 3 .  polyethylene glycol (MIRALAX / GLYCOLAX) packet, Take 17 g by mouth daily., Disp: 14 each, Rfl: 0 .  pramipexole (MIRAPEX) 1.5 MG tablet, Take 1 tablet (1.5 mg total) by mouth 2 (two) times daily., Disp:  60 tablet, Rfl: 5 .  tobramycin (TOBREX) 0.3 % ophthalmic solution, Place 2 drops into the right eye every 4 (four) hours., Disp: 5 mL, Rfl: 0 .  TOUJEO SOLOSTAR 300 UNIT/ML SOPN, INJECT 20 UNITS INTO THE SKIN DAILY AFTER BREAKFAST, Disp: 4.5 pen, Rfl: 2 .  vitamin B-12 (CYANOCOBALAMIN) 1000 MCG tablet, Take 1 tablet (1,000 mcg total) by mouth daily., Disp: 30 tablet, Rfl: 0 .  warfarin (COUMADIN) 5 MG tablet, Take 1 1/2 tablets daily except take 1 tablet on Monday Wed Friday., Disp: 45 tablet, Rfl: 3 No current facility-administered medications for this visit.   Facility-Administered Medications Ordered in Other Visits:  .  acetaminophen (TYLENOL) tablet 650 mg, 650 mg, Oral, Once, [place], Sarah M, NP .  pegfilgrastim (NEULASTA ONPRO KIT) injection 6 mg, 6 mg, Subcutaneous, Once, [place], Peter R, MD .  sodium chloride flush (NS) 0.9 % injection 10 mL, 10 mL, Intracatheter, PRN, Volanda Napoleon, MD, 10 mL at 02/27/17 1503 .  sodium chloride flush (NS) 0.9 % injection 10 mL, 10 mL, Intravenous, PRN, Volanda Napoleon, MD, 10 mL at 05/07/17 3976  EXAM:  VITALS per patient if applicable:  GENERAL: alert, oriented, appears well and in no acute distress  HEENT: atraumatic, conjunttiva clear, no obvious abnormalities on inspection of external nose and ears Patient does have some visible redness of the right conjunctivo-compared to the left.  He was able to pull down the eye to look at the conjunctival region.  NECK: normal movements of the head and neck  LUNGS: on inspection no signs of respiratory distress, breathing rate appears normal, no obvious gross SOB, gasping or wheezing  CV: no obvious cyanosis  MS: moves all visible extremities without noticeable abnormality  PSYCH/NEURO: pleasant and cooperative, no obvious depression or anxiety, speech and thought processing grossly intact  ASSESSMENT AND PLAN:  Discussed the following assessment and plan:  #1 probable bacterial  conjunctivitis right eye.  We are limited by video exam today.  He does not have any eye pain or blurred vision or other red flags for acute worrisome problem  #2 restless leg syndrome.  Patient has had very difficult to control restless leg symptoms -Continue Mirapex -Continue gabapentin -Would be reluctant to add Klonopin  because of increased risk of falls  #3 type 2 diabetes.  A1c 6.4% in January.  He has order placed for future lab for repeat A1c     I discussed the assessment and treatment plan with the patient. The patient was provided an opportunity to ask questions and all were answered. The patient agreed with the plan and demonstrated an understanding of the instructions.   The patient was advised to call back or seek an in-person evaluation if the symptoms worsen or if the condition fails to improve as anticipated.    Carolann Littler, MD

## 2018-05-15 ENCOUNTER — Telehealth: Payer: Self-pay | Admitting: Family Medicine

## 2018-05-15 NOTE — Telephone Encounter (Signed)
He needs follow up A1C and TSH- already has future order in Epic.

## 2018-05-15 NOTE — Telephone Encounter (Signed)
Kathlee Nations, RN with [facility] called the report of INR 2.4. She says she's with the patient and the patient mentioned that Dr. Elease Hashimoto wanted some labs done, so she is asking a call back to let her know what labs need to be done and what to do about the next INR check. Kathlee Nations, RN CB# [phone].

## 2018-05-15 NOTE — Telephone Encounter (Signed)
HHRN Kathlee Nations  from [facility] called regarding the results of the patient's INR of 2.4.  She wants to know if the patient's dosage of Coumadin be changed and she also wants to know the next time his INR needs to be checked. Notified Dr. Glori Bickers (on call provider) and conference her with the [facility], Kathlee Nations, for orders.

## 2018-05-15 NOTE — Telephone Encounter (Signed)
Please see message. °

## 2018-05-18 LAB — POCT INR: INR: 2.4 (ref 2.0–3.0)

## 2018-05-18 NOTE — Telephone Encounter (Signed)
Please see message.  Please advise. 

## 2018-05-18 NOTE — Telephone Encounter (Signed)
His protimes/coumadin are managed by [facility] with Coumadin clinic.

## 2018-05-18 NOTE — Telephone Encounter (Signed)
Please see new message

## 2018-05-18 NOTE — Telephone Encounter (Signed)
Called Kathlee Nations RN and LMOVM to return call  [place] for [facility] to Discuss results / PCP / recommendations / Schedule patient  Per Dr. Elease Hashimoto patient needs a Hemoglobin A1c and TSH for labs, have orders in Epic, not sure if I need to fax these orders to her.  CRM Created.

## 2018-05-19 ENCOUNTER — Ambulatory Visit (INDEPENDENT_AMBULATORY_CARE_PROVIDER_SITE_OTHER): Payer: Medicare Other | Admitting: General Practice

## 2018-05-19 DIAGNOSIS — Z7901 Long term (current) use of anticoagulants: Secondary | ICD-10-CM

## 2018-05-19 DIAGNOSIS — I480 Paroxysmal atrial fibrillation: Secondary | ICD-10-CM

## 2018-05-19 NOTE — Patient Instructions (Addendum)
Pre visit review using our clinic review tool, if applicable. No additional management support is needed unless otherwise documented below in the visit note.  Continue to take  7.5 daily except 5 mg on Monday/Wed/Fridays/Saturdays.   Dosing instructions given to Kathlee Nations, RN @ [facility]  [phone].   Re-check in 2 weeks.

## 2018-05-21 ENCOUNTER — Ambulatory Visit: Payer: Medicare Other

## 2018-05-21 ENCOUNTER — Other Ambulatory Visit: Payer: Medicare Other

## 2018-05-21 ENCOUNTER — Ambulatory Visit: Payer: Medicare Other | Admitting: Hematology & Oncology

## 2018-05-22 ENCOUNTER — Inpatient Hospital Stay: Payer: Medicare Other

## 2018-05-22 ENCOUNTER — Telehealth: Payer: Self-pay | Admitting: Hematology & Oncology

## 2018-05-22 ENCOUNTER — Other Ambulatory Visit: Payer: Self-pay

## 2018-05-22 ENCOUNTER — Inpatient Hospital Stay: Payer: Medicare Other | Attending: Hematology & Oncology

## 2018-05-22 ENCOUNTER — Inpatient Hospital Stay (HOSPITAL_BASED_OUTPATIENT_CLINIC_OR_DEPARTMENT_OTHER): Payer: Medicare Other | Admitting: Hematology & Oncology

## 2018-05-22 ENCOUNTER — Encounter: Payer: Self-pay | Admitting: Hematology & Oncology

## 2018-05-22 VITALS — BP 126/72 | HR 85 | Temp 98.6°F | Resp 16

## 2018-05-22 DIAGNOSIS — Z9225 Personal history of immunosupression therapy: Secondary | ICD-10-CM

## 2018-05-22 DIAGNOSIS — R634 Abnormal weight loss: Secondary | ICD-10-CM | POA: Insufficient documentation

## 2018-05-22 DIAGNOSIS — R600 Localized edema: Secondary | ICD-10-CM

## 2018-05-22 DIAGNOSIS — D631 Anemia in chronic kidney disease: Secondary | ICD-10-CM

## 2018-05-22 DIAGNOSIS — Z7901 Long term (current) use of anticoagulants: Secondary | ICD-10-CM | POA: Diagnosis not present

## 2018-05-22 DIAGNOSIS — M549 Dorsalgia, unspecified: Secondary | ICD-10-CM | POA: Insufficient documentation

## 2018-05-22 DIAGNOSIS — N289 Disorder of kidney and ureter, unspecified: Secondary | ICD-10-CM | POA: Diagnosis not present

## 2018-05-22 DIAGNOSIS — E500 Vitamin A deficiency with conjunctival xerosis: Secondary | ICD-10-CM

## 2018-05-22 DIAGNOSIS — C8589 Other specified types of non-Hodgkin lymphoma, extranodal and solid organ sites: Secondary | ICD-10-CM | POA: Diagnosis not present

## 2018-05-22 DIAGNOSIS — J3489 Other specified disorders of nose and nasal sinuses: Secondary | ICD-10-CM | POA: Diagnosis not present

## 2018-05-22 DIAGNOSIS — R531 Weakness: Secondary | ICD-10-CM | POA: Diagnosis not present

## 2018-05-22 DIAGNOSIS — E1165 Type 2 diabetes mellitus with hyperglycemia: Secondary | ICD-10-CM

## 2018-05-22 DIAGNOSIS — Z923 Personal history of irradiation: Secondary | ICD-10-CM | POA: Diagnosis not present

## 2018-05-22 DIAGNOSIS — Z7984 Long term (current) use of oral hypoglycemic drugs: Secondary | ICD-10-CM | POA: Insufficient documentation

## 2018-05-22 DIAGNOSIS — E119 Type 2 diabetes mellitus without complications: Secondary | ICD-10-CM | POA: Insufficient documentation

## 2018-05-22 DIAGNOSIS — Z79899 Other long term (current) drug therapy: Secondary | ICD-10-CM | POA: Insufficient documentation

## 2018-05-22 DIAGNOSIS — D500 Iron deficiency anemia secondary to blood loss (chronic): Secondary | ICD-10-CM

## 2018-05-22 DIAGNOSIS — N184 Chronic kidney disease, stage 4 (severe): Secondary | ICD-10-CM

## 2018-05-22 DIAGNOSIS — Z9221 Personal history of antineoplastic chemotherapy: Secondary | ICD-10-CM | POA: Diagnosis not present

## 2018-05-22 DIAGNOSIS — E1140 Type 2 diabetes mellitus with diabetic neuropathy, unspecified: Secondary | ICD-10-CM

## 2018-05-22 DIAGNOSIS — R7989 Other specified abnormal findings of blood chemistry: Secondary | ICD-10-CM

## 2018-05-22 LAB — CBC WITH DIFFERENTIAL (CANCER CENTER ONLY)
Abs Immature Granulocytes: 0.03 10*3/uL (ref 0.00–0.07)
Basophils Absolute: 0 10*3/uL (ref 0.0–0.1)
Basophils Relative: 1 %
Eosinophils Absolute: 0.1 10*3/uL (ref 0.0–0.5)
Eosinophils Relative: 2 %
HCT: 30.7 % — ABNORMAL LOW (ref 39.0–52.0)
Hemoglobin: 10.4 g/dL — ABNORMAL LOW (ref 13.0–17.0)
Immature Granulocytes: 1 %
Lymphocytes Relative: 22 %
Lymphs Abs: 1.4 10*3/uL (ref 0.7–4.0)
MCH: 33.8 pg (ref 26.0–34.0)
MCHC: 33.9 g/dL (ref 30.0–36.0)
MCV: 99.7 fL (ref 80.0–100.0)
Monocytes Absolute: 0.6 10*3/uL (ref 0.1–1.0)
Monocytes Relative: 9 %
Neutro Abs: 4.4 10*3/uL (ref 1.7–7.7)
Neutrophils Relative %: 65 %
Platelet Count: 135 10*3/uL — ABNORMAL LOW (ref 150–400)
RBC: 3.08 MIL/uL — ABNORMAL LOW (ref 4.22–5.81)
RDW: 13.4 % (ref 11.5–15.5)
WBC Count: 6.6 10*3/uL (ref 4.0–10.5)
nRBC: 0 % (ref 0.0–0.2)

## 2018-05-22 LAB — CMP (CANCER CENTER ONLY)
ALT: 7 U/L (ref 0–44)
AST: 17 U/L (ref 15–41)
Albumin: 3.9 g/dL (ref 3.5–5.0)
Alkaline Phosphatase: 73 U/L (ref 38–126)
Anion gap: 11 (ref 5–15)
BUN: 21 mg/dL (ref 8–23)
CO2: 27 mmol/L (ref 22–32)
Calcium: 9 mg/dL (ref 8.9–10.3)
Chloride: 99 mmol/L (ref 98–111)
Creatinine: 1.33 mg/dL — ABNORMAL HIGH (ref 0.61–1.24)
GFR, Est AFR Am: 57 mL/min — ABNORMAL LOW (ref >60)
GFR, Estimated: 49 mL/min — ABNORMAL LOW (ref >60)
Glucose, Bld: 122 mg/dL — ABNORMAL HIGH (ref 70–99)
Potassium: 4 mmol/L (ref 3.5–5.1)
Sodium: 137 mmol/L (ref 135–145)
Total Bilirubin: 0.7 mg/dL (ref 0.3–1.2)
Total Protein: 6.6 g/dL (ref 6.5–8.1)

## 2018-05-22 LAB — LACTATE DEHYDROGENASE: LDH: 200 U/L — ABNORMAL HIGH (ref 98–192)

## 2018-05-22 LAB — HEMOGLOBIN A1C
Hgb A1c MFr Bld: 6.2 % — ABNORMAL HIGH (ref 4.8–5.6)
Mean Plasma Glucose: 131.24 mg/dL

## 2018-05-22 LAB — RETICULOCYTES
Immature Retic Fract: 18.3 % — ABNORMAL HIGH (ref 2.3–15.9)
RBC.: 3.1 MIL/uL — ABNORMAL LOW (ref 4.22–5.81)
Retic Count, Absolute: 53.9 10*3/uL (ref 19.0–186.0)
Retic Ct Pct: 1.7 % (ref 0.4–3.1)

## 2018-05-22 MED ORDER — HEPARIN SOD (PORK) LOCK FLUSH 100 UNIT/ML IV SOLN
500.0000 [IU] | Freq: Once | INTRAVENOUS | Status: AC | PRN
  Administered 2018-05-22: 500 [IU]
  Filled 2018-05-22: qty 5

## 2018-05-22 MED ORDER — SODIUM CHLORIDE 0.9% FLUSH
10.0000 mL | Freq: Once | INTRAVENOUS | Status: AC | PRN
  Administered 2018-05-22: 10 mL
  Filled 2018-05-22: qty 10

## 2018-05-22 NOTE — Patient Instructions (Signed)

## 2018-05-22 NOTE — Progress Notes (Signed)
Hematology and Oncology Follow Up Visit  Attilio Zeitler [id number] 12-08-1933 85 y.o. 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 Milstein is back for follow-up.  He actually is doing quite well.  He says he feels pretty good.  He is losing weight.  Hopefully, this is from his diuretic protocol.  Despite the coronavirus, he really has not had much in the way of difficulties.  He does have chronic back issues.  He says this has not caused any further problems for him.  He has heart issues.  He is on Coumadin.  He has had no bleeding.  He has had no issues with heart failure.  There is been no nausea or vomiting.  He has chronic leg swelling.  He does not sleep all that well.  His last PET scan was done back in November 2019.  This did not show any evidence of recurrent lymphoma.  Overall, his performance status is ECOG 2-3.    Medications:  Current Outpatient Medications:  .  ACCU-CHEK AVIVA PLUS test strip, TEST 3 TIMES A DAY (Patient taking differently: TEST 2 TIMES A DAY), Disp: 300 each, Rfl: 5 .  acetaminophen (TYLENOL) 500 MG tablet, Take 1,000 mg by mouth every 6 (six) hours as needed (for pain/fever/headaches.). , Disp: , Rfl:  .  atorvastatin (LIPITOR) 40 MG tablet, TAKE 1 TABLET BY MOUTH EVERY DAY, Disp: 30 tablet, Rfl: 2 .  azithromycin (ZITHROMAX Z-PAK) 250 MG tablet, Take as directed, Disp: 6 each, Rfl: 0 .  docusate sodium (COLACE) 50 MG capsule, Take 1 capsule (50 mg total) by mouth 2 (two) times  daily as needed (for constipation)., Disp: 90 capsule, Rfl: 0 .  furosemide (LASIX) 40 MG tablet, Take one tablet by mouth every other day., Disp: 30 tablet, Rfl: 0 .  gabapentin (NEURONTIN) 300 MG capsule, TAKE 2 CAPSULES BY MOUTH 3 TIMES A DAY, Disp: 540 capsule, Rfl: 3 .  KLOR-CON M20 20 MEQ tablet, TAKE 1 TABLET BY MOUTH EVERY DAY, Disp: 30 tablet, Rfl: 2 .  magic mouthwash w/lidocaine SOLN, Take 5 mLs by mouth 4 (four) times daily as needed for mouth pain. 1:1 ratio. DIPHENHYDRAMINE HCL (ANTIHISTAMINES - ETHANOLAMINES),ALUM & MAG HYDROXIDE-SIMETH,NYSTATIN (ANTI-INFECTIVES - THROAT),LIDOCAINE HCL (ANESTHETICS TOPICAL ORAL), Disp: 600 mL, Rfl: 3 .  Melatonin 1 MG TABS, Take 1 tablet by mouth at bedtime. , Disp: , Rfl:  .  metFORMIN (GLUCOPHAGE) 500 MG tablet, TAKE 1 TABLET BY MOUTH TWICE A DAY WITH A MEAL, Disp: 180 tablet, Rfl: 2 .  metoprolol succinate (TOPROL XL) 25 MG 24 hr tablet, Take 1 tablet (25 mg total) by mouth daily., Disp: 30 tablet, Rfl: 11 .  Multiple Vitamins-Minerals (MULTIVITAMIN WITH MINERALS) tablet, Take 1 tablet by mouth daily., Disp: , Rfl:  .  nitroGLYCERIN (NITROSTAT) 0.4 MG SL tablet, Place 1 tablet (0.4 mg total) under the tongue every 5 (five) minutes as needed. Chest  pain, Disp: 25 tablet, Rfl: 6 .  nystatin (MYCOSTATIN/NYSTOP) powder, APPLY TO AFFECTED AREA 4 TIMES A DAY, Disp: 60 g, Rfl: 0 .  nystatin-triamcinolone ointment (MYCOLOG), Apply 1 application topically 2 (two) times daily., Disp: 15 g, Rfl: 0 .  pantoprazole (PROTONIX) 40 MG tablet, TAKE 1 TABLET BY MOUTH EVERY DAY, Disp: 90 tablet, Rfl: 3 .  polyethylene glycol (MIRALAX / GLYCOLAX) packet, Take 17 g by mouth daily., Disp: 14 each, Rfl: 0 .  pramipexole (MIRAPEX) 1.5 MG tablet, Take 1 tablet (1.5 mg total) by mouth 2 (two) times daily., Disp: 60 tablet, Rfl: 5 .  tobramycin (TOBREX) 0.3 % ophthalmic solution, Place 2 drops into the right eye every 4 (four) hours., Disp: 5 mL, Rfl: 0 .  TOUJEO SOLOSTAR 300  UNIT/ML SOPN, INJECT 20 UNITS INTO THE SKIN DAILY AFTER BREAKFAST, Disp: 4.5 pen, Rfl: 2 .  vitamin B-12 (CYANOCOBALAMIN) 1000 MCG tablet, Take 1 tablet (1,000 mcg total) by mouth daily., Disp: 30 tablet, Rfl: 0 .  warfarin (COUMADIN) 5 MG tablet, Take 1 1/2 tablets daily except take 1 tablet on Monday Wed Friday., Disp: 45 tablet, Rfl: 3 No current facility-administered medications for this visit.   Facility-Administered Medications Ordered in Other Visits:  .  acetaminophen (TYLENOL) tablet 650 mg, 650 mg, Oral, Once, [place], Sarah M, NP .  pegfilgrastim (NEULASTA ONPRO KIT) injection 6 mg, 6 mg, Subcutaneous, Once, Jonpaul Lumm R, MD .  sodium chloride flush (NS) 0.9 % injection 10 mL, 10 mL, Intracatheter, PRN, Volanda Napoleon, MD, 10 mL at 02/27/17 1503 .  sodium chloride flush (NS) 0.9 % injection 10 mL, 10 mL, Intravenous, PRN, Volanda Napoleon, MD, 10 mL at 05/07/17 3664  Allergies:  Allergies  Allergen Reactions  . Ace Inhibitors Other (See Comments)    cough  . Codeine Nausea Only and Rash       . Penicillins Rash    Childhood allergy Has patient had a PCN reaction causing immediate rash, facial/tongue/throat swelling, SOB or lightheadedness with hypotension: Yes Has patient had a PCN reaction causing severe rash involving mucus membranes or skin necrosis: Yes Has patient had a PCN reaction that required hospitalization No Has patient had a PCN reaction occurring within the last 10 years: No If all of the above answers are "NO", then may proceed with Cephalosporin use.     Past Medical History, Surgical history, Social history, and Family History were reviewed and updated.  Review of Systems: Review of Systems  HENT: Positive for sinus pain and sore throat.   Eyes: Positive for blurred vision.  Respiratory: Positive for wheezing.   Cardiovascular: Positive for leg swelling.  Gastrointestinal: Negative.   Genitourinary: Negative.   Musculoskeletal: Positive for  back pain.  Skin: Negative.   Neurological: Positive for weakness.  Endo/Heme/Allergies: Negative.   Psychiatric/Behavioral: Negative.     Physical Exam:  oral temperature is 98.6 F (37 C). His blood pressure is 126/72 and his pulse is 85. His respiration is 16 and oxygen saturation is 98%.   Wt Readings from Last 3 Encounters:  04/08/18 225 lb (102.1 kg)  03/23/18 228 lb 9.6 oz (103.7 kg)  01/23/18 223 lb 11.2 oz (101.5 kg)      Physical Exam Vitals signs reviewed.  HENT:     Head: Normocephalic and atraumatic.  Eyes:     Pupils: Pupils are equal, round, and reactive to light.  Neck:     Musculoskeletal: Normal range of motion.  Cardiovascular:  Rate and Rhythm: Normal rate and regular rhythm.     Heart sounds: Normal heart sounds.  Pulmonary:     Effort: Pulmonary effort is normal.     Breath sounds: Normal breath sounds.  Abdominal:     General: Bowel sounds are normal.     Palpations: Abdomen is soft.  Musculoskeletal: Normal range of motion.        General: No tenderness or deformity.  Lymphadenopathy:     Cervical: No cervical adenopathy.  Skin:    General: Skin is warm and dry.     Findings: No erythema or rash.  Neurological:     Mental Status: He is alert and oriented to person, place, and time.  Psychiatric:        Behavior: Behavior normal.        Thought Content: Thought content normal.        Judgment: Judgment normal.      Lab Results  Component Value Date   WBC 6.6 05/22/2018   HGB 10.4 (L) 05/22/2018   HCT 30.7 (L) 05/22/2018   MCV 99.7 05/22/2018   PLT 135 (L) 05/22/2018     Chemistry      Component Value Date/Time   NA 140 04/08/2018 1050   NA 141 01/10/2017 1136   NA 137 12/14/2015 1100   K 5.1 04/08/2018 1050   K 4.4 01/10/2017 1136   K 3.7 12/14/2015 1100   CL 105 04/08/2018 1050   CL 104 01/10/2017 1136   CO2 27 04/08/2018 1050   CO2 27 01/10/2017 1136   CO2 24 12/14/2015 1100   BUN 12 04/08/2018 1050   BUN 19  01/10/2017 1136   BUN 18.4 12/14/2015 1100   CREATININE 1.14 04/08/2018 1050   CREATININE 1.1 01/10/2017 1136   CREATININE 0.9 12/14/2015 1100      Component Value Date/Time   CALCIUM 9.0 04/08/2018 1050   CALCIUM 9.4 01/10/2017 1136   CALCIUM 9.5 12/14/2015 1100   ALKPHOS 87 04/08/2018 1050   ALKPHOS 112 (H) 01/10/2017 1136   ALKPHOS 102 12/14/2015 1100   AST 18 04/08/2018 1050   AST 22 12/14/2015 1100   ALT 8 04/08/2018 1050   ALT 18 01/10/2017 1136   ALT 10 12/14/2015 1100   BILITOT 0.3 04/08/2018 1050   BILITOT 0.58 12/14/2015 1100      Impression and Plan: Mr. Stanislaw is an 85 year old white male. He has relapsed large cell non-Hodgkin lymphoma of the testicle. He was on salvage chemotherapy with Rituxan/bendamustine/Velcade.  He did not really respond well to this.  We then treated him with dose reduced R-ICE.  He responded very well to this.  His last PET scan showed that he was in remission.    Today, we did not have to give him any Aranesp.  We will see what his iron levels are.  I think he did get some IV iron back in January.  This worked very very nicely.  I will see him back in 8 weeks.  I want to make sure we get him back to make sure his blood counts do not drop too much.  He does have a low erythropoietin level.  We will see him back, we will flush his port a patch.     Volanda Napoleon, MD 5/15/20201:12 PM

## 2018-05-22 NOTE — Telephone Encounter (Signed)
sw pt to confirm 7/14 appt at 1130 am per 5/15 LOS

## 2018-05-25 LAB — TSH: TSH: 4.457 u[IU]/mL — ABNORMAL HIGH (ref 0.320–4.118)

## 2018-05-25 LAB — IRON AND TIBC
Iron: 76 ug/dL (ref 42–163)
Saturation Ratios: 36 % (ref 20–55)
TIBC: 213 ug/dL (ref 202–409)
UIBC: 136 ug/dL (ref 117–376)

## 2018-05-25 LAB — FERRITIN: Ferritin: 693 ng/mL — ABNORMAL HIGH (ref 24–336)

## 2018-05-27 ENCOUNTER — Telehealth: Payer: Self-pay | Admitting: *Deleted

## 2018-05-27 NOTE — Telephone Encounter (Signed)
Copied from [place] [id number]. Topic: General - Inquiry >> May 27, 2018 10:25 AM Rainey Pines A wrote: Vikrant Pryce patients wife called and would like a callback from Dr. Anastasio Auerbach nurse in regards to patients thyroid reading of 4.1

## 2018-05-28 NOTE — Telephone Encounter (Signed)
Gary Yates returning your call, aware you will call back

## 2018-05-28 NOTE — Telephone Encounter (Signed)
This is close enough to normal that would not recommend any replacement therapy or changes at this time.  May offer Doxy if further questions.

## 2018-05-28 NOTE — Telephone Encounter (Signed)
I called Inez Catalina at [phone] and the voice mail was full and I could not leave a message.  Can you offer any advise about this number or recent labs from Dr. Marin Olp done on 05/22/18?

## 2018-05-28 NOTE — Telephone Encounter (Signed)
Called patient on [phone] and gave him message from Dr. Elease Hashimoto. Patient stated that he does not sleep well and his ankles swell a times and he states that he will call us back if he feels like he needs a telephone appointment.

## 2018-06-03 ENCOUNTER — Ambulatory Visit (INDEPENDENT_AMBULATORY_CARE_PROVIDER_SITE_OTHER): Payer: Medicare Other | Admitting: General Practice

## 2018-06-03 DIAGNOSIS — Z7901 Long term (current) use of anticoagulants: Secondary | ICD-10-CM

## 2018-06-03 DIAGNOSIS — I480 Paroxysmal atrial fibrillation: Secondary | ICD-10-CM

## 2018-06-03 LAB — POCT INR: INR: 2.8 (ref 2.0–3.0)

## 2018-06-03 NOTE — Patient Instructions (Signed)
Pre visit review using our clinic review tool, if applicable. No additional management support is needed unless otherwise documented below in the visit note.  Continue to take  7.5 daily except 5 mg on Monday/Wed/Fridays/Saturdays.   Dosing instructions given to Olivia Mackie, RN @ [facility]  [phone].   Re-check in 2 weeks.

## 2018-06-04 ENCOUNTER — Telehealth: Payer: Self-pay | Admitting: Family Medicine

## 2018-06-04 NOTE — Telephone Encounter (Signed)
Patient called and says his thyroid level and A1c was done at the [facility]. He asks are they high and says his children are concerned with his thyroid level being high and wants to know what Dr. Elease Hashimoto wants to do about it. I advised of the lab results and advised I will send this over to Dr. Elease Hashimoto and someone will call back with his recommendation.

## 2018-06-04 NOTE — Telephone Encounter (Signed)
Please see message. °

## 2018-06-05 ENCOUNTER — Telehealth: Payer: Self-pay

## 2018-06-05 NOTE — Telephone Encounter (Signed)
Called Gary Yates and she has set up a Doxy visit for her dad on Monday, 06/08/18 at 4:15am.

## 2018-06-05 NOTE — Telephone Encounter (Signed)
See 05-28-18 phone note   This has already been addressed.  Recommend set up Doxy so we can discuss further if they still have questions.

## 2018-06-05 NOTE — Telephone Encounter (Signed)
   Copied from [place] [id number]. Topic: General - Other >> Jun 05, 2018 12:14 PM Rainey Pines A wrote: Patients daughter Lattie Haw would like a callback from Jinny Blossom in regards to the blood work completed for patient. >> Jun 05, 2018 12:37 PM Cox, Melburn Hake, CMA wrote: There is no one named LISA listed on [employer]

## 2018-06-05 NOTE — Telephone Encounter (Signed)
I checked in [place] and I do see Lattie Haw listed in DPR note in system. Lattie Haw has set up a Doxy to go over her dads symptoms on Monday June 1st at 4:15pm.

## 2018-06-08 ENCOUNTER — Ambulatory Visit (INDEPENDENT_AMBULATORY_CARE_PROVIDER_SITE_OTHER): Payer: Medicare Other | Admitting: Family Medicine

## 2018-06-08 ENCOUNTER — Other Ambulatory Visit: Payer: Self-pay

## 2018-06-08 DIAGNOSIS — R7989 Other specified abnormal findings of blood chemistry: Secondary | ICD-10-CM | POA: Diagnosis not present

## 2018-06-08 DIAGNOSIS — G2581 Restless legs syndrome: Secondary | ICD-10-CM

## 2018-06-08 DIAGNOSIS — Z794 Long term (current) use of insulin: Secondary | ICD-10-CM

## 2018-06-08 DIAGNOSIS — E1140 Type 2 diabetes mellitus with diabetic neuropathy, unspecified: Secondary | ICD-10-CM

## 2018-06-08 NOTE — Progress Notes (Signed)
Patient ID: Gary Yates., male   DOB: 19-Sep-1933, 85 y.o.   MRN: [id number]  This visit type was conducted due to national recommendations for restrictions regarding the COVID-19 pandemic in an effort to limit this patient's exposure and mitigate transmission in our community.   Virtual Visit via Video Note  I connected with Gary Yates on 06/08/18 at  4:15 PM EDT by a video enabled telemedicine application and verified that I am speaking with the correct person using two identifiers.  Location patient: home Location provider:work or home office Persons participating in the virtual visit: patient, provider  I discussed the limitations of evaluation and management by telemedicine and the availability of in person appointments. The patient expressed understanding and agreed to proceed.   HPI: Patient had made several calls regarding recent labs.  He had hemoglobin A1c 6.2% which compares with January level of 6.4%.  He has had substantial weight loss in the past couple years which has helped with improving his diabetes.  Remains on low-dose insulin.  No recent hypoglycemia  He had several questions regarding TSH.  He had recent TSH of 4.4 and this compares with 5.3 back in January.  We had elected not to start thyroid replacement in view of his results being so close to normal and also having return closer to normal since January levels.  He continues to have restless leg symptoms.  Recent ferritin was elevated.  He does use some caffeine.  He takes fairly high-dose Mirapex and also gabapentin.  Several years ago was on Klonopin but this did not seem to help   ROS: See pertinent positives and negatives per HPI.  Past Medical History:  Diagnosis Date  . Anemia in chronic renal disease 05/07/2017  . Anxiety   . Atrial fibrillation ([place])   . COPD (chronic obstructive pulmonary disease) ([place])    pt. denies  . Coronary artery disease    a. h/o Overlapping stents RCA;  b. 06/2011 Cath: patent  stents, nonobs dzs, NL EF.  . Diabetic peripheral neuropathy ([place])   . Diffuse non-Hodgkin's lymphoma of testis ([place]) 09/28/2015  . DM (diabetes mellitus) ([place])    Type 2, peripheral neuropathy.  . Dyspnea    with exertion  . Dysrhythmia   . GERD (gastroesophageal reflux disease)   . Headache   . History of bronchitis   . History of kidney stones   . History of radiation therapy 02/19/16 - 03/13/16   Testis/Scrotum: 32.4 Gy in 18 fractions  . History of radiation therapy 08/07/16-08/20/16   left adrenal gland mass treated to 30 Gy in 10 fractions  . Hyperlipidemia   . Hypertension   . Iron deficiency anemia due to chronic blood loss 08/08/2017  . Low testosterone   . Nephrolithiasis   . OSA (obstructive sleep apnea) 11/26/2017  . Osteoarthritis    shoulder  . Restless leg   . SVT (supraventricular tachycardia) ([place])   . Urinary frequency   . Wears partial dentures    upper and lower    Past Surgical History:  Procedure Laterality Date  . APPENDECTOMY    . [place]  . CARDIAC CATHETERIZATION  01/2013  . CATARACT EXTRACTION, BILATERAL    . CHOLECYSTECTOMY    . COLONOSCOPY    . CORONARY ANGIOPLASTY  2004  . CYSTOSCOPY N/A 08/18/2017   Procedure: CYSTOSCOPY WITH FULGURATION AND SUPRA PUBIC TUBE PLACEMENT;  Surgeon: Kathie Rhodes, MD;  Location: [facility];  Service: Urology;  Laterality:  N/A;  . EYE SURGERY Bilateral    cataracts  . IR GENERIC HISTORICAL  10/05/2015   IR US GUIDE VASC ACCESS RIGHT 10/05/2015 Marybelle Killings, MD [facility]-INTER[facility]  . IR GENERIC HISTORICAL  10/05/2015   IR FLUORO GUIDE PORT INSERTION RIGHT 10/05/2015 Marybelle Killings, MD [facility]  . LEFT HEART CATHETERIZATION WITH CORONARY ANGIOGRAM N/A 06/18/2011   Procedure: LEFT HEART CATHETERIZATION WITH CORONARY ANGIOGRAM;  Surgeon: Peter M Martinique, MD;  Location: [facility] CATH LAB;  Service: Cardiovascular;  Laterality: N/A;  . LEFT HEART CATHETERIZATION WITH CORONARY ANGIOGRAM N/A 01/27/2013   Procedure: LEFT HEART  CATHETERIZATION WITH CORONARY ANGIOGRAM;  Surgeon: Burnell Blanks, MD;  Location: [facility] CATH LAB;  Service: Cardiovascular;  Laterality: N/A;  . LUMBAR LAMINECTOMY/DECOMPRESSION MICRODISCECTOMY N/A 02/07/2015   Procedure: Lumbar three-Sacral one Decompression;  Surgeon: Kevan Ny Ditty, MD;  Location: [place] NEURO ORS;  Service: Neurosurgery;  Laterality: N/A;  L3 to S1 Decompression  . MULTIPLE TOOTH EXTRACTIONS    . ORCHIECTOMY Right 09/01/2015   Procedure: RIGHT ORCHIECTOMY;  Surgeon: Kathie Rhodes, MD;  Location: [facility];  Service: Urology;  Laterality: Right;  . port a cath in place     . ROTATOR CUFF REPAIR Left     Family History  Problem Relation Age of Onset  . Alzheimer's disease Mother   . Heart disease Mother   . Heart disease Father   . Migraines Father   . Ulcers Father   . Prostate cancer Brother   . Coronary artery disease Other        Male 1st degree relative <50  . Coronary artery disease Other        male 1st degree relative <60  . Heart disease Sister   . Obesity Sister        Morbid  . Arthritis Sister   . Heart disease Brother   . Arthritis Brother   . Sleep apnea Son   . Obesity Son   . Migraines Daughter   . Thyroid disease Daughter     SOCIAL HX: Quit smoking 1980   Current Outpatient Medications:  .  ACCU-CHEK AVIVA PLUS test strip, TEST 3 TIMES A DAY (Patient taking differently: TEST 2 TIMES A DAY), Disp: 300 each, Rfl: 5 .  acetaminophen (TYLENOL) 500 MG tablet, Take 1,000 mg by mouth every 6 (six) hours as needed (for pain/fever/headaches.). , Disp: , Rfl:  .  atorvastatin (LIPITOR) 40 MG tablet, TAKE 1 TABLET BY MOUTH EVERY DAY, Disp: 30 tablet, Rfl: 2 .  azithromycin (ZITHROMAX Z-PAK) 250 MG tablet, Take as directed, Disp: 6 each, Rfl: 0 .  docusate sodium (COLACE) 50 MG capsule, Take 1 capsule (50 mg total) by mouth 2 (two) times daily as needed (for constipation)., Disp: 90 capsule, Rfl: 0 .  furosemide (LASIX) 40 MG tablet, Take one  tablet by mouth every other day., Disp: 30 tablet, Rfl: 0 .  gabapentin (NEURONTIN) 300 MG capsule, TAKE 2 CAPSULES BY MOUTH 3 TIMES A DAY, Disp: 540 capsule, Rfl: 3 .  KLOR-CON M20 20 MEQ tablet, TAKE 1 TABLET BY MOUTH EVERY DAY, Disp: 30 tablet, Rfl: 2 .  magic mouthwash w/lidocaine SOLN, Take 5 mLs by mouth 4 (four) times daily as needed for mouth pain. 1:1 ratio. DIPHENHYDRAMINE HCL (ANTIHISTAMINES - ETHANOLAMINES),ALUM & MAG HYDROXIDE-SIMETH,NYSTATIN (ANTI-INFECTIVES - THROAT),LIDOCAINE HCL (ANESTHETICS TOPICAL ORAL), Disp: 600 mL, Rfl: 3 .  Melatonin 1 MG TABS, Take 1 tablet by mouth at bedtime. , Disp: , Rfl:  .  metFORMIN (GLUCOPHAGE)  500 MG tablet, TAKE 1 TABLET BY MOUTH TWICE A DAY WITH A MEAL, Disp: 180 tablet, Rfl: 2 .  metoprolol succinate (TOPROL XL) 25 MG 24 hr tablet, Take 1 tablet (25 mg total) by mouth daily., Disp: 30 tablet, Rfl: 11 .  Multiple Vitamins-Minerals (MULTIVITAMIN WITH MINERALS) tablet, Take 1 tablet by mouth daily., Disp: , Rfl:  .  nitroGLYCERIN (NITROSTAT) 0.4 MG SL tablet, Place 1 tablet (0.4 mg total) under the tongue every 5 (five) minutes as needed. Chest pain, Disp: 25 tablet, Rfl: 6 .  nystatin (MYCOSTATIN/NYSTOP) powder, APPLY TO AFFECTED AREA 4 TIMES A DAY, Disp: 60 g, Rfl: 0 .  nystatin-triamcinolone ointment (MYCOLOG), Apply 1 application topically 2 (two) times daily., Disp: 15 g, Rfl: 0 .  pantoprazole (PROTONIX) 40 MG tablet, TAKE 1 TABLET BY MOUTH EVERY DAY, Disp: 90 tablet, Rfl: 3 .  polyethylene glycol (MIRALAX / GLYCOLAX) packet, Take 17 g by mouth daily., Disp: 14 each, Rfl: 0 .  pramipexole (MIRAPEX) 1.5 MG tablet, Take 1 tablet (1.5 mg total) by mouth 2 (two) times daily., Disp: 60 tablet, Rfl: 5 .  tobramycin (TOBREX) 0.3 % ophthalmic solution, Place 2 drops into the right eye every 4 (four) hours., Disp: 5 mL, Rfl: 0 .  TOUJEO SOLOSTAR 300 UNIT/ML SOPN, INJECT 20 UNITS INTO THE SKIN DAILY AFTER BREAKFAST, Disp: 4.5 pen, Rfl: 2 .  vitamin B-12  (CYANOCOBALAMIN) 1000 MCG tablet, Take 1 tablet (1,000 mcg total) by mouth daily., Disp: 30 tablet, Rfl: 0 .  warfarin (COUMADIN) 5 MG tablet, Take 1 1/2 tablets daily except take 1 tablet on Monday Wed Friday., Disp: 45 tablet, Rfl: 3 No current facility-administered medications for this visit.   Facility-Administered Medications Ordered in Other Visits:  .  acetaminophen (TYLENOL) tablet 650 mg, 650 mg, Oral, Once, [place], Sarah M, NP .  pegfilgrastim (NEULASTA ONPRO KIT) injection 6 mg, 6 mg, Subcutaneous, Once, Ennever, Peter R, MD .  sodium chloride flush (NS) 0.9 % injection 10 mL, 10 mL, Intracatheter, PRN, Volanda Napoleon, MD, 10 mL at 02/27/17 1503 .  sodium chloride flush (NS) 0.9 % injection 10 mL, 10 mL, Intravenous, PRN, Volanda Napoleon, MD, 10 mL at 05/07/17 0174  EXAM:  VITALS per patient if applicable:  GENERAL: alert, oriented, appears well and in no acute distress  HEENT: atraumatic, conjunttiva clear, no obvious abnormalities on inspection of external nose and ears  NECK: normal movements of the head and neck  LUNGS: on inspection no signs of respiratory distress, breathing rate appears normal, no obvious gross SOB, gasping or wheezing  CV: no obvious cyanosis  MS: moves all visible extremities without noticeable abnormality  PSYCH/NEURO: pleasant and cooperative, no obvious depression or anxiety, speech and thought processing grossly intact  ASSESSMENT AND PLAN:  Discussed the following assessment and plan:  #1 type 2 diabetes well controlled with recent A1c 6.2%  -Continue current diabetes regimen and reassess A1c in 3 months  #2 abnormal TSH.  Minimally elevated TSH 4.4  -Repeat TSH and free T4 in about 3 months  #3 restless leg symptoms  -Continue gabapentin and Mirapex -We discussed possible neurology referral to see if they any other thoughts regarding treatment but at this point he would like to wait.  We have stressed importance of reduced  caffeine intake and especially after mid afternoon    I discussed the assessment and treatment plan with the patient. The patient was provided an opportunity to ask questions and all were answered. The patient  agreed with the plan and demonstrated an understanding of the instructions.   The patient was advised to call back or seek an in-person evaluation if the symptoms worsen or if the condition fails to improve as anticipated.   Carolann Littler, MD

## 2018-06-18 ENCOUNTER — Telehealth: Payer: Self-pay

## 2018-06-18 ENCOUNTER — Other Ambulatory Visit: Payer: Self-pay | Admitting: Family Medicine

## 2018-06-18 NOTE — Telephone Encounter (Signed)
Copied from [place] [id number]. Topic: General - Other >> Jun 18, 2018  4:11 PM Celene Kras A wrote: Reason for CRM: Tranace, from [facility], called stating pts INR was 4.1 today. Please advise.

## 2018-06-19 ENCOUNTER — Ambulatory Visit (INDEPENDENT_AMBULATORY_CARE_PROVIDER_SITE_OTHER): Payer: Medicare Other | Admitting: General Practice

## 2018-06-19 DIAGNOSIS — Z7901 Long term (current) use of anticoagulants: Secondary | ICD-10-CM

## 2018-06-19 LAB — POCT INR: INR: 4.1 — AB (ref 2.0–3.0)

## 2018-06-19 NOTE — Patient Instructions (Addendum)
Pre visit review using our clinic review tool, if applicable. No additional management support is needed unless otherwise documented below in the visit note.  Hold coumadin today and tomorrow and then change dosage and take 7.5 daily except 5 mg on Monday/Wed/Fridays/Saturdays.   Dosing instructions given to Gary Jaksch, RN @ [facility]  [phone].   Re-check in 2 weeks. (Patient was not following correct dosing instructions)

## 2018-06-26 NOTE — Progress Notes (Signed)
Medical screening examination/treatment/procedure(s) were performed by non-physician practitioner and as supervising physician I was immediately available for consultation/collaboration. I agree with above. James John, MD   

## 2018-06-29 ENCOUNTER — Other Ambulatory Visit: Payer: Self-pay | Admitting: Family Medicine

## 2018-07-02 ENCOUNTER — Telehealth: Payer: Self-pay | Admitting: Family Medicine

## 2018-07-02 NOTE — Telephone Encounter (Signed)
Gary Yates called from [facility] Patient INR  - 3.7 Trac call back # [phone]

## 2018-07-03 ENCOUNTER — Ambulatory Visit (INDEPENDENT_AMBULATORY_CARE_PROVIDER_SITE_OTHER): Payer: Medicare Other | Admitting: General Practice

## 2018-07-03 DIAGNOSIS — Z7901 Long term (current) use of anticoagulants: Secondary | ICD-10-CM

## 2018-07-03 LAB — POCT INR: INR: 3.7 — AB (ref 2.0–3.0)

## 2018-07-03 NOTE — Telephone Encounter (Signed)
Noted.  This has been addressed. °

## 2018-07-03 NOTE — Patient Instructions (Addendum)
Pre visit review using our clinic review tool, if applicable. No additional management support is needed unless otherwise documented below in the visit note.  Hold coumadin today and and then change dosage and take  5 mg daily except 7.5 mg on Sunday and Thursdays.   Dosing instructions given to T. Pecola Lawless, RN @ [facility]  [phone].   Re-check in 1 weeks. (Patient was not following correct dosing instructions)

## 2018-07-15 ENCOUNTER — Ambulatory Visit (INDEPENDENT_AMBULATORY_CARE_PROVIDER_SITE_OTHER): Payer: Medicare Other | Admitting: General Practice

## 2018-07-15 ENCOUNTER — Telehealth: Payer: Self-pay | Admitting: Family Medicine

## 2018-07-15 DIAGNOSIS — Z7901 Long term (current) use of anticoagulants: Secondary | ICD-10-CM | POA: Diagnosis not present

## 2018-07-15 DIAGNOSIS — I480 Paroxysmal atrial fibrillation: Secondary | ICD-10-CM

## 2018-07-15 LAB — POCT INR: INR: 3.2 — AB (ref 2.0–3.0)

## 2018-07-15 NOTE — Telephone Encounter (Signed)
Tranace from [facility] called request to continue verbal orders for Nursing, once every 2 weeks for 8 weeks.  Tranace call back [phone]

## 2018-07-15 NOTE — Patient Instructions (Addendum)
Pre visit review using our clinic review tool, if applicable. No additional management support is needed unless otherwise documented below in the visit note.  Hold coumadin today and  then change dosage and take  5 mg daily except 7.5 mg on Sunday only.  Dosing instructions given to Adriana Mccallum, RN @ [facility]  [phone].  Re-check in 2 weeks.  Dosing instructions given to patient and understanding verbalized.

## 2018-07-15 NOTE — Telephone Encounter (Signed)
ok 

## 2018-07-16 NOTE — Telephone Encounter (Signed)
Clinic RN spoke with Trance and gave verbal order per Dr. Elease Hashimoto

## 2018-07-21 ENCOUNTER — Other Ambulatory Visit: Payer: Medicare Other

## 2018-07-21 ENCOUNTER — Ambulatory Visit: Payer: Medicare Other | Admitting: Family

## 2018-07-23 LAB — HM DIABETES EYE EXAM

## 2018-07-30 ENCOUNTER — Telehealth: Payer: Self-pay | Admitting: Family Medicine

## 2018-07-30 NOTE — Telephone Encounter (Signed)
Pt  Called Melatonin 1 MG TABS

## 2018-08-04 ENCOUNTER — Other Ambulatory Visit: Payer: Self-pay | Admitting: Family Medicine

## 2018-08-05 ENCOUNTER — Inpatient Hospital Stay: Payer: Medicare Other

## 2018-08-05 ENCOUNTER — Other Ambulatory Visit: Payer: Self-pay

## 2018-08-05 ENCOUNTER — Encounter: Payer: Self-pay | Admitting: Hematology & Oncology

## 2018-08-05 ENCOUNTER — Inpatient Hospital Stay: Payer: Medicare Other | Attending: Hematology & Oncology

## 2018-08-05 ENCOUNTER — Inpatient Hospital Stay (HOSPITAL_BASED_OUTPATIENT_CLINIC_OR_DEPARTMENT_OTHER): Payer: Medicare Other | Admitting: Hematology & Oncology

## 2018-08-05 VITALS — BP 124/56 | HR 76 | Temp 97.8°F | Resp 19 | Wt 227.0 lb

## 2018-08-05 DIAGNOSIS — Z79899 Other long term (current) drug therapy: Secondary | ICD-10-CM | POA: Insufficient documentation

## 2018-08-05 DIAGNOSIS — R600 Localized edema: Secondary | ICD-10-CM

## 2018-08-05 DIAGNOSIS — Z9225 Personal history of immunosupression therapy: Secondary | ICD-10-CM | POA: Insufficient documentation

## 2018-08-05 DIAGNOSIS — D631 Anemia in chronic kidney disease: Secondary | ICD-10-CM

## 2018-08-05 DIAGNOSIS — Z9221 Personal history of antineoplastic chemotherapy: Secondary | ICD-10-CM

## 2018-08-05 DIAGNOSIS — Z923 Personal history of irradiation: Secondary | ICD-10-CM | POA: Diagnosis not present

## 2018-08-05 DIAGNOSIS — N189 Chronic kidney disease, unspecified: Secondary | ICD-10-CM | POA: Diagnosis present

## 2018-08-05 DIAGNOSIS — R634 Abnormal weight loss: Secondary | ICD-10-CM

## 2018-08-05 DIAGNOSIS — E1142 Type 2 diabetes mellitus with diabetic polyneuropathy: Secondary | ICD-10-CM

## 2018-08-05 DIAGNOSIS — D500 Iron deficiency anemia secondary to blood loss (chronic): Secondary | ICD-10-CM

## 2018-08-05 DIAGNOSIS — R531 Weakness: Secondary | ICD-10-CM

## 2018-08-05 DIAGNOSIS — E1140 Type 2 diabetes mellitus with diabetic neuropathy, unspecified: Secondary | ICD-10-CM

## 2018-08-05 DIAGNOSIS — C8589 Other specified types of non-Hodgkin lymphoma, extranodal and solid organ sites: Secondary | ICD-10-CM

## 2018-08-05 DIAGNOSIS — H538 Other visual disturbances: Secondary | ICD-10-CM

## 2018-08-05 DIAGNOSIS — M549 Dorsalgia, unspecified: Secondary | ICD-10-CM | POA: Diagnosis not present

## 2018-08-05 DIAGNOSIS — Z7901 Long term (current) use of anticoagulants: Secondary | ICD-10-CM

## 2018-08-05 DIAGNOSIS — Z95828 Presence of other vascular implants and grafts: Secondary | ICD-10-CM

## 2018-08-05 LAB — CBC WITH DIFFERENTIAL (CANCER CENTER ONLY)
Abs Immature Granulocytes: 0.03 10*3/uL (ref 0.00–0.07)
Basophils Absolute: 0.1 10*3/uL (ref 0.0–0.1)
Basophils Relative: 1 %
Eosinophils Absolute: 0.2 10*3/uL (ref 0.0–0.5)
Eosinophils Relative: 3 %
HCT: 30.1 % — ABNORMAL LOW (ref 39.0–52.0)
Hemoglobin: 9.8 g/dL — ABNORMAL LOW (ref 13.0–17.0)
Immature Granulocytes: 1 %
Lymphocytes Relative: 28 %
Lymphs Abs: 1.7 10*3/uL (ref 0.7–4.0)
MCH: 32.7 pg (ref 26.0–34.0)
MCHC: 32.6 g/dL (ref 30.0–36.0)
MCV: 100.3 fL — ABNORMAL HIGH (ref 80.0–100.0)
Monocytes Absolute: 0.5 10*3/uL (ref 0.1–1.0)
Monocytes Relative: 8 %
Neutro Abs: 3.5 10*3/uL (ref 1.7–7.7)
Neutrophils Relative %: 59 %
Platelet Count: 149 10*3/uL — ABNORMAL LOW (ref 150–400)
RBC: 3 MIL/uL — ABNORMAL LOW (ref 4.22–5.81)
RDW: 13.2 % (ref 11.5–15.5)
WBC Count: 5.9 10*3/uL (ref 4.0–10.5)
nRBC: 0 % (ref 0.0–0.2)

## 2018-08-05 LAB — CMP (CANCER CENTER ONLY)
ALT: 4 U/L (ref 0–44)
AST: 12 U/L — ABNORMAL LOW (ref 15–41)
Albumin: 3.6 g/dL (ref 3.5–5.0)
Alkaline Phosphatase: 87 U/L (ref 38–126)
Anion gap: 8 (ref 5–15)
BUN: 15 mg/dL (ref 8–23)
CO2: 30 mmol/L (ref 22–32)
Calcium: 8.7 mg/dL — ABNORMAL LOW (ref 8.9–10.3)
Chloride: 101 mmol/L (ref 98–111)
Creatinine: 1.22 mg/dL (ref 0.61–1.24)
GFR, Est AFR Am: >60 mL/min (ref >60)
GFR, Estimated: 54 mL/min — ABNORMAL LOW (ref >60)
Glucose, Bld: 105 mg/dL — ABNORMAL HIGH (ref 70–99)
Potassium: 3.8 mmol/L (ref 3.5–5.1)
Sodium: 139 mmol/L (ref 135–145)
Total Bilirubin: 0.4 mg/dL (ref 0.3–1.2)
Total Protein: 6.6 g/dL (ref 6.5–8.1)

## 2018-08-05 LAB — HEMOGLOBIN A1C
Hgb A1c MFr Bld: 6.3 % — ABNORMAL HIGH (ref 4.8–5.6)
Mean Plasma Glucose: 134.11 mg/dL

## 2018-08-05 MED ORDER — HEPARIN SOD (PORK) LOCK FLUSH 100 UNIT/ML IV SOLN
500.0000 [IU] | Freq: Once | INTRAVENOUS | Status: AC
  Administered 2018-08-05: 500 [IU] via INTRAVENOUS
  Filled 2018-08-05: qty 5

## 2018-08-05 MED ORDER — SODIUM CHLORIDE 0.9% FLUSH
10.0000 mL | INTRAVENOUS | Status: DC | PRN
  Administered 2018-08-05: 10 mL via INTRAVENOUS
  Filled 2018-08-05: qty 10

## 2018-08-05 MED ORDER — DARBEPOETIN ALFA 300 MCG/0.6ML IJ SOSY
300.0000 ug | PREFILLED_SYRINGE | Freq: Once | INTRAMUSCULAR | Status: AC
  Administered 2018-08-05: 300 ug via SUBCUTANEOUS

## 2018-08-05 NOTE — Patient Instructions (Signed)
Darbepoetin Alfa injection What is this medicine? DARBEPOETIN ALFA (dar be POE e tin AL fa) helps your body make more red blood cells. It is used to treat anemia caused by chronic kidney failure and chemotherapy. This medicine may be used for other purposes; ask your health care provider or pharmacist if you have questions. COMMON BRAND NAME(S): Aranesp What should I tell my health care provider before I take this medicine? They need to know if you have any of these conditions:  blood clotting disorders or history of blood clots  cancer patient not on chemotherapy  cystic fibrosis  heart disease, such as angina, heart failure, or a history of a heart attack  hemoglobin level of 12 g/dL or greater  high blood pressure  low levels of folate, iron, or vitamin B12  seizures  an unusual or allergic reaction to darbepoetin, erythropoietin, albumin, hamster proteins, latex, other medicines, foods, dyes, or preservatives  pregnant or trying to get pregnant  breast-feeding How should I use this medicine? This medicine is for injection into a vein or under the skin. It is usually given by a health care professional in a hospital or clinic setting. If you get this medicine at home, you will be taught how to prepare and give this medicine. Use exactly as directed. Take your medicine at regular intervals. Do not take your medicine more often than directed. It is important that you put your used needles and syringes in a special sharps container. Do not put them in a trash can. If you do not have a sharps container, call your pharmacist or healthcare provider to get one. A special MedGuide will be given to you by the pharmacist with each prescription and refill. Be sure to read this information carefully each time. Talk to your pediatrician regarding the use of this medicine in children. While this medicine may be used in children as young as 1 month of age for selected conditions, precautions do  apply. Overdosage: If you think you have taken too much of this medicine contact a poison control center or emergency room at once. NOTE: This medicine is only for you. Do not share this medicine with others. What if I miss a dose? If you miss a dose, take it as soon as you can. If it is almost time for your next dose, take only that dose. Do not take double or extra doses. What may interact with this medicine? Do not take this medicine with any of the following medications:  epoetin alfa This list may not describe all possible interactions. Give your health care provider a list of all the medicines, herbs, non-prescription drugs, or dietary supplements you use. Also tell them if you smoke, drink alcohol, or use illegal drugs. Some items may interact with your medicine. What should I watch for while using this medicine? Your condition will be monitored carefully while you are receiving this medicine. You may need blood work done while you are taking this medicine. This medicine may cause a decrease in vitamin B6. You should make sure that you get enough vitamin B6 while you are taking this medicine. Discuss the foods you eat and the vitamins you take with your health care professional. What side effects may I notice from receiving this medicine? Side effects that you should report to your doctor or health care professional as soon as possible:  allergic reactions like skin rash, itching or hives, swelling of the face, lips, or tongue  breathing problems  changes in   vision  chest pain  confusion, trouble speaking or understanding  feeling faint or lightheaded, falls  high blood pressure  muscle aches or pains  pain, swelling, warmth in the leg  rapid weight gain  severe headaches  sudden numbness or weakness of the face, arm or leg  trouble walking, dizziness, loss of balance or coordination  seizures (convulsions)  swelling of the ankles, feet, hands  unusually weak or  tired Side effects that usually do not require medical attention (report to your doctor or health care professional if they continue or are bothersome):  diarrhea  fever, chills (flu-like symptoms)  headaches  nausea, vomiting  redness, stinging, or swelling at site where injected This list may not describe all possible side effects. Call your doctor for medical advice about side effects. You may report side effects to FDA at 1-800-FDA-1088. Where should I keep my medicine? Keep out of the reach of children. Store in a refrigerator between 2 and 8 degrees C (36 and 46 degrees F). Do not freeze. Do not shake. Throw away any unused portion if using a single-dose vial. Throw away any unused medicine after the expiration date. NOTE: This sheet is a summary. It may not cover all possible information. If you have questions about this medicine, talk to your doctor, pharmacist, or health care provider.  2020 Elsevier/Gold Standard (2017-01-08 16:44:20)  

## 2018-08-05 NOTE — Patient Instructions (Signed)

## 2018-08-05 NOTE — Progress Notes (Signed)
Hematology and Oncology Follow Up Visit  Gary Yates [id number] March 04, 1933 85 y.o. 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  He actually is doing quite well.  He says he feels pretty good.  He is losing weight.  Hopefully, this is from his diuretic protocol.  Despite the coronavirus, he really has not had much in the way of difficulties.  He does have chronic back issues.  He says this has not caused any further problems for him.  He has heart issues.  He is on Coumadin.  He has had no bleeding.  He has had no issues with heart failure.  There is been no nausea or vomiting.  He has chronic leg swelling.  He does not sleep all that well.  His last PET scan was done back in November 2019.  This did not show any evidence of recurrent lymphoma.  Overall, his performance status is ECOG 2-3.    Medications:  Current Outpatient Medications:  .  ACCU-CHEK AVIVA PLUS test strip, TEST 3 TIMES A DAY (Patient taking differently: TEST 2 TIMES A DAY), Disp: 300 each, Rfl: 5 .  acetaminophen (TYLENOL) 500 MG tablet, Take 1,000 mg by mouth every 6 (six) hours as needed (for pain/fever/headaches.). , Disp: , Rfl:  .  atorvastatin (LIPITOR) 40 MG tablet, TAKE 1 TABLET BY MOUTH EVERY DAY, Disp: 30 tablet, Rfl: 2 .  furosemide (LASIX) 40 MG tablet, Take one tablet by mouth every other day. (Patient taking differently: daily. Take one tablet by mouth every other day.), Disp: 30 tablet, Rfl: 0  .  gabapentin (NEURONTIN) 300 MG capsule, TAKE 2 CAPSULES BY MOUTH 3 TIMES A DAY, Disp: 540 capsule, Rfl: 3 .  metFORMIN (GLUCOPHAGE) 500 MG tablet, TAKE 1 TABLET BY MOUTH TWICE A DAY WITH MEALS, Disp: 180 tablet, Rfl: 2 .  metoprolol succinate (TOPROL XL) 25 MG 24 hr tablet, Take 1 tablet (25 mg total) by mouth daily., Disp: 30 tablet, Rfl: 11 .  pantoprazole (PROTONIX) 40 MG tablet, TAKE 1 TABLET BY MOUTH EVERY DAY, Disp: 90 tablet, Rfl: 3 .  pramipexole (MIRAPEX) 1.5 MG tablet, Take 1 tablet (1.5 mg total) by mouth 2 (two) times daily. (Patient taking differently: Take 3 mg by mouth at bedtime. ), Disp: 60 tablet, Rfl: 5 .  tobramycin (TOBREX) 0.3 % ophthalmic solution, Place 2 drops into the right eye every 4 (four) hours., Disp: 5 mL, Rfl: 0 .  warfarin (COUMADIN) 5 MG tablet, Take 1 1/2 tablets daily except take 1 tablet on Monday Wed Friday. (Patient taking differently: 08/05/2018 Take 1 tablet daily except 1.5 tablets on Saturday.), Disp: 45 tablet, Rfl: 3 .  azithromycin (ZITHROMAX Z-PAK) 250 MG tablet, Take as directed, Disp: 6 each, Rfl: 0 .  docusate sodium (COLACE) 50 MG capsule, Take 1 capsule (50 mg total) by mouth 2 (two) times daily as needed (for  constipation)., Disp: 90 capsule, Rfl: 0 .  KLOR-CON M20 20 MEQ tablet, TAKE 1 TABLET BY MOUTH EVERY DAY (Patient not taking: Reported on 08/05/2018), Disp: 30 tablet, Rfl: 2 .  magic mouthwash w/lidocaine SOLN, Take 5 mLs by mouth 4 (four) times daily as needed for mouth pain. 1:1 ratio. DIPHENHYDRAMINE HCL (ANTIHISTAMINES - ETHANOLAMINES),ALUM & MAG HYDROXIDE-SIMETH,NYSTATIN (ANTI-INFECTIVES - THROAT),LIDOCAINE HCL (ANESTHETICS TOPICAL ORAL), Disp: 600 mL, Rfl: 3 .  Melatonin 1 MG TABS, Take 1 tablet by mouth at bedtime. , Disp: , Rfl:  .  Multiple Vitamins-Minerals (MULTIVITAMIN WITH MINERALS) tablet, Take 1 tablet by mouth daily., Disp: , Rfl:  .  nitroGLYCERIN (NITROSTAT) 0.4 MG SL tablet, Place 1 tablet (0.4 mg total) under the tongue  every 5 (five) minutes as needed. Chest pain (Patient not taking: Reported on 08/05/2018), Disp: 25 tablet, Rfl: 6 .  nystatin (MYCOSTATIN/NYSTOP) powder, APPLY TO AFFECTED AREA 4 TIMES A DAY (Patient not taking: Reported on 08/05/2018), Disp: 60 g, Rfl: 0 .  nystatin-triamcinolone ointment (MYCOLOG), Apply 1 application topically 2 (two) times daily. (Patient not taking: Reported on 08/05/2018), Disp: 15 g, Rfl: 0 .  polyethylene glycol (MIRALAX / GLYCOLAX) packet, Take 17 g by mouth daily., Disp: 14 each, Rfl: 0 .  TOUJEO SOLOSTAR 300 UNIT/ML SOPN, INJECT 20 UNITS INTO THE SKIN DAILY AFTER BREAKFAST (Patient not taking: Reported on 08/05/2018), Disp: 4.5 pen, Rfl: 2 .  vitamin B-12 (CYANOCOBALAMIN) 1000 MCG tablet, Take 1 tablet (1,000 mcg total) by mouth daily., Disp: 30 tablet, Rfl: 0 No current facility-administered medications for this visit.   Facility-Administered Medications Ordered in Other Visits:  .  acetaminophen (TYLENOL) tablet 650 mg, 650 mg, Oral, Once, [place], Sarah M, NP .  pegfilgrastim (NEULASTA ONPRO KIT) injection 6 mg, 6 mg, Subcutaneous, Once, [place]never, Peter R, MD .  sodium chloride flush (NS) 0.9 % injection 10 mL, 10 mL, Intracatheter, PRN, Volanda Napoleon, MD, 10 mL at 02/27/17 1503 .  sodium chloride flush (NS) 0.9 % injection 10 mL, 10 mL, Intravenous, PRN, Volanda Napoleon, MD, 10 mL at 05/07/17 6195 .  sodium chloride flush (NS) 0.9 % injection 10 mL, 10 mL, Intravenous, PRN, [place], Sarah M, NP, 10 mL at 08/05/18 1140  Allergies:  Allergies  Allergen Reactions  . Ace Inhibitors Other (See Comments)    cough  . Codeine Nausea Only and Rash       . Penicillins Rash    Childhood allergy Has patient had a PCN reaction causing immediate rash, facial/tongue/throat swelling, SOB or lightheadedness with hypotension: Yes Has patient had a PCN reaction causing severe rash involving mucus membranes or skin necrosis: Yes Has patient had a PCN reaction that  required hospitalization No Has patient had a PCN reaction occurring within the last 10 years: No If all of the above answers are "NO", then may proceed with Cephalosporin use.     Past Medical History, Surgical history, Social history, and Family History were reviewed and updated.  Review of Systems: Review of Systems  HENT: Positive for sinus pain and sore throat.   Eyes: Positive for blurred vision.  Respiratory: Positive for wheezing.   Cardiovascular: Positive for leg swelling.  Gastrointestinal: Negative.   Genitourinary: Negative.   Musculoskeletal: Positive for back pain.  Skin: Negative.   Neurological: Positive for weakness.  Endo/Heme/Allergies: Negative.   Psychiatric/Behavioral: Negative.     Physical Exam:  weight is 227 lb (103 kg). His temporal temperature is 97.8 F (36.6 C). His blood pressure is  124/56 (abnormal) and his pulse is 76. His respiration is 19 and oxygen saturation is 97%.   Wt Readings from Last 3 Encounters:  08/05/18 227 lb (103 kg)  04/08/18 225 lb (102.1 kg)  03/23/18 228 lb 9.6 oz (103.7 kg)      Physical Exam Vitals signs reviewed.  HENT:     Head: Normocephalic and atraumatic.  Eyes:     Pupils: Pupils are equal, round, and reactive to light.  Neck:     Musculoskeletal: Normal range of motion.  Cardiovascular:     Rate and Rhythm: Normal rate and regular rhythm.     Heart sounds: Normal heart sounds.  Pulmonary:     Effort: Pulmonary effort is normal.     Breath sounds: Normal breath sounds.  Abdominal:     General: Bowel sounds are normal.     Palpations: Abdomen is soft.  Musculoskeletal: Normal range of motion.        General: No tenderness or deformity.  Lymphadenopathy:     Cervical: No cervical adenopathy.  Skin:    General: Skin is warm and dry.     Findings: No erythema or rash.  Neurological:     Mental Status: He is alert and oriented to person, place, and time.  Psychiatric:        Behavior: Behavior  normal.        Thought Content: Thought content normal.        Judgment: Judgment normal.      Lab Results  Component Value Date   WBC 5.9 08/05/2018   HGB 9.8 (L) 08/05/2018   HCT 30.1 (L) 08/05/2018   MCV 100.3 (H) 08/05/2018   PLT 149 (L) 08/05/2018     Chemistry      Component Value Date/Time   NA 139 08/05/2018 1156   NA 141 01/10/2017 1136   NA 137 12/14/2015 1100   K 3.8 08/05/2018 1156   K 4.4 01/10/2017 1136   K 3.7 12/14/2015 1100   CL 101 08/05/2018 1156   CL 104 01/10/2017 1136   CO2 30 08/05/2018 1156   CO2 27 01/10/2017 1136   CO2 24 12/14/2015 1100   BUN 15 08/05/2018 1156   BUN 19 01/10/2017 1136   BUN 18.4 12/14/2015 1100   CREATININE 1.22 08/05/2018 1156   CREATININE 1.1 01/10/2017 1136   CREATININE 0.9 12/14/2015 1100      Component Value Date/Time   CALCIUM 8.7 (L) 08/05/2018 1156   CALCIUM 9.4 01/10/2017 1136   CALCIUM 9.5 12/14/2015 1100   ALKPHOS 87 08/05/2018 1156   ALKPHOS 112 (H) 01/10/2017 1136   ALKPHOS 102 12/14/2015 1100   AST 12 (L) 08/05/2018 1156   AST 22 12/14/2015 1100   ALT 4 08/05/2018 1156   ALT 18 01/10/2017 1136   ALT 10 12/14/2015 1100   BILITOT 0.4 08/05/2018 1156   BILITOT 0.58 12/14/2015 1100      Impression and Plan: Mr. Emberton is an 85 year old white male. He has relapsed large cell non-Hodgkin lymphoma of the testicle. He was on salvage chemotherapy with Rituxan/bendamustine/Velcade.  He did not really respond well to this.  We then treated him with dose reduced R-ICE.  He responded very well to this.  His last PET scan showed that he was in remission.    Today, we have to give him any Aranesp.  I am sure that this will help him.  He really is symptomatic but I do not think that he can really stand  too much of an anemia.  We will see what his iron levels are.  I think he did get some IV iron back in January.  This worked very very nicely.  I will see him back in 4 months now.  He will come back in 2 months for a  Port-A-Cath flush  We will see him back, we will flush his port a patch.     Volanda Napoleon, MD 7/29/202012:49 PM

## 2018-08-06 ENCOUNTER — Telehealth: Payer: Self-pay

## 2018-08-06 ENCOUNTER — Encounter: Payer: Self-pay | Admitting: Family Medicine

## 2018-08-06 LAB — IRON AND TIBC
Iron: 66 ug/dL (ref 42–163)
Saturation Ratios: 29 % (ref 20–55)
TIBC: 226 ug/dL (ref 202–409)
UIBC: 160 ug/dL (ref 117–376)

## 2018-08-06 LAB — FERRITIN: Ferritin: 601 ng/mL — ABNORMAL HIGH (ref 24–336)

## 2018-08-06 LAB — TSH: TSH: 4.795 u[IU]/mL — ABNORMAL HIGH (ref 0.320–4.118)

## 2018-08-06 LAB — LACTATE DEHYDROGENASE: LDH: 172 U/L (ref 98–192)

## 2018-08-06 NOTE — Telephone Encounter (Signed)
Copied from [place] [id number]. Topic: General - Other >> Aug 06, 2018  2:15 PM Rainey Pines A wrote: Olivia Mackie from [facility] would like to get an approval to get a medication profile for patient and also stated that he is gaining weight. Patient stated that he is suppose to take 2 diuretics but has only been taking one a day. Olivia Mackie contact number is [phone]

## 2018-08-07 ENCOUNTER — Ambulatory Visit (INDEPENDENT_AMBULATORY_CARE_PROVIDER_SITE_OTHER): Payer: Medicare Other | Admitting: General Practice

## 2018-08-07 DIAGNOSIS — I480 Paroxysmal atrial fibrillation: Secondary | ICD-10-CM

## 2018-08-07 DIAGNOSIS — Z7901 Long term (current) use of anticoagulants: Secondary | ICD-10-CM | POA: Diagnosis not present

## 2018-08-07 LAB — POCT INR: INR: 2.2 (ref 2.0–3.0)

## 2018-08-07 NOTE — Telephone Encounter (Signed)
I have no idea what they mean by getting a medication profile for patient.  Please clarify.

## 2018-08-07 NOTE — Telephone Encounter (Signed)
Called Gary Yates and verified the Lasix instructions and patients weight. Gary Yates verbalized an understanding.

## 2018-08-07 NOTE — Telephone Encounter (Signed)
Please see message. °

## 2018-08-07 NOTE — Patient Instructions (Signed)
Pre visit review using our clinic review tool, if applicable. No additional management support is needed unless otherwise documented below in the visit note.  Continue to take  5 mg daily except 7.5 mg on Sunday only.  Dosing instructions given to Olivia Mackie, RN @ [facility]  [phone].  Re-check in 2 weeks.  Dosing instructions given to patient and understanding verbalized.

## 2018-08-09 ENCOUNTER — Other Ambulatory Visit: Payer: Self-pay | Admitting: Family Medicine

## 2018-08-09 DIAGNOSIS — Z7901 Long term (current) use of anticoagulants: Secondary | ICD-10-CM

## 2018-08-10 ENCOUNTER — Telehealth: Payer: Self-pay

## 2018-08-10 NOTE — Telephone Encounter (Signed)
I do not see anywhere written that he should be taking two a day-  May refill, but would continue with instructions as per Dr Angelena Form.

## 2018-08-10 NOTE — Telephone Encounter (Signed)
Please see message.  OK?  Copied from [place] [id number]. Topic: General - Inquiry >> Aug 10, 2018 11:04 AM Berneta Levins wrote: Reason for CRM:   Pt's daughter, [name], calling.  States that she needs to get Dr. Elease Hashimoto to put in an order for a TSH check to be done by [facility].   [name] can be reached at [phone]

## 2018-08-10 NOTE — Telephone Encounter (Signed)
I am confused.  He just had a TSH done on 7/29.  Not sure why he would need this soon??

## 2018-08-10 NOTE — Telephone Encounter (Signed)
Furosemide last filled by Dr. Lauree Chandler on 10/09/17 with instructions to take 1 every other day.  Patient states he is supposed to be taking 2 a day.  Are you filling this? Or sent to Dr. Angelena Form?

## 2018-08-10 NOTE — Telephone Encounter (Signed)
Called patient and LMOVM to return call  Nicasio for [facility] to Discuss results / PCP / recommendations / Schedule patient  Gary Yates back and left a detailed voice message to let her know that her dad just had the TSH labs done on 08/05/18 from Dr. Marin Olp his Oncology doctor.  CRM Created

## 2018-08-13 NOTE — Telephone Encounter (Signed)
See note

## 2018-08-13 NOTE — Telephone Encounter (Signed)
Pt was wanting to know if he could have this sleep aid Melatonin 1 MG TABS, not sleeping at nite, if this is not acceptable recommendation [id number] [name], [place] - [address] [phone] (Phone) [phone] (Fax)

## 2018-08-14 ENCOUNTER — Other Ambulatory Visit: Payer: Self-pay

## 2018-08-14 MED ORDER — MELATONIN 1 MG PO TABS: 1.0000 | ORAL_TABLET | Freq: Every day | ORAL | 2 refills | Status: DC

## 2018-08-14 NOTE — Telephone Encounter (Signed)
Yes.  They can titrate up as high as 5 mg with the Melatonin if needed- but start at 1 mg

## 2018-08-14 NOTE — Telephone Encounter (Signed)
Please advise 

## 2018-08-14 NOTE — Telephone Encounter (Signed)
Medication has been sent to the pharmacy for patient.

## 2018-08-17 ENCOUNTER — Encounter: Payer: Self-pay | Admitting: Family Medicine

## 2018-08-17 ENCOUNTER — Telehealth: Payer: Self-pay | Admitting: Family Medicine

## 2018-08-17 NOTE — Telephone Encounter (Signed)
Copied from [place] [id number]. Topic: General - Inquiry >> Aug 17, 2018  2:45 PM [name], [place] wrote: Reason for CRM: Patient's daughter called in stating they are needing a letter from PCP stating that the patient cannot access mailbox, due to distance. Patient's daughter states they are in the process of trying to move it and post office is needing a doctors statement in regards to moving mailbox. Please advise. Call back is [phone].

## 2018-08-17 NOTE — Telephone Encounter (Signed)
Letter done

## 2018-08-17 NOTE — Telephone Encounter (Signed)
Please see message. °

## 2018-08-18 NOTE — Telephone Encounter (Signed)
Called Lattie Haw and advised her that the letter for her dad is ready for pick up at the front desk. Advised her of number to call to be screened before entering building and to wear a mask. Lattie Haw verbalized an understanding.

## 2018-08-20 LAB — POCT INR: INR: 4.5 — AB (ref 2.0–3.0)

## 2018-08-21 ENCOUNTER — Ambulatory Visit (INDEPENDENT_AMBULATORY_CARE_PROVIDER_SITE_OTHER): Payer: Medicare Other | Admitting: General Practice

## 2018-08-21 DIAGNOSIS — Z7901 Long term (current) use of anticoagulants: Secondary | ICD-10-CM

## 2018-08-21 DIAGNOSIS — I480 Paroxysmal atrial fibrillation: Secondary | ICD-10-CM

## 2018-08-21 NOTE — Patient Instructions (Addendum)
Pre visit review using our clinic review tool, if applicable. No additional management support is needed unless otherwise documented below in the visit note.  Please hold dosage Thursday and Friday (8/13 and 8/14) and then change dosage and take 5 mg(1 tablet) daily.   Dosing instructions given to Nancie Neas, RN @ [facility]  [phone] and also spoke with patient.  Patient verbalized understanding and I am sending a AVS in the mail today.  Re-check in 1 week.

## 2018-08-25 ENCOUNTER — Other Ambulatory Visit: Payer: Self-pay

## 2018-08-25 MED ORDER — ACCU-CHEK AVIVA PLUS VI STRP: 1.0000 | ORAL_STRIP | Freq: Three times a day (TID) | 1 refills | Status: DC

## 2018-08-28 ENCOUNTER — Telehealth: Payer: Self-pay | Admitting: Family Medicine

## 2018-08-28 NOTE — Telephone Encounter (Signed)
Disregard

## 2018-08-28 NOTE — Telephone Encounter (Signed)
Pts daughter [name]) dropped off [employer] to be completed by the provider.  Upon completion daughter [name]) would like to have a call [phone](cell) to pick the form up.  Form placed in providers folder for completion.

## 2018-08-30 ENCOUNTER — Other Ambulatory Visit: Payer: Self-pay | Admitting: Cardiovascular Disease

## 2018-08-31 NOTE — Telephone Encounter (Signed)
done

## 2018-08-31 NOTE — Telephone Encounter (Signed)
Paperwork placed in red folder. Please return when complete. Thank you!

## 2018-09-01 NOTE — Telephone Encounter (Signed)
Called Gary Yates and let her know that the form is ready for pick up and she stated that she will be by today or tomorrow and pick up the paperwork. She thanked Dr. Elease Hashimoto for taking the time to fill out the form.

## 2018-09-02 LAB — POCT INR: INR: 4.4 — AB (ref 2.0–3.0)

## 2018-09-03 ENCOUNTER — Telehealth: Payer: Self-pay | Admitting: *Deleted

## 2018-09-03 NOTE — Telephone Encounter (Signed)
Call report: INR- 4.4   Please call patient directly ( home number-if medication changes needed)- if recheck needed please place order

## 2018-09-04 NOTE — Telephone Encounter (Signed)
Please see new message. Please advise.

## 2018-09-04 NOTE — Telephone Encounter (Signed)
Patient notified of message from Dr. Elease Hashimoto to hold coumadin for one day, resume normal dosing schedule the next day, and repeat PT/INR in one week.  Patient expressed understanding.   He would like for Dr. Erick Blinks nurse to send him some information in the mail on diet while taking Coumadin and what foods to avoid.  Advised him I would send a message with his request.

## 2018-09-04 NOTE — Telephone Encounter (Signed)
Hold Coumadin for 1 day only.  Then resume regular dosing and follow-up for recheck with Coumadin clinic in 1 week

## 2018-09-04 NOTE — Telephone Encounter (Signed)
LMTCB  PEC NT may speak with pt to give medication recommendations.

## 2018-09-06 ENCOUNTER — Other Ambulatory Visit: Payer: Self-pay | Admitting: Cardiovascular Disease

## 2018-09-06 ENCOUNTER — Other Ambulatory Visit: Payer: Self-pay | Admitting: Family Medicine

## 2018-09-06 NOTE — Telephone Encounter (Signed)
Have Villa Herb address dietary concerns with him when she gets back.

## 2018-09-07 ENCOUNTER — Telehealth: Payer: Self-pay | Admitting: Family Medicine

## 2018-09-07 NOTE — Telephone Encounter (Signed)
As per my last note, he sees Villa Herb who manages our coumadin clinic.  If they are needing additional dietary guidance, suggest they meet with her .

## 2018-09-07 NOTE — Telephone Encounter (Signed)
Please see message. Can you call to discuss with the case manager?

## 2018-09-07 NOTE — Telephone Encounter (Signed)
Please see message. °

## 2018-09-07 NOTE — Telephone Encounter (Signed)
You may be able to answer diet questions better than I can if you can call this case worker to inform. Thank you!

## 2018-09-07 NOTE — Telephone Encounter (Signed)
Copied from [place] [id number]. Topic: General - Other >> Sep 07, 2018  2:55 PM Leward Quan A wrote: Reason for CRM: Ms Levonne Lapping Case manager with [facility] called to request a call back from Dr Elease Hashimoto or his nurse. She wanted to ask about patients last 3 INR and to inform that she have counseled patient on proper diet while on the warfarin (COUMADIN) 5 MG tablet intake. Ivin Booty can be reached at Ph# [phone] [phone]

## 2018-09-08 ENCOUNTER — Ambulatory Visit (INDEPENDENT_AMBULATORY_CARE_PROVIDER_SITE_OTHER): Payer: Medicare Other | Admitting: General Practice

## 2018-09-08 ENCOUNTER — Telehealth: Payer: Self-pay | Admitting: General Practice

## 2018-09-08 DIAGNOSIS — I480 Paroxysmal atrial fibrillation: Secondary | ICD-10-CM

## 2018-09-08 DIAGNOSIS — Z7901 Long term (current) use of anticoagulants: Secondary | ICD-10-CM

## 2018-09-08 NOTE — Patient Instructions (Addendum)
Pre visit review using our clinic review tool, if applicable. No additional management support is needed unless otherwise documented below in the visit note.  Patient was given instructions on 8/26 by Dr. Elease Hashimoto to hold 1 dose of coumadin and then continue on 1 tablet daily.  Dosing instructions also given to Nancie Neas, RN @ [facility]  [phone].  Patient will be re-checked Wednesday, 9/2 and results called to C. Luciana Axe, RN

## 2018-09-08 NOTE — Telephone Encounter (Signed)
Opened in error

## 2018-09-09 ENCOUNTER — Ambulatory Visit (INDEPENDENT_AMBULATORY_CARE_PROVIDER_SITE_OTHER): Payer: Medicare Other | Admitting: General Practice

## 2018-09-09 DIAGNOSIS — Z7901 Long term (current) use of anticoagulants: Secondary | ICD-10-CM | POA: Diagnosis not present

## 2018-09-09 DIAGNOSIS — I480 Paroxysmal atrial fibrillation: Secondary | ICD-10-CM

## 2018-09-09 LAB — POCT INR: INR: 3.2 — AB (ref 2.0–3.0)

## 2018-09-09 NOTE — Patient Instructions (Signed)
Pre visit review using our clinic review tool, if applicable. No additional management support is needed unless otherwise documented below in the visit note.  Hold coumadin today.  Tomorrow start taking 1/2 tablet daily except 1 tablet on Mon Wed and Fridays.  Re-check in 1 week.  Dosing instructions also given to Nancie Neas, RN @ [facility]  [phone].  Instructions given to patient as well.  Pt verbalized understanding.

## 2018-09-09 NOTE — Progress Notes (Signed)
I have reviewed this visit and I agree on the patient's plan of dosage and recommendations. Kree Rafter, DO   

## 2018-09-09 NOTE — Telephone Encounter (Signed)
-----   Message from [name], MD sent at 09/08/2018  4:24 PM EDT ----- Regarding: RE: INR Reviewed labs and meds and really don't see anything to explain.  Usually there is something displacing coumadin binding (eg antibiotic) ----- Message ----- From: Warden Fillers, RN Sent: 09/08/2018  12:23 PM EDT To: [name], MD Subject: INR                                            Hi Dr. Elease Hashimoto!  FYI - Patient's INR is continually high regardless of decreasing coumadin dosage.   I have spoken with him and he says he is follow my dosing instructions.  I know that Dr. Marin Olp is checking his labs frequently and I don't really see anything that causes concern.  Would you mind looking at his labs and let me know if you see anything concerning?  Thanks, [employer]

## 2018-09-15 ENCOUNTER — Telehealth: Payer: Self-pay | Admitting: Hematology & Oncology

## 2018-09-15 NOTE — Telephone Encounter (Signed)
Patient's wife called to change port flush date/time from 10/12 to 10/13.

## 2018-09-17 ENCOUNTER — Ambulatory Visit (INDEPENDENT_AMBULATORY_CARE_PROVIDER_SITE_OTHER): Payer: Medicare Other | Admitting: General Practice

## 2018-09-17 DIAGNOSIS — Z7901 Long term (current) use of anticoagulants: Secondary | ICD-10-CM | POA: Diagnosis not present

## 2018-09-17 DIAGNOSIS — I480 Paroxysmal atrial fibrillation: Secondary | ICD-10-CM

## 2018-09-17 LAB — POCT INR: INR: 1.7 — AB (ref 2.0–3.0)

## 2018-09-17 NOTE — Patient Instructions (Signed)
Pre visit review using our clinic review tool, if applicable. No additional management support is needed unless otherwise documented below in the visit note.  Take 1 tablet today and then continue to take 1/2 tablet daily except 1 tablet on Mon Wed and Fridays.  Re-check in 1 week.  Dosing instructions also given to Nancie Neas, RN @ [facility]  [phone].  Instructions given to patient as well.  Pt verbalized understanding.

## 2018-09-24 ENCOUNTER — Ambulatory Visit (INDEPENDENT_AMBULATORY_CARE_PROVIDER_SITE_OTHER): Payer: Medicare Other | Admitting: General Practice

## 2018-09-24 DIAGNOSIS — I480 Paroxysmal atrial fibrillation: Secondary | ICD-10-CM

## 2018-09-24 DIAGNOSIS — Z7901 Long term (current) use of anticoagulants: Secondary | ICD-10-CM

## 2018-09-24 LAB — POCT INR: INR: 1.9 — AB (ref 2.0–3.0)

## 2018-09-24 NOTE — Patient Instructions (Signed)
Pre visit review using our clinic review tool, if applicable. No additional management support is needed unless otherwise documented below in the visit note. 

## 2018-09-26 ENCOUNTER — Other Ambulatory Visit: Payer: Self-pay | Admitting: Family Medicine

## 2018-09-30 ENCOUNTER — Ambulatory Visit: Payer: Medicare Other | Admitting: Physician Assistant

## 2018-10-01 ENCOUNTER — Ambulatory Visit (INDEPENDENT_AMBULATORY_CARE_PROVIDER_SITE_OTHER): Payer: Medicare Other | Admitting: General Practice

## 2018-10-01 DIAGNOSIS — I480 Paroxysmal atrial fibrillation: Secondary | ICD-10-CM

## 2018-10-01 DIAGNOSIS — Z7901 Long term (current) use of anticoagulants: Secondary | ICD-10-CM

## 2018-10-01 LAB — POCT INR: INR: 1.8 — AB (ref 2.0–3.0)

## 2018-10-01 NOTE — Patient Instructions (Signed)
Pre visit review using our clinic review tool, if applicable. No additional management support is needed unless otherwise documented below in the visit note.  Take 1 tablet today and then change dosage and take 1 tablet daily except 1/2 tablet on Sun and Thurs.   Re-check in 2 weeks.  Dosing instructions also given to Olivia Mackie, RN @ [facility]  [phone] while in patient's home.

## 2018-10-21 ENCOUNTER — Other Ambulatory Visit: Payer: Self-pay | Admitting: Family Medicine

## 2018-10-21 NOTE — Progress Notes (Signed)
Virtual Visit via Telephone Note   This visit type was conducted due to national recommendations for restrictions regarding the COVID-19 Pandemic (e.g. social distancing) in an effort to limit this patient's exposure and mitigate transmission in our community.  Due to his co-morbid illnesses, this patient is at least at moderate risk for complications without adequate follow up.  This format is felt to be most appropriate for this patient at this time.  The patient did not have access to video technology/had technical difficulties with video requiring transitioning to audio format only (telephone).  All issues noted in this document were discussed and addressed.  No physical exam could be performed with this format.  Please refer to the patient's chart for his  consent to telehealth for [facility].   Date:  11/03/2018   ID:  Gary Yates., DOB [birth year], MRN [id number]  Patient Location: Home Provider Location: Home  PCP:  [name], MD  Cardiologist:  Lauree Chandler, MD   Electrophysiologist:  None   Evaluation Performed:  Follow-Up Visit  Chief Complaint:  Follow up  History of Present Illness:    Tysheem Accardo. is a 85 y.o. male with history of CAD with remote stenting of the RCA in 2004, cardiac cath 2013 moderate LAD disease with patent RCA stents no flow-limiting lesions, normal LV function, cardiac cath 2015 stable disease started on Imdur and Plavix.  PAF treated with Coumadin due to cost, slurred speech 06/2011 no CVA on CT but question tiny acute infarct of the posterior left frontal lobe mild small vessel disease, mild carotid artery disease, hypertension, HLD, morbid obesity, DM, COPD, non-Hodgkin's lymphoma treated with chemo 02/2017 normal LVEF 55 to 60% with mild aortic stenosis mean gradient 8 mmHg  Last office visit with Dr. Angelena Form 09/2017 Toprol reduced to 50 mg a day due to fatigue and soft blood pressure.  No longer on Plavix or aspirin because  of Coumadin.  Was referred for sleep study but he chose not to keep his appointment.  Told he could take Lasix twice daily if needed for edema.  Patient has lost weight and sugars running much better. Denies chest pain, shortness of breath, dizziness or presyncope. Has HH come in every 2 weeks. Sometimes has a hard time completing his sentences but overall thinks he's doing well. Not driving much because of neuropathy. Has some swelling in legs. Compression stockings help.Taking lasix once daily. W   The patient does not have symptoms concerning for COVID-19 infection (fever, chills, cough, or new shortness of breath).    Past Medical History:  Diagnosis Date  . Anemia in chronic renal disease 05/07/2017  . Anxiety   . Atrial fibrillation ([place])   . COPD (chronic obstructive pulmonary disease) ([place])    pt. denies  . Coronary artery disease    a. h/o Overlapping stents RCA;  b. 06/2011 Cath: patent stents, nonobs dzs, NL EF.  . Diabetic peripheral neuropathy ([place])   . Diffuse non-Hodgkin's lymphoma of testis ([place]) 09/28/2015  . DM (diabetes mellitus) ([place])    Type 2, peripheral neuropathy.  . Dyspnea    with exertion  . Dysrhythmia   . GERD (gastroesophageal reflux disease)   . Headache   . History of bronchitis   . History of kidney stones   . History of radiation therapy 02/19/16 - 03/13/16   Testis/Scrotum: 32.4 Gy in 18 fractions  . History of radiation therapy 08/07/16-08/20/16   left adrenal gland mass treated to 30  Gy in 10 fractions  . Hyperlipidemia   . Hypertension   . Iron deficiency anemia due to chronic blood loss 08/08/2017  . Low testosterone   . Nephrolithiasis   . OSA (obstructive sleep apnea) 11/26/2017  . Osteoarthritis    shoulder  . Restless leg   . SVT (supraventricular tachycardia) ([place])   . Urinary frequency   . Wears partial dentures    upper and lower   Past Surgical History:  Procedure Laterality Date  . APPENDECTOMY    . [place]  .  CARDIAC CATHETERIZATION  01/2013  . CATARACT EXTRACTION, BILATERAL    . CHOLECYSTECTOMY    . COLONOSCOPY    . CORONARY ANGIOPLASTY  2004  . CYSTOSCOPY N/A 08/18/2017   Procedure: CYSTOSCOPY WITH FULGURATION AND SUPRA PUBIC TUBE PLACEMENT;  Surgeon: Kathie Rhodes, MD;  Location: [facility]S;  Service: Urology;  Laterality: N/A;  . EYE SURGERY Bilateral    cataracts  . IR GENERIC HISTORICAL  10/05/2015   IR US GUIDE VASC ACCESS RIGHT 10/05/2015 Marybelle Killings, MD [facility]-INTER[facility] RAD  . IR GENERIC HISTORICAL  10/05/2015   IR FLUORO GUIDE PORT INSERTION RIGHT 10/05/2015 Marybelle Killings, MD [facility]  . LEFT HEART CATHETERIZATION WITH CORONARY ANGIOGRAM N/A 06/18/2011   Procedure: LEFT HEART CATHETERIZATION WITH CORONARY ANGIOGRAM;  Surgeon: Peter M Martinique, MD;  Location: [facility] CATH LAB;  Service: Cardiovascular;  Laterality: N/A;  . LEFT HEART CATHETERIZATION WITH CORONARY ANGIOGRAM N/A 01/27/2013   Procedure: LEFT HEART CATHETERIZATION WITH CORONARY ANGIOGRAM;  Surgeon: Burnell Blanks, MD;  Location: [facility] CATH LAB;  Service: Cardiovascular;  Laterality: N/A;  . LUMBAR LAMINECTOMY/DECOMPRESSION MICRODISCECTOMY N/A 02/07/2015   Procedure: Lumbar three-Sacral one Decompression;  Surgeon: Kevan Ny Ditty, MD;  Location: [facility] NEURO ORS;  Service: Neurosurgery;  Laterality: N/A;  L3 to S1 Decompression  . MULTIPLE TOOTH EXTRACTIONS    . ORCHIECTOMY Right 09/01/2015   Procedure: RIGHT ORCHIECTOMY;  Surgeon: Kathie Rhodes, MD;  Location: [facility];  Service: Urology;  Laterality: Right;  . port a cath in place     . ROTATOR CUFF REPAIR Left      Current Meds  Medication Sig  . acetaminophen (TYLENOL) 500 MG tablet Take 1,000 mg by mouth every 6 (six) hours as needed (for pain/fever/headaches.).   Marland Kitchen atorvastatin (LIPITOR) 40 MG tablet TAKE 1 TABLET BY MOUTH EVERY DAY  . docusate sodium (COLACE) 50 MG capsule Take 1 capsule (50 mg total) by mouth 2 (two) times daily as needed (for constipation).  . furosemide (LASIX) 40  MG tablet Take 1 tablet (40 mg total) by mouth every other day. Take one tablet by mouth every other day.  . gabapentin (NEURONTIN) 300 MG capsule TAKE 2 CAPSULES BY MOUTH 3 TIMES A DAY  . glucose blood (ACCU-CHEK AVIVA PLUS) test strip 1 each by Other route 3 (three) times daily. for testing  . magic mouthwash w/lidocaine SOLN Take 5 mLs by mouth 4 (four) times daily as needed for mouth pain. 1:1 ratio. DIPHENHYDRAMINE HCL (ANTIHISTAMINES - ETHANOLAMINES),ALUM & MAG HYDROXIDE-SIMETH,NYSTATIN (ANTI-INFECTIVES - THROAT),LIDOCAINE HCL (ANESTHETICS TOPICAL ORAL)  . Melatonin 1 MG TABS Take 1 tablet (1 mg total) by mouth at bedtime. (Patient taking differently: Take 5 tablets by mouth at bedtime. )  . metFORMIN (GLUCOPHAGE) 500 MG tablet TAKE 1 TABLET BY MOUTH TWICE A DAY WITH MEALS  . metoprolol succinate (TOPROL-XL) 25 MG 24 hr tablet Take 1 tablet (25 mg total) by mouth daily. Please keep upcoming appt  in September for future refills. Thank you  . Multiple Vitamins-Minerals (MULTIVITAMIN WITH MINERALS) tablet Take 1 tablet by mouth daily.  . nitroGLYCERIN (NITROSTAT) 0.4 MG SL tablet Place 1 tablet (0.4 mg total) under the tongue every 5 (five) minutes as needed. Chest pain  . nystatin (MYCOSTATIN/NYSTOP) powder APPLY TO AFFECTED AREA 4 TIMES A DAY  . nystatin-triamcinolone ointment (MYCOLOG) Apply 1 application topically 2 (two) times daily.  . pantoprazole (PROTONIX) 40 MG tablet TAKE 1 TABLET BY MOUTH EVERY DAY  . polyethylene glycol (MIRALAX / GLYCOLAX) packet Take 17 g by mouth daily.  . pramipexole (MIRAPEX) 1.5 MG tablet TAKE 2 TABLETS BY MOUTH EVERY NIGHT  . tobramycin (TOBREX) 0.3 % ophthalmic solution Place 2 drops into the right eye every 4 (four) hours.  . vitamin B-12 (CYANOCOBALAMIN) 1000 MCG tablet Take 1 tablet (1,000 mcg total) by mouth daily.  Marland Kitchen warfarin (COUMADIN) 5 MG tablet Take 1 tablet daily except take 1 1/2 tablets on "Sundays only or Take as directed by anticoagulation clinic.      Allergies:   Ace inhibitors, Codeine, and Penicillins   Social History   Tobacco Use  . Smoking status: Former Smoker    Packs/day: 1.50    Years: 20.00    Pack years: 30.00    Types: Cigarettes    Quit date: 04/05/1978    Years since quitting: 40.6  . Smokeless tobacco: Never Used  Substance Use Topics  . Alcohol use: No  . Drug use: No     Family Hx: The patient's family history includes Alzheimer's disease in his mother; Arthritis in his brother and sister; Coronary artery disease in some other family members; Heart disease in his brother, father, mother, and sister; Migraines in his daughter and father; Obesity in his sister and son; Prostate cancer in his brother; Sleep apnea in his son; Thyroid disease in his daughter; Ulcers in his father.  ROS:   Please see the history of present illness.      All other systems reviewed and are negative.   Prior CV studies:   The following studies were reviewed today:  2D echo 6/2019Study Conclusions   - Left ventricle: The cavity size was normal. Wall thickness was   normal. Systolic function was normal. The estimated ejection   fraction was in the range of 55% to 60%. There was an increased   relative contribution of atrial contraction to ventricular   filling. Doppler parameters are consistent with abnormal left   ventricular relaxation (grade 1 diastolic dysfunction). - Aortic valve: Moderately thickened, moderately calcified   leaflets. Valve area (VTI): 2.26 cm^2. Valve area (Vmax): 2.02   cm^2. Valve area (Vmean): 2.02 cm^2. - Mitral valve: Calcified annulus. - Atrial septum: No defect or patent foramen ovale was identified. - Tricuspid valve: There was mild regurgitation.   ------------------------------------------------------------------- Study data:  Comparison was made to the study of 09/20/2015.  Study status:  Routine.  Procedure:  The patient reported no pain pre or post test. Transthoracic  echocardiography. Image quality was good. Study completion:  There were no complications. Transthoracic echocardiography.  M-mode, complete 2D, spectral Doppler, and color Doppler.  Birthdate:  Patient birthdate: 08/30/1933.  Age:  Patient is 84 yr old.  Sex:  Gender: male. BMI: 31.5 kg/m^2.  Blood pressure:     11" 6/58  Patient status: Inpatient.  Study date:  Study date: 06/10/2017. Study time: 02:53 PM.  Location:  Bedside.     Labs/Other Tests and Data Reviewed:  EKG:  No ECG reviewed.  Recent Labs: 08/05/2018: ALT 4; BUN 15; Creatinine 1.22; Hemoglobin 9.8; Platelet Count 149; Potassium 3.8; Sodium 139; TSH 4.795   Recent Lipid Panel Lab Results  Component Value Date/Time   CHOL 135 09/15/2017 09:53 AM   TRIG 224 (H) 09/15/2017 09:53 AM   HDL 37 (L) 09/15/2017 09:53 AM   CHOLHDL 3.6 09/15/2017 09:53 AM   CHOLHDL 4 05/10/2015 12:12 PM   LDLCALC 53 09/15/2017 09:53 AM   LDLDIRECT 85.0 12/05/2014 11:26 AM    Wt Readings from Last 3 Encounters:  11/03/18 217 lb (98.4 kg)  10/27/18 227 lb (103 kg)  08/05/18 227 lb (103 kg)     Objective:    Vital Signs:  BP 138/60   Ht 5\' 10"  (1.778 m)   Wt 217 lb (98.4 kg)   BMI 31.14 kg/m    VITAL SIGNS:  reviewed  ASSESSMENT & PLAN:    CAD with remote stenting of the RCA in 2004, cardiac cath 2013 moderate LAD disease with patent RCA stents no flow-limiting lesions, normal LV function.  Last cath 2015 no change. No angina  Chronic diastolic CHF normal LVEF 55 to 60% with grade 1 DD echo 06/2017 on Lasix 40 mg once daily and compression hose. Has occasional swelling. 2 gm sodium diet.  PAF on Coumadin  Essential hypertension BP controlled-HH checks every 2 weeks.  Hyperlipidemia LDL 53 09/2017    COVID-19 Education: The signs and symptoms of COVID-19 were discussed with the patient and how to seek care for testing (follow up with PCP or arrange E-visit).   The importance of social distancing was discussed today.  Time:    Today, I have spent 14:02 minutes with the patient with telehealth technology discussing the above problems.     Medication Adjustments/Labs and Tests Ordered: Current medicines are reviewed at length with the patient today.  Concerns regarding medicines are outlined above.   Tests Ordered: No orders of the defined types were placed in this encounter.   Medication Changes: No orders of the defined types were placed in this encounter.   Follow Up:  Either In Person or Virtual in 6 month(s) Dr. Angelena Form  Signed, Ermalinda Barrios, PA-C  11/03/2018 10:47 AM    [place]

## 2018-10-26 ENCOUNTER — Ambulatory Visit (INDEPENDENT_AMBULATORY_CARE_PROVIDER_SITE_OTHER): Payer: Medicare Other | Admitting: General Practice

## 2018-10-26 DIAGNOSIS — I480 Paroxysmal atrial fibrillation: Secondary | ICD-10-CM

## 2018-10-26 DIAGNOSIS — Z7901 Long term (current) use of anticoagulants: Secondary | ICD-10-CM

## 2018-10-26 LAB — POCT INR: INR: 2.8 (ref 2.0–3.0)

## 2018-10-26 NOTE — Patient Instructions (Signed)
Pre visit review using our clinic review tool, if applicable. No additional management support is needed unless otherwise documented below in the visit note.  Continue to take 1 tablet daily except 1/2 tablet on Sun and Thurs.   Re-check in 2 weeks.  Dosing instructions also given to [place], [place] @ [facility]  [phone]  while in patient's home.

## 2018-10-27 ENCOUNTER — Other Ambulatory Visit: Payer: Self-pay

## 2018-10-27 ENCOUNTER — Inpatient Hospital Stay: Payer: Medicare Other | Attending: Hematology & Oncology

## 2018-10-27 VITALS — BP 132/62 | HR 92 | Temp 97.1°F | Resp 18 | Wt 227.0 lb

## 2018-10-27 DIAGNOSIS — Z9221 Personal history of antineoplastic chemotherapy: Secondary | ICD-10-CM | POA: Insufficient documentation

## 2018-10-27 DIAGNOSIS — D500 Iron deficiency anemia secondary to blood loss (chronic): Secondary | ICD-10-CM

## 2018-10-27 DIAGNOSIS — C8589 Other specified types of non-Hodgkin lymphoma, extranodal and solid organ sites: Secondary | ICD-10-CM | POA: Insufficient documentation

## 2018-10-27 DIAGNOSIS — Z923 Personal history of irradiation: Secondary | ICD-10-CM | POA: Diagnosis not present

## 2018-10-27 DIAGNOSIS — Z452 Encounter for adjustment and management of vascular access device: Secondary | ICD-10-CM | POA: Diagnosis not present

## 2018-10-27 DIAGNOSIS — Z9225 Personal history of immunosupression therapy: Secondary | ICD-10-CM | POA: Insufficient documentation

## 2018-10-27 DIAGNOSIS — D631 Anemia in chronic kidney disease: Secondary | ICD-10-CM

## 2018-10-27 MED ORDER — HEPARIN SOD (PORK) LOCK FLUSH 100 UNIT/ML IV SOLN
500.0000 [IU] | Freq: Once | INTRAVENOUS | Status: AC | PRN
  Administered 2018-10-27: 500 [IU]
  Filled 2018-10-27: qty 5

## 2018-10-27 MED ORDER — SODIUM CHLORIDE 0.9% FLUSH
10.0000 mL | Freq: Once | INTRAVENOUS | Status: AC | PRN
  Administered 2018-10-27: 10 mL
  Filled 2018-10-27: qty 10

## 2018-11-02 ENCOUNTER — Telehealth: Payer: Self-pay

## 2018-11-02 NOTE — Telephone Encounter (Signed)
Attempted to call and confirm pt appt but no answer

## 2018-11-03 ENCOUNTER — Other Ambulatory Visit: Payer: Self-pay

## 2018-11-03 ENCOUNTER — Encounter: Payer: Self-pay | Admitting: Physician Assistant

## 2018-11-03 ENCOUNTER — Telehealth (INDEPENDENT_AMBULATORY_CARE_PROVIDER_SITE_OTHER): Payer: Medicare Other | Admitting: Physician Assistant

## 2018-11-03 VITALS — BP 138/60 | Ht 70.0 in | Wt 217.0 lb

## 2018-11-03 DIAGNOSIS — I2510 Atherosclerotic heart disease of native coronary artery without angina pectoris: Secondary | ICD-10-CM

## 2018-11-03 DIAGNOSIS — I480 Paroxysmal atrial fibrillation: Secondary | ICD-10-CM

## 2018-11-03 DIAGNOSIS — I10 Essential (primary) hypertension: Secondary | ICD-10-CM

## 2018-11-03 DIAGNOSIS — I5032 Chronic diastolic (congestive) heart failure: Secondary | ICD-10-CM | POA: Diagnosis not present

## 2018-11-03 DIAGNOSIS — E785 Hyperlipidemia, unspecified: Secondary | ICD-10-CM

## 2018-11-03 DIAGNOSIS — I110 Hypertensive heart disease with heart failure: Secondary | ICD-10-CM

## 2018-11-03 NOTE — Patient Instructions (Signed)
Your physician recommends that you continue on your current medications as directed. Please refer to the Current Medication list given to you today.  Your physician wants you to follow-up in: [place] will receive a reminder letter in the mail two months in advance. If you don't receive a letter, please call our office to schedule the follow-up appointment.   Two Gram Sodium Diet 2000 mg  What is Sodium? Sodium is a mineral found naturally in many foods. The most significant source of sodium in the diet is table salt, which is about 40% sodium.  Processed, convenience, and preserved foods also contain a large amount of sodium.  The body needs only 500 mg of sodium daily to function,  A normal diet provides more than enough sodium even if you do not use salt.  Why Limit Sodium? A build up of sodium in the body can cause thirst, increased blood pressure, shortness of breath, and water retention.  Decreasing sodium in the diet can reduce edema and risk of heart attack or stroke associated with high blood pressure.  Keep in mind that there are many other factors involved in these health problems.  Heredity, obesity, lack of exercise, cigarette smoking, stress and what you eat all play a role.  General Guidelines:  Do not add salt at the table or in cooking.  One teaspoon of salt contains over 2 grams of sodium.  Read food labels  Avoid processed and convenience foods  Ask your dietitian before eating any foods not dicussed in the menu planning guidelines  Consult your physician if you wish to use a salt substitute or a sodium containing medication such as antacids.  Limit milk and milk products to 16 oz (2 cups) per day.  Shopping Hints:  READ LABELS!! "Dietetic" does not necessarily mean low sodium.  Salt and other sodium ingredients are often added to foods during processing.   Menu Planning Guidelines Food Group Choose More Often Avoid  Beverages (see also the  milk group All fruit juices, low-sodium, salt-free vegetables juices, low-sodium carbonated beverages Regular vegetable or tomato juices, commercially softened water used for drinking or cooking  Breads and Cereals Enriched white, wheat, rye and pumpernickel bread, hard rolls and dinner rolls; muffins, cornbread and waffles; most dry cereals, cooked cereal without added salt; unsalted crackers and breadsticks; low sodium or homemade bread crumbs Bread, rolls and crackers with salted tops; quick breads; instant hot cereals; pancakes; commercial bread stuffing; self-rising flower and biscuit mixes; regular bread crumbs or cracker crumbs  Desserts and Sweets Desserts and sweets mad with mild should be within allowance Instant pudding mixes and cake mixes  Fats Butter or margarine; vegetable oils; unsalted salad dressings, regular salad dressings limited to 1 Tbs; light, sour and heavy cream Regular salad dressings containing bacon fat, bacon bits, and salt pork; snack dips made with instant soup mixes or processed cheese; salted nuts  Fruits Most fresh, frozen and canned fruits Fruits processed with salt or sodium-containing ingredient (some dried fruits are processed with sodium sulfites        Vegetables Fresh, frozen vegetables and low- sodium canned vegetables Regular canned vegetables, sauerkraut, pickled vegetables, and others prepared in brine; frozen vegetables in sauces; vegetables seasoned with ham, bacon or salt pork  Condiments, Sauces, Miscellaneous  Salt substitute with physician's approval; pepper, herbs, spices; vinegar, lemon or lime juice; hot pepper sauce; garlic powder, onion powder, low sodium soy sauce (1 Tbs.); low sodium condiments (ketchup, chili  sauce, mustard) in limited amounts (1 tsp.) fresh ground horseradish; unsalted tortilla chips, pretzels, potato chips, popcorn, salsa (1/4 cup) Any seasoning made with salt including garlic salt, celery salt, onion salt, and seasoned salt;  sea salt, rock salt, kosher salt; meat tenderizers; monosodium glutamate; mustard, regular soy sauce, barbecue, sauce, chili sauce, teriyaki sauce, steak sauce, Worcestershire sauce, and most flavored vinegars; canned gravy and mixes; regular condiments; salted snack foods, olives, picles, relish, horseradish sauce, catsup   Food preparation: Try these seasonings Meats:    Pork Sage, onion Serve with applesauce  Chicken Poultry seasoning, thyme, parsley Serve with cranberry sauce  Lamb Curry powder, rosemary, garlic, thyme Serve with mint sauce or jelly  Veal Marjoram, basil Serve with current jelly, cranberry sauce  Beef Pepper, bay leaf Serve with dry mustard, unsalted chive butter  Fish [place] leaf, dill Serve with unsalted lemon butter, unsalted parsley butter  Vegetables:    Asparagus Lemon juice   Broccoli Lemon juice   Carrots Mustard dressing parsley, mint, nutmeg, glazed with unsalted butter and sugar   Green beans Marjoram, lemon juice, nutmeg,dill seed   Tomatoes Basil, marjoram, onion   Spice /blend for [employer]" 4 tsp ground thyme 1 tsp ground sage 3 tsp ground rosemary 4 tsp ground marjoram   Test your knowledge 1. A product that says "Salt Free" may still contain sodium. True or False 2. Garlic Powder and Hot Pepper Sauce an be used as alternative seasonings.True or False 3. Processed foods have more sodium than fresh foods.  True or False 4. Canned Vegetables have less sodium than froze True or False  WAYS TO DECREASE YOUR SODIUM INTAKE 1. Avoid the use of added salt in cooking and at the table.  Table salt (and other prepared seasonings which contain salt) is probably one of the greatest sources of sodium in the diet.  Unsalted foods can gain flavor from the sweet, sour, and butter taste sensations of herbs and spices.  Instead of using salt for seasoning, try the following seasonings with the foods listed.  Remember: how you use them to enhance natural food flavors is  limited only by your creativity... Allspice-Meat, fish, eggs, fruit, peas, red and yellow vegetables Almond Extract-Fruit baked goods Anise Seed-Sweet breads, fruit, carrots, beets, cottage cheese, cookies (tastes like licorice) Basil-Meat, fish, eggs, vegetables, rice, vegetables salads, soups, sauces Bay Leaf-Meat, fish, stews, poultry Burnet-Salad, vegetables (cucumber-like flavor) Caraway Seed-Bread, cookies, cottage cheese, meat, vegetables, cheese, rice Cardamon-Baked goods, fruit, soups Celery Powder or seed-Salads, salad dressings, sauces, meatloaf, soup, bread.Do not use  celery salt Chervil-Meats, salads, fish, eggs, vegetables, cottage cheese (parsley-like flavor) Chili Power-Meatloaf, chicken cheese, corn, eggplant, egg dishes Chives-Salads cottage cheese, egg dishes, soups, vegetables, sauces Cilantro-Salsa, casseroles Cinnamon-Baked goods, fruit, pork, lamb, chicken, carrots Cloves-Fruit, baked goods, fish, pot roast, green beans, beets, carrots Coriander-Pastry, cookies, meat, salads, cheese (lemon-orange flavor) Cumin-Meatloaf, fish,cheese, eggs, cabbage,fruit pie (caraway flavor) [employer], fruit, eggs, fish, poultry, cottage cheese, vegetables Dill Seed-Meat, cottage cheese, poultry, vegetables, fish, salads, bread Fennel Seed-Bread, cookies, apples, pork, eggs, fish, beets, cabbage, cheese, Licorice-like flavor Garlic-(buds or powder) Salads, meat, poultry, fish, bread, butter, vegetables, potatoes.Do not  use garlic salt Ginger-Fruit, vegetables, baked goods, meat, fish, poultry Horseradish Root-Meet, vegetables, butter Lemon Juice or Extract-Vegetables, fruit, tea, baked goods, fish salads Mace-Baked goods fruit, vegetables, fish, poultry (taste like nutmeg) Maple Extract-Syrups Marjoram-Meat, chicken, fish, vegetables, breads, green salads (taste like Sage) Mint-Tea, lamb, sherbet, vegetables, desserts, carrots, cabbage Mustard, Dry or Seed-Cheese, eggs,  meats, vegetables, poultry Nutmeg-Baked goods, fruit, chicken, eggs, vegetables, desserts Onion Powder-Meat, fish, poultry, vegetables, cheese, eggs, bread, rice salads (Do not use   Onion salt) Orange Extract-Desserts, baked goods Oregano-Pasta, eggs, cheese, onions, pork, lamb, fish, chicken, vegetables, green salads Paprika-Meat, fish, poultry, eggs, cheese, vegetables Parsley Flakes-Butter, vegetables, meat fish, poultry, eggs, bread, salads (certain forms may   Contain sodium Pepper-Meat fish, poultry, vegetables, eggs Peppermint Extract-Desserts, baked goods Poppy Seed-Eggs, bread, cheese, fruit dressings, baked goods, noodles, vegetables, cottage  [employer], poultry, meat, fish, cauliflower, turnips,eggs bread Saffron-Rice, bread, veal, chicken, fish, eggs Sage-Meat, fish, poultry, onions, eggplant, tomateos, pork, stews Savory-Eggs, salads, poultry, meat, rice, vegetables, soups, pork Tarragon-Meat, poultry, fish, eggs, butter, vegetables (licorice-like flavor)  Thyme-Meat, poultry, fish, eggs, vegetables, (clover-like flavor), sauces, soups Tumeric-Salads, butter, eggs, fish, rice, vegetables (saffron-like flavor) Vanilla Extract-Baked goods, candy Vinegar-Salads, vegetables, meat marinades Walnut Extract-baked goods, candy  2. Choose your Foods Wisely   The following is a list of foods to avoid which are high in sodium:  Meats-Avoid all smoked, canned, salt cured, dried and kosher meat and fish as well as Anchovies   [employer] meats:Bologna, Liverwurst, Pastrami Canned meat or fish  Marinated herring Caviar    Pepperoni Corned Beef   Pizza Dried chipped beef  Salami Frozen breaded fish or meat Salt pork Frankfurters or hot dogs  Sardines Gefilte fish   Sausage Ham (boiled ham, Proscuitto Smoked butt    spiced ham)   Spam      TV Dinners Vegetables Canned vegetables (Regular) Relish Canned  mushrooms  Sauerkraut Olives    Tomato juice Pickles  Bakery and Dessert Products Canned puddings  Cream pies Cheesecake   Decorated cakes Cookies  Beverages/Juices Tomato juice, regular  Gatorade   V-8 vegetable juice, regular  Breads and Cereals Biscuit mixes   Salted potato chips, corn chips, pretzels Bread stuffing mixes  Salted crackers and rolls Pancake and waffle mixes Self-rising flour  Seasonings Accent    Meat sauces Barbecue sauce  Meat tenderizer Catsup    Monosodium glutamate (MSG) Celery salt   Onion salt Chili sauce   Prepared mustard Garlic salt   Salt, seasoned salt, sea salt Gravy mixes   Soy sauce Horseradish   Steak sauce Ketchup   Tartar sauce Lite salt    Teriyaki sauce Marinade mixes   Worcestershire sauce  Others Baking powder   Cocoa and cocoa mixes Baking soda   Commercial casserole mixes Candy-caramels, chocolate  Dehydrated soups    Bars, fudge,nougats  Instant rice and pasta mixes Canned broth or soup  Maraschino cherries Cheese, aged and processed cheese and cheese spreads  Learning Assessment Quiz  Indicated T (for True) or F (for False) for each of the following statements:  1. _____ Fresh fruits and vegetables and unprocessed grains are generally low in sodium 2. _____ Water may contain a considerable amount of sodium, depending on the source 3. _____ You can always tell if a food is high in sodium by tasting it 4. _____ Certain laxatives my be high in sodium and should be avoided unless prescribed   by a physician or pharmacist 5. _____ Salt substitutes may be used freely by anyone on a sodium restricted diet 6. _____ Sodium is present in table salt, food additives and as a natural component of   most foods 7. _____ Table salt is approximately 90% sodium 8. _____ Limiting sodium intake may help prevent excess fluid accumulation in the  body 9. _____ On a sodium-restricted diet, seasonings such as bouillon soy sauce, and    cooking  wine should be used in place of table salt 10. _____ On an ingredient list, a product which lists monosodium glutamate as the first   ingredient is an appropriate food to include on a low sodium diet  Circle the best answer(s) to the following statements (Hint: there may be more than one correct answer)  11. On a low-sodium diet, some acceptable snack items are:    A. Olives  F. Bean dip   K. Grapefruit juice    B. Salted Pretzels G. Commercial Popcorn   L. Canned peaches    C. Carrot Sticks  H. Bouillon   M. Unsalted nuts   D. [place] fries  I. Peanut butter crackers N. Salami   E. Sweet pickles J. Tomato Juice   O. Pizza  12.  Seasonings that may be used freely on a reduced - sodium diet include   A. Lemon wedges F.Monosodium glutamate K. Celery seed    B.Soysauce   G. Pepper   L. Mustard powder   C. Sea salt  H. Cooking wine  M. Onion flakes   D. Vinegar  E. Prepared horseradish N. Salsa   E. Sage   J. Worcestershire sauce  O. Chutney

## 2018-11-12 ENCOUNTER — Telehealth: Payer: Self-pay

## 2018-11-12 LAB — POCT INR: INR: 2 (ref 2.0–3.0)

## 2018-11-12 NOTE — Telephone Encounter (Signed)
Copied from [place] [id number]. Topic: General - Other >> Nov 12, 2018  9:16 AM Virl Axe D wrote: Reason for CRM: Mo with [facility] stated pt's INR was 2.0. Please advise.

## 2018-11-12 NOTE — Telephone Encounter (Signed)
Mo called checking status of a call back.  Mo also is requesting verbal orders  1x 9 weeks for skill nursing  Mo call back [phone]

## 2018-11-13 NOTE — Telephone Encounter (Signed)
Mo is calling checking status on all message below Mo call back [phone]

## 2018-11-13 NOTE — Telephone Encounter (Signed)
I apologize Epic kicked me out my last message I was trying to send. This is a [name] pt, usually we would send the INR reading to the comadin nurse but she is on vacation until Monday. Mo from [facility] reported pt's INR at 2.0. Is it ok to inform pt to keep same dosing until able to address next week

## 2018-11-13 NOTE — Telephone Encounter (Signed)
Left detailed message on machine for patient to continue his coumadin at the same dosing.

## 2018-11-13 NOTE — Telephone Encounter (Signed)
Yes thanks 

## 2018-11-16 ENCOUNTER — Ambulatory Visit (INDEPENDENT_AMBULATORY_CARE_PROVIDER_SITE_OTHER): Payer: Medicare Other | Admitting: General Practice

## 2018-11-16 DIAGNOSIS — Z7901 Long term (current) use of anticoagulants: Secondary | ICD-10-CM | POA: Diagnosis not present

## 2018-11-16 NOTE — Telephone Encounter (Signed)
Dr. 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 Message was sent to Dr. Ethlyn Gallery who advised pt to continue same dose until next week when it could be addressed.

## 2018-11-16 NOTE — Patient Instructions (Addendum)
Pre visit review using our clinic review tool, if applicable. No additional management support is needed unless otherwise documented below in the visit note.  Coumadin RN out of office.  VO orders given per Dr. Elease Hashimoto to keep pt on current dosage of 5 mg daily except 2.5 mg on Sun and Thurs and re-check in 2weeks.  No charges for this note.   Dosing instructions given to [facility], RN @ [facility] today 11/9.  Rn states that she did not receive a call from the office last week.

## 2018-11-16 NOTE — Telephone Encounter (Signed)
Ok to give verbal orders as requested.   Would continue same dose coumadin and repeat INR in one month- I believe he has standing orders already for that.

## 2018-11-16 NOTE — Telephone Encounter (Signed)
Called Gary Yates and informed her of Dr. Erick Blinks message. She understood and had no additional questions at this time. Nothing further is needed.

## 2018-11-22 ENCOUNTER — Other Ambulatory Visit: Payer: Self-pay | Admitting: Cardiovascular Disease

## 2018-11-25 ENCOUNTER — Other Ambulatory Visit: Payer: Self-pay | Admitting: Family Medicine

## 2018-11-26 ENCOUNTER — Telehealth: Payer: Self-pay

## 2018-11-26 NOTE — Telephone Encounter (Signed)
Kathlee Nations is calling to check back to receive Dr. Anastasio Auerbach' recommendation. Kathlee Nations would like to know does she need to continue the same dosage of coumadin? And when does the INR need to be rechecked?  CB- [phone]

## 2018-11-26 NOTE — Telephone Encounter (Signed)
Kathlee Nations with [facility] calling with pt.'s INR - 2.4 . Reports he would like to try eating one serving a week of food high in Vitamin K. Kathlee Nations asking if they should check his INR more frequently with this. Please let her know - contact number [phone].

## 2018-11-26 NOTE — Telephone Encounter (Signed)
Dr. [name] please advise 

## 2018-11-27 ENCOUNTER — Telehealth: Payer: Self-pay | Admitting: *Deleted

## 2018-11-27 ENCOUNTER — Ambulatory Visit (INDEPENDENT_AMBULATORY_CARE_PROVIDER_SITE_OTHER): Payer: Medicare Other | Admitting: General Practice

## 2018-11-27 DIAGNOSIS — I480 Paroxysmal atrial fibrillation: Secondary | ICD-10-CM

## 2018-11-27 DIAGNOSIS — Z7901 Long term (current) use of anticoagulants: Secondary | ICD-10-CM | POA: Diagnosis not present

## 2018-11-27 LAB — POCT INR: INR: 2.4 (ref 2.0–3.0)

## 2018-11-27 NOTE — Telephone Encounter (Signed)
Copied from [place] [id number]. Topic: General - Other >> Nov 27, 2018 12:24 PM Celene Kras wrote: Reason for CRM: Pt is needing a new social security card. Pt states he does not have a copy of  the birth certificate. Pt is needing a letter stating his full name and that he is a patient. Signed and dated. Please advise.

## 2018-11-27 NOTE — Patient Instructions (Addendum)
Pre visit review using our clinic review tool, if applicable. No additional management support is needed unless otherwise documented below in the visit note.  Continue to take 5 mg daily except 2.5 mg on Sun and Thurs and re-check in 2 weeks.  No charges for this note.   Dosing instructions given to Kathlee Nations, RN @ [facility].  [phone].

## 2018-11-27 NOTE — Telephone Encounter (Signed)
This needs to be addressed by [facility].

## 2018-11-27 NOTE — Progress Notes (Signed)
Medical screening examination/treatment/procedure(s) were performed by non-physician practitioner and as supervising physician I was immediately available for consultation/collaboration. I agree with above. James John, MD   

## 2018-11-29 ENCOUNTER — Other Ambulatory Visit: Payer: Self-pay | Admitting: Family Medicine

## 2018-11-30 ENCOUNTER — Other Ambulatory Visit: Payer: Self-pay | Admitting: Family

## 2018-11-30 ENCOUNTER — Telehealth: Payer: Self-pay

## 2018-11-30 ENCOUNTER — Encounter: Payer: Self-pay | Admitting: Family Medicine

## 2018-11-30 NOTE — Telephone Encounter (Signed)
Called patient and LMOVM to return call  [place] for [facility] to Discuss results / PCP / recommendations / Schedule patient  I left a detailed voice message for Kathlee Nations with [facility] that Dr. Elease Hashimoto gave the OK for patient to use a # 16 catheter.  CRM Created.

## 2018-11-30 NOTE — Telephone Encounter (Signed)
[name], Alinda Sierras, MD  Anibal Henderson, CMA        OK to use a # 16 cath.   Previous Messages  ----- Message -----  From: Anibal Henderson, CMA  Sent: 11/30/2018  7:48 AM EST  To: [name], MD  Subject: FW: Order for home health             Please see message. Please advise.  ----- Message -----  From: Warden Fillers, RN  Sent: 11/27/2018  9:28 AM EST  To: Anibal Henderson, CMA  Subject: Order for home health               Hi Patriciaann Clan, RN @ [facility] called. She said that patient's supra-pubic catheter is leaking. She says that he usually has a 18 french catheter but she wants to know if she can use a 16.  Could you check with Dr. Elease Hashimoto and call her please? Her number is [phone].   Thank you!  Villa Herb, RN

## 2018-11-30 NOTE — Telephone Encounter (Signed)
Letter done

## 2018-11-30 NOTE — Telephone Encounter (Signed)
Called son [name] and let him know the letter is ready for pick up at the front desk. [name] verbalized an understanding and stated that he will come and get today or tomorrow.

## 2018-12-02 ENCOUNTER — Telehealth: Payer: Self-pay

## 2018-12-02 NOTE — Telephone Encounter (Signed)
I have placed the letter in the mail for the patient.

## 2018-12-02 NOTE — Telephone Encounter (Signed)
Copied from [place] [id number]. Topic: General - Other >> Dec 02, 2018  9:34 AM Oneta Rack wrote: Patient would like 11/30/2018 Dr. Note mailed to home, patient would like letter sent out today. Patient not feeling well and unable to pick letter up.

## 2018-12-08 ENCOUNTER — Ambulatory Visit (INDEPENDENT_AMBULATORY_CARE_PROVIDER_SITE_OTHER): Payer: Medicare Other | Admitting: General Practice

## 2018-12-08 DIAGNOSIS — Z7901 Long term (current) use of anticoagulants: Secondary | ICD-10-CM | POA: Diagnosis not present

## 2018-12-08 LAB — POCT INR: INR: 1.7 — AB (ref 2.0–3.0)

## 2018-12-08 NOTE — Patient Instructions (Signed)
Pre visit review using our clinic review tool, if applicable. No additional management support is needed unless otherwise documented below in the visit note.  Please take 7.5 mg today and then continue to take 5 mg daily except 2.5 mg on Sun and Thurs and re-check in 2 weeks.  No charges for this note.   Dosing instructions given to [place], Dustin @ [facility].  [phone].

## 2018-12-09 ENCOUNTER — Other Ambulatory Visit: Payer: Self-pay

## 2018-12-09 ENCOUNTER — Inpatient Hospital Stay: Payer: Medicare Other

## 2018-12-09 ENCOUNTER — Inpatient Hospital Stay: Payer: Medicare Other | Attending: Hematology & Oncology | Admitting: Hematology & Oncology

## 2018-12-09 ENCOUNTER — Encounter: Payer: Self-pay | Admitting: Hematology & Oncology

## 2018-12-09 VITALS — BP 110/59 | HR 80 | Temp 97.6°F | Resp 19 | Wt 219.0 lb

## 2018-12-09 DIAGNOSIS — C8589 Other specified types of non-Hodgkin lymphoma, extranodal and solid organ sites: Secondary | ICD-10-CM | POA: Insufficient documentation

## 2018-12-09 DIAGNOSIS — Z7984 Long term (current) use of oral hypoglycemic drugs: Secondary | ICD-10-CM | POA: Diagnosis not present

## 2018-12-09 DIAGNOSIS — H538 Other visual disturbances: Secondary | ICD-10-CM | POA: Diagnosis not present

## 2018-12-09 DIAGNOSIS — R531 Weakness: Secondary | ICD-10-CM | POA: Diagnosis not present

## 2018-12-09 DIAGNOSIS — J3489 Other specified disorders of nose and nasal sinuses: Secondary | ICD-10-CM | POA: Diagnosis not present

## 2018-12-09 DIAGNOSIS — R634 Abnormal weight loss: Secondary | ICD-10-CM | POA: Insufficient documentation

## 2018-12-09 DIAGNOSIS — D631 Anemia in chronic kidney disease: Secondary | ICD-10-CM | POA: Diagnosis not present

## 2018-12-09 DIAGNOSIS — Z79899 Other long term (current) drug therapy: Secondary | ICD-10-CM | POA: Diagnosis not present

## 2018-12-09 DIAGNOSIS — Z9225 Personal history of immunosupression therapy: Secondary | ICD-10-CM | POA: Insufficient documentation

## 2018-12-09 DIAGNOSIS — N184 Chronic kidney disease, stage 4 (severe): Secondary | ICD-10-CM

## 2018-12-09 DIAGNOSIS — D500 Iron deficiency anemia secondary to blood loss (chronic): Secondary | ICD-10-CM | POA: Insufficient documentation

## 2018-12-09 DIAGNOSIS — E1142 Type 2 diabetes mellitus with diabetic polyneuropathy: Secondary | ICD-10-CM

## 2018-12-09 DIAGNOSIS — N183 Chronic kidney disease, stage 3 unspecified: Secondary | ICD-10-CM | POA: Insufficient documentation

## 2018-12-09 DIAGNOSIS — Z9221 Personal history of antineoplastic chemotherapy: Secondary | ICD-10-CM | POA: Insufficient documentation

## 2018-12-09 DIAGNOSIS — R600 Localized edema: Secondary | ICD-10-CM | POA: Diagnosis not present

## 2018-12-09 DIAGNOSIS — Z7901 Long term (current) use of anticoagulants: Secondary | ICD-10-CM | POA: Diagnosis not present

## 2018-12-09 DIAGNOSIS — N1831 Chronic kidney disease, stage 3a: Secondary | ICD-10-CM | POA: Diagnosis not present

## 2018-12-09 DIAGNOSIS — M549 Dorsalgia, unspecified: Secondary | ICD-10-CM | POA: Insufficient documentation

## 2018-12-09 DIAGNOSIS — Z923 Personal history of irradiation: Secondary | ICD-10-CM | POA: Insufficient documentation

## 2018-12-09 LAB — CMP (CANCER CENTER ONLY)
ALT: 8 U/L (ref 0–44)
AST: 18 U/L (ref 15–41)
Albumin: 4.1 g/dL (ref 3.5–5.0)
Alkaline Phosphatase: 79 U/L (ref 38–126)
Anion gap: 7 (ref 5–15)
BUN: 25 mg/dL — ABNORMAL HIGH (ref 8–23)
CO2: 28 mmol/L (ref 22–32)
Calcium: 9.2 mg/dL (ref 8.9–10.3)
Chloride: 101 mmol/L (ref 98–111)
Creatinine: 1.19 mg/dL (ref 0.61–1.24)
GFR, Est AFR Am: >60 mL/min (ref >60)
GFR, Estimated: 56 mL/min — ABNORMAL LOW (ref >60)
Glucose, Bld: 128 mg/dL — ABNORMAL HIGH (ref 70–99)
Potassium: 4 mmol/L (ref 3.5–5.1)
Sodium: 136 mmol/L (ref 135–145)
Total Bilirubin: 0.4 mg/dL (ref 0.3–1.2)
Total Protein: 7.2 g/dL (ref 6.5–8.1)

## 2018-12-09 LAB — CBC WITH DIFFERENTIAL (CANCER CENTER ONLY)
Abs Immature Granulocytes: 0.02 10*3/uL (ref 0.00–0.07)
Basophils Absolute: 0 10*3/uL (ref 0.0–0.1)
Basophils Relative: 1 %
Eosinophils Absolute: 0.1 10*3/uL (ref 0.0–0.5)
Eosinophils Relative: 3 %
HCT: 29.9 % — ABNORMAL LOW (ref 39.0–52.0)
Hemoglobin: 9.8 g/dL — ABNORMAL LOW (ref 13.0–17.0)
Immature Granulocytes: 0 %
Lymphocytes Relative: 27 %
Lymphs Abs: 1.5 10*3/uL (ref 0.7–4.0)
MCH: 31.9 pg (ref 26.0–34.0)
MCHC: 32.8 g/dL (ref 30.0–36.0)
MCV: 97.4 fL (ref 80.0–100.0)
Monocytes Absolute: 0.5 10*3/uL (ref 0.1–1.0)
Monocytes Relative: 8 %
Neutro Abs: 3.4 10*3/uL (ref 1.7–7.7)
Neutrophils Relative %: 61 %
Platelet Count: 154 10*3/uL (ref 150–400)
RBC: 3.07 MIL/uL — ABNORMAL LOW (ref 4.22–5.81)
RDW: 14.2 % (ref 11.5–15.5)
WBC Count: 5.6 10*3/uL (ref 4.0–10.5)
nRBC: 0 % (ref 0.0–0.2)

## 2018-12-09 LAB — PROTIME-INR
INR: 2 — ABNORMAL HIGH (ref 0.8–1.2)
Prothrombin Time: 22.4 seconds — ABNORMAL HIGH (ref 11.4–15.2)

## 2018-12-09 MED ORDER — EPOETIN ALFA-EPBX 40000 UNIT/ML IJ SOLN
40000.0000 [IU] | Freq: Once | INTRAMUSCULAR | Status: AC
  Administered 2018-12-09: 40000 [IU] via SUBCUTANEOUS

## 2018-12-09 MED ORDER — EPOETIN ALFA-EPBX 40000 UNIT/ML IJ SOLN
INTRAMUSCULAR | Status: AC
  Filled 2018-12-09: qty 1

## 2018-12-09 NOTE — Progress Notes (Signed)
Hematology and Oncology Follow Up Visit  Gary Yates [id number] 05-07-1933 85 y.o. 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  He actually is doing quite well.  He says he feels pretty good.  He is losing weight.  Hopefully, this is from his diuretic protocol.  Despite the coronavirus, he really has not had much in the way of difficulties.  He does have chronic back issues.  He says this has not caused any further problems for him.  He has heart issues.  He is on Coumadin.  He has had no bleeding.  He has had no issues with heart failure.  There is been no nausea or vomiting.  He has chronic leg swelling.  He does not sleep all that well.  His last PET scan was done back in November 2019.  This did not show any evidence of recurrent lymphoma.  Overall, his performance status is ECOG 2-3.    Medications:  Current Outpatient Medications:  .  acetaminophen (TYLENOL) 500 MG tablet, Take 1,000 mg by mouth every 6 (six) hours as needed (for pain/fever/headaches.). , Disp: , Rfl:  .  atorvastatin (LIPITOR) 40 MG tablet, TAKE 1 TABLET BY MOUTH EVERY DAY, Disp: 30 tablet, Rfl: 0 .  furosemide (LASIX) 40 MG tablet, Take 1 tablet (40 mg total) by mouth every other day. Take one tablet by mouth every other day., Disp: 45 tablet, Rfl: 1 .  gabapentin (NEURONTIN) 300 MG capsule, TAKE 2 CAPSULES BY MOUTH 3 TIMES A DAY (Patient taking differently: 900 mg 2 (two) times daily. ), Disp: 540  capsule, Rfl: 3 .  glucose blood (ACCU-CHEK AVIVA PLUS) test strip, 1 each by Other route 3 (three) times daily. for testing, Disp: 300 each, Rfl: 1 .  Melatonin 1 MG TABS, Take 1 tablet (1 mg total) by mouth at bedtime. (Patient taking differently: Take 5 tablets by mouth at bedtime. ), Disp: 30 tablet, Rfl: 2 .  metFORMIN (GLUCOPHAGE) 500 MG tablet, TAKE 1 TABLET BY MOUTH TWICE A DAY WITH MEALS, Disp: 180 tablet, Rfl: 2 .  metoprolol succinate (TOPROL-XL) 25 MG 24 hr tablet, Take 1 tablet (25 mg total) by mouth daily., Disp: 90 tablet, Rfl: 3 .  nystatin (MYCOSTATIN/NYSTOP) powder, APPLY TO AFFECTED AREA 4 TIMES A DAY, Disp: 60 g, Rfl: 0 .  nystatin-triamcinolone ointment (MYCOLOG), Apply 1 application topically 2 (two) times daily., Disp: 15 g, Rfl: 0 .  pantoprazole (PROTONIX) 40 MG tablet, TAKE 1 TABLET BY MOUTH EVERY DAY, Disp: 90 tablet, Rfl: 3 .  polyethylene glycol (MIRALAX / GLYCOLAX) packet, Take 17 g by mouth daily., Disp: 14 each, Rfl: 0 .  pramipexole (MIRAPEX) 1.5 MG tablet, TAKE 2 TABLETS BY MOUTH EVERY NIGHT, Disp: 180 tablet, Rfl: 1 .  warfarin (COUMADIN) 5 MG tablet, Take 1 tablet daily except take 1 1/2 tablets on Sundays only or Take as  directed by anticoagulation clinic., Disp: 100 tablet, Rfl: 1 .  docusate sodium (COLACE) 50 MG capsule, Take 1 capsule (50 mg total) by mouth 2 (two) times daily as needed (for constipation). (Patient not taking: Reported on 12/09/2018), Disp: 90 capsule, Rfl: 0 .  nitroGLYCERIN (NITROSTAT) 0.4 MG SL tablet, Place 1 tablet (0.4 mg total) under the tongue every 5 (five) minutes as needed. Chest pain (Patient not taking: Reported on 12/09/2018), Disp: 25 tablet, Rfl: 6 No current facility-administered medications for this visit.   Facility-Administered Medications Ordered in Other Visits:  .  acetaminophen (TYLENOL) tablet 650 mg, 650 mg, Oral, Once, [place], Sarah M, NP .  pegfilgrastim (NEULASTA ONPRO KIT) injection 6 mg, 6 mg, Subcutaneous,  Once, [place]never, Peter R, MD .  sodium chloride flush (NS) 0.9 % injection 10 mL, 10 mL, Intracatheter, PRN, Volanda Napoleon, MD, 10 mL at 02/27/17 1503 .  sodium chloride flush (NS) 0.9 % injection 10 mL, 10 mL, Intravenous, PRN, Volanda Napoleon, MD, 10 mL at 05/07/17 1950  Allergies:  Allergies  Allergen Reactions  . Ace Inhibitors Other (See Comments)    cough  . Codeine Nausea Only and Rash       . Penicillins Rash    Childhood allergy Has patient had a PCN reaction causing immediate rash, facial/tongue/throat swelling, SOB or lightheadedness with hypotension: Yes Has patient had a PCN reaction causing severe rash involving mucus membranes or skin necrosis: Yes Has patient had a PCN reaction that required hospitalization No Has patient had a PCN reaction occurring within the last 10 years: No If all of the above answers are "NO", then may proceed with Cephalosporin use.     Past Medical History, Surgical history, Social history, and Family History were reviewed and updated.  Review of Systems: Review of Systems  HENT: Positive for sinus pain and sore throat.   Eyes: Positive for blurred vision.  Respiratory: Positive for wheezing.   Cardiovascular: Positive for leg swelling.  Gastrointestinal: Negative.   Genitourinary: Negative.   Musculoskeletal: Positive for back pain.  Skin: Negative.   Neurological: Positive for weakness.  Endo/Heme/Allergies: Negative.   Psychiatric/Behavioral: Negative.     Physical Exam:  weight is 219 lb (99.3 kg). His oral temperature is 97.6 F (36.4 C). His blood pressure is 110/59 (abnormal) and his pulse is 80. His respiration is 19 and oxygen saturation is 99%.   Wt Readings from Last 3 Encounters:  12/09/18 219 lb (99.3 kg)  11/03/18 217 lb (98.4 kg)  10/27/18 227 lb (103 kg)      Physical Exam Vitals signs reviewed.  HENT:     Head: Normocephalic and atraumatic.  Eyes:     Pupils: Pupils are equal, round, and reactive to  light.  Neck:     Musculoskeletal: Normal range of motion.  Cardiovascular:     Rate and Rhythm: Normal rate and regular rhythm.     Heart sounds: Normal heart sounds.  Pulmonary:     Effort: Pulmonary effort is normal.     Breath sounds: Normal breath sounds.  Abdominal:     General: Bowel sounds are normal.     Palpations: Abdomen is soft.  Musculoskeletal: Normal range of motion.        General: No tenderness or deformity.  Lymphadenopathy:     Cervical: No cervical adenopathy.  Skin:    General: Skin is warm and dry.     Findings: No erythema or rash.  Neurological:     Mental  Status: He is alert and oriented to person, place, and time.  Psychiatric:        Behavior: Behavior normal.        Thought Content: Thought content normal.        Judgment: Judgment normal.      Lab Results  Component Value Date   WBC 5.6 12/09/2018   HGB 9.8 (L) 12/09/2018   HCT 29.9 (L) 12/09/2018   MCV 97.4 12/09/2018   PLT 154 12/09/2018     Chemistry      Component Value Date/Time   NA 136 12/09/2018 1200   NA 141 01/10/2017 1136   NA 137 12/14/2015 1100   K 4.0 12/09/2018 1200   K 4.4 01/10/2017 1136   K 3.7 12/14/2015 1100   CL 101 12/09/2018 1200   CL 104 01/10/2017 1136   CO2 28 12/09/2018 1200   CO2 27 01/10/2017 1136   CO2 24 12/14/2015 1100   BUN 25 (H) 12/09/2018 1200   BUN 19 01/10/2017 1136   BUN 18.4 12/14/2015 1100   CREATININE 1.19 12/09/2018 1200   CREATININE 1.1 01/10/2017 1136   CREATININE 0.9 12/14/2015 1100      Component Value Date/Time   CALCIUM 9.2 12/09/2018 1200   CALCIUM 9.4 01/10/2017 1136   CALCIUM 9.5 12/14/2015 1100   ALKPHOS 79 12/09/2018 1200   ALKPHOS 112 (H) 01/10/2017 1136   ALKPHOS 102 12/14/2015 1100   AST 18 12/09/2018 1200   AST 22 12/14/2015 1100   ALT 8 12/09/2018 1200   ALT 18 01/10/2017 1136   ALT 10 12/14/2015 1100   BILITOT 0.4 12/09/2018 1200   BILITOT 0.58 12/14/2015 1100      Impression and Plan: Mr. Dube is an  85 year old white male. He has relapsed large cell non-Hodgkin lymphoma of the testicle. He was on salvage chemotherapy with Rituxan/bendamustine/Velcade.  He did not really respond well to this.  We then treated him with dose reduced R-ICE.  He responded very well to this.  His last PET scan showed that he was in remission.    Today, we have to give him any Aranesp.  I am sure that this will help him.  He really is symptomatic but I do not think that he can really stand too much of an anemia.  We will see what his iron levels are.   I will see him back in 4 months now.  He will come back in 2 months for a Port-A-Cath flush  We will see him back, we will flush his port a patch.     Volanda Napoleon, MD 12/2/20201:34 PM

## 2018-12-10 LAB — IRON AND TIBC
Iron: 68 ug/dL (ref 42–163)
Saturation Ratios: 31 % (ref 20–55)
TIBC: 223 ug/dL (ref 202–409)
UIBC: 155 ug/dL (ref 117–376)

## 2018-12-10 LAB — LACTATE DEHYDROGENASE: LDH: 178 U/L (ref 98–192)

## 2018-12-10 LAB — FERRITIN: Ferritin: 699 ng/mL — ABNORMAL HIGH (ref 24–336)

## 2018-12-18 NOTE — Progress Notes (Signed)
This encounter was created in error - please disregard.

## 2018-12-22 ENCOUNTER — Other Ambulatory Visit: Payer: Self-pay | Admitting: Family Medicine

## 2018-12-22 ENCOUNTER — Ambulatory Visit (INDEPENDENT_AMBULATORY_CARE_PROVIDER_SITE_OTHER): Payer: Medicare Other | Admitting: General Practice

## 2018-12-22 DIAGNOSIS — Z7901 Long term (current) use of anticoagulants: Secondary | ICD-10-CM

## 2018-12-22 LAB — POCT INR: INR: 3.3 — AB (ref 2.0–3.0)

## 2018-12-22 NOTE — Progress Notes (Signed)
Medical screening examination/treatment/procedure(s) were performed by non-physician practitioner and as supervising physician I was immediately available for consultation/collaboration. I agree with above. Jaymar Loeber, MD   

## 2018-12-22 NOTE — Patient Instructions (Signed)
Pre visit review using our clinic review tool, if applicable. No additional management support is needed unless otherwise documented below in the visit note.  Hold dosage today and then continue to take 5 mg daily except 2.5 mg on Sun and Thurs and re-check in 1 weeks.  Dosing instructions given to Olivia Mackie, RN @ [facility].

## 2018-12-23 ENCOUNTER — Other Ambulatory Visit: Payer: Self-pay | Admitting: Family Medicine

## 2018-12-23 NOTE — Telephone Encounter (Signed)
I do not see this on the current medication list. I wanted to make sure if patient needs to be on this or if he is no longer taking?

## 2018-12-23 NOTE — Telephone Encounter (Signed)
I would not refill if we cannot confirm he is taking.

## 2018-12-29 ENCOUNTER — Ambulatory Visit (INDEPENDENT_AMBULATORY_CARE_PROVIDER_SITE_OTHER): Payer: Medicare Other | Admitting: General Practice

## 2018-12-29 DIAGNOSIS — Z7901 Long term (current) use of anticoagulants: Secondary | ICD-10-CM

## 2018-12-29 DIAGNOSIS — I480 Paroxysmal atrial fibrillation: Secondary | ICD-10-CM

## 2018-12-29 LAB — POCT INR: INR: 3.2 — AB (ref 2.0–3.0)

## 2018-12-29 NOTE — Progress Notes (Signed)
Medical screening examination/treatment/procedure(s) were performed by non-physician practitioner and as supervising physician I was immediately available for consultation/collaboration. I agree with above. Kimyah Frein, MD   

## 2018-12-29 NOTE — Patient Instructions (Signed)
.  lbpcmh  Hold dosage today and then change dosage and take 5 mg daily except 2.5 mg on Sun, Tues and Thurs and re-check in 2 weeks.  Dosing instructions given to Olivia Mackie, RN @ [facility] while in patient's home.

## 2019-01-04 NOTE — Telephone Encounter (Signed)
Message routed to PCP CMA  

## 2019-01-04 NOTE — Telephone Encounter (Signed)
Called patient and NOT able to LMOVM to return call  [place] for [facility] to Discuss results / PCP / recommendations / Schedule patient  Need to confirm patient is taking this medication. I assume by message that he is. A refill has been sent to his pharmacy. Not able to leave a voice message due to patient not having voice mail.  CRM Created.

## 2019-01-04 NOTE — Telephone Encounter (Signed)
Patient is calling to check the status of his medication request.  He stated that he needs the meds. Now.

## 2019-01-12 ENCOUNTER — Ambulatory Visit (INDEPENDENT_AMBULATORY_CARE_PROVIDER_SITE_OTHER): Payer: Medicare Other | Admitting: General Practice

## 2019-01-12 DIAGNOSIS — Z7901 Long term (current) use of anticoagulants: Secondary | ICD-10-CM | POA: Diagnosis not present

## 2019-01-12 LAB — POCT INR: INR: 2.6 (ref 2.0–3.0)

## 2019-01-12 NOTE — Patient Instructions (Addendum)
.  lbpcmh  Continue to take 5 mg daily except 2.5 mg on Sun, Tues and Thurs and re-check in 2 weeks.  Dosing instructions given to Dorian Pod, RN @ [facility] while in patient's home.[phone]

## 2019-01-12 NOTE — Progress Notes (Signed)
Medical screening examination/treatment/procedure(s) were performed by non-physician practitioner and as supervising physician I was immediately available for consultation/collaboration. I agree with above. Lenor Provencher, MD   

## 2019-01-13 ENCOUNTER — Telehealth: Payer: Self-pay | Admitting: Family Medicine

## 2019-01-13 NOTE — Telephone Encounter (Signed)
Spoke with Dorian Pod. Verbal orders given per Dr. Elease Hashimoto

## 2019-01-13 NOTE — Telephone Encounter (Signed)
ok 

## 2019-01-13 NOTE — Telephone Encounter (Signed)
Dorian Pod calling from [facility] stated that she would like verbals for a INR check 1 a week every other week.    She would also like orders for a catheter change 1 a month

## 2019-01-20 ENCOUNTER — Other Ambulatory Visit: Payer: Self-pay | Admitting: Family Medicine

## 2019-01-21 ENCOUNTER — Ambulatory Visit: Payer: Self-pay | Admitting: *Deleted

## 2019-01-21 NOTE — Telephone Encounter (Signed)
Pts daughter called stating that the pt was exposed. The pt is not having any symptoms. Daughter is requesting to know if the pt should be tested as he is supposed to have the covid vaccine on 01/26/19. Please advise.    Returned call to daughter regarding pt being exposed to someone that tested positive recently. She stated that he is not having symptoms and it has been about a week since being exposed.  She has scheduled his first covid vaccine on next Tuesday. She is advised to get him tested today. She voiced understanding.  Routing to LB at [place] for review.  Reason for Disposition . General information question, no triage required and triager able to answer question  Answer Assessment - Initial Assessment Questions 1. REASON FOR CALL or QUESTION: "What is your reason for calling today?" or "How can I best help you?" or "What question do you have that I can help answer?"     When to get to tested for covid.  Protocols used: INFORMATION ONLY CALL - NO TRIAGE-A-AH

## 2019-01-21 NOTE — Telephone Encounter (Signed)
Message Routed to PCP CMA 

## 2019-01-22 ENCOUNTER — Ambulatory Visit (INDEPENDENT_AMBULATORY_CARE_PROVIDER_SITE_OTHER): Payer: Medicare Other | Admitting: General Practice

## 2019-01-22 ENCOUNTER — Ambulatory Visit: Payer: Medicare Other | Attending: Internal Medicine

## 2019-01-22 DIAGNOSIS — Z7901 Long term (current) use of anticoagulants: Secondary | ICD-10-CM | POA: Diagnosis not present

## 2019-01-22 DIAGNOSIS — Z20822 Contact with and (suspected) exposure to covid-19: Secondary | ICD-10-CM

## 2019-01-22 LAB — POCT INR: INR: 2.2 (ref 2.0–3.0)

## 2019-01-22 NOTE — Telephone Encounter (Signed)
Agree with going ahead and getting tested today.  If negative, he should be OK to get vaccine next week.

## 2019-01-22 NOTE — Progress Notes (Signed)
Medical screening examination/treatment/procedure(s) were performed by non-physician practitioner and as supervising physician I was immediately available for consultation/collaboration. I agree with above. Adair Lemar, MD   

## 2019-01-22 NOTE — Patient Instructions (Signed)
.  lbpcmh  Continue to take 5 mg daily except 2.5 mg on Sun, Tues and Thurs and re-check in 2 weeks.  Dosing instructions given to Olivia Mackie, RN @ [facility] while in patient's home.[phone].

## 2019-01-22 NOTE — Telephone Encounter (Signed)
Called patients daughter Lattie Haw and gave her the message from Dr. Elease Hashimoto. Patient is scheduled to be tested today. Lattie Haw will keep [place] updated and verbalized an understanding.

## 2019-01-22 NOTE — Telephone Encounter (Signed)
Please see message.  Please advise. 

## 2019-01-23 ENCOUNTER — Other Ambulatory Visit: Payer: Self-pay | Admitting: Family Medicine

## 2019-01-23 LAB — NOVEL CORONAVIRUS, NAA: SARS-CoV-2, NAA: NOT DETECTED

## 2019-01-31 ENCOUNTER — Other Ambulatory Visit: Payer: Self-pay | Admitting: Family Medicine

## 2019-02-02 ENCOUNTER — Ambulatory Visit (INDEPENDENT_AMBULATORY_CARE_PROVIDER_SITE_OTHER): Payer: Medicare Other | Admitting: General Practice

## 2019-02-02 DIAGNOSIS — Z7901 Long term (current) use of anticoagulants: Secondary | ICD-10-CM

## 2019-02-02 LAB — POCT INR: INR: 2.9 (ref 2.0–3.0)

## 2019-02-02 NOTE — Patient Instructions (Signed)
Pre visit review using our clinic review tool, if applicable. No additional management support is needed unless otherwise documented below in the visit note.  Change dosage and take 5 mg daily except 2.5 mg on Mon and Fridays.  Re-check in 2 weeks.  Dosing instructions given to Kathlee Nations, RN @ [facility] while in patient's home.[phone].  Add 2 to 3 additional servings of green vegetables per week.

## 2019-02-02 NOTE — Progress Notes (Signed)
Medical screening examination/treatment/procedure(s) were performed by non-physician practitioner and as supervising physician I was immediately available for consultation/collaboration. I agree with above. Olamide Carattini, MD   

## 2019-02-05 ENCOUNTER — Telehealth: Payer: Self-pay | Admitting: Family Medicine

## 2019-02-05 NOTE — Telephone Encounter (Signed)
OK to put in order for A1C as requested.

## 2019-02-05 NOTE — Telephone Encounter (Signed)
Please see message. °

## 2019-02-05 NOTE — Telephone Encounter (Signed)
Pt stated his Cancer doctor needs the OK to draw A1C bloodwork from [place]. [facility]  [phone]  Fax an order to have it drawn Fax: [phone] Pt states that the authorization needs to be done before Tuesday February 2nd. Pt can be reached at [phone]

## 2019-02-08 ENCOUNTER — Other Ambulatory Visit: Payer: Self-pay

## 2019-02-08 DIAGNOSIS — Z794 Long term (current) use of insulin: Secondary | ICD-10-CM

## 2019-02-08 DIAGNOSIS — E1140 Type 2 diabetes mellitus with diabetic neuropathy, unspecified: Secondary | ICD-10-CM

## 2019-02-08 NOTE — Telephone Encounter (Signed)
Order has been faxed to the number provided and the confirmation has been received that the fax went through.

## 2019-02-09 ENCOUNTER — Inpatient Hospital Stay: Payer: Medicare Other

## 2019-02-11 ENCOUNTER — Other Ambulatory Visit: Payer: Self-pay

## 2019-02-11 ENCOUNTER — Inpatient Hospital Stay: Payer: Medicare Other | Attending: Hematology & Oncology

## 2019-02-11 DIAGNOSIS — Z452 Encounter for adjustment and management of vascular access device: Secondary | ICD-10-CM | POA: Diagnosis not present

## 2019-02-11 DIAGNOSIS — C8589 Other specified types of non-Hodgkin lymphoma, extranodal and solid organ sites: Secondary | ICD-10-CM | POA: Insufficient documentation

## 2019-02-11 NOTE — Patient Instructions (Signed)

## 2019-02-12 ENCOUNTER — Inpatient Hospital Stay: Payer: Medicare Other

## 2019-02-23 ENCOUNTER — Ambulatory Visit (INDEPENDENT_AMBULATORY_CARE_PROVIDER_SITE_OTHER): Payer: Medicare Other | Admitting: General Practice

## 2019-02-23 DIAGNOSIS — Z7901 Long term (current) use of anticoagulants: Secondary | ICD-10-CM

## 2019-02-23 LAB — POCT INR: INR: 3.3 — AB (ref 2.0–3.0)

## 2019-02-23 NOTE — Progress Notes (Signed)
Medical screening examination/treatment/procedure(s) were performed by non-physician practitioner and as supervising physician I was immediately available for consultation/collaboration. I agree with above. James John, MD   

## 2019-02-23 NOTE — Patient Instructions (Signed)
Pre visit review using our clinic review tool, if applicable. No additional management support is needed unless otherwise documented below in the visit note.  Hold dosage today and then continue to take 5 mg daily except 2.5 mg on Mon and Fridays.  Re-check in 2 weeks.  Dosing instructions given to [facility] @ [facility] while in patient's [phone].  Add 2 to 3 additional servings of green vegetables per week.

## 2019-03-12 ENCOUNTER — Telehealth: Payer: Self-pay

## 2019-03-12 NOTE — Telephone Encounter (Signed)
Gary Yates with [facility] - [phone]  Called with INR result of 3.3. Stated pt is taking it as dosed by Jenny Reichmann on 02.16.21. 2.5 mg on Mon and Friday. 5 mg every other day.

## 2019-03-12 NOTE — Telephone Encounter (Signed)
Will wait for PCP advice

## 2019-03-12 NOTE — Telephone Encounter (Signed)
Called number provided and spoke with daughter [name] who is on DPR and gave her the message from Dr. Elease Hashimoto. [name] stated that home health has been coming out to check INR and I asked her to have them let us and Jenny Reichmann know so we can adjust if need be. [name] verbalized an understanding.

## 2019-03-12 NOTE — Telephone Encounter (Signed)
Sending to [place] as Juluis Rainier

## 2019-03-12 NOTE — Telephone Encounter (Signed)
Change coumadin to 2.5 mg every M, W, and Friday and 5 mg all other days and repeat INR with Jenny Reichmann in 2-3 weeks.

## 2019-03-15 ENCOUNTER — Telehealth (INDEPENDENT_AMBULATORY_CARE_PROVIDER_SITE_OTHER): Payer: Medicare Other | Admitting: Family Medicine

## 2019-03-15 ENCOUNTER — Other Ambulatory Visit: Payer: Self-pay

## 2019-03-15 ENCOUNTER — Encounter: Payer: Self-pay | Admitting: Family Medicine

## 2019-03-15 ENCOUNTER — Ambulatory Visit: Payer: Self-pay | Admitting: General Practice

## 2019-03-15 DIAGNOSIS — I480 Paroxysmal atrial fibrillation: Secondary | ICD-10-CM | POA: Diagnosis not present

## 2019-03-15 DIAGNOSIS — Z794 Long term (current) use of insulin: Secondary | ICD-10-CM

## 2019-03-15 DIAGNOSIS — E1142 Type 2 diabetes mellitus with diabetic polyneuropathy: Secondary | ICD-10-CM | POA: Diagnosis not present

## 2019-03-15 DIAGNOSIS — G2581 Restless legs syndrome: Secondary | ICD-10-CM | POA: Diagnosis not present

## 2019-03-15 DIAGNOSIS — E1140 Type 2 diabetes mellitus with diabetic neuropathy, unspecified: Secondary | ICD-10-CM | POA: Diagnosis not present

## 2019-03-15 DIAGNOSIS — E785 Hyperlipidemia, unspecified: Secondary | ICD-10-CM

## 2019-03-15 LAB — POCT INR: INR: 3.3 — AB (ref 2.0–3.0)

## 2019-03-15 NOTE — Progress Notes (Signed)
This visit type was conducted due to national recommendations for restrictions regarding the COVID-19 pandemic in an effort to limit this patient's exposure and mitigate transmission in our community.   Virtual Visit via Video Note  I connected with Gary Yates on 03/15/19 at  4:30 PM EST by a video enabled telemedicine application and verified that I am speaking with the correct person using two identifiers.  Location patient: home Location provider:work or home office Persons participating in the virtual visit: patient, provider  I discussed the limitations of evaluation and management by telemedicine and the availability of in person appointments. The patient expressed understanding and agreed to proceed.   HPI:  Gary Yates has chronic problems including type 2 diabetes, diabetic peripheral neuropathy, hypertension, hyperlipidemia, chronic kidney disease, restless leg syndrome, obstructive sleep apnea, obesity.  His major complaint is ongoing restless symptoms especially at night.  He states that about a hour after he goes to bed he has "jerking "of right and left legs.  This is relieved somewhat when he gets up to move around.  He has been for several years on Mirapex currently taking 3 mg at night and he takes 600 mg of gabapentin at night as well.  He does frequently drink Coke at night with supper and we advised against caffeine in the past.  He has had ferritin levels through hematology which have been normal.  He has type 2 diabetes which has been fairly well controlled.  He does have longstanding history of peripheral neuropathy and some leg pain even on gabapentin.  Last A1c back in July was 6.3%.  Recent blood sugars fasting usually ranging around 118-124.  ROS: See pertinent positives and negatives per HPI.  Past Medical History:  Diagnosis Date  . Anemia in chronic renal disease 05/07/2017  . Anxiety   . Atrial fibrillation ([place])   . COPD (chronic obstructive pulmonary disease)  ([place])    pt. denies  . Coronary artery disease    a. h/o Overlapping stents RCA;  b. 06/2011 Cath: patent stents, nonobs dzs, NL EF.  . Diabetic peripheral neuropathy ([place])   . Diffuse non-Hodgkin's lymphoma of testis ([place]) 09/28/2015  . DM (diabetes mellitus) ([place])    Type 2, peripheral neuropathy.  . Dyspnea    with exertion  . Dysrhythmia   . GERD (gastroesophageal reflux disease)   . Headache   . History of bronchitis   . History of kidney stones   . History of radiation therapy 02/19/16 - 03/13/16   Testis/Scrotum: 32.4 Gy in 18 fractions  . History of radiation therapy 08/07/16-08/20/16   left adrenal gland mass treated to 30 Gy in 10 fractions  . Hyperlipidemia   . Hypertension   . Iron deficiency anemia due to chronic blood loss 08/08/2017  . Low testosterone   . Nephrolithiasis   . OSA (obstructive sleep apnea) 11/26/2017  . Osteoarthritis    shoulder  . Restless leg   . SVT (supraventricular tachycardia) ([place])   . Urinary frequency   . Wears partial dentures    upper and lower    Past Surgical History:  Procedure Laterality Date  . APPENDECTOMY    . [place]  . CARDIAC CATHETERIZATION  01/2013  . CATARACT EXTRACTION, BILATERAL    . CHOLECYSTECTOMY    . COLONOSCOPY    . CORONARY ANGIOPLASTY  2004  . CYSTOSCOPY N/A 08/18/2017   Procedure: CYSTOSCOPY WITH FULGURATION AND SUPRA PUBIC TUBE PLACEMENT;  Surgeon: Kathie Rhodes, MD;  [facility];  Service: Urology;  Laterality: N/A;  . EYE SURGERY Bilateral    cataracts  . IR GENERIC HISTORICAL  10/05/2015   IR US GUIDE VASC ACCESS RIGHT 10/05/2015 Marybelle Killings, MD [facility]  . IR GENERIC HISTORICAL  10/05/2015   IR FLUORO GUIDE PORT INSERTION RIGHT 10/05/2015 Marybelle Killings, MD [facility]  . LEFT HEART CATHETERIZATION WITH CORONARY ANGIOGRAM N/A 06/18/2011   Procedure: LEFT HEART CATHETERIZATION WITH CORONARY ANGIOGRAM;  Surgeon: Peter M Martinique, MD;  Location: [facility] CATH LAB;  Service: Cardiovascular;   Laterality: N/A;  . LEFT HEART CATHETERIZATION WITH CORONARY ANGIOGRAM N/A 01/27/2013   Procedure: LEFT HEART CATHETERIZATION WITH CORONARY ANGIOGRAM;  Surgeon: Burnell Blanks, MD;  Location: [facility] CATH LAB;  Service: Cardiovascular;  Laterality: N/A;  . LUMBAR LAMINECTOMY/DECOMPRESSION MICRODISCECTOMY N/A 02/07/2015   Procedure: Lumbar three-Sacral one Decompression;  Surgeon: Kevan Ny Ditty, MD;  Location: [place] NEURO ORS;  Service: Neurosurgery;  Laterality: N/A;  L3 to S1 Decompression  . MULTIPLE TOOTH EXTRACTIONS    . ORCHIECTOMY Right 09/01/2015   Procedure: RIGHT ORCHIECTOMY;  Surgeon: Kathie Rhodes, MD;  [facility]: [facility];  Service: Urology;  Laterality: Right;  . port a cath in place     . ROTATOR CUFF REPAIR Left     Family History  Problem Relation Age of Onset  . Alzheimer's disease Mother   . Heart disease Mother   . Heart disease Father   . Migraines Father   . Ulcers Father   . Prostate cancer Brother   . Coronary artery disease Other        Male 1st degree relative <50  . Coronary artery disease Other        male 1st degree relative <60  . Heart disease Sister   . Obesity Sister        Morbid  . Arthritis Sister   . Heart disease Brother   . Arthritis Brother   . Sleep apnea Son   . Obesity Son   . Migraines Daughter   . Thyroid disease Daughter     SOCIAL HX: Quit smoking around 1980   Current Outpatient Medications:  .  acetaminophen (TYLENOL) 500 MG tablet, Take 1,000 mg by mouth every 6 (six) hours as needed (for pain/fever/headaches.). , Disp: , Rfl:  .  atorvastatin (LIPITOR) 40 MG tablet, Take 1 tablet (40 mg total) by mouth daily. Please schedule follow up appointment with labs for refills. [phone], Disp: 30 tablet, Rfl: 0 .  docusate sodium (COLACE) 50 MG capsule, Take 1 capsule (50 mg total) by mouth 2 (two) times daily as needed (for constipation). (Patient not taking: Reported on 12/09/2018), Disp: 90 capsule, Rfl: 0 .  furosemide  (LASIX) 40 MG tablet, Take 1 tablet (40 mg total) by mouth every other day. Take one tablet by mouth every other day., Disp: 45 tablet, Rfl: 1 .  gabapentin (NEURONTIN) 300 MG capsule, TAKE 2 CAPSULES BY MOUTH 3 TIMES A DAY, Disp: 540 capsule, Rfl: 0 .  glucose blood (ACCU-CHEK AVIVA PLUS) test strip, 1 each by Other route 3 (three) times daily. for testing, Disp: 300 each, Rfl: 1 .  KLOR-CON M20 20 MEQ tablet, TAKE 1 TABLET BY MOUTH EVERY DAY, Disp: 30 tablet, Rfl: 2 .  Melatonin 1 MG TABS, Take 1 tablet (1 mg total) by mouth at bedtime. (Patient taking differently: Take 5 tablets by mouth at bedtime. ), Disp: 30 tablet, Rfl: 2 .  metFORMIN (GLUCOPHAGE) 500 MG tablet, TAKE 1 TABLET  BY MOUTH TWICE A DAY WITH MEALS, Disp: 180 tablet, Rfl: 0 .  metoprolol succinate (TOPROL-XL) 25 MG 24 hr tablet, Take 1 tablet (25 mg total) by mouth daily., Disp: 90 tablet, Rfl: 3 .  nitroGLYCERIN (NITROSTAT) 0.4 MG SL tablet, Place 1 tablet (0.4 mg total) under the tongue every 5 (five) minutes as needed. Chest pain (Patient not taking: Reported on 12/09/2018), Disp: 25 tablet, Rfl: 6 .  nystatin (MYCOSTATIN/NYSTOP) powder, APPLY TO AFFECTED AREA 4 TIMES A DAY, Disp: 60 g, Rfl: 0 .  nystatin-triamcinolone ointment (MYCOLOG), Apply 1 application topically 2 (two) times daily., Disp: 15 g, Rfl: 0 .  pantoprazole (PROTONIX) 40 MG tablet, TAKE 1 TABLET BY MOUTH EVERY DAY, Disp: 90 tablet, Rfl: 3 .  polyethylene glycol (MIRALAX / GLYCOLAX) packet, Take 17 g by mouth daily., Disp: 14 each, Rfl: 0 .  pramipexole (MIRAPEX) 1.5 MG tablet, TAKE 1 TABLET BY MOUTH TWICE A DAY, Disp: 60 tablet, Rfl: 2 .  warfarin (COUMADIN) 5 MG tablet, Take 1 tablet daily except take 1 1/2 tablets on Sundays only or Take as directed by anticoagulation clinic., Disp: 100 tablet, Rfl: 1 No current facility-administered medications for this visit.  Facility-Administered Medications Ordered in Other Visits:  .  acetaminophen (TYLENOL) tablet 650 mg,  650 mg, Oral, Once, [place], Sarah M, NP .  pegfilgrastim (NEULASTA ONPRO KIT) injection 6 mg, 6 mg, Subcutaneous, Once, [place], Peter R, MD .  sodium chloride flush (NS) 0.9 % injection 10 mL, 10 mL, Intracatheter, PRN, Volanda Napoleon, MD, 10 mL at 02/27/17 1503 .  sodium chloride flush (NS) 0.9 % injection 10 mL, 10 mL, Intravenous, PRN, Volanda Napoleon, MD, 10 mL at 05/07/17 9735  EXAM:  VITALS per patient if applicable:  GENERAL: alert, oriented, appears well and in no acute distress  HEENT: atraumatic, conjunttiva clear, no obvious abnormalities on inspection of external nose and ears  NECK: normal movements of the head and neck  LUNGS: on inspection no signs of respiratory distress, breathing rate appears normal, no obvious gross SOB, gasping or wheezing  CV: no obvious cyanosis  MS: moves all visible extremities without noticeable abnormality  PSYCH/NEURO: pleasant and cooperative, no obvious depression or anxiety, speech and thought processing grossly intact  ASSESSMENT AND PLAN:  Discussed the following assessment and plan:  #1 restless leg symptoms.  He is having persistent nighttime symptoms in spite of gabapentin and Mirapex.  He does have risk factor of some ongoing caffeine use at night.  No history of known iron deficiency.  -We recommended neurology opinion at this point as he is still struggling with control.  He does have some painful neuropathy symptoms as well but it sounds like his restless leg symptoms are keeping him awake mostly at night  #2 type 2 diabetes.  History of good control.  -Recommend in office follow-up over the next month or so to reassess A1c  #3 dyslipidemia treated with statin with atorvastatin.  Overdue for follow-up labs  -Fasting lipid at follow-up above  #4 chronic atrial fibrillation on Coumadin.  Recent INR 3.3.  Adjustment was made in Coumadin dosing and he will get follow-up INR in 2 weeks     I discussed the assessment  and treatment plan with the patient. The patient was provided an opportunity to ask questions and all were answered. The patient agreed with the plan and demonstrated an understanding of the instructions.   The patient was advised to call back or seek an in-person evaluation  if the symptoms worsen or if the condition fails to improve as anticipated.     Carolann Littler, MD

## 2019-03-15 NOTE — Patient Instructions (Addendum)
Pre visit review using our clinic review tool, if applicable. No additional management support is needed unless otherwise documented below in the visit note.  Per Dr. Elease Hashimoto, change dosage and take 5 mg daily except 2.5 mg on Mon, Wed and Fridays.  Re-check in 2 weeks.  Dosing instructions and VO given to [place],  [place] @ [facility] while in patient's home.  Add 2 to 3 additional servings of green vegetables per week. [phone].

## 2019-03-16 ENCOUNTER — Telehealth: Payer: Self-pay | Admitting: Family Medicine

## 2019-03-16 NOTE — Telephone Encounter (Signed)
OK to increase the Lasix for 3-4 days and then drop back to usual dose.

## 2019-03-16 NOTE — Telephone Encounter (Signed)
Please advise 

## 2019-03-16 NOTE — Telephone Encounter (Signed)
I called the patient to schedule an appointment for tomorrow to follow up fromt he virtual visit on Monday 03/15/2019 and he wanted to let [name] know that he is diabetic and his legs and feet are swollen. He takes fluid pills and normally takes 40 mg and he increased the fluid pills to 60 mg today to see if that will help take the swelling down. He is also elevating his feet. He would like to talk to someone to make sure that it's okay that he increased his fluid pills.  Please advise

## 2019-03-16 NOTE — Telephone Encounter (Signed)
Called patient and gave the message. Patient verbalized an understanding.

## 2019-03-17 ENCOUNTER — Ambulatory Visit: Payer: Medicare Other | Admitting: Family Medicine

## 2019-03-17 ENCOUNTER — Other Ambulatory Visit: Payer: Self-pay

## 2019-03-17 ENCOUNTER — Encounter: Payer: Self-pay | Admitting: Family Medicine

## 2019-03-17 VITALS — BP 104/58 | HR 71 | Temp 97.8°F | Wt 234.2 lb

## 2019-03-17 DIAGNOSIS — G2581 Restless legs syndrome: Secondary | ICD-10-CM | POA: Diagnosis not present

## 2019-03-17 DIAGNOSIS — E1140 Type 2 diabetes mellitus with diabetic neuropathy, unspecified: Secondary | ICD-10-CM | POA: Diagnosis not present

## 2019-03-17 DIAGNOSIS — M79641 Pain in right hand: Secondary | ICD-10-CM

## 2019-03-17 DIAGNOSIS — Z794 Long term (current) use of insulin: Secondary | ICD-10-CM

## 2019-03-17 DIAGNOSIS — E782 Mixed hyperlipidemia: Secondary | ICD-10-CM

## 2019-03-17 LAB — POCT GLYCOSYLATED HEMOGLOBIN (HGB A1C): Hemoglobin A1C: 6.6 % — AB (ref 4.0–5.6)

## 2019-03-17 NOTE — Progress Notes (Signed)
Subjective:     Patient ID: Gary Yates., male   DOB: [birth year], 86 y.o.   MRN: [id number]  HPI   Gary Yates has multiple chronic problems including history of obesity, CAD, hypertension, atrial fibrillation, COPD, obstructive sleep apnea, GERD, type 2 diabetes, chronic peripheral neuropathy, non-Hodgkin's lymphoma, chronic kidney disease stage III, dyslipidemia, restless leg syndrome  He had recently called stating that he was having significant restless leg symptoms at night.  He gets up and moves around and usually after about 15 minutes he settles back down.  He is on high-dose Mirapex dose of 3 mg at night and also takes gabapentin 600 mg at night but still having breakthrough symptoms.  He does not have iron deficiency by recent labs.  He is trying to scale back caffeine.  Type 2 diabetes.  History of good control.  Taking Metformin.  He lost some weight with his cancer diagnosis and since that time has had better control of his diabetes.  He has hyperlipidemia treated with atorvastatin.  Overdue for labs. He has had some bilateral leg edema which is somewhat chronic.  Is on high-dose gabapentin which could be contributing.  Takes Lasix 40 mg most days.  Tried some compression stockings but had discomfort and felt these were too tight.  Also relates right hand pain.  He states he fell couple weeks ago has some pain mostly involving distal aspect of the fifth metacarpal and proximal fifth digit.  He has had some initial bruising.  Still has pain with making a handgrip.  Past Medical History:  Diagnosis Date  . Anemia in chronic renal disease 05/07/2017  . Anxiety   . Atrial fibrillation ([place])   . COPD (chronic obstructive pulmonary disease) ([place])    pt. denies  . Coronary artery disease    a. h/o Overlapping stents RCA;  b. 06/2011 Cath: patent stents, nonobs dzs, NL EF.  . Diabetic peripheral neuropathy ([place])   . Diffuse non-Hodgkin's lymphoma of testis ([place]) 09/28/2015  . DM  (diabetes mellitus) ([place])    Type 2, peripheral neuropathy.  . Dyspnea    with exertion  . Dysrhythmia   . GERD (gastroesophageal reflux disease)   . Headache   . History of bronchitis   . History of kidney stones   . History of radiation therapy 02/19/16 - 03/13/16   Testis/Scrotum: 32.4 Gy in 18 fractions  . History of radiation therapy 08/07/16-08/20/16   left adrenal gland mass treated to 30 Gy in 10 fractions  . Hyperlipidemia   . Hypertension   . Iron deficiency anemia due to chronic blood loss 08/08/2017  . Low testosterone   . Nephrolithiasis   . OSA (obstructive sleep apnea) 11/26/2017  . Osteoarthritis    shoulder  . Restless leg   . SVT (supraventricular tachycardia) ([place])   . Urinary frequency   . Wears partial dentures    upper and lower   Past Surgical History:  Procedure Laterality Date  . APPENDECTOMY    . [place]  . CARDIAC CATHETERIZATION  01/2013  . CATARACT EXTRACTION, BILATERAL    . CHOLECYSTECTOMY    . COLONOSCOPY    . CORONARY ANGIOPLASTY  2004  . CYSTOSCOPY N/A 08/18/2017   Procedure: CYSTOSCOPY WITH FULGURATION AND SUPRA PUBIC TUBE PLACEMENT;  Surgeon: Kathie Rhodes, MD;  [facility];  Service: Urology;  Laterality: N/A;  . EYE SURGERY Bilateral    cataracts  . IR GENERIC HISTORICAL  10/05/2015  IR US GUIDE VASC ACCESS RIGHT 10/05/2015 Marybelle Killings, MD [facility]  . IR GENERIC HISTORICAL  10/05/2015   IR FLUORO GUIDE PORT INSERTION RIGHT 10/05/2015 Marybelle Killings, MD [facility]  . LEFT HEART CATHETERIZATION WITH CORONARY ANGIOGRAM N/A 06/18/2011   Procedure: LEFT HEART CATHETERIZATION WITH CORONARY ANGIOGRAM;  Surgeon: Peter M Martinique, MD;  Location: [facility] CATH LAB;  Service: Cardiovascular;  Laterality: N/A;  . LEFT HEART CATHETERIZATION WITH CORONARY ANGIOGRAM N/A 01/27/2013   Procedure: LEFT HEART CATHETERIZATION WITH CORONARY ANGIOGRAM;  Surgeon: Burnell Blanks, MD;  Location: [facility] CATH LAB;  Service: Cardiovascular;  Laterality:  N/A;  . LUMBAR LAMINECTOMY/DECOMPRESSION MICRODISCECTOMY N/A 02/07/2015   Procedure: Lumbar three-Sacral one Decompression;  Surgeon: Kevan Ny Ditty, MD;  Location: [place] NEURO ORS;  Service: Neurosurgery;  Laterality: N/A;  L3 to S1 Decompression  . MULTIPLE TOOTH EXTRACTIONS    . ORCHIECTOMY Right 09/01/2015   Procedure: RIGHT ORCHIECTOMY;  Surgeon: Kathie Rhodes, MD;  [facility];  Service: Urology;  Laterality: Right;  . port a cath in place     . ROTATOR CUFF REPAIR Left     reports that he quit smoking about 40 years ago. His smoking use included cigarettes. He has a 30.00 pack-year smoking history. He has never used smokeless tobacco. He reports that he does not drink alcohol or use drugs. family history includes Alzheimer's disease in his mother; Arthritis in his brother and sister; Coronary artery disease in some other family members; Heart disease in his brother, father, mother, and sister; Migraines in his daughter and father; Obesity in his sister and son; Prostate cancer in his brother; Sleep apnea in his son; Thyroid disease in his daughter; Ulcers in his father. Allergies  Allergen Reactions  . Ace Inhibitors Other (See Comments)    cough  . Codeine Nausea Only and Rash       . Penicillins Rash    Childhood allergy Has patient had a PCN reaction causing immediate rash, facial/tongue/throat swelling, SOB or lightheadedness with hypotension: Yes Has patient had a PCN reaction causing severe rash involving mucus membranes or skin necrosis: Yes Has patient had a PCN reaction that required hospitalization No Has patient had a PCN reaction occurring within the last 10 years: No If all of the above answers are "NO", then may proceed with Cephalosporin use.      Review of Systems  Constitutional: Positive for fatigue. Negative for chills and fever.  Respiratory: Negative for cough.   Cardiovascular: Positive for leg swelling. Negative for chest pain.  Gastrointestinal:  Negative for abdominal pain, nausea and vomiting.  Genitourinary: Negative for dysuria.  Neurological: Negative for speech difficulty and headaches.  Psychiatric/Behavioral: Positive for sleep disturbance. Negative for confusion.       Objective:   Physical Exam Vitals reviewed.  Constitutional:      Appearance: Normal appearance.  HENT:     Right Ear: Tympanic membrane normal.     Left Ear: Tympanic membrane normal.  Cardiovascular:     Rate and Rhythm: Normal rate and regular rhythm.  Pulmonary:     Effort: Pulmonary effort is normal.     Breath sounds: Normal breath sounds.  Abdominal:     Palpations: Abdomen is soft.     Tenderness: There is no abdominal tenderness.  Musculoskeletal:     Comments: Has trace pitting leg edema bilaterally.  Right hand   No ecchymosis.  No edema.  Mild tender distal 5th metacarpal and fifth digit proximally.    Lymphadenopathy:  Cervical: No cervical adenopathy.  Neurological:     Mental Status: He is alert and oriented to person, place, and time.        Assessment:     #1 restless leg syndrome.  Poorly controlled with current therapy of Mirapex and gabapentin.  Neurology referral has been placed and pending  #2 type 2 diabetes well controlled with A1c 6.6  #3 hypertension history.  Blood pressure seated 102/58 and standing 92/58.  Patient asymptomatic  #4 dyslipidemia.  Treated with atorvastatin.  Overdue for labs  #5 right hand and fifth digit pain following fall    Plan:     -Order placed for right hand x-ray and this will be scheduled -Return for fasting lipid, basic metabolic panel, hepatic panel -Pending neurology referral for his restless leg syndrome poorly controlled with gabapentin and Mirapex -stay well hydrated.  Follow up for any dizziness/orthostatic symptoms.   [name] MD [facility]

## 2019-03-17 NOTE — Patient Instructions (Signed)

## 2019-03-19 ENCOUNTER — Other Ambulatory Visit: Payer: Self-pay

## 2019-03-20 ENCOUNTER — Other Ambulatory Visit: Payer: Self-pay | Admitting: Family Medicine

## 2019-03-22 ENCOUNTER — Telehealth: Payer: Self-pay

## 2019-03-22 ENCOUNTER — Other Ambulatory Visit: Payer: Self-pay

## 2019-03-22 ENCOUNTER — Other Ambulatory Visit (INDEPENDENT_AMBULATORY_CARE_PROVIDER_SITE_OTHER): Payer: Medicare Other

## 2019-03-22 ENCOUNTER — Ambulatory Visit (INDEPENDENT_AMBULATORY_CARE_PROVIDER_SITE_OTHER): Payer: Medicare Other

## 2019-03-22 DIAGNOSIS — M79641 Pain in right hand: Secondary | ICD-10-CM

## 2019-03-22 DIAGNOSIS — E782 Mixed hyperlipidemia: Secondary | ICD-10-CM | POA: Diagnosis not present

## 2019-03-22 DIAGNOSIS — E1140 Type 2 diabetes mellitus with diabetic neuropathy, unspecified: Secondary | ICD-10-CM

## 2019-03-22 DIAGNOSIS — Z794 Long term (current) use of insulin: Secondary | ICD-10-CM

## 2019-03-22 LAB — HEPATIC FUNCTION PANEL
ALT: 4 U/L (ref 0–53)
AST: 13 U/L (ref 0–37)
Albumin: 3.7 g/dL (ref 3.5–5.2)
Alkaline Phosphatase: 91 U/L (ref 39–117)
Bilirubin, Direct: 0.1 mg/dL (ref 0.0–0.3)
Total Bilirubin: 0.5 mg/dL (ref 0.2–1.2)
Total Protein: 6.9 g/dL (ref 6.0–8.3)

## 2019-03-22 LAB — LIPID PANEL
Cholesterol: 166 mg/dL (ref 0–200)
HDL: 29.8 mg/dL — ABNORMAL LOW (ref >39.00)
NonHDL: 135.71
Total CHOL/HDL Ratio: 6
Triglycerides: 373 mg/dL — ABNORMAL HIGH (ref 0.0–149.0)
VLDL: 74.6 mg/dL — ABNORMAL HIGH (ref 0.0–40.0)

## 2019-03-22 LAB — BASIC METABOLIC PANEL
BUN: 36 mg/dL — ABNORMAL HIGH (ref 6–23)
CO2: 32 mEq/L (ref 19–32)
Calcium: 9.3 mg/dL (ref 8.4–10.5)
Chloride: 96 mEq/L (ref 96–112)
Creatinine, Ser: 1.72 mg/dL — ABNORMAL HIGH (ref 0.40–1.50)
GFR: 38.06 mL/min — ABNORMAL LOW (ref >60.00)
Glucose, Bld: 157 mg/dL — ABNORMAL HIGH (ref 70–99)
Potassium: 4 mEq/L (ref 3.5–5.1)
Sodium: 137 mEq/L (ref 135–145)

## 2019-03-22 LAB — LDL CHOLESTEROL, DIRECT: Direct LDL: 70 mg/dL

## 2019-03-22 LAB — HEMOGLOBIN A1C: Hgb A1c MFr Bld: 7 % — ABNORMAL HIGH (ref 4.6–6.5)

## 2019-03-22 NOTE — Telephone Encounter (Signed)
jamie from [facility] radiology called in to give call report on rigth hand xray : non -displaced fractures in distal 4th and 5th metacarpals, ICO arthritic change primarily in first set of metacarpals.  Paper has been given to Dr.[name]

## 2019-03-23 ENCOUNTER — Other Ambulatory Visit: Payer: Self-pay

## 2019-03-23 DIAGNOSIS — R7989 Other specified abnormal findings of blood chemistry: Secondary | ICD-10-CM

## 2019-03-24 ENCOUNTER — Ambulatory Visit (INDEPENDENT_AMBULATORY_CARE_PROVIDER_SITE_OTHER): Payer: Medicare Other | Admitting: General Practice

## 2019-03-24 ENCOUNTER — Telehealth: Payer: Self-pay | Admitting: Family Medicine

## 2019-03-24 DIAGNOSIS — Z7901 Long term (current) use of anticoagulants: Secondary | ICD-10-CM

## 2019-03-24 LAB — POCT INR: INR: 2.5 (ref 2.0–3.0)

## 2019-03-24 NOTE — Telephone Encounter (Signed)
Pt's son stated that the doctor [name] referred him to is asking if it is okay to prescribed him Celebrex?  It is for his fractured hand  He can be reached at [phone]

## 2019-03-24 NOTE — Patient Instructions (Signed)
Pre visit review using our clinic review tool, if applicable. No additional management support is needed unless otherwise documented below in the visit note.  Continue to take 5 mg daily except 2.5 mg on Mon, Wed and Fridays.  Re-check in 2 weeks.  Dosing instructions and VO given to Kathlee [place] @[facility]  while in patient's home.  Add 2 to 3 additional servings of green vegetables per week. [phone].

## 2019-03-24 NOTE — Progress Notes (Signed)
I have reviewed and agree with note, evaluation, plan.   Westly Hinnant, MD  

## 2019-03-25 ENCOUNTER — Other Ambulatory Visit: Payer: Self-pay | Admitting: Family Medicine

## 2019-03-25 IMAGING — PT NM PET TUM IMG RESTAG (PS) SKULL BASE T - THIGH
7 of 8 series · 23 of 25 positions shown · non-contrast
Comparison: 04/28/2017

CLINICAL DATA: Subsequent treatment strategy for large B-cell
lymphoma..

EXAM:
NUCLEAR MEDICINE PET SKULL BASE TO THIGH
TECHNIQUE: 10.75 mCi F-18 FDG was injected intravenously. Full-ring PET imaging
was performed from the skull base to thigh after the radiotracer. CT
data was obtained and used for attenuation correction and anatomic
localization.
Fasting blood glucose: 136 mg/dl

[Series 3: pet sk_thigh ac · axial · 5.0mm · 4.07mm/px · z∈[-1490,-582]mm · 6 of 228 slices shown]
[im 1/228]
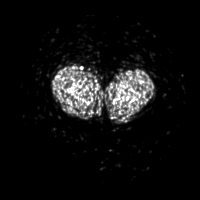
[im 46/228]
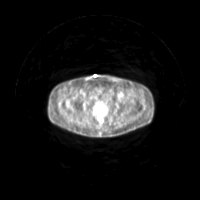
[im 91/228]
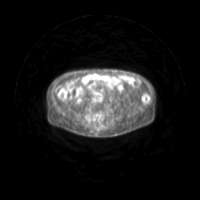
[im 137/228]
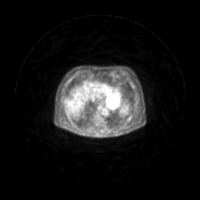
[im 182/228]
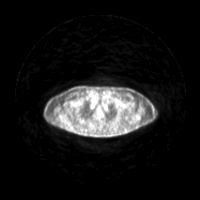
[im 228/228]
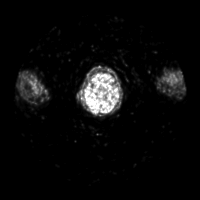

[Series 4: ct sk_thigh 5.0 b31f · axial · 5.0mm · 0.98mm/px · z∈[-1266,-582]mm · 4 of 228 slices shown]
[im 57/228]
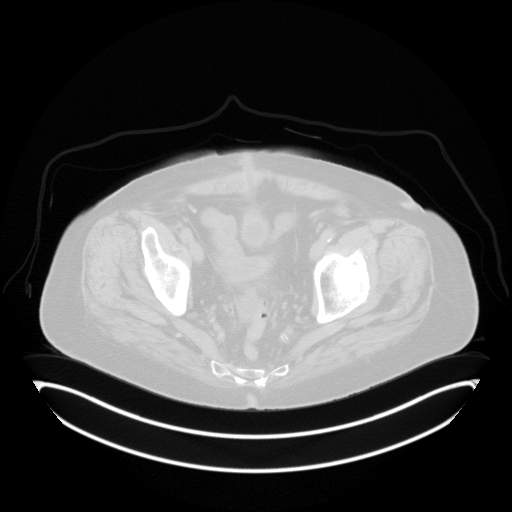
[im 114/228]
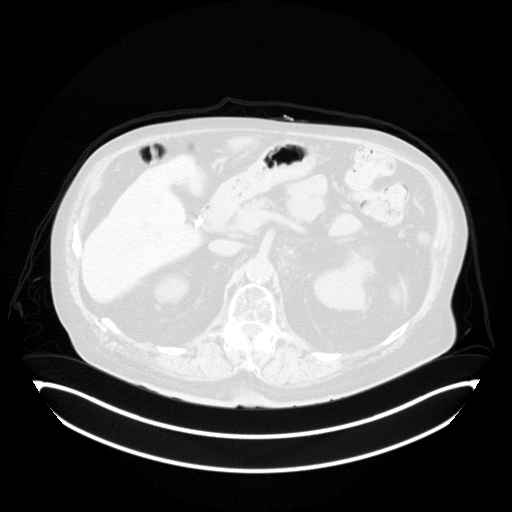
[im 171/228]
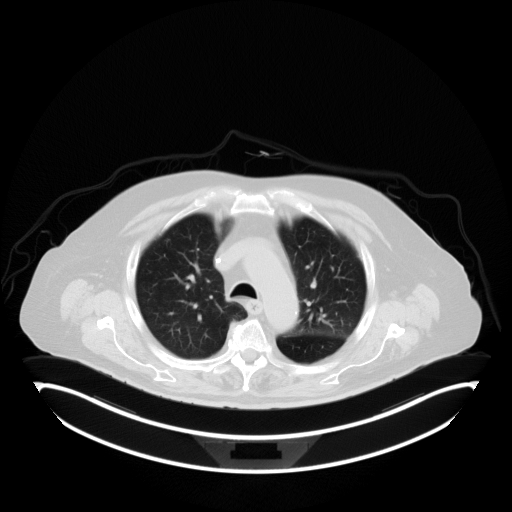
[im 228/228  brain]
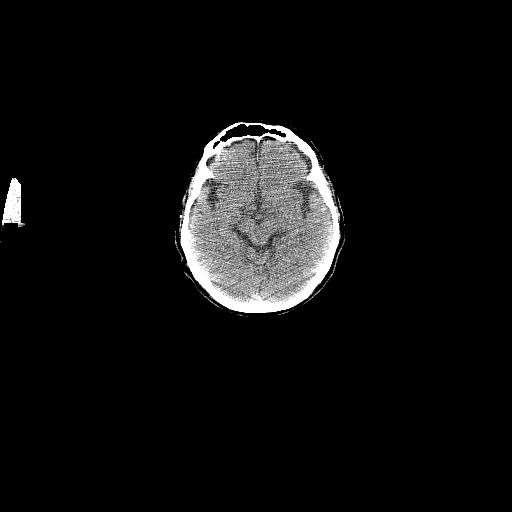

[Series 5: pet sk_thigh nac · axial · 5.0mm · 4.07mm/px · z∈[-1490,-582]mm · 5 of 228 slices shown]
[im 1/228]
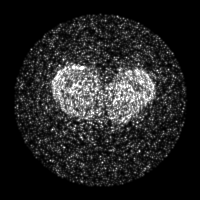
[im 57/228]
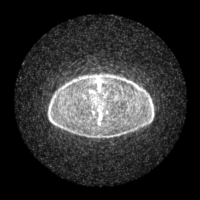
[im 114/228]
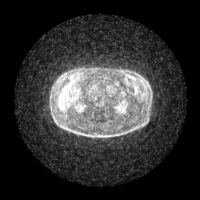
[im 171/228]
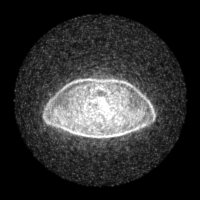
[im 228/228]
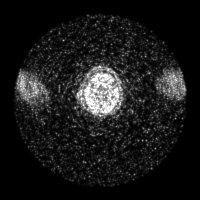

[Series 8: ct sk_thigh 5.0 b70f lung_bone · axial · 5.0mm · 0.66mm/px · 1 of 63 slices shown]
[im 1/63  bone]
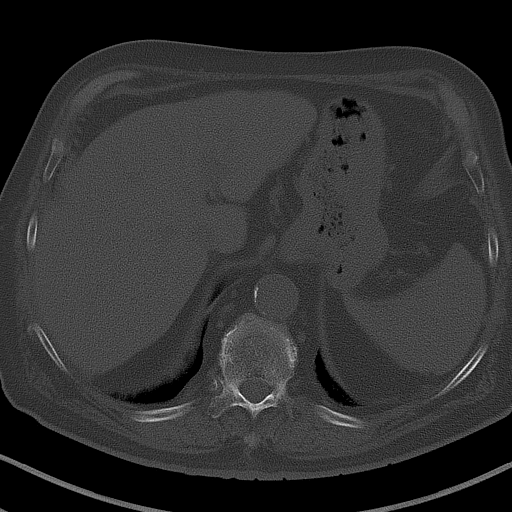

[Series 603: range-ct sk_thigh 5.0 (id)<alpha range> · 1 of 63 slices shown (1 of 2)]
[im 1/63]
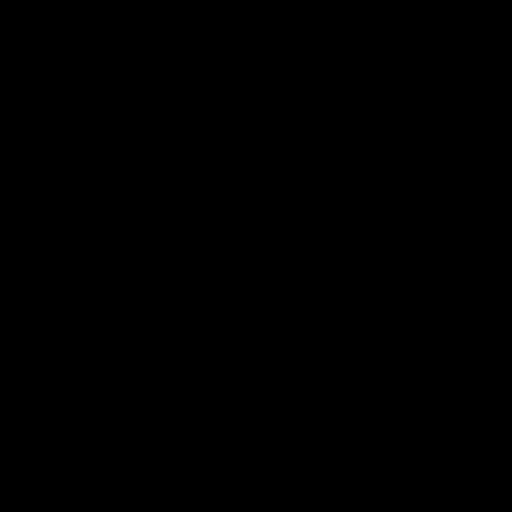

[Series 605: range-ct sk_thigh 5.0 (id)<alpha range> · 5 of 216 slices shown (2 of 2)]
[im 1/216]
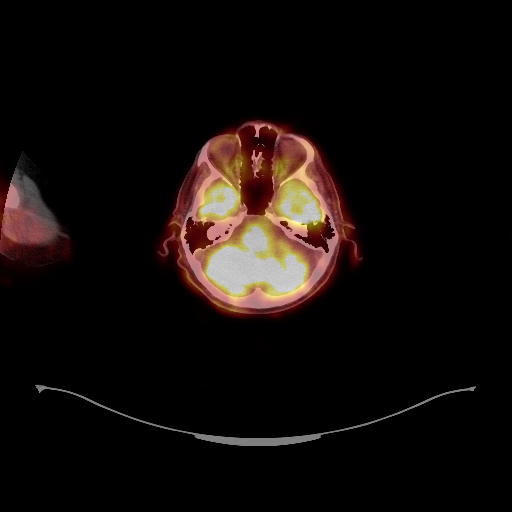
[im 54/216]
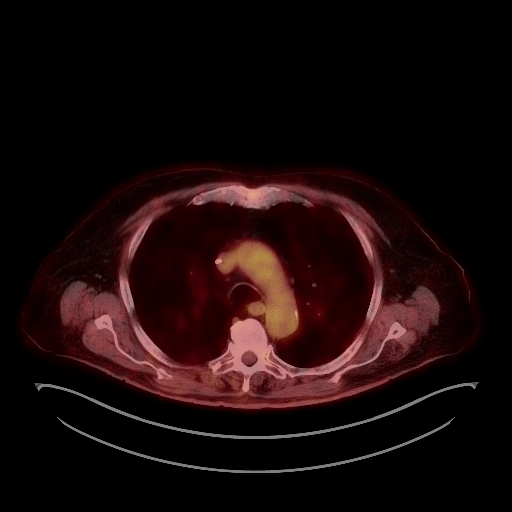
[im 108/216]
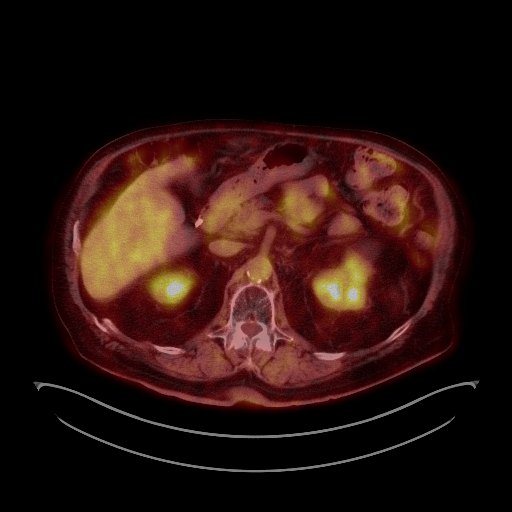
[im 162/216]
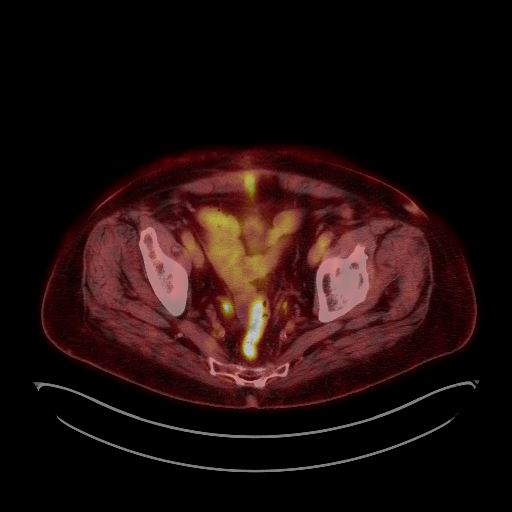
[im 216/216]
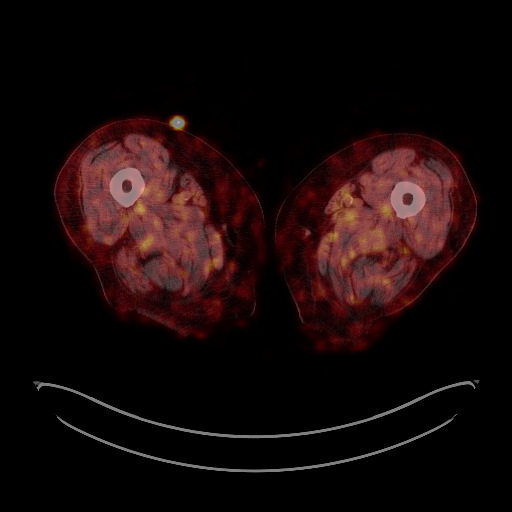

[Series 2026: results mm oncology reading · 5.0mm · 0.35mm/px · 1 of 1 slices shown]
[im 1/1]
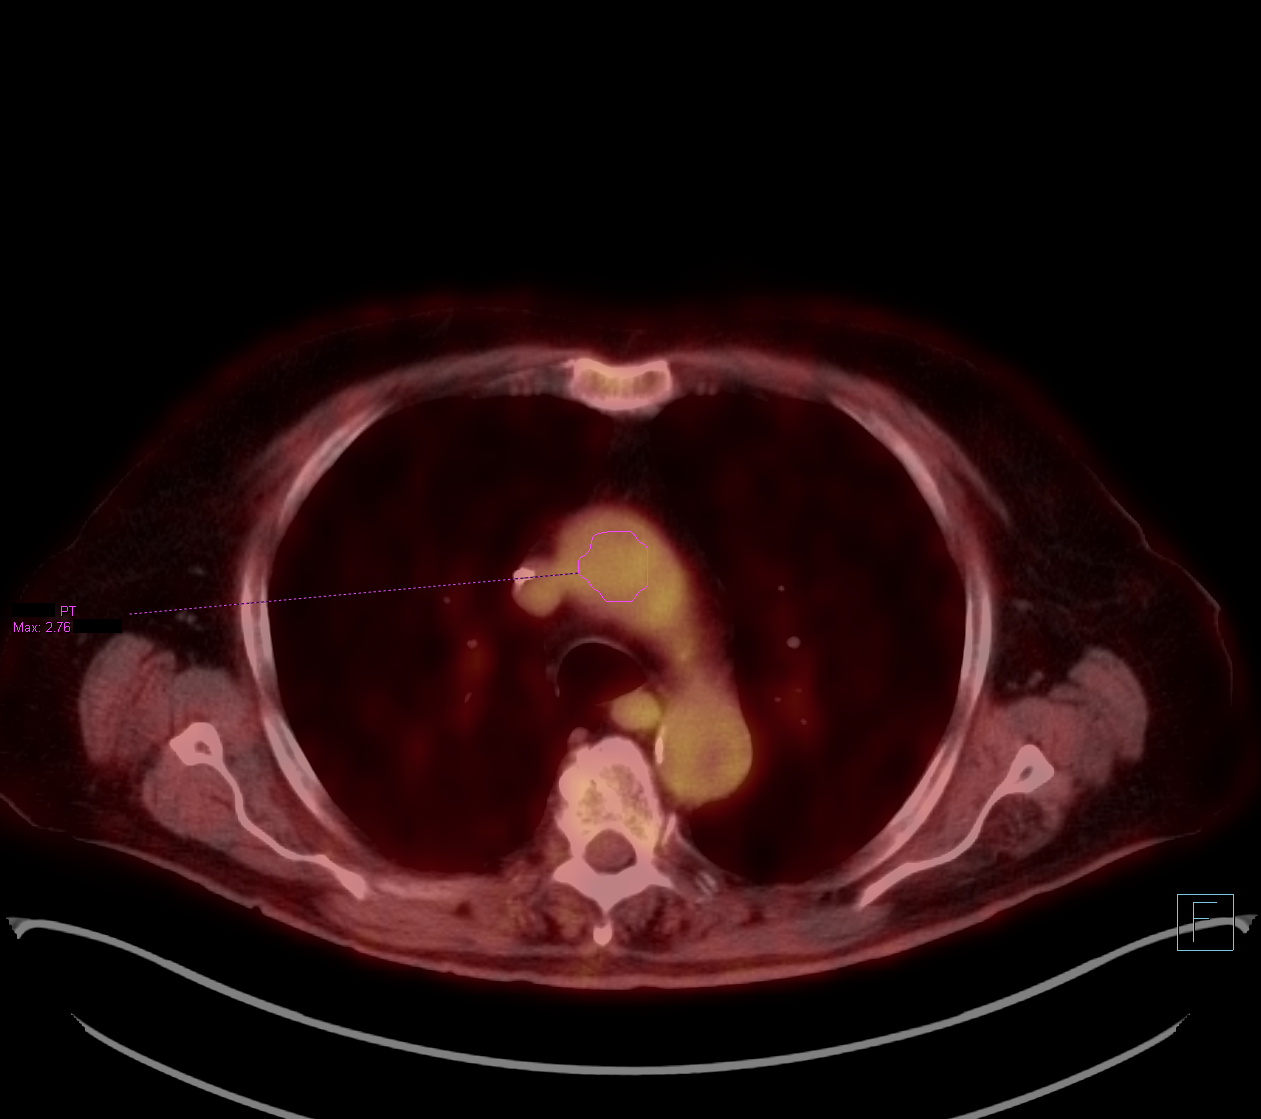

[23 of 25 positions shown; findings below may reference images not displayed]

FINDINGS: Mediastinal blood pool activity: SUV max

NECK: No hypermetabolic lymph nodes in the neck.

Incidental CT findings: none

CHEST: No hypermetabolic supraclavicular or axillary lymph nodes. No
hypermetabolic mediastinal or hilar lymph nodes.

Incidental CT findings: Stable tiny nodule in the posterior right
lower lobe measuring 3 mm. Scarring and volume loss within the
lingula is unchanged. No suspicious pulmonary nodules identified.
Aortic atherosclerosis. Calcifications within the RCA, left
circumflex and LAD coronary arteries.

ABDOMEN/PELVIS: No abnormal hypermetabolic activity within the
liver, pancreas, adrenal glands, or spleen. No hypermetabolic lymph
nodes in the abdomen or pelvis.

Incidental CT findings: Previous cholecystectomy. Bilateral kidney
cysts identified. Aortic atherosclerosis. Suprapubic catheter.
Prostate gland enlargement.

SKELETON: No focal hypermetabolic activity to suggest skeletal
metastasis.

Incidental CT findings: none
IMPRESSION: 1. No findings identified to suggest residual or recurrent lymphoma.
2.  Aortic Atherosclerosis ([id number]).
3. Stable incidental findings including multi vessel coronary artery
atherosclerotic calcifications, kidney cysts, and prostate gland
enlargement.

## 2019-03-25 NOTE — Telephone Encounter (Signed)
Son is aware.

## 2019-03-25 NOTE — Telephone Encounter (Signed)
With his hx of CAD, CKD, and coumadin therapy I do not recommend any NSAID.  If Tylenol is not adequate for pain control he will need to address with them or [place] pain medication.  Would try to minimize additional medications as much as possible.  Keep hand elevated should help some.

## 2019-03-26 NOTE — Telephone Encounter (Signed)
Refill for 6 months. 

## 2019-04-03 ENCOUNTER — Other Ambulatory Visit: Payer: Self-pay | Admitting: Family Medicine

## 2019-04-08 ENCOUNTER — Ambulatory Visit (INDEPENDENT_AMBULATORY_CARE_PROVIDER_SITE_OTHER): Payer: Medicare Other | Admitting: General Practice

## 2019-04-08 DIAGNOSIS — Z7901 Long term (current) use of anticoagulants: Secondary | ICD-10-CM

## 2019-04-08 DIAGNOSIS — I480 Paroxysmal atrial fibrillation: Secondary | ICD-10-CM

## 2019-04-08 LAB — POCT INR: INR: 3 (ref 2.0–3.0)

## 2019-04-08 NOTE — Progress Notes (Signed)
Medical screening examination/treatment/procedure(s) were performed by non-physician practitioner and as supervising physician I was immediately available for consultation/collaboration. I agree with above. Brieonna Crutcher, MD   

## 2019-04-08 NOTE — Patient Instructions (Signed)
Pre visit review using our clinic review tool, if applicable. No additional management support is needed unless otherwise documented below in the visit note.  Take 1/2 tablet today and then continue to take 5 mg daily except 2.5 mg on Mon, Wed and Fridays.  Re-check in 2 weeks.  Dosing instructions and VO given to [email or web address]  while in patient's home.  Add 2 to 3 additional servings of green vegetables per week. [phone].

## 2019-04-09 ENCOUNTER — Other Ambulatory Visit: Payer: Medicare Other

## 2019-04-09 ENCOUNTER — Ambulatory Visit: Payer: Medicare Other | Admitting: Hematology & Oncology

## 2019-04-14 ENCOUNTER — Telehealth: Payer: Self-pay | Admitting: Hematology & Oncology

## 2019-04-14 ENCOUNTER — Inpatient Hospital Stay (HOSPITAL_BASED_OUTPATIENT_CLINIC_OR_DEPARTMENT_OTHER): Payer: Medicare Other | Admitting: Hematology & Oncology

## 2019-04-14 ENCOUNTER — Inpatient Hospital Stay: Payer: Medicare Other

## 2019-04-14 ENCOUNTER — Encounter: Payer: Self-pay | Admitting: Hematology & Oncology

## 2019-04-14 ENCOUNTER — Inpatient Hospital Stay: Payer: Medicare Other | Attending: Hematology & Oncology

## 2019-04-14 ENCOUNTER — Other Ambulatory Visit: Payer: Self-pay

## 2019-04-14 VITALS — BP 132/77 | HR 82 | Temp 97.1°F | Resp 18

## 2019-04-14 VITALS — Wt 228.0 lb

## 2019-04-14 DIAGNOSIS — D500 Iron deficiency anemia secondary to blood loss (chronic): Secondary | ICD-10-CM

## 2019-04-14 DIAGNOSIS — C8589 Other specified types of non-Hodgkin lymphoma, extranodal and solid organ sites: Secondary | ICD-10-CM

## 2019-04-14 DIAGNOSIS — F0390 Unspecified dementia without behavioral disturbance: Secondary | ICD-10-CM | POA: Diagnosis not present

## 2019-04-14 DIAGNOSIS — Z7901 Long term (current) use of anticoagulants: Secondary | ICD-10-CM | POA: Diagnosis not present

## 2019-04-14 DIAGNOSIS — N289 Disorder of kidney and ureter, unspecified: Secondary | ICD-10-CM | POA: Insufficient documentation

## 2019-04-14 DIAGNOSIS — D509 Iron deficiency anemia, unspecified: Secondary | ICD-10-CM | POA: Insufficient documentation

## 2019-04-14 DIAGNOSIS — Z923 Personal history of irradiation: Secondary | ICD-10-CM | POA: Insufficient documentation

## 2019-04-14 DIAGNOSIS — C8599 Non-Hodgkin lymphoma, unspecified, extranodal and solid organ sites: Secondary | ICD-10-CM | POA: Diagnosis not present

## 2019-04-14 DIAGNOSIS — Z95828 Presence of other vascular implants and grafts: Secondary | ICD-10-CM

## 2019-04-14 DIAGNOSIS — D631 Anemia in chronic kidney disease: Secondary | ICD-10-CM

## 2019-04-14 DIAGNOSIS — R600 Localized edema: Secondary | ICD-10-CM | POA: Insufficient documentation

## 2019-04-14 DIAGNOSIS — N1831 Chronic kidney disease, stage 3a: Secondary | ICD-10-CM

## 2019-04-14 DIAGNOSIS — R531 Weakness: Secondary | ICD-10-CM | POA: Diagnosis not present

## 2019-04-14 DIAGNOSIS — Z79899 Other long term (current) drug therapy: Secondary | ICD-10-CM | POA: Insufficient documentation

## 2019-04-14 DIAGNOSIS — Z7984 Long term (current) use of oral hypoglycemic drugs: Secondary | ICD-10-CM | POA: Insufficient documentation

## 2019-04-14 LAB — IRON AND TIBC
Iron: 58 ug/dL (ref 42–163)
Saturation Ratios: 30 % (ref 20–55)
TIBC: 194 ug/dL — ABNORMAL LOW (ref 202–409)
UIBC: 135 ug/dL (ref 117–376)

## 2019-04-14 LAB — CMP (CANCER CENTER ONLY)
ALT: 4 U/L (ref 0–44)
AST: 12 U/L — ABNORMAL LOW (ref 15–41)
Albumin: 3.6 g/dL (ref 3.5–5.0)
Alkaline Phosphatase: 81 U/L (ref 38–126)
Anion gap: 8 (ref 5–15)
BUN: 24 mg/dL — ABNORMAL HIGH (ref 8–23)
CO2: 24 mmol/L (ref 22–32)
Calcium: 9.2 mg/dL (ref 8.9–10.3)
Chloride: 104 mmol/L (ref 98–111)
Creatinine: 1.41 mg/dL — ABNORMAL HIGH (ref 0.61–1.24)
GFR, Est AFR Am: 53 mL/min — ABNORMAL LOW (ref >60)
GFR, Estimated: 45 mL/min — ABNORMAL LOW (ref >60)
Glucose, Bld: 125 mg/dL — ABNORMAL HIGH (ref 70–99)
Potassium: 4 mmol/L (ref 3.5–5.1)
Sodium: 136 mmol/L (ref 135–145)
Total Bilirubin: 0.4 mg/dL (ref 0.3–1.2)
Total Protein: 6.7 g/dL (ref 6.5–8.1)

## 2019-04-14 LAB — CBC WITH DIFFERENTIAL (CANCER CENTER ONLY)
Abs Immature Granulocytes: 0.02 10*3/uL (ref 0.00–0.07)
Basophils Absolute: 0.1 10*3/uL (ref 0.0–0.1)
Basophils Relative: 1 %
Eosinophils Absolute: 0.2 10*3/uL (ref 0.0–0.5)
Eosinophils Relative: 3 %
HCT: 28.8 % — ABNORMAL LOW (ref 39.0–52.0)
Hemoglobin: 9.5 g/dL — ABNORMAL LOW (ref 13.0–17.0)
Immature Granulocytes: 0 %
Lymphocytes Relative: 27 %
Lymphs Abs: 1.5 10*3/uL (ref 0.7–4.0)
MCH: 31.8 pg (ref 26.0–34.0)
MCHC: 33 g/dL (ref 30.0–36.0)
MCV: 96.3 fL (ref 80.0–100.0)
Monocytes Absolute: 0.6 10*3/uL (ref 0.1–1.0)
Monocytes Relative: 11 %
Neutro Abs: 3.2 10*3/uL (ref 1.7–7.7)
Neutrophils Relative %: 58 %
Platelet Count: 136 10*3/uL — ABNORMAL LOW (ref 150–400)
RBC: 2.99 MIL/uL — ABNORMAL LOW (ref 4.22–5.81)
RDW: 13.6 % (ref 11.5–15.5)
WBC Count: 5.6 10*3/uL (ref 4.0–10.5)
nRBC: 0 % (ref 0.0–0.2)

## 2019-04-14 LAB — LACTATE DEHYDROGENASE: LDH: 222 U/L — ABNORMAL HIGH (ref 98–192)

## 2019-04-14 LAB — FERRITIN: Ferritin: 649 ng/mL — ABNORMAL HIGH (ref 24–336)

## 2019-04-14 MED ORDER — HEPARIN SOD (PORK) LOCK FLUSH 100 UNIT/ML IV SOLN
500.0000 [IU] | Freq: Once | INTRAVENOUS | Status: AC
  Administered 2019-04-14: 500 [IU] via INTRAVENOUS
  Filled 2019-04-14: qty 5

## 2019-04-14 MED ORDER — SODIUM CHLORIDE 0.9 % IV SOLN
Freq: Once | INTRAVENOUS | Status: DC
  Filled 2019-04-14: qty 250

## 2019-04-14 MED ORDER — EPOETIN ALFA-EPBX 40000 UNIT/ML IJ SOLN
40000.0000 [IU] | Freq: Once | INTRAMUSCULAR | Status: AC
  Administered 2019-04-14: 40000 [IU] via SUBCUTANEOUS

## 2019-04-14 MED ORDER — EPOETIN ALFA-EPBX 40000 UNIT/ML IJ SOLN
INTRAMUSCULAR | Status: AC
  Filled 2019-04-14: qty 1

## 2019-04-14 MED ORDER — SODIUM CHLORIDE 0.9% FLUSH
10.0000 mL | INTRAVENOUS | Status: DC | PRN
  Administered 2019-04-14: 10 mL via INTRAVENOUS
  Filled 2019-04-14: qty 10

## 2019-04-14 NOTE — Telephone Encounter (Signed)
Appointments scheduled calendar printed & mailed per 4/7 los 

## 2019-04-14 NOTE — Patient Instructions (Signed)

## 2019-04-14 NOTE — Progress Notes (Signed)
Hematology and Oncology Follow Up Visit  Gary Yates [id number] 12-05-1933 86 y.o. 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  He actually is doing quite well.  He says he feels pretty good.  We last saw him right before Christmas.  He had no problems over the winter.  His heart is doing okay.  He has a chronic atrial fibrillation.  He is on Coumadin.  He has had no issues with bleeding.  The big problem is his poor wife.  She has progressive dementia.  She is becoming less coherent.  This is becoming more of a problem for him.  Is becoming more challenging to take care of her.  He has had no issues with bowels or bladder.  He has some chronic leg swelling.  There is been no change in his medications.  He has had no headache.  Overall, his performance status is ECOG 2-3.    Medications:  Current Outpatient Medications:  .  acetaminophen (TYLENOL) 500 MG tablet, Take 1,000 mg by mouth every 6 (six) hours as needed (for pain/fever/headaches.). , Disp: , Rfl:  .  atorvastatin (LIPITOR) 40 MG tablet, Take 1 tablet (40 mg total) by mouth daily., Disp: 30 tablet, Rfl: 2 .  docusate sodium (COLACE) 50 MG capsule, Take 1 capsule (50 mg total) by mouth 2 (two) times daily as needed (for constipation)., Disp: 90 capsule, Rfl: 0 .  furosemide (LASIX) 40 MG tablet, Take 1 tablet (40 mg total) by mouth every other day. Take one tablet by mouth every other day., Disp: 45 tablet, Rfl: 1 .   gabapentin (NEURONTIN) 300 MG capsule, TAKE 2 CAPSULES BY MOUTH 3 TIMES A DAY (Patient taking differently: 900 mg 2 (two) times daily. ), Disp: 540 capsule, Rfl: 1 .  glucose blood (ACCU-CHEK AVIVA PLUS) test strip, 1 each by Other route 3 (three) times daily. for testing, Disp: 300 each, Rfl: 1 .  KLOR-CON M20 20 MEQ tablet, TAKE 1 TABLET BY MOUTH EVERY DAY, Disp: 30 tablet, Rfl: 1 .  Melatonin 1 MG TABS, Take 1 tablet (1 mg total) by mouth at bedtime. (Patient taking differently: Take 10 mg by mouth at bedtime. ), Disp: 30 tablet, Rfl: 2 .  metFORMIN (GLUCOPHAGE) 500 MG tablet, TAKE 1 TABLET BY MOUTH TWICE A DAY WITH MEALS, Disp: 180 tablet, Rfl: 1 .  metoprolol succinate (TOPROL-XL) 25 MG 24 hr tablet, Take 1 tablet (25 mg total) by mouth daily., Disp: 90 tablet, Rfl: 3 .  nitroGLYCERIN (NITROSTAT) 0.4 MG SL tablet, Place 1 tablet (0.4 mg total) under the tongue every 5 (five) minutes as needed. Chest pain, Disp: 25 tablet, Rfl: 6 .  nystatin (MYCOSTATIN/NYSTOP) powder, APPLY TO AFFECTED AREA 4 TIMES A DAY, Disp: 60 g, Rfl: 0 .  nystatin-triamcinolone ointment (MYCOLOG), Apply 1 application topically 2 (two) times daily., Disp: 15 g, Rfl: 0 .  pramipexole (MIRAPEX) 1.5 MG tablet, TAKE 1 TABLET BY MOUTH TWICE A DAY, Disp: 60 tablet, Rfl: 2 .  warfarin (COUMADIN) 5 MG tablet, Take 1 tablet daily except take 1 1/2 tablets on Sundays only or Take as directed by anticoagulation clinic. (Patient taking differently: 04/14/2019 Takes 5 mg daily except M,W,F takes 2.3m.), Disp: 100 tablet, Rfl: 1 .  pantoprazole (PROTONIX) 40 MG tablet, TAKE 1 TABLET BY MOUTH EVERY DAY (Patient not taking: Reported on 04/14/2019), Disp: 90 tablet, Rfl: 3 .  polyethylene glycol (MIRALAX / GLYCOLAX) packet, Take 17 g by mouth daily. (Patient not taking: Reported on 04/14/2019), Disp: 14 each, Rfl: 0 No current facility-administered medications for this visit.  Facility-Administered Medications Ordered in Other Visits:  .   acetaminophen (TYLENOL) tablet 650 mg, 650 mg, Oral, Once, [place], Sarah M, NP .  pegfilgrastim (NEULASTA ONPRO KIT) injection 6 mg, 6 mg, Subcutaneous, Once, Makenli Derstine R, MD .  sodium chloride flush (NS) 0.9 % injection 10 mL, 10 mL, Intracatheter, PRN, EVolanda Napoleon MD, 10 mL at 02/27/17 1503 .  sodium chloride flush (NS) 0.9 % injection 10 mL, 10 mL, Intravenous, PRN, EVolanda Napoleon MD, 10 mL at 05/07/17 04742.  sodium chloride flush (NS) 0.9 % injection 10 mL, 10 mL, Intravenous, PRN, [place], SHolli Humbles NP, 10 mL at 04/14/19 05956 Allergies:  Allergies  Allergen Reactions  . Ace Inhibitors Other (See Comments)    cough  . Codeine Nausea Only and Rash       . Penicillins Rash    Childhood allergy Has patient had a PCN reaction causing immediate rash, facial/tongue/throat swelling, SOB or lightheadedness with hypotension: Yes Has patient had a PCN reaction causing severe rash involving mucus membranes or skin necrosis: Yes Has patient had a PCN reaction that required hospitalization No Has patient had a PCN reaction occurring within the last 10 years: No If all of the above answers are "NO", then may proceed with Cephalosporin use.     Past Medical History, Surgical history, Social history, and Family History were reviewed and updated.  Review of Systems: Review of Systems  HENT: Positive for sinus pain and sore throat.   Eyes: Positive for blurred vision.  Respiratory: Positive for wheezing.   Cardiovascular: Positive for leg swelling.  Gastrointestinal: Negative.   Genitourinary: Negative.   Musculoskeletal: Positive for back pain.  Skin: Negative.   Neurological: Positive for weakness.  Endo/Heme/Allergies: Negative.   Psychiatric/Behavioral: Negative.     Physical Exam:  weight is 228 lb (103.4 kg).   Wt Readings from Last 3 Encounters:  04/14/19 228 lb (103.4 kg)  03/17/19 234 lb 3.2 oz (106.2 kg)  12/09/18 219 lb (99.3 kg)      Physical  Exam Vitals reviewed.  HENT:     Head: Normocephalic and atraumatic.  Eyes:     Pupils: Pupils are equal, round, and reactive to light.  Cardiovascular:     Rate and Rhythm: Normal rate and regular rhythm.     Heart sounds: Normal heart sounds.  Pulmonary:     Effort: Pulmonary effort is normal.     Breath sounds: Normal breath sounds.  Abdominal:     General: Bowel sounds are normal.     Palpations: Abdomen is soft.  Musculoskeletal:        General: No tenderness or deformity. Normal range of motion.     Cervical back: Normal range of motion.  Lymphadenopathy:     Cervical: No cervical adenopathy.  Skin:    General: Skin is warm and dry.     Findings: No erythema or rash.  Neurological:     Mental Status: He is alert and oriented to person, place, and time.  Psychiatric:        Behavior: Behavior normal.        Thought Content: Thought content normal.        Judgment: Judgment normal.      Lab Results  Component Value Date   WBC 5.6 04/14/2019   HGB 9.5 (L) 04/14/2019   HCT 28.8 (L) 04/14/2019   MCV 96.3 04/14/2019   PLT 136 (L) 04/14/2019     Chemistry      Component Value Date/Time   NA 137 03/22/2019 0747   NA 141 01/10/2017 1136   NA 137 12/14/2015 1100   K 4.0 03/22/2019 0747   K 4.4 01/10/2017 1136   K 3.7 12/14/2015 1100   CL 96 03/22/2019 0747   CL 104 01/10/2017 1136   CO2 32 03/22/2019 0747   CO2 27 01/10/2017 1136   CO2 24 12/14/2015 1100   BUN 36 (H) 03/22/2019 0747   BUN 19 01/10/2017 1136   BUN 18.4 12/14/2015 1100   CREATININE 1.72 (H) 03/22/2019 0747   CREATININE 1.19 12/09/2018 1200   CREATININE 1.1 01/10/2017 1136   CREATININE 0.9 12/14/2015 1100      Component Value Date/Time   CALCIUM 9.3 03/22/2019 0747   CALCIUM 9.4 01/10/2017 1136   CALCIUM 9.5 12/14/2015 1100   ALKPHOS 91 03/22/2019 0747   ALKPHOS 112 (H) 01/10/2017 1136   ALKPHOS 102 12/14/2015 1100   AST 13 03/22/2019 0747   AST 18 12/09/2018 1200   AST 22 12/14/2015  1100   ALT 4 03/22/2019 0747   ALT 8 12/09/2018 1200   ALT 18 01/10/2017 1136   ALT 10 12/14/2015 1100   BILITOT 0.5 03/22/2019 0747   BILITOT 0.4 12/09/2018 1200   BILITOT 0.58 12/14/2015 1100      Impression and Plan: Mr. Vannote is an 86 year old white male. He has relapsed large cell non-Hodgkin lymphoma of the testicle. He was on salvage chemotherapy with Rituxan/bendamustine/Velcade.  He did not really respond well to this.  We then treated him with dose reduced R-ICE.  He responded very well to this.  His last PET scan showed that he was in remission.    Today, we have to give him any Aranesp.  I am sure that this will help him.    We will see what his iron levels are.   I will see him back in 4 months now.  He will come back in 2 months for a Port-A-Cath flush    Volanda Napoleon, MD 4/7/20218:44 AM

## 2019-04-14 NOTE — Addendum Note (Signed)
Addended by: Melton Krebs on: 04/14/2019 08:38 AM   Modules accepted: Orders, SmartSet

## 2019-04-19 ENCOUNTER — Other Ambulatory Visit: Payer: Self-pay | Admitting: Family Medicine

## 2019-04-21 ENCOUNTER — Other Ambulatory Visit: Payer: Self-pay

## 2019-04-21 ENCOUNTER — Encounter: Payer: Self-pay | Admitting: Cardiovascular Disease

## 2019-04-21 ENCOUNTER — Ambulatory Visit: Payer: Medicare Other | Admitting: Cardiovascular Disease

## 2019-04-21 VITALS — BP 106/60 | HR 78 | Ht 70.0 in | Wt 228.0 lb

## 2019-04-21 DIAGNOSIS — I5032 Chronic diastolic (congestive) heart failure: Secondary | ICD-10-CM

## 2019-04-21 DIAGNOSIS — I480 Paroxysmal atrial fibrillation: Secondary | ICD-10-CM

## 2019-04-21 DIAGNOSIS — I2510 Atherosclerotic heart disease of native coronary artery without angina pectoris: Secondary | ICD-10-CM

## 2019-04-21 NOTE — Progress Notes (Signed)
=   Chief Complaint  Patient presents with  . Follow-up    CAD   History of Present Illness: 86 yo male with history of HTN, hyperlipidemia, morbid obesity, DM, CAD, atrial fibrillation, carotid artery disease and COPD who is here for follow up. He has CAD with remote stenting of the RCA in 2004. He was [facility] to [facility] June 2013 with chest pain. Cardiac cath on 06/18/11 with moderate LAD disease with patent RCA stents with no flow limiting lesions. Normal LV function by cath. He was found to be in atrial fibrillation. He was started on Xarelto but changed to coumadin due to cost. Slurred speech in June 2013. Head CT without evidence of CVA. Head MRI with questionable tiny acute infarct posterior left frontal lobe, mild small vessel disease type changes, global atrophy without hydrocephalus. He is known to have very mild bilateral carotid artery disease (last dopplers November 2016). He was seen 12/23/12 and had c/o chest pressure when working in the yard. Stress myoview 01/13/13 with small inferior wall defect from base to apex with mild peri-infarct ischemia. LVEF=53%. Cardiac cath 01/27/13 with stable disease. Admitted to [facility] 02/03/13 with chest pain. Mild elevation troponin. Started on Imdur and Plavix. Carotid dopplers with mild bilateral disease. He has been diagnosed with non-Hodgkins lymphoma treated with chemotherapy. He had involvement of his adrenal gland and he had radiation therapy. He has had testicular surgery followed by radiation. Echo June 2019 with LVEF=55-60%. Mild aortic stenosis (mean gradient 8 mmHg). He has been in remission since February 2019. His Toprol was lowered due to fatigue and soft BP.   He is here today for follow up. The patient denies any chest pain, dyspnea, palpitations, lower extremity edema, orthopnea, PND, dizziness, near syncope or syncope.   Primary Care Physician: [name], MD   Past Medical History:  Diagnosis Date  . Anemia in chronic renal  disease 05/07/2017  . Anxiety   . Atrial fibrillation ([place])   . COPD (chronic obstructive pulmonary disease) ([place])    pt. denies  . Coronary artery disease    a. h/o Overlapping stents RCA;  b. 06/2011 Cath: patent stents, nonobs dzs, NL EF.  . Diabetic peripheral neuropathy ([place])   . Diffuse non-Hodgkin's lymphoma of testis ([place]) 09/28/2015  . DM (diabetes mellitus) ([place])    Type 2, peripheral neuropathy.  . Dyspnea    with exertion  . Dysrhythmia   . GERD (gastroesophageal reflux disease)   . Headache   . History of bronchitis   . History of kidney stones   . History of radiation therapy 02/19/16 - 03/13/16   Testis/Scrotum: 32.4 Gy in 18 fractions  . History of radiation therapy 08/07/16-08/20/16   left adrenal gland mass treated to 30 Gy in 10 fractions  . Hyperlipidemia   . Hypertension   . Iron deficiency anemia due to chronic blood loss 08/08/2017  . Low testosterone   . Nephrolithiasis   . OSA (obstructive sleep apnea) 11/26/2017  . Osteoarthritis    shoulder  . Restless leg   . SVT (supraventricular tachycardia) ([place])   . Urinary frequency   . Wears partial dentures    upper and lower    Past Surgical History:  Procedure Laterality Date  . APPENDECTOMY    . [place]  . CARDIAC CATHETERIZATION  01/2013  . CATARACT EXTRACTION, BILATERAL    . CHOLECYSTECTOMY    . COLONOSCOPY    . CORONARY ANGIOPLASTY  2004  .  CYSTOSCOPY N/A 08/18/2017   Procedure: CYSTOSCOPY WITH FULGURATION AND SUPRA PUBIC TUBE PLACEMENT;  Surgeon: Kathie Rhodes, MD;  Location: [facility] ORS;  Service: Urology;  Laterality: N/A;  . EYE SURGERY Bilateral    cataracts  . IR GENERIC HISTORICAL  10/05/2015   IR US GUIDE VASC ACCESS RIGHT 10/05/2015 Marybelle Killings, MD [facility]-INTER[facility] RAD  . IR GENERIC HISTORICAL  10/05/2015   IR FLUORO GUIDE PORT INSERTION RIGHT 10/05/2015 Marybelle Killings, MD [facility]  . LEFT HEART CATHETERIZATION WITH CORONARY ANGIOGRAM N/A 06/18/2011   Procedure: LEFT HEART CATHETERIZATION  WITH CORONARY ANGIOGRAM;  Surgeon: Peter M Martinique, MD;  Location: [facility] CATH LAB;  Service: Cardiovascular;  Laterality: N/A;  . LEFT HEART CATHETERIZATION WITH CORONARY ANGIOGRAM N/A 01/27/2013   Procedure: LEFT HEART CATHETERIZATION WITH CORONARY ANGIOGRAM;  Surgeon: Burnell Blanks, MD;  Location: [facility] CATH LAB;  Service: Cardiovascular;  Laterality: N/A;  . LUMBAR LAMINECTOMY/DECOMPRESSION MICRODISCECTOMY N/A 02/07/2015   Procedure: Lumbar three-Sacral one Decompression;  Surgeon: Kevan Ny Ditty, MD;  Location: [facility] NEURO ORS;  Service: Neurosurgery;  Laterality: N/A;  L3 to S1 Decompression  . MULTIPLE TOOTH EXTRACTIONS    . ORCHIECTOMY Right 09/01/2015   Procedure: RIGHT ORCHIECTOMY;  Surgeon: Kathie Rhodes, MD;  Location: [facility] ORS;  Service: Urology;  Laterality: Right;  . port a cath in place     . ROTATOR CUFF REPAIR Left     Current Outpatient Medications  Medication Sig Dispense Refill  . acetaminophen (TYLENOL) 500 MG tablet Take 1,000 mg by mouth every 6 (six) hours as needed (for pain/fever/headaches.).     Marland Kitchen atorvastatin (LIPITOR) 40 MG tablet Take 1 tablet (40 mg total) by mouth daily. 30 tablet 2  . docusate sodium (COLACE) 50 MG capsule Take 1 capsule (50 mg total) by mouth 2 (two) times daily as needed (for constipation). 90 capsule 0  . furosemide (LASIX) 40 MG tablet Take 1 tablet (40 mg total) by mouth every other day. Take one tablet by mouth every other day. 45 tablet 1  . gabapentin (NEURONTIN) 300 MG capsule TAKE 2 CAPSULES BY MOUTH 3 TIMES A DAY (Patient taking differently: 900 mg 2 (two) times daily. ) 540 capsule 1  . glucose blood (ACCU-CHEK AVIVA PLUS) test strip 1 each by Other route 3 (three) times daily. for testing 300 each 1  . KLOR-CON M20 20 MEQ tablet TAKE 1 TABLET BY MOUTH EVERY DAY 30 tablet 1  . Melatonin 1 MG TABS Take 1 tablet (1 mg total) by mouth at bedtime. (Patient taking differently: Take 10 mg by mouth at bedtime. ) 30 tablet 2  . metFORMIN  (GLUCOPHAGE) 500 MG tablet TAKE 1 TABLET BY MOUTH TWICE A DAY WITH MEALS 180 tablet 1  . metoprolol succinate (TOPROL-XL) 25 MG 24 hr tablet Take 1 tablet (25 mg total) by mouth daily. 90 tablet 3  . nitroGLYCERIN (NITROSTAT) 0.4 MG SL tablet Place 1 tablet (0.4 mg total) under the tongue every 5 (five) minutes as needed. Chest pain 25 tablet 6  . nystatin (MYCOSTATIN/NYSTOP) powder APPLY TO AFFECTED AREA 4 TIMES A DAY 60 g 0  . nystatin-triamcinolone ointment (MYCOLOG) Apply 1 application topically 2 (two) times daily. 15 g 0  . pantoprazole (PROTONIX) 40 MG tablet TAKE 1 TABLET BY MOUTH EVERY DAY 90 tablet 3  . polyethylene glycol (MIRALAX / GLYCOLAX) packet Take 17 g by mouth daily. 14 each 0  . pramipexole (MIRAPEX) 1.5 MG tablet TAKE 2 TABLETS BY MOUTH EVERY EVENING 180 tablet 1  .  warfarin (COUMADIN) 5 MG tablet Take 1 tablet daily except take 1 1/2 tablets on Sundays only or Take as directed by anticoagulation clinic. (Patient taking differently: 04/14/2019 Takes 5 mg daily except M,W,F takes 2.28m.) 100 tablet 1   No current facility-administered medications for this visit.   Facility-Administered Medications Ordered in Other Visits  Medication Dose Route Frequency Provider Last Rate Last Admin  . acetaminophen (TYLENOL) tablet 650 mg  650 mg Oral Once CDortches Sarah M, NP      . pegfilgrastim (NEULASTA ONPRO KIT) injection 6 mg  6 mg Subcutaneous Once EVolanda Napoleon MD      . sodium chloride flush (NS) 0.9 % injection 10 mL  10 mL Intracatheter PRN EVolanda Napoleon MD   10 mL at 02/27/17 1503  . sodium chloride flush (NS) 0.9 % injection 10 mL  10 mL Intravenous PRN EVolanda Napoleon MD   10 mL at 05/07/17 03903 . sodium chloride flush (NS) 0.9 % injection 10 mL  10 mL Intravenous PRN CEliezer Bottom NP   10 mL at 04/14/19 00092   Allergies  Allergen Reactions  . Ace Inhibitors Other (See Comments)    cough  . Codeine Nausea Only and Rash       . Penicillins Rash     Childhood allergy Has patient had a PCN reaction causing immediate rash, facial/tongue/throat swelling, SOB or lightheadedness with hypotension: Yes Has patient had a PCN reaction causing severe rash involving mucus membranes or skin necrosis: Yes Has patient had a PCN reaction that required hospitalization No Has patient had a PCN reaction occurring within the last 10 years: No If all of the above answers are "NO", then may proceed with Cephalosporin use.     Social History   Socioeconomic History  . Marital status: Married    Spouse name: Not on file  . Number of children: 3  . Years of education: Not on file  . Highest education level: Not on file  Occupational History  . 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 Smoking status: Former Smoker    Packs/day: 1.50    Years: 20.00    Pack years: 30.00    Types: Cigarettes    Quit date: 04/05/1978    Years since quitting: 41.0  . Smokeless tobacco: Never Used  Substance and Sexual Activity  . Alcohol use: No  . Drug use: No  . Sexual activity: Not Currently  Other Topics Concern  . Not on file  Social History Narrative  . Not on file   Social Determinants of Health   Financial Resource Strain:   . Difficulty of Paying Living Expenses:   Food Insecurity:   . Worried About RCharity fundraiserin the Last Year:   . RArboriculturistin the Last Year:   Transportation Needs:   . LFilm/video editor(Medical):   .Marland KitchenLack of Transportation (Non-Medical):   Physical Activity:   . Days of Exercise per Week:   . Minutes of Exercise per Session:   Stress:   . Feeling of Stress :   Social Connections:   . Frequency of Communication with Friends and Family:   . Frequency of Social Gatherings with Friends and Family:   . Attends Religious Services:   . Active Member of Clubs or Organizations:   . Attends CArchivistMeetings:   .Marland KitchenMarital Status:   Intimate Partner Violence:   .  Fear of Current or  Ex-Partner:   . Emotionally Abused:   Marland Kitchen Physically Abused:   . Sexually Abused:     Family History  Problem Relation Age of Onset  . Alzheimer's disease Mother   . Heart disease Mother   . Heart disease Father   . Migraines Father   . Ulcers Father   . Prostate cancer Brother   . Coronary artery disease Other        Male 1st degree relative <50  . Coronary artery disease Other        male 1st degree relative <60  . Heart disease Sister   . Obesity Sister        Morbid  . Arthritis Sister   . Heart disease Brother   . Arthritis Brother   . Sleep apnea Son   . Obesity Son   . Migraines Daughter   . Thyroid disease Daughter     Review of Systems:  As stated in the HPI and otherwise negative.   BP 106/60   Pulse 78   Ht _0  (1.778 m)   Wt 228 lb (103.4 kg)   SpO2 96%   BMI 32.71 kg/m   Physical Examination: General: Well developed, well nourished, NAD  HEENT: OP clear, mucus membranes moist  SKIN: warm, dry. No rashes. Neuro: No focal deficits  Musculoskeletal: Muscle strength 5/5 all ext  Psychiatric: Mood and affect normal  Neck: No JVD, no carotid bruits, no thyromegaly, no lymphadenopathy.  Lungs:Clear bilaterally, no wheezes, rhonci, crackles Cardiovascular: Regular rate and rhythm. No murmurs, gallops or rubs. Abdomen:Soft. Bowel sounds present. Non-tender.  Extremities: No lower extremity edema. Pulses are 2 + in the bilateral DP/PT.  Echo June 2019: Left ventricle: The cavity size was normal. Wall thickness was   normal. Systolic function was normal. The estimated ejection   fraction was in the range of 55% to 60%. There was an increased   relative contribution of atrial contraction to ventricular   filling. Doppler parameters are consistent with abnormal left   ventricular relaxation (grade 1 diastolic dysfunction). - Aortic valve: Moderately thickened, moderately calcified   leaflets. Valve area (VTI): 2.26 cm^2. Valve area (Vmax): 2.02   cm^2.  Valve area (Vmean): 2.02 cm^2. - Mitral valve: Calcified annulus. - Atrial septum: No defect or patent foramen ovale was identified. - Tricuspid valve: There was mild regurgitation  Cardiac cath 01/27/13: Left main: No obstructive disease.  Left Anterior Descending Artery: Large caliber vessel in the proximal segment with trifurcation in the mid segment into a large septal perforating branch, moderate caliber bifurcating diagonal branch and small to moderate caliber continuation of the LAD. There appears to be a 40-50% stenosis at the trifurcation in the mid LAD. The diagonal branch bifurcates. The superior branch of the diagonal after the bifurcation is small in caliber with 70% stenosis.  Circumflex Artery: Large caliber vessel with large caliber obtuse marginal branch. The proximal and mid vessel has diffuse 30% stenosis, unchanged. The obtuse marginal branch has diffuse 30% stenosis in the proximal segment of the vessel.  Right Coronary Artery: Large caliber dominant vessel with patent proximal and mid stented segment with 20% stent restenosis. Mild plaque in the large caliber PDA.   EKG:  EKG is ordered today. The ekg ordered today demonstrates sinus, RBBB  Recent Labs: 08/05/2018: TSH 4.795 04/14/2019: ALT 4; BUN 24; Creatinine 1.41; Hemoglobin 9.5; Platelet Count 136; Potassium 4.0; Sodium 136   Lipid Panel    Component Value Date/Time  CHOL 166 03/22/2019 0747   CHOL 135 09/15/2017 0953   TRIG 373.0 (H) 03/22/2019 0747   HDL 29.80 (L) 03/22/2019 0747   HDL 37 (L) 09/15/2017 0953   CHOLHDL 6 03/22/2019 0747   VLDL 74.6 (H) 03/22/2019 0747   LDLCALC 53 09/15/2017 0953   LDLDIRECT 70.0 03/22/2019 0747     Wt Readings from Last 3 Encounters:  04/21/19 228 lb (103.4 kg)  04/14/19 228 lb (103.4 kg)  03/17/19 234 lb 3.2 oz (106.2 kg)     Other studies Reviewed: Additional studies/ records that were reviewed today include: . Review of the above records demonstrates:     Assessment and Plan:   1. CAD without angina: He has no chest pain. He is known to have moderate CAD wth last cardiac cath in 2015 with stable disease in the LAD and patent RCA stents. Echo September 2017 with normal LV function. Will continue statin and beta blocker. He is not on ASA or Plavix since he is on coumadin.  2. Paroxysmal atrial fibrillation: sinus today. Continue coumadin. He did not want to use Xarelto or Eliquis due to cost. Continue Toprol.    3. Sleep apnea: He has chosen not to keep his referral for a sleep study.     4. Hyperlipidemia: LDL at goal in March 2021. Continue statin.    5. Chronic diastolic CHF: Weight is stable. No LE edema. Continue Lasix several days per week.    Current medicines are reviewed at length with the patient today.  The patient does not have concerns regarding medicines.  The following changes have been made:    Labs/ tests ordered today include:   Orders Placed This Encounter  Procedures  . EKG 12-Lead    Disposition:   FU with me in 12 months  Signed, Lauree Chandler, MD 04/21/2019 3:11 PM    [facility] [address], [place], [place]  [address] Phone: [phone]; Fax: [phone]

## 2019-04-21 NOTE — Patient Instructions (Signed)

## 2019-05-03 ENCOUNTER — Ambulatory Visit (INDEPENDENT_AMBULATORY_CARE_PROVIDER_SITE_OTHER): Payer: Medicare Other | Admitting: General Practice

## 2019-05-03 DIAGNOSIS — Z7901 Long term (current) use of anticoagulants: Secondary | ICD-10-CM

## 2019-05-03 LAB — POCT INR: INR: 3.4 — AB (ref 2.0–3.0)

## 2019-05-03 NOTE — Patient Instructions (Signed)
Pre visit review using our clinic review tool, if applicable. No additional management support is needed unless otherwise documented below in the visit note.  Skip coumadin today and then change dosage and take  5 mg daily except 2.5 mg on Mon, Wed and Friday and Saturdays.  Re-check in 2 weeks.  Dosing instructions and VO given to [email or web address]  while in patient's home.  [phone].

## 2019-05-04 ENCOUNTER — Other Ambulatory Visit: Payer: Self-pay

## 2019-05-04 ENCOUNTER — Telehealth: Payer: Self-pay | Admitting: Family Medicine

## 2019-05-04 ENCOUNTER — Telehealth (INDEPENDENT_AMBULATORY_CARE_PROVIDER_SITE_OTHER): Payer: Medicare Other | Admitting: Family Medicine

## 2019-05-04 DIAGNOSIS — J069 Acute upper respiratory infection, unspecified: Secondary | ICD-10-CM

## 2019-05-04 NOTE — Progress Notes (Signed)
Patient ID: Gary Mcclaren., male   DOB: 04-Jun-1933, 86 y.o.   MRN: [id number]  This visit type was conducted due to national recommendations for restrictions regarding the COVID-19 pandemic in an effort to limit this patient's exposure and mitigate transmission in our community.   Virtual Visit via Telephone Note  I connected with Gary Yates on 05/04/19 at 10:00 AM EDT by telephone and verified that I am speaking with the correct person using two identifiers.   I discussed the limitations, risks, security and privacy concerns of performing an evaluation and management service by telephone and the availability of in person appointments. I also discussed with the patient that there may be a patient responsible charge related to this service. The patient expressed understanding and agreed to proceed.  Location patient: home Location provider: work or home office Participants present for the call: patient, provider Patient did not have a visit in the prior 7 days to address this/these issue(s).   History of Present Illness: Mr. Klingel has multiple chronic problems including history of CAD, hypertension, type 2 diabetes, atrial fibrillation, COPD, obstructive sleep apnea, history of non-Hodgkin's lymphoma, chronic kidney disease.  He called with onset Saturday of some nasal congestion and cough and low-grade fever around 99.6.  No fever since then.  No body aches.  No nausea or vomiting.  Cough productive of yellow sputum.  No dyspnea.  He states he is already feeling somewhat better today.  He has had both Pfizer Covid vaccines back around mid January and February 8.  Diabetes fairly well controlled.  Fasting blood sugars 120s with recent A1c 7.0%.  Keeping down fluids.  Appetite is good.  No major dyspnea.  Past Medical History:  Diagnosis Date  . Anemia in chronic renal disease 05/07/2017  . Anxiety   . Atrial fibrillation ([place])   . COPD (chronic obstructive pulmonary disease) ([place])    pt.  denies  . Coronary artery disease    a. h/o Overlapping stents RCA;  b. 06/2011 Cath: patent stents, nonobs dzs, NL EF.  . Diabetic peripheral neuropathy ([place])   . Diffuse non-Hodgkin's lymphoma of testis ([place]) 09/28/2015  . DM (diabetes mellitus) ([place])    Type 2, peripheral neuropathy.  . Dyspnea    with exertion  . Dysrhythmia   . GERD (gastroesophageal reflux disease)   . Headache   . History of bronchitis   . History of kidney stones   . History of radiation therapy 02/19/16 - 03/13/16   Testis/Scrotum: 32.4 Gy in 18 fractions  . History of radiation therapy 08/07/16-08/20/16   left adrenal gland mass treated to 30 Gy in 10 fractions  . Hyperlipidemia   . Hypertension   . Iron deficiency anemia due to chronic blood loss 08/08/2017  . Low testosterone   . Nephrolithiasis   . OSA (obstructive sleep apnea) 11/26/2017  . Osteoarthritis    shoulder  . Restless leg   . SVT (supraventricular tachycardia) ([place])   . Urinary frequency   . Wears partial dentures    upper and lower   Past Surgical History:  Procedure Laterality Date  . APPENDECTOMY    . [place]  . CARDIAC CATHETERIZATION  01/2013  . CATARACT EXTRACTION, BILATERAL    . CHOLECYSTECTOMY    . COLONOSCOPY    . CORONARY ANGIOPLASTY  2004  . CYSTOSCOPY N/A 08/18/2017   Procedure: CYSTOSCOPY WITH FULGURATION AND SUPRA PUBIC TUBE PLACEMENT;  Surgeon: Kathie Rhodes, MD;  Location: [facility];  Service: Urology;  Laterality: N/A;  . EYE SURGERY Bilateral    cataracts  . IR GENERIC HISTORICAL  10/05/2015   IR US GUIDE VASC ACCESS RIGHT 10/05/2015 Marybelle Killings, MD [facility]  . IR GENERIC HISTORICAL  10/05/2015   IR FLUORO GUIDE PORT INSERTION RIGHT 10/05/2015 Marybelle Killings, MD [facility]  . LEFT HEART CATHETERIZATION WITH CORONARY ANGIOGRAM N/A 06/18/2011   Procedure: LEFT HEART CATHETERIZATION WITH CORONARY ANGIOGRAM;  Surgeon: Peter M Martinique, MD;  Location: [facility] CATH LAB;  Service: Cardiovascular;  Laterality: N/A;  .  LEFT HEART CATHETERIZATION WITH CORONARY ANGIOGRAM N/A 01/27/2013   Procedure: LEFT HEART CATHETERIZATION WITH CORONARY ANGIOGRAM;  Surgeon: Burnell Blanks, MD;  Location: [facility] CATH LAB;  Service: Cardiovascular;  Laterality: N/A;  . LUMBAR LAMINECTOMY/DECOMPRESSION MICRODISCECTOMY N/A 02/07/2015   Procedure: Lumbar three-Sacral one Decompression;  Surgeon: Kevan Ny Ditty, MD;  Location: [place] NEURO ORS;  Service: Neurosurgery;  Laterality: N/A;  L3 to S1 Decompression  . MULTIPLE TOOTH EXTRACTIONS    . ORCHIECTOMY Right 09/01/2015   Procedure: RIGHT ORCHIECTOMY;  Surgeon: Kathie Rhodes, MD;  Location: [facility];  Service: Urology;  Laterality: Right;  . port a cath in place     . ROTATOR CUFF REPAIR Left     reports that he quit smoking about 41 years ago. His smoking use included cigarettes. He has a 30.00 pack-year smoking history. He has never used smokeless tobacco. He reports that he does not drink alcohol or use drugs. family history includes Alzheimer's disease in his mother; Arthritis in his brother and sister; Coronary artery disease in some other family members; Heart disease in his brother, father, mother, and sister; Migraines in his daughter and father; Obesity in his sister and son; Prostate cancer in his brother; Sleep apnea in his son; Thyroid disease in his daughter; Ulcers in his father. Allergies  Allergen Reactions  . Ace Inhibitors Other (See Comments)    cough  . Codeine Nausea Only and Rash       . Penicillins Rash    Childhood allergy Has patient had a PCN reaction causing immediate rash, facial/tongue/throat swelling, SOB or lightheadedness with hypotension: Yes Has patient had a PCN reaction causing severe rash involving mucus membranes or skin necrosis: Yes Has patient had a PCN reaction that required hospitalization No Has patient had a PCN reaction occurring within the last 10 years: No If all of the above answers are "NO", then may proceed with Cephalosporin  use.       Observations/Objective: Patient sounds cheerful and well on the phone. I do not appreciate any SOB. Speech and thought processing are grossly intact. Patient reported vitals:  Assessment and Plan: Cough and upper respiratory symptoms.  Suspect viral.  He is not any red flags such as persistent fever, dyspnea, etc.  Symptomatically improving already  [facility] of fluids and rest -Follow-up promptly for any recurrent fever or other concerns -Consider over-the-counter Mucinex as an expectorant  Follow Up Instructions:  -As above   99441 5-10 99442 11-20 99443 21-30 I did not refer this patient for an OV in the next 24 hours for this/these issue(s).  I discussed the assessment and treatment plan with the patient. The patient was provided an opportunity to ask questions and all were answered. The patient agreed with the plan and demonstrated an understanding of the instructions.   The patient was advised to call back or seek an in-person evaluation if the symptoms worsen or if the condition fails to improve as  anticipated.  I provided 17 minutes of non-face-to-face time during this encounter.   Carolann Littler, MD

## 2019-05-04 NOTE — Telephone Encounter (Signed)
Pt is going to get tested and has understanding

## 2019-05-04 NOTE — Telephone Encounter (Signed)
Pt's daughter tested positive for COVID today. She was around the pt this morning assisting him with his virtual visit with [name]. Pt would like to know what he should do?   He also stated that he has had both COVID inj     Pt can be reached at [phone]

## 2019-05-04 NOTE — Telephone Encounter (Signed)
Yes.  I think he should get tested to clarify.  Even though he has had the vaccine there is still some risk.  He should also stay quarantined until further clarified

## 2019-05-04 NOTE — Telephone Encounter (Signed)
Please advise 

## 2019-05-07 ENCOUNTER — Other Ambulatory Visit: Payer: Self-pay

## 2019-05-07 ENCOUNTER — Telehealth (INDEPENDENT_AMBULATORY_CARE_PROVIDER_SITE_OTHER): Payer: Medicare Other | Admitting: Family Medicine

## 2019-05-07 DIAGNOSIS — R059 Cough, unspecified: Secondary | ICD-10-CM

## 2019-05-07 DIAGNOSIS — U071 COVID-19: Secondary | ICD-10-CM

## 2019-05-07 DIAGNOSIS — R5383 Other fatigue: Secondary | ICD-10-CM

## 2019-05-07 DIAGNOSIS — R05 Cough: Secondary | ICD-10-CM

## 2019-05-07 NOTE — Progress Notes (Signed)
Patient ID: Gary Yates., male   DOB: 04-25-1933, 86 y.o.   MRN: [id number]   This visit type was conducted due to national recommendations for restrictions regarding the COVID-19 pandemic in an effort to limit this patient's exposure and mitigate transmission in our community.   Virtual Visit via Telephone Note  I connected with Gary Yates on 05/07/19 at  1:15 PM EDT by telephone and verified that I am speaking with the correct person using two identifiers.   I discussed the limitations, risks, security and privacy concerns of performing an evaluation and management service by telephone and the availability of in person appointments. I also discussed with the patient that there may be a patient responsible charge related to this service. The patient expressed understanding and agreed to proceed.  Location patient: home Location provider: work or home office Participants present for the call: patient, provider Patient did not have a visit in the prior 7 days to address this/these issue(s).   History of Present Illness: Gary Yates had virtual visit on the 27th.  He had some relatively mild URI symptoms.  This sounded viral.  He was actually feeling much better c/w onset last weekend .  Onset of symptoms was around last Saturday.  He then called back later on the 27th with information that his daughter had tested positive for Covid and his daughter had had previous Covid vaccine.  Both patient and his wife also had Covid vaccine.  Patient had Pfizer vaccine back in January and February.  After getting information that his daughter had tested positive we did recommend that he get tested as well.  He went on the 28th and got back results today reportedly positive.  He went to [facility].  He denies any fever.  He has some mild fatigue.  Mild body aches.  Occasional cough productive of thick mucus.  No fever.  No dyspnea.  No loss of taste or smell.  Past Medical History:  Diagnosis Date  . Anemia in  chronic renal disease 05/07/2017  . Anxiety   . Atrial fibrillation ([place])   . COPD (chronic obstructive pulmonary disease) ([place])    pt. denies  . Coronary artery disease    a. h/o Overlapping stents RCA;  b. 06/2011 Cath: patent stents, nonobs dzs, NL EF.  . Diabetic peripheral neuropathy ([place])   . Diffuse non-Hodgkin's lymphoma of testis ([place]) 09/28/2015  . DM (diabetes mellitus) ([place])    Type 2, peripheral neuropathy.  . Dyspnea    with exertion  . Dysrhythmia   . GERD (gastroesophageal reflux disease)   . Headache   . History of bronchitis   . History of kidney stones   . History of radiation therapy 02/19/16 - 03/13/16   Testis/Scrotum: 32.4 Gy in 18 fractions  . History of radiation therapy 08/07/16-08/20/16   left adrenal gland mass treated to 30 Gy in 10 fractions  . Hyperlipidemia   . Hypertension   . Iron deficiency anemia due to chronic blood loss 08/08/2017  . Low testosterone   . Nephrolithiasis   . OSA (obstructive sleep apnea) 11/26/2017  . Osteoarthritis    shoulder  . Restless leg   . SVT (supraventricular tachycardia) ([place])   . Urinary frequency   . Wears partial dentures    upper and lower   Past Surgical History:  Procedure Laterality Date  . APPENDECTOMY    . [place]  . CARDIAC CATHETERIZATION  01/2013  . CATARACT EXTRACTION, BILATERAL    .  CHOLECYSTECTOMY    . COLONOSCOPY    . CORONARY ANGIOPLASTY  2004  . CYSTOSCOPY N/A 08/18/2017   Procedure: CYSTOSCOPY WITH FULGURATION AND SUPRA PUBIC TUBE PLACEMENT;  Surgeon: Kathie Rhodes, MD;  [facility];  Service: Urology;  Laterality: N/A;  . EYE SURGERY Bilateral    cataracts  . IR GENERIC HISTORICAL  10/05/2015   IR US GUIDE VASC ACCESS RIGHT 10/05/2015 Marybelle Killings, MD [facility]  . IR GENERIC HISTORICAL  10/05/2015   IR FLUORO GUIDE PORT INSERTION RIGHT 10/05/2015 Marybelle Killings, MD [facility]  . LEFT HEART CATHETERIZATION WITH CORONARY ANGIOGRAM N/A 06/18/2011   Procedure: LEFT HEART  CATHETERIZATION WITH CORONARY ANGIOGRAM;  Surgeon: Peter M Martinique, MD;  Location: [facility] CATH LAB;  Service: Cardiovascular;  Laterality: N/A;  . LEFT HEART CATHETERIZATION WITH CORONARY ANGIOGRAM N/A 01/27/2013   Procedure: LEFT HEART CATHETERIZATION WITH CORONARY ANGIOGRAM;  Surgeon: Burnell Blanks, MD;  Location: [facility] CATH LAB;  Service: Cardiovascular;  Laterality: N/A;  . LUMBAR LAMINECTOMY/DECOMPRESSION MICRODISCECTOMY N/A 02/07/2015   Procedure: Lumbar three-Sacral one Decompression;  Surgeon: Kevan Ny Ditty, MD;  Location: [place] NEURO ORS;  Service: Neurosurgery;  Laterality: N/A;  L3 to S1 Decompression  . MULTIPLE TOOTH EXTRACTIONS    . ORCHIECTOMY Right 09/01/2015   Procedure: RIGHT ORCHIECTOMY;  Surgeon: Kathie Rhodes, MD;  Location[facility];  Service: Urology;  Laterality: Right;  . port a cath in place     . ROTATOR CUFF REPAIR Left     reports that he quit smoking about 41 years ago. His smoking use included cigarettes. He has a 30.00 pack-year smoking history. He has never used smokeless tobacco. He reports that he does not drink alcohol or use drugs. family history includes Alzheimer's disease in his mother; Arthritis in his brother and sister; Coronary artery disease in some other family members; Heart disease in his brother, father, mother, and sister; Migraines in his daughter and father; Obesity in his sister and son; Prostate cancer in his brother; Sleep apnea in his son; Thyroid disease in his daughter; Ulcers in his father. Allergies  Allergen Reactions  . Ace Inhibitors Other (See Comments)    cough  . Codeine Nausea Only and Rash       . Penicillins Rash    Childhood allergy Has patient had a PCN reaction causing immediate rash, facial/tongue/throat swelling, SOB or lightheadedness with hypotension: Yes Has patient had a PCN reaction causing severe rash involving mucus membranes or skin necrosis: Yes Has patient had a PCN reaction that required hospitalization  No Has patient had a PCN reaction occurring within the last 10 years: No If all of the above answers are "NO", then may proceed with Cephalosporin use.       Observations/Objective: Patient sounds cheerful and well on the phone. I do not appreciate any SOB. Speech and thought processing are grossly intact. Patient reported vitals:  Assessment and Plan:  Positive Covid diagnosis in a patient who has been immunized already for Covid.  He was exposed recently to his daughter who tested positive.  Fortunately, at this point his symptoms are relatively mild and improved compared to few days ago.  He does not have any way to monitor his oxygen levels -Recommend strict 10-day quarantine from onset of symptoms -Follow-up promptly for any shortness of breath or other concerns -Continue over-the-counter plain Mucinex as needed  Follow Up Instructions:  -As above   99441 5-10 99442 11-20 99443 21-30 I did not refer this patient for an  OV in the next 24 hours for this/these issue(s).  I discussed the assessment and treatment plan with the patient. The patient was provided an opportunity to ask questions and all were answered. The patient agreed with the plan and demonstrated an understanding of the instructions.   The patient was advised to call back or seek an in-person evaluation if the symptoms worsen or if the condition fails to improve as anticipated.  I provided 24 minutes of non-face-to-face time during this encounter.   Carolann Littler, MD

## 2019-05-10 ENCOUNTER — Other Ambulatory Visit: Payer: Self-pay

## 2019-05-10 ENCOUNTER — Telehealth (INDEPENDENT_AMBULATORY_CARE_PROVIDER_SITE_OTHER): Payer: Medicare Other | Admitting: Family Medicine

## 2019-05-10 ENCOUNTER — Telehealth: Payer: Self-pay | Admitting: Family Medicine

## 2019-05-10 ENCOUNTER — Encounter: Payer: Self-pay | Admitting: Family Medicine

## 2019-05-10 VITALS — Temp 98.6°F | Ht 70.0 in | Wt 234.0 lb

## 2019-05-10 DIAGNOSIS — U071 COVID-19: Secondary | ICD-10-CM | POA: Diagnosis not present

## 2019-05-10 DIAGNOSIS — R05 Cough: Secondary | ICD-10-CM

## 2019-05-10 DIAGNOSIS — R059 Cough, unspecified: Secondary | ICD-10-CM

## 2019-05-10 MED ORDER — DOXYCYCLINE HYCLATE 100 MG PO CAPS: 100.0000 mg | ORAL_CAPSULE | Freq: Two times a day (BID) | ORAL | 0 refills | Status: DC

## 2019-05-10 NOTE — Progress Notes (Signed)
Patient ID: Gary Yates., male   DOB: 11-08-1933, 86 y.o.   MRN: [id number]  This visit type was conducted due to national recommendations for restrictions regarding the COVID-19 pandemic in an effort to limit this patient's exposure and mitigate transmission in our community.   Virtual Visit via Telephone Note  I connected with Gary Yates on 05/10/19 at 10:15 AM EDT by telephone and verified that I am speaking with the correct person using two identifiers.   I discussed the limitations, risks, security and privacy concerns of performing an evaluation and management service by telephone and the availability of in person appointments. I also discussed with the patient that there may be a patient responsible charge related to this service. The patient expressed understanding and agreed to proceed.  Location patient: home Location provider: work or home office Participants present for the call: patient, provider Patient did not have a visit in the prior 7 days to address this/these issue(s).   History of Present Illness: Gary Yates has Covid diagnosis.  This following previous immunization.  He had called back today stating that he is having cough productive of thick yellow mucus.  No obvious wheezing.  No fever.  Last week he states he was actually feeling better.  He does not have any dyspnea at rest and not really with activity.  He does have diagnosis of COPD.  No hemoptysis.  No loss of taste or smell.  No vomiting.  No diarrhea.   Observations/Objective: Patient sounds cheerful and well on the phone. I do not appreciate any SOB. Speech and thought processing are grossly intact. Patient reported vitals:  Assessment and Plan:  Recent COVID-19 diagnosis.  Patient has productive cough.  He has underlying COPD.  He is not monitoring pulse oximetry.  He is in no respiratory distress  -We recommended consideration for doxycycline 100 mg twice daily for 7 days because of his COPD  diagnosis  -Plenty of fluids and rest  -Follow-up immediately for increased dyspnea or other concerns  Follow Up Instructions:  -Order placed for my chart COVID-19 monitoring program   99441 5-10 99442 11-20 99443 21-30 I did not refer this patient for an OV in the next 24 hours for this/these issue(s).  I discussed the assessment and treatment plan with the patient. The patient was provided an opportunity to ask questions and all were answered. The patient agreed with the plan and demonstrated an understanding of the instructions.   The patient was advised to call back or seek an in-person evaluation if the symptoms worsen or if the condition fails to improve as anticipated.  I provided 16 minutes of non-face-to-face time during this encounter.   Carolann Littler, MD

## 2019-05-10 NOTE — Telephone Encounter (Signed)
Pt said that he has tested positive and would like a call back to go over what he can do for his symptoms.

## 2019-05-10 NOTE — Telephone Encounter (Signed)
Noted  

## 2019-05-10 NOTE — Telephone Encounter (Signed)
Hinton Dyer from Access Nurse (Nurse Triage) stated the pt has tested pos for COVID. He is not in any acute distress and denies SOB. Testing site stated to the pt to notified/be seen by PCP. He is having cough and body aches but the pt is unable to take his temperature. Hinton Dyer wanted to inform the PCP so he could reach out.   Pt can be reached at [phone]

## 2019-05-10 NOTE — Telephone Encounter (Signed)
Pt has been scheduled.  °

## 2019-05-12 MED ORDER — PREDNISONE 20 MG PO TABS: 20.0000 mg | ORAL_TABLET | Freq: Two times a day (BID) | ORAL | 0 refills | Status: DC

## 2019-05-12 NOTE — Telephone Encounter (Signed)
Fyi/ please advise

## 2019-05-12 NOTE — Telephone Encounter (Signed)
Pt was transferred from the triage nurse to the office, pt wanted to inform Dr. Elease Yates that he experiences wheezing when coughing and his coughing can be intense. He does not feel like he needs to go to ER or anything.

## 2019-05-12 NOTE — Telephone Encounter (Signed)
Pt has been informed and predinsone sent in

## 2019-05-12 NOTE — Telephone Encounter (Signed)
Let's start prednisone 20 mg 1 tablet twice daily for 5 days.  We will need to monitor blood sugars closely as they may bump up during treatment.

## 2019-05-14 ENCOUNTER — Telehealth: Payer: Self-pay | Admitting: Family Medicine

## 2019-05-14 NOTE — Telephone Encounter (Signed)
I would base the duration of quarantine on duration of his symptoms with current Covid infection (which would be 10 days from onset of symptoms) and not base on duration of antibiotics

## 2019-05-14 NOTE — Telephone Encounter (Signed)
INR 3.7  Patient has COVID  doxycycline (VIBRAMYCIN) 100 MG capsule  3 days left  predniSONE (DELTASONE) 20 MG tablet  4 days left   Kathlee Nations [facility] [phone]

## 2019-05-14 NOTE — Telephone Encounter (Signed)
Gary Yates given instructions as indicated and will check inr on Tuesday

## 2019-05-14 NOTE — Telephone Encounter (Signed)
HOLD Coumadin Saturday and take 2.5 mg Sunday and then resume regular dosing.  Recheck INR next Tues or Wednesday.

## 2019-05-14 NOTE — Telephone Encounter (Signed)
Pt called to give an update on his symptoms since testing positive. He still has some mucus and cough but feels better than before. He has three days left of the antibiotic and 4 days of the predisone. Pt also stated that a nurse is coming by today to check his coumadin due to the medication PCP has him on.  Pt wants to know if he needs to stay in quarantine until his medication is finished because his last day of quarantine is tomorrow?   Pt can be reached at [phone]

## 2019-05-14 NOTE — Telephone Encounter (Signed)
Fyi inr 3.7

## 2019-05-14 NOTE — Telephone Encounter (Signed)
Pt has been informed.

## 2019-05-14 NOTE — Telephone Encounter (Signed)
Please advise 

## 2019-05-14 NOTE — Telephone Encounter (Signed)
Kathlee Nations believes the antibiotic is making his INR go up and needs a call back today fro PCP/CMA in regards to what the pt needs to do.   Kathlee Nations can be reached at [phone]

## 2019-05-17 ENCOUNTER — Telehealth: Payer: Self-pay | Admitting: Family Medicine

## 2019-05-17 NOTE — Telephone Encounter (Signed)
Pt is calling with an update. Pt's last day to take Prednisone is today and has a slight clear cough off/on. Thanks

## 2019-05-17 NOTE — Telephone Encounter (Signed)
fyi

## 2019-05-18 ENCOUNTER — Ambulatory Visit (INDEPENDENT_AMBULATORY_CARE_PROVIDER_SITE_OTHER): Payer: Medicare Other | Admitting: General Practice

## 2019-05-18 DIAGNOSIS — I480 Paroxysmal atrial fibrillation: Secondary | ICD-10-CM

## 2019-05-18 DIAGNOSIS — Z7901 Long term (current) use of anticoagulants: Secondary | ICD-10-CM | POA: Diagnosis not present

## 2019-05-18 LAB — POCT INR: INR: 2.5 (ref 2.0–3.0)

## 2019-05-18 NOTE — Patient Instructions (Signed)
Pre visit review using our clinic review tool, if applicable. No additional management support is needed unless otherwise documented below in the visit note.  Continue to take  5 mg daily except 2.5 mg on Mon, Wed and Friday and Saturdays.  Re-check in 1 weeks.  Dosing instructions and VO given toTracy, RN @ [facility] while in patient's home.  [phone].

## 2019-05-18 NOTE — Progress Notes (Signed)
Medical screening examination/treatment/procedure(s) were performed by non-physician practitioner and as supervising physician I was immediately available for consultation/collaboration. I agree with above. Ramiro Pangilinan, MD   

## 2019-05-24 ENCOUNTER — Other Ambulatory Visit: Payer: Self-pay

## 2019-05-24 ENCOUNTER — Telehealth: Payer: Self-pay | Admitting: Family Medicine

## 2019-05-24 ENCOUNTER — Telehealth (INDEPENDENT_AMBULATORY_CARE_PROVIDER_SITE_OTHER): Payer: Medicare Other | Admitting: Family Medicine

## 2019-05-24 ENCOUNTER — Encounter: Payer: Self-pay | Admitting: Family Medicine

## 2019-05-24 DIAGNOSIS — U071 COVID-19: Secondary | ICD-10-CM

## 2019-05-24 MED ORDER — LEVOFLOXACIN 500 MG PO TABS: 500.0000 mg | ORAL_TABLET | Freq: Every day | ORAL | 0 refills | Status: AC

## 2019-05-24 MED ORDER — ALBUTEROL SULFATE HFA 108 (90 BASE) MCG/ACT IN AERS
2.0000 | INHALATION_SPRAY | RESPIRATORY_TRACT | 1 refills | Status: DC | PRN
Start: 2019-05-24 — End: 2019-08-23

## 2019-05-24 MED ORDER — METHYLPREDNISOLONE 4 MG PO TBPK: ORAL_TABLET | ORAL | 0 refills | Status: DC

## 2019-05-24 NOTE — Progress Notes (Signed)
   Subjective:    Patient ID: Gary Yates., male    DOB: 1933/12/11, 86 y.o.   MRN: [id number]  HPI Virtual Visit via Telephone Note  I connected with the patient on 05/24/19 at  9:45 AM EDT by telephone and verified that I am speaking with the correct person using two identifiers.   I discussed the limitations, risks, security and privacy concerns of performing an evaluation and management service by telephone and the availability of in person appointments. I also discussed with the patient that there may be a patient responsible charge related to this service. The patient expressed understanding and agreed to proceed.  Location patient: home Location provider: work or home office Participants present for the call: patient, provider Patient did not have a visit in the prior 7 days to address this/these issue(s).   History of Present Illness: Here for continued respiratory symptoms after being diagnosed with Covid related bronchitis by a virtual visit on 05-10-19. He was treated with 7 days of Doxycycline and a few days of Prednisone. He felt better for a few days but now he again has chest congestion, coughing up yellow sputum, and mild SOB. No chest pain or fever.    Observations/Objective: Patient sounds cheerful and well on the phone. I do not appreciate any SOB. Speech and thought processing are grossly intact. Patient reported vitals:  Assessment and Plan: Covid bronchitis, treat with 10 days of Levaquin, a Medrol dose pack, and an albuterol inhaler. Recheck prn.  Alysia Penna, MD    Follow Up Instructions:     [phone] 5-10 [phone] 11-20 [phone] 21-30 I did not refer this patient for an OV in the next 24 hours for this/these issue(s).  I discussed the assessment and treatment plan with the patient. The patient was provided an opportunity to ask questions and all were answered. The patient agreed with the plan and demonstrated an understanding of the instructions.   The  patient was advised to call back or seek an in-person evaluation if the symptoms worsen or if the condition fails to improve as anticipated.  I provided 14 minutes of non-face-to-face time during this encounter.   Alysia Penna, MD    Review of Systems     Objective:   Physical Exam        Assessment & Plan:

## 2019-05-24 NOTE — Telephone Encounter (Signed)
error 

## 2019-05-24 NOTE — Telephone Encounter (Signed)
Pt daughter call and want a call back about his visit today.

## 2019-05-24 NOTE — Telephone Encounter (Signed)
Please see what questions she has when she calls back. Left message for patient to call back.

## 2019-05-26 ENCOUNTER — Ambulatory Visit (INDEPENDENT_AMBULATORY_CARE_PROVIDER_SITE_OTHER): Payer: Medicare Other | Admitting: General Practice

## 2019-05-26 DIAGNOSIS — Z7901 Long term (current) use of anticoagulants: Secondary | ICD-10-CM

## 2019-05-26 DIAGNOSIS — I480 Paroxysmal atrial fibrillation: Secondary | ICD-10-CM

## 2019-05-26 LAB — POCT INR: INR: 2.4 (ref 2.0–3.0)

## 2019-05-26 NOTE — Patient Instructions (Signed)
Pre visit review using our clinic review tool, if applicable. No additional management support is needed unless otherwise documented below in the visit note.  Continue to take  5 mg daily except 2.5 mg on Mon, Wed and Friday and Saturdays.  Re-check in 1 weeks.  Dosing instructions and VO given to [place], RN @ [facility] while in patient's home.  [phone].

## 2019-05-26 NOTE — Progress Notes (Signed)
I have reviewed and agree with note, evaluation, plan.   Stephen Hunter, MD  

## 2019-06-01 ENCOUNTER — Telehealth: Payer: Self-pay | Admitting: Family Medicine

## 2019-06-01 NOTE — Telephone Encounter (Signed)
Please advise if okay?

## 2019-06-01 NOTE — Telephone Encounter (Signed)
Left detailed message giving verbal orders. 

## 2019-06-01 NOTE — Telephone Encounter (Signed)
ok 

## 2019-06-01 NOTE — Telephone Encounter (Signed)
Kathlee Nations with [facility], would is requesting a verbal order for a pt evaluation.  Liz's [phone]

## 2019-06-06 ENCOUNTER — Other Ambulatory Visit: Payer: Self-pay | Admitting: Family Medicine

## 2019-06-08 ENCOUNTER — Ambulatory Visit (INDEPENDENT_AMBULATORY_CARE_PROVIDER_SITE_OTHER): Payer: Medicare Other | Admitting: General Practice

## 2019-06-08 ENCOUNTER — Telehealth: Payer: Self-pay | Admitting: Family Medicine

## 2019-06-08 DIAGNOSIS — Z7901 Long term (current) use of anticoagulants: Secondary | ICD-10-CM

## 2019-06-08 LAB — POCT INR: INR: 4.4 — AB (ref 2.0–3.0)

## 2019-06-08 NOTE — Patient Instructions (Signed)
Pre visit review using our clinic review tool, if applicable. No additional management support is needed unless otherwise documented below in the visit note.  Hold dosage today and tomorrow and then continue to take  5 mg daily except 2.5 mg on Mon, Wed and Friday and Saturdays.  Re-check in 1 weeks.  Dosing instructions and VO given to [place], [place] @ [facility] while in patient's home.  [phone].

## 2019-06-08 NOTE — Telephone Encounter (Signed)
FYI Pt had a fall this past Sunday 06/06/2019 from a pair of 20+years shoes that he tried on to see if they stilled fit.  No injuries

## 2019-06-08 NOTE — Telephone Encounter (Signed)
noted 

## 2019-06-14 ENCOUNTER — Inpatient Hospital Stay: Payer: Medicare Other

## 2019-06-15 ENCOUNTER — Ambulatory Visit (INDEPENDENT_AMBULATORY_CARE_PROVIDER_SITE_OTHER): Payer: Medicare Other | Admitting: General Practice

## 2019-06-15 DIAGNOSIS — I480 Paroxysmal atrial fibrillation: Secondary | ICD-10-CM

## 2019-06-15 DIAGNOSIS — Z7901 Long term (current) use of anticoagulants: Secondary | ICD-10-CM

## 2019-06-15 LAB — POCT INR: INR: 2.2 (ref 2.0–3.0)

## 2019-06-15 NOTE — Patient Instructions (Signed)
Pre visit review using our clinic review tool, if applicable. No additional management support is needed unless otherwise documented below in the visit note.  Continue to take  5 mg daily except 2.5 mg on Mon, Wed and Friday and Saturdays.  Re-check in 2 weeks.  Dosing instructions and VO given to [place], [place] @ [facility] while in patient's home.  [phone].

## 2019-06-15 NOTE — Progress Notes (Signed)
Medical screening examination/treatment/procedure(s) were performed by non-physician practitioner and as supervising physician I was immediately available for consultation/collaboration. I agree with above. Ersilia Brawley, MD   

## 2019-06-17 ENCOUNTER — Encounter (HOSPITAL_COMMUNITY): Payer: Self-pay

## 2019-06-17 ENCOUNTER — Ambulatory Visit (HOSPITAL_COMMUNITY)
Admission: EM | Admit: 2019-06-17 | Discharge: 2019-06-17 | Disposition: A | Discharge status: Home / Self Care | Payer: Medicare Other

## 2019-06-17 ENCOUNTER — Other Ambulatory Visit: Payer: Self-pay

## 2019-06-17 DIAGNOSIS — M722 Plantar fascial fibromatosis: Secondary | ICD-10-CM

## 2019-06-17 DIAGNOSIS — M79671 Pain in right foot: Secondary | ICD-10-CM

## 2019-06-17 NOTE — Discharge Instructions (Signed)
Continue the tylenol  Ice the bottom of the foot, consider the bottle method we discussed  Rest the foot and elevate  Schedule follow up with PCP for consideration of Specialist referal  Try the stretches provided

## 2019-06-17 NOTE — ED Provider Notes (Signed)
[place]    CSN: [id number] Arrival date & time: 06/17/19  1818      History   Chief Complaint Chief Complaint  Patient presents with  . Foot Pain    HPI Gary Yates. is a 86 y.o. male.   Patient presents for 1 week or slightly greater right heel pain.  He reports the bottom of his right heel has been hurting her last week or so.  Is been getting worse.  He reports it hurts worse as the day goes on.  He reports he will start to notice the pain around 11 AM.  He reports it is not very bothersome when he first wakes up.  He reports the pain stays in the bottom of the heel.  He has not had any swelling or trauma to the heel.  He called his primary's office and they recommended he come be seen today.  He has tried Tylenol and heat packs.  This helped a little bit.     Past Medical History:  Diagnosis Date  . Anemia in chronic renal disease 05/07/2017  . Anxiety   . Atrial fibrillation ([place])   . COPD (chronic obstructive pulmonary disease) ([place])    pt. denies  . Coronary artery disease    a. h/o Overlapping stents RCA;  b. 06/2011 Cath: patent stents, nonobs dzs, NL EF.  . Diabetic peripheral neuropathy ([name])   . Diffuse non-Hodgkin's lymphoma of testis ([place]) 09/28/2015  . DM (diabetes mellitus) ([place])    Type 2, peripheral neuropathy.  . Dyspnea    with exertion  . Dysrhythmia   . GERD (gastroesophageal reflux disease)   . Headache   . History of bronchitis   . History of kidney stones   . History of radiation therapy 02/19/16 - 03/13/16   Testis/Scrotum: 32.4 Gy in 18 fractions  . History of radiation therapy 08/07/16-08/20/16   left adrenal gland mass treated to 30 Gy in 10 fractions  . Hyperlipidemia   . Hypertension   . Iron deficiency anemia due to chronic blood loss 08/08/2017  . Low testosterone   . Nephrolithiasis   . OSA (obstructive sleep apnea) 11/26/2017  . Osteoarthritis    shoulder  . Restless leg   . SVT (supraventricular tachycardia) ([place])     . Urinary frequency   . Wears partial dentures    upper and lower    Patient Active Problem List   Diagnosis Date Noted  . OSA (obstructive sleep apnea) 11/26/2017  . Iron deficiency anemia due to chronic blood loss 08/08/2017  . Sepsis secondary to UTI ([place]) 06/17/2017  . Thrombocytopenia ([place]) 06/17/2017  . Acute urinary retention 06/17/2017  . Sepsis ([place]) 06/06/2017  . Anemia in chronic renal disease 05/07/2017  . Long term (current) use of anticoagulants 12/10/2016  . V-tach ([place]) 07/02/2016  . Protein-calorie malnutrition, severe 07/02/2016  . Bilateral lower extremity edema 07/01/2016  . Bilateral leg edema 06/11/2016  . 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 Lumbosacral spondylosis with radiculopathy 02/07/2015  . At high risk for falls 05/05/2014  . Encounter for therapeutic drug monitoring 03/15/2013  . Chest pain 02/03/2013  . Obesity (BMI 30-39.9) 09/21/2012  . Type 2 diabetes mellitus with diabetic neuropathy, unspecified ([place]) 07/03/2012  . Chronic kidney disease, stage III (moderate) 07/03/2012  . Restless leg syndrome 12/04/2011  . Long term current use of anticoagulant therapy 07/15/2011  . Paroxysmal atrial fibrillation ([place]) 06/21/2011  . Upper extremity weakness 06/21/2011  .  Midsternal chest pain 06/19/2011  . Coronary artery disease   . Hypertension   . Hyperlipidemia   . COPD (chronic obstructive pulmonary disease) ([place])   . GERD (gastroesophageal reflux disease)   . Peripheral neuropathy   . Dyslipidemia 03/27/2011  . Diabetic peripheral neuropathy ([place]) 12/17/2010  . Hypotestosteronism 05/07/2010  . LEG CRAMPS, IDIOPATHIC 08/14/2009  . Insomnia 08/14/2009  . Malignant neoplasm of skin of face 08/14/2009  . OBESITY 08/02/2009  . HYPERTENSION, HEART CONTROLLED W/O ASSOC CHF 03/28/2008  . CAD, NATIVE VESSEL 03/28/2008  . G E R D 01/12/2007  . Morbid obesity ([place]) 09/15/2006  . Hyperlipidemia, mixed 07/01/2006  . Essential  hypertension 07/01/2006  . Coronary atherosclerosis 07/01/2006  . COPD 07/01/2006  . OSTEOARTHRITIS 07/01/2006    Past Surgical History:  Procedure Laterality Date  . APPENDECTOMY    . [place]  . CARDIAC CATHETERIZATION  01/2013  . CATARACT EXTRACTION, BILATERAL    . CHOLECYSTECTOMY    . COLONOSCOPY    . CORONARY ANGIOPLASTY  2004  . CYSTOSCOPY N/A 08/18/2017   Procedure: CYSTOSCOPY WITH FULGURATION AND SUPRA PUBIC TUBE PLACEMENT;  Surgeon: Kathie Rhodes, MD;  Location: [facility] ORS;  Service: Urology;  Laterality: N/A;  . EYE SURGERY Bilateral    cataracts  . IR GENERIC HISTORICAL  10/05/2015   IR US GUIDE VASC ACCESS RIGHT 10/05/2015 Marybelle Killings, MD [facility]-INTER[facility] RAD  . IR GENERIC HISTORICAL  10/05/2015   IR FLUORO GUIDE PORT INSERTION RIGHT 10/05/2015 Marybelle Killings, MD [facility]  . LEFT HEART CATHETERIZATION WITH CORONARY ANGIOGRAM N/A 06/18/2011   Procedure: LEFT HEART CATHETERIZATION WITH CORONARY ANGIOGRAM;  Surgeon: Peter M Martinique, MD;  Location: [facility] CATH LAB;  Service: Cardiovascular;  Laterality: N/A;  . LEFT HEART CATHETERIZATION WITH CORONARY ANGIOGRAM N/A 01/27/2013   Procedure: LEFT HEART CATHETERIZATION WITH CORONARY ANGIOGRAM;  Surgeon: Burnell Blanks, MD;  Location: [facility] CATH LAB;  Service: Cardiovascular;  Laterality: N/A;  . LUMBAR LAMINECTOMY/DECOMPRESSION MICRODISCECTOMY N/A 02/07/2015   Procedure: Lumbar three-Sacral one Decompression;  Surgeon: Kevan Ny Ditty, MD;  Location: [facility] NEURO ORS;  Service: Neurosurgery;  Laterality: N/A;  L3 to S1 Decompression  . MULTIPLE TOOTH EXTRACTIONS    . ORCHIECTOMY Right 09/01/2015   Procedure: RIGHT ORCHIECTOMY;  Surgeon: Kathie Rhodes, MD;  Location: [facility];  Service: Urology;  Laterality: Right;  . port a cath in place     . ROTATOR CUFF REPAIR Left        Home Medications    Prior to Admission medications   Medication Sig Start Date End Date Taking? Authorizing Provider  acetaminophen (TYLENOL) 500 MG  tablet Take 1,000 mg by mouth every 6 (six) hours as needed (for pain/fever/headaches.).     [provider]  albuterol (VENTOLIN HFA) 108 (90 Base) MCG/ACT inhaler Inhale 2 puffs into the lungs every 4 (four) hours as needed for wheezing or shortness of breath. 05/24/19   Laurey Morale, MD  atorvastatin (LIPITOR) 40 MG tablet TAKE 1 TABLET BY MOUTH EVERY DAY 06/08/19   [name], Alinda Sierras, MD  docusate sodium (COLACE) 50 MG capsule Take 1 capsule (50 mg total) by mouth 2 (two) times daily as needed (for constipation). 04/22/16   [name], Alinda Sierras, MD  furosemide (LASIX) 40 MG tablet Take 1 tablet (40 mg total) by mouth every other day. Take one tablet by mouth every other day. 08/10/18   [name], Alinda Sierras, MD  gabapentin (NEURONTIN) 300 MG capsule TAKE 2 CAPSULES BY MOUTH  3 TIMES A DAY Patient taking differently: 900 mg 2 (two) times daily.  03/26/19   [name], Alinda Sierras, MD  glucose blood (ACCU-CHEK AVIVA PLUS) test strip 1 each by Other route 3 (three) times daily. for testing 08/25/18   [name], MD  KLOR-CON M20 20 MEQ tablet TAKE 1 TABLET BY MOUTH EVERY DAY 04/03/19   [name], Alinda Sierras, MD  Melatonin 1 MG TABS Take 1 tablet (1 mg total) by mouth at bedtime. Patient taking differently: Take 10 mg by mouth at bedtime.  08/14/18   [name], Alinda Sierras, MD  metFORMIN (GLUCOPHAGE) 500 MG tablet TAKE 1 TABLET BY MOUTH TWICE A DAY WITH MEALS 03/26/19   [name], Alinda Sierras, MD  methylPREDNISolone (MEDROL DOSEPAK) 4 MG TBPK tablet As directed 05/24/19   Laurey Morale, MD  metoprolol succinate (TOPROL-XL) 25 MG 24 hr tablet Take 1 tablet (25 mg total) by mouth daily. 11/24/18   Burnell Blanks, MD  nitroGLYCERIN (NITROSTAT) 0.4 MG SL tablet Place 1 tablet (0.4 mg total) under the tongue every 5 (five) minutes as needed. Chest pain 01/20/14   Burnell Blanks, MD  nystatin (MYCOSTATIN/NYSTOP) powder APPLY TO AFFECTED AREA 4 TIMES A DAY 04/03/19   [name], Alinda Sierras, MD    nystatin-triamcinolone ointment (MYCOLOG) Apply 1 application topically 2 (two) times daily. 12/26/17   [name], Alinda Sierras, MD  pantoprazole (PROTONIX) 40 MG tablet TAKE 1 TABLET BY MOUTH EVERY DAY 11/30/18   [name], Alinda Sierras, MD  polyethylene glycol (MIRALAX / GLYCOLAX) packet Take 17 g by mouth daily. 06/20/17   Patrecia Pour, Christean Grief, MD  pramipexole (MIRAPEX) 1.5 MG tablet TAKE 2 TABLETS BY MOUTH EVERY EVENING 04/19/19   [name], Alinda Sierras, MD  warfarin (COUMADIN) 5 MG tablet Take 1 tablet daily except take 1 1/2 tablets on Sundays only or Take as directed by anticoagulation clinic. Patient taking differently: 04/14/2019 Takes 5 mg daily except M,W,F takes 2.5mg . 08/11/18   [name], Alinda Sierras, MD  metoprolol (LOPRESSOR) 50 MG tablet  06/08/10 03/27/11  [provider]  spironolactone (ALDACTONE) 25 MG tablet  01/14/11 03/27/11  [provider]    Family History Family History  Problem Relation Age of Onset  . Alzheimer's disease Mother   . Heart disease Mother   . Heart disease Father   . Migraines Father   . Ulcers Father   . Prostate cancer Brother   . Coronary artery disease Other        Male 1st degree relative <50  . Coronary artery disease Other        male 1st degree relative <60  . Heart disease Sister   . Obesity Sister        Morbid  . Arthritis Sister   . Heart disease Brother   . Arthritis Brother   . Sleep apnea Son   . Obesity Son   . Migraines Daughter   . Thyroid disease Daughter     Social History Social History   Tobacco Use  . Smoking status: Former Smoker    Packs/day: 1.50    Years: 20.00    Pack years: 30.00    Types: Cigarettes    Quit date: 04/05/1978    Years since quitting: 41.2  . Smokeless tobacco: Never Used  Vaping Use  . Vaping Use: Never used  Substance Use Topics  . Alcohol use: No  . Drug use: No     Allergies   Ace inhibitors, Codeine, and Penicillins   Review  of Systems Review of Systems   Physical  Exam Triage Vital Signs ED Triage Vitals [06/17/19 1918]  Enc Vitals Group     BP 125/65     Pulse Rate 84     Resp 17     Temp 97.9 F (36.6 C)     Temp Source Oral     SpO2 96 %     Weight      Height      Head Circumference      Peak Flow      Pain Score 7     Pain Loc      Pain Edu?      Excl. in [place]?    No data found.  Updated Vital Signs BP 125/65 (BP Location: Right Arm)   Pulse 84   Temp 97.9 F (36.6 C) (Oral)   Resp 17   SpO2 96%   Visual Acuity Right Eye Distance:   Left Eye Distance:   Bilateral Distance:    Right Eye Near:   Left Eye Near:    Bilateral Near:     Physical Exam Vitals and nursing note reviewed.  Constitutional:      Appearance: He is well-developed.  HENT:     Head: Normocephalic and atraumatic.  Cardiovascular:     Rate and Rhythm: Normal rate.  Pulmonary:     Effort: Pulmonary effort is normal. No respiratory distress.  Musculoskeletal:     Cervical back: Neck supple.     Comments: Right heel without swelling, erythema or ecchymosis.  There is no posterior heel tenderness.  No Achilles tenderness.  There is tenderness to palpation of the plantar surface of the heel.  Minimal to no tenderness over the remaining plantar fascia.  No dorsal foot tenderness.  No ankle tenderness.  Patient is amatory without much issue with aid of walker.  Skin:    General: Skin is warm and dry.  Neurological:     General: No focal deficit present.     Mental Status: He is alert and oriented to person, place, and time.      UC Treatments / Results  Labs (all labs ordered are listed, but only abnormal results are displayed) Labs Reviewed - No data to display  EKG   Radiology No results found.  Procedures Procedures (including critical care time)  Medications Ordered in UC Medications - No data to display  Initial Impression / Assessment and Plan / UC Course  I have reviewed the triage vital signs and the nursing notes.  Pertinent  labs & imaging results that were available during my care of the patient were reviewed by me and considered in my medical decision making (see chart for details).     #Heel pain #Plantar fasciitis Patient is an 86 year old with numerous comorbidities presenting with heel pain suspicious for early plantar fasciitis.  Also on the differential would be heel spur.  Given patient is on anticoagulation with known history of cardiac issues avoid NSAIDs.  Recommend continue Tylenol, use of cryotherapy and massage as well as rest.  Instructed patient to follow-up with primary care early next week for reevaluation and possible referral for specialty care.  Basic plantar fasciitis stretching information given.  Patient verbalized understanding Final Clinical Impressions(s) / UC Diagnoses   Final diagnoses:  Pain of right heel  Plantar fasciitis of right foot     Discharge Instructions     Continue the tylenol  Ice the bottom of the foot, consider the bottle method  we discussed  Rest the foot and elevate  Schedule follow up with PCP for consideration of Specialist referal  Try the stretches provided      ED Prescriptions    None     PDMP not reviewed this encounter.   Purnell Shoemaker, PA-C 06/17/19 2356

## 2019-06-17 NOTE — ED Triage Notes (Signed)
Pt presents with right foot pain X 1 week not injury related.

## 2019-06-18 ENCOUNTER — Telehealth: Payer: Self-pay | Admitting: Family Medicine

## 2019-06-18 NOTE — Telephone Encounter (Signed)
Pt went to Urgent Care yesterday 6/10 due to pain in his R. Heel for a week. Pt stated it felt like a tack was in his shoe stabbing his heel.  Urgent care told him to continue taking tylenol, rest his foot, and ice the bottom. They informed him to update his PCP.   Pt was informed if symptoms do not subside contact office for an appointment.   Pt can be reached at [phone]

## 2019-06-18 NOTE — Telephone Encounter (Signed)
Noted  

## 2019-06-22 ENCOUNTER — Telehealth: Payer: Self-pay | Admitting: Family Medicine

## 2019-06-22 NOTE — Telephone Encounter (Signed)
Please advise 

## 2019-06-22 NOTE — Telephone Encounter (Signed)
Sent from IT please advise  hi Ashtin Rosner, I am looking at this pt's chart. ITS cannot remove diagnosis from a patient's chart. If Dr. Elease Hashimoto see that this diagnosis is incorrect, Dr. Elease Hashimoto can resolve or delete it. I was able to log in as Dr. B in our test environment, right click on the diagnosis and a list of options is listed. Hope this helps Thanks, Bonnita Nasuti

## 2019-06-22 NOTE — Telephone Encounter (Signed)
That dx was apparently put in by someone back in 2008.  I am not sure why they entered it.  Technically, it should not be listed as a problem unless confirmed by pulmonary function testing.  Only someone with IT can remove it.  I can remove it from active problem list though.

## 2019-06-22 NOTE — Telephone Encounter (Signed)
Pt informed that we are sending this to IT to get fixed

## 2019-06-22 NOTE — Telephone Encounter (Signed)
IT ticket has been put in #[id number]

## 2019-06-22 NOTE — Telephone Encounter (Signed)
Pt was looking at his medical record and saw that is stated he has COPD. Per pt, does not have COPD and would like it removed from his medical record. Pt would like to speak to someone regarding this. Thanks

## 2019-06-22 NOTE — Telephone Encounter (Signed)
I have deleted COPD from problem list.

## 2019-06-23 NOTE — Telephone Encounter (Signed)
Pt informed that diagnosis has been deleted

## 2019-06-28 ENCOUNTER — Ambulatory Visit (INDEPENDENT_AMBULATORY_CARE_PROVIDER_SITE_OTHER): Payer: Medicare Other | Admitting: General Practice

## 2019-06-28 DIAGNOSIS — I480 Paroxysmal atrial fibrillation: Secondary | ICD-10-CM | POA: Diagnosis not present

## 2019-06-28 DIAGNOSIS — Z7901 Long term (current) use of anticoagulants: Secondary | ICD-10-CM

## 2019-06-28 LAB — POCT INR: INR: 1.8 — AB (ref 2.0–3.0)

## 2019-06-28 NOTE — Patient Instructions (Signed)
Pre visit review using our clinic review tool, if applicable. No additional management support is needed unless otherwise documented below in the visit note.  Take 1 tablet (5 mg) today and then continue to take  5 mg daily except 2.5 mg on Mon, Wed and Friday and Saturdays.  Re-check in 1 week.  Dosing instructions and VO given to [place], RN @ [facility] while in patient's home.  [phone]

## 2019-06-29 ENCOUNTER — Inpatient Hospital Stay: Payer: Medicare Other | Attending: Hematology & Oncology

## 2019-06-29 ENCOUNTER — Other Ambulatory Visit: Payer: Self-pay

## 2019-06-29 VITALS — BP 137/76 | HR 87 | Temp 97.7°F | Resp 16

## 2019-06-29 DIAGNOSIS — C8599 Non-Hodgkin lymphoma, unspecified, extranodal and solid organ sites: Secondary | ICD-10-CM | POA: Diagnosis not present

## 2019-06-29 DIAGNOSIS — N184 Chronic kidney disease, stage 4 (severe): Secondary | ICD-10-CM

## 2019-06-29 DIAGNOSIS — Z452 Encounter for adjustment and management of vascular access device: Secondary | ICD-10-CM | POA: Insufficient documentation

## 2019-06-29 DIAGNOSIS — D631 Anemia in chronic kidney disease: Secondary | ICD-10-CM

## 2019-06-29 DIAGNOSIS — D500 Iron deficiency anemia secondary to blood loss (chronic): Secondary | ICD-10-CM

## 2019-06-29 MED ORDER — SODIUM CHLORIDE 0.9% FLUSH
10.0000 mL | Freq: Once | INTRAVENOUS | Status: AC | PRN
  Administered 2019-06-29: 10 mL
  Filled 2019-06-29: qty 10

## 2019-06-29 MED ORDER — HEPARIN SOD (PORK) LOCK FLUSH 100 UNIT/ML IV SOLN
500.0000 [IU] | Freq: Once | INTRAVENOUS | Status: AC | PRN
  Administered 2019-06-29: 500 [IU]
  Filled 2019-06-29: qty 5

## 2019-06-29 MED ORDER — LIDOCAINE-PRILOCAINE 2.5-2.5 % EX CREA: 1.0000 "application " | TOPICAL_CREAM | CUTANEOUS | 12 refills | Status: DC | PRN

## 2019-07-06 ENCOUNTER — Ambulatory Visit (INDEPENDENT_AMBULATORY_CARE_PROVIDER_SITE_OTHER): Payer: Medicare Other | Admitting: General Practice

## 2019-07-06 DIAGNOSIS — I480 Paroxysmal atrial fibrillation: Secondary | ICD-10-CM | POA: Diagnosis not present

## 2019-07-06 DIAGNOSIS — Z7901 Long term (current) use of anticoagulants: Secondary | ICD-10-CM

## 2019-07-06 LAB — POCT INR: INR: 1.6 — AB (ref 2.0–3.0)

## 2019-07-06 NOTE — Progress Notes (Signed)
Patient ID: Gary Kotch., male   DOB: November 03, 1933, 86 y.o.   MRN: [id number]  Medical screening examination/treatment/procedure(s) were performed by non-physician practitioner and as supervising physician I was immediately available for consultation/collaboration. I agree with above. Cathlean Cower, MD

## 2019-07-06 NOTE — Patient Instructions (Signed)
Pre visit review using our clinic review tool, if applicable. No additional management support is needed unless otherwise documented below in the visit note.  Take 1 1/2 tablets today and 1 tablet tomorrow and the change dosage and take  5 mg daily except 2.5 mg on Mon, Wed and Fridays.   Re-check in 2 weeks.  Dosing instructions and VO given to [place], RN @ [facility] while in patient's home.  [phone].

## 2019-07-12 ENCOUNTER — Encounter (HOSPITAL_COMMUNITY): Payer: Self-pay

## 2019-07-12 ENCOUNTER — Ambulatory Visit (HOSPITAL_COMMUNITY)
Admission: EM | Admit: 2019-07-12 | Discharge: 2019-07-12 | Disposition: A | Discharge status: Home / Self Care | Payer: Medicare Other | Attending: Family Medicine | Admitting: Family Medicine

## 2019-07-12 ENCOUNTER — Ambulatory Visit (INDEPENDENT_AMBULATORY_CARE_PROVIDER_SITE_OTHER): Payer: Medicare Other

## 2019-07-12 ENCOUNTER — Other Ambulatory Visit: Payer: Self-pay

## 2019-07-12 DIAGNOSIS — R109 Unspecified abdominal pain: Secondary | ICD-10-CM

## 2019-07-12 DIAGNOSIS — W19XXXA Unspecified fall, initial encounter: Secondary | ICD-10-CM

## 2019-07-12 DIAGNOSIS — R0781 Pleurodynia: Secondary | ICD-10-CM

## 2019-07-12 MED ORDER — TRAMADOL HCL 50 MG PO TABS: 50.0000 mg | ORAL_TABLET | Freq: Two times a day (BID) | ORAL | 0 refills | Status: DC | PRN

## 2019-07-12 MED ORDER — TIZANIDINE HCL 2 MG PO TABS: 2.0000 mg | ORAL_TABLET | Freq: Four times a day (QID) | ORAL | 0 refills | Status: DC | PRN

## 2019-07-12 NOTE — Discharge Instructions (Addendum)
Take tylenol as needed. Take the tramadol as prescribed.  Rest and elevate your hand.  Apply ice packs 2-3 times a day for up to 20 minutes each.    Follow up with your primary care provider or an orthopedist if you symptoms continue or worsen;  Or if you develop new symptoms, such as numbness, tingling, or weakness.

## 2019-07-12 NOTE — ED Triage Notes (Signed)
Pt is here with left rib pain after falling early this morning, pt has taken Tylenol to relieve discomfort.

## 2019-07-13 ENCOUNTER — Telehealth: Payer: Self-pay | Admitting: Family Medicine

## 2019-07-13 NOTE — Telephone Encounter (Signed)
[facility] called to let you know that the patient fell lastnight and hit his left side of his ribs on the couch and he went to the Urgent Care and had X-Rays done.

## 2019-07-14 NOTE — Telephone Encounter (Signed)
noted 

## 2019-07-15 ENCOUNTER — Telehealth (HOSPITAL_COMMUNITY): Payer: Self-pay | Admitting: Urgent Care

## 2019-07-15 MED ORDER — TIZANIDINE HCL 2 MG PO TABS: 2.0000 mg | ORAL_TABLET | Freq: Four times a day (QID) | ORAL | 0 refills | Status: DC | PRN

## 2019-07-15 NOTE — ED Provider Notes (Signed)
[facility]   [id number] 07/12/19 Arrival Time: 1708  PY:PPJKD PAIN  SUBJECTIVE: History from: patient. Gary Yates. is a 86 y.o. male complains of left rib pain that began last night. Reports that he tripped at home and fell and landed on the back of his couch. Reports that there is pain with deep breathing, movement rhomboid, coughing, laughing. Has taken Tylenol for his pain with little relief. Describes the pain as intermittent and sharp. Symptoms are made worse with activity.  Denies similar symptoms in the past.  Denies fever, chills, erythema, ecchymosis, effusion, weakness, numbness and tingling, saddle paresthesias, loss of bowel or bladder function.      ROS: As per HPI.  All other pertinent ROS negative.     Past Medical History:  Diagnosis Date  . Anemia in chronic renal disease 05/07/2017  . Anxiety   . Atrial fibrillation ([place])   . COPD (chronic obstructive pulmonary disease) ([place])    pt. denies  . Coronary artery disease    a. h/o Overlapping stents RCA;  b. 06/2011 Cath: patent stents, nonobs dzs, NL EF.  . Diabetic peripheral neuropathy ([place])   . Diffuse non-Hodgkin's lymphoma of testis ([place]) 09/28/2015  . DM (diabetes mellitus) ([place])    Type 2, peripheral neuropathy.  . Dyspnea    with exertion  . Dysrhythmia   . GERD (gastroesophageal reflux disease)   . Headache   . History of bronchitis   . History of kidney stones   . History of radiation therapy 02/19/16 - 03/13/16   Testis/Scrotum: 32.4 Gy in 18 fractions  . History of radiation therapy 08/07/16-08/20/16   left adrenal gland mass treated to 30 Gy in 10 fractions  . Hyperlipidemia   . Hypertension   . Iron deficiency anemia due to chronic blood loss 08/08/2017  . Low testosterone   . Nephrolithiasis   . OSA (obstructive sleep apnea) 11/26/2017  . Osteoarthritis    shoulder  . Restless leg   . SVT (supraventricular tachycardia) ([place])   . Urinary frequency   . Wears partial dentures    upper and  lower   Past Surgical History:  Procedure Laterality Date  . APPENDECTOMY    . [place]  . CARDIAC CATHETERIZATION  01/2013  . CATARACT EXTRACTION, BILATERAL    . CHOLECYSTECTOMY    . COLONOSCOPY    . CORONARY ANGIOPLASTY  2004  . CYSTOSCOPY N/A 08/18/2017   Procedure: CYSTOSCOPY WITH FULGURATION AND SUPRA PUBIC TUBE PLACEMENT;  Surgeon: Kathie Rhodes, MD;  Location: [facility];  Service: Urology;  Laterality: N/A;  . EYE SURGERY Bilateral    cataracts  . IR GENERIC HISTORICAL  10/05/2015   IR US GUIDE VASC ACCESS RIGHT 10/05/2015 Marybelle Killings, MD [facility]  . IR GENERIC HISTORICAL  10/05/2015   IR FLUORO GUIDE PORT INSERTION RIGHT 10/05/2015 Marybelle Killings, MD [facility]  . LEFT HEART CATHETERIZATION WITH CORONARY ANGIOGRAM N/A 06/18/2011   Procedure: LEFT HEART CATHETERIZATION WITH CORONARY ANGIOGRAM;  Surgeon: Peter M Martinique, MD;  Location: [facility] CATH LAB;  Service: Cardiovascular;  Laterality: N/A;  . LEFT HEART CATHETERIZATION WITH CORONARY ANGIOGRAM N/A 01/27/2013   Procedure: LEFT HEART CATHETERIZATION WITH CORONARY ANGIOGRAM;  Surgeon: Burnell Blanks, MD;  Location: [facility] CATH LAB;  Service: Cardiovascular;  Laterality: N/A;  . LUMBAR LAMINECTOMY/DECOMPRESSION MICRODISCECTOMY N/A 02/07/2015   Procedure: Lumbar three-Sacral one Decompression;  Surgeon: Kevan Ny Ditty, MD;  Location: [place] NEURO ORS;  Service: Neurosurgery;  Laterality: N/A;  L3 to S1 Decompression  . MULTIPLE TOOTH EXTRACTIONS    . ORCHIECTOMY Right 09/01/2015   Procedure: RIGHT ORCHIECTOMY;  Surgeon: Kathie Rhodes, MD;  Location: [facility];  Service: Urology;  Laterality: Right;  . port a cath in place     . ROTATOR CUFF REPAIR Left    Allergies  Allergen Reactions  . Ace Inhibitors Other (See Comments)    cough  . Codeine Nausea Only and Rash       . Penicillins Rash    Childhood allergy Has patient had a PCN reaction causing immediate rash, facial/tongue/throat swelling, SOB or lightheadedness  with hypotension: Yes Has patient had a PCN reaction causing severe rash involving mucus membranes or skin necrosis: Yes Has patient had a PCN reaction that required hospitalization No Has patient had a PCN reaction occurring within the last 10 years: No If all of the above answers are "NO", then may proceed with Cephalosporin use.    Current Facility-Administered Medications on File Prior to Encounter  Medication Dose Route Frequency Provider Last Rate Last Admin  . acetaminophen (TYLENOL) tablet 650 mg  650 mg Oral Once [place], Sarah M, NP      . pegfilgrastim (NEULASTA ONPRO KIT) injection 6 mg  6 mg Subcutaneous Once Volanda Napoleon, MD      . sodium chloride flush (NS) 0.9 % injection 10 mL  10 mL Intracatheter PRN Volanda Napoleon, MD   10 mL at 02/27/17 1503  . sodium chloride flush (NS) 0.9 % injection 10 mL  10 mL Intravenous PRN Volanda Napoleon, MD   10 mL at 05/07/17 9449  . sodium chloride flush (NS) 0.9 % injection 10 mL  10 mL Intravenous PRN [place], Holli Humbles, NP   10 mL at 04/14/19 6759   Current Outpatient Medications on File Prior to Encounter  Medication Sig Dispense Refill  . acetaminophen (TYLENOL) 500 MG tablet Take 1,000 mg by mouth every 6 (six) hours as needed (for pain/fever/headaches.).     Marland Kitchen albuterol (VENTOLIN HFA) 108 (90 Base) MCG/ACT inhaler Inhale 2 puffs into the lungs every 4 (four) hours as needed for wheezing or shortness of breath. 18 g 1  . atorvastatin (LIPITOR) 40 MG tablet TAKE 1 TABLET BY MOUTH EVERY DAY 30 tablet 2  . docusate sodium (COLACE) 50 MG capsule Take 1 capsule (50 mg total) by mouth 2 (two) times daily as needed (for constipation). 90 capsule 0  . furosemide (LASIX) 40 MG tablet Take 1 tablet (40 mg total) by mouth every other day. Take one tablet by mouth every other day. 45 tablet 1  . gabapentin (NEURONTIN) 300 MG capsule TAKE 2 CAPSULES BY MOUTH 3 TIMES A DAY (Patient taking differently: 900 mg 2 (two) times daily. ) 540 capsule  1  . glucose blood (ACCU-CHEK AVIVA PLUS) test strip 1 each by Other route 3 (three) times daily. for testing 300 each 1  . KLOR-CON M20 20 MEQ tablet TAKE 1 TABLET BY MOUTH EVERY DAY 30 tablet 1  . lidocaine-prilocaine (EMLA) cream Apply 1 application topically as needed. Apply to [facility] one hour before procedure. 60 g 12  . Melatonin 1 MG TABS Take 1 tablet (1 mg total) by mouth at bedtime. (Patient taking differently: Take 10 mg by mouth at bedtime. ) 30 tablet 2  . metFORMIN (GLUCOPHAGE) 500 MG tablet TAKE 1 TABLET BY MOUTH TWICE A DAY WITH MEALS 180 tablet 1  . methylPREDNISolone (MEDROL DOSEPAK) 4 MG TBPK tablet As directed  21 tablet 0  . metoprolol succinate (TOPROL-XL) 25 MG 24 hr tablet Take 1 tablet (25 mg total) by mouth daily. 90 tablet 3  . nitroGLYCERIN (NITROSTAT) 0.4 MG SL tablet Place 1 tablet (0.4 mg total) under the tongue every 5 (five) minutes as needed. Chest pain 25 tablet 6  . nystatin (MYCOSTATIN/NYSTOP) powder APPLY TO AFFECTED AREA 4 TIMES A DAY 60 g 0  . nystatin-triamcinolone ointment (MYCOLOG) Apply 1 application topically 2 (two) times daily. 15 g 0  . pantoprazole (PROTONIX) 40 MG tablet TAKE 1 TABLET BY MOUTH EVERY DAY 90 tablet 3  . polyethylene glycol (MIRALAX / GLYCOLAX) packet Take 17 g by mouth daily. 14 each 0  . pramipexole (MIRAPEX) 1.5 MG tablet TAKE 2 TABLETS BY MOUTH EVERY EVENING 180 tablet 1  . warfarin (COUMADIN) 5 MG tablet Take 1 tablet daily except take 1 1/2 tablets on Sundays only or Take as directed by anticoagulation clinic. (Patient taking differently: 04/14/2019 Takes 5 mg daily except M,W,F takes 2.54m.) 100 tablet 1  . [DISCONTINUED] metoprolol (LOPRESSOR) 50 MG tablet     . [DISCONTINUED] spironolactone (ALDACTONE) 25 MG tablet      Social History   Socioeconomic History  . Marital status: Married    Spouse name: Not on file  . Number of children: 3  . Years of education: Not on file  . Highest education level: Not on file  Occupational  History  . 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 Smoking status: Former Smoker    Packs/day: 1.50    Years: 20.00    Pack years: 30.00    Types: Cigarettes    Quit date: 04/05/1978    Years since quitting: 41.3  . Smokeless tobacco: Never Used  Vaping Use  . Vaping Use: Never used  Substance and Sexual Activity  . Alcohol use: No  . Drug use: No  . Sexual activity: Yes    Birth control/protection: None    Comment: Married  Other Topics Concern  . Not on file  Social History Narrative  . Not on file   Social Determinants of Health   Financial Resource Strain:   . Difficulty of Paying Living Expenses:   Food Insecurity:   . Worried About RCharity fundraiserin the Last Year:   . RArboriculturistin the Last Year:   Transportation Needs:   . LFilm/video editor(Medical):   .Marland KitchenLack of Transportation (Non-Medical):   Physical Activity:   . Days of Exercise per Week:   . Minutes of Exercise per Session:   Stress:   . Feeling of Stress :   Social Connections:   . Frequency of Communication with Friends and Family:   . Frequency of Social Gatherings with Friends and Family:   . Attends Religious Services:   . Active Member of Clubs or Organizations:   . Attends CArchivistMeetings:   .Marland KitchenMarital Status:   Intimate Partner Violence:   . Fear of Current or Ex-Partner:   . Emotionally Abused:   .Marland KitchenPhysically Abused:   . Sexually Abused:    Family History  Problem Relation Age of Onset  . Alzheimer's disease Mother   . Heart disease Mother   . Heart disease Father   . Migraines Father   . Ulcers Father   . Prostate cancer Brother   . Coronary artery disease Other        Male 1st degree relative <  36  . Coronary artery disease Other        male 1st degree relative <60  . Heart disease Sister   . Obesity Sister        Morbid  . Arthritis Sister   . Heart disease Brother   . Arthritis Brother   . Sleep apnea Son   . Obesity Son   .  Migraines Daughter   . Thyroid disease Daughter     OBJECTIVE:  Vitals:   07/12/19 1723 07/12/19 1724  BP:  (!) 108/51  Pulse:  77  Resp:  18  Temp:  98.4 F (36.9 C)  TempSrc:  Oral  SpO2:  98%  Weight: 227 lb (103 kg)     General appearance: ALERT; in no acute distress.  Head: NCAT Lungs: Normal respiratory effort CV:  pulses 2+ bilaterally. Cap refill < 2 seconds Musculoskeletal:  Inspection: Skin warm, dry, clear and intact without obvious erythema, effusion; ecchymosis noted to anterior left lower ribs. Palpation: Anterior left lower ribs tender to palpation ROM: FROM active and passive Skin: warm and dry Neurologic: Ambulates without difficulty; Sensation intact about the upper/ lower extremities Psychological: alert and cooperative; normal mood and affect  DIAGNOSTIC STUDIES:  No results found.   ASSESSMENT & PLAN:  1. Rib pain on left side   2. Fall, initial encounter      Meds ordered this encounter  Medications  . tiZANidine (ZANAFLEX) 2 MG tablet    Sig: Take 1 tablet (2 mg total) by mouth every 6 (six) hours as needed for muscle spasms.    Dispense:  30 tablet    Refill:  0    Order Specific Question:   Supervising Provider    Answer:   Chase Picket [id number]  . traMADol (ULTRAM) 50 MG tablet    Sig: Take 1 tablet (50 mg total) by mouth 2 (two) times daily as needed for moderate pain or severe pain.    Dispense:  15 tablet    Refill:  0    Order Specific Question:   Supervising Provider    Answer:   Chase Picket [id number]   Rib series negative today Continue conservative management of rest, ice, and gentle stretches Prescribed tramadol for pain Take as prescribed No ibuprofen or naproxen with warfarin Take tizanidine at nighttime for symptomatic relief. Avoid driving or operating heavy machinery while using medication. Follow up with PCP if symptoms persist Return or go to the ER if you have any new or worsening symptoms (fever,  chills, chest pain, abdominal pain, changes in bowel or bladder habits, pain radiating into lower legs)   [place] Controlled Substances Registry consulted for this patient. I feel the risk/benefit ratio today is favorable for proceeding with this prescription for a controlled substance. Medication sedation precautions given.  Reviewed expectations re: course of current medical issues. Questions answered. Outlined signs and symptoms indicating need for more acute intervention. Patient verbalized understanding. After Visit Summary given.       Faustino Congress, NP 07/15/19 [phone]

## 2019-07-15 NOTE — Telephone Encounter (Signed)
Patient needs tizanidine resent to the pharmacy.

## 2019-07-15 NOTE — Progress Notes (Signed)
Agree with coumadin management as per Villa Herb, RN  [name] MD [facility]

## 2019-07-19 ENCOUNTER — Ambulatory Visit: Payer: Medicare Other | Admitting: Family Medicine

## 2019-07-19 ENCOUNTER — Other Ambulatory Visit: Payer: Self-pay | Admitting: Family Medicine

## 2019-07-19 ENCOUNTER — Encounter: Payer: Self-pay | Admitting: Family Medicine

## 2019-07-19 ENCOUNTER — Other Ambulatory Visit: Payer: Self-pay

## 2019-07-19 VITALS — BP 118/48 | HR 95 | Temp 97.7°F | Wt 232.6 lb

## 2019-07-19 DIAGNOSIS — R0789 Other chest pain: Secondary | ICD-10-CM

## 2019-07-19 DIAGNOSIS — R600 Localized edema: Secondary | ICD-10-CM

## 2019-07-19 MED ORDER — HYDROCODONE-ACETAMINOPHEN 5-325 MG PO TABS: 1.0000 | ORAL_TABLET | Freq: Four times a day (QID) | ORAL | 0 refills | Status: DC | PRN

## 2019-07-19 NOTE — Progress Notes (Signed)
Established Patient Office Visit  Subjective:  Patient ID: Gary Yates., male    DOB: 08-26-1933  Age: 86 y.o. MRN: [id number]  CC:  Chief Complaint  Patient presents with  . Fall    pt had a fall last monday went to urgent care still having rib pain and swelling in his legs     HPI Gary Yates. presents for follow-up after recent fall with persistent left chest wall pain and also for increasing bilateral leg edema.  Recent history is that last Monday he states he fell and landed against the arm of a couch.  There was no syncope.  He is not sure exactly what led to his fall.  He apparently bounced off that and fell into a small chest.  He was seen at local urgent care and had rib films which showed no definite fracture.  Those notes were reviewed.  He was prescribed tramadol and Zanaflex.  He has not gotten any relief from either.  He states his pain is currently 8 out of 10 severity and interfering with sleep.  Previous intolerance with codeine.  He thinks he has tolerated hydrocodone in the past.  He had some bilateral leg edema in the past.  Increasing edema especially past few weeks.  Has been on regimen of Lasix 40 mg every other day.  He has multiple chronic problems including history of CAD, hypertension, obstructive sleep apnea, type 2 diabetes, peripheral neuropathy.  Does take fairly high-dose gabapentin but has been on this for several years.  He had echocardiogram 06/10/2017 with ejection fraction of 55 to 60%.  Has been sleeping propped up in recliner for years.  Does have some chronic kidney disease.  His weight is relatively stable by our scales  Past Medical History:  Diagnosis Date  . Anemia in chronic renal disease 05/07/2017  . Anxiety   . Atrial fibrillation ([place])   . COPD (chronic obstructive pulmonary disease) ([place])    pt. denies  . Coronary artery disease    a. h/o Overlapping stents RCA;  b. 06/2011 Cath: patent stents, nonobs dzs, NL EF.  . Diabetic  peripheral neuropathy ([name])   . Diffuse non-Hodgkin's lymphoma of testis ([place]) 09/28/2015  . DM (diabetes mellitus) ([place])    Type 2, peripheral neuropathy.  . Dyspnea    with exertion  . Dysrhythmia   . GERD (gastroesophageal reflux disease)   . Headache   . History of bronchitis   . History of kidney stones   . History of radiation therapy 02/19/16 - 03/13/16   Testis/Scrotum: 32.4 Gy in 18 fractions  . History of radiation therapy 08/07/16-08/20/16   left adrenal gland mass treated to 30 Gy in 10 fractions  . Hyperlipidemia   . Hypertension   . Iron deficiency anemia due to chronic blood loss 08/08/2017  . Low testosterone   . Nephrolithiasis   . OSA (obstructive sleep apnea) 11/26/2017  . Osteoarthritis    shoulder  . Restless leg   . SVT (supraventricular tachycardia) ([place])   . Urinary frequency   . Wears partial dentures    upper and lower    Past Surgical History:  Procedure Laterality Date  . APPENDECTOMY    . [place]  . CARDIAC CATHETERIZATION  01/2013  . CATARACT EXTRACTION, BILATERAL    . CHOLECYSTECTOMY    . COLONOSCOPY    . CORONARY ANGIOPLASTY  2004  . CYSTOSCOPY N/A 08/18/2017   Procedure: CYSTOSCOPY WITH  FULGURATION AND SUPRA PUBIC TUBE PLACEMENT;  Surgeon: Kathie Rhodes, MD;  Location: [facility];  Service: Urology;  Laterality: N/A;  . EYE SURGERY Bilateral    cataracts  . IR GENERIC HISTORICAL  10/05/2015   IR US GUIDE VASC ACCESS RIGHT 10/05/2015 Marybelle Killings, MD [facility]  . IR GENERIC HISTORICAL  10/05/2015   IR FLUORO GUIDE PORT INSERTION RIGHT 10/05/2015 Marybelle Killings, MD [facility]  . LEFT HEART CATHETERIZATION WITH CORONARY ANGIOGRAM N/A 06/18/2011   Procedure: LEFT HEART CATHETERIZATION WITH CORONARY ANGIOGRAM;  Surgeon: Peter M Martinique, MD;  Location: [facility] CATH LAB;  Service: Cardiovascular;  Laterality: N/A;  . LEFT HEART CATHETERIZATION WITH CORONARY ANGIOGRAM N/A 01/27/2013   Procedure: LEFT HEART CATHETERIZATION WITH CORONARY ANGIOGRAM;   Surgeon: Burnell Blanks, MD;  Location: [facility] CATH LAB;  Service: Cardiovascular;  Laterality: N/A;  . LUMBAR LAMINECTOMY/DECOMPRESSION MICRODISCECTOMY N/A 02/07/2015   Procedure: Lumbar three-Sacral one Decompression;  Surgeon: Kevan Ny Ditty, MD;  Location: [place] NEURO ORS;  Service: Neurosurgery;  Laterality: N/A;  L3 to S1 Decompression  . MULTIPLE TOOTH EXTRACTIONS    . ORCHIECTOMY Right 09/01/2015   Procedure: RIGHT ORCHIECTOMY;  Surgeon: Kathie Rhodes, MD;  Location: [facility];  Service: Urology;  Laterality: Right;  . port a cath in place     . ROTATOR CUFF REPAIR Left     Family History  Problem Relation Age of Onset  . Alzheimer's disease Mother   . Heart disease Mother   . Heart disease Father   . Migraines Father   . Ulcers Father   . Prostate cancer Brother   . Coronary artery disease Other        Male 1st degree relative <50  . Coronary artery disease Other        male 1st degree relative <60  . Heart disease Sister   . Obesity Sister        Morbid  . Arthritis Sister   . Heart disease Brother   . Arthritis Brother   . Sleep apnea Son   . Obesity Son   . Migraines Daughter   . Thyroid disease Daughter     Social History   Socioeconomic History  . Marital status: Married    Spouse name: Not on file  . Number of children: 3  . Years of education: Not on file  . Highest education level: Not on file  Occupational History  . Occupation: Retired    Fish farm manager: RETIRED  Tobacco Use  . Smoking status: Former Smoker    Packs/day: 1.50    Years: 20.00    Pack years: 30.00    Types: Cigarettes    Quit date: 04/05/1978    Years since quitting: 41.3  . Smokeless tobacco: Never Used  Vaping Use  . Vaping Use: Never used  Substance and Sexual Activity  . Alcohol use: No  . Drug use: No  . Sexual activity: Yes    Birth control/protection: None    Comment: Married  Other Topics Concern  . Not on file  Social History Narrative  . Not on file   Social  Determinants of Health   Financial Resource Strain:   . Difficulty of Paying Living Expenses:   Food Insecurity:   . Worried About Charity fundraiser in the Last Year:   . Arboriculturist in the Last Year:   Transportation Needs:   . Film/video editor (Medical):   Marland Kitchen Lack of Transportation (Non-Medical):   Physical Activity:   .  Days of Exercise per Week:   . Minutes of Exercise per Session:   Stress:   . Feeling of Stress :   Social Connections:   . Frequency of Communication with Friends and Family:   . Frequency of Social Gatherings with Friends and Family:   . Attends Religious Services:   . Active Member of Clubs or Organizations:   . Attends Archivist Meetings:   Marland Kitchen Marital Status:   Intimate Partner Violence:   . Fear of Current or Ex-Partner:   . Emotionally Abused:   Marland Kitchen Physically Abused:   . Sexually Abused:     Outpatient Medications Prior to Visit  Medication Sig Dispense Refill  . acetaminophen (TYLENOL) 500 MG tablet Take 1,000 mg by mouth every 6 (six) hours as needed (for pain/fever/headaches.).     Marland Kitchen albuterol (VENTOLIN HFA) 108 (90 Base) MCG/ACT inhaler Inhale 2 puffs into the lungs every 4 (four) hours as needed for wheezing or shortness of breath. 18 g 1  . atorvastatin (LIPITOR) 40 MG tablet TAKE 1 TABLET BY MOUTH EVERY DAY 30 tablet 2  . docusate sodium (COLACE) 50 MG capsule Take 1 capsule (50 mg total) by mouth 2 (two) times daily as needed (for constipation). 90 capsule 0  . furosemide (LASIX) 40 MG tablet Take 1 tablet (40 mg total) by mouth every other day. Take one tablet by mouth every other day. 45 tablet 1  . gabapentin (NEURONTIN) 300 MG capsule TAKE 2 CAPSULES BY MOUTH 3 TIMES A DAY (Patient taking differently: 900 mg 2 (two) times daily. ) 540 capsule 1  . glucose blood (ACCU-CHEK AVIVA PLUS) test strip 1 each by Other route 3 (three) times daily. for testing 300 each 1  . KLOR-CON M20 20 MEQ tablet TAKE 1 TABLET BY MOUTH EVERY DAY  30 tablet 1  . lidocaine-prilocaine (EMLA) cream Apply 1 application topically as needed. Apply to [facility] one hour before procedure. 60 g 12  . Melatonin 1 MG TABS Take 1 tablet (1 mg total) by mouth at bedtime. (Patient taking differently: Take 10 mg by mouth at bedtime. ) 30 tablet 2  . metFORMIN (GLUCOPHAGE) 500 MG tablet TAKE 1 TABLET BY MOUTH TWICE A DAY WITH MEALS 180 tablet 1  . methylPREDNISolone (MEDROL DOSEPAK) 4 MG TBPK tablet As directed 21 tablet 0  . metoprolol succinate (TOPROL-XL) 25 MG 24 hr tablet Take 1 tablet (25 mg total) by mouth daily. 90 tablet 3  . nitroGLYCERIN (NITROSTAT) 0.4 MG SL tablet Place 1 tablet (0.4 mg total) under the tongue every 5 (five) minutes as needed. Chest pain 25 tablet 6  . nystatin (MYCOSTATIN/NYSTOP) powder APPLY TO AFFECTED AREA 4 TIMES A DAY 60 g 0  . nystatin-triamcinolone ointment (MYCOLOG) Apply 1 application topically 2 (two) times daily. 15 g 0  . pantoprazole (PROTONIX) 40 MG tablet TAKE 1 TABLET BY MOUTH EVERY DAY 90 tablet 3  . polyethylene glycol (MIRALAX / GLYCOLAX) packet Take 17 g by mouth daily. 14 each 0  . pramipexole (MIRAPEX) 1.5 MG tablet TAKE 2 TABLETS BY MOUTH EVERY EVENING 180 tablet 1  . warfarin (COUMADIN) 5 MG tablet Take 1 tablet daily except take 1 1/2 tablets on Sundays only or Take as directed by anticoagulation clinic. (Patient taking differently: 04/14/2019 Takes 5 mg daily except M,W,F takes 2.68m.) 100 tablet 1  . tiZANidine (ZANAFLEX) 2 MG tablet Take 1 tablet (2 mg total) by mouth every 6 (six) hours as needed for muscle spasms. 30 tablet  0  . traMADol (ULTRAM) 50 MG tablet Take 1 tablet (50 mg total) by mouth 2 (two) times daily as needed for moderate pain or severe pain. 15 tablet 0   Facility-Administered Medications Prior to Visit  Medication Dose Route Frequency Provider Last Rate Last Admin  . acetaminophen (TYLENOL) tablet 650 mg  650 mg Oral Once [place], Sarah M, NP      . pegfilgrastim (NEULASTA ONPRO KIT)  injection 6 mg  6 mg Subcutaneous Once Volanda Napoleon, MD      . sodium chloride flush (NS) 0.9 % injection 10 mL  10 mL Intracatheter PRN Volanda Napoleon, MD   10 mL at 02/27/17 1503  . sodium chloride flush (NS) 0.9 % injection 10 mL  10 mL Intravenous PRN Volanda Napoleon, MD   10 mL at 05/07/17 7948  . sodium chloride flush (NS) 0.9 % injection 10 mL  10 mL Intravenous PRN Eliezer Bottom, NP   10 mL at 04/14/19 0165    Allergies  Allergen Reactions  . Ace Inhibitors Other (See Comments)    cough  . Codeine Nausea Only and Rash       . Penicillins Rash    Childhood allergy Has patient had a PCN reaction causing immediate rash, facial/tongue/throat swelling, SOB or lightheadedness with hypotension: Yes Has patient had a PCN reaction causing severe rash involving mucus membranes or skin necrosis: Yes Has patient had a PCN reaction that required hospitalization No Has patient had a PCN reaction occurring within the last 10 years: No If all of the above answers are "NO", then may proceed with Cephalosporin use.    Wt Readings from Last 3 Encounters:  07/19/19 232 lb 9.6 oz (105.5 kg)  07/12/19 227 lb (103 kg)  05/10/19 234 lb (106.1 kg)     ROS Review of Systems  Constitutional: Negative for chills and fever.  Respiratory: Negative for cough and wheezing.   Cardiovascular: Positive for leg swelling. Negative for palpitations.  Gastrointestinal: Negative for abdominal pain.  Neurological: Negative for syncope.  Psychiatric/Behavioral: Negative for confusion.      Objective:    Physical Exam Vitals reviewed.  Constitutional:      Appearance: Normal appearance.  Cardiovascular:     Rate and Rhythm: Normal rate.  Pulmonary:     Effort: Pulmonary effort is normal.     Breath sounds: Normal breath sounds.     Comments: Left chest wall examined.  No ecchymosis.  He is very tender over the left anterior lower rib cage area. Musculoskeletal:     Comments: 1+ pitting  edema legs bilaterally  Neurological:     Mental Status: He is alert.     BP (!) 118/48 (BP Location: Left Arm, Patient Position: Sitting, Cuff Size: Normal)   Pulse 95   Temp 97.7 F (36.5 C) (Oral)   Wt 232 lb 9.6 oz (105.5 kg)   SpO2 96%   BMI 33.37 kg/m  Wt Readings from Last 3 Encounters:  07/19/19 232 lb 9.6 oz (105.5 kg)  07/12/19 227 lb (103 kg)  05/10/19 234 lb (106.1 kg)     Health Maintenance Due  Topic Date Due  . COVID-19 Vaccine (1) Never done  . FOOT EXAM  08/06/2014    There are no preventive care reminders to display for this patient.  Lab Results  Component Value Date   TSH 4.795 (H) 08/05/2018   Lab Results  Component Value Date   WBC 5.6 04/14/2019  HGB 9.5 (L) 04/14/2019   HCT 28.8 (L) 04/14/2019   MCV 96.3 04/14/2019   PLT 136 (L) 04/14/2019   Lab Results  Component Value Date   NA 136 04/14/2019   K 4.0 04/14/2019   CHLORIDE 104 12/14/2015   CO2 24 04/14/2019   GLUCOSE 125 (H) 04/14/2019   BUN 24 (H) 04/14/2019   CREATININE 1.41 (H) 04/14/2019   BILITOT 0.4 04/14/2019   ALKPHOS 81 04/14/2019   AST 12 (L) 04/14/2019   ALT 4 04/14/2019   PROT 6.7 04/14/2019   ALBUMIN 3.6 04/14/2019   CALCIUM 9.2 04/14/2019   ANIONGAP 8 04/14/2019   EGFR 76 (L) 12/14/2015   GFR 38.06 (L) 03/22/2019   Lab Results  Component Value Date   CHOL 166 03/22/2019   Lab Results  Component Value Date   HDL 29.80 (L) 03/22/2019   Lab Results  Component Value Date   LDLCALC 53 09/15/2017   Lab Results  Component Value Date   TRIG 373.0 (H) 03/22/2019   Lab Results  Component Value Date   CHOLHDL 6 03/22/2019   Lab Results  Component Value Date   HGBA1C 7.0 (H) 03/22/2019      Assessment & Plan:   Problem List Items Addressed This Visit      Unprioritized   Bilateral leg edema    Other Visit Diagnoses    Left-sided chest wall pain    -  Primary    Recent x-rays revealed no fracture.  We explained that hairline rib fractures could  be difficult to assess but we did not see any sense in doing repeat imaging at this time.  We have stressed importance of taking good deep breaths several times daily to avoid atelectasis and especially pneumonia  Leave off Zanaflex and tramadol which are not helping  Cautious short-term use of Vicodin 5/325 mg 1 every 6 hours as needed for severe pain.  We cautioned about sedation and constipation.  Consider over-the-counter Colace 1-2 daily and increase fluids  Elevate legs frequently.  Start back Lasix 40 mg daily -Reassess in 1 week and recheck basic metabolic panel then  Meds ordered this encounter  Medications  . HYDROcodone-acetaminophen (NORCO/VICODIN) 5-325 MG tablet    Sig: Take 1 tablet by mouth every 6 (six) hours as needed for moderate pain.    Dispense:  20 tablet    Refill:  0    Follow-up: Return in about 1 week (around 07/26/2019).    Carolann Littler, MD

## 2019-07-19 NOTE — Progress Notes (Signed)
This INR on 6/1, needs to also be signed by a provider at [facility] since done there. Pls cosign for B12 inj.Marland KitchenJohny Chess

## 2019-07-19 NOTE — Patient Instructions (Signed)
Consider daily use of stool softener such as colace 1-2 daily while taking the pain medication  Take several deep breaths per day  Consider OTC lidocaine patch  Get back on daily lasix 40 mg   .     Elevate legs frequently.

## 2019-07-20 ENCOUNTER — Ambulatory Visit (INDEPENDENT_AMBULATORY_CARE_PROVIDER_SITE_OTHER): Payer: Medicare Other | Admitting: General Practice

## 2019-07-20 DIAGNOSIS — Z7901 Long term (current) use of anticoagulants: Secondary | ICD-10-CM

## 2019-07-20 DIAGNOSIS — I480 Paroxysmal atrial fibrillation: Secondary | ICD-10-CM | POA: Diagnosis not present

## 2019-07-20 LAB — POCT INR: INR: 3.4 — AB (ref 2.0–3.0)

## 2019-07-20 NOTE — Progress Notes (Signed)
Medical screening examination/treatment/procedure(s) were performed by non-physician practitioner and as supervising physician I was immediately available for consultation/collaboration. I agree with above. Tarun Patchell, MD   

## 2019-07-20 NOTE — Patient Instructions (Signed)
Pre visit review using our clinic review tool, if applicable. No additional management support is needed unless otherwise documented below in the visit note.  Skip coumadin today and change dosage and take  5 mg daily except 2.5 mg on Mon, Wed and Fridays and Saturdays.  Re-check in 2 weeks.  Dosing instructions Olivia Mackie, RN @ [facility] while in patient's home. [phone]

## 2019-07-23 ENCOUNTER — Emergency Department (HOSPITAL_COMMUNITY): Payer: Medicare Other

## 2019-07-23 ENCOUNTER — Other Ambulatory Visit: Payer: Self-pay

## 2019-07-23 ENCOUNTER — Emergency Department (HOSPITAL_COMMUNITY)
Admission: EM | Admit: 2019-07-23 | Discharge: 2019-07-23 | Disposition: A | Discharge status: Home / Self Care | Payer: Medicare Other | Attending: Emergency Medicine | Admitting: Emergency Medicine

## 2019-07-23 ENCOUNTER — Encounter (HOSPITAL_COMMUNITY): Payer: Self-pay

## 2019-07-23 DIAGNOSIS — Y929 Unspecified place or not applicable: Secondary | ICD-10-CM | POA: Insufficient documentation

## 2019-07-23 DIAGNOSIS — W19XXXA Unspecified fall, initial encounter: Secondary | ICD-10-CM | POA: Diagnosis not present

## 2019-07-23 DIAGNOSIS — S299XXA Unspecified injury of thorax, initial encounter: Secondary | ICD-10-CM | POA: Diagnosis present

## 2019-07-23 DIAGNOSIS — R42 Dizziness and giddiness: Secondary | ICD-10-CM | POA: Diagnosis not present

## 2019-07-23 DIAGNOSIS — Z7984 Long term (current) use of oral hypoglycemic drugs: Secondary | ICD-10-CM | POA: Diagnosis not present

## 2019-07-23 DIAGNOSIS — Y939 Activity, unspecified: Secondary | ICD-10-CM | POA: Insufficient documentation

## 2019-07-23 DIAGNOSIS — T83511A Infection and inflammatory reaction due to indwelling urethral catheter, initial encounter: Secondary | ICD-10-CM | POA: Diagnosis not present

## 2019-07-23 DIAGNOSIS — R109 Unspecified abdominal pain: Secondary | ICD-10-CM | POA: Insufficient documentation

## 2019-07-23 DIAGNOSIS — R2243 Localized swelling, mass and lump, lower limb, bilateral: Secondary | ICD-10-CM | POA: Insufficient documentation

## 2019-07-23 DIAGNOSIS — Z789 Other specified health status: Secondary | ICD-10-CM

## 2019-07-23 DIAGNOSIS — I129 Hypertensive chronic kidney disease with stage 1 through stage 4 chronic kidney disease, or unspecified chronic kidney disease: Secondary | ICD-10-CM | POA: Insufficient documentation

## 2019-07-23 DIAGNOSIS — Z91040 Latex allergy status: Secondary | ICD-10-CM | POA: Diagnosis not present

## 2019-07-23 DIAGNOSIS — J449 Chronic obstructive pulmonary disease, unspecified: Secondary | ICD-10-CM | POA: Insufficient documentation

## 2019-07-23 DIAGNOSIS — E1122 Type 2 diabetes mellitus with diabetic chronic kidney disease: Secondary | ICD-10-CM | POA: Diagnosis not present

## 2019-07-23 DIAGNOSIS — N183 Chronic kidney disease, stage 3 unspecified: Secondary | ICD-10-CM | POA: Diagnosis not present

## 2019-07-23 DIAGNOSIS — R0602 Shortness of breath: Secondary | ICD-10-CM | POA: Diagnosis not present

## 2019-07-23 DIAGNOSIS — Z87891 Personal history of nicotine dependence: Secondary | ICD-10-CM | POA: Diagnosis not present

## 2019-07-23 DIAGNOSIS — E1140 Type 2 diabetes mellitus with diabetic neuropathy, unspecified: Secondary | ICD-10-CM | POA: Insufficient documentation

## 2019-07-23 DIAGNOSIS — Z79899 Other long term (current) drug therapy: Secondary | ICD-10-CM | POA: Diagnosis not present

## 2019-07-23 DIAGNOSIS — Y998 Other external cause status: Secondary | ICD-10-CM | POA: Insufficient documentation

## 2019-07-23 DIAGNOSIS — S2242XA Multiple fractures of ribs, left side, initial encounter for closed fracture: Secondary | ICD-10-CM

## 2019-07-23 DIAGNOSIS — N390 Urinary tract infection, site not specified: Secondary | ICD-10-CM

## 2019-07-23 LAB — PROTIME-INR
INR: 2.2 — ABNORMAL HIGH (ref 0.8–1.2)
Prothrombin Time: 23.5 seconds — ABNORMAL HIGH (ref 11.4–15.2)

## 2019-07-23 LAB — DIFFERENTIAL
Abs Immature Granulocytes: 0.02 10*3/uL (ref 0.00–0.07)
Basophils Absolute: 0.1 10*3/uL (ref 0.0–0.1)
Basophils Relative: 1 %
Eosinophils Absolute: 0.2 10*3/uL (ref 0.0–0.5)
Eosinophils Relative: 3 %
Immature Granulocytes: 0 %
Lymphocytes Relative: 21 %
Lymphs Abs: 1.5 10*3/uL (ref 0.7–4.0)
Monocytes Absolute: 0.5 10*3/uL (ref 0.1–1.0)
Monocytes Relative: 7 %
Neutro Abs: 4.7 10*3/uL (ref 1.7–7.7)
Neutrophils Relative %: 68 %

## 2019-07-23 LAB — CBC
HCT: 31.2 % — ABNORMAL LOW (ref 39.0–52.0)
Hemoglobin: 9.6 g/dL — ABNORMAL LOW (ref 13.0–17.0)
MCH: 30.4 pg (ref 26.0–34.0)
MCHC: 30.8 g/dL (ref 30.0–36.0)
MCV: 98.7 fL (ref 80.0–100.0)
Platelets: 189 10*3/uL (ref 150–400)
RBC: 3.16 MIL/uL — ABNORMAL LOW (ref 4.22–5.81)
RDW: 15 % (ref 11.5–15.5)
WBC: 7 10*3/uL (ref 4.0–10.5)
nRBC: 0 % (ref 0.0–0.2)

## 2019-07-23 LAB — HEPATIC FUNCTION PANEL
ALT: 7 U/L (ref 0–44)
AST: 13 U/L — ABNORMAL LOW (ref 15–41)
Albumin: 3.3 g/dL — ABNORMAL LOW (ref 3.5–5.0)
Alkaline Phosphatase: 92 U/L (ref 38–126)
Bilirubin, Direct: 0.1 mg/dL (ref 0.0–0.2)
Indirect Bilirubin: 0.2 mg/dL — ABNORMAL LOW (ref 0.3–0.9)
Total Bilirubin: 0.3 mg/dL (ref 0.3–1.2)
Total Protein: 6.9 g/dL (ref 6.5–8.1)

## 2019-07-23 LAB — COMPREHENSIVE METABOLIC PANEL
ALT: 5 U/L (ref 0–44)
AST: 14 U/L — ABNORMAL LOW (ref 15–41)
Albumin: 3.3 g/dL — ABNORMAL LOW (ref 3.5–5.0)
Alkaline Phosphatase: 89 U/L (ref 38–126)
Anion gap: 10 (ref 5–15)
BUN: 22 mg/dL (ref 8–23)
CO2: 26 mmol/L (ref 22–32)
Calcium: 8.5 mg/dL — ABNORMAL LOW (ref 8.9–10.3)
Chloride: 103 mmol/L (ref 98–111)
Creatinine, Ser: 1.68 mg/dL — ABNORMAL HIGH (ref 0.61–1.24)
GFR calc Af Amer: 43 mL/min — ABNORMAL LOW (ref >60)
GFR calc non Af Amer: 37 mL/min — ABNORMAL LOW (ref >60)
Glucose, Bld: 143 mg/dL — ABNORMAL HIGH (ref 70–99)
Potassium: 4 mmol/L (ref 3.5–5.1)
Sodium: 139 mmol/L (ref 135–145)
Total Bilirubin: 0.7 mg/dL (ref 0.3–1.2)
Total Protein: 6.7 g/dL (ref 6.5–8.1)

## 2019-07-23 LAB — URINALYSIS, ROUTINE W REFLEX MICROSCOPIC
Bilirubin Urine: NEGATIVE
Glucose, UA: NEGATIVE mg/dL
Ketones, ur: NEGATIVE mg/dL
Nitrite: POSITIVE — AB
Protein, ur: NEGATIVE mg/dL
Specific Gravity, Urine: 1.009 (ref 1.005–1.030)
pH: 5 (ref 5.0–8.0)

## 2019-07-23 LAB — I-STAT CHEM 8, ED
BUN: 24 mg/dL — ABNORMAL HIGH (ref 8–23)
Calcium, Ion: 1.16 mmol/L (ref 1.15–1.40)
Chloride: 99 mmol/L (ref 98–111)
Creatinine, Ser: 1.8 mg/dL — ABNORMAL HIGH (ref 0.61–1.24)
Glucose, Bld: 143 mg/dL — ABNORMAL HIGH (ref 70–99)
HCT: 28 % — ABNORMAL LOW (ref 39.0–52.0)
Hemoglobin: 9.5 g/dL — ABNORMAL LOW (ref 13.0–17.0)
Potassium: 3.9 mmol/L (ref 3.5–5.1)
Sodium: 140 mmol/L (ref 135–145)
TCO2: 26 mmol/L (ref 22–32)

## 2019-07-23 LAB — LIPASE, BLOOD: Lipase: 30 U/L (ref 11–51)

## 2019-07-23 LAB — TROPONIN I (HIGH SENSITIVITY)
Troponin I (High Sensitivity): 4 ng/L (ref <18)
Troponin I (High Sensitivity): 5 ng/L (ref <18)

## 2019-07-23 LAB — APTT: aPTT: 45 seconds — ABNORMAL HIGH (ref 24–36)

## 2019-07-23 LAB — CBG MONITORING, ED: Glucose-Capillary: 92 mg/dL (ref 70–99)

## 2019-07-23 LAB — BRAIN NATRIURETIC PEPTIDE: B Natriuretic Peptide: 32.7 pg/mL (ref 0.0–100.0)

## 2019-07-23 MED ORDER — OXYCODONE-ACETAMINOPHEN 5-325 MG PO TABS: 1.0000 | ORAL_TABLET | Freq: Four times a day (QID) | ORAL | 0 refills | Status: DC | PRN

## 2019-07-23 MED ORDER — SODIUM CHLORIDE 0.9 % IV SOLN
1.0000 g | Freq: Once | INTRAVENOUS | Status: AC
  Administered 2019-07-23: 1 g via INTRAVENOUS
  Filled 2019-07-23: qty 1

## 2019-07-23 MED ORDER — MORPHINE SULFATE (PF) 4 MG/ML IV SOLN
4.0000 mg | Freq: Once | INTRAVENOUS | Status: AC
  Administered 2019-07-23: 4 mg via INTRAVENOUS
  Filled 2019-07-23: qty 1

## 2019-07-23 MED ORDER — IOHEXOL 300 MG/ML  SOLN
80.0000 mL | Freq: Once | INTRAMUSCULAR | Status: AC | PRN
  Administered 2019-07-23: 80 mL via INTRAVENOUS

## 2019-07-23 MED ORDER — ONDANSETRON 4 MG PO TBDP: ORAL_TABLET | ORAL | 0 refills | Status: DC

## 2019-07-23 MED ORDER — OXYCODONE-ACETAMINOPHEN 5-325 MG PO TABS
2.0000 | ORAL_TABLET | Freq: Once | ORAL | Status: AC
  Administered 2019-07-23: 2 via ORAL
  Filled 2019-07-23: qty 2

## 2019-07-23 MED ORDER — HYDROMORPHONE HCL 1 MG/ML IJ SOLN
1.0000 mg | Freq: Once | INTRAMUSCULAR | Status: AC
  Administered 2019-07-23: 1 mg via INTRAVENOUS
  Filled 2019-07-23: qty 1

## 2019-07-23 MED ORDER — CEPHALEXIN 250 MG PO CAPS
250.0000 mg | ORAL_CAPSULE | Freq: Three times a day (TID) | ORAL | 0 refills | Status: DC
Start: 2019-07-23 — End: 2019-08-07

## 2019-07-23 MED ORDER — FUROSEMIDE 10 MG/ML IJ SOLN
40.0000 mg | Freq: Once | INTRAMUSCULAR | Status: AC
  Administered 2019-07-23: 40 mg via INTRAVENOUS
  Filled 2019-07-23: qty 4

## 2019-07-23 NOTE — Discharge Instructions (Addendum)
Change Vicodin to Percocet as prescribed.  If you get nauseated with Percocet, take Zofran with it  You may have a small urine infection.  Please take Keflex as prescribed  Use incentive spirometer every 4 hours during the day to help you breathe better.  See your doctor   Return to ER if you have worse chest pain, shortness of breath, abdominal pain, trouble breathing, fever

## 2019-07-23 NOTE — ED Triage Notes (Signed)
Pt arrives to ED w/ c/o mechanical fall x 1 week ago. Pt denies loc, aox4, neuro intact. Pt taking warfarin. Pt states since fall has been having intermittent periods of lightheadedness and dizziness. Pt also c/o L sided rib pain that hurts worse w/ inspiration. Pt went to UC and had x-ray of L ribs and they were neg for fx.

## 2019-07-23 NOTE — ED Provider Notes (Signed)
[facility] EMERGENCY DEPARTMENT Provider Note   CSN: [id number] Arrival date & time: 07/23/19  1350     History Chief Complaint  Patient presents with  . Fall    Gary Yates. is a 86 y.o. Yates hx of COPD, diabetes, HTN, HL, presenting with fall, leg swelling, left sided abdominal pain and rib pain.  States that about a week ago, he had a mechanical fall and injury to left side of his ribs.  He went to urgent care at that time and had negative x-rays.  Patient was put on muscle relaxant and Vicodin.  He states that he has persistent left-sided rib pain and feels lightheaded and dizzy.  He also had leg swelling and was put on Lasix.  He states that the pain is persistent so he came here for evaluation.  Patient is still taking Coumadin for A. Fib.   The history is provided by the patient.       Past Medical History:  Diagnosis Date  . Anemia in chronic renal disease 05/07/2017  . Anxiety   . Atrial fibrillation ([place])   . COPD (chronic obstructive pulmonary disease) ([place])    pt. denies  . Coronary artery disease    a. h/o Overlapping stents RCA;  b. 06/2011 Cath: patent stents, nonobs dzs, NL EF.  . Diabetic peripheral neuropathy ([place])   . 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 DM (diabetes mellitus) ([place])    Type 2, peripheral neuropathy.  . Dyspnea    with exertion  . Dysrhythmia   . GERD (gastroesophageal reflux disease)   . Headache   . History of bronchitis   . History of kidney stones   . History of radiation therapy 02/19/16 - 03/13/16   Testis/Scrotum: 32.4 Gy in 18 fractions  . History of radiation therapy 08/07/16-08/20/16   left adrenal gland mass treated to 30 Gy in 10 fractions  . Hyperlipidemia   . Hypertension   . Iron deficiency anemia due to chronic blood loss 08/08/2017  . Low testosterone   . Nephrolithiasis   . OSA (obstructive sleep apnea) 11/26/2017  . Osteoarthritis    shoulder  . Restless leg   . SVT  (supraventricular tachycardia) ([place])   . Urinary frequency   . Wears partial dentures    upper and lower    Patient Active Problem List   Diagnosis Date Noted  . OSA (obstructive sleep apnea) 11/26/2017  . Iron deficiency anemia due to chronic blood loss 08/08/2017  . Sepsis secondary to UTI ([place]) 06/17/2017  . Thrombocytopenia ([place]) 06/17/2017  . Acute urinary retention 06/17/2017  . Sepsis ([place]) 06/06/2017  . Anemia in chronic renal disease 05/07/2017  . Long term (current) use of anticoagulants 12/10/2016  . V-tach ([place]) 07/02/2016  . Protein-calorie malnutrition, severe 07/02/2016  . Bilateral lower extremity edema 07/01/2016  . Bilateral leg edema 06/11/2016  . 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 Lumbosacral spondylosis with radiculopathy 02/07/2015  . At high risk for falls 05/05/2014  . Encounter for therapeutic drug monitoring 03/15/2013  . Chest pain 02/03/2013  . Obesity (BMI 30-39.9) 09/21/2012  . Type 2 diabetes mellitus with diabetic neuropathy, unspecified ([place]) 07/03/2012  . Chronic kidney disease, stage III (moderate) 07/03/2012  . Restless leg syndrome 12/04/2011  . Long term current use of anticoagulant therapy 07/15/2011  . Paroxysmal atrial fibrillation ([place]) 06/21/2011  . Upper extremity weakness 06/21/2011  . Midsternal chest pain 06/19/2011  . Coronary artery  disease   . Hypertension   . Hyperlipidemia   . GERD (gastroesophageal reflux disease)   . Peripheral neuropathy   . Dyslipidemia 03/27/2011  . Diabetic peripheral neuropathy ([place]) 12/17/2010  . Hypotestosteronism 05/07/2010  . LEG CRAMPS, IDIOPATHIC 08/14/2009  . Insomnia 08/14/2009  . Malignant neoplasm of skin of face 08/14/2009  . OBESITY 08/02/2009  . HYPERTENSION, HEART CONTROLLED W/O ASSOC CHF 03/28/2008  . CAD, NATIVE VESSEL 03/28/2008  . G E R D 01/12/2007  . Morbid obesity ([place]) 09/15/2006  . Hyperlipidemia, mixed 07/01/2006  . Essential hypertension  07/01/2006  . Coronary atherosclerosis 07/01/2006  . OSTEOARTHRITIS 07/01/2006    Past Surgical History:  Procedure Laterality Date  . APPENDECTOMY    . [place]  . CARDIAC CATHETERIZATION  01/2013  . CATARACT EXTRACTION, BILATERAL    . CHOLECYSTECTOMY    . COLONOSCOPY    . CORONARY ANGIOPLASTY  2004  . CYSTOSCOPY N/A 08/18/2017   Procedure: CYSTOSCOPY WITH FULGURATION AND SUPRA PUBIC TUBE PLACEMENT;  Surgeon: Kathie Rhodes, MD;  Location: [facility] ORS;  Service: Urology;  Laterality: N/A;  . EYE SURGERY Bilateral    cataracts  . IR GENERIC HISTORICAL  10/05/2015   IR US GUIDE VASC ACCESS RIGHT 10/05/2015 Marybelle Killings, MD [facility]-INTER[facility] RAD  . IR GENERIC HISTORICAL  10/05/2015   IR FLUORO GUIDE PORT INSERTION RIGHT 10/05/2015 Marybelle Killings, MD [facility]  . LEFT HEART CATHETERIZATION WITH CORONARY ANGIOGRAM N/A 06/18/2011   Procedure: LEFT HEART CATHETERIZATION WITH CORONARY ANGIOGRAM;  Surgeon: Peter M Martinique, MD;  Location: [facility] CATH LAB;  Service: Cardiovascular;  Laterality: N/A;  . LEFT HEART CATHETERIZATION WITH CORONARY ANGIOGRAM N/A 01/27/2013   Procedure: LEFT HEART CATHETERIZATION WITH CORONARY ANGIOGRAM;  Surgeon: Burnell Blanks, MD;  Location: [facility] CATH LAB;  Service: Cardiovascular;  Laterality: N/A;  . LUMBAR LAMINECTOMY/DECOMPRESSION MICRODISCECTOMY N/A 02/07/2015   Procedure: Lumbar three-Sacral one Decompression;  Surgeon: Kevan Ny Ditty, MD;  Location: [facility] NEURO ORS;  Service: Neurosurgery;  Laterality: N/A;  L3 to S1 Decompression  . MULTIPLE TOOTH EXTRACTIONS    . ORCHIECTOMY Right 09/01/2015   Procedure: RIGHT ORCHIECTOMY;  Surgeon: Kathie Rhodes, MD;  Location: [facility] ORS;  Service: Urology;  Laterality: Right;  . port a cath in place     . ROTATOR CUFF REPAIR Left        Family History  Problem Relation Age of Onset  . Alzheimer's disease Mother   . Heart disease Mother   . Heart disease Father   . Migraines Father   . Ulcers Father   . Prostate  cancer Brother   . Coronary artery disease Other        Yates 1st degree relative <50  . Coronary artery disease Other        Yates 1st degree relative <60  . Heart disease Sister   . Obesity Sister        Morbid  . Arthritis Sister   . Heart disease Brother   . Arthritis Brother   . Sleep apnea Son   . Obesity Son   . Migraines Daughter   . Thyroid disease Daughter     Social History   Tobacco Use  . Smoking status: Former Smoker    Packs/day: 1.50    Years: 20.00    Pack years: 30.00    Types: Cigarettes    Quit date: 04/05/1978    Years since quitting: 41.3  . Smokeless tobacco: Never Used  Vaping Use  .  Vaping Use: Never used  Substance Use Topics  . Alcohol use: No  . Drug use: No    Home Medications Prior to Admission medications   Medication Sig Start Date End Date Taking? Authorizing Provider  acetaminophen (TYLENOL) 500 MG tablet Take 1,000 mg by mouth every 6 (six) hours as needed (for pain/fever/headaches.).    Yes [provider]  albuterol (VENTOLIN HFA) 108 (90 Base) MCG/ACT inhaler Inhale 2 puffs into the lungs every 4 (four) hours as needed for wheezing or shortness of breath. 05/24/19  Yes Laurey Morale, MD  atorvastatin (LIPITOR) 40 MG tablet TAKE 1 TABLET BY MOUTH EVERY DAY Patient taking differently: Take 40 mg by mouth daily.  06/08/19  Yes [name], Alinda Sierras, MD  docusate sodium (COLACE) 50 MG capsule Take 1 capsule (50 mg total) by mouth 2 (two) times daily as needed (for constipation). 04/22/16  Yes [name], Alinda Sierras, MD  furosemide (LASIX) 40 MG tablet Take 1 tablet (40 mg total) by mouth every other day. Take one tablet by mouth every other day. Patient taking differently: Take 40 mg by mouth daily.  08/10/18  Yes [name], Alinda Sierras, MD  gabapentin (NEURONTIN) 300 MG capsule TAKE 2 CAPSULES BY MOUTH 3 TIMES A DAY Patient taking differently: Take 900 mg by mouth 2 (two) times daily.  03/26/19  Yes [name], Alinda Sierras, MD    HYDROcodone-acetaminophen (NORCO/VICODIN) 5-325 MG tablet Take 1 tablet by mouth every 6 (six) hours as needed for moderate pain. 07/19/19  Yes [name], Alinda Sierras, MD  KLOR-CON M20 20 MEQ tablet TAKE 1 TABLET BY MOUTH EVERY DAY Patient taking differently: Take 20 mEq by mouth in the morning.  07/19/19  Yes [name], Alinda Sierras, MD  lidocaine-prilocaine (EMLA) cream Apply 1 application topically as needed. Apply to [facility] one hour before procedure. Patient taking differently: Apply 1 application topically as needed (to PAC one hour before procedure.).  06/29/19  Yes Ennever, Rudell Cobb, MD  metFORMIN (GLUCOPHAGE) 500 MG tablet TAKE 1 TABLET BY MOUTH TWICE A DAY WITH MEALS Patient taking differently: Take 500 mg by mouth 2 (two) times daily with a meal.  03/26/19  Yes [name], Alinda Sierras, MD  metoprolol succinate (TOPROL-XL) 25 MG 24 hr tablet Take 1 tablet (25 mg total) by mouth daily. 11/24/18  Yes Burnell Blanks, MD  nitroGLYCERIN (NITROSTAT) 0.4 MG SL tablet Place 1 tablet (0.4 mg total) under the tongue every 5 (five) minutes as needed. Chest pain Patient taking differently: Place 0.4 mg under the tongue every 5 (five) minutes as needed for chest pain.  01/20/14  Yes Burnell Blanks, MD  nystatin (MYCOSTATIN/NYSTOP) powder APPLY TO AFFECTED AREA 4 TIMES A DAY Patient taking differently: Apply 1 application topically 4 (four) times daily as needed (to affected areas).  04/03/19  Yes [name], Alinda Sierras, MD  pantoprazole (PROTONIX) 40 MG tablet TAKE 1 TABLET BY MOUTH EVERY DAY Patient taking differently: Take 40 mg by mouth in the morning.  11/30/18  Yes [name], Alinda Sierras, MD  pramipexole (MIRAPEX) 1.5 MG tablet TAKE 2 TABLETS BY MOUTH EVERY EVENING Patient taking differently: Take 3 mg by mouth every evening.  04/19/19  Yes [name], Alinda Sierras, MD  warfarin (COUMADIN) 5 MG tablet Take 1 tablet daily except take 1 1/2 tablets on Sundays only or Take as directed by anticoagulation clinic. Patient  taking differently: Take 2.5-5 mg by mouth See admin instructions. Take 5 mg by mouth at bedtime on Sun/Tues/Thurs and 2.5 mg on Mon/Wed/Fri/Sat  08/11/18  Yes [name], Alinda Sierras, MD  glucose blood (ACCU-CHEK AVIVA PLUS) test strip 1 each by Other route 3 (three) times daily. for testing 08/25/18   [name], MD  Melatonin 1 MG TABS Take 1 tablet (1 mg total) by mouth at bedtime. Patient not taking: Reported on 07/23/2019 08/14/18   [name], MD  methylPREDNISolone (MEDROL DOSEPAK) 4 MG TBPK tablet As directed Patient not taking: Reported on 07/23/2019 05/24/19   Laurey Morale, MD  nystatin-triamcinolone ointment [facility]) Apply 1 application topically 2 (two) times daily. Patient not taking: Reported on 07/23/2019 12/26/17   [name], MD  polyethylene glycol (MIRALAX / Floria Raveling) packet Take 17 g by mouth daily. Patient not taking: Reported on 07/23/2019 06/20/17   Patrecia Pour, Christean Grief, MD  metoprolol [facility]) 50 MG tablet  06/08/10 03/27/11  [provider]  spironolactone (ALDACTONE) 25 MG tablet  01/14/11 03/27/11  [provider]    Allergies    Latex, Ace inhibitors, Codeine, and Penicillins  Review of Systems   Review of Systems  Respiratory: Positive for shortness of breath.   Musculoskeletal:       L rib pain   All other systems reviewed and are negative.   Physical Exam Updated Vital Signs BP 137/75   Pulse 96   Temp 98.2 F (36.8 C) (Oral)   Resp 17   SpO2 95%   Physical Exam Vitals and nursing note reviewed.  HENT:     Head: Normocephalic and atraumatic.     Nose: Nose normal.     Mouth/Throat:     Mouth: Mucous membranes are moist.  Eyes:     Extraocular Movements: Extraocular movements intact.     Pupils: Pupils are equal, round, and reactive to light.  Cardiovascular:     Rate and Rhythm: Normal rate and regular rhythm.     Pulses: Normal pulses.     Heart sounds: Normal heart sounds.  Pulmonary:     Effort: Pulmonary  effort is normal.     Breath sounds: Normal breath sounds.     Comments: + L lower rib tenderness  Abdominal:     General: Abdomen is flat.     Palpations: Abdomen is soft.     Comments: + LUQ tenderness, no obvious ecchymosis   Musculoskeletal:        General: Normal range of motion.     Cervical back: Normal range of motion.     Comments: No midline spinal tenderness, 2+ pedal edema   Skin:    General: Skin is warm.     Capillary Refill: Capillary refill takes less than 2 seconds.  Neurological:     General: No focal deficit present.     Mental Status: He is alert and oriented to person, place, and time.  Psychiatric:        Mood and Affect: Mood normal.        Behavior: Behavior normal.     ED Results / Procedures / Treatments   Labs (all labs ordered are listed, but only abnormal results are displayed) Labs Reviewed  PROTIME-INR - Abnormal; Notable for the following components:      Result Value   Prothrombin Time 23.5 (*)    INR 2.2 (*)    All other components within normal limits  APTT - Abnormal; Notable for the following components:   aPTT 45 (*)    All other components within normal limits  CBC - Abnormal; Notable for the following components:  RBC 3.16 (*)    Hemoglobin 9.6 (*)    HCT 31.2 (*)    All other components within normal limits  COMPREHENSIVE METABOLIC PANEL - Abnormal; Notable for the following components:   Glucose, Bld 143 (*)    Creatinine, Ser 1.68 (*)    Calcium 8.5 (*)    Albumin 3.3 (*)    AST 14 (*)    GFR calc non Af Amer 37 (*)    GFR calc Af Amer 43 (*)    All other components within normal limits  HEPATIC FUNCTION PANEL - Abnormal; Notable for the following components:   Albumin 3.3 (*)    AST 13 (*)    Indirect Bilirubin 0.2 (*)    All other components within normal limits  URINALYSIS, ROUTINE W REFLEX MICROSCOPIC - Abnormal; Notable for the following components:   Color, Urine STRAW (*)    Hgb urine dipstick SMALL (*)     Nitrite POSITIVE (*)    Leukocytes,Ua MODERATE (*)    Bacteria, UA MANY (*)    All other components within normal limits  I-STAT CHEM 8, ED - Abnormal; Notable for the following components:   BUN 24 (*)    Creatinine, Ser 1.80 (*)    Glucose, Bld 143 (*)    Hemoglobin 9.5 (*)    HCT 28.0 (*)    All other components within normal limits  DIFFERENTIAL  BRAIN NATRIURETIC PEPTIDE  LIPASE, BLOOD  CBG MONITORING, ED  TROPONIN I (HIGH SENSITIVITY)  TROPONIN I (HIGH SENSITIVITY)    EKG None  Radiology CT HEAD WO CONTRAST  Result Date: 07/23/2019 CLINICAL DATA:  Dizziness, nonspecific EXAM: CT HEAD WITHOUT CONTRAST TECHNIQUE: Contiguous axial images were obtained from the base of the skull through the vertex without intravenous contrast. COMPARISON:  June 19, 2011 FINDINGS: Brain: No evidence of acute territorial infarction, hemorrhage, hydrocephalus,extra-axial collection or mass lesion/mass effect. There is dilatation the ventricles and sulci consistent with age-related atrophy. Low-attenuation changes in the deep white matter consistent with small vessel ischemia. Vascular: No hyperdense vessel or unexpected calcification. Skull: The skull is intact. No fracture or focal lesion identified. Sinuses/Orbits: Mucosal thickening seen within the right maxillary sinus and ethmoid air cells. The orbits and globes intact. Other: None IMPRESSION: No acute intracranial abnormality. Findings consistent with age related atrophy and chronic small vessel ischemia Ethmoid air cell and right maxillary sinusitis Electronically Signed   By: Prudencio Pair M.D.   On: 07/23/2019 17:33   CT CHEST ABDOMEN PELVIS W CONTRAST  Result Date: 07/23/2019 CLINICAL DATA:  [place] 1 week ago, anticoagulated, left-sided rib pain, pain with inspiration EXAM: CT CHEST, ABDOMEN, AND PELVIS WITH CONTRAST TECHNIQUE: Multidetector CT imaging of the chest, abdomen and pelvis was performed following the standard protocol during bolus  administration of intravenous contrast. CONTRAST:  57mL OMNIPAQUE IOHEXOL 300 MG/ML  SOLN COMPARISON:  06/18/2017, 07/12/2019 FINDINGS: CT CHEST FINDINGS Cardiovascular: The heart and great vessels are unremarkable without pericardial effusion. Moderate atherosclerosis of the aorta and coronary vasculature. Mediastinum/Nodes: No enlarged mediastinal, hilar, or axillary lymph nodes. Thyroid gland, trachea, and esophagus demonstrate no significant findings. Lungs/Pleura: No airspace disease, effusion, or pneumothorax. Central airways are patent. Musculoskeletal: There are minimally displaced left anterior eighth and ninth rib fractures at the costochondral junctions. No other acute displaced fractures. Reconstructed images demonstrate no additional findings. CT ABDOMEN PELVIS FINDINGS Hepatobiliary: No focal liver abnormality is seen. Status post cholecystectomy. No biliary dilatation. Pancreas: Unremarkable. No pancreatic ductal dilatation or surrounding inflammatory changes.  Spleen: Normal in size without focal abnormality. Adrenals/Urinary Tract: There is bilateral renal cortical atrophy. A simple cyst measuring 4.7 cm is unchanged within the ventral aspect left kidney. Within the posterior lower pole right kidney a 4.3 cm hypodensity is again identified. Attenuation is increased since prior study, measuring 31 Hounsfield units. On delayed imaging attenuation is 28 Hounsfield units. Overall, favor hemorrhagic or proteinaceous cyst. There is a stable simple cyst off the medial aspect lower pole right kidney. No urinary tract calculi or obstruction. Suprapubic catheter decompresses the urinary bladder. The adrenals are unremarkable. Stomach/Bowel: No bowel obstruction or ileus. Moderate retained stool throughout the colon. No wall thickening or inflammatory change. Vascular/Lymphatic: Aortic atherosclerosis. No enlarged abdominal or pelvic lymph nodes. Reproductive: Prostate is enlarged but stable. Other: No free  fluid or free gas. No abdominal wall hernia. Musculoskeletal: No acute displaced fractures. Reconstructed images demonstrate no additional findings. IMPRESSION: 1. Minimally displaced left anterior eighth and ninth rib fractures at the costochondral junctions. 2. Otherwise no acute intrathoracic, intra-abdominal, or intrapelvic trauma. 3. Increased density within a right renal hypodensity, previously shown to represent a simple cyst. Favor proteinaceous or hemorrhagic cyst on today's exam. If further evaluation is desired, nonemergent outpatient dedicated renal MRI or renal ultrasound could be considered. 4. Enlarged prostate, with interval placement of a suprapubic catheter decompressing the urinary bladder. 5. Aortic Atherosclerosis (ICD10-I70.0). Electronically Signed   By: Randa Ngo M.D.   On: 07/23/2019 21:55    Procedures Procedures (including critical care time)  Medications Ordered in ED Medications  oxyCODONE-acetaminophen (PERCOCET/ROXICET) 5-325 MG per tablet 2 tablet (has no administration in time range)  morphine 4 MG/ML injection 4 mg (4 mg Intravenous Given 07/23/19 1850)  furosemide (LASIX) injection 40 mg (40 mg Intravenous Given 07/23/19 1914)  ceFEPIme (MAXIPIME) 1 g in sodium chloride 0.9 % 100 mL IVPB (0 g Intravenous Stopped 07/23/19 2038)  HYDROmorphone (DILAUDID) injection 1 mg (1 mg Intravenous Given 07/23/19 2139)  iohexol (OMNIPAQUE) 300 MG/ML solution 80 mL (80 mLs Intravenous Contrast Given 07/23/19 2142)    ED Course  I have reviewed the triage vital signs and the nursing notes.  Pertinent labs & imaging results that were available during my care of the patient were reviewed by me and considered in my medical decision making (see chart for details).    MDM Rules/Calculators/A&P                          Abdulahad Mederos. is a 86 y.o. Yates here presenting with left-sided rib pain and abdominal pain and leg swelling. He had a fall about a week ago and had  progressively worsening symptoms. Had initial x-rays that were negative.  Consider internal pain versus rib fracture versus CHF exacerbation.  Plan to get CT head and chest abdomen pelvis.  We will also get CBC, CMP, BNP, troponin. Will also give IV Lasix.  10:25 PM Patient heart enzymes are normal.  His BNP is normal.  UA showed possible UTI he has suprapubic catheter so not sure which is contaminated.  INR is 2.2.  His chest abdomen pelvis showed minimally displaced left anterior eighth and ninth rib fractures.  There is no intra-abdominal bleeding.  Patient pain is controlled with pain medicine. Also given cefepime for possible catheter associated UTI.  I discussed admission for pain control versus change pain medicine to Percocet and patient would like to try Percocet at home.  Also discharged home with Keflex  Final Clinical Impression(s) / ED Diagnoses Final diagnoses:  1 minute Apgar score 0    Rx / DC Orders ED Discharge Orders    None       Drenda Freeze, MD 07/23/19 2227

## 2019-07-24 ENCOUNTER — Other Ambulatory Visit: Payer: Self-pay | Admitting: Family Medicine

## 2019-07-24 DIAGNOSIS — Z7901 Long term (current) use of anticoagulants: Secondary | ICD-10-CM

## 2019-07-27 ENCOUNTER — Ambulatory Visit: Payer: Medicare Other | Admitting: Family Medicine

## 2019-07-27 LAB — URINE CULTURE: Culture: >=100000 — AB

## 2019-07-29 ENCOUNTER — Telehealth: Payer: Self-pay | Admitting: Family Medicine

## 2019-07-29 ENCOUNTER — Telehealth: Payer: Self-pay | Admitting: *Deleted

## 2019-07-29 NOTE — Telephone Encounter (Signed)
Gary Yates with [name] is calling stating that they received a call from the son to come out to do hospice.  Gary Yates is calling to get verbal orders for Hospice to go out and wanted to know if Dr. Elease Hashimoto will be attending or do you want Hospice to take over.

## 2019-07-29 NOTE — Telephone Encounter (Signed)
Post ED Visit - Positive Culture Follow-up  Culture report reviewed by antimicrobial stewardship pharmacist: [facility] Team []  Elenor Quinones, Pharm.D. []  Heide Guile, Pharm.D., BCPS AQ-ID []  Parks Neptune, Pharm.D., BCPS []  Alycia Rossetti, Pharm.D., BCPS []  Stewart, Pharm.D., BCPS, AAHIVP []  Legrand Como, Pharm.D., BCPS, AAHIVP []  Salome Arnt, PharmD, BCPS []  Johnnette Gourd, PharmD, BCPS []  Hughes Better, PharmD, BCPS []  Leeroy Cha, PharmD []  Laqueta Linden, PharmD, BCPS []  Albertina Parr, PharmD  [facility] Team []  Leodis Sias, PharmD []  Lindell Spar, PharmD []  Royetta Asal, PharmD []  Graylin Shiver, Rph []  Rema Fendt) Glennon Mac, PharmD []  Arlyn Dunning, PharmD []  Netta Cedars, PharmD []  Dia Sitter, PharmD []  Leone Haven, PharmD []  Gretta Arab, PharmD []  Theodis Shove, PharmD []  Peggyann Juba, PharmD []  Reuel Boom, PharmD   Positive urine culture Treated with Cephalexin, organism sensitive to the same and no further patient follow-up is required at this time.  Haleuy von Edgardo Roys, PharmD  Harlon Flor Talley 07/29/2019, 10:52 AM

## 2019-07-29 NOTE — Telephone Encounter (Signed)
Please advise 

## 2019-07-30 NOTE — Telephone Encounter (Signed)
Spoke with Gary Yates. Verbal orders given for Hospice to go out. Also informed her that Dr. Elease Hashimoto is happy to be the attending if that's what the patient would like. Gary Yates verbalized understanding.

## 2019-07-30 NOTE — Telephone Encounter (Signed)
I am happy to help any way we can.

## 2019-08-02 ENCOUNTER — Encounter: Payer: Self-pay | Admitting: Family Medicine

## 2019-08-02 ENCOUNTER — Other Ambulatory Visit: Payer: Self-pay

## 2019-08-02 ENCOUNTER — Ambulatory Visit: Payer: Medicare Other | Admitting: Family Medicine

## 2019-08-02 VITALS — BP 118/50 | HR 95 | Temp 97.9°F | Wt 237.1 lb

## 2019-08-02 DIAGNOSIS — R600 Localized edema: Secondary | ICD-10-CM | POA: Diagnosis not present

## 2019-08-02 DIAGNOSIS — S2242XD Multiple fractures of ribs, left side, subsequent encounter for fracture with routine healing: Secondary | ICD-10-CM

## 2019-08-02 NOTE — Patient Instructions (Signed)
Increase the Lasix to 40 mg twice daily  Elevate legs frequently.  Weigh daily- same time of day and record.  Give me some follow up next week with weights.

## 2019-08-02 NOTE — Progress Notes (Signed)
Established Patient Office Visit  Subjective:  Patient ID: Gary Yates., male    DOB: Feb 11, 1933  Age: 86 y.o. MRN: [id number]  CC:  Chief Complaint  Patient presents with  . Follow-up    pt is here for a follow up from last visit     HPI Gary Yates. presents for follow-up from recent fall.  Refer to note.  He went to the ER on the fifth and had plain x-rays which showed no fracture.  We had clinical suspicion of fractures and he was prescribed hydrocodone.  He tried to take this sparingly.  He went back to the ER on the 16th and had CT scan which did confirm nondisplaced rib fractures of the left eighth and ninth ribs.  He was given incentive spirometer which he is using daily.  His pain is improving.  He has now taking only regular Tylenol.  Recent increased bilateral leg edema.  We started back Lasix 40 mg daily.  He increased this to 1-1/2 tablets but still not seeing much improvement.  Weight is up another 4 pounds.  He has not been as active since the fracture and thinks that may be contributing to the edema.  He also states high-dose gabapentin but has been on this for several years for neuropathy and that can be contributing as well.  Son had called requesting consideration for palliative care referral.  Both patient and patient's wife have been declining steadily recently.  There are 3 major areas of concern are food and meal preparation, medication administration, and assistance with ADLs.  They do have palliative care coming out Wednesday to discuss  Past Medical History:  Diagnosis Date  . Anemia in chronic renal disease 05/07/2017  . Anxiety   . Atrial fibrillation ([place])   . COPD (chronic obstructive pulmonary disease) ([place])    pt. denies  . Coronary artery disease    a. h/o Overlapping stents RCA;  b. 06/2011 Cath: patent stents, nonobs dzs, NL EF.  . Diabetic peripheral neuropathy ([place])   . Diffuse non-Hodgkin's lymphoma of testis ([place]) 09/28/2015  . DM (diabetes  mellitus) ([place])    Type 2, peripheral neuropathy.  . Dyspnea    with exertion  . Dysrhythmia   . GERD (gastroesophageal reflux disease)   . Headache   . History of bronchitis   . History of kidney stones   . History of radiation therapy 02/19/16 - 03/13/16   Testis/Scrotum: 32.4 Gy in 18 fractions  . History of radiation therapy 08/07/16-08/20/16   left adrenal gland mass treated to 30 Gy in 10 fractions  . Hyperlipidemia   . Hypertension   . Iron deficiency anemia due to chronic blood loss 08/08/2017  . Low testosterone   . Nephrolithiasis   . OSA (obstructive sleep apnea) 11/26/2017  . Osteoarthritis    shoulder  . Restless leg   . SVT (supraventricular tachycardia) ([place])   . Urinary frequency   . Wears partial dentures    upper and lower    Past Surgical History:  Procedure Laterality Date  . APPENDECTOMY    . [place]  . CARDIAC CATHETERIZATION  01/2013  . CATARACT EXTRACTION, BILATERAL    . CHOLECYSTECTOMY    . COLONOSCOPY    . CORONARY ANGIOPLASTY  2004  . CYSTOSCOPY N/A 08/18/2017   Procedure: CYSTOSCOPY WITH FULGURATION AND SUPRA PUBIC TUBE PLACEMENT;  Surgeon: Kathie Rhodes, MD;  Location: [facility];  Service: Urology;  Laterality:  N/A;  . EYE SURGERY Bilateral    cataracts  . IR GENERIC HISTORICAL  10/05/2015   IR US GUIDE VASC ACCESS RIGHT 10/05/2015 Marybelle Killings, MD [facility]  . IR GENERIC HISTORICAL  10/05/2015   IR FLUORO GUIDE PORT INSERTION RIGHT 10/05/2015 Marybelle Killings, MD [facility]  . LEFT HEART CATHETERIZATION WITH CORONARY ANGIOGRAM N/A 06/18/2011   Procedure: LEFT HEART CATHETERIZATION WITH CORONARY ANGIOGRAM;  Surgeon: Peter M Martinique, MD;  Location: [facility] CATH LAB;  Service: Cardiovascular;  Laterality: N/A;  . LEFT HEART CATHETERIZATION WITH CORONARY ANGIOGRAM N/A 01/27/2013   Procedure: LEFT HEART CATHETERIZATION WITH CORONARY ANGIOGRAM;  Surgeon: Burnell Blanks, MD;  Location: [facility] CATH LAB;  Service: Cardiovascular;  Laterality: N/A;    . LUMBAR LAMINECTOMY/DECOMPRESSION MICRODISCECTOMY N/A 02/07/2015   Procedure: Lumbar three-Sacral one Decompression;  Surgeon: Kevan Ny Ditty, MD;  Location: [place] [facility]URO ORS;  Service: Neurosurgery;  Laterality: N/A;  L3 to S1 Decompression  . MULTIPLE TOOTH EXTRACTIONS    . ORCHIECTOMY Right 09/01/2015   Procedure: RIGHT ORCHIECTOMY;  Surgeon: Kathie Rhodes, MD;  Location: [facility];  Service: Urology;  Laterality: Right;  . port a cath in place     . ROTATOR CUFF REPAIR Left     Family History  Problem Relation Age of Onset  . Alzheimer's disease Mother   . Heart disease Mother   . Heart disease Father   . Migraines Father   . Ulcers Father   . Prostate cancer Brother   . Coronary artery disease Other        Male 1st degree relative <50  . Coronary artery disease Other        male 1st degree relative <60  . Heart disease Sister   . Obesity Sister        Morbid  . Arthritis Sister   . Heart disease Brother   . Arthritis Brother   . Sleep apnea Son   . Obesity Son   . Migraines Daughter   . Thyroid disease Daughter     Social History   Socioeconomic History  . Marital status: Married    Spouse name: Not on file  . Number of children: 3  . Years of education: Not on file  . Highest education level: Not on file  Occupational History  . Occupation: Retired    Fish farm manager: RETIRED  Tobacco Use  . Smoking status: Former Smoker    Packs/day: 1.50    Years: 20.00    Pack years: 30.00    Types: Cigarettes    Quit date: 04/05/1978    Years since quitting: 41.3  . Smokeless tobacco: Never Used  Vaping Use  . Vaping Use: Never used  Substance and Sexual Activity  . Alcohol use: No  . Drug use: No  . Sexual activity: Yes    Birth control/protection: None    Comment: Married  Other Topics Concern  . Not on file  Social History Narrative  . Not on file   Social Determinants of Health   Financial Resource Strain:   . Difficulty of Paying Living Expenses:   Food  Insecurity:   . Worried About Charity fundraiser in the Last Year:   . Arboriculturist in the Last Year:   Transportation Needs:   . Film/video editor (Medical):   Marland Kitchen Lack of Transportation (Non-Medical):   Physical Activity:   . Days of Exercise per Week:   . Minutes of Exercise per Session:   Stress:   .  Feeling of Stress :   Social Connections:   . Frequency of Communication with Friends and Family:   . Frequency of Social Gatherings with Friends and Family:   . Attends Religious Services:   . Active Member of Clubs or Organizations:   . Attends Archivist Meetings:   Marland Kitchen Marital Status:   Intimate Partner Violence:   . Fear of Current or Ex-Partner:   . Emotionally Abused:   Marland Kitchen Physically Abused:   . Sexually Abused:     Outpatient Medications Prior to Visit  Medication Sig Dispense Refill  . acetaminophen (TYLENOL) 500 MG tablet Take 1,000 mg by mouth every 6 (six) hours as needed (for pain/fever/headaches.).     Marland Kitchen albuterol (VENTOLIN HFA) 108 (90 Base) MCG/ACT inhaler Inhale 2 puffs into the lungs every 4 (four) hours as needed for wheezing or shortness of breath. 18 g 1  . atorvastatin (LIPITOR) 40 MG tablet TAKE 1 TABLET BY MOUTH EVERY DAY (Patient taking differently: Take 40 mg by mouth daily. ) 30 tablet 2  . cephALEXin (KEFLEX) 250 MG capsule Take 1 capsule (250 mg total) by mouth 3 (three) times daily. 21 capsule 0  . docusate sodium (COLACE) 50 MG capsule Take 1 capsule (50 mg total) by mouth 2 (two) times daily as needed (for constipation). 90 capsule 0  . furosemide (LASIX) 40 MG tablet TAKE 1 TABLET BY MOUTH EVERY OTHER DAY. 45 tablet 0  . gabapentin (NEURONTIN) 300 MG capsule TAKE 2 CAPSULES BY MOUTH 3 TIMES A DAY (Patient taking differently: Take 900 mg by mouth 2 (two) times daily. ) 540 capsule 1  . glucose blood (ACCU-CHEK AVIVA PLUS) test strip 1 each by Other route 3 (three) times daily. for testing 300 each 1  . KLOR-CON M20 20 MEQ tablet TAKE 1  TABLET BY MOUTH EVERY DAY (Patient taking differently: Take 20 mEq by mouth in the morning. ) 30 tablet 1  . lidocaine-prilocaine (EMLA) cream Apply 1 application topically as needed. Apply to [facility] one hour before procedure. (Patient taking differently: Apply 1 application topically as needed (to PAC one hour before procedure.). ) 60 g 12  . Melatonin 1 MG TABS Take 1 tablet (1 mg total) by mouth at bedtime. 30 tablet 2  . metFORMIN (GLUCOPHAGE) 500 MG tablet TAKE 1 TABLET BY MOUTH TWICE A DAY WITH MEALS (Patient taking differently: Take 500 mg by mouth 2 (two) times daily with a meal. ) 180 tablet 1  . methylPREDNISolone (MEDROL DOSEPAK) 4 MG TBPK tablet As directed 21 tablet 0  . metoprolol succinate (TOPROL-XL) 25 MG 24 hr tablet Take 1 tablet (25 mg total) by mouth daily. 90 tablet 3  . nitroGLYCERIN (NITROSTAT) 0.4 MG SL tablet Place 1 tablet (0.4 mg total) under the tongue every 5 (five) minutes as needed. Chest pain (Patient taking differently: Place 0.4 mg under the tongue every 5 (five) minutes as needed for chest pain. ) 25 tablet 6  . nystatin (MYCOSTATIN/NYSTOP) powder APPLY TO AFFECTED AREA 4 TIMES A DAY (Patient taking differently: Apply 1 application topically 4 (four) times daily as needed (to affected areas). ) 60 g 0  . nystatin-triamcinolone ointment (MYCOLOG) Apply 1 application topically 2 (two) times daily. 15 g 0  . ondansetron (ZOFRAN ODT) 4 MG disintegrating tablet 6m ODT q4 hours prn nausea/vomit 20 tablet 0  . pantoprazole (PROTONIX) 40 MG tablet TAKE 1 TABLET BY MOUTH EVERY DAY (Patient taking differently: Take 40 mg by mouth in the morning. )  90 tablet 3  . polyethylene glycol (MIRALAX / GLYCOLAX) packet Take 17 g by mouth daily. 14 each 0  . pramipexole (MIRAPEX) 1.5 MG tablet TAKE 2 TABLETS BY MOUTH EVERY EVENING (Patient taking differently: Take 3 mg by mouth every evening. ) 180 tablet 1  . warfarin (COUMADIN) 5 MG tablet Take 1/2 tablet daily except take 1 tablet on Sun  Tues and Thurs or Take as directed by anticoagulation clinic 90 tablet 1  . HYDROcodone-acetaminophen (NORCO/VICODIN) 5-325 MG tablet Take 1 tablet by mouth every 6 (six) hours as needed for moderate pain. 20 tablet 0  . oxyCODONE-acetaminophen (PERCOCET) 5-325 MG tablet Take 1-2 tablets by mouth every 6 (six) hours as needed. 20 tablet 0   Facility-Administered Medications Prior to Visit  Medication Dose Route Frequency Provider Last Rate Last Admin  . acetaminophen (TYLENOL) tablet 650 mg  650 mg Oral Once Reader, Sarah M, NP      . pegfilgrastim (NEULASTA ONPRO KIT) injection 6 mg  6 mg Subcutaneous Once Volanda Napoleon, MD      . sodium chloride flush (NS) 0.9 % injection 10 mL  10 mL Intracatheter PRN Volanda Napoleon, MD   10 mL at 02/27/17 1503  . sodium chloride flush (NS) 0.9 % injection 10 mL  10 mL Intravenous PRN Volanda Napoleon, MD   10 mL at 05/07/17 0092  . sodium chloride flush (NS) 0.9 % injection 10 mL  10 mL Intravenous PRN Eliezer Bottom, NP   10 mL at 04/14/19 3300    Allergies  Allergen Reactions  . Latex Other (See Comments)    Reddens the skin  . Ace Inhibitors Cough  . Codeine Nausea Only and Rash       . Penicillins Rash    Childhood allergy Has patient had a PCN reaction causing immediate rash, facial/tongue/throat swelling, SOB or lightheadedness with hypotension: Yes Has patient had a PCN reaction causing severe rash involving mucus membranes or skin necrosis: Yes Has patient had a PCN reaction that required hospitalization No Has patient had a PCN reaction occurring within the last 10 years: No If all of the above answers are "NO", then may proceed with Cephalosporin use.     ROS Review of Systems  Constitutional: Negative for appetite change, chills and fever.  Respiratory: Negative for cough and shortness of breath.   Cardiovascular: Negative for chest pain.  Gastrointestinal: Negative for abdominal pain.  Psychiatric/Behavioral:  Negative for confusion.      Objective:    Physical Exam Vitals reviewed.  Constitutional:      Appearance: He is obese.  Cardiovascular:     Rate and Rhythm: Normal rate.  Pulmonary:     Effort: Pulmonary effort is normal.     Breath sounds: Normal breath sounds.  Musculoskeletal:     Right lower leg: Edema present.     Left lower leg: Edema present.     Comments: He has 2+ pitting edema legs bilaterally.  No ulcerations noted  Neurological:     Mental Status: He is alert.     BP (!) 118/50 (BP Location: Left Arm, Patient Position: Sitting, Cuff Size: Normal)   Pulse 95   Temp 97.9 F (36.6 C) (Oral)   Wt (!) 237 lb 1.6 oz (107.5 kg)   SpO2 93%   BMI 34.02 kg/m  Wt Readings from Last 3 Encounters:  08/02/19 (!) 237 lb 1.6 oz (107.5 kg)  07/19/19 232 lb 9.6 oz (105.5 kg)  07/12/19  227 lb (103 kg)     Health Maintenance Due  Topic Date Due  . COVID-19 Vaccine (1) Never done  . FOOT EXAM  08/06/2014  . OPHTHALMOLOGY EXAM  07/23/2019    There are no preventive care reminders to display for this patient.  Lab Results  Component Value Date   TSH 4.795 (H) 08/05/2018   Lab Results  Component Value Date   WBC 7.0 07/23/2019   HGB 9.5 (L) 07/23/2019   HCT 28.0 (L) 07/23/2019   MCV 98.7 07/23/2019   PLT 189 07/23/2019   Lab Results  Component Value Date   NA 140 07/23/2019   K 3.9 07/23/2019   CHLORIDE 104 12/14/2015   CO2 26 07/23/2019   GLUCOSE 143 (H) 07/23/2019   BUN 24 (H) 07/23/2019   CREATININE 1.80 (H) 07/23/2019   BILITOT 0.3 07/23/2019   ALKPHOS 92 07/23/2019   AST 13 (L) 07/23/2019   ALT 7 07/23/2019   PROT 6.9 07/23/2019   ALBUMIN 3.3 (L) 07/23/2019   CALCIUM 8.5 (L) 07/23/2019   ANIONGAP 10 07/23/2019   EGFR 76 (L) 12/14/2015   GFR 38.06 (L) 03/22/2019   Lab Results  Component Value Date   CHOL 166 03/22/2019   Lab Results  Component Value Date   HDL 29.80 (L) 03/22/2019   Lab Results  Component Value Date   LDLCALC 53  09/15/2017   Lab Results  Component Value Date   TRIG 373.0 (H) 03/22/2019   Lab Results  Component Value Date   CHOLHDL 6 03/22/2019   Lab Results  Component Value Date   HGBA1C 7.0 (H) 03/22/2019      Assessment & Plan:   #1 recent fall with evidence for minimally displaced left anterior eighth and ninth ribs.  Pain is gradually improving  -Continue Tylenol as needed -Continue incentive spirometer several times daily  #2 bilateral leg edema.  If anything this appears to be worse today and has had 4 pounds of weight gain.  His lungs are clear  -Elevate legs frequently -Increase furosemide to 40 mg twice daily -Recheck basic metabolic panel -Ambulate as much as possible -We recommend that they weigh daily and give [place] some feedback for weights in 1 week.  If not further responding at that point consider trial of torsemide  No orders of the defined types were placed in this encounter.   Follow-up: No follow-ups on file.    Carolann Littler, MD

## 2019-08-03 ENCOUNTER — Ambulatory Visit (INDEPENDENT_AMBULATORY_CARE_PROVIDER_SITE_OTHER): Payer: Medicare Other | Admitting: General Practice

## 2019-08-03 ENCOUNTER — Ambulatory Visit: Payer: Medicare Other | Admitting: Family Medicine

## 2019-08-03 DIAGNOSIS — Z7901 Long term (current) use of anticoagulants: Secondary | ICD-10-CM

## 2019-08-03 DIAGNOSIS — I480 Paroxysmal atrial fibrillation: Secondary | ICD-10-CM

## 2019-08-03 LAB — POCT INR: INR: 2.1 (ref 2.0–3.0)

## 2019-08-03 LAB — BASIC METABOLIC PANEL
BUN/Creatinine Ratio: 16 (calc) (ref 6–22)
BUN: 21 mg/dL (ref 7–25)
CO2: 26 mmol/L (ref 20–32)
Calcium: 8.6 mg/dL (ref 8.6–10.3)
Chloride: 102 mmol/L (ref 98–110)
Creat: 1.32 mg/dL — ABNORMAL HIGH (ref 0.70–1.11)
Glucose, Bld: 93 mg/dL (ref 65–99)
Potassium: 4 mmol/L (ref 3.5–5.3)
Sodium: 138 mmol/L (ref 135–146)

## 2019-08-03 NOTE — Patient Instructions (Signed)
Pre visit review using our clinic review tool, if applicable. No additional management support is needed unless otherwise documented below in the visit note.  Continue to take  5 mg daily except 2.5 mg on Mon, Wed and Fridays and Saturdays.  Re-check in 2 weeks.  Dosing instructions Kathlee Nations,  RN @ [facility] while in patient's home. [phone].  Dosing instructions also given to patient.

## 2019-08-03 NOTE — Progress Notes (Signed)
Medical screening examination/treatment/procedure(s) were performed by non-physician practitioner and as supervising physician I was immediately available for consultation/collaboration. I agree with above. Blandon Offerdahl, MD   

## 2019-08-04 ENCOUNTER — Other Ambulatory Visit: Payer: Self-pay | Admitting: *Deleted

## 2019-08-04 ENCOUNTER — Telehealth: Payer: Self-pay | Admitting: *Deleted

## 2019-08-04 DIAGNOSIS — Z789 Other specified health status: Secondary | ICD-10-CM

## 2019-08-04 DIAGNOSIS — I480 Paroxysmal atrial fibrillation: Secondary | ICD-10-CM

## 2019-08-04 NOTE — Telephone Encounter (Signed)
Patient had meeting with hospice. [name] would like a call back from PCP to discuss information from the meeting.

## 2019-08-04 NOTE — Addendum Note (Signed)
Addended by: Modena Morrow R on: 08/04/2019 02:17 PM   Modules accepted: Orders

## 2019-08-04 NOTE — Progress Notes (Signed)
Referral for social work orders placed by [place], [place], [place].

## 2019-08-04 NOTE — Telephone Encounter (Signed)
Orders placed.

## 2019-08-04 NOTE — Telephone Encounter (Signed)
Spoke with patient's son.  We would like to go ahead and place referral to [facility] social work consult.  Family is interested in looking at local resources to help him and his mom as their health status declines.   Can you place order to [facility] for social worker consult?

## 2019-08-05 ENCOUNTER — Other Ambulatory Visit: Payer: Self-pay | Admitting: Family Medicine

## 2019-08-07 ENCOUNTER — Inpatient Hospital Stay (HOSPITAL_COMMUNITY)
Admission: EM | Admit: 2019-08-07 | Discharge: 2019-08-11 | DRG: 309 | Disposition: A | Discharge status: Skilled Nursing Facility | Payer: Medicare Other | Attending: Internal Medicine | Admitting: Internal Medicine

## 2019-08-07 ENCOUNTER — Inpatient Hospital Stay (HOSPITAL_COMMUNITY): Payer: Medicare Other

## 2019-08-07 ENCOUNTER — Emergency Department (HOSPITAL_COMMUNITY): Payer: Medicare Other

## 2019-08-07 ENCOUNTER — Encounter (HOSPITAL_COMMUNITY): Payer: Self-pay | Admitting: Emergency Medicine

## 2019-08-07 DIAGNOSIS — Z8249 Family history of ischemic heart disease and other diseases of the circulatory system: Secondary | ICD-10-CM

## 2019-08-07 DIAGNOSIS — Z79899 Other long term (current) drug therapy: Secondary | ICD-10-CM

## 2019-08-07 DIAGNOSIS — I479 Paroxysmal tachycardia, unspecified: Secondary | ICD-10-CM | POA: Diagnosis not present

## 2019-08-07 DIAGNOSIS — I129 Hypertensive chronic kidney disease with stage 1 through stage 4 chronic kidney disease, or unspecified chronic kidney disease: Secondary | ICD-10-CM

## 2019-08-07 DIAGNOSIS — Z7902 Long term (current) use of antithrombotics/antiplatelets: Secondary | ICD-10-CM | POA: Diagnosis not present

## 2019-08-07 DIAGNOSIS — E782 Mixed hyperlipidemia: Secondary | ICD-10-CM | POA: Diagnosis present

## 2019-08-07 DIAGNOSIS — C8589 Other specified types of non-Hodgkin lymphoma, extranodal and solid organ sites: Secondary | ICD-10-CM | POA: Diagnosis not present

## 2019-08-07 DIAGNOSIS — Z8042 Family history of malignant neoplasm of prostate: Secondary | ICD-10-CM

## 2019-08-07 DIAGNOSIS — Z880 Allergy status to penicillin: Secondary | ICD-10-CM

## 2019-08-07 DIAGNOSIS — N1832 Chronic kidney disease, stage 3b: Secondary | ICD-10-CM | POA: Diagnosis present

## 2019-08-07 DIAGNOSIS — Z9841 Cataract extraction status, right eye: Secondary | ICD-10-CM

## 2019-08-07 DIAGNOSIS — I471 Supraventricular tachycardia: Secondary | ICD-10-CM

## 2019-08-07 DIAGNOSIS — M7989 Other specified soft tissue disorders: Secondary | ICD-10-CM | POA: Diagnosis not present

## 2019-08-07 DIAGNOSIS — Z20822 Contact with and (suspected) exposure to covid-19: Secondary | ICD-10-CM | POA: Diagnosis present

## 2019-08-07 DIAGNOSIS — G4733 Obstructive sleep apnea (adult) (pediatric): Secondary | ICD-10-CM | POA: Diagnosis present

## 2019-08-07 DIAGNOSIS — E1122 Type 2 diabetes mellitus with diabetic chronic kidney disease: Secondary | ICD-10-CM

## 2019-08-07 DIAGNOSIS — I499 Cardiac arrhythmia, unspecified: Secondary | ICD-10-CM | POA: Diagnosis not present

## 2019-08-07 DIAGNOSIS — I700 Atherosclerosis of aorta: Secondary | ICD-10-CM | POA: Diagnosis present

## 2019-08-07 DIAGNOSIS — Z820 Family history of epilepsy and other diseases of the nervous system: Secondary | ICD-10-CM

## 2019-08-07 DIAGNOSIS — Z6834 Body mass index (BMI) 34.0-34.9, adult: Secondary | ICD-10-CM

## 2019-08-07 DIAGNOSIS — N183 Chronic kidney disease, stage 3 unspecified: Secondary | ICD-10-CM

## 2019-08-07 DIAGNOSIS — D631 Anemia in chronic kidney disease: Secondary | ICD-10-CM | POA: Diagnosis present

## 2019-08-07 DIAGNOSIS — I10 Essential (primary) hypertension: Secondary | ICD-10-CM | POA: Diagnosis present

## 2019-08-07 DIAGNOSIS — I739 Peripheral vascular disease, unspecified: Secondary | ICD-10-CM | POA: Diagnosis not present

## 2019-08-07 DIAGNOSIS — E1140 Type 2 diabetes mellitus with diabetic neuropathy, unspecified: Secondary | ICD-10-CM | POA: Diagnosis present

## 2019-08-07 DIAGNOSIS — Z923 Personal history of irradiation: Secondary | ICD-10-CM

## 2019-08-07 DIAGNOSIS — Z7984 Long term (current) use of oral hypoglycemic drugs: Secondary | ICD-10-CM | POA: Diagnosis not present

## 2019-08-07 DIAGNOSIS — Z6835 Body mass index (BMI) 35.0-35.9, adult: Secondary | ICD-10-CM | POA: Diagnosis not present

## 2019-08-07 DIAGNOSIS — I2510 Atherosclerotic heart disease of native coronary artery without angina pectoris: Secondary | ICD-10-CM | POA: Diagnosis present

## 2019-08-07 DIAGNOSIS — N189 Chronic kidney disease, unspecified: Secondary | ICD-10-CM | POA: Diagnosis present

## 2019-08-07 DIAGNOSIS — T502X5A Adverse effect of carbonic-anhydrase inhibitors, benzothiadiazides and other diuretics, initial encounter: Secondary | ICD-10-CM | POA: Diagnosis present

## 2019-08-07 DIAGNOSIS — Z7901 Long term (current) use of anticoagulants: Secondary | ICD-10-CM | POA: Diagnosis not present

## 2019-08-07 DIAGNOSIS — J449 Chronic obstructive pulmonary disease, unspecified: Secondary | ICD-10-CM | POA: Diagnosis present

## 2019-08-07 DIAGNOSIS — R600 Localized edema: Secondary | ICD-10-CM

## 2019-08-07 DIAGNOSIS — N179 Acute kidney failure, unspecified: Secondary | ICD-10-CM | POA: Diagnosis present

## 2019-08-07 DIAGNOSIS — N1831 Chronic kidney disease, stage 3a: Secondary | ICD-10-CM | POA: Diagnosis not present

## 2019-08-07 DIAGNOSIS — G2581 Restless legs syndrome: Secondary | ICD-10-CM | POA: Diagnosis present

## 2019-08-07 DIAGNOSIS — Z885 Allergy status to narcotic agent status: Secondary | ICD-10-CM | POA: Diagnosis not present

## 2019-08-07 DIAGNOSIS — Z87891 Personal history of nicotine dependence: Secondary | ICD-10-CM

## 2019-08-07 DIAGNOSIS — E1142 Type 2 diabetes mellitus with diabetic polyneuropathy: Secondary | ICD-10-CM | POA: Diagnosis present

## 2019-08-07 DIAGNOSIS — K219 Gastro-esophageal reflux disease without esophagitis: Secondary | ICD-10-CM | POA: Diagnosis present

## 2019-08-07 DIAGNOSIS — Z8261 Family history of arthritis: Secondary | ICD-10-CM

## 2019-08-07 DIAGNOSIS — R0602 Shortness of breath: Secondary | ICD-10-CM | POA: Diagnosis not present

## 2019-08-07 DIAGNOSIS — I472 Ventricular tachycardia: Secondary | ICD-10-CM | POA: Diagnosis not present

## 2019-08-07 DIAGNOSIS — Z888 Allergy status to other drugs, medicaments and biological substances status: Secondary | ICD-10-CM

## 2019-08-07 DIAGNOSIS — I480 Paroxysmal atrial fibrillation: Secondary | ICD-10-CM | POA: Diagnosis present

## 2019-08-07 DIAGNOSIS — Z87442 Personal history of urinary calculi: Secondary | ICD-10-CM

## 2019-08-07 DIAGNOSIS — Z9221 Personal history of antineoplastic chemotherapy: Secondary | ICD-10-CM

## 2019-08-07 DIAGNOSIS — Z9842 Cataract extraction status, left eye: Secondary | ICD-10-CM

## 2019-08-07 LAB — CBC
HCT: 30.9 % — ABNORMAL LOW (ref 39.0–52.0)
Hemoglobin: 9.6 g/dL — ABNORMAL LOW (ref 13.0–17.0)
MCH: 30.6 pg (ref 26.0–34.0)
MCHC: 31.1 g/dL (ref 30.0–36.0)
MCV: 98.4 fL (ref 80.0–100.0)
Platelets: 170 10*3/uL (ref 150–400)
RBC: 3.14 MIL/uL — ABNORMAL LOW (ref 4.22–5.81)
RDW: 15.1 % (ref 11.5–15.5)
WBC: 6.4 10*3/uL (ref 4.0–10.5)
nRBC: 0 % (ref 0.0–0.2)

## 2019-08-07 LAB — HEPATIC FUNCTION PANEL
ALT: 8 U/L (ref 0–44)
AST: 12 U/L — ABNORMAL LOW (ref 15–41)
Albumin: 2.9 g/dL — ABNORMAL LOW (ref 3.5–5.0)
Alkaline Phosphatase: 76 U/L (ref 38–126)
Bilirubin, Direct: <0.1 mg/dL (ref 0.0–0.2)
Total Bilirubin: 0.5 mg/dL (ref 0.3–1.2)
Total Protein: 6.4 g/dL — ABNORMAL LOW (ref 6.5–8.1)

## 2019-08-07 LAB — URINALYSIS, ROUTINE W REFLEX MICROSCOPIC
Bacteria, UA: NONE SEEN
Bilirubin Urine: NEGATIVE
Glucose, UA: NEGATIVE mg/dL
Ketones, ur: NEGATIVE mg/dL
Nitrite: NEGATIVE
Protein, ur: 100 mg/dL — AB
Specific Gravity, Urine: 1.016 (ref 1.005–1.030)
pH: 5 (ref 5.0–8.0)

## 2019-08-07 LAB — SARS CORONAVIRUS 2 BY RT PCR (HOSPITAL ORDER, PERFORMED IN ~~LOC~~ HOSPITAL LAB): SARS Coronavirus 2: NEGATIVE

## 2019-08-07 LAB — BASIC METABOLIC PANEL
Anion gap: 12 (ref 5–15)
BUN: 24 mg/dL — ABNORMAL HIGH (ref 8–23)
CO2: 24 mmol/L (ref 22–32)
Calcium: 8.4 mg/dL — ABNORMAL LOW (ref 8.9–10.3)
Chloride: 99 mmol/L (ref 98–111)
Creatinine, Ser: 1.88 mg/dL — ABNORMAL HIGH (ref 0.61–1.24)
GFR calc Af Amer: 37 mL/min — ABNORMAL LOW (ref >60)
GFR calc non Af Amer: 32 mL/min — ABNORMAL LOW (ref >60)
Glucose, Bld: 164 mg/dL — ABNORMAL HIGH (ref 70–99)
Potassium: 3.6 mmol/L (ref 3.5–5.1)
Sodium: 135 mmol/L (ref 135–145)

## 2019-08-07 LAB — CBG MONITORING, ED: Glucose-Capillary: 133 mg/dL — ABNORMAL HIGH (ref 70–99)

## 2019-08-07 LAB — BRAIN NATRIURETIC PEPTIDE: B Natriuretic Peptide: 72.5 pg/mL (ref 0.0–100.0)

## 2019-08-07 LAB — MAGNESIUM: Magnesium: 1.2 mg/dL — ABNORMAL LOW (ref 1.7–2.4)

## 2019-08-07 LAB — TROPONIN I (HIGH SENSITIVITY)
Troponin I (High Sensitivity): 7 ng/L (ref <18)
Troponin I (High Sensitivity): 9 ng/L (ref <18)

## 2019-08-07 LAB — PROTIME-INR
INR: 1.7 — ABNORMAL HIGH (ref 0.8–1.2)
Prothrombin Time: 19.5 seconds — ABNORMAL HIGH (ref 11.4–15.2)

## 2019-08-07 LAB — MRSA PCR SCREENING: MRSA by PCR: NEGATIVE

## 2019-08-07 LAB — GLUCOSE, CAPILLARY: Glucose-Capillary: 142 mg/dL — ABNORMAL HIGH (ref 70–99)

## 2019-08-07 MED ORDER — SODIUM CHLORIDE 0.9% FLUSH: 3.0000 mL | Freq: Once | INTRAVENOUS | Status: DC

## 2019-08-07 MED ORDER — POTASSIUM CHLORIDE CRYS ER 20 MEQ PO TBCR: 20.0000 meq | EXTENDED_RELEASE_TABLET | Freq: Every morning | ORAL | Status: DC

## 2019-08-07 MED ORDER — SODIUM CHLORIDE 0.9% FLUSH
3.0000 mL | INTRAVENOUS | Status: DC | PRN
  Administered 2019-08-07: 3 mL via INTRAVENOUS

## 2019-08-07 MED ORDER — SODIUM CHLORIDE 0.9 % IV BOLUS: 1000.0000 mL | Freq: Once | INTRAVENOUS | Status: DC

## 2019-08-07 MED ORDER — PROMETHAZINE HCL 25 MG PO TABS: 12.5000 mg | ORAL_TABLET | Freq: Four times a day (QID) | ORAL | Status: DC | PRN

## 2019-08-07 MED ORDER — ATORVASTATIN CALCIUM 40 MG PO TABS
40.0000 mg | ORAL_TABLET | Freq: Every day | ORAL | Status: DC
  Administered 2019-08-07 – 2019-08-11 (×5): 40 mg via ORAL
  Filled 2019-08-07 (×5): qty 1

## 2019-08-07 MED ORDER — POLYETHYLENE GLYCOL 3350 17 G PO PACK
17.0000 g | PACK | Freq: Every day | ORAL | Status: DC
  Filled 2019-08-07 (×2): qty 1

## 2019-08-07 MED ORDER — ACETAMINOPHEN 500 MG PO TABS
1000.0000 mg | ORAL_TABLET | Freq: Four times a day (QID) | ORAL | Status: DC | PRN
  Administered 2019-08-08 – 2019-08-09 (×4): 1000 mg via ORAL
  Filled 2019-08-07 (×4): qty 2

## 2019-08-07 MED ORDER — INSULIN ASPART 100 UNIT/ML ~~LOC~~ SOLN
0.0000 [IU] | Freq: Three times a day (TID) | SUBCUTANEOUS | Status: DC
  Administered 2019-08-08 (×2): 2 [IU] via SUBCUTANEOUS
  Administered 2019-08-08: 3 [IU] via SUBCUTANEOUS
  Administered 2019-08-09 – 2019-08-11 (×5): 2 [IU] via SUBCUTANEOUS
  Administered 2019-08-11 (×2): 3 [IU] via SUBCUTANEOUS

## 2019-08-07 MED ORDER — WARFARIN SODIUM 2.5 MG PO TABS
2.5000 mg | ORAL_TABLET | ORAL | Status: DC
  Administered 2019-08-07: 2.5 mg via ORAL
  Filled 2019-08-07: qty 1

## 2019-08-07 MED ORDER — MELATONIN 1 MG PO TABS
1.0000 mg | ORAL_TABLET | Freq: Every day | ORAL | Status: DC
  Filled 2019-08-07: qty 1

## 2019-08-07 MED ORDER — ADENOSINE 6 MG/2ML IV SOLN
6.0000 mg | Freq: Once | INTRAVENOUS | Status: AC
  Administered 2019-08-07: 6 mg via INTRAVENOUS

## 2019-08-07 MED ORDER — SODIUM CHLORIDE 0.9% FLUSH
3.0000 mL | Freq: Two times a day (BID) | INTRAVENOUS | Status: DC
  Administered 2019-08-07 – 2019-08-10 (×7): 3 mL via INTRAVENOUS

## 2019-08-07 MED ORDER — METOPROLOL TARTRATE 5 MG/5ML IV SOLN
5.0000 mg | Freq: Four times a day (QID) | INTRAVENOUS | Status: DC | PRN
  Administered 2019-08-08: 5 mg via INTRAVENOUS
  Filled 2019-08-07: qty 5

## 2019-08-07 MED ORDER — MELATONIN 3 MG PO TABS
1.5000 mg | ORAL_TABLET | Freq: Every day | ORAL | Status: DC
  Administered 2019-08-07 – 2019-08-10 (×4): 1.5 mg via ORAL
  Filled 2019-08-07 (×4): qty 1

## 2019-08-07 MED ORDER — PRAMIPEXOLE DIHYDROCHLORIDE 1.5 MG PO TABS
3.0000 mg | ORAL_TABLET | Freq: Every evening | ORAL | Status: DC
  Administered 2019-08-07 – 2019-08-10 (×4): 3 mg via ORAL
  Filled 2019-08-07 (×5): qty 2

## 2019-08-07 MED ORDER — METOPROLOL SUCCINATE ER 25 MG PO TB24
25.0000 mg | ORAL_TABLET | Freq: Every day | ORAL | Status: DC
  Administered 2019-08-08 – 2019-08-11 (×3): 25 mg via ORAL
  Filled 2019-08-07 (×5): qty 1

## 2019-08-07 MED ORDER — MAGNESIUM SULFATE 2 GM/50ML IV SOLN
2.0000 g | Freq: Once | INTRAVENOUS | Status: AC
  Administered 2019-08-07: 2 g via INTRAVENOUS
  Filled 2019-08-07: qty 50

## 2019-08-07 MED ORDER — WARFARIN SODIUM 5 MG PO TABS: 5.0000 mg | ORAL_TABLET | ORAL | Status: DC

## 2019-08-07 MED ORDER — NITROGLYCERIN 0.4 MG SL SUBL: 0.4000 mg | SUBLINGUAL_TABLET | SUBLINGUAL | Status: DC | PRN

## 2019-08-07 MED ORDER — PANTOPRAZOLE SODIUM 40 MG PO TBEC
40.0000 mg | DELAYED_RELEASE_TABLET | Freq: Every morning | ORAL | Status: DC
  Administered 2019-08-08 – 2019-08-11 (×4): 40 mg via ORAL
  Filled 2019-08-07 (×4): qty 1

## 2019-08-07 MED ORDER — GABAPENTIN 300 MG PO CAPS
600.0000 mg | ORAL_CAPSULE | Freq: Three times a day (TID) | ORAL | Status: DC
  Administered 2019-08-07 – 2019-08-11 (×12): 600 mg via ORAL
  Filled 2019-08-07 (×12): qty 2

## 2019-08-07 MED ORDER — ALBUTEROL SULFATE (2.5 MG/3ML) 0.083% IN NEBU: 3.0000 mL | INHALATION_SOLUTION | RESPIRATORY_TRACT | Status: DC | PRN

## 2019-08-07 MED ORDER — WARFARIN SODIUM 5 MG PO TABS: 5.0000 mg | ORAL_TABLET | Freq: Every day | ORAL | Status: DC

## 2019-08-07 MED ORDER — WARFARIN - PHYSICIAN DOSING INPATIENT: Freq: Every day | Status: DC

## 2019-08-07 MED ORDER — DOCUSATE SODIUM 100 MG PO CAPS: 100.0000 mg | ORAL_CAPSULE | Freq: Two times a day (BID) | ORAL | Status: DC | PRN

## 2019-08-07 MED ORDER — SODIUM CHLORIDE 0.9 % IV SOLN: 250.0000 mL | INTRAVENOUS | Status: DC | PRN

## 2019-08-07 NOTE — H&P (Signed)
History and Physical    Gary Yates. BPZ:[id number] DOB: 05/10/1933 DOA: 08/07/2019  PCP: [name], MD  Patient coming from: Home  I have personally briefly reviewed patient's old medical records in [place]  Chief Complaint: Weakness  HPI: Gary Yates. is a 86 y.o. male with medical history significant of paroxysmal A. fib on Coumadin, COPD, CAD, non-Hodgkin's lymphoma in remission, hyperlipidemia, hypertension, coronary artery disease, obstructive sleep apnea, chronic renal insufficiency stage III diabetes mellitus type 2 who is status post fall approximately 2 weeks ago with left rib fractures and then has fallen twice more over the last few days.  He has had increasing weakness and fatigue as well as lower extremity edema.  He was seen by his PCP earlier this week and his Lasix was increased due to lower extremity swelling and some shortness of breath.  He reports his Lasix has not been working.  His last echo was in 2019 showed EF of 55 to 60%.  He was noted increasing spasms and shaking in his lower extremities and inability to really bear weight.  So he called EMS.  He was found to be in a weird tachyarrhythmia during that time.  ED Course: In the ED he was noted to be with a thought was SVT.  His blood pressures were very soft during that time at 88/62 with a pulse in the 170 range.  Cardiology was consulted he was given adenosine and converted to sinus rhythm.  He did not appear to be in frank congestive heart failure.  His chest x-ray was clear.  Troponins were negative, BMP was normal.  He has chronic anemia and his hemoglobin was stable at 9.4.  He had acute on chronic renal insufficiency with a creatinine of 1.88 thought to be related to overdiuresis.  He was noted to have a low magnesium which was repleted in the ED.  He also had significant lower extremity edema with an unclear etiology.  Cardiology saw the patient and asked for admission for further  telemetry plus or minus additional antiarrhythmic versus catheter ablation.  Also possible right heart cath if volume status remains unclear.  Review of Systems: As per HPI otherwise 10 point review of systems negative.   Past Medical History:  Diagnosis Date  . Anemia in chronic renal disease 05/07/2017  . Anxiety   . Atrial fibrillation ([place])   . COPD (chronic obstructive pulmonary disease) ([place])    pt. denies  . Coronary artery disease    a. h/o Overlapping stents RCA;  b. 06/2011 Cath: patent stents, nonobs dzs, NL EF.  . Diabetic peripheral neuropathy ([place])   . Diffuse non-Hodgkin's lymphoma of testis ([place]) 09/28/2015  . DM (diabetes mellitus) ([place])    Type 2, peripheral neuropathy.  . Dyspnea    with exertion  . Dysrhythmia   . GERD (gastroesophageal reflux disease)   . Headache   . History of bronchitis   . History of kidney stones   . History of radiation therapy 02/19/16 - 03/13/16   Testis/Scrotum: 32.4 Gy in 18 fractions  . History of radiation therapy 08/07/16-08/20/16   left adrenal gland mass treated to 30 Gy in 10 fractions  . Hyperlipidemia   . Hypertension   . Iron deficiency anemia due to chronic blood loss 08/08/2017  . Low testosterone   . Nephrolithiasis   . OSA (obstructive sleep apnea) 11/26/2017  . Osteoarthritis    shoulder  . Restless leg   .  SVT (supraventricular tachycardia) ([place])   . Urinary frequency   . Wears partial dentures    upper and lower    Past Surgical History:  Procedure Laterality Date  . APPENDECTOMY    . [place]  . CARDIAC CATHETERIZATION  01/2013  . CATARACT EXTRACTION, BILATERAL    . CHOLECYSTECTOMY    . COLONOSCOPY    . CORONARY ANGIOPLASTY  2004  . CYSTOSCOPY N/A 08/18/2017   Procedure: CYSTOSCOPY WITH FULGURATION AND SUPRA PUBIC TUBE PLACEMENT;  Surgeon: Kathie Rhodes, MD;  Location: [facility];  Service: Urology;  Laterality: N/A;  . EYE SURGERY Bilateral    cataracts  . IR GENERIC HISTORICAL  10/05/2015   IR US  GUIDE VASC ACCESS RIGHT 10/05/2015 Marybelle Killings, MD [facility]  . IR GENERIC HISTORICAL  10/05/2015   IR FLUORO GUIDE PORT INSERTION RIGHT 10/05/2015 Marybelle Killings, MD [facility]  . LEFT HEART CATHETERIZATION WITH CORONARY ANGIOGRAM N/A 06/18/2011   Procedure: LEFT HEART CATHETERIZATION WITH CORONARY ANGIOGRAM;  Surgeon: Peter M Martinique, MD;  Location: [facility] CATH LAB;  Service: Cardiovascular;  Laterality: N/A;  . LEFT HEART CATHETERIZATION WITH CORONARY ANGIOGRAM N/A 01/27/2013   Procedure: LEFT HEART CATHETERIZATION WITH CORONARY ANGIOGRAM;  Surgeon: Burnell Blanks, MD;  Location: [facility] CATH LAB;  Service: Cardiovascular;  Laterality: N/A;  . LUMBAR LAMINECTOMY/DECOMPRESSION MICRODISCECTOMY N/A 02/07/2015   Procedure: Lumbar three-Sacral one Decompression;  Surgeon: Kevan Ny Ditty, MD;  Location: [facility]URO ORS;  Service: Neurosurgery;  Laterality: N/A;  L3 to S1 Decompression  . MULTIPLE TOOTH EXTRACTIONS    . ORCHIECTOMY Right 09/01/2015   Procedure: RIGHT ORCHIECTOMY;  Surgeon: Kathie Rhodes, MD;  Location: [facility];  Service: Urology;  Laterality: Right;  . port a cath in place     . ROTATOR CUFF REPAIR Left      reports that he quit smoking about 41 years ago. His smoking use included cigarettes. He has a 30.00 pack-year smoking history. He has never used smokeless tobacco. He reports that he does not drink alcohol and does not use drugs.  Allergies  Allergen Reactions  . Latex Other (See Comments)    Reddens the skin  . Ace Inhibitors Cough  . Codeine Nausea Only and Rash       . Penicillins Rash    Childhood allergy Has patient had a PCN reaction causing immediate rash, facial/tongue/throat swelling, SOB or lightheadedness with hypotension: Yes Has patient had a PCN reaction causing severe rash involving mucus membranes or skin necrosis: Yes Has patient had a PCN reaction that required hospitalization No Has patient had a PCN reaction occurring within the last 10 years: No If  all of the above answers are "NO", then may proceed with Cephalosporin use.     Family History  Problem Relation Age of Onset  . Alzheimer's disease Mother   . Heart disease Mother   . Heart disease Father   . Migraines Father   . Ulcers Father   . Prostate cancer Brother   . Coronary artery disease Other        Male 1st degree relative <50  . Coronary artery disease Other        male 1st degree relative <60  . Heart disease Sister   . Obesity Sister        Morbid  . Arthritis Sister   . Heart disease Brother   . Arthritis Brother   . Sleep apnea Son   . Obesity Son   .  Migraines Daughter   . Thyroid disease Daughter      Prior to Admission medications   Medication Sig Start Date End Date Taking? Authorizing Provider  acetaminophen (TYLENOL) 500 MG tablet Take 1,000 mg by mouth every 6 (six) hours as needed (for pain/fever/headaches.).     [provider]  albuterol (VENTOLIN HFA) 108 (90 Base) MCG/ACT inhaler Inhale 2 puffs into the lungs every 4 (four) hours as needed for wheezing or shortness of breath. 05/24/19   Laurey Morale, MD  atorvastatin (LIPITOR) 40 MG tablet TAKE 1 TABLET BY MOUTH EVERY DAY Patient taking differently: Take 40 mg by mouth daily.  06/08/19   [name], Alinda Sierras, MD  cephALEXin (KEFLEX) 250 MG capsule Take 1 capsule (250 mg total) by mouth 3 (three) times daily. 07/23/19   Drenda Freeze, MD  docusate sodium (COLACE) 50 MG capsule Take 1 capsule (50 mg total) by mouth 2 (two) times daily as needed (for constipation). 04/22/16   [name], Alinda Sierras, MD  furosemide (LASIX) 40 MG tablet TAKE 1 TABLET BY MOUTH EVERY OTHER DAY. 07/26/19   [name], Alinda Sierras, MD  gabapentin (NEURONTIN) 300 MG capsule TAKE 2 CAPSULES BY MOUTH 3 TIMES A DAY 08/06/19   [name], Alinda Sierras, MD  glucose blood (ACCU-CHEK AVIVA PLUS) test strip 1 each by Other route 3 (three) times daily. for testing 08/25/18   [name], Alinda Sierras, MD  KLOR-CON M20 20 MEQ tablet TAKE 1 TABLET  BY MOUTH EVERY DAY Patient taking differently: Take 20 mEq by mouth in the morning.  07/19/19   [name], Alinda Sierras, MD  lidocaine-prilocaine (EMLA) cream Apply 1 application topically as needed. Apply to [facility] one hour before procedure. Patient taking differently: Apply 1 application topically as needed (to PAC one hour before procedure.).  06/29/19   Volanda Napoleon, MD  Melatonin 1 MG TABS Take 1 tablet (1 mg total) by mouth at bedtime. 08/14/18   [name], Alinda Sierras, MD  metFORMIN (GLUCOPHAGE) 500 MG tablet TAKE 1 TABLET BY MOUTH TWICE A DAY WITH MEALS 08/06/19   [name], Alinda Sierras, MD  methylPREDNISolone (MEDROL DOSEPAK) 4 MG TBPK tablet As directed 05/24/19   Laurey Morale, MD  metoprolol succinate (TOPROL-XL) 25 MG 24 hr tablet Take 1 tablet (25 mg total) by mouth daily. 11/24/18   Burnell Blanks, MD  nitroGLYCERIN (NITROSTAT) 0.4 MG SL tablet Place 1 tablet (0.4 mg total) under the tongue every 5 (five) minutes as needed. Chest pain Patient taking differently: Place 0.4 mg under the tongue every 5 (five) minutes as needed for chest pain.  01/20/14   Burnell Blanks, MD  nystatin (MYCOSTATIN/NYSTOP) powder APPLY TO AFFECTED AREA 4 TIMES A DAY Patient taking differently: Apply 1 application topically 4 (four) times daily as needed (to affected areas).  04/03/19   [name], Alinda Sierras, MD  nystatin-triamcinolone ointment (MYCOLOG) Apply 1 application topically 2 (two) times daily. 12/26/17   [name], Alinda Sierras, MD  ondansetron (ZOFRAN ODT) 4 MG disintegrating tablet 4mg  ODT q4 hours prn nausea/vomit 07/23/19   Drenda Freeze, MD  pantoprazole (PROTONIX) 40 MG tablet TAKE 1 TABLET BY MOUTH EVERY DAY Patient taking differently: Take 40 mg by mouth in the morning.  11/30/18   [name], Alinda Sierras, MD  polyethylene glycol (MIRALAX / Floria Raveling) packet Take 17 g by mouth daily. 06/20/17   Patrecia Pour, Christean Grief, MD  pramipexole (MIRAPEX) 1.5 MG tablet TAKE 2 TABLETS BY MOUTH EVERY EVENING Patient  taking differently: Take 3 mg  by mouth every evening.  04/19/19   [name], Alinda Sierras, MD  warfarin (COUMADIN) 5 MG tablet Take 1/2 tablet daily except take 1 tablet on Sun Tues and Thurs or Take as directed by anticoagulation clinic 07/26/19   [name], MD  metoprolol (LOPRESSOR) 50 MG tablet  06/08/10 03/27/11  [provider]  spironolactone (ALDACTONE) 25 MG tablet  01/14/11 03/27/11  [provider]    Physical Exam: Vitals:   08/07/19 1845 08/07/19 1945 08/07/19 2027 08/07/19 2038  BP: (!) 100/60 (!) 102/58 (!) 118/55 (!) 114/63  Pulse: 95 86 92 95  Resp: 13 12 15 17   Temp:   97.8 F (36.6 C)   TempSrc:   Oral   SpO2: 95% 99% 98%   Weight:   (!) 104.5 kg   Height:   5\' 9"  (1.753 m)     Constitutional: NAD, calm, comfortable Eyes: PERRL, lids and conjunctivae normal ENMT: Mucous membranes are moist. Posterior pharynx clear of any exudate or lesions.  Neck: normal, supple, no masses, no thyromegaly  Respiratory: clear to auscultation bilaterally, no wheezing, no crackles. Normal respiratory effort. No accessory muscle use.  Cardiovascular: Regular rate and rhythm, no murmurs / rubs / gallops.  4+ extremity edema. 1+ pedal pulses. Media Information     Abdomen: no tenderness, no masses palpated. No hepatosplenomegaly. Bowel sounds positive.  Musculoskeletal: no clubbing / cyanosis. No joint deformity   Skin: no rashes, lesions, ulcers.  Lower extremities with warmth and erythema no induration Neurologic: CN 2-12 grossly intact. Sensation intact, DTR normal.  Psychiatric: Normal judgment and insight. Alert and oriented x 3. Normal mood.   Labs on Admission: I have personally reviewed following labs and imaging studies  CBC: Recent Labs  Lab 08/07/19 1121  WBC 6.4  HGB 9.6*  HCT 30.9*  MCV 98.4  PLT 767   Basic Metabolic Panel: Recent Labs  Lab 08/02/19 1354 08/07/19 1121 08/07/19 1642  NA 138 135  --   K 4.0 3.6  --   CL 102 99  --   CO2 26  24  --   GLUCOSE 93 164*  --   BUN 21 24*  --   CREATININE 1.32* 1.88*  --   CALCIUM 8.6 8.4*  --   MG  --   --  1.2*   GFR: Estimated Creatinine Clearance: 34.8 mL/min (A) (by C-G formula based on SCr of 1.88 mg/dL (H)). Liver Function Tests: Recent Labs  Lab 08/07/19 1817  AST 12*  ALT 8  ALKPHOS 76  BILITOT 0.5  PROT 6.4*  ALBUMIN 2.9*   Coagulation Profile: Recent Labs  Lab 08/03/19 0000 08/07/19 1642  INR 2.1 1.7*   CBG: Recent Labs  Lab 08/07/19 1618  GLUCAP 133*   Urine analysis:    Component Value Date/Time   COLORURINE YELLOW 08/07/2019 1837   APPEARANCEUR HAZY (A) 08/07/2019 1837   LABSPEC 1.016 08/07/2019 1837   PHURINE 5.0 08/07/2019 1837   GLUCOSEU NEGATIVE 08/07/2019 1837   HGBUR MODERATE (A) 08/07/2019 1837   HGBUR trace-intact 12/29/2006 0827   BILIRUBINUR NEGATIVE 08/07/2019 [place] 08/07/2019 1837   PROTEINUR 100 (A) 08/07/2019 1837   UROBILINOGEN 0.2 12/29/2006 0827   NITRITE NEGATIVE 08/07/2019 1837   LEUKOCYTESUR MODERATE (A) 08/07/2019 1837    Radiological Exams on Admission: DG Chest Portable 1 View  Result Date: 08/07/2019 CLINICAL DATA:  Shortness of breath, weakness and tremors EXAM: PORTABLE CHEST 1 VIEW COMPARISON:  Radiograph 07/12/2019, CT  07/23/2019 FINDINGS: Nondisplaced lateral eighth rib fracture, possibly acute to subacute with history of recent fall. No other acute or suspicious osseous or soft tissue abnormality. Degenerative changes are present in the imaged spine and shoulders. Atelectatic changes are present in the left lung base with more chronic elevation of the left hemidiaphragm similar to priors. Chronically coarsened interstitial changes are similar to comparison exams. No consolidation, features of edema, pneumothorax, or effusion. Stable cardiomediastinal contours with a borderline enlarged cardiac silhouette and a calcified, tortuous aorta. Right IJ approach Port-A-Cath tip terminates in the lower  SVC. Telemetry leads and pacer pads overlie the chest. IMPRESSION: 1. Nondisplaced lateral eighth rib fracture, possibly acute to subacute with history of recent fall. 2. Chronically coarsened interstitial changes and left basilar atelectasis. 3. No other acute cardiopulmonary abnormalities. 4. Stable cardiomegaly. 5.  Aortic Atherosclerosis (ICD10-I70.0). Electronically Signed   By: Lovena Le M.D.   On: 08/07/2019 16:43    EKG: Independently reviewed.  Sinus tachycardia, right bundle branch block, left anterior fascicular block, lateral infarct age undetermined.  Assessment/Plan Principal Problem:   Cardiac arrhythmia Active Problems:   Hyperlipidemia, mixed   Morbid obesity (HCC)   Essential hypertension   Coronary atherosclerosis   G E R D   Diabetic peripheral neuropathy (HCC)   Paroxysmal atrial fibrillation ([place])   Long term current use of anticoagulant therapy   Type 2 diabetes mellitus with diabetic neuropathy, unspecified (HCC)   Diffuse non-Hodgkin's lymphoma of testis (HCC)   Bilateral lower extremity edema   Anemia in chronic renal disease   OSA (obstructive sleep apnea)   SVT (supraventricular tachycardia) (HCC)   Hypomagnesemia   Aortic atherosclerosis (HCC)  Cardiac arrhythmia Currently in sinus Cardiology on board Admit for telemetry Cardiac echo Replete magnesium Toprol 25 mg daily  History of paroxysmal atrial fibrillation on Coumadin Will continue warfarin at current dose INR was 1.7 today but previously in range  Lower extremity edema Holding diuretics for now  Acute on chronic renal insufficiency stage III Likely secondary to diuresis Hold diuretics, trend creatinine If volume status remains unclear consider right heart cath per cardiology  Hyperlipidemia Lipitor 40 mg daily  Hypertension Toprol 25 mg daily  Coronary artery disease No chest pain at present Negative troponins Cardiac echo pending  Diabetes On Metformin at home, on hold  due to kidney function and acute hospitalization Sliding scale insulin Diabetic diet  GERD Protonix 40 mg daily  Diabetic neuropathy Neurontin 600 mg twice daily  History of non-Hodgkin's lymphoma in remission Last follow-up with Dr. Marin Olp in May 2021  Anemia of chronic disease Hemoglobin is stable at 9.4 Anemia panel pending Trend hemoglobin  Hypomagnesemia Status post 2 g of mag sulfate in the ED Trend  Obstructive sleep apnea  Morbid obesity Could benefit from healthy lifestyle and weight loss.   DVT prophylaxis: [id number] Code Status: Full code confirmed with patient Family Communication: Patient at bedside Disposition Plan: Home with home health services likely Consults called: Cardiology Admission status: Inpatient due to continued cardiac monitoring needs.   Donnamae Jude MD [facility]  If 7PM-7AM, please contact night-coverage 08/07/2019, 8:41 PM

## 2019-08-07 NOTE — ED Notes (Signed)
Gary Yates would like an update she's speaking for the family  [phone]

## 2019-08-07 NOTE — Consult Note (Signed)
Cardiology Consultation:   Patient ID: Gary Yates.; [id number]; November 29, 1933   Admit date: 08/07/2019 Date of Consult: 08/07/2019  Primary Care Provider: [name], MD Primary Cardiologist: Lauree Chandler, MD Primary Electrophysiologist:  None  Chief Complaint: presyncope  Patient Profile:   Gary Yates. is a 86 y.o. male with a hx of HTN, HLD, obesity, DM, CAD s/p PCI 2004, PAF on warfarin, COPD who presents with presyncope and leg twitching.  History of Present Illness:   Gary Yates reports his main complaint is leg twitching and intermittent episodes when he feels like he might pass out.  These typically last a few minutes or less.  He had an episode like this today where he felt like he might pass out and therefore called EMS.  He was brought to the ED and had intermittent episodes of SVT in the 160s.  Eventually had a sustained episode and was given adenosine with termination.    He also notes significant lower extremity edema for about the past 2 weeks.  His PCP increased his Lasix, and he reports urinating more in response to that but this did not help the edema.  He denies dyspnea, orthopnea, or weight gain.  His chest x-ray in the ED is read as clear without any pulmonary edema.  He denies any chest pain or palpitations.  As noted above, he does not have dyspnea.  Cardiac history  Stress myoview 01/13/13 with small inferior wall defect from base to apex with mild peri-infarct ischemia. LVEF=53%. Cardiac cath 01/27/13 with stable disease. Admitted to [facility] 02/03/13 with chest pain. Mild elevation troponin. Started on Imdur and Plavix. Carotid dopplers with mild bilateral disease. He has been diagnosed with non-Hodgkins lymphoma treated with chemotherapy. He had involvement of his adrenal gland and he had radiation therapy. He has had testicular surgery followed by radiation. Echo June 2019 with LVEF=55-60%. Mild aortic stenosis (mean gradient 8 mmHg). He has been in  remission since February 2019. His Toprol was lowered due to fatigue and soft BP  Past Medical History:  Diagnosis Date  . Anemia in chronic renal disease 05/07/2017  . Anxiety   . Atrial fibrillation ([place])   . COPD (chronic obstructive pulmonary disease) ([place])    pt. denies  . Coronary artery disease    a. h/o Overlapping stents RCA;  b. 06/2011 Cath: patent stents, nonobs dzs, NL EF.  . Diabetic peripheral neuropathy ([place])   . 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 DM (diabetes mellitus) ([place])    Type 2, peripheral neuropathy.  . Dyspnea    with exertion  . Dysrhythmia   . GERD (gastroesophageal reflux disease)   . Headache   . History of bronchitis   . History of kidney stones   . History of radiation therapy 02/19/16 - 03/13/16   Testis/Scrotum: 32.4 Gy in 18 fractions  . History of radiation therapy 08/07/16-08/20/16   left adrenal gland mass treated to 30 Gy in 10 fractions  . Hyperlipidemia   . Hypertension   . Iron deficiency anemia due to chronic blood loss 08/08/2017  . Low testosterone   . Nephrolithiasis   . OSA (obstructive sleep apnea) 11/26/2017  . Osteoarthritis    shoulder  . Restless leg   . SVT (supraventricular tachycardia) ([place])   . Urinary frequency   . Wears partial dentures    upper and lower    Past Surgical History:  Procedure Laterality Date  . APPENDECTOMY    .  [place]  . CARDIAC CATHETERIZATION  01/2013  . CATARACT EXTRACTION, BILATERAL    . CHOLECYSTECTOMY    . COLONOSCOPY    . CORONARY ANGIOPLASTY  2004  . CYSTOSCOPY N/A 08/18/2017   Procedure: CYSTOSCOPY WITH FULGURATION AND SUPRA PUBIC TUBE PLACEMENT;  Surgeon: Kathie Rhodes, MD;  Location: [facility] ORS;  Service: Urology;  Laterality: N/A;  . EYE SURGERY Bilateral    cataracts  . IR GENERIC HISTORICAL  10/05/2015   IR US GUIDE VASC ACCESS RIGHT 10/05/2015 Marybelle Killings, MD [facility]INTER[facility]  . IR GENERIC HISTORICAL  10/05/2015   IR FLUORO GUIDE PORT INSERTION RIGHT  10/05/2015 Marybelle Killings, MD [facility]  . LEFT HEART CATHETERIZATION WITH CORONARY ANGIOGRAM N/A 06/18/2011   Procedure: LEFT HEART CATHETERIZATION WITH CORONARY ANGIOGRAM;  Surgeon: Peter M Martinique, MD;  Location: [facility] C[facility] LAB;  Service: Cardiovascular;  Laterality: N/A;  . LEFT HEART CATHETERIZATION WITH CORONARY ANGIOGRAM N/A 01/27/2013   Procedure: LEFT HEART CATHETERIZATION WITH CORONARY ANGIOGRAM;  Surgeon: Burnell Blanks, MD;  Location: [facility] CATH LAB;  Service: Cardiovascular;  Laterality: N/A;  . LUMBAR LAMINECTOMY/DECOMPRESSION MICRODISCECTOMY N/A 02/07/2015   Procedure: Lumbar three-Sacral one Decompression;  Surgeon: Kevan Ny Ditty, MD;  Location: [facility]URO ORS;  Service: Neurosurgery;  Laterality: N/A;  L3 to S1 Decompression  . MULTIPLE TOOTH EXTRACTIONS    . ORCHIECTOMY Right 09/01/2015   Procedure: RIGHT ORCHIECTOMY;  Surgeon: Kathie Rhodes, MD;  Location: [facility] ORS;  Service: Urology;  Laterality: Right;  . port a cath in place     . ROTATOR CUFF REPAIR Left      Inpatient Medications: Scheduled Meds: . sodium chloride flush  3 mL Intravenous Once   Continuous Infusions: . magnesium sulfate bolus IVPB 2 g (08/07/19 1816)   PRN Meds:   Home Meds: Prior to Admission medications   Medication Sig Start Date End Date Taking? Authorizing Provider  acetaminophen (TYLENOL) 500 MG tablet Take 1,000 mg by mouth every 6 (six) hours as needed (for pain/fever/headaches.).     [provider]  albuterol (VENTOLIN HFA) 108 (90 Base) MCG/ACT inhaler Inhale 2 puffs into the lungs every 4 (four) hours as needed for wheezing or shortness of breath. 05/24/19   Laurey Morale, MD  atorvastatin (LIPITOR) 40 MG tablet TAKE 1 TABLET BY MOUTH EVERY DAY Patient taking differently: Take 40 mg by mouth daily.  06/08/19   [name], Alinda Sierras, MD  cephALEXin (KEFLEX) 250 MG capsule Take 1 capsule (250 mg total) by mouth 3 (three) times daily. 07/23/19   Drenda Freeze, MD  docusate sodium  (COLACE) 50 MG capsule Take 1 capsule (50 mg total) by mouth 2 (two) times daily as needed (for constipation). 04/22/16   [name], Alinda Sierras, MD  furosemide (LASIX) 40 MG tablet TAKE 1 TABLET BY MOUTH EVERY OTHER DAY. 07/26/19   [name], Alinda Sierras, MD  gabapentin (NEURONTIN) 300 MG capsule TAKE 2 CAPSULES BY MOUTH 3 TIMES A DAY 08/06/19   [name], Alinda Sierras, MD  glucose blood (ACCU-CHEK AVIVA PLUS) test strip 1 each by Other route 3 (three) times daily. for testing 08/25/18   [name], Alinda Sierras, MD  KLOR-CON M20 20 MEQ tablet TAKE 1 TABLET BY MOUTH EVERY DAY Patient taking differently: Take 20 mEq by mouth in the morning.  07/19/19   [name], Alinda Sierras, MD  lidocaine-prilocaine (EMLA) cream Apply 1 application topically as needed. Apply to [facility] one hour before procedure. Patient taking differently: Apply 1 application topically as  needed (to [facility] one hour before procedure.).  06/29/19   Volanda Napoleon, MD  Melatonin 1 MG TABS Take 1 tablet (1 mg total) by mouth at bedtime. 08/14/18   [name], Alinda Sierras, MD  metFORMIN (GLUCOPHAGE) 500 MG tablet TAKE 1 TABLET BY MOUTH TWICE A DAY WITH MEALS 08/06/19   [name], Alinda Sierras, MD  methylPREDNISolone (MEDROL DOSEPAK) 4 MG TBPK tablet As directed 05/24/19   Laurey Morale, MD  metoprolol succinate (TOPROL-XL) 25 MG 24 hr tablet Take 1 tablet (25 mg total) by mouth daily. 11/24/18   Burnell Blanks, MD  nitroGLYCERIN (NITROSTAT) 0.4 MG SL tablet Place 1 tablet (0.4 mg total) under the tongue every 5 (five) minutes as needed. Chest pain Patient taking differently: Place 0.4 mg under the tongue every 5 (five) minutes as needed for chest pain.  01/20/14   Burnell Blanks, MD  nystatin (MYCOSTATIN/NYSTOP) powder APPLY TO AFFECTED AREA 4 TIMES A DAY Patient taking differently: Apply 1 application topically 4 (four) times daily as needed (to affected areas).  04/03/19   [name], Alinda Sierras, MD  nystatin-triamcinolone ointment (MYCOLOG) Apply 1 application  topically 2 (two) times daily. 12/26/17   [name], Alinda Sierras, MD  ondansetron (ZOFRAN ODT) 4 MG disintegrating tablet 4mg  ODT q4 hours prn nausea/vomit 07/23/19   Drenda Freeze, MD  pantoprazole (PROTONIX) 40 MG tablet TAKE 1 TABLET BY MOUTH EVERY DAY Patient taking differently: Take 40 mg by mouth in the morning.  11/30/18   [name], Alinda Sierras, MD  polyethylene glycol (MIRALAX / Floria Raveling) packet Take 17 g by mouth daily. 06/20/17   Patrecia Pour, Christean Grief, MD  pramipexole (MIRAPEX) 1.5 MG tablet TAKE 2 TABLETS BY MOUTH EVERY EVENING Patient taking differently: Take 3 mg by mouth every evening.  04/19/19   [name], Alinda Sierras, MD  warfarin (COUMADIN) 5 MG tablet Take 1/2 tablet daily except take 1 tablet on Sun Tues and Thurs or Take as directed by anticoagulation clinic 07/26/19   [name], MD  metoprolol (LOPRESSOR) 50 MG tablet  06/08/10 03/27/11  [provider]  spironolactone (ALDACTONE) 25 MG tablet  01/14/11 03/27/11  [provider]    Allergies:    Allergies  Allergen Reactions  . Latex Other (See Comments)    Reddens the skin  . Ace Inhibitors Cough  . Codeine Nausea Only and Rash       . Penicillins Rash    Childhood allergy Has patient had a PCN reaction causing immediate rash, facial/tongue/throat swelling, SOB or lightheadedness with hypotension: Yes Has patient had a PCN reaction causing severe rash involving mucus membranes or skin necrosis: Yes Has patient had a PCN reaction that required hospitalization No Has patient had a PCN reaction occurring within the last 10 years: No If all of the above answers are "NO", then may proceed with Cephalosporin use.     Social History:   Social History   Socioeconomic History  . Marital status: Married    Spouse name: Not on file  . Number of children: 3  . Years of education: Not on file  . Highest education level: Not on file  Occupational History  . Occupation: Retired    Fish farm manager: RETIRED  Tobacco  Use  . Smoking status: Former Smoker    Packs/day: 1.50    Years: 20.00    Pack years: 30.00    Types: Cigarettes    Quit date: 04/05/1978    Years since quitting: 41.3  . Smokeless tobacco: Never  Used  Vaping Use  . Vaping Use: Never used  Substance and Sexual Activity  . Alcohol use: No  . Drug use: No  . Sexual activity: Yes    Birth control/protection: None    Comment: Married  Other Topics Concern  . Not on file  Social History Narrative  . Not on file   Social Determinants of Health   Financial Resource Strain:   . Difficulty of Paying Living Expenses:   Food Insecurity:   . Worried About Charity fundraiser in the Last Year:   . Arboriculturist in the Last Year:   Transportation Needs:   . Film/video editor (Medical):   Marland Kitchen Lack of Transportation (Non-Medical):   Physical Activity:   . Days of Exercise per Week:   . Minutes of Exercise per Session:   Stress:   . Feeling of Stress :   Social Connections:   . Frequency of Communication with Friends and Family:   . Frequency of Social Gatherings with Friends and Family:   . Attends Religious Services:   . Active Member of Clubs or Organizations:   . Attends Archivist Meetings:   Marland Kitchen Marital Status:   Intimate Partner Violence:   . Fear of Current or Ex-Partner:   . Emotionally Abused:   Marland Kitchen Physically Abused:   . Sexually Abused:      Family History:    Family History  Problem Relation Age of Onset  . Alzheimer's disease Mother   . Heart disease Mother   . Heart disease Father   . Migraines Father   . Ulcers Father   . Prostate cancer Brother   . Coronary artery disease Other        Male 1st degree relative <50  . Coronary artery disease Other        male 1st degree relative <60  . Heart disease Sister   . Obesity Sister        Morbid  . Arthritis Sister   . Heart disease Brother   . Arthritis Brother   . Sleep apnea Son   . Obesity Son   . Migraines Daughter   . Thyroid disease  Daughter       ROS:  Please see the history of present illness.   All other ROS reviewed and negative.     Physical Exam/Data:   Vitals:   08/07/19 1642 08/07/19 1645 08/07/19 1736 08/07/19 1815  BP: 123/69 93/66 109/67 (!) 103/64  Pulse: (!) 170 (!) 168 99 98  Resp: 16 20  17   Temp:      TempSrc:      SpO2: 100% 97% 97% 97%  Weight:      Height:       No intake or output data in the 24 hours ending 08/07/19 1833 Last 3 Weights 08/07/2019 08/02/2019 07/19/2019  Weight (lbs) 232 lb 237 lb 1.6 oz 232 lb 9.6 oz  Weight (kg) 105.235 kg 107.548 kg 105.507 kg     Body mass index is 34.26 kg/m.  General: Well developed, well nourished, in no acute distress. Head: Normocephalic, atraumatic, sclera non-icteric, no xanthomas, nares are without discharge.  Neck: Negative for carotid bruits. JVD not elevated. Lungs: Clear bilaterally to auscultation without wheezes, rales, or rhonchi. Breathing is unlabored. Heart: RRR with S1 S2. No murmurs, rubs, or gallops appreciated. Abdomen: Soft, non-tender, non-distended with normoactive bowel sounds. No hepatomegaly. No rebound/guarding. No obvious abdominal masses. Msk:  Strength and tone appear  normal for age. Extremities: 2-3+ bilateral pitting edema Neuro: Alert and oriented X 3. No facial asymmetry. No focal deficit. Moves all extremities spontaneously. Psych:  Responds to questions appropriately with a normal affect.   EKG:  The EKG was personally reviewed and demonstrates:   Telemetry:  Telemetry was personally reviewed and demonstrates:    Relevant CV Studies: See HPI  Laboratory Data:  High Sensitivity Troponin:   Recent Labs  Lab 07/23/19 1858 07/23/19 2015 08/07/19 1642  TROPONINIHS 4 5 7      Cardiac EnzymesNo results for input(s): TROPONINI in the last 168 hours. No results for input(s): TROPIPOC in the last 168 hours.  Chemistry Recent Labs  Lab 08/02/19 1354 08/07/19 1121  NA 138 135  K 4.0 3.6  CL 102 99  CO2  26 24  GLUCOSE 93 164*  BUN 21 24*  CREATININE 1.32* 1.88*  CALCIUM 8.6 8.4*  GFRNONAA  --  32*  GFRAA  --  37*  ANIONGAP  --  12    No results for input(s): PROT, ALBUMIN, AST, ALT, ALKPHOS, BILITOT in the last 168 hours. Hematology Recent Labs  Lab 08/07/19 1121  WBC 6.4  RBC 3.14*  HGB 9.6*  HCT 30.9*  MCV 98.4  MCH 30.6  MCHC 31.1  RDW 15.1  PLT 170   BNP Recent Labs  Lab 08/07/19 1642  BNP 72.5    DDimer No results for input(s): DDIMER in the last 168 hours.   Radiology/Studies:  DG Chest Portable 1 View  Result Date: 08/07/2019 CLINICAL DATA:  Shortness of breath, weakness and tremors EXAM: PORTABLE CHEST 1 VIEW COMPARISON:  Radiograph 07/12/2019, CT 07/23/2019 FINDINGS: Nondisplaced lateral eighth rib fracture, possibly acute to subacute with history of recent fall. No other acute or suspicious osseous or soft tissue abnormality. Degenerative changes are present in the imaged spine and shoulders. Atelectatic changes are present in the left lung base with more chronic elevation of the left hemidiaphragm similar to priors. Chronically coarsened interstitial changes are similar to comparison exams. No consolidation, features of edema, pneumothorax, or effusion. Stable cardiomediastinal contours with a borderline enlarged cardiac silhouette and a calcified, tortuous aorta. Right IJ approach Port-A-Cath tip terminates in the lower SVC. Telemetry leads and pacer pads overlie the chest. IMPRESSION: 1. Nondisplaced lateral eighth rib fracture, possibly acute to subacute with history of recent fall. 2. Chronically coarsened interstitial changes and left basilar atelectasis. 3. No other acute cardiopulmonary abnormalities. 4. Stable cardiomegaly. 5.  Aortic Atherosclerosis (ICD10-I70.0). Electronically Signed   By: Lovena Le M.D.   On: 08/07/2019 16:43    Assessment and Plan:   1. SVT He has intermittent episodes of lightheadedness, which may or may not be due to  intermittent SVT that he was found to have on presentation to the ED.  He has not had palpitations or other symptoms of SVT.  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  For now would recommend beta-blocker.  If he continues to have frequent episodes we can consider catheter ablation versus antiarrhythmic therapy. - Continue metoprolol 25 mg daily -We will consider antiarrhythmic versus catheter ablation if he continues to have episodes  2.  Lower extremity edema Unclear etiology at this point.  He really does not have a good story for heart failure given lack of dyspnea, orthopnea, or weight gain.  I do not appreciate any JVD on exam and his chest x-ray is clear.  His creatinine has gone up since  last checked, and during this time his Lasix was increased, and therefore this may actually represent overdiuresis.  For now would recommend holding diuretics and rechecking creatinine tomorrow.  We will get an echocardiogram tomorrow.  If you are still confused regarding his volume status and the etiology of his edema, we can consider right heart cath on Monday. --Hold diuretics tonight --Repeat creatinine in the morning --Echocardiogram tomorrow      For questions or updates, please contact [place] Please consult [email or web address] for contact info under     Signed, Chriss Czar, MD  08/07/2019 6:33 PM

## 2019-08-07 NOTE — ED Provider Notes (Signed)
[facility] EMERGENCY DEPARTMENT Provider Note   CSN: [id number] Arrival date & time: 08/07/19  1102     History Chief Complaint  Patient presents with  . Weakness    Gary Thune. is a 86 y.o. male.  The history is provided by the patient.  Weakness Severity:  Moderate Onset quality:  Gradual Timing:  Constant Progression:  Unchanged Chronicity:  New Context: not drug use   Relieved by:  Nothing Worsened by:  Nothing Ineffective treatments:  None tried Associated symptoms: shortness of breath   Associated symptoms: no abdominal pain, no arthralgias, no chest pain, no cough, no dysuria, no fever, no seizures and no vomiting   Risk factors: coronary artery disease        Past Medical History:  Diagnosis Date  . Anemia in chronic renal disease 05/07/2017  . Anxiety   . Atrial fibrillation ([place])   . COPD (chronic obstructive pulmonary disease) ([place])    pt. denies  . Coronary artery disease    a. h/o Overlapping stents RCA;  b. 06/2011 Cath: patent stents, nonobs dzs, NL EF.  . Diabetic peripheral neuropathy ([place])   . Diffuse non-Hodgkin's lymphoma of testis ([place]) 09/28/2015  . DM (diabetes mellitus) ([place])    Type 2, peripheral neuropathy.  . Dyspnea    with exertion  . Dysrhythmia   . GERD (gastroesophageal reflux disease)   . Headache   . History of bronchitis   . History of kidney stones   . History of radiation therapy 02/19/16 - 03/13/16   Testis/Scrotum: 32.4 Gy in 18 fractions  . History of radiation therapy 08/07/16-08/20/16   left adrenal gland mass treated to 30 Gy in 10 fractions  . Hyperlipidemia   . Hypertension   . Iron deficiency anemia due to chronic blood loss 08/08/2017  . Low testosterone   . Nephrolithiasis   . OSA (obstructive sleep apnea) 11/26/2017  . Osteoarthritis    shoulder  . Restless leg   . SVT (supraventricular tachycardia) ([place])   . Urinary frequency   . Wears partial dentures    upper and lower    Patient  Active Problem List   Diagnosis Date Noted  . OSA (obstructive sleep apnea) 11/26/2017  . Iron deficiency anemia due to chronic blood loss 08/08/2017  . Sepsis secondary to UTI ([place]) 06/17/2017  . Thrombocytopenia ([place]) 06/17/2017  . Acute urinary retention 06/17/2017  . Sepsis ([place]) 06/06/2017  . Anemia in chronic renal disease 05/07/2017  . Long term (current) use of anticoagulants 12/10/2016  . V-tach ([place]) 07/02/2016  . Protein-calorie malnutrition, severe 07/02/2016  . Bilateral lower extremity edema 07/01/2016  . Bilateral leg edema 06/11/2016  . 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 Lumbosacral spondylosis with radiculopathy 02/07/2015  . At high risk for falls 05/05/2014  . Encounter for therapeutic drug monitoring 03/15/2013  . Chest pain 02/03/2013  . Obesity (BMI 30-39.9) 09/21/2012  . Type 2 diabetes mellitus with diabetic neuropathy, unspecified ([place]) 07/03/2012  . Chronic kidney disease, stage III (moderate) 07/03/2012  . Restless leg syndrome 12/04/2011  . Long term current use of anticoagulant therapy 07/15/2011  . Paroxysmal atrial fibrillation ([place]) 06/21/2011  . Upper extremity weakness 06/21/2011  . Midsternal chest pain 06/19/2011  . Coronary artery disease   . Hypertension   . Hyperlipidemia   . GERD (gastroesophageal reflux disease)   . Peripheral neuropathy   . Dyslipidemia 03/27/2011  . Diabetic peripheral neuropathy ([place]) 12/17/2010  . Hypotestosteronism 05/07/2010  .  LEG CRAMPS, IDIOPATHIC 08/14/2009  . Insomnia 08/14/2009  . Malignant neoplasm of skin of face 08/14/2009  . OBESITY 08/02/2009  . HYPERTENSION, HEART CONTROLLED W/O ASSOC CHF 03/28/2008  . CAD, NATIVE VESSEL 03/28/2008  . G E R D 01/12/2007  . Morbid obesity ([place]) 09/15/2006  . Hyperlipidemia, mixed 07/01/2006  . Essential hypertension 07/01/2006  . Coronary atherosclerosis 07/01/2006  . OSTEOARTHRITIS 07/01/2006    Past Surgical History:  Procedure  Laterality Date  . APPENDECTOMY    . [place]  . CARDIAC CATHETERIZATION  01/2013  . CATARACT EXTRACTION, BILATERAL    . CHOLECYSTECTOMY    . COLONOSCOPY    . CORONARY ANGIOPLASTY  2004  . CYSTOSCOPY N/A 08/18/2017   Procedure: CYSTOSCOPY WITH FULGURATION AND SUPRA PUBIC TUBE PLACEMENT;  Surgeon: Kathie Rhodes, MD;  Location: [facility] ORS;  Service: Urology;  Laterality: N/A;  . EYE SURGERY Bilateral    cataracts  . IR GENERIC HISTORICAL  10/05/2015   IR US GUIDE VASC ACCESS RIGHT 10/05/2015 Marybelle Killings, MD [facility]-INTERV RAD  . IR GENERIC HISTORICAL  10/05/2015   IR FLUORO GUIDE PORT INSERTION RIGHT 10/05/2015 Marybelle Killings, MD [facility]  . LEFT HEART CATHETERIZATION WITH CORONARY ANGIOGRAM N/A 06/18/2011   Procedure: LEFT HEART CATHETERIZATION WITH CORONARY ANGIOGRAM;  Surgeon: Peter M Martinique, MD;  Location[facility] CATH LAB;  Service: Cardiovascular;  Laterality: N/A;  . LEFT HEART CATHETERIZATION WITH CORONARY ANGIOGRAM N/A 01/27/2013   Procedure: LEFT HEART CATHETERIZATION WITH CORONARY ANGIOGRAM;  Surgeon: Burnell Blanks, MD;  Location: [facility] CATH LAB;  Service: Cardiovascular;  Laterality: N/A;  . LUMBAR LAMINECTOMY/DECOMPRESSION MICRODISCECTOMY N/A 02/07/2015   Procedure: Lumbar three-Sacral one Decompression;  Surgeon: Kevan Ny Ditty, MD;  Location: [facility] NEURO ORS;  Service: Neurosurgery;  Laterality: N/A;  L3 to S1 Decompression  . MULTIPLE TOOTH EXTRACTIONS    . ORCHIECTOMY Right 09/01/2015   Procedure: RIGHT ORCHIECTOMY;  Surgeon: Kathie Rhodes, MD;  Location: [facility] ORS;  Service: Urology;  Laterality: Right;  . port a cath in place     . ROTATOR CUFF REPAIR Left        Family History  Problem Relation Age of Onset  . Alzheimer's disease Mother   . Heart disease Mother   . Heart disease Father   . Migraines Father   . Ulcers Father   . Prostate cancer Brother   . Coronary artery disease Other        Male 1st degree relative <50  . Coronary artery disease Other         male 1st degree relative <60  . Heart disease Sister   . Obesity Sister        Morbid  . Arthritis Sister   . Heart disease Brother   . Arthritis Brother   . Sleep apnea Son   . Obesity Son   . Migraines Daughter   . Thyroid disease Daughter     Social History   Tobacco Use  . Smoking status: Former Smoker    Packs/day: 1.50    Years: 20.00    Pack years: 30.00    Types: Cigarettes    Quit date: 04/05/1978    Years since quitting: 41.3  . Smokeless tobacco: Never Used  Vaping Use  . Vaping Use: Never used  Substance Use Topics  . Alcohol use: No  . Drug use: No    Home Medications Prior to Admission medications   Medication Sig Start Date End Date Taking? Authorizing Provider  acetaminophen (TYLENOL) 500 MG tablet Take 1,000 mg by mouth every 6 (six) hours as needed (for pain/fever/headaches.).     [provider]  albuterol (VENTOLIN HFA) 108 (90 Base) MCG/ACT inhaler Inhale 2 puffs into the lungs every 4 (four) hours as needed for wheezing or shortness of breath. 05/24/19   Laurey Morale, MD  atorvastatin (LIPITOR) 40 MG tablet TAKE 1 TABLET BY MOUTH EVERY DAY Patient taking differently: Take 40 mg by mouth daily.  06/08/19   [name], Alinda Sierras, MD  cephALEXin (KEFLEX) 250 MG capsule Take 1 capsule (250 mg total) by mouth 3 (three) times daily. 07/23/19   Drenda Freeze, MD  docusate sodium (COLACE) 50 MG capsule Take 1 capsule (50 mg total) by mouth 2 (two) times daily as needed (for constipation). 04/22/16   [name], Alinda Sierras, MD  furosemide (LASIX) 40 MG tablet TAKE 1 TABLET BY MOUTH EVERY OTHER DAY. 07/26/19   [name], Alinda Sierras, MD  gabapentin (NEURONTIN) 300 MG capsule TAKE 2 CAPSULES BY MOUTH 3 TIMES A DAY 08/06/19   [name], Alinda Sierras, MD  glucose blood (ACCU-CHEK AVIVA PLUS) test strip 1 each by Other route 3 (three) times daily. for testing 08/25/18   [name], Alinda Sierras, MD  KLOR-CON M20 20 MEQ tablet TAKE 1 TABLET BY MOUTH EVERY DAY Patient taking  differently: Take 20 mEq by mouth in the morning.  07/19/19   [name], Alinda Sierras, MD  lidocaine-prilocaine (EMLA) cream Apply 1 application topically as needed. Apply to [facility] one hour before procedure. Patient taking differently: Apply 1 application topically as needed (to PAC one hour before procedure.).  06/29/19   Volanda Napoleon, MD  Melatonin 1 MG TABS Take 1 tablet (1 mg total) by mouth at bedtime. 08/14/18   [name], Alinda Sierras, MD  metFORMIN (GLUCOPHAGE) 500 MG tablet TAKE 1 TABLET BY MOUTH TWICE A DAY WITH MEALS 08/06/19   [name], Alinda Sierras, MD  methylPREDNISolone (MEDROL DOSEPAK) 4 MG TBPK tablet As directed 05/24/19   Laurey Morale, MD  metoprolol succinate (TOPROL-XL) 25 MG 24 hr tablet Take 1 tablet (25 mg total) by mouth daily. 11/24/18   Burnell Blanks, MD  nitroGLYCERIN (NITROSTAT) 0.4 MG SL tablet Place 1 tablet (0.4 mg total) under the tongue every 5 (five) minutes as needed. Chest pain Patient taking differently: Place 0.4 mg under the tongue every 5 (five) minutes as needed for chest pain.  01/20/14   Burnell Blanks, MD  nystatin (MYCOSTATIN/NYSTOP) powder APPLY TO AFFECTED AREA 4 TIMES A DAY Patient taking differently: Apply 1 application topically 4 (four) times daily as needed (to affected areas).  04/03/19   [name], Alinda Sierras, MD  nystatin-triamcinolone ointment (MYCOLOG) Apply 1 application topically 2 (two) times daily. 12/26/17   [name], Alinda Sierras, MD  ondansetron (ZOFRAN ODT) 4 MG disintegrating tablet 4mg  ODT q4 hours prn nausea/vomit 07/23/19   Drenda Freeze, MD  pantoprazole (PROTONIX) 40 MG tablet TAKE 1 TABLET BY MOUTH EVERY DAY Patient taking differently: Take 40 mg by mouth in the morning.  11/30/18   [name], Alinda Sierras, MD  polyethylene glycol (MIRALAX / Floria Raveling) packet Take 17 g by mouth daily. 06/20/17   Patrecia Pour, Christean Grief, MD  pramipexole (MIRAPEX) 1.5 MG tablet TAKE 2 TABLETS BY MOUTH EVERY EVENING Patient taking differently: Take 3 mg by  mouth every evening.  04/19/19   [name], Alinda Sierras, MD  warfarin (COUMADIN) 5 MG tablet Take 1/2 tablet daily except take 1 tablet on Sun Tues  and Thurs or Take as directed by anticoagulation clinic 07/26/19   [name], MD  metoprolol (LOPRESSOR) 50 MG tablet  06/08/10 03/27/11  [provider]  spironolactone (ALDACTONE) 25 MG tablet  01/14/11 03/27/11  [provider]    Allergies    Latex, Ace inhibitors, Codeine, and Penicillins  Review of Systems   Review of Systems  Constitutional: Negative for chills and fever.  HENT: Negative for ear pain and sore throat.   Eyes: Negative for pain and visual disturbance.  Respiratory: Positive for shortness of breath. Negative for cough.   Cardiovascular: Positive for leg swelling. Negative for chest pain and palpitations.  Gastrointestinal: Negative for abdominal pain and vomiting.  Genitourinary: Negative for dysuria and hematuria.  Musculoskeletal: Negative for arthralgias and back pain.  Skin: Negative for color change and rash.  Neurological: Positive for weakness. Negative for seizures and syncope.  All other systems reviewed and are negative.   Physical Exam Updated Vital Signs  ED Triage Vitals [08/07/19 1059]  Enc Vitals Group     BP (!) 108/58     Pulse Rate 105     Resp 19     Temp 98 F (36.7 C)     Temp Source Oral     SpO2 94 %     Weight (!) 232 lb (105.2 kg)     Height 5\' 9"  (1.753 m)     Head Circumference      Peak Flow      Pain Score      Pain Loc      Pain Edu?      Excl. in [place]?     Physical Exam Vitals and nursing note reviewed.  Constitutional:      General: He is not in acute distress.    Appearance: He is well-developed. He is not ill-appearing.  HENT:     Head: Normocephalic and atraumatic.     Nose: Nose normal.     Mouth/Throat:     Mouth: Mucous membranes are moist.  Eyes:     Extraocular Movements: Extraocular movements intact.     Conjunctiva/sclera: Conjunctivae  normal.     Pupils: Pupils are equal, round, and reactive to light.  Cardiovascular:     Rate and Rhythm: Normal rate and regular rhythm.     Pulses: Normal pulses.     Heart sounds: Normal heart sounds. No murmur heard.   Pulmonary:     Effort: Pulmonary effort is normal. No respiratory distress.     Breath sounds: Normal breath sounds.  Abdominal:     Palpations: Abdomen is soft.     Tenderness: There is no abdominal tenderness.  Musculoskeletal:     Cervical back: Normal range of motion and neck supple.     Right lower leg: Edema (3+) present.     Left lower leg: Edema (3+) present.  Skin:    General: Skin is warm and dry.     Capillary Refill: Capillary refill takes less than 2 seconds.  Neurological:     General: No focal deficit present.     Mental Status: He is alert.  Psychiatric:        Mood and Affect: Mood normal.     ED Results / Procedures / Treatments   Labs (all labs ordered are listed, but only abnormal results are displayed) Labs Reviewed  BASIC METABOLIC PANEL - Abnormal; Notable for the following components:      Result Value   Glucose, Bld 164 (*)  BUN 24 (*)    Creatinine, Ser 1.88 (*)    Calcium 8.4 (*)    GFR calc non Af Amer 32 (*)    GFR calc Af Amer 37 (*)    All other components within normal limits  CBC - Abnormal; Notable for the following components:   RBC 3.14 (*)    Hemoglobin 9.6 (*)    HCT 30.9 (*)    All other components within normal limits  PROTIME-INR - Abnormal; Notable for the following components:   Prothrombin Time 19.5 (*)    INR 1.7 (*)    All other components within normal limits  MAGNESIUM - Abnormal; Notable for the following components:   Magnesium 1.2 (*)    All other components within normal limits  CBG MONITORING, ED - Abnormal; Notable for the following components:   Glucose-Capillary 133 (*)    All other components within normal limits  SARS CORONAVIRUS 2 BY RT PCR (HOSPITAL ORDER, [place] LAB)  BRAIN NATRIURETIC PEPTIDE  URINALYSIS, ROUTINE W REFLEX MICROSCOPIC  HEPATIC FUNCTION PANEL  TROPONIN I (HIGH SENSITIVITY)  TROPONIN I (HIGH SENSITIVITY)    EKG EKG Interpretation  Date/Time:  Saturday August 07 2019 17:14:02 EDT Ventricular Rate:  105 PR Interval:  174 QRS Duration: 142 QT Interval:  368 QTC Calculation: 486 R Axis:   -83 Text Interpretation: Sinus tachycardia Right bundle branch block Left anterior fascicular block Possible Lateral infarct , age undetermined Abnormal ECG Confirmed by Lennice Sites [phone]) on 08/07/2019 5:27:19 PM   Radiology DG Chest Portable 1 View  Result Date: 08/07/2019 CLINICAL DATA:  Shortness of breath, weakness and tremors EXAM: PORTABLE CHEST 1 VIEW COMPARISON:  Radiograph 07/12/2019, CT 07/23/2019 FINDINGS: Nondisplaced lateral eighth rib fracture, possibly acute to subacute with history of recent fall. No other acute or suspicious osseous or soft tissue abnormality. Degenerative changes are present in the imaged spine and shoulders. Atelectatic changes are present in the left lung base with more chronic elevation of the left hemidiaphragm similar to priors. Chronically coarsened interstitial changes are similar to comparison exams. No consolidation, features of edema, pneumothorax, or effusion. Stable cardiomediastinal contours with a borderline enlarged cardiac silhouette and a calcified, tortuous aorta. Right IJ approach Port-A-Cath tip terminates in the lower SVC. Telemetry leads and pacer pads overlie the chest. IMPRESSION: 1. Nondisplaced lateral eighth rib fracture, possibly acute to subacute with history of recent fall. 2. Chronically coarsened interstitial changes and left basilar atelectasis. 3. No other acute cardiopulmonary abnormalities. 4. Stable cardiomegaly. 5.  Aortic Atherosclerosis (ICD10-I70.0). Electronically Signed   By: Lovena Le M.D.   On: 08/07/2019 16:43    Procedures .Critical Care Performed by:  Lennice Sites, DO Authorized by: Lennice Sites, DO   Critical care provider statement:    Critical care time (minutes):  35   Critical care was necessary to treat or prevent imminent or life-threatening deterioration of the following conditions:  Circulatory failure   Critical care was time spent personally by me on the following activities:  Blood draw for specimens, development of treatment plan with patient or surrogate, discussions with consultants, discussions with primary provider, evaluation of patient's response to treatment, examination of patient, obtaining history from patient or surrogate, ordering and review of laboratory studies, ordering and performing treatments and interventions, ordering and review of radiographic studies, re-evaluation of patient's condition, pulse oximetry and review of old charts   I assumed direction of critical care for this patient from another provider in my  specialty: no     (including critical care time)  Medications Ordered in ED Medications  sodium chloride flush (NS) 0.9 % injection 3 mL (has no administration in time range)  magnesium sulfate IVPB 2 g 50 mL (has no administration in time range)  adenosine (ADENOCARD) 6 MG/2ML injection 6 mg (6 mg Intravenous Given 08/07/19 1712)    ED Course  I have reviewed the triage vital signs and the nursing notes.  Pertinent labs & imaging results that were available during my care of the patient were reviewed by me and considered in my medical decision making (see chart for details).    MDM Rules/Calculators/A&P                          Gary Yates. is an 86 year old male with history of paroxysmal atrial fibrillation on Coumadin, COPD, lymphoma in remission, diabetes who presents the ED with generalized weakness.  With EMS patient looked to be in SVT that resolved after IV stick.  Patient went out to waiting room upon recheck of his vitals while he was in the waiting room patient was found to be  hypotensive and tachycardic in the 170s.  It appeared to be that he was in SVT.  Vagal maneuvers failed.  Talked with cardiology and patient was given adenosine and converted back to a normal sinus rhythm.  Patient appears to have severe swelling in his lower legs.  He states that he has been having cramps and spasms in his legs.  Has had shortness of breath.  Has been too weak to walk around.  Had a recent fall in which he broke some ribs.  Does not have any chest pain.  INR subtherapeutic at 1.7.  Magnesium is low at 1.2.  However BNP and troponin within normal limits.  Creatinine slightly above baseline at 1.8.  Otherwise no significant anemia, electrolyte abnormality.  Chest x-ray shows 1 rib fracture in the left that we likely already know about.  Otherwise no signs of major volume overload.  Will replete magnesium.  Cardiology has evaluated the patient.  Recommends admission for overnight telemetry given that he may have some paroxysmal SVT and may need further medication regimen/possible ablation.  Patient has been symptomatic throughout these events but it is hard to tell if there is another process going on.  He does have significant leg edema however troponin, BNP are unremarkable.  He recommends echocardiogram.  Creatinine has increased in the setting of increased Lasix that has been started outpatient.  Recommends no further diuretics at this time.  Patient to be admitted to medicine for further care.  This chart was dictated using voice recognition software.  Despite best efforts to proofread,  errors can occur which can change the documentation meaning.    Final Clinical Impression(s) / ED Diagnoses Final diagnoses:  SVT (supraventricular tachycardia) (HCC)  Leg swelling  Hypomagnesemia    Rx / DC Orders ED Discharge Orders    None       Lennice Sites, DO 08/07/19 1808

## 2019-08-07 NOTE — ED Triage Notes (Signed)
EMS stated, Generalized weakness and tremors. Started with SVT and started IV and converted to sinus tack.  IV 18g in Left AC.  Pt has some weakness in both legs and swelling in legs.

## 2019-08-08 ENCOUNTER — Inpatient Hospital Stay (HOSPITAL_COMMUNITY): Payer: Medicare Other

## 2019-08-08 DIAGNOSIS — I472 Ventricular tachycardia: Secondary | ICD-10-CM

## 2019-08-08 DIAGNOSIS — M7989 Other specified soft tissue disorders: Secondary | ICD-10-CM

## 2019-08-08 LAB — COMPREHENSIVE METABOLIC PANEL
ALT: 6 U/L (ref 0–44)
AST: 13 U/L — ABNORMAL LOW (ref 15–41)
Albumin: 2.5 g/dL — ABNORMAL LOW (ref 3.5–5.0)
Alkaline Phosphatase: 69 U/L (ref 38–126)
Anion gap: 9 (ref 5–15)
BUN: 21 mg/dL (ref 8–23)
CO2: 25 mmol/L (ref 22–32)
Calcium: 8.1 mg/dL — ABNORMAL LOW (ref 8.9–10.3)
Chloride: 102 mmol/L (ref 98–111)
Creatinine, Ser: 1.46 mg/dL — ABNORMAL HIGH (ref 0.61–1.24)
GFR calc Af Amer: 50 mL/min — ABNORMAL LOW (ref >60)
GFR calc non Af Amer: 44 mL/min — ABNORMAL LOW (ref >60)
Glucose, Bld: 158 mg/dL — ABNORMAL HIGH (ref 70–99)
Potassium: 3.7 mmol/L (ref 3.5–5.1)
Sodium: 136 mmol/L (ref 135–145)
Total Bilirubin: 0.6 mg/dL (ref 0.3–1.2)
Total Protein: 5.5 g/dL — ABNORMAL LOW (ref 6.5–8.1)

## 2019-08-08 LAB — ECHOCARDIOGRAM COMPLETE
AR max vel: 1.46 cm2
AV Area VTI: 1.42 cm2
AV Area mean vel: 1.33 cm2
AV Mean grad: 10 mmHg
AV Peak grad: 17.7 mmHg
Ao pk vel: 2.11 m/s
Area-P 1/2: 2.83 cm2
Height: 69 in
S' Lateral: 3.2 cm
Weight: 3686.09 oz

## 2019-08-08 LAB — CK: Total CK: 68 U/L (ref 49–397)

## 2019-08-08 LAB — IRON AND TIBC
Iron: 38 ug/dL — ABNORMAL LOW (ref 45–182)
Saturation Ratios: 20 % (ref 17.9–39.5)
TIBC: 186 ug/dL — ABNORMAL LOW (ref 250–450)
UIBC: 148 ug/dL

## 2019-08-08 LAB — FERRITIN: Ferritin: 470 ng/mL — ABNORMAL HIGH (ref 24–336)

## 2019-08-08 LAB — GLUCOSE, CAPILLARY
Glucose-Capillary: 160 mg/dL — ABNORMAL HIGH (ref 70–99)
Glucose-Capillary: 146 mg/dL — ABNORMAL HIGH (ref 70–99)
Glucose-Capillary: 129 mg/dL — ABNORMAL HIGH (ref 70–99)
Glucose-Capillary: 156 mg/dL — ABNORMAL HIGH (ref 70–99)

## 2019-08-08 LAB — CBC
HCT: 26.7 % — ABNORMAL LOW (ref 39.0–52.0)
Hemoglobin: 8.5 g/dL — ABNORMAL LOW (ref 13.0–17.0)
MCH: 30.9 pg (ref 26.0–34.0)
MCHC: 31.8 g/dL (ref 30.0–36.0)
MCV: 97.1 fL (ref 80.0–100.0)
Platelets: 141 10*3/uL — ABNORMAL LOW (ref 150–400)
RBC: 2.75 MIL/uL — ABNORMAL LOW (ref 4.22–5.81)
RDW: 15.1 % (ref 11.5–15.5)
WBC: 6 10*3/uL (ref 4.0–10.5)
nRBC: 0 % (ref 0.0–0.2)

## 2019-08-08 LAB — VITAMIN B12: Vitamin B-12: 205 pg/mL (ref 180–914)

## 2019-08-08 LAB — FOLATE: Folate: 7.2 ng/mL (ref >5.9)

## 2019-08-08 LAB — MAGNESIUM: Magnesium: 1.6 mg/dL — ABNORMAL LOW (ref 1.7–2.4)

## 2019-08-08 LAB — PROTIME-INR
INR: 1.8 — ABNORMAL HIGH (ref 0.8–1.2)
Prothrombin Time: 20 seconds — ABNORMAL HIGH (ref 11.4–15.2)

## 2019-08-08 MED ORDER — POTASSIUM CHLORIDE CRYS ER 20 MEQ PO TBCR
20.0000 meq | EXTENDED_RELEASE_TABLET | Freq: Every morning | ORAL | Status: DC
  Administered 2019-08-08 – 2019-08-11 (×4): 20 meq via ORAL
  Filled 2019-08-08 (×4): qty 1

## 2019-08-08 MED ORDER — ADENOSINE 6 MG/2ML IV SOLN
INTRAVENOUS | Status: AC
  Administered 2019-08-08: 6 mg
  Filled 2019-08-08: qty 2

## 2019-08-08 MED ORDER — WARFARIN SODIUM 5 MG PO TABS
5.0000 mg | ORAL_TABLET | ORAL | Status: DC
  Administered 2019-08-08: 5 mg via ORAL
  Filled 2019-08-08: qty 1

## 2019-08-08 MED ORDER — ADENOSINE 6 MG/2ML IV SOLN: 6.0000 mg | Freq: Once | INTRAVENOUS | Status: DC

## 2019-08-08 MED ORDER — FUROSEMIDE 10 MG/ML IJ SOLN: 80.0000 mg | Freq: Once | INTRAMUSCULAR | Status: DC

## 2019-08-08 MED ORDER — SODIUM CHLORIDE 0.9 % IV SOLN
510.0000 mg | Freq: Once | INTRAVENOUS | Status: AC
  Administered 2019-08-08: 510 mg via INTRAVENOUS
  Filled 2019-08-08: qty 17

## 2019-08-08 MED ORDER — ADENOSINE 6 MG/2ML IV SOLN
INTRAVENOUS | Status: AC
  Filled 2019-08-08: qty 2

## 2019-08-08 MED ORDER — WARFARIN SODIUM 2.5 MG PO TABS: 2.5000 mg | ORAL_TABLET | ORAL | Status: DC

## 2019-08-08 MED ORDER — SODIUM CHLORIDE 0.9 % IV BOLUS: 250.0000 mL | Freq: Once | INTRAVENOUS | Status: DC | PRN

## 2019-08-08 MED ORDER — VITAMIN B-12 1000 MCG PO TABS
1000.0000 ug | ORAL_TABLET | Freq: Every day | ORAL | Status: DC
  Administered 2019-08-08 – 2019-08-11 (×4): 1000 ug via ORAL
  Filled 2019-08-08 (×4): qty 1

## 2019-08-08 MED ORDER — MAGNESIUM SULFATE 4 GM/100ML IV SOLN
4.0000 g | Freq: Once | INTRAVENOUS | Status: AC
  Administered 2019-08-08: 4 g via INTRAVENOUS
  Filled 2019-08-08: qty 100

## 2019-08-08 MED FILL — Medication: Qty: 1 | Status: AC

## 2019-08-08 NOTE — Progress Notes (Addendum)
Progress Note    Gary Yates.  OZD:[id number] DOB: Apr 16, 1933  DOA: 08/07/2019 PCP: [name], MD    Brief Narrative:     Medical records reviewed and are as summarized below:  Gary Garnet. is an 86 y.o. male with medical history significant of paroxysmal A. fib on Coumadin, COPD, CAD, non-Hodgkin's lymphoma in remission, hyperlipidemia, hypertension, coronary artery disease, obstructive sleep apnea, chronic renal insufficiency stage III diabetes mellitus type 2 who is status post fall approximately 2 weeks ago with left rib fractures and then has fallen twice more over the last few days.  He has had increasing weakness and fatigue as well as lower extremity edema.  He was seen by his PCP earlier this week and his Lasix was increased due to lower extremity swelling and some shortness of breath.  He reports his Lasix has not been working.  His last echo was in 2019 showed EF of 55 to 60%.  He was noted increasing spasms and shaking in his lower extremities and inability to really bear weight.  So he called EMS.  He was found to be in a weird tachyarrhythmia during that time.   Assessment/Plan:   Principal Problem:   Cardiac arrhythmia Active Problems:   Hyperlipidemia, mixed   Morbid obesity (HCC)   Essential hypertension   Coronary atherosclerosis   G E R D   Diabetic peripheral neuropathy (HCC)   Paroxysmal atrial fibrillation ([place])   Long term current use of anticoagulant therapy   Type 2 diabetes mellitus with diabetic neuropathy, unspecified (HCC)   Diffuse non-Hodgkin's lymphoma of testis (HCC)   Bilateral lower extremity edema   Anemia in chronic renal disease   OSA (obstructive sleep apnea)   SVT (supraventricular tachycardia) (HCC)   Hypomagnesemia   Aortic atherosclerosis (HCC)   Cardiac arrhythmia Currently in sinus Cardiology on board: the patient is minimally if at all symptomatic. His brief spells of lightheadedness may or may not be  related to SVT. I discussed the treatment options in detail. As long as he is not symptomatic and his SVT is quiet, I would suggest he continue his low dose beta blocker and undergo watchful waiting. If he has more SVT today and he can correlate symptoms with SVT the catheter ablation would be indicated.  History of paroxysmal atrial fibrillation on Coumadin Will continue warfarin at current dose INR was 1.7 today but previously in range  Lower extremity edema -b/l redness left > right, currently do not think infected -? If he would benefit from [facility] boots  -check ABIs first -hold on lasix due to hypotension  Acute on chronic renal insufficiency stage IIIb Likely secondary to diuresis Hold diuretics, trend creatinine  Hyperlipidemia Lipitor 40 mg daily  Hypertension Toprol 25 mg daily  Coronary artery disease No chest pain at present Negative troponins Cardiac echo pending  Diabetes On Metformin at home, on hold due to kidney function and acute hospitalization Sliding scale insulin Diabetic diet  GERD Protonix 40 mg daily  Diabetic neuropathy Neurontin 600 mg twice daily  History of non-Hodgkin's lymphoma in remission Last follow-up with Dr. Marin Olp in May 2021  Anemia of chronic disease Iron and B12 on the lower side  Hypomagnesemia -aggressive replacement  Obstructive sleep apnea -CPAP qhs?  Morbid obesity Could benefit from healthy lifestyle and weight loss. Estimated body mass index is 34.02 kg/m as calculated from the following:   Height as of this encounter: 5\' 9"  (1.753 m).  Weight as of this encounter: 104.5 kg.  Family Communication/Anticipated D/C date and plan/Code Status   DVT prophylaxis: coumadin Code Status: Full Code.   Disposition Plan: Status is: Inpatient  Remains inpatient appropriate because:Ongoing diagnostic testing needed not appropriate for outpatient work up   Dispo: The patient is from: Home               Anticipated d/c is to: Home              Anticipated d/c date is: 2 days              Patient currently is not medically stable to d/c.         Medical Consultants:    cards     Subjective:   No fevers/no chills or other sign of infection  Objective:    Vitals:   08/08/19 0409 08/08/19 0558 08/08/19 0805 08/08/19 1200  BP: (!) 98/54 (!) 110/62 (!) 99/49 (!) 107/59  Pulse: 72 79 80 72  Resp: 14 18 14 15   Temp: 97.9 F (36.6 C) 97.6 F (36.4 C) 97.6 F (36.4 C) 97.9 F (36.6 C)  TempSrc: Oral Oral Oral Oral  SpO2: 99% 98% 97% 94%  Weight:      Height:        Intake/Output Summary (Last 24 hours) at 08/08/2019 1415 Last data filed at 08/08/2019 1237 Gross per 24 hour  Intake 915.4 ml  Output 225 ml  Net 690.4 ml   Filed Weights   08/07/19 1059 08/07/19 2027  Weight: (!) 105.2 kg (!) 104.5 kg    Exam:  General: Appearance:    Obese male in no acute distress     Lungs:     Clear to auscultation bilaterally, respirations unlabored  Heart:  rrr  MS:   All extremities are intact. 2+pitting edema with b/l erythema to upper shins  Neurologic:   Awake, alert, oriented x 3. No apparent focal neurological           defect.     Data Reviewed:   I have personally reviewed following labs and imaging studies:  Labs: Labs show the following:   Basic Metabolic Panel: Recent Labs  Lab 08/02/19 1354 08/02/19 1354 08/07/19 1121 08/07/19 1642 08/08/19 0534  NA 138  --  135  --  136  K 4.0   < > 3.6  --  3.7  CL 102  --  99  --  102  CO2 26  --  24  --  25  GLUCOSE 93  --  164*  --  158*  BUN 21  --  24*  --  21  CREATININE 1.32*  --  1.88*  --  1.46*  CALCIUM 8.6  --  8.4*  --  8.1*  MG  --   --   --  1.2* 1.6*   < > = values in this interval not displayed.   GFR Estimated Creatinine Clearance: 44.9 mL/min (A) (by C-G formula based on SCr of 1.46 mg/dL (H)). Liver Function Tests: Recent Labs  Lab 08/07/19 1817 08/08/19 0534  AST 12* 13*  ALT 8 6    ALKPHOS 76 69  BILITOT 0.5 0.6  PROT 6.4* 5.5*  ALBUMIN 2.9* 2.5*   No results for input(s): LIPASE, AMYLASE in the last 168 hours. No results for input(s): AMMONIA in the last 168 hours. Coagulation profile Recent Labs  Lab 08/03/19 0000 08/07/19 1642 08/08/19 0534  INR 2.1 1.7* 1.8*  CBC: Recent Labs  Lab 08/07/19 1121 08/08/19 0534  WBC 6.4 6.0  HGB 9.6* 8.5*  HCT 30.9* 26.7*  MCV 98.4 97.1  PLT 170 141*   Cardiac Enzymes: No results for input(s): CKTOTAL, CKMB, CKMBINDEX, TROPONINI in the last 168 hours. BNP (last 3 results) No results for input(s): PROBNP in the last 8760 hours. CBG: Recent Labs  Lab 08/07/19 1618 08/07/19 2145 08/08/19 0636 08/08/19 1128  GLUCAP 133* 142* 160* 146*   D-Dimer: No results for input(s): DDIMER in the last 72 hours. Hgb A1c: No results for input(s): HGBA1C in the last 72 hours. Lipid Profile: No results for input(s): CHOL, HDL, LDLCALC, TRIG, CHOLHDL, LDLDIRECT in the last 72 hours. Thyroid function studies: No results for input(s): TSH, T4TOTAL, T3FREE, THYROIDAB in the last 72 hours.  Invalid input(s): FREET3 Anemia work up: Recent Labs    08/08/19 0533 08/08/19 0534  VITAMINB12  --  205  FOLATE 7.2  --   FERRITIN  --  470*  TIBC  --  186*  IRON  --  38*   Sepsis Labs: Recent Labs  Lab 08/07/19 1121 08/08/19 0534  WBC 6.4 6.0    Microbiology Recent Results (from the past 240 hour(s))  SARS Coronavirus 2 by RT PCR (hospital order, performed in [facility] hospital lab) Nasopharyngeal Nasopharyngeal Swab     Status: None   Collection Time: 08/07/19  4:42 PM   Specimen: Nasopharyngeal Swab  Result Value Ref Range Status   SARS Coronavirus 2 NEGATIVE NEGATIVE Final    Comment: (NOTE) SARS-CoV-2 target nucleic acids are NOT DETECTED.  The SARS-CoV-2 RNA is generally detectable in upper and lower respiratory specimens during the acute phase of infection. The lowest concentration of SARS-CoV-2 viral  copies this assay can detect is 250 copies / mL. A negative result does not preclude SARS-CoV-2 infection and should not be used as the sole basis for treatment or other patient management decisions.  A negative result may occur with improper specimen collection / handling, submission of specimen other than nasopharyngeal swab, presence of viral mutation(s) within the areas targeted by this assay, and inadequate number of viral copies (<250 copies / mL). A negative result must be combined with clinical observations, patient history, and epidemiological information.  Fact Sheet for Patients:   StrictlyIdeas.no  Fact Sheet for Healthcare Providers: BankingDealers.co.za  This test is not yet approved or  cleared by the [place] FDA and has been authorized for detection and/or diagnosis of SARS-CoV-2 by FDA under an Emergency Use Authorization (EUA).  This EUA will remain in effect (meaning this test can be used) for the duration of the COVID-19 declaration under Section 564(b)(1) of the Act, 21 U.S.C. section 360bbb-3(b)(1), unless the authorization is terminated or revoked sooner.  Performed at [facility], [place] [address], [place], [place] [address]   MRSA PCR Screening     Status: None   Collection Time: 08/07/19  8:27 PM   Specimen: Nasal Mucosa; Nasopharyngeal  Result Value Ref Range Status   MRSA by PCR NEGATIVE NEGATIVE Final    Comment:        The GeneXpert MRSA Assay (FDA approved for NASAL specimens only), is one component of a comprehensive MRSA colonization surveillance program. It is not intended to diagnose MRSA infection nor to guide or monitor treatment for MRSA infections. Performed at [facility], [place] [address] [place], [place] [address]     Procedures and diagnostic studies:  US RENAL  Result  Date: 08/07/2019 CLINICAL DATA:  Acute on chronic renal failure EXAM: RENAL / URINARY TRACT  ULTRASOUND COMPLETE COMPARISON:  Jun 06, 2017 FINDINGS: Right Kidney: Renal measurements: 11.6 x 5.2 x 6.1 cm = volume: 192 mL . Echogenicity within normal limits. Mild diffuse cortical thinning is seen. There are 2 anechoic cystic lesions seen within the right kidney the largest within the upper pole measuring 4.1 x 3.5 x 3.5 cm with internal septations. Left Kidney: Renal measurements: 12.2 x 5.4 x 6.4 cm = volume: 219 mL. Echogenicity within normal limits. Diffuse cortical thinning is noted. There is an anechoic cyst seen within the left kidney measuring 4.4 x 3.9 x 4.7 cm. Bladder: Decompressed with a suprapubic catheter. Other: None. IMPRESSION: Bilateral simple renal cysts. No hydronephrosis or other acute abnormality Electronically Signed   By: Prudencio Pair M.D.   On: 08/07/2019 22:04   DG Chest Portable 1 View  Result Date: 08/07/2019 CLINICAL DATA:  Shortness of breath, weakness and tremors EXAM: PORTABLE CHEST 1 VIEW COMPARISON:  Radiograph 07/12/2019, CT 07/23/2019 FINDINGS: Nondisplaced lateral eighth rib fracture, possibly acute to subacute with history of recent fall. No other acute or suspicious osseous or soft tissue abnormality. Degenerative changes are present in the imaged spine and shoulders. Atelectatic changes are present in the left lung base with more chronic elevation of the left hemidiaphragm similar to priors. Chronically coarsened interstitial changes are similar to comparison exams. No consolidation, features of edema, pneumothorax, or effusion. Stable cardiomediastinal contours with a borderline enlarged cardiac silhouette and a calcified, tortuous aorta. Right IJ approach Port-A-Cath tip terminates in the lower SVC. Telemetry leads and pacer pads overlie the chest. IMPRESSION: 1. Nondisplaced lateral eighth rib fracture, possibly acute to subacute with history of recent fall. 2. Chronically coarsened interstitial changes and left basilar atelectasis. 3. No other acute  cardiopulmonary abnormalities. 4. Stable cardiomegaly. 5.  Aortic Atherosclerosis (ICD10-I70.0). Electronically Signed   By: Lovena Le M.D.   On: 08/07/2019 16:43   ECHOCARDIOGRAM COMPLETE  Result Date: 08/08/2019    ECHOCARDIOGRAM REPORT   Patient Name:   Gary Eads. Date of Exam: 08/08/2019 Medical Rec #:  [id number]         Height:       69.0 in Accession #:    [id number]        Weight:       230.4 lb Date of Birth:  08-17-1933         BSA:          2.194 m Patient Age:    37 years          BP:           99/49 mmHg Patient Gender: M                 HR:           69 bpm. Exam Location:  Inpatient Procedure: 2D Echo Indications:    palpitations 785.1  History:        Patient has prior history of Echocardiogram examinations, most                 recent 06/10/2017. CAD, Arrythmias:paroxysmal afib,                 Signs/Symptoms:lower extremity edema; Risk Factors:Hypertension,                 Dyslipidemia, Sleep Apnea and Diabetes.  Sonographer:    Johny Chess Referring Phys: [place]  PRATT IMPRESSIONS  1. Left ventricular ejection fraction, by estimation, is 60 to 65%. The left ventricle has normal function. The left ventricle has no regional wall motion abnormalities. Left ventricular diastolic parameters are consistent with Grade I diastolic dysfunction (impaired relaxation).  2. Right ventricular systolic function is normal. The right ventricular size is normal. There is normal pulmonary artery systolic pressure.  3. The mitral valve is normal in structure. No evidence of mitral valve regurgitation. No evidence of mitral stenosis.  4. The aortic valve is normal in structure. Aortic valve regurgitation is not visualized. Mild to moderate aortic valve sclerosis/calcification is present, without any evidence of aortic stenosis.  5. The inferior vena cava is normal in size with greater than 50% respiratory variability, suggesting right atrial pressure of 3 mmHg. FINDINGS  Left Ventricle: Left  ventricular ejection fraction, by estimation, is 60 to 65%. The left ventricle has normal function. The left ventricle has no regional wall motion abnormalities. The left ventricular internal cavity size was normal in size. There is  no left ventricular hypertrophy. Left ventricular diastolic parameters are consistent with Grade I diastolic dysfunction (impaired relaxation). Right Ventricle: The right ventricular size is normal. No increase in right ventricular wall thickness. Right ventricular systolic function is normal. There is normal pulmonary artery systolic pressure. The tricuspid regurgitant velocity is 2.02 m/s, and  with an assumed right atrial pressure of 3 mmHg, the estimated right ventricular systolic pressure is 26.7 mmHg. Left Atrium: Left atrial size was normal in size. Right Atrium: Right atrial size was normal in size. Pericardium: There is no evidence of pericardial effusion. Mitral Valve: The mitral valve is normal in structure. Normal mobility of the mitral valve leaflets. Mild mitral annular calcification. No evidence of mitral valve regurgitation. No evidence of mitral valve stenosis. Tricuspid Valve: The tricuspid valve is normal in structure. Tricuspid valve regurgitation is not demonstrated. No evidence of tricuspid stenosis. Aortic Valve: The aortic valve is normal in structure. Aortic valve regurgitation is not visualized. Mild to moderate aortic valve sclerosis/calcification is present, without any evidence of aortic stenosis. Aortic valve mean gradient measures 10.0 mmHg.  Aortic valve peak gradient measures 17.7 mmHg. Aortic valve area, by VTI measures 1.42 cm. Pulmonic Valve: The pulmonic valve was normal in structure. Pulmonic valve regurgitation is not visualized. No evidence of pulmonic stenosis. Aorta: The aortic root is normal in size and structure. Venous: The inferior vena cava is normal in size with greater than 50% respiratory variability, suggesting right atrial pressure of  3 mmHg. IAS/Shunts: No atrial level shunt detected by color flow Doppler.  LEFT VENTRICLE PLAX 2D LVIDd:         5.10 cm  Diastology LVIDs:         3.20 cm  LV e' lateral:   8.59 cm/s LV PW:         1.10 cm  LV E/e' lateral: 11.0 LV IVS:        1.00 cm  LV e' medial:    7.29 cm/s LVOT diam:     2.10 cm  LV E/e' medial:  12.9 LV SV:         62 LV SV Index:   28 LVOT Area:     3.46 cm  RIGHT VENTRICLE             IVC RV S prime:     12.90 cm/s  IVC diam: 2.30 cm TAPSE (M-mode): 2.0 cm LEFT ATRIUM  Index       RIGHT ATRIUM           Index LA diam:        2.90 cm 1.32 cm/m  RA Area:     14.40 cm LA Vol (A2C):   52.7 ml 24.02 ml/m RA Volume:   31.90 ml  14.54 ml/m LA Vol (A4C):   42.9 ml 19.55 ml/m LA Biplane Vol: 49.7 ml 22.65 ml/m  AORTIC VALVE AV Area (Vmax):    1.46 cm AV Area (Vmean):   1.33 cm AV Area (VTI):     1.42 cm AV Vmax:           210.50 cm/s AV Vmean:          150.000 cm/s AV VTI:            0.436 m AV Peak Grad:      17.7 mmHg AV Mean Grad:      10.0 mmHg LVOT Vmax:         88.55 cm/s LVOT Vmean:        57.550 cm/s LVOT VTI:          0.179 m LVOT/AV VTI ratio: 0.41  AORTA Ao Asc diam: 3.10 cm MITRAL VALVE                TRICUSPID VALVE MV Area (PHT): 2.83 cm     TR Peak grad:   16.3 mmHg MV Decel Time: 268 msec     TR Vmax:        202.00 cm/s MV E velocity: 94.30 cm/s MV A velocity: 119.00 cm/s  SHUNTS MV E/A ratio:  0.79         Systemic VTI:  0.18 m                             Systemic Diam: 2.10 cm Candee Furbish MD Electronically signed by Candee Furbish MD Signature Date/Time: 08/08/2019/11:52:00 AM    Final     Medications:   . adenosine      . atorvastatin  40 mg Oral Daily  . gabapentin  600 mg Oral TID  . insulin aspart  0-15 Units Subcutaneous TID WC  . melatonin  1.5 mg Oral QHS  . metoprolol succinate  25 mg Oral Daily  . pantoprazole  40 mg Oral q AM  . polyethylene glycol  17 g Oral Daily  . potassium chloride SA  20 mEq Oral q AM  . pramipexole  3 mg Oral QPM  .  sodium chloride flush  3 mL Intravenous Q12H  . vitamin B-12  1,000 mcg Oral Daily  . warfarin  5 mg Oral Once per day on Sun Tue Thu   And  . [START ON 08/09/2019] warfarin  2.5 mg Oral Once per day on Mon Wed Fri Sat  . Warfarin - Physician Dosing Inpatient   Does not apply q1600   Continuous Infusions: . sodium chloride    . sodium chloride       LOS: 1 day   Geradine Girt  [facility]   How to contact the [facility] Attending or Consulting provider [place] or covering provider during after hours [place], for this patient?  1. Check the care team in [facility] and look for a) attending/consulting TR[facility] provider listed and b) the [facility] team listed 2. Log into [email or web address] and use [place]'s universal password to access. If you do not  have the password, please contact the hospital operator. 3. Locate the [facility] provider you are looking for under [facility] and page to a number that you can be directly reached. 4. If you still have difficulty reaching the provider, please page the [facility] (Director on Call) for the Hospitalists listed on amion for assistance.  08/08/2019, 2:15 PM

## 2019-08-08 NOTE — Progress Notes (Signed)
   08/08/19 0006  Assess: MEWS Score  BP (!) 88/72  Pulse Rate (!) 158  ECG Heart Rate (!) 158  Resp 14  Assess: MEWS Score  MEWS Temp 0  MEWS Systolic 1  MEWS Pulse 3  MEWS RR 0  MEWS LOC 0  MEWS Score 4  MEWS Score Color Red  Assess: if the MEWS score is Yellow or Red  Were vital signs taken at a resting state? Yes  Focused Assessment Change from prior assessment (see assessment flowsheet)  Early Detection of Sepsis Score *See Row Information* Low  MEWS guidelines implemented *See Row Information* Yes  Treat  MEWS Interventions Administered prn meds/treatments;Escalated (See documentation below);Other (Comment)  Pain Scale 0-10  Pain Score 0  Take Vital Signs  Increase Vital Sign Frequency  Red: Q 1hr X 4 then Q 4hr X 4, if remains red, continue Q 4hrs  Escalate  MEWS: Escalate Red: discuss with charge nurse/RN and provider, consider discussing with RRT  Notify: Charge Nurse/RN  Name of Charge Nurse/RN Notified Janett Billow  Date Charge Nurse/RN Notified 08/08/19  Time Charge Nurse/RN Notified 2359  Notify: Provider  Provider Name/Title Charissa Bash, Cardiology Kennon Holter, [facility])  Date Provider Notified 08/08/19  Time Provider Notified 0001  Notification Type Page  Notification Reason Change in status  Response See new orders  Date of Provider Response 08/08/19  Time of Provider Response 0010 (secure chat response from [facility] with [place])  Notify: Rapid Response  Name of Rapid Response RN Notified Juda, RN  Date Rapid Response Notified 08/08/19  Time Rapid Response Notified 2359  Document  Patient Outcome Stabilized after interventions

## 2019-08-08 NOTE — Significant Event (Addendum)
Rapid Response Event Note  Reason for Call : Called d/t sustained vtach without loss of pulse.  Pt here with cardiac arrthymia/SVT in ED. Pt was given adenosine which terminated the SVT.    Initial Focused Assessment:  Pt laying in bed, alert and oriented, in no distress. Pt denies chest pain and SOB but does say he is lightheaded. Lungs clear and diminished. Skin warm and dry. EKG done-rhythm is SVT. HR-154(SVT), BP-88/72, RR-14, SpO2-98% on RA.    Interventions: 2L [place] EKG-wide QRS tachycardia 250cc NS bolus Cardiology on way to see pt: on arrival, 2.5mg  metoprolol given with no break in HR. 6mg  adenosine given at 0033. Pt converted to SR-70s.  Plan of Care: Pt is now back in SR. Continue to monitor pt closely. Call RRT if further assistance needed.   Update: 0540-pt HR back in SVT-150s. On my arrival to bedside, he had convert back to SR-80s with no interventions.  Event Summary:  MD Notified: Kennon Holter, [facility] NP and [place](cards) notified by bedside RN  Call [place]  Dillard Essex, RN

## 2019-08-08 NOTE — Plan of Care (Signed)

## 2019-08-08 NOTE — Progress Notes (Signed)
Pt HR increased with wide complex SVT rhythm in 150s. MD with [facility] and cards paged. Cardiology came to pts bedside and new order placed and implemented. Rapid also at bedside. Pt was asymtomatic with no complaints. Pt VS stable. Will continue to monitor.

## 2019-08-08 NOTE — Progress Notes (Signed)
PT Cancellation Note  Patient Details Name: Gary Yates. MRN: [id number] DOB: 12/22/1933   Cancelled Treatment:    Reason Eval/Treat Not Completed: Patient not medically ready. Spoke with Dr. Eliseo Squires. Wants legs to remain elevated today. Will see tomorrow.    [place] 08/08/2019, 3:00 PM [place] Pager [phone] Office [phone]

## 2019-08-08 NOTE — Progress Notes (Signed)
  Echocardiogram 2D Echocardiogram has been performed.  Johny Chess 08/08/2019, 11:38 AM

## 2019-08-08 NOTE — Consult Note (Signed)
Cardiology Consultation:   Patient ID: Gary Yates. MRN: [id number]; DOB: 16-Jan-1933  Admit date: 08/07/2019 Date of Consult: 08/08/2019  Primary Care Provider: [name], MD [facility] Cardiologist: Lauree Chandler, MD  [facility] Electrophysiologist:  None    Patient Profile:   Gary Vanderveen. is a 86 y.o. male with a hx of PAF and CAD who is being seen today for the evaluation of SVT at the request of Dr. Eliseo Squires.  History of Present Illness:   Mr. Chavira has a h/o HTN, dylipidemia, CAD,s /p PCI of the RCA in 2004 and preserved LV function. He developed worsening restless legs yesterday morning and proceeded to the ED where his legs improved but he was noted to be in SVT (wide QRS tachy) which was treated with IV adenosine with termination. He had another episode last night. In the past he has been on toprol. He also has a h/o testicular CA (non-hodkins) and is s/p chemotherapy. He denies palpitations or any sensation that his heart is racing. He notes that there are times when he will suddenly feel dizzy and this will pass after several seconds. No frank syncope.    Past Medical History:  Diagnosis Date  . Anemia in chronic renal disease 05/07/2017  . Anxiety   . Atrial fibrillation ([place])   . COPD (chronic obstructive pulmonary disease) ([place])    pt. denies  . Coronary artery disease    a. h/o Overlapping stents RCA;  b. 06/2011 Cath: patent stents, nonobs dzs, NL EF.  . Diabetic peripheral neuropathy ([place])   . Diffuse non-Hodgkin's lymphoma of testis ([place]) 09/28/2015  . DM (diabetes mellitus) ([place])    Type 2, peripheral neuropathy.  . Dyspnea    with exertion  . Dysrhythmia   . GERD (gastroesophageal reflux disease)   . Headache   . History of bronchitis   . History of kidney stones   . History of radiation therapy 02/19/16 - 03/13/16   Testis/Scrotum: 32.4 Gy in 18 fractions  . History of radiation therapy 08/07/16-08/20/16   left adrenal gland mass  treated to 30 Gy in 10 fractions  . Hyperlipidemia   . Hypertension   . Iron deficiency anemia due to chronic blood loss 08/08/2017  . Low testosterone   . Nephrolithiasis   . OSA (obstructive sleep apnea) 11/26/2017  . Osteoarthritis    shoulder  . Restless leg   . SVT (supraventricular tachycardia) ([place])   . Urinary frequency   . Wears partial dentures    upper and lower    Past Surgical History:  Procedure Laterality Date  . APPENDECTOMY    . [place]  . CARDIAC CATHETERIZATION  01/2013  . CATARACT EXTRACTION, BILATERAL    . CHOLECYSTECTOMY    . COLONOSCOPY    . CORONARY ANGIOPLASTY  2004  . CYSTOSCOPY N/A 08/18/2017   Procedure: CYSTOSCOPY WITH FULGURATION AND SUPRA PUBIC TUBE PLACEMENT;  Surgeon: Kathie Rhodes, MD;  Location: [facility] ORS;  Service: Urology;  Laterality: N/A;  . EYE SURGERY Bilateral    cataracts  . IR GENERIC HISTORICAL  10/05/2015   IR US GUIDE VASC ACCESS RIGHT 10/05/2015 Marybelle Killings, MD [facility]-INTERV RAD  . IR GENERIC HISTORICAL  10/05/2015   IR FLUORO GUIDE PORT INSERTION RIGHT 10/05/2015 Marybelle Killings, MD [facility]INTER[facility]  . LEFT HEART CATHETERIZATION WITH CORONARY ANGIOGRAM N/A 06/18/2011   Procedure: LEFT HEART CATHETERIZATION WITH CORONARY ANGIOGRAM;  Surgeon: Peter M Martinique, MD;  Location: [facility]  CATH LAB;  Service: Cardiovascular;  Laterality: N/A;  . LEFT HEART CATHETERIZATION WITH CORONARY ANGIOGRAM N/A 01/27/2013   Procedure: LEFT HEART CATHETERIZATION WITH CORONARY ANGIOGRAM;  Surgeon: Burnell Blanks, MD;  Location: [facility] CATH LAB;  Service: Cardiovascular;  Laterality: N/A;  . LUMBAR LAMINECTOMY/DECOMPRESSION MICRODISCECTOMY N/A 02/07/2015   Procedure: Lumbar three-Sacral one Decompression;  Surgeon: Kevan Ny Ditty, MD;  Location: [place] NEURO ORS;  Service: Neurosurgery;  Laterality: N/A;  L3 to S1 Decompression  . MULTIPLE TOOTH EXTRACTIONS    . ORCHIECTOMY Right 09/01/2015   Procedure: RIGHT ORCHIECTOMY;  Surgeon: Kathie Rhodes, MD;  Location:  [facility] ORS;  Service: Urology;  Laterality: Right;  . port a cath in place     . ROTATOR CUFF REPAIR Left      Home Medications:  Prior to Admission medications   Medication Sig Start Date End Date Taking? Authorizing Provider  acetaminophen (TYLENOL) 500 MG tablet Take 1,000 mg by mouth every 6 (six) hours as needed (for pain/fever/headaches.).    Yes [provider]  albuterol (VENTOLIN HFA) 108 (90 Base) MCG/ACT inhaler Inhale 2 puffs into the lungs every 4 (four) hours as needed for wheezing or shortness of breath. 05/24/19  Yes Laurey Morale, MD  atorvastatin (LIPITOR) 40 MG tablet TAKE 1 TABLET BY MOUTH EVERY DAY Patient taking differently: Take 40 mg by mouth daily.  06/08/19  Yes [name], Alinda Sierras, MD  docusate sodium (COLACE) 50 MG capsule Take 1 capsule (50 mg total) by mouth 2 (two) times daily as needed (for constipation). 04/22/16  Yes [name], Alinda Sierras, MD  furosemide (LASIX) 40 MG tablet TAKE 1 TABLET BY MOUTH EVERY OTHER DAY. Patient taking differently: Take 40 mg by mouth 2 (two) times daily.  07/26/19  Yes [name], Alinda Sierras, MD  gabapentin (NEURONTIN) 300 MG capsule TAKE 2 CAPSULES BY MOUTH 3 TIMES A DAY Patient taking differently: Take 900 mg by mouth 2 (two) times daily.  08/06/19  Yes [name], Alinda Sierras, MD  glucose blood (ACCU-CHEK AVIVA PLUS) test strip 1 each by Other route 3 (three) times daily. for testing 08/25/18  Yes [name], Alinda Sierras, MD  KLOR-CON M20 20 MEQ tablet TAKE 1 TABLET BY MOUTH EVERY DAY Patient taking differently: Take 20 mEq by mouth in the morning.  07/19/19  Yes [name], Alinda Sierras, MD  Melatonin 1 MG TABS Take 1 tablet (1 mg total) by mouth at bedtime. 08/14/18  Yes [name], Alinda Sierras, MD  metFORMIN (GLUCOPHAGE) 500 MG tablet TAKE 1 TABLET BY MOUTH TWICE A DAY WITH MEALS Patient taking differently: Take 500 mg by mouth 2 (two) times daily with a meal.  08/06/19  Yes [name], Alinda Sierras, MD  metoprolol succinate (TOPROL-XL) 25 MG 24 hr tablet Take 1  tablet (25 mg total) by mouth daily. 11/24/18  Yes Burnell Blanks, MD  nystatin (MYCOSTATIN/NYSTOP) powder APPLY TO AFFECTED AREA 4 TIMES A DAY Patient taking differently: Apply 1 application topically 4 (four) times daily as needed (to affected areas).  04/03/19  Yes [name], Alinda Sierras, MD  nystatin-triamcinolone ointment [facility]) Apply 1 application topically 2 (two) times daily. 12/26/17  Yes [name], Alinda Sierras, MD  ondansetron (ZOFRAN ODT) 4 MG disintegrating tablet 4mg  ODT q4 hours prn nausea/vomit Patient taking differently: Take 4 mg by mouth every 8 (eight) hours as needed for nausea or vomiting.  07/23/19  Yes Drenda Freeze, MD  pantoprazole (PROTONIX) 40 MG tablet TAKE 1 TABLET BY MOUTH EVERY DAY Patient taking differently: Take 40 mg  by mouth in the morning.  11/30/18  Yes [name], Alinda Sierras, MD  polyethylene glycol (MIRALAX / GLYCOLAX) packet Take 17 g by mouth daily. 06/20/17  Yes Patrecia Pour, Christean Grief, MD  pramipexole (MIRAPEX) 1.5 MG tablet TAKE 2 TABLETS BY MOUTH EVERY EVENING Patient taking differently: Take 3 mg by mouth every evening.  04/19/19  Yes [name], Alinda Sierras, MD  warfarin (COUMADIN) 5 MG tablet Take 1/2 tablet daily except take 1 tablet on Sun Tues and Thurs or Take as directed by anticoagulation clinic Patient taking differently: Take 2.5-5 mg by mouth See admin instructions. Take 2.5mg   Tablet by mouth daily except on Sun Tues and Thurs Take 5 mg tablet by mouth. 07/26/19  Yes [name], Alinda Sierras, MD  lidocaine-prilocaine (EMLA) cream Apply 1 application topically as needed. Apply to [facility] one hour before procedure. Patient not taking: Reported on 08/07/2019 06/29/19   Volanda Napoleon, MD  methylPREDNISolone (MEDROL DOSEPAK) 4 MG TBPK tablet As directed Patient not taking: Reported on 08/07/2019 05/24/19   Laurey Morale, MD  nitroGLYCERIN (NITROSTAT) 0.4 MG SL tablet Place 1 tablet (0.4 mg total) under the tongue every 5 (five) minutes as needed. Chest pain Patient  taking differently: Place 0.4 mg under the tongue every 5 (five) minutes as needed for chest pain.  01/20/14   Burnell Blanks, MD  metoprolol (LOPRESSOR) 50 MG tablet  06/08/10 03/27/11  [provider]  spironolactone (ALDACTONE) 25 MG tablet  01/14/11 03/27/11  [provider]    Inpatient Medications: Scheduled Meds: . adenosine (ADENOCARD) IV  6 mg Intravenous Once  . adenosine (ADENOCARD) IV  6 mg Intravenous Once  . adenosine      . atorvastatin  40 mg Oral Daily  . gabapentin  600 mg Oral TID  . insulin aspart  0-15 Units Subcutaneous TID WC  . melatonin  1.5 mg Oral QHS  . metoprolol succinate  25 mg Oral Daily  . pantoprazole  40 mg Oral q AM  . polyethylene glycol  17 g Oral Daily  . potassium chloride SA  20 mEq Oral q AM  . pramipexole  3 mg Oral QPM  . sodium chloride flush  3 mL Intravenous Q12H  . [START ON 08/10/2019] warfarin  5 mg Oral Once per day on Tue Thu   And  . warfarin  2.5 mg Oral Once per day on Mon Wed Fri Sat  . Warfarin - Physician Dosing Inpatient   Does not apply q1600   Continuous Infusions: . sodium chloride    . magnesium sulfate bolus IVPB 4 g (08/08/19 0645)  . sodium chloride     PRN Meds: sodium chloride, acetaminophen, albuterol, docusate sodium, metoprolol tartrate, nitroGLYCERIN, promethazine, sodium chloride, sodium chloride flush  Allergies:    Allergies  Allergen Reactions  . Latex Other (See Comments)    Reddens the skin  . Ace Inhibitors Cough  . Codeine Nausea Only and Rash       . Penicillins Rash    Childhood allergy Has patient had a PCN reaction causing immediate rash, facial/tongue/throat swelling, SOB or lightheadedness with hypotension: Yes Has patient had a PCN reaction causing severe rash involving mucus membranes or skin necrosis: Yes Has patient had a PCN reaction that required hospitalization No Has patient had a PCN reaction occurring within the last 10 years: No If all of the above answers  are "NO", then may proceed with Cephalosporin use.     Social History:   Social History  Socioeconomic History  . Marital status: Married    Spouse name: Not on file  . Number of children: 3  . Years of education: Not on file  . Highest education level: Not on file  Occupational History  . Occupation: Retired    Fish farm manager: RETIRED  Tobacco Use  . Smoking status: Former Smoker    Packs/day: 1.50    Years: 20.00    Pack years: 30.00    Types: Cigarettes    Quit date: 04/05/1978    Years since quitting: 41.3  . Smokeless tobacco: Never Used  Vaping Use  . Vaping Use: Never used  Substance and Sexual Activity  . Alcohol use: No  . Drug use: No  . Sexual activity: Yes    Birth control/protection: None    Comment: Married  Other Topics Concern  . Not on file  Social History Narrative  . Not on file   Social Determinants of Health   Financial Resource Strain:   . Difficulty of Paying Living Expenses:   Food Insecurity:   . Worried About Charity fundraiser in the Last Year:   . Arboriculturist in the Last Year:   Transportation Needs:   . Film/video editor (Medical):   Marland Kitchen Lack of Transportation (Non-Medical):   Physical Activity:   . Days of Exercise per Week:   . Minutes of Exercise per Session:   Stress:   . Feeling of Stress :   Social Connections:   . Frequency of Communication with Friends and Family:   . Frequency of Social Gatherings with Friends and Family:   . Attends Religious Services:   . Active Member of Clubs or Organizations:   . Attends Archivist Meetings:   Marland Kitchen Marital Status:   Intimate Partner Violence:   . Fear of Current or Ex-Partner:   . Emotionally Abused:   Marland Kitchen Physically Abused:   . Sexually Abused:     Family History:    Family History  Problem Relation Age of Onset  . Alzheimer's disease Mother   . Heart disease Mother   . Heart disease Father   . Migraines Father   . Ulcers Father   . Prostate cancer Brother     . Coronary artery disease Other        Male 1st degree relative <50  . Coronary artery disease Other        male 1st degree relative <60  . Heart disease Sister   . Obesity Sister        Morbid  . Arthritis Sister   . Heart disease Brother   . Arthritis Brother   . Sleep apnea Son   . Obesity Son   . Migraines Daughter   . Thyroid disease Daughter      ROS:  Please see the history of present illness.   All other ROS reviewed and negative.     Physical Exam/Data:   Vitals:   08/08/19 0300 08/08/19 0409 08/08/19 0558 08/08/19 0805  BP: (!) 96/56 (!) 98/54 (!) 110/62 (!) 99/49  Pulse: 77 72 79 80  Resp: 14 14 18 14   Temp: 98 F (36.7 C) 97.9 F (36.6 C) 97.6 F (36.4 C) 97.6 F (36.4 C)  TempSrc: Oral Oral Oral Oral  SpO2: 97% 99% 98% 97%  Weight:      Height:        Intake/Output Summary (Last 24 hours) at 08/08/2019 0836 Last data filed at 08/08/2019 0800 Gross per  24 hour  Intake 692.4 ml  Output 225 ml  Net 467.4 ml   Last 3 Weights September 03, 2019 09-03-19 08/02/2019  Weight (lbs) 230 lb 6.1 oz 232 lb 237 lb 1.6 oz  Weight (kg) 104.5 kg 105.235 kg 107.548 kg     Body mass index is 34.02 kg/m.  General:  Well nourished, well developed, in no acute distress HEENT: normal Lymph: no adenopathy Neck: no JVD, indwelling port a cath Endocrine:  No thryomegaly Vascular: No carotid bruits; FA pulses 2+ bilaterally without bruits  Cardiac:  normal S1, S2; RRR; 1/6 systolic murmur at RUSB Lungs:  clear to auscultation bilaterally, no wheezing, rhonchi or rales  Abd: soft, nontender, no hepatomegaly  Ext: 2-3+peripheral edema bilaterally with some erythema Musculoskeletal:  No deformities, BUE and BLE strength normal and equal Skin: warm and dry  Neuro:  CNs 2-12 intact, no focal abnormalities noted Psych:  Normal affect   EKG:  The EKG was personally reviewed and demonstrates:  NSR and SVT with RBBB Telemetry:  Telemetry was personally reviewed and demonstrates:   nsr and SVT  Relevant CV Studies: none  Laboratory Data:  High Sensitivity Troponin:   Recent Labs  Lab 07/23/19 1858 07/23/19 2015 2019-09-03 1642 09-03-19 1817  TROPONINIHS 4 5 7 9      Chemistry Recent Labs  Lab 08/02/19 1354 09/03/19 1121 08/08/19 0534  NA 138 135 136  K 4.0 3.6 3.7  CL 102 99 102  CO2 26 24 25   GLUCOSE 93 164* 158*  BUN 21 24* 21  CREATININE 1.32* 1.88* 1.46*  CALCIUM 8.6 8.4* 8.1*  GFRNONAA  --  32* 44*  GFRAA  --  37* 50*  ANIONGAP  --  12 9    Recent Labs  Lab Sep 03, 2019 1817 08/08/19 0534  PROT 6.4* 5.5*  ALBUMIN 2.9* 2.5*  AST 12* 13*  ALT 8 6  ALKPHOS 76 69  BILITOT 0.5 0.6   Hematology Recent Labs  Lab 2019-09-03 1121 08/08/19 0534  WBC 6.4 6.0  RBC 3.14* 2.75*  HGB 9.6* 8.5*  HCT 30.9* 26.7*  MCV 98.4 97.1  MCH 30.6 30.9  MCHC 31.1 31.8  RDW 15.1 15.1  PLT 170 141*   BNP Recent Labs  Lab 03-Sep-2019 1642  BNP 72.5    DDimer No results for input(s): DDIMER in the last 168 hours.   Radiology/Studies:  US RENAL  Result Date: Sep 03, 2019 CLINICAL DATA:  Acute on chronic renal failure EXAM: RENAL / URINARY TRACT ULTRASOUND COMPLETE COMPARISON:  Jun 06, 2017 FINDINGS: Right Kidney: Renal measurements: 11.6 x 5.2 x 6.1 cm = volume: 192 mL . Echogenicity within normal limits. Mild diffuse cortical thinning is seen. There are 2 anechoic cystic lesions seen within the right kidney the largest within the upper pole measuring 4.1 x 3.5 x 3.5 cm with internal septations. Left Kidney: Renal measurements: 12.2 x 5.4 x 6.4 cm = volume: 219 mL. Echogenicity within normal limits. Diffuse cortical thinning is noted. There is an anechoic cyst seen within the left kidney measuring 4.4 x 3.9 x 4.7 cm. Bladder: Decompressed with a suprapubic catheter. Other: None. IMPRESSION: Bilateral simple renal cysts. No hydronephrosis or other acute abnormality Electronically Signed   By: Prudencio Pair M.D.   On: 03-Sep-2019 22:04   DG Chest Portable 1  View  Result Date: 2019/09/03 CLINICAL DATA:  Shortness of breath, weakness and tremors EXAM: PORTABLE CHEST 1 VIEW COMPARISON:  Radiograph 07/12/2019, CT 07/23/2019 FINDINGS: Nondisplaced lateral eighth rib fracture, possibly acute to  subacute with history of recent fall. No other acute or suspicious osseous or soft tissue abnormality. Degenerative changes are present in the imaged spine and shoulders. Atelectatic changes are present in the left lung base with more chronic elevation of the left hemidiaphragm similar to priors. Chronically coarsened interstitial changes are similar to comparison exams. No consolidation, features of edema, pneumothorax, or effusion. Stable cardiomediastinal contours with a borderline enlarged cardiac silhouette and a calcified, tortuous aorta. Right IJ approach Port-A-Cath tip terminates in the lower SVC. Telemetry leads and pacer pads overlie the chest. IMPRESSION: 1. Nondisplaced lateral eighth rib fracture, possibly acute to subacute with history of recent fall. 2. Chronically coarsened interstitial changes and left basilar atelectasis. 3. No other acute cardiopulmonary abnormalities. 4. Stable cardiomegaly. 5.  Aortic Atherosclerosis (ICD10-I70.0). Electronically Signed   By: Lovena Le M.D.   On: 08/07/2019 16:43   { Assessment and Plan:   1. SVT - the patient is minimally if at all symptomatic. His brief spells of lightheadedness may or may not be related to SVT. I discussed the treatment options in detail. As long as he is not symptomatic and his SVT is quiet, I would suggest he continue his low dose beta blocker and undergo watchful waiting. If he has more SVT today and he can correlate symptoms with SVT the catheter ablation would be indicated. If however, he has no more SVT, he could be discharged home tomorrow morning or late this afternoon with followup with [facility]. [facility] 2. Lower extremity edema - he some erythema. I would be inclined to give some IV lasix, 80 mg,  once. If he has any evidence of infection, I.e fever or elevated wbc, then consider adding ancef or keflex.     For questions or updates, please contact [place] Please consult [email or web address] for contact info under    Signed, Cristopher Peru, MD  08/08/2019 8:36 AM

## 2019-08-08 NOTE — Progress Notes (Signed)
Pt HR increased to 140s SVT. Rapid and Cards paged. Pt converted himself back into SR with HR in the 70s. Will continue to monitor.

## 2019-08-09 ENCOUNTER — Inpatient Hospital Stay (HOSPITAL_COMMUNITY): Payer: Medicare Other

## 2019-08-09 ENCOUNTER — Telehealth: Payer: Self-pay | Admitting: *Deleted

## 2019-08-09 DIAGNOSIS — I739 Peripheral vascular disease, unspecified: Secondary | ICD-10-CM

## 2019-08-09 LAB — GLUCOSE, CAPILLARY
Glucose-Capillary: 125 mg/dL — ABNORMAL HIGH (ref 70–99)
Glucose-Capillary: 129 mg/dL — ABNORMAL HIGH (ref 70–99)
Glucose-Capillary: 126 mg/dL — ABNORMAL HIGH (ref 70–99)
Glucose-Capillary: 145 mg/dL — ABNORMAL HIGH (ref 70–99)

## 2019-08-09 LAB — BASIC METABOLIC PANEL
Anion gap: 8 (ref 5–15)
BUN: 16 mg/dL (ref 8–23)
CO2: 24 mmol/L (ref 22–32)
Calcium: 8.6 mg/dL — ABNORMAL LOW (ref 8.9–10.3)
Chloride: 105 mmol/L (ref 98–111)
Creatinine, Ser: 1.27 mg/dL — ABNORMAL HIGH (ref 0.61–1.24)
GFR calc Af Amer: 60 mL/min — ABNORMAL LOW (ref >60)
GFR calc non Af Amer: 52 mL/min — ABNORMAL LOW (ref >60)
Glucose, Bld: 146 mg/dL — ABNORMAL HIGH (ref 70–99)
Potassium: 4.1 mmol/L (ref 3.5–5.1)
Sodium: 137 mmol/L (ref 135–145)

## 2019-08-09 LAB — CBC
HCT: 28.2 % — ABNORMAL LOW (ref 39.0–52.0)
Hemoglobin: 9 g/dL — ABNORMAL LOW (ref 13.0–17.0)
MCH: 31.3 pg (ref 26.0–34.0)
MCHC: 31.9 g/dL (ref 30.0–36.0)
MCV: 97.9 fL (ref 80.0–100.0)
Platelets: 137 10*3/uL — ABNORMAL LOW (ref 150–400)
RBC: 2.88 MIL/uL — ABNORMAL LOW (ref 4.22–5.81)
RDW: 15 % (ref 11.5–15.5)
WBC: 5 10*3/uL (ref 4.0–10.5)
nRBC: 0 % (ref 0.0–0.2)

## 2019-08-09 LAB — PROTIME-INR
INR: 1.7 — ABNORMAL HIGH (ref 0.8–1.2)
Prothrombin Time: 19.6 seconds — ABNORMAL HIGH (ref 11.4–15.2)

## 2019-08-09 LAB — MAGNESIUM: Magnesium: 2.2 mg/dL (ref 1.7–2.4)

## 2019-08-09 LAB — HEMOGLOBIN A1C
Hgb A1c MFr Bld: 6.8 % — ABNORMAL HIGH (ref 4.8–5.6)
Mean Plasma Glucose: 148.46 mg/dL

## 2019-08-09 MED ORDER — FUROSEMIDE 40 MG PO TABS
40.0000 mg | ORAL_TABLET | Freq: Every day | ORAL | Status: DC
  Administered 2019-08-10 – 2019-08-11 (×2): 40 mg via ORAL
  Filled 2019-08-09 (×2): qty 1

## 2019-08-09 MED ORDER — ACETAMINOPHEN 500 MG PO TABS
1000.0000 mg | ORAL_TABLET | Freq: Three times a day (TID) | ORAL | Status: DC
  Administered 2019-08-09 – 2019-08-11 (×7): 1000 mg via ORAL
  Filled 2019-08-09 (×8): qty 2

## 2019-08-09 MED ORDER — CHLORHEXIDINE GLUCONATE CLOTH 2 % EX PADS: 6.0000 | MEDICATED_PAD | Freq: Every day | CUTANEOUS | Status: DC

## 2019-08-09 MED ORDER — FUROSEMIDE 40 MG PO TABS: 40.0000 mg | ORAL_TABLET | ORAL | Status: DC

## 2019-08-09 MED ORDER — WARFARIN SODIUM 4 MG PO TABS
4.0000 mg | ORAL_TABLET | Freq: Once | ORAL | Status: AC
  Administered 2019-08-09: 4 mg via ORAL
  Filled 2019-08-09: qty 1

## 2019-08-09 MED ORDER — WARFARIN - PHARMACIST DOSING INPATIENT: Freq: Every day | Status: DC

## 2019-08-09 NOTE — Telephone Encounter (Signed)
fyi

## 2019-08-09 NOTE — Evaluation (Signed)
Physical Therapy Evaluation Patient Details Name: Gary Yates. MRN: [id number] DOB: 1933/06/30 Today's Date: 08/09/2019   History of Present Illness  Pt adm with cardiac arrthymia/SVT. Pt with PMH - afib, copd, cad, non-Hodgkin's lymphoma in reission, htn, cad osa, DM, recent lt rib fx's, obesity  Clinical Impression  Pt presents to PT with decr mobility and history of falls. Pt can benefit from PT to maximize independence and reduce fall risk. Pt reports he has children that can help him at home and that he is considering having someone come in and help at home. He is currently declining the idea of ST-SNF. Since he is declining SNF would maximize home health services.      Follow Up Recommendations Home health PT;Supervision for mobility/OOB (pt declining SNF)    Equipment Recommendations  None recommended by PT    Recommendations for Other Services       Precautions / Restrictions Precautions Precautions: Fall      Mobility  Bed Mobility Overal bed mobility: Needs Assistance Bed Mobility: Supine to Sit     Supine to sit: Min assist     General bed mobility comments: Assist to bring legs off of bed and elevate trunk into sitting  Transfers Overall transfer level: Needs assistance Equipment used: 4-wheeled walker Transfers: Sit to/from Stand Sit to Stand: Min assist         General transfer comment: Assist to bring hips up and for balance  Ambulation/Gait Ambulation/Gait assistance: Min assist Gait Distance (Feet): 100 Feet Assistive device: 4-wheeled walker Gait Pattern/deviations: Step-through pattern;Decreased stride length;Trunk flexed;Wide base of support Gait velocity: decr Gait velocity interpretation: 1.31 - 2.62 ft/sec, indicative of limited community ambulator General Gait Details: Assist for balance.   Stairs            Wheelchair Mobility    Modified Rankin (Stroke Patients Only)       Balance Overall balance assessment: Needs  assistance Sitting-balance support: No upper extremity supported;Feet supported Sitting balance-Leahy Scale: Fair     Standing balance support: Single extremity supported;During functional activity Standing balance-Leahy Scale: Poor Standing balance comment: rollator and min guard for static standing                             Pertinent Vitals/Pain Pain Assessment: 0-10 Pain Score: 8  Pain Location: feet Pain Descriptors / Indicators: Shooting (intermittent)    Home Living Family/patient expects to be discharged to:: Private residence Living Arrangements: Spouse/significant other Available Help at Discharge: Family;Available 24 hours/day Type of Home: House Home Access: Stairs to enter Entrance Stairs-Rails: Right Entrance Stairs-Number of Steps: 2 Home Layout: Able to live on main level with bedroom/bathroom;Laundry or work area in basement;Two level Home Equipment: Walker - 4 wheels      Prior Function Level of Independence: Independent with assistive device(s)         Comments: Pt with history of falls     Hand Dominance   Dominant Hand: Right    Extremity/Trunk Assessment   Upper Extremity Assessment Upper Extremity Assessment: Defer to OT evaluation    Lower Extremity Assessment Lower Extremity Assessment: Generalized weakness (significant edema)       Communication   Communication: No difficulties  Cognition Arousal/Alertness: Awake/alert Behavior During Therapy: WFL for tasks assessed/performed Overall Cognitive Status: Within Functional Limits for tasks assessed  General Comments      Exercises     Assessment/Plan    PT Assessment Patient needs continued PT services  PT Problem List Decreased strength;Decreased activity tolerance;Decreased balance;Decreased mobility;Obesity       PT Treatment Interventions      PT Goals (Current goals can be found in the Care Plan  section)  Acute Rehab PT Goals Patient Stated Goal: return home PT Goal Formulation: With patient Time For Goal Achievement: 08/23/19 Potential to Achieve Goals: Good    Frequency Min 3X/week   Barriers to discharge Inaccessible home environment;Decreased caregiver support Stairs to enter and wife elderly and not in good health.    Co-evaluation               AM-PAC PT "6 Clicks" Mobility  Outcome Measure Help needed turning from your back to your side while in a flat bed without using bedrails?: A Little Help needed moving from lying on your back to sitting on the side of a flat bed without using bedrails?: A Little Help needed moving to and from a bed to a chair (including a wheelchair)?: A Little Help needed standing up from a chair using your arms (e.g., wheelchair or bedside chair)?: A Little Help needed to walk in hospital room?: A Little Help needed climbing 3-5 steps with a railing? : A Little 6 Click Score: 18    End of Session Equipment Utilized During Treatment: Gait belt Activity Tolerance: Patient tolerated treatment well Patient left: in chair;with call bell/phone within reach;with chair alarm set Nurse Communication: Mobility status PT Visit Diagnosis: Unsteadiness on feet (R26.81);Other abnormalities of gait and mobility (R26.89);History of falling (Z91.81)    Time: 7948-0165 PT Time Calculation (min) (ACUTE ONLY): 20 min   Charges:   PT Evaluation $PT Eval Moderate Complexity: [place] Pager [phone] Office [place] 08/09/2019, 3:37 PM

## 2019-08-09 NOTE — Plan of Care (Signed)

## 2019-08-09 NOTE — Telephone Encounter (Signed)
Patient called nurse triage line on : 08/07/2019 at 7:54:39 AM Dr. increased his diuretic for swelling in his legs. Since the dosed was upped he is not putting out as much urine when on the lower dose. Legs are still swollen, cant put on shoes or socks. --Caller reports that his lasix dose was recently increased to 80mg  two days ago. Bilateral leg swelling to knees, right leg is slightly more swollen than left leg. Right leg pain is with Standing. Patient was advised to see PCP within 4 hours and referred to [facility]. Patient currently admitted to [facility] since 08/07/2019.

## 2019-08-09 NOTE — Progress Notes (Signed)
ANTICOAGULATION CONSULT NOTE - Initial Consult  Pharmacy Consult for warfarin Indication: atrial fibrillation  Allergies  Allergen Reactions  . Latex Other (See Comments)    Reddens the skin  . Ace Inhibitors Cough  . Codeine Nausea Only and Rash       . Penicillins Rash    Childhood allergy Has patient had a PCN reaction causing immediate rash, facial/tongue/throat swelling, SOB or lightheadedness with hypotension: Yes Has patient had a PCN reaction causing severe rash involving mucus membranes or skin necrosis: Yes Has patient had a PCN reaction that required hospitalization No Has patient had a PCN reaction occurring within the last 10 years: No If all of the above answers are "NO", then may proceed with Cephalosporin use.     Patient Measurements: Height: 5\' 9"  (175.3 cm) Weight: (!) 108 kg (238 lb 1.6 oz) IBW/kg (Calculated) : 70.7  Vital Signs: Temp: 98.3 F (36.8 C) (08/02 1102) Temp Source: Oral (08/02 1102) BP: 115/70 (08/02 1102) Pulse Rate: 73 (08/02 1102)  Labs: Recent Labs    08/07/19 1121 08/07/19 1121 08/07/19 1642 08/07/19 1817 08/08/19 0534 08/08/19 1454 08/09/19 0119  HGB 9.6*   < >  --   --  8.5*  --  9.0*  HCT 30.9*  --   --   --  26.7*  --  28.2*  PLT 170  --   --   --  141*  --  137*  LABPROT  --   --  19.5*  --  20.0*  --  19.6*  INR  --   --  1.7*  --  1.8*  --  1.7*  CREATININE 1.88*  --   --   --  1.46*  --  1.27*  CKTOTAL  --   --   --   --   --  68  --   TROPONINIHS  --   --  7 9  --   --   --    < > = values in this interval not displayed.    Estimated Creatinine Clearance: 52.4 mL/min (A) (by C-G formula based on SCr of 1.27 mg/dL (H)).   Medical History: Past Medical History:  Diagnosis Date  . Anemia in chronic renal disease 05/07/2017  . Anxiety   . Atrial fibrillation ([place])   . COPD (chronic obstructive pulmonary disease) ([place])    pt. denies  . Coronary artery disease    a. h/o Overlapping stents RCA;  b. 06/2011 Cath:  patent stents, nonobs dzs, NL EF.  . Diabetic peripheral neuropathy ([place])   . Diffuse non-Hodgkin's lymphoma of testis ([place]) 09/28/2015  . DM (diabetes mellitus) ([place])    Type 2, peripheral neuropathy.  . Dyspnea    with exertion  . Dysrhythmia   . GERD (gastroesophageal reflux disease)   . Headache   . History of bronchitis   . History of kidney stones   . History of radiation therapy 02/19/16 - 03/13/16   Testis/Scrotum: 32.4 Gy in 18 fractions  . History of radiation therapy 08/07/16-08/20/16   left adrenal gland mass treated to 30 Gy in 10 fractions  . Hyperlipidemia   . Hypertension   . Iron deficiency anemia due to chronic blood loss 08/08/2017  . Low testosterone   . Nephrolithiasis   . OSA (obstructive sleep apnea) 11/26/2017  . Osteoarthritis    shoulder  . Restless leg   . SVT (supraventricular tachycardia) ([place])   . Urinary frequency   . Wears partial dentures  upper and lower    Medications:  Scheduled:  . atorvastatin  40 mg Oral Daily  . Chlorhexidine Gluconate Cloth  6 each Topical Daily  . gabapentin  600 mg Oral TID  . insulin aspart  0-15 Units Subcutaneous TID WC  . melatonin  1.5 mg Oral QHS  . metoprolol succinate  25 mg Oral Daily  . pantoprazole  40 mg Oral q AM  . polyethylene glycol  17 g Oral Daily  . potassium chloride SA  20 mEq Oral q AM  . pramipexole  3 mg Oral QPM  . sodium chloride flush  3 mL Intravenous Q12H  . vitamin B-12  1,000 mcg Oral Daily  . warfarin  4 mg Oral ONCE-1600  . Warfarin - Pharmacist Dosing Inpatient   Does not apply q1600    Assessment: 81 yof with history of Afib on warfarin who presented s/p fall and has increased SOB. INR on admission was subtherapeutic at 1.7.   INR remains subtherapeutic at 1.7. Hgb 9, plt 137. No s/sx of bleeding.  PTA regimen is 2.5 mg daily except 5 mg STT.   Goal of Therapy:  INR 2-3 Monitor platelets by anticoagulation protocol: Yes   Plan:  Warfarin 4 mg tonight to trend into goal  range Monitor daily INR, CBC, and for s/sx of bleeding   Antonietta Jewel, PharmD, BCCCP Clinical Pharmacist  Phone: [phone] 08/09/2019 1:43 PM  Please check AMION for all [facility] phone numbers After 10:00 PM, call [facility] [phone]

## 2019-08-09 NOTE — Progress Notes (Signed)
VASCULAR LAB PRELIMINARY  PRELIMINARY  PRELIMINARY  PRELIMINARY  ABIs completed.    Preliminary report:  See CV proc for preliminary results.  Armend Hochstatter, RVT 08/09/2019, 12:04 PM

## 2019-08-09 NOTE — Consult Note (Signed)
[facility] Nurse Consult Note: Patient receiving care in [facility]. Reason for Consult: determine if patient okay for unna boots Wound type: NO wounds, only pitting BLE edema.  Palpable bilateral dorsalis pedis pulses. ABI results on chart from this admission. Pressure Injury POA: Yes/No/NA Measurement: Wound bed: Drainage (amount, consistency, odor)  Periwound: Dressing procedure/placement/frequency: I have ordered bilateral application of unna boots order. I have explained to the patient and a male visitor that they MUST be removed in one week. Thank you for the consult.  Discussed plan of care with the patient.  [facility] nurse will not follow at this time.  Please re-consult the [facility] team if needed.  Val Riles, RN, MSN, CWOCN, CNS-BC, pager [phone]

## 2019-08-09 NOTE — Progress Notes (Addendum)
Progress Note    Gary Yates.  WSF:[id number] DOB: June 08, 1933  DOA: 08/07/2019 PCP: [name], MD    Brief Narrative:     Medical records reviewed and are as summarized below:  Gary Garnet. is an 86 y.o. male with medical history significant of paroxysmal A. fib on Coumadin, COPD, CAD, non-Hodgkin's lymphoma in remission, hyperlipidemia, hypertension, coronary artery disease, obstructive sleep apnea, chronic renal insufficiency stage III diabetes mellitus type 2 who is status post fall approximately 2 weeks ago with left rib fractures and then has fallen twice more over the last few days.  He has had increasing weakness and fatigue as well as lower extremity edema.  He was seen by his PCP earlier this week and his Lasix was increased due to lower extremity swelling and some shortness of breath.  He reports his Lasix has not been working.  His last echo was in 2019 showed EF of 55 to 60%.  He was noted increasing spasms and shaking in his lower extremities and inability to really bear weight.  So he called EMS.  He was found to be in a weird tachyarrhythmia during that time.   Assessment/Plan:   Principal Problem:   Cardiac arrhythmia Active Problems:   Hyperlipidemia, mixed   Morbid obesity (HCC)   Essential hypertension   Coronary atherosclerosis   G E R D   Diabetic peripheral neuropathy (HCC)   Paroxysmal atrial fibrillation ([place])   Long term current use of anticoagulant therapy   Type 2 diabetes mellitus with diabetic neuropathy, unspecified (HCC)   Diffuse non-Hodgkin's lymphoma of testis (HCC)   Bilateral lower extremity edema   Anemia in chronic renal disease   OSA (obstructive sleep apnea)   SVT (supraventricular tachycardia) (HCC)   Hypomagnesemia   Aortic atherosclerosis (HCC)   Cardiac arrhythmia Currently in sinus Cardiology on board: the patient is minimally if at all symptomatic. His brief spells of lightheadedness may or may not be  related to SVT. I discussed the treatment options in detail. As long as he is not symptomatic and his SVT is quiet, I would suggest he continue his low dose beta blocker and undergo watchful waiting. If he has more SVT today and he can correlate symptoms with SVT the catheter ablation would be indicated.  History of paroxysmal atrial fibrillation on Coumadin -pharmacy to dose  Lower extremity edema -redness improved, no sign of infection - UNNA boots  -ABI noncompressible -resume lasix Outpatient referral to wound center  Acute Renal failure on chronic renal insufficiency stage lllb -resume lasix at a lower dose  Hyperlipidemia Lipitor 40 mg daily  Hypertension Toprol 25 mg daily  Coronary artery disease No chest pain at present Negative troponins Cardiac echo pending  Diabetes On Metformin at home, on hold due to kidney function and acute hospitalization Sliding scale insulin Diabetic diet  GERD Protonix 40 mg daily  Diabetic neuropathy Neurontin 600 mg twice daily  History of non-Hodgkin's lymphoma in remission Last follow-up with Dr. Marin Olp in May 2021  Anemia of chronic disease Iron and B12 on the lower side -replace  Hypomagnesemia -aggressive replacement  Obstructive sleep apnea -CPAP qhs?  Morbid obesity Could benefit from healthy lifestyle and weight loss. Estimated body mass index is 35.16 kg/m as calculated from the following:   Height as of this encounter: 5\' 9"  (1.753 m).   Weight as of this encounter: 108 kg.  Family Communication/Anticipated D/C date and plan/Code Status   DVT prophylaxis:  coumadin Code Status: Full Code.   Disposition Plan: Status is: Inpatient  Remains inpatient appropriate because:Ongoing diagnostic testing needed not appropriate for outpatient work up   Dispo: The patient is from: Home              Anticipated d/c is to: Home              Anticipated d/c date is: in AM              Patient currently  is not medically stable to d/c.         Medical Consultants:    cards     Subjective:   Headache this AM, thinks leg swelling is better  Objective:    Vitals:   08/08/19 1957 08/09/19 0556 08/09/19 0747 08/09/19 1102  BP: (!) 107/54  (!) 109/60 115/70  Pulse: 71  69 73  Resp: 20  17 20   Temp: 97.7 F (36.5 C)  98 F (36.7 C) 98.3 F (36.8 C)  TempSrc: Oral  Oral Oral  SpO2: 94%  95% 95%  Weight:  (!) 108 kg    Height:        Intake/Output Summary (Last 24 hours) at 08/09/2019 1407 Last data filed at 08/09/2019 3614 Gross per 24 hour  Intake 223 ml  Output 1600 ml  Net -1377 ml   Filed Weights   08/07/19 1059 08/07/19 2027 08/09/19 0556  Weight: (!) 105.2 kg (!) 104.5 kg (!) 108 kg    Exam:  General: Appearance:    Obese male in no acute distress     Lungs:     Clear to auscultation bilaterally, respirations unlabored     MS:   All extremities are intact. +LE edema but improved with wrinkles  Neurologic:   Awake, alert, oriented x 3. No apparent focal neurological           defect.      Data Reviewed:   I have personally reviewed following labs and imaging studies:  Labs: Labs show the following:   Basic Metabolic Panel: Recent Labs  Lab 08/07/19 1121 08/07/19 1121 08/07/19 1642 08/08/19 0534 08/09/19 0119  NA 135  --   --  136 137  K 3.6   < >  --  3.7 4.1  CL 99  --   --  102 105  CO2 24  --   --  25 24  GLUCOSE 164*  --   --  158* 146*  BUN 24*  --   --  21 16  CREATININE 1.88*  --   --  1.46* 1.27*  CALCIUM 8.4*  --   --  8.1* 8.6*  MG  --   --  1.2* 1.6* 2.2   < > = values in this interval not displayed.   GFR Estimated Creatinine Clearance: 52.4 mL/min (A) (by C-G formula based on SCr of 1.27 mg/dL (H)). Liver Function Tests: Recent Labs  Lab 08/07/19 1817 08/08/19 0534  AST 12* 13*  ALT 8 6  ALKPHOS 76 69  BILITOT 0.5 0.6  PROT 6.4* 5.5*  ALBUMIN 2.9* 2.5*   No results for input(s): LIPASE, AMYLASE in the last 168  hours. No results for input(s): AMMONIA in the last 168 hours. Coagulation profile Recent Labs  Lab 08/03/19 0000 08/07/19 1642 08/08/19 0534 08/09/19 0119  INR 2.1 1.7* 1.8* 1.7*    CBC: Recent Labs  Lab 08/07/19 1121 08/08/19 0534 08/09/19 0119  WBC 6.4 6.0 5.0  HGB  9.6* 8.5* 9.0*  HCT 30.9* 26.7* 28.2*  MCV 98.4 97.1 97.9  PLT 170 141* 137*   Cardiac Enzymes: Recent Labs  Lab 08/08/19 1454  CKTOTAL 68   BNP (last 3 results) No results for input(s): PROBNP in the last 8760 hours. CBG: Recent Labs  Lab 08/08/19 1128 08/08/19 1615 08/08/19 2114 08/09/19 0622 08/09/19 1105  GLUCAP 146* 129* 156* 125* 129*   D-Dimer: No results for input(s): DDIMER in the last 72 hours. Hgb A1c: Recent Labs    08/09/19 0119  HGBA1C 6.8*   Lipid Profile: No results for input(s): CHOL, HDL, LDLCALC, TRIG, CHOLHDL, LDLDIRECT in the last 72 hours. Thyroid function studies: No results for input(s): TSH, T4TOTAL, T3FREE, THYROIDAB in the last 72 hours.  Invalid input(s): FREET3 Anemia work up: Recent Labs    08/08/19 0533 08/08/19 0534  VITAMINB12  --  205  FOLATE 7.2  --   FERRITIN  --  470*  TIBC  --  186*  IRON  --  38*   Sepsis Labs: Recent Labs  Lab 20-Aug-2019 1121 08/08/19 0534 08/09/19 0119  WBC 6.4 6.0 5.0    Microbiology Recent Results (from the past 240 hour(s))  SARS Coronavirus 2 by RT PCR (hospital order, performed in [facility] hospital lab) Nasopharyngeal Nasopharyngeal Swab     Status: None   Collection Time: 2019-08-20  4:42 PM   Specimen: Nasopharyngeal Swab  Result Value Ref Range Status   SARS Coronavirus 2 NEGATIVE NEGATIVE Final    Comment: (NOTE) SARS-CoV-2 target nucleic acids are NOT DETECTED.  The SARS-CoV-2 RNA is generally detectable in upper and lower respiratory specimens during the acute phase of infection. The lowest concentration of SARS-CoV-2 viral copies this assay can detect is 250 copies / mL. A negative result does not  preclude SARS-CoV-2 infection and should not be used as the sole basis for treatment or other patient management decisions.  A negative result may occur with improper specimen collection / handling, submission of specimen other than nasopharyngeal swab, presence of viral mutation(s) within the areas targeted by this assay, and inadequate number of viral copies (<250 copies / mL). A negative result must be combined with clinical observations, patient history, and epidemiological information.  Fact Sheet for Patients:   StrictlyIdeas.no  Fact Sheet for Healthcare Providers: BankingDealers.co.za  This test is not yet approved or  cleared by the [place] FDA and has been authorized for detection and/or diagnosis of SARS-CoV-2 by FDA under an Emergency Use Authorization (EUA).  This EUA will remain in effect (meaning this test can be used) for the duration of the COVID-19 declaration under Section 564(b)(1) of the Act, 21 U.S.C. section 360bbb-3(b)(1), unless the authorization is terminated or revoked sooner.  Performed at [facility], [place] [address], [place], [place] [address]   MRSA PCR Screening     Status: None   Collection Time: 08/20/2019  8:27 PM   Specimen: Nasal Mucosa; Nasopharyngeal  Result Value Ref Range Status   MRSA by PCR NEGATIVE NEGATIVE Final    Comment:        The GeneXpert MRSA Assay (FDA approved for NASAL specimens only), is one component of a comprehensive MRSA colonization surveillance program. It is not intended to diagnose MRSA infection nor to guide or monitor treatment for MRSA infections. Performed at [facility], [place] [address], [place],  [address]     Procedures and diagnostic studies:  US RENAL  Result Date: Aug 20, 2019 CLINICAL DATA:  Acute on chronic renal failure EXAM: RENAL / URINARY TRACT ULTRASOUND COMPLETE COMPARISON:  Jun 06, 2017 FINDINGS: Right Kidney: Renal  measurements: 11.6 x 5.2 x 6.1 cm = volume: 192 mL . Echogenicity within normal limits. Mild diffuse cortical thinning is seen. There are 2 anechoic cystic lesions seen within the right kidney the largest within the upper pole measuring 4.1 x 3.5 x 3.5 cm with internal septations. Left Kidney: Renal measurements: 12.2 x 5.4 x 6.4 cm = volume: 219 mL. Echogenicity within normal limits. Diffuse cortical thinning is noted. There is an anechoic cyst seen within the left kidney measuring 4.4 x 3.9 x 4.7 cm. Bladder: Decompressed with a suprapubic catheter. Other: None. IMPRESSION: Bilateral simple renal cysts. No hydronephrosis or other acute abnormality Electronically Signed   By: Prudencio Pair M.D.   On: 08/07/2019 22:04   DG Chest Portable 1 View  Result Date: 08/07/2019 CLINICAL DATA:  Shortness of breath, weakness and tremors EXAM: PORTABLE CHEST 1 VIEW COMPARISON:  Radiograph 07/12/2019, CT 07/23/2019 FINDINGS: Nondisplaced lateral eighth rib fracture, possibly acute to subacute with history of recent fall. No other acute or suspicious osseous or soft tissue abnormality. Degenerative changes are present in the imaged spine and shoulders. Atelectatic changes are present in the left lung base with more chronic elevation of the left hemidiaphragm similar to priors. Chronically coarsened interstitial changes are similar to comparison exams. No consolidation, features of edema, pneumothorax, or effusion. Stable cardiomediastinal contours with a borderline enlarged cardiac silhouette and a calcified, tortuous aorta. Right IJ approach Port-A-Cath tip terminates in the lower SVC. Telemetry leads and pacer pads overlie the chest. IMPRESSION: 1. Nondisplaced lateral eighth rib fracture, possibly acute to subacute with history of recent fall. 2. Chronically coarsened interstitial changes and left basilar atelectasis. 3. No other acute cardiopulmonary abnormalities. 4. Stable cardiomegaly. 5.  Aortic Atherosclerosis  (ICD10-I70.0). Electronically Signed   By: Lovena Le M.D.   On: 08/07/2019 16:43   VAS [place] ABI WITH/WO TBI  Result Date: 08/09/2019 LOWER EXTREMITY DOPPLER STUDY Indications: Significant edema, provider would like to place Unna boots in              patient with diabetes. High Risk         Hypertension, hyperlipidemia, Diabetes, coronary artery Factors:          disease.  Comparison Study: Prior study from 09/28/15 is available for comparison Performing Technologist: Sharion Dove RVS  Examination Guidelines: A complete evaluation includes at minimum, Doppler waveform signals and systolic blood pressure reading at the level of bilateral brachial, anterior tibial, and posterior tibial arteries, when vessel segments are accessible. Bilateral testing is considered an integral part of a complete examination. Photoelectric Plethysmograph (PPG) waveforms and toe systolic pressure readings are included as required and additional duplex testing as needed. Limited examinations for reoccurring indications may be performed as noted.  ABI Findings: +---------+------------------+-----+---------+--------+ Right    Rt Pressure (mmHg)IndexWaveform Comment  +---------+------------------+-----+---------+--------+ Brachial 127                    triphasic         +---------+------------------+-----+---------+--------+ PTA      194               1.52 biphasic          +---------+------------------+-----+---------+--------+ DP       254               1.98 biphasic          +---------+------------------+-----+---------+--------+  Great Toe127               0.99                   +---------+------------------+-----+---------+--------+ +---------+------------------+-----+---------+-------+ Left     Lt Pressure (mmHg)IndexWaveform Comment +---------+------------------+-----+---------+-------+ Brachial 128                    triphasic        +---------+------------------+-----+---------+-------+  PTA      181               1.41 biphasic         +---------+------------------+-----+---------+-------+ DP       254               1.98 biphasic         +---------+------------------+-----+---------+-------+ Great Toe148               1.16                  +---------+------------------+-----+---------+-------+ +-------+-----------+-----------+------------+------------+ ABI/TBIToday's ABIToday's TBIPrevious ABIPrevious TBI +-------+-----------+-----------+------------+------------+ Right  1.9        0.99       1.2                      +-------+-----------+-----------+------------+------------+ Left   1.9        1.16       1.2                      +-------+-----------+-----------+------------+------------+ Arterial wall calcification precludes accurate ankle pressures and ABIs. Right ABIs appear increased. Left ABIs appear increased. Vessels appear calcified with normal waveforms.  Summary: Right: Resting right ankle-brachial index indicates noncompressible right lower extremity arteries. The right toe-brachial index is normal. TBIs are unreliable. ABIs are unreliable. Vessels appear calcified with normal waveforms. Left: Resting left ankle-brachial index indicates noncompressible left lower extremity arteries. The left toe-brachial index is normal. TBIs are unreliable. ABIs are unreliable.  *See table(s) above for measurements and observations.     Preliminary    ECHOCARDIOGRAM COMPLETE  Result Date: 08/08/2019    ECHOCARDIOGRAM REPORT   Patient Name:   Gary Haugan. Date of Exam: 08/08/2019 Medical Rec #:  [id number]         Height:       69.0 in Accession #:    [id number]        Weight:       230.4 lb Date of Birth:  03-13-1933         BSA:          2.194 m Patient Age:    61 years          BP:           99/49 mmHg Patient Gender: M                 HR:           69 bpm. Exam Location:  Inpatient Procedure: 2D Echo Indications:    palpitations 785.1  History:        Patient  has prior history of Echocardiogram examinations, most                 recent 06/10/2017. CAD, Arrythmias:paroxysmal afib,                 Signs/Symptoms:lower extremity edema; Risk Factors:Hypertension,  Dyslipidemia, Sleep Apnea and Diabetes.  Sonographer:    Johny Chess Referring Phys: [place]  1. Left ventricular ejection fraction, by estimation, is 60 to 65%. The left ventricle has normal function. The left ventricle has no regional wall motion abnormalities. Left ventricular diastolic parameters are consistent with Grade I diastolic dysfunction (impaired relaxation).  2. Right ventricular systolic function is normal. The right ventricular size is normal. There is normal pulmonary artery systolic pressure.  3. The mitral valve is normal in structure. No evidence of mitral valve regurgitation. No evidence of mitral stenosis.  4. The aortic valve is normal in structure. Aortic valve regurgitation is not visualized. Mild to moderate aortic valve sclerosis/calcification is present, without any evidence of aortic stenosis.  5. The inferior vena cava is normal in size with greater than 50% respiratory variability, suggesting right atrial pressure of 3 mmHg. FINDINGS  Left Ventricle: Left ventricular ejection fraction, by estimation, is 60 to 65%. The left ventricle has normal function. The left ventricle has no regional wall motion abnormalities. The left ventricular internal cavity size was normal in size. There is  no left ventricular hypertrophy. Left ventricular diastolic parameters are consistent with Grade I diastolic dysfunction (impaired relaxation). Right Ventricle: The right ventricular size is normal. No increase in right ventricular wall thickness. Right ventricular systolic function is normal. There is normal pulmonary artery systolic pressure. The tricuspid regurgitant velocity is 2.02 m/s, and  with an assumed right atrial pressure of 3 mmHg, the estimated  right ventricular systolic pressure is 27.0 mmHg. Left Atrium: Left atrial size was normal in size. Right Atrium: Right atrial size was normal in size. Pericardium: There is no evidence of pericardial effusion. Mitral Valve: The mitral valve is normal in structure. Normal mobility of the mitral valve leaflets. Mild mitral annular calcification. No evidence of mitral valve regurgitation. No evidence of mitral valve stenosis. Tricuspid Valve: The tricuspid valve is normal in structure. Tricuspid valve regurgitation is not demonstrated. No evidence of tricuspid stenosis. Aortic Valve: The aortic valve is normal in structure. Aortic valve regurgitation is not visualized. Mild to moderate aortic valve sclerosis/calcification is present, without any evidence of aortic stenosis. Aortic valve mean gradient measures 10.0 mmHg.  Aortic valve peak gradient measures 17.7 mmHg. Aortic valve area, by VTI measures 1.42 cm. Pulmonic Valve: The pulmonic valve was normal in structure. Pulmonic valve regurgitation is not visualized. No evidence of pulmonic stenosis. Aorta: The aortic root is normal in size and structure. Venous: The inferior vena cava is normal in size with greater than 50% respiratory variability, suggesting right atrial pressure of 3 mmHg. IAS/Shunts: No atrial level shunt detected by color flow Doppler.  LEFT VENTRICLE PLAX 2D LVIDd:         5.10 cm  Diastology LVIDs:         3.20 cm  LV e' lateral:   8.59 cm/s LV PW:         1.10 cm  LV E/e' lateral: 11.0 LV IVS:        1.00 cm  LV e' medial:    7.29 cm/s LVOT diam:     2.10 cm  LV E/e' medial:  12.9 LV SV:         62 LV SV Index:   28 LVOT Area:     3.46 cm  RIGHT VENTRICLE             IVC RV S prime:     12.90 cm/s  IVC diam:  2.30 cm TAPSE (M-mode): 2.0 cm LEFT ATRIUM             Index       RIGHT ATRIUM           Index LA diam:        2.90 cm 1.32 cm/m  RA Area:     14.40 cm LA Vol (A2C):   52.7 ml 24.02 ml/m RA Volume:   31.90 ml  14.54 ml/m LA Vol  (A4C):   42.9 ml 19.55 ml/m LA Biplane Vol: 49.7 ml 22.65 ml/m  AORTIC VALVE AV Area (Vmax):    1.46 cm AV Area (Vmean):   1.33 cm AV Area (VTI):     1.42 cm AV Vmax:           210.50 cm/s AV Vmean:          150.000 cm/s AV VTI:            0.436 m AV Peak Grad:      17.7 mmHg AV Mean Grad:      10.0 mmHg LVOT Vmax:         88.55 cm/s LVOT Vmean:        57.550 cm/s LVOT VTI:          0.179 m LVOT/AV VTI ratio: 0.41  AORTA Ao Asc diam: 3.10 cm MITRAL VALVE                TRICUSPID VALVE MV Area (PHT): 2.83 cm     TR Peak grad:   16.3 mmHg MV Decel Time: 268 msec     TR Vmax:        202.00 cm/s MV E velocity: 94.30 cm/s MV A velocity: 119.00 cm/s  SHUNTS MV E/A ratio:  0.79         Systemic VTI:  0.18 m                             Systemic Diam: 2.10 cm Candee Furbish MD Electronically signed by Candee Furbish MD Signature Date/Time: 08/08/2019/11:52:00 AM    Final     Medications:   . atorvastatin  40 mg Oral Daily  . Chlorhexidine Gluconate Cloth  6 each Topical Daily  . gabapentin  600 mg Oral TID  . insulin aspart  0-15 Units Subcutaneous TID WC  . melatonin  1.5 mg Oral QHS  . metoprolol succinate  25 mg Oral Daily  . pantoprazole  40 mg Oral q AM  . polyethylene glycol  17 g Oral Daily  . potassium chloride SA  20 mEq Oral q AM  . pramipexole  3 mg Oral QPM  . sodium chloride flush  3 mL Intravenous Q12H  . vitamin B-12  1,000 mcg Oral Daily  . warfarin  4 mg Oral ONCE-1600  . Warfarin - Pharmacist Dosing Inpatient   Does not apply q1600   Continuous Infusions: . sodium chloride    . sodium chloride       LOS: 2 days   Geradine Girt  [facility]   How to contact the [facility] Attending or Consulting provider [place] or covering provider during after hours [place], for this patient?  1. Check the care team in [facility] and look for a) attending/consulting [facility] provider listed and b) the [facility] team listed 2. Log into [email or web address] and use [place]'s universal password to access. If you do  not have the password,  please contact the hospital operator. 3. Locate the [facility] provider you are looking for under [facility] and page to a number that you can be directly reached. 4. If you still have difficulty reaching the provider, please page the [facility] (Director on Call) for the Hospitalists listed on amion for assistance.  08/09/2019, 2:07 PM

## 2019-08-09 NOTE — Progress Notes (Signed)
Orthopedic Tech Progress Note Patient Details:  Gary Yates December 25, 1933 [id number] Gary Yates to apply Gary Yates but RN wanted to wait until tomorrow due to patient "spazzing" of legs Patient ID: Gary Yates., male   DOB: 11-28-1933, 86 y.o.   MRN: [id number]   Gary Yates 08/09/2019, 4:16 PM

## 2019-08-10 ENCOUNTER — Other Ambulatory Visit: Payer: Self-pay

## 2019-08-10 LAB — GLUCOSE, CAPILLARY
Glucose-Capillary: 122 mg/dL — ABNORMAL HIGH (ref 70–99)
Glucose-Capillary: 122 mg/dL — ABNORMAL HIGH (ref 70–99)
Glucose-Capillary: 139 mg/dL — ABNORMAL HIGH (ref 70–99)
Glucose-Capillary: 115 mg/dL — ABNORMAL HIGH (ref 70–99)
Glucose-Capillary: 132 mg/dL — ABNORMAL HIGH (ref 70–99)

## 2019-08-10 LAB — BASIC METABOLIC PANEL
Anion gap: 7 (ref 5–15)
BUN: 12 mg/dL (ref 8–23)
CO2: 25 mmol/L (ref 22–32)
Calcium: 8.7 mg/dL — ABNORMAL LOW (ref 8.9–10.3)
Chloride: 103 mmol/L (ref 98–111)
Creatinine, Ser: 1.08 mg/dL (ref 0.61–1.24)
GFR calc Af Amer: >60 mL/min (ref >60)
GFR calc non Af Amer: >60 mL/min (ref >60)
Glucose, Bld: 120 mg/dL — ABNORMAL HIGH (ref 70–99)
Potassium: 4 mmol/L (ref 3.5–5.1)
Sodium: 135 mmol/L (ref 135–145)

## 2019-08-10 LAB — PROTIME-INR
INR: 1.8 — ABNORMAL HIGH (ref 0.8–1.2)
Prothrombin Time: 20.2 seconds — ABNORMAL HIGH (ref 11.4–15.2)

## 2019-08-10 MED ORDER — WARFARIN SODIUM 5 MG PO TABS
5.0000 mg | ORAL_TABLET | Freq: Once | ORAL | Status: AC
  Administered 2019-08-10: 5 mg via ORAL
  Filled 2019-08-10: qty 1

## 2019-08-10 NOTE — TOC Initial Note (Addendum)
Transition of Care (TOC) - Initial/Assessment Note    Patient Details  Name: Gary Yates. MRN: [id number] Date of Birth: 1933-07-14  Transition of Care [facility]) CM/SW Contact:    Zenon Mayo, RN Phone Number: 08/10/2019, 2:46 PM  Clinical Narrative:                 NCM spoke with patient and daughter Gary Yates at beside, NCM asked patient if he wanted to go to SNF ,he states yes he would like [facility].  Daughter , Gary Yates states she agrees with SNF. He has walker, cane at home.  He states he will plan to do short term rehab for 2 weeks and then go home.  Patient and daughter gave NCM permission to fax out information to other SNF's.  The contact person for his care is [facility]  [phone] or [phone].  NCM notified Gary Yates that [facility] is not taking patient due to outbreak, she states that ok to continue to see what other bed offers we will get.  NCM contacted [facility], they do manage this insurance for snf, the reference number is [id number]. Clinicals faxed to [facility]. NCM informed Gary Yates of the three bed offers so far, [facility], [facility] and [facility] , she would like to see what other bed offers he will receive in the am.  NCM contacted [facility] to let them know that SNF will now be [facility], they states we have to restart auth and the new ref number is [id number].    Expected Discharge Plan: Skilled Nursing Facility Barriers to Discharge: Continued Medical Work up   Patient Goals and CMS Choice Patient states their goals for this hospitalization and ongoing recovery are:: get better CMS Medicare.gov Compare Post Acute Care list provided to:: Patient Represenative (must comment) Choice offered to / list presented to : Adult Children Gary Yates)  Expected Discharge Plan and Services Expected Discharge Plan: [place]   Discharge Planning Services: CM Consult Post Acute Care Choice: [place] Living arrangements for the past 2 months:  Single Family Home                   DME Agency: NA       HH Arranged: NA          Prior Living Arrangements/Services Living arrangements for the past 2 months: Single Family Home Lives with:: Spouse Patient language and need for interpreter reviewed:: Yes Do you feel safe going back to the place where you live?: Yes      Need for Family Participation in Patient Care: Yes (Comment) Care giver support system in place?: Yes (comment) Current home services: DME (has grab bars, walker, cane,) Criminal Activity/Legal Involvement Pertinent to Current Situation/Hospitalization: No - Comment as needed  Activities of Daily Living Home Assistive Devices/Equipment: Cane (specify quad or straight), Walker (specify type) ADL Screening (condition at time of admission) Patient's cognitive ability adequate to safely complete daily activities?: Yes Is the patient deaf or have difficulty hearing?: No Does the patient have difficulty seeing, even when wearing glasses/contacts?: No Does the patient have difficulty concentrating, remembering, or making decisions?: No Patient able to express need for assistance with ADLs?: Yes Does the patient have difficulty dressing or bathing?: No Independently performs ADLs?: Yes (appropriate for developmental age) Does the patient have difficulty walking or climbing stairs?: Yes Weakness of Legs: Both Weakness of Arms/Hands: None  Permission Sought/Granted  Emotional Assessment Appearance:: Appears stated age Attitude/Demeanor/Rapport: Engaged Affect (typically observed): Appropriate Orientation: : Oriented to Self, Oriented to Place, Oriented to  Time, Oriented to Situation Alcohol / Substance Use: Not Applicable Psych Involvement: No (comment)  Admission diagnosis:  Hypomagnesemia [E83.42] Cardiac arrhythmia [I49.9] SVT (supraventricular tachycardia) (HCC) [I47.1] Leg swelling [M79.89] Patient Active Problem List   Diagnosis  Date Noted  . Cardiac arrhythmia 08/07/2019  . SVT (supraventricular tachycardia) ([place]) 08/07/2019  . Hypomagnesemia 08/07/2019  . Aortic atherosclerosis ([place]) 08/07/2019  . OSA (obstructive sleep apnea) 11/26/2017  . Iron deficiency anemia due to chronic blood loss 08/08/2017  . Sepsis secondary to UTI ([place]) 06/17/2017  . Thrombocytopenia ([place]) 06/17/2017  . Acute urinary retention 06/17/2017  . Anemia in chronic renal disease 05/07/2017  . V-tach ([place]) 07/02/2016  . Protein-calorie malnutrition, severe 07/02/2016  . Bilateral lower extremity edema 07/01/2016  . Diffuse non-Hodgkin's lymphoma of testis ([place]) 09/28/2015  . Lumbosacral spondylosis with radiculopathy 02/07/2015  . At high risk for falls 05/05/2014  . Encounter for therapeutic drug monitoring 03/15/2013  . Chest pain 02/03/2013  . Obesity (BMI 30-39.9) 09/21/2012  . Type 2 diabetes mellitus with diabetic neuropathy, unspecified ([place]) 07/03/2012  . Chronic kidney disease, stage III (moderate) 07/03/2012  . Restless leg syndrome 12/04/2011  . Long term current use of anticoagulant therapy 07/15/2011  . Paroxysmal atrial fibrillation ([place]) 06/21/2011  . Upper extremity weakness 06/21/2011  . Diabetic peripheral neuropathy ([place]) 12/17/2010  . Hypotestosteronism 05/07/2010  . LEG CRAMPS, IDIOPATHIC 08/14/2009  . Insomnia 08/14/2009  . Malignant neoplasm of skin of face 08/14/2009  . OBESITY 08/02/2009  . HYPERTENSION, HEART CONTROLLED W/O ASSOC CHF 03/28/2008  . G E R D 01/12/2007  . Morbid obesity ([place]) 09/15/2006  . Hyperlipidemia, mixed 07/01/2006  . Essential hypertension 07/01/2006  . Coronary atherosclerosis 07/01/2006  . OSTEOARTHRITIS 07/01/2006   PCP:  [name], MD Pharmacy:   [id number] - [place], [address] [place] [address] Phone: [phone] Fax: [phone]  [id number] - [name], [place] [address] [place] [address] Phone: [phone] Fax: [phone]     Social Determinants of Health (SDOH) Interventions    Readmission Risk Interventions Readmission Risk Prevention Plan 08/10/2019  Transportation Screening Complete  Medication Review ([place]) Complete  PCP or Specialist appointment within 3-5 days of discharge Complete  HRI or Home Care Consult Complete  SW Recovery Care/Counseling Consult Complete  Palliative Care Screening Not [place] Complete  Some recent data might be hidden

## 2019-08-10 NOTE — Evaluation (Signed)
Occupational Therapy Evaluation Patient Details Name: Gary Yates. MRN: [id number] DOB: 1933-07-06 Today's Date: 08/10/2019    History of Present Illness Pt adm with cardiac arrthymia/SVT. Pt with PMH - afib, copd, cad, non-Hodgkin's lymphoma in reission, htn, cad osa, DM, recent lt rib fx's, obesity   Clinical Impression   PT admitted with cardiac arrthymia. Pt currently with functional limitiations due to the deficits listed below (see OT problem list). Pt currently with balance deficits that put him at high fall risk with hx of falls. Pt lacks insight to fall risk during session. Pt requires min (A) for basic transfers. Unable to reach family x2 daughters via phone to verify (A) level upon d/c. Pt reports that he has to care for his wife and that she has to help him dress. Pt also states taht he was going to have therapy at home but he just didn't feel up to it so he cancelled it. Concerns are once home he will limit care providers from (A) to help with fall risk demonstrated.  Pt will benefit from skilled OT to increase their independence and safety with adls and balance to allow discharge SNF. Pt will need 24/7 (A) as he has a posterior bias with transfers and inability to correct.      Follow Up Recommendations  SNF;Supervision/Assistance - 24 hour (declining wants to go home)    Equipment Recommendations  None recommended by OT    Recommendations for Other Services       Precautions / Restrictions Precautions Precautions: Fall      Mobility Bed Mobility Overal bed mobility: Needs Assistance Bed Mobility: Supine to Sit     Supine to sit: Min assist     General bed mobility comments: pulling on rail to exit L side of bed and needs increased time and effort  Transfers Overall transfer level: Needs assistance Equipment used: 4-wheeled walker Transfers: Sit to/from Stand Sit to Stand: Min assist;From elevated surface         General transfer comment: pt requires  rocking momentum to achieve static standing. pt requires OT to complete transfer and would not have achieve static upright posture to do posterior lean bias with transfer    Balance Overall balance assessment: Needs assistance Sitting-balance support: No upper extremity supported;Feet supported Sitting balance-Leahy Scale: Good     Standing balance support: Bilateral upper extremity supported;During functional activity Standing balance-Leahy Scale: Poor Standing balance comment: reliant on RW and anterior head tilt to sustain a static posture                           ADL either performed or assessed with clinical judgement   ADL Overall ADL's : Needs assistance/impaired Eating/Feeding: Supervision/ safety;Sitting                   Lower Body Dressing: Maximal assistance Lower Body Dressing Details (indicate cue type and reason): don socks. pt was able to partially get a figure 4 in EOB sitting. pt had to use bil UE to hold Leg into position and asking therapist to don sock. pt once foot on ground able to pull on sock at calf height. pt reports "my wife helps me" but he also reports that he is the caregiver for wife due to her health conditions Toilet Transfer: Minimal assistance;RW Toilet Transfer Details (indicate cue type and reason): simulated EOB to chair. pt requires rocking momentum to static stand  General ADL Comments: pt progressed to chair to eat lunch     Vision Baseline Vision/History: Wears glasses Wears Glasses: At all times       Perception     Praxis      Pertinent Vitals/Pain Pain Assessment: No/denies pain     Hand Dominance Right   Extremity/Trunk Assessment Upper Extremity Assessment Upper Extremity Assessment: Generalized weakness   Lower Extremity Assessment Lower Extremity Assessment: Defer to PT evaluation   Cervical / Trunk Assessment Cervical / Trunk Assessment: Kyphotic   Communication  Communication Communication: No difficulties   Cognition Arousal/Alertness: Awake/alert Behavior During Therapy: WFL for tasks assessed/performed Overall Cognitive Status: Impaired/Different from baseline Area of Impairment: Memory;Awareness                     Memory: Decreased short-term memory     Awareness: Emergent   General Comments: pt high fall risk but reports that he can go home he has to care for wife. pt requires (A) for his transfers due to his balance deficits but lacks awareness to his inability to meet his own needs.   General Comments  VSS, unna boots bil LE wrapped at this time. applied bariatric blue socks this session for safety with transfer. pt requires small cut in the elastic for proper fit    Exercises     Shoulder Instructions      Home Living Family/patient expects to be discharged to:: Private residence Living Arrangements: Spouse/significant other Available Help at Discharge: Family;Available 24 hours/day Type of Home: House Home Access: Stairs to enter [employer] of Steps: 2 Entrance Stairs-Rails: Right Home Layout: Able to live on main level with bedroom/bathroom;Laundry or work area in basement;Two level   Alternate Level Stairs-Rails: Right Bathroom Shower/Tub: Tub/shower unit   Bathroom Toilet: Handicapped height     Home Equipment: Walker - 4 wheels          Prior Functioning/Environment Level of Independence: Independent with assistive device(s)        Comments: worried about wife because wife has dementia and visual deficts. reports son and daughter are helping "pitch in and care for her"  has palliative care help with wife        OT Problem List: Decreased strength;Decreased activity tolerance;Impaired balance (sitting and/or standing);Decreased cognition;Decreased safety awareness;Decreased knowledge of use of DME or AE;Decreased knowledge of precautions;Obesity      OT Treatment/Interventions:  Self-care/ADL training;Energy conservation;DME and/or AE instruction;Manual therapy;Therapeutic activities;Cognitive remediation/compensation;Patient/family education;Balance training    OT Goals(Current goals can be found in the care plan section) Acute Rehab OT Goals Patient Stated Goal: return home OT Goal Formulation: With patient Time For Goal Achievement: 08/24/19 Potential to Achieve Goals: Good  OT Frequency: Min 2X/week   Barriers to D/C: Other (comment) (uncertain of caregiver (A))  attempted to call daughtersx2 in chart with no answer. pt reports that kids are caring for wife but later in session reports that wife must help him dress due to his deficits       Co-evaluation              AM-PAC OT "6 Clicks" Daily Activity     Outcome Measure Help from another person eating meals?: None Help from another person taking care of personal grooming?: A Little Help from another person toileting, which includes using toliet, bedpan, or urinal?: A Lot Help from another person bathing (including washing, rinsing, drying)?: A Lot Help from another person to put on and taking off regular upper  body clothing?: A Little Help from another person to put on and taking off regular lower body clothing?: A Lot 6 Click Score: 16   End of Session Equipment Utilized During Treatment: Gait belt;Oxygen Nurse Communication: Mobility status;Precautions  Activity Tolerance: Patient tolerated treatment well Patient left: in chair;with call bell/phone within reach  OT Visit Diagnosis: Unsteadiness on feet (R26.81);History of falling (Z91.81)                Time: 4136-4383 OT Time Calculation (min): 17 min Charges:  OT General Charges $OT Visit: 1 Visit OT Evaluation $OT Eval Moderate Complexity: 1 Mod   [place], OTR/L  Acute Rehabilitation Services Pager: [phone] Office: [phone] .   Jeri Modena 08/10/2019, 12:26 PM

## 2019-08-10 NOTE — Progress Notes (Signed)
Progress Note    Gary Yates.  HER:[id number] DOB: 09/06/1933  DOA: 08/07/2019 PCP: Gary Post, MD    Brief Narrative:     Medical records reviewed and are as summarized below:  Gary Yates. is an 86 y.o. male with medical history significant of paroxysmal A. fib on Coumadin, COPD, CAD, non-Hodgkin's lymphoma in remission, hyperlipidemia, hypertension, coronary artery disease, obstructive sleep apnea, chronic renal insufficiency stage III diabetes mellitus type 2 who is status Yates fall approximately 2 weeks ago with left rib fractures and then has fallen twice more over the last few days.  He has had increasing weakness and fatigue as well as lower extremity edema.  He was seen by his PCP earlier this week and his Lasix was increased due to lower extremity swelling and some shortness of breath.  He reports his Lasix has not been working.  His last echo was in 2019 showed EF of 55 to 60%.  He was noted increasing spasms and shaking in his lower extremities and inability to really bear weight.  So he called EMS.  He was found to be in a weird tachyarrhythmia during that time.   Assessment/Plan:   Principal Problem:   Cardiac arrhythmia Active Problems:   Hyperlipidemia, mixed   Morbid obesity (HCC)   Essential hypertension   Coronary atherosclerosis   G E R D   Diabetic peripheral neuropathy (HCC)   Paroxysmal atrial fibrillation ([place])   Long term current use of anticoagulant therapy   Type 2 diabetes mellitus with diabetic neuropathy, unspecified (HCC)   Diffuse non-Hodgkin's lymphoma of testis (HCC)   Bilateral lower extremity edema   Anemia in chronic renal disease   OSA (obstructive sleep apnea)   SVT (supraventricular tachycardia) (HCC)   Hypomagnesemia   Aortic atherosclerosis (HCC)   Cardiac arrhythmia Currently in sinus Cardiology on board: the patient is minimally if at all symptomatic. His brief spells of lightheadedness may or may not be  related to SVT. I discussed the treatment options in detail. As long as he is not symptomatic and his SVT is quiet, I would suggest he continue his low dose beta blocker and undergo watchful waiting. If he has more SVT today and he can correlate symptoms with SVT the catheter ablation would be indicated.  History of paroxysmal atrial fibrillation on Coumadin -pharmacy to dose  Lower extremity edema -redness improved, no sign of infection - UNNA boots placed -ABI noncompressible -resume lasix at original dose Outpatient referral to wound center for follow up  Acute Renal failure on chronic renal insufficiency stage lllb -resume lasix at a lower dose and monitor  Hyperlipidemia Lipitor 40 mg daily  Hypertension Toprol 25 mg daily  Coronary artery disease No chest pain at present Negative troponins  Diabetes- type 2 with hyperglycemia On Metformin at home, on hold due to kidney function and acute hospitalization Sliding scale insulin Diabetic diet  GERD Protonix 40 mg daily  Diabetic neuropathy Neurontin 600 mg TID  History of non-Hodgkin's lymphoma in remission Last follow-up with Dr. Marin Yates in May 2021  Anemia of chronic disease Iron and B12 on the lower side -replaced  Hypomagnesemia -aggressive replacement  Obstructive sleep apnea -CPAP qhs?  Morbid obesity Could benefit from healthy lifestyle and weight loss. Estimated body mass index is 35.16 kg/m as calculated from the following:   Height as of this encounter: 5\' 9"  (1.753 m).   Weight as of this encounter: 108 kg.  Family Communication/Anticipated D/C  date and plan/Code Status   DVT prophylaxis: coumadin Code Status: Full Code.   Disposition Plan: Status is: Inpatient  Remains inpatient appropriate because:Ongoing diagnostic testing needed not appropriate for outpatient work up   Dispo: The patient is from: Home              Anticipated d/c is to: Home              Anticipated d/c  date is: in AM              Patient currently is not medically stable to d/c.         Medical Consultants:    cards     Subjective:   Unsteady with OT-- family would like SNF if qualifies  Objective:    Vitals:   08/10/19 0341 08/10/19 0400 08/10/19 0743 08/10/19 1040  BP: 126/69     Pulse:  67    Resp: 16 15    Temp: 97.7 F (36.5 C)  97.7 F (36.5 C) 97.8 F (36.6 C)  TempSrc: Oral  Oral Oral  SpO2: 95%   98%  Weight:      Height:        Intake/Output Summary (Last 24 hours) at 08/10/2019 1352 Last data filed at 08/10/2019 0341 Gross per 24 hour  Intake 480 ml  Output 1700 ml  Net -1220 ml   Filed Weights   08/07/19 1059 08/07/19 2027 08/09/19 0556  Weight: (!) 105.2 kg (!) 104.5 kg (!) 108 kg    Exam:   General: Appearance:    Obese male in no acute distress     Lungs:     respirations unlabored  Heart:  rrr  MS:   All extremities are intact. LE edema, improved from admission, redness almost resolved  Neurologic:   Awake, alert, pleasant and cooperative    Data Reviewed:   I have personally reviewed following labs and imaging studies:  Labs: Labs show the following:   Basic Metabolic Panel: Recent Labs  Lab 08/07/19 1121 08/07/19 1121 08/07/19 1642 08/08/19 0534 08/08/19 0534 08/09/19 0119 08/10/19 0145  NA 135  --   --  136  --  137 135  K 3.6   < >  --  3.7   < > 4.1 4.0  CL 99  --   --  102  --  105 103  CO2 24  --   --  25  --  24 25  GLUCOSE 164*  --   --  158*  --  146* 120*  BUN 24*  --   --  21  --  16 12  CREATININE 1.88*  --   --  1.46*  --  1.27* 1.08  CALCIUM 8.4*  --   --  8.1*  --  8.6* 8.7*  MG  --   --  1.2* 1.6*  --  2.2  --    < > = values in this interval not displayed.   GFR Estimated Creatinine Clearance: 61.6 mL/min (by C-G formula based on SCr of 1.08 mg/dL). Liver Function Tests: Recent Labs  Lab 08/07/19 1817 08/08/19 0534  AST 12* 13*  ALT 8 6  ALKPHOS 76 69  BILITOT 0.5 0.6  PROT 6.4* 5.5*    ALBUMIN 2.9* 2.5*   No results for input(s): LIPASE, AMYLASE in the last 168 hours. No results for input(s): AMMONIA in the last 168 hours. Coagulation profile Recent Labs  Lab 08/07/19 1642 08/08/19 0534 08/09/19  0119 08/10/19 0145  INR 1.7* 1.8* 1.7* 1.8*    CBC: Recent Labs  Lab 08/07/19 1121 08/08/19 0534 08/09/19 0119  WBC 6.4 6.0 5.0  HGB 9.6* 8.5* 9.0*  HCT 30.9* 26.7* 28.2*  MCV 98.4 97.1 97.9  PLT 170 141* 137*   Cardiac Enzymes: Recent Labs  Lab 08/08/19 1454  CKTOTAL 68   BNP (last 3 results) No results for input(s): PROBNP in the last 8760 hours. CBG: Recent Labs  Lab 08/09/19 1105 08/09/19 1630 08/09/19 2146 08/10/19 0624 08/10/19 1102  GLUCAP 129* 126* 145* 122*  122* 139*   D-Dimer: No results for input(s): DDIMER in the last 72 hours. Hgb A1c: Recent Labs    08/09/19 0119  HGBA1C 6.8*   Lipid Profile: No results for input(s): CHOL, HDL, LDLCALC, TRIG, CHOLHDL, LDLDIRECT in the last 72 hours. Thyroid function studies: No results for input(s): TSH, T4TOTAL, T3FREE, THYROIDAB in the last 72 hours.  Invalid input(s): FREET3 Anemia work up: Recent Labs    08/08/19 0533 08/08/19 0534  VITAMINB12  --  205  FOLATE 7.2  --   FERRITIN  --  470*  TIBC  --  186*  IRON  --  38*   Sepsis Labs: Recent Labs  Lab 08/07/19 1121 08/08/19 0534 08/09/19 0119  WBC 6.4 6.0 5.0    Microbiology Recent Results (from the past 240 hour(s))  SARS Coronavirus 2 by RT PCR (hospital order, performed in [facility] hospital lab) Nasopharyngeal Nasopharyngeal Swab     Status: None   Collection Time: 08/07/19  4:42 PM   Specimen: Nasopharyngeal Swab  Result Value Ref Range Status   SARS Coronavirus 2 NEGATIVE NEGATIVE Final    Comment: (NOTE) SARS-CoV-2 target nucleic acids are NOT DETECTED.  The SARS-CoV-2 RNA is generally detectable in upper and lower respiratory specimens during the acute phase of infection. The lowest concentration of  SARS-CoV-2 viral copies this assay can detect is 250 copies / mL. A negative result does not preclude SARS-CoV-2 infection and should not be used as the sole basis for treatment or other patient management decisions.  A negative result may occur with improper specimen collection / handling, submission of specimen other than nasopharyngeal swab, presence of viral mutation(s) within the areas targeted by this assay, and inadequate number of viral copies (<250 copies / mL). A negative result must be combined with clinical observations, patient history, and epidemiological information.  Fact Sheet for Patients:   StrictlyIdeas.no  Fact Sheet for Healthcare Providers: BankingDealers.co.za  This test is not yet approved or  cleared by the [place] FDA and has been authorized for detection and/or diagnosis of SARS-CoV-2 by FDA under an Emergency Use Authorization (EUA).  This EUA will remain in effect (meaning this test can be used) for the duration of the COVID-19 declaration under Section 564(b)(1) of the Act, 21 U.S.C. section 360bbb-3(b)(1), unless the authorization is terminated or revoked sooner.  Performed at [facility], [place] [address], [place], [place] [address]   MRSA PCR Screening     Status: None   Collection Time: 08/07/19  8:27 PM   Specimen: Nasal Mucosa; Nasopharyngeal  Result Value Ref Range Status   MRSA by PCR NEGATIVE NEGATIVE Final    Comment:        The GeneXpert MRSA Assay (FDA approved for NASAL specimens only), is one component of a comprehensive MRSA colonization surveillance program. It is not intended to diagnose MRSA infection nor to guide or monitor treatment for MRSA  infections. Performed at [facility], [place] [address] [place], [place] [address]     Procedures and diagnostic studies:  VAS [place] ABI WITH/WO TBI  Result Date: 08/09/2019 LOWER EXTREMITY DOPPLER STUDY Indications:  Significant edema, provider would like to place Unna boots in              patient with diabetes. High Risk         Hypertension, hyperlipidemia, Diabetes, coronary artery Factors:          disease.  Comparison Study: Prior study from 09/28/15 is available for comparison Performing Technologist: Sharion Dove RVS  Examination Guidelines: A complete evaluation includes at minimum, Doppler waveform signals and systolic blood pressure reading at the level of bilateral brachial, anterior tibial, and posterior tibial arteries, when vessel segments are accessible. Bilateral testing is considered an integral part of a complete examination. Photoelectric Plethysmograph (PPG) waveforms and toe systolic pressure readings are included as required and additional duplex testing as needed. Limited examinations for reoccurring indications may be performed as noted.  ABI Findings: +---------+------------------+-----+---------+--------+ Right    Rt Pressure (mmHg)IndexWaveform Comment  +---------+------------------+-----+---------+--------+ Brachial 127                    triphasic         +---------+------------------+-----+---------+--------+ PTA      194               1.52 biphasic          +---------+------------------+-----+---------+--------+ DP       254               1.98 biphasic          +---------+------------------+-----+---------+--------+ Great Toe127               0.99                   +---------+------------------+-----+---------+--------+ +---------+------------------+-----+---------+-------+ Left     Lt Pressure (mmHg)IndexWaveform Comment +---------+------------------+-----+---------+-------+ Brachial 128                    triphasic        +---------+------------------+-----+---------+-------+ PTA      181               1.41 biphasic         +---------+------------------+-----+---------+-------+ DP       254               1.98 biphasic          +---------+------------------+-----+---------+-------+ Great Toe148               1.16                  +---------+------------------+-----+---------+-------+ +-------+-----------+-----------+------------+------------+ ABI/TBIToday's ABIToday's TBIPrevious ABIPrevious TBI +-------+-----------+-----------+------------+------------+ Right  1.9        0.99       1.2                      +-------+-----------+-----------+------------+------------+ Left   1.9        1.16       1.2                      +-------+-----------+-----------+------------+------------+ Arterial wall calcification precludes accurate ankle pressures and ABIs. Right ABIs appear increased. Left ABIs appear increased. Vessels appear calcified with normal waveforms.  Summary: Right: Resting right ankle-brachial index indicates noncompressible right lower extremity arteries. The right toe-brachial index is normal.  TBIs are unreliable. ABIs are unreliable. Vessels appear calcified with normal waveforms. Left: Resting left ankle-brachial index indicates noncompressible left lower extremity arteries. The left toe-brachial index is normal. TBIs are unreliable. ABIs are unreliable.  *See table(s) above for measurements and observations.  Electronically signed by Ruta Hinds MD on 08/09/2019 at 3:48:05 PM.    Final     Medications:   . acetaminophen  1,000 mg Oral TID  . atorvastatin  40 mg Oral Daily  . Chlorhexidine Gluconate Cloth  6 each Topical Daily  . furosemide  40 mg Oral Daily  . gabapentin  600 mg Oral TID  . insulin aspart  0-15 Units Subcutaneous TID WC  . melatonin  1.5 mg Oral QHS  . metoprolol succinate  25 mg Oral Daily  . pantoprazole  40 mg Oral q AM  . polyethylene glycol  17 g Oral Daily  . potassium chloride SA  20 mEq Oral q AM  . pramipexole  3 mg Oral QPM  . sodium chloride flush  3 mL Intravenous Q12H  . vitamin B-12  1,000 mcg Oral Daily  . warfarin  5 mg Oral ONCE-1600  . Warfarin -  Pharmacist Dosing Inpatient   Does not apply q1600   Continuous Infusions: . sodium chloride    . sodium chloride       LOS: 3 days   Geradine Girt  [facility]   How to contact the [facility] Attending or Consulting provider [place] or covering provider during after hours [place], for this patient?  1. Check the care team in [facility] and look for a) attending/consulting [facility] provider listed and b) the [facility] team listed 2. Log into [email or web address] and use [place]'s universal password to access. If you do not have the password, please contact the hospital operator. 3. Locate the [facility] provider you are looking for under [facility] and page to a number that you can be directly reached. 4. If you still have difficulty reaching the provider, please page the [facility] (Director on Call) for the Hospitalists listed on amion for assistance.  08/10/2019, 1:52 PM

## 2019-08-10 NOTE — Progress Notes (Signed)
[place] for warfarin Indication: atrial fibrillation  Allergies  Allergen Reactions  . Latex Other (See Comments)    Reddens the skin  . Ace Inhibitors Cough  . Codeine Nausea Only and Rash       . Penicillins Rash    Childhood allergy Has patient had a PCN reaction causing immediate rash, facial/tongue/throat swelling, SOB or lightheadedness with hypotension: Yes Has patient had a PCN reaction causing severe rash involving mucus membranes or skin necrosis: Yes Has patient had a PCN reaction that required hospitalization No Has patient had a PCN reaction occurring within the last 10 years: No If all of the above answers are "NO", then may proceed with Cephalosporin use.     Patient Measurements: Height: 5\' 9"  (175.3 cm) Weight: (!) 108 kg (238 lb 1.6 oz) IBW/kg (Calculated) : 70.7  Vital Signs: Temp: 97.8 F (36.6 C) (08/03 1040) Temp Source: Oral (08/03 1040) BP: 126/69 (08/03 0341) Pulse Rate: 67 (08/03 0400)  Labs: Recent Labs    08/07/19 1642 08/07/19 1642 08/07/19 1817 08/08/19 0534 08/08/19 1454 08/09/19 0119 08/10/19 0145  HGB  --   --   --  8.5*  --  9.0*  --   HCT  --   --   --  26.7*  --  28.2*  --   PLT  --   --   --  141*  --  137*  --   LABPROT 19.5*   < >  --  20.0*  --  19.6* 20.2*  INR 1.7*   < >  --  1.8*  --  1.7* 1.8*  CREATININE  --   --   --  1.46*  --  1.27* 1.08  CKTOTAL  --   --   --   --  68  --   --   TROPONINIHS 7  --  9  --   --   --   --    < > = values in this interval not displayed.    Estimated Creatinine Clearance: 61.6 mL/min (by C-G formula based on SCr of 1.08 mg/dL).   Medical History: Past Medical History:  Diagnosis Date  . Anemia in chronic renal disease 05/07/2017  . Anxiety   . Atrial fibrillation ([place])   . COPD (chronic obstructive pulmonary disease) ([place])    pt. denies  . Coronary artery disease    a. h/o Overlapping stents RCA;  b. 06/2011 Cath: patent stents, nonobs  dzs, NL EF.  . Diabetic peripheral neuropathy ([place])   . Diffuse non-Hodgkin's lymphoma of testis ([place]) 09/28/2015  . DM (diabetes mellitus) ([place])    Type 2, peripheral neuropathy.  . Dyspnea    with exertion  . Dysrhythmia   . GERD (gastroesophageal reflux disease)   . Headache   . History of bronchitis   . History of kidney stones   . History of radiation therapy 02/19/16 - 03/13/16   Testis/Scrotum: 32.4 Gy in 18 fractions  . History of radiation therapy 08/07/16-08/20/16   left adrenal gland mass treated to 30 Gy in 10 fractions  . Hyperlipidemia   . Hypertension   . Iron deficiency anemia due to chronic blood loss 08/08/2017  . Low testosterone   . Nephrolithiasis   . OSA (obstructive sleep apnea) 11/26/2017  . Osteoarthritis    shoulder  . Restless leg   . SVT (supraventricular tachycardia) ([place])   . Urinary frequency   . Wears partial dentures  upper and lower    Medications:  Scheduled:  . acetaminophen  1,000 mg Oral TID  . atorvastatin  40 mg Oral Daily  . Chlorhexidine Gluconate Cloth  6 each Topical Daily  . furosemide  40 mg Oral Daily  . gabapentin  600 mg Oral TID  . insulin aspart  0-15 Units Subcutaneous TID WC  . melatonin  1.5 mg Oral QHS  . metoprolol succinate  25 mg Oral Daily  . pantoprazole  40 mg Oral q AM  . polyethylene glycol  17 g Oral Daily  . potassium chloride SA  20 mEq Oral q AM  . pramipexole  3 mg Oral QPM  . sodium chloride flush  3 mL Intravenous Q12H  . vitamin B-12  1,000 mcg Oral Daily  . Warfarin - Pharmacist Dosing Inpatient   Does not apply q1600    Assessment: 69 yof with history of Afib on warfarin who presented s/p fall and has increased SOB. INR on admission was subtherapeutic at 1.7.   INR remains subtherapeutic but trending up to 1.8. Hgb 9, plt 137 - stable on last check 8/2. No s/sx of bleeding. Oral intake documented between 50-75%.  PTA regimen is 2.5 mg daily except 5 mg STT.   Goal of Therapy:  INR 2-3 Monitor  platelets by anticoagulation protocol: Yes   Plan:  Warfarin 5 mg tonight  Monitor daily INR, CBC, and for s/sx of bleeding   Antonietta Jewel, PharmD, [place] Pharmacist  Phone: [phone] 08/10/2019 11:55 AM  Please check AMION for all [place] phone numbers After 10:00 PM, call [place] [phone]

## 2019-08-10 NOTE — NC FL2 (Signed)
[place] LEVEL OF CARE SCREENING TOOL     IDENTIFICATION  Patient Name: Gary Yates. Birthdate: 1933/05/20 Sex: male Admission Date (Current Location): 08/07/2019  [facility] and [place] Number:  Herbalist and Address:  [facility], [place] [address] [place], [place] [address]      Provider Number: [id number]  Attending Physician Name and Address:  Geradine Girt, DO  Relative Name and Phone Number:  Davina Poke  [phone]    Current Level of Care: Hospital Recommended Level of Care: [place] Prior Approval Number:    Date Approved/Denied:   PASRR Number: [id number]  Discharge Plan: SNF    Current Diagnoses: Patient Active Problem List   Diagnosis Date Noted  . Cardiac arrhythmia 08/07/2019  . SVT (supraventricular tachycardia) ([place]) 08/07/2019  . Hypomagnesemia 08/07/2019  . Aortic atherosclerosis ([place]) 08/07/2019  . OSA (obstructive sleep apnea) 11/26/2017  . Iron deficiency anemia due to chronic blood loss 08/08/2017  . Sepsis secondary to UTI ([place]) 06/17/2017  . Thrombocytopenia ([place]) 06/17/2017  . Acute urinary retention 06/17/2017  . Anemia in chronic renal disease 05/07/2017  . V-tach ([place]) 07/02/2016  . Protein-calorie malnutrition, severe 07/02/2016  . Bilateral lower extremity edema 07/01/2016  . Diffuse non-Hodgkin's lymphoma of testis ([place]) 09/28/2015  . Lumbosacral spondylosis with radiculopathy 02/07/2015  . At high risk for falls 05/05/2014  . Encounter for therapeutic drug monitoring 03/15/2013  . Chest pain 02/03/2013  . Obesity (BMI 30-39.9) 09/21/2012  . Type 2 diabetes mellitus with diabetic neuropathy, unspecified ([place]) 07/03/2012  . Chronic kidney disease, stage III (moderate) 07/03/2012  . Restless leg syndrome 12/04/2011  . Long term current use of anticoagulant therapy 07/15/2011  . Paroxysmal atrial fibrillation ([place]) 06/21/2011  . Upper extremity weakness  06/21/2011  . Diabetic peripheral neuropathy ([place]) 12/17/2010  . Hypotestosteronism 05/07/2010  . LEG CRAMPS, IDIOPATHIC 08/14/2009  . Insomnia 08/14/2009  . Malignant neoplasm of skin of face 08/14/2009  . OBESITY 08/02/2009  . HYPERTENSION, HEART CONTROLLED W/O ASSOC CHF 03/28/2008  . G E R D 01/12/2007  . Morbid obesity ([place]) 09/15/2006  . Hyperlipidemia, mixed 07/01/2006  . Essential hypertension 07/01/2006  . Coronary atherosclerosis 07/01/2006  . OSTEOARTHRITIS 07/01/2006    Orientation RESPIRATION BLADDER Height & Weight     Self, Time, Situation, Place  O2 (2 liters) Incontinent, Indwelling catheter (suprupubic catheter) Weight: (!) 108 kg Height:  5' 9"  (175.3 cm)  BEHAVIORAL SYMPTOMS/MOOD NEUROLOGICAL BOWEL NUTRITION STATUS      Continent Diet (see dc summary)  AMBULATORY STATUS COMMUNICATION OF NEEDS Skin   Limited Assist Verbally Normal                       Personal Care Assistance Level of Assistance  Bathing, Feeding, Dressing Bathing Assistance: Limited assistance Feeding assistance: Independent Dressing Assistance: Maximum assistance     Functional Limitations Info  Sight, Hearing, Speech Sight Info: Adequate Hearing Info: Adequate Speech Info: Adequate    SPECIAL CARE FACTORS FREQUENCY  PT (By licensed PT), OT (By licensed OT)     PT Frequency: 5x/week OT Frequency: 5x/week            Contractures Contractures Info: Not present    Additional Factors Info  Code Status, Allergies Code Status Info: FULL Allergies Info: Latex, Ace Inhibitors, Codeine, Penicillins           Current Medications (08/10/2019):  This is the current hospital active  medication list Current Facility-Administered Medications  Medication Dose Route Frequency Provider Last Rate Last Admin  . 0.9 %  sodium chloride infusion  250 mL Intravenous PRN Donnamae Jude, MD      . acetaminophen (TYLENOL) tablet 1,000 mg  1,000 mg Oral TID Vann, Jessica U, DO   1,000 mg  at 08/10/19 0600  . albuterol (PROVENTIL) (2.5 MG/3ML) 0.083% nebulizer solution 3 mL  3 mL Inhalation Q4H PRN Donnamae Jude, MD      . atorvastatin (LIPITOR) tablet 40 mg  40 mg Oral Daily Donnamae Jude, MD   40 mg at 08/10/19 0837  . Chlorhexidine Gluconate Cloth 2 % PADS 6 each  6 each Topical Daily Vann, Jessica U, DO      . docusate sodium (COLACE) capsule 100 mg  100 mg Oral BID PRN Donnamae Jude, MD      . furosemide (LASIX) tablet 40 mg  40 mg Oral Daily Eulogio Bear U, DO   40 mg at 08/10/19 0836  . gabapentin (NEURONTIN) capsule 600 mg  600 mg Oral TID Donnamae Jude, MD   600 mg at 08/10/19 0837  . insulin aspart (novoLOG) injection 0-15 Units  0-15 Units Subcutaneous TID WC Donnamae Jude, MD   2 Units at 08/10/19 1247  . melatonin tablet 1.5 mg  1.5 mg Oral QHS Donnamae Jude, MD   1.5 mg at 08/09/19 2104  . metoprolol succinate (TOPROL-XL) 24 hr tablet 25 mg  25 mg Oral Daily Donnamae Jude, MD   25 mg at 08/10/19 0836  . nitroGLYCERIN (NITROSTAT) SL tablet 0.4 mg  0.4 mg Sublingual Q5 min PRN Donnamae Jude, MD      . pantoprazole (PROTONIX) EC tablet 40 mg  40 mg Oral q AM Donnamae Jude, MD   40 mg at 08/10/19 0600  . polyethylene glycol (MIRALAX / GLYCOLAX) packet 17 g  17 g Oral Daily Donnamae Jude, MD      . potassium chloride SA (KLOR-CON) CR tablet 20 mEq  20 mEq Oral q AM Chriss Czar, MD   20 mEq at 08/10/19 0600  . pramipexole (MIRAPEX) tablet 3 mg  3 mg Oral QPM Donnamae Jude, MD   3 mg at 08/09/19 1726  . promethazine (PHENERGAN) tablet 12.5 mg  12.5 mg Oral Q6H PRN Donnamae Jude, MD      . sodium chloride 0.9 % bolus 250 mL  250 mL Intravenous Once PRN [place], Scarlette Shorts T, NP      . sodium chloride flush (NS) 0.9 % injection 3 mL  3 mL Intravenous Q12H Donnamae Jude, MD   3 mL at 08/10/19 0840  . sodium chloride flush (NS) 0.9 % injection 3 mL  3 mL Intravenous PRN Donnamae Jude, MD   3 mL at 08/07/19 2223  . vitamin B-12 (CYANOCOBALAMIN) tablet 1,000 mcg  1,000  mcg Oral Daily Eulogio Bear U, DO   1,000 mcg at 08/10/19 0837  . warfarin (COUMADIN) tablet 5 mg  5 mg Oral ONCE-1600 Vann, Jessica U, DO      . Warfarin - Pharmacist Dosing Inpatient   Does not apply q1600 Geradine Girt, DO       Facility-Administered Medications Ordered in Other Encounters  Medication Dose Route Frequency Provider Last Rate Last Admin  . acetaminophen (TYLENOL) tablet 650 mg  650 mg Oral Once [place], Holli Humbles, NP      . pegfilgrastim (NEULASTA ONPRO  KIT) injection 6 mg  6 mg Subcutaneous Once Volanda Napoleon, MD      . sodium chloride flush (NS) 0.9 % injection 10 mL  10 mL Intracatheter PRN Volanda Napoleon, MD   10 mL at 02/27/17 1503  . sodium chloride flush (NS) 0.9 % injection 10 mL  10 mL Intravenous PRN Volanda Napoleon, MD   10 mL at 05/07/17 1950  . sodium chloride flush (NS) 0.9 % injection 10 mL  10 mL Intravenous PRN [place], Holli Humbles, NP   10 mL at 04/14/19 9326     Discharge Medications: Please see discharge summary for a list of discharge medications.  Relevant Imaging Results:  Relevant Lab Results:   Additional Information [phone] - has una boots on just for swelling no skin breakage.  Zenon Mayo, RN

## 2019-08-10 NOTE — Progress Notes (Signed)
PT Note   Pt is now agreeable to ST-SNF. Family is not able to provide assistance for all mobility as he currently requires. Updated recommendation for SNF.   [place] Pager [phone] Office [phone]

## 2019-08-10 NOTE — Progress Notes (Signed)
Orthopedic Tech Progress Note Patient Details:  Gary Yates January 29, 1933 [id number]  Ortho Devices Type of Ortho Device: Louretta [place] boot Ortho Device/Splint Location: BLE Ortho Device/Splint Interventions: Ordered, Application   Post Interventions Patient Tolerated: Well Instructions Provided: Care of [place] 08/10/2019, 8:57 AM

## 2019-08-10 NOTE — Plan of Care (Signed)

## 2019-08-11 DIAGNOSIS — K219 Gastro-esophageal reflux disease without esophagitis: Secondary | ICD-10-CM

## 2019-08-11 DIAGNOSIS — I2510 Atherosclerotic heart disease of native coronary artery without angina pectoris: Secondary | ICD-10-CM

## 2019-08-11 DIAGNOSIS — N1831 Chronic kidney disease, stage 3a: Secondary | ICD-10-CM

## 2019-08-11 DIAGNOSIS — R600 Localized edema: Secondary | ICD-10-CM

## 2019-08-11 DIAGNOSIS — G4733 Obstructive sleep apnea (adult) (pediatric): Secondary | ICD-10-CM

## 2019-08-11 DIAGNOSIS — I10 Essential (primary) hypertension: Secondary | ICD-10-CM

## 2019-08-11 DIAGNOSIS — Z7901 Long term (current) use of anticoagulants: Secondary | ICD-10-CM

## 2019-08-11 DIAGNOSIS — E1142 Type 2 diabetes mellitus with diabetic polyneuropathy: Secondary | ICD-10-CM

## 2019-08-11 DIAGNOSIS — D631 Anemia in chronic kidney disease: Secondary | ICD-10-CM

## 2019-08-11 DIAGNOSIS — Z794 Long term (current) use of insulin: Secondary | ICD-10-CM

## 2019-08-11 DIAGNOSIS — E1140 Type 2 diabetes mellitus with diabetic neuropathy, unspecified: Secondary | ICD-10-CM

## 2019-08-11 DIAGNOSIS — E782 Mixed hyperlipidemia: Secondary | ICD-10-CM

## 2019-08-11 DIAGNOSIS — I480 Paroxysmal atrial fibrillation: Secondary | ICD-10-CM

## 2019-08-11 DIAGNOSIS — I471 Supraventricular tachycardia: Principal | ICD-10-CM

## 2019-08-11 DIAGNOSIS — I700 Atherosclerosis of aorta: Secondary | ICD-10-CM

## 2019-08-11 DIAGNOSIS — C8589 Other specified types of non-Hodgkin lymphoma, extranodal and solid organ sites: Secondary | ICD-10-CM

## 2019-08-11 LAB — GLUCOSE, CAPILLARY
Glucose-Capillary: 163 mg/dL — ABNORMAL HIGH (ref 70–99)
Glucose-Capillary: 133 mg/dL — ABNORMAL HIGH (ref 70–99)
Glucose-Capillary: 166 mg/dL — ABNORMAL HIGH (ref 70–99)

## 2019-08-11 LAB — BASIC METABOLIC PANEL
Anion gap: 11 (ref 5–15)
BUN: 13 mg/dL (ref 8–23)
CO2: 26 mmol/L (ref 22–32)
Calcium: 8.7 mg/dL — ABNORMAL LOW (ref 8.9–10.3)
Chloride: 97 mmol/L — ABNORMAL LOW (ref 98–111)
Creatinine, Ser: 1.17 mg/dL (ref 0.61–1.24)
GFR calc Af Amer: >60 mL/min (ref >60)
GFR calc non Af Amer: 57 mL/min — ABNORMAL LOW (ref >60)
Glucose, Bld: 142 mg/dL — ABNORMAL HIGH (ref 70–99)
Potassium: 3.9 mmol/L (ref 3.5–5.1)
Sodium: 134 mmol/L — ABNORMAL LOW (ref 135–145)

## 2019-08-11 LAB — PROTIME-INR
INR: 1.9 — ABNORMAL HIGH (ref 0.8–1.2)
Prothrombin Time: 20.8 seconds — ABNORMAL HIGH (ref 11.4–15.2)

## 2019-08-11 LAB — CBC
HCT: 29.1 % — ABNORMAL LOW (ref 39.0–52.0)
Hemoglobin: 9.6 g/dL — ABNORMAL LOW (ref 13.0–17.0)
MCH: 31.5 pg (ref 26.0–34.0)
MCHC: 33 g/dL (ref 30.0–36.0)
MCV: 95.4 fL (ref 80.0–100.0)
Platelets: 140 10*3/uL — ABNORMAL LOW (ref 150–400)
RBC: 3.05 MIL/uL — ABNORMAL LOW (ref 4.22–5.81)
RDW: 14.6 % (ref 11.5–15.5)
WBC: 5.9 10*3/uL (ref 4.0–10.5)
nRBC: 0 % (ref 0.0–0.2)

## 2019-08-11 LAB — SARS CORONAVIRUS 2 (TAT 6-24 HRS): SARS Coronavirus 2: NEGATIVE

## 2019-08-11 MED ORDER — METFORMIN HCL 500 MG PO TABS: 500.0000 mg | ORAL_TABLET | Freq: Two times a day (BID) | ORAL | 0 refills | Status: DC

## 2019-08-11 MED ORDER — ATORVASTATIN CALCIUM 40 MG PO TABS: 40.0000 mg | ORAL_TABLET | Freq: Every day | ORAL | 0 refills | Status: DC

## 2019-08-11 MED ORDER — METOPROLOL SUCCINATE ER 25 MG PO TB24: 25.0000 mg | ORAL_TABLET | Freq: Every day | ORAL | 0 refills | Status: DC

## 2019-08-11 MED ORDER — WARFARIN SODIUM 3 MG PO TABS
3.0000 mg | ORAL_TABLET | Freq: Once | ORAL | Status: AC
  Administered 2019-08-11: 3 mg via ORAL
  Filled 2019-08-11: qty 1

## 2019-08-11 MED ORDER — FUROSEMIDE 40 MG PO TABS: 40.0000 mg | ORAL_TABLET | Freq: Every day | ORAL | 0 refills | Status: DC

## 2019-08-11 MED ORDER — PANTOPRAZOLE SODIUM 40 MG PO TBEC: 40.0000 mg | DELAYED_RELEASE_TABLET | Freq: Every day | ORAL | 0 refills | Status: DC

## 2019-08-11 MED ORDER — CYANOCOBALAMIN 1000 MCG PO TABS: 1000.0000 ug | ORAL_TABLET | Freq: Every day | ORAL | 0 refills | Status: AC

## 2019-08-11 MED ORDER — GABAPENTIN 300 MG PO CAPS: 600.0000 mg | ORAL_CAPSULE | Freq: Three times a day (TID) | ORAL | 0 refills | Status: DC

## 2019-08-11 MED ORDER — POTASSIUM CHLORIDE CRYS ER 20 MEQ PO TBCR: 20.0000 meq | EXTENDED_RELEASE_TABLET | Freq: Every day | ORAL | 0 refills | Status: DC

## 2019-08-11 MED ORDER — PRAMIPEXOLE DIHYDROCHLORIDE 1.5 MG PO TABS: 3.0000 mg | ORAL_TABLET | Freq: Every evening | ORAL | 0 refills | Status: DC

## 2019-08-11 NOTE — Progress Notes (Signed)
Gary Yates for warfarin Indication: atrial fibrillation  Allergies  Allergen Reactions  . Latex Other (See Comments)    Reddens the skin  . Ace Inhibitors Cough  . Codeine Nausea Only and Rash       . Penicillins Rash    Childhood allergy Has patient had a PCN reaction causing immediate rash, facial/tongue/throat swelling, SOB or lightheadedness with hypotension: Yes Has patient had a PCN reaction causing severe rash involving mucus membranes or skin necrosis: Yes Has patient had a PCN reaction that required hospitalization No Has patient had a PCN reaction occurring within the last 10 years: No If all of the above answers are "NO", then may proceed with Cephalosporin use.     Patient Measurements: Height: 5\' 9"  (175.3 cm) Weight: (!) 108 kg (238 lb 1.6 oz) IBW/kg (Calculated) : 70.7  Vital Signs: Temp: 97.4 F (36.3 C) (08/04 1117) Temp Source: Oral (08/04 1117) BP: 135/70 (08/04 1117) Pulse Rate: 69 (08/04 1117)  Labs: Recent Labs    08/08/19 1454 08/09/19 0119 08/10/19 0145 08/11/19 0052  HGB  --  9.0*  --  9.6*  HCT  --  28.2*  --  29.1*  PLT  --  137*  --  140*  LABPROT  --  19.6* 20.2* 20.8*  INR  --  1.7* 1.8* 1.9*  CREATININE  --  1.27* 1.08 1.17  CKTOTAL 68  --   --   --     Estimated Creatinine Clearance: 56.9 mL/min (by C-G formula based on SCr of 1.17 mg/dL).   Medical History: Past Medical History:  Diagnosis Date  . Anemia in chronic renal disease 05/07/2017  . Anxiety   . Atrial fibrillation ([place])   . COPD (chronic obstructive pulmonary disease) ([place])    pt. denies  . Coronary artery disease    a. h/o Overlapping stents RCA;  b. 06/2011 Cath: patent stents, nonobs dzs, NL EF.  . Diabetic peripheral neuropathy ([place])   . Diffuse non-Hodgkin's lymphoma of testis ([place]) 09/28/2015  . DM (diabetes mellitus) ([place])    Type 2, peripheral neuropathy.  . Dyspnea    with exertion  . Dysrhythmia   . GERD  (gastroesophageal reflux disease)   . Headache   . History of bronchitis   . History of kidney stones   . History of radiation therapy 02/19/16 - 03/13/16   Testis/Scrotum: 32.4 Gy in 18 fractions  . History of radiation therapy 08/07/16-08/20/16   left adrenal gland mass treated to 30 Gy in 10 fractions  . Hyperlipidemia   . Hypertension   . Iron deficiency anemia due to chronic blood loss 08/08/2017  . Low testosterone   . Nephrolithiasis   . OSA (obstructive sleep apnea) 11/26/2017  . Osteoarthritis    shoulder  . Restless leg   . SVT (supraventricular tachycardia) ([place])   . Urinary frequency   . Wears partial dentures    upper and lower    Medications:  Scheduled:  . acetaminophen  1,000 mg Oral TID  . atorvastatin  40 mg Oral Daily  . Chlorhexidine Gluconate Cloth  6 each Topical Daily  . furosemide  40 mg Oral Daily  . gabapentin  600 mg Oral TID  . insulin aspart  0-15 Units Subcutaneous TID WC  . melatonin  1.5 mg Oral QHS  . metoprolol succinate  25 mg Oral Daily  . pantoprazole  40 mg Oral q AM  . polyethylene glycol  17 g Oral Daily  .  potassium chloride SA  20 mEq Oral q AM  . pramipexole  3 mg Oral QPM  . sodium chloride flush  3 mL Intravenous Q12H  . vitamin B-12  1,000 mcg Oral Daily  . Warfarin - Pharmacist Dosing Inpatient   Does not apply q1600    Assessment: 63 yof with history of Afib on warfarin who presented s/p fall and has increased SOB. INR on admission was subtherapeutic at 1.7.   INR remains subtherapeutic but trending up to 1.9. Hgb 9.6, plt 140- stable since last check. No s/sx of bleeding. Oral intake documented between 50% when charted.  PTA regimen is 2.5 mg daily except 5 mg STT.   Goal of Therapy:  INR 2-3 Monitor platelets by anticoagulation protocol: Yes   Plan:  Warfarin 3 mg tonight to trend into range  Monitor daily INR, CBC, and for s/sx of bleeding   Antonietta Jewel, PharmD, BCCCP Clinical Pharmacist  Phone: [phone] 08/11/2019  1:12 PM  Please check AMION for all [place] phone numbers After 10:00 PM, call [place] [phone]

## 2019-08-11 NOTE — Progress Notes (Signed)
Physical Therapy Treatment Patient Details Name: Gary Yates. MRN: [id number] DOB: 04-23-1933 Today's Date: 08/11/2019    History of Present Illness Pt adm with cardiac arrthymia/SVT. Pt with PMH - afib, copd, cad, non-Hodgkin's lymphoma in remission, HTN, CAD, OSA, DM, recent lt rib fx's, obesity    PT Comments    Pt finishing breakfast on arrival and able to walk in hall. Pt with LLE weakness which pt reports has been worsening for the last few months pt also with inability to dorsiflexion right ankle which pt was unaware of (1/5). Pt educated for fall and safety concerns with bil LE weakness, flexed posture and impaired balance with need for continued assist with gait and AD. Pt encourage to perform HEP throughout the day to maximize function.   HR 78-91    Follow Up Recommendations  SNF;Supervision/Assistance - 24 hour     Equipment Recommendations  None recommended by PT    Recommendations for Other Services       Precautions / Restrictions Precautions Precautions: Fall    Mobility  Bed Mobility               General bed mobility comments: in recliner on arrival and end of session  Transfers Overall transfer level: Needs assistance   Transfers: Sit to/from Stand Sit to Stand: Min guard         General transfer comment: cues for hand placement with decreased control for descent  Ambulation/Gait Ambulation/Gait assistance: Min guard Gait Distance (Feet): 180 Feet Assistive device: 4-wheeled walker Gait Pattern/deviations: Step-through pattern;Decreased stride length;Trunk flexed;Wide base of support;Decreased dorsiflexion - right   Gait velocity interpretation: 1.31 - 2.62 ft/sec, indicative of limited community ambulator General Gait Details: cues for posture and proximity to RW with improved gait distance. pt with decreased dorsiflexion RLE   Stairs             Wheelchair Mobility    Modified Rankin (Stroke Patients Only)        Balance Overall balance assessment: Needs assistance;History of Falls   Sitting balance-Leahy Scale: Fair     Standing balance support: Bilateral upper extremity supported;During functional activity Standing balance-Leahy Scale: Poor                              Cognition Arousal/Alertness: Awake/alert Behavior During Therapy: WFL for tasks assessed/performed Overall Cognitive Status: Impaired/Different from baseline Area of Impairment: Safety/judgement                         Safety/Judgement: Decreased awareness of deficits            Exercises General Exercises - Lower Extremity Long Arc Quad: AROM;Both;Seated;20 reps Hip ABduction/ADduction: AAROM;Both;Seated;15 reps Hip Flexion/Marching: AROM;Both;Seated;20 reps Toe Raises: AROM;Left;15 reps Heel Raises: AROM;Both;15 reps;Seated    General Comments        Pertinent Vitals/Pain Pain Assessment: No/denies pain    Home Living                      Prior Function            PT Goals (current goals can now be found in the care plan section) Progress towards PT goals: Progressing toward goals    Frequency    Min 3X/week      PT Plan Current plan remains appropriate    Co-evaluation  AM-PAC PT "6 Clicks" Mobility   Outcome Measure  Help needed turning from your back to your side while in a flat bed without using bedrails?: A Little Help needed moving from lying on your back to sitting on the side of a flat bed without using bedrails?: A Little Help needed moving to and from a bed to a chair (including a wheelchair)?: A Little Help needed standing up from a chair using your arms (e.g., wheelchair or bedside chair)?: A Little Help needed to walk in hospital room?: A Little Help needed climbing 3-5 steps with a railing? : A Little 6 Click Score: 18    End of Session Equipment Utilized During Treatment: Gait belt Activity Tolerance: Patient tolerated  treatment well Patient left: in chair;with call bell/phone within reach;with chair alarm set Nurse Communication: Mobility status PT Visit Diagnosis: Unsteadiness on feet (R26.81);Other abnormalities of gait and mobility (R26.89);History of falling (Z91.81)     Time: 0722-0751 PT Time Calculation (min) (ACUTE ONLY): 29 min  Charges:  $Gait Training: 8-22 mins $Therapeutic Exercise: 8-22 mins                     Mackinley Cassaday P, PT Acute Rehabilitation Services Pager: [phone] Office: [place] 08/11/2019, 9:43 AM

## 2019-08-11 NOTE — Progress Notes (Signed)
Discharge instructions reviewed with patient and placed in discharge envelop with written prescriptions.  Patient via Corey Harold to [employer].  Report called to [employer].

## 2019-08-11 NOTE — TOC Transition Note (Addendum)
Transition of Care [facility]) - CM/SW Discharge Note   Patient Details  Name: Gary Yates. MRN: [id number] Date of Birth: 11-15-1933  [facility] CM/SW Contact:  Zenon Mayo, RN Phone Number: 08/11/2019, 4:18 PM   Clinical Narrative:    Patient is for dc today to [facility], [name] notified of dc today, Ardelle Park with [employer] is aware rapid covid not done. She states to send patient.  He will be going to room 104, call report to [phone].  PTAR has been called.    Final next level of care: Skilled Nursing Facility Barriers to Discharge: No Barriers Identified   Patient Goals and CMS Choice Patient states their goals for this hospitalization and ongoing recovery are:: SNF CMS Medicare.gov Compare Post Acute Care list provided to:: Patient Represenative (must comment) Choice offered to / list presented to : Adult Children  Discharge Placement              Patient chooses bed at: [facility] Patient to be transferred to facility by: ptar Name of family member notified: [name] Patient and family notified of of transfer: 08/11/19  Discharge Plan and Services   Discharge Planning Services: CM Consult Post Acute Care Choice: [place]            DME Agency: NA       HH Arranged: NA          Social Determinants of Health (SDOH) Interventions     Readmission Risk Interventions Readmission Risk Prevention Plan 08/10/2019  Transportation Screening Complete  Medication Review Press photographer) Complete  PCP or Specialist appointment within 3-5 days of discharge Complete  HRI or Home Care Consult Complete  SW Recovery Care/Counseling Consult Complete  Palliative Care Screening Not [place] Complete  Some recent data might be hidden

## 2019-08-11 NOTE — Progress Notes (Signed)
PROGRESS NOTE    Gary Yates.  ELF:[id number] DOB: 1933/05/28 DOA: 08/07/2019 PCP: [name], MD    Brief Narrative:  Gary Hart. is an 86 y.o. male with medical history significant ofparoxysmal A. fib on Coumadin, COPD, CAD, non-Hodgkin's lymphoma in remission, hyperlipidemia, hypertension, coronary artery disease, obstructive sleep apnea, chronic renal insufficiency stage III diabetes mellitus type 2 who is status post fall approximately 2 weeks ago with left rib fractures and then has fallen twice more over the last few days. He has had increasing weakness and fatigue as well as lower extremity edema. He was seen by his PCP earlier this week and his Lasix was increased due to lower extremity swelling and some shortness of breath. He reports his Lasix has not been working. His last echo was in 2019 showed EF of 55 to 60%. He was noted increasing spasms and shaking in his lower extremities and inability to really bear weight. So he called EMS. He was found to be in a tachyarrhythmia during that time.   Assessment & Plan:   Principal Problem:   Cardiac arrhythmia Active Problems:   Hyperlipidemia, mixed   Morbid obesity (HCC)   Essential hypertension   Coronary atherosclerosis   G E R D   Diabetic peripheral neuropathy (HCC)   Paroxysmal atrial fibrillation (HCC)   Long term current use of anticoagulant therapy   Type 2 diabetes mellitus with diabetic neuropathy, unspecified (HCC)   Diffuse non-Hodgkin's lymphoma of testis (HCC)   Bilateral lower extremity edema   Anemia in chronic renal disease   OSA (obstructive sleep apnea)   SVT (supraventricular tachycardia) (HCC)   Hypomagnesemia   Aortic atherosclerosis ([place])   Supraventricular tachycardia Patient presents with weakness and intermittent spells of dizziness.  Was found to be in a tachyarrhythmia likely SVT by EMS.  Evaluated by cardiology, given patient is minimally symptomatic and brief spells of  lightheadedness may or may not be related to his SVT.  They discussed treatment options in detail, as long as he is not symptomatic further in his SVT is quiet; cardiology was suggested he continue his low-dose beta-blocker and undergo watchful waiting.  If has continued episodes of SVT correlates with his symptoms, then catheter ablation could be considered. --Continue metoprolol succinate 25 mg p.o. daily --Continue to monitor on telemetry; currently in sinus rhythm  Paroxysmal atrial fibrillation on Coumadin --Continue metoprolol succinate 25 mg p.o. daily for rate control --Continue Coumadin, pharmacy consulted for monitoring/dosing --Monitor INR daily --Continue to monitor on telemetry  Lower extremity edema Etiology likely venous insufficiency.  No signs of infection.  ABI noncompressible. --Continue Unna boots --Continue furosemide 40 mg p.o. daily --Patient referral to wound center for follow-up  Acute renal failure on CKD stage IIIb: Resolved Creatinine 1.88 on presentation.  Baseline creatinine 1.2-1.4.  Started on IV diuresis with improvement of creatinine back to baseline. --Monitor BMP daily  Hyperlipidemia --continue atorvastatin 40 mg daily  Hypertension --Continue metoprolol succinate 25 mg daily  Coronary artery disease No chest pain at present, Negative troponins  Diabetes- type 2 with hyperglycemia On Metformin at home, on hold due to kidney function and acute hospitalization --Sliding scale insulin --Diabetic diet  GERD --Protonix 40 mg daily  Diabetic neuropathy --Neurontin 600 mg TID  History of non-Hodgkin's lymphoma in remission --Last follow-up with Dr. Marin Olp in May 2021  Anemia of chronic disease Iron and B12 on the lower side --replaced  Hypomagnesemia -aggressive replacement  Obstructive sleep apnea -CPAP qhs?  Morbid obesity Could benefit from healthy lifestyle and weight loss. Estimated body mass index is 35.16 kg/m as  calculated from the following:   Height as of this encounter: 5\' 9"  (1.753 m).   Weight as of this encounter: 108 kg.    DVT prophylaxis:  Code Status:  Family Communication:   Disposition Plan:  Status is: Inpatient  Remains inpatient appropriate because:Unsafe d/c plan   Dispo: The patient is from: Home              Anticipated d/c is to: SNF              Anticipated d/c date is: 1 day              Patient currently is medically stable to d/c.   Consultants:   cardiology  Procedures:   none  Antimicrobials:   none   Subjective: Patient seen and examined bedside, resting comfortably in bedside chair.  No recurrent episodes of dizziness.  Awaiting SNF placement.  No other complaints or concerns at this time.  Denies headache, no fever/chills/night sweats, no nausea/vomiting/diarrhea, no chest pain, palpitations, no shortness of breath, no abdominal pain, no weakness.  No acute events overnight per nurse staff.  Objective: Vitals:   08/10/19 2348 08/11/19 0400 08/11/19 0809 08/11/19 1117  BP: 121/70 126/72 (!) 113/59 135/70  Pulse: 75  72 69  Resp: (!) 25  16 16   Temp: (!) 97.5 F (36.4 C) 97.8 F (36.6 C) 97.9 F (36.6 C) (!) 97.4 F (36.3 C)  TempSrc: Oral Oral Oral Oral  SpO2: 94% 96% 95% 94%  Weight:      Height:        Intake/Output Summary (Last 24 hours) at 08/11/2019 1245 Last data filed at 08/11/2019 0700 Gross per 24 hour  Intake 240 ml  Output 2250 ml  Net -2010 ml   Filed Weights   08/07/19 1059 08/07/19 2027 08/09/19 0556  Weight: (!) 105.2 kg (!) 104.5 kg (!) 108 kg    Examination:  General exam: Appears calm and comfortable  Respiratory system: Clear to auscultation. Respiratory effort normal.  Oxygenating well on room air Cardiovascular system: S1 & S2 heard, RRR. No JVD, murmurs, rubs, gallops or clicks.  Gastrointestinal system: Abdomen is nondistended, soft and nontender. No organomegaly or masses felt. Normal bowel sounds  heard. Central nervous system: Alert and oriented. No focal neurological deficits. Extremities: Symmetric 5 x 5 power.  Unna boots in place Skin: No rashes, lesions or ulcers Psychiatry: Judgement and insight appear normal. Mood & affect appropriate.     Data Reviewed: I have personally reviewed following labs and imaging studies  CBC: Recent Labs  Lab 08/07/19 1121 08/08/19 0534 08/09/19 0119 08/11/19 0052  WBC 6.4 6.0 5.0 5.9  HGB 9.6* 8.5* 9.0* 9.6*  HCT 30.9* 26.7* 28.2* 29.1*  MCV 98.4 97.1 97.9 95.4  PLT 170 141* 137* 867*   Basic Metabolic Panel: Recent Labs  Lab 08/07/19 1121 08/07/19 1642 08/08/19 0534 08/09/19 0119 08/10/19 0145 08/11/19 0052  NA 135  --  136 137 135 134*  K 3.6  --  3.7 4.1 4.0 3.9  CL 99  --  102 105 103 97*  CO2 24  --  25 24 25 26   GLUCOSE 164*  --  158* 146* 120* 142*  BUN 24*  --  21 16 12 13   CREATININE 1.88*  --  1.46* 1.27* 1.08 1.17  CALCIUM 8.4*  --  8.1* 8.6* 8.7* 8.7*  MG  --  1.2* 1.6* 2.2  --   --    GFR: Estimated Creatinine Clearance: 56.9 mL/min (by C-G formula based on SCr of 1.17 mg/dL). Liver Function Tests: Recent Labs  Lab 08/07/19 1817 08/08/19 0534  AST 12* 13*  ALT 8 6  ALKPHOS 76 69  BILITOT 0.5 0.6  PROT 6.4* 5.5*  ALBUMIN 2.9* 2.5*   No results for input(s): LIPASE, AMYLASE in the last 168 hours. No results for input(s): AMMONIA in the last 168 hours. Coagulation Profile: Recent Labs  Lab 08/07/19 1642 08/08/19 0534 08/09/19 0119 08/10/19 0145 08/11/19 0052  INR 1.7* 1.8* 1.7* 1.8* 1.9*   Cardiac Enzymes: Recent Labs  Lab 08/08/19 1454  CKTOTAL 68   BNP (last 3 results) No results for input(s): PROBNP in the last 8760 hours. HbA1C: Recent Labs    08/09/19 0119  HGBA1C 6.8*   CBG: Recent Labs  Lab 08/10/19 1102 08/10/19 1618 08/10/19 2102 08/11/19 0621 08/11/19 1114  GLUCAP 139* 115* 132* 163* 133*   Lipid Profile: No results for input(s): CHOL, HDL, LDLCALC, TRIG,  CHOLHDL, LDLDIRECT in the last 72 hours. Thyroid Function Tests: No results for input(s): TSH, T4TOTAL, FREET4, T3FREE, THYROIDAB in the last 72 hours. Anemia Panel: No results for input(s): VITAMINB12, FOLATE, FERRITIN, TIBC, IRON, RETICCTPCT in the last 72 hours. Sepsis Labs: No results for input(s): PROCALCITON, LATICACIDVEN in the last 168 hours.  Recent Results (from the past 240 hour(s))  SARS Coronavirus 2 by RT PCR (hospital order, performed in [facility] hospital lab) Nasopharyngeal Nasopharyngeal Swab     Status: None   Collection Time: 08/07/19  4:42 PM   Specimen: Nasopharyngeal Swab  Result Value Ref Range Status   SARS Coronavirus 2 NEGATIVE NEGATIVE Final    Comment: (NOTE) SARS-CoV-2 target nucleic acids are NOT DETECTED.  The SARS-CoV-2 RNA is generally detectable in upper and lower respiratory specimens during the acute phase of infection. The lowest concentration of SARS-CoV-2 viral copies this assay can detect is 250 copies / mL. A negative result does not preclude SARS-CoV-2 infection and should not be used as the sole basis for treatment or other patient management decisions.  A negative result may occur with improper specimen collection / handling, submission of specimen other than nasopharyngeal swab, presence of viral mutation(s) within the areas targeted by this assay, and inadequate number of viral copies (<250 copies / mL). A negative result must be combined with clinical observations, patient history, and epidemiological information.  Fact Sheet for Patients:   StrictlyIdeas.no  Fact Sheet for Healthcare Providers: BankingDealers.co.za  This test is not yet approved or  cleared by the [place] FDA and has been authorized for detection and/or diagnosis of SARS-CoV-2 by FDA under an Emergency Use Authorization (EUA).  This EUA will remain in effect (meaning this test can be used) for the duration of  the COVID-19 declaration under Section 564(b)(1) of the Act, 21 U.S.C. section 360bbb-3(b)(1), unless the authorization is terminated or revoked sooner.  Performed at [facility], [place] [address] [place], [place] [address]   MRSA PCR Screening     Status: None   Collection Time: 08/07/19  8:27 PM   Specimen: Nasal Mucosa; Nasopharyngeal  Result Value Ref Range Status   MRSA by PCR NEGATIVE NEGATIVE Final    Comment:        The GeneXpert MRSA Assay (FDA approved for NASAL specimens only), is one component of a comprehensive MRSA colonization surveillance program. It is not  intended to diagnose MRSA infection nor to guide or monitor treatment for MRSA infections. Performed at [facility], [place] [address], [place], [place] [address]          Radiology Studies: No results found.      Scheduled Meds:  acetaminophen  1,000 mg Oral TID   atorvastatin  40 mg Oral Daily   Chlorhexidine Gluconate Cloth  6 each Topical Daily   furosemide  40 mg Oral Daily   gabapentin  600 mg Oral TID   insulin aspart  0-15 Units Subcutaneous TID WC   melatonin  1.5 mg Oral QHS   metoprolol succinate  25 mg Oral Daily   pantoprazole  40 mg Oral q AM   polyethylene glycol  17 g Oral Daily   potassium chloride SA  20 mEq Oral q AM   pramipexole  3 mg Oral QPM   sodium chloride flush  3 mL Intravenous Q12H   vitamin B-12  1,000 mcg Oral Daily   Warfarin - Pharmacist Dosing Inpatient   Does not apply q1600   Continuous Infusions:  sodium chloride     sodium chloride       LOS: 4 days    Time spent: 36 minutes spent on chart review, discussion with nursing staff, consultants, updating family and interview/physical exam; more than 50% of that time was spent in counseling and/or coordination of care.    Myelle Poteat J [place] DO [facility] Available via Epic secure chat 7am-7pm After these hours, please refer to coverage provider listed on  [email or web address] 08/11/2019, 12:45 PM

## 2019-08-11 NOTE — Plan of Care (Signed)

## 2019-08-11 NOTE — TOC Progression Note (Addendum)
Transition of Care (TOC) - Progression Note    Patient Details  Name: Gary Yates. MRN: [id number] Date of Birth: 1933/10/31  Transition of Care [facility]) CM/SW Contact  Gary Mayo, RN Phone Number: 08/11/2019, 10:25 AM  Clinical Narrative:    NCM spoke with Gary Yates this am gave her the bed offers, she states she needs to speak to her brother, and her mom and the patient before she decides on the facility , she will call this NCM back.  Gary Yates called back and states they would like [facility], referral given to Gary Yates with [facility] and gave Gary Yates phone for contact.  NCM also updated [place] that [place] is the chosen facility.  13:30- NCM was notified by Gary Yates with [facility] that they have a resident that has been tested covid positive, so they will not be able to take anyone right now til they figure out what their plan is  For admitting patients. NCM informed Gary Yates the daughter, she is meeting with Gary Yates at 2 pm today, she will call NCM after the meeting. Also Gary Yates with [facility] says if he needs a cpap they will have to order it which may take a while.  13:52- NCM received auth from [facility] approved fro 3 days from 8/4 to 8/6, [place] fax number [phone].   14:11- NCM received call from [place], she states they would like to try [facility].  NCM contacted [place] with [facility] , she will call this NCM back after she checks on his information.  NCM contacted [place] to let them know that SNF will now be [facility], they states we have to restart auth and the new ref number is [id number].     Expected Discharge Plan: [place] Barriers to Discharge: Continued Medical Work up  Expected Discharge Plan and Services Expected Discharge Plan: [place]   Discharge Planning Services: CM Consult Post Acute Care Choice: [place] arrangements for the past 2 months: Single Family Home                    DME Agency: NA       HH Arranged: NA           Social Determinants of Health (SDOH) Interventions    Readmission Risk Interventions Readmission Risk Prevention Plan 08/10/2019  Transportation Screening Complete  Medication Review Press photographer) Complete  PCP or Specialist appointment within 3-5 days of discharge Complete  HRI or Home Care Consult Complete  SW Recovery Care/Counseling Consult Complete  Palliative Care Screening Not Applicable  Skilled Nursing Facility Complete  Some recent data might be hidden

## 2019-08-11 NOTE — Discharge Summary (Signed)
Physician Discharge Summary  Gary Yates. XKG:[id number] DOB: Dec 19, 1933 DOA: 08/07/2019  PCP: [name], MD  Admit date: 08/07/2019 Discharge date: 08/11/2019  Admitted From: Home Disposition: [facility]   Recommendations for Outpatient Follow-up:  1. Follow up with PCP in 1-2 weeks 2. Please obtain BMP in one week 3. Continue to follow INR to ensure back in therapeutic range of 2.0-3.0 for atrial fibrillation 4. Recommend outpatient sleep study with CPAP titration for suspected OSA  Home Health: N/A Equipment/Devices: N/A  Discharge Condition: Stable CODE STATUS: Full code Diet recommendation: Heart healthy/consistent carbohydrate diet  History of present illness:  Gary Frye. is an 86 y.o.malewith medical history significant ofparoxysmal A. fib on Coumadin, COPD, CAD, non-Hodgkin's lymphoma in remission, hyperlipidemia, hypertension, coronary artery disease, obstructive sleep apnea, chronic renal insufficiency stage III diabetes mellitus type 2 who is status post fall approximately 2 weeks ago with left rib fractures and then has fallen twice more over the last few days. He has had increasing weakness and fatigue as well as lower extremity edema. He was seen by his PCP earlier this week and his Lasix was increased due to lower extremity swelling and some shortness of breath. He reports his Lasix has not been working. His last echo was in 2019 showed EF of 55 to 60%. He was noted increasing spasms and shaking in his lower extremities and inability to really bear weight. So he called EMS. He was found to be in a tachyarrhythmia during that time.  Hospital course:  Supraventricular tachycardia Patient presents with weakness and intermittent spells of dizziness.  Was found to be in a tachyarrhythmia likely SVT by EMS.  Evaluated by cardiology, given patient is minimally symptomatic and brief spells of lightheadedness may or may not be related to his SVT.   They discussed treatment options in detail, as long as he is not symptomatic further in his SVT is quiet; cardiology was suggested he continue his low-dose beta-blocker and undergo watchful waiting.  If has continued episodes of SVT correlates with his symptoms, then catheter ablation could be considered. Continue metoprolol succinate 25 mg p.o. daily.  Was in sinus rhythm on discharge.  Paroxysmal atrial fibrillation on Coumadin Continue metoprolol succinate 25 mg p.o. daily for rate control. Continue Coumadin 2.5 mg p.o. daily Monday/Wednesday/Friday/Saturday and 5 mg p.o. daily on Sunday/Tuesday/Thursday.  Recommend follow INR closely following discharge.  INR 1.9 at time of discharge with a goal of 2.0-3.0.    Lower extremity edema Etiology likely venous insufficiency.  No signs of infection.  ABI noncompressible.  Continue Unna boots and furosemide 40 mg p.o. daily.  Will need referral to wound center for follow-up following SNF.  Acute renal failure on CKD stage IIIb: Resolved Creatinine 1.88 on presentation.  Baseline creatinine 1.2-1.4.  Started on IV diuresis with improvement of creatinine back to baseline.  Creatinine 1.17 at time of discharge.  Hyperlipidemia Continue atorvastatin 40 mg daily  Hypertension Continue metoprolol succinate 25 mg daily  Coronary artery disease No chest pain at present, Negative troponins  Diabetes- type 2 with hyperglycemia Continue Metformin 500 mg p.o. twice daily.    GERD Protonix 40 mg daily  Diabetic neuropathy --Neurontin 600 mgTID  History of non-Hodgkin's lymphoma in remission Last follow-up with Dr. Marin Olp in May 2021  Anemia of chronic disease Continue iron and B12 supplementation.  Hypomagnesemia Repleted during hospitalization  Obstructive sleep apnea Will need outpatient sleep study with CPAP titration   Morbid obesity Could benefit  from healthy lifestyle and weight loss. Estimated body mass index is 35.16  kg/m as calculated from the following: Height as of this encounter: 5\' 9"  (1.753 m). Weight as of this encounter: 108 kg.  Discharge Diagnoses:  Principal Problem:   Cardiac arrhythmia Active Problems:   Hyperlipidemia, mixed   Morbid obesity (HCC)   Essential hypertension   Coronary atherosclerosis   G E R D   Diabetic peripheral neuropathy (HCC)   Paroxysmal atrial fibrillation (HCC)   Long term current use of anticoagulant therapy   Type 2 diabetes mellitus with diabetic neuropathy, unspecified (HCC)   Diffuse non-Hodgkin's lymphoma of testis (HCC)   Bilateral lower extremity edema   Anemia in chronic renal disease   OSA (obstructive sleep apnea)   Aortic atherosclerosis ([place])    Discharge Instructions  Discharge Instructions    Call MD for:  difficulty breathing, headache or visual disturbances   Complete by: As directed    Call MD for:  extreme fatigue   Complete by: As directed    Call MD for:  persistant dizziness or light-headedness   Complete by: As directed    Call MD for:  persistant nausea and vomiting   Complete by: As directed    Call MD for:  severe uncontrolled pain   Complete by: As directed    Call MD for:  temperature >100.4   Complete by: As directed    Diet - low sodium heart healthy   Complete by: As directed    Increase activity slowly   Complete by: As directed      Allergies as of 08/11/2019      Reactions   Latex Other (See Comments)   Reddens the skin   Ace Inhibitors Cough   Codeine Nausea Only, Rash      Penicillins Rash   Childhood allergy Has patient had a PCN reaction causing immediate rash, facial/tongue/throat swelling, SOB or lightheadedness with hypotension: Yes Has patient had a PCN reaction causing severe rash involving mucus membranes or skin necrosis: Yes Has patient had a PCN reaction that required hospitalization No Has patient had a PCN reaction occurring within the last 10 years: No If all of the above answers  are "NO", then may proceed with Cephalosporin use.      Medication List    STOP taking these medications   lidocaine-prilocaine cream Commonly known as: EMLA   methylPREDNISolone 4 MG Tbpk tablet Commonly known as: MEDROL DOSEPAK     TAKE these medications   Accu-Chek Aviva Plus test strip Generic drug: glucose blood 1 each by Other route 3 (three) times daily. for testing   acetaminophen 500 MG tablet Commonly known as: TYLENOL Take 1,000 mg by mouth every 6 (six) hours as needed (for pain/fever/headaches.).   albuterol 108 (90 Base) MCG/ACT inhaler Commonly known as: VENTOLIN HFA Inhale 2 puffs into the lungs every 4 (four) hours as needed for wheezing or shortness of breath.   atorvastatin 40 MG tablet Commonly known as: LIPITOR Take 1 tablet (40 mg total) by mouth daily.   cyanocobalamin 1000 MCG tablet Take 1 tablet (1,000 mcg total) by mouth daily. Start taking on: August 12, 2019   docusate sodium 50 MG capsule Commonly known as: COLACE Take 1 capsule (50 mg total) by mouth 2 (two) times daily as needed (for constipation).   furosemide 40 MG tablet Commonly known as: LASIX Take 1 tablet (40 mg total) by mouth daily. Start taking on: August 12, 2019 What changed: when to  take this   gabapentin 300 MG capsule Commonly known as: NEURONTIN Take 2 capsules (600 mg total) by mouth 3 (three) times daily. What changed:   how much to take  when to take this   melatonin 1 MG Tabs tablet Take 1 tablet (1 mg total) by mouth at bedtime.   metFORMIN 500 MG tablet Commonly known as: GLUCOPHAGE Take 1 tablet (500 mg total) by mouth 2 (two) times daily with a meal.   metoprolol succinate 25 MG 24 hr tablet Commonly known as: TOPROL-XL Take 1 tablet (25 mg total) by mouth daily. Start taking on: August 12, 2019   nitroGLYCERIN 0.4 MG SL tablet Commonly known as: NITROSTAT Place 1 tablet (0.4 mg total) under the tongue every 5 (five) minutes as needed. Chest  pain What changed:   reasons to take this  additional instructions   nystatin powder Commonly known as: MYCOSTATIN/NYSTOP APPLY TO AFFECTED AREA 4 TIMES A DAY What changed: See the new instructions.   nystatin-triamcinolone ointment Commonly known as: MYCOLOG Apply 1 application topically 2 (two) times daily.   ondansetron 4 MG disintegrating tablet Commonly known as: Zofran ODT 4mg  ODT q4 hours prn nausea/vomit What changed:   how much to take  how to take this  when to take this  reasons to take this  additional instructions   pantoprazole 40 MG tablet Commonly known as: PROTONIX Take 1 tablet (40 mg total) by mouth daily. What changed: when to take this   polyethylene glycol 17 g packet Commonly known as: MIRALAX / GLYCOLAX Take 17 g by mouth daily.   potassium chloride SA 20 MEQ tablet Commonly known as: Klor-Con M20 Take 1 tablet (20 mEq total) by mouth daily. What changed: how much to take   pramipexole 1.5 MG tablet Commonly known as: MIRAPEX Take 2 tablets (3 mg total) by mouth every evening.   warfarin 5 MG tablet Commonly known as: COUMADIN Take as directed. If you are unsure how to take this medication, talk to your nurse or doctor. Original instructions: Take 1/2 tablet daily except take 1 tablet on Sun Tues and Thurs or Take as directed by anticoagulation clinic What changed:   how much to take  how to take this  when to take this  additional instructions       Follow-up Information    [name], MD Follow up on 09/01/2019.   Specialty: Family Medicine Why: 1:45 for hospital follow up Contact information: [address] [place] [address] [phone]              Allergies  Allergen Reactions  . Latex Other (See Comments)    Reddens the skin  . Ace Inhibitors Cough  . Codeine Nausea Only and Rash       . Penicillins Rash    Childhood allergy Has patient had a PCN reaction causing immediate rash,  facial/tongue/throat swelling, SOB or lightheadedness with hypotension: Yes Has patient had a PCN reaction causing severe rash involving mucus membranes or skin necrosis: Yes Has patient had a PCN reaction that required hospitalization No Has patient had a PCN reaction occurring within the last 10 years: No If all of the above answers are "NO", then may proceed with Cephalosporin use.     Consultations:  None   Procedures/Studies: DG Ribs Unilateral W/Chest Left  Result Date: 07/12/2019 CLINICAL DATA:  Status post fall. EXAM: LEFT RIBS AND CHEST - 3+ VIEW COMPARISON:  Jun 05, 2017 FINDINGS: There is stable  right-sided venous Port-A-Cath positioning. No fracture or other bone lesions are seen involving the ribs. There is no evidence of pneumothorax or pleural effusion. Mild atelectasis is seen within the bilateral lung bases. The cardiac silhouette is mildly enlarged. IMPRESSION: 1. No evidence of rib fracture or other bone lesions. 2. Mild bibasilar atelectasis. Electronically Signed   By: Virgina Norfolk M.D.   On: 07/12/2019 19:05   CT HEAD WO CONTRAST  Result Date: 07/23/2019 CLINICAL DATA:  Dizziness, nonspecific EXAM: CT HEAD WITHOUT CONTRAST TECHNIQUE: Contiguous axial images were obtained from the base of the skull through the vertex without intravenous contrast. COMPARISON:  June 19, 2011 FINDINGS: Brain: No evidence of acute territorial infarction, hemorrhage, hydrocephalus,extra-axial collection or mass lesion/mass effect. There is dilatation the ventricles and sulci consistent with age-related atrophy. Low-attenuation changes in the deep white matter consistent with small vessel ischemia. Vascular: No hyperdense vessel or unexpected calcification. Skull: The skull is intact. No fracture or focal lesion identified. Sinuses/Orbits: Mucosal thickening seen within the right maxillary sinus and ethmoid air cells. The orbits and globes intact. Other: None IMPRESSION: No acute intracranial  abnormality. Findings consistent with age related atrophy and chronic small vessel ischemia Ethmoid air cell and right maxillary sinusitis Electronically Signed   By: Prudencio Pair M.D.   On: 07/23/2019 17:33   US RENAL  Result Date: 08/07/2019 CLINICAL DATA:  Acute on chronic renal failure EXAM: RENAL / URINARY TRACT ULTRASOUND COMPLETE COMPARISON:  Jun 06, 2017 FINDINGS: Right Kidney: Renal measurements: 11.6 x 5.2 x 6.1 cm = volume: 192 mL . Echogenicity within normal limits. Mild diffuse cortical thinning is seen. There are 2 anechoic cystic lesions seen within the right kidney the largest within the upper pole measuring 4.1 x 3.5 x 3.5 cm with internal septations. Left Kidney: Renal measurements: 12.2 x 5.4 x 6.4 cm = volume: 219 mL. Echogenicity within normal limits. Diffuse cortical thinning is noted. There is an anechoic cyst seen within the left kidney measuring 4.4 x 3.9 x 4.7 cm. Bladder: Decompressed with a suprapubic catheter. Other: None. IMPRESSION: Bilateral simple renal cysts. No hydronephrosis or other acute abnormality Electronically Signed   By: Prudencio Pair M.D.   On: 08/07/2019 22:04   CT CHEST ABDOMEN PELVIS W CONTRAST  Result Date: 07/23/2019 CLINICAL DATA:  [place] 1 week ago, anticoagulated, left-sided rib pain, pain with inspiration EXAM: CT CHEST, ABDOMEN, AND PELVIS WITH CONTRAST TECHNIQUE: Multidetector CT imaging of the chest, abdomen and pelvis was performed following the standard protocol during bolus administration of intravenous contrast. CONTRAST:  51mL OMNIPAQUE IOHEXOL 300 MG/ML  SOLN COMPARISON:  06/18/2017, 07/12/2019 FINDINGS: CT CHEST FINDINGS Cardiovascular: The heart and great vessels are unremarkable without pericardial effusion. Moderate atherosclerosis of the aorta and coronary vasculature. Mediastinum/Nodes: No enlarged mediastinal, hilar, or axillary lymph nodes. Thyroid gland, trachea, and esophagus demonstrate no significant findings. Lungs/Pleura: No airspace  disease, effusion, or pneumothorax. Central airways are patent. Musculoskeletal: There are minimally displaced left anterior eighth and ninth rib fractures at the costochondral junctions. No other acute displaced fractures. Reconstructed images demonstrate no additional findings. CT ABDOMEN PELVIS FINDINGS Hepatobiliary: No focal liver abnormality is seen. Status post cholecystectomy. No biliary dilatation. Pancreas: Unremarkable. No pancreatic ductal dilatation or surrounding inflammatory changes. Spleen: Normal in size without focal abnormality. Adrenals/Urinary Tract: There is bilateral renal cortical atrophy. A simple cyst measuring 4.7 cm is unchanged within the ventral aspect left kidney. Within the posterior lower pole right kidney a 4.3 cm hypodensity is again identified. Attenuation is  increased since prior study, measuring 31 Hounsfield units. On delayed imaging attenuation is 28 Hounsfield units. Overall, favor hemorrhagic or proteinaceous cyst. There is a stable simple cyst off the medial aspect lower pole right kidney. No urinary tract calculi or obstruction. Suprapubic catheter decompresses the urinary bladder. The adrenals are unremarkable. Stomach/Bowel: No bowel obstruction or ileus. Moderate retained stool throughout the colon. No wall thickening or inflammatory change. Vascular/Lymphatic: Aortic atherosclerosis. No enlarged abdominal or pelvic lymph nodes. Reproductive: Prostate is enlarged but stable. Other: No free fluid or free gas. No abdominal wall hernia. Musculoskeletal: No acute displaced fractures. Reconstructed images demonstrate no additional findings. IMPRESSION: 1. Minimally displaced left anterior eighth and ninth rib fractures at the costochondral junctions. 2. Otherwise no acute intrathoracic, intra-abdominal, or intrapelvic trauma. 3. Increased density within a right renal hypodensity, previously shown to represent a simple cyst. Favor proteinaceous or hemorrhagic cyst on today's  exam. If further evaluation is desired, nonemergent outpatient dedicated renal MRI or renal ultrasound could be considered. 4. Enlarged prostate, with interval placement of a suprapubic catheter decompressing the urinary bladder. 5. Aortic Atherosclerosis (ICD10-I70.0). Electronically Signed   By: Randa Ngo M.D.   On: 07/23/2019 21:55   DG Chest Portable 1 View  Result Date: 08/07/2019 CLINICAL DATA:  Shortness of breath, weakness and tremors EXAM: PORTABLE CHEST 1 VIEW COMPARISON:  Radiograph 07/12/2019, CT 07/23/2019 FINDINGS: Nondisplaced lateral eighth rib fracture, possibly acute to subacute with history of recent fall. No other acute or suspicious osseous or soft tissue abnormality. Degenerative changes are present in the imaged spine and shoulders. Atelectatic changes are present in the left lung base with more chronic elevation of the left hemidiaphragm similar to priors. Chronically coarsened interstitial changes are similar to comparison exams. No consolidation, features of edema, pneumothorax, or effusion. Stable cardiomediastinal contours with a borderline enlarged cardiac silhouette and a calcified, tortuous aorta. Right IJ approach Port-A-Cath tip terminates in the lower SVC. Telemetry leads and pacer pads overlie the chest. IMPRESSION: 1. Nondisplaced lateral eighth rib fracture, possibly acute to subacute with history of recent fall. 2. Chronically coarsened interstitial changes and left basilar atelectasis. 3. No other acute cardiopulmonary abnormalities. 4. Stable cardiomegaly. 5.  Aortic Atherosclerosis (ICD10-I70.0). Electronically Signed   By: Lovena Le M.D.   On: 08/07/2019 16:43   VAS [place] ABI WITH/WO TBI  Result Date: 08/09/2019 LOWER EXTREMITY DOPPLER STUDY Indications: Significant edema, provider would like to place Unna boots in              patient with diabetes. High Risk         Hypertension, hyperlipidemia, Diabetes, coronary artery Factors:          disease.  Comparison  Study: Prior study from 09/28/15 is available for comparison Performing Technologist: Sharion Dove RVS  Examination Guidelines: A complete evaluation includes at minimum, Doppler waveform signals and systolic blood pressure reading at the level of bilateral brachial, anterior tibial, and posterior tibial arteries, when vessel segments are accessible. Bilateral testing is considered an integral part of a complete examination. Photoelectric Plethysmograph (PPG) waveforms and toe systolic pressure readings are included as required and additional duplex testing as needed. Limited examinations for reoccurring indications may be performed as noted.  ABI Findings: +---------+------------------+-----+---------+--------+ Right    Rt Pressure (mmHg)IndexWaveform Comment  +---------+------------------+-----+---------+--------+ Brachial 127                    triphasic         +---------+------------------+-----+---------+--------+ PTA  194               1.52 biphasic          +---------+------------------+-----+---------+--------+ DP       254               1.98 biphasic          +---------+------------------+-----+---------+--------+ Great Toe127               0.99                   +---------+------------------+-----+---------+--------+ +---------+------------------+-----+---------+-------+ Left     Lt Pressure (mmHg)IndexWaveform Comment +---------+------------------+-----+---------+-------+ Brachial 128                    triphasic        +---------+------------------+-----+---------+-------+ PTA      181               1.41 biphasic         +---------+------------------+-----+---------+-------+ DP       254               1.98 biphasic         +---------+------------------+-----+---------+-------+ Great Toe148               1.16                  +---------+------------------+-----+---------+-------+ +-------+-----------+-----------+------------+------------+  ABI/TBIToday's ABIToday's TBIPrevious ABIPrevious TBI +-------+-----------+-----------+------------+------------+ Right  1.9        0.99       1.2                      +-------+-----------+-----------+------------+------------+ Left   1.9        1.16       1.2                      +-------+-----------+-----------+------------+------------+ Arterial wall calcification precludes accurate ankle pressures and ABIs. Right ABIs appear increased. Left ABIs appear increased. Vessels appear calcified with normal waveforms.  Summary: Right: Resting right ankle-brachial index indicates noncompressible right lower extremity arteries. The right toe-brachial index is normal. TBIs are unreliable. ABIs are unreliable. Vessels appear calcified with normal waveforms. Left: Resting left ankle-brachial index indicates noncompressible left lower extremity arteries. The left toe-brachial index is normal. TBIs are unreliable. ABIs are unreliable.  *See table(s) above for measurements and observations.  Electronically signed by Ruta Hinds MD on 08/09/2019 at 3:48:05 PM.    Final    ECHOCARDIOGRAM COMPLETE  Result Date: 08/08/2019    ECHOCARDIOGRAM REPORT   Patient Name:   Gary Yates. Date of Exam: 08/08/2019 Medical Rec #:  [id number]         Height:       69.0 in Accession #:    [id number]        Weight:       230.4 lb Date of Birth:  [birth year]         BSA:          2.194 m Patient Age:    64 years          BP:           99/49 mmHg Patient Gender: M                 HR:           69 bpm. Exam Location:  Inpatient Procedure: 2D Echo Indications:    palpitations 785.1  History:        Patient has prior history of Echocardiogram examinations, most                 recent 06/10/2017. CAD, Arrythmias:paroxysmal afib,                 Signs/Symptoms:lower extremity edema; Risk Factors:Hypertension,                 Dyslipidemia, Sleep Apnea and Diabetes.  Sonographer:    Johny Chess Referring Phys: [place]  1. Left ventricular ejection fraction, by estimation, is 60 to 65%. The left ventricle has normal function. The left ventricle has no regional wall motion abnormalities. Left ventricular diastolic parameters are consistent with Grade I diastolic dysfunction (impaired relaxation).  2. Right ventricular systolic function is normal. The right ventricular size is normal. There is normal pulmonary artery systolic pressure.  3. The mitral valve is normal in structure. No evidence of mitral valve regurgitation. No evidence of mitral stenosis.  4. The aortic valve is normal in structure. Aortic valve regurgitation is not visualized. Mild to moderate aortic valve sclerosis/calcification is present, without any evidence of aortic stenosis.  5. The inferior vena cava is normal in size with greater than 50% respiratory variability, suggesting right atrial pressure of 3 mmHg. FINDINGS  Left Ventricle: Left ventricular ejection fraction, by estimation, is 60 to 65%. The left ventricle has normal function. The left ventricle has no regional wall motion abnormalities. The left ventricular internal cavity size was normal in size. There is  no left ventricular hypertrophy. Left ventricular diastolic parameters are consistent with Grade I diastolic dysfunction (impaired relaxation). Right Ventricle: The right ventricular size is normal. No increase in right ventricular wall thickness. Right ventricular systolic function is normal. There is normal pulmonary artery systolic pressure. The tricuspid regurgitant velocity is 2.02 m/s, and  with an assumed right atrial pressure of 3 mmHg, the estimated right ventricular systolic pressure is 89.3 mmHg. Left Atrium: Left atrial size was normal in size. Right Atrium: Right atrial size was normal in size. Pericardium: There is no evidence of pericardial effusion. Mitral Valve: The mitral valve is normal in structure. Normal mobility of the mitral valve leaflets. Mild mitral annular  calcification. No evidence of mitral valve regurgitation. No evidence of mitral valve stenosis. Tricuspid Valve: The tricuspid valve is normal in structure. Tricuspid valve regurgitation is not demonstrated. No evidence of tricuspid stenosis. Aortic Valve: The aortic valve is normal in structure. Aortic valve regurgitation is not visualized. Mild to moderate aortic valve sclerosis/calcification is present, without any evidence of aortic stenosis. Aortic valve mean gradient measures 10.0 mmHg.  Aortic valve peak gradient measures 17.7 mmHg. Aortic valve area, by VTI measures 1.42 cm. Pulmonic Valve: The pulmonic valve was normal in structure. Pulmonic valve regurgitation is not visualized. No evidence of pulmonic stenosis. Aorta: The aortic root is normal in size and structure. Venous: The inferior vena cava is normal in size with greater than 50% respiratory variability, suggesting right atrial pressure of 3 mmHg. IAS/Shunts: No atrial level shunt detected by color flow Doppler.  LEFT VENTRICLE PLAX 2D LVIDd:         5.10 cm  Diastology LVIDs:         3.20 cm  LV e' lateral:   8.59 cm/s LV PW:         1.10 cm  LV E/e' lateral: 11.0 LV IVS:        1.00 cm  LV e' medial:    7.29 cm/s LVOT diam:     2.10 cm  LV E/e' medial:  12.9 LV SV:         62 LV SV Index:   28 LVOT Area:     3.46 cm  RIGHT VENTRICLE             IVC RV S prime:     12.90 cm/s  IVC diam: 2.30 cm TAPSE (M-mode): 2.0 cm LEFT ATRIUM             Index       RIGHT ATRIUM           Index LA diam:        2.90 cm 1.32 cm/m  RA Area:     14.40 cm LA Vol (A2C):   52.7 ml 24.02 ml/m RA Volume:   31.90 ml  14.54 ml/m LA Vol (A4C):   42.9 ml 19.55 ml/m LA Biplane Vol: 49.7 ml 22.65 ml/m  AORTIC VALVE AV Area (Vmax):    1.46 cm AV Area (Vmean):   1.33 cm AV Area (VTI):     1.42 cm AV Vmax:           210.50 cm/s AV Vmean:          150.000 cm/s AV VTI:            0.436 m AV Peak Grad:      17.7 mmHg AV Mean Grad:      10.0 mmHg LVOT Vmax:         88.55  cm/s LVOT Vmean:        57.550 cm/s LVOT VTI:          0.179 m LVOT/AV VTI ratio: 0.41  AORTA Ao Asc diam: 3.10 cm MITRAL VALVE                TRICUSPID VALVE MV Area (PHT): 2.83 cm     TR Peak grad:   16.3 mmHg MV Decel Time: 268 msec     TR Vmax:        202.00 cm/s MV E velocity: 94.30 cm/s MV A velocity: 119.00 cm/s  SHUNTS MV E/A ratio:  0.79         Systemic VTI:  0.18 m                             Systemic Diam: 2.10 cm Candee Furbish MD Electronically signed by Candee Furbish MD Signature Date/Time: 08/08/2019/11:52:00 AM    Final       Subjective: Patient seen and examined bedside, resting comfortably in bedside chair.  No recurrent episodes of dizziness.  Awaiting SNF placement.  No other complaints or concerns at this time.  Denies headache, no fever/chills/night sweats, no nausea/vomiting/diarrhea, no chest pain, palpitations, no shortness of breath, no abdominal pain, no weakness.  No acute events overnight per nurse staff.  Discharge Exam: Vitals:   08/11/19 0809 08/11/19 1117  BP: (!) 113/59 135/70  Pulse: 72 69  Resp: 16 16  Temp: 97.9 F (36.6 C) (!) 97.4 F (36.3 C)  SpO2: 95% 94%   Vitals:   08/10/19 2348 08/11/19 0400 08/11/19 0809 08/11/19 1117  BP: 121/70 126/72 (!) 113/59 135/70  Pulse: 75  72 69  Resp: (!) 25  16 16   Temp: (!) 97.5 F (36.4 C) 97.8 F (36.6 C) 97.9 F (36.6 C) (!) 97.4 F (36.3 C)  TempSrc:  Oral Oral Oral Oral  SpO2: 94% 96% 95% 94%  Weight:      Height:        General: Pt is alert, awake, not in acute distress Cardiovascular: RRR, S1/S2 +, no rubs, no gallops Respiratory: CTA bilaterally, no wheezing, no rhonchi Abdominal: Soft, NT, ND, bowel sounds + Extremities: Unna boots in place, no cyanosis    The results of significant diagnostics from this hospitalization (including imaging, microbiology, ancillary and laboratory) are listed below for reference.     Microbiology: Recent Results (from the past 240 hour(s))  SARS Coronavirus 2 by  RT PCR (hospital order, performed in [facility] hospital lab) Nasopharyngeal Nasopharyngeal Swab     Status: None   Collection Time: 08/07/19  4:42 PM   Specimen: Nasopharyngeal Swab  Result Value Ref Range Status   SARS Coronavirus 2 NEGATIVE NEGATIVE Final    Comment: (NOTE) SARS-CoV-2 target nucleic acids are NOT DETECTED.  The SARS-CoV-2 RNA is generally detectable in upper and lower respiratory specimens during the acute phase of infection. The lowest concentration of SARS-CoV-2 viral copies this assay can detect is 250 copies / mL. A negative result does not preclude SARS-CoV-2 infection and should not be used as the sole basis for treatment or other patient management decisions.  A negative result may occur with improper specimen collection / handling, submission of specimen other than nasopharyngeal swab, presence of viral mutation(s) within the areas targeted by this assay, and inadequate number of viral copies (<250 copies / mL). A negative result must be combined with clinical observations, patient history, and epidemiological information.  Fact Sheet for Patients:   StrictlyIdeas.no  Fact Sheet for Healthcare Providers: BankingDealers.co.za  This test is not yet approved or  cleared by the [place] FDA and has been authorized for detection and/or diagnosis of SARS-CoV-2 by FDA under an Emergency Use Authorization (EUA).  This EUA will remain in effect (meaning this test can be used) for the duration of the COVID-19 declaration under Section 564(b)(1) of the Act, 21 U.S.C. section 360bbb-3(b)(1), unless the authorization is terminated or revoked sooner.  Performed at [facility], [place] [address], [place], [place] [address]   MRSA PCR Screening     Status: None   Collection Time: 08/07/19  8:27 PM   Specimen: Nasal Mucosa; Nasopharyngeal  Result Value Ref Range Status   MRSA by PCR NEGATIVE NEGATIVE Final     Comment:        The GeneXpert MRSA Assay (FDA approved for NASAL specimens only), is one component of a comprehensive MRSA colonization surveillance program. It is not intended to diagnose MRSA infection nor to guide or monitor treatment for MRSA infections. Performed at [facility], [place] [address] [place] [address], [place], [place] [address]      Labs: BNP (last 3 results) Recent Labs    07/23/19 1821 08/07/19 1642  BNP 32.7 81.0   Basic Metabolic Panel: Recent Labs  Lab 08/07/19 1121 08/07/19 1642 08/08/19 0534 08/09/19 0119 08/10/19 0145 08/11/19 0052  NA 135  --  136 137 135 134*  K 3.6  --  3.7 4.1 4.0 3.9  CL 99  --  102 105 103 97*  CO2 24  --  25 24 25 26   GLUCOSE 164*  --  158* 146* 120* 142*  BUN 24*  --  21 16 12 13   CREATININE 1.88*  --  1.46* 1.27* 1.08 1.17  CALCIUM 8.4*  --  8.1* 8.6* 8.7* 8.7*  MG  --  1.2* 1.6* 2.2  --   --    Liver Function Tests: Recent Labs  Lab 08/07/19 1817 08/08/19 0534  AST 12* 13*  ALT 8 6  ALKPHOS 76 69  BILITOT 0.5 0.6  PROT 6.4* 5.5*  ALBUMIN 2.9* 2.5*   No results for input(s): LIPASE, AMYLASE in the last 168 hours. No results for input(s): AMMONIA in the last 168 hours. CBC: Recent Labs  Lab 08/07/19 1121 08/08/19 0534 08/09/19 0119 08/11/19 0052  WBC 6.4 6.0 5.0 5.9  HGB 9.6* 8.5* 9.0* 9.6*  HCT 30.9* 26.7* 28.2* 29.1*  MCV 98.4 97.1 97.9 95.4  PLT 170 141* 137* 140*   Cardiac Enzymes: Recent Labs  Lab 08/08/19 1454  CKTOTAL 68   BNP: Invalid input(s): POCBNP CBG: Recent Labs  Lab 08/10/19 1102 08/10/19 1618 08/10/19 2102 08/11/19 0621 08/11/19 1114  GLUCAP 139* 115* 132* 163* 133*   D-Dimer No results for input(s): DDIMER in the last 72 hours. Hgb A1c Recent Labs    08/09/19 0119  HGBA1C 6.8*   Lipid Profile No results for input(s): CHOL, HDL, LDLCALC, TRIG, CHOLHDL, LDLDIRECT in the last 72 hours. Thyroid function studies No results for input(s): TSH, T4TOTAL, T3FREE, THYROIDAB in  the last 72 hours.  Invalid input(s): FREET3 Anemia work up No results for input(s): VITAMINB12, FOLATE, FERRITIN, TIBC, IRON, RETICCTPCT in the last 72 hours. Urinalysis    Component Value Date/Time   COLORURINE YELLOW 08/07/2019 1837   APPEARANCEUR HAZY (A) 08/07/2019 1837   LABSPEC 1.016 08/07/2019 1837   PHURINE 5.0 08/07/2019 1837   GLUCOSEU NEGATIVE 08/07/2019 1837   HGBUR MODERATE (A) 08/07/2019 1837   HGBUR trace-intact 12/29/2006 0827   BILIRUBINUR NEGATIVE 08/07/2019 [place] 08/07/2019 1837   PROTEINUR 100 (A) 08/07/2019 1837   UROBILINOGEN 0.2 12/29/2006 0827   NITRITE NEGATIVE 08/07/2019 1837   LEUKOCYTESUR MODERATE (A) 08/07/2019 1837   Sepsis Labs Invalid input(s): PROCALCITONIN,  WBC,  LACTICIDVEN Microbiology Recent Results (from the past 240 hour(s))  SARS Coronavirus 2 by RT PCR (hospital order, performed in [facility] hospital lab) Nasopharyngeal Nasopharyngeal Swab     Status: None   Collection Time: 08/07/19  4:42 PM   Specimen: Nasopharyngeal Swab  Result Value Ref Range Status   SARS Coronavirus 2 NEGATIVE NEGATIVE Final    Comment: (NOTE) SARS-CoV-2 target nucleic acids are NOT DETECTED.  The SARS-CoV-2 RNA is generally detectable in upper and lower respiratory specimens during the acute phase of infection. The lowest concentration of SARS-CoV-2 viral copies this assay can detect is 250 copies / mL. A negative result does not preclude SARS-CoV-2 infection and should not be used as the sole basis for treatment or other patient management decisions.  A negative result may occur with improper specimen collection / handling, submission of specimen other than nasopharyngeal swab, presence of viral mutation(s) within the areas targeted by this assay, and inadequate number of viral copies (<250 copies / mL). A negative result must be combined with clinical observations, patient history, and epidemiological information.  Fact Sheet for  Patients:   StrictlyIdeas.no  Fact Sheet for Healthcare Providers: BankingDealers.co.za  This test is not yet approved or  cleared by the [place] FDA and has been authorized for detection and/or diagnosis of SARS-CoV-2 by FDA under an Emergency Use Authorization (EUA).  This EUA will remain in effect (meaning this test can be used) for the duration of the COVID-19 declaration under Section 564(b)(1) of the Act, 21  U.S.C. section 360bbb-3(b)(1), unless the authorization is terminated or revoked sooner.  Performed at [facility], [place] [address] [place], [place] [address]   MRSA PCR Screening     Status: None   Collection Time: 08/07/19  8:27 PM   Specimen: Nasal Mucosa; Nasopharyngeal  Result Value Ref Range Status   MRSA by PCR NEGATIVE NEGATIVE Final    Comment:        The GeneXpert MRSA Assay (FDA approved for NASAL specimens only), is one component of a comprehensive MRSA colonization surveillance program. It is not intended to diagnose MRSA infection nor to guide or monitor treatment for MRSA infections. Performed at [facility], [place] [address] [place], [place] [address]      Time coordinating discharge: Over 30 minutes  SIGNED:   Arlett Goold J [place] DO  [facility] 08/11/2019, 2:56 PM

## 2019-08-12 ENCOUNTER — Telehealth: Payer: Self-pay | Admitting: Hematology & Oncology

## 2019-08-12 NOTE — Telephone Encounter (Signed)
Patients son called in to reschedule his 8/6 appts.  He requested to be scheduled out until the last week of August due to his father bing in a rehab center at the present time. Appts scheduled for 8/30 and he was in agreement with both date/time

## 2019-08-13 ENCOUNTER — Non-Acute Institutional Stay (SKILLED_NURSING_FACILITY): Payer: Self-pay | Admitting: Internal Medicine

## 2019-08-13 ENCOUNTER — Inpatient Hospital Stay: Payer: Medicare Other

## 2019-08-13 ENCOUNTER — Encounter: Payer: Self-pay | Admitting: Internal Medicine

## 2019-08-13 ENCOUNTER — Inpatient Hospital Stay: Payer: Medicare Other | Admitting: Hematology & Oncology

## 2019-08-13 DIAGNOSIS — Z9359 Other cystostomy status: Secondary | ICD-10-CM | POA: Insufficient documentation

## 2019-08-13 DIAGNOSIS — Z789 Other specified health status: Secondary | ICD-10-CM

## 2019-08-13 DIAGNOSIS — G4733 Obstructive sleep apnea (adult) (pediatric): Secondary | ICD-10-CM

## 2019-08-13 DIAGNOSIS — I4891 Unspecified atrial fibrillation: Secondary | ICD-10-CM | POA: Insufficient documentation

## 2019-08-13 DIAGNOSIS — E1140 Type 2 diabetes mellitus with diabetic neuropathy, unspecified: Secondary | ICD-10-CM

## 2019-08-13 DIAGNOSIS — N1832 Chronic kidney disease, stage 3b: Secondary | ICD-10-CM

## 2019-08-13 DIAGNOSIS — I480 Paroxysmal atrial fibrillation: Secondary | ICD-10-CM

## 2019-08-13 DIAGNOSIS — R339 Retention of urine, unspecified: Secondary | ICD-10-CM

## 2019-08-13 DIAGNOSIS — D6869 Other thrombophilia: Secondary | ICD-10-CM

## 2019-08-13 DIAGNOSIS — I872 Venous insufficiency (chronic) (peripheral): Secondary | ICD-10-CM

## 2019-08-13 DIAGNOSIS — I471 Supraventricular tachycardia, unspecified: Secondary | ICD-10-CM

## 2019-08-13 DIAGNOSIS — Z8572 Personal history of non-Hodgkin lymphomas: Secondary | ICD-10-CM | POA: Insufficient documentation

## 2019-08-13 NOTE — Progress Notes (Signed)
Provider:  Rexene Yates. Gary Yates, D.O., C.M.D. Location:  [place] Room Number: [place] of Service:  SNF (31)  PCP: Gary Post, MD Patient Care Team: Gary Post, MD as PCP - General (Family Medicine) Gary Blanks, MD as PCP - Cardiology (Cardiology) Gary Blanks, MD as Consulting Physician (Cardiology)  Extended Emergency Contact Information Primary Emergency Contact: Gary Yates,Gary Yates Address: [place], [place] [address] Gary Yates of [place] Phone: [phone] Relation: Spouse Secondary Emergency Contact: Gary Yates, Gary Yates [address] Gary Yates of [place] Phone: [phone] Relation: Son  Code Status: full code Goals of Care: Advanced Directive information Advanced Directives 08/13/2019  Does Patient Have a Medical Advance Directive? Yes  Type of Advance Directive -  Does patient want to make changes to medical advance directive? No - Patient declined  Copy of [place] in Chart? -  Would patient like information on creating a medical advance directive? -  Pre-existing out of facility DNR order (yellow form or pink MOST form) -   Chief Complaint  Patient presents with  . New Admit To SNF    new admits to snf for rehab    HPI: Patient is a 86 y.o. male with h/o non-Hodgkin's lymphoma in remission, paroxysmal afib on coumadin with INR goal 2-3, DMII with CKD3b, hyperglycemia and neuropathy, CAD, htn, hyperlipidemia, GERD, anemia of chronic disease, morbid obesity seen today for admission to [facility] s/p hospitalization from 7/31-08/11/19 at [facility] for increased weakness, fatigue and lower extremity edema .  He had fallen 2 weeks prior and broken left ribs, then twice more soon after that.  He'd seen his primary care office and his lasix was increased due to edema and sob.  His last echo in 2019 showed EF 55-60%.  He was also having  increased spasms and shaking in his legs with struggles bearing weight.  He was found by EMS to be in "a weird tachyarrythmia".  In the ED, he was felt to be in SVT.  BPs were soft in the 80s and HR 170s.  He was given adenosine through cardiology and converted to sinus.  CXR was clear, troponins negative, BNP normal.  His anemia was stable with hgb 9.4.  His creatinine was elevated at 1.88.  Mag was low and repleted.  He continued with significant LE edema but felt NOT to be volume overloaded.  Cardiology felt he may need an antiarrhythmic vs catheter ablation and possibly right heart cath so he was admitted for further eval.  It was determined that if he has considerable symptoms from his SVT, he may need catheter ablation but, for now, he was continued on low dose beta blocker.  He was in NSR at discharge.    He also has h/o paroxysmal afib on coumadin with goal INR 2-3.  Was taking 2.22m mon/wed/fri/sat and 546msun/tues/thurs.  Last INR was 1.9 on 8/4.  Today's is also 1.9.  He is frustrated that he's supposed to eat consistent greens.  Says that alternative blood thinners were too expensive for him when I asked about why he still took coumadin.  Also tells me that the [facility] home health folks check his INRs and change out his suprapubic catheter for him.  Edema was felt to be venous insufficiency with noncompressible ABIs.  He was treated with unnaboots  and continued on lasix 34m daily.  [facility]    Creatinine returned to baseline of 1.2-1.4 (1.17) at discharge.    He remains on atorvastatin 482mdaily for his hyperlipidemia.  CAD:  Had no active chest pain.  DMII WITH HYPERGLYCEMIA, neuropathy, ckd 3b:  On metformin 50047mo bid, gabapentin 600m55m tid. Lab Results  Component Value Date   HGBA1C 6.8 (H) 08/09/2019  Estimated Creatinine Clearance: 56.9 mL/min (by C-G formula based on SCr of 1.17 mg/dL).  GERD:  On protonix  H/o  nonhodgkin's lymphoma:  In remission, last saw Dr. EnneMarin Olp5/21  Anemia of chronic disease is being managed with iron and b12 Lab Results  Component Value Date   HGB 9.6 (L) 08/11/2019   OSA:  Needs outpatient sleep study with CPAP titration per hospital d/c summary.  Morbid obesity:  BMI 35.16.    He had his pfizer covid vaccines 1/19 and 2/8 and a negative covid test 08/07/19.  Lives at home with his wife who has dementia in [employer] uses a pillbox for himself and fills himself.  Children help with wife's pills.  He quit smoking several years ago after he prayed and the lord helped him quit.  She's 7th day adventist.    Past Medical History:  Diagnosis Date  . Anemia in chronic renal disease 05/07/2017  . Anxiety   . Atrial fibrillation (HCC)[place]. COPD (chronic obstructive pulmonary disease) (HCC)[place] pt. denies  . Coronary artery disease    a. h/o Overlapping stents RCA;  b. 06/2011 Cath: patent stents, nonobs dzs, NL EF.  . Diabetic peripheral neuropathy (HCC)[place]. Diffuse non-Hodgkin's lymphoma of testis (HCC)[place]21/2017  . DM (diabetes mellitus) (HCC)[place] Type 2, peripheral neuropathy.  . Dyspnea    with exertion  . Dysrhythmia   . GERD (gastroesophageal reflux disease)   . Headache   . History of bronchitis   . History of kidney stones   . History of radiation therapy 02/19/16 - 03/13/16   Testis/Scrotum: 32.4 Gy in 18 fractions  . History of radiation therapy 08/07/16-08/20/16   left adrenal gland mass treated to 30 Gy in 10 fractions  . Hyperlipidemia   . Hypertension   . Iron deficiency anemia due to chronic blood loss 08/08/2017  . Low testosterone   . Nephrolithiasis   . OSA (obstructive sleep apnea) 11/26/2017  . Osteoarthritis    shoulder  . Restless leg   . SVT (supraventricular tachycardia) (HCC)[place]. Urinary frequency   . Wears partial dentures    upper and lower   Past Surgical History:  Procedure Laterality Date  . APPENDECTOMY    . [place]ARDIAC CATHETERIZATION  01/2013  . CATARACT EXTRACTION, BILATERAL    . CHOLECYSTECTOMY    . COLONOSCOPY    . CORONARY ANGIOPLASTY  2004  . CYSTOSCOPY N/A 08/18/2017   Procedure: CYSTOSCOPY WITH FULGURATION AND SUPRA PUBIC TUBE PLACEMENT;  Surgeon: OtteKathie Rhodes;  Location: [facility] ORS;  Service: Urology;  Laterality: N/A;  . EYE SURGERY Bilateral    cataracts  . IR GENERIC HISTORICAL  10/05/2015   IR US [place] VASC ACCESS RIGHT 10/05/2015 [facility]  . IR GENERIC HISTORICAL  10/05/2015   IR FLUORO GUIDE PORT INSERTION RIGHT 10/05/2015 [facility]belle Kill[facility]  . LEFT HEART  CATHETERIZATION WITH CORONARY ANGIOGRAM N/A 06/18/2011   Procedure: LEFT HEART CATHETERIZATION WITH CORONARY ANGIOGRAM;  Surgeon: Peter M Martinique, MD;  Location: [facility] CATH LAB;  Service: Cardiovascular;  Laterality: N/A;  . LEFT HEART CATHETERIZATION WITH CORONARY ANGIOGRAM N/A 01/27/2013   Procedure: LEFT HEART CATHETERIZATION WITH CORONARY ANGIOGRAM;  Surgeon: Gary Blanks, MD;  Location: [facility] CATH LAB;  Service: Cardiovascular;  Laterality: N/A;  . LUMBAR LAMINECTOMY/DECOMPRESSION MICRODISCECTOMY N/A 02/07/2015   Procedure: Lumbar three-Sacral one Decompression;  Surgeon: Kevan Ny Ditty, MD;  Location: [facility] NEURO ORS;  Service: Neurosurgery;  Laterality: N/A;  L3 to S1 Decompression  . MULTIPLE TOOTH EXTRACTIONS    . ORCHIECTOMY Right 09/01/2015   Procedure: RIGHT ORCHIECTOMY;  Surgeon: Kathie Rhodes, MD;  Location: [facility] ORS;  Service: Urology;  Laterality: Right;  . port a cath in place     . ROTATOR CUFF REPAIR Left     Social History   Socioeconomic History  . Marital status: Married    Spouse name: Not on file  . Number of children: 3  . Years of education: Not on file  . Highest education level: Not on file  Occupational History  . Occupation: Retired    Fish farm manager: RETIRED  Tobacco Use  . Smoking status: Former Smoker    Packs/day: 1.50    Years: 20.00    Pack  years: 30.00    Types: Cigarettes    Quit date: 04/05/1978    Years since quitting: 41.3  . Smokeless tobacco: Never Used  Vaping Use  . Vaping Use: Never used  Substance and Sexual Activity  . Alcohol use: No  . Drug use: No  . Sexual activity: Yes    Birth control/protection: None    Comment: Married  Other Topics Concern  . Not on file  Social History Narrative  . Not on file   Social Determinants of Health   Financial Resource Strain:   . Difficulty of Paying Living Expenses:   Food Insecurity:   . Worried About Charity fundraiser in the Last Year:   . Arboriculturist in the Last Year:   Transportation Needs:   . Film/video editor (Medical):   Marland Kitchen Lack of Transportation (Non-Medical):   Physical Activity:   . Days of Exercise per Week:   . Minutes of Exercise per Session:   Stress:   . Feeling of Stress :   Social Connections:   . Frequency of Communication with Friends and Family:   . Frequency of Social Gatherings with Friends and Family:   . Attends Religious Services:   . Active Member of Clubs or Organizations:   . Attends Archivist Meetings:   Marland Kitchen Marital Status:     reports that he quit smoking about 41 years ago. His smoking use included cigarettes. He has a 30.00 pack-year smoking history. He has never used smokeless tobacco. He reports that he does not drink alcohol and does not use drugs.  Functional Status Survey:    Family History  Problem Relation Age of Onset  . Alzheimer's disease Mother   . Heart disease Mother   . Heart disease Father   . Migraines Father   . Ulcers Father   . Prostate cancer Brother   . Coronary artery disease Other        Male 1st degree relative <50  . Coronary artery disease Other        male 1st degree relative <60  . Heart disease Sister   .  Obesity Sister        Morbid  . Arthritis Sister   . Heart disease Brother   . Arthritis Brother   . Sleep apnea Son   . Obesity Son   . Migraines  Daughter   . Thyroid disease Daughter     Health Maintenance  Topic Date Due  . FOOT EXAM  08/06/2014  . OPHTHALMOLOGY EXAM  07/23/2019  . INFLUENZA VACCINE  08/08/2019  . HEMOGLOBIN A1C  02/09/2020  . TETANUS/TDAP  05/09/2025  . COVID-19 Vaccine  Completed  . PNA vac Low Risk Adult  Completed    Allergies  Allergen Reactions  . Latex Other (See Comments)    Reddens the skin  . Ace Inhibitors Cough  . Codeine Nausea Only and Rash       . Penicillins Rash    Childhood allergy Has patient had a PCN reaction causing immediate rash, facial/tongue/throat swelling, SOB or lightheadedness with hypotension: Yes Has patient had a PCN reaction causing severe rash involving mucus membranes or skin necrosis: Yes Has patient had a PCN reaction that required hospitalization No Has patient had a PCN reaction occurring within the last 10 years: No If all of the above answers are "NO", then may proceed with Cephalosporin use.     Outpatient Encounter Medications as of 08/13/2019  Medication Sig  . acetaminophen (TYLENOL) 500 MG tablet Take 1,000 mg by mouth every 6 (six) hours as needed (for pain/fever/headaches.).   Marland Kitchen albuterol (VENTOLIN HFA) 108 (90 Base) MCG/ACT inhaler Inhale 2 puffs into the lungs every 4 (four) hours as needed for wheezing or shortness of breath.  Marland Kitchen atorvastatin (LIPITOR) 40 MG tablet Take 1 tablet (40 mg total) by mouth daily.  Marland Kitchen docusate sodium (COLACE) 50 MG capsule Take 1 capsule (50 mg total) by mouth 2 (two) times daily as needed (for constipation).  . furosemide (LASIX) 40 MG tablet Take 1 tablet (40 mg total) by mouth daily.  Marland Kitchen gabapentin (NEURONTIN) 300 MG capsule Take 2 capsules (600 mg total) by mouth 3 (three) times daily.  . Melatonin 1 MG TABS Take 1 tablet (1 mg total) by mouth at bedtime.  Marland Kitchen METFORMIN HCL PO Take 500 mg by mouth. 1 tablet by mouth twice daily with meal  . metoprolol succinate (TOPROL-XL) 25 MG 24 hr tablet Take 1 tablet (25 mg total) by  mouth daily.  . nitroGLYCERIN (NITROSTAT) 0.4 MG SL tablet Place 1 tablet (0.4 mg total) under the tongue every 5 (five) minutes as needed. Chest pain  . nystatin (MYCOSTATIN/NYSTOP) powder APPLY TO AFFECTED AREA 4 TIMES A DAY  . nystatin-triamcinolone ointment (MYCOLOG) Apply 1 application topically 2 (two) times daily.  . ondansetron (ZOFRAN ODT) 4 MG disintegrating tablet 53m ODT q4 hours prn nausea/vomit  . pantoprazole (PROTONIX) 40 MG tablet Take 1 tablet (40 mg total) by mouth daily.  . polyethylene glycol (MIRALAX / GLYCOLAX) packet Take 17 g by mouth daily.  . potassium chloride SA (KLOR-CON M20) 20 MEQ tablet Take 1 tablet (20 mEq total) by mouth daily.  . pramipexole (MIRAPEX) 1.5 MG tablet Take 2 tablets (3 mg total) by mouth every evening.  . vitamin B-12 1000 MCG tablet Take 1 tablet (1,000 mcg total) by mouth daily.  .Marland Kitchenwarfarin (COUMADIN) 5 MG tablet Take 1/2 tablet daily except take 1 tablet on Sun Tues and Thurs or Take as directed by anticoagulation clinic  . [DISCONTINUED] glucose blood (ACCU-CHEK AVIVA PLUS) test strip 1 each by Other route 3 (three)  times daily. for testing  . [DISCONTINUED] metFORMIN (GLUCOPHAGE) 500 MG tablet Take 1 tablet (500 mg total) by mouth 2 (two) times daily with a meal.  . [DISCONTINUED] metoprolol (LOPRESSOR) 50 MG tablet   . [DISCONTINUED] spironolactone (ALDACTONE) 25 MG tablet    Facility-Administered Encounter Medications as of 08/13/2019  Medication  . acetaminophen (TYLENOL) tablet 650 mg  . pegfilgrastim (NEULASTA ONPRO KIT) injection 6 mg  . sodium chloride flush (NS) 0.9 % injection 10 mL  . sodium chloride flush (NS) 0.9 % injection 10 mL  . sodium chloride flush (NS) 0.9 % injection 10 mL    Review of Systems  Constitutional: Negative for chills, fever and malaise/fatigue.  HENT: Positive for hearing loss. Negative for congestion and sore throat.   Eyes: Negative for blurred vision.  Respiratory: Negative for cough and shortness  of breath.   Cardiovascular: Positive for leg swelling. Negative for chest pain, palpitations, orthopnea and PND.  Gastrointestinal: Positive for diarrhea. Negative for abdominal pain, blood in stool, constipation and melena.       Had a couple episodes of diarrhea this am and reports he'd not been given any laxatives  Genitourinary: Negative for dysuria.       Suprapubic catheter in place since NHL in testicle--had surgery, radiation, chemo, now 27 mos cancer free  Musculoskeletal: Positive for falls. Negative for joint pain.  Skin: Negative for itching and rash.  Neurological: Positive for dizziness and weakness. Negative for loss of consciousness.  Endo/Heme/Allergies: Bruises/bleeds easily.  Psychiatric/Behavioral: Positive for memory loss. Negative for depression. The patient is not nervous/anxious and does not have insomnia.        Seems to have some mild memory loss    Vitals:   08/13/19 1122  BP: 108/68  Pulse: 65  Temp: 97.8 F (36.6 C)  Weight: 238 lb 1.6 oz (108 kg)  Height: 5' 9"  (1.753 m)   Body mass index is 35.16 kg/m. Physical Exam Vitals reviewed.  Constitutional:      Appearance: Normal appearance. He is obese. He is not ill-appearing or toxic-appearing.  HENT:     Head: Normocephalic and atraumatic.     Right Ear: External ear normal.     Left Ear: External ear normal.     Ears:     Comments: HOH, talks loudly    Nose: Nose normal.     Mouth/Throat:     Pharynx: Oropharynx is clear.  Eyes:     Extraocular Movements: Extraocular movements intact.     Conjunctiva/sclera: Conjunctivae normal.     Pupils: Pupils are equal, round, and reactive to light.     Comments: Closes his eyes when he speaks often  Cardiovascular:     Rate and Rhythm: Normal rate and regular rhythm.     Heart sounds: No murmur heard.   Pulmonary:     Effort: Pulmonary effort is normal.     Breath sounds: Normal breath sounds. No rales.  Abdominal:     General: Bowel sounds are  normal.     Palpations: Abdomen is soft.     Tenderness: There is no abdominal tenderness. There is no guarding or rebound.  Genitourinary:    Comments: Has suprapubic catheter draining light yellow urine Musculoskeletal:        General: Normal range of motion.     Cervical back: Neck supple.     Right lower leg: Edema present.     Left lower leg: Edema present.     Comments: Bilateral feet  and legs are wrapped in unnaboots  Skin:    General: Skin is warm and dry.     Capillary Refill: Capillary refill takes less than 2 seconds.  Neurological:     General: No focal deficit present.     Mental Status: He is alert and oriented to person, place, and time.     Gait: Gait abnormal.     Comments: Uses rollator walker  Psychiatric:        Mood and Affect: Mood normal.        Behavior: Behavior normal.     Labs reviewed: Basic Metabolic Panel: Recent Labs    08/07/19 1121 08/07/19 1642 08/08/19 0534 08/08/19 0534 08/09/19 0119 08/10/19 0145 08/11/19 0052  NA   < >  --  136   < > 137 135 134*  K   < >  --  3.7   < > 4.1 4.0 3.9  CL   < >  --  102   < > 105 103 97*  CO2   < >  --  25   < > 24 25 26   GLUCOSE   < >  --  158*   < > 146* 120* 142*  BUN   < >  --  21   < > 16 12 13   CREATININE   < >  --  1.46*   < > 1.27* 1.08 1.17  CALCIUM   < >  --  8.1*   < > 8.6* 8.7* 8.7*  MG  --  1.2* 1.6*  --  2.2  --   --    < > = values in this interval not displayed.   Liver Function Tests: Recent Labs    07/23/19 1858 08/07/19 1817 08/08/19 0534  AST 13* 12* 13*  ALT 7 8 6   ALKPHOS 92 76 69  BILITOT 0.3 0.5 0.6  PROT 6.9 6.4* 5.5*  ALBUMIN 3.3* 2.9* 2.5*   Recent Labs    07/23/19 1858  LIPASE 30   No results for input(s): AMMONIA in the last 8760 hours. CBC: Recent Labs    12/09/18 1200 12/09/18 1200 04/14/19 0828 04/14/19 0828 07/23/19 1409 07/23/19 1426 08/08/19 0534 08/09/19 0119 08/11/19 0052  WBC 5.6   < > 5.6  --  7.0   < > 6.0 5.0 5.9  NEUTROABS 3.4   --  3.2  --  4.7  --   --   --   --   HGB 9.8*   < > 9.5*  --  9.6*   < > 8.5* 9.0* 9.6*  HCT 29.9*   < > 28.8*   < > 31.2*   < > 26.7* 28.2* 29.1*  MCV 97.4   < > 96.3   < > 98.7   < > 97.1 97.9 95.4  PLT 154   < > 136*  --  189   < > 141* 137* 140*   < > = values in this interval not displayed.   Cardiac Enzymes: Recent Labs    08/08/19 1454  CKTOTAL 68   BNP: Invalid input(s): POCBNP Lab Results  Component Value Date   HGBA1C 6.8 (H) 08/09/2019   Lab Results  Component Value Date   TSH 4.795 (H) 08/05/2018   Lab Results  Component Value Date   VITAMINB12 205 08/08/2019   Lab Results  Component Value Date   FOLATE 7.2 08/08/2019   Lab Results  Component Value Date   IRON 38 (L) 08/08/2019  TIBC 186 (L) 08/08/2019   FERRITIN 470 (H) 08/08/2019    Imaging and Procedures obtained prior to SNF admission: US RENAL  Result Date: 08/07/2019 CLINICAL DATA:  Acute on chronic renal failure EXAM: RENAL / URINARY TRACT ULTRASOUND COMPLETE COMPARISON:  Jun 06, 2017 FINDINGS: Right Kidney: Renal measurements: 11.6 x 5.2 x 6.1 cm = volume: 192 mL . Echogenicity within normal limits. Mild diffuse cortical thinning is seen. There are 2 anechoic cystic lesions seen within the right kidney the largest within the upper pole measuring 4.1 x 3.5 x 3.5 cm with internal septations. Left Kidney: Renal measurements: 12.2 x 5.4 x 6.4 cm = volume: 219 mL. Echogenicity within normal limits. Diffuse cortical thinning is noted. There is an anechoic cyst seen within the left kidney measuring 4.4 x 3.9 x 4.7 cm. Bladder: Decompressed with a suprapubic catheter. Other: None. IMPRESSION: Bilateral simple renal cysts. No hydronephrosis or other acute abnormality Electronically Signed   By: Prudencio Pair M.D.   On: 08/07/2019 22:04   DG Chest Portable 1 View  Result Date: 08/07/2019 CLINICAL DATA:  Shortness of breath, weakness and tremors EXAM: PORTABLE CHEST 1 VIEW COMPARISON:  Radiograph 07/12/2019, CT  07/23/2019 FINDINGS: Nondisplaced lateral eighth rib fracture, possibly acute to subacute with history of recent fall. No other acute or suspicious osseous or soft tissue abnormality. Degenerative changes are present in the imaged spine and shoulders. Atelectatic changes are present in the left lung base with more chronic elevation of the left hemidiaphragm similar to priors. Chronically coarsened interstitial changes are similar to comparison exams. No consolidation, features of edema, pneumothorax, or effusion. Stable cardiomediastinal contours with a borderline enlarged cardiac silhouette and a calcified, tortuous aorta. Right IJ approach Port-A-Cath tip terminates in the lower SVC. Telemetry leads and pacer pads overlie the chest. IMPRESSION: 1. Nondisplaced lateral eighth rib fracture, possibly acute to subacute with history of recent fall. 2. Chronically coarsened interstitial changes and left basilar atelectasis. 3. No other acute cardiopulmonary abnormalities. 4. Stable cardiomegaly. 5.  Aortic Atherosclerosis (ICD10-I70.0). Electronically Signed   By: Lovena Le M.D.   On: 08/07/2019 16:43   ECHOCARDIOGRAM COMPLETE  Result Date: 08/08/2019    ECHOCARDIOGRAM REPORT   Patient Name:   Gary Yates. Date of Exam: 08/08/2019 Medical Rec #:  [id number]         Height:       69.0 in Accession #:    [id number]        Weight:       230.4 lb Date of Birth:  11-14-1933         BSA:          2.194 m Patient Age:    86 years          BP:           99/49 mmHg Patient Gender: M                 HR:           69 bpm. Exam Location:  Inpatient Procedure: 2D Echo Indications:    palpitations 785.1  History:        Patient has prior history of Echocardiogram examinations, most                 recent 06/10/2017. CAD, Arrythmias:paroxysmal afib,                 Signs/Symptoms:lower extremity edema; Risk Factors:Hypertension,  Dyslipidemia, Sleep Apnea and Diabetes.  Sonographer:    Johny Chess Referring  Phys: [place]  1. Left ventricular ejection fraction, by estimation, is 60 to 65%. The left ventricle has normal function. The left ventricle has no regional wall motion abnormalities. Left ventricular diastolic parameters are consistent with Grade I diastolic dysfunction (impaired relaxation).  2. Right ventricular systolic function is normal. The right ventricular size is normal. There is normal pulmonary artery systolic pressure.  3. The mitral valve is normal in structure. No evidence of mitral valve regurgitation. No evidence of mitral stenosis.  4. The aortic valve is normal in structure. Aortic valve regurgitation is not visualized. Mild to moderate aortic valve sclerosis/calcification is present, without any evidence of aortic stenosis.  5. The inferior vena cava is normal in size with greater than 50% respiratory variability, suggesting right atrial pressure of 3 mmHg. FINDINGS  Left Ventricle: Left ventricular ejection fraction, by estimation, is 60 to 65%. The left ventricle has normal function. The left ventricle has no regional wall motion abnormalities. The left ventricular internal cavity size was normal in size. There is  no left ventricular hypertrophy. Left ventricular diastolic parameters are consistent with Grade I diastolic dysfunction (impaired relaxation). Right Ventricle: The right ventricular size is normal. No increase in right ventricular wall thickness. Right ventricular systolic function is normal. There is normal pulmonary artery systolic pressure. The tricuspid regurgitant velocity is 2.02 m/s, and  with an assumed right atrial pressure of 3 mmHg, the estimated right ventricular systolic pressure is 76.1 mmHg. Left Atrium: Left atrial size was normal in size. Right Atrium: Right atrial size was normal in size. Pericardium: There is no evidence of pericardial effusion. Mitral Valve: The mitral valve is normal in structure. Normal mobility of the mitral valve  leaflets. Mild mitral annular calcification. No evidence of mitral valve regurgitation. No evidence of mitral valve stenosis. Tricuspid Valve: The tricuspid valve is normal in structure. Tricuspid valve regurgitation is not demonstrated. No evidence of tricuspid stenosis. Aortic Valve: The aortic valve is normal in structure. Aortic valve regurgitation is not visualized. Mild to moderate aortic valve sclerosis/calcification is present, without any evidence of aortic stenosis. Aortic valve mean gradient measures 10.0 mmHg.  Aortic valve peak gradient measures 17.7 mmHg. Aortic valve area, by VTI measures 1.42 cm. Pulmonic Valve: The pulmonic valve was normal in structure. Pulmonic valve regurgitation is not visualized. No evidence of pulmonic stenosis. Aorta: The aortic root is normal in size and structure. Venous: The inferior vena cava is normal in size with greater than 50% respiratory variability, suggesting right atrial pressure of 3 mmHg. IAS/Shunts: No atrial level shunt detected by color flow Doppler.  LEFT VENTRICLE PLAX 2D LVIDd:         5.10 cm  Diastology LVIDs:         3.20 cm  LV e' lateral:   8.59 cm/s LV PW:         1.10 cm  LV E/e' lateral: 11.0 LV IVS:        1.00 cm  LV e' medial:    7.29 cm/s LVOT diam:     2.10 cm  LV E/e' medial:  12.9 LV SV:         62 LV SV Index:   28 LVOT Area:     3.46 cm  RIGHT VENTRICLE             IVC RV S prime:     12.90 cm/s  IVC  diam: 2.30 cm TAPSE (M-mode): 2.0 cm LEFT ATRIUM             Index       RIGHT ATRIUM           Index LA diam:        2.90 cm 1.32 cm/m  RA Area:     14.40 cm LA Vol (A2C):   52.7 ml 24.02 ml/m RA Volume:   31.90 ml  14.54 ml/m LA Vol (A4C):   42.9 ml 19.55 ml/m LA Biplane Vol: 49.7 ml 22.65 ml/m  AORTIC VALVE AV Area (Vmax):    1.46 cm AV Area (Vmean):   1.33 cm AV Area (VTI):     1.42 cm AV Vmax:           210.50 cm/s AV Vmean:          150.000 cm/s AV VTI:            0.436 m AV Peak Grad:      17.7 mmHg AV Mean Grad:      10.0  mmHg LVOT Vmax:         88.55 cm/s LVOT Vmean:        57.550 cm/s LVOT VTI:          0.179 m LVOT/AV VTI ratio: 0.41  AORTA Ao Asc diam: 3.10 cm MITRAL VALVE                TRICUSPID VALVE MV Area (PHT): 2.83 cm     TR Peak grad:   16.3 mmHg MV Decel Time: 268 msec     TR Vmax:        202.00 cm/s MV E velocity: 94.30 cm/s MV A velocity: 119.00 cm/s  SHUNTS MV E/A ratio:  0.79         Systemic VTI:  0.18 m                             Systemic Diam: 2.10 cm Candee Furbish MD Electronically signed by Candee Furbish MD Signature Date/Time: 08/08/2019/11:52:00 AM    Final     Assessment/Plan 1. SVT (supraventricular tachycardia) ([place]) -notes indicate that if he has this recur and is symptomatic, he may need catheter ablation in the future -I see he also has a h/o VT on record and he's had afib so this will be challenging to detect a difference in SNF when only means is with mobile EKG company -if he gets dizzy/hot feeling, we should check his pulse -he should f/u with Dr. Angelena Form outpatient   2. Chronic venous insufficiency of lower extremity -continue with his unnaboots here -he is urinating quite a bit and weight has gone down from 232 at hospital admission to 221 lbs here today (3 lbs just since yesterday) -he is making nice pale yellow urine -I wonder about lymphedema in him with his h/o NHL of groin area with radiation in the past--d/c summary recommends wound care center f/u for his swelling, but he has no wounds so seems like maybe outpatient rehab lymphedema clinic may be more appropriate for him  3. OSA (obstructive sleep apnea) -would need outpatient sleep study and titration  4. Type 2 diabetes mellitus with diabetic neuropathy, without long-term current use of insulin (HCC) -cont home metformin -check CBGs weekly here  5. Stage 3b chronic kidney disease -Avoid nephrotoxic agents like nsaids, dose adjust renally excreted meds, hydrate. -seems this was from his lymphoma  which caused retention  and some renal concerns at one time  6. Paroxysmal atrial fibrillation (HCC) -recently in SVT and NSR, on low dose beta blocker -on warfarin  7. Hypercoagulable state due to atrial fibrillation (HCC) -warfarin goal INR 2-3 -was 1.9 at d/c and 1.9 today so will give 250m today, then resume normal dosing of 2.536mmon, wed, fri, sat and 50m52mun, tues, thurs -recheck INR in 1 wk  8. History of non-Hodgkin's lymphoma -s/p right orchiectomy, later chemo and radiation -now in remission for 27 mos  9. Urinary retention -due to #8 by his report -continue suprapubic catheter, will need to resume care with [facility] out of [place] he is d/c'd home  10. [place] who also checks his INRs  11. Full code status - Full code--discussed today considering his h/o VT and his recent "weird" tachyarrhythmia and brief hot, lightheadedness times at home -he reports that he has relatives who lived years after being put on machines for acute situations (his father apparently required a ventilation and another male relative) so he wants to be full code -16 mins spent on ACP  Family/ staff Communication: discussed with SNF nurse  Labs/tests ordered:  Has f/u cbc, bmp  Jaanai Salemi L. Eldred Sooy, D.O. [facility] [address] [id number] Phone (Mon-Fri 8am-5pm):  [phone] Call:  [phone] prompts after 5pm & weekends Office Phone:  [phone] Fax:  [phone]

## 2019-08-18 LAB — CBC AND DIFFERENTIAL
HCT: 31 — AB (ref 41–53)
Hemoglobin: 10.3 — AB (ref 13.5–17.5)
Platelets: 144 — AB (ref 150–399)
WBC: 5.5

## 2019-08-18 LAB — BASIC METABOLIC PANEL
BUN: 29 — AB (ref 4–21)
CO2: 23 — AB (ref 13–22)
Chloride: 100 (ref 99–108)
Creatinine: 1.5 — AB (ref 0.6–1.3)
Glucose: 122
Potassium: 3.9 (ref 3.4–5.3)
Sodium: 137 (ref 137–147)

## 2019-08-18 LAB — COMPREHENSIVE METABOLIC PANEL
Calcium: 9 (ref 8.7–10.7)
GFR calc Af Amer: 47.88
GFR calc non Af Amer: 41.31

## 2019-08-18 LAB — CBC: RBC: 3.24 — AB (ref 3.87–5.11)

## 2019-08-23 ENCOUNTER — Non-Acute Institutional Stay (SKILLED_NURSING_FACILITY): Payer: Medicare Other | Admitting: Family

## 2019-08-23 ENCOUNTER — Encounter: Payer: Self-pay | Admitting: Family

## 2019-08-23 DIAGNOSIS — I480 Paroxysmal atrial fibrillation: Secondary | ICD-10-CM

## 2019-08-23 DIAGNOSIS — R2681 Unsteadiness on feet: Secondary | ICD-10-CM | POA: Diagnosis not present

## 2019-08-23 DIAGNOSIS — G4733 Obstructive sleep apnea (adult) (pediatric): Secondary | ICD-10-CM | POA: Diagnosis not present

## 2019-08-23 DIAGNOSIS — J449 Chronic obstructive pulmonary disease, unspecified: Secondary | ICD-10-CM | POA: Insufficient documentation

## 2019-08-23 DIAGNOSIS — N1832 Chronic kidney disease, stage 3b: Secondary | ICD-10-CM

## 2019-08-23 DIAGNOSIS — E1140 Type 2 diabetes mellitus with diabetic neuropathy, unspecified: Secondary | ICD-10-CM

## 2019-08-23 DIAGNOSIS — I872 Venous insufficiency (chronic) (peripheral): Secondary | ICD-10-CM

## 2019-08-23 DIAGNOSIS — Z9359 Other cystostomy status: Secondary | ICD-10-CM

## 2019-08-23 DIAGNOSIS — K219 Gastro-esophageal reflux disease without esophagitis: Secondary | ICD-10-CM

## 2019-08-23 DIAGNOSIS — J439 Emphysema, unspecified: Secondary | ICD-10-CM

## 2019-08-23 DIAGNOSIS — I471 Supraventricular tachycardia: Secondary | ICD-10-CM

## 2019-08-23 MED ORDER — NYSTATIN-TRIAMCINOLONE 100000-0.1 UNIT/GM-% EX OINT: 1.0000 "application " | TOPICAL_OINTMENT | Freq: Two times a day (BID) | CUTANEOUS | 0 refills | Status: DC

## 2019-08-23 MED ORDER — ALBUTEROL SULFATE HFA 108 (90 BASE) MCG/ACT IN AERS: 2.0000 | INHALATION_SPRAY | RESPIRATORY_TRACT | 0 refills | Status: DC | PRN

## 2019-08-23 MED ORDER — POTASSIUM CHLORIDE CRYS ER 20 MEQ PO TBCR: 20.0000 meq | EXTENDED_RELEASE_TABLET | Freq: Every day | ORAL | 0 refills | Status: DC

## 2019-08-23 MED ORDER — METOPROLOL SUCCINATE ER 25 MG PO TB24: 25.0000 mg | ORAL_TABLET | Freq: Every day | ORAL | 0 refills | Status: DC

## 2019-08-23 MED ORDER — ONDANSETRON 4 MG PO TBDP: ORAL_TABLET | ORAL | 0 refills | Status: DC

## 2019-08-23 MED ORDER — NYSTATIN 100000 UNIT/GM EX POWD: CUTANEOUS | 0 refills | Status: DC

## 2019-08-23 MED ORDER — ATORVASTATIN CALCIUM 40 MG PO TABS: 40.0000 mg | ORAL_TABLET | Freq: Every day | ORAL | 0 refills | Status: DC

## 2019-08-23 MED ORDER — PANTOPRAZOLE SODIUM 40 MG PO TBEC: 40.0000 mg | DELAYED_RELEASE_TABLET | Freq: Every day | ORAL | 0 refills | Status: DC

## 2019-08-23 MED ORDER — WARFARIN SODIUM 5 MG PO TABS: 5.0000 mg | ORAL_TABLET | Freq: Every day | ORAL | 0 refills | Status: DC

## 2019-08-23 MED ORDER — PRAMIPEXOLE DIHYDROCHLORIDE 1.5 MG PO TABS: 3.0000 mg | ORAL_TABLET | Freq: Every evening | ORAL | 0 refills | Status: DC

## 2019-08-23 MED ORDER — GABAPENTIN 300 MG PO CAPS: 600.0000 mg | ORAL_CAPSULE | Freq: Three times a day (TID) | ORAL | 0 refills | Status: DC

## 2019-08-23 MED ORDER — WARFARIN SODIUM 2.5 MG PO TABS: 2.5000 mg | ORAL_TABLET | ORAL | 0 refills | Status: DC

## 2019-08-23 MED ORDER — FUROSEMIDE 40 MG PO TABS: 40.0000 mg | ORAL_TABLET | Freq: Every day | ORAL | 0 refills | Status: DC

## 2019-08-23 NOTE — Progress Notes (Signed)
Location:  [facility] Room Number: 104-P Place of Service:  SNF (31)  Provider: Marlowe Sax FNP-C   PCP: [name], MD Patient Care Team: [name], MD as PCP - General (Family Medicine) Burnell Blanks, MD as PCP - Cardiology (Cardiology) Burnell Blanks, MD as Consulting Physician (Cardiology)  Extended Emergency Contact Information Primary Emergency Contact: Hensley,Betty Address: [place], [place] [address] [name] of [place] Phone: [phone] Relation: Spouse Secondary Emergency Contact: Velvet Bathe, [place] [address] [name] of [place] Phone: [phone] Relation: Son  Code Status: DNR Goals of care:  Advanced Directive information Advanced Directives 08/23/2019  Does Patient Have a Medical Advance Directive? Yes  Type of Advance Directive -  Does patient want to make changes to medical advance directive? No - Patient declined  Copy of [place] in Chart? -  Would patient like information on creating a medical advance directive? -  Pre-existing out of facility DNR order (yellow form or pink MOST form) -     Allergies  Allergen Reactions  . Latex Other (See Comments)    Reddens the skin  . Ace Inhibitors Cough  . Codeine Nausea Only and Rash       . Penicillins Rash    Childhood allergy Has patient had a PCN reaction causing immediate rash, facial/tongue/throat swelling, SOB or lightheadedness with hypotension: Yes Has patient had a PCN reaction causing severe rash involving mucus membranes or skin necrosis: Yes Has patient had a PCN reaction that required hospitalization No Has patient had a PCN reaction occurring within the last 10 years: No If all of the above answers are "NO", then may proceed with Cephalosporin use.     Chief Complaint  Patient presents with  . Discharge Note    Discharge from SNF    HPI:  86  y.o. male seen today at [facility] living and rehabilitation for discharge home.He has a medical history of Hypertension,paroxysmal Arterial fibrillation on coumadin,CAD,Type 2 Diabetes Mellitus,COPD,Obstructive sleep Apnea,CKD stage 3 b,GERD,Peripheral Neuropathy,Hx of non - Hodgkin's Lymphoma in remission among other conditions.He is seen in his room sitting with legs elevated on recliner.   He was here for short term rehabilitation for post hospital admission from 08/07/2019 - 08/11/2019 for increased weakness and lower extremities edema.His was seen by his PCP and his lasix was increased due to leg swelling and shortness of breath.He had increased spasm and shaking in his legs and struggled to bear weight.EMS noted weird tachyarrhythmia.In the ED he had soft B/P and high heart rate in the 170's thought to be in SVT.He was treated with adenosine converted to sinus.Cardiology felt may need an antiarrhythmic vs catheterization.Cardiology recommended low dose beta blocker and consider catheter ablation if he has considerable symptoms of SVT.His troponin,and Bnp were normal.CXR was clear.Mg was low 1.6 was replete.Hgb was stable at 9.4 His lower extremities edema persist without any signs of fluid overload.Edema was thought to be venous insufficiency with noncompressible ABI's He was treated with Unna boots and continued on lasix 40 mg daily.Referral to wound center for management when discharged from SNF recommended. Though he was seen here at Rehab by Dr.Reed who thought Outpatient rehab lymphedema clinic would be beneficial since he does not  have any wounds on the legs.   He has worked well with PT/OT now stable for  discharge home.He will be discharged home with [facility] PT/OT to continue with ROM, Exercise, Gait stability and muscle strengthening.   He will also discharge with [facility] for suprapubic catheter monthly care and weekly unna boots dressing change.Home health  Nurse also to check INR. Latest INR was 1.6 (08/20/2019) goal 2-3.0 for Afib his coumadin was adjusted by Dr.Reed currently on coumadin 5 mg tablet daily and 2.5 mg tablet on Wednesday.Next INR to be checked by [facility] Nurse 08/27/2019.   He requires DME Rollator to allow her to maintain current level of independence with ADL's.Rollator was ordered and Blue delivered to patient's room on 08/20/2019.   Home health services will be arranged by facility social worker prior to discharge. Prescription medication will be written x 1 month then patient to follow up with PCP in 1-2 weeks.He request [facility] to [facility] Telephone # [phone]. He denies any acute issues this visit. Facility staff report no new concerns.  Past Medical History:  Diagnosis Date  . Anemia in chronic renal disease 05/07/2017  . Anxiety   . Atrial fibrillation ([place])   . COPD (chronic obstructive pulmonary disease) ([place])    pt. denies  . Coronary artery disease    a. h/o Overlapping stents RCA;  b. 06/2011 Cath: patent stents, nonobs dzs, NL EF.  . Diabetic peripheral neuropathy ([place])   . Diffuse non-Hodgkin's lymphoma of testis ([place]) 09/28/2015  . DM (diabetes mellitus) ([place])    Type 2, peripheral neuropathy.  . Dyspnea    with exertion  . Dysrhythmia   . GERD (gastroesophageal reflux disease)   . Headache   . History of bronchitis   . History of kidney stones   . History of radiation therapy 02/19/16 - 03/13/16   Testis/Scrotum: 32.4 Gy in 18 fractions  . History of radiation therapy 08/07/16-08/20/16   left adrenal gland mass treated to 30 Gy in 10 fractions  . Hyperlipidemia   . Hypertension   . Iron deficiency anemia due to chronic blood loss 08/08/2017  . Low testosterone   . Nephrolithiasis   . OSA (obstructive sleep apnea) 11/26/2017  . Osteoarthritis    shoulder  . Restless leg   . SVT (supraventricular tachycardia) ([place])   . Urinary frequency   . Wears partial  dentures    upper and lower    Past Surgical History:  Procedure Laterality Date  . APPENDECTOMY    . [place]  . CARDIAC CATHETERIZATION  01/2013  . CATARACT EXTRACTION, BILATERAL    . CHOLECYSTECTOMY    . COLONOSCOPY    . CORONARY ANGIOPLASTY  2004  . CYSTOSCOPY N/A 08/18/2017   Procedure: CYSTOSCOPY WITH FULGURATION AND SUPRA PUBIC TUBE PLACEMENT;  Surgeon: Kathie Rhodes, MD;  Location: [facility] ORS;  Service: Urology;  Laterality: N/A;  . EYE SURGERY Bilateral    cataracts  . IR GENERIC HISTORICAL  10/05/2015   IR US GUIDE VASC ACCESS RIGHT 10/05/2015 Marybelle Killings, MD [facility]  . IR GENERIC HISTORICAL  10/05/2015   IR FLUORO GUIDE PORT INSERTION RIGHT 10/05/2015 Marybelle Killings, MD [facility]  . LEFT HEART CATHETERIZATION WITH CORONARY ANGIOGRAM N/A 06/18/2011   Procedure: LEFT HEART CATHETERIZATION WITH CORONARY ANGIOGRAM;  Surgeon: Peter M Martinique, MD;  Location: [facility] CATH LAB;  Service: Cardiovascular;  Laterality: N/A;  . LEFT HEART CATHETERIZATION WITH CORONARY ANGIOGRAM N/A 01/27/2013   Procedure: LEFT HEART CATHETERIZATION WITH CORONARY ANGIOGRAM;  Surgeon:  Burnell Blanks, MD;  Location: [facility] CATH LAB;  Service: Cardiovascular;  Laterality: N/A;  . LUMBAR LAMINECTOMY/DECOMPRESSION MICRODISCECTOMY N/A 02/07/2015   Procedure: Lumbar three-Sacral one Decompression;  Surgeon: Kevan Ny Ditty, MD;  Location: [facility] NEURO ORS;  Service: Neurosurgery;  Laterality: N/A;  L3 to S1 Decompression  . MULTIPLE TOOTH EXTRACTIONS    . ORCHIECTOMY Right 09/01/2015   Procedure: RIGHT ORCHIECTOMY;  Surgeon: Kathie Rhodes, MD;  Location: [facility]S;  Service: Urology;  Laterality: Right;  . port a cath in place     . ROTATOR CUFF REPAIR Left       reports that he quit smoking about 41 years ago. His smoking use included cigarettes. He has a 30.00 pack-year smoking history. He has never used smokeless tobacco. He reports that he does not drink alcohol and does not use drugs. Social  History   Socioeconomic History  . Marital status: Married    Spouse name: Not on file  . Number of children: 3  . Years of education: Not on file  . Highest education level: Not on file  Occupational History  . Occupation: Retired    Fish farm manager: RETIRED  Tobacco Use  . Smoking status: Former Smoker    Packs/day: 1.50    Years: 20.00    Pack years: 30.00    Types: Cigarettes    Quit date: 04/05/1978    Years since quitting: 41.4  . Smokeless tobacco: Never Used  Vaping Use  . Vaping Use: Never used  Substance and Sexual Activity  . Alcohol use: No  . Drug use: No  . Sexual activity: Yes    Birth control/protection: None    Comment: Married  Other Topics Concern  . Not on file  Social History Narrative  . Not on file   Social Determinants of Health   Financial Resource Strain:   . Difficulty of Paying Living Expenses:   Food Insecurity:   . Worried About Charity fundraiser in the Last Year:   . Arboriculturist in the Last Year:   Transportation Needs:   . Film/video editor (Medical):   Marland Kitchen Lack of Transportation (Non-Medical):   Physical Activity:   . Days of Exercise per Week:   . Minutes of Exercise per Session:   Stress:   . Feeling of Stress :   Social Connections:   . Frequency of Communication with Friends and Family:   . Frequency of Social Gatherings with Friends and Family:   . Attends Religious Services:   . Active Member of Clubs or Organizations:   . Attends Archivist Meetings:   Marland Kitchen Marital Status:   Intimate Partner Violence:   . Fear of Current or Ex-Partner:   . Emotionally Abused:   Marland Kitchen Physically Abused:   . Sexually Abused:     Allergies  Allergen Reactions  . Latex Other (See Comments)    Reddens the skin  . Ace Inhibitors Cough  . Codeine Nausea Only and Rash       . Penicillins Rash    Childhood allergy Has patient had a PCN reaction causing immediate rash, facial/tongue/throat swelling, SOB or lightheadedness with  hypotension: Yes Has patient had a PCN reaction causing severe rash involving mucus membranes or skin necrosis: Yes Has patient had a PCN reaction that required hospitalization No Has patient had a PCN reaction occurring within the last 10 years: No If all of the above answers are "NO", then may proceed with Cephalosporin use.  Pertinent  Health Maintenance Due  Topic Date Due  . FOOT EXAM  08/06/2014  . OPHTHALMOLOGY EXAM  07/23/2019  . INFLUENZA VACCINE  08/08/2019  . HEMOGLOBIN A1C  02/09/2020  . PNA vac Low Risk Adult  Completed    Medications: Outpatient Encounter Medications as of 08/23/2019  Medication Sig  . acetaminophen (TYLENOL) 500 MG tablet Take 1,000 mg by mouth every 6 (six) hours as needed (for pain/fever/headaches.).   Marland Kitchen albuterol (VENTOLIN HFA) 108 (90 Base) MCG/ACT inhaler Inhale 2 puffs into the lungs every 4 (four) hours as needed for wheezing or shortness of breath.  Marland Kitchen atorvastatin (LIPITOR) 40 MG tablet Take 1 tablet (40 mg total) by mouth daily.  Marland Kitchen docusate sodium (COLACE) 50 MG capsule Take 1 capsule (50 mg total) by mouth 2 (two) times daily as needed (for constipation).  . furosemide (LASIX) 40 MG tablet Take 1 tablet (40 mg total) by mouth daily.  Marland Kitchen gabapentin (NEURONTIN) 300 MG capsule Take 2 capsules (600 mg total) by mouth 3 (three) times daily.  . Melatonin 1 MG TABS Take 1 tablet (1 mg total) by mouth at bedtime.  Marland Kitchen METFORMIN HCL PO Take 500 mg by mouth. 1 tablet by mouth twice daily with meal  . metoprolol succinate (TOPROL-XL) 25 MG 24 hr tablet Take 1 tablet (25 mg total) by mouth daily.  . nitroGLYCERIN (NITROSTAT) 0.4 MG SL tablet Place 1 tablet (0.4 mg total) under the tongue every 5 (five) minutes as needed. Chest pain  . nystatin (MYCOSTATIN/NYSTOP) powder APPLY TO AFFECTED AREA 4 TIMES A DAY  . nystatin-triamcinolone ointment (MYCOLOG) Apply 1 application topically 2 (two) times daily.  . ondansetron (ZOFRAN ODT) 4 MG disintegrating tablet  4mg  ODT q4 hours prn nausea/vomit  . pantoprazole (PROTONIX) 40 MG tablet Take 1 tablet (40 mg total) by mouth daily.  . polyethylene glycol (MIRALAX / GLYCOLAX) packet Take 17 g by mouth daily.  . potassium chloride SA (KLOR-CON M20) 20 MEQ tablet Take 1 tablet (20 mEq total) by mouth daily.  . pramipexole (MIRAPEX) 1.5 MG tablet Take 2 tablets (3 mg total) by mouth every evening.  . vitamin B-12 1000 MCG tablet Take 1 tablet (1,000 mcg total) by mouth daily.  Marland Kitchen warfarin (COUMADIN) 2.5 MG tablet Take 2.5 mg by mouth once a week. On Wednesdays.  Marland Kitchen warfarin (COUMADIN) 5 MG tablet Take 5 mg by mouth daily.  . [DISCONTINUED] metoprolol (LOPRESSOR) 50 MG tablet   . [DISCONTINUED] spironolactone (ALDACTONE) 25 MG tablet   . [DISCONTINUED] warfarin (COUMADIN) 5 MG tablet Take 1/2 tablet daily except take 1 tablet on Sun Tues and Thurs or Take as directed by anticoagulation clinic   No facility-administered encounter medications on file as of 08/23/2019.     Review of Systems  Constitutional: Negative for appetite change, chills, fatigue and fever.  HENT: Negative for congestion, rhinorrhea, sinus pressure, sinus pain, sneezing, sore throat and trouble swallowing.   Eyes: Negative for discharge, redness, itching and visual disturbance.  Respiratory: Negative for cough, chest tightness and wheezing.        Shortness of breath with exertion has improved   Cardiovascular: Positive for leg swelling. Negative for chest pain and palpitations.  Gastrointestinal: Negative for abdominal distention, abdominal pain, constipation, diarrhea, nausea and vomiting.  Endocrine: Negative for cold intolerance, heat intolerance, polydipsia, polyphagia and polyuria.  Genitourinary: Negative for flank pain and urgency.       Suprapubic catheter   Musculoskeletal: Positive for gait problem. Negative for joint swelling  and myalgias.  Skin: Negative for color change, pallor, rash and wound.  Neurological: Negative for  dizziness, tremors, speech difficulty, weakness, light-headedness and headaches.  Hematological: Does not bruise/bleed easily.  Psychiatric/Behavioral: Negative for agitation, confusion and sleep disturbance. The patient is not nervous/anxious.     Vitals:   08/23/19 0929  BP: 121/71  Pulse: 77  Resp: 18  Temp: 97.6 F (36.4 C)  Weight: 238 lb (108 kg)  Height: 5\' 9"  (1.753 m)   Body mass index is 35.15 kg/m. Physical Exam Vitals reviewed.  Constitutional:      General: He is not in acute distress.    Appearance: He is obese. He is not ill-appearing.  HENT:     Head: Normocephalic.     Nose: Nose normal. No congestion or rhinorrhea.     Mouth/Throat:     Mouth: Mucous membranes are moist.     Pharynx: Oropharynx is clear. No oropharyngeal exudate or posterior oropharyngeal erythema.  Eyes:     General: No scleral icterus.       Right eye: No discharge.        Left eye: No discharge.     Extraocular Movements: Extraocular movements intact.     Conjunctiva/sclera: Conjunctivae normal.     Pupils: Pupils are equal, round, and reactive to light.  Neck:     Vascular: No carotid bruit.  Cardiovascular:     Rate and Rhythm: Normal rate and regular rhythm.     Pulses: Normal pulses.     Heart sounds: Normal heart sounds. No murmur heard.  No friction rub. No gallop.   Pulmonary:     Effort: Pulmonary effort is normal. No respiratory distress.     Breath sounds: Normal breath sounds. No wheezing, rhonchi or rales.  Chest:     Chest wall: No tenderness.  Abdominal:     General: Bowel sounds are normal. There is no distension.     Palpations: Abdomen is soft. There is no mass.     Tenderness: There is no abdominal tenderness. There is no right CVA tenderness, left CVA tenderness, guarding or rebound.  Musculoskeletal:        General: No swelling or tenderness.     Cervical back: Normal range of motion. No rigidity or tenderness.     Comments: Unsteady gait ambulates with  Rolator.Bilateral lower extremities unna boots in place dressing clean,dry and intact.  Lymphadenopathy:     Cervical: No cervical adenopathy.  Skin:    General: Skin is warm.     Coloration: Skin is not pale.     Findings: No bruising, erythema or rash.  Neurological:     Mental Status: He is alert and oriented to person, place, and time.     Cranial Nerves: No cranial nerve deficit.     Motor: No weakness.     Coordination: Coordination normal.     Gait: Gait abnormal.  Psychiatric:        Mood and Affect: Mood normal.        Behavior: Behavior normal.        Thought Content: Thought content normal.        Judgment: Judgment normal.    Labs reviewed: Basic Metabolic Panel: Recent Labs    08/07/19 1121 08/07/19 1642 08/08/19 0534 08/08/19 0534 08/09/19 0119 08/09/19 0119 08/10/19 0145 08/11/19 0052 08/18/19 0000  NA   < >  --  136   < > 137   < > 135 134* 137  K   < >  --  3.7   < > 4.1   < > 4.0 3.9 3.9  CL   < >  --  102   < > 105   < > 103 97* 100  CO2   < >  --  25   < > 24   < > 25 26 23*  GLUCOSE   < >  --  158*   < > 146*  --  120* 142*  --   BUN   < >  --  21   < > 16   < > 12 13 29*  CREATININE   < >  --  1.46*   < > 1.27*   < > 1.08 1.17 1.5*  CALCIUM   < >  --  8.1*   < > 8.6*   < > 8.7* 8.7* 9.0  MG  --  1.2* 1.6*  --  2.2  --   --   --   --    < > = values in this interval not displayed.   Liver Function Tests: Recent Labs    07/23/19 1858 08/07/19 1817 08/08/19 0534  AST 13* 12* 13*  ALT 7 8 6   ALKPHOS 92 76 69  BILITOT 0.3 0.5 0.6  PROT 6.9 6.4* 5.5*  ALBUMIN 3.3* 2.9* 2.5*   Recent Labs    07/23/19 1858  LIPASE 30   CBC: Recent Labs    12/09/18 1200 12/09/18 1200 04/14/19 0828 04/14/19 0828 07/23/19 1409 07/23/19 1426 08/08/19 0534 08/08/19 0534 08/09/19 0119 08/11/19 0052 08/18/19 0000  WBC 5.6   < > 5.6  --  7.0   < > 6.0   < > 5.0 5.9 5.5  NEUTROABS 3.4  --  3.2  --  4.7  --   --   --   --   --   --   HGB 9.8*   < >  9.5*  --  9.6*   < > 8.5*   < > 9.0* 9.6* 10.3*  HCT 29.9*   < > 28.8*   < > 31.2*   < > 26.7*   < > 28.2* 29.1* 31*  MCV 97.4   < > 96.3   < > 98.7   < > 97.1  --  97.9 95.4  --   PLT 154   < > 136*  --  189   < > 141*   < > 137* 140* 144*   < > = values in this interval not displayed.   Cardiac Enzymes: Recent Labs    08/08/19 1454  CKTOTAL 68   CBG: Recent Labs    08/11/19 0621 08/11/19 1114 08/11/19 1553  GLUCAP 163* 133* 166*    Procedures and Imaging Studies During Stay: US RENAL  Result Date: 08/07/2019 CLINICAL DATA:  Acute on chronic renal failure EXAM: RENAL / URINARY TRACT ULTRASOUND COMPLETE COMPARISON:  Jun 06, 2017 FINDINGS: Right Kidney: Renal measurements: 11.6 x 5.2 x 6.1 cm = volume: 192 mL . Echogenicity within normal limits. Mild diffuse cortical thinning is seen. There are 2 anechoic cystic lesions seen within the right kidney the largest within the upper pole measuring 4.1 x 3.5 x 3.5 cm with internal septations. Left Kidney: Renal measurements: 12.2 x 5.4 x 6.4 cm = volume: 219 mL. Echogenicity within normal limits. Diffuse cortical thinning is noted. There is an anechoic cyst seen within the left kidney measuring 4.4 x 3.9 x 4.7 cm. Bladder: Decompressed with a suprapubic catheter. Other:  None. IMPRESSION: Bilateral simple renal cysts. No hydronephrosis or other acute abnormality Electronically Signed   By: Prudencio Pair M.D.   On: 08/07/2019 22:04   DG Chest Portable 1 View  Result Date: 08/07/2019 CLINICAL DATA:  Shortness of breath, weakness and tremors EXAM: PORTABLE CHEST 1 VIEW COMPARISON:  Radiograph 07/12/2019, CT 07/23/2019 FINDINGS: Nondisplaced lateral eighth rib fracture, possibly acute to subacute with history of recent fall. No other acute or suspicious osseous or soft tissue abnormality. Degenerative changes are present in the imaged spine and shoulders. Atelectatic changes are present in the left lung base with more chronic elevation of the left  hemidiaphragm similar to priors. Chronically coarsened interstitial changes are similar to comparison exams. No consolidation, features of edema, pneumothorax, or effusion. Stable cardiomediastinal contours with a borderline enlarged cardiac silhouette and a calcified, tortuous aorta. Right IJ approach Port-A-Cath tip terminates in the lower SVC. Telemetry leads and pacer pads overlie the chest. IMPRESSION: 1. Nondisplaced lateral eighth rib fracture, possibly acute to subacute with history of recent fall. 2. Chronically coarsened interstitial changes and left basilar atelectasis. 3. No other acute cardiopulmonary abnormalities. 4. Stable cardiomegaly. 5.  Aortic Atherosclerosis (ICD10-I70.0). Electronically Signed   By: Lovena Le M.D.   On: 08/07/2019 16:43   VAS [place] ABI WITH/WO TBI  Result Date: 08/09/2019 LOWER EXTREMITY DOPPLER STUDY Indications: Significant edema, provider would like to place Unna boots in              patient with diabetes. High Risk         Hypertension, hyperlipidemia, Diabetes, coronary artery Factors:          disease.  Comparison Study: Prior study from 09/28/15 is available for comparison Performing Technologist: Sharion Dove RVS  Examination Guidelines: A complete evaluation includes at minimum, Doppler waveform signals and systolic blood pressure reading at the level of bilateral brachial, anterior tibial, and posterior tibial arteries, when vessel segments are accessible. Bilateral testing is considered an integral part of a complete examination. Photoelectric Plethysmograph (PPG) waveforms and toe systolic pressure readings are included as required and additional duplex testing as needed. Limited examinations for reoccurring indications may be performed as noted.  ABI Findings: +---------+------------------+-----+---------+--------+ Right    Rt Pressure (mmHg)IndexWaveform Comment  +---------+------------------+-----+---------+--------+ Brachial 127                     triphasic         +---------+------------------+-----+---------+--------+ PTA      194               1.52 biphasic          +---------+------------------+-----+---------+--------+ DP       254               1.98 biphasic          +---------+------------------+-----+---------+--------+ Great Toe127               0.99                   +---------+------------------+-----+---------+--------+ +---------+------------------+-----+---------+-------+ Left     Lt Pressure (mmHg)IndexWaveform Comment +---------+------------------+-----+---------+-------+ Brachial 128                    triphasic        +---------+------------------+-----+---------+-------+ PTA      181               1.41 biphasic         +---------+------------------+-----+---------+-------+ DP  254               1.98 biphasic         +---------+------------------+-----+---------+-------+ Great Toe148               1.16                  +---------+------------------+-----+---------+-------+ +-------+-----------+-----------+------------+------------+ ABI/TBIToday's ABIToday's TBIPrevious ABIPrevious TBI +-------+-----------+-----------+------------+------------+ Right  1.9        0.99       1.2                      +-------+-----------+-----------+------------+------------+ Left   1.9        1.16       1.2                      +-------+-----------+-----------+------------+------------+ Arterial wall calcification precludes accurate ankle pressures and ABIs. Right ABIs appear increased. Left ABIs appear increased. Vessels appear calcified with normal waveforms.  Summary: Right: Resting right ankle-brachial index indicates noncompressible right lower extremity arteries. The right toe-brachial index is normal. TBIs are unreliable. ABIs are unreliable. Vessels appear calcified with normal waveforms. Left: Resting left ankle-brachial index indicates noncompressible left lower extremity  arteries. The left toe-brachial index is normal. TBIs are unreliable. ABIs are unreliable.  *See table(s) above for measurements and observations.  Electronically signed by Ruta Hinds MD on 08/09/2019 at 3:48:05 PM.    Final    ECHOCARDIOGRAM COMPLETE  Result Date: 08/08/2019    ECHOCARDIOGRAM REPORT   Patient Name:   Jaelynn Currier. Date of Exam: 08/08/2019 Medical Rec #:  [id number]         Height:       69.0 in Accession #:    [id number]        Weight:       230.4 lb Date of Birth:  05-Sep-1933         BSA:          2.194 m Patient Age:    54 years          BP:           99/49 mmHg Patient Gender: M                 HR:           69 bpm. Exam Location:  Inpatient Procedure: 2D Echo Indications:    palpitations 785.1  History:        Patient has prior history of Echocardiogram examinations, most                 recent 06/10/2017. CAD, Arrythmias:paroxysmal afib,                 Signs/Symptoms:lower extremity edema; Risk Factors:Hypertension,                 Dyslipidemia, Sleep Apnea and Diabetes.  Sonographer:    Johny Chess Referring Phys: [place]  1. Left ventricular ejection fraction, by estimation, is 60 to 65%. The left ventricle has normal function. The left ventricle has no regional wall motion abnormalities. Left ventricular diastolic parameters are consistent with Grade I diastolic dysfunction (impaired relaxation).  2. Right ventricular systolic function is normal. The right ventricular size is normal. There is normal pulmonary artery systolic pressure.  3. The mitral valve is normal in structure. No evidence of mitral valve regurgitation. No evidence of mitral stenosis.  4. The  aortic valve is normal in structure. Aortic valve regurgitation is not visualized. Mild to moderate aortic valve sclerosis/calcification is present, without any evidence of aortic stenosis.  5. The inferior vena cava is normal in size with greater than 50% respiratory variability, suggesting right  atrial pressure of 3 mmHg. FINDINGS  Left Ventricle: Left ventricular ejection fraction, by estimation, is 60 to 65%. The left ventricle has normal function. The left ventricle has no regional wall motion abnormalities. The left ventricular internal cavity size was normal in size. There is  no left ventricular hypertrophy. Left ventricular diastolic parameters are consistent with Grade I diastolic dysfunction (impaired relaxation). Right Ventricle: The right ventricular size is normal. No increase in right ventricular wall thickness. Right ventricular systolic function is normal. There is normal pulmonary artery systolic pressure. The tricuspid regurgitant velocity is 2.02 m/s, and  with an assumed right atrial pressure of 3 mmHg, the estimated right ventricular systolic pressure is 53.6 mmHg. Left Atrium: Left atrial size was normal in size. Right Atrium: Right atrial size was normal in size. Pericardium: There is no evidence of pericardial effusion. Mitral Valve: The mitral valve is normal in structure. Normal mobility of the mitral valve leaflets. Mild mitral annular calcification. No evidence of mitral valve regurgitation. No evidence of mitral valve stenosis. Tricuspid Valve: The tricuspid valve is normal in structure. Tricuspid valve regurgitation is not demonstrated. No evidence of tricuspid stenosis. Aortic Valve: The aortic valve is normal in structure. Aortic valve regurgitation is not visualized. Mild to moderate aortic valve sclerosis/calcification is present, without any evidence of aortic stenosis. Aortic valve mean gradient measures 10.0 mmHg.  Aortic valve peak gradient measures 17.7 mmHg. Aortic valve area, by VTI measures 1.42 cm. Pulmonic Valve: The pulmonic valve was normal in structure. Pulmonic valve regurgitation is not visualized. No evidence of pulmonic stenosis. Aorta: The aortic root is normal in size and structure. Venous: The inferior vena cava is normal in size with greater than 50%  respiratory variability, suggesting right atrial pressure of 3 mmHg. IAS/Shunts: No atrial level shunt detected by color flow Doppler.  LEFT VENTRICLE PLAX 2D LVIDd:         5.10 cm  Diastology LVIDs:         3.20 cm  LV e' lateral:   8.59 cm/s LV PW:         1.10 cm  LV E/e' lateral: 11.0 LV IVS:        1.00 cm  LV e' medial:    7.29 cm/s LVOT diam:     2.10 cm  LV E/e' medial:  12.9 LV SV:         62 LV SV Index:   28 LVOT Area:     3.46 cm  RIGHT VENTRICLE             IVC RV S prime:     12.90 cm/s  IVC diam: 2.30 cm TAPSE (M-mode): 2.0 cm LEFT ATRIUM             Index       RIGHT ATRIUM           Index LA diam:        2.90 cm 1.32 cm/m  RA Area:     14.40 cm LA Vol (A2C):   52.7 ml 24.02 ml/m RA Volume:   31.90 ml  14.54 ml/m LA Vol (A4C):   42.9 ml 19.55 ml/m LA Biplane Vol: 49.7 ml 22.65 ml/m  AORTIC VALVE AV Area (Vmax):  1.46 cm AV Area (Vmean):   1.33 cm AV Area (VTI):     1.42 cm AV Vmax:           210.50 cm/s AV Vmean:          150.000 cm/s AV VTI:            0.436 m AV Peak Grad:      17.7 mmHg AV Mean Grad:      10.0 mmHg LVOT Vmax:         88.55 cm/s LVOT Vmean:        57.550 cm/s LVOT VTI:          0.179 m LVOT/AV VTI ratio: 0.41  AORTA Ao Asc diam: 3.10 cm MITRAL VALVE                TRICUSPID VALVE MV Area (PHT): 2.83 cm     TR Peak grad:   16.3 mmHg MV Decel Time: 268 msec     TR Vmax:        202.00 cm/s MV E velocity: 94.30 cm/s MV A velocity: 119.00 cm/s  SHUNTS MV E/A ratio:  0.79         Systemic VTI:  0.18 m                             Systemic Diam: 2.10 cm Candee Furbish MD Electronically signed by Candee Furbish MD Signature Date/Time: 08/08/2019/11:52:00 AM    Final     Assessment/Plan:    1. Unsteady gait  Has worked well with PT/ OT. He will discharge home PT/OT to continue with ROM, Exercise, Gait stability and muscle strengthening. DME Rolator already ordered and Blue delivered to patient's room on 08/20/2019 to allow her to maintain current level of independence with ADL's.  Fall and safety precautions.   2. Paroxysmal atrial fibrillation [facility]) S/p hospital admission for SVT.NSR on lo low dose Beta blocker Continue on warfarin latest INR 1.6 ( 08/20/2019) coumadin increased to 5 mg tablet daily except 2.5 mg tablet on Wednesday recheck INR by Home health Nurse INR check due 08/27/2019.  3. Type 2 diabetes mellitus with diabetic neuropathy, without long-term current use of insulin (HCC) No Hgb A1C for review. CBG 160's. - continue on metformin  - CBG weekly  4. OSA (obstructive sleep apnea) Refer to outpatient sleep study and titration   5. Chronic venous insufficiency of lower extremity Hospital recommended follow up with wound center but has no wounds.Dr.Reed here at Rehab recommends outpatient rehab lymphedema clinic given history of NHL of groin area with radiation. - continue furosemide 40 mg tablet daily    6. Suprapubic catheter ([place]) -  HH RN for suprapubic Foley Cathter care management   7. Gastroesophageal reflux disease without esophagitis Asymptomatic.  8. Stage 3b chronic kidney disease - since he had lymphoma which caused retention and renal concerns. - continue to avoid nephrotoxins and dose all other medication for renal clearance.  9. SVT (supraventricular tachycardia) ([place]) If recurrence and is symptomatic may need cathter ablation in the future  -continue to monitor for dizziness or feeling hot  - follow up with Dr.[name] outpatient as directed   Patient is being discharged with the following home health services:   -PT/OT for ROM, exercise, gait stability and muscle strengthening  -  HH RN for suprapubic Foley Cathter care management ; Unna boots dressing change weekly and INR check due 08/27/2019.   Patient  is being discharged with the following durable medical equipment:    Rolator already ordered and blue delivered to patient's room on 08/20/2019 to allow  her to maintain current level of independence.  Patient has been  advised to f/u with their PCP in 1-2 weeks to for a transitions of care visit.Social services at their facility was responsible for arranging this appointment.  Pt was provided with adequate prescriptions of noncontrolled medications to reach the scheduled appointment.For controlled substances, a limited supply was provided as appropriate for the individual patient. If the pt normally receives these medications from a pain clinic or has a contract with another physician, these medications should be received from that clinic or physician only).    Future labs/tests needed:  CBC, BMP in 1-2 weeks PCP INR check due 08/27/2019 to be done [facility]

## 2019-08-25 ENCOUNTER — Ambulatory Visit (INDEPENDENT_AMBULATORY_CARE_PROVIDER_SITE_OTHER): Payer: Medicare Other | Admitting: General Practice

## 2019-08-25 DIAGNOSIS — I480 Paroxysmal atrial fibrillation: Secondary | ICD-10-CM

## 2019-08-25 LAB — POCT INR: INR: 2.1 (ref 2.0–3.0)

## 2019-08-25 NOTE — Progress Notes (Signed)
I have reviewed and agree with note, evaluation, plan.   Lanore Renderos, MD  

## 2019-08-25 NOTE — Patient Instructions (Signed)
Pre visit review using our clinic review tool, if applicable. No additional management support is needed unless otherwise documented below in the visit note.  Continue to take  5 mg daily except 2.5 mg on Mon, Wed and Fridays and Saturdays.  Re-check in 2 weeks.  Dosing instructions Jerrilyn Cairo,  RN @ [facility] while in patient's home. [phone].  Dosing instructions also given to patient.

## 2019-08-27 ENCOUNTER — Other Ambulatory Visit: Payer: Self-pay

## 2019-08-27 ENCOUNTER — Ambulatory Visit: Payer: Medicare Other | Admitting: Family Medicine

## 2019-08-27 ENCOUNTER — Encounter: Payer: Self-pay | Admitting: Family Medicine

## 2019-08-27 VITALS — BP 128/62 | HR 92 | Temp 98.4°F | Wt 223.1 lb

## 2019-08-27 DIAGNOSIS — R5383 Other fatigue: Secondary | ICD-10-CM

## 2019-08-27 DIAGNOSIS — I878 Other specified disorders of veins: Secondary | ICD-10-CM | POA: Diagnosis not present

## 2019-08-27 DIAGNOSIS — E1140 Type 2 diabetes mellitus with diabetic neuropathy, unspecified: Secondary | ICD-10-CM

## 2019-08-27 DIAGNOSIS — Z794 Long term (current) use of insulin: Secondary | ICD-10-CM

## 2019-08-27 DIAGNOSIS — I10 Essential (primary) hypertension: Secondary | ICD-10-CM

## 2019-08-27 DIAGNOSIS — R600 Localized edema: Secondary | ICD-10-CM | POA: Diagnosis not present

## 2019-08-27 LAB — BASIC METABOLIC PANEL
BUN/Creatinine Ratio: 17 (calc) (ref 6–22)
BUN: 33 mg/dL — ABNORMAL HIGH (ref 7–25)
CO2: 27 mmol/L (ref 20–32)
Calcium: 9.1 mg/dL (ref 8.6–10.3)
Chloride: 103 mmol/L (ref 98–110)
Creat: 1.89 mg/dL — ABNORMAL HIGH (ref 0.70–1.11)
Glucose, Bld: 138 mg/dL — ABNORMAL HIGH (ref 65–99)
Potassium: 4.5 mmol/L (ref 3.5–5.3)
Sodium: 139 mmol/L (ref 135–146)

## 2019-08-27 NOTE — Patient Instructions (Signed)
We will set up referral to evaluate for obstructive sleep apnea    Continue with Lasix 40 mg daily  Continue with compression hose.

## 2019-08-27 NOTE — Progress Notes (Signed)
Established Patient Office Visit  Subjective:  Patient ID: Gary Dunaj., male    DOB: 12/29/1933  Age: 86 y.o. MRN: [id number]  CC:  Chief Complaint  Patient presents with  . Follow-up    from rehab discharge     HPI Gary Doig. presents for recent hospital and rehab follow-up.  He had been battling some peripheral edema issues for some time.  He was admitted to the hospital on 31 July and discharged on fourth after increasing peripheral edema and increasing weakness.  This had followed a fall with some left-sided rib fractures.  We had increased his Lasix dose prior to admission which did not seem to make much difference.  On the day of his admission he was found to be in a tachyarrhythmia by EMS.  Patient had evidence for supraventricular tachycardia and was seen in consultation by cardiology.  It was not clear that he had any symptoms related to the SVT and he was maintained on low-dose beta-blocker with plan for watchful waiting.  He has chronic atrial fibrillation and is maintained on Coumadin.  He will be getting [facility]up [facility].  His lower extremity edema did improve and was felt to be most likely on the basis of venous stasis.  There are no signs of infection.  He is using compression hose daily.  No skin wounds.  He had acute renal failure on chronic kidney disease stage IIIb.  Creatinine 1.88 on presentation and at discharge 1.17  He has diabetes and had A1c of 6.8%.  He was discharged to [facility] and came home Monday.  There was recommendation to consider obstructive sleep apnea work-up.  We actually had referred him for that (sleep study) back in 2017 prior to diagnosis of cancer and he never went back for work-up.  He is willing to consider evaluation at this time.  Past Medical History:  Diagnosis Date  . Anemia in chronic renal disease 05/07/2017  . Anxiety   . Atrial fibrillation ([place])   . COPD (chronic obstructive pulmonary  disease) ([place])    pt. denies  . Coronary artery disease    a. h/o Overlapping stents RCA;  b. 06/2011 Cath: patent stents, nonobs dzs, NL EF.  . Diabetic peripheral neuropathy ([place])   . Diffuse non-Hodgkin's lymphoma of testis ([place]) 09/28/2015  . DM (diabetes mellitus) ([place])    Type 2, peripheral neuropathy.  . Dyspnea    with exertion  . Dysrhythmia   . GERD (gastroesophageal reflux disease)   . Headache   . History of bronchitis   . History of kidney stones   . History of radiation therapy 02/19/16 - 03/13/16   Testis/Scrotum: 32.4 Gy in 18 fractions  . History of radiation therapy 08/07/16-08/20/16   left adrenal gland mass treated to 30 Gy in 10 fractions  . Hyperlipidemia   . Hypertension   . Iron deficiency anemia due to chronic blood loss 08/08/2017  . Low testosterone   . Nephrolithiasis   . OSA (obstructive sleep apnea) 11/26/2017  . Osteoarthritis    shoulder  . Restless leg   . SVT (supraventricular tachycardia) ([place])   . Urinary frequency   . Wears partial dentures    upper and lower    Past Surgical History:  Procedure Laterality Date  . APPENDECTOMY    . [place]  . CARDIAC CATHETERIZATION  01/2013  . CATARACT EXTRACTION, BILATERAL    . CHOLECYSTECTOMY    .  COLONOSCOPY    . CORONARY ANGIOPLASTY  2004  . CYSTOSCOPY N/A 08/18/2017   Procedure: CYSTOSCOPY WITH FULGURATION AND SUPRA PUBIC TUBE PLACEMENT;  Surgeon: Kathie Rhodes, MD;  Location: [facility];  Service: Urology;  Laterality: N/A;  . EYE SURGERY Bilateral    cataracts  . IR GENERIC HISTORICAL  10/05/2015   IR US GUIDE VASC ACCESS RIGHT 10/05/2015 Marybelle Killings, MD [facility]  . IR GENERIC HISTORICAL  10/05/2015   IR FLUORO GUIDE PORT INSERTION RIGHT 10/05/2015 Marybelle Killings, MD [facility]  . LEFT HEART CATHETERIZATION WITH CORONARY ANGIOGRAM N/A 06/18/2011   Procedure: LEFT HEART CATHETERIZATION WITH CORONARY ANGIOGRAM;  Surgeon: Peter M Martinique, MD;  Location: [facility] CATH LAB;  Service:  Cardiovascular;  Laterality: N/A;  . LEFT HEART CATHETERIZATION WITH CORONARY ANGIOGRAM N/A 01/27/2013   Procedure: LEFT HEART CATHETERIZATION WITH CORONARY ANGIOGRAM;  Surgeon: Burnell Blanks, MD;  Location: [facility] CATH LAB;  Service: Cardiovascular;  Laterality: N/A;  . LUMBAR LAMINECTOMY/DECOMPRESSION MICRODISCECTOMY N/A 02/07/2015   Procedure: Lumbar three-Sacral one Decompression;  Surgeon: Kevan Ny Ditty, MD;  Location: [facility] NEURO ORS;  Service: Neurosurgery;  Laterality: N/A;  L3 to S1 Decompression  . MULTIPLE TOOTH EXTRACTIONS    . ORCHIECTOMY Right 09/01/2015   Procedure: RIGHT ORCHIECTOMY;  Surgeon: Kathie Rhodes, MD;  Location: [facility];  Service: Urology;  Laterality: Right;  . port a cath in place     . ROTATOR CUFF REPAIR Left     Family History  Problem Relation Age of Onset  . Alzheimer's disease Mother   . Heart disease Mother   . Heart disease Father   . Migraines Father   . Ulcers Father   . Prostate cancer Brother   . Coronary artery disease Other        Male 1st degree relative <50  . Coronary artery disease Other        male 1st degree relative <60  . Heart disease Sister   . Obesity Sister        Morbid  . Arthritis Sister   . Heart disease Brother   . Arthritis Brother   . Sleep apnea Son   . Obesity Son   . Migraines Daughter   . Thyroid disease Daughter     Social History   Socioeconomic History  . Marital status: Married    Spouse name: Not on file  . Number of children: 3  . Years of education: Not on file  . Highest education level: Not on file  Occupational History  . Occupation: Retired    Fish farm manager: RETIRED  Tobacco Use  . Smoking status: Former Smoker    Packs/day: 1.50    Years: 20.00    Pack years: 30.00    Types: Cigarettes    Quit date: 04/05/1978    Years since quitting: 41.4  . Smokeless tobacco: Never Used  Vaping Use  . Vaping Use: Never used  Substance and Sexual Activity  . Alcohol use: No  . Drug use: No  .  Sexual activity: Yes    Birth control/protection: None    Comment: Married  Other Topics Concern  . Not on file  Social History Narrative  . Not on file   Social Determinants of Health   Financial Resource Strain:   . Difficulty of Paying Living Expenses: Not on file  Food Insecurity:   . Worried About Charity fundraiser in the Last Year: Not on file  . Ran Out of Food in the Last  Year: Not on file  Transportation Needs:   . Lack of Transportation (Medical): Not on file  . Lack of Transportation (Non-Medical): Not on file  Physical Activity:   . Days of Exercise per Week: Not on file  . Minutes of Exercise per Session: Not on file  Stress:   . Feeling of Stress : Not on file  Social Connections:   . Frequency of Communication with Friends and Family: Not on file  . Frequency of Social Gatherings with Friends and Family: Not on file  . Attends Religious Services: Not on file  . Active Member of Clubs or Organizations: Not on file  . Attends Archivist Meetings: Not on file  . Marital Status: Not on file  Intimate Partner Violence:   . Fear of Current or Ex-Partner: Not on file  . Emotionally Abused: Not on file  . Physically Abused: Not on file  . Sexually Abused: Not on file    Outpatient Medications Prior to Visit  Medication Sig Dispense Refill  . acetaminophen (TYLENOL) 500 MG tablet Take 1,000 mg by mouth every 6 (six) hours as needed (for pain/fever/headaches.).     Marland Kitchen albuterol (VENTOLIN HFA) 108 (90 Base) MCG/ACT inhaler Inhale 2 puffs into the lungs every 4 (four) hours as needed for wheezing or shortness of breath. 18 g 0  . atorvastatin (LIPITOR) 40 MG tablet Take 1 tablet (40 mg total) by mouth daily. 30 tablet 0  . docusate sodium (COLACE) 50 MG capsule Take 1 capsule (50 mg total) by mouth 2 (two) times daily as needed (for constipation). 90 capsule 0  . furosemide (LASIX) 40 MG tablet Take 1 tablet (40 mg total) by mouth daily. 30 tablet 0  .  gabapentin (NEURONTIN) 300 MG capsule Take 2 capsules (600 mg total) by mouth 3 (three) times daily. 180 capsule 0  . Melatonin 1 MG TABS Take 1 tablet (1 mg total) by mouth at bedtime. 30 tablet 2  . METFORMIN HCL PO Take 500 mg by mouth. 1 tablet by mouth twice daily with meal    . metoprolol succinate (TOPROL-XL) 25 MG 24 hr tablet Take 1 tablet (25 mg total) by mouth daily. 30 tablet 0  . nitroGLYCERIN (NITROSTAT) 0.4 MG SL tablet Place 1 tablet (0.4 mg total) under the tongue every 5 (five) minutes as needed. Chest pain 25 tablet 6  . nystatin (MYCOSTATIN/NYSTOP) powder APPLY TO AFFECTED AREA 4 TIMES A DAY 60 g 0  . nystatin-triamcinolone ointment (MYCOLOG) Apply 1 application topically 2 (two) times daily. 60 g 0  . ondansetron (ZOFRAN ODT) 4 MG disintegrating tablet 4m ODT q4 hours prn nausea/vomit 20 tablet 0  . pantoprazole (PROTONIX) 40 MG tablet Take 1 tablet (40 mg total) by mouth daily. 30 tablet 0  . polyethylene glycol (MIRALAX / GLYCOLAX) packet Take 17 g by mouth daily. 14 each 0  . potassium chloride SA (KLOR-CON M20) 20 MEQ tablet Take 1 tablet (20 mEq total) by mouth daily. 30 tablet 0  . pramipexole (MIRAPEX) 1.5 MG tablet Take 2 tablets (3 mg total) by mouth every evening. 60 tablet 0  . vitamin B-12 1000 MCG tablet Take 1 tablet (1,000 mcg total) by mouth daily. 30 tablet 0  . warfarin (COUMADIN) 2.5 MG tablet Take 1 tablet (2.5 mg total) by mouth once a week. On Wednesdays. 4 tablet 0  . warfarin (COUMADIN) 5 MG tablet Take 1 tablet (5 mg total) by mouth daily. 30 tablet 0   No facility-administered  medications prior to visit.    Allergies  Allergen Reactions  . Latex Other (See Comments)    Reddens the skin  . Ace Inhibitors Cough  . Codeine Nausea Only and Rash       . Penicillins Rash    Childhood allergy Has patient had a PCN reaction causing immediate rash, facial/tongue/throat swelling, SOB or lightheadedness with hypotension: Yes Has patient had a PCN  reaction causing severe rash involving mucus membranes or skin necrosis: Yes Has patient had a PCN reaction that required hospitalization No Has patient had a PCN reaction occurring within the last 10 years: No If all of the above answers are "NO", then may proceed with Cephalosporin use.     ROS Review of Systems  Constitutional: Positive for fatigue. Negative for appetite change, chills and fever.  Respiratory: Negative for cough.   Cardiovascular: Negative for chest pain.  Gastrointestinal: Negative for abdominal pain.  Psychiatric/Behavioral: Positive for sleep disturbance. Negative for confusion.      Objective:    Physical Exam Vitals reviewed.  Constitutional:      Appearance: He is obese.  Cardiovascular:     Rate and Rhythm: Normal rate.  Pulmonary:     Effort: Pulmonary effort is normal.     Breath sounds: Normal breath sounds.  Musculoskeletal:     Comments: Knee high compression hose on bilaterally.   Neurological:     Mental Status: He is alert.     BP 128/62 (BP Location: Left Arm, Patient Position: Sitting, Cuff Size: Normal)   Pulse 92   Temp 98.4 F (36.9 C) (Oral)   Wt 223 lb 1.6 oz (101.2 kg)   SpO2 95%   BMI 32.95 kg/m  Wt Readings from Last 3 Encounters:  08/27/19 223 lb 1.6 oz (101.2 kg)  08/23/19 238 lb (108 kg)  08/13/19 238 lb 1.6 oz (108 kg)     Health Maintenance Due  Topic Date Due  . FOOT EXAM  08/06/2014  . OPHTHALMOLOGY EXAM  07/23/2019  . INFLUENZA VACCINE  08/08/2019    There are no preventive care reminders to display for this patient.  Lab Results  Component Value Date   TSH 4.795 (H) 08/05/2018   Lab Results  Component Value Date   WBC 5.5 08/18/2019   HGB 10.3 (A) 08/18/2019   HCT 31 (A) 08/18/2019   MCV 95.4 08/11/2019   PLT 144 (A) 08/18/2019   Lab Results  Component Value Date   NA 137 08/18/2019   K 3.9 08/18/2019   CHLORIDE 104 12/14/2015   CO2 23 (A) 08/18/2019   GLUCOSE 142 (H) 08/11/2019   BUN  29 (A) 08/18/2019   CREATININE 1.5 (A) 08/18/2019   BILITOT 0.6 08/08/2019   ALKPHOS 69 08/08/2019   AST 13 (L) 08/08/2019   ALT 6 08/08/2019   PROT 5.5 (L) 08/08/2019   ALBUMIN 2.5 (L) 08/08/2019   CALCIUM 9.0 08/18/2019   ANIONGAP 11 08/11/2019   EGFR 76 (L) 12/14/2015   GFR 38.06 (L) 03/22/2019   Lab Results  Component Value Date   CHOL 166 03/22/2019   Lab Results  Component Value Date   HDL 29.80 (L) 03/22/2019   Lab Results  Component Value Date   LDLCALC 53 09/15/2017   Lab Results  Component Value Date   TRIG 373.0 (H) 03/22/2019   Lab Results  Component Value Date   CHOLHDL 6 03/22/2019   Lab Results  Component Value Date   HGBA1C 6.8 (H) 08/09/2019  Assessment & Plan:   #1 bilateral leg edema currently stable.  He has compression hose on bilaterally. -Continue furosemide 40 mg daily -Follow closely for any skin wounds  #2 chronic atrial fibrillation maintained on Coumadin and metoprolol.  Rate controlled today. -We will be getting follow-up INRs through [facility]  #3 chronic fatigue.  Likely multifactorial.  Rule out obstructive sleep apnea. -Referral for further evaluation  #4 type 2 diabetes controlled with A1c 6.8% -Did discuss with family cautions regarding Metformin with chronic kidney disease.  His last creatinine that was 1.17 and we elected to watch and observe for now.  If creatinine starts to climb consider going back to once daily long-acting insulin.  No orders of the defined types were placed in this encounter.   Follow-up: Return in about 3 months (around 11/27/2019).    Carolann Littler, MD

## 2019-08-31 ENCOUNTER — Other Ambulatory Visit: Payer: Self-pay

## 2019-08-31 DIAGNOSIS — R600 Localized edema: Secondary | ICD-10-CM

## 2019-09-01 ENCOUNTER — Ambulatory Visit: Payer: Medicare Other | Admitting: Family Medicine

## 2019-09-06 ENCOUNTER — Inpatient Hospital Stay: Payer: Medicare Other

## 2019-09-06 ENCOUNTER — Inpatient Hospital Stay: Payer: Medicare Other | Admitting: Hematology & Oncology

## 2019-09-09 ENCOUNTER — Telehealth: Payer: Self-pay | Admitting: Family Medicine

## 2019-09-09 ENCOUNTER — Ambulatory Visit (INDEPENDENT_AMBULATORY_CARE_PROVIDER_SITE_OTHER): Payer: Medicare Other | Admitting: General Practice

## 2019-09-09 DIAGNOSIS — Z7901 Long term (current) use of anticoagulants: Secondary | ICD-10-CM

## 2019-09-09 LAB — POCT INR: INR: 2.8 (ref 2.0–3.0)

## 2019-09-09 NOTE — Patient Instructions (Signed)
Pre visit review using our clinic review tool, if applicable. No additional management support is needed unless otherwise documented below in the visit note.   Continue to take  5 mg daily except 2.5 mg on Mon, Wed and Fridays and Saturdays.  Re-check in 2 weeks.  Dosing instructions Gary Yates @ [facility] while in patient's home. [phone]. Dosing instructions also given to patient.

## 2019-09-09 NOTE — Telephone Encounter (Signed)
Kathlee Nations from [facility] called to report pt's INR to [place]. INR 2.8   She has sent a form to get him a compression pump to an Organization called [employer] and they will be contacting [name] for that  Kathlee Nations can be reached at [phone]

## 2019-09-09 NOTE — Progress Notes (Signed)
Medical screening examination/treatment/procedure(s) were performed by non-physician practitioner and as supervising physician I was immediately available for consultation/collaboration. I agree with above. Layna Roeper, MD   

## 2019-09-14 ENCOUNTER — Other Ambulatory Visit: Payer: Self-pay | Admitting: Family

## 2019-09-14 DIAGNOSIS — I872 Venous insufficiency (chronic) (peripheral): Secondary | ICD-10-CM

## 2019-09-20 ENCOUNTER — Ambulatory Visit (INDEPENDENT_AMBULATORY_CARE_PROVIDER_SITE_OTHER): Payer: Medicare Other | Admitting: General Practice

## 2019-09-20 DIAGNOSIS — I480 Paroxysmal atrial fibrillation: Secondary | ICD-10-CM | POA: Diagnosis not present

## 2019-09-20 LAB — POCT INR: INR: 3.2 — AB (ref 2.0–3.0)

## 2019-09-20 NOTE — Patient Instructions (Addendum)
Pre visit review using our clinic review tool, if applicable. No additional management support is needed unless otherwise documented below in the visit note.  Hold dosage today and then continue to take  5 mg daily except 2.5 mg on Mon, Wed and Fridays and Saturdays.  Re-check in 2 weeks.  Dosing instructions  left on Lisa RN @ [facility] and also on patient's home VM.  [phone] Lattie Haw, RN @ [facility])

## 2019-09-23 ENCOUNTER — Telehealth: Payer: Self-pay | Admitting: Family Medicine

## 2019-09-23 NOTE — Telephone Encounter (Signed)
Verbal orders given to [facility].

## 2019-09-23 NOTE — Telephone Encounter (Signed)
Verbal order OK as requested.

## 2019-09-23 NOTE — Telephone Encounter (Signed)
Please advise 

## 2019-09-23 NOTE — Telephone Encounter (Signed)
Kathlee Nations is calling in stating that the pt found a object in his shoe and it has irritated his R great toe measuring 1.8 cm x 0.9 cm x 0.1 depth and they would like to have a order for calcium alginate dry dressing and change daily.   Verbal orders can be left on secured voice mail.

## 2019-09-28 ENCOUNTER — Other Ambulatory Visit: Payer: Self-pay | Admitting: Family

## 2019-09-28 ENCOUNTER — Other Ambulatory Visit: Payer: Self-pay | Admitting: Family Medicine

## 2019-09-28 DIAGNOSIS — I872 Venous insufficiency (chronic) (peripheral): Secondary | ICD-10-CM

## 2019-09-28 DIAGNOSIS — E1140 Type 2 diabetes mellitus with diabetic neuropathy, unspecified: Secondary | ICD-10-CM

## 2019-09-28 NOTE — Telephone Encounter (Deleted)
rx refilled electronically

## 2019-09-28 NOTE — Telephone Encounter (Signed)
Refill for 6 months. 

## 2019-09-28 NOTE — Telephone Encounter (Signed)
Pt call and need a refill on furosemide (LASIX) 40 MG tablet sent to  [id number] - [place], [place] Phone:  [phone]  Fax:  [phone]

## 2019-09-29 MED ORDER — FUROSEMIDE 40 MG PO TABS: 40.0000 mg | ORAL_TABLET | Freq: Every day | ORAL | 1 refills | Status: DC

## 2019-09-29 NOTE — Addendum Note (Signed)
Addended by: Nathanial Millman E on: 09/29/2019 11:01 AM   Modules accepted: Orders

## 2019-09-29 NOTE — Telephone Encounter (Signed)
Rx sent in

## 2019-10-03 ENCOUNTER — Other Ambulatory Visit: Payer: Self-pay | Admitting: Family Medicine

## 2019-10-03 ENCOUNTER — Other Ambulatory Visit: Payer: Self-pay | Admitting: Family

## 2019-10-03 DIAGNOSIS — E1140 Type 2 diabetes mellitus with diabetic neuropathy, unspecified: Secondary | ICD-10-CM

## 2019-10-03 DIAGNOSIS — I872 Venous insufficiency (chronic) (peripheral): Secondary | ICD-10-CM

## 2019-10-07 ENCOUNTER — Telehealth: Payer: Self-pay | Admitting: Family Medicine

## 2019-10-07 ENCOUNTER — Telehealth: Payer: Medicare Other | Admitting: Family Medicine

## 2019-10-07 ENCOUNTER — Encounter: Payer: Self-pay | Admitting: Family Medicine

## 2019-10-07 ENCOUNTER — Other Ambulatory Visit: Payer: Self-pay

## 2019-10-07 ENCOUNTER — Ambulatory Visit (INDEPENDENT_AMBULATORY_CARE_PROVIDER_SITE_OTHER): Payer: Medicare Other | Admitting: General Practice

## 2019-10-07 DIAGNOSIS — I480 Paroxysmal atrial fibrillation: Secondary | ICD-10-CM

## 2019-10-07 LAB — POCT INR: INR: 2.3 (ref 2.0–3.0)

## 2019-10-07 NOTE — Telephone Encounter (Signed)
Tracy weighed patient and his weight is 239. She thinks he is retaining fluid. She said you can call her if you need too. She knows he has appointment at 12:15 today but she won't be with patient.

## 2019-10-07 NOTE — Patient Instructions (Signed)
Pre visit review using our clinic review tool, if applicable. No additional management support is needed unless otherwise documented below in the visit note.  Continue to take  5 mg daily except 2.5 mg on Mon, Wed and Fridays and Saturdays.  Re-check in 2 weeks.  Dosing instruction given to Olivia Mackie, RN @ [facility].  [phone].

## 2019-10-07 NOTE — Telephone Encounter (Signed)
Please see message. °

## 2019-10-07 NOTE — Progress Notes (Signed)
Medical screening examination/treatment/procedure(s) were performed by non-physician practitioner and as supervising physician I was immediately available for consultation/collaboration. I agree with above. Elif Yonts, MD   

## 2019-10-07 NOTE — Telephone Encounter (Signed)
Gary Yates with [facility] wanted Dr. Elease Hashimoto to know that when they changed pt's catheter on Monday they noticed the one they used in August was a 2 day catheter vs the regular catheter. Pt is now using the regular catheter.

## 2019-10-07 NOTE — Telephone Encounter (Signed)
That is up from 223  last visit to 239.   Increase Lasix to 40 mg bid for 5 days and then drop back to 40 mg daily   set up office follow up for next week (30 minute follow up)

## 2019-10-07 NOTE — Progress Notes (Signed)
Called multiple phone numbers in chart and sent link at time of visit with now answer. Sent message to [facility] front office  and Wendie Simmer letting them know was unable to reach in case he calls in. Advised after review of chart inperson evaluation would be better if possible rather than phone visit given hx and notes in chart.

## 2019-10-08 ENCOUNTER — Other Ambulatory Visit: Payer: Self-pay

## 2019-10-08 ENCOUNTER — Telehealth: Payer: Self-pay | Admitting: Family Medicine

## 2019-10-08 ENCOUNTER — Ambulatory Visit (INDEPENDENT_AMBULATORY_CARE_PROVIDER_SITE_OTHER): Payer: Medicare Other | Admitting: Family Medicine

## 2019-10-08 ENCOUNTER — Encounter: Payer: Self-pay | Admitting: Family Medicine

## 2019-10-08 VITALS — BP 130/64 | HR 84 | Temp 97.9°F | Wt 241.0 lb

## 2019-10-08 DIAGNOSIS — E669 Obesity, unspecified: Secondary | ICD-10-CM | POA: Diagnosis not present

## 2019-10-08 DIAGNOSIS — R600 Localized edema: Secondary | ICD-10-CM | POA: Diagnosis not present

## 2019-10-08 NOTE — Telephone Encounter (Signed)
Called Olivia Mackie with [facility] and gave her the message from Dr. Elease Hashimoto. Let Olivia Mackie know patient has an appointment today. Olivia Mackie verbalized an understanding.

## 2019-10-08 NOTE — Telephone Encounter (Signed)
noted 

## 2019-10-08 NOTE — Telephone Encounter (Signed)
[facility]  calling to inform Dr. Elease Hashimoto that they are extending his PT  will fax over an order

## 2019-10-08 NOTE — Progress Notes (Signed)
Established Patient Office Visit  Subjective:  Patient ID: Gary Feutz., male    DOB: 08/26/1933  Age: 86 y.o. MRN: [id number]  CC:  Chief Complaint  Patient presents with   Leg Swelling    BIlateral   Foot Swelling    Bilateral    HPI Gary Yates. presents for bilateral leg and foot edema.  He has multiple chronic problems including history of CAD, hypertension, atrial fibrillation, COPD, obstructive sleep apnea, type 2 diabetes, peripheral neuropathy, chronic kidney disease, history of non-Hodgkin's lymphoma, restless leg syndrome.  He has had some problems previously with increased peripheral edema.  Does a lot of sitting.  He has had difficulty elevating legs very much.  He does not have any history of heart failure.  Denies any dyspnea at rest.  No orthopnea.  Also has history of low albumin with most recent 2.5.  He takes gabapentin currently 300 mg twice daily which could also be contributing to his edema.  Suspect venous stasis as well.  His weight has gone up from 223 pounds to 239 pounds recently over a couple of weeks.  Previous BNP levels have been normal.  Currently on Lasix 40 mg daily.  He does have some chronic kidney disease.  No recent dietary changes  Past Medical History:  Diagnosis Date   Anemia in chronic renal disease 05/07/2017   Anxiety    Atrial fibrillation (HCC)    COPD (chronic obstructive pulmonary disease) (HCC)    pt. denies   Coronary artery disease    a. h/o Overlapping stents RCA;  b. 06/2011 Cath: patent stents, nonobs dzs, NL EF.   Diabetic peripheral neuropathy (HCC)    Diffuse non-Hodgkin's lymphoma of testis ([place]) 09/28/2015   DM (diabetes mellitus) ([place])    Type 2, peripheral neuropathy.   Dyspnea    with exertion   Dysrhythmia    GERD (gastroesophageal reflux disease)    Headache    History of bronchitis    History of kidney stones    History of radiation therapy 02/19/16 - 03/13/16   Testis/Scrotum: 32.4 Gy in 18  fractions   History of radiation therapy 08/07/16-08/20/16   left adrenal gland mass treated to 30 Gy in 10 fractions   Hyperlipidemia    Hypertension    Iron deficiency anemia due to chronic blood loss 08/08/2017   Low testosterone    Nephrolithiasis    OSA (obstructive sleep apnea) 11/26/2017   Osteoarthritis    shoulder   Restless leg    SVT (supraventricular tachycardia) ([place])    Urinary frequency    Wears partial dentures    upper and lower    Past Surgical History:  Procedure Laterality Date   [place]  01/2013   CATARACT EXTRACTION, BILATERAL     CHOLECYSTECTOMY     COLONOSCOPY     CORONARY ANGIOPLASTY  2004   CYSTOSCOPY N/A 08/18/2017   Procedure: CYSTOSCOPY WITH FULGURATION AND SUPRA PUBIC TUBE PLACEMENT;  Surgeon: Kathie Rhodes, MD;  Location: [facility];  Service: Urology;  Laterality: N/A;   EYE SURGERY Bilateral    cataracts   IR GENERIC HISTORICAL  10/05/2015   IR US GUIDE VASC ACCESS RIGHT 10/05/2015 Marybelle Killings, MD [facility]   IR GENERIC HISTORICAL  10/05/2015   IR FLUORO GUIDE PORT INSERTION RIGHT 10/05/2015 Marybelle Killings, MD [facility]   LEFT HEART CATHETERIZATION WITH CORONARY ANGIOGRAM N/A  06/18/2011   Procedure: LEFT HEART CATHETERIZATION WITH CORONARY ANGIOGRAM;  Surgeon: Peter M Martinique, MD;  Location: [facility] CATH LAB;  Service: Cardiovascular;  Laterality: N/A;   LEFT HEART CATHETERIZATION WITH CORONARY ANGIOGRAM N/A 01/27/2013   Procedure: LEFT HEART CATHETERIZATION WITH CORONARY ANGIOGRAM;  Surgeon: Burnell Blanks, MD;  Location: [facility] CATH LAB;  Service: Cardiovascular;  Laterality: N/A;   LUMBAR LAMINECTOMY/DECOMPRESSION MICRODISCECTOMY N/A 02/07/2015   Procedure: Lumbar three-Sacral one Decompression;  Surgeon: Kevan Ny Ditty, MD;  Location: [place] NEURO ORS;  Service: Neurosurgery;  Laterality: N/A;  L3 to S1 Decompression   MULTIPLE TOOTH EXTRACTIONS     ORCHIECTOMY  Right 09/01/2015   Procedure: RIGHT ORCHIECTOMY;  Surgeon: Kathie Rhodes, MD;  Location: [facility];  Service: Urology;  Laterality: Right;   port a cath in place      ROTATOR CUFF REPAIR Left     Family History  Problem Relation Age of Onset   Alzheimer's disease Mother    Heart disease Mother    Heart disease Father    Migraines Father    Ulcers Father    Prostate cancer Brother    Coronary artery disease Other        Male 1st degree relative <50   Coronary artery disease Other        male 1st degree relative <60   Heart disease Sister    Obesity Sister        Morbid   Arthritis Sister    Heart disease Brother    Arthritis Brother    Sleep apnea Son    Obesity Son    Migraines Daughter    Thyroid disease Daughter     Social History   Socioeconomic History   Marital status: Married    Spouse name: Not on file   Number of children: 3   Years of education: Not on file   Highest education level: Not on file  Occupational History   Occupation: Retired    Fish farm manager: RETIRED  Tobacco Use   Smoking status: Former Smoker    Packs/day: 1.50    Years: 20.00    Pack years: 30.00    Types: Cigarettes    Quit date: 04/05/1978    Years since quitting: 41.5   Smokeless tobacco: Never Used  Vaping Use   Vaping Use: Never used  Substance and Sexual Activity   Alcohol use: No   Drug use: No   Sexual activity: Yes    Birth control/protection: None    Comment: Married  Other Topics Concern   Not on file  Social History Narrative   Not on file   Social Determinants of Health   Financial Resource Strain:    Difficulty of Paying Living Expenses: Not on file  Food Insecurity:    Worried About Charity fundraiser in the Last Year: Not on file   [employer] of Food in the Last Year: Not on file  Transportation Needs:    Lack of Transportation (Medical): Not on file   Lack of Transportation (Non-Medical): Not on file  Physical Activity:     Days of Exercise per Week: Not on file   Minutes of Exercise per Session: Not on file  Stress:    Feeling of Stress : Not on file  Social Connections:    Frequency of Communication with Friends and Family: Not on file   Frequency of Social Gatherings with Friends and Family: Not on file   Attends Religious Services: Not on file  Active Member of Clubs or Organizations: Not on file   Attends Archivist Meetings: Not on file   Marital Status: Not on file  Intimate Partner Violence:    Fear of Current or Ex-Partner: Not on file   Emotionally Abused: Not on file   Physically Abused: Not on file   Sexually Abused: Not on file    Outpatient Medications Prior to Visit  Medication Sig Dispense Refill   acetaminophen (TYLENOL) 500 MG tablet Take 1,000 mg by mouth every 6 (six) hours as needed (for pain/fever/headaches.).      albuterol (VENTOLIN HFA) 108 (90 Base) MCG/ACT inhaler Inhale 2 puffs into the lungs every 4 (four) hours as needed for wheezing or shortness of breath. 18 g 0   atorvastatin (LIPITOR) 40 MG tablet TAKE 1 TABLET BY MOUTH EVERY DAY 90 tablet 1   docusate sodium (COLACE) 50 MG capsule Take 1 capsule (50 mg total) by mouth 2 (two) times daily as needed (for constipation). 90 capsule 0   furosemide (LASIX) 40 MG tablet Take 1 tablet (40 mg total) by mouth daily. 90 tablet 1   Melatonin 1 MG TABS Take 1 tablet (1 mg total) by mouth at bedtime. 30 tablet 2   METFORMIN HCL PO Take 500 mg by mouth. 1 tablet by mouth twice daily with meal     nitroGLYCERIN (NITROSTAT) 0.4 MG SL tablet Place 1 tablet (0.4 mg total) under the tongue every 5 (five) minutes as needed. Chest pain 25 tablet 6   nystatin (MYCOSTATIN/NYSTOP) powder APPLY TO AFFECTED AREA 4 TIMES A DAY 60 g 0   nystatin-triamcinolone ointment (MYCOLOG) Apply 1 application topically 2 (two) times daily. 60 g 0   ondansetron (ZOFRAN ODT) 4 MG disintegrating tablet 104m ODT q4 hours prn  nausea/vomit 20 tablet 0   polyethylene glycol (MIRALAX / GLYCOLAX) packet Take 17 g by mouth daily. 14 each 0   warfarin (COUMADIN) 2.5 MG tablet Take 1 tablet (2.5 mg total) by mouth once a week. On Wednesdays. 4 tablet 0   warfarin (COUMADIN) 5 MG tablet Take 1 tablet (5 mg total) by mouth daily. 30 tablet 0   gabapentin (NEURONTIN) 300 MG capsule Take 2 capsules (600 mg total) by mouth 3 (three) times daily. 180 capsule 0   metoprolol succinate (TOPROL-XL) 25 MG 24 hr tablet Take 1 tablet (25 mg total) by mouth daily. 30 tablet 0   pantoprazole (PROTONIX) 40 MG tablet Take 1 tablet (40 mg total) by mouth daily. 30 tablet 0   potassium chloride SA (KLOR-CON M20) 20 MEQ tablet Take 1 tablet (20 mEq total) by mouth daily. 30 tablet 0   pramipexole (MIRAPEX) 1.5 MG tablet Take 2 tablets (3 mg total) by mouth every evening. 60 tablet 0   No facility-administered medications prior to visit.    Allergies  Allergen Reactions   Latex Other (See Comments)    Reddens the skin   Ace Inhibitors Cough   Codeine Nausea Only and Rash        Penicillins Rash    Childhood allergy Has patient had a PCN reaction causing immediate rash, facial/tongue/throat swelling, SOB or lightheadedness with hypotension: Yes Has patient had a PCN reaction causing severe rash involving mucus membranes or skin necrosis: Yes Has patient had a PCN reaction that required hospitalization No Has patient had a PCN reaction occurring within the last 10 years: No If all of the above answers are "NO", then may proceed with Cephalosporin use.  ROS Review of Systems  Constitutional: Positive for unexpected weight change. Negative for chills, fatigue and fever.  Eyes: Negative for visual disturbance.  Respiratory: Negative for cough, chest tightness, shortness of breath and wheezing.   Cardiovascular: Positive for leg swelling. Negative for chest pain and palpitations.  Genitourinary: Negative for difficulty  urinating.  Neurological: Negative for dizziness, syncope, weakness, light-headedness and headaches.      Objective:    Physical Exam Vitals reviewed.  Cardiovascular:     Rate and Rhythm: Normal rate.  Pulmonary:     Effort: Pulmonary effort is normal.     Breath sounds: Normal breath sounds.  Musculoskeletal:     Comments: He has 2+ pitting edema legs ankles feet bilaterally.  No open ulcers noted.  No weeping edema at this time.  Legs are very tight and tense  Neurological:     Mental Status: He is alert.     BP 130/64    Pulse 84    Temp 97.9 F (36.6 C) (Oral)    Wt 241 lb (109.3 kg)    SpO2 98%    BMI 35.59 kg/m  Wt Readings from Last 3 Encounters:  10/08/19 241 lb (109.3 kg)  08/27/19 223 lb 1.6 oz (101.2 kg)  08/23/19 238 lb (108 kg)     Health Maintenance Due  Topic Date Due   FOOT EXAM  08/06/2014   OPHTHALMOLOGY EXAM  07/23/2019   INFLUENZA VACCINE  08/08/2019    There are no preventive care reminders to display for this patient.  Lab Results  Component Value Date   TSH 4.795 (H) 08/05/2018   Lab Results  Component Value Date   WBC 5.5 08/18/2019   HGB 10.3 (A) 08/18/2019   HCT 31 (A) 08/18/2019   MCV 95.4 08/11/2019   PLT 144 (A) 08/18/2019   Lab Results  Component Value Date   NA 139 08/27/2019   K 4.5 08/27/2019   CHLORIDE 104 12/14/2015   CO2 27 08/27/2019   GLUCOSE 138 (H) 08/27/2019   BUN 33 (H) 08/27/2019   CREATININE 1.89 (H) 08/27/2019   BILITOT 0.6 08/08/2019   ALKPHOS 69 08/08/2019   AST 13 (L) 08/08/2019   ALT 6 08/08/2019   PROT 5.5 (L) 08/08/2019   ALBUMIN 2.5 (L) 08/08/2019   CALCIUM 9.1 08/27/2019   ANIONGAP 11 08/11/2019   EGFR 76 (L) 12/14/2015   GFR 38.06 (L) 03/22/2019   Lab Results  Component Value Date   CHOL 166 03/22/2019   Lab Results  Component Value Date   HDL 29.80 (L) 03/22/2019   Lab Results  Component Value Date   LDLCALC 53 09/15/2017   Lab Results  Component Value Date   TRIG 373.0 (H)  03/22/2019   Lab Results  Component Value Date   CHOLHDL 6 03/22/2019   Lab Results  Component Value Date   HGBA1C 6.8 (H) 08/09/2019      Assessment & Plan:   Problem List Items Addressed This Visit    None    Visit Diagnoses    Bilateral leg edema    -  Primary   Relevant Orders   Basic metabolic panel   Brain Natriuretic Peptide    Patient has severe bilateral leg edema with recent exacerbation.  No history of heart failure..  Suspect multifactorial with venous stasis, gabapentin therapy, low albumin, sedentary lifestyle  -Elevate legs much as possible -Continue daily weights to monitor -Increase furosemide to 80 mg daily for the next 5 days and  try dropping back -Try reducing gabapentin to 200 mg twice daily and if tolerating well we may taper back further -Recommend protein/nutrition supplements such as Glucerna.  We have also recommend try to increase his protein intake through high-quality proteins such as low sugar yogurt, eggs, fish, chicken, etc.  No orders of the defined types were placed in this encounter.   Follow-up: No follow-ups on file.    Carolann Littler, MD

## 2019-10-08 NOTE — Telephone Encounter (Signed)
Noted  

## 2019-10-08 NOTE — Patient Instructions (Signed)
Increase the Furosemide to 80 mg once daily for 5 days and then drop back to 40 mg daily  Elevate legs frequently  Try to scale back Gabapentin to 200 mg twice daily  Increase protein rich foods- eggs, fish, chicken, beans, Glucerna.

## 2019-10-09 LAB — BRAIN NATRIURETIC PEPTIDE: Brain Natriuretic Peptide: 14 pg/mL (ref <100)

## 2019-10-09 LAB — BASIC METABOLIC PANEL
BUN/Creatinine Ratio: 12 (calc) (ref 6–22)
BUN: 21 mg/dL (ref 7–25)
CO2: 26 mmol/L (ref 20–32)
Calcium: 8.8 mg/dL (ref 8.6–10.3)
Chloride: 104 mmol/L (ref 98–110)
Creat: 1.76 mg/dL — ABNORMAL HIGH (ref 0.70–1.11)
Glucose, Bld: 134 mg/dL — ABNORMAL HIGH (ref 65–99)
Potassium: 4.2 mmol/L (ref 3.5–5.3)
Sodium: 138 mmol/L (ref 135–146)

## 2019-10-11 ENCOUNTER — Encounter: Payer: Self-pay | Admitting: Hematology & Oncology

## 2019-10-11 ENCOUNTER — Other Ambulatory Visit: Payer: Self-pay | Admitting: *Deleted

## 2019-10-11 ENCOUNTER — Inpatient Hospital Stay: Payer: Medicare Other | Attending: Hematology & Oncology

## 2019-10-11 ENCOUNTER — Other Ambulatory Visit: Payer: Self-pay

## 2019-10-11 ENCOUNTER — Inpatient Hospital Stay (HOSPITAL_BASED_OUTPATIENT_CLINIC_OR_DEPARTMENT_OTHER): Payer: Medicare Other | Admitting: Hematology & Oncology

## 2019-10-11 ENCOUNTER — Inpatient Hospital Stay: Payer: Medicare Other

## 2019-10-11 VITALS — BP 133/73 | HR 78 | Temp 98.0°F | Resp 17 | Wt 233.0 lb

## 2019-10-11 DIAGNOSIS — E1140 Type 2 diabetes mellitus with diabetic neuropathy, unspecified: Secondary | ICD-10-CM

## 2019-10-11 DIAGNOSIS — J3489 Other specified disorders of nose and nasal sinuses: Secondary | ICD-10-CM | POA: Diagnosis not present

## 2019-10-11 DIAGNOSIS — Z95828 Presence of other vascular implants and grafts: Secondary | ICD-10-CM

## 2019-10-11 DIAGNOSIS — Z79899 Other long term (current) drug therapy: Secondary | ICD-10-CM | POA: Diagnosis not present

## 2019-10-11 DIAGNOSIS — Z9221 Personal history of antineoplastic chemotherapy: Secondary | ICD-10-CM | POA: Diagnosis not present

## 2019-10-11 DIAGNOSIS — M549 Dorsalgia, unspecified: Secondary | ICD-10-CM | POA: Diagnosis not present

## 2019-10-11 DIAGNOSIS — I4891 Unspecified atrial fibrillation: Secondary | ICD-10-CM | POA: Diagnosis not present

## 2019-10-11 DIAGNOSIS — D500 Iron deficiency anemia secondary to blood loss (chronic): Secondary | ICD-10-CM | POA: Diagnosis not present

## 2019-10-11 DIAGNOSIS — Z452 Encounter for adjustment and management of vascular access device: Secondary | ICD-10-CM | POA: Diagnosis not present

## 2019-10-11 DIAGNOSIS — N184 Chronic kidney disease, stage 4 (severe): Secondary | ICD-10-CM

## 2019-10-11 DIAGNOSIS — Z7901 Long term (current) use of anticoagulants: Secondary | ICD-10-CM | POA: Diagnosis not present

## 2019-10-11 DIAGNOSIS — E1165 Type 2 diabetes mellitus with hyperglycemia: Secondary | ICD-10-CM

## 2019-10-11 DIAGNOSIS — Z7984 Long term (current) use of oral hypoglycemic drugs: Secondary | ICD-10-CM | POA: Insufficient documentation

## 2019-10-11 DIAGNOSIS — R600 Localized edema: Secondary | ICD-10-CM | POA: Diagnosis not present

## 2019-10-11 DIAGNOSIS — N181 Chronic kidney disease, stage 1: Secondary | ICD-10-CM | POA: Diagnosis not present

## 2019-10-11 DIAGNOSIS — C8589 Other specified types of non-Hodgkin lymphoma, extranodal and solid organ sites: Secondary | ICD-10-CM | POA: Diagnosis present

## 2019-10-11 DIAGNOSIS — Z923 Personal history of irradiation: Secondary | ICD-10-CM | POA: Diagnosis not present

## 2019-10-11 DIAGNOSIS — D631 Anemia in chronic kidney disease: Secondary | ICD-10-CM

## 2019-10-11 DIAGNOSIS — H538 Other visual disturbances: Secondary | ICD-10-CM | POA: Diagnosis not present

## 2019-10-11 LAB — CBC WITH DIFFERENTIAL (CANCER CENTER ONLY)
Abs Immature Granulocytes: 0.03 10*3/uL (ref 0.00–0.07)
Basophils Absolute: 0.1 10*3/uL (ref 0.0–0.1)
Basophils Relative: 1 %
Eosinophils Absolute: 0.2 10*3/uL (ref 0.0–0.5)
Eosinophils Relative: 3 %
HCT: 29.4 % — ABNORMAL LOW (ref 39.0–52.0)
Hemoglobin: 9.6 g/dL — ABNORMAL LOW (ref 13.0–17.0)
Immature Granulocytes: 0 %
Lymphocytes Relative: 25 %
Lymphs Abs: 1.8 10*3/uL (ref 0.7–4.0)
MCH: 31.7 pg (ref 26.0–34.0)
MCHC: 32.7 g/dL (ref 30.0–36.0)
MCV: 97 fL (ref 80.0–100.0)
Monocytes Absolute: 0.6 10*3/uL (ref 0.1–1.0)
Monocytes Relative: 9 %
Neutro Abs: 4.5 10*3/uL (ref 1.7–7.7)
Neutrophils Relative %: 62 %
Platelet Count: 154 10*3/uL (ref 150–400)
RBC: 3.03 MIL/uL — ABNORMAL LOW (ref 4.22–5.81)
RDW: 14.1 % (ref 11.5–15.5)
WBC Count: 7.2 10*3/uL (ref 4.0–10.5)
nRBC: 0 % (ref 0.0–0.2)

## 2019-10-11 LAB — CMP (CANCER CENTER ONLY)
ALT: 3 U/L (ref 0–44)
AST: 12 U/L — ABNORMAL LOW (ref 15–41)
Albumin: 3.9 g/dL (ref 3.5–5.0)
Alkaline Phosphatase: 77 U/L (ref 38–126)
Anion gap: 11 (ref 5–15)
BUN: 27 mg/dL — ABNORMAL HIGH (ref 8–23)
CO2: 27 mmol/L (ref 22–32)
Calcium: 9.5 mg/dL (ref 8.9–10.3)
Chloride: 100 mmol/L (ref 98–111)
Creatinine: 1.69 mg/dL — ABNORMAL HIGH (ref 0.61–1.24)
GFR, Est AFR Am: 42 mL/min — ABNORMAL LOW (ref >60)
GFR, Estimated: 36 mL/min — ABNORMAL LOW (ref >60)
Glucose, Bld: 119 mg/dL — ABNORMAL HIGH (ref 70–99)
Potassium: 3.6 mmol/L (ref 3.5–5.1)
Sodium: 138 mmol/L (ref 135–145)
Total Bilirubin: 0.5 mg/dL (ref 0.3–1.2)
Total Protein: 7.1 g/dL (ref 6.5–8.1)

## 2019-10-11 LAB — HEMOGLOBIN A1C
Hgb A1c MFr Bld: 6.6 % — ABNORMAL HIGH (ref 4.8–5.6)
Mean Plasma Glucose: 142.72 mg/dL

## 2019-10-11 MED ORDER — SODIUM CHLORIDE 0.9% FLUSH
10.0000 mL | Freq: Once | INTRAVENOUS | Status: AC
  Administered 2019-10-11: 10 mL via INTRAVENOUS
  Filled 2019-10-11: qty 10

## 2019-10-11 MED ORDER — EPOETIN ALFA-EPBX 40000 UNIT/ML IJ SOLN
40000.0000 [IU] | Freq: Once | INTRAMUSCULAR | Status: AC
  Administered 2019-10-11: 40000 [IU] via SUBCUTANEOUS

## 2019-10-11 MED ORDER — HEPARIN SOD (PORK) LOCK FLUSH 100 UNIT/ML IV SOLN
500.0000 [IU] | Freq: Once | INTRAVENOUS | Status: AC
  Administered 2019-10-11: 500 [IU] via INTRAVENOUS
  Filled 2019-10-11: qty 5

## 2019-10-11 MED ORDER — EPOETIN ALFA-EPBX 40000 UNIT/ML IJ SOLN
INTRAMUSCULAR | Status: AC
  Filled 2019-10-11: qty 1

## 2019-10-11 NOTE — Patient Instructions (Signed)

## 2019-10-11 NOTE — Progress Notes (Signed)
Hematology and Oncology Follow Up Visit  Gary Yates [id number] 1933/01/13 86 y.o. 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  Her problem that he has is significant edema down his legs below his knees.  He has been hospitalized back in the summer.  He had work-up for all this.  He has CT scans.  CT scans were really unremarkable.  He has been no evidence of lymphoma recurrence.  I think he was in rehab for a couple weeks.  He still has quite a bit of edema.  He is on diuretics.  He does have an indwelling Foley catheter which actually has helped him.  He empties the Foley bag several times a day.  His poor wife is really suffering from dementia.  She did not even know who I was.  This is very sad.  I really hate this for him and for her.  It has now been 2-1/2 years that he has had any problems with recurrent lymphoma.  He has the atrial fibrillation.  He is on Coumadin.  Cardiology is managing this.  Overall, his performance status is ECOG 2-3.    Medications:  Current Outpatient Medications:  .  acetaminophen (TYLENOL) 500 MG tablet, Take 1,000 mg by mouth every 6 (six) hours as needed (for pain/fever/headaches.). , Disp: , Rfl:  .  atorvastatin (LIPITOR) 40 MG tablet, TAKE 1 TABLET BY MOUTH EVERY DAY, Disp: 90 tablet, Rfl: 1 .  docusate sodium (COLACE) 50 MG capsule, Take 1 capsule (50 mg total) by mouth 2 (two) times daily as needed (for constipation)., Disp: 90 capsule,  Rfl: 0 .  furosemide (LASIX) 40 MG tablet, Take 1 tablet (40 mg total) by mouth daily., Disp: 90 tablet, Rfl: 1 .  gabapentin (NEURONTIN) 300 MG capsule, Take 600 mg by mouth 2 (two) times daily., Disp: , Rfl:  .  METFORMIN HCL PO, Take 500 mg by mouth. 1 tablet by mouth twice daily with meal, Disp: , Rfl:  .  metoprolol succinate (TOPROL-XL) 25 MG 24 hr tablet, Take 25 mg by mouth daily., Disp: , Rfl:  .  nystatin (MYCOSTATIN/NYSTOP) powder, APPLY TO AFFECTED AREA 4 TIMES A DAY, Disp: 60 g, Rfl: 0 .  nystatin-triamcinolone ointment (MYCOLOG), Apply 1 application topically 2 (two) times daily., Disp: 60 g, Rfl: 0 .  polyethylene glycol (MIRALAX / GLYCOLAX) packet, Take 17 g by mouth daily., Disp: 14 each, Rfl: 0 .  warfarin (COUMADIN) 5 MG tablet, Take 1 tablet (5 mg total) by mouth daily. (Patient taking differently: Take 5 mg by mouth daily. Takes 5mg  T,Th, and Sunday. Takes 2.5mg  M, W, F, and Sat.), Disp: 30 tablet, Rfl: 0 .  albuterol (VENTOLIN HFA) 108 (90 Base) MCG/ACT inhaler, Inhale 2 puffs into the lungs every 4 (four) hours as needed for wheezing or shortness of breath. (Patient not taking: Reported on 10/11/2019), Disp: 18  g, Rfl: 0 .  gabapentin (NEURONTIN) 300 MG capsule, Take 2 capsules (600 mg total) by mouth 3 (three) times daily., Disp: 180 capsule, Rfl: 0 .  metoprolol succinate (TOPROL-XL) 25 MG 24 hr tablet, Take 1 tablet (25 mg total) by mouth daily., Disp: 30 tablet, Rfl: 0 .  nitroGLYCERIN (NITROSTAT) 0.4 MG SL tablet, Place 1 tablet (0.4 mg total) under the tongue every 5 (five) minutes as needed. Chest pain (Patient not taking: Reported on 10/11/2019), Disp: 25 tablet, Rfl: 6 .  pantoprazole (PROTONIX) 40 MG tablet, Take 1 tablet (40 mg total) by mouth daily., Disp: 30 tablet, Rfl: 0 .  potassium chloride SA (KLOR-CON M20) 20 MEQ tablet, Take 1 tablet (20 mEq total) by mouth daily., Disp: 30 tablet, Rfl: 0 .  pramipexole (MIRAPEX) 1.5 MG tablet, Take 2 tablets (3 mg total) by  mouth every evening., Disp: 60 tablet, Rfl: 0  Allergies:  Allergies  Allergen Reactions  . Latex Other (See Comments)    Reddens the skin  . Ace Inhibitors Cough  . Codeine Nausea Only and Rash       . Penicillins Rash    Childhood allergy Has patient had a PCN reaction causing immediate rash, facial/tongue/throat swelling, SOB or lightheadedness with hypotension: Yes Has patient had a PCN reaction causing severe rash involving mucus membranes or skin necrosis: Yes Has patient had a PCN reaction that required hospitalization No Has patient had a PCN reaction occurring within the last 10 years: No If all of the above answers are "NO", then may proceed with Cephalosporin use.     Past Medical History, Surgical history, Social history, and Family History were reviewed and updated.  Review of Systems: Review of Systems  HENT: Positive for sinus pain and sore throat.   Eyes: Positive for blurred vision.  Respiratory: Positive for wheezing.   Cardiovascular: Positive for leg swelling.  Gastrointestinal: Negative.   Genitourinary: Negative.   Musculoskeletal: Positive for back pain.  Skin: Negative.   Neurological: Positive for weakness.  Endo/Heme/Allergies: Negative.   Psychiatric/Behavioral: Negative.     Physical Exam:  weight is 233 lb 0.6 oz (105.7 kg). His oral temperature is 98 F (36.7 C). His blood pressure is 133/73 and his pulse is 78. His respiration is 17 and oxygen saturation is 100%.   Wt Readings from Last 3 Encounters:  10/11/19 233 lb 0.6 oz (105.7 kg)  10/08/19 241 lb (109.3 kg)  08/27/19 223 lb 1.6 oz (101.2 kg)      Physical Exam Vitals reviewed.  HENT:     Head: Normocephalic and atraumatic.  Eyes:     Pupils: Pupils are equal, round, and reactive to light.  Cardiovascular:     Rate and Rhythm: Normal rate and regular rhythm.     Heart sounds: Normal heart sounds.  Pulmonary:     Effort: Pulmonary effort is normal.     Breath sounds: Normal  breath sounds.  Abdominal:     General: Bowel sounds are normal.     Palpations: Abdomen is soft.  Musculoskeletal:        General: No tenderness or deformity. Normal range of motion.     Cervical back: Normal range of motion.  Lymphadenopathy:     Cervical: No cervical adenopathy.  Skin:    General: Skin is warm and dry.     Findings: No erythema or rash.  Neurological:     Mental Status: He is alert and oriented to person, place, and time.  Psychiatric:  Behavior: Behavior normal.        Thought Content: Thought content normal.        Judgment: Judgment normal.      Lab Results  Component Value Date   WBC 7.2 10/11/2019   HGB 9.6 (L) 10/11/2019   HCT 29.4 (L) 10/11/2019   MCV 97.0 10/11/2019   PLT 154 10/11/2019     Chemistry      Component Value Date/Time   NA 138 10/08/2019 0100   NA 137 08/18/2019 0000   NA 141 01/10/2017 1136   NA 137 12/14/2015 1100   K 4.2 10/08/2019 0100   K 4.4 01/10/2017 1136   K 3.7 12/14/2015 1100   CL 104 10/08/2019 0100   CL 104 01/10/2017 1136   CO2 26 10/08/2019 0100   CO2 27 01/10/2017 1136   CO2 24 12/14/2015 1100   BUN 21 10/08/2019 0100   BUN 29 (A) 08/18/2019 0000   BUN 19 01/10/2017 1136   BUN 18.4 12/14/2015 1100   CREATININE 1.76 (H) 10/08/2019 0100   CREATININE 0.9 12/14/2015 1100   GLU 122 08/18/2019 0000      Component Value Date/Time   CALCIUM 8.8 10/08/2019 0100   CALCIUM 9.4 01/10/2017 1136   CALCIUM 9.5 12/14/2015 1100   ALKPHOS 69 08/08/2019 0534   ALKPHOS 112 (H) 01/10/2017 1136   ALKPHOS 102 12/14/2015 1100   AST 13 (L) 08/08/2019 0534   AST 12 (L) 04/14/2019 0828   AST 22 12/14/2015 1100   ALT 6 08/08/2019 0534   ALT 4 04/14/2019 0828   ALT 18 01/10/2017 1136   ALT 10 12/14/2015 1100   BILITOT 0.6 08/08/2019 0534   BILITOT 0.4 04/14/2019 0828   BILITOT 0.58 12/14/2015 1100      Impression and Plan: Gary Yates is an 86 year old white male. He has relapsed large cell non-Hodgkin lymphoma of  the testicle. He was on salvage chemotherapy with Rituxan/bendamustine/Velcade.  He did not really respond well to this.  We then treated him with dose reduced R-ICE.  He responded very well to this.  His last PET scan, which was done in November 2019, showed that he was in remission.    Today, we have to give him any Aranesp.  I am sure that this will help him.    We will see what his iron levels are.   I would like to get him back before the holiday season.  I want to make sure that we get his blood count as best as we can so we can enjoy the holidays.   Volanda Napoleon, MD 10/4/20213:58 PM

## 2019-10-11 NOTE — Patient Instructions (Signed)

## 2019-10-12 ENCOUNTER — Telehealth: Payer: Self-pay | Admitting: Hematology & Oncology

## 2019-10-12 LAB — IRON AND TIBC
Iron: 48 ug/dL (ref 42–163)
Saturation Ratios: 22 % (ref 20–55)
TIBC: 223 ug/dL (ref 202–409)
UIBC: 175 ug/dL (ref 117–376)

## 2019-10-12 LAB — FERRITIN: Ferritin: 953 ng/mL — ABNORMAL HIGH (ref 24–336)

## 2019-10-12 LAB — LACTATE DEHYDROGENASE: LDH: 190 U/L (ref 98–192)

## 2019-10-12 NOTE — Telephone Encounter (Signed)
Appointments scheduled calendar printed & mailed per 10/4 los

## 2019-10-14 ENCOUNTER — Telehealth: Payer: Self-pay | Admitting: Family Medicine

## 2019-10-14 NOTE — Telephone Encounter (Signed)
Spoke with the pt and informed him of the message below.  There are no openings available here tomorrow.  Patient stated he is going out of town tomorrow until 10/16.  Appt scheduled for 10/18 and message sent to PCP for any recommendations in the meantime per pts request.

## 2019-10-14 NOTE — Telephone Encounter (Signed)
Recommend follow up here by next week, if possible, and will check BMP then.

## 2019-10-14 NOTE — Telephone Encounter (Signed)
As of last Friday pt is down 8-9 lbs. Pt has swelling of both legs and weeping areas on back of L leg.

## 2019-10-14 NOTE — Telephone Encounter (Signed)
Gary Yates from [facility] was informed that Dr. Elease Hashimoto was in the office on Thursdays.  She stated that was ok and she stated understanding.  Patient was on the phone as well and provided date of birth.

## 2019-10-15 NOTE — Telephone Encounter (Signed)
Left a detailed message at the pts home number with the information below. 

## 2019-10-15 NOTE — Telephone Encounter (Signed)
Okay to see after he gets back into town

## 2019-10-18 ENCOUNTER — Telehealth: Payer: Self-pay | Admitting: Physician Assistant

## 2019-10-18 NOTE — Telephone Encounter (Signed)
Pt c/o swelling: STAT is pt has developed SOB within 24 hours  1) How much weight have you gained and in what time span? Weighs around 230-240 lbs, not sure how much weight gain has occurred    2) If swelling, where is the swelling located? Legs   3) Are you currently taking a fluid pill? Yes, PCP has upped dosage to two a day   4) Are you currently SOB? No   5) Do you have a log of your daily weights (if so, list)? No   6) Have you gained 3 pounds in a day or 5 pounds in a week? Yes, 3 lbs in a day   7) Have you traveled recently? Yes, went to the beach on 10/15/19.    Weeping started occurring on 10/16/19 while at the beach. An appt has been scheduled for tomorrow 10/19/19 in regards to this.

## 2019-10-18 NOTE — Telephone Encounter (Signed)
Thanks

## 2019-10-18 NOTE — Telephone Encounter (Signed)
Called patient's daughter (DPR) back about her message. She is not with the patient, but stated her sister took patient out of town this weekend. She stated patient's legs are still swollen and have not improved since seeing  Dr. Elease Hashimoto. She is not sure if patient went back from his normal dose of lasix after having it increased for 5 days. Patient also has some weeping from his BLE. Patient did have lab work done on 10/1 which showed elevated creatinine and normal BNP. Encouraged patient's daughter to have patient elevate his legs, wear his compression stockings, reduce his salt intake and take is lasix as directed. Patient's daughter verbalized understanding and will keep appointment tomorrow to be evaluated.

## 2019-10-19 ENCOUNTER — Encounter: Payer: Self-pay | Admitting: Physician Assistant

## 2019-10-19 ENCOUNTER — Other Ambulatory Visit: Payer: Self-pay

## 2019-10-19 ENCOUNTER — Ambulatory Visit: Payer: Medicare Other | Admitting: Physician Assistant

## 2019-10-19 VITALS — BP 124/68 | HR 86 | Ht 69.0 in | Wt 234.0 lb

## 2019-10-19 DIAGNOSIS — I87303 Chronic venous hypertension (idiopathic) without complications of bilateral lower extremity: Secondary | ICD-10-CM | POA: Diagnosis not present

## 2019-10-19 DIAGNOSIS — I2510 Atherosclerotic heart disease of native coronary artery without angina pectoris: Secondary | ICD-10-CM

## 2019-10-19 DIAGNOSIS — I10 Essential (primary) hypertension: Secondary | ICD-10-CM | POA: Diagnosis not present

## 2019-10-19 DIAGNOSIS — I471 Supraventricular tachycardia: Secondary | ICD-10-CM

## 2019-10-19 DIAGNOSIS — I480 Paroxysmal atrial fibrillation: Secondary | ICD-10-CM | POA: Diagnosis not present

## 2019-10-19 DIAGNOSIS — N1832 Chronic kidney disease, stage 3b: Secondary | ICD-10-CM

## 2019-10-19 MED ORDER — FUROSEMIDE 40 MG PO TABS: 60.0000 mg | ORAL_TABLET | Freq: Every day | ORAL | 1 refills | Status: DC

## 2019-10-19 NOTE — Progress Notes (Signed)
Cardiology Office Note:    Date:  10/19/2019   ID:  Gary Yates., DOB May 24, 1933, MRN [id number]  PCP:  [name], MD  [facility] Cardiologist:  Lauree Chandler, MD  [facility] Electrophysiologist:  None   Chief Complaint: LE edema  History of Present Illness:    Gary Wheeling. is a 86 y.o. male with a hx of coronary artery disease s/p PCI of RCA in 2004, paroxysmal atrial fibrillation, SVT, COPD, diabetes mellitus, hypertension, hyperlipidemia and chronic lower extremity edema presents for lower extremity edema evaluation.  Admitted August 2021 for lower extremity edema.  He was found to be SVT.  Given IV adenosine with termination.  Seen by Dr. Lovena Le in hospital.  Recommended continuation of beta-blocker.  May consider SVT ablation if recurrent symptomatic episodes.  He is now dealing with lower extremity edema for at least 3 months.  He was seen by PCP last week and increase Lasix to 80 mg daily.  While he was taking higher dose of Lasix his weight dropped 11 pounds from 241-230 LB.  His edema was improved at that time.  Now his weight increased to 234 today.  Home health nurse placed him on Ace wrap.  Denies chest pain, shortness of breath, orthopnea, PND, syncope, melena.  He tries to watch his salt intake.  Past Medical History:  Diagnosis Date  . Anemia in chronic renal disease 05/07/2017  . Anxiety   . Atrial fibrillation ([place])   . COPD (chronic obstructive pulmonary disease) ([place])    pt. denies  . Coronary artery disease    a. h/o Overlapping stents RCA;  b. 06/2011 Cath: patent stents, nonobs dzs, NL EF.  . Diabetic peripheral neuropathy ([place])   . Diffuse non-Hodgkin's lymphoma of testis ([place]) 09/28/2015  . DM (diabetes mellitus) ([place])    Type 2, peripheral neuropathy.  . Dyspnea    with exertion  . Dysrhythmia   . GERD (gastroesophageal reflux disease)   . Headache   . History of bronchitis   . History of kidney stones   . History of  radiation therapy 02/19/16 - 03/13/16   Testis/Scrotum: 32.4 Gy in 18 fractions  . History of radiation therapy 08/07/16-08/20/16   left adrenal gland mass treated to 30 Gy in 10 fractions  . Hyperlipidemia   . Hypertension   . Iron deficiency anemia due to chronic blood loss 08/08/2017  . Low testosterone   . Nephrolithiasis   . OSA (obstructive sleep apnea) 11/26/2017  . Osteoarthritis    shoulder  . Restless leg   . SVT (supraventricular tachycardia) ([place])   . Urinary frequency   . Wears partial dentures    upper and lower    Past Surgical History:  Procedure Laterality Date  . APPENDECTOMY    . [place]  . CARDIAC CATHETERIZATION  01/2013  . CATARACT EXTRACTION, BILATERAL    . CHOLECYSTECTOMY    . COLONOSCOPY    . CORONARY ANGIOPLASTY  2004  . CYSTOSCOPY N/A 08/18/2017   Procedure: CYSTOSCOPY WITH FULGURATION AND SUPRA PUBIC TUBE PLACEMENT;  Surgeon: Kathie Rhodes, MD;  Location: [facility] ORS;  Service: Urology;  Laterality: N/A;  . EYE SURGERY Bilateral    cataracts  . IR GENERIC HISTORICAL  10/05/2015   IR US GUIDE VASC ACCESS RIGHT 10/05/2015 Marybelle Killings, MD [facility]-INTER[facility] RAD  . IR GENERIC HISTORICAL  10/05/2015   IR FLUORO GUIDE PORT INSERTION RIGHT 10/05/2015 Marybelle Killings, MD [facility]  .  LEFT HEART CATHETERIZATION WITH CORONARY ANGIOGRAM N/A 06/18/2011   Procedure: LEFT HEART CATHETERIZATION WITH CORONARY ANGIOGRAM;  Surgeon: Peter M Martinique, MD;  Location: [facility] CATH LAB;  Service: Cardiovascular;  Laterality: N/A;  . LEFT HEART CATHETERIZATION WITH CORONARY ANGIOGRAM N/A 01/27/2013   Procedure: LEFT HEART CATHETERIZATION WITH CORONARY ANGIOGRAM;  Surgeon: Burnell Blanks, MD;  Location: [facility] C[facility] LAB;  Service: Cardiovascular;  Laterality: N/A;  . LUMBAR LAMINECTOMY/DECOMPRESSION MICRODISCECTOMY N/A 02/07/2015   Procedure: Lumbar three-Sacral one Decompression;  Surgeon: Kevan Ny Ditty, MD;  Location: [facility] NEURO ORS;  Service: Neurosurgery;  Laterality: N/A;  L3 to  S1 Decompression  . MULTIPLE TOOTH EXTRACTIONS    . ORCHIECTOMY Right 09/01/2015   Procedure: RIGHT ORCHIECTOMY;  Surgeon: Kathie Rhodes, MD;  Location: [facility] ORS;  Service: Urology;  Laterality: Right;  . port a cath in place     . ROTATOR CUFF REPAIR Left     Current Medications: Current Meds  Medication Sig  . acetaminophen (TYLENOL) 500 MG tablet Take 1,000 mg by mouth every 6 (six) hours as needed (for pain/fever/headaches.).   Marland Kitchen atorvastatin (LIPITOR) 40 MG tablet TAKE 1 TABLET BY MOUTH EVERY DAY  . docusate sodium (COLACE) 50 MG capsule Take 1 capsule (50 mg total) by mouth 2 (two) times daily as needed (for constipation).  . gabapentin (NEURONTIN) 300 MG capsule Take 600 mg by mouth 2 (two) times daily.  Marland Kitchen METFORMIN HCL PO Take 500 mg by mouth. 1 tablet by mouth twice daily with meal  . metoprolol succinate (TOPROL-XL) 25 MG 24 hr tablet Take 1 tablet (25 mg total) by mouth daily.  . nitroGLYCERIN (NITROSTAT) 0.4 MG SL tablet Place 1 tablet (0.4 mg total) under the tongue every 5 (five) minutes as needed. Chest pain  . nystatin (MYCOSTATIN/NYSTOP) powder APPLY TO AFFECTED AREA 4 TIMES A DAY  . nystatin-triamcinolone ointment (MYCOLOG) Apply 1 application topically 2 (two) times daily.  . pantoprazole (PROTONIX) 40 MG tablet Take 40 mg by mouth daily as needed (heartburn).  . polyethylene glycol (MIRALAX / GLYCOLAX) 17 g packet Take 17 g by mouth as needed.  . potassium chloride SA (KLOR-CON M20) 20 MEQ tablet Take 1 tablet (20 mEq total) by mouth daily.  . pramipexole (MIRAPEX) 1.5 MG tablet Take 2 tablets (3 mg total) by mouth every evening.  . warfarin (COUMADIN) 5 MG tablet Take 1 tablet (5 mg total) by mouth daily. (Patient taking differently: Take 5 mg by mouth daily. Takes 5mg  T,Th, and Sunday. Takes 2.5mg  M, W, F, and Sat.)  . [DISCONTINUED] furosemide (LASIX) 40 MG tablet Take 1 tablet (40 mg total) by mouth daily.  . [DISCONTINUED] polyethylene glycol (MIRALAX / GLYCOLAX) packet  Take 17 g by mouth daily. (Patient taking differently: Take 17 g by mouth daily. )     Allergies:   Latex, Ace inhibitors, Codeine, and Penicillins   Social History   Socioeconomic History  . Marital status: Married    Spouse name: Not on file  . Number of children: 3  . Years of education: Not on file  . Highest education level: Not on file  Occupational History  . Occupation: Retired    Fish farm manager: RETIRED  Tobacco Use  . Smoking status: Former Smoker    Packs/day: 1.50    Years: 20.00    Pack years: 30.00    Types: Cigarettes    Quit date: 04/05/1978    Years since quitting: 41.5  . Smokeless tobacco: Never Used  Vaping Use  .  Vaping Use: Never used  Substance and Sexual Activity  . Alcohol use: No  . Drug use: No  . Sexual activity: Yes    Birth control/protection: None    Comment: Married  Other Topics Concern  . Not on file  Social History Narrative  . Not on file   Social Determinants of Health   Financial Resource Strain:   . Difficulty of Paying Living Expenses: Not on file  Food Insecurity:   . Worried About Charity fundraiser in the Last Year: Not on file  . Ran Out of Food in the Last Year: Not on file  Transportation Needs:   . Lack of Transportation (Medical): Not on file  . Lack of Transportation (Non-Medical): Not on file  Physical Activity:   . Days of Exercise per Week: Not on file  . Minutes of Exercise per Session: Not on file  Stress:   . Feeling of Stress : Not on file  Social Connections:   . Frequency of Communication with Friends and Family: Not on file  . Frequency of Social Gatherings with Friends and Family: Not on file  . Attends Religious Services: Not on file  . Active Member of Clubs or Organizations: Not on file  . Attends Archivist Meetings: Not on file  . Marital Status: Not on file     Family History: The patient's family history includes Alzheimer's disease in his mother; Arthritis in his brother and sister;  Coronary artery disease in some other family members; Heart disease in his brother, father, mother, and sister; Migraines in his daughter and father; Obesity in his sister and son; Prostate cancer in his brother; Sleep apnea in his son; Thyroid disease in his daughter; Ulcers in his father.   ROS:   Please see the history of present illness.    All other systems reviewed and are negative.   EKGs/Labs/Other Studies Reviewed:    The following studies were reviewed today:  Echo 08/08/19  1. Left ventricular ejection fraction, by estimation, is 60 to 65%. The  left ventricle has normal function. The left ventricle has no regional  wall motion abnormalities. Left ventricular diastolic parameters are  consistent with Grade I diastolic  dysfunction (impaired relaxation).  2. Right ventricular systolic function is normal. The right ventricular  size is normal. There is normal pulmonary artery systolic pressure.  3. The mitral valve is normal in structure. No evidence of mitral valve  regurgitation. No evidence of mitral stenosis.  4. The aortic valve is normal in structure. Aortic valve regurgitation is  not visualized. Mild to moderate aortic valve sclerosis/calcification is  present, without any evidence of aortic stenosis.  5. The inferior vena cava is normal in size with greater than 50%  respiratory variability, suggesting right atrial pressure of 3 mmHg.   ABIs 08/09/2019 Summary:  Right: Resting right ankle-brachial index indicates noncompressible right  lower extremity arteries. The right toe-brachial index is normal. TBIs are  unreliable. ABIs are unreliable.   Vessels appear calcified with normal waveforms.  Left: Resting left ankle-brachial index indicates noncompressible left  lower extremity arteries. The left toe-brachial index is normal. TBIs are  unreliable. ABIs are unreliable.   EKG:  EKG is not ordered today.    Recent Labs: 08/09/2019: Magnesium 2.2 10/08/2019:  Brain Natriuretic Peptide 14 10/11/2019: ALT 3; BUN 27; Creatinine 1.69; Hemoglobin 9.6; Platelet Count 154; Potassium 3.6; Sodium 138  Recent Lipid Panel    Component Value Date/Time   CHOL 166  03/22/2019 0747   CHOL 135 09/15/2017 0953   TRIG 373.0 (H) 03/22/2019 0747   HDL 29.80 (L) 03/22/2019 0747   HDL 37 (L) 09/15/2017 0953   CHOLHDL 6 03/22/2019 0747   VLDL 74.6 (H) 03/22/2019 0747   LDLCALC 53 09/15/2017 0953   LDLDIRECT 70.0 03/22/2019 0747    Physical Exam:    VS:  BP 124/68   Pulse 86   Ht 5\' 9"  (1.753 m)   Wt 234 lb (106.1 kg)   SpO2 95%   BMI 34.56 kg/m     Wt Readings from Last 3 Encounters:  10/19/19 234 lb (106.1 kg)  10/11/19 233 lb 0.6 oz (105.7 kg)  10/08/19 241 lb (109.3 kg)     GEN:  Well nourished, well developed in no acute distress HEENT: Normal NECK: No JVD; No carotid bruits LYMPHATICS: No lymphadenopathy CARDIAC: RRR, no murmurs, rubs, gallops, 2+ edema, RLE wrap  RESPIRATORY:  Clear to auscultation without rales, wheezing or rhonchi  ABDOMEN: Soft, non-tender, non-distended MUSCULOSKELETAL:  No deformity  SKIN: Warm and dry NEUROLOGIC:  Alert and oriented x 3 PSYCHIATRIC:  Normal affect   ASSESSMENT AND PLAN:    1. Lower extremity edema Ongoing issue for at least 3 to 4 months.  ABI noncompressible August 2021.  Symptoms likely due to venous insufficiency.  His edema was improved with significant weight loss while taking higher dose of Lasix.  Now with gradually climbing up.  I have recommended leg elevation, compression stocking. further reduction in salt and water intake.  Increase Lasix to 60 mg daily (likely he will need higher maintenance dose greater than 40 mg daily).  If no improvement or recurrent issue may consider lymphedema clinic evaluation.  2.  Chronic kidney disease stage III -Renal function stable on recent check  3.  Paroxysmal atrial fibrillation -No bleeding issue.  Compliant with warfarin.  4.  SVT -No recurrent  episode -Continue beta-blocker.  5. CAD - No angina. Continue current therapy.   6. HTN BP stable on current medications.   Medication Adjustments/Labs and Tests Ordered: Current medicines are reviewed at length with the patient today.  Concerns regarding medicines are outlined above.  No orders of the defined types were placed in this encounter.  Meds ordered this encounter  Medications  . furosemide (LASIX) 40 MG tablet    Sig: Take 1.5 tablets (60 mg total) by mouth daily.    Dispense:  135 tablet    Refill:  1    Patient Instructions  Medication Instructions:  Your physician has recommended you make the following change in your medication:  1.  INCREASE the Lasix to 40 mg taking 1 1/2 tablet daily  *If you need a refill on your cardiac medications before your next appointment, please call your pharmacy*   Lab Work: None ordered  If you have labs (blood work) drawn today and your tests are completely normal, you will receive your results only by: Marland Kitchen MyChart Message (if you have MyChart) OR . A paper copy in the mail If you have any lab test that is abnormal or we need to change your treatment, we will call you to review the results.   Testing/Procedures: None ordered   Follow-Up: At [facility], you and your health needs are our priority.  As part of our continuing mission to provide you with exceptional heart care, we have created designated Provider Care Teams.  These Care Teams include your primary Cardiologist (physician) and Advanced Practice Providers (APPs -  Physician Assistants and Nurse Practitioners) who all work together to provide you with the care you need, when you need it.  We recommend signing up for the patient portal called "MyChart".  Sign up information is provided on this After Visit Summary.  MyChart is used to connect with patients for Virtual Visits (Telemedicine).  Patients are able to view lab/test results, encounter notes, upcoming  appointments, etc.  Non-urgent messages can be sent to your provider as well.   To learn more about what you can do with MyChart, go to [email or web address].    Your next appointment:   3 month(s)  The format for your next appointment:   In Person  Provider:   Lauree Chandler, MD   Other Instructions      Signed, Leanor Kail, PA  10/19/2019 10:59 AM    [place]

## 2019-10-19 NOTE — Patient Instructions (Signed)
Medication Instructions:  Your physician has recommended you make the following change in your medication:  1.  INCREASE the Lasix to 40 mg taking 1 1/2 tablet daily  *If you need a refill on your cardiac medications before your next appointment, please call your pharmacy*   Lab Work: None ordered  If you have labs (blood work) drawn today and your tests are completely normal, you will receive your results only by:  [place] (if you have MyChart) OR  A paper copy in the mail If you have any lab test that is abnormal or we need to change your treatment, we will call you to review the results.   Testing/Procedures: None ordered   Follow-Up: At [facility], you and your health needs are our priority.  As part of our continuing mission to provide you with exceptional heart care, we have created designated Provider Care Teams.  These Care Teams include your primary Cardiologist (physician) and Advanced Practice Providers (APPs -  Physician Assistants and Nurse Practitioners) who all work together to provide you with the care you need, when you need it.  We recommend signing up for the patient portal called "MyChart".  Sign up information is provided on this After Visit Summary.  MyChart is used to connect with patients for Virtual Visits (Telemedicine).  Patients are able to view lab/test results, encounter notes, upcoming appointments, etc.  Non-urgent messages can be sent to your provider as well.   To learn more about what you can do with MyChart, go to [email or web address].    Your next appointment:   3 month(s)  The format for your next appointment:   In Person  Provider:   Lauree Chandler, MD   Other Instructions

## 2019-10-21 ENCOUNTER — Telehealth: Payer: Self-pay | Admitting: Family Medicine

## 2019-10-21 NOTE — Telephone Encounter (Signed)
Gary Yates from [facility] called to see if there is an order for them to go to the patients house to wrap both o his legs for wound care.  The patient patient told them that his doctor told him to call [facility] and tell them that they need to come wrap both of his legs.  Please advise

## 2019-10-22 ENCOUNTER — Telehealth: Payer: Self-pay

## 2019-10-22 NOTE — Telephone Encounter (Signed)
Left a message for Lexine Baton to return my call.

## 2019-10-22 NOTE — Telephone Encounter (Signed)
OK to proceed with order as above.

## 2019-10-22 NOTE — Telephone Encounter (Signed)
Pt wound care stopped due to wound healing, forms signed by doctor faxed to [facility] [phone]

## 2019-10-25 ENCOUNTER — Encounter: Payer: Self-pay | Admitting: Family Medicine

## 2019-10-25 ENCOUNTER — Ambulatory Visit (INDEPENDENT_AMBULATORY_CARE_PROVIDER_SITE_OTHER): Payer: Medicare Other | Admitting: Family Medicine

## 2019-10-25 ENCOUNTER — Telehealth: Payer: Self-pay

## 2019-10-25 ENCOUNTER — Other Ambulatory Visit: Payer: Self-pay

## 2019-10-25 VITALS — BP 124/80 | HR 58 | Ht 69.0 in | Wt 233.3 lb

## 2019-10-25 DIAGNOSIS — E1140 Type 2 diabetes mellitus with diabetic neuropathy, unspecified: Secondary | ICD-10-CM | POA: Diagnosis not present

## 2019-10-25 DIAGNOSIS — R600 Localized edema: Secondary | ICD-10-CM | POA: Diagnosis not present

## 2019-10-25 DIAGNOSIS — Z794 Long term (current) use of insulin: Secondary | ICD-10-CM

## 2019-10-25 NOTE — Progress Notes (Signed)
Established Patient Office Visit  Subjective:  Patient ID: Gary Yates., male    DOB: 25-May-1933  Age: 86 y.o. MRN: [id number]  CC:  Chief Complaint  Patient presents with  . Follow-up    HPI [name]. presents for follow-up regarding bilateral leg edema.  Refer to previous notes.  He was seen here on 1 October and weight was 241 pounds.  We increased his Lasix to 80 mg daily at that time for 5 days then dropped back to 60 mg.  Weight is 233 today.  Still has some edema.  He has been getting compression wrap left lower extremity secondary to some weeping edema.  No ulcerations.  He has home health coming out to apply those.  He did scale back gabapentin to 200 mg twice daily.  Is having some breakthrough neuropathy pains.  Has chronic kidney disease and chronic anemia.  Recent  erythropoietin injection.  Type 2 diabetes well controlled with recent A1C 6.6%.   Past Medical History:  Diagnosis Date  . Anemia in chronic renal disease 05/07/2017  . Anxiety   . Atrial fibrillation ([name])   . COPD (chronic obstructive pulmonary disease) ([place])    pt. denies  . Coronary artery disease    a. h/o Overlapping stents RCA;  b. 06/2011 Cath: patent stents, nonobs dzs, NL EF.  . Diabetic peripheral neuropathy ([name])   . Diffuse non-Hodgkin's lymphoma of testis ([name]) 09/28/2015  . DM (diabetes mellitus) ([name])    Type 2, peripheral neuropathy.  . Dyspnea    with exertion  . Dysrhythmia   . GERD (gastroesophageal reflux disease)   . Headache   . History of bronchitis   . History of kidney stones   . History of radiation therapy 02/19/16 - 03/13/16   Testis/Scrotum: 32.4 Gy in 18 fractions  . History of radiation therapy 08/07/16-08/20/16   left adrenal gland mass treated to 30 Gy in 10 fractions  . Hyperlipidemia   . Hypertension   . Iron deficiency anemia due to chronic blood loss 08/08/2017  . Low testosterone   . Nephrolithiasis   . OSA (obstructive sleep apnea) 11/26/2017  .  Osteoarthritis    shoulder  . Restless leg   . SVT (supraventricular tachycardia) ([name])   . Urinary frequency   . Wears partial dentures    upper and lower    Past Surgical History:  Procedure Laterality Date  . APPENDECTOMY    . [place]  . CARDIAC CATHETERIZATION  01/2013  . CATARACT EXTRACTION, BILATERAL    . CHOLECYSTECTOMY    . COLONOSCOPY    . CORONARY ANGIOPLASTY  2004  . CYSTOSCOPY N/A 08/18/2017   Procedure: CYSTOSCOPY WITH FULGURATION AND SUPRA PUBIC TUBE PLACEMENT;  Surgeon: Kathie Rhodes, MD;  Location: [facility];  Service: Urology;  Laterality: N/A;  . EYE SURGERY Bilateral    cataracts  . IR GENERIC HISTORICAL  10/05/2015   IR US GUIDE VASC ACCESS RIGHT 10/05/2015 Marybelle Killings, MD [facility]  . IR GENERIC HISTORICAL  10/05/2015   IR FLUORO GUIDE PORT INSERTION RIGHT 10/05/2015 Marybelle Killings, MD [facility]  . LEFT HEART CATHETERIZATION WITH CORONARY ANGIOGRAM N/A 06/18/2011   Procedure: LEFT HEART CATHETERIZATION WITH CORONARY ANGIOGRAM;  Surgeon: Peter M Martinique, MD;  Location[facility] CATH LAB;  Service: Cardiovascular;  Laterality: N/A;  . LEFT HEART CATHETERIZATION WITH CORONARY ANGIOGRAM N/A 01/27/2013   Procedure: LEFT HEART CATHETERIZATION WITH CORONARY ANGIOGRAM;  Surgeon: Burnell Blanks,  MD;  Location: [facility] CATH LAB;  Service: Cardiovascular;  Laterality: N/A;  . LUMBAR LAMINECTOMY/DECOMPRESSION MICRODISCECTOMY N/A 02/07/2015   Procedure: Lumbar three-Sacral one Decompression;  Surgeon: Kevan Ny Ditty, MD;  Location: [facility] NEURO ORS;  Service: Neurosurgery;  Laterality: N/A;  L3 to S1 Decompression  . MULTIPLE TOOTH EXTRACTIONS    . ORCHIECTOMY Right 09/01/2015   Procedure: RIGHT ORCHIECTOMY;  Surgeon: Kathie Rhodes, MD;  Location: [facility];  Service: Urology;  Laterality: Right;  . port a cath in place     . ROTATOR CUFF REPAIR Left     Family History  Problem Relation Age of Onset  . Alzheimer's disease Mother   . Heart disease Mother   .  Heart disease Father   . Migraines Father   . Ulcers Father   . Prostate cancer Brother   . Coronary artery disease Other        Male 1st degree relative <50  . Coronary artery disease Other        male 1st degree relative <60  . Heart disease Sister   . Obesity Sister        Morbid  . Arthritis Sister   . Heart disease Brother   . Arthritis Brother   . Sleep apnea Son   . Obesity Son   . Migraines Daughter   . Thyroid disease Daughter     Social History   Socioeconomic History  . Marital status: Married    Spouse name: Not on file  . Number of children: 3  . Years of education: Not on file  . Highest education level: Not on file  Occupational History  . Occupation: Retired    Fish farm manager: RETIRED  Tobacco Use  . Smoking status: Former Smoker    Packs/day: 1.50    Years: 20.00    Pack years: 30.00    Types: Cigarettes    Quit date: 04/05/1978    Years since quitting: 41.5  . Smokeless tobacco: Never Used  Vaping Use  . Vaping Use: Never used  Substance and Sexual Activity  . Alcohol use: No  . Drug use: No  . Sexual activity: Yes    Birth control/protection: None    Comment: Married  Other Topics Concern  . Not on file  Social History Narrative  . Not on file   Social Determinants of Health   Financial Resource Strain:   . Difficulty of Paying Living Expenses: Not on file  Food Insecurity:   . Worried About Charity fundraiser in the Last Year: Not on file  . Ran Out of Food in the Last Year: Not on file  Transportation Needs:   . Lack of Transportation (Medical): Not on file  . Lack of Transportation (Non-Medical): Not on file  Physical Activity:   . Days of Exercise per Week: Not on file  . Minutes of Exercise per Session: Not on file  Stress:   . Feeling of Stress : Not on file  Social Connections:   . Frequency of Communication with Friends and Family: Not on file  . Frequency of Social Gatherings with Friends and Family: Not on file  . Attends  Religious Services: Not on file  . Active Member of Clubs or Organizations: Not on file  . Attends Archivist Meetings: Not on file  . Marital Status: Not on file  Intimate Partner Violence:   . Fear of Current or Ex-Partner: Not on file  . Emotionally Abused: Not on file  .  Physically Abused: Not on file  . Sexually Abused: Not on file    Outpatient Medications Prior to Visit  Medication Sig Dispense Refill  . acetaminophen (TYLENOL) 500 MG tablet Take 1,000 mg by mouth every 6 (six) hours as needed (for pain/fever/headaches.).     Marland Kitchen atorvastatin (LIPITOR) 40 MG tablet TAKE 1 TABLET BY MOUTH EVERY DAY 90 tablet 1  . furosemide (LASIX) 40 MG tablet Take 1.5 tablets (60 mg total) by mouth daily. 135 tablet 1  . gabapentin (NEURONTIN) 300 MG capsule Take 600 mg by mouth 2 (two) times daily.    Marland Kitchen METFORMIN HCL PO Take 500 mg by mouth. 1 tablet by mouth twice daily with meal    . metoprolol succinate (TOPROL-XL) 25 MG 24 hr tablet Take 1 tablet (25 mg total) by mouth daily. 30 tablet 0  . nystatin (MYCOSTATIN/NYSTOP) powder APPLY TO AFFECTED AREA 4 TIMES A DAY 60 g 0  . pantoprazole (PROTONIX) 40 MG tablet Take 40 mg by mouth daily as needed (heartburn).    . potassium chloride SA (KLOR-CON M20) 20 MEQ tablet Take 1 tablet (20 mEq total) by mouth daily. 30 tablet 0  . pramipexole (MIRAPEX) 1.5 MG tablet Take 2 tablets (3 mg total) by mouth every evening. 60 tablet 0  . warfarin (COUMADIN) 5 MG tablet Take 1 tablet (5 mg total) by mouth daily. 30 tablet 0  . docusate sodium (COLACE) 50 MG capsule Take 1 capsule (50 mg total) by mouth 2 (two) times daily as needed (for constipation). (Patient not taking: Reported on 10/25/2019) 90 capsule 0  . nitroGLYCERIN (NITROSTAT) 0.4 MG SL tablet Place 1 tablet (0.4 mg total) under the tongue every 5 (five) minutes as needed. Chest pain (Patient not taking: Reported on 10/25/2019) 25 tablet 6  . nystatin-triamcinolone ointment (MYCOLOG) Apply 1  application topically 2 (two) times daily. (Patient not taking: Reported on 10/25/2019) 60 g 0  . polyethylene glycol (MIRALAX / GLYCOLAX) 17 g packet Take 17 g by mouth as needed. (Patient not taking: Reported on 10/25/2019)     No facility-administered medications prior to visit.    Allergies  Allergen Reactions  . Latex Other (See Comments)    Reddens the skin  . Ace Inhibitors Cough  . Codeine Nausea Only and Rash       . Penicillins Rash    Childhood allergy Has patient had a PCN reaction causing immediate rash, facial/tongue/throat swelling, SOB or lightheadedness with hypotension: Yes Has patient had a PCN reaction causing severe rash involving mucus membranes or skin necrosis: Yes Has patient had a PCN reaction that required hospitalization No Has patient had a PCN reaction occurring within the last 10 years: No If all of the above answers are "NO", then may proceed with Cephalosporin use.     ROS Review of Systems    Objective:    Physical Exam  BP 124/80 (BP Location: Left Arm, Patient Position: Sitting, Cuff Size: Normal)   Pulse (!) 58   Ht 5' 9" (1.753 m)   Wt 233 lb 4.8 oz (105.8 kg)   BMI 34.45 kg/m  Wt Readings from Last 3 Encounters:  10/25/19 233 lb 4.8 oz (105.8 kg)  10/19/19 234 lb (106.1 kg)  10/11/19 233 lb 0.6 oz (105.7 kg)     Health Maintenance Due  Topic Date Due  . FOOT EXAM  08/06/2014  . OPHTHALMOLOGY EXAM  07/23/2019  . INFLUENZA VACCINE  08/08/2019    There are no preventive care reminders  to display for this patient.  Lab Results  Component Value Date   TSH 4.795 (H) 08/05/2018   Lab Results  Component Value Date   WBC 7.2 10/11/2019   HGB 9.6 (L) 10/11/2019   HCT 29.4 (L) 10/11/2019   MCV 97.0 10/11/2019   PLT 154 10/11/2019   Lab Results  Component Value Date   NA 138 10/11/2019   K 3.6 10/11/2019   CHLORIDE 104 12/14/2015   CO2 27 10/11/2019   GLUCOSE 119 (H) 10/11/2019   BUN 27 (H) 10/11/2019   CREATININE  1.69 (H) 10/11/2019   BILITOT 0.5 10/11/2019   ALKPHOS 77 10/11/2019   AST 12 (L) 10/11/2019   ALT 3 10/11/2019   PROT 7.1 10/11/2019   ALBUMIN 3.9 10/11/2019   CALCIUM 9.5 10/11/2019   ANIONGAP 11 10/11/2019   EGFR 76 (L) 12/14/2015   GFR 38.06 (L) 03/22/2019   Lab Results  Component Value Date   CHOL 166 03/22/2019   Lab Results  Component Value Date   HDL 29.80 (L) 03/22/2019   Lab Results  Component Value Date   LDLCALC 53 09/15/2017   Lab Results  Component Value Date   TRIG 373.0 (H) 03/22/2019   Lab Results  Component Value Date   CHOLHDL 6 03/22/2019   Lab Results  Component Value Date   HGBA1C 6.6 (H) 10/11/2019      Assessment & Plan:   #1 bilateral leg edema.  Recent BNP level normal.  Suspect multifactorial.  Still appears to be volume overloaded and significant leg edema right lower extremity.  Left lower extremity is wrapped in compression wrap  -Increase Lasix further to 80 mg daily for 5 days and drop back to 60 mg daily.  Continue daily weights.  Recent electrolytes stable -applied compression wrap R leg with gauze and coban wrap.   #2 type 2 diabetes stable with recent A1c 6.6%.  Continue current medications  No orders of the defined types were placed in this encounter.   Follow-up: Return in about 1 month (around 11/25/2019).    Carolann Littler, MD

## 2019-10-25 NOTE — Telephone Encounter (Signed)
Called and spoke home health [facility] , (Lauren) per Dr Maylene Roes with need to have both legs wrap for swelling, gave verbal order to [place], pt is schedule to have this done on Tuesday 10/26/19. Pt is aware of this appointment.

## 2019-10-25 NOTE — Patient Instructions (Addendum)
Increase the Furosemide back up to 80 mg daily for 4-5 days and then drop back to 60 mg daily.  Continue with daily weights  Elevate legs as much as possible!

## 2019-10-27 ENCOUNTER — Other Ambulatory Visit: Payer: Self-pay | Admitting: Family Medicine

## 2019-10-27 NOTE — Telephone Encounter (Signed)
Refills OK. 

## 2019-10-28 ENCOUNTER — Ambulatory Visit (INDEPENDENT_AMBULATORY_CARE_PROVIDER_SITE_OTHER): Payer: Medicare Other | Admitting: General Practice

## 2019-10-28 DIAGNOSIS — I480 Paroxysmal atrial fibrillation: Secondary | ICD-10-CM | POA: Diagnosis not present

## 2019-10-28 LAB — POCT INR: INR: 1.8 — AB (ref 2.0–3.0)

## 2019-10-28 NOTE — Progress Notes (Signed)
Medical screening examination/treatment/procedure(s) were performed by non-physician practitioner and as supervising physician I was immediately available for consultation/collaboration. I agree with above. Psalm Arman, MD   

## 2019-10-28 NOTE — Patient Instructions (Signed)
Pre visit review using our clinic review tool, if applicable. No additional management support is needed unless otherwise documented below in the visit note.  Take 1 1/2 tablets today and then Continue to take  5 mg daily except 2.5 mg on Mon, Wed and Fridays and Saturdays.  Re-check in 2 weeks.  Dosing instruction given to Olivia Mackie, RN @ [facility].  [phone].

## 2019-11-03 ENCOUNTER — Telehealth: Payer: Self-pay | Admitting: Family Medicine

## 2019-11-03 ENCOUNTER — Other Ambulatory Visit: Payer: Self-pay | Admitting: Adult Health

## 2019-11-03 MED ORDER — MAGIC MOUTHWASH W/LIDOCAINE: 5.0000 mL | Freq: Three times a day (TID) | ORAL | 0 refills | Status: DC | PRN

## 2019-11-03 NOTE — Telephone Encounter (Signed)
The patient is needing some magic mouth wash called into the pharmacy  [id number] [name], [place] - [address] Phone:  [phone]  Fax:  [phone]      [name] filled this Rx a few years ago and now Gary Yates has cancer and his partials are sliding back and fourth causing mouth sores. Gary Yates's going to the dentist next week but needs something to help in the mean time.  Please advise

## 2019-11-09 ENCOUNTER — Ambulatory Visit (INDEPENDENT_AMBULATORY_CARE_PROVIDER_SITE_OTHER): Payer: Medicare Other | Admitting: General Practice

## 2019-11-09 DIAGNOSIS — Z7901 Long term (current) use of anticoagulants: Secondary | ICD-10-CM | POA: Diagnosis not present

## 2019-11-09 LAB — POCT INR: INR: 1.4 — AB (ref 2.0–3.0)

## 2019-11-09 NOTE — Patient Instructions (Signed)
Pre visit review using our clinic review tool, if applicable. No additional management support is needed unless otherwise documented below in the visit note.  Take 1 1/2 tablets today and then take 1 tablet tomorrow.  On Thursday change dosage and take  2.5 mg on Mon, Fridays and Saturdays and take 1 tablet (5 mg) on Sun Tues Wed Thurs.   Re-check in 1 week.   Dosing instruction given to Kathlee Nations, RN @ [facility].

## 2019-11-09 NOTE — Progress Notes (Signed)
Medical screening examination/treatment/procedure(s) were performed by non-physician practitioner and as supervising physician I was immediately available for consultation/collaboration. I agree with above. Bryton Waight, MD   

## 2019-11-11 ENCOUNTER — Encounter: Payer: Self-pay | Admitting: Family Medicine

## 2019-11-12 ENCOUNTER — Other Ambulatory Visit: Payer: Self-pay

## 2019-11-12 ENCOUNTER — Ambulatory Visit (INDEPENDENT_AMBULATORY_CARE_PROVIDER_SITE_OTHER): Payer: Medicare Other

## 2019-11-12 DIAGNOSIS — Z0000 Encounter for general adult medical examination without abnormal findings: Secondary | ICD-10-CM | POA: Diagnosis not present

## 2019-11-12 NOTE — Progress Notes (Signed)
Subjective:   Gary Yates. is a 86 y.o. male who presents for an Initial Medicare Annual Wellness Visit.  I connected with Assad Harbeson today by telephone and verified that I am speaking with the correct person using two identifiers. Location patient: home Location provider: work Persons participating in the virtual visit: patient, provider.   I discussed the limitations, risks, security and privacy concerns of performing an evaluation and management service by telephone and the availability of in person appointments. I also discussed with the patient that there may be a patient responsible charge related to this service. The patient expressed understanding and verbally consented to this telephonic visit.    Interactive audio and video telecommunications were attempted between this provider and patient, however failed, due to patient having technical difficulties OR patient did not have access to video capability.  We continued and completed visit with audio only.      Review of Systems    N/A  Cardiac Risk Factors include: advanced age (>25men, >93 women);male gender;diabetes mellitus;hypertension;dyslipidemia     Objective:    Today's Vitals   There is no height or weight on file to calculate BMI.  Advanced Directives 11/12/2019 10/11/2019 08/23/2019 08/13/2019 08/07/2019 04/14/2019 12/09/2018  Does Patient Have a Medical Advance Directive? Yes Yes Yes Yes Yes Yes Yes  Type of Paramedic of [place];Living will [place];Living will - - - [place];Living will [place];Living will  Does patient want to make changes to medical advance directive? No - Patient declined - No - Patient declined No - Patient declined - - -  Copy of [place] in Chart? No - copy requested - - - - - -  Would patient like information on creating a medical advance directive? - - - - - - -  Pre-existing out of  facility DNR order (yellow form or pink MOST form) - - - - - - -    Current Medications (verified) Outpatient Encounter Medications as of 11/12/2019  Medication Sig  . acetaminophen (TYLENOL) 500 MG tablet Take 1,000 mg by mouth every 6 (six) hours as needed (for pain/fever/headaches.).   Marland Kitchen docusate sodium (COLACE) 50 MG capsule Take 1 capsule (50 mg total) by mouth 2 (two) times daily as needed (for constipation).  . furosemide (LASIX) 40 MG tablet Take 1.5 tablets (60 mg total) by mouth daily.  Marland Kitchen gabapentin (NEURONTIN) 300 MG capsule Take 600 mg by mouth 2 (two) times daily.  . magic mouthwash w/lidocaine SOLN Take 5 mLs by mouth 3 (three) times daily as needed.  Marland Kitchen METFORMIN HCL PO Take 500 mg by mouth. 1 tablet by mouth twice daily with meal  . metoprolol succinate (TOPROL-XL) 25 MG 24 hr tablet Take 1 tablet (25 mg total) by mouth daily.  Marland Kitchen nystatin (MYCOSTATIN/NYSTOP) powder APPLY TO AFFECTED AREA 4 TIMES A DAY  . nystatin-triamcinolone ointment (MYCOLOG) Apply 1 application topically 2 (two) times daily.  . pantoprazole (PROTONIX) 40 MG tablet Take 40 mg by mouth daily as needed (heartburn).  . polyethylene glycol (MIRALAX / GLYCOLAX) 17 g packet Take 17 g by mouth as needed.   . pramipexole (MIRAPEX) 1.5 MG tablet TAKE 2 TABLETS BY MOUTH EVERY EVENING  . warfarin (COUMADIN) 5 MG tablet Take 1 tablet (5 mg total) by mouth daily.  Marland Kitchen atorvastatin (LIPITOR) 40 MG tablet TAKE 1 TABLET BY MOUTH EVERY DAY (Patient not taking: Reported on 11/12/2019)  . nitroGLYCERIN (NITROSTAT)  0.4 MG SL tablet Place 1 tablet (0.4 mg total) under the tongue every 5 (five) minutes as needed. Chest pain (Patient not taking: Reported on 10/25/2019)  . potassium chloride SA (KLOR-CON M20) 20 MEQ tablet Take 1 tablet (20 mEq total) by mouth daily.   No facility-administered encounter medications on file as of 11/12/2019.    Allergies (verified) Latex, Ace inhibitors, Codeine, and Penicillins   History: Past  Medical History:  Diagnosis Date  . Anemia in chronic renal disease 05/07/2017  . Anxiety   . Atrial fibrillation ([place])   . COPD (chronic obstructive pulmonary disease) ([place])    pt. denies  . Coronary artery disease    a. h/o Overlapping stents RCA;  b. 06/2011 Cath: patent stents, nonobs dzs, NL EF.  . Diabetic peripheral neuropathy ([place])   . Diffuse non-Hodgkin's lymphoma of testis ([place]) 09/28/2015  . DM (diabetes mellitus) ([place])    Type 2, peripheral neuropathy.  . Dyspnea    with exertion  . Dysrhythmia   . GERD (gastroesophageal reflux disease)   . Headache   . History of bronchitis   . History of kidney stones   . History of radiation therapy 02/19/16 - 03/13/16   Testis/Scrotum: 32.4 Gy in 18 fractions  . History of radiation therapy 08/07/16-08/20/16   left adrenal gland mass treated to 30 Gy in 10 fractions  . Hyperlipidemia   . Hypertension   . Iron deficiency anemia due to chronic blood loss 08/08/2017  . Low testosterone   . Nephrolithiasis   . OSA (obstructive sleep apnea) 11/26/2017  . Osteoarthritis    shoulder  . Restless leg   . SVT (supraventricular tachycardia) ([place])   . Urinary frequency   . Wears partial dentures    upper and lower   Past Surgical History:  Procedure Laterality Date  . APPENDECTOMY    . [place]  . CARDIAC CATHETERIZATION  01/2013  . CATARACT EXTRACTION, BILATERAL    . CHOLECYSTECTOMY    . COLONOSCOPY    . CORONARY ANGIOPLASTY  2004  . CYSTOSCOPY N/A 08/18/2017   Procedure: CYSTOSCOPY WITH FULGURATION AND SUPRA PUBIC TUBE PLACEMENT;  Surgeon: Kathie Rhodes, MD;  Location: [facility];  Service: Urology;  Laterality: N/A;  . EYE SURGERY Bilateral    cataracts  . IR GENERIC HISTORICAL  10/05/2015   IR US GUIDE VASC ACCESS RIGHT 10/05/2015 Marybelle Killings, MD [facility]  . IR GENERIC HISTORICAL  10/05/2015   IR FLUORO GUIDE PORT INSERTION RIGHT 10/05/2015 Marybelle Killings, MD [facility]  . LEFT HEART CATHETERIZATION WITH CORONARY  ANGIOGRAM N/A 06/18/2011   Procedure: LEFT HEART CATHETERIZATION WITH CORONARY ANGIOGRAM;  Surgeon: Peter M Martinique, MD;  [facility] LAB;  Service: Cardiovascular;  Laterality: N/A;  . LEFT HEART CATHETERIZATION WITH CORONARY ANGIOGRAM N/A 01/27/2013   Procedure: LEFT HEART CATHETERIZATION WITH CORONARY ANGIOGRAM;  Surgeon: Burnell Blanks, MD;  Location: [facility] CATH LAB;  Service: Cardiovascular;  Laterality: N/A;  . LUMBAR LAMINECTOMY/DECOMPRESSION MICRODISCECTOMY N/A 02/07/2015   Procedure: Lumbar three-Sacral one Decompression;  Surgeon: Kevan Ny Ditty, MD;  Location: [facility] NEURO ORS;  Service: Neurosurgery;  Laterality: N/A;  L3 to S1 Decompression  . MULTIPLE TOOTH EXTRACTIONS    . ORCHIECTOMY Right 09/01/2015   Procedure: RIGHT ORCHIECTOMY;  Surgeon: Kathie Rhodes, MD;  Location: [facility];  Service: Urology;  Laterality: Right;  . port a cath in place     . ROTATOR CUFF REPAIR Left    Family History  Problem Relation Age of Onset  . Alzheimer's disease Mother   . Heart disease Mother   . Heart disease Father   . Migraines Father   . Ulcers Father   . Prostate cancer Brother   . Coronary artery disease Other        Male 1st degree relative <50  . Coronary artery disease Other        male 1st degree relative <60  . Heart disease Sister   . Obesity Sister        Morbid  . Arthritis Sister   . Heart disease Brother   . Arthritis Brother   . Sleep apnea Son   . Obesity Son   . Migraines Daughter   . Thyroid disease Daughter    Social History   Socioeconomic History  . Marital status: Married    Spouse name: Not on file  . Number of children: 3  . Years of education: Not on file  . Highest education level: Not on file  Occupational History  . Occupation: Retired    Fish farm manager: RETIRED  Tobacco Use  . Smoking status: Former Smoker    Packs/day: 1.50    Years: 20.00    Pack years: 30.00    Types: Cigarettes    Quit date: 04/05/1978    Years since quitting: 41.6   . Smokeless tobacco: Never Used  Vaping Use  . Vaping Use: Never used  Substance and Sexual Activity  . Alcohol use: No  . Drug use: No  . Sexual activity: Yes    Birth control/protection: None    Comment: Married  Other Topics Concern  . Not on file  Social History Narrative  . Not on file   Social Determinants of Health   Financial Resource Strain: Low Risk   . Difficulty of Paying Living Expenses: Not hard at all  Food Insecurity: No Food Insecurity  . Worried About Charity fundraiser in the Last Year: Never true  . Ran Out of Food in the Last Year: Never true  Transportation Needs: No Transportation Needs  . Lack of Transportation (Medical): No  . Lack of Transportation (Non-Medical): No  Physical Activity: Insufficiently Active  . Days of Exercise per Week: 2 days  . Minutes of Exercise per Session: 30 min  Stress: No Stress Concern Present  . Feeling of Stress : Not at all  Social Connections: Moderately Integrated  . Frequency of Communication with Friends and Family: More than three times a week  . Frequency of Social Gatherings with Friends and Family: More than three times a week  . Attends Religious Services: More than 4 times per year  . Active Member of Clubs or Organizations: No  . Attends Archivist Meetings: Never  . Marital Status: Married    Tobacco Counseling Counseling given: Not Answered   Clinical Intake:  Pre-visit preparation completed: Yes  Pain : No/denies pain     Nutritional Risks: None  How often do you need to have someone help you when you read instructions, pamphlets, or other written materials from your doctor or pharmacy?: 1 - Never What is the last grade level you completed in school?: College  Diabetic?yes Nutrition Risk Assessment:  Has the patient had any N/V/D within the last 2 months?  No  Does the patient have any non-healing wounds?  No  Has the patient had any unintentional weight loss or weight gain?   No   Diabetes:  Is the patient diabetic?  Yes  If diabetic, was a CBG obtained today?  No  Did the patient bring in their glucometer from home?  No  How often do you monitor your CBG's? Patient states checks glucose once per day, sometimes twice a day if fasting blood sugars are elevated.   Financial Strains and Diabetes Management:  Are you having any financial strains with the device, your supplies or your medication? No .  Does the patient want to be seen by Chronic Care Management for management of their diabetes?  No  Would the patient like to be referred to a Nutritionist or for Diabetic Management?  No   Diabetic Exams:  Diabetic Eye Exam: Overdue for diabetic eye exam. Pt has been advised about the importance in completing this exam. Patient advised to call and schedule an eye exam. Diabetic Foot Exam: Overdue, Pt has been advised about the importance in completing this exam. Pt is scheduled for diabetic foot exam on 11/29/2019.   Interpreter Needed?: No  Information entered by :: [place] of Daily Living In your present state of health, do you have any difficulty performing the following activities: 11/12/2019 08/07/2019  Hearing? N N  Vision? N N  Difficulty concentrating or making decisions? - N  Walking or climbing stairs? Y Y  Comment Patient states has some difficulties walking and climbing stairs -  Dressing or bathing? N N  Doing errands, shopping? Y N  Comment Currently due to neuropathy and swelling in legs -  Preparing Food and eating ? N -  Using the Toilet? N -  In the past six months, have you accidently leaked urine? N -  Comment Has a catheter -  Do you have problems with loss of bowel control? N -  Managing your Medications? N -  Managing your Finances? N -  Housekeeping or managing your Housekeeping? N -  Some recent data might be hidden    Patient Care Team: [name], MD as PCP - General (Family Medicine) Burnell Blanks, MD as PCP - Cardiology (Cardiology) Burnell Blanks, MD as Consulting Physician (Cardiology)  Indicate any recent Medical Services you may have received from other than [facility] providers in the past year (date may be approximate).     Assessment:   This is a routine wellness examination for [place].  Hearing/Vision screen  Hearing Screening   125Hz  250Hz  500Hz  1000Hz  2000Hz  3000Hz  4000Hz  6000Hz  8000Hz   Right ear:           Left ear:           Vision Screening Comments: Patient states gets eyes examined once per year   Dietary issues and exercise activities discussed: Current Exercise Habits: Structured exercise class, Type of exercise: strength training/weights;stretching, Time (Minutes): 30, Frequency (Times/Week): 2, Weekly Exercise (Minutes/Week): 60, Intensity: Mild  Goals    . Exercise 3x per week (30 min per time)      Depression Screen PHQ 2/9 Scores 11/12/2019 10/17/2016 07/31/2016 05/10/2015 03/17/2014 03/15/2013 09/21/2012  PHQ - 2 Score 0 0 0 0 0 0 1  PHQ- 9 Score 0 - - - - - -    Fall Risk Fall Risk  11/12/2019 07/31/2016 01/31/2016 10/26/2015 10/16/2015  Falls in the past year? 1 No Yes No No  Number falls in past yr: 1 - 1 - -  Injury with Fall? 0 - Yes - -  Risk for fall due to : Impaired balance/gait - - - -  Follow up Falls  evaluation completed;Falls prevention discussed - - - -    Any stairs in or around the home? No  If so, are there any without handrails? No  Home free of loose throw rugs in walkways, pet beds, electrical cords, etc? Yes  Adequate lighting in your home to reduce risk of falls? Yes   ASSISTIVE DEVICES UTILIZED TO PREVENT FALLS:  Life alert? No  Use of a cane, walker or w/c? Yes  Grab bars in the bathroom? Yes  Shower chair or bench in shower? Yes  Elevated toilet seat or a handicapped toilet? No    Cognitive Function:     6CIT Screen 11/12/2019  What Year? 0 points  What month? 0 points  What time? 0 points  Count  back from 20 0 points  Months in reverse 0 points  Repeat phrase 0 points  Total Score 0    Immunizations Immunization History  Administered Date(s) Administered  . Fluad Quad(high Dose 65+) 09/18/2018, 10/25/2019  . Influenza Split 10/03/2010, 10/10/2011  . Influenza Whole 11/09/2008, 09/19/2009  . Influenza, High Dose Seasonal PF 09/21/2012, 10/26/2014, 09/10/2016, 09/11/2017  . Influenza,inj,Quad PF,6+ Mos 11/04/2013  . Influenza-Unspecified 09/30/2015, 09/11/2017  . PFIZER SARS-COV-2 Vaccination 01/26/2019, 02/15/2019, 09/20/2019  . Pneumococcal Conjugate-13 08/04/2013  . Pneumococcal Polysaccharide-23 01/08/2003  . Td 05/16/2005  . Tdap 05/10/2015  . Zoster 08/04/2013  . Zoster Recombinat (Shingrix) 03/15/2017, 09/08/2017    TDAP status: Up to date Flu Vaccine status: Up to date Pneumococcal vaccine status: Up to date Covid-19 vaccine status: Completed vaccines  Qualifies for Shingles Vaccine? Yes   Zostavax completed Yes   Shingrix Completed?: Yes  Screening Tests Health Maintenance  Topic Date Due  . FOOT EXAM  08/06/2014  . OPHTHALMOLOGY EXAM  07/23/2019  . HEMOGLOBIN A1C  04/10/2020  . TETANUS/TDAP  05/09/2025  . INFLUENZA VACCINE  Completed  . COVID-19 Vaccine  Completed  . PNA vac Low Risk Adult  Completed    Health Maintenance  Health Maintenance Due  Topic Date Due  . FOOT EXAM  08/06/2014  . OPHTHALMOLOGY EXAM  07/23/2019    Colorectal cancer screening: No longer required.   Lung Cancer Screening: (Low Dose CT Chest recommended if Age 5-80 years, 30 pack-year currently smoking OR have quit w/in 15years.) does not qualify.   Lung Cancer Screening Referral: N/A   Additional Screening:  Hepatitis C Screening: does not qualify;  Vision Screening: Recommended annual ophthalmology exams for early detection of glaucoma and other disorders of the eye. Is the patient up to date with their annual eye exam?  Yes  Who is the provider or what is the  name of the office in which the patient attends annual eye exams? Dr. Einar Gip  If pt is not established with a provider, would they like to be referred to a provider to establish care? No .   Dental Screening: Recommended annual dental exams for proper oral hygiene  Community Resource Referral / Chronic Care Management: CRR required this visit?  No   CCM required this visit?  No      Plan:     I have personally reviewed and noted the following in the patient's chart:   . Medical and social history . Use of alcohol, tobacco or illicit drugs  . Current medications and supplements . Functional ability and status . Nutritional status . Physical activity . Advanced directives . List of other physicians . Hospitalizations, surgeries, and ER visits in previous 12 months . Vitals . Screenings to  include cognitive, depression, and falls . Referrals and appointments  In addition, I have reviewed and discussed with patient certain preventive protocols, quality metrics, and best practice recommendations. A written personalized care plan for preventive services as well as general preventive health recommendations were provided to patient.     Ofilia Neas, LPN   09/14/1189   Nurse Notes: None

## 2019-11-12 NOTE — Patient Instructions (Signed)
Gary Yates , Thank you for taking time to come for your Medicare Wellness Visit. I appreciate your ongoing commitment to your health goals. Please review the following plan we discussed and let me know if I can assist you in the future.   Screening recommendations/referrals: Colonoscopy: No longer required  Recommended yearly ophthalmology/optometry visit for glaucoma screening and checkup Recommended yearly dental visit for hygiene and checkup  Vaccinations: Influenza vaccine: Up to date, next due fall 2022 Pneumococcal vaccine: Completed series  Tdap vaccine: Up to date, next due 05/09/2025 Shingles vaccine: Completed series     Advanced directives: Please bring in a copy of your advanced medical directives into our office so that we may scan them into the chart.  Conditions/risks identified: None  Next appointment: 11/29/2019 @ 9:00 am with Dr. Elease Hashimoto   Preventive Care 84 Years and Older, Male Preventive care refers to lifestyle choices and visits with your health care provider that can promote health and wellness. What does preventive care include?  A yearly physical exam. This is also called an annual well check.  Dental exams once or twice a year.  Routine eye exams. Ask your health care provider how often you should have your eyes checked.  Personal lifestyle choices, including:  Daily care of your teeth and gums.  Regular physical activity.  Eating a healthy diet.  Avoiding tobacco and drug use.  Limiting alcohol use.  Practicing safe sex.  Taking low doses of aspirin every day.  Taking vitamin and mineral supplements as recommended by your health care provider. What happens during an annual well check? The services and screenings done by your health care provider during your annual well check will depend on your age, overall health, lifestyle risk factors, and family history of disease. Counseling  Your health care provider may ask you questions about  your:  Alcohol use.  Tobacco use.  Drug use.  Emotional well-being.  Home and relationship well-being.  Sexual activity.  Eating habits.  History of falls.  Memory and ability to understand (cognition).  Work and work Statistician. Screening  You may have the following tests or measurements:  Height, weight, and BMI.  Blood pressure.  Lipid and cholesterol levels. These may be checked every 5 years, or more frequently if you are over 68 years old.  Skin check.  Lung cancer screening. You may have this screening every year starting at age 88 if you have a 30-pack-year history of smoking and currently smoke or have quit within the past 15 years.  Fecal occult blood test (FOBT) of the stool. You may have this test every year starting at age 59.  Flexible sigmoidoscopy or colonoscopy. You may have a sigmoidoscopy every 5 years or a colonoscopy every 10 years starting at age 47.  Prostate cancer screening. Recommendations will vary depending on your family history and other risks.  Hepatitis C blood test.  Hepatitis B blood test.  Sexually transmitted disease (STD) testing.  Diabetes screening. This is done by checking your blood sugar (glucose) after you have not eaten for a while (fasting). You may have this done every 1-3 years.  Abdominal aortic aneurysm (AAA) screening. You may need this if you are a current or former smoker.  Osteoporosis. You may be screened starting at age 13 if you are at high risk. Talk with your health care provider about your test results, treatment options, and if necessary, the need for more tests. Vaccines  Your health care provider may recommend certain vaccines,  such as:  Influenza vaccine. This is recommended every year.  Tetanus, diphtheria, and acellular pertussis (Tdap, Td) vaccine. You may need a Td booster every 10 years.  Zoster vaccine. You may need this after age 63.  Pneumococcal 13-valent conjugate (PCV13) vaccine.  One dose is recommended after age 25.  Pneumococcal polysaccharide (PPSV23) vaccine. One dose is recommended after age 49. Talk to your health care provider about which screenings and vaccines you need and how often you need them. This information is not intended to replace advice given to you by your health care provider. Make sure you discuss any questions you have with your health care provider. Document Released: 01/20/2015 Document Revised: 09/13/2015 Document Reviewed: 10/25/2014 Elsevier Interactive Patient Education  2017 [place] Prevention in the Home Falls can cause injuries. They can happen to people of all ages. There are many things you can do to make your home safe and to help prevent falls. What can I do on the outside of my home?  Regularly fix the edges of walkways and driveways and fix any cracks.  Remove anything that might make you trip as you walk through a door, such as a raised step or threshold.  Trim any bushes or trees on the path to your home.  Use bright outdoor lighting.  Clear any walking paths of anything that might make someone trip, such as rocks or tools.  Regularly check to see if handrails are loose or broken. Make sure that both sides of any steps have handrails.  Any raised decks and porches should have guardrails on the edges.  Have any leaves, snow, or ice cleared regularly.  Use sand or salt on walking paths during winter.  Clean up any spills in your garage right away. This includes oil or grease spills. What can I do in the bathroom?  Use night lights.  Install grab bars by the toilet and in the tub and shower. Do not use towel bars as grab bars.  Use non-skid mats or decals in the tub or shower.  If you need to sit down in the shower, use a plastic, non-slip stool.  Keep the floor dry. Clean up any water that spills on the floor as soon as it happens.  Remove soap buildup in the tub or shower regularly.  Attach bath  mats securely with double-sided non-slip rug tape.  Do not have throw rugs and other things on the floor that can make you trip. What can I do in the bedroom?  Use night lights.  Make sure that you have a light by your bed that is easy to reach.  Do not use any sheets or blankets that are too big for your bed. They should not hang down onto the floor.  Have a firm chair that has side arms. You can use this for support while you get dressed.  Do not have throw rugs and other things on the floor that can make you trip. What can I do in the kitchen?  Clean up any spills right away.  Avoid walking on wet floors.  Keep items that you use a lot in easy-to-reach places.  If you need to reach something above you, use a strong step stool that has a grab bar.  Keep electrical cords out of the way.  Do not use floor polish or wax that makes floors slippery. If you must use wax, use non-skid floor wax.  Do not have throw rugs and other things on  the floor that can make you trip. What can I do with my stairs?  Do not leave any items on the stairs.  Make sure that there are handrails on both sides of the stairs and use them. Fix handrails that are broken or loose. Make sure that handrails are as long as the stairways.  Check any carpeting to make sure that it is firmly attached to the stairs. Fix any carpet that is loose or worn.  Avoid having throw rugs at the top or bottom of the stairs. If you do have throw rugs, attach them to the floor with carpet tape.  Make sure that you have a light switch at the top of the stairs and the bottom of the stairs. If you do not have them, ask someone to add them for you. What else can I do to help prevent falls?  Wear shoes that:  Do not have high heels.  Have rubber bottoms.  Are comfortable and fit you well.  Are closed at the toe. Do not wear sandals.  If you use a stepladder:  Make sure that it is fully opened. Do not climb a closed  stepladder.  Make sure that both sides of the stepladder are locked into place.  Ask someone to hold it for you, if possible.  Clearly mark and make sure that you can see:  Any grab bars or handrails.  First and last steps.  Where the edge of each step is.  Use tools that help you move around (mobility aids) if they are needed. These include:  Canes.  Walkers.  Scooters.  Crutches.  Turn on the lights when you go into a dark area. Replace any light bulbs as soon as they burn out.  Set up your furniture so you have a clear path. Avoid moving your furniture around.  If any of your floors are uneven, fix them.  If there are any pets around you, be aware of where they are.  Review your medicines with your doctor. Some medicines can make you feel dizzy. This can increase your chance of falling. Ask your doctor what other things that you can do to help prevent falls. This information is not intended to replace advice given to you by your health care provider. Make sure you discuss any questions you have with your health care provider. Document Released: 10/20/2008 Document Revised: 06/01/2015 Document Reviewed: 01/28/2014 Elsevier Interactive Patient Education  2017 [employer].

## 2019-11-15 ENCOUNTER — Ambulatory Visit (INDEPENDENT_AMBULATORY_CARE_PROVIDER_SITE_OTHER): Payer: Medicare Other | Admitting: General Practice

## 2019-11-15 DIAGNOSIS — I480 Paroxysmal atrial fibrillation: Secondary | ICD-10-CM

## 2019-11-15 LAB — POCT INR: INR: 1.8 — AB (ref 2.0–3.0)

## 2019-11-15 NOTE — Patient Instructions (Addendum)
Pre visit review using our clinic review tool, if applicable. No additional management support is needed unless otherwise documented below in the visit note.  Take 1 tablets today and then change dosage and take 1 tablet daily except 1/2 tablet on Monday and Fridays.  Re-check in 2 weeks.  Dosing instructions given to [name] @ [facility].  [phone].

## 2019-11-24 ENCOUNTER — Other Ambulatory Visit: Payer: Self-pay | Admitting: Family Medicine

## 2019-11-27 ENCOUNTER — Other Ambulatory Visit: Payer: Self-pay | Admitting: Family

## 2019-11-27 DIAGNOSIS — I872 Venous insufficiency (chronic) (peripheral): Secondary | ICD-10-CM

## 2019-11-29 ENCOUNTER — Ambulatory Visit (INDEPENDENT_AMBULATORY_CARE_PROVIDER_SITE_OTHER): Payer: Medicare Other | Admitting: General Practice

## 2019-11-29 ENCOUNTER — Encounter: Payer: Self-pay | Admitting: Family Medicine

## 2019-11-29 ENCOUNTER — Other Ambulatory Visit: Payer: Self-pay | Admitting: Family Medicine

## 2019-11-29 ENCOUNTER — Telehealth (INDEPENDENT_AMBULATORY_CARE_PROVIDER_SITE_OTHER): Payer: Medicare Other | Admitting: Family Medicine

## 2019-11-29 VITALS — Ht 69.0 in | Wt 221.0 lb

## 2019-11-29 DIAGNOSIS — E1140 Type 2 diabetes mellitus with diabetic neuropathy, unspecified: Secondary | ICD-10-CM

## 2019-11-29 DIAGNOSIS — R600 Localized edema: Secondary | ICD-10-CM

## 2019-11-29 DIAGNOSIS — Z794 Long term (current) use of insulin: Secondary | ICD-10-CM

## 2019-11-29 DIAGNOSIS — I480 Paroxysmal atrial fibrillation: Secondary | ICD-10-CM

## 2019-11-29 DIAGNOSIS — I872 Venous insufficiency (chronic) (peripheral): Secondary | ICD-10-CM

## 2019-11-29 LAB — POCT INR: INR: 2.6 (ref 2.0–3.0)

## 2019-11-29 NOTE — Progress Notes (Signed)
Patient ID: Gary Yates., male   DOB: 1933-06-13, 86 y.o.   MRN: [id number]  This visit type was conducted due to national recommendations for restrictions regarding the COVID-19 pandemic in an effort to limit this patient's exposure and mitigate transmission in our community.   Virtual Visit via Telephone Note  I connected with Gary Yates on 11/29/19 at  1:30 PM EST by telephone and verified that I am speaking with the correct person using two identifiers.   I discussed the limitations, risks, security and privacy concerns of performing an evaluation and management service by telephone and the availability of in person appointments. I also discussed with the patient that there may be a patient responsible charge related to this service. The patient expressed understanding and agreed to proceed.  Location patient: home Location provider: work or home office Participants present for the call: patient, provider Patient did not have a visit in the prior 7 days to address this/these issue(s).   History of Present Illness: Gary Yates has chronic problems including history of CAD, hypertension, atrial fibrillation, COPD, obstructive sleep apnea, diabetic peripheral neuropathy, type 2 diabetes, chronic kidney disease stage III, non-Hodgkin's lymphoma.  He had some recent problems with bilateral leg edema and weight gain.  He came into our office on October 1 with weight of 241 pounds.  We increased his diuretic and this had improved to 233 pounds on the 18th.  He states by his scales at home right now his weight is between 220 and 221 pounds.  He feels the edema has improved.  He has been using some compression and trying to elevate as much as possible.  Currently on furosemide 60 mg daily.  He still has some peripheral edema.  No increased dyspnea.  INR reportedly 2.6 earlier today.  He states he is getting follow-up labs through oncology a week from tomorrow.  That will include chemistries  Past  Medical History:  Diagnosis Date  . Anemia in chronic renal disease 05/07/2017  . Anxiety   . Atrial fibrillation ([place])   . COPD (chronic obstructive pulmonary disease) ([place])    pt. denies  . Coronary artery disease    a. h/o Overlapping stents RCA;  b. 06/2011 Cath: patent stents, nonobs dzs, NL EF.  . Diabetic peripheral neuropathy ([place])   . Diffuse non-Hodgkin's lymphoma of testis ([place]) 09/28/2015  . DM (diabetes mellitus) ([place])    Type 2, peripheral neuropathy.  . Dyspnea    with exertion  . Dysrhythmia   . GERD (gastroesophageal reflux disease)   . Headache   . History of bronchitis   . History of kidney stones   . History of radiation therapy 02/19/16 - 03/13/16   Testis/Scrotum: 32.4 Gy in 18 fractions  . History of radiation therapy 08/07/16-08/20/16   left adrenal gland mass treated to 30 Gy in 10 fractions  . Hyperlipidemia   . Hypertension   . Iron deficiency anemia due to chronic blood loss 08/08/2017  . Low testosterone   . Nephrolithiasis   . OSA (obstructive sleep apnea) 11/26/2017  . Osteoarthritis    shoulder  . Restless leg   . SVT (supraventricular tachycardia) ([place])   . Urinary frequency   . Wears partial dentures    upper and lower   Past Surgical History:  Procedure Laterality Date  . APPENDECTOMY    . [place]  . CARDIAC CATHETERIZATION  01/2013  . CATARACT EXTRACTION, BILATERAL    . CHOLECYSTECTOMY    .  COLONOSCOPY    . CORONARY ANGIOPLASTY  2004  . CYSTOSCOPY N/A 08/18/2017   Procedure: CYSTOSCOPY WITH FULGURATION AND SUPRA PUBIC TUBE PLACEMENT;  Surgeon: Kathie Rhodes, MD;  Location: [facility];  Service: Urology;  Laterality: N/A;  . EYE SURGERY Bilateral    cataracts  . IR GENERIC HISTORICAL  10/05/2015   IR US GUIDE VASC ACCESS RIGHT 10/05/2015 Marybelle Killings, MD [facility]  . IR GENERIC HISTORICAL  10/05/2015   IR FLUORO GUIDE PORT INSERTION RIGHT 10/05/2015 Marybelle Killings, MD [facility]  . LEFT HEART CATHETERIZATION WITH CORONARY  ANGIOGRAM N/A 06/18/2011   Procedure: LEFT HEART CATHETERIZATION WITH CORONARY ANGIOGRAM;  Surgeon: Peter M Martinique, MD;  Location: [facility] CATH LAB;  Service: Cardiovascular;  Laterality: N/A;  . LEFT HEART CATHETERIZATION WITH CORONARY ANGIOGRAM N/A 01/27/2013   Procedure: LEFT HEART CATHETERIZATION WITH CORONARY ANGIOGRAM;  Surgeon: Burnell Blanks, MD;  Location: [facility] CATH LAB;  Service: Cardiovascular;  Laterality: N/A;  . LUMBAR LAMINECTOMY/DECOMPRESSION MICRODISCECTOMY N/A 02/07/2015   Procedure: Lumbar three-Sacral one Decompression;  Surgeon: Kevan Ny Ditty, MD;  Location: [facility];  Service: Neurosurgery;  Laterality: N/A;  L3 to S1 Decompression  . MULTIPLE TOOTH EXTRACTIONS    . ORCHIECTOMY Right 09/01/2015   Procedure: RIGHT ORCHIECTOMY;  Surgeon: Kathie Rhodes, MD;  Location: [facility];  Service: Urology;  Laterality: Right;  . port a cath in place     . ROTATOR CUFF REPAIR Left     reports that he quit smoking about 41 years ago. His smoking use included cigarettes. He has a 30.00 pack-year smoking history. He has never used smokeless tobacco. He reports that he does not drink alcohol and does not use drugs. family history includes Alzheimer's disease in his mother; Arthritis in his brother and sister; Coronary artery disease in some other family members; Heart disease in his brother, father, mother, and sister; Migraines in his daughter and father; Obesity in his sister and son; Prostate cancer in his brother; Sleep apnea in his son; Thyroid disease in his daughter; Ulcers in his father. Allergies  Allergen Reactions  . Latex Other (See Comments)    Reddens the skin  . Ace Inhibitors Cough  . Codeine Nausea Only and Rash       . Penicillins Rash    Childhood allergy Has patient had a PCN reaction causing immediate rash, facial/tongue/throat swelling, SOB or lightheadedness with hypotension: Yes Has patient had a PCN reaction causing severe rash involving mucus membranes or  skin necrosis: Yes Has patient had a PCN reaction that required hospitalization No Has patient had a PCN reaction occurring within the last 10 years: No If all of the above answers are "NO", then may proceed with Cephalosporin use.       Observations/Objective: Patient sounds cheerful and well on the phone. I do not appreciate any SOB. Speech and thought processing are grossly intact. Patient reported vitals:  Assessment and Plan:  #1 bilateral leg edema-likely multifactorial.  Overall improved.  His weight has come down significantly over the past month from 241 pounds to 221 currently.  He is still describing some edema  -Can increase Lasix to 80 mg daily for 5 days but then drop back to 60 mg -Future labs already in computer which he plans to get next week  #2 type 2 diabetes well controlled with recent A1c 6.6% on 10/11/2019  Follow Up Instructions:  -3 months   99441 5-10 99442 11-20 99443 21-30 I did not refer this patient for  an OV in the next 24 hours for this/these issue(s).  I discussed the assessment and treatment plan with the patient. The patient was provided an opportunity to ask questions and all were answered. The patient agreed with the plan and demonstrated an understanding of the instructions.   The patient was advised to call back or seek an in-person evaluation if the symptoms worsen or if the condition fails to improve as anticipated.  I provided 25 minutes of non-face-to-face time during this encounter.   Carolann Littler, MD

## 2019-11-29 NOTE — Patient Instructions (Addendum)
Pre visit review using our clinic review tool, if applicable. No additional management support is needed unless otherwise documented below in the visit note.  Continue to take 1 tablet daily except 1/2 tablet on Monday and Fridays.  Re-check in 2 weeks.  Dosing instructions given to [name] @ [facility].  [phone].  Patient had a virtual visit with Dr. Elease Hashimoto today.

## 2019-12-01 ENCOUNTER — Inpatient Hospital Stay: Payer: Medicare Other | Attending: Hematology & Oncology

## 2019-12-01 ENCOUNTER — Inpatient Hospital Stay (HOSPITAL_BASED_OUTPATIENT_CLINIC_OR_DEPARTMENT_OTHER): Payer: Medicare Other | Admitting: Hematology & Oncology

## 2019-12-01 ENCOUNTER — Inpatient Hospital Stay: Payer: Medicare Other

## 2019-12-01 ENCOUNTER — Encounter: Payer: Self-pay | Admitting: Hematology & Oncology

## 2019-12-01 ENCOUNTER — Other Ambulatory Visit: Payer: Self-pay

## 2019-12-01 ENCOUNTER — Telehealth: Payer: Self-pay | Admitting: Hematology & Oncology

## 2019-12-01 VITALS — BP 113/59 | HR 78 | Temp 98.3°F | Resp 19 | Ht 69.0 in | Wt 223.0 lb

## 2019-12-01 DIAGNOSIS — Z8572 Personal history of non-Hodgkin lymphomas: Secondary | ICD-10-CM | POA: Insufficient documentation

## 2019-12-01 DIAGNOSIS — N189 Chronic kidney disease, unspecified: Secondary | ICD-10-CM | POA: Insufficient documentation

## 2019-12-01 DIAGNOSIS — Z7984 Long term (current) use of oral hypoglycemic drugs: Secondary | ICD-10-CM | POA: Diagnosis not present

## 2019-12-01 DIAGNOSIS — Z79899 Other long term (current) drug therapy: Secondary | ICD-10-CM | POA: Diagnosis not present

## 2019-12-01 DIAGNOSIS — C8589 Other specified types of non-Hodgkin lymphoma, extranodal and solid organ sites: Secondary | ICD-10-CM

## 2019-12-01 DIAGNOSIS — N1831 Chronic kidney disease, stage 3a: Secondary | ICD-10-CM

## 2019-12-01 DIAGNOSIS — Z923 Personal history of irradiation: Secondary | ICD-10-CM | POA: Insufficient documentation

## 2019-12-01 DIAGNOSIS — R600 Localized edema: Secondary | ICD-10-CM | POA: Insufficient documentation

## 2019-12-01 DIAGNOSIS — Z7901 Long term (current) use of anticoagulants: Secondary | ICD-10-CM | POA: Diagnosis not present

## 2019-12-01 DIAGNOSIS — D631 Anemia in chronic kidney disease: Secondary | ICD-10-CM | POA: Diagnosis not present

## 2019-12-01 DIAGNOSIS — Z9221 Personal history of antineoplastic chemotherapy: Secondary | ICD-10-CM | POA: Diagnosis not present

## 2019-12-01 DIAGNOSIS — N181 Chronic kidney disease, stage 1: Secondary | ICD-10-CM

## 2019-12-01 LAB — CBC WITH DIFFERENTIAL (CANCER CENTER ONLY)
Abs Immature Granulocytes: 0.04 10*3/uL (ref 0.00–0.07)
Basophils Absolute: 0.1 10*3/uL (ref 0.0–0.1)
Basophils Relative: 1 %
Eosinophils Absolute: 0.3 10*3/uL (ref 0.0–0.5)
Eosinophils Relative: 4 %
HCT: 31.3 % — ABNORMAL LOW (ref 39.0–52.0)
Hemoglobin: 10.2 g/dL — ABNORMAL LOW (ref 13.0–17.0)
Immature Granulocytes: 1 %
Lymphocytes Relative: 25 %
Lymphs Abs: 1.8 10*3/uL (ref 0.7–4.0)
MCH: 31.4 pg (ref 26.0–34.0)
MCHC: 32.6 g/dL (ref 30.0–36.0)
MCV: 96.3 fL (ref 80.0–100.0)
Monocytes Absolute: 0.6 10*3/uL (ref 0.1–1.0)
Monocytes Relative: 9 %
Neutro Abs: 4.4 10*3/uL (ref 1.7–7.7)
Neutrophils Relative %: 60 %
Platelet Count: 152 10*3/uL (ref 150–400)
RBC: 3.25 MIL/uL — ABNORMAL LOW (ref 4.22–5.81)
RDW: 13.6 % (ref 11.5–15.5)
WBC Count: 7.2 10*3/uL (ref 4.0–10.5)
nRBC: 0 % (ref 0.0–0.2)

## 2019-12-01 LAB — CMP (CANCER CENTER ONLY)
ALT: <5 U/L (ref 0–44)
AST: 12 U/L — ABNORMAL LOW (ref 15–41)
Albumin: 4 g/dL (ref 3.5–5.0)
Alkaline Phosphatase: 66 U/L (ref 38–126)
Anion gap: 10 (ref 5–15)
BUN: 31 mg/dL — ABNORMAL HIGH (ref 8–23)
CO2: 27 mmol/L (ref 22–32)
Calcium: 9.6 mg/dL (ref 8.9–10.3)
Chloride: 99 mmol/L (ref 98–111)
Creatinine: 1.69 mg/dL — ABNORMAL HIGH (ref 0.61–1.24)
GFR, Estimated: 40 mL/min — ABNORMAL LOW (ref >60)
Glucose, Bld: 102 mg/dL — ABNORMAL HIGH (ref 70–99)
Potassium: 4 mmol/L (ref 3.5–5.1)
Sodium: 136 mmol/L (ref 135–145)
Total Bilirubin: 0.4 mg/dL (ref 0.3–1.2)
Total Protein: 7.5 g/dL (ref 6.5–8.1)

## 2019-12-01 LAB — IRON AND TIBC
Iron: 59 ug/dL (ref 45–182)
Saturation Ratios: 23 % (ref 17.9–39.5)
TIBC: 257 ug/dL (ref 250–450)
UIBC: 198 ug/dL

## 2019-12-01 LAB — RETICULOCYTES
Immature Retic Fract: 14.1 % (ref 2.3–15.9)
RBC.: 3.23 MIL/uL — ABNORMAL LOW (ref 4.22–5.81)
Retic Count, Absolute: 38.8 10*3/uL (ref 19.0–186.0)
Retic Ct Pct: 1.2 % (ref 0.4–3.1)

## 2019-12-01 LAB — FERRITIN: Ferritin: 572 ng/mL — ABNORMAL HIGH (ref 24–336)

## 2019-12-01 NOTE — Telephone Encounter (Signed)
Appointments scheduled calendar printed per 11/24 los

## 2019-12-01 NOTE — Patient Instructions (Signed)

## 2019-12-01 NOTE — Progress Notes (Signed)
Hematology and Oncology Follow Up Visit  Gary Yates [id number] [birth year] 86 y.o. 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  Overall, he looks better.  He is on a higher dose of Lasix.  He does not have as much swelling in his legs.  He has had no problems with cough or shortness of breath.  He is on Coumadin.  His last INR was 2.6.  He has had no rashes.  There is been no problems with the indwelling Foley catheter.  He drained his catheter bag today in the office.  He has had no issues with headache.  He still has a sleeping problems with this is been chronic.  He has no evidence of recurrent lymphoma.  He now is out about 2-1/2 years.  Is very pleased that his lymphoma is not come back yet.  He now is a great grandfather.  A grandson had a another son.  I am so happy that he is a great grandfather.   I do feel bad about his poor wife who has progressive dementia and she is declining.  Her problem that he has is significant edema down his legs below his knees.  He has been hospitalized back in the summer.  He had work-up for all this.  He has CT scans.  CT scans were really unremarkable.  He has been no evidence of lymphoma recurrence.  I think he was in rehab for a couple weeks.  He still has quite a bit of edema.  He is on diuretics.  He does have an indwelling Foley catheter which actually has helped him.  He empties the Foley bag several times a day.  His poor wife is  really suffering from dementia.  She did not even know who I was.  This is very sad.  I really hate this for him and for her.  Overall, his performance status is ECOG 2-3.    Medications:  Current Outpatient Medications:  .  acetaminophen (TYLENOL) 500 MG tablet, Take 1,000 mg by mouth every 6 (six) hours as needed (for pain/fever/headaches.). , Disp: , Rfl:  .  atorvastatin (LIPITOR) 40 MG tablet, TAKE 1 TABLET BY MOUTH EVERY DAY, Disp: 90 tablet, Rfl: 1 .  docusate sodium (COLACE) 50 MG capsule, Take 1 capsule (50 mg total) by mouth 2 (two) times daily as needed (for constipation)., Disp: 90 capsule, Rfl: 0 .  furosemide (LASIX) 40 MG tablet, Take 1.5 tablets (60 mg total) by mouth daily., Disp: 135 tablet, Rfl: 1 .  gabapentin (NEURONTIN) 300 MG capsule, Take 600 mg by mouth 2 (two) times daily., Disp: , Rfl:  .  magic mouthwash w/lidocaine SOLN, Take 5 mLs by mouth 3 (three) times daily as needed., Disp: 180 mL, Rfl: 0 .  METFORMIN HCL PO, Take 500 mg by mouth. 1 tablet by mouth twice daily with meal, Disp: ,  Rfl:  .  nystatin (MYCOSTATIN/NYSTOP) powder, APPLY TO AFFECTED AREA 4 TIMES A DAY, Disp: 60 g, Rfl: 0 .  nystatin-triamcinolone ointment (MYCOLOG), Apply 1 application topically 2 (two) times daily., Disp: 60 g, Rfl: 0 .  pantoprazole (PROTONIX) 40 MG tablet, TAKE 1 TABLET BY MOUTH EVERY DAY, Disp: 30 tablet, Rfl: 11 .  polyethylene glycol (MIRALAX / GLYCOLAX) 17 g packet, Take 17 g by mouth as needed. , Disp: , Rfl:  .  pramipexole (MIRAPEX) 1.5 MG tablet, TAKE 2 TABLETS BY MOUTH EVERY EVENING, Disp: 180 tablet, Rfl: 1 .  warfarin (COUMADIN) 5 MG tablet, Take 1 tablet (5 mg total) by mouth daily., Disp: 30 tablet, Rfl: 0 .  metoprolol succinate (TOPROL-XL) 25 MG 24 hr tablet, Take 1 tablet (25 mg total) by mouth daily., Disp: 30 tablet, Rfl: 0 .  nitroGLYCERIN (NITROSTAT) 0.4 MG SL tablet, Place 1 tablet (0.4 mg total) under the tongue every 5 (five) minutes as needed. Chest pain  (Patient not taking: Reported on 10/25/2019), Disp: 25 tablet, Rfl: 6 .  potassium chloride SA (KLOR-CON M20) 20 MEQ tablet, Take 1 tablet (20 mEq total) by mouth daily., Disp: 30 tablet, Rfl: 0  Allergies:  Allergies  Allergen Reactions  . Latex Other (See Comments)    Reddens the skin  . Ace Inhibitors Cough  . Codeine Nausea Only and Rash       . Penicillins Rash    Childhood allergy Has patient had a PCN reaction causing immediate rash, facial/tongue/throat swelling, SOB or lightheadedness with hypotension: Yes Has patient had a PCN reaction causing severe rash involving mucus membranes or skin necrosis: Yes Has patient had a PCN reaction that required hospitalization No Has patient had a PCN reaction occurring within the last 10 years: No If all of the above answers are "NO", then may proceed with Cephalosporin use.     Past Medical History, Surgical history, Social history, and Family History were reviewed and updated.  Review of Systems: Review of Systems  HENT: Positive for sinus pain and sore throat.   Eyes: Positive for blurred vision.  Respiratory: Positive for wheezing.   Cardiovascular: Positive for leg swelling.  Gastrointestinal: Negative.   Genitourinary: Negative.   Musculoskeletal: Positive for back pain.  Skin: Negative.   Neurological: Positive for weakness.  Endo/Heme/Allergies: Negative.   Psychiatric/Behavioral: Negative.     Physical Exam:  height is 5\' 9"  (1.753 m) and weight is 223 lb (101.2 kg). His oral temperature is 98.3 F (36.8 C). His blood pressure is 113/59 (abnormal) and his pulse is 78. His respiration is 19 and oxygen saturation is 100%.   Wt Readings from Last 3 Encounters:  12/01/19 223 lb (101.2 kg)  11/29/19 221 lb (100.2 kg)  10/25/19 233 lb 4.8 oz (105.8 kg)      Physical Exam Vitals reviewed.  HENT:     Head: Normocephalic and atraumatic.  Eyes:     Pupils: Pupils are equal, round, and reactive to light.   Cardiovascular:     Rate and Rhythm: Normal rate and regular rhythm.     Heart sounds: Normal heart sounds.  Pulmonary:     Effort: Pulmonary effort is normal.     Breath sounds: Normal breath sounds.  Abdominal:     General: Bowel sounds are normal.     Palpations: Abdomen is soft.  Musculoskeletal:        General: No tenderness or deformity. Normal range of motion.     Cervical back: Normal  range of motion.  Lymphadenopathy:     Cervical: No cervical adenopathy.  Skin:    General: Skin is warm and dry.     Findings: No erythema or rash.  Neurological:     Mental Status: He is alert and oriented to person, place, and time.  Psychiatric:        Behavior: Behavior normal.        Thought Content: Thought content normal.        Judgment: Judgment normal.      Lab Results  Component Value Date   WBC 7.2 12/01/2019   HGB 10.2 (L) 12/01/2019   HCT 31.3 (L) 12/01/2019   MCV 96.3 12/01/2019   PLT 152 12/01/2019     Chemistry      Component Value Date/Time   NA 136 12/01/2019 1345   NA 137 08/18/2019 0000   NA 141 01/10/2017 1136   NA 137 12/14/2015 1100   K 4.0 12/01/2019 1345   K 4.4 01/10/2017 1136   K 3.7 12/14/2015 1100   CL 99 12/01/2019 1345   CL 104 01/10/2017 1136   CO2 27 12/01/2019 1345   CO2 27 01/10/2017 1136   CO2 24 12/14/2015 1100   BUN 31 (H) 12/01/2019 1345   BUN 29 (A) 08/18/2019 0000   BUN 19 01/10/2017 1136   BUN 18.4 12/14/2015 1100   CREATININE 1.69 (H) 12/01/2019 1345   CREATININE 1.76 (H) 10/08/2019 0100   CREATININE 0.9 12/14/2015 1100   GLU 122 08/18/2019 0000      Component Value Date/Time   CALCIUM 9.6 12/01/2019 1345   CALCIUM 9.4 01/10/2017 1136   CALCIUM 9.5 12/14/2015 1100   ALKPHOS 66 12/01/2019 1345   ALKPHOS 112 (H) 01/10/2017 1136   ALKPHOS 102 12/14/2015 1100   AST 12 (L) 12/01/2019 1345   AST 22 12/14/2015 1100   ALT <5 12/01/2019 1345   ALT 18 01/10/2017 1136   ALT 10 12/14/2015 1100   BILITOT 0.4 12/01/2019 1345    BILITOT 0.58 12/14/2015 1100      Impression and Plan: Gary Yates is an 86 year old white male. He has relapsed large cell non-Hodgkin lymphoma of the testicle. He was on salvage chemotherapy with Rituxan/bendamustine/Velcade.  He did not really respond well to this.  We then treated him with dose reduced R-ICE.  He responded very well to this.  His last PET scan, which was done in November 2019, showed that he was in remission.    Today, we do not have to give him any Aranesp.    We will see what his iron levels are.   I think we now get him back in 3 months.   Volanda Napoleon, MD 11/24/20213:01 PM

## 2019-12-03 ENCOUNTER — Other Ambulatory Visit: Payer: Self-pay | Admitting: Family Medicine

## 2019-12-03 DIAGNOSIS — I872 Venous insufficiency (chronic) (peripheral): Secondary | ICD-10-CM

## 2019-12-06 ENCOUNTER — Telehealth: Payer: Self-pay | Admitting: Family Medicine

## 2019-12-06 NOTE — Telephone Encounter (Signed)
Rx called in to pharmacy. 

## 2019-12-06 NOTE — Telephone Encounter (Signed)
Because of his fairly high dose lasix, I would continue with the K supplement for now.

## 2019-12-06 NOTE — Telephone Encounter (Signed)
Gary Yates with [facility] is calling in stating that they need to have clarification on pts potassium and not sure if he is still taking the medication b/c of the last time that he had it refilled.

## 2019-12-06 NOTE — Telephone Encounter (Signed)
Please advice message below. Potassium was discontinued patient had potassium checked last week. Will patient need to continue with medication for potassium?

## 2019-12-09 ENCOUNTER — Other Ambulatory Visit: Payer: Self-pay | Admitting: Family

## 2019-12-09 ENCOUNTER — Other Ambulatory Visit: Payer: Self-pay | Admitting: Family Medicine

## 2019-12-09 DIAGNOSIS — I872 Venous insufficiency (chronic) (peripheral): Secondary | ICD-10-CM

## 2019-12-09 DIAGNOSIS — I480 Paroxysmal atrial fibrillation: Secondary | ICD-10-CM

## 2019-12-09 DIAGNOSIS — I471 Supraventricular tachycardia: Secondary | ICD-10-CM

## 2019-12-09 NOTE — Telephone Encounter (Signed)
Refill for 6 months. 

## 2019-12-10 ENCOUNTER — Encounter (HOSPITAL_COMMUNITY): Payer: Self-pay

## 2019-12-10 ENCOUNTER — Emergency Department (HOSPITAL_COMMUNITY)
Admission: EM | Admit: 2019-12-10 | Discharge: 2019-12-11 | Disposition: A | Discharge status: Home / Self Care | Payer: Medicare Other | Attending: Emergency Medicine | Admitting: Emergency Medicine

## 2019-12-10 ENCOUNTER — Other Ambulatory Visit: Payer: Self-pay

## 2019-12-10 DIAGNOSIS — I2510 Atherosclerotic heart disease of native coronary artery without angina pectoris: Secondary | ICD-10-CM | POA: Diagnosis not present

## 2019-12-10 DIAGNOSIS — Z7984 Long term (current) use of oral hypoglycemic drugs: Secondary | ICD-10-CM | POA: Insufficient documentation

## 2019-12-10 DIAGNOSIS — J449 Chronic obstructive pulmonary disease, unspecified: Secondary | ICD-10-CM | POA: Diagnosis not present

## 2019-12-10 DIAGNOSIS — Z87891 Personal history of nicotine dependence: Secondary | ICD-10-CM | POA: Insufficient documentation

## 2019-12-10 DIAGNOSIS — R55 Syncope and collapse: Secondary | ICD-10-CM

## 2019-12-10 DIAGNOSIS — N183 Chronic kidney disease, stage 3 unspecified: Secondary | ICD-10-CM | POA: Diagnosis not present

## 2019-12-10 DIAGNOSIS — E1140 Type 2 diabetes mellitus with diabetic neuropathy, unspecified: Secondary | ICD-10-CM | POA: Insufficient documentation

## 2019-12-10 DIAGNOSIS — I129 Hypertensive chronic kidney disease with stage 1 through stage 4 chronic kidney disease, or unspecified chronic kidney disease: Secondary | ICD-10-CM | POA: Insufficient documentation

## 2019-12-10 DIAGNOSIS — Z79899 Other long term (current) drug therapy: Secondary | ICD-10-CM | POA: Diagnosis not present

## 2019-12-10 DIAGNOSIS — I4891 Unspecified atrial fibrillation: Secondary | ICD-10-CM | POA: Insufficient documentation

## 2019-12-10 DIAGNOSIS — Z91040 Latex allergy status: Secondary | ICD-10-CM | POA: Insufficient documentation

## 2019-12-10 DIAGNOSIS — R42 Dizziness and giddiness: Secondary | ICD-10-CM | POA: Insufficient documentation

## 2019-12-10 DIAGNOSIS — Z7901 Long term (current) use of anticoagulants: Secondary | ICD-10-CM | POA: Diagnosis not present

## 2019-12-10 LAB — BASIC METABOLIC PANEL
Anion gap: 13 (ref 5–15)
BUN: 40 mg/dL — ABNORMAL HIGH (ref 8–23)
CO2: 24 mmol/L (ref 22–32)
Calcium: 9.1 mg/dL (ref 8.9–10.3)
Chloride: 100 mmol/L (ref 98–111)
Creatinine, Ser: 1.92 mg/dL — ABNORMAL HIGH (ref 0.61–1.24)
GFR, Estimated: 34 mL/min — ABNORMAL LOW (ref >60)
Glucose, Bld: 109 mg/dL — ABNORMAL HIGH (ref 70–99)
Potassium: 4.3 mmol/L (ref 3.5–5.1)
Sodium: 137 mmol/L (ref 135–145)

## 2019-12-10 LAB — URINALYSIS, ROUTINE W REFLEX MICROSCOPIC
Bilirubin Urine: NEGATIVE
Glucose, UA: NEGATIVE mg/dL
Ketones, ur: NEGATIVE mg/dL
Nitrite: POSITIVE — AB
Protein, ur: 30 mg/dL — AB
Specific Gravity, Urine: 1.011 (ref 1.005–1.030)
pH: 8 (ref 5.0–8.0)

## 2019-12-10 LAB — CBC
HCT: 31.7 % — ABNORMAL LOW (ref 39.0–52.0)
Hemoglobin: 10 g/dL — ABNORMAL LOW (ref 13.0–17.0)
MCH: 30.8 pg (ref 26.0–34.0)
MCHC: 31.5 g/dL (ref 30.0–36.0)
MCV: 97.5 fL (ref 80.0–100.0)
Platelets: 159 10*3/uL (ref 150–400)
RBC: 3.25 MIL/uL — ABNORMAL LOW (ref 4.22–5.81)
RDW: 13.9 % (ref 11.5–15.5)
WBC: 6.2 10*3/uL (ref 4.0–10.5)
nRBC: 0 % (ref 0.0–0.2)

## 2019-12-10 LAB — CBG MONITORING, ED: Glucose-Capillary: 101 mg/dL — ABNORMAL HIGH (ref 70–99)

## 2019-12-10 MED ORDER — ACETAMINOPHEN 325 MG PO TABS
650.0000 mg | ORAL_TABLET | Freq: Once | ORAL | Status: AC
  Administered 2019-12-10: 650 mg via ORAL
  Filled 2019-12-10: qty 2

## 2019-12-10 NOTE — ED Triage Notes (Signed)
Pt reports near syncopal episode last week and again today. Pt also reports generalized weakness. He has 13 teeth pulled 2 days ago, minimal bleeding.

## 2019-12-11 ENCOUNTER — Other Ambulatory Visit: Payer: Self-pay

## 2019-12-11 LAB — TROPONIN I (HIGH SENSITIVITY)
Troponin I (High Sensitivity): 4 ng/L (ref <18)
Troponin I (High Sensitivity): 5 ng/L (ref <18)

## 2019-12-11 MED ORDER — SODIUM CHLORIDE 0.9 % IV BOLUS
500.0000 mL | Freq: Once | INTRAVENOUS | Status: AC
  Administered 2019-12-11: 500 mL via INTRAVENOUS

## 2019-12-11 MED ORDER — SODIUM CHLORIDE 0.9 % IV SOLN: INTRAVENOUS | Status: DC

## 2019-12-11 NOTE — ED Provider Notes (Signed)
[facility] EMERGENCY DEPARTMENT Provider Note   CSN: [id number] Arrival date & time: 12/10/19  1813     History Chief Complaint  Patient presents with  . Near Syncope    Gary Yates. is a 86 y.o. male.  86 year old male who has a history of A. fib who presents with near syncope.  Patient states that he was at home standing and all of a sudden felt an electrical shock go through his body was lasted for about 5 seconds.  He has had recurrent episodes of similar symptoms but has not told anybody for the past several months.  No associated chest pain or shortness of breath with it.  Denies abdominal discomfort.  No any focal weakness.  Symptoms resolved spontaneously.  Did have some associated dizziness.  Unfortunately, patient waited 12 hours in waiting room to be seen but he said during that time he did not experience any recurrence.  Denies any new medications and states compliance with his current regimen.  Patient does note that he has had decreased oral intake due to recent oral surgery.        Past Medical History:  Diagnosis Date  . Anemia in chronic renal disease 05/07/2017  . Anxiety   . Atrial fibrillation ([place])   . COPD (chronic obstructive pulmonary disease) ([place])    pt. denies  . Coronary artery disease    a. h/o Overlapping stents RCA;  b. 06/2011 Cath: patent stents, nonobs dzs, NL EF.  . Diabetic peripheral neuropathy ([place])   . Diffuse non-Hodgkin's lymphoma of testis ([place]) 09/28/2015  . DM (diabetes mellitus) ([place])    Type 2, peripheral neuropathy.  . Dyspnea    with exertion  . Dysrhythmia   . GERD (gastroesophageal reflux disease)   . Headache   . History of bronchitis   . History of kidney stones   . History of radiation therapy 02/19/16 - 03/13/16   Testis/Scrotum: 32.4 Gy in 18 fractions  . History of radiation therapy 08/07/16-08/20/16   left adrenal gland mass treated to 30 Gy in 10 fractions  . Hyperlipidemia   . Hypertension   .  Iron deficiency anemia due to chronic blood loss 08/08/2017  . Low testosterone   . Nephrolithiasis   . OSA (obstructive sleep apnea) 11/26/2017  . Osteoarthritis    shoulder  . Restless leg   . SVT (supraventricular tachycardia) ([place])   . Urinary frequency   . Wears partial dentures    upper and lower    Patient Active Problem List   Diagnosis Date Noted  . COPD (chronic obstructive pulmonary disease) ([place])   . Hypercoagulable state due to atrial fibrillation ([place]) 08/13/2019  . Suprapubic catheter ([place]) 08/13/2019  . History of non-Hodgkin's lymphoma 08/13/2019  . Cardiac arrhythmia 08/07/2019  . Aortic atherosclerosis ([place]) 08/07/2019  . OSA (obstructive sleep apnea) 11/26/2017  . Iron deficiency anemia due to chronic blood loss 08/08/2017  . Sepsis secondary to UTI ([place]) 06/17/2017  . Thrombocytopenia ([place]) 06/17/2017  . Urinary retention 06/17/2017  . Anemia in chronic renal disease 05/07/2017  . V-tach ([place]) 07/02/2016  . Protein-calorie malnutrition, severe 07/02/2016  . Bilateral lower extremity edema 07/01/2016  . Diffuse non-Hodgkin's lymphoma of testis ([place]) 09/28/2015  . Lumbosacral spondylosis with radiculopathy 02/07/2015  . At high risk for falls 05/05/2014  . Encounter for therapeutic drug monitoring 03/15/2013  . Chest pain 02/03/2013  . Obesity (BMI 30-39.9) 09/21/2012  . Type 2 diabetes mellitus with diabetic neuropathy, unspecified (  [place]) 07/03/2012  . Chronic kidney disease, stage III (moderate) ([place]) 07/03/2012  . Restless leg syndrome 12/04/2011  . Long term current use of anticoagulant therapy 07/15/2011  . Paroxysmal atrial fibrillation ([place]) 06/21/2011  . Upper extremity weakness 06/21/2011  . Diabetic peripheral neuropathy ([place]) 12/17/2010  . Hypotestosteronism 05/07/2010  . LEG CRAMPS, IDIOPATHIC 08/14/2009  . Insomnia 08/14/2009  . Malignant neoplasm of skin of face 08/14/2009  . OBESITY 08/02/2009  . HYPERTENSION, HEART CONTROLLED W/O ASSOC  CHF 03/28/2008  . G E R D 01/12/2007  . Morbid obesity ([place]) 09/15/2006  . Hyperlipidemia, mixed 07/01/2006  . Essential hypertension 07/01/2006  . Coronary atherosclerosis 07/01/2006  . OSTEOARTHRITIS 07/01/2006    Past Surgical History:  Procedure Laterality Date  . APPENDECTOMY    . [place]  . CARDIAC CATHETERIZATION  01/2013  . CATARACT EXTRACTION, BILATERAL    . CHOLECYSTECTOMY    . COLONOSCOPY    . CORONARY ANGIOPLASTY  2004  . CYSTOSCOPY N/A 08/18/2017   Procedure: CYSTOSCOPY WITH FULGURATION AND SUPRA PUBIC TUBE PLACEMENT;  Surgeon: Kathie Rhodes, MD;  Location: [facility];  Service: Urology;  Laterality: N/A;  . EYE SURGERY Bilateral    cataracts  . IR GENERIC HISTORICAL  10/05/2015   IR US GUIDE VASC ACCESS RIGHT 10/05/2015 Marybelle Killings, MD [facility]  . IR GENERIC HISTORICAL  10/05/2015   IR FLUORO GUIDE PORT INSERTION RIGHT 10/05/2015 Marybelle Killings, MD [facility]  . LEFT HEART CATHETERIZATION WITH CORONARY ANGIOGRAM N/A 06/18/2011   Procedure: LEFT HEART CATHETERIZATION WITH CORONARY ANGIOGRAM;  Surgeon: Peter M Martinique, MD;  Location: [facility] CATH LAB;  Service: Cardiovascular;  Laterality: N/A;  . LEFT HEART CATHETERIZATION WITH CORONARY ANGIOGRAM N/A 01/27/2013   Procedure: LEFT HEART CATHETERIZATION WITH CORONARY ANGIOGRAM;  Surgeon: Burnell Blanks, MD;  Location: [facility] CATH LAB;  Service: Cardiovascular;  Laterality: N/A;  . LUMBAR LAMINECTOMY/DECOMPRESSION MICRODISCECTOMY N/A 02/07/2015   Procedure: Lumbar three-Sacral one Decompression;  Surgeon: Kevan Ny Ditty, MD;  Location: [facility] NEURO ORS;  Service: Neurosurgery;  Laterality: N/A;  L3 to S1 Decompression  . MULTIPLE TOOTH EXTRACTIONS    . ORCHIECTOMY Right 09/01/2015   Procedure: RIGHT ORCHIECTOMY;  Surgeon: Kathie Rhodes, MD;  Location: [facility];  Service: Urology;  Laterality: Right;  . port a cath in place     . ROTATOR CUFF REPAIR Left        Family History  Problem Relation Age of Onset  .  Alzheimer's disease Mother   . Heart disease Mother   . Heart disease Father   . Migraines Father   . Ulcers Father   . Prostate cancer Brother   . Coronary artery disease Other        Male 1st degree relative <50  . Coronary artery disease Other        male 1st degree relative <60  . Heart disease Sister   . Obesity Sister        Morbid  . Arthritis Sister   . Heart disease Brother   . Arthritis Brother   . Sleep apnea Son   . Obesity Son   . Migraines Daughter   . Thyroid disease Daughter     Social History   Tobacco Use  . Smoking status: Former Smoker    Packs/day: 1.50    Years: 20.00    Pack years: 30.00    Types: Cigarettes    Quit date: 04/05/1978    Years since quitting: 41.7  .  Smokeless tobacco: Never Used  Vaping Use  . Vaping Use: Never used  Substance Use Topics  . Alcohol use: No  . Drug use: No    Home Medications Prior to Admission medications   Medication Sig Start Date End Date Taking? Authorizing Provider  acetaminophen (TYLENOL) 500 MG tablet Take 1,000 mg by mouth every 6 (six) hours as needed (for pain/fever/headaches.).    Yes [provider]  acetaminophen (TYLENOL) 500 MG tablet Take 1,000 mg by mouth every 3 (three) hours as needed for moderate pain.   Yes [provider]  atorvastatin (LIPITOR) 40 MG tablet TAKE 1 TABLET BY MOUTH EVERY DAY Patient taking differently: Take 40 mg by mouth daily.  10/04/19  Yes [name], Alinda Sierras, MD  chlorhexidine (PERIDEX) 0.12 % solution Use as directed 15 mLs in the mouth or throat 2 (two) times daily.  12/07/19  Yes [provider]  docusate sodium (COLACE) 50 MG capsule Take 1 capsule (50 mg total) by mouth 2 (two) times daily as needed (for constipation). 04/22/16  Yes [name], Alinda Sierras, MD  furosemide (LASIX) 40 MG tablet Take 1.5 tablets (60 mg total) by mouth daily. 10/19/19 01/17/20 Yes Bhagat, Bhavinkumar, PA  gabapentin (NEURONTIN) 300 MG capsule Take 600 mg by mouth 2  (two) times daily.   Yes [provider]  ibuprofen (ADVIL) 200 MG tablet Take 600 mg by mouth every 3 (three) hours as needed for moderate pain.   Yes [provider]  KLOR-CON M20 20 MEQ tablet TAKE 1 TABLET EVERY DAY Patient taking differently: Take 20 mEq by mouth daily.  12/09/19  Yes [name], Alinda Sierras, MD  METFORMIN HCL PO Take 500 mg by mouth in the morning and at bedtime.    Yes [provider]  metoprolol succinate (TOPROL-XL) 25 MG 24 hr tablet Take 1 tablet (25 mg total) by mouth daily. 08/23/19 12/11/19 Yes Ngetich, Dinah C, NP  nitroGLYCERIN (NITROSTAT) 0.4 MG SL tablet Place 1 tablet (0.4 mg total) under the tongue every 5 (five) minutes as needed. Chest pain 01/20/14  Yes Burnell Blanks, MD  pantoprazole (PROTONIX) 40 MG tablet TAKE 1 TABLET BY MOUTH EVERY DAY Patient taking differently: Take 40 mg by mouth daily.  11/24/19  Yes [name], Alinda Sierras, MD  polyethylene glycol (MIRALAX / GLYCOLAX) 17 g packet Take 17 g by mouth daily as needed for mild constipation.    Yes [provider]  pramipexole (MIRAPEX) 1.5 MG tablet TAKE 2 TABLETS BY MOUTH EVERY EVENING Patient taking differently: Take 3 mg by mouth at bedtime.  10/29/19  Yes [name], Alinda Sierras, MD  warfarin (COUMADIN) 5 MG tablet Take 1 tablet (5 mg total) by mouth daily. 08/23/19  Yes Ngetich, Dinah C, NP  Acetaminophen-Codeine 300-30 MG tablet Take 1 tablet by mouth See admin instructions. 4-5 times per day 12/07/19   [provider]  magic mouthwash w/lidocaine SOLN Take 5 mLs by mouth 3 (three) times daily as needed. Patient not taking: Reported on 12/11/2019 11/03/19   Dorothyann Peng, NP  nystatin (MYCOSTATIN/NYSTOP) powder APPLY TO AFFECTED AREA 4 TIMES A DAY Patient not taking: Reported on 12/11/2019 08/23/19   Ngetich, Dinah C, NP  nystatin-triamcinolone ointment (MYCOLOG) Apply 1 application topically 2 (two) times daily. Patient not taking: Reported on 12/11/2019 08/23/19    Ngetich, Dinah C, NP    Allergies    Latex, Ace inhibitors, Codeine, and Penicillins  Review of Systems   Review of Systems  All other systems reviewed  and are negative.   Physical Exam Updated Vital Signs BP 140/72   Pulse 93   Temp (!) 97.5 F (36.4 C) (Oral)   Resp 16   Ht 1.778 m (5\' 10" )   Wt 99.8 kg   SpO2 100%   BMI 31.57 kg/m   Physical Exam Vitals and nursing note reviewed.  Constitutional:      General: He is not in acute distress.    Appearance: Normal appearance. He is well-developed. He is not toxic-appearing.  HENT:     Head: Normocephalic and atraumatic.  Eyes:     General: Lids are normal.     Conjunctiva/sclera: Conjunctivae normal.     Pupils: Pupils are equal, round, and reactive to light.  Neck:     Thyroid: No thyroid mass.     Trachea: No tracheal deviation.  Cardiovascular:     Rate and Rhythm: Normal rate and regular rhythm.     Heart sounds: Normal heart sounds. No murmur heard.  No gallop.   Pulmonary:     Effort: Pulmonary effort is normal. No respiratory distress.     Breath sounds: Normal breath sounds. No stridor. No decreased breath sounds, wheezing, rhonchi or rales.  Abdominal:     General: Bowel sounds are normal. There is no distension.     Palpations: Abdomen is soft.     Tenderness: There is no abdominal tenderness. There is no rebound.  Musculoskeletal:        General: No tenderness. Normal range of motion.     Cervical back: Normal range of motion and neck supple.     Comments: 3+ bilateral extremity pitting edema  Skin:    General: Skin is warm and dry.     Findings: No abrasion or rash.  Neurological:     General: No focal deficit present.     Mental Status: He is alert and oriented to person, place, and time.     GCS: GCS eye subscore is 4. GCS verbal subscore is 5. GCS motor subscore is 6.     Cranial Nerves: Cranial nerves are intact. No cranial nerve deficit.     Sensory: No sensory deficit.     Motor: Motor  function is intact.  Psychiatric:        Speech: Speech normal.        Behavior: Behavior normal.     ED Results / Procedures / Treatments   Labs (all labs ordered are listed, but only abnormal results are displayed) Labs Reviewed  BASIC METABOLIC PANEL - Abnormal; Notable for the following components:      Result Value   Glucose, Bld 109 (*)    BUN 40 (*)    Creatinine, Ser 1.92 (*)    GFR, Estimated 34 (*)    All other components within normal limits  CBC - Abnormal; Notable for the following components:   RBC 3.25 (*)    Hemoglobin 10.0 (*)    HCT 31.7 (*)    All other components within normal limits  URINALYSIS, ROUTINE W REFLEX MICROSCOPIC - Abnormal; Notable for the following components:   APPearance HAZY (*)    Hgb urine dipstick SMALL (*)    Protein, ur 30 (*)    Nitrite POSITIVE (*)    Leukocytes,Ua LARGE (*)    Bacteria, UA FEW (*)    All other components within normal limits  CBG MONITORING, ED - Abnormal; Notable for the following components:   Glucose-Capillary 101 (*)    All other  components within normal limits  CBG MONITORING, ED    EKG EKG Interpretation  Date/Time:  Friday December 10 2019 18:23:09 EST Ventricular Rate:  83 PR Interval:  154 QRS Duration: 146 QT Interval:  412 QTC Calculation: 484 R Axis:   -110 Text Interpretation: Normal sinus rhythm Right bundle branch block Septal infarct , age undetermined Possible Lateral infarct , age undetermined Abnormal ECG When compared with ECG of 08/08/2019, No significant change was found Confirmed by Delora Fuel ([id number]) on 12/11/2019 3:30:15 AM   Radiology No results found.  Procedures Procedures (including critical care time)  Medications Ordered in ED Medications  sodium chloride 0.9 % bolus 500 mL (has no administration in time range)  0.9 %  sodium chloride infusion (has no administration in time range)  acetaminophen (TYLENOL) tablet 650 mg (650 mg Oral Given 12/10/19 2225)    ED Course   I have reviewed the triage vital signs and the nursing notes.  Pertinent labs & imaging results that were available during my care of the patient were reviewed by me and considered in my medical decision making (see chart for details).    MDM Rules/Calculators/A&P                          Patient has indwelling Foley catheter which explains his urinalysis results.  Patient's renal function noted and slightly worse than normal however I think he was dehydrated and he was given IV fluids.  His delta troponin was negative.  Patient feels back to his baseline and will be discharged home Final Clinical Impression(s) / ED Diagnoses Final diagnoses:  None    Rx / DC Orders ED Discharge Orders    None       Lacretia Leigh, MD 12/11/19 1416

## 2019-12-11 NOTE — Discharge Instructions (Addendum)
Follow-up with your doctor next week.  Return here if you should pass out

## 2019-12-11 NOTE — ED Notes (Signed)
Pt given apple sauce and ginger ale.

## 2019-12-13 ENCOUNTER — Other Ambulatory Visit: Payer: Self-pay

## 2019-12-13 ENCOUNTER — Telehealth: Payer: Self-pay | Admitting: Family Medicine

## 2019-12-13 NOTE — Telephone Encounter (Signed)
If having recurrent dizziness set up follow up.

## 2019-12-13 NOTE — Telephone Encounter (Signed)
Sorry son was calling to see what they should do about the pts spells that are occurring if the pt should be seen or what needs to be done at this point.

## 2019-12-13 NOTE — Telephone Encounter (Signed)
Is there a question or is this an FYI??  I was aware of his admission.  Very tough situation with both (parents) of their health issues.

## 2019-12-13 NOTE — Telephone Encounter (Signed)
I think just an FYI - he was just released over the weekend.

## 2019-12-13 NOTE — Telephone Encounter (Signed)
Pts son is calling in stating that the pt went to the ER and was there 20 1/2 hrs for the pt having a spell and was Dx: dehydration.  And on Tuesday he had 13 teeth pulled and not sure if this is something that may have contribute to it.  Pt is in a lot of stress taking care of a spouse that had alzheimer.  OTC pain 3 tylenol  3 hrs 3 ibuprofen (with alternation every 3 hrs)  Tues Wed Thurs and put him on 2 pills of tylenol 3 hrs ibuprofen (with alternation every 3 hrs)

## 2019-12-14 ENCOUNTER — Telehealth: Payer: Self-pay | Admitting: Family Medicine

## 2019-12-14 ENCOUNTER — Ambulatory Visit (INDEPENDENT_AMBULATORY_CARE_PROVIDER_SITE_OTHER): Payer: Medicare Other | Admitting: General Practice

## 2019-12-14 DIAGNOSIS — I480 Paroxysmal atrial fibrillation: Secondary | ICD-10-CM

## 2019-12-14 LAB — POCT INR: INR: 2.7 (ref 2.0–3.0)

## 2019-12-14 NOTE — Progress Notes (Signed)
Medical screening examination/treatment/procedure(s) were performed by non-physician practitioner and as supervising physician I was immediately available for consultation/collaboration. I agree with above. Chene Kasinger, MD   

## 2019-12-14 NOTE — Telephone Encounter (Signed)
[facility] w/[facility] is call to get verbal orders for a Medical Social worker for community resources to help the the pts spouse.  Can leave detail msg if no answer on the secured voice mail.

## 2019-12-14 NOTE — Telephone Encounter (Signed)
Patient's son could only bring him in tomorrow - I scheduled them for 11:45.

## 2019-12-14 NOTE — Patient Instructions (Signed)
Pre visit review using our clinic review tool, if applicable. No additional management support is needed unless otherwise documented below in the visit note.  Continue to take 1 tablet daily except 1/2 tablet on Monday and Fridays.  Re-check in 2 weeks.  Dosing instructions given to [name] @ [facility].  [phone].

## 2019-12-15 ENCOUNTER — Other Ambulatory Visit: Payer: Self-pay

## 2019-12-15 ENCOUNTER — Encounter: Payer: Self-pay | Admitting: Family Medicine

## 2019-12-15 ENCOUNTER — Ambulatory Visit: Payer: Medicare Other | Admitting: Family Medicine

## 2019-12-15 VITALS — BP 121/64 | HR 85 | Ht 70.0 in | Wt 220.0 lb

## 2019-12-15 DIAGNOSIS — R42 Dizziness and giddiness: Secondary | ICD-10-CM | POA: Diagnosis not present

## 2019-12-15 DIAGNOSIS — I872 Venous insufficiency (chronic) (peripheral): Secondary | ICD-10-CM

## 2019-12-15 DIAGNOSIS — F321 Major depressive disorder, single episode, moderate: Secondary | ICD-10-CM

## 2019-12-15 MED ORDER — ESCITALOPRAM OXALATE 10 MG PO TABS: ORAL_TABLET | ORAL | 2 refills | Status: DC

## 2019-12-15 MED ORDER — POTASSIUM CHLORIDE CRYS ER 20 MEQ PO TBCR: 20.0000 meq | EXTENDED_RELEASE_TABLET | Freq: Every day | ORAL | 1 refills | Status: AC

## 2019-12-15 NOTE — Telephone Encounter (Signed)
OK to proceed with verbal orders for social work consult.

## 2019-12-15 NOTE — Progress Notes (Signed)
Established Patient Office Visit  Subjective:  Patient ID: Gary Yates., male    DOB: 12-14-1933  Age: 86 y.o. MRN: [id number]  CC:  Chief Complaint  Patient presents with  . Dizziness    HPI Gary Yates. presents for medical follow-up to discuss several things.  He recently on Tuesday 9 days ago had 13 teeth pulled.  He was placed briefly on some pain medication.  He had some decreased intake of fluids and food for few days afterwards.  He experienced some increased dizziness and on Friday went to the ER.  He had several tests done.  His labs revealed some likely dehydration.  He did have some nitrites and leukocytes on urine but culture was not done.  This was a bagged specimen.  He is very likely colonized.  There were no recent reported fever.  His hemoglobin and white count were normal.  He received IV fluids and felt better afterwards.  He apparently did not have any major orthostatic change in blood pressure.  He has chronic peripheral edema which has been stable on Lasix.  He needs refills of his potassium.  He does not describe any consistent orthostatic symptoms but does frequently feel off balance.  He has peripheral neuropathy which complicates things and puts him at high risk for falls.  Dealing with tremendous stress with wife with progressive dementia.  She is getting more confused with time.  His children been very involved in helping care for both of them.  They called yesterday requesting social work consult.  They are looking at trying to maximize home services.  They have daytime help but he is there alone with her at night.  He has had some increased anxiety symptoms recently and also depressed mood.  Frequent crying spells.  Feelings of hopelessness.  Decreased motivation.  No suicidal ideation.  Chronic poor sleep.  Recent poor appetite.  Past Medical History:  Diagnosis Date  . Anemia in chronic renal disease 05/07/2017  . Anxiety   . Atrial fibrillation  ([place])   . COPD (chronic obstructive pulmonary disease) ([place])    pt. denies  . Coronary artery disease    a. h/o Overlapping stents RCA;  b. 06/2011 Cath: patent stents, nonobs dzs, NL EF.  . Diabetic peripheral neuropathy ([place])   . Diffuse non-Hodgkin's lymphoma of testis ([place]) 09/28/2015  . DM (diabetes mellitus) ([place])    Type 2, peripheral neuropathy.  . Dyspnea    with exertion  . Dysrhythmia   . GERD (gastroesophageal reflux disease)   . Headache   . History of bronchitis   . History of kidney stones   . History of radiation therapy 02/19/16 - 03/13/16   Testis/Scrotum: 32.4 Gy in 18 fractions  . History of radiation therapy 08/07/16-08/20/16   left adrenal gland mass treated to 30 Gy in 10 fractions  . Hyperlipidemia   . Hypertension   . Iron deficiency anemia due to chronic blood loss 08/08/2017  . Low testosterone   . Nephrolithiasis   . OSA (obstructive sleep apnea) 11/26/2017  . Osteoarthritis    shoulder  . Restless leg   . SVT (supraventricular tachycardia) ([place])   . Urinary frequency   . Wears partial dentures    upper and lower    Past Surgical History:  Procedure Laterality Date  . APPENDECTOMY    . [place]  . CARDIAC CATHETERIZATION  01/2013  . CATARACT EXTRACTION, BILATERAL    .  CHOLECYSTECTOMY    . COLONOSCOPY    . CORONARY ANGIOPLASTY  2004  . CYSTOSCOPY N/A 08/18/2017   Procedure: CYSTOSCOPY WITH FULGURATION AND SUPRA PUBIC TUBE PLACEMENT;  Surgeon: Kathie Rhodes, MD;  Location: [facility];  Service: Urology;  Laterality: N/A;  . EYE SURGERY Bilateral    cataracts  . IR GENERIC HISTORICAL  10/05/2015   IR US GUIDE VASC ACCESS RIGHT 10/05/2015 Marybelle Killings, MD [facility]  . IR GENERIC HISTORICAL  10/05/2015   IR FLUORO GUIDE PORT INSERTION RIGHT 10/05/2015 Marybelle Killings, MD [facility]  . LEFT HEART CATHETERIZATION WITH CORONARY ANGIOGRAM N/A 06/18/2011   Procedure: LEFT HEART CATHETERIZATION WITH CORONARY ANGIOGRAM;  Surgeon: Peter M Martinique, MD;   Location: [facility] CATH LAB;  Service: Cardiovascular;  Laterality: N/A;  . LEFT HEART CATHETERIZATION WITH CORONARY ANGIOGRAM N/A 01/27/2013   Procedure: LEFT HEART CATHETERIZATION WITH CORONARY ANGIOGRAM;  Surgeon: Burnell Blanks, MD;  [facility] LAB;  Service: Cardiovascular;  Laterality: N/A;  . LUMBAR LAMINECTOMY/DECOMPRESSION MICRODISCECTOMY N/A 02/07/2015   Procedure: Lumbar three-Sacral one Decompression;  Surgeon: Kevan Ny Ditty, MD;  Location: [facility]URO ORS;  Service: Neurosurgery;  Laterality: N/A;  L3 to S1 Decompression  . MULTIPLE TOOTH EXTRACTIONS    . ORCHIECTOMY Right 09/01/2015   Procedure: RIGHT ORCHIECTOMY;  Surgeon: Kathie Rhodes, MD;  Location: [facility];  Service: Urology;  Laterality: Right;  . port a cath in place     . ROTATOR CUFF REPAIR Left     Family History  Problem Relation Age of Onset  . Alzheimer's disease Mother   . Heart disease Mother   . Heart disease Father   . Migraines Father   . Ulcers Father   . Prostate cancer Brother   . Coronary artery disease Other        Male 1st degree relative <50  . Coronary artery disease Other        male 1st degree relative <60  . Heart disease Sister   . Obesity Sister        Morbid  . Arthritis Sister   . Heart disease Brother   . Arthritis Brother   . Sleep apnea Son   . Obesity Son   . Migraines Daughter   . Thyroid disease Daughter     Social History   Socioeconomic History  . Marital status: Married    Spouse name: Not on file  . Number of children: 3  . Years of education: Not on file  . Highest education level: Not on file  Occupational History  . Occupation: Retired    Fish farm manager: RETIRED  Tobacco Use  . Smoking status: Former Smoker    Packs/day: 1.50    Years: 20.00    Pack years: 30.00    Types: Cigarettes    Quit date: 04/05/1978    Years since quitting: 41.7  . Smokeless tobacco: Never Used  Vaping Use  . Vaping Use: Never used  Substance and Sexual Activity  .  Alcohol use: No  . Drug use: No  . Sexual activity: Yes    Birth control/protection: None    Comment: Married  Other Topics Concern  . Not on file  Social History Narrative  . Not on file   Social Determinants of Health   Financial Resource Strain: Low Risk   . Difficulty of Paying Living Expenses: Not hard at all  Food Insecurity: No Food Insecurity  . Worried About Charity fundraiser in the Last Year: Never true  .  Ran Out of Food in the Last Year: Never true  Transportation Needs: No Transportation Needs  . Lack of Transportation (Medical): No  . Lack of Transportation (Non-Medical): No  Physical Activity: Insufficiently Active  . Days of Exercise per Week: 2 days  . Minutes of Exercise per Session: 30 min  Stress: No Stress Concern Present  . Feeling of Stress : Not at all  Social Connections: Moderately Integrated  . Frequency of Communication with Friends and Family: More than three times a week  . Frequency of Social Gatherings with Friends and Family: More than three times a week  . Attends Religious Services: More than 4 times per year  . Active Member of Clubs or Organizations: No  . Attends Archivist Meetings: Never  . Marital Status: Married  Human resources officer Violence: Not At Risk  . Fear of Current or Ex-Partner: No  . Emotionally Abused: No  . Physically Abused: No  . Sexually Abused: No    Outpatient Medications Prior to Visit  Medication Sig Dispense Refill  . acetaminophen (TYLENOL) 500 MG tablet Take 1,000 mg by mouth every 6 (six) hours as needed (for pain/fever/headaches.).     Marland Kitchen acetaminophen (TYLENOL) 500 MG tablet Take 1,000 mg by mouth every 3 (three) hours as needed for moderate pain.    Marland Kitchen atorvastatin (LIPITOR) 40 MG tablet TAKE 1 TABLET BY MOUTH EVERY DAY (Patient taking differently: Take 40 mg by mouth daily. ) 90 tablet 1  . chlorhexidine (PERIDEX) 0.12 % solution Use as directed 15 mLs in the mouth or throat 2 (two) times daily.      Marland Kitchen docusate sodium (COLACE) 50 MG capsule Take 1 capsule (50 mg total) by mouth 2 (two) times daily as needed (for constipation). 90 capsule 0  . furosemide (LASIX) 40 MG tablet Take 1.5 tablets (60 mg total) by mouth daily. 135 tablet 1  . gabapentin (NEURONTIN) 300 MG capsule Take 600 mg by mouth 2 (two) times daily.    Marland Kitchen ibuprofen (ADVIL) 200 MG tablet Take 600 mg by mouth every 3 (three) hours as needed for moderate pain.    Marland Kitchen METFORMIN HCL PO Take 500 mg by mouth in the morning and at bedtime.     . metoprolol succinate (TOPROL-XL) 25 MG 24 hr tablet Take 1 tablet (25 mg total) by mouth daily. 30 tablet 0  . nitroGLYCERIN (NITROSTAT) 0.4 MG SL tablet Place 1 tablet (0.4 mg total) under the tongue every 5 (five) minutes as needed. Chest pain 25 tablet 6  . pantoprazole (PROTONIX) 40 MG tablet TAKE 1 TABLET BY MOUTH EVERY DAY (Patient taking differently: Take 40 mg by mouth daily. ) 30 tablet 11  . polyethylene glycol (MIRALAX / GLYCOLAX) 17 g packet Take 17 g by mouth daily as needed for mild constipation.     . pramipexole (MIRAPEX) 1.5 MG tablet TAKE 2 TABLETS BY MOUTH EVERY EVENING (Patient taking differently: Take 3 mg by mouth at bedtime. ) 180 tablet 1  . warfarin (COUMADIN) 5 MG tablet Take 1 tablet (5 mg total) by mouth daily. 30 tablet 0  . Acetaminophen-Codeine 300-30 MG tablet Take 1 tablet by mouth See admin instructions. 4-5 times per day    . KLOR-CON M20 20 MEQ tablet TAKE 1 TABLET EVERY DAY (Patient taking differently: Take 20 mEq by mouth daily. ) 90 tablet 1  . magic mouthwash w/lidocaine SOLN Take 5 mLs by mouth 3 (three) times daily as needed. (Patient not taking: Reported on 12/11/2019)  180 mL 0  . nystatin (MYCOSTATIN/NYSTOP) powder APPLY TO AFFECTED AREA 4 TIMES A DAY (Patient not taking: Reported on 12/11/2019) 60 g 0  . nystatin-triamcinolone ointment (MYCOLOG) Apply 1 application topically 2 (two) times daily. (Patient not taking: Reported on 12/11/2019) 60 g 0   No  facility-administered medications prior to visit.    Allergies  Allergen Reactions  . Latex Other (See Comments)    Reddens the skin  . Ace Inhibitors Cough  . Codeine Nausea Only and Rash       . Penicillins Rash    Childhood allergy Has patient had a PCN reaction causing immediate rash, facial/tongue/throat swelling, SOB or lightheadedness with hypotension: Yes Has patient had a PCN reaction causing severe rash involving mucus membranes or skin necrosis: Yes Has patient had a PCN reaction that required hospitalization No Has patient had a PCN reaction occurring within the last 10 years: No If all of the above answers are "NO", then may proceed with Cephalosporin use.     ROS Review of Systems  Constitutional: Negative for chills and fever.  Respiratory: Negative for cough.   Cardiovascular: Negative for chest pain.  Genitourinary: Negative for dysuria.  Neurological: Positive for dizziness. Negative for seizures, syncope and speech difficulty.  Psychiatric/Behavioral: Negative for agitation and confusion.      Objective:    Physical Exam Vitals reviewed.  Cardiovascular:     Rate and Rhythm: Normal rate and regular rhythm.  Pulmonary:     Effort: Pulmonary effort is normal.     Breath sounds: Normal breath sounds.  Musculoskeletal:     Comments: He has compression hose on bilaterally.  These were not removed today  Neurological:     Mental Status: He is alert.  Psychiatric:     Comments: Crying off and on during interview.  Slightly depressed mood.  PHQ-9 equals 14     BP 121/64   Pulse 85   Ht _0  (1.778 m)   Wt 220 lb (99.8 kg)   BMI 31.57 kg/m  Wt Readings from Last 3 Encounters:  12/15/19 220 lb (99.8 kg)  12/10/19 220 lb (99.8 kg)  12/01/19 223 lb (101.2 kg)     Health Maintenance Due  Topic Date Due  . FOOT EXAM  08/06/2014  . OPHTHALMOLOGY EXAM  07/23/2019    There are no preventive care reminders to display for this patient.  Lab  Results  Component Value Date   TSH 4.795 (H) 08/05/2018   Lab Results  Component Value Date   WBC 6.2 12/10/2019   HGB 10.0 (L) 12/10/2019   HCT 31.7 (L) 12/10/2019   MCV 97.5 12/10/2019   PLT 159 12/10/2019   Lab Results  Component Value Date   NA 137 12/10/2019   K 4.3 12/10/2019   CHLORIDE 104 12/14/2015   CO2 24 12/10/2019   GLUCOSE 109 (H) 12/10/2019   BUN 40 (H) 12/10/2019   CREATININE 1.92 (H) 12/10/2019   BILITOT 0.4 12/01/2019   ALKPHOS 66 12/01/2019   AST 12 (L) 12/01/2019   ALT <5 12/01/2019   PROT 7.5 12/01/2019   ALBUMIN 4.0 12/01/2019   CALCIUM 9.1 12/10/2019   ANIONGAP 13 12/10/2019   EGFR 76 (L) 12/14/2015   GFR 38.06 (L) 03/22/2019   Lab Results  Component Value Date   CHOL 166 03/22/2019   Lab Results  Component Value Date   HDL 29.80 (L) 03/22/2019   Lab Results  Component Value Date   LDLCALC 53 09/15/2017  Lab Results  Component Value Date   TRIG 373.0 (H) 03/22/2019   Lab Results  Component Value Date   CHOLHDL 6 03/22/2019   Lab Results  Component Value Date   HGBA1C 6.6 (H) 10/11/2019      Assessment & Plan:   #1 recent dizziness.  Blood pressure today left arm seated 105/60 and standing 110/60.  No orthostatic drop.  Recent dizziness improved with IV fluids.  Suspect he became dehydrated after having teeth pulled.  He really does not take any blood pressure medications per se.  Is on furosemide.  #2 bilateral leg edema.  Currently stable on furosemide.  Recent electrolytes stable  -Refill potassium to take daily with his furosemide  #3 increased anxiety and depression symptoms.  Major depressive episode at least moderate severity.  No suicidal ideation.  -Discussed starting Lexapro 10 mg 1/2 tablet daily for 1 week and then titrate to 1 tablet daily.  Reassess in 4 weeks  #4 social-very stressful situation with his wife who has advanced dementia.  He is also physically getting more debilitated over time.  Social work  consult was placed yesterday to look at additional services.  We had made palliative care consult in the past.  Meds ordered this encounter  Medications  . potassium chloride SA (KLOR-CON M20) 20 MEQ tablet    Sig: Take 1 tablet (20 mEq total) by mouth daily.    Dispense:  90 tablet    Refill:  1  . escitalopram (LEXAPRO) 10 MG tablet    Sig: Take one half tablet once daily for one week and then increase to one daily.    Dispense:  30 tablet    Refill:  2    Follow-up: Return in about 1 month (around 01/15/2020).    Carolann Littler, MD

## 2019-12-15 NOTE — Telephone Encounter (Signed)
VO given.

## 2019-12-15 NOTE — Patient Instructions (Signed)

## 2019-12-16 ENCOUNTER — Telehealth: Payer: Self-pay | Admitting: Family Medicine

## 2019-12-16 NOTE — Telephone Encounter (Signed)
Pts son is calling in stating that the escitalopram (LEXAPRO) 10 MG that was prescribed on 12/15/2019 was not received by the pharmacy and would like to see if it can be resent to the pharmacy.  Pharm:  [facility] on [address]

## 2019-12-17 MED ORDER — ESCITALOPRAM OXALATE 10 MG PO TABS
ORAL_TABLET | ORAL | 2 refills | Status: DC
Start: 2019-12-17 — End: 2020-05-30

## 2019-12-17 NOTE — Telephone Encounter (Signed)
RX resent to pharmacy.

## 2019-12-20 ENCOUNTER — Ambulatory Visit: Payer: Medicare Other | Admitting: Family Medicine

## 2019-12-28 ENCOUNTER — Other Ambulatory Visit: Payer: Self-pay | Admitting: Family

## 2019-12-28 ENCOUNTER — Ambulatory Visit (INDEPENDENT_AMBULATORY_CARE_PROVIDER_SITE_OTHER): Payer: Medicare Other | Admitting: General Practice

## 2019-12-28 DIAGNOSIS — I480 Paroxysmal atrial fibrillation: Secondary | ICD-10-CM

## 2019-12-28 DIAGNOSIS — I471 Supraventricular tachycardia: Secondary | ICD-10-CM

## 2019-12-28 LAB — POCT INR: INR: 2.4 (ref 2.0–3.0)

## 2019-12-28 NOTE — Patient Instructions (Signed)
Pre visit review using our clinic review tool, if applicable. No additional management support is needed unless otherwise documented below in the visit note.  Continue to take 1 tablet daily except 1/2 tablet on Monday and Fridays.  Re-check in 2 weeks.  Dosing instructions given to [name] @ [facility].  [phone].

## 2019-12-28 NOTE — Progress Notes (Signed)
Medical screening examination/treatment/procedure(s) were performed by non-physician practitioner and as supervising physician I was immediately available for consultation/collaboration. I agree with above. Eveny Anastas, MD   

## 2020-01-05 ENCOUNTER — Ambulatory Visit (INDEPENDENT_AMBULATORY_CARE_PROVIDER_SITE_OTHER): Payer: Medicare Other | Admitting: General Practice

## 2020-01-05 DIAGNOSIS — I480 Paroxysmal atrial fibrillation: Secondary | ICD-10-CM

## 2020-01-05 LAB — POCT INR: INR: 2.6 (ref 2.0–3.0)

## 2020-01-05 NOTE — Patient Instructions (Signed)
Pre visit review using our clinic review tool, if applicable. No additional management support is needed unless otherwise documented below in the visit note.  Continue to take 1 tablet daily except 1/2 tablet on Monday and Fridays.  Re-check in 2 weeks.  Dosing instructions given to Genia Hotter @ [phone].

## 2020-01-06 ENCOUNTER — Telehealth: Payer: Self-pay | Admitting: Family Medicine

## 2020-01-06 DIAGNOSIS — R296 Repeated falls: Secondary | ICD-10-CM

## 2020-01-06 NOTE — Telephone Encounter (Signed)
Almira w/[facility] is calling in stating that the pts rollator brake is not working and they are requesting a rollator walker with seat and please include the Height 5'7" and weight 215 on the script that is to be faxed over to [place] [phone] or [phone]).  If you have any question you can call Almira at [phone].

## 2020-01-08 NOTE — Telephone Encounter (Signed)
Order done

## 2020-01-10 NOTE — Telephone Encounter (Signed)
DME order faxed to [facility] at [phone].

## 2020-01-13 ENCOUNTER — Other Ambulatory Visit: Payer: Self-pay | Admitting: Family

## 2020-01-13 ENCOUNTER — Other Ambulatory Visit: Payer: Self-pay | Admitting: Family Medicine

## 2020-01-13 DIAGNOSIS — I471 Supraventricular tachycardia: Secondary | ICD-10-CM

## 2020-01-13 DIAGNOSIS — E1140 Type 2 diabetes mellitus with diabetic neuropathy, unspecified: Secondary | ICD-10-CM

## 2020-01-13 DIAGNOSIS — I480 Paroxysmal atrial fibrillation: Secondary | ICD-10-CM

## 2020-01-18 ENCOUNTER — Telehealth (INDEPENDENT_AMBULATORY_CARE_PROVIDER_SITE_OTHER): Payer: Medicare Other | Admitting: Family Medicine

## 2020-01-18 DIAGNOSIS — R600 Localized edema: Secondary | ICD-10-CM

## 2020-01-18 DIAGNOSIS — F419 Anxiety disorder, unspecified: Secondary | ICD-10-CM | POA: Diagnosis not present

## 2020-01-18 DIAGNOSIS — F32A Depression, unspecified: Secondary | ICD-10-CM

## 2020-01-18 DIAGNOSIS — I10 Essential (primary) hypertension: Secondary | ICD-10-CM

## 2020-01-18 DIAGNOSIS — J439 Emphysema, unspecified: Secondary | ICD-10-CM | POA: Diagnosis not present

## 2020-01-18 DIAGNOSIS — E1140 Type 2 diabetes mellitus with diabetic neuropathy, unspecified: Secondary | ICD-10-CM

## 2020-01-18 DIAGNOSIS — Z794 Long term (current) use of insulin: Secondary | ICD-10-CM

## 2020-01-18 NOTE — Progress Notes (Signed)
Patient ID: Gary Tindol., male   DOB: 1933-01-16, 87 y.o.   MRN: [id number]  This visit type was conducted due to national recommendations for restrictions regarding the COVID-19 pandemic in an effort to limit this patient's exposure and mitigate transmission in our community.   Virtual Visit via Video Note  I connected with Gary Yates on 01/18/20 at 10:30 AM EST by a video enabled telemedicine application and verified that I am speaking with the correct person using two identifiers.  Location patient: home Location provider:work or home office Persons participating in the virtual visit: patient, provider  I discussed the limitations of evaluation and management by telemedicine and the availability of in person appointments. The patient expressed understanding and agreed to proceed.   HPI: Gary Yates has multiple chronic problems including history of obesity, atrial fibrillation, hypertension, obstructive sleep apnea, COPD, type 2 diabetes with peripheral neuropathy, chronic kidney disease stage III, history of non-Hodgkin lymphoma the test this, restless leg syndrome, chronic bilateral lower extremity edema.  He is seen today accompanied by help from his son.  At last visit we discussed the fact that he is dealing with tremendous stress with his wife who has advanced dementia.  They have recently sought hospice consult for her.  Her dementia apparently has been fairly aggressive and rapidly progressive.  Gary Yates was having some depression symptoms as well as anxiety and we started Lexapro.  Currently on 10 mg daily.  He feels this has helped.  He states he feels "more stable ".  He is very pleased with the results thus far.  They had requested social work consult to help try to get some additional assistance in the home.  They do have social work contact through hospice for his wife.  His leg edema is stable.  He remains on Lasix daily.  Doing daily weights.  Weight this morning 213 pounds which  is stable.  CBG 119 which is stable.  Last A1c revealed well-controlled diabetes.  He has not had any dizziness since last visit.  They would like to consider hospital bed.  He is currently sleeping in a Conservation officer, nature.  He has chronic back pain and is not a surgical candidate.  He requires repositioning which is not possible with a regular bed.  He has had prior history of ulcers involving his lower extremities.  High risk for ulcer.  He also has difficulties getting in and out of a regular bed.   ROS: See pertinent positives and negatives per HPI.  Past Medical History:  Diagnosis Date  . Anemia in chronic renal disease 05/07/2017  . Anxiety   . Atrial fibrillation ([place])   . COPD (chronic obstructive pulmonary disease) ([place])    pt. denies  . Coronary artery disease    a. h/o Overlapping stents RCA;  b. 06/2011 Cath: patent stents, nonobs dzs, NL EF.  . Diabetic peripheral neuropathy ([place])   . Diffuse non-Hodgkin's lymphoma of testis ([place]) 09/28/2015  . DM (diabetes mellitus) ([place])    Type 2, peripheral neuropathy.  . Dyspnea    with exertion  . Dysrhythmia   . GERD (gastroesophageal reflux disease)   . Headache   . History of bronchitis   . History of kidney stones   . History of radiation therapy 02/19/16 - 03/13/16   Testis/Scrotum: 32.4 Gy in 18 fractions  . History of radiation therapy 08/07/16-08/20/16   left adrenal gland mass treated to 30 Gy in 10 fractions  . Hyperlipidemia   .  Hypertension   . Iron deficiency anemia due to chronic blood loss 08/08/2017  . Low testosterone   . Nephrolithiasis   . OSA (obstructive sleep apnea) 11/26/2017  . Osteoarthritis    shoulder  . Restless leg   . SVT (supraventricular tachycardia) ([place])   . Urinary frequency   . Wears partial dentures    upper and lower    Past Surgical History:  Procedure Laterality Date  . APPENDECTOMY    . [place]  . CARDIAC CATHETERIZATION  01/2013  . CATARACT EXTRACTION, BILATERAL     . CHOLECYSTECTOMY    . COLONOSCOPY    . CORONARY ANGIOPLASTY  2004  . CYSTOSCOPY N/A 08/18/2017   Procedure: CYSTOSCOPY WITH FULGURATION AND SUPRA PUBIC TUBE PLACEMENT;  Surgeon: Kathie Rhodes, MD;  Location: [facility];  Service: Urology;  Laterality: N/A;  . EYE SURGERY Bilateral    cataracts  . IR GENERIC HISTORICAL  10/05/2015   IR US GUIDE VASC ACCESS RIGHT 10/05/2015 Marybelle Killings, MD [facility]  . IR GENERIC HISTORICAL  10/05/2015   IR FLUORO GUIDE PORT INSERTION RIGHT 10/05/2015 Marybelle Killings, MD [facility]  . LEFT HEART CATHETERIZATION WITH CORONARY ANGIOGRAM N/A 06/18/2011   Procedure: LEFT HEART CATHETERIZATION WITH CORONARY ANGIOGRAM;  Surgeon: Peter M Martinique, MD;  Location: [facility] CATH LAB;  Service: Cardiovascular;  Laterality: N/A;  . LEFT HEART CATHETERIZATION WITH CORONARY ANGIOGRAM N/A 01/27/2013   Procedure: LEFT HEART CATHETERIZATION WITH CORONARY ANGIOGRAM;  Surgeon: Burnell Blanks, MD;  Location: [facility] CATH LAB;  Service: Cardiovascular;  Laterality: N/A;  . LUMBAR LAMINECTOMY/DECOMPRESSION MICRODISCECTOMY N/A 02/07/2015   Procedure: Lumbar three-Sacral one Decompression;  Surgeon: Kevan Ny Ditty, MD;  Location: [place] NEURO ORS;  Service: Neurosurgery;  Laterality: N/A;  L3 to S1 Decompression  . MULTIPLE TOOTH EXTRACTIONS    . ORCHIECTOMY Right 09/01/2015   Procedure: RIGHT ORCHIECTOMY;  Surgeon: Kathie Rhodes, MD;  Location: [facility];  Service: Urology;  Laterality: Right;  . port a cath in place     . ROTATOR CUFF REPAIR Left     Family History  Problem Relation Age of Onset  . Alzheimer's disease Mother   . Heart disease Mother   . Heart disease Father   . Migraines Father   . Ulcers Father   . Prostate cancer Brother   . Coronary artery disease Other        Male 1st degree relative <50  . Coronary artery disease Other        male 1st degree relative <60  . Heart disease Sister   . Obesity Sister        Morbid  . Arthritis Sister   . Heart disease Brother    . Arthritis Brother   . Sleep apnea Son   . Obesity Son   . Migraines Daughter   . Thyroid disease Daughter     SOCIAL HX: Lives at home currently with his wife who has dementia.  Very supportive son.   Current Outpatient Medications:  .  acetaminophen (TYLENOL) 500 MG tablet, Take 1,000 mg by mouth every 6 (six) hours as needed (for pain/fever/headaches.). , Disp: , Rfl:  .  acetaminophen (TYLENOL) 500 MG tablet, Take 1,000 mg by mouth every 3 (three) hours as needed for moderate pain., Disp: , Rfl:  .  atorvastatin (LIPITOR) 40 MG tablet, Take 1 tablet (40 mg total) by mouth daily., Disp: 90 tablet, Rfl: 0 .  chlorhexidine (PERIDEX) 0.12 % solution, Use as directed  15 mLs in the mouth or throat 2 (two) times daily. , Disp: , Rfl:  .  docusate sodium (COLACE) 50 MG capsule, Take 1 capsule (50 mg total) by mouth 2 (two) times daily as needed (for constipation)., Disp: 90 capsule, Rfl: 0 .  escitalopram (LEXAPRO) 10 MG tablet, Take one half tablet once daily for one week and then increase to one daily., Disp: 30 tablet, Rfl: 2 .  furosemide (LASIX) 40 MG tablet, Take 1.5 tablets (60 mg total) by mouth daily., Disp: 135 tablet, Rfl: 1 .  gabapentin (NEURONTIN) 300 MG capsule, Take 600 mg by mouth 2 (two) times daily., Disp: , Rfl:  .  ibuprofen (ADVIL) 200 MG tablet, Take 600 mg by mouth every 3 (three) hours as needed for moderate pain., Disp: , Rfl:  .  metFORMIN (GLUCOPHAGE) 500 MG tablet, Take 1 tablet (500 mg total) by mouth 2 (two) times daily with a meal., Disp: 180 tablet, Rfl: 0 .  METFORMIN HCL PO, Take 500 mg by mouth in the morning and at bedtime. , Disp: , Rfl:  .  metoprolol succinate (TOPROL-XL) 25 MG 24 hr tablet, Take 1 tablet (25 mg total) by mouth daily., Disp: 30 tablet, Rfl: 0 .  nitroGLYCERIN (NITROSTAT) 0.4 MG SL tablet, Place 1 tablet (0.4 mg total) under the tongue every 5 (five) minutes as needed. Chest pain, Disp: 25 tablet, Rfl: 6 .  pantoprazole (PROTONIX) 40 MG  tablet, TAKE 1 TABLET BY MOUTH EVERY DAY (Patient taking differently: Take 40 mg by mouth daily. ), Disp: 30 tablet, Rfl: 11 .  polyethylene glycol (MIRALAX / GLYCOLAX) 17 g packet, Take 17 g by mouth daily as needed for mild constipation. , Disp: , Rfl:  .  potassium chloride SA (KLOR-CON M20) 20 MEQ tablet, Take 1 tablet (20 mEq total) by mouth daily., Disp: 90 tablet, Rfl: 1 .  pramipexole (MIRAPEX) 1.5 MG tablet, TAKE 2 TABLETS BY MOUTH EVERY EVENING (Patient taking differently: Take 3 mg by mouth at bedtime. ), Disp: 180 tablet, Rfl: 1 .  warfarin (COUMADIN) 5 MG tablet, Take 1 tablet (5 mg total) by mouth daily., Disp: 30 tablet, Rfl: 0  EXAM:  VITALS per patient if applicable:  GENERAL: alert, oriented, appears well and in no acute distress  HEENT: atraumatic, conjunttiva clear, no obvious abnormalities on inspection of external nose and ears  NECK: normal movements of the head and neck  LUNGS: on inspection no signs of respiratory distress, breathing rate appears normal, no obvious gross SOB, gasping or wheezing  CV: no obvious cyanosis  MS: moves all visible extremities without noticeable abnormality  PSYCH/NEURO: pleasant and cooperative, no obvious depression or anxiety, speech and thought processing grossly intact  ASSESSMENT AND PLAN:  Discussed the following assessment and plan:  #1 bilateral leg edema.  Patient needs to sleep with incline and also needs frequent repositioning to reduce risk of ulcer.  Order written for hospital bed for home use.  #2 anxiety and depression symptoms improved with Lexapro -Continue Lexapro 10 mg daily  #3 hypertension stable and goal at goal  #4 bilateral leg edema.  Multifactorial.  Home weights stable.  Continue current dose of Lasix.  Continue potassium replacement  #5 type 2 diabetes.  Recent good control.  Recent CBG stable.     I discussed the assessment and treatment plan with the patient. The patient was provided an  opportunity to ask questions and all were answered. The patient agreed with the plan and demonstrated an understanding  of the instructions.   The patient was advised to call back or seek an in-person evaluation if the symptoms worsen or if the condition fails to improve as anticipated.     Carolann Littler, MD

## 2020-01-19 ENCOUNTER — Inpatient Hospital Stay: Payer: Medicare Other

## 2020-01-19 ENCOUNTER — Inpatient Hospital Stay (HOSPITAL_BASED_OUTPATIENT_CLINIC_OR_DEPARTMENT_OTHER): Payer: Medicare Other | Admitting: Hematology & Oncology

## 2020-01-19 ENCOUNTER — Other Ambulatory Visit: Payer: Self-pay

## 2020-01-19 ENCOUNTER — Telehealth: Payer: Self-pay

## 2020-01-19 ENCOUNTER — Inpatient Hospital Stay: Payer: Medicare Other | Attending: Hematology & Oncology

## 2020-01-19 ENCOUNTER — Encounter: Payer: Self-pay | Admitting: Hematology & Oncology

## 2020-01-19 VITALS — BP 114/70 | HR 71 | Temp 97.7°F | Resp 20 | Wt 213.1 lb

## 2020-01-19 DIAGNOSIS — R600 Localized edema: Secondary | ICD-10-CM | POA: Diagnosis not present

## 2020-01-19 DIAGNOSIS — Z7901 Long term (current) use of anticoagulants: Secondary | ICD-10-CM | POA: Insufficient documentation

## 2020-01-19 DIAGNOSIS — C8589 Other specified types of non-Hodgkin lymphoma, extranodal and solid organ sites: Secondary | ICD-10-CM | POA: Diagnosis present

## 2020-01-19 DIAGNOSIS — Z923 Personal history of irradiation: Secondary | ICD-10-CM | POA: Diagnosis not present

## 2020-01-19 DIAGNOSIS — M549 Dorsalgia, unspecified: Secondary | ICD-10-CM | POA: Insufficient documentation

## 2020-01-19 DIAGNOSIS — D631 Anemia in chronic kidney disease: Secondary | ICD-10-CM

## 2020-01-19 DIAGNOSIS — R531 Weakness: Secondary | ICD-10-CM | POA: Insufficient documentation

## 2020-01-19 DIAGNOSIS — Z7984 Long term (current) use of oral hypoglycemic drugs: Secondary | ICD-10-CM | POA: Diagnosis not present

## 2020-01-19 DIAGNOSIS — D500 Iron deficiency anemia secondary to blood loss (chronic): Secondary | ICD-10-CM

## 2020-01-19 DIAGNOSIS — N289 Disorder of kidney and ureter, unspecified: Secondary | ICD-10-CM | POA: Diagnosis not present

## 2020-01-19 DIAGNOSIS — R634 Abnormal weight loss: Secondary | ICD-10-CM | POA: Diagnosis not present

## 2020-01-19 DIAGNOSIS — N1831 Chronic kidney disease, stage 3a: Secondary | ICD-10-CM

## 2020-01-19 DIAGNOSIS — Z791 Long term (current) use of non-steroidal anti-inflammatories (NSAID): Secondary | ICD-10-CM | POA: Insufficient documentation

## 2020-01-19 DIAGNOSIS — Z79899 Other long term (current) drug therapy: Secondary | ICD-10-CM | POA: Diagnosis not present

## 2020-01-19 DIAGNOSIS — H538 Other visual disturbances: Secondary | ICD-10-CM | POA: Insufficient documentation

## 2020-01-19 LAB — CBC WITH DIFFERENTIAL (CANCER CENTER ONLY)
Abs Immature Granulocytes: 0.06 10*3/uL (ref 0.00–0.07)
Basophils Absolute: 0.1 10*3/uL (ref 0.0–0.1)
Basophils Relative: 1 %
Eosinophils Absolute: 0.2 10*3/uL (ref 0.0–0.5)
Eosinophils Relative: 4 %
HCT: 29.8 % — ABNORMAL LOW (ref 39.0–52.0)
Hemoglobin: 9.8 g/dL — ABNORMAL LOW (ref 13.0–17.0)
Immature Granulocytes: 1 %
Lymphocytes Relative: 26 %
Lymphs Abs: 1.6 10*3/uL (ref 0.7–4.0)
MCH: 31 pg (ref 26.0–34.0)
MCHC: 32.9 g/dL (ref 30.0–36.0)
MCV: 94.3 fL (ref 80.0–100.0)
Monocytes Absolute: 0.5 10*3/uL (ref 0.1–1.0)
Monocytes Relative: 9 %
Neutro Abs: 3.6 10*3/uL (ref 1.7–7.7)
Neutrophils Relative %: 59 %
Platelet Count: 144 10*3/uL — ABNORMAL LOW (ref 150–400)
RBC: 3.16 MIL/uL — ABNORMAL LOW (ref 4.22–5.81)
RDW: 13.8 % (ref 11.5–15.5)
WBC Count: 6.1 10*3/uL (ref 4.0–10.5)
nRBC: 0 % (ref 0.0–0.2)

## 2020-01-19 LAB — CMP (CANCER CENTER ONLY)
ALT: 6 U/L (ref 0–44)
AST: 15 U/L (ref 15–41)
Albumin: 3.9 g/dL (ref 3.5–5.0)
Alkaline Phosphatase: 88 U/L (ref 38–126)
Anion gap: 8 (ref 5–15)
BUN: 28 mg/dL — ABNORMAL HIGH (ref 8–23)
CO2: 29 mmol/L (ref 22–32)
Calcium: 9.3 mg/dL (ref 8.9–10.3)
Chloride: 99 mmol/L (ref 98–111)
Creatinine: 1.85 mg/dL — ABNORMAL HIGH (ref 0.61–1.24)
GFR, Estimated: 35 mL/min — ABNORMAL LOW (ref >60)
Glucose, Bld: 100 mg/dL — ABNORMAL HIGH (ref 70–99)
Potassium: 4.2 mmol/L (ref 3.5–5.1)
Sodium: 136 mmol/L (ref 135–145)
Total Bilirubin: 0.5 mg/dL (ref 0.3–1.2)
Total Protein: 6.4 g/dL — ABNORMAL LOW (ref 6.5–8.1)

## 2020-01-19 LAB — RETICULOCYTES
Immature Retic Fract: 14.9 % (ref 2.3–15.9)
RBC.: 3.11 MIL/uL — ABNORMAL LOW (ref 4.22–5.81)
Retic Count, Absolute: 41.4 10*3/uL (ref 19.0–186.0)
Retic Ct Pct: 1.3 % (ref 0.4–3.1)

## 2020-01-19 MED ORDER — EPOETIN ALFA-EPBX 40000 UNIT/ML IJ SOLN
40000.0000 [IU] | Freq: Once | INTRAMUSCULAR | Status: AC
  Administered 2020-01-19: 40000 [IU] via SUBCUTANEOUS

## 2020-01-19 MED ORDER — EPOETIN ALFA-EPBX 40000 UNIT/ML IJ SOLN
INTRAMUSCULAR | Status: AC
  Filled 2020-01-19: qty 1

## 2020-01-19 MED ORDER — HEPARIN SOD (PORK) LOCK FLUSH 100 UNIT/ML IV SOLN
500.0000 [IU] | Freq: Once | INTRAVENOUS | Status: AC | PRN
  Administered 2020-01-19: 500 [IU]
  Filled 2020-01-19: qty 5

## 2020-01-19 MED ORDER — SODIUM CHLORIDE 0.9% FLUSH
10.0000 mL | Freq: Once | INTRAVENOUS | Status: AC | PRN
  Administered 2020-01-19: 10 mL
  Filled 2020-01-19: qty 10

## 2020-01-19 NOTE — Telephone Encounter (Signed)
appts made and printed for pt per 01/19/20 los   aom

## 2020-01-19 NOTE — Progress Notes (Signed)
Hematology and Oncology Follow Up Visit  Gary Yates [id number] [birth year] 87 y.o. 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  He continues to lose weight.  I know he wants to lose a little bit of weight.  However, we have to be very careful that he does not lose too much weight and then this causes stress on his heart.  He is still watching over his wife.  She is getting worse.  She has bad dementia.  Thankfully, his family is helping out quite a bit.  He has had no problems with his heart.  He has had no heart failure exacerbations.  He continues on Coumadin.  He has had no bleeding.  There is been no problems with fever.  Thankfully, there is been no issues with COVID.    Overall, his performance status is ECOG 2-3.    Medications:  Current Outpatient Medications:  .  acetaminophen (TYLENOL) 500 MG tablet, Take 1,000 mg by mouth every 6 (six) hours as needed (for pain/fever/headaches.). , Disp: , Rfl:  .  atorvastatin (LIPITOR) 40 MG tablet, Take 1 tablet (40 mg total) by mouth daily., Disp: 90 tablet, Rfl: 0 .  chlorhexidine (PERIDEX) 0.12 % solution, Use as directed 15 mLs in the mouth or throat 2 (two) times daily. , Disp: , Rfl:  .  docusate sodium (COLACE) 50 MG capsule, Take 1 capsule (50 mg total) by mouth 2 (two) times daily as needed (for constipation)., Disp: 90 capsule, Rfl: 0 .  escitalopram (LEXAPRO) 10 MG tablet, Take one half tablet once daily for one week  and then increase to one daily., Disp: 30 tablet, Rfl: 2 .  furosemide (LASIX) 40 MG tablet, Take 60 mg by mouth daily., Disp: , Rfl:  .  gabapentin (NEURONTIN) 300 MG capsule, Take 600 mg by mouth 2 (two) times daily., Disp: , Rfl:  .  metFORMIN (GLUCOPHAGE) 500 MG tablet, Take 1 tablet (500 mg total) by mouth 2 (two) times daily with a meal., Disp: 180 tablet, Rfl: 0 .  pantoprazole (PROTONIX) 40 MG tablet, TAKE 1 TABLET BY MOUTH EVERY DAY (Patient taking differently: Take 40 mg by mouth daily.), Disp: 30 tablet, Rfl: 11 .  polyethylene glycol (MIRALAX / GLYCOLAX) 17 g packet, Take 17 g by mouth daily as needed for mild constipation. , Disp: , Rfl:  .  potassium chloride SA (KLOR-CON M20) 20 MEQ tablet, Take 1 tablet (20 mEq total) by mouth daily., Disp: 90 tablet, Rfl: 1 .  pramipexole (MIRAPEX) 1.5 MG tablet, TAKE 2 TABLETS BY MOUTH EVERY EVENING (Patient taking differently: Take 3 mg by mouth at bedtime.), Disp: 180 tablet, Rfl: 1 .  warfarin (COUMADIN) 5 MG tablet, Take 1 tablet (5 mg total) by mouth daily. (Patient taking differently: Take 5 mg by mouth daily. 01/19/2020 Takes daily except one day he takes 1/2 tab.), Disp: 30  tablet, Rfl: 0 .  furosemide (LASIX) 40 MG tablet, Take 1.5 tablets (60 mg total) by mouth daily., Disp: 135 tablet, Rfl: 1 .  ibuprofen (ADVIL) 200 MG tablet, Take 600 mg by mouth every 3 (three) hours as needed for moderate pain. (Patient not taking: Reported on 01/19/2020), Disp: , Rfl:  .  metoprolol succinate (TOPROL-XL) 25 MG 24 hr tablet, Take 1 tablet (25 mg total) by mouth daily., Disp: 30 tablet, Rfl: 0 .  nitroGLYCERIN (NITROSTAT) 0.4 MG SL tablet, Place 1 tablet (0.4 mg total) under the tongue every 5 (five) minutes as needed. Chest pain (Patient not taking: Reported on 01/19/2020), Disp: 25 tablet, Rfl: 6  Allergies:  Allergies  Allergen Reactions  . Latex Other (See Comments)    Reddens the skin  . Ace Inhibitors Cough  . Codeine Nausea Only and Rash        . Penicillins Rash    Childhood allergy Has patient had a PCN reaction causing immediate rash, facial/tongue/throat swelling, SOB or lightheadedness with hypotension: Yes Has patient had a PCN reaction causing severe rash involving mucus membranes or skin necrosis: Yes Has patient had a PCN reaction that required hospitalization No Has patient had a PCN reaction occurring within the last 10 years: No If all of the above answers are "NO", then may proceed with Cephalosporin use.     Past Medical History, Surgical history, Social history, and Family History were reviewed and updated.  Review of Systems: Review of Systems  HENT: Positive for sinus pain and sore throat.   Eyes: Positive for blurred vision.  Respiratory: Positive for wheezing.   Cardiovascular: Positive for leg swelling.  Gastrointestinal: Negative.   Genitourinary: Negative.   Musculoskeletal: Positive for back pain.  Skin: Negative.   Neurological: Positive for weakness.  Endo/Heme/Allergies: Negative.   Psychiatric/Behavioral: Negative.     Physical Exam:  weight is 213 lb 1.9 oz (96.7 kg). His oral temperature is 97.7 F (36.5 C). His blood pressure is 114/70 and his pulse is 71. His respiration is 20 and oxygen saturation is 100%.   Wt Readings from Last 3 Encounters:  01/19/20 213 lb 1.9 oz (96.7 kg)  12/15/19 220 lb (99.8 kg)  12/10/19 220 lb (99.8 kg)      Physical Exam Vitals reviewed.  HENT:     Head: Normocephalic and atraumatic.  Eyes:     Pupils: Pupils are equal, round, and reactive to light.  Cardiovascular:     Rate and Rhythm: Normal rate and regular rhythm.     Heart sounds: Normal heart sounds.  Pulmonary:     Effort: Pulmonary effort is normal.     Breath sounds: Normal breath sounds.  Abdominal:     General: Bowel sounds are normal.     Palpations: Abdomen is soft.  Musculoskeletal:        General: No tenderness or deformity. Normal range of motion.     Cervical back: Normal  range of motion.  Lymphadenopathy:     Cervical: No cervical adenopathy.  Skin:    General: Skin is warm and dry.     Findings: No erythema or rash.  Neurological:     Mental Status: He is alert and oriented to person, place, and time.  Psychiatric:        Behavior: Behavior normal.        Thought Content: Thought content normal.        Judgment: Judgment normal.      Lab Results  Component  Value Date   WBC 6.1 01/19/2020   HGB 9.8 (L) 01/19/2020   HCT 29.8 (L) 01/19/2020   MCV 94.3 01/19/2020   PLT 144 (L) 01/19/2020     Chemistry      Component Value Date/Time   NA 136 01/19/2020 1334   NA 137 08/18/2019 0000   NA 141 01/10/2017 1136   NA 137 12/14/2015 1100   K 4.2 01/19/2020 1334   K 4.4 01/10/2017 1136   K 3.7 12/14/2015 1100   CL 99 01/19/2020 1334   CL 104 01/10/2017 1136   CO2 29 01/19/2020 1334   CO2 27 01/10/2017 1136   CO2 24 12/14/2015 1100   BUN 28 (H) 01/19/2020 1334   BUN 29 (A) 08/18/2019 0000   BUN 19 01/10/2017 1136   BUN 18.4 12/14/2015 1100   CREATININE 1.85 (H) 01/19/2020 1334   CREATININE 1.76 (H) 10/08/2019 0100   CREATININE 0.9 12/14/2015 1100   GLU 122 08/18/2019 0000      Component Value Date/Time   CALCIUM 9.3 01/19/2020 1334   CALCIUM 9.4 01/10/2017 1136   CALCIUM 9.5 12/14/2015 1100   ALKPHOS 88 01/19/2020 1334   ALKPHOS 112 (H) 01/10/2017 1136   ALKPHOS 102 12/14/2015 1100   AST 15 01/19/2020 1334   AST 22 12/14/2015 1100   ALT 6 01/19/2020 1334   ALT 18 01/10/2017 1136   ALT 10 12/14/2015 1100   BILITOT 0.5 01/19/2020 1334   BILITOT 0.58 12/14/2015 1100      Impression and Plan: Gary Yates is an 87 year old white male. He has relapsed large cell non-Hodgkin lymphoma of the testicle. He was on salvage chemotherapy with Rituxan/bendamustine/Velcade.  He did not really respond well to this.  We then treated him with dose reduced R-ICE.  He responded very well to this.  His last PET scan, which was done in November 2019, showed  that he was in remission.    Today, we we will have to give him some Aranesp.  We will see what his iron studies look like.  We will plan to get him back in 6 weeks.     Volanda Napoleon, MD 1/12/20222:37 PM

## 2020-01-19 NOTE — Patient Instructions (Signed)

## 2020-01-19 NOTE — Patient Instructions (Signed)

## 2020-01-20 ENCOUNTER — Other Ambulatory Visit: Payer: Self-pay

## 2020-01-20 ENCOUNTER — Ambulatory Visit (INDEPENDENT_AMBULATORY_CARE_PROVIDER_SITE_OTHER): Payer: Medicare Other | Admitting: General Practice

## 2020-01-20 DIAGNOSIS — J439 Emphysema, unspecified: Secondary | ICD-10-CM

## 2020-01-20 DIAGNOSIS — I480 Paroxysmal atrial fibrillation: Secondary | ICD-10-CM

## 2020-01-20 DIAGNOSIS — R42 Dizziness and giddiness: Secondary | ICD-10-CM

## 2020-01-20 DIAGNOSIS — F321 Major depressive disorder, single episode, moderate: Secondary | ICD-10-CM

## 2020-01-20 DIAGNOSIS — R296 Repeated falls: Secondary | ICD-10-CM

## 2020-01-20 DIAGNOSIS — R600 Localized edema: Secondary | ICD-10-CM

## 2020-01-20 LAB — POCT INR: INR: 2.3 (ref 2.0–3.0)

## 2020-01-20 LAB — FERRITIN: Ferritin: 775 ng/mL — ABNORMAL HIGH (ref 24–336)

## 2020-01-20 LAB — IRON AND TIBC
Iron: 68 ug/dL (ref 42–163)
Saturation Ratios: 32 % (ref 20–55)
TIBC: 213 ug/dL (ref 202–409)
UIBC: 144 ug/dL (ref 117–376)

## 2020-01-20 NOTE — Patient Instructions (Signed)
Pre visit review using our clinic review tool, if applicable. No additional management support is needed unless otherwise documented below in the visit note.  Continue to take 1 tablet daily except 1/2 tablet on Monday and Fridays.  Re-check in 2 weeks.  Dosing instructions given to Genia Hotter @ [phone].

## 2020-01-20 NOTE — Progress Notes (Signed)
Medical screening examination/treatment/procedure(s) were performed by non-physician practitioner and as supervising physician I was immediately available for consultation/collaboration. I agree with above. Emmilee Reamer, MD   

## 2020-01-26 ENCOUNTER — Other Ambulatory Visit: Payer: Self-pay | Admitting: Family

## 2020-01-26 DIAGNOSIS — I471 Supraventricular tachycardia: Secondary | ICD-10-CM

## 2020-01-26 DIAGNOSIS — I480 Paroxysmal atrial fibrillation: Secondary | ICD-10-CM

## 2020-02-01 ENCOUNTER — Other Ambulatory Visit: Payer: Self-pay | Admitting: Family

## 2020-02-01 DIAGNOSIS — I471 Supraventricular tachycardia: Secondary | ICD-10-CM

## 2020-02-01 DIAGNOSIS — I480 Paroxysmal atrial fibrillation: Secondary | ICD-10-CM

## 2020-02-02 ENCOUNTER — Other Ambulatory Visit: Payer: Self-pay | Admitting: Family Medicine

## 2020-02-02 DIAGNOSIS — I471 Supraventricular tachycardia: Secondary | ICD-10-CM

## 2020-02-02 DIAGNOSIS — I480 Paroxysmal atrial fibrillation: Secondary | ICD-10-CM

## 2020-02-03 ENCOUNTER — Other Ambulatory Visit: Payer: Self-pay | Admitting: Cardiovascular Disease

## 2020-02-03 ENCOUNTER — Ambulatory Visit (INDEPENDENT_AMBULATORY_CARE_PROVIDER_SITE_OTHER): Payer: Medicare Other | Admitting: General Practice

## 2020-02-03 DIAGNOSIS — I480 Paroxysmal atrial fibrillation: Secondary | ICD-10-CM

## 2020-02-03 DIAGNOSIS — I471 Supraventricular tachycardia: Secondary | ICD-10-CM

## 2020-02-03 LAB — POCT INR: INR: 1.7 — AB (ref 2.0–3.0)

## 2020-02-03 NOTE — Progress Notes (Signed)
Medical screening examination/treatment/procedure(s) were performed by non-physician practitioner and as supervising physician I was immediately available for consultation/collaboration. I agree with above. Chaunta Bejarano, MD   

## 2020-02-03 NOTE — Patient Instructions (Addendum)
Pre visit review using our clinic review tool, if applicable. No additional management support is needed unless otherwise documented below in the visit note.  Take extra 1/2 today and then continue to take 1 tablet daily except 1/2 tablet on Monday and Fridays.  Re-check in 1 weeks.  Dosing instructions given to Jerrilyn Cairo, RN @ [phone].

## 2020-02-10 ENCOUNTER — Ambulatory Visit (INDEPENDENT_AMBULATORY_CARE_PROVIDER_SITE_OTHER): Payer: Medicare Other | Admitting: General Practice

## 2020-02-10 DIAGNOSIS — Z7901 Long term (current) use of anticoagulants: Secondary | ICD-10-CM

## 2020-02-10 LAB — POCT INR: INR: 1.6 — AB (ref 2.0–3.0)

## 2020-02-10 NOTE — Patient Instructions (Signed)
Pre visit review using our clinic review tool, if applicable. No additional management support is needed unless otherwise documented below in the visit note.  Take extra 1 1/2 today and 1 tomorrow and then change dosage and take 1 tablet daily except 1/2 tablet on Mondays.   Re-check in 1 weeks.  Dosing instructions given to Kathlee Nations, RN @ [facility] [phone].

## 2020-02-10 NOTE — Progress Notes (Signed)
Medical screening examination/treatment/procedure(s) were performed by non-physician practitioner and as supervising physician I was immediately available for consultation/collaboration. I agree with above. Leondra Cullin, MD   

## 2020-02-22 ENCOUNTER — Ambulatory Visit (INDEPENDENT_AMBULATORY_CARE_PROVIDER_SITE_OTHER): Payer: Medicare Other | Admitting: General Practice

## 2020-02-22 DIAGNOSIS — I480 Paroxysmal atrial fibrillation: Secondary | ICD-10-CM

## 2020-02-22 LAB — POCT INR: INR: 1.3 — AB (ref 2.0–3.0)

## 2020-02-22 NOTE — Progress Notes (Signed)
Medical screening examination/treatment/procedure(s) were performed by non-physician practitioner and as supervising physician I was immediately available for consultation/collaboration. I agree with above. Richrd Kuzniar, MD   

## 2020-02-22 NOTE — Patient Instructions (Signed)
Pre visit review using our clinic review tool, if applicable. No additional management support is needed unless otherwise documented below in the visit note.  Take extra 1 1/2 today and 1 tomorrow and Thursday and then continue to take 1 tablet daily except 1/2 tablet on Mondays.   Re-check in 1 weeks.  Dosing instructions given to Maudie Mercury, RN @ [facility].  [phone].

## 2020-02-28 ENCOUNTER — Encounter: Payer: Self-pay | Admitting: Cardiovascular Disease

## 2020-02-28 ENCOUNTER — Ambulatory Visit: Payer: Medicare Other | Admitting: Cardiovascular Disease

## 2020-02-28 ENCOUNTER — Other Ambulatory Visit: Payer: Self-pay

## 2020-02-28 VITALS — BP 110/60 | HR 60 | Ht 70.0 in | Wt 207.0 lb

## 2020-02-28 DIAGNOSIS — E785 Hyperlipidemia, unspecified: Secondary | ICD-10-CM | POA: Diagnosis not present

## 2020-02-28 DIAGNOSIS — I2510 Atherosclerotic heart disease of native coronary artery without angina pectoris: Secondary | ICD-10-CM

## 2020-02-28 DIAGNOSIS — I5032 Chronic diastolic (congestive) heart failure: Secondary | ICD-10-CM | POA: Diagnosis not present

## 2020-02-28 DIAGNOSIS — I480 Paroxysmal atrial fibrillation: Secondary | ICD-10-CM

## 2020-02-28 NOTE — Progress Notes (Signed)
=   Chief Complaint  Patient presents with  . Follow-up    CAD   History of Present Illness: 87 yo male with history of HTN, hyperlipidemia, morbid obesity, DM, CAD, atrial fibrillation, SVT, chronic diastolic CHF, prior CVA, carotid artery disease and COPD who is here for follow up. He has CAD with remote stenting of the RCA in 2004. He was admitted to [facility] June 2013 with chest pain. Cardiac cath on 06/18/11 with moderate LAD disease with patent RCA stents with no flow limiting lesions. Normal LV function by cath. He was found to be in atrial fibrillation. He was started on Xarelto but changed to coumadin due to cost. Slurred speech in June 2013. Head CT without evidence of CVA. Head MRI with questionable tiny acute infarct posterior left frontal lobe, mild small vessel disease type changes, global atrophy without hydrocephalus. He is known to have very mild bilateral carotid artery disease (last dopplers November 2016). Stress myoview 01/13/13 with small inferior wall defect from base to apex with mild peri-infarct ischemia. LVEF=53%. Cardiac cath 01/27/13 with stable disease. He has been diagnosed with non-Hodgkins lymphoma treated with chemotherapy. He had involvement of his adrenal gland and he had radiation therapy. He has had testicular surgery followed by radiation. Echo June 2019 with LVEF=55-60%. Mild aortic stenosis (mean gradient 8 mmHg). He was admitted to [facility] August 2021 with LE edema and found to have SVT. He was converted to sinus with adenosine and seen by EP who said we could consider SVT ablation if there was recurrence. Echo August 2021 with NOBS=96-28%, grade 1 diastolic dysfunction. He was seen in our office 10/19/19 by Robbie Lis PA-C with c/o LE edema. Lasix was increased. ABI August 2021 with non-compressible vessels.   He is here today for follow up. The patient denies any chest pain, dyspnea, palpitations, orthopnea, PND, dizziness, near syncope or syncope. No change in chronic LE  edema. He is still taking lasix 60 mg daily. He uses compression stockings.    Primary Care Physician: [name], MD   Past Medical History:  Diagnosis Date  . Anemia in chronic renal disease 05/07/2017  . Anxiety   . Atrial fibrillation ([place])   . COPD (chronic obstructive pulmonary disease) ([place])    pt. denies  . Coronary artery disease    a. h/o Overlapping stents RCA;  b. 06/2011 Cath: patent stents, nonobs dzs, NL EF.  . Diabetic peripheral neuropathy ([place])   . 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 DM (diabetes mellitus) ([place])    Type 2, peripheral neuropathy.  . Dyspnea    with exertion  . Dysrhythmia   . GERD (gastroesophageal reflux disease)   . Headache   . History of bronchitis   . History of kidney stones   . History of radiation therapy 02/19/16 - 03/13/16   Testis/Scrotum: 32.4 Gy in 18 fractions  . History of radiation therapy 08/07/16-08/20/16   left adrenal gland mass treated to 30 Gy in 10 fractions  . Hyperlipidemia   . Hypertension   . Iron deficiency anemia due to chronic blood loss 08/08/2017  . Low testosterone   . Nephrolithiasis   . OSA (obstructive sleep apnea) 11/26/2017  . Osteoarthritis    shoulder  . Restless leg   . SVT (supraventricular tachycardia) ([place])   . Urinary frequency   . Wears partial dentures    upper and lower    Past Surgical History:  Procedure Laterality Date  . APPENDECTOMY    .  [place]  . CARDIAC CATHETERIZATION  01/2013  . CATARACT EXTRACTION, BILATERAL    . CHOLECYSTECTOMY    . COLONOSCOPY    . CORONARY ANGIOPLASTY  2004  . CYSTOSCOPY N/A 08/18/2017   Procedure: CYSTOSCOPY WITH FULGURATION AND SUPRA PUBIC TUBE PLACEMENT;  Surgeon: Kathie Rhodes, MD;  Location: [facility]S;  Service: Urology;  Laterality: N/A;  . EYE SURGERY Bilateral    cataracts  . IR GENERIC HISTORICAL  10/05/2015   IR US GUIDE VASC ACCESS RIGHT 10/05/2015 Marybelle Killings, MD [facility]-INTER[facility] RAD  . IR GENERIC HISTORICAL   10/05/2015   IR FLUORO GUIDE PORT INSERTION RIGHT 10/05/2015 Marybelle Killings, MD [facility]  . LEFT HEART CATHETERIZATION WITH CORONARY ANGIOGRAM N/A 06/18/2011   Procedure: LEFT HEART CATHETERIZATION WITH CORONARY ANGIOGRAM;  Surgeon: Peter M Martinique, MD;  Location: [facility] CATH LAB;  Service: Cardiovascular;  Laterality: N/A;  . LEFT HEART CATHETERIZATION WITH CORONARY ANGIOGRAM N/A 01/27/2013   Procedure: LEFT HEART CATHETERIZATION WITH CORONARY ANGIOGRAM;  Surgeon: Burnell Blanks, MD;  Location: [facility] CATH LAB;  Service: Cardiovascular;  Laterality: N/A;  . LUMBAR LAMINECTOMY/DECOMPRESSION MICRODISCECTOMY N/A 02/07/2015   Procedure: Lumbar three-Sacral one Decompression;  Surgeon: Kevan Ny Ditty, MD;  Location: [facility] NEURO ORS;  Service: Neurosurgery;  Laterality: N/A;  L3 to S1 Decompression  . MULTIPLE TOOTH EXTRACTIONS    . ORCHIECTOMY Right 09/01/2015   Procedure: RIGHT ORCHIECTOMY;  Surgeon: Kathie Rhodes, MD;  Location: [facility] ORS;  Service: Urology;  Laterality: Right;  . port a cath in place     . ROTATOR CUFF REPAIR Left     Current Outpatient Medications  Medication Sig Dispense Refill  . acetaminophen (TYLENOL) 500 MG tablet Take 1,000 mg by mouth every 6 (six) hours as needed (for pain/fever/headaches.).     Marland Kitchen atorvastatin (LIPITOR) 40 MG tablet Take 1 tablet (40 mg total) by mouth daily. 90 tablet 0  . chlorhexidine (PERIDEX) 0.12 % solution Use as directed 15 mLs in the mouth or throat 2 (two) times daily.     Marland Kitchen docusate sodium (COLACE) 50 MG capsule Take 1 capsule (50 mg total) by mouth 2 (two) times daily as needed (for constipation). 90 capsule 0  . escitalopram (LEXAPRO) 10 MG tablet Take one half tablet once daily for one week and then increase to one daily. 30 tablet 2  . furosemide (LASIX) 40 MG tablet Take 60 mg by mouth daily.    Marland Kitchen gabapentin (NEURONTIN) 300 MG capsule Take 600 mg by mouth 2 (two) times daily.    Marland Kitchen ibuprofen (ADVIL) 200 MG tablet Take 600 mg by mouth every 3  (three) hours as needed for moderate pain.    . metFORMIN (GLUCOPHAGE) 500 MG tablet Take 1 tablet (500 mg total) by mouth 2 (two) times daily with a meal. 180 tablet 0  . metoprolol succinate (TOPROL-XL) 25 MG 24 hr tablet TAKE 1 TABLET BY MOUTH EVERY DAY 30 tablet 9  . nitroGLYCERIN (NITROSTAT) 0.4 MG SL tablet Place 1 tablet (0.4 mg total) under the tongue every 5 (five) minutes as needed. Chest pain 25 tablet 6  . pantoprazole (PROTONIX) 40 MG tablet TAKE 1 TABLET BY MOUTH EVERY DAY (Patient taking differently: Take 40 mg by mouth daily.) 30 tablet 11  . polyethylene glycol (MIRALAX / GLYCOLAX) 17 g packet Take 17 g by mouth daily as needed for mild constipation.     . potassium chloride SA (KLOR-CON M20) 20 MEQ tablet Take 1 tablet (20 mEq total)  by mouth daily. 90 tablet 1  . pramipexole (MIRAPEX) 1.5 MG tablet TAKE 2 TABLETS BY MOUTH EVERY EVENING (Patient taking differently: Take 3 mg by mouth at bedtime.) 180 tablet 1  . warfarin (COUMADIN) 5 MG tablet Take 1 tablet (5 mg total) by mouth daily. (Patient taking differently: Take 5 mg by mouth daily. 01/19/2020 Takes daily except one day he takes 1/2 tab.) 30 tablet 0   No current facility-administered medications for this visit.    Allergies  Allergen Reactions  . Latex Other (See Comments)    Reddens the skin  . Ace Inhibitors Cough  . Codeine Nausea Only and Rash       . Penicillins Rash    Childhood allergy Has patient had a PCN reaction causing immediate rash, facial/tongue/throat swelling, SOB or lightheadedness with hypotension: Yes Has patient had a PCN reaction causing severe rash involving mucus membranes or skin necrosis: Yes Has patient had a PCN reaction that required hospitalization No Has patient had a PCN reaction occurring within the last 10 years: No If all of the above answers are "NO", then may proceed with Cephalosporin use.     Social History   Socioeconomic History  . Marital status: Married    Spouse  name: Not on file  . Number of children: 3  . Years of education: Not on file  . Highest education level: Not on file  Occupational History  . Occupation: Retired    Fish farm manager: RETIRED  Tobacco Use  . Smoking status: Former Smoker    Packs/day: 1.50    Years: 20.00    Pack years: 30.00    Types: Cigarettes    Quit date: 04/05/1978    Years since quitting: 41.9  . Smokeless tobacco: Never Used  Vaping Use  . Vaping Use: Never used  Substance and Sexual Activity  . Alcohol use: No  . Drug use: No  . Sexual activity: Yes    Birth control/protection: None    Comment: Married  Other Topics Concern  . Not on file  Social History Narrative  . Not on file   Social Determinants of Health   Financial Resource Strain: Low Risk   . Difficulty of Paying Living Expenses: Not hard at all  Food Insecurity: No Food Insecurity  . Worried About Charity fundraiser in the Last Year: Never true  . Ran Out of Food in the Last Year: Never true  Transportation Needs: No Transportation Needs  . Lack of Transportation (Medical): No  . Lack of Transportation (Non-Medical): No  Physical Activity: Insufficiently Active  . Days of Exercise per Week: 2 days  . Minutes of Exercise per Session: 30 min  Stress: No Stress Concern Present  . Feeling of Stress : Not at all  Social Connections: Moderately Integrated  . Frequency of Communication with Friends and Family: More than three times a week  . Frequency of Social Gatherings with Friends and Family: More than three times a week  . Attends Religious Services: More than 4 times per year  . Active Member of Clubs or Organizations: No  . Attends Archivist Meetings: Never  . Marital Status: Married  Human resources officer Violence: Not At Risk  . Fear of Current or Ex-Partner: No  . Emotionally Abused: No  . Physically Abused: No  . Sexually Abused: No    Family History  Problem Relation Age of Onset  . Alzheimer's disease Mother   .  Heart disease Mother   .  Heart disease Father   . Migraines Father   . Ulcers Father   . Prostate cancer Brother   . Coronary artery disease Other        Male 1st degree relative <50  . Coronary artery disease Other        male 1st degree relative <60  . Heart disease Sister   . Obesity Sister        Morbid  . Arthritis Sister   . Heart disease Brother   . Arthritis Brother   . Sleep apnea Son   . Obesity Son   . Migraines Daughter   . Thyroid disease Daughter     Review of Systems:  As stated in the HPI and otherwise negative.   BP 110/60   Pulse 60   Ht 5\' 10"  (1.778 m)   Wt 207 lb (93.9 kg)   SpO2 96%   BMI 29.70 kg/m   Physical Examination: General: Well developed, well nourished, NAD  HEENT: OP clear, mucus membranes moist  SKIN: warm, dry. No rashes. Neuro: No focal deficits  Musculoskeletal: Muscle strength 5/5 all ext  Psychiatric: Mood and affect normal  Neck: No JVD, no carotid bruits, no thyromegaly, no lymphadenopathy.  Lungs:Clear bilaterally, no wheezes, rhonci, crackles Cardiovascular: Regular rate and rhythm. No murmurs, gallops or rubs. Abdomen:Soft. Bowel sounds present. Non-tender.  Extremities: 1+ bilateral lower extremity edema. Pulses are 2 + in the bilateral DP/PT.  Echo August 2021:   1. Left ventricular ejection fraction, by estimation, is 60 to 65%. The  left ventricle has normal function. The left ventricle has no regional  wall motion abnormalities. Left ventricular diastolic parameters are  consistent with Grade I diastolic  dysfunction (impaired relaxation).  2. Right ventricular systolic function is normal. The right ventricular  size is normal. There is normal pulmonary artery systolic pressure.  3. The mitral valve is normal in structure. No evidence of mitral valve  regurgitation. No evidence of mitral stenosis.  4. The aortic valve is normal in structure. Aortic valve regurgitation is  not visualized. Mild to moderate  aortic valve sclerosis/calcification is  present, without any evidence of aortic stenosis.  5. The inferior vena cava is normal in size with greater than 50%  respiratory variability, suggesting right atrial pressure of 3 mmHg.   Cardiac cath 01/27/13: Left main: No obstructive disease.  Left Anterior Descending Artery: Large caliber vessel in the proximal segment with trifurcation in the mid segment into a large septal perforating branch, moderate caliber bifurcating diagonal branch and small to moderate caliber continuation of the LAD. There appears to be a 40-50% stenosis at the trifurcation in the mid LAD. The diagonal branch bifurcates. The superior branch of the diagonal after the bifurcation is small in caliber with 70% stenosis.  Circumflex Artery: Large caliber vessel with large caliber obtuse marginal branch. The proximal and mid vessel has diffuse 30% stenosis, unchanged. The obtuse marginal branch has diffuse 30% stenosis in the proximal segment of the vessel.  Right Coronary Artery: Large caliber dominant vessel with patent proximal and mid stented segment with 20% stent restenosis. Mild plaque in the large caliber PDA.   EKG:  EKG is not ordered today. The ekg ordered today demonstrates   Recent Labs: 08/09/2019: Magnesium 2.2 10/08/2019: Brain Natriuretic Peptide 14 01/19/2020: ALT 6; BUN 28; Creatinine 1.85; Hemoglobin 9.8; Platelet Count 144; Potassium 4.2; Sodium 136   Lipid Panel    Component Value Date/Time   CHOL 166 03/22/2019 0747   CHOL  135 09/15/2017 0953   TRIG 373.0 (H) 03/22/2019 0747   HDL 29.80 (L) 03/22/2019 0747   HDL 37 (L) 09/15/2017 0953   CHOLHDL 6 03/22/2019 0747   VLDL 74.6 (H) 03/22/2019 0747   LDLCALC 53 09/15/2017 0953   LDLDIRECT 70.0 03/22/2019 0747     Wt Readings from Last 3 Encounters:  02/28/20 207 lb (93.9 kg)  01/19/20 213 lb 1.9 oz (96.7 kg)  12/15/19 220 lb (99.8 kg)     Other studies Reviewed: Additional studies/ records that  were reviewed today include: . Review of the above records demonstrates:    Assessment and Plan:   1. CAD without angina: No chest pain. He is known to have moderate CAD wth last cardiac cath in 2015 with stable disease in the LAD and patent RCA stents. Echo August 2021 with normal LV function. Continue statin and beta blocker. He is not on ASA or Plavix since he is on coumadin.  2. Paroxysmal atrial fibrillation: sinus today on exam. Will continue coumadin. He did not want to use Xarelto or Eliquis due to cost. Will continue Toprol.    3. Hyperlipidemia: LDL at goal in March 2021. Will continue statin  5. Chronic diastolic CHF: Still with bilateral LE edema. Likely due to venous insufficiency. Weight is down. Continue Lasix and compression stockings.    5. SVT: No known recurrence since August 2021. No palpitations at home.   Current medicines are reviewed at length with the patient today.  The patient does not have concerns regarding medicines.  The following changes have been made:    Labs/ tests ordered today include:   No orders of the defined types were placed in this encounter.   Disposition:   FU with me in 12 months  Signed, Lauree Chandler, MD 02/28/2020 4:36 PM    [facility] [address], [place], [place]  [address] Phone: [phone]; Fax: [phone]

## 2020-02-28 NOTE — Patient Instructions (Signed)
Medication Instructions:  Your physician recommends that you continue on your current medications as directed. Please refer to the Current Medication list given to you today.  *If you need a refill on your cardiac medications before your next appointment, please call your pharmacy*   Lab Work: none If you have labs (blood work) drawn today and your tests are completely normal, you will receive your results only by: . MyChart Message (if you have MyChart) OR . A paper copy in the mail If you have any lab test that is abnormal or we need to change your treatment, we will call you to review the results.   Testing/Procedures: none   Follow-Up: At [facility], you and your health needs are our priority.  As part of our continuing mission to provide you with exceptional heart care, we have created designated Provider Care Teams.  These Care Teams include your primary Cardiologist (physician) and Advanced Practice Providers (APPs -  Physician Assistants and Nurse Practitioners) who all work together to provide you with the care you need, when you need it.  Your next appointment:   1 year(s)  The format for your next appointment:   In Person  Provider:   You may see Christopher [name], MD or one of the following Advanced Practice Providers on your designated Care Team:    Dayna Dunn, PA-C  Michele Lenze, PA-C    

## 2020-02-29 ENCOUNTER — Ambulatory Visit (INDEPENDENT_AMBULATORY_CARE_PROVIDER_SITE_OTHER): Payer: Medicare Other | Admitting: General Practice

## 2020-02-29 DIAGNOSIS — I480 Paroxysmal atrial fibrillation: Secondary | ICD-10-CM

## 2020-02-29 LAB — POCT INR: INR: 2.4 (ref 2.0–3.0)

## 2020-02-29 NOTE — Progress Notes (Signed)
Medical screening examination/treatment/procedure(s) were performed by non-physician practitioner and as supervising physician I was immediately available for consultation/collaboration. I agree with above. Desteni Piscopo, MD   

## 2020-02-29 NOTE — Patient Instructions (Signed)
Pre visit review using our clinic review tool, if applicable. No additional management support is needed unless otherwise documented below in the visit note.  Continue to take 1 tablet daily except 1/2 tablet on Mondays.   Re-check in 2 weeks.  Dosing instructions given to Maudie Mercury, RN @ [facility].  [phone].

## 2020-03-01 ENCOUNTER — Other Ambulatory Visit: Payer: Self-pay | Admitting: Family Medicine

## 2020-03-06 ENCOUNTER — Inpatient Hospital Stay: Payer: Medicare Other

## 2020-03-06 ENCOUNTER — Other Ambulatory Visit: Payer: Self-pay

## 2020-03-06 ENCOUNTER — Inpatient Hospital Stay (HOSPITAL_BASED_OUTPATIENT_CLINIC_OR_DEPARTMENT_OTHER): Payer: Medicare Other | Admitting: Hematology & Oncology

## 2020-03-06 ENCOUNTER — Inpatient Hospital Stay: Payer: Medicare Other | Attending: Hematology & Oncology

## 2020-03-06 ENCOUNTER — Encounter: Payer: Self-pay | Admitting: Hematology & Oncology

## 2020-03-06 VITALS — BP 115/58 | HR 83 | Temp 97.9°F | Resp 16 | Wt 194.0 lb

## 2020-03-06 DIAGNOSIS — D631 Anemia in chronic kidney disease: Secondary | ICD-10-CM

## 2020-03-06 DIAGNOSIS — F0280 Dementia in other diseases classified elsewhere without behavioral disturbance: Secondary | ICD-10-CM | POA: Diagnosis not present

## 2020-03-06 DIAGNOSIS — C8589 Other specified types of non-Hodgkin lymphoma, extranodal and solid organ sites: Secondary | ICD-10-CM

## 2020-03-06 DIAGNOSIS — Z95828 Presence of other vascular implants and grafts: Secondary | ICD-10-CM

## 2020-03-06 DIAGNOSIS — N289 Disorder of kidney and ureter, unspecified: Secondary | ICD-10-CM | POA: Diagnosis present

## 2020-03-06 DIAGNOSIS — Z7901 Long term (current) use of anticoagulants: Secondary | ICD-10-CM | POA: Insufficient documentation

## 2020-03-06 DIAGNOSIS — Z7984 Long term (current) use of oral hypoglycemic drugs: Secondary | ICD-10-CM | POA: Insufficient documentation

## 2020-03-06 DIAGNOSIS — G309 Alzheimer's disease, unspecified: Secondary | ICD-10-CM | POA: Insufficient documentation

## 2020-03-06 DIAGNOSIS — N1831 Chronic kidney disease, stage 3a: Secondary | ICD-10-CM | POA: Diagnosis not present

## 2020-03-06 DIAGNOSIS — Z79899 Other long term (current) drug therapy: Secondary | ICD-10-CM | POA: Diagnosis not present

## 2020-03-06 DIAGNOSIS — N184 Chronic kidney disease, stage 4 (severe): Secondary | ICD-10-CM

## 2020-03-06 DIAGNOSIS — C8590 Non-Hodgkin lymphoma, unspecified, unspecified site: Secondary | ICD-10-CM | POA: Diagnosis not present

## 2020-03-06 DIAGNOSIS — Z923 Personal history of irradiation: Secondary | ICD-10-CM | POA: Diagnosis not present

## 2020-03-06 DIAGNOSIS — Z9221 Personal history of antineoplastic chemotherapy: Secondary | ICD-10-CM | POA: Diagnosis not present

## 2020-03-06 LAB — CBC WITH DIFFERENTIAL (CANCER CENTER ONLY)
Abs Immature Granulocytes: 0.06 10*3/uL (ref 0.00–0.07)
Basophils Absolute: 0.1 10*3/uL (ref 0.0–0.1)
Basophils Relative: 1 %
Eosinophils Absolute: 0.2 10*3/uL (ref 0.0–0.5)
Eosinophils Relative: 4 %
HCT: 29.3 % — ABNORMAL LOW (ref 39.0–52.0)
Hemoglobin: 9.6 g/dL — ABNORMAL LOW (ref 13.0–17.0)
Immature Granulocytes: 1 %
Lymphocytes Relative: 31 %
Lymphs Abs: 1.7 10*3/uL (ref 0.7–4.0)
MCH: 31.1 pg (ref 26.0–34.0)
MCHC: 32.8 g/dL (ref 30.0–36.0)
MCV: 94.8 fL (ref 80.0–100.0)
Monocytes Absolute: 0.5 10*3/uL (ref 0.1–1.0)
Monocytes Relative: 9 %
Neutro Abs: 3 10*3/uL (ref 1.7–7.7)
Neutrophils Relative %: 54 %
Platelet Count: 129 10*3/uL — ABNORMAL LOW (ref 150–400)
RBC: 3.09 MIL/uL — ABNORMAL LOW (ref 4.22–5.81)
RDW: 13.9 % (ref 11.5–15.5)
WBC Count: 5.5 10*3/uL (ref 4.0–10.5)
nRBC: 0 % (ref 0.0–0.2)

## 2020-03-06 LAB — CMP (CANCER CENTER ONLY)
ALT: 7 U/L (ref 0–44)
AST: 18 U/L (ref 15–41)
Albumin: 3.9 g/dL (ref 3.5–5.0)
Alkaline Phosphatase: 74 U/L (ref 38–126)
Anion gap: 8 (ref 5–15)
BUN: 27 mg/dL — ABNORMAL HIGH (ref 8–23)
CO2: 28 mmol/L (ref 22–32)
Calcium: 9.3 mg/dL (ref 8.9–10.3)
Chloride: 100 mmol/L (ref 98–111)
Creatinine: 1.83 mg/dL — ABNORMAL HIGH (ref 0.61–1.24)
GFR, Estimated: 36 mL/min — ABNORMAL LOW (ref >60)
Glucose, Bld: 106 mg/dL — ABNORMAL HIGH (ref 70–99)
Potassium: 3.9 mmol/L (ref 3.5–5.1)
Sodium: 136 mmol/L (ref 135–145)
Total Bilirubin: 0.4 mg/dL (ref 0.3–1.2)
Total Protein: 6.5 g/dL (ref 6.5–8.1)

## 2020-03-06 LAB — RETICULOCYTES
Immature Retic Fract: 12.5 % (ref 2.3–15.9)
RBC.: 3.12 MIL/uL — ABNORMAL LOW (ref 4.22–5.81)
Retic Count, Absolute: 31.2 10*3/uL (ref 19.0–186.0)
Retic Ct Pct: 1 % (ref 0.4–3.1)

## 2020-03-06 MED ORDER — DARBEPOETIN ALFA 300 MCG/0.6ML IJ SOSY
PREFILLED_SYRINGE | INTRAMUSCULAR | Status: AC
  Filled 2020-03-06: qty 0.6

## 2020-03-06 MED ORDER — HEPARIN SOD (PORK) LOCK FLUSH 100 UNIT/ML IV SOLN
500.0000 [IU] | Freq: Once | INTRAVENOUS | Status: AC
  Administered 2020-03-06: 500 [IU] via INTRAVENOUS
  Filled 2020-03-06: qty 5

## 2020-03-06 MED ORDER — EPOETIN ALFA-EPBX 40000 UNIT/ML IJ SOLN
40000.0000 [IU] | Freq: Once | INTRAMUSCULAR | Status: AC
  Administered 2020-03-06: 40000 [IU] via SUBCUTANEOUS

## 2020-03-06 MED ORDER — SODIUM CHLORIDE 0.9% FLUSH
10.0000 mL | INTRAVENOUS | Status: DC | PRN
  Administered 2020-03-06: 10 mL via INTRAVENOUS
  Filled 2020-03-06: qty 10

## 2020-03-06 MED ORDER — EPOETIN ALFA-EPBX 40000 UNIT/ML IJ SOLN
INTRAMUSCULAR | Status: AC
  Filled 2020-03-06: qty 1

## 2020-03-06 NOTE — Progress Notes (Signed)
Hematology and Oncology Follow Up Visit  Gary Yates [id number] 05-Nov-1933 87 y.o. 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  He actually looks quite good.  He is continuing to lose weight.  He is lost 20 pounds since we last saw him.  He is trying to lose weight.  I think this makes him feel better.  He does not have nearly as much edema in his legs.  He is still watching his poor wife.  She has end-stage Alzheimer's.  She is very thin.  She is really is not eating.  I suspect that she probably will pass away sometime in March.  He is doing well with his heart.  He is on blood thinner.  He seems to be managing any kind of cardiomyopathy very nicely.  His last iron studies that we did on him back in January showed a ferritin of 775 with an iron saturation of 32%.  He is on Coumadin.  He is doing well with Coumadin with no bleeding.  He has an indwelling Foley catheter.  This is draining about 1500 cc of urine a day.  There is been no problems with fever.  Thankfully, there is been no issues with COVID.    Overall, his performance status is ECOG 2-3.    Medications:  Current Outpatient Medications:  .  acetaminophen (TYLENOL) 500 MG tablet, Take 1,000 mg by mouth every 6 (six) hours as needed (for pain/fever/headaches.). , Disp: , Rfl:  .  chlorhexidine (PERIDEX) 0.12 % solution, Use as directed 15 mLs in the mouth or throat 2 (two) times daily. , Disp: , Rfl:  .  docusate  sodium (COLACE) 50 MG capsule, Take 1 capsule (50 mg total) by mouth 2 (two) times daily as needed (for constipation)., Disp: 90 capsule, Rfl: 0 .  escitalopram (LEXAPRO) 10 MG tablet, Take one half tablet once daily for one week and then increase to one daily., Disp: 30 tablet, Rfl: 2 .  furosemide (LASIX) 40 MG tablet, Take 60 mg by mouth daily., Disp: , Rfl:  .  gabapentin (NEURONTIN) 300 MG capsule, Take 600 mg by mouth 2 (two) times daily., Disp: , Rfl:  .  ibuprofen (ADVIL) 200 MG tablet, Take 600 mg by mouth every 3 (three) hours as needed for moderate pain., Disp: , Rfl:  .  metFORMIN (GLUCOPHAGE) 500 MG tablet, Take 1 tablet (500 mg total) by mouth 2 (two) times daily with a meal., Disp: 180 tablet, Rfl: 0 .  metoprolol succinate (TOPROL-XL) 25 MG 24 hr tablet, TAKE 1 TABLET BY MOUTH EVERY DAY, Disp: 30 tablet, Rfl: 9 .  pantoprazole (PROTONIX) 40 MG tablet, TAKE 1 TABLET BY MOUTH EVERY DAY, Disp: 30 tablet, Rfl: 6 .  polyethylene glycol (MIRALAX / GLYCOLAX) 17 g packet, Take 17 g by mouth daily as needed for mild constipation. , Disp: , Rfl:  .  potassium chloride SA (KLOR-CON M20) 20 MEQ tablet, Take 1 tablet (20 mEq total) by mouth daily., Disp: 90 tablet, Rfl: 1 .  pramipexole (MIRAPEX) 1.5 MG tablet, TAKE 2 TABLETS BY MOUTH EVERY EVENING (Patient taking differently: Take 3 mg by mouth at bedtime.), Disp: 180 tablet, Rfl: 1 .  warfarin (COUMADIN) 5 MG tablet, Take 1 tablet (5 mg total) by mouth daily. (Patient taking differently: Take 5 mg by mouth daily. 01/19/2020 Takes daily except one day he takes 1/2 tab.), Disp: 30 tablet, Rfl: 0 .  atorvastatin (LIPITOR) 40 MG tablet, Take 1 tablet (40 mg total) by mouth daily., Disp: 90 tablet, Rfl: 0 .  nitroGLYCERIN (NITROSTAT) 0.4 MG SL tablet, Place 1 tablet (0.4 mg total) under the tongue every 5 (five) minutes as needed. Chest pain (Patient not taking: Reported on 03/06/2020), Disp: 25 tablet, Rfl: 6 No current facility-administered medications  for this visit.  Facility-Administered Medications Ordered in Other Visits:  .  epoetin alfa-epbx (RETACRIT) injection 40,000 Units, 40,000 Units, Subcutaneous, Once, Itzell Bendavid, Rudell Cobb, MD  Allergies:  Allergies  Allergen Reactions  . Latex Other (See Comments)    Reddens the skin  . Ace Inhibitors Cough  . Codeine Nausea Only and Rash       . Penicillins Rash    Childhood allergy Has patient had a PCN reaction causing immediate rash, facial/tongue/throat swelling, SOB or lightheadedness with hypotension: Yes Has patient had a PCN reaction causing severe rash involving mucus membranes or skin necrosis: Yes Has patient had a PCN reaction that required hospitalization No Has patient had a PCN reaction occurring within the last 10 years: No If all of the above answers are "NO", then may proceed with Cephalosporin use.     Past Medical History, Surgical history, Social history, and Family History were reviewed and updated.  Review of Systems: Review of Systems  HENT: Positive for sinus pain and sore throat.   Eyes: Positive for blurred vision.  Respiratory: Positive for wheezing.   Cardiovascular: Positive for leg swelling.  Gastrointestinal: Negative.   Genitourinary: Negative.   Musculoskeletal: Positive for back pain.  Skin: Negative.   Neurological: Positive for weakness.  Endo/Heme/Allergies: Negative.   Psychiatric/Behavioral: Negative.     Physical Exam:  weight is 194 lb (88 kg). His oral temperature is 97.9 F (36.6 C). His blood pressure is 115/58 (abnormal) and his pulse is 83. His respiration is 16 and oxygen saturation is 96%.   Wt Readings from Last 3 Encounters:  03/06/20 194 lb (88 kg)  02/28/20 207 lb (93.9 kg)  01/19/20 213 lb 1.9 oz (96.7 kg)      Physical Exam Vitals reviewed.  HENT:     Head: Normocephalic and atraumatic.  Eyes:     Pupils: Pupils are equal, round, and reactive to light.  Cardiovascular:     Rate and Rhythm: Normal rate and  regular rhythm.     Heart sounds: Normal heart sounds.  Pulmonary:     Effort: Pulmonary effort is normal.     Breath sounds: Normal breath sounds.  Abdominal:     General: Bowel sounds are normal.     Palpations: Abdomen is soft.  Musculoskeletal:        General: No tenderness or deformity. Normal range of motion.     Cervical back: Normal range of motion.  Lymphadenopathy:     Cervical: No cervical adenopathy.  Skin:    General: Skin is warm and dry.     Findings: No erythema  or rash.  Neurological:     Mental Status: He is alert and oriented to person, place, and time.  Psychiatric:        Behavior: Behavior normal.        Thought Content: Thought content normal.        Judgment: Judgment normal.      Lab Results  Component Value Date   WBC 5.5 03/06/2020   HGB 9.6 (L) 03/06/2020   HCT 29.3 (L) 03/06/2020   MCV 94.8 03/06/2020   PLT 129 (L) 03/06/2020     Chemistry      Component Value Date/Time   NA 136 03/06/2020 1540   NA 137 08/18/2019 0000   NA 141 01/10/2017 1136   NA 137 12/14/2015 1100   K 3.9 03/06/2020 1540   K 4.4 01/10/2017 1136   K 3.7 12/14/2015 1100   CL 100 03/06/2020 1540   CL 104 01/10/2017 1136   CO2 28 03/06/2020 1540   CO2 27 01/10/2017 1136   CO2 24 12/14/2015 1100   BUN 27 (H) 03/06/2020 1540   BUN 29 (A) 08/18/2019 0000   BUN 19 01/10/2017 1136   BUN 18.4 12/14/2015 1100   CREATININE 1.83 (H) 03/06/2020 1540   CREATININE 1.76 (H) 10/08/2019 0100   CREATININE 0.9 12/14/2015 1100   GLU 122 08/18/2019 0000      Component Value Date/Time   CALCIUM 9.3 03/06/2020 1540   CALCIUM 9.4 01/10/2017 1136   CALCIUM 9.5 12/14/2015 1100   ALKPHOS 74 03/06/2020 1540   ALKPHOS 112 (H) 01/10/2017 1136   ALKPHOS 102 12/14/2015 1100   AST 18 03/06/2020 1540   AST 22 12/14/2015 1100   ALT 7 03/06/2020 1540   ALT 18 01/10/2017 1136   ALT 10 12/14/2015 1100   BILITOT 0.4 03/06/2020 1540   BILITOT 0.58 12/14/2015 1100      Impression and  Plan: Mr. Mehrer is an 87 year old white male. He has relapsed large cell non-Hodgkin lymphoma of the testicle. He was on salvage chemotherapy with Rituxan/bendamustine/Velcade.  He did not really respond well to this.  We then treated him with dose reduced R-ICE.  He responded very well to this.  His last PET scan, which was done in November 2019, showed that he was in remission.   His problem now is that he has renal insufficiency and anemia secondary to a low erythropoietin level.  We will go ahead and give him some Aranesp.  I will plan to get him back in 1 month.  He does have the Port-A-Cath and that we do flush.     Volanda Napoleon, MD 2/28/20224:13 PM

## 2020-03-06 NOTE — Patient Instructions (Signed)

## 2020-03-07 ENCOUNTER — Telehealth: Payer: Self-pay

## 2020-03-07 LAB — IRON AND TIBC
Iron: 75 ug/dL (ref 42–163)
Saturation Ratios: 35 % (ref 20–55)
TIBC: 216 ug/dL (ref 202–409)
UIBC: 141 ug/dL (ref 117–376)

## 2020-03-07 LAB — FERRITIN: Ferritin: 722 ng/mL — ABNORMAL HIGH (ref 24–336)

## 2020-03-07 NOTE — Telephone Encounter (Signed)
Pt called with los appts for 04/06/20 per 03/06/20 los, there was no answer/no vm, a calendar has been mailed top the pt     anne

## 2020-03-09 ENCOUNTER — Telehealth: Payer: Self-pay | Admitting: Family Medicine

## 2020-03-09 NOTE — Telephone Encounter (Signed)
Stacey from [facility] would like to continue PT with the patient  1 x a week for 8 weeks  Erline Levine [facility] [phone]

## 2020-03-10 NOTE — Telephone Encounter (Signed)
Verbal given for therapy.  Patient has a lot going on at home with his wifes failing health.

## 2020-03-13 ENCOUNTER — Ambulatory Visit (INDEPENDENT_AMBULATORY_CARE_PROVIDER_SITE_OTHER): Payer: Medicare Other | Admitting: General Practice

## 2020-03-13 DIAGNOSIS — I480 Paroxysmal atrial fibrillation: Secondary | ICD-10-CM

## 2020-03-13 LAB — POCT INR: INR: 1.8 — AB (ref 2.0–3.0)

## 2020-03-13 NOTE — Patient Instructions (Signed)
Pre visit review using our clinic review tool, if applicable. No additional management support is needed unless otherwise documented below in the visit note.  Take 1 tablet (5 mg) today and then continue to take 1 tablet daily except 1/2 tablet on Mondays.   Re-check in 2 weeks.  Dosing instructions given to Maudie Mercury, RN @ [facility].  [phone].

## 2020-03-14 ENCOUNTER — Telehealth: Payer: Self-pay | Admitting: Family Medicine

## 2020-03-14 NOTE — Telephone Encounter (Signed)
Noted.  Spoke with patient's daughter.

## 2020-03-14 NOTE — Telephone Encounter (Signed)
Patient's daughter [name] called to get the patient INR results and to know what they need to do.  Please advise

## 2020-03-17 ENCOUNTER — Telehealth: Payer: Self-pay | Admitting: Family Medicine

## 2020-03-17 NOTE — Telephone Encounter (Signed)
Called pt to schedule an office visit. Pt. Declined he stated he just wanted to inform the office of the below.

## 2020-03-17 NOTE — Telephone Encounter (Signed)
Gary Yates is calling in to let us know that the pt had a fall on Monday 03/13/2020 tripped over a cord when trying to look out the window he could get up on his own per pt but a family member wanted to help to get him up and he did not go to the ER and had a little shoulder pain and was advised to give the office a call if things gets worse.

## 2020-03-28 ENCOUNTER — Ambulatory Visit (INDEPENDENT_AMBULATORY_CARE_PROVIDER_SITE_OTHER): Payer: Medicare Other | Admitting: General Practice

## 2020-03-28 DIAGNOSIS — I480 Paroxysmal atrial fibrillation: Secondary | ICD-10-CM

## 2020-03-28 LAB — POCT INR: INR: 2 (ref 2.0–3.0)

## 2020-03-28 NOTE — Progress Notes (Signed)
Medical screening examination/treatment/procedure(s) were performed by non-physician practitioner and as supervising physician I was immediately available for consultation/collaboration. I agree with above. Cherie Lasalle, MD   

## 2020-03-28 NOTE — Patient Instructions (Signed)
Pre visit review using our clinic review tool, if applicable. No additional management support is needed unless otherwise documented below in the visit note.  Change dosage and take 1 tablet daily.   Re-check in 2 weeks.  Dosing instructions given to Kathlee Nations, RN @ [facility] - [phone].  ** New # for pt is [phone]

## 2020-04-05 ENCOUNTER — Other Ambulatory Visit: Payer: Self-pay | Admitting: Family

## 2020-04-06 ENCOUNTER — Other Ambulatory Visit: Payer: Self-pay

## 2020-04-06 ENCOUNTER — Inpatient Hospital Stay (HOSPITAL_BASED_OUTPATIENT_CLINIC_OR_DEPARTMENT_OTHER): Payer: Medicare Other | Admitting: Hematology & Oncology

## 2020-04-06 ENCOUNTER — Inpatient Hospital Stay: Payer: Medicare Other

## 2020-04-06 ENCOUNTER — Inpatient Hospital Stay: Payer: Medicare Other | Attending: Hematology & Oncology

## 2020-04-06 VITALS — BP 112/67 | HR 81 | Temp 97.8°F | Resp 16

## 2020-04-06 DIAGNOSIS — N183 Chronic kidney disease, stage 3 unspecified: Secondary | ICD-10-CM | POA: Insufficient documentation

## 2020-04-06 DIAGNOSIS — I4891 Unspecified atrial fibrillation: Secondary | ICD-10-CM | POA: Insufficient documentation

## 2020-04-06 DIAGNOSIS — Z923 Personal history of irradiation: Secondary | ICD-10-CM | POA: Diagnosis not present

## 2020-04-06 DIAGNOSIS — C8589 Other specified types of non-Hodgkin lymphoma, extranodal and solid organ sites: Secondary | ICD-10-CM | POA: Insufficient documentation

## 2020-04-06 DIAGNOSIS — D631 Anemia in chronic kidney disease: Secondary | ICD-10-CM | POA: Diagnosis not present

## 2020-04-06 DIAGNOSIS — R600 Localized edema: Secondary | ICD-10-CM | POA: Diagnosis not present

## 2020-04-06 DIAGNOSIS — Z7901 Long term (current) use of anticoagulants: Secondary | ICD-10-CM | POA: Diagnosis not present

## 2020-04-06 DIAGNOSIS — N184 Chronic kidney disease, stage 4 (severe): Secondary | ICD-10-CM

## 2020-04-06 DIAGNOSIS — Z79899 Other long term (current) drug therapy: Secondary | ICD-10-CM | POA: Diagnosis not present

## 2020-04-06 DIAGNOSIS — N1831 Chronic kidney disease, stage 3a: Secondary | ICD-10-CM

## 2020-04-06 DIAGNOSIS — Z7984 Long term (current) use of oral hypoglycemic drugs: Secondary | ICD-10-CM | POA: Diagnosis not present

## 2020-04-06 DIAGNOSIS — R634 Abnormal weight loss: Secondary | ICD-10-CM | POA: Insufficient documentation

## 2020-04-06 DIAGNOSIS — Z791 Long term (current) use of non-steroidal anti-inflammatories (NSAID): Secondary | ICD-10-CM | POA: Diagnosis not present

## 2020-04-06 DIAGNOSIS — D500 Iron deficiency anemia secondary to blood loss (chronic): Secondary | ICD-10-CM | POA: Diagnosis not present

## 2020-04-06 DIAGNOSIS — H538 Other visual disturbances: Secondary | ICD-10-CM | POA: Insufficient documentation

## 2020-04-06 DIAGNOSIS — Z95828 Presence of other vascular implants and grafts: Secondary | ICD-10-CM

## 2020-04-06 DIAGNOSIS — Z9221 Personal history of antineoplastic chemotherapy: Secondary | ICD-10-CM | POA: Insufficient documentation

## 2020-04-06 LAB — CBC WITH DIFFERENTIAL (CANCER CENTER ONLY)
Abs Immature Granulocytes: 0.01 10*3/uL (ref 0.00–0.07)
Basophils Absolute: 0.1 10*3/uL (ref 0.0–0.1)
Basophils Relative: 1 %
Eosinophils Absolute: 0.2 10*3/uL (ref 0.0–0.5)
Eosinophils Relative: 3 %
HCT: 30.9 % — ABNORMAL LOW (ref 39.0–52.0)
Hemoglobin: 10.1 g/dL — ABNORMAL LOW (ref 13.0–17.0)
Immature Granulocytes: 0 %
Lymphocytes Relative: 29 %
Lymphs Abs: 1.5 10*3/uL (ref 0.7–4.0)
MCH: 31.2 pg (ref 26.0–34.0)
MCHC: 32.7 g/dL (ref 30.0–36.0)
MCV: 95.4 fL (ref 80.0–100.0)
Monocytes Absolute: 0.5 10*3/uL (ref 0.1–1.0)
Monocytes Relative: 9 %
Neutro Abs: 3.1 10*3/uL (ref 1.7–7.7)
Neutrophils Relative %: 58 %
Platelet Count: 135 10*3/uL — ABNORMAL LOW (ref 150–400)
RBC: 3.24 MIL/uL — ABNORMAL LOW (ref 4.22–5.81)
RDW: 14.4 % (ref 11.5–15.5)
WBC Count: 5.3 10*3/uL (ref 4.0–10.5)
nRBC: 0 % (ref 0.0–0.2)

## 2020-04-06 LAB — CMP (CANCER CENTER ONLY)
ALT: 5 U/L (ref 0–44)
AST: 14 U/L — ABNORMAL LOW (ref 15–41)
Albumin: 3.7 g/dL (ref 3.5–5.0)
Alkaline Phosphatase: 80 U/L (ref 38–126)
Anion gap: 8 (ref 5–15)
BUN: 36 mg/dL — ABNORMAL HIGH (ref 8–23)
CO2: 29 mmol/L (ref 22–32)
Calcium: 9.2 mg/dL (ref 8.9–10.3)
Chloride: 100 mmol/L (ref 98–111)
Creatinine: 1.9 mg/dL — ABNORMAL HIGH (ref 0.61–1.24)
GFR, Estimated: 34 mL/min — ABNORMAL LOW (ref >60)
Glucose, Bld: 79 mg/dL (ref 70–99)
Potassium: 4.1 mmol/L (ref 3.5–5.1)
Sodium: 137 mmol/L (ref 135–145)
Total Bilirubin: 0.3 mg/dL (ref 0.3–1.2)
Total Protein: 6.6 g/dL (ref 6.5–8.1)

## 2020-04-06 LAB — RETICULOCYTES
Immature Retic Fract: 13.6 % (ref 2.3–15.9)
RBC.: 3.28 MIL/uL — ABNORMAL LOW (ref 4.22–5.81)
Retic Count, Absolute: 26.2 10*3/uL (ref 19.0–186.0)
Retic Ct Pct: 0.8 % (ref 0.4–3.1)

## 2020-04-06 MED ORDER — EPOETIN ALFA-EPBX 40000 UNIT/ML IJ SOLN
INTRAMUSCULAR | Status: AC
  Filled 2020-04-06: qty 1

## 2020-04-06 MED ORDER — HEPARIN SOD (PORK) LOCK FLUSH 100 UNIT/ML IV SOLN
500.0000 [IU] | Freq: Once | INTRAVENOUS | Status: AC
  Administered 2020-04-06: 500 [IU] via INTRAVENOUS
  Filled 2020-04-06: qty 5

## 2020-04-06 MED ORDER — SODIUM CHLORIDE 0.9% FLUSH
10.0000 mL | INTRAVENOUS | Status: DC | PRN
  Administered 2020-04-06: 10 mL via INTRAVENOUS
  Filled 2020-04-06: qty 10

## 2020-04-06 MED ORDER — EPOETIN ALFA-EPBX 40000 UNIT/ML IJ SOLN
40000.0000 [IU] | Freq: Once | INTRAMUSCULAR | Status: DC
  Administered 2020-04-06: 40000 [IU] via SUBCUTANEOUS

## 2020-04-06 NOTE — Patient Instructions (Signed)

## 2020-04-06 NOTE — Progress Notes (Signed)
Hematology and Oncology Follow Up Visit  Gary Yates [id number] 10-07-1933 87 y.o. 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  His wife passed away a few weeks ago.  We were at her funeral.  Otherwise family has known Gary Yates family for probably 71 years.  His daughter and my wife are best friends.  He is managing pretty well.  He actually is doing nicely.  He does miss his poor wife.  He has lost weight.  A lot of this is fluid loss which is good for him.  He has had no problems with Coumadin.  He has atrial fibrillation.  He has had no obvious bleeding.  There is no change in bowel or bladder habits.  He does have the indwelling Foley catheter.  He has had no problems with fever.  He has had no rashes.  He has performed status probably ECOG 3.     Medications:  Current Outpatient Medications:  .  acetaminophen (TYLENOL) 500 MG tablet, Take 1,000 mg by mouth every 6 (six) hours as needed (for pain/fever/headaches.). , Disp: , Rfl:  .  atorvastatin (LIPITOR) 40 MG tablet, Take 1 tablet (40 mg total) by mouth daily., Disp: 90 tablet, Rfl: 0 .  chlorhexidine (PERIDEX) 0.12 % solution, Use as directed 15 mLs in the mouth or throat 2 (two) times daily. , Disp: , Rfl:  .  docusate sodium (COLACE) 50 MG capsule, Take 1 capsule (50 mg total) by mouth 2 (two) times daily as needed (for constipation)., Disp: 90 capsule, Rfl: 0 .  escitalopram (LEXAPRO) 10 MG tablet, Take one half  tablet once daily for one week and then increase to one daily., Disp: 30 tablet, Rfl: 2 .  furosemide (LASIX) 40 MG tablet, Take 60 mg by mouth daily., Disp: , Rfl:  .  gabapentin (NEURONTIN) 300 MG capsule, Take 600 mg by mouth 2 (two) times daily., Disp: , Rfl:  .  ibuprofen (ADVIL) 200 MG tablet, Take 600 mg by mouth every 3 (three) hours as needed for moderate pain., Disp: , Rfl:  .  metFORMIN (GLUCOPHAGE) 500 MG tablet, Take 1 tablet (500 mg total) by mouth 2 (two) times daily with a meal., Disp: 180 tablet, Rfl: 0 .  metoprolol succinate (TOPROL-XL) 25 MG 24 hr tablet, TAKE 1 TABLET BY MOUTH EVERY DAY, Disp: 30 tablet, Rfl: 9 .  nitroGLYCERIN (NITROSTAT) 0.4 MG SL tablet, Place 1 tablet (0.4 mg total) under the tongue every 5 (five) minutes as needed. Chest pain (Patient not taking: Reported on 03/06/2020), Disp: 25 tablet, Rfl: 6 .  pantoprazole (PROTONIX) 40 MG tablet, TAKE 1 TABLET BY MOUTH EVERY DAY, Disp: 30 tablet, Rfl: 6 .  polyethylene glycol (MIRALAX / GLYCOLAX) 17 g packet, Take 17 g by mouth daily as needed for mild constipation. , Disp: , Rfl:  .  potassium chloride SA (KLOR-CON M20)  20 MEQ tablet, Take 1 tablet (20 mEq total) by mouth daily., Disp: 90 tablet, Rfl: 1 .  pramipexole (MIRAPEX) 1.5 MG tablet, TAKE 2 TABLETS BY MOUTH EVERY EVENING (Patient taking differently: Take 3 mg by mouth at bedtime.), Disp: 180 tablet, Rfl: 1 .  warfarin (COUMADIN) 5 MG tablet, Take 1 tablet (5 mg total) by mouth daily. (Patient taking differently: Take 5 mg by mouth daily. 01/19/2020 Takes daily except one day he takes 1/2 tab.), Disp: 30 tablet, Rfl: 0  Allergies:  Allergies  Allergen Reactions  . Latex Other (See Comments)    Reddens the skin  . Ace Inhibitors Cough  . Codeine Nausea Only and Rash       . Penicillins Rash    Childhood allergy Has patient had a PCN reaction causing immediate rash, facial/tongue/throat swelling, SOB or lightheadedness with hypotension: Yes Has patient had  a PCN reaction causing severe rash involving mucus membranes or skin necrosis: Yes Has patient had a PCN reaction that required hospitalization No Has patient had a PCN reaction occurring within the last 10 years: No If all of the above answers are "NO", then may proceed with Cephalosporin use.     Past Medical History, Surgical history, Social history, and Family History were reviewed and updated.  Review of Systems: Review of Systems  HENT: Positive for sinus pain and sore throat.   Eyes: Positive for blurred vision.  Respiratory: Positive for wheezing.   Cardiovascular: Positive for leg swelling.  Gastrointestinal: Negative.   Genitourinary: Negative.   Musculoskeletal: Positive for back pain.  Skin: Negative.   Neurological: Positive for weakness.  Endo/Heme/Allergies: Negative.   Psychiatric/Behavioral: Negative.     Physical Exam:  vitals were not taken for this visit.   Wt Readings from Last 3 Encounters:  03/06/20 194 lb (88 kg)  02/28/20 207 lb (93.9 kg)  01/19/20 213 lb 1.9 oz (96.7 kg)      Physical Exam Vitals reviewed.  HENT:     Head: Normocephalic and atraumatic.  Eyes:     Pupils: Pupils are equal, round, and reactive to light.  Cardiovascular:     Rate and Rhythm: Normal rate and regular rhythm.     Heart sounds: Normal heart sounds.  Pulmonary:     Effort: Pulmonary effort is normal.     Breath sounds: Normal breath sounds.  Abdominal:     General: Bowel sounds are normal.     Palpations: Abdomen is soft.  Musculoskeletal:        General: No tenderness or deformity. Normal range of motion.     Cervical back: Normal range of motion.  Lymphadenopathy:     Cervical: No cervical adenopathy.  Skin:    General: Skin is warm and dry.     Findings: No erythema or rash.  Neurological:     Mental Status: He is alert and oriented to person, place, and time.  Psychiatric:        Behavior: Behavior normal.        Thought Content: Thought content  normal.        Judgment: Judgment normal.      Lab Results  Component Value Date   WBC 5.3 04/06/2020   HGB 10.1 (L) 04/06/2020   HCT 30.9 (L) 04/06/2020   MCV 95.4 04/06/2020   PLT 135 (L) 04/06/2020     Chemistry      Component Value Date/Time   NA 137 04/06/2020 1340   NA 137 08/18/2019 0000  NA 141 01/10/2017 1136   NA 137 12/14/2015 1100   K 4.1 04/06/2020 1340   K 4.4 01/10/2017 1136   K 3.7 12/14/2015 1100   CL 100 04/06/2020 1340   CL 104 01/10/2017 1136   CO2 29 04/06/2020 1340   CO2 27 01/10/2017 1136   CO2 24 12/14/2015 1100   BUN 36 (H) 04/06/2020 1340   BUN 29 (A) 08/18/2019 0000   BUN 19 01/10/2017 1136   BUN 18.4 12/14/2015 1100   CREATININE 1.90 (H) 04/06/2020 1340   CREATININE 1.76 (H) 10/08/2019 0100   CREATININE 0.9 12/14/2015 1100   GLU 122 08/18/2019 0000      Component Value Date/Time   CALCIUM 9.2 04/06/2020 1340   CALCIUM 9.4 01/10/2017 1136   CALCIUM 9.5 12/14/2015 1100   ALKPHOS 80 04/06/2020 1340   ALKPHOS 112 (H) 01/10/2017 1136   ALKPHOS 102 12/14/2015 1100   AST 14 (L) 04/06/2020 1340   AST 22 12/14/2015 1100   ALT 5 04/06/2020 1340   ALT 18 01/10/2017 1136   ALT 10 12/14/2015 1100   BILITOT 0.3 04/06/2020 1340   BILITOT 0.58 12/14/2015 1100      Impression and Plan: Mr. Cullens is an 87 year old white male. He has relapsed large cell non-Hodgkin lymphoma of the testicle. He received salvage chemotherapy with Rituxan/bendamustine/Velcade.  He did not really respond well to this.  We then treated him with dose reduced R-ICE.  He responded very well to this.  His last PET scan, which was done in November 2019, showed that he was in remission.   His problem now is that he has renal insufficiency and anemia secondary to a low erythropoietin level.  We will go ahead and give him some Aranesp.  We will have to see what his iron levels are.  This will be important.  Again, I just feel bad about his wife passing on.  I know that he  misses her.  We will still follow him up in 6 weeks.  He has a Port-A-Cath in.   Volanda Napoleon, MD 3/31/20222:42 PM

## 2020-04-06 NOTE — Addendum Note (Signed)
Addended by: Lucile Crater on: 04/06/2020 02:40 PM   Modules accepted: Orders

## 2020-04-07 LAB — IRON AND TIBC
Iron: 63 ug/dL (ref 42–163)
Saturation Ratios: 27 % (ref 20–55)
TIBC: 234 ug/dL (ref 202–409)
UIBC: 171 ug/dL (ref 117–376)

## 2020-04-07 LAB — FERRITIN: Ferritin: 636 ng/mL — ABNORMAL HIGH (ref 24–336)

## 2020-04-10 ENCOUNTER — Ambulatory Visit (INDEPENDENT_AMBULATORY_CARE_PROVIDER_SITE_OTHER): Payer: Medicare Other | Admitting: General Practice

## 2020-04-10 DIAGNOSIS — I480 Paroxysmal atrial fibrillation: Secondary | ICD-10-CM

## 2020-04-10 LAB — POCT INR: INR: 2 (ref 2.0–3.0)

## 2020-04-10 NOTE — Patient Instructions (Addendum)
Pre visit review using our clinic review tool, if applicable. No additional management support is needed unless otherwise documented below in the visit note.  Continue to take 1 tablet daily.   Re-check in 2 weeks.  Dosing instructions given to [place] @ [facility] - [phone]  ** New # for pt is [phone]

## 2020-04-26 ENCOUNTER — Ambulatory Visit (INDEPENDENT_AMBULATORY_CARE_PROVIDER_SITE_OTHER): Payer: Medicare Other | Admitting: General Practice

## 2020-04-26 DIAGNOSIS — I480 Paroxysmal atrial fibrillation: Secondary | ICD-10-CM

## 2020-04-26 LAB — POCT INR: INR: 2 (ref 2.0–3.0)

## 2020-04-26 NOTE — Patient Instructions (Signed)
Pre visit review using our clinic review tool, if applicable. No additional management support is needed unless otherwise documented below in the visit note.  Continue to take 1 tablet daily.   Re-check in 2 weeks.  Dosing instructions given to [place] @ [facility] - [phone]  ** New # for pt is [phone]

## 2020-04-29 ENCOUNTER — Other Ambulatory Visit: Payer: Self-pay | Admitting: Family Medicine

## 2020-04-29 DIAGNOSIS — I872 Venous insufficiency (chronic) (peripheral): Secondary | ICD-10-CM

## 2020-04-29 NOTE — Telephone Encounter (Signed)
Last filled by a historical provider. 

## 2020-05-05 ENCOUNTER — Other Ambulatory Visit: Payer: Self-pay | Admitting: Family Medicine

## 2020-05-08 ENCOUNTER — Telehealth: Payer: Self-pay | Admitting: Family Medicine

## 2020-05-08 NOTE — Telephone Encounter (Signed)
Gary Yates is calling and asking permission to continue home health physical. CB is [phone] ok to leave message.

## 2020-05-08 NOTE — Telephone Encounter (Signed)
Spoke with stacy, she is aware that the patient can continue.

## 2020-05-08 NOTE — Telephone Encounter (Signed)
OK to continue, as requested.

## 2020-05-09 ENCOUNTER — Telehealth: Payer: Self-pay | Admitting: General Practice

## 2020-05-09 MED ORDER — NITROGLYCERIN 0.4 MG SL SUBL: 0.4000 mg | SUBLINGUAL_TABLET | SUBLINGUAL | 1 refills | Status: AC | PRN

## 2020-05-09 NOTE — Telephone Encounter (Signed)
Gary Yates, [facility] RN called in about patients medications.  She has several questions. Her # is [phone].  Patient has gabapentin, 300 mg tablets.  He has been taking 2 capsules 3 times daily - Should it be 2 capsules 2 times daily?  Nitroglycerin tablets are expired.  She would like a new prescription sent to the pharmacy.  A level 3 interaction came up with the Lexapro and Metoprolol (Is this OK?)  She also had questions about a couple of medications that interact with warfarin.  I was able to handle those.  Thanks, [employer]

## 2020-05-09 NOTE — Telephone Encounter (Signed)
Opened in error

## 2020-05-09 NOTE — Telephone Encounter (Signed)
1.   I refilled the nitroglycerin  2.  he should be taking gabapentin 300 mg 2 tablets twice daily  3.  Escitalopram can increase levels of metoprolol but as long as he has stable pulse (no significant bradycardia) and he has been on this dose for some time would not make any adjustments at this time.  [name] MD [facility] at [facility]

## 2020-05-17 ENCOUNTER — Ambulatory Visit (INDEPENDENT_AMBULATORY_CARE_PROVIDER_SITE_OTHER): Payer: Medicare Other | Admitting: General Practice

## 2020-05-17 DIAGNOSIS — I480 Paroxysmal atrial fibrillation: Secondary | ICD-10-CM

## 2020-05-17 LAB — POCT INR: INR: 2.4 (ref 2.0–3.0)

## 2020-05-17 NOTE — Patient Instructions (Signed)
Pre visit review using our clinic review tool, if applicable. No additional management support is needed unless otherwise documented below in the visit note.  Continue to take 1 tablet daily.   Re-check in 2  to 3 weeks.  Dosing instructions given to Olivia Mackie, RN @ [facility] ** New # for pt is [phone]

## 2020-05-18 ENCOUNTER — Encounter: Payer: Self-pay | Admitting: Hematology & Oncology

## 2020-05-18 ENCOUNTER — Telehealth: Payer: Self-pay

## 2020-05-18 ENCOUNTER — Inpatient Hospital Stay: Payer: Medicare Other

## 2020-05-18 ENCOUNTER — Other Ambulatory Visit: Payer: Self-pay

## 2020-05-18 ENCOUNTER — Inpatient Hospital Stay: Payer: Medicare Other | Attending: Hematology & Oncology

## 2020-05-18 ENCOUNTER — Inpatient Hospital Stay (HOSPITAL_BASED_OUTPATIENT_CLINIC_OR_DEPARTMENT_OTHER): Payer: Medicare Other | Admitting: Hematology & Oncology

## 2020-05-18 VITALS — BP 114/46 | HR 77 | Temp 98.4°F | Resp 18 | Wt 212.0 lb

## 2020-05-18 DIAGNOSIS — H538 Other visual disturbances: Secondary | ICD-10-CM | POA: Diagnosis not present

## 2020-05-18 DIAGNOSIS — R748 Abnormal levels of other serum enzymes: Secondary | ICD-10-CM | POA: Diagnosis not present

## 2020-05-18 DIAGNOSIS — D631 Anemia in chronic kidney disease: Secondary | ICD-10-CM

## 2020-05-18 DIAGNOSIS — I4891 Unspecified atrial fibrillation: Secondary | ICD-10-CM | POA: Insufficient documentation

## 2020-05-18 DIAGNOSIS — Z7901 Long term (current) use of anticoagulants: Secondary | ICD-10-CM | POA: Diagnosis not present

## 2020-05-18 DIAGNOSIS — R600 Localized edema: Secondary | ICD-10-CM | POA: Insufficient documentation

## 2020-05-18 DIAGNOSIS — N289 Disorder of kidney and ureter, unspecified: Secondary | ICD-10-CM | POA: Insufficient documentation

## 2020-05-18 DIAGNOSIS — N183 Chronic kidney disease, stage 3 unspecified: Secondary | ICD-10-CM | POA: Diagnosis not present

## 2020-05-18 DIAGNOSIS — D508 Other iron deficiency anemias: Secondary | ICD-10-CM | POA: Insufficient documentation

## 2020-05-18 DIAGNOSIS — Z9221 Personal history of antineoplastic chemotherapy: Secondary | ICD-10-CM | POA: Insufficient documentation

## 2020-05-18 DIAGNOSIS — D500 Iron deficiency anemia secondary to blood loss (chronic): Secondary | ICD-10-CM

## 2020-05-18 DIAGNOSIS — Z9225 Personal history of immunosupression therapy: Secondary | ICD-10-CM | POA: Diagnosis not present

## 2020-05-18 DIAGNOSIS — Z79899 Other long term (current) drug therapy: Secondary | ICD-10-CM | POA: Diagnosis not present

## 2020-05-18 DIAGNOSIS — Z95828 Presence of other vascular implants and grafts: Secondary | ICD-10-CM

## 2020-05-18 DIAGNOSIS — C8589 Other specified types of non-Hodgkin lymphoma, extranodal and solid organ sites: Secondary | ICD-10-CM | POA: Diagnosis present

## 2020-05-18 DIAGNOSIS — Z791 Long term (current) use of non-steroidal anti-inflammatories (NSAID): Secondary | ICD-10-CM | POA: Diagnosis not present

## 2020-05-18 DIAGNOSIS — Z7984 Long term (current) use of oral hypoglycemic drugs: Secondary | ICD-10-CM | POA: Diagnosis not present

## 2020-05-18 DIAGNOSIS — Z923 Personal history of irradiation: Secondary | ICD-10-CM | POA: Insufficient documentation

## 2020-05-18 LAB — RETICULOCYTES
Immature Retic Fract: 16.1 % — ABNORMAL HIGH (ref 2.3–15.9)
RBC.: 3.11 MIL/uL — ABNORMAL LOW (ref 4.22–5.81)
Retic Count, Absolute: 26.1 10*3/uL (ref 19.0–186.0)
Retic Ct Pct: 0.8 % (ref 0.4–3.1)

## 2020-05-18 LAB — CBC WITH DIFFERENTIAL (CANCER CENTER ONLY)
Abs Immature Granulocytes: 0.01 10*3/uL (ref 0.00–0.07)
Basophils Absolute: 0.1 10*3/uL (ref 0.0–0.1)
Basophils Relative: 1 %
Eosinophils Absolute: 0.2 10*3/uL (ref 0.0–0.5)
Eosinophils Relative: 4 %
HCT: 30.2 % — ABNORMAL LOW (ref 39.0–52.0)
Hemoglobin: 9.9 g/dL — ABNORMAL LOW (ref 13.0–17.0)
Immature Granulocytes: 0 %
Lymphocytes Relative: 28 %
Lymphs Abs: 1.5 10*3/uL (ref 0.7–4.0)
MCH: 30.9 pg (ref 26.0–34.0)
MCHC: 32.8 g/dL (ref 30.0–36.0)
MCV: 94.4 fL (ref 80.0–100.0)
Monocytes Absolute: 0.5 10*3/uL (ref 0.1–1.0)
Monocytes Relative: 9 %
Neutro Abs: 3.1 10*3/uL (ref 1.7–7.7)
Neutrophils Relative %: 58 %
Platelet Count: 138 10*3/uL — ABNORMAL LOW (ref 150–400)
RBC: 3.2 MIL/uL — ABNORMAL LOW (ref 4.22–5.81)
RDW: 14.8 % (ref 11.5–15.5)
WBC Count: 5.3 10*3/uL (ref 4.0–10.5)
nRBC: 0 % (ref 0.0–0.2)

## 2020-05-18 LAB — CMP (CANCER CENTER ONLY)
ALT: 5 U/L (ref 0–44)
AST: 12 U/L — ABNORMAL LOW (ref 15–41)
Albumin: 3.7 g/dL (ref 3.5–5.0)
Alkaline Phosphatase: 82 U/L (ref 38–126)
Anion gap: 8 (ref 5–15)
BUN: 36 mg/dL — ABNORMAL HIGH (ref 8–23)
CO2: 27 mmol/L (ref 22–32)
Calcium: 9.1 mg/dL (ref 8.9–10.3)
Chloride: 99 mmol/L (ref 98–111)
Creatinine: 2.09 mg/dL — ABNORMAL HIGH (ref 0.61–1.24)
GFR, Estimated: 30 mL/min — ABNORMAL LOW (ref >60)
Glucose, Bld: 116 mg/dL — ABNORMAL HIGH (ref 70–99)
Potassium: 4.3 mmol/L (ref 3.5–5.1)
Sodium: 134 mmol/L — ABNORMAL LOW (ref 135–145)
Total Bilirubin: 0.3 mg/dL (ref 0.3–1.2)
Total Protein: 6.5 g/dL (ref 6.5–8.1)

## 2020-05-18 MED ORDER — HEPARIN SOD (PORK) LOCK FLUSH 100 UNIT/ML IV SOLN
500.0000 [IU] | Freq: Once | INTRAVENOUS | Status: AC
  Administered 2020-05-18: 500 [IU] via INTRAVENOUS
  Filled 2020-05-18: qty 5

## 2020-05-18 MED ORDER — SODIUM CHLORIDE 0.9% FLUSH
10.0000 mL | Freq: Once | INTRAVENOUS | Status: AC
  Administered 2020-05-18: 10 mL via INTRAVENOUS
  Filled 2020-05-18: qty 10

## 2020-05-18 MED ORDER — EPOETIN ALFA-EPBX 40000 UNIT/ML IJ SOLN
INTRAMUSCULAR | Status: AC
  Filled 2020-05-18: qty 1

## 2020-05-18 MED ORDER — EPOETIN ALFA-EPBX 40000 UNIT/ML IJ SOLN
40000.0000 [IU] | Freq: Once | INTRAMUSCULAR | Status: AC
  Administered 2020-05-18: 40000 [IU] via SUBCUTANEOUS

## 2020-05-18 NOTE — Patient Instructions (Signed)

## 2020-05-18 NOTE — Telephone Encounter (Signed)
appts made per 05/18/20, will see sarah as dr e has no avail spots, a calendar has been printed for the pt  Gary Yates

## 2020-05-18 NOTE — Progress Notes (Signed)
Hematology and Oncology Follow Up Visit  Gary Yates [id number] 1933/02/21 87 y.o. 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  He seems to be doing a little bit better.  I know his wife passed on back in 04/12/22.  He has been a little more active.  His family is done a good job with him.  He has had no problems with his heart.  He is on Coumadin.  Has atrial fibrillation.  He does have the indwelling Foley catheter with a leg bag to help collect his urine.  He has gained little bit of weight.  His son, comes with him, says he is eating a little bit better.  He has had no issues with fever.  There is no cough.  His last iron studies back in 2022-04-12 showed a ferritin of 636 with an iron saturation of 27%.  He has had no headache.  I am just glad that his quality of life seems to be improving a little bit.  Overall, his performance status is ECOG 2.     Medications:  Current Outpatient Medications:  .  acetaminophen (TYLENOL) 500 MG tablet, Take 1,000 mg by mouth every 6 (six) hours as needed (for pain/fever/headaches.). , Disp: , Rfl:  .  atorvastatin (LIPITOR) 40 MG tablet, Take 1 tablet (40 mg total) by mouth daily., Disp: 90 tablet, Rfl: 0 .  chlorhexidine (PERIDEX) 0.12 % solution, Use as directed 15 mLs in the mouth or throat 2 (two) times daily. , Disp: , Rfl:  .  docusate sodium (COLACE) 50 MG capsule, Take 1 capsule (50 mg total) by mouth 2 (two) times daily as needed  (for constipation)., Disp: 90 capsule, Rfl: 0 .  escitalopram (LEXAPRO) 10 MG tablet, Take one half tablet once daily for one week and then increase to one daily., Disp: 30 tablet, Rfl: 2 .  furosemide (LASIX) 40 MG tablet, TAKE 1 TABLET BY MOUTH EVERY DAY, Disp: 90 tablet, Rfl: 1 .  gabapentin (NEURONTIN) 300 MG capsule, Take 600 mg by mouth 2 (two) times daily., Disp: , Rfl:  .  ibuprofen (ADVIL) 200 MG tablet, Take 600 mg by mouth every 3 (three) hours as needed for moderate pain., Disp: , Rfl:  .  metFORMIN (GLUCOPHAGE) 500 MG tablet, Take 1 tablet (500 mg total) by mouth 2 (two) times daily with a meal., Disp: 180 tablet, Rfl: 0 .  metoprolol succinate (TOPROL-XL) 25 MG 24 hr tablet, TAKE 1 TABLET BY MOUTH EVERY DAY, Disp: 30 tablet, Rfl: 9 .  nitroGLYCERIN (NITROSTAT) 0.4 MG SL tablet, Place 1 tablet (0.4 mg total) under the tongue every 5 (five) minutes as needed. Chest pain, Disp: 25 tablet, Rfl: 1 .  pantoprazole (PROTONIX) 40 MG tablet, TAKE 1 TABLET BY MOUTH EVERY DAY, Disp: 30 tablet, Rfl: 6 .  polyethylene glycol (MIRALAX / GLYCOLAX) 17 g packet, Take 17  g by mouth daily as needed for mild constipation. , Disp: , Rfl:  .  potassium chloride SA (KLOR-CON M20) 20 MEQ tablet, Take 1 tablet (20 mEq total) by mouth daily., Disp: 90 tablet, Rfl: 1 .  pramipexole (MIRAPEX) 1.5 MG tablet, TAKE 2 TABLETS BY MOUTH EVERY EVENING, Disp: 180 tablet, Rfl: 1 .  warfarin (COUMADIN) 5 MG tablet, Take 1 tablet (5 mg total) by mouth daily. (Patient taking differently: Take 5 mg by mouth daily. 01/19/2020 Takes daily except one day he takes 1/2 tab.), Disp: 30 tablet, Rfl: 0 No current facility-administered medications for this visit.  Facility-Administered Medications Ordered in Other Visits:  .  epoetin alfa-epbx (RETACRIT) injection 40,000 Units, 40,000 Units, Subcutaneous, Once, Shimshon Narula, Rudell Cobb, MD  Allergies:  Allergies  Allergen Reactions  . Latex Other (See Comments)    Reddens the skin  . Ace  Inhibitors Cough  . Codeine Nausea Only and Rash       . Penicillins Rash    Childhood allergy Has patient had a PCN reaction causing immediate rash, facial/tongue/throat swelling, SOB or lightheadedness with hypotension: Yes Has patient had a PCN reaction causing severe rash involving mucus membranes or skin necrosis: Yes Has patient had a PCN reaction that required hospitalization No Has patient had a PCN reaction occurring within the last 10 years: No If all of the above answers are "NO", then may proceed with Cephalosporin use.     Past Medical History, Surgical history, Social history, and Family History were reviewed and updated.  Review of Systems: Review of Systems  HENT: Positive for sinus pain and sore throat.   Eyes: Positive for blurred vision.  Respiratory: Positive for wheezing.   Cardiovascular: Positive for leg swelling.  Gastrointestinal: Negative.   Genitourinary: Negative.   Musculoskeletal: Positive for back pain.  Skin: Negative.   Neurological: Positive for weakness.  Endo/Heme/Allergies: Negative.   Psychiatric/Behavioral: Negative.     Physical Exam:  weight is 212 lb (96.2 kg). His oral temperature is 98.4 F (36.9 C). His blood pressure is 114/46 (abnormal) and his pulse is 77. His respiration is 18 and oxygen saturation is 100%.   Wt Readings from Last 3 Encounters:  05/18/20 212 lb (96.2 kg)  03/06/20 194 lb (88 kg)  02/28/20 207 lb (93.9 kg)      Physical Exam Vitals reviewed.  HENT:     Head: Normocephalic and atraumatic.  Eyes:     Pupils: Pupils are equal, round, and reactive to light.  Cardiovascular:     Rate and Rhythm: Normal rate and regular rhythm.     Heart sounds: Normal heart sounds.  Pulmonary:     Effort: Pulmonary effort is normal.     Breath sounds: Normal breath sounds.  Abdominal:     General: Bowel sounds are normal.     Palpations: Abdomen is soft.  Musculoskeletal:        General: No tenderness or deformity.  Normal range of motion.     Cervical back: Normal range of motion.  Lymphadenopathy:     Cervical: No cervical adenopathy.  Skin:    General: Skin is warm and dry.     Findings: No erythema or rash.  Neurological:     Mental Status: He is alert and oriented to person, place, and time.  Psychiatric:        Behavior: Behavior normal.        Thought Content: Thought content normal.        Judgment: Judgment  normal.      Lab Results  Component Value Date   WBC 5.3 05/18/2020   HGB 9.9 (L) 05/18/2020   HCT 30.2 (L) 05/18/2020   MCV 94.4 05/18/2020   PLT 138 (L) 05/18/2020     Chemistry      Component Value Date/Time   NA 134 (L) 05/18/2020 1440   NA 137 08/18/2019 0000   NA 141 01/10/2017 1136   NA 137 12/14/2015 1100   K 4.3 05/18/2020 1440   K 4.4 01/10/2017 1136   K 3.7 12/14/2015 1100   CL 99 05/18/2020 1440   CL 104 01/10/2017 1136   CO2 27 05/18/2020 1440   CO2 27 01/10/2017 1136   CO2 24 12/14/2015 1100   BUN 36 (H) 05/18/2020 1440   BUN 29 (A) 08/18/2019 0000   BUN 19 01/10/2017 1136   BUN 18.4 12/14/2015 1100   CREATININE 2.09 (H) 05/18/2020 1440   CREATININE 1.76 (H) 10/08/2019 0100   CREATININE 0.9 12/14/2015 1100   GLU 122 08/18/2019 0000      Component Value Date/Time   CALCIUM 9.1 05/18/2020 1440   CALCIUM 9.4 01/10/2017 1136   CALCIUM 9.5 12/14/2015 1100   ALKPHOS 82 05/18/2020 1440   ALKPHOS 112 (H) 01/10/2017 1136   ALKPHOS 102 12/14/2015 1100   AST 12 (L) 05/18/2020 1440   AST 22 12/14/2015 1100   ALT 5 05/18/2020 1440   ALT 18 01/10/2017 1136   ALT 10 12/14/2015 1100   BILITOT 0.3 05/18/2020 1440   BILITOT 0.58 12/14/2015 1100      Impression and Plan: Mr. Liszewski is an 87 year old white male. He has relapsed large cell non-Hodgkin lymphoma of the testicle. He received salvage chemotherapy with Rituxan/bendamustine/Velcade.  He did not really respond well to this.  We then treated him with dose reduced R-ICE.  He responded very well to  this.  His last PET scan, which was done in November 2019, showed that he was in remission.   His problem now is that he has renal insufficiency and anemia secondary to a low erythropoietin level.  We will go ahead and give him some Aranesp.  We will have to see what his iron levels are.  This will be important.  We will still follow him up in 6 weeks.  He has a Port-A-Cath in.   Volanda Napoleon, MD 5/12/20223:30 PM

## 2020-05-19 LAB — FERRITIN: Ferritin: 570 ng/mL — ABNORMAL HIGH (ref 24–336)

## 2020-05-19 LAB — IRON AND TIBC
Iron: 65 ug/dL (ref 42–163)
Saturation Ratios: 28 % (ref 20–55)
TIBC: 228 ug/dL (ref 202–409)
UIBC: 163 ug/dL (ref 117–376)

## 2020-05-25 ENCOUNTER — Telehealth: Payer: Self-pay | Admitting: Family Medicine

## 2020-05-25 NOTE — Telephone Encounter (Signed)
We have record of him receiving initial 2 vaccines and one booster.  Since we are not giving the vaccine, it is possible we do not have complete records.   If he has not received second booster he should consider doing so- as long as 6 months from first.

## 2020-05-25 NOTE — Telephone Encounter (Signed)
Gary Yates the patients daughter called to see if the patient has received his 2 COVID vaccines and if he has received his booster shot and how my boosters has he received and if he needs the boosters and how many and how apart does she need to schedule them for the patient.\  Please advise

## 2020-05-25 NOTE — Telephone Encounter (Signed)
Patient received Pfizer vaccine on 01/26/19, 02/15/19, 09/20/19.

## 2020-05-26 NOTE — Telephone Encounter (Signed)
Spoke with daughter [name], discussed message.  Voiced understanding

## 2020-05-30 ENCOUNTER — Other Ambulatory Visit: Payer: Self-pay | Admitting: Family Medicine

## 2020-06-03 ENCOUNTER — Other Ambulatory Visit: Payer: Self-pay | Admitting: Physician Assistant

## 2020-06-03 DIAGNOSIS — I872 Venous insufficiency (chronic) (peripheral): Secondary | ICD-10-CM

## 2020-06-07 ENCOUNTER — Ambulatory Visit (INDEPENDENT_AMBULATORY_CARE_PROVIDER_SITE_OTHER): Payer: Medicare Other | Admitting: General Practice

## 2020-06-07 ENCOUNTER — Other Ambulatory Visit: Payer: Self-pay | Admitting: Family Medicine

## 2020-06-07 DIAGNOSIS — I480 Paroxysmal atrial fibrillation: Secondary | ICD-10-CM

## 2020-06-07 LAB — POCT INR: INR: 1.5 — AB (ref 2.0–3.0)

## 2020-06-07 NOTE — Patient Instructions (Signed)
Pre visit review using our clinic review tool, if applicable. No additional management support is needed unless otherwise documented below in the visit note.  Please take 1 1/2 tablets today and tomorrow and then continue to take 1 tablet daily.   Re-check in 1 week.   Dosing instructions given to Maudie Mercury, RN @ [facility] [phone] ** New # for pt is [phone],  Villa Herb, RN also left a VM for patient.

## 2020-06-07 NOTE — Telephone Encounter (Signed)
Protonix is not due for refills, gabapentin last filled by a historical provider, coumadin will be sent to [facility].   Please advise on gabapentin. [place] for refills?

## 2020-06-15 LAB — POCT INR: INR: 2.2 (ref 2.0–3.0)

## 2020-06-20 ENCOUNTER — Ambulatory Visit (INDEPENDENT_AMBULATORY_CARE_PROVIDER_SITE_OTHER): Payer: Medicare Other | Admitting: General Practice

## 2020-06-20 DIAGNOSIS — I480 Paroxysmal atrial fibrillation: Secondary | ICD-10-CM | POA: Diagnosis not present

## 2020-06-20 NOTE — Patient Instructions (Signed)
Pre visit review using our clinic review tool, if applicable. No additional management support is needed unless otherwise documented below in the visit note.  *INR from 6/9 Please continue to take 1 tablet daily.   Re-check in 2 weeks.   Dosing instructions given to Maudie Mercury, RN @ [facility] [phone] ** New # for pt is [phone],  Villa Herb, RN also left a VM for patient.

## 2020-06-20 NOTE — Progress Notes (Signed)
Medical screening examination/treatment/procedure(s) were performed by non-physician practitioner and as supervising physician I was immediately available for consultation/collaboration. I agree with above. Kooper Chriswell, MD   

## 2020-06-26 ENCOUNTER — Telehealth: Payer: Self-pay

## 2020-06-26 ENCOUNTER — Inpatient Hospital Stay: Payer: Medicare Other

## 2020-06-26 ENCOUNTER — Other Ambulatory Visit: Payer: Self-pay

## 2020-06-26 ENCOUNTER — Encounter: Payer: Self-pay | Admitting: Family

## 2020-06-26 ENCOUNTER — Inpatient Hospital Stay (HOSPITAL_BASED_OUTPATIENT_CLINIC_OR_DEPARTMENT_OTHER): Payer: Medicare Other | Admitting: Family

## 2020-06-26 ENCOUNTER — Inpatient Hospital Stay: Payer: Medicare Other | Attending: Hematology & Oncology

## 2020-06-26 VITALS — BP 101/52 | HR 64 | Temp 97.6°F | Resp 19

## 2020-06-26 VITALS — BP 101/52 | HR 64 | Temp 97.6°F | Resp 19 | Wt 202.0 lb

## 2020-06-26 DIAGNOSIS — Z9221 Personal history of antineoplastic chemotherapy: Secondary | ICD-10-CM | POA: Insufficient documentation

## 2020-06-26 DIAGNOSIS — N289 Disorder of kidney and ureter, unspecified: Secondary | ICD-10-CM | POA: Diagnosis present

## 2020-06-26 DIAGNOSIS — Z923 Personal history of irradiation: Secondary | ICD-10-CM | POA: Diagnosis not present

## 2020-06-26 DIAGNOSIS — D631 Anemia in chronic kidney disease: Secondary | ICD-10-CM | POA: Diagnosis present

## 2020-06-26 DIAGNOSIS — D500 Iron deficiency anemia secondary to blood loss (chronic): Secondary | ICD-10-CM

## 2020-06-26 DIAGNOSIS — N184 Chronic kidney disease, stage 4 (severe): Secondary | ICD-10-CM | POA: Diagnosis not present

## 2020-06-26 DIAGNOSIS — N183 Chronic kidney disease, stage 3 unspecified: Secondary | ICD-10-CM

## 2020-06-26 DIAGNOSIS — C8589 Other specified types of non-Hodgkin lymphoma, extranodal and solid organ sites: Secondary | ICD-10-CM | POA: Insufficient documentation

## 2020-06-26 DIAGNOSIS — Z95828 Presence of other vascular implants and grafts: Secondary | ICD-10-CM

## 2020-06-26 LAB — CBC WITH DIFFERENTIAL (CANCER CENTER ONLY)
Abs Immature Granulocytes: 0.03 10*3/uL (ref 0.00–0.07)
Basophils Absolute: 0 10*3/uL (ref 0.0–0.1)
Basophils Relative: 1 %
Eosinophils Absolute: 0.2 10*3/uL (ref 0.0–0.5)
Eosinophils Relative: 4 %
HCT: 33.5 % — ABNORMAL LOW (ref 39.0–52.0)
Hemoglobin: 10.9 g/dL — ABNORMAL LOW (ref 13.0–17.0)
Immature Granulocytes: 1 %
Lymphocytes Relative: 25 %
Lymphs Abs: 1.5 10*3/uL (ref 0.7–4.0)
MCH: 30.7 pg (ref 26.0–34.0)
MCHC: 32.5 g/dL (ref 30.0–36.0)
MCV: 94.4 fL (ref 80.0–100.0)
Monocytes Absolute: 0.5 10*3/uL (ref 0.1–1.0)
Monocytes Relative: 8 %
Neutro Abs: 3.6 10*3/uL (ref 1.7–7.7)
Neutrophils Relative %: 61 %
Platelet Count: 140 10*3/uL — ABNORMAL LOW (ref 150–400)
RBC: 3.55 MIL/uL — ABNORMAL LOW (ref 4.22–5.81)
RDW: 14.9 % (ref 11.5–15.5)
WBC Count: 5.8 10*3/uL (ref 4.0–10.5)
nRBC: 0 % (ref 0.0–0.2)

## 2020-06-26 LAB — CMP (CANCER CENTER ONLY)
ALT: 5 U/L (ref 0–44)
AST: 12 U/L — ABNORMAL LOW (ref 15–41)
Albumin: 4.1 g/dL (ref 3.5–5.0)
Alkaline Phosphatase: 74 U/L (ref 38–126)
Anion gap: 9 (ref 5–15)
BUN: 38 mg/dL — ABNORMAL HIGH (ref 8–23)
CO2: 29 mmol/L (ref 22–32)
Calcium: 9.7 mg/dL (ref 8.9–10.3)
Chloride: 100 mmol/L (ref 98–111)
Creatinine: 2.26 mg/dL — ABNORMAL HIGH (ref 0.61–1.24)
GFR, Estimated: 28 mL/min — ABNORMAL LOW (ref >60)
Glucose, Bld: 102 mg/dL — ABNORMAL HIGH (ref 70–99)
Potassium: 4.5 mmol/L (ref 3.5–5.1)
Sodium: 138 mmol/L (ref 135–145)
Total Bilirubin: 0.5 mg/dL (ref 0.3–1.2)
Total Protein: 7.2 g/dL (ref 6.5–8.1)

## 2020-06-26 LAB — IRON AND TIBC
Iron: 70 ug/dL (ref 45–182)
Saturation Ratios: 26 % (ref 17.9–39.5)
TIBC: 270 ug/dL (ref 250–450)
UIBC: 200 ug/dL

## 2020-06-26 LAB — FERRITIN: Ferritin: 395 ng/mL — ABNORMAL HIGH (ref 24–336)

## 2020-06-26 LAB — PROTIME-INR
INR: 1.7 — ABNORMAL HIGH (ref 0.8–1.2)
Prothrombin Time: 19.9 seconds — ABNORMAL HIGH (ref 11.4–15.2)

## 2020-06-26 LAB — LACTATE DEHYDROGENASE: LDH: 161 U/L (ref 98–192)

## 2020-06-26 MED ORDER — EPOETIN ALFA-EPBX 40000 UNIT/ML IJ SOLN
INTRAMUSCULAR | Status: AC
  Filled 2020-06-26: qty 1

## 2020-06-26 MED ORDER — HEPARIN SOD (PORK) LOCK FLUSH 100 UNIT/ML IV SOLN
500.0000 [IU] | Freq: Once | INTRAVENOUS | Status: AC
  Administered 2020-06-26: 500 [IU] via INTRAVENOUS
  Filled 2020-06-26: qty 5

## 2020-06-26 MED ORDER — SODIUM CHLORIDE 0.9% FLUSH
10.0000 mL | Freq: Once | INTRAVENOUS | Status: AC
  Administered 2020-06-26: 10 mL via INTRAVENOUS
  Filled 2020-06-26: qty 10

## 2020-06-26 MED ORDER — EPOETIN ALFA-EPBX 40000 UNIT/ML IJ SOLN
40000.0000 [IU] | Freq: Once | INTRAMUSCULAR | Status: AC
  Administered 2020-06-26: 40000 [IU] via SUBCUTANEOUS

## 2020-06-26 NOTE — Telephone Encounter (Signed)
Appts made and printed for pt per 06/26/20 los

## 2020-06-26 NOTE — Progress Notes (Signed)
Hematology and Oncology Follow Up Visit  Gary Yates [id number] 1933-07-18 87 y.o. 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 He is enjoying the warm weather and getting out in his yard.  He has some fatigue at times.  No fever, chills, n/v, cough, rash, dizziness, SOB, chest pain, palpitations, abdominal pain or changes in bowel or bladder habits.  No blood loss noted. No bruising or petechiae.  The swelling in his legs seems to have improved. Pedal pulses are 2+.  He has tenderness in his hands which he feels is due to arthritis.  No numbness or tingling in his extremities at this time.  He states that he doesn't have much of an appetite. He has had a hard time cooking for one since his wife passed away and by the time he finishes he doesn't feel like eating. He will try adding a Boost to his daily regimen. He is trying his best to stay well hydrated. His weight is down 10 lbs to 202 lbs. We will continue to follow-up.   ECOG Performance Status: 1 - Symptomatic but completely ambulatory  Medications:  Allergies as of 06/26/2020       Reactions   Latex Other (See Comments)   Reddens the skin   Ace Inhibitors Cough   Codeine Nausea Only, Rash      Penicillins Rash   Childhood allergy Has patient had a PCN reaction causing immediate rash, facial/tongue/throat swelling, SOB or lightheadedness with hypotension: Yes Has patient had a PCN reaction causing  severe rash involving mucus membranes or skin necrosis: Yes Has patient had a PCN reaction that required hospitalization No Has patient had a PCN reaction occurring within the last 10 years: No If all of the above answers are "NO", then may proceed with Cephalosporin use.        Medication List        Accurate as of June 26, 2020 11:39 AM. If you have any questions, ask your nurse or doctor.          acetaminophen 500 MG tablet Commonly known as: TYLENOL Take 1,000 mg by mouth every 6 (six) hours as needed (for pain/fever/headaches.).   atorvastatin 40 MG tablet Commonly known as: LIPITOR Take 1 tablet (40 mg total) by mouth daily.   chlorhexidine 0.12 % solution Commonly known as: PERIDEX Use as directed 15 mLs in the mouth or throat 2 (two) times daily.   docusate sodium 50 MG capsule Commonly known as: COLACE Take 1 capsule (50 mg total) by mouth 2 (two) times daily as needed (for constipation).   escitalopram 10 MG tablet Commonly known as: LEXAPRO TAKE ONE HALF TABLET ONCE DAILY FOR ONE WEEK AND THEN INCREASE TO ONE DAILY.   furosemide 40 MG tablet Commonly known as: LASIX TAKE 1 TABLET BY MOUTH EVERY DAY   gabapentin 300 MG capsule Commonly known as: NEURONTIN TAKE 2 CAPSULES BY MOUTH 3 TIMES A  DAY   ibuprofen 200 MG tablet Commonly known as: ADVIL Take 600 mg by mouth every 3 (three) hours as needed for moderate pain.   metFORMIN 500 MG tablet Commonly known as: GLUCOPHAGE Take 1 tablet (500 mg total) by mouth 2 (two) times daily with a meal.   metoprolol succinate 25 MG 24 hr tablet Commonly known as: TOPROL-XL TAKE 1 TABLET BY MOUTH EVERY DAY   nitroGLYCERIN 0.4 MG SL tablet Commonly known as: NITROSTAT Place 1 tablet (0.4 mg total) under the tongue every 5 (five) minutes as needed. Chest pain   pantoprazole 40 MG tablet Commonly known as: PROTONIX TAKE 1 TABLET BY MOUTH EVERY DAY   polyethylene glycol 17 g packet Commonly known as: MIRALAX  / GLYCOLAX Take 17 g by mouth daily as needed for mild constipation.   potassium chloride SA 20 MEQ tablet Commonly known as: Klor-Con M20 Take 1 tablet (20 mEq total) by mouth daily.   pramipexole 1.5 MG tablet Commonly known as: MIRAPEX TAKE 2 TABLETS BY MOUTH EVERY EVENING   warfarin 5 MG tablet Commonly known as: COUMADIN Take as directed by the anticoagulation clinic. If you are unsure how to take this medication, talk to your nurse or doctor. Original instructions: Take 1 tablet daily or Take as directed by anticoagulation clinic        Allergies:  Allergies  Allergen Reactions   Latex Other (See Comments)    Reddens the skin   Ace Inhibitors Cough   Codeine Nausea Only and Rash        Penicillins Rash    Childhood allergy Has patient had a PCN reaction causing immediate rash, facial/tongue/throat swelling, SOB or lightheadedness with hypotension: Yes Has patient had a PCN reaction causing severe rash involving mucus membranes or skin necrosis: Yes Has patient had a PCN reaction that required hospitalization No Has patient had a PCN reaction occurring within the last 10 years: No If all of the above answers are "NO", then may proceed with Cephalosporin use.     Past Medical History, Surgical history, Social history, and Family History were reviewed and updated.  Review of Systems: All other 10 point review of systems is negative.   Physical Exam:  weight is 202 lb (91.6 kg). His oral temperature is 97.6 F (36.4 C). His blood pressure is 101/52 (abnormal) and his pulse is 64. His respiration is 19 and oxygen saturation is 98%.   Wt Readings from Last 3 Encounters:  06/26/20 202 lb (91.6 kg)  05/18/20 212 lb (96.2 kg)  03/06/20 194 lb (88 kg)    Ocular: Sclerae unicteric, pupils equal, round and reactive to light Ear-nose-throat: Oropharynx clear, dentition fair Lymphatic: No cervical or supraclavicular adenopathy Lungs no rales or rhonchi, good excursion  bilaterally Heart regular rate and rhythm, no murmur appreciated Abd soft, nontender, positive bowel sounds MSK no focal spinal tenderness, no joint edema Neuro: non-focal, well-oriented, appropriate affect Breasts: Deferred   Lab Results  Component Value Date   WBC 5.8 06/26/2020   HGB 10.9 (L) 06/26/2020   HCT 33.5 (L) 06/26/2020   MCV 94.4 06/26/2020   PLT 140 (L) 06/26/2020   Lab Results  Component Value Date   FERRITIN 570 (H) 05/18/2020   IRON 65 05/18/2020   TIBC 228 05/18/2020   UIBC 163 05/18/2020   IRONPCTSAT 28 05/18/2020   Lab Results  Component Value Date   RETICCTPCT 0.8 05/18/2020   RBC 3.55 (L) 06/26/2020   No results found for: KPAFRELGTCHN, LAMBDASER,  KAPLAMBRATIO No results found for: IGGSERUM, IGA, IGMSERUM No results found for: Kathrynn Ducking, MSPIKE, SPEI   Chemistry      Component Value Date/Time   NA 138 06/26/2020 1001   NA 137 08/18/2019 0000   NA 141 01/10/2017 1136   NA 137 12/14/2015 1100   K 4.5 06/26/2020 1001   K 4.4 01/10/2017 1136   K 3.7 12/14/2015 1100   CL 100 06/26/2020 1001   CL 104 01/10/2017 1136   CO2 29 06/26/2020 1001   CO2 27 01/10/2017 1136   CO2 24 12/14/2015 1100   BUN 38 (H) 06/26/2020 1001   BUN 29 (A) 08/18/2019 0000   BUN 19 01/10/2017 1136   BUN 18.4 12/14/2015 1100   CREATININE 2.26 (H) 06/26/2020 1001   CREATININE 1.76 (H) 10/08/2019 0100   CREATININE 0.9 12/14/2015 1100   GLU 122 08/18/2019 0000      Component Value Date/Time   CALCIUM 9.7 06/26/2020 1001   CALCIUM 9.4 01/10/2017 1136   CALCIUM 9.5 12/14/2015 1100   ALKPHOS 74 06/26/2020 1001   ALKPHOS 112 (H) 01/10/2017 1136   ALKPHOS 102 12/14/2015 1100   AST 12 (L) 06/26/2020 1001   AST 22 12/14/2015 1100   ALT 5 06/26/2020 1001   ALT 18 01/10/2017 1136   ALT 10 12/14/2015 1100   BILITOT 0.5 06/26/2020 1001   BILITOT 0.58 12/14/2015 1100       Impression and Plan: Mr. Cobbins is an 87 yo caucasian  male with history of relapsed large cell non-Hodgkin lymphoma of the testicle. He received salvage chemotherapy with Rituxan/bendamustine/Velcade.  He did not really respond well to this.  We then treated him with dose reduced R-ICE.  He responded very well to this.  His last PET scan in November 2019 showed that he was in remission. So far, there has been no evidence of recurrence.  Iron studies are pending. We can replace if needed.  ESA given for Hgb 10.8.  Follow-up with lab and port flush in 6 weeks.  They can contact our office with any questions or concerns.   Laverna Peace, NP 6/20/202211:39 AM

## 2020-06-26 NOTE — Patient Instructions (Signed)
Epoetin Alfa injection What is this medication? EPOETIN ALFA (e POE e tin AL fa) helps your body make more red blood cells. This medicine is used to treat anemia caused by chronic kidney disease, cancer chemotherapy, or HIV-therapy. It may also be used before surgery if you have anemia. This medicine may be used for other purposes; ask your health care provider or pharmacist if you have questions. COMMON BRAND NAME(S): Epogen, Procrit, Retacrit What should I tell my care team before I take this medication? They need to know if you have any of these conditions: cancer heart disease high blood pressure history of blood clots history of stroke low levels of folate, iron, or vitamin B12 in the blood seizures an unusual or allergic reaction to erythropoietin, albumin, benzyl alcohol, hamster proteins, other medicines, foods, dyes, or preservatives pregnant or trying to get pregnant breast-feeding How should I use this medication? This medicine is for injection into a vein or under the skin. It is usually given by a health care professional in a hospital or clinic setting. If you get this medicine at home, you will be taught how to prepare and give this medicine. Use exactly as directed. Take your medicine at regular intervals. Do not take your medicine more often than directed. It is important that you put your used needles and syringes in a special sharps container. Do not put them in a trash can. If you do not have a sharps container, call your pharmacist or healthcare provider to get one. A special MedGuide will be given to you by the pharmacist with each prescription and refill. Be sure to read this information carefully each time. Talk to your pediatrician regarding the use of this medicine in children. While this drug may be prescribed for selected conditions, precautions do apply. Overdosage: If you think you have taken too much of this medicine contact a poison control center or emergency  room at once. NOTE: This medicine is only for you. Do not share this medicine with others. What if I miss a dose? If you miss a dose, take it as soon as you can. If it is almost time for your next dose, take only that dose. Do not take double or extra doses. What may interact with this medication? Interactions have not been studied. This list may not describe all possible interactions. Give your health care provider a list of all the medicines, herbs, non-prescription drugs, or dietary supplements you use. Also tell them if you smoke, drink alcohol, or use illegal drugs. Some items may interact with your medicine. What should I watch for while using this medication? Your condition will be monitored carefully while you are receiving this medicine. You may need blood work done while you are taking this medicine. This medicine may cause a decrease in vitamin B6. You should make sure that you get enough vitamin B6 while you are taking this medicine. Discuss the foods you eat and the vitamins you take with your health care professional. What side effects may I notice from receiving this medication? Side effects that you should report to your doctor or health care professional as soon as possible: allergic reactions like skin rash, itching or hives, swelling of the face, lips, or tongue seizures signs and symptoms of a blood clot such as breathing problems; changes in vision; chest pain; severe, sudden headache; pain, swelling, warmth in the leg; trouble speaking; sudden numbness or weakness of the face, arm or leg signs and symptoms of a stroke like   changes in vision; confusion; trouble speaking or understanding; severe headaches; sudden numbness or weakness of the face, arm or leg; trouble walking; dizziness; loss of balance or coordination Side effects that usually do not require medical attention (report to your doctor or health care professional if they continue or are  bothersome): chills cough dizziness fever headaches joint pain muscle cramps muscle pain nausea, vomiting pain, redness, or irritation at site where injected This list may not describe all possible side effects. Call your doctor for medical advice about side effects. You may report side effects to FDA at 1-800-FDA-1088. Where should I keep my medication? Keep out of the reach of children. Store in a refrigerator between 2 and 8 degrees C (36 and 46 degrees F). Do not freeze or shake. Throw away any unused portion if using a single-dose vial. Multi-dose vials can be kept in the refrigerator for up to 21 days after the initial dose. Throw away unused medicine. NOTE: This sheet is a summary. It may not cover all possible information. If you have questions about this medicine, talk to your doctor, pharmacist, or health care provider.  2022 Elsevier/Gold Standard (2016-08-02 08:35:19)  

## 2020-06-26 NOTE — Patient Instructions (Signed)
Implanted Port Home Guide An implanted port is a device that is placed under the skin. It is usually placed in the chest. The device can be used to give IV medicine, to take blood, or for dialysis. You may have an implanted port if: You need IV medicine that would be irritating to the small veins in your hands or arms. You need IV medicines, such as antibiotics, for a long period of time. You need IV nutrition for a long period of time. You need dialysis. When you have a port, your health care provider can choose to use the port instead of veins in your arms for these procedures. You may have fewer limitations when using a port than you would if you used other types of long-term IVs, and you will likely be able to return to normal activities afteryour incision heals. An implanted port has two main parts: Reservoir. The reservoir is the part where a needle is inserted to give medicines or draw blood. The reservoir is round. After it is placed, it appears as a small, raised area under your skin. Catheter. The catheter is a thin, flexible tube that connects the reservoir to a vein. Medicine that is inserted into the reservoir goes into the catheter and then into the vein. How is my port accessed? To access your port: A numbing cream may be placed on the skin over the port site. Your health care provider will put on a mask and sterile gloves. The skin over your port will be cleaned carefully with a germ-killing soap and allowed to dry. Your health care provider will gently pinch the port and insert a needle into it. Your health care provider will check for a blood return to make sure the port is in the vein and is not clogged. If your port needs to remain accessed to get medicine continuously (constant infusion), your health care provider will place a clear bandage (dressing) over the needle site. The dressing and needle will need to be changed every week, or as told by your health care provider. What  is flushing? Flushing helps keep the port from getting clogged. Follow instructions from your health care provider about how and when to flush the port. Ports are usually flushed with saline solution or a medicine called heparin. The need for flushing will depend on how the port is used: If the port is only used from time to time to give medicines or draw blood, the port may need to be flushed: Before and after medicines have been given. Before and after blood has been drawn. As part of routine maintenance. Flushing may be recommended every 4-6 weeks. If a constant infusion is running, the port may not need to be flushed. Throw away any syringes in a disposal container that is meant for sharp items (sharps container). You can buy a sharps container from a pharmacy, or you can make one by using an empty hard plastic bottle with a cover. How long will my port stay implanted? The port can stay in for as long as your health care provider thinks it is needed. When it is time for the port to come out, a surgery will be done to remove it. The surgery will be similar to the procedure that was done to putthe port in. Follow these instructions at home:  Flush your port as told by your health care provider. If you need an infusion over several days, follow instructions from your health care provider about how to take   care of your port site. Make sure you: Wash your hands with soap and water before you change your dressing. If soap and water are not available, use alcohol-based hand sanitizer. Change your dressing as told by your health care provider. Place any used dressings or infusion bags into a plastic bag. Throw that bag in the trash. Keep the dressing that covers the needle clean and dry. Do not get it wet. Do not use scissors or sharp objects near the tube. Keep the tube clamped, unless it is being used. Check your port site every day for signs of infection. Check for: Redness, swelling, or  pain. Fluid or blood. Pus or a bad smell. Protect the skin around the port site. Avoid wearing bra straps that rub or irritate the site. Protect the skin around your port from seat belts. Place a soft pad over your chest if needed. Bathe or shower as told by your health care provider. The site may get wet as long as you are not actively receiving an infusion. Return to your normal activities as told by your health care provider. Ask your health care provider what activities are safe for you. Carry a medical alert card or wear a medical alert bracelet at all times. This will let health care providers know that you have an implanted port in case of an emergency. Get help right away if: You have redness, swelling, or pain at the port site. You have fluid or blood coming from your port site. You have pus or a bad smell coming from the port site. You have a fever. Summary Implanted ports are usually placed in the chest for long-term IV access. Follow instructions from your health care provider about flushing the port and changing bandages (dressings). Take care of the area around your port by avoiding clothing that puts pressure on the area, and by watching for signs of infection. Protect the skin around your port from seat belts. Place a soft pad over your chest if needed. Get help right away if you have a fever or you have redness, swelling, pain, drainage, or a bad smell at the port site. This information is not intended to replace advice given to you by your health care provider. Make sure you discuss any questions you have with your healthcare provider. Document Revised: 05/10/2019 Document Reviewed: 05/10/2019 Elsevier Patient Education  2022 Elsevier Inc.  

## 2020-06-27 ENCOUNTER — Telehealth: Payer: Self-pay | Admitting: General Practice

## 2020-06-27 ENCOUNTER — Ambulatory Visit (INDEPENDENT_AMBULATORY_CARE_PROVIDER_SITE_OTHER): Payer: Medicare Other | Admitting: General Practice

## 2020-06-27 DIAGNOSIS — Z7901 Long term (current) use of anticoagulants: Secondary | ICD-10-CM

## 2020-06-27 LAB — POCT INR: INR: 1.6 — AB (ref 2.0–3.0)

## 2020-06-27 NOTE — Progress Notes (Signed)
Medical screening examination/treatment/procedure(s) were performed by non-physician practitioner and as supervising physician I was immediately available for consultation/collaboration. I agree with above. Jeannemarie Sawaya, MD   

## 2020-06-27 NOTE — Telephone Encounter (Signed)
Please advise 

## 2020-06-27 NOTE — Telephone Encounter (Signed)
Kathlee Nations, RN @ [facility] called and reported that patient's B/P was low today.  R arm - 96/58 and L arm - 88/54 and HR - 84.  Kathlee Nations had patient hold morning dose of b/p medication today.  Please call Kathlee Nations @ [phone] with further instructions.  Thanks, Villa Herb, RN

## 2020-06-27 NOTE — Patient Instructions (Signed)
Pre visit review using our clinic review tool, if applicable. No additional management support is needed unless otherwise documented below in the visit note.  Please take 1 1/2 tablets today and tomorrow and then continue to take 1 tablet daily.   Re-check in 2 weeks.   Dosing instructions given to Kathlee Nations, RN @ [place] [phone] ** New # for pt is [phone],  Villa Herb, RN also left a VM for patient. *Sent Dr. Elease Hashimoto a note about patient's B/P

## 2020-06-28 NOTE — Telephone Encounter (Signed)
Kathlee Nations is calling back pt did not take his metoprolol succinate on yesterday and his Bp today is 112/83 and HR 73 and has not taking any of his medication at this point.  May leave a detail msg on her secured voice mail if you need to speak with her.

## 2020-06-28 NOTE — Telephone Encounter (Signed)
Spoke with Kathlee Nations, she is aware of the message from Dr. Elease Hashimoto. Nothing further needed.

## 2020-06-28 NOTE — Telephone Encounter (Signed)
Spoke with Gary Yates, she wanted to know what to do as far as taking the metoprolol. She stated the patient always has +2 edema of the legs and is afraid that stopping the lasix will increase edema. Please advise.

## 2020-06-28 NOTE — Telephone Encounter (Signed)
Please advise 

## 2020-06-30 ENCOUNTER — Other Ambulatory Visit: Payer: Self-pay | Admitting: Family Medicine

## 2020-06-30 ENCOUNTER — Other Ambulatory Visit: Payer: Self-pay | Admitting: Physician Assistant

## 2020-06-30 DIAGNOSIS — I872 Venous insufficiency (chronic) (peripheral): Secondary | ICD-10-CM

## 2020-06-30 DIAGNOSIS — E1140 Type 2 diabetes mellitus with diabetic neuropathy, unspecified: Secondary | ICD-10-CM

## 2020-07-02 ENCOUNTER — Other Ambulatory Visit: Payer: Self-pay | Admitting: Physician Assistant

## 2020-07-02 DIAGNOSIS — I872 Venous insufficiency (chronic) (peripheral): Secondary | ICD-10-CM

## 2020-07-04 ENCOUNTER — Telehealth: Payer: Self-pay | Admitting: Family Medicine

## 2020-07-04 ENCOUNTER — Ambulatory Visit (INDEPENDENT_AMBULATORY_CARE_PROVIDER_SITE_OTHER): Payer: Medicare Other | Admitting: General Practice

## 2020-07-04 DIAGNOSIS — I480 Paroxysmal atrial fibrillation: Secondary | ICD-10-CM

## 2020-07-04 LAB — POCT INR: INR: 2 (ref 2.0–3.0)

## 2020-07-04 NOTE — Telephone Encounter (Signed)
Erline Levine, PT w/[facility] ([facility]) calling to get verbal order to continue [facility] PT 2 week 3 1 week 5.  May leave a detail msg on her secured voicemail.

## 2020-07-04 NOTE — Telephone Encounter (Signed)
Spoke with Marzetta Board, verbal orders have been given.

## 2020-07-04 NOTE — Patient Instructions (Signed)
Pre visit review using our clinic review tool, if applicable. No additional management support is needed unless otherwise documented below in the visit note.  Change dosage and take 1 tablet daily except take 1 1/2 tablets on Mondays. Dosing instructions given to Kathlee Nations, RN @ [place] [phone] ** New # for pt is [phone],  Villa Herb, RN also left a VM for patient and daughter, [name].

## 2020-07-04 NOTE — Progress Notes (Signed)
Medical screening examination/treatment/procedure(s) were performed by non-physician practitioner and as supervising physician I was immediately available for consultation/collaboration. I agree with above. Antaeus Karel, MD   

## 2020-07-04 NOTE — Telephone Encounter (Signed)
Ok for verbal orders ?

## 2020-07-18 ENCOUNTER — Ambulatory Visit (INDEPENDENT_AMBULATORY_CARE_PROVIDER_SITE_OTHER): Payer: Medicare Other | Admitting: General Practice

## 2020-07-18 DIAGNOSIS — I480 Paroxysmal atrial fibrillation: Secondary | ICD-10-CM

## 2020-07-18 LAB — POCT INR: INR: 1.9 — AB (ref 2.0–3.0)

## 2020-07-18 NOTE — Patient Instructions (Signed)
Pre visit review using our clinic review tool, if applicable. No additional management support is needed unless otherwise documented below in the visit note.  Take 1 1/2 tablets today and then continue to take 1 tablet daily except take 1 1/2 tablets on Mondays. Dosing instructions given to Maudie Mercury, RN @ [place]  while in patient's home.  pt is [phone].

## 2020-07-18 NOTE — Progress Notes (Signed)
Medical screening examination/treatment/procedure(s) were performed by non-physician practitioner and as supervising physician I was immediately available for consultation/collaboration. I agree with above. Aliena Ghrist, MD   

## 2020-08-01 ENCOUNTER — Ambulatory Visit (INDEPENDENT_AMBULATORY_CARE_PROVIDER_SITE_OTHER): Payer: Medicare Other | Admitting: General Practice

## 2020-08-01 ENCOUNTER — Telehealth (INDEPENDENT_AMBULATORY_CARE_PROVIDER_SITE_OTHER): Payer: Medicare Other | Admitting: Family Medicine

## 2020-08-01 DIAGNOSIS — Z7901 Long term (current) use of anticoagulants: Secondary | ICD-10-CM | POA: Diagnosis not present

## 2020-08-01 DIAGNOSIS — I480 Paroxysmal atrial fibrillation: Secondary | ICD-10-CM

## 2020-08-01 LAB — POCT INR: INR: 2.1 (ref 2.0–3.0)

## 2020-08-01 NOTE — Telephone Encounter (Signed)
Spoke with patient to schedule an appt, stated he needs to talk with daughter due to her being his transportation. He will call back and schedule appt.

## 2020-08-01 NOTE — Progress Notes (Signed)
Medical screening examination/treatment/procedure(s) were performed by non-physician practitioner and as supervising physician I was immediately available for consultation/collaboration. I agree with above. Blythe Veach, MD   

## 2020-08-01 NOTE — Telephone Encounter (Signed)
Kathlee Nations from [facility] called to let Dr. Elease Hashimoto know that the patient is having some low blood sugars.  This morning was 149 then he took his metformin and it was 45  The patient now weighs 191, he has lost a lot of weight since being place on this medication. The nurse thinks that this patient needs to be re-evaluated on this medication.  [place] [phone]

## 2020-08-01 NOTE — Patient Instructions (Signed)
Pre visit review using our clinic review tool, if applicable. No additional management support is needed unless otherwise documented below in the visit note.  Continue to take 1 tablet daily except take 1 1/2 tablets on Mondays. Dosing instructions given to Kathlee Nations, RN @ [place]  while in patient's home. Kathlee Nations [phone] and pt # [phone].

## 2020-08-02 ENCOUNTER — Other Ambulatory Visit: Payer: Self-pay

## 2020-08-02 ENCOUNTER — Ambulatory Visit: Payer: Medicare Other | Admitting: Family Medicine

## 2020-08-02 VITALS — BP 114/60 | HR 87 | Temp 97.5°F | Ht 70.0 in | Wt 200.1 lb

## 2020-08-02 DIAGNOSIS — E538 Deficiency of other specified B group vitamins: Secondary | ICD-10-CM | POA: Diagnosis not present

## 2020-08-02 DIAGNOSIS — E1140 Type 2 diabetes mellitus with diabetic neuropathy, unspecified: Secondary | ICD-10-CM

## 2020-08-02 DIAGNOSIS — Z794 Long term (current) use of insulin: Secondary | ICD-10-CM

## 2020-08-02 DIAGNOSIS — E782 Mixed hyperlipidemia: Secondary | ICD-10-CM

## 2020-08-02 DIAGNOSIS — R5383 Other fatigue: Secondary | ICD-10-CM | POA: Diagnosis not present

## 2020-08-02 NOTE — Progress Notes (Signed)
Established Patient Office Visit  Subjective:  Patient ID: Gary Dollins., male    DOB: 06/23/1933  Age: 87 y.o. MRN: [id number]  CC:  Chief Complaint  Patient presents with   Follow-up    HPI Gary Yates. presents for fairly profound fatigue past couple of weeks especially.  He states his wife passed away back in April 17, 2022 complications of dementia and he has naturally been grieving that.  He does sit outside a lot into about 12 noon each day but states he has been drinking fluids well and staying hydrated.  He denies any recent diarrhea or any vomiting or any chest pains or dyspnea.  He has chronic sleep disturbance related to neuropathy and restless legs.  We had received note from [facility] that he had blood sugar of 45 earlier this week and question of whether we should hold his metformin.  He has lost some weight over the past few months.  We explained that metformin should not drop his sugar that low and he is not taking any other glycemic medications.  That was isolated low.  He denies any classic hypoglycemic symptoms.  No recent A1c.  Past medical history significant for CAD, hypertension, atrial fibrillation, COPD, obstructive sleep apnea, type 2 diabetes with neuropathy, chronic kidney disease, history of non-Hodgkin's lymphoma, chronic anemia.  He gets regular CBC and comprehensive metabolic panel's through hematology/oncology.  He has had low B12 in the past and does take metformin which can reduce absorption but has not had recent B12 level.  He has hyperlipidemia treated with Lipitor with no recent lipid panel.  Denies any significant myalgias.  Past Medical History:  Diagnosis Date   Anemia in chronic renal disease 05/07/2017   Anxiety    Atrial fibrillation (HCC)    COPD (chronic obstructive pulmonary disease) ([place])    pt. denies   Coronary artery disease    a. h/o Overlapping stents RCA;  b. 06/2011 Cath: patent stents, nonobs dzs, NL EF.   Diabetic peripheral  neuropathy (HCC)    Diffuse non-Hodgkin's lymphoma of testis ([place]) 09/28/2015   DM (diabetes mellitus) ([place])    Type 2, peripheral neuropathy.   Dyspnea    with exertion   Dysrhythmia    GERD (gastroesophageal reflux disease)    Headache    History of bronchitis    History of kidney stones    History of radiation therapy 02/19/16 - 03/13/16   Testis/Scrotum: 32.4 Gy in 18 fractions   History of radiation therapy 08/07/16-08/20/16   left adrenal gland mass treated to 30 Gy in 10 fractions   Hyperlipidemia    Hypertension    Iron deficiency anemia due to chronic blood loss 08/08/2017   Low testosterone    Nephrolithiasis    OSA (obstructive sleep apnea) 11/26/2017   Osteoarthritis    shoulder   Restless leg    SVT (supraventricular tachycardia) ([place])    Urinary frequency    Wears partial dentures    upper and lower    Past Surgical History:  Procedure Laterality Date   [place]  01/2013   CATARACT EXTRACTION, BILATERAL     CHOLECYSTECTOMY     COLONOSCOPY     CORONARY ANGIOPLASTY  2004   CYSTOSCOPY N/A 08/18/2017   Procedure: CYSTOSCOPY WITH FULGURATION AND SUPRA PUBIC TUBE PLACEMENT;  Surgeon: Kathie Rhodes, MD;  Location: [facility];  Service: Urology;  Laterality: N/A;  EYE SURGERY Bilateral    cataracts   IR GENERIC HISTORICAL  10/05/2015   IR US GUIDE VASC ACCESS RIGHT 10/05/2015 Marybelle Killings, MD [facility]   IR GENERIC HISTORICAL  10/05/2015   IR FLUORO GUIDE PORT INSERTION RIGHT 10/05/2015 Marybelle Killings, MD [facility]   LEFT HEART CATHETERIZATION WITH CORONARY ANGIOGRAM N/A 06/18/2011   Procedure: LEFT HEART CATHETERIZATION WITH CORONARY ANGIOGRAM;  Surgeon: Peter M Martinique, MD;  Location: [facility] CATH LAB;  Service: Cardiovascular;  Laterality: N/A;   LEFT HEART CATHETERIZATION WITH CORONARY ANGIOGRAM N/A 01/27/2013   Procedure: LEFT HEART CATHETERIZATION WITH CORONARY ANGIOGRAM;  Surgeon: Burnell Blanks, MD;   Location: [facility] CATH LAB;  Service: Cardiovascular;  Laterality: N/A;   LUMBAR LAMINECTOMY/DECOMPRESSION MICRODISCECTOMY N/A 02/07/2015   Procedure: Lumbar three-Sacral one Decompression;  Surgeon: Kevan Ny Ditty, MD;  Location: [place] [facility];  Service: Neurosurgery;  Laterality: N/A;  L3 to S1 Decompression   MULTIPLE TOOTH EXTRACTIONS     ORCHIECTOMY Right 09/01/2015   Procedure: RIGHT ORCHIECTOMY;  Surgeon: Kathie Rhodes, MD;  Location: [facility];  Service: Urology;  Laterality: Right;   port a cath in place      ROTATOR CUFF REPAIR Left     Family History  Problem Relation Age of Onset   Alzheimer's disease Mother    Heart disease Mother    Heart disease Father    Migraines Father    Ulcers Father    Prostate cancer Brother    Coronary artery disease Other        Male 1st degree relative <50   Coronary artery disease Other        male 1st degree relative <60   Heart disease Sister    Obesity Sister        Morbid   Arthritis Sister    Heart disease Brother    Arthritis Brother    Sleep apnea Son    Obesity Son    Migraines Daughter    Thyroid disease Daughter     Social History   Socioeconomic History   Marital status: Married    Spouse name: Not on file   Number of children: 3   Years of education: Not on file   Highest education level: Not on file  Occupational History   Occupation: Retired    Fish farm manager: RETIRED  Tobacco Use   Smoking status: Former    Packs/day: 1.50    Years: 20.00    Pack years: 30.00    Types: Cigarettes    Quit date: 04/05/1978    Years since quitting: 42.3   Smokeless tobacco: Never  Vaping Use   Vaping Use: Never used  Substance and Sexual Activity   Alcohol use: No   Drug use: No   Sexual activity: Yes    Birth control/protection: None    Comment: Married  Other Topics Concern   Not on file  Social History Narrative   Not on file   Social Determinants of Health   Financial Resource Strain: Low Risk    Difficulty of Paying  Living Expenses: Not hard at all  Food Insecurity: No Food Insecurity   Worried About Charity fundraiser in the Last Year: Never true   [place] in the Last Year: Never true  Transportation Needs: No Transportation Needs   Lack of Transportation (Medical): No   Lack of Transportation (Non-Medical): No  Physical Activity: Insufficiently Active   Days of Exercise per Week: 2 days   Minutes  of Exercise per Session: 30 min  Stress: No Stress Concern Present   Feeling of Stress : Not at all  Social Connections: Moderately Integrated   Frequency of Communication with Friends and Family: More than three times a week   Frequency of Social Gatherings with Friends and Family: More than three times a week   Attends Religious Services: More than 4 times per year   Active Member of [employer] or Organizations: No   Attends Archivist Meetings: Never   Marital Status: Married  Human resources officer Violence: Not At Risk   Fear of Current or Ex-Partner: No   Emotionally Abused: No   Physically Abused: No   Sexually Abused: No    Outpatient Medications Prior to Visit  Medication Sig Dispense Refill   acetaminophen (TYLENOL) 500 MG tablet Take 1,000 mg by mouth every 6 (six) hours as needed (for pain/fever/headaches.).      atorvastatin (LIPITOR) 40 MG tablet TAKE 1 TABLET BY MOUTH EVERY DAY 90 tablet 0   chlorhexidine (PERIDEX) 0.12 % solution Use as directed 15 mLs in the mouth or throat 2 (two) times daily.      docusate sodium (COLACE) 50 MG capsule Take 1 capsule (50 mg total) by mouth 2 (two) times daily as needed (for constipation). 90 capsule 0   escitalopram (LEXAPRO) 10 MG tablet TAKE ONE HALF TABLET ONCE DAILY FOR ONE WEEK AND THEN INCREASE TO ONE DAILY. 30 tablet 2   furosemide (LASIX) 40 MG tablet TAKE 1 TABLET BY MOUTH EVERY DAY 90 tablet 1   gabapentin (NEURONTIN) 300 MG capsule TAKE 2 CAPSULES BY MOUTH 3 TIMES A DAY 540 capsule 1   ibuprofen (ADVIL) 200 MG tablet Take 600 mg  by mouth every 3 (three) hours as needed for moderate pain.     metFORMIN (GLUCOPHAGE) 500 MG tablet Take 1 tablet (500 mg total) by mouth 2 (two) times daily with a meal. 180 tablet 0   metoprolol succinate (TOPROL-XL) 25 MG 24 hr tablet TAKE 1 TABLET BY MOUTH EVERY DAY 30 tablet 9   nitroGLYCERIN (NITROSTAT) 0.4 MG SL tablet Place 1 tablet (0.4 mg total) under the tongue every 5 (five) minutes as needed. Chest pain 25 tablet 1   pantoprazole (PROTONIX) 40 MG tablet TAKE 1 TABLET BY MOUTH EVERY DAY 90 tablet 2   polyethylene glycol (MIRALAX / GLYCOLAX) 17 g packet Take 17 g by mouth daily as needed for mild constipation.      potassium chloride SA (KLOR-CON M20) 20 MEQ tablet Take 1 tablet (20 mEq total) by mouth daily. 90 tablet 1   pramipexole (MIRAPEX) 1.5 MG tablet TAKE 2 TABLETS BY MOUTH EVERY EVENING 180 tablet 1   warfarin (COUMADIN) 5 MG tablet Take 1 tablet daily or Take as directed by anticoagulation clinic 90 tablet 1   No facility-administered medications prior to visit.    Allergies  Allergen Reactions   Latex Other (See Comments)    Reddens the skin   Ace Inhibitors Cough   Codeine Nausea Only and Rash        Penicillins Rash    Childhood allergy Has patient had a PCN reaction causing immediate rash, facial/tongue/throat swelling, SOB or lightheadedness with hypotension: Yes Has patient had a PCN reaction causing severe rash involving mucus membranes or skin necrosis: Yes Has patient had a PCN reaction that required hospitalization No Has patient had a PCN reaction occurring within the last 10 years: No If all of the above answers are "NO",  then may proceed with Cephalosporin use.     ROS Review of Systems  Constitutional:  Positive for fatigue. Negative for chills and fever.  Respiratory:  Negative for cough and shortness of breath.   Cardiovascular:  Negative for chest pain and palpitations.  Gastrointestinal:  Negative for abdominal pain, diarrhea, nausea and  vomiting.  Genitourinary:  Negative for dysuria.  Neurological:  Positive for weakness.     Objective:    Physical Exam Vitals reviewed.  Constitutional:      Appearance: Normal appearance.  Cardiovascular:     Rate and Rhythm: Normal rate.  Pulmonary:     Effort: Pulmonary effort is normal.     Breath sounds: Normal breath sounds.  Musculoskeletal:     Right lower leg: No edema.     Left lower leg: No edema.  Neurological:     Mental Status: He is alert.    BP 114/60   Pulse 87   Temp (!) 97.5 F (36.4 C) (Oral)   Ht 5' 10"  (1.778 m)   Wt 200 lb 1.6 oz (90.8 kg)   SpO2 93%   BMI 28.71 kg/m  Wt Readings from Last 3 Encounters:  08/02/20 200 lb 1.6 oz (90.8 kg)  06/26/20 202 lb (91.6 kg)  05/18/20 212 lb (96.2 kg)     Health Maintenance Due  Topic Date Due   FOOT EXAM  08/06/2014   OPHTHALMOLOGY EXAM  07/23/2019   COVID-19 Vaccine (4 - Booster for Pfizer series) 12/20/2019   HEMOGLOBIN A1C  04/10/2020    There are no preventive care reminders to display for this patient.  Lab Results  Component Value Date   TSH 4.795 (H) 08/05/2018   Lab Results  Component Value Date   WBC 5.8 06/26/2020   HGB 10.9 (L) 06/26/2020   HCT 33.5 (L) 06/26/2020   MCV 94.4 06/26/2020   PLT 140 (L) 06/26/2020   Lab Results  Component Value Date   NA 138 06/26/2020   K 4.5 06/26/2020   CHLORIDE 104 12/14/2015   CO2 29 06/26/2020   GLUCOSE 102 (H) 06/26/2020   BUN 38 (H) 06/26/2020   CREATININE 2.26 (H) 06/26/2020   BILITOT 0.5 06/26/2020   ALKPHOS 74 06/26/2020   AST 12 (L) 06/26/2020   ALT 5 06/26/2020   PROT 7.2 06/26/2020   ALBUMIN 4.1 06/26/2020   CALCIUM 9.7 06/26/2020   ANIONGAP 9 06/26/2020   EGFR 76 (L) 12/14/2015   GFR 38.06 (L) 03/22/2019   Lab Results  Component Value Date   CHOL 166 03/22/2019   Lab Results  Component Value Date   HDL 29.80 (L) 03/22/2019   Lab Results  Component Value Date   LDLCALC 53 09/15/2017   Lab Results  Component  Value Date   TRIG 373.0 (H) 03/22/2019   Lab Results  Component Value Date   CHOLHDL 6 03/22/2019   Lab Results  Component Value Date   HGBA1C 6.6 (H) 10/11/2019      Assessment & Plan:   #1 couple week history of increased fatigue.  Etiology not clear.  Possibly multifactorial.  He has chronic insomnia issues.  Does have prior history of low B12 and is not currently taking any replacement.  He is grieving the loss of his wife but denies any major depression symptoms  -Check labs with B12 and TSH  #2 hyperlipidemia treated with Lipitor 40 mg daily -Recheck lipid panel.  Did not check hepatic panel since he gets regular comprehensive chemistries through hematology  #  3 type 2 diabetes.  Improved control with his recent weight loss. -Recheck A1c.  If this is extremely low consider discontinuation of metformin.  #4 past history of B12 deficiency.  Risk factor of metformin use and chronic PPI use -Recheck B12 level.  If low consider IM replacement   No orders of the defined types were placed in this encounter.   Follow-up: Return in about 6 months (around 02/02/2021).    Carolann Littler, MD

## 2020-08-03 LAB — LIPID PANEL
Cholesterol: 120 mg/dL (ref 0–200)
HDL: 36.1 mg/dL — ABNORMAL LOW (ref >39.00)
LDL Cholesterol: 44 mg/dL (ref 0–99)
NonHDL: 83.63
Total CHOL/HDL Ratio: 3
Triglycerides: 196 mg/dL — ABNORMAL HIGH (ref 0.0–149.0)
VLDL: 39.2 mg/dL (ref 0.0–40.0)

## 2020-08-03 LAB — HEMOGLOBIN A1C: Hgb A1c MFr Bld: 6.3 % (ref 4.6–6.5)

## 2020-08-03 LAB — TSH: TSH: 7.72 u[IU]/mL — ABNORMAL HIGH (ref 0.35–5.50)

## 2020-08-03 LAB — VITAMIN B12: Vitamin B-12: 201 pg/mL — ABNORMAL LOW (ref 211–911)

## 2020-08-04 ENCOUNTER — Ambulatory Visit: Payer: Medicare Other | Admitting: Family Medicine

## 2020-08-04 MED ORDER — LEVOTHYROXINE SODIUM 25 MCG PO TABS
25.0000 ug | ORAL_TABLET | Freq: Every day | ORAL | 0 refills | Status: DC
Start: 2020-08-04 — End: 2020-08-23

## 2020-08-04 NOTE — Addendum Note (Signed)
Addended by: Nilda Riggs on: 08/04/2020 11:03 AM   Modules accepted: Orders

## 2020-08-07 ENCOUNTER — Inpatient Hospital Stay: Payer: Medicare Other

## 2020-08-07 ENCOUNTER — Inpatient Hospital Stay: Payer: Medicare Other | Admitting: Hematology & Oncology

## 2020-08-07 ENCOUNTER — Other Ambulatory Visit: Payer: Self-pay

## 2020-08-08 ENCOUNTER — Ambulatory Visit (INDEPENDENT_AMBULATORY_CARE_PROVIDER_SITE_OTHER): Payer: Medicare Other

## 2020-08-08 DIAGNOSIS — E538 Deficiency of other specified B group vitamins: Secondary | ICD-10-CM | POA: Diagnosis not present

## 2020-08-08 MED ORDER — CYANOCOBALAMIN 1000 MCG/ML IJ SOLN
1000.0000 ug | Freq: Once | INTRAMUSCULAR | Status: AC
  Administered 2020-08-08: 1000 ug via INTRAMUSCULAR

## 2020-08-08 NOTE — Progress Notes (Signed)
Per orders from Dr [name], pt was given B12 injection by Camerin Ladouceur, pt tolerated well. 

## 2020-08-12 ENCOUNTER — Other Ambulatory Visit: Payer: Self-pay | Admitting: Family Medicine

## 2020-08-14 NOTE — Telephone Encounter (Signed)
Please advise. This Rx was discontinued on 08/13/2019. Should the patient continue to check their blood sugars?

## 2020-08-15 ENCOUNTER — Ambulatory Visit (INDEPENDENT_AMBULATORY_CARE_PROVIDER_SITE_OTHER): Payer: Medicare Other

## 2020-08-15 ENCOUNTER — Other Ambulatory Visit: Payer: Self-pay

## 2020-08-15 DIAGNOSIS — E538 Deficiency of other specified B group vitamins: Secondary | ICD-10-CM | POA: Diagnosis not present

## 2020-08-15 MED ORDER — CYANOCOBALAMIN 1000 MCG/ML IJ SOLN
1000.0000 ug | Freq: Once | INTRAMUSCULAR | Status: AC
  Administered 2020-08-15: 1000 ug via INTRAMUSCULAR

## 2020-08-15 NOTE — Progress Notes (Signed)
Per orders of Dr. [name], injection of Cyanocobalamin 1000 mcg given by [place] Youngblood L Leena Tiede. Patient tolerated injection well.  

## 2020-08-16 ENCOUNTER — Ambulatory Visit (INDEPENDENT_AMBULATORY_CARE_PROVIDER_SITE_OTHER): Payer: Medicare Other

## 2020-08-16 DIAGNOSIS — Z7901 Long term (current) use of anticoagulants: Secondary | ICD-10-CM

## 2020-08-16 LAB — POCT INR: INR: 2.6 (ref 2.0–3.0)

## 2020-08-16 NOTE — Patient Instructions (Signed)
Pre visit review using our clinic review tool, if applicable. No additional management support is needed unless otherwise documented below in the visit note. 

## 2020-08-22 ENCOUNTER — Other Ambulatory Visit: Payer: Self-pay

## 2020-08-22 ENCOUNTER — Ambulatory Visit (INDEPENDENT_AMBULATORY_CARE_PROVIDER_SITE_OTHER): Payer: Medicare Other

## 2020-08-22 DIAGNOSIS — E538 Deficiency of other specified B group vitamins: Secondary | ICD-10-CM

## 2020-08-22 MED ORDER — CYANOCOBALAMIN 1000 MCG/ML IJ SOLN
1000.0000 ug | Freq: Once | INTRAMUSCULAR | Status: AC
  Administered 2020-08-22: 1000 ug via INTRAMUSCULAR

## 2020-08-22 NOTE — Progress Notes (Signed)
Per orders of Dr. [name], injection of B12 given by Danaria Larsen. Patient tolerated injection well.  

## 2020-08-23 ENCOUNTER — Other Ambulatory Visit: Payer: Self-pay | Admitting: Family Medicine

## 2020-08-28 ENCOUNTER — Other Ambulatory Visit: Payer: Self-pay

## 2020-08-29 ENCOUNTER — Ambulatory Visit (INDEPENDENT_AMBULATORY_CARE_PROVIDER_SITE_OTHER): Payer: Medicare Other | Admitting: *Deleted

## 2020-08-29 DIAGNOSIS — E538 Deficiency of other specified B group vitamins: Secondary | ICD-10-CM

## 2020-08-29 MED ORDER — CYANOCOBALAMIN 1000 MCG/ML IJ SOLN
1000.0000 ug | Freq: Once | INTRAMUSCULAR | Status: AC
  Administered 2020-08-29: 1000 ug via INTRAMUSCULAR

## 2020-08-29 NOTE — Progress Notes (Signed)
Per orders of Dr. [name], injection of B12 given by Theresea Trautmann. Patient tolerated injection well. 

## 2020-08-30 ENCOUNTER — Ambulatory Visit: Payer: Medicare Other | Admitting: Family Medicine

## 2020-08-30 ENCOUNTER — Other Ambulatory Visit: Payer: Self-pay

## 2020-08-30 VITALS — BP 140/60 | HR 85 | Temp 97.4°F | Wt 201.7 lb

## 2020-08-30 DIAGNOSIS — M25511 Pain in right shoulder: Secondary | ICD-10-CM | POA: Diagnosis not present

## 2020-08-30 DIAGNOSIS — M7581 Other shoulder lesions, right shoulder: Secondary | ICD-10-CM | POA: Diagnosis not present

## 2020-08-30 MED ORDER — METHYLPREDNISOLONE ACETATE 40 MG/ML IJ SUSP
40.0000 mg | Freq: Once | INTRAMUSCULAR | Status: AC
  Administered 2020-08-30: 40 mg via INTRA_ARTICULAR

## 2020-08-30 MED ORDER — METHYLPREDNISOLONE ACETATE 40 MG/ML IJ SUSP: 40.0000 mg | Freq: Once | INTRAMUSCULAR | Status: DC

## 2020-08-30 NOTE — Progress Notes (Signed)
Established Patient Office Visit  Subjective:  Patient ID: Gary Goffe., male    DOB: [birth year]  Age: 87 y.o. MRN: [id number]  CC:  Chief Complaint  Patient presents with   Fall    X 2 weeks, Right shoulder pain, deltoid area    HPI Gary Whipp. presents for right shoulder pain.  Couple weeks ago he lost his balance in his kitchen and fell into some cabinets.  There is no bruising.  No visible swelling.  He had some pain with internal rotation and abduction since then.  No neck pain.  No loss of consciousness with the fall.  Pain particularly raising shoulder overhead.  He tried some Tylenol with minimal improvement.  Denies any wrist or forearm pain.  No elbow pain.  Past Medical History:  Diagnosis Date   Anemia in chronic renal disease 05/07/2017   Anxiety    Atrial fibrillation (HCC)    COPD (chronic obstructive pulmonary disease) ([place])    pt. denies   Coronary artery disease    a. h/o Overlapping stents RCA;  b. 06/2011 Cath: patent stents, nonobs dzs, NL EF.   Diabetic peripheral neuropathy (HCC)    Diffuse non-Hodgkin's lymphoma of testis ([place]) 09/28/2015   DM (diabetes mellitus) ([place])    Type 2, peripheral neuropathy.   Dyspnea    with exertion   Dysrhythmia    GERD (gastroesophageal reflux disease)    Headache    History of bronchitis    History of kidney stones    History of radiation therapy 02/19/16 - 03/13/16   Testis/Scrotum: 32.4 Gy in 18 fractions   History of radiation therapy 08/07/16-08/20/16   left adrenal gland mass treated to 30 Gy in 10 fractions   Hyperlipidemia    Hypertension    Iron deficiency anemia due to chronic blood loss 08/08/2017   Low testosterone    Nephrolithiasis    OSA (obstructive sleep apnea) 11/26/2017   Osteoarthritis    shoulder   Restless leg    SVT (supraventricular tachycardia) ([place])    Urinary frequency    Wears partial dentures    upper and lower    Past Surgical History:  Procedure Laterality Date    [place]  01/2013   CATARACT EXTRACTION, BILATERAL     CHOLECYSTECTOMY     COLONOSCOPY     CORONARY ANGIOPLASTY  2004   CYSTOSCOPY N/A 08/18/2017   Procedure: CYSTOSCOPY WITH FULGURATION AND SUPRA PUBIC TUBE PLACEMENT;  Surgeon: Kathie Rhodes, MD;  Location: [facility];  Service: Urology;  Laterality: N/A;   EYE SURGERY Bilateral    cataracts   IR GENERIC HISTORICAL  10/05/2015   IR US GUIDE VASC ACCESS RIGHT 10/05/2015 Marybelle Killings, MD [facility]   IR GENERIC HISTORICAL  10/05/2015   IR FLUORO GUIDE PORT INSERTION RIGHT 10/05/2015 Marybelle Killings, MD [facility]   LEFT HEART CATHETERIZATION WITH CORONARY ANGIOGRAM N/A 06/18/2011   Procedure: LEFT HEART CATHETERIZATION WITH CORONARY ANGIOGRAM;  Surgeon: Peter M Martinique, MD;  Location: [facility] CATH LAB;  Service: Cardiovascular;  Laterality: N/A;   LEFT HEART CATHETERIZATION WITH CORONARY ANGIOGRAM N/A 01/27/2013   Procedure: LEFT HEART CATHETERIZATION WITH CORONARY ANGIOGRAM;  Surgeon: Burnell Blanks, MD;  Location[facility] C[facility] LAB;  Service: Cardiovascular;  Laterality: N/A;   LUMBAR LAMINECTOMY/DECOMPRESSION MICRODISCECTOMY N/A 02/07/2015   Procedure: Lumbar three-Sacral one Decompression;  Surgeon: Kevan Ny Ditty, MD;  Location: [facility];  Service: Neurosurgery;  Laterality: N/A;  L3 to S1 Decompression   MULTIPLE TOOTH EXTRACTIONS     ORCHIECTOMY Right 09/01/2015   Procedure: RIGHT ORCHIECTOMY;  Surgeon: Kathie Rhodes, MD;  Location: [facility];  Service: Urology;  Laterality: Right;   port a cath in place      ROTATOR CUFF REPAIR Left     Family History  Problem Relation Age of Onset   Alzheimer's disease Mother    Heart disease Mother    Heart disease Father    Migraines Father    Ulcers Father    Prostate cancer Brother    Coronary artery disease Other        Male 1st degree relative <50   Coronary artery disease Other        male 1st degree relative <60   Heart disease  Sister    Obesity Sister        Morbid   Arthritis Sister    Heart disease Brother    Arthritis Brother    Sleep apnea Son    Obesity Son    Migraines Daughter    Thyroid disease Daughter     Social History   Socioeconomic History   Marital status: Married    Spouse name: Not on file   Number of children: 3   Years of education: Not on file   Highest education level: Not on file  Occupational History   Occupation: Retired    Fish farm manager: RETIRED  Tobacco Use   Smoking status: Former    Packs/day: 1.50    Years: 20.00    Pack years: 30.00    Types: Cigarettes    Quit date: 04/05/1978    Years since quitting: 42.4   Smokeless tobacco: Never  Vaping Use   Vaping Use: Never used  Substance and Sexual Activity   Alcohol use: No   Drug use: No   Sexual activity: Yes    Birth control/protection: None    Comment: Married  Other Topics Concern   Not on file  Social History Narrative   Not on file   Social Determinants of Health   Financial Resource Strain: Low Risk    Difficulty of Paying Living Expenses: Not hard at all  Food Insecurity: No Food Insecurity   Worried About Charity fundraiser in the Last Year: Never true   [place] in the Last Year: Never true  Transportation Needs: No Transportation Needs   Lack of Transportation (Medical): No   Lack of Transportation (Non-Medical): No  Physical Activity: Insufficiently Active   Days of Exercise per Week: 2 days   Minutes of Exercise per Session: 30 min  Stress: No Stress Concern Present   Feeling of Stress : Not at all  Social Connections: Moderately Integrated   Frequency of Communication with Friends and Family: More than three times a week   Frequency of Social Gatherings with Friends and Family: More than three times a week   Attends Religious Services: More than 4 times per year   Active Member of [employer] or Organizations: No   Attends Archivist Meetings: Never   Marital Status: Married   Human resources officer Violence: Not At Risk   Fear of Current or Ex-Partner: No   Emotionally Abused: No   Physically Abused: No   Sexually Abused: No    Outpatient Medications Prior to Visit  Medication Sig Dispense Refill   acetaminophen (TYLENOL) 500 MG tablet Take 1,000 mg by mouth  every 6 (six) hours as needed (for pain/fever/headaches.).      atorvastatin (LIPITOR) 40 MG tablet TAKE 1 TABLET BY MOUTH EVERY DAY 90 tablet 0   chlorhexidine (PERIDEX) 0.12 % solution Use as directed 15 mLs in the mouth or throat 2 (two) times daily.      docusate sodium (COLACE) 50 MG capsule Take 1 capsule (50 mg total) by mouth 2 (two) times daily as needed (for constipation). 90 capsule 0   escitalopram (LEXAPRO) 10 MG tablet TAKE ONE HALF TABLET ONCE DAILY FOR ONE WEEK AND THEN INCREASE TO ONE DAILY. 30 tablet 2   furosemide (LASIX) 40 MG tablet TAKE 1 TABLET BY MOUTH EVERY DAY 90 tablet 1   gabapentin (NEURONTIN) 300 MG capsule TAKE 2 CAPSULES BY MOUTH 3 TIMES A DAY 540 capsule 1   glucose blood (ACCU-CHEK AVIVA PLUS) test strip Test 2 times a day. 300 strip 1   ibuprofen (ADVIL) 200 MG tablet Take 600 mg by mouth every 3 (three) hours as needed for moderate pain.     levothyroxine (SYNTHROID) 25 MCG tablet TAKE 1 TABLET BY MOUTH EVERY DAY 90 tablet 0   metFORMIN (GLUCOPHAGE) 500 MG tablet Take 1 tablet (500 mg total) by mouth 2 (two) times daily with a meal. 180 tablet 0   metoprolol succinate (TOPROL-XL) 25 MG 24 hr tablet TAKE 1 TABLET BY MOUTH EVERY DAY 30 tablet 9   nitroGLYCERIN (NITROSTAT) 0.4 MG SL tablet Place 1 tablet (0.4 mg total) under the tongue every 5 (five) minutes as needed. Chest pain 25 tablet 1   pantoprazole (PROTONIX) 40 MG tablet TAKE 1 TABLET BY MOUTH EVERY DAY 90 tablet 2   polyethylene glycol (MIRALAX / GLYCOLAX) 17 g packet Take 17 g by mouth daily as needed for mild constipation.      potassium chloride SA (KLOR-CON M20) 20 MEQ tablet Take 1 tablet (20 mEq total) by mouth  daily. 90 tablet 1   pramipexole (MIRAPEX) 1.5 MG tablet TAKE 2 TABLETS BY MOUTH EVERY EVENING 180 tablet 1   warfarin (COUMADIN) 5 MG tablet Take 1 tablet daily or Take as directed by anticoagulation clinic 90 tablet 1   No facility-administered medications prior to visit.    Allergies  Allergen Reactions   Latex Other (See Comments)    Reddens the skin   Ace Inhibitors Cough   Codeine Nausea Only and Rash        Penicillins Rash    Childhood allergy Has patient had a PCN reaction causing immediate rash, facial/tongue/throat swelling, SOB or lightheadedness with hypotension: Yes Has patient had a PCN reaction causing severe rash involving mucus membranes or skin necrosis: Yes Has patient had a PCN reaction that required hospitalization No Has patient had a PCN reaction occurring within the last 10 years: No If all of the above answers are "NO", then may proceed with Cephalosporin use.     ROS Review of Systems  Constitutional:  Negative for chills and fever.  Cardiovascular:  Negative for chest pain.  Musculoskeletal:  Negative for neck pain.  Neurological:  Negative for weakness.     Objective:    Physical Exam Vitals reviewed.  Constitutional:      Appearance: Normal appearance.  Cardiovascular:     Rate and Rhythm: Normal rate.  Pulmonary:     Effort: Pulmonary effort is normal.  Musculoskeletal:     Comments: Right shoulder reveals no visible swelling or bruising.  He has fairly good range of motion but does  have pain with abduction greater than 90 degrees against resistance and also some pain with internal rotation.  No acromioclavicular tenderness.  No biceps tenderness.  No proximal humerus tenderness.  Neurological:     Mental Status: He is alert.    BP 140/60 (BP Location: Left Arm, Patient Position: Sitting, Cuff Size: Normal)   Pulse 85   Temp (!) 97.4 F (36.3 C) (Oral)   Wt 201 lb 11.2 oz (91.5 kg)   SpO2 97%   BMI 28.94 kg/m  Wt Readings from  Last 3 Encounters:  08/30/20 201 lb 11.2 oz (91.5 kg)  08/02/20 200 lb 1.6 oz (90.8 kg)  06/26/20 202 lb (91.6 kg)     Health Maintenance Due  Topic Date Due   FOOT EXAM  08/06/2014   OPHTHALMOLOGY EXAM  07/23/2019   COVID-19 Vaccine (4 - Booster for Pfizer series) 12/20/2019   INFLUENZA VACCINE  08/07/2020    There are no preventive care reminders to display for this patient.  Lab Results  Component Value Date   TSH 7.72 (H) 08/02/2020   Lab Results  Component Value Date   WBC 5.8 06/26/2020   HGB 10.9 (L) 06/26/2020   HCT 33.5 (L) 06/26/2020   MCV 94.4 06/26/2020   PLT 140 (L) 06/26/2020   Lab Results  Component Value Date   NA 138 06/26/2020   K 4.5 06/26/2020   CHLORIDE 104 12/14/2015   CO2 29 06/26/2020   GLUCOSE 102 (H) 06/26/2020   BUN 38 (H) 06/26/2020   CREATININE 2.26 (H) 06/26/2020   BILITOT 0.5 06/26/2020   ALKPHOS 74 06/26/2020   AST 12 (L) 06/26/2020   ALT 5 06/26/2020   PROT 7.2 06/26/2020   ALBUMIN 4.1 06/26/2020   CALCIUM 9.7 06/26/2020   ANIONGAP 9 06/26/2020   EGFR 76 (L) 12/14/2015   GFR 38.06 (L) 03/22/2019   Lab Results  Component Value Date   CHOL 120 08/02/2020   Lab Results  Component Value Date   HDL 36.10 (L) 08/02/2020   Lab Results  Component Value Date   LDLCALC 44 08/02/2020   Lab Results  Component Value Date   TRIG 196.0 (H) 08/02/2020   Lab Results  Component Value Date   CHOLHDL 3 08/02/2020   Lab Results  Component Value Date   HGBA1C 6.3 08/02/2020      Assessment & Plan:   Problem List Items Addressed This Visit   None Visit Diagnoses     Acute pain of right shoulder    -  Primary   Relevant Medications   methylPREDNISolone acetate (DEPO-MEDROL) injection 40 mg (Completed)     Patient presents with right shoulder pain.  He did have recent fall but does not have any visible bruising or swelling.  We discussed plain x-ray imaging but he is no longer driving and we have no one here to do x-ray  today.  Fairly low clinical suspicion for fracture given lack of bruising or swelling and also lack of localized tenderness.  Hopefully all represents some rotator cuff inflammation.  Doubt rotator cuff tear based on exam.  Patient requesting injection.  He has had some increased risk for adhesive capsulitis.   Discussed risks and benefits of corticosteroid injection and patient consented.  After prepping skin with betadine, injected 40 mg depomedrol and 2 cc of plain xylocaine with 25 gauge one and one half inch needle using posterior lateral approach and pt tolerated well. -Recommend try some icing 15 to 20 minutes 2-3 times  daily as needed -Gentle range of motion as tolerated -Be in touch if not improved in the next 1 to 2 weeks  Meds ordered this encounter  Medications   DISCONTD: methylPREDNISolone acetate (DEPO-MEDROL) injection 40 mg   methylPREDNISolone acetate (DEPO-MEDROL) injection 40 mg    Follow-up: No follow-ups on file.    Carolann Littler, MD

## 2020-08-31 ENCOUNTER — Telehealth: Payer: Self-pay

## 2020-08-31 ENCOUNTER — Ambulatory Visit (INDEPENDENT_AMBULATORY_CARE_PROVIDER_SITE_OTHER): Payer: Medicare Other

## 2020-08-31 DIAGNOSIS — Z7901 Long term (current) use of anticoagulants: Secondary | ICD-10-CM | POA: Diagnosis not present

## 2020-08-31 LAB — POCT INR: INR: 3.2 — AB (ref 2.0–3.0)

## 2020-08-31 NOTE — Progress Notes (Signed)
Medical screening examination/treatment/procedure(s) were performed by non-physician practitioner and as supervising physician I was immediately available for consultation/collaboration. I agree with above. Latasha Buczkowski, MD   

## 2020-08-31 NOTE — Patient Instructions (Signed)
Pre visit review using our clinic review tool, if applicable. No additional management support is needed unless otherwise documented below in the visit note. 

## 2020-08-31 NOTE — Telephone Encounter (Signed)
Please advise 

## 2020-08-31 NOTE — Telephone Encounter (Signed)
Erline Levine from [facility] called requesting approval for recertification for physical therapy call back # [phone] ok the leave detailed message.

## 2020-09-01 NOTE — Telephone Encounter (Signed)
Spoke with Erline Levine, verbal order given

## 2020-09-05 ENCOUNTER — Telehealth: Payer: Self-pay | Admitting: Family Medicine

## 2020-09-05 NOTE — Telephone Encounter (Signed)
Spoke with Gary Yates, order was given. She stated that they do not do the braces them self but said that [employer] is able to fit him for the brace. Please advise.

## 2020-09-05 NOTE — Telephone Encounter (Signed)
Gary Yates, physical therapist called for continuation for PT One week eight.  Would like to know if Dr.[name] would like to get patient fitted for AFO for right foot. Patient keeps falling because he has limited DF because he is weak in right foot. Can call and leave a message at [phone]      Please Advise

## 2020-09-12 ENCOUNTER — Other Ambulatory Visit: Payer: Self-pay

## 2020-09-12 ENCOUNTER — Ambulatory Visit: Payer: Medicare Other | Admitting: Family Medicine

## 2020-09-12 ENCOUNTER — Encounter: Payer: Self-pay | Admitting: Family Medicine

## 2020-09-12 ENCOUNTER — Ambulatory Visit (INDEPENDENT_AMBULATORY_CARE_PROVIDER_SITE_OTHER): Payer: Medicare Other

## 2020-09-12 VITALS — BP 92/50 | HR 76 | Temp 97.8°F | Wt 198.9 lb

## 2020-09-12 DIAGNOSIS — I959 Hypotension, unspecified: Secondary | ICD-10-CM | POA: Diagnosis not present

## 2020-09-12 DIAGNOSIS — R531 Weakness: Secondary | ICD-10-CM

## 2020-09-12 DIAGNOSIS — Z7901 Long term (current) use of anticoagulants: Secondary | ICD-10-CM

## 2020-09-12 LAB — POCT INR: INR: 1.6 — AB (ref 2.0–3.0)

## 2020-09-12 NOTE — Progress Notes (Signed)
Medical screening examination/treatment/procedure(s) were performed by non-physician practitioner and as supervising physician I was immediately available for consultation/collaboration. I agree with above. Nikhita Mentzel, MD   

## 2020-09-12 NOTE — Patient Instructions (Addendum)
Pre visit review using our clinic review tool, if applicable. No additional management support is needed unless otherwise documented below in the visit note.  Increase dose today and tomorrow to 1 1/2 tablets and then continue to take 1 tablet daily except take 1 1/2 tablets on Mondays. Recheck in 2 wks.

## 2020-09-12 NOTE — Patient Instructions (Signed)
Hold the Furosemide for now until you hear back about the labs.

## 2020-09-12 NOTE — Progress Notes (Signed)
Established Patient Office Visit  Subjective:  Patient ID: Gary Yates., male    DOB: 02/09/1933  Age: 87 y.o. MRN: [id number]  CC:  Chief Complaint  Patient presents with   Shaking    Dizziness, lightheaded, ringing in left ear. Shaking, just not feeling well    HPI Gary Yates. presents for complaints of dizziness, lightheadedness, ringing left ear, and some increased tremor past few days.  Multiple chronic problems as below.  He has been on furosemide 40 mg daily for some time for peripheral edema.  They had recent low blood pressure of 92/50 when checked at home.  Does have some lightheadedness with standing.  No syncope.  Has poor appetite.  Denies any nausea, vomiting, or diarrhea.  Patient was recently started on Lexapro for depression symptoms and thus far has not seen any real improvement.  His wife passed away during the past year and has had very difficult time adjusting to this.  He has generalized weakness.  His medications include atorvastatin, Lexapro, furosemide 40 mg daily, gabapentin, low-dose levothyroxine which was recently initiated, metformin, metoprolol, Protonix, potassium, Mirapex, Coumadin.  Recent TSH over 7 and we started low-dose levothyroxine 25 mcg daily.  This was just a little over a month ago.  Past Medical History:  Diagnosis Date   Anemia in chronic renal disease 05/07/2017   Anxiety    Atrial fibrillation (HCC)    COPD (chronic obstructive pulmonary disease) ([place])    pt. denies   Coronary artery disease    a. h/o Overlapping stents RCA;  b. 06/2011 Cath: patent stents, nonobs dzs, NL EF.   Diabetic peripheral neuropathy (HCC)    Diffuse non-Hodgkin's lymphoma of testis ([place]) 09/28/2015   DM (diabetes mellitus) ([place])    Type 2, peripheral neuropathy.   Dyspnea    with exertion   Dysrhythmia    GERD (gastroesophageal reflux disease)    Headache    History of bronchitis    History of kidney stones    History of radiation therapy 02/19/16 -  03/13/16   Testis/Scrotum: 32.4 Gy in 18 fractions   History of radiation therapy 08/07/16-08/20/16   left adrenal gland mass treated to 30 Gy in 10 fractions   Hyperlipidemia    Hypertension    Iron deficiency anemia due to chronic blood loss 08/08/2017   Low testosterone    Nephrolithiasis    OSA (obstructive sleep apnea) 11/26/2017   Osteoarthritis    shoulder   Restless leg    SVT (supraventricular tachycardia) ([place])    Urinary frequency    Wears partial dentures    upper and lower    Past Surgical History:  Procedure Laterality Date   [place]  01/2013   CATARACT EXTRACTION, BILATERAL     CHOLECYSTECTOMY     COLONOSCOPY     CORONARY ANGIOPLASTY  2004   CYSTOSCOPY N/A 08/18/2017   Procedure: CYSTOSCOPY WITH FULGURATION AND SUPRA PUBIC TUBE PLACEMENT;  Surgeon: Kathie Rhodes, MD;  Location: [facility];  Service: Urology;  Laterality: N/A;   EYE SURGERY Bilateral    cataracts   IR GENERIC HISTORICAL  10/05/2015   IR US GUIDE VASC ACCESS RIGHT 10/05/2015 Marybelle Killings, MD [facility]   IR GENERIC HISTORICAL  10/05/2015   IR FLUORO GUIDE PORT INSERTION RIGHT 10/05/2015 Marybelle Killings, MD [facility]   LEFT HEART CATHETERIZATION WITH CORONARY ANGIOGRAM N/A 06/18/2011   Procedure:  LEFT HEART CATHETERIZATION WITH CORONARY ANGIOGRAM;  Surgeon: Peter M Martinique, MD;  Location: [facility] CATH LAB;  Service: Cardiovascular;  Laterality: N/A;   LEFT HEART CATHETERIZATION WITH CORONARY ANGIOGRAM N/A 01/27/2013   Procedure: LEFT HEART CATHETERIZATION WITH CORONARY ANGIOGRAM;  Surgeon: Burnell Blanks, MD;  Location: [facility] CATH LAB;  Service: Cardiovascular;  Laterality: N/A;   LUMBAR LAMINECTOMY/DECOMPRESSION MICRODISCECTOMY N/A 02/07/2015   Procedure: Lumbar three-Sacral one Decompression;  Surgeon: Kevan Ny Ditty, MD;  Location: [facility];  Service: Neurosurgery;  Laterality: N/A;  L3 to S1 Decompression   MULTIPLE TOOTH EXTRACTIONS      ORCHIECTOMY Right 09/01/2015   Procedure: RIGHT ORCHIECTOMY;  Surgeon: Kathie Rhodes, MD;  Location: [facility];  Service: Urology;  Laterality: Right;   port a cath in place      ROTATOR CUFF REPAIR Left     Family History  Problem Relation Age of Onset   Alzheimer's disease Mother    Heart disease Mother    Heart disease Father    Migraines Father    Ulcers Father    Prostate cancer Brother    Coronary artery disease Other        Male 1st degree relative <50   Coronary artery disease Other        male 1st degree relative <60   Heart disease Sister    Obesity Sister        Morbid   Arthritis Sister    Heart disease Brother    Arthritis Brother    Sleep apnea Son    Obesity Son    Migraines Daughter    Thyroid disease Daughter     Social History   Socioeconomic History   Marital status: Married    Spouse name: Not on file   Number of children: 3   Years of education: Not on file   Highest education level: Not on file  Occupational History   Occupation: Retired    Fish farm manager: RETIRED  Tobacco Use   Smoking status: Former    Packs/day: 1.50    Years: 20.00    Pack years: 30.00    Types: Cigarettes    Quit date: 04/05/1978    Years since quitting: 42.4   Smokeless tobacco: Never  Vaping Use   Vaping Use: Never used  Substance and Sexual Activity   Alcohol use: No   Drug use: No   Sexual activity: Yes    Birth control/protection: None    Comment: Married  Other Topics Concern   Not on file  Social History Narrative   Not on file   Social Determinants of Health   Financial Resource Strain: Low Risk    Difficulty of Paying Living Expenses: Not hard at all  Food Insecurity: No Food Insecurity   Worried About Charity fundraiser in the Last Year: Never true   [place] in the Last Year: Never true  Transportation Needs: No Transportation Needs   Lack of Transportation (Medical): No   Lack of Transportation (Non-Medical): No  Physical Activity:  Insufficiently Active   Days of Exercise per Week: 2 days   Minutes of Exercise per Session: 30 min  Stress: No Stress Concern Present   Feeling of Stress : Not at all  Social Connections: Moderately Integrated   Frequency of Communication with Friends and Family: More than three times a week   Frequency of Social Gatherings with Friends and Family: More than three times a week   Attends Religious Services:  More than 4 times per year   Active Member of Clubs or Organizations: No   Attends Archivist Meetings: Never   Marital Status: Married  Human resources officer Violence: Not At Risk   Fear of Current or Ex-Partner: No   Emotionally Abused: No   Physically Abused: No   Sexually Abused: No    Outpatient Medications Prior to Visit  Medication Sig Dispense Refill   acetaminophen (TYLENOL) 500 MG tablet Take 1,000 mg by mouth every 6 (six) hours as needed (for pain/fever/headaches.).      atorvastatin (LIPITOR) 40 MG tablet TAKE 1 TABLET BY MOUTH EVERY DAY 90 tablet 0   chlorhexidine (PERIDEX) 0.12 % solution Use as directed 15 mLs in the mouth or throat 2 (two) times daily.      docusate sodium (COLACE) 50 MG capsule Take 1 capsule (50 mg total) by mouth 2 (two) times daily as needed (for constipation). 90 capsule 0   escitalopram (LEXAPRO) 10 MG tablet TAKE ONE HALF TABLET ONCE DAILY FOR ONE WEEK AND THEN INCREASE TO ONE DAILY. 30 tablet 2   furosemide (LASIX) 40 MG tablet TAKE 1 TABLET BY MOUTH EVERY DAY 90 tablet 1   gabapentin (NEURONTIN) 300 MG capsule TAKE 2 CAPSULES BY MOUTH 3 TIMES A DAY 540 capsule 1   glucose blood (ACCU-CHEK AVIVA PLUS) test strip Test 2 times a day. 300 strip 1   ibuprofen (ADVIL) 200 MG tablet Take 600 mg by mouth every 3 (three) hours as needed for moderate pain.     levothyroxine (SYNTHROID) 25 MCG tablet TAKE 1 TABLET BY MOUTH EVERY DAY 90 tablet 0   metFORMIN (GLUCOPHAGE) 500 MG tablet Take 1 tablet (500 mg total) by mouth 2 (two) times daily with a  meal. 180 tablet 0   metoprolol succinate (TOPROL-XL) 25 MG 24 hr tablet TAKE 1 TABLET BY MOUTH EVERY DAY 30 tablet 9   nitroGLYCERIN (NITROSTAT) 0.4 MG SL tablet Place 1 tablet (0.4 mg total) under the tongue every 5 (five) minutes as needed. Chest pain 25 tablet 1   pantoprazole (PROTONIX) 40 MG tablet TAKE 1 TABLET BY MOUTH EVERY DAY 90 tablet 2   polyethylene glycol (MIRALAX / GLYCOLAX) 17 g packet Take 17 g by mouth daily as needed for mild constipation.      potassium chloride SA (KLOR-CON M20) 20 MEQ tablet Take 1 tablet (20 mEq total) by mouth daily. 90 tablet 1   pramipexole (MIRAPEX) 1.5 MG tablet TAKE 2 TABLETS BY MOUTH EVERY EVENING 180 tablet 1   warfarin (COUMADIN) 5 MG tablet Take 1 tablet daily or Take as directed by anticoagulation clinic 90 tablet 1   No facility-administered medications prior to visit.    Allergies  Allergen Reactions   Latex Other (See Comments)    Reddens the skin   Ace Inhibitors Cough   Codeine Nausea Only and Rash        Penicillins Rash    Childhood allergy Has patient had a PCN reaction causing immediate rash, facial/tongue/throat swelling, SOB or lightheadedness with hypotension: Yes Has patient had a PCN reaction causing severe rash involving mucus membranes or skin necrosis: Yes Has patient had a PCN reaction that required hospitalization No Has patient had a PCN reaction occurring within the last 10 years: No If all of the above answers are "NO", then may proceed with Cephalosporin use.     ROS Review of Systems  Constitutional:  Positive for appetite change and fatigue. Negative for chills and fever.  Respiratory:  Negative for cough.   Cardiovascular:  Negative for chest pain.  Gastrointestinal:  Negative for abdominal pain.  Genitourinary:  Negative for dysuria.  Neurological:  Positive for dizziness, tremors and light-headedness. Negative for seizures, syncope, facial asymmetry and speech difficulty.     Objective:     Physical Exam Vitals reviewed.  Cardiovascular:     Rate and Rhythm: Normal rate and regular rhythm.  Pulmonary:     Effort: Pulmonary effort is normal.     Breath sounds: Normal breath sounds.  Musculoskeletal:     Comments: He has support hose on bilaterally.  No significant peripheral edema noted.  Neurological:     General: No focal deficit present.     Mental Status: He is alert.     Cranial Nerves: No cranial nerve deficit.     Comments: Generally weak throughout but no focal weakness.    BP 108/60 (BP Location: Left Arm, Patient Position: Sitting, Cuff Size: Normal)   Pulse 76   Temp 97.8 F (36.6 C) (Oral)   Wt 198 lb 14.4 oz (90.2 kg)   SpO2 96%   BMI 28.54 kg/m  Wt Readings from Last 3 Encounters:  09/12/20 198 lb 14.4 oz (90.2 kg)  08/30/20 201 lb 11.2 oz (91.5 kg)  08/02/20 200 lb 1.6 oz (90.8 kg)     Health Maintenance Due  Topic Date Due   FOOT EXAM  08/06/2014   OPHTHALMOLOGY EXAM  07/23/2019   COVID-19 Vaccine (4 - Booster for Pfizer series) 12/20/2019   INFLUENZA VACCINE  08/07/2020    There are no preventive care reminders to display for this patient.  Lab Results  Component Value Date   TSH 7.72 (H) 08/02/2020   Lab Results  Component Value Date   WBC 5.8 06/26/2020   HGB 10.9 (L) 06/26/2020   HCT 33.5 (L) 06/26/2020   MCV 94.4 06/26/2020   PLT 140 (L) 06/26/2020   Lab Results  Component Value Date   NA 138 06/26/2020   K 4.5 06/26/2020   CHLORIDE 104 12/14/2015   CO2 29 06/26/2020   GLUCOSE 102 (H) 06/26/2020   BUN 38 (H) 06/26/2020   CREATININE 2.26 (H) 06/26/2020   BILITOT 0.5 06/26/2020   ALKPHOS 74 06/26/2020   AST 12 (L) 06/26/2020   ALT 5 06/26/2020   PROT 7.2 06/26/2020   ALBUMIN 4.1 06/26/2020   CALCIUM 9.7 06/26/2020   ANIONGAP 9 06/26/2020   EGFR 76 (L) 12/14/2015   GFR 38.06 (L) 03/22/2019   Lab Results  Component Value Date   CHOL 120 08/02/2020   Lab Results  Component Value Date   HDL 36.10 (L)  08/02/2020   Lab Results  Component Value Date   LDLCALC 44 08/02/2020   Lab Results  Component Value Date   TRIG 196.0 (H) 08/02/2020   Lab Results  Component Value Date   CHOLHDL 3 08/02/2020   Lab Results  Component Value Date   HGBA1C 6.3 08/02/2020      Assessment & Plan:   Problem List Items Addressed This Visit   None Visit Diagnoses     Weakness    -  Primary   Relevant Orders   CMP   CBC with Differential/Platelet   Hypotension, unspecified hypotension type         Patient presents with some progressive weakness which is generalized along with fatigue and some ongoing weight loss.  He has elevated TSH and we recently initiated low-dose levothyroxine.  He does  have blood pressure drop today from 114/60 seated to 92/50 standing.  -Hold Lasix for now -Check labs with CBC and CMP -Watch for any recurrent edema.  We may need to start back his Lasix at a lower dose. -Rule out hyponatremia with recent initiation of Lexapro -We will need repeat TSH in a month or 2  No orders of the defined types were placed in this encounter.   Follow-up: No follow-ups on file.    Carolann Littler, MD

## 2020-09-13 ENCOUNTER — Telehealth: Payer: Self-pay | Admitting: Family Medicine

## 2020-09-13 LAB — CBC WITH DIFFERENTIAL/PLATELET
Basophils Absolute: 0 10*3/uL (ref 0.0–0.1)
Basophils Relative: 0.9 % (ref 0.0–3.0)
Eosinophils Absolute: 0.1 10*3/uL (ref 0.0–0.7)
Eosinophils Relative: 3 % (ref 0.0–5.0)
HCT: 30 % — ABNORMAL LOW (ref 39.0–52.0)
Hemoglobin: 10 g/dL — ABNORMAL LOW (ref 13.0–17.0)
Lymphocytes Relative: 30 % (ref 12.0–46.0)
Lymphs Abs: 1.4 10*3/uL (ref 0.7–4.0)
MCHC: 33.3 g/dL (ref 30.0–36.0)
MCV: 94.1 fl (ref 78.0–100.0)
Monocytes Absolute: 0.3 10*3/uL (ref 0.1–1.0)
Monocytes Relative: 7.1 % (ref 3.0–12.0)
Neutro Abs: 2.8 10*3/uL (ref 1.4–7.7)
Neutrophils Relative %: 59 % (ref 43.0–77.0)
Platelets: 148 10*3/uL — ABNORMAL LOW (ref 150.0–400.0)
RBC: 3.18 Mil/uL — ABNORMAL LOW (ref 4.22–5.81)
RDW: 15.5 % (ref 11.5–15.5)
WBC: 4.8 10*3/uL (ref 4.0–10.5)

## 2020-09-13 LAB — COMPREHENSIVE METABOLIC PANEL
ALT: 6 U/L (ref 0–53)
AST: 14 U/L (ref 0–37)
Albumin: 3.8 g/dL (ref 3.5–5.2)
Alkaline Phosphatase: 70 U/L (ref 39–117)
BUN: 41 mg/dL — ABNORMAL HIGH (ref 6–23)
CO2: 26 mEq/L (ref 19–32)
Calcium: 9.1 mg/dL (ref 8.4–10.5)
Chloride: 99 mEq/L (ref 96–112)
Creatinine, Ser: 2.28 mg/dL — ABNORMAL HIGH (ref 0.40–1.50)
GFR: 25.5 mL/min — ABNORMAL LOW (ref >60.00)
Glucose, Bld: 120 mg/dL — ABNORMAL HIGH (ref 70–99)
Potassium: 4 mEq/L (ref 3.5–5.1)
Sodium: 135 mEq/L (ref 135–145)
Total Bilirubin: 0.4 mg/dL (ref 0.2–1.2)
Total Protein: 7.1 g/dL (ref 6.0–8.3)

## 2020-09-13 NOTE — Telephone Encounter (Signed)
Spoke with [name], she is aware of Dr. Erick Blinks message. She would liek to know how long he should stay off the lasix.

## 2020-09-13 NOTE — Telephone Encounter (Signed)
Spoke with [employer]. They stated we can write a paper prescription and give it to the patient. They stated this just needs to be written for the brace and that they can fit him when he brings in the prescription.   Please advise.

## 2020-09-13 NOTE — Telephone Encounter (Signed)
Prescription pad placed on your desk.

## 2020-09-13 NOTE — Telephone Encounter (Signed)
Patient daughter stated that the patient has been taking 60 mg of furosemide (LASIX)  after Dr.[name] increased it but Dr.[name] kept saying 40 mg during the visit yesterday. She just wanted to note it in the chart.  Please advise.

## 2020-09-13 NOTE — Telephone Encounter (Signed)
Noted. Working on getting a prescription pad.

## 2020-09-14 ENCOUNTER — Inpatient Hospital Stay: Payer: Medicare Other

## 2020-09-14 ENCOUNTER — Telehealth: Payer: Medicare Other | Admitting: Family Medicine

## 2020-09-14 ENCOUNTER — Telehealth: Payer: Self-pay | Admitting: *Deleted

## 2020-09-14 ENCOUNTER — Other Ambulatory Visit: Payer: Self-pay

## 2020-09-14 ENCOUNTER — Inpatient Hospital Stay: Payer: Medicare Other | Admitting: Hematology & Oncology

## 2020-09-14 ENCOUNTER — Encounter: Payer: Self-pay | Admitting: Hematology & Oncology

## 2020-09-14 ENCOUNTER — Inpatient Hospital Stay: Payer: Medicare Other | Attending: Hematology & Oncology

## 2020-09-14 VITALS — BP 120/78 | HR 74 | Temp 98.1°F | Resp 17 | Ht 70.0 in

## 2020-09-14 DIAGNOSIS — R519 Headache, unspecified: Secondary | ICD-10-CM | POA: Insufficient documentation

## 2020-09-14 DIAGNOSIS — D631 Anemia in chronic kidney disease: Secondary | ICD-10-CM

## 2020-09-14 DIAGNOSIS — C8589 Other specified types of non-Hodgkin lymphoma, extranodal and solid organ sites: Secondary | ICD-10-CM | POA: Insufficient documentation

## 2020-09-14 DIAGNOSIS — N183 Chronic kidney disease, stage 3 unspecified: Secondary | ICD-10-CM | POA: Insufficient documentation

## 2020-09-14 DIAGNOSIS — N390 Urinary tract infection, site not specified: Secondary | ICD-10-CM

## 2020-09-14 DIAGNOSIS — Z95828 Presence of other vascular implants and grafts: Secondary | ICD-10-CM

## 2020-09-14 DIAGNOSIS — Z9221 Personal history of antineoplastic chemotherapy: Secondary | ICD-10-CM | POA: Insufficient documentation

## 2020-09-14 DIAGNOSIS — Z7901 Long term (current) use of anticoagulants: Secondary | ICD-10-CM | POA: Insufficient documentation

## 2020-09-14 DIAGNOSIS — R110 Nausea: Secondary | ICD-10-CM | POA: Diagnosis not present

## 2020-09-14 DIAGNOSIS — N184 Chronic kidney disease, stage 4 (severe): Secondary | ICD-10-CM

## 2020-09-14 DIAGNOSIS — D500 Iron deficiency anemia secondary to blood loss (chronic): Secondary | ICD-10-CM

## 2020-09-14 LAB — CMP (CANCER CENTER ONLY)
ALT: 5 U/L (ref 0–44)
AST: 14 U/L — ABNORMAL LOW (ref 15–41)
Albumin: 4 g/dL (ref 3.5–5.0)
Alkaline Phosphatase: 73 U/L (ref 38–126)
Anion gap: 9 (ref 5–15)
BUN: 41 mg/dL — ABNORMAL HIGH (ref 8–23)
CO2: 27 mmol/L (ref 22–32)
Calcium: 9.7 mg/dL (ref 8.9–10.3)
Chloride: 101 mmol/L (ref 98–111)
Creatinine: 1.98 mg/dL — ABNORMAL HIGH (ref 0.61–1.24)
GFR, Estimated: 32 mL/min — ABNORMAL LOW (ref >60)
Glucose, Bld: 134 mg/dL — ABNORMAL HIGH (ref 70–99)
Potassium: 3.9 mmol/L (ref 3.5–5.1)
Sodium: 137 mmol/L (ref 135–145)
Total Bilirubin: 0.4 mg/dL (ref 0.3–1.2)
Total Protein: 6.9 g/dL (ref 6.5–8.1)

## 2020-09-14 LAB — URINALYSIS, COMPLETE (UACMP) WITH MICROSCOPIC
Bilirubin Urine: NEGATIVE
Glucose, UA: NEGATIVE mg/dL
Ketones, ur: NEGATIVE mg/dL
Nitrite: POSITIVE — AB
Protein, ur: >300 mg/dL — AB
RBC / HPF: >50 RBC/hpf (ref 0–5)
Specific Gravity, Urine: 1.005 (ref 1.005–1.030)
WBC, UA: >50 WBC/hpf (ref 0–5)
pH: 8.5 — ABNORMAL HIGH (ref 5.0–8.0)

## 2020-09-14 LAB — CBC WITH DIFFERENTIAL (CANCER CENTER ONLY)
Abs Immature Granulocytes: 0.09 10*3/uL — ABNORMAL HIGH (ref 0.00–0.07)
Basophils Absolute: 0.1 10*3/uL (ref 0.0–0.1)
Basophils Relative: 1 %
Eosinophils Absolute: 0.1 10*3/uL (ref 0.0–0.5)
Eosinophils Relative: 2 %
HCT: 31.5 % — ABNORMAL LOW (ref 39.0–52.0)
Hemoglobin: 10.3 g/dL — ABNORMAL LOW (ref 13.0–17.0)
Immature Granulocytes: 1 %
Lymphocytes Relative: 23 %
Lymphs Abs: 1.6 10*3/uL (ref 0.7–4.0)
MCH: 31.3 pg (ref 26.0–34.0)
MCHC: 32.7 g/dL (ref 30.0–36.0)
MCV: 95.7 fL (ref 80.0–100.0)
Monocytes Absolute: 0.4 10*3/uL (ref 0.1–1.0)
Monocytes Relative: 6 %
Neutro Abs: 4.7 10*3/uL (ref 1.7–7.7)
Neutrophils Relative %: 67 %
Platelet Count: 155 10*3/uL (ref 150–400)
RBC: 3.29 MIL/uL — ABNORMAL LOW (ref 4.22–5.81)
RDW: 14.7 % (ref 11.5–15.5)
WBC Count: 7 10*3/uL (ref 4.0–10.5)
nRBC: 0 % (ref 0.0–0.2)

## 2020-09-14 MED ORDER — SODIUM CHLORIDE 0.9 % IV SOLN: INTRAVENOUS | Status: DC

## 2020-09-14 MED ORDER — CIPROFLOXACIN HCL 500 MG PO TABS: 500.0000 mg | ORAL_TABLET | Freq: Every day | ORAL | 0 refills | Status: DC

## 2020-09-14 MED ORDER — EPOETIN ALFA-EPBX 40000 UNIT/ML IJ SOLN
40000.0000 [IU] | Freq: Once | INTRAMUSCULAR | Status: AC
  Administered 2020-09-14: 40000 [IU] via SUBCUTANEOUS
  Filled 2020-09-14: qty 1

## 2020-09-14 MED ORDER — SODIUM CHLORIDE 0.9% FLUSH
10.0000 mL | Freq: Once | INTRAVENOUS | Status: AC | PRN
  Administered 2020-09-14: 10 mL

## 2020-09-14 MED ORDER — KETOROLAC TROMETHAMINE 15 MG/ML IJ SOLN
15.0000 mg | Freq: Once | INTRAMUSCULAR | Status: AC
  Administered 2020-09-14: 15 mg via INTRAVENOUS

## 2020-09-14 MED ORDER — DEXTROSE 5 % IV SOLN
2.0000 g | Freq: Once | INTRAVENOUS | Status: AC
  Administered 2020-09-14: 2 g via INTRAVENOUS
  Filled 2020-09-14: qty 20

## 2020-09-14 MED ORDER — HEPARIN SOD (PORK) LOCK FLUSH 100 UNIT/ML IV SOLN
500.0000 [IU] | Freq: Once | INTRAVENOUS | Status: AC | PRN
  Administered 2020-09-14: 500 [IU]

## 2020-09-14 NOTE — Patient Instructions (Signed)
Implanted Port Home Guide An implanted port is a device that is placed under the skin. It is usually placed in the chest. The device can be used to give IV medicine, to take blood, or for dialysis. You may have an implanted port if: You need IV medicine that would be irritating to the small veins in your hands or arms. You need IV medicines, such as antibiotics, for a long period of time. You need IV nutrition for a long period of time. You need dialysis. When you have a port, your health care provider can choose to use the port instead of veins in your arms for these procedures. You may have fewer limitations when using a port than you would if you used other types of long-term IVs, and you will likely be able to return to normal activities after your incision heals. An implanted port has two main parts: Reservoir. The reservoir is the part where a needle is inserted to give medicines or draw blood. The reservoir is round. After it is placed, it appears as a small, raised area under your skin. Catheter. The catheter is a thin, flexible tube that connects the reservoir to a vein. Medicine that is inserted into the reservoir goes into the catheter and then into the vein. How is my port accessed? To access your port: A numbing cream may be placed on the skin over the port site. Your health care provider will put on a mask and sterile gloves. The skin over your port will be cleaned carefully with a germ-killing soap and allowed to dry. Your health care provider will gently pinch the port and insert a needle into it. Your health care provider will check for a blood return to make sure the port is in the vein and is not clogged. If your port needs to remain accessed to get medicine continuously (constant infusion), your health care provider will place a clear bandage (dressing) over the needle site. The dressing and needle will need to be changed every week, or as told by your health care provider. What  is flushing? Flushing helps keep the port from getting clogged. Follow instructions from your health care provider about how and when to flush the port. Ports are usually flushed with saline solution or a medicine called heparin. The need for flushing will depend on how the port is used: If the port is only used from time to time to give medicines or draw blood, the port may need to be flushed: Before and after medicines have been given. Before and after blood has been drawn. As part of routine maintenance. Flushing may be recommended every 4-6 weeks. If a constant infusion is running, the port may not need to be flushed. Throw away any syringes in a disposal container that is meant for sharp items (sharps container). You can buy a sharps container from a pharmacy, or you can make one by using an empty hard plastic bottle with a cover. How long will my port stay implanted? The port can stay in for as long as your health care provider thinks it is needed. When it is time for the port to come out, a surgery will be done to remove it. The surgery will be similar to the procedure that was done to put the port in. Follow these instructions at home:  Flush your port as told by your health care provider. If you need an infusion over several days, follow instructions from your health care provider about how   to take care of your port site. Make sure you: Wash your hands with soap and water before you change your dressing. If soap and water are not available, use alcohol-based hand sanitizer. Change your dressing as told by your health care provider. Place any used dressings or infusion bags into a plastic bag. Throw that bag in the trash. Keep the dressing that covers the needle clean and dry. Do not get it wet. Do not use scissors or sharp objects near the tube. Keep the tube clamped, unless it is being used. Check your port site every day for signs of infection. Check for: Redness, swelling, or  pain. Fluid or blood. Pus or a bad smell. Protect the skin around the port site. Avoid wearing bra straps that rub or irritate the site. Protect the skin around your port from seat belts. Place a soft pad over your chest if needed. Bathe or shower as told by your health care provider. The site may get wet as long as you are not actively receiving an infusion. Return to your normal activities as told by your health care provider. Ask your health care provider what activities are safe for you. Carry a medical alert card or wear a medical alert bracelet at all times. This will let health care providers know that you have an implanted port in case of an emergency. Get help right away if: You have redness, swelling, or pain at the port site. You have fluid or blood coming from your port site. You have pus or a bad smell coming from the port site. You have a fever. Summary Implanted ports are usually placed in the chest for long-term IV access. Follow instructions from your health care provider about flushing the port and changing bandages (dressings). Take care of the area around your port by avoiding clothing that puts pressure on the area, and by watching for signs of infection. Protect the skin around your port from seat belts. Place a soft pad over your chest if needed. Get help right away if you have a fever or you have redness, swelling, pain, drainage, or a bad smell at the port site. This information is not intended to replace advice given to you by your health care provider. Make sure you discuss any questions you have with your health care provider. Document Revised: 03/15/2020 Document Reviewed: 05/10/2019 Elsevier Patient Education  2022 [employer]  

## 2020-09-14 NOTE — Progress Notes (Signed)
Hematology and Oncology Follow Up Visit  Gary Yates [id number] 1933/01/15 87 y.o. 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  He has not been feeling well at all.  He says he never felt well for about a month.  Over the past week or so he has been getting a little bit worse.  He saw his family doctor earlier this week.  He has some lab work done.  I saw him today, it looks like he lost quite a bit of weight.  He is not eating.  He is nauseated.Marland Kitchen  He has a indwelling Foley catheter.  I will get his urine in the urine bag it, it looked quite cloudy.  We took a specimen.  I suspect he probably has a UTI.  He does have multiple health issues.  He does have cardiac issues.  I do not think this is anything related to lymphoma coming back.  He has not been in a while for his Retacrit.  We will go ahead and give him a Retacrit injection.  I would like to go ahead and give him a dose of Rocephin in the office.  Again I believe that he is going to have a urinary tract infection.  Hopefully this is what is causing to feel so poorly.  He has had no cough.  He has a headache.  He is on Coumadin.  I think he said his last INR was 1.6.  I believe that cardiology is managing this.  Currently, I would say his performance status is probably ECOG 2-3.  Medications:  Allergies as of 09/14/2020       Reactions   Latex Other (See Comments)   Reddens the skin   Ace  Inhibitors Cough   Codeine Nausea Only, Rash      Penicillins Rash   Childhood allergy Has patient had a PCN reaction causing immediate rash, facial/tongue/throat swelling, SOB or lightheadedness with hypotension: Yes Has patient had a PCN reaction causing severe rash involving mucus membranes or skin necrosis: Yes Has patient had a PCN reaction that required hospitalization No Has patient had a PCN reaction occurring within the last 10 years: No If all of the above answers are "NO", then may proceed with Cephalosporin use.        Medication List        Accurate as of September 14, 2020  1:47 PM. If you have any questions, ask your nurse or doctor.          Accu-Chek Aviva Plus test strip Generic drug: glucose blood Test 2 times a day.   acetaminophen 500 MG tablet Commonly known as: TYLENOL Take 1,000 mg by mouth every 6 (six) hours as needed (for pain/fever/headaches.).   atorvastatin 40 MG tablet Commonly known as: LIPITOR TAKE 1 TABLET BY MOUTH EVERY DAY   chlorhexidine 0.12 % solution Commonly known as: PERIDEX Use as directed 15 mLs in the mouth  or throat 2 (two) times daily.   docusate sodium 50 MG capsule Commonly known as: COLACE Take 1 capsule (50 mg total) by mouth 2 (two) times daily as needed (for constipation).   escitalopram 10 MG tablet Commonly known as: LEXAPRO TAKE ONE HALF TABLET ONCE DAILY FOR ONE WEEK AND THEN INCREASE TO ONE DAILY.   furosemide 40 MG tablet Commonly known as: LASIX TAKE 1 TABLET BY MOUTH EVERY DAY   gabapentin 300 MG capsule Commonly known as: NEURONTIN TAKE 2 CAPSULES BY MOUTH 3 TIMES A DAY What changed:  how much to take how to take this when to take this additional instructions   ibuprofen 200 MG tablet Commonly known as: ADVIL Take 600 mg by mouth every 3 (three) hours as needed for moderate pain.   levothyroxine 25 MCG tablet Commonly known as: SYNTHROID TAKE 1 TABLET BY MOUTH EVERY DAY   metFORMIN 500 MG  tablet Commonly known as: GLUCOPHAGE Take 1 tablet (500 mg total) by mouth 2 (two) times daily with a meal.   metoprolol succinate 25 MG 24 hr tablet Commonly known as: TOPROL-XL TAKE 1 TABLET BY MOUTH EVERY DAY   nitroGLYCERIN 0.4 MG SL tablet Commonly known as: NITROSTAT Place 1 tablet (0.4 mg total) under the tongue every 5 (five) minutes as needed. Chest pain   pantoprazole 40 MG tablet Commonly known as: PROTONIX TAKE 1 TABLET BY MOUTH EVERY DAY   polyethylene glycol 17 g packet Commonly known as: MIRALAX / GLYCOLAX Take 17 g by mouth daily as needed for mild constipation.   potassium chloride SA 20 MEQ tablet Commonly known as: Klor-Con M20 Take 1 tablet (20 mEq total) by mouth daily.   pramipexole 1.5 MG tablet Commonly known as: MIRAPEX TAKE 2 TABLETS BY MOUTH EVERY EVENING   warfarin 5 MG tablet Commonly known as: COUMADIN Take as directed by the anticoagulation clinic. If you are unsure how to take this medication, talk to your nurse or doctor. Original instructions: Take 1 tablet daily or Take as directed by anticoagulation clinic        Allergies:  Allergies  Allergen Reactions   Latex Other (See Comments)    Reddens the skin   Ace Inhibitors Cough   Codeine Nausea Only and Rash        Penicillins Rash    Childhood allergy Has patient had a PCN reaction causing immediate rash, facial/tongue/throat swelling, SOB or lightheadedness with hypotension: Yes Has patient had a PCN reaction causing severe rash involving mucus membranes or skin necrosis: Yes Has patient had a PCN reaction that required hospitalization No Has patient had a PCN reaction occurring within the last 10 years: No If all of the above answers are "NO", then may proceed with Cephalosporin use.     Past Medical History, Surgical history, Social history, and Family History were reviewed and updated.  Review of Systems: Review of Systems  Constitutional:  Positive for malaise/fatigue  and weight loss.  HENT: Negative.    Eyes: Negative.   Respiratory: Negative.    Cardiovascular:  Positive for palpitations.  Gastrointestinal:  Positive for nausea.  Genitourinary: Negative.   Musculoskeletal:  Positive for back pain.  Skin: Negative.   Neurological:  Positive for headaches.  Endo/Heme/Allergies: Negative.     Physical Exam:  height is 5\' 10"  (1.778 m). His oral temperature is 98.1 F (36.7 C). His blood pressure is 120/78 and his pulse is 74. His respiration is 17 and oxygen saturation is 98%.   Wt  Readings from Last 3 Encounters:  09/12/20 198 lb 14.4 oz (90.2 kg)  08/30/20 201 lb 11.2 oz (91.5 kg)  08/02/20 200 lb 1.6 oz (90.8 kg)    Physical Exam Vitals reviewed.  HENT:     Head: Normocephalic and atraumatic.  Eyes:     Pupils: Pupils are equal, round, and reactive to light.  Cardiovascular:     Rate and Rhythm: Normal rate and regular rhythm.     Heart sounds: Normal heart sounds.  Pulmonary:     Effort: Pulmonary effort is normal.     Breath sounds: Normal breath sounds.  Abdominal:     General: Bowel sounds are normal.     Palpations: Abdomen is soft.  Musculoskeletal:        General: No tenderness or deformity. Normal range of motion.     Cervical back: Normal range of motion.  Lymphadenopathy:     Cervical: No cervical adenopathy.  Skin:    General: Skin is warm and dry.     Findings: No erythema or rash.  Neurological:     Mental Status: He is alert and oriented to person, place, and time.  Psychiatric:        Behavior: Behavior normal.        Thought Content: Thought content normal.        Judgment: Judgment normal.    Lab Results  Component Value Date   WBC 7.0 09/14/2020   HGB 10.3 (L) 09/14/2020   HCT 31.5 (L) 09/14/2020   MCV 95.7 09/14/2020   PLT 155 09/14/2020   Lab Results  Component Value Date   FERRITIN 395 (H) 06/26/2020   IRON 70 06/26/2020   TIBC 270 06/26/2020   UIBC 200 06/26/2020   IRONPCTSAT 26 06/26/2020    Lab Results  Component Value Date   RETICCTPCT 0.8 05/18/2020   RBC 3.29 (L) 09/14/2020   No results found for: KPAFRELGTCHN, LAMBDASER, KAPLAMBRATIO No results found for: IGGSERUM, IGA, IGMSERUM No results found for: Kathrynn Ducking, MSPIKE, SPEI   Chemistry      Component Value Date/Time   NA 137 09/14/2020 1205   NA 137 08/18/2019 0000   NA 141 01/10/2017 1136   NA 137 12/14/2015 1100   K 3.9 09/14/2020 1205   K 4.4 01/10/2017 1136   K 3.7 12/14/2015 1100   CL 101 09/14/2020 1205   CL 104 01/10/2017 1136   CO2 27 09/14/2020 1205   CO2 27 01/10/2017 1136   CO2 24 12/14/2015 1100   BUN 41 (H) 09/14/2020 1205   BUN 29 (A) 08/18/2019 0000   BUN 19 01/10/2017 1136   BUN 18.4 12/14/2015 1100   CREATININE 1.98 (H) 09/14/2020 1205   CREATININE 1.76 (H) 10/08/2019 0100   CREATININE 0.9 12/14/2015 1100   GLU 122 08/18/2019 0000      Component Value Date/Time   CALCIUM 9.7 09/14/2020 1205   CALCIUM 9.4 01/10/2017 1136   CALCIUM 9.5 12/14/2015 1100   ALKPHOS 73 09/14/2020 1205   ALKPHOS 112 (H) 01/10/2017 1136   ALKPHOS 102 12/14/2015 1100   AST 14 (L) 09/14/2020 1205   AST 22 12/14/2015 1100   ALT 5 09/14/2020 1205   ALT 18 01/10/2017 1136   ALT 10 12/14/2015 1100   BILITOT 0.4 09/14/2020 1205   BILITOT 0.58 12/14/2015 1100       Impression and Plan: Gary Yates is an 87 yo caucasian male with history of relapsed large cell non-Hodgkin lymphoma  of the testicle. He received salvage chemotherapy with Rituxan/bendamustine/Velcade.  He did not really respond well to this.  We then treated him with dose reduced R-ICE.  He responded very well to this.  His last PET scan in November 2019 showed that he was in remission.  For right now, we will have to see how he does with the Rocephin.  I will give him a dose of Toradol to try to help with a headache.  I will call in some ciprofloxacin for him.  I think this would be reasonable.    Hopefully, this will make him feel better.  It will be interesting to see what the urine culture has to show.  We will keep his regular appointment in late September.  Hopefully, he will not end up in the hospital.  I know that he has had problems in the past with urine infections.   Volanda Napoleon, MD 9/8/20221:47 PM

## 2020-09-14 NOTE — Patient Instructions (Signed)

## 2020-09-14 NOTE — Telephone Encounter (Signed)
Call received from patient's son [name] to inform Dr. Marin Olp that pt has been weak and unsteady on his feet and would like to know if a Retacrit injection is needed.  Dr. Marin Olp notified.  [name] notified per order of Dr. Marin Olp to bring pt in today for labs, to see Dr Marin Olp and possible injection.  Scheduling notified.

## 2020-09-14 NOTE — Patient Instructions (Signed)
Epoetin Alfa injection What is this medication? EPOETIN ALFA (e POE e tin AL fa) helps your body make more red blood cells. This medicine is used to treat anemia caused by chronic kidney disease, cancer chemotherapy, or HIV-therapy. It may also be used before surgery if you have anemia. This medicine may be used for other purposes; ask your health care provider or pharmacist if you have questions. COMMON BRAND NAME(S): Epogen, Procrit, Retacrit What should I tell my care team before I take this medication? They need to know if you have any of these conditions: cancer heart disease high blood pressure history of blood clots history of stroke low levels of folate, iron, or vitamin B12 in the blood seizures an unusual or allergic reaction to erythropoietin, albumin, benzyl alcohol, hamster proteins, other medicines, foods, dyes, or preservatives pregnant or trying to get pregnant breast-feeding How should I use this medication? This medicine is for injection into a vein or under the skin. It is usually given by a health care professional in a hospital or clinic setting. If you get this medicine at home, you will be taught how to prepare and give this medicine. Use exactly as directed. Take your medicine at regular intervals. Do not take your medicine more often than directed. It is important that you put your used needles and syringes in a special sharps container. Do not put them in a trash can. If you do not have a sharps container, call your pharmacist or healthcare provider to get one. A special MedGuide will be given to you by the pharmacist with each prescription and refill. Be sure to read this information carefully each time. Talk to your pediatrician regarding the use of this medicine in children. While this drug may be prescribed for selected conditions, precautions do apply. Overdosage: If you think you have taken too much of this medicine contact a poison control center or emergency  room at once. NOTE: This medicine is only for you. Do not share this medicine with others. What if I miss a dose? If you miss a dose, take it as soon as you can. If it is almost time for your next dose, take only that dose. Do not take double or extra doses. What may interact with this medication? Interactions have not been studied. This list may not describe all possible interactions. Give your health care provider a list of all the medicines, herbs, non-prescription drugs, or dietary supplements you use. Also tell them if you smoke, drink alcohol, or use illegal drugs. Some items may interact with your medicine. What should I watch for while using this medication? Your condition will be monitored carefully while you are receiving this medicine. You may need blood work done while you are taking this medicine. This medicine may cause a decrease in vitamin B6. You should make sure that you get enough vitamin B6 while you are taking this medicine. Discuss the foods you eat and the vitamins you take with your health care professional. What side effects may I notice from receiving this medication? Side effects that you should report to your doctor or health care professional as soon as possible: allergic reactions like skin rash, itching or hives, swelling of the face, lips, or tongue seizures signs and symptoms of a blood clot such as breathing problems; changes in vision; chest pain; severe, sudden headache; pain, swelling, warmth in the leg; trouble speaking; sudden numbness or weakness of the face, arm or leg signs and symptoms of a stroke like   changes in vision; confusion; trouble speaking or understanding; severe headaches; sudden numbness or weakness of the face, arm or leg; trouble walking; dizziness; loss of balance or coordination Side effects that usually do not require medical attention (report to your doctor or health care professional if they continue or are  bothersome): chills cough dizziness fever headaches joint pain muscle cramps muscle pain nausea, vomiting pain, redness, or irritation at site where injected This list may not describe all possible side effects. Call your doctor for medical advice about side effects. You may report side effects to FDA at 1-800-FDA-1088. Where should I keep my medication? Keep out of the reach of children. Store in a refrigerator between 2 and 8 degrees C (36 and 46 degrees F). Do not freeze or shake. Throw away any unused portion if using a single-dose vial. Multi-dose vials can be kept in the refrigerator for up to 21 days after the initial dose. Throw away unused medicine. NOTE: This sheet is a summary. It may not cover all possible information. If you have questions about this medicine, talk to your doctor, pharmacist, or health care provider.  2022 Elsevier/Gold Standard (2016-08-02 08:35:19)  

## 2020-09-14 NOTE — Telephone Encounter (Signed)
Spoke with [name]. She is aware of Dr. Erick Blinks message.

## 2020-09-14 NOTE — Progress Notes (Signed)
Reviewed with MD. Proceed with Rocepin today with PCN allergy.  Raul Del [place], [place], BCPS, BCOP 09/14/2020 1:31 PM

## 2020-09-15 ENCOUNTER — Telehealth: Payer: Self-pay

## 2020-09-15 ENCOUNTER — Telehealth: Payer: Self-pay | Admitting: Hematology & Oncology

## 2020-09-15 LAB — URINE CULTURE

## 2020-09-15 LAB — IRON AND TIBC
Iron: 76 ug/dL (ref 42–163)
Saturation Ratios: 31 % (ref 20–55)
TIBC: 249 ug/dL (ref 202–409)
UIBC: 173 ug/dL (ref 117–376)

## 2020-09-15 LAB — FERRITIN: Ferritin: 364 ng/mL — ABNORMAL HIGH (ref 24–336)

## 2020-09-15 NOTE — Telephone Encounter (Signed)
No los 9/8

## 2020-09-15 NOTE — Telephone Encounter (Signed)
Daughter of patient called to ask why a brace for the patient's foot was ordered.  Call back # [phone]

## 2020-09-15 NOTE — Telephone Encounter (Signed)
Spoke patients daughter. She is aware the brace was recommended by PT due to weakness in the right foot that continues to cause the patient to fall. Nothing further needed.

## 2020-09-15 NOTE — Telephone Encounter (Signed)
Spoke with Gary Yates, she is aware that the Rx for the brace is ready and will pick it up.

## 2020-09-17 ENCOUNTER — Other Ambulatory Visit: Payer: Self-pay | Admitting: Family Medicine

## 2020-09-19 ENCOUNTER — Other Ambulatory Visit: Payer: Self-pay | Admitting: Hematology & Oncology

## 2020-09-19 ENCOUNTER — Telehealth: Payer: Self-pay

## 2020-09-19 DIAGNOSIS — C8589 Other specified types of non-Hodgkin lymphoma, extranodal and solid organ sites: Secondary | ICD-10-CM

## 2020-09-19 NOTE — Telephone Encounter (Signed)
Received call from pt's daughter Lattie Haw stating that pt has become more nauseated and has been dry heaving since beginning Cipro. Lattie Haw questions if Dr Marin Olp would recommend switching to a different antibiotic.   Per Dr Marin Olp, pt can stop Cipro. MD is concerned that the nausea may be more related to the UTI than medication based on how pt's urine appeared last week. Dr Marin Olp suggested contacting the MD who manages pt's indwelling catheter as he feels like this doctor needs to know what is going on. Dr Marin Olp will also order a CT scan of CAP to investigate if there is another potential cause for pt's recent change in overall performance. Lisa aware and verbalizes understanding to contact urology using [employer]. dph

## 2020-09-20 ENCOUNTER — Telehealth: Payer: Self-pay

## 2020-09-20 ENCOUNTER — Telehealth: Payer: Self-pay | Admitting: Family Medicine

## 2020-09-20 MED ORDER — ONDANSETRON HCL 4 MG PO TABS: ORAL_TABLET | ORAL | 0 refills | Status: DC

## 2020-09-20 NOTE — Telephone Encounter (Signed)
Patient has been experiencing some nausea and has not felt well for about a week. Vomiting is a new symptom that has started over the past few days.

## 2020-09-20 NOTE — Telephone Encounter (Signed)
Daughter of patient called asking for assistance in locating [facility]  to assist with the rx for foot brace. Call back # [phone]

## 2020-09-20 NOTE — Telephone Encounter (Signed)
Please advise 

## 2020-09-20 NOTE — Addendum Note (Signed)
Addended by: Rebecca Eaton on: 09/20/2020 01:24 PM   Modules accepted: Orders

## 2020-09-20 NOTE — Telephone Encounter (Signed)
PT daughter called to advise that the PT is still not feeling and they are now throwing up. They would like to see what can be done to help the PT or prescribed.

## 2020-09-20 NOTE — Telephone Encounter (Signed)
Spoke with the patients daughter. She was aware of the location on [address] but wanted to know if there were any other locations. I informed her there are also [facility] locations in [place] and [place]. The information for the [place] location was given per pt request. Nothing further needed.

## 2020-09-20 NOTE — Telephone Encounter (Signed)
Per Dr. Elease Hashimoto, "Make sure they are still holding the Lasix.  He just saw oncologist and had question of UTI.  He was given Rocephin.  Urine culture though showed mixed species.  See if he has had any fever.  If they have nothing for nausea send in Zofran 4 mg ODT 1 every 8 hours as needed for nausea.  If having recurrent nausea and vomiting may need ER assessment and possible IV fluids."  Spoke with the patient's daughter, Gary Yates. She is aware of Dr. Erick Blinks message.  No fever. Patient doe not have any medication for nausea. Rx sent in.

## 2020-09-22 ENCOUNTER — Other Ambulatory Visit: Payer: Self-pay | Admitting: Family Medicine

## 2020-09-24 ENCOUNTER — Other Ambulatory Visit: Payer: Self-pay

## 2020-09-24 ENCOUNTER — Emergency Department (HOSPITAL_BASED_OUTPATIENT_CLINIC_OR_DEPARTMENT_OTHER)
Admission: EM | Admit: 2020-09-24 | Discharge: 2020-09-24 | Disposition: A | Discharge status: Home / Self Care | Payer: Medicare Other | Attending: Emergency Medicine | Admitting: Emergency Medicine

## 2020-09-24 ENCOUNTER — Emergency Department (HOSPITAL_BASED_OUTPATIENT_CLINIC_OR_DEPARTMENT_OTHER): Payer: Medicare Other

## 2020-09-24 ENCOUNTER — Encounter (HOSPITAL_BASED_OUTPATIENT_CLINIC_OR_DEPARTMENT_OTHER): Payer: Self-pay | Admitting: Emergency Medicine

## 2020-09-24 DIAGNOSIS — R112 Nausea with vomiting, unspecified: Secondary | ICD-10-CM | POA: Diagnosis present

## 2020-09-24 DIAGNOSIS — E1122 Type 2 diabetes mellitus with diabetic chronic kidney disease: Secondary | ICD-10-CM | POA: Diagnosis not present

## 2020-09-24 DIAGNOSIS — Z91040 Latex allergy status: Secondary | ICD-10-CM | POA: Diagnosis not present

## 2020-09-24 DIAGNOSIS — J449 Chronic obstructive pulmonary disease, unspecified: Secondary | ICD-10-CM | POA: Insufficient documentation

## 2020-09-24 DIAGNOSIS — I129 Hypertensive chronic kidney disease with stage 1 through stage 4 chronic kidney disease, or unspecified chronic kidney disease: Secondary | ICD-10-CM | POA: Insufficient documentation

## 2020-09-24 DIAGNOSIS — Z7902 Long term (current) use of antithrombotics/antiplatelets: Secondary | ICD-10-CM | POA: Diagnosis not present

## 2020-09-24 DIAGNOSIS — Z79899 Other long term (current) drug therapy: Secondary | ICD-10-CM | POA: Diagnosis not present

## 2020-09-24 DIAGNOSIS — I480 Paroxysmal atrial fibrillation: Secondary | ICD-10-CM | POA: Diagnosis not present

## 2020-09-24 DIAGNOSIS — Z87891 Personal history of nicotine dependence: Secondary | ICD-10-CM | POA: Diagnosis not present

## 2020-09-24 DIAGNOSIS — N183 Chronic kidney disease, stage 3 unspecified: Secondary | ICD-10-CM | POA: Diagnosis not present

## 2020-09-24 DIAGNOSIS — R519 Headache, unspecified: Secondary | ICD-10-CM | POA: Insufficient documentation

## 2020-09-24 LAB — CBC WITH DIFFERENTIAL/PLATELET
Abs Immature Granulocytes: 0.01 10*3/uL (ref 0.00–0.07)
Basophils Absolute: 0.1 10*3/uL (ref 0.0–0.1)
Basophils Relative: 1 %
Eosinophils Absolute: 0.1 10*3/uL (ref 0.0–0.5)
Eosinophils Relative: 2 %
HCT: 34.9 % — ABNORMAL LOW (ref 39.0–52.0)
Hemoglobin: 11.5 g/dL — ABNORMAL LOW (ref 13.0–17.0)
Immature Granulocytes: 0 %
Lymphocytes Relative: 17 %
Lymphs Abs: 0.8 10*3/uL (ref 0.7–4.0)
MCH: 31.4 pg (ref 26.0–34.0)
MCHC: 33 g/dL (ref 30.0–36.0)
MCV: 95.4 fL (ref 80.0–100.0)
Monocytes Absolute: 0.3 10*3/uL (ref 0.1–1.0)
Monocytes Relative: 6 %
Neutro Abs: 3.6 10*3/uL (ref 1.7–7.7)
Neutrophils Relative %: 74 %
Platelets: 122 10*3/uL — ABNORMAL LOW (ref 150–400)
RBC: 3.66 MIL/uL — ABNORMAL LOW (ref 4.22–5.81)
RDW: 16.3 % — ABNORMAL HIGH (ref 11.5–15.5)
WBC: 4.9 10*3/uL (ref 4.0–10.5)
nRBC: 0 % (ref 0.0–0.2)

## 2020-09-24 LAB — BASIC METABOLIC PANEL
Anion gap: 10 (ref 5–15)
BUN: 25 mg/dL — ABNORMAL HIGH (ref 8–23)
CO2: 22 mmol/L (ref 22–32)
Calcium: 9.6 mg/dL (ref 8.9–10.3)
Chloride: 105 mmol/L (ref 98–111)
Creatinine, Ser: 2.19 mg/dL — ABNORMAL HIGH (ref 0.61–1.24)
GFR, Estimated: 29 mL/min — ABNORMAL LOW (ref >60)
Glucose, Bld: 110 mg/dL — ABNORMAL HIGH (ref 70–99)
Potassium: 4.4 mmol/L (ref 3.5–5.1)
Sodium: 137 mmol/L (ref 135–145)

## 2020-09-24 MED ORDER — LORAZEPAM 1 MG PO TABS
0.5000 mg | ORAL_TABLET | Freq: Once | ORAL | Status: AC
  Administered 2020-09-24: 0.5 mg via ORAL
  Filled 2020-09-24: qty 1

## 2020-09-24 MED ORDER — ACETAMINOPHEN 325 MG PO TABS
650.0000 mg | ORAL_TABLET | Freq: Once | ORAL | Status: AC
  Administered 2020-09-24: 650 mg via ORAL
  Filled 2020-09-24: qty 2

## 2020-09-24 MED ORDER — PROCHLORPERAZINE EDISYLATE 10 MG/2ML IJ SOLN
10.0000 mg | Freq: Once | INTRAMUSCULAR | Status: AC
  Administered 2020-09-24: 10 mg via INTRAVENOUS
  Filled 2020-09-24: qty 2

## 2020-09-24 MED ORDER — ONDANSETRON HCL 4 MG/2ML IJ SOLN
4.0000 mg | Freq: Once | INTRAMUSCULAR | Status: AC
  Administered 2020-09-24: 4 mg via INTRAVENOUS
  Filled 2020-09-24: qty 2

## 2020-09-24 MED ORDER — SODIUM CHLORIDE 0.9 % IV BOLUS
1000.0000 mL | Freq: Once | INTRAVENOUS | Status: AC
  Administered 2020-09-24: 1000 mL via INTRAVENOUS

## 2020-09-24 MED ORDER — IBUPROFEN 400 MG PO TABS
600.0000 mg | ORAL_TABLET | Freq: Once | ORAL | Status: AC
  Administered 2020-09-24: 600 mg via ORAL
  Filled 2020-09-24: qty 1

## 2020-09-24 NOTE — ED Notes (Signed)
Off going nurse reported that patient is having double vision. Nurse notified MD.

## 2020-09-24 NOTE — Discharge Instructions (Addendum)
Call your primary care doctor or specialist as discussed in the next 2-3 days.   Return immediately back to the ER if:  Your symptoms worsen within the next 12-24 hours. You develop new symptoms such as new fevers, persistent vomiting, new pain, shortness of breath, or new weakness or numbness, or if you have any other concerns.  

## 2020-09-24 NOTE — ED Provider Notes (Signed)
[facility] EMERGENCY DEPT Provider Note   CSN: [id number] Arrival date & time: 09/24/20  1729     History Chief Complaint  Patient presents with   Nausea    Gary Yates. is a 87 y.o. male.  Patient presented chief complaint of vomiting.  He states he is been having vomiting off and on for the past 2 weeks.  He is Foley catheter dependent and his oncologist sent a urine sample last week which was culture positive.  He pursued treatment with Cipro but the patient had persistent vomiting so Cipro was stopped and he was changed to nitrofurantoin.  However the patient still has some nausea and vomiting so presents to the ER.  Otherwise denies any abdominal pain denies any flank pain denies fevers denies cough.  Patient states she been developing a headache today gradual onset throbbing bitemporal.      Past Medical History:  Diagnosis Date   Anemia in chronic renal disease 05/07/2017   Anxiety    Atrial fibrillation (HCC)    COPD (chronic obstructive pulmonary disease) (HCC)    pt. denies   Coronary artery disease    a. h/o Overlapping stents RCA;  b. 06/2011 Cath: patent stents, nonobs dzs, NL EF.   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   Dyspnea    with exertion   Dysrhythmia    GERD (gastroesophageal reflux disease)    Headache    History of bronchitis    History of kidney stones    History of radiation therapy 02/19/16 - 03/13/16   Testis/Scrotum: 32.4 Gy in 18 fractions   History of radiation therapy 08/07/16-08/20/16   left adrenal gland mass treated to 30 Gy in 10 fractions   Hyperlipidemia    Hypertension    Iron deficiency anemia due to chronic blood loss 08/08/2017   Low testosterone    Nephrolithiasis    OSA (obstructive sleep apnea) 11/26/2017   Osteoarthritis    shoulder   Restless leg    SVT (supraventricular tachycardia)  (HCC)    Urinary frequency    Wears partial dentures    upper and lower    Patient Active Problem List   Diagnosis Date Noted   COPD (chronic obstructive pulmonary disease) ([place])    Hypercoagulable state due to atrial fibrillation ([place]) 08/13/2019   Suprapubic catheter ([place]) 08/13/2019   History of non-Hodgkin's lymphoma 08/13/2019   Cardiac arrhythmia 08/07/2019   Aortic atherosclerosis ([place]) 08/07/2019   OSA (obstructive sleep apnea) 11/26/2017   Iron deficiency anemia due to chronic blood loss 08/08/2017   Sepsis secondary to UTI ([place]) 06/17/2017   Thrombocytopenia ([place]) 06/17/2017   Urinary retention 06/17/2017   Anemia in chronic renal disease 05/07/2017   V-tach ([place]) 07/02/2016   Protein-calorie malnutrition, severe 07/02/2016   Bilateral lower extremity edema 07/01/2016   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   Lumbosacral spondylosis with radiculopathy 02/07/2015   At high risk for falls 05/05/2014   Encounter for therapeutic drug monitoring 03/15/2013   Chest pain 02/03/2013   Obesity (BMI 30-39.9) 09/21/2012   Type 2 diabetes mellitus with diabetic neuropathy, unspecified ([place]) 07/03/2012   Chronic kidney disease, stage III (moderate) ([place]) 07/03/2012   Restless leg syndrome 12/04/2011   Long term current use of anticoagulant therapy 07/15/2011   Paroxysmal atrial fibrillation ([place]) 06/21/2011   Upper extremity weakness 06/21/2011  Diabetic peripheral neuropathy ([place]) 12/17/2010   Hypotestosteronism 05/07/2010   LEG CRAMPS, IDIOPATHIC 08/14/2009   Insomnia 08/14/2009   Malignant neoplasm of skin of face 08/14/2009   OBESITY 08/02/2009   HYPERTENSION, HEART CONTROLLED W/O ASSOC CHF 03/28/2008   G E R D 01/12/2007   Morbid obesity ([place]) 09/15/2006   Hyperlipidemia, mixed 07/01/2006   Essential hypertension 07/01/2006   Coronary atherosclerosis 07/01/2006   OSTEOARTHRITIS 07/01/2006    Past Surgical History:  Procedure Laterality Date    [place]  01/2013   CATARACT EXTRACTION, BILATERAL     CHOLECYSTECTOMY     COLONOSCOPY     CORONARY ANGIOPLASTY  2004   CYSTOSCOPY N/A 08/18/2017   Procedure: CYSTOSCOPY WITH FULGURATION AND SUPRA PUBIC TUBE PLACEMENT;  Surgeon: Kathie Rhodes, MD;  Location: [facility]S;  Service: Urology;  Laterality: N/A;   EYE SURGERY Bilateral    cataracts   IR GENERIC HISTORICAL  10/05/2015   IR US GUIDE VASC ACCESS RIGHT 10/05/2015 Marybelle Killings, MD [facility]   IR GENERIC HISTORICAL  10/05/2015   IR FLUORO GUIDE PORT INSERTION RIGHT 10/05/2015 Marybelle Killings, MD [facility]   LEFT HEART CATHETERIZATION WITH CORONARY ANGIOGRAM N/A 06/18/2011   Procedure: LEFT HEART CATHETERIZATION WITH CORONARY ANGIOGRAM;  Surgeon: Peter M Martinique, MD;  Location: [facility] LAB;  Service: Cardiovascular;  Laterality: N/A;   LEFT HEART CATHETERIZATION WITH CORONARY ANGIOGRAM N/A 01/27/2013   Procedure: LEFT HEART CATHETERIZATION WITH CORONARY ANGIOGRAM;  Surgeon: Burnell Blanks, MD;  Location: [facility] CATH LAB;  Service: Cardiovascular;  Laterality: N/A;   LUMBAR LAMINECTOMY/DECOMPRESSION MICRODISCECTOMY N/A 02/07/2015   Procedure: Lumbar three-Sacral one Decompression;  Surgeon: Kevan Ny Ditty, MD;  Location: [facility] NEURO ORS;  Service: Neurosurgery;  Laterality: N/A;  L3 to S1 Decompression   MULTIPLE TOOTH EXTRACTIONS     ORCHIECTOMY Right 09/01/2015   Procedure: RIGHT ORCHIECTOMY;  Surgeon: Kathie Rhodes, MD;  Location: [facility];  Service: Urology;  Laterality: Right;   port a cath in place      ROTATOR CUFF REPAIR Left        Family History  Problem Relation Age of Onset   Alzheimer's disease Mother    Heart disease Mother    Heart disease Father    Migraines Father    Ulcers Father    Prostate cancer Brother    Coronary artery disease Other        Male 1st degree relative <50   Coronary artery disease Other        male 1st degree relative <60   Heart  disease Sister    Obesity Sister        Morbid   Arthritis Sister    Heart disease Brother    Arthritis Brother    Sleep apnea Son    Obesity Son    Migraines Daughter    Thyroid disease Daughter     Social History   Tobacco Use   Smoking status: Former    Packs/day: 1.50    Years: 20.00    Pack years: 30.00    Types: Cigarettes    Quit date: 04/05/1978    Years since quitting: 42.5   Smokeless tobacco: Never  Vaping Use   Vaping Use: Never used  Substance Use Topics   Alcohol use: No   Drug use: No    Home Medications Prior to Admission medications   Medication Sig Start Date End Date Taking?  Authorizing Provider  acetaminophen (TYLENOL) 500 MG tablet Take 1,000 mg by mouth every 6 (six) hours as needed (for pain/fever/headaches.).     [provider]  atorvastatin (LIPITOR) 40 MG tablet TAKE 1 TABLET BY MOUTH EVERY DAY 06/30/20   [name], Alinda Sierras, MD  chlorhexidine (PERIDEX) 0.12 % solution Use as directed 15 mLs in the mouth or throat 2 (two) times daily.  12/07/19   [provider]  ciprofloxacin (CIPRO) 500 MG tablet Take 1 tablet (500 mg total) by mouth daily with breakfast. 09/14/20   Volanda Napoleon, MD  docusate sodium (COLACE) 50 MG capsule Take 1 capsule (50 mg total) by mouth 2 (two) times daily as needed (for constipation). Patient not taking: Reported on 09/14/2020 04/22/16   [name], MD  escitalopram (LEXAPRO) 10 MG tablet TAKE 1/2 TABLET ONCE DAILY FOR ONE WEEK AND THEN INCREASE TO ONE DAILY. 09/18/20   [name], Alinda Sierras, MD  furosemide (LASIX) 40 MG tablet TAKE 1 TABLET BY MOUTH EVERY DAY Patient not taking: Reported on 09/14/2020 05/01/20   [name], MD  gabapentin (NEURONTIN) 300 MG capsule TAKE 2 CAPSULES BY MOUTH 3 TIMES A DAY Patient taking differently: 3 two times a day 06/08/20   [name], Alinda Sierras, MD  glucose blood (ACCU-CHEK AVIVA PLUS) test strip Test 2 times a day. Patient not taking: Reported on 09/14/2020 08/14/20    [name], MD  ibuprofen (ADVIL) 200 MG tablet Take 600 mg by mouth every 3 (three) hours as needed for moderate pain.    [provider]  levothyroxine (SYNTHROID) 25 MCG tablet TAKE 1 TABLET BY MOUTH EVERY DAY 08/23/20   [name], Alinda Sierras, MD  metFORMIN (GLUCOPHAGE) 500 MG tablet Take 1 tablet (500 mg total) by mouth 2 (two) times daily with a meal. 01/13/20   [name], Alinda Sierras, MD  metoprolol succinate (TOPROL-XL) 25 MG 24 hr tablet TAKE 1 TABLET BY MOUTH EVERY DAY Patient not taking: Reported on 09/14/2020 02/03/20   Burnell Blanks, MD  nitroGLYCERIN (NITROSTAT) 0.4 MG SL tablet Place 1 tablet (0.4 mg total) under the tongue every 5 (five) minutes as needed. Chest pain Patient not taking: Reported on 09/14/2020 05/09/20   [name], MD  ondansetron (ZOFRAN) 4 MG tablet Take one tablet by mouth ever 8 hours as needed for nausea. 09/20/20   [name], Alinda Sierras, MD  pantoprazole (PROTONIX) 40 MG tablet TAKE 1 TABLET BY MOUTH EVERY DAY 06/30/20   [name], Alinda Sierras, MD  polyethylene glycol (MIRALAX / GLYCOLAX) 17 g packet Take 17 g by mouth daily as needed for mild constipation.     [provider]  potassium chloride SA (KLOR-CON M20) 20 MEQ tablet Take 1 tablet (20 mEq total) by mouth daily. 12/15/19   [name], Alinda Sierras, MD  pramipexole (MIRAPEX) 1.5 MG tablet TAKE 2 TABLETS BY MOUTH EVERY EVENING 09/22/20   [name], Alinda Sierras, MD  warfarin (COUMADIN) 5 MG tablet Take 1 tablet daily or Take as directed by anticoagulation clinic 06/08/20   [name], MD    Allergies    Latex, Ace inhibitors, Codeine, and Penicillins  Review of Systems   Review of Systems  Constitutional:  Negative for fever.  HENT:  Negative for ear pain and sore throat.   Eyes:  Negative for pain.  Respiratory:  Negative for cough.   Cardiovascular:  Negative for chest pain.  Gastrointestinal:  Negative for abdominal pain.  Genitourinary:  Negative for flank pain.  Musculoskeletal:  Negative for back pain.  Skin:  Negative for color change and rash.  Neurological:  Positive for headaches. Negative for syncope.  All other systems reviewed and are negative.  Physical Exam Updated Vital Signs BP (!) 153/115   Pulse 89   Temp 97.9 F (36.6 C) (Oral)   Resp 19   SpO2 100%   Physical Exam Constitutional:      Appearance: He is well-developed.  HENT:     Head: Normocephalic.     Nose: Nose normal.  Eyes:     Extraocular Movements: Extraocular movements intact.  Cardiovascular:     Rate and Rhythm: Normal rate.  Pulmonary:     Effort: Pulmonary effort is normal.  Abdominal:     Tenderness: There is no abdominal tenderness. There is no guarding or rebound.  Skin:    Coloration: Skin is not jaundiced.  Neurological:     General: No focal deficit present.     Mental Status: He is alert and oriented to person, place, and time. Mental status is at baseline.     Cranial Nerves: No cranial nerve deficit.     Motor: No weakness.    ED Results / Procedures / Treatments   Labs (all labs ordered are listed, but only abnormal results are displayed) Labs Reviewed  CBC WITH DIFFERENTIAL/PLATELET - Abnormal; Notable for the following components:      Result Value   RBC 3.66 (*)    Hemoglobin 11.5 (*)    HCT 34.9 (*)    RDW 16.3 (*)    Platelets 122 (*)    All other components within normal limits  BASIC METABOLIC PANEL - Abnormal; Notable for the following components:   Glucose, Bld 110 (*)    BUN 25 (*)    Creatinine, Ser 2.19 (*)    GFR, Estimated 29 (*)    All other components within normal limits    EKG None  Radiology CT Head Wo Contrast  Result Date: 09/24/2020 CLINICAL DATA:  Headache.  Double vision. EXAM: CT HEAD WITHOUT CONTRAST TECHNIQUE: Contiguous axial images were obtained from the base of the skull through the vertex without intravenous contrast. COMPARISON:  CT head 07/23/2019. FINDINGS: Brain: No evidence of acute  infarction, hemorrhage, hydrocephalus, extra-axial collection or mass lesion/mass effect. Again seen is mild diffuse atrophy, unchanged. There is stable mild patchy periventricular and deep white matter hypodensity, likely chronic small vessel ischemic change. Small calcified meningioma measuring 7 mm overlying the right tentorium appears unchanged from the prior exam. Vascular: Atherosclerotic calcifications are present within the cavernous internal carotid arteries. Skull: Normal. Negative for fracture or focal lesion. Sinuses/Orbits: No acute finding. Other: None. IMPRESSION: No acute intracranial abnormality. Electronically Signed   By: Ronney Asters M.D.   On: 09/24/2020 21:07    Procedures Procedures   Medications Ordered in ED Medications  sodium chloride 0.9 % bolus 1,000 mL (0 mLs Intravenous Stopped 09/24/20 2037)  ondansetron (ZOFRAN) injection 4 mg (4 mg Intravenous Given 09/24/20 1903)  acetaminophen (TYLENOL) tablet 650 mg (650 mg Oral Given 09/24/20 1927)  ibuprofen (ADVIL) tablet 600 mg (600 mg Oral Given 09/24/20 1927)  prochlorperazine (COMPAZINE) injection 10 mg (10 mg Intravenous Given 09/24/20 2025)  LORazepam (ATIVAN) tablet 0.5 mg (0.5 mg Oral Given 09/24/20 2044)    ED Course  I have reviewed the triage vital signs and the nursing notes.  Pertinent labs & imaging results that were available during my care of the patient were reviewed by me and  considered in my medical decision making (see chart for details).    MDM Rules/Calculators/A&P                           Labs here today show white count of 4.8 hemoglobin 11 chemistry consistent with prior labs creatinine 2.19.  Patient given Zofran and IV fluid resuscitation.  Tolerating oral hydration now.  Given oral medication for his headache along with Compazine with subsequent improvement of headache.  Describes it as a very "low level now."  He has no abdominal pain or tenderness on exam.  Patient remains awake and alert  at his neuro baseline, not tolerating oral hydration.  Patient will be discharged home to follow-up with his doctors within the week.  Advising immediate return if he again has recurrent vomiting or if he again cannot tolerate any hydration.   Final Clinical Impression(s) / ED Diagnoses Final diagnoses:  Non-intractable vomiting with nausea, unspecified vomiting type    Rx / DC Orders ED Discharge Orders     None        Luna Fuse, MD 09/24/20 2120

## 2020-09-24 NOTE — ED Triage Notes (Signed)
Pt n/v for 2 weeks. Pt was on cipro for UTI and MD changed it because of his n/v to nitrofuranton. Pt also has zofran but is not helping. Pt is unable to keep anything down.

## 2020-09-24 NOTE — ED Notes (Signed)
Patient complains of severe headache. 9/10 Pain. Dr. Almyra Free notified. Please see MAR

## 2020-09-26 ENCOUNTER — Telehealth: Payer: Self-pay | Admitting: Family Medicine

## 2020-09-26 ENCOUNTER — Ambulatory Visit (INDEPENDENT_AMBULATORY_CARE_PROVIDER_SITE_OTHER): Payer: Medicare Other

## 2020-09-26 DIAGNOSIS — Z7901 Long term (current) use of anticoagulants: Secondary | ICD-10-CM

## 2020-09-26 LAB — POCT INR: INR: 5.8 — AB (ref 2.0–3.0)

## 2020-09-26 NOTE — Telephone Encounter (Signed)
Pt call and stated he want dr.[name] to know that he have to CAT Scan .

## 2020-09-26 NOTE — Telephone Encounter (Signed)
CT order for Abdomen/Pelvis has been placed by Dr. Marin Olp.

## 2020-09-26 NOTE — Patient Instructions (Addendum)
Pre visit review using our clinic review tool, if applicable. No additional management support is needed unless otherwise documented below in the visit note.  Hold dose today and hold dose tomorrow and then change weekly dose to take 1 tablet daily except take 1/2 tablet on Mon and Thurs. Recheck in 2 wks.

## 2020-09-28 ENCOUNTER — Inpatient Hospital Stay (HOSPITAL_COMMUNITY)
Admission: EM | Admit: 2020-09-28 | Discharge: 2020-10-10 | DRG: 841 | Disposition: A | Discharge status: Home Health | Payer: Medicare Other | Attending: Internal Medicine | Admitting: Internal Medicine

## 2020-09-28 ENCOUNTER — Other Ambulatory Visit: Payer: Self-pay

## 2020-09-28 ENCOUNTER — Emergency Department (HOSPITAL_COMMUNITY): Payer: Medicare Other

## 2020-09-28 ENCOUNTER — Ambulatory Visit (HOSPITAL_COMMUNITY): Payer: Medicare Other

## 2020-09-28 ENCOUNTER — Telehealth: Payer: Self-pay | Admitting: *Deleted

## 2020-09-28 ENCOUNTER — Encounter (HOSPITAL_COMMUNITY): Payer: Self-pay | Admitting: Oncology

## 2020-09-28 DIAGNOSIS — E1142 Type 2 diabetes mellitus with diabetic polyneuropathy: Secondary | ICD-10-CM | POA: Diagnosis present

## 2020-09-28 DIAGNOSIS — Z8572 Personal history of non-Hodgkin lymphomas: Secondary | ICD-10-CM | POA: Diagnosis not present

## 2020-09-28 DIAGNOSIS — I509 Heart failure, unspecified: Secondary | ICD-10-CM | POA: Diagnosis present

## 2020-09-28 DIAGNOSIS — D631 Anemia in chronic kidney disease: Secondary | ICD-10-CM | POA: Diagnosis present

## 2020-09-28 DIAGNOSIS — Z7189 Other specified counseling: Secondary | ICD-10-CM | POA: Diagnosis not present

## 2020-09-28 DIAGNOSIS — G629 Polyneuropathy, unspecified: Secondary | ICD-10-CM | POA: Diagnosis not present

## 2020-09-28 DIAGNOSIS — I480 Paroxysmal atrial fibrillation: Secondary | ICD-10-CM | POA: Diagnosis not present

## 2020-09-28 DIAGNOSIS — J449 Chronic obstructive pulmonary disease, unspecified: Secondary | ICD-10-CM | POA: Diagnosis present

## 2020-09-28 DIAGNOSIS — Z20822 Contact with and (suspected) exposure to covid-19: Secondary | ICD-10-CM | POA: Diagnosis present

## 2020-09-28 DIAGNOSIS — Z79890 Hormone replacement therapy (postmenopausal): Secondary | ICD-10-CM

## 2020-09-28 DIAGNOSIS — C8591 Non-Hodgkin lymphoma, unspecified, lymph nodes of head, face, and neck: Secondary | ICD-10-CM | POA: Diagnosis present

## 2020-09-28 DIAGNOSIS — N1832 Chronic kidney disease, stage 3b: Secondary | ICD-10-CM | POA: Diagnosis not present

## 2020-09-28 DIAGNOSIS — Z515 Encounter for palliative care: Secondary | ICD-10-CM | POA: Diagnosis not present

## 2020-09-28 DIAGNOSIS — R633 Feeding difficulties, unspecified: Secondary | ICD-10-CM | POA: Diagnosis present

## 2020-09-28 DIAGNOSIS — Z820 Family history of epilepsy and other diseases of the nervous system: Secondary | ICD-10-CM

## 2020-09-28 DIAGNOSIS — K219 Gastro-esophageal reflux disease without esophagitis: Secondary | ICD-10-CM | POA: Diagnosis present

## 2020-09-28 DIAGNOSIS — N181 Chronic kidney disease, stage 1: Secondary | ICD-10-CM | POA: Diagnosis not present

## 2020-09-28 DIAGNOSIS — R112 Nausea with vomiting, unspecified: Secondary | ICD-10-CM | POA: Diagnosis not present

## 2020-09-28 DIAGNOSIS — I129 Hypertensive chronic kidney disease with stage 1 through stage 4 chronic kidney disease, or unspecified chronic kidney disease: Secondary | ICD-10-CM | POA: Diagnosis not present

## 2020-09-28 DIAGNOSIS — Z91040 Latex allergy status: Secondary | ICD-10-CM

## 2020-09-28 DIAGNOSIS — I130 Hypertensive heart and chronic kidney disease with heart failure and stage 1 through stage 4 chronic kidney disease, or unspecified chronic kidney disease: Secondary | ICD-10-CM | POA: Diagnosis present

## 2020-09-28 DIAGNOSIS — H9190 Unspecified hearing loss, unspecified ear: Secondary | ICD-10-CM | POA: Diagnosis present

## 2020-09-28 DIAGNOSIS — Z8547 Personal history of malignant neoplasm of testis: Secondary | ICD-10-CM

## 2020-09-28 DIAGNOSIS — Z9861 Coronary angioplasty status: Secondary | ICD-10-CM

## 2020-09-28 DIAGNOSIS — N179 Acute kidney failure, unspecified: Secondary | ICD-10-CM | POA: Diagnosis not present

## 2020-09-28 DIAGNOSIS — Z8249 Family history of ischemic heart disease and other diseases of the circulatory system: Secondary | ICD-10-CM

## 2020-09-28 DIAGNOSIS — R791 Abnormal coagulation profile: Secondary | ICD-10-CM | POA: Diagnosis present

## 2020-09-28 DIAGNOSIS — R519 Headache, unspecified: Secondary | ICD-10-CM

## 2020-09-28 DIAGNOSIS — Z66 Do not resuscitate: Secondary | ICD-10-CM | POA: Diagnosis not present

## 2020-09-28 DIAGNOSIS — I10 Essential (primary) hypertension: Secondary | ICD-10-CM | POA: Diagnosis not present

## 2020-09-28 DIAGNOSIS — I447 Left bundle-branch block, unspecified: Secondary | ICD-10-CM | POA: Diagnosis present

## 2020-09-28 DIAGNOSIS — E561 Deficiency of vitamin K: Secondary | ICD-10-CM | POA: Diagnosis present

## 2020-09-28 DIAGNOSIS — H9313 Tinnitus, bilateral: Secondary | ICD-10-CM | POA: Diagnosis present

## 2020-09-28 DIAGNOSIS — C8589 Other specified types of non-Hodgkin lymphoma, extranodal and solid organ sites: Secondary | ICD-10-CM | POA: Diagnosis present

## 2020-09-28 DIAGNOSIS — Z9841 Cataract extraction status, right eye: Secondary | ICD-10-CM

## 2020-09-28 DIAGNOSIS — E039 Hypothyroidism, unspecified: Secondary | ICD-10-CM

## 2020-09-28 DIAGNOSIS — H510 Palsy (spasm) of conjugate gaze: Secondary | ICD-10-CM | POA: Diagnosis present

## 2020-09-28 DIAGNOSIS — Z9359 Other cystostomy status: Secondary | ICD-10-CM

## 2020-09-28 DIAGNOSIS — N170 Acute kidney failure with tubular necrosis: Secondary | ICD-10-CM | POA: Diagnosis not present

## 2020-09-28 DIAGNOSIS — G8929 Other chronic pain: Secondary | ICD-10-CM | POA: Diagnosis not present

## 2020-09-28 DIAGNOSIS — G2581 Restless legs syndrome: Secondary | ICD-10-CM | POA: Diagnosis present

## 2020-09-28 DIAGNOSIS — N390 Urinary tract infection, site not specified: Secondary | ICD-10-CM | POA: Diagnosis present

## 2020-09-28 DIAGNOSIS — R0602 Shortness of breath: Secondary | ICD-10-CM | POA: Diagnosis not present

## 2020-09-28 DIAGNOSIS — D509 Iron deficiency anemia, unspecified: Secondary | ICD-10-CM | POA: Diagnosis present

## 2020-09-28 DIAGNOSIS — D696 Thrombocytopenia, unspecified: Secondary | ICD-10-CM | POA: Diagnosis present

## 2020-09-28 DIAGNOSIS — R2689 Other abnormalities of gait and mobility: Secondary | ICD-10-CM | POA: Diagnosis present

## 2020-09-28 DIAGNOSIS — R1110 Vomiting, unspecified: Secondary | ICD-10-CM | POA: Diagnosis not present

## 2020-09-28 DIAGNOSIS — E222 Syndrome of inappropriate secretion of antidiuretic hormone: Secondary | ICD-10-CM | POA: Diagnosis present

## 2020-09-28 DIAGNOSIS — E1122 Type 2 diabetes mellitus with diabetic chronic kidney disease: Secondary | ICD-10-CM | POA: Diagnosis present

## 2020-09-28 DIAGNOSIS — F419 Anxiety disorder, unspecified: Secondary | ICD-10-CM | POA: Diagnosis present

## 2020-09-28 DIAGNOSIS — Z923 Personal history of irradiation: Secondary | ICD-10-CM

## 2020-09-28 DIAGNOSIS — E11649 Type 2 diabetes mellitus with hypoglycemia without coma: Secondary | ICD-10-CM | POA: Diagnosis present

## 2020-09-28 DIAGNOSIS — Z7901 Long term (current) use of anticoagulants: Secondary | ICD-10-CM

## 2020-09-28 DIAGNOSIS — N1831 Chronic kidney disease, stage 3a: Secondary | ICD-10-CM | POA: Diagnosis not present

## 2020-09-28 DIAGNOSIS — E785 Hyperlipidemia, unspecified: Secondary | ICD-10-CM | POA: Diagnosis present

## 2020-09-28 DIAGNOSIS — Z9221 Personal history of antineoplastic chemotherapy: Secondary | ICD-10-CM

## 2020-09-28 DIAGNOSIS — Z87442 Personal history of urinary calculi: Secondary | ICD-10-CM

## 2020-09-28 DIAGNOSIS — Z888 Allergy status to other drugs, medicaments and biological substances status: Secondary | ICD-10-CM

## 2020-09-28 DIAGNOSIS — R2681 Unsteadiness on feet: Secondary | ICD-10-CM | POA: Diagnosis present

## 2020-09-28 DIAGNOSIS — G4733 Obstructive sleep apnea (adult) (pediatric): Secondary | ICD-10-CM | POA: Diagnosis present

## 2020-09-28 DIAGNOSIS — Z8042 Family history of malignant neoplasm of prostate: Secondary | ICD-10-CM

## 2020-09-28 DIAGNOSIS — Z8261 Family history of arthritis: Secondary | ICD-10-CM

## 2020-09-28 DIAGNOSIS — I2510 Atherosclerotic heart disease of native coronary artery without angina pectoris: Secondary | ICD-10-CM | POA: Diagnosis present

## 2020-09-28 DIAGNOSIS — Z9842 Cataract extraction status, left eye: Secondary | ICD-10-CM

## 2020-09-28 DIAGNOSIS — R296 Repeated falls: Secondary | ICD-10-CM | POA: Diagnosis present

## 2020-09-28 DIAGNOSIS — Z7984 Long term (current) use of oral hypoglycemic drugs: Secondary | ICD-10-CM

## 2020-09-28 DIAGNOSIS — H532 Diplopia: Secondary | ICD-10-CM | POA: Diagnosis present

## 2020-09-28 DIAGNOSIS — Z79899 Other long term (current) drug therapy: Secondary | ICD-10-CM

## 2020-09-28 DIAGNOSIS — E860 Dehydration: Secondary | ICD-10-CM | POA: Diagnosis present

## 2020-09-28 DIAGNOSIS — E876 Hypokalemia: Secondary | ICD-10-CM | POA: Diagnosis not present

## 2020-09-28 DIAGNOSIS — N189 Chronic kidney disease, unspecified: Secondary | ICD-10-CM | POA: Diagnosis not present

## 2020-09-28 DIAGNOSIS — Z87891 Personal history of nicotine dependence: Secondary | ICD-10-CM

## 2020-09-28 DIAGNOSIS — Z9049 Acquired absence of other specified parts of digestive tract: Secondary | ICD-10-CM

## 2020-09-28 DIAGNOSIS — Z885 Allergy status to narcotic agent status: Secondary | ICD-10-CM

## 2020-09-28 LAB — COMPREHENSIVE METABOLIC PANEL
ALT: 7 U/L (ref 0–44)
AST: 14 U/L — ABNORMAL LOW (ref 15–41)
Albumin: 3.7 g/dL (ref 3.5–5.0)
Alkaline Phosphatase: 86 U/L (ref 38–126)
Anion gap: 11 (ref 5–15)
BUN: 37 mg/dL — ABNORMAL HIGH (ref 8–23)
CO2: 22 mmol/L (ref 22–32)
Calcium: 9.3 mg/dL (ref 8.9–10.3)
Chloride: 102 mmol/L (ref 98–111)
Creatinine, Ser: 2.62 mg/dL — ABNORMAL HIGH (ref 0.61–1.24)
GFR, Estimated: 23 mL/min — ABNORMAL LOW (ref >60)
Glucose, Bld: 125 mg/dL — ABNORMAL HIGH (ref 70–99)
Potassium: 4.3 mmol/L (ref 3.5–5.1)
Sodium: 135 mmol/L (ref 135–145)
Total Bilirubin: 0.7 mg/dL (ref 0.3–1.2)
Total Protein: 6.7 g/dL (ref 6.5–8.1)

## 2020-09-28 LAB — TROPONIN I (HIGH SENSITIVITY)
Troponin I (High Sensitivity): 30 ng/L — ABNORMAL HIGH (ref <18)
Troponin I (High Sensitivity): 36 ng/L — ABNORMAL HIGH (ref <18)

## 2020-09-28 LAB — URINALYSIS, ROUTINE W REFLEX MICROSCOPIC
Bilirubin Urine: NEGATIVE
Glucose, UA: NEGATIVE mg/dL
Ketones, ur: NEGATIVE mg/dL
Leukocytes,Ua: NEGATIVE
Nitrite: NEGATIVE
Protein, ur: 30 mg/dL — AB
Specific Gravity, Urine: <1.005 — ABNORMAL LOW (ref 1.005–1.030)
pH: 6 (ref 5.0–8.0)

## 2020-09-28 LAB — CBC WITH DIFFERENTIAL/PLATELET
Abs Immature Granulocytes: 0.01 10*3/uL (ref 0.00–0.07)
Basophils Absolute: 0.1 10*3/uL (ref 0.0–0.1)
Basophils Relative: 1 %
Eosinophils Absolute: 0.1 10*3/uL (ref 0.0–0.5)
Eosinophils Relative: 2 %
HCT: 33.2 % — ABNORMAL LOW (ref 39.0–52.0)
Hemoglobin: 10.7 g/dL — ABNORMAL LOW (ref 13.0–17.0)
Immature Granulocytes: 0 %
Lymphocytes Relative: 22 %
Lymphs Abs: 1.1 10*3/uL (ref 0.7–4.0)
MCH: 31.7 pg (ref 26.0–34.0)
MCHC: 32.2 g/dL (ref 30.0–36.0)
MCV: 98.2 fL (ref 80.0–100.0)
Monocytes Absolute: 0.4 10*3/uL (ref 0.1–1.0)
Monocytes Relative: 8 %
Neutro Abs: 3.5 10*3/uL (ref 1.7–7.7)
Neutrophils Relative %: 67 %
Platelets: 119 10*3/uL — ABNORMAL LOW (ref 150–400)
RBC: 3.38 MIL/uL — ABNORMAL LOW (ref 4.22–5.81)
RDW: 16.5 % — ABNORMAL HIGH (ref 11.5–15.5)
WBC: 5.2 10*3/uL (ref 4.0–10.5)
nRBC: 0 % (ref 0.0–0.2)

## 2020-09-28 LAB — VITAMIN B12: Vitamin B-12: 1214 pg/mL — ABNORMAL HIGH (ref 180–914)

## 2020-09-28 LAB — URINALYSIS, MICROSCOPIC (REFLEX)
RBC / HPF: >50 RBC/hpf (ref 0–5)
Squamous Epithelial / HPF: NONE SEEN (ref 0–5)

## 2020-09-28 LAB — T4, FREE: Free T4: 1.13 ng/dL — ABNORMAL HIGH (ref 0.61–1.12)

## 2020-09-28 LAB — PROTIME-INR
INR: 4.6 (ref 0.8–1.2)
Prothrombin Time: 43.6 seconds — ABNORMAL HIGH (ref 11.4–15.2)

## 2020-09-28 LAB — RESP PANEL BY RT-PCR (FLU A&B, COVID) ARPGX2
Influenza A by PCR: NEGATIVE
Influenza B by PCR: NEGATIVE
SARS Coronavirus 2 by RT PCR: NEGATIVE

## 2020-09-28 LAB — TSH: TSH: 1.771 u[IU]/mL (ref 0.350–4.500)

## 2020-09-28 LAB — LIPASE, BLOOD: Lipase: 51 U/L (ref 11–51)

## 2020-09-28 MED ORDER — ACETAMINOPHEN 325 MG PO TABS
650.0000 mg | ORAL_TABLET | Freq: Four times a day (QID) | ORAL | Status: DC | PRN
  Administered 2020-09-29 – 2020-10-06 (×6): 650 mg via ORAL
  Filled 2020-09-28 (×7): qty 2

## 2020-09-28 MED ORDER — LACTATED RINGERS IV BOLUS
1000.0000 mL | Freq: Once | INTRAVENOUS | Status: AC
  Administered 2020-09-28: 1000 mL via INTRAVENOUS

## 2020-09-28 MED ORDER — PANTOPRAZOLE SODIUM 40 MG PO TBEC
40.0000 mg | DELAYED_RELEASE_TABLET | Freq: Every day | ORAL | Status: DC
  Administered 2020-09-28 – 2020-10-10 (×13): 40 mg via ORAL
  Filled 2020-09-28 (×13): qty 1

## 2020-09-28 MED ORDER — LORAZEPAM 2 MG/ML IJ SOLN
0.5000 mg | Freq: Once | INTRAMUSCULAR | Status: DC
Start: 2020-09-28 — End: 2020-10-04
  Filled 2020-09-28: qty 1

## 2020-09-28 MED ORDER — ATORVASTATIN CALCIUM 40 MG PO TABS
40.0000 mg | ORAL_TABLET | Freq: Every day | ORAL | Status: DC
  Administered 2020-09-28 – 2020-10-10 (×13): 40 mg via ORAL
  Filled 2020-09-28 (×13): qty 1

## 2020-09-28 MED ORDER — DIPHENHYDRAMINE HCL 50 MG/ML IJ SOLN
12.5000 mg | Freq: Once | INTRAMUSCULAR | Status: AC
  Administered 2020-09-28: 12.5 mg via INTRAVENOUS
  Filled 2020-09-28: qty 1

## 2020-09-28 MED ORDER — LEVOTHYROXINE SODIUM 25 MCG PO TABS: 25.0000 ug | ORAL_TABLET | Freq: Every day | ORAL | Status: DC

## 2020-09-28 MED ORDER — PROCHLORPERAZINE EDISYLATE 10 MG/2ML IJ SOLN
5.0000 mg | Freq: Four times a day (QID) | INTRAMUSCULAR | Status: DC | PRN
  Administered 2020-09-28 – 2020-10-03 (×9): 5 mg via INTRAVENOUS
  Filled 2020-09-28 (×9): qty 2

## 2020-09-28 MED ORDER — VITAMIN B-12 1000 MCG PO TABS
1000.0000 ug | ORAL_TABLET | Freq: Every day | ORAL | Status: DC
  Administered 2020-09-29 – 2020-10-10 (×12): 1000 ug via ORAL
  Filled 2020-09-28 (×12): qty 1

## 2020-09-28 MED ORDER — LEVOTHYROXINE SODIUM 25 MCG PO TABS
25.0000 ug | ORAL_TABLET | Freq: Every day | ORAL | Status: DC
  Administered 2020-09-29 – 2020-10-10 (×12): 25 ug via ORAL
  Filled 2020-09-28 (×12): qty 1

## 2020-09-28 MED ORDER — LACTATED RINGERS IV SOLN: INTRAVENOUS | Status: AC

## 2020-09-28 MED ORDER — GABAPENTIN 300 MG PO CAPS
600.0000 mg | ORAL_CAPSULE | Freq: Two times a day (BID) | ORAL | Status: DC
  Administered 2020-09-28 – 2020-10-10 (×23): 600 mg via ORAL
  Filled 2020-09-28 (×25): qty 2

## 2020-09-28 MED ORDER — METOCLOPRAMIDE HCL 5 MG/ML IJ SOLN
10.0000 mg | Freq: Once | INTRAMUSCULAR | Status: AC
  Administered 2020-09-28: 10 mg via INTRAVENOUS
  Filled 2020-09-28: qty 2

## 2020-09-28 MED ORDER — PRAMIPEXOLE DIHYDROCHLORIDE 1.5 MG PO TABS
3.0000 mg | ORAL_TABLET | Freq: Every day | ORAL | Status: DC
  Administered 2020-09-28 – 2020-10-09 (×11): 3 mg via ORAL
  Filled 2020-09-28 (×4): qty 2
  Filled 2020-09-28: qty 3
  Filled 2020-09-28 (×9): qty 2

## 2020-09-28 MED ORDER — FENTANYL CITRATE PF 50 MCG/ML IJ SOSY
12.5000 ug | PREFILLED_SYRINGE | Freq: Four times a day (QID) | INTRAMUSCULAR | Status: DC | PRN
  Administered 2020-09-28 – 2020-10-08 (×5): 12.5 ug via INTRAVENOUS
  Filled 2020-09-28 (×5): qty 1

## 2020-09-28 MED ORDER — ESCITALOPRAM OXALATE 10 MG PO TABS
10.0000 mg | ORAL_TABLET | Freq: Every day | ORAL | Status: DC
  Administered 2020-09-28 – 2020-10-10 (×13): 10 mg via ORAL
  Filled 2020-09-28 (×13): qty 1

## 2020-09-28 MED ORDER — POLYETHYLENE GLYCOL 3350 17 G PO PACK: 17.0000 g | PACK | Freq: Every day | ORAL | Status: DC | PRN

## 2020-09-28 MED ORDER — ONDANSETRON HCL 4 MG/2ML IJ SOLN
4.0000 mg | Freq: Four times a day (QID) | INTRAMUSCULAR | Status: DC | PRN
  Administered 2020-09-28 – 2020-10-03 (×7): 4 mg via INTRAVENOUS
  Filled 2020-09-28 (×6): qty 2

## 2020-09-28 NOTE — ED Provider Notes (Signed)
Patient's INR still elevated at 4.6.  He will have to wait for LPN till INR is more therapeutic.   Blanchie Dessert, MD 09/28/20 [phone]

## 2020-09-28 NOTE — H&P (Signed)
History and Physical    Gary Yates. NLZ:[id number] DOB: February 06, 1933 DOA: 09/28/2020  PCP: [name], MD  Patient coming from: Home  I have personally briefly reviewed patient's old medical records in [place]  Chief Complaint: Headache, intractable nausea  HPI: Gary Yates. is a 87 y.o. 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  Daughter at bedside provides some of the history.  Patient reports that for at least the past 3 weeks he has had an occipital headache with neck pain as well as nausea and vomiting.  States headache and nausea and vomiting is worse when he first wakes up and also after meals. Headache improves with 2 Tylenols but returns shortly after.  Also has intermittent double vision worse later in the day.  He normally ambulates with a walker and has a shuffling gait but daughter states it has been worse in the past several weeks.  He has had several falls due to unsteady gait in the past month and reported that he hit his head on a cabinet several weeks ago.  Otherwise he denies any focal extremity weakness, new numbness or tingling.  Has had weight loss but unclear how much.  No fevers.  No abdominal pain or diarrhea.  No chest pain or shortness of breath.  He has history of relapsed non-Hodgkin's lymphoma that has been in remission for several years followed by oncology.  No family history of malignancies in first-degree relatives.  Patient previously was evaluated in the ED on 9/18 for at least 2 weeks of nausea, vomiting and headache.  He had no leukocytosis and stable hemoglobin.  CT head was negative for acute findings.  He was treated with antiemetics and IV  fluid resuscitation with improvement and was discharged. Prior to this, he was diagnosed with UTI with oncology on 9/8 and was started on Cipro but could not tolerate due to nausea and vomiting and switched to nitrofurantoin.  In the ED today, his UA was negative but MRI of the brain without contrast had abnormal signal involving the dorsal brainstem with possible minor mass-effect.  He had no leukocytosis, hemoglobin of 10.7, platelet has decreased to 119.  Creatinine of 2.62 from prior of 2.19.  BG of 125.  Troponin of 30 and 36.  TSH was normal.  ED physician discussed with neurology and LP was recommended but resulting INR was supratherapeutic at 4.6.  Last dose of Coumadin was overnight.  Neurology recommends transfer to [facility] and they will see in consultation at bedside.  Hospitalist then called for admission.  Review of Systems: Constitutional: + Weight Change, No Fever ENT/Mouth: No sore throat, No Rhinorrhea Eyes: +Eye Pain, + Vision Changes Cardiovascular: No Chest Pain, no SOB Respiratory: No Cough, No Sputum Gastrointestinal: +Nausea, + Vomiting, No Diarrhea, No Constipation, No Pain Genitourinary:No Urgency, No Flank Pain Musculoskeletal: No Arthralgias, No Myalgias Skin: No Skin Lesions, No Pruritus Neuro: + Weakness, No Numbness,  No Loss of Consciousness, No Syncope Psych: No Anxiety/Panic, No Depression, no decrease appetite Heme/Lymph: No Bruising, No Bleeding  Social history Patient lives alone but has children that visits him often and lays out his medication for him.  Also has home help for several hours 3 times a week.  Normally ambulates  with a walker. Denies current tobacco, alcohol illicit drug use.  Past Medical History:  Diagnosis Date   Anemia in chronic renal disease 05/07/2017   Anxiety    Atrial fibrillation (HCC)    COPD (chronic obstructive pulmonary disease) ([place])    pt. denies   Coronary artery disease    a. h/o Overlapping stents RCA;  b. 06/2011  Cath: patent stents, nonobs dzs, NL EF.   Diabetic peripheral neuropathy (HCC)    Diffuse non-Hodgkin's lymphoma of testis ([place]) 09/28/2015   DM (diabetes mellitus) ([place])    Type 2, peripheral neuropathy.   Dyspnea    with exertion   Dysrhythmia    GERD (gastroesophageal reflux disease)    Headache    History of bronchitis    History of kidney stones    History of radiation therapy 02/19/16 - 03/13/16   Testis/Scrotum: 32.4 Gy in 18 fractions   History of radiation therapy 08/07/16-08/20/16   left adrenal gland mass treated to 30 Gy in 10 fractions   Hyperlipidemia    Hypertension    Iron deficiency anemia due to chronic blood loss 08/08/2017   Low testosterone    Nephrolithiasis    OSA (obstructive sleep apnea) 11/26/2017   Osteoarthritis    shoulder   Restless leg    SVT (supraventricular tachycardia) ([place])    Urinary frequency    Wears partial dentures    upper and lower    Past Surgical History:  Procedure Laterality Date   [place]  01/2013   CATARACT EXTRACTION, BILATERAL     CHOLECYSTECTOMY     COLONOSCOPY     CORONARY ANGIOPLASTY  2004   CYSTOSCOPY N/A 08/18/2017   Procedure: CYSTOSCOPY WITH FULGURATION AND SUPRA PUBIC TUBE PLACEMENT;  Surgeon: Kathie Rhodes, MD;  Location: [facility];  Service: Urology;  Laterality: N/A;   EYE SURGERY Bilateral    cataracts   IR GENERIC HISTORICAL  10/05/2015   IR US GUIDE VASC ACCESS RIGHT 10/05/2015 Marybelle Killings, MD [facility]   IR GENERIC HISTORICAL  10/05/2015   IR FLUORO GUIDE PORT INSERTION RIGHT 10/05/2015 Marybelle Killings, MD [facility]   LEFT HEART CATHETERIZATION WITH CORONARY ANGIOGRAM N/A 06/18/2011   Procedure: LEFT HEART CATHETERIZATION WITH CORONARY ANGIOGRAM;  Surgeon: Peter M Martinique, MD;  Location: [facility] CATH LAB;  Service: Cardiovascular;  Laterality: N/A;   LEFT HEART CATHETERIZATION WITH CORONARY ANGIOGRAM N/A 01/27/2013   Procedure: LEFT HEART CATHETERIZATION WITH  CORONARY ANGIOGRAM;  Surgeon: Burnell Blanks, MD;  Location: [facility] CATH LAB;  Service: Cardiovascular;  Laterality: N/A;   LUMBAR LAMINECTOMY/DECOMPRESSION MICRODISCECTOMY N/A 02/07/2015   Procedure: Lumbar three-Sacral one Decompression;  Surgeon: Kevan Ny Ditty, MD;  Location: [place] NEURO ORS;  Service: Neurosurgery;  Laterality: N/A;  L3 to S1 Decompression   MULTIPLE TOOTH EXTRACTIONS     ORCHIECTOMY Right 09/01/2015   Procedure: RIGHT ORCHIECTOMY;  Surgeon: Kathie Rhodes, MD;  Location: [facility];  Service: Urology;  Laterality: Right;   port a cath in place      ROTATOR CUFF REPAIR Left       Allergies  Allergen Reactions   Latex Other (See Comments)    Reddens the skin   Ace Inhibitors Cough   Codeine Nausea Only and Rash        Compazine [Prochlorperazine] Anxiety   Penicillins Rash    Childhood allergy Has patient had a PCN reaction causing immediate rash, facial/tongue/throat swelling, SOB  or lightheadedness with hypotension: Yes Has patient had a PCN reaction causing severe rash involving mucus membranes or skin necrosis: Yes Has patient had a PCN reaction that required hospitalization No Has patient had a PCN reaction occurring within the last 10 years: No If all of the above answers are "NO", then may proceed with Cephalosporin use.     Family History  Problem Relation Age of Onset   Alzheimer's disease Mother    Heart disease Mother    Heart disease Father    Migraines Father    Ulcers Father    Prostate cancer Brother    Coronary artery disease Other        Male 1st degree relative <50   Coronary artery disease Other        male 1st degree relative <60   Heart disease Sister    Obesity Sister        Morbid   Arthritis Sister    Heart disease Brother    Arthritis Brother    Sleep apnea Son    Obesity Son    Migraines Daughter    Thyroid disease Daughter      Prior to Admission medications   Medication Sig Start Date End Date Taking?  Authorizing Provider  acetaminophen (TYLENOL) 500 MG tablet Take 1,000 mg by mouth every 6 (six) hours as needed for moderate pain.   Yes [provider]  atorvastatin (LIPITOR) 40 MG tablet TAKE 1 TABLET BY MOUTH EVERY DAY Patient taking differently: Take 40 mg by mouth daily. 06/30/20  Yes [name], Alinda Sierras, MD  chlorhexidine (PERIDEX) 0.12 % solution Use as directed 15 mLs in the mouth or throat 2 (two) times daily as needed (mouth pain). 12/07/19  Yes [provider]  escitalopram (LEXAPRO) 10 MG tablet TAKE 1/2 TABLET ONCE DAILY FOR ONE WEEK AND THEN INCREASE TO ONE DAILY. Patient taking differently: Take 10 mg by mouth daily. 09/18/20  Yes [name], Alinda Sierras, MD  furosemide (LASIX) 40 MG tablet Take 40 mg by mouth daily.   Yes [provider]  gabapentin (NEURONTIN) 300 MG capsule TAKE 2 CAPSULES BY MOUTH 3 TIMES A DAY Patient taking differently: Take 600 mg by mouth 2 (two) times daily. 06/08/20  Yes [name], Alinda Sierras, MD  ibuprofen (ADVIL) 200 MG tablet Take 600 mg by mouth every 3 (three) hours as needed for moderate pain.   Yes [provider]  levothyroxine (SYNTHROID) 25 MCG tablet TAKE 1 TABLET BY MOUTH EVERY DAY Patient taking differently: Take 25 mcg by mouth daily. 08/23/20  Yes [name], Alinda Sierras, MD  metFORMIN (GLUCOPHAGE) 500 MG tablet Take 1 tablet (500 mg total) by mouth 2 (two) times daily with a meal. Patient taking differently: Take 500 mg by mouth at bedtime. 01/13/20  Yes [name], Alinda Sierras, MD  nitroGLYCERIN (NITROSTAT) 0.4 MG SL tablet Place 1 tablet (0.4 mg total) under the tongue every 5 (five) minutes as needed. Chest pain Patient taking differently: Place 0.4 mg under the tongue every 5 (five) minutes as needed for chest pain. Chest pain 05/09/20  Yes [name], Alinda Sierras, MD  ondansetron (ZOFRAN) 4 MG tablet Take one tablet by mouth ever 8 hours as needed for nausea. Patient taking differently: Take 4 mg by mouth every 8 (eight) hours as  needed for nausea. 09/20/20  Yes [name], Alinda Sierras, MD  pantoprazole (PROTONIX) 40 MG tablet TAKE 1 TABLET BY MOUTH EVERY DAY Patient taking differently: Take 40 mg by mouth daily. 06/30/20  Yes  [name], Alinda Sierras, MD  polyethylene glycol (MIRALAX / GLYCOLAX) 17 g packet Take 17 g by mouth daily as needed for mild constipation.    Yes [provider]  potassium chloride SA (KLOR-CON M20) 20 MEQ tablet Take 1 tablet (20 mEq total) by mouth daily. 12/15/19  Yes [name], Alinda Sierras, MD  pramipexole (MIRAPEX) 1.5 MG tablet TAKE 2 TABLETS BY MOUTH EVERY EVENING Patient taking differently: Take 3 mg by mouth at bedtime. 09/22/20  Yes [name], Alinda Sierras, MD  warfarin (COUMADIN) 5 MG tablet Take 1 tablet daily or Take as directed by anticoagulation clinic Patient taking differently: Take 5 mg by mouth See admin instructions. Takes 2.5 mg on Thursday  and Monday and 5 mg on all other days 06/08/20  Yes [name], Alinda Sierras, MD  glucose blood (ACCU-CHEK AVIVA PLUS) test strip Test 2 times a day. Patient not taking: Reported on 09/14/2020 08/14/20   [name], MD  metoprolol succinate (TOPROL-XL) 25 MG 24 hr tablet TAKE 1 TABLET BY MOUTH EVERY DAY Patient not taking: Reported on 09/28/2020 02/03/20   Burnell Blanks, MD    Physical Exam: Vitals:   09/28/20 1700 09/28/20 1800 09/28/20 1830 09/28/20 1912  BP: (!) 160/92 (!) 157/87 (!) 163/87 (!) 162/105  Pulse: 90 91 92 91  Resp: 17 16 16 16   Temp:      TempSrc:      SpO2: 98% 98% 98% 98%    Constitutional: NAD, calm, comfortable, fatigue elderly appearing gentleman laying flat in bed with ice pack behind his neck. Vitals:   09/28/20 1700 09/28/20 1800 09/28/20 1830 09/28/20 1912  BP: (!) 160/92 (!) 157/87 (!) 163/87 (!) 162/105  Pulse: 90 91 92 91  Resp: 17 16 16 16   Temp:      TempSrc:      SpO2: 98% 98% 98% 98%   Eyes: PERRL, lids and conjunctivae normal ENMT: Mucous membranes are moist.  Neck: normal, supple, no masses, no  thyromegaly Respiratory: clear to auscultation bilaterally, no wheezing, no crackles. Normal respiratory effort. No accessory muscle use.  Cardiovascular: Regular rate and rhythm, no murmurs / rubs / gallops. No extremity edema.  Abdomen: no tenderness, no masses palpated. Bowel sounds positive.  Musculoskeletal: no clubbing / cyanosis. No joint deformity upper and lower extremities. Good ROM, no contractures. Normal muscle tone.  Skin: no rashes, lesions, ulcers. No induration Neurologic: CN 2-12 grossly intact.  No facial asymmetry.  Visible right-sided nystagmus throughout exam.  No past finger pointing.  No active diplopia.  Sensation intact. Strength 5/5 in all 4.  Intact heel-to-shin. Psychiatric: Normal judgment and insight. Alert and oriented x 3. Normal mood.     Labs on Admission: I have personally reviewed following labs and imaging studies  CBC: Recent Labs  Lab 09/24/20 1841 09/28/20 0935  WBC 4.9 5.2  NEUTROABS 3.6 3.5  HGB 11.5* 10.7*  HCT 34.9* 33.2*  MCV 95.4 98.2  PLT 122* 941*   Basic Metabolic Panel: Recent Labs  Lab 09/24/20 1841 09/28/20 0935  NA 137 135  K 4.4 4.3  CL 105 102  CO2 22 22  GLUCOSE 110* 125*  BUN 25* 37*  CREATININE 2.19* 2.62*  CALCIUM 9.6 9.3   GFR: CrCl cannot be calculated (Unknown ideal weight.). Liver Function Tests: Recent Labs  Lab 09/28/20 0935  AST 14*  ALT 7  ALKPHOS 86  BILITOT 0.7  PROT 6.7  ALBUMIN 3.7   Recent Labs  Lab 09/28/20 0935  LIPASE 51  No results for input(s): AMMONIA in the last 168 hours. Coagulation Profile: Recent Labs  Lab 09/26/20 0000 09/28/20 1532  INR 5.8* 4.6*   Cardiac Enzymes: No results for input(s): CKTOTAL, CKMB, CKMBINDEX, TROPONINI in the last 168 hours. BNP (last 3 results) No results for input(s): PROBNP in the last 8760 hours. HbA1C: No results for input(s): HGBA1C in the last 72 hours. CBG: No results for input(s): GLUCAP in the last 168 hours. Lipid  Profile: No results for input(s): CHOL, HDL, LDLCALC, TRIG, CHOLHDL, LDLDIRECT in the last 72 hours. Thyroid Function Tests: Recent Labs    09/28/20 0936  TSH 1.771  FREET4 1.13*   Anemia Panel: No results for input(s): VITAMINB12, FOLATE, FERRITIN, TIBC, IRON, RETICCTPCT in the last 72 hours. Urine analysis:    Component Value Date/Time   COLORURINE YELLOW 09/28/2020 1230   APPEARANCEUR CLEAR 09/28/2020 1230   LABSPEC <1.005 (L) 09/28/2020 1230   PHURINE 6.0 09/28/2020 1230   GLUCOSEU NEGATIVE 09/28/2020 1230   HGBUR LARGE (A) 09/28/2020 1230   HGBUR trace-intact 12/29/2006 0827   BILIRUBINUR NEGATIVE 09/28/2020 1230   KETONESUR NEGATIVE 09/28/2020 1230   PROTEINUR 30 (A) 09/28/2020 1230   UROBILINOGEN 0.2 12/29/2006 0827   NITRITE NEGATIVE 09/28/2020 1230   LEUKOCYTESUR NEGATIVE 09/28/2020 1230    Radiological Exams on Admission: CT ABDOMEN PELVIS WO CONTRAST  Result Date: 09/28/2020 CLINICAL DATA:  Nausea, vomiting, emesis EXAM: CT ABDOMEN AND PELVIS WITHOUT CONTRAST TECHNIQUE: Multidetector CT imaging of the abdomen and pelvis was performed following the standard protocol without IV contrast. COMPARISON:  07/23/2019 FINDINGS: Lower chest: No acute abnormality. Cardiomegaly. Coronary artery calcifications. Hepatobiliary: No focal liver abnormality is seen. Status post cholecystectomy. No biliary dilatation. Pancreas: Unremarkable. No pancreatic ductal dilatation or surrounding inflammatory changes. Spleen: Normal in size without significant abnormality. Adrenals/Urinary Tract: Adrenal glands are unremarkable. Nine bilateral renal cysts, including a high attenuation hemorrhagic or proteinaceous cyst of the posterior midportion of the right kidney. Kidneys are otherwise normal, without renal calculi, solid lesion, or hydronephrosis. Suprapubic catheter. Stomach/Bowel: Stomach is within normal limits. Appendix appears normal. No evidence of bowel wall thickening, distention, or  inflammatory changes. Vascular/Lymphatic: Aortic atherosclerosis. No enlarged abdominal or pelvic lymph nodes. Reproductive: Prostatomegaly. Other: No abdominal wall hernia or abnormality. No abdominopelvic ascites. Musculoskeletal: No acute or significant osseous findings. IMPRESSION: 1. No acute noncontrast CT findings of the abdomen or pelvis to explain nausea or vomiting. 2. Suprapubic catheter. 3. Prostatomegaly. 4. Coronary artery disease. Aortic Atherosclerosis (ICD10-I70.0). Electronically Signed   By: Eddie Candle M.D.   On: 09/28/2020 11:14   MR BRAIN WO CONTRAST  Result Date: 09/28/2020 CLINICAL DATA:  Stroke, follow up EXAM: MRI HEAD WITHOUT CONTRAST TECHNIQUE: Multiplanar, multiecho pulse sequences of the brain and surrounding structures were obtained without intravenous contrast. COMPARISON:  Most recent MRI is from 2013 FINDINGS: Brain: There is no acute infarction or intracranial hemorrhage. There is no intracranial mass or significant mass effect. There is no hydrocephalus or extra-axial fluid collection. There is abnormal T2 hyperintensity involving the dorsal midbrain, pons, and medulla. Possible slight effacement of cerebral aqueduct focally. Additional patchy and confluent areas of T2 hyperintensity in the supratentorial white matter are nonspecific but probably reflect chronic microvascular ischemic changes. Prominence of the ventricles and sulci reflects parenchymal volume loss. Vascular: Major vessel flow voids at the skull base are preserved. Skull and upper cervical spine: Normal marrow signal is preserved. Sinuses/Orbits: Minor mucosal thickening. Bilateral lens replacements. Other: Sella is unremarkable.  Mastoid air cells are  clear. IMPRESSION: Abnormal signal involving the dorsal brainstem with possible minor mass effect. The location accounts for reported intractable vomiting. Differential is wide and includes toxic/metabolic, infectious/post infectious, demyelinating/inflammatory,  and neoplastic etiologies. Postcontrast imaging is recommended. Electronically Signed   By: Macy Mis M.D.   On: 09/28/2020 15:09      Assessment/Plan 87 year old male with history of relapsed non-Hodgkin's lymphoma of the testes in remission who presents with occipital headache, intractable nausea and vomiting, right sided nystagmus with findings of abnormal signal seen on dorsal brainstem on MRI.   Intractable nausea and vomiting/headache/diplopia - abnormal signal seen on dorsal brainstem in MRI brain wo contrast -Differential is broad including metabolic, infectious/postinfectious, demyelinating and neoplastic etiology.Possibly could be paraneoplastic encephalitis given hx of non-Hodgkin's lymphoma.  No leukocytosis or fever to suggest any infectious cause. - Unfortunately, MRI brain with contrast cannot be obtained due to worsening renal insufficiency.  LP also needs to be postponed due to supratherapeutic INR of 4.6 -Neurology will see in consultation once he arrives at [facility].  Appreciate further recommendations. 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 Marin Olp   Paroxysmal atrial fibrillation/supratherapeutic INR -INR elevated at 4.6. Goal of 2-3 -Warfarin needs to be held in order to obtain LP  HTN -Controlled.  Hold Lasix for now due to AKI  Type 2 DM  -controlled  Hypothyroidism - Continue levothyroxine  Peripheral neuropathy - Continue gabapentin and vitamin B12  DVT prophylaxis:.Coumadin held due to supratherapeutic INR  code Status: Full Family Communication: Plan discussed with patient and daughter at bedside  disposition Plan: Home with at least 2 midnight stays  Consults called: Neurology Admission status:  inpatient  Level of care: Progressive  Status is: Inpatient  Remains inpatient appropriate because:Inpatient level of care appropriate due to severity of illness  Dispo: The patient is from: Home              Anticipated d/c is to: Home              Patient currently is not medically stable to d/c.   Difficult to place patient No         Orene Desanctis DO [facility]   If 7PM-7AM, please contact night-coverage [email or web address]   09/28/2020, 8:09 PM

## 2020-09-28 NOTE — ED Notes (Signed)
Admitting provider at bedside.

## 2020-09-28 NOTE — Telephone Encounter (Signed)
Message received from patient's daughter Lattie Haw to inform Dr. Marin Olp that pt is currently in the ER at [facility]. Dr. Marin Olp notified.

## 2020-09-28 NOTE — ED Notes (Signed)
Patient transported to CT 

## 2020-09-28 NOTE — ED Triage Notes (Signed)
Pt bib [facility] from home d/t emesis.  Pt to have CT scan done today however began vomiting at 0445 and could not tolerate contrast. Pt has been dealing w/ nausea x 1 month.  Seen Saturday for similar c/o.

## 2020-09-28 NOTE — ED Provider Notes (Signed)
[facility] DEPT Provider Note   CSN: [id number] Arrival date & time: 09/28/20  8657     History Chief Complaint  Patient presents with   Emesis    Gary Yates. is a 87 y.o. male with a past medical history listed below presented to the ED with chief complaint of vomiting.  Patient states that vomiting began 3 weeks ago.  He states that he has about up to max of 15 bouts of nonbilious nonbloody vomiting per day.  He states that around the same time the vomiting began 3 weeks ago he tried a new seafood restaurant where he had baked potato, flounder fish, and shrimp.  Since then the vomiting episodes began.  He also reports nausea, fatigue, dizziness, lightheadedness and diffuse headache.  He denies abdominal pain, constipation, diarrhea or syncope.  Patient states that after eating he will begin vomiting an hour to an hour and a half later.  However, he is able to hold down fluids intermittently.  Last episode of vomiting was early this morning.  Patient last presented to the ED 09/24/2020 for similar symptoms.  Patient was given Zofran for nausea/vomiting relief.  A CT was obtained that showed no acute intracranial abnormalities.   A few weeks ago during oncology appointment patient was found to have UTI on UA and was prescribed Cipro. Patient did not tolerate medication and experienced persistent vomiting.  Cipro was discontinued and patient was prescribed nitrofurantoin.  Patient reports taking nitrofurantoin intermittently and last dose Sunday, 09/24/2020.  He states that his PCP told him to stop taking the medication because patients with Foley dependency tend to have abnormal UAs.     Emesis Associated symptoms: headaches   Associated symptoms: no abdominal pain, no arthralgias, no chills, no cough, no diarrhea, no fever and no myalgias       Past Medical History:  Diagnosis Date   Anemia in chronic renal disease 05/07/2017   Anxiety    Atrial  fibrillation (HCC)    COPD (chronic obstructive pulmonary disease) ([place])    pt. denies   Coronary artery disease    a. h/o Overlapping stents RCA;  b. 06/2011 Cath: patent stents, nonobs dzs, NL EF.   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   Dyspnea    with exertion   Dysrhythmia    GERD (gastroesophageal reflux disease)    Headache    History of bronchitis    History of kidney stones    History of radiation therapy 02/19/16 - 03/13/16   Testis/Scrotum: 32.4 Gy in 18 fractions   History of radiation therapy 08/07/16-08/20/16   left adrenal gland mass treated to 30 Gy in 10 fractions   Hyperlipidemia    Hypertension    Iron deficiency anemia due to chronic blood loss 08/08/2017   Low testosterone    Nephrolithiasis    OSA (obstructive sleep apnea) 11/26/2017   Osteoarthritis    shoulder   Restless leg    SVT (supraventricular tachycardia) (HCC)    Urinary frequency    Wears partial dentures    upper and lower    Patient Active Problem List   Diagnosis Date Noted   COPD (chronic obstructive pulmonary disease) ([place])    Hypercoagulable state due to atrial fibrillation ([place]) 08/13/2019   Suprapubic catheter ([place]) 08/13/2019   History of non-Hodgkin's lymphoma 08/13/2019   Cardiac arrhythmia  08/07/2019   Aortic atherosclerosis ([place]) 08/07/2019   OSA (obstructive sleep apnea) 11/26/2017   Iron deficiency anemia due to chronic blood loss 08/08/2017   Sepsis secondary to UTI ([place]) 06/17/2017   Thrombocytopenia ([place]) 06/17/2017   Urinary retention 06/17/2017   Anemia in chronic renal disease 05/07/2017   V-tach ([place]) 07/02/2016   Protein-calorie malnutrition, severe 07/02/2016   Bilateral lower extremity edema 07/01/2016   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   Lumbosacral spondylosis with radiculopathy 02/07/2015   At high risk for falls  05/05/2014   Encounter for therapeutic drug monitoring 03/15/2013   Chest pain 02/03/2013   Obesity (BMI 30-39.9) 09/21/2012   Type 2 diabetes mellitus with diabetic neuropathy, unspecified ([place]) 07/03/2012   Chronic kidney disease, stage III (moderate) ([place]) 07/03/2012   Restless leg syndrome 12/04/2011   Long term current use of anticoagulant therapy 07/15/2011   Paroxysmal atrial fibrillation ([place]) 06/21/2011   Upper extremity weakness 06/21/2011   Diabetic peripheral neuropathy ([place]) 12/17/2010   Hypotestosteronism 05/07/2010   LEG CRAMPS, IDIOPATHIC 08/14/2009   Insomnia 08/14/2009   Malignant neoplasm of skin of face 08/14/2009   OBESITY 08/02/2009   HYPERTENSION, HEART CONTROLLED W/O ASSOC CHF 03/28/2008   G E R D 01/12/2007   Morbid obesity ([place]) 09/15/2006   Hyperlipidemia, mixed 07/01/2006   Essential hypertension 07/01/2006   Coronary atherosclerosis 07/01/2006   OSTEOARTHRITIS 07/01/2006    Past Surgical History:  Procedure Laterality Date   [place]  01/2013   CATARACT EXTRACTION, BILATERAL     CHOLECYSTECTOMY     COLONOSCOPY     CORONARY ANGIOPLASTY  2004   CYSTOSCOPY N/A 08/18/2017   Procedure: CYSTOSCOPY WITH FULGURATION AND SUPRA PUBIC TUBE PLACEMENT;  Surgeon: Kathie Rhodes, MD;  Location: [facility];  Service: Urology;  Laterality: N/A;   EYE SURGERY Bilateral    cataracts   IR GENERIC HISTORICAL  10/05/2015   IR US GUIDE VASC ACCESS RIGHT 10/05/2015 Marybelle Killings, MD [facility]   IR GENERIC HISTORICAL  10/05/2015   IR FLUORO GUIDE PORT INSERTION RIGHT 10/05/2015 Marybelle Killings, MD [facility]   LEFT HEART CATHETERIZATION WITH CORONARY ANGIOGRAM N/A 06/18/2011   Procedure: LEFT HEART CATHETERIZATION WITH CORONARY ANGIOGRAM;  Surgeon: Peter M Martinique, MD;  Location: [facility] CATH LAB;  Service: Cardiovascular;  Laterality: N/A;   LEFT HEART CATHETERIZATION WITH CORONARY ANGIOGRAM N/A 01/27/2013   Procedure: LEFT  HEART CATHETERIZATION WITH CORONARY ANGIOGRAM;  Surgeon: Burnell Blanks, MD;  Location: [facility] CATH LAB;  Service: Cardiovascular;  Laterality: N/A;   LUMBAR LAMINECTOMY/DECOMPRESSION MICRODISCECTOMY N/A 02/07/2015   Procedure: Lumbar three-Sacral one Decompression;  Surgeon: Kevan Ny Ditty, MD;  Location: [facility];  Service: Neurosurgery;  Laterality: N/A;  L3 to S1 Decompression   MULTIPLE TOOTH EXTRACTIONS     ORCHIECTOMY Right 09/01/2015   Procedure: RIGHT ORCHIECTOMY;  Surgeon: Kathie Rhodes, MD;  Location: [facility];  Service: Urology;  Laterality: Right;   port a cath in place      ROTATOR CUFF REPAIR Left        Family History  Problem Relation Age of Onset   Alzheimer's disease Mother    Heart disease Mother    Heart disease Father    Migraines Father    Ulcers Father    Prostate cancer Brother    Coronary artery disease Other        Male 1st degree relative <50   Coronary  artery disease Other        male 1st degree relative <60   Heart disease Sister    Obesity Sister        Morbid   Arthritis Sister    Heart disease Brother    Arthritis Brother    Sleep apnea Son    Obesity Son    Migraines Daughter    Thyroid disease Daughter     Social History   Tobacco Use   Smoking status: Former    Packs/day: 1.50    Years: 20.00    Pack years: 30.00    Types: Cigarettes    Quit date: 04/05/1978    Years since quitting: 42.5   Smokeless tobacco: Never  Vaping Use   Vaping Use: Never used  Substance Use Topics   Alcohol use: No   Drug use: No    Home Medications Prior to Admission medications   Medication Sig Start Date End Date Taking? Authorizing Provider  acetaminophen (TYLENOL) 500 MG tablet Take 1,000 mg by mouth every 6 (six) hours as needed (for pain/fever/headaches.).    Yes [provider]  atorvastatin (LIPITOR) 40 MG tablet TAKE 1 TABLET BY MOUTH EVERY DAY Patient taking differently: Take 40 mg by mouth daily. 06/30/20  Yes  [name], Alinda Sierras, MD  chlorhexidine (PERIDEX) 0.12 % solution Use as directed 15 mLs in the mouth or throat 2 (two) times daily as needed (mouth pain). 12/07/19  Yes [provider]  escitalopram (LEXAPRO) 10 MG tablet TAKE 1/2 TABLET ONCE DAILY FOR ONE WEEK AND THEN INCREASE TO ONE DAILY. Patient taking differently: Take 10 mg by mouth daily. 09/18/20  Yes [name], Alinda Sierras, MD  gabapentin (NEURONTIN) 300 MG capsule TAKE 2 CAPSULES BY MOUTH 3 TIMES A DAY Patient taking differently: 3 two times a day 06/08/20  Yes [name], Alinda Sierras, MD  ibuprofen (ADVIL) 200 MG tablet Take 600 mg by mouth every 3 (three) hours as needed for moderate pain.   Yes [provider]  levothyroxine (SYNTHROID) 25 MCG tablet TAKE 1 TABLET BY MOUTH EVERY DAY Patient taking differently: Take 25 mcg by mouth daily. 08/23/20  Yes [name], Alinda Sierras, MD  metFORMIN (GLUCOPHAGE) 500 MG tablet Take 1 tablet (500 mg total) by mouth 2 (two) times daily with a meal. Patient taking differently: Take 500 mg by mouth at bedtime. 01/13/20  Yes [name], Alinda Sierras, MD  nitroGLYCERIN (NITROSTAT) 0.4 MG SL tablet Place 1 tablet (0.4 mg total) under the tongue every 5 (five) minutes as needed. Chest pain 05/09/20  Yes [name], Alinda Sierras, MD  potassium chloride SA (KLOR-CON M20) 20 MEQ tablet Take 1 tablet (20 mEq total) by mouth daily. 12/15/19  Yes [name], Alinda Sierras, MD  glucose blood (ACCU-CHEK AVIVA PLUS) test strip Test 2 times a day. Patient not taking: Reported on 09/14/2020 08/14/20   [name], MD  metoprolol succinate (TOPROL-XL) 25 MG 24 hr tablet TAKE 1 TABLET BY MOUTH EVERY DAY Patient taking differently: Take 25 mg by mouth daily. 02/03/20   Burnell Blanks, MD  ondansetron (ZOFRAN) 4 MG tablet Take one tablet by mouth ever 8 hours as needed for nausea. Patient taking differently: Take 4 mg by mouth every 8 (eight) hours as needed for nausea. 09/20/20   [name], Alinda Sierras, MD  pantoprazole (PROTONIX) 40  MG tablet TAKE 1 TABLET BY MOUTH EVERY DAY Patient taking differently: Take 40 mg by mouth daily. 06/30/20   [name], Alinda Sierras, MD  polyethylene glycol (  MIRALAX / GLYCOLAX) 17 g packet Take 17 g by mouth daily as needed for mild constipation.     [provider]  pramipexole (MIRAPEX) 1.5 MG tablet TAKE 2 TABLETS BY MOUTH EVERY EVENING Patient taking differently: Take 3 mg by mouth at bedtime. 09/22/20   [name], Alinda Sierras, MD  warfarin (COUMADIN) 5 MG tablet Take 1 tablet daily or Take as directed by anticoagulation clinic Patient taking differently: Take 5 mg by mouth daily. 06/08/20   [name], Alinda Sierras, MD    Allergies    Latex, Ace inhibitors, Codeine, and Penicillins  Review of Systems   Review of Systems  Constitutional:  Positive for appetite change and fatigue. Negative for chills and fever.  HENT:  Negative for sinus pressure and sinus pain.   Eyes:  Positive for visual disturbance.  Respiratory:  Negative for cough and shortness of breath.   Cardiovascular:  Negative for chest pain.  Gastrointestinal:  Positive for nausea and vomiting. Negative for abdominal pain, constipation and diarrhea.  Musculoskeletal:  Negative for arthralgias and myalgias.  Neurological:  Positive for dizziness, light-headedness and headaches. Negative for syncope.   Physical Exam Updated Vital Signs BP (!) 152/84   Pulse 98   Temp 97.9 F (36.6 C) (Oral)   Resp 16   SpO2 100%   Physical Exam Constitutional:      Appearance: Normal appearance.     Comments: Patient appears fatigue and malaise  HENT:     Head: Normocephalic and atraumatic.  Eyes:     Conjunctiva/sclera: Conjunctivae normal.  Cardiovascular:     Rate and Rhythm: Normal rate.     Heart sounds: Heart sounds are distant.  Pulmonary:     Effort: Pulmonary effort is normal.     Breath sounds: Normal air entry. Decreased breath sounds present.  Chest:     Comments: Port located on right upper chest Abdominal:      General: Abdomen is flat. Bowel sounds are normal.     Palpations: Abdomen is soft.     Tenderness: There is no abdominal tenderness.  Musculoskeletal:     Right lower leg: 1+ Edema present.     Left lower leg: 1+ Edema present.     Comments: Patient has compression socks on bilateral legs  Lymphadenopathy:     Cervical:     Right cervical: No superficial cervical adenopathy.    Left cervical: No superficial cervical adenopathy.  Skin:    General: Skin is warm and dry.  Neurological:     General: No focal deficit present.     Mental Status: He is alert and easily aroused.  Psychiatric:        Attention and Perception: Attention normal.        Behavior: Behavior normal. Behavior is cooperative.    ED Results / Procedures / Treatments   Labs (all labs ordered are listed, but only abnormal results are displayed) Labs Reviewed  CBC WITH DIFFERENTIAL/PLATELET - Abnormal; Notable for the following components:      Result Value   RBC 3.38 (*)    Hemoglobin 10.7 (*)    HCT 33.2 (*)    RDW 16.5 (*)    Platelets 119 (*)    All other components within normal limits  COMPREHENSIVE METABOLIC PANEL - Abnormal; Notable for the following components:   Glucose, Bld 125 (*)    BUN 37 (*)    Creatinine, Ser 2.62 (*)    AST 14 (*)    GFR, Estimated 23 (*)  All other components within normal limits  TROPONIN I (HIGH SENSITIVITY) - Abnormal; Notable for the following components:   Troponin I (High Sensitivity) 30 (*)    All other components within normal limits  RESP PANEL BY RT-PCR (FLU A&B, COVID) ARPGX2  TSH  LIPASE, BLOOD  T4, FREE  URINALYSIS, ROUTINE W REFLEX MICROSCOPIC  TROPONIN I (HIGH SENSITIVITY)    EKG EKG Interpretation  Date/Time:  Thursday September 28 2020 09:42:13 EDT Ventricular Rate:  80 PR Interval:  202 QRS Duration: 162 QT Interval:  426 QTC Calculation: 492 R Axis:   -88 Text Interpretation: Sinus rhythm RBBB and LAFB No significant change since last  tracing Confirmed by Gareth Morgan [phone]) on 09/28/2020 10:38:16 AM  Radiology CT ABDOMEN PELVIS WO CONTRAST  Result Date: 09/28/2020 CLINICAL DATA:  Nausea, vomiting, emesis EXAM: CT ABDOMEN AND PELVIS WITHOUT CONTRAST TECHNIQUE: Multidetector CT imaging of the abdomen and pelvis was performed following the standard protocol without IV contrast. COMPARISON:  07/23/2019 FINDINGS: Lower chest: No acute abnormality. Cardiomegaly. Coronary artery calcifications. Hepatobiliary: No focal liver abnormality is seen. Status post cholecystectomy. No biliary dilatation. Pancreas: Unremarkable. No pancreatic ductal dilatation or surrounding inflammatory changes. Spleen: Normal in size without significant abnormality. Adrenals/Urinary Tract: Adrenal glands are unremarkable. Nine bilateral renal cysts, including a high attenuation hemorrhagic or proteinaceous cyst of the posterior midportion of the right kidney. Kidneys are otherwise normal, without renal calculi, solid lesion, or hydronephrosis. Suprapubic catheter. Stomach/Bowel: Stomach is within normal limits. Appendix appears normal. No evidence of bowel wall thickening, distention, or inflammatory changes. Vascular/Lymphatic: Aortic atherosclerosis. No enlarged abdominal or pelvic lymph nodes. Reproductive: Prostatomegaly. Other: No abdominal wall hernia or abnormality. No abdominopelvic ascites. Musculoskeletal: No acute or significant osseous findings. IMPRESSION: 1. No acute noncontrast CT findings of the abdomen or pelvis to explain nausea or vomiting. 2. Suprapubic catheter. 3. Prostatomegaly. 4. Coronary artery disease. Aortic Atherosclerosis (ICD10-I70.0). Electronically Signed   By: Eddie Candle M.D.   On: 09/28/2020 11:14    Procedures Procedures   Medications Ordered in ED Medications  ondansetron (ZOFRAN) injection 4 mg (has no administration in time range)  prochlorperazine (COMPAZINE) injection 5 mg (5 mg Intravenous Given 09/28/20 1047)   lactated ringers bolus 1,000 mL (0 mLs Intravenous Stopped 09/28/20 1047)  diphenhydrAMINE (BENADRYL) injection 12.5 mg (12.5 mg Intravenous Given 09/28/20 1046)    ED Course  I have reviewed the triage vital signs and the nursing notes.  Pertinent labs & imaging results that were available during my care of the patient were reviewed by me and considered in my medical decision making (see chart for details).    MDM Rules/Calculators/A&P                           Intractable Vomiting Patient has been experiencing persistent vomiting for the last 3 weeks.  Patient has associated nausea, headache, lightheadedness, dizziness, and anorexia.  Patient also reports decreased fluid intake.  Patient was recently in the ED for similar symptoms.  Zofran will temporarily relieve nausea vomiting.  CT of the head was obtained on previous ED visit that showed no intracranial abnormalities.  CT of the abdomen and pelvis no acute findings; suprapubic catheter present;  and prostatomegaly.  Patient also complaining of diplopia. MRI of the brain-pending Final Clinical Impression(s) / ED Diagnoses Final diagnoses:  None    Rx / DC Orders ED Discharge Orders     None        ,  Enid Derry, MD 09/28/20 1151    Gareth Morgan, MD 09/29/20 [phone]

## 2020-09-28 NOTE — Consult Note (Signed)
Neurology Consultation Reason for Consult: Intractable nausea and vomiting Requesting Physician: Ileene Musa  CC: Headache, nausea and vomitting  History is obtained from: Patient, son at bedside and chart review  HPI: Authur Yates. is a 87 y.o. 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  He presents with a subacute decline.  Of note his wife, passed away in 2020/04/06.  He struggled quite a bit with eating well at that point and was losing weight, initially attributed to improvements in edema from his heart failure. In May 2022 he was noted to be eating well having gained some weight and not complaining of headache.  However on follow-up in September he was noted to be generally unwell with weight loss and headache.  Since then he has continued to decline.  He notes he has been having daily retching episodes even when his stomach is empty, dry heaving up to 15 times a day.  He feels that his headaches are worse when it is time to eat, though other notes describe his headache being worse in the morning when he wakes up he does not clearly endorse this for me.  He does note he has had intermittent double vision that is vertical and family has been concerned about shuffling gait and increasing falls although his strength has remained.  They note his nystagmus started at least a month ago and his right facial droop has been ongoing for at least a couple of weeks.  They also report his hearing has dramatically declined recently and they have to talk to him much louder than they used to in the last few weeks.  All of these symptoms have been quite subtle in onset, and notably the patient seems to  generally minimize or not be able to remember symptoms compared to the reports of his family members.  Regarding his functional baseline, she was healthy driving for 2 to 3 years due to neuropathy in his feet making it difficult for him to feel the pedals properly medications for 3 to 4 years.  However they have only been concerned about him in the last month or so and noted he is always been mentally sharp up until recently.  Regarding his oncological history, he is followed by Dr. Marin Olp.  He was diagnosed in Apr 07, 2015, initially treated with 4 cycles of R-CHOP as well as radiation, and then treated with Rituxan/Bendamustine/Velcade, further radiation therapy and R-ICE, dose reduced, completed on 04/04/2017. His last PET CT scan was 12/03/2017 and was negative for residual or recurrent lymphoma   ROS: All other review of systems was negative except as noted in the HPI.   Past Medical History:  Diagnosis Date   Anemia in chronic renal disease 05/07/2017   Anxiety    Atrial fibrillation (HCC)    COPD (chronic obstructive pulmonary disease) ([place])    pt. denies   Coronary artery disease    a. h/o Overlapping stents RCA;  b. 06/2011 Cath: patent stents, nonobs dzs, NL EF.   Diabetic peripheral neuropathy (HCC)    Diffuse non-Hodgkin's lymphoma of testis ([place]) 09/28/2015   DM (diabetes mellitus) ([place])    Type 2, peripheral neuropathy.   Dyspnea    with exertion   Dysrhythmia    GERD (gastroesophageal reflux disease)  Headache    History of bronchitis    History of kidney stones    History of radiation therapy 02/19/16 - 03/13/16   Testis/Scrotum: 32.4 Gy in 18 fractions   History of radiation therapy 08/07/16-08/20/16   left adrenal gland mass treated to 30 Gy in 10 fractions   Hyperlipidemia    Hypertension    Iron deficiency anemia due to chronic blood loss 08/08/2017   Low testosterone    Nephrolithiasis    OSA (obstructive sleep apnea) 11/26/2017   Osteoarthritis    shoulder   Restless leg     SVT (supraventricular tachycardia) (HCC)    Urinary frequency    Wears partial dentures    upper and lower    Family History  Problem Relation Age of Onset   Alzheimer's disease Mother    Heart disease Mother    Heart disease Father    Migraines Father    Ulcers Father    Prostate cancer Brother    Coronary artery disease Other        Male 1st degree relative <50   Coronary artery disease Other        male 1st degree relative <60   Heart disease Sister    Obesity Sister        Morbid   Arthritis Sister    Heart disease Brother    Arthritis Brother    Sleep apnea Son    Obesity Son    Migraines Daughter    Thyroid disease Daughter     Social History:  reports that he quit smoking about 42 years ago. His smoking use included cigarettes. He has a 30.00 pack-year smoking history. He has never used smokeless tobacco. He reports that he does not drink alcohol and does not use drugs.  Exam: Current vital signs: BP (!) 162/105   Pulse 91   Temp 97.9 F (36.6 C) (Oral)   Resp 16   SpO2 98%  Vital signs in last 24 hours: Temp:  [97.9 F (36.6 C)] 97.9 F (36.6 C) (09/22 0817) Pulse Rate:  [41-104] 91 (09/22 1912) Resp:  [10-19] 16 (09/22 1912) BP: (118-164)/(62-105) 162/105 (09/22 1912) SpO2:  [86 %-100 %] 98 % (09/22 1912)   Physical Exam  Constitutional: Appears well-developed and well-nourished.  Much thinner than in his EPIC picture Psych: Affect appropriate to situation, calm and cooperative, pleasant Eyes: No scleral injection HENT: No oropharyngeal obstruction.  MSK: no joint deformities.  Cardiovascular: Normal rate and regular rhythm.  Respiratory: Effort normal, non-labored breathing GI: Soft.  No distension. There is no tenderness.  Skin: Warm dry and intact visible skin, there is some bruising on his right knee  Neuro: Mental Status: Patient is awake, alert, oriented to person, place, month, year, and situation. Patient is able to give a clear  and coherent history overall, although his family does correct to any details and mention many symptoms which he is unable to recall No signs of aphasia or neglect Cranial Nerves: II: Visual Fields are full. Pupils are equal, round, and reactive to light.   III,IV, VI: On eye closure followed by opening there is clear left lateropulsion followed by right beating saccades.  However there is also direction changing nystagmus V: Facial sensation is symmetric to temperature VII: Facial movement is notable for right facial droop new within the last few weeks per family.  VIII: hearing is i very hard of hearing, markedly worsened in the past few weeks per family X: Uvula elevates symmetrically XI:  Shoulder shrug is symmetric. XII: tongue is midline without atrophy or fasciculations.  Questionable palatal tremor Motor: Tone is normal. Bulk is normal. 5/5 strength was present in all four extremities.  At times there is a slight rest tremor in the left upper extremity though this is very intermittent Sensory: Sensation is symmetric to light touch and temperature in the arms and legs.  There is length2 dependent loss of temperature sensation consistent with his known neuropathy Deep Tendon Reflexes:  3+ and symmetric in the brachioradialis and 2+ patellae.  Plantars: Toes are mute bilaterally.  Cerebellar: FNF is notable for mild tremor in end reach and HKS is intact bilaterally though limited on the right leg secondary to urinary catheter in place   I have reviewed labs in epic and the results pertinent to this consultation are:  Creatinine on admission 2.62 (baseline 1.98-2.19), GFR 23 INR 4.6 TSH 1.77, free T4 1.1 Hemoglobin 10.7 (at his baseline), with increased RDW at 16.5 and low platelets 119 (previously normal platelet count)  I have reviewed the images obtained:  MRI brain personally reviewed, agree with radiology: "Abnormal signal involving the dorsal brainstem with possible minor  mass effect. The location accounts for reported intractable vomiting. Differential is wide and includes toxic/metabolic, infectious/post infectious, demyelinating/inflammatory, and neoplastic etiologies. Postcontrast imaging is recommended."   Impression: Overall, this patient's clinical course is highly concerning for development of multiple nutritional deficiencies given his dramatic weight loss and poor tolerance of oral intake for weeks to months.  However nutritional deficiencies are insufficient to explain the severe headaches he is complaining of, and in combination with the gait dysfunction that is described as well as the confusion this is concerning for potential increased intracranial pressure potentially due to intracranial malignancy.  Therefore he will require lumbar puncture for further evaluation.  Given his malignancy history of the lumbar puncture should include flow and cytopathology, which would need to be done on a weekday, and additionally his INR is high currently, likely in the setting of vitamin K deficiency in his diet.  Given atrial fibrillation and the potential for hypercoagulability in the setting of malignancy, would avoid reversal with vitamin K, but allow his INR to gradually normalize with nutritional support and holding warfarin  Recommendations: -Lumbar puncture, with opening pressure, cell counts, flow and cytopathology, glucose, protein, bacterial culture, fungal culture, and additional CSF saved for potential additional tests pending preliminary tests as well as MRI brain with contrast -MRI brain with contrast if/when renal function improves to GFR greater than 30 (ordered) -Nutritional labs to include thiamine, B12, B6, MMA, copper, zinc, folate  -B12 resulted at 1214, continue current dose -Empiric thiamine supplementation 500 mg every 8 hours x3 days, followed by 100 mg IV daily, transition to 100 mg p.o. on discharge (ordered) -Additional serum testing: NMO  antibodies  (ordered) -folate, multivitamin (not ordered) -Nutrition consult -Neurology will continue to follow  [place] [phone]

## 2020-09-28 NOTE — ED Notes (Signed)
Patient transported to MRI 

## 2020-09-29 ENCOUNTER — Encounter (HOSPITAL_COMMUNITY): Payer: Self-pay | Admitting: Family Medicine

## 2020-09-29 ENCOUNTER — Inpatient Hospital Stay (HOSPITAL_COMMUNITY): Payer: Medicare Other

## 2020-09-29 ENCOUNTER — Telehealth: Payer: Self-pay | Admitting: *Deleted

## 2020-09-29 ENCOUNTER — Telehealth: Payer: Self-pay | Admitting: Family Medicine

## 2020-09-29 DIAGNOSIS — N170 Acute kidney failure with tubular necrosis: Secondary | ICD-10-CM | POA: Diagnosis not present

## 2020-09-29 DIAGNOSIS — C8589 Other specified types of non-Hodgkin lymphoma, extranodal and solid organ sites: Secondary | ICD-10-CM | POA: Diagnosis not present

## 2020-09-29 DIAGNOSIS — R112 Nausea with vomiting, unspecified: Secondary | ICD-10-CM | POA: Diagnosis not present

## 2020-09-29 DIAGNOSIS — E1142 Type 2 diabetes mellitus with diabetic polyneuropathy: Secondary | ICD-10-CM | POA: Diagnosis not present

## 2020-09-29 DIAGNOSIS — N181 Chronic kidney disease, stage 1: Secondary | ICD-10-CM

## 2020-09-29 LAB — PROTIME-INR
INR: 3 — ABNORMAL HIGH (ref 0.8–1.2)
Prothrombin Time: 31.1 seconds — ABNORMAL HIGH (ref 11.4–15.2)

## 2020-09-29 LAB — CBC
HCT: 34.5 % — ABNORMAL LOW (ref 39.0–52.0)
Hemoglobin: 11.3 g/dL — ABNORMAL LOW (ref 13.0–17.0)
MCH: 31.7 pg (ref 26.0–34.0)
MCHC: 32.8 g/dL (ref 30.0–36.0)
MCV: 96.9 fL (ref 80.0–100.0)
Platelets: 121 10*3/uL — ABNORMAL LOW (ref 150–400)
RBC: 3.56 MIL/uL — ABNORMAL LOW (ref 4.22–5.81)
RDW: 16.2 % — ABNORMAL HIGH (ref 11.5–15.5)
WBC: 6.3 10*3/uL (ref 4.0–10.5)
nRBC: 0 % (ref 0.0–0.2)

## 2020-09-29 LAB — FOLATE: Folate: 3.1 ng/mL — ABNORMAL LOW (ref >5.9)

## 2020-09-29 LAB — BASIC METABOLIC PANEL
Anion gap: 8 (ref 5–15)
BUN: 28 mg/dL — ABNORMAL HIGH (ref 8–23)
CO2: 26 mmol/L (ref 22–32)
Calcium: 9 mg/dL (ref 8.9–10.3)
Chloride: 102 mmol/L (ref 98–111)
Creatinine, Ser: 2.03 mg/dL — ABNORMAL HIGH (ref 0.61–1.24)
GFR, Estimated: 32 mL/min — ABNORMAL LOW (ref >60)
Glucose, Bld: 126 mg/dL — ABNORMAL HIGH (ref 70–99)
Potassium: 4.1 mmol/L (ref 3.5–5.1)
Sodium: 136 mmol/L (ref 135–145)

## 2020-09-29 LAB — CK: Total CK: 38 U/L — ABNORMAL LOW (ref 49–397)

## 2020-09-29 LAB — GLUCOSE, CAPILLARY
Glucose-Capillary: 97 mg/dL (ref 70–99)
Glucose-Capillary: 120 mg/dL — ABNORMAL HIGH (ref 70–99)

## 2020-09-29 MED ORDER — LACTATED RINGERS IV SOLN: INTRAVENOUS | Status: DC

## 2020-09-29 MED ORDER — INSULIN ASPART 100 UNIT/ML IJ SOLN
0.0000 [IU] | Freq: Every day | INTRAMUSCULAR | Status: DC
  Administered 2020-10-03 – 2020-10-05 (×2): 3 [IU] via SUBCUTANEOUS
  Administered 2020-10-06 – 2020-10-08 (×3): 2 [IU] via SUBCUTANEOUS
  Filled 2020-09-29: qty 0.05

## 2020-09-29 MED ORDER — INSULIN ASPART 100 UNIT/ML IJ SOLN
0.0000 [IU] | Freq: Three times a day (TID) | INTRAMUSCULAR | Status: DC
Start: 2020-09-29 — End: 2020-10-10
  Administered 2020-09-30: 1 [IU] via SUBCUTANEOUS
  Administered 2020-10-01: 2 [IU] via SUBCUTANEOUS
  Administered 2020-10-02: 1 [IU] via SUBCUTANEOUS
  Administered 2020-10-02 – 2020-10-03 (×2): 2 [IU] via SUBCUTANEOUS
  Administered 2020-10-03 (×2): 1 [IU] via SUBCUTANEOUS
  Administered 2020-10-04: 7 [IU] via SUBCUTANEOUS
  Administered 2020-10-04: 1 [IU] via SUBCUTANEOUS
  Administered 2020-10-04: 2 [IU] via SUBCUTANEOUS
  Administered 2020-10-05: 5 [IU] via SUBCUTANEOUS
  Administered 2020-10-05: 1 [IU] via SUBCUTANEOUS
  Administered 2020-10-05: 3 [IU] via SUBCUTANEOUS
  Administered 2020-10-06: 7 [IU] via SUBCUTANEOUS
  Administered 2020-10-06: 2 [IU] via SUBCUTANEOUS
  Administered 2020-10-06: 1 [IU] via SUBCUTANEOUS
  Administered 2020-10-07: 2 [IU] via SUBCUTANEOUS
  Administered 2020-10-07: 3 [IU] via SUBCUTANEOUS
  Administered 2020-10-07: 2 [IU] via SUBCUTANEOUS
  Administered 2020-10-08: 3 [IU] via SUBCUTANEOUS
  Administered 2020-10-08: 2 [IU] via SUBCUTANEOUS
  Administered 2020-10-08: 5 [IU] via SUBCUTANEOUS
  Administered 2020-10-09 (×2): 2 [IU] via SUBCUTANEOUS
  Administered 2020-10-09: 5 [IU] via SUBCUTANEOUS
  Administered 2020-10-10: 3 [IU] via SUBCUTANEOUS
  Filled 2020-09-29: qty 0.09

## 2020-09-29 MED ORDER — ADULT MULTIVITAMIN W/MINERALS CH
1.0000 | ORAL_TABLET | Freq: Every day | ORAL | Status: DC
  Administered 2020-09-29 – 2020-10-10 (×12): 1 via ORAL
  Filled 2020-09-29 (×12): qty 1

## 2020-09-29 MED ORDER — THIAMINE HCL 100 MG/ML IJ SOLN
100.0000 mg | INTRAMUSCULAR | Status: DC
  Administered 2020-10-02 – 2020-10-04 (×3): 100 mg via INTRAVENOUS
  Filled 2020-09-29 (×3): qty 2

## 2020-09-29 MED ORDER — METOCLOPRAMIDE HCL 5 MG/ML IJ SOLN
5.0000 mg | Freq: Four times a day (QID) | INTRAMUSCULAR | Status: DC
  Administered 2020-09-29 – 2020-10-10 (×42): 5 mg via INTRAVENOUS
  Filled 2020-09-29 (×43): qty 2

## 2020-09-29 MED ORDER — ENSURE ENLIVE PO LIQD
237.0000 mL | Freq: Three times a day (TID) | ORAL | Status: DC
  Administered 2020-09-29 – 2020-10-02 (×9): 237 mL via ORAL
  Filled 2020-09-29 (×2): qty 237

## 2020-09-29 MED ORDER — LABETALOL HCL 5 MG/ML IV SOLN: 5.0000 mg | INTRAVENOUS | Status: DC | PRN

## 2020-09-29 MED ORDER — GADOBUTROL 1 MMOL/ML IV SOLN
9.0000 mL | Freq: Once | INTRAVENOUS | Status: AC | PRN
  Administered 2020-09-29: 9 mL via INTRAVENOUS

## 2020-09-29 MED ORDER — THIAMINE HCL 100 MG/ML IJ SOLN
500.0000 mg | Freq: Three times a day (TID) | INTRAVENOUS | Status: AC
  Administered 2020-09-29 – 2020-10-01 (×9): 500 mg via INTRAVENOUS
  Filled 2020-09-29 (×9): qty 5

## 2020-09-29 NOTE — ED Notes (Addendum)
Pt non sustained V tach. EKG tracings reviewed. Pt found to be in non sustained V tach once before while in ED, approx 0147. MD paged.

## 2020-09-29 NOTE — ED Notes (Signed)
MD Blout made aware of ventricular tachycardia episodes as mentioned previously. No new orders at this time.

## 2020-09-29 NOTE — ED Notes (Signed)
Spoke with Doren Custard with [employer]. [facility] eta 1500.

## 2020-09-29 NOTE — ED Notes (Signed)
Prior to transport to MRI, pt "dry heaving" at the bedside. Additionally, this nurse was notified by ED tech that pt's urine, drained from his suprapubic cath bag appeared "cloudy." Hospitalist notified of above and Compazine ordered. Pt's 10:00AM meds held at this time d/t transport to MRI. Will medicate when pt returns from MRI. Hospitalist ordered Compazine to address pt's nausea, will administer when pt returns from MRI.

## 2020-09-29 NOTE — Telephone Encounter (Signed)
Call received from patient's daughter Lattie Haw to cancel pt.'s upcoming appts for next Tuesday d/t pt is currently admitted in the hospital.  Appts canceled and Dr. Marin Olp notified.

## 2020-09-29 NOTE — ED Notes (Signed)
Hospitalist at the bedside 

## 2020-09-29 NOTE — ED Notes (Signed)
Pt to MRI at this time.

## 2020-09-29 NOTE — Telephone Encounter (Signed)
[name] (patients daughter) stated that Mr.Pautz is in the hospital. She stated that if anyone needs to contact her about anything, they could!  Lattie Haw could be contacted at [phone].  Please advise.

## 2020-09-29 NOTE — Progress Notes (Signed)
Initial Nutrition Assessment  DOCUMENTATION CODES:   Not applicable  INTERVENTION:   -Ensure Enlive po TID, each supplement provides 350 kcal and 20 grams of protein  -Magic cup TID with meals, each supplement provides 290 kcal and 9 grams of protein  -MVI with minerals daily  NUTRITION DIAGNOSIS:   Predicted suboptimal nutrient intake related to nausea, vomiting as evidenced by per patient/family report.  GOAL:   Patient will meet greater than or equal to 90% of their needs  MONITOR:   PO intake, Supplement acceptance, Diet advancement, Weight trends, Skin, I & O's  REASON FOR ASSESSMENT:   Consult Assessment of nutrition requirement/status, Poor PO  ASSESSMENT:   87 year old male with history of relapsed non-Hodgkin's lymphoma of the testes in remission who presents with occipital headache, intractable nausea and vomiting, right sided nystagmus with findings of abnormal signal seen on dorsal brainstem on MRI.  Pt admitted with intractable nausea and vomiting, headache, and diplopia.   9/23- MRI revealed abnormal signal seen along the dorsal brainstem with possible minor mass-effect, location accounts for reported intractable vomiting  Reviewed I/O's: +250 ml x 24 hours  UOP: 750 ml x 24 hours  Pt unavailable at time of visit. Attempted to speak with pt via call to hospital room phone, however, unable to reach. RD unable to obtain further nutrition-related history or complete nutrition-focused physical exam at this time.    Per H&P, pt with nausea and vomiting over the past 3 weeks, which is worst when he wakes up and after meals.   No meal completion data available to assess at this time. Pt would greatly benefit from addition of oral nutrition supplements.   Pt with poor oral intake and would benefit from nutrient dense supplement. One Ensure Enlive supplement provides 350 kcals, 20 grams protein, and 44-45 grams of carbohydrate vs one Glucerna shake supplement,  which provides 220 kcals, 10 grams of protein, and 26 grams of carbohydrate. Given pt's hx of DM, RD will reassess adequacy of PO intake, CBGS, and adjust supplement regimen as appropriate at follow-up.    Reviewed wt hx; wt has been stable over the past 6 months.   Medications reviewed and include ativan, reglan, thiamine, and lactated ringers infusion @ 75 ml/hr.   Lab Results  Component Value Date   HGBA1C 6.3 08/02/2020   PTA DM medications are 500 mg metformin at bedtime.   Labs reviewed. Inpatient orders for glycemic control are 0-9 units insulin aspart TID with meals and 0-5 units insulin aspart daily at bedtime.   Labs reviewed.   Diet Order:   Diet Order             Diet full liquid Room service appropriate? Yes; Fluid consistency: Thin  Diet effective now                   EDUCATION NEEDS:   No education needs have been identified at this time  Skin:  Skin Assessment: Reviewed RN Assessment  Last BM:  Unknown  Height:   Ht Readings from Last 1 Encounters:  09/14/20 5\' 10"  (1.778 m)    Weight:   Wt Readings from Last 1 Encounters:  09/12/20 90.2 kg    Ideal Body Weight:  75.5 kg  BMI:  There is no height or weight on file to calculate BMI.  Estimated Nutritional Needs:   Kcal:  0086-7619  Protein:  105-120 grams  Fluid:  > 2 L    Loistine Chance, RD, LDN, CDCES Registered  Dietitian II Certified Diabetes Care and Education Specialist Please refer to [facility] for RD and/or RD on-call/weekend/after hours pager

## 2020-09-29 NOTE — ED Notes (Signed)
Pt A&O x4. Sitting upright in ED stretcher eating breakfast. Attached to cardiac monitor x4. VSS.

## 2020-09-29 NOTE — ED Notes (Signed)
[facility] here to take pt.  

## 2020-09-29 NOTE — Telephone Encounter (Signed)
Dr. Elease Hashimoto patient.  FYI.

## 2020-09-29 NOTE — Telephone Encounter (Signed)
Noted. Will send to Dr. [name] as FYI 

## 2020-09-29 NOTE — ED Notes (Signed)
Verified with Pharmacy thiamine and LR are compatible.

## 2020-09-29 NOTE — ED Notes (Signed)
Pt back from MRI 

## 2020-09-29 NOTE — Progress Notes (Signed)
[facility]                                                                              Patient Demographics  Gary Yates, is a 87 y.o. 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  Per patient, had occipital headache with neck pain and nausea and vomiting for the past 3 weeks.  Symptoms worse when he first wakes up and also after meals.  Also reported intermittent double vision worse later in the day.  Normally ambulates with a walker, has a shuffling gait but per daughter has been worse in the past several weeks.  Patient had several falls due to unsteady gait in the past month and reported that he hit his head on a cabinet several weeks ago.   In ED, UA negative, MRI of the brain without contrast showed abnormal signal involving the dorsal brainstem with possible minor mass-effect. Neurology was consulted, recommended LP however INR supratherapeutic 4.6.  Patient admitted for further work-up.  Assessment & Plan    Principal Problem:   Intractable vomiting with nausea, headache, diplopia -MRI of the brain showed abnormal signal seen along the dorsal brainstem with possible minor mass-effect, location accounts for reported intractable vomiting -Neurology consulted, pending LP due to supratherapeutic INR, awaiting MRI with contrast -Plan  for transfer to [facility], will await neurology recommendations for further management -Started on high-dose IV thiamine, B12 -Symptomatic management, continue IV fluids, antiemetics  Acute kidney injury on CKD stage IIIa -Baseline creatinine ~2 -Presented with creatinine of 2.6, likely due to #1, dehydration  -placed on IV fluid hydration, creatinine now improving, close to baseline  Paroxysmal atrial fibrillation -Heart rate currently controlled -Coumadin held due to supratherapeutic INR 4.6 on admission, goal 2-3 -INR improving 3.0  History of relapsed non-Hodgkin lymphoma of the right testicle -Currently followed by Dr. Marin Olp  Hypertension -BP currently stable, hold Lasix due to AKI  Type 2 diabetes mellitus, NIDDM, not on long-term insulin, complications with CKD -CBGs controlled, hold metformin -Placed on sliding scale insulin while inpatient  Hypothyroidism -Continue Synthroid, TSH 1.7, free T4 1.1  Peripheral neuropathy -Continue gabapentin, B12  Code Status: Full CODE STATUS DVT Prophylaxis:  SCDs Start: 09/28/20 2007   Level of Care: Level of care: Progressive Family Communication: Discussed all imaging results, lab results, explained to the patient and patient's son at the bedside   Disposition Plan:     Status is: Inpatient  Remains inpatient appropriate because:Inpatient level of care appropriate due  to severity of illness  Dispo: The patient is from: Home              Anticipated d/c is to: Home              Patient currently is not medically stable to d/c.   Difficult to place patient No      Time Spent in minutes   35 minutes  Procedures:  None  Consultants:   Neurology Antimicrobials:   Anti-infectives (From admission, onward)    None          Medications  Scheduled Meds:  atorvastatin  40 mg Oral Daily   escitalopram  10 mg Oral Daily   gabapentin  600 mg Oral BID   levothyroxine  25 mcg Oral Q0600   LORazepam  0.5 mg  Intravenous Once   pantoprazole  40 mg Oral Daily   pramipexole  3 mg Oral QHS   [START ON 10/02/2020] thiamine injection  100 mg Intravenous Q24H   vitamin B-12  1,000 mcg Oral Daily   Continuous Infusions:  thiamine injection 500 mg (09/29/20 0612)   PRN Meds:.acetaminophen, fentaNYL (SUBLIMAZE) injection, labetalol, ondansetron (ZOFRAN) IV, polyethylene glycol, prochlorperazine      Subjective:   Gary Yates was seen and examined today.  Complaining of headache, nausea but no ongoing vomiting at the time of my examination.  No fevers or chills.  No chest pain, or shortness of breath, abdominal pain or diarrhea.  Objective:   Vitals:   09/29/20 0802 09/29/20 0807 09/29/20 0812 09/29/20 0852  BP:    (!) 141/83  Pulse: 74 88 83 83  Resp: 16 (!) 25 15 14   Temp:    98 F (36.7 C)  TempSrc:    Oral  SpO2: 98% 97% 97% 97%    Intake/Output Summary (Last 24 hours) at 09/29/2020 1154 Last data filed at 09/29/2020 1053 Gross per 24 hour  Intake --  Output 850 ml  Net -850 ml     Wt Readings from Last 3 Encounters:  09/12/20 90.2 kg  08/30/20 91.5 kg  08/02/20 90.8 kg     Exam General: Alert and oriented x 3, NAD, sick appearing Cardiovascular: S1 S2 auscultated, RRR Respiratory: Clear to auscultation bilaterally Gastrointestinal: Soft, nontender, nondistended, + bowel sounds Ext: no pedal edema bilaterally Neuro: strength 5/5 in all 4 extremities Psych: Normal affect and demeanor, alert and oriented x3    Data Reviewed:  I have personally reviewed following labs and imaging studies  Micro Results Recent Results (from the past 240 hour(s))  Resp Panel by RT-PCR (Flu A&B, Covid) Nasopharyngeal Swab     Status: None   Collection Time: 09/28/20  9:45 AM   Specimen: Nasopharyngeal Swab; Nasopharyngeal(NP) swabs in vial transport medium  Result Value Ref Range Status   SARS Coronavirus 2 by RT PCR NEGATIVE NEGATIVE Final    Comment: (NOTE) SARS-CoV-2 target nucleic  acids are NOT DETECTED.  The SARS-CoV-2 RNA is generally detectable in upper respiratory specimens during the acute phase of infection. The lowest concentration of SARS-CoV-2 viral copies this assay can detect is 138 copies/mL. A negative result does not preclude SARS-Cov-2 infection and should not be used as the sole basis for treatment or other patient management decisions. A negative result may occur with  improper specimen collection/handling, submission of specimen other than nasopharyngeal swab, presence of viral mutation(s) within the areas targeted by this assay, and inadequate number of viral copies(<138 copies/mL). A negative result must be combined  with clinical observations, patient history, and epidemiological information. The expected result is Negative.  Fact Sheet for Patients:  EntrepreneurPulse.com.au  Fact Sheet for Healthcare Providers:  IncredibleEmployment.be  This test is no t yet approved or cleared by the [place] FDA and  has been authorized for detection and/or diagnosis of SARS-CoV-2 by FDA under an Emergency Use Authorization (EUA). This EUA will remain  in effect (meaning this test can be used) for the duration of the COVID-19 declaration under Section 564(b)(1) of the Act, 21 U.S.C.section 360bbb-3(b)(1), unless the authorization is terminated  or revoked sooner.       Influenza A by PCR NEGATIVE NEGATIVE Final   Influenza B by PCR NEGATIVE NEGATIVE Final    Comment: (NOTE) The Xpert Xpress SARS-CoV-2/FLU/RSV plus assay is intended as an aid in the diagnosis of influenza from Nasopharyngeal swab specimens and should not be used as a sole basis for treatment. Nasal washings and aspirates are unacceptable for Xpert Xpress SARS-CoV-2/FLU/RSV testing.  Fact Sheet for Patients: EntrepreneurPulse.com.au  Fact Sheet for Healthcare Providers: IncredibleEmployment.be  This  test is not yet approved or cleared by the [place] FDA and has been authorized for detection and/or diagnosis of SARS-CoV-2 by FDA under an Emergency Use Authorization (EUA). This EUA will remain in effect (meaning this test can be used) for the duration of the COVID-19 declaration under Section 564(b)(1) of the Act, 21 U.S.C. section 360bbb-3(b)(1), unless the authorization is terminated or revoked.  Performed at [facility], [place] [address], [place],  [address]     Radiology Reports CT ABDOMEN PELVIS WO CONTRAST  Result Date: 09/28/2020 CLINICAL DATA:  Nausea, vomiting, emesis EXAM: CT ABDOMEN AND PELVIS WITHOUT CONTRAST TECHNIQUE: Multidetector CT imaging of the abdomen and pelvis was performed following the standard protocol without IV contrast. COMPARISON:  07/23/2019 FINDINGS: Lower chest: No acute abnormality. Cardiomegaly. Coronary artery calcifications. Hepatobiliary: No focal liver abnormality is seen. Status post cholecystectomy. No biliary dilatation. Pancreas: Unremarkable. No pancreatic ductal dilatation or surrounding inflammatory changes. Spleen: Normal in size without significant abnormality. Adrenals/Urinary Tract: Adrenal glands are unremarkable. Nine bilateral renal cysts, including a high attenuation hemorrhagic or proteinaceous cyst of the posterior midportion of the right kidney. Kidneys are otherwise normal, without renal calculi, solid lesion, or hydronephrosis. Suprapubic catheter. Stomach/Bowel: Stomach is within normal limits. Appendix appears normal. No evidence of bowel wall thickening, distention, or inflammatory changes. Vascular/Lymphatic: Aortic atherosclerosis. No enlarged abdominal or pelvic lymph nodes. Reproductive: Prostatomegaly. Other: No abdominal wall hernia or abnormality. No abdominopelvic ascites. Musculoskeletal: No acute or significant osseous findings. IMPRESSION: 1. No acute noncontrast CT findings of the abdomen or  pelvis to explain nausea or vomiting. 2. Suprapubic catheter. 3. Prostatomegaly. 4. Coronary artery disease. Aortic Atherosclerosis (ICD10-I70.0). Electronically Signed   By: Eddie Candle M.D.   On: 09/28/2020 11:14   CT Head Wo Contrast  Result Date: 09/24/2020 CLINICAL DATA:  Headache.  Double vision. EXAM: CT HEAD WITHOUT CONTRAST TECHNIQUE: Contiguous axial images were obtained from the base of the skull through the vertex without intravenous contrast. COMPARISON:  CT head 07/23/2019. FINDINGS: Brain: No evidence of acute infarction, hemorrhage, hydrocephalus, extra-axial collection or mass lesion/mass effect. Again seen is mild diffuse atrophy, unchanged. There is stable mild patchy periventricular and deep white matter hypodensity, likely chronic small vessel ischemic change. Small calcified meningioma measuring 7 mm overlying the right tentorium appears unchanged from the prior exam. Vascular: Atherosclerotic calcifications are present within the cavernous internal carotid arteries. Skull: Normal. Negative for  fracture or focal lesion. Sinuses/Orbits: No acute finding. Other: None. IMPRESSION: No acute intracranial abnormality. Electronically Signed   By: Ronney Asters M.D.   On: 09/24/2020 21:07   MR BRAIN WO CONTRAST  Result Date: 09/28/2020 CLINICAL DATA:  Stroke, follow up EXAM: MRI HEAD WITHOUT CONTRAST TECHNIQUE: Multiplanar, multiecho pulse sequences of the brain and surrounding structures were obtained without intravenous contrast. COMPARISON:  Most recent MRI is from 2013 FINDINGS: Brain: There is no acute infarction or intracranial hemorrhage. There is no intracranial mass or significant mass effect. There is no hydrocephalus or extra-axial fluid collection. There is abnormal T2 hyperintensity involving the dorsal midbrain, pons, and medulla. Possible slight effacement of cerebral aqueduct focally. Additional patchy and confluent areas of T2 hyperintensity in the supratentorial white matter  are nonspecific but probably reflect chronic microvascular ischemic changes. Prominence of the ventricles and sulci reflects parenchymal volume loss. Vascular: Major vessel flow voids at the skull base are preserved. Skull and upper cervical spine: Normal marrow signal is preserved. Sinuses/Orbits: Minor mucosal thickening. Bilateral lens replacements. Other: Sella is unremarkable.  Mastoid air cells are clear. IMPRESSION: Abnormal signal involving the dorsal brainstem with possible minor mass effect. The location accounts for reported intractable vomiting. Differential is wide and includes toxic/metabolic, infectious/post infectious, demyelinating/inflammatory, and neoplastic etiologies. Postcontrast imaging is recommended. Electronically Signed   By: Macy Mis M.D.   On: 09/28/2020 15:09    Lab Data:  CBC: Recent Labs  Lab 09/24/20 1841 09/28/20 0935 09/29/20 0435  WBC 4.9 5.2 6.3  NEUTROABS 3.6 3.5  --   HGB 11.5* 10.7* 11.3*  HCT 34.9* 33.2* 34.5*  MCV 95.4 98.2 96.9  PLT 122* 119* 222*   Basic Metabolic Panel: Recent Labs  Lab 09/24/20 1841 09/28/20 0935 09/29/20 0435  NA 137 135 136  K 4.4 4.3 4.1  CL 105 102 102  CO2 22 22 26   GLUCOSE 110* 125* 126*  BUN 25* 37* 28*  CREATININE 2.19* 2.62* 2.03*  CALCIUM 9.6 9.3 9.0   GFR: CrCl cannot be calculated (Unknown ideal weight.). Liver Function Tests: Recent Labs  Lab 09/28/20 0935  AST 14*  ALT 7  ALKPHOS 86  BILITOT 0.7  PROT 6.7  ALBUMIN 3.7   Recent Labs  Lab 09/28/20 0935  LIPASE 51   No results for input(s): AMMONIA in the last 168 hours. Coagulation Profile: Recent Labs  Lab 09/26/20 0000 09/28/20 1532 09/29/20 0849  INR 5.8* 4.6* 3.0*   Cardiac Enzymes: Recent Labs  Lab 09/29/20 0435  CKTOTAL 38*   BNP (last 3 results) No results for input(s): PROBNP in the last 8760 hours. HbA1C: No results for input(s): HGBA1C in the last 72 hours. CBG: No results for input(s): GLUCAP in the last  168 hours. Lipid Profile: No results for input(s): CHOL, HDL, LDLCALC, TRIG, CHOLHDL, LDLDIRECT in the last 72 hours. Thyroid Function Tests: Recent Labs    09/28/20 0936  TSH 1.771  FREET4 1.13*   Anemia Panel: Recent Labs    09/28/20 1959  VITAMINB12 1,214*   Urine analysis:    Component Value Date/Time   COLORURINE YELLOW 09/28/2020 1230   APPEARANCEUR CLEAR 09/28/2020 1230   LABSPEC <1.005 (L) 09/28/2020 1230   PHURINE 6.0 09/28/2020 1230   GLUCOSEU NEGATIVE 09/28/2020 1230   HGBUR LARGE (A) 09/28/2020 1230   HGBUR trace-intact 12/29/2006 0827   BILIRUBINUR NEGATIVE 09/28/2020 1230   KETONESUR NEGATIVE 09/28/2020 1230   PROTEINUR 30 (A) 09/28/2020 1230   UROBILINOGEN 0.2 12/29/2006 0827  NITRITE NEGATIVE 09/28/2020 [place] 09/28/2020 1230     Shabazz Mckey M.D. [facility] 09/29/2020, 11:54 AM  Available via Epic secure chat 7am-7pm After 7 pm, please refer to night coverage provider listed on amion.

## 2020-09-30 DIAGNOSIS — I10 Essential (primary) hypertension: Secondary | ICD-10-CM | POA: Diagnosis not present

## 2020-09-30 DIAGNOSIS — R112 Nausea with vomiting, unspecified: Secondary | ICD-10-CM | POA: Diagnosis not present

## 2020-09-30 DIAGNOSIS — C8589 Other specified types of non-Hodgkin lymphoma, extranodal and solid organ sites: Secondary | ICD-10-CM | POA: Diagnosis not present

## 2020-09-30 DIAGNOSIS — N170 Acute kidney failure with tubular necrosis: Secondary | ICD-10-CM | POA: Diagnosis not present

## 2020-09-30 LAB — BASIC METABOLIC PANEL
Anion gap: 7 (ref 5–15)
BUN: 20 mg/dL (ref 8–23)
CO2: 26 mmol/L (ref 22–32)
Calcium: 9.1 mg/dL (ref 8.9–10.3)
Chloride: 102 mmol/L (ref 98–111)
Creatinine, Ser: 1.85 mg/dL — ABNORMAL HIGH (ref 0.61–1.24)
GFR, Estimated: 35 mL/min — ABNORMAL LOW (ref >60)
Glucose, Bld: 115 mg/dL — ABNORMAL HIGH (ref 70–99)
Potassium: 4.3 mmol/L (ref 3.5–5.1)
Sodium: 135 mmol/L (ref 135–145)

## 2020-09-30 LAB — URINE CULTURE: Culture: NO GROWTH

## 2020-09-30 LAB — GLUCOSE, CAPILLARY
Glucose-Capillary: 120 mg/dL — ABNORMAL HIGH (ref 70–99)
Glucose-Capillary: 131 mg/dL — ABNORMAL HIGH (ref 70–99)
Glucose-Capillary: 107 mg/dL — ABNORMAL HIGH (ref 70–99)
Glucose-Capillary: 135 mg/dL — ABNORMAL HIGH (ref 70–99)

## 2020-09-30 LAB — PROTIME-INR
INR: 2.6 — ABNORMAL HIGH (ref 0.8–1.2)
Prothrombin Time: 28 seconds — ABNORMAL HIGH (ref 11.4–15.2)

## 2020-09-30 MED ORDER — FOLIC ACID 1 MG PO TABS
2.0000 mg | ORAL_TABLET | Freq: Every day | ORAL | Status: DC
  Administered 2020-09-30 – 2020-10-10 (×11): 2 mg via ORAL
  Filled 2020-09-30 (×11): qty 2

## 2020-09-30 MED ORDER — CHLORHEXIDINE GLUCONATE CLOTH 2 % EX PADS
6.0000 | MEDICATED_PAD | Freq: Every day | CUTANEOUS | Status: DC
  Administered 2020-09-30 – 2020-10-10 (×12): 6 via TOPICAL

## 2020-09-30 NOTE — Plan of Care (Signed)
Chart review note MRI brain with contrast shows masslike enhancement in the region of the cerebral aqueduct and nodular enhancement along the ventral aspect of the fourth ventricle and multiple cranial nerves.  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 Will require lumbar puncture.  INR still remains unsafe for LP. Wait for the INR to come less than or equal to 1.5 before proceeding with LP. Will need cytology and flow cytometry in addition to the usual test. I am hesitant to start steroids unless the patient remains extremely symptomatic as it could clued the results especially if it is some sort of a lymphomatous process, which is extremely steroid responsive. Plan discussed with primary hospitalist Dr Sloan Leiter -- Amie Portland, MD Neurologist [facility] Pager: [phone]

## 2020-09-30 NOTE — Progress Notes (Signed)
PROGRESS NOTE        PATIENT DETAILS Name: Gary Yates. Age: 87 y.o. Sex: male Date of Birth: [birth year] Admit Date: 09/28/2020 Admitting Physician Orene Desanctis, DO [id number], Alinda Sierras, MD  Brief Narrative: Patient is a 87 y.o. 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  See below for further details.  Subjective: Still nauseous-headache better.  No vomiting this morning-having occasional dry heaves.  Objective: Vitals: Blood pressure 140/68, pulse 85, temperature 98.2 F (36.8 C), temperature source Oral, resp. rate 12, height 5\' 10"  (1.778 m), weight 90 kg, SpO2 96 %.   Exam: Gen Exam:Alert awake-not in any distress HEENT:atraumatic, normocephalic Chest: B/L clear to auscultation anteriorly CVS:S1S2 regular Abdomen:soft non tender, non distended Extremities:no edema Neurology: Non focal Skin: no rash  Pertinent Labs/Radiology: WBC: 6.3 Hb: 11.3 PLT: 121 Na: 135 K: 4.3 Creatinine: 1.85 INR: 2.6  9/23>>Urine Culture: Pending  9/22: CT abdomen/pelvis: No acute abnormality-prostatomegaly-no enlarged lymph nodes. 9/22: MRI brain-no contrast: T2 hyperintensity involving dorsal midbrain/pons/medulla 9/23>> MRI brain with contrast: Masslike enhancement of the cerebral aqueduct-nodular enhancement along the ventricle and multiple cranial nerves-suspicious for leptomeningeal spread of malignancy  Assessment/Plan: Headache with intractable nausea/vomiting/diplopia/nystagmus: Likely due to leptomeningeal involvement of CNS from underlying malignancy.  Headache better-nausea vomiting currently relatively well controlled with antiemetics.  INR still elevated at 2.6.  Discussed with  neurology-continue supportive care-LP with cytology/flow cytometry plan for this coming Monday.  Have sent a epic message to primary oncologist-Dr. Marin Olp as well.  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  AKI on CKD stage IIIa: AKI likely hemodynamically mediated in the setting of nausea/vomiting-improved with supportive care.  PAF: Rate controlled-INR stable at 2.6.  Once < 2-we will place on heparin infusion until LP is completed.    Hypothyroidism: Continue Synthroid  HTN: BP controlled-monitor off antihypertensives.  DM-2 (A1c 6.3 on 7/27): CBG stable-on SSI.  Recent Labs    09/29/20 1647 09/29/20 2042 09/30/20 0815  GLUCAP 97 120* 120*    Peripheral neuropathy: On Neurontin.  Nutrition Status: Nutrition Problem: Predicted suboptimal nutrient intake Etiology: nausea, vomiting Signs/Symptoms: per patient/family report Interventions: Ensure Enlive (each supplement provides 350kcal and 20 grams of protein), Magic cup, MVI   Estimated body mass index is 28.47 kg/m as calculated from the following:   Height as of this encounter: 5\' 10"  (1.778 m).   Weight as of this encounter: 90 kg.   Procedures: None Consults: Neurology DVT Prophylaxis: Therapeutic INR-Coumadin on hold. Code Status:Full code  Family Communication: Daughter at bedside  Time spent: 28 minutes-Greater than 50% of this time was spent in counseling, explanation of diagnosis, planning of further management, and coordination of care.  Diet: Diet Order             Diet full liquid Room service appropriate? Yes; Fluid consistency: Thin  Diet effective now                      Disposition Plan: Status is: Inpatient  Remains inpatient appropriate because:Inpatient level of care appropriate due to severity of illness  Dispo: The patient is from: Home  Anticipated d/c is to: Home              Patient currently is not  medically stable to d/c.   Difficult to place patient No     Barriers to Discharge: Intractable headache/nausea/vomiting due to leptomeningeal involvement-lumbar puncture scheduled for this coming Monday.  Needs scheduled antiemetics to control symptoms.  Not yet stable for discharge.  Antimicrobial agents: Anti-infectives (From admission, onward)    None        MEDICATIONS: Scheduled Meds:  atorvastatin  40 mg Oral Daily   Chlorhexidine Gluconate Cloth  6 each Topical Daily   escitalopram  10 mg Oral Daily   feeding supplement  237 mL Oral TID BM   folic acid  2 mg Oral Daily   gabapentin  600 mg Oral BID   insulin aspart  0-5 Units Subcutaneous QHS   insulin aspart  0-9 Units Subcutaneous TID WC   levothyroxine  25 mcg Oral Q0600   LORazepam  0.5 mg Intravenous Once   metoCLOPramide (REGLAN) injection  5 mg Intravenous Q6H   multivitamin with minerals  1 tablet Oral Daily   pantoprazole  40 mg Oral Daily   pramipexole  3 mg Oral QHS   [START ON 10/02/2020] thiamine injection  100 mg Intravenous Q24H   vitamin B-12  1,000 mcg Oral Daily   Continuous Infusions:  lactated ringers 75 mL/hr at 09/30/20 0146   thiamine injection 500 mg (09/30/20 0637)   PRN Meds:.acetaminophen, fentaNYL (SUBLIMAZE) injection, labetalol, ondansetron (ZOFRAN) IV, polyethylene glycol, prochlorperazine   I have personally reviewed following labs and imaging studies  LABORATORY DATA: CBC: Recent Labs  Lab 09/24/20 1841 09/28/20 0935 09/29/20 0435  WBC 4.9 5.2 6.3  NEUTROABS 3.6 3.5  --   HGB 11.5* 10.7* 11.3*  HCT 34.9* 33.2* 34.5*  MCV 95.4 98.2 96.9  PLT 122* 119* 121*    Basic Metabolic Panel: Recent Labs  Lab 09/24/20 1841 09/28/20 0935 09/29/20 0435 09/30/20 0242  NA 137 135 136 135  K 4.4 4.3 4.1 4.3  CL 105 102 102 102  CO2 22 22 26 26   GLUCOSE 110* 125* 126* 115*  BUN 25* 37* 28* 20  CREATININE 2.19* 2.62* 2.03* 1.85*  CALCIUM 9.6 9.3 9.0 9.1     GFR: Estimated Creatinine Clearance: 33 mL/min (A) (by C-G formula based on SCr of 1.85 mg/dL (H)).  Liver Function Tests: Recent Labs  Lab 09/28/20 0935  AST 14*  ALT 7  ALKPHOS 86  BILITOT 0.7  PROT 6.7  ALBUMIN 3.7   Recent Labs  Lab 09/28/20 0935  LIPASE 51   No results for input(s): AMMONIA in the last 168 hours.  Coagulation Profile: Recent Labs  Lab 09/26/20 0000 09/28/20 1532 09/29/20 0849 09/30/20 0830  INR 5.8* 4.6* 3.0* 2.6*    Cardiac Enzymes: Recent Labs  Lab 09/29/20 0435  CKTOTAL 38*    BNP (last 3 results) No results for input(s): PROBNP in the last 8760 hours.  Lipid Profile: No results for input(s): CHOL, HDL, LDLCALC, TRIG, CHOLHDL, LDLDIRECT in the last 72 hours.  Thyroid Function Tests: Recent Labs    09/28/20 0936  TSH 1.771  FREET4 1.13*    Anemia Panel: Recent Labs    09/28/20 1959 09/29/20 1236  VITAMINB12 1,214*  --   FOLATE  --  3.1*    Urine analysis:    Component Value Date/Time   COLORURINE YELLOW 09/28/2020 1230   APPEARANCEUR CLEAR 09/28/2020 1230   LABSPEC <1.005 (  L) 09/28/2020 1230   PHURINE 6.0 09/28/2020 1230   GLUCOSEU NEGATIVE 09/28/2020 1230   HGBUR LARGE (A) 09/28/2020 1230   HGBUR trace-intact 12/29/2006 0827   BILIRUBINUR NEGATIVE 09/28/2020 1230   KETONESUR NEGATIVE 09/28/2020 1230   PROTEINUR 30 (A) 09/28/2020 1230   UROBILINOGEN 0.2 12/29/2006 0827   NITRITE NEGATIVE 09/28/2020 1230   LEUKOCYTESUR NEGATIVE 09/28/2020 1230    Sepsis Labs: Lactic Acid, Venous    Component Value Date/Time   LATICACIDVEN 2.00 (HH) 06/16/2017 2337    MICROBIOLOGY: Recent Results (from the past 240 hour(s))  Resp Panel by RT-PCR (Flu A&B, Covid) Nasopharyngeal Swab     Status: None   Collection Time: 09/28/20  9:45 AM   Specimen: Nasopharyngeal Swab; Nasopharyngeal(NP) swabs in vial transport medium  Result Value Ref Range Status   SARS Coronavirus 2 by RT PCR NEGATIVE NEGATIVE Final    Comment:  (NOTE) SARS-CoV-2 target nucleic acids are NOT DETECTED.  The SARS-CoV-2 RNA is generally detectable in upper respiratory specimens during the acute phase of infection. The lowest concentration of SARS-CoV-2 viral copies this assay can detect is 138 copies/mL. A negative result does not preclude SARS-Cov-2 infection and should not be used as the sole basis for treatment or other patient management decisions. A negative result may occur with  improper specimen collection/handling, submission of specimen other than nasopharyngeal swab, presence of viral mutation(s) within the areas targeted by this assay, and inadequate number of viral copies(<138 copies/mL). A negative result must be combined with clinical observations, patient history, and epidemiological information. The expected result is Negative.  Fact Sheet for Patients:  EntrepreneurPulse.com.au  Fact Sheet for Healthcare Providers:  IncredibleEmployment.be  This test is no t yet approved or cleared by the [place] FDA and  has been authorized for detection and/or diagnosis of SARS-CoV-2 by FDA under an Emergency Use Authorization (EUA). This EUA will remain  in effect (meaning this test can be used) for the duration of the COVID-19 declaration under Section 564(b)(1) of the Act, 21 U.S.C.section 360bbb-3(b)(1), unless the authorization is terminated  or revoked sooner.       Influenza A by PCR NEGATIVE NEGATIVE Final   Influenza B by PCR NEGATIVE NEGATIVE Final    Comment: (NOTE) The Xpert Xpress SARS-CoV-2/FLU/RSV plus assay is intended as an aid in the diagnosis of influenza from Nasopharyngeal swab specimens and should not be used as a sole basis for treatment. Nasal washings and aspirates are unacceptable for Xpert Xpress SARS-CoV-2/FLU/RSV testing.  Fact Sheet for Patients: EntrepreneurPulse.com.au  Fact Sheet for Healthcare  Providers: IncredibleEmployment.be  This test is not yet approved or cleared by the [place] FDA and has been authorized for detection and/or diagnosis of SARS-CoV-2 by FDA under an Emergency Use Authorization (EUA). This EUA will remain in effect (meaning this test can be used) for the duration of the COVID-19 declaration under Section 564(b)(1) of the Act, 21 U.S.C. section 360bbb-3(b)(1), unless the authorization is terminated or revoked.  Performed at [facility], [place] [address] [place], [place] [address]     RADIOLOGY STUDIES/RESULTS: MR BRAIN WO CONTRAST  Result Date: 09/28/2020 CLINICAL DATA:  Stroke, follow up EXAM: MRI HEAD WITHOUT CONTRAST TECHNIQUE: Multiplanar, multiecho pulse sequences of the brain and surrounding structures were obtained without intravenous contrast. COMPARISON:  Most recent MRI is from 2013 FINDINGS: Brain: There is no acute infarction or intracranial hemorrhage. There is no intracranial mass or significant mass effect. There is no hydrocephalus or extra-axial fluid collection. There  is abnormal T2 hyperintensity involving the dorsal midbrain, pons, and medulla. Possible slight effacement of cerebral aqueduct focally. Additional patchy and confluent areas of T2 hyperintensity in the supratentorial white matter are nonspecific but probably reflect chronic microvascular ischemic changes. Prominence of the ventricles and sulci reflects parenchymal volume loss. Vascular: Major vessel flow voids at the skull base are preserved. Skull and upper cervical spine: Normal marrow signal is preserved. Sinuses/Orbits: Minor mucosal thickening. Bilateral lens replacements. Other: Sella is unremarkable.  Mastoid air cells are clear. IMPRESSION: Abnormal signal involving the dorsal brainstem with possible minor mass effect. The location accounts for reported intractable vomiting. Differential is wide and includes toxic/metabolic,  infectious/post infectious, demyelinating/inflammatory, and neoplastic etiologies. Postcontrast imaging is recommended. Electronically Signed   By: Macy Mis M.D.   On: 09/28/2020 15:09   MR BRAIN W CONTRAST  Result Date: 09/29/2020 CLINICAL DATA:  Follow-up from prior exam obtained 1 day ago EXAM: MRI HEAD WITH CONTRAST TECHNIQUE: Multiplanar, multiecho pulse sequences of the brain and surrounding structures were obtained with intravenous contrast. CONTRAST:  45mL GADAVIST GADOBUTROL 1 MMOL/ML IV SOLN COMPARISON:  Noncontrast brain MRI obtained 1 day prior FINDINGS: Brain: There is focal masslike enhancement in the region of the cerebral aqueduct measuring 0.9 cm AP by 0.9 cm TV by 0.9 cm cc corresponding to T2/FLAIR signal abnormality seen on the prior study. There is separate nodular enhancement along the anterior aspect of the fourth ventricle more inferiorly. There is nodular appearing enhancement of the cranial nerve 7/8 nerve complexes in the bilateral IAC's. There is abnormal enhancement of the right fifth cranial nerve. There is a homogeneously enhancing lesion along the lateral aspect of the right tentorial leaflet measuring up to 0.8 cm by 0.5 cm corresponding to a calcified lesion present on prior head CTs, most likely reflecting a meningioma. Vascular: The major blood vessels enhance normally. Skull and upper cervical spine: Normal marrow signal. Sinuses/Orbits: Grossly unremarkable. Other: None. IMPRESSION: Masslike enhancement in the region of the cerebral aqueduct measuring up to 0.9 cm, and nodular enhancement along the ventral aspect of fourth ventricle and multiple cranial nerves as above. 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 Recommend correlation with lumbar puncture. Electronically Signed   By: Valetta Mole M.D.   On: 09/29/2020 11:53     LOS: 2 days   Oren Binet, MD  [facility]    To contact the attending provider between 7A-7P or  the covering provider during after hours 7P-7A, please log into the web site [email or web address] and access using universal [place] password for that web site. If you do not have the password, please call the hospital operator.  09/30/2020, 10:47 AM

## 2020-10-01 ENCOUNTER — Inpatient Hospital Stay (HOSPITAL_COMMUNITY): Payer: Medicare Other

## 2020-10-01 DIAGNOSIS — R112 Nausea with vomiting, unspecified: Secondary | ICD-10-CM | POA: Diagnosis not present

## 2020-10-01 LAB — GLUCOSE, CAPILLARY
Glucose-Capillary: 118 mg/dL — ABNORMAL HIGH (ref 70–99)
Glucose-Capillary: 178 mg/dL — ABNORMAL HIGH (ref 70–99)
Glucose-Capillary: 92 mg/dL (ref 70–99)
Glucose-Capillary: 124 mg/dL — ABNORMAL HIGH (ref 70–99)

## 2020-10-01 LAB — BASIC METABOLIC PANEL
Anion gap: 9 (ref 5–15)
BUN: 18 mg/dL (ref 8–23)
CO2: 25 mmol/L (ref 22–32)
Calcium: 8.8 mg/dL — ABNORMAL LOW (ref 8.9–10.3)
Chloride: 102 mmol/L (ref 98–111)
Creatinine, Ser: 1.73 mg/dL — ABNORMAL HIGH (ref 0.61–1.24)
GFR, Estimated: 38 mL/min — ABNORMAL LOW (ref >60)
Glucose, Bld: 104 mg/dL — ABNORMAL HIGH (ref 70–99)
Potassium: 4 mmol/L (ref 3.5–5.1)
Sodium: 136 mmol/L (ref 135–145)

## 2020-10-01 LAB — CBC
HCT: 33.2 % — ABNORMAL LOW (ref 39.0–52.0)
Hemoglobin: 11.1 g/dL — ABNORMAL LOW (ref 13.0–17.0)
MCH: 31.8 pg (ref 26.0–34.0)
MCHC: 33.4 g/dL (ref 30.0–36.0)
MCV: 95.1 fL (ref 80.0–100.0)
Platelets: 125 10*3/uL — ABNORMAL LOW (ref 150–400)
RBC: 3.49 MIL/uL — ABNORMAL LOW (ref 4.22–5.81)
RDW: 15.6 % — ABNORMAL HIGH (ref 11.5–15.5)
WBC: 7.1 10*3/uL (ref 4.0–10.5)
nRBC: 0 % (ref 0.0–0.2)

## 2020-10-01 LAB — ZINC: Zinc: 80 ug/dL (ref 44–115)

## 2020-10-01 LAB — PROTIME-INR
INR: 2.2 — ABNORMAL HIGH (ref 0.8–1.2)
Prothrombin Time: 24.6 seconds — ABNORMAL HIGH (ref 11.4–15.2)

## 2020-10-01 LAB — COPPER, SERUM: Copper: 104 ug/dL (ref 69–132)

## 2020-10-01 LAB — BRAIN NATRIURETIC PEPTIDE: B Natriuretic Peptide: 470.8 pg/mL — ABNORMAL HIGH (ref 0.0–100.0)

## 2020-10-01 MED ORDER — FUROSEMIDE 40 MG PO TABS
40.0000 mg | ORAL_TABLET | Freq: Once | ORAL | Status: AC
  Administered 2020-10-01: 40 mg via ORAL
  Filled 2020-10-01: qty 1

## 2020-10-01 NOTE — Plan of Care (Signed)

## 2020-10-01 NOTE — Evaluation (Signed)
Physical Therapy Evaluation Patient Details Name: Gary Yates. MRN: [id number] DOB: 10-12-1933 Today's Date: 10/01/2020  History of Present Illness  [name] who presents on 09/28/20 with several week hx of dizziness, headache, N/V. 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 Plan for LP with cytology/flow cytometry on Monday, 9/26. PMH includes A-fib, COPD, CAD, non-Hodgkin's lymphoma in remission, HTN, OSA, DM.  Clinical Impression  Patient presents with dizziness, nystagmus, impaired balance and impaired mobility s/p above. Pt lives at home alone but has a caregiver come in 3 days per week for 3-4 hours at a time as well as family checking in daily. Reports multiple falls at home. Daughter present during session and helped provide info. Pt uses rollator for all mobility and can perform ADLs. Today, pt requires Min-mod A for mobility and tolerated transfer to chair but declined further ambulation due to dizziness. Reports getting HHPT prior to admission as well. Daughter would like pt to be able to return home. Discussed chatting with caregiver to see if she can provide more support/hours/days at home at d/c. If pt does not progress with mobility in the hospital, may need to consider SNF. Will follow acutely to maximize independence and mobility prior to return home.     Recommendations for follow up therapy are one component of a multi-disciplinary discharge planning process, led by the attending physician.  Recommendations may be updated based on patient status, additional functional criteria and insurance authorization.  Follow Up Recommendations Home health PT;Supervision for mobility/OOB (initial 24/7 at this time if able)    Equipment Recommendations  None recommended by PT    Recommendations for Other Services       Precautions / Restrictions  Precautions Precautions: Fall Precaution Comments: hx of falls Restrictions Weight Bearing Restrictions: No      Mobility  Bed Mobility Overal bed mobility: Needs Assistance Bed Mobility: Supine to Sit     Supine to sit: Min assist;HOB elevated     General bed mobility comments: Difficulty getting LEs to EOB, assist with trunk to come upright and use of rail, posterior lean. Assist to scoot right hip forward.    Transfers Overall transfer level: Needs assistance Equipment used: Rolling walker (2 wheeled) Transfers: Sit to/from Stand Sit to Stand: Mod assist         General transfer comment: ASsist to power to standing with cues for hand placement/technique with pt wanting to pull up on RW. Stood from [employer], transferred to chair post ambulation. + dizziness. Posterior bias at hips with LEs locked out into extension.  Ambulation/Gait Ambulation/Gait assistance: Min assist Gait Distance (Feet): 3 Feet Assistive device: Rolling walker (2 wheeled) Gait Pattern/deviations: Trunk flexed Gait velocity: decreased   General Gait Details: Able to take a few steps to get to chair with Min A for balance, limited due to dizzines.  Stairs            Wheelchair Mobility    Modified Rankin (Stroke Patients Only)       Balance Overall balance assessment: History of Falls;Needs assistance Sitting-balance support: Feet supported;Single extremity supported Sitting balance-Leahy Scale: Fair     Standing balance support: During functional activity Standing balance-Leahy Scale: Poor Standing balance comment: Requires UE support, poor standing tolerance due to dizziness.  Pertinent Vitals/Pain Pain Assessment: No/denies pain    Home Living Family/patient expects to be discharged to:: Private residence Living Arrangements: Alone;Non-relatives/Friends Available Help at Discharge: Family;Available PRN/intermittently Type of Home:  House Home Access: Stairs to enter Entrance Stairs-Rails: Right Entrance Stairs-Number of Steps: 2 Home Layout: Able to live on main level with bedroom/bathroom;Multi-level;Laundry or work area in [place]: Environmental consultant - 2 wheels;Walker - 4 wheels;Grab bars - tub/shower;Grab bars - toilet      Prior Function Level of Independence: Independent with assistive device(s)         Comments: Uses rollator for mobility, multiple falls, does own ADLs. Does not drive. Children assists with meals, grocery shopping. Aide assists for 3 days /week for 3-4 hours. Was getting HHPT PTA 1-2 days/week     Hand Dominance   Dominant Hand: Right    Extremity/Trunk Assessment   Upper Extremity Assessment Upper Extremity Assessment: Defer to OT evaluation    Lower Extremity Assessment Lower Extremity Assessment: RLE deficits/detail;LLE deficits/detail (Grossly ~5/5 throughout) RLE Sensation: history of peripheral neuropathy (distal from knee) LLE Sensation: history of peripheral neuropathy (distal from knee)       Communication   Communication: HOH  Cognition Arousal/Alertness: Awake/alert Behavior During Therapy: WFL for tasks assessed/performed Overall Cognitive Status: Difficult to assess                                 General Comments: appears [facility] for basic mobility tasks. Follows commands with repetition likely due to [facility]. Impaired memory noted and recall.      General Comments General comments (skin integrity, edema, etc.): Daughter present during session to help provide PLOF/history. Discussed possible need for more assist at home from caregiver and family if plan continues to be home.    Exercises     Assessment/Plan    PT Assessment Patient needs continued PT services  PT Problem List Decreased mobility;Impaired sensation;Decreased balance;Decreased activity tolerance       PT Treatment Interventions Therapeutic exercise;Gait training;Patient/family  education;Therapeutic activities;Functional mobility training;DME instruction;Balance training;Stair training    PT Goals (Current goals can be found in the Care Plan section)  Acute Rehab PT Goals Patient Stated Goal: to get better; per daughter to get pt home PT Goal Formulation: With patient/family Time For Goal Achievement: 10/15/20 Potential to Achieve Goals: Good    Frequency Min 3X/week   Barriers to discharge Decreased caregiver support      Co-evaluation               AM-PAC PT "6 Clicks" Mobility  Outcome Measure Help needed turning from your back to your side while in a flat bed without using bedrails?: A Little Help needed moving from lying on your back to sitting on the side of a flat bed without using bedrails?: A Little Help needed moving to and from a bed to a chair (including a wheelchair)?: A Lot Help needed standing up from a chair using your arms (e.g., wheelchair or bedside chair)?: A Lot Help needed to walk in hospital room?: A Lot Help needed climbing 3-5 steps with a railing? : A Lot 6 Click Score: 14    End of Session Equipment Utilized During Treatment: Gait belt Activity Tolerance: Treatment limited secondary to medical complications (Comment) (dizziness) Patient left: in chair;with call bell/phone within reach;with family/visitor present Nurse Communication: Mobility status;Other (comment) (transfer technique) PT Visit Diagnosis: Unsteadiness on feet (R26.81);Dizziness and giddiness (R42);Difficulty in walking,  not elsewhere classified (R26.2);History of falling (Z91.81)    Time: 2035-5974 PT Time Calculation (min) (ACUTE ONLY): 39 min   Charges:   PT Evaluation $PT Eval Moderate Complexity: 1 Mod PT Treatments $Therapeutic Activity: 23-37 mins        Marisa Severin, PT, DPT Acute Rehabilitation Services Pager [phone] Office [phone]     Marguarite Arbour A Sabra Heck 10/01/2020, 1:26 PM

## 2020-10-01 NOTE — Progress Notes (Signed)
Neurology Progress Note  Brief HPI: 87 year old male with PMHx of AF on warfarin, HTN, HLD, Dm, OSA, diffuse non-Hodgkin's lymphona of the testes (2007 s/p radiation and chemo), CKD III, COPD, RLS, and chronic indwelling suprapubic catheter who initially presented to [facility] 9/22 for evaluation of subacute decline over the past few months with ongoing complaints of headache, weight loss, daily retching up to 15 times daily, intermittent vertical diplopia, shuffling gait, increasing falls, 1 month of nystagmus, right facial droop with onset a couple of weeks prior to arrival, and progressive hearing loss.  MRI brain imaging was obtained revealing abnormal signal involving the dorsal brainstem with possible minor mass-effect.  Postcontrast MRI revealed a masslike enhancement in the region of the cerebral aqueduct and nodular enhancement along the ventral aspect of the fourth ventricle and multiple cranial nerves concerning for leptomeningeal spread of malignancy. Pending LP when INR is < 1.5  Subjective: No acute overnight events Son at bedside reports that Gary Yates nausea has improved with medications but that he has persistent headaches and his hearing loss seems to be worse  INR this morning remains elevated at 2.2  Exam: Vitals:   10/01/20 0400 10/01/20 0817  BP: (!) 147/88 127/69  Pulse: 73 85  Resp: 19 18  Temp: 98.1 F (36.7 C) 98.1 F (36.7 C)  SpO2: 98% 99%   Gen: Laying comfortably in bed, awake, in no acute distress Resp: non-labored breathing, no respiratory distress on 2L [place] with SpO2 98% on monitor Abd: soft, non-tender, non-distended  Neuro: Mental Status: Awake, alert, and oriented to self, place, time, and situation.  Patient is able to give some details regarding his history of present illness however he often minimizes his symptoms or omits significant details.  Patient follows commands without difficulty. There are no signs of aphasia or neglect. Speech is mildly  dysarthric. Cranial Nerves: PERRL, visual fields are full, with eye opening, there is clear left lateropulsion followed by right beating saccades, worse in lateral gaze.  Sensation to face is intact and symmetric to light touch, there is a right facial droop, patient is hard of hearing, palate elevates symmetrically, phonation is intact, shoulder shrug is symmetric, tongue protrudes midline. Motor: 5/5 strength bilateral upper and lower extremities.  No tremor noted. Tone and bulk were normal. Sensory: Intact and symmetric to light touch throughout. DTR: 2+ and symmetric patellae and biceps. Plantars: Toes mute bilaterally Gait: Deferred  Pertinent Labs: CBC    Component Value Date/Time   WBC 7.1 10/01/2020 0058   RBC 3.49 (L) 10/01/2020 0058   HGB 11.1 (L) 10/01/2020 0058   HGB 10.3 (L) 09/14/2020 1205   HGB 10.5 (L) 01/10/2017 1136   HCT 33.2 (L) 10/01/2020 0058   HCT 31.5 (L) 01/10/2017 1136   PLT 125 (L) 10/01/2020 0058   PLT 155 09/14/2020 1205   PLT 123 (L) 01/10/2017 1136   MCV 95.1 10/01/2020 0058   MCV 95 01/10/2017 1136   MCH 31.8 10/01/2020 0058   MCHC 33.4 10/01/2020 0058   RDW 15.6 (H) 10/01/2020 0058   RDW 14.2 01/10/2017 1136   LYMPHSABS 1.1 09/28/2020 0935   LYMPHSABS 0.9 01/10/2017 1136   MONOABS 0.4 09/28/2020 0935   EOSABS 0.1 09/28/2020 0935   EOSABS 0.1 01/10/2017 1136   BASOSABS 0.1 09/28/2020 0935   BASOSABS 0.0 01/10/2017 1136   CMP     Component Value Date/Time   NA 136 10/01/2020 0058   NA 137 08/18/2019 0000   NA 141 01/10/2017 1136  NA 137 12/14/2015 1100   K 4.0 10/01/2020 0058   K 4.4 01/10/2017 1136   K 3.7 12/14/2015 1100   CL 102 10/01/2020 0058   CL 104 01/10/2017 1136   CO2 25 10/01/2020 0058   CO2 27 01/10/2017 1136   CO2 24 12/14/2015 1100   GLUCOSE 104 (H) 10/01/2020 0058   GLUCOSE 132 (H) 01/10/2017 1136   BUN 18 10/01/2020 0058   BUN 29 (A) 08/18/2019 0000   BUN 19 01/10/2017 1136   BUN 18.4 12/14/2015 1100    CREATININE 1.73 (H) 10/01/2020 0058   CREATININE 1.98 (H) 09/14/2020 1205   CREATININE 1.76 (H) 10/08/2019 0100   CREATININE 0.9 12/14/2015 1100   CALCIUM 8.8 (L) 10/01/2020 0058   CALCIUM 9.4 01/10/2017 1136   CALCIUM 9.5 12/14/2015 1100   PROT 6.7 09/28/2020 0935   PROT 6.5 01/10/2017 1136   PROT 6.6 12/14/2015 1100   ALBUMIN 3.7 09/28/2020 0935   ALBUMIN 3.4 01/10/2017 1136   ALBUMIN 3.4 (L) 12/14/2015 1100   AST 14 (L) 09/28/2020 0935   AST 14 (L) 09/14/2020 1205   AST 22 12/14/2015 1100   ALT 7 09/28/2020 0935   ALT 5 09/14/2020 1205   ALT 18 01/10/2017 1136   ALT 10 12/14/2015 1100   ALKPHOS 86 09/28/2020 0935   ALKPHOS 112 (H) 01/10/2017 1136   ALKPHOS 102 12/14/2015 1100   BILITOT 0.7 09/28/2020 0935   BILITOT 0.4 09/14/2020 1205   BILITOT 0.58 12/14/2015 1100   GFRNONAA 38 (L) 10/01/2020 0058   GFRNONAA 32 (L) 09/14/2020 1205   GFRAA 42 (L) 10/11/2019 1525   Lab Results  Component Value Date   TSH 1.771 09/28/2020   Lab Results  Component Value Date   INR 2.2 (H) 10/01/2020   INR 2.6 (H) 09/30/2020   INR 3.0 (H) 09/29/2020   PROTIME 43.2 (H) 10/10/2016   PROTIME 32.4 (H) 08/23/2016   PROTIME 51.6 (H) 07/26/2016   Lab Results  Component Value Date   VITAMINB12 1,214 (H) 09/28/2020   Imaging Reviewed:  MRI brain wo contrast 09/28/2020: Abnormal signal involving the dorsal brainstem with possible minor mass effect. The location accounts for reported intractable vomiting. Differential is wide and includes toxic/metabolic, infectious/post infectious, demyelinating/inflammatory, and neoplastic etiologies. Postcontrast imaging is recommended.  MRI brain w contrast 09/29/2020: Masslike enhancement in the region of the cerebral aqueduct measuring up to 0.9 cm, and nodular enhancement along the ventral aspect of fourth ventricle and multiple cranial nerves as above. 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 Recommend correlation with  lumbar puncture.  Assessment: 87 year old male with history of diffuse non-Hodgkin's lymphoma of the testes 2007 s/p radiation and chemotherapy who presented to the ED for evaluation of subacute decline with poor appetite, weight loss, intractable N/V, shuffling gait, intermittent vertical diplopia, increasing falls, nystagmus, and right facial droop. MRI brain findings concerning for masslike enhancement of the cerebral aqueduct and nodular enhancement along the ventral aspect of the fourth ventricle and multiple cranial nerves suspicious for leptomeningeal spread of malignancy.  - Examination reveals patient with improved nausea with medications, ongoing complaints of a frontal headache, right facial droop, and gaze with left lateropulsion and right beating saccades worse in lateral vision. Family reports that his hearing loss seems to have progressed since hospitalization. - Presentation is most concerning for leptomeningeal spread of malignancy as above with concern for increased ICP with headaches, gait dysfunction, and confusion.  - Will need LP once INR is < 1.5 with  flow cytometry and cytology, opening pressure, cell counts, glucose, protein, bacterial and fungal cultures with collection of extra CSF for further evaluation pending preliminary results.  - Patient's elevated INR was felt to be 2/2 vitamin K deficiency in his diet with impaired PO intake and vomiting. He was not reversed with Vitamin K due to his atrial fibrillation history and potential for hypercoagulability in the setting of malignancy.   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  - Continue holding warfarin for INR normalization and spinal tap - Follow up labs: B6, MMA, Copper, Zinc, NMO IgG - Continue empiric thiamine supplementation 100 mg IV daily and transition to 100 mg PO daily on discharge - Neurology will continue to follow   Anibal Henderson, AGACNP-BC [facility] [phone]   Attending addendum Patient seen and examined No new complaints. No nausea or vomiting at this time Will consider using steroids before LP if he continues to have intractable nausea or vomiting but certainly lymphomas are very steroid responsive and I do not want to miss the opportunity to capture the correct diagnosis. INR 2.2 today. Will wait till INR drops to less than 1.5 and obtain spinal tap. -- Amie Portland, MD Neurologist [facility] Pager: [phone]

## 2020-10-01 NOTE — Progress Notes (Signed)
PROGRESS NOTE        PATIENT DETAILS Name: Gary Yates. Age: 87 y.o. Sex: male Date of Birth: 1933-05-13 Admit Date: 09/28/2020 Admitting Physician Orene Desanctis, DO TMA:[id number], Alinda Sierras, MD  Brief Narrative: Patient is a 87 y.o. 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  See below for further details.  Subjective: Patient in bed, appears comfortable, improved headache, no fever, no chest pain or pressure, no shortness of breath , no abdominal pain. No new focal weakness.   Objective: Vitals: Blood pressure 127/69, pulse 85, temperature 98.1 F (36.7 C), temperature source Oral, resp. rate 18, height 5\' 10"  (1.778 m), weight 90 kg, SpO2 99 %.   Exam:  Awake Alert, No new F.N deficits, Normal affect [place].AT,PERRAL Supple Neck,No JVD, No cervical lymphadenopathy appriciated.  Symmetrical Chest wall movement, Good air movement bilaterally, few rales RRR,No Gallops, Rubs or new Murmurs, No Parasternal Heave +ve B.Sounds, Abd Soft, No tenderness, No organomegaly appriciated, No rebound - guarding or rigidity. No Cyanosis, Clubbing or edema, No new Rash or bruise      9/23>>Urine Culture: Pending  9/22: CT abdomen/pelvis: No acute abnormality-prostatomegaly-no enlarged lymph nodes. 9/22: MRI brain-no contrast: T2 hyperintensity involving dorsal midbrain/pons/medulla 9/23>> MRI brain with contrast: Masslike enhancement of the cerebral aqueduct-nodular enhancement along the ventricle and multiple cranial nerves-suspicious for leptomeningeal spread of malignancy  Assessment/Plan:  Headache with intractable nausea/vomiting/diplopia/nystagmus: Likely due to leptomeningeal involvement of CNS from  underlying malignancy.  Headache better-nausea vomiting currently relatively well controlled with antiemetics.  INR still elevated at 2.6.  Discussed with neurology-continue supportive care-LP with cytology/flow cytometry plan for this coming Monday.  Have sent a epic message to primary oncologist-Dr. Marin Olp as well.  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  AKI on CKD stage IIIa: AKI likely hemodynamically mediated in the setting of nausea/vomiting-improved with supportive care.  PAF: Rate controlled-INR stable at 2.6.  Once < 2-we will place on heparin infusion until LP is completed.    Hypothyroidism: Continue Synthroid  HTN: BP controlled-monitor off antihypertensives.  DM-2 (A1c 6.3 on 7/27): CBG stable-on SSI.  Recent Labs    09/30/20 1648 09/30/20 2037 10/01/20 0819  GLUCAP 107* 135* 118*    Peripheral neuropathy: On Neurontin.  Nutrition Status: Nutrition Problem: Predicted suboptimal nutrient intake Etiology: nausea, vomiting Signs/Symptoms: per patient/family report Interventions: Ensure Enlive (each supplement provides 350kcal and 20 grams of protein), Magic cup, MVI   Estimated body mass index is 28.47 kg/m as calculated from the following:   Height as of this encounter: 5\' 10"  (1.778 m).   Weight as of this encounter: 90 kg.   Procedures: None Consults: Neurology DVT Prophylaxis: Therapeutic INR-Coumadin on hold. Code Status:Full code  Family Communication: son bedside 10/01/20  Time spent: 1 minutes-Greater than 50% of this time was spent in counseling, explanation of diagnosis, planning of further management, and coordination of care.  Diet: Diet Order             Diet full liquid Room service appropriate? Yes; Fluid consistency: Thin  Diet effective now  Disposition Plan: Status is: Inpatient  Remains inpatient appropriate because:Inpatient level of care  appropriate due to severity of illness  Dispo: The patient is from: Home              Anticipated d/c is to: Home              Patient currently is not medically stable to d/c.   Difficult to place patient No     Barriers to Discharge: Intractable headache/nausea/vomiting due to leptomeningeal involvement-lumbar puncture scheduled for this coming Monday.  Needs scheduled antiemetics to control symptoms.  Not yet stable for discharge.  Antimicrobial agents: Anti-infectives (From admission, onward)    None        MEDICATIONS: Scheduled Meds:  atorvastatin  40 mg Oral Daily   Chlorhexidine Gluconate Cloth  6 each Topical Daily   escitalopram  10 mg Oral Daily   feeding supplement  237 mL Oral TID BM   folic acid  2 mg Oral Daily   gabapentin  600 mg Oral BID   insulin aspart  0-5 Units Subcutaneous QHS   insulin aspart  0-9 Units Subcutaneous TID WC   levothyroxine  25 mcg Oral Q0600   LORazepam  0.5 mg Intravenous Once   metoCLOPramide (REGLAN) injection  5 mg Intravenous Q6H   multivitamin with minerals  1 tablet Oral Daily   pantoprazole  40 mg Oral Daily   pramipexole  3 mg Oral QHS   [START ON 10/02/2020] thiamine injection  100 mg Intravenous Q24H   vitamin B-12  1,000 mcg Oral Daily   Continuous Infusions:  lactated ringers 75 mL/hr at 10/01/20 0506   thiamine injection 500 mg (10/01/20 0509)   PRN Meds:.acetaminophen, fentaNYL (SUBLIMAZE) injection, labetalol, ondansetron (ZOFRAN) IV, polyethylene glycol, prochlorperazine   I have personally reviewed following labs and imaging studies  LABORATORY DATA:  Recent Labs  Lab 09/24/20 1841 09/28/20 0935 09/29/20 0435 10/01/20 0058  WBC 4.9 5.2 6.3 7.1  HGB 11.5* 10.7* 11.3* 11.1*  HCT 34.9* 33.2* 34.5* 33.2*  PLT 122* 119* 121* 125*  MCV 95.4 98.2 96.9 95.1  MCH 31.4 31.7 31.7 31.8  MCHC 33.0 32.2 32.8 33.4  RDW 16.3* 16.5* 16.2* 15.6*  LYMPHSABS 0.8 1.1  --   --   MONOABS 0.3 0.4  --   --   EOSABS 0.1  0.1  --   --   BASOSABS 0.1 0.1  --   --     Recent Labs  Lab 09/24/20 1841 09/26/20 0000 09/28/20 0935 09/28/20 0936 09/28/20 1532 09/29/20 0435 09/29/20 0849 09/30/20 0242 09/30/20 0830 10/01/20 0058  NA 137  --  135  --   --  136  --  135  --  136  K 4.4  --  4.3  --   --  4.1  --  4.3  --  4.0  CL 105  --  102  --   --  102  --  102  --  102  CO2 22  --  22  --   --  26  --  26  --  25  GLUCOSE 110*  --  125*  --   --  126*  --  115*  --  104*  BUN 25*  --  37*  --   --  28*  --  20  --  18  CREATININE 2.19*  --  2.62*  --   --  2.03*  --  1.85*  --  1.73*  CALCIUM 9.6  --  9.3  --   --  9.0  --  9.1  --  8.8*  AST  --   --  14*  --   --   --   --   --   --   --   ALT  --   --  7  --   --   --   --   --   --   --   ALKPHOS  --   --  86  --   --   --   --   --   --   --   BILITOT  --   --  0.7  --   --   --   --   --   --   --   ALBUMIN  --   --  3.7  --   --   --   --   --   --   --   INR  --  5.8*  --   --  4.6*  --  3.0*  --  2.6* 2.2*  TSH  --   --   --  1.771  --   --   --   --   --   --          RADIOLOGY STUDIES/RESULTS: No results found.   LOS: 3 days   Signature  Lala Lund M.D on 10/01/2020 at 11:45 AM   -  To page go to [email or web address]

## 2020-10-02 ENCOUNTER — Ambulatory Visit: Payer: Medicare Other | Admitting: Family Medicine

## 2020-10-02 ENCOUNTER — Inpatient Hospital Stay (HOSPITAL_COMMUNITY): Payer: Medicare Other

## 2020-10-02 DIAGNOSIS — R112 Nausea with vomiting, unspecified: Secondary | ICD-10-CM

## 2020-10-02 LAB — GLUCOSE, CAPILLARY
Glucose-Capillary: 121 mg/dL — ABNORMAL HIGH (ref 70–99)
Glucose-Capillary: 166 mg/dL — ABNORMAL HIGH (ref 70–99)
Glucose-Capillary: 112 mg/dL — ABNORMAL HIGH (ref 70–99)
Glucose-Capillary: 128 mg/dL — ABNORMAL HIGH (ref 70–99)

## 2020-10-02 LAB — CBC WITH DIFFERENTIAL/PLATELET
Abs Immature Granulocytes: 0.02 10*3/uL (ref 0.00–0.07)
Basophils Absolute: 0.1 10*3/uL (ref 0.0–0.1)
Basophils Relative: 1 %
Eosinophils Absolute: 0.2 10*3/uL (ref 0.0–0.5)
Eosinophils Relative: 3 %
HCT: 32.1 % — ABNORMAL LOW (ref 39.0–52.0)
Hemoglobin: 10.7 g/dL — ABNORMAL LOW (ref 13.0–17.0)
Immature Granulocytes: 0 %
Lymphocytes Relative: 21 %
Lymphs Abs: 1.3 10*3/uL (ref 0.7–4.0)
MCH: 31.8 pg (ref 26.0–34.0)
MCHC: 33.3 g/dL (ref 30.0–36.0)
MCV: 95.5 fL (ref 80.0–100.0)
Monocytes Absolute: 0.6 10*3/uL (ref 0.1–1.0)
Monocytes Relative: 10 %
Neutro Abs: 4 10*3/uL (ref 1.7–7.7)
Neutrophils Relative %: 65 %
Platelets: 130 10*3/uL — ABNORMAL LOW (ref 150–400)
RBC: 3.36 MIL/uL — ABNORMAL LOW (ref 4.22–5.81)
RDW: 15.6 % — ABNORMAL HIGH (ref 11.5–15.5)
WBC: 6.1 10*3/uL (ref 4.0–10.5)
nRBC: 0 % (ref 0.0–0.2)

## 2020-10-02 LAB — COMPREHENSIVE METABOLIC PANEL
ALT: 6 U/L (ref 0–44)
AST: 14 U/L — ABNORMAL LOW (ref 15–41)
Albumin: 3 g/dL — ABNORMAL LOW (ref 3.5–5.0)
Alkaline Phosphatase: 70 U/L (ref 38–126)
Anion gap: 9 (ref 5–15)
BUN: 21 mg/dL (ref 8–23)
CO2: 27 mmol/L (ref 22–32)
Calcium: 8.8 mg/dL — ABNORMAL LOW (ref 8.9–10.3)
Chloride: 97 mmol/L — ABNORMAL LOW (ref 98–111)
Creatinine, Ser: 1.85 mg/dL — ABNORMAL HIGH (ref 0.61–1.24)
GFR, Estimated: 35 mL/min — ABNORMAL LOW (ref >60)
Glucose, Bld: 120 mg/dL — ABNORMAL HIGH (ref 70–99)
Potassium: 3.7 mmol/L (ref 3.5–5.1)
Sodium: 133 mmol/L — ABNORMAL LOW (ref 135–145)
Total Bilirubin: 0.8 mg/dL (ref 0.3–1.2)
Total Protein: 5.7 g/dL — ABNORMAL LOW (ref 6.5–8.1)

## 2020-10-02 LAB — MAGNESIUM: Magnesium: 1.3 mg/dL — ABNORMAL LOW (ref 1.7–2.4)

## 2020-10-02 LAB — VITAMIN B1: Vitamin B1 (Thiamine): 90.8 nmol/L (ref 66.5–200.0)

## 2020-10-02 LAB — NEUROMYELITIS OPTICA AUTOAB, IGG: NMO-IgG: <1.5 U/mL (ref 0.0–3.0)

## 2020-10-02 LAB — BRAIN NATRIURETIC PEPTIDE: B Natriuretic Peptide: 420.3 pg/mL — ABNORMAL HIGH (ref 0.0–100.0)

## 2020-10-02 LAB — METHYLMALONIC ACID, SERUM: Methylmalonic Acid, Quantitative: 226 nmol/L (ref 0–378)

## 2020-10-02 LAB — PROTIME-INR
INR: 1.6 — ABNORMAL HIGH (ref 0.8–1.2)
Prothrombin Time: 18.7 seconds — ABNORMAL HIGH (ref 11.4–15.2)

## 2020-10-02 MED ORDER — MAGNESIUM SULFATE IN D5W 1-5 GM/100ML-% IV SOLN
1.0000 g | Freq: Once | INTRAVENOUS | Status: AC
  Administered 2020-10-02: 1 g via INTRAVENOUS
  Filled 2020-10-02: qty 100

## 2020-10-02 MED ORDER — MAGNESIUM SULFATE 2 GM/50ML IV SOLN
2.0000 g | Freq: Once | INTRAVENOUS | Status: AC
  Administered 2020-10-02: 2 g via INTRAVENOUS
  Filled 2020-10-02: qty 50

## 2020-10-02 NOTE — Progress Notes (Signed)
PROGRESS NOTE        PATIENT DETAILS Name: Gary Yates. Age: 87 y.o. Sex: male Date of Birth: 12/07/1933 Admit Date: 09/28/2020 Admitting Physician Orene Desanctis, DO XAJ:[id number], Alinda Sierras, MD  Brief Narrative: Patient is a 87 y.o. 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  See below for further details.  Subjective: Patient in bed, appears comfortable, mild headache, no fever, no chest pain or pressure, no shortness of breath , no abdominal pain. No new focal weakness.    Objective: Vitals: Blood pressure (!) 122/58, pulse 77, temperature 97.6 F (36.4 C), temperature source Oral, resp. rate 17, height 5\' 10"  (1.778 m), weight 90 kg, SpO2 95 %.   Exam:  Awake Alert, No new F.N deficits, Normal affect [place].AT,PERRAL Supple Neck,No JVD, No cervical lymphadenopathy appriciated.  Symmetrical Chest wall movement, Good air movement bilaterally, CTAB RRR,No Gallops, Rubs or new Murmurs, No Parasternal Heave +ve B.Sounds, Abd Soft, No tenderness, No organomegaly appriciated, No rebound - guarding or rigidity. No Cyanosis, Clubbing or edema, No new Rash or bruise       9/23>>Urine Culture: Pending  9/22: CT abdomen/pelvis: No acute abnormality-prostatomegaly-no enlarged lymph nodes. 9/22: MRI brain-no contrast: T2 hyperintensity involving dorsal midbrain/pons/medulla 9/23>> MRI brain with contrast: Masslike enhancement of the cerebral aqueduct-nodular enhancement along the ventricle and multiple cranial nerves-suspicious for leptomeningeal spread of malignancy  Assessment/Plan:  Headache with intractable nausea/vomiting/diplopia/nystagmus: Likely due to leptomeningeal involvement of CNS from  underlying malignancy.  Headache better-nausea vomiting currently relatively well controlled with antiemetics.  INR is1.6.  Discussed with neurology-continue supportive care-LP with cytology/flow cytometry plan for this coming Monday/ Tuesday. Oncologist-Dr. Marin Olp following as well, per oncology not a candidate for aggressive treatment in the future.  Patient also very reasonable.  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  AKI on CKD stage IIIa: AKI likely hemodynamically mediated in the setting of nausea/vomiting-improved with supportive care.  PAF with underlying LBBB: Rate controlled-  Hep gtt once LP is done and post 24 hrs.Hold Coumadin until LP is completed.  Hypothyroidism: Continue Synthroid  HTN: BP controlled-monitor off antihypertensives.  DM-2 (A1c 6.3 on 7/27): CBG stable-on SSI.  Recent Labs    10/01/20 2037 10/02/20 0730 10/02/20 1210  GLUCAP 124* 121* 166*    Peripheral neuropathy: On Neurontin.  Nutrition Status: Nutrition Problem: Predicted suboptimal nutrient intake Etiology: nausea, vomiting Signs/Symptoms: per patient/family report Interventions: Ensure Enlive (each supplement provides 350kcal and 20 grams of protein), Magic cup, MVI   Estimated body mass index is 28.47 kg/m as calculated from the following:   Height as of this encounter: 5\' 10"  (1.778 m).   Weight as of this encounter: 90 kg.   Procedures: None Consults: Neurology, Onco DVT Prophylaxis: Therapeutic INR-Coumadin on hold. Code Status:Full code  Family Communication: son bedside 10/01/20, daughter bedside on 10/02/2020  Time spent: 78 minutes-Greater than 50% of this time was spent in counseling, explanation of diagnosis, planning of further management, and coordination of care.  Diet: Diet Order             Diet full liquid Room service appropriate? Yes; Fluid consistency: Thin  Diet  effective now                       Disposition Plan: Status is: Inpatient  Remains inpatient appropriate because:Inpatient level of care appropriate due to severity of illness  Dispo: The patient is from: Home              Anticipated d/c is to: Home              Patient currently is not medically stable to d/c.   Difficult to place patient No     Barriers to Discharge: Intractable headache/nausea/vomiting due to leptomeningeal involvement-lumbar puncture scheduled for this coming Monday.  Needs scheduled antiemetics to control symptoms.  Not yet stable for discharge.  Antimicrobial agents: Anti-infectives (From admission, onward)    None        MEDICATIONS: Scheduled Meds:  atorvastatin  40 mg Oral Daily   Chlorhexidine Gluconate Cloth  6 each Topical Daily   escitalopram  10 mg Oral Daily   feeding supplement  237 mL Oral TID BM   folic acid  2 mg Oral Daily   gabapentin  600 mg Oral BID   insulin aspart  0-5 Units Subcutaneous QHS   insulin aspart  0-9 Units Subcutaneous TID WC   levothyroxine  25 mcg Oral Q0600   LORazepam  0.5 mg Intravenous Once   metoCLOPramide (REGLAN) injection  5 mg Intravenous Q6H   multivitamin with minerals  1 tablet Oral Daily   pantoprazole  40 mg Oral Daily   pramipexole  3 mg Oral QHS   thiamine injection  100 mg Intravenous Q24H   vitamin B-12  1,000 mcg Oral Daily   Continuous Infusions:  magnesium sulfate bolus IVPB     PRN Meds:.acetaminophen, fentaNYL (SUBLIMAZE) injection, labetalol, ondansetron (ZOFRAN) IV, polyethylene glycol, prochlorperazine   I have personally reviewed following labs and imaging studies  LABORATORY DATA:  Recent Labs  Lab 09/28/20 0935 09/29/20 0435 10/01/20 0058 10/02/20 0148  WBC 5.2 6.3 7.1 6.1  HGB 10.7* 11.3* 11.1* 10.7*  HCT 33.2* 34.5* 33.2* 32.1*  PLT 119* 121* 125* 130*  MCV 98.2 96.9 95.1 95.5  MCH 31.7 31.7 31.8 31.8  MCHC 32.2 32.8 33.4 33.3  RDW 16.5* 16.2* 15.6* 15.6*  LYMPHSABS 1.1  --   --  1.3   MONOABS 0.4  --   --  0.6  EOSABS 0.1  --   --  0.2  BASOSABS 0.1  --   --  0.1    Recent Labs  Lab 09/28/20 0935 09/28/20 0936 09/28/20 1532 09/29/20 0435 09/29/20 0849 09/30/20 0242 09/30/20 0830 10/01/20 0058 10/02/20 0148  NA 135  --   --  136  --  135  --  136 133*  K 4.3  --   --  4.1  --  4.3  --  4.0 3.7  CL 102  --   --  102  --  102  --  102 97*  CO2 22  --   --  26  --  26  --  25 27  GLUCOSE 125*  --   --  126*  --  115*  --  104* 120*  BUN 37*  --   --  28*  --  20  --  18 21  CREATININE 2.62*  --   --  2.03*  --  1.85*  --  1.73* 1.85*  CALCIUM 9.3  --   --  9.0  --  9.1  --  8.8* 8.8*  AST 14*  --   --   --   --   --   --   --  14*  ALT 7  --   --   --   --   --   --   --  6  ALKPHOS 86  --   --   --   --   --   --   --  70  BILITOT 0.7  --   --   --   --   --   --   --  0.8  ALBUMIN 3.7  --   --   --   --   --   --   --  3.0*  MG  --   --   --   --   --   --   --   --  1.3*  INR  --   --  4.6*  --  3.0*  --  2.6* 2.2* 1.6*  TSH  --  1.771  --   --   --   --   --   --   --   BNP  --   --   --   --   --   --   --  470.8* 420.3*         RADIOLOGY STUDIES/RESULTS: DG CHEST PORT 1 VIEW  Result Date: 10/01/2020 CLINICAL DATA:  Shortness of breath EXAM: PORTABLE CHEST 1 VIEW COMPARISON:  August 07, 2019 FINDINGS: The cardiomediastinal silhouette is unchanged in contour.RIGHT chest port tip terminating over the superior cavoatrial junction. RIGHT-sided skinfold. No pleural effusion. No pneumothorax. Unchanged LEFT basilar atelectasis/scar. No acute pleuroparenchymal abnormality. Visualized abdomen is unremarkable. Multilevel degenerative changes of the thoracic spine. IMPRESSION: LEFT basilar atelectasis Electronically Signed   By: Valentino Saxon M.D.   On: 10/01/2020 11:46     LOS: 4 days   Signature  Lala Lund M.D on 10/02/2020 at 12:25 PM   -  To page go to [email or web address]

## 2020-10-02 NOTE — Plan of Care (Signed)

## 2020-10-02 NOTE — Progress Notes (Signed)
Neurology Progress Note  S: No reported concerns from daughter at bedside. Asking about LP. States he feels well. States he has double vision at times. No numbness/tingling. No HA. + retching occasionally with results of just saliva. No vomiting. Says he is weak all over, but not in a particular limb. He is voiding well. Unsure if he had BM. States he sat on commode 2 x but didn't look at results. PT has worked with him. Recommendation is for HHPT.   Dr. Marin Olp (hemoc) saw him this am. "I suspect that this is truly a relapse of his non-Hodgkin's lymphoma. I think that the best option for him would be radiation.  I doubt this is going to cure this but at least it could help with his symptoms and he would be more functional. Again, I think her goal here is going to be his quality of life.  We will see what the spinal tap shows.  Again, I do suspect that he is going to have lymphoma."  O: Current vital signs: BP 123/75 (BP Location: Right Arm)   Pulse 82   Temp 98.6 F (37 C) (Axillary)   Resp 17   Ht 5\' 10"  (1.778 m)   Wt 90 kg   SpO2 95%   BMI 28.47 kg/m  Vital signs in last 24 hours: Temp:  [97.7 F (36.5 C)-98.6 F (37 C)] 98.6 F (37 C) (09/26 0728) Pulse Rate:  [79-89] 82 (09/26 0728) Resp:  [17-19] 17 (09/26 0728) BP: (109-132)/(60-84) 123/75 (09/26 0728) SpO2:  [90 %-95 %] 95 % (09/26 0728)  GENERAL: Fairly well appearing, chronically ill male in NAD. Sitting up in chair. Awake, alert in NAD. HEENT: Normocephalic and atraumatic. LUNGS: Normal respiratory effort.  CV: RRR on tele.  Ext: warm. Psych: Affect light, but concerned.   NEURO:  Mental Status: Alert and oriented x4. Follows commands.   Speech/Language: speech is without aphasia or dysarthria.  Naming, repetition, fluency, and comprehension intact.  Cranial Nerves:  PERRL. Bilateral left gazing nystagmus noted with 4 beats of right sided saccades. This is visible when patient is tracking NP as well.  EOMI.  Eyelids elevate symmetrically. Sensation is intact to light touch and symmetrical to face. Smile is symmetrical. Very HOH.  Phonation is normal.  Motor: Moves all 4 extremities spontaneously and purposefully. Strength is 5/5 throughout.  Sensation- Intact to light touch bilaterally. Extinction absent to DSS.    Gait- deferred.  Medications  Current Facility-Administered Medications:    acetaminophen (TYLENOL) tablet 650 mg, 650 mg, Oral, Q6H PRN, Tu, Ching T, DO, 650 mg at 10/01/20 0837   atorvastatin (LIPITOR) tablet 40 mg, 40 mg, Oral, Daily, Tu, Ching T, DO, 40 mg at 10/01/20 9470   Chlorhexidine Gluconate Cloth 2 % PADS 6 each, 6 each, Topical, Daily, Ghimire, Henreitta Leber, MD, 6 each at 10/01/20 1000   escitalopram (LEXAPRO) tablet 10 mg, 10 mg, Oral, Daily, Tu, Ching T, DO, 10 mg at 10/01/20 0837   feeding supplement (ENSURE ENLIVE / ENSURE PLUS) liquid 237 mL, 237 mL, Oral, TID BM, Tu, Ching T, DO, 237 mL at 10/01/20 1941   fentaNYL (SUBLIMAZE) injection 12.5 mcg, 12.5 mcg, Intravenous, Q6H PRN, Tu, Ching T, DO, 12.5 mcg at 96/28/36 6294   folic acid (FOLVITE) tablet 2 mg, 2 mg, Oral, Daily, Ennever, Rudell Cobb, MD, 2 mg at 10/01/20 0837   gabapentin (NEURONTIN) capsule 600 mg, 600 mg, Oral, BID, Tu, Ching T, DO, 600 mg at 10/01/20 2047   insulin  aspart (novoLOG) injection 0-5 Units, 0-5 Units, Subcutaneous, QHS, Rai, Ripudeep K, MD   insulin aspart (novoLOG) injection 0-9 Units, 0-9 Units, Subcutaneous, TID WC, Rai, Ripudeep K, MD, 1 Units at 10/02/20 0370   labetalol (NORMODYNE) injection 5 mg, 5 mg, Intravenous, Q4H PRN, Tu, Ching T, DO   levothyroxine (SYNTHROID) tablet 25 mcg, 25 mcg, Oral, Q0600, Tu, Ching T, DO, 25 mcg at 10/02/20 0507   LORazepam (ATIVAN) injection 0.5 mg, 0.5 mg, Intravenous, Once, Gareth Morgan, MD   metoCLOPramide (REGLAN) injection 5 mg, 5 mg, Intravenous, Q6H, Rai, Ripudeep K, MD, 5 mg at 10/02/20 0508   multivitamin with minerals tablet 1 tablet, 1 tablet,  Oral, Daily, Tu, Ching T, DO, 1 tablet at 10/01/20 0836   ondansetron (ZOFRAN) injection 4 mg, 4 mg, Intravenous, Q6H PRN, Timothy Lasso, MD, 4 mg at 10/01/20 1401   pantoprazole (PROTONIX) EC tablet 40 mg, 40 mg, Oral, Daily, Tu, Ching T, DO, 40 mg at 10/01/20 4888   polyethylene glycol (MIRALAX / GLYCOLAX) packet 17 g, 17 g, Oral, Daily PRN, Tu, Ching T, DO   pramipexole (MIRAPEX) tablet 3 mg, 3 mg, Oral, QHS, Tu, Ching T, DO, 3 mg at 10/01/20 2048   prochlorperazine (COMPAZINE) injection 5 mg, 5 mg, Intravenous, Q6H PRN, Timothy Lasso, MD, 5 mg at 10/02/20 0159   [COMPLETED] thiamine 500mg  in normal saline (27ml) IVPB, 500 mg, Intravenous, Q8H, Last Rate: 100 mL/hr at 10/01/20 2054, 500 mg at 10/01/20 2054 **FOLLOWED BY** thiamine (B-1) injection 100 mg, 100 mg, Intravenous, Q24H, Bhagat, Srishti L, MD, 100 mg at 10/02/20 9169   vitamin B-12 (CYANOCOBALAMIN) tablet 1,000 mcg, 1,000 mcg, Oral, Daily, Tu, Ching T, DO, 1,000 mcg at 10/01/20 0836  Pertinent Labs INR 1.6. Urine cx NTD.   No new Imaging.  Assessment: 87 year old male with history of diffuse non-Hodgkin's lymphoma of the testes 2007 s/p radiation and chemotherapy. Relapse with chemo 3 years ago. He presented to the ED for evaluation of subacute decline with poor appetite, weight loss, intractable N/V, shuffling gait, intermittent vertical diplopia, increasing falls, nystagmus, and right facial droop. Concern for relapse of lymphoma with brain metastasis.   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 metastasis.  -Nystagmus, ? acutely worsening.  -AF with supra therapeutic INR.   Recommendations/Plan:  -LP probably tomorrow as suspect INR will be < 1.5 then.  -CTH repeat due to ? Worsening nystagmus.  -Continue to hold warfarin for LP and INR normalization.  -f/up labs: B6, MMA, Copper, Zinc, and NMO IgG.  -We will continue to follow.   Pt seen by Clance Boll, MSN, APN-BC/Nurse Practitioner/Neuro and later by MD. Note and plan to be edited as needed by MD.  Pager: [phone]

## 2020-10-02 NOTE — Consult Note (Signed)
Referral MD  Reason for Referral: Likely CNS recurrence of non-Hodgkin's lymphoma  Chief Complaint  Patient presents with   Emesis  : I just do not felt well.  HPI: Gary Yates is well-known to me.  He is an 87 year old white male.  He has a history of large cell non-Hodgkin lymphoma of the right testicle.  He was treated with radiation and chemotherapy.  He then had a relapse.  He was treated with systemic chemotherapy with Rituxan-ICE.  He completed treatment back in 2019.  Has been doing well with this.  He does have other health issues.  He does have atrial fibrillation.  He has an underlying COPD.  He has diabetes.  Recently, he has been declining.  He been having nausea and vomiting.  He has been having abdominal pain.  We saw him in the office a couple weeks ago.  We thought he may have had a urinary tract infection.  He has an chronic indwelling Foley catheter.  We did do a CT of his abdomen and pelvis.  This came back normal.  There is no evidence of recurrent lymphoma in the abdomen/pelvis.  He was admitted on 09/28/2020.  He had nausea and vomiting.  He has some weakness.  He had a little bit of confusion.  Ultimately, he had a MRI of the brain done.  This is highly consistent with relapsed non-Hodgkin's lymphoma.  He is to undergo a spinal tap today.  This is a truly difficult problem now.  He is not a candidate for aggressive therapy with respect to high-dose methotrexate.  I think the only option that we would have if he is truly non-Hodgkin's lymphoma is radiation therapy.  He is hard of hearing.  I am unsure if this might be from his CNS issues.  He is on chronic Coumadin for atrial fibrillation.  His labs today show white cell count of 6.1.  Hemoglobin 10.7.  Platelet count 130,000.  BUN is 21 creatinine 1.85.  Calcium is 8.8 with an albumin of 3.0.  I would have to say that overall, his performance status is ECOG 3.    Past Medical History:  Diagnosis Date   Anemia  in chronic renal disease 05/07/2017   Anxiety    Atrial fibrillation (HCC)    COPD (chronic obstructive pulmonary disease) ([place])    pt. denies   Coronary artery disease    a. h/o Overlapping stents RCA;  b. 06/2011 Cath: patent stents, nonobs dzs, NL EF.   Diabetic peripheral neuropathy (HCC)    Diffuse non-Hodgkin's lymphoma of testis ([place]) 09/28/2015   DM (diabetes mellitus) ([place])    Type 2, peripheral neuropathy.   Dyspnea    with exertion   Dysrhythmia    GERD (gastroesophageal reflux disease)    Headache    History of bronchitis    History of kidney stones    History of radiation therapy 02/19/16 - 03/13/16   Testis/Scrotum: 32.4 Gy in 18 fractions   History of radiation therapy 08/07/16-08/20/16   left adrenal gland mass treated to 30 Gy in 10 fractions   Hyperlipidemia    Hypertension    Iron deficiency anemia due to chronic blood loss 08/08/2017   Low testosterone    Nephrolithiasis    OSA (obstructive sleep apnea) 11/26/2017   Osteoarthritis    shoulder   Restless leg    SVT (supraventricular tachycardia) (HCC)    Urinary frequency    Wears partial dentures    upper and lower  :  Past Surgical History:  Procedure Laterality Date   [place]  01/2013   CATARACT EXTRACTION, BILATERAL     CHOLECYSTECTOMY     COLONOSCOPY     CORONARY ANGIOPLASTY  2004   CYSTOSCOPY N/A 08/18/2017   Procedure: CYSTOSCOPY WITH FULGURATION AND SUPRA PUBIC TUBE PLACEMENT;  Surgeon: Kathie Rhodes, MD;  Location: [facility];  Service: Urology;  Laterality: N/A;   EYE SURGERY Bilateral    cataracts   IR GENERIC HISTORICAL  10/05/2015   IR US GUIDE VASC ACCESS RIGHT 10/05/2015 Marybelle Killings, MD [facility]   IR GENERIC HISTORICAL  10/05/2015   IR FLUORO GUIDE PORT INSERTION RIGHT 10/05/2015 Marybelle Killings, MD [facility]   LEFT HEART CATHETERIZATION WITH CORONARY ANGIOGRAM N/A 06/18/2011   Procedure: LEFT HEART CATHETERIZATION WITH CORONARY  ANGIOGRAM;  Surgeon: Honora Searson M Martin[place], MD;  Location: [facility] CATH LAB;  Service: Cardiovascular;  Laterality: N/A;   LEFT HEART CATHETERIZATION WITH CORONARY ANGIOGRAM N/A 01/27/2013   Procedure: LEFT HEART CATHETERIZATION WITH CORONARY ANGIOGRAM;  Surgeon: Burnell Blanks, MD;  Location: [facility] CATH LAB;  Service: Cardiovascular;  Laterality: N/A;   LUMBAR LAMINECTOMY/DECOMPRESSION MICRODISCECTOMY N/A 02/07/2015   Procedure: Lumbar three-Sacral one Decompression;  Surgeon: Kevan Ny Ditty, MD;  Location: [place] NEURO ORS;  Service: Neurosurgery;  Laterality: N/A;  L3 to S1 Decompression   MULTIPLE TOOTH EXTRACTIONS     ORCHIECTOMY Right 09/01/2015   Procedure: RIGHT ORCHIECTOMY;  Surgeon: Kathie Rhodes, MD;  Location: [facility];  Service: Urology;  Laterality: Right;   port a cath in place      ROTATOR CUFF REPAIR Left   :   Current Facility-Administered Medications:    acetaminophen (TYLENOL) tablet 650 mg, 650 mg, Oral, Q6H PRN, Tu, Ching T, DO, 650 mg at 10/01/20 0837   atorvastatin (LIPITOR) tablet 40 mg, 40 mg, Oral, Daily, Tu, Ching T, DO, 40 mg at 10/01/20 7903   Chlorhexidine Gluconate Cloth 2 % PADS 6 each, 6 each, Topical, Daily, Ghimire, Henreitta Leber, MD, 6 each at 10/01/20 1000   escitalopram (LEXAPRO) tablet 10 mg, 10 mg, Oral, Daily, Tu, Ching T, DO, 10 mg at 10/01/20 0837   feeding supplement (ENSURE ENLIVE / ENSURE PLUS) liquid 237 mL, 237 mL, Oral, TID BM, Tu, Ching T, DO, 237 mL at 10/01/20 1941   fentaNYL (SUBLIMAZE) injection 12.5 mcg, 12.5 mcg, Intravenous, Q6H PRN, Tu, Ching T, DO, 12.5 mcg at 83/33/83 2919   folic acid (FOLVITE) tablet 2 mg, 2 mg, Oral, Daily, Donnivan Villena, Rudell Cobb, MD, 2 mg at 10/01/20 1660   gabapentin (NEURONTIN) capsule 600 mg, 600 mg, Oral, BID, Tu, Ching T, DO, 600 mg at 10/01/20 2047   insulin aspart (novoLOG) injection 0-5 Units, 0-5 Units, Subcutaneous, QHS, Rai, Ripudeep K, MD   insulin aspart (novoLOG) injection 0-9 Units, 0-9 Units, Subcutaneous, TID WC,  Rai, Ripudeep K, MD, 2 Units at 10/01/20 1225   labetalol (NORMODYNE) injection 5 mg, 5 mg, Intravenous, Q4H PRN, Tu, Ching T, DO   levothyroxine (SYNTHROID) tablet 25 mcg, 25 mcg, Oral, Q0600, Tu, Ching T, DO, 25 mcg at 10/02/20 0507   LORazepam (ATIVAN) injection 0.5 mg, 0.5 mg, Intravenous, Once, Gareth Morgan, MD   metoCLOPramide (REGLAN) injection 5 mg, 5 mg, Intravenous, Q6H, Rai, Ripudeep K, MD, 5 mg at 10/02/20 0508   multivitamin with minerals tablet 1 tablet, 1 tablet, Oral, Daily, Tu, Ching T, DO, 1 tablet at 10/01/20 [phone]  ondansetron (ZOFRAN) injection 4 mg, 4 mg, Intravenous, Q6H PRN, Timothy Lasso, MD, 4 mg at 10/01/20 1401   pantoprazole (PROTONIX) EC tablet 40 mg, 40 mg, Oral, Daily, Tu, Ching T, DO, 40 mg at 10/01/20 1610   polyethylene glycol (MIRALAX / GLYCOLAX) packet 17 g, 17 g, Oral, Daily PRN, Tu, Ching T, DO   pramipexole (MIRAPEX) tablet 3 mg, 3 mg, Oral, QHS, Tu, Ching T, DO, 3 mg at 10/01/20 2048   prochlorperazine (COMPAZINE) injection 5 mg, 5 mg, Intravenous, Q6H PRN, Timothy Lasso, MD, 5 mg at 10/02/20 0159   [COMPLETED] thiamine 500mg  in normal saline (4ml) IVPB, 500 mg, Intravenous, Q8H, Last Rate: 100 mL/hr at 10/01/20 2054, 500 mg at 10/01/20 2054 **FOLLOWED BY** thiamine (B-1) injection 100 mg, 100 mg, Intravenous, Q24H, Bhagat, Srishti L, MD, 100 mg at 10/02/20 9604   vitamin B-12 (CYANOCOBALAMIN) tablet 1,000 mcg, 1,000 mcg, Oral, Daily, Tu, Ching T, DO, 1,000 mcg at 10/01/20 5409:   atorvastatin  40 mg Oral Daily   Chlorhexidine Gluconate Cloth  6 each Topical Daily   escitalopram  10 mg Oral Daily   feeding supplement  237 mL Oral TID BM   folic acid  2 mg Oral Daily   gabapentin  600 mg Oral BID   insulin aspart  0-5 Units Subcutaneous QHS   insulin aspart  0-9 Units Subcutaneous TID WC   levothyroxine  25 mcg Oral Q0600   LORazepam  0.5 mg Intravenous Once   metoCLOPramide (REGLAN) injection  5 mg Intravenous Q6H   multivitamin with  minerals  1 tablet Oral Daily   pantoprazole  40 mg Oral Daily   pramipexole  3 mg Oral QHS   thiamine injection  100 mg Intravenous Q24H   vitamin B-12  1,000 mcg Oral Daily  :   Allergies  Allergen Reactions   Latex Other (See Comments)    Reddens the skin   Ace Inhibitors Cough   Codeine Nausea Only and Rash        Compazine [Prochlorperazine] Anxiety   Penicillins Rash    Childhood allergy Has patient had a PCN reaction causing immediate rash, facial/tongue/throat swelling, SOB or lightheadedness with hypotension: Yes Has patient had a PCN reaction causing severe rash involving mucus membranes or skin necrosis: Yes Has patient had a PCN reaction that required hospitalization No Has patient had a PCN reaction occurring within the last 10 years: No If all of the above answers are "NO", then may proceed with Cephalosporin use.   :   Family History  Problem Relation Age of Onset   Alzheimer's disease Mother    Heart disease Mother    Heart disease Father    Migraines Father    Ulcers Father    Prostate cancer Brother    Coronary artery disease Other        Male 1st degree relative <50   Coronary artery disease Other        male 1st degree relative <60   Heart disease Sister    Obesity Sister        Morbid   Arthritis Sister    Heart disease Brother    Arthritis Brother    Sleep apnea Son    Obesity Son    Migraines Daughter    Thyroid disease Daughter   :   Social History   Socioeconomic History   Marital status: Married    Spouse name: Not on file   Number of children: 3   Years  of education: Not on file   Highest education level: Not on file  Occupational History   Occupation: Retired    Fish farm manager: RETIRED  Tobacco Use   Smoking status: Former    Packs/day: 1.50    Years: 20.00    Pack years: 30.00    Types: Cigarettes    Quit date: 04/05/1978    Years since quitting: 42.5   Smokeless tobacco: Never  Vaping Use   Vaping Use: Never used   Substance and Sexual Activity   Alcohol use: No   Drug use: No   Sexual activity: Yes    Birth control/protection: None    Comment: Married  Other Topics Concern   Not on file  Social History Narrative   Not on file   Social Determinants of Health   Financial Resource Strain: Low Risk    Difficulty of Paying Living Expenses: Not hard at all  Food Insecurity: No Food Insecurity   Worried About Charity fundraiser in the Last Year: Never true   [place] in the Last Year: Never true  Transportation Needs: No Transportation Needs   Lack of Transportation (Medical): No   Lack of Transportation (Non-Medical): No  Physical Activity: Insufficiently Active   Days of Exercise per Week: 2 days   Minutes of Exercise per Session: 30 min  Stress: No Stress Concern Present   Feeling of Stress : Not at all  Social Connections: Moderately Integrated   Frequency of Communication with Friends and Family: More than three times a week   Frequency of Social Gatherings with Friends and Family: More than three times a week   Attends Religious Services: More than 4 times per year   Active Member of [employer] or Organizations: No   Attends Archivist Meetings: Never   Marital Status: Married  Human resources officer Violence: Not At Risk   Fear of Current or Ex-Partner: No   Emotionally Abused: No   Physically Abused: No   Sexually Abused: No  :  Review of Systems  Constitutional:  Positive for malaise/fatigue.  HENT:  Positive for hearing loss.   Eyes: Negative.   Respiratory:  Positive for shortness of breath.   Cardiovascular:  Positive for palpitations and leg swelling.  Gastrointestinal:  Positive for heartburn, nausea and vomiting.  Genitourinary: Negative.   Musculoskeletal:  Positive for back pain.  Skin: Negative.   Neurological:  Positive for headaches.  Endo/Heme/Allergies: Negative.   Psychiatric/Behavioral: Negative.      Exam: Patient Vitals for the past 24 hrs:   BP Temp Temp src Pulse Resp SpO2  10/02/20 0352 129/77 98 F (36.7 C) Oral 89 19 90 %  10/01/20 2323 115/77 98 F (36.7 C) Oral 79 18 92 %  10/01/20 1954 123/72 98 F (36.7 C) Oral 80 18 94 %  10/01/20 1612 132/84 97.7 F (36.5 C) Oral -- 18 --  10/01/20 1215 109/60 98 F (36.7 C) Oral -- 17 --  10/01/20 0817 127/69 98.1 F (36.7 C) Oral 85 18 99 %   This is an elderly white male in no obvious distress.  He is hard of hearing.  His vital signs show temperature of 98.  Pulse 89.  Blood pressure 129/77.  His head and neck exam shows no ocular or oral lesions.  His pupils react.  He has no adenopathy in the neck.  Lungs are clear.  Cardiac exam irregular rate and rhythm consistent with atrial fibrillation.  Abdomen is soft.  Bowel  sounds are decreased.  There is no guarding or rebound tenderness.  There is no palpable liver or spleen tip.  Extremities shows chronic 2+ edema in his legs.  Neurological exam shows no focal deficits.   Recent Labs    10/01/20 0058 10/02/20 0148  WBC 7.1 6.1  HGB 11.1* 10.7*  HCT 33.2* 32.1*  PLT 125* 130*    Recent Labs    10/01/20 0058 10/02/20 0148  NA 136 133*  K 4.0 3.7  CL 102 97*  CO2 25 27  GLUCOSE 104* 120*  BUN 18 21  CREATININE 1.73* 1.85*  CALCIUM 8.8* 8.8*    Blood smear review: None  Pathology: None    Assessment and Plan: Gary Yates is an 87 year old white male.  I suspect that this is truly a relapse of his non-Hodgkin's lymphoma.  It has been over 3 years since he has had a relapse.  I just hate this for him.  Again, our options are can be extremely limited given his overall performance status.  Again he is not a candidate for high-dose methotrexate for CNS relapse.  I think that the best option for him would be radiation.  I doubt this is going to cure this but at least it could help with his symptoms and he would be more functional.  I have known Gary Yates for a long time.  I know that his wife passed away earlier this year.   This has been tough on him.  He is not afraid to pass on.  He knows where he is going.  He knows that he will be with his wife.  Again, I think her goal here is going to be his quality of life.  We will see what the spinal tap shows.  Again, I do suspect that he is going to have lymphoma.  I would even be hesitant to think about intrathecal therapy for him.  He is on chronic Coumadin.  I think if intrathecal therapy would ever be considered, he would need an [place] reservoir.  Again, this is a very difficult situation.  Again we have to focus on his quality of life.  We will continue to follow along and see what the spinal tap shows.  I will talk to his family.  I will talk to him about CODE STATUS.  I am sure he will have absolutely no problems with a DNR status.  I do thank the staff up on [address].  I know that they are doing a great job with him.  I appreciate all of their hard work.  Lattie Haw, MD  Colossians 3:2

## 2020-10-02 NOTE — Progress Notes (Signed)
OT EVALUATIO Pt.was seen to assess for needs. Pt. Has decreased ability to care for himself with adls and mobility. Pt. Would benefit from continued OT. Pt  dtr ed on compression sock donner and tub transfer bench to assist with ADLs at home. Acute ot to follow.   10/02/20 1200  OT Visit Information  Last OT Received On 10/02/20  Assistance Needed +1  History of Present Illness [name] who presents on 09/28/20 with several week hx of dizziness, headache, N/V. 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 Plan for LP with cytology/flow cytometry on Monday, 9/26. PMH includes A-fib, COPD, CAD, non-Hodgkin's lymphoma in remission, HTN, OSA, DM.  Precautions  Precautions Fall  Precaution Comments hx of falls  Required Braces or Orthoses  (3 to 4 falls in the past 6 months)  Restrictions  Weight Bearing Restrictions No  Home Living  Family/patient expects to be discharged to: Private residence  Living Arrangements Alone;Non-relatives/Friends  Available Help at Discharge Family;Available PRN/intermittently  Type of Home House  Home Access Stairs to enter  Entrance Stairs-Number of Steps 2  Entrance Stairs-Rails Right  Home Layout Able to live on main level with bedroom/bathroom;Multi-level;Laundry or work area in basement  Alternate Level Stairs-Rails Right  Bathroom Shower/Tub Tub/shower unit  Amity bars - toilet;Grab bars - tub/shower;Walker - 2 wheels  Prior Function  Level of Independence Independent with assistive device(s)  Comments Uses rollator for mobility, multiple falls, does own ADLs. Does not drive. Children assists with meals, grocery shopping. Aide assists for 3 days /week for 3-4 hours. Was getting HHPT PTA 1-2 days/week  Communication  Communication HOH  Pain Assessment   Pain Assessment No/denies pain  Cognition  Arousal/Alertness Awake/alert  Behavior During Therapy WFL for tasks assessed/performed  Overall Cognitive Status Within Functional Limits for tasks assessed  Difficult to assess due to Hard of hearing/deaf  Upper Extremity Assessment  Upper Extremity Assessment Generalized weakness  Lower Extremity Assessment  Lower Extremity Assessment Defer to PT evaluation  ADL  Overall ADL's  Needs assistance/impaired  Eating/Feeding Independent;Sitting  Grooming Wash/dry hands;Wash/dry face;Sitting;Set up  Upper Body Bathing Supervision/ safety;Sitting  Lower Body Bathing Moderate assistance;Sit to/from stand  Upper Body Dressing  Minimal assistance;Sitting  Lower Body Dressing Moderate assistance;Sit to/from stand (Pt. and dtr ed on compression sock donner)  Toilet Transfer Moderate assistance;RW  Toileting- Clothing Manipulation and Hygiene Moderate assistance;Sit to/from stand  Functional mobility during ADLs Moderate assistance;Rolling walker  General ADL Comments Pt. preformed ADLs sitting in recliner  Vision- History  Baseline Vision/History 1 Wears glasses  Ability to See in Adequate Light 1 Impaired  Patient Visual Report  (Pt. reports occasional double vision for the past couple of weeks and per pt. daughter md is aware.)  Vision- Assessment  Vision Assessment? Vision impaired- to be further tested in functional context (no double vision today.)  Transfers  Overall transfer level Needs assistance  Equipment used Rolling walker (2 wheeled)  Transfers Sit to/from Stand  Sit to Stand Mod assist  General transfer comment cues for proper hand placement.  Balance  Sitting balance-Leahy Scale Fair  Standing balance-Leahy Scale Poor  OT - End of Session  Equipment Utilized During Treatment Rolling walker  Activity Tolerance Patient tolerated treatment well  Patient left in chair;with call bell/phone within reach;with family/visitor present   Nurse Communication  (  ok therapy)  OT Assessment  OT Recommendation/Assessment Patient needs continued OT Services  OT Visit Diagnosis Unsteadiness on feet (R26.81);Repeated falls (R29.6);History of falling (Z91.81)  OT Problem List Decreased activity tolerance;Impaired balance (sitting and/or standing);Decreased safety awareness;Decreased knowledge of use of DME or AE;Decreased knowledge of precautions  OT Plan  OT Frequency (ACUTE ONLY) Min 2X/week  OT Treatment/Interventions (ACUTE ONLY) Self-care/ADL training;DME and/or AE instruction;Therapeutic activities;Visual/perceptual remediation/compensation;Balance training  AM-PAC OT "6 Clicks" Daily Activity Outcome Measure (Version 2)  Help from another person eating meals? 4  Help from another person taking care of personal grooming? 3  Help from another person toileting, which includes using toliet, bedpan, or urinal? 2  Help from another person bathing (including washing, rinsing, drying)? 2  Help from another person to put on and taking off regular upper body clothing? 3  Help from another person to put on and taking off regular lower body clothing? 2  6 Click Score 16  Progressive Mobility  What is the highest level of mobility based on the progressive mobility assessment? Level 3 (Stands with assist) - Balance while standing  and cannot march in place  Mobility Out of bed to chair with meals  OT Recommendation  Follow Up Recommendations Home health OT  OT Equipment  (discussed wtih dtr a tub transfer bench.)  Individuals Consulted  Consulted and Agree with Results and Recommendations Patient  Acute Rehab OT Goals  Patient Stated Goal to go home  OT Goal Formulation With patient  Time For Goal Achievement 10/16/20  Potential to Achieve Goals Good  OT Time Calculation  OT Start Time (ACUTE ONLY) 1215  OT Stop Time (ACUTE ONLY) 1243  OT Time Calculation (min) 28 min  OT General Charges  $OT Visit 1 Visit  OT Evaluation  $OT  Eval Moderate Complexity 1 Mod  Written Expression  Dominant Hand Right  Reece Packer OT/L

## 2020-10-02 NOTE — Care Management Important Message (Signed)
Important Message  Patient Details  Name: Gary Yates. MRN: [id number] Date of Birth: Jun 10, 1933   Medicare Important Message Given:  Yes     Antonis, Lor 10/02/2020, 3:45 PM

## 2020-10-03 ENCOUNTER — Ambulatory Visit: Payer: Medicare Other | Admitting: Hematology & Oncology

## 2020-10-03 ENCOUNTER — Other Ambulatory Visit: Payer: Medicare Other

## 2020-10-03 ENCOUNTER — Ambulatory Visit: Payer: Medicare Other

## 2020-10-03 ENCOUNTER — Inpatient Hospital Stay: Payer: Medicare Other

## 2020-10-03 DIAGNOSIS — D696 Thrombocytopenia, unspecified: Secondary | ICD-10-CM

## 2020-10-03 DIAGNOSIS — E1122 Type 2 diabetes mellitus with diabetic chronic kidney disease: Secondary | ICD-10-CM

## 2020-10-03 DIAGNOSIS — R112 Nausea with vomiting, unspecified: Secondary | ICD-10-CM | POA: Diagnosis not present

## 2020-10-03 DIAGNOSIS — D509 Iron deficiency anemia, unspecified: Secondary | ICD-10-CM

## 2020-10-03 DIAGNOSIS — I447 Left bundle-branch block, unspecified: Secondary | ICD-10-CM

## 2020-10-03 DIAGNOSIS — D631 Anemia in chronic kidney disease: Secondary | ICD-10-CM

## 2020-10-03 DIAGNOSIS — I129 Hypertensive chronic kidney disease with stage 1 through stage 4 chronic kidney disease, or unspecified chronic kidney disease: Secondary | ICD-10-CM

## 2020-10-03 DIAGNOSIS — G629 Polyneuropathy, unspecified: Secondary | ICD-10-CM

## 2020-10-03 DIAGNOSIS — Z8572 Personal history of non-Hodgkin lymphomas: Secondary | ICD-10-CM | POA: Diagnosis not present

## 2020-10-03 DIAGNOSIS — E039 Hypothyroidism, unspecified: Secondary | ICD-10-CM

## 2020-10-03 DIAGNOSIS — N189 Chronic kidney disease, unspecified: Secondary | ICD-10-CM

## 2020-10-03 LAB — COMPREHENSIVE METABOLIC PANEL
ALT: 6 U/L (ref 0–44)
AST: 13 U/L — ABNORMAL LOW (ref 15–41)
Albumin: 3 g/dL — ABNORMAL LOW (ref 3.5–5.0)
Alkaline Phosphatase: 76 U/L (ref 38–126)
Anion gap: 7 (ref 5–15)
BUN: 21 mg/dL (ref 8–23)
CO2: 28 mmol/L (ref 22–32)
Calcium: 8.8 mg/dL — ABNORMAL LOW (ref 8.9–10.3)
Chloride: 96 mmol/L — ABNORMAL LOW (ref 98–111)
Creatinine, Ser: 1.81 mg/dL — ABNORMAL HIGH (ref 0.61–1.24)
GFR, Estimated: 36 mL/min — ABNORMAL LOW (ref >60)
Glucose, Bld: 125 mg/dL — ABNORMAL HIGH (ref 70–99)
Potassium: 3.4 mmol/L — ABNORMAL LOW (ref 3.5–5.1)
Sodium: 131 mmol/L — ABNORMAL LOW (ref 135–145)
Total Bilirubin: 1.1 mg/dL (ref 0.3–1.2)
Total Protein: 6 g/dL — ABNORMAL LOW (ref 6.5–8.1)

## 2020-10-03 LAB — GLUCOSE, CAPILLARY
Glucose-Capillary: 125 mg/dL — ABNORMAL HIGH (ref 70–99)
Glucose-Capillary: 141 mg/dL — ABNORMAL HIGH (ref 70–99)
Glucose-Capillary: 162 mg/dL — ABNORMAL HIGH (ref 70–99)
Glucose-Capillary: 255 mg/dL — ABNORMAL HIGH (ref 70–99)

## 2020-10-03 LAB — CBC WITH DIFFERENTIAL/PLATELET
Abs Immature Granulocytes: 0.01 10*3/uL (ref 0.00–0.07)
Basophils Absolute: 0.1 10*3/uL (ref 0.0–0.1)
Basophils Relative: 1 %
Eosinophils Absolute: 0.2 10*3/uL (ref 0.0–0.5)
Eosinophils Relative: 3 %
HCT: 31.8 % — ABNORMAL LOW (ref 39.0–52.0)
Hemoglobin: 10.5 g/dL — ABNORMAL LOW (ref 13.0–17.0)
Immature Granulocytes: 0 %
Lymphocytes Relative: 20 %
Lymphs Abs: 1.1 10*3/uL (ref 0.7–4.0)
MCH: 31.7 pg (ref 26.0–34.0)
MCHC: 33 g/dL (ref 30.0–36.0)
MCV: 96.1 fL (ref 80.0–100.0)
Monocytes Absolute: 0.5 10*3/uL (ref 0.1–1.0)
Monocytes Relative: 9 %
Neutro Abs: 3.7 10*3/uL (ref 1.7–7.7)
Neutrophils Relative %: 67 %
Platelets: 125 10*3/uL — ABNORMAL LOW (ref 150–400)
RBC: 3.31 MIL/uL — ABNORMAL LOW (ref 4.22–5.81)
RDW: 15.1 % (ref 11.5–15.5)
WBC: 5.5 10*3/uL (ref 4.0–10.5)
nRBC: 0 % (ref 0.0–0.2)

## 2020-10-03 LAB — BRAIN NATRIURETIC PEPTIDE: B Natriuretic Peptide: 180.8 pg/mL — ABNORMAL HIGH (ref 0.0–100.0)

## 2020-10-03 LAB — CSF CELL COUNT WITH DIFFERENTIAL
Eosinophils, CSF: 0 % (ref 0–1)
Lymphs, CSF: 90 % — ABNORMAL HIGH (ref 40–80)
Monocyte-Macrophage-Spinal Fluid: 10 % — ABNORMAL LOW (ref 15–45)
RBC Count, CSF: 1 /mm3 — ABNORMAL HIGH
Segmented Neutrophils-CSF: 0 % (ref 0–6)
Tube #: 1
WBC, CSF: 34 /mm3 (ref 0–5)

## 2020-10-03 LAB — PROTIME-INR
INR: 1.3 — ABNORMAL HIGH (ref 0.8–1.2)
Prothrombin Time: 15.7 seconds — ABNORMAL HIGH (ref 11.4–15.2)

## 2020-10-03 LAB — PROTEIN AND GLUCOSE, CSF
Glucose, CSF: 27 mg/dL — CL (ref 40–70)
Total  Protein, CSF: 138 mg/dL — ABNORMAL HIGH (ref 15–45)

## 2020-10-03 LAB — MAGNESIUM: Magnesium: 1.8 mg/dL (ref 1.7–2.4)

## 2020-10-03 MED ORDER — POTASSIUM CHLORIDE CRYS ER 20 MEQ PO TBCR
40.0000 meq | EXTENDED_RELEASE_TABLET | Freq: Once | ORAL | Status: AC
  Administered 2020-10-03: 40 meq via ORAL
  Filled 2020-10-03: qty 2

## 2020-10-03 MED ORDER — BOOST / RESOURCE BREEZE PO LIQD CUSTOM
1.0000 | Freq: Three times a day (TID) | ORAL | Status: DC
  Administered 2020-10-03 – 2020-10-10 (×19): 1 via ORAL
  Filled 2020-10-03: qty 1

## 2020-10-03 MED ORDER — PROSOURCE PLUS PO LIQD
30.0000 mL | Freq: Three times a day (TID) | ORAL | Status: DC
  Administered 2020-10-03 – 2020-10-10 (×20): 30 mL via ORAL
  Filled 2020-10-03 (×21): qty 30

## 2020-10-03 MED ORDER — MELATONIN 5 MG PO TABS
10.0000 mg | ORAL_TABLET | Freq: Every evening | ORAL | Status: DC | PRN
  Administered 2020-10-03 – 2020-10-08 (×4): 10 mg via ORAL
  Filled 2020-10-03 (×4): qty 2

## 2020-10-03 MED ORDER — DEXAMETHASONE SODIUM PHOSPHATE 10 MG/ML IJ SOLN
20.0000 mg | Freq: Every day | INTRAMUSCULAR | Status: DC
  Administered 2020-10-03 – 2020-10-07 (×5): 20 mg via INTRAVENOUS
  Filled 2020-10-03 (×5): qty 2

## 2020-10-03 MED ORDER — MAGNESIUM SULFATE IN D5W 1-5 GM/100ML-% IV SOLN
1.0000 g | Freq: Once | INTRAVENOUS | Status: AC
  Administered 2020-10-03: 1 g via INTRAVENOUS
  Filled 2020-10-03: qty 100

## 2020-10-03 NOTE — Progress Notes (Addendum)
HEMATOLOGY-ONCOLOGY PROGRESS NOTE  SUBJECTIVE: [name] was seen after lunch today.  He was sleeping quietly.  He had been given medication due to nausea and vomiting.  He had a lumbar puncture performed earlier today.  His daughter was at the bedside.  She tells me that he did not sleep very well last evening due to nausea and vomiting.  She tells me that he continues to have headaches.  She also notices some discoordination at times.  She also notices that he is significantly hard of hearing recently.  Oncology History  Anemia in chronic renal disease   REVIEW OF SYSTEMS:   Obtained from daughter-as noted in the HPI  I have reviewed the past medical history, past surgical history, social history and family history with the patient and they are unchanged from previous note.   PHYSICAL EXAMINATION: ECOG PERFORMANCE STATUS: 3 - Symptomatic, >50% confined to bed  Vitals:   10/03/20 0736 10/03/20 1152  BP: 130/74 131/88  Pulse: 71 71  Resp: 17 17  Temp: 98.5 F (36.9 C) 98.7 F (37.1 C)  SpO2: 94% 92%   Filed Weights   09/29/20 1600  Weight: 90 kg    Intake/Output from previous day: 09/26 0701 - 09/27 0700 In: 205 [P.O.:105; IV Piggyback:100] Out: 800 [Urine:800]  GENERAL: Sleeping quietly, comfortable SKIN: skin color, texture, turgor are normal, no rashes or significant lesions LUNGS: clear to auscultation and percussion with normal breathing effort HEART: Irregular, lower extremity edema present ABDOMEN:abdomen soft, non-tender and normal bowel sounds NEURO: Sleeping quietly, I did not awaken him  LABORATORY DATA:  I have reviewed the data as listed CMP Latest Ref Rng & Units 10/03/2020 10/02/2020 10/01/2020  Glucose 70 - 99 mg/dL 125(H) 120(H) 104(H)  BUN 8 - 23 mg/dL 21 21 18   Creatinine 0.61 - 1.24 mg/dL 1.81(H) 1.85(H) 1.73(H)  Sodium 135 - 145 mmol/L 131(L) 133(L) 136  Potassium 3.5 - 5.1 mmol/L 3.4(L) 3.7 4.0  Chloride 98 - 111 mmol/L 96(L) 97(L) 102  CO2 22 - 32  mmol/L 28 27 25   Calcium 8.9 - 10.3 mg/dL 8.8(L) 8.8(L) 8.8(L)  Total Protein 6.5 - 8.1 g/dL 6.0(L) 5.7(L) -  Total Bilirubin 0.3 - 1.2 mg/dL 1.1 0.8 -  Alkaline Phos 38 - 126 U/L 76 70 -  AST 15 - 41 U/L 13(L) 14(L) -  ALT 0 - 44 U/L 6 6 -    Lab Results  Component Value Date   WBC 5.5 10/03/2020   HGB 10.5 (L) 10/03/2020   HCT 31.8 (L) 10/03/2020   MCV 96.1 10/03/2020   PLT 125 (L) 10/03/2020   NEUTROABS 3.7 10/03/2020    CT ABDOMEN PELVIS WO CONTRAST  Result Date: 09/28/2020 CLINICAL DATA:  Nausea, vomiting, emesis EXAM: CT ABDOMEN AND PELVIS WITHOUT CONTRAST TECHNIQUE: Multidetector CT imaging of the abdomen and pelvis was performed following the standard protocol without IV contrast. COMPARISON:  07/23/2019 FINDINGS: Lower chest: No acute abnormality. Cardiomegaly. Coronary artery calcifications. Hepatobiliary: No focal liver abnormality is seen. Status post cholecystectomy. No biliary dilatation. Pancreas: Unremarkable. No pancreatic ductal dilatation or surrounding inflammatory changes. Spleen: Normal in size without significant abnormality. Adrenals/Urinary Tract: Adrenal glands are unremarkable. Nine bilateral renal cysts, including a high attenuation hemorrhagic or proteinaceous cyst of the posterior midportion of the right kidney. Kidneys are otherwise normal, without renal calculi, solid lesion, or hydronephrosis. Suprapubic catheter. Stomach/Bowel: Stomach is within normal limits. Appendix appears normal. No evidence of bowel wall thickening, distention, or inflammatory changes. Vascular/Lymphatic: Aortic atherosclerosis. No  enlarged abdominal or pelvic lymph nodes. Reproductive: Prostatomegaly. Other: No abdominal wall hernia or abnormality. No abdominopelvic ascites. Musculoskeletal: No acute or significant osseous findings. IMPRESSION: 1. No acute noncontrast CT findings of the abdomen or pelvis to explain nausea or vomiting. 2. Suprapubic catheter. 3. Prostatomegaly. 4. Coronary  artery disease. Aortic Atherosclerosis (ICD10-I70.0). Electronically Signed   By: Eddie Candle M.D.   On: 09/28/2020 11:14   CT HEAD WO CONTRAST (5MM)  Result Date: 10/02/2020 CLINICAL DATA:  Nystagmus, possible lymphoma of brain EXAM: CT HEAD WITHOUT CONTRAST TECHNIQUE: Contiguous axial images were obtained from the base of the skull through the vertex without intravenous contrast. COMPARISON:  09/24/2020, 09/29/2020 FINDINGS: Brain: No acute infarct or hemorrhage. Lateral ventricles and midline structures are stable. 8 mm hyperdense area in the region of the cerebral aqueduct just inferior to the calcified pineal gland corresponds to the abnormal area of enhancement on recent MRI. No acute extra-axial fluid collections. No mass effect. Vascular: Stable atherosclerosis.  No hyperdense vessel. Skull: Normal. Negative for fracture or focal lesion. Sinuses/Orbits: No acute finding. Other: None. IMPRESSION: 1. No acute infarct or hemorrhage. 2. Subtle hyperdense area in the region of the cerebral aqueduct corresponds to the enhancing lesion seen on recent MRI, suspicious for malignancy. Electronically Signed   By: Randa Ngo M.D.   On: 10/02/2020 22:36   CT Head Wo Contrast  Result Date: 09/24/2020 CLINICAL DATA:  Headache.  Double vision. EXAM: CT HEAD WITHOUT CONTRAST TECHNIQUE: Contiguous axial images were obtained from the base of the skull through the vertex without intravenous contrast. COMPARISON:  CT head 07/23/2019. FINDINGS: Brain: No evidence of acute infarction, hemorrhage, hydrocephalus, extra-axial collection or mass lesion/mass effect. Again seen is mild diffuse atrophy, unchanged. There is stable mild patchy periventricular and deep white matter hypodensity, likely chronic small vessel ischemic change. Small calcified meningioma measuring 7 mm overlying the right tentorium appears unchanged from the prior exam. Vascular: Atherosclerotic calcifications are present within the cavernous  internal carotid arteries. Skull: Normal. Negative for fracture or focal lesion. Sinuses/Orbits: No acute finding. Other: None. IMPRESSION: No acute intracranial abnormality. Electronically Signed   By: Ronney Asters M.D.   On: 09/24/2020 21:07   MR BRAIN WO CONTRAST  Result Date: 09/28/2020 CLINICAL DATA:  Stroke, follow up EXAM: MRI HEAD WITHOUT CONTRAST TECHNIQUE: Multiplanar, multiecho pulse sequences of the brain and surrounding structures were obtained without intravenous contrast. COMPARISON:  Most recent MRI is from 2013 FINDINGS: Brain: There is no acute infarction or intracranial hemorrhage. There is no intracranial mass or significant mass effect. There is no hydrocephalus or extra-axial fluid collection. There is abnormal T2 hyperintensity involving the dorsal midbrain, pons, and medulla. Possible slight effacement of cerebral aqueduct focally. Additional patchy and confluent areas of T2 hyperintensity in the supratentorial white matter are nonspecific but probably reflect chronic microvascular ischemic changes. Prominence of the ventricles and sulci reflects parenchymal volume loss. Vascular: Major vessel flow voids at the skull base are preserved. Skull and upper cervical spine: Normal marrow signal is preserved. Sinuses/Orbits: Minor mucosal thickening. Bilateral lens replacements. Other: Sella is unremarkable.  Mastoid air cells are clear. IMPRESSION: Abnormal signal involving the dorsal brainstem with possible minor mass effect. The location accounts for reported intractable vomiting. Differential is wide and includes toxic/metabolic, infectious/post infectious, demyelinating/inflammatory, and neoplastic etiologies. Postcontrast imaging is recommended. Electronically Signed   By: Macy Mis M.D.   On: 09/28/2020 15:09   MR BRAIN W CONTRAST  Result Date: 09/29/2020 CLINICAL DATA:  Follow-up from  prior exam obtained 1 day ago EXAM: MRI HEAD WITH CONTRAST TECHNIQUE: Multiplanar, multiecho  pulse sequences of the brain and surrounding structures were obtained with intravenous contrast. CONTRAST:  38mL GADAVIST GADOBUTROL 1 MMOL/ML IV SOLN COMPARISON:  Noncontrast brain MRI obtained 1 day prior FINDINGS: Brain: There is focal masslike enhancement in the region of the cerebral aqueduct measuring 0.9 cm AP by 0.9 cm TV by 0.9 cm cc corresponding to T2/FLAIR signal abnormality seen on the prior study. There is separate nodular enhancement along the anterior aspect of the fourth ventricle more inferiorly. There is nodular appearing enhancement of the cranial nerve 7/8 nerve complexes in the bilateral IAC's. There is abnormal enhancement of the right fifth cranial nerve. There is a homogeneously enhancing lesion along the lateral aspect of the right tentorial leaflet measuring up to 0.8 cm by 0.5 cm corresponding to a calcified lesion present on prior head CTs, most likely reflecting a meningioma. Vascular: The major blood vessels enhance normally. Skull and upper cervical spine: Normal marrow signal. Sinuses/Orbits: Grossly unremarkable. Other: None. IMPRESSION: Masslike enhancement in the region of the cerebral aqueduct measuring up to 0.9 cm, and nodular enhancement along the ventral aspect of fourth ventricle and multiple cranial nerves as above. 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 Recommend correlation with lumbar puncture. Electronically Signed   By: Valetta Mole M.D.   On: 09/29/2020 11:53   DG CHEST PORT 1 VIEW  Result Date: 10/01/2020 CLINICAL DATA:  Shortness of breath EXAM: PORTABLE CHEST 1 VIEW COMPARISON:  August 07, 2019 FINDINGS: The cardiomediastinal silhouette is unchanged in contour.RIGHT chest port tip terminating over the superior cavoatrial junction. RIGHT-sided skinfold. No pleural effusion. No pneumothorax. Unchanged LEFT basilar atelectasis/scar. No acute pleuroparenchymal abnormality. Visualized abdomen is unremarkable. Multilevel degenerative  changes of the thoracic spine. IMPRESSION: LEFT basilar atelectasis Electronically Signed   By: Valentino Saxon M.D.   On: 10/01/2020 11:46    ASSESSMENT AND PLAN: 1.  Diffuse large cell non-Hodgkin's lymphoma 2.  Headache with intractable nausea/vomiting, diplopia, nystagmus 3.  Anemia secondary to iron deficiency and renal insufficiency 4.  Thrombocytopenia 5.  CKD 6.  Paroxysmal atrial relation with underlying left bundle branch block 7.  Hypothyroidism 8.  Hypertension 9.  Diabetes mellitus 10.  Peripheral neuropathy  -The patient has a history of lymphoma.  Given his new onset symptoms of headache with intractable nausea/vomiting, diplopia, nystagmus, there is concern for CNS relapse of his lymphoma.  He underwent lumbar puncture earlier today and will await the results of this.  I briefly discussed the possibility of CNS relapse with his daughter who was at the bedside.  She is aware that if this diagnosis is proven, it may be difficult to treat and he is not a candidate for high-dose methotrexate.  Radiation may be an option for him.  However, we will await final results from the lumbar puncture. -Continue supportive care for his headaches and nausea vomiting. -The patient's hemoglobin and platelets are overall stable.  No need for transfusion at this time. -Management of chronic medical conditions per hospitalist.  Future Appointments  Date Time Provider [place]  10/09/2020  2:00 PM [name], MD [facility]  11/13/2020  9:45 AM [facility]SE HEALTH ADVISOR [facility]      LOS: 5 days   Mikey Bussing, DNP, AGPCNP-BC, AOCNP 10/03/20  ADDENDUM:  The spinal fluid is very c/w malignancy as the glucose is quite low and the protein is quite high.    We  will await the cytology report.  I agree with the steroids -- I know that it will "bump" up his glucose, so this will need to be monitored.  I would consider XRT for this once path confirms NHL.  We should get  a CT chest to make sure there is no systemic recurrence.  I do think that he would tolerate Ibrutinib/Rituxan -- this does have a decent response rate with acceptable toxicity.  Our goal is clearly QOL.  I know he is getting great care from the staff on 5W!!!  Pete Idabell Picking  [place] 2:3

## 2020-10-03 NOTE — Progress Notes (Signed)
PT Cancellation Note  Patient Details Name: Gary Yates. MRN: [id number] DOB: May 26, 1933   Cancelled Treatment:    Reason Eval/Treat Not Completed: Active bedrest order - bedrest s/p spinal tap. Will check back pm.   Stacie Glaze, PT DPT Acute Rehabilitation Services Pager [phone]  Office [phone]    Louis Matte 10/03/2020, 10:45 AM

## 2020-10-03 NOTE — Progress Notes (Signed)
PT Cancellation Note  Patient Details Name: Gary Yates. MRN: [id number] DOB: 12-20-1933   Cancelled Treatment:    Reason Eval/Treat Not Completed: Fatigue/lethargy limiting ability to participate - Pt sleeping soundly, pt's daughter requesting PT hold until tomorrow.   Stacie Glaze, PT DPT Acute Rehabilitation Services Pager [phone]  Office [phone]    Louis Matte 10/03/2020, 2:51 PM

## 2020-10-03 NOTE — Procedures (Addendum)
LUMBAR PUNCTURE (SPINAL TAP) PROCEDURE NOTE  Indication: Brain lesion, Infection vs. Metastasis.  Proceduralists: Clance Boll, NP                          Mike Craze, MD, proctor   Risks of the procedure were dicussed with the patient including post-LP headache, bleeding, infection, weakness/numbness of legs(radiculopathy), death.    Consent obtained from: patient   Procedure Note The patient was prepped and draped, and using sterile technique a 20 gauge quinke spinal needle was inserted in the L4-5 space.   Opening pressure of 10.  Approximately 17cc of CSF were obtained and sent for analysis.  Patient tolerated the procedure well and blood loss was minimal.  Spoke to Dr. Marin Olp to get tests he would like run and added to orders. NP walked CSF specimens to lab.   Clance Boll, MSN, APN-BC Neurology Nurse Practitioner Pager [phone]

## 2020-10-03 NOTE — Progress Notes (Signed)
Nutrition Follow-up  DOCUMENTATION CODES:   Not applicable  INTERVENTION:   -D/c Ensure Enlive po TID, each supplement provides 350 kcal and 20 grams of protein  -Boost Breeze po TID, each supplement provides 250 kcal and 9 grams of protein  -30 ml Prosource Plus TID, each supplement provides 100 kcals and 15 grams protein -Continue Magic cup TID with meals, each supplement provides 290 kcal and 9 grams of protein  -Continue MVI with minerals daily -Advance diet to soft diet; plan of care discussed with MD  NUTRITION DIAGNOSIS:   Predicted suboptimal nutrient intake related to nausea, vomiting as evidenced by per patient/family report.  Ongoing  GOAL:   Patient will meet greater than or equal to 90% of their needs  Progressing   MONITOR:   PO intake, Supplement acceptance, Diet advancement, Weight trends, Skin, I & O's  REASON FOR ASSESSMENT:   Consult Assessment of nutrition requirement/status, Poor PO  ASSESSMENT:   87 year old male with history of relapsed non-Hodgkin's lymphoma of the testes in remission who presents with occipital headache, intractable nausea and vomiting, right sided nystagmus with findings of abnormal signal seen on dorsal brainstem on MRI.  9/23- MRI revealed abnormal signal seen along the dorsal brainstem with possible minor mass-effect, location accounts for reported intractable vomiting; Finding suspicious for leptomeningeal spread of malignancy 9/27- s/p lumbar puncture  Reviewed I/O's: -595 ml x 24 hours and +1.1 L since admission  UOP: 800 ml x 24 hours  Pt resting quietly in bed at time of visit. RD was able to awaken pt, but he often went back to sleep during interview. Pt is very hard of hearing, but responded to close ended questions.   Per pt, he has had a poor appetite for the past few weeks. Per his report, he does not feel like eating. He has not eaten anything today. Noted meal completion 50%. Pt reports he is consuming Ensure  supplements, but has refused the past several doses secondary to nausea.   Pt with some generalized mild fat and muscle depletion, but difficult to assess if is this is related to advanced age or malnutrition, but suspect this is multifactorial.   Noted pt has been on a full liquid diet for several days. Case discussed with MD; received permission to advance to soft diet.   Medications reviewed and include decadron, folic acid, reglan, ativan, thiamine, and vitamin B-12.   Labs reviewed: Na: 131, K: 3.4, CBGS: 125-141 (inpatient orders for glycemic control are 0-5 units insulin aspart daily at bedtime and 0-9 units insulin aspart TID with meals).    NUTRITION - FOCUSED PHYSICAL EXAM:  Flowsheet Row Most Recent Value  Orbital Region Mild depletion  Upper Arm Region Mild depletion  Thoracic and Lumbar Region No depletion  Buccal Region Mild depletion  Temple Region Mild depletion  Clavicle Bone Region Mild depletion  Clavicle and Acromion Bone Region No depletion  Scapular Bone Region Mild depletion  Dorsal Hand No depletion  Patellar Region No depletion  Anterior Thigh Region No depletion  Posterior Calf Region No depletion  Edema (RD Assessment) None  Hair Reviewed  Eyes Reviewed  Mouth Reviewed  Skin Reviewed  Nails Reviewed       Diet Order:   Diet Order             Diet full liquid Room service appropriate? Yes; Fluid consistency: Thin  Diet effective now  EDUCATION NEEDS:   No education needs have been identified at this time  Skin:  Skin Assessment: Reviewed RN Assessment  Last BM:  10/01/20  Height:   Ht Readings from Last 1 Encounters:  09/29/20 5\' 10"  (1.778 m)    Weight:   Wt Readings from Last 1 Encounters:  09/29/20 90 kg    Ideal Body Weight:  75.5 kg  BMI:  Body mass index is 28.47 kg/m.  Estimated Nutritional Needs:   Kcal:  1686-1042  Protein:  105-120 grams  Fluid:  > 2 L    Loistine Chance, RD, LDN,  [place] Registered Dietitian II Certified Diabetes Care and Education Specialist Please refer to [facility] for RD and/or RD on-call/weekend/after hours pager

## 2020-10-03 NOTE — Progress Notes (Signed)
PROGRESS NOTE        PATIENT DETAILS Name: Gary Yates. Age: 87 y.o. Sex: male Date of Birth: July 21, 1933 Admit Date: 09/28/2020 Admitting Physician Orene Desanctis, DO GUR:[id number], Alinda Sierras, MD  Brief Narrative: Patient is a 87 y.o. 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  See below for further details.    9/23>>Urine Culture: Pending 9/22: CT abdomen/pelvis: No acute abnormality-prostatomegaly-no enlarged lymph nodes. 9/22: MRI brain-no contrast: T2 hyperintensity involving dorsal midbrain/pons/medulla 9/23>> MRI brain with contrast: Masslike enhancement of the cerebral aqueduct-nodular enhancement along the ventricle and multiple cranial nerves-suspicious for leptomeningeal spread of malignancy 9/27>> LP due  Subjective: Patient in bed, appears comfortable, mild headache, no fever, no chest pain or pressure, no shortness of breath , no abdominal pain. No new focal weakness.   Objective:  Vitals:  Blood pressure 130/74, pulse 71, temperature 98.5 F (36.9 C), temperature source Oral, resp. rate 17, height 5\' 10"  (1.778 m), weight 90 kg, SpO2 94 %.   Exam:  Awake Alert, No new F.N deficits, Normal affect [place].AT,PERRAL Supple Neck,No JVD, No cervical lymphadenopathy appriciated.  Symmetrical Chest wall movement, Good air movement bilaterally, CTAB RRR,No Gallops, Rubs or new Murmurs, No Parasternal Heave +ve B.Sounds, Abd Soft, No tenderness, No organomegaly appriciated, No rebound - guarding or rigidity. No Cyanosis, Clubbing or edema, No new Rash or bruise     Assessment/Plan:  Headache with intractable nausea/vomiting/diplopia/nystagmus: Likely due to leptomeningeal involvement  of CNS from underlying malignancy.  Headache better-nausea vomiting currently relatively well controlled with antiemetics.  Likely LP due  10/03/20. Oncologist-Dr. Marin Olp following as well, per oncology not a candidate for aggressive treatment in the future.  Patient also very reasonable.  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  AKI on CKD stage IIIa: AKI likely hemodynamically mediated in the setting of nausea/vomiting-improved with supportive care.  PAF with underlying LBBB: Rate controlled-  Hep gtt likely 10/04/20 1 day post LP >> Coumadin  Hypothyroidism: Continue Synthroid  Hypokalemia - replaced  HTN: BP controlled-monitor off antihypertensives.  DM-2 (A1c 6.3 on 7/27): CBG stable-on SSI.  Recent Labs    10/02/20 1632 10/02/20 2204 10/03/20 0734  GLUCAP 112* 128* 125*    Peripheral neuropathy: On Neurontin.  Nutrition Status: Nutrition Problem: Predicted suboptimal nutrient intake Etiology: nausea, vomiting Signs/Symptoms: per patient/family report Interventions: Ensure Enlive (each supplement provides 350kcal and 20 grams of protein), Magic cup, MVI  Estimated body mass index is 28.47 kg/m as calculated from the following:   Height as of this encounter: 5\' 10"  (1.778 m).   Weight as of this encounter: 90 kg.    Procedures: None Consults: Neurology, Onco DVT Prophylaxis: Therapeutic INR - Coumadin on hold. Code Status:Full code  Family Communication: son bedside 10/01/20, daughter bedside on 10/02/2020  Time spent: 34 minutes-Greater than 50% of this time was spent in counseling, explanation of diagnosis, planning of further management, and coordination of care.  Diet: Diet Order             Diet full liquid Room service appropriate? Yes; Fluid consistency: Thin  Diet effective now  Disposition Plan: Status is: Inpatient  Remains inpatient appropriate because:Inpatient  level of care appropriate due to severity of illness  Dispo: The patient is from: Home              Anticipated d/c is to: Home              Patient currently is not medically stable to d/c.   Difficult to place patient No     Barriers to Discharge: Intractable headache/nausea/vomiting due to leptomeningeal involvement-lumbar puncture scheduled for this coming Monday.  Needs scheduled antiemetics to control symptoms.  Not yet stable for discharge.  Antimicrobial agents: Anti-infectives (From admission, onward)    None        MEDICATIONS: Scheduled Meds:  atorvastatin  40 mg Oral Daily   Chlorhexidine Gluconate Cloth  6 each Topical Daily   escitalopram  10 mg Oral Daily   feeding supplement  237 mL Oral TID BM   folic acid  2 mg Oral Daily   gabapentin  600 mg Oral BID   insulin aspart  0-5 Units Subcutaneous QHS   insulin aspart  0-9 Units Subcutaneous TID WC   levothyroxine  25 mcg Oral Q0600   LORazepam  0.5 mg Intravenous Once   metoCLOPramide (REGLAN) injection  5 mg Intravenous Q6H   multivitamin with minerals  1 tablet Oral Daily   pantoprazole  40 mg Oral Daily   pramipexole  3 mg Oral QHS   thiamine injection  100 mg Intravenous Q24H   vitamin B-12  1,000 mcg Oral Daily   Continuous Infusions:  magnesium sulfate bolus IVPB 1 g (10/03/20 0941)   PRN Meds:.acetaminophen, fentaNYL (SUBLIMAZE) injection, labetalol, ondansetron (ZOFRAN) IV, polyethylene glycol, prochlorperazine   I have personally reviewed following labs and imaging studies  LABORATORY DATA:  Recent Labs  Lab 09/28/20 0935 09/29/20 0435 10/01/20 0058 10/02/20 0148 10/03/20 0255  WBC 5.2 6.3 7.1 6.1 5.5  HGB 10.7* 11.3* 11.1* 10.7* 10.5*  HCT 33.2* 34.5* 33.2* 32.1* 31.8*  PLT 119* 121* 125* 130* 125*  MCV 98.2 96.9 95.1 95.5 96.1  MCH 31.7 31.7 31.8 31.8 31.7  MCHC 32.2 32.8 33.4 33.3 33.0  RDW 16.5* 16.2* 15.6* 15.6* 15.1  LYMPHSABS 1.1  --   --  1.3 1.1  MONOABS 0.4  --   --  0.6  0.5  EOSABS 0.1  --   --  0.2 0.2  BASOSABS 0.1  --   --  0.1 0.1    Recent Labs  Lab 09/28/20 0935 09/28/20 0936 09/28/20 1532 09/29/20 0435 09/29/20 0849 09/30/20 0242 09/30/20 0830 10/01/20 0058 10/02/20 0148 10/03/20 0255  NA 135  --   --  136  --  135  --  136 133* 131*  K 4.3  --   --  4.1  --  4.3  --  4.0 3.7 3.4*  CL 102  --   --  102  --  102  --  102 97* 96*  CO2 22  --   --  26  --  26  --  25 27 28   GLUCOSE 125*  --   --  126*  --  115*  --  104* 120* 125*  BUN 37*  --   --  28*  --  20  --  18 21 21   CREATININE 2.62*  --   --  2.03*  --  1.85*  --  1.73* 1.85* 1.81*  CALCIUM 9.3  --   --  9.0  --  9.1  --  8.8* 8.8* 8.8*  AST 14*  --   --   --   --   --   --   --  14* 13*  ALT 7  --   --   --   --   --   --   --  6 6  ALKPHOS 86  --   --   --   --   --   --   --  70 76  BILITOT 0.7  --   --   --   --   --   --   --  0.8 1.1  ALBUMIN 3.7  --   --   --   --   --   --   --  3.0* 3.0*  MG  --   --   --   --   --   --   --   --  1.3* 1.8  INR  --   --    < >  --  3.0*  --  2.6* 2.2* 1.6* 1.3*  TSH  --  1.771  --   --   --   --   --   --   --   --   BNP  --   --   --   --   --   --   --  470.8* 420.3* 180.8*   < > = values in this interval not displayed.         RADIOLOGY STUDIES/RESULTS: CT HEAD WO CONTRAST (5MM)  Result Date: 10/02/2020 CLINICAL DATA:  Nystagmus, possible lymphoma of brain EXAM: CT HEAD WITHOUT CONTRAST TECHNIQUE: Contiguous axial images were obtained from the base of the skull through the vertex without intravenous contrast. COMPARISON:  09/24/2020, 09/29/2020 FINDINGS: Brain: No acute infarct or hemorrhage. Lateral ventricles and midline structures are stable. 8 mm hyperdense area in the region of the cerebral aqueduct just inferior to the calcified pineal gland corresponds to the abnormal area of enhancement on recent MRI. No acute extra-axial fluid collections. No mass effect. Vascular: Stable atherosclerosis.  No hyperdense vessel. Skull:  Normal. Negative for fracture or focal lesion. Sinuses/Orbits: No acute finding. Other: None. IMPRESSION: 1. No acute infarct or hemorrhage. 2. Subtle hyperdense area in the region of the cerebral aqueduct corresponds to the enhancing lesion seen on recent MRI, suspicious for malignancy. Electronically Signed   By: Randa Ngo M.D.   On: 10/02/2020 22:36   DG CHEST PORT 1 VIEW  Result Date: 10/01/2020 CLINICAL DATA:  Shortness of breath EXAM: PORTABLE CHEST 1 VIEW COMPARISON:  August 07, 2019 FINDINGS: The cardiomediastinal silhouette is unchanged in contour.RIGHT chest port tip terminating over the superior cavoatrial junction. RIGHT-sided skinfold. No pleural effusion. No pneumothorax. Unchanged LEFT basilar atelectasis/scar. No acute pleuroparenchymal abnormality. Visualized abdomen is unremarkable. Multilevel degenerative changes of the thoracic spine. IMPRESSION: LEFT basilar atelectasis Electronically Signed   By: Valentino Saxon M.D.   On: 10/01/2020 11:46     LOS: 5 days   Signature  Lala Lund M.D on 10/03/2020 at 10:34 AM   -  To page go to [email or web address]

## 2020-10-03 NOTE — Progress Notes (Signed)
Neurology Progress Note  S:  Had a HA which was relieved by Tylenol. States he has ringing in his ears. No vomiting, but still nauseated on occasion. Medications are helping. Daughter states she started to notice the decreased hearing 2 weeks prior to admission.   O: Current vital signs: BP 130/74 (BP Location: Right Arm)   Pulse 71   Temp 98.5 F (36.9 C) (Oral)   Resp 17   Ht 5\' 10"  (1.778 m)   Wt 90 kg   SpO2 94%   BMI 28.47 kg/m  Vital signs in last 24 hours: Temp:  [97.5 F (36.4 C)-98.5 F (36.9 C)] 98.5 F (36.9 C) (09/27 0736) Pulse Rate:  [67-78] 71 (09/27 0736) Resp:  [16-17] 17 (09/27 0736) BP: (108-133)/(58-88) 130/74 (09/27 0736) SpO2:  [94 %-95 %] 94 % (09/27 0736)  GENERAL: Chronically ill, but fairly well appearing elderly male.  Awake, alert in NAD. HEENT: Normocephalic and atraumatic. Very HOH.  LUNGS: Normal respiratory effort.  Ext: warm.  NEURO:  Mental Status: Alert and oriented x3. Follows commands.  Speech/Language: speech is without aphasia or dysarthria.  Naming, repetition, fluency, and comprehension intact.  Cranial Nerves:  PERRL. Bilateral left gazing nystagmus noted with 4 beats of right sided saccades. This is visible when patient is tracking NP as well.  EOMI. Eyelids elevate symmetrically. Sensation is intact to light touch and symmetrical to face. Smile is symmetrical. Very HOH.  Phonation is normal.  Motor: Moves all 4 extremities spontaneously and purposefully. Sensation- Intact to light touch bilaterally. Extinction absent to DSS.     Medications  Current Facility-Administered Medications:    acetaminophen (TYLENOL) tablet 650 mg, 650 mg, Oral, Q6H PRN, Tu, Ching T, DO, 650 mg at 10/03/20 0209   atorvastatin (LIPITOR) tablet 40 mg, 40 mg, Oral, Daily, Tu, Ching T, DO, 40 mg at 10/02/20 1055   Chlorhexidine Gluconate Cloth 2 % PADS 6 each, 6 each, Topical, Daily, Ghimire, Henreitta Leber, MD, 6 each at 10/02/20 1055   escitalopram (LEXAPRO)  tablet 10 mg, 10 mg, Oral, Daily, Tu, Ching T, DO, 10 mg at 10/02/20 1055   feeding supplement (ENSURE ENLIVE / ENSURE PLUS) liquid 237 mL, 237 mL, Oral, TID BM, Tu, Ching T, DO, 237 mL at 10/02/20 2201   fentaNYL (SUBLIMAZE) injection 12.5 mcg, 12.5 mcg, Intravenous, Q6H PRN, Tu, Ching T, DO, 12.5 mcg at 14/78/29 5621   folic acid (FOLVITE) tablet 2 mg, 2 mg, Oral, Daily, Ennever, Rudell Cobb, MD, 2 mg at 10/02/20 1054   gabapentin (NEURONTIN) capsule 600 mg, 600 mg, Oral, BID, Tu, Ching T, DO, 600 mg at 10/02/20 2201   insulin aspart (novoLOG) injection 0-5 Units, 0-5 Units, Subcutaneous, QHS, Rai, Ripudeep K, MD   insulin aspart (novoLOG) injection 0-9 Units, 0-9 Units, Subcutaneous, TID WC, Rai, Ripudeep K, MD, 2 Units at 10/02/20 1301   labetalol (NORMODYNE) injection 5 mg, 5 mg, Intravenous, Q4H PRN, Tu, Ching T, DO   levothyroxine (SYNTHROID) tablet 25 mcg, 25 mcg, Oral, Q0600, Tu, Ching T, DO, 25 mcg at 10/03/20 0526   LORazepam (ATIVAN) injection 0.5 mg, 0.5 mg, Intravenous, Once, Schlossman, Erin, MD   magnesium sulfate IVPB 1 g 100 mL, 1 g, Intravenous, Once, Thurnell Lose, MD   metoCLOPramide (REGLAN) injection 5 mg, 5 mg, Intravenous, Q6H, Rai, Ripudeep K, MD, 5 mg at 10/03/20 0526   multivitamin with minerals tablet 1 tablet, 1 tablet, Oral, Daily, Tu, Ching T, DO, 1 tablet at 10/02/20 1055   ondansetron (  ZOFRAN) injection 4 mg, 4 mg, Intravenous, Q6H PRN, Timothy Lasso, MD, 4 mg at 10/02/20 2111   pantoprazole (PROTONIX) EC tablet 40 mg, 40 mg, Oral, Daily, Tu, Ching T, DO, 40 mg at 10/02/20 1055   polyethylene glycol (MIRALAX / GLYCOLAX) packet 17 g, 17 g, Oral, Daily PRN, Tu, Ching T, DO   potassium chloride SA (KLOR-CON) CR tablet 40 mEq, 40 mEq, Oral, Once, Candiss Norse, Margaree Mackintosh, MD   pramipexole (MIRAPEX) tablet 3 mg, 3 mg, Oral, QHS, Tu, Ching T, DO, 3 mg at 10/02/20 2200   prochlorperazine (COMPAZINE) injection 5 mg, 5 mg, Intravenous, Q6H PRN, Timothy Lasso, MD, 5 mg at  10/03/20 0218   [COMPLETED] thiamine 500mg  in normal saline (30ml) IVPB, 500 mg, Intravenous, Q8H, Last Rate: 100 mL/hr at 10/01/20 2054, 500 mg at 10/01/20 2054 **FOLLOWED BY** thiamine (B-1) injection 100 mg, 100 mg, Intravenous, Q24H, Bhagat, Srishti L, MD, 100 mg at 10/03/20 0526   vitamin B-12 (CYANOCOBALAMIN) tablet 1,000 mcg, 1,000 mcg, Oral, Daily, Tu, Ching T, DO, 1,000 mcg at 10/02/20 1054  Pertinent Labs INR 1.3. MMA 226 WNL. Copper 104 WNL. Zinc 80 WNL.  NMO IgG < 1.5 WNL. B6 pending.   New imaging:   CT Head 10/02/20 -No acute infarct or hemorrhage. -Subtle hyperdense area in the region of the cerebral aqueduct corresponds to the enhancing lesion seen on recent MRI, suspicious for malignancy.  Assessment:  87 year old male with history of diffuse non-Hodgkin's lymphoma of the testes 2007 s/p radiation and chemotherapy. Relapse with chemo 3 years ago. MRI with findings of mass vs infection. INR is 1.3 today, so we will attempt bedside LP. CTH performed yesterday showed no changes from the MRI.    Impression: -Relapse of large cell non hodgkin's lymphoma with CNS symptoms and lesion on brain, infection vs. Metastasis, but favor mets  -AF, Coumadin presently on hold due to hypercoagulopathy.   Recommendations/Plan:  -Plan for LP today-done.  -Restart of Coumadin per medicine team and pharmacy.  -f/up B6. Rest of labs are normal as above.  -Start Decadron 20mg  IV daily per Dr. Marin Olp (ordered).  -f/up CSF: protein and glucose, gram stain and culture, cell count with differential, cytology, flow cytometry.   Pt seen by Clance Boll, MSN, APN-BC/Nurse Practitioner/Neuro and by MD. Plan discussed and approved by MD.  Pager: [phone]

## 2020-10-04 ENCOUNTER — Ambulatory Visit
Admit: 2020-10-04 | Discharge: 2020-10-04 | Disposition: A | Discharge status: Home / Self Care | Payer: Medicare Other | Source: Ambulatory Visit | Attending: Radiation Oncology | Admitting: Radiation Oncology

## 2020-10-04 ENCOUNTER — Inpatient Hospital Stay (HOSPITAL_COMMUNITY): Payer: Medicare Other

## 2020-10-04 DIAGNOSIS — R0602 Shortness of breath: Secondary | ICD-10-CM

## 2020-10-04 DIAGNOSIS — C8589 Other specified types of non-Hodgkin lymphoma, extranodal and solid organ sites: Secondary | ICD-10-CM

## 2020-10-04 DIAGNOSIS — Z7189 Other specified counseling: Secondary | ICD-10-CM | POA: Diagnosis not present

## 2020-10-04 DIAGNOSIS — Z515 Encounter for palliative care: Secondary | ICD-10-CM | POA: Diagnosis not present

## 2020-10-04 DIAGNOSIS — N1832 Chronic kidney disease, stage 3b: Secondary | ICD-10-CM | POA: Diagnosis not present

## 2020-10-04 DIAGNOSIS — R112 Nausea with vomiting, unspecified: Secondary | ICD-10-CM | POA: Diagnosis not present

## 2020-10-04 LAB — URINALYSIS, ROUTINE W REFLEX MICROSCOPIC
Bilirubin Urine: NEGATIVE
Glucose, UA: NEGATIVE mg/dL
Ketones, ur: NEGATIVE mg/dL
Nitrite: POSITIVE — AB
Protein, ur: 100 mg/dL — AB
RBC / HPF: >50 RBC/hpf — ABNORMAL HIGH (ref 0–5)
Specific Gravity, Urine: 1.018 (ref 1.005–1.030)
WBC, UA: >50 WBC/hpf — ABNORMAL HIGH (ref 0–5)
pH: 5 (ref 5.0–8.0)

## 2020-10-04 LAB — CBC WITH DIFFERENTIAL/PLATELET
Abs Immature Granulocytes: 0.02 10*3/uL (ref 0.00–0.07)
Basophils Absolute: 0 10*3/uL (ref 0.0–0.1)
Basophils Relative: 0 %
Eosinophils Absolute: 0 10*3/uL (ref 0.0–0.5)
Eosinophils Relative: 0 %
HCT: 31.9 % — ABNORMAL LOW (ref 39.0–52.0)
Hemoglobin: 10.4 g/dL — ABNORMAL LOW (ref 13.0–17.0)
Immature Granulocytes: 0 %
Lymphocytes Relative: 14 %
Lymphs Abs: 0.7 10*3/uL (ref 0.7–4.0)
MCH: 31.4 pg (ref 26.0–34.0)
MCHC: 32.6 g/dL (ref 30.0–36.0)
MCV: 96.4 fL (ref 80.0–100.0)
Monocytes Absolute: 0.1 10*3/uL (ref 0.1–1.0)
Monocytes Relative: 2 %
Neutro Abs: 4 10*3/uL (ref 1.7–7.7)
Neutrophils Relative %: 84 %
Platelets: 143 10*3/uL — ABNORMAL LOW (ref 150–400)
RBC: 3.31 MIL/uL — ABNORMAL LOW (ref 4.22–5.81)
RDW: 14.7 % (ref 11.5–15.5)
WBC: 4.8 10*3/uL (ref 4.0–10.5)
nRBC: 0 % (ref 0.0–0.2)

## 2020-10-04 LAB — COMPREHENSIVE METABOLIC PANEL
ALT: 7 U/L (ref 0–44)
AST: 14 U/L — ABNORMAL LOW (ref 15–41)
Albumin: 2.9 g/dL — ABNORMAL LOW (ref 3.5–5.0)
Alkaline Phosphatase: 75 U/L (ref 38–126)
Anion gap: 7 (ref 5–15)
BUN: 28 mg/dL — ABNORMAL HIGH (ref 8–23)
CO2: 25 mmol/L (ref 22–32)
Calcium: 9 mg/dL (ref 8.9–10.3)
Chloride: 97 mmol/L — ABNORMAL LOW (ref 98–111)
Creatinine, Ser: 1.8 mg/dL — ABNORMAL HIGH (ref 0.61–1.24)
GFR, Estimated: 36 mL/min — ABNORMAL LOW (ref >60)
Glucose, Bld: 217 mg/dL — ABNORMAL HIGH (ref 70–99)
Potassium: 4.4 mmol/L (ref 3.5–5.1)
Sodium: 129 mmol/L — ABNORMAL LOW (ref 135–145)
Total Bilirubin: 0.9 mg/dL (ref 0.3–1.2)
Total Protein: 6 g/dL — ABNORMAL LOW (ref 6.5–8.1)

## 2020-10-04 LAB — OSMOLALITY, URINE: Osmolality, Ur: 548 mosm/kg (ref 300–900)

## 2020-10-04 LAB — GLUCOSE, CAPILLARY
Glucose-Capillary: 134 mg/dL — ABNORMAL HIGH (ref 70–99)
Glucose-Capillary: 161 mg/dL — ABNORMAL HIGH (ref 70–99)
Glucose-Capillary: 308 mg/dL — ABNORMAL HIGH (ref 70–99)
Glucose-Capillary: 167 mg/dL — ABNORMAL HIGH (ref 70–99)

## 2020-10-04 LAB — MAGNESIUM: Magnesium: 2 mg/dL (ref 1.7–2.4)

## 2020-10-04 LAB — PROTIME-INR
INR: 1.2 (ref 0.8–1.2)
Prothrombin Time: 15 seconds (ref 11.4–15.2)

## 2020-10-04 LAB — CYTOLOGY - NON PAP

## 2020-10-04 LAB — OSMOLALITY: Osmolality: 291 mOsm/kg (ref 275–295)

## 2020-10-04 LAB — CRYPTOCOCCAL ANTIGEN, CSF: Crypto Ag: NEGATIVE

## 2020-10-04 LAB — BRAIN NATRIURETIC PEPTIDE: B Natriuretic Peptide: 318.8 pg/mL — ABNORMAL HIGH (ref 0.0–100.0)

## 2020-10-04 LAB — SODIUM, URINE, RANDOM: Sodium, Ur: 19 mmol/L

## 2020-10-04 LAB — URIC ACID: Uric Acid, Serum: 6.4 mg/dL (ref 3.7–8.6)

## 2020-10-04 MED ORDER — SODIUM CHLORIDE 0.9 % IV SOLN
12.5000 mg | Freq: Three times a day (TID) | INTRAVENOUS | Status: DC | PRN
  Filled 2020-10-04: qty 0.5

## 2020-10-04 MED ORDER — THIAMINE HCL 100 MG PO TABS
100.0000 mg | ORAL_TABLET | Freq: Every day | ORAL | Status: DC
  Administered 2020-10-05 – 2020-10-10 (×6): 100 mg via ORAL
  Filled 2020-10-04 (×6): qty 1

## 2020-10-04 MED ORDER — LACTATED RINGERS IV SOLN: INTRAVENOUS | Status: AC

## 2020-10-04 MED ORDER — ONDANSETRON HCL 4 MG/2ML IJ SOLN
4.0000 mg | Freq: Three times a day (TID) | INTRAMUSCULAR | Status: DC
  Administered 2020-10-04 – 2020-10-10 (×17): 4 mg via INTRAVENOUS
  Filled 2020-10-04 (×18): qty 2

## 2020-10-04 MED ORDER — DIAZEPAM 2 MG PO TABS
2.0000 mg | ORAL_TABLET | Freq: Two times a day (BID) | ORAL | Status: DC
  Administered 2020-10-04 – 2020-10-10 (×12): 2 mg via ORAL
  Filled 2020-10-04 (×13): qty 1

## 2020-10-04 MED ORDER — WARFARIN - PHARMACIST DOSING INPATIENT: Freq: Every day | Status: DC

## 2020-10-04 MED ORDER — WARFARIN SODIUM 5 MG PO TABS
5.0000 mg | ORAL_TABLET | ORAL | Status: AC
  Administered 2020-10-04: 5 mg via ORAL
  Filled 2020-10-04: qty 1

## 2020-10-04 MED ORDER — SODIUM CHLORIDE 0.9 % IV SOLN
1.0000 g | INTRAVENOUS | Status: AC
  Administered 2020-10-04 – 2020-10-08 (×5): 1 g via INTRAVENOUS
  Filled 2020-10-04 (×5): qty 10

## 2020-10-04 NOTE — Progress Notes (Signed)
Physical Therapy Treatment Patient Details Name: Gary Yates. MRN: [id number] DOB: 07/03/1933 Today's Date: 10/04/2020   History of Present Illness [name] who presents on 09/28/20 with several week hx of dizziness, headache, N/V. 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 MRI brain with contrast: Masslike enhancement of the cerebral aqueduct-nodular enhancement along the ventricle and multiple cranial nerves-suspicious for leptomeningeal spread of malignancy. s/p LP with cytology/flow cytometry on 9/27. PMH includes A-fib, COPD, CAD, non-Hodgkin's lymphoma in remission, HTN, OSA, DM.    PT Comments    Pt progressing towards goals, however, remains limited secondary to dizziness. Required mod A for bed mobility and transfers using RW. Pt reporting dizziness worsened with mobility tasks. Spoke with pt's daughter about possible SNF, however, she would like to await test results prior to making decision. Discussed need for increased support should pt return home. Will continue to follow acutely.     Recommendations for follow up therapy are one component of a multi-disciplinary discharge planning process, led by the attending physician.  Recommendations may be updated based on patient status, additional functional criteria and insurance authorization.  Follow Up Recommendations  Home health PT;Supervision/Assistance - 24 hour (vs SNF pending progression and test results)     Equipment Recommendations  None recommended by PT    Recommendations for Other Services       Precautions / Restrictions Precautions Precautions: Fall Restrictions Weight Bearing Restrictions: No     Mobility  Bed Mobility Overal bed mobility: Needs Assistance Bed Mobility: Supine to Sit     Supine to sit: Mod assist     General bed mobility comments: Mod A for assist with  trunk and LEs. Also required assist to scoot hips to EOB.    Transfers Overall transfer level: Needs assistance Equipment used: Rolling walker (2 wheeled) Transfers: Sit to/from [facility] Sit to Stand: Mod assist;From elevated surface Stand pivot transfers: Mod assist       General transfer comment: Required assist for lift assist and steadying. Cues for hand placement. Pt reporting increased dizziness with transfers and did not want to attempt further mobility.  Ambulation/Gait                 Stairs             Wheelchair Mobility    Modified Rankin (Stroke Patients Only)       Balance Overall balance assessment: History of Falls;Needs assistance Sitting-balance support: Feet supported;Single extremity supported Sitting balance-Leahy Scale: Fair     Standing balance support: Bilateral upper extremity supported;During functional activity Standing balance-Leahy Scale: Poor Standing balance comment: Reliant on UE and external support                            Cognition Arousal/Alertness: Awake/alert Behavior During Therapy: WFL for tasks assessed/performed Overall Cognitive Status: Within Functional Limits for tasks assessed                                 General Comments: appears [facility] for basic mobility tasks. Follows commands with repetition likely due to [facility]. Impaired memory noted and recall.      Exercises      General Comments General comments (skin integrity, edema, etc.): Central nystagmus noted. Discussed SNF, however, daughter wanting  to see results first prior to deciding home vs. SNF      Pertinent Vitals/Pain Pain Assessment: No/denies pain    Home Living                      Prior Function            PT Goals (current goals can now be found in the care plan section) Acute Rehab PT Goals Patient Stated Goal: to go home PT Goal Formulation: With patient/family Time For Goal  Achievement: 10/15/20 Potential to Achieve Goals: Good Progress towards PT goals: Progressing toward goals    Frequency    Min 3X/week      PT Plan Current plan remains appropriate    Co-evaluation              AM-PAC PT "6 Clicks" Mobility   Outcome Measure  Help needed turning from your back to your side while in a flat bed without using bedrails?: A Little Help needed moving from lying on your back to sitting on the side of a flat bed without using bedrails?: A Lot Help needed moving to and from a bed to a chair (including a wheelchair)?: A Lot Help needed standing up from a chair using your arms (e.g., wheelchair or bedside chair)?: A Lot Help needed to walk in hospital room?: A Lot Help needed climbing 3-5 steps with a railing? : A Lot 6 Click Score: 13    End of Session Equipment Utilized During Treatment: Gait belt Activity Tolerance: Treatment limited secondary to medical complications (Comment) (dizziness) Patient left: in chair;with call bell/phone within reach;with nursing/sitter in room;with family/visitor present Nurse Communication: Mobility status PT Visit Diagnosis: Unsteadiness on feet (R26.81);Dizziness and giddiness (R42);Difficulty in walking, not elsewhere classified (R26.2);History of falling (Z91.81)     Time: 3267-1245 PT Time Calculation (min) (ACUTE ONLY): 20 min  Charges:  $Therapeutic Activity: 8-22 mins                     Gary Yates, DPT  Acute Rehabilitation Services  Pager: [phone] Office: [phone]    Gary Yates 10/04/2020, 3:41 PM

## 2020-10-04 NOTE — Progress Notes (Signed)
This chaplain responded to PMT consult for spiritual care.  The Pt. is sitting up in the bedside recliner at the time of the visit. The Pt. daughter-Lori is at the bedside. The Pt. shares he ate lunch and the nausea is a little better.  The Pt. hears better with his right ear and speaks freely about his personal relationship with God, "only God will know the time."  The chaplain understands the Pt. father was a Conservator, museum/gallery and the Pt. and family continue to practice the [employer].  Through reflective listening the chaplain understands the Pt. is not afraid of death, because he will be able to join his wife of almost 51 years.  The Pt. has many stories of how he is faithful and God will respond in faith.  The Pt. accepted the chaplain's invitation for prayer and F/U spiritual care.  Chaplain Sallyanne Kuster Pager [phone]

## 2020-10-04 NOTE — Progress Notes (Signed)
ANTICOAGULATION CONSULT NOTE - Follow Up Consult  Pharmacy Consult for Warfarin Indication: atrial fibrillation  Allergies  Allergen Reactions   Latex Other (See Comments)    Reddens the skin   Ace Inhibitors Cough   Codeine Nausea Only and Rash        Compazine [Prochlorperazine] Anxiety   Penicillins Rash    Childhood allergy Has patient had a PCN reaction causing immediate rash, facial/tongue/throat swelling, SOB or lightheadedness with hypotension: Yes Has patient had a PCN reaction causing severe rash involving mucus membranes or skin necrosis: Yes Has patient had a PCN reaction that required hospitalization No Has patient had a PCN reaction occurring within the last 10 years: No If all of the above answers are "NO", then may proceed with Cephalosporin use.     Labs: Recent Labs    10/02/20 0148 10/03/20 0255 10/04/20 0049  HGB 10.7* 10.5* 10.4*  HCT 32.1* 31.8* 31.9*  PLT 130* 125* 143*  LABPROT 18.7* 15.7* 15.0  INR 1.6* 1.3* 1.2  CREATININE 1.85* 1.81* 1.80*    Estimated Creatinine Clearance: 33.9 mL/min (A) (by C-G formula based on SCr of 1.8 mg/dL (H)).  Assessment: Resuming prior to admission warfarin for PAF, INR 1.2 today. S/p LP, awaiting cytology  Dose prior to admission 5 mg Monday and Thursday, 2.5 mg all other days  Goal of Therapy:  INR 2-3 Monitor platelets by anticoagulation protocol: Yes   Plan:  Warfarin 5 mg po x 1 now Daily INR  Thank you Anette Guarneri, PharmD 10/04/2020,11:35 AM

## 2020-10-04 NOTE — Progress Notes (Signed)
PROGRESS NOTE        PATIENT DETAILS Name: Gary Yates. Age: 87 y.o. Sex: male Date of Birth: 14-Mar-1933 Admit Date: 09/28/2020 Admitting Physician Orene Desanctis, DO [id number], Alinda Sierras, MD  Brief Narrative: Patient is a 87 y.o. 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  See below for further details.    9/23>>Urine Culture: Pending 9/22: CT abdomen/pelvis: No acute abnormality-prostatomegaly-no enlarged lymph nodes. 9/22: MRI brain-no contrast: T2 hyperintensity involving dorsal midbrain/pons/medulla 9/23>> MRI brain with contrast: Masslike enhancement of the cerebral aqueduct-nodular enhancement along the ventricle and multiple cranial nerves-suspicious for leptomeningeal spread of malignancy 9/27>> LP due  Subjective:  Patient in bed, appears comfortable, denies any headache, no fever, no chest pain or pressure, no shortness of breath , no abdominal pain, positive nausea. No new focal weakness.    Objective:  Vitals:  Blood pressure 132/67, pulse 68, temperature 98 F (36.7 C), temperature source Axillary, resp. rate 17, height 5\' 10"  (1.778 m), weight 90 kg, SpO2 96 %.   Exam:  Awake Alert, No new F.N deficits, Normal affect [place].AT,PERRAL Supple Neck,No JVD, No cervical lymphadenopathy appriciated.  Symmetrical Chest wall movement, Good air movement bilaterally, CTAB RRR,No Gallops, Rubs or new Murmurs, No Parasternal Heave +ve B.Sounds, Abd Soft, No tenderness, No organomegaly appriciated, No rebound - guarding or rigidity. 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  Headache better-nausea vomiting still an issue will place him on scheduled Valium along with scheduled Zofran and as needed Phenergan and monitor.  Likely LP done  10/03/20, cytology pending. Oncologist-Dr. Marin Olp following as well, will let oncology decide on course of action after cytology is back.  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  AKI on CKD stage IIIa: AKI likely hemodynamically mediated in the setting of nausea/vomiting-improved with supportive care.  PAF with underlying LBBB: Rate controlled-  resume Coumadin.  Hypothyroidism: Continue Synthroid  Hypokalemia - replaced  HTN: BP controlled-monitor off antihypertensives.  Hyponatremia.  Appears dehydrated.  IV fluids on 10/04/2020.    UTI.  Empiric Rocephin x5 days.    DM-2 (A1c 6.3 on 7/27): CBG stable-on SSI.  Recent Labs    10/03/20 1546 10/03/20 2120 10/04/20 0745  GLUCAP 162* 255* 134*    Peripheral neuropathy: On Neurontin.  Nutrition Status: Nutrition Problem: Predicted suboptimal nutrient intake Etiology: nausea, vomiting Signs/Symptoms: per patient/family report Interventions: Ensure Enlive (each supplement provides 350kcal and 20 grams of protein), Magic cup, MVI  Estimated body mass index is 28.47 kg/m as calculated from the following:   Height as of this encounter: 5\' 10"  (1.778 m).   Weight as of this encounter: 90 kg.    Procedures: None Consults: Neurology, Onco DVT Prophylaxis: Therapeutic INR - Coumadin on hold. Code Status:Full code  Family Communication: son bedside 10/01/20, daughter bedside on 10/02/2020, daughter [name] [phone] 10/04/20  Time spent: 96 minutes-Greater than 50% of this time was spent in counseling, explanation of diagnosis, planning of further management, and coordination of care.  Diet: Diet Order  DIET SOFT Room service  appropriate? Yes; Fluid consistency: Thin  Diet effective now                    Disposition Plan: Status is: Inpatient  Remains inpatient appropriate because:Inpatient level of care appropriate due to severity of illness  Dispo: The patient is from: Home              Anticipated d/c is to: Home              Patient currently is not medically stable to d/c.   Difficult to place patient No     Barriers to Discharge: Intractable headache/nausea/vomiting due to leptomeningeal involvement-lumbar puncture scheduled for this coming Monday.  Needs scheduled antiemetics to control symptoms.  Not yet stable for discharge.  Antimicrobial agents: Anti-infectives (From admission, onward)    None        MEDICATIONS: Scheduled Meds:  (feeding supplement) PROSource Plus  30 mL Oral TID BM   atorvastatin  40 mg Oral Daily   Chlorhexidine Gluconate Cloth  6 each Topical Daily   dexamethasone (DECADRON) injection  20 mg Intravenous Daily   escitalopram  10 mg Oral Daily   feeding supplement  1 Container Oral TID BM   folic acid  2 mg Oral Daily   gabapentin  600 mg Oral BID   insulin aspart  0-5 Units Subcutaneous QHS   insulin aspart  0-9 Units Subcutaneous TID WC   levothyroxine  25 mcg Oral Q0600   LORazepam  0.5 mg Intravenous Once   metoCLOPramide (REGLAN) injection  5 mg Intravenous Q6H   multivitamin with minerals  1 tablet Oral Daily   pantoprazole  40 mg Oral Daily   pramipexole  3 mg Oral QHS   [START ON 10/05/2020] thiamine  100 mg Oral Daily   vitamin B-12  1,000 mcg Oral Daily   Continuous Infusions:   PRN Meds:.acetaminophen, fentaNYL (SUBLIMAZE) injection, labetalol, melatonin, ondansetron (ZOFRAN) IV, polyethylene glycol, prochlorperazine   I have personally reviewed following labs and imaging studies  LABORATORY DATA:  Recent Labs  Lab 09/28/20 0935 09/29/20 0435 10/01/20 0058 10/02/20 0148 10/03/20 0255 10/04/20 0049  WBC 5.2 6.3 7.1 6.1 5.5 4.8   HGB 10.7* 11.3* 11.1* 10.7* 10.5* 10.4*  HCT 33.2* 34.5* 33.2* 32.1* 31.8* 31.9*  PLT 119* 121* 125* 130* 125* 143*  MCV 98.2 96.9 95.1 95.5 96.1 96.4  MCH 31.7 31.7 31.8 31.8 31.7 31.4  MCHC 32.2 32.8 33.4 33.3 33.0 32.6  RDW 16.5* 16.2* 15.6* 15.6* 15.1 14.7  LYMPHSABS 1.1  --   --  1.3 1.1 0.7  MONOABS 0.4  --   --  0.6 0.5 0.1  EOSABS 0.1  --   --  0.2 0.2 0.0  BASOSABS 0.1  --   --  0.1 0.1 0.0    Recent Labs  Lab 09/28/20 0935 09/28/20 0936 09/28/20 1532 09/30/20 0242 09/30/20 0830 10/01/20 0058 10/02/20 0148 10/03/20 0255 10/04/20 0049  NA 135  --    < > 135  --  136 133* 131* 129*  K 4.3  --    < > 4.3  --  4.0 3.7 3.4* 4.4  CL 102  --    < > 102  --  102 97* 96* 97*  CO2 22  --    < > 26  --  25 27 28 25   GLUCOSE 125*  --    < > 115*  --  104* 120* 125*  217*  BUN 37*  --    < > 20  --  18 21 21  28*  CREATININE 2.62*  --    < > 1.85*  --  1.73* 1.85* 1.81* 1.80*  CALCIUM 9.3  --    < > 9.1  --  8.8* 8.8* 8.8* 9.0  AST 14*  --   --   --   --   --  14* 13* 14*  ALT 7  --   --   --   --   --  6 6 7   ALKPHOS 86  --   --   --   --   --  70 76 75  BILITOT 0.7  --   --   --   --   --  0.8 1.1 0.9  ALBUMIN 3.7  --   --   --   --   --  3.0* 3.0* 2.9*  MG  --   --   --   --   --   --  1.3* 1.8 2.0  INR  --   --    < >  --  2.6* 2.2* 1.6* 1.3* 1.2  TSH  --  1.771  --   --   --   --   --   --   --   BNP  --   --   --   --   --  470.8* 420.3* 180.8* 318.8*   < > = values in this interval not displayed.         RADIOLOGY STUDIES/RESULTS: CT HEAD WO CONTRAST (5MM)  Result Date: 10/02/2020 CLINICAL DATA:  Nystagmus, possible lymphoma of brain EXAM: CT HEAD WITHOUT CONTRAST TECHNIQUE: Contiguous axial images were obtained from the base of the skull through the vertex without intravenous contrast. COMPARISON:  09/24/2020, 09/29/2020 FINDINGS: Brain: No acute infarct or hemorrhage. Lateral ventricles and midline structures are stable. 8 mm hyperdense area in the region of the  cerebral aqueduct just inferior to the calcified pineal gland corresponds to the abnormal area of enhancement on recent MRI. No acute extra-axial fluid collections. No mass effect. Vascular: Stable atherosclerosis.  No hyperdense vessel. Skull: Normal. Negative for fracture or focal lesion. Sinuses/Orbits: No acute finding. Other: None. IMPRESSION: 1. No acute infarct or hemorrhage. 2. Subtle hyperdense area in the region of the cerebral aqueduct corresponds to the enhancing lesion seen on recent MRI, suspicious for malignancy. Electronically Signed   By: Randa Ngo M.D.   On: 10/02/2020 22:36   CT CHEST WO CONTRAST  Result Date: 10/04/2020 CLINICAL DATA:  87 year old male with history of recurrent non-Hodgkin's lymphoma. Staging examination. EXAM: CT CHEST WITHOUT CONTRAST TECHNIQUE: Multidetector CT imaging of the chest was performed following the standard protocol without IV contrast. COMPARISON:  CT the chest, abdomen and pelvis 07/23/2019. FINDINGS: Cardiovascular: Heart size is normal. There is no significant pericardial fluid, thickening or pericardial calcification. There is aortic atherosclerosis, as well as atherosclerosis of the great vessels of the mediastinum and the coronary arteries, including calcified atherosclerotic plaque in the left main, left anterior descending, left circumflex and right coronary arteries. Calcifications of the aortic valve and mild calcifications of the mitral annulus. Right internal jugular single-lumen porta cath with tip terminating in the superior aspect of the right atrium. Mediastinum/Nodes: No pathologically enlarged mediastinal or hilar lymph nodes. Please note that accurate exclusion of hilar adenopathy is limited on noncontrast CT scans. Esophagus is unremarkable in appearance. No axillary lymphadenopathy. Lungs/Pleura: No suspicious  appearing pulmonary nodules or masses are noted. No acute consolidative airspace disease. No pleural effusions. Areas of  chronic post infectious or inflammatory scarring are noted in the lung bases bilaterally (left greater than right). Upper Abdomen: Aortic atherosclerosis. 4.3 cm low-attenuation lesion in the anterior aspect of the interpolar region of the left kidney, incompletely characterized on today's non-contrast CT examination, but previously characterized as a simple cyst. Status post cholecystectomy. Musculoskeletal: There are no aggressive appearing lytic or blastic lesions noted in the visualized portions of the skeleton. IMPRESSION: 1. No lymphadenopathy or other findings to suggest recurrent lymphoma in the thorax. 2. No acute findings. 3. Aortic atherosclerosis, in addition to left main and 3 vessel coronary artery disease. 4. There are calcifications of the aortic valve and mitral annulus. Echocardiographic correlation for evaluation of potential valvular dysfunction may be warranted if clinically indicated. Aortic Atherosclerosis (ICD10-I70.0). Electronically Signed   By: Vinnie Langton M.D.   On: 10/04/2020 09:07     LOS: 6 days   Signature  Lala Lund M.D on 10/04/2020 at 11:23 AM   -  To page go to [email or web address]

## 2020-10-04 NOTE — Progress Notes (Signed)
Radiation Oncology         [phone]) [phone] ________________________________  Inpatient Re-Consultation  Name: Gary Yates. MRN: [id number]  Date: 10/04/2020  DOB: 05/02/1933  [id number], Alinda Sierras, MD  Volanda Napoleon, MD   REFERRING PHYSICIAN: Volanda Napoleon, MD  DIAGNOSIS: The encounter diagnosis was Diffuse non-Hodgkin's lymphoma of testis [facility]  History of relapsed non-Hodgkin's lymphoma that has been in remission for several years ; now with new enhancing brain lesion suspicious for malignancy  (History of stage 3 chronic kidney disease)  HISTORY OF PRESENT ILLNESS::Gary Yates. is a 87 y.o. 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   The patient recently presented to the ED on 09/28/20 with a 3 week history of occipital headache with neck pain as well as nausea and vomiting.  He stated that the headache and nausea and vomiting is worse when he first wakes up and also after meals. He normally ambulates with a walker and has a shuffling gait but his daughter stated it has been worse in the past several weeks.  He has had several falls due to unsteady gait in the past month and reported that he hit his head on a cabinet several weeks ago. UA taken during ED course was negative. (The patient has been followed by Dr. Marin Olp. During follow-up with Dr. Marin Olp on 09/14/20, the patient was noted to have lost quite a bit of weight from not eating, patient also reported nausea and headaches at this time).   MRI of the brain taken during hospital course on 09/29/20 demonstrated a mass-like enhancement in the region of the cerebral aqueduct measuring up to 0.9 cm, and nodular enhancement along the ventral aspect of fourth ventricle and multiple cranial nerves. These findings were noted as most suspicious for leptomeningeal spread of Malignancy.  CT of the head without contrast on 10/02/20 further  demonstrated the subtle hyperdense area in the region of the cerebral aqueduct; corresponding to the enhancing lesion seen on MRI (again noted as suspicious for malignancy).   Cerebral spinal tap performed on 10/03/20 revealed no malignant cells identified.  Chest CT taken today revealed no lymphadenopathy or other findings to suggest recurrent lymphoma in the thorax. Overall, no acute findings were visualized.   Of note: Patient previously was evaluated in the ED on 9/18 for at least 2 weeks of nausea, vomiting and headache.  He had no leukocytosis and stable hemoglobin.  CT head was negative for acute findings.  He was treated with antiemetics and IV fluid resuscitation with improvement and was discharged. Prior to this, he was diagnosed with UTI with oncology on 09/14/20.  PREVIOUS RADIATION THERAPY: Yes (treated by myself)  Radiation treatment dates:   08/07/2016 to 08/20/2016 Site/dose:   The left adrenal gland mass was treated to 30 Gy in 10 fractions at 3 Gy per fraction. Beams/energy:   3-D // 10X, 15X  Radiation treatment dates:   02/19/16 - 03/13/16 Site/dose:   Testis/Scrotum: 32.4 Gy in 18 fractions Beams/energy:   3D // 15X, 6X Photon   PAST MEDICAL HISTORY:  Past Medical History:  Diagnosis Date   Anemia in chronic renal disease 05/07/2017   Anxiety    Atrial fibrillation ([place])    COPD (chronic obstructive pulmonary disease) ([place])    pt. denies   Coronary artery disease    a. h/o Overlapping stents RCA;  b. 06/2011 Cath:  patent stents, nonobs dzs, NL EF.   Diabetic peripheral neuropathy (H[facility])    Diffuse non-Hodgkin's lymphoma of testis ([place]) 09/28/2015   DM (diabetes mellitus) ([facility])    Type 2, peripheral neuropathy.   Dyspnea    with exertion   Dysrhythmia    GERD (gastroesophageal reflux disease)    Headache    History of bronchitis    History of kidney stones    History of radiation therapy 02/19/16 - 03/13/16   Testis/Scrotum: 32.4 Gy in 18 fractions   History of  radiation therapy 08/07/16-08/20/16   left adrenal gland mass treated to 30 Gy in 10 fractions   Hyperlipidemia    Hypertension    Iron deficiency anemia due to chronic blood loss 08/08/2017   Low testosterone    Nephrolithiasis    OSA (obstructive sleep apnea) 11/26/2017   Osteoarthritis    shoulder   Restless leg    SVT (supraventricular tachycardia) ([place])    Urinary frequency    Wears partial dentures    upper and lower    PAST SURGICAL HISTORY: Past Surgical History:  Procedure Laterality Date   [place]  01/2013   CATARACT EXTRACTION, BILATERAL     CHOLECYSTECTOMY     COLONOSCOPY     CORONARY ANGIOPLASTY  2004   CYSTOSCOPY N/A 08/18/2017   Procedure: CYSTOSCOPY WITH FULGURATION AND SUPRA PUBIC TUBE PLACEMENT;  Surgeon: Kathie Rhodes, MD;  Location: [facility];  Service: Urology;  Laterality: N/A;   EYE SURGERY Bilateral    cataracts   IR GENERIC HISTORICAL  10/05/2015   IR US GUIDE VASC ACCESS RIGHT 10/05/2015 Marybelle Killings, MD [facility]   IR GENERIC HISTORICAL  10/05/2015   IR FLUORO GUIDE PORT INSERTION RIGHT 10/05/2015 Marybelle Killings, MD [facility]   LEFT HEART CATHETERIZATION WITH CORONARY ANGIOGRAM N/A 06/18/2011   Procedure: LEFT HEART CATHETERIZATION WITH CORONARY ANGIOGRAM;  Surgeon: Peter M Martinique, MD;  Location: [facility] CATH LAB;  Service: Cardiovascular;  Laterality: N/A;   LEFT HEART CATHETERIZATION WITH CORONARY ANGIOGRAM N/A 01/27/2013   Procedure: LEFT HEART CATHETERIZATION WITH CORONARY ANGIOGRAM;  Surgeon: Burnell Blanks, MD;  Location: [facility] CATH LAB;  Service: Cardiovascular;  Laterality: N/A;   LUMBAR LAMINECTOMY/DECOMPRESSION MICRODISCECTOMY N/A 02/07/2015   Procedure: Lumbar three-Sacral one Decompression;  Surgeon: Kevan Ny Ditty, MD;  Location: [place] [facility]URO ORS;  Service: Neurosurgery;  Laterality: N/A;  L3 to S1 Decompression   MULTIPLE TOOTH EXTRACTIONS     ORCHIECTOMY Right 09/01/2015   Procedure:  RIGHT ORCHIECTOMY;  Surgeon: Kathie Rhodes, MD;  Location: [facility];  Service: Urology;  Laterality: Right;   port a cath in place      ROTATOR CUFF REPAIR Left     FAMILY HISTORY:  Family History  Problem Relation Age of Onset   Alzheimer's disease Mother    Heart disease Mother    Heart disease Father    Migraines Father    Ulcers Father    Prostate cancer Brother    Coronary artery disease Other        Male 1st degree relative <50   Coronary artery disease Other        male 1st degree relative <60   Heart disease Sister    Obesity Sister        Morbid   Arthritis Sister    Heart disease Brother    Arthritis Brother    Sleep apnea Son  Obesity Son    Migraines Daughter    Thyroid disease Daughter     SOCIAL HISTORY:  Social History   Tobacco Use   Smoking status: Former    Packs/day: 1.50    Years: 20.00    Pack years: 30.00    Types: Cigarettes    Quit date: 04/05/1978    Years since quitting: 42.5   Smokeless tobacco: Never  Vaping Use   Vaping Use: Never used  Substance Use Topics   Alcohol use: No   Drug use: No    ALLERGIES:  Allergies  Allergen Reactions   Latex Other (See Comments)    Reddens the skin   Ace Inhibitors Cough   Codeine Nausea Only and Rash        Compazine [Prochlorperazine] Anxiety   Penicillins Rash    Childhood allergy Has patient had a PCN reaction causing immediate rash, facial/tongue/throat swelling, SOB or lightheadedness with hypotension: Yes Has patient had a PCN reaction causing severe rash involving mucus membranes or skin necrosis: Yes Has patient had a PCN reaction that required hospitalization No Has patient had a PCN reaction occurring within the last 10 years: No If all of the above answers are "NO", then may proceed with Cephalosporin use.     MEDICATIONS:  No current facility-administered medications for this encounter.   No current outpatient medications on file.   Facility-Administered Medications  Ordered in Other Encounters  Medication Dose Route Frequency Provider Last Rate Last Admin   (feeding supplement) PROSource Plus liquid 30 mL  30 mL Oral TID BM Thurnell Lose, MD   30 mL at 10/04/20 1448   acetaminophen (TYLENOL) tablet 650 mg  650 mg Oral Q6H PRN Tu, Ching T, DO   650 mg at 10/03/20 0930   atorvastatin (LIPITOR) tablet 40 mg  40 mg Oral Daily Tu, Ching T, DO   40 mg at 10/04/20 5852   cefTRIAXone (ROCEPHIN) 1 g in sodium chloride 0.9 % 100 mL IVPB  1 g Intravenous Q24H Lala Lund K, MD 200 mL/hr at 10/04/20 1218 1 g at 10/04/20 1218   Chlorhexidine Gluconate Cloth 2 % PADS 6 each  6 each Topical Daily Jonetta Osgood, MD   6 each at 10/04/20 0544   dexamethasone (DECADRON) injection 20 mg  20 mg Intravenous Daily Gardiner Barefoot, NP   20 mg at 10/04/20 7782   diazepam (VALIUM) tablet 2 mg  2 mg Oral BID Thurnell Lose, MD       escitalopram (LEXAPRO) tablet 10 mg  10 mg Oral Daily Tu, Ching T, DO   10 mg at 10/04/20 4235   feeding supplement (BOOST / RESOURCE BREEZE) liquid 1 Container  1 Container Oral TID BM Thurnell Lose, MD   1 Container at 10/04/20 1449   fentaNYL (SUBLIMAZE) injection 12.5 mcg  12.5 mcg Intravenous Q6H PRN Tu, Ching T, DO   12.5 mcg at 36/14/43 1540   folic acid (FOLVITE) tablet 2 mg  2 mg Oral Daily Volanda Napoleon, MD   2 mg at 10/04/20 0867   gabapentin (NEURONTIN) capsule 600 mg  600 mg Oral BID Tu, Ching T, DO   600 mg at 10/04/20 6195   insulin aspart (novoLOG) injection 0-5 Units  0-5 Units Subcutaneous QHS Rai, Ripudeep K, MD   3 Units at 10/03/20 2125   insulin aspart (novoLOG) injection 0-9 Units  0-9 Units Subcutaneous TID WC Rai, Ripudeep K, MD   2 Units at 10/04/20  1224   labetalol (NORMODYNE) injection 5 mg  5 mg Intravenous Q4H PRN Tu, Ching T, DO       lactated ringers infusion   Intravenous Continuous Thurnell Lose, MD 100 mL/hr at 10/04/20 1214 New Bag at 10/04/20 1214   levothyroxine (SYNTHROID) tablet 25 mcg   25 mcg Oral Q0600 Tu, Ching T, DO   25 mcg at 10/04/20 0516   melatonin tablet 10 mg  10 mg Oral QHS PRN Vernelle Emerald, MD   10 mg at 10/03/20 2356   metoCLOPramide (REGLAN) injection 5 mg  5 mg Intravenous Q6H Rai, Ripudeep K, MD   5 mg at 10/04/20 1213   multivitamin with minerals tablet 1 tablet  1 tablet Oral Daily Tu, Ching T, DO   1 tablet at 10/04/20 0823   ondansetron (ZOFRAN) injection 4 mg  4 mg Intravenous Q8H Thurnell Lose, MD   4 mg at 10/04/20 1447   pantoprazole (PROTONIX) EC tablet 40 mg  40 mg Oral Daily Tu, Ching T, DO   40 mg at 10/04/20 1610   polyethylene glycol (MIRALAX / GLYCOLAX) packet 17 g  17 g Oral Daily PRN Tu, Ching T, DO       pramipexole (MIRAPEX) tablet 3 mg  3 mg Oral QHS Tu, Ching T, DO   3 mg at 10/03/20 2114   prochlorperazine (COMPAZINE) injection 5 mg  5 mg Intravenous Q6H PRN Timothy Lasso, MD   5 mg at 10/03/20 1333   promethazine (PHENERGAN) 12.5 mg in sodium chloride 0.9 % 50 mL IVPB  12.5 mg Intravenous Q8H PRN Thurnell Lose, MD       [START ON 10/05/2020] thiamine tablet 100 mg  100 mg Oral Daily Thurnell Lose, MD       vitamin B-12 (CYANOCOBALAMIN) tablet 1,000 mcg  1,000 mcg Oral Daily Tu, Ching T, DO   1,000 mcg at 10/04/20 9604   Warfarin - Pharmacist Dosing Inpatient   Does not apply q1600 Thurnell Lose, MD        REVIEW OF SYSTEMS:  A 10+ POINT REVIEW OF SYSTEMS WAS OBTAINED including neurology, dermatology, psychiatry, cardiac, respiratory, lymph, extremities, GI, GU, musculoskeletal, constitutional, reproductive, HEENT.  Admitting symptoms as above   PHYSICAL EXAM: Patient not examined today.  Will be examined if patient is transferred to [facility] long and radiation therapy indicated.   LABORATORY DATA:  Lab Results  Component Value Date   WBC 4.8 10/04/2020   HGB 10.4 (L) 10/04/2020   HCT 31.9 (L) 10/04/2020   MCV 96.4 10/04/2020   PLT 143 (L) 10/04/2020   NEUTROABS 4.0 10/04/2020   Lab Results  Component Value  Date   NA 129 (L) 10/04/2020   K 4.4 10/04/2020   CL 97 (L) 10/04/2020   CO2 25 10/04/2020   GLUCOSE 217 (H) 10/04/2020   CREATININE 1.80 (H) 10/04/2020   CALCIUM 9.0 10/04/2020      RADIOGRAPHY: CT ABDOMEN PELVIS WO CONTRAST  Result Date: 09/28/2020 CLINICAL DATA:  Nausea, vomiting, emesis EXAM: CT ABDOMEN AND PELVIS WITHOUT CONTRAST TECHNIQUE: Multidetector CT imaging of the abdomen and pelvis was performed following the standard protocol without IV contrast. COMPARISON:  07/23/2019 FINDINGS: Lower chest: No acute abnormality. Cardiomegaly. Coronary artery calcifications. Hepatobiliary: No focal liver abnormality is seen. Status post cholecystectomy. No biliary dilatation. Pancreas: Unremarkable. No pancreatic ductal dilatation or surrounding inflammatory changes. Spleen: Normal in size without significant abnormality. Adrenals/Urinary Tract: Adrenal glands are unremarkable. Nine bilateral renal cysts, including  a high attenuation hemorrhagic or proteinaceous cyst of the posterior midportion of the right kidney. Kidneys are otherwise normal, without renal calculi, solid lesion, or hydronephrosis. Suprapubic catheter. Stomach/Bowel: Stomach is within normal limits. Appendix appears normal. No evidence of bowel wall thickening, distention, or inflammatory changes. Vascular/Lymphatic: Aortic atherosclerosis. No enlarged abdominal or pelvic lymph nodes. Reproductive: Prostatomegaly. Other: No abdominal wall hernia or abnormality. No abdominopelvic ascites. Musculoskeletal: No acute or significant osseous findings. IMPRESSION: 1. No acute noncontrast CT findings of the abdomen or pelvis to explain nausea or vomiting. 2. Suprapubic catheter. 3. Prostatomegaly. 4. Coronary artery disease. Aortic Atherosclerosis (ICD10-I70.0). Electronically Signed   By: Eddie Candle M.D.   On: 09/28/2020 11:14   CT HEAD WO CONTRAST (5MM)  Result Date: 10/02/2020 CLINICAL DATA:  Nystagmus, possible lymphoma of brain EXAM:  CT HEAD WITHOUT CONTRAST TECHNIQUE: Contiguous axial images were obtained from the base of the skull through the vertex without intravenous contrast. COMPARISON:  09/24/2020, 09/29/2020 FINDINGS: Brain: No acute infarct or hemorrhage. Lateral ventricles and midline structures are stable. 8 mm hyperdense area in the region of the cerebral aqueduct just inferior to the calcified pineal gland corresponds to the abnormal area of enhancement on recent MRI. No acute extra-axial fluid collections. No mass effect. Vascular: Stable atherosclerosis.  No hyperdense vessel. Skull: Normal. Negative for fracture or focal lesion. Sinuses/Orbits: No acute finding. Other: None. IMPRESSION: 1. No acute infarct or hemorrhage. 2. Subtle hyperdense area in the region of the cerebral aqueduct corresponds to the enhancing lesion seen on recent MRI, suspicious for malignancy. Electronically Signed   By: Randa Ngo M.D.   On: 10/02/2020 22:36   CT Head Wo Contrast  Result Date: 09/24/2020 CLINICAL DATA:  Headache.  Double vision. EXAM: CT HEAD WITHOUT CONTRAST TECHNIQUE: Contiguous axial images were obtained from the base of the skull through the vertex without intravenous contrast. COMPARISON:  CT head 07/23/2019. FINDINGS: Brain: No evidence of acute infarction, hemorrhage, hydrocephalus, extra-axial collection or mass lesion/mass effect. Again seen is mild diffuse atrophy, unchanged. There is stable mild patchy periventricular and deep white matter hypodensity, likely chronic small vessel ischemic change. Small calcified meningioma measuring 7 mm overlying the right tentorium appears unchanged from the prior exam. Vascular: Atherosclerotic calcifications are present within the cavernous internal carotid arteries. Skull: Normal. Negative for fracture or focal lesion. Sinuses/Orbits: No acute finding. Other: None. IMPRESSION: No acute intracranial abnormality. Electronically Signed   By: Ronney Asters M.D.   On: 09/24/2020 21:07    CT CHEST WO CONTRAST  Result Date: 10/04/2020 CLINICAL DATA:  87 year old male with history of recurrent non-Hodgkin's lymphoma. Staging examination. EXAM: CT CHEST WITHOUT CONTRAST TECHNIQUE: Multidetector CT imaging of the chest was performed following the standard protocol without IV contrast. COMPARISON:  CT the chest, abdomen and pelvis 07/23/2019. FINDINGS: Cardiovascular: Heart size is normal. There is no significant pericardial fluid, thickening or pericardial calcification. There is aortic atherosclerosis, as well as atherosclerosis of the great vessels of the mediastinum and the coronary arteries, including calcified atherosclerotic plaque in the left main, left anterior descending, left circumflex and right coronary arteries. Calcifications of the aortic valve and mild calcifications of the mitral annulus. Right internal jugular single-lumen porta cath with tip terminating in the superior aspect of the right atrium. Mediastinum/Nodes: No pathologically enlarged mediastinal or hilar lymph nodes. Please note that accurate exclusion of hilar adenopathy is limited on noncontrast CT scans. Esophagus is unremarkable in appearance. No axillary lymphadenopathy. Lungs/Pleura: No suspicious appearing pulmonary nodules or masses are  noted. No acute consolidative airspace disease. No pleural effusions. Areas of chronic post infectious or inflammatory scarring are noted in the lung bases bilaterally (left greater than right). Upper Abdomen: Aortic atherosclerosis. 4.3 cm low-attenuation lesion in the anterior aspect of the interpolar region of the left kidney, incompletely characterized on today's non-contrast CT examination, but previously characterized as a simple cyst. Status post cholecystectomy. Musculoskeletal: There are no aggressive appearing lytic or blastic lesions noted in the visualized portions of the skeleton. IMPRESSION: 1. No lymphadenopathy or other findings to suggest recurrent lymphoma in the  thorax. 2. No acute findings. 3. Aortic atherosclerosis, in addition to left main and 3 vessel coronary artery disease. 4. There are calcifications of the aortic valve and mitral annulus. Echocardiographic correlation for evaluation of potential valvular dysfunction may be warranted if clinically indicated. Aortic Atherosclerosis (ICD10-I70.0). Electronically Signed   By: Vinnie Langton M.D.   On: 10/04/2020 09:07   MR BRAIN WO CONTRAST  Result Date: 09/28/2020 CLINICAL DATA:  Stroke, follow up EXAM: MRI HEAD WITHOUT CONTRAST TECHNIQUE: Multiplanar, multiecho pulse sequences of the brain and surrounding structures were obtained without intravenous contrast. COMPARISON:  Most recent MRI is from 2013 FINDINGS: Brain: There is no acute infarction or intracranial hemorrhage. There is no intracranial mass or significant mass effect. There is no hydrocephalus or extra-axial fluid collection. There is abnormal T2 hyperintensity involving the dorsal midbrain, pons, and medulla. Possible slight effacement of cerebral aqueduct focally. Additional patchy and confluent areas of T2 hyperintensity in the supratentorial white matter are nonspecific but probably reflect chronic microvascular ischemic changes. Prominence of the ventricles and sulci reflects parenchymal volume loss. Vascular: Major vessel flow voids at the skull base are preserved. Skull and upper cervical spine: Normal marrow signal is preserved. Sinuses/Orbits: Minor mucosal thickening. Bilateral lens replacements. Other: Sella is unremarkable.  Mastoid air cells are clear. IMPRESSION: Abnormal signal involving the dorsal brainstem with possible minor mass effect. The location accounts for reported intractable vomiting. Differential is wide and includes toxic/metabolic, infectious/post infectious, demyelinating/inflammatory, and neoplastic etiologies. Postcontrast imaging is recommended. Electronically Signed   By: Macy Mis M.D.   On: 09/28/2020 15:09    MR BRAIN W CONTRAST  Result Date: 09/29/2020 CLINICAL DATA:  Follow-up from prior exam obtained 1 day ago EXAM: MRI HEAD WITH CONTRAST TECHNIQUE: Multiplanar, multiecho pulse sequences of the brain and surrounding structures were obtained with intravenous contrast. CONTRAST:  90mL GADAVIST GADOBUTROL 1 MMOL/ML IV SOLN COMPARISON:  Noncontrast brain MRI obtained 1 day prior FINDINGS: Brain: There is focal masslike enhancement in the region of the cerebral aqueduct measuring 0.9 cm AP by 0.9 cm TV by 0.9 cm cc corresponding to T2/FLAIR signal abnormality seen on the prior study. There is separate nodular enhancement along the anterior aspect of the fourth ventricle more inferiorly. There is nodular appearing enhancement of the cranial nerve 7/8 nerve complexes in the bilateral IAC's. There is abnormal enhancement of the right fifth cranial nerve. There is a homogeneously enhancing lesion along the lateral aspect of the right tentorial leaflet measuring up to 0.8 cm by 0.5 cm corresponding to a calcified lesion present on prior head CTs, most likely reflecting a meningioma. Vascular: The major blood vessels enhance normally. Skull and upper cervical spine: Normal marrow signal. Sinuses/Orbits: Grossly unremarkable. Other: None. IMPRESSION: Masslike enhancement in the region of the cerebral aqueduct measuring up to 0.9 cm, and nodular enhancement along the ventral aspect of fourth ventricle and multiple cranial nerves as above. Findings are most suspicious for  leptomeningeal spread of malignancy. Recommend correlation with lumbar puncture. Electronically Signed   By: Valetta Mole M.D.   On: 09/29/2020 11:53   DG CHEST PORT 1 VIEW  Result Date: 10/01/2020 CLINICAL DATA:  Shortness of breath EXAM: PORTABLE CHEST 1 VIEW COMPARISON:  August 07, 2019 FINDINGS: The cardiomediastinal silhouette is unchanged in contour.RIGHT chest port tip terminating over the superior cavoatrial junction. RIGHT-sided skinfold. No  pleural effusion. No pneumothorax. Unchanged LEFT basilar atelectasis/scar. No acute pleuroparenchymal abnormality. Visualized abdomen is unremarkable. Multilevel degenerative changes of the thoracic spine. IMPRESSION: LEFT basilar atelectasis Electronically Signed   By: Valentino Saxon M.D.   On: 10/01/2020 11:46      IMPRESSION: History of relapsed non-Hodgkin's lymphoma that has been in remission for several years ; now with new enhancing brain lesion suspicious for malignancy  History of stage 3 chronic kidney disease  Clinical presentation and brain MRI findings are suspicious for leptomeningeal relapse.  However confusing this picture is the fact that CSF cytology did not show any malignant cells.    PLAN: Treatment plan pending further study results and evaluation.  If findings are convincing for relapse in the brain then radiation therapy would be indicated. With the patient's advanced age and other medical issues, I do not feel craniospinal radiation therapy would be indicated in this situation.      ------------------------------------------------  Blair Promise, PhD, MD  This document serves as a record of services personally performed by [name], MD. It was created on his behalf by Roney Mans, a trained medical scribe. The creation of this record is based on the scribe's personal observations and the provider's statements to them. This document has been checked and approved by the attending provider.

## 2020-10-04 NOTE — Progress Notes (Signed)
Subjective: Continues having more difficulty hearing  Exam: Vitals:   10/04/20 0507 10/04/20 0742  BP: 113/62 132/67  Pulse: 66 68  Resp: 18 17  Temp: 98 F (36.7 C) 98 F (36.7 C)  SpO2: 93% 96%   Gen: In bed, NAD Resp: non-labored breathing, no acute distress Abd: soft, nt  Neuro: MS: Awake, alert, follows commands readily CN: Pupils are reactive bilaterally, he has a conjugate gaze deficit to the right, he has significant multidirectional nystagmus Motor: He moves all extremities well Sensory: Reduced to light touch in the left arm  Pertinent Labs: CSF WBC 34 CSF RBC 1 Protein 138 Glucose 27 Cytology pending  Impression: 87 year old male with history of diffuse non-Hodgkin's lymphoma of the testes 2007 s/p radiation and chemotherapy. Relapse with chemo 3 years ago. MRI with findings of mass vs infection.  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  This CSF profile could conceivably be fungal meningitis as well, and therefore I have sent fungal cultures and stain.  We will await cytology.  He has evidence of both central(gaze palsy) and cranial nerve dysfunction, I suspect that his vertigo is related to eight cranial nerve dysfunction.  This could also be the source of much of his nausea.  Recommendations: 1) await cytology 2) lymphoma treatment per oncology 3) could consider benzodiazepines or meclizine to try and help with nausea.  Roland Rack, MD [facility] [phone]  If 7pm- 7am, please page neurology on call as listed in [place].

## 2020-10-04 NOTE — Consult Note (Addendum)
Palliative Care Consult Note                                  Date: 10/04/2020   Patient Name: Gary Yates.  DOB: [birth year]  MRN: [id number]  Age / Sex: 87 y.o., male  PCP: [name], MD Referring Physician: Thurnell Lose, MD  Reason for Consultation: Establishing goals of care  HPI/Patient Profile: 87 y.o. 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 fib, COPD, CAD, diabetes, and others who was admitted on 09/28/2020 with headahces, N/V. 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 Conversations with radiation oncology for possible radiation treatment, considering Rituxan and ibrutinib systemic therapy. Awaiting cytology report from LP.  PMT was consulted for [place].  Past Medical History:  Diagnosis Date   Anemia in chronic renal disease 05/07/2017   Anxiety    Atrial fibrillation (HCC)    COPD (chronic obstructive pulmonary disease) ([place])    pt. denies   Coronary artery disease    a. h/o Overlapping stents RCA;  b. 06/2011 Cath: patent stents, nonobs dzs, NL EF.   Diabetic peripheral neuropathy (HCC)    Diffuse non-Hodgkin's lymphoma of testis ([place]) 09/28/2015   DM (diabetes mellitus) ([place])    Type 2, peripheral neuropathy.   Dyspnea    with exertion   Dysrhythmia    GERD (gastroesophageal reflux disease)    Headache    History of bronchitis    History of kidney stones    History of radiation therapy 02/19/16 - 03/13/16   Testis/Scrotum: 32.4 Gy in 18 fractions   History of radiation therapy 08/07/16-08/20/16   left adrenal gland mass treated to 30 Gy in 10 fractions    Hyperlipidemia    Hypertension    Iron deficiency anemia due to chronic blood loss 08/08/2017   Low testosterone    Nephrolithiasis    OSA (obstructive sleep apnea) 11/26/2017   Osteoarthritis    shoulder   Restless leg    SVT (supraventricular tachycardia) (HCC)    Urinary frequency    Wears partial dentures    upper and lower    Subjective:   This NP Walden Field reviewed medical records, received report from team, assessed the patient and then meet at the patient's bedside to discuss diagnosis, prognosis, GOC, EOL wishes disposition and options.  I met with the patient and his son [name]) at the bedside.   Concept of Palliative Care was introduced as specialized medical care for people and their families living with serious illness.  If focuses on providing relief from the symptoms and stress of a serious illness.  The goal is to improve quality of life for both the patient and the family. Values and goals of care important to patient and family were attempted to be elicited.  Created space and opportunity for patient  and family to explore thoughts and feelings regarding current medical situation   Natural trajectory and current clinical status were discussed. Questions and concerns addressed. Patient  encouraged to call with questions or concerns.    Patient/Family Understanding of Illness: The patient's son Gary Yates spoke primarily, with the patient occasionally offering  information, given Mr. [place] acute hearing issues due to his disease.  They describe their understanding that the patient had cancer initially in his testicles that was treated twice with remission for a couple years.  He began having hearing difficulties, headache, confusion for the past 3 weeks as well as increased falls.  He came to [place] [facility] and was subsequently transferred to [facility].  They know he had a spinal tap yesterday as well as an MRI and CT which suggest possible cancer spread to his brain.  They saw  the oncologist today who gave him a frank prognosis of about 6 weeks without treatment, possibly 4 to 6 months with treatment.  They know that there are possibilities of medications and/or radiation depending on what pathology shows.  They know he is not eating enough.  The patient states "I want to do everything that I can to live."  Life Review: The patient has a son, Gary Yates, and 2 daughters Gary Yates and Gary Yates).  Family and faith are important to the patient.  Today's Discussion: We discussed and confirmed that there are potential treatment options.  Neurology appears to be on board and trying to manage his symptoms of vertiginous-like symptoms and nausea/vomiting which is likely due to cranial nerve involvement.  I confirmed with the patient that he would like to explore all treatment options.  He at times offered contradictory information.  For example, we discussed CODE STATUS and he states "I want to do everything I can, if there is a chance go for it" but then he later stated "if there is not much chance that they will treat the cancer then I want to stop."  I tried to explain the differences between CODE STATUS and scope of care.  The son seems to have a little more understanding.  We decided that we would like time for discussion amongst family.  In the meantime, continue to treat the treatable and we will leave his CODE STATUS as full code. Provided emotional support, active listening; answered all questions, addressed all concerns.  I left a copy of "Difficult choices for Loving People" book for patient/family to review.   Offered spiritual care consult, which was accepted.  Review of Systems  Constitutional:  Positive for appetite change.  HENT:  Positive for hearing loss.   Respiratory:  Negative for shortness of breath.   Gastrointestinal:  Positive for nausea and vomiting. Negative for abdominal pain.  Neurological:  Positive for dizziness.   Objective:   Primary  Diagnoses: Present on Admission:  Intractable vomiting with nausea  Diffuse non-Hodgkin's lymphoma of testis (HCC)  Diabetic peripheral neuropathy (HCC)  Paroxysmal atrial fibrillation (HCC)  Essential hypertension   Physical Exam Vitals and nursing note reviewed.  Constitutional:      General: He is not in acute distress.    Appearance: He is ill-appearing.  HENT:     Head: Normocephalic and atraumatic.  Eyes:     Extraocular Movements:     Right eye: Nystagmus present.     Left eye: Nystagmus present.  Pulmonary:     Effort: Pulmonary effort is normal. No respiratory distress.  Abdominal:     General: Abdomen is flat.     Palpations: Abdomen is soft.  Skin:    General: Skin is warm and dry.  Neurological:     Mental Status: He is alert.  Psychiatric:        Mood and Affect: Mood normal.  Behavior: Behavior normal.    Vital Signs:  BP 132/67 (BP Location: Right Arm)   Pulse 68   Temp 98 F (36.7 C) (Axillary)   Resp 17   Ht 5' 10"  (1.778 m)   Wt 90 kg   SpO2 96%   BMI 28.47 kg/m   Palliative Assessment/Data: 30%    Advanced Care Planning:   Primary Decision Maker: PATIENT  Code Status/Advance Care Planning: Full code  A discussion was had today regarding advanced directives. Concepts specific to code status, artifical feeding and hydration, continued IV antibiotics and rehospitalization was had.  The difference between a aggressive medical intervention path and a palliative comfort care path for this patient at this time was had.   Decisions/Changes to ACP: Continue full code for now HCPOA is 3 children majority (which would be default legal standing without HCPOA); children all appear to be readily available  Assessment & Plan:   Impression: Present on Admission: Intractable vomiting with nausea Diffuse non-Hodgkin's lymphoma of testis (HCC) Diabetic peripheral neuropathy (HCC) Paroxysmal atrial fibrillation ([place]) Essential  hypertension  Patient with likely/apparent leptomeningeal involvement of his large cell lymphoma.  Treatment options been discussed, final decisions on treatment capabilities pending final pathology.  The patient is having difficulty eating and drinking.  It appears ENT was discussing possibility of core track tube placement for nutrition.  The patient appears to want aggressive care.  He was explained prognosis of 6 weeks without treatment, 4 to 6 months with treatment.  Long-term prognosis poor.  SUMMARY OF RECOMMENDATIONS   Continue to treat the treatable The patient is wanting full scope of care Allow time for family discussions on CODE STATUS Further discussions pending clinical progression PMT will continue to follow  Symptom Management:  Per primary team PMT is available to assist as needed  Prognosis:  < 6 months  Discharge Planning:  To Be Determined   Discussed with: Dr. Julaine Fusi (RN), patient, patient's son Gary Yates    Thank you for allowing [place] to participate in the care of Draedyn Weidinger. PMT will continue to support holistically.  Time Total: 70 min  Greater than 50%  of this time was spent counseling and coordinating care related to the above assessment and plan.  Signed by: Walden Field, NP Palliative Medicine Team  Team Phone # [phone] (Nights/Weekends)  10/04/2020, 8:40 AM

## 2020-10-04 NOTE — Progress Notes (Signed)
Gary Yates had his lumbar puncture yesterday.  The fluid results do look like they are very consistent with a malignant process.  There is a very low glucose level and a high protein level.  We will have to wait the cytology.    He is now on Decadron.  This I would think would help a little bit with the CNS process.  He is still not eating.  He says he still nauseated.  There is still some neurological issues.  His labs show white cell count of 4.8.  Hemoglobin 10.4.  Platelet count 143,000.  His blood sugar is 217.  His BUN is 28 creatinine 1.8.  Potassium 4.4.  Sodium 129.  Again, I have to believe this is going to be CNS relapse of lymphoma.  I will call Dr. Sondra Come who has seen him in the past.  We will see if Dr. Sondra Come would be willing to start radiation on him.  I will try to move him over to [facility] for radiation.  I would also consider Rituxan and ibrutinib for systemic therapy.  He did have his CT scan done this morning.  His son was with him this morning.  We had a long talk.  I do not yet discuss CODE STATUS with him.  I do think that he would be a DO NOT RESUSCITATE.  We will just have to await the cytology report.  I know he has gotten great care from all staff up on [address].  Lattie Haw, MD  Philippians 4:7

## 2020-10-05 ENCOUNTER — Ambulatory Visit: Payer: Medicare Other | Admitting: Radiation Oncology

## 2020-10-05 ENCOUNTER — Other Ambulatory Visit: Payer: Self-pay | Admitting: Radiation Therapy

## 2020-10-05 DIAGNOSIS — R112 Nausea with vomiting, unspecified: Secondary | ICD-10-CM | POA: Diagnosis not present

## 2020-10-05 LAB — COMPREHENSIVE METABOLIC PANEL
ALT: 6 U/L (ref 0–44)
AST: 13 U/L — ABNORMAL LOW (ref 15–41)
Albumin: 2.8 g/dL — ABNORMAL LOW (ref 3.5–5.0)
Alkaline Phosphatase: 68 U/L (ref 38–126)
Anion gap: 7 (ref 5–15)
BUN: 40 mg/dL — ABNORMAL HIGH (ref 8–23)
CO2: 25 mmol/L (ref 22–32)
Calcium: 9.1 mg/dL (ref 8.9–10.3)
Chloride: 101 mmol/L (ref 98–111)
Creatinine, Ser: 1.78 mg/dL — ABNORMAL HIGH (ref 0.61–1.24)
GFR, Estimated: 37 mL/min — ABNORMAL LOW (ref >60)
Glucose, Bld: 157 mg/dL — ABNORMAL HIGH (ref 70–99)
Potassium: 4 mmol/L (ref 3.5–5.1)
Sodium: 133 mmol/L — ABNORMAL LOW (ref 135–145)
Total Bilirubin: 0.7 mg/dL (ref 0.3–1.2)
Total Protein: 5.6 g/dL — ABNORMAL LOW (ref 6.5–8.1)

## 2020-10-05 LAB — GLUCOSE, CAPILLARY
Glucose-Capillary: 132 mg/dL — ABNORMAL HIGH (ref 70–99)
Glucose-Capillary: 217 mg/dL — ABNORMAL HIGH (ref 70–99)
Glucose-Capillary: 266 mg/dL — ABNORMAL HIGH (ref 70–99)
Glucose-Capillary: 270 mg/dL — ABNORMAL HIGH (ref 70–99)

## 2020-10-05 LAB — CSF CELL COUNT WITH DIFFERENTIAL
Eosinophils, CSF: NONE SEEN % (ref 0–1)
Lymphs, CSF: 91 % — ABNORMAL HIGH (ref 40–80)
Monocyte-Macrophage-Spinal Fluid: 7 % — ABNORMAL LOW (ref 15–45)
RBC Count, CSF: 960 /mm3 — ABNORMAL HIGH
Segmented Neutrophils-CSF: 2 % (ref 0–6)
Tube #: 2
WBC, CSF: 31 /mm3 (ref 0–5)

## 2020-10-05 LAB — CBC WITH DIFFERENTIAL/PLATELET
Abs Immature Granulocytes: 0.03 10*3/uL (ref 0.00–0.07)
Basophils Absolute: 0 10*3/uL (ref 0.0–0.1)
Basophils Relative: 0 %
Eosinophils Absolute: 0 10*3/uL (ref 0.0–0.5)
Eosinophils Relative: 0 %
HCT: 30.1 % — ABNORMAL LOW (ref 39.0–52.0)
Hemoglobin: 9.9 g/dL — ABNORMAL LOW (ref 13.0–17.0)
Immature Granulocytes: 0 %
Lymphocytes Relative: 17 %
Lymphs Abs: 1.2 10*3/uL (ref 0.7–4.0)
MCH: 31.5 pg (ref 26.0–34.0)
MCHC: 32.9 g/dL (ref 30.0–36.0)
MCV: 95.9 fL (ref 80.0–100.0)
Monocytes Absolute: 0.5 10*3/uL (ref 0.1–1.0)
Monocytes Relative: 7 %
Neutro Abs: 5.3 10*3/uL (ref 1.7–7.7)
Neutrophils Relative %: 76 %
Platelets: 140 10*3/uL — ABNORMAL LOW (ref 150–400)
RBC: 3.14 MIL/uL — ABNORMAL LOW (ref 4.22–5.81)
RDW: 14.9 % (ref 11.5–15.5)
WBC: 7 10*3/uL (ref 4.0–10.5)
nRBC: 0 % (ref 0.0–0.2)

## 2020-10-05 LAB — PATHOLOGIST SMEAR REVIEW: Path Review: INCREASED

## 2020-10-05 LAB — PROTIME-INR
INR: 1.2 (ref 0.8–1.2)
Prothrombin Time: 14.7 seconds (ref 11.4–15.2)

## 2020-10-05 LAB — PROTEIN AND GLUCOSE, CSF
Glucose, CSF: 75 mg/dL — ABNORMAL HIGH (ref 40–70)
Total  Protein, CSF: 133 mg/dL — ABNORMAL HIGH (ref 15–45)

## 2020-10-05 LAB — MAGNESIUM: Magnesium: 1.8 mg/dL (ref 1.7–2.4)

## 2020-10-05 LAB — BRAIN NATRIURETIC PEPTIDE: B Natriuretic Peptide: 99.8 pg/mL (ref 0.0–100.0)

## 2020-10-05 MED ORDER — WARFARIN SODIUM 5 MG PO TABS
5.0000 mg | ORAL_TABLET | Freq: Once | ORAL | Status: AC
  Administered 2020-10-05: 5 mg via ORAL
  Filled 2020-10-05: qty 1

## 2020-10-05 MED ORDER — LIDOCAINE HCL (CARDIAC) PF 100 MG/5ML IV SOSY
PREFILLED_SYRINGE | INTRAVENOUS | Status: AC
  Filled 2020-10-05: qty 5

## 2020-10-05 NOTE — Procedures (Signed)
LUMBAR PUNCTURE (SPINAL TAP) PROCEDURE NOTE  Indication: CSF sample for cytology, evaluate for CNS lymphoma   Proceduralists: Dr. Leonel Ramsay, Anibal Henderson, NP   Risks of the procedure were dicussed with the patient including post-LP headache, bleeding, infection, weakness/numbness of legs(radiculopathy), death.    Consent obtained from: patient   Procedure Note The patient was prepped and draped, and using sterile technique a 20 gauge quinke spinal needle was inserted in the L4-5 space.    Bony resistance was initially met, and subsequently blood was noticed in the bell, but once CSF was obtained this did clear over the course of the first two tubes.  Subsequent tubes were completely clear.  Approximately 22 cc of CSF were obtained and sent for analysis.  Patient tolerated the procedure well and blood loss was minimal.  NP and MD walked CSF specimens to lab following procedure.     Anibal Henderson, AGAC-NP [facility] Pager: [phone]

## 2020-10-05 NOTE — Progress Notes (Signed)
ANTICOAGULATION CONSULT NOTE - Follow Up Consult  Pharmacy Consult for Warfarin Indication: atrial fibrillation  Allergies  Allergen Reactions   Latex Other (See Comments)    Reddens the skin   Ace Inhibitors Cough   Codeine Nausea Only and Rash        Compazine [Prochlorperazine] Anxiety   Penicillins Rash    Childhood allergy Has patient had a PCN reaction causing immediate rash, facial/tongue/throat swelling, SOB or lightheadedness with hypotension: Yes Has patient had a PCN reaction causing severe rash involving mucus membranes or skin necrosis: Yes Has patient had a PCN reaction that required hospitalization No Has patient had a PCN reaction occurring within the last 10 years: No If all of the above answers are "NO", then may proceed with Cephalosporin use.     Labs: Recent Labs    10/03/20 0255 10/04/20 0049 10/05/20 0302  HGB 10.5* 10.4* 9.9*  HCT 31.8* 31.9* 30.1*  PLT 125* 143* 140*  LABPROT 15.7* 15.0 14.7  INR 1.3* 1.2 1.2  CREATININE 1.81* 1.80* 1.78*     Estimated Creatinine Clearance: 34.2 mL/min (A) (by C-G formula based on SCr of 1.78 mg/dL (H)).  Assessment: Resuming prior to admission warfarin for PAF after LP, INR 1.2 today.  Dose prior to admission 5 mg Monday and Thursday, 2.5 mg all other days  Goal of Therapy:  INR 2-3 Monitor platelets by anticoagulation protocol: Yes   Plan:  Repeat Warfarin 5 mg po x 1 Daily INR  Thank you Anette Guarneri, PharmD 10/05/2020,8:21 AM

## 2020-10-05 NOTE — TOC Progression Note (Signed)
Transition of Care (TOC) - Progression Note    Patient Details  Name: Gary Yates. MRN: [id number] Date of Birth: December 25, 1933  Transition of Care [facility]) CM/SW Contact  Verdell Carmine, RN Phone Number: 10/05/2020, 5:29 PM  Clinical Narrative:    87 year old in with headaches, nausea. LP performed . Awaiting today's results. Patent and family met with palliative care to speak about [place]. Today's LP will decide if they take a palliative route versus more aggressive one. Oncology is consulted. CM will follow for needs recommendations, and transitions.       Expected Discharge Plan and Services                                                 Social Determinants of Health (SDOH) Interventions    Readmission Risk Interventions Readmission Risk Prevention Plan 08/10/2019  Transportation Screening Complete  Medication Review ([place]) Complete  PCP or Specialist appointment within 3-5 days of discharge Complete  HRI or Home Care Consult Complete  SW Recovery Care/Counseling Consult Complete  Palliative Care Screening Not [place] Complete  Some recent data might be hidden

## 2020-10-05 NOTE — Progress Notes (Signed)
Patient ID: [name]., male   DOB: Sep 18, 1933, 87 y.o.   MRN: [id number]    Progress Note from the Palliative Medicine Team at [facility]   Patient Name: Gary Yates.        Date: 10/05/2020 DOB: 1933-02-06  Age: 87 y.o. MRN#: [id number] Attending Physician: Thurnell Lose, MD Primary Care Physician: [name], MD Admit Date: 09/28/2020   Medical records reviewed   87 y.o. 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 fib, COPD, CAD, diabetes, and others who was admitted on 09/28/2020 with headahces, N/V. 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 LP completed yesterday/inconclusive,  suspicious for CNS relapse of lymphoma. Conversations with radiation oncology for possible radiation treatment, considering Rituxan and ibrutinib systemic therapy. Awaiting cytology report from repeat LP.    Patient and family face treatment option decisions, advanced directive decisions and anticipatory care needs.    This NP visited patient at the bedside as a follow up to  yesterday's [place], and to meet with family at bedside for continued conversation regarding current medical situation.  I met today with patient's son and 2 daughters and in-laws.   Patient is alert and oriented and participating fully in today's conversation.  Conversation had regarding current medical situation.  Reviewed patient's history of diffuse large cell non-Hodgkin's lymphoma, and now possible leptomeningeal involvement on neuroimaging.  Unfortunately LP from yesterday was inconclusive and a repeat LP was completed this afternoon.   Education was offered regarding advanced directives.  Concepts specific to code status,  artifical feeding and hydration, continued IV antibiotics and rehospitalization was had.  The difference between a aggressive medical intervention path  and a palliative comfort care path for this patient at this time was had.    Patient has made decision for DNR/DNI status today, family supports decision    Attempted to elicit values and goals of care important to Gary Yates and his family.  Explored what quality of life means  to this patient.  He hopes for continued  life however quality and independence are priority.  He was living alone prior to this admission  Education offered on transition of care options dependent on decision regarding a full comfort path versus seeking life prolonging measures such as immunotherapy and radiation therapy.  MOST form reviewed but not completed  Education offered on hospice benefit both in the home and residential hospice.  Family is well-informed with hospice benefit having had several family members under hospice benefit recently.  Gary Yates wife only died several months ago on 2020-04-01, he was he main caregiver.  Patient and family await information from most recent LP and input from Dr. Marin Olp before making decisions on next steps in transition of care.    Questions and concerns addressed           Discussed with Dr Candiss Norse  Total time spent on the unit was 75 minutes  Greater than 50% of the time was spent in counseling and coordination of care  Gary Lessen NP  Palliative Medicine Team Team Phone # [phone] Pager [phone]

## 2020-10-05 NOTE — Progress Notes (Addendum)
Neurology Progress Note  Subjective: No acute overnight events  Patient reports improvement in nausea and PO intake  Exam: Vitals:   10/05/20 0410 10/05/20 0738  BP: 116/68 137/79  Pulse:  73  Resp: 16 20  Temp: 98.1 F (36.7 C) (!) 97.5 F (36.4 C)  SpO2:  97%   Gen: Sitting up in bed with family at bedside, in no acute distress Resp: non-labored breathing, no respiratory distress on room air Abd: soft, non-tender, non-distended  Neuro: Mental Status: Awake, alert, follows commands. He is appropriate and interactive.  Remains hard of hearing.  No neglect or aphasia is noted.  Cranial Nerves: PERRL, conjugate gaze deficit to the right, significant multidirectional nystagmus worse in forced lateral gaze, hard of hearing, phonation is normal, shoulders shrug symmetrically, tongue protrudes midline.  Motor: Moves all extremities spontaneously and purposefully without asymmetry.  Sensory: Intact and symmetric to light touch throughout Gait: Deferred  Pertinent Labs: CBC    Component Value Date/Time   WBC 7.0 10/05/2020 0302   RBC 3.14 (L) 10/05/2020 0302   HGB 9.9 (L) 10/05/2020 0302   HGB 10.3 (L) 09/14/2020 1205   HGB 10.5 (L) 01/10/2017 1136   HCT 30.1 (L) 10/05/2020 0302   HCT 31.5 (L) 01/10/2017 1136   PLT 140 (L) 10/05/2020 0302   PLT 155 09/14/2020 1205   PLT 123 (L) 01/10/2017 1136   MCV 95.9 10/05/2020 0302   MCV 95 01/10/2017 1136   MCH 31.5 10/05/2020 0302   MCHC 32.9 10/05/2020 0302   RDW 14.9 10/05/2020 0302   RDW 14.2 01/10/2017 1136   LYMPHSABS 1.2 10/05/2020 0302   LYMPHSABS 0.9 01/10/2017 1136   MONOABS 0.5 10/05/2020 0302   EOSABS 0.0 10/05/2020 0302   EOSABS 0.1 01/10/2017 1136   BASOSABS 0.0 10/05/2020 0302   BASOSABS 0.0 01/10/2017 1136   CMP     Component Value Date/Time   NA 133 (L) 10/05/2020 0302   NA 137 08/18/2019 0000   NA 141 01/10/2017 1136   NA 137 12/14/2015 1100   K 4.0 10/05/2020 0302   K 4.4 01/10/2017 1136   K 3.7  12/14/2015 1100   CL 101 10/05/2020 0302   CL 104 01/10/2017 1136   CO2 25 10/05/2020 0302   CO2 27 01/10/2017 1136   CO2 24 12/14/2015 1100   GLUCOSE 157 (H) 10/05/2020 0302   GLUCOSE 132 (H) 01/10/2017 1136   BUN 40 (H) 10/05/2020 0302   BUN 29 (A) 08/18/2019 0000   BUN 19 01/10/2017 1136   BUN 18.4 12/14/2015 1100   CREATININE 1.78 (H) 10/05/2020 0302   CREATININE 1.98 (H) 09/14/2020 1205   CREATININE 1.76 (H) 10/08/2019 0100   CREATININE 0.9 12/14/2015 1100   CALCIUM 9.1 10/05/2020 0302   CALCIUM 9.4 01/10/2017 1136   CALCIUM 9.5 12/14/2015 1100   PROT 5.6 (L) 10/05/2020 0302   PROT 6.5 01/10/2017 1136   PROT 6.6 12/14/2015 1100   ALBUMIN 2.8 (L) 10/05/2020 0302   ALBUMIN 3.4 01/10/2017 1136   ALBUMIN 3.4 (L) 12/14/2015 1100   AST 13 (L) 10/05/2020 0302   AST 14 (L) 09/14/2020 1205   AST 22 12/14/2015 1100   ALT 6 10/05/2020 0302   ALT 5 09/14/2020 1205   ALT 18 01/10/2017 1136   ALT 10 12/14/2015 1100   ALKPHOS 68 10/05/2020 0302   ALKPHOS 112 (H) 01/10/2017 1136   ALKPHOS 102 12/14/2015 1100   BILITOT 0.7 10/05/2020 0302   BILITOT 0.4 09/14/2020 1205   BILITOT  0.58 12/14/2015 1100   GFRNONAA 37 (L) 10/05/2020 0302   GFRNONAA 32 (L) 09/14/2020 1205   GFRAA 42 (L) 10/11/2019 1525   CSF cytology:  FINAL MICROSCOPIC DIAGNOSIS:  - No malignant cells identified   Tube #  1   Color, CSF COLORLESS COLORLESS   Appearance, CSF CLEAR CLEAR Abnormal    Supernatant  NOT INDICATED   RBC Count, CSF 0 /cu mm 1 High    WBC, CSF 0 - 5 /cu mm 34 High Panic    Comment: CRITICAL RESULT CALLED TO, READ BACK BY AND VERIFIED WITH:  T.BUTLER RN @1642  ON 10/03/2020 BY ZUNIGA,M MT   Segmented Neutrophils-CSF 0 - 6 % 0   Lymphs, CSF 40 - 80 % 90 High    Monocyte-Macrophage-Spinal Fluid 15 - 45 % 10 Low    Eosinophils, CSF 0 - 1 % 0    No new neuroimaging for review  Assessment: 87 year old male with history of diffuse non-Hodgkin's lymphoma of the testes 2007 s/p radiation and  chemotherapy. Relapse with chemo 3 years ago. MRI with findings of mass vs infection.  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 This CSF profile could conceivably be fungal meningitis as well, and therefore I have sent fungal cultures and stain.  Cytology was without evidence of malignant cells, however, due there is concern for false negative cytology. Also, cytology was performed on 3cc of CSF, would like to repeat cytology on a larger quantity of CSF. Repeat LP was discussed with patient and family who are in agreement.    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  This could also be the source of much of his nausea.  Recommendations: - Repeat LP with cytology with higher volume of CSF due to high false negative rates and with ongoing suspicion for CNS lymphoma - Follow up on CSF fungal cultures - Lymphoma treatment per oncology - Neurology will continue to follow  Anibal Henderson, AGACNP-BC [facility] [phone]  I have seen the patient and reviewed the above note.  My suspicion is still primarily for CNS metastasis, though this was not confirmed on initial LP.  We will plan to repeat large-volume tap today.  Roland Rack, MD [facility] [phone]  If 7pm- 7am, please page neurology on call as listed in [place].

## 2020-10-05 NOTE — Progress Notes (Signed)
Surprisingly, the spinal fluid was negative.  I suspect that this indicates that there is no leptomeningeal disease.  He is still very hard of hearing.  I tried to have a talk with him about CODE STATUS.  He just had a hard time understanding what I was saying.  His son was with him.  I appreciate Dr. Sondra Come seeing him.  I know that there is some confusion as to what exactly is going on in the brain.  I really would have thought that the CSF cytology would have been positive.  However, the spinal fluid certainly seems to indicate that there is a malignancy given that there is a low sugar and a high protein.  I am not sure where the studies could be done to help better identify the problem.  I suppose that if Radiation Oncology does not feel comfortable treating him, we could certainly empirically treat him with Rituxan and Imbruvica.  It looks like the steroids helped a little bit.  He does seem to be eating little bit more.  His blood sugars have not been all that bad.  His labs show white cell count of 7 hemoglobin 10 hematocrit 30.1 platelet count 140,000.  His BUN is 40 creatinine 1.78.  CT scan of the chest did not show any evidence of thoracic recurrence.  Again, I have to suspect that we are looking at CNS relapse.  I think the MRI is certainly consistent with this.  Again, his hearing is not that good.  I just wonder if he does not have some cerumen impaction that might be causing some of the hearing issues.  I told him and his son that I really do not think that give him alive on artificial support would be a good idea.  I just do not think that he would come off life support if he were to go on it.  I do appreciate the great care that he is getting from the staff on 5 W.  Lattie Haw, MD  Romans 5:3-5

## 2020-10-05 NOTE — Progress Notes (Signed)
PROGRESS NOTE        PATIENT DETAILS Name: Gary Yates. Age: 87 y.o. Sex: male Date of Birth: 07/08/1933 Admit Date: 09/28/2020 Admitting Physician Orene Desanctis, DO S[facility]:[id number], Alinda Sierras, MD  Brief Narrative: Patient is a 87 y.o. 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  See below for further details.    9/23>>Urine Culture: Pending 9/22: CT abdomen/pelvis: No acute abnormality-prostatomegaly-no enlarged lymph nodes. 9/22: MRI brain-no contrast: T2 hyperintensity involving dorsal midbrain/pons/medulla 9/23>> MRI brain with contrast: Masslike enhancement of the cerebral aqueduct-nodular enhancement along the ventricle and multiple cranial nerves-suspicious for leptomeningeal spread of malignancy 9/27>> LP due  Subjective:  Patient in bed, appears comfortable, denies any headache, no fever, no chest pain or pressure, no shortness of breath , no abdominal pain, positive nausea. No new focal weakness.    Objective:  Vitals:  Blood pressure 137/79, pulse 73, temperature (!) 97.5 F (36.4 C), temperature source Oral, resp. rate 20, height 5\' 10"  (1.778 m), weight 90 kg, SpO2 97 %.   Exam:  Awake Alert, No new F.N deficits, Normal affect [place].AT,PERRAL Supple Neck,No JVD, No cervical lymphadenopathy appriciated.  Symmetrical Chest wall movement, Good air movement bilaterally, CTAB RRR,No Gallops, Rubs or new Murmurs, No Parasternal Heave +ve B.Sounds, Abd Soft, No tenderness, No organomegaly appriciated, No rebound - guarding or rigidity. 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  Headache better-nausea vomiting still an issue will place him on scheduled Valium along with scheduled Zofran and as needed Phenergan and monitor. LP done  10/03/20 >> repeat 10/05/20, cytology negative from the first test, flow cytometry not done likely will be done on 10/05/2020 sample. Oncologist-Dr. Marin Olp following as well, will let oncology decide on course of action after cytology is back.  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  AKI on CKD stage IIIa: AKI likely hemodynamically mediated in the setting of nausea/vomiting-improved with supportive care.  PAF with underlying LBBB: Rate controlled-  resume Coumadin.  Hypothyroidism: Continue Synthroid  Hypokalemia - replaced  HTN: BP controlled-monitor off antihypertensives.  Hyponatremia.  Appears dehydrated.  IV fluids on 10/04/2020.    UTI.  Empiric Rocephin x5 days.    DM-2 (A1c 6.3 on 7/27): CBG stable-on SSI.  Recent Labs    10/04/20 1624 10/04/20 2025 10/05/20 0741  GLUCAP 308* 167* 132*    Peripheral neuropathy: On Neurontin.  Nutrition Status: Nutrition Problem: Predicted suboptimal nutrient intake Etiology: nausea, vomiting Signs/Symptoms: per patient/family report Interventions: Ensure Enlive (each supplement provides 350kcal and 20 grams of protein), Magic cup, MVI  Estimated body mass index is 28.47 kg/m as calculated from the following:   Height as of this encounter: 5\' 10"  (1.778 m).   Weight as of this encounter: 90 kg.    Procedures: None Consults: Neurology, Onco DVT Prophylaxis: SCDs >>Coumadin Code Status:Full code  Family Communication: son bedside 10/01/20, daughter bedside on 10/02/2020, daughter [name] [phone] 10/04/20, son bedside 10/05/20  Time spent: 35 minutes-Greater than 50% of this time was spent in counseling,  explanation of diagnosis, planning of further  management, and coordination of care.  Diet: Diet Order             DIET SOFT Room service appropriate? Yes; Fluid consistency: Thin  Diet effective now                    Disposition Plan: Status is: Inpatient  Remains inpatient appropriate because:Inpatient level of care appropriate due to severity of illness  Dispo: The patient is from: Home              Anticipated d/c is to: Home              Patient currently is not medically stable to d/c.   Difficult to place patient No     Barriers to Discharge: Intractable headache/nausea/vomiting due to leptomeningeal involvement-lumbar puncture scheduled for this coming Monday.  Needs scheduled antiemetics to control symptoms.  Not yet stable for discharge.  Antimicrobial agents: Anti-infectives (From admission, onward)    Start     Dose/Rate Route Frequency Ordered Stop   10/04/20 1230  cefTRIAXone (ROCEPHIN) 1 g in sodium chloride 0.9 % 100 mL IVPB        1 g 200 mL/hr over 30 Minutes Intravenous Every 24 hours 10/04/20 1131 10/09/20 1229        MEDICATIONS: Scheduled Meds:  (feeding supplement) PROSource Plus  30 mL Oral TID BM   atorvastatin  40 mg Oral Daily   Chlorhexidine Gluconate Cloth  6 each Topical Daily   dexamethasone (DECADRON) injection  20 mg Intravenous Daily   diazepam  2 mg Oral BID   escitalopram  10 mg Oral Daily   feeding supplement  1 Container Oral TID BM   folic acid  2 mg Oral Daily   gabapentin  600 mg Oral BID   insulin aspart  0-5 Units Subcutaneous QHS   insulin aspart  0-9 Units Subcutaneous TID WC   levothyroxine  25 mcg Oral Q0600   metoCLOPramide (REGLAN) injection  5 mg Intravenous Q6H   multivitamin with minerals  1 tablet Oral Daily   ondansetron (ZOFRAN) IV  4 mg Intravenous Q8H   pantoprazole  40 mg Oral Daily   pramipexole  3 mg Oral QHS   thiamine  100 mg Oral Daily   vitamin B-12  1,000 mcg Oral Daily   warfarin  5 mg Oral ONCE-1600   Warfarin - Pharmacist Dosing  Inpatient   Does not apply q1600   Continuous Infusions:  cefTRIAXone (ROCEPHIN)  IV 1 g (10/04/20 1218)   lactated ringers 20 mL/hr at 10/05/20 0939   promethazine (PHENERGAN) injection (IM or IVPB)      PRN Meds:.acetaminophen, fentaNYL (SUBLIMAZE) injection, labetalol, melatonin, polyethylene glycol, prochlorperazine, promethazine (PHENERGAN) injection (IM or IVPB)   I have personally reviewed following labs and imaging studies  LABORATORY DATA:  Recent Labs  Lab 10/01/20 0058 10/02/20 0148 10/03/20 0255 10/04/20 0049 10/05/20 0302  WBC 7.1 6.1 5.5 4.8 7.0  HGB 11.1* 10.7* 10.5* 10.4* 9.9*  HCT 33.2* 32.1* 31.8* 31.9* 30.1*  PLT 125* 130* 125* 143* 140*  MCV 95.1 95.5 96.1 96.4 95.9  MCH 31.8 31.8 31.7 31.4 31.5  MCHC 33.4 33.3 33.0 32.6 32.9  RDW 15.6* 15.6* 15.1 14.7 14.9  LYMPHSABS  --  1.3 1.1 0.7 1.2  MONOABS  --  0.6 0.5 0.1 0.5  EOSABS  --  0.2 0.2 0.0 0.0  BASOSABS  --  0.1 0.1 0.0 0.0  Recent Labs  Lab 10/01/20 0058 10/02/20 0148 10/03/20 0255 10/04/20 0049 10/05/20 0302  NA 136 133* 131* 129* 133*  K 4.0 3.7 3.4* 4.4 4.0  CL 102 97* 96* 97* 101  CO2 25 27 28 25 25   GLUCOSE 104* 120* 125* 217* 157*  BUN 18 21 21  28* 40*  CREATININE 1.73* 1.85* 1.81* 1.80* 1.78*  CALCIUM 8.8* 8.8* 8.8* 9.0 9.1  AST  --  14* 13* 14* 13*  ALT  --  6 6 7 6   ALKPHOS  --  70 76 75 68  BILITOT  --  0.8 1.1 0.9 0.7  ALBUMIN  --  3.0* 3.0* 2.9* 2.8*  MG  --  1.3* 1.8 2.0 1.8  INR 2.2* 1.6* 1.3* 1.2 1.2  BNP 470.8* 420.3* 180.8* 318.8* 99.8         RADIOLOGY STUDIES/RESULTS: CT CHEST WO CONTRAST  Result Date: 10/04/2020 CLINICAL DATA:  87 year old male with history of recurrent non-Hodgkin's lymphoma. Staging examination. EXAM: CT CHEST WITHOUT CONTRAST TECHNIQUE: Multidetector CT imaging of the chest was performed following the standard protocol without IV contrast. COMPARISON:  CT the chest, abdomen and pelvis 07/23/2019. FINDINGS: Cardiovascular: Heart size is  normal. There is no significant pericardial fluid, thickening or pericardial calcification. There is aortic atherosclerosis, as well as atherosclerosis of the great vessels of the mediastinum and the coronary arteries, including calcified atherosclerotic plaque in the left main, left anterior descending, left circumflex and right coronary arteries. Calcifications of the aortic valve and mild calcifications of the mitral annulus. Right internal jugular single-lumen porta cath with tip terminating in the superior aspect of the right atrium. Mediastinum/Nodes: No pathologically enlarged mediastinal or hilar lymph nodes. Please note that accurate exclusion of hilar adenopathy is limited on noncontrast CT scans. Esophagus is unremarkable in appearance. No axillary lymphadenopathy. Lungs/Pleura: No suspicious appearing pulmonary nodules or masses are noted. No acute consolidative airspace disease. No pleural effusions. Areas of chronic post infectious or inflammatory scarring are noted in the lung bases bilaterally (left greater than right). Upper Abdomen: Aortic atherosclerosis. 4.3 cm low-attenuation lesion in the anterior aspect of the interpolar region of the left kidney, incompletely characterized on today's non-contrast CT examination, but previously characterized as a simple cyst. Status post cholecystectomy. Musculoskeletal: There are no aggressive appearing lytic or blastic lesions noted in the visualized portions of the skeleton. IMPRESSION: 1. No lymphadenopathy or other findings to suggest recurrent lymphoma in the thorax. 2. No acute findings. 3. Aortic atherosclerosis, in addition to left main and 3 vessel coronary artery disease. 4. There are calcifications of the aortic valve and mitral annulus. Echocardiographic correlation for evaluation of potential valvular dysfunction may be warranted if clinically indicated. Aortic Atherosclerosis (ICD10-I70.0). Electronically Signed   By: Vinnie Langton M.D.   On:  10/04/2020 09:07     LOS: 7 days   Signature  Lala Lund M.D on 10/05/2020 at 10:50 AM   -  To page go to [email or web address]

## 2020-10-06 ENCOUNTER — Ambulatory Visit: Payer: Medicare Other

## 2020-10-06 DIAGNOSIS — R112 Nausea with vomiting, unspecified: Secondary | ICD-10-CM | POA: Diagnosis not present

## 2020-10-06 DIAGNOSIS — E1142 Type 2 diabetes mellitus with diabetic polyneuropathy: Secondary | ICD-10-CM | POA: Diagnosis not present

## 2020-10-06 DIAGNOSIS — N1832 Chronic kidney disease, stage 3b: Secondary | ICD-10-CM

## 2020-10-06 DIAGNOSIS — Z66 Do not resuscitate: Secondary | ICD-10-CM

## 2020-10-06 DIAGNOSIS — N170 Acute kidney failure with tubular necrosis: Secondary | ICD-10-CM | POA: Diagnosis not present

## 2020-10-06 DIAGNOSIS — R1110 Vomiting, unspecified: Secondary | ICD-10-CM | POA: Diagnosis not present

## 2020-10-06 DIAGNOSIS — Z515 Encounter for palliative care: Secondary | ICD-10-CM

## 2020-10-06 DIAGNOSIS — Z7189 Other specified counseling: Secondary | ICD-10-CM

## 2020-10-06 LAB — GLUCOSE, CAPILLARY
Glucose-Capillary: 135 mg/dL — ABNORMAL HIGH (ref 70–99)
Glucose-Capillary: 195 mg/dL — ABNORMAL HIGH (ref 70–99)
Glucose-Capillary: 255 mg/dL — ABNORMAL HIGH (ref 70–99)
Glucose-Capillary: 318 mg/dL — ABNORMAL HIGH (ref 70–99)
Glucose-Capillary: 247 mg/dL — ABNORMAL HIGH (ref 70–99)

## 2020-10-06 LAB — URINE CULTURE: Culture: >=100000 — AB

## 2020-10-06 LAB — CSF CULTURE W GRAM STAIN: Culture: NO GROWTH

## 2020-10-06 LAB — PROTIME-INR
INR: 1.3 — ABNORMAL HIGH (ref 0.8–1.2)
Prothrombin Time: 15.8 seconds — ABNORMAL HIGH (ref 11.4–15.2)

## 2020-10-06 LAB — CYTOLOGY - NON PAP

## 2020-10-06 LAB — FLOW CYTOMETRY REQUEST - FLUID (INPATIENT)

## 2020-10-06 LAB — SURGICAL PATHOLOGY

## 2020-10-06 MED ORDER — WARFARIN SODIUM 7.5 MG PO TABS
7.5000 mg | ORAL_TABLET | Freq: Once | ORAL | Status: AC
  Administered 2020-10-06: 7.5 mg via ORAL
  Filled 2020-10-06: qty 1

## 2020-10-06 NOTE — Progress Notes (Signed)
PT Progress Note  Patient suffers from history of diffuse non-Hodgkin's lymphoma, now presenting with intractable N&V and dizziness (suspected CNS metastasis), which impairs their ability to perform daily activities like ambulation in the home.  A walker alone will not resolve the issues with performing activities of daily living. A wheelchair will allow patient to safely perform daily activities.  The patient can self propel in the home or has a caregiver who can provide assistance.     Lorrin Goodell, PT  Office # [phone] Pager [phone]

## 2020-10-06 NOTE — TOC Initial Note (Signed)
Transition of Care (TOC) - Initial/Assessment Note    Patient Details  Name: Gary Yates. MRN: [id number] Date of Birth: [birth year]  Transition of Care [place]) CM/SW Contact:    Verdell Carmine, RN Phone Number: 10/06/2020, 4:38 PM  Clinical Narrative:                 87 year old patient with a history of NHL admitted with headache irreproachable nausea and volmitng Spoke with patient son, via phone, he is at hte bedside with patient. Palliative has been  consulted and spoke with patient and family. They are waiting for test results from LP to come back, but likely will need home hospice. They have not made that decision yet. Talked to him about different hospices, he will look them up and get mor information about who his mother had for hospice.  PT recommending home health and wheelchair. Wheelchair ordered but not called in as this would change if they elect to go with hospice.  Patient has 1 step outside and  1 step inside, has DME Of walker, BSC and rails in bathroom.  For hospital bed, as he now sleeps on the couch, and wheelchair  Care management will follow for recommendations, needs and transitions  Expected Discharge Plan: Home w Hospice Care Barriers to Discharge: Continued Medical Work up   Patient Goals and CMS Choice        Expected Discharge Plan and Services Expected Discharge Plan: [place] arrangements for the past 2 months: Single Family Home                 DME Arranged:  (Dependent on hospice care vs HH)                    Prior Living Arrangements/Services Living arrangements for the past 2 months: Single Family Home Lives with:: Adult Children Patient language and need for interpreter reviewed:: Yes        Need for Family Participation in Patient Care: Yes (Comment) Care giver support system in place?: Yes (comment)      Activities of Daily Living Home Assistive Devices/Equipment: Eyeglasses, Environmental consultant (specify  type), Bedside commode/3-in-1, Shower chair with back ADL Screening (condition at time of admission) Patient's cognitive ability adequate to safely complete daily activities?: Yes Is the patient deaf or have difficulty hearing?: Yes Does the patient have difficulty seeing, even when wearing glasses/contacts?: Yes (blurry and double vision) Does the patient have difficulty concentrating, remembering, or making decisions?: Yes Patient able to express need for assistance with ADLs?: Yes Does the patient have difficulty dressing or bathing?: No Independently performs ADLs?: No Communication: Independent Dressing (OT): Independent Grooming: Independent Feeding: Independent Bathing: Needs assistance Is this a change from baseline?: Pre-admission baseline Toileting: Needs assistance Is this a change from baseline?: Pre-admission baseline In/Out Bed: Needs assistance Is this a change from baseline?: Pre-admission baseline Walks in Home: Needs assistance Is this a change from baseline?: Pre-admission baseline Does the patient have difficulty walking or climbing stairs?: Yes Weakness of Legs: Both Weakness of Arms/Hands: Both  Permission Sought/Granted                  Emotional Assessment       Orientation: : Oriented to Self, Oriented to Place [facility])   Psych Involvement: No (comment)  Admission diagnosis:  Intractable vomiting with nausea [R11.2] Intractable vomiting, presence of nausea not specified, unspecified vomiting type [R11.10]  Patient Active Problem List   Diagnosis Date Noted   Intractable vomiting with nausea 09/28/2020   Headache 09/28/2020   Acute-on-chronic kidney injury ([place]) 09/28/2020   Hypothyroidism 09/28/2020   COPD (chronic obstructive pulmonary disease) ([place])    Hypercoagulable state due to atrial fibrillation ([place]) 08/13/2019   Suprapubic catheter ([place]) 08/13/2019   History of non-Hodgkin's lymphoma 08/13/2019   Cardiac arrhythmia 08/07/2019    Aortic atherosclerosis ([place]) 08/07/2019   OSA (obstructive sleep apnea) 11/26/2017   Iron deficiency anemia due to chronic blood loss 08/08/2017   Sepsis secondary to UTI ([place]) 06/17/2017   Thrombocytopenia ([place]) 06/17/2017   Urinary retention 06/17/2017   Anemia in chronic renal disease 05/07/2017   V-tach ([place]) 07/02/2016   Protein-calorie malnutrition, severe 07/02/2016   Bilateral lower extremity edema 07/01/2016   Diffuse non-Hodgkin's lymphoma of testis ([place]) 09/28/2015   Lumbosacral spondylosis with radiculopathy 02/07/2015   At high risk for falls 05/05/2014   Encounter for therapeutic drug monitoring 03/15/2013   Chest pain 02/03/2013   Obesity (BMI 30-39.9) 09/21/2012   Type 2 diabetes mellitus with diabetic neuropathy, unspecified ([place]) 07/03/2012   Chronic kidney disease, stage III (moderate) ([place]) 07/03/2012   Restless leg syndrome 12/04/2011   Long term current use of anticoagulant therapy 07/15/2011   Paroxysmal atrial fibrillation ([place]) 06/21/2011   Upper extremity weakness 06/21/2011   Diabetic peripheral neuropathy ([place]) 12/17/2010   Hypotestosteronism 05/07/2010   LEG CRAMPS, IDIOPATHIC 08/14/2009   Insomnia 08/14/2009   Malignant neoplasm of skin of face 08/14/2009   OBESITY 08/02/2009   HYPERTENSION, HEART CONTROLLED W/O ASSOC CHF 03/28/2008   G E R D 01/12/2007   Morbid obesity ([place]) 09/15/2006   Hyperlipidemia, mixed 07/01/2006   Essential hypertension 07/01/2006   Coronary atherosclerosis 07/01/2006   OSTEOARTHRITIS 07/01/2006   PCP:  [name], MD Pharmacy:   [id number] - , [place] - [address] [place] [address] Phone: [phone] Fax: [phone]     Social Determinants of Health (SDOH) Interventions    Readmission Risk Interventions Readmission Risk Prevention Plan 08/10/2019  Transportation Screening Complete  Medication Review ([place]) Complete   PCP or Specialist appointment within 3-5 days of discharge Complete  HRI or Home Care Consult Complete  SW Recovery Care/Counseling Consult Complete  Palliative Care Screening Not Applicable  Skilled Nursing Facility Complete  Some recent data might be hidden

## 2020-10-06 NOTE — Progress Notes (Signed)
Occupational Therapy Treatment Patient Details Name: Gary Yates. MRN: [id number] DOB: Oct 25, 1933 Today's Date: 10/06/2020   History of present illness [name] who presents on 09/28/20 with several week hx of dizziness, headache, N/V. 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 MRI brain with contrast: Masslike enhancement of the cerebral aqueduct-nodular enhancement along the ventricle and multiple cranial nerves-suspicious for leptomeningeal spread of malignancy. s/p LP with cytology/flow cytometry on 9/27. PMH includes A-fib, COPD, CAD, non-Hodgkin's lymphoma in remission, HTN, OSA, DM.   OT comments  Patient received in recliner and asking to return to bed. Patient required mod assist to stand from recliner and assistance to pivot and manage walker.  Patient was mod assist for supine to sit and assistance positioning in wheelchair. Acute OT to continue to follow.    Recommendations for follow up therapy are one component of a multi-disciplinary discharge planning process, led by the attending physician.  Recommendations may be updated based on patient status, additional functional criteria and insurance authorization.    Follow Up Recommendations  Home health OT    Equipment Recommendations       Recommendations for Other Services      Precautions / Restrictions Precautions Precautions: Fall;Other (comment) Precaution Comments: dizziness       Mobility Bed Mobility Overal bed mobility: Needs Assistance Bed Mobility: Sit to Supine       Sit to supine: Mod assist   General bed mobility comments: requried assistance with BLEs    Transfers Overall transfer level: Needs assistance Equipment used: Rolling walker (2 wheeled) Transfers: Sit to/from [facility] Sit to Stand: Mod assist;From elevated surface Stand pivot transfers:  Mod assist       General transfer comment: required assistance with walker and balance    Balance Overall balance assessment: History of Falls;Needs assistance Sitting-balance support: Feet supported;Single extremity supported Sitting balance-Leahy Scale: Fair Sitting balance - Comments: able to sit on eob without support   Standing balance support: Bilateral upper extremity supported;During functional activity Standing balance-Leahy Scale: Poor Standing balance comment: Reliant on UE and external support                           ADL either performed or assessed with clinical judgement   ADL                                               Vision       Perception     Praxis      Cognition Arousal/Alertness: Awake/alert Behavior During Therapy: WFL for tasks assessed/performed Overall Cognitive Status: Within Functional Limits for tasks assessed                                 General Comments: aggitated that he hasn't gone home yet        Exercises     Shoulder Instructions       General Comments      Pertinent Vitals/ Pain       Pain Assessment: No/denies pain  Home Living  Prior Functioning/Environment              Frequency  Min 2X/week        Progress Toward Goals  OT Goals(current goals can now be found in the care plan section)     Acute Rehab OT Goals Patient Stated Goal: to go home OT Goal Formulation: With patient Time For Goal Achievement: 10/16/20 Potential to Achieve Goals: Good ADL Goals Pt Will Perform Grooming: with modified independence;standing Pt Will Perform Upper Body Bathing: with modified independence;sitting Pt Will Perform Lower Body Bathing: with modified independence;sit to/from stand Pt Will Perform Upper Body Dressing: with modified independence;sitting Pt Will Perform Lower Body Dressing: with modified  independence;with adaptive equipment;sit to/from stand Pt Will Transfer to Toilet: with modified independence;ambulating Pt Will Perform Toileting - Clothing Manipulation and hygiene: with modified independence;sit to/from stand  Plan Discharge plan remains appropriate    Co-evaluation                 AM-PAC OT "6 Clicks" Daily Activity     Outcome Measure   Help from another person eating meals?: None Help from another person taking care of personal grooming?: A Little Help from another person toileting, which includes using toliet, bedpan, or urinal?: A Lot Help from another person bathing (including washing, rinsing, drying)?: A Lot Help from another person to put on and taking off regular upper body clothing?: A Little Help from another person to put on and taking off regular lower body clothing?: A Lot 6 Click Score: 16    End of Session Equipment Utilized During Treatment: Gait belt;Rolling walker  OT Visit Diagnosis: Unsteadiness on feet (R26.81);Repeated falls (R29.6);History of falling (Z91.81)   Activity Tolerance Patient tolerated treatment well   Patient Left in bed;with call bell/phone within reach;with bed alarm set;with family/visitor present   Nurse Communication Mobility status        Time: 5974-1638 OT Time Calculation (min): 18 min  Charges: OT General Charges $OT Visit: 1 Visit OT Treatments $Therapeutic Activity: 8-22 mins  Lodema Hong, OTA   Trixie Dredge 10/06/2020, 3:44 PM

## 2020-10-06 NOTE — Progress Notes (Signed)
   Palliative Medicine Inpatient Follow Up Note     Chart Reviewed. Patient assessed at the bedside.   Patient sitting up in recliner resting comfortably. No acute distress noted.   Daughter is at the bedside. Updates and support provided. We reviewed goals of care. Family is taking things day by day. Request to continue with current plan of care as they await results from LP. Once results are final and they have received updates from Dr. Marin Olp. They would like to know what prognosis and all options are to assist in complex decision making. Support provided.   Discussed the importance of continued conversation with family and their  medical providers regarding overall plan of care and treatment options, ensuring decisions are within the context of the patients values and GOCs.   Questions addressed and support provided.    Objective Assessment: Vital Signs Vitals:   10/06/20 0734 10/06/20 1235  BP: 131/73 (!) 110/59  Pulse: 62 61  Resp: 18 18  Temp: 97.6 F (36.4 C) (!) 97.5 F (36.4 C)  SpO2:      Intake/Output Summary (Last 24 hours) at 10/06/2020 1454 Last data filed at 10/06/2020 1014 Gross per 24 hour  Intake 200 ml  Output 700 ml  Net -500 ml   Last Weight  Most recent update: 09/29/2020  4:43 PM    Weight  90 kg (198 lb 6.6 oz)            Gen:  NAD HEENT: moist mucous membranes CV: Regular rate and rhythm, no murmurs rubs or gallops PULM: normal breathing pattern  Neuro: Alert and oriented x3, mood appropriate   SUMMARY OF RECOMMENDATIONS   Continue with current plan of care per medical team. Family is awaiting final LP/cytology results and further discussions with Dr. Marin Olp to gain awareness of prognosis and treatment options. They are appropriately deferring any decisions until well informed.  PMT will continue to support and follow on as needed basis. Will follow chart peripherally to gain understanding of plans once final LP results are back. Will not  plan to see daily, however family aware we are available for support. Please secure chat for urgent needs.   Time Total: 25 min.   Visit consisted of counseling and education dealing with the complex and emotionally intense issues of symptom management and palliative care in the setting of serious and potentially life-threatening illness.Greater than 50%  of this time was spent counseling and coordinating care related to the above assessment and plan.  Alda Lea, AGPCNP-BC  Palliative Medicine Team [phone]  Palliative Medicine Team providers are available by phone from 7am to 7pm daily and can be reached through the team cell phone. Should this patient require assistance outside of these hours, please call the patient's attending physician.

## 2020-10-06 NOTE — Progress Notes (Signed)
Neurology Progress Note  S: Mr. Filler says he is feeling much better. Asking when he may go home. No n/v, HA, or weakness of a particular limb. His appetite is improved. States he is tired. PT is recommending home health PT vs SNF depending on progression and test results. Daughter at bedside.   LP was redone yesterday due to concern that cytology came back false negative.   O: Current vital signs: BP 131/73 (BP Location: Left Arm)   Pulse 62   Temp 97.6 F (36.4 C) (Oral)   Resp 18   Ht 5\' 10"  (1.778 m)   Wt 90 kg   SpO2 92%   BMI 28.47 kg/m  Vital signs in last 24 hours: Temp:  [97.5 F (36.4 C)-98.7 F (37.1 C)] 97.6 F (36.4 C) (09/30 0734) Pulse Rate:  [62-72] 62 (09/30 0734) Resp:  [17-18] 18 (09/30 0734) BP: (111-131)/(59-74) 131/73 (09/30 0734) SpO2:  [92 %-93 %] 92 % (09/29 1632)  GENERAL: Fairly well appearing elderly male lying in bed. Awake, alert in NAD. HEENT: Normocephalic and atraumatic. LUNGS: Normal respiratory effort.  Ext: warm.  NEURO:  Mental Status: Alert and oriented. Speech/Language: speech is without aphasia or dysarthria.  Naming, repetition, fluency, and comprehension intact.  Cranial Nerves:  PERRL.  Eyelids elevate symmetrically. HOH. Strength is 5/5 UEs and LEs. Smile is symmetrical. Tone is normal and bulk is normal. Sensation intact and symmetrical to UEs and LEs.  Medications  Current Facility-Administered Medications:    (feeding supplement) PROSource Plus liquid 30 mL, 30 mL, Oral, TID BM, Thurnell Lose, MD, 30 mL at 10/05/20 1519   acetaminophen (TYLENOL) tablet 650 mg, 650 mg, Oral, Q6H PRN, Tu, Ching T, DO, 650 mg at 10/03/20 0930   atorvastatin (LIPITOR) tablet 40 mg, 40 mg, Oral, Daily, Tu, Ching T, DO, 40 mg at 10/05/20 0839   cefTRIAXone (ROCEPHIN) 1 g in sodium chloride 0.9 % 100 mL IVPB, 1 g, Intravenous, Q24H, Candiss Norse, Prashant K, MD, Last Rate: 200 mL/hr at 10/05/20 1325, 1 g at 10/05/20 1325   Chlorhexidine Gluconate Cloth  2 % PADS 6 each, 6 each, Topical, Daily, Ghimire, Henreitta Leber, MD, 6 each at 10/05/20 9476   dexamethasone (DECADRON) injection 20 mg, 20 mg, Intravenous, Daily, Kirby-Graham, Karsten Fells, NP, 20 mg at 10/05/20 0847   diazepam (VALIUM) tablet 2 mg, 2 mg, Oral, BID, Lala Lund K, MD, 2 mg at 10/05/20 2009   escitalopram (LEXAPRO) tablet 10 mg, 10 mg, Oral, Daily, Tu, Ching T, DO, 10 mg at 10/05/20 5465   feeding supplement (BOOST / RESOURCE BREEZE) liquid 1 Container, 1 Container, Oral, TID BM, Thurnell Lose, MD, 1 Container at 10/05/20 2010   fentaNYL (SUBLIMAZE) injection 12.5 mcg, 12.5 mcg, Intravenous, Q6H PRN, Tu, Ching T, DO, 12.5 mcg at 03/54/65 6812   folic acid (FOLVITE) tablet 2 mg, 2 mg, Oral, Daily, Ennever, Rudell Cobb, MD, 2 mg at 10/05/20 0839   gabapentin (NEURONTIN) capsule 600 mg, 600 mg, Oral, BID, Tu, Ching T, DO, 600 mg at 10/05/20 2009   insulin aspart (novoLOG) injection 0-5 Units, 0-5 Units, Subcutaneous, QHS, Rai, Ripudeep K, MD, 3 Units at 10/05/20 2015   insulin aspart (novoLOG) injection 0-9 Units, 0-9 Units, Subcutaneous, TID WC, Rai, Ripudeep K, MD, 5 Units at 10/05/20 1708   labetalol (NORMODYNE) injection 5 mg, 5 mg, Intravenous, Q4H PRN, Tu, Ching T, DO   levothyroxine (SYNTHROID) tablet 25 mcg, 25 mcg, Oral, Q0600, Tu, Ching T, DO, 25 mcg at  10/06/20 0600   melatonin tablet 10 mg, 10 mg, Oral, QHS PRN, Shalhoub, Sherryll Burger, MD, 10 mg at 10/03/20 2356   metoCLOPramide (REGLAN) injection 5 mg, 5 mg, Intravenous, Q6H, Rai, Ripudeep K, MD, 5 mg at 10/06/20 0559   multivitamin with minerals tablet 1 tablet, 1 tablet, Oral, Daily, Tu, Ching T, DO, 1 tablet at 10/05/20 0838   ondansetron (ZOFRAN) injection 4 mg, 4 mg, Intravenous, Q8H, Thurnell Lose, MD, 4 mg at 10/06/20 0559   pantoprazole (PROTONIX) EC tablet 40 mg, 40 mg, Oral, Daily, Tu, Ching T, DO, 40 mg at 10/05/20 0300   polyethylene glycol (MIRALAX / GLYCOLAX) packet 17 g, 17 g, Oral, Daily PRN, Tu, Ching T, DO    pramipexole (MIRAPEX) tablet 3 mg, 3 mg, Oral, QHS, Tu, Ching T, DO, 3 mg at 10/05/20 2013   prochlorperazine (COMPAZINE) injection 5 mg, 5 mg, Intravenous, Q6H PRN, Timothy Lasso, MD, 5 mg at 10/03/20 1333   promethazine (PHENERGAN) 12.5 mg in sodium chloride 0.9 % 50 mL IVPB, 12.5 mg, Intravenous, Q8H PRN, Thurnell Lose, MD   thiamine tablet 100 mg, 100 mg, Oral, Daily, Lala Lund K, MD, 100 mg at 10/05/20 9233   vitamin B-12 (CYANOCOBALAMIN) tablet 1,000 mcg, 1,000 mcg, Oral, Daily, Tu, Ching T, DO, 1,000 mcg at 10/05/20 0076   warfarin (COUMADIN) tablet 7.5 mg, 7.5 mg, Oral, Once, Rozann Lesches, [facility]   Warfarin - Pharmacist Dosing Inpatient, , Does not apply, q1600, Thurnell Lose, MD, Given at 10/05/20 1712  Pertinent Labs INR 1.3.   No new Imaging  Assessment: 87 year old male with history of diffuse non-Hodgkin's lymphoma of the testes 2007 s/p radiation and chemotherapy. Relapse with chemo 3 years ago. MRI with findings of mass vs infection.  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 This CSF profile could conceivably be fungal meningitis as well, and therefore I have sent fungal cultures and stain.  Cytology was without evidence of malignant cells, however, due to concern for false negative cytology, r/p LP was performed yesterday. New LP shows elevated lymphocytes as prior one.   Impression: -Relapse of large cell non hodgkin's lymphoma with CNS symptoms and lesion on brain, infection vs. metastasis, but favor mets.   Recommendations/Plan:  -Await r/p LP results including fungal studies.  -Continue steroids per Oncology.   Pt seen by Clance Boll, MSN, APN-BC/Nurse Practitioner/Neuro and later by MD. Note and plan to be edited as needed by MD.  Pager: [phone]

## 2020-10-06 NOTE — Progress Notes (Signed)
PROGRESS NOTE        PATIENT DETAILS Name: Gary Yates. Age: 87 y.o. Sex: male Date of Birth: [birth year] Admit Date: 09/28/2020 Admitting Physician Orene Desanctis, DO [id number], Alinda Sierras, MD  Brief Narrative: Patient is a 87 y.o. 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  See below for further details.    9/23>>Urine Culture: Pending 9/22: CT abdomen/pelvis: No acute abnormality-prostatomegaly-no enlarged lymph nodes. 9/22: MRI brain-no contrast: T2 hyperintensity involving dorsal midbrain/pons/medulla 9/23>> MRI brain with contrast: Masslike enhancement of the cerebral aqueduct-nodular enhancement along the ventricle and multiple cranial nerves-suspicious for leptomeningeal spread of malignancy 9/27>> LP due  Subjective:  Patient in bed, appears comfortable, denies any headache, no fever, no chest pain or pressure, no shortness of breath , no abdominal pain. No new focal weakness.   Objective:  Vitals:  Blood pressure 131/73, pulse 62, temperature 97.6 F (36.4 C), temperature source Oral, resp. rate 18, height 5\' 10"  (1.778 m), weight 90 kg, SpO2 92 %.   Exam:  Awake Alert, No new F.N deficits, Normal affect, bilat.hearing loss [place].AT,PERRAL Supple Neck,No JVD, No cervical lymphadenopathy appriciated.  Symmetrical Chest wall movement, Good air movement bilaterally, CTAB RRR,No Gallops, Rubs or new Murmurs, No Parasternal Heave +ve B.Sounds, Abd Soft, No tenderness, No organomegaly appriciated, No rebound - guarding or rigidity. 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  Headache better-nausea vomiting still an issue will place him on scheduled Valium along with scheduled Zofran and as needed Phenergan and monitor. LP done  10/03/20 >> repeat 10/05/20, cytology negative from the first test, flow cytometry not done likely will be done on the9/29/2022 sample. Oncologist-Dr. Marin Olp following as well, will let oncology decide on course of action after cytology is back.  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  AKI on CKD stage IIIa: AKI likely hemodynamically mediated in the setting of nausea/vomiting-improved with supportive care.  PAF with underlying LBBB: Rate controlled-  resumed Coumadin.  Hypothyroidism: Continue Synthroid  Hypokalemia - replaced  HTN: BP controlled-monitor off antihypertensives.  Hyponatremia.  Appears dehydrated.  IV fluids on 10/04/2020.    UTI.  Empiric Rocephin x5 days.    DM-2 (A1c 6.3 on 7/27): CBG stable-on SSI.  Recent Labs    10/05/20 1634 10/05/20 1942 10/06/20 0736  GLUCAP 266* 270* 135*    Peripheral neuropathy: On Neurontin.  Nutrition Status: Nutrition Problem: Predicted suboptimal nutrient intake Etiology: nausea, vomiting Signs/Symptoms: per patient/family report Interventions: Ensure Enlive (each supplement provides 350kcal and 20 grams of protein), Magic cup, MVI  Estimated body mass index is 28.47 kg/m as calculated from the following:   Height as of this encounter: 5\' 10"  (1.778 m).   Weight as of this encounter: 90 kg.    Procedures: None Consults: Neurology, Onco DVT Prophylaxis: SCDs >>Coumadin Code Status:Full code  Family Communication: son bedside 10/01/20, daughter bedside on 10/02/2020, daughter [name] [phone] 10/04/20, son bedside 10/05/20  Time spent: 53 minutes-Greater than 50% of this time was spent in counseling, explanation  of diagnosis, planning of further  management, and coordination of care.  Diet: Diet Order             DIET SOFT Room service appropriate? Yes; Fluid consistency: Thin  Diet effective now                    Disposition Plan: Status is: Inpatient  Remains inpatient appropriate because:Inpatient level of care appropriate due to severity of illness  Dispo: The patient is from: Home              Anticipated d/c is to: Home              Patient currently is not medically stable to d/c.   Difficult to place patient No     Barriers to Discharge: Intractable headache/nausea/vomiting due to leptomeningeal involvement-lumbar puncture scheduled for this coming Monday.  Needs scheduled antiemetics to control symptoms.  Not yet stable for discharge.  Antimicrobial agents: Anti-infectives (From admission, onward)    Start     Dose/Rate Route Frequency Ordered Stop   10/04/20 1230  cefTRIAXone (ROCEPHIN) 1 g in sodium chloride 0.9 % 100 mL IVPB        1 g 200 mL/hr over 30 Minutes Intravenous Every 24 hours 10/04/20 1131 10/09/20 1229        MEDICATIONS: Scheduled Meds:  (feeding supplement) PROSource Plus  30 mL Oral TID BM   atorvastatin  40 mg Oral Daily   Chlorhexidine Gluconate Cloth  6 each Topical Daily   dexamethasone (DECADRON) injection  20 mg Intravenous Daily   diazepam  2 mg Oral BID   escitalopram  10 mg Oral Daily   feeding supplement  1 Container Oral TID BM   folic acid  2 mg Oral Daily   gabapentin  600 mg Oral BID   insulin aspart  0-5 Units Subcutaneous QHS   insulin aspart  0-9 Units Subcutaneous TID WC   levothyroxine  25 mcg Oral Q0600   metoCLOPramide (REGLAN) injection  5 mg Intravenous Q6H   multivitamin with minerals  1 tablet Oral Daily   ondansetron (ZOFRAN) IV  4 mg Intravenous Q8H   pantoprazole  40 mg Oral Daily   pramipexole  3 mg Oral QHS   thiamine  100 mg Oral Daily   vitamin B-12  1,000 mcg Oral Daily   warfarin  7.5 mg Oral Once   Warfarin - Pharmacist Dosing  Inpatient   Does not apply q1600   Continuous Infusions:  cefTRIAXone (ROCEPHIN)  IV 1 g (10/05/20 1325)   promethazine (PHENERGAN) injection (IM or IVPB)      PRN Meds:.acetaminophen, fentaNYL (SUBLIMAZE) injection, labetalol, melatonin, polyethylene glycol, prochlorperazine, promethazine (PHENERGAN) injection (IM or IVPB)   I have personally reviewed following labs and imaging studies  LABORATORY DATA:  Recent Labs  Lab 10/01/20 0058 10/02/20 0148 10/03/20 0255 10/04/20 0049 10/05/20 0302  WBC 7.1 6.1 5.5 4.8 7.0  HGB 11.1* 10.7* 10.5* 10.4* 9.9*  HCT 33.2* 32.1* 31.8* 31.9* 30.1*  PLT 125* 130* 125* 143* 140*  MCV 95.1 95.5 96.1 96.4 95.9  MCH 31.8 31.8 31.7 31.4 31.5  MCHC 33.4 33.3 33.0 32.6 32.9  RDW 15.6* 15.6* 15.1 14.7 14.9  LYMPHSABS  --  1.3 1.1 0.7 1.2  MONOABS  --  0.6 0.5 0.1 0.5  EOSABS  --  0.2 0.2 0.0 0.0  BASOSABS  --  0.1 0.1 0.0 0.0    Recent Labs  Lab 10/01/20 0058 10/02/20 0148  10/03/20 0255 10/04/20 0049 10/05/20 0302 10/06/20 0112  NA 136 133* 131* 129* 133*  --   K 4.0 3.7 3.4* 4.4 4.0  --   CL 102 97* 96* 97* 101  --   CO2 25 27 28 25 25   --   GLUCOSE 104* 120* 125* 217* 157*  --   BUN 18 21 21  28* 40*  --   CREATININE 1.73* 1.85* 1.81* 1.80* 1.78*  --   CALCIUM 8.8* 8.8* 8.8* 9.0 9.1  --   AST  --  14* 13* 14* 13*  --   ALT  --  6 6 7 6   --   ALKPHOS  --  70 76 75 68  --   BILITOT  --  0.8 1.1 0.9 0.7  --   ALBUMIN  --  3.0* 3.0* 2.9* 2.8*  --   MG  --  1.3* 1.8 2.0 1.8  --   INR 2.2* 1.6* 1.3* 1.2 1.2 1.3*  BNP 470.8* 420.3* 180.8* 318.8* 99.8  --          RADIOLOGY STUDIES/RESULTS: No results found.   LOS: 8 days   Signature  Lala Lund M.D on 10/06/2020 at 11:55 AM   -  To page go to [email or web address]

## 2020-10-06 NOTE — Progress Notes (Addendum)
Physical Therapy Treatment Patient Details Name: Gary Yates. MRN: [id number] DOB: 08-28-1933 Today's Date: 10/06/2020   History of Present Illness [name] who presents on 09/28/20 with several week hx of dizziness, headache, N/V. 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 MRI brain with contrast: Masslike enhancement of the cerebral aqueduct-nodular enhancement along the ventricle and multiple cranial nerves-suspicious for leptomeningeal spread of malignancy. s/p LP with cytology/flow cytometry on 9/27. PMH includes A-fib, COPD, CAD, non-Hodgkin's lymphoma in remission, HTN, OSA, DM.    PT Comments    Pt in bed on arrival and agreeable to participation in therapy. Pt's daughter present in room and confirms family's plan is for pt to d/c home. She reports he has good family support. PTA he has a PCA 3x week but family has submitted request for increase to 5x week. Wheelchair is the only needed DME. Pt required mod assist bed mobility, mod assist sit to stand, and mod assist SPT with RW. Mobility continues to be limited by dizziness. Pt in recliner with feet elevated at end of session.    Recommendations for follow up therapy are one component of a multi-disciplinary discharge planning process, led by the attending physician.  Recommendations may be updated based on patient status, additional functional criteria and insurance authorization.  Follow Up Recommendations  Home health PT;Supervision/Assistance - 24 hour     Equipment Recommendations  Wheelchair (measurements PT)    Recommendations for Other Services       Precautions / Restrictions Precautions Precautions: Fall;Other (comment) Precaution Comments: dizziness     Mobility  Bed Mobility Overal bed mobility: Needs Assistance Bed Mobility: Supine to Sit     Supine to sit: Mod assist;HOB  elevated     General bed mobility comments: +rail, increased time    Transfers Overall transfer level: Needs assistance Equipment used: Rolling walker (2 wheeled) Transfers: Sit to/from [facility] Sit to Stand: Mod assist;From elevated surface Stand pivot transfers: Mod assist       General transfer comment: cues for sequencing, increased time, assist to power up and stabilize balance during pivot. Pt sat impulsively due to dizziness.  Ambulation/Gait             General Gait Details: unable due to dizziness   Stairs Stairs:  (Pt has 2 steps to enter home. Verbally instructed daughter on ascend/descend steps in wheelchair. Daughter verbalizes understanding. She also reports being familiar with the technique due to her mother being in a w/c before passing away.)           Wheelchair Mobility    Modified Rankin (Stroke Patients Only)       Balance Overall balance assessment: History of Falls;Needs assistance Sitting-balance support: Feet supported;Single extremity supported Sitting balance-Leahy Scale: Fair     Standing balance support: Bilateral upper extremity supported;During functional activity Standing balance-Leahy Scale: Poor Standing balance comment: Reliant on UE and external support                            Cognition Arousal/Alertness: Awake/alert Behavior During Therapy: WFL for tasks assessed/performed Overall Cognitive Status: Within Functional Limits for tasks assessed                                 General Comments: very  HOH, hears best in R ear      Exercises      General Comments        Pertinent Vitals/Pain Pain Assessment: No/denies pain    Home Living                      Prior Function            PT Goals (current goals can now be found in the care plan section) Acute Rehab PT Goals Patient Stated Goal: to go home Progress towards PT goals: Progressing toward  goals    Frequency    Min 3X/week      PT Plan Equipment recommendations need to be updated    Co-evaluation              AM-PAC PT "6 Clicks" Mobility   Outcome Measure  Help needed turning from your back to your side while in a flat bed without using bedrails?: A Little Help needed moving from lying on your back to sitting on the side of a flat bed without using bedrails?: A Lot Help needed moving to and from a bed to a chair (including a wheelchair)?: A Lot Help needed standing up from a chair using your arms (e.g., wheelchair or bedside chair)?: A Lot Help needed to walk in hospital room?: A Lot Help needed climbing 3-5 steps with a railing? : Total 6 Click Score: 12    End of Session Equipment Utilized During Treatment: Gait belt Activity Tolerance: Treatment limited secondary to medical complications (Comment) (dizziness) Patient left: in chair;with call bell/phone within reach;with chair alarm set Nurse Communication: Mobility status PT Visit Diagnosis: Unsteadiness on feet (R26.81);Dizziness and giddiness (R42);Difficulty in walking, not elsewhere classified (R26.2);History of falling (Z91.81)     Time: 0947-0962 PT Time Calculation (min) (ACUTE ONLY): 18 min  Charges:  $Therapeutic Activity: 8-22 mins                     Lorrin Goodell, PT  Office # [phone] Pager [phone]    Lorriane Shire 10/06/2020, 9:32 AM

## 2020-10-06 NOTE — Progress Notes (Signed)
Gary Yates had another spinal tap done.  We will have to see with the flow cytometry shows.  The spinal fluid did show pretty much all lymphocytes.  He is eating better.  I am sure the steroids are helping quite a bit.  He is not having any problems with pain.  He still is hard of hearing.  He has had no complaint of headache.  His blood sugars are on the high side.  However, given the "big picture", I really do not think this is much of a problem.  He has not had a bowel movement since his been in the hospital.  I really would have to think that he could go home.  He has a lot of support from his family.  Nothing is going to happen over the weekend for him.  I am not sure we will get the flow cytometry back today.  Even if we did, I would not imagine that there is can be anything done.  I would much prefer him to be home.  Again he has a lot of support from his family to be able to help him.  His vital signs are temperature 98.7.  Pulse 72.  Blood pressure 119/74.  There is really no change in his overall physical exam.  Again, I do not believe that we are dealing with CNS relapse of his lymphoma.  Testicular lymphoma for some reason, has a higher incidence of CNS relapse than the typical lymphoma.  If Radiation Oncology does not feel comfortable giving him radiation, we could try systemic therapy with Rituxan and Imbruvica.  This has CNS penetration.  Hopefully, he will be able to go home.  I know he had fantastic care from all staff on 5 W.  Lattie Haw, MD  Romans 8:28

## 2020-10-06 NOTE — Progress Notes (Signed)
ANTICOAGULATION CONSULT NOTE - Follow Up Consult  Pharmacy Consult for Warfarin Indication: atrial fibrillation  Allergies  Allergen Reactions   Latex Other (See Comments)    Reddens the skin   Ace Inhibitors Cough   Codeine Nausea Only and Rash        Compazine [Prochlorperazine] Anxiety   Penicillins Rash    Childhood allergy Has patient had a PCN reaction causing immediate rash, facial/tongue/throat swelling, SOB or lightheadedness with hypotension: Yes Has patient had a PCN reaction causing severe rash involving mucus membranes or skin necrosis: Yes Has patient had a PCN reaction that required hospitalization No Has patient had a PCN reaction occurring within the last 10 years: No If all of the above answers are "NO", then may proceed with Cephalosporin use.     Labs: Recent Labs    10/04/20 0049 10/05/20 0302 10/06/20 0112  HGB 10.4* 9.9*  --   HCT 31.9* 30.1*  --   PLT 143* 140*  --   LABPROT 15.0 14.7 15.8*  INR 1.2 1.2 1.3*  CREATININE 1.80* 1.78*  --      Estimated Creatinine Clearance: 34.2 mL/min (A) (by C-G formula based on SCr of 1.78 mg/dL (H)).  Assessment: Resuming prior to admission warfarin for PAF after LP x 2, INR 1.3 today.  Dose prior to admission 5 mg Monday and Thursday, 2.5 mg all other days  Goal of Therapy:  INR 2-3 Monitor platelets by anticoagulation protocol: Yes   Plan:  Warfarin 7.5 mg po x 1 Daily INR  Thank you Anette Guarneri, PharmD 10/06/2020,8:14 AM

## 2020-10-07 DIAGNOSIS — R112 Nausea with vomiting, unspecified: Secondary | ICD-10-CM | POA: Diagnosis not present

## 2020-10-07 LAB — CBC WITH DIFFERENTIAL/PLATELET
Abs Immature Granulocytes: 0.02 10*3/uL (ref 0.00–0.07)
Basophils Absolute: 0 10*3/uL (ref 0.0–0.1)
Basophils Relative: 0 %
Eosinophils Absolute: 0 10*3/uL (ref 0.0–0.5)
Eosinophils Relative: 0 %
HCT: 28.7 % — ABNORMAL LOW (ref 39.0–52.0)
Hemoglobin: 9.6 g/dL — ABNORMAL LOW (ref 13.0–17.0)
Immature Granulocytes: 1 %
Lymphocytes Relative: 15 %
Lymphs Abs: 0.7 10*3/uL (ref 0.7–4.0)
MCH: 31.6 pg (ref 26.0–34.0)
MCHC: 33.4 g/dL (ref 30.0–36.0)
MCV: 94.4 fL (ref 80.0–100.0)
Monocytes Absolute: 0.2 10*3/uL (ref 0.1–1.0)
Monocytes Relative: 5 %
Neutro Abs: 3.5 10*3/uL (ref 1.7–7.7)
Neutrophils Relative %: 79 %
Platelets: 140 10*3/uL — ABNORMAL LOW (ref 150–400)
RBC: 3.04 MIL/uL — ABNORMAL LOW (ref 4.22–5.81)
RDW: 14.4 % (ref 11.5–15.5)
WBC: 4.4 10*3/uL (ref 4.0–10.5)
nRBC: 0 % (ref 0.0–0.2)

## 2020-10-07 LAB — PROTIME-INR
INR: 1.3 — ABNORMAL HIGH (ref 0.8–1.2)
Prothrombin Time: 16.4 seconds — ABNORMAL HIGH (ref 11.4–15.2)

## 2020-10-07 LAB — COMPREHENSIVE METABOLIC PANEL
ALT: 13 U/L (ref 0–44)
AST: 22 U/L (ref 15–41)
Albumin: 2.6 g/dL — ABNORMAL LOW (ref 3.5–5.0)
Alkaline Phosphatase: 76 U/L (ref 38–126)
Anion gap: 8 (ref 5–15)
BUN: 43 mg/dL — ABNORMAL HIGH (ref 8–23)
CO2: 23 mmol/L (ref 22–32)
Calcium: 8.4 mg/dL — ABNORMAL LOW (ref 8.9–10.3)
Chloride: 98 mmol/L (ref 98–111)
Creatinine, Ser: 1.64 mg/dL — ABNORMAL HIGH (ref 0.61–1.24)
GFR, Estimated: 41 mL/min — ABNORMAL LOW (ref >60)
Glucose, Bld: 263 mg/dL — ABNORMAL HIGH (ref 70–99)
Potassium: 3.4 mmol/L — ABNORMAL LOW (ref 3.5–5.1)
Sodium: 129 mmol/L — ABNORMAL LOW (ref 135–145)
Total Bilirubin: 0.3 mg/dL (ref 0.3–1.2)
Total Protein: 5.2 g/dL — ABNORMAL LOW (ref 6.5–8.1)

## 2020-10-07 LAB — GLUCOSE, CAPILLARY
Glucose-Capillary: 151 mg/dL — ABNORMAL HIGH (ref 70–99)
Glucose-Capillary: 154 mg/dL — ABNORMAL HIGH (ref 70–99)
Glucose-Capillary: 208 mg/dL — ABNORMAL HIGH (ref 70–99)
Glucose-Capillary: 219 mg/dL — ABNORMAL HIGH (ref 70–99)

## 2020-10-07 LAB — MAGNESIUM: Magnesium: 1.6 mg/dL — ABNORMAL LOW (ref 1.7–2.4)

## 2020-10-07 MED ORDER — POTASSIUM CHLORIDE CRYS ER 20 MEQ PO TBCR
40.0000 meq | EXTENDED_RELEASE_TABLET | Freq: Once | ORAL | Status: AC
  Administered 2020-10-07: 40 meq via ORAL
  Filled 2020-10-07: qty 2

## 2020-10-07 MED ORDER — MAGNESIUM SULFATE 2 GM/50ML IV SOLN
2.0000 g | Freq: Once | INTRAVENOUS | Status: AC
  Administered 2020-10-07: 2 g via INTRAVENOUS
  Filled 2020-10-07: qty 50

## 2020-10-07 MED ORDER — WARFARIN SODIUM 5 MG PO TABS
5.0000 mg | ORAL_TABLET | Freq: Once | ORAL | Status: AC
  Administered 2020-10-07: 5 mg via ORAL
  Filled 2020-10-07: qty 1

## 2020-10-07 MED ORDER — LACTATED RINGERS IV SOLN: INTRAVENOUS | Status: AC

## 2020-10-07 NOTE — Progress Notes (Signed)
PROGRESS NOTE        PATIENT DETAILS Name: Gary Yates. Age: 87 y.o. Sex: male Date of Birth: 12/11/1933 Admit Date: 09/28/2020 Admitting Physician Orene Desanctis, DO LKG:[id number], Alinda Sierras, MD  Brief Narrative: Patient is a 87 y.o. 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  See below for further details.    9/23>>Urine Culture: Pending 9/22: CT abdomen/pelvis: No acute abnormality-prostatomegaly-no enlarged lymph nodes. 9/22: MRI brain-no contrast: T2 hyperintensity involving dorsal midbrain/pons/medulla 9/23>> MRI brain with contrast: Masslike enhancement of the cerebral aqueduct-nodular enhancement along the ventricle and multiple cranial nerves-suspicious for leptomeningeal spread of malignancy 9/29>> LP CSF - Numerous lymphoid cells are present, with some atypical forms and an occasional blast.  These findings are suggestive of lymphoma, consistent with the clinical diagnosis.  Subjective:   Patient in bed, appears comfortable, denies any headache, no fever, no chest pain or pressure, no shortness of breath , no abdominal pain. No new focal weakness.    Objective:  Vitals:  Blood pressure 130/79, pulse 82, temperature 98.3 F (36.8 C), temperature source Oral, resp. rate 16, height 5\' 10"  (1.778 m), weight 90.6 kg, SpO2 98 %.   Exam:  Awake Alert, No new F.N deficits, Normal affect [place].AT,PERRAL Supple Neck,No JVD, No cervical lymphadenopathy appriciated.  Symmetrical Chest wall movement, Good air movement bilaterally, CTAB RRR,No Gallops, Rubs or new Murmurs, No Parasternal Heave +ve B.Sounds, Abd Soft, No tenderness, No organomegaly appriciated, No rebound - guarding or rigidity. No  Cyanosis, Clubbing or edema, No new Rash or bruise    Assessment/Plan:  Headache with intractable nausea/vomiting/diplopia/nystagmus: Likely due to leptomeningeal involvement of CNS from underlying malignancy.  Headache better-nausea vomiting still an issue will place him on scheduled Valium along with scheduled Zofran and as needed Phenergan and monitor.  CSF from LP done on 10/05/2020 consistent with malignant cells, cytology pending, prognosis is extremely poor. Oncologist-Dr. Marin Olp following as well, will let oncology decide on course of action likely coming Monday.  CSF - Numerous lymphoid cells are present, with some atypical forms and an occasional blast.  These findings are suggestive of lymphoma, consistent with the clinical diagnosis.   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  AKI on CKD stage IIIa: AKI likely hemodynamically mediated in the setting of nausea/vomiting-improved with supportive care.  PAF with underlying LBBB: Rate controlled-  resumed Coumadin.  Hypothyroidism: Continue Synthroid  Hypokalemia, Hypomagnesemia - replaced  HTN: BP controlled-monitor off antihypertensives.  Hyponatremia.  Appears dehydrated.  IV fluids on 10/07/2020.    UTI.  Empiric Rocephin x5 days.    DM-2 (A1c 6.3 on 7/27): CBG stable-on SSI.  Recent Labs    10/06/20 1710 10/06/20 2058 10/07/20 0757  GLUCAP 318* 247* 151*    Peripheral neuropathy: On Neurontin.  Nutrition Status: Nutrition Problem: Predicted suboptimal nutrient intake Etiology: nausea, vomiting Signs/Symptoms: per patient/family report Interventions: Ensure Enlive (each supplement provides 350kcal and 20 grams of protein), Magic cup, MVI  Estimated body mass index is 28.66 kg/m as calculated from the following:   Height as of this encounter: 5\' 10"  (1.778 m).   Weight as of this encounter: 90.6 kg.  Procedures: None Consults: Neurology,  Onco DVT Prophylaxis: SCDs >>Coumadin Code Status:Full code  Family Communication: son bedside 10/01/20, daughter bedside on 10/02/2020, daughter [name] [phone] 10/04/20, son bedside 10/05/20  Time spent: 11 minutes-Greater than 50% of this time was spent in counseling, explanation of diagnosis, planning of further management, and coordination of care.  Diet: Diet Order             DIET SOFT Room service appropriate? Yes; Fluid consistency: Thin  Diet effective now                    Disposition Plan: Status is: Inpatient  Remains inpatient appropriate because:Inpatient level of care appropriate due to severity of illness  Dispo: The patient is from: Home              Anticipated d/c is to: Home              Patient currently is not medically stable to d/c.   Difficult to place patient No     Barriers to Discharge: Intractable headache/nausea/vomiting due to leptomeningeal involvement-lumbar puncture scheduled for this coming Monday.  Needs scheduled antiemetics to control symptoms.  Not yet stable for discharge.  Antimicrobial agents: Anti-infectives (From admission, onward)    Start     Dose/Rate Route Frequency Ordered Stop   10/04/20 1230  cefTRIAXone (ROCEPHIN) 1 g in sodium chloride 0.9 % 100 mL IVPB        1 g 200 mL/hr over 30 Minutes Intravenous Every 24 hours 10/04/20 1131 10/09/20 1229        MEDICATIONS: Scheduled Meds:  (feeding supplement) PROSource Plus  30 mL Oral TID BM   atorvastatin  40 mg Oral Daily   Chlorhexidine Gluconate Cloth  6 each Topical Daily   dexamethasone (DECADRON) injection  20 mg Intravenous Daily   diazepam  2 mg Oral BID   escitalopram  10 mg Oral Daily   feeding supplement  1 Container Oral TID BM   folic acid  2 mg Oral Daily   gabapentin  600 mg Oral BID   insulin aspart  0-5 Units Subcutaneous QHS   insulin aspart  0-9 Units Subcutaneous TID WC   levothyroxine  25 mcg Oral Q0600   metoCLOPramide (REGLAN) injection   5 mg Intravenous Q6H   multivitamin with minerals  1 tablet Oral Daily   ondansetron (ZOFRAN) IV  4 mg Intravenous Q8H   pantoprazole  40 mg Oral Daily   pramipexole  3 mg Oral QHS   thiamine  100 mg Oral Daily   vitamin B-12  1,000 mcg Oral Daily   warfarin  5 mg Oral ONCE-1600   Warfarin - Pharmacist Dosing Inpatient   Does not apply q1600   Continuous Infusions:  cefTRIAXone (ROCEPHIN)  IV 1 g (10/06/20 1316)   lactated ringers 100 mL/hr at 10/07/20 0942   magnesium sulfate bolus IVPB 2 g (10/07/20 0939)   promethazine (PHENERGAN) injection (IM or IVPB)      PRN Meds:.acetaminophen, fentaNYL (SUBLIMAZE) injection, labetalol, melatonin, polyethylene glycol, prochlorperazine, promethazine (PHENERGAN) injection (IM or IVPB)   I have personally reviewed following labs and imaging studies  LABORATORY DATA:  Recent Labs  Lab 10/02/20 0148 10/03/20 0255 10/04/20 0049 10/05/20 0302 10/07/20 0047  WBC 6.1 5.5 4.8 7.0 4.4  HGB 10.7* 10.5* 10.4* 9.9* 9.6*  HCT 32.1* 31.8* 31.9* 30.1* 28.7*  PLT 130* 125* 143* 140* 140*  MCV 95.5 96.1 96.4 95.9 94.4  MCH 31.8 31.7 31.4  31.5 31.6  MCHC 33.3 33.0 32.6 32.9 33.4  RDW 15.6* 15.1 14.7 14.9 14.4  LYMPHSABS 1.3 1.1 0.7 1.2 0.7  MONOABS 0.6 0.5 0.1 0.5 0.2  EOSABS 0.2 0.2 0.0 0.0 0.0  BASOSABS 0.1 0.1 0.0 0.0 0.0    Recent Labs  Lab 10/01/20 0058 10/02/20 0148 10/03/20 0255 10/04/20 0049 10/05/20 0302 10/06/20 0112 10/07/20 0047  NA 136 133* 131* 129* 133*  --  129*  K 4.0 3.7 3.4* 4.4 4.0  --  3.4*  CL 102 97* 96* 97* 101  --  98  CO2 25 27 28 25 25   --  23  GLUCOSE 104* 120* 125* 217* 157*  --  263*  BUN 18 21 21  28* 40*  --  43*  CREATININE 1.73* 1.85* 1.81* 1.80* 1.78*  --  1.64*  CALCIUM 8.8* 8.8* 8.8* 9.0 9.1  --  8.4*  AST  --  14* 13* 14* 13*  --  22  ALT  --  6 6 7 6   --  13  ALKPHOS  --  70 76 75 68  --  76  BILITOT  --  0.8 1.1 0.9 0.7  --  0.3  ALBUMIN  --  3.0* 3.0* 2.9* 2.8*  --  2.6*  MG  --  1.3* 1.8  2.0 1.8  --  1.6*  INR 2.2* 1.6* 1.3* 1.2 1.2 1.3* 1.3*  BNP 470.8* 420.3* 180.8* 318.8* 99.8  --   --        RADIOLOGY STUDIES/RESULTS: No results found.   LOS: 9 days   Signature  Lala Lund M.D on 10/07/2020 at 10:16 AM   -  To page go to [email or web address]

## 2020-10-07 NOTE — Progress Notes (Signed)
ANTICOAGULATION CONSULT NOTE - Follow Up Consult  Pharmacy Consult for Warfarin Indication: atrial fibrillation  Allergies  Allergen Reactions   Latex Other (See Comments)    Reddens the skin   Ace Inhibitors Cough   Codeine Nausea Only and Rash        Compazine [Prochlorperazine] Anxiety   Penicillins Rash    Childhood allergy Has patient had a PCN reaction causing immediate rash, facial/tongue/throat swelling, SOB or lightheadedness with hypotension: Yes Has patient had a PCN reaction causing severe rash involving mucus membranes or skin necrosis: Yes Has patient had a PCN reaction that required hospitalization No Has patient had a PCN reaction occurring within the last 10 years: No If all of the above answers are "NO", then may proceed with Cephalosporin use.     Patient Measurements: Height: 5\' 10"  (177.8 cm) Weight: 90.6 kg (199 lb 11.8 oz) IBW/kg (Calculated) : 73  Vital Signs: Temp: 98.3 F (36.8 C) (10/01 0757) Temp Source: Oral (10/01 0757) BP: 130/79 (10/01 0757) Pulse Rate: 82 (10/01 0757)  Labs: Recent Labs    10/05/20 0302 10/06/20 0112 10/07/20 0047  HGB 9.9*  --  9.6*  HCT 30.1*  --  28.7*  PLT 140*  --  140*  LABPROT 14.7 15.8* 16.4*  INR 1.2 1.3* 1.3*  CREATININE 1.78*  --  1.64*    Estimated Creatinine Clearance: 37.3 mL/min (A) (by C-G formula based on SCr of 1.64 mg/dL (H)).   Medications:  Scheduled:   (feeding supplement) PROSource Plus  30 mL Oral TID BM   atorvastatin  40 mg Oral Daily   Chlorhexidine Gluconate Cloth  6 each Topical Daily   dexamethasone (DECADRON) injection  20 mg Intravenous Daily   diazepam  2 mg Oral BID   escitalopram  10 mg Oral Daily   feeding supplement  1 Container Oral TID BM   folic acid  2 mg Oral Daily   gabapentin  600 mg Oral BID   insulin aspart  0-5 Units Subcutaneous QHS   insulin aspart  0-9 Units Subcutaneous TID WC   levothyroxine  25 mcg Oral Q0600   metoCLOPramide (REGLAN) injection  5  mg Intravenous Q6H   multivitamin with minerals  1 tablet Oral Daily   ondansetron (ZOFRAN) IV  4 mg Intravenous Q8H   pantoprazole  40 mg Oral Daily   potassium chloride  40 mEq Oral Once   pramipexole  3 mg Oral QHS   thiamine  100 mg Oral Daily   vitamin B-12  1,000 mcg Oral Daily   Warfarin - Pharmacist Dosing Inpatient   Does not apply q1600   Infusions:   cefTRIAXone (ROCEPHIN)  IV 1 g (10/06/20 1316)   lactated ringers     magnesium sulfate bolus IVPB     promethazine (PHENERGAN) injection (IM or IVPB)      Assessment: Patient was on warfarin PTA with 5 mg on Monday and Thursday, and 2.5 mg all other days for pAF. He received an LP twice and warfarin was resumed. He was given 5 mg, 5 mg, and 7.5 mg with INRs of 1.2 and 1.3 on 10/1. Since patient was not taking warfarin, low INR is expected. Give one more day of 5 mg, then consider restarting PTA dosing as he will be on day 5 of restarting warfarin.  Goal of Therapy:  INR 2-3 Monitor platelets by anticoagulation protocol: Yes   Plan:  Warfarin 5 mg x1 Daily INR Monitor for signs or symptoms of bleeding  Varney Daily, PharmD PGY1 Pharmacy Resident  Please check AMION for all [facility] pharmacy phone numbers After 10:00 PM call main pharmacy [phone]

## 2020-10-08 DIAGNOSIS — R112 Nausea with vomiting, unspecified: Secondary | ICD-10-CM | POA: Diagnosis not present

## 2020-10-08 LAB — COMPREHENSIVE METABOLIC PANEL
ALT: 15 U/L (ref 0–44)
AST: 25 U/L (ref 15–41)
Albumin: 2.7 g/dL — ABNORMAL LOW (ref 3.5–5.0)
Alkaline Phosphatase: 66 U/L (ref 38–126)
Anion gap: 10 (ref 5–15)
BUN: 52 mg/dL — ABNORMAL HIGH (ref 8–23)
CO2: 22 mmol/L (ref 22–32)
Calcium: 8.6 mg/dL — ABNORMAL LOW (ref 8.9–10.3)
Chloride: 99 mmol/L (ref 98–111)
Creatinine, Ser: 1.57 mg/dL — ABNORMAL HIGH (ref 0.61–1.24)
GFR, Estimated: 43 mL/min — ABNORMAL LOW (ref >60)
Glucose, Bld: 261 mg/dL — ABNORMAL HIGH (ref 70–99)
Potassium: 4.1 mmol/L (ref 3.5–5.1)
Sodium: 131 mmol/L — ABNORMAL LOW (ref 135–145)
Total Bilirubin: 0.4 mg/dL (ref 0.3–1.2)
Total Protein: 5.3 g/dL — ABNORMAL LOW (ref 6.5–8.1)

## 2020-10-08 LAB — CBC WITH DIFFERENTIAL/PLATELET
Abs Immature Granulocytes: 0.05 10*3/uL (ref 0.00–0.07)
Basophils Absolute: 0 10*3/uL (ref 0.0–0.1)
Basophils Relative: 0 %
Eosinophils Absolute: 0 10*3/uL (ref 0.0–0.5)
Eosinophils Relative: 0 %
HCT: 31.1 % — ABNORMAL LOW (ref 39.0–52.0)
Hemoglobin: 10.2 g/dL — ABNORMAL LOW (ref 13.0–17.0)
Immature Granulocytes: 1 %
Lymphocytes Relative: 13 %
Lymphs Abs: 0.7 10*3/uL (ref 0.7–4.0)
MCH: 31.5 pg (ref 26.0–34.0)
MCHC: 32.8 g/dL (ref 30.0–36.0)
MCV: 96 fL (ref 80.0–100.0)
Monocytes Absolute: 0.3 10*3/uL (ref 0.1–1.0)
Monocytes Relative: 5 %
Neutro Abs: 4.5 10*3/uL (ref 1.7–7.7)
Neutrophils Relative %: 81 %
Platelets: 159 10*3/uL (ref 150–400)
RBC: 3.24 MIL/uL — ABNORMAL LOW (ref 4.22–5.81)
RDW: 14.5 % (ref 11.5–15.5)
WBC: 5.6 10*3/uL (ref 4.0–10.5)
nRBC: 0 % (ref 0.0–0.2)

## 2020-10-08 LAB — GLUCOSE, CAPILLARY
Glucose-Capillary: 221 mg/dL — ABNORMAL HIGH (ref 70–99)
Glucose-Capillary: 191 mg/dL — ABNORMAL HIGH (ref 70–99)
Glucose-Capillary: 261 mg/dL — ABNORMAL HIGH (ref 70–99)
Glucose-Capillary: 216 mg/dL — ABNORMAL HIGH (ref 70–99)

## 2020-10-08 LAB — PROTIME-INR
INR: 1.8 — ABNORMAL HIGH (ref 0.8–1.2)
Prothrombin Time: 20.7 seconds — ABNORMAL HIGH (ref 11.4–15.2)

## 2020-10-08 LAB — MAGNESIUM: Magnesium: 1.7 mg/dL (ref 1.7–2.4)

## 2020-10-08 MED ORDER — LACTATED RINGERS IV SOLN: INTRAVENOUS | Status: AC

## 2020-10-08 MED ORDER — WARFARIN SODIUM 2.5 MG PO TABS
2.5000 mg | ORAL_TABLET | Freq: Once | ORAL | Status: AC
  Administered 2020-10-08: 2.5 mg via ORAL
  Filled 2020-10-08: qty 1

## 2020-10-08 MED ORDER — DEXAMETHASONE SODIUM PHOSPHATE 10 MG/ML IJ SOLN
12.0000 mg | Freq: Every day | INTRAMUSCULAR | Status: DC
  Administered 2020-10-08: 12 mg via INTRAVENOUS
  Filled 2020-10-08: qty 2

## 2020-10-08 MED ORDER — MAGNESIUM SULFATE 2 GM/50ML IV SOLN
2.0000 g | Freq: Once | INTRAVENOUS | Status: AC
  Administered 2020-10-08: 2 g via INTRAVENOUS
  Filled 2020-10-08: qty 50

## 2020-10-08 NOTE — Progress Notes (Signed)
ANTICOAGULATION CONSULT NOTE - Follow Up Consult  Pharmacy Consult for Warfarin Indication: atrial fibrillation  Allergies  Allergen Reactions   Latex Other (See Comments)    Reddens the skin   Ace Inhibitors Cough   Codeine Nausea Only and Rash        Compazine [Prochlorperazine] Anxiety   Penicillins Rash    Childhood allergy Has patient had a PCN reaction causing immediate rash, facial/tongue/throat swelling, SOB or lightheadedness with hypotension: Yes Has patient had a PCN reaction causing severe rash involving mucus membranes or skin necrosis: Yes Has patient had a PCN reaction that required hospitalization No Has patient had a PCN reaction occurring within the last 10 years: No If all of the above answers are "NO", then may proceed with Cephalosporin use.     Patient Measurements: Height: 5\' 10"  (177.8 cm) Weight: 90.6 kg (199 lb 11.8 oz) IBW/kg (Calculated) : 73  Vital Signs: Temp: 97.9 F (36.6 C) (10/02 0826) Temp Source: Oral (10/02 0826) BP: 112/66 (10/02 0826) Pulse Rate: 66 (10/02 0826)  Labs: Recent Labs    10/06/20 0112 10/07/20 0047 10/08/20 0145  HGB  --  9.6* 10.2*  HCT  --  28.7* 31.1*  PLT  --  140* 159  LABPROT 15.8* 16.4* 20.7*  INR 1.3* 1.3* 1.8*  CREATININE  --  1.64* 1.57*     Estimated Creatinine Clearance: 38.9 mL/min (A) (by C-G formula based on SCr of 1.57 mg/dL (H)).   Medications:  Scheduled:   (feeding supplement) PROSource Plus  30 mL Oral TID BM   atorvastatin  40 mg Oral Daily   Chlorhexidine Gluconate Cloth  6 each Topical Daily   dexamethasone (DECADRON) injection  12 mg Intravenous Daily   diazepam  2 mg Oral BID   escitalopram  10 mg Oral Daily   feeding supplement  1 Container Oral TID BM   folic acid  2 mg Oral Daily   gabapentin  600 mg Oral BID   insulin aspart  0-5 Units Subcutaneous QHS   insulin aspart  0-9 Units Subcutaneous TID WC   levothyroxine  25 mcg Oral Q0600   metoCLOPramide (REGLAN) injection   5 mg Intravenous Q6H   multivitamin with minerals  1 tablet Oral Daily   ondansetron (ZOFRAN) IV  4 mg Intravenous Q8H   pantoprazole  40 mg Oral Daily   pramipexole  3 mg Oral QHS   thiamine  100 mg Oral Daily   vitamin B-12  1,000 mcg Oral Daily   Warfarin - Pharmacist Dosing Inpatient   Does not apply q1600   Infusions:   cefTRIAXone (ROCEPHIN)  IV 1 g (10/07/20 1351)   lactated ringers 75 mL/hr at 10/08/20 0749   magnesium sulfate bolus IVPB     promethazine (PHENERGAN) injection (IM or IVPB)      Assessment: Patient was on warfarin PTA with 5 mg on Monday and Thursday, and 2.5 mg all other days for pAF. He received an LP twice and warfarin was resumed. He was given 5 mg, 5 mg, and 7.5 mg with INRs of 1.2, 1.3, and 1.8 on 10/2. Since patient was not taking warfarin, low INR is expected. Since he is on day 5 of restarting warfarin, PTA dosing will be resumed for today.  Goal of Therapy:  INR 2-3 Monitor platelets by anticoagulation protocol: Yes   Plan:  Warfarin 2.5 mg x1 Daily INR Monitor for signs or symptoms of bleeding   Varney Daily, PharmD PGY1 Pharmacy Resident  Please  check AMION for all [facility] pharmacy phone numbers After 10:00 PM call main pharmacy [phone]

## 2020-10-08 NOTE — Progress Notes (Signed)
PROGRESS NOTE        PATIENT DETAILS Name: Gary Yates. Age: 87 y.o. Sex: male Date of Birth: 14-Jan-1933 Admit Date: 09/28/2020 Admitting Physician Orene Desanctis, DO OXB:[id number], Alinda Sierras, MD  Brief Narrative: Patient is a 87 y.o. 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  See below for further details.    9/23>>Urine Culture: Pending 9/22: CT abdomen/pelvis: No acute abnormality-prostatomegaly-no enlarged lymph nodes. 9/22: MRI brain-no contrast: T2 hyperintensity involving dorsal midbrain/pons/medulla 9/23>> MRI brain with contrast: Masslike enhancement of the cerebral aqueduct-nodular enhancement along the ventricle and multiple cranial nerves-suspicious for leptomeningeal spread of malignancy 9/29>> LP CSF - Numerous lymphoid cells are present, with some atypical forms and an occasional blast.  These findings are suggestive of lymphoma, consistent with the clinical diagnosis.  Subjective:   Patient in bed, appears comfortable, denies any headache, no fever, no chest pain or pressure, no shortness of breath , no abdominal pain. No new focal weakness.    Objective:  Vitals:  Blood pressure 112/66, pulse 66, temperature 97.9 F (36.6 C), temperature source Oral, resp. rate 16, height 5\' 10"  (1.778 m), weight 90.6 kg, SpO2 94 %.   Exam:  Awake Alert, No new F.N deficits, Normal affect [place].AT,PERRAL Supple Neck,No JVD, No cervical lymphadenopathy appriciated.  Symmetrical Chest wall movement, Good air movement bilaterally, CTAB RRR,No Gallops, Rubs or new Murmurs, No Parasternal Heave +ve B.Sounds, Abd Soft, No tenderness, No organomegaly appriciated, No rebound - guarding or rigidity. No  Cyanosis, Clubbing or edema, No new Rash or bruise    Assessment/Plan:  Headache with intractable nausea/vomiting/diplopia/nystagmus: Likely due to leptomeningeal involvement of CNS from underlying malignancy.  Headache better-nausea vomiting still an issue will place him on scheduled Valium along with scheduled Zofran and as needed Phenergan and monitor.  CSF from LP done on 10/05/2020 consistent with malignant cells, cytology pending, prognosis is extremely poor, symptoms better on Decadron 20 daily, will gently taper the dose. Oncologist-Dr. Marin Olp following as well, will let oncology decide on course of action likely coming Monday.  CSF - Numerous lymphoid cells are present, with some atypical forms and an occasional blast.  These findings are suggestive of lymphoma, consistent with the clinical diagnosis.    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  AKI on CKD stage IIIa: AKI likely hemodynamically mediated in the setting of nausea/vomiting-improved with supportive care.  PAF with underlying LBBB: Rate controlled-  resumed Coumadin.  Hypothyroidism: Continue Synthroid  Hypokalemia, Hypomagnesemia - replaced  HTN: BP controlled-monitor off antihypertensives.  Hyponatremia.  Appears dehydrated.  IV fluids on 10/07/2020.    UTI.  Empiric Rocephin x5 days.    DM-2 (A1c 6.3 on 7/27): CBG stable-on SSI.  Recent Labs    10/07/20 1603 10/07/20 2049 10/08/20 0829  GLUCAP 208* 219* 221*    Peripheral neuropathy: On Neurontin.  Nutrition Status: Nutrition Problem: Predicted suboptimal nutrient intake Etiology: nausea, vomiting Signs/Symptoms: per patient/family report Interventions: Ensure Enlive (each supplement provides 350kcal and 20 grams of protein), Magic cup, MVI  Estimated body mass index is 28.66 kg/m as calculated from the following:   Height as of this encounter: 5'  10" (1.778 m).   Weight as of this  encounter: 90.6 kg.    Procedures: None Consults: Neurology, Onco DVT Prophylaxis: SCDs >>Coumadin Code Status:Full code  Family Communication: son bedside 10/01/20, daughter bedside on 10/02/2020, daughter [name] [phone] 10/04/20, son bedside 10/05/20, daughter in law bedside 10/08/20  Time spent: 69 minutes-Greater than 50% of this time was spent in counseling, explanation of diagnosis, planning of further management, and coordination of care.  Diet: Diet Order             DIET SOFT Room service appropriate? Yes; Fluid consistency: Thin  Diet effective now                    Disposition Plan: Status is: Inpatient  Remains inpatient appropriate because:Inpatient level of care appropriate due to severity of illness  Dispo: The patient is from: Home              Anticipated d/c is to: Home              Patient currently is not medically stable to d/c.   Difficult to place patient No     Barriers to Discharge: Intractable headache/nausea/vomiting due to leptomeningeal involvement-lumbar puncture scheduled for this coming Monday.  Needs scheduled antiemetics to control symptoms.  Not yet stable for discharge.  Antimicrobial agents: Anti-infectives (From admission, onward)    Start     Dose/Rate Route Frequency Ordered Stop   10/04/20 1230  cefTRIAXone (ROCEPHIN) 1 g in sodium chloride 0.9 % 100 mL IVPB        1 g 200 mL/hr over 30 Minutes Intravenous Every 24 hours 10/04/20 1131 10/09/20 1229        MEDICATIONS: Scheduled Meds:  (feeding supplement) PROSource Plus  30 mL Oral TID BM   atorvastatin  40 mg Oral Daily   Chlorhexidine Gluconate Cloth  6 each Topical Daily   dexamethasone (DECADRON) injection  20 mg Intravenous Daily   diazepam  2 mg Oral BID   escitalopram  10 mg Oral Daily   feeding supplement  1 Container Oral TID BM   folic acid  2 mg Oral Daily   gabapentin  600 mg Oral BID   insulin aspart  0-5 Units Subcutaneous QHS   insulin aspart  0-9  Units Subcutaneous TID WC   levothyroxine  25 mcg Oral Q0600   metoCLOPramide (REGLAN) injection  5 mg Intravenous Q6H   multivitamin with minerals  1 tablet Oral Daily   ondansetron (ZOFRAN) IV  4 mg Intravenous Q8H   pantoprazole  40 mg Oral Daily   pramipexole  3 mg Oral QHS   thiamine  100 mg Oral Daily   vitamin B-12  1,000 mcg Oral Daily   Warfarin - Pharmacist Dosing Inpatient   Does not apply q1600   Continuous Infusions:  cefTRIAXone (ROCEPHIN)  IV 1 g (10/07/20 1351)   lactated ringers 75 mL/hr at 10/08/20 0749   promethazine (PHENERGAN) injection (IM or IVPB)      PRN Meds:.acetaminophen, fentaNYL (SUBLIMAZE) injection, labetalol, melatonin, polyethylene glycol, prochlorperazine, promethazine (PHENERGAN) injection (IM or IVPB)   I have personally reviewed following labs and imaging studies  LABORATORY DATA:  Recent Labs  Lab 10/03/20 0255 10/04/20 0049 10/05/20 0302 10/07/20 0047 10/08/20 0145  WBC 5.5 4.8 7.0 4.4 5.6  HGB 10.5* 10.4* 9.9* 9.6* 10.2*  HCT 31.8* 31.9* 30.1* 28.7* 31.1*  PLT 125* 143* 140* 140* 159  MCV 96.1 96.4 95.9 94.4 96.0  MCH 31.7  31.4 31.5 31.6 31.5  MCHC 33.0 32.6 32.9 33.4 32.8  RDW 15.1 14.7 14.9 14.4 14.5  LYMPHSABS 1.1 0.7 1.2 0.7 0.7  MONOABS 0.5 0.1 0.5 0.2 0.3  EOSABS 0.2 0.0 0.0 0.0 0.0  BASOSABS 0.1 0.0 0.0 0.0 0.0    Recent Labs  Lab 10/02/20 0148 10/03/20 0255 10/04/20 0049 10/05/20 0302 10/06/20 0112 10/07/20 0047 10/08/20 0145  NA 133* 131* 129* 133*  --  129* 131*  K 3.7 3.4* 4.4 4.0  --  3.4* 4.1  CL 97* 96* 97* 101  --  98 99  CO2 27 28 25 25   --  23 22  GLUCOSE 120* 125* 217* 157*  --  263* 261*  BUN 21 21 28* 40*  --  43* 52*  CREATININE 1.85* 1.81* 1.80* 1.78*  --  1.64* 1.57*  CALCIUM 8.8* 8.8* 9.0 9.1  --  8.4* 8.6*  AST 14* 13* 14* 13*  --  22 25  ALT 6 6 7 6   --  13 15  ALKPHOS 70 76 75 68  --  76 66  BILITOT 0.8 1.1 0.9 0.7  --  0.3 0.4  ALBUMIN 3.0* 3.0* 2.9* 2.8*  --  2.6* 2.7*  MG 1.3* 1.8  2.0 1.8  --  1.6* 1.7  INR 1.6* 1.3* 1.2 1.2 1.3* 1.3* 1.8*  BNP 420.3* 180.8* 318.8* 99.8  --   --   --        RADIOLOGY STUDIES/RESULTS: No results found.   LOS: 10 days   Signature  Lala Lund M.D on 10/08/2020 at 8:35 AM   -  To page go to [email or web address]

## 2020-10-09 ENCOUNTER — Ambulatory Visit: Payer: Medicare Other | Admitting: Family Medicine

## 2020-10-09 ENCOUNTER — Encounter: Payer: Self-pay | Admitting: Radiation Therapy

## 2020-10-09 ENCOUNTER — Ambulatory Visit: Payer: Medicare Other

## 2020-10-09 DIAGNOSIS — R112 Nausea with vomiting, unspecified: Secondary | ICD-10-CM | POA: Diagnosis not present

## 2020-10-09 DIAGNOSIS — N170 Acute kidney failure with tubular necrosis: Secondary | ICD-10-CM | POA: Diagnosis not present

## 2020-10-09 DIAGNOSIS — C8589 Other specified types of non-Hodgkin lymphoma, extranodal and solid organ sites: Secondary | ICD-10-CM | POA: Diagnosis not present

## 2020-10-09 DIAGNOSIS — E1142 Type 2 diabetes mellitus with diabetic polyneuropathy: Secondary | ICD-10-CM | POA: Diagnosis not present

## 2020-10-09 LAB — CBC WITH DIFFERENTIAL/PLATELET
Abs Immature Granulocytes: 0.09 10*3/uL — ABNORMAL HIGH (ref 0.00–0.07)
Basophils Absolute: 0 10*3/uL (ref 0.0–0.1)
Basophils Relative: 0 %
Eosinophils Absolute: 0 10*3/uL (ref 0.0–0.5)
Eosinophils Relative: 0 %
HCT: 29.4 % — ABNORMAL LOW (ref 39.0–52.0)
Hemoglobin: 9.9 g/dL — ABNORMAL LOW (ref 13.0–17.0)
Immature Granulocytes: 1 %
Lymphocytes Relative: 13 %
Lymphs Abs: 1 10*3/uL (ref 0.7–4.0)
MCH: 31.6 pg (ref 26.0–34.0)
MCHC: 33.7 g/dL (ref 30.0–36.0)
MCV: 93.9 fL (ref 80.0–100.0)
Monocytes Absolute: 0.5 10*3/uL (ref 0.1–1.0)
Monocytes Relative: 7 %
Neutro Abs: 6 10*3/uL (ref 1.7–7.7)
Neutrophils Relative %: 79 %
Platelets: 164 10*3/uL (ref 150–400)
RBC: 3.13 MIL/uL — ABNORMAL LOW (ref 4.22–5.81)
RDW: 14.5 % (ref 11.5–15.5)
WBC: 7.7 10*3/uL (ref 4.0–10.5)
nRBC: 0 % (ref 0.0–0.2)

## 2020-10-09 LAB — COMPREHENSIVE METABOLIC PANEL
ALT: 20 U/L (ref 0–44)
AST: 26 U/L (ref 15–41)
Albumin: 2.6 g/dL — ABNORMAL LOW (ref 3.5–5.0)
Alkaline Phosphatase: 69 U/L (ref 38–126)
Anion gap: 9 (ref 5–15)
BUN: 45 mg/dL — ABNORMAL HIGH (ref 8–23)
CO2: 24 mmol/L (ref 22–32)
Calcium: 8.5 mg/dL — ABNORMAL LOW (ref 8.9–10.3)
Chloride: 99 mmol/L (ref 98–111)
Creatinine, Ser: 1.49 mg/dL — ABNORMAL HIGH (ref 0.61–1.24)
GFR, Estimated: 46 mL/min — ABNORMAL LOW (ref >60)
Glucose, Bld: 191 mg/dL — ABNORMAL HIGH (ref 70–99)
Potassium: 3.7 mmol/L (ref 3.5–5.1)
Sodium: 132 mmol/L — ABNORMAL LOW (ref 135–145)
Total Bilirubin: 0.3 mg/dL (ref 0.3–1.2)
Total Protein: 5.1 g/dL — ABNORMAL LOW (ref 6.5–8.1)

## 2020-10-09 LAB — GLUCOSE, CAPILLARY
Glucose-Capillary: 168 mg/dL — ABNORMAL HIGH (ref 70–99)
Glucose-Capillary: 173 mg/dL — ABNORMAL HIGH (ref 70–99)
Glucose-Capillary: 251 mg/dL — ABNORMAL HIGH (ref 70–99)
Glucose-Capillary: 199 mg/dL — ABNORMAL HIGH (ref 70–99)

## 2020-10-09 LAB — PROTIME-INR
INR: 2.2 — ABNORMAL HIGH (ref 0.8–1.2)
Prothrombin Time: 24.8 seconds — ABNORMAL HIGH (ref 11.4–15.2)

## 2020-10-09 LAB — MAGNESIUM: Magnesium: 1.9 mg/dL (ref 1.7–2.4)

## 2020-10-09 MED ORDER — WARFARIN SODIUM 2.5 MG PO TABS
2.5000 mg | ORAL_TABLET | Freq: Once | ORAL | Status: AC
  Administered 2020-10-09: 2.5 mg via ORAL
  Filled 2020-10-09: qty 1

## 2020-10-09 MED ORDER — DEXAMETHASONE SODIUM PHOSPHATE 10 MG/ML IJ SOLN
8.0000 mg | Freq: Every day | INTRAMUSCULAR | Status: DC
  Administered 2020-10-09 – 2020-10-10 (×2): 8 mg via INTRAVENOUS
  Filled 2020-10-09 (×2): qty 1

## 2020-10-09 NOTE — Progress Notes (Signed)
Overall, Gary Yates is about the same.  The cytology on the spinal fluid does show atypical cells.  I think this is highly suggestive and highly consistent with the fact that his lymphoma has come back in the brain.  I do believe that radiation is the way to go.  This would be well-tolerated.  I think would be effective.  He still has a hard time hearing.  There is some visual issues.  Hopefully, these will improve.  His blood sugars are on the high side.  Again this is from the Decadron.  He is still not therapeutic with Coumadin.  His INR is 1.8.  It would really would be nice if he could go home today.  I am sure that he could get radiation as an outpatient easily enough.  His labs show white cell count of 5.6.  Hemoglobin 10.2.  Platelet count 159,000.  His albumin is only 2.7.  His BUN is 52 creatinine 1.57.  He does have the atrial fibrillation.  This does seem to be fairly well controlled.  Again, we are clearly looking at quality of life issues.  We are not going to cure this limb following the brain.  We concern to get this into remission.  I am sure that it would come out remission.  If we can see response, that his quality of life will improve and his lifespan will improve a little bit.  I told his daughter that I think that at best, we probably are looking at about 6 months.  Hopefully, they will be a decent quality of life during the 6 months.  His vital signs are all stable.  Temperature 97.5.  Pulse 75.  Blood pressure 125/63.  His lungs are clear.  Cardiac exam irregular rate and rhythm consistent with atrial fibrillation.  Abdomen is soft.  Again, there is not much that I need to do right now.  I think the neck step is clearly radiation.  I know that he has had great care from all staff up on [facility] [address].  I do appreciate all their outstanding work.  Lattie Haw, MD  Colossians 3:12

## 2020-10-09 NOTE — Progress Notes (Signed)
Physical Therapy Treatment Patient Details Name: Gary Yates. MRN: [id number] DOB: 11-May-1933 Today's Date: 10/09/2020   History of Present Illness [name] who presents on 09/28/20 with several week hx of dizziness, headache, N/V. 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 MRI brain with contrast: Masslike enhancement of the cerebral aqueduct-nodular enhancement along the ventricle and multiple cranial nerves-suspicious for leptomeningeal spread of malignancy. s/p LP with cytology/flow cytometry on 9/27. PMH includes A-fib, COPD, CAD, non-Hodgkin's lymphoma in remission, HTN, OSA, DM.    PT Comments    Pt progressing towards his physical therapy goals; reporting continued "blurry," vision but otherwise improved dizziness and tolerance for mobility this session. Pt requiring min-mod assist overall for functional mobility. Pt ambulating 15 ft with a walker and close chair follow. Gait belt provided to pt daughter and demonstrated guarding technique.    Recommendations for follow up therapy are one component of a multi-disciplinary discharge planning process, led by the attending physician.  Recommendations may be updated based on patient status, additional functional criteria and insurance authorization.  Follow Up Recommendations  Home health PT;Supervision/Assistance - 24 hour     Equipment Recommendations  Wheelchair (measurements PT);Wheelchair cushion (measurements PT)    Recommendations for Other Services       Precautions / Restrictions Precautions Precautions: Fall;Other (comment) Precaution Comments: dizziness, nystagmus Restrictions Weight Bearing Restrictions: No     Mobility  Bed Mobility Overal bed mobility: Needs Assistance Bed Mobility: Supine to Sit     Supine to sit: Mod assist     General bed mobility comments: Assist with  trunk elevation    Transfers Overall transfer level: Needs assistance Equipment used: Rolling walker (2 wheeled) Transfers: Sit to/from Stand Sit to Stand: Mod assist;From elevated surface         General transfer comment: Good reciprocal scooting to edge of bed, cues for wider BOS and placing feet posteriorly to knees, modA to rise. Multimodal cues for anterior weight shift and upright posture  Ambulation/Gait Ambulation/Gait assistance: Min assist Gait Distance (Feet): 15 Feet Assistive device: Rolling walker (2 wheeled) Gait Pattern/deviations: Step-through pattern;Decreased stride length;Trunk flexed;Decreased step length - right;Decreased dorsiflexion - left;Decreased dorsiflexion - right     General Gait Details: Pt requiring minA for balance, close chair follow, noted decreased R step length and decreased bilateral heel strike and foot clearance   Stairs             Wheelchair Mobility    Modified Rankin (Stroke Patients Only)       Balance Overall balance assessment: Needs assistance Sitting-balance support: Feet supported;Bilateral upper extremity supported Sitting balance-Leahy Scale: Poor Sitting balance - Comments: reliant on single-bilateral UE support   Standing balance support: Bilateral upper extremity supported;During functional activity Standing balance-Leahy Scale: Poor Standing balance comment: Reliant on UE and external support                            Cognition Arousal/Alertness: Awake/alert Behavior During Therapy: WFL for tasks assessed/performed Overall Cognitive Status: Within Functional Limits for tasks assessed                                        Exercises General Exercises - Lower Extremity Long Arc Quad: Both;10 reps;Seated Other Exercises  Other Exercises: Sitting: bilateral gastroc stretch x 1 min each    General Comments        Pertinent Vitals/Pain Pain Assessment: No/denies pain     Home Living                      Prior Function            PT Goals (current goals can now be found in the care plan section) Acute Rehab PT Goals Patient Stated Goal: to go home Potential to Achieve Goals: Good Progress towards PT goals: Progressing toward goals    Frequency    Min 3X/week      PT Plan Current plan remains appropriate    Co-evaluation              AM-PAC PT "6 Clicks" Mobility   Outcome Measure  Help needed turning from your back to your side while in a flat bed without using bedrails?: A Little Help needed moving from lying on your back to sitting on the side of a flat bed without using bedrails?: A Lot Help needed moving to and from a bed to a chair (including a wheelchair)?: A Little Help needed standing up from a chair using your arms (e.g., wheelchair or bedside chair)?: A Lot Help needed to walk in hospital room?: A Little Help needed climbing 3-5 steps with a railing? : Total 6 Click Score: 14    End of Session Equipment Utilized During Treatment: Gait belt Activity Tolerance: Patient tolerated treatment well Patient left: in chair;with call bell/phone within reach;with chair alarm set;with family/visitor present Nurse Communication: Mobility status PT Visit Diagnosis: Unsteadiness on feet (R26.81);Dizziness and giddiness (R42);Difficulty in walking, not elsewhere classified (R26.2);History of falling (Z91.81)     Time: 3557-3220 PT Time Calculation (min) (ACUTE ONLY): 23 min  Charges:  $Therapeutic Activity: 23-37 mins                     Wyona Almas, PT, DPT Acute Rehabilitation Services Pager [phone] Office [place] 10/09/2020, 10:00 AM

## 2020-10-09 NOTE — Progress Notes (Signed)
PROGRESS NOTE        PATIENT DETAILS Name: Gary Yates. Age: 87 y.o. Sex: male Date of Birth: 24-Nov-1933 Admit Date: 09/28/2020 Admitting Physician Orene Desanctis, DO YHC:[id number], Alinda Sierras, MD  Brief Narrative: Patient is a 87 y.o. 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  See below for further details.    9/23>>Urine Culture: Pending 9/22: CT abdomen/pelvis: No acute abnormality-prostatomegaly-no enlarged lymph nodes. 9/22: MRI brain-no contrast: T2 hyperintensity involving dorsal midbrain/pons/medulla 9/23>> MRI brain with contrast: Masslike enhancement of the cerebral aqueduct-nodular enhancement along the ventricle and multiple cranial nerves-suspicious for leptomeningeal spread of malignancy 9/29>> LP CSF - Numerous lymphoid cells are present, with some atypical forms and an occasional blast.  These findings are suggestive of lymphoma, consistent with the clinical diagnosis.   Subjective:   Patient in bed, appears comfortable, denies any headache, no fever, no chest pain or pressure, no shortness of breath , no abdominal pain. No new focal weakness.   Objective:  Vitals:  Blood pressure 113/69, pulse 74, temperature 97.7 F (36.5 C), temperature source Axillary, resp. rate 16, height 5\' 10"  (1.778 m), weight 90.6 kg, SpO2 90 %.   Exam:  Awake Alert, No new F.N deficits, Normal affect [place].AT,PERRAL Supple Neck,No JVD, No cervical lymphadenopathy appriciated.  Symmetrical Chest wall movement, Good air movement bilaterally, CTAB RRR,No Gallops, Rubs or new Murmurs, No Parasternal Heave +ve B.Sounds, Abd Soft, No tenderness, No organomegaly appriciated, No rebound - guarding or  rigidity. No Cyanosis, Clubbing or edema, No new Rash or bruise   Assessment/Plan:  Headache with intractable nausea/vomiting/diplopia/nystagmus: Likely due to leptomeningeal involvement of CNS from underlying malignancy.  Headache better-nausea vomiting still an issue will place him on scheduled Valium along with scheduled Zofran and as needed Phenergan and monitor.  CSF from LP done on 10/05/2020 consistent with malignant cells, cytology pending, prognosis is extremely poor, symptoms better on Decadron 20 daily, will gently taper the dose. Case DW Oncologist-Dr. Marin Olp DC with outpt Radiation and HHPT/RN .  CSF - Numerous lymphoid cells are present, with some atypical forms and an occasional blast.  These findings are suggestive of lymphoma, consistent with the clinical diagnosis.    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  AKI on CKD stage IIIa: AKI likely hemodynamically mediated in the setting of nausea/vomiting-improved with supportive care.  PAF with underlying LBBB: Rate controlled -  resumed Coumadin.  Hypothyroidism: Continue Synthroid  Hypokalemia, Hypomagnesemia - replaced  HTN: BP controlled-monitor off antihypertensives.  Hyponatremia.  Appears dehydrated.  IV fluids on 10/07/2020.    UTI.  Empiric Rocephin x5 days.    DM-2 (A1c 6.3 on 7/27): CBG stable-on SSI.  Recent Labs    10/08/20 1614 10/08/20 2118 10/09/20 0831  GLUCAP 261* 216* 168*    Peripheral neuropathy: On Neurontin.  Nutrition Status: Nutrition Problem: Predicted suboptimal nutrient intake Etiology: nausea, vomiting Signs/Symptoms: per patient/family report Interventions: Ensure Enlive (each supplement provides 350kcal and 20 grams of protein), Magic cup, MVI  Estimated body mass index is 28.66 kg/m as calculated from the following:   Height as of this encounter: 5\' 10"  (1.778 m).  Weight as of this encounter: 90.6 kg.     Procedures: None Consults: Neurology, Onco DVT Prophylaxis: SCDs >>Coumadin Code Status:Full code  Family Communication: son bedside 10/01/20, daughter bedside on 10/02/2020, daughter [name] [phone] 10/04/20, son bedside 10/05/20, daughter in law bedside 10/08/20  Time spent: 43 minutes-Greater than 50% of this time was spent in counseling, explanation of diagnosis, planning of further management, and coordination of care.  Diet: Diet Order             DIET SOFT Room service appropriate? Yes; Fluid consistency: Thin  Diet effective now                    Disposition Plan: Status is: Inpatient  Remains inpatient appropriate because:Inpatient level of care appropriate due to severity of illness  Dispo: The patient is from: Home              Anticipated d/c is to: Home              Patient currently is not medically stable to d/c.   Difficult to place patient No     Barriers to Discharge: Intractable headache/nausea/vomiting due to leptomeningeal involvement-lumbar puncture scheduled for this coming Monday.  Needs scheduled antiemetics to control symptoms.  Not yet stable for discharge.  Antimicrobial agents: Anti-infectives (From admission, onward)    Start     Dose/Rate Route Frequency Ordered Stop   10/04/20 1230  cefTRIAXone (ROCEPHIN) 1 g in sodium chloride 0.9 % 100 mL IVPB        1 g 200 mL/hr over 30 Minutes Intravenous Every 24 hours 10/04/20 1131 10/08/20 1212        MEDICATIONS: Scheduled Meds:  (feeding supplement) PROSource Plus  30 mL Oral TID BM   atorvastatin  40 mg Oral Daily   Chlorhexidine Gluconate Cloth  6 each Topical Daily   dexamethasone (DECADRON) injection  8 mg Intravenous Daily   diazepam  2 mg Oral BID   escitalopram  10 mg Oral Daily   feeding supplement  1 Container Oral TID BM   folic acid  2 mg Oral Daily   gabapentin  600 mg Oral BID   insulin aspart  0-5 Units Subcutaneous QHS   insulin aspart  0-9 Units Subcutaneous TID  WC   levothyroxine  25 mcg Oral Q0600   metoCLOPramide (REGLAN) injection  5 mg Intravenous Q6H   multivitamin with minerals  1 tablet Oral Daily   ondansetron (ZOFRAN) IV  4 mg Intravenous Q8H   pantoprazole  40 mg Oral Daily   pramipexole  3 mg Oral QHS   thiamine  100 mg Oral Daily   vitamin B-12  1,000 mcg Oral Daily   warfarin  2.5 mg Oral ONCE-1600   Warfarin - Pharmacist Dosing Inpatient   Does not apply q1600   Continuous Infusions:  promethazine (PHENERGAN) injection (IM or IVPB)      PRN Meds:.acetaminophen, fentaNYL (SUBLIMAZE) injection, labetalol, melatonin, polyethylene glycol, prochlorperazine, promethazine (PHENERGAN) injection (IM or IVPB)   I have personally reviewed following labs and imaging studies  LABORATORY DATA:  Recent Labs  Lab 10/04/20 0049 10/05/20 0302 10/07/20 0047 10/08/20 0145 10/09/20 0615  WBC 4.8 7.0 4.4 5.6 7.7  HGB 10.4* 9.9* 9.6* 10.2* 9.9*  HCT 31.9* 30.1* 28.7* 31.1* 29.4*  PLT 143* 140* 140* 159 164  MCV 96.4 95.9 94.4 96.0 93.9  MCH 31.4 31.5 31.6 31.5 31.6  MCHC 32.6 32.9 33.4 32.8 33.7  RDW 14.7 14.9 14.4  14.5 14.5  LYMPHSABS 0.7 1.2 0.7 0.7 1.0  MONOABS 0.1 0.5 0.2 0.3 0.5  EOSABS 0.0 0.0 0.0 0.0 0.0  BASOSABS 0.0 0.0 0.0 0.0 0.0    Recent Labs  Lab 10/03/20 0255 10/04/20 0049 10/05/20 0302 10/06/20 0112 10/07/20 0047 10/08/20 0145 10/09/20 0615 10/09/20 0951  NA 131* 129* 133*  --  129* 131* 132*  --   K 3.4* 4.4 4.0  --  3.4* 4.1 3.7  --   CL 96* 97* 101  --  98 99 99  --   CO2 28 25 25   --  23 22 24   --   GLUCOSE 125* 217* 157*  --  263* 261* 191*  --   BUN 21 28* 40*  --  43* 52* 45*  --   CREATININE 1.81* 1.80* 1.78*  --  1.64* 1.57* 1.49*  --   CALCIUM 8.8* 9.0 9.1  --  8.4* 8.6* 8.5*  --   AST 13* 14* 13*  --  22 25 26   --   ALT 6 7 6   --  13 15 20   --   ALKPHOS 76 75 68  --  76 66 69  --   BILITOT 1.1 0.9 0.7  --  0.3 0.4 0.3  --   ALBUMIN 3.0* 2.9* 2.8*  --  2.6* 2.7* 2.6*  --   MG 1.8 2.0 1.8   --  1.6* 1.7 1.9  --   INR 1.3* 1.2 1.2 1.3* 1.3* 1.8*  --  2.2*  BNP 180.8* 318.8* 99.8  --   --   --   --   --        RADIOLOGY STUDIES/RESULTS: No results found.   LOS: 11 days   Signature  Lala Lund M.D on 10/09/2020 at 10:48 AM   -  To page go to [email or web address]

## 2020-10-09 NOTE — Care Management (Signed)
Patient requires wheelchair d/t increased weakness, will need for community access. Ricki Miller, RN BSN Case Manager

## 2020-10-09 NOTE — Progress Notes (Signed)
   Palliative Medicine Inpatient Follow Up Note     Chart Reviewed. Patient assessed at the bedside. Sitting up in the recliner eating. Denies pain. States his vision is blurry. No family at the bedside.   Patient is expected to discharge later today. Per notations recommendations to offer whole brain radiation, MRI of spine to r/o leptomeningeal involvement. Simulation is planned for 10/11/2020.   Given severity of disease, poor long-term prognosis. Would recommend ongoing palliative support. Patient can be seen at [facility] in collaboration with radiation treatments.   It will be of importance for continued conversation with family and medical providers regarding overall plan of care and treatment options, ensuring decisions are within the context of the patients values and GOCs.   Objective Assessment: Vital Signs Vitals:   10/09/20 0642 10/09/20 0829  BP: 116/62 113/69  Pulse: 64 74  Resp: 16 16  Temp: 97.9 F (36.6 C) 97.7 F (36.5 C)  SpO2: 97% 90%    Intake/Output Summary (Last 24 hours) at 10/09/2020 1150 Last data filed at 10/09/2020 0175 Gross per 24 hour  Intake 1483.17 ml  Output 1725 ml  Net -241.83 ml   Last Weight  Most recent update: 10/07/2020  4:47 AM    Weight  90.6 kg (199 lb 11.8 oz)            Gen:  NAD, awake and alert sitting up  HEENT: moist mucous membranes CV: RRR PULM: clear to auscultation bilaterally. No wheezes/rales/rhonchi ABD: soft/nontender/nondistended/normal bowel sounds Neuro: Alert, mood appropriate   SUMMARY OF RECOMMENDATIONS   Continue with current plan of care per medical team  DNR/DNI Plans to discharge later today and whole brain radiation (Sim on 10/4).  Recommend ongoing outpatient palliative support at [facility] in collaboration with XRT visits.  PMT will continue to support and follow on as needed basis. Please secure chat for urgent needs.   Time Total: 20 min.   Visit consisted of counseling and  education dealing with the complex and emotionally intense issues of symptom management and palliative care in the setting of serious and potentially life-threatening illness.Greater than 50%  of this time was spent counseling and coordinating care related to the above assessment and plan.  Alda Lea, AGPCNP-BC  Palliative Medicine Team [phone]  Palliative Medicine Team providers are available by phone from 7am to 7pm daily and can be reached through the team cell phone. Should this patient require assistance outside of these hours, please call the patient's attending physician.

## 2020-10-09 NOTE — TOC Progression Note (Signed)
Transition of Care (TOC) - Progression Note    Patient Details  Name: Gary Yates. MRN: [id number] Date of Birth: 09-03-1933  Transition of Care [facility]) CM/SW Contact  Loletha Grayer Beverely Pace, RN Phone Number: 10/09/2020, 11:39 AM  Clinical Narrative:  Case manager spoke with patient's daughter, Gary Yates, [phone], concerning discharge needs. [name] has requested a hospital bed and ambulance transport home for her dad. She states her dad can't come home until bed is in place, DME will be delivered sometime today, may be this evening. MD notified, patient will discharge tomorrow via [place].     Expected Discharge Plan: Home w Hospice Care Barriers to Discharge: No Barriers Identified  Expected Discharge Plan and Services Expected Discharge Plan: [place]   Discharge Planning Services: CM Consult Post Acute Care Choice: Durable Medical Equipment, Home Health Living arrangements for the past 2 months: Single Family Home Expected Discharge Date: 10/09/20               DME Arranged: Wheelchair manual, Hospital bed DME Agency: [employer] Date DME Agency Contacted: 10/09/20 Time DME Agency Contacted: 52 Representative spoke with at DME Agency: [place]: PT, OT, Nurse's Aide [place] Agency: [facility]         Social Determinants of Health (SDOH) Interventions    Readmission Risk Interventions Readmission Risk Prevention Plan 08/10/2019  Transportation Screening Complete  Medication Review Press photographer) Complete  PCP or Specialist appointment within 3-5 days of discharge Complete  HRI or [place] Complete  SW Recovery Care/Counseling Consult Complete  Palliative Care Screening Not [place] Complete  Some recent data might be hidden

## 2020-10-09 NOTE — Progress Notes (Signed)
[name] was discussed in the brain and spine MDC this morning. Recommendation is to offer whole brain radiation and to get spinal MRI imaging to r/o leptomeningeal involvement of the spine. He is not a candidate for cranial spinal radiation.      [name] will be discharged from the hospital today, so he will be simulated on Wed 10/5 for his whole brain course.   Mont Dutton R.T.(R)(T) Radiation Special Procedures Navigator

## 2020-10-09 NOTE — Progress Notes (Signed)
[place] for Warfarin Indication: atrial fibrillation  Allergies  Allergen Reactions   Latex Other (See Comments)    Reddens the skin   Ace Inhibitors Cough   Codeine Nausea Only and Rash        Compazine [Prochlorperazine] Anxiety   Penicillins Rash    Childhood allergy Has patient had a PCN reaction causing immediate rash, facial/tongue/throat swelling, SOB or lightheadedness with hypotension: Yes Has patient had a PCN reaction causing severe rash involving mucus membranes or skin necrosis: Yes Has patient had a PCN reaction that required hospitalization No Has patient had a PCN reaction occurring within the last 10 years: No If all of the above answers are "NO", then may proceed with Cephalosporin use.     Patient Measurements: Height: 5\' 10"  (177.8 cm) Weight: 90.6 kg (199 lb 11.8 oz) IBW/kg (Calculated) : 73  Vital Signs: Temp: 97.7 F (36.5 C) (10/03 0829) Temp Source: Axillary (10/03 0829) BP: 113/69 (10/03 0829) Pulse Rate: 74 (10/03 0829)  Labs: Recent Labs    10/07/20 0047 10/08/20 0145 10/09/20 0615 10/09/20 0951  HGB 9.6* 10.2* 9.9*  --   HCT 28.7* 31.1* 29.4*  --   PLT 140* 159 164  --   LABPROT 16.4* 20.7*  --  24.8*  INR 1.3* 1.8*  --  2.2*  CREATININE 1.64* 1.57* 1.49*  --     Estimated Creatinine Clearance: 41 mL/min (A) (by C-G formula based on SCr of 1.49 mg/dL (H)).   Medications:  Scheduled:   (feeding supplement) PROSource Plus  30 mL Oral TID BM   atorvastatin  40 mg Oral Daily   Chlorhexidine Gluconate Cloth  6 each Topical Daily   dexamethasone (DECADRON) injection  8 mg Intravenous Daily   diazepam  2 mg Oral BID   escitalopram  10 mg Oral Daily   feeding supplement  1 Container Oral TID BM   folic acid  2 mg Oral Daily   gabapentin  600 mg Oral BID   insulin aspart  0-5 Units Subcutaneous QHS   insulin aspart  0-9 Units Subcutaneous TID WC   levothyroxine  25 mcg Oral Q0600    metoCLOPramide (REGLAN) injection  5 mg Intravenous Q6H   multivitamin with minerals  1 tablet Oral Daily   ondansetron (ZOFRAN) IV  4 mg Intravenous Q8H   pantoprazole  40 mg Oral Daily   pramipexole  3 mg Oral QHS   thiamine  100 mg Oral Daily   vitamin B-12  1,000 mcg Oral Daily   Warfarin - Pharmacist Dosing Inpatient   Does not apply q1600   Infusions:   promethazine (PHENERGAN) injection (IM or IVPB)      Assessment: PTA, patient was on warfarin PTA with 5 mg on Monday and Thursday, and 2.5 mg all other days for pAF. INR increased from 1.3 > 2.2 in < 48 hours. H/h appears lower but stable. No s/sx bleeding reported. Will opt to continue the lower PTA warfarin dosing at 2.5mg  instead due to significant increase.   If patient do discharge today, could consider continuing at warfarin 2.5mg  daily until follow-up appt with anticoagulation clinic later this week.  Goal of Therapy:  INR 2-3 Monitor platelets by anticoagulation protocol: Yes   Plan:  Warfarin 2.5mg  x1 this evening Check INR daily while on warfarin Continue to monitor H&H and platelets   Thank you for allowing pharmacy to be a part of this patient's care.  Ardyth Harps, PharmD Clinical Pharmacist

## 2020-10-10 ENCOUNTER — Other Ambulatory Visit (HOSPITAL_COMMUNITY): Payer: Self-pay

## 2020-10-10 ENCOUNTER — Encounter: Payer: Self-pay | Admitting: Hematology & Oncology

## 2020-10-10 ENCOUNTER — Ambulatory Visit: Payer: Medicare Other

## 2020-10-10 DIAGNOSIS — R112 Nausea with vomiting, unspecified: Secondary | ICD-10-CM | POA: Diagnosis not present

## 2020-10-10 LAB — PROTIME-INR
INR: 2.1 — ABNORMAL HIGH (ref 0.8–1.2)
Prothrombin Time: 23.8 seconds — ABNORMAL HIGH (ref 11.4–15.2)

## 2020-10-10 LAB — COMPREHENSIVE METABOLIC PANEL
ALT: 20 U/L (ref 0–44)
AST: 22 U/L (ref 15–41)
Albumin: 2.6 g/dL — ABNORMAL LOW (ref 3.5–5.0)
Alkaline Phosphatase: 79 U/L (ref 38–126)
Anion gap: 11 (ref 5–15)
BUN: 51 mg/dL — ABNORMAL HIGH (ref 8–23)
CO2: 21 mmol/L — ABNORMAL LOW (ref 22–32)
Calcium: 8.3 mg/dL — ABNORMAL LOW (ref 8.9–10.3)
Chloride: 96 mmol/L — ABNORMAL LOW (ref 98–111)
Creatinine, Ser: 1.62 mg/dL — ABNORMAL HIGH (ref 0.61–1.24)
GFR, Estimated: 41 mL/min — ABNORMAL LOW (ref >60)
Glucose, Bld: 238 mg/dL — ABNORMAL HIGH (ref 70–99)
Potassium: 3.7 mmol/L (ref 3.5–5.1)
Sodium: 128 mmol/L — ABNORMAL LOW (ref 135–145)
Total Bilirubin: 0.4 mg/dL (ref 0.3–1.2)
Total Protein: 5.2 g/dL — ABNORMAL LOW (ref 6.5–8.1)

## 2020-10-10 LAB — CBC WITH DIFFERENTIAL/PLATELET
Abs Immature Granulocytes: 0.14 10*3/uL — ABNORMAL HIGH (ref 0.00–0.07)
Basophils Absolute: 0 10*3/uL (ref 0.0–0.1)
Basophils Relative: 0 %
Eosinophils Absolute: 0 10*3/uL (ref 0.0–0.5)
Eosinophils Relative: 0 %
HCT: 29.8 % — ABNORMAL LOW (ref 39.0–52.0)
Hemoglobin: 9.6 g/dL — ABNORMAL LOW (ref 13.0–17.0)
Immature Granulocytes: 2 %
Lymphocytes Relative: 12 %
Lymphs Abs: 0.9 10*3/uL (ref 0.7–4.0)
MCH: 31.3 pg (ref 26.0–34.0)
MCHC: 32.2 g/dL (ref 30.0–36.0)
MCV: 97.1 fL (ref 80.0–100.0)
Monocytes Absolute: 0.4 10*3/uL (ref 0.1–1.0)
Monocytes Relative: 5 %
Neutro Abs: 5.9 10*3/uL (ref 1.7–7.7)
Neutrophils Relative %: 81 %
Platelets: 156 10*3/uL (ref 150–400)
RBC: 3.07 MIL/uL — ABNORMAL LOW (ref 4.22–5.81)
RDW: 14.5 % (ref 11.5–15.5)
WBC: 7.3 10*3/uL (ref 4.0–10.5)
nRBC: 0 % (ref 0.0–0.2)

## 2020-10-10 LAB — GLUCOSE, CAPILLARY: Glucose-Capillary: 205 mg/dL — ABNORMAL HIGH (ref 70–99)

## 2020-10-10 LAB — MAGNESIUM: Magnesium: 1.9 mg/dL (ref 1.7–2.4)

## 2020-10-10 MED ORDER — DEXAMETHASONE 6 MG PO TABS: 6.0000 mg | ORAL_TABLET | Freq: Every day | ORAL | 0 refills | Status: DC

## 2020-10-10 MED ORDER — WARFARIN SODIUM 5 MG PO TABS: 5.0000 mg | ORAL_TABLET | Freq: Once | ORAL | Status: DC

## 2020-10-10 MED ORDER — MAGNESIUM SULFATE IN D5W 1-5 GM/100ML-% IV SOLN
1.0000 g | Freq: Once | INTRAVENOUS | Status: AC
  Administered 2020-10-10: 1 g via INTRAVENOUS
  Filled 2020-10-10: qty 100

## 2020-10-10 MED ORDER — ONDANSETRON HCL 4 MG PO TABS: 4.0000 mg | ORAL_TABLET | Freq: Three times a day (TID) | ORAL | 0 refills | Status: DC | PRN

## 2020-10-10 MED ORDER — DEXAMETHASONE 6 MG PO TABS: 6.0000 mg | ORAL_TABLET | Freq: Two times a day (BID) | ORAL | 0 refills | Status: DC

## 2020-10-10 MED ORDER — FUROSEMIDE 40 MG PO TABS
40.0000 mg | ORAL_TABLET | Freq: Once | ORAL | Status: AC
  Administered 2020-10-10: 40 mg via ORAL
  Filled 2020-10-10: qty 1

## 2020-10-10 MED ORDER — DOXYCYCLINE HYCLATE 100 MG PO TABS
100.0000 mg | ORAL_TABLET | Freq: Two times a day (BID) | ORAL | 0 refills | Status: AC
  Filled 2020-10-10: qty 9, 5d supply, fill #0

## 2020-10-10 MED ORDER — BLOOD GLUCOSE MONITOR KIT: PACK | 1 refills | Status: DC

## 2020-10-10 MED ORDER — POTASSIUM CHLORIDE CRYS ER 20 MEQ PO TBCR
20.0000 meq | EXTENDED_RELEASE_TABLET | Freq: Once | ORAL | Status: AC
  Administered 2020-10-10: 20 meq via ORAL
  Filled 2020-10-10: qty 1

## 2020-10-10 MED ORDER — DEXAMETHASONE 6 MG PO TABS
6.0000 mg | ORAL_TABLET | Freq: Every day | ORAL | 0 refills | Status: DC
  Filled 2020-10-10: qty 30, 30d supply, fill #0

## 2020-10-10 MED ORDER — BLOOD GLUCOSE MONITOR SYSTEM W/DEVICE KIT
PACK | 1 refills | Status: AC
  Filled 2020-10-10: qty 1, 30d supply, fill #0

## 2020-10-10 MED ORDER — ONDANSETRON HCL 4 MG PO TABS
4.0000 mg | ORAL_TABLET | Freq: Three times a day (TID) | ORAL | 0 refills | Status: AC | PRN
  Filled 2020-10-10: qty 20, 7d supply, fill #0

## 2020-10-10 MED ORDER — INSULIN LISPRO (1 UNIT DIAL) 100 UNIT/ML (KWIKPEN)
PEN_INJECTOR | SUBCUTANEOUS | 0 refills | Status: AC
  Filled 2020-10-10: qty 9, 30d supply, fill #0

## 2020-10-10 MED ORDER — ACCU-CHEK SOFTCLIX LANCETS MISC
0 refills | Status: AC
  Filled 2020-10-10: qty 100, 25d supply, fill #0

## 2020-10-10 MED ORDER — DOXYCYCLINE HYCLATE 100 MG PO TABS
100.0000 mg | ORAL_TABLET | Freq: Two times a day (BID) | ORAL | Status: DC
  Administered 2020-10-10: 100 mg via ORAL
  Filled 2020-10-10: qty 1

## 2020-10-10 MED ORDER — INSULIN ASPART 100 UNIT/ML FLEXPEN: PEN_INJECTOR | SUBCUTANEOUS | 0 refills | Status: DC

## 2020-10-10 MED ORDER — DOXYCYCLINE HYCLATE 100 MG PO TABS: 100.0000 mg | ORAL_TABLET | Freq: Two times a day (BID) | ORAL | 0 refills | Status: DC

## 2020-10-10 MED ORDER — INSULIN PEN NEEDLE 32G X 4 MM MISC
0 refills | Status: AC
Start: 2020-10-10 — End: ?
  Filled 2020-10-10: qty 100, 25d supply, fill #0

## 2020-10-10 MED ORDER — GLUCOSE BLOOD VI STRP
ORAL_STRIP | 0 refills | Status: AC
  Filled 2020-10-10: qty 100, 25d supply, fill #0

## 2020-10-10 NOTE — TOC Transition Note (Signed)
Transition of Care [facility]) - CM/SW Discharge Note   Patient Details  Name: Gary Yates. MRN: [id number] Date of Birth: 21-Feb-1933  Transition of Care [facility]) CM/SW Contact:  Carles Collet, RN Phone Number: 10/10/2020, 8:58 AM   Clinical Narrative:    Gary Yates w patient's daughter Gary Yates, [phone]. Confirmed all DME has been delivered to the home. She would like discharged via [facility] no sooner than 11am. Verified demographics.  Notified [facility] of 11am pick up request. Notified [facility] of DC. Per palliative note patient will continue w outpt palliative care at cancer center.      Final next level of care: [place] Barriers to Discharge: No Barriers Identified   Patient Goals and CMS Choice Patient states their goals for this hospitalization and ongoing recovery are:: discussions w patient/ family indiacte home w home health and ongoing palliative support at cancer center CMS Medicare.gov Compare Post Acute Care list provided to:: Other (Comment Required) Choice offered to / list presented to : Adult Children  Discharge Placement                       Discharge Plan and Services   Discharge Planning Services: CM Consult Post Acute Care Choice: Durable Medical Equipment, Home Health          DME Arranged: Wheelchair manual, Hospital bed DME Agency: [employer] Date DME Agency Contacted: 10/09/20 Time DME Agency Contacted: 55 Representative spoke with at DME Agency: [place]: PT, OT, Nurse's Aide [place] Agency: [employer] Date [place]: 10/10/20 Time [place]: [phone] Representative spoke with at [place]: [place] ([place]) Interventions     Readmission Risk Interventions Readmission Risk Prevention Plan 08/10/2019  Transportation Screening Complete  Medication Review Press photographer) Complete  PCP or Specialist appointment within 3-5 days of discharge Complete  HRI or [place] Complete  SW Recovery Care/Counseling Consult Complete  Palliative Care Screening Not [place] Complete  Some recent data might be hidden

## 2020-10-10 NOTE — Progress Notes (Addendum)
Notified md, charge nurse and iv team, noted patient pac site brown and raised, site is not accessed at this time, afebrile 98.8 @0830 .   Md viewed site, site is ok.

## 2020-10-10 NOTE — Progress Notes (Signed)
Gary Yates for Warfarin Indication: atrial fibrillation  Allergies  Allergen Reactions   Latex Other (See Comments)    Reddens the skin   Ace Inhibitors Cough   Codeine Nausea Only and Rash        Compazine [Prochlorperazine] Anxiety   Penicillins Rash    Childhood allergy Has patient had a PCN reaction causing immediate rash, facial/tongue/throat swelling, SOB or lightheadedness with hypotension: Yes Has patient had a PCN reaction causing severe rash involving mucus membranes or skin necrosis: Yes Has patient had a PCN reaction that required hospitalization No Has patient had a PCN reaction occurring within the last 10 years: No If all of the above answers are "NO", then may proceed with Cephalosporin use.     Patient Measurements: Height: 5\' 10"  (177.8 cm) Weight: 90.6 kg (199 lb 11.8 oz) IBW/kg (Calculated) : 73  Vital Signs: Temp: 98.8 F (37.1 C) (10/04 0830) Temp Source: Oral (10/04 0830) BP: 122/65 (10/04 0830) Pulse Rate: 80 (10/04 0830)  Labs: Recent Labs    10/08/20 0145 10/09/20 0615 10/09/20 0951 10/10/20 0055  HGB 10.2* 9.9*  --  9.6*  HCT 31.1* 29.4*  --  29.8*  PLT 159 164  --  156  LABPROT 20.7*  --  24.8* 23.8*  INR 1.8*  --  2.2* 2.1*  CREATININE 1.57* 1.49*  --  1.62*    Estimated Creatinine Clearance: 37.7 mL/min (A) (by C-G formula based on SCr of 1.62 mg/dL (H)).   Medications:  Scheduled:   (feeding supplement) PROSource Plus  30 mL Oral TID BM   atorvastatin  40 mg Oral Daily   Chlorhexidine Gluconate Cloth  6 each Topical Daily   dexamethasone (DECADRON) injection  8 mg Intravenous Daily   diazepam  2 mg Oral BID   doxycycline  100 mg Oral Q12H   escitalopram  10 mg Oral Daily   feeding supplement  1 Container Oral TID BM   folic acid  2 mg Oral Daily   furosemide  40 mg Oral Once   gabapentin  600 mg Oral BID   insulin aspart  0-5 Units Subcutaneous QHS   insulin aspart  0-9 Units  Subcutaneous TID WC   levothyroxine  25 mcg Oral Q0600   metoCLOPramide (REGLAN) injection  5 mg Intravenous Q6H   multivitamin with minerals  1 tablet Oral Daily   ondansetron (ZOFRAN) IV  4 mg Intravenous Q8H   pantoprazole  40 mg Oral Daily   potassium chloride  20 mEq Oral Once   pramipexole  3 mg Oral QHS   thiamine  100 mg Oral Daily   vitamin B-12  1,000 mcg Oral Daily   Warfarin - Pharmacist Dosing Inpatient   Does not apply q1600   Infusions:   magnesium sulfate bolus IVPB     promethazine (PHENERGAN) injection (IM or IVPB)      Assessment: PTA, patient was on warfarin PTA with 5 mg on Monday and Thursday, and 2.5 mg all other days for pAF. INR increased from 1.3 > 2.2 in < 48 hours. 2.5mg  warfarin was given 10/03 instead of 5mg  due to this significant increase. INR has now remained constant with slight decrease from 2.2>2.1. H/h appears lower but stable. No s/sx bleeding reported. Will give 5mg  today and resume PTA regimen.  If patient do discharge today, could consider resuming patient's warfarin regimen prior to admission of 2.5mg  daily except 5mg  on Monday/Thursday.   Goal of Therapy:  INR 2-3  Monitor platelets by anticoagulation protocol: Yes   Plan:  Warfarin 5mg  x1 this evening Check INR daily while on warfarin Continue to monitor H&H and platelets   Thank you for allowing pharmacy to be a part of this patient's care.  Ardyth Harps, PharmD Clinical Pharmacist

## 2020-10-10 NOTE — Progress Notes (Signed)
Pt was awaiting transport home via ptar, family decided to transport patient home via pov, patient received discharge packet and education, all questions and concerns have been addressed. Pt is off unit with family.

## 2020-10-10 NOTE — Discharge Instructions (Signed)
Follow with Primary MD [name], MD in 7 days   Get CBC, CMP, INR, Magnesium -  checked next visit within 1 week by Primary MD    Activity: As tolerated with Full fall precautions use walker/cane & assistance as needed  Disposition Home     Diet: Heart Healthy    Special Instructions: If you have smoked or chewed Tobacco  in the last 2 yrs please stop smoking, stop any regular Alcohol  and or any Recreational drug use.  On your next visit with your primary care physician please Get Medicines reviewed and adjusted.  Please request your Prim.MD to go over all Hospital Tests and Procedure/Radiological results at the follow up, please get all Hospital records sent to your Prim MD by signing hospital release before you go home.  If you experience worsening of your admission symptoms, develop shortness of breath, life threatening emergency, suicidal or homicidal thoughts you must seek medical attention immediately by calling 911 or calling your MD immediately  if symptoms less severe.  You Must read complete instructions/literature along with all the possible adverse reactions/side effects for all the Medicines you take and that have been prescribed to you. Take any new Medicines after you have completely understood and accpet all the possible adverse reactions/side effects.

## 2020-10-10 NOTE — Discharge Summary (Signed)
Gary Yates. EXN:[id number] DOB: 02-Feb-1933 DOA: 09/28/2020  PCP: [name], MD  Admit date: 09/28/2020  Discharge date: 10/10/2020  Admitted From: Home  Disposition:  Home   Recommendations for Outpatient Follow-up:   Follow up with PCP in 1-2 weeks  PCP Please obtain BMP/CBC, 2 view CXR in 1week,  (see Discharge instructions)   PCP Please follow up on the following pending results:    Home Health: PT, RN   Equipment/Devices: as below  Consultations: Neuro, Onc, Pall Care Discharge Condition: Guarded   CODE STATUS: Full DNR   Diet Recommendation: Heart Healthy Low Carb  Diet Order             Diet - low sodium heart healthy           DIET SOFT Room service appropriate? Yes; Fluid consistency: Thin  Diet effective now                    Chief Complaint  Patient presents with   Emesis     Brief history of present illness from the day of admission and additional interim summary    Patient is a 87 y.o. 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  See below for further details.       9/23>>Urine Culture: Pending 9/22: CT abdomen/pelvis: No acute abnormality-prostatomegaly-no enlarged lymph nodes. 9/22: MRI brain-no contrast: T2 hyperintensity involving dorsal midbrain/pons/medulla 9/23>> MRI brain with contrast: Masslike enhancement of the cerebral aqueduct-nodular enhancement along the ventricle and multiple cranial nerves-suspicious for leptomeningeal spread of malignancy 9/29>> LP CSF - Numerous lymphoid cells are present,  with some atypical forms and an occasional blast.  These findings are suggestive of lymphoma, consistent with the clinical diagnosis.                                                                 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 Case DW Oncologist-Dr. Marin Olp DC with outpt Radiation and HHPT, note treatments are palliative in nature with prognosis of 76mths with treatments.   CSF - Numerous lymphoid cells are present, with some atypical forms and an occasional blast.  These findings are suggestive of lymphoma, consistent with the clinical diagnosis.       History of diffuse large B-cell lymphoma involving right testicle: See above-was seen by oncology.   AKI on CKD  stage IIIa: AKI likely hemodynamically mediated in the setting of nausea/vomiting-improved with supportive care.  PAF with underlying LBBB: Rate controlled -  resumed Coumadin.   Hypothyroidism: Continue Synthroid   Hypokalemia, Hypomagnesemia - replaced   HTN: BP controlled-monitor off antihypertensives.   Hyponatremia.  SIADH - Lasix today, PCP to repeat in 7-10 days.     UTI.  could be colonization with Staph Epi, 5 days of Doxy as  mild ? symptoms .     DM-2 (A1c 6.3 on 7/27): on Home Rx add ISS as now on Decadron.  Peripheral neuropathy: On Neurontin.   Discharge diagnosis     Principal Problem:   Intractable vomiting with nausea Active Problems:   Essential hypertension   Diabetic peripheral neuropathy (HCC)   Paroxysmal atrial fibrillation (HCC)   Diffuse non-Hodgkin's lymphoma of testis (HCC)   Headache   Acute-on-chronic kidney injury ([place])   Hypothyroidism    Discharge  instructions    Discharge Instructions     Diet - low sodium heart healthy   Complete by: As directed    Discharge instructions   Complete by: As directed    Follow with Primary MD [name], MD in 7 days   Get CBC, CMP, INR, Magnesium -  checked next visit within 1 week by Primary MD    Activity: As tolerated with Full fall precautions use walker/cane & assistance as needed  Disposition Home     Diet: Heart Healthy  Low Carb  Special Instructions: If you have smoked or chewed Tobacco  in the last 2 yrs please stop smoking, stop any regular Alcohol  and or any Recreational drug use.  On your next visit with your primary care physician please Get Medicines reviewed and adjusted.  Please request your Prim.MD to go over all Hospital Tests and Procedure/Radiological results at the follow up, please get all Hospital records sent to your Prim MD by signing hospital release before you go home.  If you experience worsening of your admission symptoms, develop shortness of breath, life threatening emergency, suicidal or homicidal thoughts you must seek medical attention immediately by calling 911 or calling your MD immediately  if symptoms less severe.  You Must read complete instructions/literature along with all the possible adverse reactions/side effects for all the Medicines you take and that have been prescribed to you. Take any new Medicines after you have completely understood and accpet all the possible adverse reactions/side effects.   Increase activity slowly   Complete by: As directed        Discharge Medications   Allergies as of 10/10/2020       Reactions   Latex Other (See Comments)   Reddens the skin   Ace Inhibitors Cough   Codeine Nausea Only, Rash      Compazine [prochlorperazine] Anxiety   Penicillins Rash   Childhood allergy Has patient had a PCN reaction causing immediate rash, facial/tongue/throat swelling, SOB or lightheadedness with hypotension:  Yes Has patient had a PCN reaction causing severe rash involving mucus membranes or skin necrosis: Yes Has patient had a PCN reaction that required hospitalization No Has patient had a PCN reaction occurring within the last 10 years: No If all of the above answers are "NO", then may proceed with Cephalosporin use.        Medication List     STOP taking these medications    ibuprofen 200 MG tablet Commonly known as: ADVIL   metoprolol succinate 25 MG 24 hr tablet Commonly  known as: TOPROL-XL       TAKE these medications    Accu-Chek Aviva Plus test strip Generic drug: glucose blood Test 2 times a day.   acetaminophen 500 MG tablet Commonly known as: TYLENOL Take 1,000 mg by mouth every 6 (six) hours as needed for moderate pain.   atorvastatin 40 MG tablet Commonly known as: LIPITOR TAKE 1 TABLET BY MOUTH EVERY DAY   chlorhexidine 0.12 % solution Commonly known as: PERIDEX Use as directed 15 mLs in the mouth or throat 2 (two) times daily as needed (mouth pain).   dexamethasone 6 MG tablet Commonly known as: DECADRON Take 1 tablet (6 mg total) by mouth 2 (two) times daily with a meal.   doxycycline 100 MG tablet Commonly known as: VIBRA-TABS Take 1 tablet (100 mg total) by mouth every 12 (twelve) hours.   escitalopram 10 MG tablet Commonly known as: LEXAPRO TAKE 1/2 TABLET ONCE DAILY FOR ONE WEEK AND THEN INCREASE TO ONE DAILY. What changed:  how much to take how to take this when to take this additional instructions   furosemide 40 MG tablet Commonly known as: LASIX Take 40 mg by mouth daily.   gabapentin 300 MG capsule Commonly known as: NEURONTIN TAKE 2 CAPSULES BY MOUTH 3 TIMES A DAY What changed: when to take this   levothyroxine 25 MCG tablet Commonly known as: SYNTHROID TAKE 1 TABLET BY MOUTH EVERY DAY   metFORMIN 500 MG tablet Commonly known as: GLUCOPHAGE Take 1 tablet (500 mg total) by mouth 2 (two) times daily with a meal. What  changed: when to take this   nitroGLYCERIN 0.4 MG SL tablet Commonly known as: NITROSTAT Place 1 tablet (0.4 mg total) under the tongue every 5 (five) minutes as needed. Chest pain What changed: reasons to take this   ondansetron 4 MG tablet Commonly known as: Zofran Take 1 tablet (4 mg total) by mouth every 8 (eight) hours as needed for nausea.   pantoprazole 40 MG tablet Commonly known as: PROTONIX TAKE 1 TABLET BY MOUTH EVERY DAY   polyethylene glycol 17 g packet Commonly known as: MIRALAX / GLYCOLAX Take 17 g by mouth daily as needed for mild constipation.   potassium chloride SA 20 MEQ tablet Commonly known as: Klor-Con M20 Take 1 tablet (20 mEq total) by mouth daily.   pramipexole 1.5 MG tablet Commonly known as: MIRAPEX TAKE 2 TABLETS BY MOUTH EVERY EVENING What changed: when to take this   vitamin B-12 1000 MCG tablet Commonly known as: CYANOCOBALAMIN Take 1,000 mcg by mouth daily.   warfarin 5 MG tablet Commonly known as: COUMADIN Take as directed. If you are unsure how to take this medication, talk to your nurse or doctor. Original instructions: Take 1 tablet daily or Take as directed by anticoagulation clinic What changed:  how much to take how to take this when to take this additional instructions               Durable Medical Equipment  (From admission, onward)           Start     Ordered   10/09/20 1047  For home use only DME Hospital bed  Once       Question Answer Comment  Length of Need 6 Months   Patient has (list medical condition): weakness   The above medical condition requires: Patient requires the ability to reposition frequently   Bed type Semi-electric      10/09/20 1047   10/09/20  1004  For home use only DME lightweight manual wheelchair with seat cushion  Once       Comments: Patient suffers from generalized weakness which impairs their ability to perform daily activities like ambulating in the home.  A cane will not  resolve  issue with performing activities of daily living. A wheelchair will allow patient to safely perform daily activities. Patient is not able to propel themselves in the home using a standard weight wheelchair due to generalized weakness. Patient can self propel in the lightweight wheelchair. Length of need Lifetime. Accessories: elevating leg rests (ELRs), wheel locks, extensions and anti-tippers.   10/09/20 1006   10/06/20 1620  For home use only DME wheelchair cushion (seat and back)  Once        10/06/20 1620             Follow-up Information     [name], Alinda Sierras, MD. Schedule an appointment as soon as possible for a visit in 1 week(s).   Specialty: Family Medicine Contact information: [place] [address] [phone]         Burnell Blanks, MD .   Specialty: Cardiology Contact information: [address] [place] [address] [phone]         Volanda Napoleon, MD. Schedule an appointment as soon as possible for a visit in 1 week(s).   Specialty: Oncology Contact information: [address] [place] [address] [phone]                 Major procedures and Radiology Reports - PLEASE review detailed and final reports thoroughly  -       CT ABDOMEN PELVIS WO CONTRAST  Result Date: 09/28/2020 CLINICAL DATA:  Nausea, vomiting, emesis EXAM: CT ABDOMEN AND PELVIS WITHOUT CONTRAST TECHNIQUE: Multidetector CT imaging of the abdomen and pelvis was performed following the standard protocol without IV contrast. COMPARISON:  07/23/2019 FINDINGS: Lower chest: No acute abnormality. Cardiomegaly. Coronary artery calcifications. Hepatobiliary: No focal liver abnormality is seen. Status post cholecystectomy. No biliary dilatation. Pancreas: Unremarkable. No pancreatic ductal dilatation or surrounding inflammatory changes. Spleen: Normal in size without significant abnormality. Adrenals/Urinary  Tract: Adrenal glands are unremarkable. Nine bilateral renal cysts, including a high attenuation hemorrhagic or proteinaceous cyst of the posterior midportion of the right kidney. Kidneys are otherwise normal, without renal calculi, solid lesion, or hydronephrosis. Suprapubic catheter. Stomach/Bowel: Stomach is within normal limits. Appendix appears normal. No evidence of bowel wall thickening, distention, or inflammatory changes. Vascular/Lymphatic: Aortic atherosclerosis. No enlarged abdominal or pelvic lymph nodes. Reproductive: Prostatomegaly. Other: No abdominal wall hernia or abnormality. No abdominopelvic ascites. Musculoskeletal: No acute or significant osseous findings. IMPRESSION: 1. No acute noncontrast CT findings of the abdomen or pelvis to explain nausea or vomiting. 2. Suprapubic catheter. 3. Prostatomegaly. 4. Coronary artery disease. Aortic Atherosclerosis (ICD10-I70.0). Electronically Signed   By: Eddie Candle M.D.   On: 09/28/2020 11:14   CT HEAD WO CONTRAST (5MM)  Result Date: 10/02/2020 CLINICAL DATA:  Nystagmus, possible lymphoma of brain EXAM: CT HEAD WITHOUT CONTRAST TECHNIQUE: Contiguous axial images were obtained from the base of the skull through the vertex without intravenous contrast. COMPARISON:  09/24/2020, 09/29/2020 FINDINGS: Brain: No acute infarct or hemorrhage. Lateral ventricles and midline structures are stable. 8 mm hyperdense area in the region of the cerebral aqueduct just inferior to the calcified pineal gland corresponds to the abnormal area of enhancement on recent MRI. No acute extra-axial fluid  collections. No mass effect. Vascular: Stable atherosclerosis.  No hyperdense vessel. Skull: Normal. Negative for fracture or focal lesion. Sinuses/Orbits: No acute finding. Other: None. IMPRESSION: 1. No acute infarct or hemorrhage. 2. Subtle hyperdense area in the region of the cerebral aqueduct corresponds to the enhancing lesion seen on recent MRI, suspicious for  malignancy. Electronically Signed   By: Randa Ngo M.D.   On: 10/02/2020 22:36   CT Head Wo Contrast  Result Date: 09/24/2020 CLINICAL DATA:  Headache.  Double vision. EXAM: CT HEAD WITHOUT CONTRAST TECHNIQUE: Contiguous axial images were obtained from the base of the skull through the vertex without intravenous contrast. COMPARISON:  CT head 07/23/2019. FINDINGS: Brain: No evidence of acute infarction, hemorrhage, hydrocephalus, extra-axial collection or mass lesion/mass effect. Again seen is mild diffuse atrophy, unchanged. There is stable mild patchy periventricular and deep white matter hypodensity, likely chronic small vessel ischemic change. Small calcified meningioma measuring 7 mm overlying the right tentorium appears unchanged from the prior exam. Vascular: Atherosclerotic calcifications are present within the cavernous internal carotid arteries. Skull: Normal. Negative for fracture or focal lesion. Sinuses/Orbits: No acute finding. Other: None. IMPRESSION: No acute intracranial abnormality. Electronically Signed   By: Ronney Asters M.D.   On: 09/24/2020 21:07   CT CHEST WO CONTRAST  Result Date: 10/04/2020 CLINICAL DATA:  87 year old male with history of recurrent non-Hodgkin's lymphoma. Staging examination. EXAM: CT CHEST WITHOUT CONTRAST TECHNIQUE: Multidetector CT imaging of the chest was performed following the standard protocol without IV contrast. COMPARISON:  CT the chest, abdomen and pelvis 07/23/2019. FINDINGS: Cardiovascular: Heart size is normal. There is no significant pericardial fluid, thickening or pericardial calcification. There is aortic atherosclerosis, as well as atherosclerosis of the great vessels of the mediastinum and the coronary arteries, including calcified atherosclerotic plaque in the left main, left anterior descending, left circumflex and right coronary arteries. Calcifications of the aortic valve and mild calcifications of the mitral annulus. Right internal  jugular single-lumen porta cath with tip terminating in the superior aspect of the right atrium. Mediastinum/Nodes: No pathologically enlarged mediastinal or hilar lymph nodes. Please note that accurate exclusion of hilar adenopathy is limited on noncontrast CT scans. Esophagus is unremarkable in appearance. No axillary lymphadenopathy. Lungs/Pleura: No suspicious appearing pulmonary nodules or masses are noted. No acute consolidative airspace disease. No pleural effusions. Areas of chronic post infectious or inflammatory scarring are noted in the lung bases bilaterally (left greater than right). Upper Abdomen: Aortic atherosclerosis. 4.3 cm low-attenuation lesion in the anterior aspect of the interpolar region of the left kidney, incompletely characterized on today's non-contrast CT examination, but previously characterized as a simple cyst. Status post cholecystectomy. Musculoskeletal: There are no aggressive appearing lytic or blastic lesions noted in the visualized portions of the skeleton. IMPRESSION: 1. No lymphadenopathy or other findings to suggest recurrent lymphoma in the thorax. 2. No acute findings. 3. Aortic atherosclerosis, in addition to left main and 3 vessel coronary artery disease. 4. There are calcifications of the aortic valve and mitral annulus. Echocardiographic correlation for evaluation of potential valvular dysfunction may be warranted if clinically indicated. Aortic Atherosclerosis (ICD10-I70.0). Electronically Signed   By: Vinnie Langton M.D.   On: 10/04/2020 09:07   MR BRAIN WO CONTRAST  Result Date: 09/28/2020 CLINICAL DATA:  Stroke, follow up EXAM: MRI HEAD WITHOUT CONTRAST TECHNIQUE: Multiplanar, multiecho pulse sequences of the brain and surrounding structures were obtained without intravenous contrast. COMPARISON:  Most recent MRI is from 2013 FINDINGS: Brain: There is no acute infarction or intracranial  hemorrhage. There is no intracranial mass or significant mass effect.  There is no hydrocephalus or extra-axial fluid collection. There is abnormal T2 hyperintensity involving the dorsal midbrain, pons, and medulla. Possible slight effacement of cerebral aqueduct focally. Additional patchy and confluent areas of T2 hyperintensity in the supratentorial white matter are nonspecific but probably reflect chronic microvascular ischemic changes. Prominence of the ventricles and sulci reflects parenchymal volume loss. Vascular: Major vessel flow voids at the skull base are preserved. Skull and upper cervical spine: Normal marrow signal is preserved. Sinuses/Orbits: Minor mucosal thickening. Bilateral lens replacements. Other: Sella is unremarkable.  Mastoid air cells are clear. IMPRESSION: Abnormal signal involving the dorsal brainstem with possible minor mass effect. The location accounts for reported intractable vomiting. Differential is wide and includes toxic/metabolic, infectious/post infectious, demyelinating/inflammatory, and neoplastic etiologies. Postcontrast imaging is recommended. Electronically Signed   By: Macy Mis M.D.   On: 09/28/2020 15:09   MR BRAIN W CONTRAST  Result Date: 09/29/2020 CLINICAL DATA:  Follow-up from prior exam obtained 1 day ago EXAM: MRI HEAD WITH CONTRAST TECHNIQUE: Multiplanar, multiecho pulse sequences of the brain and surrounding structures were obtained with intravenous contrast. CONTRAST:  74mL GADAVIST GADOBUTROL 1 MMOL/ML IV SOLN COMPARISON:  Noncontrast brain MRI obtained 1 day prior FINDINGS: Brain: There is focal masslike enhancement in the region of the cerebral aqueduct measuring 0.9 cm AP by 0.9 cm TV by 0.9 cm cc corresponding to T2/FLAIR signal abnormality seen on the prior study. There is separate nodular enhancement along the anterior aspect of the fourth ventricle more inferiorly. There is nodular appearing enhancement of the cranial nerve 7/8 nerve complexes in the bilateral IAC's. There is abnormal enhancement of the right fifth  cranial nerve. There is a homogeneously enhancing lesion along the lateral aspect of the right tentorial leaflet measuring up to 0.8 cm by 0.5 cm corresponding to a calcified lesion present on prior head CTs, most likely reflecting a meningioma. Vascular: The major blood vessels enhance normally. Skull and upper cervical spine: Normal marrow signal. Sinuses/Orbits: Grossly unremarkable. Other: None. IMPRESSION: Masslike enhancement in the region of the cerebral aqueduct measuring up to 0.9 cm, and nodular enhancement along the ventral aspect of fourth ventricle and multiple cranial nerves as above. 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 Recommend correlation with lumbar puncture. Electronically Signed   By: Valetta Mole M.D.   On: 09/29/2020 11:53   DG CHEST PORT 1 VIEW  Result Date: 10/01/2020 CLINICAL DATA:  Shortness of breath EXAM: PORTABLE CHEST 1 VIEW COMPARISON:  August 07, 2019 FINDINGS: The cardiomediastinal silhouette is unchanged in contour.RIGHT chest port tip terminating over the superior cavoatrial junction. RIGHT-sided skinfold. No pleural effusion. No pneumothorax. Unchanged LEFT basilar atelectasis/scar. No acute pleuroparenchymal abnormality. Visualized abdomen is unremarkable. Multilevel degenerative changes of the thoracic spine. IMPRESSION: LEFT basilar atelectasis Electronically Signed   By: Valentino Saxon M.D.   On: 10/01/2020 11:46     Today   Subjective    Gary Yates today has no headache,no chest abdominal pain,no new weakness tingling or numbness, feels much better wants to go home today.     Objective   Blood pressure 122/65, pulse 80, temperature 98.8 F (37.1 C), temperature source Oral, resp. rate 18, height 5\' 10"  (1.778 m), weight 90.6 kg, SpO2 98 %.   Intake/Output Summary (Last 24 hours) at 10/10/2020 0955 Last data filed at 10/10/2020 9983 Gross per 24 hour  Intake --  Output 3000 ml  Net -3000 ml  Exam  Awake Alert,  No new F.N deficits, Normal affect [place].AT,PERRAL Supple Neck,No JVD, No cervical lymphadenopathy appriciated.  Symmetrical Chest wall movement, Good air movement bilaterally, CTAB RRR,No Gallops,Rubs or new Murmurs, No Parasternal Heave +ve B.Sounds, Abd Soft, Non tender, No organomegaly appriciated, No rebound -guarding or rigidity. No Cyanosis, Clubbing or edema, No new Rash or bruise   Data Review   CBC w Diff:  Lab Results  Component Value Date   WBC 7.3 10/10/2020   HGB 9.6 (L) 10/10/2020   HGB 10.3 (L) 09/14/2020   HGB 10.5 (L) 01/10/2017   HCT 29.8 (L) 10/10/2020   HCT 31.5 (L) 01/10/2017   PLT 156 10/10/2020   PLT 155 09/14/2020   PLT 123 (L) 01/10/2017   LYMPHOPCT 12 10/10/2020   LYMPHOPCT 20.3 01/10/2017   BANDSPCT 0 09/19/2017   MONOPCT 5 10/10/2020   MONOPCT 11.9 01/10/2017   EOSPCT 0 10/10/2020   EOSPCT 3.0 01/10/2017   BASOPCT 0 10/10/2020   BASOPCT 0.5 01/10/2017    CMP:  Lab Results  Component Value Date   NA 128 (L) 10/10/2020   NA 137 08/18/2019   NA 141 01/10/2017   NA 137 12/14/2015   K 3.7 10/10/2020   K 4.4 01/10/2017   K 3.7 12/14/2015   CL 96 (L) 10/10/2020   CL 104 01/10/2017   CO2 21 (L) 10/10/2020   CO2 27 01/10/2017   CO2 24 12/14/2015   BUN 51 (H) 10/10/2020   BUN 29 (A) 08/18/2019   BUN 19 01/10/2017   BUN 18.4 12/14/2015   CREATININE 1.62 (H) 10/10/2020   CREATININE 1.98 (H) 09/14/2020   CREATININE 1.76 (H) 10/08/2019   CREATININE 0.9 12/14/2015   GLU 122 08/18/2019   PROT 5.2 (L) 10/10/2020   PROT 6.5 01/10/2017   PROT 6.6 12/14/2015   ALBUMIN 2.6 (L) 10/10/2020   ALBUMIN 3.4 01/10/2017   ALBUMIN 3.4 (L) 12/14/2015   BILITOT 0.4 10/10/2020   BILITOT 0.4 09/14/2020   BILITOT 0.58 12/14/2015   ALKPHOS 79 10/10/2020   ALKPHOS 112 (H) 01/10/2017   ALKPHOS 102 12/14/2015   AST 22 10/10/2020   AST 14 (L) 09/14/2020   AST 22 12/14/2015   ALT 20 10/10/2020   ALT 5 09/14/2020   ALT 18 01/10/2017   ALT 10 12/14/2015   .  Lab Results  Component Value Date   INR 2.1 (H) 10/10/2020   INR 2.2 (H) 10/09/2020   INR 1.8 (H) 10/08/2020   PROTIME 43.2 (H) 10/10/2016   PROTIME 32.4 (H) 08/23/2016   PROTIME 51.6 (H) 07/26/2016    Total Time in preparing paper work, data evaluation and todays exam - 54 minutes  Lala Lund M.D on 10/10/2020 at 9:55 AM  [facility]

## 2020-10-10 NOTE — Progress Notes (Signed)
Nutrition Follow-up  DOCUMENTATION CODES:   Not applicable  INTERVENTION:   -Continue Boost Breeze po TID, each supplement provides 250 kcal and 9 grams of protein  -Continue 30 ml Prosource Plus TID, each supplement provides 100 kcals and 15 grams protein -Continue Magic cup TID with meals, each supplement provides 290 kcal and 9 grams of protein  -Continue MVI with minerals daily  NUTRITION DIAGNOSIS:   Predicted suboptimal nutrient intake related to nausea, vomiting as evidenced by per patient/family report.  Ongoing  GOAL:   Patient will meet greater than or equal to 90% of their needs  Progressing   MONITOR:   PO intake, Supplement acceptance, Diet advancement, Weight trends, Skin, I & O's  REASON FOR ASSESSMENT:   Consult Assessment of nutrition requirement/status, Poor PO  ASSESSMENT:   87 year old male with history of relapsed non-Hodgkin's lymphoma of the testes in remission who presents with occipital headache, intractable nausea and vomiting, right sided nystagmus with findings of abnormal signal seen on dorsal brainstem on MRI.  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   Pt with improved oral intake. Noted meal completions 25-75%. Pt is taking Prosource and Boost Breeze supplements.   Per TOC notes, plan to discharge home today.   Medications reviewed and include thiamine and vitamin B-12.   Labs reviewed: Na: 128, CBGS: 173-251 (inpatient orders for glycemic control are 0-5 units insulin  aspart daily at bedtime and 0-9 units insulin aspart TID with meals).    Diet Order:   Diet Order             Diet - low sodium heart healthy           DIET SOFT Room service appropriate? Yes; Fluid consistency: Thin  Diet effective now                   EDUCATION NEEDS:   No education needs have been identified at this time  Skin:  Skin Assessment: Reviewed RN Assessment  Last BM:  10/02/20  Height:   Ht Readings from Last 1 Encounters:  09/29/20 5\' 10"  (1.778 m)    Weight:   Wt Readings from Last 1 Encounters:  10/07/20 90.6 kg    Ideal Body Weight:  75.5 kg  BMI:  Body mass index is 28.66 kg/m.  Estimated Nutritional Needs:   Kcal:  0300-9233  Protein:  105-120 grams  Fluid:  > 2 L    Loistine Chance, RD, LDN, [place] Registered Dietitian II Certified Diabetes Care and Education Specialist Please refer to [facility] for RD and/or RD on-call/weekend/after hours pager

## 2020-10-10 NOTE — Progress Notes (Signed)
Occupational Therapy Treatment Patient Details Name: Gary Yates. MRN: [id number] DOB: 05-Apr-1933 Today's Date: 10/10/2020   History of present illness [name] who presents on 09/28/20 with several week hx of dizziness, headache, N/V. 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 MRI brain with contrast: Masslike enhancement of the cerebral aqueduct-nodular enhancement along the ventricle and multiple cranial nerves-suspicious for leptomeningeal spread of malignancy. s/p LP with cytology/flow cytometry on 9/27. PMH includes A-fib, COPD, CAD, non-Hodgkin's lymphoma in remission, HTN, OSA, DM.   OT comments  Pt progressing towards acute OT goals. Pt completed bed mobility, UB and LB dressing with up to mod A. Daughter present at start and end of session, provided education on energy conservation. D/c plan remains appropriate.     Recommendations for follow up therapy are one component of a multi-disciplinary discharge planning process, led by the attending physician.  Recommendations may be updated based on patient status, additional functional criteria and insurance authorization.    Follow Up Recommendations  Home health OT    Equipment Recommendations       Recommendations for Other Services      Precautions / Restrictions Precautions Precautions: Fall;Other (comment) Restrictions Weight Bearing Restrictions: No       Mobility Bed Mobility Overal bed mobility: Needs Assistance Bed Mobility: Supine to Sit;Sit to Supine     Supine to sit: Mod assist Sit to supine: Min assist   General bed mobility comments: Assist with trunk elevation    Transfers Overall transfer level: Needs assistance Equipment used: Rolling walker (2 wheeled) Transfers: Sit to/from Stand Sit to Stand: Mod assist;From elevated surface         General transfer comment:  EOB elevated mod A to boost and steady. Assist to control descent. Assist to stabilize rw during sit<>stand transitions as pt uses BUE to pull on rw.    Balance Overall balance assessment: Needs assistance Sitting-balance support: Feet supported;Bilateral upper extremity supported Sitting balance-Leahy Scale: Poor Sitting balance - Comments: reliant on single-bilateral UE support Postural control: Posterior lean Standing balance support: Bilateral upper extremity supported;During functional activity Standing balance-Leahy Scale: Poor Standing balance comment: needs BUE support in static standing                           ADL either performed or assessed with clinical judgement   ADL Overall ADL's : Needs assistance/impaired                 Upper Body Dressing : Minimal assistance;Sitting   Lower Body Dressing: Moderate assistance;Sit to/from stand Lower Body Dressing Details (indicate cue type and reason): Pt able to support self with rw, total assist for clothing management tasks in standing               General ADL Comments: Pt completed bed mobility, UB and LB dressing. Daughter present at start and end of session. Education on energy conservation shared with daughter.     Vision       Perception     Praxis      Cognition Arousal/Alertness: Awake/alert Behavior During Therapy: WFL for tasks assessed/performed Overall Cognitive Status: Within Functional Limits for tasks assessed  Exercises     Shoulder Instructions       General Comments      Pertinent Vitals/ Pain       Pain Assessment: No/denies pain  Home Living                                          Prior Functioning/Environment              Frequency  Min 2X/week        Progress Toward Goals  OT Goals(current goals can now be found in the care plan section)  Progress towards OT goals:  Progressing toward goals  Acute Rehab OT Goals Patient Stated Goal: to go home OT Goal Formulation: With patient Time For Goal Achievement: 10/16/20 Potential to Achieve Goals: Good ADL Goals Pt Will Perform Grooming: with modified independence;standing Pt Will Perform Upper Body Bathing: with modified independence;sitting Pt Will Perform Lower Body Bathing: with modified independence;sit to/from stand Pt Will Perform Upper Body Dressing: with modified independence;sitting Pt Will Perform Lower Body Dressing: with modified independence;with adaptive equipment;sit to/from stand Pt Will Transfer to Toilet: with modified independence;ambulating Pt Will Perform Toileting - Clothing Manipulation and hygiene: with modified independence;sit to/from stand  Plan Discharge plan remains appropriate    Co-evaluation                 AM-PAC OT "6 Clicks" Daily Activity     Outcome Measure   Help from another person eating meals?: None Help from another person taking care of personal grooming?: A Little Help from another person toileting, which includes using toliet, bedpan, or urinal?: A Lot Help from another person bathing (including washing, rinsing, drying)?: A Lot Help from another person to put on and taking off regular upper body clothing?: A Little Help from another person to put on and taking off regular lower body clothing?: A Lot 6 Click Score: 16    End of Session Equipment Utilized During Treatment: Gait belt;Rolling walker  OT Visit Diagnosis: Unsteadiness on feet (R26.81);Repeated falls (R29.6);History of falling (Z91.81)   Activity Tolerance Patient tolerated treatment well   Patient Left in bed;with call bell/phone within reach;with family/visitor present;with nursing/sitter in room;Other (comment) (awaiting EMS transport home)   Nurse Communication Other (comment) (RN present for most of session)        Time: [phone] OT Time Calculation (min): 34  min  Charges: OT General Charges $OT Visit: 1 Visit OT Treatments $Self Care/Home Management : 23-37 mins  Tyrone Schimke, OT Acute Rehabilitation Services Pager: [phone] Office: [phone]   Gary Yates 10/10/2020, 10:57 AM

## 2020-10-11 ENCOUNTER — Encounter: Payer: Self-pay | Admitting: Radiation Oncology

## 2020-10-11 ENCOUNTER — Ambulatory Visit: Payer: Medicare Other

## 2020-10-11 ENCOUNTER — Ambulatory Visit
Admission: RE | Admit: 2020-10-11 | Discharge: 2020-10-11 | Disposition: A | Discharge status: Home / Self Care | Payer: Medicare Other | Source: Ambulatory Visit | Attending: Radiation Oncology | Admitting: Radiation Oncology

## 2020-10-11 ENCOUNTER — Ambulatory Visit
Admission: RE | Admit: 2020-10-11 | Discharge: 2020-10-11 | Disposition: A | Discharge status: Home / Self Care | Payer: Medicare Other | Source: Ambulatory Visit | Admitting: Radiation Oncology

## 2020-10-11 ENCOUNTER — Other Ambulatory Visit: Payer: Self-pay

## 2020-10-11 DIAGNOSIS — C8589 Other specified types of non-Hodgkin lymphoma, extranodal and solid organ sites: Secondary | ICD-10-CM | POA: Diagnosis not present

## 2020-10-11 DIAGNOSIS — N183 Chronic kidney disease, stage 3 unspecified: Secondary | ICD-10-CM | POA: Insufficient documentation

## 2020-10-11 DIAGNOSIS — G629 Polyneuropathy, unspecified: Secondary | ICD-10-CM | POA: Insufficient documentation

## 2020-10-11 DIAGNOSIS — E119 Type 2 diabetes mellitus without complications: Secondary | ICD-10-CM | POA: Diagnosis not present

## 2020-10-11 DIAGNOSIS — F419 Anxiety disorder, unspecified: Secondary | ICD-10-CM | POA: Insufficient documentation

## 2020-10-11 DIAGNOSIS — Z923 Personal history of irradiation: Secondary | ICD-10-CM | POA: Insufficient documentation

## 2020-10-11 DIAGNOSIS — E1122 Type 2 diabetes mellitus with diabetic chronic kidney disease: Secondary | ICD-10-CM | POA: Insufficient documentation

## 2020-10-11 DIAGNOSIS — J449 Chronic obstructive pulmonary disease, unspecified: Secondary | ICD-10-CM | POA: Diagnosis not present

## 2020-10-11 DIAGNOSIS — Z79899 Other long term (current) drug therapy: Secondary | ICD-10-CM | POA: Diagnosis not present

## 2020-10-11 DIAGNOSIS — Z87891 Personal history of nicotine dependence: Secondary | ICD-10-CM | POA: Insufficient documentation

## 2020-10-11 DIAGNOSIS — Z7901 Long term (current) use of anticoagulants: Secondary | ICD-10-CM | POA: Insufficient documentation

## 2020-10-11 DIAGNOSIS — E1140 Type 2 diabetes mellitus with diabetic neuropathy, unspecified: Secondary | ICD-10-CM | POA: Diagnosis not present

## 2020-10-11 DIAGNOSIS — E785 Hyperlipidemia, unspecified: Secondary | ICD-10-CM | POA: Diagnosis not present

## 2020-10-11 DIAGNOSIS — I1310 Hypertensive heart and chronic kidney disease without heart failure, with stage 1 through stage 4 chronic kidney disease, or unspecified chronic kidney disease: Secondary | ICD-10-CM | POA: Insufficient documentation

## 2020-10-11 DIAGNOSIS — E1136 Type 2 diabetes mellitus with diabetic cataract: Secondary | ICD-10-CM | POA: Insufficient documentation

## 2020-10-11 DIAGNOSIS — C7931 Secondary malignant neoplasm of brain: Secondary | ICD-10-CM

## 2020-10-11 DIAGNOSIS — K219 Gastro-esophageal reflux disease without esophagitis: Secondary | ICD-10-CM | POA: Diagnosis not present

## 2020-10-11 DIAGNOSIS — I471 Supraventricular tachycardia: Secondary | ICD-10-CM | POA: Insufficient documentation

## 2020-10-11 DIAGNOSIS — C8336 Diffuse large B-cell lymphoma, intrapelvic lymph nodes: Secondary | ICD-10-CM | POA: Insufficient documentation

## 2020-10-11 DIAGNOSIS — I3481 Nonrheumatic mitral (valve) annulus calcification: Secondary | ICD-10-CM | POA: Insufficient documentation

## 2020-10-11 DIAGNOSIS — I700 Atherosclerosis of aorta: Secondary | ICD-10-CM | POA: Diagnosis not present

## 2020-10-11 DIAGNOSIS — I2510 Atherosclerotic heart disease of native coronary artery without angina pectoris: Secondary | ICD-10-CM | POA: Diagnosis not present

## 2020-10-11 DIAGNOSIS — Z87442 Personal history of urinary calculi: Secondary | ICD-10-CM | POA: Insufficient documentation

## 2020-10-11 DIAGNOSIS — N390 Urinary tract infection, site not specified: Secondary | ICD-10-CM | POA: Diagnosis not present

## 2020-10-11 DIAGNOSIS — Z794 Long term (current) use of insulin: Secondary | ICD-10-CM | POA: Insufficient documentation

## 2020-10-11 DIAGNOSIS — D509 Iron deficiency anemia, unspecified: Secondary | ICD-10-CM | POA: Diagnosis not present

## 2020-10-11 DIAGNOSIS — R112 Nausea with vomiting, unspecified: Secondary | ICD-10-CM | POA: Diagnosis not present

## 2020-10-11 DIAGNOSIS — N281 Cyst of kidney, acquired: Secondary | ICD-10-CM | POA: Diagnosis not present

## 2020-10-11 DIAGNOSIS — Z7952 Long term (current) use of systemic steroids: Secondary | ICD-10-CM | POA: Insufficient documentation

## 2020-10-11 DIAGNOSIS — G939 Disorder of brain, unspecified: Secondary | ICD-10-CM | POA: Insufficient documentation

## 2020-10-11 DIAGNOSIS — Z9221 Personal history of antineoplastic chemotherapy: Secondary | ICD-10-CM | POA: Diagnosis not present

## 2020-10-11 DIAGNOSIS — Z510 Encounter for antineoplastic radiation therapy: Secondary | ICD-10-CM | POA: Insufficient documentation

## 2020-10-11 DIAGNOSIS — D631 Anemia in chronic kidney disease: Secondary | ICD-10-CM | POA: Diagnosis not present

## 2020-10-11 DIAGNOSIS — I4891 Unspecified atrial fibrillation: Secondary | ICD-10-CM | POA: Diagnosis not present

## 2020-10-11 NOTE — Progress Notes (Signed)
See MD note for nursing evaluation. °

## 2020-10-11 NOTE — Progress Notes (Signed)
Radiation Oncology         [phone]) [phone] ________________________________  Follow-up New Visit  Name: Gary Yates. MRN: [id number]  Date: 10/11/2020  DOB: 06-Dec-1933  EX:[id number], Alinda Sierras, MD  Volanda Napoleon, MD   REFERRING PHYSICIAN: Volanda Napoleon, MD  DIAGNOSIS: The primary encounter diagnosis was Diffuse non-Hodgkin's lymphoma of testis [facility]). A diagnosis of Metastasis to brain [facility]) was also pertinent to this visit.  History of relapsed non-Hodgkin's lymphoma that has been in remission for several years ; now with new enhancing brain lesion suspicious for malignancy  (History of stage 3 chronic kidney disease)  HISTORY OF PRESENT ILLNESS::Gary Yates. is a 87 y.o. 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 He returns today for re-evaluation following spinal tap which revealed findings suggestive of lymphoma (patient was last seen on 10/04/20).  The patient presented to the ED on 09/28/20 with a 3 week history of occipital headache with neck pain as well as nausea and vomiting.  He stated that the headache and nausea and vomiting is worse when he first wakes up and also after meals. He normally ambulates with a walker and has a shuffling gait but his daughter stated it has been worse in the past several weeks.  He has had several falls due to unsteady gait in the past month and reported that he hit his head on a cabinet several weeks ago. UA taken during ED course was negative. (The patient has been followed by Dr. Marin Olp. During follow-up with Dr. Marin Olp on 09/14/20, the patient was noted to have lost quite a bit of weight from not eating, patient also reported nausea and headaches at this time).   MRI of the brain taken during hospital course on 09/29/20 demonstrated a mass-like enhancement in the region of the cerebral aqueduct measuring up to 0.9 cm, and nodular enhancement along the  ventral aspect of fourth ventricle and multiple cranial nerves. These findings were noted as most suspicious for leptomeningeal spread of Malignancy.  CT of the head without contrast on 10/02/20 further demonstrated the subtle hyperdense area in the region of the cerebral aqueduct; corresponding to the enhancing lesion seen on MRI (again noted as suspicious for malignancy).   Cerebral spinal tap performed on 10/03/20 revealed no malignant cells identified.  Chest CT taken on 10/04/20 revealed no lymphadenopathy or other findings to suggest recurrent lymphoma in the thorax. Overall, no acute findings were visualized.   Cerebral spinal tap performed on 10/05/20 revealed atypical cells present. Diagnostic comment indicated numerous lymphoid cells present; indicating overall findings as suggestive of lymphoma.  The patients case was discussed in the brain and spine [place] on 10/09/20. Concluded recommendation was for the patient to undergo whole brain radiation and to get spinal MRI imaging to r/o leptomeningeal involvement of the spine. Patient was noted to not be a candidate for cranial spinal radiation.  (Patient was discharged from the hospital on 10/09/20).   Of note: Patient previously was evaluated in the ED on 9/18 for at least 2 weeks of nausea, vomiting and headache.  He had no leukocytosis and stable hemoglobin.  CT head was negative for acute findings.  He was treated with antiemetics and IV fluid resuscitation with improvement and was discharged. Prior to this, he was diagnosed with UTI with oncology on 09/14/20.  PREVIOUS RADIATION THERAPY: Yes (treated by myself)  Radiation treatment dates:  08/07/2016 to 08/20/2016 Site/dose:   The left adrenal gland mass was treated to 30 Gy in 10 fractions at 3 Gy per fraction. Beams/energy:   3-D // 10X, 15X  Radiation treatment dates:   02/19/16 - 03/13/16 Site/dose:   Testis/Scrotum: 32.4 Gy in 18 fractions Beams/energy:   3D // 15X, 6X Photon  The  patient was discharged home yesterday.  He has been doing well since discharge.  He presents today for CT simulation along with 3 family members.  We discussed the radiologic findings as it relates to his clinical presentation.  We also discussed findings from the multidisciplinary brain tumor conference earlier in the week.  We discussed including no further radiation therapy consideration for palliative treatment.  We also discussed treatment to the brain and associated meninges down to the C2 level.  After careful evaluation the patient and his family would like to proceed with radiation therapy as discussed.   PAST MEDICAL HISTORY:  Past Medical History:  Diagnosis Date   Anemia in chronic renal disease 05/07/2017   Anxiety    Atrial fibrillation (HCC)    COPD (chronic obstructive pulmonary disease) (HCC)    pt. denies   Coronary artery disease    a. h/o Overlapping stents RCA;  b. 06/2011 Cath: patent stents, nonobs dzs, NL EF.   Diabetic peripheral neuropathy (HCC)    Diffuse non-Hodgkin's lymphoma of testis ([place]) 09/28/2015   DM (diabetes mellitus) ([place])    Type 2, peripheral neuropathy.   Dyspnea    with exertion   Dysrhythmia    GERD (gastroesophageal reflux disease)    Headache    History of bronchitis    History of kidney stones    History of radiation therapy 02/19/16 - 03/13/16   Testis/Scrotum: 32.4 Gy in 18 fractions   History of radiation therapy 08/07/16-08/20/16   left adrenal gland mass treated to 30 Gy in 10 fractions   Hyperlipidemia    Hypertension    Iron deficiency anemia due to chronic blood loss 08/08/2017   Low testosterone    Nephrolithiasis    OSA (obstructive sleep apnea) 11/26/2017   Osteoarthritis    shoulder   Restless leg    SVT (supraventricular tachycardia) ([place])    Urinary frequency    Wears partial dentures    upper and lower    PAST SURGICAL HISTORY: Past Surgical History:  Procedure Laterality Date   [place]  01/2013   CATARACT EXTRACTION, BILATERAL     CHOLECYSTECTOMY     COLONOSCOPY     CORONARY ANGIOPLASTY  2004   CYSTOSCOPY N/A 08/18/2017   Procedure: CYSTOSCOPY WITH FULGURATION AND SUPRA PUBIC TUBE PLACEMENT;  Surgeon: Kathie Rhodes, MD;  Location: [facility] ORS;  Service: Urology;  Laterality: N/A;   EYE SURGERY Bilateral    cataracts   IR GENERIC HISTORICAL  10/05/2015   IR US GUIDE VASC ACCESS RIGHT 10/05/2015 Marybelle Killings, MD [facility]-INTER[facility] RAD   IR GENERIC HISTORICAL  10/05/2015   IR FLUORO GUIDE PORT INSERTION RIGHT 10/05/2015 Marybelle Killings, MD [facility]   LEFT HEART CATHETERIZATION WITH CORONARY ANGIOGRAM N/A 06/18/2011   Procedure: LEFT HEART CATHETERIZATION WITH CORONARY ANGIOGRAM;  Surgeon: Peter M Martinique, MD;  Location: [facility] CATH LAB;  Service: Cardiovascular;  Laterality: N/A;   LEFT HEART CATHETERIZATION WITH CORONARY ANGIOGRAM N/A 01/27/2013   Procedure: LEFT HEART CATHETERIZATION WITH CORONARY ANGIOGRAM;  Surgeon: Burnell Blanks, MD;  Location: [facility] CATH  LAB;  Service: Cardiovascular;  Laterality: N/A;   LUMBAR LAMINECTOMY/DECOMPRESSION MICRODISCECTOMY N/A 02/07/2015   Procedure: Lumbar three-Sacral one Decompression;  Surgeon: Kevan Ny Ditty, MD;  Location: [place] NEURO ORS;  Service: Neurosurgery;  Laterality: N/A;  L3 to S1 Decompression   MULTIPLE TOOTH EXTRACTIONS     ORCHIECTOMY Right 09/01/2015   Procedure: RIGHT ORCHIECTOMY;  Surgeon: Kathie Rhodes, MD;  Location: [facility]S;  Service: Urology;  Laterality: Right;   port a cath in place      ROTATOR CUFF REPAIR Left     FAMILY HISTORY:  Family History  Problem Relation Age of Onset   Alzheimer's disease Mother    Heart disease Mother    Heart disease Father    Migraines Father    Ulcers Father    Prostate cancer Brother    Coronary artery disease Other        Male 1st degree relative <50   Coronary artery disease Other        male 1st degree relative <60   Heart disease Sister    Obesity  Sister        Morbid   Arthritis Sister    Heart disease Brother    Arthritis Brother    Sleep apnea Son    Obesity Son    Migraines Daughter    Thyroid disease Daughter     SOCIAL HISTORY:  Social History   Tobacco Use   Smoking status: Former    Packs/day: 1.50    Years: 20.00    Pack years: 30.00    Types: Cigarettes    Quit date: 04/05/1978    Years since quitting: 42.5   Smokeless tobacco: Never  Vaping Use   Vaping Use: Never used  Substance Use Topics   Alcohol use: No   Drug use: No    ALLERGIES:  Allergies  Allergen Reactions   Latex Other (See Comments)    Reddens the skin   Ace Inhibitors Cough   Codeine Nausea Only and Rash        Compazine [Prochlorperazine] Anxiety   Penicillins Rash    Childhood allergy Has patient had a PCN reaction causing immediate rash, facial/tongue/throat swelling, SOB or lightheadedness with hypotension: Yes Has patient had a PCN reaction causing severe rash involving mucus membranes or skin necrosis: Yes Has patient had a PCN reaction that required hospitalization No Has patient had a PCN reaction occurring within the last 10 years: No If all of the above answers are "NO", then may proceed with Cephalosporin use.     MEDICATIONS:  Current Outpatient Medications  Medication Sig Dispense Refill   acetaminophen (TYLENOL) 500 MG tablet Take 1,000 mg by mouth every 6 (six) hours as needed for moderate pain.     atorvastatin (LIPITOR) 40 MG tablet TAKE 1 TABLET BY MOUTH EVERY DAY (Patient taking differently: Take 40 mg by mouth daily.) 90 tablet 0   chlorhexidine (PERIDEX) 0.12 % solution Use as directed 15 mLs in the mouth or throat 2 (two) times daily as needed (mouth pain).     dexamethasone (DECADRON) 6 MG tablet Take 1 tablet (6 mg total) by mouth daily. 30 tablet 0   furosemide (LASIX) 40 MG tablet Take 40 mg by mouth daily.     gabapentin (NEURONTIN) 300 MG capsule TAKE 2 CAPSULES BY MOUTH 3 TIMES A DAY (Patient taking  differently: Take 600 mg by mouth 2 (two) times daily.) 540 capsule 1   Insulin Pen Needle 32G X 4 MM MISC  Use to inject insulin 3 to 4 times daily. 100 each 0   levothyroxine (SYNTHROID) 25 MCG tablet TAKE 1 TABLET BY MOUTH EVERY DAY (Patient taking differently: Take 25 mcg by mouth daily.) 90 tablet 0   metFORMIN (GLUCOPHAGE) 500 MG tablet Take 1 tablet (500 mg total) by mouth 2 (two) times daily with a meal. (Patient taking differently: Take 500 mg by mouth at bedtime.) 180 tablet 0   nitroGLYCERIN (NITROSTAT) 0.4 MG SL tablet Place 1 tablet (0.4 mg total) under the tongue every 5 (five) minutes as needed. Chest pain (Patient taking differently: Place 0.4 mg under the tongue every 5 (five) minutes as needed for chest pain. Chest pain) 25 tablet 1   ondansetron (ZOFRAN) 4 MG tablet Take 1 tablet (4 mg total) by mouth every 8 (eight) hours as needed for nausea. 20 tablet 0   polyethylene glycol (MIRALAX / GLYCOLAX) 17 g packet Take 17 g by mouth daily as needed for mild constipation.      vitamin B-12 (CYANOCOBALAMIN) 1000 MCG tablet Take 1,000 mcg by mouth daily.     warfarin (COUMADIN) 5 MG tablet Take 1 tablet daily or Take as directed by anticoagulation clinic (Patient taking differently: Take 5 mg by mouth See admin instructions. Takes 2.5 mg on Thursday  and Monday and 5 mg on all other days) 90 tablet 1   Accu-Chek Softclix Lancets lancets Use to test blood sugars up to 4 times a day. 100 each 0   Blood Glucose Monitoring Suppl (BLOOD GLUCOSE MONITOR SYSTEM) w/Device KIT Use to test blood sugars up to 4 times daily. 1 kit 1   doxycycline (VIBRA-TABS) 100 MG tablet Take 1 tablet (100 mg total) by mouth every 12 (twelve) hours. 9 tablet 0   escitalopram (LEXAPRO) 10 MG tablet TAKE 1/2 TABLET ONCE DAILY FOR ONE WEEK AND THEN INCREASE TO ONE DAILY. (Patient taking differently: Take 10 mg by mouth daily.) 90 tablet 1   glucose blood (ACCU-CHEK AVIVA PLUS) test strip Test 2 times a day. (Patient not  taking: No sig reported) 300 strip 1   glucose blood test strip Use to test blood sugars up to 4 times a day. 100 each 0   insulin lispro (HUMALOG) 100 UNIT/ML KwikPen Before each meal 3 times a day, 140-199 - 2 units, 200-250 - 4 units, 251-299 - 6 units,  300-349 - 8 units,  350 or above 10 units. 15 mL 0   pantoprazole (PROTONIX) 40 MG tablet TAKE 1 TABLET BY MOUTH EVERY DAY (Patient taking differently: Take 40 mg by mouth daily.) 90 tablet 2   potassium chloride SA (KLOR-CON M20) 20 MEQ tablet Take 1 tablet (20 mEq total) by mouth daily. 90 tablet 1   pramipexole (MIRAPEX) 1.5 MG tablet TAKE 2 TABLETS BY MOUTH EVERY EVENING (Patient taking differently: Take 3 mg by mouth at bedtime.) 180 tablet 1   No current facility-administered medications for this encounter.    REVIEW OF SYSTEMS:  A 10+ POINT REVIEW OF SYSTEMS WAS OBTAINED including neurology, dermatology, psychiatry, cardiac, respiratory, lymph, extremities, GI, GU, musculoskeletal, constitutional, reproductive, HEENT.  Admitting symptoms as above.  Denies any problems with nausea or headaches at this time.   PHYSICAL EXAM: The patient is in no acute distress. Patient is alert and oriented.  Sitting comfortably in wheelchair.  He is hard of hearing  Lungs are clear to auscultation bilaterally. Heart has regular rate and rhythm. No palpable cervical, supraclavicular, or axillary adenopathy. Abdomen soft, non-tender, normal bowel  sounds.   LABORATORY DATA:  Lab Results  Component Value Date   WBC 7.3 10/10/2020   HGB 9.6 (L) 10/10/2020   HCT 29.8 (L) 10/10/2020   MCV 97.1 10/10/2020   PLT 156 10/10/2020   NEUTROABS 5.9 10/10/2020   Lab Results  Component Value Date   NA 128 (L) 10/10/2020   K 3.7 10/10/2020   CL 96 (L) 10/10/2020   CO2 21 (L) 10/10/2020   GLUCOSE 238 (H) 10/10/2020   CREATININE 1.62 (H) 10/10/2020   CALCIUM 8.3 (L) 10/10/2020      RADIOGRAPHY: CT ABDOMEN PELVIS WO CONTRAST  Result Date:  09/28/2020 CLINICAL DATA:  Nausea, vomiting, emesis EXAM: CT ABDOMEN AND PELVIS WITHOUT CONTRAST TECHNIQUE: Multidetector CT imaging of the abdomen and pelvis was performed following the standard protocol without IV contrast. COMPARISON:  07/23/2019 FINDINGS: Lower chest: No acute abnormality. Cardiomegaly. Coronary artery calcifications. Hepatobiliary: No focal liver abnormality is seen. Status post cholecystectomy. No biliary dilatation. Pancreas: Unremarkable. No pancreatic ductal dilatation or surrounding inflammatory changes. Spleen: Normal in size without significant abnormality. Adrenals/Urinary Tract: Adrenal glands are unremarkable. Nine bilateral renal cysts, including a high attenuation hemorrhagic or proteinaceous cyst of the posterior midportion of the right kidney. Kidneys are otherwise normal, without renal calculi, solid lesion, or hydronephrosis. Suprapubic catheter. Stomach/Bowel: Stomach is within normal limits. Appendix appears normal. No evidence of bowel wall thickening, distention, or inflammatory changes. Vascular/Lymphatic: Aortic atherosclerosis. No enlarged abdominal or pelvic lymph nodes. Reproductive: Prostatomegaly. Other: No abdominal wall hernia or abnormality. No abdominopelvic ascites. Musculoskeletal: No acute or significant osseous findings. IMPRESSION: 1. No acute noncontrast CT findings of the abdomen or pelvis to explain nausea or vomiting. 2. Suprapubic catheter. 3. Prostatomegaly. 4. Coronary artery disease. Aortic Atherosclerosis (ICD10-I70.0). Electronically Signed   By: Eddie Candle M.D.   On: 09/28/2020 11:14   CT HEAD WO CONTRAST (5MM)  Result Date: 10/02/2020 CLINICAL DATA:  Nystagmus, possible lymphoma of brain EXAM: CT HEAD WITHOUT CONTRAST TECHNIQUE: Contiguous axial images were obtained from the base of the skull through the vertex without intravenous contrast. COMPARISON:  09/24/2020, 09/29/2020 FINDINGS: Brain: No acute infarct or hemorrhage. Lateral  ventricles and midline structures are stable. 8 mm hyperdense area in the region of the cerebral aqueduct just inferior to the calcified pineal gland corresponds to the abnormal area of enhancement on recent MRI. No acute extra-axial fluid collections. No mass effect. Vascular: Stable atherosclerosis.  No hyperdense vessel. Skull: Normal. Negative for fracture or focal lesion. Sinuses/Orbits: No acute finding. Other: None. IMPRESSION: 1. No acute infarct or hemorrhage. 2. Subtle hyperdense area in the region of the cerebral aqueduct corresponds to the enhancing lesion seen on recent MRI, suspicious for malignancy. Electronically Signed   By: Randa Ngo M.D.   On: 10/02/2020 22:36   CT Head Wo Contrast  Result Date: 09/24/2020 CLINICAL DATA:  Headache.  Double vision. EXAM: CT HEAD WITHOUT CONTRAST TECHNIQUE: Contiguous axial images were obtained from the base of the skull through the vertex without intravenous contrast. COMPARISON:  CT head 07/23/2019. FINDINGS: Brain: No evidence of acute infarction, hemorrhage, hydrocephalus, extra-axial collection or mass lesion/mass effect. Again seen is mild diffuse atrophy, unchanged. There is stable mild patchy periventricular and deep white matter hypodensity, likely chronic small vessel ischemic change. Small calcified meningioma measuring 7 mm overlying the right tentorium appears unchanged from the prior exam. Vascular: Atherosclerotic calcifications are present within the cavernous internal carotid arteries. Skull: Normal. Negative for fracture or focal lesion. Sinuses/Orbits: No acute finding. Other: None.  IMPRESSION: No acute intracranial abnormality. Electronically Signed   By: Ronney Asters M.D.   On: 09/24/2020 21:07   CT CHEST WO CONTRAST  Result Date: 10/04/2020 CLINICAL DATA:  87 year old male with history of recurrent non-Hodgkin's lymphoma. Staging examination. EXAM: CT CHEST WITHOUT CONTRAST TECHNIQUE: Multidetector CT imaging of the chest was  performed following the standard protocol without IV contrast. COMPARISON:  CT the chest, abdomen and pelvis 07/23/2019. FINDINGS: Cardiovascular: Heart size is normal. There is no significant pericardial fluid, thickening or pericardial calcification. There is aortic atherosclerosis, as well as atherosclerosis of the great vessels of the mediastinum and the coronary arteries, including calcified atherosclerotic plaque in the left main, left anterior descending, left circumflex and right coronary arteries. Calcifications of the aortic valve and mild calcifications of the mitral annulus. Right internal jugular single-lumen porta cath with tip terminating in the superior aspect of the right atrium. Mediastinum/Nodes: No pathologically enlarged mediastinal or hilar lymph nodes. Please note that accurate exclusion of hilar adenopathy is limited on noncontrast CT scans. Esophagus is unremarkable in appearance. No axillary lymphadenopathy. Lungs/Pleura: No suspicious appearing pulmonary nodules or masses are noted. No acute consolidative airspace disease. No pleural effusions. Areas of chronic post infectious or inflammatory scarring are noted in the lung bases bilaterally (left greater than right). Upper Abdomen: Aortic atherosclerosis. 4.3 cm low-attenuation lesion in the anterior aspect of the interpolar region of the left kidney, incompletely characterized on today's non-contrast CT examination, but previously characterized as a simple cyst. Status post cholecystectomy. Musculoskeletal: There are no aggressive appearing lytic or blastic lesions noted in the visualized portions of the skeleton. IMPRESSION: 1. No lymphadenopathy or other findings to suggest recurrent lymphoma in the thorax. 2. No acute findings. 3. Aortic atherosclerosis, in addition to left main and 3 vessel coronary artery disease. 4. There are calcifications of the aortic valve and mitral annulus. Echocardiographic correlation for evaluation of  potential valvular dysfunction may be warranted if clinically indicated. Aortic Atherosclerosis (ICD10-I70.0). Electronically Signed   By: Vinnie Langton M.D.   On: 10/04/2020 09:07   MR BRAIN WO CONTRAST  Result Date: 09/28/2020 CLINICAL DATA:  Stroke, follow up EXAM: MRI HEAD WITHOUT CONTRAST TECHNIQUE: Multiplanar, multiecho pulse sequences of the brain and surrounding structures were obtained without intravenous contrast. COMPARISON:  Most recent MRI is from 2013 FINDINGS: Brain: There is no acute infarction or intracranial hemorrhage. There is no intracranial mass or significant mass effect. There is no hydrocephalus or extra-axial fluid collection. There is abnormal T2 hyperintensity involving the dorsal midbrain, pons, and medulla. Possible slight effacement of cerebral aqueduct focally. Additional patchy and confluent areas of T2 hyperintensity in the supratentorial white matter are nonspecific but probably reflect chronic microvascular ischemic changes. Prominence of the ventricles and sulci reflects parenchymal volume loss. Vascular: Major vessel flow voids at the skull base are preserved. Skull and upper cervical spine: Normal marrow signal is preserved. Sinuses/Orbits: Minor mucosal thickening. Bilateral lens replacements. Other: Sella is unremarkable.  Mastoid air cells are clear. IMPRESSION: Abnormal signal involving the dorsal brainstem with possible minor mass effect. The location accounts for reported intractable vomiting. Differential is wide and includes toxic/metabolic, infectious/post infectious, demyelinating/inflammatory, and neoplastic etiologies. Postcontrast imaging is recommended. Electronically Signed   By: Macy Mis M.D.   On: 09/28/2020 15:09   MR BRAIN W CONTRAST  Result Date: 09/29/2020 CLINICAL DATA:  Follow-up from prior exam obtained 1 day ago EXAM: MRI HEAD WITH CONTRAST TECHNIQUE: Multiplanar, multiecho pulse sequences of the brain and surrounding structures were  obtained with intravenous contrast. CONTRAST:  59m GADAVIST GADOBUTROL 1 MMOL/ML IV SOLN COMPARISON:  Noncontrast brain MRI obtained 1 day prior FINDINGS: Brain: There is focal masslike enhancement in the region of the cerebral aqueduct measuring 0.9 cm AP by 0.9 cm TV by 0.9 cm cc corresponding to T2/FLAIR signal abnormality seen on the prior study. There is separate nodular enhancement along the anterior aspect of the fourth ventricle more inferiorly. There is nodular appearing enhancement of the cranial nerve 7/8 nerve complexes in the bilateral IAC's. There is abnormal enhancement of the right fifth cranial nerve. There is a homogeneously enhancing lesion along the lateral aspect of the right tentorial leaflet measuring up to 0.8 cm by 0.5 cm corresponding to a calcified lesion present on prior head CTs, most likely reflecting a meningioma. Vascular: The major blood vessels enhance normally. Skull and upper cervical spine: Normal marrow signal. Sinuses/Orbits: Grossly unremarkable. Other: None. IMPRESSION: Masslike enhancement in the region of the cerebral aqueduct measuring up to 0.9 cm, and nodular enhancement along the ventral aspect of fourth ventricle and multiple cranial nerves as above. 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 Recommend correlation with lumbar puncture. Electronically Signed   By: PValetta MoleM.D.   On: 09/29/2020 11:53   DG CHEST PORT 1 VIEW  Result Date: 10/01/2020 CLINICAL DATA:  Shortness of breath EXAM: PORTABLE CHEST 1 VIEW COMPARISON:  August 07, 2019 FINDINGS: The cardiomediastinal silhouette is unchanged in contour.RIGHT chest port tip terminating over the superior cavoatrial junction. RIGHT-sided skinfold. No pleural effusion. No pneumothorax. Unchanged LEFT basilar atelectasis/scar. No acute pleuroparenchymal abnormality. Visualized abdomen is unremarkable. Multilevel degenerative changes of the thoracic spine. IMPRESSION: LEFT basilar  atelectasis Electronically Signed   By: SValentino SaxonM.D.   On: 10/01/2020 11:46      IMPRESSION: History of relapsed non-Hodgkin's lymphoma that has been in remission for several years ; now with new enhancing brain lesion suspicious for malignancy  History of stage 3 chronic kidney disease  Clinical presentation and brain MRI findings are suspicious for leptomeningeal relapse.      PLAN: The patient will proceed to CT simulation this morning.  We will  set up of radiation treatments to the brain and meninges.  Anticipate 10 treatments.  He will begin his radiation therapy tomorrow.   ------------------------------------------------  JBlair Promise PhD, MD  This document serves as a record of services personally performed by JGery Pray MD. It was created on his behalf by ERoney Mans a trained medical scribe. The creation of this record is based on the scribe's personal observations and the provider's statements to them. This document has been checked and approved by the attending provider.

## 2020-10-11 NOTE — Progress Notes (Signed)
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  Past or anticipated interventions, if any, per neurosurgery: no  Past or anticipated interventions, if any, per medical oncology: no, per Dr Marin Olp  Dose of Decadron, if applicable: yes  Recent neurologic symptoms, if any:  Seizures: no Headaches: yes Nausea: yes Dizziness/ataxia: yes Difficulty with hand coordination: no Focal numbness/weakness: yes, weakness in bilateral legs Visual deficits/changes: yes Confusion/Memory deficits: yes  Painful bone metastases at present, if any: no  SAFETY ISSUES: Prior radiation? yes Pacemaker/ICD? no Possible current pregnancy? no Is the patient on methotrexate? no  Additional Complaints / other details: difficulty hearing

## 2020-10-12 ENCOUNTER — Telehealth: Payer: Self-pay

## 2020-10-12 ENCOUNTER — Ambulatory Visit: Payer: Medicare Other

## 2020-10-12 ENCOUNTER — Other Ambulatory Visit: Payer: Self-pay | Admitting: Radiation Oncology

## 2020-10-12 ENCOUNTER — Ambulatory Visit
Admission: RE | Admit: 2020-10-12 | Discharge: 2020-10-12 | Disposition: A | Discharge status: Home / Self Care | Payer: Medicare Other | Source: Ambulatory Visit | Attending: Radiation Oncology | Admitting: Radiation Oncology

## 2020-10-12 DIAGNOSIS — C8589 Other specified types of non-Hodgkin lymphoma, extranodal and solid organ sites: Secondary | ICD-10-CM

## 2020-10-12 DIAGNOSIS — Z510 Encounter for antineoplastic radiation therapy: Secondary | ICD-10-CM | POA: Diagnosis not present

## 2020-10-12 MED ORDER — DEXAMETHASONE 6 MG PO TABS: 6.0000 mg | ORAL_TABLET | Freq: Every day | ORAL | 0 refills | Status: AC

## 2020-10-12 NOTE — Telephone Encounter (Signed)
Transition Care Management Unsuccessful Follow-up Telephone Call  Date of discharge and from where:  [facility]  Attempts:  1st Attempt  Reason for unsuccessful TCM follow-up call:  Unable to reach patient

## 2020-10-12 NOTE — Telephone Encounter (Signed)
Transition Care Management Follow-up Telephone Call Date of discharge and from where: 10/10/20 from [facility] How have you been since you were released from the hospital? Pretty good; sleeping better Any questions or concerns? No  Items Reviewed: Did the pt receive and understand the discharge instructions provided? Yes  Medications obtained and verified? Yes  Any new allergies since your discharge? No  Dietary orders reviewed? No Do you have support at home? Yes   Home Care and Equipment/Supplies: Were home health services ordered? Pt was having a private person come out to care for patient before hospitalization & that has resumed. If so, what is the name of the agency? "[facility]"  Has the agency set up a time to come to the patient's home? not applicable Were any new equipment or medical supplies ordered?  Yes: hospital bed & w/c What is the name of the medical supply agency? [employer] Were you able to get the supplies/equipment? yes Do you have any questions related to the use of the equipment or supplies? No  Functional Questionnaire: (I = Independent and D = Dependent) ADLs: I with assistance  Bathing/Dressing- D  Meal Prep- D  Eating- I  Maintaining continence- I  Transferring/Ambulation- D  Managing Meds- D  Follow up appointments reviewed:  PCP Hospital f/u appt confirmed? Yes  Scheduled to see Dr Elease Hashimoto on 10/17/20 @ 1000. [facility] f/u appt confirmed? Yes  Scheduled to see Oncology on 10/13/20 @ 0900. Are transportation arrangements needed? No If their condition worsens, is the pt aware to call PCP or go to the Emergency Dept.? Yes Was the patient provided with contact information for the PCP's office or ED? Yes Was to pt encouraged to call back with questions or concerns? Yes

## 2020-10-13 ENCOUNTER — Ambulatory Visit: Payer: Medicare Other

## 2020-10-13 ENCOUNTER — Telehealth: Payer: Self-pay

## 2020-10-13 ENCOUNTER — Other Ambulatory Visit: Payer: Self-pay

## 2020-10-13 ENCOUNTER — Ambulatory Visit
Admission: RE | Admit: 2020-10-13 | Discharge: 2020-10-13 | Disposition: A | Discharge status: Home / Self Care | Payer: Medicare Other | Source: Ambulatory Visit | Attending: Radiation Oncology | Admitting: Radiation Oncology

## 2020-10-13 DIAGNOSIS — R531 Weakness: Secondary | ICD-10-CM

## 2020-10-13 DIAGNOSIS — Z510 Encounter for antineoplastic radiation therapy: Secondary | ICD-10-CM | POA: Diagnosis not present

## 2020-10-13 NOTE — Telephone Encounter (Signed)
Transition Care Management Follow-up Telephone Call Date of discharge and from where: [facility] 10/10/2020 How have you been since you were released from the hospital? Doing radiation treatments   Any questions or concerns? No  Items Reviewed: Did the pt receive and understand the discharge instructions provided? Yes  Medications obtained and verified? Yes  Other? No  Any new allergies since your discharge? No  Dietary orders reviewed? Yes Do you have support at home? Yes   Home Care and Equipment/Supplies: Were home health services ordered? no If so, what is the name of the agency? N/a  Has the agency set up a time to come to the patient's home? not applicable Were any new equipment or medical supplies ordered?  No What is the name of the medical supply agency? N/a Were you able to get the supplies/equipment? not applicable Do you have any questions related to the use of the equipment or supplies? No  Functional Questionnaire: (I = Independent and D = Dependent) ADLs: D  Bathing/Dressing- d  Meal Prep- d  Eating- D  Maintaining continence- D  Transferring/Ambulation- D  Managing Meds- D  Follow up appointments reviewed:  PCP Hospital f/u appt confirmed? No  Family requesting video hospital follow up  [facility] f/u appt confirmed? Yes  under going radiation for comfort measures  Are transportation arrangements needed? No  If their condition worsens, is the pt aware to call PCP or go to the Emergency Dept.? Yes Was the patient provided with contact information for the PCP's office or ED? Yes Was to pt encouraged to call back with questions or concerns? Yes

## 2020-10-13 NOTE — Telephone Encounter (Signed)
Patient was released from hospital and need a light weight wheelchair or transport chair Lattie Haw asked if a Rx can be sent to [facility]. Call back # [phone]

## 2020-10-16 ENCOUNTER — Other Ambulatory Visit: Payer: Self-pay

## 2020-10-16 ENCOUNTER — Ambulatory Visit: Payer: Medicare Other

## 2020-10-16 ENCOUNTER — Ambulatory Visit
Admission: RE | Admit: 2020-10-16 | Discharge: 2020-10-16 | Disposition: A | Discharge status: Home / Self Care | Payer: Medicare Other | Source: Ambulatory Visit | Attending: Radiation Oncology | Admitting: Radiation Oncology

## 2020-10-16 DIAGNOSIS — Z510 Encounter for antineoplastic radiation therapy: Secondary | ICD-10-CM | POA: Diagnosis not present

## 2020-10-17 ENCOUNTER — Other Ambulatory Visit: Payer: Self-pay

## 2020-10-17 ENCOUNTER — Telehealth (INDEPENDENT_AMBULATORY_CARE_PROVIDER_SITE_OTHER): Payer: Medicare Other | Admitting: Family Medicine

## 2020-10-17 ENCOUNTER — Ambulatory Visit
Admission: RE | Admit: 2020-10-17 | Discharge: 2020-10-17 | Disposition: A | Discharge status: Home / Self Care | Payer: Medicare Other | Source: Ambulatory Visit | Attending: Radiation Oncology | Admitting: Radiation Oncology

## 2020-10-17 ENCOUNTER — Other Ambulatory Visit: Payer: Self-pay | Admitting: Radiation Oncology

## 2020-10-17 ENCOUNTER — Telehealth: Payer: Self-pay | Admitting: Nurse Practitioner

## 2020-10-17 ENCOUNTER — Ambulatory Visit: Payer: Medicare Other

## 2020-10-17 DIAGNOSIS — Z510 Encounter for antineoplastic radiation therapy: Secondary | ICD-10-CM | POA: Diagnosis not present

## 2020-10-17 DIAGNOSIS — D649 Anemia, unspecified: Secondary | ICD-10-CM | POA: Diagnosis not present

## 2020-10-17 DIAGNOSIS — E1140 Type 2 diabetes mellitus with diabetic neuropathy, unspecified: Secondary | ICD-10-CM

## 2020-10-17 DIAGNOSIS — Z8572 Personal history of non-Hodgkin lymphomas: Secondary | ICD-10-CM | POA: Diagnosis not present

## 2020-10-17 DIAGNOSIS — E871 Hypo-osmolality and hyponatremia: Secondary | ICD-10-CM

## 2020-10-17 DIAGNOSIS — Z794 Long term (current) use of insulin: Secondary | ICD-10-CM

## 2020-10-17 NOTE — Telephone Encounter (Signed)
Order has been placed. Spoke with the patient's daughter and she is aware the order has been placed.

## 2020-10-17 NOTE — Addendum Note (Signed)
Addended by: Rebecca Eaton on: 10/17/2020 08:26 AM   Modules accepted: Orders

## 2020-10-17 NOTE — Progress Notes (Signed)
Patient ID: Gary Dillinger., male   DOB: [birth year], 87 y.o.   MRN: [id number]  This visit type was conducted due to national recommendations for restrictions regarding the COVID-19 pandemic in an effort to limit this patient's exposure and mitigate transmission in our community.   Virtual Visit via Video Note  I connected with [facility] on 10/17/20 at 10:00 AM EDT by a video enabled telemedicine application and verified that I am speaking with the correct person using two identifiers.  Location patient: home Location provider:work or home office Persons participating in the virtual visit: patient, provider  I discussed the limitations of evaluation and management by telemedicine and the availability of in person appointments. The patient expressed understanding and agreed to proceed.   HPI:  This is a hospital follow-up visit.  Patient was admitted on 22 September discharge October 4.  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  Had neuroimaging concerning for leptomeningeal involvement  He had CT abdomen pelvis which showed no acute abnormality.  MRI brain revealed masslike enhancement of the cerebral aqueduct with nodular enhancement along the ventricle and multiple cranial nerves suspicious for leptomeningeal spread of malignancy.  LP on 29 September revealed numerous lymphoid cells with some atypical forms and occasional blasts.  Findings suggestive of lymphoma.  He has seen oncologist recently and radiation oncology.  Currently undergoing palliative radiation therapy.  Has had a few treatments thus far.  Is taking Decadron 6 mg daily.  He does have diabetes.  Recent blood sugars controlled.  They have not checked home sugars over the past few days.  He also had some hyponatremia with discharge sodium 128  Recent hemoglobin  9.6.  Overall he is stable at this time.  He is hard of hearing and this call was facilitated by Lattie Haw one of his daughters.  No fever.  Nausea controlled with ondansetron.    ROS: See pertinent positives and negatives per HPI.  Past Medical History:  Diagnosis Date   Anemia in chronic renal disease 05/07/2017   Anxiety    Atrial fibrillation (HCC)    COPD (chronic obstructive pulmonary disease) ([place])    pt. denies   Coronary artery disease    a. h/o Overlapping stents RCA;  b. 06/2011 Cath: patent stents, nonobs dzs, NL EF.   Diabetic peripheral neuropathy (HCC)    Diffuse non-Hodgkin's lymphoma of testis ([place]) 09/28/2015   DM (diabetes mellitus) ([place])    Type 2, peripheral neuropathy.   Dyspnea    with exertion   Dysrhythmia    GERD (gastroesophageal reflux disease)    Headache    History of bronchitis    History of kidney stones    History of radiation therapy 02/19/16 - 03/13/16   Testis/Scrotum: 32.4 Gy in 18 fractions   History of radiation therapy 08/07/16-08/20/16   left adrenal gland mass treated to 30 Gy in 10 fractions   Hyperlipidemia    Hypertension    Iron deficiency anemia due to chronic blood loss 08/08/2017   Low testosterone    Nephrolithiasis    OSA (obstructive sleep apnea) 11/26/2017   Osteoarthritis    shoulder   Restless leg    SVT (supraventricular tachycardia) (HCC)    Urinary frequency    Wears partial dentures    upper and lower    Past Surgical History:  Procedure Laterality Date  [place]  01/2013   CATARACT EXTRACTION, BILATERAL     CHOLECYSTECTOMY     COLONOSCOPY     CORONARY ANGIOPLASTY  2004   CYSTOSCOPY N/A 08/18/2017   Procedure: CYSTOSCOPY WITH FULGURATION AND SUPRA PUBIC TUBE PLACEMENT;  Surgeon: Kathie Rhodes, MD;  Location: [facility];  Service: Urology;  Laterality: N/A;   EYE SURGERY Bilateral    cataracts   IR GENERIC HISTORICAL  10/05/2015   IR US GUIDE VASC ACCESS RIGHT  10/05/2015 Marybelle Killings, MD [facility]   IR GENERIC HISTORICAL  10/05/2015   IR FLUORO GUIDE PORT INSERTION RIGHT 10/05/2015 Marybelle Killings, MD [facility]   LEFT HEART CATHETERIZATION WITH CORONARY ANGIOGRAM N/A 06/18/2011   Procedure: LEFT HEART CATHETERIZATION WITH CORONARY ANGIOGRAM;  Surgeon: Peter M Martinique, MD;  Location: [facility] CATH LAB;  Service: Cardiovascular;  Laterality: N/A;   LEFT HEART CATHETERIZATION WITH CORONARY ANGIOGRAM N/A 01/27/2013   Procedure: LEFT HEART CATHETERIZATION WITH CORONARY ANGIOGRAM;  Surgeon: Burnell Blanks, MD;  Location: [facility] CATH LAB;  Service: Cardiovascular;  Laterality: N/A;   LUMBAR LAMINECTOMY/DECOMPRESSION MICRODISCECTOMY N/A 02/07/2015   Procedure: Lumbar three-Sacral one Decompression;  Surgeon: Kevan Ny Ditty, MD;  Location: [facility] NEURO ORS;  Service: Neurosurgery;  Laterality: N/A;  L3 to S1 Decompression   MULTIPLE TOOTH EXTRACTIONS     ORCHIECTOMY Right 09/01/2015   Procedure: RIGHT ORCHIECTOMY;  Surgeon: Kathie Rhodes, MD;  Location: [facility];  Service: Urology;  Laterality: Right;   port a cath in place      ROTATOR CUFF REPAIR Left     Family History  Problem Relation Age of Onset   Alzheimer's disease Mother    Heart disease Mother    Heart disease Father    Migraines Father    Ulcers Father    Prostate cancer Brother    Coronary artery disease Other        Male 1st degree relative <50   Coronary artery disease Other        male 1st degree relative <60   Heart disease Sister    Obesity Sister        Morbid   Arthritis Sister    Heart disease Brother    Arthritis Brother    Sleep apnea Son    Obesity Son    Migraines Daughter    Thyroid disease Daughter     SOCIAL HX: Non-smoker.  Several very supportive children   Current Outpatient Medications:    Accu-Chek Softclix Lancets lancets, Use to test blood sugars up to 4 times a day., Disp: 100 each, Rfl: 0   acetaminophen (TYLENOL) 500 MG tablet, Take 1,000 mg by mouth  every 6 (six) hours as needed for moderate pain., Disp: , Rfl:    atorvastatin (LIPITOR) 40 MG tablet, TAKE 1 TABLET BY MOUTH EVERY DAY (Patient taking differently: Take 40 mg by mouth daily.), Disp: 90 tablet, Rfl: 0   Blood Glucose Monitoring Suppl (BLOOD GLUCOSE MONITOR SYSTEM) w/Device KIT, Use to test blood sugars up to 4 times daily., Disp: 1 kit, Rfl: 1   chlorhexidine (PERIDEX) 0.12 % solution, Use as directed 15 mLs in the mouth or throat 2 (two) times daily as needed (mouth pain)., Disp: , Rfl:    dexamethasone (DECADRON) 6 MG tablet, Take 1 tablet (6 mg total) by mouth daily., Disp: 30 tablet, Rfl: 0   doxycycline (VIBRA-TABS) 100 MG tablet, Take 1 tablet (100 mg total)  by mouth every 12 (twelve) hours., Disp: 9 tablet, Rfl: 0   escitalopram (LEXAPRO) 10 MG tablet, TAKE 1/2 TABLET ONCE DAILY FOR ONE WEEK AND THEN INCREASE TO ONE DAILY. (Patient taking differently: Take 10 mg by mouth daily.), Disp: 90 tablet, Rfl: 1   furosemide (LASIX) 40 MG tablet, Take 40 mg by mouth daily., Disp: , Rfl:    gabapentin (NEURONTIN) 300 MG capsule, TAKE 2 CAPSULES BY MOUTH 3 TIMES A DAY (Patient taking differently: Take 600 mg by mouth 2 (two) times daily.), Disp: 540 capsule, Rfl: 1   glucose blood (ACCU-CHEK AVIVA PLUS) test strip, Test 2 times a day., Disp: 300 strip, Rfl: 1   glucose blood test strip, Use to test blood sugars up to 4 times a day., Disp: 100 each, Rfl: 0   insulin lispro (HUMALOG) 100 UNIT/ML KwikPen, Before each meal 3 times a day, 140-199 - 2 units, 200-250 - 4 units, 251-299 - 6 units,  300-349 - 8 units,  350 or above 10 units., Disp: 15 mL, Rfl: 0   Insulin Pen Needle 32G X 4 MM MISC, Use to inject insulin 3 to 4 times daily., Disp: 100 each, Rfl: 0   levothyroxine (SYNTHROID) 25 MCG tablet, TAKE 1 TABLET BY MOUTH EVERY DAY (Patient taking differently: Take 25 mcg by mouth daily.), Disp: 90 tablet, Rfl: 0   metFORMIN (GLUCOPHAGE) 500 MG tablet, Take 1 tablet (500 mg total) by mouth 2  (two) times daily with a meal. (Patient taking differently: Take 500 mg by mouth at bedtime.), Disp: 180 tablet, Rfl: 0   nitroGLYCERIN (NITROSTAT) 0.4 MG SL tablet, Place 1 tablet (0.4 mg total) under the tongue every 5 (five) minutes as needed. Chest pain (Patient taking differently: Place 0.4 mg under the tongue every 5 (five) minutes as needed for chest pain. Chest pain), Disp: 25 tablet, Rfl: 1   ondansetron (ZOFRAN) 4 MG tablet, Take 1 tablet (4 mg total) by mouth every 8 (eight) hours as needed for nausea., Disp: 20 tablet, Rfl: 0   pantoprazole (PROTONIX) 40 MG tablet, TAKE 1 TABLET BY MOUTH EVERY DAY (Patient taking differently: Take 40 mg by mouth daily.), Disp: 90 tablet, Rfl: 2   polyethylene glycol (MIRALAX / GLYCOLAX) 17 g packet, Take 17 g by mouth daily as needed for mild constipation. , Disp: , Rfl:    potassium chloride SA (KLOR-CON M20) 20 MEQ tablet, Take 1 tablet (20 mEq total) by mouth daily., Disp: 90 tablet, Rfl: 1   pramipexole (MIRAPEX) 1.5 MG tablet, TAKE 2 TABLETS BY MOUTH EVERY EVENING (Patient taking differently: Take 3 mg by mouth at bedtime.), Disp: 180 tablet, Rfl: 1   vitamin B-12 (CYANOCOBALAMIN) 1000 MCG tablet, Take 1,000 mcg by mouth daily., Disp: , Rfl:    warfarin (COUMADIN) 5 MG tablet, Take 1 tablet daily or Take as directed by anticoagulation clinic (Patient taking differently: Take 5 mg by mouth See admin instructions. Takes 2.5 mg on Thursday  and Monday and 5 mg on all other days), Disp: 90 tablet, Rfl: 1  EXAM:  VITALS per patient if applicable:  GENERAL: alert, oriented, appears well and in no acute distress  HEENT: atraumatic, conjunttiva clear, no obvious abnormalities on inspection of external nose and ears  NECK: normal movements of the head and neck  LUNGS: on inspection no signs of respiratory distress, breathing rate appears normal, no obvious gross SOB, gasping or wheezing  CV: no obvious cyanosis  MS: moves all visible extremities  without noticeable abnormality  PSYCH/NEURO: pleasant and cooperative, no obvious depression or anxiety, speech and thought processing grossly intact  ASSESSMENT AND PLAN:  Discussed the following assessment and plan:  #1 history of non-Hodgkin's lymphoma with probable leptomeningeal spread.  Patient undergoing radiation therapy currently.  Followed by oncology and radiation oncology He was placed on Decadron and will need to monitor CBGs closely -Family understand that his treatment is basically palliative at this point and they are comfortable with that.  #2 type 2 diabetes.  Currently on Decadron.  Discussed with his daughter Lattie Haw monitoring CBGs several times daily.  He does have Humalog and sliding scale to use if necessary at home.  #3 hyponatremia.  Question SIADH.  Future lab order for basic metabolic panel placed and they will try to get in next week for that  #4 normocytic anemia with hemoglobin recently 9.6.  Future lab order placed for CBC  We also sent an order yesterday for transport wheelchair.  Their current wheelchair was very bulky and heavy and difficult to maneuver.     I discussed the assessment and treatment plan with the patient. The patient was provided an opportunity to ask questions and all were answered. The patient agreed with the plan and demonstrated an understanding of the instructions.   The patient was advised to call back or seek an in-person evaluation if the symptoms worsen or if the condition fails to improve as anticipated.     Carolann Littler, MD

## 2020-10-18 ENCOUNTER — Ambulatory Visit: Payer: Medicare Other

## 2020-10-18 ENCOUNTER — Ambulatory Visit
Admission: RE | Admit: 2020-10-18 | Discharge: 2020-10-18 | Disposition: A | Discharge status: Home / Self Care | Payer: Medicare Other | Source: Ambulatory Visit | Attending: Radiation Oncology | Admitting: Radiation Oncology

## 2020-10-18 ENCOUNTER — Ambulatory Visit: Payer: Medicare Other | Admitting: Radiation Oncology

## 2020-10-18 DIAGNOSIS — Z8572 Personal history of non-Hodgkin lymphomas: Secondary | ICD-10-CM

## 2020-10-18 DIAGNOSIS — D500 Iron deficiency anemia secondary to blood loss (chronic): Secondary | ICD-10-CM

## 2020-10-18 DIAGNOSIS — Z510 Encounter for antineoplastic radiation therapy: Secondary | ICD-10-CM | POA: Diagnosis not present

## 2020-10-18 DIAGNOSIS — D631 Anemia in chronic kidney disease: Secondary | ICD-10-CM

## 2020-10-18 LAB — FERRITIN: Ferritin: 928 ng/mL — ABNORMAL HIGH (ref 24–336)

## 2020-10-18 LAB — CMP (CANCER CENTER ONLY)
ALT: 16 U/L (ref 0–44)
AST: 23 U/L (ref 15–41)
Albumin: 3.5 g/dL (ref 3.5–5.0)
Alkaline Phosphatase: 77 U/L (ref 38–126)
Anion gap: 10 (ref 5–15)
BUN: 44 mg/dL — ABNORMAL HIGH (ref 8–23)
CO2: 29 mmol/L (ref 22–32)
Calcium: 9.1 mg/dL (ref 8.9–10.3)
Chloride: 97 mmol/L — ABNORMAL LOW (ref 98–111)
Creatinine: 1.92 mg/dL — ABNORMAL HIGH (ref 0.61–1.24)
GFR, Estimated: 34 mL/min — ABNORMAL LOW (ref >60)
Glucose, Bld: 210 mg/dL — ABNORMAL HIGH (ref 70–99)
Potassium: 4.9 mmol/L (ref 3.5–5.1)
Sodium: 136 mmol/L (ref 135–145)
Total Bilirubin: 0.6 mg/dL (ref 0.3–1.2)
Total Protein: 6.6 g/dL (ref 6.5–8.1)

## 2020-10-18 LAB — CBC WITH DIFFERENTIAL (CANCER CENTER ONLY)
Abs Immature Granulocytes: 0.08 10*3/uL — ABNORMAL HIGH (ref 0.00–0.07)
Basophils Absolute: 0 10*3/uL (ref 0.0–0.1)
Basophils Relative: 0 %
Eosinophils Absolute: 0 10*3/uL (ref 0.0–0.5)
Eosinophils Relative: 0 %
HCT: 36.5 % — ABNORMAL LOW (ref 39.0–52.0)
Hemoglobin: 12.1 g/dL — ABNORMAL LOW (ref 13.0–17.0)
Immature Granulocytes: 1 %
Lymphocytes Relative: 5 %
Lymphs Abs: 0.7 10*3/uL (ref 0.7–4.0)
MCH: 31.8 pg (ref 26.0–34.0)
MCHC: 33.2 g/dL (ref 30.0–36.0)
MCV: 95.8 fL (ref 80.0–100.0)
Monocytes Absolute: 0.2 10*3/uL (ref 0.1–1.0)
Monocytes Relative: 2 %
Neutro Abs: 12.9 10*3/uL — ABNORMAL HIGH (ref 1.7–7.7)
Neutrophils Relative %: 92 %
Platelet Count: 165 10*3/uL (ref 150–400)
RBC: 3.81 MIL/uL — ABNORMAL LOW (ref 4.22–5.81)
RDW: 15.3 % (ref 11.5–15.5)
WBC Count: 13.9 10*3/uL — ABNORMAL HIGH (ref 4.0–10.5)
nRBC: 0 % (ref 0.0–0.2)

## 2020-10-18 LAB — IRON AND TIBC
Iron: 62 ug/dL (ref 45–182)
Saturation Ratios: 27 % (ref 17.9–39.5)
TIBC: 228 ug/dL — ABNORMAL LOW (ref 250–450)
UIBC: 166 ug/dL

## 2020-10-18 LAB — LACTATE DEHYDROGENASE: LDH: 223 U/L — ABNORMAL HIGH (ref 98–192)

## 2020-10-18 LAB — RETICULOCYTES
Immature Retic Fract: 5.6 % (ref 2.3–15.9)
RBC.: 3.85 MIL/uL — ABNORMAL LOW (ref 4.22–5.81)
Retic Count, Absolute: 60.1 10*3/uL (ref 19.0–186.0)
Retic Ct Pct: 1.6 % (ref 0.4–3.1)

## 2020-10-19 ENCOUNTER — Ambulatory Visit: Payer: Medicare Other

## 2020-10-19 ENCOUNTER — Other Ambulatory Visit: Payer: Self-pay

## 2020-10-19 ENCOUNTER — Ambulatory Visit
Admission: RE | Admit: 2020-10-19 | Discharge: 2020-10-19 | Disposition: A | Discharge status: Home / Self Care | Payer: Medicare Other | Source: Ambulatory Visit | Attending: Radiation Oncology | Admitting: Radiation Oncology

## 2020-10-19 DIAGNOSIS — Z510 Encounter for antineoplastic radiation therapy: Secondary | ICD-10-CM | POA: Diagnosis not present

## 2020-10-20 ENCOUNTER — Ambulatory Visit
Admission: RE | Admit: 2020-10-20 | Discharge: 2020-10-20 | Disposition: A | Discharge status: Home / Self Care | Payer: Medicare Other | Source: Ambulatory Visit | Attending: Radiation Oncology | Admitting: Radiation Oncology

## 2020-10-20 ENCOUNTER — Ambulatory Visit: Payer: Medicare Other

## 2020-10-20 DIAGNOSIS — Z510 Encounter for antineoplastic radiation therapy: Secondary | ICD-10-CM | POA: Diagnosis not present

## 2020-10-23 ENCOUNTER — Ambulatory Visit
Admission: RE | Admit: 2020-10-23 | Discharge: 2020-10-23 | Disposition: A | Discharge status: Home / Self Care | Payer: Medicare Other | Source: Ambulatory Visit | Attending: Radiation Oncology | Admitting: Radiation Oncology

## 2020-10-23 ENCOUNTER — Ambulatory Visit: Payer: Medicare Other

## 2020-10-23 ENCOUNTER — Other Ambulatory Visit: Payer: Self-pay

## 2020-10-23 DIAGNOSIS — Z510 Encounter for antineoplastic radiation therapy: Secondary | ICD-10-CM | POA: Diagnosis not present

## 2020-10-24 ENCOUNTER — Telehealth: Payer: Self-pay | Admitting: Family Medicine

## 2020-10-24 ENCOUNTER — Inpatient Hospital Stay: Payer: Medicare Other | Admitting: Nurse Practitioner

## 2020-10-24 ENCOUNTER — Ambulatory Visit: Payer: Medicare Other

## 2020-10-24 ENCOUNTER — Ambulatory Visit
Admission: RE | Admit: 2020-10-24 | Discharge: 2020-10-24 | Disposition: A | Discharge status: Home / Self Care | Payer: Medicare Other | Source: Ambulatory Visit | Attending: Radiation Oncology | Admitting: Radiation Oncology

## 2020-10-24 DIAGNOSIS — Z510 Encounter for antineoplastic radiation therapy: Secondary | ICD-10-CM | POA: Diagnosis not present

## 2020-10-24 NOTE — Telephone Encounter (Signed)
Pt Daughter called and stated he need a transport Chair sent to [facility].

## 2020-10-24 NOTE — Patient Instructions (Signed)
Beginning Thursday, 10/26/2020 take steroid as follows: 1/2 tablet daily for 7 days then 1/2 tablet every other day for 7 days then stop medication.

## 2020-10-25 ENCOUNTER — Other Ambulatory Visit: Payer: Self-pay

## 2020-10-25 ENCOUNTER — Ambulatory Visit (INDEPENDENT_AMBULATORY_CARE_PROVIDER_SITE_OTHER): Payer: Medicare Other

## 2020-10-25 ENCOUNTER — Ambulatory Visit: Payer: Medicare Other

## 2020-10-25 ENCOUNTER — Inpatient Hospital Stay: Payer: Medicare Other | Attending: Hematology & Oncology | Admitting: Nurse Practitioner

## 2020-10-25 ENCOUNTER — Ambulatory Visit
Admission: RE | Admit: 2020-10-25 | Discharge: 2020-10-25 | Disposition: A | Discharge status: Home / Self Care | Payer: Medicare Other | Source: Ambulatory Visit | Attending: Radiation Oncology | Admitting: Radiation Oncology

## 2020-10-25 VITALS — BP 104/66 | HR 81 | Temp 98.4°F | Resp 18 | Wt 182.0 lb

## 2020-10-25 DIAGNOSIS — Z9221 Personal history of antineoplastic chemotherapy: Secondary | ICD-10-CM | POA: Insufficient documentation

## 2020-10-25 DIAGNOSIS — C8589 Other specified types of non-Hodgkin lymphoma, extranodal and solid organ sites: Secondary | ICD-10-CM | POA: Diagnosis not present

## 2020-10-25 DIAGNOSIS — I4891 Unspecified atrial fibrillation: Secondary | ICD-10-CM | POA: Insufficient documentation

## 2020-10-25 DIAGNOSIS — Z515 Encounter for palliative care: Secondary | ICD-10-CM

## 2020-10-25 DIAGNOSIS — Z8572 Personal history of non-Hodgkin lymphomas: Secondary | ICD-10-CM | POA: Insufficient documentation

## 2020-10-25 DIAGNOSIS — Z79899 Other long term (current) drug therapy: Secondary | ICD-10-CM | POA: Insufficient documentation

## 2020-10-25 DIAGNOSIS — Z794 Long term (current) use of insulin: Secondary | ICD-10-CM | POA: Insufficient documentation

## 2020-10-25 DIAGNOSIS — C8336 Diffuse large B-cell lymphoma, intrapelvic lymph nodes: Secondary | ICD-10-CM | POA: Insufficient documentation

## 2020-10-25 DIAGNOSIS — Z923 Personal history of irradiation: Secondary | ICD-10-CM | POA: Insufficient documentation

## 2020-10-25 DIAGNOSIS — Z510 Encounter for antineoplastic radiation therapy: Secondary | ICD-10-CM | POA: Diagnosis not present

## 2020-10-25 DIAGNOSIS — N183 Chronic kidney disease, stage 3 unspecified: Secondary | ICD-10-CM | POA: Insufficient documentation

## 2020-10-25 DIAGNOSIS — D631 Anemia in chronic kidney disease: Secondary | ICD-10-CM | POA: Insufficient documentation

## 2020-10-25 DIAGNOSIS — Z7901 Long term (current) use of anticoagulants: Secondary | ICD-10-CM

## 2020-10-25 DIAGNOSIS — E119 Type 2 diabetes mellitus without complications: Secondary | ICD-10-CM | POA: Insufficient documentation

## 2020-10-25 LAB — POCT INR: INR: 4.2 — AB (ref 2.0–3.0)

## 2020-10-25 NOTE — Patient Instructions (Addendum)
Pre visit review using our clinic review tool, if applicable. No additional management support is needed unless otherwise documented below in the visit note.  Hold dose today and hold dose tomorrow and then change weekly dose to take 1/2 tablet daily except take 1 tablet on Mon, Wed, and Fri. Recheck in 2 wks.

## 2020-10-25 NOTE — Progress Notes (Signed)
[place]  [phone] [phone]) [phone]   Name: Gary Yates. Date: 10/25/2020 MRN: [id number]  DOB: 01/17/1933  Patient Care Team: [name], MD as PCP - General (Family Medicine) Burnell Blanks, MD as PCP - Cardiology (Cardiology) Burnell Blanks, MD as Consulting Physician (Cardiology)    REASON FOR CONSULTATION: Gary Yates. is a 87 y.o. 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  Palliative ask to see for symptom management and goals of care.    SOCIAL HISTORY:     reports that he quit smoking about 42 years ago. His smoking use included cigarettes. He has a 30.00 pack-year smoking history. He has never used smokeless tobacco. He reports that he does not drink alcohol and does not use drugs.  ADVANCE DIRECTIVES:  Advanced Directives on file and personally reviewed. Filed under ACP/Vynca.   CODE STATUS: DNR  PAST MEDICAL HISTORY: Past Medical History:  Diagnosis Date   Anemia in chronic renal disease 05/07/2017   Anxiety    Atrial fibrillation (HCC)    COPD (chronic obstructive pulmonary disease) ([place])    pt. denies   Coronary artery disease    a. h/o Overlapping stents RCA;  b. 06/2011 Cath: patent stents, nonobs dzs, NL EF.   Diabetic peripheral neuropathy (HCC)    Diffuse non-Hodgkin's lymphoma of testis ([place]) 09/28/2015   DM (diabetes mellitus) ([place])    Type 2, peripheral neuropathy.   Dyspnea    with exertion   Dysrhythmia    GERD (gastroesophageal reflux disease)    Headache    History of bronchitis    History of kidney stones    History of radiation therapy 02/19/16 - 03/13/16   Testis/Scrotum: 32.4 Gy in 18 fractions   History of radiation  therapy 08/07/16-08/20/16   left adrenal gland mass treated to 30 Gy in 10 fractions   Hyperlipidemia    Hypertension    Iron deficiency anemia due to chronic blood loss 08/08/2017   Low testosterone    Nephrolithiasis    OSA (obstructive sleep apnea) 11/26/2017   Osteoarthritis    shoulder   Restless leg    SVT (supraventricular tachycardia) ([place])    Urinary frequency    Wears partial dentures    upper and lower    PAST SURGICAL HISTORY:  Past Surgical History:  Procedure Laterality Date   [place]  01/2013   CATARACT EXTRACTION, BILATERAL     CHOLECYSTECTOMY     COLONOSCOPY     CORONARY ANGIOPLASTY  2004   CYSTOSCOPY N/A 08/18/2017   Procedure: CYSTOSCOPY WITH FULGURATION AND SUPRA PUBIC TUBE PLACEMENT;  Surgeon: Kathie Rhodes, MD;  Location: [facility];  Service: Urology;  Laterality: N/A;   EYE SURGERY Bilateral    cataracts   IR GENERIC HISTORICAL  10/05/2015   IR US GUIDE VASC ACCESS RIGHT 10/05/2015 Marybelle Killings, MD [facility]   IR GENERIC HISTORICAL  10/05/2015   IR FLUORO GUIDE PORT INSERTION RIGHT 10/05/2015 Marybelle Killings, MD [facility]   LEFT HEART CATHETERIZATION WITH CORONARY ANGIOGRAM N/A 06/18/2011   Procedure: LEFT HEART CATHETERIZATION WITH CORONARY ANGIOGRAM;  Surgeon: Peter M Martinique, MD;  Location: [facility] CATH LAB;  Service: Cardiovascular;  Laterality: N/A;   LEFT HEART CATHETERIZATION WITH CORONARY ANGIOGRAM N/A 01/27/2013   Procedure: LEFT HEART CATHETERIZATION WITH CORONARY ANGIOGRAM;  Surgeon: Burnell Blanks, MD;  Location: [facility] CATH LAB;  Service: Cardiovascular;  Laterality: N/A;   LUMBAR LAMINECTOMY/DECOMPRESSION MICRODISCECTOMY N/A 02/07/2015   Procedure: Lumbar three-Sacral one Decompression;  Surgeon: Kevan Ny Ditty, MD;  Location: [place] NEURO ORS;  Service: Neurosurgery;  Laterality: N/A;  L3 to S1 Decompression   MULTIPLE TOOTH EXTRACTIONS     ORCHIECTOMY Right 09/01/2015   Procedure: RIGHT  ORCHIECTOMY;  Surgeon: Kathie Rhodes, MD;  Location: [facility];  Service: Urology;  Laterality: Right;   port a cath in place      ROTATOR CUFF REPAIR Left     HEMATOLOGY/ONCOLOGY HISTORY:  Oncology History  Anemia in chronic renal disease    ALLERGIES:  is allergic to latex, ace inhibitors, codeine, compazine [prochlorperazine], and penicillins.  MEDICATIONS:  Current Outpatient Medications  Medication Sig Dispense Refill   Accu-Chek Softclix Lancets lancets Use to test blood sugars up to 4 times a day. 100 each 0   acetaminophen (TYLENOL) 500 MG tablet Take 1,000 mg by mouth every 6 (six) hours as needed for moderate pain.     atorvastatin (LIPITOR) 40 MG tablet TAKE 1 TABLET BY MOUTH EVERY DAY (Patient taking differently: Take 40 mg by mouth daily.) 90 tablet 0   Blood Glucose Monitoring Suppl (BLOOD GLUCOSE MONITOR SYSTEM) w/Device KIT Use to test blood sugars up to 4 times daily. 1 kit 1   chlorhexidine (PERIDEX) 0.12 % solution Use as directed 15 mLs in the mouth or throat 2 (two) times daily as needed (mouth pain).     dexamethasone (DECADRON) 6 MG tablet Take 1 tablet (6 mg total) by mouth daily. 30 tablet 0   doxycycline (VIBRA-TABS) 100 MG tablet Take 1 tablet (100 mg total) by mouth every 12 (twelve) hours. 9 tablet 0   escitalopram (LEXAPRO) 10 MG tablet TAKE 1/2 TABLET ONCE DAILY FOR ONE WEEK AND THEN INCREASE TO ONE DAILY. (Patient taking differently: Take 10 mg by mouth daily.) 90 tablet 1   gabapentin (NEURONTIN) 300 MG capsule TAKE 2 CAPSULES BY MOUTH 3 TIMES A DAY (Patient taking differently: Take 600 mg by mouth 2 (two) times daily.) 540 capsule 1   glucose blood (ACCU-CHEK AVIVA PLUS) test strip Test 2 times a day. 300 strip 1   glucose blood test strip Use to test blood sugars up to 4 times a day. 100 each 0   insulin lispro (HUMALOG) 100 UNIT/ML KwikPen Before each meal 3 times a day, 140-199 - 2 units, 200-250 - 4 units, 251-299 - 6 units,  300-349 - 8 units,  350 or  above 10 units. 15 mL 0   Insulin Pen Needle 32G X 4 MM MISC Use to inject insulin 3 to 4 times daily. 100 each 0   levothyroxine (SYNTHROID) 25 MCG tablet TAKE 1 TABLET BY MOUTH EVERY DAY (Patient taking differently: Take 25 mcg by mouth daily.) 90 tablet 0   metFORMIN (GLUCOPHAGE) 500 MG tablet Take 1 tablet (500 mg total) by mouth 2 (two) times daily with a meal. (Patient taking differently: Take 500 mg by mouth at bedtime.) 180 tablet 0   nitroGLYCERIN (NITROSTAT) 0.4 MG SL tablet Place 1 tablet (0.4 mg total) under the tongue every 5 (five) minutes as needed. Chest pain (Patient taking differently: Place 0.4 mg under the tongue every 5 (five) minutes as needed for chest pain. Chest pain) 25 tablet  1   ondansetron (ZOFRAN) 4 MG tablet Take 1 tablet (4 mg total) by mouth every 8 (eight) hours as needed for nausea. 20 tablet 0   pantoprazole (PROTONIX) 40 MG tablet TAKE 1 TABLET BY MOUTH EVERY DAY (Patient taking differently: Take 40 mg by mouth daily.) 90 tablet 2   polyethylene glycol (MIRALAX / GLYCOLAX) 17 g packet Take 17 g by mouth daily as needed for mild constipation.      potassium chloride SA (KLOR-CON M20) 20 MEQ tablet Take 1 tablet (20 mEq total) by mouth daily. 90 tablet 1   pramipexole (MIRAPEX) 1.5 MG tablet TAKE 2 TABLETS BY MOUTH EVERY EVENING (Patient taking differently: Take 3 mg by mouth at bedtime.) 180 tablet 1   vitamin B-12 (CYANOCOBALAMIN) 1000 MCG tablet Take 1,000 mcg by mouth daily.     warfarin (COUMADIN) 5 MG tablet Take 1 tablet daily or Take as directed by anticoagulation clinic (Patient taking differently: Take 5 mg by mouth See admin instructions. Takes 2.5 mg on Thursday  and Monday and 5 mg on all other days) 90 tablet 1   furosemide (LASIX) 40 MG tablet Take 40 mg by mouth daily.     No current facility-administered medications for this visit.    VITAL SIGNS: BP 104/66 (BP Location: Left Arm, Patient Position: Sitting)   Pulse 81   Temp 98.4 F (36.9 C)  (Oral)   Resp 18   Wt 182 lb (82.6 kg)   SpO2 100%   BMI 26.11 kg/m  Filed Weights   10/25/20 1113  Weight: 182 lb (82.6 kg)    Estimated body mass index is 26.11 kg/m as calculated from the following:   Height as of 09/29/20: _0  (1.778 m).   Weight as of this encounter: 182 lb (82.6 kg).  LABS: CBC:    Component Value Date/Time   WBC 13.9 (H) 10/18/2020 1541   WBC 7.3 10/10/2020 0055   HGB 12.1 (L) 10/18/2020 1541   HGB 10.5 (L) 01/10/2017 1136   HCT 36.5 (L) 10/18/2020 1541   HCT 31.5 (L) 01/10/2017 1136   PLT 165 10/18/2020 1541   PLT 123 (L) 01/10/2017 1136   MCV 95.8 10/18/2020 1541   MCV 95 01/10/2017 1136   NEUTROABS 12.9 (H) 10/18/2020 1541   NEUTROABS 2.8 01/10/2017 1136   LYMPHSABS 0.7 10/18/2020 1541   LYMPHSABS 0.9 01/10/2017 1136   MONOABS 0.2 10/18/2020 1541   EOSABS 0.0 10/18/2020 1541   EOSABS 0.1 01/10/2017 1136   BASOSABS 0.0 10/18/2020 1541   BASOSABS 0.0 01/10/2017 1136   Comprehensive Metabolic Panel:    Component Value Date/Time   NA 136 10/18/2020 1541   NA 137 08/18/2019 0000   NA 141 01/10/2017 1136   NA 137 12/14/2015 1100   K 4.9 10/18/2020 1541   K 4.4 01/10/2017 1136   K 3.7 12/14/2015 1100   CL 97 (L) 10/18/2020 1541   CL 104 01/10/2017 1136   CO2 29 10/18/2020 1541   CO2 27 01/10/2017 1136   CO2 24 12/14/2015 1100   BUN 44 (H) 10/18/2020 1541   BUN 29 (A) 08/18/2019 0000   BUN 19 01/10/2017 1136   BUN 18.4 12/14/2015 1100   CREATININE 1.92 (H) 10/18/2020 1541   CREATININE 1.76 (H) 10/08/2019 0100   CREATININE 0.9 12/14/2015 1100   GLUCOSE 210 (H) 10/18/2020 1541   GLUCOSE 132 (H) 01/10/2017 1136   CALCIUM 9.1 10/18/2020 1541   CALCIUM 9.4 01/10/2017 1136   CALCIUM 9.5 12/14/2015  1100   AST 23 10/18/2020 1541   AST 22 12/14/2015 1100   ALT 16 10/18/2020 1541   ALT 18 01/10/2017 1136   ALT 10 12/14/2015 1100   ALKPHOS 77 10/18/2020 1541   ALKPHOS 112 (H) 01/10/2017 1136   ALKPHOS 102 12/14/2015 1100   BILITOT  0.6 10/18/2020 1541   BILITOT 0.58 12/14/2015 1100   PROT 6.6 10/18/2020 1541   PROT 6.5 01/10/2017 1136   PROT 6.6 12/14/2015 1100   ALBUMIN 3.5 10/18/2020 1541   ALBUMIN 3.4 01/10/2017 1136   ALBUMIN 3.4 (L) 12/14/2015 1100    RADIOGRAPHIC STUDIES: CT ABDOMEN PELVIS WO CONTRAST  Result Date: 09/28/2020 CLINICAL DATA:  Nausea, vomiting, emesis EXAM: CT ABDOMEN AND PELVIS WITHOUT CONTRAST TECHNIQUE: Multidetector CT imaging of the abdomen and pelvis was performed following the standard protocol without IV contrast. COMPARISON:  07/23/2019 FINDINGS: Lower chest: No acute abnormality. Cardiomegaly. Coronary artery calcifications. Hepatobiliary: No focal liver abnormality is seen. Status post cholecystectomy. No biliary dilatation. Pancreas: Unremarkable. No pancreatic ductal dilatation or surrounding inflammatory changes. Spleen: Normal in size without significant abnormality. Adrenals/Urinary Tract: Adrenal glands are unremarkable. Nine bilateral renal cysts, including a high attenuation hemorrhagic or proteinaceous cyst of the posterior midportion of the right kidney. Kidneys are otherwise normal, without renal calculi, solid lesion, or hydronephrosis. Suprapubic catheter. Stomach/Bowel: Stomach is within normal limits. Appendix appears normal. No evidence of bowel wall thickening, distention, or inflammatory changes. Vascular/Lymphatic: Aortic atherosclerosis. No enlarged abdominal or pelvic lymph nodes. Reproductive: Prostatomegaly. Other: No abdominal wall hernia or abnormality. No abdominopelvic ascites. Musculoskeletal: No acute or significant osseous findings. IMPRESSION: 1. No acute noncontrast CT findings of the abdomen or pelvis to explain nausea or vomiting. 2. Suprapubic catheter. 3. Prostatomegaly. 4. Coronary artery disease. Aortic Atherosclerosis (ICD10-I70.0). Electronically Signed   By: Eddie Candle M.D.   On: 09/28/2020 11:14   CT HEAD WO CONTRAST (5MM)  Result Date:  10/02/2020 CLINICAL DATA:  Nystagmus, possible lymphoma of brain EXAM: CT HEAD WITHOUT CONTRAST TECHNIQUE: Contiguous axial images were obtained from the base of the skull through the vertex without intravenous contrast. COMPARISON:  09/24/2020, 09/29/2020 FINDINGS: Brain: No acute infarct or hemorrhage. Lateral ventricles and midline structures are stable. 8 mm hyperdense area in the region of the cerebral aqueduct just inferior to the calcified pineal gland corresponds to the abnormal area of enhancement on recent MRI. No acute extra-axial fluid collections. No mass effect. Vascular: Stable atherosclerosis.  No hyperdense vessel. Skull: Normal. Negative for fracture or focal lesion. Sinuses/Orbits: No acute finding. Other: None. IMPRESSION: 1. No acute infarct or hemorrhage. 2. Subtle hyperdense area in the region of the cerebral aqueduct corresponds to the enhancing lesion seen on recent MRI, suspicious for malignancy. Electronically Signed   By: Randa Ngo M.D.   On: 10/02/2020 22:36   CT CHEST WO CONTRAST  Result Date: 10/04/2020 CLINICAL DATA:  87 year old male with history of recurrent non-Hodgkin's lymphoma. Staging examination. EXAM: CT CHEST WITHOUT CONTRAST TECHNIQUE: Multidetector CT imaging of the chest was performed following the standard protocol without IV contrast. COMPARISON:  CT the chest, abdomen and pelvis 07/23/2019. FINDINGS: Cardiovascular: Heart size is normal. There is no significant pericardial fluid, thickening or pericardial calcification. There is aortic atherosclerosis, as well as atherosclerosis of the great vessels of the mediastinum and the coronary arteries, including calcified atherosclerotic plaque in the left main, left anterior descending, left circumflex and right coronary arteries. Calcifications of the aortic valve and mild calcifications of the mitral annulus. Right internal jugular  single-lumen porta cath with tip terminating in the superior aspect of the right  atrium. Mediastinum/Nodes: No pathologically enlarged mediastinal or hilar lymph nodes. Please note that accurate exclusion of hilar adenopathy is limited on noncontrast CT scans. Esophagus is unremarkable in appearance. No axillary lymphadenopathy. Lungs/Pleura: No suspicious appearing pulmonary nodules or masses are noted. No acute consolidative airspace disease. No pleural effusions. Areas of chronic post infectious or inflammatory scarring are noted in the lung bases bilaterally (left greater than right). Upper Abdomen: Aortic atherosclerosis. 4.3 cm low-attenuation lesion in the anterior aspect of the interpolar region of the left kidney, incompletely characterized on today's non-contrast CT examination, but previously characterized as a simple cyst. Status post cholecystectomy. Musculoskeletal: There are no aggressive appearing lytic or blastic lesions noted in the visualized portions of the skeleton. IMPRESSION: 1. No lymphadenopathy or other findings to suggest recurrent lymphoma in the thorax. 2. No acute findings. 3. Aortic atherosclerosis, in addition to left main and 3 vessel coronary artery disease. 4. There are calcifications of the aortic valve and mitral annulus. Echocardiographic correlation for evaluation of potential valvular dysfunction may be warranted if clinically indicated. Aortic Atherosclerosis (ICD10-I70.0). Electronically Signed   By: Vinnie Langton M.D.   On: 10/04/2020 09:07   MR BRAIN WO CONTRAST  Result Date: 09/28/2020 CLINICAL DATA:  Stroke, follow up EXAM: MRI HEAD WITHOUT CONTRAST TECHNIQUE: Multiplanar, multiecho pulse sequences of the brain and surrounding structures were obtained without intravenous contrast. COMPARISON:  Most recent MRI is from 2013 FINDINGS: Brain: There is no acute infarction or intracranial hemorrhage. There is no intracranial mass or significant mass effect. There is no hydrocephalus or extra-axial fluid collection. There is abnormal T2  hyperintensity involving the dorsal midbrain, pons, and medulla. Possible slight effacement of cerebral aqueduct focally. Additional patchy and confluent areas of T2 hyperintensity in the supratentorial white matter are nonspecific but probably reflect chronic microvascular ischemic changes. Prominence of the ventricles and sulci reflects parenchymal volume loss. Vascular: Major vessel flow voids at the skull base are preserved. Skull and upper cervical spine: Normal marrow signal is preserved. Sinuses/Orbits: Minor mucosal thickening. Bilateral lens replacements. Other: Sella is unremarkable.  Mastoid air cells are clear. IMPRESSION: Abnormal signal involving the dorsal brainstem with possible minor mass effect. The location accounts for reported intractable vomiting. Differential is wide and includes toxic/metabolic, infectious/post infectious, demyelinating/inflammatory, and neoplastic etiologies. Postcontrast imaging is recommended. Electronically Signed   By: Macy Mis M.D.   On: 09/28/2020 15:09   MR BRAIN W CONTRAST  Result Date: 09/29/2020 CLINICAL DATA:  Follow-up from prior exam obtained 1 day ago EXAM: MRI HEAD WITH CONTRAST TECHNIQUE: Multiplanar, multiecho pulse sequences of the brain and surrounding structures were obtained with intravenous contrast. CONTRAST:  65mL GADAVIST GADOBUTROL 1 MMOL/ML IV SOLN COMPARISON:  Noncontrast brain MRI obtained 1 day prior FINDINGS: Brain: There is focal masslike enhancement in the region of the cerebral aqueduct measuring 0.9 cm AP by 0.9 cm TV by 0.9 cm cc corresponding to T2/FLAIR signal abnormality seen on the prior study. There is separate nodular enhancement along the anterior aspect of the fourth ventricle more inferiorly. There is nodular appearing enhancement of the cranial nerve 7/8 nerve complexes in the bilateral IAC's. There is abnormal enhancement of the right fifth cranial nerve. There is a homogeneously enhancing lesion along the lateral  aspect of the right tentorial leaflet measuring up to 0.8 cm by 0.5 cm corresponding to a calcified lesion present on prior head CTs, most likely reflecting a meningioma. Vascular:  The major blood vessels enhance normally. Skull and upper cervical spine: Normal marrow signal. Sinuses/Orbits: Grossly unremarkable. Other: None. IMPRESSION: Masslike enhancement in the region of the cerebral aqueduct measuring up to 0.9 cm, and nodular enhancement along the ventral aspect of fourth ventricle and multiple cranial nerves as above. 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 Recommend correlation with lumbar puncture. Electronically Signed   By: Valetta Mole M.D.   On: 09/29/2020 11:53   DG CHEST PORT 1 VIEW  Result Date: 10/01/2020 CLINICAL DATA:  Shortness of breath EXAM: PORTABLE CHEST 1 VIEW COMPARISON:  August 07, 2019 FINDINGS: The cardiomediastinal silhouette is unchanged in contour.RIGHT chest port tip terminating over the superior cavoatrial junction. RIGHT-sided skinfold. No pleural effusion. No pneumothorax. Unchanged LEFT basilar atelectasis/scar. No acute pleuroparenchymal abnormality. Visualized abdomen is unremarkable. Multilevel degenerative changes of the thoracic spine. IMPRESSION: LEFT basilar atelectasis Electronically Signed   By: Valentino Saxon M.D.   On: 10/01/2020 11:46    PERFORMANCE STATUS (ECOG) : 2 - Symptomatic, <50% confined to bed  Review of Systems Unless otherwise noted, a complete review of systems is negative.  Physical Exam General: NAD Extremities: generalized weakness Neurological: Weakness but otherwise nonfocal, hard of hearing, AAO x3  IMPRESSION:   Gary Yates is here today with his daughter. He is excited to have completed his last radiation treatment today. States he is going to have a celebratory lunch!   I saw patient initially during recent hospitalization at [facility]. Patient and family states he has been doing well since  discharge. He endorses good appetite. Daughter states he is mainly confined to wheelchair or recliner during the day. Family are all taking turns assisting in care. Gary Yates requires assistance with ADLs but is able to feed self. Family will push him to the bathroom in the wheelchair and provide stand by assistance toileting due to unsteady gait and high fall risk. Denies pain or dizziness.   Patient and family are appreciative of ability to tolerate radiation and good perceived quality of life.   PLAN: Patient will continue to do well at home with family assistance for ADLs.  No changes to medications as he denies pain. Some fatigue however sedentary given mainly chair bound.  I will plan to follow-up when he returns in a month unless other needs arise prior to then.    Patient and daughter expressed understanding and was in agreement with this plan. He also understands that He can call the clinic at any time with any questions, concerns, or complaints.     Time Total: 25 min.   Visit consisted of counseling and education dealing with the complex and emotionally intense issues of symptom management and palliative care in the setting of serious and potentially life-threatening illness.Greater than 50%  of this time was spent counseling and coordinating care related to the above assessment and plan.  Signed by: Alda Lea, AGPCNP-BC Palliative Medicine Team  Pager: [phone] Amion: Bjorn Pippin

## 2020-10-25 NOTE — Telephone Encounter (Signed)
This was sent in last week. I spoke with the patient's daughter. She is aware that this has been sent over.

## 2020-10-26 ENCOUNTER — Ambulatory Visit: Payer: Medicare Other

## 2020-10-27 ENCOUNTER — Ambulatory Visit: Payer: Medicare Other

## 2020-10-30 ENCOUNTER — Ambulatory Visit: Payer: Medicare Other

## 2020-10-31 ENCOUNTER — Ambulatory Visit: Payer: Medicare Other

## 2020-11-03 LAB — FUNGUS CULTURE RESULT

## 2020-11-03 LAB — FUNGAL ORGANISM REFLEX

## 2020-11-03 LAB — FUNGUS CULTURE WITH STAIN

## 2020-11-06 ENCOUNTER — Telehealth: Payer: Self-pay | Admitting: *Deleted

## 2020-11-06 NOTE — Telephone Encounter (Signed)
Received a call from Lattie Haw, patients daughter stating that patient has basically slept since Saturday and is barely eating or drinking.  Dr Marin Olp notified.  He would like to see the patient this week.  Lattie Haw verbalized that she is not sure she can get him up even with family assistance.  Will let me know tomorrow.  Palliative care consult made today to go see patient.  Requested this urgently .

## 2020-11-07 ENCOUNTER — Telehealth: Payer: Self-pay | Admitting: Nurse Practitioner

## 2020-11-07 ENCOUNTER — Inpatient Hospital Stay: Payer: Medicare Other | Attending: Hematology & Oncology

## 2020-11-07 ENCOUNTER — Telehealth: Payer: Self-pay | Admitting: *Deleted

## 2020-11-07 ENCOUNTER — Inpatient Hospital Stay (HOSPITAL_BASED_OUTPATIENT_CLINIC_OR_DEPARTMENT_OTHER): Payer: Medicare Other | Admitting: Hematology & Oncology

## 2020-11-07 ENCOUNTER — Other Ambulatory Visit: Payer: Self-pay

## 2020-11-07 ENCOUNTER — Inpatient Hospital Stay: Payer: Medicare Other

## 2020-11-07 ENCOUNTER — Other Ambulatory Visit: Payer: Self-pay | Admitting: *Deleted

## 2020-11-07 VITALS — BP 132/89 | HR 123 | Temp 98.5°F | Resp 24

## 2020-11-07 DIAGNOSIS — Z634 Disappearance and death of family member: Secondary | ICD-10-CM | POA: Diagnosis not present

## 2020-11-07 DIAGNOSIS — D500 Iron deficiency anemia secondary to blood loss (chronic): Secondary | ICD-10-CM | POA: Insufficient documentation

## 2020-11-07 DIAGNOSIS — N183 Chronic kidney disease, stage 3 unspecified: Secondary | ICD-10-CM

## 2020-11-07 DIAGNOSIS — Z515 Encounter for palliative care: Secondary | ICD-10-CM

## 2020-11-07 DIAGNOSIS — D631 Anemia in chronic kidney disease: Secondary | ICD-10-CM | POA: Insufficient documentation

## 2020-11-07 DIAGNOSIS — C8336 Diffuse large B-cell lymphoma, intrapelvic lymph nodes: Secondary | ICD-10-CM | POA: Diagnosis not present

## 2020-11-07 DIAGNOSIS — C8589 Other specified types of non-Hodgkin lymphoma, extranodal and solid organ sites: Secondary | ICD-10-CM

## 2020-11-07 DIAGNOSIS — N189 Chronic kidney disease, unspecified: Secondary | ICD-10-CM | POA: Insufficient documentation

## 2020-11-07 LAB — COMPREHENSIVE METABOLIC PANEL
ALT: 7 U/L (ref 0–44)
AST: 24 U/L (ref 15–41)
Albumin: 3.3 g/dL — ABNORMAL LOW (ref 3.5–5.0)
Alkaline Phosphatase: 91 U/L (ref 38–126)
Anion gap: 17 — ABNORMAL HIGH (ref 5–15)
BUN: 70 mg/dL — ABNORMAL HIGH (ref 8–23)
CO2: 25 mmol/L (ref 22–32)
Calcium: 9.9 mg/dL (ref 8.9–10.3)
Chloride: 99 mmol/L (ref 98–111)
Creatinine, Ser: 3.02 mg/dL (ref 0.61–1.24)
GFR, Estimated: 20 mL/min — ABNORMAL LOW (ref >60)
Glucose, Bld: 230 mg/dL — ABNORMAL HIGH (ref 70–99)
Potassium: 3.9 mmol/L (ref 3.5–5.1)
Sodium: 141 mmol/L (ref 135–145)
Total Bilirubin: 1 mg/dL (ref 0.3–1.2)
Total Protein: 7.1 g/dL (ref 6.5–8.1)

## 2020-11-07 LAB — CBC WITH DIFFERENTIAL (CANCER CENTER ONLY)
Abs Immature Granulocytes: 0.09 10*3/uL — ABNORMAL HIGH (ref 0.00–0.07)
Basophils Absolute: 0 10*3/uL (ref 0.0–0.1)
Basophils Relative: 0 %
Eosinophils Absolute: 0 10*3/uL (ref 0.0–0.5)
Eosinophils Relative: 0 %
HCT: 40.9 % (ref 39.0–52.0)
Hemoglobin: 13.5 g/dL (ref 13.0–17.0)
Immature Granulocytes: 1 %
Lymphocytes Relative: 17 %
Lymphs Abs: 1.8 10*3/uL (ref 0.7–4.0)
MCH: 31.4 pg (ref 26.0–34.0)
MCHC: 33 g/dL (ref 30.0–36.0)
MCV: 95.1 fL (ref 80.0–100.0)
Monocytes Absolute: 0.2 10*3/uL (ref 0.1–1.0)
Monocytes Relative: 2 %
Neutro Abs: 8.2 10*3/uL — ABNORMAL HIGH (ref 1.7–7.7)
Neutrophils Relative %: 80 %
Platelet Count: 193 10*3/uL (ref 150–400)
RBC: 4.3 MIL/uL (ref 4.22–5.81)
RDW: 15.2 % (ref 11.5–15.5)
WBC Count: 10.4 10*3/uL (ref 4.0–10.5)
nRBC: 0 % (ref 0.0–0.2)

## 2020-11-07 MED ORDER — HEPARIN SOD (PORK) LOCK FLUSH 100 UNIT/ML IV SOLN
500.0000 [IU] | Freq: Once | INTRAVENOUS | Status: AC
  Administered 2020-11-07: 500 [IU] via INTRAVENOUS

## 2020-11-07 MED ORDER — SODIUM CHLORIDE 0.9% FLUSH
10.0000 mL | INTRAVENOUS | Status: AC | PRN
  Administered 2020-11-07: 10 mL via INTRAVENOUS

## 2020-11-07 NOTE — Telephone Encounter (Signed)
Urgent hospice referral made to [place].  Orders placed.  The urgency of the situation relayed to Hospice and they understand the situation.

## 2020-11-07 NOTE — Telephone Encounter (Signed)
Attempted to contact patient's daughter, [name], to offer to schedule an In-home Palliative Consult, no answer - left message with reason for call along with my name and call back number.

## 2020-11-07 NOTE — Progress Notes (Signed)
Hematology and Oncology Follow Up Visit  Gary Yates [id number] 24-Sep-1933 87 y.o. 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  It is clear and quite unfortunate that he is declining quickly.  He had a relapse of his lymphoma into the brain.  This was incredibly shocking since it is been probably 3 years or so since he has had treatment.  He finally had a positive lumbar puncture.  He had an MRI which was consistent with CNS relapse of his lymphoma.  He is not walking.  He is not eating.  He is clear that he is declining quickly.  He really looks a lot older now.  I had a long talk with his 2 daughters.  He definitely is a Hospice candidate.  I know they are interested in Palliative  Care.  I think is beyond this now.  He is not able to talk to me.  He looks incredibly dehydrated.  He was having some involuntary movements over on his left side.  I just do not want to see him like this.  I really believe that hospice is the best way to help him.  He could easily be a candidate for [employer].  We will have Hospice come and see him tomorrow and talk with his family.  I know he is trying his best.  He really has done a great job from my point of view.  I really never thought that the lymphoma would come back since he had a relatively long remission..  I really thought that he would have cardiac  issues.  I know that his wife passed away in 04-09-22.  This is been tough on him.  Again, he really is not eating.  I be worried about him aspirating and in reality.  I just want to make sure that his quality life and comfort is the goal.  He has a indwelling Foley catheter which I think is helping.  He has had no obvious pain issues.  Son for an unscheduled visit.  He has not been feeling well at all.  He says he never felt well for about a month.  Over the past week or so he has been getting a little bit worse.  He saw his family doctor earlier this week.  He has some lab work done.  Medications:  Allergies as of 11/07/2020       Reactions   Latex Other (See Comments)   Reddens the skin   Ace Inhibitors Cough   Codeine Nausea Only, Rash      Compazine [prochlorperazine] Anxiety   Penicillins Rash   Childhood allergy Has patient had a PCN reaction causing immediate rash, facial/tongue/throat swelling, SOB or lightheadedness with hypotension: Yes Has patient had a PCN reaction causing severe rash involving mucus membranes or skin necrosis: Yes Has patient had a PCN reaction that required hospitalization  No Has patient had a PCN reaction occurring within the last 10 years: No If all of the above answers are "NO", then may proceed with Cephalosporin use.        Medication List        Accurate as of November 07, 2020  5:22 PM. If you have any questions, ask your nurse or doctor.          Accu-Chek Aviva Plus test strip Generic drug: glucose blood Test 2 times a day.   Accu-Chek Guide test strip Generic drug: glucose blood Use to test blood sugars up to 4 times a day.   Accu-Chek Guide w/Device Kit Use to test blood sugars up to 4 times daily.   Accu-Chek Softclix Lancets lancets Use to test blood sugars up to 4 times a day.   acetaminophen 500 MG tablet Commonly known as: TYLENOL Take 1,000 mg by mouth every 6 (six) hours as needed for moderate pain.    atorvastatin 40 MG tablet Commonly known as: LIPITOR TAKE 1 TABLET BY MOUTH EVERY DAY   chlorhexidine 0.12 % solution Commonly known as: PERIDEX Use as directed 15 mLs in the mouth or throat 2 (two) times daily as needed (mouth pain).   dexamethasone 6 MG tablet Commonly known as: DECADRON Take 1 tablet (6 mg total) by mouth daily.   doxycycline 100 MG tablet Commonly known as: VIBRA-TABS Take 1 tablet (100 mg total) by mouth every 12 (twelve) hours.   escitalopram 10 MG tablet Commonly known as: LEXAPRO TAKE 1/2 TABLET ONCE DAILY FOR ONE WEEK AND THEN INCREASE TO ONE DAILY.   furosemide 40 MG tablet Commonly known as: LASIX Take 40 mg by mouth daily.   gabapentin 300 MG capsule Commonly known as: NEURONTIN TAKE 2 CAPSULES BY MOUTH 3 TIMES A DAY   HumaLOG KwikPen 100 UNIT/ML KwikPen Generic drug: insulin lispro Before each meal 3 times a day, 140-199 - 2 units, 200-250 - 4 units, 251-299 - 6 units,  300-349 - 8 units,  350 or above 10 units.   levothyroxine 25 MCG tablet Commonly known as: SYNTHROID TAKE 1 TABLET BY MOUTH EVERY DAY   metFORMIN 500 MG tablet Commonly known as: GLUCOPHAGE Take 1 tablet (500 mg total) by mouth 2 (two) times daily with a meal.   nitroGLYCERIN 0.4 MG SL tablet Commonly known as: NITROSTAT Place 1 tablet (0.4 mg total) under the tongue every 5 (five) minutes as needed. Chest pain   ondansetron 4 MG tablet Commonly known as: Zofran Take 1 tablet (4 mg total) by mouth every 8 (eight) hours as needed for nausea.   pantoprazole 40 MG tablet Commonly known as: PROTONIX TAKE 1 TABLET BY MOUTH EVERY DAY   Pentips 32G X 4 MM Misc Generic drug: Insulin Pen Needle Use to inject insulin 3 to 4 times daily.   polyethylene glycol 17 g packet Commonly known as: MIRALAX / GLYCOLAX Take 17 g by mouth daily as needed for mild constipation.   potassium chloride SA 20 MEQ tablet Commonly known as: Klor-Con M20 Take 1 tablet (20 mEq total) by  mouth daily.   pramipexole 1.5 MG tablet Commonly known as: MIRAPEX TAKE 2 TABLETS BY MOUTH EVERY EVENING   vitamin B-12 1000 MCG tablet Commonly known as: CYANOCOBALAMIN Take 1,000 mcg by mouth daily.   warfarin 5 MG tablet Commonly known as: COUMADIN Take as directed by the anticoagulation clinic. If you are unsure how to take this medication, talk to your nurse or doctor. Original  instructions: Take 1 tablet daily or Take as directed by anticoagulation clinic        Allergies:  Allergies  Allergen Reactions   Latex Other (See Comments)    Reddens the skin   Ace Inhibitors Cough   Codeine Nausea Only and Rash        Compazine [Prochlorperazine] Anxiety   Penicillins Rash    Childhood allergy Has patient had a PCN reaction causing immediate rash, facial/tongue/throat swelling, SOB or lightheadedness with hypotension: Yes Has patient had a PCN reaction causing severe rash involving mucus membranes or skin necrosis: Yes Has patient had a PCN reaction that required hospitalization No Has patient had a PCN reaction occurring within the last 10 years: No If all of the above answers are "NO", then may proceed with Cephalosporin use.     Past Medical History, Surgical history, Social history, and Family History were reviewed and updated.  Review of Systems: Review of Systems  Constitutional:  Positive for malaise/fatigue and weight loss.  HENT: Negative.    Eyes: Negative.   Respiratory: Negative.    Cardiovascular:  Positive for palpitations.  Gastrointestinal:  Positive for nausea.  Genitourinary: Negative.   Musculoskeletal:  Positive for back pain.  Skin: Negative.   Neurological:  Positive for weakness and headaches.  Endo/Heme/Allergies: Negative.   Psychiatric/Behavioral:  Positive for memory loss.     Physical Exam:  vitals were not taken for this visit.   Wt Readings from Last 3 Encounters:  10/25/20 182 lb (82.6 kg)  10/10/20 190 lb 7.6 oz (86.4 kg)   09/12/20 198 lb 14.4 oz (90.2 kg)    Physical Exam Vitals reviewed.  Constitutional:      Comments: This is a chronically ill-appearing white male.  He is not talking to [place].  He has a flat affect.  He has some involuntary movements over on his left side.  HENT:     Head: Normocephalic and atraumatic.     Mouth/Throat:     Mouth: Mucous membranes are dry.  Eyes:     Pupils: Pupils are equal, round, and reactive to light.  Cardiovascular:     Rate and Rhythm: Normal rate and regular rhythm.     Heart sounds: Normal heart sounds.  Pulmonary:     Effort: Pulmonary effort is normal.     Breath sounds: Normal breath sounds.  Abdominal:     General: Bowel sounds are normal.     Palpations: Abdomen is soft.  Musculoskeletal:        General: No tenderness or deformity. Normal range of motion.     Cervical back: Normal range of motion.  Lymphadenopathy:     Cervical: No cervical adenopathy.  Skin:    General: Skin is warm and dry.     Findings: No erythema or rash.  Neurological:     Mental Status: He is oriented to person, place, and time.     Comments: Neurological exam shows some diffuse weakness.  He has these small movements over on his left side.  Again, is not talking to [place].  Psychiatric:        Behavior: Behavior normal.        Thought Content: Thought content normal.        Judgment: Judgment normal.    Lab Results  Component Value Date   WBC 10.4 11/07/2020   HGB 13.5 11/07/2020   HCT 40.9 11/07/2020   MCV 95.1 11/07/2020   PLT 193 11/07/2020   Lab Results  Component Value Date   FERRITIN 928 (H) 10/18/2020   IRON 62 10/18/2020   TIBC 228 (L) 10/18/2020   UIBC 166 10/18/2020   IRONPCTSAT 27 10/18/2020   Lab Results  Component Value Date   RETICCTPCT 1.6 10/18/2020   RBC 4.30 11/07/2020   No results found for: KPAFRELGTCHN, LAMBDASER, KAPLAMBRATIO No results found for: IGGSERUM, IGA, IGMSERUM No results found for: Kathrynn Ducking, MSPIKE, SPEI   Chemistry      Component Value Date/Time   NA 141 11/07/2020 1508   NA 137 08/18/2019 0000   NA 141 01/10/2017 1136   NA 137 12/14/2015 1100   K 3.9 11/07/2020 1508   K 4.4 01/10/2017 1136   K 3.7 12/14/2015 1100   CL 99 11/07/2020 1508   CL 104 01/10/2017 1136   CO2 25 11/07/2020 1508   CO2 27 01/10/2017 1136   CO2 24 12/14/2015 1100   BUN 70 (H) 11/07/2020 1508   BUN 29 (A) 08/18/2019 0000   BUN 19 01/10/2017 1136   BUN 18.4 12/14/2015 1100   CREATININE 3.02 (HH) 11/07/2020 1508   CREATININE 1.92 (H) 10/18/2020 1541   CREATININE 1.76 (H) 10/08/2019 0100   CREATININE 0.9 12/14/2015 1100   GLU 122 08/18/2019 0000      Component Value Date/Time   CALCIUM 9.9 11/07/2020 1508   CALCIUM 9.4 01/10/2017 1136   CALCIUM 9.5 12/14/2015 1100   ALKPHOS 91 11/07/2020 1508   ALKPHOS 112 (H) 01/10/2017 1136   ALKPHOS 102 12/14/2015 1100   AST 24 11/07/2020 1508   AST 23 10/18/2020 1541   AST 22 12/14/2015 1100   ALT 7 11/07/2020 1508   ALT 16 10/18/2020 1541   ALT 18 01/10/2017 1136   ALT 10 12/14/2015 1100   BILITOT 1.0 11/07/2020 1508   BILITOT 0.6 10/18/2020 1541   BILITOT 0.58 12/14/2015 1100       Impression and Plan: Gary Yates is an 87 yo caucasian male with history of relapsed large cell non-Hodgkin lymphoma of the testicle. He received salvage chemotherapy with Rituxan/bendamustine/Velcade.  He did not really respond well to this.  We then treated him with dose reduced R-ICE.  He responded very well to this.  His last PET scan in November 2019 showed that he was in remission.  Unfortunately, he came out of in September.  He had CNS relapse.  He declined significantly.  He really was not a candidate for any systemic therapy.  He received radiation therapy.  Unfortunately, I just think that he is declining more further.  I am unsure the radiation really helped.  He is a DO NOT RESUSCITATE.  I suspect that his prognosis is going to be  easily less than 1 month.  I really would like to make sure that he has quality of life and just does not exist.  He basically is in bed now.  It would not surprise me if he went to [facility].  Maybe, hospice when they come out to see him we will be able to help the family out with that transition.  I just hate the fact that Gary Yates has relapsed in the brain.  I know this is definitely difficult and can be notoriously hard to treat successfully.  I always did not recognize him when I came into the room.  I have known him for over 20 years.  I just think that his brain is starting to slow down and will eventually cause  him to go into a coma.  He has a strong faith.  His family has a strong faith.  I just want to make sure that he has respect, dignity and peace.  He served our country in [employer].  He is a true American hero.  For right now, we will have to see how he does with the Rocephin.  I will give him a dose of Toradol to try to help with a headache.  I will call in some ciprofloxacin for him.  I think this would be reasonable.   Hopefully, this will make him feel better.  It will be interesting to see what the urine culture has to show.  We will keep his regular appointment in late September.  Hopefully, he will not end up in the hospital.  I know that he has had problems in the past with urine infections.   Volanda Napoleon, MD 11/1/20225:22 PM

## 2020-11-07 NOTE — Addendum Note (Signed)
Addended by: San Morelle on: 11/07/2020 04:45 PM   Modules accepted: Orders, SmartSet

## 2020-11-08 NOTE — Telephone Encounter (Signed)
Ret'd call to patient's daughter, [name] as requested, to offer to schedule a Palliative Consult - no answer left message requesting return call.

## 2020-11-09 ENCOUNTER — Telehealth: Payer: Self-pay

## 2020-11-09 NOTE — Telephone Encounter (Signed)
Aaron Edelman from [facility] called stating he evaluated patient today and he has terminal restlessness, hallucinating, not eating or drinking, and has picking behavior. He states he spoke with the hospice doctor and he recommended haldol 5mg  every 4 hours as needed but wanted to run it by Dr.Ennever as well. Spoke with Dr. Marin Olp who is in agreement of plan.

## 2020-11-10 ENCOUNTER — Telehealth: Payer: Self-pay | Admitting: *Deleted

## 2020-11-13 ENCOUNTER — Encounter: Payer: Self-pay | Admitting: *Deleted

## 2020-11-13 ENCOUNTER — Ambulatory Visit: Payer: Medicare Other

## 2020-11-13 ENCOUNTER — Telehealth: Payer: Self-pay

## 2020-11-13 ENCOUNTER — Ambulatory Visit: Payer: Self-pay

## 2020-11-13 NOTE — Telephone Encounter (Signed)
Son of patient called to inform Dr. Elease Hashimoto that patient passed away on 2022/11/13

## 2020-11-13 NOTE — Telephone Encounter (Signed)
FYI

## 2020-11-13 NOTE — Progress Notes (Signed)
Flowers sent to family upon the passing of patient over the weekend per DR Marin Olp request

## 2020-11-27 ENCOUNTER — Ambulatory Visit: Payer: Self-pay | Admitting: Radiation Oncology

## 2020-11-27 ENCOUNTER — Encounter: Payer: Medicare Other | Admitting: Nurse Practitioner

## 2020-12-07 NOTE — Telephone Encounter (Signed)
No 11/07/20 LOS

## 2020-12-07 DEATH — deceased
